# Patient Record
Sex: Male | Born: 1935 | Race: White | Hispanic: No | Marital: Married | Smoking: Former smoker
Health system: Southern US, Community
[De-identification: ages and names within clinical notes are randomized; demographics above are authoritative.]

## PROBLEM LIST (undated history)

## (undated) DIAGNOSIS — I2510 Atherosclerotic heart disease of native coronary artery without angina pectoris: Secondary | ICD-10-CM

## (undated) DIAGNOSIS — J189 Pneumonia, unspecified organism: Secondary | ICD-10-CM

## (undated) DIAGNOSIS — Z952 Presence of prosthetic heart valve: Secondary | ICD-10-CM

## (undated) DIAGNOSIS — K5792 Diverticulitis of intestine, part unspecified, without perforation or abscess without bleeding: Secondary | ICD-10-CM

## (undated) DIAGNOSIS — Z992 Dependence on renal dialysis: Secondary | ICD-10-CM

## (undated) DIAGNOSIS — Z9981 Dependence on supplemental oxygen: Secondary | ICD-10-CM

## (undated) DIAGNOSIS — I5032 Chronic diastolic (congestive) heart failure: Secondary | ICD-10-CM

## (undated) DIAGNOSIS — N186 End stage renal disease: Secondary | ICD-10-CM

## (undated) DIAGNOSIS — M1990 Unspecified osteoarthritis, unspecified site: Secondary | ICD-10-CM

## (undated) DIAGNOSIS — F419 Anxiety disorder, unspecified: Secondary | ICD-10-CM

## (undated) DIAGNOSIS — Z9289 Personal history of other medical treatment: Secondary | ICD-10-CM

## (undated) DIAGNOSIS — D649 Anemia, unspecified: Secondary | ICD-10-CM

## (undated) DIAGNOSIS — J9620 Acute and chronic respiratory failure, unspecified whether with hypoxia or hypercapnia: Secondary | ICD-10-CM

## (undated) DIAGNOSIS — Z8719 Personal history of other diseases of the digestive system: Secondary | ICD-10-CM

## (undated) DIAGNOSIS — I739 Peripheral vascular disease, unspecified: Secondary | ICD-10-CM

## (undated) DIAGNOSIS — I639 Cerebral infarction, unspecified: Secondary | ICD-10-CM

## (undated) DIAGNOSIS — IMO0001 Reserved for inherently not codable concepts without codable children: Secondary | ICD-10-CM

## (undated) DIAGNOSIS — E785 Hyperlipidemia, unspecified: Secondary | ICD-10-CM

## (undated) DIAGNOSIS — I4891 Unspecified atrial fibrillation: Secondary | ICD-10-CM

## (undated) DIAGNOSIS — J449 Chronic obstructive pulmonary disease, unspecified: Secondary | ICD-10-CM

## (undated) DIAGNOSIS — E7800 Pure hypercholesterolemia, unspecified: Secondary | ICD-10-CM

## (undated) DIAGNOSIS — I10 Essential (primary) hypertension: Secondary | ICD-10-CM

## (undated) DIAGNOSIS — J111 Influenza due to unidentified influenza virus with other respiratory manifestations: Secondary | ICD-10-CM

## (undated) DIAGNOSIS — I482 Chronic atrial fibrillation, unspecified: Secondary | ICD-10-CM

## (undated) DIAGNOSIS — Z8673 Personal history of transient ischemic attack (TIA), and cerebral infarction without residual deficits: Secondary | ICD-10-CM

## (undated) DIAGNOSIS — I509 Heart failure, unspecified: Secondary | ICD-10-CM

## (undated) DIAGNOSIS — I350 Nonrheumatic aortic (valve) stenosis: Secondary | ICD-10-CM

## (undated) HISTORY — PX: INGUINAL HERNIA REPAIR: SUR1180

## (undated) HISTORY — DX: Unspecified osteoarthritis, unspecified site: M19.90

## (undated) HISTORY — DX: Pneumonia, unspecified organism: J18.9

## (undated) HISTORY — DX: Cerebral infarction, unspecified: I63.9

## (undated) HISTORY — DX: Atherosclerotic heart disease of native coronary artery without angina pectoris: I25.10

## (undated) HISTORY — DX: Acute and chronic respiratory failure, unspecified whether with hypoxia or hypercapnia: J96.20

## (undated) HISTORY — DX: Chronic obstructive pulmonary disease, unspecified: J44.9

## (undated) HISTORY — DX: Unspecified atrial fibrillation: I48.91

## (undated) HISTORY — DX: Essential (primary) hypertension: I10

## (undated) HISTORY — PX: CATARACT EXTRACTION W/ INTRAOCULAR LENS  IMPLANT, BILATERAL: SHX1307

## (undated) HISTORY — PX: EYE SURGERY: SHX253

## (undated) HISTORY — DX: Pure hypercholesterolemia, unspecified: E78.00

## (undated) HISTORY — DX: Hyperlipidemia, unspecified: E78.5

---
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
## 1982-06-22 HISTORY — PX: APPENDECTOMY: SHX54

## 1997-06-22 DIAGNOSIS — I2510 Atherosclerotic heart disease of native coronary artery without angina pectoris: Secondary | ICD-10-CM

## 1997-06-22 HISTORY — PX: CORONARY ANGIOPLASTY WITH STENT PLACEMENT: SHX49

## 1997-06-22 HISTORY — DX: Atherosclerotic heart disease of native coronary artery without angina pectoris: I25.10

## 2010-06-22 DIAGNOSIS — Z8673 Personal history of transient ischemic attack (TIA), and cerebral infarction without residual deficits: Secondary | ICD-10-CM

## 2010-06-22 HISTORY — PX: COLECTOMY: SHX59

## 2010-06-22 HISTORY — DX: Personal history of transient ischemic attack (TIA), and cerebral infarction without residual deficits: Z86.73

## 2012-10-19 ENCOUNTER — Other Ambulatory Visit: Payer: Self-pay | Admitting: *Deleted

## 2012-10-19 ENCOUNTER — Inpatient Hospital Stay
Admission: RE | Admit: 2012-10-19 | Discharge: 2012-11-07 | Disposition: A | Discharge status: Home / Self Care | Payer: Medicare PPO | Source: Ambulatory Visit | Attending: Internal Medicine | Admitting: Internal Medicine

## 2012-10-19 MED ORDER — ALPRAZOLAM 0.25 MG PO TABS: ORAL_TABLET | ORAL | Status: DC

## 2012-10-20 ENCOUNTER — Non-Acute Institutional Stay (SKILLED_NURSING_FACILITY): Payer: Medicare PPO | Admitting: Internal Medicine

## 2012-10-20 DIAGNOSIS — J441 Chronic obstructive pulmonary disease with (acute) exacerbation: Secondary | ICD-10-CM

## 2012-10-20 DIAGNOSIS — J9620 Acute and chronic respiratory failure, unspecified whether with hypoxia or hypercapnia: Secondary | ICD-10-CM

## 2012-10-20 DIAGNOSIS — E785 Hyperlipidemia, unspecified: Secondary | ICD-10-CM

## 2012-10-20 DIAGNOSIS — I25810 Atherosclerosis of coronary artery bypass graft(s) without angina pectoris: Secondary | ICD-10-CM

## 2012-10-20 DIAGNOSIS — K5732 Diverticulitis of large intestine without perforation or abscess without bleeding: Secondary | ICD-10-CM

## 2012-10-20 DIAGNOSIS — I10 Essential (primary) hypertension: Secondary | ICD-10-CM

## 2012-10-20 DIAGNOSIS — M1990 Unspecified osteoarthritis, unspecified site: Secondary | ICD-10-CM

## 2012-10-20 DIAGNOSIS — N189 Chronic kidney disease, unspecified: Secondary | ICD-10-CM

## 2012-10-22 DIAGNOSIS — I25810 Atherosclerosis of coronary artery bypass graft(s) without angina pectoris: Secondary | ICD-10-CM | POA: Insufficient documentation

## 2012-10-22 DIAGNOSIS — E785 Hyperlipidemia, unspecified: Secondary | ICD-10-CM | POA: Insufficient documentation

## 2012-10-22 DIAGNOSIS — J449 Chronic obstructive pulmonary disease, unspecified: Secondary | ICD-10-CM | POA: Insufficient documentation

## 2012-10-22 DIAGNOSIS — I10 Essential (primary) hypertension: Secondary | ICD-10-CM | POA: Insufficient documentation

## 2012-10-22 DIAGNOSIS — M1990 Unspecified osteoarthritis, unspecified site: Secondary | ICD-10-CM

## 2012-10-22 DIAGNOSIS — N184 Chronic kidney disease, stage 4 (severe): Secondary | ICD-10-CM | POA: Insufficient documentation

## 2012-10-22 HISTORY — DX: Unspecified osteoarthritis, unspecified site: M19.90

## 2012-10-22 NOTE — Progress Notes (Signed)
Patient ID: Terry [name], male   DOB: 1935-02-15, 78 y.o.   MRN: [id number]  Chief complaint-acute visit status post hospitalization for acute respiratory failure-COPD exasperation-pneumonia.  History of present illness.  Patient is an elderly resident who presented to the hospital with increased shortness of breath and cough with chest tightness he also was noted to have some expectoration.  He was placed on antibiotic for possible left lower lobe pneumonia and admitted to the hospital.  He was treated with Levaquin.  However shortly after his admission he developed increased respiratory distress and hypercarbic respiratory failure and subsequently required transfer to the intensive care unit and BiPAP ventilation.  His symptoms improved he was weaned off BiPAP and placed on supplemental oxygen.  He was noted to have signs and symptoms of obstructive sleep apnea-recommendation for outpatient evaluation for CPAP therapy he did improve during his hospitalization and was switched to by mouth antibiotics.  He also was noted to be in acute renal failure with a creatinine of 1.79 and BUN of 25-he received IV fluids with gentle hydration-renal ultrasound was negative for obstructive etiology-patient's kidneys were consistent with severe medical renal disease and bilateral renal cysts.  Nephrology recommended conservative management and his creatinine did improve to 1.5 at discharge which appears to be his baseline.  He was also noted to have some obstructive uropathy and was started on Cardura while in the hospital-this should also help with his hypertension.  Patient was hospitalized in [place] but his wife has just taken a job in [place] and thus he is admitted to a rehabilitation facility closer to his wife  He does have a history of coronary artery disease and had a stent placed in 1998-this was stable during hospitalization he had an echocardiogram done which showed an ejection fraction  of 55-60% with normal diastolic function.  . Past medical history.  History of acute respiratory failure improved.  COPD with exasperation.  Sleep apnea suspected.  Acute renal failure complicated with chronic renal disease.  Coronary artery disease status post.  History of diverticulosis and diverticulitis status post sigmoid colectomy.  Hyperlipidemia.  History TIAs.  Osteoarthritis.  Operative procedures during hospitalization.  #1-intubation and mechanical ventilation.  2 CT scan of the chest without contrast-I did not see those results although apparently did not show any acute pathology.  #3-ultrasound of the kidneys bilateral negative for obstructive nephropathy.  Past surgical history.  Significant for status post sigmoid colectomy secondary to diverticulitis and diverticulosis.-- 2012  History appendectomy.  History of angioplasty with 2 stents in 1999  Medications.  Albuterol inhaler 2 puffs 4 times a day.  Xanax 0.25 mg twice a day when necessary anxiety.  Norvasc 10 mg daily.  Lipitor 10 mg each bedtime.  Doxazosin 4 mg each bedtime.  Advair 2 puffs twice a day.  [employer] 600 mg Mucinex 600 mg 3 times a day when necessary.  Hydrochlorothiazide 25 mg daily.  Lopressor 20 mg daily.  Terazosin 75 mg daily.  Spiriva one capsule inhaled daily  . Social history-patient is married he does have one daughter and one stepdaughter-his wife recently was transferred to [facility] from [place] is a retired Merchandiser, retail.  Does have a tobacco history--he smoked for 49 years he quit in 2001---denies significant alcohol use or illicit drug use.  Family history.  Father is deceased he did have stomach trouble and died of an MI at age 65.  Mother deceased at age 51 had cancer although unclear what type this was.  He has 2 brothers one has Parkinson's syndrome  he also has a sister with a history of cancer  Review of  systems.  General denies any fever or chills.  Skin denies issues.  Eyes denies any visual changes.  Ear nose mouth and throat-does not complain of sore throat nasal discharge or hearing difficulties.  Respiratory-extensive history as noted above but does not complaining of any increased shortness of breath from baseline is on oxygen.  Cardiac-denies any chest pain.  GU does not complaining of dysuria.  Muscle skeletal-has some weakness but denies any joint pain at this time.  Neurologic-does not complain of dizziness or headache.  Psych-does not complaining of depression does have some history of anxiety.  Physical exam.  Temperature 98.0 pulse 94 respirations 20 blood pressure 150/70-149/76-O2 saturation in the 90s on oxygen.  In general this is a frail elderly male in no distress lying comfortably in bed.  The skin is warm and dry he does have some violaceous bruising of his abdomen.  Eyes pupils appear equal round react to light sclera and conjunctiva are clear.  Oropharynx is clear mucous membranes moist he does have numerous extractions.  Chest he has reduced breath sounds but no rhonchi rales or wheezes no labored breathing.  Heart distant heart sounds radial pulses regular he continues with  edema 2+ lower extremity edema positive pedal pulses  Abdomen is protuberant soft nontender there is a well-healed surgical scar bowel sounds are positive.  Muscle skeletal moves all extremities x4 actually is able to ambulate I do not note any deformities.  Neurologic is grossly intact cranial nerves intact speech is clear I do not see any lateralizing findings.  Psychiatric alert and oriented x3 pleasant and appropriate.  Labs.  10/16/2012.  WBC 13.7 hemoglobin 10.3 platelets 199.  Sodium 136 potassium 3.9 BUN 37 creatinine 1.59.  Albumin 3.1 on 10/07/2012.  25th 2014.  WBC 15.1 hemoglobin 10.2 platelets 2:30.  Assessment and plan.  #1-history respiratory  failure with COPD-at this point appear stable he is on a significant amount of oxygen--nonetheless clinically he appears to be relatively stable he continues on inhalers-also Mucinex-he is motivated to go home and is up and walking.  #2-history of chronic kidney disease-most recent creatinine actually shows improvement will update a metabolic panel first [facility] next week.  #3-hypertension at this point systolic somewhat elevated but we do not have a whole lot of readings we'll continue to monitor for now he is on Norvasc as well as hydrochlorothiazide and metoprolol--as well as Cardura.  #4-history of obstructive uropathy-he is on Cardura this was started in the hospital I note he is also on Hytrin Will DC the Hytrin secondary to duplicate therapy.  #5-hyperlipidemia-continues on a statin since his stay here is going to be relatively short I suspect will not be aggressive and defer to primary care provider to follow his lipid panel.  #6-anxiety-at this point appear stable he does have Ativan when necessary twice a day  #7.  Coronary artery disease-at this point appears asymptomatic he is on a statin at also continues on Cardura metoprolol hydrochlorothiazide as well as Norvasc for blood pressure control--- question starting anticoagulant-Will await Dr. Janalyn Rouse input.  #8-history of diverticulitsi--this appears to be stable  #9-anemia-will update CBC the first lab day next week  #10 osteoarthritis-at this point appears to be stable he does not complain of joint pain today  [id number]-of note greater than 50 minutes spent assessing patient-review of hospital records-and formulating plan of care for  numerous diagnoses

## 2012-10-24 ENCOUNTER — Non-Acute Institutional Stay (SKILLED_NURSING_FACILITY): Payer: Medicare PPO | Admitting: Internal Medicine

## 2012-10-24 DIAGNOSIS — J441 Chronic obstructive pulmonary disease with (acute) exacerbation: Secondary | ICD-10-CM

## 2012-10-24 DIAGNOSIS — I10 Essential (primary) hypertension: Secondary | ICD-10-CM

## 2012-10-24 DIAGNOSIS — I509 Heart failure, unspecified: Secondary | ICD-10-CM

## 2012-10-24 DIAGNOSIS — N189 Chronic kidney disease, unspecified: Secondary | ICD-10-CM

## 2012-10-24 NOTE — Progress Notes (Signed)
Patient ID: Terry [name], male   DOB: 1935/05/31, 78 y.o.   MRN: [id number] Chief complaint; this is an admission to SNF close a hospital [facility] apparently a branch of [facility]. Patient was in acute care from 10/07/2012 through 10/18/2012  History; patient was admitted for treatment of community-acquired pneumonia. On the day of admission, he developed increasing respiratory distress and needed to be transferred to the ICU for BiPAP. He was continued on BiPAP overnight and continued in the ICU for 48 hours. His respiratory status stabilized. He was weaned off BiPAP and placed on supplemental oxygen. He has known COPD and was also felt to have symptoms and signs of obstructive sleep apnea. It was suggested he have an outpatient polysomnogram. His pneumonia was in the left lower lobe and he is discharged on Levaquin. Other medical issues in the hospital included stable coronary artery disease, status post stenting in 1998. Echocardiogram showed an ejection fraction of 55-60% with normal diastolic function. The patient did not have any bowel abnormalities. The patient also developed acute renal failure in the hospital and his initial BUN and creatinine were noted to be 25 and 1.7. At discharge. This was 37 and 1.5, respectively. He was felt to require a stay in SNF due to deconditioning.  Past medical history; #1 status post acute respiratory failure secondary to hypercarbic respiratory failure. Problem #2 COPD exacerbation. #3 left lobe pneumonia. #4 acute renal failure. #5 chronic renal failure stage II. #6 hyponatremia resolved. #7 coronary artery disease, status post stent remotely. #8 history of diverticulosis, closes and diverticulitis, status post sigmoid colectomy. #9 hyperlipidemia #10. Transient ischemic attacks #11. Osteoarthritis  Medications; albuterol 90 mcg 2 puffs 4 times a day, amlodipine 10 by mouth daily, atorvastatin 10 one by mouth each bedtime, doxazosin  4 mg, 1 by mouth each bedtime, Advair HFA 115/21 2 puffs twice a day, hydrochlorothiazide, 25 one by mouth daily, metoprolol XL 100, one by mouth daily, terazosin 5 mg, one by mouth daily, Spiriva 18 mcg 1 tab daily, O2 5 L per minute via nasal cannula.  Social history; patient states he was not on home O2 prior to this which seems in contradistinction to what is said in the discharge summary. He is a remote smoker. Her laboratory assist device. His family is read locating to the [place] area, therefore, he came here for rehabilitation. Family history none according the patient.  Review of systems; Respiratory shortness of breath on exertion no cough HEENT complains of oral pain due to thrush, which was treated in the hospital. Cardiac no exertional chest pain, but notes increasing edema. Abdomen no abnormal pain, nausea, vomiting, or dysphagia.  Physical exam, temperature 90.7, 8, pulse 76, rests 18, BP 122/66. O2 sat is 98%. Weight is 189 pounds, 191.2 on admission  HEENT I see no evidence of thrush although his tongue is sensitive, no lesions seen. Respiratory slight stridor, but no wheezing. Air entry is reduced. Cardiac heart sounds are normal. No murmurs JVP. PE is elevated, 2+ to 3+ edema below his knees 3+ coccyx edema. Abdomen distended. No tenderness. No masses. No liver no spleen. No shifting dominance. Extremities 2-3+ pitting edema below the knees. No signs of a DVT.  Impression/problem. #1 at least right-sided heart heart failure, and echocardiogram as noted, showing an EF of 55-60% with normal diastolic function. There was no valve abnormalities. No "it right-sided pressures, however. #2 chronic renal failure. Labs today show a BUN of 26 and a creatinine of 1.89  vs. 37 and 1.5 at discharge. The latter was felt to be his discharge values #3 hypertension, which was felt to be stable. #4 coronary artery disease, not felt to be unstable. No current complaints. #5 COPD with  component of possible obstructive. He was discharged on high flow oxygen, which he currently probably does not need. [place] as stated in the discharge summary that he was on home O2 , The patient does not verify this. I will taper his oxygen and get a blood gas. He tells me he recently had PFTs done in [place]. As his grafts in the last month to 2. #6 BPH started on Cardura. #7 very significant lower extremity and coccyx edema, I think this is right-sided heart failure. I will substitute Lasix for the thiazide. #8 hyponatremia. This will need to be followed #9 acute on chronic respiratory failure, which at least one part of the discharge summary says intubation whereas the verbiage suggest he only received BiPAP. In any case, felt to be secondary to COPD and possibly obstructive sleep apnea. #10. Question obstructive sleep apnea, a outpatient polysomnogram was suggested  I am going to taper his oxygen, start him on Lasix, follow his renal function. He will need a blood gas at some point. It would be nice to see his PFTs, which were apparently only recently done.

## 2012-10-31 ENCOUNTER — Non-Acute Institutional Stay (SKILLED_NURSING_FACILITY): Payer: Medicare PPO | Admitting: Internal Medicine

## 2012-10-31 DIAGNOSIS — I509 Heart failure, unspecified: Secondary | ICD-10-CM

## 2012-10-31 DIAGNOSIS — J441 Chronic obstructive pulmonary disease with (acute) exacerbation: Secondary | ICD-10-CM

## 2012-10-31 NOTE — Progress Notes (Signed)
Patient ID: Terry [name], male   DOB: 11/25/1934, 78 y.o.   MRN: [id number] Chief complaint; review of hypoxemia, chronic renal insufficiency.  History; the patient came to [place] from admission to a hospital in [facility]. He was admitted to the ICU for pneumonia a period required either BiPAP or, invitation I'm not exactly sure, however, most of the records seems to say , BiPAP. He came to [place] on high flow oxygen at 5 L per minute. Although the discharge summary is not definitive on this, the patient states that he was not on home O2. Apparently, with activity he desaturates therefore, we have not been able to wean his oxygen. He is listed as having both COPD and probable obstructive sleep apnea. He is on albuterol inhaler and Advair HFA.[name] the patient states he feels fine with no shortness of breath or cough. He apparently recently had PFTs done by a pulmonologist in [place], Dr. Rosana Hoes  Review of systems;  Respiratory exertional shortness of breath But no cough no wheezing. Cardiac no exertional chest pain. GI no abdominal pain, nausea, or vomiting.  Physical exam; at rest. His sats were 99% on 5 L. Pulse rate 96, respirations 18, with activity, probably 70 yards of walking at a slow pace.[name] He got down to 93% on 5 L. He was tachycardic in the 130s, I suspect that this is his maximal exercise tolerance.  Respiratory reasonably clear entry bilaterally. No wheezing. Cardiac heart sounds are normal. Much less edema and he has lost 8 pounds in a week since starting Lasix 20.  Abdomen no tenderness. No mass.  Impression/plan Hypoxemia felt in hospital to be secondary to COPD, and possibly obstructive sleep apnea. I am going to taper the oxygen. I suspect he has about a 70 your exercise tolerance. Like to do a blood gas on him and I would like to see his recently done PFTs, which I will request. #2 at least right-sided heart failure. He had an echocardiogram done in the hospital, but I did not  actually see the numbers, especially the right-sided members. His JVP was elevated in the 3+ edema to below his knees. On arrival.

## 2012-11-02 ENCOUNTER — Non-Acute Institutional Stay (SKILLED_NURSING_FACILITY): Payer: Medicare PPO | Admitting: Internal Medicine

## 2012-11-02 DIAGNOSIS — J441 Chronic obstructive pulmonary disease with (acute) exacerbation: Secondary | ICD-10-CM

## 2012-11-02 DIAGNOSIS — I509 Heart failure, unspecified: Secondary | ICD-10-CM

## 2012-11-02 NOTE — Progress Notes (Signed)
Patient ID: Terry [name], male   DOB: 11/11/1934, 78 y.o.   MRN: [id number]                  Patient ID: Terry [name], male   DOB: [birth year], 78 y.o.   MRN: [id number] Chief complaint; review of hypoxemia, chronic renal insufficiency.  History; the patient came to [place] from admission to a hospital in [facility]. He was admitted to the ICU for pneumonia and for a  period required either BiPAP or intubation, I'm not exactly sure, however, most of the records seems to say , BiPAP. He came to [place] on high flow oxygen at 5 L per minute. Although the discharge summary is not definitive on this, the patient states that he was not on home O2. Apparently, with activity he desaturates therefore, we have not been able to wean his oxygen. He is listed as having both COPD and probable obstructive sleep apnea. He is on albuterol inhaler and Advair HFA.[name] the patient states he feels fine with no shortness of breath or cough. He apparently recently had PFTs done by a pulmonologist in [place], Dr. Rosana Hoes. Last visit I ambulated him about 70 yards, he did well. We have now tapered his O2 to off. He sats at 93% on room air, 88% on room air with about 70 yards of walking a a slow pace.    Review of systems;   Respiratory exertional shortness of breath But no cough no wheezing. Cardiac no exertional chest pain. GI no abdominal pain, nausea, or vomiting.  Physical exam; at rest. His sats were 93% on room air. Pulse rate 96, respirations 18, with activity, probably 70 yards of walking at a slow pace.[name] He got down to 88% on room air. He was tachycardic in the 130s, I suspect that this is his maximal exercise tolerance.   Respiratory reasonably clear entry bilaterally. No wheezing. Cardiac heart sounds are normal. Much less edema and he has lost 8 pounds in a week since starting Lasix 20.     Impression/plan Hypoxemia felt in hospital to be secondary to COPD, and possibly obstructive sleep apnea. I have tapered  the oxygen. I suspect he has about a 70 your exercise tolerance. I have ordered a blood gas on room air. He has already had PFT's done in [place].  #2 at least right-sided heart failure. He had an echocardiogram done in the hospital, but I did not actually see the numbers, especially the right-sided members. His JVP was elevated in the 3+ edema to below his knees. On arrival. This is all much better. #3 ?sleep apnea: out patient sleep study will be ordered.   I don't believe he will need chronic O2 at this point. Suspect he has pulmary hypertension. Blood gas on room air. Hopefully can review PFT's

## 2012-11-03 ENCOUNTER — Ambulatory Visit (HOSPITAL_COMMUNITY)
Admit: 2012-11-03 | Discharge: 2012-11-03 | Disposition: A | Discharge status: Home / Self Care | Payer: Medicare PPO | Source: Ambulatory Visit | Attending: Internal Medicine | Admitting: Internal Medicine

## 2012-11-03 ENCOUNTER — Non-Acute Institutional Stay (SKILLED_NURSING_FACILITY): Payer: Medicare PPO | Admitting: Internal Medicine

## 2012-11-03 DIAGNOSIS — I10 Essential (primary) hypertension: Secondary | ICD-10-CM

## 2012-11-03 DIAGNOSIS — J449 Chronic obstructive pulmonary disease, unspecified: Secondary | ICD-10-CM | POA: Insufficient documentation

## 2012-11-03 DIAGNOSIS — R0609 Other forms of dyspnea: Secondary | ICD-10-CM | POA: Insufficient documentation

## 2012-11-03 DIAGNOSIS — M1990 Unspecified osteoarthritis, unspecified site: Secondary | ICD-10-CM

## 2012-11-03 DIAGNOSIS — R05 Cough: Secondary | ICD-10-CM

## 2012-11-03 DIAGNOSIS — N189 Chronic kidney disease, unspecified: Secondary | ICD-10-CM

## 2012-11-03 DIAGNOSIS — E785 Hyperlipidemia, unspecified: Secondary | ICD-10-CM

## 2012-11-03 DIAGNOSIS — J9620 Acute and chronic respiratory failure, unspecified whether with hypoxia or hypercapnia: Secondary | ICD-10-CM

## 2012-11-03 DIAGNOSIS — I25810 Atherosclerosis of coronary artery bypass graft(s) without angina pectoris: Secondary | ICD-10-CM

## 2012-11-03 DIAGNOSIS — R059 Cough, unspecified: Secondary | ICD-10-CM

## 2012-11-03 DIAGNOSIS — J4489 Other specified chronic obstructive pulmonary disease: Secondary | ICD-10-CM | POA: Insufficient documentation

## 2012-11-03 DIAGNOSIS — J441 Chronic obstructive pulmonary disease with (acute) exacerbation: Secondary | ICD-10-CM

## 2012-11-03 DIAGNOSIS — R0989 Other specified symptoms and signs involving the circulatory and respiratory systems: Secondary | ICD-10-CM | POA: Insufficient documentation

## 2012-11-03 LAB — BLOOD GAS, ARTERIAL
FIO2: 21 %
TCO2: 23.6 mmol/L (ref 0–100)
pCO2 arterial: 35.2 mmHg (ref 35.0–45.0)
pH, Arterial: 7.473 — ABNORMAL HIGH (ref 7.350–7.450)
pO2, Arterial: 70 mmHg — ABNORMAL LOW (ref 80.0–100.0)

## 2012-11-06 NOTE — Progress Notes (Signed)
Patient ID: Terry [name], male   DOB: 12-12-1934, 78 y.o.   MRN: [id number] This is an acute visit.  [facility]  [facility] skilled    Chief complaint-acute visit for tentative discharge .  History of present illness.  Patient is an elderly resident who presented to the hospital with increased shortness of breath and cough with chest tightness he also was noted to have some expectoration.  He was placed on antibiotic for possible left lower lobe pneumonia and admitted to the hospital.  He was treated with Levaquin.  However shortly after his admission he developed increased respiratory distress and hypercarbic respiratory failure and subsequently required transfer to the intensive care unit and BiPAP ventilation.  His symptoms improved he was weaned off BiPAP and placed on supplemental oxygen.  He was noted to have signs and symptoms of obstructive sleep apnea-recommendation for outpatient evaluation for CPAP therapy he did improve during his hospitalization and was switched to by mouth antibiotics.  He also was noted to be in acute renal failure with a creatinine of 1.79 and BUN of 25-he received IV fluids with gentle hydration-renal ultrasound was negative for obstructive etiology-patient's kidneys were consistent with severe medical renal disease and bilateral renal cysts.  Nephrology recommended conservative management and his creatinine did improve to 1.5 --however per recent lab it has risen to 1.9 an update metabolic panel is pending first Lab  dae next week   Also during his hospitalization He was also noted to have some obstructive uropathy and was started on Cardura while in the hospita.  Patient was hospitalized in [place] but his wife has just taken a job in [place] and thus he is admitted to a rehabilitation facility closer to his wife  He does have a history of coronary artery disease and had a stent placed in 1998-this was stable during hospitalization he  had an echocardiogram done which showed an ejection fraction of 55-60% with normal diastolic function. He will be going home to a house near [name] is married his wife is still working in the Audiological scientist apparently would not be home all the time-however he appears to be functioning relatively well doing his  ADLs and walking quite well  He is no longer on oxygen chronically-and is very motivated to go home.  In regards to edema this is significantly better he is on Lasix again we will have to watch his renal function     . Past medical history.  History of acute respiratory failure improved.  COPD with exasperation.  Sleep apnea suspected.  Acute renal failure complicated with chronic renal disease.  Coronary artery disease status post.  History of diverticulosis and diverticulitis status post sigmoid colectomy.  Hyperlipidemia.  History TIAs.  Osteoarthritis.    Operative procedures during hospitalization.  #1-intubation and mechanical ventilation.  2 CT scan of the chest without contrast-I did not see those results although apparently did not show any acute pathology.  #3-ultrasound of the kidneys bilateral negative for obstructive nephropathy.   Past surgical history.  Significant for status post sigmoid colectomy secondary to diverticulitis and diverticulosis.-- 2012  History appendectomy.  History of angioplasty with 2 stents in 1999   Medications.  Albuterol inhaler 2 puffs 4 times a day.  Xanax 0.25 mg twice a day when necessary anxiety.  Norvasc 10 mg daily.  Lipitor 10 mg each bedtime.  Doxazosin 4 mg each bedtime.  Advair 2 puffs twice a day.   Mucinex 600 mg 3 times  a day when necessary. .  Toprol XL 100 mg QD.  Terazosin 75 mg daily.  Spiriva one capsule inhaled daily   . Social history-patient is married he does have one daughter and one stepdaughter-his wife recently was transferred to [facility] from [place] is a retired Merchandiser, retail.   Does have a tobacco history--he smoked for 49 years he quit in 2001---denies significant alcohol use or illicit drug use .  Family history.  Father is deceased he did have stomach trouble and died of an MI at age 41.  Mother deceased at age 89 had cancer although unclear what type this was.  He has 2 brothers one has Parkinson's syndrome  he also has a sister with a history of cancer   Review of systems.  General denies any fever or chills.  Skin denies issues.  Eyes denies any visual changes.  Ear nose mouth and throat-he does not complainof sore throat or t nasal discharge or hearing difficulties.  Respiratory-extensive history as noted above but does not complaining of any increased shortness of breath from baseline is on oxygen.--hAs an intermittent cough  Cardiac-denies any chest pain.  GU does not complaining of dysuria.  Muscle skeletal--denies joint pain-has gained significant strength during his rehabilitation here.  Neurologic-does not complain of dizziness or headache.  Psych-does not complaining of depression does have some history of anxiety.   Physical exam.  Temperature 98.7 pulse 84 respirations 20 blood pressure 141/69 O2 saturation in the 90s on room air weight is 178 this is down about 13 pounds since his admission.  In general this is a frail elderly male in no distess sitting comfortably in his chair.  The skin is warm and dry he does have some violaceous bruising of his abdomen.  Eyes pupils appear equal round react to light sclera and conjunctiva are clear.  Oropharynx is clear mucous membranes moist he does have numerous extractions.  Chest he has reduced breath sounds but no rhonchi rales or wheezes no labored breathing.  Heart--regular rate and rhythm with occasional irregular beat-his edema is significantly decreased compared to admission exam  Abdomen is protuberant soft nontender there is a well-healed surgical scar bowel sounds are positive.  Muscle  skeletal moves all extremities x4 actually is able to ambulate I do not note any deformities-he does ambulate without apparent difficulty he gets up from his chair easily and walks.  Neurologic is grossly intact cranial nerves intact speech is clear I do not see any lateralizing findings.  Psychiatric alert and oriented x3 pleasant and appropriate.   Labs. 11/02/2012.  Sodium 134 potassium 4.3 BUN 15 creatinine 1.90.  10/26/2012.  Iron 27-total iron binding capacity 209--ferritin-522.  10/24/2012.  WBC 8.7 hemoglobin 8.5 platelets 157  10/16/2012.  WBC 13.7 hemoglobin 10.3 platelets 199.  Sodium 136 potassium 3.9 BUN 37 creatinine 1.59.  Albumin 3.1 on 10/07/2012.  25th 2014.  WBC 15.1 hemoglobin 10.2 platelets 2:30 .  Assessment and plan.   #1-history respiratory failure with COPD-this appears improved-Dr. Dellia Nims has ordered arterial blood gases which he will review next week before discharge-he is very motivated to go home appears to be stable and improved  since his admission  #2-history of chronic kidney disease-creatinine has risen something-however edema is significantly improved-I suspect this will continue to be a balancing act and and updated metabolic panel is pending for first laboratory day next week  #3-hypertension - he is on Norvasc as well as Toproll--as well as Cardura-lisinopril--systolics still elevated at times continue  to monitor--hesitant to change medication right before discharge.  #4-history of obstructive uropathy-he is on Cardura this appears to be stable  #5-hyperlipidemia-continues on a statin -his stay here has been quite short will defer to primary care provider to follow his lipid panel.  #6-anxiety-at this point appear stable he does haveXanax when necessary twice a day  #7.  Coronary artery disease-at this point appears asymptomatic he is on a statin at also continues on Cardura and Toprol as well as Norvasc and lisinopril for blood pressure  control---.  #8-history of diverticulitsi--this appears to be stable  #9-anemia-likely chronic disease k  #10 osteoarthritis-at this point appears to be stable he does not complain of joint pain today  Number 11-cough-he says this is intermittent and not new-he would like lozanges will order these when necessary   TA:[id number]

## 2012-11-07 ENCOUNTER — Non-Acute Institutional Stay (SKILLED_NURSING_FACILITY): Payer: Medicare PPO | Admitting: Internal Medicine

## 2012-11-07 DIAGNOSIS — J441 Chronic obstructive pulmonary disease with (acute) exacerbation: Secondary | ICD-10-CM

## 2012-11-07 DIAGNOSIS — N189 Chronic kidney disease, unspecified: Secondary | ICD-10-CM

## 2012-11-07 DIAGNOSIS — I509 Heart failure, unspecified: Secondary | ICD-10-CM

## 2012-11-07 NOTE — Progress Notes (Signed)
Patient ID: Terry [name], male   DOB: 1935/01/13, 78 y.o.   MRN: [id number]                  Patient ID: Terry [name], male   DOB: Sep 18, 1934, 78 y.o.   MRN: [id number] Chief complaint; pre discharge review  History; the patient came to [place] from admission to a hospital in [facility]. He was admitted to the ICU for pneumonia a period required either BiPAP or, invitation I'm not exactly sure, however, most of the records seems to say , BiPAP. He came to [place] on high flow oxygen at 5 L per minute. Although the discharge summary states he was on home O2, the patient states that he was not on home O2. We had some difficulty weaning his O2 however at discharge his sats on room air on 96% at rest. He has about a 70 yard exercise tolerance.[name] He is listed as having both COPD and probable obstructive sleep apnea. He is on albuterol inhaler and Advair HFA.[name] the patient states he feels fine with no shortness of breath or cough. He apparently recently had PFTs done by a pulmonologist in [place], Dr. Rosana Hoes. I have been unable to obtain these results. He also had an echo during his index hosptialization, he has a normal EF however his Right sided values were no quoted. On room air Ph 7.474 Pco35.2 Po2 70 Hco3 25.5 prior to discharge.   Review of systems;  diuresed from191.2 ri 173.5 on 20 mg of lasix Respiratory exertional shortness of breath But no cough no wheezing. Cardiac no exertional chest pain. GI no abdominal pain, nausea, or vomiting.  Physical exam; at rest. His sats were 96% 97.4 93 20 148/82  Respiratory reasonably clear entry bilaterally. No wheezing. Cardiac heart sounds are normal. monimal edema. JVP not elevated.    Abdomen no tenderness. No mass.  Impression/plan Hypoxemia felt in hospital to be secondary to COPD, and possibly obstructive sleep apnea. Oxygen is off. I suspect he has about a 70 your exercise tolerance. #2 at least right-sided heart failure. He had an echocardiogram  done in the hospital, but I did not actually see the numbers, especially the right-sided members. His JVP was elevated in the 3+ edema to below his knees.  #3 Sleep apnea. Will need outpatient study, will leAVE to primary physician to arrnage.  #4 CRF discharge cr 1.90  >30 minutes spent

## 2012-11-10 ENCOUNTER — Ambulatory Visit: Payer: Medicare PPO | Attending: Internal Medicine | Admitting: Physical Therapy

## 2012-11-10 DIAGNOSIS — IMO0001 Reserved for inherently not codable concepts without codable children: Secondary | ICD-10-CM | POA: Insufficient documentation

## 2012-11-10 DIAGNOSIS — M6281 Muscle weakness (generalized): Secondary | ICD-10-CM | POA: Insufficient documentation

## 2012-11-10 DIAGNOSIS — R5381 Other malaise: Secondary | ICD-10-CM | POA: Insufficient documentation

## 2012-11-16 ENCOUNTER — Ambulatory Visit: Payer: Medicare PPO | Admitting: Physical Therapy

## 2012-11-22 ENCOUNTER — Ambulatory Visit: Payer: Medicare PPO | Attending: Internal Medicine | Admitting: Physical Therapy

## 2012-11-22 DIAGNOSIS — R5381 Other malaise: Secondary | ICD-10-CM | POA: Insufficient documentation

## 2012-11-22 DIAGNOSIS — IMO0001 Reserved for inherently not codable concepts without codable children: Secondary | ICD-10-CM | POA: Insufficient documentation

## 2012-11-22 DIAGNOSIS — M6281 Muscle weakness (generalized): Secondary | ICD-10-CM | POA: Insufficient documentation

## 2012-11-28 ENCOUNTER — Telehealth: Payer: Self-pay | Admitting: Internal Medicine

## 2012-11-28 NOTE — Telephone Encounter (Signed)
Received 65 pages of records from [facility] for Dr. Melvyn Novas on 11/25/2012 asw  Sent up for Dr. Melvyn Novas to review on 11/28/2012  asw

## 2012-11-29 ENCOUNTER — Other Ambulatory Visit (INDEPENDENT_AMBULATORY_CARE_PROVIDER_SITE_OTHER): Payer: Medicare PPO

## 2012-11-29 ENCOUNTER — Ambulatory Visit (INDEPENDENT_AMBULATORY_CARE_PROVIDER_SITE_OTHER): Payer: Medicare PPO | Admitting: Internal Medicine

## 2012-11-29 ENCOUNTER — Ambulatory Visit (INDEPENDENT_AMBULATORY_CARE_PROVIDER_SITE_OTHER)
Admission: RE | Admit: 2012-11-29 | Discharge: 2012-11-29 | Disposition: A | Discharge status: Home / Self Care | Payer: Medicare PPO | Source: Ambulatory Visit | Attending: Internal Medicine | Admitting: Internal Medicine

## 2012-11-29 ENCOUNTER — Encounter: Payer: Self-pay | Admitting: Internal Medicine

## 2012-11-29 VITALS — BP 132/60 | HR 108 | Temp 97.2°F | Ht 66.0 in | Wt 181.2 lb

## 2012-11-29 DIAGNOSIS — N183 Chronic kidney disease, stage 3 unspecified: Secondary | ICD-10-CM

## 2012-11-29 DIAGNOSIS — J441 Chronic obstructive pulmonary disease with (acute) exacerbation: Secondary | ICD-10-CM

## 2012-11-29 DIAGNOSIS — I10 Essential (primary) hypertension: Secondary | ICD-10-CM

## 2012-11-29 LAB — CBC WITH DIFFERENTIAL/PLATELET
Basophils Absolute: 0.1 10*3/uL (ref 0.0–0.1)
Basophils Relative: 0.9 % (ref 0.0–3.0)
Eosinophils Absolute: 0.2 10*3/uL (ref 0.0–0.7)
HCT: 32.7 % — ABNORMAL LOW (ref 39.0–52.0)
Hemoglobin: 11.1 g/dL — ABNORMAL LOW (ref 13.0–17.0)
Lymphocytes Relative: 21.3 % (ref 12.0–46.0)
Lymphs Abs: 1.9 10*3/uL (ref 0.7–4.0)
MCHC: 34 g/dL (ref 30.0–36.0)
MCV: 90.6 fl (ref 78.0–100.0)
Monocytes Absolute: 0.9 10*3/uL (ref 0.1–1.0)
Neutro Abs: 5.7 10*3/uL (ref 1.4–7.7)
RBC: 3.61 Mil/uL — ABNORMAL LOW (ref 4.22–5.81)
RDW: 16 % — ABNORMAL HIGH (ref 11.5–14.6)

## 2012-11-29 LAB — BASIC METABOLIC PANEL
CO2: 29 mEq/L (ref 19–32)
Calcium: 8.9 mg/dL (ref 8.4–10.5)
Chloride: 101 mEq/L (ref 96–112)
Glucose, Bld: 107 mg/dL — ABNORMAL HIGH (ref 70–99)
Potassium: 4.2 mEq/L (ref 3.5–5.1)
Sodium: 136 mEq/L (ref 135–145)

## 2012-11-29 NOTE — Patient Instructions (Addendum)
Stop lisinopril for now  Please remember to go to the lab and x-ray department downstairs for your tests - we will call you with the results when they are available.    See Tammy NP w/in 2 weeks with all your medications, even over the counter meds, separated in two separate bags, the ones you take no matter what vs the ones you stop once you feel better and take only as needed when you feel you need them.   Tammy  will generate for you a new user friendly medication calendar that will put [place] all on the same page re: your medication use.     Without this process, it simply isn't possible to assure that we are providing  your outpatient care  with  the attention to detail we feel you deserve.   If we cannot assure that you're getting that kind of care,  then we cannot manage your problem effectively from this clinic.  Once you have seen Tammy and we are sure that we're all on the same page with your medication use she will arrange follow up with me.

## 2012-11-29 NOTE — Progress Notes (Signed)
  Subjective:    Patient ID: Terry [name], male    DOB: 01-Dec-1934  MRN: [id number]  HPI   62 yowm quit smoking 2001 bothered by doe seemed better then worse in 2010 p "lung infection" and stayed on advair/spiriva  Combo since 2012 seemed better then admitted in [place] with pna 10/09/12  in then transferred to [facility] x 3 weeks and discharged  On May 1st referred to pulmonary by [facility] for copd eval.   11/29/2012 1st pulmonary ov/ Matsuko Kretz/ on ACEi/ cc doe x maybe a block of walking but not back to baseline and able to do 5 steps and has proaire but not needing. Also persistent dry daytime cough x months  No obvious daytime variabilty or excess mucus production or cp or chest tightness, subjective wheeze overt sinus or hb symptoms. No unusual exp hx or h/o childhood pna/ asthma or premature birth to his knowledge.   Sleeping ok without nocturnal  or early am exacerbation  of respiratory  c/o's or need for noct saba. Also denies any obvious fluctuation of symptoms with weather or environmental changes or other aggravating or alleviating factors except as outlined above     Review of Systems  Constitutional: Negative for fever and unexpected weight change.  HENT: Negative for ear pain, nosebleeds, congestion, sore throat, rhinorrhea, sneezing, trouble swallowing, dental problem, postnasal drip and sinus pressure.   Eyes: Negative for redness and itching.  Respiratory: Positive for cough and shortness of breath. Negative for chest tightness and wheezing.   Cardiovascular: Positive for leg swelling. Negative for palpitations.  Gastrointestinal: Negative for nausea and vomiting.  Genitourinary: Negative for dysuria.  Musculoskeletal: Negative for joint swelling.  Skin: Negative for rash.  Neurological: Negative for headaches.  Hematological: Does not bruise/bleed easily.  Psychiatric/Behavioral: Negative for dysphoric mood. The patient is not nervous/anxious.        Objective:   Physical Exam  slt hoarse wm nad  Wt Readings from Last 3 Encounters:  11/29/12 181 lb 3.2 oz (82.192 kg)     HEENT mild turbinate edema.  Oropharynx no thrush or excess pnd or cobblestoning.  No JVD or cervical adenopathy. Mild accessory muscle hypertrophy. Trachea midline, nl thryroid. Chest was hyperinflated by percussion with diminished breath sounds and moderate increased exp time without wheeze. Hoover sign positive at mid inspiration. Regular rate and rhythm without murmur gallop or rub or increase P2 or edema.  Abd: no hsm, nl excursion. Ext warm without cyanosis or clubbing.    CXR  11/29/2012 :    1. Emphysema.  2. Mild bibasilar atelectasis.  3. Atherosclerosis.  4. Borderline cardiomegaly.      Assessment & Plan:

## 2012-11-30 ENCOUNTER — Telehealth: Payer: Self-pay | Admitting: Internal Medicine

## 2012-11-30 NOTE — Assessment & Plan Note (Addendum)
DDX of  difficult airways managment all start with A and  include Adherence, Ace Inhibitors, Acid Reflux, Active Sinus Disease, Alpha 1 Antitripsin deficiency, Anxiety masquerading as Airways dz,  ABPA,  allergy(esp in young), Aspiration (esp in elderly), Adverse effects of DPI,  Active smokers, plus two Bs  = Bronchiectasis and Beta blocker use..and one C= CHF  Adherence is always the initial "prime suspect" and is a multilayered concern that requires a "trust but verify" approach in every patient - starting with knowing how to use medications, especially inhalers, correctly, keeping up with refills and understanding the fundamental difference between maintenance and prns vs those medications only taken for a very short course and then stopped and not refilled.  He is very shaky on concept of medication so he may prove to be a patient where less is more.   To keep things simple, I have asked the patient to first separate medicines that are perceived as maintenance, that is to be taken daily "no matter what", from those medicines that are taken on only on an as-needed basis and I have given the patient examples of both, and then return to see our NP to generate a  detailed  medication calendar which should be followed until the next physician sees the patient and updates it.    ? Acei contributing to cough and sob > see hbp  ? Adverse effect of advair > not sure he really needs this and spiriva, try off and return for pfts  ? Beta blocker effect > consider off lopressor and on bisoprolol at next ov if not better.    Each maintenance medication was reviewed in detail including most importantly the difference between maintenance and as needed and under what circumstances the prns are to be used.  Please see instructions for details which were reviewed in writing and the patient given a copy.

## 2012-11-30 NOTE — Telephone Encounter (Signed)
Returning call can be reached at [phone].Elnita Maxwell

## 2012-11-30 NOTE — Telephone Encounter (Signed)
Returned patients call, patient calling for cxr results and has now already been informed of them Nothing further needed at this time

## 2012-11-30 NOTE — Assessment & Plan Note (Addendum)
ACE inhibitors are problematic in  pts with airway complaints because  even experienced pulmonologists can't always distinguish ace effects from copd/asthma.  By themselves they don't actually cause a problem, much like oxygen can't by itself start a fire, but they certainly serve as a powerful catalyst or enhancer for any "fire"  or inflammatory process in the upper airway, be it caused by an ET  tube or more commonly reflux (especially in the obese or pts with known GERD or who are on biphoshonates).      Stop acei and regroup with all meds to revise/ simplify bp rx when returns

## 2012-12-01 ENCOUNTER — Ambulatory Visit: Payer: Medicare PPO | Admitting: *Deleted

## 2012-12-05 ENCOUNTER — Telehealth: Payer: Self-pay | Admitting: Family Medicine

## 2012-12-05 NOTE — Telephone Encounter (Signed)
Patient is new to the area and would like to establish care and have medications refilled.  Scheduled appt with Dr. Ernestina Patches for 12/08/12.

## 2012-12-06 ENCOUNTER — Ambulatory Visit: Payer: Medicare PPO

## 2012-12-08 ENCOUNTER — Encounter: Payer: Self-pay | Admitting: Family Medicine

## 2012-12-08 ENCOUNTER — Ambulatory Visit (INDEPENDENT_AMBULATORY_CARE_PROVIDER_SITE_OTHER): Payer: Medicare PPO | Admitting: Family Medicine

## 2012-12-08 ENCOUNTER — Ambulatory Visit: Payer: Medicare PPO | Admitting: Physical Therapy

## 2012-12-08 VITALS — BP 159/85 | HR 84 | Temp 98.0°F | Ht 66.0 in | Wt 180.0 lb

## 2012-12-08 DIAGNOSIS — N183 Chronic kidney disease, stage 3 unspecified: Secondary | ICD-10-CM

## 2012-12-08 DIAGNOSIS — J449 Chronic obstructive pulmonary disease, unspecified: Secondary | ICD-10-CM

## 2012-12-08 MED ORDER — DOXAZOSIN MESYLATE 4 MG PO TABS: 4.0000 mg | ORAL_TABLET | Freq: Every day | ORAL | Status: DC

## 2012-12-08 MED ORDER — FUROSEMIDE 20 MG PO TABS: 20.0000 mg | ORAL_TABLET | Freq: Every day | ORAL | Status: DC

## 2012-12-08 MED ORDER — POTASSIUM CHLORIDE CRYS ER 20 MEQ PO TBCR: 20.0000 meq | EXTENDED_RELEASE_TABLET | Freq: Every day | ORAL | Status: DC

## 2012-12-08 NOTE — Progress Notes (Signed)
Patient ID: Terry [name], male   DOB: 02/21/1935, 78 y.o.   MRN: [id number] New Patient Visit:  HPI:  Patient presents today as a new patient visit. Patient noted to have admitted rehabilitation nursing home for respiratory failure secondary to pneumonia in the setting of baseline COPD. Was initially on oxygen now titrated off. Please see May 1 and May 19 nursing notes for full details. Now relocated to the area. Is followed by about pulmonology via Dr. Melvyn Novas. Patient denies any acute concerns or complaints. Patient also with a baseline history of coronary artery disease status post stent placement in 1999. Does not have a cardiologist currently.   Patient Active Problem List   Diagnosis Date Noted  . Cough 11/06/2012  . Acute and chronic respiratory failure 10/22/2012  . COPD  10/22/2012  . Chronic kidney disease 10/22/2012  . CAD (coronary artery disease) of artery bypass graft 10/22/2012  . Other and unspecified hyperlipidemia 10/22/2012  . Osteoarthritis 10/22/2012  . HTN (hypertension) 10/22/2012   Past Medical History: Past Medical History  Diagnosis Date  . Stroke 2012  . High cholesterol   . Hypertension   . Kidney disease   . COPD (chronic obstructive pulmonary disease)     Past Surgical History: Past Surgical History  Procedure Laterality Date  . Colon surgery  2012    partial colon removed  . Coronary stent placement    . Appendectomy      Social History: History   Social History  . Marital Status: Married    Spouse Name: N/A    Number of Children: 3  . Years of Education: N/A   Occupational History  . retired    Social History Main Topics  . Smoking status: Former Smoker -- 1.00 packs/day for 49 years    Types: Cigarettes, Pipe, Cigars    Quit date: 02/21/2000  . Smokeless tobacco: Never Used  . Alcohol Use: No  . Drug Use: No  . Sexually Active: None   Other Topics Concern  . None   Social History Narrative  . None    Family  History: Family History  Problem Relation Age of Onset  . Cancer Mother   . Heart disease Father   . Heart attack Father   . Cancer Sister   . Parkinson's disease Brother     Allergies: Allergies  Allergen Reactions  . Other     Yellow Dye-- burns arms    Current Outpatient Prescriptions  Medication Sig Dispense Refill  . albuterol (PROAIR HFA) 108 (90 BASE) MCG/ACT inhaler Inhale 2 puffs into the lungs every 6 (six) hours as needed for wheezing.      [name] atorvastatin (LIPITOR) 10 MG tablet Take 10 mg by mouth daily.      [name] doxazosin (CARDURA) 4 MG tablet Take 1 tablet (4 mg total) by mouth at bedtime.  30 tablet  5  . fluticasone-salmeterol (ADVAIR HFA) 115-21 MCG/ACT inhaler Inhale 2 puffs into the lungs 2 (two) times daily.      . furosemide (LASIX) 20 MG tablet Take 1 tablet (20 mg total) by mouth daily.  30 tablet  5  . metoprolol (LOPRESSOR) 100 MG tablet Take 100 mg by mouth daily.      . potassium chloride SA (K-DUR,KLOR-CON) 20 MEQ tablet Take 1 tablet (20 mEq total) by mouth daily.  30 tablet  5  . tiotropium (SPIRIVA) 18 MCG inhalation capsule Place 18 mcg into inhaler and inhale daily.  No current facility-administered medications for this visit.   Review Of Systems: 12 point ROS negative except as noted above in HPI   Physical Exam: Filed Vitals:   12/08/12 1509  BP: 159/85  Pulse: 84  Temp: 98 F (36.7 C)   General: alert and cooperative HEENT: PERRLA and extra ocular movement intact Heart: S1, S2 normal, no murmur, rub or gallop, regular rate and rhythm Lungs: clear to auscultation and unlabored breathing Abdomen: abdomen is soft without significant tenderness, masses, organomegaly or guarding Extremities: extremities normal, atraumatic, no cyanosis or edema Skin:no rashes, no ecchymoses Neurology: normal without focal findings  Labs and Imaging: Lab Results  Component Value Date/Time   NA 136 11/29/2012 12:05 PM   K 4.2 11/29/2012 12:05 PM   CL  101 11/29/2012 12:05 PM   CO2 29 11/29/2012 12:05 PM   BUN 30* 11/29/2012 12:05 PM   CREATININE 2.1* 11/29/2012 12:05 PM   GLUCOSE 107* 11/29/2012 12:05 PM   Lab Results  Component Value Date   WBC 8.8 11/29/2012   HGB 11.1* 11/29/2012   HCT 32.7* 11/29/2012   MCV 90.6 11/29/2012   PLT 220.0 11/29/2012      Assessment and Plan:  Cardura, Lasix, potassium refilled  today. Has followup with pulmonology next week. Would like cardiology referral from pulmonology. Noted  stage III to 4 CKD based on creatinine. Consider urology referral at next visit. Will otherwise continue to follow

## 2012-12-13 ENCOUNTER — Ambulatory Visit: Payer: Medicare PPO | Admitting: Physical Therapy

## 2012-12-15 ENCOUNTER — Telehealth: Payer: Self-pay | Admitting: Internal Medicine

## 2012-12-15 ENCOUNTER — Ambulatory Visit (INDEPENDENT_AMBULATORY_CARE_PROVIDER_SITE_OTHER): Payer: Medicare PPO | Admitting: Adult Health

## 2012-12-15 ENCOUNTER — Encounter: Payer: Self-pay | Admitting: Adult Health

## 2012-12-15 VITALS — BP 132/78 | HR 77 | Temp 97.2°F | Ht 66.0 in | Wt 180.8 lb

## 2012-12-15 DIAGNOSIS — J4489 Other specified chronic obstructive pulmonary disease: Secondary | ICD-10-CM

## 2012-12-15 DIAGNOSIS — I10 Essential (primary) hypertension: Secondary | ICD-10-CM

## 2012-12-15 DIAGNOSIS — J449 Chronic obstructive pulmonary disease, unspecified: Secondary | ICD-10-CM

## 2012-12-15 DIAGNOSIS — I25810 Atherosclerosis of coronary artery bypass graft(s) without angina pectoris: Secondary | ICD-10-CM

## 2012-12-15 NOTE — Assessment & Plan Note (Signed)
Controlled for on current regimen.

## 2012-12-15 NOTE — Patient Instructions (Addendum)
Continue on current regimen  follow up Dr. Melvyn Novas  In 6 weeks with PFT

## 2012-12-15 NOTE — Telephone Encounter (Signed)
Pt saw TP today so will forward to her to advise on cardiology referral. Please advise thanks

## 2012-12-15 NOTE — Assessment & Plan Note (Signed)
Improved control off ACE   Plan  Cont on current regimen  follow up 6 weeks with PFT

## 2012-12-15 NOTE — Progress Notes (Signed)
  Subjective:    Patient ID: Terry [name], male    DOB: [birth year]  MRN: [id number] HPI 18 yowm quit smoking 2001 bothered by doe seemed better then worse in 2010 p "lung infection" and stayed on advair/spiriva  Combo since 2012 seemed better then admitted in [place] with pna 10/09/12  in then transferred to [facility] x 3 weeks and discharged  On May 1st referred to pulmonary by [facility] for copd eval.  11/29/2012 1st pulmonary ov/ Wert/ on ACEi/ cc doe x maybe a block of walking but not back to baseline and able to do 5 steps and has proaire but not needing. Also persistent dry daytime cough x months >d/c ACE   12/15/2012 Follow up and med review  Returns for 2 weeks follow up  Last visit , ACE was stopped  Dry cough has stopped. Feels he can take in deeper breath in.  CXR last visit w/ chronic changes .  B/P is controlled on current regimen.  Seen PCP last week with no changes.  No fever, chest pain, edema or hemoptysis.  Did not bring meds to visit today for med calendar  "I know my meds. "      Review of Systems  Constitutional: Negative for fever and unexpected weight change.  HENT: Negative for ear pain, nosebleeds, congestion, sore throat, rhinorrhea, sneezing, trouble swallowing, dental problem, postnasal drip and sinus pressure.   Eyes: Negative for redness and itching.  Respiratory: Positive for shortness of breath. Negative for chest tightness and wheezing.   Cardiovascular: neg edema . Negative for palpitations.  Gastrointestinal: Negative for nausea and vomiting.  Genitourinary: Negative for dysuria.  Musculoskeletal: Negative for joint swelling.  Skin: Negative for rash.  Neurological: Negative for headaches.  Hematological: Does not bruise/bleed easily.  Psychiatric/Behavioral: Negative for dysphoric mood. The patient is not nervous/anxious.        Objective:   Physical Exam NAD   HEENT mild turbinate edema.  Oropharynx no thrush or excess pnd or  cobblestoning.  No JVD or cervical adenopathy. Mild accessory muscle hypertrophy. Trachea midline, nl thryroid. Chest was hyperinflated by percussion with diminished breath sounds and moderate increased exp time without wheeze. Hoover sign positive at mid inspiration. Regular rate and rhythm without murmur gallop or rub or increase P2 or edema.  Abd: no hsm, nl excursion. Ext warm without cyanosis or clubbing.    CXR  11/29/2012 :    1. Emphysema.  2. Mild bibasilar atelectasis.  3. Atherosclerosis.  4. Borderline cardiomegaly.      Assessment & Plan:

## 2012-12-16 NOTE — Telephone Encounter (Signed)
This was not discussed at ov w/ TP Called spoke with patient who stated that he had stents placed in 2002 in [place] and would like to establish with a local cardiologist to follow up In between PCPs at the moment Patient requesting okay for referral TP please advise, thanks!  Per TP: ok to do  Order placed Pt is aware

## 2012-12-19 ENCOUNTER — Telehealth: Payer: Self-pay | Admitting: Internal Medicine

## 2012-12-19 NOTE — Telephone Encounter (Signed)
Dup message °

## 2012-12-19 NOTE — Telephone Encounter (Signed)
Patient calling back stating he is almost out of advair.  Really worried about this.  Please call patient with update.

## 2012-12-19 NOTE — Telephone Encounter (Signed)
Called spoke with patient who stated that he has since spoken with [facility] who told him that his advair has shipped Offered to call [facility] for pt to verify this but pt declined stating that the pharmacy will take that as another order Pt stated he will call [facility] again tomorrow to ensure that he was informed correctly and call the office back if this is not the case Pt stated he has plently of advair left (approx 20days' worth) Nothing further needed at this point; Will sign off

## 2012-12-31 DIAGNOSIS — R51 Headache: Secondary | ICD-10-CM

## 2012-12-31 DIAGNOSIS — R030 Elevated blood-pressure reading, without diagnosis of hypertension: Secondary | ICD-10-CM

## 2013-01-02 DIAGNOSIS — I10 Essential (primary) hypertension: Secondary | ICD-10-CM

## 2013-01-02 DIAGNOSIS — I2510 Atherosclerotic heart disease of native coronary artery without angina pectoris: Secondary | ICD-10-CM

## 2013-01-03 ENCOUNTER — Encounter: Payer: Medicare PPO | Admitting: Physical Therapy

## 2013-01-04 ENCOUNTER — Telehealth: Payer: Self-pay | Admitting: Family Medicine

## 2013-01-04 ENCOUNTER — Telehealth: Payer: Self-pay | Admitting: Internal Medicine

## 2013-01-04 NOTE — Telephone Encounter (Signed)
Pt advised and will f/u with PCP.[place] Bing, CMA

## 2013-01-04 NOTE — Telephone Encounter (Signed)
My notes say he needs to stay off lisinopril altogether and there are many other options  But agree he needs tighter bp control for which he needs to see [place] or his primary doctor asap with all active meds in hand

## 2013-01-04 NOTE — Telephone Encounter (Signed)
I spoke with pt. He stated he went to ED past Saturday for high BP [id number] diastolic. He was d/c with lisinopril 5 mg QD. He takes metoprolol 100 mg QD, lasix 20 mg daily. Yesterday his BP was 159/63 then later that day it was 143/50's. He thinks the lisinopril needs to be stronger. Please advise Dr. Melvyn Novas thanks  Allergies  Allergen Reactions  . Other     Yellow Dye-- burns arms

## 2013-01-05 ENCOUNTER — Encounter: Payer: Self-pay | Admitting: Nurse Practitioner

## 2013-01-05 ENCOUNTER — Ambulatory Visit (INDEPENDENT_AMBULATORY_CARE_PROVIDER_SITE_OTHER): Payer: Medicare PPO | Admitting: Nurse Practitioner

## 2013-01-05 ENCOUNTER — Telehealth: Payer: Self-pay | Admitting: Family Medicine

## 2013-01-05 VITALS — BP 185/82 | HR 67 | Temp 97.5°F | Ht 66.0 in | Wt 179.0 lb

## 2013-01-05 DIAGNOSIS — I10 Essential (primary) hypertension: Secondary | ICD-10-CM

## 2013-01-05 MED ORDER — LOSARTAN POTASSIUM 100 MG PO TABS: 100.0000 mg | ORAL_TABLET | Freq: Every day | ORAL | Status: DC

## 2013-01-05 NOTE — Telephone Encounter (Signed)
Dr. Melvyn Novas said he really shouldn't be on Lisinopril and that he should have his PCP should change this medication to something else. He saw Ernestina Patches in June but has a follow up with Jacelyn Grip in Sept. Please advise

## 2013-01-05 NOTE — Patient Instructions (Addendum)

## 2013-01-05 NOTE — Telephone Encounter (Signed)
Pt saw mmm today

## 2013-01-05 NOTE — Telephone Encounter (Signed)
Spoke with pt has appt today with MMM at 2:45

## 2013-01-05 NOTE — Progress Notes (Addendum)
  Subjective:    Patient ID: Terry [name], male    DOB: Aug 19, 1934, 78 y.o.   MRN: [id number]  HPI Pt went to the ED on Saturday because BP was 205/80. Pt states he had a headache and didn't feel good. Pt was admitted and discharged Monday. Pt was started on Lisinopril 5 mg. Patient states, "I saw my pulmonary dr and he said this medication is not good for my lungs. It could make me lose my voice."  So patient stopped taking lisinopril and blood pressure shot way up. *patient was on amlodipine and HCTZ several months ago- but hospital stopped it because he was having bil foot swelling.- Blood pressure really hasn't been right since.   Review of Systems  All other systems reviewed and are negative.       Objective:   Physical Exam  Constitutional: He is oriented to person, place, and time. He appears well-developed and well-nourished.  HENT:  Head: Normocephalic.  Eyes: Pupils are equal, round, and reactive to light.  Cardiovascular: Normal rate, regular rhythm, normal heart sounds and intact distal pulses.   Pulmonary/Chest: Effort normal and breath sounds normal.  Neurological: He is alert and oriented to person, place, and time.  Skin: Skin is warm.  Psychiatric: He has a normal mood and affect. His behavior is normal. Judgment and thought content normal.    BP 185/82  Pulse 67  Temp(Src) 97.5 F (36.4 C) (Oral)  Ht 5\' 6"  (1.676 m)  Wt 179 lb (81.194 kg)  BMI 28.91 kg/m2       Assessment & Plan:  1. Hypertension Patient is going to try amlodipine he has at home first- if feet and ankles start swelling then can stop that and go on losartan as rx. Keep diary of blood pressures Follow up in 3 weeks - losartan (COZAAR) 100 MG tablet; Take 1 tablet (100 mg total) by mouth daily.  Dispense: 30 tablet; Refill: [place], FNP

## 2013-01-06 ENCOUNTER — Telehealth: Payer: Self-pay | Admitting: *Deleted

## 2013-01-06 NOTE — Telephone Encounter (Signed)
I dont see where he took clonidine

## 2013-01-06 NOTE — Telephone Encounter (Signed)
bp shot up last night to 197/75 he took amlodipine when he got home. Please advise

## 2013-01-06 NOTE — Telephone Encounter (Signed)
Spoke with patient- he took an extra clonidine last night and felt better this AM- [id number] systolic- took metoprolol this AM, and amlodipine- haven't check blood pressure since this AM. Start on losartan as rx daily- hold the clonidne for when blood pressure shoots up > than 123XX123 systolic Continue to keep check on swelling.

## 2013-01-06 NOTE — Telephone Encounter (Signed)
Spoke with patien last night and told him to take another clonidine- did it come down after taking clonidine?

## 2013-01-09 ENCOUNTER — Telehealth: Payer: Self-pay | Admitting: Internal Medicine

## 2013-01-09 NOTE — Telephone Encounter (Signed)
Spoke with pt's wife who states that his blood pressure is not under control and that they are not satisfied with primary care provider. Would like to know who would MW recommend. Please advise.

## 2013-01-09 NOTE — Telephone Encounter (Signed)
Spoke with patient he was worried that losartan and klor con were the same. i spoke with patient and he is aware they are two different medication.

## 2013-01-09 NOTE — Telephone Encounter (Signed)
We would be happy to help him with this in the short run to get him straighten this out but when we tried with tammy NP he did not bring all meds with him.  If he wants to try again with Tammy this week that would be the best way to get a handle on how to adjust his meds optimally - if not willing to do this step there's nothing else we can do about his bp through this office

## 2013-01-09 NOTE — Telephone Encounter (Signed)
Called spoke with spouse, advised of MW's recs below.  TP with no med cal openings until 7.28.14 >> appt scheduled for 4pm that day. Advised spouse that pt MUST bring ALL medication bottles with him (rx, otc, inhalers, etc).  Spouse verbalized her understanding and stated that pt did begin the Losartan 100mg  QD given at 7.17.14 by [facility] and that she believes this is slowly bringing his BP down.  She is aware that this may take a few more days for his BP to reach optimal level.  Spouse/pt to call sooner if needed for sooner follow up.  Nothing further needed at this time; will sign off.

## 2013-01-10 ENCOUNTER — Telehealth: Payer: Self-pay | Admitting: Emergency Medicine

## 2013-01-10 ENCOUNTER — Telehealth: Payer: Self-pay | Admitting: Internal Medicine

## 2013-01-10 ENCOUNTER — Telehealth: Payer: Self-pay | Admitting: Nurse Practitioner

## 2013-01-10 NOTE — Telephone Encounter (Signed)
Spoke with pt  I advised needs to call his PCP for this med  He was just seen for HTN 01/05/13 by Chevis Pretty and she should be able to refill his meds  Pt verbalized understanding and states nothing further needed

## 2013-01-10 NOTE — Telephone Encounter (Signed)
NOTE: spouse adds that pt was prescribed this while in the hospital (it is not currently on his med list). Phone # to call is pt himself per spouse. Mariann Laster

## 2013-01-10 NOTE — Telephone Encounter (Signed)
Clonidine rx today or tomorrow.  She is concerned about pt not having his med and ending back up in the hospital.  She is wanting to know if MW will refill rx until they see Dr Charlett Blake.  They use [facility] in [place]. Dion Saucier

## 2013-01-10 NOTE — Telephone Encounter (Signed)
Yes ok to continue clonidine - the problem is that we don't even have him as taking this med and this reflects my previous concern about maintaining med reconciliation.  Ok to take extra clonidine daily for sbp > 160 but in meantime just refill a month supply of whatever he's taking

## 2013-01-10 NOTE — Telephone Encounter (Signed)
Spoke with Terry [name]-- Terry [name] states patients BP is slowly going back up,  Last night BP was 99991111 systolic Patients PCP PA Ronnald Collum is out of town and per Terry [name] because they did not originally prescribe clonidine (originally prescribed in ED) they have to wait 72 hrs to fill Patient is scheduled to see Rexene Edison Monday July 28 for med calender Terry [name] states patient only has 3 clonidine tablets left and is requesting a rx to last until appt with Tammy Does not want patient to end up back in the hospital (previously admitted with BP 123XX123 systolic) Dr. Melvyn Novas is this ok to fill nuntil patient is seen Monday?

## 2013-01-11 ENCOUNTER — Telehealth: Payer: Self-pay | Admitting: Nurse Practitioner

## 2013-01-11 MED ORDER — CLONIDINE HCL 0.1 MG PO TABS: ORAL_TABLET | ORAL | Status: DC

## 2013-01-11 NOTE — Telephone Encounter (Signed)
Spoke with pt's spouse and notified of recs per MW She verbalized understanding  Rx was refilled x 1 only

## 2013-01-11 NOTE — Telephone Encounter (Signed)
[id number] to advise and to get the strength and direction for clonidine. [place] Bing, CMA

## 2013-01-11 NOTE — Telephone Encounter (Signed)
Wife is calling back please call [phone]

## 2013-01-11 NOTE — Telephone Encounter (Signed)
dont see clonidine on list

## 2013-01-12 ENCOUNTER — Other Ambulatory Visit: Payer: Self-pay | Admitting: Nurse Practitioner

## 2013-01-12 MED ORDER — CLONIDINE HCL 0.1 MG PO TABS: ORAL_TABLET | ORAL | Status: DC

## 2013-01-12 NOTE — Telephone Encounter (Signed)
Aware. 

## 2013-01-12 NOTE — Telephone Encounter (Signed)
rx sent to pharmacy

## 2013-01-16 ENCOUNTER — Ambulatory Visit (INDEPENDENT_AMBULATORY_CARE_PROVIDER_SITE_OTHER): Payer: Medicare PPO | Admitting: Adult Health

## 2013-01-16 ENCOUNTER — Encounter: Payer: Self-pay | Admitting: Adult Health

## 2013-01-16 VITALS — BP 140/80 | HR 60 | Temp 97.4°F | Ht 67.0 in | Wt 181.4 lb

## 2013-01-16 DIAGNOSIS — I10 Essential (primary) hypertension: Secondary | ICD-10-CM

## 2013-01-16 DIAGNOSIS — J449 Chronic obstructive pulmonary disease, unspecified: Secondary | ICD-10-CM

## 2013-01-16 NOTE — Patient Instructions (Addendum)
Follow med calendar closely and bring to each visit.  Follow up with Dr. Melvyn Novas  As planned with PFT

## 2013-01-17 ENCOUNTER — Telehealth: Payer: Self-pay

## 2013-01-17 MED ORDER — METOPROLOL TARTRATE 100 MG PO TABS: 100.0000 mg | ORAL_TABLET | Freq: Every day | ORAL | Status: DC

## 2013-01-17 MED ORDER — ATORVASTATIN CALCIUM 10 MG PO TABS: 10.0000 mg | ORAL_TABLET | Freq: Every day | ORAL | Status: DC

## 2013-01-17 NOTE — Telephone Encounter (Signed)
Need Rx for mail order Metoprolol 100 qd  For [facility]  Atorvastatin 10 mg

## 2013-01-17 NOTE — Assessment & Plan Note (Signed)
Cough resolved off ACE inhibitor  Patient's medications were reviewed today and patient education was given. Computerized medication calendar was adjusted/completed  Advised on taking b/p meds consistently Low salt diet  follow up PCP for b/p issues

## 2013-01-17 NOTE — Progress Notes (Signed)
  Subjective:    Patient ID: Terry [name], male    DOB: August 15, 1934  MRN: [id number] HPI 62 yowm quit smoking 2001 bothered by doe seemed better then worse in 2010 p "lung infection" and stayed on advair/spiriva  Combo since 2012 seemed better then admitted in [place] with pna 10/09/12  in then transferred to [facility] NH x 3 weeks and discharged  On May 1st referred to pulmonary by [facility] for copd eval.  11/29/2012 1st pulmonary ov/ Wert/ on ACEi/ cc doe x maybe a block of walking but not back to baseline and able to do 5 steps and has proaire but not needing. Also persistent dry daytime cough x months >d/c ACE   12/15/2012 Follow up and med review  Returns for 2 weeks follow up  Last visit , ACE was stopped  Dry cough has stopped. Feels he can take in deeper breath in.  CXR last visit w/ chronic changes .  B/P is controlled on current regimen.  Seen PCP last week with no changes.  No fever, chest pain, edema or hemoptysis.  Did not bring meds to visit today for med calendar  "I know my meds. "  >no changes   7/281/4 Follow up and Med review  Pt returns for 1 month follow up and med review  We reviewed all his meds organized them into a med calendar with pt education .  Pt is unclear on his medications and takes b/p meds "if my b/p is high"  Explained that is not a good idea. He replied that is what his doctor told him to do.  Says his cough and dyspnea are much improved. Cough is resolved and he feels much better.  pt reports breathing is doing well.  Has upcoming PFT soon.  No chest pain or orthopnea   Review of Systems  Constitutional: Negative for fever and unexpected weight change.  HENT: Negative for ear pain, nosebleeds, congestion, sore throat, rhinorrhea, sneezing, trouble swallowing, dental problem, postnasal drip and sinus pressure.   Eyes: Negative for redness and itching.  Respiratory:  Negative for chest tightness and wheezing.   Cardiovascular: neg edema . Negative  for palpitations.  Gastrointestinal: Negative for nausea and vomiting.  Genitourinary: Negative for dysuria.  Musculoskeletal: Negative for joint swelling.  Skin: Negative for rash.  Neurological: Negative for headaches.  Hematological: Does not bruise/bleed easily.  Psychiatric/Behavioral: Negative for dysphoric mood. The patient is not nervous/anxious.        Objective:   Physical Exam NAD   HEENT mild turbinate edema.  Oropharynx no thrush or excess pnd or cobblestoning.  No JVD or cervical adenopathy. Mild accessory muscle hypertrophy. Trachea midline, nl thryroid. Chest  CTA bilaterally . Regular rate and rhythm without murmur gallop or rub or increase P2 or edema.  Abd: no hsm, nl excursion. Ext warm without cyanosis or clubbing.    CXR  11/29/2012 :    1. Emphysema.  2. Mild bibasilar atelectasis.  3. Atherosclerosis.  4. Borderline cardiomegaly.      Assessment & Plan:

## 2013-01-17 NOTE — Assessment & Plan Note (Addendum)
Compensated on present regimen  Return for PFT

## 2013-01-17 NOTE — Telephone Encounter (Signed)
rx ready for pick up- NTBS for labs

## 2013-01-18 NOTE — Telephone Encounter (Signed)
Pt aware to pick up rx's

## 2013-01-19 NOTE — Addendum Note (Signed)
Addended by: Parke Poisson E on: 01/19/2013 02:14 PM   Modules accepted: Orders

## 2013-01-26 ENCOUNTER — Encounter: Payer: Self-pay | Admitting: Cardiovascular Disease

## 2013-01-26 ENCOUNTER — Ambulatory Visit (INDEPENDENT_AMBULATORY_CARE_PROVIDER_SITE_OTHER): Payer: Medicare PPO | Admitting: Internal Medicine

## 2013-01-26 ENCOUNTER — Ambulatory Visit (INDEPENDENT_AMBULATORY_CARE_PROVIDER_SITE_OTHER): Payer: Medicare PPO | Admitting: Cardiovascular Disease

## 2013-01-26 ENCOUNTER — Encounter: Payer: Self-pay | Admitting: Internal Medicine

## 2013-01-26 VITALS — BP 142/60 | HR 74 | Temp 97.1°F | Ht 67.0 in | Wt 182.0 lb

## 2013-01-26 VITALS — BP 132/62 | HR 64 | Ht 67.0 in | Wt 182.0 lb

## 2013-01-26 DIAGNOSIS — J449 Chronic obstructive pulmonary disease, unspecified: Secondary | ICD-10-CM

## 2013-01-26 DIAGNOSIS — I2510 Atherosclerotic heart disease of native coronary artery without angina pectoris: Secondary | ICD-10-CM

## 2013-01-26 DIAGNOSIS — I10 Essential (primary) hypertension: Secondary | ICD-10-CM

## 2013-01-26 LAB — PULMONARY FUNCTION TEST

## 2013-01-26 MED ORDER — BUDESONIDE-FORMOTEROL FUMARATE 160-4.5 MCG/ACT IN AERO: INHALATION_SPRAY | RESPIRATORY_TRACT | Status: DC

## 2013-01-26 NOTE — Patient Instructions (Addendum)
Your physician wants you to follow-up in:  6 months.  You will receive a reminder letter in the mail two months in advance. If you don't receive a letter, please call our office to schedule the follow-up appointment.  Your physician has requested that you have an echocardiogram. Echocardiography is a painless test that uses sound waves to create images of your heart. It provides your doctor with information about the size and shape of your heart and how well your heart's chambers and valves are working. This procedure takes approximately one hour. There are no restrictions for this procedure.   Your physician recommends that you return for fasting lab work on day of echo--Lipid and Liver profiles.

## 2013-01-26 NOTE — Progress Notes (Signed)
Subjective:    Patient ID: Terry [name], male    DOB: 03/25/1935  MRN: [id number]   Brief patient profile:  73 yowm quit smoking 2001 bothered by doe seemed better then worse in 2010 p "lung infection" and stayed on advair/spiriva  Combo since 2012 seemed better then admitted in [facility] with pna 10/09/12  in then transferred to [facility] NH x 3 weeks and discharged  On May 1st referred to pulmonary by [facility] for copd eval and proved to have GOLD III copd 01/26/13 with symptoms much better off ACEi   HPI 11/29/2012 1st pulmonary ov/ Terry [name]/ on ACEi/ cc doe x maybe a block of walking but not back to baseline and able to do 5 steps and has proaire but not needing. Also persistent dry daytime cough x months >d/c ACE   12/15/2012 Follow up and med review  Returns for 2 weeks follow up  Last visit , ACE was stopped  Dry cough has stopped. Feels he can take in deeper breath in.  CXR last visit w/ chronic changes .  B/P is controlled on current regimen.  Seen PCP last week with no changes.  No fever, chest pain, edema or hemoptysis.  Did not bring meds to visit today for med calendar  "I know my meds. "  >no changes   7/281/4 Follow up and Med review  Pt returns for 1 month follow up and med review  We reviewed all his meds organized them into a med calendar with pt education .  Pt is unclear on his medications and takes b/p meds "if my b/p is high"  Explained that is not a good idea. He replied that is what his doctor told him to do.  Says his cough and dyspnea are much improved. Cough is resolved and he feels much better.  pt reports breathing is doing well.  rec No change rx       01/26/2013 f/u ov/Terry [name]  Chief Complaint  Patient presents with  . Followup with PFT    Pt states that his breathing is unchanged since his last visit. No new co's today.    no longer limited by breathing   No obvious daytime variabilty or assoc chronic cough or cp or chest tightness, subjective wheeze  overt sinus or hb symptoms. No unusual exp hx or h/o childhood pna/ asthma or knowledge of premature birth.   Sleeping ok without nocturnal  or early am exacerbation  of respiratory  c/o's or need for noct saba. Also denies any obvious fluctuation of symptoms with weather or environmental changes or other aggravating or alleviating factors except as outlined above   Current Medications, Allergies, Past Medical History, Past Surgical History, Family History, and Social History were reviewed in [employer] record.  ROS  The following are not active complaints unless bolded sore throat, dysphagia, dental problems, itching, sneezing,  nasal congestion or excess/ purulent secretions, ear ache,   fever, chills, sweats, unintended wt loss, pleuritic or exertional cp, hemoptysis,  orthopnea pnd or leg swelling, presyncope, palpitations, heartburn, abdominal pain, anorexia, nausea, vomiting, diarrhea  or change in bowel or urinary habits, change in stools or urine, dysuria,hematuria,  rash, arthralgias, visual complaints, headache, numbness weakness or ataxia or problems with walking or coordination,  change in mood/affect or memory.            Objective:   Physical Exam  Hoarse amb wm   Wt Readings from Last 3 Encounters:  01/26/13 182 lb (  82.555 kg)  01/26/13 182 lb (82.555 kg)  01/16/13 181 lb 6.4 oz (82.283 kg)     HEENT mild turbinate edema.  Oropharynx no thrush or excess pnd or cobblestoning.  No JVD or cervical adenopathy. Mild accessory muscle hypertrophy. Trachea midline, nl thryroid. Chest  CTA bilaterally . Regular rate and rhythm without murmur gallop or rub or increase P2 or edema.  Abd: no hsm, nl excursion. Ext warm without cyanosis or clubbing.    CXR  11/29/2012 :    1. Emphysema.  2. Mild bibasilar atelectasis.  3. Atherosclerosis.  4. Borderline cardiomegaly.      Assessment & Plan:

## 2013-01-26 NOTE — Progress Notes (Addendum)
History of Present Illness: 78 yo male with history of HTN, HLD, CVA, COPD, CAD here today to establish cardiac care. He had 2 stents placecd in [facility] in [place], [name] in 1999. Followed by cardiology in [place], [name] for many years. Doing well over the years. Moved here in May 2014. No chest pain or SOB. Very active.   Addendum 02/10/13: Cath records received from [facility]. Cardiac cath 11/30/97. Mild distal Left main stenosis, 25% proximal LAD, minor irregularities Circumflex, 95% mid RCA stenosis treated with 3.5 x 32 mm bare metal stent. There was a ? Overlapping 3.5 x 18 mm bare metal stent more proximally.   Primary Care Physician: Chevis Pretty   Past Medical History  Diagnosis Date  . Stroke 2012  . High cholesterol   . Hypertension   . Kidney disease   . COPD (chronic obstructive pulmonary disease)   . CAD (coronary artery disease) [place], [name]    Past Surgical History  Procedure Laterality Date  . Colon surgery  2012    partial colon removed  . Coronary stent placement    . Appendectomy  1984    Current Outpatient Prescriptions  Medication Sig Dispense Refill  . amLODipine (NORVASC) 10 MG tablet Take 10 mg by mouth every morning.      [name] atorvastatin (LIPITOR) 10 MG tablet Take 1 tablet (10 mg total) by mouth daily.  90 tablet  1  . budesonide-formoterol (SYMBICORT) 160-4.5 MCG/ACT inhaler Take 2 puffs first thing in am and then another 2 puffs about 12 hours later.  1 Inhaler  12  . butalbital-acetaminophen-caffeine (FIORICET) 50-325-40 MG per tablet Take 1 tablet by mouth 2 (two) times daily as needed for headache.      . cloNIDine (CATAPRES) 0.1 MG tablet Take 0.1 mg by mouth 2 (two) times daily as needed.      . doxazosin (CARDURA) 4 MG tablet Take 1 tablet (4 mg total) by mouth at bedtime.  30 tablet  5  . fluticasone-salmeterol (ADVAIR HFA) 115-21 MCG/ACT inhaler Inhale 2 puffs into the lungs 2 (two) times daily.      . furosemide (LASIX) 20  MG tablet Take 1 tablet (20 mg total) by mouth daily.  30 tablet  5  . losartan (COZAAR) 100 MG tablet Take 1 tablet (100 mg total) by mouth daily.  30 tablet  3  . metoprolol (LOPRESSOR) 100 MG tablet Take 1 tablet (100 mg total) by mouth daily.  90 tablet  1  . potassium chloride SA (K-DUR,KLOR-CON) 20 MEQ tablet Take 1 tablet (20 mEq total) by mouth daily.  30 tablet  5  . tiotropium (SPIRIVA) 18 MCG inhalation capsule Place 18 mcg into inhaler and inhale daily.       No current facility-administered medications for this visit.    Allergies  Allergen Reactions  . Other     Yellow Dye-- burns arms    History   Social History  . Marital Status: Married    Spouse Name: N/A    Number of Children: 3  . Years of Education: N/A   Occupational History  . retired-worked in radio    Social History Main Topics  . Smoking status: Former Smoker -- 1.00 packs/day for 49 years    Types: Cigarettes, Pipe, Cigars    Quit date: 02/21/2000  . Smokeless tobacco: Never Used  . Alcohol Use: No  . Drug Use: No  . Sexually Active: Not on file  Other Topics Concern  . Not on file   Social History Narrative  . No narrative on file    Family History  Problem Relation Age of Onset  . Cancer Mother   . Heart disease Father   . Heart attack Father   . Cancer Sister   . Parkinson's disease Brother     Review of Systems:  As stated in the HPI and otherwise negative.   BP 132/62  Pulse 64  Ht 5\' 7"  (1.702 m)  Wt 182 lb (82.555 kg)  BMI 28.5 kg/m2  Physical Examination: General: Well developed, well nourished, NAD HEENT: OP clear, mucus membranes moist SKIN: warm, dry. No rashes. Neuro: No focal deficits Musculoskeletal: Muscle strength 5/5 all ext Psychiatric: Mood and affect normal Neck: No JVD, no carotid bruits, no thyromegaly, no lymphadenopathy. Lungs:Clear bilaterally, no wheezes, rhonci, crackles Cardiovascular: Regular rate and rhythm. No murmurs, gallops or  rubs. Abdomen:Soft. Bowel sounds present. Non-tender.  Extremities: No lower extremity edema. Pulses are 2 + in the bilateral DP/PT.  EKG: Sinus with 1st degree AV block.   Assessment and Plan:   1. CAD: Stable. BP controlled. Will check lipids and LFTs. Will get echo to get baseline. No change in medications today.

## 2013-01-26 NOTE — Progress Notes (Signed)
PFT done. Jennifer Castillo, CMA  

## 2013-01-26 NOTE — Patient Instructions (Addendum)
When advair runs out try symbicort 160 Take 2 puffs first thing in am and then another 2 puffs about 12 hours later and see if you don't like it better  Change your bedtime medications all to evening dosing   Please schedule a follow up visit in 3 months but call sooner if needed

## 2013-01-27 ENCOUNTER — Encounter: Payer: Self-pay | Admitting: Internal Medicine

## 2013-01-27 NOTE — Assessment & Plan Note (Addendum)
-   Trial off ACEi 11/30/2012 >>> much better symptom control - PFT's 01/26/13  FEV1  1.28 (48%) ratio 48 and no better p B2,  DLCO 39 corrects to 48% - hfa 75% p coaching 01/26/13   Main issue now is hoarseness ? Related to advair > try symbicort when advair runs out  The proper method of use, as well as anticipated side effects, of a metered-dose inhaler are discussed and demonstrated to the patient. Improved effectiveness after extensive coaching during this visit to a level of approximately  75%      Each maintenance medication was reviewed in detail including most importantly the difference between maintenance and as needed and under what circumstances the prns are to be used. This was done in the context of a medication calendar review which provided the patient with a user-friendly unambiguous mechanism for medication administration and reconciliation and provides an action plan for all active problems. It is critical that this be shown to every doctor  for modification during the office visit if necessary so the patient can use it as a working document.

## 2013-01-27 NOTE — Assessment & Plan Note (Signed)
Adequate control on present rx, reviewed > no change in rx needed   

## 2013-01-30 ENCOUNTER — Telehealth: Payer: Self-pay | Admitting: Cardiovascular Disease

## 2013-01-30 NOTE — Telephone Encounter (Signed)
Records Rec From [facility], Gave to [facility] 01/31/13/KM

## 2013-01-30 NOTE — Telephone Encounter (Signed)
ROI faxed to [facility] at [phone] 01/30/13/KM

## 2013-02-01 ENCOUNTER — Telehealth: Payer: Self-pay | Admitting: Family Medicine

## 2013-02-01 ENCOUNTER — Telehealth: Payer: Self-pay | Admitting: Nurse Practitioner

## 2013-02-01 NOTE — Telephone Encounter (Signed)
appt made

## 2013-02-01 NOTE — Telephone Encounter (Signed)
Pt was originally sch for 02-07-13. Pt was rsc to 02-27-13. Pt wife would like a sooner appt. Pt has medicare

## 2013-02-01 NOTE — Telephone Encounter (Signed)
Would advise keep 02/27/13 appt unless we have any other medicare appt opening prior to that. Would advise he see previous PCP for any acute issues as the new patient visit is to review health history, meds, etc.

## 2013-02-02 ENCOUNTER — Encounter: Payer: Self-pay | Admitting: Nurse Practitioner

## 2013-02-02 ENCOUNTER — Ambulatory Visit (INDEPENDENT_AMBULATORY_CARE_PROVIDER_SITE_OTHER): Payer: Medicare PPO | Admitting: Nurse Practitioner

## 2013-02-02 VITALS — BP 165/72 | HR 62 | Temp 97.7°F | Ht 67.0 in | Wt 182.0 lb

## 2013-02-02 DIAGNOSIS — I10 Essential (primary) hypertension: Secondary | ICD-10-CM

## 2013-02-02 DIAGNOSIS — L408 Other psoriasis: Secondary | ICD-10-CM

## 2013-02-02 DIAGNOSIS — L409 Psoriasis, unspecified: Secondary | ICD-10-CM

## 2013-02-02 NOTE — Telephone Encounter (Signed)
S/W pt's wife and she has agreed to keep the appt for 02/27/13.

## 2013-02-02 NOTE — Progress Notes (Signed)
  Subjective:    Patient ID: Terry [name], male    DOB: January 08, 1935, 78 y.o.   MRN: [id number]  HPI 1. Patient in c/o dry itchy scale- went to hair dresser and they told him he had psoriasis and needed to be seen- Has been there off and on for many years but has gotten worse the last several weeks.  2. Patient concerned about blood pressure- Checks it at home and runs [id number] systolic- never high at home but high at doctors office.  Review of Systems  HENT: Negative.   Respiratory: Negative.   Cardiovascular: Negative for chest pain, palpitations and leg swelling.  Genitourinary: Negative.   Skin: Positive for rash (scalp).  Hematological: Negative.   Psychiatric/Behavioral: Negative.        Objective:   Physical Exam  Constitutional: He appears well-developed and well-nourished.  Cardiovascular: Normal rate and normal heart sounds.   Pulmonary/Chest: Effort normal and breath sounds normal.  Skin: Skin is warm.  Small erythematous patchy about 5cm in size with a silvery plaque bil post scalp line.   BP 165/72  Pulse 62  Temp(Src) 97.7 F (36.5 C) (Oral)  Ht 5\' 7"  (1.702 m)  Wt 182 lb (82.555 kg)  BMI 28.5 kg/m2        Assessment & Plan:  1. Hypertension Switch and take losartan in evening and see if makes B/p more consistent Low NA+ diet Avoid decongestants  2. Scalp psoriasis T gel shampoo- or things with salysilates in it RTO or derm referral if no better  Mary-Margaret Hassell Done, FNP

## 2013-02-02 NOTE — Patient Instructions (Signed)
Salicylic Acid topical gel, cream, lotion, solution What is this medicine? SALICYCLIC ACID (SAL i SIL ik AS id) breaks down layers of thick skin. It is used to treat common and plantar warts, psoriasis, calluses, and corns. It is also used to treat or to prevent acne. This medicine may be used for other purposes; ask your health care provider or pharmacist if you have questions. What should I tell my health care provider before I take this medicine? They need to know if you have any of these conditions: -child with chickenpox, the flu, or other viral infection -kidney disease -liver disease -an unusual or allergic reaction to salicylic acid, other medicines, foods, dyes, or preservatives -pregnant or trying to get pregnant -breast-feeding How should I use this medicine? This medicine is for external use only. Follow the directions on the label. Do not apply to raw or irritated skin. Avoid getting medicine in your eyes, lips, nose, mouth, or other sensitive areas. Use this medicine at regular intervals. Do not use more often than directed. Talk to your pediatrician regarding the use of this medicine in children. Special care may be needed. This medicine is not approved for use in children under 11 years old. Overdosage: If you think you have taken too much of this medicine contact a poison control center or emergency room at once. NOTE: This medicine is only for you. Do not share this medicine with others. What if I miss a dose? If you miss a dose, use it as soon as you can. If it is almost time for your next dose, use only that dose. Do not use double or extra doses. What may interact with this medicine? -medicines that change urine pH like ammonium chloride, sodium bicarbonate, and others -medicines that treat or prevent blood clots like warfarin -methotrexate -pyrazinamide -some medicines for diabetes -some medicines for gout -steroid medicines like prednisone or cortisone This list may  not describe all possible interactions. Give your health care provider a list of all the medicines, herbs, non-prescription drugs, or dietary supplements you use. Also tell them if you smoke, drink alcohol, or use illegal drugs. Some items may interact with your medicine. What should I watch for while using this medicine? Tell your doctor is your symptoms do not get better or if they get worse. This medicine can make you more sensitive to the sun. Keep out of the sun. If you cannot avoid being in the sun, wear protective clothing and use sunscreen. Do not use sun lamps or tanning beds/booths. Use of this medicine in children under 12 years or in patients with kidney or liver disease may increase the risk of serious side effects. These patients should not use this medicine over large areas of skin. If you notice symptoms such as nausea, vomiting, dizziness, loss of hearing, ringing in the ears, unusual weakness or tiredness, fast or labored breathing, diarrhea, or confusion, stop using this medicine and contact your doctor or health care professional. What side effects may I notice from receiving this medicine? Side effects that you should report to your doctor or health care professional as soon as possible: -allergic reactions like skin rash, itching or hives, swelling of the face, lips, or tongue Side effects that usually do not require medical attention (report to your doctor or health care professional if they continue or are bothersome): -skin irritation This list may not describe all possible side effects. Call your doctor for medical advice about side effects. You may report side effects to  FDA at 1-800-FDA-1088. Where should I keep my medicine? Keep out of the reach of children. Store at room temperature between 15 and 30 degrees C (59 and 86 degrees F). Do not freeze. Throw away any unused medicine after the expiration date. NOTE: This sheet is a summary. It may not cover all possible  information. If you have questions about this medicine, talk to your doctor, pharmacist, or health care provider.  2013, Elsevier/Gold Standard. (02/10/2008 1:36:20 PM)

## 2013-02-03 ENCOUNTER — Other Ambulatory Visit (INDEPENDENT_AMBULATORY_CARE_PROVIDER_SITE_OTHER): Payer: Medicare PPO

## 2013-02-03 ENCOUNTER — Encounter: Payer: Self-pay | Admitting: Internal Medicine

## 2013-02-03 ENCOUNTER — Ambulatory Visit (HOSPITAL_COMMUNITY): Payer: Medicare PPO | Attending: Cardiovascular Disease | Admitting: Radiology

## 2013-02-03 DIAGNOSIS — I10 Essential (primary) hypertension: Secondary | ICD-10-CM | POA: Insufficient documentation

## 2013-02-03 DIAGNOSIS — J4489 Other specified chronic obstructive pulmonary disease: Secondary | ICD-10-CM | POA: Insufficient documentation

## 2013-02-03 DIAGNOSIS — J449 Chronic obstructive pulmonary disease, unspecified: Secondary | ICD-10-CM | POA: Insufficient documentation

## 2013-02-03 DIAGNOSIS — E785 Hyperlipidemia, unspecified: Secondary | ICD-10-CM | POA: Insufficient documentation

## 2013-02-03 DIAGNOSIS — I2510 Atherosclerotic heart disease of native coronary artery without angina pectoris: Secondary | ICD-10-CM

## 2013-02-03 DIAGNOSIS — Z87891 Personal history of nicotine dependence: Secondary | ICD-10-CM | POA: Insufficient documentation

## 2013-02-03 LAB — LIPID PANEL
HDL: 32.7 mg/dL — ABNORMAL LOW (ref >39.00)
Total CHOL/HDL Ratio: 5
Triglycerides: 274 mg/dL — ABNORMAL HIGH (ref 0.0–149.0)
VLDL: 54.8 mg/dL — ABNORMAL HIGH (ref 0.0–40.0)

## 2013-02-03 LAB — HEPATIC FUNCTION PANEL: Total Bilirubin: 0.5 mg/dL (ref 0.3–1.2)

## 2013-02-03 NOTE — Progress Notes (Signed)
Echocardiogram performed.  

## 2013-02-07 ENCOUNTER — Ambulatory Visit: Payer: Medicare PPO | Admitting: Family Medicine

## 2013-02-08 ENCOUNTER — Ambulatory Visit (INDEPENDENT_AMBULATORY_CARE_PROVIDER_SITE_OTHER): Payer: Medicare PPO | Admitting: Family Medicine

## 2013-02-08 ENCOUNTER — Encounter: Payer: Self-pay | Admitting: Family Medicine

## 2013-02-08 ENCOUNTER — Ambulatory Visit: Payer: Medicare PPO | Admitting: Family Medicine

## 2013-02-08 VITALS — BP 130/80 | Temp 98.3°F | Ht 66.5 in | Wt 182.0 lb

## 2013-02-08 DIAGNOSIS — N189 Chronic kidney disease, unspecified: Secondary | ICD-10-CM

## 2013-02-08 DIAGNOSIS — I10 Essential (primary) hypertension: Secondary | ICD-10-CM

## 2013-02-08 DIAGNOSIS — Z7689 Persons encountering health services in other specified circumstances: Secondary | ICD-10-CM

## 2013-02-08 DIAGNOSIS — I25810 Atherosclerosis of coronary artery bypass graft(s) without angina pectoris: Secondary | ICD-10-CM

## 2013-02-08 DIAGNOSIS — J449 Chronic obstructive pulmonary disease, unspecified: Secondary | ICD-10-CM

## 2013-02-08 NOTE — Patient Instructions (Addendum)
-  PLEASE SIGN UP FOR MYCHART TODAY   We recommend the following healthy lifestyle measures: - eat a healthy diet consisting of lots of vegetables, fruits, beans, nuts, seeds, healthy meats such as white chicken and fish and whole grains.  - avoid fried foods, fast food, processed foods, sodas, red meet and other fattening foods.  - get a least 150 minutes of aerobic exercise per week.   -if persistent elevated night time BP after sitting for 5 minutes higher the 140/90 on average please discuss with your cardiologist - could possibly take metoprolol twice daily or change lasix to HCTZ depending on cardiologist recommendations.  Follow up in: 3-4 months

## 2013-02-08 NOTE — Progress Notes (Signed)
Chief Complaint  Patient presents with  . Establish Care  . bp concerns    HPI:  Terry [name] is here to establish care. Recently moved back to [place] in May. Used to see Ronnald Collum and Dr. Ernestina Patches at [place]. Complicated PMH and followed by [facility] for his HTN, HLD, CVA, CAD and recently established with them and had labs and echo this month which were per cards notes ok. Has rather severe COPD followed by Pulmonology whom he saw this month. His lisinopril was recently stopped by pulm. Has CKD. Seen by prior PCP a few days ago for dry scalp and advised to use tgel shampoo and take losartan in PM as BP was elevated at visit. Last PCP and physical:  Has the following chronic problems and concerns today:  1)Dry itchy scalp: -started tgel shampoo a few days ago  2)CAD, HTN, HLD: -followed by cards -meds: ASA, norvasc 10mg , cardura 4mg , lasix 20mg , cozaar 100mg , metoprolol 100mg , Kdur 20mg , lipitor 10 mg -followed by [place] cards whom he saw earlier this  -BP sometimes increases at night  3)COPD: -meds switching from advair to symbicort, spiriva -followed by pum whom he saw this month  4)CKD: -stage III per notes   Patient Active Problem List   Diagnosis Date Noted  . COPD GOLD III 10/22/2012  . Chronic kidney disease 10/22/2012  . CAD (coronary artery disease) of artery bypass graft 10/22/2012  . Other and unspecified hyperlipidemia 10/22/2012  . Osteoarthritis 10/22/2012  . HTN (hypertension) 10/22/2012    ROS: See pertinent positives and negatives per HPI.  Past Medical History  Diagnosis Date  . Stroke 2012  . High cholesterol   . Hypertension   . Kidney disease   . COPD (chronic obstructive pulmonary disease)   . CAD (coronary artery disease) [place], [name]  . Acute and chronic respiratory failure 10/22/2012  . Osteoarthritis 10/22/2012  . Migraine     Family History  Problem Relation Age of Onset  . Cancer Mother   .  Heart disease Father   . Heart attack Father   . Cancer Sister   . Parkinson's disease Brother     History   Social History  . Marital Status: Married    Spouse Name: N/A    Number of Children: 3  . Years of Education: N/A   Occupational History  . retired-worked in radio    Social History Main Topics  . Smoking status: Former Smoker -- 1.00 packs/day for 49 years    Types: Cigarettes, Pipe, Cigars    Quit date: 02/21/2000  . Smokeless tobacco: Never Used  . Alcohol Use: No  . Drug Use: No  . Sexual Activity: None   Other Topics Concern  . None   Social History Narrative   Work or School: retired [employer] Situation: lives with wife in [place] regular; poor diet             Current outpatient prescriptions:amLODipine (NORVASC) 10 MG tablet, Take 10 mg by mouth every morning., Disp: , Rfl: ;  aspirin 325 MG EC tablet, Take 162 mg by mouth daily., Disp: , Rfl: ;  atorvastatin (LIPITOR) 10 MG tablet, Take 1 tablet (10 mg total) by mouth daily., Disp: 90 tablet, Rfl: 1;  doxazosin (CARDURA) 4 MG tablet, Take 1 tablet (4 mg total) by mouth  at bedtime., Disp: 30 tablet, Rfl: 5 fluticasone-salmeterol (ADVAIR HFA) 115-21 MCG/ACT inhaler, Inhale 2 puffs into the lungs 2 (two) times daily., Disp: , Rfl: ;  furosemide (LASIX) 20 MG tablet, Take 1 tablet (20 mg total) by mouth daily., Disp: 30 tablet, Rfl: 5;  losartan (COZAAR) 100 MG tablet, Take 1 tablet (100 mg total) by mouth daily., Disp: 30 tablet, Rfl: 3;  metoprolol (LOPRESSOR) 100 MG tablet, Take 1 tablet (100 mg total) by mouth daily., Disp: 90 tablet, Rfl: 1 potassium chloride SA (K-DUR,KLOR-CON) 20 MEQ tablet, Take 1 tablet (20 mEq total) by mouth daily., Disp: 30 tablet, Rfl: 5;  tiotropium (SPIRIVA) 18 MCG inhalation capsule, Place 18 mcg into inhaler and inhale daily., Disp: , Rfl: ;  budesonide-formoterol (SYMBICORT) 160-4.5 MCG/ACT inhaler, Take 2  puffs first thing in am and then another 2 puffs about 12 hours later., Disp: 1 Inhaler, Rfl: 12 butalbital-acetaminophen-caffeine (FIORICET) 50-325-40 MG per tablet, Take 1 tablet by mouth 2 (two) times daily as needed for headache., Disp: , Rfl: ;  cloNIDine (CATAPRES) 0.1 MG tablet, Take 0.1 mg by mouth 2 (two) times daily as needed., Disp: , Rfl:   EXAM:  Filed Vitals:   02/08/13 1116  BP: 130/80  Temp: 98.3 F (36.8 C)    Body mass index is 28.94 kg/(m^2).  GENERAL: vitals reviewed and listed above, alert, oriented, appears well hydrated and in no acute distress  HEENT: atraumatic, conjunttiva clear, no obvious abnormalities on inspection of external nose and ears  NECK: no obvious masses on inspection  LUNGS: clear to auscultation bilaterally, no wheezes, rales or rhonchi, good air movement  CV: HRRR, no peripheral edema  MS: moves all extremities without noticeable abnormality  PSYCH: pleasant and cooperative, no obvious depression or anxiety  ASSESSMENT AND PLAN:  Discussed the following assessment and plan:  HTN (hypertension)  COPD GOLD III  Chronic kidney disease  CAD (coronary artery disease) of artery bypass graft  Encounter to establish care   -We reviewed the PMH, PSH, FH, SH, Meds and Allergies. -We provided refills for any medications we will prescribe as needed. -We addressed current concerns per orders and patient instructions. -We have asked for records for pertinent exams, studies, vaccines and notes from previous providers. -We have advised patient to follow up per instructions below. -doing well -discussed if persistent elevated BP at night >140/90 would discuss bid dosing of BB or HCTZ instead of lasix with his cardiologist -advised slowly increasing exercise as lung disease tolerates, healthy diet and weight loss -reviewed recent labs and notes from specialis -follow up in 3-4 months and as needed   -Patient advised to return or notify a  doctor immediately if symptoms worsen or persist or new concerns arise.  Patient Instructions   -PLEASE SIGN UP FOR MYCHART TODAY   We recommend the following healthy lifestyle measures: - eat a healthy diet consisting of lots of vegetables, fruits, beans, nuts, seeds, healthy meats such as white chicken and fish and whole grains.  - avoid fried foods, fast food, processed foods, sodas, red meet and other fattening foods.  - get a least 150 minutes of aerobic exercise per week.   -if persistent elevated night time BP after sitting for 5 minutes higher the 140/90 on average please discuss with your cardiologist - could possibly take metoprolol twice daily or change lasix to HCTZ depending on cardiologist recommendations.  Follow up in: 3-4 months      Dajion Bickford R.

## 2013-02-24 ENCOUNTER — Ambulatory Visit: Payer: Medicare PPO | Admitting: Family Medicine

## 2013-02-27 ENCOUNTER — Ambulatory Visit: Payer: Medicare PPO | Admitting: Family Medicine

## 2013-03-14 ENCOUNTER — Ambulatory Visit: Payer: Medicare PPO | Admitting: Family Medicine

## 2013-03-30 ENCOUNTER — Ambulatory Visit (INDEPENDENT_AMBULATORY_CARE_PROVIDER_SITE_OTHER): Payer: Medicare PPO | Admitting: Family Medicine

## 2013-03-30 ENCOUNTER — Encounter: Payer: Self-pay | Admitting: Family Medicine

## 2013-03-30 VITALS — BP 136/64 | HR 61 | Temp 97.7°F | Wt 186.0 lb

## 2013-03-30 DIAGNOSIS — M25579 Pain in unspecified ankle and joints of unspecified foot: Secondary | ICD-10-CM

## 2013-03-30 DIAGNOSIS — M25571 Pain in right ankle and joints of right foot: Secondary | ICD-10-CM

## 2013-03-30 DIAGNOSIS — Z23 Encounter for immunization: Secondary | ICD-10-CM

## 2013-03-30 NOTE — Patient Instructions (Signed)

## 2013-03-30 NOTE — Progress Notes (Signed)
  Subjective:    Patient ID: Terry [name], male    DOB: Jul 23, 1934, 78 y.o.   MRN: [id number]  HPI Patient seen for acute visit. Right ankle pain. He is not sure but he thinks he may have twisted his ankle with inversion-type injury doing yard work about a week ago. He relates about 5 years ago a" ligament injury" involving the right foot/ankle never had any x-rays. Location is lateral ankle below the lateral malleolus. Has not noted any ecchymosis, redness, or warmth. No history of gout. He avoids nonsteroidals secondary to chronic kidney disease. He is taken some ibuprofen with minimal relief. No ankle instability. Also having some right knee pain which has been more chronic. No swelling.  Past Medical History  Diagnosis Date  . Stroke 2012  . High cholesterol   . Hypertension   . Kidney disease   . COPD (chronic obstructive pulmonary disease)   . CAD (coronary artery disease) [place], [name]  . Acute and chronic respiratory failure 10/22/2012  . Osteoarthritis 10/22/2012  . Migraine    Past Surgical History  Procedure Laterality Date  . Coronary stent placement    . Appendectomy  1984  . Colon surgery  2012    partial colon removed - twisted bowel    reports that he quit smoking about 13 years ago. His smoking use included Cigarettes, Pipe, and Cigars. He has a 49 pack-year smoking history. He has never used smokeless tobacco. He reports that he does not drink alcohol or use illicit drugs. family history includes Cancer in his mother and sister; Heart attack in his father; Heart disease in his father; Parkinson's disease in his brother. Allergies  Allergen Reactions  . Other     Yellow Dye-- burns arms      Review of Systems  Constitutional: Negative for fever and chills.  Musculoskeletal: Positive for arthralgias.       Objective:   Physical Exam  Constitutional: He appears well-developed and well-nourished.  Cardiovascular: Normal rate and regular rhythm.    Musculoskeletal:  Right knee reveals no effusion. No warmth. No erythema. Good range of motion but does have some crepitus.  Right ankle reveals no ecchymosis. No erythema. No warmth. He has full range of motion but has tenderness with palpation inferior to lateral malleolus. He has no distal fibular tenderness. Some pain with inversion.          Assessment & Plan:  Right ankle pain. Question ligamentous related to recent sprain. No evidence for instability. Ace wrap applied. Avoid nonsteroidals with history of chronic kidney disease. Ice, elevation, compression.

## 2013-04-14 ENCOUNTER — Other Ambulatory Visit: Payer: Self-pay | Admitting: *Deleted

## 2013-04-14 MED ORDER — AMLODIPINE BESYLATE 10 MG PO TABS: 10.0000 mg | ORAL_TABLET | Freq: Every morning | ORAL | Status: DC

## 2013-05-10 ENCOUNTER — Telehealth: Payer: Self-pay | Admitting: Nurse Practitioner

## 2013-05-10 DIAGNOSIS — I10 Essential (primary) hypertension: Secondary | ICD-10-CM

## 2013-05-11 MED ORDER — LOSARTAN POTASSIUM 100 MG PO TABS: 100.0000 mg | ORAL_TABLET | Freq: Every day | ORAL | Status: DC

## 2013-05-11 NOTE — Telephone Encounter (Signed)
done

## 2013-05-15 ENCOUNTER — Other Ambulatory Visit: Payer: Self-pay | Admitting: *Deleted

## 2013-05-15 MED ORDER — ATORVASTATIN CALCIUM 10 MG PO TABS: 10.0000 mg | ORAL_TABLET | Freq: Every day | ORAL | Status: DC

## 2013-05-26 ENCOUNTER — Other Ambulatory Visit (HOSPITAL_BASED_OUTPATIENT_CLINIC_OR_DEPARTMENT_OTHER): Payer: Self-pay | Admitting: Internal Medicine

## 2013-05-26 ENCOUNTER — Telehealth: Payer: Self-pay | Admitting: Internal Medicine

## 2013-05-26 MED ORDER — FLUTICASONE-SALMETEROL 115-21 MCG/ACT IN AERO: 2.0000 | INHALATION_SPRAY | Freq: Two times a day (BID) | RESPIRATORY_TRACT | Status: DC

## 2013-05-26 NOTE — Telephone Encounter (Signed)
Fine with me

## 2013-05-26 NOTE — Telephone Encounter (Signed)
Pt aware rx has been sent. Nothing further needed

## 2013-05-26 NOTE — Telephone Encounter (Signed)
Called and spoke with pt and he stated that he is needing a refill of the advair. He stated that the advair worked better for him.  He is able to walk more all day long.  With the symbicort he felt like after lunch his breathing went down hill.  He is requesting to stay on the advair and would like this sent to his pharmacy.  MW please advise if ok to send in refills. Thanks  Allergies  Allergen Reactions  . Other     Yellow Dye-- burns arms

## 2013-06-05 ENCOUNTER — Other Ambulatory Visit (HOSPITAL_BASED_OUTPATIENT_CLINIC_OR_DEPARTMENT_OTHER): Payer: Self-pay | Admitting: Internal Medicine

## 2013-06-05 ENCOUNTER — Other Ambulatory Visit: Payer: Self-pay

## 2013-06-05 MED ORDER — FUROSEMIDE 20 MG PO TABS: 20.0000 mg | ORAL_TABLET | Freq: Every day | ORAL | Status: DC

## 2013-06-05 NOTE — Telephone Encounter (Signed)
I believe he sees a cardiologist for his BP? If going to get refills here, needs follow up - can give him a month to get to follow up appointment.

## 2013-06-06 ENCOUNTER — Other Ambulatory Visit: Payer: Self-pay

## 2013-06-06 MED ORDER — METOPROLOL TARTRATE 100 MG PO TABS: 100.0000 mg | ORAL_TABLET | Freq: Every day | ORAL | Status: DC

## 2013-06-06 NOTE — Telephone Encounter (Signed)
Left a message for return call.  

## 2013-06-08 ENCOUNTER — Telehealth: Payer: Self-pay

## 2013-06-08 NOTE — Telephone Encounter (Signed)
Called and spoke with pt and pt states that the medication refill should have gone to his cardiologist.  Pt has had the medication refilled.

## 2013-06-12 ENCOUNTER — Other Ambulatory Visit: Payer: Self-pay

## 2013-06-12 MED ORDER — POTASSIUM CHLORIDE CRYS ER 20 MEQ PO TBCR: 20.0000 meq | EXTENDED_RELEASE_TABLET | Freq: Every day | ORAL | Status: DC

## 2013-06-26 ENCOUNTER — Telehealth: Payer: Self-pay | Admitting: Internal Medicine

## 2013-06-26 MED ORDER — TIOTROPIUM BROMIDE MONOHYDRATE 18 MCG IN CAPS: 18.0000 ug | ORAL_CAPSULE | Freq: Every day | RESPIRATORY_TRACT | Status: DC

## 2013-06-26 NOTE — Telephone Encounter (Signed)
Called and spoke with pt and he is aware of refills of the spiriva that has been sent to the pharmacy.  Pt is aware of appt scheduled for 2/17 at 1:30.  Nothing further is needed.

## 2013-06-30 ENCOUNTER — Encounter: Payer: Self-pay | Admitting: Family Medicine

## 2013-06-30 ENCOUNTER — Ambulatory Visit (INDEPENDENT_AMBULATORY_CARE_PROVIDER_SITE_OTHER): Payer: Medicare PPO | Admitting: Family Medicine

## 2013-06-30 ENCOUNTER — Other Ambulatory Visit: Payer: Self-pay | Admitting: Family Medicine

## 2013-06-30 ENCOUNTER — Ambulatory Visit (INDEPENDENT_AMBULATORY_CARE_PROVIDER_SITE_OTHER)
Admission: RE | Admit: 2013-06-30 | Discharge: 2013-06-30 | Disposition: A | Discharge status: Home / Self Care | Payer: Medicare PPO | Source: Ambulatory Visit | Attending: Family Medicine | Admitting: Family Medicine

## 2013-06-30 VITALS — BP 112/60 | HR 59 | Temp 97.9°F | Wt 191.0 lb

## 2013-06-30 DIAGNOSIS — M1990 Unspecified osteoarthritis, unspecified site: Secondary | ICD-10-CM

## 2013-06-30 DIAGNOSIS — M25561 Pain in right knee: Principal | ICD-10-CM

## 2013-06-30 DIAGNOSIS — M25569 Pain in unspecified knee: Secondary | ICD-10-CM

## 2013-06-30 DIAGNOSIS — G8929 Other chronic pain: Secondary | ICD-10-CM

## 2013-06-30 NOTE — Progress Notes (Signed)
Chief Complaint  Patient presents with  . follow up foot and right knee    HPI:  R knee pain: -reports arthritis in the knee for many years -worse with increased activity, better rest -sometimes hurts at night too - takes aleve -medial joint line for the most part, achy pain   ROS: See pertinent positives and negatives per HPI.  Past Medical History  Diagnosis Date  . Stroke 2012  . High cholesterol   . Hypertension   . Kidney disease   . COPD (chronic obstructive pulmonary disease)   . CAD (coronary artery disease) [place], [name]  . Acute and chronic respiratory failure 10/22/2012  . Osteoarthritis 10/22/2012  . Migraine     Past Surgical History  Procedure Laterality Date  . Coronary stent placement    . Appendectomy  1984  . Colon surgery  2012    partial colon removed - twisted bowel    Family History  Problem Relation Age of Onset  . Cancer Mother   . Heart disease Father   . Heart attack Father   . Cancer Sister   . Parkinson's disease Brother     History   Social History  . Marital Status: Married    Spouse Name: N/A    Number of Children: 3  . Years of Education: N/A   Occupational History  . retired-worked in radio    Social History Main Topics  . Smoking status: Former Smoker -- 1.00 packs/day for 49 years    Types: Cigarettes, Pipe, Cigars    Quit date: 02/21/2000  . Smokeless tobacco: Never Used  . Alcohol Use: No  . Drug Use: No  . Sexual Activity: None   Other Topics Concern  . None   Social History Narrative   Work or School: retired [employer] - [employer]      Home Situation: lives with wife in [place] regular; poor diet             Current outpatient prescriptions:amLODipine (NORVASC) 10 MG tablet, Take 1 tablet (10 mg total) by mouth every morning., Disp: 30 tablet, Rfl: 2;  aspirin 325 MG EC tablet, Take 162 mg by mouth daily., Disp: , Rfl: ;  atorvastatin (LIPITOR)  10 MG tablet, Take 1 tablet (10 mg total) by mouth daily., Disp: 30 tablet, Rfl: 1 butalbital-acetaminophen-caffeine (FIORICET) 50-325-40 MG per tablet, Take 1 tablet by mouth 2 (two) times daily as needed for headache., Disp: , Rfl: ;  cloNIDine (CATAPRES) 0.1 MG tablet, Take 0.1 mg by mouth 2 (two) times daily as needed., Disp: , Rfl: ;  doxazosin (CARDURA) 4 MG tablet, Take 1 tablet (4 mg total) by mouth at bedtime., Disp: 30 tablet, Rfl: 5 fluticasone-salmeterol (ADVAIR HFA) 115-21 MCG/ACT inhaler, Inhale 2 puffs into the lungs 2 (two) times daily., Disp: 1 Inhaler, Rfl: 3;  furosemide (LASIX) 20 MG tablet, Take 1 tablet (20 mg total) by mouth daily., Disp: 30 tablet, Rfl: 1;  losartan (COZAAR) 100 MG tablet, Take 1 tablet (100 mg total) by mouth daily., Disp: 30 tablet, Rfl: 2 metoprolol (LOPRESSOR) 100 MG tablet, Take 1 tablet (100 mg total) by mouth daily., Disp: 90 tablet, Rfl: 3;  potassium chloride SA (K-DUR,KLOR-CON) 20 MEQ tablet, Take 1 tablet (20 mEq total) by mouth daily., Disp: 30 tablet, Rfl: 5;  tiotropium (SPIRIVA) 18 MCG inhalation capsule, Place 1 capsule (18 mcg total) into inhaler and inhale  daily., Disp: 30 capsule, Rfl: 2  EXAM:  Filed Vitals:   06/30/13 1333  BP: 112/60  Pulse: 59  Temp: 97.9 F (36.6 C)    Body mass index is 30.37 kg/(m^2).  GENERAL: vitals reviewed and listed above, alert, oriented, appears well hydrated and in no acute distress  HEENT: atraumatic, conjunttiva clear, no obvious abnormalities on inspection of external nose and ears  NECK: no obvious masses on inspection  LUNGS: clear to auscultation bilaterally, no wheezes, rales or rhonchi, good air movement  CV: HRRR, no peripheral edema  MS: moves all extremities without noticeable abnormality -gait normal -normal appearance of knees and ankles -petellar crepitis and medial jt line TTP -otherwise normal exam of the knee and leg, NV intact distally  PSYCH: pleasant and cooperative, no  obvious depression or anxiety  ASSESSMENT AND PLAN:  Discussed the following assessment and plan:  Knee pain, chronic, right - Plan: DG Knee Complete 4 Views Right  -suspect OA -xrays, depending on findings may refer to ortho - he prefers [facility] -tylenol and gentle activity -Patient advised to return or notify a doctor immediately if symptoms worsen or persist or new concerns arise.  Patient Instructions  -gentle activity  -tylenol 500-1000mg  up to 3 times per day  -go get xrays at [facility] office  -follow up in 3-4 months or as needed      Terry Sokolov R.

## 2013-06-30 NOTE — Progress Notes (Signed)
Pre visit review using our clinic review tool, if applicable. No additional management support is needed unless otherwise documented below in the visit note. 

## 2013-06-30 NOTE — Patient Instructions (Signed)
-  gentle activity  -tylenol 500-1000mg  up to 3 times per day  -go get xrays at [facility]  -follow up in 3-4 months or as needed

## 2013-07-11 ENCOUNTER — Other Ambulatory Visit: Payer: Self-pay | Admitting: *Deleted

## 2013-07-11 MED ORDER — DOXAZOSIN MESYLATE 4 MG PO TABS: 4.0000 mg | ORAL_TABLET | Freq: Every day | ORAL | Status: DC

## 2013-07-19 ENCOUNTER — Other Ambulatory Visit: Payer: Self-pay | Admitting: Cardiovascular Disease

## 2013-07-19 ENCOUNTER — Other Ambulatory Visit: Payer: Self-pay

## 2013-07-19 MED ORDER — AMLODIPINE BESYLATE 10 MG PO TABS: 10.0000 mg | ORAL_TABLET | Freq: Every morning | ORAL | Status: DC

## 2013-08-07 ENCOUNTER — Telehealth: Payer: Self-pay | Admitting: Nurse Practitioner

## 2013-08-07 ENCOUNTER — Other Ambulatory Visit: Payer: Self-pay | Admitting: Cardiovascular Disease

## 2013-08-07 MED ORDER — DOXAZOSIN MESYLATE 4 MG PO TABS: 4.0000 mg | ORAL_TABLET | Freq: Every day | ORAL | Status: DC

## 2013-08-07 NOTE — Telephone Encounter (Signed)
Phone call today -   Needs Doxazosin refilled. He has a visit for later this month but will be out of medicine  I have refilled the Doxazosin 4mg  QHS #30 with no refills.  Reminded to keep OV.  Patient is agreeable to this plan and will call if any problems develop in the interim.   Burtis Junes, RN, [employer] [address] [place], [place]  [phone]

## 2013-08-08 ENCOUNTER — Ambulatory Visit: Payer: Medicare PPO | Admitting: Internal Medicine

## 2013-08-10 ENCOUNTER — Other Ambulatory Visit: Payer: Self-pay | Admitting: *Deleted

## 2013-08-10 ENCOUNTER — Other Ambulatory Visit: Payer: Self-pay | Admitting: Nurse Practitioner

## 2013-08-10 DIAGNOSIS — I10 Essential (primary) hypertension: Secondary | ICD-10-CM

## 2013-08-10 MED ORDER — LOSARTAN POTASSIUM 100 MG PO TABS: 100.0000 mg | ORAL_TABLET | Freq: Every day | ORAL | Status: DC

## 2013-08-10 MED ORDER — FUROSEMIDE 20 MG PO TABS: 20.0000 mg | ORAL_TABLET | Freq: Every day | ORAL | Status: DC

## 2013-08-15 ENCOUNTER — Encounter: Payer: Self-pay | Admitting: Internal Medicine

## 2013-08-15 ENCOUNTER — Ambulatory Visit (INDEPENDENT_AMBULATORY_CARE_PROVIDER_SITE_OTHER): Payer: Medicare PPO | Admitting: Internal Medicine

## 2013-08-15 VITALS — BP 142/76 | HR 61 | Temp 97.5°F | Ht 67.0 in | Wt 192.8 lb

## 2013-08-15 DIAGNOSIS — J449 Chronic obstructive pulmonary disease, unspecified: Secondary | ICD-10-CM

## 2013-08-15 MED ORDER — TIOTROPIUM BROMIDE MONOHYDRATE 18 MCG IN CAPS: 18.0000 ug | ORAL_CAPSULE | Freq: Every day | RESPIRATORY_TRACT | Status: DC

## 2013-08-15 MED ORDER — FLUTICASONE-SALMETEROL 115-21 MCG/ACT IN AERO: 2.0000 | INHALATION_SPRAY | Freq: Two times a day (BID) | RESPIRATORY_TRACT | Status: DC

## 2013-08-15 NOTE — Assessment & Plan Note (Addendum)
-   off Cigs since 2001 - Trial off ACEi 11/30/2012 >>> much better symptom control - PFT's 01/26/13  FEV1  1.28 (48%) ratio 48 and no better p B2,  DLCO 39 corrects to 48% - hfa 75% p coaching 01/26/13   Much better off acei on on combination laba/lama/ics to point where could question whether he would do just as well on laba/lama as no tendency to aecopd but he wants to leave well enough alone for now and f/u yearly for refills (ok to shift to Dr Maudie Mercury for refills at his discretion)    Each maintenance medication was reviewed in detail including most importantly the difference between maintenance and as needed and under what circumstances the prns are to be used.  Please see instructions for details which were reviewed in writing and the patient given a copy.

## 2013-08-15 NOTE — Patient Instructions (Signed)
Try to work on weight   No change on medications.   Prevar is the last pneumonia shot you'll ever need.   Return in one year for follow up unless Dr Maudie Mercury assumes your pulmonary follow and refills in meantime

## 2013-08-15 NOTE — Progress Notes (Signed)
Subjective:    Patient ID: Terry [name], male    DOB: February 07, 1935  MRN: [id number]   Brief patient profile:  56 yowm quit smoking 2001 bothered by doe seemed better then worse in 2010 p "lung infection" and stayed on advair/spiriva  Combo since 2012 seemed better then admitted in [facility] with pna 10/09/12  in then transferred to [facility] x 3 weeks and discharged  On May 1st referred to pulmonary by [facility] for copd eval and proved to have GOLD III copd 01/26/13 with symptoms much better off ACEi   HPI 11/29/2012 1st pulmonary ov/ Terry [name]/ on ACEi/ cc doe x maybe a block of walking but not back to baseline and able to do 5 steps and has proaire but not needing. Also persistent dry daytime cough x months >d/c ACE     7/281/4 Follow up and Med review  Pt returns for 1 month follow up and med review  We reviewed all his meds organized them into a med calendar with pt education .  Pt is unclear on his medications and takes b/p meds "if my b/p is high"  Explained that is not a good idea. He replied that is what his doctor told him to do.  Says his cough and dyspnea are much improved. Cough is resolved and he feels much better.  pt reports breathing is doing well.  rec No change rx       01/26/2013 f/u ov/Terry [name]  Chief Complaint  Patient presents with  . Followup with PFT    Pt states that his breathing is unchanged since his last visit. No new co's today.    no longer limited by breathing  rec When advair runs out try symbicort 160 Take 2 puffs first thing in am and then another 2 puffs about 12 hours later and see if you don't like it better> liked advair hfa better  Change your bedtime medications all to evening dosing    08/15/2013 f/u ov/Terry [name] re: GOLD III Chief Complaint  Patient presents with  . Follow-up    6 month rov.  Pt c/o SOB in cold weather.  No other complaints at this time.  R knee better p injection,  Able to slow flat like at mall slt lower than average pace but not  really limited by breathing.  No need for saba, doesn't even have one or any tendency to aecopd   No obvious daytime variabilty or assoc chronic cough or cp or chest tightness, subjective wheeze overt sinus or hb symptoms. No unusual exp hx or h/o childhood pna/ asthma or knowledge of premature birth.   Sleeping ok without nocturnal  or early am exacerbation  of respiratory  c/o's or need for noct saba. Also denies any obvious fluctuation of symptoms with weather or environmental changes or other aggravating or alleviating factors except as outlined above   Current Medications, Allergies, Past Medical History, Past Surgical History, Family History, and Social History were reviewed in [employer] record.  ROS  The following are not active complaints unless bolded sore throat, dysphagia, dental problems, itching, sneezing,  nasal congestion or excess/ purulent secretions, ear ache,   fever, chills, sweats, unintended wt loss, pleuritic or exertional cp, hemoptysis,  orthopnea pnd or leg swelling, presyncope, palpitations, heartburn, abdominal pain, anorexia, nausea, vomiting, diarrhea  or change in bowel or urinary habits, change in stools or urine, dysuria,hematuria,  rash, arthralgias, visual complaints, headache, numbness weakness or ataxia or problems with walking or  coordination,  change in mood/affect or memory.            Objective:   Physical Exam  Somber slt hoarse  amb wm   08/15/2013       193  Wt Readings from Last 3 Encounters:  01/26/13 182 lb (82.555 kg)  01/26/13 182 lb (82.555 kg)  01/16/13 181 lb 6.4 oz (82.283 kg)     HEENT mild turbinate edema.  Oropharynx no thrush or excess pnd or cobblestoning.  No JVD or cervical adenopathy. Mild accessory muscle hypertrophy. Trachea midline, nl thryroid. Chest  CTA bilaterally . Regular rate and rhythm without murmur gallop or rub or increase P2 or edema.  Abd: no hsm, nl excursion. Ext warm without cyanosis  or clubbing.       CXR  11/29/2012 : 1. Emphysema.  2. Mild bibasilar atelectasis.  3. Atherosclerosis.  4. Borderline cardiomegaly.      Assessment & Plan:

## 2013-08-18 ENCOUNTER — Encounter: Payer: Self-pay | Admitting: Cardiovascular Disease

## 2013-08-18 ENCOUNTER — Ambulatory Visit (INDEPENDENT_AMBULATORY_CARE_PROVIDER_SITE_OTHER): Payer: Medicare PPO | Admitting: Cardiovascular Disease

## 2013-08-18 VITALS — BP 132/78 | HR 95 | Ht 67.0 in | Wt 190.0 lb

## 2013-08-18 DIAGNOSIS — I10 Essential (primary) hypertension: Secondary | ICD-10-CM

## 2013-08-18 DIAGNOSIS — I2510 Atherosclerotic heart disease of native coronary artery without angina pectoris: Secondary | ICD-10-CM

## 2013-08-18 MED ORDER — AMLODIPINE BESYLATE 10 MG PO TABS: 10.0000 mg | ORAL_TABLET | Freq: Every morning | ORAL | Status: DC

## 2013-08-18 NOTE — Patient Instructions (Signed)
Your physician wants you to follow-up in:  12 months.  You will receive a reminder letter in the mail two months in advance. If you don't receive a letter, please call our office to schedule the follow-up appointment.   

## 2013-08-18 NOTE — Progress Notes (Signed)
History of Present Illness: 79 yo male with history of HTN, HLD, CVA, COPD, CAD here today for cardiac follow up. Followed by cardiology in [place], [name] for many years until he moved here in 2014. Doing well over the years. Cath records received from [facility]. Cardiac cath 11/30/97. Mild distal Left main stenosis, 25% proximal LAD, minor irregularities Circumflex, 95% mid RCA stenosis treated with 3.5 x 32 mm bare metal stent. There was a ? Overlapping 3.5 x 18 mm bare metal stent more proximally. Echo 02/03/13 with LVEF=50-55%. He had normal carotid dopplers in [place] 2013.   He is here today for follow up. No chest pain or SOB. Overall feeling well.   Primary Care Physician: Dr. Colin Benton  Lipid Panel     Component Value Date/Time   CHOL 172 02/03/2013 0933   TRIG 274.0* 02/03/2013 0933   HDL 32.70* 02/03/2013 0933   CHOLHDL 5 02/03/2013 0933   VLDL 54.8* 02/03/2013 0933       LDL: 93  Past Medical History  Diagnosis Date  . Stroke 2012  . High cholesterol   . Hypertension   . Kidney disease   . COPD (chronic obstructive pulmonary disease)   . CAD (coronary artery disease) [place], [name]  . Acute and chronic respiratory failure 10/22/2012  . Osteoarthritis 10/22/2012  . Migraine     Past Surgical History  Procedure Laterality Date  . Coronary stent placement    . Appendectomy  1984  . Colon surgery  2012    partial colon removed - twisted bowel    Current Outpatient Prescriptions  Medication Sig Dispense Refill  . amLODipine (NORVASC) 10 MG tablet Take 1 tablet (10 mg total) by mouth every morning.  30 tablet  2  . aspirin 325 MG EC tablet Take 162 mg by mouth daily. Patients states that he only takes occasionally (08/18/13)      . atorvastatin (LIPITOR) 10 MG tablet Take 1 tablet (10 mg total) by mouth daily.  30 tablet  1  . cloNIDine (CATAPRES) 0.1 MG tablet Take 0.1 mg by mouth 2 (two) times daily as needed.      . doxazosin (CARDURA) 4 MG tablet Take 1  tablet (4 mg total) by mouth at bedtime.  30 tablet  0  . fluticasone-salmeterol (ADVAIR HFA) 115-21 MCG/ACT inhaler Inhale 2 puffs into the lungs 2 (two) times daily.  1 Inhaler  11  . furosemide (LASIX) 20 MG tablet Take 1 tablet (20 mg total) by mouth daily.  30 tablet  3  . losartan (COZAAR) 100 MG tablet Take 1 tablet (100 mg total) by mouth daily.  30 tablet  3  . metoprolol (LOPRESSOR) 100 MG tablet Take 1 tablet (100 mg total) by mouth daily.  90 tablet  3  . potassium chloride SA (K-DUR,KLOR-CON) 20 MEQ tablet Take 1 tablet (20 mEq total) by mouth daily.  30 tablet  5  . tiotropium (SPIRIVA) 18 MCG inhalation capsule Place 1 capsule (18 mcg total) into inhaler and inhale daily.  30 capsule  11   No current facility-administered medications for this visit.    Allergies  Allergen Reactions  . Other     Yellow Dye-- burns arms    History   Social History  . Marital Status: Married    Spouse Name: N/A    Number of Children: 3  . Years of Education: N/A   Occupational History  . retired-worked in radio  Social History Main Topics  . Smoking status: Former Smoker -- 1.00 packs/day for 49 years    Types: Cigarettes, Pipe, Cigars    Quit date: 02/21/2000  . Smokeless tobacco: Never Used  . Alcohol Use: No  . Drug Use: No  . Sexual Activity: Not on file   Other Topics Concern  . Not on file   Social History Narrative   Work or School: retired [place] Situation: lives with wife in [place] Beliefs: [place] regular; poor diet             Family History  Problem Relation Age of Onset  . Cancer Mother   . Heart disease Father   . Heart attack Father   . Cancer Sister   . Parkinson's disease Brother     Review of Systems:  As stated in the HPI and otherwise negative.   BP 132/78  Pulse 95  Ht 5\' 7"  (1.702 m)  Wt 190 lb (86.183 kg)  BMI 29.75 kg/m2  Physical Examination: General: Well developed,  well nourished, NAD HEENT: OP clear, mucus membranes moist SKIN: warm, dry. No rashes. Neuro: No focal deficits Musculoskeletal: Muscle strength 5/5 all ext Psychiatric: Mood and affect normal Neck: No JVD, no carotid bruits, no thyromegaly, no lymphadenopathy. Lungs:Clear bilaterally, no wheezes, rhonci, crackles Cardiovascular: Regular rate and rhythm. No murmurs, gallops or rubs. Abdomen:Soft. Bowel sounds present. Non-tender.  Extremities: No lower extremity edema. Pulses are 2 + in the bilateral DP/PT.  Echo 02/03/13: Left ventricle: The cavity size was normal. Wall thickness was normal. Systolic function was normal. The estimated ejection fraction was in the range of 50% to 55%. Doppler parameters are consistent with abnormal left ventricular relaxation (grade 1 diastolic dysfunction).  Assessment and Plan:   1. CAD: Stable. Continue beta blocker, Ace-inh, statin. No change in medications today.   2. HTN: BP well controlled. No changes

## 2013-09-08 ENCOUNTER — Other Ambulatory Visit: Payer: Self-pay | Admitting: Nurse Practitioner

## 2013-10-26 ENCOUNTER — Other Ambulatory Visit: Payer: Self-pay | Admitting: Cardiovascular Disease

## 2013-11-02 ENCOUNTER — Ambulatory Visit (INDEPENDENT_AMBULATORY_CARE_PROVIDER_SITE_OTHER): Payer: Medicare PPO | Admitting: Family Medicine

## 2013-11-02 ENCOUNTER — Encounter: Payer: Self-pay | Admitting: Family Medicine

## 2013-11-02 VITALS — BP 130/70 | HR 62 | Temp 97.6°F | Ht 67.0 in | Wt 195.0 lb

## 2013-11-02 DIAGNOSIS — M549 Dorsalgia, unspecified: Secondary | ICD-10-CM

## 2013-11-02 MED ORDER — CYCLOBENZAPRINE HCL 5 MG PO TABS: 5.0000 mg | ORAL_TABLET | Freq: Three times a day (TID) | ORAL | Status: DC | PRN

## 2013-11-02 NOTE — Patient Instructions (Signed)
-  heat for 15 minutes twice daily  -tylenol 1000mg  up to 3 times daily, can use aleve sparingly if this is helpful  -can try topical capsacin or menthol sports creams  -muscle relaxer as needed per instructions  -exercises provided  -follow up in 2-3 weeks or sooner if needed

## 2013-11-02 NOTE — Progress Notes (Signed)
No chief complaint on file.   HPI:  R Low back pain: -slipped in tub 5 days ago and caught himself as he fell but still hit R buttock -developed pain the next day, mod ahcy, constant, worse after being up all day, better with tylenol -denies: weakness, numbness, radiation, bowel or bladder incontinence -sees ortho for his chronic knee OA and has some DDD  ROS: See pertinent positives and negatives per HPI.  Past Medical History  Diagnosis Date  . Stroke 2012  . High cholesterol   . Hypertension   . Kidney disease   . COPD (chronic obstructive pulmonary disease)   . CAD (coronary artery disease) [place], [name]  . Acute and chronic respiratory failure 10/22/2012  . Osteoarthritis 10/22/2012  . Migraine     Past Surgical History  Procedure Laterality Date  . Coronary stent placement    . Appendectomy  1984  . Colon surgery  2012    partial colon removed - twisted bowel    Family History  Problem Relation Age of Onset  . Cancer Mother   . Heart disease Father   . Heart attack Father   . Cancer Sister   . Parkinson's disease Brother     History   Social History  . Marital Status: Married    Spouse Name: N/A    Number of Children: 3  . Years of Education: N/A   Occupational History  . retired-worked in radio    Social History Main Topics  . Smoking status: Former Smoker -- 1.00 packs/day for 49 years    Types: Cigarettes, Pipe, Cigars    Quit date: 02/21/2000  . Smokeless tobacco: Never Used  . Alcohol Use: No  . Drug Use: No  . Sexual Activity: None   Other Topics Concern  . None   Social History Narrative   Work or School: retired [employer] - [employer]      Home Situation: lives with wife in [place] regular; poor diet             Current outpatient prescriptions:amLODipine (NORVASC) 10 MG tablet, Take 1 tablet (10 mg total) by mouth every morning., Disp: 30 tablet, Rfl: 11;  aspirin 325 MG  EC tablet, Take 162 mg by mouth daily. Patients states that he only takes occasionally (08/18/13), Disp: , Rfl: ;  atorvastatin (LIPITOR) 10 MG tablet, TAKE ONE TABLET BY MOUTH ONCE DAILY, Disp: 30 tablet, Rfl: 5 doxazosin (CARDURA) 4 MG tablet, Take 1 tablet (4 mg total) by mouth daily., Disp: 30 tablet, Rfl: 5;  fluticasone-salmeterol (ADVAIR HFA) 115-21 MCG/ACT inhaler, Inhale 2 puffs into the lungs 2 (two) times daily., Disp: 1 Inhaler, Rfl: 11;  furosemide (LASIX) 20 MG tablet, Take 1 tablet (20 mg total) by mouth daily., Disp: 30 tablet, Rfl: 3;  losartan (COZAAR) 100 MG tablet, Take 1 tablet (100 mg total) by mouth daily., Disp: 30 tablet, Rfl: 3 metoprolol (LOPRESSOR) 100 MG tablet, Take 1 tablet (100 mg total) by mouth daily., Disp: 90 tablet, Rfl: 3;  potassium chloride SA (K-DUR,KLOR-CON) 20 MEQ tablet, Take 1 tablet (20 mEq total) by mouth daily., Disp: 30 tablet, Rfl: 5;  tiotropium (SPIRIVA) 18 MCG inhalation capsule, Place 1 capsule (18 mcg total) into inhaler and inhale daily., Disp: 30 capsule, Rfl: 11 cyclobenzaprine (FLEXERIL) 5 MG tablet, Take 1 tablet (5 mg total) by mouth 3 (three) times daily as needed for muscle  spasms., Disp: 20 tablet, Rfl: 1  EXAM:  Filed Vitals:   11/02/13 1506  BP: 130/70  Pulse: 62  Temp: 97.6 F (36.4 C)    Body mass index is 30.53 kg/(m^2).  GENERAL: vitals reviewed and listed above, alert, oriented, appears well hydrated and in no acute distress  HEENT: atraumatic, conjunttiva clear, no obvious abnormalities on inspection of external nose and ears  NECK: no obvious masses on inspection  LUNGS: clear to auscultation bilaterally, no wheezes, rales or rhonchi, good air movement  CV: HRRR, no peripheral edema  MS: moves all extremities without noticeable abnormality Normal Gait Normal inspection of back, no obvious scoliosis or leg length descrepancy No bony TTP Soft tissue TTP at: R upper glute max medially - no of SI joint and does not  seem to be bony TTP -/+ tests: neg trendelenburg,-facet loading, -SLRT, -CLRT, -FABER, -FADIR Normal muscle strength, sensation to light touch and DTRs in LEs bilaterally  PSYCH: pleasant and cooperative, no obvious depression or anxiety  ASSESSMENT AND PLAN:  Discussed the following assessment and plan:  Back pain - Plan: cyclobenzaprine (FLEXERIL) 5 MG tablet -we discussed possible serious and likely etiologies, workup and treatment, treatment risks and return precautions - likely soft tissue contusion/strain, but offered plain films of hip/pelvis  -after this discussion, Terry [name] opted for conservative tx with muscle relaxer, tylenol, HEP, heat  -follow up advised in 2-3 weeks or sooner if worsening or other concerns   -Patient advised to return or notify a doctor immediately if symptoms worsen or persist or new concerns arise.  Patient Instructions  -heat for 15 minutes twice daily  -tylenol 1000mg  up to 3 times daily, can use aleve sparingly if this is helpful  -can try topical capsacin or menthol sports creams  -muscle relaxer as needed per instructions  -exercises provided  -follow up in 2-3 weeks or sooner if needed       [name]

## 2013-11-02 NOTE — Progress Notes (Signed)
Pre visit review using our clinic review tool, if applicable. No additional management support is needed unless otherwise documented below in the visit note. 

## 2013-11-21 ENCOUNTER — Telehealth: Payer: Self-pay | Admitting: Family Medicine

## 2013-11-21 NOTE — Telephone Encounter (Signed)
Patient Information:  Caller Name: Thayer Headings  Phone: [phone]  Patient: Terry [name], Terry [name]  Gender: Male  DOB: 1935/02/21  Age: 79 Years  PCP: Maudie Mercury (TEXT 1st, after 20 mins can call), [facility])  Office Follow Up:  Does the office need to follow up with this patient?: No  Instructions For The Office: N/A  RN Note:  Patient refused earlier appointment.   Symptoms  Reason For Call & Symptoms: Reports chills earlier today after eating lunch.  Reviewed Health History In EMR: Yes  Reviewed Medications In EMR: Yes  Reviewed Allergies In EMR: Yes  Reviewed Surgeries / Procedures: Yes  Date of Onset of Symptoms: 11/21/2013  Treatments Tried: Tylenol arthritis  Treatments Tried Worked: Yes  Guideline(s) Used:  No Protocol Available - Sick Adult  Disposition Per Guideline:   See Within 3 Days in Office  Reason For Disposition Reached:   Nursing judgment  Advice Given:  N/A  Patient Will Follow Care Advice:  YES  Appointment Scheduled:  11/22/2013 11:00:00 Appointment Scheduled Provider:  Maudie Mercury (TEXT 1st, after 20 mins can call), [facility] S[facility])

## 2013-11-22 ENCOUNTER — Encounter: Payer: Self-pay | Admitting: Family Medicine

## 2013-11-22 NOTE — Progress Notes (Signed)
Error   This encounter was created in error - please disregard. 

## 2013-12-01 ENCOUNTER — Ambulatory Visit (INDEPENDENT_AMBULATORY_CARE_PROVIDER_SITE_OTHER): Payer: Medicare PPO | Admitting: Family Medicine

## 2013-12-01 ENCOUNTER — Encounter: Payer: Self-pay | Admitting: Family Medicine

## 2013-12-01 VITALS — BP 134/72 | HR 62 | Temp 98.6°F | Wt 196.0 lb

## 2013-12-01 DIAGNOSIS — J449 Chronic obstructive pulmonary disease, unspecified: Secondary | ICD-10-CM

## 2013-12-01 DIAGNOSIS — B370 Candidal stomatitis: Secondary | ICD-10-CM

## 2013-12-01 DIAGNOSIS — I10 Essential (primary) hypertension: Secondary | ICD-10-CM

## 2013-12-01 DIAGNOSIS — N189 Chronic kidney disease, unspecified: Secondary | ICD-10-CM

## 2013-12-01 DIAGNOSIS — R5383 Other fatigue: Secondary | ICD-10-CM

## 2013-12-01 DIAGNOSIS — R5381 Other malaise: Secondary | ICD-10-CM

## 2013-12-01 LAB — HEPATIC FUNCTION PANEL
ALBUMIN: 3.7 g/dL (ref 3.5–5.2)
ALT: 15 U/L (ref 0–53)
AST: 16 U/L (ref 0–37)
Alkaline Phosphatase: 47 U/L (ref 39–117)
Bilirubin, Direct: 0 mg/dL (ref 0.0–0.3)
Total Bilirubin: 0.2 mg/dL (ref 0.2–1.2)
Total Protein: 6.9 g/dL (ref 6.0–8.3)

## 2013-12-01 LAB — CBC WITH DIFFERENTIAL/PLATELET
BASOS PCT: 0.6 % (ref 0.0–3.0)
Basophils Absolute: 0.1 10*3/uL (ref 0.0–0.1)
EOS ABS: 0.3 10*3/uL (ref 0.0–0.7)
Eosinophils Relative: 3.6 % (ref 0.0–5.0)
HCT: 31.8 % — ABNORMAL LOW (ref 39.0–52.0)
HEMOGLOBIN: 10.7 g/dL — AB (ref 13.0–17.0)
Lymphocytes Relative: 14.3 % (ref 12.0–46.0)
Lymphs Abs: 1.2 10*3/uL (ref 0.7–4.0)
MCHC: 33.6 g/dL (ref 30.0–36.0)
MCV: 89.7 fl (ref 78.0–100.0)
Monocytes Absolute: 0.8 10*3/uL (ref 0.1–1.0)
Monocytes Relative: 9.1 % (ref 3.0–12.0)
NEUTROS ABS: 6.3 10*3/uL (ref 1.4–7.7)
NEUTROS PCT: 72.4 % (ref 43.0–77.0)
Platelets: 331 10*3/uL (ref 150.0–400.0)
RBC: 3.55 Mil/uL — ABNORMAL LOW (ref 4.22–5.81)
RDW: 13.2 % (ref 11.5–15.5)
WBC: 8.8 10*3/uL (ref 4.0–10.5)

## 2013-12-01 LAB — BASIC METABOLIC PANEL
BUN: 35 mg/dL — ABNORMAL HIGH (ref 6–23)
CALCIUM: 8.9 mg/dL (ref 8.4–10.5)
CHLORIDE: 100 meq/L (ref 96–112)
CO2: 25 meq/L (ref 19–32)
CREATININE: 2.3 mg/dL — AB (ref 0.4–1.5)
GFR: 30.24 mL/min — ABNORMAL LOW (ref >60.00)
GLUCOSE: 97 mg/dL (ref 70–99)
Potassium: 5.7 mEq/L — ABNORMAL HIGH (ref 3.5–5.1)
Sodium: 130 mEq/L — ABNORMAL LOW (ref 135–145)

## 2013-12-01 LAB — TSH: TSH: 0.87 u[IU]/mL (ref 0.35–4.50)

## 2013-12-01 MED ORDER — ALBUTEROL SULFATE HFA 108 (90 BASE) MCG/ACT IN AERS: 2.0000 | INHALATION_SPRAY | Freq: Four times a day (QID) | RESPIRATORY_TRACT | Status: DC | PRN

## 2013-12-01 MED ORDER — NYSTATIN 100000 UNIT/ML MT SUSP: 5.0000 mL | Freq: Four times a day (QID) | OROMUCOSAL | Status: DC

## 2013-12-01 NOTE — Progress Notes (Signed)
Subjective:    Patient ID: Terry [name], male    DOB: 1934/08/16, 79 y.o.   MRN: [id number]  HPI Patient seen with nonspecific symptoms of fatigue. History of CAD, hypertension, hyperlipidemia, COPD, chronic kidney disease. He was apparently seen in urgent care couple weeks ago and diagnosed with prostatitis. He was given some sort of intramuscular antibiotic injection and treated with 10 days of Cipro. He denied any dysuria. He has persistent fatigue. Increased sleepiness. No other medication changes recently. He did not have any recent lab work.  Possible low-grade fever past couple days but not confirmed. No chills. No urinary symptoms. He has some cough but this is somewhat chronic with COPD no productive cough. No chest pains. No sore throat. No abdominal pain. No stool changes.  Past Medical History  Diagnosis Date  . Stroke 2012  . High cholesterol   . Hypertension   . Kidney disease   . COPD (chronic obstructive pulmonary disease)   . CAD (coronary artery disease) [place], [name]  . Acute and chronic respiratory failure 10/22/2012  . Osteoarthritis 10/22/2012  . Migraine    Past Surgical History  Procedure Laterality Date  . Coronary stent placement    . Appendectomy  1984  . Colon surgery  2012    partial colon removed - twisted bowel    reports that he quit smoking about 13 years ago. His smoking use included Cigarettes, Pipe, and Cigars. He has a 49 pack-year smoking history. He has never used smokeless tobacco. He reports that he does not drink alcohol or use illicit drugs. family history includes Cancer in his mother and sister; Heart attack in his father; Heart disease in his father; Parkinson's disease in his brother. Allergies  Allergen Reactions  . Other     Yellow Dye-- burns arms      Review of Systems  Constitutional: Positive for fatigue. Negative for fever, chills, appetite change and unexpected weight change.  HENT: Negative for sore throat and  trouble swallowing.   Respiratory: Positive for cough and shortness of breath (Chronic). Negative for wheezing.   Cardiovascular: Negative for chest pain, palpitations and leg swelling.  Gastrointestinal: Negative for nausea, vomiting, abdominal pain, diarrhea, constipation and blood in stool.  Endocrine: Negative for polydipsia and polyuria.  Genitourinary: Negative for dysuria.  Musculoskeletal: Negative for back pain.  Skin: Negative for rash.  Neurological: Negative for dizziness and headaches.  Psychiatric/Behavioral: Negative for confusion.       Objective:   Physical Exam  Constitutional: He appears well-developed and well-nourished.  HENT:  Right Ear: External ear normal.  Left Ear: External ear normal.  Patient has a few whitish patches posterior aspect hard palate and soft palate. Typical of thrush  Neck: Neck supple.  Cardiovascular: Normal rate and regular rhythm.   Pulmonary/Chest: Effort normal. No respiratory distress. He has no wheezes. He has no rales.  Somewhat diminished breath sounds throughout. No rales. No wheezes.  Abdominal: Soft. There is no tenderness.  Genitourinary: Rectum normal and prostate normal.  Musculoskeletal: He exhibits no edema.          Assessment & Plan:  #1 fatigue. Symptoms are very vague and nonspecific. He does not have evidence for persistent or chronic prostatitis by exam. He is on multiple medications and has not had any recent lab work. We'll check CBC, chemistries, TSH. #2 thrush. Probably related to use of Advair. He is instructed to rinse mouth consistently after use. Nystatin oral suspension 4  times daily until clear #3 COPD. Patient requesting rescue inhaler which he rarely uses. Refill Proventil

## 2013-12-01 NOTE — Patient Instructions (Signed)
Thrush, Adult  Thrush, also called oral candidiasis, is a fungal infection that develops in the mouth and throat and on the tongue. It causes white patches to form on the mouth and tongue. Thrush is most common in older adults, but it can occur at any age.  Many cases of thrush are mild, but this infection can also be more serious. Thrush can be a recurring problem for people who have chronic illnesses or who take medicines that limit the body's ability to fight infection. Because these people have difficulty fighting infections, the fungus that causes thrush can spread throughout the body. This can cause life-threatening blood or organ infections. CAUSES  Thrush is usually caused by a yeast called Candida albicans. This fungus is normally present in small amounts in the mouth and on other mucous membranes. It usually causes no harm. However, when conditions are present that allow the fungus to grow uncontrolled, it invades surrounding tissues and becomes an infection. Less often, other Candida species can also lead to thrush.  RISK FACTORS Thrush is more likely to develop in the following people:  People with an impaired ability to fight infection (weakened immune system).   Older adults.   People with HIV.   People with diabetes.   People with dry mouth (xerostomia).   Pregnant women.   People with poor dental care, especially those who have false teeth.   People who use antibiotic medicines.  SIGNS AND SYMPTOMS  Thrush can be a mild infection that causes no symptoms. If symptoms develop, they may include:   A burning feeling in the mouth and throat. This can occur at the start of a thrush infection.   White patches that adhere to the mouth and tongue. The tissue around the patches may be red, raw, and painful. If rubbed (during tooth brushing, for example), the patches and the tissue of the mouth may bleed easily.   A bad taste in the mouth or difficulty tasting foods.    Cottony feeling in the mouth.   Pain during eating and swallowing. DIAGNOSIS  Your health care provider can usually diagnose thrush by looking in your mouth and asking you questions about your health.  TREATMENT  Medicines that help prevent the growth of fungi (antifungals) are the standard treatment for thrush. These medicines are either applied directly to the affected area (topical) or swallowed (oral). The treatment will depend on the severity of the condition.  Mild Thrush Mild cases of thrush may clear up with the use of an antifungal mouth rinse or lozenges. Treatment usually lasts about 14 days.  Moderate to Severe Thrush  More severe thrush infections that have spread to the esophagus are treated with an oral antifungal medicine. A topical antifungal medicine may also be used.   For some severe infections, a treatment period longer than 14 days may be needed.   Oral antifungal medicines are almost never used during pregnancy because the fetus may be harmed. However, if a pregnant woman has a rare, severe thrush infection that has spread to her blood, oral antifungal medicines may be used. In this case, the risk of harm to the mother and fetus from the severe thrush infection may be greater than the risk posed by the use of antifungal medicines.  Persistent or Recurrent Thrush For cases of thrush that do not go away or keep coming back, treatment may involve the following:   Treatment may be needed twice as long as the symptoms last.   Treatment will   include both oral and topical antifungal medicines.   People with weakened immune systems can take an antifungal medicine on a continuous basis to prevent thrush infections.  It is important to treat conditions that make you more likely to get thrush, such as diabetes or HIV.  HOME CARE INSTRUCTIONS   Only take over-the-counter or prescription medicine as directed by your health care provider. Talk to your health care  provider about an over-the-counter medicine called gentian violet, which kills bacteria and fungi.   Eat plain, unflavored yogurt as directed by your health care provider. Check the label to make sure the yogurt contains live cultures. This yogurt can help healthy bacteria grow in the mouth that can stop the growth of the fungus that causes thrush.   Try these measures to help reduce the discomfort of thrush:   Drink cold liquids such as water or iced tea.   Try flavored ice treats or frozen juices.   Eat foods that are easy to swallow, such as gelatin, ice cream, or custard.   If the patches in your mouth are painful, try drinking from a straw.   Rinse your mouth several times a day with a warm saltwater rinse. You can make the saltwater mixture with 1 tsp (6 g) of salt in 8 fl oz (0.2 L) of warm water.   If you wear dentures, remove the dentures before going to bed, brush them vigorously, and soak them in a cleaning solution as directed by your health care provider.   Women who are breastfeeding should clean their nipples with an antifungal medicine as directed by their health care provider. Dry the nipples after breastfeeding. Applying lanolin-containing body lotion may help relieve nipple soreness.  SEEK MEDICAL CARE IF:  Your symptoms are getting worse or are not improving within 7 days of starting treatment.   You have symptoms of spreading infection, such as white patches on the skin outside of the mouth.   You are nursing and you have redness, burning, or pain in the nipples that is not relieved with treatment.  MAKE SURE YOU:  Understand these instructions.  Will watch your condition.  Will get help right away if you are not doing well or get worse. Document Released: 03/03/2004 Document Revised: 03/29/2013 Document Reviewed: 01/09/2013 [employer] Patient Information 2014 [employer].  

## 2013-12-01 NOTE — Progress Notes (Signed)
Pre visit review using our clinic review tool, if applicable. No additional management support is needed unless otherwise documented below in the visit note. 

## 2013-12-02 ENCOUNTER — Telehealth: Payer: Self-pay | Admitting: Family Medicine

## 2013-12-02 NOTE — Telephone Encounter (Signed)
Relevant patient education assigned to patient using Emmi. ° °

## 2013-12-05 ENCOUNTER — Other Ambulatory Visit: Payer: Commercial Managed Care - HMO

## 2013-12-07 ENCOUNTER — Other Ambulatory Visit: Payer: Self-pay | Admitting: Cardiovascular Disease

## 2013-12-12 ENCOUNTER — Other Ambulatory Visit (INDEPENDENT_AMBULATORY_CARE_PROVIDER_SITE_OTHER): Payer: Medicare PPO

## 2013-12-12 DIAGNOSIS — I10 Essential (primary) hypertension: Secondary | ICD-10-CM

## 2013-12-12 DIAGNOSIS — R944 Abnormal results of kidney function studies: Secondary | ICD-10-CM

## 2013-12-12 LAB — BASIC METABOLIC PANEL
BUN: 40 mg/dL — ABNORMAL HIGH (ref 6–23)
CO2: 27 mEq/L (ref 19–32)
Calcium: 8.8 mg/dL (ref 8.4–10.5)
Chloride: 100 mEq/L (ref 96–112)
Creatinine, Ser: 2.3 mg/dL — ABNORMAL HIGH (ref 0.4–1.5)
GFR: 29.33 mL/min — AB (ref >60.00)
GLUCOSE: 117 mg/dL — AB (ref 70–99)
POTASSIUM: 4.4 meq/L (ref 3.5–5.1)
SODIUM: 133 meq/L — AB (ref 135–145)

## 2013-12-14 NOTE — Addendum Note (Signed)
Addended by: Agnes Lawrence on: 12/14/2013 10:34 AM   Modules accepted: Orders

## 2013-12-18 ENCOUNTER — Telehealth: Payer: Self-pay | Admitting: *Deleted

## 2013-12-18 NOTE — Telephone Encounter (Signed)
Patient left a message wanting to know when an appt would be made to see the kidney specialist.  I called the pt and advised him per Neoma Laming the referral coordinator the notes will be faxed to [facility] and they will let her know about an appt and she will call him.  I also advised him it can usually take 3-4 months to be seen there and he agreed.

## 2014-03-01 ENCOUNTER — Telehealth: Payer: Self-pay

## 2014-03-01 NOTE — Telephone Encounter (Signed)
Received 12 pages from [facility], patient has not been scheduled for an appt yet, sent to GI. 03/01/14/ss.

## 2014-03-08 ENCOUNTER — Other Ambulatory Visit: Payer: Self-pay | Admitting: Cardiovascular Disease

## 2014-03-23 ENCOUNTER — Encounter: Payer: Self-pay | Admitting: Gastroenterology

## 2014-03-23 ENCOUNTER — Telehealth: Payer: Self-pay | Admitting: Gastroenterology

## 2014-03-23 NOTE — Telephone Encounter (Signed)
I called patient and scheduled him for Direct Colon on 05-29-14

## 2014-03-23 NOTE — Telephone Encounter (Signed)
Colonoscopy [facility] Dr. Etta Quill, 01/2011, done for "high risk colon cancer screening, personal history of adenomatous polyps."  Findings 3 polyps were removed (81mm, 1mm, 12mm), all were adenomatous on pathology; also severe diverticulosis in sigmoid. He was recommended to discuss elective sigmoid resection with surgery, he was recommended to have repeat colonoscopy in 3 years (on letter written to him).   I agree with previous 3 year recommended follow up. He needs colonoscopy around now for polyp surveillance.  [facility].  Thanks

## 2014-03-29 ENCOUNTER — Encounter: Payer: Self-pay | Admitting: Family Medicine

## 2014-04-12 ENCOUNTER — Emergency Department (HOSPITAL_COMMUNITY): Payer: Medicare PPO

## 2014-04-12 ENCOUNTER — Inpatient Hospital Stay (HOSPITAL_COMMUNITY)
Admission: EM | Admit: 2014-04-12 | Discharge: 2014-04-20 | DRG: 699 | Disposition: A | Discharge status: Home Health | Payer: Medicare PPO | Attending: Internal Medicine | Admitting: Internal Medicine

## 2014-04-12 ENCOUNTER — Encounter (HOSPITAL_COMMUNITY): Payer: Self-pay | Admitting: Emergency Medicine

## 2014-04-12 DIAGNOSIS — D72828 Other elevated white blood cell count: Secondary | ICD-10-CM | POA: Diagnosis present

## 2014-04-12 DIAGNOSIS — Z955 Presence of coronary angioplasty implant and graft: Secondary | ICD-10-CM

## 2014-04-12 DIAGNOSIS — E876 Hypokalemia: Secondary | ICD-10-CM | POA: Diagnosis not present

## 2014-04-12 DIAGNOSIS — N183 Chronic kidney disease, stage 3 unspecified: Secondary | ICD-10-CM

## 2014-04-12 DIAGNOSIS — R059 Cough, unspecified: Secondary | ICD-10-CM

## 2014-04-12 DIAGNOSIS — J9811 Atelectasis: Secondary | ICD-10-CM | POA: Diagnosis present

## 2014-04-12 DIAGNOSIS — N281 Cyst of kidney, acquired: Principal | ICD-10-CM | POA: Diagnosis present

## 2014-04-12 DIAGNOSIS — E669 Obesity, unspecified: Secondary | ICD-10-CM | POA: Diagnosis present

## 2014-04-12 DIAGNOSIS — R109 Unspecified abdominal pain: Secondary | ICD-10-CM | POA: Diagnosis not present

## 2014-04-12 DIAGNOSIS — N320 Bladder-neck obstruction: Secondary | ICD-10-CM | POA: Diagnosis present

## 2014-04-12 DIAGNOSIS — Z7982 Long term (current) use of aspirin: Secondary | ICD-10-CM

## 2014-04-12 DIAGNOSIS — O26899 Other specified pregnancy related conditions, unspecified trimester: Secondary | ICD-10-CM

## 2014-04-12 DIAGNOSIS — E785 Hyperlipidemia, unspecified: Secondary | ICD-10-CM | POA: Diagnosis present

## 2014-04-12 DIAGNOSIS — E875 Hyperkalemia: Secondary | ICD-10-CM | POA: Diagnosis present

## 2014-04-12 DIAGNOSIS — E871 Hypo-osmolality and hyponatremia: Secondary | ICD-10-CM | POA: Diagnosis present

## 2014-04-12 DIAGNOSIS — K5900 Constipation, unspecified: Secondary | ICD-10-CM | POA: Diagnosis not present

## 2014-04-12 DIAGNOSIS — J449 Chronic obstructive pulmonary disease, unspecified: Secondary | ICD-10-CM

## 2014-04-12 DIAGNOSIS — I2510 Atherosclerotic heart disease of native coronary artery without angina pectoris: Secondary | ICD-10-CM | POA: Diagnosis present

## 2014-04-12 DIAGNOSIS — R1030 Lower abdominal pain, unspecified: Secondary | ICD-10-CM

## 2014-04-12 DIAGNOSIS — N184 Chronic kidney disease, stage 4 (severe): Secondary | ICD-10-CM | POA: Diagnosis present

## 2014-04-12 DIAGNOSIS — R0602 Shortness of breath: Secondary | ICD-10-CM

## 2014-04-12 DIAGNOSIS — E872 Acidosis: Secondary | ICD-10-CM | POA: Diagnosis present

## 2014-04-12 DIAGNOSIS — Z6834 Body mass index (BMI) 34.0-34.9, adult: Secondary | ICD-10-CM

## 2014-04-12 DIAGNOSIS — Z7952 Long term (current) use of systemic steroids: Secondary | ICD-10-CM

## 2014-04-12 DIAGNOSIS — I25810 Atherosclerosis of coronary artery bypass graft(s) without angina pectoris: Secondary | ICD-10-CM | POA: Diagnosis present

## 2014-04-12 DIAGNOSIS — I10 Essential (primary) hypertension: Secondary | ICD-10-CM | POA: Diagnosis present

## 2014-04-12 DIAGNOSIS — Z8673 Personal history of transient ischemic attack (TIA), and cerebral infarction without residual deficits: Secondary | ICD-10-CM

## 2014-04-12 DIAGNOSIS — J441 Chronic obstructive pulmonary disease with (acute) exacerbation: Secondary | ICD-10-CM | POA: Diagnosis not present

## 2014-04-12 DIAGNOSIS — Z87891 Personal history of nicotine dependence: Secondary | ICD-10-CM

## 2014-04-12 DIAGNOSIS — N179 Acute kidney failure, unspecified: Secondary | ICD-10-CM | POA: Diagnosis present

## 2014-04-12 DIAGNOSIS — J9610 Chronic respiratory failure, unspecified whether with hypoxia or hypercapnia: Secondary | ICD-10-CM | POA: Diagnosis present

## 2014-04-12 DIAGNOSIS — I129 Hypertensive chronic kidney disease with stage 1 through stage 4 chronic kidney disease, or unspecified chronic kidney disease: Secondary | ICD-10-CM | POA: Diagnosis present

## 2014-04-12 DIAGNOSIS — R05 Cough: Secondary | ICD-10-CM

## 2014-04-12 DIAGNOSIS — R1012 Left upper quadrant pain: Secondary | ICD-10-CM

## 2014-04-12 LAB — CBC WITH DIFFERENTIAL/PLATELET
BASOS ABS: 0 10*3/uL (ref 0.0–0.1)
BASOS PCT: 0 % (ref 0–1)
EOS ABS: 0.2 10*3/uL (ref 0.0–0.7)
Eosinophils Relative: 1 % (ref 0–5)
HCT: 31.3 % — ABNORMAL LOW (ref 39.0–52.0)
HEMOGLOBIN: 10.9 g/dL — AB (ref 13.0–17.0)
Lymphocytes Relative: 9 % — ABNORMAL LOW (ref 12–46)
Lymphs Abs: 1.1 10*3/uL (ref 0.7–4.0)
MCH: 30.1 pg (ref 26.0–34.0)
MCHC: 34.8 g/dL (ref 30.0–36.0)
MCV: 86.5 fL (ref 78.0–100.0)
MONOS PCT: 6 % (ref 3–12)
Monocytes Absolute: 0.8 10*3/uL (ref 0.1–1.0)
NEUTROS ABS: 10.7 10*3/uL — AB (ref 1.7–7.7)
Neutrophils Relative %: 84 % — ABNORMAL HIGH (ref 43–77)
Platelets: 233 10*3/uL (ref 150–400)
RBC: 3.62 MIL/uL — ABNORMAL LOW (ref 4.22–5.81)
RDW: 12.9 % (ref 11.5–15.5)
WBC: 12.8 10*3/uL — ABNORMAL HIGH (ref 4.0–10.5)

## 2014-04-12 LAB — COMPREHENSIVE METABOLIC PANEL
ALBUMIN: 4 g/dL (ref 3.5–5.2)
ALK PHOS: 58 U/L (ref 39–117)
ALT: 15 U/L (ref 0–53)
AST: 20 U/L (ref 0–37)
Anion gap: 13 (ref 5–15)
BUN: 49 mg/dL — AB (ref 6–23)
CO2: 22 mEq/L (ref 19–32)
Calcium: 9.2 mg/dL (ref 8.4–10.5)
Chloride: 97 mEq/L (ref 96–112)
Creatinine, Ser: 2.63 mg/dL — ABNORMAL HIGH (ref 0.50–1.35)
GFR calc Af Amer: 26 mL/min — ABNORMAL LOW (ref >90)
GFR calc non Af Amer: 22 mL/min — ABNORMAL LOW (ref >90)
Glucose, Bld: 121 mg/dL — ABNORMAL HIGH (ref 70–99)
POTASSIUM: 5.5 meq/L — AB (ref 3.7–5.3)
Sodium: 132 mEq/L — ABNORMAL LOW (ref 137–147)
TOTAL PROTEIN: 7.3 g/dL (ref 6.0–8.3)
Total Bilirubin: 0.2 mg/dL — ABNORMAL LOW (ref 0.3–1.2)

## 2014-04-12 LAB — URINALYSIS, ROUTINE W REFLEX MICROSCOPIC
Bilirubin Urine: NEGATIVE
GLUCOSE, UA: NEGATIVE mg/dL
Ketones, ur: NEGATIVE mg/dL
LEUKOCYTES UA: NEGATIVE
Nitrite: NEGATIVE
PH: 7 (ref 5.0–8.0)
Protein, ur: 100 mg/dL — AB
SPECIFIC GRAVITY, URINE: 1.014 (ref 1.005–1.030)
Urobilinogen, UA: 0.2 mg/dL (ref 0.0–1.0)

## 2014-04-12 LAB — URINE MICROSCOPIC-ADD ON

## 2014-04-12 MED ORDER — MORPHINE SULFATE 4 MG/ML IJ SOLN
4.0000 mg | Freq: Once | INTRAMUSCULAR | Status: AC
  Administered 2014-04-12: 4 mg via INTRAVENOUS
  Filled 2014-04-12: qty 1

## 2014-04-12 MED ORDER — MORPHINE SULFATE 4 MG/ML IJ SOLN
4.0000 mg | Freq: Once | INTRAMUSCULAR | Status: DC
  Filled 2014-04-12: qty 1

## 2014-04-12 MED ORDER — OXYCODONE-ACETAMINOPHEN 5-325 MG PO TABS: 1.0000 | ORAL_TABLET | ORAL | Status: DC | PRN

## 2014-04-12 MED ORDER — IOHEXOL 300 MG/ML  SOLN
25.0000 mL | Freq: Once | INTRAMUSCULAR | Status: AC | PRN
  Administered 2014-04-12: 25 mL via ORAL

## 2014-04-12 MED ORDER — ONDANSETRON HCL 4 MG/2ML IJ SOLN
4.0000 mg | Freq: Once | INTRAMUSCULAR | Status: AC
  Administered 2014-04-12: 4 mg via INTRAVENOUS
  Filled 2014-04-12: qty 2

## 2014-04-12 MED ORDER — MORPHINE SULFATE 4 MG/ML IJ SOLN
8.0000 mg | Freq: Once | INTRAMUSCULAR | Status: AC
  Administered 2014-04-13: 8 mg via INTRAMUSCULAR
  Filled 2014-04-12: qty 2

## 2014-04-12 MED ORDER — OXYCODONE-ACETAMINOPHEN 5-325 MG PO TABS
1.0000 | ORAL_TABLET | Freq: Once | ORAL | Status: AC
  Administered 2014-04-13: 1 via ORAL
  Filled 2014-04-12: qty 1

## 2014-04-12 MED ORDER — SODIUM CHLORIDE 0.9 % IV BOLUS (SEPSIS)
1000.0000 mL | Freq: Once | INTRAVENOUS | Status: AC
  Administered 2014-04-12: 1000 mL via INTRAVENOUS

## 2014-04-12 MED ORDER — OXYCODONE-ACETAMINOPHEN 5-325 MG PO TABS: 2.0000 | ORAL_TABLET | Freq: Once | ORAL | Status: DC

## 2014-04-12 NOTE — ED Provider Notes (Signed)
CSN: [id number]     Arrival date & time 04/12/14  1855 History   First MD Initiated Contact with Patient 04/12/14 2017     Chief Complaint  Patient presents with  . Back Pain  . Flank Pain     (Consider location/radiation/quality/duration/timing/severity/associated sxs/prior Treatment) HPI 79 year old male presents with about 6 hours of left-sided back pain and now left-sided abdominal pain. States it started all of a sudden and has been a constant severe pain. No fluctuation or waxing and waning. No vomiting. He has felt a little queasy. No diarrhea or constipation. Has never had pain like this before. 3 years ago he had partial colectomy for a "twisted bowel". Pain is worse with inspiration.  Past Medical History  Diagnosis Date  . Stroke 2012  . High cholesterol   . Hypertension   . Kidney disease   . COPD (chronic obstructive pulmonary disease)   . CAD (coronary artery disease) [place], [name]  . Acute and chronic respiratory failure 10/22/2012  . Osteoarthritis 10/22/2012  . Migraine    Past Surgical History  Procedure Laterality Date  . Coronary stent placement    . Appendectomy  1984  . Colon surgery  2012    partial colon removed - twisted bowel   Family History  Problem Relation Age of Onset  . Cancer Mother   . Heart disease Father   . Heart attack Father   . Cancer Sister   . Parkinson's disease Brother    History  Substance Use Topics  . Smoking status: Former Smoker -- 1.00 packs/day for 49 years    Types: Cigarettes, Pipe, Cigars    Quit date: 02/21/2000  . Smokeless tobacco: Never Used  . Alcohol Use: No    Review of Systems  Constitutional: Negative for fever.  Respiratory: Negative for shortness of breath.   Cardiovascular: Negative for chest pain.  Gastrointestinal: Positive for nausea and abdominal pain.  Genitourinary: Positive for flank pain. Negative for dysuria and hematuria.  Musculoskeletal: Positive for back pain.  Neurological:  Negative for weakness and numbness.  All other systems reviewed and are negative.     Allergies  Other  Home Medications   Prior to Admission medications   Medication Sig Start Date End Date Taking? Authorizing Provider  albuterol (PROVENTIL HFA;VENTOLIN HFA) 108 (90 BASE) MCG/ACT inhaler Inhale 2 puffs into the lungs every 6 (six) hours as needed for wheezing or shortness of breath. 12/01/13   Eulas Post, MD  amLODipine (NORVASC) 10 MG tablet Take 1 tablet (10 mg total) by mouth every morning. 08/18/13   Burnell Blanks, MD  aspirin 325 MG EC tablet Take 162 mg by mouth daily. Patients states that he only takes occasionally (08/18/13)    Historical Provider, MD  atorvastatin (LIPITOR) 10 MG tablet TAKE ONE TABLET BY MOUTH ONCE DAILY 10/26/13   Burnell Blanks, MD  cyclobenzaprine (FLEXERIL) 5 MG tablet Take 1 tablet (5 mg total) by mouth 3 (three) times daily as needed for muscle spasms. 11/02/13   [name], DO  doxazosin (CARDURA) 4 MG tablet TAKE ONE TABLET BY MOUTH ONCE DAILY 03/08/14   Burnell Blanks, MD  fluticasone-salmeterol (ADVAIR HFA) [phone] MCG/ACT inhaler Inhale 2 puffs into the lungs 2 (two) times daily. 08/15/13   Tanda Rockers, MD  furosemide (LASIX) 20 MG tablet TAKE ONE TABLET BY MOUTH ONCE DAILY 12/07/13   Burnell Blanks, MD  losartan (COZAAR) 100 MG tablet TAKE ONE TABLET  BY MOUTH ONCE DAILY 12/07/13   Burnell Blanks, MD  metoprolol (LOPRESSOR) 100 MG tablet Take 1 tablet (100 mg total) by mouth daily. 06/06/13   Burnell Blanks, MD  nystatin (MYCOSTATIN) 100000 UNIT/ML suspension Take 5 mLs (500,000 Units total) by mouth 4 (four) times daily. 12/01/13   Eulas Post, MD  potassium chloride SA (K-DUR,KLOR-CON) 20 MEQ tablet Take 1 tablet (20 mEq total) by mouth daily. 06/12/13   Burnell Blanks, MD  tiotropium (SPIRIVA) 18 MCG inhalation capsule Place 1 capsule (18 mcg total) into inhaler and inhale daily. 08/15/13    Tanda Rockers, MD   BP 151/54  Pulse 63  Temp(Src) 99 F (37.2 C) (Oral)  Resp 18  Ht 5\' 8"  (1.727 m)  Wt 195 lb (88.451 kg)  BMI 29.66 kg/m2  SpO2 95% Physical Exam  Nursing note and vitals reviewed. Constitutional: He is oriented to person, place, and time. He appears well-developed and well-nourished.  HENT:  Head: Normocephalic and atraumatic.  Right Ear: External ear normal.  Left Ear: External ear normal.  Nose: Nose normal.  Eyes: Right eye exhibits no discharge. Left eye exhibits no discharge.  Neck: Neck supple.  Cardiovascular: Normal rate, regular rhythm, normal heart sounds and intact distal pulses.   Pulmonary/Chest: Effort normal.  Abdominal: Soft. There is tenderness in the periumbilical area and left lower quadrant. There is CVA tenderness (left sided).  Musculoskeletal: He exhibits no edema.  Neurological: He is alert and oriented to person, place, and time.  Skin: Skin is warm and dry.    ED Course  Procedures (including critical care time) Labs Review Labs Reviewed  CBC WITH DIFFERENTIAL - Abnormal; Notable for the following:    WBC 12.8 (*)    RBC 3.62 (*)    Hemoglobin 10.9 (*)    HCT 31.3 (*)    Neutrophils Relative % 84 (*)    Neutro Abs 10.7 (*)    Lymphocytes Relative 9 (*)    All other components within normal limits  COMPREHENSIVE METABOLIC PANEL - Abnormal; Notable for the following:    Sodium 132 (*)    Potassium 5.5 (*)    Glucose, Bld 121 (*)    BUN 49 (*)    Creatinine, Ser 2.63 (*)    Total Bilirubin 0.2 (*)    GFR calc non Af Amer 22 (*)    GFR calc Af Amer 26 (*)    All other components within normal limits  URINALYSIS, ROUTINE W REFLEX MICROSCOPIC - Abnormal; Notable for the following:    APPearance HAZY (*)    Hgb urine dipstick LARGE (*)    Protein, ur 100 (*)    All other components within normal limits  URINE MICROSCOPIC-ADD ON    Imaging Review Ct Abdomen Pelvis Wo Contrast  04/12/2014   CLINICAL DATA:  Left  flank pain all day, no history of kidney stones, stage III renal failure  EXAM: CT ABDOMEN AND PELVIS WITHOUT CONTRAST  TECHNIQUE: Multidetector CT imaging of the abdomen and pelvis was performed following the standard protocol without IV contrast.  COMPARISON:  None.  FINDINGS: Lung bases are unremarkable. The study is limited without IV contrast. Unenhanced liver shows no biliary ductal dilatation.  No calcified gallstones are noted within gallbladder. Unenhanced pancreas spleen and adrenal glands are unremarkable. Extensive atherosclerotic calcifications of abdominal aorta and iliac arteries. No aortic aneurysm.  Unenhanced kidneys shows a lobulated contour. Multiple exophytic lesions are noted bilateral kidneys. This may represent renal  cyst but cannot be characterized without IV contrast. A probable cyst in lower pole of the right kidney measures 1.2 cm. Probable cyst in upper pole of the right kidney measures 1.4 cm. There are few high density exophytic lesions. This may represent hemorrhagic or proteinaceous cyst. The largest in upper pole of the right kidney measures 8 mm. The largest high density lesion in upper pole of the left kidney measures 8 mm.  There is a central parapelvic cyst in midpole of the left kidney with peripheral high-density material. This measures 3.4 x 2.8 cm. Some of the high density material is extending in central calyces of the lower pole of the left kidney as seen in axial image 61. Findings may represent hemorrhagic products within cyst or cystic neoplasm. Further correlation with enhanced study and/or urology exam is recommended.  No nephrolithiasis. No calcified ureteral calculi. Oral contrast material was given to the patient. No small bowel or colonic obstruction. Stool noted in right colon and transverse colon. There is no pericecal inflammation. Sigmoid colon diverticula are noted. No evidence of acute diverticulitis. Prostate gland and seminal vesicles are unremarkable. No  calcified calculi are noted within urinary bladder. There is a small left hydrocele.  IMPRESSION: 1. Limited study without IV contrast. There is a central parapelvic cyst in midpole of the left kidney with peripheral high-density material. This measures 3.4 x 2.8 cm. Some of the high density material is extending in central calyces of the lower pole of the left kidney as seen in axial image 61. Findings may represent hemorrhagic products within cyst or cystic neoplasm. Further correlation with enhanced study and/or urology exam is recommended. 2. No nephrolithiasis. No calcified ureteral calculi. No hydronephrosis or hydroureter. 3. Multiple exophytic lesions bilateral kidney cannot be characterized without IV contrast. Although this may represent cysts some of the lesions are high density. Hemorrhagic or proteinaceous cysts cannot be excluded. 4. No calcified calculi are noted within urinary bladder. 5. Sigmoid colon diverticula are noted. No evidence of acute diverticulitis. 6. Small left hydrocele.   Electronically Signed   By: Lahoma Crocker M.D.   On: 04/12/2014 22:25   Dg Chest Portable 1 View  04/12/2014   CLINICAL DATA:  Left flank pain all day. Kidney problems. Abdominal pain.  EXAM: PORTABLE CHEST - 1 VIEW  COMPARISON:  11/29/2012  FINDINGS: The heart size and mediastinal contours are within normal limits. Both lungs are clear. The visualized skeletal structures are unremarkable.  IMPRESSION: No active disease.   Electronically Signed   By: Lucienne Capers M.D.   On: 04/12/2014 21:08     EKG Interpretation None      MDM   Final diagnoses:  Abdominal pain  Left flank pain  Cyst of left kidney    Patient with acute left flank and abdominal pain. No obvious stone, colitis, or diverticulitis. No perforation. Pain is constant, and given CT could be hemorrhagic renal cyst. D/w Dr. Karsten Ro, who recommends pain control and f/u in his office for other urologic imaging to evaluate cyst. Patient's  pain is poorly controlled in ED despite multiple IV and IM narcotics. Requesting pain control in hospital. Will admit to hospitalist.    Ephraim Hamburger, MD 04/13/14 [phone]

## 2014-04-12 NOTE — ED Notes (Addendum)
Pt completed CT contrast; CT called

## 2014-04-12 NOTE — ED Notes (Addendum)
Lt. Flank pain all day. No hx. Of kidney stones. Is being treated for kidney problems.

## 2014-04-12 NOTE — ED Notes (Signed)
Pt is in stage 3 renal failure (sees Dr. [facility]);

## 2014-04-13 ENCOUNTER — Observation Stay (HOSPITAL_COMMUNITY): Payer: Medicare PPO

## 2014-04-13 DIAGNOSIS — Z6834 Body mass index (BMI) 34.0-34.9, adult: Secondary | ICD-10-CM | POA: Diagnosis not present

## 2014-04-13 DIAGNOSIS — E872 Acidosis: Secondary | ICD-10-CM | POA: Diagnosis present

## 2014-04-13 DIAGNOSIS — I129 Hypertensive chronic kidney disease with stage 1 through stage 4 chronic kidney disease, or unspecified chronic kidney disease: Secondary | ICD-10-CM | POA: Diagnosis present

## 2014-04-13 DIAGNOSIS — E669 Obesity, unspecified: Secondary | ICD-10-CM | POA: Diagnosis present

## 2014-04-13 DIAGNOSIS — J9811 Atelectasis: Secondary | ICD-10-CM | POA: Diagnosis present

## 2014-04-13 DIAGNOSIS — Z8673 Personal history of transient ischemic attack (TIA), and cerebral infarction without residual deficits: Secondary | ICD-10-CM | POA: Diagnosis not present

## 2014-04-13 DIAGNOSIS — E875 Hyperkalemia: Secondary | ICD-10-CM | POA: Diagnosis present

## 2014-04-13 DIAGNOSIS — J441 Chronic obstructive pulmonary disease with (acute) exacerbation: Secondary | ICD-10-CM | POA: Diagnosis not present

## 2014-04-13 DIAGNOSIS — Z955 Presence of coronary angioplasty implant and graft: Secondary | ICD-10-CM | POA: Diagnosis not present

## 2014-04-13 DIAGNOSIS — N179 Acute kidney failure, unspecified: Secondary | ICD-10-CM

## 2014-04-13 DIAGNOSIS — N184 Chronic kidney disease, stage 4 (severe): Secondary | ICD-10-CM | POA: Diagnosis present

## 2014-04-13 DIAGNOSIS — J449 Chronic obstructive pulmonary disease, unspecified: Secondary | ICD-10-CM

## 2014-04-13 DIAGNOSIS — I10 Essential (primary) hypertension: Secondary | ICD-10-CM

## 2014-04-13 DIAGNOSIS — Z7952 Long term (current) use of systemic steroids: Secondary | ICD-10-CM | POA: Diagnosis not present

## 2014-04-13 DIAGNOSIS — E876 Hypokalemia: Secondary | ICD-10-CM | POA: Diagnosis not present

## 2014-04-13 DIAGNOSIS — N183 Chronic kidney disease, stage 3 (moderate): Secondary | ICD-10-CM

## 2014-04-13 DIAGNOSIS — I2510 Atherosclerotic heart disease of native coronary artery without angina pectoris: Secondary | ICD-10-CM | POA: Diagnosis present

## 2014-04-13 DIAGNOSIS — N320 Bladder-neck obstruction: Secondary | ICD-10-CM | POA: Diagnosis present

## 2014-04-13 DIAGNOSIS — R109 Unspecified abdominal pain: Secondary | ICD-10-CM | POA: Diagnosis present

## 2014-04-13 DIAGNOSIS — D72828 Other elevated white blood cell count: Secondary | ICD-10-CM | POA: Diagnosis present

## 2014-04-13 DIAGNOSIS — E785 Hyperlipidemia, unspecified: Secondary | ICD-10-CM | POA: Diagnosis present

## 2014-04-13 DIAGNOSIS — E871 Hypo-osmolality and hyponatremia: Secondary | ICD-10-CM | POA: Diagnosis present

## 2014-04-13 DIAGNOSIS — Z7982 Long term (current) use of aspirin: Secondary | ICD-10-CM | POA: Diagnosis not present

## 2014-04-13 DIAGNOSIS — J9610 Chronic respiratory failure, unspecified whether with hypoxia or hypercapnia: Secondary | ICD-10-CM | POA: Diagnosis present

## 2014-04-13 DIAGNOSIS — N281 Cyst of kidney, acquired: Secondary | ICD-10-CM | POA: Diagnosis present

## 2014-04-13 DIAGNOSIS — Q6100 Congenital renal cyst, unspecified: Secondary | ICD-10-CM

## 2014-04-13 DIAGNOSIS — Z87891 Personal history of nicotine dependence: Secondary | ICD-10-CM | POA: Diagnosis not present

## 2014-04-13 DIAGNOSIS — K5900 Constipation, unspecified: Secondary | ICD-10-CM | POA: Diagnosis not present

## 2014-04-13 LAB — POTASSIUM: Potassium: 5.4 mEq/L — ABNORMAL HIGH (ref 3.7–5.3)

## 2014-04-13 LAB — BASIC METABOLIC PANEL
Anion gap: 12 (ref 5–15)
BUN: 49 mg/dL — ABNORMAL HIGH (ref 6–23)
CHLORIDE: 100 meq/L (ref 96–112)
CO2: 22 mEq/L (ref 19–32)
CREATININE: 2.93 mg/dL — AB (ref 0.50–1.35)
Calcium: 8.3 mg/dL — ABNORMAL LOW (ref 8.4–10.5)
GFR calc non Af Amer: 19 mL/min — ABNORMAL LOW (ref >90)
GFR, EST AFRICAN AMERICAN: 22 mL/min — AB (ref >90)
Glucose, Bld: 124 mg/dL — ABNORMAL HIGH (ref 70–99)
Potassium: 6.2 mEq/L — ABNORMAL HIGH (ref 3.7–5.3)
SODIUM: 134 meq/L — AB (ref 137–147)

## 2014-04-13 LAB — APTT: APTT: 30 s (ref 24–37)

## 2014-04-13 LAB — CBC
HCT: 29.8 % — ABNORMAL LOW (ref 39.0–52.0)
Hemoglobin: 10.2 g/dL — ABNORMAL LOW (ref 13.0–17.0)
MCH: 30.1 pg (ref 26.0–34.0)
MCHC: 34.2 g/dL (ref 30.0–36.0)
MCV: 87.9 fL (ref 78.0–100.0)
Platelets: 241 10*3/uL (ref 150–400)
RBC: 3.39 MIL/uL — ABNORMAL LOW (ref 4.22–5.81)
RDW: 13.2 % (ref 11.5–15.5)
WBC: 13.6 10*3/uL — AB (ref 4.0–10.5)

## 2014-04-13 LAB — CREATININE, URINE, RANDOM: CREATININE, URINE: 180.16 mg/dL

## 2014-04-13 LAB — OSMOLALITY: OSMOLALITY: 299 mosm/kg (ref 275–300)

## 2014-04-13 LAB — PROTIME-INR
INR: 1.05 (ref 0.00–1.49)
PROTHROMBIN TIME: 13.8 s (ref 11.6–15.2)

## 2014-04-13 LAB — SODIUM, URINE, RANDOM: Sodium, Ur: 21 mEq/L

## 2014-04-13 LAB — PRO B NATRIURETIC PEPTIDE: Pro B Natriuretic peptide (BNP): 420.6 pg/mL (ref 0–450)

## 2014-04-13 MED ORDER — METOPROLOL TARTRATE 100 MG PO TABS
100.0000 mg | ORAL_TABLET | Freq: Every day | ORAL | Status: DC
  Administered 2014-04-13 – 2014-04-17 (×5): 100 mg via ORAL
  Filled 2014-04-13 (×6): qty 1

## 2014-04-13 MED ORDER — TIOTROPIUM BROMIDE MONOHYDRATE 18 MCG IN CAPS
18.0000 ug | ORAL_CAPSULE | Freq: Every day | RESPIRATORY_TRACT | Status: DC
  Administered 2014-04-13 – 2014-04-20 (×6): 18 ug via RESPIRATORY_TRACT
  Filled 2014-04-13 (×2): qty 5

## 2014-04-13 MED ORDER — ATORVASTATIN CALCIUM 10 MG PO TABS
10.0000 mg | ORAL_TABLET | Freq: Every day | ORAL | Status: DC
  Administered 2014-04-14 – 2014-04-19 (×6): 10 mg via ORAL
  Filled 2014-04-13 (×9): qty 1

## 2014-04-13 MED ORDER — MORPHINE SULFATE 2 MG/ML IJ SOLN
2.0000 mg | INTRAMUSCULAR | Status: DC | PRN
  Administered 2014-04-14: 2 mg via INTRAVENOUS
  Filled 2014-04-13: qty 1

## 2014-04-13 MED ORDER — SODIUM CHLORIDE 0.9 % IJ SOLN: 3.0000 mL | Freq: Two times a day (BID) | INTRAMUSCULAR | Status: DC

## 2014-04-13 MED ORDER — AMLODIPINE BESYLATE 10 MG PO TABS
10.0000 mg | ORAL_TABLET | Freq: Every morning | ORAL | Status: DC
  Administered 2014-04-13 – 2014-04-17 (×5): 10 mg via ORAL
  Filled 2014-04-13 (×4): qty 1
  Filled 2014-04-13: qty 2
  Filled 2014-04-13: qty 1

## 2014-04-13 MED ORDER — DOXAZOSIN MESYLATE 4 MG PO TABS
4.0000 mg | ORAL_TABLET | Freq: Every day | ORAL | Status: DC
  Administered 2014-04-13 – 2014-04-20 (×8): 4 mg via ORAL
  Filled 2014-04-13 (×8): qty 1

## 2014-04-13 MED ORDER — MOMETASONE FURO-FORMOTEROL FUM 100-5 MCG/ACT IN AERO
2.0000 | INHALATION_SPRAY | Freq: Two times a day (BID) | RESPIRATORY_TRACT | Status: DC
  Administered 2014-04-13 – 2014-04-20 (×14): 2 via RESPIRATORY_TRACT
  Filled 2014-04-13 (×2): qty 8.8

## 2014-04-13 MED ORDER — SODIUM CHLORIDE 0.9 % IV SOLN
INTRAVENOUS | Status: AC
  Administered 2014-04-13: 08:00:00 via INTRAVENOUS

## 2014-04-13 MED ORDER — ONDANSETRON HCL 4 MG PO TABS: 4.0000 mg | ORAL_TABLET | Freq: Four times a day (QID) | ORAL | Status: DC | PRN

## 2014-04-13 MED ORDER — SODIUM POLYSTYRENE SULFONATE 15 GM/60ML PO SUSP
30.0000 g | Freq: Once | ORAL | Status: AC
  Administered 2014-04-13: 30 g via ORAL
  Filled 2014-04-13 (×2): qty 120

## 2014-04-13 MED ORDER — ASPIRIN EC 81 MG PO TBEC
81.0000 mg | DELAYED_RELEASE_TABLET | Freq: Every day | ORAL | Status: DC
  Administered 2014-04-13: 81 mg via ORAL
  Filled 2014-04-13: qty 1

## 2014-04-13 MED ORDER — NALOXONE HCL 0.4 MG/ML IJ SOLN
0.4000 mg | INTRAMUSCULAR | Status: DC | PRN
  Filled 2014-04-13: qty 1

## 2014-04-13 MED ORDER — FUROSEMIDE 10 MG/ML IJ SOLN
20.0000 mg | Freq: Once | INTRAMUSCULAR | Status: AC
  Administered 2014-04-13: 20 mg via INTRAVENOUS

## 2014-04-13 MED ORDER — MORPHINE SULFATE ER 15 MG PO TBCR
15.0000 mg | EXTENDED_RELEASE_TABLET | Freq: Two times a day (BID) | ORAL | Status: DC
  Administered 2014-04-13 – 2014-04-15 (×5): 15 mg via ORAL
  Filled 2014-04-13 (×5): qty 1

## 2014-04-13 MED ORDER — SODIUM CHLORIDE 0.9 % IV SOLN: 250.0000 mL | INTRAVENOUS | Status: DC | PRN

## 2014-04-13 MED ORDER — ALBUTEROL SULFATE (2.5 MG/3ML) 0.083% IN NEBU
3.0000 mL | INHALATION_SOLUTION | Freq: Four times a day (QID) | RESPIRATORY_TRACT | Status: DC | PRN
  Administered 2014-04-15 – 2014-04-16 (×5): 3 mL via RESPIRATORY_TRACT
  Filled 2014-04-13 (×8): qty 3

## 2014-04-13 MED ORDER — SODIUM CHLORIDE 0.9 % IJ SOLN: 3.0000 mL | INTRAMUSCULAR | Status: DC | PRN

## 2014-04-13 MED ORDER — FUROSEMIDE 10 MG/ML IJ SOLN
INTRAMUSCULAR | Status: AC
  Administered 2014-04-13: 20 mg via INTRAVENOUS
  Filled 2014-04-13: qty 4

## 2014-04-13 MED ORDER — ONDANSETRON HCL 4 MG/2ML IJ SOLN: 4.0000 mg | Freq: Four times a day (QID) | INTRAMUSCULAR | Status: DC | PRN

## 2014-04-13 NOTE — Consult Note (Signed)
Urology Consult   Physician requesting consult: Dr. Lala Lund  Reason for consult: Left hemorrhagic renal cyst  History of Present Illness: Terry [name] is a 79 y.o. who developed the acute onset of left flank pain upon waking up yesterday morning.  This pain gradually worsened through the day yesterday becoming noticeably more severe as the day progressed causing him to present to the hospital. He denied fever, nausea, vomiting, hematuria, or other symptoms.  He has a history of chronic kidney disease followed by [facility] and has a baseline Cr of 2.1 to 2.3.  On admission, his Cr was elevated up to 2.6. His UA demonstrated 21-50 RBCs without evidence for infection.  He had a CT scan of the abdomen and pelvis without contrast which demonstrated no evidence of urolithiasis but did demonstrate multiple renal cysts including what appeared to be a 3.7 cm left parapelvic renal cyst with hyperdense areas likely consistent with acute hemorrhage. No evidence of hydronephrosis was noted to suggest obstruction.  He has no prior history of hematuria.  He did smoke 1 PPD for 49 years before quitting 14 years ago. He has no family history of GU malignancy. He is on aspirin 81 mg due to a history of cardiac stent placement and stroke.  Past Medical History  Diagnosis Date  . Stroke 2012  . High cholesterol   . Hypertension   . Kidney disease   . COPD (chronic obstructive pulmonary disease)   . CAD (coronary artery disease) [place], [name]  . Acute and chronic respiratory failure 10/22/2012  . Osteoarthritis 10/22/2012  . Migraine     Past Surgical History  Procedure Laterality Date  . Coronary stent placement    . Appendectomy  1984  . Colon surgery  2012    partial colon removed - twisted bowel    Current Hospital Medications:  Home Meds:    Medication List    TAKE these medications       oxyCODONE-acetaminophen 5-325 MG per tablet  Commonly known as:   PERCOCET  Take 1-2 tablets by mouth every 4 (four) hours as needed for severe pain.      ASK your doctor about these medications       albuterol 108 (90 BASE) MCG/ACT inhaler  Commonly known as:  PROVENTIL HFA;VENTOLIN HFA  Inhale 2 puffs into the lungs every 6 (six) hours as needed for wheezing or shortness of breath.     amLODipine 10 MG tablet  Commonly known as:  NORVASC  Take 1 tablet (10 mg total) by mouth every morning.     aspirin EC 81 MG tablet  Take 81 mg by mouth daily.     atorvastatin 10 MG tablet  Commonly known as:  LIPITOR  TAKE ONE TABLET BY MOUTH ONCE DAILY     doxazosin 4 MG tablet  Commonly known as:  CARDURA  TAKE ONE TABLET BY MOUTH ONCE DAILY     fluticasone-salmeterol 115-21 MCG/ACT inhaler  Commonly known as:  ADVAIR HFA  Inhale 2 puffs into the lungs 2 (two) times daily.     furosemide 20 MG tablet  Commonly known as:  LASIX  TAKE ONE TABLET BY MOUTH ONCE DAILY     losartan 100 MG tablet  Commonly known as:  COZAAR  TAKE ONE TABLET BY MOUTH ONCE DAILY     metoprolol 100 MG tablet  Commonly known as:  LOPRESSOR  Take 1 tablet (100 mg total) by mouth daily.  tiotropium 18 MCG inhalation capsule  Commonly known as:  SPIRIVA  Place 1 capsule (18 mcg total) into inhaler and inhale daily.        Scheduled Meds: . amLODipine  10 mg Oral q morning - 10a  . atorvastatin  10 mg Oral q1800  . doxazosin  4 mg Oral Daily  . metoprolol  100 mg Oral Daily  . mometasone-formoterol  2 puff Inhalation BID  . morphine  15 mg Oral Q12H  . tiotropium  18 mcg Inhalation Daily   Continuous Infusions: . sodium chloride 100 mL/hr at 04/13/14 0732   PRN Meds:.albuterol, morphine injection, naLOXone (NARCAN)  injection, ondansetron (ZOFRAN) IV  Allergies:  Allergies  Allergen Reactions  . Other     Yellow Dye-- burns arms    Family History  Problem Relation Age of Onset  . Cancer Mother   . Heart disease Father   . Heart attack Father   .  Cancer Sister   . Parkinson's disease Brother     Social History:  reports that he quit smoking about 14 years ago. His smoking use included Cigarettes, Pipe, and Cigars. He has a 49 pack-year smoking history. He has never used smokeless tobacco. He reports that he does not drink alcohol or use illicit drugs.  ROS: A complete review of systems was performed.  All systems are negative except for pertinent findings as noted.  Physical Exam:  Vital signs in last 24 hours: Temp:  [98.1 F (36.7 C)-98.3 F (36.8 C)] 98.3 F (36.8 C) (10/23 1344) Pulse Rate:  [71-87] 73 (10/23 1344) Resp:  [15-22] 18 (10/23 1344) BP: (120-163)/(59-68) 142/59 mmHg (10/23 1344) SpO2:  [91 %-99 %] 93 % (10/23 1344) Weight:  [88.451 kg (195 lb)-93.1 kg (205 lb 4 oz)] 93.1 kg (205 lb 4 oz) (10/23 0435) Constitutional:  Alert and oriented, No acute distress Cardiovascular: Regular rate and rhythm, No JVD Respiratory: Normal respiratory effort, Lungs clear bilaterally GI: Abdomen is moderately distended with minimal bowel sounds. Minimal abdominal tenderness. GU: Moderate R CVAT. Lymphatic: No lymphadenopathy Neurologic: Grossly intact, no focal deficits Psychiatric: Normal mood and affect  Laboratory Data:   Recent Labs  04/12/14 1859 04/13/14 0421  WBC 12.8* 13.6*  HGB 10.9* 10.2*  HCT 31.3* 29.8*  PLT 233 241     Recent Labs  04/12/14 1859 04/13/14 0421 04/13/14 1344  NA 132* 134*  --   K 5.5* 6.2* 5.4*  CL 97 100  --   GLUCOSE 121* 124*  --   BUN 49* 49*  --   CALCIUM 9.2 8.3*  --   CREATININE 2.63* 2.93*  --      Results for orders placed during the hospital encounter of 04/12/14 (from the past 24 hour(s))  BASIC METABOLIC PANEL     Status: Abnormal   Collection Time    04/13/14  4:21 AM      Result Value Ref Range   Sodium 134 (*) 137 - 147 mEq/L   Potassium 6.2 (*) 3.7 - 5.3 mEq/L   Chloride 100  96 - 112 mEq/L   CO2 22  19 - 32 mEq/L   Glucose, Bld 124 (*) 70 - 99 mg/dL    BUN 49 (*) 6 - 23 mg/dL   Creatinine, Ser 2.93 (*) 0.50 - 1.35 mg/dL   Calcium 8.3 (*) 8.4 - 10.5 mg/dL   GFR calc non Af Amer 19 (*) >90 mL/min   GFR calc Af Amer 22 (*) >90 mL/min  Anion gap 12  5 - 15  CBC     Status: Abnormal   Collection Time    04/13/14  4:21 AM      Result Value Ref Range   WBC 13.6 (*) 4.0 - 10.5 K/uL   RBC 3.39 (*) 4.22 - 5.81 MIL/uL   Hemoglobin 10.2 (*) 13.0 - 17.0 g/dL   HCT 29.8 (*) 39.0 - 52.0 %   MCV 87.9  78.0 - 100.0 fL   MCH 30.1  26.0 - 34.0 pg   MCHC 34.2  30.0 - 36.0 g/dL   RDW 13.2  11.5 - 15.5 %   Platelets 241  150 - 400 K/uL  PROTIME-INR     Status: None   Collection Time    04/13/14  4:21 AM      Result Value Ref Range   Prothrombin Time 13.8  11.6 - 15.2 seconds   INR 1.05  0.00 - 1.49  APTT     Status: None   Collection Time    04/13/14  4:21 AM      Result Value Ref Range   aPTT 30  24 - 37 seconds  PRO B NATRIURETIC PEPTIDE     Status: None   Collection Time    04/13/14  4:21 AM      Result Value Ref Range   Pro B Natriuretic peptide (BNP) 420.6  0 - 450 pg/mL  OSMOLALITY     Status: None   Collection Time    04/13/14 11:50 AM      Result Value Ref Range   Osmolality 299  275 - 300 mOsm/kg  POTASSIUM     Status: Abnormal   Collection Time    04/13/14  1:44 PM      Result Value Ref Range   Potassium 5.4 (*) 3.7 - 5.3 mEq/L  CREATININE, URINE, RANDOM     Status: None   Collection Time    04/13/14  5:53 PM      Result Value Ref Range   Creatinine, Urine 180.16    SODIUM, URINE, RANDOM     Status: None   Collection Time    04/13/14  5:53 PM      Result Value Ref Range   Sodium, Ur 21     No results found for this or any previous visit (from the past 240 hour(s)).  Renal Function:  Recent Labs  04/12/14 1859 04/13/14 0421  CREATININE 2.63* 2.93*   Estimated Creatinine Clearance: 23.8 ml/min (by C-G formula based on Cr of 2.93).  Radiologic Imaging: Ct Abdomen Pelvis Wo Contrast  04/12/2014   CLINICAL  DATA:  Left flank pain all day, no history of kidney stones, stage III renal failure  EXAM: CT ABDOMEN AND PELVIS WITHOUT CONTRAST  TECHNIQUE: Multidetector CT imaging of the abdomen and pelvis was performed following the standard protocol without IV contrast.  COMPARISON:  None.  FINDINGS: Lung bases are unremarkable. The study is limited without IV contrast. Unenhanced liver shows no biliary ductal dilatation.  No calcified gallstones are noted within gallbladder. Unenhanced pancreas spleen and adrenal glands are unremarkable. Extensive atherosclerotic calcifications of abdominal aorta and iliac arteries. No aortic aneurysm.  Unenhanced kidneys shows a lobulated contour. Multiple exophytic lesions are noted bilateral kidneys. This may represent renal cyst but cannot be characterized without IV contrast. A probable cyst in lower pole of the right kidney measures 1.2 cm. Probable cyst in upper pole of the right kidney measures 1.4 cm. There are few high  density exophytic lesions. This may represent hemorrhagic or proteinaceous cyst. The largest in upper pole of the right kidney measures 8 mm. The largest high density lesion in upper pole of the left kidney measures 8 mm.  There is a central parapelvic cyst in midpole of the left kidney with peripheral high-density material. This measures 3.4 x 2.8 cm. Some of the high density material is extending in central calyces of the lower pole of the left kidney as seen in axial image 61. Findings may represent hemorrhagic products within cyst or cystic neoplasm. Further correlation with enhanced study and/or urology exam is recommended.  No nephrolithiasis. No calcified ureteral calculi. Oral contrast material was given to the patient. No small bowel or colonic obstruction. Stool noted in right colon and transverse colon. There is no pericecal inflammation. Sigmoid colon diverticula are noted. No evidence of acute diverticulitis. Prostate gland and seminal vesicles are  unremarkable. No calcified calculi are noted within urinary bladder. There is a small left hydrocele.  IMPRESSION: 1. Limited study without IV contrast. There is a central parapelvic cyst in midpole of the left kidney with peripheral high-density material. This measures 3.4 x 2.8 cm. Some of the high density material is extending in central calyces of the lower pole of the left kidney as seen in axial image 61. Findings may represent hemorrhagic products within cyst or cystic neoplasm. Further correlation with enhanced study and/or urology exam is recommended. 2. No nephrolithiasis. No calcified ureteral calculi. No hydronephrosis or hydroureter. 3. Multiple exophytic lesions bilateral kidney cannot be characterized without IV contrast. Although this may represent cysts some of the lesions are high density. Hemorrhagic or proteinaceous cysts cannot be excluded. 4. No calcified calculi are noted within urinary bladder. 5. Sigmoid colon diverticula are noted. No evidence of acute diverticulitis. 6. Small left hydrocele.   Electronically Signed   By: Lahoma Crocker M.D.   On: 04/12/2014 22:25   US Renal  04/13/2014   CLINICAL DATA:  Acute renal failure.  EXAM: RENAL/URINARY TRACT ULTRASOUND COMPLETE  COMPARISON:  CT scan of April 12, 2014.  FINDINGS: Right Kidney:  Length: 11.5 cm. Three rounded hypoechoic abnormalities are noted most consistent with cyst. The largest measures 1.8 cm. Echogenicity within normal limits. No mass or hydronephrosis visualized.  Left Kidney:  Length: 12.9 cm. Echogenicity within normal limits. Hypoechoic parapelvic abnormality is noted which measures 3.7 x 2.7 cm. It is not clearly a cyst by ultrasound criteria, and therefore further evaluation with MRI or CT with contrast is recommended to rule out mass. No hydronephrosis visualized.  Bladder:  Limited visualization as patient was not cooperative with the exam.  IMPRESSION: Right renal cysts are noted.  3.7 cm parapelvic rounded  abnormality is noted which is not clearly a cyst by ultrasound criteria. Further evaluation with MRI or CT scan with intravenous contrast is recommended to evaluate for possible neoplasm or malignancy.   Electronically Signed   By: Sabino Dick M.D.   On: 04/13/2014 15:34   Dg Chest Portable 1 View  04/12/2014   CLINICAL DATA:  Left flank pain all day. Kidney problems. Abdominal pain.  EXAM: PORTABLE CHEST - 1 VIEW  COMPARISON:  11/29/2012  FINDINGS: The heart size and mediastinal contours are within normal limits. Both lungs are clear. The visualized skeletal structures are unremarkable.  IMPRESSION: No active disease.   Electronically Signed   By: Lucienne Capers M.D.   On: 04/12/2014 21:08    I independently reviewed the above imaging studies.  Impression/Recommendation 1) Left renal complex renal cyst/hemturia: This is likely a benign hemorrhagic cyst that acutely bled. However, an underlying malignancy cannot be absolutely ruled out.  Diagnostic evaluation is complicated as he cannot receive IV contrast with CT or MR due to his renal dysfunction.  I have recommended he proceed with further endoscopic evaluation including cystoscopy and retrograde pyelography with possible left ureteroscopy to completely evaluate his hematuria and considering the central location of the lesion noted on his CT which would have a small risk of representing a urothelial carcinoma. This will be arranged as an outpatient in the next 1-2 weeks. I discussed the potential benefits and risks of the procedure, side effects of the proposed treatment, the likelihood of the patient achieving the goals of the procedure, and any potential problems that might occur during the procedure or recuperation.   If endoscopic evaluation is unremarkable, he will need follow up imaging with ultrasound or non-contrast MR to ensure no growth of his cystic mass over time.  Tavaughn Silguero,LES 04/13/2014, 8:28 PM    Pryor Curia MD   CC:  Dr. Lala Lund

## 2014-04-13 NOTE — Consult Note (Signed)
Reason for Consult: ARF on CKD Referring Physician: Dr. Candiss Norse  HPI: Terry [name] is an 79 y.o. male, PMH significant for CKD, COPD, CAD, HTN, stroke. Admitted on 04/12/2014 for left flank pain with possible ruptured renal cysts. He has known CKD (baseline Cr 2.1-2.3) with worsened Scr to 2.93 today. UA with large hgb, 100 protein, neg nitrites/leukocytes. He is also noted to have hyperkalemia, up to 6.2 today; received kayexalate today. He states he has continued to have left flank pain, but it is a little improved compared to yesterday. He has no sensation of passing a kidney stone, and has no history of them.   Trend in Creatinine: Creatinine, Ser  Date/Time Value Ref Range Status  04/13/2014  4:21 AM 2.93* 0.50 - 1.35 mg/dL Final  04/12/2014  6:59 PM 2.63* 0.50 - 1.35 mg/dL Final  12/12/2013  1:42 PM 2.3* 0.4 - 1.5 mg/dL Final  12/01/2013  2:46 PM 2.3* 0.4 - 1.5 mg/dL Final  11/29/2012 12:05 PM 2.1* 0.4 - 1.5 mg/dL Final    PMH:   Past Medical History  Diagnosis Date  . Stroke 2012  . High cholesterol   . Hypertension   . Kidney disease   . COPD (chronic obstructive pulmonary disease)   . CAD (coronary artery disease) [place], [name]  . Acute and chronic respiratory failure 10/22/2012  . Osteoarthritis 10/22/2012  . Migraine     PSH:   Past Surgical History  Procedure Laterality Date  . Coronary stent placement    . Appendectomy  1984  . Colon surgery  2012    partial colon removed - twisted bowel    Allergies:  Allergies  Allergen Reactions  . Other     Yellow Dye-- burns arms    Medications:   Prior to Admission medications   Medication Sig Start Date End Date Taking? Authorizing Provider  albuterol (PROVENTIL HFA;VENTOLIN HFA) 108 (90 BASE) MCG/ACT inhaler Inhale 2 puffs into the lungs every 6 (six) hours as needed for wheezing or shortness of breath. 12/01/13  Yes Eulas Post, MD  amLODipine (NORVASC) 10 MG tablet Take 1 tablet (10 mg total) by mouth  every morning. 08/18/13  Yes Burnell Blanks, MD  aspirin EC 81 MG tablet Take 81 mg by mouth daily.   Yes Historical Provider, MD  atorvastatin (LIPITOR) 10 MG tablet TAKE ONE TABLET BY MOUTH ONCE DAILY 10/26/13  Yes Burnell Blanks, MD  doxazosin (CARDURA) 4 MG tablet TAKE ONE TABLET BY MOUTH ONCE DAILY 03/08/14  Yes Burnell Blanks, MD  fluticasone-salmeterol (ADVAIR HFA) 115-21 MCG/ACT inhaler Inhale 2 puffs into the lungs 2 (two) times daily. 08/15/13  Yes Tanda Rockers, MD  furosemide (LASIX) 20 MG tablet TAKE ONE TABLET BY MOUTH ONCE DAILY 12/07/13  Yes Burnell Blanks, MD  losartan (COZAAR) 100 MG tablet TAKE ONE TABLET BY MOUTH ONCE DAILY 12/07/13  Yes Burnell Blanks, MD  metoprolol (LOPRESSOR) 100 MG tablet Take 1 tablet (100 mg total) by mouth daily. 06/06/13  Yes Burnell Blanks, MD  tiotropium (SPIRIVA) 18 MCG inhalation capsule Place 1 capsule (18 mcg total) into inhaler and inhale daily. 08/15/13  Yes Tanda Rockers, MD  oxyCODONE-acetaminophen (PERCOCET) 5-325 MG per tablet Take 1-2 tablets by mouth every 4 (four) hours as needed for severe pain. 04/12/14   Ephraim Hamburger, MD    Inpatient medications: . amLODipine  10 mg Oral q morning - 10a  . atorvastatin  10 mg  Oral q1800  . doxazosin  4 mg Oral Daily  . metoprolol  100 mg Oral Daily  . mometasone-formoterol  2 puff Inhalation BID  . morphine  15 mg Oral Q12H  . tiotropium  18 mcg Inhalation Daily    Discontinued Meds:   Medications Discontinued During This Encounter  Medication Reason  . aspirin 325 MG EC tablet Patient has not taken in last 30 days  . cyclobenzaprine (FLEXERIL) 5 MG tablet Patient has not taken in last 30 days  . nystatin (MYCOSTATIN) 100000 UNIT/ML suspension Completed Course  . potassium chloride SA (K-DUR,KLOR-CON) 20 MEQ tablet Discontinued by provider  . oxyCODONE-acetaminophen (PERCOCET/ROXICET) 5-325 MG per tablet 2 tablet   . morphine 4 MG/ML injection 4  mg   . 0.9 %  sodium chloride infusion   . sodium chloride 0.9 % injection 3 mL   . sodium chloride 0.9 % injection 3 mL   . ondansetron (ZOFRAN) tablet 4 mg   . aspirin EC tablet 81 mg     Social History:  reports that he quit smoking about 14 years ago. His smoking use included Cigarettes, Pipe, and Cigars. He has a 49 pack-year smoking history. He has never used smokeless tobacco. He reports that he does not drink alcohol or use illicit drugs.  Family History:   Family History  Problem Relation Age of Onset  . Cancer Mother   . Heart disease Father   . Heart attack Father   . Cancer Sister   . Parkinson's disease Brother     Constitutional: negative for anorexia, chills, fatigue and night sweats Eyes: negative for visual disturbance Respiratory: positive for wheezing Cardiovascular: negative for chest pain, dyspnea and palpitations Gastrointestinal: negative for constipation, diarrhea, dyspepsia and vomiting Genitourinary:negative for dysuria, frequency and urinary incontinence Integument/breast: negative for skin lesion(s) Weight change:   Intake/Output Summary (Last 24 hours) at 04/13/14 1428 Last data filed at 04/13/14 1338  Gross per 24 hour  Intake    480 ml  Output      0 ml  Net    480 ml   BP 142/59  Pulse 73  Temp(Src) 98.3 F (36.8 C) (Oral)  Resp 18  Ht 5\' 8"  (1.727 m)  Wt 205 lb 4 oz (93.1 kg)  BMI 31.22 kg/m2  SpO2 93% Filed Vitals:   04/13/14 0145 04/13/14 0435 04/13/14 1120 04/13/14 1344  BP: 158/61 120/59  142/59  Pulse: 87 71  73  Temp:  98.1 F (36.7 C)  98.3 F (36.8 C)  TempSrc:  Oral  Oral  Resp: 16 18  18   Height: 5\' 8"  (1.727 m)     Weight: 195 lb (88.451 kg) 205 lb 4 oz (93.1 kg)    SpO2: 92% 92% 93% 93%     General appearance: alert, cooperative and no distress Head: Normocephalic, without obvious abnormality, atraumatic Eyes: PERRL, EOMI. scleral anicteric. Throat: lips, mucosa, and tongue normal; teeth and gums normal Neck:  no adenopathy, no carotid bruit and no JVD Back: symmetric, no curvature. ROM normal. No CVA tenderness. Resp: wheezes scattered Chest wall: left sided chest wall tenderness Cardio: regular rate and rhythm, S1, S2 normal, no murmur, click, rub or gallop GI: abnormal findings:  LUQ and LLQ tenderness, no rebound. Distended, tympanic to percussion. +BS Extremities: extremities normal, atraumatic, no cyanosis or edema Pulses: 2+ and symmetric Skin: Skin color, texture, turgor normal. No rashes or lesions Neurologic: Grossly normal  Labs: Basic Metabolic Panel:  Recent Labs Lab 04/12/14 1859 04/13/14  0421 04/13/14 1344  NA 132* 134*  --   K 5.5* 6.2* 5.4*  CL 97 100  --   CO2 22 22  --   GLUCOSE 121* 124*  --   BUN 49* 49*  --   CREATININE 2.63* 2.93*  --   ALBUMIN 4.0  --   --   CALCIUM 9.2 8.3*  --    Liver Function Tests:  Recent Labs Lab 04/12/14 1859  AST 20  ALT 15  ALKPHOS 58  BILITOT 0.2*  PROT 7.3  ALBUMIN 4.0   No results found for this basename: LIPASE, AMYLASE,  in the last 168 hours No results found for this basename: AMMONIA,  in the last 168 hours CBC:  Recent Labs Lab 04/12/14 1859 04/13/14 0421  WBC 12.8* 13.6*  NEUTROABS 10.7*  --   HGB 10.9* 10.2*  HCT 31.3* 29.8*  MCV 86.5 87.9  PLT 233 241   PT/INR: @LABRCNTIP (inr:5) Cardiac Enzymes: )No results found for this basename: CKTOTAL, CKMB, CKMBINDEX, TROPONINI,  in the last 168 hours CBG: No results found for this basename: GLUCAP,  in the last 168 hours  Iron Studies: No results found for this basename: IRON, TIBC, TRANSFERRIN, FERRITIN,  in the last 168 hours  Xrays/Other Studies: Ct Abdomen Pelvis Wo Contrast  04/12/2014   CLINICAL DATA:  Left flank pain all day, no history of kidney stones, stage III renal failure  EXAM: CT ABDOMEN AND PELVIS WITHOUT CONTRAST  TECHNIQUE: Multidetector CT imaging of the abdomen and pelvis was performed following the standard protocol without IV  contrast.  COMPARISON:  None.  FINDINGS: Lung bases are unremarkable. The study is limited without IV contrast. Unenhanced liver shows no biliary ductal dilatation.  No calcified gallstones are noted within gallbladder. Unenhanced pancreas spleen and adrenal glands are unremarkable. Extensive atherosclerotic calcifications of abdominal aorta and iliac arteries. No aortic aneurysm.  Unenhanced kidneys shows a lobulated contour. Multiple exophytic lesions are noted bilateral kidneys. This may represent renal cyst but cannot be characterized without IV contrast. A probable cyst in lower pole of the right kidney measures 1.2 cm. Probable cyst in upper pole of the right kidney measures 1.4 cm. There are few high density exophytic lesions. This may represent hemorrhagic or proteinaceous cyst. The largest in upper pole of the right kidney measures 8 mm. The largest high density lesion in upper pole of the left kidney measures 8 mm.  There is a central parapelvic cyst in midpole of the left kidney with peripheral high-density material. This measures 3.4 x 2.8 cm. Some of the high density material is extending in central calyces of the lower pole of the left kidney as seen in axial image 61. Findings may represent hemorrhagic products within cyst or cystic neoplasm. Further correlation with enhanced study and/or urology exam is recommended.  No nephrolithiasis. No calcified ureteral calculi. Oral contrast material was given to the patient. No small bowel or colonic obstruction. Stool noted in right colon and transverse colon. There is no pericecal inflammation. Sigmoid colon diverticula are noted. No evidence of acute diverticulitis. Prostate gland and seminal vesicles are unremarkable. No calcified calculi are noted within urinary bladder. There is a small left hydrocele.  IMPRESSION: 1. Limited study without IV contrast. There is a central parapelvic cyst in midpole of the left kidney with peripheral high-density material.  This measures 3.4 x 2.8 cm. Some of the high density material is extending in central calyces of the lower pole of the left kidney as seen  in axial image 61. Findings may represent hemorrhagic products within cyst or cystic neoplasm. Further correlation with enhanced study and/or urology exam is recommended. 2. No nephrolithiasis. No calcified ureteral calculi. No hydronephrosis or hydroureter. 3. Multiple exophytic lesions bilateral kidney cannot be characterized without IV contrast. Although this may represent cysts some of the lesions are high density. Hemorrhagic or proteinaceous cysts cannot be excluded. 4. No calcified calculi are noted within urinary bladder. 5. Sigmoid colon diverticula are noted. No evidence of acute diverticulitis. 6. Small left hydrocele.   Electronically Signed   By: Lahoma Crocker M.D.   On: 04/12/2014 22:25   Dg Chest Portable 1 View  04/12/2014   CLINICAL DATA:  Left flank pain all day. Kidney problems. Abdominal pain.  EXAM: PORTABLE CHEST - 1 VIEW  COMPARISON:  11/29/2012  FINDINGS: The heart size and mediastinal contours are within normal limits. Both lungs are clear. The visualized skeletal structures are unremarkable.  IMPRESSION: No active disease.   Electronically Signed   By: Lucienne Capers M.D.   On: 04/12/2014 21:08     Assessment/Plan: Left flank pain: possible ruptured hemorrhagic renal cyst, low suspicion for kidney stone. UA with large hgb. Pending renal ultrasound and urine pr/cr collection. 1. Acute on CKD: baseline Scr 2.1-2.3. Secondary to above, possible ruptured cyst.  2. Hyperkalemia: received kayexalate x 1. Can repeat this as needed. 3. Hypertension: stable 4. COPD: per primary   Tawanna Sat 04/13/2014, 2:28 PM   Renal Attending: Pt is known to Dr. Joelyn Oms in our practice.  On 03/21/14 creat was 2.49mg /dl. On June 23 cr was 2.3mg /dl.  He has relatively acute left flank pain with microscopic hematuria, renal cysts noted by CT scan with blood  versus other material within the cyst.  Creatinine has bumped up overnight. Ultrasound shows no obstruction.  He will need IVF hydration, pain control and urology follow up to exclude malignancy. Kymberlyn Eckford C

## 2014-04-13 NOTE — Progress Notes (Signed)
Patient Demographics  Terry [name], is a 79 y.o. male, DOB - 13-Oct-1934, [id number]  Admit date - 04/12/2014   Admitting Physician Ivor Costa, MD  Outpatient Primary MD for the patient is [name], DO  LOS - 1   Chief Complaint  Patient presents with  . Back Pain  . Flank Pain        Subjective:   [name] today has, No headache, No chest pain, positive left-sided flank pain - No Nausea, No new weakness tingling or numbness, No Cough - SOB.    Assessment & Plan    1. Left flank pain - question due to hemorrhage and a renal cyst. Discussed with urologist on call Dr. Karsten Ro for now he recommends conservative treatment with bedrest, pain control and IV fluids, I will repeat renal ultrasound, no signs of renal stone, continue to monitor closely.   2. Acute renal failure on top of chronic disease stage IV. Baseline creatinine around 2. Has certainly worsened, hydrate, avoid nephrotoxins, obtain urine electrolytes. Have requested nephrology to evaluate.   3. Hypokalemia with acute renal failure. IV fluid with one-time Lasix, Kayexalate, will monitor, repeat potassium shortly.   4. Hypertension. Stable on beta blocker and Norvasc continue.   5. CAD. No acute issue. Continue beta blocker, hold aspirin for now.       Code Status: Full  Family Communication:wife  Disposition Plan: home   Procedures CT scan abdomen pelvis, renal ultrasound   Consults nephrology, I discussed his case with urologist on call Dr. Karsten Ro personally currently nothing to offer.   Medications  Scheduled Meds: . amLODipine  10 mg Oral q morning - 10a  . aspirin EC  81 mg Oral Daily  . atorvastatin  10 mg Oral q1800  . doxazosin  4 mg Oral Daily  . metoprolol  100 mg Oral Daily  .  mometasone-formoterol  2 puff Inhalation BID  . morphine  15 mg Oral Q12H  . tiotropium  18 mcg Inhalation Daily   Continuous Infusions: . sodium chloride 100 mL/hr at 04/13/14 0732   PRN Meds:.albuterol, morphine injection, naLOXone (NARCAN)  injection, ondansetron (ZOFRAN) IV  DVT Prophylaxis SCDs    Lab Results  Component Value Date   PLT 241 04/13/2014    Antibiotics     Anti-infectives   None          Objective:   Filed Vitals:   04/12/14 2331 04/13/14 0107 04/13/14 0145 04/13/14 0435  BP: 141/59 146/68 158/61 120/59  Pulse: 82 84 87 71  Temp: 98.3 F (36.8 C)   98.1 F (36.7 C)  TempSrc: Oral   Oral  Resp: 16  16 18   Height:   5\' 8"  (1.727 m)   Weight:   88.451 kg (195 lb) 93.1 kg (205 lb 4 oz)  SpO2: 99% 92% 92% 92%    Wt Readings from Last 3 Encounters:  04/13/14 93.1 kg (205 lb 4 oz)  12/01/13 88.905 kg (196 lb)  11/02/13 88.451 kg (195 lb)     Intake/Output Summary (Last 24 hours) at 04/13/14 1050 Last data filed at 04/13/14 0947  Gross per 24 hour  Intake      0 ml  Output      0  ml  Net      0 ml     Physical Exam  Awake Alert, Oriented X 3, No new F.N deficits, Normal affect [place].AT,PERRAL Supple Neck,No JVD, No cervical lymphadenopathy appriciated.  Symmetrical Chest wall movement, Good air movement bilaterally, CTAB RRR,No Gallops,Rubs or new Murmurs, No Parasternal Heave +ve B.Sounds, Abd Soft, No tenderness, No organomegaly appriciated, No rebound - guarding or rigidity. No Cyanosis, Clubbing or edema, No new Rash or bruise     Data Review   Micro Results No results found for this or any previous visit (from the past 240 hour(s)).  Radiology Reports Ct Abdomen Pelvis Wo Contrast  04/12/2014   CLINICAL DATA:  Left flank pain all day, no history of kidney stones, stage III renal failure  EXAM: CT ABDOMEN AND PELVIS WITHOUT CONTRAST  TECHNIQUE: Multidetector CT imaging of the abdomen and pelvis was performed following the  standard protocol without IV contrast.  COMPARISON:  None.  FINDINGS: Lung bases are unremarkable. The study is limited without IV contrast. Unenhanced liver shows no biliary ductal dilatation.  No calcified gallstones are noted within gallbladder. Unenhanced pancreas spleen and adrenal glands are unremarkable. Extensive atherosclerotic calcifications of abdominal aorta and iliac arteries. No aortic aneurysm.  Unenhanced kidneys shows a lobulated contour. Multiple exophytic lesions are noted bilateral kidneys. This may represent renal cyst but cannot be characterized without IV contrast. A probable cyst in lower pole of the right kidney measures 1.2 cm. Probable cyst in upper pole of the right kidney measures 1.4 cm. There are few high density exophytic lesions. This may represent hemorrhagic or proteinaceous cyst. The largest in upper pole of the right kidney measures 8 mm. The largest high density lesion in upper pole of the left kidney measures 8 mm.  There is a central parapelvic cyst in midpole of the left kidney with peripheral high-density material. This measures 3.4 x 2.8 cm. Some of the high density material is extending in central calyces of the lower pole of the left kidney as seen in axial image 61. Findings may represent hemorrhagic products within cyst or cystic neoplasm. Further correlation with enhanced study and/or urology exam is recommended.  No nephrolithiasis. No calcified ureteral calculi. Oral contrast material was given to the patient. No small bowel or colonic obstruction. Stool noted in right colon and transverse colon. There is no pericecal inflammation. Sigmoid colon diverticula are noted. No evidence of acute diverticulitis. Prostate gland and seminal vesicles are unremarkable. No calcified calculi are noted within urinary bladder. There is a small left hydrocele.  IMPRESSION: 1. Limited study without IV contrast. There is a central parapelvic cyst in midpole of the left kidney with  peripheral high-density material. This measures 3.4 x 2.8 cm. Some of the high density material is extending in central calyces of the lower pole of the left kidney as seen in axial image 61. Findings may represent hemorrhagic products within cyst or cystic neoplasm. Further correlation with enhanced study and/or urology exam is recommended. 2. No nephrolithiasis. No calcified ureteral calculi. No hydronephrosis or hydroureter. 3. Multiple exophytic lesions bilateral kidney cannot be characterized without IV contrast. Although this may represent cysts some of the lesions are high density. Hemorrhagic or proteinaceous cysts cannot be excluded. 4. No calcified calculi are noted within urinary bladder. 5. Sigmoid colon diverticula are noted. No evidence of acute diverticulitis. 6. Small left hydrocele.   Electronically Signed   By: Lahoma Crocker M.D.   On: 04/12/2014 22:25   Dg Chest Portable  1 View  04/12/2014   CLINICAL DATA:  Left flank pain all day. Kidney problems. Abdominal pain.  EXAM: PORTABLE CHEST - 1 VIEW  COMPARISON:  11/29/2012  FINDINGS: The heart size and mediastinal contours are within normal limits. Both lungs are clear. The visualized skeletal structures are unremarkable.  IMPRESSION: No active disease.   Electronically Signed   By: Lucienne Capers M.D.   On: 04/12/2014 21:08     CBC  Recent Labs Lab 04/12/14 1859 04/13/14 0421  WBC 12.8* 13.6*  HGB 10.9* 10.2*  HCT 31.3* 29.8*  PLT 233 241  MCV 86.5 87.9  MCH 30.1 30.1  MCHC 34.8 34.2  RDW 12.9 13.2  LYMPHSABS 1.1  --   MONOABS 0.8  --   EOSABS 0.2  --   BASOSABS 0.0  --     Chemistries   Recent Labs Lab 04/12/14 1859 04/13/14 0421  NA 132* 134*  K 5.5* 6.2*  CL 97 100  CO2 22 22  GLUCOSE 121* 124*  BUN 49* 49*  CREATININE 2.63* 2.93*  CALCIUM 9.2 8.3*  AST 20  --   ALT 15  --   ALKPHOS 58  --   BILITOT 0.2*  --     ------------------------------------------------------------------------------------------------------------------ estimated creatinine clearance is 23.8 ml/min (by C-G formula based on Cr of 2.93). ------------------------------------------------------------------------------------------------------------------ No results found for this basename: HGBA1C,  in the last 72 hours ------------------------------------------------------------------------------------------------------------------ No results found for this basename: CHOL, HDL, LDLCALC, TRIG, CHOLHDL, LDLDIRECT,  in the last 72 hours ------------------------------------------------------------------------------------------------------------------ No results found for this basename: TSH, T4TOTAL, FREET3, T3FREE, THYROIDAB,  in the last 72 hours ------------------------------------------------------------------------------------------------------------------ No results found for this basename: VITAMINB12, FOLATE, FERRITIN, TIBC, IRON, RETICCTPCT,  in the last 72 hours  Coagulation profile  Recent Labs Lab 04/13/14 0421  INR 1.05    No results found for this basename: DDIMER,  in the last 72 hours  Cardiac Enzymes No results found for this basename: CK, CKMB, TROPONINI, MYOGLOBIN,  in the last 168 hours ------------------------------------------------------------------------------------------------------------------ No components found with this basename: POCBNP,      Time Spent in minutes   35   Gor Vestal K M.D on 04/13/2014 at 10:50 AM  Between 7am to 7pm - Pager - [phone]  After 7pm go to [email or web address] - password [email or web address]  And look for the night coverage person covering for me after hours  [facility] Office  [phone]

## 2014-04-13 NOTE — H&P (Addendum)
[facility] History and Physical  Terry [name] [id number] DOB: 1934-11-25 DOA: 04/12/2014  Referring physician: ED physician PCP: [name], DO  Specialists:   Chief Complaint: Left flank pain  HPI: Terry [name] is a 79 y.o. male with past medical history of coronary artery disease, s/p of stent placement, hyperlipidemia, hypertension, COPD, chronic kidney disease-stage III, who presents with left flank pain.  Patient reports that at about 2 PM yesterday, he started having left flank pain. It has been progressively getting worse. It is constant, radiating to the front of his abdomen, "15/10 in severity". He does not have fever, chills, nausea, vomiting, diarrhea. He does not have shortness of breath, chest pain. He did not notice any blood in his urine or stool.  Ct-abd/pelvis suggested ruptured cyst or cystic neoplasm and no nephrolithiasis. He is admitted to inpatient for further evaluation and treatment. ED physician spoke to urologist, who suggested conservative treatment and pain control tonight. Patient has ypokalemia with a potassium of 5.5 on admission.   Review of Systems: As presented in the history of presenting illness, rest negative.  Where does patient live? Lives with his wife at home   Can patient participate in ADLs? Yes  Allergy:  Allergies  Allergen Reactions  . Other     Yellow Dye-- burns arms    Past Medical History  Diagnosis Date  . Stroke 2012  . High cholesterol   . Hypertension   . Kidney disease   . COPD (chronic obstructive pulmonary disease)   . CAD (coronary artery disease) [place], [name]  . Acute and chronic respiratory failure 10/22/2012  . Osteoarthritis 10/22/2012  . Migraine     Past Surgical History  Procedure Laterality Date  . Coronary stent placement    . Appendectomy  1984  . Colon surgery  2012    partial colon removed - twisted bowel    Social History:  reports that he quit smoking about 14 years ago.  His smoking use included Cigarettes, Pipe, and Cigars. He has a 49 pack-year smoking history. He has never used smokeless tobacco. He reports that he does not drink alcohol or use illicit drugs.  Family History:  Family History  Problem Relation Age of Onset  . Cancer Mother   . Heart disease Father   . Heart attack Father   . Cancer Sister   . Parkinson's disease Brother      Prior to Admission medications   Medication Sig Start Date End Date Taking? Authorizing Provider  albuterol (PROVENTIL HFA;VENTOLIN HFA) 108 (90 BASE) MCG/ACT inhaler Inhale 2 puffs into the lungs every 6 (six) hours as needed for wheezing or shortness of breath. 12/01/13  Yes Eulas Post, MD  amLODipine (NORVASC) 10 MG tablet Take 1 tablet (10 mg total) by mouth every morning. 08/18/13  Yes Burnell Blanks, MD  aspirin EC 81 MG tablet Take 81 mg by mouth daily.   Yes Historical Provider, MD  atorvastatin (LIPITOR) 10 MG tablet TAKE ONE TABLET BY MOUTH ONCE DAILY 10/26/13  Yes Burnell Blanks, MD  doxazosin (CARDURA) 4 MG tablet TAKE ONE TABLET BY MOUTH ONCE DAILY 03/08/14  Yes Burnell Blanks, MD  fluticasone-salmeterol (ADVAIR HFA) 115-21 MCG/ACT inhaler Inhale 2 puffs into the lungs 2 (two) times daily. 08/15/13  Yes Tanda Rockers, MD  furosemide (LASIX) 20 MG tablet TAKE ONE TABLET BY MOUTH ONCE DAILY 12/07/13  Yes Burnell Blanks, MD  losartan (COZAAR) 100 MG  tablet TAKE ONE TABLET BY MOUTH ONCE DAILY 12/07/13  Yes Burnell Blanks, MD  metoprolol (LOPRESSOR) 100 MG tablet Take 1 tablet (100 mg total) by mouth daily. 06/06/13  Yes Burnell Blanks, MD  tiotropium (SPIRIVA) 18 MCG inhalation capsule Place 1 capsule (18 mcg total) into inhaler and inhale daily. 08/15/13  Yes Tanda Rockers, MD  oxyCODONE-acetaminophen (PERCOCET) 5-325 MG per tablet Take 1-2 tablets by mouth every 4 (four) hours as needed for severe pain. 04/12/14   Ephraim Hamburger, MD    Physical Exam: Filed  Vitals:   04/12/14 2315 04/12/14 2330 04/12/14 2331 04/13/14 0107  BP: 151/61 141/59 141/59 146/68  Pulse: 83 80 82 84  Temp:   98.3 F (36.8 C)   TempSrc:   Oral   Resp: 19 15 16    Height:      Weight:      SpO2: 94% 96% 99% 92%   General:  in moderate distress due to pain HEENT:       Eyes: PERRL, EOMI, no scleral icterus       ENT: No discharge from the ears and nose, no pharynx injection, no tonsillar enlargement.        Neck: No JVD, no bruit, no mass felt. Cardiac: S1/S2, RRR, No murmurs, gallops or rubs Pulm: Good air movement bilaterally. Clear to auscultation bilaterally. No rales, wheezing, rhonchi or rubs. Abd: Soft. There is tenderness in the periumbilical area and left lower quadrant. There is left CVA  Ext: trace leg edema bilaterally. 2+DP/PT pulse bilaterally Musculoskeletal: No joint deformities, erythema, or stiffness, ROM full Skin: No rashes.  Neuro: Alert and oriented X3, cranial nerves II-XII grossly intact, muscle strength 5/5 in all extremeties, sensation to light touch intact.  Psych: Patient is not psychotic, no suicidal or hemocidal ideation.  Labs on Admission:  Basic Metabolic Panel:  Recent Labs Lab 04/12/14 1859  NA 132*  K 5.5*  CL 97  CO2 22  GLUCOSE 121*  BUN 49*  CREATININE 2.63*  CALCIUM 9.2   Liver Function Tests:  Recent Labs Lab 04/12/14 1859  AST 20  ALT 15  ALKPHOS 58  BILITOT 0.2*  PROT 7.3  ALBUMIN 4.0   No results found for this basename: LIPASE, AMYLASE,  in the last 168 hours No results found for this basename: AMMONIA,  in the last 168 hours CBC:  Recent Labs Lab 04/12/14 1859  WBC 12.8*  NEUTROABS 10.7*  HGB 10.9*  HCT 31.3*  MCV 86.5  PLT 233   Cardiac Enzymes: No results found for this basename: CKTOTAL, CKMB, CKMBINDEX, TROPONINI,  in the last 168 hours  BNP (last 3 results) No results found for this basename: PROBNP,  in the last 8760 hours CBG: No results found for this basename: GLUCAP,  in  the last 168 hours  Radiological Exams on Admission: Ct Abdomen Pelvis Wo Contrast  04/12/2014   CLINICAL DATA:  Left flank pain all day, no history of kidney stones, stage III renal failure  EXAM: CT ABDOMEN AND PELVIS WITHOUT CONTRAST  TECHNIQUE: Multidetector CT imaging of the abdomen and pelvis was performed following the standard protocol without IV contrast.  COMPARISON:  None.  FINDINGS: Lung bases are unremarkable. The study is limited without IV contrast. Unenhanced liver shows no biliary ductal dilatation.  No calcified gallstones are noted within gallbladder. Unenhanced pancreas spleen and adrenal glands are unremarkable. Extensive atherosclerotic calcifications of abdominal aorta and iliac arteries. No aortic aneurysm.  Unenhanced kidneys shows a  lobulated contour. Multiple exophytic lesions are noted bilateral kidneys. This may represent renal cyst but cannot be characterized without IV contrast. A probable cyst in lower pole of the right kidney measures 1.2 cm. Probable cyst in upper pole of the right kidney measures 1.4 cm. There are few high density exophytic lesions. This may represent hemorrhagic or proteinaceous cyst. The largest in upper pole of the right kidney measures 8 mm. The largest high density lesion in upper pole of the left kidney measures 8 mm.  There is a central parapelvic cyst in midpole of the left kidney with peripheral high-density material. This measures 3.4 x 2.8 cm. Some of the high density material is extending in central calyces of the lower pole of the left kidney as seen in axial image 61. Findings may represent hemorrhagic products within cyst or cystic neoplasm. Further correlation with enhanced study and/or urology exam is recommended.  No nephrolithiasis. No calcified ureteral calculi. Oral contrast material was given to the patient. No small bowel or colonic obstruction. Stool noted in right colon and transverse colon. There is no pericecal inflammation. Sigmoid  colon diverticula are noted. No evidence of acute diverticulitis. Prostate gland and seminal vesicles are unremarkable. No calcified calculi are noted within urinary bladder. There is a small left hydrocele.  IMPRESSION: 1. Limited study without IV contrast. There is a central parapelvic cyst in midpole of the left kidney with peripheral high-density material. This measures 3.4 x 2.8 cm. Some of the high density material is extending in central calyces of the lower pole of the left kidney as seen in axial image 61. Findings may represent hemorrhagic products within cyst or cystic neoplasm. Further correlation with enhanced study and/or urology exam is recommended. 2. No nephrolithiasis. No calcified ureteral calculi. No hydronephrosis or hydroureter. 3. Multiple exophytic lesions bilateral kidney cannot be characterized without IV contrast. Although this may represent cysts some of the lesions are high density. Hemorrhagic or proteinaceous cysts cannot be excluded. 4. No calcified calculi are noted within urinary bladder. 5. Sigmoid colon diverticula are noted. No evidence of acute diverticulitis. 6. Small left hydrocele.   Electronically Signed   By: Lahoma Crocker M.D.   On: 04/12/2014 22:25   Dg Chest Portable 1 View  04/12/2014   CLINICAL DATA:  Left flank pain all day. Kidney problems. Abdominal pain.  EXAM: PORTABLE CHEST - 1 VIEW  COMPARISON:  11/29/2012  FINDINGS: The heart size and mediastinal contours are within normal limits. Both lungs are clear. The visualized skeletal structures are unremarkable.  IMPRESSION: No active disease.   Electronically Signed   By: Lucienne Capers M.D.   On: 04/12/2014 21:08    EKG: Independently reviewed.   Assessment/Plan Principal Problem:   Left flank pain Active Problems:   COPD GOLD III   Chronic kidney disease, stage III (moderate)   CAD (coronary artery disease) of artery bypass graft   HTN (hypertension)   Hyperkalemia  # Left flank pain: likely due to  renal cyst rupture as suggested by CT-abd/pelvis findings. ED physician spoke to urologist, Dr. Karsten Ro, who suggested conservative treatment and pain control tonight. Currently patient is hemodynamically stable. His hemoglobin is stable from 10.7 on 12/01/13 to 10.9 today.   -will admit to MedSurg bed -Pain control: MS-contin and when necessary morphine IV given very severe pain -Check CBC now, and in the morning. -may need to call urology again in the morning since ED physician is not very sure whether they will come to see patient  in the AM. -NPO for now in case he needs procedure   # CDK-III: Baseline creatinine 2.1-2.3, creatinine is 2.63 on admission which is slightly higher than baseline.  -will hold losartan -Will hold Lasix tonight while on NPO -No IVF given he has grade 1 diastolic dysfunction.   # sCHF: 2-D echo on 02/03/13 showed EF of 50-55% with a grade 1 diastolic dysfunction. Patient's volume status is a 5 on admission. He has a trace amount of leg edema, no DVT. Breathing normally.  - holed lasix as above - watch volume status closely, revealed Lasix if he needed  - check ProBNP  # CAD: s/p of stent 1999. No chest pain. -continue home medications: Aspirin, Lipitor, metoprolol.  # HTN:  -Hold losartan and Lasix as above -Continue amlodipine, metoprolol  # COPD: Stable, no signs of acute exacerbation. On auscultation is clear bilaterally. -Continue home breathing treatment  # Hyperkalemia: K=5.5, without EKG change -will repeat BMP now.  DVT ppx: SCD  Code Status: Full code Family Communication: Yes, patient's wife  at bed side Disposition Plan: Admit to inpatient   Date of Service 04/13/2014    Ivor Costa [facility] Pager [phone]  If 7PM-7AM, please contact night-coverage [email or web address] Password [email or web address] 04/13/2014, 1:19 AM

## 2014-04-14 ENCOUNTER — Inpatient Hospital Stay (HOSPITAL_COMMUNITY): Payer: Medicare PPO

## 2014-04-14 DIAGNOSIS — R1012 Left upper quadrant pain: Secondary | ICD-10-CM

## 2014-04-14 DIAGNOSIS — R109 Unspecified abdominal pain: Secondary | ICD-10-CM

## 2014-04-14 LAB — CBC
HCT: 29.5 % — ABNORMAL LOW (ref 39.0–52.0)
Hemoglobin: 9.7 g/dL — ABNORMAL LOW (ref 13.0–17.0)
MCH: 29.9 pg (ref 26.0–34.0)
MCHC: 32.9 g/dL (ref 30.0–36.0)
MCV: 91 fL (ref 78.0–100.0)
Platelets: 217 10*3/uL (ref 150–400)
RBC: 3.24 MIL/uL — ABNORMAL LOW (ref 4.22–5.81)
RDW: 13.6 % (ref 11.5–15.5)
WBC: 16.2 10*3/uL — ABNORMAL HIGH (ref 4.0–10.5)

## 2014-04-14 LAB — BASIC METABOLIC PANEL
Anion gap: 16 — ABNORMAL HIGH (ref 5–15)
BUN: 55 mg/dL — ABNORMAL HIGH (ref 6–23)
CALCIUM: 8 mg/dL — AB (ref 8.4–10.5)
CO2: 19 meq/L (ref 19–32)
CREATININE: 3.7 mg/dL — AB (ref 0.50–1.35)
Chloride: 99 mEq/L (ref 96–112)
GFR calc Af Amer: 17 mL/min — ABNORMAL LOW (ref >90)
GFR, EST NON AFRICAN AMERICAN: 15 mL/min — AB (ref >90)
GLUCOSE: 97 mg/dL (ref 70–99)
Potassium: 5.3 mEq/L (ref 3.7–5.3)
Sodium: 134 mEq/L — ABNORMAL LOW (ref 137–147)

## 2014-04-14 LAB — HEMOGLOBIN AND HEMATOCRIT, BLOOD
HCT: 27.3 % — ABNORMAL LOW (ref 39.0–52.0)
Hemoglobin: 9.2 g/dL — ABNORMAL LOW (ref 13.0–17.0)

## 2014-04-14 LAB — OSMOLALITY, URINE: Osmolality, Ur: 371 mOsm/kg — ABNORMAL LOW (ref 390–1090)

## 2014-04-14 MED ORDER — BISACODYL 10 MG RE SUPP
10.0000 mg | Freq: Every day | RECTAL | Status: DC
  Administered 2014-04-14 – 2014-04-17 (×3): 10 mg via RECTAL
  Filled 2014-04-14 (×3): qty 1

## 2014-04-14 MED ORDER — FLUTICASONE PROPIONATE 50 MCG/ACT NA SUSP
1.0000 | Freq: Every day | NASAL | Status: DC
  Administered 2014-04-14 – 2014-04-17 (×2): 1 via NASAL
  Filled 2014-04-14: qty 16

## 2014-04-14 MED ORDER — SODIUM CHLORIDE 0.9 % IV SOLN
INTRAVENOUS | Status: AC
  Administered 2014-04-14 – 2014-04-15 (×2): via INTRAVENOUS

## 2014-04-14 MED ORDER — POLYETHYLENE GLYCOL 3350 17 G PO PACK
17.0000 g | PACK | Freq: Every day | ORAL | Status: DC
  Administered 2014-04-14 – 2014-04-18 (×5): 17 g via ORAL
  Filled 2014-04-14 (×5): qty 1

## 2014-04-14 MED ORDER — CEFTRIAXONE SODIUM 1 G IJ SOLR
1.0000 g | INTRAMUSCULAR | Status: DC
  Administered 2014-04-14 – 2014-04-18 (×5): 1 g via INTRAVENOUS
  Filled 2014-04-14 (×6): qty 10

## 2014-04-14 MED ORDER — DOCUSATE SODIUM 100 MG PO CAPS
200.0000 mg | ORAL_CAPSULE | Freq: Two times a day (BID) | ORAL | Status: DC
  Administered 2014-04-14 – 2014-04-19 (×11): 200 mg via ORAL
  Filled 2014-04-14 (×12): qty 2

## 2014-04-14 NOTE — Progress Notes (Signed)
Patient Demographics  Terry [name], is a 79 y.o. male, DOB - [birth year], SA:[id number]  Admit date - 04/12/2014   Admitting Physician Ivor Costa, MD  Outpatient Primary MD for the patient is [name], DO  LOS - 2   Chief Complaint  Patient presents with  . Back Pain  . Flank Pain        Subjective:   Terry [name] today has, No headache, No chest pain, positive left-sided flank pain and sone lower quadrant pain - No Nausea, No new weakness tingling or numbness, No Cough - SOB.    Assessment & Plan    1. Left flank pain -  due to hemorrhage and a renal cyst repeat CT confirms, Urology following, D/W Dr Alinda Money on 04-14-14, likely cystoscopy outpatient, will continue to follow closely his symptoms and renal function. Is the lump some perinephric stranding on the left side, leukocytosis slightly worse up to 16,000. Most likely due to acute stress from cystic hemorrhage however will give him empiric IV Rocephin in case his cyst is infected.    2. Acute renal failure on top of chronic disease stage IV. Baseline creatinine around 2. Has certainly worsened, hydrate, avoid nephrotoxins, obtain urine electrolytes. Renal following.    3. Hyperkalemia -  Resolved after Rx.    4. Hypertension. Stable on beta blocker and Norvasc continue.    5. CAD. No acute issue. Continue beta blocker, hold aspirin for now.    6.Constipation. Place on bowel regimen and monitor.    Code Status: Full  Family Communication:wife  Disposition Plan: home   Procedures CT scan abdomen pelvis, renal ultrasound   Consults nephrology, I discussed his case with urologist on call Dr. Karsten Ro personally currently nothing to offer.   Medications  Scheduled Meds: . amLODipine  10 mg Oral q morning - 10a    . atorvastatin  10 mg Oral q1800  . bisacodyl  10 mg Rectal Daily  . cefTRIAXone (ROCEPHIN)  IV  1 g Intravenous Q24H  . docusate sodium  200 mg Oral BID  . doxazosin  4 mg Oral Daily  . metoprolol  100 mg Oral Daily  . mometasone-formoterol  2 puff Inhalation BID  . morphine  15 mg Oral Q12H  . polyethylene glycol  17 g Oral Daily  . tiotropium  18 mcg Inhalation Daily   Continuous Infusions: . sodium chloride 100 mL/hr at 04/14/14 0845   PRN Meds:.albuterol, morphine injection, naLOXone (NARCAN)  injection, ondansetron (ZOFRAN) IV  DVT Prophylaxis SCDs    Lab Results  Component Value Date   PLT 217 04/14/2014    Antibiotics     Anti-infectives   Start     Dose/Rate Route Frequency Ordered Stop   04/14/14 1300  cefTRIAXone (ROCEPHIN) 1 g in dextrose 5 % 50 mL IVPB     1 g 100 mL/hr over 30 Minutes Intravenous Every 24 hours 04/14/14 1158            Objective:   Filed Vitals:   04/13/14 1344 04/13/14 2210 04/14/14 0505 04/14/14 0752  BP: 142/59 166/74 146/55   Pulse: 73 87 83   Temp: 98.3 F (36.8 C) 98.9 F (37.2 C) 98.4 F (36.9 C)   TempSrc: Oral Oral  Resp: 18 18 18    Height:      Weight:   93.486 kg (206 lb 1.6 oz)   SpO2: 93% 93% 94% 94%    Wt Readings from Last 3 Encounters:  04/14/14 93.486 kg (206 lb 1.6 oz)  12/01/13 88.905 kg (196 lb)  11/02/13 88.451 kg (195 lb)     Intake/Output Summary (Last 24 hours) at 04/14/14 1244 Last data filed at 04/14/14 1143  Gross per 24 hour  Intake   2456 ml  Output   1126 ml  Net   1330 ml     Physical Exam  Awake Alert, Oriented X 3, No new F.N deficits, Normal affect Robertson.AT,PERRAL Supple Neck,No JVD, No cervical lymphadenopathy appriciated.  Symmetrical Chest wall movement, Good air movement bilaterally, CTAB RRR,No Gallops,Rubs or new Murmurs, No Parasternal Heave +ve B.Sounds, Abd mildly distended, +ve L flank tenderness, No organomegaly appriciated, No rebound - guarding or rigidity. No  Cyanosis, Clubbing or edema, No new Rash or bruise     Data Review   Micro Results No results found for this or any previous visit (from the past 240 hour(s)).  Radiology Reports Ct Abdomen Pelvis Wo Contrast  04/12/2014   CLINICAL DATA:  Left flank pain all day, no history of kidney stones, stage III renal failure  EXAM: CT ABDOMEN AND PELVIS WITHOUT CONTRAST  TECHNIQUE: Multidetector CT imaging of the abdomen and pelvis was performed following the standard protocol without IV contrast.  COMPARISON:  None.  FINDINGS: Lung bases are unremarkable. The study is limited without IV contrast. Unenhanced liver shows no biliary ductal dilatation.  No calcified gallstones are noted within gallbladder. Unenhanced pancreas spleen and adrenal glands are unremarkable. Extensive atherosclerotic calcifications of abdominal aorta and iliac arteries. No aortic aneurysm.  Unenhanced kidneys shows a lobulated contour. Multiple exophytic lesions are noted bilateral kidneys. This may represent renal cyst but cannot be characterized without IV contrast. A probable cyst in lower pole of the right kidney measures 1.2 cm. Probable cyst in upper pole of the right kidney measures 1.4 cm. There are few high density exophytic lesions. This may represent hemorrhagic or proteinaceous cyst. The largest in upper pole of the right kidney measures 8 mm. The largest high density lesion in upper pole of the left kidney measures 8 mm.  There is a central parapelvic cyst in midpole of the left kidney with peripheral high-density material. This measures 3.4 x 2.8 cm. Some of the high density material is extending in central calyces of the lower pole of the left kidney as seen in axial image 61. Findings may represent hemorrhagic products within cyst or cystic neoplasm. Further correlation with enhanced study and/or urology exam is recommended.  No nephrolithiasis. No calcified ureteral calculi. Oral contrast material was given to the patient.  No small bowel or colonic obstruction. Stool noted in right colon and transverse colon. There is no pericecal inflammation. Sigmoid colon diverticula are noted. No evidence of acute diverticulitis. Prostate gland and seminal vesicles are unremarkable. No calcified calculi are noted within urinary bladder. There is a small left hydrocele.  IMPRESSION: 1. Limited study without IV contrast. There is a central parapelvic cyst in midpole of the left kidney with peripheral high-density material. This measures 3.4 x 2.8 cm. Some of the high density material is extending in central calyces of the lower pole of the left kidney as seen in axial image 61. Findings may represent hemorrhagic products within cyst or cystic neoplasm. Further correlation with enhanced study and/or  urology exam is recommended. 2. No nephrolithiasis. No calcified ureteral calculi. No hydronephrosis or hydroureter. 3. Multiple exophytic lesions bilateral kidney cannot be characterized without IV contrast. Although this may represent cysts some of the lesions are high density. Hemorrhagic or proteinaceous cysts cannot be excluded. 4. No calcified calculi are noted within urinary bladder. 5. Sigmoid colon diverticula are noted. No evidence of acute diverticulitis. 6. Small left hydrocele.   Electronically Signed   By: Lahoma Crocker M.D.   On: 04/12/2014 22:25   Dg Chest Portable 1 View  04/12/2014   CLINICAL DATA:  Left flank pain all day. Kidney problems. Abdominal pain.  EXAM: PORTABLE CHEST - 1 VIEW  COMPARISON:  11/29/2012  FINDINGS: The heart size and mediastinal contours are within normal limits. Both lungs are clear. The visualized skeletal structures are unremarkable.  IMPRESSION: No active disease.   Electronically Signed   By: Lucienne Capers M.D.   On: 04/12/2014 21:08     CBC  Recent Labs Lab 04/12/14 1859 04/13/14 0421 04/14/14 0323  WBC 12.8* 13.6* 16.2*  HGB 10.9* 10.2* 9.7*  HCT 31.3* 29.8* 29.5*  PLT 233 241 217  MCV  86.5 87.9 91.0  MCH 30.1 30.1 29.9  MCHC 34.8 34.2 32.9  RDW 12.9 13.2 13.6  LYMPHSABS 1.1  --   --   MONOABS 0.8  --   --   EOSABS 0.2  --   --   BASOSABS 0.0  --   --     Chemistries   Recent Labs Lab 04/12/14 1859 04/13/14 0421 04/13/14 1344 04/14/14 0323  NA 132* 134*  --  134*  K 5.5* 6.2* 5.4* 5.3  CL 97 100  --  99  CO2 22 22  --  19  GLUCOSE 121* 124*  --  97  BUN 49* 49*  --  55*  CREATININE 2.63* 2.93*  --  3.70*  CALCIUM 9.2 8.3*  --  8.0*  AST 20  --   --   --   ALT 15  --   --   --   ALKPHOS 58  --   --   --   BILITOT 0.2*  --   --   --    ------------------------------------------------------------------------------------------------------------------ estimated creatinine clearance is 18.8 ml/min (by C-G formula based on Cr of 3.7). ------------------------------------------------------------------------------------------------------------------ No results found for this basename: HGBA1C,  in the last 72 hours ------------------------------------------------------------------------------------------------------------------ No results found for this basename: CHOL, HDL, LDLCALC, TRIG, CHOLHDL, LDLDIRECT,  in the last 72 hours ------------------------------------------------------------------------------------------------------------------ No results found for this basename: TSH, T4TOTAL, FREET3, T3FREE, THYROIDAB,  in the last 72 hours ------------------------------------------------------------------------------------------------------------------ No results found for this basename: VITAMINB12, FOLATE, FERRITIN, TIBC, IRON, RETICCTPCT,  in the last 72 hours  Coagulation profile  Recent Labs Lab 04/13/14 0421  INR 1.05    No results found for this basename: DDIMER,  in the last 72 hours  Cardiac Enzymes No results found for this basename: CK, CKMB, TROPONINI, MYOGLOBIN,  in the last 168  hours ------------------------------------------------------------------------------------------------------------------ No components found with this basename: POCBNP,      Time Spent in minutes   35   Rylea Selway K M.D on 04/14/2014 at 12:44 PM  Between 7am to 7pm - Pager - [phone]  After 7pm go to [email or web address] - password [email or web address]  And look for the night coverage person covering for me after hours  [facility] Office  [phone]

## 2014-04-14 NOTE — Progress Notes (Signed)
S:NAEON O:BP 146/55  Pulse 83  Temp(Src) 98.4 F (36.9 C) (Oral)  Resp 18  Ht 5\' 8"  (1.727 m)  Wt 206 lb 1.6 oz (93.486 kg)  BMI 31.34 kg/m2  SpO2 94%  Intake/Output Summary (Last 24 hours) at 04/14/14 0808 Last data filed at 04/14/14 0756  Gross per 24 hour  Intake   1920 ml  Output    870 ml  Net   1050 ml   Intake/Output: I/O last 3 completed shifts: In: 1920 [P.O.:1020; I.V.:900] Out: 670 [Urine:670]  Intake/Output this shift:  Total I/O In: -  Out: 200 [Urine:200] Weight change: 11 lb 1.6 oz (5.035 kg) General appearance: alert, cooperative and no distress  Head: Normocephalic, without obvious abnormality, atraumatic  Resp: wheezes scattered  Cardio: regular rate and rhythm, S1, S2 normal, no murmur, click, rub or gallop  GI: abnormal findings: LUQ and LLQ tenderness, no rebound. Distended, tympanic to percussion. +BS  Extremities: extremities normal, atraumatic, no cyanosis or edema  Pulses: 2+ and symmetric  Neurologic: Grossly normal   Recent Labs Lab 04/12/14 1859 04/13/14 0421 04/13/14 1344 04/14/14 0323  NA 132* 134*  --  134*  K 5.5* 6.2* 5.4* 5.3  CL 97 100  --  99  CO2 22 22  --  19  GLUCOSE 121* 124*  --  97  BUN 49* 49*  --  55*  CREATININE 2.63* 2.93*  --  3.70*  ALBUMIN 4.0  --   --   --   CALCIUM 9.2 8.3*  --  8.0*  AST 20  --   --   --   ALT 15  --   --   --    Liver Function Tests:  Recent Labs Lab 04/12/14 1859  AST 20  ALT 15  ALKPHOS 58  BILITOT 0.2*  PROT 7.3  ALBUMIN 4.0   No results found for this basename: LIPASE, AMYLASE,  in the last 168 hours No results found for this basename: AMMONIA,  in the last 168 hours CBC:  Recent Labs Lab 04/12/14 1859 04/13/14 0421 04/14/14 0323  WBC 12.8* 13.6* 16.2*  NEUTROABS 10.7*  --   --   HGB 10.9* 10.2* 9.7*  HCT 31.3* 29.8* 29.5*  MCV 86.5 87.9 91.0  PLT 233 241 217   Cardiac Enzymes: No results found for this basename: CKTOTAL, CKMB, CKMBINDEX, TROPONINI,  in the  last 168 hours CBG: No results found for this basename: GLUCAP,  in the last 168 hours  Iron Studies: No results found for this basename: IRON, TIBC, TRANSFERRIN, FERRITIN,  in the last 72 hours Studies/Results: Ct Abdomen Pelvis Wo Contrast  04/12/2014   CLINICAL DATA:  Left flank pain all day, no history of kidney stones, stage III renal failure  EXAM: CT ABDOMEN AND PELVIS WITHOUT CONTRAST  TECHNIQUE: Multidetector CT imaging of the abdomen and pelvis was performed following the standard protocol without IV contrast.  COMPARISON:  None.  FINDINGS: Lung bases are unremarkable. The study is limited without IV contrast. Unenhanced liver shows no biliary ductal dilatation.  No calcified gallstones are noted within gallbladder. Unenhanced pancreas spleen and adrenal glands are unremarkable. Extensive atherosclerotic calcifications of abdominal aorta and iliac arteries. No aortic aneurysm.  Unenhanced kidneys shows a lobulated contour. Multiple exophytic lesions are noted bilateral kidneys. This may represent renal cyst but cannot be characterized without IV contrast. A probable cyst in lower pole of the right kidney measures 1.2 cm. Probable cyst in upper pole of the right kidney  measures 1.4 cm. There are few high density exophytic lesions. This may represent hemorrhagic or proteinaceous cyst. The largest in upper pole of the right kidney measures 8 mm. The largest high density lesion in upper pole of the left kidney measures 8 mm.  There is a central parapelvic cyst in midpole of the left kidney with peripheral high-density material. This measures 3.4 x 2.8 cm. Some of the high density material is extending in central calyces of the lower pole of the left kidney as seen in axial image 61. Findings may represent hemorrhagic products within cyst or cystic neoplasm. Further correlation with enhanced study and/or urology exam is recommended.  No nephrolithiasis. No calcified ureteral calculi. Oral contrast  material was given to the patient. No small bowel or colonic obstruction. Stool noted in right colon and transverse colon. There is no pericecal inflammation. Sigmoid colon diverticula are noted. No evidence of acute diverticulitis. Prostate gland and seminal vesicles are unremarkable. No calcified calculi are noted within urinary bladder. There is a small left hydrocele.  IMPRESSION: 1. Limited study without IV contrast. There is a central parapelvic cyst in midpole of the left kidney with peripheral high-density material. This measures 3.4 x 2.8 cm. Some of the high density material is extending in central calyces of the lower pole of the left kidney as seen in axial image 61. Findings may represent hemorrhagic products within cyst or cystic neoplasm. Further correlation with enhanced study and/or urology exam is recommended. 2. No nephrolithiasis. No calcified ureteral calculi. No hydronephrosis or hydroureter. 3. Multiple exophytic lesions bilateral kidney cannot be characterized without IV contrast. Although this may represent cysts some of the lesions are high density. Hemorrhagic or proteinaceous cysts cannot be excluded. 4. No calcified calculi are noted within urinary bladder. 5. Sigmoid colon diverticula are noted. No evidence of acute diverticulitis. 6. Small left hydrocele.   Electronically Signed   By: Lahoma Crocker M.D.   On: 04/12/2014 22:25   US Renal  04/13/2014   CLINICAL DATA:  Acute renal failure.  EXAM: RENAL/URINARY TRACT ULTRASOUND COMPLETE  COMPARISON:  CT scan of April 12, 2014.  FINDINGS: Right Kidney:  Length: 11.5 cm. Three rounded hypoechoic abnormalities are noted most consistent with cyst. The largest measures 1.8 cm. Echogenicity within normal limits. No mass or hydronephrosis visualized.  Left Kidney:  Length: 12.9 cm. Echogenicity within normal limits. Hypoechoic parapelvic abnormality is noted which measures 3.7 x 2.7 cm. It is not clearly a cyst by ultrasound criteria, and  therefore further evaluation with MRI or CT with contrast is recommended to rule out mass. No hydronephrosis visualized.  Bladder:  Limited visualization as patient was not cooperative with the exam.  IMPRESSION: Right renal cysts are noted.  3.7 cm parapelvic rounded abnormality is noted which is not clearly a cyst by ultrasound criteria. Further evaluation with MRI or CT scan with intravenous contrast is recommended to evaluate for possible neoplasm or malignancy.   Electronically Signed   By: Sabino Dick M.D.   On: 04/13/2014 15:34   Dg Chest Portable 1 View  04/12/2014   CLINICAL DATA:  Left flank pain all day. Kidney problems. Abdominal pain.  EXAM: PORTABLE CHEST - 1 VIEW  COMPARISON:  11/29/2012  FINDINGS: The heart size and mediastinal contours are within normal limits. Both lungs are clear. The visualized skeletal structures are unremarkable.  IMPRESSION: No active disease.   Electronically Signed   By: Lucienne Capers M.D.   On: 04/12/2014 21:08   . amLODipine  10 mg Oral q morning - 10a  . atorvastatin  10 mg Oral q1800  . doxazosin  4 mg Oral Daily  . metoprolol  100 mg Oral Daily  . mometasone-formoterol  2 puff Inhalation BID  . morphine  15 mg Oral Q12H  . tiotropium  18 mcg Inhalation Daily    Assessment/Plan:  Left flank pain: ruptured hemorrhagic renal cyst. UA with large hgb.  1. Ruptured hemorrhagic renal cyst - urology consulted and will pursue cystoscopy and pyelography today, likely benign but small chance of malignancy per uro 2. Acute on CKD: baseline Scr 2.1-2.3. Bumped further to 3.7 today, likely secondary to above, will continue to follow. 3. Hyperkalemia: resolved 4. Hypertension: stable 5. COPD: per primary    Adamo, Elena  Pt seen, examined and agree w A/P as above.  Kelly Splinter MD pager [phone]    cell [phone] 04/14/2014, 5:35 PM

## 2014-04-15 LAB — CBC
HEMATOCRIT: 26 % — AB (ref 39.0–52.0)
Hemoglobin: 8.6 g/dL — ABNORMAL LOW (ref 13.0–17.0)
MCH: 29.4 pg (ref 26.0–34.0)
MCHC: 33.1 g/dL (ref 30.0–36.0)
MCV: 88.7 fL (ref 78.0–100.0)
Platelets: 199 10*3/uL (ref 150–400)
RBC: 2.93 MIL/uL — ABNORMAL LOW (ref 4.22–5.81)
RDW: 13.4 % (ref 11.5–15.5)
WBC: 14 10*3/uL — AB (ref 4.0–10.5)

## 2014-04-15 LAB — BASIC METABOLIC PANEL
Anion gap: 15 (ref 5–15)
BUN: 56 mg/dL — AB (ref 6–23)
CALCIUM: 7.5 mg/dL — AB (ref 8.4–10.5)
CO2: 19 mEq/L (ref 19–32)
CREATININE: 3.92 mg/dL — AB (ref 0.50–1.35)
Chloride: 96 mEq/L (ref 96–112)
GFR calc Af Amer: 16 mL/min — ABNORMAL LOW (ref >90)
GFR calc non Af Amer: 14 mL/min — ABNORMAL LOW (ref >90)
GLUCOSE: 79 mg/dL (ref 70–99)
Potassium: 4.7 mEq/L (ref 3.7–5.3)
SODIUM: 130 meq/L — AB (ref 137–147)

## 2014-04-15 MED ORDER — NALOXONE HCL 0.4 MG/ML IJ SOLN
0.2000 mg | Freq: Once | INTRAMUSCULAR | Status: AC
  Administered 2014-04-15: 0.2 mg via INTRAVENOUS

## 2014-04-15 NOTE — Progress Notes (Signed)
Patient ID: Terry [name], male   DOB: [birth year], 79 y.o.   MRN: [id number]    Subjective:  Pt with increased pain yesterday in lower abdomen.  He had denied significant flank or upper abdominal pain.  He did have an episode of gross hematuria last night although his urine subsequently cleared. Currently, he denies pain.  He subjectively feels he is emptying his bladder well.  Objective: Vital signs in last 24 hours: Temp:  [97.9 F (36.6 C)-98.2 F (36.8 C)] 97.9 F (36.6 C) (10/25 0637) Pulse Rate:  [63-85] 85 (10/25 0637) Resp:  [15-19] 19 (10/25 0637) BP: (136-160)/(50-60) 143/54 mmHg (10/25 0637) SpO2:  [92 %-94 %] 92 % (10/25 0637)  Intake/Output from previous day: 10/24 0701 - 10/25 0700 In: 1861 [P.O.:536; I.V.:1325] Out: 856 [Urine:856] Intake/Output this shift:    Physical Exam:  General: Alert and oriented Abdomen: Soft, ND, No CVAT, Abdomen non tender  Lab Results:  Recent Labs  04/14/14 0323 04/14/14 1507 04/15/14 0350  HGB 9.7* 9.2* 8.6*  HCT 29.5* 27.3* 26.0*   BMET  Recent Labs  04/14/14 0323 04/15/14 0350  NA 134* 130*  K 5.3 4.7  CL 99 96  CO2 19 19  GLUCOSE 97 79  BUN 55* 56*  CREATININE 3.70* 3.92*  CALCIUM 8.0* 7.5*     Studies/Results: Ct Abdomen Pelvis Wo Contrast  04/14/2014   CLINICAL DATA:  Ultrasound showed a complicated cyst on the right for which follow-up is recommended. Patient also has abdominal pain.  EXAM: CT ABDOMEN AND PELVIS WITHOUT CONTRAST  TECHNIQUE: Multidetector CT imaging of the abdomen and pelvis was performed following the standard protocol without IV contrast.  COMPARISON:  Renal ultrasound, 04/13/2014. Abdominal CT, 04/12/2014.  FINDINGS: The presumed renal sinus cyst noted on the prior CT, arising from the left kidney, is now hyper attenuating with average Hounsfield units of 58. This is consistent with acute hemorrhage. Small focus of hyperattenuation is noted associated with a small mass in the left kidney  upper pole, likely a small hemorrhagic cyst. This is stable. There is new perinephric stranding on the left extending along the proximal left ureter. The ureter is normal course and caliber with no stones. Multiple small bilateral renal masses are noted. Several show increased attenuation. These are likely combination of simple and hemorrhagic cysts. These are similar to the prior CT.  There is lung base opacity mostly dependently in the lower lobes, right greater than left, consistent with atelectasis, mildly increased from the prior study.  Small low-density lesions in noted near the dome of the left liver lobe, stable, likely cysts. Liver otherwise unremarkable.  Small splenic calcifications consistent healed granuloma. Spleen otherwise normal.  Gallbladder and pancreas are unremarkable. No bile duct dilation. No adrenal mass.  Bladder is unremarkable.  Dense calcification is noted along mildly ectatic abdominal aorta and its branch vessels.  No pathologically enlarged lymph nodes. There are scattered prominent gastrohepatic ligament and retroperitoneal lymph nodes.  There are numerous colonic diverticula most prominent along the sigmoid colon. Small area of inflammatory type changes noted in the fat along the superior margin of the lower sigmoid colon adjacent to several diverticula. This is also adjacent to the distal right ureter. This is most likely related to the left renal pathology. Colon otherwise unremarkable. Normal small bowel.  Bony structures are diffusely demineralized. There are degenerative changes of the visualized spine. No osteoblastic or osteolytic lesions.  IMPRESSION: 1. Since the prior CT, the presumed left renal sinus cyst has  hemorrhaged. It is now hyper attenuating consistent with acute blood. The cause of the hemorrhage is unclear. There is associated perinephric stranding extending along the left ureter. The left ureter is normal in course and in caliber. There is no evidence of  obstructive uropathy. 2. No other convincing acute findings. 3. Other renal masses seen bilaterally appear to be a combination of simple cysts and cyst complicated by previous hemorrhage. These lesions are not fully characterized on the unenhanced CT imaging performed currently and previously or on the ultrasound. More comprehensive characterization of these lesions would be best performed with renal MRI with and without contrast if the patient can tolerate that procedure. 4. Multiple chronic findings as described.   Electronically Signed   By: Lajean Manes M.D.   On: 04/14/2014 11:50    Assessment/Plan: 1) Hematuria/left complex renal cyst: I reviewed CT from yesterday.  This demonstrates expected radiologic progression of what appears to be a hemorrhagic renal cyst.  There is no evidence of obstruction. Do not suspect any clinical significant acute bleeding at this point.  Will plan to proceed with endoscopic evaluation as planned in about 1-2 weeks as outpatient to completely evaluate hematuria.   2) AKI: There is no urologic explanation for worsening renal function. No evidence of obstruction and patient has remained hemodynamically stable.  I do not think his hemorrhagic cyst is a cause for his AKI.    LOS: 3 days   Samah Lapiana,LES 04/15/2014, 7:51 AM

## 2014-04-15 NOTE — Progress Notes (Signed)
Patient ID: Terry [name], male    DOB: 01-07-1935, 79 y.o.   MRN: [id number] Nephrology Progress Note  S: Feeling better, very mild pain currently. Breathing is okay. He does not use O2 at home.  O:BP 143/54  Pulse 85  Temp(Src) 97.9 F (36.6 C) (Oral)  Resp 19  Ht 5\' 8"  (1.727 m)  Wt 206 lb 1.6 oz (93.486 kg)  BMI 31.34 kg/m2  SpO2 92%  Intake/Output Summary (Last 24 hours) at 04/15/14 0820 Last data filed at 04/15/14 0610  Gross per 24 hour  Intake   1861 ml  Output    656 ml  Net   1205 ml   Intake/Output: I/O last 3 completed shifts: In: 2761 [P.O.:536; I.V.:2225] Out: 1326 [Urine:1326]  Intake/Output this shift:    Weight change:  General: NAD  Resp: wheezes scattered  Cardio: RRR, normal s1/s2, no mrg GI: mild LUQ and LLQ tenderness, no rebound. Distended, tympanic to percussion. +BS  Extremities: no edema or cyanosis Neurologic: alert and oriented, grossly normal   Recent Labs Lab 04/12/14 1859 04/13/14 0421 04/13/14 1344 04/14/14 0323 04/15/14 0350  NA 132* 134*  --  134* 130*  K 5.5* 6.2* 5.4* 5.3 4.7  CL 97 100  --  99 96  CO2 22 22  --  19 19  GLUCOSE 121* 124*  --  97 79  BUN 49* 49*  --  55* 56*  CREATININE 2.63* 2.93*  --  3.70* 3.92*  ALBUMIN 4.0  --   --   --   --   CALCIUM 9.2 8.3*  --  8.0* 7.5*  AST 20  --   --   --   --   ALT 15  --   --   --   --    Liver Function Tests:  Recent Labs Lab 04/12/14 1859  AST 20  ALT 15  ALKPHOS 58  BILITOT 0.2*  PROT 7.3  ALBUMIN 4.0   No results found for this basename: LIPASE, AMYLASE,  in the last 168 hours No results found for this basename: AMMONIA,  in the last 168 hours CBC:  Recent Labs Lab 04/12/14 1859 04/13/14 0421 04/14/14 0323 04/14/14 1507 04/15/14 0350  WBC 12.8* 13.6* 16.2*  --  14.0*  NEUTROABS 10.7*  --   --   --   --   HGB 10.9* 10.2* 9.7* 9.2* 8.6*  HCT 31.3* 29.8* 29.5* 27.3* 26.0*  MCV 86.5 87.9 91.0  --  88.7  PLT 233 241 217  --  199   Cardiac Enzymes: No  results found for this basename: CKTOTAL, CKMB, CKMBINDEX, TROPONINI,  in the last 168 hours CBG: No results found for this basename: GLUCAP,  in the last 168 hours  Iron Studies: No results found for this basename: IRON, TIBC, TRANSFERRIN, FERRITIN,  in the last 72 hours Studies/Results: Ct Abdomen Pelvis Wo Contrast  04/14/2014   CLINICAL DATA:  Ultrasound showed a complicated cyst on the right for which follow-up is recommended. Patient also has abdominal pain.  EXAM: CT ABDOMEN AND PELVIS WITHOUT CONTRAST  TECHNIQUE: Multidetector CT imaging of the abdomen and pelvis was performed following the standard protocol without IV contrast.  COMPARISON:  Renal ultrasound, 04/13/2014. Abdominal CT, 04/12/2014.  FINDINGS: The presumed renal sinus cyst noted on the prior CT, arising from the left kidney, is now hyper attenuating with average Hounsfield units of 58. This is consistent with acute hemorrhage. Small focus of hyperattenuation is noted associated with a small  mass in the left kidney upper pole, likely a small hemorrhagic cyst. This is stable. There is new perinephric stranding on the left extending along the proximal left ureter. The ureter is normal course and caliber with no stones. Multiple small bilateral renal masses are noted. Several show increased attenuation. These are likely combination of simple and hemorrhagic cysts. These are similar to the prior CT.  There is lung base opacity mostly dependently in the lower lobes, right greater than left, consistent with atelectasis, mildly increased from the prior study.  Small low-density lesions in noted near the dome of the left liver lobe, stable, likely cysts. Liver otherwise unremarkable.  Small splenic calcifications consistent healed granuloma. Spleen otherwise normal.  Gallbladder and pancreas are unremarkable. No bile duct dilation. No adrenal mass.  Bladder is unremarkable.  Dense calcification is noted along mildly ectatic abdominal aorta and  its branch vessels.  No pathologically enlarged lymph nodes. There are scattered prominent gastrohepatic ligament and retroperitoneal lymph nodes.  There are numerous colonic diverticula most prominent along the sigmoid colon. Small area of inflammatory type changes noted in the fat along the superior margin of the lower sigmoid colon adjacent to several diverticula. This is also adjacent to the distal right ureter. This is most likely related to the left renal pathology. Colon otherwise unremarkable. Normal small bowel.  Bony structures are diffusely demineralized. There are degenerative changes of the visualized spine. No osteoblastic or osteolytic lesions.  IMPRESSION: 1. Since the prior CT, the presumed left renal sinus cyst has hemorrhaged. It is now hyper attenuating consistent with acute blood. The cause of the hemorrhage is unclear. There is associated perinephric stranding extending along the left ureter. The left ureter is normal in course and in caliber. There is no evidence of obstructive uropathy. 2. No other convincing acute findings. 3. Other renal masses seen bilaterally appear to be a combination of simple cysts and cyst complicated by previous hemorrhage. These lesions are not fully characterized on the unenhanced CT imaging performed currently and previously or on the ultrasound. More comprehensive characterization of these lesions would be best performed with renal MRI with and without contrast if the patient can tolerate that procedure. 4. Multiple chronic findings as described.   Electronically Signed   By: Lajean Manes M.D.   On: 04/14/2014 11:50   Dg Chest 2 View  04/14/2014   CLINICAL DATA:  Cough. History of smoking in the past. History of hypertension and COPD. History of coronary artery disease.  EXAM: CHEST  2 VIEW  COMPARISON:  04/12/2014.  FINDINGS: Cardiac silhouette is normal in size. Normal mediastinal and hilar contours.  Clear lungs.  No pleural effusion or pneumothorax.   Bony thorax is demineralized but intact.  IMPRESSION: No acute cardiopulmonary disease.   Electronically Signed   By: Lajean Manes M.D.   On: 04/14/2014 11:19   US Renal  04/13/2014   CLINICAL DATA:  Acute renal failure.  EXAM: RENAL/URINARY TRACT ULTRASOUND COMPLETE  COMPARISON:  CT scan of April 12, 2014.  FINDINGS: Right Kidney:  Length: 11.5 cm. Three rounded hypoechoic abnormalities are noted most consistent with cyst. The largest measures 1.8 cm. Echogenicity within normal limits. No mass or hydronephrosis visualized.  Left Kidney:  Length: 12.9 cm. Echogenicity within normal limits. Hypoechoic parapelvic abnormality is noted which measures 3.7 x 2.7 cm. It is not clearly a cyst by ultrasound criteria, and therefore further evaluation with MRI or CT with contrast is recommended to rule out mass. No hydronephrosis visualized.  Bladder:  Limited visualization as patient was not cooperative with the exam.  IMPRESSION: Right renal cysts are noted.  3.7 cm parapelvic rounded abnormality is noted which is not clearly a cyst by ultrasound criteria. Further evaluation with MRI or CT scan with intravenous contrast is recommended to evaluate for possible neoplasm or malignancy.   Electronically Signed   By: Sabino Dick M.D.   On: 04/13/2014 15:34   . amLODipine  10 mg Oral q morning - 10a  . atorvastatin  10 mg Oral q1800  . bisacodyl  10 mg Rectal Daily  . cefTRIAXone (ROCEPHIN)  IV  1 g Intravenous Q24H  . docusate sodium  200 mg Oral BID  . doxazosin  4 mg Oral Daily  . fluticasone  1 spray Each Nare Daily  . metoprolol  100 mg Oral Daily  . mometasone-formoterol  2 puff Inhalation BID  . morphine  15 mg Oral Q12H  . polyethylene glycol  17 g Oral Daily  . tiotropium  18 mcg Inhalation Daily    Assessment/Plan:  Left flank pain: ruptured hemorrhagic renal cyst. UA with large hgb.  1. Ruptured hemorrhagic renal cyst - urology following with planned outpt cystoscopy. Repeat CT yesterday  showing left renal sinus cyst with hemorrhage without evidence of obstruction. Urology plans for outpt cystoscopy.  2. Acute on CKD: baseline Scr 2.1-2.3, continues to trend upward now 3.92. Most likely explanation would be the above cyst, hemorrhage causing vasospasm or some parenchymal sloughing on top of already diseased kidneys. Anticipate the Scr to get better with time, though may continue to increase acutely. He appears to be euvolemic, so okay to stop fluids for now. 3. Hyponatremia: baseline 130, stable 4. Hyperkalemia: resolved 5. Hypertension: stable 6. COPD: per primary  Tawanna Sat 8:20 AM  Renal Attending: Hemorrhagic painful cyst.  CKD with AKI Will plan to stop IVFs and follow for now.  Intraparenchymal injury superimposed upon CKD is probably good enough reason to explain the AKI.  Will cont support. Orilla Templeman C

## 2014-04-15 NOTE — Progress Notes (Signed)
Discussed pt condition with Dr. Candiss Norse and orders received.  Will give Narcan 0.2 and DC MS Contin.  Explained to wife.

## 2014-04-15 NOTE — Progress Notes (Signed)
Pt woke up after Narcan and stated, "I need to go to the bathroom."  Walked into the bathroom and urinated into the toilet and stated that he passed some gas and had a BM.  Abd still tight.  O2 sat almost immediately went up to 94% on 4L so turned it down to 3L.

## 2014-04-15 NOTE — Progress Notes (Signed)
RN called d/t ? Mental status after morphine, and increased WOB.  Upon entering room, noted increased WOB.  Per pt wife- pt has increased WOB at home sometimes, per wife: this is not new.  BBSH diminished t/o w/ upper airway throat wheeze sound.  PRN breathing tx given.  Pt denies SOB, pt is awake, alert and oriented, pt follows commands but does appear nod asleep briefly and then wakes up on his own.  RN and RT covering floor aware.

## 2014-04-15 NOTE — Progress Notes (Signed)
Urine clearing through the night per the night nurse.  O2 up to 3L N/C with continuous pulse ox with sats 91/92%.  Lungs sound tight, diminished.  Abd is still distended, tight, firm.  Pt reports did pass some gas yesterday but did not have a BM.  Requesting solid food.  Telemetry is Sinus rhythm at rate of 88 at present.  Na is 130.Hbg 8.6.  Bun/Creat increased from yesterday.

## 2014-04-15 NOTE — Progress Notes (Signed)
Patient Demographics  Terry [name], is a 79 y.o. male, DOB - 05/20/1935, [id number]  Admit date - 04/12/2014   Admitting Physician Terry Costa, MD  Outpatient Primary MD for the patient is Terry Kern., DO  LOS - 3   Chief Complaint  Patient presents with  . Back Pain  . Flank Pain        Subjective:   [name] has, No headache, No chest pain, improved left-sided flank pain  - No Nausea, No new weakness tingling or numbness, No Cough - SOB.    Assessment & Plan    1. Left flank pain -  due to hemorrhage and a renal cyst repeat CT confirms, Urology following, D/W Dr Alinda Money on 04-14-14, likely cystoscopy outpatient, will continue to follow closely his symptoms and renal function. CT showed some perinephric stranding on the left side, had ++ leukocytosis improving on empiric IV Rocephin, follow clinically, Uro and Renal on board.    2. Acute renal failure on top of chronic disease stage IV. Baseline creatinine around 2. Has certainly worsened, avoid nephrotoxins, noted urine electrolytes. IVF per Renal, Renal following.    3. Hyponatremia -  Per renal.    4. Hypertension. Stable on beta blocker and Norvasc continue.    5. CAD. No acute issue. Continue beta blocker, hold aspirin for now.    6.Constipation. Place on bowel regimen and monitor.    Code Status: Full  Family Communication:wife  Disposition Plan: home   Procedures CT scan abdomen pelvis, renal ultrasound   Consults nephrology, I discussed his case with urologist on call Dr. Karsten Ro personally currently nothing to offer.   Medications  Scheduled Meds: . amLODipine  10 mg Oral q morning - 10a  . atorvastatin  10 mg Oral q1800  . bisacodyl  10 mg Rectal Daily  . cefTRIAXone (ROCEPHIN)  IV  1 g  Intravenous Q24H  . docusate sodium  200 mg Oral BID  . doxazosin  4 mg Oral Daily  . fluticasone  1 spray Each Nare Daily  . metoprolol  100 mg Oral Daily  . mometasone-formoterol  2 puff Inhalation BID  . morphine  15 mg Oral Q12H  . polyethylene glycol  17 g Oral Daily  . tiotropium  18 mcg Inhalation Daily   Continuous Infusions:   PRN Meds:.albuterol, morphine injection, naLOXone (NARCAN)  injection, ondansetron (ZOFRAN) IV  DVT Prophylaxis SCDs    Lab Results  Component Value Date   PLT 199 04/15/2014    Antibiotics     Anti-infectives   Start     Dose/Rate Route Frequency Ordered Stop   04/14/14 1300  cefTRIAXone (ROCEPHIN) 1 g in dextrose 5 % 50 mL IVPB     1 g 100 mL/hr over 30 Minutes Intravenous Every 24 hours 04/14/14 1158            Objective:   Filed Vitals:   04/14/14 1343 04/14/14 1728 04/14/14 2225 04/15/14 0637  BP: 147/60 136/50 160/55 143/54  Pulse: 63 69 69 85  Temp: 98.2 F (36.8 C) 98 F (36.7 C) 98 F (36.7 C) 97.9 F (36.6 C)  TempSrc: Oral Oral Oral   Resp: 17 16 15 19   Height:  Weight:      SpO2: 94% 93% 93% 92%    Wt Readings from Last 3 Encounters:  04/14/14 93.486 kg (206 lb 1.6 oz)  12/01/13 88.905 kg (196 lb)  11/02/13 88.451 kg (195 lb)     Intake/Output Summary (Last 24 hours) at 04/15/14 0909 Last data filed at 04/15/14 0853  Gross per 24 hour  Intake   1685 ml  Output    610 ml  Net   1075 ml     Physical Exam  Awake Alert, Oriented X 3, No new F.N deficits, Normal affect [place].AT,PERRAL Supple Neck,No JVD, No cervical lymphadenopathy appriciated.  Symmetrical Chest wall movement, Good air movement bilaterally, CTAB RRR,No Gallops,Rubs or new Murmurs, No Parasternal Heave +ve B.Sounds, Abd mildly distended, +ve L flank tenderness, No organomegaly appriciated, No rebound - guarding or rigidity. No Cyanosis, Clubbing or edema, No new Rash or bruise     Data Review   Micro Results No results found for  this or any previous visit (from the past 240 hour(s)).  Radiology Reports Ct Abdomen Pelvis Wo Contrast  04/12/2014   CLINICAL DATA:  Left flank pain all day, no history of kidney stones, stage III renal failure  EXAM: CT ABDOMEN AND PELVIS WITHOUT CONTRAST  TECHNIQUE: Multidetector CT imaging of the abdomen and pelvis was performed following the standard protocol without IV contrast.  COMPARISON:  None.  FINDINGS: Lung bases are unremarkable. The study is limited without IV contrast. Unenhanced liver shows no biliary ductal dilatation.  No calcified gallstones are noted within gallbladder. Unenhanced pancreas spleen and adrenal glands are unremarkable. Extensive atherosclerotic calcifications of abdominal aorta and iliac arteries. No aortic aneurysm.  Unenhanced kidneys shows a lobulated contour. Multiple exophytic lesions are noted bilateral kidneys. This may represent renal cyst but cannot be characterized without IV contrast. A probable cyst in lower pole of the right kidney measures 1.2 cm. Probable cyst in upper pole of the right kidney measures 1.4 cm. There are few high density exophytic lesions. This may represent hemorrhagic or proteinaceous cyst. The largest in upper pole of the right kidney measures 8 mm. The largest high density lesion in upper pole of the left kidney measures 8 mm.  There is a central parapelvic cyst in midpole of the left kidney with peripheral high-density material. This measures 3.4 x 2.8 cm. Some of the high density material is extending in central calyces of the lower pole of the left kidney as seen in axial image 61. Findings may represent hemorrhagic products within cyst or cystic neoplasm. Further correlation with enhanced study and/or urology exam is recommended.  No nephrolithiasis. No calcified ureteral calculi. Oral contrast material was given to the patient. No small bowel or colonic obstruction. Stool noted in right colon and transverse colon. There is no pericecal  inflammation. Sigmoid colon diverticula are noted. No evidence of acute diverticulitis. Prostate gland and seminal vesicles are unremarkable. No calcified calculi are noted within urinary bladder. There is a small left hydrocele.  IMPRESSION: 1. Limited study without IV contrast. There is a central parapelvic cyst in midpole of the left kidney with peripheral high-density material. This measures 3.4 x 2.8 cm. Some of the high density material is extending in central calyces of the lower pole of the left kidney as seen in axial image 61. Findings may represent hemorrhagic products within cyst or cystic neoplasm. Further correlation with enhanced study and/or urology exam is recommended. 2. No nephrolithiasis. No calcified ureteral calculi. No hydronephrosis or hydroureter. 3.  Multiple exophytic lesions bilateral kidney cannot be characterized without IV contrast. Although this may represent cysts some of the lesions are high density. Hemorrhagic or proteinaceous cysts cannot be excluded. 4. No calcified calculi are noted within urinary bladder. 5. Sigmoid colon diverticula are noted. No evidence of acute diverticulitis. 6. Small left hydrocele.   Electronically Signed   By: Lahoma Crocker M.D.   On: 04/12/2014 22:25   Dg Chest Portable 1 View  04/12/2014   CLINICAL DATA:  Left flank pain all day. Kidney problems. Abdominal pain.  EXAM: PORTABLE CHEST - 1 VIEW  COMPARISON:  11/29/2012  FINDINGS: The heart size and mediastinal contours are within normal limits. Both lungs are clear. The visualized skeletal structures are unremarkable.  IMPRESSION: No active disease.   Electronically Signed   By: Lucienne Capers M.D.   On: 04/12/2014 21:08     CBC  Recent Labs Lab 04/12/14 1859 04/13/14 0421 04/14/14 0323 04/14/14 1507 04/15/14 0350  WBC 12.8* 13.6* 16.2*  --  14.0*  HGB 10.9* 10.2* 9.7* 9.2* 8.6*  HCT 31.3* 29.8* 29.5* 27.3* 26.0*  PLT 233 241 217  --  199  MCV 86.5 87.9 91.0  --  88.7  MCH 30.1 30.1  29.9  --  29.4  MCHC 34.8 34.2 32.9  --  33.1  RDW 12.9 13.2 13.6  --  13.4  LYMPHSABS 1.1  --   --   --   --   MONOABS 0.8  --   --   --   --   EOSABS 0.2  --   --   --   --   BASOSABS 0.0  --   --   --   --     Chemistries   Recent Labs Lab 04/12/14 1859 04/13/14 0421 04/13/14 1344 04/14/14 0323 04/15/14 0350  NA 132* 134*  --  134* 130*  K 5.5* 6.2* 5.4* 5.3 4.7  CL 97 100  --  99 96  CO2 22 22  --  19 19  GLUCOSE 121* 124*  --  97 79  BUN 49* 49*  --  55* 56*  CREATININE 2.63* 2.93*  --  3.70* 3.92*  CALCIUM 9.2 8.3*  --  8.0* 7.5*  AST 20  --   --   --   --   ALT 15  --   --   --   --   ALKPHOS 58  --   --   --   --   BILITOT 0.2*  --   --   --   --    ------------------------------------------------------------------------------------------------------------------ estimated creatinine clearance is 17.8 ml/min (by C-G formula based on Cr of 3.92). ------------------------------------------------------------------------------------------------------------------ No results found for this basename: HGBA1C,  in the last 72 hours ------------------------------------------------------------------------------------------------------------------ No results found for this basename: CHOL, HDL, LDLCALC, TRIG, CHOLHDL, LDLDIRECT,  in the last 72 hours ------------------------------------------------------------------------------------------------------------------ No results found for this basename: TSH, T4TOTAL, FREET3, T3FREE, THYROIDAB,  in the last 72 hours ------------------------------------------------------------------------------------------------------------------ No results found for this basename: VITAMINB12, FOLATE, FERRITIN, TIBC, IRON, RETICCTPCT,  in the last 72 hours  Coagulation profile  Recent Labs Lab 04/13/14 0421  INR 1.05    No results found for this basename: DDIMER,  in the last 72 hours  Cardiac Enzymes No results found for this basename: CK,  CKMB, TROPONINI, MYOGLOBIN,  in the last 168 hours ------------------------------------------------------------------------------------------------------------------ No components found with this basename: POCBNP,      Time Spent in minutes   35  Thurnell Lose M.D on 04/15/2014 at 9:09 AM  Between 7am to 7pm - Pager - [phone]  After 7pm go to [email or web address] - password [email or web address]  And look for the night coverage person covering for me after hours  [facility] Office  [phone]

## 2014-04-15 NOTE — Progress Notes (Signed)
O2 sats are in the high 80s at present.  Pupils are constricted.  ? Morphine not clearing system due to elevated BUN/Creat.  Called Respiratory to come assess pt resp status.  Wife informed.

## 2014-04-15 NOTE — Progress Notes (Signed)
Pt ambulated in the hallway approximately 40 ft with rolling walker and assistance of 2 on the side.  Pt very short of breath with the exertion of walking.  Pt had a BM today, urine is now clear, yellow.

## 2014-04-16 ENCOUNTER — Inpatient Hospital Stay (HOSPITAL_COMMUNITY): Payer: Medicare PPO

## 2014-04-16 DIAGNOSIS — R1030 Lower abdominal pain, unspecified: Secondary | ICD-10-CM

## 2014-04-16 LAB — BASIC METABOLIC PANEL
Anion gap: 18 — ABNORMAL HIGH (ref 5–15)
BUN: 68 mg/dL — AB (ref 6–23)
CALCIUM: 7.7 mg/dL — AB (ref 8.4–10.5)
CO2: 17 mEq/L — ABNORMAL LOW (ref 19–32)
CREATININE: 4.25 mg/dL — AB (ref 0.50–1.35)
Chloride: 93 mEq/L — ABNORMAL LOW (ref 96–112)
GFR calc non Af Amer: 12 mL/min — ABNORMAL LOW (ref >90)
GFR, EST AFRICAN AMERICAN: 14 mL/min — AB (ref >90)
Glucose, Bld: 106 mg/dL — ABNORMAL HIGH (ref 70–99)
Potassium: 4.4 mEq/L (ref 3.7–5.3)
Sodium: 128 mEq/L — ABNORMAL LOW (ref 137–147)

## 2014-04-16 LAB — URINALYSIS, ROUTINE W REFLEX MICROSCOPIC
BILIRUBIN URINE: NEGATIVE
GLUCOSE, UA: NEGATIVE mg/dL
Ketones, ur: NEGATIVE mg/dL
Leukocytes, UA: NEGATIVE
Nitrite: NEGATIVE
Protein, ur: 100 mg/dL — AB
SPECIFIC GRAVITY, URINE: 1.013 (ref 1.005–1.030)
UROBILINOGEN UA: 0.2 mg/dL (ref 0.0–1.0)
pH: 5 (ref 5.0–8.0)

## 2014-04-16 LAB — CBC
HEMATOCRIT: 25.7 % — AB (ref 39.0–52.0)
Hemoglobin: 8.7 g/dL — ABNORMAL LOW (ref 13.0–17.0)
MCH: 30.3 pg (ref 26.0–34.0)
MCHC: 33.9 g/dL (ref 30.0–36.0)
MCV: 89.5 fL (ref 78.0–100.0)
Platelets: 209 10*3/uL (ref 150–400)
RBC: 2.87 MIL/uL — ABNORMAL LOW (ref 4.22–5.81)
RDW: 13.3 % (ref 11.5–15.5)
WBC: 11.1 10*3/uL — ABNORMAL HIGH (ref 4.0–10.5)

## 2014-04-16 LAB — SODIUM, URINE, RANDOM

## 2014-04-16 LAB — URINE MICROSCOPIC-ADD ON

## 2014-04-16 LAB — PROTEIN / CREATININE RATIO, URINE
Creatinine, Urine: 121.24 mg/dL
Protein Creatinine Ratio: 1.12 — ABNORMAL HIGH (ref 0.00–0.15)
Total Protein, Urine: 135.6 mg/dL

## 2014-04-16 LAB — OSMOLALITY: Osmolality: 290 mOsm/kg (ref 275–300)

## 2014-04-16 MED ORDER — FUROSEMIDE 10 MG/ML IJ SOLN
20.0000 mg | Freq: Once | INTRAMUSCULAR | Status: AC
  Administered 2014-04-16: 20 mg via INTRAVENOUS
  Filled 2014-04-16 (×2): qty 2

## 2014-04-16 MED ORDER — METHYLPREDNISOLONE SODIUM SUCC 125 MG IJ SOLR
60.0000 mg | Freq: Two times a day (BID) | INTRAMUSCULAR | Status: DC
  Administered 2014-04-16 – 2014-04-19 (×8): 60 mg via INTRAVENOUS
  Filled 2014-04-16: qty 0.96
  Filled 2014-04-16: qty 2
  Filled 2014-04-16 (×3): qty 0.96
  Filled 2014-04-16 (×2): qty 2
  Filled 2014-04-16 (×2): qty 0.96
  Filled 2014-04-16: qty 2
  Filled 2014-04-16 (×5): qty 0.96

## 2014-04-16 MED ORDER — ALBUTEROL SULFATE (2.5 MG/3ML) 0.083% IN NEBU: 3.0000 mL | INHALATION_SOLUTION | RESPIRATORY_TRACT | Status: DC | PRN

## 2014-04-16 NOTE — Progress Notes (Signed)
Patient ID: Terry [name], male    DOB: [birth year], 79 y.o.   MRN: [id number] Nephrology Progress Note  S: Breathing more difficult this morning  O:BP 156/56  Pulse 83  Temp(Src) 98.2 F (36.8 C) (Oral)  Resp 20  Ht 5\' 8"  (1.727 m)  Wt 225 lb 8.5 oz (102.3 kg)  BMI 34.30 kg/m2  SpO2 91%  Intake/Output Summary (Last 24 hours) at 04/16/14 0820 Last data filed at 04/15/14 1703  Gross per 24 hour  Intake   1860 ml  Output      0 ml  Net   1860 ml   Intake/Output: I/O last 3 completed shifts: In: 3185 [P.O.:960; I.V.:1325; Other:800; IV Piggyback:100] Out: 400 [Urine:400]  Intake/Output this shift:    Weight change:  General: mild distress and speaks in short sentences Resp: wheezes scattered, forced expiration with moderate appearing effort. [place] in place.  Cardio: RRR, normal s1/s2, no mrg GI: Distended, tympanic to percussion. Palpable bladder. +BS obese Extremities: no edema or cyanosis Neurologic: alert and oriented, grossly normal   Recent Labs Lab 04/12/14 1859 04/13/14 0421 04/13/14 1344 04/14/14 0323 04/15/14 0350  NA 132* 134*  --  134* 130*  K 5.5* 6.2* 5.4* 5.3 4.7  CL 97 100  --  99 96  CO2 22 22  --  19 19  GLUCOSE 121* 124*  --  97 79  BUN 49* 49*  --  55* 56*  CREATININE 2.63* 2.93*  --  3.70* 3.92*  ALBUMIN 4.0  --   --   --   --   CALCIUM 9.2 8.3*  --  8.0* 7.5*  AST 20  --   --   --   --   ALT 15  --   --   --   --    Liver Function Tests:  Recent Labs Lab 04/12/14 1859  AST 20  ALT 15  ALKPHOS 58  BILITOT 0.2*  PROT 7.3  ALBUMIN 4.0   No results found for this basename: LIPASE, AMYLASE,  in the last 168 hours No results found for this basename: AMMONIA,  in the last 168 hours CBC:  Recent Labs Lab 04/12/14 1859 04/13/14 0421 04/14/14 0323 04/14/14 1507 04/15/14 0350 04/16/14 0650  WBC 12.8* 13.6* 16.2*  --  14.0* 11.1*  NEUTROABS 10.7*  --   --   --   --   --   HGB 10.9* 10.2* 9.7* 9.2* 8.6* 8.7*  HCT 31.3* 29.8* 29.5*  27.3* 26.0* 25.7*  MCV 86.5 87.9 91.0  --  88.7 89.5  PLT 233 241 217  --  199 209   Cardiac Enzymes: No results found for this basename: CKTOTAL, CKMB, CKMBINDEX, TROPONINI,  in the last 168 hours CBG: No results found for this basename: GLUCAP,  in the last 168 hours  Iron Studies: No results found for this basename: IRON, TIBC, TRANSFERRIN, FERRITIN,  in the last 72 hours Studies/Results: Ct Abdomen Pelvis Wo Contrast  04/14/2014   CLINICAL DATA:  Ultrasound showed a complicated cyst on the right for which follow-up is recommended. Patient also has abdominal pain.  EXAM: CT ABDOMEN AND PELVIS WITHOUT CONTRAST  TECHNIQUE: Multidetector CT imaging of the abdomen and pelvis was performed following the standard protocol without IV contrast.  COMPARISON:  Renal ultrasound, 04/13/2014. Abdominal CT, 04/12/2014.  FINDINGS: The presumed renal sinus cyst noted on the prior CT, arising from the left kidney, is now hyper attenuating with average Hounsfield units of 58. This is consistent  with acute hemorrhage. Small focus of hyperattenuation is noted associated with a small mass in the left kidney upper pole, likely a small hemorrhagic cyst. This is stable. There is new perinephric stranding on the left extending along the proximal left ureter. The ureter is normal course and caliber with no stones. Multiple small bilateral renal masses are noted. Several show increased attenuation. These are likely combination of simple and hemorrhagic cysts. These are similar to the prior CT.  There is lung base opacity mostly dependently in the lower lobes, right greater than left, consistent with atelectasis, mildly increased from the prior study.  Small low-density lesions in noted near the dome of the left liver lobe, stable, likely cysts. Liver otherwise unremarkable.  Small splenic calcifications consistent healed granuloma. Spleen otherwise normal.  Gallbladder and pancreas are unremarkable. No bile duct dilation. No  adrenal mass.  Bladder is unremarkable.  Dense calcification is noted along mildly ectatic abdominal aorta and its branch vessels.  No pathologically enlarged lymph nodes. There are scattered prominent gastrohepatic ligament and retroperitoneal lymph nodes.  There are numerous colonic diverticula most prominent along the sigmoid colon. Small area of inflammatory type changes noted in the fat along the superior margin of the lower sigmoid colon adjacent to several diverticula. This is also adjacent to the distal right ureter. This is most likely related to the left renal pathology. Colon otherwise unremarkable. Normal small bowel.  Bony structures are diffusely demineralized. There are degenerative changes of the visualized spine. No osteoblastic or osteolytic lesions.  IMPRESSION: 1. Since the prior CT, the presumed left renal sinus cyst has hemorrhaged. It is now hyper attenuating consistent with acute blood. The cause of the hemorrhage is unclear. There is associated perinephric stranding extending along the left ureter. The left ureter is normal in course and in caliber. There is no evidence of obstructive uropathy. 2. No other convincing acute findings. 3. Other renal masses seen bilaterally appear to be a combination of simple cysts and cyst complicated by previous hemorrhage. These lesions are not fully characterized on the unenhanced CT imaging performed currently and previously or on the ultrasound. More comprehensive characterization of these lesions would be best performed with renal MRI with and without contrast if the patient can tolerate that procedure. 4. Multiple chronic findings as described.   Electronically Signed   By: Lajean Manes M.D.   On: 04/14/2014 11:50   Dg Chest 2 View  04/14/2014   CLINICAL DATA:  Cough. History of smoking in the past. History of hypertension and COPD. History of coronary artery disease.  EXAM: CHEST  2 VIEW  COMPARISON:  04/12/2014.  FINDINGS: Cardiac silhouette is  normal in size. Normal mediastinal and hilar contours.  Clear lungs.  No pleural effusion or pneumothorax.  Bony thorax is demineralized but intact.  IMPRESSION: No acute cardiopulmonary disease.   Electronically Signed   By: Lajean Manes M.D.   On: 04/14/2014 11:19   . amLODipine  10 mg Oral q morning - 10a  . atorvastatin  10 mg Oral q1800  . bisacodyl  10 mg Rectal Daily  . cefTRIAXone (ROCEPHIN)  IV  1 g Intravenous Q24H  . docusate sodium  200 mg Oral BID  . doxazosin  4 mg Oral Daily  . fluticasone  1 spray Each Nare Daily  . methylPREDNISolone (SOLU-MEDROL) injection  60 mg Intravenous Q12H  . metoprolol  100 mg Oral Daily  . mometasone-formoterol  2 puff Inhalation BID  . polyethylene glycol  17 g  Oral Daily  . tiotropium  18 mcg Inhalation Daily    Assessment/Plan:  1. Ruptured hemorrhagic renal cyst - urology following with planned outpt cystoscopy for 11/2 2. Acute on CKD: baseline Scr 2.1-2.3, continues to trend upward now 4.25. With worsening kidney function, the ruptured cyst may not be a likely explanation; possibly having obstruction. Will place foley catheter and start IV lasix. Repeat UA, spot sodium, protein, creatinine. Repeat renal U/S. Cyst does not explain AKI 3. Respiratory: worsened resp status, CXR this AM with right basilar atelectasis. Appears mostly pulm primary 4. Hyponatremia: baseline 130, stable 5. Hyperkalemia: resolved 6. Hypertension: stable 7. COPD: per primary  [place] Sat 8:20 AM I have seen and examined this patient and agree with the plan of care seen, eval, counseled.  Discussed with wife and primary .  Terry [name],Terry [name] 04/16/2014, 2:26 PM

## 2014-04-16 NOTE — Evaluation (Signed)
Physical Therapy Evaluation Patient Details Name: Terry [name] MRN: [id number] DOB: Mar 19, 1935 Today's Date: 04/16/2014   History of Present Illness  Pt is 79 yo male admitted with left flank pain due to ruptured hemorrhagic renal cyst. Pt with acute on chronic CKD with worsening renal function and right basilar atelectasis. PMH includes HTN, CAD, COPD, CVA in 2012, acute on chronic respiratory failure 5/14, OA bilateral knees.  Clinical Impression  Pt admitted with generalized weakness and very poor tolerance for activity. Pt currently with functional limitations due to the deficits listed below (see PT Problem List). Pt ambulated 66' with RW and min A with O2 desat to 85% on 6L. Pt will benefit from skilled PT to increase their independence and safety with mobility to allow discharge to the venue listed below. PT will continue to follow.       Follow Up Recommendations CIR    Equipment Recommendations  Rolling walker with 5" wheels    Recommendations for Other Services OT consult;Rehab consult     Precautions / Restrictions Precautions Precautions: Fall Restrictions Weight Bearing Restrictions: No      Mobility  Bed Mobility Overal bed mobility: Needs Assistance Bed Mobility: Sit to Supine       Sit to supine: Supervision   General bed mobility comments: sit to supine with supervision, able to get legs into bed with increased time and increased WOB, had to have HOB raised immediately to be able to breathe  Transfers Overall transfer level: Needs assistance Equipment used: Rolling walker (2 wheeled) Transfers: Sit to/from Stand Sit to Stand: Min assist         General transfer comment: min A to steady as pt lethargic, became impulsive when WOB increased and min A to slow pt down with return to sitting and cue to get all the way to bed  Ambulation/Gait Ambulation/Gait assistance: Min assist Ambulation Distance (Feet): 50 Feet Assistive device: Rolling walker (2  wheeled) Gait Pattern/deviations: Step-through pattern;Decreased stride length;Trunk flexed Gait velocity: decreased   General Gait Details: one standing rest break needed at 25', O2 sats decreased to 85% on 6L O2. Pt exhausted after ambulation and had to sit EOB x3 mins before performing bed mobility  Stairs            Wheelchair Mobility    Modified Rankin (Stroke Patients Only)       Balance Overall balance assessment: Needs assistance Sitting-balance support: No upper extremity supported;Feet supported Sitting balance-Leahy Scale: Good     Standing balance support: Bilateral upper extremity supported;During functional activity Standing balance-Leahy Scale: Poor Standing balance comment: pt requiring support to maintain standing today due to breathing difficulty                             Pertinent Vitals/Pain Pain Assessment: No/denies pain O2 sats 85% on 6L O2 with ambulation, increased to 95% with 3 mins rest    Home Living Family/patient expects to be discharged to:: Private residence Living Arrangements: Spouse/significant other Available Help at Discharge: Family;Available PRN/intermittently Type of Home: House Home Access: Other (comment) (pt preparing to go to [place], get more home info next visit)         Additional Comments: did not use AD to ambulate PTA    Prior Function Level of Independence: Independent         Comments: wife reported that some days pt could ambulate to mailbox but some days could not due to  SOB, overall sedentary     Hand Dominance        Extremity/Trunk Assessment   Upper Extremity Assessment: Generalized weakness           Lower Extremity Assessment: Generalized weakness      Cervical / Trunk Assessment: Kyphotic  Communication   Communication: No difficulties  Cognition Arousal/Alertness: Lethargic Behavior During Therapy: Flat affect Overall Cognitive Status: Impaired/Different from  baseline Area of Impairment: Attention   Current Attention Level: Alternating           General Comments: lethargy affecting cognitive status on eval    General Comments      Exercises        Assessment/Plan    PT Assessment Patient needs continued PT services  PT Diagnosis Difficulty walking;Generalized weakness   PT Problem List Decreased strength;Decreased range of motion;Decreased activity tolerance;Decreased balance;Decreased mobility;Decreased cognition;Decreased knowledge of use of DME;Decreased knowledge of precautions;Cardiopulmonary status limiting activity;Obesity  PT Treatment Interventions DME instruction;Gait training;Stair training;Functional mobility training;Therapeutic activities;Therapeutic exercise;Balance training;Patient/family education   PT Goals (Current goals can be found in the Care Plan section) Acute Rehab PT Goals Patient Stated Goal: none stated PT Goal Formulation: With patient Time For Goal Achievement: 04/30/14 Potential to Achieve Goals: Fair    Frequency Min 3X/week   Barriers to discharge        Co-evaluation               End of Session Equipment Utilized During Treatment: Gait belt;Oxygen Activity Tolerance: Patient limited by lethargy;Patient limited by fatigue Patient left: in bed;with nursing/sitter in room Nurse Communication: Mobility status         Time: 1334-1400 PT Time Calculation (min): 26 min   Charges:   PT Evaluation $Initial PT Evaluation Tier I: 1 Procedure PT Treatments $Gait Training: 8-22 mins $Therapeutic Activity: 8-22 mins   PT G Codes:        Leighton Roach, PT  Acute Rehab Services  [place], [place] 04/16/2014, 2:28 PM

## 2014-04-16 NOTE — Progress Notes (Signed)
Patient Demographics  Terry [name], is a 79 y.o. male, DOB - [birth year], [id number]  Admit date - 04/12/2014   Admitting Physician Ivor Costa, MD  Outpatient Primary MD for the patient is [name], DO  LOS - 4   Chief Complaint  Patient presents with  . Back Pain  . Flank Pain        Subjective:   [name] today has, No headache, No chest pain, improved left-sided flank pain  - No Nausea, No new weakness tingling or numbness, No Cough - SOB.    Assessment & Plan    1. Left flank pain -  due to hemorrhage and a renal cyst repeat CT confirms, Urology following, D/W Dr Alinda Money on 04-14-14, likely cystoscopy outpatient, will continue to follow closely his symptoms and renal function. CT showed some perinephric stranding on the left side, had ++ leukocytosis improving on empiric IV Rocephin, follow clinically, Uro and Renal on board.    2. Acute renal failure on top of chronic disease stage IV. Baseline creatinine around 2. Has certainly worsened, avoid nephrotoxins, noted urine electrolytes. Repeat urine electrolytes, discussed with nephrologist Dr. Fransico Michael on 04/16/2014, place Foley for strict intake and output monitoring, trial of low-dose Lasix as now he is getting short of breath and has developed wheezing.    3. Hyponatremia -  Per renal.    4. Hypertension. Stable on beta blocker and Norvasc continue.    5. CAD. No acute issue. Continue beta blocker, hold aspirin for now.    6.Constipation. Place on bowel regimen and monitor.    7. Shortness of breath. Likely due to combination of mild fluid overload and questionable COPD exacerbation, trial of Solu-Medrol IV, low-dose Lasix nebulizer and oxygen as needed.    Code Status: Full  Family Communication:  Wife  Disposition Plan: home   Procedures CT scan abdomen pelvis, renal ultrasound   Consults nephrology, Urology   Medications  Scheduled Meds: . amLODipine  10 mg Oral q morning - 10a  . atorvastatin  10 mg Oral q1800  . bisacodyl  10 mg Rectal Daily  . cefTRIAXone (ROCEPHIN)  IV  1 g Intravenous Q24H  . docusate sodium  200 mg Oral BID  . doxazosin  4 mg Oral Daily  . fluticasone  1 spray Each Nare Daily  . furosemide  20 mg Intravenous Once  . methylPREDNISolone (SOLU-MEDROL) injection  60 mg Intravenous Q12H  . metoprolol  100 mg Oral Daily  . mometasone-formoterol  2 puff Inhalation BID  . polyethylene glycol  17 g Oral Daily  . tiotropium  18 mcg Inhalation Daily   Continuous Infusions:   PRN Meds:.albuterol, morphine injection, naLOXone (NARCAN)  injection, ondansetron (ZOFRAN) IV  DVT Prophylaxis SCDs    Lab Results  Component Value Date   PLT 209 04/16/2014    Antibiotics     Anti-infectives   Start     Dose/Rate Route Frequency Ordered Stop   04/14/14 1300  cefTRIAXone (ROCEPHIN) 1 g in dextrose 5 % 50 mL IVPB     1 g 100 mL/hr over 30 Minutes Intravenous Every 24 hours 04/14/14 1158            Objective:   Filed Vitals:  04/15/14 2002 04/15/14 2224 04/16/14 0500 04/16/14 0627  BP:  158/55  156/56  Pulse:  73  83  Temp:  97.6 F (36.4 C)  98.2 F (36.8 C)  TempSrc:  Oral    Resp:    20  Height:      Weight:   102.3 kg (225 lb 8.5 oz)   SpO2: 92% 99%  91%    Wt Readings from Last 3 Encounters:  04/16/14 102.3 kg (225 lb 8.5 oz)  12/01/13 88.905 kg (196 lb)  11/02/13 88.451 kg (195 lb)     Intake/Output Summary (Last 24 hours) at 04/16/14 1115 Last data filed at 04/15/14 1703  Gross per 24 hour  Intake   1500 ml  Output      0 ml  Net   1500 ml     Physical Exam  Awake Alert, Oriented X 3, No new F.N deficits, Normal affect [place].AT,PERRAL Supple Neck,No JVD, No cervical lymphadenopathy appriciated.  Symmetrical Chest  wall movement, Good air movement bilaterally, +ve wheezing RRR,No Gallops,Rubs or new Murmurs, No Parasternal Heave +ve B.Sounds, Abd mildly distended, +ve L flank tenderness, No organomegaly appriciated, No rebound - guarding or rigidity. No Cyanosis, Clubbing or edema, No new Rash or bruise     Data Review   Micro Results No results found for this or any previous visit (from the past 240 hour(s)).  Radiology Reports Ct Abdomen Pelvis Wo Contrast  04/12/2014   CLINICAL DATA:  Left flank pain all day, no history of kidney stones, stage III renal failure  EXAM: CT ABDOMEN AND PELVIS WITHOUT CONTRAST  TECHNIQUE: Multidetector CT imaging of the abdomen and pelvis was performed following the standard protocol without IV contrast.  COMPARISON:  None.  FINDINGS: Lung bases are unremarkable. The study is limited without IV contrast. Unenhanced liver shows no biliary ductal dilatation.  No calcified gallstones are noted within gallbladder. Unenhanced pancreas spleen and adrenal glands are unremarkable. Extensive atherosclerotic calcifications of abdominal aorta and iliac arteries. No aortic aneurysm.  Unenhanced kidneys shows a lobulated contour. Multiple exophytic lesions are noted bilateral kidneys. This may represent renal cyst but cannot be characterized without IV contrast. A probable cyst in lower pole of the right kidney measures 1.2 cm. Probable cyst in upper pole of the right kidney measures 1.4 cm. There are few high density exophytic lesions. This may represent hemorrhagic or proteinaceous cyst. The largest in upper pole of the right kidney measures 8 mm. The largest high density lesion in upper pole of the left kidney measures 8 mm.  There is a central parapelvic cyst in midpole of the left kidney with peripheral high-density material. This measures 3.4 x 2.8 cm. Some of the high density material is extending in central calyces of the lower pole of the left kidney as seen in axial image 61.  Findings may represent hemorrhagic products within cyst or cystic neoplasm. Further correlation with enhanced study and/or urology exam is recommended.  No nephrolithiasis. No calcified ureteral calculi. Oral contrast material was given to the patient. No small bowel or colonic obstruction. Stool noted in right colon and transverse colon. There is no pericecal inflammation. Sigmoid colon diverticula are noted. No evidence of acute diverticulitis. Prostate gland and seminal vesicles are unremarkable. No calcified calculi are noted within urinary bladder. There is a small left hydrocele.  IMPRESSION: 1. Limited study without IV contrast. There is a central parapelvic cyst in midpole of the left kidney with peripheral high-density material. This measures 3.4 x  2.8 cm. Some of the high density material is extending in central calyces of the lower pole of the left kidney as seen in axial image 61. Findings may represent hemorrhagic products within cyst or cystic neoplasm. Further correlation with enhanced study and/or urology exam is recommended. 2. No nephrolithiasis. No calcified ureteral calculi. No hydronephrosis or hydroureter. 3. Multiple exophytic lesions bilateral kidney cannot be characterized without IV contrast. Although this may represent cysts some of the lesions are high density. Hemorrhagic or proteinaceous cysts cannot be excluded. 4. No calcified calculi are noted within urinary bladder. 5. Sigmoid colon diverticula are noted. No evidence of acute diverticulitis. 6. Small left hydrocele.   Electronically Signed   By: Lahoma Crocker M.D.   On: 04/12/2014 22:25   Dg Chest Portable 1 View  04/12/2014   CLINICAL DATA:  Left flank pain all day. Kidney problems. Abdominal pain.  EXAM: PORTABLE CHEST - 1 VIEW  COMPARISON:  11/29/2012  FINDINGS: The heart size and mediastinal contours are within normal limits. Both lungs are clear. The visualized skeletal structures are unremarkable.  IMPRESSION: No active  disease.   Electronically Signed   By: Lucienne Capers M.D.   On: 04/12/2014 21:08     CBC  Recent Labs Lab 04/12/14 1859 04/13/14 0421 04/14/14 0323 04/14/14 1507 04/15/14 0350 04/16/14 0650  WBC 12.8* 13.6* 16.2*  --  14.0* 11.1*  HGB 10.9* 10.2* 9.7* 9.2* 8.6* 8.7*  HCT 31.3* 29.8* 29.5* 27.3* 26.0* 25.7*  PLT 233 241 217  --  199 209  MCV 86.5 87.9 91.0  --  88.7 89.5  MCH 30.1 30.1 29.9  --  29.4 30.3  MCHC 34.8 34.2 32.9  --  33.1 33.9  RDW 12.9 13.2 13.6  --  13.4 13.3  LYMPHSABS 1.1  --   --   --   --   --   MONOABS 0.8  --   --   --   --   --   EOSABS 0.2  --   --   --   --   --   BASOSABS 0.0  --   --   --   --   --     Chemistries   Recent Labs Lab 04/12/14 1859 04/13/14 0421 04/13/14 1344 04/14/14 0323 04/15/14 0350 04/16/14 0650  NA 132* 134*  --  134* 130* 128*  K 5.5* 6.2* 5.4* 5.3 4.7 4.4  CL 97 100  --  99 96 93*  CO2 22 22  --  19 19 17*  GLUCOSE 121* 124*  --  97 79 106*  BUN 49* 49*  --  55* 56* 68*  CREATININE 2.63* 2.93*  --  3.70* 3.92* 4.25*  CALCIUM 9.2 8.3*  --  8.0* 7.5* 7.7*  AST 20  --   --   --   --   --   ALT 15  --   --   --   --   --   ALKPHOS 58  --   --   --   --   --   BILITOT 0.2*  --   --   --   --   --    ------------------------------------------------------------------------------------------------------------------ estimated creatinine clearance is 17.2 ml/min (by C-G formula based on Cr of 4.25). ------------------------------------------------------------------------------------------------------------------ No results found for this basename: HGBA1C,  in the last 72 hours ------------------------------------------------------------------------------------------------------------------ No results found for this basename: CHOL, HDL, LDLCALC, TRIG, CHOLHDL, LDLDIRECT,  in the last 72 hours ------------------------------------------------------------------------------------------------------------------ No results  found for this basename: TSH, T4TOTAL, FREET3, T3FREE, THYROIDAB,  in the last 72 hours ------------------------------------------------------------------------------------------------------------------ No results found for this basename: VITAMINB12, FOLATE, FERRITIN, TIBC, IRON, RETICCTPCT,  in the last 72 hours  Coagulation profile  Recent Labs Lab 04/13/14 0421  INR 1.05    No results found for this basename: DDIMER,  in the last 72 hours  Cardiac Enzymes No results found for this basename: CK, CKMB, TROPONINI, MYOGLOBIN,  in the last 168 hours ------------------------------------------------------------------------------------------------------------------ No components found with this basename: POCBNP,      Time Spent in minutes   35   SINGH,PRASHANT K M.D on 04/16/2014 at 11:15 AM  Between 7am to 7pm - Pager - [phone]  After 7pm go to [email or web address] - password [email or web address]  And look for the night coverage person covering for me after hours  [facility] Office  [phone]

## 2014-04-16 NOTE — Progress Notes (Signed)
Rehab Admissions Coordinator Note:  Patient was screened by Cleatrice Burke for appropriateness for an Inpatient Acute Rehab Consult per PT recommendation. At this time, we are recommending [place]. Pt has [employer] Medicare not silverback, so doubtful would approve pt for admission with this diagnosis.  Cleatrice Burke 04/16/2014, 3:00 PM  I can be reached at [phone].

## 2014-04-17 LAB — BASIC METABOLIC PANEL
Anion gap: 20 — ABNORMAL HIGH (ref 5–15)
BUN: 79 mg/dL — AB (ref 6–23)
CALCIUM: 8 mg/dL — AB (ref 8.4–10.5)
CO2: 16 mEq/L — ABNORMAL LOW (ref 19–32)
Chloride: 89 mEq/L — ABNORMAL LOW (ref 96–112)
Creatinine, Ser: 4.29 mg/dL — ABNORMAL HIGH (ref 0.50–1.35)
GFR calc non Af Amer: 12 mL/min — ABNORMAL LOW (ref >90)
GFR, EST AFRICAN AMERICAN: 14 mL/min — AB (ref >90)
Glucose, Bld: 149 mg/dL — ABNORMAL HIGH (ref 70–99)
POTASSIUM: 5.2 meq/L (ref 3.7–5.3)
Sodium: 125 mEq/L — ABNORMAL LOW (ref 137–147)

## 2014-04-17 LAB — OSMOLALITY, URINE: OSMOLALITY UR: 338 mosm/kg — AB (ref 390–1090)

## 2014-04-17 MED ORDER — ALBUTEROL SULFATE (2.5 MG/3ML) 0.083% IN NEBU: 3.0000 mL | INHALATION_SOLUTION | RESPIRATORY_TRACT | Status: DC | PRN

## 2014-04-17 MED ORDER — AMLODIPINE BESYLATE 5 MG PO TABS
5.0000 mg | ORAL_TABLET | Freq: Every day | ORAL | Status: DC
  Administered 2014-04-18 – 2014-04-19 (×2): 5 mg via ORAL
  Filled 2014-04-17 (×4): qty 1

## 2014-04-17 MED ORDER — ALBUTEROL SULFATE (2.5 MG/3ML) 0.083% IN NEBU
3.0000 mL | INHALATION_SOLUTION | Freq: Three times a day (TID) | RESPIRATORY_TRACT | Status: DC
  Administered 2014-04-18 – 2014-04-20 (×6): 3 mL via RESPIRATORY_TRACT
  Filled 2014-04-17 (×7): qty 3

## 2014-04-17 MED ORDER — METOPROLOL TARTRATE 12.5 MG HALF TABLET
12.5000 mg | ORAL_TABLET | Freq: Two times a day (BID) | ORAL | Status: DC
  Administered 2014-04-17 – 2014-04-20 (×6): 12.5 mg via ORAL
  Filled 2014-04-17 (×8): qty 1

## 2014-04-17 MED ORDER — ALBUTEROL SULFATE (2.5 MG/3ML) 0.083% IN NEBU
3.0000 mL | INHALATION_SOLUTION | Freq: Four times a day (QID) | RESPIRATORY_TRACT | Status: DC
  Administered 2014-04-17: 3 mL via RESPIRATORY_TRACT
  Filled 2014-04-17: qty 3

## 2014-04-17 NOTE — Progress Notes (Signed)
PT Cancellation Note  Patient Details Name: Terry [name] MRN: [id number] DOB: 09-13-1934   Cancelled Treatment:    Reason Eval/Treat Not Completed: Fatigue/lethargy limiting ability to participate. Attempted to work with patient this afternoon however patient sitting up in chair and fatigued. Patient stated that he walked with nurse tech earlier today and has been sitting up most of the day. Requested that PT come back in the morning and he would walk then   Jacqualyn Posey 04/17/2014, 2:53 PM 04/17/2014 Jacqualyn Posey PTA [phone] pager [phone] office

## 2014-04-17 NOTE — Clinical Social Work Placement (Signed)
Clinical Social Work Department CLINICAL SOCIAL WORK PLACEMENT NOTE 04/17/2014  Patient:  Terry [name], Terry [name]  Account Number:  [id number] Admit date:  04/12/2014  Clinical Social Worker:  Domenica Reamer, CLINICAL SOCIAL WORKER  Date/time:  04/17/2014 10:56 AM  Clinical Social Work is seeking post-discharge placement for this patient at the following level of care:   [place]   (*CSW will update this form in Epic as items are completed)   04/17/2014  Patient/family provided with [place] Department of Clinical Social Work's list of facilities offering this level of care within the geographic area requested by the patient (or if unable, by the patient's family).  04/17/2014  Patient/family informed of their freedom to choose among providers that offer the needed level of care, that participate in Medicare, Medicaid or managed care program needed by the patient, have an available bed and are willing to accept the patient.  04/17/2014  Patient/family informed of [facility]' ownership interest in [facility], as well as of the fact that they are under no obligation to receive care at this facility.  PASARR submitted to EDS on 04/17/2014 PASARR number received on 04/17/2014  FL2 transmitted to all facilities in geographic area requested by pt/family on  04/17/2014 FL2 transmitted to all facilities within larger geographic area on   Patient informed that his/her managed care company has contracts with or will negotiate with  certain facilities, including the following:     Patient/family informed of bed offers received:   Patient chooses bed at  Physician recommends and patient chooses bed at    Patient to be transferred to  on   Patient to be transferred to facility by  Patient and family notified of transfer on  Name of family member notified:    The following physician request were entered in Epic:   Additional Comments:   Domenica Reamer, [place] Social  Worker [phone]

## 2014-04-17 NOTE — Clinical Social Work Psychosocial (Signed)
Clinical Social Work Department BRIEF PSYCHOSOCIAL ASSESSMENT 04/17/2014  Patient:  Terry [name], Terry [name]     Account Number:  [id number]     Admit date:  04/12/2014  Clinical Social Worker:  Domenica Reamer, [place]  Date/Time:  04/17/2014 09:45 AM  Referred by:  Physician  Date Referred:  04/16/2014 Referred for  SNF Placement   Other Referral:   Interview type:  Patient Other interview type:    PSYCHOSOCIAL DATA Living Status:  WIFE Admitted from facility:   Level of care:   Primary support name:  Lansana Guzman Primary support relationship to patient:  SPOUSE Degree of support available:   Patient reports high level of support from his wife.    CURRENT CONCERNS Current Concerns  Post-Acute Placement   Other Concerns:    SOCIAL WORK ASSESSMENT / PLAN CSW spoke with patient concerning PT recommendation for CIR.  CSW informed patient that often his insurance will not cover CIR so medical team then recommends SNF. Patient is agreeable to bedsearch in [place] and [facility]- patient has stayed at [facility] before and would prefer not to return there but is willing if necessary.  CSW will continue to follow.   Assessment/plan status:  Psychosocial Support/Ongoing Assessment of Needs Other assessment/ plan:   FL2 update   Information/referral to community resources:   [place] SNF    PATIENT'S/FAMILY'S RESPONSE TO PLAN OF CARE: Patient is agreeable to bedsearch but wishes he were able to go home instead.       Domenica Reamer, [place] Social Worker [phone]

## 2014-04-17 NOTE — Progress Notes (Signed)
Patient Demographics  Terry [name], is a 79 y.o. male, DOB - [birth year], [id number]  Admit date - 04/12/2014   Admitting Physician Ivor Costa, MD  Outpatient Primary MD for the patient is [name], DO  LOS - 5   Chief Complaint  Patient presents with  . Back Pain  . Flank Pain        Subjective:   [name] today has, No headache, No chest pain, improved left-sided flank pain  - No Nausea, No new weakness tingling or numbness, No Cough but +ve wheezing and SOB.    Assessment & Plan    1. Left flank pain -  due to hemorrhage and a renal cyst repeat CT confirms, Urology following, D/W Dr Alinda Money on 04-14-14, likely cystoscopy outpatient, will continue to follow closely his symptoms and renal function. CT showed some perinephric stranding on the left side, had ++ leukocytosis improving on empiric IV Rocephin, follow clinically, Uro and Renal on board.    2. Acute renal failure on top of chronic disease stage IV. Baseline creatinine around 2. Has certainly worsened, avoid nephrotoxins, noted urine electrolytes. Repeat urine electrolytes, discussed with nephrologist Dr. Fransico Michael on 04/16/2014, placed Foley for strict intake and output monitoring requested by renal, no evidence of fluid overload currently on exam or on x-ray. Renal following will defer management of fluids and acute renal failure to renal.   3. Hyponatremia -  Per renal.    4. Hypertension. Stable on beta blocker and Norvasc continue.    5. CAD. No acute issue. Continue beta blocker, hold aspirin for now.    6.Constipation. Place on bowel regimen and monitor.    7. Shortness of breath. Likely due to combination of mild fluid overload and questionable COPD exacerbation, trial of Solu-Medrol IV, low-dose Lasix  given on 04/17/2014 now appears euvolemic, continue scheduled and as needed nebulizer and oxygen as needed. Have added flutter valve along with incentive spirometer. Requested to sit up in the chair in the daytime. Monitor pulse ox closely.    Code Status: Full  Family Communication: Wife  Disposition Plan: home   Procedures CT scan abdomen pelvis, renal ultrasound   Consults nephrology, Urology   Medications  Scheduled Meds: . albuterol  3 mL Nebulization Q6H  . amLODipine  10 mg Oral q morning - 10a  . atorvastatin  10 mg Oral q1800  . bisacodyl  10 mg Rectal Daily  . cefTRIAXone (ROCEPHIN)  IV  1 g Intravenous Q24H  . docusate sodium  200 mg Oral BID  . doxazosin  4 mg Oral Daily  . fluticasone  1 spray Each Nare Daily  . methylPREDNISolone (SOLU-MEDROL) injection  60 mg Intravenous Q12H  . metoprolol  100 mg Oral Daily  . mometasone-formoterol  2 puff Inhalation BID  . polyethylene glycol  17 g Oral Daily  . tiotropium  18 mcg Inhalation Daily   Continuous Infusions:   PRN Meds:.albuterol, morphine injection, naLOXone (NARCAN)  injection, ondansetron (ZOFRAN) IV  DVT Prophylaxis SCDs    Lab Results  Component Value Date   PLT 209 04/16/2014    Antibiotics     Anti-infectives   Start     Dose/Rate Route Frequency Ordered Stop   04/14/14 1300  cefTRIAXone (ROCEPHIN) 1 g in dextrose 5 % 50 mL IVPB     1 g 100 mL/hr over 30 Minutes Intravenous Every 24 hours 04/14/14 1158            Objective:   Filed Vitals:   04/16/14 1519 04/16/14 2057 04/16/14 2206 04/17/14 0517  BP:  140/65  137/59  Pulse:  72  77  Temp:  98.4 F (36.9 C)  97.6 F (36.4 C)  TempSrc:  Oral  Oral  Resp:  19  20  Height:      Weight:    103 kg (227 lb 1.2 oz)  SpO2: 94% 94% 98% 94%    Wt Readings from Last 3 Encounters:  04/17/14 103 kg (227 lb 1.2 oz)  12/01/13 88.905 kg (196 lb)  11/02/13 88.451 kg (195 lb)     Intake/Output Summary (Last 24 hours) at 04/17/14  1018 Last data filed at 04/17/14 0545  Gross per 24 hour  Intake      0 ml  Output   1150 ml  Net  -1150 ml     Physical Exam  Awake Alert, Oriented X 3, No new F.N deficits, Normal affect [place].AT,PERRAL Supple Neck,No JVD, No cervical lymphadenopathy appriciated.  Symmetrical Chest wall movement, Good air movement bilaterally, corse bilateral breath sounds with +ve wheezing  RRR,No Gallops,Rubs or new Murmurs, No Parasternal Heave +ve B.Sounds, Abd mildly distended, +ve L flank tenderness, No organomegaly appriciated, No rebound - guarding or rigidity. No Cyanosis, Clubbing or edema, No new Rash or bruise     Data Review   Micro Results No results found for this or any previous visit (from the past 240 hour(s)).  Radiology Reports Ct Abdomen Pelvis Wo Contrast  04/12/2014   CLINICAL DATA:  Left flank pain all day, no history of kidney stones, stage III renal failure  EXAM: CT ABDOMEN AND PELVIS WITHOUT CONTRAST  TECHNIQUE: Multidetector CT imaging of the abdomen and pelvis was performed following the standard protocol without IV contrast.  COMPARISON:  None.  FINDINGS: Lung bases are unremarkable. The study is limited without IV contrast. Unenhanced liver shows no biliary ductal dilatation.  No calcified gallstones are noted within gallbladder. Unenhanced pancreas spleen and adrenal glands are unremarkable. Extensive atherosclerotic calcifications of abdominal aorta and iliac arteries. No aortic aneurysm.  Unenhanced kidneys shows a lobulated contour. Multiple exophytic lesions are noted bilateral kidneys. This may represent renal cyst but cannot be characterized without IV contrast. A probable cyst in lower pole of the right kidney measures 1.2 cm. Probable cyst in upper pole of the right kidney measures 1.4 cm. There are few high density exophytic lesions. This may represent hemorrhagic or proteinaceous cyst. The largest in upper pole of the right kidney measures 8 mm. The largest high  density lesion in upper pole of the left kidney measures 8 mm.  There is a central parapelvic cyst in midpole of the left kidney with peripheral high-density material. This measures 3.4 x 2.8 cm. Some of the high density material is extending in central calyces of the lower pole of the left kidney as seen in axial image 61. Findings may represent hemorrhagic products within cyst or cystic neoplasm. Further correlation with enhanced study and/or urology exam is recommended.  No nephrolithiasis. No calcified ureteral calculi. Oral contrast material was given to the patient. No small bowel or colonic obstruction. Stool noted in right colon and transverse colon. There is no pericecal inflammation. Sigmoid colon diverticula are noted. No evidence of  acute diverticulitis. Prostate gland and seminal vesicles are unremarkable. No calcified calculi are noted within urinary bladder. There is a small left hydrocele.  IMPRESSION: 1. Limited study without IV contrast. There is a central parapelvic cyst in midpole of the left kidney with peripheral high-density material. This measures 3.4 x 2.8 cm. Some of the high density material is extending in central calyces of the lower pole of the left kidney as seen in axial image 61. Findings may represent hemorrhagic products within cyst or cystic neoplasm. Further correlation with enhanced study and/or urology exam is recommended. 2. No nephrolithiasis. No calcified ureteral calculi. No hydronephrosis or hydroureter. 3. Multiple exophytic lesions bilateral kidney cannot be characterized without IV contrast. Although this may represent cysts some of the lesions are high density. Hemorrhagic or proteinaceous cysts cannot be excluded. 4. No calcified calculi are noted within urinary bladder. 5. Sigmoid colon diverticula are noted. No evidence of acute diverticulitis. 6. Small left hydrocele.   Electronically Signed   By: Lahoma Crocker M.D.   On: 04/12/2014 22:25   Dg Chest Portable 1  View  04/12/2014   CLINICAL DATA:  Left flank pain all day. Kidney problems. Abdominal pain.  EXAM: PORTABLE CHEST - 1 VIEW  COMPARISON:  11/29/2012  FINDINGS: The heart size and mediastinal contours are within normal limits. Both lungs are clear. The visualized skeletal structures are unremarkable.  IMPRESSION: No active disease.   Electronically Signed   By: Lucienne Capers M.D.   On: 04/12/2014 21:08     CBC  Recent Labs Lab 04/12/14 1859 04/13/14 0421 04/14/14 0323 04/14/14 1507 04/15/14 0350 04/16/14 0650  WBC 12.8* 13.6* 16.2*  --  14.0* 11.1*  HGB 10.9* 10.2* 9.7* 9.2* 8.6* 8.7*  HCT 31.3* 29.8* 29.5* 27.3* 26.0* 25.7*  PLT 233 241 217  --  199 209  MCV 86.5 87.9 91.0  --  88.7 89.5  MCH 30.1 30.1 29.9  --  29.4 30.3  MCHC 34.8 34.2 32.9  --  33.1 33.9  RDW 12.9 13.2 13.6  --  13.4 13.3  LYMPHSABS 1.1  --   --   --   --   --   MONOABS 0.8  --   --   --   --   --   EOSABS 0.2  --   --   --   --   --   BASOSABS 0.0  --   --   --   --   --     Chemistries   Recent Labs Lab 04/12/14 1859 04/13/14 0421 04/13/14 1344 04/14/14 0323 04/15/14 0350 04/16/14 0650 04/17/14 0411  NA 132* 134*  --  134* 130* 128* 125*  K 5.5* 6.2* 5.4* 5.3 4.7 4.4 5.2  CL 97 100  --  99 96 93* 89*  CO2 22 22  --  19 19 17* 16*  GLUCOSE 121* 124*  --  97 79 106* 149*  BUN 49* 49*  --  55* 56* 68* 79*  CREATININE 2.63* 2.93*  --  3.70* 3.92* 4.25* 4.29*  CALCIUM 9.2 8.3*  --  8.0* 7.5* 7.7* 8.0*  AST 20  --   --   --   --   --   --   ALT 15  --   --   --   --   --   --   ALKPHOS 58  --   --   --   --   --   --  BILITOT 0.2*  --   --   --   --   --   --    ------------------------------------------------------------------------------------------------------------------ estimated creatinine clearance is 17 ml/min (by C-G formula based on Cr of 4.29). ------------------------------------------------------------------------------------------------------------------ No results found for  this basename: HGBA1C,  in the last 72 hours ------------------------------------------------------------------------------------------------------------------ No results found for this basename: CHOL, HDL, LDLCALC, TRIG, CHOLHDL, LDLDIRECT,  in the last 72 hours ------------------------------------------------------------------------------------------------------------------ No results found for this basename: TSH, T4TOTAL, FREET3, T3FREE, THYROIDAB,  in the last 72 hours ------------------------------------------------------------------------------------------------------------------ No results found for this basename: VITAMINB12, FOLATE, FERRITIN, TIBC, IRON, RETICCTPCT,  in the last 72 hours  Coagulation profile  Recent Labs Lab 04/13/14 0421  INR 1.05    No results found for this basename: DDIMER,  in the last 72 hours  Cardiac Enzymes No results found for this basename: CK, CKMB, TROPONINI, MYOGLOBIN,  in the last 168 hours ------------------------------------------------------------------------------------------------------------------ No components found with this basename: POCBNP,      Time Spent in minutes   35   Britnay Magnussen K M.D on 04/17/2014 at 10:18 AM  Between 7am to 7pm - Pager - [phone]  After 7pm go to [email or web address] - password [email or web address]  And look for the night coverage person covering for me after hours  [facility] Office  [phone]

## 2014-04-17 NOTE — Progress Notes (Signed)
Patient ID: Terry [name], male    DOB: 10-Mar-1935, 79 y.o.   MRN: [id number] Nephrology Progress Note  S: Feels better. Breathing is okay.  O:BP 137/59  Pulse 77  Temp(Src) 97.6 F (36.4 C) (Oral)  Resp 20  Ht 5\' 8"  (1.727 m)  Wt 227 lb 1.2 oz (103 kg)  BMI 34.53 kg/m2  SpO2 94%  Intake/Output Summary (Last 24 hours) at 04/17/14 [id number] Last data filed at 04/17/14 0545  Gross per 24 hour  Intake      0 ml  Output   1150 ml  Net  -1150 ml   Intake/Output: I/O last 3 completed shifts: In: -  Out: 1150 [Urine:1150]  Intake/Output this shift:    Weight change: 1 lb 8.7 oz (0.7 kg) General: NAD, sitting upright in chair Resp: wheezes scattered. [place] in place. Marked decrease bs, and prolonged expir phase Cardio: RRR, normal s1/s2, no mrg GI: Distended, tympanic to percussion. +BS obese Extremities: 2+ edema LE Neurologic: alert and oriented, grossly normal   Recent Labs Lab 04/12/14 1859 04/13/14 0421 04/13/14 1344 04/14/14 0323 04/15/14 0350 04/16/14 0650 04/17/14 0411  NA 132* 134*  --  134* 130* 128* 125*  K 5.5* 6.2* 5.4* 5.3 4.7 4.4 5.2  CL 97 100  --  99 96 93* 89*  CO2 22 22  --  19 19 17* 16*  GLUCOSE 121* 124*  --  97 79 106* 149*  BUN 49* 49*  --  55* 56* 68* 79*  CREATININE 2.63* 2.93*  --  3.70* 3.92* 4.25* 4.29*  ALBUMIN 4.0  --   --   --   --   --   --   CALCIUM 9.2 8.3*  --  8.0* 7.5* 7.7* 8.0*  AST 20  --   --   --   --   --   --   ALT 15  --   --   --   --   --   --    Liver Function Tests:  Recent Labs Lab 04/12/14 1859  AST 20  ALT 15  ALKPHOS 58  BILITOT 0.2*  PROT 7.3  ALBUMIN 4.0   No results found for this basename: LIPASE, AMYLASE,  in the last 168 hours No results found for this basename: AMMONIA,  in the last 168 hours CBC:  Recent Labs Lab 04/12/14 1859 04/13/14 0421 04/14/14 0323 04/14/14 1507 04/15/14 0350 04/16/14 0650  WBC 12.8* 13.6* 16.2*  --  14.0* 11.1*  NEUTROABS 10.7*  --   --   --   --   --   HGB 10.9*  10.2* 9.7* 9.2* 8.6* 8.7*  HCT 31.3* 29.8* 29.5* 27.3* 26.0* 25.7*  MCV 86.5 87.9 91.0  --  88.7 89.5  PLT 233 241 217  --  199 209   Cardiac Enzymes: No results found for this basename: CKTOTAL, CKMB, CKMBINDEX, TROPONINI,  in the last 168 hours CBG: No results found for this basename: GLUCAP,  in the last 168 hours  Iron Studies: No results found for this basename: IRON, TIBC, TRANSFERRIN, FERRITIN,  in the last 72 hours Studies/Results: Dg Chest 2 View  04/16/2014   CLINICAL DATA:  Chronic shortness of breath increased today, personal history of COPD, coronary artery disease, hypertension, stroke, former smoker  EXAM: CHEST  2 VIEW  COMPARISON:  04/14/2014  FINDINGS: Lordotic positioning.  Borderline enlargement of cardiac silhouette.  Atherosclerotic calcification aorta.  Mediastinal contours and pulmonary vascularity normal.  Emphysematous and minimal  bronchitic changes.  Mild RIGHT basilar atelectasis.  No acute infiltrate, pleural effusion or pneumothorax.  Mild scattered endplate spur formation thoracic spine.  IMPRESSION: Borderline enlargement of cardiac silhouette.  COPD changes with RIGHT basilar atelectasis.   Electronically Signed   By: Lavonia Dana M.D.   On: 04/16/2014 10:06   US Renal  04/16/2014   CLINICAL DATA:  Hypertension.  Kidney disease.  EXAM: RENAL/URINARY TRACT ULTRASOUND COMPLETE  COMPARISON:  CT 04/14/2014.  FINDINGS: Right Kidney:  Length: 12.6 cm. Echogenicity within normal limits. No prominent mass or hydronephrosis visualized. Multiple cysts some of which may be complex, largest measures 1 cm.  Left Kidney:  Length: 14.0 cm. Echogenicity within normal limits. No hydronephrosis visualized. Innumerable cysts noted. A large 4.4 x 3.1 x 2.4 cm complex cystic structure noted in the renal hilum consistent with previously identified hemorrhagic cyst.  As noted on prior CT report 04/14/2014 further evaluation of patient's bilateral renal cysts would best be evaluated with  nonenhanced and enhanced renal MRI .  Bladder:  Patient has a Foley catheter.  Bladder not visualized.  IMPRESSION: 1. Large 4.4 x 3.1 x 2.4 cm complex cystic structure in the left renal hilum in the region previously identified hemorrhagic cyst. 2. Innumerable bilateral renal cysts. As noted on prior CT report 04/14/2014 the patient renal cystic lesions including the large hemorrhagic cyst in the left kidney would best be evaluated by nonenhanced and enhanced renal MRI.   Electronically Signed   By: Marcello Moores  Register   On: 04/16/2014 16:07   . amLODipine  10 mg Oral q morning - 10a  . atorvastatin  10 mg Oral q1800  . bisacodyl  10 mg Rectal Daily  . cefTRIAXone (ROCEPHIN)  IV  1 g Intravenous Q24H  . docusate sodium  200 mg Oral BID  . doxazosin  4 mg Oral Daily  . fluticasone  1 spray Each Nare Daily  . methylPREDNISolone (SOLU-MEDROL) injection  60 mg Intravenous Q12H  . metoprolol  100 mg Oral Daily  . mometasone-formoterol  2 puff Inhalation BID  . polyethylene glycol  17 g Oral Daily  . tiotropium  18 mcg Inhalation Daily    Assessment/Plan:  1. Ruptured hemorrhagic renal cyst - urology following with planned outpt cystoscopy for 11/2 2. Acute on CKD: baseline Scr 2.1-2.3, may be plateau around 4.29. With worsening kidney function, the ruptured cyst would not be a likely explanation; possibly having obstruction; foley placed 10/26 with 1.1L out past 24hrs. Labs showing FENA<1%. Will get CBC with diff tomorrow, ?AIN.Concern of toxic injury also.  3. Respiratory: stable currently. 4. Hyponatremia: baseline 130, stable 5. Hyperkalemia: resolved 6. Hypertension: stable 7. COPD: per primary ? RHF  Tawanna Sat I have seen and examined this patient and agree with the plan of care seen, eval, examined, and discussed.  Exact cause of course not clear, will do ongoing eval .  Tehran Rabenold,Christino L 04/17/2014, 1:53 PM  8:21 AM

## 2014-04-17 NOTE — Care Management Note (Signed)
  Page 2 of 2   04/20/2014     10:26:36 AM CARE MANAGEMENT NOTE 04/20/2014  Patient:  Terry [name], Terry [name]   Account Number:  [id number]  Date Initiated:  04/17/2014  Documentation initiated by:  Magdalen Spatz  Subjective/Objective Assessment:     Action/Plan:   Anticipated DC Date:  04/20/2014   Anticipated DC Plan:  [place] referral  Clinical Social Worker         Choice offered to / List presented to:  C-1 Patient   DME arranged  NEBULIZER/MEDS  [facility]      DME agency  [place] arranged  HH-1 RN  [place]      [place].   Status of service:  Completed, signed off Medicare Important Message given?  YES (If response is "NO", the following Medicare IM given date fields will be blank) Date Medicare IM given:  04/17/2014 Medicare IM given by:  Magdalen Spatz Date Additional Medicare IM given:  04/20/2014 Additional Medicare IM given by:  Magdalen Spatz  Discharge Disposition:  [place]  Per UR Regulation:  Reviewed for med. necessity/level of care/duration of stay  If discussed at [place] of Stay Meetings, dates discussed:   04/17/2014  04/19/2014    Comments:  04-20-14 Explained to patient due to [facility] unable to order DME through [place], ordered through [facility] .  Patient states he has a walker at home . Ordered oxygen and NEB machine . Called [facility] spoke to [facility] , faxed orders , Pam aware patient discharging today . Magdalen Spatz RN BSN [phone]

## 2014-04-18 LAB — BASIC METABOLIC PANEL
Anion gap: 19 — ABNORMAL HIGH (ref 5–15)
BUN: 88 mg/dL — ABNORMAL HIGH (ref 6–23)
CHLORIDE: 91 meq/L — AB (ref 96–112)
CO2: 17 mEq/L — ABNORMAL LOW (ref 19–32)
Calcium: 8.1 mg/dL — ABNORMAL LOW (ref 8.4–10.5)
Creatinine, Ser: 4.26 mg/dL — ABNORMAL HIGH (ref 0.50–1.35)
GFR, EST AFRICAN AMERICAN: 14 mL/min — AB (ref >90)
GFR, EST NON AFRICAN AMERICAN: 12 mL/min — AB (ref >90)
Glucose, Bld: 161 mg/dL — ABNORMAL HIGH (ref 70–99)
POTASSIUM: 5 meq/L (ref 3.7–5.3)
SODIUM: 127 meq/L — AB (ref 137–147)

## 2014-04-18 LAB — CBC
HCT: 26.4 % — ABNORMAL LOW (ref 39.0–52.0)
HEMOGLOBIN: 9.1 g/dL — AB (ref 13.0–17.0)
MCH: 30.1 pg (ref 26.0–34.0)
MCHC: 34.5 g/dL (ref 30.0–36.0)
MCV: 87.4 fL (ref 78.0–100.0)
PLATELETS: 269 10*3/uL (ref 150–400)
RBC: 3.02 MIL/uL — AB (ref 4.22–5.81)
RDW: 13.1 % (ref 11.5–15.5)
WBC: 9.8 10*3/uL (ref 4.0–10.5)

## 2014-04-18 LAB — DIFFERENTIAL
Basophils Absolute: 0 10*3/uL (ref 0.0–0.1)
Basophils Relative: 0 % (ref 0–1)
Eosinophils Absolute: 0 10*3/uL (ref 0.0–0.7)
Eosinophils Relative: 0 % (ref 0–5)
LYMPHS ABS: 0.4 10*3/uL — AB (ref 0.7–4.0)
LYMPHS PCT: 4 % — AB (ref 12–46)
Monocytes Absolute: 0.4 10*3/uL (ref 0.1–1.0)
Monocytes Relative: 4 % (ref 3–12)
NEUTROS PCT: 92 % — AB (ref 43–77)
Neutro Abs: 9.1 10*3/uL — ABNORMAL HIGH (ref 1.7–7.7)

## 2014-04-18 MED ORDER — POLYETHYLENE GLYCOL 3350 17 G PO PACK
17.0000 g | PACK | Freq: Two times a day (BID) | ORAL | Status: DC
  Administered 2014-04-18 – 2014-04-19 (×2): 17 g via ORAL
  Filled 2014-04-18 (×5): qty 1

## 2014-04-18 MED ORDER — SODIUM BICARBONATE 650 MG PO TABS
1300.0000 mg | ORAL_TABLET | Freq: Two times a day (BID) | ORAL | Status: DC
  Administered 2014-04-18 – 2014-04-20 (×5): 1300 mg via ORAL
  Filled 2014-04-18 (×6): qty 2

## 2014-04-18 MED ORDER — FUROSEMIDE 80 MG PO TABS
80.0000 mg | ORAL_TABLET | Freq: Two times a day (BID) | ORAL | Status: DC
  Administered 2014-04-18 – 2014-04-20 (×4): 80 mg via ORAL
  Filled 2014-04-18: qty 1
  Filled 2014-04-18 (×2): qty 2
  Filled 2014-04-18 (×3): qty 1

## 2014-04-18 NOTE — Progress Notes (Signed)
PT Cancellation Note  Patient Details Name: Terry [name] MRN: [id number] DOB: 10-Aug-1934   Cancelled Treatment:    Reason Eval/Treat Not Completed: Fatigue/lethargy limiting ability to participate. Attempt to see patient around 1:30 and patient had just gotten lunch and was sitting up to eat. Explained to patient that I would come back in 30-45 mins to walk with him and patient was agreeable. Upon returning to room 45 minutes later, patient had just gotten back into bed with help from his wife and was not agreeable to ambulation despite encouragement and PT attempting to be flexible with patient request. Encouraged patient to ambulate with nursing staff later this evening. Will attempt to work with patient tomorrow if he is willing   Jacqualyn Posey 04/18/2014, 2:13 PM

## 2014-04-18 NOTE — Progress Notes (Signed)
Subjective: Interval History: has complaints constip.  Objective: Vital signs in last 24 hours: Temp:  [97.4 F (36.3 C)-97.8 F (36.6 C)] 97.8 F (36.6 C) (10/28 0549) Pulse Rate:  [63-79] 79 (10/28 0549) Resp:  [16-18] 18 (10/28 0549) BP: (124-134)/(54-71) 127/63 mmHg (10/28 0549) SpO2:  [90 %-99 %] 94 % (10/28 0941) Weight:  [102.059 kg (225 lb)] 102.059 kg (225 lb) (10/28 0549) Weight change: -0.941 kg (-2 lb 1.2 oz)  Intake/Output from previous day: 10/27 0701 - 10/28 0700 In: 580 [P.O.:480; IV Piggyback:100] Out: 1075 [Urine:1075] Intake/Output this shift:    General appearance: alert, cooperative, moderately obese and pale Resp: diminished breath sounds bilaterally and wheezes bibasilar Cardio: S1, S2 normal and systolic murmur: holosystolic 2/6, blowing at apex GI: obese, pos bs, liver down 6 cm Extremities: edema 3+  Lab Results:  Recent Labs  04/16/14 0650  WBC 11.1*  HGB 8.7*  HCT 25.7*  PLT 209   BMET:  Recent Labs  04/17/14 0411 04/18/14 0500  NA 125* 127*  K 5.2 5.0  CL 89* 91*  CO2 16* 17*  GLUCOSE 149* 161*  BUN 79* 88*  CREATININE 4.29* 4.26*  CALCIUM 8.0* 8.1*   No results found for this basename: PTH,  in the last 72 hours Iron Studies: No results found for this basename: IRON, TIBC, TRANSFERRIN, FERRITIN,  in the last 72 hours  Studies/Results: US Renal  04/16/2014   CLINICAL DATA:  Hypertension.  Kidney disease.  EXAM: RENAL/URINARY TRACT ULTRASOUND COMPLETE  COMPARISON:  CT 04/14/2014.  FINDINGS: Right Kidney:  Length: 12.6 cm. Echogenicity within normal limits. No prominent mass or hydronephrosis visualized. Multiple cysts some of which may be complex, largest measures 1 cm.  Left Kidney:  Length: 14.0 cm. Echogenicity within normal limits. No hydronephrosis visualized. Innumerable cysts noted. A large 4.4 x 3.1 x 2.4 cm complex cystic structure noted in the renal hilum consistent with previously identified hemorrhagic cyst.  As noted  on prior CT report 04/14/2014 further evaluation of patient's bilateral renal cysts would best be evaluated with nonenhanced and enhanced renal MRI .  Bladder:  Patient has a Foley catheter.  Bladder not visualized.  IMPRESSION: 1. Large 4.4 x 3.1 x 2.4 cm complex cystic structure in the left renal hilum in the region previously identified hemorrhagic cyst. 2. Innumerable bilateral renal cysts. As noted on prior CT report 04/14/2014 the patient renal cystic lesions including the large hemorrhagic cyst in the left kidney would best be evaluated by nonenhanced and enhanced renal MRI.   Electronically Signed   By: Marcello Moores  Register   On: 04/16/2014 16:07    I have reviewed the patient's current medications.  Assessment/Plan: 1 AKI still little improvement.   Acidemic ,vol xs, low SNa.  Will add Fursoemide and bicarb.  2 Copd 3 Obstructive Uropathy 4 Cyst rupture 5 obesity 6 Constip use bid Miralax 7 DJD P Lasix,Miralax ,bicarb    LOS: 6 days   Terry [name],Terry [name] 04/18/2014,10:55 AM

## 2014-04-18 NOTE — Progress Notes (Signed)
Called Dr. Lynne Logan scheduler to ask about plan of care if Terry [name] will have his surgery before he is discharged or be discharged.  Also if he will need a PAT visit prior to his surgery or can he be a same day.

## 2014-04-18 NOTE — Progress Notes (Signed)
Patient Demographics  Terry [name], is a 79 y.o. male, DOB - [birth year], [id number]  Admit date - 04/12/2014   Admitting Physician Terry Costa, MD  Outpatient Primary MD for the patient is Terry Kern., DO  LOS - 6   Chief Complaint  Patient presents with  . Back Pain  . Flank Pain        Subjective:   Terry [name] [name] has, No headache, No chest pain, improved left-sided flank pain  - No Nausea, No new weakness tingling or numbness, No Cough but +ve wheezing and SOB much improved..    Assessment & Plan    1. Left flank pain -  due to hemorrhage and a renal cyst repeat CT confirms, Urology following, D/W Dr Alinda Money on 04-14-14, likely cystoscopy outpatient, will continue to follow closely his symptoms and renal function. CT showed some perinephric stranding on the left side, had ++ leukocytosis improving on empiric IV Rocephin, follow clinically, Uro and Renal on board.    2. Acute renal failure on top of chronic disease stage IV. Baseline creatinine around 2. Has certainly worsened, avoid nephrotoxins, no signs of obstruction on ultrasound, has been adequately hydrated, renal function improving, renal following. Currently off of fluids and Lasix.   3. Hyponatremia -  Per renal. He due to fluid overload at this time.    4. Hypertension. Stable on beta blocker and Norvasc continue.    5. CAD. No acute issue. Continue beta blocker, hold aspirin for now.    6.Constipation. Place on bowel regimen and monitor.    7. Shortness of breath. Likely due to combination of mild fluid overload and questionable COPD exacerbation, improving with trial of Solu-Medrol IV, self diuresing well, hold off any extra fluids, Have added flutter valve along with incentive spirometer. Requested to sit up  in the chair in the daytime. Monitor pulse ox closely. Overall much improved [name].    Code Status: Full  Family Communication: Wife  Disposition Plan: home   Procedures CT scan abdomen pelvis, renal ultrasound   Consults nephrology, Urology   Medications  Scheduled Meds: . albuterol  3 mL Nebulization TID  . amLODipine  5 mg Oral QHS  . atorvastatin  10 mg Oral q1800  . bisacodyl  10 mg Rectal Daily  . cefTRIAXone (ROCEPHIN)  IV  1 g Intravenous Q24H  . docusate sodium  200 mg Oral BID  . doxazosin  4 mg Oral Daily  . fluticasone  1 spray Each Nare Daily  . methylPREDNISolone (SOLU-MEDROL) injection  60 mg Intravenous Q12H  . metoprolol  12.5 mg Oral BID  . mometasone-formoterol  2 puff Inhalation BID  . polyethylene glycol  17 g Oral Daily  . tiotropium  18 mcg Inhalation Daily   Continuous Infusions:   PRN Meds:.albuterol, morphine injection, naLOXone (NARCAN)  injection, ondansetron (ZOFRAN) IV  DVT Prophylaxis SCDs    Lab Results  Component Value Date   PLT 209 04/16/2014    Antibiotics     Anti-infectives   Start     Dose/Rate Route Frequency Ordered Stop   04/14/14 1300  cefTRIAXone (ROCEPHIN) 1 g in dextrose 5 % 50 mL IVPB     1 g 100 mL/hr over 30 Minutes Intravenous Every  24 hours 04/14/14 1158            Objective:   Filed Vitals:   04/17/14 2112 04/17/14 2114 04/18/14 0549 04/18/14 0941  BP:  134/71 127/63   Pulse:  63 79   Temp:  97.4 F (36.3 C) 97.8 F (36.6 C)   TempSrc:  Oral Oral   Resp:  18 18   Height:      Weight:   102.059 kg (225 lb)   SpO2: 99% 98% 90% 94%    Wt Readings from Last 3 Encounters:  04/18/14 102.059 kg (225 lb)  12/01/13 88.905 kg (196 lb)  11/02/13 88.451 kg (195 lb)     Intake/Output Summary (Last 24 hours) at 04/18/14 1042 Last data filed at 04/18/14 0447  Gross per 24 hour  Intake    340 ml  Output   1075 ml  Net   -735 ml     Physical Exam  Awake Alert, Oriented X 3, No new F.N  deficits, Normal affect [place].AT,PERRAL Supple Neck,No JVD, No cervical lymphadenopathy appriciated.  Symmetrical Chest wall movement, Good air movement bilaterally, corse bilateral breath sounds with +ve wheezing  RRR,No Gallops,Rubs or new Murmurs, No Parasternal Heave +ve B.Sounds, Abd mildly distended, +ve L flank tenderness, No organomegaly appriciated, No rebound - guarding or rigidity. No Cyanosis, Clubbing or edema, No new Rash or bruise     Data Review   Micro Results No results found for this or any previous visit (from the past 240 hour(s)).  Radiology Reports Ct Abdomen Pelvis Wo Contrast  04/12/2014   CLINICAL DATA:  Left flank pain all day, no history of kidney stones, stage III renal failure  EXAM: CT ABDOMEN AND PELVIS WITHOUT CONTRAST  TECHNIQUE: Multidetector CT imaging of the abdomen and pelvis was performed following the standard protocol without IV contrast.  COMPARISON:  None.  FINDINGS: Lung bases are unremarkable. The study is limited without IV contrast. Unenhanced liver shows no biliary ductal dilatation.  No calcified gallstones are noted within gallbladder. Unenhanced pancreas spleen and adrenal glands are unremarkable. Extensive atherosclerotic calcifications of abdominal aorta and iliac arteries. No aortic aneurysm.  Unenhanced kidneys shows a lobulated contour. Multiple exophytic lesions are noted bilateral kidneys. This may represent renal cyst but cannot be characterized without IV contrast. A probable cyst in lower pole of the right kidney measures 1.2 cm. Probable cyst in upper pole of the right kidney measures 1.4 cm. There are few high density exophytic lesions. This may represent hemorrhagic or proteinaceous cyst. The largest in upper pole of the right kidney measures 8 mm. The largest high density lesion in upper pole of the left kidney measures 8 mm.  There is a central parapelvic cyst in midpole of the left kidney with peripheral high-density material. This  measures 3.4 x 2.8 cm. Some of the high density material is extending in central calyces of the lower pole of the left kidney as seen in axial image 61. Findings may represent hemorrhagic products within cyst or cystic neoplasm. Further correlation with enhanced study and/or urology exam is recommended.  No nephrolithiasis. No calcified ureteral calculi. Oral contrast material was given to the patient. No small bowel or colonic obstruction. Stool noted in right colon and transverse colon. There is no pericecal inflammation. Sigmoid colon diverticula are noted. No evidence of acute diverticulitis. Prostate gland and seminal vesicles are unremarkable. No calcified calculi are noted within urinary bladder. There is a small left hydrocele.  IMPRESSION: 1. Limited study without  IV contrast. There is a central parapelvic cyst in midpole of the left kidney with peripheral high-density material. This measures 3.4 x 2.8 cm. Some of the high density material is extending in central calyces of the lower pole of the left kidney as seen in axial image 61. Findings may represent hemorrhagic products within cyst or cystic neoplasm. Further correlation with enhanced study and/or urology exam is recommended. 2. No nephrolithiasis. No calcified ureteral calculi. No hydronephrosis or hydroureter. 3. Multiple exophytic lesions bilateral kidney cannot be characterized without IV contrast. Although this may represent cysts some of the lesions are high density. Hemorrhagic or proteinaceous cysts cannot be excluded. 4. No calcified calculi are noted within urinary bladder. 5. Sigmoid colon diverticula are noted. No evidence of acute diverticulitis. 6. Small left hydrocele.   Electronically Signed   By: Lahoma Crocker M.D.   On: 04/12/2014 22:25   Dg Chest Portable 1 View  04/12/2014   CLINICAL DATA:  Left flank pain all day. Kidney problems. Abdominal pain.  EXAM: PORTABLE CHEST - 1 VIEW  COMPARISON:  11/29/2012  FINDINGS: The heart size  and mediastinal contours are within normal limits. Both lungs are clear. The visualized skeletal structures are unremarkable.  IMPRESSION: No active disease.   Electronically Signed   By: Lucienne Capers M.D.   On: 04/12/2014 21:08     CBC  Recent Labs Lab 04/12/14 1859 04/13/14 0421 04/14/14 0323 04/14/14 1507 04/15/14 0350 04/16/14 0650  WBC 12.8* 13.6* 16.2*  --  14.0* 11.1*  HGB 10.9* 10.2* 9.7* 9.2* 8.6* 8.7*  HCT 31.3* 29.8* 29.5* 27.3* 26.0* 25.7*  PLT 233 241 217  --  199 209  MCV 86.5 87.9 91.0  --  88.7 89.5  MCH 30.1 30.1 29.9  --  29.4 30.3  MCHC 34.8 34.2 32.9  --  33.1 33.9  RDW 12.9 13.2 13.6  --  13.4 13.3  LYMPHSABS 1.1  --   --   --   --   --   MONOABS 0.8  --   --   --   --   --   EOSABS 0.2  --   --   --   --   --   BASOSABS 0.0  --   --   --   --   --     Chemistries   Recent Labs Lab 04/12/14 1859  04/14/14 0323 04/15/14 0350 04/16/14 0650 04/17/14 0411 04/18/14 0500  NA 132*  < > 134* 130* 128* 125* 127*  K 5.5*  < > 5.3 4.7 4.4 5.2 5.0  CL 97  < > 99 96 93* 89* 91*  CO2 22  < > 19 19 17* 16* 17*  GLUCOSE 121*  < > 97 79 106* 149* 161*  BUN 49*  < > 55* 56* 68* 79* 88*  CREATININE 2.63*  < > 3.70* 3.92* 4.25* 4.29* 4.26*  CALCIUM 9.2  < > 8.0* 7.5* 7.7* 8.0* 8.1*  AST 20  --   --   --   --   --   --   ALT 15  --   --   --   --   --   --   ALKPHOS 58  --   --   --   --   --   --   BILITOT 0.2*  --   --   --   --   --   --   < > = values in this interval  not displayed. ------------------------------------------------------------------------------------------------------------------ estimated creatinine clearance is 17.1 ml/min (by C-G formula based on Cr of 4.26). ------------------------------------------------------------------------------------------------------------------ No results found for this basename: HGBA1C,  in the last 72  hours ------------------------------------------------------------------------------------------------------------------ No results found for this basename: CHOL, HDL, LDLCALC, TRIG, CHOLHDL, LDLDIRECT,  in the last 72 hours ------------------------------------------------------------------------------------------------------------------ No results found for this basename: TSH, T4TOTAL, FREET3, T3FREE, THYROIDAB,  in the last 72 hours ------------------------------------------------------------------------------------------------------------------ No results found for this basename: VITAMINB12, FOLATE, FERRITIN, TIBC, IRON, RETICCTPCT,  in the last 72 hours  Coagulation profile  Recent Labs Lab 04/13/14 0421  INR 1.05    No results found for this basename: DDIMER,  in the last 72 hours  Cardiac Enzymes No results found for this basename: CK, CKMB, TROPONINI, MYOGLOBIN,  in the last 168 hours ------------------------------------------------------------------------------------------------------------------ No components found with this basename: POCBNP,      Time Spent in minutes   35   Skyleigh Windle K M.D on 04/18/2014 at 10:42 AM  Between 7am to 7pm - Pager - [phone]  After 7pm go to [email or web address] - password [email or web address]  And look for the night coverage person covering for me after hours  [facility] Office  [phone]

## 2014-04-19 LAB — COMPREHENSIVE METABOLIC PANEL
ALBUMIN: 2.9 g/dL — AB (ref 3.5–5.2)
ALK PHOS: 51 U/L (ref 39–117)
ALT: 20 U/L (ref 0–53)
ANION GAP: 18 — AB (ref 5–15)
AST: 18 U/L (ref 0–37)
BUN: 91 mg/dL — AB (ref 6–23)
CHLORIDE: 93 meq/L — AB (ref 96–112)
CO2: 19 meq/L (ref 19–32)
Calcium: 8 mg/dL — ABNORMAL LOW (ref 8.4–10.5)
Creatinine, Ser: 3.91 mg/dL — ABNORMAL HIGH (ref 0.50–1.35)
GFR calc Af Amer: 16 mL/min — ABNORMAL LOW (ref >90)
GFR calc non Af Amer: 14 mL/min — ABNORMAL LOW (ref >90)
GLUCOSE: 166 mg/dL — AB (ref 70–99)
POTASSIUM: 4.7 meq/L (ref 3.7–5.3)
Sodium: 130 mEq/L — ABNORMAL LOW (ref 137–147)
Total Bilirubin: <0.2 mg/dL — ABNORMAL LOW (ref 0.3–1.2)
Total Protein: 6.1 g/dL (ref 6.0–8.3)

## 2014-04-19 LAB — CBC
HEMATOCRIT: 24.7 % — AB (ref 39.0–52.0)
Hemoglobin: 8.7 g/dL — ABNORMAL LOW (ref 13.0–17.0)
MCH: 29.9 pg (ref 26.0–34.0)
MCHC: 35.2 g/dL (ref 30.0–36.0)
MCV: 84.9 fL (ref 78.0–100.0)
PLATELETS: 268 10*3/uL (ref 150–400)
RBC: 2.91 MIL/uL — AB (ref 4.22–5.81)
RDW: 12.9 % (ref 11.5–15.5)
WBC: 9.3 10*3/uL (ref 4.0–10.5)

## 2014-04-19 LAB — PHOSPHORUS: Phosphorus: 7 mg/dL — ABNORMAL HIGH (ref 2.3–4.6)

## 2014-04-19 MED ORDER — PREDNISONE 5 MG PO TABS: ORAL_TABLET | ORAL | Status: DC

## 2014-04-19 MED ORDER — CEFUROXIME AXETIL 500 MG PO TABS
500.0000 mg | ORAL_TABLET | Freq: Two times a day (BID) | ORAL | Status: DC
  Administered 2014-04-19 – 2014-04-20 (×2): 500 mg via ORAL
  Filled 2014-04-19 (×4): qty 1

## 2014-04-19 MED ORDER — HYDROCODONE-ACETAMINOPHEN 5-325 MG PO TABS: 1.0000 | ORAL_TABLET | ORAL | Status: DC | PRN

## 2014-04-19 MED ORDER — DSS 100 MG PO CAPS: 200.0000 mg | ORAL_CAPSULE | Freq: Two times a day (BID) | ORAL | Status: DC | PRN

## 2014-04-19 MED ORDER — HYDROCODONE-ACETAMINOPHEN 5-325 MG PO TABS: 1.0000 | ORAL_TABLET | Freq: Four times a day (QID) | ORAL | Status: DC | PRN

## 2014-04-19 MED ORDER — POLYETHYLENE GLYCOL 3350 17 G PO PACK: 17.0000 g | PACK | Freq: Two times a day (BID) | ORAL | Status: DC

## 2014-04-19 MED ORDER — SODIUM BICARBONATE 650 MG PO TABS: 1300.0000 mg | ORAL_TABLET | Freq: Two times a day (BID) | ORAL | Status: DC

## 2014-04-19 NOTE — Progress Notes (Signed)
Physical Therapy Treatment Patient Details Name: Terry [name] MRN: [id number] DOB: 1934-09-13 Today's Date: 04/19/2014    History of Present Illness Pt is 79 yo male admitted with left flank pain due to ruptured hemorrhagic renal cyst. Pt with acute on chronic CKD with worsening renal function and right basilar atelectasis. PMH includes HTN, CAD, COPD, CVA in 2012, acute on chronic respiratory failure 5/14, OA bilateral knees.    PT Comments    Patient making great progress today with ambulation. Patient wanting to go home at discharge and should be able to based on progress made. Wife can take some time off work to assist if needed. Will follow up  Follow Up Recommendations  Home health PT;Supervision - Intermittent     Equipment Recommendations  Rolling walker with 5" wheels    Recommendations for Other Services       Precautions / Restrictions Precautions Precautions: Fall Precaution Comments: on 2L O2 throughout Restrictions Weight Bearing Restrictions: No    Mobility  Bed Mobility Overal bed mobility: Modified Independent             General bed mobility comments: Increased effort but no assistance required  Transfers Overall transfer level: Needs assistance Equipment used: Rolling walker (2 wheeled) Transfers: Sit to/from Stand Sit to Stand: Min guard         General transfer comment: Cues for safe technique  Ambulation/Gait Ambulation/Gait assistance: Min guard Ambulation Distance (Feet): 200 Feet Assistive device: Rolling walker (2 wheeled) Gait Pattern/deviations: Step-through pattern;Decreased stride length Gait velocity: decreased   General Gait Details: Cues for positioning of RW. No rest break required. Patient dropped to 88%  with O2   Stairs            Wheelchair Mobility    Modified Rankin (Stroke Patients Only)       Balance                                    Cognition Arousal/Alertness:  Awake/alert Behavior During Therapy: WFL for tasks assessed/performed Overall Cognitive Status: Within Functional Limits for tasks assessed                      Exercises      General Comments        Pertinent Vitals/Pain Pain Assessment: No/denies pain    Home Living                      Prior Function            PT Goals (current goals can now be found in the care plan section) Progress towards PT goals: Progressing toward goals    Frequency  Min 3X/week    PT Plan      Co-evaluation             End of Session Equipment Utilized During Treatment: Gait belt;Oxygen Activity Tolerance: Patient tolerated treatment well Patient left: in chair;with call bell/phone within reach     Time: 1025-1044 PT Time Calculation (min): 19 min  Charges:  $Gait Training: 8-22 mins                    G Codes:      Jacqualyn Posey 04/19/2014, 11:10 AM 04/19/2014 Jacqualyn Posey PTA [phone] pager [phone] office

## 2014-04-19 NOTE — Progress Notes (Signed)
Pt a/o, no c/o pain, pt slept well throughout night, vss, pt stable

## 2014-04-19 NOTE — Progress Notes (Signed)
Pt desats to 88 on room air without activity and to 82 with amb. In room.  o2 replaced at 2l.

## 2014-04-19 NOTE — Clinical Social Work Note (Signed)
PT is now recommending home health.  Patient is agreeable to plan.  CSW signing off.  Domenica Reamer, [place] Social Worker [phone]

## 2014-04-19 NOTE — Progress Notes (Signed)
Patient ID: Terry [name], male    DOB: [birth year], 79 y.o.   MRN: [id number] Nephrology Progress Note  S: tired from walking with PT, feels good  O:BP 146/59  Pulse 87  Temp(Src) 97.9 F (36.6 C) (Oral)  Resp 18  Ht 5\' 8"  (1.727 m)  Wt 224 lb (101.606 kg)  BMI 34.07 kg/m2  SpO2 94%  Intake/Output Summary (Last 24 hours) at 04/19/14 0810 Last data filed at 04/19/14 0549  Gross per 24 hour  Intake    650 ml  Output   3750 ml  Net  -3100 ml   Intake/Output: I/O last 3 completed shifts: In: 650 [P.O.:600; IV Piggyback:50] Out: [id number] [Urine:4275]  Intake/Output this shift:    Weight change: 4.8 oz (0.136 kg) Gen: NAD CVS: RRR, 2/6 systolic murmur Resp: wheezes throughout, diminished BS Abd: soft, obese, +BS Ext: edema 2+ LE   Recent Labs Lab 04/12/14 1859 04/13/14 0421 04/13/14 1344 04/14/14 0323 04/15/14 0350 04/16/14 0650 04/17/14 0411 04/18/14 0500 04/19/14 0445  NA 132* 134*  --  134* 130* 128* 125* 127* 130*  K 5.5* 6.2* 5.4* 5.3 4.7 4.4 5.2 5.0 4.7  CL 97 100  --  99 96 93* 89* 91* 93*  CO2 22 22  --  19 19 17* 16* 17* 19  GLUCOSE 121* 124*  --  97 79 106* 149* 161* 166*  BUN 49* 49*  --  55* 56* 68* 79* 88* 91*  CREATININE 2.63* 2.93*  --  3.70* 3.92* 4.25* 4.29* 4.26* 3.91*  ALBUMIN 4.0  --   --   --   --   --   --   --  2.9*  CALCIUM 9.2 8.3*  --  8.0* 7.5* 7.7* 8.0* 8.1* 8.0*  PHOS  --   --   --   --   --   --   --   --  7.0*  AST 20  --   --   --   --   --   --   --  18  ALT 15  --   --   --   --   --   --   --  20   Liver Function Tests:  Recent Labs Lab 04/12/14 1859 04/19/14 0445  AST 20 18  ALT 15 20  ALKPHOS 58 51  BILITOT 0.2* <0.2*  PROT 7.3 6.1  ALBUMIN 4.0 2.9*   No results found for this basename: LIPASE, AMYLASE,  in the last 168 hours No results found for this basename: AMMONIA,  in the last 168 hours CBC:  Recent Labs Lab 04/12/14 1859  04/14/14 0323  04/15/14 0350 04/16/14 0650 04/18/14 1211 04/19/14 0445  WBC  12.8*  < > 16.2*  --  14.0* 11.1* 9.8 9.3  NEUTROABS 10.7*  --   --   --   --   --  9.1*  --   HGB 10.9*  < > 9.7*  < > 8.6* 8.7* 9.1* 8.7*  HCT 31.3*  < > 29.5*  < > 26.0* 25.7* 26.4* 24.7*  MCV 86.5  < > 91.0  --  88.7 89.5 87.4 84.9  PLT 233  < > 217  --  199 209 269 268  < > = values in this interval not displayed. Cardiac Enzymes: No results found for this basename: CKTOTAL, CKMB, CKMBINDEX, TROPONINI,  in the last 168 hours CBG: No results found for this basename: GLUCAP,  in the last 168 hours  Iron  Studies: No results found for this basename: IRON, TIBC, TRANSFERRIN, FERRITIN,  in the last 72 hours Studies/Results: No results found. [name] albuterol  3 mL Nebulization TID  . amLODipine  5 mg Oral QHS  . atorvastatin  10 mg Oral q1800  . bisacodyl  10 mg Rectal Daily  . cefTRIAXone (ROCEPHIN)  IV  1 g Intravenous Q24H  . docusate sodium  200 mg Oral BID  . doxazosin  4 mg Oral Daily  . fluticasone  1 spray Each Nare Daily  . furosemide  80 mg Oral BID  . methylPREDNISolone (SOLU-MEDROL) injection  60 mg Intravenous Q12H  . metoprolol  12.5 mg Oral BID  . mometasone-formoterol  2 puff Inhalation BID  . polyethylene glycol  17 g Oral BID  . sodium bicarbonate  1,300 mg Oral BID  . tiotropium  18 mcg Inhalation Daily    BMET    Component Value Date/Time   NA 130* 04/19/2014 0445   K 4.7 04/19/2014 0445   CL 93* 04/19/2014 0445   CO2 19 04/19/2014 0445   GLUCOSE 166* 04/19/2014 0445   BUN 91* 04/19/2014 0445   CREATININE 3.91* 04/19/2014 0445   CALCIUM 8.0* 04/19/2014 0445   GFRNONAA 14* 04/19/2014 0445   GFRAA 16* 04/19/2014 0445   CBC    Component Value Date/Time   WBC 9.3 04/19/2014 0445   RBC 2.91* 04/19/2014 0445   HGB 8.7* 04/19/2014 0445   HCT 24.7* 04/19/2014 0445   PLT 268 04/19/2014 0445   MCV 84.9 04/19/2014 0445   MCH 29.9 04/19/2014 0445   MCHC 35.2 04/19/2014 0445   RDW 12.9 04/19/2014 0445   LYMPHSABS 0.4* 04/18/2014 1211   MONOABS 0.4  04/18/2014 1211   EOSABS 0.0 04/18/2014 1211   BASOSABS 0.0 04/18/2014 1211    Assessment/Plan: 1. AKI marginal improv Scr/plateau. Good UOP with addition of lasix. On bicarb, acidemia improving CKD 3-4 at baseline , with ?superimposed ATN.  Diuresing and improving 2. Anemia 3. CKD-MBD: elevated phos 4. Obstructive uropathy needs Urology to see and cont foley 5. Renal cyst rupture 6. COPD 7. Obesity P: cont diuresis for another ~10lbs, d/c on lasix 80mg  daily, keep one more day and check labs tomorrow  Tawanna Sat 8:10 AM I have seen and examined this patient and agree with the plan of care  Seen, eval, examined, and discussed with resident and patient counseled. .  Masey Scheiber,Demichael L 04/19/2014, 12:21 PM

## 2014-04-19 NOTE — Progress Notes (Signed)
Patient Demographics  Terry [name], is a 79 y.o. male, DOB - Jul 11, 1934, [id number]  Admit date - 04/12/2014   Admitting Physician Ivor Costa, MD  Outpatient Primary MD for the patient is [name], DO  LOS - 7   Chief Complaint  Patient presents with  . Back Pain  . Flank Pain        Subjective:   Terry [name] today has, No headache, No chest pain, improved left-sided flank pain  - No Nausea, No new weakness tingling or numbness, No Cough , resolved wheezing and SOB much improved..    Assessment & Plan    1. Left flank pain -  due to hemorrhage and a renal cyst repeat CT confirms, Urology following, D/W Dr Alinda Money on 04-14-14, likely cystoscopy outpatient, will continue to follow closely his symptoms and renal function. CT showed some perinephric stranding on the left side, had ++ leukocytosis resolved on empiric IV Rocephin will switch to 5 more days of PO Ceftin on 04-19-14, follow clinically, Uro and Renal on board.    2. Acute renal failure on top of chronic disease stage IV. Baseline creatinine around 2. Has certainly worsened, avoid nephrotoxins, no signs of obstruction on ultrasound, has been adequately hydrated, renal function improving, renal following. Currently off of fluids and Lasix.   3. Hyponatremia -  Per renal. Improving with self diuresis.    4. Hypertension. Stable on beta blocker and Norvasc continue.    5. CAD. No acute issue. Continue beta blocker, hold aspirin for now.    6.Constipation. Placed on bowel regimen and monitor.    7. Shortness of breath. Likely due to combination of mild fluid overload and questionable COPD exacerbation, improving with trial of Solu-Medrol IV, self diuresing well, hold off any extra fluids, Have added flutter valve along  with incentive spirometer. Requested to sit up in the chair in the daytime. Monitor pulse ox closely. Overall much improved today. Will try off of o2.    8. Bladder outlet Obstruction - foley, alpha blocker, uro follow up, will go with foley per renal.      Code Status: Full  Family Communication: Wife   Disposition Plan: Home vs SNF   Procedures CT scan abdomen pelvis, renal ultrasound   Consults nephrology, Urology   Medications  Scheduled Meds: . albuterol  3 mL Nebulization TID  . amLODipine  5 mg Oral QHS  . atorvastatin  10 mg Oral q1800  . bisacodyl  10 mg Rectal Daily  . cefTRIAXone (ROCEPHIN)  IV  1 g Intravenous Q24H  . docusate sodium  200 mg Oral BID  . doxazosin  4 mg Oral Daily  . fluticasone  1 spray Each Nare Daily  . furosemide  80 mg Oral BID  . methylPREDNISolone (SOLU-MEDROL) injection  60 mg Intravenous Q12H  . metoprolol  12.5 mg Oral BID  . mometasone-formoterol  2 puff Inhalation BID  . polyethylene glycol  17 g Oral BID  . sodium bicarbonate  1,300 mg Oral BID  . tiotropium  18 mcg Inhalation Daily   Continuous Infusions:   PRN Meds:.albuterol, HYDROcodone-acetaminophen, ondansetron (ZOFRAN) IV  DVT Prophylaxis SCDs    Lab Results  Component Value Date   PLT 268 04/19/2014  Antibiotics     Anti-infectives   Start     Dose/Rate Route Frequency Ordered Stop   04/14/14 1300  cefTRIAXone (ROCEPHIN) 1 g in dextrose 5 % 50 mL IVPB     1 g 100 mL/hr over 30 Minutes Intravenous Every 24 hours 04/14/14 1158            Objective:   Filed Vitals:   04/19/14 0542 04/19/14 0553 04/19/14 0612 04/19/14 0831  BP: 146/59   158/69  Pulse: 87   93  Temp: 97.9 F (36.6 C)   97.9 F (36.6 C)  TempSrc: Oral   Oral  Resp: 18   18  Height:      Weight:  102.195 kg (225 lb 4.8 oz) 101.606 kg (224 lb)   SpO2: 94%   94%    Wt Readings from Last 3 Encounters:  04/19/14 101.606 kg (224 lb)  12/01/13 88.905 kg (196 lb)  11/02/13 88.451  kg (195 lb)     Intake/Output Summary (Last 24 hours) at 04/19/14 0913 Last data filed at 04/19/14 0908  Gross per 24 hour  Intake    970 ml  Output   4425 ml  Net  -3455 ml     Physical Exam  Awake Alert, Oriented X 3, No new F.N deficits, Normal affect [place].AT,PERRAL Supple Neck,No JVD, No cervical lymphadenopathy appriciated.  Symmetrical Chest wall movement, Good air movement bilaterally, corse bilateral breath sounds with +ve wheezing  RRR,No Gallops,Rubs or new Murmurs, No Parasternal Heave +ve B.Sounds, Abd soft, no L flank tenderness, No organomegaly appriciated, No rebound - guarding or rigidity. No Cyanosis, Clubbing trace to no edema, No new Rash or bruise     Data Review   Micro Results No results found for this or any previous visit (from the past 240 hour(s)).  Radiology Reports Ct Abdomen Pelvis Wo Contrast  04/12/2014   CLINICAL DATA:  Left flank pain all day, no history of kidney stones, stage III renal failure  EXAM: CT ABDOMEN AND PELVIS WITHOUT CONTRAST  TECHNIQUE: Multidetector CT imaging of the abdomen and pelvis was performed following the standard protocol without IV contrast.  COMPARISON:  None.  FINDINGS: Lung bases are unremarkable. The study is limited without IV contrast. Unenhanced liver shows no biliary ductal dilatation.  No calcified gallstones are noted within gallbladder. Unenhanced pancreas spleen and adrenal glands are unremarkable. Extensive atherosclerotic calcifications of abdominal aorta and iliac arteries. No aortic aneurysm.  Unenhanced kidneys shows a lobulated contour. Multiple exophytic lesions are noted bilateral kidneys. This may represent renal cyst but cannot be characterized without IV contrast. A probable cyst in lower pole of the right kidney measures 1.2 cm. Probable cyst in upper pole of the right kidney measures 1.4 cm. There are few high density exophytic lesions. This may represent hemorrhagic or proteinaceous cyst. The largest  in upper pole of the right kidney measures 8 mm. The largest high density lesion in upper pole of the left kidney measures 8 mm.  There is a central parapelvic cyst in midpole of the left kidney with peripheral high-density material. This measures 3.4 x 2.8 cm. Some of the high density material is extending in central calyces of the lower pole of the left kidney as seen in axial image 61. Findings may represent hemorrhagic products within cyst or cystic neoplasm. Further correlation with enhanced study and/or urology exam is recommended.  No nephrolithiasis. No calcified ureteral calculi. Oral contrast material was given to the patient. No small bowel or  colonic obstruction. Stool noted in right colon and transverse colon. There is no pericecal inflammation. Sigmoid colon diverticula are noted. No evidence of acute diverticulitis. Prostate gland and seminal vesicles are unremarkable. No calcified calculi are noted within urinary bladder. There is a small left hydrocele.  IMPRESSION: 1. Limited study without IV contrast. There is a central parapelvic cyst in midpole of the left kidney with peripheral high-density material. This measures 3.4 x 2.8 cm. Some of the high density material is extending in central calyces of the lower pole of the left kidney as seen in axial image 61. Findings may represent hemorrhagic products within cyst or cystic neoplasm. Further correlation with enhanced study and/or urology exam is recommended. 2. No nephrolithiasis. No calcified ureteral calculi. No hydronephrosis or hydroureter. 3. Multiple exophytic lesions bilateral kidney cannot be characterized without IV contrast. Although this may represent cysts some of the lesions are high density. Hemorrhagic or proteinaceous cysts cannot be excluded. 4. No calcified calculi are noted within urinary bladder. 5. Sigmoid colon diverticula are noted. No evidence of acute diverticulitis. 6. Small left hydrocele.   Electronically Signed   By:  Lahoma Crocker M.D.   On: 04/12/2014 22:25   Dg Chest Portable 1 View  04/12/2014   CLINICAL DATA:  Left flank pain all day. Kidney problems. Abdominal pain.  EXAM: PORTABLE CHEST - 1 VIEW  COMPARISON:  11/29/2012  FINDINGS: The heart size and mediastinal contours are within normal limits. Both lungs are clear. The visualized skeletal structures are unremarkable.  IMPRESSION: No active disease.   Electronically Signed   By: Lucienne Capers M.D.   On: 04/12/2014 21:08     CBC  Recent Labs Lab 04/12/14 1859  04/14/14 0323 04/14/14 1507 04/15/14 0350 04/16/14 0650 04/18/14 1211 04/19/14 0445  WBC 12.8*  < > 16.2*  --  14.0* 11.1* 9.8 9.3  HGB 10.9*  < > 9.7* 9.2* 8.6* 8.7* 9.1* 8.7*  HCT 31.3*  < > 29.5* 27.3* 26.0* 25.7* 26.4* 24.7*  PLT 233  < > 217  --  199 209 269 268  MCV 86.5  < > 91.0  --  88.7 89.5 87.4 84.9  MCH 30.1  < > 29.9  --  29.4 30.3 30.1 29.9  MCHC 34.8  < > 32.9  --  33.1 33.9 34.5 35.2  RDW 12.9  < > 13.6  --  13.4 13.3 13.1 12.9  LYMPHSABS 1.1  --   --   --   --   --  0.4*  --   MONOABS 0.8  --   --   --   --   --  0.4  --   EOSABS 0.2  --   --   --   --   --  0.0  --   BASOSABS 0.0  --   --   --   --   --  0.0  --   < > = values in this interval not displayed.  Chemistries   Recent Labs Lab 04/12/14 1859  04/15/14 0350 04/16/14 0650 04/17/14 0411 04/18/14 0500 04/19/14 0445  NA 132*  < > 130* 128* 125* 127* 130*  K 5.5*  < > 4.7 4.4 5.2 5.0 4.7  CL 97  < > 96 93* 89* 91* 93*  CO2 22  < > 19 17* 16* 17* 19  GLUCOSE 121*  < > 79 106* 149* 161* 166*  BUN 49*  < > 56* 68* 79* 88* 91*  CREATININE 2.63*  < >  3.92* 4.25* 4.29* 4.26* 3.91*  CALCIUM 9.2  < > 7.5* 7.7* 8.0* 8.1* 8.0*  AST 20  --   --   --   --   --  18  ALT 15  --   --   --   --   --  20  ALKPHOS 58  --   --   --   --   --  51  BILITOT 0.2*  --   --   --   --   --  <0.2*  < > = values in this interval not  displayed. ------------------------------------------------------------------------------------------------------------------ estimated creatinine clearance is 18.6 ml/min (by C-G formula based on Cr of 3.91). ------------------------------------------------------------------------------------------------------------------ No results found for this basename: HGBA1C,  in the last 72 hours ------------------------------------------------------------------------------------------------------------------ No results found for this basename: CHOL, HDL, LDLCALC, TRIG, CHOLHDL, LDLDIRECT,  in the last 72 hours ------------------------------------------------------------------------------------------------------------------ No results found for this basename: TSH, T4TOTAL, FREET3, T3FREE, THYROIDAB,  in the last 72 hours ------------------------------------------------------------------------------------------------------------------ No results found for this basename: VITAMINB12, FOLATE, FERRITIN, TIBC, IRON, RETICCTPCT,  in the last 72 hours  Coagulation profile  Recent Labs Lab 04/13/14 0421  INR 1.05    No results found for this basename: DDIMER,  in the last 72 hours  Cardiac Enzymes No results found for this basename: CK, CKMB, TROPONINI, MYOGLOBIN,  in the last 168 hours ------------------------------------------------------------------------------------------------------------------ No components found with this basename: POCBNP,      Time Spent in minutes   35   SINGH,PRASHANT K M.D on 04/19/2014 at 9:13 AM  Between 7am to 7pm - Pager - [phone]  After 7pm go to [email or web address] - password [email or web address]  And look for the night coverage person covering for me after hours  [facility] Office  [phone]

## 2014-04-19 NOTE — Progress Notes (Signed)
Agree with change in D/C disposition due to progress with mobility and 24/7 (A) available. [place], [place], [place]

## 2014-04-20 ENCOUNTER — Other Ambulatory Visit: Payer: Self-pay | Admitting: Urology

## 2014-04-20 ENCOUNTER — Encounter (HOSPITAL_COMMUNITY): Payer: Self-pay | Admitting: *Deleted

## 2014-04-20 LAB — BASIC METABOLIC PANEL
ANION GAP: 15 (ref 5–15)
BUN: 87 mg/dL — ABNORMAL HIGH (ref 6–23)
CALCIUM: 8 mg/dL — AB (ref 8.4–10.5)
CO2: 23 meq/L (ref 19–32)
CREATININE: 3.39 mg/dL — AB (ref 0.50–1.35)
Chloride: 96 mEq/L (ref 96–112)
GFR calc Af Amer: 19 mL/min — ABNORMAL LOW (ref >90)
GFR calc non Af Amer: 16 mL/min — ABNORMAL LOW (ref >90)
Glucose, Bld: 164 mg/dL — ABNORMAL HIGH (ref 70–99)
Potassium: 4.6 mEq/L (ref 3.7–5.3)
Sodium: 134 mEq/L — ABNORMAL LOW (ref 137–147)

## 2014-04-20 MED ORDER — ALBUTEROL SULFATE (2.5 MG/3ML) 0.083% IN NEBU: 3.0000 mL | INHALATION_SOLUTION | RESPIRATORY_TRACT | Status: DC | PRN

## 2014-04-20 MED ORDER — ALBUTEROL SULFATE (2.5 MG/3ML) 0.083% IN NEBU: 3.0000 mL | INHALATION_SOLUTION | Freq: Two times a day (BID) | RESPIRATORY_TRACT | Status: DC

## 2014-04-20 MED ORDER — CEFUROXIME AXETIL 500 MG PO TABS: 500.0000 mg | ORAL_TABLET | Freq: Two times a day (BID) | ORAL | Status: DC

## 2014-04-20 MED ORDER — PREDNISONE 50 MG PO TABS
50.0000 mg | ORAL_TABLET | Freq: Every day | ORAL | Status: DC
  Administered 2014-04-20: 50 mg via ORAL
  Filled 2014-04-20 (×2): qty 1

## 2014-04-20 NOTE — Progress Notes (Signed)
Patient ID: Terry [name], male    DOB: [birth year], 79 y.o.   MRN: [id number] Nephrology Progress Note  S: No complaints  O:BP 137/57  Pulse 83  Temp(Src) 98.4 F (36.9 C) (Axillary)  Resp 20  Ht 5\' 8"  (1.727 m)  Wt 224 lb (101.606 kg)  BMI 34.07 kg/m2  SpO2 95%  Intake/Output Summary (Last 24 hours) at 04/20/14 0819 Last data filed at 04/20/14 0606  Gross per 24 hour  Intake   1000 ml  Output   5025 ml  Net  -4025 ml   Intake/Output: I/O last 3 completed shifts: In: 1000 [P.O.:1000] Out: 7575 [Urine:7575]  Intake/Output this shift:    Weight change:  Gen: NAD CVS: RRR, 2/6 systolic murmur Resp: wheezes throughout, diminished BS Abd: soft, obese, +BS Ext: edema 2+ LE   Recent Labs Lab 04/14/14 0323 04/15/14 0350 04/16/14 0650 04/17/14 0411 04/18/14 0500 04/19/14 0445 04/20/14 0433  NA 134* 130* 128* 125* 127* 130* 134*  K 5.3 4.7 4.4 5.2 5.0 4.7 4.6  CL 99 96 93* 89* 91* 93* 96  CO2 19 19 17* 16* 17* 19 23  GLUCOSE 97 79 106* 149* 161* 166* 164*  BUN 55* 56* 68* 79* 88* 91* 87*  CREATININE 3.70* 3.92* 4.25* 4.29* 4.26* 3.91* 3.39*  ALBUMIN  --   --   --   --   --  2.9*  --   CALCIUM 8.0* 7.5* 7.7* 8.0* 8.1* 8.0* 8.0*  PHOS  --   --   --   --   --  7.0*  --   AST  --   --   --   --   --  18  --   ALT  --   --   --   --   --  20  --    Liver Function Tests:  Recent Labs Lab 04/19/14 0445  AST 18  ALT 20  ALKPHOS 51  BILITOT <0.2*  PROT 6.1  ALBUMIN 2.9*   No results found for this basename: LIPASE, AMYLASE,  in the last 168 hours No results found for this basename: AMMONIA,  in the last 168 hours CBC:  Recent Labs Lab 04/14/14 0323  04/15/14 0350 04/16/14 0650 04/18/14 1211 04/19/14 0445  WBC 16.2*  --  14.0* 11.1* 9.8 9.3  NEUTROABS  --   --   --   --  9.1*  --   HGB 9.7*  < > 8.6* 8.7* 9.1* 8.7*  HCT 29.5*  < > 26.0* 25.7* 26.4* 24.7*  MCV 91.0  --  88.7 89.5 87.4 84.9  PLT 217  --  199 209 269 268  < > = values in this interval not  displayed. Cardiac Enzymes: No results found for this basename: CKTOTAL, CKMB, CKMBINDEX, TROPONINI,  in the last 168 hours CBG: No results found for this basename: GLUCAP,  in the last 168 hours  Iron Studies: No results found for this basename: IRON, TIBC, TRANSFERRIN, FERRITIN,  in the last 72 hours Studies/Results: No results found. [name] albuterol  3 mL Nebulization TID  . amLODipine  5 mg Oral QHS  . atorvastatin  10 mg Oral q1800  . bisacodyl  10 mg Rectal Daily  . cefUROXime  500 mg Oral BID WC  . docusate sodium  200 mg Oral BID  . doxazosin  4 mg Oral Daily  . fluticasone  1 spray Each Nare Daily  . furosemide  80 mg Oral BID  . metoprolol  12.5 mg Oral BID  . mometasone-formoterol  2 puff Inhalation BID  . polyethylene glycol  17 g Oral BID  . predniSONE  50 mg Oral Q breakfast  . sodium bicarbonate  1,300 mg Oral BID  . tiotropium  18 mcg Inhalation Daily    BMET    Component Value Date/Time   NA 134* 04/20/2014 0433   K 4.6 04/20/2014 0433   CL 96 04/20/2014 0433   CO2 23 04/20/2014 0433   GLUCOSE 164* 04/20/2014 0433   BUN 87* 04/20/2014 0433   CREATININE 3.39* 04/20/2014 0433   CALCIUM 8.0* 04/20/2014 0433   GFRNONAA 16* 04/20/2014 0433   GFRAA 19* 04/20/2014 0433   CBC    Component Value Date/Time   WBC 9.3 04/19/2014 0445   RBC 2.91* 04/19/2014 0445   HGB 8.7* 04/19/2014 0445   HCT 24.7* 04/19/2014 0445   PLT 268 04/19/2014 0445   MCV 84.9 04/19/2014 0445   MCH 29.9 04/19/2014 0445   MCHC 35.2 04/19/2014 0445   RDW 12.9 04/19/2014 0445   LYMPHSABS 0.4* 04/18/2014 1211   MONOABS 0.4 04/18/2014 1211   EOSABS 0.0 04/18/2014 1211   BASOSABS 0.0 04/18/2014 1211    Assessment/Plan: 1. AKI on CKD 3-4. Good UOP with addition of lasix. On oral bicarb, acidemia resolved. Scr continues to trend down with good diuresis. 2. Anemia 3. CKD-MBD: elevated phos 4. Obstructive uropathy needs Urology to see and cont foley 5. Renal cyst  rupture 6. COPD 7. Obesity P d/c today, send home on lasix 80mg  daily, discontinue amlodipine. F/u with Dr. Joelyn Oms in 2-4 weeks.  Tawanna Sat 8:19 AM I have seen and examined this patient and agree with the plan of care seen , eval,examined, and discussed wth patient and residents. .  Annalysse Shoemaker,Jaree L 04/20/2014, 10:59 AM

## 2014-04-20 NOTE — Progress Notes (Signed)
Sheral Flow to be D/C'd Home with home health and home O2 per MD order.  Discussed with the patient and all questions fully answered.  VSS, Skin clean, dry and intact without evidence of skin break down, no evidence of skin tears noted. IV catheter discontinued intact. Site without signs and symptoms of complications. Dressing and pressure applied.  An After Visit Summary was printed and given to the patient. Patient received prescriptions and was educated regarding importance of taking all medications as prescribed.  O2 tank delivered to patient prior to d/c.  D/C education completed with patient/family including follow up instructions, medication list, d/c activities limitations if indicated, with other d/c instructions as indicated by MD - patient able to verbalize understanding, all questions fully answered.   Patient instructed to return to ED, call 911, or call MD for any changes in condition.   Patient escorted via [place], and D/C home via private auto.  Micki Riley 04/20/2014 11:44 AM

## 2014-04-20 NOTE — Discharge Summary (Addendum)
Terry [name], is a 79 y.o. male  DOB Mar 04, 1935  MRN [id number].  Admission date:  04/12/2014  Admitting Physician  Ivor Costa, MD  Discharge Date:  04/20/2014   Primary MD  [name], DO  Recommendations for primary care physician for things to follow:   Check CBC, BMP 2 view CXR in 3-4 days. Please review CT scan report, follow as clinically appropriate.   Admission Diagnosis  Chronic kidney disease, stage III (moderate) [N18.3] Hyperkalemia [E87.5] Left flank pain [R10.9] Cyst of left kidney [Q61.00] COPD mixed type [J44.9] Essential hypertension [I10] Abdominal pain [R10.9]   Discharge Diagnosis  Chronic kidney disease, stage III (moderate) [N18.3] Hyperkalemia [E87.5] Left flank pain [R10.9] Cyst of left kidney [Q61.00] COPD mixed type [J44.9] Essential hypertension [I10] Abdominal pain [R10.9]    Principal Problem:   Left flank pain Active Problems:   COPD GOLD III   Chronic kidney disease, stage III (moderate)   CAD (coronary artery disease) of artery bypass graft   HTN (hypertension)   Hyperkalemia      Past Medical History  Diagnosis Date  . Stroke 2012  . High cholesterol   . Hypertension   . Kidney disease   . COPD (chronic obstructive pulmonary disease)   . CAD (coronary artery disease) [place], [name]  . Acute and chronic respiratory failure 10/22/2012  . Osteoarthritis 10/22/2012  . Migraine     Past Surgical History  Procedure Laterality Date  . Coronary stent placement    . Appendectomy  1984  . Colon surgery  2012    partial colon removed - twisted bowel       History of present illness and  Hospital Course:     Kindly see H&P for history of present illness and admission details, please review complete Labs, Consult reports and Test reports for all details in  brief  HPI  from the history and physical done on the day of admission   Terry [name] is a 79 y.o. male with past medical history of coronary artery disease, s/p of stent placement, hyperlipidemia, hypertension, COPD, chronic kidney disease-stage III, who presents with left flank pain.   Patient reports that at about 2 PM yesterday, he started having left flank pain. It has been progressively getting worse. It is constant, radiating to the front of his abdomen, "15/10 in severity". He does not have fever, chills, nausea, vomiting, diarrhea. He does not have shortness of breath, chest pain. He did not notice any blood in his urine or stool.   Ct-abd/pelvis suggested ruptured cyst or cystic neoplasm and no nephrolithiasis. He is admitted to inpatient for further evaluation and treatment. ED physician spoke to urologist, who suggested conservative treatment and pain control tonight. Patient has ypokalemia with a potassium of 5.5 on admission.    Hospital Course     1. Left flank pain - due to hemorrhage and a renal cyst repeat CT confirms, seen by urologist D/W Dr Alinda Money on 04-14-14, likely cystoscopy outpatient, will  continue to follow closely his symptoms and renal function. CT showed some perinephric stranding on the left side, had ++ leukocytosis resolved on empiric IV Rocephin will switch to 4 more days of PO Ceftin on 04-20-14, follow with PCP, renal and urology.   2. Acute renal failure on top of chronic disease stage IV. Baseline creatinine around 2. Also had some bladder outlet obstruction. Foley was placed, he was hydrated, ACE inhibitor and Lasix held. Renal function has steadily improved. Appreciate nephrology input. We will follow post discharge with nephrology and PCP.   3. Hyponatremia - Per renal. Improving with self diuresis. Repeat BMP in 3-4 days.   4. Hypertension. Stable on beta blocker and Norvasc continue. ACE and Lasix discontinued due to acute renal failure.   5.  CAD. No acute issue. Continue beta blocker, hold aspirin for now.    6.Constipation. Placed on bowel regimen and monitor.    7. Shortness of breath. Likely due to combination of mild fluid overload and questionable COPD exacerbation, much improved. The fluids were held and he is being self diuresing, also after a trial of IV steroids. Still requiring 2 L of nasal cannula oxygen down to 5. Much improved after pulmonary toiletry, he be discharged with nebulizer treatments on a steroid taper and home oxygen. Steadily improving with ongoing self diuresis.   8. Bladder outlet Obstruction - foley, alpha blocker, uro follow up, will go with foley per renal.       Discharge Condition: stable   Follow UP  Follow-up Information   Follow up with [facility]. (If symptoms worsen)    Specialty:  Emergency Medicine   Contact information:   [id number] [place] [address] [phone]      Follow up with Dutch Gray, MD. (Will call to schedule outpatient surgical procedure for 1-2 weeks)    Specialty:  Urology   Contact information:   [place] [address] [phone]       Follow up with Colin Benton R., DO. Schedule an appointment as soon as possible for a visit in 3 days.   Specialty:  Family Medicine   Contact information:   [name] [address] [phone]       Follow up with DETERDING,Danyon L, MD. Schedule an appointment as soon as possible for a visit in 1 week.   Specialty:  Nephrology   Contact information:   [place] [address] [phone]         Discharge Instructions  and  Discharge Medications          Discharge Instructions   Discharge instructions    Complete by:  As directed   Follow with Primary MD Colin Benton R., DO in 3-4 days   Get CBC, CMP, 2 view Chest X ray checked  by Primary MD next visit.    Activity: As tolerated with  Full fall precautions use walker/cane & assistance as needed   Disposition Home     Diet: Heart Healthy Low Carb - Check your Weight same time everyday, if you gain over 2 pounds, or you develop in leg swelling, experience more shortness of breath or chest pain, call your Primary MD immediately. Follow Cardiac Low Salt Diet and 1.5 lit/day fluid restriction.   On your next visit with your primary care physician please Get Medicines reviewed and adjusted.   Please request your Prim.MD to go over all Hospital Tests and Procedure/Radiological results  at the follow up, please get all Hospital records sent to your Prim MD by signing hospital release before you go home.   If you experience worsening of your admission symptoms, develop shortness of breath, life threatening emergency, suicidal or homicidal thoughts you must seek medical attention immediately by calling 911 or calling your MD immediately  if symptoms less severe.  You Must read complete instructions/literature along with all the possible adverse reactions/side effects for all the Medicines you take and that have been prescribed to you. Take any new Medicines after you have completely understood and accpet all the possible adverse reactions/side effects.   Do not drive, operating heavy machinery, perform activities at heights, swimming or participation in water activities or provide baby sitting services if your were admitted for syncope or siezures until you have seen by Primary MD or a Neurologist and advised to do so again.  Do not drive when taking Pain medications.    Do not take more than prescribed Pain, Sleep and Anxiety Medications  Special Instructions: If you have smoked or chewed Tobacco  in the last 2 yrs please stop smoking, stop any regular Alcohol  and or any Recreational drug use.  Wear Seat belts while driving.   Please note  You were cared for by a hospitalist during your hospital stay. If you have any  questions about your discharge medications or the care you received while you were in the hospital after you are discharged, you can call the unit and asked to speak with the hospitalist on call if the hospitalist that took care of you is not available. Once you are discharged, your primary care physician will handle any further medical issues. Please note that NO REFILLS for any discharge medications will be authorized once you are discharged, as it is imperative that you return to your primary care physician (or establish a relationship with a primary care physician if you do not have one) for your aftercare needs so that they can reassess your need for medications and monitor your lab values.     Increase activity slowly    Complete by:  As directed             Medication List    STOP taking these medications       furosemide 20 MG tablet  Commonly known as:  LASIX     losartan 100 MG tablet  Commonly known as:  COZAAR      TAKE these medications       albuterol 108 (90 BASE) MCG/ACT inhaler  Commonly known as:  PROVENTIL HFA;VENTOLIN HFA  Inhale 2 puffs into the lungs every 6 (six) hours as needed for wheezing or shortness of breath.     albuterol (2.5 MG/3ML) 0.083% nebulizer solution  Commonly known as:  PROVENTIL  Take 3 mLs by nebulization every 4 (four) hours as needed for wheezing or shortness of breath.     amLODipine 10 MG tablet  Commonly known as:  NORVASC  Take 1 tablet (10 mg total) by mouth every morning.     aspirin EC 81 MG tablet  Take 81 mg by mouth daily.     atorvastatin 10 MG tablet  Commonly known as:  LIPITOR  TAKE ONE TABLET BY MOUTH ONCE DAILY     cefUROXime 500 MG tablet  Commonly known as:  CEFTIN  Take 1 tablet (500 mg total) by mouth 2 (two) times daily with a meal.     doxazosin 4 MG tablet  Commonly known as:  CARDURA  TAKE ONE TABLET BY MOUTH ONCE DAILY     DSS 100 MG Caps  Take 200 mg by mouth 2 (two) times daily as needed for mild  constipation.     fluticasone-salmeterol 115-21 MCG/ACT inhaler  Commonly known as:  ADVAIR HFA  Inhale 2 puffs into the lungs 2 (two) times daily.     HYDROcodone-acetaminophen 5-325 MG per tablet  Commonly known as:  NORCO/VICODIN  Take 1 tablet by mouth every 6 (six) hours as needed for moderate pain.     metoprolol 100 MG tablet  Commonly known as:  LOPRESSOR  Take 1 tablet (100 mg total) by mouth daily.     polyethylene glycol packet  Commonly known as:  MIRALAX / GLYCOLAX  Take 17 g by mouth 2 (two) times daily.     predniSONE 5 MG tablet  Commonly known as:  DELTASONE  Label  & dispense according to the schedule below. 10 Pills PO for 3 days then, 8 Pills PO for 3 days, 6 Pills PO for 3 days, 4 Pills PO for 3 days, 2 Pills PO for 3 days, 1 Pills PO for 3 days, 1/2 Pill  PO for 3 days then STOP. Total 95 pills.     tiotropium 18 MCG inhalation capsule  Commonly known as:  SPIRIVA  Place 1 capsule (18 mcg total) into inhaler and inhale daily.          Diet and Activity recommendation: See Discharge Instructions above   Consults obtained - urology, renal   Major procedures and Radiology Reports - PLEASE review detailed and final reports for all details, in brief -       Ct Abdomen Pelvis Wo Contrast  04/14/2014   CLINICAL DATA:  Ultrasound showed a complicated cyst on the right for which follow-up is recommended. Patient also has abdominal pain.  EXAM: CT ABDOMEN AND PELVIS WITHOUT CONTRAST  TECHNIQUE: Multidetector CT imaging of the abdomen and pelvis was performed following the standard protocol without IV contrast.  COMPARISON:  Renal ultrasound, 04/13/2014. Abdominal CT, 04/12/2014.  FINDINGS: The presumed renal sinus cyst noted on the prior CT, arising from the left kidney, is now hyper attenuating with average Hounsfield units of 58. This is consistent with acute hemorrhage. Small focus of hyperattenuation is noted associated with a small mass in the left kidney  upper pole, likely a small hemorrhagic cyst. This is stable. There is new perinephric stranding on the left extending along the proximal left ureter. The ureter is normal course and caliber with no stones. Multiple small bilateral renal masses are noted. Several show increased attenuation. These are likely combination of simple and hemorrhagic cysts. These are similar to the prior CT.  There is lung base opacity mostly dependently in the lower lobes, right greater than left, consistent with atelectasis, mildly increased from the prior study.  Small low-density lesions in noted near the dome of the left liver lobe, stable, likely cysts. Liver otherwise unremarkable.  Small splenic calcifications consistent healed granuloma. Spleen otherwise normal.  Gallbladder and pancreas are unremarkable. No bile duct dilation. No adrenal mass.  Bladder is unremarkable.  Dense calcification is noted along mildly ectatic abdominal aorta and its branch vessels.  No pathologically enlarged lymph nodes. There are scattered prominent gastrohepatic ligament and retroperitoneal lymph nodes.  There are numerous colonic diverticula most prominent along the sigmoid colon. Small area of inflammatory type changes noted in the fat along the superior margin of the lower sigmoid  colon adjacent to several diverticula. This is also adjacent to the distal right ureter. This is most likely related to the left renal pathology. Colon otherwise unremarkable. Normal small bowel.  Bony structures are diffusely demineralized. There are degenerative changes of the visualized spine. No osteoblastic or osteolytic lesions.  IMPRESSION: 1. Since the prior CT, the presumed left renal sinus cyst has hemorrhaged. It is now hyper attenuating consistent with acute blood. The cause of the hemorrhage is unclear. There is associated perinephric stranding extending along the left ureter. The left ureter is normal in course and in caliber. There is no evidence of  obstructive uropathy. 2. No other convincing acute findings. 3. Other renal masses seen bilaterally appear to be a combination of simple cysts and cyst complicated by previous hemorrhage. These lesions are not fully characterized on the unenhanced CT imaging performed currently and previously or on the ultrasound. More comprehensive characterization of these lesions would be best performed with renal MRI with and without contrast if the patient can tolerate that procedure. 4. Multiple chronic findings as described.   Electronically Signed   By: Lajean Manes M.D.   On: 04/14/2014 11:50   Ct Abdomen Pelvis Wo Contrast  04/12/2014   CLINICAL DATA:  Left flank pain all day, no history of kidney stones, stage III renal failure  EXAM: CT ABDOMEN AND PELVIS WITHOUT CONTRAST  TECHNIQUE: Multidetector CT imaging of the abdomen and pelvis was performed following the standard protocol without IV contrast.  COMPARISON:  None.  FINDINGS: Lung bases are unremarkable. The study is limited without IV contrast. Unenhanced liver shows no biliary ductal dilatation.  No calcified gallstones are noted within gallbladder. Unenhanced pancreas spleen and adrenal glands are unremarkable. Extensive atherosclerotic calcifications of abdominal aorta and iliac arteries. No aortic aneurysm.  Unenhanced kidneys shows a lobulated contour. Multiple exophytic lesions are noted bilateral kidneys. This may represent renal cyst but cannot be characterized without IV contrast. A probable cyst in lower pole of the right kidney measures 1.2 cm. Probable cyst in upper pole of the right kidney measures 1.4 cm. There are few high density exophytic lesions. This may represent hemorrhagic or proteinaceous cyst. The largest in upper pole of the right kidney measures 8 mm. The largest high density lesion in upper pole of the left kidney measures 8 mm.  There is a central parapelvic cyst in midpole of the left kidney with peripheral high-density material.  This measures 3.4 x 2.8 cm. Some of the high density material is extending in central calyces of the lower pole of the left kidney as seen in axial image 61. Findings may represent hemorrhagic products within cyst or cystic neoplasm. Further correlation with enhanced study and/or urology exam is recommended.  No nephrolithiasis. No calcified ureteral calculi. Oral contrast material was given to the patient. No small bowel or colonic obstruction. Stool noted in right colon and transverse colon. There is no pericecal inflammation. Sigmoid colon diverticula are noted. No evidence of acute diverticulitis. Prostate gland and seminal vesicles are unremarkable. No calcified calculi are noted within urinary bladder. There is a small left hydrocele.  IMPRESSION: 1. Limited study without IV contrast. There is a central parapelvic cyst in midpole of the left kidney with peripheral high-density material. This measures 3.4 x 2.8 cm. Some of the high density material is extending in central calyces of the lower pole of the left kidney as seen in axial image 61. Findings may represent hemorrhagic products within cyst or cystic neoplasm. Further correlation with  enhanced study and/or urology exam is recommended. 2. No nephrolithiasis. No calcified ureteral calculi. No hydronephrosis or hydroureter. 3. Multiple exophytic lesions bilateral kidney cannot be characterized without IV contrast. Although this may represent cysts some of the lesions are high density. Hemorrhagic or proteinaceous cysts cannot be excluded. 4. No calcified calculi are noted within urinary bladder. 5. Sigmoid colon diverticula are noted. No evidence of acute diverticulitis. 6. Small left hydrocele.   Electronically Signed   By: Lahoma Crocker M.D.   On: 04/12/2014 22:25   Dg Chest 2 View  04/16/2014   CLINICAL DATA:  Chronic shortness of breath increased today, personal history of COPD, coronary artery disease, hypertension, stroke, former smoker  EXAM: CHEST   2 VIEW  COMPARISON:  04/14/2014  FINDINGS: Lordotic positioning.  Borderline enlargement of cardiac silhouette.  Atherosclerotic calcification aorta.  Mediastinal contours and pulmonary vascularity normal.  Emphysematous and minimal bronchitic changes.  Mild RIGHT basilar atelectasis.  No acute infiltrate, pleural effusion or pneumothorax.  Mild scattered endplate spur formation thoracic spine.  IMPRESSION: Borderline enlargement of cardiac silhouette.  COPD changes with RIGHT basilar atelectasis.   Electronically Signed   By: Lavonia Dana M.D.   On: 04/16/2014 10:06   Dg Chest 2 View  04/14/2014   CLINICAL DATA:  Cough. History of smoking in the past. History of hypertension and COPD. History of coronary artery disease.  EXAM: CHEST  2 VIEW  COMPARISON:  04/12/2014.  FINDINGS: Cardiac silhouette is normal in size. Normal mediastinal and hilar contours.  Clear lungs.  No pleural effusion or pneumothorax.  Bony thorax is demineralized but intact.  IMPRESSION: No acute cardiopulmonary disease.   Electronically Signed   By: Lajean Manes M.D.   On: 04/14/2014 11:19   US Renal  04/16/2014   CLINICAL DATA:  Hypertension.  Kidney disease.  EXAM: RENAL/URINARY TRACT ULTRASOUND COMPLETE  COMPARISON:  CT 04/14/2014.  FINDINGS: Right Kidney:  Length: 12.6 cm. Echogenicity within normal limits. No prominent mass or hydronephrosis visualized. Multiple cysts some of which may be complex, largest measures 1 cm.  Left Kidney:  Length: 14.0 cm. Echogenicity within normal limits. No hydronephrosis visualized. Innumerable cysts noted. A large 4.4 x 3.1 x 2.4 cm complex cystic structure noted in the renal hilum consistent with previously identified hemorrhagic cyst.  As noted on prior CT report 04/14/2014 further evaluation of patient's bilateral renal cysts would best be evaluated with nonenhanced and enhanced renal MRI .  Bladder:  Patient has a Foley catheter.  Bladder not visualized.  IMPRESSION: 1. Large 4.4 x 3.1 x 2.4 cm  complex cystic structure in the left renal hilum in the region previously identified hemorrhagic cyst. 2. Innumerable bilateral renal cysts. As noted on prior CT report 04/14/2014 the patient renal cystic lesions including the large hemorrhagic cyst in the left kidney would best be evaluated by nonenhanced and enhanced renal MRI.   Electronically Signed   By: Marcello Moores  Register   On: 04/16/2014 16:07   US Renal  04/13/2014   CLINICAL DATA:  Acute renal failure.  EXAM: RENAL/URINARY TRACT ULTRASOUND COMPLETE  COMPARISON:  CT scan of April 12, 2014.  FINDINGS: Right Kidney:  Length: 11.5 cm. Three rounded hypoechoic abnormalities are noted most consistent with cyst. The largest measures 1.8 cm. Echogenicity within normal limits. No mass or hydronephrosis visualized.  Left Kidney:  Length: 12.9 cm. Echogenicity within normal limits. Hypoechoic parapelvic abnormality is noted which measures 3.7 x 2.7 cm. It is not clearly a cyst by ultrasound  criteria, and therefore further evaluation with MRI or CT with contrast is recommended to rule out mass. No hydronephrosis visualized.  Bladder:  Limited visualization as patient was not cooperative with the exam.  IMPRESSION: Right renal cysts are noted.  3.7 cm parapelvic rounded abnormality is noted which is not clearly a cyst by ultrasound criteria. Further evaluation with MRI or CT scan with intravenous contrast is recommended to evaluate for possible neoplasm or malignancy.   Electronically Signed   By: Sabino Dick M.D.   On: 04/13/2014 15:34   Dg Chest Portable 1 View  04/12/2014   CLINICAL DATA:  Left flank pain all day. Kidney problems. Abdominal pain.  EXAM: PORTABLE CHEST - 1 VIEW  COMPARISON:  11/29/2012  FINDINGS: The heart size and mediastinal contours are within normal limits. Both lungs are clear. The visualized skeletal structures are unremarkable.  IMPRESSION: No active disease.   Electronically Signed   By: Lucienne Capers M.D.   On: 04/12/2014 21:08     Micro Results    No results found for this or any previous visit (from the past 240 hour(s)).     Today   Subjective:   Feliciano Babcock today has no headache,no chest abdominal pain,no new weakness tingling or numbness, feels much better wants to go home today.   Objective:   Blood pressure 137/57, pulse 83, temperature 98.4 F (36.9 C), temperature source Axillary, resp. rate 20, height 5\' 8"  (1.727 m), weight 101.606 kg (224 lb), SpO2 95.00%.   Intake/Output Summary (Last 24 hours) at 04/20/14 0936 Last data filed at 04/20/14 0845  Gross per 24 hour  Intake    680 ml  Output   4850 ml  Net  -4170 ml    Exam Awake Alert, Oriented x 3, No new F.N deficits, Normal affect [place].AT,PERRAL Supple Neck,No JVD, No cervical lymphadenopathy appriciated.  Symmetrical Chest wall movement, Good air movement bilaterally, CTAB RRR,No Gallops,Rubs or new Murmurs, No Parasternal Heave +ve B.Sounds, Abd Soft, Non tender, No organomegaly appriciated, No rebound -guarding or rigidity. No Cyanosis, Clubbing or edema, No new Rash or bruise  Data Review   CBC w Diff: Lab Results  Component Value Date   WBC 9.3 04/19/2014   HGB 8.7* 04/19/2014   HCT 24.7* 04/19/2014   PLT 268 04/19/2014   LYMPHOPCT 4* 04/18/2014   MONOPCT 4 04/18/2014   EOSPCT 0 04/18/2014   BASOPCT 0 04/18/2014    CMP: Lab Results  Component Value Date   NA 134* 04/20/2014   K 4.6 04/20/2014   CL 96 04/20/2014   CO2 23 04/20/2014   BUN 87* 04/20/2014   CREATININE 3.39* 04/20/2014   PROT 6.1 04/19/2014   ALBUMIN 2.9* 04/19/2014   BILITOT <0.2* 04/19/2014   ALKPHOS 51 04/19/2014   AST 18 04/19/2014   ALT 20 04/19/2014  .   Total Time in preparing paper work, data evaluation and todays exam - 35 minutes  Thurnell Lose M.D on 04/20/2014 at 9:36 AM  [facility] Office  [phone]

## 2014-04-20 NOTE — Plan of Care (Signed)
Problem: Phase II Progression Outcomes Goal: Obtain order to discontinue catheter if appropriate Outcome: Adequate for Discharge Per Dr. Candiss Norse, patient is to go home with foley catheter.

## 2014-04-20 NOTE — Discharge Instructions (Signed)
Follow with Primary MD Terry Benton R., DO in 3-4 days   Get CBC, CMP, 2 view Chest X ray checked  by Primary MD next visit.    Activity: As tolerated with Full fall precautions use walker/cane & assistance as needed   Disposition Home     Diet: Heart Healthy Low Carb - Check your Weight same time everyday, if you gain over 2 pounds, or you develop in leg swelling, experience more shortness of breath or chest pain, call your Primary MD immediately. Follow Cardiac Low Salt Diet and 1.5 lit/day fluid restriction.   On your next visit with your primary care physician please Get Medicines reviewed and adjusted.   Please request your Prim.MD to go over all Hospital Tests and Procedure/Radiological results at the follow up, please get all Hospital records sent to your Prim MD by signing hospital release before you go home.   If you experience worsening of your admission symptoms, develop shortness of breath, life threatening emergency, suicidal or homicidal thoughts you must seek medical attention immediately by calling 911 or calling your MD immediately  if symptoms less severe.  You Must read complete instructions/literature along with all the possible adverse reactions/side effects for all the Medicines you take and that have been prescribed to you. Take any new Medicines after you have completely understood and accpet all the possible adverse reactions/side effects.   Do not drive, operating heavy machinery, perform activities at heights, swimming or participation in water activities or provide baby sitting services if your were admitted for syncope or siezures until you have seen by Primary MD or a Neurologist and advised to do so again.  Do not drive when taking Pain medications.    Do not take more than prescribed Pain, Sleep and Anxiety Medications  Special Instructions: If you have smoked or chewed Tobacco  in the last 2 yrs please stop smoking, stop any regular Alcohol  and or any  Recreational drug use.  Wear Seat belts while driving.   Please note  You were cared for by a hospitalist during your hospital stay. If you have any questions about your discharge medications or the care you received while you were in the hospital after you are discharged, you can call the unit and asked to speak with the hospitalist on call if the hospitalist that took care of you is not available. Once you are discharged, your primary care physician will handle any further medical issues. Please note that NO REFILLS for any discharge medications will be authorized once you are discharged, as it is imperative that you return to your primary care physician (or establish a relationship with a primary care physician if you do not have one) for your aftercare needs so that they can reassess your need for medications and monitor your lab values.

## 2014-04-20 NOTE — Progress Notes (Signed)
Physical Therapy Treatment Patient Details Name: Terry [name] MRN: [id number] DOB: 12-17-1934 Today's Date: 04/20/2014    History of Present Illness Pt is 79 yo male admitted with left flank pain due to ruptured hemorrhagic renal cyst. Pt with acute on chronic CKD with worsening renal function and right basilar atelectasis. PMH includes HTN, CAD, COPD, CVA in 2012, acute on chronic respiratory failure 5/14, OA bilateral knees.    PT Comments    Patient agreeable to ambulation this morning. He is planning to DC home this afternoon and is following up with HHPT  Follow Up Recommendations  Home health PT;Supervision - Intermittent     Equipment Recommendations  Rolling walker with 5" wheels    Recommendations for Other Services       Precautions / Restrictions Precautions Precautions: Fall Precaution Comments: on 2L O2 throughout    Mobility  Bed Mobility Overal bed mobility: Modified Independent                Transfers Overall transfer level: Modified independent                  Ambulation/Gait Ambulation/Gait assistance: Supervision Ambulation Distance (Feet): 200 Feet Assistive device: Rolling walker (2 wheeled) Gait Pattern/deviations: Step-through pattern;Decreased stride length Gait velocity: decreased   General Gait Details: Patient with safe use of RW. No rest breaks required   Stairs            Wheelchair Mobility    Modified Rankin (Stroke Patients Only)       Balance                                    Cognition Arousal/Alertness: Awake/alert Behavior During Therapy: WFL for tasks assessed/performed Overall Cognitive Status: Within Functional Limits for tasks assessed                      Exercises      General Comments        Pertinent Vitals/Pain Pain Assessment: No/denies pain    Home Living                      Prior Function            PT Goals (current goals can now be  found in the care plan section) Progress towards PT goals: Progressing toward goals    Frequency  Min 3X/week    PT Plan Current plan remains appropriate    Co-evaluation             End of Session Equipment Utilized During Treatment: Gait belt;Oxygen Activity Tolerance: Patient tolerated treatment well Patient left: in chair;with call bell/phone within reach     Time: 1023-1047 PT Time Calculation (min): 24 min  Charges:  $Gait Training: 8-22 mins $Therapeutic Activity: 8-22 mins                    G Codes:      Jacqualyn Posey 04/20/2014, 1:01 PM 04/20/2014 Jacqualyn Posey PTA [phone] pager [phone] office

## 2014-04-20 NOTE — Progress Notes (Signed)
Called Dr. Lynne Logan office to ask if Terry [name] will still have his surgery on Monday, 04-23-14 or what will be his plan for surgery noticed that the plan is to discharge him today by the progress note. Thank you

## 2014-04-23 ENCOUNTER — Ambulatory Visit (HOSPITAL_COMMUNITY)
Admission: RE | Admit: 2014-04-23 | Discharge: 2014-04-23 | Disposition: A | Discharge status: Home / Self Care | Payer: Medicare PPO | Source: Ambulatory Visit | Attending: Urology | Admitting: Urology

## 2014-04-23 ENCOUNTER — Encounter (HOSPITAL_COMMUNITY)
Admission: RE | Disposition: A | Discharge status: Home / Self Care | Payer: Self-pay | Source: Ambulatory Visit | Attending: Urology

## 2014-04-23 ENCOUNTER — Ambulatory Visit (HOSPITAL_COMMUNITY): Payer: Medicare PPO | Admitting: Anesthesiology

## 2014-04-23 ENCOUNTER — Encounter (HOSPITAL_COMMUNITY): Payer: Self-pay | Admitting: *Deleted

## 2014-04-23 DIAGNOSIS — E780 Pure hypercholesterolemia: Secondary | ICD-10-CM | POA: Diagnosis not present

## 2014-04-23 DIAGNOSIS — I129 Hypertensive chronic kidney disease with stage 1 through stage 4 chronic kidney disease, or unspecified chronic kidney disease: Secondary | ICD-10-CM | POA: Insufficient documentation

## 2014-04-23 DIAGNOSIS — I2510 Atherosclerotic heart disease of native coronary artery without angina pectoris: Secondary | ICD-10-CM | POA: Diagnosis not present

## 2014-04-23 DIAGNOSIS — Z87891 Personal history of nicotine dependence: Secondary | ICD-10-CM | POA: Insufficient documentation

## 2014-04-23 DIAGNOSIS — Z91048 Other nonmedicinal substance allergy status: Secondary | ICD-10-CM | POA: Insufficient documentation

## 2014-04-23 DIAGNOSIS — N281 Cyst of kidney, acquired: Secondary | ICD-10-CM | POA: Diagnosis not present

## 2014-04-23 DIAGNOSIS — J449 Chronic obstructive pulmonary disease, unspecified: Secondary | ICD-10-CM | POA: Insufficient documentation

## 2014-04-23 DIAGNOSIS — M1990 Unspecified osteoarthritis, unspecified site: Secondary | ICD-10-CM | POA: Insufficient documentation

## 2014-04-23 DIAGNOSIS — N189 Chronic kidney disease, unspecified: Secondary | ICD-10-CM | POA: Diagnosis not present

## 2014-04-23 DIAGNOSIS — R310 Gross hematuria: Secondary | ICD-10-CM | POA: Insufficient documentation

## 2014-04-23 HISTORY — PX: CYSTOSCOPY/RETROGRADE/URETEROSCOPY: SHX5316

## 2014-04-23 LAB — CBC
HCT: 30.2 % — ABNORMAL LOW (ref 39.0–52.0)
Hemoglobin: 10.2 g/dL — ABNORMAL LOW (ref 13.0–17.0)
MCH: 29.7 pg (ref 26.0–34.0)
MCHC: 33.8 g/dL (ref 30.0–36.0)
MCV: 88 fL (ref 78.0–100.0)
Platelets: 300 10*3/uL (ref 150–400)
RBC: 3.43 MIL/uL — ABNORMAL LOW (ref 4.22–5.81)
RDW: 13 % (ref 11.5–15.5)
WBC: 18 10*3/uL — ABNORMAL HIGH (ref 4.0–10.5)

## 2014-04-23 LAB — BASIC METABOLIC PANEL
Anion gap: 14 (ref 5–15)
BUN: 72 mg/dL — ABNORMAL HIGH (ref 6–23)
CALCIUM: 8.7 mg/dL (ref 8.4–10.5)
CHLORIDE: 91 meq/L — AB (ref 96–112)
CO2: 27 mEq/L (ref 19–32)
CREATININE: 2.57 mg/dL — AB (ref 0.50–1.35)
GFR calc non Af Amer: 23 mL/min — ABNORMAL LOW (ref >90)
GFR, EST AFRICAN AMERICAN: 26 mL/min — AB (ref >90)
Glucose, Bld: 156 mg/dL — ABNORMAL HIGH (ref 70–99)
Potassium: 4.7 mEq/L (ref 3.7–5.3)
Sodium: 132 mEq/L — ABNORMAL LOW (ref 137–147)

## 2014-04-23 SURGERY — CYSTOSCOPY/RETROGRADE/URETEROSCOPY
Anesthesia: General | Laterality: Bilateral

## 2014-04-23 MED ORDER — CIPROFLOXACIN IN D5W 400 MG/200ML IV SOLN
INTRAVENOUS | Status: AC
  Filled 2014-04-23: qty 200

## 2014-04-23 MED ORDER — CIPROFLOXACIN IN D5W 400 MG/200ML IV SOLN
400.0000 mg | INTRAVENOUS | Status: AC
  Administered 2014-04-23: 400 mg via INTRAVENOUS

## 2014-04-23 MED ORDER — FENTANYL CITRATE 0.05 MG/ML IJ SOLN
INTRAMUSCULAR | Status: AC
  Filled 2014-04-23: qty 2

## 2014-04-23 MED ORDER — GLYCOPYRROLATE 0.2 MG/ML IJ SOLN
INTRAMUSCULAR | Status: AC
  Filled 2014-04-23: qty 1

## 2014-04-23 MED ORDER — FENTANYL CITRATE 0.05 MG/ML IJ SOLN
INTRAMUSCULAR | Status: DC | PRN
  Administered 2014-04-23: 50 ug via INTRAVENOUS

## 2014-04-23 MED ORDER — SODIUM CHLORIDE 0.9 % IR SOLN
Status: DC | PRN
  Administered 2014-04-23: 4000 mL via INTRAVESICAL

## 2014-04-23 MED ORDER — GLYCOPYRROLATE 0.2 MG/ML IJ SOLN
INTRAMUSCULAR | Status: DC | PRN
  Administered 2014-04-23: 0.2 mg via INTRAVENOUS

## 2014-04-23 MED ORDER — PROPOFOL 10 MG/ML IV BOLUS
INTRAVENOUS | Status: AC
  Filled 2014-04-23: qty 20

## 2014-04-23 MED ORDER — PHENYLEPHRINE 40 MCG/ML (10ML) SYRINGE FOR IV PUSH (FOR BLOOD PRESSURE SUPPORT)
PREFILLED_SYRINGE | INTRAVENOUS | Status: AC
  Filled 2014-04-23: qty 10

## 2014-04-23 MED ORDER — IOHEXOL 300 MG/ML  SOLN
INTRAMUSCULAR | Status: DC | PRN
  Administered 2014-04-23: 20 mL via ORAL

## 2014-04-23 MED ORDER — FENTANYL CITRATE 0.05 MG/ML IJ SOLN: 25.0000 ug | INTRAMUSCULAR | Status: DC | PRN

## 2014-04-23 MED ORDER — PHENYLEPHRINE HCL 10 MG/ML IJ SOLN
INTRAMUSCULAR | Status: DC | PRN
  Administered 2014-04-23 (×2): 80 ug via INTRAVENOUS

## 2014-04-23 MED ORDER — LACTATED RINGERS IV SOLN
INTRAVENOUS | Status: DC | PRN
  Administered 2014-04-23: 07:00:00 via INTRAVENOUS

## 2014-04-23 MED ORDER — LACTATED RINGERS IV SOLN: INTRAVENOUS | Status: DC

## 2014-04-23 MED ORDER — PROPOFOL 10 MG/ML IV BOLUS
INTRAVENOUS | Status: DC | PRN
  Administered 2014-04-23: 150 mg via INTRAVENOUS

## 2014-04-23 MED ORDER — OXYCODONE-ACETAMINOPHEN 5-325 MG PO TABS: 1.0000 | ORAL_TABLET | ORAL | Status: DC | PRN

## 2014-04-23 SURGICAL SUPPLY — 13 items
BAG URO CATCHER STRL LF (DRAPE) ×2 IMPLANT
BASKET ZERO TIP NITINOL 2.4FR (BASKET) IMPLANT
CATH INTERMIT  6FR 70CM (CATHETERS) ×2 IMPLANT
DRAPE CAMERA CLOSED 9X96 (DRAPES) ×2 IMPLANT
GLOVE BIOGEL M STRL SZ7.5 (GLOVE) ×2 IMPLANT
GOWN STRL REUS W/TWL LRG LVL3 (GOWN DISPOSABLE) ×4 IMPLANT
GUIDEWIRE ANG ZIPWIRE 038X150 (WIRE) IMPLANT
GUIDEWIRE STR DUAL SENSOR (WIRE) ×2 IMPLANT
MANIFOLD NEPTUNE II (INSTRUMENTS) ×2 IMPLANT
PACK CYSTO (CUSTOM PROCEDURE TRAY) ×2 IMPLANT
SHEATH ACCESS URETERAL 38CM (SHEATH) ×2 IMPLANT
STENT URET 6FRX24 CONTOUR (STENTS) ×2 IMPLANT
TUBING CONNECTING 10 (TUBING) ×2 IMPLANT

## 2014-04-23 NOTE — Discharge Instructions (Signed)

## 2014-04-23 NOTE — Anesthesia Postprocedure Evaluation (Signed)
  Anesthesia Post-op Note  Patient: Terry [name]  Procedure(s) Performed: Procedure(s): CYSTOSCOPY BILATERAL RETROGRADE,  LEFT URETEROSCOPY, LEFT STENT PLACEMENT (Bilateral)  Patient Location: PACU  Anesthesia Type:General  Level of Consciousness: awake and alert   Airway and Oxygen Therapy: Patient Spontanous Breathing  Post-op Pain: none  Post-op Assessment: Post-op Vital signs reviewed, Patient's Cardiovascular Status Stable, Respiratory Function Stable, Patent Airway, No signs of Nausea or vomiting and Pain level controlled  Post-op Vital Signs: Reviewed and stable  Last Vitals:  Filed Vitals:   04/23/14 0906  BP: 162/61  Pulse: 67  Temp: 36.3 C  Resp: 16    Complications: No apparent anesthesia complications

## 2014-04-23 NOTE — Transfer of Care (Signed)
Immediate Anesthesia Transfer of Care Note  Patient: Terry [name]  Procedure(s) Performed: Procedure(s): CYSTOSCOPY BILATERAL RETROGRADE,  LEFT URETEROSCOPY, LEFT STENT PLACEMENT (Bilateral)  Patient Location: PACU  Anesthesia Type:General  Level of Consciousness: awake, sedated and patient cooperative  Airway & Oxygen Therapy: Patient Spontanous Breathing and Patient connected to face mask oxygen  Post-op Assessment: Report given to PACU RN and Post -op Vital signs reviewed and stable  Post vital signs: Reviewed and stable  Complications: No apparent anesthesia complications

## 2014-04-23 NOTE — Op Note (Signed)
Preoperative diagnosis:  1. Gross hematuria  Postoperative diagnosis: 1. Gross hematuria  Procedure(s): 1. Cystoscopy 2.  Bilateral retrograde pyelography with interpretation 3.  Ureteroscopy 4.  Saline washing for cytology from left renal pelvis 5.  Left ureteral stent placement (6 x 24 - no string)  Surgeon: Dr. Roxy Horseman, Jr  Anesthesia: General  Complications: None  EBL: Minimal  Specimens: 1.  Left renal pelvic washing for cytology 2.  Urine culture  Disposition of specimens: Pathology and microbiology labs  Intraoperative findings: Right retrograde pyelography demonstrated a normal caliber ureter without hydronephrosis or blunting of the calyces.  No filling defects were noted within the renal collecting system or ureter.  Left retrograde collar if he demonstrated a normal caliber ureter without filling defects.  There was mild dilation of the left renal collecting system with mild blunting of the calyces.  No filling defects were noted.  Indication: [name] is a 79 year old gentleman recently admitted to the hospital with left-sided flank pain and acute kidney injury.  He was found to have what appeared to be a complex left renal cyst that had hemorrhaged and likely caused hematuria and left flank pain.  He does have chronic kidney disease with a baseline creatinine of approximately 2.1.  However, his renal function worsened during his hospitalization was creatinine increasing to over 4.  He is unable to undergo definitive imaging of his complex left renal cyst considering his chronic kidney disease.  He therefore follows up today for further evaluation of his hematuria and his left renal mass.  I recommended that he undergo cystoscopy, bilateral retrograde pyelography, and possible ureteroscopy if indicated.  The potential risks, complications, and expected recovery process were discussed in detail and he gave his informed consent.  Description of procedure:  The  patient presented for the operating room today.  It was noted that his creatinine had continued to improve and was 2.5 prior to his procedure today.  He has been maintained on oral antibiotics with cefuroxime since his discharge.  He was taken to the operating room and a general anesthetic was yesterday he was given preoperative antibiotics, prepped and draped in the usual sterile fashion, and placed in the dorsal lithotomy position.  Next, a preoperative timeout was performed.  Cystourethroscopy was then performed with both a 12 and 70 lens.  This revealed a normal urethra without abnormalities.  Inspection of the bladder was then performed systematically and revealed some areas of inflammation and edema consistent with his indwelling catheter which had been in place prior to his procedure today.  It was thought that he might have a component of urinary retention and might be affecting his renal function during his recent hospitalization.  A urine culture was taken and since he did have an indwelling catheter.  No bladder tumors, stones, or other abnormalities were noted with the bladder.  Attention then turned to the right ureteral orifice.  This was cannulated with a 6 French ureteral catheter.  Omnipaque contrast was injected with no significant findings of concern.  Findings are as dictated above.  An identical procedure was then performed on the contralateral side.  This did reveal some abnormalities of the left renal collecting system including some very mild dilation.  There also appeared to be some irregularity to the left renal collecting system although no distinct filling defects were noted.  For these reasons, it was decided to perform left ureteroscopy.  A 0.38 sensor guidewire was advanced up the left ureter into the left  renal pelvis under fluoroscopic guidance.  A 12/14 ureteral access sheath was then inserted over the wire without resistance into the proximal left ureter.  The digital flexible  ureteroscope was then advanced through the ureteral access sheath and up into the left renal collecting system.  The entire renal collecting system was then examined.  There was excellent visualization and there was evidence of areas that demonstrated some erythema and it edema with an area toward the central area of the renal collecting system and left renal pelvis which appeared to have ruptured.  These findings were consistent with a ruptured hemorrhagic cyst that likely had caused his hematuria.  No tumors were identified.  However, considering the abnormalities, a washing for cytology was obtained within the left renal pelvis and sent for cytologic analysis. Although he did not appear to have obstructing hydronephrosis, a left ureteral stent was left in place in order to avoid postoperative left ureteral edema.  The wire was left within the left renal collecting system and the ureteral access sheath was removed .  The wire was then backloaded on the cystoscope and a 6 x 24 double-J ureteral stent was advanced over the wire with the proximal curl positioned appropriately in the left renal pelvis under fluoroscopic guidance.  The distal curl was noted in the bladder after the wire was removed.  The patient's bladder was then emptied.  Considering the fact that I have very low suspicion that he truly had urinary retention during his recent past position, I didn't think it was reasonable to give him a voiding trial in the cover room.  He was able to be awakened from anesthesia and was transferred to the recovery unit in satisfactory condition.  He will be given a voiding trial prior to discharge.

## 2014-04-23 NOTE — H&P (View-Only) (Signed)
Urology Consult   Physician requesting consult: Dr. Lala Lund  Reason for consult: Left hemorrhagic renal cyst  History of Present Illness: Terry [name] is a 79 y.o. who developed the acute onset of left flank pain upon waking up yesterday morning.  This pain gradually worsened through the day yesterday becoming noticeably more severe as the day progressed causing him to present to the hospital. He denied fever, nausea, vomiting, hematuria, or other symptoms.  He has a history of chronic kidney disease followed by [facility] and has a baseline Cr of 2.1 to 2.3.  On admission, his Cr was elevated up to 2.6. His UA demonstrated 21-50 RBCs without evidence for infection.  He had a CT scan of the abdomen and pelvis without contrast which demonstrated no evidence of urolithiasis but did demonstrate multiple renal cysts including what appeared to be a 3.7 cm left parapelvic renal cyst with hyperdense areas likely consistent with acute hemorrhage. No evidence of hydronephrosis was noted to suggest obstruction.  He has no prior history of hematuria.  He did smoke 1 PPD for 49 years before quitting 14 years ago. He has no family history of GU malignancy. He is on aspirin 81 mg due to a history of cardiac stent placement and stroke.  Past Medical History  Diagnosis Date  . Stroke 2012  . High cholesterol   . Hypertension   . Kidney disease   . COPD (chronic obstructive pulmonary disease)   . CAD (coronary artery disease) [place], [name]  . Acute and chronic respiratory failure 10/22/2012  . Osteoarthritis 10/22/2012  . Migraine     Past Surgical History  Procedure Laterality Date  . Coronary stent placement    . Appendectomy  1984  . Colon surgery  2012    partial colon removed - twisted bowel    Current Hospital Medications:  Home Meds:    Medication List    TAKE these medications       oxyCODONE-acetaminophen 5-325 MG per tablet  Commonly known as:   PERCOCET  Take 1-2 tablets by mouth every 4 (four) hours as needed for severe pain.      ASK your doctor about these medications       albuterol 108 (90 BASE) MCG/ACT inhaler  Commonly known as:  PROVENTIL HFA;VENTOLIN HFA  Inhale 2 puffs into the lungs every 6 (six) hours as needed for wheezing or shortness of breath.     amLODipine 10 MG tablet  Commonly known as:  NORVASC  Take 1 tablet (10 mg total) by mouth every morning.     aspirin EC 81 MG tablet  Take 81 mg by mouth daily.     atorvastatin 10 MG tablet  Commonly known as:  LIPITOR  TAKE ONE TABLET BY MOUTH ONCE DAILY     doxazosin 4 MG tablet  Commonly known as:  CARDURA  TAKE ONE TABLET BY MOUTH ONCE DAILY     fluticasone-salmeterol 115-21 MCG/ACT inhaler  Commonly known as:  ADVAIR HFA  Inhale 2 puffs into the lungs 2 (two) times daily.     furosemide 20 MG tablet  Commonly known as:  LASIX  TAKE ONE TABLET BY MOUTH ONCE DAILY     losartan 100 MG tablet  Commonly known as:  COZAAR  TAKE ONE TABLET BY MOUTH ONCE DAILY     metoprolol 100 MG tablet  Commonly known as:  LOPRESSOR  Take 1 tablet (100 mg total) by mouth daily.  tiotropium 18 MCG inhalation capsule  Commonly known as:  SPIRIVA  Place 1 capsule (18 mcg total) into inhaler and inhale daily.        Scheduled Meds: . amLODipine  10 mg Oral q morning - 10a  . atorvastatin  10 mg Oral q1800  . doxazosin  4 mg Oral Daily  . metoprolol  100 mg Oral Daily  . mometasone-formoterol  2 puff Inhalation BID  . morphine  15 mg Oral Q12H  . tiotropium  18 mcg Inhalation Daily   Continuous Infusions: . sodium chloride 100 mL/hr at 04/13/14 0732   PRN Meds:.albuterol, morphine injection, naLOXone (NARCAN)  injection, ondansetron (ZOFRAN) IV  Allergies:  Allergies  Allergen Reactions  . Other     Yellow Dye-- burns arms    Family History  Problem Relation Age of Onset  . Cancer Mother   . Heart disease Father   . Heart attack Father   .  Cancer Sister   . Parkinson's disease Brother     Social History:  reports that he quit smoking about 14 years ago. His smoking use included Cigarettes, Pipe, and Cigars. He has a 49 pack-year smoking history. He has never used smokeless tobacco. He reports that he does not drink alcohol or use illicit drugs.  ROS: A complete review of systems was performed.  All systems are negative except for pertinent findings as noted.  Physical Exam:  Vital signs in last 24 hours: Temp:  [98.1 F (36.7 C)-98.3 F (36.8 C)] 98.3 F (36.8 C) (10/23 1344) Pulse Rate:  [71-87] 73 (10/23 1344) Resp:  [15-22] 18 (10/23 1344) BP: (120-163)/(59-68) 142/59 mmHg (10/23 1344) SpO2:  [91 %-99 %] 93 % (10/23 1344) Weight:  [88.451 kg (195 lb)-93.1 kg (205 lb 4 oz)] 93.1 kg (205 lb 4 oz) (10/23 0435) Constitutional:  Alert and oriented, No acute distress Cardiovascular: Regular rate and rhythm, No JVD Respiratory: Normal respiratory effort, Lungs clear bilaterally GI: Abdomen is moderately distended with minimal bowel sounds. Minimal abdominal tenderness. GU: Moderate R CVAT. Lymphatic: No lymphadenopathy Neurologic: Grossly intact, no focal deficits Psychiatric: Normal mood and affect  Laboratory Data:   Recent Labs  04/12/14 1859 04/13/14 0421  WBC 12.8* 13.6*  HGB 10.9* 10.2*  HCT 31.3* 29.8*  PLT 233 241     Recent Labs  04/12/14 1859 04/13/14 0421 04/13/14 1344  NA 132* 134*  --   K 5.5* 6.2* 5.4*  CL 97 100  --   GLUCOSE 121* 124*  --   BUN 49* 49*  --   CALCIUM 9.2 8.3*  --   CREATININE 2.63* 2.93*  --      Results for orders placed during the hospital encounter of 04/12/14 (from the past 24 hour(s))  BASIC METABOLIC PANEL     Status: Abnormal   Collection Time    04/13/14  4:21 AM      Result Value Ref Range   Sodium 134 (*) 137 - 147 mEq/L   Potassium 6.2 (*) 3.7 - 5.3 mEq/L   Chloride 100  96 - 112 mEq/L   CO2 22  19 - 32 mEq/L   Glucose, Bld 124 (*) 70 - 99 mg/dL    BUN 49 (*) 6 - 23 mg/dL   Creatinine, Ser 2.93 (*) 0.50 - 1.35 mg/dL   Calcium 8.3 (*) 8.4 - 10.5 mg/dL   GFR calc non Af Amer 19 (*) >90 mL/min   GFR calc Af Amer 22 (*) >90 mL/min  Anion gap 12  5 - 15  CBC     Status: Abnormal   Collection Time    04/13/14  4:21 AM      Result Value Ref Range   WBC 13.6 (*) 4.0 - 10.5 K/uL   RBC 3.39 (*) 4.22 - 5.81 MIL/uL   Hemoglobin 10.2 (*) 13.0 - 17.0 g/dL   HCT 29.8 (*) 39.0 - 52.0 %   MCV 87.9  78.0 - 100.0 fL   MCH 30.1  26.0 - 34.0 pg   MCHC 34.2  30.0 - 36.0 g/dL   RDW 13.2  11.5 - 15.5 %   Platelets 241  150 - 400 K/uL  PROTIME-INR     Status: None   Collection Time    04/13/14  4:21 AM      Result Value Ref Range   Prothrombin Time 13.8  11.6 - 15.2 seconds   INR 1.05  0.00 - 1.49  APTT     Status: None   Collection Time    04/13/14  4:21 AM      Result Value Ref Range   aPTT 30  24 - 37 seconds  PRO B NATRIURETIC PEPTIDE     Status: None   Collection Time    04/13/14  4:21 AM      Result Value Ref Range   Pro B Natriuretic peptide (BNP) 420.6  0 - 450 pg/mL  OSMOLALITY     Status: None   Collection Time    04/13/14 11:50 AM      Result Value Ref Range   Osmolality 299  275 - 300 mOsm/kg  POTASSIUM     Status: Abnormal   Collection Time    04/13/14  1:44 PM      Result Value Ref Range   Potassium 5.4 (*) 3.7 - 5.3 mEq/L  CREATININE, URINE, RANDOM     Status: None   Collection Time    04/13/14  5:53 PM      Result Value Ref Range   Creatinine, Urine 180.16    SODIUM, URINE, RANDOM     Status: None   Collection Time    04/13/14  5:53 PM      Result Value Ref Range   Sodium, Ur 21     No results found for this or any previous visit (from the past 240 hour(s)).  Renal Function:  Recent Labs  04/12/14 1859 04/13/14 0421  CREATININE 2.63* 2.93*   Estimated Creatinine Clearance: 23.8 ml/min (by C-G formula based on Cr of 2.93).  Radiologic Imaging: Ct Abdomen Pelvis Wo Contrast  04/12/2014   CLINICAL  DATA:  Left flank pain all day, no history of kidney stones, stage III renal failure  EXAM: CT ABDOMEN AND PELVIS WITHOUT CONTRAST  TECHNIQUE: Multidetector CT imaging of the abdomen and pelvis was performed following the standard protocol without IV contrast.  COMPARISON:  None.  FINDINGS: Lung bases are unremarkable. The study is limited without IV contrast. Unenhanced liver shows no biliary ductal dilatation.  No calcified gallstones are noted within gallbladder. Unenhanced pancreas spleen and adrenal glands are unremarkable. Extensive atherosclerotic calcifications of abdominal aorta and iliac arteries. No aortic aneurysm.  Unenhanced kidneys shows a lobulated contour. Multiple exophytic lesions are noted bilateral kidneys. This may represent renal cyst but cannot be characterized without IV contrast. A probable cyst in lower pole of the right kidney measures 1.2 cm. Probable cyst in upper pole of the right kidney measures 1.4 cm. There are few high  density exophytic lesions. This may represent hemorrhagic or proteinaceous cyst. The largest in upper pole of the right kidney measures 8 mm. The largest high density lesion in upper pole of the left kidney measures 8 mm.  There is a central parapelvic cyst in midpole of the left kidney with peripheral high-density material. This measures 3.4 x 2.8 cm. Some of the high density material is extending in central calyces of the lower pole of the left kidney as seen in axial image 61. Findings may represent hemorrhagic products within cyst or cystic neoplasm. Further correlation with enhanced study and/or urology exam is recommended.  No nephrolithiasis. No calcified ureteral calculi. Oral contrast material was given to the patient. No small bowel or colonic obstruction. Stool noted in right colon and transverse colon. There is no pericecal inflammation. Sigmoid colon diverticula are noted. No evidence of acute diverticulitis. Prostate gland and seminal vesicles are  unremarkable. No calcified calculi are noted within urinary bladder. There is a small left hydrocele.  IMPRESSION: 1. Limited study without IV contrast. There is a central parapelvic cyst in midpole of the left kidney with peripheral high-density material. This measures 3.4 x 2.8 cm. Some of the high density material is extending in central calyces of the lower pole of the left kidney as seen in axial image 61. Findings may represent hemorrhagic products within cyst or cystic neoplasm. Further correlation with enhanced study and/or urology exam is recommended. 2. No nephrolithiasis. No calcified ureteral calculi. No hydronephrosis or hydroureter. 3. Multiple exophytic lesions bilateral kidney cannot be characterized without IV contrast. Although this may represent cysts some of the lesions are high density. Hemorrhagic or proteinaceous cysts cannot be excluded. 4. No calcified calculi are noted within urinary bladder. 5. Sigmoid colon diverticula are noted. No evidence of acute diverticulitis. 6. Small left hydrocele.   Electronically Signed   By: Lahoma Crocker M.D.   On: 04/12/2014 22:25   US Renal  04/13/2014   CLINICAL DATA:  Acute renal failure.  EXAM: RENAL/URINARY TRACT ULTRASOUND COMPLETE  COMPARISON:  CT scan of April 12, 2014.  FINDINGS: Right Kidney:  Length: 11.5 cm. Three rounded hypoechoic abnormalities are noted most consistent with cyst. The largest measures 1.8 cm. Echogenicity within normal limits. No mass or hydronephrosis visualized.  Left Kidney:  Length: 12.9 cm. Echogenicity within normal limits. Hypoechoic parapelvic abnormality is noted which measures 3.7 x 2.7 cm. It is not clearly a cyst by ultrasound criteria, and therefore further evaluation with MRI or CT with contrast is recommended to rule out mass. No hydronephrosis visualized.  Bladder:  Limited visualization as patient was not cooperative with the exam.  IMPRESSION: Right renal cysts are noted.  3.7 cm parapelvic rounded  abnormality is noted which is not clearly a cyst by ultrasound criteria. Further evaluation with MRI or CT scan with intravenous contrast is recommended to evaluate for possible neoplasm or malignancy.   Electronically Signed   By: Sabino Dick M.D.   On: 04/13/2014 15:34   Dg Chest Portable 1 View  04/12/2014   CLINICAL DATA:  Left flank pain all day. Kidney problems. Abdominal pain.  EXAM: PORTABLE CHEST - 1 VIEW  COMPARISON:  11/29/2012  FINDINGS: The heart size and mediastinal contours are within normal limits. Both lungs are clear. The visualized skeletal structures are unremarkable.  IMPRESSION: No active disease.   Electronically Signed   By: Lucienne Capers M.D.   On: 04/12/2014 21:08    I independently reviewed the above imaging studies.  Impression/Recommendation 1) Left renal complex renal cyst/hemturia: This is likely a benign hemorrhagic cyst that acutely bled. However, an underlying malignancy cannot be absolutely ruled out.  Diagnostic evaluation is complicated as he cannot receive IV contrast with CT or MR due to his renal dysfunction.  I have recommended he proceed with further endoscopic evaluation including cystoscopy and retrograde pyelography with possible left ureteroscopy to completely evaluate his hematuria and considering the central location of the lesion noted on his CT which would have a small risk of representing a urothelial carcinoma. This will be arranged as an outpatient in the next 1-2 weeks. I discussed the potential benefits and risks of the procedure, side effects of the proposed treatment, the likelihood of the patient achieving the goals of the procedure, and any potential problems that might occur during the procedure or recuperation.   If endoscopic evaluation is unremarkable, he will need follow up imaging with ultrasound or non-contrast MR to ensure no growth of his cystic mass over time.  Chuck Caban,LES 04/13/2014, 8:28 PM    Pryor Curia MD   CC:  Dr. Lala Lund

## 2014-04-23 NOTE — Interval H&P Note (Signed)
History and Physical Interval Note:  04/23/2014 7:23 AM  Terry [name]  has presented today for surgery, with the diagnosis of HEMATURIA  The various methods of treatment have been discussed with the patient and family. After consideration of risks, benefits and other options for treatment, the patient has consented to  Procedure(s): CYSTOSCOPY BILATERAL RETROGRADE, POSSIBLE URETEROSCOPY, POSSIBLE CYSTOSCOPY WITH BIOPSY (Bilateral) as a surgical intervention .  The patient's history has been reviewed, patient examined, no change in status, stable for surgery.  I have reviewed the patient's chart and labs.  Questions were answered to the patient's satisfaction.     Terry [name],LES

## 2014-04-23 NOTE — Anesthesia Preprocedure Evaluation (Addendum)
Anesthesia Evaluation  Patient identified by MRN, date of birth, ID band Patient awake    Reviewed: Allergy & Precautions, H&P , NPO status , Patient's Chart, lab work & pertinent test results  History of Anesthesia Complications Negative for: history of anesthetic complications  Airway Mallampati: III  TM Distance: >3 FB Neck ROM: Full    Dental  (+) Edentulous Upper, Missing,    Pulmonary neg shortness of breath, neg sleep apnea, COPD COPD inhaler, former smoker,  breath sounds clear to auscultation        Cardiovascular hypertension, Pt. on medications and Pt. on home beta blockers - angina+ CAD and +CHF - Past MI Rhythm:Regular     Neuro/Psych  Headaches, TIAnegative psych ROS   GI/Hepatic negative GI ROS, Neg liver ROS,   Endo/Other  Morbid obesity  Renal/GU Renal InsufficiencyRenal disease     Musculoskeletal  (+) Arthritis -, Osteoarthritis,    Abdominal   Peds  Hematology  (+) anemia ,   Anesthesia Other Findings   Reproductive/Obstetrics                            Anesthesia Physical Anesthesia Plan  ASA: III  Anesthesia Plan: General   Post-op Pain Management:    Induction: Intravenous  Airway Management Planned: LMA  Additional Equipment: None  Intra-op Plan:   Post-operative Plan: Extubation in OR  Informed Consent: I have reviewed the patients History and Physical, chart, labs and discussed the procedure including the risks, benefits and alternatives for the proposed anesthesia with the patient or authorized representative who has indicated his/her understanding and acceptance.   Dental advisory given  Plan Discussed with: CRNA and Surgeon  Anesthesia Plan Comments:         Anesthesia Quick Evaluation

## 2014-04-24 ENCOUNTER — Encounter (HOSPITAL_COMMUNITY): Payer: Self-pay | Admitting: Urology

## 2014-04-24 ENCOUNTER — Other Ambulatory Visit: Payer: Self-pay | Admitting: Family Medicine

## 2014-04-24 LAB — URINE CULTURE
Colony Count: NO GROWTH
Culture: NO GROWTH

## 2014-04-26 ENCOUNTER — Ambulatory Visit (INDEPENDENT_AMBULATORY_CARE_PROVIDER_SITE_OTHER): Payer: Medicare PPO | Admitting: Family Medicine

## 2014-04-26 ENCOUNTER — Encounter: Payer: Self-pay | Admitting: Family Medicine

## 2014-04-26 VITALS — BP 118/64 | HR 72 | Temp 98.1°F | Ht 67.5 in | Wt 201.7 lb

## 2014-04-26 DIAGNOSIS — N281 Cyst of kidney, acquired: Secondary | ICD-10-CM

## 2014-04-26 DIAGNOSIS — N183 Chronic kidney disease, stage 3 unspecified: Secondary | ICD-10-CM

## 2014-04-26 DIAGNOSIS — I25810 Atherosclerosis of coronary artery bypass graft(s) without angina pectoris: Secondary | ICD-10-CM

## 2014-04-26 DIAGNOSIS — D649 Anemia, unspecified: Secondary | ICD-10-CM

## 2014-04-26 DIAGNOSIS — Q6100 Congenital renal cyst, unspecified: Secondary | ICD-10-CM

## 2014-04-26 DIAGNOSIS — I10 Essential (primary) hypertension: Secondary | ICD-10-CM

## 2014-04-26 DIAGNOSIS — N179 Acute kidney failure, unspecified: Secondary | ICD-10-CM

## 2014-04-26 DIAGNOSIS — D72829 Elevated white blood cell count, unspecified: Secondary | ICD-10-CM

## 2014-04-26 LAB — CBC WITH DIFFERENTIAL/PLATELET
BASOS PCT: 0 % (ref 0.0–3.0)
Basophils Absolute: 0 10*3/uL (ref 0.0–0.1)
Eosinophils Absolute: 0.1 10*3/uL (ref 0.0–0.7)
Eosinophils Relative: 0.6 % (ref 0.0–5.0)
HCT: 29.6 % — ABNORMAL LOW (ref 39.0–52.0)
Hemoglobin: 9.7 g/dL — ABNORMAL LOW (ref 13.0–17.0)
Lymphs Abs: 0.9 10*3/uL (ref 0.7–4.0)
MCHC: 32.8 g/dL (ref 30.0–36.0)
MCV: 91.1 fl (ref 78.0–100.0)
MONOS PCT: 6.6 % (ref 3.0–12.0)
Monocytes Absolute: 1 10*3/uL (ref 0.1–1.0)
Neutro Abs: 13.2 10*3/uL — ABNORMAL HIGH (ref 1.4–7.7)
Platelets: 262 10*3/uL (ref 150.0–400.0)
RBC: 3.25 Mil/uL — AB (ref 4.22–5.81)
RDW: 13.5 % (ref 11.5–15.5)
WBC: 15.2 10*3/uL — AB (ref 4.0–10.5)

## 2014-04-26 LAB — BASIC METABOLIC PANEL
BUN: 57 mg/dL — AB (ref 6–23)
CHLORIDE: 98 meq/L (ref 96–112)
CO2: 29 meq/L (ref 19–32)
Calcium: 8.3 mg/dL — ABNORMAL LOW (ref 8.4–10.5)
Creatinine, Ser: 2.5 mg/dL — ABNORMAL HIGH (ref 0.4–1.5)
GFR: 26.39 mL/min — ABNORMAL LOW (ref >60.00)
GLUCOSE: 89 mg/dL (ref 70–99)
POTASSIUM: 4.2 meq/L (ref 3.5–5.1)
Sodium: 134 mEq/L — ABNORMAL LOW (ref 135–145)

## 2014-04-26 MED ORDER — NYSTATIN 100000 UNIT/ML MT SUSP: 5.0000 mL | Freq: Four times a day (QID) | OROMUCOSAL | Status: DC

## 2014-04-26 NOTE — Progress Notes (Signed)
HPI:  Hospital Follow up: PMH sig for CAD, COPD, stroke, HTN, HLD, CKD Hospitalized 10/22-10/30/2015 for left flank pain thought to be related to a ruptured renal cyst or cystic neoplasm  -with perinephric stranding and leukcytosis treated with IV Rocephin, then PO Ceftin which he finished yesterday -seeing urologist for this,unable to undergo contrast eval with imaging per notes, so  s/p cystoscopy, pyelography, ureteroscopy, L ureteral sent placement 11/2 with Dr. Alinda Money, report sees Dr. Alinda Money the beginning of next week -AoCRF and BOO - foley placed, IVF and acei and lasix held in hospital - he is to follow up with nephrologist (05/09/14) and urologist outpt (in a few days) -recent labs 3 days ago with urology showed Cr 2.57 and recurrent leukocytosis since discharge, urine culture from 11/2 negative Reports he feels well, of O2, report SOB resolved Denies: fevers, swelling, pain uncontrolled, chills, cough, congestion, SOB Doing PT at home Does report thrush in mouth -   CT 04/14/14 IMPRESSION: 1. Since the prior CT, the presumed left renal sinus cyst has hemorrhaged. It is now hyper attenuating consistent with acute blood. The cause of the hemorrhage is unclear. There is associated perinephric stranding extending along the left ureter. The left ureter is normal in course and in caliber. There is no evidence of obstructive uropathy. 2. No other convincing acute findings. 3. Other renal masses seen bilaterally appear to be a combination of simple cysts and cyst complicated by previous hemorrhage. These lesions are not fully characterized on the unenhanced CT imaging performed currently and previously or on the ultrasound. More comprehensive characterization of these lesions would be best performed with renal MRI with and without contrast if the patient can tolerate that procedure. 4. Multiple chronic findings as described.  ROS: See pertinent positives and negatives per  HPI.  Past Medical History  Diagnosis Date  . Stroke 2012  . High cholesterol   . Hypertension   . Kidney disease   . COPD (chronic obstructive pulmonary disease)   . CAD (coronary artery disease) [place], [name]  . Acute and chronic respiratory failure 10/22/2012  . Osteoarthritis 10/22/2012  . Migraine     Past Surgical History  Procedure Laterality Date  . Coronary stent placement    . Appendectomy  1984  . Colon surgery  2012    partial colon removed - twisted bowel  . Hernia repair      Right   . Cystoscopy/retrograde/ureteroscopy Bilateral 04/23/2014    Procedure: CYSTOSCOPY BILATERAL RETROGRADE,  LEFT URETEROSCOPY, LEFT STENT PLACEMENT;  Surgeon: Raynelle Bring, MD;  Location: [facility];  Service: Urology;  Laterality: Bilateral;    Family History  Problem Relation Age of Onset  . Cancer Mother   . Heart disease Father   . Heart attack Father   . Cancer Sister   . Parkinson's disease Brother     History   Social History  . Marital Status: Married    Spouse Name: N/A    Number of Children: 3  . Years of Education: N/A   Occupational History  . retired-worked in radio    Social History Main Topics  . Smoking status: Former Smoker -- 1.00 packs/day for 49 years    Types: Cigarettes, Pipe, Cigars    Quit date: 02/21/2000  . Smokeless tobacco: Never Used  . Alcohol Use: No  . Drug Use: No  . Sexual Activity: None   Other Topics Concern  . None   Social History Narrative   Work or  School: retired [employer] - [employer]      Home Situation: lives with wife in [place] Beliefs: [place] regular; poor diet             Current outpatient prescriptions: albuterol (PROVENTIL HFA;VENTOLIN HFA) 108 (90 BASE) MCG/ACT inhaler, Inhale 2 puffs into the lungs every 6 (six) hours as needed for wheezing or shortness of breath., Disp: 1 Inhaler, Rfl: 0;  albuterol (PROVENTIL) (2.5 MG/3ML) 0.083% nebulizer solution, Take 3 mLs by  nebulization every 4 (four) hours as needed for wheezing or shortness of breath., Disp: 75 mL, Rfl: 12 amLODipine (NORVASC) 10 MG tablet, Take 1 tablet (10 mg total) by mouth every morning., Disp: 30 tablet, Rfl: 11;  aspirin EC 81 MG tablet, Take 81 mg by mouth daily., Disp: , Rfl: ;  atorvastatin (LIPITOR) 10 MG tablet, TAKE ONE TABLET BY MOUTH ONCE DAILY, Disp: 30 tablet, Rfl: 5;  docusate sodium 100 MG CAPS, Take 200 mg by mouth 2 (two) times daily as needed for mild constipation., Disp: 20 capsule, Rfl: 0 doxazosin (CARDURA) 4 MG tablet, TAKE ONE TABLET BY MOUTH ONCE DAILY, Disp: 30 tablet, Rfl: 3;  fluticasone-salmeterol (ADVAIR HFA) 115-21 MCG/ACT inhaler, Inhale 2 puffs into the lungs 2 (two) times daily., Disp: 1 Inhaler, Rfl: 11;  metoprolol (LOPRESSOR) 100 MG tablet, Take 1 tablet (100 mg total) by mouth daily., Disp: 90 tablet, Rfl: 3 predniSONE (DELTASONE) 5 MG tablet, Label  & dispense according to the schedule below. 10 Pills PO for 3 days then, 8 Pills PO for 3 days, 6 Pills PO for 3 days, 4 Pills PO for 3 days, 2 Pills PO for 3 days, 1 Pills PO for 3 days, 1/2 Pill  PO for 3 days then STOP. Total 95 pills., Disp: 95 tablet, Rfl: 0;  sodium bicarbonate 650 MG tablet, Take 650 mg by mouth 2 (two) times daily., Disp: , Rfl:  tiotropium (SPIRIVA) 18 MCG inhalation capsule, Place 1 capsule (18 mcg total) into inhaler and inhale daily., Disp: 30 capsule, Rfl: 11;  nystatin (MYCOSTATIN) 100000 UNIT/ML suspension, Take 5 mLs (500,000 Units total) by mouth 4 (four) times daily., Disp: 60 mL, Rfl: 0  EXAM:  Filed Vitals:   04/26/14 1328  BP: 118/64  Pulse: 72  Temp: 98.1 F (36.7 C)    Body mass index is 31.11 kg/(m^2).  GENERAL: vitals reviewed and listed above, alert, oriented, appears well hydrated and in no acute distress  HEENT: atraumatic, conjunttiva clear, no obvious abnormalities on inspection of external nose and ears, oral thrush  NECK: no obvious masses on  inspection  LUNGS: clear to auscultation bilaterally, no wheezes, rales or rhonchi, good air movement  CV: HRRR, no peripheral edema  MS: moves all extremities without noticeable abnormality  PSYCH: pleasant and cooperative, no obvious depression or anxiety  ASSESSMENT AND PLAN:  Discussed the following assessment and plan:  Leukocytosis - Plan: CBC with Differential  Renal cyst  Acute kidney injury  Chronic kidney disease, stage III (moderate) - Plan: Basic metabolic panel  Anemia, unspecified anemia type - Plan: CBC with Differential  Coronary artery disease involving coronary bypass graft of native heart without angina pectoris  Essential hypertension  -reports he is feeling well with good oral intake food and drink -pain well controlled -denies fevers, chills, cough, congestion, SOB -advised repeat labs and cxr - he declined cxr today -nystatin for oral thrush -follow up with urology and nephrology as scheduled -  defer to nephrologist for restarting acei./lasic - BP ok today -Patient advised to return or notify a doctor immediately if symptoms worsen or persist or new concerns arise.  There are no Patient Instructions on file for this visit.   Colin Benton R.

## 2014-04-26 NOTE — Progress Notes (Signed)
Pre visit review using our clinic review tool, if applicable. No additional management support is needed unless otherwise documented below in the visit note. 

## 2014-05-02 ENCOUNTER — Telehealth: Payer: Self-pay | Admitting: Family Medicine

## 2014-05-02 ENCOUNTER — Other Ambulatory Visit: Payer: Self-pay | Admitting: Family Medicine

## 2014-05-02 NOTE — Telephone Encounter (Signed)
Pt request refill of the following: nystatin (MYCOSTATIN) 100000 UNIT/ML suspension   Phamacy:  [facility] [place]

## 2014-05-03 ENCOUNTER — Encounter: Payer: Self-pay | Admitting: Family Medicine

## 2014-05-03 MED ORDER — NYSTATIN 100000 UNIT/ML MT SUSP: 5.0000 mL | Freq: Four times a day (QID) | OROMUCOSAL | Status: DC

## 2014-05-03 NOTE — Telephone Encounter (Signed)
Ok to refill once. If persistent symptoms after that advise him to let us know.

## 2014-05-03 NOTE — Telephone Encounter (Signed)
Rx done and the pt was informed. 

## 2014-05-03 NOTE — Addendum Note (Signed)
Addended by: Agnes Lawrence on: 05/03/2014 03:08 PM   Modules accepted: Orders

## 2014-05-03 NOTE — Telephone Encounter (Signed)
Rx done. 

## 2014-05-08 ENCOUNTER — Ambulatory Visit (AMBULATORY_SURGERY_CENTER): Payer: Self-pay

## 2014-05-08 VITALS — Ht 68.0 in | Wt 199.8 lb

## 2014-05-08 DIAGNOSIS — Z86010 Personal history of colonic polyps: Secondary | ICD-10-CM

## 2014-05-08 MED ORDER — MOVIPREP 100 G PO SOLR: ORAL | Status: DC

## 2014-05-08 NOTE — Progress Notes (Signed)
Pt came into the office today for his pre-visit prior to his colonosvopy with Dr Ardis Hughs on 05/29/14.Pt states he has an appointment with Dr Joelyn Oms (urologist) in the am due to a kidney infection. The pt was advised to let us know if there are any changes in his medical condition and we will need medical clearance prior to his colonoscopy from Dr Joelyn Oms. The pt understood. He will call our office to cancel the colonoscopy if necessary.   Per pt, no allergies to soy or egg products.Pt not taking any weight loss meds or using  O2 at home.

## 2014-05-10 ENCOUNTER — Encounter: Payer: Self-pay | Admitting: Family Medicine

## 2014-05-11 ENCOUNTER — Telehealth: Payer: Self-pay | Admitting: Gastroenterology

## 2014-05-11 NOTE — Telephone Encounter (Signed)
Spoke with patient. He states his kidney dr. Quintella Baton that he can have the procedure, but he can not take the prep phos pha soda. Explained to patient that the Prep he was given was the [place] and that we use this for kidney patient. He confirmed that he would be here for colonoscopy on 05-29-14. No further questions from pt.

## 2014-05-15 ENCOUNTER — Telehealth: Payer: Self-pay | Admitting: Family Medicine

## 2014-05-15 ENCOUNTER — Telehealth: Payer: Self-pay | Admitting: Internal Medicine

## 2014-05-15 MED ORDER — ALBUTEROL SULFATE HFA 108 (90 BASE) MCG/ACT IN AERS: 2.0000 | INHALATION_SPRAY | Freq: Four times a day (QID) | RESPIRATORY_TRACT | Status: DC | PRN

## 2014-05-15 NOTE — Telephone Encounter (Signed)
Ventolin refills sent to pharmacy Called spoke with patient's spouse and informed her that rx has been sent to pharmacy Pt has not been seen in the office since 2.24.15, but was not recommended to follow up for another year HFU has been scheduled with MW for 12.7.15 at 3:15pm (later date requested by spouse) Advised spouse to contact the office if pt needs sooner follow up  Nothing further needed; will sign off.

## 2014-05-15 NOTE — Telephone Encounter (Signed)
Wife called b/c pt  Needs a refill of albuterol (PROVENTIL HFA;VENTOLIN HFA) 108 (90 BASE) MCG/ACT inhaler Pt has cpod and the inhaler helps.  Pt had high blood pressure and his kidney dr is taking him off of metoprolol (LOPRESSOR) 100 MG table.  So this is not helping him breathe. Pt  Would like a rx for the inhaler sent to [facility] [place]. Wife aware dr Maudie Mercury is out and request if another dr could call this in. Wife concerned pt is getting worse. Pt cannot get any other bp med until after labs tomorrow. pls advise.

## 2014-05-15 NOTE — Telephone Encounter (Signed)
I called Mrs Sandberg and advised she contact Dr Melvyn Novas for a refill on this as he is treating the pt for COPD and she was given Dr Gustavus Bryant number.

## 2014-05-16 ENCOUNTER — Telehealth: Payer: Self-pay | Admitting: Internal Medicine

## 2014-05-16 DIAGNOSIS — J441 Chronic obstructive pulmonary disease with (acute) exacerbation: Secondary | ICD-10-CM | POA: Insufficient documentation

## 2014-05-16 MED ORDER — LEVOFLOXACIN 500 MG PO TABS: 500.0000 mg | ORAL_TABLET | Freq: Every day | ORAL | Status: DC

## 2014-05-16 MED ORDER — PREDNISONE 10 MG PO TABS: ORAL_TABLET | ORAL | Status: AC

## 2014-05-16 NOTE — Telephone Encounter (Signed)
Patien of Dr Melvyn Novas.  Calls in today : increased dyspnea, chest tightness, cough and sputum volume today. No chest pain,f ever, chills    Pas hx Patient Active Problem List   Diagnosis Date Noted  . COPD exacerbation 05/16/2014  . Left flank pain 04/13/2014  . Hyperkalemia 04/13/2014  . COPD GOLD III 10/22/2012  . Chronic kidney disease, stage III (moderate) 10/22/2012  . CAD (coronary artery disease) of artery bypass graft 10/22/2012  . Other and unspecified hyperlipidemia 10/22/2012  . Osteoarthritis 10/22/2012  . HTN (hypertension) 10/22/2012   Dx  AECOPD  PLAN levaquin 500mg  once daily x 5 days Please take prednisone 40 mg x1 day, then 30 mg x1 day, then 20 mg x1 day, then 10 mg x1 day, and then 5 mg x1 day and stop   Dr. Brand Males, M.D., [facility].C.P Pulmonary and Critical Care Medicine Staff Physician [facility] Pager: [phone], If no answer or between  15:00h - 7:00h: call [phone]  05/16/2014 3:56 PM

## 2014-05-18 ENCOUNTER — Telehealth: Payer: Self-pay | Admitting: Cardiovascular Disease

## 2014-05-18 NOTE — Telephone Encounter (Signed)
New Msg   Patient wife calling today, states patient's BP increasing at night and early morning. Patient has been in hospital for ruptured cyst near kidney and was removed from a medication so that his kidneys won't be affected. Wife is looking for alternate med to prevent increase in BP.  Wife didn't have any specific BP readings to provide. Please contact at [phone]

## 2014-05-18 NOTE — Telephone Encounter (Signed)
Returned call to patient's wife she stated husband was in hospital recently.Stated furosemide and losartan stopped due to elevated kidney functions.Stated husband's B/P has been elevated in afternoons.Stated they have not checked B/P but husband feels like B/P elevated he gets flushed and heart pounds.Stated he had lab work this past Wed at [facility].Stated she would like appointment with Dr.McAlhany.Appointment scheduled with Dr.McAlhany 05/25/14 at 4:15 pm.Wife will call and have lab work sent to [facility].

## 2014-05-25 ENCOUNTER — Ambulatory Visit (INDEPENDENT_AMBULATORY_CARE_PROVIDER_SITE_OTHER): Payer: Medicare PPO | Admitting: Cardiovascular Disease

## 2014-05-25 ENCOUNTER — Encounter: Payer: Self-pay | Admitting: Cardiovascular Disease

## 2014-05-25 VITALS — BP 142/60 | HR 85 | Ht 68.0 in | Wt 192.0 lb

## 2014-05-25 DIAGNOSIS — I480 Paroxysmal atrial fibrillation: Secondary | ICD-10-CM

## 2014-05-25 DIAGNOSIS — R002 Palpitations: Secondary | ICD-10-CM

## 2014-05-25 DIAGNOSIS — E785 Hyperlipidemia, unspecified: Secondary | ICD-10-CM

## 2014-05-25 DIAGNOSIS — I10 Essential (primary) hypertension: Secondary | ICD-10-CM

## 2014-05-25 DIAGNOSIS — I2510 Atherosclerotic heart disease of native coronary artery without angina pectoris: Secondary | ICD-10-CM

## 2014-05-25 LAB — BASIC METABOLIC PANEL
BUN: 45 mg/dL — AB (ref 6–23)
CALCIUM: 8.7 mg/dL (ref 8.4–10.5)
CO2: 24 meq/L (ref 19–32)
CREATININE: 2.62 mg/dL — AB (ref 0.50–1.35)
Chloride: 98 mEq/L (ref 96–112)
GLUCOSE: 101 mg/dL — AB (ref 70–99)
Potassium: 4.1 mEq/L (ref 3.5–5.3)
Sodium: 135 mEq/L (ref 135–145)

## 2014-05-25 MED ORDER — APIXABAN 5 MG PO TABS: 5.0000 mg | ORAL_TABLET | Freq: Two times a day (BID) | ORAL | Status: DC

## 2014-05-25 MED ORDER — METOPROLOL TARTRATE 100 MG PO TABS: 100.0000 mg | ORAL_TABLET | Freq: Two times a day (BID) | ORAL | Status: DC

## 2014-05-25 NOTE — Progress Notes (Signed)
History of Present Illness: 79 yo male with history of HTN, HLD, CVA, COPD, CAD here today for cardiac follow up. He had been followed by cardiology in [place], [name] for many years until he moved to [place] in 2014. Cath records received from [facility] . Cardiac cath 11/30/1997. Mild distal Left main stenosis, 25% proximal LAD, minor irregularities Circumflex, 95% mid RCA stenosis treated with 3.5 x 32 mm bare metal stent. There was a ? Overlapping 3.5 x 18 mm bare metal stent more proximally. Echo 02/03/13 with LVEF=50-55%. He had normal carotid dopplers in [place] 2013. He was admitted to [facility] October 2015 with ruptured kidney cyst.   He is here today for follow up. He describes heart palpitations and heart racing when he walks. Some dyspnea. No chest pain. Some LE edema but improved on torsemide. Most recent BMET one month ago with creatinine 2.5.   Primary Care Physician: Dr. Colin Benton  Lipid Panel     Component Value Date/Time   CHOL 172 02/03/2013 0933   TRIG 274.0* 02/03/2013 0933   HDL 32.70* 02/03/2013 0933   CHOLHDL 5 02/03/2013 0933   VLDL 54.8* 02/03/2013 0933       LDL: 93  Past Medical History  Diagnosis Date  . Stroke 2012  . High cholesterol   . Hypertension   . Kidney disease   . COPD (chronic obstructive pulmonary disease)   . CAD (coronary artery disease) [place], [name]  . Acute and chronic respiratory failure 10/22/2012  . Osteoarthritis 10/22/2012    Past Surgical History  Procedure Laterality Date  . Coronary stent placement  1999    2 stents  . Appendectomy  1984  . Colon surgery  2012    partial colon removed - twisted bowel  . Hernia repair      Right inguinal  . Cystoscopy/retrograde/ureteroscopy Bilateral 04/23/2014    Procedure: CYSTOSCOPY BILATERAL RETROGRADE,  LEFT URETEROSCOPY, LEFT STENT PLACEMENT;  Surgeon: Raynelle Bring, MD;  Location: [facility] ORS;  Service: Urology;  Laterality: Bilateral;    Current Outpatient  Prescriptions  Medication Sig Dispense Refill  . albuterol (PROVENTIL) (2.5 MG/3ML) 0.083% nebulizer solution Take 3 mLs by nebulization every 4 (four) hours as needed for wheezing or shortness of breath. 75 mL 12  . amLODipine (NORVASC) 10 MG tablet Take 1 tablet (10 mg total) by mouth every morning. 30 tablet 11  . aspirin EC 81 MG tablet Take 81 mg by mouth as needed.     [name] atorvastatin (LIPITOR) 10 MG tablet TAKE ONE TABLET BY MOUTH ONCE DAILY 30 tablet 5  . doxazosin (CARDURA) 4 MG tablet TAKE ONE TABLET BY MOUTH ONCE DAILY 30 tablet 3  . fluticasone-salmeterol (ADVAIR HFA) 115-21 MCG/ACT inhaler Inhale 2 puffs into the lungs 2 (two) times daily. 1 Inhaler 11  . metoprolol (LOPRESSOR) 100 MG tablet Take 1 tablet (100 mg total) by mouth daily. 90 tablet 3  . MOVIPREP 100 G SOLR Moviprep as directed / no substitutions 1 kit 0  . nystatin (MYCOSTATIN) 100000 UNIT/ML suspension Take 5 mLs (500,000 Units total) by mouth 4 (four) times daily. 60 mL 0  . predniSONE (DELTASONE) 5 MG tablet Label  & dispense according to the schedule below. 10 Pills PO for 3 days then, 8 Pills PO for 3 days, 6 Pills PO for 3 days, 4 Pills PO for 3 days, 2 Pills PO for 3 days, 1 Pills PO for 3 days, 1/2 Pill  PO for  3 days then STOP. Total 95 pills. 95 tablet 0  . tiotropium (SPIRIVA) 18 MCG inhalation capsule Place 1 capsule (18 mcg total) into inhaler and inhale daily. 30 capsule 11  . torsemide (DEMADEX) 10 MG tablet Take 10 mg by mouth daily.      No current facility-administered medications for this visit.    Allergies  Allergen Reactions  . Yellow Dyes (Non-Tartrazine) Other (See Comments)    Burns arms    History   Social History  . Marital Status: Married    Spouse Name: N/A    Number of Children: 3  . Years of Education: N/A   Occupational History  . retired-worked in radio    Social History Main Topics  . Smoking status: Former Smoker -- 1.00 packs/day for 49 years    Types: Cigarettes,  Pipe, Cigars    Quit date: 02/21/2000  . Smokeless tobacco: Never Used  . Alcohol Use: No  . Drug Use: No  . Sexual Activity: Not on file   Other Topics Concern  . Not on file   Social History Narrative   Work or School: retired [employer] Situation: lives with wife in [place] Beliefs: [place] regular; poor diet             Family History  Problem Relation Age of Onset  . Cancer Mother   . Heart disease Father   . Heart attack Father   . Cancer Sister   . Parkinson's disease Brother     Review of Systems:  As stated in the HPI and otherwise negative.   BP 142/60 mmHg  Pulse 85  Ht 5' 8"  (1.727 m)  Wt 192 lb (87.091 kg)  BMI 29.20 kg/m2  SpO2 97%  Physical Examination: General: Well developed, well nourished, NAD HEENT: OP clear, mucus membranes moist SKIN: warm, dry. No rashes. Neuro: No focal deficits Musculoskeletal: Muscle strength 5/5 all ext Psychiatric: Mood and affect normal Neck: No JVD, no carotid bruits, no thyromegaly, no lymphadenopathy. Lungs:Clear bilaterally, no wheezes, rhonci, crackles Cardiovascular: Irreg irreg rate and rhythm. No murmurs, gallops or rubs. Abdomen:Soft. Bowel sounds present. Non-tender.  Extremities: Trace bilateral lower extremity edema. Pulses are 2 + in the bilateral DP/PT.  Echo 02/03/13: Left ventricle: The cavity size was normal. Wall thickness was normal. Systolic function was normal. The estimated ejection fraction was in the range of 50% to 55%. Doppler parameters are consistent with abnormal left ventricular relaxation (grade 1 diastolic dysfunction).  Assessment and Plan:   1. CAD: Stable. Continue beta blocker, statin. Will increase Lopressor to 100 mg po BID. Will stop ASA since we are starting Eliquis for atrial fib.   2. HTN: BP well controlled.  3. Hyperlipidemia: Continue statin. Will repeat lipids and LFTS in one month  4. Atrial fib: new onset.  Rate controlled. Long discussion today regarding atrial fib, rate control and stroke risk. Will start Eliquis 5 mg po BID. His creatinine is 2.5. but he is under 80yo and over 60 kg so this is the correct dosage. Will need at least one month of anti-coagulation before I would consider DCCV. If he is asymptomatic at f/u, may not need DCCV. Will plan f/u in 4 weeks.

## 2014-05-25 NOTE — Patient Instructions (Addendum)
Your physician recommends that you schedule a follow-up appointment in:  3-4 weeks with Dr. [name], NP or PA.     Your physician has recommended you make the following change in your medication:  Increase metoprolol tartrate to 100 mg by mouth twice daily Start Eliquis 5 mg by mouth twice daily Stop Aspirin

## 2014-05-28 ENCOUNTER — Encounter: Payer: Self-pay | Admitting: Internal Medicine

## 2014-05-28 ENCOUNTER — Ambulatory Visit (INDEPENDENT_AMBULATORY_CARE_PROVIDER_SITE_OTHER)
Admission: RE | Admit: 2014-05-28 | Discharge: 2014-05-28 | Disposition: A | Discharge status: Home / Self Care | Payer: Medicare PPO | Source: Ambulatory Visit | Attending: Internal Medicine | Admitting: Internal Medicine

## 2014-05-28 ENCOUNTER — Ambulatory Visit (INDEPENDENT_AMBULATORY_CARE_PROVIDER_SITE_OTHER): Payer: Medicare PPO | Admitting: Internal Medicine

## 2014-05-28 VITALS — BP 128/70 | HR 72 | Ht 68.0 in | Wt 200.0 lb

## 2014-05-28 DIAGNOSIS — J449 Chronic obstructive pulmonary disease, unspecified: Secondary | ICD-10-CM

## 2014-05-28 DIAGNOSIS — I10 Essential (primary) hypertension: Secondary | ICD-10-CM

## 2014-05-28 MED ORDER — BUDESONIDE-FORMOTEROL FUMARATE 160-4.5 MCG/ACT IN AERO: INHALATION_SPRAY | RESPIRATORY_TRACT | Status: DC

## 2014-05-28 NOTE — Assessment & Plan Note (Signed)
Trial off acei  11/30/2012 due to cough > resolved  Strongly prefer in this setting: Bystolic, the most beta -1  selective Beta blocker available in sample form, with bisoprolol the most selective generic choice  on the market. Especially with high dose lopressor the other option

## 2014-05-28 NOTE — Patient Instructions (Signed)
Try symbicort 160  Take 2 puffs first thing in am and then another 2 puffs about 12 hours later.   Only use your albuterol as a rescue medication to be used if you can't catch your breath by resting or doing a relaxed purse lip breathing pattern.  - The less you use it, the better it will work when you need it. - Ok to use up to 2 puffs  every 4 hours if you must but call for immediate appointment if use goes up over your usual need - Don't leave home without it !!  (think of it like the spare tire for your car)   Please remember to go to the x-ray department downstairs for your tests - we will call you with the results when they are available.  Please schedule a follow up office visit in 2 weeks, sooner if needed with all inhalers in hand

## 2014-05-28 NOTE — Progress Notes (Signed)
Subjective:    Patient ID: Terry [name], male    DOB: 14-Nov-1934  MRN: [id number]   Brief patient profile:  63 yowm quit smoking 2001 bothered by doe seemed better then worse in 2010 p "lung infection" and stayed on advair/spiriva  Combo since 2012 seemed better then admitted in [facility] with pna 10/09/12  in then transferred to [facility] NH x 3 weeks and discharged  On May 1st referred to pulmonary by [facility] for copd eval and proved to have GOLD III copd 01/26/13 with symptoms much better off ACEi   HPI 11/29/2012 1st pulmonary ov/ Terry [name]/ on ACEi/ cc doe x maybe a block of walking but not back to baseline and able to do 5 steps and has proaire but not needing. Also persistent dry daytime cough x months >d/c ACE     7/281/4 Follow up and Med review  Pt returns for 1 month follow up and med review  We reviewed all his meds organized them into a med calendar with pt education .  Pt is unclear on his medications and takes b/p meds "if my b/p is high"  Explained that is not a good idea. He replied that is what his doctor told him to do.  Says his cough and dyspnea are much improved. Cough is resolved and he feels much better.  pt reports breathing is doing well.  rec No change rx    Admission date: 04/12/2014 Discharge Date: 04/20/2014    Admission Diagnosis Chronic kidney disease, stage III (moderate) [N18.3] Hyperkalemia [E87.5] Left flank pain [R10.9] Cyst of left kidney [Q61.00] COPD mixed type [J44.9] Essential hypertension [I10] Abdominal pain [R10.9]   Discharge Diagnosis Chronic kidney disease, stage III (moderate) [N18.3] Hyperkalemia [E87.5] Left flank pain [R10.9] Cyst of left kidney [Q61.00] COPD mixed type [J44.9] Essential hypertension [I10] Abdominal pain [R10.9]   Principal Problem:  Left flank pain Active Problems:  COPD GOLD III  Chronic kidney disease, stage III (moderate)  CAD (coronary artery disease) of artery bypass graft  HTN  (hypertension)  Hyperkalemia   Hospital Course  1. Left flank pain - due to hemorrhage and a renal cyst repeat CT confirms, seen by urologist D/W Dr Alinda Money on 04-14-14, likely cystoscopy outpatient, will continue to follow closely his symptoms and renal function. CT showed some perinephric stranding on the left side, had ++ leukocytosis resolved on empiric IV Rocephin will switch to 4 more days of PO Ceftin on 04-20-14, follow with PCP, renal and urology. 2. Acute renal failure on top of chronic disease stage IV. Baseline creatinine around 2. Also had some bladder outlet obstruction. Foley was placed, he was hydrated, ACE inhibitor and Lasix held. Renal function has steadily improved. Appreciate nephrology input. We will follow post discharge with nephrology and PCP. 3. Hyponatremia - Per renal. Improving with self diuresis. Repeat BMP in 3-4 days. 4. Hypertension. Stable on beta blocker and Norvasc continue. ACE and Lasix discontinued due to acute renal failure. 5. CAD. No acute issue. Continue beta blocker, hold aspirin for now.  6.Constipation. Placed on bowel regimen and monitor.  7. Shortness of breath. Likely due to combination of mild fluid overload and questionable COPD exacerbation, much improved. The fluids were held and he is being self diuresing, also after a trial of IV steroids. Still requiring 2 L of nasal cannula oxygen down to 5. Much improved after pulmonary toiletry, he be discharged with nebulizer treatments on a steroid taper and home oxygen. Steadily improving with ongoing self diuresis  05/28/2014 transition of care/ post hosp f/u ov/Keona Sheffler re:  Chief Complaint  Patient presents with  . HFU    Breathing has improved some, but not back to his normal  baseline. He gets SOB walking minimal distances.    still on advair/ saba twice daily helps some - no symptoms at rest or sleeping   No obvious daytime variabilty or assoc chronic cough or cp or chest tightness, subjective  wheeze overt sinus or hb symptoms. No unusual exp hx or h/o childhood pna/ asthma or knowledge of premature birth.   Sleeping ok without nocturnal  or early am exacerbation  of respiratory  c/o's or need for noct saba. Also denies any obvious fluctuation of symptoms with weather or environmental changes or other aggravating or alleviating factors except as outlined above   Current Medications, Allergies, Past Medical History, Past Surgical History, Family History, and Social History were reviewed in [employer] record.  ROS  The following are not active complaints unless bolded sore throat, dysphagia, dental problems, itching, sneezing,  nasal congestion or excess/ purulent secretions, ear ache,   fever, chills, sweats, unintended wt loss, pleuritic or exertional cp, hemoptysis,  orthopnea pnd or leg swelling, presyncope, palpitations, heartburn, abdominal pain, anorexia, nausea, vomiting, diarrhea  or change in bowel or urinary habits, change in stools or urine, dysuria,hematuria,  rash, arthralgias, visual complaints, headache, numbness weakness or ataxia or problems with walking or coordination,  change in mood/affect or memory.            Objective:   Physical Exam  Somber slt hoarse  amb wm   08/15/2013       193 > 05/28/2014  200 Wt Readings from Last 3 Encounters:  01/26/13 182 lb (82.555 kg)  01/26/13 182 lb (82.555 kg)  01/16/13 181 lb 6.4 oz (82.283 kg)     HEENT mild turbinate edema.  Oropharynx no thrush or excess pnd or cobblestoning.  No JVD or cervical adenopathy. Mild accessory muscle hypertrophy. Trachea midline, nl thryroid. Chest  CTA bilaterally . Regular rate and rhythm without murmur gallop or rub or increase P2 or edema.  Abd: no hsm, nl excursion. Ext warm without cyanosis or clubbing.       CXR PA and Lateral:   05/28/2014 :  COPD. No acute abnormality.   Recent Labs Lab 05/25/14 1711  NA 135  K 4.1  CL 98  CO2 24  BUN 45*   CREATININE 2.62*  GLUCOSE 101*   Lab Results  Component Value Date   WBC 15.2* 04/26/2014   HGB 9.7* 04/26/2014   HCT 29.6* 04/26/2014   MCV 91.1 04/26/2014   PLT 262.0 04/26/2014     Lab Results  Component Value Date   TSH 0.87 12/01/2013     Lab Results  Component Value Date   PROBNP 420.6 04/13/2014         Assessment & Plan:

## 2014-05-28 NOTE — Assessment & Plan Note (Addendum)
-   Trial off ACEi 11/30/2012 >>> much better symptom control - PFT's 01/26/13  FEV1  1.28 (48%) ratio 48 and no better p B2,  DLCO 39 corrects to 48% - 05/28/2014   Walked RA x one lap @ 185 stopped due to  [employer] / sats ok/ mod pace  DDX of  difficult airways management all start with A and  include Adherence, Ace Inhibitors, Acid Reflux, Active Sinus Disease, Alpha 1 Antitripsin deficiency, Anxiety masquerading as Airways dz,  ABPA,  allergy(esp in young), Aspiration (esp in elderly), Adverse effects of DPI,  Active smokers, plus two Bs  = Bronchiectasis and Beta blocker use..and one C= CHF  Adherence is always the initial "prime suspect" and is a multilayered concern that requires a "trust but verify" approach in every patient - starting with knowing how to use medications, especially inhalers, correctly, keeping up with refills and understanding the fundamental difference between maintenance and prns vs those medications only taken for a very short course and then stopped and not refilled.  The proper method of use, as well as anticipated side effects, of a metered-dose inhaler are discussed and demonstrated to the patient. Improved effectiveness after extensive coaching during this visit to a level of approximately  75% from a baseline of < 25 % so rechallnge with symbicort 160 and consider turdorza or spiriva respimat next ov depending on how well he does with hfa   ? Beta blocker effects > concerned with high dose BB > Strongly prefer in this setting: Bystolic, the most beta -1  selective Beta blocker available in sample form, with bisoprolol the most selective generic choice  on the market.

## 2014-05-29 ENCOUNTER — Ambulatory Visit: Payer: Medicare PPO | Admitting: Gastroenterology

## 2014-05-29 NOTE — Progress Notes (Signed)
He was just found to be in afib by cardiology 3-4 days ago and started on eliquis.  We discussed the increased risk of colonoscopy now that his blood is thinner and heart with irregular rhythm.  We agreed to cancel today's colonoscopy and my office will schedule him for appt with me in 4-5 months to revisit the question of polyp surveillance, holding blood thinners if needed/safe for colonoscopy.

## 2014-05-31 ENCOUNTER — Telehealth: Payer: Self-pay | Admitting: Cardiovascular Disease

## 2014-05-31 NOTE — Telephone Encounter (Signed)
Spoke with pt. He reports he saw Dr. Melvyn Novas earlier this week and he was concerned about pt taking twice daily lopressor due to use of inhaler.  Pt reports he would like to continue lopressor if possible as he thinks it works well for him.  I told pt I would forward message to Dr. [name] who could review office note from recent visit with Dr. Melvyn Novas and then make a recommendation.

## 2014-05-31 NOTE — Telephone Encounter (Signed)
New message     Dr Melvyn Novas is concerned that pt takes metoprolol with his inhaler.  Will Dr [name] consider putting pt on a different heart medication?  Dr Melvyn Novas is supposed to be contacting Dr [name].  Please call pt

## 2014-06-01 ENCOUNTER — Encounter: Payer: Self-pay | Admitting: Family Medicine

## 2014-06-01 NOTE — Telephone Encounter (Signed)
Recommend continuing metoprolol given new onset atrial fib. His breathing seems to be at baseline. cdm

## 2014-06-01 NOTE — Telephone Encounter (Signed)
Spoke with pt and gave him information from Dr. McAlhany 

## 2014-06-11 ENCOUNTER — Ambulatory Visit: Payer: Medicare PPO | Admitting: Internal Medicine

## 2014-06-12 ENCOUNTER — Ambulatory Visit: Payer: Medicare PPO | Admitting: Internal Medicine

## 2014-06-18 ENCOUNTER — Ambulatory Visit: Payer: Medicare PPO | Admitting: Nurse Practitioner

## 2014-06-25 ENCOUNTER — Other Ambulatory Visit: Payer: Self-pay | Admitting: Cardiovascular Disease

## 2014-06-26 ENCOUNTER — Other Ambulatory Visit: Payer: Self-pay

## 2014-06-26 MED ORDER — ATORVASTATIN CALCIUM 10 MG PO TABS: 10.0000 mg | ORAL_TABLET | Freq: Every day | ORAL | Status: DC

## 2014-06-27 ENCOUNTER — Ambulatory Visit: Payer: Medicare PPO | Admitting: Internal Medicine

## 2014-07-03 ENCOUNTER — Other Ambulatory Visit: Payer: Medicare PPO

## 2014-07-04 ENCOUNTER — Ambulatory Visit (INDEPENDENT_AMBULATORY_CARE_PROVIDER_SITE_OTHER): Payer: Medicare PPO | Admitting: Internal Medicine

## 2014-07-04 ENCOUNTER — Encounter: Payer: Self-pay | Admitting: Internal Medicine

## 2014-07-04 VITALS — BP 112/64 | HR 70 | Ht 67.0 in | Wt 200.0 lb

## 2014-07-04 DIAGNOSIS — I10 Essential (primary) hypertension: Secondary | ICD-10-CM

## 2014-07-04 DIAGNOSIS — J449 Chronic obstructive pulmonary disease, unspecified: Secondary | ICD-10-CM

## 2014-07-04 NOTE — Patient Instructions (Addendum)
Strongly recommend rehab- call if you want [place] to refer you   Lopressor is a problem in high doses  given the severity of your lung disease and strongly recommend bisoprol instead- will let your heart doctor know  If you feel like you are losing ground and needing more rescue inhalations, return right away with all medications in hand and we will regroup.  [name]

## 2014-07-04 NOTE — Progress Notes (Signed)
Subjective:    Patient ID: Terry [name], male    DOB: August 03, 1934  MRN: [id number]   Brief patient profile:  55 yowm quit smoking 2001 bothered by doe seemed better then worse in 2010 p "lung infection" and stayed on advair/spiriva  Combo since 2012 seemed better then admitted in [facility] with pna 10/09/12  in then transferred to [facility] x 3 weeks and discharged  On May 1st referred to pulmonary by [facility] for copd eval and proved to have GOLD III copd 01/26/13 with symptoms much better off ACEi   HPI 11/29/2012 1st pulmonary ov/ Terry [name]/ on ACEi/ cc doe x maybe a block of walking but not back to baseline and able to do 5 steps and has proaire but not needing. Also persistent dry daytime cough x months >d/c ACE     7/281/4 Follow up and Med review  Pt returns for 1 month follow up and med review  We reviewed all his meds organized them into a med calendar with pt education .  Pt is unclear on his medications and takes b/p meds "if my b/p is high"  Explained that is not a good idea. He replied that is what his doctor told him to do.  Says his cough and dyspnea are much improved. Cough is resolved and he feels much better.  pt reports breathing is doing well.  rec No change rx    Admission date: 04/12/2014 Discharge Date: 04/20/2014    Admission Diagnosis Chronic kidney disease, stage III (moderate) [N18.3] Hyperkalemia [E87.5] Left flank pain [R10.9] Cyst of left kidney [Q61.00] COPD mixed type [J44.9] Essential hypertension [I10] Abdominal pain [R10.9]   Discharge Diagnosis Chronic kidney disease, stage III (moderate) [N18.3] Hyperkalemia [E87.5] Left flank pain [R10.9] Cyst of left kidney [Q61.00] COPD mixed type [J44.9] Essential hypertension [I10] Abdominal pain [R10.9]   Principal Problem:  Left flank pain  COPD GOLD III  Chronic kidney disease, stage III (moderate)  CAD (coronary artery disease) of artery bypass graft  HTN (hypertension)   Hyperkalemia   Hospital Course  1. Left flank pain - due to hemorrhage and a renal cyst repeat CT confirms, seen by urologist D/W Dr Alinda Money on 04-14-14, likely cystoscopy outpatient, will continue to follow closely his symptoms and renal function. CT showed some perinephric stranding on the left side, had ++ leukocytosis resolved on empiric IV Rocephin will switch to 4 more days of PO Ceftin on 04-20-14, follow with PCP, renal and urology. 2. Acute renal failure on top of chronic disease stage IV. Baseline creatinine around 2. Also had some bladder outlet obstruction. Foley was placed, he was hydrated, ACE inhibitor and Lasix held. Renal function has steadily improved. Appreciate nephrology input. We will follow post discharge with nephrology and PCP. 3. Hyponatremia - Per renal. Improving with self diuresis. Repeat BMP in 3-4 days. 4. Hypertension. Stable on beta blocker and Norvasc continue. ACE and Lasix discontinued due to acute renal failure. 5. CAD. No acute issue. Continue beta blocker, hold aspirin for now.  6.Constipation. Placed on bowel regimen and monitor.  7. Shortness of breath. Likely due to combination of mild fluid overload and questionable COPD exacerbation, much improved. The fluids were held and he is being self diuresing, also after a trial of IV steroids. Still requiring 2 L of nasal cannula oxygen down to 5. Much improved after pulmonary toiletry, he be discharged with nebulizer treatments on a steroid taper and home oxygen. Steadily improving with ongoing self diuresis   05/28/2014  transition of care/ post hosp f/u ov/Terry [name] re:  Chief Complaint  Patient presents with  . HFU    Breathing has improved some, but not back to his normal  baseline. He gets SOB walking minimal distances.    still on advair/ saba twice daily helps some - no symptoms at rest or sleeping  rec Try symbicort 160  Take 2 puffs first thing in am and then another 2 puffs about 12 hours later.  Only use  your albuterol as needed    07/04/2014 f/u ov/Terry [name] re: GOLD III copd / no meds/ no med calendar ? On advair hfa now  Chief Complaint  Patient presents with  . Follow-up    Pt states that his breathing continues to improve. No new co's.     Not limited by breathing from desired activities - denies noct problems or need for saba at all  "you told me to stop it, didn't you"?   No obvious daytime variabilty or assoc cp or chest tightness, subjective wheeze overt sinus or hb symptoms. No unusual exp hx or h/o childhood pna/ asthma or knowledge of premature birth.   Sleeping ok without nocturnal  or early am exacerbation  of respiratory  c/o's or need for noct saba. Also denies any obvious fluctuation of symptoms with weather or environmental changes or other aggravating or alleviating factors except as outlined above   Current Medications, Allergies, Past Medical History, Past Surgical History, Family History, and Social History were reviewed in [employer] record.  ROS  The following are not active complaints unless bolded sore throat, dysphagia, dental problems, itching, sneezing,  nasal congestion or excess/ purulent secretions, ear ache,   fever, chills, sweats, unintended wt loss, pleuritic or exertional cp, hemoptysis,  orthopnea pnd or leg swelling, presyncope, palpitations, heartburn, abdominal pain, anorexia, nausea, vomiting, diarrhea  or change in bowel or urinary habits, change in stools or urine, dysuria,hematuria,  rash, arthralgias, visual complaints, headache, numbness weakness or ataxia or problems with walking or coordination,  change in mood/affect or memory.            Objective:   Physical Exam  Somber slt hoarse  amb wm   08/15/2013       193 > 05/28/2014  200 > 07/04/2014  200 Wt Readings from Last 3 Encounters:  01/26/13 182 lb (82.555 kg)  01/26/13 182 lb (82.555 kg)  01/16/13 181 lb 6.4 oz (82.283 kg)     HEENT mild turbinate edema.   Oropharynx no thrush or excess pnd or cobblestoning.  No JVD or cervical adenopathy. Mild accessory muscle hypertrophy. Trachea midline, nl thryroid. Chest  CTA bilaterally . Regular rate and rhythm without murmur gallop or rub or increase P2 or edema.  Abd: no hsm, nl excursion. Ext warm without cyanosis or clubbing.       CXR PA and Lateral:   05/28/2014 :  COPD. No acute abnormality.   Recent Labs Lab 05/25/14 1711  NA 135  K 4.1  CL 98  CO2 24  BUN 45*  CREATININE 2.62*  GLUCOSE 101*   Lab Results  Component Value Date   WBC 15.2* 04/26/2014   HGB 9.7* 04/26/2014   HCT 29.6* 04/26/2014   MCV 91.1 04/26/2014   PLT 262.0 04/26/2014     Lab Results  Component Value Date   TSH 0.87 12/01/2013     Lab Results  Component Value Date   PROBNP 420.6 04/13/2014  Assessment & Plan:

## 2014-07-05 ENCOUNTER — Telehealth: Payer: Self-pay | Admitting: Internal Medicine

## 2014-07-05 DIAGNOSIS — J449 Chronic obstructive pulmonary disease, unspecified: Secondary | ICD-10-CM

## 2014-07-05 NOTE — Telephone Encounter (Signed)
Pt's wife is aware that handicap placard is at front desk for pick up. Nothing further was needed.

## 2014-07-05 NOTE — Telephone Encounter (Signed)
lmtcb for pt.  

## 2014-07-05 NOTE — Telephone Encounter (Signed)
Pt would like to have handicap placard that was mentioned at this appointment yesterday. Would you be okay with doing this?  MW - please advise. Thanks.

## 2014-07-05 NOTE — Telephone Encounter (Signed)
Yes

## 2014-07-05 NOTE — Telephone Encounter (Signed)
Pt's wife returned call - cell [phone]

## 2014-07-05 NOTE — Telephone Encounter (Signed)
ok 

## 2014-07-05 NOTE — Telephone Encounter (Signed)
Order will be placed for d/c. Pt's wife is aware that we will take care of this.

## 2014-07-05 NOTE — Telephone Encounter (Signed)
Spoke with pt wife, wanting an order placed to D/C O2 Needing order placed to [facility] to pick up O2 tanks and concentrator. Requesting order be faxed to [phone] Please advise Dr Melvyn Novas. Thanks.

## 2014-07-09 NOTE — Assessment & Plan Note (Signed)
Trial off acei  11/30/2012 due to cough > resolved  Adequate control on present rx, reviewed > no change in rx needed  For now but Strongly prefer in this setting: Bystolic, the mo st beta -1  selective Beta blocker available in sample form, with bisoprolol the most selective generic choice  on the market over high doses of lopressor

## 2014-07-09 NOTE — Assessment & Plan Note (Signed)
-   Trial off ACEi 11/30/2012 >>> much better symptom control - PFT's 01/26/13  FEV1  1.28 (48%) ratio 48 and no better p B2,  DLCO 39 corrects to 48% - 05/28/2014   Walked RA x one lap @ 185 stopped due to  [employer] / sats ok/ mod pace - hfa 75% p coaching 05/28/2014  > rechallenge with symbicort 160 2bid and spiriva   He is doing much better but thoroughly confused with specifics of care  I had an extended discussion with the patient reviewing all relevant studies completed to date and  lasting 15 to 20 minutes of a 25 minute visit on the following ongoing concerns:   As I explained to this patient in detail:  although there is copd present, it may not be clinically relevant:   it does not appear to be limiting activity tolerance any more than a set of worn tires limits someone from driving a car  around a parking lot.  A new set of Michelins might look good but would have no perceived impact on the performance of the car and would not be worth the cost.  That is to say:   this pt is so sedentary I don't recommend aggressive pulmonary rx at this point unless limiting symptoms arise or acute exacerbations become as issue, neither of which is the case now.  I asked the patient to contact this office at any time in the future should either of these problems arise.   He is a good candidate for rehab if he'll agree to go  See comments under hbp re bp management   Pulmonary f/u can be prn

## 2014-07-13 ENCOUNTER — Other Ambulatory Visit: Payer: Self-pay | Admitting: Cardiovascular Disease

## 2014-07-13 ENCOUNTER — Telehealth: Payer: Self-pay | Admitting: Nurse Practitioner

## 2014-07-13 MED ORDER — DOXAZOSIN MESYLATE 4 MG PO TABS: 4.0000 mg | ORAL_TABLET | Freq: Every day | ORAL | Status: DC

## 2014-07-13 MED ORDER — TORSEMIDE 10 MG PO TABS: 10.0000 mg | ORAL_TABLET | Freq: Every day | ORAL | Status: DC

## 2014-07-13 NOTE — Telephone Encounter (Signed)
Patient called this afternoon asking for refill for Torsemide and Doxazosin.   Both refills sent to his pharmacy.  Burtis Junes, RN, [facility] [address] [place], [place]  [address] [phone]

## 2014-07-21 DIAGNOSIS — J449 Chronic obstructive pulmonary disease, unspecified: Secondary | ICD-10-CM | POA: Diagnosis not present

## 2014-08-07 ENCOUNTER — Telehealth: Payer: Self-pay | Admitting: Internal Medicine

## 2014-08-07 NOTE — Telephone Encounter (Signed)
LMTCB

## 2014-08-07 NOTE — Telephone Encounter (Signed)
Best not ever no change meds between visits as easy to confuse and they are not the same dose (in the event of bisoprolol) or same dose (in case of advair vs symbicort) so needs ov with all meds in hand to see Tammy first

## 2014-08-07 NOTE — Telephone Encounter (Signed)
Spoke with the pt He is calling to get the name of the alternative to lopressor that was mentioned at his last visit  I advised it is bisoprolol  He verbalized understanding  He wants to know if he gets switched from lopressor to bisoprolol, if he can switch from advair to symbicort  He sounds confused about meds  He states that he remembers MW saying that symbicort is better  Please advise thanks

## 2014-08-08 ENCOUNTER — Telehealth: Payer: Self-pay | Admitting: Cardiovascular Disease

## 2014-08-08 NOTE — Telephone Encounter (Signed)
Pt c/o medication issue:  1. Name of Medication: Metoprolol  2. How are you currently taking this medication (dosage and times per day)? 100 mg 2x a day  3. Are you having a reaction (difficulty breathing--STAT)? No  4. What is your medication issue? Pt calling stating that his Pulmonologist Dr. Melvyn Novas says that Veisiprol is more compatable with the medication he is taking for his lungs which is Advair (will soon be switched to Symbicort)

## 2014-08-08 NOTE — Telephone Encounter (Signed)
Called patient to get a clearer understanding of his question. Looking back over Dr. Gustavus Bryant (pulmonologist) note patient wants to be on symbicort instead of advair. In order to do this patient wants to change from metoprolol to bisoprolol because it works better with symbicort (according to Dr.Wert). Below is Dr. Gustavus Bryant note. When the  pulmonology office called him back, he stated he was waiting to here back from Dr. [name]. Will forward to Dr. [name] for further instructions.    Tanda Rockers, MD at 08/07/2014 4:24 PM     Status: Signed       Expand All Collapse All   Best not ever no change meds between visits as easy to confuse and they are not the same dose (in the event of bisoprolol) or same dose (in case of advair vs symbicort) so needs ov with all meds in hand to see Tammy first

## 2014-08-08 NOTE — Telephone Encounter (Signed)
Called and spoke to pt. Informed pt of the recs per MW. Pt stated he hasn't heard back from Dr. [name] about the change to Bisoprolol. Pt wants to wait to hear from Dr. [name] before scheduling and appt with TP. Pt stated he will try and call us back today or tomorrow with an update.

## 2014-08-09 NOTE — Telephone Encounter (Signed)
Can we let Mr. Burgos know that I will discuss this with Dr. Melvyn Novas. He is on high dose of Lopressor for rate control of his atrial fibrillation. I am not sure we can get good rate control by switching to Bystolic or bisoprolol. I would not make this change right now. [name]

## 2014-08-10 NOTE — Telephone Encounter (Signed)
Called and spoke to pt. Informed pt of the recs per MW. Pt stated he was told by Dr. [name] that he is to stay on the Metoprolol. Appt made with TP on 08/20/2014. Pt verbalized understanding and denied any further questions or concerns at this time.

## 2014-08-10 NOTE — Telephone Encounter (Signed)
Spoke with pt and gave him information from Dr. McAlhany 

## 2014-08-10 NOTE — Telephone Encounter (Signed)
Spoke with pt and gave him information from Dr. [name]. Pt states he is doing well on metoprolol and would like to continue this if OK with physicians.

## 2014-08-10 NOTE — Telephone Encounter (Signed)
I have communicated with Dr. Melvyn Novas and since he is stable from a pulmonary standpoint, will continue Lopressor for now.

## 2014-08-19 ENCOUNTER — Other Ambulatory Visit: Payer: Self-pay | Admitting: Cardiovascular Disease

## 2014-08-20 ENCOUNTER — Ambulatory Visit (INDEPENDENT_AMBULATORY_CARE_PROVIDER_SITE_OTHER): Payer: Medicare PPO | Admitting: Adult Health

## 2014-08-20 ENCOUNTER — Encounter: Payer: Self-pay | Admitting: Adult Health

## 2014-08-20 VITALS — BP 138/78 | HR 65 | Temp 97.8°F | Ht 67.0 in | Wt 205.4 lb

## 2014-08-20 DIAGNOSIS — J449 Chronic obstructive pulmonary disease, unspecified: Secondary | ICD-10-CM | POA: Diagnosis not present

## 2014-08-20 MED ORDER — MOMETASONE FURO-FORMOTEROL FUM 200-5 MCG/ACT IN AERO: 2.0000 | INHALATION_SPRAY | Freq: Every day | RESPIRATORY_TRACT | Status: DC

## 2014-08-20 NOTE — Assessment & Plan Note (Signed)
May try Dulera 200 2 puffs Twice daily  , rinse after use.  Continue on Spiriva daily .  Follow up Dr. Melvyn Novas  In 2 months and As needed

## 2014-08-20 NOTE — Patient Instructions (Addendum)
May try Dulera 200 2 puffs Twice daily  , rinse after use.  Continue on Spiriva daily .  Follow up Dr. Melvyn Novas  In 2 months and As needed

## 2014-08-20 NOTE — Progress Notes (Signed)
Subjective:    Patient ID: Terry [name], male    DOB: 01/02/1935  MRN: [id number]   Brief patient profile:  80 yowm quit smoking 2001 bothered by doe seemed better then worse in 2010 p "lung infection" and stayed on advair/spiriva  Combo since 2012 seemed better then admitted in [facility] with pna 10/09/12  in then transferred to [facility] x 3 weeks and discharged  On May 1st referred to pulmonary by [facility] for copd eval and proved to have GOLD III copd 01/26/13 with symptoms much better off ACEi   HPI 11/29/2012 1st pulmonary ov/ Wert/ on ACEi/ cc doe x maybe a block of walking but not back to baseline and able to do 5 steps and has proaire but not needing. Also persistent dry daytime cough x months >d/c ACE     7/281/4 Follow up and Med review  Pt returns for 1 month follow up and med review  We reviewed all his meds organized them into a med calendar with pt education .  Pt is unclear on his medications and takes b/p meds "if my b/p is high"  Explained that is not a good idea. He replied that is what his doctor told him to do.  Says his cough and dyspnea are much improved. Cough is resolved and he feels much better.  pt reports breathing is doing well.  rec No change rx    Admission date: 04/12/2014 Discharge Date: 04/20/2014    Admission Diagnosis Chronic kidney disease, stage III (moderate) [N18.3] Hyperkalemia [E87.5] Left flank pain [R10.9] Cyst of left kidney [Q61.00] COPD mixed type [J44.9] Essential hypertension [I10] Abdominal pain [R10.9]   Discharge Diagnosis Chronic kidney disease, stage III (moderate) [N18.3] Hyperkalemia [E87.5] Left flank pain [R10.9] Cyst of left kidney [Q61.00] COPD mixed type [J44.9] Essential hypertension [I10] Abdominal pain [R10.9]   Principal Problem:  Left flank pain  COPD GOLD III  Chronic kidney disease, stage III (moderate)  CAD (coronary artery disease) of artery bypass graft  HTN (hypertension)   Hyperkalemia   Hospital Course  1. Left flank pain - due to hemorrhage and a renal cyst repeat CT confirms, seen by urologist D/W Dr Alinda Money on 04-14-14, likely cystoscopy outpatient, will continue to follow closely his symptoms and renal function. CT showed some perinephric stranding on the left side, had ++ leukocytosis resolved on empiric IV Rocephin will switch to 4 more days of PO Ceftin on 04-20-14, follow with PCP, renal and urology. 2. Acute renal failure on top of chronic disease stage IV. Baseline creatinine around 2. Also had some bladder outlet obstruction. Foley was placed, he was hydrated, ACE inhibitor and Lasix held. Renal function has steadily improved. Appreciate nephrology input. We will follow post discharge with nephrology and PCP. 3. Hyponatremia - Per renal. Improving with self diuresis. Repeat BMP in 3-4 days. 4. Hypertension. Stable on beta blocker and Norvasc continue. ACE and Lasix discontinued due to acute renal failure. 5. CAD. No acute issue. Continue beta blocker, hold aspirin for now.  6.Constipation. Placed on bowel regimen and monitor.  7. Shortness of breath. Likely due to combination of mild fluid overload and questionable COPD exacerbation, much improved. The fluids were held and he is being self diuresing, also after a trial of IV steroids. Still requiring 2 L of nasal cannula oxygen down to 5. Much improved after pulmonary toiletry, he be discharged with nebulizer treatments on a steroid taper and home oxygen. Steadily improving with ongoing self diuresis   05/28/2014  transition of care/ post hosp f/u ov/Wert re:  Chief Complaint  Patient presents with  . HFU    Breathing has improved some, but not back to his normal  baseline. He gets SOB walking minimal distances.    still on advair/ saba twice daily helps some - no symptoms at rest or sleeping  rec Try symbicort 160  Take 2 puffs first thing in am and then another 2 puffs about 12 hours later.  Only use  your albuterol as needed    07/04/2014 f/u ov/Wert re: GOLD III copd / no meds/ no med calendar ? On advair hfa now  Chief Complaint  Patient presents with  . Follow-up    Pt states that his breathing continues to improve. No new co's.     Not limited by breathing from desired activities - denies noct problems or need for saba at all  "you told me to stop it, didn't you"? >>no changes   08/20/2014 Follow up COPD  Patient returns for a 6 week follow-up. Patient reports that he has good days and bad. Patient was previously on [facility] after his last hospitalization and would like to try this inhaler instead of Advair He feels that he had improved control of his breathing on this He denies any chest pain, orthopnea, PND, increased leg swelling or hemoptysis. Chest x-ray last visit with COPD changes. No acute process noted.      Current Medications, Allergies, Past Medical History, Past Surgical History, Family History, and Social History were reviewed in [employer] record.  ROS  The following are not active complaints unless bolded sore throat, dysphagia, dental problems, itching, sneezing,  nasal congestion or excess/ purulent secretions, ear ache,   fever, chills, sweats, unintended wt loss, pleuritic or exertional cp, hemoptysis,  orthopnea pnd or leg swelling, presyncope, palpitations, heartburn, abdominal pain, anorexia, nausea, vomiting, diarrhea  or change in bowel or urinary habits, change in stools or urine, dysuria,hematuria,  rash, arthralgias, visual complaints, headache, numbness weakness or ataxia or problems with walking or coordination,  change in mood/affect or memory.            Objective:   Physical Exam  Somber slt hoarse  amb wm   08/15/2013       193 > 05/28/2014  200 > 07/04/2014  200>205 08/20/2014     HEENT mild turbinate edema.  Oropharynx no thrush or excess pnd or cobblestoning.  No JVD or cervical adenopathy. Mild accessory muscle  hypertrophy. Trachea midline, nl thryroid. Chest  CTA bilaterally . Regular rate and rhythm without murmur gallop or rub or increase P2 or edema.  Abd: no hsm, nl excursion. Ext warm without cyanosis or clubbing.       CXR PA and Lateral:   05/28/2014 :  COPD. No acute abnormality.   Recent Labs Lab 05/25/14 1711  NA 135  K 4.1  CL 98  CO2 24  BUN 45*  CREATININE 2.62*  GLUCOSE 101*   Lab Results  Component Value Date   WBC 15.2* 04/26/2014   HGB 9.7* 04/26/2014   HCT 29.6* 04/26/2014   MCV 91.1 04/26/2014   PLT 262.0 04/26/2014     Lab Results  Component Value Date   TSH 0.87 12/01/2013     Lab Results  Component Value Date   PROBNP 420.6 04/13/2014         Assessment & Plan:

## 2014-08-27 ENCOUNTER — Other Ambulatory Visit: Payer: Self-pay | Admitting: Internal Medicine

## 2014-08-28 ENCOUNTER — Ambulatory Visit (INDEPENDENT_AMBULATORY_CARE_PROVIDER_SITE_OTHER): Payer: Medicare PPO | Admitting: Gastroenterology

## 2014-08-28 ENCOUNTER — Encounter: Payer: Self-pay | Admitting: Gastroenterology

## 2014-08-28 VITALS — Ht 67.5 in | Wt 203.5 lb

## 2014-08-28 DIAGNOSIS — D126 Benign neoplasm of colon, unspecified: Secondary | ICD-10-CM

## 2014-08-28 DIAGNOSIS — I4891 Unspecified atrial fibrillation: Secondary | ICD-10-CM

## 2014-08-28 NOTE — Progress Notes (Signed)
Review of pertinent gastrointestinal problems: 1. Adenomatous colon polyps: Colonoscopy [facility] Dr. Etta Quill, 01/2011, done for "high risk colon cancer screening, personal history of adenomatous polyps." Findings 3 polyps were removed (52mm, 30mm, 64mm), all were adenomatous on pathology; also severe diverticulosis in sigmoid. He was recommended to discuss elective sigmoid resection with surgery, he was recommended to have repeat colonoscopy in 3 years (on letter written to him).  HPI: This is a    very pleasant 80 year old man whom I am meeting in the office for the first time today.  He was initially set up to have colonoscopy for polyp surveillance as a direct procedure about 3 months ago. 2-3 days prior to that procedure he was started on a blood thinner for new atrial fibrillation. The day of the procedure were explained the risks to him now that he was on a blood thinner decided to cancel the case at that time.  Still on eloquis.  Has not seen his cardiologist yet, he does not have an appointment scheduled yet.  He missed a follow-up appointment in the end of December and has not rescheduled since then.  He has COPD, Gold stage III  He cut a finger on his left hand 2 weeks ago and it bled for a very long time.  Review of systems: Pertinent positive and negative review of systems were noted in the above HPI section. Complete review of systems was performed and was otherwise normal.    Past Medical History  Diagnosis Date  . Stroke 2012  . High cholesterol   . Hypertension   . Kidney disease   . COPD (chronic obstructive pulmonary disease)   . CAD (coronary artery disease) [place], [name]  . Acute and chronic respiratory failure 10/22/2012  . Osteoarthritis 10/22/2012    Past Surgical History  Procedure Laterality Date  . Coronary stent placement  1999    2 stents  . Appendectomy  1984  . Colon surgery  2012    partial colon removed - twisted bowel  . Hernia repair     Right inguinal  . Cystoscopy/retrograde/ureteroscopy Bilateral 04/23/2014    Procedure: CYSTOSCOPY BILATERAL RETROGRADE,  LEFT URETEROSCOPY, LEFT STENT PLACEMENT;  Surgeon: Raynelle Bring, MD;  Location: [facility];  Service: Urology;  Laterality: Bilateral;    Current Outpatient Prescriptions  Medication Sig Dispense Refill  . amLODipine (NORVASC) 10 MG tablet TAKE ONE TABLET BY MOUTH ONCE DAILY IN THE MORNING 30 tablet 0  . apixaban (ELIQUIS) 5 MG TABS tablet Take 1 tablet (5 mg total) by mouth 2 (two) times daily. 60 tablet 3  . atorvastatin (LIPITOR) 10 MG tablet Take 1 tablet (10 mg total) by mouth daily. 30 tablet 2  . doxazosin (CARDURA) 4 MG tablet Take 1 tablet (4 mg total) by mouth daily. 30 tablet 3  . metoprolol (LOPRESSOR) 100 MG tablet Take 1 tablet (100 mg total) by mouth 2 (two) times daily. 180 tablet 3  . mometasone-formoterol (DULERA) 200-5 MCG/ACT AERO Inhale 2 puffs into the lungs daily. 1 Inhaler 0  . SPIRIVA HANDIHALER 18 MCG inhalation capsule INHALE THE CONTENTS OF ONE CAPSULE ONCE DAILY 30 capsule 6  . torsemide (DEMADEX) 20 MG tablet Take 20 mg by mouth daily.      No current facility-administered medications for this visit.    Allergies as of 08/28/2014 - Review Complete 08/28/2014  Allergen Reaction Noted  . Levaquin [levofloxacin in d5w] Itching 05/28/2014  . Yellow dyes (non-tartrazine) Other (See Comments) 04/22/2014  Family History  Problem Relation Age of Onset  . Cancer Mother   . Heart disease Father   . Heart attack Father   . Cancer Sister   . Parkinson's disease Brother     History   Social History  . Marital Status: Married    Spouse Name: N/A  . Number of Children: 3  . Years of Education: N/A   Occupational History  . retired-worked in radio    Social History Main Topics  . Smoking status: Former Smoker -- 1.00 packs/day for 49 years    Types: Cigarettes, Pipe, Cigars    Quit date: 02/21/2000  . Smokeless tobacco: Never Used  .  Alcohol Use: No  . Drug Use: No  . Sexual Activity: Not on file   Other Topics Concern  . Not on file   Social History Narrative   Work or School: retired [employer] Situation: lives with wife in [place] Beliefs: [place] regular; poor diet                Physical Exam: Ht 5' 7.5" (1.715 m)  Wt 203 lb 8 oz (92.307 kg)  BMI 31.38 kg/m2 Constitutional: generally well-appearing Psychiatric: alert and oriented x3 Eyes: extraocular movements intact Mouth: oral pharynx moist, no lesions Neck: supple no lymphadenopathy Cardiovascular: heart regular rate and rhythm Lungs: clear to auscultation bilaterally Abdomen: soft, nontender, nondistended, no obvious ascites, no peritoneal signs, normal bowel sounds Extremities: no lower extremity edema bilaterally Skin: no lesions on visible extremities    Assessment and plan: 80 y.o. male with  personal history of adenomatous colon polyps, COPD Gold stage III, new atrial fibrillation  He was breathing with pursed lips several time during today's visit. He tells me he does not get short of breath as long as he is sitting still. He has not follow-up with his cardiologist for his new diagnosis of atrial fibrillation. We are going to expedite that cardiology return appointment as best as possible. In terms of polyp surveillance, he is 68 with bad lungs and current need for blood thinner for atrial fibrillation. I explained to him that I don't think he should be undergoing endoscopic procedures unless there are urgent, emergent given his COPD. I do feel he understands the difference between proceeding with colonoscopy for symptoms versus proceeding with colonoscopy for polyp surveillance. He will call here if he has significant new GI issues.   Cc: [name], DO

## 2014-08-28 NOTE — Patient Instructions (Addendum)
We have scheduled an appointment for you to see Nena Polio, PA-C at [facility] location) on Monday 4/4 @ 10:00 am.  No plans for colonoscopy unless significant GI symptoms arise (because of your COPD Stage III).

## 2014-09-20 ENCOUNTER — Telehealth: Payer: Self-pay | Admitting: Adult Health

## 2014-09-20 MED ORDER — MOMETASONE FURO-FORMOTEROL FUM 200-5 MCG/ACT IN AERO: 2.0000 | INHALATION_SPRAY | Freq: Two times a day (BID) | RESPIRATORY_TRACT | Status: DC

## 2014-09-20 NOTE — Telephone Encounter (Signed)
Called spoke with pt. Aware RX has been sent

## 2014-09-21 ENCOUNTER — Other Ambulatory Visit: Payer: Self-pay | Admitting: Cardiovascular Disease

## 2014-09-24 ENCOUNTER — Other Ambulatory Visit: Payer: Self-pay | Admitting: Internal Medicine

## 2014-09-24 ENCOUNTER — Telehealth: Payer: Self-pay | Admitting: Internal Medicine

## 2014-09-24 ENCOUNTER — Ambulatory Visit: Payer: Medicare PPO | Admitting: Physician Assistant

## 2014-09-24 NOTE — Telephone Encounter (Signed)
Per [name], she does not have any forms pertaining to this medications - needs lower copay on Dulera.  Spoke with patient, states that he spoke with insurance and they were sending letter today regarding getting a lower copay on his inhaler.   Please advise Terry [name] if you have received any papers on this patient. If not we will have to contact the insurance company to find out whats needed. Thanks.

## 2014-09-25 MED ORDER — MOMETASONE FURO-FORMOTEROL FUM 200-5 MCG/ACT IN AERO: 2.0000 | INHALATION_SPRAY | Freq: Two times a day (BID) | RESPIRATORY_TRACT | Status: DC

## 2014-09-25 NOTE — Telephone Encounter (Signed)
Spoke with pt, advised that PA was still being worked on and often take several days before a decision is made.  Samples placed up front of Dulera for pt.  Will hold message in triage because of the PA.

## 2014-09-25 NOTE — Telephone Encounter (Signed)
I checked fax up front and all of MW's lookat and did not see anything on this pt, sorry

## 2014-09-25 NOTE — Telephone Encounter (Signed)
Pt calling back to check on PA, pt is also asking if we have samples of advair ir if script can be called in to the [facility] in [place] please advise pt of whats  Being done.Hillery Hunter

## 2014-09-25 NOTE — Telephone Encounter (Signed)
Form was in blue PA folder in triage.  Form filled out and given to Dr Melvyn Novas to sign.

## 2014-09-26 NOTE — Telephone Encounter (Signed)
PA completed and faxed to [facility] Will hold in my basket until approval/denial  Pt aware

## 2014-09-27 NOTE — Telephone Encounter (Signed)
Spoke with Clarita Crane representative [place] PA denied  It is a Tier 4 and will cost $275. She made aware patient has tried and failed Symbicort, Advair and Spiriva. Alternative is [employer].

## 2014-09-27 NOTE — Telephone Encounter (Signed)
Called spoke with pt and samples of dulera left for pick up. appt scheduled for 5/2 at 3. Aware to rbing meds. Reports does not have med calendar

## 2014-09-27 NOTE — Telephone Encounter (Signed)
Give him 2 boxes of dulera 200 from my bottom drawer and f/u with me in 4 weeks with all meds and med calendar in hand and we'll regroup

## 2014-09-28 ENCOUNTER — Telehealth: Payer: Self-pay | Admitting: *Deleted

## 2014-09-28 NOTE — Telephone Encounter (Signed)
Terry [name] is considered Tier 4 drug. Alternative is Breo. Dr. Melvyn Novas decided to give patient samples until May 2016 appt and then decide which route. Nothing further needed but will hold onto PA.

## 2014-10-04 ENCOUNTER — Other Ambulatory Visit (HOSPITAL_COMMUNITY): Payer: Self-pay | Admitting: Urology

## 2014-10-04 DIAGNOSIS — N281 Cyst of kidney, acquired: Secondary | ICD-10-CM

## 2014-10-18 ENCOUNTER — Other Ambulatory Visit: Payer: Self-pay | Admitting: Cardiovascular Disease

## 2014-10-22 ENCOUNTER — Ambulatory Visit (INDEPENDENT_AMBULATORY_CARE_PROVIDER_SITE_OTHER): Payer: Medicare PPO | Admitting: Internal Medicine

## 2014-10-22 ENCOUNTER — Encounter: Payer: Self-pay | Admitting: Internal Medicine

## 2014-10-22 DIAGNOSIS — J449 Chronic obstructive pulmonary disease, unspecified: Secondary | ICD-10-CM

## 2014-10-22 NOTE — Assessment & Plan Note (Addendum)
-   Trial off ACEi 11/30/2012 >>> much better symptom control - PFT's 01/26/13  FEV1  1.28 (48%) ratio 48 and no better p B2,  DLCO 39 corrects to 48% - 05/28/2014   Walked RA x one lap @ 185 stopped due to  [employer] / sats ok/ mod pace   The proper method of use, as well as anticipated side effects, of a metered-dose inhaler are discussed and demonstrated to the patient. Improved effectiveness after extensive coaching during this visit to a level of approximately      90%    Despite GOLD III criteria doing much better on dulera 200 2bid and spiriva with min use of saba  I had an extended discussion with the patient reviewing all relevant studies completed to date and  lasting 15 to 20 minutes of a 25 minute visit on the following ongoing concerns:   1)  I reviewed the Flethcher curve with patient that basically indicates  if you quit smoking when your best day FEV1 is still well relatively well  preserved (as is the case here)  it is highly unlikely you will progress to severe disease and informed the patient there was no medication on the market that has proven to change the curve or the likelihood of progression.  Therefore stopping smoking and maintaining abstinence is the most important aspect of care, not choice of inhalers or for that matter, doctors.    2) needs one more set of pfts to establish 2 points on the curve and prove #1 and after than f/u here prn  3) Each maintenance medication was reviewed in detail including most importantly the difference between maintenance and as needed and under what circumstances the prns are to be used.  Please see instructions for details which were reviewed in writing and the patient given a copy.

## 2014-10-22 NOTE — Progress Notes (Signed)
Subjective:    Patient ID: Terry [name], male    DOB: [birth year]  MRN: [id number]   Brief patient profile:  19 yowm quit smoking 2001 bothered by doe seemed better then worse in 2010 p "lung infection" and stayed on advair/spiriva  Combo since 2012 seemed better then admitted in [facility] with pna 10/09/12  in then transferred to [facility] x 3 weeks and discharged  On May 1st  2014 referred to pulmonary clinic 11/29/12  by [facility] for copd eval and proved to have GOLD III copd 01/26/13 with symptoms much better off ACEi.   HPI 11/29/2012 1st pulmonary ov/ Terry [name]/ on ACEi/ cc doe x maybe a block of walking but not back to baseline and able to do 5 steps and has proaire but not needing. Also persistent dry daytime cough x months >d/c ACE    Admission date: 04/12/2014 Discharge Date: 04/20/2014    Admission Diagnosis Chronic kidney disease, stage III (moderate) [N18.3] Hyperkalemia [E87.5] Left flank pain [R10.9] Cyst of left kidney [Q61.00] COPD mixed type [J44.9] Essential hypertension [I10] Abdominal pain [R10.9]   Discharge Diagnosis Chronic kidney disease, stage III (moderate) [N18.3] Hyperkalemia [E87.5] Left flank pain [R10.9] Cyst of left kidney [Q61.00] COPD mixed type [J44.9] Essential hypertension [I10] Abdominal pain [R10.9]   Principal Problem:  Left flank pain  COPD GOLD III  Chronic kidney disease, stage III (moderate)  CAD (coronary artery disease) of artery bypass graft  HTN (hypertension)  Hyperkalemia   Hospital Course  1. Left flank pain - due to hemorrhage and a renal cyst repeat CT confirms, seen by urologist D/W Dr Alinda Money on 04-14-14, likely cystoscopy outpatient, will continue to follow closely his symptoms and renal function. CT showed some perinephric stranding on the left side, had ++ leukocytosis resolved on empiric IV Rocephin will switch to 4 more days of PO Ceftin on 04-20-14, follow with PCP, renal and urology. 2. Acute renal  failure on top of chronic disease stage IV. Baseline creatinine around 2. Also had some bladder outlet obstruction. Foley was placed, he was hydrated, ACE inhibitor and Lasix held. Renal function has steadily improved. Appreciate nephrology input. We will follow post discharge with nephrology and PCP. 3. Hyponatremia - Per renal. Improving with self diuresis. Repeat BMP in 3-4 days. 4. Hypertension. Stable on beta blocker and Norvasc continue. ACE and Lasix discontinued due to acute renal failure. 5. CAD. No acute issue. Continue beta blocker, hold aspirin for now.  6.Constipation. Placed on bowel regimen and monitor.  7. Shortness of breath. Likely due to combination of mild fluid overload and questionable COPD exacerbation, much improved. The fluids were held and he is being self diuresing, also after a trial of IV steroids. Still requiring 2 L of nasal cannula oxygen down to 5. Much improved after pulmonary toiletry, he be discharged with nebulizer treatments on a steroid taper and home oxygen. Steadily improving with ongoing self diuresis   05/28/2014 transition of care/ post hosp f/u ov/Terry [name] re:  Chief Complaint  Patient presents with  . HFU    Breathing has improved some, but not back to his normal  baseline. He gets SOB walking minimal distances.    still on advair/ saba twice daily helps some - no symptoms at rest or sleeping  rec Try symbicort 160  Take 2 puffs first thing in am and then another 2 puffs about 12 hours later.  Only use your albuterol as needed   08/20/2014 NP  Follow up COPD  Patient  returns for a 6 week follow-up. Patient reports that he has good days and bad. Patient was previously on [facility] after his last hospitalization and would like to try this inhaler instead of Advair He feels that he had improved control of his breathing on this He denies any chest pain, orthopnea, PND, increased leg swelling or hemoptysis. Chest x-ray last visit with COPD changes. No acute  process noted. rec May try Dulera 200 2 puffs Twice daily  , rinse after use.  Continue on Spiriva daily .    10/22/2014 f/u ov/Terry [name] re: GOLD III on dulera 200 2 bid and spiriva/ no need for saba / not using med calendar  Chief Complaint  Patient presents with  . Follow-up    Pt states that his breathing has improved since the last visit.   doe improves and now able to walk mailbox and back s stopping   = MMRC1   No obvious day to day or daytime variabilty or assoc chronic cough or cp or chest tightness, subjective wheeze overt sinus or hb symptoms. No unusual exp hx or h/o childhood pna/ asthma or knowledge of premature birth.  Sleeping ok without nocturnal  or early am exacerbation  of respiratory  c/o's or need for noct saba. Also denies any obvious fluctuation of symptoms with weather or environmental changes or other aggravating or alleviating factors except as outlined above   Current Medications, Allergies, Complete Past Medical History, Past Surgical History, Family History, and Social History were reviewed in [employer] record.  ROS  The following are not active complaints unless bolded sore throat, dysphagia, dental problems, itching, sneezing,  nasal congestion or excess/ purulent secretions, ear ache,   fever, chills, sweats, unintended wt loss, pleuritic or exertional cp, hemoptysis,  orthopnea pnd or leg swelling, presyncope, palpitations, heartburn, abdominal pain, anorexia, nausea, vomiting, diarrhea  or change in bowel or urinary habits, change in stools or urine, dysuria,hematuria,  rash, arthralgias, visual complaints, headache, numbness weakness or ataxia or problems with walking or coordination,  change in mood/affect or memory.           Objective:   Physical Exam  Somber slt hoarse  amb wm   Wt Readings from Last 3 Encounters:  10/22/14 202 lb (91.627 kg)  08/28/14 203 lb 8 oz (92.307 kg)  08/20/14 205 lb 6.4 oz (93.169 kg)    Vital  signs reviewed     HEENT mild turbinate edema.  Oropharynx no thrush or excess pnd or cobblestoning.  No JVD or cervical adenopathy. Mild accessory muscle hypertrophy. Trachea midline, nl thryroid. Chest  CTA bilaterally . Regular rate and rhythm without murmur gallop or rub or increase P2 or edema.  Abd: obese/ soft/ no hsm, nl excursion. Ext warm without cyanosis or clubbing.       CXR PA and Lateral:   05/28/2014 :  COPD. No acute abnormality.   Recent Labs Lab 05/25/14 1711  NA 135  K 4.1  CL 98  CO2 24  BUN 45*  CREATININE 2.62*  GLUCOSE 101*   Lab Results  Component Value Date   WBC 15.2* 04/26/2014   HGB 9.7* 04/26/2014   HCT 29.6* 04/26/2014   MCV 91.1 04/26/2014   PLT 262.0 04/26/2014     Lab Results  Component Value Date   TSH 0.87 12/01/2013     Lab Results  Component Value Date   PROBNP 420.6 04/13/2014         Assessment & Plan:

## 2014-10-22 NOTE — Patient Instructions (Signed)
Please schedule a follow up visit in 3 months but call sooner if needed with pfts and bring all inhalers with you

## 2014-10-24 ENCOUNTER — Ambulatory Visit (HOSPITAL_COMMUNITY)
Admission: RE | Admit: 2014-10-24 | Discharge: 2014-10-24 | Disposition: A | Discharge status: Home / Self Care | Payer: Medicare PPO | Source: Ambulatory Visit | Attending: Urology | Admitting: Urology

## 2014-10-24 DIAGNOSIS — N281 Cyst of kidney, acquired: Secondary | ICD-10-CM | POA: Insufficient documentation

## 2014-10-30 ENCOUNTER — Ambulatory Visit (INDEPENDENT_AMBULATORY_CARE_PROVIDER_SITE_OTHER): Payer: Medicare PPO | Admitting: Nurse Practitioner

## 2014-10-30 ENCOUNTER — Encounter: Payer: Self-pay | Admitting: Nurse Practitioner

## 2014-10-30 VITALS — BP 180/80 | HR 59 | Ht 67.0 in | Wt 203.1 lb

## 2014-10-30 DIAGNOSIS — I10 Essential (primary) hypertension: Secondary | ICD-10-CM | POA: Diagnosis not present

## 2014-10-30 DIAGNOSIS — I2510 Atherosclerotic heart disease of native coronary artery without angina pectoris: Secondary | ICD-10-CM | POA: Diagnosis not present

## 2014-10-30 DIAGNOSIS — E785 Hyperlipidemia, unspecified: Secondary | ICD-10-CM

## 2014-10-30 DIAGNOSIS — I480 Paroxysmal atrial fibrillation: Secondary | ICD-10-CM

## 2014-10-30 LAB — BASIC METABOLIC PANEL
BUN: 53 mg/dL — ABNORMAL HIGH (ref 6–23)
CO2: 24 mEq/L (ref 19–32)
Calcium: 8.8 mg/dL (ref 8.4–10.5)
Chloride: 103 mEq/L (ref 96–112)
Creatinine, Ser: 3.28 mg/dL — ABNORMAL HIGH (ref 0.40–1.50)
GFR: 19.53 mL/min — ABNORMAL LOW (ref >60.00)
Glucose, Bld: 115 mg/dL — ABNORMAL HIGH (ref 70–99)
Potassium: 4.1 mEq/L (ref 3.5–5.1)
Sodium: 135 mEq/L (ref 135–145)

## 2014-10-30 LAB — CBC
HCT: 33.3 % — ABNORMAL LOW (ref 39.0–52.0)
Hemoglobin: 11.3 g/dL — ABNORMAL LOW (ref 13.0–17.0)
MCHC: 34 g/dL (ref 30.0–36.0)
MCV: 86.4 fl (ref 78.0–100.0)
Platelets: 232 10*3/uL (ref 150.0–400.0)
RBC: 3.85 Mil/uL — ABNORMAL LOW (ref 4.22–5.81)
RDW: 14.2 % (ref 11.5–15.5)
WBC: 9 10*3/uL (ref 4.0–10.5)

## 2014-10-30 NOTE — Patient Instructions (Addendum)
We will be checking the following labs today - BMET and CBC   Medication Instructions:    Continue with your current medicines.     Testing/Procedures To Be Arranged:  N/A  Follow-Up:   See Dr. [name] in 4 months.   Have Dr. Ardis Hughs let us know when he wants to proceed with your colonoscopy.    Other Special Instructions:   N/A  Call the [place] office at [phone] if you have any questions, problems or concerns.

## 2014-10-30 NOTE — Progress Notes (Signed)
CARDIOLOGY OFFICE NOTE  Date:  10/30/2014    Sheral Flow Date of Birth: 01-May-1935 Medical Record [id number]  PCP:  [name], DO  Cardiologist:  [facility]    Chief Complaint  Patient presents with  . Atrial Fibrillation    Work in visit - referred by GI - seen for Dr. [name]     History of Present Illness: Terry [name] is a 80 y.o. male who presents today for a work in visit. Seen for Dr. [name]. He has a history of HTN, HLD, CVA, COPD, CKD and CAD. He was previously followed by cardiology in [place], [name] for many years until he moved to [place] in 2014. Cath records received from [facility]. Cardiac cath 11/30/1997. Mild distal Left main stenosis, 25% proximal LAD, minor irregularities Circumflex, 95% mid RCA stenosis treated with 3.5 x 32 mm bare metal stent. There was a ? Overlapping 3.5 x 18 mm bare metal stent more proximally. Echo 02/03/13 with LVEF=50-55%. He had normal carotid dopplers in [place] back 2013. He was admitted to [facility] October 2015 with ruptured kidney cyst.   He was last seen here by Dr. [name] in December - endorsed palpitations and was noted to be in AF. Started on eliquis. Looked like there was a discussion about possible cardioversion. Continues to see Dr. Melvyn Novas - he has wanted to change to bisoprolol but Dr. [name] wanted to continue metoprolol for more beneficial rate control. He cancelled his follow up here.   Saw GI back in March - apparently trying to get his colonoscopy arranged but noted to be on Eliquis and that he had missed his follow up back here.   Comes in today. Here with his wife. Says he is doing ok. Feels better. Breathing better. No chest pain. More energy. Has not had his labs for his Eliquis. Not fasting today.   Past Medical History  Diagnosis Date  . Stroke 2012  . High cholesterol   . Hypertension   . Kidney disease   . COPD (chronic obstructive pulmonary disease)   . CAD (coronary artery disease)  [place], [name]  . Acute and chronic respiratory failure 10/22/2012  . Osteoarthritis 10/22/2012    Past Surgical History  Procedure Laterality Date  . Coronary stent placement  1999    2 stents  . Appendectomy  1984  . Colon surgery  2012    partial colon removed - twisted bowel  . Hernia repair      Right inguinal  . Cystoscopy/retrograde/ureteroscopy Bilateral 04/23/2014    Procedure: CYSTOSCOPY BILATERAL RETROGRADE,  LEFT URETEROSCOPY, LEFT STENT PLACEMENT;  Surgeon: Raynelle Bring, MD;  Location: [facility];  Service: Urology;  Laterality: Bilateral;     Medications: Current Outpatient Prescriptions  Medication Sig Dispense Refill  . amLODipine (NORVASC) 10 MG tablet Take 1 tablet (10 mg total) by mouth daily. 30 tablet 2  . atorvastatin (LIPITOR) 10 MG tablet Take 1 tablet (10 mg total) by mouth daily. 30 tablet 2  . doxazosin (CARDURA) 4 MG tablet Take 1 tablet (4 mg total) by mouth daily. 30 tablet 3  . ELIQUIS 5 MG TABS tablet TAKE ONE TABLET BY MOUTH TWICE DAILY 60 tablet 3  . metoprolol (LOPRESSOR) 100 MG tablet Take 1 tablet (100 mg total) by mouth 2 (two) times daily. 180 tablet 3  . mometasone-formoterol (DULERA) 200-5 MCG/ACT AERO Inhale 2 puffs into the lungs 2 (two) times daily. 1 Inhaler 2  . SPIRIVA  HANDIHALER 18 MCG inhalation capsule INHALE THE CONTENTS OF ONE CAPSULE ONCE DAILY 30 capsule 6  . torsemide (DEMADEX) 20 MG tablet Take 20 mg by mouth daily.      No current facility-administered medications for this visit.    Allergies: Allergies  Allergen Reactions  . Levaquin [Levofloxacin In D5w] Itching  . Yellow Dyes (Non-Tartrazine) Other (See Comments)    Burns arms    Social History: The patient  reports that he quit smoking about 14 years ago. His smoking use included Cigarettes, Pipe, and Cigars. He has a 49 pack-year smoking history. He has never used smokeless tobacco. He reports that he does not drink alcohol or use illicit drugs.   Family  History: The patient's family history includes Cancer in his mother and sister; Heart attack in his father; Heart disease in his father; Parkinson's disease in his brother.   Review of Systems: Please see the history of present illness.   Otherwise, the review of systems is positive for .   All other systems are reviewed and negative.   Physical Exam: VS:  BP 180/80 mmHg  Pulse 59  Ht 5\' 7"  (1.702 m)  Wt 203 lb 1.9 oz (92.135 kg)  BMI 31.81 kg/m2 .  BMI Body mass index is 31.81 kg/(m^2).   BP is 160/70 by me.   Wt Readings from Last 3 Encounters:  10/30/14 203 lb 1.9 oz (92.135 kg)  10/22/14 202 lb (91.627 kg)  08/28/14 203 lb 8 oz (92.307 kg)    General: Alert. He is in no acute distress.  HEENT: Normal. Neck: Supple, no JVD, carotid bruits, or masses noted.  Cardiac: Regular rate and rhythm. No murmurs, rubs, or gallops. No edema.  Respiratory:  Lungs are clear to auscultation bilaterally with normal work of breathing.  GI: Soft and nontender.  MS: No deformity or atrophy. Gait and ROM intact. Skin: Warm and dry. Color is normal.  Neuro:  Strength and sensation are intact and no gross focal deficits noted.  Psych: Alert, appropriate and with normal affect.   LABORATORY DATA:  EKG:  EKG is ordered today. This demonstrates NSR with 1st degree AV block.  Lab Results  Component Value Date   WBC 15.2* 04/26/2014   HGB 9.7* 04/26/2014   HCT 29.6* 04/26/2014   PLT 262.0 04/26/2014   GLUCOSE 101* 05/25/2014   CHOL 172 02/03/2013   TRIG 274.0* 02/03/2013   HDL 32.70* 02/03/2013   LDLDIRECT 93.9 02/03/2013   ALT 20 04/19/2014   AST 18 04/19/2014   NA 135 05/25/2014   K 4.1 05/25/2014   CL 98 05/25/2014   CREATININE 2.62* 05/25/2014   BUN 45* 05/25/2014   CO2 24 05/25/2014   TSH 0.87 12/01/2013   INR 1.05 04/13/2014    BNP (last 3 results) No results for input(s): BNP in the last 8760 hours.  ProBNP (last 3 results)  Recent Labs  04/13/14 0421  PROBNP 420.6       Other Studies Reviewed Today: Echo 02/03/13: Left ventricle: The cavity size was normal. Wall thickness was normal. Systolic function was normal. The estimated ejection fraction was in the range of 50% to 55%. Doppler parameters are consistent with abnormal left ventricular relaxation (grade 1 diastolic dysfunction).  Assessment and Plan:   1. CAD: Stable. No symptoms.   2. HTN: BP with better control at home. He does not wish to increase any of his medicines.   3. Hyperlipidemia: Continue statin. Will repeat lipids and LFTS in one  month  4. Atrial fib: paroxysmal - CHADSVASC is 6 (HTN, CAD, prior stroke & age). Committed to long term anticoagulation. Needs his surveillance labs. No need for cardioversion. Would stay on current regimen for now. If becomes more symptomatic, would consider AAD therapy.   5. Polyps - may proceed at his request with his colonoscopy - currently with no symptoms. Will need to let GI know that his Eliquis will need to be interrupted accordingly.   Current medicines are reviewed with the patient today.  The patient does not have concerns regarding medicines other than what has been noted above.  The following changes have been made:  See above.  Labs/ tests ordered today include:    Orders Placed This Encounter  Procedures  . Basic metabolic panel  . CBC  . EKG 12-Lead     Disposition:   FU with Dr. [name] in 4 months  Patient is agreeable to this plan and will call if any problems develop in the interim.   Signed: Burtis Junes, RN, ANP-C 10/30/2014 3:37 PM  [place] [address] [place], [place]  [address] Phone: [phone] Fax: [phone]

## 2014-11-06 ENCOUNTER — Telehealth: Payer: Self-pay | Admitting: Internal Medicine

## 2014-11-06 NOTE — Telephone Encounter (Signed)
Spoke with pt. He brought a form by here from his insurance company to do a tier exeception for [employer]. Advised him that I will have this filled out and fax in once completed. He would like to be kept in the loop about this. Forms have been placed in MW look at. Will route to [place] to follow up on.

## 2014-11-07 MED ORDER — BUDESONIDE-FORMOTEROL FUMARATE 160-4.5 MCG/ACT IN AERO: 2.0000 | INHALATION_SPRAY | Freq: Two times a day (BID) | RESPIRATORY_TRACT | Status: DC

## 2014-11-07 NOTE — Telephone Encounter (Signed)
Per MW- okay to take symbicort instead 160/4.5 mg 2 puffs bid

## 2014-11-07 NOTE — Telephone Encounter (Signed)
Spoke with pt. He is aware of medication change. Rx has been sent in. Nothing further was needed.

## 2014-11-15 ENCOUNTER — Other Ambulatory Visit: Payer: Self-pay | Admitting: Nurse Practitioner

## 2014-12-19 ENCOUNTER — Other Ambulatory Visit: Payer: Self-pay | Admitting: Cardiovascular Disease

## 2014-12-20 DIAGNOSIS — J449 Chronic obstructive pulmonary disease, unspecified: Secondary | ICD-10-CM | POA: Diagnosis not present

## 2014-12-27 DIAGNOSIS — N184 Chronic kidney disease, stage 4 (severe): Secondary | ICD-10-CM | POA: Diagnosis not present

## 2014-12-31 ENCOUNTER — Telehealth: Payer: Self-pay | Admitting: Internal Medicine

## 2014-12-31 NOTE — Telephone Encounter (Signed)
Form completed and signed by Dr. Melvyn Novas Given to patient Nothing further needed.

## 2015-01-04 ENCOUNTER — Other Ambulatory Visit: Payer: Self-pay

## 2015-01-04 ENCOUNTER — Other Ambulatory Visit: Payer: Self-pay | Admitting: Cardiovascular Disease

## 2015-01-04 MED ORDER — ATORVASTATIN CALCIUM 10 MG PO TABS: 10.0000 mg | ORAL_TABLET | Freq: Every day | ORAL | Status: DC

## 2015-01-10 ENCOUNTER — Telehealth: Payer: Self-pay | Admitting: Internal Medicine

## 2015-01-10 NOTE — Telephone Encounter (Signed)
Went out and spoke with pt. Form was correctly filled out and signed. Nothing further needed

## 2015-01-19 DIAGNOSIS — J449 Chronic obstructive pulmonary disease, unspecified: Secondary | ICD-10-CM | POA: Diagnosis not present

## 2015-01-22 DIAGNOSIS — N184 Chronic kidney disease, stage 4 (severe): Secondary | ICD-10-CM | POA: Diagnosis not present

## 2015-01-31 DIAGNOSIS — I10 Essential (primary) hypertension: Secondary | ICD-10-CM | POA: Diagnosis not present

## 2015-01-31 DIAGNOSIS — N184 Chronic kidney disease, stage 4 (severe): Secondary | ICD-10-CM | POA: Diagnosis not present

## 2015-01-31 DIAGNOSIS — J449 Chronic obstructive pulmonary disease, unspecified: Secondary | ICD-10-CM | POA: Diagnosis not present

## 2015-02-04 ENCOUNTER — Encounter: Payer: Self-pay | Admitting: Family Medicine

## 2015-02-04 DIAGNOSIS — I2510 Atherosclerotic heart disease of native coronary artery without angina pectoris: Secondary | ICD-10-CM | POA: Insufficient documentation

## 2015-02-13 ENCOUNTER — Other Ambulatory Visit: Payer: Self-pay | Admitting: *Deleted

## 2015-02-13 MED ORDER — APIXABAN 5 MG PO TABS: 5.0000 mg | ORAL_TABLET | Freq: Two times a day (BID) | ORAL | Status: DC

## 2015-02-19 DIAGNOSIS — J449 Chronic obstructive pulmonary disease, unspecified: Secondary | ICD-10-CM | POA: Diagnosis not present

## 2015-02-26 ENCOUNTER — Encounter: Payer: Self-pay | Admitting: Internal Medicine

## 2015-02-26 ENCOUNTER — Ambulatory Visit (HOSPITAL_COMMUNITY)
Admission: RE | Admit: 2015-02-26 | Discharge: 2015-02-26 | Disposition: A | Discharge status: Home / Self Care | Payer: Medicare PPO | Source: Ambulatory Visit | Attending: Internal Medicine | Admitting: Internal Medicine

## 2015-02-26 ENCOUNTER — Ambulatory Visit (INDEPENDENT_AMBULATORY_CARE_PROVIDER_SITE_OTHER): Payer: Medicare PPO | Admitting: Internal Medicine

## 2015-02-26 VITALS — BP 116/58 | HR 57 | Ht 70.0 in | Wt 210.0 lb

## 2015-02-26 DIAGNOSIS — Z23 Encounter for immunization: Secondary | ICD-10-CM | POA: Diagnosis not present

## 2015-02-26 DIAGNOSIS — J449 Chronic obstructive pulmonary disease, unspecified: Secondary | ICD-10-CM | POA: Diagnosis not present

## 2015-02-26 DIAGNOSIS — I10 Essential (primary) hypertension: Secondary | ICD-10-CM

## 2015-02-26 DIAGNOSIS — N184 Chronic kidney disease, stage 4 (severe): Secondary | ICD-10-CM | POA: Diagnosis not present

## 2015-02-26 LAB — PULMONARY FUNCTION TEST
DL/VA % PRED: 35 %
DL/VA: 1.55 ml/min/mmHg/L
DLCO UNC % PRED: 24 %
DLCO UNC: 7.19 ml/min/mmHg
FEF 25-75 POST: 0.23 L/s
FEF 25-75 PRE: 0.29 L/s
FEF2575-%CHANGE-POST: -21 %
FEF2575-%PRED-POST: 12 %
FEF2575-%Pred-Pre: 15 %
FEV1-%CHANGE-POST: -11 %
FEV1-%Pred-Post: 37 %
FEV1-%Pred-Pre: 42 %
FEV1-Post: 0.99 L
FEV1-Pre: 1.12 L
FEV1FVC-%CHANGE-POST: -2 %
FEV1FVC-%PRED-PRE: 50 %
FEV6-%CHANGE-POST: -7 %
FEV6-%PRED-POST: 62 %
FEV6-%Pred-Pre: 67 %
FEV6-PRE: 2.33 L
FEV6-Post: 2.15 L
FEV6FVC-%CHANGE-POST: 0 %
FEV6FVC-%Pred-Post: 81 %
FEV6FVC-%Pred-Pre: 82 %
FVC-%CHANGE-POST: -8 %
FVC-%PRED-POST: 76 %
FVC-%Pred-Pre: 83 %
FVC-Post: 2.83 L
FVC-Pre: 3.09 L
POST FEV1/FVC RATIO: 35 %
Post FEV6/FVC ratio: 76 %
Pre FEV1/FVC ratio: 36 %
Pre FEV6/FVC Ratio: 76 %
RV % pred: 149 %
RV: 3.7 L
TLC % pred: 100 %
TLC: 6.57 L

## 2015-02-26 MED ORDER — TIOTROPIUM BROMIDE MONOHYDRATE 2.5 MCG/ACT IN AERS: INHALATION_SPRAY | RESPIRATORY_TRACT | Status: DC

## 2015-02-26 MED ORDER — ALBUTEROL SULFATE HFA 108 (90 BASE) MCG/ACT IN AERS: 2.0000 | INHALATION_SPRAY | RESPIRATORY_TRACT | Status: DC | PRN

## 2015-02-26 MED ORDER — ALBUTEROL SULFATE (2.5 MG/3ML) 0.083% IN NEBU
2.5000 mg | INHALATION_SOLUTION | Freq: Once | RESPIRATORY_TRACT | Status: AC
  Administered 2015-02-26: 2.5 mg via RESPIRATORY_TRACT

## 2015-02-26 NOTE — Progress Notes (Signed)
Subjective:    Patient ID: Terry [name], male    DOB: October 14, 1934  MRN: [id number]   Brief patient profile:  35 yowm quit smoking 2001 bothered by doe seemed better then worse in 2010 p "lung infection" and stayed on advair/spiriva  [place] since 2012 seemed better then admitted in [facility] with pna 10/09/12  in then transferred to [facility] x 3 weeks and discharged  On May 1st  2014 referred to pulmonary clinic 11/29/12  by [facility] for copd eval and proved to have GOLD III copd 01/26/13 with symptoms much better off ACEi.   HPI 11/29/2012 1st pulmonary ov/ Chalee Hirota/ on ACEi/ cc doe x maybe a block of walking but not back to baseline and able to do 5 steps and has proaire but not needing. Also persistent dry daytime cough x months >d/c ACE    Admission date: 04/12/2014 Discharge Date: 04/20/2014    Admission Diagnosis Chronic kidney disease, stage III (moderate) [N18.3] Hyperkalemia [E87.5] Left flank pain [R10.9] Cyst of left kidney [Q61.00] COPD mixed type [J44.9] Essential hypertension [I10] Abdominal pain [R10.9]   Discharge Diagnosis Chronic kidney disease, stage III (moderate) [N18.3] Hyperkalemia [E87.5] Left flank pain [R10.9] Cyst of left kidney [Q61.00] COPD mixed type [J44.9] Essential hypertension [I10] Abdominal pain [R10.9]   Principal Problem:  Left flank pain  COPD GOLD III  Chronic kidney disease, stage III (moderate)  CAD (coronary artery disease) of artery bypass graft  HTN (hypertension)  Hyperkalemia   Hospital Course  1. Left flank pain - due to hemorrhage and a renal cyst repeat CT confirms, seen by urologist D/W Dr Alinda Money on 04-14-14, likely cystoscopy outpatient, will continue to follow closely his symptoms and renal function. CT showed some perinephric stranding on the left side, had ++ leukocytosis resolved on empiric IV Rocephin will switch to 4 more days of PO Ceftin on 04-20-14, follow with PCP, renal and urology. 2. Acute renal  failure on top of chronic disease stage IV. Baseline creatinine around 2. Also had some bladder outlet obstruction. Foley was placed, he was hydrated, ACE inhibitor and Lasix held. Renal function has steadily improved. Appreciate nephrology input. We will follow post discharge with nephrology and PCP. 3. Hyponatremia - Per renal. Improving with self diuresis. Repeat BMP in 3-4 days. 4. Hypertension. Stable on beta blocker and Norvasc continue. ACE and Lasix discontinued due to acute renal failure. 5. CAD. No acute issue. Continue beta blocker, hold aspirin for now.  6.Constipation. Placed on bowel regimen and monitor.  7. Shortness of breath. Likely due to combination of mild fluid overload and questionable COPD exacerbation, much improved. The fluids were held and he is being self diuresing, also after a trial of IV steroids. Still requiring 2 L of nasal cannula oxygen down to 5. Much improved after pulmonary toiletry, he be discharged with nebulizer treatments on a steroid taper and home oxygen. Steadily improving with ongoing self diuresis   05/28/2014 transition of care/ post hosp f/u ov/Lamyah Creed re:  Chief Complaint  Patient presents with  . HFU    Breathing has improved some, but not back to his normal  baseline. He gets SOB walking minimal distances.    still on advair/ saba twice daily helps some - no symptoms at rest or sleeping  rec Try symbicort 160  Take 2 puffs first thing in am and then another 2 puffs about 12 hours later.  Only use your albuterol as needed   08/20/2014 NP  Follow up COPD  Patient  returns for a 6 week follow-up. Patient reports that he has good days and bad. Patient was previously on [facility] after his last hospitalization and would like to try this inhaler instead of Advair He feels that he had improved control of his breathing on this He denies any chest pain, orthopnea, PND, increased leg swelling or hemoptysis. Chest x-ray last visit with COPD changes. No acute  process noted. rec May try Dulera 200 2 puffs Twice daily  , rinse after use.  Continue on Spiriva daily .    10/22/2014 f/u ov/Norwood Quezada re: GOLD III on dulera 200 2 bid and spiriva/ no need for saba / not using med calendar  Chief Complaint  Patient presents with  . Follow-up    Pt states that his breathing has improved since the last visit.   doe improves and now able to walk mailbox and back s stopping  = MMRC1  rec Please schedule a follow up visit in 3 months but call sooner if needed with pfts and bring all inhalers with you     02/26/2015 f/u ov/Shamel Galyean re:  GOLD III/ no 02 on symb/ spiriva   Chief Complaint  Patient presents with  . Follow-up    Pt states that his breathing is unchanged. He is having increased cough x 3 wks- prod with white sputum.     No need for rescue rx Doe x MMRC1 = can walk nl pace, flat grade, can't hurry or go uphills or steps s sob    No obvious day to day or daytime variabilty or assoc  cp or chest tightness, subjective wheeze overt sinus or hb symptoms. No unusual exp hx or h/o childhood pna/ asthma or knowledge of premature birth.   Sleeping ok without nocturnal  or early am exacerbation  of respiratory  c/o's or need for noct saba. Also denies any obvious fluctuation of symptoms with weather or environmental changes or other aggravating or alleviating factors except as outlined above   Current Medications, Allergies, Complete Past Medical History, Past Surgical History, Family History, and Social History were reviewed in [employer] record.  ROS  The following are not active complaints unless bolded sore throat, dysphagia, dental problems, itching, sneezing,  nasal congestion or excess/ purulent secretions, ear ache,   fever, chills, sweats, unintended wt loss, pleuritic or exertional cp, hemoptysis,  orthopnea pnd or leg swelling, presyncope, palpitations, heartburn, abdominal pain, anorexia, nausea, vomiting, diarrhea  or change in  bowel or urinary habits, change in stools or urine, dysuria,hematuria,  rash, arthralgias, visual complaints, headache, numbness weakness or ataxia or problems with walking or coordination,  change in mood/affect or memory.           Objective:   Physical Exam  Somber slt hoarse  amb wm   02/26/2015          210  Wt Readings from Last 3 Encounters:  10/22/14 202 lb (91.627 kg)  08/28/14 203 lb 8 oz (92.307 kg)  08/20/14 205 lb 6.4 oz (93.169 kg)    Vital signs reviewed    HEENT mild turbinate edema.  Oropharynx no thrush or excess pnd or cobblestoning.  No JVD or cervical adenopathy. Mild accessory muscle hypertrophy. Trachea midline, nl thryroid. Chest  CTA bilaterally . Regular rate and rhythm without murmur gallop or rub or increase P2 or edema.  Abd: obese/ soft/ no hsm, nl excursion. Ext warm without cyanosis or clubbing.     No recent cxr's on file  Assessment & Plan:

## 2015-02-26 NOTE — Patient Instructions (Addendum)
No change in medications - your weight and kidney function are clearly an issue and I will let Dr Joelyn Oms know about the HCO3 issue   Let the symbicort and spiriva go out thru your nose   Only use your albuterol (ventolin) as a rescue medication to be used if you can't catch your breath by resting or doing a relaxed purse lip breathing pattern.  - The less you use it, the better it will work when you need it. - Ok to use up to 2 puffs  every 4 hours if you must but call for immediate appointment if use goes up over your usual need - Don't leave home without it !!  (think of it like the spare tire for your car)   Please schedule a follow up visit in 3 months but call sooner if needed bring all inhalers with you and fill the spiriva respimat before the visit - late add: ? Bisoprolol instead of lopressor

## 2015-02-27 ENCOUNTER — Encounter: Payer: Self-pay | Admitting: Internal Medicine

## 2015-02-27 NOTE — Assessment & Plan Note (Signed)
I would have a lower threshold to add hco03 in this pt in event hco3 slips any lower than 24 due to need for buffering capacity for C02

## 2015-02-27 NOTE — Assessment & Plan Note (Addendum)
-   Trial off ACEi 11/30/2012 >>> much better symptom control - 05/28/2014   Walked RA x one lap @ 185 stopped due to  [employer] / sats ok/ mod pace - PFT's 01/26/13      FEV1  1.28 (48%) ratio 48 and no better p B2,  DLCO 39 corrects to 48% - 10/22/2014 p extensive coaching HFA effectiveness =    90%  - PFTs 02/26/2015   FEV1 0.99  (35 % ) ratio 35  p 11 % improvement from saba with DLCO  24 % corrects to 35  % for alv volume  p am symb/spiriva     He has severe copd/ emphysematous phenotype but relatively well presereve ext to with no desats on rx with symbicort and spriva and no recent tendency to aecopd  He does have intermittent cough and some dry mouth complaints that are prob related to spiriva dpi and I would like to try him on the respimat at next ov if his insurance company has it on same tier.   I had an extended discussion with the patient reviewing all relevant studies completed to date and  lasting 15 to 20 minutes of a 25 minute visit    Each maintenance medication was reviewed in detail including most importantly the difference between maintenance and prns and under what circumstances the prns are to be triggered using an action plan format that is not reflected in the computer generated alphabetically organized AVS.    Please see instructions for details which were reviewed in writing and the patient given a copy highlighting the part that I personally wrote and discussed at today's ov.

## 2015-02-27 NOTE — Assessment & Plan Note (Signed)
Strongly prefer in this setting: Bystolic, the most beta -1  selective Beta blocker available in sample form, with bisoprolol the most selective generic choice  on the market.  High doses of lopressor are contraindicated in pts with any asthmatic component esp in severe copd  and if this becomes an issue he needs trial of bisoprolol

## 2015-03-01 ENCOUNTER — Ambulatory Visit (INDEPENDENT_AMBULATORY_CARE_PROVIDER_SITE_OTHER): Payer: Medicare PPO | Admitting: Cardiovascular Disease

## 2015-03-01 ENCOUNTER — Encounter: Payer: Self-pay | Admitting: Cardiovascular Disease

## 2015-03-01 VITALS — BP 140/58 | HR 63 | Ht 70.0 in | Wt 208.3 lb

## 2015-03-01 DIAGNOSIS — I480 Paroxysmal atrial fibrillation: Secondary | ICD-10-CM | POA: Diagnosis not present

## 2015-03-01 DIAGNOSIS — I10 Essential (primary) hypertension: Secondary | ICD-10-CM

## 2015-03-01 DIAGNOSIS — I25110 Atherosclerotic heart disease of native coronary artery with unstable angina pectoris: Secondary | ICD-10-CM

## 2015-03-01 DIAGNOSIS — E785 Hyperlipidemia, unspecified: Secondary | ICD-10-CM | POA: Diagnosis not present

## 2015-03-01 NOTE — Progress Notes (Signed)
Chief Complaint  Patient presents with  . Cough      History of Present Illness: 80 yo male with history of HTN, HLD, CVA, COPD, CAD, chronic kidney disease here today for cardiac follow up. He had been followed by cardiology in [place], [name] for many years until he moved to [place] in 2014. Cath records received from [facility] . Cardiac cath 11/30/1997. Mild distal Left main stenosis, 25% proximal LAD, minor irregularities Circumflex, 95% mid RCA stenosis treated with 3.5 x 32 mm bare metal stent. There was a ? Overlapping 3.5 x 18 mm bare metal stent more proximally. Echo 02/03/13 with LVEF=50-55%. He had normal carotid dopplers in [place] 2013. He was admitted to [facility] October 2015 with ruptured kidney cyst. Atrial fibrillation diagnosed in December 2015 at an office visit. He was started on Eliquis at that time. He is followed in Nephrology for stage 4 CKD ([facility]). He has had no stress testing in the last several years.   He is here today for follow up. No chest pain. He is having more dyspnea. Weight is stable. Lower extremity edema is stable. His renal function is worsening and he may need HD soon. He is being evaluated for fistula placement.   Primary Care Physician: Dr. Colin Benton  Lipid Panel     Component Value Date/Time   CHOL 172 02/03/2013 0933   TRIG 274.0* 02/03/2013 0933   HDL 32.70* 02/03/2013 0933   CHOLHDL 5 02/03/2013 0933   VLDL 54.8* 02/03/2013 0933       LDL: 93  Past Medical History  Diagnosis Date  . Stroke 2012  . High cholesterol   . Hypertension   . Kidney disease     CKD4, sees [place] [facility], considering dialysis  . COPD (chronic obstructive pulmonary disease)   . CAD (coronary artery disease) [place], [name]  . Acute and chronic respiratory failure 10/22/2012  . Osteoarthritis 10/22/2012    Past Surgical History  Procedure Laterality Date  . Coronary stent placement  1999    2 stents  . Appendectomy  1984  . Colon  surgery  2012    partial colon removed - twisted bowel  . Hernia repair      Right inguinal  . Cystoscopy/retrograde/ureteroscopy Bilateral 04/23/2014    Procedure: CYSTOSCOPY BILATERAL RETROGRADE,  LEFT URETEROSCOPY, LEFT STENT PLACEMENT;  Surgeon: Raynelle Bring, MD;  Location: [facility];  Service: Urology;  Laterality: Bilateral;    Current Outpatient Prescriptions  Medication Sig Dispense Refill  . albuterol (VENTOLIN HFA) 108 (90 BASE) MCG/ACT inhaler Inhale 2 puffs into the lungs every 4 (four) hours as needed for wheezing or shortness of breath. 1 Inhaler 1  . amLODipine (NORVASC) 10 MG tablet TAKE ONE TABLET BY MOUTH ONCE DAILY 30 tablet 3  . apixaban (ELIQUIS) 5 MG TABS tablet Take 1 tablet (5 mg total) by mouth 2 (two) times daily. 60 tablet 1  . atorvastatin (LIPITOR) 10 MG tablet Take 1 tablet (10 mg total) by mouth daily. 30 tablet 2  . budesonide-formoterol (SYMBICORT) 160-4.5 MCG/ACT inhaler Inhale 2 puffs into the lungs 2 (two) times daily. 1 Inhaler 6  . doxazosin (CARDURA) 4 MG tablet TAKE ONE TABLET BY MOUTH ONCE DAILY 30 tablet 5  . metoprolol (LOPRESSOR) 100 MG tablet Take 1 tablet (100 mg total) by mouth 2 (two) times daily. 180 tablet 3  . SPIRIVA HANDIHALER 18 MCG inhalation capsule INHALE THE CONTENTS OF ONE CAPSULE ONCE DAILY 30 capsule  6  . Tiotropium Bromide Monohydrate (SPIRIVA RESPIMAT) 2.5 MCG/ACT AERS Take 2 puffs first thing in am and then another 2 puffs about 12 hours later. (Patient taking differently: Inhale into the lungs. Take 2 puffs first thing in am and then another 2 puffs about 12 hours later.) 1 Inhaler 11  . torsemide (DEMADEX) 20 MG tablet Take 20 mg by mouth daily.      No current facility-administered medications for this visit.    Allergies  Allergen Reactions  . Levaquin [Levofloxacin In D5w] Itching  . Yellow Dyes (Non-Tartrazine) Other (See Comments)    Burns arms    Social History   Social History  . Marital Status: Married    Spouse  Name: N/A  . Number of Children: 3  . Years of Education: N/A   Occupational History  . retired-worked in radio    Social History Main Topics  . Smoking status: Former Smoker -- 1.00 packs/day for 49 years    Types: Cigarettes, Pipe, Cigars    Quit date: 02/21/2000  . Smokeless tobacco: Never Used  . Alcohol Use: No  . Drug Use: No  . Sexual Activity: Not on file   Other Topics Concern  . Not on file   Social History Narrative   Work or School: retired [place] Situation: lives with wife in [place] Beliefs: [place] regular; poor diet             Family History  Problem Relation Age of Onset  . Cancer Mother   . Heart disease Father   . Heart attack Father   . Cancer Sister   . Parkinson's disease Brother     Review of Systems:  As stated in the HPI and otherwise negative.   BP 140/58 mmHg  Pulse 63  Ht 5\' 10"  (1.778 m)  Wt 208 lb 4.8 oz (94.484 kg)  BMI 29.89 kg/m2  SpO2 94%  Physical Examination: General: Well developed, well nourished, NAD HEENT: OP clear, mucus membranes moist SKIN: warm, dry. No rashes. Neuro: No focal deficits Musculoskeletal: Muscle strength 5/5 all ext Psychiatric: Mood and affect normal Neck: No JVD, no carotid bruits, no thyromegaly, no lymphadenopathy. Lungs:Clear bilaterally, no wheezes, rhonci, crackles Cardiovascular: Irreg irreg rate and rhythm. No murmurs, gallops or rubs. Abdomen:Soft. Bowel sounds present. Non-tender.  Extremities: Trace bilateral lower extremity edema. Pulses are 2 + in the bilateral DP/PT.  Echo 02/03/13: Left ventricle: The cavity size was normal. Wall thickness was normal. Systolic function was normal. The estimated ejection fraction was in the range of 50% to 55%. Doppler parameters are consistent with abnormal left ventricular relaxation (grade 1 diastolic dysfunction).  EKG:  EKG is not ordered today. The ekg ordered today demonstrates     Recent Labs: 04/13/2014: Pro B Natriuretic peptide (BNP) 420.6 04/19/2014: ALT 20 10/30/2014: BUN 53*; Creatinine, Ser 3.28*; Hemoglobin 11.3*; Platelets 232.0; Potassium 4.1; Sodium 135   Lipid Panel    Component Value Date/Time   CHOL 172 02/03/2013 0933   TRIG 274.0* 02/03/2013 0933   HDL 32.70* 02/03/2013 0933   CHOLHDL 5 02/03/2013 0933   VLDL 54.8* 02/03/2013 0933   LDLDIRECT 93.9 02/03/2013 0933     Wt Readings from Last 3 Encounters:  03/01/15 208 lb 4.8 oz (94.484 kg)  02/26/15 210 lb (95.255 kg)  10/30/14 203 lb 1.9 oz (92.135 kg)     Other studies Reviewed:  Additional studies/ records that were reviewed today include: . Review of the above records demonstrates:    Assessment and Plan:   1. CAD: Continue beta blocker, statin. He has had no recent chest pain but his dyspnea seems to be worsened. He will need an AV fistula in preparation for dialysis. Will arrange Lexiscan stress myoview to exclude ischemia since he has dyspnea and no recent stress testing.   2. HTN: BP well controlled. No changes.   3. Hyperlipidemia: Continue statin. Will repeat lipids and LFTS on the day of his stress test.   4. Atrial fib: Seems to be in sinus today. Prefer to continue to use Lopressor since it is working well for his atrial fibrillation. CHADS VASC Score of 6. Will need long term anti-coagulation. Will continue Eliquis 5 mg po BID.   Current medicines are reviewed at length with the patient today.  The patient does not have concerns regarding medicines.  The following changes have been made:  no change  Labs/ tests ordered today include:   Orders Placed This Encounter  Procedures  . Lipid Profile  . Hepatic function panel  . Myocardial Perfusion Imaging    Disposition:   FU with me in 6 months  Signed, Lauree Chandler, MD 03/01/2015 5:25 PM    [facility] [place], [place], [place]  [address] Phone: [phone]; Fax: [phone]

## 2015-03-01 NOTE — Patient Instructions (Addendum)
Medication Instructions:  Your physician recommends that you continue on your current medications as directed. Please refer to the Current Medication list given to you today.   Labwork: Your physician recommends that you return for fasting lab work on day of lexiscan--Lipid and Liver profiles   Testing/Procedures: Your physician has requested that you have a lexiscan myoview. For further information please visit [email or web address]. Please follow instruction sheet, as given.    Follow-Up: Your physician wants you to follow-up in: 6 months.  You will receive a reminder letter in the mail two months in advance. If you don't receive a letter, please call our office to schedule the follow-up appointment.   Any Other Special Instructions Will Be Listed Below (If Applicable).

## 2015-03-12 ENCOUNTER — Encounter (HOSPITAL_COMMUNITY): Payer: Medicare PPO

## 2015-03-12 ENCOUNTER — Other Ambulatory Visit: Payer: Medicare PPO

## 2015-03-12 DIAGNOSIS — I10 Essential (primary) hypertension: Secondary | ICD-10-CM | POA: Diagnosis not present

## 2015-03-12 DIAGNOSIS — N184 Chronic kidney disease, stage 4 (severe): Secondary | ICD-10-CM | POA: Diagnosis not present

## 2015-03-15 ENCOUNTER — Other Ambulatory Visit: Payer: Medicare PPO

## 2015-03-15 ENCOUNTER — Encounter (HOSPITAL_COMMUNITY): Payer: Medicare PPO

## 2015-03-18 ENCOUNTER — Telehealth (HOSPITAL_COMMUNITY): Payer: Self-pay | Admitting: *Deleted

## 2015-03-18 NOTE — Telephone Encounter (Signed)
Patient given detailed instructions per Myocardial Perfusion Study Information Sheet for test on 03/20/15 at 0930 . Patient notified to arrive 15 minutes early and that it is imperative to arrive on time for appointment to keep from having the test rescheduled.  If you need to cancel or reschedule your appointment, please call the office within 24 hours of your appointment. Failure to do so may result in a cancellation of your appointment, and a $50 no show fee. Patient verbalized understanding. Hasspacher, Ranae Palms

## 2015-03-19 ENCOUNTER — Other Ambulatory Visit: Payer: Self-pay

## 2015-03-19 DIAGNOSIS — Z01810 Encounter for preprocedural cardiovascular examination: Secondary | ICD-10-CM

## 2015-03-19 DIAGNOSIS — N184 Chronic kidney disease, stage 4 (severe): Secondary | ICD-10-CM

## 2015-03-20 ENCOUNTER — Ambulatory Visit (HOSPITAL_COMMUNITY): Payer: Medicare PPO | Attending: Cardiovascular Disease

## 2015-03-20 ENCOUNTER — Other Ambulatory Visit (INDEPENDENT_AMBULATORY_CARE_PROVIDER_SITE_OTHER): Payer: Medicare PPO | Admitting: *Deleted

## 2015-03-20 DIAGNOSIS — E785 Hyperlipidemia, unspecified: Secondary | ICD-10-CM

## 2015-03-20 DIAGNOSIS — R9439 Abnormal result of other cardiovascular function study: Secondary | ICD-10-CM | POA: Insufficient documentation

## 2015-03-20 DIAGNOSIS — I10 Essential (primary) hypertension: Secondary | ICD-10-CM | POA: Insufficient documentation

## 2015-03-20 DIAGNOSIS — R0609 Other forms of dyspnea: Secondary | ICD-10-CM | POA: Diagnosis not present

## 2015-03-20 DIAGNOSIS — I25110 Atherosclerotic heart disease of native coronary artery with unstable angina pectoris: Secondary | ICD-10-CM | POA: Insufficient documentation

## 2015-03-20 LAB — HEPATIC FUNCTION PANEL
ALT: 13 U/L (ref 0–53)
AST: 14 U/L (ref 0–37)
Albumin: 3.8 g/dL (ref 3.5–5.2)
Alkaline Phosphatase: 55 U/L (ref 39–117)
BILIRUBIN TOTAL: 0.3 mg/dL (ref 0.2–1.2)
Bilirubin, Direct: 0.1 mg/dL (ref 0.0–0.3)
Total Protein: 6.8 g/dL (ref 6.0–8.3)

## 2015-03-20 LAB — LIPID PANEL
Cholesterol: 183 mg/dL (ref 0–200)
HDL: 34.4 mg/dL — ABNORMAL LOW (ref >39.00)
LDL CALC: 109 mg/dL — AB (ref 0–99)
NONHDL: 148.25
Total CHOL/HDL Ratio: 5
Triglycerides: 194 mg/dL — ABNORMAL HIGH (ref 0.0–149.0)
VLDL: 38.8 mg/dL (ref 0.0–40.0)

## 2015-03-20 MED ORDER — REGADENOSON 0.4 MG/5ML IV SOLN
0.4000 mg | Freq: Once | INTRAVENOUS | Status: AC
  Administered 2015-03-20: 0.4 mg via INTRAVENOUS

## 2015-03-20 MED ORDER — TECHNETIUM TC 99M SESTAMIBI GENERIC - CARDIOLITE
33.0000 | Freq: Once | INTRAVENOUS | Status: AC | PRN
  Administered 2015-03-20: 33 via INTRAVENOUS

## 2015-03-20 MED ORDER — TECHNETIUM TC 99M SESTAMIBI GENERIC - CARDIOLITE
10.9000 | Freq: Once | INTRAVENOUS | Status: AC | PRN
  Administered 2015-03-20: 10.9 via INTRAVENOUS

## 2015-03-22 ENCOUNTER — Telehealth: Payer: Self-pay | Admitting: Cardiovascular Disease

## 2015-03-22 DIAGNOSIS — J449 Chronic obstructive pulmonary disease, unspecified: Secondary | ICD-10-CM | POA: Diagnosis not present

## 2015-03-22 NOTE — Telephone Encounter (Signed)
Stress test was not routed to my inbox. Results reviewed today. There is a perfusion defect but scan felt to be low risk. I left him a message on voicemail. Will need to review with him next week. Could consider cath but given his renal insufficiency, would be high risk for contrast nephropathy and progression to dialysis.   Darlina Guys

## 2015-03-24 ENCOUNTER — Other Ambulatory Visit: Payer: Self-pay | Admitting: Internal Medicine

## 2015-03-25 ENCOUNTER — Other Ambulatory Visit: Payer: Self-pay | Admitting: *Deleted

## 2015-03-25 MED ORDER — ATORVASTATIN CALCIUM 20 MG PO TABS: 20.0000 mg | ORAL_TABLET | Freq: Every day | ORAL | Status: DC

## 2015-03-25 NOTE — Telephone Encounter (Signed)
I spoke to Terry [name] today. Will hold off on cardiac cath right now given his renal insufficiency. He will call if he has any chest pain or worsened SOB.

## 2015-03-27 DIAGNOSIS — I120 Hypertensive chronic kidney disease with stage 5 chronic kidney disease or end stage renal disease: Secondary | ICD-10-CM | POA: Diagnosis not present

## 2015-03-27 DIAGNOSIS — Z7901 Long term (current) use of anticoagulants: Secondary | ICD-10-CM | POA: Diagnosis not present

## 2015-03-27 DIAGNOSIS — J449 Chronic obstructive pulmonary disease, unspecified: Secondary | ICD-10-CM | POA: Diagnosis not present

## 2015-03-27 DIAGNOSIS — J410 Simple chronic bronchitis: Secondary | ICD-10-CM | POA: Diagnosis not present

## 2015-03-27 DIAGNOSIS — Z6831 Body mass index (BMI) 31.0-31.9, adult: Secondary | ICD-10-CM | POA: Diagnosis not present

## 2015-03-27 DIAGNOSIS — I4891 Unspecified atrial fibrillation: Secondary | ICD-10-CM | POA: Diagnosis not present

## 2015-03-27 DIAGNOSIS — N185 Chronic kidney disease, stage 5: Secondary | ICD-10-CM | POA: Diagnosis not present

## 2015-03-27 DIAGNOSIS — E785 Hyperlipidemia, unspecified: Secondary | ICD-10-CM | POA: Diagnosis not present

## 2015-03-27 DIAGNOSIS — Z87891 Personal history of nicotine dependence: Secondary | ICD-10-CM | POA: Diagnosis not present

## 2015-04-05 ENCOUNTER — Other Ambulatory Visit: Payer: Self-pay | Admitting: *Deleted

## 2015-04-05 DIAGNOSIS — E785 Hyperlipidemia, unspecified: Secondary | ICD-10-CM

## 2015-04-09 LAB — MYOCARDIAL PERFUSION IMAGING
CHL CUP NUCLEAR SDS: 6
CHL CUP RESTING HR STRESS: 67 {beats}/min
LV sys vol: 45 mL
LVDIAVOL: 123 mL
NUC STRESS TID: 0.94
Peak HR: 83 {beats}/min
RATE: 0.41
SRS: 4
SSS: 10

## 2015-04-13 ENCOUNTER — Other Ambulatory Visit: Payer: Self-pay | Admitting: Cardiovascular Disease

## 2015-04-16 ENCOUNTER — Other Ambulatory Visit: Payer: Self-pay | Admitting: Cardiovascular Disease

## 2015-04-17 ENCOUNTER — Telehealth: Payer: Self-pay | Admitting: *Deleted

## 2015-04-17 NOTE — Telephone Encounter (Signed)
called asking if we sent in refill, confirmed that we did (eliquis)

## 2015-04-18 ENCOUNTER — Encounter (HOSPITAL_COMMUNITY): Payer: Medicare PPO

## 2015-04-18 ENCOUNTER — Ambulatory Visit: Payer: Medicare PPO | Admitting: Vascular Surgery

## 2015-04-18 ENCOUNTER — Other Ambulatory Visit (HOSPITAL_COMMUNITY): Payer: Medicare PPO

## 2015-04-21 DIAGNOSIS — J449 Chronic obstructive pulmonary disease, unspecified: Secondary | ICD-10-CM | POA: Diagnosis not present

## 2015-04-24 ENCOUNTER — Encounter (HOSPITAL_COMMUNITY): Payer: Medicare PPO

## 2015-04-24 ENCOUNTER — Other Ambulatory Visit (HOSPITAL_COMMUNITY): Payer: Medicare PPO

## 2015-04-24 ENCOUNTER — Ambulatory Visit: Payer: Medicare PPO | Admitting: Vascular Surgery

## 2015-04-25 ENCOUNTER — Other Ambulatory Visit: Payer: Self-pay | Admitting: Internal Medicine

## 2015-04-25 ENCOUNTER — Telehealth: Payer: Self-pay | Admitting: Internal Medicine

## 2015-04-25 MED ORDER — TIOTROPIUM BROMIDE MONOHYDRATE 18 MCG IN CAPS: ORAL_CAPSULE | RESPIRATORY_TRACT | Status: DC

## 2015-04-25 NOTE — Telephone Encounter (Signed)
Fine with me but be on the lookout for dry mouth, cough and urinary issues much more common with the [facility] than the respimat

## 2015-04-25 NOTE — Telephone Encounter (Signed)
Called made pt aware of below. RX for spiriva [facility] sent in. Nothing further needed

## 2015-04-25 NOTE — Telephone Encounter (Signed)
Spoke with pt. He reports he is not wanting to try the spiriva respimat. He wants to stay on the spiriva [facility]. He feels more comfortable using this inhaler. Please advise MW thanks

## 2015-04-29 ENCOUNTER — Encounter: Payer: Self-pay | Admitting: Vascular Surgery

## 2015-05-01 ENCOUNTER — Encounter: Payer: Self-pay | Admitting: Vascular Surgery

## 2015-05-01 ENCOUNTER — Ambulatory Visit (HOSPITAL_COMMUNITY)
Admission: RE | Admit: 2015-05-01 | Discharge: 2015-05-01 | Disposition: A | Discharge status: Home / Self Care | Payer: Medicare PPO | Source: Ambulatory Visit | Attending: Vascular Surgery | Admitting: Vascular Surgery

## 2015-05-01 ENCOUNTER — Ambulatory Visit (INDEPENDENT_AMBULATORY_CARE_PROVIDER_SITE_OTHER): Payer: Medicare PPO | Admitting: Vascular Surgery

## 2015-05-01 ENCOUNTER — Ambulatory Visit (INDEPENDENT_AMBULATORY_CARE_PROVIDER_SITE_OTHER)
Admission: RE | Admit: 2015-05-01 | Discharge: 2015-05-01 | Disposition: A | Discharge status: Home / Self Care | Payer: Medicare PPO | Source: Ambulatory Visit | Attending: Vascular Surgery | Admitting: Vascular Surgery

## 2015-05-01 VITALS — BP 163/62 | HR 63 | Temp 97.9°F | Resp 16 | Ht 68.0 in | Wt 207.0 lb

## 2015-05-01 DIAGNOSIS — N184 Chronic kidney disease, stage 4 (severe): Secondary | ICD-10-CM

## 2015-05-01 DIAGNOSIS — Z01810 Encounter for preprocedural cardiovascular examination: Secondary | ICD-10-CM | POA: Diagnosis not present

## 2015-05-01 NOTE — Progress Notes (Signed)
[facility] [place]  Patient name: Terry [name] MRN: [id number] DOB: 08/28/1934 Sex: male  REASON FOR CONSULT: Evaluate for hemodialysis access. Referred by Dr. Joelyn Oms.   HPI: Terry [name] is a 80 y.o. male, who is is referred for evaluation for hemodialysis access. He is left-handed. He is not sure of the etiology of his chronic kidney disease. He denies any recent uremic symptoms. Specifically, he denies nausea, vomiting, fatigue, anorexia, or palpitations.  He has a history of atrial fibrillation and is on Eliquis for this. He does have a history of significant COPD. He's also had a TIA in the past associated with some speech issues and has no residual deficit. He had some mild left upper extremity weakness which completely resolved.  I have reviewed the records from [facility]. The patient has stage IV chronic kidney disease and hypertension. He has a history of COPD and is therefore a poor candidate for peritoneal dialysis. His blood pressure has been stable. He does have a history of a previous CVA with residual deficit.  Past Medical History  Diagnosis Date  . Stroke ([place]) 2012  . High cholesterol   . Hypertension   . Kidney disease     CKD4, sees [place] [facility], considering dialysis  . COPD (chronic obstructive pulmonary disease) ([place])   . CAD (coronary artery disease) [place], [name]  . Acute and chronic respiratory failure 10/22/2012  . Osteoarthritis 10/22/2012  . Pneumonia April 2014  . Atrial fibrillation ([facility])     Family History  Problem Relation Age of Onset  . Cancer Mother   . Heart disease Father   . Heart attack Father   . Cancer Sister   . Parkinson's disease Brother     SOCIAL HISTORY: Social History   Social History  . Marital Status: Married    Spouse Name: N/A  . Number of Children: 3  . Years of Education: N/A   Occupational History  . retired-worked in radio    Social History Main Topics  .  Smoking status: Former Smoker -- 1.00 packs/day for 49 years    Types: Cigarettes, Pipe, Cigars    Quit date: 02/21/2000  . Smokeless tobacco: Never Used  . Alcohol Use: No  . Drug Use: No  . Sexual Activity: Not on file   Other Topics Concern  . Not on file   Social History Narrative   Work or School: retired [place] Situation: lives with wife in [place] Beliefs: [place] regular; poor diet             Allergies  Allergen Reactions  . Levaquin [Levofloxacin In D5w] Itching  . Yellow Dyes (Non-Tartrazine) Other (See Comments)    Burns arms    Current Outpatient Prescriptions  Medication Sig Dispense Refill  . amLODipine (NORVASC) 10 MG tablet TAKE ONE TABLET BY MOUTH ONCE DAILY 30 tablet 10  . atorvastatin (LIPITOR) 20 MG tablet Take 1 tablet (20 mg total) by mouth daily. 30 tablet 6  . budesonide-formoterol (SYMBICORT) 160-4.5 MCG/ACT inhaler Inhale 2 puffs into the lungs 2 (two) times daily. 1 Inhaler 6  . doxazosin (CARDURA) 4 MG tablet TAKE ONE TABLET BY MOUTH ONCE DAILY 30 tablet 5  . ELIQUIS 5 MG TABS tablet TAKE ONE TABLET BY MOUTH TWICE DAILY 60 tablet 5  . fluticasone-salmeterol (ADVAIR HFA) 115-21 MCG/ACT inhaler  Inhale 2 puffs into the lungs 2 (two) times daily.    . metoprolol (LOPRESSOR) 100 MG tablet Take 1 tablet (100 mg total) by mouth 2 (two) times daily. 180 tablet 3  . tiotropium (SPIRIVA HANDIHALER) 18 MCG inhalation capsule INHALE THE CONTENTS OF ONE CAPSULE INTO LUNGS ONCE DAILY 30 capsule 3  . torsemide (DEMADEX) 20 MG tablet Take 20 mg by mouth daily.     [name] albuterol (VENTOLIN HFA) 108 (90 BASE) MCG/ACT inhaler Inhale 2 puffs into the lungs every 4 (four) hours as needed for wheezing or shortness of breath. (Patient not taking: Reported on 05/01/2015) 1 Inhaler 1   No current facility-administered medications for this visit.    REVIEW OF SYSTEMS:  [X]  denotes positive finding, [ ]  denotes  negative finding Cardiac  Comments:  Chest pain or chest pressure:    Shortness of breath upon exertion: X   Short of breath when lying flat:    Irregular heart rhythm:        Vascular    Pain in calf, thigh, or hip brought on by ambulation:    Pain in feet at night that wakes you up from your sleep:     Blood clot in your veins:    Leg swelling:         Pulmonary    Oxygen at home:    Productive cough:     Wheezing:  X       Neurologic    Sudden weakness in arms or legs:     Sudden numbness in arms or legs:     Sudden onset of difficulty speaking or slurred speech:    Temporary loss of vision in one eye:     Problems with dizziness:         Gastrointestinal    Blood in stool:     Vomited blood:         Genitourinary    Burning when urinating:     Blood in urine:        Psychiatric    Major depression:         Hematologic    Bleeding problems:    Problems with blood clotting too easily:        Skin    Rashes or ulcers:        Constitutional    Fever or chills:      PHYSICAL EXAM: Filed Vitals:   05/01/15 1547 05/01/15 1600  BP: 169/57 163/62  Pulse: 63 63  Temp: 97.9 F (36.6 C)   TempSrc: Oral   Resp: 16   Height: 5\' 8"  (1.727 m)   Weight: 207 lb (93.895 kg)   SpO2: 96%     GENERAL: The patient is a well-nourished male, in no acute distress. The vital signs are documented above. CARDIAC: There is a regular rate and rhythm.  VASCULAR: I do not take carotid bruits. He has palpable radial pulses bilaterally. PULMONARY: There is good air exchange bilaterally without wheezing or rales. ABDOMEN: Soft and non-tender with normal pitched bowel sounds.  MUSCULOSKELETAL: There are no major deformities or cyanosis. NEUROLOGIC: No focal weakness or paresthesias are detected. SKIN: There are no ulcers or rashes noted. PSYCHIATRIC: The patient has a normal affect.  DATA:  GFR on 03/12/2015 was 14  UPPER EXTREMITY VEIN MAP: I have independently interpreted  his upper extremity vein map. On the right side his upper arm cephalic vein appears to be adequate for a fistula. Second choice would be a  basilic vein transposition on the right. He also appears to have similar anatomy in the left arm.  UPPER EXTREMITY ARTERIAL DUPLEX: Have independently interpreted his upper extremity arterial duplex. This shows triphasic Doppler signals in the radial and ulnar positions bilaterally.   MEDICAL ISSUES:  STAGE IV CHRONIC KIDNEY DISEASE: He appears to be a good candidate for a right brachiocephalic AV fistula. I can choice would be a basilic vein transposition. If neither were adequate he would require placement of an AV graft. I have explained the indications for placement of an AV fistula or AV graft. I've explained that if at all possible we will place an AV fistula.  I have reviewed the risks of placement of an AV fistula including but not limited to: failure of the fistula to mature, need for subsequent interventions, and thrombosis. In addition I have reviewed the potential complications of placement of an AV graft. These risks include, but are not limited to, graft thrombosis, graft infection, wound healing problems, bleeding, arm swelling, and steal syndrome. All the patient's questions were answered and they are agreeable to proceed with surgery. His surgery has been scheduled for 05/21/2015.   Deitra Mayo [facility] [place]: [phone]

## 2015-05-01 NOTE — Progress Notes (Signed)
Filed Vitals:   05/01/15 1547 05/01/15 1600  BP: 169/57 163/62  Pulse: 63 63  Temp: 97.9 F (36.6 C)   TempSrc: Oral   Resp: 16   Height: 5\' 8"  (1.727 m)   Weight: 207 lb (93.895 kg)   SpO2: 96%

## 2015-05-02 ENCOUNTER — Other Ambulatory Visit: Payer: Self-pay

## 2015-05-02 ENCOUNTER — Telehealth: Payer: Self-pay | Admitting: Cardiovascular Disease

## 2015-05-02 NOTE — Telephone Encounter (Signed)
New message     Request for surgical clearance:  What type of surgery is being performed? Rt arm fistula creation When is this surgery scheduled?  05-21-15 Are there any medications that need to be held prior to surgery and how long? Hold eliquis 48hr prior 1. Name of physician performing surgery?  Dr Doren Custard  2. What is your office phone and fax number? Fax [phone]

## 2015-05-06 ENCOUNTER — Encounter: Payer: Self-pay | Admitting: Cardiovascular Disease

## 2015-05-06 NOTE — Telephone Encounter (Signed)
Can we fax my letter to [facility] office? I will route to Dr. Scot Dock as well. Thanks, chris

## 2015-05-06 NOTE — Telephone Encounter (Signed)
Faxed Dr. Camillia Herter letter dated today to [facility]- Attn: Stephanie at [phone]. Confirmation received.

## 2015-05-09 ENCOUNTER — Ambulatory Visit (INDEPENDENT_AMBULATORY_CARE_PROVIDER_SITE_OTHER): Payer: Medicare PPO | Admitting: Family Medicine

## 2015-05-09 ENCOUNTER — Encounter: Payer: Self-pay | Admitting: Family Medicine

## 2015-05-09 VITALS — BP 132/60 | HR 56 | Temp 98.1°F | Ht 68.0 in

## 2015-05-09 DIAGNOSIS — J441 Chronic obstructive pulmonary disease with (acute) exacerbation: Secondary | ICD-10-CM | POA: Diagnosis not present

## 2015-05-09 MED ORDER — AMOXICILLIN-POT CLAVULANATE 875-125 MG PO TABS: 1.0000 | ORAL_TABLET | Freq: Two times a day (BID) | ORAL | Status: DC

## 2015-05-09 MED ORDER — PREDNISONE 20 MG PO TABS: ORAL_TABLET | ORAL | Status: DC

## 2015-05-09 MED ORDER — BENZONATATE 100 MG PO CAPS: 100.0000 mg | ORAL_CAPSULE | Freq: Three times a day (TID) | ORAL | Status: DC | PRN

## 2015-05-09 MED ORDER — ALBUTEROL SULFATE HFA 108 (90 BASE) MCG/ACT IN AERS
2.0000 | INHALATION_SPRAY | RESPIRATORY_TRACT | Status: DC | PRN
Start: 2015-05-09 — End: 2015-08-27

## 2015-05-09 NOTE — Patient Instructions (Signed)
Start the antibiotic and the prednisone today  Seek care immediately if worsening or not improving

## 2015-05-09 NOTE — Progress Notes (Signed)
Pre visit review using our clinic review tool, if applicable. No additional management support is needed unless otherwise documented below in the visit note. 

## 2015-05-09 NOTE — Progress Notes (Signed)
HPI:  Cough: -has "severe copd/emphysema" managed by Dr. Melvyn Novas in pulmonology and chronic cough with this - per review of OV notes for 02/2015 thought to possibly be related to spiriva HH and they were considering switch to respimat but pt did not want to -started: reports worsening cough and nasal congestion and thick sputum for 4 days along with worsening SOB, chills and malaise -denies:fever, NVD, sinus pain, CP, palpitations, weight gain, swelling in legs -has tried: cough drops -sick contacts/travel/risks: denies flu exposure, tick exposure or or Ebola risks  ROS: See pertinent positives and negatives per HPI.  Past Medical History  Diagnosis Date  . Stroke ([place]) 2012  . High cholesterol   . Hypertension   . Kidney disease     CKD4, sees [place] [facility], considering dialysis  . COPD (chronic obstructive pulmonary disease) ([place])   . CAD (coronary artery disease) [place], [name]  . Acute and chronic respiratory failure 10/22/2012  . Osteoarthritis 10/22/2012  . Pneumonia April 2014  . Atrial fibrillation [facility])     Past Surgical History  Procedure Laterality Date  . Coronary stent placement  1999    2 stents  . Appendectomy  1984  . Colon surgery  2012    partial colon removed - twisted bowel  . Hernia repair      Right inguinal  . Cystoscopy/retrograde/ureteroscopy Bilateral 04/23/2014    Procedure: CYSTOSCOPY BILATERAL RETROGRADE,  LEFT URETEROSCOPY, LEFT STENT PLACEMENT;  Surgeon: Raynelle Bring, MD;  Location: [facility];  Service: Urology;  Laterality: Bilateral;    Family History  Problem Relation Age of Onset  . Cancer Mother   . Heart disease Father   . Heart attack Father   . Cancer Sister   . Parkinson's disease Brother     Social History   Social History  . Marital Status: Married    Spouse Name: N/A  . Number of Children: 3  . Years of Education: N/A   Occupational History  . retired-worked in radio    Social History Main Topics  . Smoking  status: Former Smoker -- 1.00 packs/day for 49 years    Types: Cigarettes, Pipe, Cigars    Quit date: 02/21/2000  . Smokeless tobacco: Never Used  . Alcohol Use: No  . Drug Use: No  . Sexual Activity: Not Asked   Other Topics Concern  . 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  albuterol (VENTOLIN HFA) 108 (90 BASE) MCG/ACT inhaler, Inhale 2 puffs into the lungs every 4 (four) hours as needed for wheezing or shortness of breath., Disp: 1 Inhaler, Rfl: 0 .  amLODipine (NORVASC) 10 MG tablet, TAKE ONE TABLET BY MOUTH ONCE DAILY, Disp: 30 tablet, Rfl: 10 .  atorvastatin (LIPITOR) 20 MG tablet, Take 1 tablet (20 mg total) by mouth daily., Disp: 30 tablet, Rfl: 6 .  budesonide-formoterol (SYMBICORT) 160-4.5 MCG/ACT inhaler, Inhale 2 puffs into the lungs 2 (two) times daily., Disp: 1 Inhaler, Rfl: 6 .  doxazosin (CARDURA) 4 MG tablet, TAKE ONE TABLET BY MOUTH ONCE DAILY, Disp: 30 tablet, Rfl: 5 .  ELIQUIS 5 MG TABS tablet, TAKE ONE TABLET BY MOUTH TWICE DAILY, Disp: 60  tablet, Rfl: 5 .  metoprolol (LOPRESSOR) 100 MG tablet, Take 1 tablet (100 mg total) by mouth 2 (two) times daily., Disp: 180 tablet, Rfl: 3 .  tiotropium (SPIRIVA HANDIHALER) 18 MCG inhalation capsule, INHALE THE CONTENTS OF ONE CAPSULE INTO LUNGS ONCE DAILY, Disp: 30 capsule, Rfl: 3 .  torsemide (DEMADEX) 20 MG tablet, Take 20 mg by mouth daily. , Disp: , Rfl:  .  amoxicillin-clavulanate (AUGMENTIN) 875-125 MG tablet, Take 1 tablet by mouth 2 (two) times daily., Disp: 20 tablet, Rfl: 0 .  benzonatate (TESSALON) 100 MG capsule, Take 1 capsule (100 mg total) by mouth 3 (three) times daily as needed for cough., Disp: 30 capsule, Rfl: 0 .  predniSONE (DELTASONE) 20 MG tablet, 40 mg (2 tabs) daily for 2 days, then 20 mg (1  tab) daily for 3 days, Disp: 7 tablet, Rfl: 0  EXAM:  Filed Vitals:   05/09/15 1350  BP: 132/60  Pulse: 56  Temp: 98.1 F (36.7 C)    There is no weight on file to calculate BMI.  GENERAL: vitals reviewed and listed above, alert, oriented, appears well hydrated and in no acute distress  HEENT: atraumatic, conjunttiva clear, no obvious abnormalities on inspection of external nose and ears, normal appearance of ear canals and TMs, clear nasal congestion, mild post oropharyngeal erythema with PND, no tonsillar edema or exudate, no sinus TTP  NECK: no obvious masses on inspection  LUNGS: scattered wheezes and prolonged exp, no signs of resp distress at rest  CV: HRRR, bilat ankle edema, no JVD  MS: moves all extremities without noticeable abnormality  PSYCH: pleasant and cooperative, no obvious depression or anxiety  ASSESSMENT AND PLAN:  Discussed the following assessment and plan:  COPD exacerbation (HCC)  -likely COPD exacerbation with resp infection, given severe dz with increased purulent sputum opted for abx and prednisone - due to allergies rx augmentin -return and emergency precautions -We discussed treatment side effects, likely course, antibiotic misuse, transmission, and signs of developing a serious illness. -of course, we advised to return or notify a doctor immediately if symptoms worsen or persist or new concerns arise.    Patient Instructions  Start the antibiotic and the prednisone today  Seek care immediately if worsening or not improving     KIM, HANNAH R.

## 2015-05-13 ENCOUNTER — Other Ambulatory Visit: Payer: Self-pay

## 2015-05-13 ENCOUNTER — Inpatient Hospital Stay (HOSPITAL_COMMUNITY)
Admission: EM | Admit: 2015-05-13 | Discharge: 2015-06-04 | DRG: 981 | Disposition: A | Discharge status: Skilled Nursing Facility | Payer: Medicare PPO | Attending: Internal Medicine | Admitting: Internal Medicine

## 2015-05-13 ENCOUNTER — Emergency Department (HOSPITAL_COMMUNITY): Payer: Medicare PPO

## 2015-05-13 ENCOUNTER — Encounter (HOSPITAL_COMMUNITY): Payer: Self-pay | Admitting: *Deleted

## 2015-05-13 DIAGNOSIS — E871 Hypo-osmolality and hyponatremia: Secondary | ICD-10-CM | POA: Diagnosis present

## 2015-05-13 DIAGNOSIS — G4733 Obstructive sleep apnea (adult) (pediatric): Secondary | ICD-10-CM | POA: Diagnosis present

## 2015-05-13 DIAGNOSIS — I959 Hypotension, unspecified: Secondary | ICD-10-CM | POA: Diagnosis not present

## 2015-05-13 DIAGNOSIS — J9621 Acute and chronic respiratory failure with hypoxia: Secondary | ICD-10-CM | POA: Diagnosis present

## 2015-05-13 DIAGNOSIS — I482 Chronic atrial fibrillation, unspecified: Secondary | ICD-10-CM | POA: Insufficient documentation

## 2015-05-13 DIAGNOSIS — I129 Hypertensive chronic kidney disease with stage 1 through stage 4 chronic kidney disease, or unspecified chronic kidney disease: Secondary | ICD-10-CM | POA: Diagnosis present

## 2015-05-13 DIAGNOSIS — E7800 Pure hypercholesterolemia, unspecified: Secondary | ICD-10-CM | POA: Diagnosis present

## 2015-05-13 DIAGNOSIS — G629 Polyneuropathy, unspecified: Secondary | ICD-10-CM | POA: Diagnosis not present

## 2015-05-13 DIAGNOSIS — Z955 Presence of coronary angioplasty implant and graft: Secondary | ICD-10-CM | POA: Diagnosis not present

## 2015-05-13 DIAGNOSIS — Z951 Presence of aortocoronary bypass graft: Secondary | ICD-10-CM | POA: Diagnosis not present

## 2015-05-13 DIAGNOSIS — I5031 Acute diastolic (congestive) heart failure: Secondary | ICD-10-CM | POA: Diagnosis present

## 2015-05-13 DIAGNOSIS — F419 Anxiety disorder, unspecified: Secondary | ICD-10-CM | POA: Diagnosis present

## 2015-05-13 DIAGNOSIS — N2581 Secondary hyperparathyroidism of renal origin: Secondary | ICD-10-CM | POA: Diagnosis present

## 2015-05-13 DIAGNOSIS — N186 End stage renal disease: Secondary | ICD-10-CM | POA: Diagnosis present

## 2015-05-13 DIAGNOSIS — Z22322 Carrier or suspected carrier of Methicillin resistant Staphylococcus aureus: Secondary | ICD-10-CM

## 2015-05-13 DIAGNOSIS — Z7901 Long term (current) use of anticoagulants: Secondary | ICD-10-CM

## 2015-05-13 DIAGNOSIS — Z8249 Family history of ischemic heart disease and other diseases of the circulatory system: Secondary | ICD-10-CM | POA: Diagnosis not present

## 2015-05-13 DIAGNOSIS — E1165 Type 2 diabetes mellitus with hyperglycemia: Secondary | ICD-10-CM | POA: Diagnosis present

## 2015-05-13 DIAGNOSIS — Y95 Nosocomial condition: Secondary | ICD-10-CM | POA: Diagnosis present

## 2015-05-13 DIAGNOSIS — K5900 Constipation, unspecified: Secondary | ICD-10-CM | POA: Diagnosis present

## 2015-05-13 DIAGNOSIS — Z8701 Personal history of pneumonia (recurrent): Secondary | ICD-10-CM | POA: Diagnosis not present

## 2015-05-13 DIAGNOSIS — J440 Chronic obstructive pulmonary disease with acute lower respiratory infection: Secondary | ICD-10-CM | POA: Diagnosis not present

## 2015-05-13 DIAGNOSIS — B370 Candidal stomatitis: Secondary | ICD-10-CM | POA: Diagnosis not present

## 2015-05-13 DIAGNOSIS — E8770 Fluid overload, unspecified: Secondary | ICD-10-CM | POA: Diagnosis not present

## 2015-05-13 DIAGNOSIS — J449 Chronic obstructive pulmonary disease, unspecified: Secondary | ICD-10-CM

## 2015-05-13 DIAGNOSIS — N189 Chronic kidney disease, unspecified: Secondary | ICD-10-CM | POA: Diagnosis not present

## 2015-05-13 DIAGNOSIS — J9601 Acute respiratory failure with hypoxia: Secondary | ICD-10-CM

## 2015-05-13 DIAGNOSIS — J441 Chronic obstructive pulmonary disease with (acute) exacerbation: Secondary | ICD-10-CM | POA: Diagnosis not present

## 2015-05-13 DIAGNOSIS — Z881 Allergy status to other antibiotic agents status: Secondary | ICD-10-CM

## 2015-05-13 DIAGNOSIS — R197 Diarrhea, unspecified: Secondary | ICD-10-CM | POA: Diagnosis not present

## 2015-05-13 DIAGNOSIS — E669 Obesity, unspecified: Secondary | ICD-10-CM | POA: Diagnosis present

## 2015-05-13 DIAGNOSIS — R0602 Shortness of breath: Secondary | ICD-10-CM | POA: Diagnosis not present

## 2015-05-13 DIAGNOSIS — N184 Chronic kidney disease, stage 4 (severe): Secondary | ICD-10-CM | POA: Diagnosis not present

## 2015-05-13 DIAGNOSIS — E872 Acidosis: Secondary | ICD-10-CM | POA: Diagnosis present

## 2015-05-13 DIAGNOSIS — N281 Cyst of kidney, acquired: Secondary | ICD-10-CM | POA: Diagnosis not present

## 2015-05-13 DIAGNOSIS — R058 Other specified cough: Secondary | ICD-10-CM

## 2015-05-13 DIAGNOSIS — I25810 Atherosclerosis of coronary artery bypass graft(s) without angina pectoris: Secondary | ICD-10-CM | POA: Diagnosis not present

## 2015-05-13 DIAGNOSIS — N170 Acute kidney failure with tubular necrosis: Secondary | ICD-10-CM | POA: Diagnosis not present

## 2015-05-13 DIAGNOSIS — Z91048 Other nonmedicinal substance allergy status: Secondary | ICD-10-CM | POA: Diagnosis not present

## 2015-05-13 DIAGNOSIS — I4891 Unspecified atrial fibrillation: Secondary | ICD-10-CM | POA: Diagnosis not present

## 2015-05-13 DIAGNOSIS — D631 Anemia in chronic kidney disease: Secondary | ICD-10-CM | POA: Diagnosis present

## 2015-05-13 DIAGNOSIS — I2510 Atherosclerotic heart disease of native coronary artery without angina pectoris: Secondary | ICD-10-CM | POA: Diagnosis present

## 2015-05-13 DIAGNOSIS — Z6829 Body mass index (BMI) 29.0-29.9, adult: Secondary | ICD-10-CM

## 2015-05-13 DIAGNOSIS — Z8673 Personal history of transient ischemic attack (TIA), and cerebral infarction without residual deficits: Secondary | ICD-10-CM

## 2015-05-13 DIAGNOSIS — N179 Acute kidney failure, unspecified: Secondary | ICD-10-CM | POA: Diagnosis not present

## 2015-05-13 DIAGNOSIS — J189 Pneumonia, unspecified organism: Secondary | ICD-10-CM | POA: Diagnosis not present

## 2015-05-13 DIAGNOSIS — Z992 Dependence on renal dialysis: Secondary | ICD-10-CM | POA: Diagnosis not present

## 2015-05-13 DIAGNOSIS — T380X5A Adverse effect of glucocorticoids and synthetic analogues, initial encounter: Secondary | ICD-10-CM | POA: Diagnosis present

## 2015-05-13 DIAGNOSIS — I10 Essential (primary) hypertension: Secondary | ICD-10-CM | POA: Diagnosis present

## 2015-05-13 DIAGNOSIS — N23 Unspecified renal colic: Secondary | ICD-10-CM

## 2015-05-13 DIAGNOSIS — Z87891 Personal history of nicotine dependence: Secondary | ICD-10-CM

## 2015-05-13 DIAGNOSIS — E875 Hyperkalemia: Secondary | ICD-10-CM

## 2015-05-13 DIAGNOSIS — I509 Heart failure, unspecified: Secondary | ICD-10-CM | POA: Diagnosis not present

## 2015-05-13 DIAGNOSIS — R05 Cough: Secondary | ICD-10-CM | POA: Diagnosis not present

## 2015-05-13 DIAGNOSIS — Z79899 Other long term (current) drug therapy: Secondary | ICD-10-CM

## 2015-05-13 DIAGNOSIS — E1122 Type 2 diabetes mellitus with diabetic chronic kidney disease: Secondary | ICD-10-CM | POA: Diagnosis present

## 2015-05-13 LAB — CBC
HCT: 27.2 % — ABNORMAL LOW (ref 39.0–52.0)
Hemoglobin: 9.2 g/dL — ABNORMAL LOW (ref 13.0–17.0)
MCH: 29.5 pg (ref 26.0–34.0)
MCHC: 33.8 g/dL (ref 30.0–36.0)
MCV: 87.2 fL (ref 78.0–100.0)
PLATELETS: 209 10*3/uL (ref 150–400)
RBC: 3.12 MIL/uL — ABNORMAL LOW (ref 4.22–5.81)
RDW: 13.6 % (ref 11.5–15.5)
WBC: 10.1 10*3/uL (ref 4.0–10.5)

## 2015-05-13 LAB — BASIC METABOLIC PANEL
Anion gap: 10 (ref 5–15)
BUN: 76 mg/dL — AB (ref 6–20)
CALCIUM: 7.9 mg/dL — AB (ref 8.9–10.3)
CO2: 22 mmol/L (ref 22–32)
CREATININE: 4.52 mg/dL — AB (ref 0.61–1.24)
Chloride: 98 mmol/L — ABNORMAL LOW (ref 101–111)
GFR, EST AFRICAN AMERICAN: 13 mL/min — AB (ref >60)
GFR, EST NON AFRICAN AMERICAN: 11 mL/min — AB (ref >60)
Glucose, Bld: 162 mg/dL — ABNORMAL HIGH (ref 65–99)
Potassium: 4 mmol/L (ref 3.5–5.1)
SODIUM: 130 mmol/L — AB (ref 135–145)

## 2015-05-13 LAB — BRAIN NATRIURETIC PEPTIDE: B NATRIURETIC PEPTIDE 5: 362.1 pg/mL — AB (ref 0.0–100.0)

## 2015-05-13 MED ORDER — IPRATROPIUM BROMIDE 0.02 % IN SOLN
0.5000 mg | Freq: Once | RESPIRATORY_TRACT | Status: AC
  Administered 2015-05-13: 0.5 mg via RESPIRATORY_TRACT
  Filled 2015-05-13: qty 2.5

## 2015-05-13 MED ORDER — ALBUTEROL SULFATE (2.5 MG/3ML) 0.083% IN NEBU
INHALATION_SOLUTION | RESPIRATORY_TRACT | Status: AC
Start: 2015-05-13 — End: 2015-05-14
  Filled 2015-05-13: qty 6

## 2015-05-13 MED ORDER — PREDNISONE 20 MG PO TABS
60.0000 mg | ORAL_TABLET | Freq: Once | ORAL | Status: AC
  Administered 2015-05-13: 60 mg via ORAL
  Filled 2015-05-13: qty 3

## 2015-05-13 MED ORDER — ALBUTEROL SULFATE (2.5 MG/3ML) 0.083% IN NEBU
5.0000 mg | INHALATION_SOLUTION | Freq: Once | RESPIRATORY_TRACT | Status: AC
Start: 2015-05-13 — End: 2015-05-13
  Administered 2015-05-13: 5 mg via RESPIRATORY_TRACT

## 2015-05-13 MED ORDER — DEXTROSE 5 % IV SOLN
500.0000 mg | Freq: Once | INTRAVENOUS | Status: AC
  Administered 2015-05-13: 500 mg via INTRAVENOUS
  Filled 2015-05-13: qty 500

## 2015-05-13 MED ORDER — ALBUTEROL (5 MG/ML) CONTINUOUS INHALATION SOLN
10.0000 mg/h | INHALATION_SOLUTION | RESPIRATORY_TRACT | Status: DC
  Administered 2015-05-13: 10 mg/h via RESPIRATORY_TRACT
  Filled 2015-05-13: qty 20

## 2015-05-13 NOTE — ED Provider Notes (Signed)
CSN: [id number]     Arrival date & time 05/13/15  1946 History   First MD Initiated Contact with Patient 05/13/15 2118     Chief Complaint  Patient presents with  . Cough  . Shortness of Breath     (Consider location/radiation/quality/duration/timing/severity/associated sxs/prior Treatment) Patient is a 80 y.o. male presenting with cough.  Cough Cough characteristics:  Productive Sputum characteristics:  Pink, green, yellow and white Severity:  Moderate Duration:  10 days Progression:  Improving Chronicity:  New Smoker: yes   Relieved by:  Steroid inhaler and ipratropium inhaler Worsened by:  Nothing tried Ineffective treatments:  None tried Associated symptoms: shortness of breath   Associated symptoms: no chest pain, no chills, no fever, no myalgias and no rash     Past Medical History  Diagnosis Date  . Stroke ([place]) 2012  . High cholesterol   . Hypertension   . Kidney disease     CKD4, sees [place] [facility], considering dialysis  . COPD (chronic obstructive pulmonary disease) ([place])   . CAD (coronary artery disease) [place], [name]  . Acute and chronic respiratory failure 10/22/2012  . Osteoarthritis 10/22/2012  . Pneumonia April 2014  . Atrial fibrillation [facility])    Past Surgical History  Procedure Laterality Date  . Coronary stent placement  1999    2 stents  . Appendectomy  1984  . Colon surgery  2012    partial colon removed - twisted bowel  . Hernia repair      Right inguinal  . Cystoscopy/retrograde/ureteroscopy Bilateral 04/23/2014    Procedure: CYSTOSCOPY BILATERAL RETROGRADE,  LEFT URETEROSCOPY, LEFT STENT PLACEMENT;  Surgeon: Raynelle Bring, MD;  Location: [facility];  Service: Urology;  Laterality: Bilateral;   Family History  Problem Relation Age of Onset  . Cancer Mother   . Heart disease Father   . Heart attack Father   . Cancer Sister   . Parkinson's disease Brother    Social History  Substance Use Topics  . Smoking status: Former Smoker --  1.00 packs/day for 49 years    Types: Cigarettes, Pipe, Cigars    Quit date: 02/21/2000  . Smokeless tobacco: Never Used  . Alcohol Use: No    Review of Systems  Constitutional: Negative for fever and chills.  HENT: Negative for congestion and drooling.   Eyes: Negative for photophobia.  Respiratory: Positive for cough and shortness of breath.   Cardiovascular: Negative for chest pain and palpitations.  Gastrointestinal: Negative for nausea, vomiting, abdominal pain and diarrhea.  Endocrine: Negative for polydipsia and polyuria.  Musculoskeletal: Negative for myalgias.  Skin: Negative for pallor and rash.  All other systems reviewed and are negative.     Allergies  Levaquin and Yellow dyes (non-tartrazine)  Home Medications   Prior to Admission medications   Medication Sig Start Date End Date Taking? Authorizing Provider  amoxicillin-clavulanate (AUGMENTIN) 875-125 MG tablet Take 1 tablet by mouth 2 (two) times daily. 05/09/15  Yes [name], DO  benzonatate (TESSALON) 100 MG capsule Take 1 capsule (100 mg total) by mouth 3 (three) times daily as needed for cough. 05/09/15  Yes [name], DO  tiotropium (SPIRIVA HANDIHALER) 18 MCG inhalation capsule INHALE THE CONTENTS OF ONE CAPSULE INTO LUNGS ONCE DAILY Patient taking differently: Place 18 mcg into inhaler and inhale 2 (two) times daily.  04/25/15  Yes Tanda Rockers, MD  albuterol (VENTOLIN HFA) 108 (90 BASE) MCG/ACT inhaler Inhale 2 puffs into the lungs every 4 (four)  hours as needed for wheezing or shortness of breath. 05/09/15   [name], DO  amLODipine (NORVASC) 10 MG tablet TAKE ONE TABLET BY MOUTH ONCE DAILY 04/15/15   Burnell Blanks, MD  atorvastatin (LIPITOR) 20 MG tablet Take 1 tablet (20 mg total) by mouth daily. 03/25/15   Burnell Blanks, MD  budesonide-formoterol [facility]) 160-4.5 MCG/ACT inhaler Inhale 2 puffs into the lungs 2 (two) times daily. 11/07/14   Tanda Rockers, MD  doxazosin  (CARDURA) 4 MG tablet TAKE ONE TABLET BY MOUTH ONCE DAILY 11/15/14   Burnell Blanks, MD  ELIQUIS 5 MG TABS tablet TAKE ONE TABLET BY MOUTH TWICE DAILY 04/17/15   Burnell Blanks, MD  metoprolol (LOPRESSOR) 100 MG tablet Take 1 tablet (100 mg total) by mouth 2 (two) times daily. 05/25/14   Burnell Blanks, MD  predniSONE (DELTASONE) 20 MG tablet 40 mg (2 tabs) daily for 2 days, then 20 mg (1 tab) daily for 3 days Patient not taking: Reported on 05/13/2015 05/09/15   [name], DO  torsemide (DEMADEX) 20 MG tablet Take 20 mg by mouth daily.  08/16/14   Historical Provider, MD   BP 146/56 mmHg  Pulse 58  Temp(Src) 97.4 F (36.3 C) (Oral)  Resp 28  SpO2 92% Physical Exam  Constitutional: He appears well-developed and well-nourished.  HENT:  Head: Normocephalic and atraumatic.  Eyes: Conjunctivae are normal. Pupils are equal, round, and reactive to light.  Neck: Normal range of motion.  Cardiovascular: Normal rate.   Pulmonary/Chest: Effort normal and breath sounds normal. No respiratory distress.  Abdominal: Soft. He exhibits no distension.  Musculoskeletal: Normal range of motion. He exhibits edema (1+ to mid shin).  Neurological: He is alert.  Skin: Skin is warm and dry. No rash noted.  Nursing note and vitals reviewed.   ED Course  Procedures (including critical care time)  CRITICAL CARE Performed by: Merrily Pew   Total critical care time: 35 minutes  Critical care time was exclusive of separately billable procedures and treating other patients.  Critical care was necessary to treat or prevent imminent or life-threatening deterioration.  Critical care was time spent personally by me on the following activities: development of treatment plan with patient and/or surrogate as well as nursing, discussions with consultants, evaluation of patient's response to treatment, examination of patient, obtaining history from patient or surrogate, ordering and  performing treatments and interventions, ordering and review of laboratory studies, ordering and review of radiographic studies, pulse oximetry and re-evaluation of patient's condition.   Labs Review Labs Reviewed  BASIC METABOLIC PANEL - Abnormal; Notable for the following:    Sodium 130 (*)    Chloride 98 (*)    Glucose, Bld 162 (*)    BUN 76 (*)    Creatinine, Ser 4.52 (*)    Calcium 7.9 (*)    GFR calc non Af Amer 11 (*)    GFR calc Af Amer 13 (*)    All other components within normal limits  CBC - Abnormal; Notable for the following:    RBC 3.12 (*)    Hemoglobin 9.2 (*)    HCT 27.2 (*)    All other components within normal limits    Imaging Review Dg Chest 2 View  05/13/2015  CLINICAL DATA:  80 year old with 1 week history of productive cough and severe shortness of breath. Former smoker with current history of COPD. EXAM: CHEST  2 VIEW COMPARISON:  05/28/2014 and earlier. FINDINGS: AP  erect and lateral imaging was performed. Cardiac silhouette mildly enlarged with a right coronary artery stent. Thoracic aorta atherosclerotic, unchanged. Prominent central pulmonary arteries, unchanged. Scarring in the lower lobes, right greater than left, unchanged. Hyperinflation, emphysematous changes in the upper lobes, and mild central peribronchial thickening, unchanged. No new pulmonary parenchymal abnormalities. Degenerative disc disease and spondylosis involving the thoracic spine. IMPRESSION: COPD/emphysema. Scarring in the lower lobes, right greater than left. No acute cardiopulmonary disease. Stable examination. Electronically Signed   By: Evangeline Dakin M.D.   On: 05/13/2015 21:03   I have personally reviewed and evaluated these images and lab results as part of my medical decision-making.   EKG Interpretation   Date/Time:  Monday May 13 2015 19:52:54 EST Ventricular Rate:  62 PR Interval:    QRS Duration: 72 QT Interval:  402 QTC Calculation: 408 R Axis:   46 Text  Interpretation:  Atrial fibrillation Septal infarct , age  undetermined Abnormal ECG Confirmed by [facility] MD, Corene Cornea [phone]) on  05/13/2015 9:19:56 PM      MDM   Final diagnoses:  COPD exacerbation ([place])  Acute respiratory failure with hypoxia ([place])   80 year old male with history of COPD presents to the emergency department today for approximately 10 days of progressively worsening productive cough, shortness of breath worse with exertion. Tight lung sounds initially with some end expiratory wheezing and also some lower extremity edema. He also has acute on chronic kidney disease and was actually scheduled for a fistular placement next Tuesday. His initial differential diagnosis included COPD, CHF versus less likely primary embolus or metabolic acidosis. Felt that COPD exacerbation was the most likely as had no chest pain Continuous albuterol was given along with ipratropium, steroids and azithromycin patient had no improvement with this. Still having oxygen 90-91% at rest and down as low as 84% while ambulating. Discussed with the patient and family and we will admit the patient for further treatment.     Merrily Pew, MD 05/14/15 [phone]

## 2015-05-13 NOTE — ED Notes (Signed)
Pt taken to xray 

## 2015-05-13 NOTE — ED Notes (Signed)
Pt c/o cough and shortness of breath x 1 week. Saw his PCP Thursday who prescribed 4 medicines. States that he has been taking everything as prescribed. Pt 92% on RA.

## 2015-05-13 NOTE — ED Notes (Addendum)
Pt placed on 2L [place]. 94%

## 2015-05-14 ENCOUNTER — Encounter (HOSPITAL_COMMUNITY): Payer: Self-pay | Admitting: Family Medicine

## 2015-05-14 ENCOUNTER — Telehealth: Payer: Self-pay | Admitting: Cardiovascular Disease

## 2015-05-14 DIAGNOSIS — T380X5A Adverse effect of glucocorticoids and synthetic analogues, initial encounter: Secondary | ICD-10-CM | POA: Diagnosis present

## 2015-05-14 DIAGNOSIS — N186 End stage renal disease: Secondary | ICD-10-CM | POA: Diagnosis present

## 2015-05-14 DIAGNOSIS — Z8673 Personal history of transient ischemic attack (TIA), and cerebral infarction without residual deficits: Secondary | ICD-10-CM | POA: Diagnosis not present

## 2015-05-14 DIAGNOSIS — I959 Hypotension, unspecified: Secondary | ICD-10-CM | POA: Diagnosis not present

## 2015-05-14 DIAGNOSIS — Z955 Presence of coronary angioplasty implant and graft: Secondary | ICD-10-CM | POA: Diagnosis not present

## 2015-05-14 DIAGNOSIS — N179 Acute kidney failure, unspecified: Secondary | ICD-10-CM | POA: Diagnosis present

## 2015-05-14 DIAGNOSIS — Z22322 Carrier or suspected carrier of Methicillin resistant Staphylococcus aureus: Secondary | ICD-10-CM | POA: Diagnosis not present

## 2015-05-14 DIAGNOSIS — Z8249 Family history of ischemic heart disease and other diseases of the circulatory system: Secondary | ICD-10-CM | POA: Diagnosis not present

## 2015-05-14 DIAGNOSIS — I2510 Atherosclerotic heart disease of native coronary artery without angina pectoris: Secondary | ICD-10-CM | POA: Diagnosis present

## 2015-05-14 DIAGNOSIS — R197 Diarrhea, unspecified: Secondary | ICD-10-CM | POA: Diagnosis not present

## 2015-05-14 DIAGNOSIS — Z8701 Personal history of pneumonia (recurrent): Secondary | ICD-10-CM | POA: Diagnosis not present

## 2015-05-14 DIAGNOSIS — G629 Polyneuropathy, unspecified: Secondary | ICD-10-CM | POA: Diagnosis not present

## 2015-05-14 DIAGNOSIS — I25810 Atherosclerosis of coronary artery bypass graft(s) without angina pectoris: Secondary | ICD-10-CM

## 2015-05-14 DIAGNOSIS — E872 Acidosis: Secondary | ICD-10-CM | POA: Diagnosis present

## 2015-05-14 DIAGNOSIS — G4733 Obstructive sleep apnea (adult) (pediatric): Secondary | ICD-10-CM | POA: Diagnosis present

## 2015-05-14 DIAGNOSIS — Y95 Nosocomial condition: Secondary | ICD-10-CM | POA: Diagnosis present

## 2015-05-14 DIAGNOSIS — I10 Essential (primary) hypertension: Secondary | ICD-10-CM

## 2015-05-14 DIAGNOSIS — J9601 Acute respiratory failure with hypoxia: Secondary | ICD-10-CM | POA: Diagnosis not present

## 2015-05-14 DIAGNOSIS — I509 Heart failure, unspecified: Secondary | ICD-10-CM | POA: Diagnosis not present

## 2015-05-14 DIAGNOSIS — Z881 Allergy status to other antibiotic agents status: Secondary | ICD-10-CM | POA: Diagnosis not present

## 2015-05-14 DIAGNOSIS — J441 Chronic obstructive pulmonary disease with (acute) exacerbation: Secondary | ICD-10-CM | POA: Diagnosis present

## 2015-05-14 DIAGNOSIS — Z951 Presence of aortocoronary bypass graft: Secondary | ICD-10-CM | POA: Diagnosis not present

## 2015-05-14 DIAGNOSIS — E1122 Type 2 diabetes mellitus with diabetic chronic kidney disease: Secondary | ICD-10-CM | POA: Diagnosis present

## 2015-05-14 DIAGNOSIS — E669 Obesity, unspecified: Secondary | ICD-10-CM | POA: Diagnosis present

## 2015-05-14 DIAGNOSIS — F419 Anxiety disorder, unspecified: Secondary | ICD-10-CM | POA: Diagnosis present

## 2015-05-14 DIAGNOSIS — Z87891 Personal history of nicotine dependence: Secondary | ICD-10-CM | POA: Diagnosis not present

## 2015-05-14 DIAGNOSIS — N189 Chronic kidney disease, unspecified: Secondary | ICD-10-CM | POA: Diagnosis present

## 2015-05-14 DIAGNOSIS — E1165 Type 2 diabetes mellitus with hyperglycemia: Secondary | ICD-10-CM | POA: Diagnosis present

## 2015-05-14 DIAGNOSIS — I482 Chronic atrial fibrillation: Secondary | ICD-10-CM | POA: Diagnosis present

## 2015-05-14 DIAGNOSIS — J9621 Acute and chronic respiratory failure with hypoxia: Secondary | ICD-10-CM | POA: Diagnosis present

## 2015-05-14 DIAGNOSIS — K5900 Constipation, unspecified: Secondary | ICD-10-CM | POA: Diagnosis present

## 2015-05-14 DIAGNOSIS — Z79899 Other long term (current) drug therapy: Secondary | ICD-10-CM | POA: Diagnosis not present

## 2015-05-14 DIAGNOSIS — I129 Hypertensive chronic kidney disease with stage 1 through stage 4 chronic kidney disease, or unspecified chronic kidney disease: Secondary | ICD-10-CM | POA: Diagnosis present

## 2015-05-14 DIAGNOSIS — E7800 Pure hypercholesterolemia, unspecified: Secondary | ICD-10-CM | POA: Diagnosis present

## 2015-05-14 DIAGNOSIS — D631 Anemia in chronic kidney disease: Secondary | ICD-10-CM | POA: Diagnosis present

## 2015-05-14 DIAGNOSIS — N2581 Secondary hyperparathyroidism of renal origin: Secondary | ICD-10-CM | POA: Diagnosis present

## 2015-05-14 DIAGNOSIS — Z992 Dependence on renal dialysis: Secondary | ICD-10-CM | POA: Diagnosis not present

## 2015-05-14 DIAGNOSIS — B370 Candidal stomatitis: Secondary | ICD-10-CM | POA: Diagnosis not present

## 2015-05-14 DIAGNOSIS — I5031 Acute diastolic (congestive) heart failure: Secondary | ICD-10-CM | POA: Diagnosis present

## 2015-05-14 DIAGNOSIS — J189 Pneumonia, unspecified organism: Secondary | ICD-10-CM | POA: Diagnosis not present

## 2015-05-14 DIAGNOSIS — N184 Chronic kidney disease, stage 4 (severe): Secondary | ICD-10-CM | POA: Diagnosis not present

## 2015-05-14 DIAGNOSIS — Z91048 Other nonmedicinal substance allergy status: Secondary | ICD-10-CM | POA: Diagnosis not present

## 2015-05-14 DIAGNOSIS — Z7901 Long term (current) use of anticoagulants: Secondary | ICD-10-CM | POA: Diagnosis not present

## 2015-05-14 DIAGNOSIS — Z6829 Body mass index (BMI) 29.0-29.9, adult: Secondary | ICD-10-CM | POA: Diagnosis not present

## 2015-05-14 DIAGNOSIS — E871 Hypo-osmolality and hyponatremia: Secondary | ICD-10-CM | POA: Diagnosis present

## 2015-05-14 DIAGNOSIS — J440 Chronic obstructive pulmonary disease with acute lower respiratory infection: Secondary | ICD-10-CM | POA: Diagnosis not present

## 2015-05-14 LAB — GLUCOSE, CAPILLARY
GLUCOSE-CAPILLARY: 189 mg/dL — AB (ref 65–99)
Glucose-Capillary: 198 mg/dL — ABNORMAL HIGH (ref 65–99)

## 2015-05-14 LAB — CBC
HEMATOCRIT: 26.8 % — AB (ref 39.0–52.0)
Hemoglobin: 9.1 g/dL — ABNORMAL LOW (ref 13.0–17.0)
MCH: 29.4 pg (ref 26.0–34.0)
MCHC: 34 g/dL (ref 30.0–36.0)
MCV: 86.7 fL (ref 78.0–100.0)
Platelets: 216 10*3/uL (ref 150–400)
RBC: 3.09 MIL/uL — ABNORMAL LOW (ref 4.22–5.81)
RDW: 13.7 % (ref 11.5–15.5)
WBC: 11.7 10*3/uL — AB (ref 4.0–10.5)

## 2015-05-14 LAB — BASIC METABOLIC PANEL
ANION GAP: 12 (ref 5–15)
BUN: 82 mg/dL — AB (ref 6–20)
CALCIUM: 7.9 mg/dL — AB (ref 8.9–10.3)
CO2: 19 mmol/L — ABNORMAL LOW (ref 22–32)
Chloride: 97 mmol/L — ABNORMAL LOW (ref 101–111)
Creatinine, Ser: 4.78 mg/dL — ABNORMAL HIGH (ref 0.61–1.24)
GFR calc Af Amer: 12 mL/min — ABNORMAL LOW (ref >60)
GFR, EST NON AFRICAN AMERICAN: 11 mL/min — AB (ref >60)
GLUCOSE: 209 mg/dL — AB (ref 65–99)
POTASSIUM: 4 mmol/L (ref 3.5–5.1)
SODIUM: 128 mmol/L — AB (ref 135–145)

## 2015-05-14 LAB — TROPONIN I

## 2015-05-14 LAB — MRSA PCR SCREENING: MRSA BY PCR: POSITIVE — AB

## 2015-05-14 MED ORDER — BUDESONIDE-FORMOTEROL FUMARATE 160-4.5 MCG/ACT IN AERO
2.0000 | INHALATION_SPRAY | Freq: Two times a day (BID) | RESPIRATORY_TRACT | Status: DC
  Administered 2015-05-14 – 2015-06-04 (×37): 2 via RESPIRATORY_TRACT
  Filled 2015-05-14 (×3): qty 6

## 2015-05-14 MED ORDER — INSULIN ASPART 100 UNIT/ML ~~LOC~~ SOLN
0.0000 [IU] | Freq: Every day | SUBCUTANEOUS | Status: DC
  Administered 2015-05-20: 2 [IU] via SUBCUTANEOUS

## 2015-05-14 MED ORDER — INSULIN ASPART 100 UNIT/ML ~~LOC~~ SOLN
0.0000 [IU] | Freq: Three times a day (TID) | SUBCUTANEOUS | Status: DC
  Administered 2015-05-14: 3 [IU] via SUBCUTANEOUS
  Administered 2015-05-15: 5 [IU] via SUBCUTANEOUS
  Administered 2015-05-15 – 2015-05-16 (×3): 3 [IU] via SUBCUTANEOUS
  Administered 2015-05-16: 2 [IU] via SUBCUTANEOUS
  Administered 2015-05-16 – 2015-05-18 (×5): 3 [IU] via SUBCUTANEOUS
  Administered 2015-05-19: 5 [IU] via SUBCUTANEOUS
  Administered 2015-05-19: 3 [IU] via SUBCUTANEOUS
  Administered 2015-05-19: 2 [IU] via SUBCUTANEOUS
  Administered 2015-05-20: 3 [IU] via SUBCUTANEOUS
  Administered 2015-05-20: 5 [IU] via SUBCUTANEOUS
  Administered 2015-05-21 – 2015-05-22 (×2): 3 [IU] via SUBCUTANEOUS
  Administered 2015-05-22 – 2015-05-24 (×4): 2 [IU] via SUBCUTANEOUS
  Administered 2015-05-25: 3 [IU] via SUBCUTANEOUS
  Administered 2015-05-26 – 2015-05-28 (×4): 2 [IU] via SUBCUTANEOUS
  Administered 2015-05-29: 3 [IU] via SUBCUTANEOUS
  Administered 2015-05-30 – 2015-06-02 (×3): 2 [IU] via SUBCUTANEOUS

## 2015-05-14 MED ORDER — ALBUTEROL SULFATE (2.5 MG/3ML) 0.083% IN NEBU
2.5000 mg | INHALATION_SOLUTION | RESPIRATORY_TRACT | Status: DC
  Administered 2015-05-14 (×6): 2.5 mg via RESPIRATORY_TRACT
  Filled 2015-05-14 (×7): qty 3

## 2015-05-14 MED ORDER — CHLORHEXIDINE GLUCONATE CLOTH 2 % EX PADS
6.0000 | MEDICATED_PAD | Freq: Every day | CUTANEOUS | Status: AC
  Administered 2015-05-16 – 2015-05-19 (×3): 6 via TOPICAL

## 2015-05-14 MED ORDER — ONDANSETRON HCL 4 MG/2ML IJ SOLN: 4.0000 mg | Freq: Four times a day (QID) | INTRAMUSCULAR | Status: DC | PRN

## 2015-05-14 MED ORDER — MORPHINE SULFATE (PF) 2 MG/ML IV SOLN
1.0000 mg | INTRAVENOUS | Status: DC | PRN
  Filled 2015-05-14: qty 1

## 2015-05-14 MED ORDER — FUROSEMIDE 10 MG/ML IJ SOLN: 40.0000 mg | Freq: Once | INTRAMUSCULAR | Status: DC

## 2015-05-14 MED ORDER — DOXAZOSIN MESYLATE 4 MG PO TABS
4.0000 mg | ORAL_TABLET | Freq: Every day | ORAL | Status: DC
  Administered 2015-05-14 – 2015-05-20 (×8): 4 mg via ORAL
  Filled 2015-05-14 (×9): qty 1

## 2015-05-14 MED ORDER — ALBUTEROL SULFATE (2.5 MG/3ML) 0.083% IN NEBU
2.5000 mg | INHALATION_SOLUTION | RESPIRATORY_TRACT | Status: DC | PRN
  Administered 2015-05-15: 2.5 mg via RESPIRATORY_TRACT
  Filled 2015-05-14: qty 3

## 2015-05-14 MED ORDER — METHYLPREDNISOLONE SODIUM SUCC 125 MG IJ SOLR: 60.0000 mg | Freq: Four times a day (QID) | INTRAMUSCULAR | Status: DC

## 2015-05-14 MED ORDER — APIXABAN 5 MG PO TABS
5.0000 mg | ORAL_TABLET | Freq: Two times a day (BID) | ORAL | Status: DC
  Administered 2015-05-14 (×2): 5 mg via ORAL
  Filled 2015-05-14 (×3): qty 1

## 2015-05-14 MED ORDER — DEXTROMETHORPHAN POLISTIREX ER 30 MG/5ML PO SUER
30.0000 mg | Freq: Two times a day (BID) | ORAL | Status: DC | PRN
  Administered 2015-05-15 – 2015-05-17 (×4): 30 mg via ORAL
  Filled 2015-05-14 (×7): qty 5

## 2015-05-14 MED ORDER — TORSEMIDE 20 MG PO TABS
20.0000 mg | ORAL_TABLET | Freq: Every day | ORAL | Status: DC
  Administered 2015-05-14: 20 mg via ORAL
  Filled 2015-05-14: qty 1

## 2015-05-14 MED ORDER — DEXTROMETHORPHAN POLISTIREX ER 30 MG/5ML PO SUER: 30.0000 mg | Freq: Two times a day (BID) | ORAL | Status: DC | PRN

## 2015-05-14 MED ORDER — SODIUM CHLORIDE 0.9 % IJ SOLN
3.0000 mL | Freq: Two times a day (BID) | INTRAMUSCULAR | Status: DC
  Administered 2015-05-14 – 2015-06-03 (×30): 3 mL via INTRAVENOUS

## 2015-05-14 MED ORDER — DOXYCYCLINE HYCLATE 100 MG PO TABS
100.0000 mg | ORAL_TABLET | Freq: Two times a day (BID) | ORAL | Status: AC
  Administered 2015-05-14 – 2015-05-19 (×12): 100 mg via ORAL
  Filled 2015-05-14 (×13): qty 1

## 2015-05-14 MED ORDER — ONDANSETRON HCL 4 MG PO TABS: 4.0000 mg | ORAL_TABLET | Freq: Four times a day (QID) | ORAL | Status: DC | PRN

## 2015-05-14 MED ORDER — ATORVASTATIN CALCIUM 20 MG PO TABS
20.0000 mg | ORAL_TABLET | Freq: Every day | ORAL | Status: DC
  Administered 2015-05-14 – 2015-06-03 (×21): 20 mg via ORAL
  Filled 2015-05-14 (×21): qty 1

## 2015-05-14 MED ORDER — ALBUTEROL SULFATE (2.5 MG/3ML) 0.083% IN NEBU: 2.5000 mg | INHALATION_SOLUTION | Freq: Three times a day (TID) | RESPIRATORY_TRACT | Status: DC

## 2015-05-14 MED ORDER — SODIUM CHLORIDE 0.9 % IV BOLUS (SEPSIS)
500.0000 mL | Freq: Once | INTRAVENOUS | Status: DC
Start: 2015-05-14 — End: 2015-06-04

## 2015-05-14 MED ORDER — DM-GUAIFENESIN ER 30-600 MG PO TB12
1.0000 | ORAL_TABLET | Freq: Once | ORAL | Status: DC
  Filled 2015-05-14 (×2): qty 1

## 2015-05-14 MED ORDER — METOPROLOL TARTRATE 100 MG PO TABS
100.0000 mg | ORAL_TABLET | Freq: Two times a day (BID) | ORAL | Status: DC
  Administered 2015-05-14 – 2015-05-18 (×9): 100 mg via ORAL
  Filled 2015-05-14 (×6): qty 1
  Filled 2015-05-14: qty 2
  Filled 2015-05-14 (×2): qty 1
  Filled 2015-05-14: qty 2

## 2015-05-14 MED ORDER — MUPIROCIN 2 % EX OINT
1.0000 "application " | TOPICAL_OINTMENT | Freq: Two times a day (BID) | CUTANEOUS | Status: AC
  Administered 2015-05-14 – 2015-05-19 (×10): 1 via NASAL
  Filled 2015-05-14 (×2): qty 22

## 2015-05-14 MED ORDER — METHYLPREDNISOLONE SODIUM SUCC 125 MG IJ SOLR
60.0000 mg | Freq: Four times a day (QID) | INTRAMUSCULAR | Status: DC
Start: 2015-05-14 — End: 2015-05-15
  Administered 2015-05-14 – 2015-05-15 (×4): 60 mg via INTRAVENOUS
  Filled 2015-05-14: qty 2
  Filled 2015-05-14 (×2): qty 0.96
  Filled 2015-05-14: qty 2
  Filled 2015-05-14 (×2): qty 0.96
  Filled 2015-05-14: qty 2

## 2015-05-14 MED ORDER — AMLODIPINE BESYLATE 10 MG PO TABS
10.0000 mg | ORAL_TABLET | Freq: Every day | ORAL | Status: DC
  Administered 2015-05-14 – 2015-05-15 (×2): 10 mg via ORAL
  Filled 2015-05-14 (×2): qty 1

## 2015-05-14 MED ORDER — TIOTROPIUM BROMIDE MONOHYDRATE 18 MCG IN CAPS
18.0000 ug | ORAL_CAPSULE | Freq: Every day | RESPIRATORY_TRACT | Status: DC
  Administered 2015-05-14 – 2015-06-03 (×15): 18 ug via RESPIRATORY_TRACT
  Filled 2015-05-14 (×5): qty 5

## 2015-05-14 NOTE — Telephone Encounter (Signed)
New message      [facility]  He is having a problem with his lungs and heart (AFIB).  Wife want to talk to the nurse to give her an update on what is going on

## 2015-05-14 NOTE — H&P (Signed)
History and Physical  Patient Name: Terry [name]     [id number]    DOB: 08-08-1934    DOA: 05/13/2015 Referring physician: Merrily Pew, MD PCP: [name], DO      Chief Complaint: Cough and dyspnea  HPI: Terry [name] is a 80 y.o. male with a past medical history significant for COPD Gold III FEV1 <30%, HTN, CAD s/p CABG, and CKD IV with upcoming fistula placement who presents with one week worsening cough, dyspnea and purulent sputum.  The patient started to develop worsening cough, chest congestion, sputum, and dyspnea 7-10 days ago.  Four days ago, he was seen by his PCP, started on prednisone and Augmentin, and Tessalon.  In the last few days now, his cough and dyspnea are worse and unrelieved.  In the ED, the patient is hypoxic to high 80s on room air.  He was in moderate respiratory distress and had only mild relief with continuous nebulized bronchodilators.  He had no WBC elevation and a chest x-ray showed no focal consolidation.  TRH were asked to admit for COPD exacerbation failed outpatient therapy.     Review of Systems:  Pt complains of cough, dyspnea, rib pain, sputum (yellow and pink), chronic gradual leg swelling/edema. Pt denies any fever, chills, emesis.  All other systems negative except as just noted or noted in the history of present illness.   Allergies:  Allergies  Allergen Reactions  . Levaquin [Levofloxacin In D5w] Itching  . Yellow Dyes (Non-Tartrazine) Other (See Comments)    Burns arms    Home medications: 1. Amlodipine 10 mg daily 2. Atorvastatin 20 mg daily 3. Metoprolol 100 mg twice daily 4. Apixaban 5 mg twice daily 5. Torsemide 20 mg daily 6. Albuterol as needed 7. Symbicort twice daily 8. Spiriva once daily Prednisone, Tessalon, and Augmentin the last 4 days   Past medical history: 1. Coronary artery disease status post CABG 2. Atrial fibrillation on anticoagulant 3. History of stroke 4. Essential hypertension 5. CKD stage  IV 6. COPD, Gold stage III, FEV1 less than 30%  Past surgical history: 1. Appendectomy 2. Partial colectomy 3. Hernia repair  Family history:  Mother, breast cancer. Brother, Parkinson's disease, deceased. Father, heart attack.   Social History:  Patient lives with his wife. He is from [place] originally. He used to work in [employer]. He is a former smoker quit 15 years ago. He is retired but still drives, and does not use cane or a walker.       Physical Exam: BP 136/70 mmHg  Pulse 69  Temp(Src) 97.4 F (36.3 C) (Oral)  Resp 19  SpO2 90% General appearance: Well-developed, adult male, alert and in distress from dyspnea and cough.  Sitting hunched in chair, tripod. Eyes: Anicteric, conjunctiva pink, lids and lashes normal.     ENT: No nasal deformity, discharge, or epistaxis.  OP tacky without lesions.   Skin: Warm and dry.  Somewhat flushed from coughing. Cardiac: Regular rate, heart sounds distant and obscured by coarse breath sounds, but no murmurs appreciated.  Moderate non-pitting LE edema.  No JVD. Respiratory: Tachypnea, coughing.  Sounds congested.  Poor air movement, minimal wheezing.  Some airway sounds are present. Abdomen: Abdomen soft without rigidity.  No TTP. No ascites, distension.   Neuro: Sensorium intact and responding to questions, attention normal.  Speech is fluent.  Moves all extremities equally and with normal coordination.    Psych: Behavior appropriate.  Affect tired.  No evidence of aural or visual  hallucinations or delusions.       Labs on Admission:  The metabolic panel shows hyponatremia, acute on chronic renal failure. Serum creatinine 4.5 mg/dL from a baseline of 3.5 mg/dL. The BNP is only 300 pg per mL. The complete blood count shows chronic stable normocytic anemia of renal disease.   Radiological Exams on Admission: Personally reviewed: Dg Chest 2 View 05/13/2015  Changes consistent with chronic lung disease without pulmonary edema or  focal consolidation.    EKG: Independently reviewed. Atrial fibrillation, rate 50s.    Assessment/Plan 1. COPD exacerbation:  This has failed outpatient therapy.  The patient is moving air very poorly despite two duoNebs and continuous albuterol.   -Methylprednisolone 60 mg q6hrs -Doxycycline 100 mg twice daily -Albuterol scheduled and PRN -Morphine 1 mg for dyspnea/cough -Dextromethorphan guaifenesin for cough -Low threshold for ABG and BiPAP   2. Hyponatremia:  This is new.  I suspect this is from mild intravascular volume depletion. -Fluid resuscitation and repeat sodium  3. Acute on chronic kidney injury:  Suspect this is prerenal rather than progression of disease, this is unclear at present. -Fluid challenge and repeat BMP tomorrow  4. Chronic normocytic anemia:  Stable.  5. CAD and HTN:  Stable.  -Continue statin, amlodipine, metoprolol and doxazosin  6. Afib:  Stable. CHADS2Vasc 3 for age and hypertension. Continue metoprolol and apixaban     DVT PPx: Apixaban Diet: Heart healthy Consultants: None Code Status: Full Family Communication: Wife present at the bedside  Medical decision making: What exists of the patient's previous chart was reviewed in depth and the case was discussed with Dr. Dayna Barker. Patient seen 1:13 AM on 05/14/2015.  Disposition Plan:  Admit to stepdown for IV steroids, doxycycline, and frequent nebulized bronchodilators.  Anticipate 3-4 day hospitalization.      Edwin Dada [facility] Pager [phone]

## 2015-05-14 NOTE — Progress Notes (Addendum)
Patient admitted after midnight, please see H&P.    COPD exacerbation:   failed outpatient therapy -follows with Dr. Melvyn Novas -Methylprednisolone 60 mg q6hrs -Doxycycline 100 mg twice daily -Albuterol scheduled and PRN -Morphine 1 mg for dyspnea/cough -Dextromethorphan guaifenesin for cough -Low threshold for ABG and BiPAP  Hyponatremia:  This is new. -correct some with blood sugars -SSI  Acute on chronic kidney injury?  Will request last renal function panel from Dr. Joelyn Oms- Sept 20th BUN: 51 and CR: 3.92 -scheduled for fistula on 11/29- Dr. Scot Dock -potassium stable Check BMP in AM- if not improved, consult renal  Chronic normocytic anemia:  Stable.  CAD and HTN:  Stable.  -Continue statin, amlodipine, metoprolol and doxazosin  Afib:  Stable. CHADS2Vasc 3 for age and hypertension. Continue metoprolol and apixaban- need to look at changing to coumadin as renal function worsening  Hyperglycemia -secondary to steroids -SSI  Will transfer to floor  Terry [name]

## 2015-05-14 NOTE — Progress Notes (Signed)
Pt admitted to the unit at 1915. Pt mental status is alert, verbal, oriented. Pt oriented to room, staff, and call bell. Call bell within reach. Visitor guidelines reviewed w/ pt and/or family.

## 2015-05-14 NOTE — Telephone Encounter (Signed)
Spoke with pt's wife. Pt is in hospital with COPD exacerbation.  She is asking if cardiologist needs to see pt.  I told her attending would consult cardiology if they felt it was needed.  I told her she could also talk to attending and request cardiology consult if she wanted pt seen by cardiology.  Will make Dr. [name] aware of pt's admission.

## 2015-05-14 NOTE — Progress Notes (Signed)
Pt report called to 5W RN-Kelly. Wife at bedside to go with pt to new room. Belongs packed to be sent to new room. [facility]T aware of transfer. Will continue to monitor until transfer.

## 2015-05-15 ENCOUNTER — Other Ambulatory Visit (HOSPITAL_COMMUNITY): Payer: Medicare PPO

## 2015-05-15 ENCOUNTER — Inpatient Hospital Stay (HOSPITAL_COMMUNITY): Payer: Medicare PPO

## 2015-05-15 DIAGNOSIS — N179 Acute kidney failure, unspecified: Secondary | ICD-10-CM

## 2015-05-15 DIAGNOSIS — J9601 Acute respiratory failure with hypoxia: Secondary | ICD-10-CM

## 2015-05-15 DIAGNOSIS — I5031 Acute diastolic (congestive) heart failure: Secondary | ICD-10-CM

## 2015-05-15 DIAGNOSIS — N184 Chronic kidney disease, stage 4 (severe): Secondary | ICD-10-CM

## 2015-05-15 LAB — PROTIME-INR
INR: 1.63 — AB (ref 0.00–1.49)
PROTHROMBIN TIME: 19.3 s — AB (ref 11.6–15.2)

## 2015-05-15 LAB — IRON AND TIBC
Iron: 67 ug/dL (ref 45–182)
Saturation Ratios: 23 % (ref 17.9–39.5)
TIBC: 297 ug/dL (ref 250–450)
UIBC: 230 ug/dL

## 2015-05-15 LAB — CBC
HEMATOCRIT: 25.4 % — AB (ref 39.0–52.0)
HEMOGLOBIN: 8.8 g/dL — AB (ref 13.0–17.0)
MCH: 30.1 pg (ref 26.0–34.0)
MCHC: 34.6 g/dL (ref 30.0–36.0)
MCV: 87 fL (ref 78.0–100.0)
Platelets: 205 10*3/uL (ref 150–400)
RBC: 2.92 MIL/uL — AB (ref 4.22–5.81)
RDW: 13.6 % (ref 11.5–15.5)
WBC: 12.3 10*3/uL — ABNORMAL HIGH (ref 4.0–10.5)

## 2015-05-15 LAB — GLUCOSE, CAPILLARY
GLUCOSE-CAPILLARY: 226 mg/dL — AB (ref 65–99)
GLUCOSE-CAPILLARY: 158 mg/dL — AB (ref 65–99)
Glucose-Capillary: 183 mg/dL — ABNORMAL HIGH (ref 65–99)

## 2015-05-15 LAB — HEPARIN LEVEL (UNFRACTIONATED)

## 2015-05-15 LAB — BASIC METABOLIC PANEL
ANION GAP: 11 (ref 5–15)
BUN: 93 mg/dL — ABNORMAL HIGH (ref 6–20)
CHLORIDE: 97 mmol/L — AB (ref 101–111)
CO2: 21 mmol/L — AB (ref 22–32)
CREATININE: 5.17 mg/dL — AB (ref 0.61–1.24)
Calcium: 8 mg/dL — ABNORMAL LOW (ref 8.9–10.3)
GFR calc non Af Amer: 10 mL/min — ABNORMAL LOW (ref >60)
GFR, EST AFRICAN AMERICAN: 11 mL/min — AB (ref >60)
Glucose, Bld: 193 mg/dL — ABNORMAL HIGH (ref 65–99)
POTASSIUM: 4.3 mmol/L (ref 3.5–5.1)
Sodium: 129 mmol/L — ABNORMAL LOW (ref 135–145)

## 2015-05-15 LAB — URINE MICROSCOPIC-ADD ON

## 2015-05-15 LAB — URINALYSIS, ROUTINE W REFLEX MICROSCOPIC
BILIRUBIN URINE: NEGATIVE
Glucose, UA: 250 mg/dL — AB
Ketones, ur: NEGATIVE mg/dL
LEUKOCYTES UA: NEGATIVE
NITRITE: NEGATIVE
Protein, ur: 100 mg/dL — AB
SPECIFIC GRAVITY, URINE: 1.014 (ref 1.005–1.030)
pH: 5 (ref 5.0–8.0)

## 2015-05-15 LAB — DIFFERENTIAL
BASOS ABS: 0 10*3/uL (ref 0.0–0.1)
Basophils Relative: 0 %
EOS ABS: 0 10*3/uL (ref 0.0–0.7)
Eosinophils Relative: 0 %
LYMPHS ABS: 0.3 10*3/uL — AB (ref 0.7–4.0)
LYMPHS PCT: 2 %
Monocytes Absolute: 0.2 10*3/uL (ref 0.1–1.0)
Monocytes Relative: 2 %
NEUTROS ABS: 11.5 10*3/uL — AB (ref 1.7–7.7)
NEUTROS PCT: 96 %

## 2015-05-15 LAB — APTT
APTT: 32 s (ref 24–37)
aPTT: 32 seconds (ref 24–37)

## 2015-05-15 LAB — CREATININE, URINE, RANDOM: CREATININE, URINE: 59.19 mg/dL

## 2015-05-15 LAB — SODIUM, URINE, RANDOM

## 2015-05-15 MED ORDER — PREDNISONE 50 MG PO TABS
60.0000 mg | ORAL_TABLET | Freq: Every day | ORAL | Status: DC
  Administered 2015-05-15 – 2015-05-16 (×2): 60 mg via ORAL
  Filled 2015-05-15 (×2): qty 1

## 2015-05-15 MED ORDER — COUMADIN BOOK
Freq: Once | Status: AC
Start: 2015-05-15 — End: 2015-05-15
  Administered 2015-05-15: 11:00:00
  Filled 2015-05-15: qty 1

## 2015-05-15 MED ORDER — WARFARIN - PHARMACIST DOSING INPATIENT
Freq: Every day | Status: DC
  Administered 2015-05-15: 18:00:00

## 2015-05-15 MED ORDER — HEPARIN (PORCINE) IN NACL 100-0.45 UNIT/ML-% IJ SOLN
1900.0000 [IU]/h | INTRAMUSCULAR | Status: DC
  Administered 2015-05-15: 1200 [IU]/h via INTRAVENOUS
  Filled 2015-05-15: qty 250

## 2015-05-15 MED ORDER — WARFARIN VIDEO
Freq: Once | Status: AC
  Administered 2015-05-15: 18:00:00

## 2015-05-15 MED ORDER — AMLODIPINE BESYLATE 5 MG PO TABS
5.0000 mg | ORAL_TABLET | Freq: Every day | ORAL | Status: DC
  Administered 2015-05-16: 5 mg via ORAL
  Filled 2015-05-15: qty 1

## 2015-05-15 MED ORDER — IPRATROPIUM-ALBUTEROL 0.5-2.5 (3) MG/3ML IN SOLN
3.0000 mL | Freq: Three times a day (TID) | RESPIRATORY_TRACT | Status: DC
  Administered 2015-05-15 – 2015-05-16 (×5): 3 mL via RESPIRATORY_TRACT
  Filled 2015-05-15 (×6): qty 3

## 2015-05-15 MED ORDER — PREDNISONE 10 MG PO TABS: 10.0000 mg | ORAL_TABLET | Freq: Every day | ORAL | Status: DC

## 2015-05-15 MED ORDER — METHYLPREDNISOLONE SODIUM SUCC 125 MG IJ SOLR: 60.0000 mg | INTRAMUSCULAR | Status: DC

## 2015-05-15 MED ORDER — FUROSEMIDE 10 MG/ML IJ SOLN
160.0000 mg | Freq: Four times a day (QID) | INTRAVENOUS | Status: DC
  Administered 2015-05-15 – 2015-05-17 (×8): 160 mg via INTRAVENOUS
  Filled 2015-05-15 (×10): qty 16

## 2015-05-15 MED ORDER — WARFARIN SODIUM 7.5 MG PO TABS
7.5000 mg | ORAL_TABLET | Freq: Once | ORAL | Status: AC
  Administered 2015-05-15: 7.5 mg via ORAL
  Filled 2015-05-15: qty 1

## 2015-05-15 MED ORDER — FUROSEMIDE 10 MG/ML IJ SOLN
40.0000 mg | Freq: Two times a day (BID) | INTRAMUSCULAR | Status: DC
  Administered 2015-05-15: 40 mg via INTRAVENOUS
  Filled 2015-05-15: qty 4

## 2015-05-15 NOTE — Consult Note (Signed)
Reason for Consult:CKD with worsening Referring Physician: Dr. [name] is an 80 y.o. male.  HPI: 45 with over 45yrHTN, DJD, Severe COPD, and know advancing late stage 4 CKD.  Baseline Cr is 3.9. Admitted after failing outpatient treatment of COPD exac.  C/o worsening edema x 1 wk, cough with moving or lying down, orthop, sitting up to breathe.  No fevers or chill but was on outpatient AB.  No CP.  SOB <10 ft.  Hx of CKD for AKI nonresolving and HTN.  Crs here 4.58, 4.72, and 5.12 daily since admit..[name] Poor appetite and hand cramps.  Known anemia and HPTH.  Hx CAD, hx ureteral stents in past.  Was to get fistula on 11/29  Constitutional: weak, sob Eyes: glasses only, known catarracts Ears, nose, mouth, throat, and face: negative Respiratory: as above Cardiovascular: as above Gastrointestinal: constip Genitourinary:stream slow at times Integument/breast: negative Hematologic/lymphatic: anemia Musculoskeletal:large joint arth Endocrine: negative Allergic/Immunologic: levaquin, yellow dyes    Primary Nephrologist Terry [name].    Past Medical History  Diagnosis Date  . [place]  . High cholesterol   . Hypertension   . Kidney disease     CKD4, sees Terry [name], considering dialysis  . [place]   . CAD (coronary artery disease) [place] . Acute and chronic respiratory failure 10/22/2012  . Osteoarthritis 10/22/2012  . Pneumonia April 2014  . Atrial fibrillation ([facility]     Past Surgical History  Procedure Laterality Date  . Coronary stent placement  1999    2 stents  . Appendectomy  1984  . Colon surgery  2012    partial colon removed - twisted bowel  . Hernia repair      Right inguinal  . Cystoscopy/retrograde/ureteroscopy Bilateral 04/23/2014    Procedure: CYSTOSCOPY BILATERAL RETROGRADE,  LEFT URETEROSCOPY, LEFT STENT PLACEMENT;  Surgeon: LRaynelle Bring MD;  Location: [facility];  Service: Urology;   Laterality: Bilateral;    Family History  Problem Relation Age of Onset  . Cancer Mother     Breast  . Heart disease Father   . Heart attack Father   . Parkinson's disease Brother     Social History:  reports that he quit smoking about 15 years ago. His smoking use included Cigarettes, Pipe, and Cigars. He has a 49 pack-year smoking history. He has never used smokeless tobacco. He reports that he does not drink alcohol or use illicit drugs.  Allergies:  Allergies  Allergen Reactions  . Levaquin [Levofloxacin In D5w] Itching  . Yellow Dyes (Non-Tartrazine) Other (See Comments)    Burns arms    Medications:  I have reviewed the patient's current medications. Prior to Admission:  Prescriptions prior to admission  Medication Sig Dispense Refill Last Dose  . albuterol (VENTOLIN HFA) 108 (90 BASE) MCG/ACT inhaler Inhale 2 puffs into the lungs every 4 (four) hours as needed for wheezing or shortness of breath. 1 Inhaler 0 05/13/2015 at Unknown time  . amLODipine (NORVASC) 10 MG tablet TAKE ONE TABLET BY MOUTH ONCE DAILY (Patient taking differently: TAKE 10 MG BY MOUTH ONCE DAILY) 30 tablet 10 05/13/2015 at Unknown time  . atorvastatin (LIPITOR) 20 MG tablet Take 1 tablet (20 mg total) by mouth daily. (Patient taking differently: Take 20 mg by mouth daily at 6 PM. ) 30 tablet 6 05/12/2015 at Unknown time  . budesonide-formoterol (SYMBICORT) 160-4.5 MCG/ACT inhaler Inhale 2 puffs into the lungs 2 (two)  times daily. 1 Inhaler 6 05/13/2015 at Unknown time  . cloNIDine (CATAPRES) 0.1 MG tablet Take 0.1 mg by mouth 2 (two) times daily as needed (hypertension).   6 months  . doxazosin (CARDURA) 4 MG tablet TAKE ONE TABLET BY MOUTH ONCE DAILY (Patient taking differently: TAKE 4 MG BY MOUTH ONCE DAILY) 30 tablet 5 05/12/2015 at Unknown time  . ELIQUIS 5 MG TABS tablet TAKE ONE TABLET BY MOUTH TWICE DAILY (Patient taking differently: TAKE 5 MG BY MOUTH TWICE DAILY) 60 tablet 5 05/13/2015 at 1800  .  metoprolol (LOPRESSOR) 100 MG tablet Take 1 tablet (100 mg total) by mouth 2 (two) times daily. 180 tablet 3 05/13/2015 at 800  . tiotropium (SPIRIVA HANDIHALER) 18 MCG inhalation capsule INHALE THE CONTENTS OF ONE CAPSULE INTO LUNGS ONCE DAILY (Patient taking differently: Place 18 mcg into inhaler and inhale daily. ) 30 capsule 3 05/13/2015 at Unknown time  . torsemide (DEMADEX) 20 MG tablet Take 20 mg by mouth daily.    05/13/2015 at Unknown time  . predniSONE (DELTASONE) 20 MG tablet 40 mg (2 tabs) daily for 2 days, then 20 mg (1 tab) daily for 3 days (Patient not taking: Reported on 05/13/2015) 7 tablet 0 Completed Course at Unknown time     Results for orders placed or performed during the hospital encounter of 05/13/15 (from the past 48 hour(s))  Basic metabolic panel     Status: Abnormal   Collection Time: 05/13/15  8:17 PM  Result Value Ref Range   Sodium 130 ([name]) 135 - 145 mmol/[name]   Potassium 4.0 3.5 - 5.1 mmol/[name]   Chloride 98 ([name]) 101 - 111 mmol/[name]   CO2 22 22 - 32 mmol/[name]   Glucose, Bld 162 (H) 65 - 99 mg/dL   BUN 76 (H) 6 - 20 mg/dL   Creatinine, Ser 4.52 (H) 0.61 - 1.24 mg/dL   Calcium 7.9 ([name]) 8.9 - 10.3 mg/dL   GFR calc non Af Amer 11 ([name]) >60 mL/min   GFR calc Af Amer 13 ([name]) >60 mL/min    Comment: (NOTE) The eGFR has been calculated using the CKD EPI equation. This calculation has not been validated in all clinical situations. eGFR's persistently <60 mL/min signify possible Chronic Kidney Disease.    Anion gap 10 5 - 15  CBC     Status: Abnormal   Collection Time: 05/13/15  8:17 PM  Result Value Ref Range   WBC 10.1 4.0 - 10.5 K/uL   RBC 3.12 ([name]) 4.22 - 5.81 MIL/uL   Hemoglobin 9.2 ([name]) 13.0 - 17.0 g/dL   HCT 27.2 ([name]) 39.0 - 52.0 %   MCV 87.2 78.0 - 100.0 fL   MCH 29.5 26.0 - 34.0 pg   MCHC 33.8 30.0 - 36.0 g/dL   RDW 13.6 11.5 - 15.5 %   Platelets 209 150 - 400 K/uL  Brain natriuretic peptide     Status: Abnormal   Collection Time: 05/13/15  8:17 PM  Result Value  Ref Range   B Natriuretic Peptide 362.1 (H) 0.0 - 100.0 pg/mL  Basic metabolic panel     Status: Abnormal   Collection Time: 05/14/15  3:38 AM  Result Value Ref Range   Sodium 128 ([name]) 135 - 145 mmol/[name]   Potassium 4.0 3.5 - 5.1 mmol/[name]   Chloride 97 ([name]) 101 - 111 mmol/[name]   CO2 19 ([name]) 22 - 32 mmol/[name]   Glucose, Bld 209 (H) 65 - 99 mg/dL   BUN 82 (H) 6 -  20 mg/dL   Creatinine, Ser 4.78 (H) 0.61 - 1.24 mg/dL   Calcium 7.9 ([name]) 8.9 - 10.3 mg/dL   GFR calc non Af Amer 11 ([name]) >60 mL/min   GFR calc Af Amer 12 ([name]) >60 mL/min    Comment: (NOTE) The eGFR has been calculated using the CKD EPI equation. This calculation has not been validated in all clinical situations. eGFR's persistently <60 mL/min signify possible Chronic Kidney Disease.    Anion gap 12 5 - 15  CBC     Status: Abnormal   Collection Time: 05/14/15  3:38 AM  Result Value Ref Range   WBC 11.7 (H) 4.0 - 10.5 K/uL   RBC 3.09 ([name]) 4.22 - 5.81 MIL/uL   Hemoglobin 9.1 ([name]) 13.0 - 17.0 g/dL   HCT 26.8 ([name]) 39.0 - 52.0 %   MCV 86.7 78.0 - 100.0 fL   MCH 29.4 26.0 - 34.0 pg   MCHC 34.0 30.0 - 36.0 g/dL   RDW 13.7 11.5 - 15.5 %   Platelets 216 150 - 400 K/uL  Troponin I     Status: None   Collection Time: 05/14/15  3:38 AM  Result Value Ref Range   Troponin I <0.03 <0.031 ng/mL    Comment:        NO INDICATION OF MYOCARDIAL INJURY.   MRSA PCR Screening     Status: Abnormal   Collection Time: 05/14/15  7:18 AM  Result Value Ref Range   MRSA by PCR POSITIVE (A) NEGATIVE    Comment:        The GeneXpert MRSA Assay (FDA approved for NASAL specimens only), is one component of a comprehensive MRSA colonization surveillance program. It is not intended to diagnose MRSA infection nor to guide or monitor treatment for MRSA infections. RESULT CALLED TO, READ BACK BY AND VERIFIED WITH: H. MILLS RN 9:55 05/14/15 ([email or web address])   Glucose, capillary     Status: Abnormal   Collection Time: 05/14/15  4:44 PM  Result Value Ref Range    Glucose-Capillary 189 (H) 65 - 99 mg/dL   Comment 1 Notify RN    Comment 2 Document in Chart   Glucose, capillary     Status: Abnormal   Collection Time: 05/14/15 10:47 PM  Result Value Ref Range   Glucose-Capillary 198 (H) 65 - 99 mg/dL   Comment 1 Notify RN    Comment 2 Document in Chart   CBC     Status: Abnormal   Collection Time: 05/15/15 12:17 AM  Result Value Ref Range   WBC 12.3 (H) 4.0 - 10.5 K/uL   RBC 2.92 ([name]) 4.22 - 5.81 MIL/uL   Hemoglobin 8.8 ([name]) 13.0 - 17.0 g/dL   HCT 25.4 ([name]) 39.0 - 52.0 %   MCV 87.0 78.0 - 100.0 fL   MCH 30.1 26.0 - 34.0 pg   MCHC 34.6 30.0 - 36.0 g/dL   RDW 13.6 11.5 - 15.5 %   Platelets 205 150 - 400 K/uL  Basic metabolic panel     Status: Abnormal   Collection Time: 05/15/15 12:17 AM  Result Value Ref Range   Sodium 129 ([name]) 135 - 145 mmol/[name]   Potassium 4.3 3.5 - 5.1 mmol/[name]   Chloride 97 ([name]) 101 - 111 mmol/[name]   CO2 21 ([name]) 22 - 32 mmol/[name]   Glucose, Bld 193 (H) 65 - 99 mg/dL   BUN 93 (H) 6 - 20 mg/dL   Creatinine, Ser 5.17 (H) 0.61 - 1.24 mg/dL   Calcium  8.0 ([name]) 8.9 - 10.3 mg/dL   GFR calc non Af Amer 10 ([name]) >60 mL/min   GFR calc Af Amer 11 ([name]) >60 mL/min    Comment: (NOTE) The eGFR has been calculated using the CKD EPI equation. This calculation has not been validated in all clinical situations. eGFR's persistently <60 mL/min signify possible Chronic Kidney Disease.    Anion gap 11 5 - 15  Glucose, capillary     Status: Abnormal   Collection Time: 05/15/15  7:54 AM  Result Value Ref Range   Glucose-Capillary 183 (H) 65 - 99 mg/dL  Glucose, capillary     Status: Abnormal   Collection Time: 05/15/15 11:54 AM  Result Value Ref Range   Glucose-Capillary 226 (H) 65 - 99 mg/dL    Dg Chest 2 View  05/13/2015  CLINICAL DATA:  80 year old with 1 week history of productive cough and severe shortness of breath. Former smoker with current history of COPD. EXAM: CHEST  2 VIEW COMPARISON:  05/28/2014 and earlier. FINDINGS: AP erect and lateral  imaging was performed. Cardiac silhouette mildly enlarged with a right coronary artery stent. Thoracic aorta atherosclerotic, unchanged. Prominent central pulmonary arteries, unchanged. Scarring in the lower lobes, right greater than left, unchanged. Hyperinflation, emphysematous changes in the upper lobes, and mild central peribronchial thickening, unchanged. No new pulmonary parenchymal abnormalities. Degenerative disc disease and spondylosis involving the thoracic spine. IMPRESSION: COPD/emphysema. Scarring in the lower lobes, right greater than left. No acute cardiopulmonary disease. Stable examination. Electronically Signed   By: Evangeline Dakin M.D.   On: 05/13/2015 21:03    ROS Blood pressure 134/65, pulse 67, temperature 98.3 F (36.8 C), temperature source Oral, resp. rate 19, height 5' 8"  (1.727 m), weight 99.292 kg (218 lb 14.4 oz), SpO2 94 %. Physical Exam Physical Examination: General appearance - SOB, coughing, pale Mental status - alert, oriented to person, place, and time Eyes - catarracts, fundi ok Mouth - mucous membranes moist, pharynx normal without lesions Neck - adenopathy noted PCL Lymphatics - posterior cervical nodes Chest - wheezing noted bilat, rales noted bibasilar, decreased air entry noted bilat Heart - decreased, normal S1 and S2 Abdomen - obese, distended, pos bs, nontender, liver down 5 cm Extremities - pedal edema 3-4+ + Skin - bruises on arms  Assessment/Plan: 1 CKD 4 with exacerbation  Vol xs, suspect worse because of cardiac status and infx but many of prob are vol.  ^ solute, acidemia also.  Need to r/o other causes ie AIN, obstruction but needs tx 2 Copd part of picture on tx 3 Hypertension: not an issue, lower meds 4. Anemia eval and tx 5. Metabolic Bone Disease: eval and tx 6 CAD 7 DJD P High dose Lasix, U/s , Urine Na and Cr, Fe, Pth, UA  Reis Pienta,Terry [name] 05/15/2015, 12:17 PM       4.52, 4,72

## 2015-05-15 NOTE — Progress Notes (Signed)
Utilization review completed. Skylyn Slezak, RN, BSN. 

## 2015-05-15 NOTE — Progress Notes (Signed)
ANTICOAGULATION CONSULT NOTE - Follow Up Consult  Pharmacy Consult for Heparin and Coumadin Indication: atrial fibrillation  Patient Measurements: Height: 5\' 8"  (172.7 cm) Weight: 218 lb 14.4 oz (99.292 kg) IBW/kg (Calculated) : 68.4 Heparin Dosing Weight: 88 kg  Labs:  Recent Labs  05/13/15 2017 05/14/15 0338 05/15/15 0017 05/15/15 1220 05/15/15 1931  HGB 9.2* 9.1* 8.8*  --   --   HCT 27.2* 26.8* 25.4*  --   --   PLT 209 216 205  --   --   APTT  --   --   --  32 32  LABPROT  --   --   --  19.3*  --   INR  --   --   --  1.63*  --   HEPARINUNFRC  --   --   --  >2.00*  --   CREATININE 4.52* 4.78* 5.17*  --   --   TROPONINI  --  <0.03  --   --   --    Assessment:   Heparin and Coumadin begun today.  Switched from Apixaban due to worsening renal function.  Baseline heparin level >2, aPTT 32 seconds, INR 1.63. Heparin level inaccurate due to recent Apixaban dose, last 11/22 ~10pm.    Initial aPTT is 32 seconds on heparin drip at 1200 units/hr. Same as baseline, so unexpected.  Spoke with lab, who confirmed result is from 19:31 blood sample.  Re-run, with result of 32.5 seconds.  No known infusion problems.   Coumadin 7.5 mg given tonight.  Goal of Therapy:  INR 2-3 Heparin level 0.3-0.7 units/ml aPTT 66-102 seconds Monitor platelets by anticoagulation protocol: Yes   Plan:    Increase heparin drip to 1500 units/hr.   Next aPTT, heparin level, PT/INR and CBC in am.  Arty Baumgartner, [place] Pager: [phone] 05/15/2015,9:35 PM

## 2015-05-15 NOTE — Progress Notes (Addendum)
PROGRESS NOTE  NICKOLAI ZERBE [id number] DOB: September 24, 1934 DOA: 05/13/2015 PCP: [name], DO Brief history 80 year old male with a history of COPD, hypertension, CAD/CABG, CKD stage IV, atrial fibrillation diagnosed October 2015 on apixaban presented with one-week history of worsening cough and shortness of breath. The patient was seen by his primary care provider 4 days prior to admission and prescribed Augmentin and prednisone without improvement. He presented to the emergency department where he was noted to have oxygen saturation of 80% on room air and found to be in respiratory distress. The patient was started on doxycycline and Solu-Medrol. The patient has also been describing symptoms of orthopnea and PND with increasing abdominal girth and lower extremity edema for the past week. Assessment/Plan: Acute respiratory failure with hypoxia -Secondary to CHF and COPD -Presently stable on 4 L nasal cannula -Wean oxygen for saturation greater than 92% -Pulmonary hygiene Acute diastolic CHF -Patient is approximate 5 pounds over his dry weight -clinically volume overloaded at present -Start intravenous furosemide -Echocardiogram -Daily weights -Dry weight is approximately 203 pounds Acute on chronic renal failure (CKD 4) -Baseline creatinine 3.2-3.4 -I have consulted nephrology -monitor with diuresis -Previously scheduled for fistula creation 05/21/2015 COPD exacerbation  -Patient has >60 pk years -wean solumedrol to prednisone -Pulmonary hygiene  -Continue bronchodilators  -Wean oxygen for saturation greater than 92%  Atrial fibrillation  -CHADS-VASc = 6 -continue apixaban -Continue metoprolol tartrate Hypertension -Continue amlodipine, metoprolol tartrate, Cardura Diabetes mellitus type 2 -Check hemoglobin A1c -NovoLog sliding scale Coronary artery disease -Cardiac cath 11/30/1997. Mild distal Left main stenosis, 25% proximal LAD, minor irregularities  Circumflex, 95% mid RCA stenosis treated with 3.5 x 32 mm bare metal stent -no angina presently  Family Communication:   Pt at beside Disposition Plan:   Expect at least 2-3 more days prior to d/c home      Procedures/Studies: Dg Chest 2 View  05/13/2015  CLINICAL DATA:  80 year old with 1 week history of productive cough and severe shortness of breath. Former smoker with current history of COPD. EXAM: CHEST  2 VIEW COMPARISON:  05/28/2014 and earlier. FINDINGS: AP erect and lateral imaging was performed. Cardiac silhouette mildly enlarged with a right coronary artery stent. Thoracic aorta atherosclerotic, unchanged. Prominent central pulmonary arteries, unchanged. Scarring in the lower lobes, right greater than left, unchanged. Hyperinflation, emphysematous changes in the upper lobes, and mild central peribronchial thickening, unchanged. No new pulmonary parenchymal abnormalities. Degenerative disc disease and spondylosis involving the thoracic spine. IMPRESSION: COPD/emphysema. Scarring in the lower lobes, right greater than left. No acute cardiopulmonary disease. Stable examination. Electronically Signed   By: Evangeline Dakin M.D.   On: 05/13/2015 21:03         Subjective:  Patient continues to complain of nonproductive cough and shortness of breath. He states that this has not significantly improved. Denies any fevers, chills, chest pain, nausea, vomiting or diarrhea. Denies any headache, visual disturbance, abdominal pain, dysuria, hematuria.   Objective: Filed Vitals:   05/14/15 1643 05/14/15 2101 05/14/15 2216 05/15/15 0551  BP:   136/62 134/65  Pulse:  81 76 67  Temp:   97.6 F (36.4 C) 98.3 F (36.8 C)  TempSrc:   Oral Oral  Resp:  16 19 19   Height:      Weight:      SpO2: 97% 97% 94% 94%    Intake/Output Summary (Last 24 hours) at 05/15/15 0759 Last data filed at 05/14/15 2339  Gross  per 24 hour  Intake      0 ml  Output    200 ml  Net   -200 ml   Weight  change:  Exam:   General:  Pt is alert, follows commands appropriately, not in acute distress  HEENT: No icterus, No thrush, No neck mass, [place]/AT  Cardiovascular: RRR, S1/S2, no rubs, no gallops  Respiratory: bibasilar rales. No wheezing. Good air movement.   Abdomen: Soft/+BS, non tender, non distended, no guarding  Extremities: 2+ LE edema, No lymphangitis, No petechiae, No rashes, no synovitis  Data Reviewed: Basic Metabolic Panel:  Recent Labs Lab 05/13/15 2017 05/14/15 0338 05/15/15 0017  NA 130* 128* 129*  K 4.0 4.0 4.3  CL 98* 97* 97*  CO2 22 19* 21*  GLUCOSE 162* 209* 193*  BUN 76* 82* 93*  CREATININE 4.52* 4.78* 5.17*  CALCIUM 7.9* 7.9* 8.0*   Liver Function Tests: No results for input(s): AST, ALT, ALKPHOS, BILITOT, PROT, ALBUMIN in the last 168 hours. No results for input(s): LIPASE, AMYLASE in the last 168 hours. No results for input(s): AMMONIA in the last 168 hours. CBC:  Recent Labs Lab 05/13/15 2017 05/14/15 0338 05/15/15 0017  WBC 10.1 11.7* 12.3*  HGB 9.2* 9.1* 8.8*  HCT 27.2* 26.8* 25.4*  MCV 87.2 86.7 87.0  PLT 209 216 205   Cardiac Enzymes:  Recent Labs Lab 05/14/15 0338  TROPONINI <0.03   BNP: Invalid input(s): POCBNP CBG:  Recent Labs Lab 05/14/15 1644 05/14/15 2247  GLUCAP 189* 198*    Recent Results (from the past 240 hour(s))  MRSA PCR Screening     Status: Abnormal   Collection Time: 05/14/15  7:18 AM  Result Value Ref Range Status   MRSA by PCR POSITIVE (A) NEGATIVE Final    Comment:        The GeneXpert MRSA Assay (FDA approved for NASAL specimens only), is one component of a comprehensive MRSA colonization surveillance program. It is not intended to diagnose MRSA infection nor to guide or monitor treatment for MRSA infections. RESULT CALLED TO, READ BACK BY AND VERIFIED WITH: H. MILLS RN 9:55 05/14/15 ([email or web address])      Scheduled Meds: . amLODipine  10 mg Oral Daily  . apixaban  5 mg Oral BID  .  atorvastatin  20 mg Oral q1800  . budesonide-formoterol  2 puff Inhalation BID  . Chlorhexidine Gluconate Cloth  6 each Topical Q0600  . doxazosin  4 mg Oral Daily  . doxycycline  100 mg Oral Q12H  . furosemide  40 mg Intravenous BID  . insulin aspart  0-15 Units Subcutaneous TID WC  . insulin aspart  0-5 Units Subcutaneous QHS  . ipratropium-albuterol  3 mL Nebulization 3 times per day  . methylPREDNISolone (SOLU-MEDROL) injection  60 mg Intravenous Q24H  . metoprolol  100 mg Oral BID  . mupirocin ointment  1 application Nasal BID  . sodium chloride  500 mL Intravenous Once  . sodium chloride  3 mL Intravenous Q12H  . tiotropium  18 mcg Inhalation Daily   Continuous Infusions:    Youssouf Shipley, DO  [facility] Pager [phone]  If 7PM-7AM, please contact night-coverage [email or web address] Password [email or web address] 05/15/2015, 7:59 AM   LOS: 1 day

## 2015-05-15 NOTE — Progress Notes (Signed)
ANTICOAGULATION CONSULT NOTE - Initial Consult  Pharmacy Consult for warfarin + heparin Indication: atrial fibrillation  Allergies  Allergen Reactions  . Levaquin [Levofloxacin In D5w] Itching  . Yellow Dyes (Non-Tartrazine) Other (See Comments)    Burns arms    Patient Measurements: Height: 5\' 8"  (172.7 cm) Weight: 208 lb 12.4 oz (94.7 kg) IBW/kg (Calculated) : 68.4 Heparin Dosing Weight: 88kg  Vital Signs: Temp: 98.3 F (36.8 C) (11/23 0551) Temp Source: Oral (11/23 0551) BP: 134/65 mmHg (11/23 0551) Pulse Rate: 67 (11/23 0551)  Labs:  Recent Labs  05/13/15 2017 05/14/15 0338 05/15/15 0017  HGB 9.2* 9.1* 8.8*  HCT 27.2* 26.8* 25.4*  PLT 209 216 205  CREATININE 4.52* 4.78* 5.17*  TROPONINI  --  <0.03  --     Estimated Creatinine Clearance: 13.1 mL/min (by C-G formula based on Cr of 5.17).   Medical History: Past Medical History  Diagnosis Date  . Stroke ([place]) 2012  . High cholesterol   . Hypertension   . Kidney disease     CKD4, sees [place] [facility], considering dialysis  . COPD (chronic obstructive pulmonary disease) ([place])   . CAD (coronary artery disease) [place], [name]  . Acute and chronic respiratory failure 10/22/2012  . Osteoarthritis 10/22/2012  . Pneumonia April 2014  . Atrial fibrillation ([facility])     Medications:  Infusions:  . heparin      Assessment: 73 yom presented to the hospital with cough and dyspnea. He was on apixaban but due to worsening renal function, he will be switched to heparin + warfarin. Baseline INR, heparin level and aPTT currently unavailable at this time. Last dose of apixaban was last night. H/H is slightly low and platelets are WNL. No bleeding noted.   Goal of Therapy:  Heparin level 0.3-0.7 units/ml aPTT 66-102 seconds Monitor platelets by anticoagulation protocol: Yes   Plan:  - Check a baseline heparin level, aPTT and INR - Start heparin gtt at 1200 units/hr - Check an 8 hour aPTT - Warfarin 7.5mg  PO x  1 tonight - Daily heparin level, aPTT, INR and CBC - Warfarin book + video to patient to initiate education process  Milagro Belmares, Rande Lawman 05/15/2015,10:50 AM

## 2015-05-15 NOTE — Telephone Encounter (Signed)
Thanks!    Chris.

## 2015-05-16 ENCOUNTER — Inpatient Hospital Stay (HOSPITAL_COMMUNITY): Payer: Medicare PPO

## 2015-05-16 DIAGNOSIS — I509 Heart failure, unspecified: Secondary | ICD-10-CM

## 2015-05-16 LAB — CBC
HCT: 25 % — ABNORMAL LOW (ref 39.0–52.0)
Hemoglobin: 8.6 g/dL — ABNORMAL LOW (ref 13.0–17.0)
MCH: 29.8 pg (ref 26.0–34.0)
MCHC: 34.4 g/dL (ref 30.0–36.0)
MCV: 86.5 fL (ref 78.0–100.0)
PLATELETS: 218 10*3/uL (ref 150–400)
RBC: 2.89 MIL/uL — AB (ref 4.22–5.81)
RDW: 13.4 % (ref 11.5–15.5)
WBC: 13.7 10*3/uL — AB (ref 4.0–10.5)

## 2015-05-16 LAB — UREA NITROGEN, URINE: UREA NITROGEN UR: 488 mg/dL

## 2015-05-16 LAB — GLUCOSE, CAPILLARY
GLUCOSE-CAPILLARY: 169 mg/dL — AB (ref 65–99)
GLUCOSE-CAPILLARY: 146 mg/dL — AB (ref 65–99)
GLUCOSE-CAPILLARY: 189 mg/dL — AB (ref 65–99)
Glucose-Capillary: 183 mg/dL — ABNORMAL HIGH (ref 65–99)
Glucose-Capillary: 193 mg/dL — ABNORMAL HIGH (ref 65–99)

## 2015-05-16 LAB — APTT: APTT: 33 s (ref 24–37)

## 2015-05-16 LAB — HEPATITIS B SURFACE ANTIGEN: HEP B S AG: NEGATIVE

## 2015-05-16 LAB — MAGNESIUM: MAGNESIUM: 1.9 mg/dL (ref 1.7–2.4)

## 2015-05-16 LAB — COMPREHENSIVE METABOLIC PANEL
ALBUMIN: 3 g/dL — AB (ref 3.5–5.0)
ALT: 22 U/L (ref 17–63)
AST: 18 U/L (ref 15–41)
Alkaline Phosphatase: 38 U/L (ref 38–126)
Anion gap: 13 (ref 5–15)
BUN: 109 mg/dL — AB (ref 6–20)
CHLORIDE: 95 mmol/L — AB (ref 101–111)
CO2: 20 mmol/L — AB (ref 22–32)
CREATININE: 5.51 mg/dL — AB (ref 0.61–1.24)
Calcium: 7.8 mg/dL — ABNORMAL LOW (ref 8.9–10.3)
GFR calc Af Amer: 10 mL/min — ABNORMAL LOW (ref >60)
GFR calc non Af Amer: 9 mL/min — ABNORMAL LOW (ref >60)
GLUCOSE: 185 mg/dL — AB (ref 65–99)
Potassium: 3.9 mmol/L (ref 3.5–5.1)
SODIUM: 128 mmol/L — AB (ref 135–145)
Total Bilirubin: 0.5 mg/dL (ref 0.3–1.2)
Total Protein: 5.7 g/dL — ABNORMAL LOW (ref 6.5–8.1)

## 2015-05-16 LAB — HEPATITIS B SURFACE ANTIBODY,QUALITATIVE: HEP B S AB: NONREACTIVE

## 2015-05-16 LAB — HEPARIN LEVEL (UNFRACTIONATED)

## 2015-05-16 LAB — HEPATITIS C ANTIBODY (REFLEX): HCV Ab: <0.1 s/co ratio (ref 0.0–0.9)

## 2015-05-16 LAB — HEPATITIS B CORE ANTIBODY, IGM: Hep B C IgM: NEGATIVE

## 2015-05-16 LAB — PARATHYROID HORMONE, INTACT (NO CA): PTH: 159 pg/mL — ABNORMAL HIGH (ref 15–65)

## 2015-05-16 LAB — PHOSPHORUS: Phosphorus: 7.1 mg/dL — ABNORMAL HIGH (ref 2.5–4.6)

## 2015-05-16 LAB — PROTIME-INR
INR: 1.51 — ABNORMAL HIGH (ref 0.00–1.49)
Prothrombin Time: 18.2 seconds — ABNORMAL HIGH (ref 11.6–15.2)

## 2015-05-16 LAB — HCV COMMENT:

## 2015-05-16 MED ORDER — PHYTONADIONE 5 MG PO TABS
5.0000 mg | ORAL_TABLET | Freq: Once | ORAL | Status: AC
  Administered 2015-05-16: 5 mg via ORAL
  Filled 2015-05-16: qty 1

## 2015-05-16 MED ORDER — PREDNISONE 20 MG PO TABS
40.0000 mg | ORAL_TABLET | Freq: Every day | ORAL | Status: DC
  Administered 2015-05-17 – 2015-05-19 (×3): 40 mg via ORAL
  Filled 2015-05-16 (×3): qty 2

## 2015-05-16 MED ORDER — CALCITRIOL 0.25 MCG PO CAPS
0.2500 ug | ORAL_CAPSULE | Freq: Every day | ORAL | Status: DC
  Administered 2015-05-16 – 2015-05-26 (×10): 0.25 ug via ORAL
  Filled 2015-05-16 (×10): qty 1

## 2015-05-16 MED ORDER — DARBEPOETIN ALFA 200 MCG/0.4ML IJ SOSY
200.0000 ug | PREFILLED_SYRINGE | INTRAMUSCULAR | Status: DC
Start: 2015-05-16 — End: 2015-05-22
  Administered 2015-05-16: 200 ug via SUBCUTANEOUS
  Filled 2015-05-16: qty 0.4

## 2015-05-16 MED ORDER — ENOXAPARIN SODIUM 100 MG/ML ~~LOC~~ SOLN
100.0000 mg | SUBCUTANEOUS | Status: DC
  Administered 2015-05-16: 100 mg via SUBCUTANEOUS
  Filled 2015-05-16: qty 1

## 2015-05-16 MED ORDER — SODIUM CHLORIDE 0.9 % IV SOLN
510.0000 mg | Freq: Once | INTRAVENOUS | Status: AC
  Administered 2015-05-16: 510 mg via INTRAVENOUS
  Filled 2015-05-16 (×2): qty 17

## 2015-05-16 NOTE — Progress Notes (Signed)
PROGRESS NOTE  Terry [name] [id number] DOB: 1934-12-07 DOA: 05/13/2015 PCP: [name], DO  Brief history 80 year old male with a history of COPD, hypertension, CAD/CABG, CKD stage IV, atrial fibrillation diagnosed October 2015 on apixaban presented with one-week history of worsening cough and shortness of breath. The patient was seen by his primary care provider 4 days prior to admission and prescribed Augmentin and prednisone without improvement. He presented to the emergency department where he was noted to have oxygen saturation of 80% on room air and found to be in respiratory distress. The patient was started on doxycycline and Solu-Medrol. The patient has also been describing symptoms of orthopnea and PND with increasing abdominal girth and lower extremity edema for the past week prior to admission. After admission, the patient was started on intravenous steroids. His breathing did not improve. He subsequently started on intravenous furosemide for volume excess. Assessment/Plan: Acute respiratory failure with hypoxia -Secondary to CHF and COPD -Presently stable on 4 L nasal cannula -Wean oxygen for saturation greater than 92% -Pulmonary hygiene Acute diastolic CHF -Patient is approximate 5 pounds over his dry weight -remains clinically volume overloaded  -Continue intravenous furosemide -Echocardiogram -Daily weights--question accuracy--up 13 pounds over 24 hours -Dry weight is approximately 203 pounds -put out 1900cc with furosemide Acute on chronic renal failure (CKD 4-5) -Baseline creatinine 3.2-3.4 -Appreciate nephrology-->now plans for perm-cath -Previously scheduled for fistula creation 05/21/2015 COPD exacerbation  -Patient has >60 pk years -wean prednisone -Pulmonary hygiene  -Continue bronchodilators --pt intermittenly refusing -Wean oxygen for saturation greater than 92%  Atrial fibrillation  -CHADS-VASc = 6 -Discontinue apixaban due to  worsening renal function -d/c coumadin and switch to lovenox per renal recommendations -restart coumadin when procedures are done -Continue metoprolol tartrate -05/16/15--vitamin K given 5mg  Hypertension -Continue amlodipine, metoprolol tartrate, Cardura Diabetes mellitus type 2 -Check hemoglobin A1c -NovoLog sliding scale Coronary artery disease -Cardiac cath 11/30/1997. Mild distal Left main stenosis, 25% proximal LAD, minor irregularities Circumflex, 95% mid RCA stenosis treated with 3.5 x 32 mm bare metal stent -no angina presently Hyponatremia -Secondary to worsening renal function and fluid overload  Family Communication: Pt at beside Disposition Plan: Expect at least 2-3 more days prior to d/c home   Procedures/Studies: Dg Chest 2 View  05/13/2015  CLINICAL DATA:  80 year old with 1 week history of productive cough and severe shortness of breath. Former smoker with current history of COPD. EXAM: CHEST  2 VIEW COMPARISON:  05/28/2014 and earlier. FINDINGS: AP erect and lateral imaging was performed. Cardiac silhouette mildly enlarged with a right coronary artery stent. Thoracic aorta atherosclerotic, unchanged. Prominent central pulmonary arteries, unchanged. Scarring in the lower lobes, right greater than left, unchanged. Hyperinflation, emphysematous changes in the upper lobes, and mild central peribronchial thickening, unchanged. No new pulmonary parenchymal abnormalities. Degenerative disc disease and spondylosis involving the thoracic spine. IMPRESSION: COPD/emphysema. Scarring in the lower lobes, right greater than left. No acute cardiopulmonary disease. Stable examination. Electronically Signed   By: Evangeline Dakin M.D.   On: 05/13/2015 21:03   US Renal  05/15/2015  CLINICAL DATA:  Acute renal injury EXAM: RENAL / URINARY TRACT ULTRASOUND COMPLETE COMPARISON:  10/24/2014 FINDINGS: Right Kidney: Length: 14.8 cm. Multiple cysts are noted. The largest of these measures 2.2  cm in greatest dimension. Left Kidney: Length: 14.0 cm. Multiple cysts are noted. The largest of these measures 3.1 cm in greatest dimension. Bladder: Appears normal for degree of bladder distention. IMPRESSION: Bilateral renal  cysts similar to that seen on prior MRI examination. No acute obstructive changes are noted. Electronically Signed   By: Inez Catalina M.D.   On: 05/15/2015 16:29         Subjective: Patient continues complaining of nonproductive cough. He states that his breathing overall is low but better. Denies any fevers, chills, chest pain, nausea, vomiting, diarrhea, abdominal pain. No dysuria or hematuria. No rashes.  Objective: Filed Vitals:   05/15/15 2104 05/15/15 2107 05/15/15 2220 05/16/15 0623  BP:   139/59 150/60  Pulse:   68 54  Temp:   97.6 F (36.4 C) 97.8 F (36.6 C)  TempSrc:    Tympanic  Resp:   20 20  Height:      Weight:    100.608 kg (221 lb 12.8 oz)  SpO2: 94% 94% 94% 94%    Intake/Output Summary (Last 24 hours) at 05/16/15 1332 Last data filed at 05/16/15 1148  Gross per 24 hour  Intake    691 ml  Output   2400 ml  Net  -1709 ml   Weight change:  Exam:   General:  Pt is alert, follows commands appropriately, not in acute distress  HEENT: No icterus, No thrush, No neck mass, [place]/AT  Cardiovascular: RRR, S1/S2, no rubs, no gallops  Respiratory: Bibasilar crackles with diminished breath sounds bilateral. No wheezing. Good air movement.  Abdomen: Soft/+BS, non tender, non distended, no guarding; no hepatosplenomegaly.  Extremities: 2+ LE edema, No lymphangitis, No petechiae, No rashes, no synovitis  Data Reviewed: Basic Metabolic Panel:  Recent Labs Lab 05/13/15 2017 05/14/15 0338 05/15/15 0017 05/16/15 0248  NA 130* 128* 129* 128*  K 4.0 4.0 4.3 3.9  CL 98* 97* 97* 95*  CO2 22 19* 21* 20*  GLUCOSE 162* 209* 193* 185*  BUN 76* 82* 93* 109*  CREATININE 4.52* 4.78* 5.17* 5.51*  CALCIUM 7.9* 7.9* 8.0* 7.8*  MG  --   --   --   1.9  PHOS  --   --   --  7.1*   Liver Function Tests:  Recent Labs Lab 05/16/15 0248  AST 18  ALT 22  ALKPHOS 38  BILITOT 0.5  PROT 5.7*  ALBUMIN 3.0*   No results for input(s): LIPASE, AMYLASE in the last 168 hours. No results for input(s): AMMONIA in the last 168 hours. CBC:  Recent Labs Lab 05/13/15 2017 05/14/15 0338 05/15/15 0017 05/16/15 0248  WBC 10.1 11.7* 12.3* 13.7*  NEUTROABS  --   --  11.5*  --   HGB 9.2* 9.1* 8.8* 8.6*  HCT 27.2* 26.8* 25.4* 25.0*  MCV 87.2 86.7 87.0 86.5  PLT 209 216 205 218   Cardiac Enzymes:  Recent Labs Lab 05/14/15 0338  TROPONINI <0.03   BNP: Invalid input(s): POCBNP CBG:  Recent Labs Lab 05/15/15 0754 05/15/15 1154 05/15/15 1631 05/16/15 0801 05/16/15 1243  GLUCAP 183* 226* 158* 146* 183*    Recent Results (from the past 240 hour(s))  MRSA PCR Screening     Status: Abnormal   Collection Time: 05/14/15  7:18 AM  Result Value Ref Range Status   MRSA by PCR POSITIVE (A) NEGATIVE Final    Comment:        The GeneXpert MRSA Assay (FDA approved for NASAL specimens only), is one component of a comprehensive MRSA colonization surveillance program. It is not intended to diagnose MRSA infection nor to guide or monitor treatment for MRSA infections. RESULT CALLED TO, READ BACK BY AND VERIFIED WITH: H.  MILLS RN 9:55 05/14/15 ([email or web address])      Scheduled Meds: . amLODipine  5 mg Oral QHS  . atorvastatin  20 mg Oral q1800  . budesonide-formoterol  2 puff Inhalation BID  . calcitRIOL  0.25 mcg Oral Daily  . Chlorhexidine Gluconate Cloth  6 each Topical Q0600  . darbepoetin (ARANESP) injection - NON-DIALYSIS  200 mcg Subcutaneous Q Thu-1800  . doxazosin  4 mg Oral Daily  . doxycycline  100 mg Oral Q12H  . enoxaparin (LOVENOX) injection  100 mg Subcutaneous Q24H  . ferumoxytol  510 mg Intravenous Once  . furosemide  160 mg Intravenous Q6H  . insulin aspart  0-15 Units Subcutaneous TID WC  . insulin aspart  0-5 Units  Subcutaneous QHS  . ipratropium-albuterol  3 mL Nebulization 3 times per day  . metoprolol  100 mg Oral BID  . mupirocin ointment  1 application Nasal BID  . predniSONE  60 mg Oral Q breakfast  . sodium chloride  500 mL Intravenous Once  . sodium chloride  3 mL Intravenous Q12H  . tiotropium  18 mcg Inhalation Daily   Continuous Infusions:    Macoy Rodwell, DO  [facility] Pager [phone]  If 7PM-7AM, please contact night-coverage [email or web address] Password [email or web address] 05/16/2015, 1:32 PM   LOS: 2 days

## 2015-05-16 NOTE — Progress Notes (Signed)
Subjective: Interval History: has complaints still some breathing issues.  Objective: Vital signs in last 24 hours: Temp:  [97.5 F (36.4 C)-97.8 F (36.6 C)] 97.8 F (36.6 C) (11/24 0623) Pulse Rate:  [54-110] 54 (11/24 0623) Resp:  [20-25] 20 (11/24 0623) BP: (139-150)/(57-60) 150/60 mmHg (11/24 0623) SpO2:  [94 %] 94 % (11/24 0623) Weight:  [99.292 kg (218 lb 14.4 oz)-100.608 kg (221 lb 12.8 oz)] 100.608 kg (221 lb 12.8 oz) (11/24 [id number]) Weight change:   Intake/Output from previous day: 11/23 0701 - 11/24 0700 In: 661 [P.O.:460; I.V.:3; IV Piggyback:198] Out: 1900 [Urine:1900] Intake/Output this shift: Total I/O In: 90 [P.O.:90] Out: -   General appearance: alert, cooperative, moderately obese and pale Resp: diminished breath sounds bilaterally and rales bibasilar Cardio: S1, S2 normal and systolic murmur: holosystolic 2/6, blowing at apex GI: pos bs, soft, liver down 6 cm distended Extremities: edema 4+  Lab Results:  Recent Labs  05/15/15 0017 05/16/15 0248  WBC 12.3* 13.7*  HGB 8.8* 8.6*  HCT 25.4* 25.0*  PLT 205 218   BMET:  Recent Labs  05/15/15 0017 05/16/15 0248  NA 129* 128*  K 4.3 3.9  CL 97* 95*  CO2 21* 20*  GLUCOSE 193* 185*  BUN 93* 109*  CREATININE 5.17* 5.51*  CALCIUM 8.0* 7.8*    Recent Labs  05/15/15 1931  PTH 159*   Iron Studies:  Recent Labs  05/15/15 1931  IRON 67  TIBC 297    Studies/Results: US Renal  05/15/2015  CLINICAL DATA:  Acute renal injury EXAM: RENAL / URINARY TRACT ULTRASOUND COMPLETE COMPARISON:  10/24/2014 FINDINGS: Right Kidney: Length: 14.8 cm. Multiple cysts are noted. The largest of these measures 2.2 cm in greatest dimension. Left Kidney: Length: 14.0 cm. Multiple cysts are noted. The largest of these measures 3.1 cm in greatest dimension. Bladder: Appears normal for degree of bladder distention. IMPRESSION: Bilateral renal cysts similar to that seen on prior MRI examination. No acute obstructive changes  are noted. Electronically Signed   By: Inez Catalina M.D.   On: 05/15/2015 16:29    I have reviewed the patient's current medications.  Assessment/Plan: 1 CKD 4-5 with AKI due to cardiorenal picture.  Diuresing but Cr rising.  If cont will need HD in 2-3 d.  Discussed.  Will neutralize coumadin 2 Anemia needs esa and Fe 3 hPHT vit D 4 COPD 5 CHF as above 6 CAD P cont diuretic, start vit D,esa, Fe, anticipate PC tomorrow.  Try to cont with perm access plans on Tues    LOS: 2 days   Terry [name],Terry [name] 05/16/2015,10:43 AM

## 2015-05-16 NOTE — Progress Notes (Signed)
ANTICOAGULATION CONSULT NOTE - Follow Up Consult  Pharmacy Consult for heparin Indication: atrial fibrillation   Labs:  Recent Labs  05/14/15 0338 05/15/15 0017 05/15/15 1220 05/15/15 1931 05/16/15 0248  HGB 9.1* 8.8*  --   --  8.6*  HCT 26.8* 25.4*  --   --  25.0*  PLT 216 205  --   --  218  APTT  --   --  32 32 33  LABPROT  --   --  19.3*  --  18.2*  INR  --   --  1.63*  --  1.51*  HEPARINUNFRC  --   --  >2.00*  --   --   CREATININE 4.78* 5.17*  --   --  5.51*  TROPONINI <0.03  --   --   --   --      Assessment: 80yo male remains subtherapeutic on heparin with very little change in PTT after rate increase.  Goal of Therapy:  aPTT 66-102 seconds   Plan:  Will increase heparin gtt by 4 units/kg/hr to 1900 units/hr and check PTT in [place], PharmD, BCPS  05/16/2015,4:31 AM

## 2015-05-16 NOTE — Progress Notes (Signed)
ANTICOAGULATION CONSULT NOTE - Initial Consult  Pharmacy Consult for Lovenox  Indication: atrial fibrillation  Allergies  Allergen Reactions  . Levaquin [Levofloxacin In D5w] Itching  . Yellow Dyes (Non-Tartrazine) Other (See Comments)    Burns arms    Patient Measurements: Height: 5\' 8"  (172.7 cm) Weight: 221 lb 12.8 oz (100.608 kg) IBW/kg (Calculated) : 68.4  Vital Signs: Temp: 97.8 F (36.6 C) (11/24 0623) Temp Source: Tympanic (11/24 0623) BP: 150/60 mmHg (11/24 0623) Pulse Rate: 54 (11/24 0623)  Labs:  Recent Labs  05/14/15 0338 05/15/15 0017 05/15/15 1220 05/15/15 1931 05/16/15 0248  HGB 9.1* 8.8*  --   --  8.6*  HCT 26.8* 25.4*  --   --  25.0*  PLT 216 205  --   --  218  APTT  --   --  32 32 33  LABPROT  --   --  19.3*  --  18.2*  INR  --   --  1.63*  --  1.51*  HEPARINUNFRC  --   --  >2.00*  --  >2.20*  CREATININE 4.78* 5.17*  --   --  5.51*  TROPONINI <0.03  --   --   --   --     Estimated Creatinine Clearance: 12.7 mL/min (by C-G formula based on Cr of 5.51).   Medical History: Past Medical History  Diagnosis Date  . Stroke ([place]) 2012  . High cholesterol   . Hypertension   . Kidney disease     CKD4, sees [place] [facility], considering dialysis  . COPD (chronic obstructive pulmonary disease) ([place])   . CAD (coronary artery disease) [place], [name]  . Acute and chronic respiratory failure 10/22/2012  . Osteoarthritis 10/22/2012  . Pneumonia April 2014  . Atrial fibrillation [facility])     Assessment: Pharmacy consulted to dose treatment dose lovenox for afib. Pt was on apixiban PTA and was switched to warfarin and heparin IV bridge inpatient. Warfarin and heparin have been stopped in anticipation of a permacath insertion.   Hgb 8.6, PLT 218   Goal of Therapy:  Monitor platelets by anticoagulation protocol: Yes   Plan:  Discontinue Heparin and Warfarin Lovenox 100mg  [place] Q24h  Recommend checking a level at ss F/U anticoagulation after  permacath insertion Monitor for s/s of bleeding    Zilla Shartzer C. Lennox Grumbles, PharmD Pharmacy Resident  Pager: [phone] 05/16/2015 11:24 AM

## 2015-05-16 NOTE — Progress Notes (Signed)
  Echocardiogram 2D Echocardiogram has been performed.  Jennette Dubin 05/16/2015, 2:25 PM

## 2015-05-17 ENCOUNTER — Inpatient Hospital Stay (HOSPITAL_COMMUNITY): Payer: Medicare PPO

## 2015-05-17 DIAGNOSIS — N186 End stage renal disease: Secondary | ICD-10-CM | POA: Insufficient documentation

## 2015-05-17 LAB — CBC
HCT: 25.1 % — ABNORMAL LOW (ref 39.0–52.0)
HEMATOCRIT: 26.1 % — AB (ref 39.0–52.0)
HEMOGLOBIN: 8.9 g/dL — AB (ref 13.0–17.0)
Hemoglobin: 8.7 g/dL — ABNORMAL LOW (ref 13.0–17.0)
MCH: 29.4 pg (ref 26.0–34.0)
MCH: 29.9 pg (ref 26.0–34.0)
MCHC: 34.1 g/dL (ref 30.0–36.0)
MCHC: 34.7 g/dL (ref 30.0–36.0)
MCV: 86.1 fL (ref 78.0–100.0)
MCV: 86.3 fL (ref 78.0–100.0)
PLATELETS: 228 10*3/uL (ref 150–400)
Platelets: 224 10*3/uL (ref 150–400)
RBC: 3.03 MIL/uL — AB (ref 4.22–5.81)
RBC: 2.91 MIL/uL — ABNORMAL LOW (ref 4.22–5.81)
RDW: 13.3 % (ref 11.5–15.5)
RDW: 13.4 % (ref 11.5–15.5)
WBC: 12.9 10*3/uL — ABNORMAL HIGH (ref 4.0–10.5)
WBC: 12.9 10*3/uL — AB (ref 4.0–10.5)

## 2015-05-17 LAB — RENAL FUNCTION PANEL
ANION GAP: 14 (ref 5–15)
Albumin: 3.1 g/dL — ABNORMAL LOW (ref 3.5–5.0)
Albumin: 3 g/dL — ABNORMAL LOW (ref 3.5–5.0)
Anion gap: 14 (ref 5–15)
BUN: 121 mg/dL — ABNORMAL HIGH (ref 6–20)
BUN: 127 mg/dL — AB (ref 6–20)
CALCIUM: 7.6 mg/dL — AB (ref 8.9–10.3)
CHLORIDE: 89 mmol/L — AB (ref 101–111)
CO2: 20 mmol/L — AB (ref 22–32)
CO2: 21 mmol/L — AB (ref 22–32)
CREATININE: 5.99 mg/dL — AB (ref 0.61–1.24)
Calcium: 7.3 mg/dL — ABNORMAL LOW (ref 8.9–10.3)
Chloride: 93 mmol/L — ABNORMAL LOW (ref 101–111)
Creatinine, Ser: 5.88 mg/dL — ABNORMAL HIGH (ref 0.61–1.24)
GFR calc Af Amer: 9 mL/min — ABNORMAL LOW (ref >60)
GFR calc non Af Amer: 8 mL/min — ABNORMAL LOW (ref >60)
GFR, EST AFRICAN AMERICAN: 10 mL/min — AB (ref >60)
GFR, EST NON AFRICAN AMERICAN: 8 mL/min — AB (ref >60)
Glucose, Bld: 160 mg/dL — ABNORMAL HIGH (ref 65–99)
Glucose, Bld: 155 mg/dL — ABNORMAL HIGH (ref 65–99)
PHOSPHORUS: 8.2 mg/dL — AB (ref 2.5–4.6)
POTASSIUM: 3.4 mmol/L — AB (ref 3.5–5.1)
Phosphorus: 8.2 mg/dL — ABNORMAL HIGH (ref 2.5–4.6)
Potassium: 3.2 mmol/L — ABNORMAL LOW (ref 3.5–5.1)
SODIUM: 127 mmol/L — AB (ref 135–145)
Sodium: 124 mmol/L — ABNORMAL LOW (ref 135–145)

## 2015-05-17 LAB — PROTIME-INR
INR: 1.43 (ref 0.00–1.49)
Prothrombin Time: 17.5 seconds — ABNORMAL HIGH (ref 11.6–15.2)

## 2015-05-17 LAB — HEPARIN LEVEL (UNFRACTIONATED): Heparin Unfractionated: 2.18 IU/mL — ABNORMAL HIGH (ref 0.30–0.70)

## 2015-05-17 LAB — GLUCOSE, CAPILLARY
GLUCOSE-CAPILLARY: 114 mg/dL — AB (ref 65–99)
GLUCOSE-CAPILLARY: 153 mg/dL — AB (ref 65–99)
Glucose-Capillary: 168 mg/dL — ABNORMAL HIGH (ref 65–99)

## 2015-05-17 MED ORDER — PENTAFLUOROPROP-TETRAFLUOROETH EX AERO: 1.0000 "application " | INHALATION_SPRAY | CUTANEOUS | Status: DC | PRN

## 2015-05-17 MED ORDER — CEFAZOLIN SODIUM-DEXTROSE 2-3 GM-% IV SOLR
INTRAVENOUS | Status: AC
  Administered 2015-05-17: 2000 mg via INTRAVENOUS
  Filled 2015-05-17: qty 50

## 2015-05-17 MED ORDER — SODIUM CHLORIDE 0.9 % IV SOLN: 100.0000 mL | INTRAVENOUS | Status: DC | PRN

## 2015-05-17 MED ORDER — ALTEPLASE 2 MG IJ SOLR
2.0000 mg | Freq: Once | INTRAMUSCULAR | Status: DC | PRN
  Filled 2015-05-17: qty 2

## 2015-05-17 MED ORDER — GELATIN ABSORBABLE 12-7 MM EX MISC
CUTANEOUS | Status: AC
  Filled 2015-05-17: qty 1

## 2015-05-17 MED ORDER — DEXTROMETHORPHAN POLISTIREX ER 30 MG/5ML PO SUER
15.0000 mg | Freq: Every evening | ORAL | Status: DC | PRN
  Administered 2015-05-18 (×2): 15 mg via ORAL
  Filled 2015-05-17 (×4): qty 5

## 2015-05-17 MED ORDER — HEPARIN SODIUM (PORCINE) 1000 UNIT/ML DIALYSIS: 40.0000 [IU]/kg | Freq: Once | INTRAMUSCULAR | Status: DC

## 2015-05-17 MED ORDER — LIDOCAINE HCL (PF) 1 % IJ SOLN: 5.0000 mL | INTRAMUSCULAR | Status: DC | PRN

## 2015-05-17 MED ORDER — CEFAZOLIN SODIUM-DEXTROSE 2-3 GM-% IV SOLR
2.0000 g | Freq: Once | INTRAVENOUS | Status: AC
  Administered 2015-05-21: 2 g via INTRAVENOUS
  Filled 2015-05-17 (×2): qty 50

## 2015-05-17 MED ORDER — HEPARIN SODIUM (PORCINE) 1000 UNIT/ML DIALYSIS: 1000.0000 [IU] | INTRAMUSCULAR | Status: DC | PRN

## 2015-05-17 MED ORDER — HEPARIN SODIUM (PORCINE) 1000 UNIT/ML IJ SOLN
INTRAMUSCULAR | Status: AC
  Filled 2015-05-17: qty 1

## 2015-05-17 MED ORDER — FENTANYL CITRATE (PF) 100 MCG/2ML IJ SOLN
INTRAMUSCULAR | Status: AC
  Filled 2015-05-17: qty 2

## 2015-05-17 MED ORDER — FUROSEMIDE 80 MG PO TABS
160.0000 mg | ORAL_TABLET | Freq: Three times a day (TID) | ORAL | Status: DC
  Administered 2015-05-17 – 2015-05-20 (×10): 160 mg via ORAL
  Filled 2015-05-17: qty 2
  Filled 2015-05-17: qty 4
  Filled 2015-05-17 (×8): qty 2

## 2015-05-17 MED ORDER — LIDOCAINE-PRILOCAINE 2.5-2.5 % EX CREA: 1.0000 "application " | TOPICAL_CREAM | CUTANEOUS | Status: DC | PRN

## 2015-05-17 MED ORDER — LIDOCAINE-PRILOCAINE 2.5-2.5 % EX CREA
1.0000 "application " | TOPICAL_CREAM | CUTANEOUS | Status: DC | PRN
  Filled 2015-05-17: qty 5

## 2015-05-17 MED ORDER — MIDAZOLAM HCL 2 MG/2ML IJ SOLN
INTRAMUSCULAR | Status: AC | PRN
Start: 2015-05-17 — End: 2015-05-17
  Administered 2015-05-17: 0.5 mg via INTRAVENOUS

## 2015-05-17 MED ORDER — IPRATROPIUM-ALBUTEROL 0.5-2.5 (3) MG/3ML IN SOLN
3.0000 mL | Freq: Three times a day (TID) | RESPIRATORY_TRACT | Status: DC
  Administered 2015-05-17 – 2015-06-04 (×44): 3 mL via RESPIRATORY_TRACT
  Filled 2015-05-17 (×54): qty 3

## 2015-05-17 MED ORDER — ALTEPLASE 2 MG IJ SOLR: 2.0000 mg | Freq: Once | INTRAMUSCULAR | Status: DC | PRN

## 2015-05-17 MED ORDER — FENTANYL CITRATE (PF) 100 MCG/2ML IJ SOLN
INTRAMUSCULAR | Status: AC | PRN
  Administered 2015-05-17: 25 ug via INTRAVENOUS

## 2015-05-17 MED ORDER — LIDOCAINE HCL 1 % IJ SOLN
INTRAMUSCULAR | Status: AC
  Filled 2015-05-17: qty 20

## 2015-05-17 MED ORDER — MIDAZOLAM HCL 2 MG/2ML IJ SOLN
INTRAMUSCULAR | Status: AC
  Filled 2015-05-17: qty 2

## 2015-05-17 MED ORDER — ENOXAPARIN SODIUM 100 MG/ML ~~LOC~~ SOLN
100.0000 mg | SUBCUTANEOUS | Status: DC
  Administered 2015-05-18: 100 mg via SUBCUTANEOUS
  Filled 2015-05-17: qty 1

## 2015-05-17 NOTE — Sedation Documentation (Signed)
Patient is resting comfortably. 

## 2015-05-17 NOTE — Progress Notes (Signed)
PROGRESS NOTE  Terry [name] [id number] DOB: [birth year] DOA: 05/13/2015 PCP: [name], DO  Brief history 80 year old male with a history of COPD, hypertension, CAD/CABG, CKD stage IV, atrial fibrillation diagnosed October 2015 on apixaban presented with one-week history of worsening cough and shortness of breath. The patient was seen by his primary care provider 4 days prior to admission and prescribed Augmentin and prednisone without improvement. He presented to the emergency department where he was noted to have oxygen saturation of 80% on room air and found to be in respiratory distress. The patient was started on doxycycline and Solu-Medrol. The patient has also been describing symptoms of orthopnea and PND with increasing abdominal girth and lower extremity edema for the past week prior to admission. After admission, the patient was started on intravenous steroids. His breathing did not improve. He subsequently started on intravenous furosemide for volume excess. Assessment/Plan: Acute respiratory failure with hypoxia -Secondary to CHF and COPD -Presently stable on 3 L nasal cannula -Wean oxygen for saturation greater than 92% -Pulmonary hygiene Acute diastolic CHF  -remains clinically volume overloaded  -Continue intravenous furosemide -Echocardiogram -Daily weights--question accuracy--up 13 pounds over 24 hours -Dry weight is approximately 203 pounds -put out 3.4L with furosemide Acute on chronic renal failure (CKD 4-5) -Baseline creatinine 3.2-3.4 -Appreciate nephrology-->plans for perm-cath -Previously scheduled for fistula creation 05/21/2015 -05/17/2015--PermCath placed by radiology COPD exacerbation  -Patient has >60 pk years -wean prednisone -Pulmonary hygiene  -Continue bronchodilators --pt intermittenly refusing -Wean oxygen for saturation greater than 92%  Atrial fibrillation  -CHADS-VASc = 6 -Discontinue apixaban due to worsening renal  function -d/c coumadin and switch to lovenox per renal recommendations -restart coumadin when procedures are done--fistula creation planned 11/29 -Continue metoprolol tartrate -05/16/15--vitamin K given 5mg  Hypertension -Continue metoprolol tartrate, Cardura Diabetes mellitus type 2 -Check hemoglobin A1c--pending -NovoLog sliding scale Coronary artery disease -Cardiac cath 11/30/1997. Mild distal Left main stenosis, 25% proximal LAD, minor irregularities Circumflex, 95% mid RCA stenosis treated with 3.5 x 32 mm bare metal stent -no angina presently Hyponatremia -Secondary to worsening renal function and fluid overload  Family Communication: Pt at beside Disposition Plan: CLIP process prior to d/c home     Procedures/Studies: Dg Chest 2 View  05/13/2015  CLINICAL DATA:  80 year old with 1 week history of productive cough and severe shortness of breath. Former smoker with current history of COPD. EXAM: CHEST  2 VIEW COMPARISON:  05/28/2014 and earlier. FINDINGS: AP erect and lateral imaging was performed. Cardiac silhouette mildly enlarged with a right coronary artery stent. Thoracic aorta atherosclerotic, unchanged. Prominent central pulmonary arteries, unchanged. Scarring in the lower lobes, right greater than left, unchanged. Hyperinflation, emphysematous changes in the upper lobes, and mild central peribronchial thickening, unchanged. No new pulmonary parenchymal abnormalities. Degenerative disc disease and spondylosis involving the thoracic spine. IMPRESSION: COPD/emphysema. Scarring in the lower lobes, right greater than left. No acute cardiopulmonary disease. Stable examination. Electronically Signed   By: Evangeline Dakin M.D.   On: 05/13/2015 21:03   US Renal  05/15/2015  CLINICAL DATA:  Acute renal injury EXAM: RENAL / URINARY TRACT ULTRASOUND COMPLETE COMPARISON:  10/24/2014 FINDINGS: Right Kidney: Length: 14.8 cm. Multiple cysts are noted. The largest of these measures 2.2  cm in greatest dimension. Left Kidney: Length: 14.0 cm. Multiple cysts are noted. The largest of these measures 3.1 cm in greatest dimension. Bladder: Appears normal for degree of bladder distention. IMPRESSION: Bilateral renal cysts similar to that seen  on prior MRI examination. No acute obstructive changes are noted. Electronically Signed   By: Inez Catalina M.D.   On: 05/15/2015 16:29   Ir Fluoro Guide Cv Line Right  05/17/2015  INDICATION: Chronic kidney disease with early uremia. Need to start hemodialysis. EXAM: FLUOROSCOPIC AND ULTRASOUND GUIDED PLACEMENT OF A TUNNELED DIALYSIS CATHETER Physician: Stephan Minister. Henn, MD FLUOROSCOPY TIME:  42 seconds, 13.2 mGy MEDICATIONS: 2 g Ancef. As antibiotic prophylaxis, Ancef was ordered pre-procedure and administered intravenously within one hour of incision. 0.5 mg versed, 25 mcg fentanyl. A radiology nurse monitored the patient for moderate sedation. ANESTHESIA/SEDATION: Moderate sedation time: 30 minutes PROCEDURE: Informed consent was obtained for placement of a tunneled dialysis catheter. The patient was placed supine on the interventional table. Ultrasound confirmed a patent right internal jugularvein. Ultrasound images were obtained for documentation. The right side of the neck was prepped and draped in a sterile fashion. The right side of the neck was anesthetized with 1% lidocaine. Maximal barrier sterile technique was utilized including caps, mask, sterile gowns, sterile gloves, sterile drape, hand hygiene and skin antiseptic. A small incision was made with #11 blade scalpel. A 21 gauge needle directed into the right internal jugular vein with ultrasound guidance. A micropuncture dilator set was placed. A 23 cm tip to cuff Palindrome catheter was selected. The skin below the right clavicle was anesthetized and a small incision was made with an #11 blade scalpel. A subcutaneous tunnel was formed to the vein dermatotomy site. The catheter was brought through the  tunnel. The vein dermatotomy site was dilated to accommodate a peel-away sheath. The catheter was placed through the peel-away sheath and directed into the central venous structures. The tip of the catheter was placed at the superior cavoatrial junction with fluoroscopy. Fluoroscopic images were obtained for documentation. Both lumens were found to aspirate and flush well. The proper amount of heparin was flushed in both lumens. The vein dermatotomy site was closed using a single layer of absorbable suture and Dermabond. The catheter was secured to the skin using Prolene suture. FINDINGS: Catheter tip at the superior cavoatrial junction. Estimated blood loss: Minimal COMPLICATIONS: None IMPRESSION: Successful placement of a right jugular tunneled dialysis catheter using ultrasound and fluoroscopic guidance. Electronically Signed   By: Markus Daft M.D.   On: 05/17/2015 16:23   Ir US Guide Vasc Access Right  05/17/2015  INDICATION: Chronic kidney disease with early uremia. Need to start hemodialysis. EXAM: FLUOROSCOPIC AND ULTRASOUND GUIDED PLACEMENT OF A TUNNELED DIALYSIS CATHETER Physician: Stephan Minister. Henn, MD FLUOROSCOPY TIME:  42 seconds, 13.2 mGy MEDICATIONS: 2 g Ancef. As antibiotic prophylaxis, Ancef was ordered pre-procedure and administered intravenously within one hour of incision. 0.5 mg versed, 25 mcg fentanyl. A radiology nurse monitored the patient for moderate sedation. ANESTHESIA/SEDATION: Moderate sedation time: 30 minutes PROCEDURE: Informed consent was obtained for placement of a tunneled dialysis catheter. The patient was placed supine on the interventional table. Ultrasound confirmed a patent right internal jugularvein. Ultrasound images were obtained for documentation. The right side of the neck was prepped and draped in a sterile fashion. The right side of the neck was anesthetized with 1% lidocaine. Maximal barrier sterile technique was utilized including caps, mask, sterile gowns, sterile  gloves, sterile drape, hand hygiene and skin antiseptic. A small incision was made with #11 blade scalpel. A 21 gauge needle directed into the right internal jugular vein with ultrasound guidance. A micropuncture dilator set was placed. A 23 cm tip to cuff Palindrome catheter was  selected. The skin below the right clavicle was anesthetized and a small incision was made with an #11 blade scalpel. A subcutaneous tunnel was formed to the vein dermatotomy site. The catheter was brought through the tunnel. The vein dermatotomy site was dilated to accommodate a peel-away sheath. The catheter was placed through the peel-away sheath and directed into the central venous structures. The tip of the catheter was placed at the superior cavoatrial junction with fluoroscopy. Fluoroscopic images were obtained for documentation. Both lumens were found to aspirate and flush well. The proper amount of heparin was flushed in both lumens. The vein dermatotomy site was closed using a single layer of absorbable suture and Dermabond. The catheter was secured to the skin using Prolene suture. FINDINGS: Catheter tip at the superior cavoatrial junction. Estimated blood loss: Minimal COMPLICATIONS: None IMPRESSION: Successful placement of a right jugular tunneled dialysis catheter using ultrasound and fluoroscopic guidance. Electronically Signed   By: Markus Daft M.D.   On: 05/17/2015 16:23         Subjective: Patient still complains of orthopnea type symptoms and dyspnea with exertion. Denies any fevers, chills, chest pain, nausea, vomiting, diarrhea, abdominal pain. No dysuria.  Objective: Filed Vitals:   05/17/15 1546 05/17/15 1551 05/17/15 1557 05/17/15 1623  BP: 128/71 139/73 144/70 129/66  Pulse: 67 67 71 71  Temp:    98.3 F (36.8 C)  TempSrc:    Oral  Resp: 15 13 22 20   Height:      Weight:      SpO2: 100% 99% 100% 92%    Intake/Output Summary (Last 24 hours) at 05/17/15 1654 Last data filed at 05/17/15 1621   Gross per 24 hour  Intake    350 ml  Output   3500 ml  Net  -3150 ml   Weight change: 0.454 kg (1 lb) Exam:   General:  Pt is alert, follows commands appropriately, not in acute distress  HEENT: No icterus, No thrush, No neck mass, [place]/AT  Cardiovascular: RRR, S1/S2, no rubs, no gallops  Respiratory: Bibasilar crackles. No wheezing. Good air movement  Abdomen: Soft/+BS, non tender, non distended, no guarding; no hepatosplenomegaly  Extremities: 2+ LE edema, No lymphangitis, No petechiae, No rashes, no synovitis; clubbing without cyanosis  Data Reviewed: Basic Metabolic Panel:  Recent Labs Lab 05/13/15 2017 05/14/15 0338 05/15/15 0017 05/16/15 0248 05/17/15 0414  NA 130* 128* 129* 128* 127*  K 4.0 4.0 4.3 3.9 3.4*  CL 98* 97* 97* 95* 93*  CO2 22 19* 21* 20* 20*  GLUCOSE 162* 209* 193* 185* 160*  BUN 76* 82* 93* 109* 121*  CREATININE 4.52* 4.78* 5.17* 5.51* 5.88*  CALCIUM 7.9* 7.9* 8.0* 7.8* 7.6*  MG  --   --   --  1.9  --   PHOS  --   --   --  7.1* 8.2*   Liver Function Tests:  Recent Labs Lab 05/16/15 0248 05/17/15 0414  AST 18  --   ALT 22  --   ALKPHOS 38  --   BILITOT 0.5  --   PROT 5.7*  --   ALBUMIN 3.0* 3.1*   No results for input(s): LIPASE, AMYLASE in the last 168 hours. No results for input(s): AMMONIA in the last 168 hours. CBC:  Recent Labs Lab 05/13/15 2017 05/14/15 0338 05/15/15 0017 05/16/15 0248 05/17/15 0414  WBC 10.1 11.7* 12.3* 13.7* 12.9*  NEUTROABS  --   --  11.5*  --   --   HGB 9.2*  9.1* 8.8* 8.6* 8.9*  HCT 27.2* 26.8* 25.4* 25.0* 26.1*  MCV 87.2 86.7 87.0 86.5 86.1  PLT 209 216 205 218 224   Cardiac Enzymes:  Recent Labs Lab 05/14/15 0338  TROPONINI <0.03   BNP: Invalid input(s): POCBNP CBG:  Recent Labs Lab 05/16/15 1243 05/16/15 1703 05/16/15 2210 05/17/15 0759 05/17/15 1140  GLUCAP 183* 189* 193* 114* 168*    Recent Results (from the past 240 hour(s))  MRSA PCR Screening     Status: Abnormal    Collection Time: 05/14/15  7:18 AM  Result Value Ref Range Status   MRSA by PCR POSITIVE (A) NEGATIVE Final    Comment:        The GeneXpert MRSA Assay (FDA approved for NASAL specimens only), is one component of a comprehensive MRSA colonization surveillance program. It is not intended to diagnose MRSA infection nor to guide or monitor treatment for MRSA infections. RESULT CALLED TO, READ BACK BY AND VERIFIED WITH: H. MILLS RN 9:55 05/14/15 ([email or web address])      Scheduled Meds: . atorvastatin  20 mg Oral q1800  . budesonide-formoterol  2 puff Inhalation BID  . calcitRIOL  0.25 mcg Oral Daily  .  ceFAZolin (ANCEF) IV  2 g Intravenous Once  . Chlorhexidine Gluconate Cloth  6 each Topical Q0600  . darbepoetin (ARANESP) injection - NON-DIALYSIS  200 mcg Subcutaneous Q Thu-1800  . doxazosin  4 mg Oral Daily  . doxycycline  100 mg Oral Q12H  . [START ON 05/18/2015] enoxaparin (LOVENOX) injection  100 mg Subcutaneous Q24H  . fentaNYL      . furosemide  160 mg Oral TID  . gelatin adsorbable      . heparin      . insulin aspart  0-15 Units Subcutaneous TID WC  . insulin aspart  0-5 Units Subcutaneous QHS  . ipratropium-albuterol  3 mL Nebulization TID  . lidocaine      . metoprolol  100 mg Oral BID  . midazolam      . mupirocin ointment  1 application Nasal BID  . predniSONE  40 mg Oral Q breakfast  . sodium chloride  500 mL Intravenous Once  . sodium chloride  3 mL Intravenous Q12H  . tiotropium  18 mcg Inhalation Daily   Continuous Infusions:    Ollie Delano, DO  [facility] Pager [phone]  If 7PM-7AM, please contact night-coverage [email or web address] Password [email or web address] 05/17/2015, 4:54 PM   LOS: 3 days

## 2015-05-17 NOTE — Sedation Documentation (Signed)
Vital signs stable. 

## 2015-05-17 NOTE — Progress Notes (Signed)
Patient back from IR - placement of right jugular Perm Cath. No acute distress noted, no complaints. Dressing on right side of his neck clear, dry, intact. Will continue to monitor.

## 2015-05-17 NOTE — Progress Notes (Signed)
Patient scheduled to go to Dialysis tonight. Report given to nurse Ocie Cornfield.

## 2015-05-17 NOTE — Progress Notes (Signed)
Subjective: Interval History: has complaints , making urine but still SOB.  Objective: Vital signs in last 24 hours: Temp:  [98.2 F (36.8 C)] 98.2 F (36.8 C) (11/25 [id number]) Pulse Rate:  [64-75] 66 (11/25 0610) Resp:  [18-24] 24 (11/25 0608) BP: (95-131)/(57-82) 125/57 mmHg (11/25 0610) SpO2:  [91 %-93 %] 93 % (11/25 0841) Weight:  [99.746 kg (219 lb 14.4 oz)] 99.746 kg (219 lb 14.4 oz) (11/25 0610) Weight change: 0.454 kg (1 lb)  Intake/Output from previous day: 11/24 0701 - 11/25 0700 In: 480 [P.O.:480] Out: 2300 [Urine:2300] Intake/Output this shift: Total I/O In: 130 [P.O.:130] Out: 400 [Urine:400]  General appearance: alert, cooperative, mild distress, moderately obese and pale Resp: diminished breath sounds bilaterally, rales bibasilar and rhonchi bibasilar Cardio: S1, S2 normal and systolic murmur: holosystolic 2/6, blowing at apex GI: obese., liver down 6 cm Extremities: edema 4+  Lab Results:  Recent Labs  05/16/15 0248 05/17/15 0414  WBC 13.7* 12.9*  HGB 8.6* 8.9*  HCT 25.0* 26.1*  PLT 218 224   BMET:  Recent Labs  05/16/15 0248 05/17/15 0414  NA 128* 127*  K 3.9 3.4*  CL 95* 93*  CO2 20* 20*  GLUCOSE 185* 160*  BUN 109* 121*  CREATININE 5.51* 5.88*  CALCIUM 7.8* 7.6*    Recent Labs  05/15/15 1931  PTH 159*   Iron Studies:  Recent Labs  05/15/15 1931  IRON 67  TIBC 297    Studies/Results: US Renal  05/15/2015  CLINICAL DATA:  Acute renal injury EXAM: RENAL / URINARY TRACT ULTRASOUND COMPLETE COMPARISON:  10/24/2014 FINDINGS: Right Kidney: Length: 14.8 cm. Multiple cysts are noted. The largest of these measures 2.2 cm in greatest dimension. Left Kidney: Length: 14.0 cm. Multiple cysts are noted. The largest of these measures 3.1 cm in greatest dimension. Bladder: Appears normal for degree of bladder distention. IMPRESSION: Bilateral renal cysts similar to that seen on prior MRI examination. No acute obstructive changes are noted.  Electronically Signed   By: Inez Catalina M.D.   On: 05/15/2015 16:29    I have reviewed the patient's current medications.  Assessment/Plan: 1 CKD 4 now 5 vol xs , acidemia, early uremia needs to start HD as is worsening daily.  Will get PC and start HD. Counseled 2 Anemia esa/Fe 3 HPTH vit D 4 Obesity 5 OSA 6 CAD 7 Afib rate/anticoag 8 COPD  P PC, Hd, esa, vit D    LOS: 3 days   [place] Gilpatrick,Deo L 05/17/2015,10:02 AM

## 2015-05-17 NOTE — Procedures (Signed)
Placement of right jugular Perm Cath.  Tip at SVC/RA junction and ready to use. Minimal blood loss and no immediate complication.

## 2015-05-17 NOTE — Consult Note (Signed)
Chief Complaint: Patient was seen in consultation today for tunneled hemodialysis catheter placement Chief Complaint  Patient presents with  . Cough  . Shortness of Breath   at the request of Dr Deterding  Referring Physician(s): Dr Deterding  History of Present Illness: Terry [name] is a 80 y.o. male   Pt admitted through ED with COPD exacerbation Was being treated by PMD with Augmentin and Prednisone without relief Noted was increased renal functions Acute/chronic renal failure Was scheduled for fistula creation 05/21/15 Nephrology consulted-- secondary worsening function daily----now suggesting tunneled hemodialysis catheter placement for dialysis to begin asap Scheduled for tunneled catheter placement in IR  Past Medical History  Diagnosis Date  . Stroke ([place]) 2012  . High cholesterol   . Hypertension   . Kidney disease     CKD4, sees [place] [facility], considering dialysis  . COPD (chronic obstructive pulmonary disease) ([place])   . CAD (coronary artery disease) [place], [name]  . Acute and chronic respiratory failure 10/22/2012  . Osteoarthritis 10/22/2012  . Pneumonia April 2014  . Atrial fibrillation [facility])     Past Surgical History  Procedure Laterality Date  . Coronary stent placement  1999    2 stents  . Appendectomy  1984  . Colon surgery  2012    partial colon removed - twisted bowel  . Hernia repair      Right inguinal  . Cystoscopy/retrograde/ureteroscopy Bilateral 04/23/2014    Procedure: CYSTOSCOPY BILATERAL RETROGRADE,  LEFT URETEROSCOPY, LEFT STENT PLACEMENT;  Surgeon: Raynelle Bring, MD;  Location: [facility];  Service: Urology;  Laterality: Bilateral;    Allergies: Levaquin and Yellow dyes (non-tartrazine)  Medications: Prior to Admission medications   Medication Sig Start Date End Date Taking? Authorizing Provider  albuterol (VENTOLIN HFA) 108 (90 BASE) MCG/ACT inhaler Inhale 2 puffs into the lungs every 4 (four) hours as needed for  wheezing or shortness of breath. 05/09/15  Yes [name], DO  amLODipine (NORVASC) 10 MG tablet TAKE ONE TABLET BY MOUTH ONCE DAILY Patient taking differently: TAKE 10 MG BY MOUTH ONCE DAILY 04/15/15  Yes Burnell Blanks, MD  atorvastatin (LIPITOR) 20 MG tablet Take 1 tablet (20 mg total) by mouth daily. Patient taking differently: Take 20 mg by mouth daily at 6 PM.  03/25/15  Yes Burnell Blanks, MD  budesonide-formoterol [facility]) 160-4.5 MCG/ACT inhaler Inhale 2 puffs into the lungs 2 (two) times daily. 11/07/14  Yes Tanda Rockers, MD  cloNIDine (CATAPRES) 0.1 MG tablet Take 0.1 mg by mouth 2 (two) times daily as needed (hypertension).   Yes Historical Provider, MD  doxazosin (CARDURA) 4 MG tablet TAKE ONE TABLET BY MOUTH ONCE DAILY Patient taking differently: TAKE 4 MG BY MOUTH ONCE DAILY 11/15/14  Yes Burnell Blanks, MD  ELIQUIS 5 MG TABS tablet TAKE ONE TABLET BY MOUTH TWICE DAILY Patient taking differently: TAKE 5 MG BY MOUTH TWICE DAILY 04/17/15  Yes Burnell Blanks, MD  metoprolol (LOPRESSOR) 100 MG tablet Take 1 tablet (100 mg total) by mouth 2 (two) times daily. 05/25/14  Yes Burnell Blanks, MD  tiotropium (SPIRIVA HANDIHALER) 18 MCG inhalation capsule INHALE THE CONTENTS OF ONE CAPSULE INTO LUNGS ONCE DAILY Patient taking differently: Place 18 mcg into inhaler and inhale daily.  04/25/15  Yes Tanda Rockers, MD  torsemide (DEMADEX) 20 MG tablet Take 20 mg by mouth daily.  08/16/14  Yes Historical Provider, MD  predniSONE (DELTASONE) 20 MG tablet 40 mg (2  tabs) daily for 2 days, then 20 mg (1 tab) daily for 3 days Patient not taking: Reported on 05/13/2015 05/09/15   [name], DO     Family History  Problem Relation Age of Onset  . Cancer Mother     Breast  . Heart disease Father   . Heart attack Father   . Parkinson's disease Brother     Social History   Social History  . Marital Status: Married    Spouse Name: N/A  . Number of  Children: 3  . Years of Education: N/A   Occupational History  . retired-worked in radio    Social History Main Topics  . Smoking status: Former Smoker -- 1.00 packs/day for 49 years    Types: Cigarettes, Pipe, Cigars    Quit date: 02/21/2000  . Smokeless tobacco: Never Used  . Alcohol Use: No  . Drug Use: No  . Sexual Activity: Not Asked   Other Topics Concern  . None   Social History Narrative   Work or School: retired [employer] Situation: lives with wife in [place]; poor diet              Review of Systems: A 12 point ROS discussed and pertinent positives are indicated in the HPI above.  All other systems are negative.  Review of Systems  Constitutional: Positive for activity change, appetite change and fatigue. Negative for fever.  Respiratory: Positive for cough and shortness of breath.   Cardiovascular: Negative for chest pain.  Gastrointestinal: Negative for nausea.  Neurological: Positive for weakness.  Psychiatric/Behavioral: Negative for behavioral problems and confusion.    Vital Signs: BP 125/57 mmHg  Pulse 66  Temp(Src) 98.2 F (36.8 C) (Oral)  Resp 24  Ht 5\' 8"  (1.727 m)  Wt 219 lb 14.4 oz (99.746 kg)  BMI 33.44 kg/m2  SpO2 93%  Physical Exam  Constitutional: He is oriented to person, place, and time.  Cardiovascular: Normal rate, regular rhythm and normal heart sounds.   Pulmonary/Chest: Effort normal. He has wheezes.  Abdominal: Soft. Bowel sounds are normal. There is no tenderness.  Musculoskeletal: Normal range of motion.  Neurological: He is alert and oriented to person, place, and time.  Skin: Skin is warm and dry.  Psychiatric: He has a normal mood and affect. His behavior is normal. Judgment and thought content normal.  Nursing note and vitals reviewed.   Mallampati Score:  MD Evaluation Airway: WNL Heart: WNL Abdomen: WNL Chest/ Lungs: WNL ASA   Classification: 3 Mallampati/Airway Score: One  Imaging: Dg Chest 2 View  05/13/2015  CLINICAL DATA:  80 year old with 1 week history of productive cough and severe shortness of breath. Former smoker with current history of COPD. EXAM: CHEST  2 VIEW COMPARISON:  05/28/2014 and earlier. FINDINGS: AP erect and lateral imaging was performed. Cardiac silhouette mildly enlarged with a right coronary artery stent. Thoracic aorta atherosclerotic, unchanged. Prominent central pulmonary arteries, unchanged. Scarring in the lower lobes, right greater than left, unchanged. Hyperinflation, emphysematous changes in the upper lobes, and mild central peribronchial thickening, unchanged. No new pulmonary parenchymal abnormalities. Degenerative disc disease and spondylosis involving the thoracic spine. IMPRESSION: COPD/emphysema. Scarring in the lower lobes, right greater than left. No acute cardiopulmonary disease. Stable examination. Electronically Signed   By: Evangeline Dakin M.D.   On: 05/13/2015 21:03   US Renal  05/15/2015  CLINICAL DATA:  Acute renal injury EXAM: RENAL / URINARY TRACT ULTRASOUND COMPLETE COMPARISON:  10/24/2014 FINDINGS: Right Kidney: Length: 14.8 cm. Multiple cysts are noted. The largest of these measures 2.2 cm in greatest dimension. Left Kidney: Length: 14.0 cm. Multiple cysts are noted. The largest of these measures 3.1 cm in greatest dimension. Bladder: Appears normal for degree of bladder distention. IMPRESSION: Bilateral renal cysts similar to that seen on prior MRI examination. No acute obstructive changes are noted. Electronically Signed   By: Inez Catalina M.D.   On: 05/15/2015 16:29    Labs:  CBC:  Recent Labs  05/14/15 0338 05/15/15 0017 05/16/15 0248 05/17/15 0414  WBC 11.7* 12.3* 13.7* 12.9*  HGB 9.1* 8.8* 8.6* 8.9*  HCT 26.8* 25.4* 25.0* 26.1*  PLT 216 205 218 224    COAGS:  Recent Labs  05/15/15 1220 05/15/15 1931 05/16/15 0248 05/17/15 0414  INR 1.63*  --   1.51* 1.43  APTT 32 32 33  --     BMP:  Recent Labs  05/14/15 0338 05/15/15 0017 05/16/15 0248 05/17/15 0414  NA 128* 129* 128* 127*  K 4.0 4.3 3.9 3.4*  CL 97* 97* 95* 93*  CO2 19* 21* 20* 20*  GLUCOSE 209* 193* 185* 160*  BUN 82* 93* 109* 121*  CALCIUM 7.9* 8.0* 7.8* 7.6*  CREATININE 4.78* 5.17* 5.51* 5.88*  GFRNONAA 11* 10* 9* 8*  GFRAA 12* 11* 10* 10*    LIVER FUNCTION TESTS:  Recent Labs  03/20/15 1001 05/16/15 0248 05/17/15 0414  BILITOT 0.3 0.5  --   AST 14 18  --   ALT 13 22  --   ALKPHOS 55 38  --   PROT 6.8 5.7*  --   ALBUMIN 3.8 3.0* 3.1*    TUMOR MARKERS: No results for input(s): AFPTM, CEA, CA199, CHROMGRNA in the last 8760 hours.  Assessment and Plan:  Acute on chronic renal failure Was scheduled for fistula creation 11/29 Admitted through ED with COPD exacerbation and noted worsening renal function Now scheduled for tunneled Hemodialysis catheter placement Risks and Benefits discussed with the patient including, but not limited to bleeding, infection, vascular injury, pneumothorax which may require chest tube placement, air embolism or even death All of the patient's questions were answered, patient is agreeable to proceed. Consent signed and in chart.  [facility] Prednisone afeb Lovenox Held 11/25    Thank you for this interesting consult.  I greatly enjoyed meeting Terry [name] and look forward to participating in their care.  A copy of this report was sent to the requesting provider on this date.  Signed: Yarethzi Branan A 05/17/2015, 10:58 AM   I spent a total of 20 Minutes    in face to face in clinical consultation, greater than 50% of which was counseling/coordinating care for tunneled HD catheter placement

## 2015-05-18 LAB — GLUCOSE, CAPILLARY
GLUCOSE-CAPILLARY: 138 mg/dL — AB (ref 65–99)
GLUCOSE-CAPILLARY: 160 mg/dL — AB (ref 65–99)
GLUCOSE-CAPILLARY: 172 mg/dL — AB (ref 65–99)
Glucose-Capillary: 97 mg/dL (ref 65–99)
Glucose-Capillary: 179 mg/dL — ABNORMAL HIGH (ref 65–99)

## 2015-05-18 LAB — RENAL FUNCTION PANEL
ALBUMIN: 3 g/dL — AB (ref 3.5–5.0)
ANION GAP: 12 (ref 5–15)
BUN: 92 mg/dL — AB (ref 6–20)
CHLORIDE: 91 mmol/L — AB (ref 101–111)
CO2: 24 mmol/L (ref 22–32)
Calcium: 7.5 mg/dL — ABNORMAL LOW (ref 8.9–10.3)
Creatinine, Ser: 4.67 mg/dL — ABNORMAL HIGH (ref 0.61–1.24)
GFR calc Af Amer: 13 mL/min — ABNORMAL LOW (ref >60)
GFR calc non Af Amer: 11 mL/min — ABNORMAL LOW (ref >60)
GLUCOSE: 203 mg/dL — AB (ref 65–99)
PHOSPHORUS: 6.2 mg/dL — AB (ref 2.5–4.6)
POTASSIUM: 3.3 mmol/L — AB (ref 3.5–5.1)
Sodium: 127 mmol/L — ABNORMAL LOW (ref 135–145)

## 2015-05-18 LAB — CBC
HCT: 28.4 % — ABNORMAL LOW (ref 39.0–52.0)
Hemoglobin: 9.8 g/dL — ABNORMAL LOW (ref 13.0–17.0)
MCH: 29.6 pg (ref 26.0–34.0)
MCHC: 34.5 g/dL (ref 30.0–36.0)
MCV: 85.8 fL (ref 78.0–100.0)
PLATELETS: 234 10*3/uL (ref 150–400)
RBC: 3.31 MIL/uL — AB (ref 4.22–5.81)
RDW: 13.5 % (ref 11.5–15.5)
WBC: 15.4 10*3/uL — AB (ref 4.0–10.5)

## 2015-05-18 LAB — PROTIME-INR
INR: 1.23 (ref 0.00–1.49)
PROTHROMBIN TIME: 15.6 s — AB (ref 11.6–15.2)

## 2015-05-18 LAB — HEMOGLOBIN A1C
HEMOGLOBIN A1C: 6.6 % — AB (ref 4.8–5.6)
MEAN PLASMA GLUCOSE: 143 mg/dL

## 2015-05-18 MED ORDER — METOPROLOL TARTRATE 25 MG PO TABS
25.0000 mg | ORAL_TABLET | Freq: Two times a day (BID) | ORAL | Status: DC
  Administered 2015-05-18 – 2015-06-04 (×27): 25 mg via ORAL
  Filled 2015-05-18 (×3): qty 1
  Filled 2015-05-18: qty 2
  Filled 2015-05-18 (×27): qty 1

## 2015-05-18 MED ORDER — CLONAZEPAM 1 MG PO TABS
1.0000 mg | ORAL_TABLET | Freq: Every day | ORAL | Status: AC
Start: 2015-05-18 — End: 2015-05-18
  Administered 2015-05-18: 1 mg via ORAL
  Filled 2015-05-18: qty 1

## 2015-05-18 NOTE — Progress Notes (Signed)
PROGRESS NOTE  Terry [name] [id number] DOB: 04/08/1935 DOA: 05/13/2015 PCP: [name], DO  Brief history 80 year old male with a history of COPD, hypertension, CAD/CABG, CKD stage IV, atrial fibrillation diagnosed October 2015 on apixaban presented with one-week history of worsening cough and shortness of breath. The patient was seen by his primary care provider 4 days prior to admission and prescribed Augmentin and prednisone without improvement. He presented to the emergency department where he was noted to have oxygen saturation of 80% on room air and found to be in respiratory distress. The patient was started on doxycycline and Solu-Medrol. The patient has also been describing symptoms of orthopnea and PND with increasing abdominal girth and lower extremity edema for the past week prior to admission. After admission, the patient was started on intravenous steroids. His breathing did not improve. He subsequently started on intravenous furosemide for volume excess. Assessment/Plan: Acute respiratory failure with hypoxia -Secondary to CHF and COPD -Presently stable on 3 L nasal cannula -Wean oxygen for saturation greater than 92% -Pulmonary hygiene Acute diastolic CHF  -remains clinically volume overloaded  -Continue intravenous furosemide -05/16/2015 Echocardiogram--EF 55-60%, trivial MR/TR -Daily weights--question accuracy -Dry weight is approximately 203 pounds -put out 5.1L with furosemide; 3L pulled off additional with HD Acute on chronic renal failure (CKD 4-5) -Baseline creatinine 3.2-3.4 -Appreciate nephrology-->plans for perm-cath -Previously scheduled for fistula creation 05/21/2015 -05/17/2015--PermCath placed by radiology COPD exacerbation  -Patient has >60 pk years -wean prednisone -Pulmonary hygiene  -Continue bronchodilators --pt intermittenly refusing -Wean oxygen for saturation greater than 92%  Atrial fibrillation  -CHADS-VASc =  6 -Discontinue apixaban due to worsening renal function -d/c coumadin and switch to lovenox per renal recommendations -restart coumadin when procedures are done--fistula creation planned 11/29 -Continue metoprolol tartrate -05/16/15--vitamin K given 5mg  Hypertension -Continue metoprolol tartrate, Cardura Diabetes mellitus type 2 -Check hemoglobin A1c--6.6 -pt agreeable to start po meds upon d/c -NovoLog sliding scale Coronary artery disease -Cardiac cath 11/30/1997. Mild distal Left main stenosis, 25% proximal LAD, minor irregularities Circumflex, 95% mid RCA stenosis treated with 3.5 x 32 mm bare metal stent -no angina presently Hyponatremia -Secondary to worsening renal function and fluid overload  Family Communication: Pt at beside Disposition Plan: CLIP process prior to d/c home   Procedures/Studies: Dg Chest 2 View  05/13/2015  CLINICAL DATA:  80 year old with 1 week history of productive cough and severe shortness of breath. Former smoker with current history of COPD. EXAM: CHEST  2 VIEW COMPARISON:  05/28/2014 and earlier. FINDINGS: AP erect and lateral imaging was performed. Cardiac silhouette mildly enlarged with a right coronary artery stent. Thoracic aorta atherosclerotic, unchanged. Prominent central pulmonary arteries, unchanged. Scarring in the lower lobes, right greater than left, unchanged. Hyperinflation, emphysematous changes in the upper lobes, and mild central peribronchial thickening, unchanged. No new pulmonary parenchymal abnormalities. Degenerative disc disease and spondylosis involving the thoracic spine. IMPRESSION: COPD/emphysema. Scarring in the lower lobes, right greater than left. No acute cardiopulmonary disease. Stable examination. Electronically Signed   By: Evangeline Dakin M.D.   On: 05/13/2015 21:03   US Renal  05/15/2015  CLINICAL DATA:  Acute renal injury EXAM: RENAL / URINARY TRACT ULTRASOUND COMPLETE COMPARISON:  10/24/2014 FINDINGS: Right  Kidney: Length: 14.8 cm. Multiple cysts are noted. The largest of these measures 2.2 cm in greatest dimension. Left Kidney: Length: 14.0 cm. Multiple cysts are noted. The largest of these measures 3.1 cm in greatest dimension. Bladder: Appears normal for degree  of bladder distention. IMPRESSION: Bilateral renal cysts similar to that seen on prior MRI examination. No acute obstructive changes are noted. Electronically Signed   By: Inez Catalina M.D.   On: 05/15/2015 16:29   Ir Fluoro Guide Cv Line Right  05/17/2015  INDICATION: Chronic kidney disease with early uremia. Need to start hemodialysis. EXAM: FLUOROSCOPIC AND ULTRASOUND GUIDED PLACEMENT OF A TUNNELED DIALYSIS CATHETER Physician: Stephan Minister. Henn, MD FLUOROSCOPY TIME:  42 seconds, 13.2 mGy MEDICATIONS: 2 g Ancef. As antibiotic prophylaxis, Ancef was ordered pre-procedure and administered intravenously within one hour of incision. 0.5 mg versed, 25 mcg fentanyl. A radiology nurse monitored the patient for moderate sedation. ANESTHESIA/SEDATION: Moderate sedation time: 30 minutes PROCEDURE: Informed consent was obtained for placement of a tunneled dialysis catheter. The patient was placed supine on the interventional table. Ultrasound confirmed a patent right internal jugularvein. Ultrasound images were obtained for documentation. The right side of the neck was prepped and draped in a sterile fashion. The right side of the neck was anesthetized with 1% lidocaine. Maximal barrier sterile technique was utilized including caps, mask, sterile gowns, sterile gloves, sterile drape, hand hygiene and skin antiseptic. A small incision was made with #11 blade scalpel. A 21 gauge needle directed into the right internal jugular vein with ultrasound guidance. A micropuncture dilator set was placed. A 23 cm tip to cuff Palindrome catheter was selected. The skin below the right clavicle was anesthetized and a small incision was made with an #11 blade scalpel. A subcutaneous  tunnel was formed to the vein dermatotomy site. The catheter was brought through the tunnel. The vein dermatotomy site was dilated to accommodate a peel-away sheath. The catheter was placed through the peel-away sheath and directed into the central venous structures. The tip of the catheter was placed at the superior cavoatrial junction with fluoroscopy. Fluoroscopic images were obtained for documentation. Both lumens were found to aspirate and flush well. The proper amount of heparin was flushed in both lumens. The vein dermatotomy site was closed using a single layer of absorbable suture and Dermabond. The catheter was secured to the skin using Prolene suture. FINDINGS: Catheter tip at the superior cavoatrial junction. Estimated blood loss: Minimal COMPLICATIONS: None IMPRESSION: Successful placement of a right jugular tunneled dialysis catheter using ultrasound and fluoroscopic guidance. Electronically Signed   By: Markus Daft M.D.   On: 05/17/2015 16:23   Ir US Guide Vasc Access Right  05/17/2015  INDICATION: Chronic kidney disease with early uremia. Need to start hemodialysis. EXAM: FLUOROSCOPIC AND ULTRASOUND GUIDED PLACEMENT OF A TUNNELED DIALYSIS CATHETER Physician: Stephan Minister. Henn, MD FLUOROSCOPY TIME:  42 seconds, 13.2 mGy MEDICATIONS: 2 g Ancef. As antibiotic prophylaxis, Ancef was ordered pre-procedure and administered intravenously within one hour of incision. 0.5 mg versed, 25 mcg fentanyl. A radiology nurse monitored the patient for moderate sedation. ANESTHESIA/SEDATION: Moderate sedation time: 30 minutes PROCEDURE: Informed consent was obtained for placement of a tunneled dialysis catheter. The patient was placed supine on the interventional table. Ultrasound confirmed a patent right internal jugularvein. Ultrasound images were obtained for documentation. The right side of the neck was prepped and draped in a sterile fashion. The right side of the neck was anesthetized with 1% lidocaine. Maximal  barrier sterile technique was utilized including caps, mask, sterile gowns, sterile gloves, sterile drape, hand hygiene and skin antiseptic. A small incision was made with #11 blade scalpel. A 21 gauge needle directed into the right internal jugular vein with ultrasound guidance. A micropuncture dilator set  was placed. A 23 cm tip to cuff Palindrome catheter was selected. The skin below the right clavicle was anesthetized and a small incision was made with an #11 blade scalpel. A subcutaneous tunnel was formed to the vein dermatotomy site. The catheter was brought through the tunnel. The vein dermatotomy site was dilated to accommodate a peel-away sheath. The catheter was placed through the peel-away sheath and directed into the central venous structures. The tip of the catheter was placed at the superior cavoatrial junction with fluoroscopy. Fluoroscopic images were obtained for documentation. Both lumens were found to aspirate and flush well. The proper amount of heparin was flushed in both lumens. The vein dermatotomy site was closed using a single layer of absorbable suture and Dermabond. The catheter was secured to the skin using Prolene suture. FINDINGS: Catheter tip at the superior cavoatrial junction. Estimated blood loss: Minimal COMPLICATIONS: None IMPRESSION: Successful placement of a right jugular tunneled dialysis catheter using ultrasound and fluoroscopic guidance. Electronically Signed   By: Markus Daft M.D.   On: 05/17/2015 16:23         Subjective: Patient is breathing better. Denies any fevers, chills, chest pain, nausea, vomiting, diarrhea.  Objective: Filed Vitals:   05/18/15 0734 05/18/15 0739 05/18/15 1341 05/18/15 1526  BP:   123/54   Pulse:   66   Temp:   97.7 F (36.5 C)   TempSrc:   Oral   Resp:   20   Height:      Weight:      SpO2: 94% 94% 91% 92%    Intake/Output Summary (Last 24 hours) at 05/18/15 1630 Last data filed at 05/18/15 1338  Gross per 24 hour   Intake    510 ml  Output   3000 ml  Net  -2490 ml   Weight change: 0.054 kg (1.9 oz) Exam:   General:  Pt is alert, follows commands appropriately, not in acute distress  HEENT: No icterus, No thrush, No neck mass, [place]/AT  Cardiovascular: RRR, S1/S2, no rubs, no gallops  Respiratory: Bibasilar crackles. No wheezing. Good air movement  Abdomen: Soft/+BS, non tender, non distended, no guarding  Extremities: 2+LE edema, No lymphangitis, No petechiae, No rashes, no synovitis  Data Reviewed: Basic Metabolic Panel:  Recent Labs Lab 05/14/15 0338 05/15/15 0017 05/16/15 0248 05/17/15 0414 05/17/15 2156  NA 128* 129* 128* 127* 124*  K 4.0 4.3 3.9 3.4* 3.2*  CL 97* 97* 95* 93* 89*  CO2 19* 21* 20* 20* 21*  GLUCOSE 209* 193* 185* 160* 155*  BUN 82* 93* 109* 121* 127*  CREATININE 4.78* 5.17* 5.51* 5.88* 5.99*  CALCIUM 7.9* 8.0* 7.8* 7.6* 7.3*  MG  --   --  1.9  --   --   PHOS  --   --  7.1* 8.2* 8.2*   Liver Function Tests:  Recent Labs Lab 05/16/15 0248 05/17/15 0414 05/17/15 2156  AST 18  --   --   ALT 22  --   --   ALKPHOS 38  --   --   BILITOT 0.5  --   --   PROT 5.7*  --   --   ALBUMIN 3.0* 3.1* 3.0*   No results for input(s): LIPASE, AMYLASE in the last 168 hours. No results for input(s): AMMONIA in the last 168 hours. CBC:  Recent Labs Lab 05/15/15 0017 05/16/15 0248 05/17/15 0414 05/17/15 2157 05/18/15 0535  WBC 12.3* 13.7* 12.9* 12.9* 15.4*  NEUTROABS 11.5*  --   --   --   --  HGB 8.8* 8.6* 8.9* 8.7* 9.8*  HCT 25.4* 25.0* 26.1* 25.1* 28.4*  MCV 87.0 86.5 86.1 86.3 85.8  PLT 205 218 224 228 234   Cardiac Enzymes:  Recent Labs Lab 05/14/15 0338  TROPONINI <0.03   BNP: Invalid input(s): POCBNP CBG:  Recent Labs Lab 05/17/15 1140 05/17/15 1727 05/18/15 0058 05/18/15 0752 05/18/15 1208  GLUCAP 168* 153* 138* 97 172*    Recent Results (from the past 240 hour(s))  MRSA PCR Screening     Status: Abnormal   Collection Time:  05/14/15  7:18 AM  Result Value Ref Range Status   MRSA by PCR POSITIVE (A) NEGATIVE Final    Comment:        The GeneXpert MRSA Assay (FDA approved for NASAL specimens only), is one component of a comprehensive MRSA colonization surveillance program. It is not intended to diagnose MRSA infection nor to guide or monitor treatment for MRSA infections. RESULT CALLED TO, READ BACK BY AND VERIFIED WITH: H. MILLS RN 9:55 05/14/15 ([email or web address])      Scheduled Meds: . atorvastatin  20 mg Oral q1800  . budesonide-formoterol  2 puff Inhalation BID  . calcitRIOL  0.25 mcg Oral Daily  .  ceFAZolin (ANCEF) IV  2 g Intravenous Once  . Chlorhexidine Gluconate Cloth  6 each Topical Q0600  . clonazePAM  1 mg Oral Daily  . darbepoetin (ARANESP) injection - NON-DIALYSIS  200 mcg Subcutaneous Q Thu-1800  . doxazosin  4 mg Oral Daily  . doxycycline  100 mg Oral Q12H  . enoxaparin (LOVENOX) injection  100 mg Subcutaneous Q24H  . furosemide  160 mg Oral TID  . insulin aspart  0-15 Units Subcutaneous TID WC  . insulin aspart  0-5 Units Subcutaneous QHS  . ipratropium-albuterol  3 mL Nebulization TID  . metoprolol  100 mg Oral BID  . mupirocin ointment  1 application Nasal BID  . predniSONE  40 mg Oral Q breakfast  . sodium chloride  500 mL Intravenous Once  . sodium chloride  3 mL Intravenous Q12H  . tiotropium  18 mcg Inhalation Daily   Continuous Infusions:    Gianny Sabino, DO  [facility] Pager [phone]  If 7PM-7AM, please contact night-coverage [email or web address] Password [email or web address] 05/18/2015, 4:30 PM   LOS: 4 days

## 2015-05-18 NOTE — Progress Notes (Signed)
Subjective: Interval History: has complaints had cramps after HD.  Objective: Vital signs in last 24 hours: Temp:  [97.7 F (36.5 C)-98.6 F (37 C)] 98.6 F (37 C) (11/26 0500) Pulse Rate:  [65-87] 87 (11/26 0500) Resp:  [13-34] 20 (11/26 0500) BP: (91-165)/(50-94) 127/87 mmHg (11/26 0500) SpO2:  [88 %-100 %] 94 % (11/26 0739) FiO2 (%):  [30 %] 30 % (11/25 2252) Weight:  [96.3 kg (212 lb 4.9 oz)-99.8 kg (220 lb 0.3 oz)] 96.3 kg (212 lb 4.9 oz) (11/25 2252) Weight change: 0.054 kg (1.9 oz)  Intake/Output from previous day: 11/25 0701 - 11/26 0700 In: 590 [P.O.:590] Out: 4700 [Urine:1700] Intake/Output this shift:    General appearance: alert, cooperative and no distress Resp: diminished breath sounds bilaterally, rales bibasilar and rhonchi bibasilar Chest wall: RIJ cath Cardio: S1, S2 normal and systolic murmur: holosystolic 2/6, blowing at apex GI: pos bs, soft, obese, liver down 6 cm Extremities: edema 4+  Lab Results:  Recent Labs  05/17/15 2157 05/18/15 0535  WBC 12.9* 15.4*  HGB 8.7* 9.8*  HCT 25.1* 28.4*  PLT 228 234   BMET:  Recent Labs  05/17/15 0414 05/17/15 2156  NA 127* 124*  K 3.4* 3.2*  CL 93* 89*  CO2 20* 21*  GLUCOSE 160* 155*  BUN 121* 127*  CREATININE 5.88* 5.99*  CALCIUM 7.6* 7.3*    Recent Labs  05/15/15 1931  PTH 159*   Iron Studies:  Recent Labs  05/15/15 1931  IRON 67  TIBC 297    Studies/Results: Ir Fluoro Guide Cv Line Right  05/17/2015  INDICATION: Chronic kidney disease with early uremia. Need to start hemodialysis. EXAM: FLUOROSCOPIC AND ULTRASOUND GUIDED PLACEMENT OF A TUNNELED DIALYSIS CATHETER Physician: Stephan Minister. Henn, MD FLUOROSCOPY TIME:  42 seconds, 13.2 mGy MEDICATIONS: 2 g Ancef. As antibiotic prophylaxis, Ancef was ordered pre-procedure and administered intravenously within one hour of incision. 0.5 mg versed, 25 mcg fentanyl. A radiology nurse monitored the patient for moderate sedation. ANESTHESIA/SEDATION:  Moderate sedation time: 30 minutes PROCEDURE: Informed consent was obtained for placement of a tunneled dialysis catheter. The patient was placed supine on the interventional table. Ultrasound confirmed a patent right internal jugularvein. Ultrasound images were obtained for documentation. The right side of the neck was prepped and draped in a sterile fashion. The right side of the neck was anesthetized with 1% lidocaine. Maximal barrier sterile technique was utilized including caps, mask, sterile gowns, sterile gloves, sterile drape, hand hygiene and skin antiseptic. A small incision was made with #11 blade scalpel. A 21 gauge needle directed into the right internal jugular vein with ultrasound guidance. A micropuncture dilator set was placed. A 23 cm tip to cuff Palindrome catheter was selected. The skin below the right clavicle was anesthetized and a small incision was made with an #11 blade scalpel. A subcutaneous tunnel was formed to the vein dermatotomy site. The catheter was brought through the tunnel. The vein dermatotomy site was dilated to accommodate a peel-away sheath. The catheter was placed through the peel-away sheath and directed into the central venous structures. The tip of the catheter was placed at the superior cavoatrial junction with fluoroscopy. Fluoroscopic images were obtained for documentation. Both lumens were found to aspirate and flush well. The proper amount of heparin was flushed in both lumens. The vein dermatotomy site was closed using a single layer of absorbable suture and Dermabond. The catheter was secured to the skin using Prolene suture. FINDINGS: Catheter tip at the superior cavoatrial junction.  Estimated blood loss: Minimal COMPLICATIONS: None IMPRESSION: Successful placement of a right jugular tunneled dialysis catheter using ultrasound and fluoroscopic guidance. Electronically Signed   By: Markus Daft M.D.   On: 05/17/2015 16:23   Ir US Guide Vasc Access  Right  05/17/2015  INDICATION: Chronic kidney disease with early uremia. Need to start hemodialysis. EXAM: FLUOROSCOPIC AND ULTRASOUND GUIDED PLACEMENT OF A TUNNELED DIALYSIS CATHETER Physician: Stephan Minister. Henn, MD FLUOROSCOPY TIME:  42 seconds, 13.2 mGy MEDICATIONS: 2 g Ancef. As antibiotic prophylaxis, Ancef was ordered pre-procedure and administered intravenously within one hour of incision. 0.5 mg versed, 25 mcg fentanyl. A radiology nurse monitored the patient for moderate sedation. ANESTHESIA/SEDATION: Moderate sedation time: 30 minutes PROCEDURE: Informed consent was obtained for placement of a tunneled dialysis catheter. The patient was placed supine on the interventional table. Ultrasound confirmed a patent right internal jugularvein. Ultrasound images were obtained for documentation. The right side of the neck was prepped and draped in a sterile fashion. The right side of the neck was anesthetized with 1% lidocaine. Maximal barrier sterile technique was utilized including caps, mask, sterile gowns, sterile gloves, sterile drape, hand hygiene and skin antiseptic. A small incision was made with #11 blade scalpel. A 21 gauge needle directed into the right internal jugular vein with ultrasound guidance. A micropuncture dilator set was placed. A 23 cm tip to cuff Palindrome catheter was selected. The skin below the right clavicle was anesthetized and a small incision was made with an #11 blade scalpel. A subcutaneous tunnel was formed to the vein dermatotomy site. The catheter was brought through the tunnel. The vein dermatotomy site was dilated to accommodate a peel-away sheath. The catheter was placed through the peel-away sheath and directed into the central venous structures. The tip of the catheter was placed at the superior cavoatrial junction with fluoroscopy. Fluoroscopic images were obtained for documentation. Both lumens were found to aspirate and flush well. The proper amount of heparin was flushed in  both lumens. The vein dermatotomy site was closed using a single layer of absorbable suture and Dermabond. The catheter was secured to the skin using Prolene suture. FINDINGS: Catheter tip at the superior cavoatrial junction. Estimated blood loss: Minimal COMPLICATIONS: None IMPRESSION: Successful placement of a right jugular tunneled dialysis catheter using ultrasound and fluoroscopic guidance. Electronically Signed   By: Markus Daft M.D.   On: 05/17/2015 16:23    I have reviewed the patient's current medications.  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  2nd tx today, lower vol, solute.  Cont Lasix as still urine 2 Anemia esa/fe 3 HPTH vit D 4 Obesity 5 CAD 6 COPD P HD, esa, lower vol , follow K   LOS: 4 days   Janita Camberos,Zyaire L 05/18/2015,1:06 PM

## 2015-05-18 NOTE — Procedures (Signed)
I was present at this session.  I have reviewed the session itself and made appropriate changes. Catheter functioning well.  bp 10s.  tol well  Janeshia Ciliberto,Colon L 11/26/20166:35 PM

## 2015-05-19 LAB — CBC
HEMATOCRIT: 26.4 % — AB (ref 39.0–52.0)
HEMOGLOBIN: 9.2 g/dL — AB (ref 13.0–17.0)
MCH: 30.4 pg (ref 26.0–34.0)
MCHC: 34.8 g/dL (ref 30.0–36.0)
MCV: 87.1 fL (ref 78.0–100.0)
Platelets: 219 10*3/uL (ref 150–400)
RBC: 3.03 MIL/uL — AB (ref 4.22–5.81)
RDW: 13.9 % (ref 11.5–15.5)
WBC: 13.8 10*3/uL — ABNORMAL HIGH (ref 4.0–10.5)

## 2015-05-19 LAB — BASIC METABOLIC PANEL
Anion gap: 11 (ref 5–15)
BUN: 64 mg/dL — AB (ref 6–20)
CHLORIDE: 94 mmol/L — AB (ref 101–111)
CO2: 28 mmol/L (ref 22–32)
Calcium: 8.1 mg/dL — ABNORMAL LOW (ref 8.9–10.3)
Creatinine, Ser: 3.88 mg/dL — ABNORMAL HIGH (ref 0.61–1.24)
GFR calc non Af Amer: 14 mL/min — ABNORMAL LOW (ref >60)
GFR, EST AFRICAN AMERICAN: 16 mL/min — AB (ref >60)
Glucose, Bld: 130 mg/dL — ABNORMAL HIGH (ref 65–99)
POTASSIUM: 3.4 mmol/L — AB (ref 3.5–5.1)
SODIUM: 133 mmol/L — AB (ref 135–145)

## 2015-05-19 LAB — PROTIME-INR
INR: 1.26 (ref 0.00–1.49)
PROTHROMBIN TIME: 15.9 s — AB (ref 11.6–15.2)

## 2015-05-19 LAB — GLUCOSE, CAPILLARY
GLUCOSE-CAPILLARY: 196 mg/dL — AB (ref 65–99)
GLUCOSE-CAPILLARY: 229 mg/dL — AB (ref 65–99)
Glucose-Capillary: 120 mg/dL — ABNORMAL HIGH (ref 65–99)
Glucose-Capillary: 145 mg/dL — ABNORMAL HIGH (ref 65–99)

## 2015-05-19 MED ORDER — PREDNISONE 20 MG PO TABS
20.0000 mg | ORAL_TABLET | Freq: Every day | ORAL | Status: AC
  Administered 2015-05-20 – 2015-05-21 (×2): 20 mg via ORAL
  Filled 2015-05-19 (×2): qty 1

## 2015-05-19 MED ORDER — ENOXAPARIN SODIUM 100 MG/ML ~~LOC~~ SOLN
95.0000 mg | SUBCUTANEOUS | Status: DC
  Administered 2015-05-19: 95 mg via SUBCUTANEOUS
  Filled 2015-05-19: qty 1

## 2015-05-19 MED ORDER — DEXTROMETHORPHAN POLISTIREX ER 30 MG/5ML PO SUER
15.0000 mg | Freq: Two times a day (BID) | ORAL | Status: DC | PRN
  Administered 2015-05-19 – 2015-05-25 (×10): 15 mg via ORAL
  Filled 2015-05-19 (×15): qty 5

## 2015-05-19 NOTE — Progress Notes (Signed)
ANTICOAGULATION CONSULT NOTE - Initial Consult  Pharmacy Consult for Lovenox  Indication: atrial fibrillation  Allergies  Allergen Reactions  . Levaquin [Levofloxacin In D5w] Itching  . Yellow Dyes (Non-Tartrazine) Other (See Comments)    Burns arms    Patient Measurements: Height: 5\' 8"  (172.7 cm) Weight: 208 lb 5.4 oz (94.5 kg) IBW/kg (Calculated) : 68.4  Vital Signs: Temp: 97.9 F (36.6 C) (11/27 0559) Temp Source: Oral (11/27 0559) BP: 123/62 mmHg (11/27 0559) Pulse Rate: 79 (11/27 0559)  Labs:  Recent Labs  05/17/15 0414 05/17/15 2156 05/17/15 2157 05/18/15 0535 05/18/15 1835 05/19/15 0442  HGB 8.9*  --  8.7* 9.8*  --  9.2*  HCT 26.1*  --  25.1* 28.4*  --  26.4*  PLT 224  --  228 234  --  219  LABPROT 17.5*  --   --  15.6*  --  15.9*  INR 1.43  --   --  1.23  --  1.26  HEPARINUNFRC 2.18*  --   --   --   --   --   CREATININE 5.88* 5.99*  --   --  4.67* 3.88*    Estimated Creatinine Clearance: 17.5 mL/min (by C-G formula based on Cr of 3.88).   Medical History: Past Medical History  Diagnosis Date  . Stroke ([place]) 2012  . High cholesterol   . Hypertension   . Kidney disease     CKD4, sees [place] [facility], considering dialysis  . COPD (chronic obstructive pulmonary disease) ([place])   . CAD (coronary artery disease) [place], [name]  . Acute and chronic respiratory failure 10/22/2012  . Osteoarthritis 10/22/2012  . Pneumonia April 2014  . Atrial fibrillation [facility])     Assessment: Pharmacy consulted to dose treatment dose lovenox for afib. Pt was on apixiban PTA and was switched to warfarin and heparin IV bridge inpatient. Warfarin and heparin were stopped in anticipation of a permacath insertion.   Hgb 9.2, PLT 219, SCr 4.67>3.88  Goal of Therapy:  Monitor platelets by anticoagulation protocol: Yes   Plan:  Lovenox adjusted to 95mg  [place] Q24h (deccreasing weights) Re-start coumadin after fistula creation on 11/29 per MD note Monitor for s/s of  bleeding    Ortha Metts C. Lennox Grumbles, PharmD Pharmacy Resident  Pager: [phone] 05/19/2015 7:28 AM

## 2015-05-19 NOTE — Progress Notes (Signed)
Subjective: Interval History: has no complaint tired but breathing much better. Can lie in bed.  Objective: Vital signs in last 24 hours: Temp:  [97 F (36.1 C)-97.9 F (36.6 C)] 97.9 F (36.6 C) (11/27 0559) Pulse Rate:  [66-87] 79 (11/27 0559) Resp:  [17-20] 20 (11/27 0559) BP: (90-146)/(54-73) 123/62 mmHg (11/27 0559) SpO2:  [91 %-97 %] 97 % (11/27 0914) Weight:  [94.5 kg (208 lb 5.4 oz)-96.5 kg (212 lb 11.9 oz)] 94.5 kg (208 lb 5.4 oz) (11/26 2115) Weight change: -3.3 kg (-7 lb 4.4 oz)  Intake/Output from previous day: 11/26 0701 - 11/27 0700 In: 440 [P.O.:440] Out: 2013  Intake/Output this shift:    General appearance: alert, cooperative, no distress, moderately obese and pale Resp: diminished breath sounds bilaterally and rales bibasilar Chest wall: RIJ cath Cardio: S1, S2 normal and systolic murmur: holosystolic 2/6, blowing at apex GI: obese, pos bs, soft Extremities: edema 2+  Lab Results:  Recent Labs  05/18/15 0535 05/19/15 0442  WBC 15.4* 13.8*  HGB 9.8* 9.2*  HCT 28.4* 26.4*  PLT 234 219   BMET:  Recent Labs  05/18/15 1835 05/19/15 0442  NA 127* 133*  K 3.3* 3.4*  CL 91* 94*  CO2 24 28  GLUCOSE 203* 130*  BUN 92* 64*  CREATININE 4.67* 3.88*  CALCIUM 7.5* 8.1*   No results for input(s): PTH in the last 72 hours. Iron Studies: No results for input(s): IRON, TIBC, TRANSFERRIN, FERRITIN in the last 72 hours.  Studies/Results: Ir Fluoro Guide Cv Line Right  05/17/2015  INDICATION: Chronic kidney disease with early uremia. Need to start hemodialysis. EXAM: FLUOROSCOPIC AND ULTRASOUND GUIDED PLACEMENT OF A TUNNELED DIALYSIS CATHETER Physician: Stephan Minister. Henn, MD FLUOROSCOPY TIME:  42 seconds, 13.2 mGy MEDICATIONS: 2 g Ancef. As antibiotic prophylaxis, Ancef was ordered pre-procedure and administered intravenously within one hour of incision. 0.5 mg versed, 25 mcg fentanyl. A radiology nurse monitored the patient for moderate sedation.  ANESTHESIA/SEDATION: Moderate sedation time: 30 minutes PROCEDURE: Informed consent was obtained for placement of a tunneled dialysis catheter. The patient was placed supine on the interventional table. Ultrasound confirmed a patent right internal jugularvein. Ultrasound images were obtained for documentation. The right side of the neck was prepped and draped in a sterile fashion. The right side of the neck was anesthetized with 1% lidocaine. Maximal barrier sterile technique was utilized including caps, mask, sterile gowns, sterile gloves, sterile drape, hand hygiene and skin antiseptic. A small incision was made with #11 blade scalpel. A 21 gauge needle directed into the right internal jugular vein with ultrasound guidance. A micropuncture dilator set was placed. A 23 cm tip to cuff Palindrome catheter was selected. The skin below the right clavicle was anesthetized and a small incision was made with an #11 blade scalpel. A subcutaneous tunnel was formed to the vein dermatotomy site. The catheter was brought through the tunnel. The vein dermatotomy site was dilated to accommodate a peel-away sheath. The catheter was placed through the peel-away sheath and directed into the central venous structures. The tip of the catheter was placed at the superior cavoatrial junction with fluoroscopy. Fluoroscopic images were obtained for documentation. Both lumens were found to aspirate and flush well. The proper amount of heparin was flushed in both lumens. The vein dermatotomy site was closed using a single layer of absorbable suture and Dermabond. The catheter was secured to the skin using Prolene suture. FINDINGS: Catheter tip at the superior cavoatrial junction. Estimated blood loss: Minimal COMPLICATIONS: None  IMPRESSION: Successful placement of a right jugular tunneled dialysis catheter using ultrasound and fluoroscopic guidance. Electronically Signed   By: Markus Daft M.D.   On: 05/17/2015 16:23   Ir US Guide Vasc  Access Right  05/17/2015  INDICATION: Chronic kidney disease with early uremia. Need to start hemodialysis. EXAM: FLUOROSCOPIC AND ULTRASOUND GUIDED PLACEMENT OF A TUNNELED DIALYSIS CATHETER Physician: Stephan Minister. Henn, MD FLUOROSCOPY TIME:  42 seconds, 13.2 mGy MEDICATIONS: 2 g Ancef. As antibiotic prophylaxis, Ancef was ordered pre-procedure and administered intravenously within one hour of incision. 0.5 mg versed, 25 mcg fentanyl. A radiology nurse monitored the patient for moderate sedation. ANESTHESIA/SEDATION: Moderate sedation time: 30 minutes PROCEDURE: Informed consent was obtained for placement of a tunneled dialysis catheter. The patient was placed supine on the interventional table. Ultrasound confirmed a patent right internal jugularvein. Ultrasound images were obtained for documentation. The right side of the neck was prepped and draped in a sterile fashion. The right side of the neck was anesthetized with 1% lidocaine. Maximal barrier sterile technique was utilized including caps, mask, sterile gowns, sterile gloves, sterile drape, hand hygiene and skin antiseptic. A small incision was made with #11 blade scalpel. A 21 gauge needle directed into the right internal jugular vein with ultrasound guidance. A micropuncture dilator set was placed. A 23 cm tip to cuff Palindrome catheter was selected. The skin below the right clavicle was anesthetized and a small incision was made with an #11 blade scalpel. A subcutaneous tunnel was formed to the vein dermatotomy site. The catheter was brought through the tunnel. The vein dermatotomy site was dilated to accommodate a peel-away sheath. The catheter was placed through the peel-away sheath and directed into the central venous structures. The tip of the catheter was placed at the superior cavoatrial junction with fluoroscopy. Fluoroscopic images were obtained for documentation. Both lumens were found to aspirate and flush well. The proper amount of heparin was  flushed in both lumens. The vein dermatotomy site was closed using a single layer of absorbable suture and Dermabond. The catheter was secured to the skin using Prolene suture. FINDINGS: Catheter tip at the superior cavoatrial junction. Estimated blood loss: Minimal COMPLICATIONS: None IMPRESSION: Successful placement of a right jugular tunneled dialysis catheter using ultrasound and fluoroscopic guidance. Electronically Signed   By: Markus Daft M.D.   On: 05/17/2015 16:23    I have reviewed the patient's current medications.  Assessment/Plan: 1 CKD4-5 now on HD.  Much better breathing with 2 HD and vol/solute control.  Still on lasix as is making some urine.   Need to get perm access, plan HD on Tues 2 Anemia on esa/fe 3 HPTH on vit D 4 Copd better with less vol 5 CAD 6 DJD 7 CHF vol better P HD Tues, access, lasix, esa,     LOS: 5 days   Garrick Midgley,Zorian L 05/19/2015,10:36 AM

## 2015-05-19 NOTE — Progress Notes (Signed)
PROGRESS NOTE  Terry [name] [id number] DOB: September 22, 1934 DOA: 05/13/2015 PCP: [name], DO   Brief history 80 year old male with a history of COPD, hypertension, CAD/CABG, CKD stage IV, atrial fibrillation diagnosed October 2015 on apixaban presented with one-week history of worsening cough and shortness of breath. The patient was seen by his primary care provider 4 days prior to admission and prescribed Augmentin and prednisone without improvement. He presented to the emergency department where he was noted to have oxygen saturation of 80% on room air and found to be in respiratory distress. The patient was started on doxycycline and Solu-Medrol. The patient has also been describing symptoms of orthopnea and PND with increasing abdominal girth and lower extremity edema for the past week prior to admission. After admission, the patient was started on intravenous steroids. His breathing did not improve. He subsequently started on intravenous furosemide for volume excess. Assessment/Plan: Acute respiratory failure with hypoxia -Secondary to CHF and COPD -Presently stable on 3 L nasal cannula -Wean oxygen for saturation greater than 92% -Pulmonary hygiene Acute diastolic CHF  -remains clinically volume overloaded  -Continue po furosemide as pt still urinates -05/16/2015 Echocardiogram--EF 55-60%, trivial MR/TR -Daily weights--question accuracy -Dry weight is approximately 203 pounds -put out 5.1L with furosemide; 6L pulled off additional with HD Acute on chronic renal failure (CKD 4-5)-->ESRD -Baseline creatinine 3.2-3.4 -Appreciate nephrology-->plans for perm-cath -Previously scheduled for fistula creation 05/21/2015 -05/17/2015--PermCath placed by radiology COPD exacerbation  -Patient has >60 pk years -wean prednisone -Pulmonary hygiene  -Continue bronchodilators --pt intermittenly refusing -Wean oxygen for saturation greater than 92%  Atrial fibrillation   -CHADS-VASc = 6 -Discontinue apixaban due to worsening renal function -d/c coumadin and switch to lovenox per renal recommendations -restart coumadin when procedures are done--fistula creation planned 11/29 -Continue metoprolol tartrate -05/16/15--vitamin K given 5mg  Hypertension -Continue metoprolol tartrate, Cardura -decrease metoprolol tartrate as BP decreases with HD Diabetes mellitus type 2 -Check hemoglobin A1c--6.6 -pt agreeable to start po meds upon d/c -NovoLog sliding scale Coronary artery disease -Cardiac cath 11/30/1997. Mild distal Left main stenosis, 25% proximal LAD, minor irregularities Circumflex, 95% mid RCA stenosis treated with 3.5 x 32 mm bare metal stent -no angina presently Hyponatremia -Secondary to worsening renal function and fluid overload  Family Communication: Wife updated at beside 11/27 Disposition Plan: CLIP process prior to d/c home       Procedures/Studies: Dg Chest 2 View  05/13/2015  CLINICAL DATA:  80 year old with 1 week history of productive cough and severe shortness of breath. Former smoker with current history of COPD. EXAM: CHEST  2 VIEW COMPARISON:  05/28/2014 and earlier. FINDINGS: AP erect and lateral imaging was performed. Cardiac silhouette mildly enlarged with a right coronary artery stent. Thoracic aorta atherosclerotic, unchanged. Prominent central pulmonary arteries, unchanged. Scarring in the lower lobes, right greater than left, unchanged. Hyperinflation, emphysematous changes in the upper lobes, and mild central peribronchial thickening, unchanged. No new pulmonary parenchymal abnormalities. Degenerative disc disease and spondylosis involving the thoracic spine. IMPRESSION: COPD/emphysema. Scarring in the lower lobes, right greater than left. No acute cardiopulmonary disease. Stable examination. Electronically Signed   By: Evangeline Dakin M.D.   On: 05/13/2015 21:03   US Renal  05/15/2015  CLINICAL DATA:  Acute renal  injury EXAM: RENAL / URINARY TRACT ULTRASOUND COMPLETE COMPARISON:  10/24/2014 FINDINGS: Right Kidney: Length: 14.8 cm. Multiple cysts are noted. The largest of these measures 2.2 cm in greatest dimension. Left Kidney: Length: 14.0 cm.  Multiple cysts are noted. The largest of these measures 3.1 cm in greatest dimension. Bladder: Appears normal for degree of bladder distention. IMPRESSION: Bilateral renal cysts similar to that seen on prior MRI examination. No acute obstructive changes are noted. Electronically Signed   By: Inez Catalina M.D.   On: 05/15/2015 16:29   Ir Fluoro Guide Cv Line Right  05/17/2015  INDICATION: Chronic kidney disease with early uremia. Need to start hemodialysis. EXAM: FLUOROSCOPIC AND ULTRASOUND GUIDED PLACEMENT OF A TUNNELED DIALYSIS CATHETER Physician: Stephan Minister. Henn, MD FLUOROSCOPY TIME:  42 seconds, 13.2 mGy MEDICATIONS: 2 g Ancef. As antibiotic prophylaxis, Ancef was ordered pre-procedure and administered intravenously within one hour of incision. 0.5 mg versed, 25 mcg fentanyl. A radiology nurse monitored the patient for moderate sedation. ANESTHESIA/SEDATION: Moderate sedation time: 30 minutes PROCEDURE: Informed consent was obtained for placement of a tunneled dialysis catheter. The patient was placed supine on the interventional table. Ultrasound confirmed a patent right internal jugularvein. Ultrasound images were obtained for documentation. The right side of the neck was prepped and draped in a sterile fashion. The right side of the neck was anesthetized with 1% lidocaine. Maximal barrier sterile technique was utilized including caps, mask, sterile gowns, sterile gloves, sterile drape, hand hygiene and skin antiseptic. A small incision was made with #11 blade scalpel. A 21 gauge needle directed into the right internal jugular vein with ultrasound guidance. A micropuncture dilator set was placed. A 23 cm tip to cuff Palindrome catheter was selected. The skin below the right  clavicle was anesthetized and a small incision was made with an #11 blade scalpel. A subcutaneous tunnel was formed to the vein dermatotomy site. The catheter was brought through the tunnel. The vein dermatotomy site was dilated to accommodate a peel-away sheath. The catheter was placed through the peel-away sheath and directed into the central venous structures. The tip of the catheter was placed at the superior cavoatrial junction with fluoroscopy. Fluoroscopic images were obtained for documentation. Both lumens were found to aspirate and flush well. The proper amount of heparin was flushed in both lumens. The vein dermatotomy site was closed using a single layer of absorbable suture and Dermabond. The catheter was secured to the skin using Prolene suture. FINDINGS: Catheter tip at the superior cavoatrial junction. Estimated blood loss: Minimal COMPLICATIONS: None IMPRESSION: Successful placement of a right jugular tunneled dialysis catheter using ultrasound and fluoroscopic guidance. Electronically Signed   By: Markus Daft M.D.   On: 05/17/2015 16:23   Ir US Guide Vasc Access Right  05/17/2015  INDICATION: Chronic kidney disease with early uremia. Need to start hemodialysis. EXAM: FLUOROSCOPIC AND ULTRASOUND GUIDED PLACEMENT OF A TUNNELED DIALYSIS CATHETER Physician: Stephan Minister. Henn, MD FLUOROSCOPY TIME:  42 seconds, 13.2 mGy MEDICATIONS: 2 g Ancef. As antibiotic prophylaxis, Ancef was ordered pre-procedure and administered intravenously within one hour of incision. 0.5 mg versed, 25 mcg fentanyl. A radiology nurse monitored the patient for moderate sedation. ANESTHESIA/SEDATION: Moderate sedation time: 30 minutes PROCEDURE: Informed consent was obtained for placement of a tunneled dialysis catheter. The patient was placed supine on the interventional table. Ultrasound confirmed a patent right internal jugularvein. Ultrasound images were obtained for documentation. The right side of the neck was prepped and  draped in a sterile fashion. The right side of the neck was anesthetized with 1% lidocaine. Maximal barrier sterile technique was utilized including caps, mask, sterile gowns, sterile gloves, sterile drape, hand hygiene and skin antiseptic. A small incision was made with #11 blade  scalpel. A 21 gauge needle directed into the right internal jugular vein with ultrasound guidance. A micropuncture dilator set was placed. A 23 cm tip to cuff Palindrome catheter was selected. The skin below the right clavicle was anesthetized and a small incision was made with an #11 blade scalpel. A subcutaneous tunnel was formed to the vein dermatotomy site. The catheter was brought through the tunnel. The vein dermatotomy site was dilated to accommodate a peel-away sheath. The catheter was placed through the peel-away sheath and directed into the central venous structures. The tip of the catheter was placed at the superior cavoatrial junction with fluoroscopy. Fluoroscopic images were obtained for documentation. Both lumens were found to aspirate and flush well. The proper amount of heparin was flushed in both lumens. The vein dermatotomy site was closed using a single layer of absorbable suture and Dermabond. The catheter was secured to the skin using Prolene suture. FINDINGS: Catheter tip at the superior cavoatrial junction. Estimated blood loss: Minimal COMPLICATIONS: None IMPRESSION: Successful placement of a right jugular tunneled dialysis catheter using ultrasound and fluoroscopic guidance. Electronically Signed   By: Markus Daft M.D.   On: 05/17/2015 16:23         Subjective: Patient unable to lie flat yet but improving. Denies any chest pain, nausea, vomiting, diarrhea, abdominal pain. No dysuria or hematuria. Still has some dyspnea on exertion and orthopnea.  Objective: Filed Vitals:   05/19/15 0559 05/19/15 0914 05/19/15 1324 05/19/15 1434  BP: 123/62  189/158   Pulse: 79  93   Temp: 97.9 F (36.6 C)      TempSrc: Oral     Resp: 20  24   Height:      Weight:      SpO2: 97% 97% 93% 94%    Intake/Output Summary (Last 24 hours) at 05/19/15 1625 Last data filed at 05/18/15 2115  Gross per 24 hour  Intake    200 ml  Output   2013 ml  Net  -1813 ml   Weight change: -3.3 kg (-7 lb 4.4 oz) Exam:   General:  Pt is alert, follows commands appropriately, not in acute distress  HEENT: No icterus, No thrush, No neck mass, [place]/AT  Cardiovascular: RRR, S1/S2, no rubs, no gallops  Respiratory: Bibasilar crackles. No wheezing  Abdomen: Soft/+BS, non tender, non distended, no guarding  Extremities: 2+ LE edema, No lymphangitis, No petechiae, No rashes, no synovitis  Data Reviewed: Basic Metabolic Panel:  Recent Labs Lab 05/16/15 0248 05/17/15 0414 05/17/15 2156 05/18/15 1835 05/19/15 0442  NA 128* 127* 124* 127* 133*  K 3.9 3.4* 3.2* 3.3* 3.4*  CL 95* 93* 89* 91* 94*  CO2 20* 20* 21* 24 28  GLUCOSE 185* 160* 155* 203* 130*  BUN 109* 121* 127* 92* 64*  CREATININE 5.51* 5.88* 5.99* 4.67* 3.88*  CALCIUM 7.8* 7.6* 7.3* 7.5* 8.1*  MG 1.9  --   --   --   --   PHOS 7.1* 8.2* 8.2* 6.2*  --    Liver Function Tests:  Recent Labs Lab 05/16/15 0248 05/17/15 0414 05/17/15 2156 05/18/15 1835  AST 18  --   --   --   ALT 22  --   --   --   ALKPHOS 38  --   --   --   BILITOT 0.5  --   --   --   PROT 5.7*  --   --   --   ALBUMIN 3.0* 3.1* 3.0* 3.0*  No results for input(s): LIPASE, AMYLASE in the last 168 hours. No results for input(s): AMMONIA in the last 168 hours. CBC:  Recent Labs Lab 05/15/15 0017 05/16/15 0248 05/17/15 0414 05/17/15 2157 05/18/15 0535 05/19/15 0442  WBC 12.3* 13.7* 12.9* 12.9* 15.4* 13.8*  NEUTROABS 11.5*  --   --   --   --   --   HGB 8.8* 8.6* 8.9* 8.7* 9.8* 9.2*  HCT 25.4* 25.0* 26.1* 25.1* 28.4* 26.4*  MCV 87.0 86.5 86.1 86.3 85.8 87.1  PLT 205 218 224 228 234 219   Cardiac Enzymes:  Recent Labs Lab 05/14/15 0338  TROPONINI <0.03    BNP: Invalid input(s): POCBNP CBG:  Recent Labs Lab 05/18/15 1208 05/18/15 1709 05/18/15 2245 05/19/15 0823 05/19/15 1212  GLUCAP 172* 179* 160* 120* 196*    Recent Results (from the past 240 hour(s))  MRSA PCR Screening     Status: Abnormal   Collection Time: 05/14/15  7:18 AM  Result Value Ref Range Status   MRSA by PCR POSITIVE (A) NEGATIVE Final    Comment:        The GeneXpert MRSA Assay (FDA approved for NASAL specimens only), is one component of a comprehensive MRSA colonization surveillance program. It is not intended to diagnose MRSA infection nor to guide or monitor treatment for MRSA infections. RESULT CALLED TO, READ BACK BY AND VERIFIED WITH: H. MILLS RN 9:55 05/14/15 ([email or web address])      Scheduled Meds: . atorvastatin  20 mg Oral q1800  . budesonide-formoterol  2 puff Inhalation BID  . calcitRIOL  0.25 mcg Oral Daily  .  ceFAZolin (ANCEF) IV  2 g Intravenous Once  . Chlorhexidine Gluconate Cloth  6 each Topical Q0600  . darbepoetin (ARANESP) injection - NON-DIALYSIS  200 mcg Subcutaneous Q Thu-1800  . doxazosin  4 mg Oral Daily  . doxycycline  100 mg Oral Q12H  . enoxaparin (LOVENOX) injection  95 mg Subcutaneous Q24H  . furosemide  160 mg Oral TID  . insulin aspart  0-15 Units Subcutaneous TID WC  . insulin aspart  0-5 Units Subcutaneous QHS  . ipratropium-albuterol  3 mL Nebulization TID  . metoprolol  25 mg Oral BID  . [START ON 05/20/2015] predniSONE  20 mg Oral Q breakfast  . sodium chloride  500 mL Intravenous Once  . sodium chloride  3 mL Intravenous Q12H  . tiotropium  18 mcg Inhalation Daily   Continuous Infusions:    Kendrell Lottman, DO  [facility] Pager [phone]  If 7PM-7AM, please contact night-coverage [email or web address] Password [email or web address] 05/19/2015, 4:25 PM   LOS: 5 days

## 2015-05-20 LAB — CBC
HCT: 27.3 % — ABNORMAL LOW (ref 39.0–52.0)
Hemoglobin: 9.5 g/dL — ABNORMAL LOW (ref 13.0–17.0)
MCH: 30.6 pg (ref 26.0–34.0)
MCHC: 34.8 g/dL (ref 30.0–36.0)
MCV: 88.1 fL (ref 78.0–100.0)
PLATELETS: 210 10*3/uL (ref 150–400)
RBC: 3.1 MIL/uL — AB (ref 4.22–5.81)
RDW: 14.1 % (ref 11.5–15.5)
WBC: 18.5 10*3/uL — AB (ref 4.0–10.5)

## 2015-05-20 LAB — RENAL FUNCTION PANEL
ALBUMIN: 3.1 g/dL — AB (ref 3.5–5.0)
Anion gap: 12 (ref 5–15)
BUN: 89 mg/dL — AB (ref 6–20)
CALCIUM: 8.3 mg/dL — AB (ref 8.9–10.3)
CHLORIDE: 93 mmol/L — AB (ref 101–111)
CO2: 26 mmol/L (ref 22–32)
CREATININE: 5.08 mg/dL — AB (ref 0.61–1.24)
GFR, EST AFRICAN AMERICAN: 11 mL/min — AB (ref >60)
GFR, EST NON AFRICAN AMERICAN: 10 mL/min — AB (ref >60)
Glucose, Bld: 103 mg/dL — ABNORMAL HIGH (ref 65–99)
Phosphorus: 7.1 mg/dL — ABNORMAL HIGH (ref 2.5–4.6)
Potassium: 3.1 mmol/L — ABNORMAL LOW (ref 3.5–5.1)
SODIUM: 131 mmol/L — AB (ref 135–145)

## 2015-05-20 LAB — GLUCOSE, CAPILLARY
GLUCOSE-CAPILLARY: 107 mg/dL — AB (ref 65–99)
GLUCOSE-CAPILLARY: 171 mg/dL — AB (ref 65–99)
GLUCOSE-CAPILLARY: 223 mg/dL — AB (ref 65–99)
GLUCOSE-CAPILLARY: 154 mg/dL — AB (ref 65–99)

## 2015-05-20 LAB — PROTIME-INR
INR: 1.28 (ref 0.00–1.49)
PROTHROMBIN TIME: 16.1 s — AB (ref 11.6–15.2)

## 2015-05-20 MED ORDER — ENOXAPARIN SODIUM 100 MG/ML ~~LOC~~ SOLN: 95.0000 mg | SUBCUTANEOUS | Status: DC

## 2015-05-20 MED ORDER — CEFAZOLIN SODIUM 1-5 GM-% IV SOLN: 1.0000 g | INTRAVENOUS | Status: AC

## 2015-05-20 MED ORDER — POTASSIUM CHLORIDE CRYS ER 20 MEQ PO TBCR
40.0000 meq | EXTENDED_RELEASE_TABLET | Freq: Once | ORAL | Status: AC
Start: 2015-05-20 — End: 2015-05-20
  Administered 2015-05-20: 40 meq via ORAL
  Filled 2015-05-20: qty 2

## 2015-05-20 MED ORDER — CLONAZEPAM 1 MG PO TABS
1.0000 mg | ORAL_TABLET | Freq: Once | ORAL | Status: AC
  Administered 2015-05-20: 1 mg via ORAL
  Filled 2015-05-20: qty 1

## 2015-05-20 NOTE — Interval H&P Note (Signed)
History and Physical Interval Note:  05/20/2015 8:13 AM  Terry [name]  has presented today for surgery, with the diagnosis of Right arm arteriovenous fistula creation N18.4  The various methods of treatment have been discussed with the patient and family. After consideration of risks, benefits and other options for treatment, the patient has consented to  Procedure(s): ARTERIOVENOUS (AV) FISTULA CREATION (Right) as a surgical intervention .  The patient's history has been reviewed, patient examined, no change in status, stable for surgery.  I have reviewed the patient's chart and labs.  Questions were answered to the patient's satisfaction.     Deitra Mayo

## 2015-05-20 NOTE — Progress Notes (Addendum)
PROGRESS NOTE  Terry [name] [id number] DOB: 04-26-1935 DOA: 05/13/2015 PCP: [name], DO  Brief history 80 year old male with a history of COPD, hypertension, CAD/CABG, CKD stage IV, atrial fibrillation diagnosed October 2015 on apixaban presented with one-week history of worsening cough and shortness of breath. The patient was seen by his primary care provider 4 days prior to admission and prescribed Augmentin and prednisone without improvement. He presented to the emergency department where he was noted to have oxygen saturation of 80% on room air and found to be in respiratory distress. The patient was started on doxycycline and Solu-Medrol. The patient has also been describing symptoms of orthopnea and PND with increasing abdominal girth and lower extremity edema for the past week prior to admission. After admission, the patient was started on intravenous steroids. His breathing did not improve. He subsequently started on intravenous furosemide for volume excess. Assessment/Plan: Acute respiratory failure with hypoxia -Secondary to CHF and COPD -Presently stable on 2 L nasal cannula -Wean oxygen for saturation greater than 92% -Pulmonary hygiene Acute diastolic CHF  -remains clinically volume overloaded  -Continue po furosemide as pt still urinates -05/16/2015 Echocardiogram--EF 55-60%, trivial MR/TR -Daily weights--question accuracy -Dry weight is approximately 203 pounds -put out 5.1L with furosemide; 6L pulled off additional with HD -11/28--now manage fluid with HD Acute on chronic renal failure (CKD 4-5)-->ESRD -Baseline creatinine 3.2-3.4 -Appreciate nephrology-->plans for perm-cath -Previously scheduled for fistula creation 05/21/2015 -05/17/2015--PermCath placed by radiology -[id number] brachiocephalic AVF -[id number] HD--klonopin prior to HD for anxiety and cramping COPD exacerbation  -Patient has >60 pk years -wean prednisone--last dose on  11/29 -Pulmonary hygiene  -Continue bronchodilators --pt intermittenly refusing -Wean oxygen for saturation greater than 92%  Atrial fibrillation  -CHADS-VASc = 6 -Discontinue apixaban due to worsening renal function -d/c coumadin and switch to lovenox per renal recommendations -restart coumadin when procedures are done--fistula creation planned 11/29 -Continue metoprolol tartrate -05/16/15--vitamin K given 5mg  Hypertension -Continue metoprolol tartrate, Cardura -decrease metoprolol tartrate as BP decreases with HD Diabetes mellitus type 2 -Check hemoglobin A1c--6.6 -pt agreeable to start po meds upon d/c -NovoLog sliding scale Coronary artery disease -Cardiac cath 11/30/1997. Mild distal Left main stenosis, 25% proximal LAD, minor irregularities Circumflex, 95% mid RCA stenosis treated with 3.5 x 32 mm bare metal stent -no angina presently Hyponatremia -Secondary to worsening renal function and fluid overload -improved with HD  Leukocytosis -due to steroids -afebrile and hemodynamically stable  Family Communication: Wife updated at beside 11/27 Disposition Plan: CLIP process prior to d/c home     Procedures/Studies: Dg Chest 2 View  05/13/2015  CLINICAL DATA:  80 year old with 1 week history of productive cough and severe shortness of breath. Former smoker with current history of COPD. EXAM: CHEST  2 VIEW COMPARISON:  05/28/2014 and earlier. FINDINGS: AP erect and lateral imaging was performed. Cardiac silhouette mildly enlarged with a right coronary artery stent. Thoracic aorta atherosclerotic, unchanged. Prominent central pulmonary arteries, unchanged. Scarring in the lower lobes, right greater than left, unchanged. Hyperinflation, emphysematous changes in the upper lobes, and mild central peribronchial thickening, unchanged. No new pulmonary parenchymal abnormalities. Degenerative disc disease and spondylosis involving the thoracic spine. IMPRESSION: COPD/emphysema.  Scarring in the lower lobes, right greater than left. No acute cardiopulmonary disease. Stable examination. Electronically Signed   By: Evangeline Dakin M.D.   On: 05/13/2015 21:03   US Renal  05/15/2015  CLINICAL DATA:  Acute renal injury EXAM: RENAL / URINARY TRACT ULTRASOUND  COMPLETE COMPARISON:  10/24/2014 FINDINGS: Right Kidney: Length: 14.8 cm. Multiple cysts are noted. The largest of these measures 2.2 cm in greatest dimension. Left Kidney: Length: 14.0 cm. Multiple cysts are noted. The largest of these measures 3.1 cm in greatest dimension. Bladder: Appears normal for degree of bladder distention. IMPRESSION: Bilateral renal cysts similar to that seen on prior MRI examination. No acute obstructive changes are noted. Electronically Signed   By: Inez Catalina M.D.   On: 05/15/2015 16:29   Ir Fluoro Guide Cv Line Right  05/17/2015  INDICATION: Chronic kidney disease with early uremia. Need to start hemodialysis. EXAM: FLUOROSCOPIC AND ULTRASOUND GUIDED PLACEMENT OF A TUNNELED DIALYSIS CATHETER Physician: Stephan Minister. Henn, MD FLUOROSCOPY TIME:  42 seconds, 13.2 mGy MEDICATIONS: 2 g Ancef. As antibiotic prophylaxis, Ancef was ordered pre-procedure and administered intravenously within one hour of incision. 0.5 mg versed, 25 mcg fentanyl. A radiology nurse monitored the patient for moderate sedation. ANESTHESIA/SEDATION: Moderate sedation time: 30 minutes PROCEDURE: Informed consent was obtained for placement of a tunneled dialysis catheter. The patient was placed supine on the interventional table. Ultrasound confirmed a patent right internal jugularvein. Ultrasound images were obtained for documentation. The right side of the neck was prepped and draped in a sterile fashion. The right side of the neck was anesthetized with 1% lidocaine. Maximal barrier sterile technique was utilized including caps, mask, sterile gowns, sterile gloves, sterile drape, hand hygiene and skin antiseptic. A small incision was made  with #11 blade scalpel. A 21 gauge needle directed into the right internal jugular vein with ultrasound guidance. A micropuncture dilator set was placed. A 23 cm tip to cuff Palindrome catheter was selected. The skin below the right clavicle was anesthetized and a small incision was made with an #11 blade scalpel. A subcutaneous tunnel was formed to the vein dermatotomy site. The catheter was brought through the tunnel. The vein dermatotomy site was dilated to accommodate a peel-away sheath. The catheter was placed through the peel-away sheath and directed into the central venous structures. The tip of the catheter was placed at the superior cavoatrial junction with fluoroscopy. Fluoroscopic images were obtained for documentation. Both lumens were found to aspirate and flush well. The proper amount of heparin was flushed in both lumens. The vein dermatotomy site was closed using a single layer of absorbable suture and Dermabond. The catheter was secured to the skin using Prolene suture. FINDINGS: Catheter tip at the superior cavoatrial junction. Estimated blood loss: Minimal COMPLICATIONS: None IMPRESSION: Successful placement of a right jugular tunneled dialysis catheter using ultrasound and fluoroscopic guidance. Electronically Signed   By: Markus Daft M.D.   On: 05/17/2015 16:23   Ir US Guide Vasc Access Right  05/17/2015  INDICATION: Chronic kidney disease with early uremia. Need to start hemodialysis. EXAM: FLUOROSCOPIC AND ULTRASOUND GUIDED PLACEMENT OF A TUNNELED DIALYSIS CATHETER Physician: Stephan Minister. Henn, MD FLUOROSCOPY TIME:  42 seconds, 13.2 mGy MEDICATIONS: 2 g Ancef. As antibiotic prophylaxis, Ancef was ordered pre-procedure and administered intravenously within one hour of incision. 0.5 mg versed, 25 mcg fentanyl. A radiology nurse monitored the patient for moderate sedation. ANESTHESIA/SEDATION: Moderate sedation time: 30 minutes PROCEDURE: Informed consent was obtained for placement of a tunneled  dialysis catheter. The patient was placed supine on the interventional table. Ultrasound confirmed a patent right internal jugularvein. Ultrasound images were obtained for documentation. The right side of the neck was prepped and draped in a sterile fashion. The right side of the neck was anesthetized with 1%  lidocaine. Maximal barrier sterile technique was utilized including caps, mask, sterile gowns, sterile gloves, sterile drape, hand hygiene and skin antiseptic. A small incision was made with #11 blade scalpel. A 21 gauge needle directed into the right internal jugular vein with ultrasound guidance. A micropuncture dilator set was placed. A 23 cm tip to cuff Palindrome catheter was selected. The skin below the right clavicle was anesthetized and a small incision was made with an #11 blade scalpel. A subcutaneous tunnel was formed to the vein dermatotomy site. The catheter was brought through the tunnel. The vein dermatotomy site was dilated to accommodate a peel-away sheath. The catheter was placed through the peel-away sheath and directed into the central venous structures. The tip of the catheter was placed at the superior cavoatrial junction with fluoroscopy. Fluoroscopic images were obtained for documentation. Both lumens were found to aspirate and flush well. The proper amount of heparin was flushed in both lumens. The vein dermatotomy site was closed using a single layer of absorbable suture and Dermabond. The catheter was secured to the skin using Prolene suture. FINDINGS: Catheter tip at the superior cavoatrial junction. Estimated blood loss: Minimal COMPLICATIONS: None IMPRESSION: Successful placement of a right jugular tunneled dialysis catheter using ultrasound and fluoroscopic guidance. Electronically Signed   By: Markus Daft M.D.   On: 05/17/2015 16:23         Subjective: Patient is overall breathing better but still has orthopnea. Denies any nausea, vomiting or diarrhea, abdominal  pain.denies fevers, chills, chest pain.   Objective: Filed Vitals:   05/20/15 0904 05/20/15 0908 05/20/15 1246 05/20/15 1417  BP:   126/55   Pulse:   81   Temp:   98.2 F (36.8 C)   TempSrc:   Oral   Resp:   20   Height:      Weight:      SpO2: 94% 95% 98% 97%    Intake/Output Summary (Last 24 hours) at 05/20/15 1948 Last data filed at 05/20/15 1300  Gross per 24 hour  Intake    598 ml  Output      0 ml  Net    598 ml   Weight change:  Exam:   General:  Pt is alert, follows commands appropriately, not in acute distress  HEENT: No icterus, No thrush, No neck mass, [place]/AT  Cardiovascular: RRR, S1/S2, no rubs, no gallops  Respiratory: bilateral rales. No wheezing. Good air movement  Abdomen: Soft/+BS, non tender, non distended, no guarding  Extremities: 2+ LE edema, No lymphangitis, No petechiae, No rashes, no synovitis  Data Reviewed: Basic Metabolic Panel:  Recent Labs Lab 05/16/15 0248 05/17/15 0414 05/17/15 2156 05/18/15 1835 05/19/15 0442 05/20/15 0718  NA 128* 127* 124* 127* 133* 131*  K 3.9 3.4* 3.2* 3.3* 3.4* 3.1*  CL 95* 93* 89* 91* 94* 93*  CO2 20* 20* 21* 24 28 26   GLUCOSE 185* 160* 155* 203* 130* 103*  BUN 109* 121* 127* 92* 64* 89*  CREATININE 5.51* 5.88* 5.99* 4.67* 3.88* 5.08*  CALCIUM 7.8* 7.6* 7.3* 7.5* 8.1* 8.3*  MG 1.9  --   --   --   --   --   PHOS 7.1* 8.2* 8.2* 6.2*  --  7.1*   Liver Function Tests:  Recent Labs Lab 05/16/15 0248 05/17/15 0414 05/17/15 2156 05/18/15 1835 05/20/15 0718  AST 18  --   --   --   --   ALT 22  --   --   --   --  ALKPHOS 38  --   --   --   --   BILITOT 0.5  --   --   --   --   PROT 5.7*  --   --   --   --   ALBUMIN 3.0* 3.1* 3.0* 3.0* 3.1*   No results for input(s): LIPASE, AMYLASE in the last 168 hours. No results for input(s): AMMONIA in the last 168 hours. CBC:  Recent Labs Lab 05/15/15 0017  05/17/15 0414 05/17/15 2157 05/18/15 0535 05/19/15 0442 05/20/15 0718  WBC 12.3*  < >  12.9* 12.9* 15.4* 13.8* 18.5*  NEUTROABS 11.5*  --   --   --   --   --   --   HGB 8.8*  < > 8.9* 8.7* 9.8* 9.2* 9.5*  HCT 25.4*  < > 26.1* 25.1* 28.4* 26.4* 27.3*  MCV 87.0  < > 86.1 86.3 85.8 87.1 88.1  PLT 205  < > 224 228 234 219 210  < > = values in this interval not displayed. Cardiac Enzymes:  Recent Labs Lab 05/14/15 0338  TROPONINI <0.03   BNP: Invalid input(s): POCBNP CBG:  Recent Labs Lab 05/19/15 1656 05/19/15 2203 05/20/15 0805 05/20/15 1133 05/20/15 1649  GLUCAP 229* 145* 107* 171* 223*    Recent Results (from the past 240 hour(s))  MRSA PCR Screening     Status: Abnormal   Collection Time: 05/14/15  7:18 AM  Result Value Ref Range Status   MRSA by PCR POSITIVE (A) NEGATIVE Final    Comment:        The GeneXpert MRSA Assay (FDA approved for NASAL specimens only), is one component of a comprehensive MRSA colonization surveillance program. It is not intended to diagnose MRSA infection nor to guide or monitor treatment for MRSA infections. RESULT CALLED TO, READ BACK BY AND VERIFIED WITH: H. MILLS RN 9:55 05/14/15 ([email or web address])      Scheduled Meds: . atorvastatin  20 mg Oral q1800  . budesonide-formoterol  2 puff Inhalation BID  . calcitRIOL  0.25 mcg Oral Daily  .  ceFAZolin (ANCEF) IV  1 g Intravenous To SS-Surg  .  ceFAZolin (ANCEF) IV  2 g Intravenous Once  . clonazePAM  1 mg Oral Once  . darbepoetin (ARANESP) injection - NON-DIALYSIS  200 mcg Subcutaneous Q Thu-1800  . doxazosin  4 mg Oral Daily  . [START ON 05/21/2015] enoxaparin (LOVENOX) injection  95 mg Subcutaneous Q24H  . insulin aspart  0-15 Units Subcutaneous TID WC  . insulin aspart  0-5 Units Subcutaneous QHS  . ipratropium-albuterol  3 mL Nebulization TID  . metoprolol  25 mg Oral BID  . predniSONE  20 mg Oral Q breakfast  . sodium chloride  500 mL Intravenous Once  . sodium chloride  3 mL Intravenous Q12H  . tiotropium  18 mcg Inhalation Daily   Continuous Infusions:    Kasey Hansell,  Lizet Kelso, DO  [facility] Pager [phone]  If 7PM-7AM, please contact night-coverage [email or web address] Password [email or web address] 05/20/2015, 7:48 PM   LOS: 6 days

## 2015-05-20 NOTE — Progress Notes (Signed)
   VASCULAR SURGERY ASSESSMENT & PLAN:  * The patient is scheduled for placement of new access in the right arm tomorrow. He is scheduled for dialysis tomorrow and I have asked that he be dialyzed in the afternoon so that it does not interfere with his surgery in the morning. Based on my office note, he appeared to be a good candidate for a right brachiocephalic AV fistula. Second choice would be a basilic vein transposition. If neither were adequate and he would require placement of an AV graft. He does have a functioning catheter.  SUBJECTIVE:   PHYSICAL EXAM:   LABS: Lab Results  Component Value Date   WBC 18.5* 05/20/2015   HGB 9.5* 05/20/2015   HCT 27.3* 05/20/2015   MCV 88.1 05/20/2015   PLT 210 05/20/2015   Lab Results  Component Value Date   CREATININE 5.08* 05/20/2015   Lab Results  Component Value Date   INR 1.28 05/20/2015   CBG (last 3)   Recent Labs  05/19/15 1656 05/19/15 2203 05/20/15 0805  GLUCAP 229* 145* 107*    [address] Beeper: [id number] 05/20/2015

## 2015-05-20 NOTE — Progress Notes (Signed)
Assessment/Plan: 1 CKD4-5 now ESRD, dialysis dependent. Much better breathing with 2 HD and vol/solute control. Stop diuretic Perm access planned for Tue.    Plan HD today and Tues.  Will need to CLIP for OP HD. 2 Anemia on esa/fe 3 HPTH on vit D 4 COPD better with less vol 5 CAD 6 DJD 7 CHF vol better P HD Today and in AM after access work  Subjective: Interval History:STill SOB, orthopne  Objective: Vital signs in last 24 hours: Temp:  [98.4 F (36.9 C)-98.7 F (37.1 C)] 98.7 F (37.1 C) (11/28 0652) Pulse Rate:  [85-93] 85 (11/28 0855) Resp:  [18-24] 18 (11/28 0652) BP: (121-189)/(59-158) 121/61 mmHg (11/28 0652) SpO2:  [92 %-97 %] 95 % (11/28 0908) Weight change:   Intake/Output from previous day: 11/27 0701 - 11/28 0700 In: 360 [P.O.:360] Out: -  Intake/Output this shift:    General appearance: alert and cooperative Resp: diminished breath sounds bilaterally Cardio: regular rate and rhythm, S1, S2 normal, no murmur, click, rub or gallop Extremities: edema 3+   Lab Results:  Recent Labs  05/19/15 0442 05/20/15 0718  WBC 13.8* 18.5*  HGB 9.2* 9.5*  HCT 26.4* 27.3*  PLT 219 210   BMET:  Recent Labs  05/19/15 0442 05/20/15 0718  NA 133* 131*  K 3.4* 3.1*  CL 94* 93*  CO2 28 26  GLUCOSE 130* 103*  BUN 64* 89*  CREATININE 3.88* 5.08*  CALCIUM 8.1* 8.3*   No results for input(s): PTH in the last 72 hours. Iron Studies: No results for input(s): IRON, TIBC, TRANSFERRIN, FERRITIN in the last 72 hours. Studies/Results: No results found.  Scheduled: . atorvastatin  20 mg Oral q1800  . budesonide-formoterol  2 puff Inhalation BID  . calcitRIOL  0.25 mcg Oral Daily  .  ceFAZolin (ANCEF) IV  1 g Intravenous To SS-Surg  .  ceFAZolin (ANCEF) IV  2 g Intravenous Once  . darbepoetin (ARANESP) injection - NON-DIALYSIS  200 mcg Subcutaneous Q Thu-1800  . doxazosin  4 mg Oral Daily  . [START ON 05/21/2015] enoxaparin (LOVENOX) injection  95 mg Subcutaneous  Q24H  . furosemide  160 mg Oral TID  . insulin aspart  0-15 Units Subcutaneous TID WC  . insulin aspart  0-5 Units Subcutaneous QHS  . ipratropium-albuterol  3 mL Nebulization TID  . metoprolol  25 mg Oral BID  . predniSONE  20 mg Oral Q breakfast  . sodium chloride  500 mL Intravenous Once  . sodium chloride  3 mL Intravenous Q12H  . tiotropium  18 mcg Inhalation Daily     LOS: 6 days   Dedra Matsuo C 05/20/2015,11:27 AM

## 2015-05-20 NOTE — H&P (View-Only) (Signed)
[facility] [place]  Patient name: Terry [name] MRN: [id number] DOB: February 28, 1935 Sex: male  REASON FOR CONSULT: Evaluate for hemodialysis access. Referred by Dr. Joelyn [name].   HPI: Terry [name] is a 80 y.o. male, who is is referred for evaluation for hemodialysis access. He is left-handed. He is not sure of the etiology of his chronic kidney disease. He denies any recent uremic symptoms. Specifically, he denies nausea, vomiting, fatigue, anorexia, or palpitations.  He has a history of atrial fibrillation and is on Eliquis for this. He does have a history of significant COPD. He's also had a TIA in the past associated with some speech issues and has no residual deficit. He had some mild left upper extremity weakness which completely resolved.  I have reviewed the records from [facility]. The patient has stage IV chronic kidney disease and hypertension. He has a history of COPD and is therefore a poor candidate for peritoneal dialysis. His blood pressure has been stable. He does have a history of a previous CVA with residual deficit.  Past Medical History  Diagnosis Date  . Stroke ([name]) 2012  . High cholesterol   . Hypertension   . Kidney disease     CKD4, sees [name], considering dialysis  . COPD (chronic obstructive pulmonary disease) ([place])   . CAD (coronary artery disease) [place], [name]  . Acute and chronic respiratory failure 10/22/2012  . Osteoarthritis 10/22/2012  . Pneumonia April 2014  . Atrial fibrillation ([facility])     Family History  Problem Relation Age of Onset  . Cancer Mother   . Heart disease Father   . Heart attack Father   . Cancer Sister   . Parkinson's disease Brother     SOCIAL HISTORY: Social History   Social History  . Marital Status: Married    Spouse Name: N/A  . Number of Children: 3  . Years of Education: N/A   Occupational History  . retired-worked in radio    Social History Main Topics  .  Smoking status: Former Smoker -- 1.00 packs/day for 49 years    Types: Cigarettes, Pipe, Cigars    Quit date: 02/21/2000  . Smokeless tobacco: Never Used  . Alcohol Use: No  . Drug Use: No  . Sexual Activity: Not on file   Other Topics Concern  . Not on file   Social History Narrative   Work or School: retired [employer] Situation: lives with wife in [place] Beliefs: [place] regular; poor diet             Allergies  Allergen Reactions  . Levaquin [Levofloxacin In D5w] Itching  . Yellow Dyes (Non-Tartrazine) Other (See Comments)    Burns arms    Current Outpatient Prescriptions  Medication Sig Dispense Refill  . amLODipine (NORVASC) 10 MG tablet TAKE ONE TABLET BY MOUTH ONCE DAILY 30 tablet 10  . atorvastatin (LIPITOR) 20 MG tablet Take 1 tablet (20 mg total) by mouth daily. 30 tablet 6  . budesonide-formoterol (SYMBICORT) 160-4.5 MCG/ACT inhaler Inhale 2 puffs into the lungs 2 (two) times daily. 1 Inhaler 6  . doxazosin (CARDURA) 4 MG tablet TAKE ONE TABLET BY MOUTH ONCE DAILY 30 tablet 5  . ELIQUIS 5 MG TABS tablet TAKE ONE TABLET BY MOUTH TWICE DAILY 60 tablet 5  . fluticasone-salmeterol (ADVAIR HFA) 115-21 MCG/ACT inhaler  Inhale 2 puffs into the lungs 2 (two) times daily.    . metoprolol (LOPRESSOR) 100 MG tablet Take 1 tablet (100 mg total) by mouth 2 (two) times daily. 180 tablet 3  . tiotropium (SPIRIVA HANDIHALER) 18 MCG inhalation capsule INHALE THE CONTENTS OF ONE CAPSULE INTO LUNGS ONCE DAILY 30 capsule 3  . torsemide (DEMADEX) 20 MG tablet Take 20 mg by mouth daily.     [name] albuterol (VENTOLIN HFA) 108 (90 BASE) MCG/ACT inhaler Inhale 2 puffs into the lungs every 4 (four) hours as needed for wheezing or shortness of breath. (Patient not taking: Reported on 05/01/2015) 1 Inhaler 1   No current facility-administered medications for this visit.    REVIEW OF SYSTEMS:  [X]  denotes positive finding, [ ]  denotes  negative finding Cardiac  Comments:  Chest pain or chest pressure:    Shortness of breath upon exertion: X   Short of breath when lying flat:    Irregular heart rhythm:        Vascular    Pain in calf, thigh, or hip brought on by ambulation:    Pain in feet at night that wakes you up from your sleep:     Blood clot in your veins:    Leg swelling:         Pulmonary    Oxygen at home:    Productive cough:     Wheezing:  X       Neurologic    Sudden weakness in arms or legs:     Sudden numbness in arms or legs:     Sudden onset of difficulty speaking or slurred speech:    Temporary loss of vision in one eye:     Problems with dizziness:         Gastrointestinal    Blood in stool:     Vomited blood:         Genitourinary    Burning when urinating:     Blood in urine:        Psychiatric    Major depression:         Hematologic    Bleeding problems:    Problems with blood clotting too easily:        Skin    Rashes or ulcers:        Constitutional    Fever or chills:      PHYSICAL EXAM: Filed Vitals:   05/01/15 1547 05/01/15 1600  BP: 169/57 163/62  Pulse: 63 63  Temp: 97.9 F (36.6 C)   TempSrc: Oral   Resp: 16   Height: 5\' 8"  (1.727 m)   Weight: 207 lb (93.895 kg)   SpO2: 96%     GENERAL: The patient is a well-nourished male, in no acute distress. The vital signs are documented above. CARDIAC: There is a regular rate and rhythm.  VASCULAR: I do not take carotid bruits. He has palpable radial pulses bilaterally. PULMONARY: There is good air exchange bilaterally without wheezing or rales. ABDOMEN: Soft and non-tender with normal pitched bowel sounds.  MUSCULOSKELETAL: There are no major deformities or cyanosis. NEUROLOGIC: No focal weakness or paresthesias are detected. SKIN: There are no ulcers or rashes noted. PSYCHIATRIC: The patient has a normal affect.  DATA:  GFR on 03/12/2015 was 14  UPPER EXTREMITY VEIN MAP: I have independently interpreted  his upper extremity vein map. On the right side his upper arm cephalic vein appears to be adequate for a fistula. Second choice would be a  basilic vein transposition on the right. He also appears to have similar anatomy in the left arm.  UPPER EXTREMITY ARTERIAL DUPLEX: Have independently interpreted his upper extremity arterial duplex. This shows triphasic Doppler signals in the radial and ulnar positions bilaterally.   MEDICAL ISSUES:  STAGE IV CHRONIC KIDNEY DISEASE: He appears to be a good candidate for a right brachiocephalic AV fistula. I can choice would be a basilic vein transposition. If neither were adequate he would require placement of an AV graft. I have explained the indications for placement of an AV fistula or AV graft. I've explained that if at all possible we will place an AV fistula.  I have reviewed the risks of placement of an AV fistula including but not limited to: failure of the fistula to mature, need for subsequent interventions, and thrombosis. In addition I have reviewed the potential complications of placement of an AV graft. These risks include, but are not limited to, graft thrombosis, graft infection, wound healing problems, bleeding, arm swelling, and steal syndrome. All the patient's questions were answered and they are agreeable to proceed with surgery. His surgery has been scheduled for 05/21/2015.   Deitra Mayo [facility]: [phone]

## 2015-05-21 ENCOUNTER — Inpatient Hospital Stay (HOSPITAL_COMMUNITY): Payer: Medicare PPO | Admitting: Certified Registered Nurse Anesthetist

## 2015-05-21 ENCOUNTER — Ambulatory Visit (HOSPITAL_COMMUNITY): Admission: RE | Admit: 2015-05-21 | Payer: Medicare PPO | Source: Ambulatory Visit | Admitting: Vascular Surgery

## 2015-05-21 ENCOUNTER — Encounter (HOSPITAL_COMMUNITY)
Admission: EM | Disposition: A | Discharge status: Skilled Nursing Facility | Payer: Self-pay | Source: Home / Self Care | Attending: Internal Medicine

## 2015-05-21 ENCOUNTER — Other Ambulatory Visit: Payer: Self-pay | Admitting: *Deleted

## 2015-05-21 ENCOUNTER — Telehealth: Payer: Self-pay | Admitting: Vascular Surgery

## 2015-05-21 ENCOUNTER — Encounter (HOSPITAL_COMMUNITY): Payer: Self-pay | Admitting: Certified Registered Nurse Anesthetist

## 2015-05-21 DIAGNOSIS — N186 End stage renal disease: Secondary | ICD-10-CM

## 2015-05-21 DIAGNOSIS — Z4931 Encounter for adequacy testing for hemodialysis: Secondary | ICD-10-CM

## 2015-05-21 HISTORY — PX: AV FISTULA PLACEMENT: SHX1204

## 2015-05-21 LAB — GLUCOSE, CAPILLARY
GLUCOSE-CAPILLARY: 116 mg/dL — AB (ref 65–99)
Glucose-Capillary: 107 mg/dL — ABNORMAL HIGH (ref 65–99)
Glucose-Capillary: 162 mg/dL — ABNORMAL HIGH (ref 65–99)
Glucose-Capillary: 145 mg/dL — ABNORMAL HIGH (ref 65–99)

## 2015-05-21 LAB — SURGICAL PCR SCREEN
MRSA, PCR: POSITIVE — AB
Staphylococcus aureus: POSITIVE — AB

## 2015-05-21 LAB — CBC
HEMATOCRIT: 28.8 % — AB (ref 39.0–52.0)
HEMOGLOBIN: 9.8 g/dL — AB (ref 13.0–17.0)
MCH: 30.1 pg (ref 26.0–34.0)
MCHC: 34 g/dL (ref 30.0–36.0)
MCV: 88.3 fL (ref 78.0–100.0)
Platelets: 223 10*3/uL (ref 150–400)
RBC: 3.26 MIL/uL — ABNORMAL LOW (ref 4.22–5.81)
RDW: 13.9 % (ref 11.5–15.5)
WBC: 22.8 10*3/uL — ABNORMAL HIGH (ref 4.0–10.5)

## 2015-05-21 LAB — POCT I-STAT 4, (NA,K, GLUC, HGB,HCT)
GLUCOSE: 121 mg/dL — AB (ref 65–99)
HCT: 29 % — ABNORMAL LOW (ref 39.0–52.0)
HEMOGLOBIN: 9.9 g/dL — AB (ref 13.0–17.0)
POTASSIUM: 3.7 mmol/L (ref 3.5–5.1)
Sodium: 131 mmol/L — ABNORMAL LOW (ref 135–145)

## 2015-05-21 LAB — PROTIME-INR
INR: 1.06 (ref 0.00–1.49)
PROTHROMBIN TIME: 14 s (ref 11.6–15.2)

## 2015-05-21 SURGERY — ARTERIOVENOUS (AV) FISTULA CREATION
Anesthesia: Monitor Anesthesia Care | Site: Arm Lower | Laterality: Right

## 2015-05-21 MED ORDER — ENOXAPARIN SODIUM 100 MG/ML ~~LOC~~ SOLN
95.0000 mg | SUBCUTANEOUS | Status: DC
  Administered 2015-05-22: 95 mg via SUBCUTANEOUS
  Filled 2015-05-21: qty 1

## 2015-05-21 MED ORDER — LIDOCAINE HCL (PF) 1 % IJ SOLN
INTRAMUSCULAR | Status: AC
  Filled 2015-05-21: qty 30

## 2015-05-21 MED ORDER — OXYCODONE HCL 5 MG PO TABS
5.0000 mg | ORAL_TABLET | Freq: Four times a day (QID) | ORAL | Status: DC | PRN
  Administered 2015-05-22: 5 mg via ORAL

## 2015-05-21 MED ORDER — PROTAMINE SULFATE 10 MG/ML IV SOLN
INTRAVENOUS | Status: DC | PRN
Start: 2015-05-21 — End: 2015-05-21
  Administered 2015-05-21: 10 mg via INTRAVENOUS
  Administered 2015-05-21: 30 mg via INTRAVENOUS

## 2015-05-21 MED ORDER — CHLORHEXIDINE GLUCONATE CLOTH 2 % EX PADS
6.0000 | MEDICATED_PAD | Freq: Once | CUTANEOUS | Status: AC
  Administered 2015-05-21: 6 via TOPICAL

## 2015-05-21 MED ORDER — LIDOCAINE HCL (PF) 1 % IJ SOLN
INTRAMUSCULAR | Status: DC | PRN
Start: 2015-05-21 — End: 2015-05-21
  Administered 2015-05-21: 30 mL

## 2015-05-21 MED ORDER — LIDOCAINE HCL (CARDIAC) 20 MG/ML IV SOLN
INTRAVENOUS | Status: DC | PRN
  Administered 2015-05-21: 60 mg via INTRAVENOUS

## 2015-05-21 MED ORDER — FENTANYL CITRATE (PF) 100 MCG/2ML IJ SOLN: 25.0000 ug | INTRAMUSCULAR | Status: DC | PRN

## 2015-05-21 MED ORDER — ONDANSETRON HCL 4 MG/2ML IJ SOLN
INTRAMUSCULAR | Status: DC | PRN
  Administered 2015-05-21: 4 mg via INTRAVENOUS

## 2015-05-21 MED ORDER — PROMETHAZINE HCL 25 MG/ML IJ SOLN: 6.2500 mg | INTRAMUSCULAR | Status: DC | PRN

## 2015-05-21 MED ORDER — HEPARIN SODIUM (PORCINE) 1000 UNIT/ML IJ SOLN
INTRAMUSCULAR | Status: DC | PRN
  Administered 2015-05-21: 7000 [IU] via INTRAVENOUS

## 2015-05-21 MED ORDER — SODIUM CHLORIDE 0.9 % IV SOLN
INTRAVENOUS | Status: DC
  Administered 2015-05-21: 10:00:00 via INTRAVENOUS

## 2015-05-21 MED ORDER — PHENYLEPHRINE HCL 10 MG/ML IJ SOLN
INTRAMUSCULAR | Status: DC | PRN
  Administered 2015-05-21: 120 ug via INTRAVENOUS
  Administered 2015-05-21 (×2): 80 ug via INTRAVENOUS
  Administered 2015-05-21: 40 ug via INTRAVENOUS
  Administered 2015-05-21: 80 ug via INTRAVENOUS

## 2015-05-21 MED ORDER — 0.9 % SODIUM CHLORIDE (POUR BTL) OPTIME
TOPICAL | Status: DC | PRN
  Administered 2015-05-21: 1000 mL

## 2015-05-21 MED ORDER — FENTANYL CITRATE (PF) 100 MCG/2ML IJ SOLN
INTRAMUSCULAR | Status: DC | PRN
  Administered 2015-05-21: 25 ug via INTRAVENOUS

## 2015-05-21 MED ORDER — THROMBIN 20000 UNITS EX SOLR
CUTANEOUS | Status: AC
  Filled 2015-05-21: qty 20000

## 2015-05-21 MED ORDER — PROPOFOL 500 MG/50ML IV EMUL
INTRAVENOUS | Status: DC | PRN
  Administered 2015-05-21: 60 ug/kg/min via INTRAVENOUS

## 2015-05-21 MED ORDER — PHENYLEPHRINE HCL 10 MG/ML IJ SOLN
10.0000 mg | INTRAVENOUS | Status: DC | PRN
  Administered 2015-05-21: 20 ug/min via INTRAVENOUS

## 2015-05-21 MED ORDER — SODIUM CHLORIDE 0.9 % IV SOLN
INTRAVENOUS | Status: DC | PRN
  Administered 2015-05-21: 500 mL

## 2015-05-21 MED ORDER — CLONAZEPAM 1 MG PO TABS
1.0000 mg | ORAL_TABLET | Freq: Once | ORAL | Status: AC
  Administered 2015-05-22: 1 mg via ORAL
  Filled 2015-05-21: qty 1

## 2015-05-21 SURGICAL SUPPLY — 31 items
ARMBAND PINK RESTRICT EXTREMIT (MISCELLANEOUS) ×2 IMPLANT
CANISTER SUCTION 2500CC (MISCELLANEOUS) ×2 IMPLANT
CANNULA VESSEL 3MM 2 BLNT TIP (CANNULA) ×2 IMPLANT
CLIP TI MEDIUM 6 (CLIP) ×2 IMPLANT
CLIP TI WIDE RED SMALL 6 (CLIP) ×4 IMPLANT
COVER PROBE W GEL 5X96 (DRAPES) IMPLANT
DECANTER SPIKE VIAL GLASS SM (MISCELLANEOUS) ×2 IMPLANT
ELECT REM PT RETURN 9FT ADLT (ELECTROSURGICAL) ×2
ELECTRODE REM PT RTRN 9FT ADLT (ELECTROSURGICAL) ×1 IMPLANT
GLOVE BIO SURGEON STRL SZ 6.5 (GLOVE) ×4 IMPLANT
GLOVE BIO SURGEON STRL SZ7.5 (GLOVE) ×2 IMPLANT
GLOVE BIOGEL PI IND STRL 6.5 (GLOVE) ×1 IMPLANT
GLOVE BIOGEL PI IND STRL 8 (GLOVE) ×2 IMPLANT
GLOVE BIOGEL PI INDICATOR 6.5 (GLOVE) ×1
GLOVE BIOGEL PI INDICATOR 8 (GLOVE) ×2
GLOVE ECLIPSE 6.5 STRL STRAW (GLOVE) ×4 IMPLANT
GOWN STRL REUS W/ TWL LRG LVL3 (GOWN DISPOSABLE) ×3 IMPLANT
GOWN STRL REUS W/TWL LRG LVL3 (GOWN DISPOSABLE) ×3
KIT BASIN OR (CUSTOM PROCEDURE TRAY) ×2 IMPLANT
KIT ROOM TURNOVER OR (KITS) ×2 IMPLANT
LIQUID BAND (GAUZE/BANDAGES/DRESSINGS) ×2 IMPLANT
NS IRRIG 1000ML POUR BTL (IV SOLUTION) ×2 IMPLANT
PACK CV ACCESS (CUSTOM PROCEDURE TRAY) ×2 IMPLANT
PAD ARMBOARD 7.5X6 YLW CONV (MISCELLANEOUS) ×4 IMPLANT
SPONGE SURGIFOAM ABS GEL 100 (HEMOSTASIS) IMPLANT
SUT PROLENE 6 0 BV (SUTURE) ×2 IMPLANT
SUT VIC AB 3-0 SH 27 (SUTURE) ×1
SUT VIC AB 3-0 SH 27X BRD (SUTURE) ×1 IMPLANT
SUT VICRYL 4-0 PS2 18IN ABS (SUTURE) ×2 IMPLANT
UNDERPAD 30X30 INCONTINENT (UNDERPADS AND DIAPERS) ×2 IMPLANT
WATER STERILE IRR 1000ML POUR (IV SOLUTION) ×2 IMPLANT

## 2015-05-21 NOTE — Care Management Note (Addendum)
Case Management Note  Patient Details  Name: Terry [name] MRN: [id number] Date of Birth: [birth year]  Subjective/Objective:   Date: 05/21/15 Spoke with patient at the bedside along with spouse.  Introduced self as Tourist information centre manager and explained role in discharge planning and how to be reached.  Verified patient lives in [name], alone with spouse.   Expressed potential need for no other DME.  Verified patient anticipates to go home with family,at time of discharge and will have full-time  supervision by family friends neighbors at this time to best of their knowledge.  Wife denied needing help with their medication.  Patient is driven by wife to MD appointments.  Verified patient has PCP Dr. Maudie Mercury.  Patient with ESRD for AV fistula today, and patient will need to be clipped.  Charlett Nose in HD is starting clipping today.  Plan: CM will continue to follow for discharge planning and [facility] resources.                  Action/Plan:   Expected Discharge Date:                  Expected Discharge Plan:  [place]  In-House Referral:     Discharge planning Services  CM Consult  Post Acute Care Choice:    Choice offered to:     DME Arranged:    DME Agency:     HH Arranged:    [place] Agency:     Status of Service:  In process, will continue to follow  Medicare Important Message Given:  Yes Date Medicare IM Given:    Medicare IM give by:    Date Additional Medicare IM Given:    Additional Medicare Important Message give by:     If discussed at [place] of Stay Meetings, dates discussed:    Additional Comments:  Zenon Mayo, RN 05/21/2015, 12:38 PM

## 2015-05-21 NOTE — Progress Notes (Signed)
PROGRESS NOTE  Terry [name] [id number] DOB: 1934/08/08 DOA: 05/13/2015 PCP: [name], DO  Brief history 80 year old male with a history of COPD, hypertension, CAD/CABG, CKD stage IV, atrial fibrillation diagnosed October 2015 on apixaban presented with one-week history of worsening cough and shortness of breath. The patient was seen by his primary care provider 4 days prior to admission and prescribed Augmentin and prednisone without improvement. He presented to the emergency department where he was noted to have oxygen saturation of 80% on room air and found to be in respiratory distress. The patient was started on doxycycline and Solu-Medrol. The patient has also been describing symptoms of orthopnea and PND with increasing abdominal girth and lower extremity edema for the past week prior to admission. After admission, the patient was started on intravenous steroids. His breathing did not improve. He subsequently started on intravenous furosemide for volume excess. Ultimately, the patient was declared ESRD and dialysis catheter was placed and dialysis initiated on 05/17/2015. Assessment/Plan: Acute respiratory failure with hypoxia -Secondary to CHF and COPD -Presently stable on 3 L nasal cannula -Wean oxygen for saturation greater than 92% -Pulmonary hygiene Acute diastolic CHF  -remains clinically volume overloaded  -furosemide d/c now that pt is on HD -05/16/2015 Echocardiogram--EF 55-60%, trivial MR/TR -Daily weights--question accuracy -Previous Dry weight is approximately 203 pounds -11/28--now manage fluid with HD Acute on chronic renal failure (CKD 4-5)-->ESRD -Previous Baseline creatinine 3.2-3.4 -Appreciate nephrology-->plans for perm-cath -Previously scheduled for fistula creation 05/21/2015 -05/17/2015--PermCath placed by radiology -[id number] brachiocephalic AVF -[id number] HD--klonopin 1 mg prior to HD for anxiety and cramping COPD exacerbation   -Patient has >60 pk years -overall improved -wean prednisone--last dose given on 11/29 -Pulmonary hygiene  -Continue bronchodilators --pt intermittenly refusing -Wean oxygen for saturation greater than 92% --likely will need home oxygen Atrial fibrillation  -CHADS-VASc = 6 -Discontinue apixaban due to worsening renal function -d/c heparin and switch to lovenox per renal recommendations -restart coumadin when procedures are done--fistula creation planned 11/29 -Continue metoprolol tartrate--rate controlled -restart coumadin with lovenox bridge on 05/22/15--will have to go home with lovenox bridge -05/16/15--vitamin K given 5mg  Hypertension -Continue metoprolol tartrate -decrease metoprolol tartrate as BP decreases with HD-->d/c cardura Diabetes mellitus type 2 -Check hemoglobin A1c--6.6 -pt agreeable to start po meds upon d/c -NovoLog sliding scale Coronary artery disease -Cardiac cath 11/30/1997. Mild distal Left main stenosis, 25% proximal LAD, minor irregularities Circumflex, 95% mid RCA stenosis treated with 3.5 x 32 mm bare metal stent -no angina presently Hyponatremia -Secondary to worsening renal function and fluid overload -improved with HD  Leukocytosis -due to steroids -afebrile and hemodynamically stable  Family Communication: Wife updated at beside 11/29 Disposition Plan: CLIP process prior to d/c home       Procedures/Studies: Dg Chest 2 View  05/13/2015  CLINICAL DATA:  80 year old with 1 week history of productive cough and severe shortness of breath. Former smoker with current history of COPD. EXAM: CHEST  2 VIEW COMPARISON:  05/28/2014 and earlier. FINDINGS: AP erect and lateral imaging was performed. Cardiac silhouette mildly enlarged with a right coronary artery stent. Thoracic aorta atherosclerotic, unchanged. Prominent central pulmonary arteries, unchanged. Scarring in the lower lobes, right greater than left, unchanged. Hyperinflation,  emphysematous changes in the upper lobes, and mild central peribronchial thickening, unchanged. No new pulmonary parenchymal abnormalities. Degenerative disc disease and spondylosis involving the thoracic spine. IMPRESSION: COPD/emphysema. Scarring in the lower lobes, right greater than left. No acute cardiopulmonary disease. Stable  examination. Electronically Signed   By: Evangeline Dakin M.D.   On: 05/13/2015 21:03   US Renal  05/15/2015  CLINICAL DATA:  Acute renal injury EXAM: RENAL / URINARY TRACT ULTRASOUND COMPLETE COMPARISON:  10/24/2014 FINDINGS: Right Kidney: Length: 14.8 cm. Multiple cysts are noted. The largest of these measures 2.2 cm in greatest dimension. Left Kidney: Length: 14.0 cm. Multiple cysts are noted. The largest of these measures 3.1 cm in greatest dimension. Bladder: Appears normal for degree of bladder distention. IMPRESSION: Bilateral renal cysts similar to that seen on prior MRI examination. No acute obstructive changes are noted. Electronically Signed   By: Inez Catalina M.D.   On: 05/15/2015 16:29   Ir Fluoro Guide Cv Line Right  05/17/2015  INDICATION: Chronic kidney disease with early uremia. Need to start hemodialysis. EXAM: FLUOROSCOPIC AND ULTRASOUND GUIDED PLACEMENT OF A TUNNELED DIALYSIS CATHETER Physician: Stephan Minister. Henn, MD FLUOROSCOPY TIME:  42 seconds, 13.2 mGy MEDICATIONS: 2 g Ancef. As antibiotic prophylaxis, Ancef was ordered pre-procedure and administered intravenously within one hour of incision. 0.5 mg versed, 25 mcg fentanyl. A radiology nurse monitored the patient for moderate sedation. ANESTHESIA/SEDATION: Moderate sedation time: 30 minutes PROCEDURE: Informed consent was obtained for placement of a tunneled dialysis catheter. The patient was placed supine on the interventional table. Ultrasound confirmed a patent right internal jugularvein. Ultrasound images were obtained for documentation. The right side of the neck was prepped and draped in a sterile  fashion. The right side of the neck was anesthetized with 1% lidocaine. Maximal barrier sterile technique was utilized including caps, mask, sterile gowns, sterile gloves, sterile drape, hand hygiene and skin antiseptic. A small incision was made with #11 blade scalpel. A 21 gauge needle directed into the right internal jugular vein with ultrasound guidance. A micropuncture dilator set was placed. A 23 cm tip to cuff Palindrome catheter was selected. The skin below the right clavicle was anesthetized and a small incision was made with an #11 blade scalpel. A subcutaneous tunnel was formed to the vein dermatotomy site. The catheter was brought through the tunnel. The vein dermatotomy site was dilated to accommodate a peel-away sheath. The catheter was placed through the peel-away sheath and directed into the central venous structures. The tip of the catheter was placed at the superior cavoatrial junction with fluoroscopy. Fluoroscopic images were obtained for documentation. Both lumens were found to aspirate and flush well. The proper amount of heparin was flushed in both lumens. The vein dermatotomy site was closed using a single layer of absorbable suture and Dermabond. The catheter was secured to the skin using Prolene suture. FINDINGS: Catheter tip at the superior cavoatrial junction. Estimated blood loss: Minimal COMPLICATIONS: None IMPRESSION: Successful placement of a right jugular tunneled dialysis catheter using ultrasound and fluoroscopic guidance. Electronically Signed   By: Markus Daft M.D.   On: 05/17/2015 16:23   Ir US Guide Vasc Access Right  05/17/2015  INDICATION: Chronic kidney disease with early uremia. Need to start hemodialysis. EXAM: FLUOROSCOPIC AND ULTRASOUND GUIDED PLACEMENT OF A TUNNELED DIALYSIS CATHETER Physician: Stephan Minister. Henn, MD FLUOROSCOPY TIME:  42 seconds, 13.2 mGy MEDICATIONS: 2 g Ancef. As antibiotic prophylaxis, Ancef was ordered pre-procedure and administered intravenously  within one hour of incision. 0.5 mg versed, 25 mcg fentanyl. A radiology nurse monitored the patient for moderate sedation. ANESTHESIA/SEDATION: Moderate sedation time: 30 minutes PROCEDURE: Informed consent was obtained for placement of a tunneled dialysis catheter. The patient was placed supine on the interventional table. Ultrasound confirmed a  patent right internal jugularvein. Ultrasound images were obtained for documentation. The right side of the neck was prepped and draped in a sterile fashion. The right side of the neck was anesthetized with 1% lidocaine. Maximal barrier sterile technique was utilized including caps, mask, sterile gowns, sterile gloves, sterile drape, hand hygiene and skin antiseptic. A small incision was made with #11 blade scalpel. A 21 gauge needle directed into the right internal jugular vein with ultrasound guidance. A micropuncture dilator set was placed. A 23 cm tip to cuff Palindrome catheter was selected. The skin below the right clavicle was anesthetized and a small incision was made with an #11 blade scalpel. A subcutaneous tunnel was formed to the vein dermatotomy site. The catheter was brought through the tunnel. The vein dermatotomy site was dilated to accommodate a peel-away sheath. The catheter was placed through the peel-away sheath and directed into the central venous structures. The tip of the catheter was placed at the superior cavoatrial junction with fluoroscopy. Fluoroscopic images were obtained for documentation. Both lumens were found to aspirate and flush well. The proper amount of heparin was flushed in both lumens. The vein dermatotomy site was closed using a single layer of absorbable suture and Dermabond. The catheter was secured to the skin using Prolene suture. FINDINGS: Catheter tip at the superior cavoatrial junction. Estimated blood loss: Minimal COMPLICATIONS: None IMPRESSION: Successful placement of a right jugular tunneled dialysis catheter using  ultrasound and fluoroscopic guidance. Electronically Signed   By: Markus Daft M.D.   On: 05/17/2015 16:23         Subjective: Patient still has some orthopnea type symptoms. Has some dyspnea on exertion. Denies any fevers, chills, chest pain, nausea, vomiting, diarrhea, abdominal pain, rashes, headaches.  Objective: Filed Vitals:   05/21/15 0851 05/21/15 1200 05/21/15 1212 05/21/15 1227  BP: 130/51 114/56 125/56 102/45  Pulse: 101 87 84 81  Temp: 98.3 F (36.8 C) 98.5 F (36.9 C) 98.4 F (36.9 C) 98.4 F (36.9 C)  TempSrc: Oral   Oral  Resp:  19 18   Height:      Weight:      SpO2: 92% 91% 92% 89%    Intake/Output Summary (Last 24 hours) at 05/21/15 1821 Last data filed at 05/21/15 1742  Gross per 24 hour  Intake    300 ml  Output   2000 ml  Net  -1700 ml   Weight change:  Exam:   General:  Pt is alert, follows commands appropriately, not in acute distress  HEENT: No icterus, No thrush, No neck mass, [place]/AT  Cardiovascular: RRR, S1/S2, no rubs, no gallops  Respiratory: Scattered bilateral rales without any wheezing.  Abdomen: Soft/+BS, non tender, non distended, no guarding; no hepatosplenomegaly  Extremities: 2+ LE edema, No lymphangitis, No petechiae, No rashes, no synovitis; living without any cyanosis  Data Reviewed: Basic Metabolic Panel:  Recent Labs Lab 05/16/15 0248 05/17/15 0414 05/17/15 2156 05/18/15 1835 05/19/15 0442 05/20/15 0718 05/21/15 0940  NA 128* 127* 124* 127* 133* 131* 131*  K 3.9 3.4* 3.2* 3.3* 3.4* 3.1* 3.7  CL 95* 93* 89* 91* 94* 93*  --   CO2 20* 20* 21* 24 28 26   --   GLUCOSE 185* 160* 155* 203* 130* 103* 121*  BUN 109* 121* 127* 92* 64* 89*  --   CREATININE 5.51* 5.88* 5.99* 4.67* 3.88* 5.08*  --   CALCIUM 7.8* 7.6* 7.3* 7.5* 8.1* 8.3*  --   MG 1.9  --   --   --   --   --   --  PHOS 7.1* 8.2* 8.2* 6.2*  --  7.1*  --    Liver Function Tests:  Recent Labs Lab 05/16/15 0248 05/17/15 0414 05/17/15 2156  05/18/15 1835 05/20/15 0718  AST 18  --   --   --   --   ALT 22  --   --   --   --   ALKPHOS 38  --   --   --   --   BILITOT 0.5  --   --   --   --   PROT 5.7*  --   --   --   --   ALBUMIN 3.0* 3.1* 3.0* 3.0* 3.1*   No results for input(s): LIPASE, AMYLASE in the last 168 hours. No results for input(s): AMMONIA in the last 168 hours. CBC:  Recent Labs Lab 05/15/15 0017  05/17/15 2157 05/18/15 0535 05/19/15 0442 05/20/15 0718 05/21/15 0534 05/21/15 0940  WBC 12.3*  < > 12.9* 15.4* 13.8* 18.5* 22.8*  --   NEUTROABS 11.5*  --   --   --   --   --   --   --   HGB 8.8*  < > 8.7* 9.8* 9.2* 9.5* 9.8* 9.9*  HCT 25.4*  < > 25.1* 28.4* 26.4* 27.3* 28.8* 29.0*  MCV 87.0  < > 86.3 85.8 87.1 88.1 88.3  --   PLT 205  < > 228 234 219 210 223  --   < > = values in this interval not displayed. Cardiac Enzymes: No results for input(s): CKTOTAL, CKMB, CKMBINDEX, TROPONINI in the last 168 hours. BNP: Invalid input(s): POCBNP CBG:  Recent Labs Lab 05/20/15 1649 05/20/15 2134 05/21/15 0812 05/21/15 1250 05/21/15 1648  GLUCAP 223* 154* 107* 116* 162*    Recent Results (from the past 240 hour(s))  MRSA PCR Screening     Status: Abnormal   Collection Time: 05/14/15  7:18 AM  Result Value Ref Range Status   MRSA by PCR POSITIVE (A) NEGATIVE Final    Comment:        The GeneXpert MRSA Assay (FDA approved for NASAL specimens only), is one component of a comprehensive MRSA colonization surveillance program. It is not intended to diagnose MRSA infection nor to guide or monitor treatment for MRSA infections. RESULT CALLED TO, READ BACK BY AND VERIFIED WITH: H. MILLS RN 9:55 05/14/15 ([email or web address])   Surgical pcr screen     Status: Abnormal   Collection Time: 05/21/15  5:09 AM  Result Value Ref Range Status   MRSA, PCR POSITIVE (A) NEGATIVE Final   Staphylococcus aureus POSITIVE (A) NEGATIVE Final    Comment:        The Xpert SA Assay (FDA approved for NASAL specimens in patients  over 8 years of age), is one component of a comprehensive surveillance program.  Test performance has been validated by [facility] for patients greater than or equal to 70 year old. It is not intended to diagnose infection nor to guide or monitor treatment.      Scheduled Meds: . atorvastatin  20 mg Oral q1800  . budesonide-formoterol  2 puff Inhalation BID  . calcitRIOL  0.25 mcg Oral Daily  . clonazePAM  1 mg Oral Once  . darbepoetin (ARANESP) injection - NON-DIALYSIS  200 mcg Subcutaneous Q Thu-1800  . [START ON 05/22/2015] enoxaparin (LOVENOX) injection  95 mg Subcutaneous Q24H  . insulin aspart  0-15 Units Subcutaneous TID WC  . insulin aspart  0-5 Units Subcutaneous QHS  . ipratropium-albuterol  3 mL Nebulization TID  . metoprolol  25 mg Oral BID  . sodium chloride  500 mL Intravenous Once  . sodium chloride  3 mL Intravenous Q12H  . tiotropium  18 mcg Inhalation Daily   Continuous Infusions: . sodium chloride 10 mL/hr at 05/21/15 0946     Aldeen Riga, DO  [facility] Pager [phone]  If 7PM-7AM, please contact night-coverage [email or web address] Password [email or web address] 05/21/2015, 6:21 PM   LOS: 7 days

## 2015-05-21 NOTE — Op Note (Signed)
    NAME: Terry [name]   MRN: [id number] DOB: 29-Jul-1934    DATE OF OPERATION: 05/21/2015  PREOP DIAGNOSIS: Stage V chronic kidney disease  POSTOP DIAGNOSIS: Same  PROCEDURE: Right brachiocephalic AV fistula  SURGEON: Judeth Cornfield. Scot Dock, MD, FACS  ASSIST: Leontine Locket, PA  ANESTHESIA: local with sedation   EBL: minimal  INDICATIONS: [name] is a 80 y.o. male who is currently on dialysis via a catheter. He presents for new access.  FINDINGS: 4 mm right upper arm cephalic vein.  TECHNIQUE: The patient was taken to the operating room and received sedation. The right upper extremity was prepped and draped in usual sterile fashion. After the skin was anesthetized with lidocaine, a transverse incision was made just above the antecubital level. Here the cephalic vein was dissected free. It was dissected down to where the vein had been cannulated multiple times the antecubital level. The brachial artery was dissected free beneath the fascia. The patient was heparinized. The vein was ligated distally and then irrigated out with heparinized saline. It was a good size vein about 4 mm. The brachial artery was clamped proximally and distally and a longitudinal arteriotomy was made. The vein was mobilized over and sewn end to side of the artery using continuous 6-0 prolene suture. There was slight tension on the vein and therefore i dissected it free and ligated a lateral branch to take tension off of the fistula. There was an excellent thrill in the fistula. There was a palpable radial pulse. Hemostasis was obtained in the wound. The wound was closed deeply with 3-0 vicryl and skin closed with 4-0 vicryl. dermabond was applied. The patient tolerated the procedure well and was transferred to recovery room in stable condition. All needle and sponge counts were correct.  Deitra Mayo, MD, FACS [facility]  DATE OF DICTATION:   05/21/2015

## 2015-05-21 NOTE — Telephone Encounter (Signed)
LM for pt regarding appt, dpm

## 2015-05-21 NOTE — Anesthesia Postprocedure Evaluation (Signed)
Anesthesia Post Note  Patient: Terry [name]  Procedure(s) Performed: Procedure(s) (LRB): Right Arm Brachiocephalic ARTERIOVENOUS (AV) FISTULA CREATION (Right)  Patient location during evaluation: PACU Anesthesia Type: MAC Level of consciousness: awake and alert Pain management: pain level controlled Vital Signs Assessment: post-procedure vital signs reviewed and stable Respiratory status: spontaneous breathing, respiratory function stable and patient connected to nasal cannula oxygen Cardiovascular status: blood pressure returned to baseline Postop Assessment: no signs of nausea or vomiting Anesthetic complications: no    Last Vitals:  Filed Vitals:   05/21/15 1212 05/21/15 1227  BP: 125/56 102/45  Pulse: 84 81  Temp: 36.9 C 36.9 C  Resp: 18     Last Pain:  Filed Vitals:   05/21/15 1227  PainSc: 0-No pain                 Tiajuana Amass

## 2015-05-21 NOTE — Care Management Important Message (Signed)
Important Message  Patient Details  Name: LAYLA EVARISTO MRN: [id number] Date of Birth: July 30, 1934   Medicare Important Message Given:  Yes    Zenon Mayo, RN 05/21/2015, 12:37 [place] Message  Patient Details  Name: HATTIE KLEPACKI MRN: [id number] Date of Birth: 1934/06/24   Medicare Important Message Given:  Yes    Zenon Mayo, RN 05/21/2015, 12:37 PM

## 2015-05-21 NOTE — Progress Notes (Signed)
Transported pt. To Short Stay. Removed tele box and placed SCDs at end of bed. Assessed vitals and adminstered Metoprolol (Beta Blocker) CHG bath completed and consent placed in chart. Pt. NPO since midnight. Gave report to receiving nurse. No further needs noted at this time.

## 2015-05-21 NOTE — Transfer of Care (Signed)
Immediate Anesthesia Transfer of Care Note  Patient: Terry [name]  Procedure(s) Performed: Procedure(s): Right Arm Brachiocephalic ARTERIOVENOUS (AV) FISTULA CREATION (Right)  Patient Location: PACU  Anesthesia Type:MAC  Level of Consciousness: awake, alert , oriented and patient cooperative  Airway & Oxygen Therapy: Patient Spontanous Breathing and Patient connected to nasal cannula oxygen  Post-op Assessment: Report given to RN and Post -op Vital signs reviewed and stable  Post vital signs: Reviewed and stable  Last Vitals:  Filed Vitals:   05/21/15 0615 05/21/15 0851  BP: 106/58 130/51  Pulse: 96 101  Temp: 37.1 C 36.8 C  Resp: 22     Complications: No apparent anesthesia complications

## 2015-05-21 NOTE — Progress Notes (Addendum)
Pt. Back from OR. Fistula ++ in right arm. Vitals stable. Pt. Now on 4L of O2 due to SOB.  Wife at bedside. Educated wife on Contact percautions and reasoning for putting on gowns. Pt. Wife states that she understood, but was not going to put a gown on. Will continue to monitor.

## 2015-05-21 NOTE — Progress Notes (Signed)
Assessment/Plan: 1 New start ESRD, dialysis dependent. S/p AVF RBC today Dr. Scot Dock 2 Anemia on esa/fe 3 HPTH on vit D 4 COPD  5 CAD 6 DJD 7 CHF/Vol overload  P HD again for volume today and Wed.  Subjective: Interval History: -2000cc last PM/AM  Objective: Vital signs in last 24 hours: Temp:  [97.8 F (36.6 C)-98.7 F (37.1 C)] 98.4 F (36.9 C) (11/29 1227) Pulse Rate:  [77-108] 81 (11/29 1227) Resp:  [16-22] 18 (11/29 1212) BP: (95-139)/(45-98) 102/45 mmHg (11/29 1227) SpO2:  [89 %-95 %] 89 % (11/29 1227) Weight:  [91.4 kg (201 lb 8 oz)-95.3 kg (210 lb 1.6 oz)] 91.4 kg (201 lb 8 oz) (11/29 0615) Weight change:   Intake/Output from previous day: 11/28 0701 - 11/29 0700 In: 598 [P.O.:598] Out: 2000  Intake/Output this shift: Total I/O In: 300 [I.V.:300] Out: -   General appearance: alert, cooperative and mild distress Resp: diminished breath sounds bilaterally Chest wall: no tenderness, tr presacral edema Cardio: regular rate and rhythm, S1, S2 normal, no murmur, click, rub or gallop Extremities: edema 3+ tight  Lab Results:  Recent Labs  05/20/15 0718 05/21/15 0534 05/21/15 0940  WBC 18.5* 22.8*  --   HGB 9.5* 9.8* 9.9*  HCT 27.3* 28.8* 29.0*  PLT 210 223  --    BMET:  Recent Labs  05/19/15 0442 05/20/15 0718 05/21/15 0940  NA 133* 131* 131*  K 3.4* 3.1* 3.7  CL 94* 93*  --   CO2 28 26  --   GLUCOSE 130* 103* 121*  BUN 64* 89*  --   CREATININE 3.88* 5.08*  --   CALCIUM 8.1* 8.3*  --    No results for input(s): PTH in the last 72 hours. Iron Studies: No results for input(s): IRON, TIBC, TRANSFERRIN, FERRITIN in the last 72 hours. Studies/Results: No results found.  Scheduled: . atorvastatin  20 mg Oral q1800  . budesonide-formoterol  2 puff Inhalation BID  . calcitRIOL  0.25 mcg Oral Daily  . darbepoetin (ARANESP) injection - NON-DIALYSIS  200 mcg Subcutaneous Q Thu-1800  . doxazosin  4 mg Oral Daily  . [START ON 05/22/2015] enoxaparin  (LOVENOX) injection  95 mg Subcutaneous Q24H  . insulin aspart  0-15 Units Subcutaneous TID WC  . insulin aspart  0-5 Units Subcutaneous QHS  . ipratropium-albuterol  3 mL Nebulization TID  . metoprolol  25 mg Oral BID  . predniSONE  20 mg Oral Q breakfast  . sodium chloride  500 mL Intravenous Once  . sodium chloride  3 mL Intravenous Q12H  . tiotropium  18 mcg Inhalation Daily     LOS: 7 days   [name] C 05/21/2015,3:03 PM

## 2015-05-21 NOTE — Telephone Encounter (Signed)
-----   Message from Mena Goes, RN sent at 05/21/2015 12:45 PM EST ----- Regarding: schedule   ----- Message -----    From: Gabriel Earing, PA-C    Sent: 05/21/2015  11:43 AM      To: [facility]vs Charge Pool  S/p right BC AVF 05/21/15.  F/u with CSD in 6 weeks with duplex.  Thanks, Aldona Bar

## 2015-05-21 NOTE — Interval H&P Note (Signed)
History and Physical Interval Note:  05/21/2015 10:18 AM  Terry [name]  has presented today for surgery, with the diagnosis of Right arm arteriovenous fistula creation N18.4  The various methods of treatment have been discussed with the patient and family. After consideration of risks, benefits and other options for treatment, the patient has consented to  Procedure(s): ARTERIOVENOUS (AV) FISTULA CREATION (Right) as a surgical intervention .  The patient's history has been reviewed, patient examined, no change in status, stable for surgery.  I have reviewed the patient's chart and labs.  Questions were answered to the patient's satisfaction.     Deitra Mayo

## 2015-05-21 NOTE — H&P (View-Only) (Signed)
   VASCULAR SURGERY ASSESSMENT & PLAN:  * The patient is scheduled for placement of new access in the right arm tomorrow. He is scheduled for dialysis tomorrow and I have asked that he be dialyzed in the afternoon so that it does not interfere with his surgery in the morning. Based on my office note, he appeared to be a good candidate for a right brachiocephalic AV fistula. Second choice would be a basilic vein transposition. If neither were adequate and he would require placement of an AV graft. He does have a functioning catheter.  SUBJECTIVE:   PHYSICAL EXAM:   LABS: Lab Results  Component Value Date   WBC 18.5* 05/20/2015   HGB 9.5* 05/20/2015   HCT 27.3* 05/20/2015   MCV 88.1 05/20/2015   PLT 210 05/20/2015   Lab Results  Component Value Date   CREATININE 5.08* 05/20/2015   Lab Results  Component Value Date   INR 1.28 05/20/2015   CBG (last 3)   Recent Labs  05/19/15 1656 05/19/15 2203 05/20/15 0805  GLUCAP 229* 145* 107*    [address] Beeper: [id number] 05/20/2015

## 2015-05-21 NOTE — Progress Notes (Signed)
Pt tolerated tx well.  Post tx assessment unchanged.  Report called to bedside RN.  Goal of 2500 with net of 2L.  RIJ flushed and heparin locked.

## 2015-05-21 NOTE — Anesthesia Preprocedure Evaluation (Addendum)
Anesthesia Evaluation  Patient identified by MRN, date of birth, ID band Patient awake    Reviewed: Allergy & Precautions, NPO status , Patient's Chart, lab work & pertinent test results  Airway Mallampati: II  TM Distance: >3 FB Neck ROM: Full    Dental  (+) Dental Advisory Given   Pulmonary COPD,  COPD inhaler, former smoker,    breath sounds clear to auscultation       Cardiovascular hypertension, Pt. on medications and Pt. on home beta blockers + CAD, + Cardiac Stents and +CHF  + dysrhythmias Atrial Fibrillation  Rhythm:Irregular Rate:Normal     Neuro/Psych CVA    GI/Hepatic negative GI ROS,   Endo/Other  diabetes (newly diagosed, currently on SSI, A1C 6.6), Type 2  Renal/GU Dialysis, ESRF and CRFRenal disease     Musculoskeletal  (+) Arthritis , Osteoarthritis,    Abdominal   Peds  Hematology  (+) anemia ,   Anesthesia Other Findings   Reproductive/Obstetrics                          Lab Results  Component Value Date   WBC 22.8* 05/21/2015   HGB 9.8* 05/21/2015   HCT 28.8* 05/21/2015   MCV 88.3 05/21/2015   PLT 223 05/21/2015   Lab Results  Component Value Date   CREATININE 5.08* 05/20/2015   BUN 89* 05/20/2015   NA 131* 05/20/2015   K 3.1* 05/20/2015   CL 93* 05/20/2015   CO2 26 05/20/2015    Anesthesia Physical Anesthesia Plan  ASA: IV  Anesthesia Plan: MAC   Post-op Pain Management:    Induction: Intravenous  Airway Management Planned: Simple Face Mask and Natural Airway  Additional Equipment:   Intra-op Plan:   Post-operative Plan:   Informed Consent: I have reviewed the patients History and Physical, chart, labs and discussed the procedure including the risks, benefits and alternatives for the proposed anesthesia with the patient or authorized representative who has indicated his/her understanding and acceptance.     Plan Discussed with:  CRNA  Anesthesia Plan Comments:         Anesthesia Quick Evaluation

## 2015-05-21 NOTE — Anesthesia Procedure Notes (Signed)
Procedure Name: MAC Date/Time: 05/21/2015 10:38 AM Performed by: Salli Quarry Shayde Gervacio Pre-anesthesia Checklist: Patient identified, Emergency Drugs available, Suction available and Patient being monitored Patient Re-evaluated:Patient Re-evaluated prior to inductionOxygen Delivery Method: Nasal cannula

## 2015-05-21 NOTE — Progress Notes (Signed)
Pt arrived to unit per bed at [place].  Report received from bedside RN.  Consent verified.  A & O X 4 Lungs coarse to auscultation Generalized edema Regular rate, irregular rhythm.  RIJ accessed, ports aspirated and flushed.  Tx initiated at 0029 with goal of 3500 and net removal of 3L.  Will continue to monitor.

## 2015-05-22 ENCOUNTER — Encounter (HOSPITAL_COMMUNITY): Payer: Self-pay | Admitting: Vascular Surgery

## 2015-05-22 DIAGNOSIS — J9600 Acute respiratory failure, unspecified whether with hypoxia or hypercapnia: Secondary | ICD-10-CM

## 2015-05-22 DIAGNOSIS — I482 Chronic atrial fibrillation, unspecified: Secondary | ICD-10-CM | POA: Insufficient documentation

## 2015-05-22 DIAGNOSIS — I5033 Acute on chronic diastolic (congestive) heart failure: Secondary | ICD-10-CM

## 2015-05-22 LAB — CBC
HCT: 26.7 % — ABNORMAL LOW (ref 39.0–52.0)
HCT: 26.2 % — ABNORMAL LOW (ref 39.0–52.0)
HEMOGLOBIN: 8.7 g/dL — AB (ref 13.0–17.0)
Hemoglobin: 8.6 g/dL — ABNORMAL LOW (ref 13.0–17.0)
MCH: 29.6 pg (ref 26.0–34.0)
MCH: 29.8 pg (ref 26.0–34.0)
MCHC: 32.6 g/dL (ref 30.0–36.0)
MCHC: 32.8 g/dL (ref 30.0–36.0)
MCV: 90.8 fL (ref 78.0–100.0)
MCV: 90.7 fL (ref 78.0–100.0)
PLATELETS: 202 10*3/uL (ref 150–400)
PLATELETS: 199 10*3/uL (ref 150–400)
RBC: 2.94 MIL/uL — AB (ref 4.22–5.81)
RBC: 2.89 MIL/uL — ABNORMAL LOW (ref 4.22–5.81)
RDW: 14.4 % (ref 11.5–15.5)
RDW: 14.4 % (ref 11.5–15.5)
WBC: 19 10*3/uL — ABNORMAL HIGH (ref 4.0–10.5)
WBC: 19.9 10*3/uL — ABNORMAL HIGH (ref 4.0–10.5)

## 2015-05-22 LAB — RENAL FUNCTION PANEL
ALBUMIN: 3 g/dL — AB (ref 3.5–5.0)
ANION GAP: 12 (ref 5–15)
BUN: 81 mg/dL — ABNORMAL HIGH (ref 6–20)
CALCIUM: 8.3 mg/dL — AB (ref 8.9–10.3)
CO2: 25 mmol/L (ref 22–32)
CREATININE: 5.73 mg/dL — AB (ref 0.61–1.24)
Chloride: 95 mmol/L — ABNORMAL LOW (ref 101–111)
GFR calc Af Amer: 10 mL/min — ABNORMAL LOW (ref >60)
GFR calc non Af Amer: 8 mL/min — ABNORMAL LOW (ref >60)
GLUCOSE: 137 mg/dL — AB (ref 65–99)
PHOSPHORUS: 6.9 mg/dL — AB (ref 2.5–4.6)
Potassium: 4.3 mmol/L (ref 3.5–5.1)
SODIUM: 132 mmol/L — AB (ref 135–145)

## 2015-05-22 LAB — GLUCOSE, CAPILLARY
GLUCOSE-CAPILLARY: 134 mg/dL — AB (ref 65–99)
Glucose-Capillary: 120 mg/dL — ABNORMAL HIGH (ref 65–99)
Glucose-Capillary: 156 mg/dL — ABNORMAL HIGH (ref 65–99)
Glucose-Capillary: 184 mg/dL — ABNORMAL HIGH (ref 65–99)

## 2015-05-22 LAB — PROTIME-INR
INR: 1.18 (ref 0.00–1.49)
PROTHROMBIN TIME: 15.2 s (ref 11.6–15.2)

## 2015-05-22 MED ORDER — HYDROCODONE-ACETAMINOPHEN 5-325 MG PO TABS
1.0000 | ORAL_TABLET | ORAL | Status: DC | PRN
  Administered 2015-05-23 – 2015-05-29 (×2): 1 via ORAL
  Filled 2015-05-22 (×2): qty 1

## 2015-05-22 MED ORDER — LIDOCAINE HCL (PF) 1 % IJ SOLN: 5.0000 mL | INTRAMUSCULAR | Status: DC | PRN

## 2015-05-22 MED ORDER — HEPARIN SODIUM (PORCINE) 1000 UNIT/ML DIALYSIS
20.0000 [IU]/kg | INTRAMUSCULAR | Status: DC | PRN
  Administered 2015-05-22: 1800 [IU] via INTRAVENOUS_CENTRAL
  Filled 2015-05-22: qty 2

## 2015-05-22 MED ORDER — OXYCODONE HCL 5 MG PO TABS
ORAL_TABLET | ORAL | Status: AC
  Administered 2015-05-22: 5 mg via ORAL
  Filled 2015-05-22: qty 1

## 2015-05-22 MED ORDER — HEPARIN SODIUM (PORCINE) 1000 UNIT/ML DIALYSIS: 1000.0000 [IU] | INTRAMUSCULAR | Status: DC | PRN

## 2015-05-22 MED ORDER — SODIUM CHLORIDE 0.9 % IV SOLN: 100.0000 mL | INTRAVENOUS | Status: DC | PRN

## 2015-05-22 MED ORDER — ALTEPLASE 2 MG IJ SOLR: 2.0000 mg | Freq: Once | INTRAMUSCULAR | Status: DC | PRN

## 2015-05-22 MED ORDER — LIDOCAINE-PRILOCAINE 2.5-2.5 % EX CREA
1.0000 "application " | TOPICAL_CREAM | CUTANEOUS | Status: DC | PRN
  Filled 2015-05-22: qty 5

## 2015-05-22 MED ORDER — PENTAFLUOROPROP-TETRAFLUOROETH EX AERO: 1.0000 "application " | INHALATION_SPRAY | CUTANEOUS | Status: DC | PRN

## 2015-05-22 MED ORDER — DARBEPOETIN ALFA 200 MCG/0.4ML IJ SOSY
200.0000 ug | PREFILLED_SYRINGE | INTRAMUSCULAR | Status: DC
  Filled 2015-05-22: qty 0.4

## 2015-05-22 MED ORDER — HYDROCODONE-ACETAMINOPHEN 5-325 MG PO TABS: 2.0000 | ORAL_TABLET | Freq: Once | ORAL | Status: AC

## 2015-05-22 NOTE — Progress Notes (Signed)
PROGRESS NOTE  Terry [name] [id number] DOB: 02/11/1935 DOA: 05/13/2015 PCP: [name], DO  Brief history 80 year old male with a history of COPD, hypertension, CAD/CABG, CKD stage IV, atrial fibrillation diagnosed October 2015 on apixaban presented with one-week history of worsening cough and shortness of breath. The patient was seen by his primary care provider 4 days prior to admission and prescribed Augmentin and prednisone without improvement. He presented to the emergency department where he was noted to have oxygen saturation of 80% on room air and found to be in respiratory distress. The patient was started on doxycycline and Solu-Medrol. The patient has also been describing symptoms of orthopnea and PND with increasing abdominal girth and lower extremity edema for the past week prior to admission. After admission, the patient was started on intravenous steroids. His breathing did not improve. He subsequently started on intravenous furosemide for volume excess. Ultimately, the patient was declared ESRD and dialysis catheter was placed and dialysis initiated on 05/17/2015.  Assessment/Plan: Acute respiratory failure with hypoxia -Secondary to CHF and COPD -Presently stable on 3 L nasal cannula -Wean oxygen for saturation greater than 90-92% -continue Pulmonary hygiene and treatment for COPD/HD for volume control   Acute diastolic CHF  -remains clinically volume overloaded  -furosemide d/c now that pt is on HD -05/16/2015 Echocardiogram--EF 55-60%, trivial MR/TR -Daily weights and strict intake and output  -Volume now manage with HD -advise to follow low sodium diet   Acute on chronic renal failure (CKD 4-5)-->now new on ESRD -Previous Baseline creatinine 3.2-3.4 -Appreciate nephrology-->S/P fistula creation [id number]; R brachiocephalic AVF  -[id number] placed by radiology -05/22/15--fifth HD--klonopin 1 mg prior to HD for anxiety and cramping; next  treatment 11-31  COPD exacerbation  -Patient has >60 pk years -overall improved and breathing a lot easier  -has completed steroids tapering  -continue Pulmonary hygiene and Continue bronchodilators --pt intermittenly refusing -Wean oxygen for saturation greater than 90-92% --will assess for home needs  Atrial fibrillation  -CHADS-VASc = 6 -Discontinue apixaban due to worsening renal function/new ESRD -continue lovenox and coumadin bridge -Continue metoprolol tartrate for rate control  -pharmacy helping with dose  Hypertension -Continue metoprolol tartrate at adjusted dose -advise to follow low sodium diet -HD helping with volume and BP control  Diabetes mellitus type 2 -Checked hemoglobin A1c--6.6 -pt agreeable to start po meds upon d/c (low dose glipizide or glyburide) -NovoLog sliding scale while inpatient -advise to follow low carb diet   Coronary artery disease -Cardiac cath 11/30/1997. Mild distal Left main stenosis, 25% proximal LAD, minor irregularities Circumflex, 95% mid RCA stenosis treated with 3.5 x 32 mm bare metal stent -no angina presently -will monitor  Hyponatremia -Secondary to worsening renal function and fluid overload -improving with HD -will follow renal service rec's -plan is for HD again on 12/1  Leukocytosis -due to steroids -afebrile and hemodynamically stable -no fever -will monitor trend  Family Communication:no family at bedside  Disposition Plan: CLIP needed prior to d/c home; continue HD as recommended by renal service   Procedures/Studies: Dg Chest 2 View  05/13/2015  CLINICAL DATA:  80 year old with 1 week history of productive cough and severe shortness of breath. Former smoker with current history of COPD. EXAM: CHEST  2 VIEW COMPARISON:  05/28/2014 and earlier. FINDINGS: AP erect and lateral imaging was performed. Cardiac silhouette mildly enlarged with a right coronary artery stent. Thoracic aorta atherosclerotic,  unchanged. Prominent central pulmonary arteries, unchanged. Scarring in the  lower lobes, right greater than left, unchanged. Hyperinflation, emphysematous changes in the upper lobes, and mild central peribronchial thickening, unchanged. No new pulmonary parenchymal abnormalities. Degenerative disc disease and spondylosis involving the thoracic spine. IMPRESSION: COPD/emphysema. Scarring in the lower lobes, right greater than left. No acute cardiopulmonary disease. Stable examination. Electronically Signed   By: Evangeline Dakin M.D.   On: 05/13/2015 21:03   US Renal  05/15/2015  CLINICAL DATA:  Acute renal injury EXAM: RENAL / URINARY TRACT ULTRASOUND COMPLETE COMPARISON:  10/24/2014 FINDINGS: Right Kidney: Length: 14.8 cm. Multiple cysts are noted. The largest of these measures 2.2 cm in greatest dimension. Left Kidney: Length: 14.0 cm. Multiple cysts are noted. The largest of these measures 3.1 cm in greatest dimension. Bladder: Appears normal for degree of bladder distention. IMPRESSION: Bilateral renal cysts similar to that seen on prior MRI examination. No acute obstructive changes are noted. Electronically Signed   By: Inez Catalina M.D.   On: 05/15/2015 16:29   Ir Fluoro Guide Cv Line Right  05/17/2015  INDICATION: Chronic kidney disease with early uremia. Need to start hemodialysis. EXAM: FLUOROSCOPIC AND ULTRASOUND GUIDED PLACEMENT OF A TUNNELED DIALYSIS CATHETER Physician: Stephan Minister. Henn, MD FLUOROSCOPY TIME:  42 seconds, 13.2 mGy MEDICATIONS: 2 g Ancef. As antibiotic prophylaxis, Ancef was ordered pre-procedure and administered intravenously within one hour of incision. 0.5 mg versed, 25 mcg fentanyl. A radiology nurse monitored the patient for moderate sedation. ANESTHESIA/SEDATION: Moderate sedation time: 30 minutes PROCEDURE: Informed consent was obtained for placement of a tunneled dialysis catheter. The patient was placed supine on the interventional table. Ultrasound confirmed a patent right  internal jugularvein. Ultrasound images were obtained for documentation. The right side of the neck was prepped and draped in a sterile fashion. The right side of the neck was anesthetized with 1% lidocaine. Maximal barrier sterile technique was utilized including caps, mask, sterile gowns, sterile gloves, sterile drape, hand hygiene and skin antiseptic. A small incision was made with #11 blade scalpel. A 21 gauge needle directed into the right internal jugular vein with ultrasound guidance. A micropuncture dilator set was placed. A 23 cm tip to cuff Palindrome catheter was selected. The skin below the right clavicle was anesthetized and a small incision was made with an #11 blade scalpel. A subcutaneous tunnel was formed to the vein dermatotomy site. The catheter was brought through the tunnel. The vein dermatotomy site was dilated to accommodate a peel-away sheath. The catheter was placed through the peel-away sheath and directed into the central venous structures. The tip of the catheter was placed at the superior cavoatrial junction with fluoroscopy. Fluoroscopic images were obtained for documentation. Both lumens were found to aspirate and flush well. The proper amount of heparin was flushed in both lumens. The vein dermatotomy site was closed using a single layer of absorbable suture and Dermabond. The catheter was secured to the skin using Prolene suture. FINDINGS: Catheter tip at the superior cavoatrial junction. Estimated blood loss: Minimal COMPLICATIONS: None IMPRESSION: Successful placement of a right jugular tunneled dialysis catheter using ultrasound and fluoroscopic guidance. Electronically Signed   By: Markus Daft M.D.   On: 05/17/2015 16:23   Ir US Guide Vasc Access Right  05/17/2015  INDICATION: Chronic kidney disease with early uremia. Need to start hemodialysis. EXAM: FLUOROSCOPIC AND ULTRASOUND GUIDED PLACEMENT OF A TUNNELED DIALYSIS CATHETER Physician: Stephan Minister. Henn, MD FLUOROSCOPY TIME:  42  seconds, 13.2 mGy MEDICATIONS: 2 g Ancef. As antibiotic prophylaxis, Ancef was ordered pre-procedure and administered intravenously  within one hour of incision. 0.5 mg versed, 25 mcg fentanyl. A radiology nurse monitored the patient for moderate sedation. ANESTHESIA/SEDATION: Moderate sedation time: 30 minutes PROCEDURE: Informed consent was obtained for placement of a tunneled dialysis catheter. The patient was placed supine on the interventional table. Ultrasound confirmed a patent right internal jugularvein. Ultrasound images were obtained for documentation. The right side of the neck was prepped and draped in a sterile fashion. The right side of the neck was anesthetized with 1% lidocaine. Maximal barrier sterile technique was utilized including caps, mask, sterile gowns, sterile gloves, sterile drape, hand hygiene and skin antiseptic. A small incision was made with #11 blade scalpel. A 21 gauge needle directed into the right internal jugular vein with ultrasound guidance. A micropuncture dilator set was placed. A 23 cm tip to cuff Palindrome catheter was selected. The skin below the right clavicle was anesthetized and a small incision was made with an #11 blade scalpel. A subcutaneous tunnel was formed to the vein dermatotomy site. The catheter was brought through the tunnel. The vein dermatotomy site was dilated to accommodate a peel-away sheath. The catheter was placed through the peel-away sheath and directed into the central venous structures. The tip of the catheter was placed at the superior cavoatrial junction with fluoroscopy. Fluoroscopic images were obtained for documentation. Both lumens were found to aspirate and flush well. The proper amount of heparin was flushed in both lumens. The vein dermatotomy site was closed using a single layer of absorbable suture and Dermabond. The catheter was secured to the skin using Prolene suture. FINDINGS: Catheter tip at the superior cavoatrial junction. Estimated  blood loss: Minimal COMPLICATIONS: None IMPRESSION: Successful placement of a right jugular tunneled dialysis catheter using ultrasound and fluoroscopic guidance. Electronically Signed   By: Markus Daft M.D.   On: 05/17/2015 16:23     Subjective: Denies any fevers, chills, chest pain, nausea, vomiting, diarrhea, abdominal pain, rashes or headaches. Patient still with signs of fluid overload. Tolerating HD and waiting CLI{{Ing process to be completed.  Objective: Filed Vitals:   05/22/15 0859 05/22/15 0923 05/22/15 0956 05/22/15 0959  BP: 109/60 126/53 90/41 114/56  Pulse: 106 116 113 101  Temp:      TempSrc:      Resp:      Height:      Weight:      SpO2:        Intake/Output Summary (Last 24 hours) at 05/22/15 1025 Last data filed at 05/22/15 0553  Gross per 24 hour  Intake 888.84 ml  Output      0 ml  Net 888.84 ml   Weight change: -2.539 kg (-5 lb 9.6 oz) Exam:   General:  Pt is alert, follows commands appropriately, not in acute distress. Tolerated HD w/o problems  HEENT: No icterus, No thrush, No neck mass, No ears or nostrils discharge  Cardiovascular: RRR, S1/S2, no rubs, no gallops; mild JVD on exam  Respiratory: Scattered bilateral rales and rhonchi; no wheezing. Fair air movement   Abdomen: Soft/+BS, non tender, non distended, no guarding; no hepatosplenomegaly  Extremities: 2+ LE edema, No lymphangitis, No petechiae, No rashes, no synovitis  Data Reviewed: Basic Metabolic Panel:  Recent Labs Lab 05/16/15 0248 05/17/15 0414 05/17/15 2156 05/18/15 1835 05/19/15 0442 05/20/15 0718 05/21/15 0940 05/22/15 0700  NA 128* 127* 124* 127* 133* 131* 131* 132*  K 3.9 3.4* 3.2* 3.3* 3.4* 3.1* 3.7 4.3  CL 95* 93* 89* 91* 94* 93*  --  95*  CO2 20* 20* 21* 24 28 26   --  25  GLUCOSE 185* 160* 155* 203* 130* 103* 121* 137*  BUN 109* 121* 127* 92* 64* 89*  --  81*  CREATININE 5.51* 5.88* 5.99* 4.67* 3.88* 5.08*  --  5.73*  CALCIUM 7.8* 7.6* 7.3* 7.5* 8.1* 8.3*   --  8.3*  MG 1.9  --   --   --   --   --   --   --   PHOS 7.1* 8.2* 8.2* 6.2*  --  7.1*  --  6.9*   Liver Function Tests:  Recent Labs Lab 05/16/15 0248 05/17/15 0414 05/17/15 2156 05/18/15 1835 05/20/15 0718 05/22/15 0700  AST 18  --   --   --   --   --   ALT 22  --   --   --   --   --   ALKPHOS 38  --   --   --   --   --   BILITOT 0.5  --   --   --   --   --   PROT 5.7*  --   --   --   --   --   ALBUMIN 3.0* 3.1* 3.0* 3.0* 3.1* 3.0*   CBC:  Recent Labs Lab 05/19/15 0442 05/20/15 0718 05/21/15 0534 05/21/15 0940 05/22/15 0425 05/22/15 0700  WBC 13.8* 18.5* 22.8*  --  19.0* 19.9*  HGB 9.2* 9.5* 9.8* 9.9* 8.7* 8.6*  HCT 26.4* 27.3* 28.8* 29.0* 26.7* 26.2*  MCV 87.1 88.1 88.3  --  90.8 90.7  PLT 219 210 223  --  202 199   CBG:  Recent Labs Lab 05/20/15 2134 05/21/15 0812 05/21/15 1250 05/21/15 1648 05/21/15 2145  GLUCAP 154* 107* 116* 162* 145*    Recent Results (from the past 240 hour(s))  MRSA PCR Screening     Status: Abnormal   Collection Time: 05/14/15  7:18 AM  Result Value Ref Range Status   MRSA by PCR POSITIVE (A) NEGATIVE Final    Comment:        The GeneXpert MRSA Assay (FDA approved for NASAL specimens only), is one component of a comprehensive MRSA colonization surveillance program. It is not intended to diagnose MRSA infection nor to guide or monitor treatment for MRSA infections. RESULT CALLED TO, READ BACK BY AND VERIFIED WITH: H. MILLS RN 9:55 05/14/15 ([email or web address])   Surgical pcr screen     Status: Abnormal   Collection Time: 05/21/15  5:09 AM  Result Value Ref Range Status   MRSA, PCR POSITIVE (A) NEGATIVE Final   Staphylococcus aureus POSITIVE (A) NEGATIVE Final    Comment:        The Xpert SA Assay (FDA approved for NASAL specimens in patients over 25 years of age), is one component of a comprehensive surveillance program.  Test performance has been validated by [facility] for patients greater than or equal to 56 year  old. It is not intended to diagnose infection nor to guide or monitor treatment.      Scheduled Meds: . atorvastatin  20 mg Oral q1800  . budesonide-formoterol  2 puff Inhalation BID  . calcitRIOL  0.25 mcg Oral Daily  . darbepoetin (ARANESP) injection - NON-DIALYSIS  200 mcg Subcutaneous Q Thu-1800  . enoxaparin (LOVENOX) injection  95 mg Subcutaneous Q24H  . insulin aspart  0-15 Units Subcutaneous TID WC  . insulin aspart  0-5 Units Subcutaneous QHS  . ipratropium-albuterol  3 mL Nebulization TID  .  metoprolol  25 mg Oral BID  . sodium chloride  500 mL Intravenous Once  . sodium chloride  3 mL Intravenous Q12H  . tiotropium  18 mcg Inhalation Daily   Continuous Infusions: . sodium chloride 10 mL/hr at 05/21/15 0946     Terry Dubois, MD  [facility] Pager [phone]  If 7PM-7AM, please contact night-coverage [email or web address] Password [email or web address] 05/22/2015, 10:25 AM   LOS: 8 days

## 2015-05-22 NOTE — Progress Notes (Signed)
   VASCULAR SURGERY ASSESSMENT & PLAN:  * 1 Day Post-Op s/p: Right brachiocephalic AV fistula.  *  This fistula is working well. Vascular surgery will be available as needed.  SUBJECTIVE: No complaints except  Still some shortness of breath with exertion.  PHYSICAL EXAM: Filed Vitals:   05/21/15 1227 05/21/15 2053 05/21/15 2130 05/22/15 0422  BP: 102/45 122/49  114/57  Pulse: 81 94  83  Temp: 98.4 F (36.9 C) 98.6 F (37 C)  98.6 F (37 C)  TempSrc: Oral Oral  Oral  Resp:  18  16  Height:      Weight:    204 lb 8 oz (92.761 kg)  SpO2: 89% 96% 95% 96%   Right brachial cephalic fistula has an excellent thrill. Palpable right radial pulse. Incision looks fine.  LABS: Lab Results  Component Value Date   WBC 19.0* 05/22/2015   HGB 8.7* 05/22/2015   HCT 26.7* 05/22/2015   MCV 90.8 05/22/2015   PLT 202 05/22/2015   Lab Results  Component Value Date   CREATININE 5.08* 05/20/2015   Lab Results  Component Value Date   INR 1.18 05/22/2015   CBG (last 3)   Recent Labs  05/21/15 1250 05/21/15 1648 05/21/15 2145  GLUCAP 116* 162* 145*    Principal Problem:   COPD exacerbation (HCC) Active Problems:   CKD (chronic kidney disease) stage 4, GFR 15-29 ml/min (HCC)   CAD (coronary artery disease) of artery bypass graft   Essential hypertension   Acute on chronic kidney failure (HCC)   Acute respiratory failure with hypoxia (HCC)   Acute renal failure superimposed on stage 4 chronic kidney disease (HCC)   Acute diastolic CHF (congestive heart failure) ([place])   ESRD (end stage renal disease) ([place])    Terry [name] [id number] 05/22/2015

## 2015-05-22 NOTE — Progress Notes (Signed)
05/22/2015 8:51 AM Hemodialysis Note; the CLIP process has been initiated for [facility]. Gordy Savers

## 2015-05-22 NOTE — Procedures (Signed)
Tolerating treatment without hemodynamic instability.  Anxiety during treatment.  Has a lot of edema. HD again in AM.  Assessment/Plan: 1 New start ESRD, dialysis dependent. S/p AVF RBC today Dr. Scot Dock 2 Anemia on esa/fe 3 HPTH on vit D 4 COPD  5 CAD 6 DJD 7 CHF/Vol overload  P HD again for volume Thursday. Terry [name] C

## 2015-05-22 NOTE — Progress Notes (Signed)
ANTICOAGULATION CONSULT NOTE - Follow Up Consult  Pharmacy Consult for Lovenox Indication: atrial fibrillation  Allergies  Allergen Reactions  . Yellow Dyes (Non-Tartrazine) Other (See Comments)    Burns arms  . Levaquin [Levofloxacin In D5w] Itching    Patient Measurements: Height: 5\' 8"  (172.7 cm) Weight: 204 lb 8 oz (92.761 kg) IBW/kg (Calculated) : 68.4  Vital Signs: Temp: 98.5 F (36.9 C) (11/30 1045) Temp Source: Oral (11/30 0422) BP: 109/60 mmHg (11/30 1045) Pulse Rate: 104 (11/30 1045)  Labs:  Recent Labs  05/20/15 0718 05/21/15 0534 05/21/15 0940 05/22/15 0425 05/22/15 0700  HGB 9.5* 9.8* 9.9* 8.7* 8.6*  HCT 27.3* 28.8* 29.0* 26.7* 26.2*  PLT 210 223  --  202 199  LABPROT 16.1* 14.0  --  15.2  --   INR 1.28 1.06  --  1.18  --   CREATININE 5.08*  --   --   --  5.73*    Estimated Creatinine Clearance: 11.8 mL/min (by C-G formula based on Cr of 5.73).  Assessment: 80 y/o male with CKD V who was on apixiban PTA for Afib who has now transitioned to ESRD and is HD dependent. Plan is to start warfarin in leu of apixaban. Pharmacy consulted to bridge with Lovenox in the mean time. He had right AVF created on 11/29. No bleeding noted, CBC is stable.  Goal of Therapy:  Anti-Xa level 0.6-1 units/ml 4hrs after LMWH dose given Monitor platelets by anticoagulation protocol: Yes   Plan:  - Lovenox 95 mg SQ q24h - due this morning - CBC q72h while on Lovenox - Monitor for s/sx of bleeding - Follow-up plans to start warfarin  [facility], [place].D., BCPS Clinical Pharmacist Pager: [phone] 05/22/2015 11:14 AM

## 2015-05-23 LAB — GLUCOSE, CAPILLARY
GLUCOSE-CAPILLARY: 97 mg/dL (ref 65–99)
Glucose-Capillary: 132 mg/dL — ABNORMAL HIGH (ref 65–99)
Glucose-Capillary: 99 mg/dL (ref 65–99)
Glucose-Capillary: 171 mg/dL — ABNORMAL HIGH (ref 65–99)

## 2015-05-23 LAB — PROTIME-INR
INR: 1.17 (ref 0.00–1.49)
PROTHROMBIN TIME: 15 s (ref 11.6–15.2)

## 2015-05-23 MED ORDER — LIDOCAINE HCL (PF) 1 % IJ SOLN: 5.0000 mL | INTRAMUSCULAR | Status: DC | PRN

## 2015-05-23 MED ORDER — WARFARIN - PHARMACIST DOSING INPATIENT
Freq: Every day | Status: DC
  Administered 2015-05-24 – 2015-05-29 (×3)

## 2015-05-23 MED ORDER — COUMADIN BOOK
Freq: Once | Status: AC
  Administered 2015-05-23: 09:00:00
  Filled 2015-05-23: qty 1

## 2015-05-23 MED ORDER — SODIUM CHLORIDE 0.9 % IV SOLN: 100.0000 mL | INTRAVENOUS | Status: DC | PRN

## 2015-05-23 MED ORDER — SODIUM CHLORIDE 0.9 % IV SOLN
100.0000 mL | INTRAVENOUS | Status: DC | PRN
Start: 2015-05-23 — End: 2015-05-24

## 2015-05-23 MED ORDER — HEPARIN SODIUM (PORCINE) 1000 UNIT/ML DIALYSIS: 1000.0000 [IU] | INTRAMUSCULAR | Status: DC | PRN

## 2015-05-23 MED ORDER — CLONAZEPAM 1 MG PO TABS
1.0000 mg | ORAL_TABLET | Freq: Every day | ORAL | Status: DC | PRN
  Administered 2015-05-23 – 2015-06-03 (×7): 1 mg via ORAL
  Filled 2015-05-23 (×7): qty 1

## 2015-05-23 MED ORDER — WARFARIN VIDEO
Freq: Once | Status: AC
  Administered 2015-05-23: 10:00:00

## 2015-05-23 MED ORDER — LIDOCAINE-PRILOCAINE 2.5-2.5 % EX CREA: 1.0000 "application " | TOPICAL_CREAM | CUTANEOUS | Status: DC | PRN

## 2015-05-23 MED ORDER — CYCLOBENZAPRINE HCL 10 MG PO TABS
10.0000 mg | ORAL_TABLET | Freq: Once | ORAL | Status: AC
  Administered 2015-05-24: 10 mg via ORAL
  Filled 2015-05-23: qty 1

## 2015-05-23 MED ORDER — HEPARIN SODIUM (PORCINE) 1000 UNIT/ML DIALYSIS: 20.0000 [IU]/kg | INTRAMUSCULAR | Status: DC | PRN

## 2015-05-23 MED ORDER — WARFARIN SODIUM 7.5 MG PO TABS
7.5000 mg | ORAL_TABLET | Freq: Once | ORAL | Status: AC
  Administered 2015-05-23: 7.5 mg via ORAL
  Filled 2015-05-23: qty 1

## 2015-05-23 MED ORDER — PENTAFLUOROPROP-TETRAFLUOROETH EX AERO: 1.0000 "application " | INHALATION_SPRAY | CUTANEOUS | Status: DC | PRN

## 2015-05-23 MED ORDER — ALTEPLASE 2 MG IJ SOLR
2.0000 mg | Freq: Once | INTRAMUSCULAR | Status: DC | PRN
  Filled 2015-05-23: qty 2

## 2015-05-23 MED ORDER — ENOXAPARIN SODIUM 100 MG/ML ~~LOC~~ SOLN
90.0000 mg | SUBCUTANEOUS | Status: DC
  Administered 2015-05-23 – 2015-05-26 (×4): 90 mg via SUBCUTANEOUS
  Filled 2015-05-23 (×4): qty 1

## 2015-05-23 NOTE — Progress Notes (Signed)
Confirmation received that Terry [name]  Is scheduled to begin outpatient hemodialysis at [facility] on Dec 02 ,2016 @11 :[name]

## 2015-05-23 NOTE — Progress Notes (Signed)
ANTICOAGULATION CONSULT NOTE - Initial Consult  Pharmacy Consult for Lovenox + new Coumadin Indication: atrial fibrillation  Allergies  Allergen Reactions  . Yellow Dyes (Non-Tartrazine) Other (See Comments)    Burns arms  . Levaquin [Levofloxacin In D5w] Itching    Patient Measurements: Height: 5\' 8"  (172.7 cm) Weight: 198 lb 13.7 oz (90.2 kg) IBW/kg (Calculated) : 68.4  Vital Signs: Temp: 98 F (36.7 C) (12/01 0646) Temp Source: Oral (12/01 0646) BP: 117/50 mmHg (12/01 0646) Pulse Rate: 86 (12/01 0646)  Labs:  Recent Labs  05/21/15 0534 05/21/15 0940 05/22/15 0425 05/22/15 0700 05/23/15 0513  HGB 9.8* 9.9* 8.7* 8.6*  --   HCT 28.8* 29.0* 26.7* 26.2*  --   PLT 223  --  202 199  --   LABPROT 14.0  --  15.2  --  15.0  INR 1.06  --  1.18  --  1.17  CREATININE  --   --   --  5.73*  --     Estimated Creatinine Clearance: 11.6 mL/min (by C-G formula based on Cr of 5.73).   Medical History: Past Medical History  Diagnosis Date  . Stroke ([place]) 2012  . High cholesterol   . Hypertension   . Kidney disease     CKD4, sees [place] [facility], considering dialysis  . COPD (chronic obstructive pulmonary disease) ([place])   . CAD (coronary artery disease) [place], [name]  . Acute and chronic respiratory failure 10/22/2012  . Osteoarthritis 10/22/2012  . Pneumonia April 2014  . Atrial fibrillation [facility])     Assessment: 36 yom admitted 05/13/2015 for cough, dyspnea  PMH: afib, OA, CAD, COPD, CKD, HTN, HLD, CVA  AC/heme: Lovenox for hx afib (PTA apixaban - changed d/t renal dysfxn, renal MD wanted lovenox) -  INR 1.17 , Hgb 8.6, plts 199, no bleeding noted. LMWH once daily for ESRD. Start Coumadin this PM CHADS-VASc = 6  Goal of Therapy:  INR 2-3 Monitor platelets by anticoagulation protocol: Yes   Plan:  - Decreased lovenox to 95>>90mg  [place] Q24h based on weight - F/U weight changes as may need to adjust lovenox again  (wt 90kg- f/u AM) - Coumadin 7.5mg  po x 1  tonight - Daily INR -Coumadin book/video ordered   Warrick Llera S. Alford Highland, PharmD, [place] Clinical Staff Pharmacist Pager [phone]  Eilene Ghazi Stillinger 05/23/2015,8:27 AM

## 2015-05-23 NOTE — Progress Notes (Signed)
Pt's tele leads keep coming off. Replaced leads twice but they are off again. Spoke with central tele who placed him on standby until I could replace his leads again. Pt states he doesn't want to be "messed with right now." I will try again a little later. Terry [name] D

## 2015-05-23 NOTE — Progress Notes (Signed)
  Assessment/Plan: 1 New start ESRD, dialysis dependent. S/p AVF RBC today Dr. Scot Dock 2 Anemia on esa/fe 3 HPTH on vit D 4 COPD  5 CAD 6 DJD 7 CHF/Vol overload  P HD again for volume Today.  Subjective: Interval History: c/o cramps with HD  Objective: Vital signs in last 24 hours: Temp:  [97.6 F (36.4 C)-99.2 F (37.3 C)] 98 F (36.7 C) (12/01 0646) Pulse Rate:  [84-94] 86 (12/01 0646) Resp:  [18] 18 (12/01 0646) BP: (117-119)/(48-54) 117/50 mmHg (12/01 0646) SpO2:  [93 %-97 %] 93 % (12/01 0834) Weight:  [90.2 kg (198 lb 13.7 oz)] 90.2 kg (198 lb 13.7 oz) (12/01 0646) Weight change: -2.561 kg (-5 lb 10.3 oz)  Intake/Output from previous day: 11/30 0701 - 12/01 0700 In: 0  Out: 4000 [Urine:300] Intake/Output this shift: Total I/O In: 120 [P.O.:120] Out: -   General appearance: alert and cooperative Nose: Nares normal. Septum midline. Mucosa normal. No drainage or sinus tenderness., wearing O2 Cardio: regular rate and rhythm, S1, S2 normal, no murmur, click, rub or gallop Extremities: edema 2-3+   Lab Results:  Recent Labs  05/22/15 0425 05/22/15 0700  WBC 19.0* 19.9*  HGB 8.7* 8.6*  HCT 26.7* 26.2*  PLT 202 199   BMET:  Recent Labs  05/21/15 0940 05/22/15 0700  NA 131* 132*  K 3.7 4.3  CL  --  95*  CO2  --  25  GLUCOSE 121* 137*  BUN  --  81*  CREATININE  --  5.73*  CALCIUM  --  8.3*   No results for input(s): PTH in the last 72 hours. Iron Studies: No results for input(s): IRON, TIBC, TRANSFERRIN, FERRITIN in the last 72 hours. Studies/Results: No results found.  Scheduled: . atorvastatin  20 mg Oral q1800  . budesonide-formoterol  2 puff Inhalation BID  . calcitRIOL  0.25 mcg Oral Daily  . darbepoetin (ARANESP) injection - DIALYSIS  200 mcg Intravenous Q Thu-HD  . enoxaparin (LOVENOX) injection  90 mg Subcutaneous Q24H  . insulin aspart  0-15 Units Subcutaneous TID WC  . insulin aspart  0-5 Units Subcutaneous QHS  .  ipratropium-albuterol  3 mL Nebulization TID  . metoprolol  25 mg Oral BID  . sodium chloride  500 mL Intravenous Once  . sodium chloride  3 mL Intravenous Q12H  . tiotropium  18 mcg Inhalation Daily  . warfarin  7.5 mg Oral ONCE-1800  . Warfarin - Pharmacist Dosing Inpatient   Does not apply q1800    LOS: 9 days   Adrean Heitz C 05/23/2015,1:06 PM

## 2015-05-23 NOTE — Progress Notes (Signed)
PROGRESS NOTE  Terry [name] [id number] DOB: 03/20/1935 DOA: 05/13/2015 PCP: [name], DO  Brief history 80 year old male with a history of COPD, hypertension, CAD/CABG, CKD stage IV, atrial fibrillation diagnosed October 2015 on apixaban presented with one-week history of worsening cough and shortness of breath. The patient was seen by his primary care provider 4 days prior to admission and prescribed Augmentin and prednisone without improvement. He presented to the emergency department where he was noted to have oxygen saturation of 80% on room air and found to be in respiratory distress. The patient was started on doxycycline and Solu-Medrol. The patient has also been describing symptoms of orthopnea and PND with increasing abdominal girth and lower extremity edema for the past week prior to admission. After admission, the patient was started on intravenous steroids. His breathing did not improve. He subsequently started on intravenous furosemide for volume excess. Ultimately, the patient was declared ESRD and dialysis catheter was placed and dialysis initiated on 05/17/2015.  Assessment/Plan: Acute respiratory failure with hypoxia -Secondary to CHF and COPD -Presently stable on 3 L nasal cannula -Wean oxygen for saturation greater than 90-92% -continue Pulmonary hygiene and treatment for COPD/HD for volume control   Acute diastolic CHF  -remains clinically volume overloaded  -furosemide d/c now that pt is on HD -05/16/2015 Echocardiogram--EF 55-60%, trivial MR/TR -Daily weights and strict intake and output  -Volume now manage with HD -advise to follow low sodium diet   Acute on chronic renal failure (CKD 4-5)-->now new on ESRD -Previous Baseline creatinine 3.2-3.4 -Appreciate nephrology-->S/P fistula creation [id number]; R brachiocephalic AVF  -[id number] placed by radiology -05/22/15--fifth HD--klonopin 1 mg prior to HD for anxiety and cramping; next  treatment 11-31  COPD exacerbation  -Patient has >60 pk years -overall improved and breathing a lot easier  -has completed steroids tapering  -continue Pulmonary hygiene and Continue bronchodilators --pt intermittenly refusing -Wean oxygen for saturation greater than 90-92% --will assess for home needs  Atrial fibrillation  -CHADS-VASc = 6 -Discontinue apixaban due to worsening renal function/new ESRD -continue lovenox and coumadin bridge -Continue metoprolol tartrate for rate control  -pharmacy helping with dose  Hypertension -Continue metoprolol tartrate at adjusted dose -advise to follow low sodium diet -HD helping with volume and BP control  Diabetes mellitus type 2 -Checked hemoglobin A1c--6.6 -pt agreeable to start po meds upon d/c (low dose glipizide or glyburide) -NovoLog sliding scale while inpatient -advise to follow low carb diet   Coronary artery disease -Cardiac cath 11/30/1997. Mild distal Left main stenosis, 25% proximal LAD, minor irregularities Circumflex, 95% mid RCA stenosis treated with 3.5 x 32 mm bare metal stent -no angina presently -will monitor  Hyponatremia -Secondary to worsening renal function and fluid overload -improving with HD -will follow renal service rec's -plan is for HD again  today12/1  Leukocytosis -due to steroids -afebrile and hemodynamically stable -no fever -will monitor trend  Family Communication:no family at bedside  Disposition Plan: CLIPPED with [facility] HD outpatient unit. Still significantly fluid overload; per renal service will need further inpatient treatment before discharge; continue HD as recommended by renal service. Continue lovenox and coumadin   Procedures/Studies: Dg Chest 2 View  05/13/2015  CLINICAL DATA:  80 year old with 1 week history of productive cough and severe shortness of breath. Former smoker with current history of COPD. EXAM: CHEST  2 VIEW COMPARISON:  05/28/2014 and  earlier. FINDINGS: AP erect and lateral imaging was performed. Cardiac silhouette  mildly enlarged with a right coronary artery stent. Thoracic aorta atherosclerotic, unchanged. Prominent central pulmonary arteries, unchanged. Scarring in the lower lobes, right greater than left, unchanged. Hyperinflation, emphysematous changes in the upper lobes, and mild central peribronchial thickening, unchanged. No new pulmonary parenchymal abnormalities. Degenerative disc disease and spondylosis involving the thoracic spine. IMPRESSION: COPD/emphysema. Scarring in the lower lobes, right greater than left. No acute cardiopulmonary disease. Stable examination. Electronically Signed   By: Evangeline Dakin M.D.   On: 05/13/2015 21:03   US Renal  05/15/2015  CLINICAL DATA:  Acute renal injury EXAM: RENAL / URINARY TRACT ULTRASOUND COMPLETE COMPARISON:  10/24/2014 FINDINGS: Right Kidney: Length: 14.8 cm. Multiple cysts are noted. The largest of these measures 2.2 cm in greatest dimension. Left Kidney: Length: 14.0 cm. Multiple cysts are noted. The largest of these measures 3.1 cm in greatest dimension. Bladder: Appears normal for degree of bladder distention. IMPRESSION: Bilateral renal cysts similar to that seen on prior MRI examination. No acute obstructive changes are noted. Electronically Signed   By: Inez Catalina M.D.   On: 05/15/2015 16:29   Ir Fluoro Guide Cv Line Right  05/17/2015  INDICATION: Chronic kidney disease with early uremia. Need to start hemodialysis. EXAM: FLUOROSCOPIC AND ULTRASOUND GUIDED PLACEMENT OF A TUNNELED DIALYSIS CATHETER Physician: Stephan Minister. Henn, MD FLUOROSCOPY TIME:  42 seconds, 13.2 mGy MEDICATIONS: 2 g Ancef. As antibiotic prophylaxis, Ancef was ordered pre-procedure and administered intravenously within one hour of incision. 0.5 mg versed, 25 mcg fentanyl. A radiology nurse monitored the patient for moderate sedation. ANESTHESIA/SEDATION: Moderate sedation time: 30 minutes PROCEDURE: Informed  consent was obtained for placement of a tunneled dialysis catheter. The patient was placed supine on the interventional table. Ultrasound confirmed a patent right internal jugularvein. Ultrasound images were obtained for documentation. The right side of the neck was prepped and draped in a sterile fashion. The right side of the neck was anesthetized with 1% lidocaine. Maximal barrier sterile technique was utilized including caps, mask, sterile gowns, sterile gloves, sterile drape, hand hygiene and skin antiseptic. A small incision was made with #11 blade scalpel. A 21 gauge needle directed into the right internal jugular vein with ultrasound guidance. A micropuncture dilator set was placed. A 23 cm tip to cuff Palindrome catheter was selected. The skin below the right clavicle was anesthetized and a small incision was made with an #11 blade scalpel. A subcutaneous tunnel was formed to the vein dermatotomy site. The catheter was brought through the tunnel. The vein dermatotomy site was dilated to accommodate a peel-away sheath. The catheter was placed through the peel-away sheath and directed into the central venous structures. The tip of the catheter was placed at the superior cavoatrial junction with fluoroscopy. Fluoroscopic images were obtained for documentation. Both lumens were found to aspirate and flush well. The proper amount of heparin was flushed in both lumens. The vein dermatotomy site was closed using a single layer of absorbable suture and Dermabond. The catheter was secured to the skin using Prolene suture. FINDINGS: Catheter tip at the superior cavoatrial junction. Estimated blood loss: Minimal COMPLICATIONS: None IMPRESSION: Successful placement of a right jugular tunneled dialysis catheter using ultrasound and fluoroscopic guidance. Electronically Signed   By: Markus Daft M.D.   On: 05/17/2015 16:23   Ir US Guide Vasc Access Right  05/17/2015  INDICATION: Chronic kidney disease with early  uremia. Need to start hemodialysis. EXAM: FLUOROSCOPIC AND ULTRASOUND GUIDED PLACEMENT OF A TUNNELED DIALYSIS CATHETER Physician: Stephan Minister. Anselm Pancoast, MD FLUOROSCOPY  TIME:  42 seconds, 13.2 mGy MEDICATIONS: 2 g Ancef. As antibiotic prophylaxis, Ancef was ordered pre-procedure and administered intravenously within one hour of incision. 0.5 mg versed, 25 mcg fentanyl. A radiology nurse monitored the patient for moderate sedation. ANESTHESIA/SEDATION: Moderate sedation time: 30 minutes PROCEDURE: Informed consent was obtained for placement of a tunneled dialysis catheter. The patient was placed supine on the interventional table. Ultrasound confirmed a patent right internal jugularvein. Ultrasound images were obtained for documentation. The right side of the neck was prepped and draped in a sterile fashion. The right side of the neck was anesthetized with 1% lidocaine. Maximal barrier sterile technique was utilized including caps, mask, sterile gowns, sterile gloves, sterile drape, hand hygiene and skin antiseptic. A small incision was made with #11 blade scalpel. A 21 gauge needle directed into the right internal jugular vein with ultrasound guidance. A micropuncture dilator set was placed. A 23 cm tip to cuff Palindrome catheter was selected. The skin below the right clavicle was anesthetized and a small incision was made with an #11 blade scalpel. A subcutaneous tunnel was formed to the vein dermatotomy site. The catheter was brought through the tunnel. The vein dermatotomy site was dilated to accommodate a peel-away sheath. The catheter was placed through the peel-away sheath and directed into the central venous structures. The tip of the catheter was placed at the superior cavoatrial junction with fluoroscopy. Fluoroscopic images were obtained for documentation. Both lumens were found to aspirate and flush well. The proper amount of heparin was flushed in both lumens. The vein dermatotomy site was closed using a single  layer of absorbable suture and Dermabond. The catheter was secured to the skin using Prolene suture. FINDINGS: Catheter tip at the superior cavoatrial junction. Estimated blood loss: Minimal COMPLICATIONS: None IMPRESSION: Successful placement of a right jugular tunneled dialysis catheter using ultrasound and fluoroscopic guidance. Electronically Signed   By: Markus Daft M.D.   On: 05/17/2015 16:23     Subjective: No CP, no fever. Patient still with signs of fluid overload. Tolerating HD well. Has been accepted for outpatient facility at [facility] outpatient HD unit.   Objective: Filed Vitals:   05/22/15 2311 05/23/15 0646 05/23/15 0834 05/23/15 1339  BP: 117/48 117/50  97/62  Pulse: 94 86  73  Temp: 97.6 F (36.4 C) 98 F (36.7 C)  98.6 F (37 C)  TempSrc: Oral Oral    Resp: 18 18  19   Height:      Weight:  90.2 kg (198 lb 13.7 oz)    SpO2: 94% 96% 93% 97%    Intake/Output Summary (Last 24 hours) at 05/23/15 1618 Last data filed at 05/23/15 1413  Gross per 24 hour  Intake    320 ml  Output    300 ml  Net     20 ml   Weight change: -2.561 kg (-5 lb 10.3 oz) Exam:   General:  Pt is alert, follows commands appropriately, not in acute distress. Tolerating HD w/o problems. Still fluid overload on exam  HEENT: No icterus, No thrush, No neck mass, No ears or nostrils discharge  Cardiovascular: RRR, S1/S2, no rubs, no gallops; mild JVD on exam  Respiratory: Scattered bilateral rales and rhonchi; no wheezing. Fair air movement   Abdomen: Soft/+BS, non tender, non distended, no guarding; no hepatosplenomegaly  Extremities: 2+ LE edema, No lymphangitis, No petechiae, No rashes, no synovitis  Data Reviewed: Basic Metabolic Panel:  Recent Labs Lab 05/17/15 0414 05/17/15 2156 05/18/15 1835 05/19/15  [id number] 05/20/15 0718 05/21/15 0940 05/22/15 0700  NA 127* 124* 127* 133* 131* 131* 132*  K 3.4* 3.2* 3.3* 3.4* 3.1* 3.7 4.3  CL 93* 89* 91* 94* 93*  --  95*  CO2 20* 21* 24 28  26   --  25  GLUCOSE 160* 155* 203* 130* 103* 121* 137*  BUN 121* 127* 92* 64* 89*  --  81*  CREATININE 5.88* 5.99* 4.67* 3.88* 5.08*  --  5.73*  CALCIUM 7.6* 7.3* 7.5* 8.1* 8.3*  --  8.3*  PHOS 8.2* 8.2* 6.2*  --  7.1*  --  6.9*   Liver Function Tests:  Recent Labs Lab 05/17/15 0414 05/17/15 2156 05/18/15 1835 05/20/15 0718 05/22/15 0700  ALBUMIN 3.1* 3.0* 3.0* 3.1* 3.0*   CBC:  Recent Labs Lab 05/19/15 0442 05/20/15 0718 05/21/15 0534 05/21/15 0940 05/22/15 0425 05/22/15 0700  WBC 13.8* 18.5* 22.8*  --  19.0* 19.9*  HGB 9.2* 9.5* 9.8* 9.9* 8.7* 8.6*  HCT 26.4* 27.3* 28.8* 29.0* 26.7* 26.2*  MCV 87.1 88.1 88.3  --  90.8 90.7  PLT 219 210 223  --  202 199   CBG:  Recent Labs Lab 05/22/15 1116 05/22/15 1657 05/22/15 2142 05/23/15 0823 05/23/15 1144  GLUCAP 134* 156* 184* 97 132*    Recent Results (from the past 240 hour(s))  MRSA PCR Screening     Status: Abnormal   Collection Time: 05/14/15  7:18 AM  Result Value Ref Range Status   MRSA by PCR POSITIVE (A) NEGATIVE Final    Comment:        The GeneXpert MRSA Assay (FDA approved for NASAL specimens only), is one component of a comprehensive MRSA colonization surveillance program. It is not intended to diagnose MRSA infection nor to guide or monitor treatment for MRSA infections. RESULT CALLED TO, READ BACK BY AND VERIFIED WITH: H. MILLS RN 9:55 05/14/15 ([email or web address])   Surgical pcr screen     Status: Abnormal   Collection Time: 05/21/15  5:09 AM  Result Value Ref Range Status   MRSA, PCR POSITIVE (A) NEGATIVE Final   Staphylococcus aureus POSITIVE (A) NEGATIVE Final    Comment:        The Xpert SA Assay (FDA approved for NASAL specimens in patients over 49 years of age), is one component of a comprehensive surveillance program.  Test performance has been validated by [facility] for patients greater than or equal to 36 year old. It is not intended to diagnose infection nor to guide or monitor  treatment.      Scheduled Meds: . atorvastatin  20 mg Oral q1800  . budesonide-formoterol  2 puff Inhalation BID  . calcitRIOL  0.25 mcg Oral Daily  . darbepoetin (ARANESP) injection - DIALYSIS  200 mcg Intravenous Q Thu-HD  . enoxaparin (LOVENOX) injection  90 mg Subcutaneous Q24H  . insulin aspart  0-15 Units Subcutaneous TID WC  . insulin aspart  0-5 Units Subcutaneous QHS  . ipratropium-albuterol  3 mL Nebulization TID  . metoprolol  25 mg Oral BID  . sodium chloride  500 mL Intravenous Once  . sodium chloride  3 mL Intravenous Q12H  . tiotropium  18 mcg Inhalation Daily  . warfarin  7.5 mg Oral ONCE-1800  . Warfarin - Pharmacist Dosing Inpatient   Does not apply q1800   Continuous Infusions: . sodium chloride 10 mL/hr at 05/21/15 0946     Barton Dubois, MD  [facility] Pager [phone]  If 7PM-7AM, please contact night-coverage [email or web address] Password  [email or web address] 05/23/2015, 4:18 PM   LOS: 9 days

## 2015-05-24 ENCOUNTER — Inpatient Hospital Stay (HOSPITAL_COMMUNITY): Payer: Medicare PPO

## 2015-05-24 LAB — PROTIME-INR
INR: 1.17 (ref 0.00–1.49)
Prothrombin Time: 15 seconds (ref 11.6–15.2)

## 2015-05-24 LAB — RENAL FUNCTION PANEL
Albumin: 2.8 g/dL — ABNORMAL LOW (ref 3.5–5.0)
Anion gap: 11 (ref 5–15)
BUN: 35 mg/dL — AB (ref 6–20)
CALCIUM: 8.1 mg/dL — AB (ref 8.9–10.3)
CHLORIDE: 96 mmol/L — AB (ref 101–111)
CO2: 26 mmol/L (ref 22–32)
CREATININE: 4 mg/dL — AB (ref 0.61–1.24)
GFR calc non Af Amer: 13 mL/min — ABNORMAL LOW (ref >60)
GFR, EST AFRICAN AMERICAN: 15 mL/min — AB (ref >60)
Glucose, Bld: 154 mg/dL — ABNORMAL HIGH (ref 65–99)
Phosphorus: 4.4 mg/dL (ref 2.5–4.6)
Potassium: 3.6 mmol/L (ref 3.5–5.1)
SODIUM: 133 mmol/L — AB (ref 135–145)

## 2015-05-24 LAB — GLUCOSE, CAPILLARY
GLUCOSE-CAPILLARY: 127 mg/dL — AB (ref 65–99)
GLUCOSE-CAPILLARY: 133 mg/dL — AB (ref 65–99)
GLUCOSE-CAPILLARY: 92 mg/dL (ref 65–99)
Glucose-Capillary: 134 mg/dL — ABNORMAL HIGH (ref 65–99)

## 2015-05-24 LAB — CBC
HCT: 26.5 % — ABNORMAL LOW (ref 39.0–52.0)
Hemoglobin: 8.7 g/dL — ABNORMAL LOW (ref 13.0–17.0)
MCH: 30.9 pg (ref 26.0–34.0)
MCHC: 32.8 g/dL (ref 30.0–36.0)
MCV: 94 fL (ref 78.0–100.0)
PLATELETS: 153 10*3/uL (ref 150–400)
RBC: 2.82 MIL/uL — ABNORMAL LOW (ref 4.22–5.81)
RDW: 15.5 % (ref 11.5–15.5)
WBC: 14.8 10*3/uL — ABNORMAL HIGH (ref 4.0–10.5)

## 2015-05-24 MED ORDER — HEPARIN SODIUM (PORCINE) 1000 UNIT/ML DIALYSIS: 1000.0000 [IU] | INTRAMUSCULAR | Status: DC | PRN

## 2015-05-24 MED ORDER — SODIUM CHLORIDE 0.9 % IV SOLN: 100.0000 mL | INTRAVENOUS | Status: DC | PRN

## 2015-05-24 MED ORDER — ALTEPLASE 2 MG IJ SOLR
2.0000 mg | Freq: Once | INTRAMUSCULAR | Status: DC | PRN
  Filled 2015-05-24: qty 2

## 2015-05-24 MED ORDER — PENTAFLUOROPROP-TETRAFLUOROETH EX AERO: 1.0000 "application " | INHALATION_SPRAY | CUTANEOUS | Status: DC | PRN

## 2015-05-24 MED ORDER — ALBUMIN HUMAN 25 % IV SOLN
INTRAVENOUS | Status: AC
  Filled 2015-05-24: qty 100

## 2015-05-24 MED ORDER — WARFARIN SODIUM 7.5 MG PO TABS
7.5000 mg | ORAL_TABLET | Freq: Once | ORAL | Status: AC
  Administered 2015-05-24: 7.5 mg via ORAL
  Filled 2015-05-24: qty 1

## 2015-05-24 MED ORDER — DARBEPOETIN ALFA 200 MCG/0.4ML IJ SOSY
PREFILLED_SYRINGE | INTRAMUSCULAR | Status: AC
  Administered 2015-05-24: 200 ug
  Filled 2015-05-24: qty 0.4

## 2015-05-24 MED ORDER — LIDOCAINE HCL (PF) 1 % IJ SOLN: 5.0000 mL | INTRAMUSCULAR | Status: DC | PRN

## 2015-05-24 MED ORDER — LIDOCAINE-PRILOCAINE 2.5-2.5 % EX CREA
1.0000 "application " | TOPICAL_CREAM | CUTANEOUS | Status: DC | PRN
  Filled 2015-05-24: qty 5

## 2015-05-24 MED ORDER — HEPARIN SODIUM (PORCINE) 1000 UNIT/ML DIALYSIS
20.0000 [IU]/kg | INTRAMUSCULAR | Status: DC | PRN
  Administered 2015-05-24: 1800 [IU] via INTRAVENOUS_CENTRAL

## 2015-05-24 MED ORDER — ALBUMIN HUMAN 25 % IV SOLN
25.0000 g | Freq: Once | INTRAVENOUS | Status: AC
  Administered 2015-05-24: 25 g via INTRAVENOUS

## 2015-05-24 NOTE — Progress Notes (Signed)
Assessment/Plan: 1 New start ESRD, dialysis dependent. S/p AVF RBC  Dr. Scot Dock 2 Anemia on esa/fe 3 HPTH on vit D 4 COPD  5 CAD 6 DJD 7 CHF/Vol overload  P HD again for volume Today...if he allows!  Subjective: Interval History: DIff breathing supine  Objective: Vital signs in last 24 hours: Temp:  [97.6 F (36.4 C)-100 F (37.8 C)] 98.2 F (36.8 C) (12/02 0524) Pulse Rate:  [83-117] 83 (12/02 1054) Resp:  [16-20] 16 (12/02 1054) BP: (82-136)/(40-61) 97/44 mmHg (12/02 1054) SpO2:  [93 %-98 %] 96 % (12/02 0806) Weight:  [89.6 kg (197 lb 8.5 oz)-93 kg (205 lb 0.4 oz)] 89.6 kg (197 lb 8.5 oz) (12/02 0200) Weight change: 2.8 kg (6 lb 2.8 oz)  Intake/Output from previous day: 12/01 0701 - 12/02 0700 In: 320 [P.O.:320] Out: 2503  Intake/Output this shift: Total I/O In: 120 [P.O.:120] Out: -   General appearance: alert and cooperative Nose: Nares normal. Septum midline. Mucosa normal. No drainage or sinus tenderness. Chest wall: no tenderness, tr presacral edema Extremities: edema 3+ pitting  Lab Results:  Recent Labs  05/22/15 0425 05/22/15 0700  WBC 19.0* 19.9*  HGB 8.7* 8.6*  HCT 26.7* 26.2*  PLT 202 199   BMET:  Recent Labs  05/22/15 0700  NA 132*  K 4.3  CL 95*  CO2 25  GLUCOSE 137*  BUN 81*  CREATININE 5.73*  CALCIUM 8.3*   No results for input(s): PTH in the last 72 hours. Iron Studies: No results for input(s): IRON, TIBC, TRANSFERRIN, FERRITIN in the last 72 hours. Studies/Results: No results found.  Scheduled: . atorvastatin  20 mg Oral q1800  . budesonide-formoterol  2 puff Inhalation BID  . calcitRIOL  0.25 mcg Oral Daily  . darbepoetin (ARANESP) injection - DIALYSIS  200 mcg Intravenous Q Thu-HD  . enoxaparin (LOVENOX) injection  90 mg Subcutaneous Q24H  . insulin aspart  0-15 Units Subcutaneous TID WC  . insulin aspart  0-5 Units Subcutaneous QHS  . ipratropium-albuterol  3 mL Nebulization TID  . metoprolol  25 mg Oral BID  .  sodium chloride  500 mL Intravenous Once  . sodium chloride  3 mL Intravenous Q12H  . tiotropium  18 mcg Inhalation Daily  . warfarin  7.5 mg Oral ONCE-1800  . Warfarin - Pharmacist Dosing Inpatient   Does not apply q1800      LOS: 10 days   Caralee Morea C 05/24/2015,1:43 PM

## 2015-05-24 NOTE — Progress Notes (Signed)
ANTICOAGULATION CONSULT NOTE - Follow Up Consult  Pharmacy Consult for Lovenox and warfarin Indication: atrial fibrillation  Allergies  Allergen Reactions  . Yellow Dyes (Non-Tartrazine) Other (See Comments)    Burns arms  . Levaquin [Levofloxacin In D5w] Itching    Patient Measurements: Height: 5\' 8"  (172.7 cm) Weight: 197 lb 8.5 oz (89.6 kg) IBW/kg (Calculated) : 68.4  Vital Signs: Temp: 98.2 F (36.8 C) (12/02 0524) Temp Source: Oral (12/02 0524) BP: 97/44 mmHg (12/02 1054) Pulse Rate: 83 (12/02 1054)  Labs:  Recent Labs  05/22/15 0425 05/22/15 0700 05/23/15 0513 05/24/15 0632  HGB 8.7* 8.6*  --   --   HCT 26.7* 26.2*  --   --   PLT 202 199  --   --   LABPROT 15.2  --  15.0 15.0  INR 1.18  --  1.17 1.17  CREATININE  --  5.73*  --   --     Estimated Creatinine Clearance: 11.6 mL/min (by C-G formula based on Cr of 5.73).   Assessment: 80 y/o male with CKD V who was on apixiban PTA for Afib who has now transitioned to ESRD and is HD dependent. CHADS-VASc score is 6. He is on bridge from Lovenox to warfarin. INR is subtherapeutic at 1.17 after first dose. No bleeding noted, CBC due tomorrow.  Goal of Therapy:  INR 2-3 Anti-Xa level 0.6-1 units/ml 4hrs after LMWH dose given Monitor platelets by anticoagulation protocol: Yes   Plan:  - Continue Lovenox 90 mg SQ q24h  - will adjust as weight changes - Warfarin 7.5 mg PO tonight - Daily INR, CBC q72h while on Lovenox - Monitor for s/sx of bleeding  [facility], Pharm.D., BCPS Clinical Pharmacist Pager: [phone] 05/24/2015 11:31 AM

## 2015-05-24 NOTE — Progress Notes (Signed)
PROGRESS NOTE  Terry [name] [id number] DOB: 11/28/1934 DOA: 05/13/2015 PCP: [name], DO  Brief history 80 year old male with a history of COPD, hypertension, CAD/CABG, CKD stage IV, atrial fibrillation diagnosed October 2015 on apixaban presented with one-week history of worsening cough and shortness of breath. The patient was seen by his primary care provider 4 days prior to admission and prescribed Augmentin and prednisone without improvement. He presented to the emergency department where he was noted to have oxygen saturation of 80% on room air and found to be in respiratory distress. The patient was started on doxycycline and Solu-Medrol. The patient has also been describing symptoms of orthopnea and PND with increasing abdominal girth and lower extremity edema for the past week prior to admission. After admission, the patient was started on intravenous steroids. His breathing did not improve. He subsequently started on intravenous furosemide for volume excess. Ultimately, the patient was declared ESRD and dialysis catheter was placed and dialysis initiated on 05/17/2015.  Assessment/Plan: Acute respiratory failure with hypoxia -Secondary to CHF and COPD -Presently stable on 3 L nasal cannula -Wean oxygen for saturation greater than 90-92% -continue Pulmonary hygiene and treatment for COPD/HD for volume control   Acute diastolic CHF  -remains clinically volume overloaded  -furosemide d/c now that pt is on HD -05/16/2015 Echocardiogram--EF 55-60%, trivial MR/TR -Daily weights and strict intake and output  -Volume now manage with HD -advise to follow low sodium diet   Acute on chronic renal failure (CKD 4-5)-->now new on ESRD -Previous Baseline creatinine 3.2-3.4 -Appreciate nephrology-->S/P fistula creation [id number]; R brachiocephalic AVF  -[id number] placed by radiology -05/22/15--fifth HD--klonopin 1 mg prior to HD for anxiety and cramping; next  treatment 11-31  COPD exacerbation  -Patient has >60 pk years -overall improved and breathing a lot easier  -has completed steroids tapering  -continue Pulmonary hygiene and Continue bronchodilators --pt intermittenly refusing -Wean oxygen for saturation greater than 90-92% --will assess for home needs  Atrial fibrillation  -CHADS-VASc = 6 -Discontinue apixaban due to worsening renal function/new ESRD -continue lovenox and coumadin bridge -Continue metoprolol tartrate for rate control  -pharmacy helping with dose  Hypertension -Continue metoprolol tartrate at adjusted dose -advise to follow low sodium diet -HD helping with volume and BP control  Diabetes mellitus type 2 -Checked hemoglobin A1c--6.6 -pt agreeable to start po meds upon d/c (low dose glipizide or glyburide) -NovoLog sliding scale while inpatient -advise to follow low carb diet   Coronary artery disease -Cardiac cath 11/30/1997. Mild distal Left main stenosis, 25% proximal LAD, minor irregularities Circumflex, 95% mid RCA stenosis treated with 3.5 x 32 mm bare metal stent -no angina presently -will monitor  Hyponatremia -Secondary to worsening renal function and fluid overload -improving with HD -will follow renal service rec's -plan is for HD today if patient allows it  Leukocytosis -due to steroids in part -afebrile and hemodynamically stable -no fever -trending down  Family Communication:no family at bedside  Disposition Plan: CLIPPED with [facility] HD outpatient unit. Still significantly fluid overload; per renal service will need further inpatient treatment before discharge; continue HD as recommended by renal service. Continue lovenox and coumadin   Procedures/Studies: Dg Chest 2 View  05/13/2015  CLINICAL DATA:  80 year old with 1 week history of productive cough and severe shortness of breath. Former smoker with current history of COPD. EXAM: CHEST  2 VIEW COMPARISON:   05/28/2014 and earlier. FINDINGS: AP erect and lateral imaging was  performed. Cardiac silhouette mildly enlarged with a right coronary artery stent. Thoracic aorta atherosclerotic, unchanged. Prominent central pulmonary arteries, unchanged. Scarring in the lower lobes, right greater than left, unchanged. Hyperinflation, emphysematous changes in the upper lobes, and mild central peribronchial thickening, unchanged. No new pulmonary parenchymal abnormalities. Degenerative disc disease and spondylosis involving the thoracic spine. IMPRESSION: COPD/emphysema. Scarring in the lower lobes, right greater than left. No acute cardiopulmonary disease. Stable examination. Electronically Signed   By: Evangeline Dakin M.D.   On: 05/13/2015 21:03   US Renal  05/15/2015  CLINICAL DATA:  Acute renal injury EXAM: RENAL / URINARY TRACT ULTRASOUND COMPLETE COMPARISON:  10/24/2014 FINDINGS: Right Kidney: Length: 14.8 cm. Multiple cysts are noted. The largest of these measures 2.2 cm in greatest dimension. Left Kidney: Length: 14.0 cm. Multiple cysts are noted. The largest of these measures 3.1 cm in greatest dimension. Bladder: Appears normal for degree of bladder distention. IMPRESSION: Bilateral renal cysts similar to that seen on prior MRI examination. No acute obstructive changes are noted. Electronically Signed   By: Inez Catalina M.D.   On: 05/15/2015 16:29   Ir Fluoro Guide Cv Line Right  05/17/2015  INDICATION: Chronic kidney disease with early uremia. Need to start hemodialysis. EXAM: FLUOROSCOPIC AND ULTRASOUND GUIDED PLACEMENT OF A TUNNELED DIALYSIS CATHETER Physician: Stephan Minister. Henn, MD FLUOROSCOPY TIME:  42 seconds, 13.2 mGy MEDICATIONS: 2 g Ancef. As antibiotic prophylaxis, Ancef was ordered pre-procedure and administered intravenously within one hour of incision. 0.5 mg versed, 25 mcg fentanyl. A radiology nurse monitored the patient for moderate sedation. ANESTHESIA/SEDATION: Moderate sedation time: 30 minutes  PROCEDURE: Informed consent was obtained for placement of a tunneled dialysis catheter. The patient was placed supine on the interventional table. Ultrasound confirmed a patent right internal jugularvein. Ultrasound images were obtained for documentation. The right side of the neck was prepped and draped in a sterile fashion. The right side of the neck was anesthetized with 1% lidocaine. Maximal barrier sterile technique was utilized including caps, mask, sterile gowns, sterile gloves, sterile drape, hand hygiene and skin antiseptic. A small incision was made with #11 blade scalpel. A 21 gauge needle directed into the right internal jugular vein with ultrasound guidance. A micropuncture dilator set was placed. A 23 cm tip to cuff Palindrome catheter was selected. The skin below the right clavicle was anesthetized and a small incision was made with an #11 blade scalpel. A subcutaneous tunnel was formed to the vein dermatotomy site. The catheter was brought through the tunnel. The vein dermatotomy site was dilated to accommodate a peel-away sheath. The catheter was placed through the peel-away sheath and directed into the central venous structures. The tip of the catheter was placed at the superior cavoatrial junction with fluoroscopy. Fluoroscopic images were obtained for documentation. Both lumens were found to aspirate and flush well. The proper amount of heparin was flushed in both lumens. The vein dermatotomy site was closed using a single layer of absorbable suture and Dermabond. The catheter was secured to the skin using Prolene suture. FINDINGS: Catheter tip at the superior cavoatrial junction. Estimated blood loss: Minimal COMPLICATIONS: None IMPRESSION: Successful placement of a right jugular tunneled dialysis catheter using ultrasound and fluoroscopic guidance. Electronically Signed   By: Markus Daft M.D.   On: 05/17/2015 16:23   Ir US Guide Vasc Access Right  05/17/2015  INDICATION: Chronic kidney  disease with early uremia. Need to start hemodialysis. EXAM: FLUOROSCOPIC AND ULTRASOUND GUIDED PLACEMENT OF A TUNNELED DIALYSIS CATHETER Physician: Stephan Minister.  Henn, MD FLUOROSCOPY TIME:  42 seconds, 13.2 mGy MEDICATIONS: 2 g Ancef. As antibiotic prophylaxis, Ancef was ordered pre-procedure and administered intravenously within one hour of incision. 0.5 mg versed, 25 mcg fentanyl. A radiology nurse monitored the patient for moderate sedation. ANESTHESIA/SEDATION: Moderate sedation time: 30 minutes PROCEDURE: Informed consent was obtained for placement of a tunneled dialysis catheter. The patient was placed supine on the interventional table. Ultrasound confirmed a patent right internal jugularvein. Ultrasound images were obtained for documentation. The right side of the neck was prepped and draped in a sterile fashion. The right side of the neck was anesthetized with 1% lidocaine. Maximal barrier sterile technique was utilized including caps, mask, sterile gowns, sterile gloves, sterile drape, hand hygiene and skin antiseptic. A small incision was made with #11 blade scalpel. A 21 gauge needle directed into the right internal jugular vein with ultrasound guidance. A micropuncture dilator set was placed. A 23 cm tip to cuff Palindrome catheter was selected. The skin below the right clavicle was anesthetized and a small incision was made with an #11 blade scalpel. A subcutaneous tunnel was formed to the vein dermatotomy site. The catheter was brought through the tunnel. The vein dermatotomy site was dilated to accommodate a peel-away sheath. The catheter was placed through the peel-away sheath and directed into the central venous structures. The tip of the catheter was placed at the superior cavoatrial junction with fluoroscopy. Fluoroscopic images were obtained for documentation. Both lumens were found to aspirate and flush well. The proper amount of heparin was flushed in both lumens. The vein dermatotomy site was  closed using a single layer of absorbable suture and Dermabond. The catheter was secured to the skin using Prolene suture. FINDINGS: Catheter tip at the superior cavoatrial junction. Estimated blood loss: Minimal COMPLICATIONS: None IMPRESSION: Successful placement of a right jugular tunneled dialysis catheter using ultrasound and fluoroscopic guidance. Electronically Signed   By: Markus Daft M.D.   On: 05/17/2015 16:23    Subjective: No CP, no fever. Patient still with signs of fluid overload. Has been accepted for outpatient facility at [facility] outpatient HD unit. Reports breathing is stable. Super weak and deconditioned   Objective: Filed Vitals:   05/24/15 1700 05/24/15 1705 05/24/15 1730 05/24/15 1807  BP: 88/30 109/49 101/47 95/53  Pulse: 94 103 97 97  Temp:    98.2 F (36.8 C)  TempSrc:      Resp:    20  Height:      Weight:      SpO2:    98%    Intake/Output Summary (Last 24 hours) at 05/24/15 1835 Last data filed at 05/24/15 1807  Gross per 24 hour  Intake    120 ml  Output   4003 ml  Net  -3883 ml   Weight change: 2.8 kg (6 lb 2.8 oz) Exam:   General:  Pt is alert, follows commands appropriately, not in acute distress. Feeling super tired and weak. With improvement in swelling, but still fluid overload on exam  HEENT: No icterus, No thrush, No neck mass, No ears or nostrils discharge  Cardiovascular: RRR, S1/S2, no rubs, no gallops; mild JVD on exam  Respiratory: Scattered bilateral rales and rhonchi; no wheezing. Fair air movement   Abdomen: Soft/+BS, non tender, non distended, no guarding; no hepatosplenomegaly  Extremities: 2+ LE edema, No lymphangitis, No petechiae, No rashes, no synovitis  Data Reviewed: Basic Metabolic Panel:  Recent Labs Lab 05/17/15 2156 05/18/15 1835 05/19/15 0442 05/20/15 0718 05/21/15  0940 05/22/15 0700 05/24/15 1400  NA 124* 127* 133* 131* 131* 132* 133*  K 3.2* 3.3* 3.4* 3.1* 3.7 4.3 3.6  CL 89* 91* 94* 93*  --  95*  96*  CO2 21* 24 28 26   --  25 26  GLUCOSE 155* 203* 130* 103* 121* 137* 154*  BUN 127* 92* 64* 89*  --  81* 35*  CREATININE 5.99* 4.67* 3.88* 5.08*  --  5.73* 4.00*  CALCIUM 7.3* 7.5* 8.1* 8.3*  --  8.3* 8.1*  PHOS 8.2* 6.2*  --  7.1*  --  6.9* 4.4   Liver Function Tests:  Recent Labs Lab 05/17/15 2156 05/18/15 1835 05/20/15 0718 05/22/15 0700 05/24/15 1400  ALBUMIN 3.0* 3.0* 3.1* 3.0* 2.8*   CBC:  Recent Labs Lab 05/20/15 0718 05/21/15 0534 05/21/15 0940 05/22/15 0425 05/22/15 0700 05/24/15 1400  WBC 18.5* 22.8*  --  19.0* 19.9* 14.8*  HGB 9.5* 9.8* 9.9* 8.7* 8.6* 8.7*  HCT 27.3* 28.8* 29.0* 26.7* 26.2* 26.5*  MCV 88.1 88.3  --  90.8 90.7 94.0  PLT 210 223  --  202 199 153   CBG:  Recent Labs Lab 05/23/15 1144 05/23/15 1710 05/23/15 2156 05/24/15 0759 05/24/15 1231  GLUCAP 132* 99 171* 127* 133*    Recent Results (from the past 240 hour(s))  Surgical pcr screen     Status: Abnormal   Collection Time: 05/21/15  5:09 AM  Result Value Ref Range Status   MRSA, PCR POSITIVE (A) NEGATIVE Final   Staphylococcus aureus POSITIVE (A) NEGATIVE Final    Comment:        The Xpert SA Assay (FDA approved for NASAL specimens in patients over 23 years of age), is one component of a comprehensive surveillance program.  Test performance has been validated by [facility] for patients greater than or equal to 58 year old. It is not intended to diagnose infection nor to guide or monitor treatment.      Scheduled Meds: . atorvastatin  20 mg Oral q1800  . budesonide-formoterol  2 puff Inhalation BID  . calcitRIOL  0.25 mcg Oral Daily  . darbepoetin (ARANESP) injection - DIALYSIS  200 mcg Intravenous Q Thu-HD  . enoxaparin (LOVENOX) injection  90 mg Subcutaneous Q24H  . insulin aspart  0-15 Units Subcutaneous TID WC  . insulin aspart  0-5 Units Subcutaneous QHS  . ipratropium-albuterol  3 mL Nebulization TID  . metoprolol  25 mg Oral BID  . sodium chloride  500  mL Intravenous Once  . sodium chloride  3 mL Intravenous Q12H  . tiotropium  18 mcg Inhalation Daily  . warfarin  7.5 mg Oral ONCE-1800  . Warfarin - Pharmacist Dosing Inpatient   Does not apply q1800   Continuous Infusions: . sodium chloride 10 mL/hr at 05/21/15 0946     Barton Dubois, MD  [facility] Pager [phone]  If 7PM-7AM, please contact night-coverage [email or web address] Password [facility] 05/24/2015, 6:35 PM   LOS: 10 days

## 2015-05-24 NOTE — Care Management Important Message (Signed)
Important Message  Patient Details  Name: Terry [name] MRN: [id number] Date of Birth: [birth year]   Medicare Important Message Given:  Yes    Zenon Mayo, RN 05/24/2015, 4:47 [place] Message  Patient Details  Name: Terry [name] MRN: [id number] Date of Birth: 1934/08/20   Medicare Important Message Given:  Yes    Zenon Mayo, RN 05/24/2015, 4:47 PM

## 2015-05-24 NOTE — Discharge Instructions (Signed)
05/21/2015 CLINT GRONEMEYER [id number] [birth year]  Surgeon(s): Angelia Mould, MD  Procedure(s): Right Arm Brachiocephalic ARTERIOVENOUS (AV) FISTULA CREATION  x Do not stick fistula for 12 weeks    Information on my medicine - Coumadin   (Warfarin)  This medication education was reviewed with me or my healthcare representative as part of my discharge preparation.  The pharmacist that spoke with me during my hospital stay was: Anderson Malta   Why was Coumadin prescribed for you? Coumadin was prescribed for you because you have a blood clot or a medical condition that can cause an increased risk of forming blood clots. Blood clots can cause serious health problems by blocking the flow of blood to the heart, lung, or brain. Coumadin can prevent harmful blood clots from forming. As a reminder your indication for Coumadin is:   Stroke Prevention Because Of Atrial Fibrillation  What test will check on my response to Coumadin? While on Coumadin (warfarin) you will need to have an INR test regularly to ensure that your dose is keeping you in the desired range. The INR (international normalized ratio) number is calculated from the result of the laboratory test called prothrombin time (PT).  If an INR APPOINTMENT HAS NOT ALREADY BEEN MADE FOR YOU please schedule an appointment to have this lab work done by your health care provider within 7 days. Your INR goal is usually a number between:  2 to 3 or your provider may give you a more narrow range like 2-2.5.  Ask your health care provider during an office visit what your goal INR is.  What  do you need to  know  About  COUMADIN? Take Coumadin (warfarin) exactly as prescribed by your healthcare provider about the same time each day.  DO NOT stop taking without talking to the doctor who prescribed the medication.  Stopping without other blood clot prevention medication to take the place of Coumadin may increase your risk of developing a new clot  or stroke.  Get refills before you run out.  What do you do if you miss a dose? If you miss a dose, take it as soon as you remember on the same day then continue your regularly scheduled regimen the next day.  Do not take two doses of Coumadin at the same time.  Important Safety Information A possible side effect of Coumadin (Warfarin) is an increased risk of bleeding. You should call your healthcare provider right away if you experience any of the following: ? Bleeding from an injury or your nose that does not stop. ? Unusual colored urine (red or dark brown) or unusual colored stools (red or black). ? Unusual bruising for unknown reasons. ? A serious fall or if you hit your head (even if there is no bleeding).  Some foods or medicines interact with Coumadin (warfarin) and might alter your response to warfarin. To help avoid this: ? Eat a balanced diet, maintaining a consistent amount of Vitamin K. ? Notify your provider about major diet changes you plan to make. ? Avoid alcohol or limit your intake to 1 drink for women and 2 drinks for men per day. (1 drink is 5 oz. wine, 12 oz. beer, or 1.5 oz. liquor.)  Make sure that ANY health care provider who prescribes medication for you knows that you are taking Coumadin (warfarin).  Also make sure the healthcare provider who is monitoring your Coumadin knows when you have started a new medication including herbals and non-prescription products.  Coumadin (Warfarin)  Major Drug Interactions  Increased Warfarin Effect Decreased Warfarin Effect  Alcohol (large quantities) Antibiotics (esp. Septra/Bactrim, Flagyl, Cipro) Amiodarone (Cordarone) Aspirin (ASA) Cimetidine (Tagamet) Megestrol (Megace) NSAIDs (ibuprofen, naproxen, etc.) Piroxicam (Feldene) Propafenone (Rythmol SR) Propranolol (Inderal) Isoniazid (INH) Posaconazole (Noxafil) Barbiturates (Phenobarbital) Carbamazepine (Tegretol) Chlordiazepoxide (Librium) Cholestyramine  (Questran) Griseofulvin Oral Contraceptives Rifampin Sucralfate (Carafate) Vitamin K   Coumadin (Warfarin) Major Herbal Interactions  Increased Warfarin Effect Decreased Warfarin Effect  Garlic Ginseng Ginkgo biloba Coenzyme Q10 Green tea St. Johns wort    Coumadin (Warfarin) FOOD Interactions  Eat a consistent number of servings per week of foods HIGH in Vitamin K (1 serving =  cup)  Collards (cooked, or boiled & drained) Kale (cooked, or boiled & drained) Mustard greens (cooked, or boiled & drained) Parsley *serving size only =  cup Spinach (cooked, or boiled & drained) Swiss chard (cooked, or boiled & drained) Turnip greens (cooked, or boiled & drained)  Eat a consistent number of servings per week of foods MEDIUM-HIGH in Vitamin K (1 serving = 1 cup)  Asparagus (cooked, or boiled & drained) Broccoli (cooked, boiled & drained, or raw & chopped) Brussel sprouts (cooked, or boiled & drained) *serving size only =  cup Lettuce, raw (green leaf, endive, romaine) Spinach, raw Turnip greens, raw & chopped   These websites have more information on Coumadin (warfarin):  FailFactory.se; VeganReport.com.au;

## 2015-05-25 LAB — RENAL FUNCTION PANEL
ALBUMIN: 3 g/dL — AB (ref 3.5–5.0)
ANION GAP: 9 (ref 5–15)
BUN: 34 mg/dL — AB (ref 6–20)
CHLORIDE: 97 mmol/L — AB (ref 101–111)
CO2: 26 mmol/L (ref 22–32)
Calcium: 8 mg/dL — ABNORMAL LOW (ref 8.9–10.3)
Creatinine, Ser: 4.51 mg/dL — ABNORMAL HIGH (ref 0.61–1.24)
GFR calc Af Amer: 13 mL/min — ABNORMAL LOW (ref >60)
GFR, EST NON AFRICAN AMERICAN: 11 mL/min — AB (ref >60)
GLUCOSE: 101 mg/dL — AB (ref 65–99)
PHOSPHORUS: 3.9 mg/dL (ref 2.5–4.6)
POTASSIUM: 4.1 mmol/L (ref 3.5–5.1)
Sodium: 132 mmol/L — ABNORMAL LOW (ref 135–145)

## 2015-05-25 LAB — GLUCOSE, CAPILLARY
GLUCOSE-CAPILLARY: 114 mg/dL — AB (ref 65–99)
GLUCOSE-CAPILLARY: 95 mg/dL (ref 65–99)
Glucose-Capillary: 180 mg/dL — ABNORMAL HIGH (ref 65–99)
Glucose-Capillary: 114 mg/dL — ABNORMAL HIGH (ref 65–99)

## 2015-05-25 LAB — CBC
HCT: 28 % — ABNORMAL LOW (ref 39.0–52.0)
HEMOGLOBIN: 8.6 g/dL — AB (ref 13.0–17.0)
MCH: 29.9 pg (ref 26.0–34.0)
MCHC: 30.7 g/dL (ref 30.0–36.0)
MCV: 97.2 fL (ref 78.0–100.0)
Platelets: 173 10*3/uL (ref 150–400)
RBC: 2.88 MIL/uL — AB (ref 4.22–5.81)
RDW: 15.9 % — ABNORMAL HIGH (ref 11.5–15.5)
WBC: 12.6 10*3/uL — AB (ref 4.0–10.5)

## 2015-05-25 LAB — PROTIME-INR
INR: 1.37 (ref 0.00–1.49)
PROTHROMBIN TIME: 16.9 s — AB (ref 11.6–15.2)

## 2015-05-25 MED ORDER — LIDOCAINE HCL (PF) 1 % IJ SOLN: 5.0000 mL | INTRAMUSCULAR | Status: DC | PRN

## 2015-05-25 MED ORDER — METAXALONE 400 MG HALF TABLET
400.0000 mg | ORAL_TABLET | Freq: Once | ORAL | Status: AC
  Administered 2015-05-25: 400 mg via ORAL
  Filled 2015-05-25: qty 1

## 2015-05-25 MED ORDER — ALTEPLASE 2 MG IJ SOLR
2.0000 mg | Freq: Once | INTRAMUSCULAR | Status: DC | PRN
  Filled 2015-05-25: qty 2

## 2015-05-25 MED ORDER — WARFARIN SODIUM 7.5 MG PO TABS
7.5000 mg | ORAL_TABLET | Freq: Once | ORAL | Status: AC
  Administered 2015-05-25: 7.5 mg via ORAL
  Filled 2015-05-25: qty 1

## 2015-05-25 MED ORDER — PENTAFLUOROPROP-TETRAFLUOROETH EX AERO: 1.0000 "application " | INHALATION_SPRAY | CUTANEOUS | Status: DC | PRN

## 2015-05-25 MED ORDER — SODIUM CHLORIDE 0.9 % IV SOLN: 100.0000 mL | INTRAVENOUS | Status: DC | PRN

## 2015-05-25 MED ORDER — LIDOCAINE-PRILOCAINE 2.5-2.5 % EX CREA: 1.0000 "application " | TOPICAL_CREAM | CUTANEOUS | Status: DC | PRN

## 2015-05-25 MED ORDER — HEPARIN SODIUM (PORCINE) 1000 UNIT/ML DIALYSIS: 20.0000 [IU]/kg | INTRAMUSCULAR | Status: DC | PRN

## 2015-05-25 MED ORDER — MAGIC MOUTHWASH
5.0000 mL | Freq: Four times a day (QID) | ORAL | Status: DC
  Administered 2015-05-25 – 2015-06-04 (×35): 5 mL via ORAL
  Filled 2015-05-25 (×36): qty 5

## 2015-05-25 MED ORDER — METHOCARBAMOL 1000 MG/10ML IJ SOLN
500.0000 mg | Freq: Three times a day (TID) | INTRAMUSCULAR | Status: DC | PRN
  Filled 2015-05-25: qty 5

## 2015-05-25 MED ORDER — HEPARIN SODIUM (PORCINE) 1000 UNIT/ML DIALYSIS
1000.0000 [IU] | INTRAMUSCULAR | Status: DC | PRN
  Filled 2015-05-25: qty 1

## 2015-05-25 MED ORDER — METHOCARBAMOL 500 MG PO TABS
500.0000 mg | ORAL_TABLET | Freq: Three times a day (TID) | ORAL | Status: DC | PRN
Start: 2015-05-25 — End: 2015-06-04
  Administered 2015-05-25 – 2015-06-03 (×5): 500 mg via ORAL
  Filled 2015-05-25 (×5): qty 1

## 2015-05-25 MED ORDER — LIDOCAINE-PRILOCAINE 2.5-2.5 % EX CREA
1.0000 "application " | TOPICAL_CREAM | CUTANEOUS | Status: DC | PRN
  Filled 2015-05-25: qty 5

## 2015-05-25 MED ORDER — LIDOCAINE HCL (PF) 1 % IJ SOLN
5.0000 mL | INTRAMUSCULAR | Status: DC | PRN
  Filled 2015-05-25: qty 5

## 2015-05-25 MED ORDER — HEPARIN SODIUM (PORCINE) 1000 UNIT/ML DIALYSIS: 1000.0000 [IU] | INTRAMUSCULAR | Status: DC | PRN

## 2015-05-25 MED ORDER — HEPARIN SODIUM (PORCINE) 1000 UNIT/ML DIALYSIS
20.0000 [IU]/kg | INTRAMUSCULAR | Status: DC | PRN
  Filled 2015-05-25: qty 2

## 2015-05-25 MED ORDER — ALTEPLASE 2 MG IJ SOLR: 2.0000 mg | Freq: Once | INTRAMUSCULAR | Status: DC | PRN

## 2015-05-25 NOTE — Progress Notes (Signed)
Pt BP 169/53 before his blood pressure medicine was given, i wanted to recheck BP again after an hour of medication, pt was deeply asleep and his wife dont want me to wake him up, i will recheck his BP when pt is awake

## 2015-05-25 NOTE — Progress Notes (Signed)
Assessment/Plan: 1 New start ESRD, dialysis dependent. S/p AVF RBC Dr. Scot Dock 2 Anemia on esa/fe 3 HPTH on vit D 4 COPD  5 CAD 6 DJD 7 CHF/Vol overload  8 Oral thrush P HD again for volume Today...if he allows! [facility]   Subjective: Interval History: painful mouth, doesn't like dialysis frequency  Objective: Vital signs in last 24 hours: Temp:  [98.1 F (36.7 C)-99 F (37.2 C)] 99 F (37.2 C) (12/03 0714) Pulse Rate:  [84-109] 95 (12/03 0955) Resp:  [20] 20 (12/02 2152) BP: (82-134)/(30-103) 110/41 mmHg (12/03 0955) SpO2:  [95 %-100 %] 97 % (12/03 0714) Weight change:   Intake/Output from previous day: 12/02 0701 - 12/03 0700 In: 120 [P.O.:120] Out: 1500  Intake/Output this shift: Total I/O In: 200 [P.O.:200] Out: -   General appearance: alert and cooperative Chest wall: no tenderness, tr presacral Extremities: edema 2-3+ pitting  Lab Results:  Recent Labs  05/24/15 1400 05/25/15 0529  WBC 14.8* 12.6*  HGB 8.7* 8.6*  HCT 26.5* 28.0*  PLT 153 173   BMET:  Recent Labs  05/24/15 1400  NA 133*  K 3.6  CL 96*  CO2 26  GLUCOSE 154*  BUN 35*  CREATININE 4.00*  CALCIUM 8.1*   No results for input(s): PTH in the last 72 hours. Iron Studies: No results for input(s): IRON, TIBC, TRANSFERRIN, FERRITIN in the last 72 hours. Studies/Results: Dg Chest Port 1 View  05/24/2015  CLINICAL DATA:  Shortness of breath. Finished hemodialysis 1 hour previously. EXAM: PORTABLE CHEST 1 VIEW COMPARISON:  05/13/2015. FINDINGS: Normal sized heart. Clear lungs. Interval right jugular catheter with its tip in the inferior aspect of the superior vena cava. No pneumothorax. Mild right basilar atelectasis and patchy opacity with mild progression. Clear left lung. Mild lower thoracic spine degenerative changes. IMPRESSION: Mild right basilar atelectasis and possible pneumonia. Electronically Signed   By: Claudie Revering M.D.   On: 05/24/2015 20:50    Scheduled: . atorvastatin   20 mg Oral q1800  . budesonide-formoterol  2 puff Inhalation BID  . calcitRIOL  0.25 mcg Oral Daily  . darbepoetin (ARANESP) injection - DIALYSIS  200 mcg Intravenous Q Thu-HD  . enoxaparin (LOVENOX) injection  90 mg Subcutaneous Q24H  . insulin aspart  0-15 Units Subcutaneous TID WC  . insulin aspart  0-5 Units Subcutaneous QHS  . ipratropium-albuterol  3 mL Nebulization TID  . magic mouthwash  5 mL Oral QID  . metoprolol  25 mg Oral BID  . sodium chloride  500 mL Intravenous Once  . sodium chloride  3 mL Intravenous Q12H  . tiotropium  18 mcg Inhalation Daily  . warfarin  7.5 mg Oral ONCE-1800  . Warfarin - Pharmacist Dosing Inpatient   Does not apply q1800     LOS: 11 days   Torell Minder C 05/25/2015,12:35 PM

## 2015-05-25 NOTE — Progress Notes (Signed)
PROGRESS NOTE  Terry [name] [id number] DOB: 1934/09/30 DOA: 05/13/2015 PCP: [name], DO  Brief history 80 year old male with a history of COPD, hypertension, CAD/CABG, CKD stage IV, atrial fibrillation diagnosed October 2015 on apixaban presented with one-week history of worsening cough and shortness of breath. The patient was seen by his primary care provider 4 days prior to admission and prescribed Augmentin and prednisone without improvement. He presented to the emergency department where he was noted to have oxygen saturation of 80% on room air and found to be in respiratory distress. The patient was started on doxycycline and Solu-Medrol. The patient has also been describing symptoms of orthopnea and PND with increasing abdominal girth and lower extremity edema for the past week prior to admission. After admission, the patient was started on intravenous steroids. His breathing did not improve. He subsequently started on intravenous furosemide for volume excess. Ultimately, the patient was declared ESRD and dialysis catheter was placed and dialysis initiated on 05/17/2015.     Assessment/Plan: Acute respiratory failure with hypoxia -Secondary to CHF and COPD -Presently stable on 3 L nasal cannula,  -Wean oxygen for saturation greater than 90-92% -continue Pulmonary hygiene and treatment for COPD/HD for volume control   Acute diastolic CHF  -remains clinically volume overloaded  -furosemide d/c now that pt is on HD -05/16/2015 Echocardiogram--EF 55-60%, trivial MR/TR Weight 208 pounds on 11/22> 197 pounds on 12/2 -Volume now manage with HD -advise to follow low sodium diet    Acute on chronic renal failure (CKD 4-5)-->now new on ESRD -Previous Baseline creatinine 3.2-3.4 -Appreciate nephrology-->S/p right AVF  by Dr. Scot Dock 05/21/2015. -05/17/2015--PermCath placed by radiology -05/22/15--fifth HD--klonopin 1 mg prior to HD for anxiety and cramping; next  treatment 12/3 Robaxin for cramping with HD   COPD exacerbation  -Patient has >60 pk years -overall improved and breathing a lot easier  -has completed steroids tapering  -continue Pulmonary hygiene and Continue bronchodilators --pt intermittenly refusing -Wean oxygen for saturation greater than 90-92% --will assess for home needs   Atrial fibrillation  -CHADS-VASc = 6 -Discontinue apixaban due to worsening renal function/new ESRD -continue lovenox and coumadin bridge-INR 1.37 -Continue metoprolol tartrate for rate control  -pharmacy helping with dose   Hypertension -Continue metoprolol tartrate at adjusted dose -advise to follow low sodium diet -HD helping with volume and BP control  Diabetes mellitus type 2 -Checked hemoglobin A1c--6.6 -pt agreeable to start po meds upon d/c (low dose glipizide or glyburide) -NovoLog sliding scale while inpatient -advise to follow low carb diet   Coronary artery disease -Cardiac cath 11/30/1997. Mild distal Left main stenosis, 25% proximal LAD, minor irregularities Circumflex, 95% mid RCA stenosis treated with 3.5 x 32 mm bare metal stent -no angina presently -will monitor  Hyponatremia -Secondary to worsening renal function and fluid overload -improving with HD -will follow renal service rec's -plan is for HD today if patient allows it  Leukocytosis -due to steroids in part -afebrile and hemodynamically stable -no fever -trending down  Family Communication:no family at bedside  Disposition Plan: CLIPPED with [facility] HD outpatient unit. Still significantly fluid overload; per renal service will need further inpatient treatment before discharge; continue HD as recommended by renal service. Continue lovenox and coumadin   Procedures/Studies: Dg Chest 2 View  05/13/2015  CLINICAL DATA:  80 year old with 1 week history of productive cough and severe shortness of breath. Former smoker with current history of COPD.  EXAM: CHEST  2 VIEW COMPARISON:  05/28/2014 and earlier. FINDINGS: AP erect and lateral imaging was performed. Cardiac silhouette mildly enlarged with a right coronary artery stent. Thoracic aorta atherosclerotic, unchanged. Prominent central pulmonary arteries, unchanged. Scarring in the lower lobes, right greater than left, unchanged. Hyperinflation, emphysematous changes in the upper lobes, and mild central peribronchial thickening, unchanged. No new pulmonary parenchymal abnormalities. Degenerative disc disease and spondylosis involving the thoracic spine. IMPRESSION: COPD/emphysema. Scarring in the lower lobes, right greater than left. No acute cardiopulmonary disease. Stable examination. Electronically Signed   By: Evangeline Dakin M.D.   On: 05/13/2015 21:03   US Renal  05/15/2015  CLINICAL DATA:  Acute renal injury EXAM: RENAL / URINARY TRACT ULTRASOUND COMPLETE COMPARISON:  10/24/2014 FINDINGS: Right Kidney: Length: 14.8 cm. Multiple cysts are noted. The largest of these measures 2.2 cm in greatest dimension. Left Kidney: Length: 14.0 cm. Multiple cysts are noted. The largest of these measures 3.1 cm in greatest dimension. Bladder: Appears normal for degree of bladder distention. IMPRESSION: Bilateral renal cysts similar to that seen on prior MRI examination. No acute obstructive changes are noted. Electronically Signed   By: Inez Catalina M.D.   On: 05/15/2015 16:29   Ir Fluoro Guide Cv Line Right  05/17/2015  INDICATION: Chronic kidney disease with early uremia. Need to start hemodialysis. EXAM: FLUOROSCOPIC AND ULTRASOUND GUIDED PLACEMENT OF A TUNNELED DIALYSIS CATHETER Physician: Stephan Minister. Henn, MD FLUOROSCOPY TIME:  42 seconds, 13.2 mGy MEDICATIONS: 2 g Ancef. As antibiotic prophylaxis, Ancef was ordered pre-procedure and administered intravenously within one hour of incision. 0.5 mg versed, 25 mcg fentanyl. A radiology nurse monitored the patient for moderate sedation. ANESTHESIA/SEDATION:  Moderate sedation time: 30 minutes PROCEDURE: Informed consent was obtained for placement of a tunneled dialysis catheter. The patient was placed supine on the interventional table. Ultrasound confirmed a patent right internal jugularvein. Ultrasound images were obtained for documentation. The right side of the neck was prepped and draped in a sterile fashion. The right side of the neck was anesthetized with 1% lidocaine. Maximal barrier sterile technique was utilized including caps, mask, sterile gowns, sterile gloves, sterile drape, hand hygiene and skin antiseptic. A small incision was made with #11 blade scalpel. A 21 gauge needle directed into the right internal jugular vein with ultrasound guidance. A micropuncture dilator set was placed. A 23 cm tip to cuff Palindrome catheter was selected. The skin below the right clavicle was anesthetized and a small incision was made with an #11 blade scalpel. A subcutaneous tunnel was formed to the vein dermatotomy site. The catheter was brought through the tunnel. The vein dermatotomy site was dilated to accommodate a peel-away sheath. The catheter was placed through the peel-away sheath and directed into the central venous structures. The tip of the catheter was placed at the superior cavoatrial junction with fluoroscopy. Fluoroscopic images were obtained for documentation. Both lumens were found to aspirate and flush well. The proper amount of heparin was flushed in both lumens. The vein dermatotomy site was closed using a single layer of absorbable suture and Dermabond. The catheter was secured to the skin using Prolene suture. FINDINGS: Catheter tip at the superior cavoatrial junction. Estimated blood loss: Minimal COMPLICATIONS: None IMPRESSION: Successful placement of a right jugular tunneled dialysis catheter using ultrasound and fluoroscopic guidance. Electronically Signed   By: Markus Daft M.D.   On: 05/17/2015 16:23   Ir US Guide Vasc Access  Right  05/17/2015  INDICATION: Chronic kidney disease with early uremia. Need to start hemodialysis.  EXAM: FLUOROSCOPIC AND ULTRASOUND GUIDED PLACEMENT OF A TUNNELED DIALYSIS CATHETER Physician: Stephan Minister. Henn, MD FLUOROSCOPY TIME:  42 seconds, 13.2 mGy MEDICATIONS: 2 g Ancef. As antibiotic prophylaxis, Ancef was ordered pre-procedure and administered intravenously within one hour of incision. 0.5 mg versed, 25 mcg fentanyl. A radiology nurse monitored the patient for moderate sedation. ANESTHESIA/SEDATION: Moderate sedation time: 30 minutes PROCEDURE: Informed consent was obtained for placement of a tunneled dialysis catheter. The patient was placed supine on the interventional table. Ultrasound confirmed a patent right internal jugularvein. Ultrasound images were obtained for documentation. The right side of the neck was prepped and draped in a sterile fashion. The right side of the neck was anesthetized with 1% lidocaine. Maximal barrier sterile technique was utilized including caps, mask, sterile gowns, sterile gloves, sterile drape, hand hygiene and skin antiseptic. A small incision was made with #11 blade scalpel. A 21 gauge needle directed into the right internal jugular vein with ultrasound guidance. A micropuncture dilator set was placed. A 23 cm tip to cuff Palindrome catheter was selected. The skin below the right clavicle was anesthetized and a small incision was made with an #11 blade scalpel. A subcutaneous tunnel was formed to the vein dermatotomy site. The catheter was brought through the tunnel. The vein dermatotomy site was dilated to accommodate a peel-away sheath. The catheter was placed through the peel-away sheath and directed into the central venous structures. The tip of the catheter was placed at the superior cavoatrial junction with fluoroscopy. Fluoroscopic images were obtained for documentation. Both lumens were found to aspirate and flush well. The proper amount of heparin was flushed in  both lumens. The vein dermatotomy site was closed using a single layer of absorbable suture and Dermabond. The catheter was secured to the skin using Prolene suture. FINDINGS: Catheter tip at the superior cavoatrial junction. Estimated blood loss: Minimal COMPLICATIONS: None IMPRESSION: Successful placement of a right jugular tunneled dialysis catheter using ultrasound and fluoroscopic guidance. Electronically Signed   By: Markus Daft M.D.   On: 05/17/2015 16:23   Dg Chest Port 1 View  05/24/2015  CLINICAL DATA:  Shortness of breath. Finished hemodialysis 1 hour previously. EXAM: PORTABLE CHEST 1 VIEW COMPARISON:  05/13/2015. FINDINGS: Normal sized heart. Clear lungs. Interval right jugular catheter with its tip in the inferior aspect of the superior vena cava. No pneumothorax. Mild right basilar atelectasis and patchy opacity with mild progression. Clear left lung. Mild lower thoracic spine degenerative changes. IMPRESSION: Mild right basilar atelectasis and possible pneumonia. Electronically Signed   By: Claudie Revering M.D.   On: 05/24/2015 20:50    Subjective: No CP, no fever. Patient still with signs of fluid overload. Denies shortness of breath, complains of cramping  Objective: Filed Vitals:   05/24/15 1807 05/24/15 2049 05/24/15 2152 05/25/15 0714  BP: 95/53  121/63 91/40  Pulse: 97  101 96  Temp: 98.2 F (36.8 C)  98.5 F (36.9 C) 99 F (37.2 C)  TempSrc:   Oral Oral  Resp: 20  20   Height:      Weight:      SpO2: 98% 95% 100% 97%    Intake/Output Summary (Last 24 hours) at 05/25/15 0910 Last data filed at 05/24/15 1807  Gross per 24 hour  Intake    120 ml  Output   1500 ml  Net  -1380 ml   Weight change:  Exam:   General:  Pt is alert, follows commands appropriately, not in acute distress. Feeling super  tired and weak. With improvement in swelling, but still fluid overload on exam  HEENT: No icterus, No thrush, No neck mass, No ears or nostrils discharge  Cardiovascular:  RRR, S1/S2, no rubs, no gallops; mild JVD on exam  Respiratory: Scattered bilateral rales and rhonchi; no wheezing. Fair air movement   Abdomen: Soft/+BS, non tender, non distended, no guarding; no hepatosplenomegaly  Extremities: 2+ LE edema, No lymphangitis, No petechiae, No rashes, no synovitis  Data Reviewed: Basic Metabolic Panel:  Recent Labs Lab 05/18/15 1835 05/19/15 0442 05/20/15 0718 05/21/15 0940 05/22/15 0700 05/24/15 1400  NA 127* 133* 131* 131* 132* 133*  K 3.3* 3.4* 3.1* 3.7 4.3 3.6  CL 91* 94* 93*  --  95* 96*  CO2 24 28 26   --  25 26  GLUCOSE 203* 130* 103* 121* 137* 154*  BUN 92* 64* 89*  --  81* 35*  CREATININE 4.67* 3.88* 5.08*  --  5.73* 4.00*  CALCIUM 7.5* 8.1* 8.3*  --  8.3* 8.1*  PHOS 6.2*  --  7.1*  --  6.9* 4.4   Liver Function Tests:  Recent Labs Lab 05/18/15 1835 05/20/15 0718 05/22/15 0700 05/24/15 1400  ALBUMIN 3.0* 3.1* 3.0* 2.8*   CBC:  Recent Labs Lab 05/21/15 0534 05/21/15 0940 05/22/15 0425 05/22/15 0700 05/24/15 1400 05/25/15 0529  WBC 22.8*  --  19.0* 19.9* 14.8* 12.6*  HGB 9.8* 9.9* 8.7* 8.6* 8.7* 8.6*  HCT 28.8* 29.0* 26.7* 26.2* 26.5* 28.0*  MCV 88.3  --  90.8 90.7 94.0 97.2  PLT 223  --  202 199 153 173   CBG:  Recent Labs Lab 05/24/15 0759 05/24/15 1231 05/24/15 1837 05/24/15 2133 05/25/15 0830  GLUCAP 127* 133* 92 134* 114*    Recent Results (from the past 240 hour(s))  Surgical pcr screen     Status: Abnormal   Collection Time: 05/21/15  5:09 AM  Result Value Ref Range Status   MRSA, PCR POSITIVE (A) NEGATIVE Final   Staphylococcus aureus POSITIVE (A) NEGATIVE Final    Comment:        The Xpert SA Assay (FDA approved for NASAL specimens in patients over 69 years of age), is one component of a comprehensive surveillance program.  Test performance has been validated by [facility] for patients greater than or equal to 80 year old. It is not intended to diagnose infection nor to guide or  monitor treatment.      Scheduled Meds: . atorvastatin  20 mg Oral q1800  . budesonide-formoterol  2 puff Inhalation BID  . calcitRIOL  0.25 mcg Oral Daily  . darbepoetin (ARANESP) injection - DIALYSIS  200 mcg Intravenous Q Thu-HD  . enoxaparin (LOVENOX) injection  90 mg Subcutaneous Q24H  . insulin aspart  0-15 Units Subcutaneous TID WC  . insulin aspart  0-5 Units Subcutaneous QHS  . ipratropium-albuterol  3 mL Nebulization TID  . metoprolol  25 mg Oral BID  . sodium chloride  500 mL Intravenous Once  . sodium chloride  3 mL Intravenous Q12H  . tiotropium  18 mcg Inhalation Daily  . Warfarin - Pharmacist Dosing Inpatient   Does not apply q1800   Continuous Infusions: . sodium chloride 10 mL/hr at 05/21/15 0946     Reyne Dumas, MD  [facility] Pager [phone]  If 7PM-7AM, please contact night-coverage [email or web address] Password [email or web address] 05/25/2015, 9:10 AM   LOS: 11 days

## 2015-05-25 NOTE — Progress Notes (Signed)
Spoke with patient at length about concerns regarding his high fall risk status, and his refusal of the bed alarm.  Patient still insistent that he does not need the chair or bed alarm, and that he is not a high fall risk.  Patient is alert and oriented times four.  Reeducated patient on safety measures to prevent falls.  Patient states he understands.  Will continue to monitor patient.

## 2015-05-25 NOTE — Progress Notes (Signed)
ANTICOAGULATION CONSULT NOTE - Follow Up Consult  Pharmacy Consult for Lovenox and warfarin Indication: atrial fibrillation  Allergies  Allergen Reactions  . Yellow Dyes (Non-Tartrazine) Other (See Comments)    Burns arms  . Levaquin [Levofloxacin In D5w] Itching    Patient Measurements: Height: 5\' 8"  (172.7 cm) Weight: 197 lb 8.5 oz (89.6 kg) IBW/kg (Calculated) : 68.4  Vital Signs: Temp: 99 F (37.2 C) (12/03 0714) Temp Source: Oral (12/03 0714) BP: 110/41 mmHg (12/03 0955) Pulse Rate: 95 (12/03 0955)  Labs:  Recent Labs  05/23/15 0513 05/24/15 0632 05/24/15 1400 05/25/15 0529  HGB  --   --  8.7* 8.6*  HCT  --   --  26.5* 28.0*  PLT  --   --  153 173  LABPROT 15.0 15.0  --  16.9*  INR 1.17 1.17  --  1.37  CREATININE  --   --  4.00*  --     Estimated Creatinine Clearance: 16.6 mL/min (by C-G formula based on Cr of 4).   Assessment: 80 y/o male with CKD V who was on apixiban PTA for Afib who has now transitioned to ESRD and is HD dependent.  AC/heme: Lovenox for hx afib (PTA apixaban - changed d/t renal dysfxn, renal MD wanted lovenox) -  INR 1.37 , Hgb 8.6, plts 173, no bleeding noted. Initiate Coumadin 12/1. CHADS-VASc = 6  Goal of Therapy:  INR 2-3 Anti-Xa level 0.6-1 units/ml 4hrs after LMWH dose given Monitor platelets by anticoagulation protocol: Yes   Plan:  - Continue Lovenox 90 mg SQ q24h  - will adjust as weight changes - Warfarin 7.5 mg PO tonight - Daily INR, CBC q72h while on Lovenox - Monitor for s/sx of bleeding  Sydney Azure S. Alford Highland, PharmD, [facility] Clinical Staff Pharmacist Pager [phone]  05/25/2015 11:03 AM

## 2015-05-26 ENCOUNTER — Inpatient Hospital Stay (HOSPITAL_COMMUNITY): Payer: Medicare PPO

## 2015-05-26 LAB — GLUCOSE, CAPILLARY
GLUCOSE-CAPILLARY: 111 mg/dL — AB (ref 65–99)
GLUCOSE-CAPILLARY: 108 mg/dL — AB (ref 65–99)
Glucose-Capillary: 147 mg/dL — ABNORMAL HIGH (ref 65–99)
Glucose-Capillary: 106 mg/dL — ABNORMAL HIGH (ref 65–99)

## 2015-05-26 LAB — PROTIME-INR
INR: 1.44 (ref 0.00–1.49)
Prothrombin Time: 17.6 seconds — ABNORMAL HIGH (ref 11.6–15.2)

## 2015-05-26 MED ORDER — ALBUTEROL SULFATE (2.5 MG/3ML) 0.083% IN NEBU: 2.5000 mg | INHALATION_SOLUTION | RESPIRATORY_TRACT | Status: DC | PRN

## 2015-05-26 MED ORDER — PIPERACILLIN-TAZOBACTAM IN DEX 2-0.25 GM/50ML IV SOLN
2.2500 g | Freq: Three times a day (TID) | INTRAVENOUS | Status: DC
  Administered 2015-05-26 – 2015-06-04 (×25): 2.25 g via INTRAVENOUS
  Filled 2015-05-26 (×32): qty 50

## 2015-05-26 MED ORDER — VANCOMYCIN HCL IN DEXTROSE 1-5 GM/200ML-% IV SOLN
1000.0000 mg | INTRAVENOUS | Status: DC
  Administered 2015-05-27: 1000 mg via INTRAVENOUS
  Filled 2015-05-26 (×2): qty 200

## 2015-05-26 MED ORDER — DEXTROMETHORPHAN POLISTIREX ER 30 MG/5ML PO SUER
30.0000 mg | Freq: Two times a day (BID) | ORAL | Status: DC | PRN
  Administered 2015-05-28: 30 mg via ORAL
  Filled 2015-05-26 (×2): qty 10

## 2015-05-26 MED ORDER — MIDODRINE HCL 5 MG PO TABS
10.0000 mg | ORAL_TABLET | ORAL | Status: DC
  Administered 2015-05-27: 10 mg via ORAL
  Filled 2015-05-26: qty 2

## 2015-05-26 MED ORDER — BENZONATATE 100 MG PO CAPS
100.0000 mg | ORAL_CAPSULE | Freq: Three times a day (TID) | ORAL | Status: DC
  Administered 2015-05-26 – 2015-05-27 (×4): 100 mg via ORAL
  Filled 2015-05-26 (×4): qty 1

## 2015-05-26 MED ORDER — GUAIFENESIN-DM 100-10 MG/5ML PO SYRP
5.0000 mL | ORAL_SOLUTION | ORAL | Status: DC | PRN
  Administered 2015-05-26 – 2015-05-27 (×2): 5 mL via ORAL
  Filled 2015-05-26 (×3): qty 5

## 2015-05-26 MED ORDER — WARFARIN SODIUM 5 MG PO TABS
10.0000 mg | ORAL_TABLET | Freq: Once | ORAL | Status: AC
  Administered 2015-05-26: 10 mg via ORAL
  Filled 2015-05-26: qty 2

## 2015-05-26 MED ORDER — ALBUTEROL SULFATE (2.5 MG/3ML) 0.083% IN NEBU: 2.5000 mg | INHALATION_SOLUTION | Freq: Three times a day (TID) | RESPIRATORY_TRACT | Status: DC

## 2015-05-26 MED ORDER — VANCOMYCIN HCL 10 G IV SOLR
2000.0000 mg | INTRAVENOUS | Status: AC
  Administered 2015-05-26: 2000 mg via INTRAVENOUS
  Filled 2015-05-26: qty 2000

## 2015-05-26 MED ORDER — DEXTROMETHORPHAN POLISTIREX ER 30 MG/5ML PO SUER
30.0000 mg | ORAL | Status: AC
  Administered 2015-05-26: 30 mg via ORAL
  Filled 2015-05-26: qty 5

## 2015-05-26 NOTE — Progress Notes (Signed)
ANTIBIOTIC CONSULT NOTE - INITIAL  Pharmacy Consult for Vanco/Zosyn Indication: pneumonia  Allergies  Allergen Reactions  . Yellow Dyes (Non-Tartrazine) Other (See Comments)    Burns arms  . Levaquin [Levofloxacin In D5w] Itching    Patient Measurements: Height: 5\' 8"  (172.7 cm) Weight: 189 lb 13.1 oz (86.1 kg) IBW/kg (Calculated) : 68.4 Adjusted Body Weight:   Vital Signs: Temp: 98.6 F (37 C) (12/04 0548) Temp Source: Oral (12/04 0548) BP: 116/27 mmHg (12/04 0936) Pulse Rate: 91 (12/04 0936) Intake/Output from previous day: 12/03 0701 - 12/04 0700 In: 360 [P.O.:360] Out: 450  Intake/Output from this shift: Total I/O In: 120 [P.O.:120] Out: -   Labs:  Recent Labs  05/24/15 1400 05/25/15 0529 05/25/15 1424  WBC 14.8* 12.6*  --   HGB 8.7* 8.6*  --   PLT 153 173  --   CREATININE 4.00*  --  4.51*   Estimated Creatinine Clearance: 14.4 mL/min (by C-G formula based on Cr of 4.51). No results for input(s): VANCOTROUGH, VANCOPEAK, VANCORANDOM, GENTTROUGH, GENTPEAK, GENTRANDOM, TOBRATROUGH, TOBRAPEAK, TOBRARND, AMIKACINPEAK, AMIKACINTROU, AMIKACIN in the last 72 hours.   Microbiology: Recent Results (from the past 720 hour(s))  MRSA PCR Screening     Status: Abnormal   Collection Time: 05/14/15  7:18 AM  Result Value Ref Range Status   MRSA by PCR POSITIVE (A) NEGATIVE Final    Comment:        The GeneXpert MRSA Assay (FDA approved for NASAL specimens only), is one component of a comprehensive MRSA colonization surveillance program. It is not intended to diagnose MRSA infection nor to guide or monitor treatment for MRSA infections. RESULT CALLED TO, READ BACK BY AND VERIFIED WITH: H. MILLS RN 9:55 05/14/15 ([email or web address])   Surgical pcr screen     Status: Abnormal   Collection Time: 05/21/15  5:09 AM  Result Value Ref Range Status   MRSA, PCR POSITIVE (A) NEGATIVE Final   Staphylococcus aureus POSITIVE (A) NEGATIVE Final    Comment:        The Xpert SA Assay  (FDA approved for NASAL specimens in patients over 36 years of age), is one component of a comprehensive surveillance program.  Test performance has been validated by [facility] for patients greater than or equal to 71 year old. It is not intended to diagnose infection nor to guide or monitor treatment.     Medical History: Past Medical History  Diagnosis Date  . Stroke ([place]) 2012  . High cholesterol   . Hypertension   . Kidney disease     CKD4, sees [place] [facility], considering dialysis  . COPD (chronic obstructive pulmonary disease) ([place])   . CAD (coronary artery disease) [place], [name]  . Acute and chronic respiratory failure 10/22/2012  . Osteoarthritis 10/22/2012  . Pneumonia April 2014  . Atrial fibrillation [facility])     Assessment: 80 yom admitted 05/13/2015 for cough, dyspnea  ID: Doxy completed 5days - Tm 99, WBC 12.6 down (had been on steroids), no cultures. Add Vanco/Zosyn for PNA. Doxy: 11/23>>11/27 Vanco 12/4>> Zosyn 12/4>>  11/22 MRSA PCR+  Goal of Therapy:  Vanco level <20 prior to re-dosing post-HD  Plan:  - Vanco 2g Iv x 1 now then 1g after each HD (have scheduled MWF, but watch for additional HD and length of session) - Zosyn 2.25g IV q8hr.   Aariya Ferrick S. Alford Highland, PharmD, BCPS Clinical Staff Pharmacist Pager [phone]  Eilene Ghazi Stillinger 05/26/2015,2:35 PM

## 2015-05-26 NOTE — Progress Notes (Signed)
ANTICOAGULATION CONSULT NOTE - Follow Up Consult  Pharmacy Consult for Lovenox and warfarin Indication: atrial fibrillation  Allergies  Allergen Reactions  . Yellow Dyes (Non-Tartrazine) Other (See Comments)    Burns arms  . Levaquin [Levofloxacin In D5w] Itching    Patient Measurements: Height: 5\' 8"  (172.7 cm) Weight: 189 lb 13.1 oz (86.1 kg) IBW/kg (Calculated) : 68.4  Vital Signs: Temp: 98.6 F (37 C) (12/04 0548) Temp Source: Oral (12/04 0548) BP: 99/51 mmHg (12/04 0548) Pulse Rate: 109 (12/04 0548)  Labs:  Recent Labs  05/24/15 0632 05/24/15 1400 05/25/15 0529 05/25/15 1424 05/26/15 0540  HGB  --  8.7* 8.6*  --   --   HCT  --  26.5* 28.0*  --   --   PLT  --  153 173  --   --   LABPROT 15.0  --  16.9*  --  17.6*  INR 1.17  --  1.37  --  1.44  CREATININE  --  4.00*  --  4.51*  --     Estimated Creatinine Clearance: 14.4 mL/min (by C-G formula based on Cr of 4.51).   Assessment: 80 y/o male with CKD V who was on apixiban PTA for Afib who has now transitioned to ESRD and is HD dependent.  AC/heme: Lovenox for hx afib (PTA apixaban - changed d/t renal dysfxn, renal MD wanted lovenox) -  INR 1.44 , Hgb 8.6, plts 173, no bleeding noted. Initiated Coumadin 12/1. High fall risk. CHADS-VASc = 6  Goal of Therapy:  INR 2-3 Anti-Xa level 0.6-1 units/ml 4hrs after LMWH dose given Monitor platelets by anticoagulation protocol: Yes   Plan:  - Continue Lovenox 90 mg SQ q24h  - will adjust as weight changes - Warfarin 10 mg PO tonight increased - Daily INR, CBC q72h while on Lovenox - Monitor for s/sx of bleeding  Keian Odriscoll S. Alford Highland, PharmD, [facility] Clinical Staff Pharmacist Pager [phone]  05/26/2015 9:33 AM

## 2015-05-26 NOTE — Progress Notes (Signed)
[facility]                                                                              Patient Demographics  Terry [name], is a 80 y.o. male, DOB - 1934-07-19, [id number]  Admit date - 05/13/2015   Admitting Physician Terry Dada, MD  Outpatient Primary MD for the patient is Terry Kern., DO  LOS - 12   Chief Complaint  Patient presents with  . Cough  . Shortness of Breath       Brief HPI   80 year old male with a history of COPD, hypertension, CAD/CABG, CKD stage IV, atrial fibrillation diagnosed October 2015 on apixaban presented with one-week history of worsening cough and shortness of breath. The patient was seen by his primary care provider 4 days prior to admission and prescribed Augmentin and prednisone without improvement. He presented to the emergency department where he was noted to have oxygen saturation of 80% on room air and found to be in respiratory distress. The patient was started on doxycycline and Solu-Medrol. The patient has also been describing symptoms of orthopnea and PND with increasing abdominal girth and lower extremity edema for the past week prior to admission.  After admission, the patient was started on intravenous steroids. His breathing did not improve. He subsequently started on intravenous furosemide for volume excess. Ultimately, the patient was declared ESRD and dialysis catheter was placed and dialysis initiated on 05/17/2015.   Assessment & Plan    Principal Problem: Acute respiratory failure with hypoxia -Secondary to CHF and COPD exacerbation, volume overload, O2 sats 95% on 3 L nasal cannula,  -continue Pulmonary hygiene and treatment for COPD/HD for volume control  - Repeat chest x-ray, shows pleural effusion with compressive right-sided atelectasis, however cough is worsening per patient with phlegm, placed on IV Zosyn and Vanc (history of +MRSA PCR). Per patient's wife, had a history of severe pneumonia  in the past. -Added antitussives   Active problems Acute diastolic CHF  -remains clinically volume overloaded, furosemide discontinued as patient is started on hemodialysis.  -2-D echo on 05/16/15 showed EF 55-60%, trivial MR/TR - Weight 208 pounds on 11/22> 189 pounds on 12/4 -Volume now manage with HD -advised to follow low sodium diet    Acute on chronic renal failure (CKD 4-5), Now new on ESRD started on HD -Previous Baseline creatinine 3.2-3.4. Nephrology and vascular surgery was consulted. Patient underwent right AV fistula placement on 05/21/15. -PermCath was placed by radiology on 05/17/15 -Patient started on hemodialysis, apparently needs Klonopin 1 mg prior to HD for anxiety and cramping. Could not complete treatment on 12/3 due to hypotension.  - Robaxin for cramping with HD  - CLIPPED with [name] HD outpatient unit.  COPD exacerbation  -Patient has >60 pk years, completed steroid tapering, continue bronchodilators  Atrial fibrillation  -CHADS-VASc = 6 -Discontinued apixaban due to worsening renal function/new ESRD -continue lovenox and coumadin bridge-INR 1.44 -Continue metoprolol tartrate for rate control   Hypertension -Continue metoprolol tartrate at adjusted dose -advise to follow low sodium diet -HD helping with volume and BP control  Diabetes mellitus type 2 -Checked hemoglobin  A1c--6.6 -pt agreeable to start po meds upon d/c (low dose glipizide or glyburide) -NovoLog sliding scale while inpatient -advise to follow low carb diet   Coronary artery disease -Cardiac cath 11/30/1997. Mild distal Left main stenosis, 25% proximal LAD, minor irregularities Circumflex, 95% mid RCA stenosis treated with 3.5 x 32 mm bare metal stent -no angina presently   Hyponatremia -Secondary to worsening renal function and fluid overload -improving with HD  Code Status: Full code  Family Communication: Discussed in detail with the patient, all imaging  results, lab results explained to the patient and wife at bedside   Disposition Plan:   Time Spent in minutes   25 minutes  Procedures  HD  Consults   renal  DVT Prophylaxis  Lovenox  Medications  Scheduled Meds: . atorvastatin  20 mg Oral q1800  . budesonide-formoterol  2 puff Inhalation BID  . calcitRIOL  0.25 mcg Oral Daily  . darbepoetin (ARANESP) injection - DIALYSIS  200 mcg Intravenous Q Thu-HD  . enoxaparin (LOVENOX) injection  90 mg Subcutaneous Q24H  . insulin aspart  0-15 Units Subcutaneous TID WC  . insulin aspart  0-5 Units Subcutaneous QHS  . ipratropium-albuterol  3 mL Nebulization TID  . magic mouthwash  5 mL Oral QID  . metoprolol  25 mg Oral BID  . sodium chloride  500 mL Intravenous Once  . sodium chloride  3 mL Intravenous Q12H  . tiotropium  18 mcg Inhalation Daily  . warfarin  10 mg Oral ONCE-1800  . Warfarin - Pharmacist Dosing Inpatient   Does not apply q1800   Continuous Infusions: . sodium chloride 10 mL/hr at 05/21/15 0946   PRN Meds:.albuterol, clonazePAM, guaiFENesin-dextromethorphan, HYDROcodone-acetaminophen, methocarbamol, morphine injection, ondansetron **OR** ondansetron (ZOFRAN) IV, oxyCODONE   Antibiotics   Anti-infectives    Start     Dose/Rate Route Frequency Ordered Stop   05/20/15 0600  ceFAZolin (ANCEF) IVPB 1 g/50 mL premix    Comments:  Send with pt to OR   1 g 100 mL/hr over 30 Minutes Intravenous To [facility] 05/20/15 1011 05/21/15 0600   05/17/15 1530  [MAR Hold]  ceFAZolin (ANCEF) IVPB 2 g/50 mL premix     (MAR Hold since 05/21/15 0912)   2 g 100 mL/hr over 30 Minutes Intravenous  Once 05/17/15 1518 05/21/15 1059   05/17/15 1448  ceFAZolin (ANCEF) 2-3 GM-% IVPB SOLR    Comments:  Desiree Hane   : cabinet override      05/17/15 1448 05/17/15 1519   05/14/15 1000  doxycycline (VIBRA-TABS) tablet 100 mg     100 mg Oral Every 12 hours 05/14/15 0116 05/19/15 2218   05/13/15 2145  azithromycin (ZITHROMAX)  500 mg in dextrose 5 % 250 mL IVPB     500 mg 250 mL/hr over 60 Minutes Intravenous  Once 05/13/15 2137 05/13/15 2258        Subjective:   Terry [name] was seen and examined today. Complaining of coughing with phlegm, no fevers or chills. States had a rough night.  Patient denies dizziness, abdominal pain, N/V/D/C, new weakness, numbess, tingling. No acute events overnight.    Objective:   Blood pressure 116/27, pulse 91, temperature 98.6 F (37 C), temperature source Oral, resp. rate 18, height 5\' 8"  (1.727 m), weight 86.1 kg (189 lb 13.1 oz), SpO2 95 %.  Wt Readings from Last 3 Encounters:  05/26/15 86.1 kg (189 lb 13.1 oz)  05/01/15 93.895 kg (207 lb)  03/20/15 94.348 kg (208 lb)  Intake/Output Summary (Last 24 hours) at 05/26/15 1353 Last data filed at 05/26/15 0901  Gross per 24 hour  Intake    280 ml  Output    450 ml  Net   -170 ml    Exam  General: Alert and oriented x 3, NAD  HEENT:  PERRLA, EOMI, Anicteric Sclera, mucous membranes moist.   Neck: Supple, no JVD, no masses  CVS: S1 S2 auscultated, no rubs, murmurs or gallops. Regular rate and rhythm.  Respiratory:Decreased breath sounds at the bases otherwise no rhonchi   Abdomen: Soft, nontender, nondistended, + bowel sounds  Ext: no cyanosis clubbing, 3+ pitting edema  Neuro: AAOx3, Cr N's II- XII. Strength 5/5 upper and lower extremities bilaterally  Skin: No rashes  Psych: Normal affect and demeanor, alert and oriented x3    Data Review   Micro Results Recent Results (from the past 240 hour(s))  Surgical pcr screen     Status: Abnormal   Collection Time: 05/21/15  5:09 AM  Result Value Ref Range Status   MRSA, PCR POSITIVE (A) NEGATIVE Final   Staphylococcus aureus POSITIVE (A) NEGATIVE Final    Comment:        The Xpert SA Assay (FDA approved for NASAL specimens in patients over 39 years of age), is one component of a comprehensive surveillance program.  Test performance has been  validated by [facility] for patients greater than or equal to 67 year old. It is not intended to diagnose infection nor to guide or monitor treatment.     Radiology Reports Dg Chest 2 View  05/26/2015  CLINICAL DATA:  History of pneumonia.  Shortness of breath. EXAM: CHEST  2 VIEW COMPARISON:  05/24/2015 FINDINGS: Large bore right internal jugular approach central venous catheter is stable. Cardiomediastinal silhouette is normal. Mediastinal contours appear intact. There is no evidence of pneumothorax. There is a right pleural effusion with linear airspace opacity in the right lower lobe likely representing atelectasis. The left costophrenic angle is incompletely visualized, and therefore small left pleural effusion cannot be excluded. Osseous structures are without acute abnormality. Soft tissues are grossly normal. IMPRESSION: Right pleural effusion with probably compressive atelectasis in the right lower lobe. Left costophrenic angle incompletely visualized, and therefore small left pleural effusion cannot be excluded. Electronically Signed   By: Fidela Salisbury M.D.   On: 05/26/2015 11:09   Dg Chest 2 View  05/13/2015  CLINICAL DATA:  80 year old with 1 week history of productive cough and severe shortness of breath. Former smoker with current history of COPD. EXAM: CHEST  2 VIEW COMPARISON:  05/28/2014 and earlier. FINDINGS: AP erect and lateral imaging was performed. Cardiac silhouette mildly enlarged with a right coronary artery stent. Thoracic aorta atherosclerotic, unchanged. Prominent central pulmonary arteries, unchanged. Scarring in the lower lobes, right greater than left, unchanged. Hyperinflation, emphysematous changes in the upper lobes, and mild central peribronchial thickening, unchanged. No new pulmonary parenchymal abnormalities. Degenerative disc disease and spondylosis involving the thoracic spine. IMPRESSION: COPD/emphysema. Scarring in the lower lobes, right greater than  left. No acute cardiopulmonary disease. Stable examination. Electronically Signed   By: Evangeline Dakin M.D.   On: 05/13/2015 21:03   US Renal  05/15/2015  CLINICAL DATA:  Acute renal injury EXAM: RENAL / URINARY TRACT ULTRASOUND COMPLETE COMPARISON:  10/24/2014 FINDINGS: Right Kidney: Length: 14.8 cm. Multiple cysts are noted. The largest of these measures 2.2 cm in greatest dimension. Left Kidney: Length: 14.0 cm. Multiple cysts are noted. The largest of these measures  3.1 cm in greatest dimension. Bladder: Appears normal for degree of bladder distention. IMPRESSION: Bilateral renal cysts similar to that seen on prior MRI examination. No acute obstructive changes are noted. Electronically Signed   By: Inez Catalina M.D.   On: 05/15/2015 16:29   Ir Fluoro Guide Cv Line Right  05/17/2015  INDICATION: Chronic kidney disease with early uremia. Need to start hemodialysis. EXAM: FLUOROSCOPIC AND ULTRASOUND GUIDED PLACEMENT OF A TUNNELED DIALYSIS CATHETER Physician: Stephan Minister. Henn, MD FLUOROSCOPY TIME:  42 seconds, 13.2 mGy MEDICATIONS: 2 g Ancef. As antibiotic prophylaxis, Ancef was ordered pre-procedure and administered intravenously within one hour of incision. 0.5 mg versed, 25 mcg fentanyl. A radiology nurse monitored the patient for moderate sedation. ANESTHESIA/SEDATION: Moderate sedation time: 30 minutes PROCEDURE: Informed consent was obtained for placement of a tunneled dialysis catheter. The patient was placed supine on the interventional table. Ultrasound confirmed a patent right internal jugularvein. Ultrasound images were obtained for documentation. The right side of the neck was prepped and draped in a sterile fashion. The right side of the neck was anesthetized with 1% lidocaine. Maximal barrier sterile technique was utilized including caps, mask, sterile gowns, sterile gloves, sterile drape, hand hygiene and skin antiseptic. A small incision was made with #11 blade scalpel. A 21 gauge needle  directed into the right internal jugular vein with ultrasound guidance. A micropuncture dilator set was placed. A 23 cm tip to cuff Palindrome catheter was selected. The skin below the right clavicle was anesthetized and a small incision was made with an #11 blade scalpel. A subcutaneous tunnel was formed to the vein dermatotomy site. The catheter was brought through the tunnel. The vein dermatotomy site was dilated to accommodate a peel-away sheath. The catheter was placed through the peel-away sheath and directed into the central venous structures. The tip of the catheter was placed at the superior cavoatrial junction with fluoroscopy. Fluoroscopic images were obtained for documentation. Both lumens were found to aspirate and flush well. The proper amount of heparin was flushed in both lumens. The vein dermatotomy site was closed using a single layer of absorbable suture and Dermabond. The catheter was secured to the skin using Prolene suture. FINDINGS: Catheter tip at the superior cavoatrial junction. Estimated blood loss: Minimal COMPLICATIONS: None IMPRESSION: Successful placement of a right jugular tunneled dialysis catheter using ultrasound and fluoroscopic guidance. Electronically Signed   By: Markus Daft M.D.   On: 05/17/2015 16:23   Ir US Guide Vasc Access Right  05/17/2015  INDICATION: Chronic kidney disease with early uremia. Need to start hemodialysis. EXAM: FLUOROSCOPIC AND ULTRASOUND GUIDED PLACEMENT OF A TUNNELED DIALYSIS CATHETER Physician: Stephan Minister. Henn, MD FLUOROSCOPY TIME:  42 seconds, 13.2 mGy MEDICATIONS: 2 g Ancef. As antibiotic prophylaxis, Ancef was ordered pre-procedure and administered intravenously within one hour of incision. 0.5 mg versed, 25 mcg fentanyl. A radiology nurse monitored the patient for moderate sedation. ANESTHESIA/SEDATION: Moderate sedation time: 30 minutes PROCEDURE: Informed consent was obtained for placement of a tunneled dialysis catheter. The patient was placed  supine on the interventional table. Ultrasound confirmed a patent right internal jugularvein. Ultrasound images were obtained for documentation. The right side of the neck was prepped and draped in a sterile fashion. The right side of the neck was anesthetized with 1% lidocaine. Maximal barrier sterile technique was utilized including caps, mask, sterile gowns, sterile gloves, sterile drape, hand hygiene and skin antiseptic. A small incision was made with #11 blade scalpel. A 21 gauge needle directed into the right  internal jugular vein with ultrasound guidance. A micropuncture dilator set was placed. A 23 cm tip to cuff Palindrome catheter was selected. The skin below the right clavicle was anesthetized and a small incision was made with an #11 blade scalpel. A subcutaneous tunnel was formed to the vein dermatotomy site. The catheter was brought through the tunnel. The vein dermatotomy site was dilated to accommodate a peel-away sheath. The catheter was placed through the peel-away sheath and directed into the central venous structures. The tip of the catheter was placed at the superior cavoatrial junction with fluoroscopy. Fluoroscopic images were obtained for documentation. Both lumens were found to aspirate and flush well. The proper amount of heparin was flushed in both lumens. The vein dermatotomy site was closed using a single layer of absorbable suture and Dermabond. The catheter was secured to the skin using Prolene suture. FINDINGS: Catheter tip at the superior cavoatrial junction. Estimated blood loss: Minimal COMPLICATIONS: None IMPRESSION: Successful placement of a right jugular tunneled dialysis catheter using ultrasound and fluoroscopic guidance. Electronically Signed   By: Markus Daft M.D.   On: 05/17/2015 16:23   Dg Chest Port 1 View  05/24/2015  CLINICAL DATA:  Shortness of breath. Finished hemodialysis 1 hour previously. EXAM: PORTABLE CHEST 1 VIEW COMPARISON:  05/13/2015. FINDINGS: Normal sized  heart. Clear lungs. Interval right jugular catheter with its tip in the inferior aspect of the superior vena cava. No pneumothorax. Mild right basilar atelectasis and patchy opacity with mild progression. Clear left lung. Mild lower thoracic spine degenerative changes. IMPRESSION: Mild right basilar atelectasis and possible pneumonia. Electronically Signed   By: Claudie Revering M.D.   On: 05/24/2015 20:50    CBC  Recent Labs Lab 05/21/15 0534 05/21/15 0940 05/22/15 0425 05/22/15 0700 05/24/15 1400 05/25/15 0529  WBC 22.8*  --  19.0* 19.9* 14.8* 12.6*  HGB 9.8* 9.9* 8.7* 8.6* 8.7* 8.6*  HCT 28.8* 29.0* 26.7* 26.2* 26.5* 28.0*  PLT 223  --  202 199 153 173  MCV 88.3  --  90.8 90.7 94.0 97.2  MCH 30.1  --  29.6 29.8 30.9 29.9  MCHC 34.0  --  32.6 32.8 32.8 30.7  RDW 13.9  --  14.4 14.4 15.5 15.9*    Chemistries   Recent Labs Lab 05/20/15 0718 05/21/15 0940 05/22/15 0700 05/24/15 1400 05/25/15 1424  NA 131* 131* 132* 133* 132*  K 3.1* 3.7 4.3 3.6 4.1  CL 93*  --  95* 96* 97*  CO2 26  --  25 26 26   GLUCOSE 103* 121* 137* 154* 101*  BUN 89*  --  81* 35* 34*  CREATININE 5.08*  --  5.73* 4.00* 4.51*  CALCIUM 8.3*  --  8.3* 8.1* 8.0*   ------------------------------------------------------------------------------------------------------------------ estimated creatinine clearance is 14.4 mL/min (by C-G formula based on Cr of 4.51). ------------------------------------------------------------------------------------------------------------------ No results for input(s): HGBA1C in the last 72 hours. ------------------------------------------------------------------------------------------------------------------ No results for input(s): CHOL, HDL, LDLCALC, TRIG, CHOLHDL, LDLDIRECT in the last 72 hours. ------------------------------------------------------------------------------------------------------------------ No results for input(s): TSH, T4TOTAL, T3FREE, THYROIDAB in the last  72 hours.  Invalid input(s): FREET3 ------------------------------------------------------------------------------------------------------------------ No results for input(s): VITAMINB12, FOLATE, FERRITIN, TIBC, IRON, RETICCTPCT in the last 72 hours.  Coagulation profile  Recent Labs Lab 05/22/15 0425 05/23/15 0513 05/24/15 0632 05/25/15 0529 05/26/15 0540  INR 1.18 1.17 1.17 1.37 1.44    No results for input(s): DDIMER in the last 72 hours.  Cardiac Enzymes No results for input(s): CKMB, TROPONINI, MYOGLOBIN in the last 168 hours.  Invalid  input(s): CK ------------------------------------------------------------------------------------------------------------------ Invalid input(s): POCBNP   Recent Labs  05/25/15 0830 05/25/15 1158 05/25/15 1852 05/25/15 2142 05/26/15 0841 05/26/15 1157  GLUCAP 114* 180* 95 114* 111* 108*     RAI,RIPUDEEP M.D. [facility] 05/26/2015, 1:53 PM  Pager: [phone] Between 7am to 7pm - call Pager - [phone]  After 7pm go to [email or web address] - password [email or web address]  Call night coverage person covering after 7pm

## 2015-05-26 NOTE — Progress Notes (Signed)
Assessment/Plan: 1 New start ESRD, dialysis dependent. S/p AVF RUA Dr. Scot Dock 2 Anemia on esa/fe 3 HPTH on vit D 4 COPD longstanding 5 CAD 6 DJD 7 CHF/Vol overload - still LE edema 3+ but CXR no edema 8 Oral thrush 9 Cough - basilar CXR changes, consider PNA  10 Chronic afib - on MTP, loven/ coum 11 Hypotension - w attempts to draw fluid  P - have d/w primary , prob start abx for HCAP.  HD Monday, continue to lower vol as BP tolerates. Can go to HD 3-4 times per week now, pt is struggling w daily HD and has only peripheral edema. May need to ask cardiology to see for [facility] Rx as low BP's problematic w vol excess and on MTP for rate control. Will add midodrine pre-HD 10 mg tiw.   Kelly Splinter MD [employer] pager [phone]    cell [phone] 05/26/2015, 2:12 PM   Subjective: Interval History: should  Objective: Vital signs in last 24 hours: Temp:  [97.7 F (36.5 C)-98.6 F (37 C)] 98.6 F (37 C) (12/04 0548) Pulse Rate:  [82-117] 91 (12/04 0936) Resp:  [18-50] 18 (12/04 0548) BP: (84-169)/(27-55) 116/27 mmHg (12/04 0936) SpO2:  [94 %-98 %] 95 % (12/04 0755) Weight:  [86.1 kg (189 lb 13.1 oz)-87.6 kg (193 lb 2 oz)] 86.1 kg (189 lb 13.1 oz) (12/04 0500) Weight change:   Intake/Output from previous day: 12/03 0701 - 12/04 0700 In: 360 [P.O.:360] Out: 450  Intake/Output this shift: Total I/O In: 120 [P.O.:120] Out: -   General appearance: alert and cooperative Chest wall: no tenderness, tr presacral Extremities: edema 2-3+ pitting  Lab Results:  Recent Labs  05/24/15 1400 05/25/15 0529  WBC 14.8* 12.6*  HGB 8.7* 8.6*  HCT 26.5* 28.0*  PLT 153 173   BMET:   Recent Labs  05/24/15 1400 05/25/15 1424  NA 133* 132*  K 3.6 4.1  CL 96* 97*  CO2 26 26  GLUCOSE 154* 101*  BUN 35* 34*  CREATININE 4.00* 4.51*  CALCIUM 8.1* 8.0*   No results for input(s): PTH in the last 72 hours. Iron Studies: No results for input(s): IRON, TIBC,  TRANSFERRIN, FERRITIN in the last 72 hours. Studies/Results: Dg Chest 2 View  05/26/2015  CLINICAL DATA:  History of pneumonia.  Shortness of breath. EXAM: CHEST  2 VIEW COMPARISON:  05/24/2015 FINDINGS: Large bore right internal jugular approach central venous catheter is stable. Cardiomediastinal silhouette is normal. Mediastinal contours appear intact. There is no evidence of pneumothorax. There is a right pleural effusion with linear airspace opacity in the right lower lobe likely representing atelectasis. The left costophrenic angle is incompletely visualized, and therefore small left pleural effusion cannot be excluded. Osseous structures are without acute abnormality. Soft tissues are grossly normal. IMPRESSION: Right pleural effusion with probably compressive atelectasis in the right lower lobe. Left costophrenic angle incompletely visualized, and therefore small left pleural effusion cannot be excluded. Electronically Signed   By: Fidela Salisbury M.D.   On: 05/26/2015 11:09   Dg Chest Port 1 View  05/24/2015  CLINICAL DATA:  Shortness of breath. Finished hemodialysis 1 hour previously. EXAM: PORTABLE CHEST 1 VIEW COMPARISON:  05/13/2015. FINDINGS: Normal sized heart. Clear lungs. Interval right jugular catheter with its tip in the inferior aspect of the superior vena cava. No pneumothorax. Mild right basilar atelectasis and patchy opacity with mild progression. Clear left lung. Mild lower thoracic spine degenerative changes. IMPRESSION: Mild right basilar atelectasis and possible pneumonia. Electronically  Signed   By: Claudie Revering M.D.   On: 05/24/2015 20:50    Scheduled: . atorvastatin  20 mg Oral q1800  . benzonatate  100 mg Oral TID  . budesonide-formoterol  2 puff Inhalation BID  . calcitRIOL  0.25 mcg Oral Daily  . darbepoetin (ARANESP) injection - DIALYSIS  200 mcg Intravenous Q Thu-HD  . enoxaparin (LOVENOX) injection  90 mg Subcutaneous Q24H  . insulin aspart  0-15 Units  Subcutaneous TID WC  . insulin aspart  0-5 Units Subcutaneous QHS  . ipratropium-albuterol  3 mL Nebulization TID  . magic mouthwash  5 mL Oral QID  . metoprolol  25 mg Oral BID  . sodium chloride  500 mL Intravenous Once  . sodium chloride  3 mL Intravenous Q12H  . tiotropium  18 mcg Inhalation Daily  . warfarin  10 mg Oral ONCE-1800  . Warfarin - Pharmacist Dosing Inpatient   Does not apply [phone]

## 2015-05-26 NOTE — Progress Notes (Signed)
Pt wife complained that her husband is coughing more than usual, i paged the on call md ordered Guaifenesin 10mg  prn i went to his room to give it, his wife said i shouldn't wake him up because he has not cough after she complained to me, his wife said i should hold on to the med and she will call me when husband is awake, will continue to monitor

## 2015-05-27 LAB — CBC
HCT: 25.9 % — ABNORMAL LOW (ref 39.0–52.0)
Hemoglobin: 8.3 g/dL — ABNORMAL LOW (ref 13.0–17.0)
MCH: 30.4 pg (ref 26.0–34.0)
MCHC: 32 g/dL (ref 30.0–36.0)
MCV: 94.9 fL (ref 78.0–100.0)
PLATELETS: 141 10*3/uL — AB (ref 150–400)
RBC: 2.73 MIL/uL — AB (ref 4.22–5.81)
RDW: 16.3 % — AB (ref 11.5–15.5)
WBC: 15.3 10*3/uL — AB (ref 4.0–10.5)

## 2015-05-27 LAB — GLUCOSE, CAPILLARY
GLUCOSE-CAPILLARY: 150 mg/dL — AB (ref 65–99)
Glucose-Capillary: 110 mg/dL — ABNORMAL HIGH (ref 65–99)
Glucose-Capillary: 176 mg/dL — ABNORMAL HIGH (ref 65–99)

## 2015-05-27 LAB — BASIC METABOLIC PANEL
Anion gap: 12 (ref 5–15)
BUN: 41 mg/dL — AB (ref 6–20)
CALCIUM: 8.1 mg/dL — AB (ref 8.9–10.3)
CHLORIDE: 94 mmol/L — AB (ref 101–111)
CO2: 23 mmol/L (ref 22–32)
CREATININE: 5.88 mg/dL — AB (ref 0.61–1.24)
GFR calc non Af Amer: 8 mL/min — ABNORMAL LOW (ref >60)
GFR, EST AFRICAN AMERICAN: 10 mL/min — AB (ref >60)
Glucose, Bld: 190 mg/dL — ABNORMAL HIGH (ref 65–99)
Potassium: 4.5 mmol/L (ref 3.5–5.1)
SODIUM: 129 mmol/L — AB (ref 135–145)

## 2015-05-27 LAB — PROTIME-INR
INR: 2.51 — AB (ref 0.00–1.49)
PROTHROMBIN TIME: 26.7 s — AB (ref 11.6–15.2)

## 2015-05-27 MED ORDER — HEPARIN SODIUM (PORCINE) 1000 UNIT/ML DIALYSIS: 2000.0000 [IU] | INTRAMUSCULAR | Status: DC | PRN

## 2015-05-27 MED ORDER — PENTAFLUOROPROP-TETRAFLUOROETH EX AERO: 1.0000 "application " | INHALATION_SPRAY | CUTANEOUS | Status: DC | PRN

## 2015-05-27 MED ORDER — LIDOCAINE-PRILOCAINE 2.5-2.5 % EX CREA: 1.0000 "application " | TOPICAL_CREAM | CUTANEOUS | Status: DC | PRN

## 2015-05-27 MED ORDER — ALTEPLASE 2 MG IJ SOLR: 2.0000 mg | Freq: Once | INTRAMUSCULAR | Status: DC | PRN

## 2015-05-27 MED ORDER — SODIUM CHLORIDE 0.9 % IV SOLN: 100.0000 mL | INTRAVENOUS | Status: DC | PRN

## 2015-05-27 MED ORDER — VANCOMYCIN HCL IN DEXTROSE 1-5 GM/200ML-% IV SOLN
1000.0000 mg | INTRAVENOUS | Status: DC
  Administered 2015-05-29: 1000 mg via INTRAVENOUS
  Filled 2015-05-27: qty 200

## 2015-05-27 MED ORDER — LIDOCAINE HCL (PF) 1 % IJ SOLN: 5.0000 mL | INTRAMUSCULAR | Status: DC | PRN

## 2015-05-27 MED ORDER — HEPARIN SODIUM (PORCINE) 1000 UNIT/ML DIALYSIS: 1000.0000 [IU] | INTRAMUSCULAR | Status: DC | PRN

## 2015-05-27 MED ORDER — DOXERCALCIFEROL 4 MCG/2ML IV SOLN
1.0000 ug | INTRAVENOUS | Status: DC
  Administered 2015-05-28 – 2015-06-03 (×3): 1 ug via INTRAVENOUS
  Filled 2015-05-27 (×3): qty 2

## 2015-05-27 MED ORDER — DARBEPOETIN ALFA 200 MCG/0.4ML IJ SOSY
200.0000 ug | PREFILLED_SYRINGE | INTRAMUSCULAR | Status: DC
  Filled 2015-05-27: qty 0.4

## 2015-05-27 NOTE — Progress Notes (Addendum)
[facility] Rounding Note  Subjective: Interval History: Seen in HD Says didn't sleep last night b/c of SOB when he lies down  Objective: Vital signs in last 24 hours: Temp:  [97.9 F (36.6 C)-99.2 F (37.3 C)] 97.9 F (36.6 C) (12/05 0947) Pulse Rate:  [93-110] 93 (12/05 1000) Resp:  [15-20] 16 (12/05 1000) BP: (110-127)/(27-57) 122/57 mmHg (12/05 1000) SpO2:  [91 %-96 %] 96 % (12/05 0947) Weight:  [90.039 kg (198 lb 8 oz)-90.6 kg (199 lb 11.8 oz)] 90.6 kg (199 lb 11.8 oz) (12/05 0947) Weight change: 1.039 kg (2 lb 4.7 oz)  Intake/Output from previous day: 12/04 0701 - 12/05 0700 In: 120 [P.O.:120] Out: 50 [Urine:50] Intake/Output this shift: Total I/O In: 60 [P.O.:60] Out: -   Physical Examination BP 122/57 mmHg  Pulse 93  Temp(Src) 97.9 F (36.6 C) (Oral)  Resp 16  Ht 5\' 8"  (1.727 m)  Wt 90.6 kg (199 lb 11.8 oz)  BMI 30.38 kg/m2  SpO2 96% Pre HD weight: 90.6 kg (max weight this hosp 100.6 kg) Disheveled in appearance Sitting in chair Coarse BS TDC running at 400 dry dressing R side Abdomen obese 3+ pitting edema bilateral LE's R AVF with hematoma at incision site, + bruit, large amount bruising  Lab Results:  Recent Labs  05/25/15 0529 05/27/15 1000  WBC 12.6* 15.3*  HGB 8.6* 8.3*  HCT 28.0* 25.9*  PLT 173 141*   BMET:   Recent Labs  05/24/15 1400 05/25/15 1424  NA 133* 132*  K 3.6 4.1  CL 96* 97*  CO2 26 26  GLUCOSE 154* 101*  BUN 35* 34*  CREATININE 4.00* 4.51*  CALCIUM 8.1* 8.0*     Dg Chest 2 View  05/26/2015  CLINICAL DATA:  History of pneumonia.  Shortness of breath. EXAM: CHEST  2 VIEW COMPARISON:  05/24/2015 FINDINGS: Large bore right internal jugular approach central venous catheter is stable. Cardiomediastinal silhouette is normal. Mediastinal contours appear intact. There is no evidence of pneumothorax. There is a right pleural effusion with linear airspace opacity in the right lower lobe likely representing  atelectasis. The left costophrenic angle is incompletely visualized, and therefore small left pleural effusion cannot be excluded. Osseous structures are without acute abnormality. Soft tissues are grossly normal. IMPRESSION: Right pleural effusion with probably compressive atelectasis in the right lower lobe. Left costophrenic angle incompletely visualized, and therefore small left pleural effusion cannot be excluded. Electronically Signed   By: Fidela Salisbury M.D.   On: 05/26/2015 11:09    Scheduled Medications: . atorvastatin  20 mg Oral q1800  . benzonatate  100 mg Oral TID  . budesonide-formoterol  2 puff Inhalation BID  . calcitRIOL  0.25 mcg Oral Daily  . darbepoetin (ARANESP) injection - DIALYSIS  200 mcg Intravenous Q Thu-HD  . insulin aspart  0-15 Units Subcutaneous TID WC  . insulin aspart  0-5 Units Subcutaneous QHS  . ipratropium-albuterol  3 mL Nebulization TID  . magic mouthwash  5 mL Oral QID  . metoprolol  25 mg Oral BID  . midodrine  10 mg Oral Q M,W,F-HD  . piperacillin-tazobactam (ZOSYN)  IV  2.25 g Intravenous 3 times per day  . sodium chloride  500 mL Intravenous Once  . sodium chloride  3 mL Intravenous Q12H  . tiotropium  18 mcg Inhalation Daily  . vancomycin  1,000 mg Intravenous Q M,W,F-HD  . Warfarin - Pharmacist Dosing Inpatient   Does not apply q1800    Background 80 with  over 29yr HTN, DJD, Severe COPD, CAD, prior ureteral stents, and known advancing late stage 4 CKD. Baseline Cr 3.9. Adm with cough, SOB, edema, worsening renal failure.  Dialysis initiated for worsening renal function/volume overload  ->new ESRD.  Assessment/Plan:  1. New start ESRD, dialysis dependent. 1st HD was 11/26. S/p AVF RUA Dr. Scot Dock 11/29. [facility] 11/25. Requiring HD almost daily for volume overload.  CLIPPED for [facility] MWF 2nd shift but not ready to go home yet.  HD today, again tomorrow for volume.  Having large gains overnight if scales are correct so need to reinforce fluid  restriction. Midodrine for BP. 2. Anemia on Darbe 200 QThurs Last received 12/2. Since MWF will change to QFriday 3. HPTH on vit D - on calcitriol 0.25/day. Last PTH 159 on 11/23. Will get Hectorol at [facility] - change to 1 mcg TIW on MWF.  4. COPD longstanding - bronchodilators 5. Diastolic heart failure 6. AFib -- coumadin. Pharmacy dosing. Metoprolol for rate control. 7. CAD 8. DJD 9. CHF/Vol overload - still LE edema 3+/CXR pleural effusions - dialysis/UF. 10. DM2 11. Compressive atelectasis right lung  - poss PNA - now on vanco and zosyn

## 2015-05-27 NOTE — Progress Notes (Signed)
ANTICOAGULATION CONSULT NOTE - Follow Up Consult  Pharmacy Consult for Lovenox and Warfarin Indication: atrial fibrillation  Allergies  Allergen Reactions  . Yellow Dyes (Non-Tartrazine) Other (See Comments)    Burns arms  . Levaquin [Levofloxacin In D5w] Itching    Patient Measurements: Height: 5\' 8"  (172.7 cm) Weight: 198 lb 8 oz (90.039 kg) IBW/kg (Calculated) : 68.4  Vital Signs: Temp: 98.6 F (37 C) (12/05 0508) Temp Source: Oral (12/05 0508) BP: 127/41 mmHg (12/05 0508) Pulse Rate: 104 (12/05 0813)  Labs:  Recent Labs  05/24/15 1400 05/25/15 0529 05/25/15 1424 05/26/15 0540 05/27/15 0539  HGB 8.7* 8.6*  --   --   --   HCT 26.5* 28.0*  --   --   --   PLT 153 173  --   --   --   LABPROT  --  16.9*  --  17.6* 26.7*  INR  --  1.37  --  1.44 2.51*  CREATININE 4.00*  --  4.51*  --   --     Estimated Creatinine Clearance: 14.7 mL/min (by C-G formula based on Cr of 4.51).   Assessment: 80 y/o male with CKD V who was on apixiban PTA for Afib who has now transitioned to ESRD and is HD dependent.  AC/heme: Lovenox for hx afib (PTA apixaban - changed d/t renal dysfxn, renal MD wanted lovenox) -  INR up to 2.51  Hgb 8.6, plts 173, no bleeding noted. Initiated Coumadin 12/1. High fall risk. CHADS-VASc = 6  Goal of Therapy:  INR 2-3 Anti-Xa level 0.6-1 units/ml 4hrs after LMWH dose given Monitor platelets by anticoagulation protocol: Yes   Plan:  - DC Lovenox - Hold Coumadin tonight - Daily INR, CBC q72h  - Monitor for s/sx of bleeding  Thank you Anette Guarneri, PharmD [phone] 05/27/2015 9:03 AM

## 2015-05-27 NOTE — Progress Notes (Signed)
[facility]                                                                              Patient Demographics  Terry [name], is a 80 y.o. male, DOB - 1934/11/06, SA:[id number]  Admit date - 05/13/2015   Admitting Physician Edwin Dada, MD  Outpatient Primary MD for the patient is [name], DO  LOS - 13   Chief Complaint  Patient presents with  . Cough  . Shortness of Breath       Brief HPI   80 year old male with a history of COPD, hypertension, CAD/CABG, CKD stage IV, atrial fibrillation diagnosed October 2015 on apixaban presented with one-week history of worsening cough and shortness of breath. The patient was seen by his primary care provider 4 days prior to admission and prescribed Augmentin and prednisone without improvement. He presented to the emergency department where he was noted to have oxygen saturation of 80% on room air and found to be in respiratory distress. The patient was started on doxycycline and Solu-Medrol. The patient has also been describing symptoms of orthopnea and PND with increasing abdominal girth and lower extremity edema for the past week prior to admission.  After admission, the patient was started on intravenous steroids. His breathing did not improve. He subsequently started on intravenous furosemide for volume excess. Ultimately, the patient was declared ESRD and dialysis catheter was placed and dialysis initiated on 05/17/2015.   Assessment & Plan    Principal Problem: Acute respiratory failure with hypoxia -Secondary to CHF and COPD exacerbation, volume overload, O2 sats 96% on 2L  -continue Pulmonary hygiene and treatment for COPD/HD for volume control  - Repeat chest x-ray 12/4, shows pleural effusion with compressive right-sided atelectasis, however cough is worsening per patient with phlegm,hence placed on IV Zosyn and Vanc (history of +MRSA PCR). Per patient's wife, had a history of severe pneumonia in the  past. - Added antitussives   Active problems Acute diastolic CHF  -remains clinically volume overloaded, - furosemide discontinued as patient is started on hemodialysis.  -2-D echo on 05/16/15 showed EF 55-60%, trivial MR/TR - Weight 208 pounds on 11/22> 198 pounds on 12/5, weight up due to volume overload. -Volume now managed with HD, low-sodium diet however BP also limiting factor   Acute on chronic renal failure (CKD 4-5), Now new on ESRD started on HD -Previous Baseline creatinine 3.2-3.4. Nephrology and vascular surgery was consulted. Patient underwent right AV fistula placement on 05/21/15. -PermCath was placed by radiology on 05/17/15 -Patient started on hemodialysis, apparently needs Klonopin 1 mg prior to HD for anxiety and cramping. Could not complete treatment on 12/3 due to hypotension.  - Robaxin for cramping with HD  - CLIPPED with [facility] HD outpatient unit but not ready yet.  COPD exacerbation  -Patient has >60 pk years, completed steroid tapering, continue bronchodilators  Atrial fibrillation  -CHADS-VASc = 6 -Discontinued apixaban due to worsening renal function/new ESRD -continue lovenox and coumadin bridge-INR 1.44 -Continue metoprolol tartrate for rate control   Hypertension -Continue metoprolol tartrate at adjusted dose -advise to follow low sodium diet -HD helping with volume  and BP control  Diabetes mellitus type 2 -Checked hemoglobin A1c--6.6 -pt agreeable to start po meds upon d/c (low dose glipizide or glyburide) -NovoLog sliding scale while inpatient -advise to follow low carb diet   Coronary artery disease -Cardiac cath 11/30/1997. Mild distal Left main stenosis, 25% proximal LAD, minor irregularities Circumflex, 95% mid RCA stenosis treated with 3.5 x 32 mm bare metal stent -no angina presently   Hyponatremia -Secondary to worsening renal function and fluid overload -improving with HD  Code Status: Full code  Family  Communication: Discussed in detail with the patient, all imaging results, lab results explained to the patient    Disposition Plan:   Time Spent in minutes   25 minutes  Procedures  HD  Consults   renal  DVT Prophylaxis  Lovenox  Medications  Scheduled Meds: . atorvastatin  20 mg Oral q1800  . benzonatate  100 mg Oral TID  . budesonide-formoterol  2 puff Inhalation BID  . [START ON 05/31/2015] darbepoetin (ARANESP) injection - DIALYSIS  200 mcg Intravenous Q Fri-HD  . doxercalciferol  1 mcg Intravenous Q M,W,F-HD  . insulin aspart  0-15 Units Subcutaneous TID WC  . insulin aspart  0-5 Units Subcutaneous QHS  . ipratropium-albuterol  3 mL Nebulization TID  . magic mouthwash  5 mL Oral QID  . metoprolol  25 mg Oral BID  . midodrine  10 mg Oral Q M,W,F-HD  . piperacillin-tazobactam (ZOSYN)  IV  2.25 g Intravenous 3 times per day  . sodium chloride  500 mL Intravenous Once  . sodium chloride  3 mL Intravenous Q12H  . tiotropium  18 mcg Inhalation Daily  . vancomycin  1,000 mg Intravenous Q M,W,F-HD  . Warfarin - Pharmacist Dosing Inpatient   Does not apply q1800   Continuous Infusions: . sodium chloride 10 mL/hr at 05/21/15 0946   PRN Meds:.sodium chloride, sodium chloride, albuterol, alteplase, clonazePAM, dextromethorphan, guaiFENesin-dextromethorphan, heparin, [START ON 05/28/2015] heparin, HYDROcodone-acetaminophen, lidocaine (PF), lidocaine-prilocaine, methocarbamol, morphine injection, ondansetron **OR** ondansetron (ZOFRAN) IV, oxyCODONE, pentafluoroprop-tetrafluoroeth   Antibiotics   Anti-infectives    Start     Dose/Rate Route Frequency Ordered Stop   05/27/15 1200  vancomycin (VANCOCIN) IVPB 1000 mg/200 mL premix     1,000 mg 200 mL/hr over 60 Minutes Intravenous Every M-W-F (Hemodialysis) 05/26/15 1433     05/26/15 1500  piperacillin-tazobactam (ZOSYN) IVPB 2.25 g     2.25 g 100 mL/hr over 30 Minutes Intravenous 3 times per day 05/26/15 1433     05/26/15 1445   vancomycin (VANCOCIN) 2,000 mg in sodium chloride 0.9 % 500 mL IVPB     2,000 mg 250 mL/hr over 120 Minutes Intravenous NOW 05/26/15 1433 05/26/15 1904   05/20/15 0600  ceFAZolin (ANCEF) IVPB 1 g/50 mL premix    Comments:  Send with pt to OR   1 g 100 mL/hr over 30 Minutes Intravenous To ShortStay Surgical 05/20/15 1011 05/21/15 0600   05/17/15 1530  [MAR Hold]  ceFAZolin (ANCEF) IVPB 2 g/50 mL premix     (MAR Hold since 05/21/15 0912)   2 g 100 mL/hr over 30 Minutes Intravenous  Once 05/17/15 1518 05/21/15 1059   05/17/15 1448  ceFAZolin (ANCEF) 2-3 GM-% IVPB SOLR    CommentsDesiree Hane   : cabinet override      05/17/15 1448 05/17/15 1519   05/14/15 1000  doxycycline (VIBRA-TABS) tablet 100 mg     100 mg Oral Every 12 hours 05/14/15 0116 05/19/15 2218  05/13/15 2145  azithromycin (ZITHROMAX) 500 mg in dextrose 5 % 250 mL IVPB     500 mg 250 mL/hr over 60 Minutes Intravenous  Once 05/13/15 2137 05/13/15 2258        Subjective:   Sincear Tanton was seen and examined today. Somewhat irritable this morning, reported that he had not slept well, difficulty laying flat due to shortness of breath. Sitting up in the chair. Still coughing with phlegm. No fevers or chills.  Patient denies dizziness, abdominal pain, N/V/D/C, new weakness, numbess, tingling.    Objective:   Blood pressure 110/52, pulse 105, temperature 97.9 F (36.6 C), temperature source Oral, resp. rate 22, height 5\' 8"  (1.727 m), weight 90.6 kg (199 lb 11.8 oz), SpO2 96 %.  Wt Readings from Last 3 Encounters:  05/27/15 90.6 kg (199 lb 11.8 oz)  05/01/15 93.895 kg (207 lb)  03/20/15 94.348 kg (208 lb)     Intake/Output Summary (Last 24 hours) at 05/27/15 1231 Last data filed at 05/27/15 [id number]  Gross per 24 hour  Intake     60 ml  Output     50 ml  Net     10 ml    Exam  General: Alert and oriented x 3, NAD  HEENT:  PERRLA, EOM  Neck: Supple, no JVD, no masses  CVS: S1 S2 clear, RRR  Respiratory:  Scattered rhonchi bilaterally, bibasilar crackles  Abdomen: Soft, nontender, nondistended, + bowel sounds  Ext: no cyanosis clubbing, 3+ pitting edema  Neuro: no new deficits  Skin: No rashes  Psych: Normal affect and demeanor, alert and oriented x3    Data Review   Micro Results Recent Results (from the past 240 hour(s))  Surgical pcr screen     Status: Abnormal   Collection Time: 05/21/15  5:09 AM  Result Value Ref Range Status   MRSA, PCR POSITIVE (A) NEGATIVE Final   Staphylococcus aureus POSITIVE (A) NEGATIVE Final    Comment:        The Xpert SA Assay (FDA approved for NASAL specimens in patients over 28 years of age), is one component of a comprehensive surveillance program.  Test performance has been validated by [facility] for patients greater than or equal to 7 year old. It is not intended to diagnose infection nor to guide or monitor treatment.     Radiology Reports Dg Chest 2 View  05/26/2015  CLINICAL DATA:  History of pneumonia.  Shortness of breath. EXAM: CHEST  2 VIEW COMPARISON:  05/24/2015 FINDINGS: Large bore right internal jugular approach central venous catheter is stable. Cardiomediastinal silhouette is normal. Mediastinal contours appear intact. There is no evidence of pneumothorax. There is a right pleural effusion with linear airspace opacity in the right lower lobe likely representing atelectasis. The left costophrenic angle is incompletely visualized, and therefore small left pleural effusion cannot be excluded. Osseous structures are without acute abnormality. Soft tissues are grossly normal. IMPRESSION: Right pleural effusion with probably compressive atelectasis in the right lower lobe. Left costophrenic angle incompletely visualized, and therefore small left pleural effusion cannot be excluded. Electronically Signed   By: Fidela Salisbury M.D.   On: 05/26/2015 11:09   Dg Chest 2 View  05/13/2015  CLINICAL DATA:  80 year old with 1 week  history of productive cough and severe shortness of breath. Former smoker with current history of COPD. EXAM: CHEST  2 VIEW COMPARISON:  05/28/2014 and earlier. FINDINGS: AP erect and lateral imaging was performed. Cardiac silhouette mildly enlarged with a right  coronary artery stent. Thoracic aorta atherosclerotic, unchanged. Prominent central pulmonary arteries, unchanged. Scarring in the lower lobes, right greater than left, unchanged. Hyperinflation, emphysematous changes in the upper lobes, and mild central peribronchial thickening, unchanged. No new pulmonary parenchymal abnormalities. Degenerative disc disease and spondylosis involving the thoracic spine. IMPRESSION: COPD/emphysema. Scarring in the lower lobes, right greater than left. No acute cardiopulmonary disease. Stable examination. Electronically Signed   By: Evangeline Dakin M.D.   On: 05/13/2015 21:03   US Renal  05/15/2015  CLINICAL DATA:  Acute renal injury EXAM: RENAL / URINARY TRACT ULTRASOUND COMPLETE COMPARISON:  10/24/2014 FINDINGS: Right Kidney: Length: 14.8 cm. Multiple cysts are noted. The largest of these measures 2.2 cm in greatest dimension. Left Kidney: Length: 14.0 cm. Multiple cysts are noted. The largest of these measures 3.1 cm in greatest dimension. Bladder: Appears normal for degree of bladder distention. IMPRESSION: Bilateral renal cysts similar to that seen on prior MRI examination. No acute obstructive changes are noted. Electronically Signed   By: Inez Catalina M.D.   On: 05/15/2015 16:29   Ir Fluoro Guide Cv Line Right  05/17/2015  INDICATION: Chronic kidney disease with early uremia. Need to start hemodialysis. EXAM: FLUOROSCOPIC AND ULTRASOUND GUIDED PLACEMENT OF A TUNNELED DIALYSIS CATHETER Physician: Stephan Minister. Henn, MD FLUOROSCOPY TIME:  42 seconds, 13.2 mGy MEDICATIONS: 2 g Ancef. As antibiotic prophylaxis, Ancef was ordered pre-procedure and administered intravenously within one hour of incision. 0.5 mg versed, 25  mcg fentanyl. A radiology nurse monitored the patient for moderate sedation. ANESTHESIA/SEDATION: Moderate sedation time: 30 minutes PROCEDURE: Informed consent was obtained for placement of a tunneled dialysis catheter. The patient was placed supine on the interventional table. Ultrasound confirmed a patent right internal jugularvein. Ultrasound images were obtained for documentation. The right side of the neck was prepped and draped in a sterile fashion. The right side of the neck was anesthetized with 1% lidocaine. Maximal barrier sterile technique was utilized including caps, mask, sterile gowns, sterile gloves, sterile drape, hand hygiene and skin antiseptic. A small incision was made with #11 blade scalpel. A 21 gauge needle directed into the right internal jugular vein with ultrasound guidance. A micropuncture dilator set was placed. A 23 cm tip to cuff Palindrome catheter was selected. The skin below the right clavicle was anesthetized and a small incision was made with an #11 blade scalpel. A subcutaneous tunnel was formed to the vein dermatotomy site. The catheter was brought through the tunnel. The vein dermatotomy site was dilated to accommodate a peel-away sheath. The catheter was placed through the peel-away sheath and directed into the central venous structures. The tip of the catheter was placed at the superior cavoatrial junction with fluoroscopy. Fluoroscopic images were obtained for documentation. Both lumens were found to aspirate and flush well. The proper amount of heparin was flushed in both lumens. The vein dermatotomy site was closed using a single layer of absorbable suture and Dermabond. The catheter was secured to the skin using Prolene suture. FINDINGS: Catheter tip at the superior cavoatrial junction. Estimated blood loss: Minimal COMPLICATIONS: None IMPRESSION: Successful placement of a right jugular tunneled dialysis catheter using ultrasound and fluoroscopic guidance. Electronically  Signed   By: Markus Daft M.D.   On: 05/17/2015 16:23   Ir US Guide Vasc Access Right  05/17/2015  INDICATION: Chronic kidney disease with early uremia. Need to start hemodialysis. EXAM: FLUOROSCOPIC AND ULTRASOUND GUIDED PLACEMENT OF A TUNNELED DIALYSIS CATHETER Physician: Stephan Minister. Henn, MD FLUOROSCOPY TIME:  42 seconds, 13.2  mGy MEDICATIONS: 2 g Ancef. As antibiotic prophylaxis, Ancef was ordered pre-procedure and administered intravenously within one hour of incision. 0.5 mg versed, 25 mcg fentanyl. A radiology nurse monitored the patient for moderate sedation. ANESTHESIA/SEDATION: Moderate sedation time: 30 minutes PROCEDURE: Informed consent was obtained for placement of a tunneled dialysis catheter. The patient was placed supine on the interventional table. Ultrasound confirmed a patent right internal jugularvein. Ultrasound images were obtained for documentation. The right side of the neck was prepped and draped in a sterile fashion. The right side of the neck was anesthetized with 1% lidocaine. Maximal barrier sterile technique was utilized including caps, mask, sterile gowns, sterile gloves, sterile drape, hand hygiene and skin antiseptic. A small incision was made with #11 blade scalpel. A 21 gauge needle directed into the right internal jugular vein with ultrasound guidance. A micropuncture dilator set was placed. A 23 cm tip to cuff Palindrome catheter was selected. The skin below the right clavicle was anesthetized and a small incision was made with an #11 blade scalpel. A subcutaneous tunnel was formed to the vein dermatotomy site. The catheter was brought through the tunnel. The vein dermatotomy site was dilated to accommodate a peel-away sheath. The catheter was placed through the peel-away sheath and directed into the central venous structures. The tip of the catheter was placed at the superior cavoatrial junction with fluoroscopy. Fluoroscopic images were obtained for documentation. Both lumens  were found to aspirate and flush well. The proper amount of heparin was flushed in both lumens. The vein dermatotomy site was closed using a single layer of absorbable suture and Dermabond. The catheter was secured to the skin using Prolene suture. FINDINGS: Catheter tip at the superior cavoatrial junction. Estimated blood loss: Minimal COMPLICATIONS: None IMPRESSION: Successful placement of a right jugular tunneled dialysis catheter using ultrasound and fluoroscopic guidance. Electronically Signed   By: Markus Daft M.D.   On: 05/17/2015 16:23   Dg Chest Port 1 View  05/24/2015  CLINICAL DATA:  Shortness of breath. Finished hemodialysis 1 hour previously. EXAM: PORTABLE CHEST 1 VIEW COMPARISON:  05/13/2015. FINDINGS: Normal sized heart. Clear lungs. Interval right jugular catheter with its tip in the inferior aspect of the superior vena cava. No pneumothorax. Mild right basilar atelectasis and patchy opacity with mild progression. Clear left lung. Mild lower thoracic spine degenerative changes. IMPRESSION: Mild right basilar atelectasis and possible pneumonia. Electronically Signed   By: Claudie Revering M.D.   On: 05/24/2015 20:50    CBC  Recent Labs Lab 05/22/15 0425 05/22/15 0700 05/24/15 1400 05/25/15 0529 05/27/15 1000  WBC 19.0* 19.9* 14.8* 12.6* 15.3*  HGB 8.7* 8.6* 8.7* 8.6* 8.3*  HCT 26.7* 26.2* 26.5* 28.0* 25.9*  PLT 202 199 153 173 141*  MCV 90.8 90.7 94.0 97.2 94.9  MCH 29.6 29.8 30.9 29.9 30.4  MCHC 32.6 32.8 32.8 30.7 32.0  RDW 14.4 14.4 15.5 15.9* 16.3*    Chemistries   Recent Labs Lab 05/21/15 0940 05/22/15 0700 05/24/15 1400 05/25/15 1424 05/27/15 1000  NA 131* 132* 133* 132* 129*  K 3.7 4.3 3.6 4.1 4.5  CL  --  95* 96* 97* 94*  CO2  --  25 26 26 23   GLUCOSE 121* 137* 154* 101* 190*  BUN  --  81* 35* 34* 41*  CREATININE  --  5.73* 4.00* 4.51* 5.88*  CALCIUM  --  8.3* 8.1* 8.0* 8.1*    ------------------------------------------------------------------------------------------------------------------ estimated creatinine clearance is 11.3 mL/min (by C-G formula based on Cr of 5.88). ------------------------------------------------------------------------------------------------------------------  No results for input(s): HGBA1C in the last 72 hours. ------------------------------------------------------------------------------------------------------------------ No results for input(s): CHOL, HDL, LDLCALC, TRIG, CHOLHDL, LDLDIRECT in the last 72 hours. ------------------------------------------------------------------------------------------------------------------ No results for input(s): TSH, T4TOTAL, T3FREE, THYROIDAB in the last 72 hours.  Invalid input(s): FREET3 ------------------------------------------------------------------------------------------------------------------ No results for input(s): VITAMINB12, FOLATE, FERRITIN, TIBC, IRON, RETICCTPCT in the last 72 hours.  Coagulation profile  Recent Labs Lab 05/23/15 0513 05/24/15 0632 05/25/15 0529 05/26/15 0540 05/27/15 0539  INR 1.17 1.17 1.37 1.44 2.51*    No results for input(s): DDIMER in the last 72 hours.  Cardiac Enzymes No results for input(s): CKMB, TROPONINI, MYOGLOBIN in the last 168 hours.  Invalid input(s): CK ------------------------------------------------------------------------------------------------------------------ Invalid input(s): [place]  05/25/15 2142 05/26/15 0841 05/26/15 1157 05/26/15 1719 05/26/15 2157 05/27/15 0740  GLUCAP 114* 111* 108* 147* 106* 110*     Eiliyah Reh M.D. [facility] 05/27/2015, 12:31 PM  Pager: [phone] Between 7am to 7pm - call Pager - [phone]  After 7pm go to [email or web address] - password [email or web address]  Call night coverage person covering after 7pm

## 2015-05-27 NOTE — Procedures (Signed)
I have personally attended this patient's dialysis session.   Increase goal to 4 liters d/t marked LE edema and SOB. BP 120's (midodrine) 4K bath - no renal panel yet from this AM.  Jamal Maes, MD [place] Pager 05/27/2015, 10:50 AM

## 2015-05-28 ENCOUNTER — Ambulatory Visit: Payer: Medicare PPO | Admitting: Internal Medicine

## 2015-05-28 LAB — CBC
HCT: 27.1 % — ABNORMAL LOW (ref 39.0–52.0)
Hemoglobin: 8.7 g/dL — ABNORMAL LOW (ref 13.0–17.0)
MCH: 30.1 pg (ref 26.0–34.0)
MCHC: 32.1 g/dL (ref 30.0–36.0)
MCV: 93.8 fL (ref 78.0–100.0)
PLATELETS: 159 10*3/uL (ref 150–400)
RBC: 2.89 MIL/uL — AB (ref 4.22–5.81)
RDW: 16 % — ABNORMAL HIGH (ref 11.5–15.5)
WBC: 15 10*3/uL — ABNORMAL HIGH (ref 4.0–10.5)

## 2015-05-28 LAB — RENAL FUNCTION PANEL
Albumin: 2.5 g/dL — ABNORMAL LOW (ref 3.5–5.0)
Anion gap: 12 (ref 5–15)
BUN: 36 mg/dL — AB (ref 6–20)
CHLORIDE: 95 mmol/L — AB (ref 101–111)
CO2: 26 mmol/L (ref 22–32)
CREATININE: 5.22 mg/dL — AB (ref 0.61–1.24)
Calcium: 8.2 mg/dL — ABNORMAL LOW (ref 8.9–10.3)
GFR, EST AFRICAN AMERICAN: 11 mL/min — AB (ref >60)
GFR, EST NON AFRICAN AMERICAN: 9 mL/min — AB (ref >60)
Glucose, Bld: 216 mg/dL — ABNORMAL HIGH (ref 65–99)
POTASSIUM: 3.8 mmol/L (ref 3.5–5.1)
Phosphorus: 4.1 mg/dL (ref 2.5–4.6)
Sodium: 133 mmol/L — ABNORMAL LOW (ref 135–145)

## 2015-05-28 LAB — GLUCOSE, CAPILLARY
GLUCOSE-CAPILLARY: 124 mg/dL — AB (ref 65–99)
GLUCOSE-CAPILLARY: 137 mg/dL — AB (ref 65–99)
GLUCOSE-CAPILLARY: 120 mg/dL — AB (ref 65–99)

## 2015-05-28 LAB — IRON AND TIBC
IRON: 11 ug/dL — AB (ref 45–182)
Saturation Ratios: 7 % — ABNORMAL LOW (ref 17.9–39.5)
TIBC: 157 ug/dL — AB (ref 250–450)
UIBC: 146 ug/dL

## 2015-05-28 LAB — PROTIME-INR
INR: 2.5 — ABNORMAL HIGH (ref 0.00–1.49)
PROTHROMBIN TIME: 26.7 s — AB (ref 11.6–15.2)

## 2015-05-28 MED ORDER — MIDODRINE HCL 5 MG PO TABS
10.0000 mg | ORAL_TABLET | Freq: Three times a day (TID) | ORAL | Status: DC
  Administered 2015-05-28 – 2015-06-03 (×18): 10 mg via ORAL
  Filled 2015-05-28 (×18): qty 2

## 2015-05-28 MED ORDER — MIDODRINE HCL 5 MG PO TABS
10.0000 mg | ORAL_TABLET | Freq: Once | ORAL | Status: AC
  Administered 2015-05-28: 10 mg via ORAL
  Filled 2015-05-28: qty 2

## 2015-05-28 MED ORDER — WARFARIN SODIUM 5 MG PO TABS
5.0000 mg | ORAL_TABLET | Freq: Once | ORAL | Status: AC
  Administered 2015-05-28: 5 mg via ORAL
  Filled 2015-05-28: qty 1

## 2015-05-28 MED ORDER — BENZONATATE 100 MG PO CAPS
200.0000 mg | ORAL_CAPSULE | Freq: Three times a day (TID) | ORAL | Status: DC
  Administered 2015-05-28 – 2015-06-04 (×20): 200 mg via ORAL
  Filled 2015-05-28 (×20): qty 2

## 2015-05-28 MED ORDER — HYDROCOD POLST-CPM POLST ER 10-8 MG/5ML PO SUER
5.0000 mL | Freq: Two times a day (BID) | ORAL | Status: DC
  Administered 2015-05-28 – 2015-05-30 (×5): 5 mL via ORAL
  Filled 2015-05-28 (×7): qty 5

## 2015-05-28 MED ORDER — DOXERCALCIFEROL 4 MCG/2ML IV SOLN
INTRAVENOUS | Status: AC
  Filled 2015-05-28: qty 2

## 2015-05-28 NOTE — Progress Notes (Signed)
Tx ended at 1435.  Assessment unchanged.  Report called to bedside RN  Pt tolerated tx well.  RIJ flushed and heparin locked.  Caps changed.  Goal of 3532mL and net fluid removal of 3L.  Post dialysis standing weight of 87.5 kg.

## 2015-05-28 NOTE — Progress Notes (Signed)
BP low 89/43. MD notified. She advised to hold the Lopressor and keep watching the BP. Lopressor was held. BP retaken after 2 hrs, reads 109/37 and pulse 89. We will continue to monitor.

## 2015-05-28 NOTE — Progress Notes (Signed)
Pt arrived per bed at 1027.  Report received from [place], RN  A & O X 4 Lungs coarse to auscultation. Tachycardic Generalized edema.   RIJ accessed, both ports aspirated and flushed with NS.  Tx initiated at 1034 with goal of 3543mL and net of 3L.  Will continue to monitor.

## 2015-05-28 NOTE — Progress Notes (Signed)
ANTICOAGULATION CONSULT NOTE - Follow Up Consult  Pharmacy Consult for Warfarin Indication: atrial fibrillation  Allergies  Allergen Reactions  . Yellow Dyes (Non-Tartrazine) Other (See Comments)    Burns arms  . Levaquin [Levofloxacin In D5w] Itching    Patient Measurements: Height: 5\' 8"  (172.7 cm) Weight: 194 lb 3.6 oz (88.1 kg) IBW/kg (Calculated) : 68.4  Vital Signs: Temp: 97.7 F (36.5 C) (12/06 0506) Temp Source: Oral (12/06 0506) BP: 98/43 mmHg (12/06 0506) Pulse Rate: 106 (12/06 0506)  Labs:  Recent Labs  05/25/15 1424 05/26/15 0540 05/27/15 0539 05/27/15 1000 05/28/15 0513  HGB  --   --   --  8.3* 8.7*  HCT  --   --   --  25.9* 27.1*  PLT  --   --   --  141* 159  LABPROT  --  17.6* 26.7*  --  26.7*  INR  --  1.44 2.51*  --  2.50*  CREATININE 4.51*  --   --  5.88*  --     Estimated Creatinine Clearance: 11.2 mL/min (by C-G formula based on Cr of 5.88).   Assessment: 80 y/o male with CKD V who was on apixiban PTA for Afib who has now transitioned to ESRD and is HD dependent.  AC/heme: Lovenox for hx afib (PTA apixaban - changed d/t renal dysfxn, renal MD wanted lovenox) -  INR = 2.5 CBC stable, no bleeding noted. Initiated Coumadin 12/1. High fall risk. CHADS-VASc = 6  Goal of Therapy:  INR 2-3 Anti-Xa level 0.6-1 units/ml 4hrs after LMWH dose given Monitor platelets by anticoagulation protocol: Yes   Plan:  - Coumadin 5 mg po x 1 tonight - Daily INR, CBC q72h  - Monitor for s/sx of bleeding  Thank you Anette Guarneri, PharmD [phone] 05/28/2015 9:19 AM

## 2015-05-28 NOTE — Progress Notes (Signed)
Pt's B/P =90/40;MD on call [place] made aware & ordered to monitor B/P .

## 2015-05-28 NOTE — Progress Notes (Signed)
[facility] Rounding Note  Subjective:  Seen in HD again today Still coughing and dyspneic when flat Had HD yesterday net UF 2.7 liters - didn't make a dent in his edema  Objective:  Intake/Output from previous day: 12/05 0701 - 12/06 0700 In: 150 [P.O.:150] Out: 2702  Intake/Output this shift: Total I/O In: 210 [P.O.:210] Out: -   Physical Examination BP 98/43 mmHg  Pulse 106  Temp(Src) 97.7 F (36.5 C) (Oral)  Resp 20  Ht 5\' 8"  (1.727 m)  Wt 88.1 kg (194 lb 3.6 oz)  BMI 29.54 kg/m2  SpO2 98% Weight trending:(Max weight this hosp 100.6 kg) 12/5 pre wt 90.6 Post wt 88.1 12/6 pre wt ?      Post wt pending Disheveled in appearance Lying in bed, coughing Coarse BS TDC running at 400 dry dressing R side Abdomen obese 3+ pitting edema bilateral LE's no real change from yesterday R AVF with hematoma at incision site, + bruit, large amount bruising. Nice firm vein  Lab Results:  Recent Labs  05/27/15 1000 05/28/15 0513  WBC 15.3* 15.0*  HGB 8.3* 8.7*  HCT 25.9* 27.1*  PLT 141* 159     Recent Labs  05/25/15 1424 05/27/15 1000  NA 132* 129*  K 4.1 4.5  CL 97* 94*  CO2 26 23  GLUCOSE 101* 190*  BUN 34* 41*  CREATININE 4.51* 5.88*  CALCIUM 8.0* 8.1*     Dg Chest 2 View  05/26/2015  CLINICAL DATA:  History of pneumonia.  Shortness of breath. EXAM: CHEST  2 VIEW COMPARISON:  05/24/2015 FINDINGS: Large bore right internal jugular approach central venous catheter is stable. Cardiomediastinal silhouette is normal. Mediastinal contours appear intact. There is no evidence of pneumothorax. There is a right pleural effusion with linear airspace opacity in the right lower lobe likely representing atelectasis. The left costophrenic angle is incompletely visualized, and therefore small left pleural effusion cannot be excluded. Osseous structures are without acute abnormality. Soft tissues are grossly normal. IMPRESSION: Right pleural effusion with probably  compressive atelectasis in the right lower lobe. Left costophrenic angle incompletely visualized, and therefore small left pleural effusion cannot be excluded. Electronically Signed   By: Fidela Salisbury M.D.   On: 05/26/2015 11:09    Scheduled Medications: . atorvastatin  20 mg Oral q1800  . benzonatate  100 mg Oral TID  . budesonide-formoterol  2 puff Inhalation BID  . [START ON 05/31/2015] darbepoetin (ARANESP) injection - DIALYSIS  200 mcg Intravenous Q Fri-HD  . doxercalciferol  1 mcg Intravenous Q M,W,F-HD  . insulin aspart  0-15 Units Subcutaneous TID WC  . insulin aspart  0-5 Units Subcutaneous QHS  . ipratropium-albuterol  3 mL Nebulization TID  . magic mouthwash  5 mL Oral QID  . metoprolol  25 mg Oral BID  . midodrine  10 mg Oral Q M,W,F-HD  . piperacillin-tazobactam (ZOSYN)  IV  2.25 g Intravenous 3 times per day  . sodium chloride  500 mL Intravenous Once  . sodium chloride  3 mL Intravenous Q12H  . tiotropium  18 mcg Inhalation Daily  . [START ON 05/31/2015] vancomycin  1,000 mg Intravenous Q M,W,F-HD  . warfarin  5 mg Oral ONCE-1800  . Warfarin - Pharmacist Dosing Inpatient   Does not apply q1800    Background 80 with over 7yr HTN, DJD, Severe COPD, CAD, prior ureteral stents, and known advancing late stage 4 CKD. Baseline Cr 3.9. Adm with cough, SOB, edema, worsening renal failure.  Dialysis  initiated for worsening renal function/volume overload  ->new ESRD.  Assessment/Plan:  1. New start ESRD, dialysis dependent. 1st HD was 11/26. S/p AVF RUA Dr. Scot Dock 11/29. Hospital [address] 11/25. Requiring HD daily for volume overload.  CLIPPED for [facility] MWF 2nd shift but not ready to go home yet.  HD today, again tomorrow for volume. Says he is limiting his fluids. Midodrine for BP increase to TID 2. Anemia on Darbe 200 QThurs Last received 12/2. Since MWF will change to QFriday 3. HPTH on vit D - on calcitriol 0.25/day. Last PTH 159 on 11/23. Will get Hectorol at [facility] - change to 1 mcg  TIW on MWF.  4. COPD longstanding - bronchodilators 5. Diastolic heart failure 6. AFib -- coumadin. Pharmacy dosing. Metoprolol for rate control. 7. CAD 8. DJD 9. CHF/Vol overload - still LE edema 3+/CXR pleural effusions - dialysis/UF daily. 10. DM2 11. Cough with CXR 12/4 compressive atelectasis right lung/leukocytosis  - poss PNA - now on vanco and zosyn 12. MRSA PCR +  Jamal Maes, MD [facility] [phone] Pager 05/28/2015, 11:09 AM

## 2015-05-28 NOTE — Progress Notes (Signed)
[facility]                                                                              Patient Demographics  Terry [name], is a 80 y.o. male, DOB - 01-11-1935, CM:[id number]  Admit date - 05/13/2015   Admitting Physician Edwin Dada, MD  Outpatient Primary MD for the patient is [name], DO  LOS - 14   Chief Complaint  Patient presents with  . Cough  . Shortness of Breath       Brief HPI   80 year old male with a history of COPD, hypertension, CAD/CABG, CKD stage IV, atrial fibrillation diagnosed October 2015 on apixaban presented with one-week history of worsening cough and shortness of breath. The patient was seen by his primary care provider 4 days prior to admission and prescribed Augmentin and prednisone without improvement. He presented to the emergency department where he was noted to have oxygen saturation of 80% on room air and found to be in respiratory distress. The patient was started on doxycycline and Solu-Medrol. The patient has also been describing symptoms of orthopnea and PND with increasing abdominal girth and lower extremity edema for the past week prior to admission.  After admission, the patient was started on intravenous steroids. His breathing did not improve. He subsequently started on intravenous furosemide for volume excess. Ultimately, the patient was declared ESRD and dialysis catheter was placed and dialysis initiated on 05/17/2015.   Assessment & Plan    Principal Problem: Acute respiratory failure with hypoxia - Secondary to CHF and COPD exacerbation, volume overload, O2 sats 96% on 2L  - continue Pulmonary hygiene and treatment for COPD/HD for volume control  - Repeat chest x-ray 12/4, shows pleural effusion with compressive right-sided atelectasis, however cough is worsening per patient with phlegm,hence placed on IV Zosyn and Vanc (history of +MRSA PCR). Per patient's wife, had a history of severe pneumonia in  the past. - placed tesselon perles, tussinex (cough suppressant). Patient states cough is bothering significantly and is unable to sleep,    Active problems Acute diastolic CHF  - remains clinically volume overloaded, - furosemide discontinued as patient is started on hemodialysis.  -2-D echo on 05/16/15 showed EF 55-60%, trivial MR/TR - Weight 208 pounds on 11/22> 198 pounds on 12/5, weight up due to volume overload. -Volume now managed with HD, low-sodium diet however BP also limiting factor   Acute on chronic renal failure (CKD 4-5), Now new on ESRD started on HD -Previous Baseline creatinine 3.2-3.4. Nephrology and vascular surgery was consulted. Patient underwent right AV fistula placement on 05/21/15. -PermCath was placed by radiology on 05/17/15 -Patient started on hemodialysis, apparently needs Klonopin 1 mg prior to HD for anxiety and cramping.  - Robaxin for cramping with HD  - CLIPPED with [facility] HD outpatient unit but not ready yet.  COPD exacerbation  -Patient has >60 pk years, completed steroid tapering, continue bronchodilators  Atrial fibrillation  -CHADS-VASc = 6 -Discontinued apixaban due to worsening renal function/new ESRD -continue lovenox and coumadin bridge-INR 1.44 -Continue metoprolol tartrate for rate control   Hypertension -Continue metoprolol tartrate at adjusted dose -  advise to follow low sodium diet -HD helping with volume and BP control  Diabetes mellitus type 2 -Checked hemoglobin A1c--6.6 -pt agreeable to start po meds upon d/c (low dose glipizide or glyburide) -NovoLog sliding scale while inpatient -advise to follow low carb diet   Coronary artery disease -Cardiac cath 11/30/1997. Mild distal Left main stenosis, 25% proximal LAD, minor irregularities Circumflex, 95% mid RCA stenosis treated with 3.5 x 32 mm bare metal stent -no angina presently   Hyponatremia -Secondary to worsening renal function and fluid  overload -improving with HD  Code Status: Full code  Family Communication: Discussed in detail with the patient, all imaging results, lab results explained to the patient    Disposition Plan:   Time Spent in minutes   15 minutes  Procedures  HD  Consults   renal  DVT Prophylaxis  Lovenox  Medications  Scheduled Meds: . atorvastatin  20 mg Oral q1800  . benzonatate  200 mg Oral TID  . budesonide-formoterol  2 puff Inhalation BID  . chlorpheniramine-HYDROcodone  5 mL Oral Q12H  . [START ON 05/31/2015] darbepoetin (ARANESP) injection - DIALYSIS  200 mcg Intravenous Q Fri-HD  . doxercalciferol      . doxercalciferol  1 mcg Intravenous Q M,W,F-HD  . insulin aspart  0-15 Units Subcutaneous TID WC  . insulin aspart  0-5 Units Subcutaneous QHS  . ipratropium-albuterol  3 mL Nebulization TID  . magic mouthwash  5 mL Oral QID  . metoprolol  25 mg Oral BID  . midodrine  10 mg Oral TID WC  . piperacillin-tazobactam (ZOSYN)  IV  2.25 g Intravenous 3 times per day  . sodium chloride  500 mL Intravenous Once  . sodium chloride  3 mL Intravenous Q12H  . tiotropium  18 mcg Inhalation Daily  . [START ON 05/31/2015] vancomycin  1,000 mg Intravenous Q M,W,F-HD  . warfarin  5 mg Oral ONCE-1800  . Warfarin - Pharmacist Dosing Inpatient   Does not apply q1800   Continuous Infusions: . sodium chloride 10 mL/hr at 05/21/15 0946   PRN Meds:.albuterol, clonazePAM, HYDROcodone-acetaminophen, methocarbamol, morphine injection, ondansetron **OR** ondansetron (ZOFRAN) IV, oxyCODONE   Antibiotics   Anti-infectives    Start     Dose/Rate Route Frequency Ordered Stop   05/31/15 1200  vancomycin (VANCOCIN) IVPB 1000 mg/200 mL premix     1,000 mg 200 mL/hr over 60 Minutes Intravenous Every M-W-F (Hemodialysis) 05/27/15 1351     05/27/15 1200  vancomycin (VANCOCIN) IVPB 1000 mg/200 mL premix  Status:  Discontinued     1,000 mg 200 mL/hr over 60 Minutes Intravenous Every M-W-F (Hemodialysis)  05/26/15 1433 05/27/15 1351   05/26/15 1500  piperacillin-tazobactam (ZOSYN) IVPB 2.25 g     2.25 g 100 mL/hr over 30 Minutes Intravenous 3 times per day 05/26/15 1433     05/26/15 1445  vancomycin (VANCOCIN) 2,000 mg in sodium chloride 0.9 % 500 mL IVPB     2,000 mg 250 mL/hr over 120 Minutes Intravenous NOW 05/26/15 1433 05/26/15 1904   05/20/15 0600  ceFAZolin (ANCEF) IVPB 1 g/50 mL premix    Comments:  Send with pt to OR   1 g 100 mL/hr over 30 Minutes Intravenous To ShortStay Surgical 05/20/15 1011 05/21/15 0600   05/17/15 1530  [MAR Hold]  ceFAZolin (ANCEF) IVPB 2 g/50 mL premix     (MAR Hold since 05/21/15 0912)   2 g 100 mL/hr over 30 Minutes Intravenous  Once 05/17/15 1518 05/21/15 1059   05/17/15  1448  ceFAZolin (ANCEF) 2-3 GM-% IVPB SOLR    CommentsDesiree Hane   : cabinet override      05/17/15 1448 05/17/15 1519   05/14/15 1000  doxycycline (VIBRA-TABS) tablet 100 mg     100 mg Oral Every 12 hours 05/14/15 0116 05/19/15 2218   05/13/15 2145  azithromycin (ZITHROMAX) 500 mg in dextrose 5 % 250 mL IVPB     500 mg 250 mL/hr over 60 Minutes Intravenous  Once 05/13/15 2137 05/13/15 2258        Subjective:   Terry [name] was seen and examined today. Sitting up in the chair, feels difficult to lie down in the bed, cough is bothering him, had a rough night. Still very edematous. No fevers or chills.  Patient denies dizziness, abdominal pain, N/V/D/C, new weakness, numbess, tingling.    Objective:   Blood pressure 125/55, pulse 102, temperature 98 F (36.7 C), temperature source Oral, resp. rate 26, height 5\' 8"  (1.727 m), weight 92.3 kg (203 lb 7.8 oz), SpO2 98 %.  Wt Readings from Last 3 Encounters:  05/28/15 92.3 kg (203 lb 7.8 oz)  05/01/15 93.895 kg (207 lb)  03/20/15 94.348 kg (208 lb)     Intake/Output Summary (Last 24 hours) at 05/28/15 1300 Last data filed at 05/28/15 1058  Gross per 24 hour  Intake    300 ml  Output   2702 ml  Net  -2402 ml     Exam  General: Alert and oriented x 3, NAD  HEENT:  PERRLA, EOM  Neck: Supple, no JVD, no masses  CVS: S1 S2 clear, RRR  Respiratory: Bibasilar crackles  Abdomen: Soft, nontender, nondistended, + bowel sounds  Ext: no cyanosis clubbing, 3+ pitting edema  Neuro: no new deficits  Skin: No rashes  Psych: Normal affect and demeanor, alert and oriented x3    Data Review   Micro Results Recent Results (from the past 240 hour(s))  Surgical pcr screen     Status: Abnormal   Collection Time: 05/21/15  5:09 AM  Result Value Ref Range Status   MRSA, PCR POSITIVE (A) NEGATIVE Final   Staphylococcus aureus POSITIVE (A) NEGATIVE Final    Comment:        The Xpert SA Assay (FDA approved for NASAL specimens in patients over 35 years of age), is one component of a comprehensive surveillance program.  Test performance has been validated by [facility] for patients greater than or equal to 54 year old. It is not intended to diagnose infection nor to guide or monitor treatment.     Radiology Reports Dg Chest 2 View  05/26/2015  CLINICAL DATA:  History of pneumonia.  Shortness of breath. EXAM: CHEST  2 VIEW COMPARISON:  05/24/2015 FINDINGS: Large bore right internal jugular approach central venous catheter is stable. Cardiomediastinal silhouette is normal. Mediastinal contours appear intact. There is no evidence of pneumothorax. There is a right pleural effusion with linear airspace opacity in the right lower lobe likely representing atelectasis. The left costophrenic angle is incompletely visualized, and therefore small left pleural effusion cannot be excluded. Osseous structures are without acute abnormality. Soft tissues are grossly normal. IMPRESSION: Right pleural effusion with probably compressive atelectasis in the right lower lobe. Left costophrenic angle incompletely visualized, and therefore small left pleural effusion cannot be excluded. Electronically Signed   By:  Fidela Salisbury M.D.   On: 05/26/2015 11:09   Dg Chest 2 View  05/13/2015  CLINICAL DATA:  80 year old with 1  week history of productive cough and severe shortness of breath. Former smoker with current history of COPD. EXAM: CHEST  2 VIEW COMPARISON:  05/28/2014 and earlier. FINDINGS: AP erect and lateral imaging was performed. Cardiac silhouette mildly enlarged with a right coronary artery stent. Thoracic aorta atherosclerotic, unchanged. Prominent central pulmonary arteries, unchanged. Scarring in the lower lobes, right greater than left, unchanged. Hyperinflation, emphysematous changes in the upper lobes, and mild central peribronchial thickening, unchanged. No new pulmonary parenchymal abnormalities. Degenerative disc disease and spondylosis involving the thoracic spine. IMPRESSION: COPD/emphysema. Scarring in the lower lobes, right greater than left. No acute cardiopulmonary disease. Stable examination. Electronically Signed   By: Evangeline Dakin M.D.   On: 05/13/2015 21:03   US Renal  05/15/2015  CLINICAL DATA:  Acute renal injury EXAM: RENAL / URINARY TRACT ULTRASOUND COMPLETE COMPARISON:  10/24/2014 FINDINGS: Right Kidney: Length: 14.8 cm. Multiple cysts are noted. The largest of these measures 2.2 cm in greatest dimension. Left Kidney: Length: 14.0 cm. Multiple cysts are noted. The largest of these measures 3.1 cm in greatest dimension. Bladder: Appears normal for degree of bladder distention. IMPRESSION: Bilateral renal cysts similar to that seen on prior MRI examination. No acute obstructive changes are noted. Electronically Signed   By: Inez Catalina M.D.   On: 05/15/2015 16:29   Ir Fluoro Guide Cv Line Right  05/17/2015  INDICATION: Chronic kidney disease with early uremia. Need to start hemodialysis. EXAM: FLUOROSCOPIC AND ULTRASOUND GUIDED PLACEMENT OF A TUNNELED DIALYSIS CATHETER Physician: Stephan Minister. Henn, MD FLUOROSCOPY TIME:  42 seconds, 13.2 mGy MEDICATIONS: 2 g Ancef. As antibiotic  prophylaxis, Ancef was ordered pre-procedure and administered intravenously within one hour of incision. 0.5 mg versed, 25 mcg fentanyl. A radiology nurse monitored the patient for moderate sedation. ANESTHESIA/SEDATION: Moderate sedation time: 30 minutes PROCEDURE: Informed consent was obtained for placement of a tunneled dialysis catheter. The patient was placed supine on the interventional table. Ultrasound confirmed a patent right internal jugularvein. Ultrasound images were obtained for documentation. The right side of the neck was prepped and draped in a sterile fashion. The right side of the neck was anesthetized with 1% lidocaine. Maximal barrier sterile technique was utilized including caps, mask, sterile gowns, sterile gloves, sterile drape, hand hygiene and skin antiseptic. A small incision was made with #11 blade scalpel. A 21 gauge needle directed into the right internal jugular vein with ultrasound guidance. A micropuncture dilator set was placed. A 23 cm tip to cuff Palindrome catheter was selected. The skin below the right clavicle was anesthetized and a small incision was made with an #11 blade scalpel. A subcutaneous tunnel was formed to the vein dermatotomy site. The catheter was brought through the tunnel. The vein dermatotomy site was dilated to accommodate a peel-away sheath. The catheter was placed through the peel-away sheath and directed into the central venous structures. The tip of the catheter was placed at the superior cavoatrial junction with fluoroscopy. Fluoroscopic images were obtained for documentation. Both lumens were found to aspirate and flush well. The proper amount of heparin was flushed in both lumens. The vein dermatotomy site was closed using a single layer of absorbable suture and Dermabond. The catheter was secured to the skin using Prolene suture. FINDINGS: Catheter tip at the superior cavoatrial junction. Estimated blood loss: Minimal COMPLICATIONS: None IMPRESSION:  Successful placement of a right jugular tunneled dialysis catheter using ultrasound and fluoroscopic guidance. Electronically Signed   By: Markus Daft M.D.   On: 05/17/2015 16:23  Ir US Guide Vasc Access Right  05/17/2015  INDICATION: Chronic kidney disease with early uremia. Need to start hemodialysis. EXAM: FLUOROSCOPIC AND ULTRASOUND GUIDED PLACEMENT OF A TUNNELED DIALYSIS CATHETER Physician: Stephan Minister. Henn, MD FLUOROSCOPY TIME:  42 seconds, 13.2 mGy MEDICATIONS: 2 g Ancef. As antibiotic prophylaxis, Ancef was ordered pre-procedure and administered intravenously within one hour of incision. 0.5 mg versed, 25 mcg fentanyl. A radiology nurse monitored the patient for moderate sedation. ANESTHESIA/SEDATION: Moderate sedation time: 30 minutes PROCEDURE: Informed consent was obtained for placement of a tunneled dialysis catheter. The patient was placed supine on the interventional table. Ultrasound confirmed a patent right internal jugularvein. Ultrasound images were obtained for documentation. The right side of the neck was prepped and draped in a sterile fashion. The right side of the neck was anesthetized with 1% lidocaine. Maximal barrier sterile technique was utilized including caps, mask, sterile gowns, sterile gloves, sterile drape, hand hygiene and skin antiseptic. A small incision was made with #11 blade scalpel. A 21 gauge needle directed into the right internal jugular vein with ultrasound guidance. A micropuncture dilator set was placed. A 23 cm tip to cuff Palindrome catheter was selected. The skin below the right clavicle was anesthetized and a small incision was made with an #11 blade scalpel. A subcutaneous tunnel was formed to the vein dermatotomy site. The catheter was brought through the tunnel. The vein dermatotomy site was dilated to accommodate a peel-away sheath. The catheter was placed through the peel-away sheath and directed into the central venous structures. The tip of the catheter was  placed at the superior cavoatrial junction with fluoroscopy. Fluoroscopic images were obtained for documentation. Both lumens were found to aspirate and flush well. The proper amount of heparin was flushed in both lumens. The vein dermatotomy site was closed using a single layer of absorbable suture and Dermabond. The catheter was secured to the skin using Prolene suture. FINDINGS: Catheter tip at the superior cavoatrial junction. Estimated blood loss: Minimal COMPLICATIONS: None IMPRESSION: Successful placement of a right jugular tunneled dialysis catheter using ultrasound and fluoroscopic guidance. Electronically Signed   By: Markus Daft M.D.   On: 05/17/2015 16:23   Dg Chest Port 1 View  05/24/2015  CLINICAL DATA:  Shortness of breath. Finished hemodialysis 1 hour previously. EXAM: PORTABLE CHEST 1 VIEW COMPARISON:  05/13/2015. FINDINGS: Normal sized heart. Clear lungs. Interval right jugular catheter with its tip in the inferior aspect of the superior vena cava. No pneumothorax. Mild right basilar atelectasis and patchy opacity with mild progression. Clear left lung. Mild lower thoracic spine degenerative changes. IMPRESSION: Mild right basilar atelectasis and possible pneumonia. Electronically Signed   By: Claudie Revering M.D.   On: 05/24/2015 20:50    CBC  Recent Labs Lab 05/22/15 0700 05/24/15 1400 05/25/15 0529 05/27/15 1000 05/28/15 0513  WBC 19.9* 14.8* 12.6* 15.3* 15.0*  HGB 8.6* 8.7* 8.6* 8.3* 8.7*  HCT 26.2* 26.5* 28.0* 25.9* 27.1*  PLT 199 153 173 141* 159  MCV 90.7 94.0 97.2 94.9 93.8  MCH 29.8 30.9 29.9 30.4 30.1  MCHC 32.8 32.8 30.7 32.0 32.1  RDW 14.4 15.5 15.9* 16.3* 16.0*    Chemistries   Recent Labs Lab 05/22/15 0700 05/24/15 1400 05/25/15 1424 05/27/15 1000 05/28/15 1030  NA 132* 133* 132* 129* 133*  K 4.3 3.6 4.1 4.5 3.8  CL 95* 96* 97* 94* 95*  CO2 25 26 26 23 26   GLUCOSE 137* 154* 101* 190* 216*  BUN 81* 35* 34* 41* 36*  CREATININE 5.73* 4.00* 4.51* 5.88*  5.22*  CALCIUM 8.3* 8.1* 8.0* 8.1* 8.2*   ------------------------------------------------------------------------------------------------------------------ estimated creatinine clearance is 12.9 mL/min (by C-G formula based on Cr of 5.22). ------------------------------------------------------------------------------------------------------------------ No results for input(s): HGBA1C in the last 72 hours. ------------------------------------------------------------------------------------------------------------------ No results for input(s): CHOL, HDL, LDLCALC, TRIG, CHOLHDL, LDLDIRECT in the last 72 hours. ------------------------------------------------------------------------------------------------------------------ No results for input(s): TSH, T4TOTAL, T3FREE, THYROIDAB in the last 72 hours.  Invalid input(s): FREET3 ------------------------------------------------------------------------------------------------------------------  Recent Labs  05/28/15 1030  TIBC 157*  IRON 11*    Coagulation profile  Recent Labs Lab 05/24/15 0632 05/25/15 0529 05/26/15 0540 05/27/15 0539 05/28/15 0513  INR 1.17 1.37 1.44 2.51* 2.50*    No results for input(s): DDIMER in the last 72 hours.  Cardiac Enzymes No results for input(s): CKMB, TROPONINI, MYOGLOBIN in the last 168 hours.  Invalid input(s): CK ------------------------------------------------------------------------------------------------------------------ Invalid input(s): [place]  05/26/15 1719 05/26/15 2157 05/27/15 0740 05/27/15 1708 05/27/15 2135 05/28/15 0800  GLUCAP 147* 106* 110* 150* 176* 18*     RAI,RIPUDEEP M.D. [facility] 05/28/2015, 1:00 PM  Pager: [phone] Between 7am to 7pm - call Pager - [phone]  After 7pm go to [email or web address] - password [email or web address]  Call night coverage person covering after 7pm

## 2015-05-28 NOTE — Procedures (Signed)
I have personally attended this patient's dialysis session.   4K bath, UF goal 3 liters BP low 100's (increase midodrine to TID) Still w/marked edema  Jamal Maes, MD [place] Pager 05/28/2015, 11:14 AM

## 2015-05-29 DIAGNOSIS — N179 Acute kidney failure, unspecified: Secondary | ICD-10-CM

## 2015-05-29 DIAGNOSIS — I482 Chronic atrial fibrillation: Secondary | ICD-10-CM

## 2015-05-29 DIAGNOSIS — I5031 Acute diastolic (congestive) heart failure: Secondary | ICD-10-CM

## 2015-05-29 DIAGNOSIS — N189 Chronic kidney disease, unspecified: Secondary | ICD-10-CM

## 2015-05-29 DIAGNOSIS — J441 Chronic obstructive pulmonary disease with (acute) exacerbation: Principal | ICD-10-CM

## 2015-05-29 DIAGNOSIS — I110 Hypertensive heart disease with heart failure: Secondary | ICD-10-CM

## 2015-05-29 DIAGNOSIS — N186 End stage renal disease: Secondary | ICD-10-CM

## 2015-05-29 LAB — BASIC METABOLIC PANEL
ANION GAP: 13 (ref 5–15)
BUN: 26 mg/dL — ABNORMAL HIGH (ref 6–20)
CALCIUM: 8.6 mg/dL — AB (ref 8.9–10.3)
CO2: 28 mmol/L (ref 22–32)
Chloride: 93 mmol/L — ABNORMAL LOW (ref 101–111)
Creatinine, Ser: 4.43 mg/dL — ABNORMAL HIGH (ref 0.61–1.24)
GFR, EST AFRICAN AMERICAN: 13 mL/min — AB (ref >60)
GFR, EST NON AFRICAN AMERICAN: 12 mL/min — AB (ref >60)
Glucose, Bld: 122 mg/dL — ABNORMAL HIGH (ref 65–99)
Potassium: 4.6 mmol/L (ref 3.5–5.1)
SODIUM: 134 mmol/L — AB (ref 135–145)

## 2015-05-29 LAB — PROTIME-INR
INR: 3.1 — AB (ref 0.00–1.49)
PROTHROMBIN TIME: 31.4 s — AB (ref 11.6–15.2)

## 2015-05-29 LAB — GLUCOSE, CAPILLARY
Glucose-Capillary: 179 mg/dL — ABNORMAL HIGH (ref 65–99)
Glucose-Capillary: 79 mg/dL (ref 65–99)
Glucose-Capillary: 130 mg/dL — ABNORMAL HIGH (ref 65–99)

## 2015-05-29 MED ORDER — ALBUMIN HUMAN 25 % IV SOLN
INTRAVENOUS | Status: AC
  Administered 2015-05-29: 25 g via INTRAVENOUS
  Filled 2015-05-29: qty 100

## 2015-05-29 MED ORDER — MIDODRINE HCL 5 MG PO TABS
ORAL_TABLET | ORAL | Status: AC
  Administered 2015-05-29: 10 mg via ORAL
  Filled 2015-05-29: qty 2

## 2015-05-29 MED ORDER — DOXERCALCIFEROL 4 MCG/2ML IV SOLN
INTRAVENOUS | Status: AC
Start: 2015-05-29 — End: 2015-05-29
  Administered 2015-05-29: 1 ug via INTRAVENOUS
  Filled 2015-05-29: qty 2

## 2015-05-29 MED ORDER — ALBUMIN HUMAN 25 % IV SOLN
25.0000 g | Freq: Once | INTRAVENOUS | Status: AC
  Administered 2015-05-29: 25 g via INTRAVENOUS
  Filled 2015-05-29: qty 100

## 2015-05-29 MED ORDER — WARFARIN SODIUM 2 MG PO TABS
2.0000 mg | ORAL_TABLET | Freq: Once | ORAL | Status: AC
Start: 2015-05-29 — End: 2015-05-29
  Administered 2015-05-29: 2 mg via ORAL
  Filled 2015-05-29: qty 1

## 2015-05-29 MED ORDER — ALBUMIN HUMAN 25 % IV SOLN
25.0000 g | Freq: Once | INTRAVENOUS | Status: AC
Start: 2015-05-29 — End: 2015-05-29
  Administered 2015-05-29: 25 g via INTRAVENOUS

## 2015-05-29 NOTE — Progress Notes (Signed)
Pt arrived to unit per bed at 0930.  Report received from bedside RN, Lonn Georgia.  Consent verified.  A & O X 4  Lungs coarse to auscultation.  Tachycardic, Afib with RVR at times.  BLE +3 pitting edema noted.  RIJ accessed, both ports aspirated and flushed with NS.  Tx initiated at [id number] with goal of 3538mL and net fluid removal of 3L.  Will continue to monitor.

## 2015-05-29 NOTE — Procedures (Signed)
I have personally attended this patient's dialysis session.   He has not tolerated UF at all despite midodrine and albumin - BP as low as 60's TDC 400 K 4.7 change to 2K bath  Jamal Maes, MD [place] Pager 05/29/2015, 12:39 PM

## 2015-05-29 NOTE — Consult Note (Signed)
Reason for Consult: Atrial fibrillation Referring Physician:   DILLINGER ASTON is an 80 y.o. male.  HPI:   80 yo male with history of HTN, HLD, CVA, COPD, CAD, chronic kidney disease.  He was followed by cardiology in [place], [name] for many years until he moved to [place] in 2014. Cath records received from [facility] . Cardiac cath 11/30/1997. Mild distal Left main stenosis, 25% proximal LAD, minor irregularities Circumflex, 95% mid RCA stenosis treated with 3.5 x 32 mm bare metal stent. There was a ? Overlapping 3.5 x 18 mm bare metal stent more proximally. Echo 02/03/13 with LVEF=50-55%. He had normal carotid dopplers in [place] 2013. He was admitted to [facility] October 2015 with ruptured kidney cyst. Atrial fibrillation diagnosed in December 2015 at an office visit. He was started on Eliquis at that time. He is followed in Nephrology for stage 4 CKD ([facility]). He has had no stress testing in the last several years.   Patient was seen by Dr. [name] September 9 at which time he was having more dyspnea. Weight was stable.   He was admitted November 21 with cough and worsening shortness of breath, acute respiratory failure with hypoxia, acute diastolic heart failure, . On the 29th underwent a right brachiocephalic AV fistula creation.  He has been getting hemodialysis and net fluids are -21.9 L.   In a new 2-D echocardiogram on 05/16/2015 showing an ejection fraction of 55-60% with trivial MR and TR.  His atrial fibrillation appears to be chronic.  He is now on Coumadin.  The patient reports being able to walk to the bathroom 2 days ago however, yesterday he felt weaker and had to be taken of the bathroom in wheelchair. He feels his shortness of breath is improving.  Denies any pain, such as chest or abdominal. Is not appear to be orthopneic as he is laying quite flat in his bed currently.  He is coughing frequently and  seems to be productive. Continues to have considerable lower  extremity edema although his weight is down from 208 pounds back on November 26 to 191 yesterday and 194 today.  The patient currently denies nausea, vomiting, fever, PND, hematochezia, melena.    Past Medical History  Diagnosis Date  . Stroke ([place]) 2012  . High cholesterol   . Hypertension   . Kidney disease     CKD4, sees [place] [facility], considering dialysis  . COPD (chronic obstructive pulmonary disease) ([place])   . CAD (coronary artery disease) [place], [name]  . Acute and chronic respiratory failure 10/22/2012  . Osteoarthritis 10/22/2012  . Pneumonia April 2014  . Atrial fibrillation [facility])     Past Surgical History  Procedure Laterality Date  . Coronary stent placement  1999    2 stents  . Appendectomy  1984  . Colon surgery  2012    partial colon removed - twisted bowel  . Hernia repair      Right inguinal  . Cystoscopy/retrograde/ureteroscopy Bilateral 04/23/2014    Procedure: CYSTOSCOPY BILATERAL RETROGRADE,  LEFT URETEROSCOPY, LEFT STENT PLACEMENT;  Surgeon: Raynelle Bring, MD;  Location: [facility] ORS;  Service: Urology;  Laterality: Bilateral;  . Av fistula placement Right 05/21/2015    Procedure: Right Arm Brachiocephalic ARTERIOVENOUS (AV) FISTULA CREATION;  Surgeon: Angelia Mould, MD;  Location: [facility] OR;  Service: Vascular;  Laterality: Right;    Family History  Problem Relation Age of Onset  . Cancer Mother  Breast  . Heart disease Father   . Heart attack Father   . Parkinson's disease Brother     Social History:  reports that he quit smoking about 15 years ago. His smoking use included Cigarettes, Pipe, and Cigars. He has a 49 pack-year smoking history. He has never used smokeless tobacco. He reports that he does not drink alcohol or use illicit drugs.  Allergies:  Allergies  Allergen Reactions  . Yellow Dyes (Non-Tartrazine) Other (See Comments)    Burns arms Cough medications (tussionex and benzonatate ok)  . Levaquin [Levofloxacin In D5w]  Itching    Medications: Scheduled Meds: . atorvastatin  20 mg Oral q1800  . benzonatate  200 mg Oral TID  . budesonide-formoterol  2 puff Inhalation BID  . chlorpheniramine-HYDROcodone  5 mL Oral Q12H  . [START ON 05/31/2015] darbepoetin (ARANESP) injection - DIALYSIS  200 mcg Intravenous Q Fri-HD  . doxercalciferol  1 mcg Intravenous Q M,W,F-HD  . insulin aspart  0-15 Units Subcutaneous TID WC  . insulin aspart  0-5 Units Subcutaneous QHS  . ipratropium-albuterol  3 mL Nebulization TID  . magic mouthwash  5 mL Oral QID  . metoprolol  25 mg Oral BID  . midodrine  10 mg Oral TID WC  . piperacillin-tazobactam (ZOSYN)  IV  2.25 g Intravenous 3 times per day  . sodium chloride  500 mL Intravenous Once  . sodium chloride  3 mL Intravenous Q12H  . tiotropium  18 mcg Inhalation Daily  . [START ON 05/31/2015] vancomycin  1,000 mg Intravenous Q M,W,F-HD  . warfarin  2 mg Oral ONCE-1800  . Warfarin - Pharmacist Dosing Inpatient   Does not apply q1800   Continuous Infusions: . sodium chloride 10 mL/hr at 05/21/15 0946   PRN Meds:.albuterol, clonazePAM, HYDROcodone-acetaminophen, methocarbamol, morphine injection, ondansetron **OR** ondansetron (ZOFRAN) IV, oxyCODONE   Results for orders placed or performed during the hospital encounter of 05/13/15 (from the past 48 hour(s))  Glucose, capillary     Status: Abnormal   Collection Time: 05/27/15  5:08 PM  Result Value Ref Range   Glucose-Capillary 150 (H) 65 - 99 mg/dL  Glucose, capillary     Status: Abnormal   Collection Time: 05/27/15  9:35 PM  Result Value Ref Range   Glucose-Capillary 176 (H) 65 - 99 mg/dL   Comment 1 Notify RN    Comment 2 Document in Chart   Protime-INR     Status: Abnormal   Collection Time: 05/28/15  5:13 AM  Result Value Ref Range   Prothrombin Time 26.7 (H) 11.6 - 15.2 seconds   INR 2.50 (H) 0.00 - 1.49  CBC     Status: Abnormal   Collection Time: 05/28/15  5:13 AM  Result Value Ref Range   WBC 15.0 (H) 4.0  - 10.5 K/uL   RBC 2.89 (L) 4.22 - 5.81 MIL/uL   Hemoglobin 8.7 (L) 13.0 - 17.0 g/dL   HCT 27.1 (L) 39.0 - 52.0 %   MCV 93.8 78.0 - 100.0 fL   MCH 30.1 26.0 - 34.0 pg   MCHC 32.1 30.0 - 36.0 g/dL   RDW 16.0 (H) 11.5 - 15.5 %   Platelets 159 150 - 400 K/uL  Glucose, capillary     Status: Abnormal   Collection Time: 05/28/15  8:00 AM  Result Value Ref Range   Glucose-Capillary 124 (H) 65 - 99 mg/dL  Renal function panel     Status: Abnormal   Collection Time: 05/28/15 10:30 AM  Result  Value Ref Range   Sodium 133 (L) 135 - 145 mmol/L   Potassium 3.8 3.5 - 5.1 mmol/L   Chloride 95 (L) 101 - 111 mmol/L   CO2 26 22 - 32 mmol/L   Glucose, Bld 216 (H) 65 - 99 mg/dL   BUN 36 (H) 6 - 20 mg/dL   Creatinine, Ser 5.22 (H) 0.61 - 1.24 mg/dL   Calcium 8.2 (L) 8.9 - 10.3 mg/dL   Phosphorus 4.1 2.5 - 4.6 mg/dL   Albumin 2.5 (L) 3.5 - 5.0 g/dL   GFR calc non Af Amer 9 (L) >60 mL/min   GFR calc Af Amer 11 (L) >60 mL/min    Comment: (NOTE) The eGFR has been calculated using the CKD EPI equation. This calculation has not been validated in all clinical situations. eGFR's persistently <60 mL/min signify possible Chronic Kidney Disease.    Anion gap 12 5 - 15  Iron and TIBC     Status: Abnormal   Collection Time: 05/28/15 10:30 AM  Result Value Ref Range   Iron 11 (L) 45 - 182 ug/dL   TIBC 157 (L) 250 - 450 ug/dL   Saturation Ratios 7 (L) 17.9 - 39.5 %   UIBC 146 ug/dL  Glucose, capillary     Status: Abnormal   Collection Time: 05/28/15  4:53 PM  Result Value Ref Range   Glucose-Capillary 137 (H) 65 - 99 mg/dL  Glucose, capillary     Status: Abnormal   Collection Time: 05/28/15  9:48 PM  Result Value Ref Range   Glucose-Capillary 120 (H) 65 - 99 mg/dL  Protime-INR     Status: Abnormal   Collection Time: 05/29/15  7:08 AM  Result Value Ref Range   Prothrombin Time 31.4 (H) 11.6 - 15.2 seconds   INR 3.10 (H) 0.00 - 9.51  Basic metabolic panel     Status: Abnormal   Collection Time:  05/29/15  7:08 AM  Result Value Ref Range   Sodium 134 (L) 135 - 145 mmol/L   Potassium 4.6 3.5 - 5.1 mmol/L   Chloride 93 (L) 101 - 111 mmol/L   CO2 28 22 - 32 mmol/L   Glucose, Bld 122 (H) 65 - 99 mg/dL   BUN 26 (H) 6 - 20 mg/dL   Creatinine, Ser 4.43 (H) 0.61 - 1.24 mg/dL   Calcium 8.6 (L) 8.9 - 10.3 mg/dL   GFR calc non Af Amer 12 (L) >60 mL/min   GFR calc Af Amer 13 (L) >60 mL/min    Comment: (NOTE) The eGFR has been calculated using the CKD EPI equation. This calculation has not been validated in all clinical situations. eGFR's persistently <60 mL/min signify possible Chronic Kidney Disease.    Anion gap 13 5 - 15  Glucose, capillary     Status: Abnormal   Collection Time: 05/29/15  8:11 AM  Result Value Ref Range   Glucose-Capillary 179 (H) 65 - 99 mg/dL    No results found.  Review of Systems  HENT: Positive for congestion.   Respiratory: Positive for cough, sputum production and shortness of breath.   Cardiovascular: Positive for leg swelling. Negative for chest pain, palpitations and orthopnea.  Gastrointestinal: Negative for nausea, vomiting, abdominal pain, blood in stool and melena.  Musculoskeletal: Negative for myalgias.  Neurological: Positive for weakness (Yesterday). Negative for dizziness.  All other systems reviewed and are negative.  Blood pressure 104/55, pulse 107, temperature 97.6 F (36.4 C), temperature source Oral, resp. rate 24, height 5' 8"  (1.727  m), weight 194 lb 3.6 oz (88.1 kg), SpO2 94 %. Physical Exam  Constitutional: He is oriented to person, place, and time. He appears well-developed. No distress.  obese  HENT:  Head: Normocephalic and atraumatic.  Eyes: EOM are normal. Pupils are equal, round, and reactive to light. No scleral icterus.  Neck: Normal range of motion.  Cardiovascular: Normal rate.  An irregularly irregular rhythm present.  Pulses:      Radial pulses are 2+ on the right side, and 2+ on the left side.  Respiratory:  Effort normal.  Some Rhonchi  GI: Soft. Bowel sounds are normal. He exhibits distension. There is no tenderness.  Musculoskeletal: He exhibits edema.  3+ pitting LEE   Neurological: He is alert and oriented to person, place, and time.  Skin: Skin is warm and dry.  Psychiatric: He has a normal mood and affect.    Assessment/Plan:    COPD exacerbation (HCC)   CAD (coronary artery disease) of artery bypass graft  No complaints of chest pain   Essential hypertension  Currently hypotensive during dialysis.  Midrin given.    Acute diastolic CHF (congestive heart failure) (HCC) Net Fluids -21 L.  Yesterday at 3 L off today however there are only get off half a liter. Systolic blood pressure was as low as 60 during the session.  Further fluid removal per nephrology.  Keep legs elevated and either use compression hose or wrap with Ace bandage.      Chronic atrial fibrillation (HCC) Overall think his rate is controlled. Is currently on Lopressor 25 twice daily.  He is however having frequent hypotension particularly during dialysis. Consider switching metoprolol to Cardizem given he has a normal ejection fraction and diastolic dysfunction.  CHADSVASC 6.  Now on Coumadin   DM   ESRD (end stage renal disease) ([place])    Acute respiratory failure with hypoxia (HCC)   Acute renal failure superimposed on stage 4 chronic kidney disease ([place])   Shonique Pelphrey, [place], [place] 05/29/2015, 1:37 PM

## 2015-05-29 NOTE — Progress Notes (Signed)
Dr. Lorrene Reid notified of persistent hypotension.  Verbal confirmation received to give one more dose of 25g albumin.  If pressures do not respond, Dr. Lorrene Reid wants the pt rinsed back and tx ended.  Will continue to monitor.

## 2015-05-29 NOTE — Progress Notes (Signed)
[facility] Rounding Note  Subjective:  3rd HD in a row today (for volume) Not tolerating today d/t low BP into the 60's Says less SOB Sedated with Klonipin  Objective:  Intake/Output from previous day: 12/06 0701 - 12/07 0700 In: 500 [P.O.:450; IV Piggyback:50] Out: 3000  Intake/Output this shift: Total I/O In: 240 [P.O.:240] Out: -   Physical Examination BP 104/49 mmHg  Pulse 83  Temp(Src) 97.6 F (36.4 C) (Oral)  Resp 24  Ht 5\' 8"  (1.727 m)  Wt 88.1 kg (194 lb 3.6 oz)  BMI 29.54 kg/m2  SpO2 94% Weight trending:(Max weight this hosp 100.6 kg) 12/5 pre wt 90.6 Post wt 88.1 12/6 pre wt 92.3 Post wt 87.5 12/7 pre wt 88.1 Post wt   Disheveled in appearance Wearing O2 Very sleepy Coarse BS TDC running at 400 dry dressing R side Abdomen obese 3+ pitting edema bilateral LE's no real change  R AVF with hematoma at incision site, + bruit, Nice firm vein  Lab Results:  Recent Labs  05/27/15 1000 05/28/15 0513  WBC 15.3* 15.0*  HGB 8.3* 8.7*  HCT 25.9* 27.1*  PLT 141* 159     Recent Labs  05/28/15 1030 05/29/15 0708  NA 133* 134*  K 3.8 4.6  CL 95* 93*  CO2 26 28  GLUCOSE 216* 122*  BUN 36* 26*  CREATININE 5.22* 4.43*  CALCIUM 8.2* 8.6*    Scheduled Medications: . atorvastatin  20 mg Oral q1800  . benzonatate  200 mg Oral TID  . budesonide-formoterol  2 puff Inhalation BID  . chlorpheniramine-HYDROcodone  5 mL Oral Q12H  . [START ON 05/31/2015] darbepoetin (ARANESP) injection - DIALYSIS  200 mcg Intravenous Q Fri-HD  . doxercalciferol  1 mcg Intravenous Q M,W,F-HD  . insulin aspart  0-15 Units Subcutaneous TID WC  . insulin aspart  0-5 Units Subcutaneous QHS  . ipratropium-albuterol  3 mL Nebulization TID  . magic mouthwash  5 mL Oral QID  . metoprolol  25 mg Oral BID  . midodrine  10 mg Oral TID WC  . piperacillin-tazobactam (ZOSYN)  IV  2.25 g Intravenous 3 times per day  . sodium chloride  500 mL Intravenous Once  . sodium  chloride  3 mL Intravenous Q12H  . tiotropium  18 mcg Inhalation Daily  . [START ON 05/31/2015] vancomycin  1,000 mg Intravenous Q M,W,F-HD  . warfarin  2 mg Oral ONCE-1800  . Warfarin - Pharmacist Dosing Inpatient   Does not apply q1800    Background 80 with over 46yr HTN, DJD, severe COPD, CAD, prior ureteral stents, and known advancing late stage 4 CKD. Baseline Cr 3.9. Adm with cough, SOB, edema, worsening renal failure.  Dialysis initiated for worsening renal function/volume overload  ->new ESRD.  Assessment/Plan:  1. New start ESRD, dialysis dependent. 1st HD was 11/26. S/p AVF RUA Dr. Scot Dock 11/29. [facility] 11/25.   CLIPPED for [facility] MWF 2nd shift but not ready to go home yet. Requiring HD daily for volume overload. Today is 3rd day in a row. Is not tolerating HD today at all in terms of BP - has had midodrine, 2 doses alb.  Will not get UF goal today. Still has very marked LE edema.  2. Anemia on Darbe 200 QThurs Last received 12/2. Since MWF changed to QFriday 3. HPTH on vit D - on calcitriol 0.25/day. Last PTH 159 on 11/23. Will get Hectorol at [place] - changed to 1 mcg TIW on MWF.  4. COPD longstanding -  bronchodilators 5. Diastolic heart failure. LVEF 55-60.  6. AFib -- coumadin. Pharmacy dosing. Metoprolol for rate control - ? If amio could be used (so could stop metoprolol given major issues with BP on HD). I note cards has been called to see.  7. CAD 8. DJD 9. CHF/Vol overload - still LE edema 3+/CXR pleural effusions - dialysis/UF. 10. DM2 11. Cough with CXR 12/4 compressive atelectasis right lung/leukocytosis  - poss PNA - now on vanco and zosyn 12. MRSA PCR +  Jamal Maes, MD [facility] [phone] Pager 05/29/2015, 12:30 PM

## 2015-05-29 NOTE — Progress Notes (Signed)
ANTICOAGULATION  Antibiotic CONSULT NOTE - Follow Up Consult  Pharmacy Consult for Warfarin  / Vancomycin and Zosyn Indication: atrial fibrillation  Allergies  Allergen Reactions  . Yellow Dyes (Non-Tartrazine) Other (See Comments)    Burns arms Cough medications (tussionex and benzonatate ok)  . Levaquin [Levofloxacin In D5w] Itching    Patient Measurements: Height: 5\' 8"  (172.7 cm) Weight: 191 lb 12.8 oz (87 kg) IBW/kg (Calculated) : 68.4  Vital Signs: Temp: 97.9 F (36.6 C) (12/07 0551) Temp Source: Oral (12/07 0551) BP: 112/40 mmHg (12/07 0551) Pulse Rate: 103 (12/07 0551)  Labs:  Recent Labs  05/27/15 0539 05/27/15 1000 05/28/15 0513 05/28/15 1030 05/29/15 0708  HGB  --  8.3* 8.7*  --   --   HCT  --  25.9* 27.1*  --   --   PLT  --  141* 159  --   --   LABPROT 26.7*  --  26.7*  --  31.4*  INR 2.51*  --  2.50*  --  3.10*  CREATININE  --  5.88*  --  5.22* 4.43*    Estimated Creatinine Clearance: 14.7 mL/min (by C-G formula based on Cr of 4.43).   Assessment: 80 y/o male with CKD V who was on apixiban PTA for Afib who has now transitioned to ESRD and is HD dependent.  AC/heme: Lovenox for hx afib (PTA apixaban - changed d/t renal dysfxn, renal MD wanted lovenox) -  INR = 3.10 CBC stable, no bleeding noted. Initiated Coumadin 12/1. High fall risk. CHADS-VASc = 6  Vancomycin / Zosyn D# 3 for pneumonia, Receiving extra HD sessions, afebrile  Goal of Therapy:  INR 2-3 Anti-Xa level 0.6-1 units/ml 4hrs after LMWH dose given Monitor platelets by anticoagulation protocol: Yes   Appropriate antibiotic dosing   Plan:  - Coumadin 2 mg po x 1 tonight - Daily INR, CBC q72h  - Monitor for s/sx of bleeding - Continue Zosyn 2.25 grams iv Q 8 hours - Continue Vancomycin after HD  Thank you Anette Guarneri, PharmD [phone] 05/29/2015 8:51 AM

## 2015-05-29 NOTE — Progress Notes (Signed)
[facility]                                                                              Patient Demographics  Terry [name], is a 80 y.o. male, DOB - 16-Dec-1934, [id number]  Admit date - 05/13/2015   Admitting Physician Edwin Dada, MD  Outpatient Primary MD for the patient is [name], DO  LOS - 15   Chief Complaint  Patient presents with  . Cough  . Shortness of Breath       Brief HPI   80 year old male with a history of COPD, hypertension, CAD/CABG, CKD stage IV, atrial fibrillation diagnosed October 2015 on apixaban presented with one-week history of worsening cough and shortness of breath. The patient was seen by his primary care provider 4 days prior to admission and prescribed Augmentin and prednisone without improvement. He presented to the emergency department where he was noted to have oxygen saturation of 80% on room air and found to be in respiratory distress. The patient was started on doxycycline and Solu-Medrol. The patient has also been describing symptoms of orthopnea and PND with increasing abdominal girth and lower extremity edema for the past week prior to admission.  After admission, the patient was started on intravenous steroids. His breathing did not improve. He subsequently started on intravenous furosemide for volume excess. Ultimately, the patient was declared ESRD and dialysis catheter was placed and dialysis initiated on 05/17/2015.   Assessment & Plan    Principal Problem: Acute respiratory failure with hypoxia- states cough is improving - Secondary to CHF and COPD exacerbation, volume overload, O2 sats 96% on 2L  - continue Pulmonary hygiene and treatment for COPD/HD for volume control  - Repeat chest x-ray 12/4, shows pleural effusion with compressive right-sided atelectasis, however cough is worsening per patient with phlegm,hence placed on IV Zosyn and Vanc (history of +MRSA PCR). Per patient's wife, had a  history of severe pneumonia in the past. - Continue tesselon perles, tussinex (cough suppressant).  Active problems Acute diastolic CHF  - remains clinically volume overloaded, on back-to-back hemodialysis. Furosemide discontinued as patient is started on hemodialysis.  -2-D echo on 05/16/15 showed EF 55-60%, trivial MR/TR - Weight 208 pounds on 11/22> 198 pounds on 12/5, weight up due to volume overload. -Volume now managed with HD, low-sodium diet however BP also limiting factor   Acute on chronic renal failure (CKD 4-5), Now new on ESRD started on HD -Previous Baseline creatinine 3.2-3.4. Nephrology and vascular surgery was consulted. Patient underwent right AV fistula placement on 05/21/15. PermCath was placed by radiology on 05/17/15 -Patient started on hemodialysis, needs Klonopin 1 mg prior to HD for anxiety and cramping.  - Robaxin for cramping with HD  - CLIPPED with [facility] HD outpatient unit but not ready yet.  COPD exacerbation  -Patient has >60 pk years, completed steroid tapering, continue bronchodilators  Atrial fibrillation  -CHADS-VASc = 6 -Discontinued apixaban due to worsening renal function/new ESRD -continue lovenox and coumadin bridge-INR 1.44 -Patient is on metoprolol tartrate for rate control however this is also causing hypotension and limiting factor for hemodialysis. Called cardiology consult if  patient could be a potential candidate for amiodarone and discontinue metoprolol if possible.    hypotension with history of Hypertension -Continue metoprolol tartrate at adjusted dose for atrial fibrillation however requested cardiology consult -advise to follow low sodium diet -HD helping with volume and BP control  Diabetes mellitus type 2 -Checked hemoglobin A1c--6.6, pt agreeable to start po meds upon d/c (low dose glipizide or glyburide) -NovoLog sliding scale while inpatient   Coronary artery disease -Cardiac cath 11/30/1997. Mild distal  Left main stenosis, 25% proximal LAD, minor irregularities Circumflex, 95% mid RCA stenosis treated with 3.5 x 32 mm bare metal stent -no angina presently   Hyponatremia -Secondary to worsening renal function and fluid overload, improving with HD  Code Status: Full code  Family Communication: Discussed in detail with the patient, all imaging results, lab results explained to the patient. I also discussed in detail about the management and plan with patient's wife yesterday.    Disposition Plan:   Time Spent in minutes  25 minutes  Procedures  HD  Consults   Renal Cardiology  DVT Prophylaxis  Lovenox  Medications  Scheduled Meds: . atorvastatin  20 mg Oral q1800  . benzonatate  200 mg Oral TID  . budesonide-formoterol  2 puff Inhalation BID  . chlorpheniramine-HYDROcodone  5 mL Oral Q12H  . [START ON 05/31/2015] darbepoetin (ARANESP) injection - DIALYSIS  200 mcg Intravenous Q Fri-HD  . doxercalciferol  1 mcg Intravenous Q M,W,F-HD  . insulin aspart  0-15 Units Subcutaneous TID WC  . insulin aspart  0-5 Units Subcutaneous QHS  . ipratropium-albuterol  3 mL Nebulization TID  . magic mouthwash  5 mL Oral QID  . metoprolol  25 mg Oral BID  . midodrine  10 mg Oral TID WC  . piperacillin-tazobactam (ZOSYN)  IV  2.25 g Intravenous 3 times per day  . sodium chloride  500 mL Intravenous Once  . sodium chloride  3 mL Intravenous Q12H  . tiotropium  18 mcg Inhalation Daily  . [START ON 05/31/2015] vancomycin  1,000 mg Intravenous Q M,W,F-HD  . warfarin  2 mg Oral ONCE-1800  . Warfarin - Pharmacist Dosing Inpatient   Does not apply q1800   Continuous Infusions: . sodium chloride 10 mL/hr at 05/21/15 0946   PRN Meds:.albuterol, clonazePAM, HYDROcodone-acetaminophen, methocarbamol, morphine injection, ondansetron **OR** ondansetron (ZOFRAN) IV, oxyCODONE   Antibiotics   Anti-infectives    Start     Dose/Rate Route Frequency Ordered Stop   05/31/15 1200  vancomycin (VANCOCIN)  IVPB 1000 mg/200 mL premix     1,000 mg 200 mL/hr over 60 Minutes Intravenous Every M-W-F (Hemodialysis) 05/27/15 1351     05/27/15 1200  vancomycin (VANCOCIN) IVPB 1000 mg/200 mL premix  Status:  Discontinued     1,000 mg 200 mL/hr over 60 Minutes Intravenous Every M-W-F (Hemodialysis) 05/26/15 1433 05/27/15 1351   05/26/15 1500  piperacillin-tazobactam (ZOSYN) IVPB 2.25 g     2.25 g 100 mL/hr over 30 Minutes Intravenous 3 times per day 05/26/15 1433     05/26/15 1445  vancomycin (VANCOCIN) 2,000 mg in sodium chloride 0.9 % 500 mL IVPB     2,000 mg 250 mL/hr over 120 Minutes Intravenous NOW 05/26/15 1433 05/26/15 1904   05/20/15 0600  ceFAZolin (ANCEF) IVPB 1 g/50 mL premix    Comments:  Send with pt to OR   1 g 100 mL/hr over 30 Minutes Intravenous To [facility] 05/20/15 1011 05/21/15 0600   05/17/15 1530  [MAR Hold]  ceFAZolin (ANCEF) IVPB 2 g/50 mL premix     (MAR Hold since 05/21/15 0912)   2 g 100 mL/hr over 30 Minutes Intravenous  Once 05/17/15 1518 05/21/15 1059   05/17/15 1448  ceFAZolin (ANCEF) 2-3 GM-% IVPB SOLR    Comments:  Desiree Hane   : cabinet override      05/17/15 1448 05/17/15 1519   05/14/15 1000  doxycycline (VIBRA-TABS) tablet 100 mg     100 mg Oral Every 12 hours 05/14/15 0116 05/19/15 2218   05/13/15 2145  azithromycin (ZITHROMAX) 500 mg in dextrose 5 % 250 mL IVPB     500 mg 250 mL/hr over 60 Minutes Intravenous  Once 05/13/15 2137 05/13/15 2258        Subjective:   Terry [name] was seen and examined today. Sitting up in the chair,states cough is better. Still very edematous. No fevers or chills.  Patient denies dizziness, abdominal pain, N/V/D/C, new weakness, numbess, tingling.    Objective:   Blood pressure 104/49, pulse 83, temperature 97.6 F (36.4 C), temperature source Oral, resp. rate 24, height 5\' 8"  (1.727 m), weight 88.1 kg (194 lb 3.6 oz), SpO2 94 %.  Wt Readings from Last 3 Encounters:  05/29/15 88.1 kg (194 lb 3.6 oz)    05/01/15 93.895 kg (207 lb)  03/20/15 94.348 kg (208 lb)     Intake/Output Summary (Last 24 hours) at 05/29/15 1220 Last data filed at 05/29/15 [id number]  Gross per 24 hour  Intake    530 ml  Output   3000 ml  Net  -2470 ml    Exam  General: Alert and oriented x 3, NAD  HEENT:  PERRLA, EOM  Neck: Supple, no JVD, no masses  CVS: S1 S2 clear, RRR  Respiratory: Bibasilar crackles  Abdomen: Soft, nontender, nondistended, + bowel sounds  Ext: no cyanosis clubbing, 3+ pitting edema  Neuro: no new deficits  Skin: No rashes  Psych: Normal affect and demeanor, alert and oriented x3    Data Review   Micro Results Recent Results (from the past 240 hour(s))  Surgical pcr screen     Status: Abnormal   Collection Time: 05/21/15  5:09 AM  Result Value Ref Range Status   MRSA, PCR POSITIVE (A) NEGATIVE Final   Staphylococcus aureus POSITIVE (A) NEGATIVE Final    Comment:        The Xpert SA Assay (FDA approved for NASAL specimens in patients over 6 years of age), is one component of a comprehensive surveillance program.  Test performance has been validated by [facility] for patients greater than or equal to 66 year old. It is not intended to diagnose infection nor to guide or monitor treatment.     Radiology Reports Dg Chest 2 View  05/26/2015  CLINICAL DATA:  History of pneumonia.  Shortness of breath. EXAM: CHEST  2 VIEW COMPARISON:  05/24/2015 FINDINGS: Large bore right internal jugular approach central venous catheter is stable. Cardiomediastinal silhouette is normal. Mediastinal contours appear intact. There is no evidence of pneumothorax. There is a right pleural effusion with linear airspace opacity in the right lower lobe likely representing atelectasis. The left costophrenic angle is incompletely visualized, and therefore small left pleural effusion cannot be excluded. Osseous structures are without acute abnormality. Soft tissues are grossly normal. IMPRESSION:  Right pleural effusion with probably compressive atelectasis in the right lower lobe. Left costophrenic angle incompletely visualized, and therefore small left pleural effusion cannot be excluded. Electronically Signed   By: Thomas Hoff  Dimitrova M.D.   On: 05/26/2015 11:09   Dg Chest 2 View  05/13/2015  CLINICAL DATA:  80 year old with 1 week history of productive cough and severe shortness of breath. Former smoker with current history of COPD. EXAM: CHEST  2 VIEW COMPARISON:  05/28/2014 and earlier. FINDINGS: AP erect and lateral imaging was performed. Cardiac silhouette mildly enlarged with a right coronary artery stent. Thoracic aorta atherosclerotic, unchanged. Prominent central pulmonary arteries, unchanged. Scarring in the lower lobes, right greater than left, unchanged. Hyperinflation, emphysematous changes in the upper lobes, and mild central peribronchial thickening, unchanged. No new pulmonary parenchymal abnormalities. Degenerative disc disease and spondylosis involving the thoracic spine. IMPRESSION: COPD/emphysema. Scarring in the lower lobes, right greater than left. No acute cardiopulmonary disease. Stable examination. Electronically Signed   By: Evangeline Dakin M.D.   On: 05/13/2015 21:03   US Renal  05/15/2015  CLINICAL DATA:  Acute renal injury EXAM: RENAL / URINARY TRACT ULTRASOUND COMPLETE COMPARISON:  10/24/2014 FINDINGS: Right Kidney: Length: 14.8 cm. Multiple cysts are noted. The largest of these measures 2.2 cm in greatest dimension. Left Kidney: Length: 14.0 cm. Multiple cysts are noted. The largest of these measures 3.1 cm in greatest dimension. Bladder: Appears normal for degree of bladder distention. IMPRESSION: Bilateral renal cysts similar to that seen on prior MRI examination. No acute obstructive changes are noted. Electronically Signed   By: Inez Catalina M.D.   On: 05/15/2015 16:29   Ir Fluoro Guide Cv Line Right  05/17/2015  INDICATION: Chronic kidney disease with early  uremia. Need to start hemodialysis. EXAM: FLUOROSCOPIC AND ULTRASOUND GUIDED PLACEMENT OF A TUNNELED DIALYSIS CATHETER Physician: Stephan Minister. Henn, MD FLUOROSCOPY TIME:  42 seconds, 13.2 mGy MEDICATIONS: 2 g Ancef. As antibiotic prophylaxis, Ancef was ordered pre-procedure and administered intravenously within one hour of incision. 0.5 mg versed, 25 mcg fentanyl. A radiology nurse monitored the patient for moderate sedation. ANESTHESIA/SEDATION: Moderate sedation time: 30 minutes PROCEDURE: Informed consent was obtained for placement of a tunneled dialysis catheter. The patient was placed supine on the interventional table. Ultrasound confirmed a patent right internal jugularvein. Ultrasound images were obtained for documentation. The right side of the neck was prepped and draped in a sterile fashion. The right side of the neck was anesthetized with 1% lidocaine. Maximal barrier sterile technique was utilized including caps, mask, sterile gowns, sterile gloves, sterile drape, hand hygiene and skin antiseptic. A small incision was made with #11 blade scalpel. A 21 gauge needle directed into the right internal jugular vein with ultrasound guidance. A micropuncture dilator set was placed. A 23 cm tip to cuff Palindrome catheter was selected. The skin below the right clavicle was anesthetized and a small incision was made with an #11 blade scalpel. A subcutaneous tunnel was formed to the vein dermatotomy site. The catheter was brought through the tunnel. The vein dermatotomy site was dilated to accommodate a peel-away sheath. The catheter was placed through the peel-away sheath and directed into the central venous structures. The tip of the catheter was placed at the superior cavoatrial junction with fluoroscopy. Fluoroscopic images were obtained for documentation. Both lumens were found to aspirate and flush well. The proper amount of heparin was flushed in both lumens. The vein dermatotomy site was closed using a single  layer of absorbable suture and Dermabond. The catheter was secured to the skin using Prolene suture. FINDINGS: Catheter tip at the superior cavoatrial junction. Estimated blood loss: Minimal COMPLICATIONS: None IMPRESSION: Successful placement of a right jugular tunneled dialysis  catheter using ultrasound and fluoroscopic guidance. Electronically Signed   By: Markus Daft M.D.   On: 05/17/2015 16:23   Ir US Guide Vasc Access Right  05/17/2015  INDICATION: Chronic kidney disease with early uremia. Need to start hemodialysis. EXAM: FLUOROSCOPIC AND ULTRASOUND GUIDED PLACEMENT OF A TUNNELED DIALYSIS CATHETER Physician: Stephan Minister. Henn, MD FLUOROSCOPY TIME:  42 seconds, 13.2 mGy MEDICATIONS: 2 g Ancef. As antibiotic prophylaxis, Ancef was ordered pre-procedure and administered intravenously within one hour of incision. 0.5 mg versed, 25 mcg fentanyl. A radiology nurse monitored the patient for moderate sedation. ANESTHESIA/SEDATION: Moderate sedation time: 30 minutes PROCEDURE: Informed consent was obtained for placement of a tunneled dialysis catheter. The patient was placed supine on the interventional table. Ultrasound confirmed a patent right internal jugularvein. Ultrasound images were obtained for documentation. The right side of the neck was prepped and draped in a sterile fashion. The right side of the neck was anesthetized with 1% lidocaine. Maximal barrier sterile technique was utilized including caps, mask, sterile gowns, sterile gloves, sterile drape, hand hygiene and skin antiseptic. A small incision was made with #11 blade scalpel. A 21 gauge needle directed into the right internal jugular vein with ultrasound guidance. A micropuncture dilator set was placed. A 23 cm tip to cuff Palindrome catheter was selected. The skin below the right clavicle was anesthetized and a small incision was made with an #11 blade scalpel. A subcutaneous tunnel was formed to the vein dermatotomy site. The catheter was brought  through the tunnel. The vein dermatotomy site was dilated to accommodate a peel-away sheath. The catheter was placed through the peel-away sheath and directed into the central venous structures. The tip of the catheter was placed at the superior cavoatrial junction with fluoroscopy. Fluoroscopic images were obtained for documentation. Both lumens were found to aspirate and flush well. The proper amount of heparin was flushed in both lumens. The vein dermatotomy site was closed using a single layer of absorbable suture and Dermabond. The catheter was secured to the skin using Prolene suture. FINDINGS: Catheter tip at the superior cavoatrial junction. Estimated blood loss: Minimal COMPLICATIONS: None IMPRESSION: Successful placement of a right jugular tunneled dialysis catheter using ultrasound and fluoroscopic guidance. Electronically Signed   By: Markus Daft M.D.   On: 05/17/2015 16:23   Dg Chest Port 1 View  05/24/2015  CLINICAL DATA:  Shortness of breath. Finished hemodialysis 1 hour previously. EXAM: PORTABLE CHEST 1 VIEW COMPARISON:  05/13/2015. FINDINGS: Normal sized heart. Clear lungs. Interval right jugular catheter with its tip in the inferior aspect of the superior vena cava. No pneumothorax. Mild right basilar atelectasis and patchy opacity with mild progression. Clear left lung. Mild lower thoracic spine degenerative changes. IMPRESSION: Mild right basilar atelectasis and possible pneumonia. Electronically Signed   By: Claudie Revering M.D.   On: 05/24/2015 20:50    CBC  Recent Labs Lab 05/24/15 1400 05/25/15 0529 05/27/15 1000 05/28/15 0513  WBC 14.8* 12.6* 15.3* 15.0*  HGB 8.7* 8.6* 8.3* 8.7*  HCT 26.5* 28.0* 25.9* 27.1*  PLT 153 173 141* 159  MCV 94.0 97.2 94.9 93.8  MCH 30.9 29.9 30.4 30.1  MCHC 32.8 30.7 32.0 32.1  RDW 15.5 15.9* 16.3* 16.0*    Chemistries   Recent Labs Lab 05/24/15 1400 05/25/15 1424 05/27/15 1000 05/28/15 1030 05/29/15 0708  NA 133* 132* 129* 133* 134*    K 3.6 4.1 4.5 3.8 4.6  CL 96* 97* 94* 95* 93*  CO2 26 26 23 26  28  GLUCOSE 154* 101* 190* 216* 122*  BUN 35* 34* 41* 36* 26*  CREATININE 4.00* 4.51* 5.88* 5.22* 4.43*  CALCIUM 8.1* 8.0* 8.1* 8.2* 8.6*   ------------------------------------------------------------------------------------------------------------------ estimated creatinine clearance is 14.8 mL/min (by C-G formula based on Cr of 4.43). ------------------------------------------------------------------------------------------------------------------ No results for input(s): HGBA1C in the last 72 hours. ------------------------------------------------------------------------------------------------------------------ No results for input(s): CHOL, HDL, LDLCALC, TRIG, CHOLHDL, LDLDIRECT in the last 72 hours. ------------------------------------------------------------------------------------------------------------------ No results for input(s): TSH, T4TOTAL, T3FREE, THYROIDAB in the last 72 hours.  Invalid input(s): FREET3 ------------------------------------------------------------------------------------------------------------------  Recent Labs  05/28/15 1030  TIBC 157*  IRON 11*    Coagulation profile  Recent Labs Lab 05/25/15 0529 05/26/15 0540 05/27/15 0539 05/28/15 0513 05/29/15 0708  INR 1.37 1.44 2.51* 2.50* 3.10*    No results for input(s): DDIMER in the last 72 hours.  Cardiac Enzymes No results for input(s): CKMB, TROPONINI, MYOGLOBIN in the last 168 hours.  Invalid input(s): CK ------------------------------------------------------------------------------------------------------------------ Invalid input(s): [place]  05/27/15 1708 05/27/15 2135 05/28/15 0800 05/28/15 1653 05/28/15 2148 05/29/15 0811  GLUCAP 150* 176* 124* 137* 120* 179*     RAI,RIPUDEEP M.D. [facility] 05/29/2015, 12:20 PM  Pager: [phone] Between 7am to 7pm - call Pager -  [phone]  After 7pm go to [email or web address] - password [email or web address]  Call night coverage person covering after 7pm

## 2015-05-29 NOTE — Progress Notes (Signed)
Pt hypotensive with systolic in [id number].  This RN conversed with Dr. Lorrene Reid on 12/6 about Terry [name] and received verbal orders to infuse albumin as needed for hypotension.  Will continue to monitor.

## 2015-05-29 NOTE — Progress Notes (Signed)
Tx complete.  Pt tolerated tx poorly, hypotensive entire tx despite total of 75g of albumin.  Report called to bedside RN.  Time on dialysis unit extended due to clotting of system.  Machine relined and tx continued with 15 minute pause.    Post tx assessment unchanged.  Goal of 1730mL with net of 591mL ultrafiltrated.

## 2015-05-30 LAB — BASIC METABOLIC PANEL
Anion gap: 11 (ref 5–15)
BUN: 18 mg/dL (ref 6–20)
CHLORIDE: 92 mmol/L — AB (ref 101–111)
CO2: 29 mmol/L (ref 22–32)
CREATININE: 3.94 mg/dL — AB (ref 0.61–1.24)
Calcium: 8.7 mg/dL — ABNORMAL LOW (ref 8.9–10.3)
GFR calc Af Amer: 15 mL/min — ABNORMAL LOW (ref >60)
GFR calc non Af Amer: 13 mL/min — ABNORMAL LOW (ref >60)
GLUCOSE: 90 mg/dL (ref 65–99)
Potassium: 4.6 mmol/L (ref 3.5–5.1)
Sodium: 132 mmol/L — ABNORMAL LOW (ref 135–145)

## 2015-05-30 LAB — GLUCOSE, CAPILLARY
GLUCOSE-CAPILLARY: 166 mg/dL — AB (ref 65–99)
Glucose-Capillary: 93 mg/dL (ref 65–99)
Glucose-Capillary: 133 mg/dL — ABNORMAL HIGH (ref 65–99)
Glucose-Capillary: 108 mg/dL — ABNORMAL HIGH (ref 65–99)

## 2015-05-30 LAB — PROTIME-INR
INR: 3.46 — ABNORMAL HIGH (ref 0.00–1.49)
Prothrombin Time: 34.1 seconds — ABNORMAL HIGH (ref 11.6–15.2)

## 2015-05-30 LAB — VANCOMYCIN, RANDOM: Vancomycin Rm: 27 ug/mL

## 2015-05-30 NOTE — Evaluation (Signed)
Physical Therapy Evaluation Patient Details Name: Terry [name] MRN: [id number] DOB: Dec 11, 1934 Today's Date: 05/30/2015   History of Present Illness  Pt is a 80 y/o M who presented w/ worsening cough and SOB dx w/ COPD exacerbation.  Pt's PMH includes stroke, CAD, a-fib.   Clinical Impression  Pt admitted with above diagnosis. Pt currently with functional limitations due to the deficits listed below (see PT Problem List). Pt found sitting on toilet in bathroom, very SOB and SpO2 reading 57% on 4 LPM O2 on PT's portable pulse oximeter.  Pt instructed on proper breathing technique and O2 increased to 8 LPM, pt able to eventually attain SpO2 of 95% after ~5 minutes.  SpO2 at 91% on 4 LPM at end of session sitting in chair.  Mod A for sit<>stand transfer, pt very unstable and poor awareness of safety.  Will need SNF at d/c as his wife works during the day.  Pt will benefit from skilled PT to increase their independence and safety with mobility to allow discharge to the venue listed below.      Follow Up Recommendations SNF;Supervision/Assistance - 24 hour    Equipment Recommendations  Other (comment) (TBD at next venue of care)    Recommendations for Other Services       Precautions / Restrictions Precautions Precautions: Fall Precaution Comments: monitor O2 Restrictions Weight Bearing Restrictions: No      Mobility  Bed Mobility               General bed mobility comments: Pt on toilet in bathroom upon PT arrival  Transfers Overall transfer level: Needs assistance Equipment used: Rolling walker (2 wheeled) Transfers: Sit to/from Stand Sit to Stand: Mod assist         General transfer comment: A to boost from toilet and cues for hand placment using railing.  Required mod A for safe descent to recliner chair as he begins saying, "My legs are going to give out" as he gets close to the chair.    Ambulation/Gait Ambulation/Gait assistance: Min assist Ambulation Distance  (Feet): 20 Feet Assistive device: Rolling walker (2 wheeled) Gait Pattern/deviations: Step-through pattern;Staggering left;Staggering right;Decreased stride length;Antalgic;Trunk flexed   Gait velocity interpretation: Below normal speed for age/gender General Gait Details: Pt staggering Lt and Rt and requires A to stabilize.  SpO2 above 90% while ambulating in room.  Stairs            Wheelchair Mobility    Modified Rankin (Stroke Patients Only)       Balance Overall balance assessment: Needs assistance Sitting-balance support: Feet supported;Bilateral upper extremity supported Sitting balance-Leahy Scale: Fair Sitting balance - Comments: Pt very lethargic at end of session, min guard provided for pt's safety   Standing balance support: Bilateral upper extremity supported;During functional activity Standing balance-Leahy Scale: Poor Standing balance comment: Relies on RW and A for support                             Pertinent Vitals/Pain Pain Assessment: No/denies pain    Home Living Family/patient expects to be discharged to:: Skilled nursing facility Living Arrangements: Spouse/significant other                    Prior Function Level of Independence: Independent         Comments: per PT, PTA pt not using AD and was Ind w/ all ADLs     Hand Dominance  Extremity/Trunk Assessment   Upper Extremity Assessment: Generalized weakness           Lower Extremity Assessment: Generalized weakness         Communication   Communication: No difficulties  Cognition Arousal/Alertness: Lethargic Behavior During Therapy: Impulsive;Anxious Overall Cognitive Status: No family/caregiver present to determine baseline cognitive functioning Area of Impairment: Orientation;Memory;Safety/judgement;Problem solving Orientation Level: Disoriented to;Situation   Memory: Decreased short-term memory   Safety/Judgement: Decreased awareness of  safety;Decreased awareness of deficits   Problem Solving: Slow processing;Difficulty sequencing;Requires verbal cues;Requires tactile cues General Comments: At end of session pt says, "you need to clean that room", he explains that he is not talking about the bathroom but "another room"     General Comments General comments (skin integrity, edema, etc.): Pt found sitting on toilet in bathroom, very SOB and SpO2 reading 57% on 4 LPM O2 on PT's portable pulse oximeter.  Pt instructed on proper breathing technique and O2 increased to 8 LPM, pt able to eventually attain SpO2 of 95% after ~5 minutes.  SpO2 at 91% on 4 LPM at end of session sitting in chair.    Exercises General Exercises - Lower Extremity Ankle Circles/Pumps: AROM;Both;10 reps;Seated      Assessment/Plan    PT Assessment Patient needs continued PT services  PT Diagnosis Difficulty walking;Generalized weakness   PT Problem List Decreased strength;Decreased activity tolerance;Decreased balance;Decreased mobility;Decreased cognition;Decreased knowledge of use of DME;Decreased safety awareness;Decreased knowledge of precautions;Cardiopulmonary status limiting activity  PT Treatment Interventions DME instruction;Gait training;Stair training;Functional mobility training;Therapeutic activities;Therapeutic exercise;Balance training;Neuromuscular re-education;Cognitive remediation;Patient/family education   PT Goals (Current goals can be found in the Care Plan section) Acute Rehab PT Goals Patient Stated Goal: to rest PT Goal Formulation: With patient Time For Goal Achievement: 06/13/15 Potential to Achieve Goals: Good    Frequency Min 2X/week   Barriers to discharge Inaccessible home environment;Decreased caregiver support steps to enter home and intermittent assist from wife who works during the day    Co-evaluation               End of [place] During Treatment: Gait belt;Oxygen Activity Tolerance:  Treatment limited secondary to medical complications (Comment) (low SpO2 levels) Patient left: in chair;with call bell/phone within reach;with chair alarm set Nurse Communication: Mobility status;Other (comment) (SpO2 levels and pt's cognition)         Time: [id number] PT Time Calculation (min) (ACUTE ONLY): 23 min   Charges:   PT Evaluation $Initial PT Evaluation Tier I: 1 Procedure PT Treatments $Therapeutic Exercise: 8-22 mins   PT G Codes:       Joslyn Hy PT, DPT [phone] Pager: [phone] 05/30/2015, 4:27 PM

## 2015-05-30 NOTE — Progress Notes (Signed)
ANTICOAGULATION  Antibiotic CONSULT NOTE - Follow Up Consult  Pharmacy Consult for Warfarin  / Vancomycin and Zosyn Indication: atrial fibrillation / Pneumonia  Allergies  Allergen Reactions  . Yellow Dyes (Non-Tartrazine) Other (See Comments)    Burns arms Cough medications (tussionex and benzonatate ok)  . Levaquin [Levofloxacin In D5w] Itching    Patient Measurements: Height: 5\' 8"  (172.7 cm) Weight: 194 lb 10.7 oz (88.3 kg) IBW/kg (Calculated) : 68.4  Vital Signs: Temp: 98.5 F (36.9 C) (12/08 0532) Temp Source: Oral (12/08 0532) BP: 131/53 mmHg (12/08 0532) Pulse Rate: 117 (12/08 0532)  Labs:  Recent Labs  05/28/15 0513 05/28/15 1030 05/29/15 0708 05/30/15 0720  HGB 8.7*  --   --   --   HCT 27.1*  --   --   --   PLT 159  --   --   --   LABPROT 26.7*  --  31.4* 34.1*  INR 2.50*  --  3.10* 3.46*  CREATININE  --  5.22* 4.43* 3.94*    Estimated Creatinine Clearance: 16.7 mL/min (by C-G formula based on Cr of 3.94).   Assessment: 80 y/o male with CKD V who was on apixiban PTA for Afib who has now transitioned to ESRD and is HD dependent.  AC/heme: Lovenox for hx afib (PTA apixaban - changed d/t renal dysfxn, renal MD wanted lovenox) -  INR = 3.46 CBC stable, no bleeding noted. Initiated Coumadin 12/1. High fall risk. CHADS-VASc = 6  Vancomycin / Zosyn D#4-5  for pneumonia, Receiving extra HD sessions, afebrile Vancomycin random level this AM = 27, therapeutic  Goal of Therapy:  INR 2-3 Anti-Xa level 0.6-1 units/ml 4hrs after LMWH dose given Monitor platelets by anticoagulation protocol: Yes   Appropriate antibiotic dosing   Plan:  - No Coumadin today - Daily INR, CBC q72h  - Monitor for s/sx of bleeding - Continue Zosyn 2.25 grams iv Q 8 hours - Continue Vancomycin after HD - next dose likely Friday   Thank you Anette Guarneri, PharmD [phone] 05/30/2015 10:58 AM

## 2015-05-30 NOTE — Progress Notes (Signed)
[facility] Rounding Note  Subjective:  3rd HD in a row yesterday (for volume) and did not tolerate fluid removal. Cards has seen and amio not option for AF b/c not paroxysmal so stuck with BB and midodrine He actually feels better today - cough is getting some better and got more sleep last night. Sitting up on the edge of the bed and actually smiled a little bit  Objective:  Physical Examination BP 131/53 mmHg  Pulse 117  Temp(Src) 98.5 F (36.9 C) (Oral)  Resp 20  Ht 5\' 8"  (1.727 m)  Wt 88.3 kg (194 lb 10.7 oz)  BMI 29.61 kg/m2  SpO2 91% Weight trending:(Max weight this hosp 100.6 kg) 12/5 pre wt 90.6 Post wt 88.1 12/6 pre wt 92.3 Post wt 87.5 12/7 pre wt 88.1 Post wt 88  Disheveled in appearance but smiling and sitting on edge of bed Wearing O2 Coarse BS TDC dressing intact Abdomen obese 2+ pitting edema bilateral LE's R AVF with hematoma at incision site, + bruit, Nice firm vein  Lab Results:  Recent Labs  05/28/15 0513  WBC 15.0*  HGB 8.7*  HCT 27.1*  PLT 159     Recent Labs  05/29/15 0708 05/30/15 0720  NA 134* 132*  K 4.6 4.6  CL 93* 92*  CO2 28 29  GLUCOSE 122* 90  BUN 26* 18  CREATININE 4.43* 3.94*  CALCIUM 8.6* 8.7*    Scheduled Medications: . atorvastatin  20 mg Oral q1800  . benzonatate  200 mg Oral TID  . budesonide-formoterol  2 puff Inhalation BID  . chlorpheniramine-HYDROcodone  5 mL Oral Q12H  . [START ON 05/31/2015] darbepoetin (ARANESP) injection - DIALYSIS  200 mcg Intravenous Q Fri-HD  . doxercalciferol  1 mcg Intravenous Q M,W,F-HD  . insulin aspart  0-15 Units Subcutaneous TID WC  . insulin aspart  0-5 Units Subcutaneous QHS  . ipratropium-albuterol  3 mL Nebulization TID  . magic mouthwash  5 mL Oral QID  . metoprolol  25 mg Oral BID  . midodrine  10 mg Oral TID WC  . piperacillin-tazobactam (ZOSYN)  IV  2.25 g Intravenous 3 times per day  . sodium chloride  500 mL Intravenous Once  . sodium chloride  3  mL Intravenous Q12H  . tiotropium  18 mcg Inhalation Daily  . Warfarin - Pharmacist Dosing Inpatient   Does not apply q1800    Background 80 with over 37yr HTN, DJD, severe COPD, CAD, prior ureteral stents, and known advancing late stage 4 CKD. Baseline Cr 3.9. Adm with cough, SOB, edema, worsening renal failure.  Dialysis initiated for worsening renal function/volume overload  ->new ESRD.  Assessment/Plan:  1. New start ESRD, dialysis dependent. 1st HD was 11/26. S/p AVF [facility] Dr. Scot Dock 11/29. [facility] 11/25.   CLIPPED for [facility] MWF 2nd shift but not ready to go home yet. Received daily HD this week for volume - yesterday got no fluid off. Has to stay on BB per cards for permanent AF. Will just have to continue with midodrine. Giving him a break today, HD tomorrow (his outpt schedule will be MWF) 2. Anemia on Darbe 200 QThurs Last received 12/2. Since MWF changed to QFriday 3. HPTH on vit D - on calcitriol 0.25/day. Last PTH 159 on 11/23. Will get Hectorol at [place] - changed to 1 mcg TIW on MWF.  4. COPD longstanding - bronchodilators 5. Diastolic heart failure. LVEF 55-60.  6. AFib -- coumadin. Pharmacy dosing. Metoprolol for rate control -  Cards says no amio since AF is permanent and not paroxysmal so will have to continue the BB.  7. DJD 8. CHF/Vol overload - still LE edema 3+/CXR pleural effusions - dialysis/UF. 9. DM2 10. Cough with CXR 12/4 compressive atelectasis right lung/leukocytosis  - poss PNA - on vanco and zosyn. His sputum is still discolored but his cough is diminishing some. Still O2 dependent.  11. MRSA PCR + 12. Dispo - needs PT consult. Has not ambulated recently (he says) and plans are for him to go home  Jamal Maes, MD [place] Pager 05/30/2015, 11:38 AM

## 2015-05-30 NOTE — Progress Notes (Signed)
[facility]                                                                              Patient Demographics  Terry [name], is a 80 y.o. male, DOB - [birth year], CM:[id number]  Admit date - 05/13/2015   Admitting Physician Edwin Dada, MD  Outpatient Primary MD for the patient is [name], DO  LOS - 16   Chief Complaint  Patient presents with  . Cough  . Shortness of Breath       Brief HPI   80 year old male with a history of COPD, hypertension, CAD/CABG, CKD stage IV, atrial fibrillation diagnosed October 2015 on apixaban presented with one-week history of worsening cough and shortness of breath. The patient was seen by his primary care provider 4 days prior to admission and prescribed Augmentin and prednisone without improvement. He presented to the emergency department where he was noted to have oxygen saturation of 80% on room air and found to be in respiratory distress. The patient was started on doxycycline and Solu-Medrol. The patient has also been describing symptoms of orthopnea and PND with increasing abdominal girth and lower extremity edema for the past week prior to admission.  After admission, the patient was started on intravenous steroids. His breathing did not improve. He subsequently started on intravenous furosemide for volume excess. Ultimately, the patient was declared ESRD and dialysis catheter was placed and dialysis initiated on 05/17/2015.   Assessment & Plan    Principal Problem: Acute respiratory failure with hypoxia- states cough is improving - Secondary to CHF and COPD exacerbation, volume overload, O2 sats 91% on 4L  - continue Pulmonary hygiene and treatment for COPD/HD for volume control  - Repeat chest x-ray 12/4, shows pleural effusion with compressive right-sided atelectasis, however cough is worsening per patient with phlegm,hence placed on IV Zosyn and Vanc (history of +MRSA PCR). Per patient's wife, had a  history of severe pneumonia in the past. - Continue tesselon perles, tussinex (cough suppressant). Cough is significantly improving, slept well last night  Active problems Acute diastolic CHF  - remains clinically volume overloaded, on back-to-back hemodialysis. Furosemide discontinued as patient is started on hemodialysis.  -2-D echo on 05/16/15 showed EF 55-60%, trivial MR/TR - Weight 208 pounds on 11/22> 194 pounds on 12/8 -Volume now managed with HD, low-sodium diet however BP also limiting factor   Acute on chronic renal failure (CKD 4-5), Now new on ESRD started on HD -Previous Baseline creatinine 3.2-3.4. Nephrology and vascular surgery was consulted. Patient underwent right AV fistula placement on 05/21/15. PermCath was placed by radiology on 05/17/15 -Patient started on hemodialysis, needs Klonopin 1 mg prior to HD for anxiety and cramping.  - Robaxin for cramping with HD  - CLIPPED with [facility] HD outpatient unit but not ready yet, start physical therapy.  COPD exacerbation  -Patient has >60 pk years, completed steroid tapering, continue bronchodilators  Atrial fibrillation  -CHADS-VASc = 6 -Discontinued apixaban due to worsening renal function/new ESRD -continue lovenox and coumadin bridge-INR 1.44 -Patient is on metoprolol tartrate for rate control however this is also causing hypotension and limiting factor for  hemodialysis. Requested cardiology consult however amiodarone is currently not an option as it is of paroxysmal atrial fibrillation. Cardiology recommended to continue metoprolol and midodrine.   hypotension with history of Hypertension -Continue metoprolol, midodrine -HD helping with volume and BP control  Diabetes mellitus type 2 -Checked hemoglobin A1c--6.6, pt agreeable to start po meds upon d/c (low dose glipizide or glyburide) -NovoLog sliding scale while inpatient   Coronary artery disease -Cardiac cath 11/30/1997. Mild distal Left main  stenosis, 25% proximal LAD, minor irregularities Circumflex, 95% mid RCA stenosis treated with 3.5 x 32 mm bare metal stent -no angina presently   Hyponatremia -Secondary to worsening renal function and fluid overload, improving with HD  Code Status: Full code  Family Communication: Discussed in detail with the patient, all imaging results, lab results explained to the patient.   Disposition Plan:   Time Spent in minutes  25 minutes  Procedures  HD  Consults   Renal Cardiology  DVT Prophylaxis  Lovenox  Medications  Scheduled Meds: . atorvastatin  20 mg Oral q1800  . benzonatate  200 mg Oral TID  . budesonide-formoterol  2 puff Inhalation BID  . chlorpheniramine-HYDROcodone  5 mL Oral Q12H  . [START ON 05/31/2015] darbepoetin (ARANESP) injection - DIALYSIS  200 mcg Intravenous Q Fri-HD  . doxercalciferol  1 mcg Intravenous Q M,W,F-HD  . insulin aspart  0-15 Units Subcutaneous TID WC  . insulin aspart  0-5 Units Subcutaneous QHS  . ipratropium-albuterol  3 mL Nebulization TID  . magic mouthwash  5 mL Oral QID  . metoprolol  25 mg Oral BID  . midodrine  10 mg Oral TID WC  . piperacillin-tazobactam (ZOSYN)  IV  2.25 g Intravenous 3 times per day  . sodium chloride  500 mL Intravenous Once  . sodium chloride  3 mL Intravenous Q12H  . tiotropium  18 mcg Inhalation Daily  . Warfarin - Pharmacist Dosing Inpatient   Does not apply q1800   Continuous Infusions: . sodium chloride 10 mL/hr at 05/21/15 0946   PRN Meds:.albuterol, clonazePAM, HYDROcodone-acetaminophen, methocarbamol, morphine injection, ondansetron **OR** ondansetron (ZOFRAN) IV, oxyCODONE   Antibiotics   Anti-infectives    Start     Dose/Rate Route Frequency Ordered Stop   05/31/15 1200  vancomycin (VANCOCIN) IVPB 1000 mg/200 mL premix  Status:  Discontinued     1,000 mg 200 mL/hr over 60 Minutes Intravenous Every M-W-F (Hemodialysis) 05/27/15 1351 05/30/15 1056   05/27/15 1200  vancomycin (VANCOCIN) IVPB  1000 mg/200 mL premix  Status:  Discontinued     1,000 mg 200 mL/hr over 60 Minutes Intravenous Every M-W-F (Hemodialysis) 05/26/15 1433 05/27/15 1351   05/26/15 1500  piperacillin-tazobactam (ZOSYN) IVPB 2.25 g     2.25 g 100 mL/hr over 30 Minutes Intravenous 3 times per day 05/26/15 1433     05/26/15 1445  vancomycin (VANCOCIN) 2,000 mg in sodium chloride 0.9 % 500 mL IVPB     2,000 mg 250 mL/hr over 120 Minutes Intravenous NOW 05/26/15 1433 05/26/15 1904   05/20/15 0600  ceFAZolin (ANCEF) IVPB 1 g/50 mL premix    Comments:  Send with pt to OR   1 g 100 mL/hr over 30 Minutes Intravenous To ShortStay Surgical 05/20/15 1011 05/21/15 0600   05/17/15 1530  [MAR Hold]  ceFAZolin (ANCEF) IVPB 2 g/50 mL premix     (MAR Hold since 05/21/15 0912)   2 g 100 mL/hr over 30 Minutes Intravenous  Once 05/17/15 1518 05/21/15 1059   05/17/15 1448  ceFAZolin (ANCEF) 2-3 GM-% IVPB SOLR    CommentsDesiree Hane   : cabinet override      05/17/15 1448 05/17/15 1519   05/14/15 1000  doxycycline (VIBRA-TABS) tablet 100 mg     100 mg Oral Every 12 hours 05/14/15 0116 05/19/15 2218   05/13/15 2145  azithromycin (ZITHROMAX) 500 mg in dextrose 5 % 250 mL IVPB     500 mg 250 mL/hr over 60 Minutes Intravenous  Once 05/13/15 2137 05/13/15 2258        Subjective:   Terry [name] was seen and examined today. Sitting in the bed today, feels a lot better, slept well last night, overall improving, no fevers or chills. Still very edematous. Patient denies dizziness, abdominal pain, N/V/D/C, new weakness, numbess, tingling.    Objective:   Blood pressure 131/53, pulse 117, temperature 98.5 F (36.9 C), temperature source Oral, resp. rate 20, height 5\' 8"  (1.727 m), weight 88.3 kg (194 lb 10.7 oz), SpO2 91 %.  Wt Readings from Last 3 Encounters:  05/30/15 88.3 kg (194 lb 10.7 oz)  05/01/15 93.895 kg (207 lb)  03/20/15 94.348 kg (208 lb)     Intake/Output Summary (Last 24 hours) at 05/30/15 1326 Last  data filed at 05/30/15 1059  Gross per 24 hour  Intake    440 ml  Output    500 ml  Net    -60 ml    Exam  General: Alert and oriented x 3, NAD  HEENT:  PERRLA, EOM  Neck: Supple, no JVD, no masses  CVS: S1 S2 clear, RRR  Respiratory: Clear to auscultation bilaterally  Abdomen: Soft, nontender, nondistended, + bowel sounds  Ext: no cyanosis clubbing, 3+ pitting edema  Neuro: no new deficits  Skin: No rashes  Psych: Normal affect and demeanor, alert and oriented x3    Data Review   Micro Results Recent Results (from the past 240 hour(s))  Surgical pcr screen     Status: Abnormal   Collection Time: 05/21/15  5:09 AM  Result Value Ref Range Status   MRSA, PCR POSITIVE (A) NEGATIVE Final   Staphylococcus aureus POSITIVE (A) NEGATIVE Final    Comment:        The Xpert SA Assay (FDA approved for NASAL specimens in patients over 39 years of age), is one component of a comprehensive surveillance program.  Test performance has been validated by [facility] for patients greater than or equal to 103 year old. It is not intended to diagnose infection nor to guide or monitor treatment.     Radiology Reports Dg Chest 2 View  05/26/2015  CLINICAL DATA:  History of pneumonia.  Shortness of breath. EXAM: CHEST  2 VIEW COMPARISON:  05/24/2015 FINDINGS: Large bore right internal jugular approach central venous catheter is stable. Cardiomediastinal silhouette is normal. Mediastinal contours appear intact. There is no evidence of pneumothorax. There is a right pleural effusion with linear airspace opacity in the right lower lobe likely representing atelectasis. The left costophrenic angle is incompletely visualized, and therefore small left pleural effusion cannot be excluded. Osseous structures are without acute abnormality. Soft tissues are grossly normal. IMPRESSION: Right pleural effusion with probably compressive atelectasis in the right lower lobe. Left costophrenic angle  incompletely visualized, and therefore small left pleural effusion cannot be excluded. Electronically Signed   By: Fidela Salisbury M.D.   On: 05/26/2015 11:09   Dg Chest 2 View  05/13/2015  CLINICAL DATA:  80 year old with 1 week history of productive  cough and severe shortness of breath. Former smoker with current history of COPD. EXAM: CHEST  2 VIEW COMPARISON:  05/28/2014 and earlier. FINDINGS: AP erect and lateral imaging was performed. Cardiac silhouette mildly enlarged with a right coronary artery stent. Thoracic aorta atherosclerotic, unchanged. Prominent central pulmonary arteries, unchanged. Scarring in the lower lobes, right greater than left, unchanged. Hyperinflation, emphysematous changes in the upper lobes, and mild central peribronchial thickening, unchanged. No new pulmonary parenchymal abnormalities. Degenerative disc disease and spondylosis involving the thoracic spine. IMPRESSION: COPD/emphysema. Scarring in the lower lobes, right greater than left. No acute cardiopulmonary disease. Stable examination. Electronically Signed   By: Evangeline Dakin M.D.   On: 05/13/2015 21:03   US Renal  05/15/2015  CLINICAL DATA:  Acute renal injury EXAM: RENAL / URINARY TRACT ULTRASOUND COMPLETE COMPARISON:  10/24/2014 FINDINGS: Right Kidney: Length: 14.8 cm. Multiple cysts are noted. The largest of these measures 2.2 cm in greatest dimension. Left Kidney: Length: 14.0 cm. Multiple cysts are noted. The largest of these measures 3.1 cm in greatest dimension. Bladder: Appears normal for degree of bladder distention. IMPRESSION: Bilateral renal cysts similar to that seen on prior MRI examination. No acute obstructive changes are noted. Electronically Signed   By: Inez Catalina M.D.   On: 05/15/2015 16:29   Ir Fluoro Guide Cv Line Right  05/17/2015  INDICATION: Chronic kidney disease with early uremia. Need to start hemodialysis. EXAM: FLUOROSCOPIC AND ULTRASOUND GUIDED PLACEMENT OF A TUNNELED DIALYSIS  CATHETER Physician: Stephan Minister. Henn, MD FLUOROSCOPY TIME:  42 seconds, 13.2 mGy MEDICATIONS: 2 g Ancef. As antibiotic prophylaxis, Ancef was ordered pre-procedure and administered intravenously within one hour of incision. 0.5 mg versed, 25 mcg fentanyl. A radiology nurse monitored the patient for moderate sedation. ANESTHESIA/SEDATION: Moderate sedation time: 30 minutes PROCEDURE: Informed consent was obtained for placement of a tunneled dialysis catheter. The patient was placed supine on the interventional table. Ultrasound confirmed a patent right internal jugularvein. Ultrasound images were obtained for documentation. The right side of the neck was prepped and draped in a sterile fashion. The right side of the neck was anesthetized with 1% lidocaine. Maximal barrier sterile technique was utilized including caps, mask, sterile gowns, sterile gloves, sterile drape, hand hygiene and skin antiseptic. A small incision was made with #11 blade scalpel. A 21 gauge needle directed into the right internal jugular vein with ultrasound guidance. A micropuncture dilator set was placed. A 23 cm tip to cuff Palindrome catheter was selected. The skin below the right clavicle was anesthetized and a small incision was made with an #11 blade scalpel. A subcutaneous tunnel was formed to the vein dermatotomy site. The catheter was brought through the tunnel. The vein dermatotomy site was dilated to accommodate a peel-away sheath. The catheter was placed through the peel-away sheath and directed into the central venous structures. The tip of the catheter was placed at the superior cavoatrial junction with fluoroscopy. Fluoroscopic images were obtained for documentation. Both lumens were found to aspirate and flush well. The proper amount of heparin was flushed in both lumens. The vein dermatotomy site was closed using a single layer of absorbable suture and Dermabond. The catheter was secured to the skin using Prolene suture. FINDINGS:  Catheter tip at the superior cavoatrial junction. Estimated blood loss: Minimal COMPLICATIONS: None IMPRESSION: Successful placement of a right jugular tunneled dialysis catheter using ultrasound and fluoroscopic guidance. Electronically Signed   By: Markus Daft M.D.   On: 05/17/2015 16:23   Ir US Guide Vasc  Access Right  05/17/2015  INDICATION: Chronic kidney disease with early uremia. Need to start hemodialysis. EXAM: FLUOROSCOPIC AND ULTRASOUND GUIDED PLACEMENT OF A TUNNELED DIALYSIS CATHETER Physician: Stephan Minister. Henn, MD FLUOROSCOPY TIME:  42 seconds, 13.2 mGy MEDICATIONS: 2 g Ancef. As antibiotic prophylaxis, Ancef was ordered pre-procedure and administered intravenously within one hour of incision. 0.5 mg versed, 25 mcg fentanyl. A radiology nurse monitored the patient for moderate sedation. ANESTHESIA/SEDATION: Moderate sedation time: 30 minutes PROCEDURE: Informed consent was obtained for placement of a tunneled dialysis catheter. The patient was placed supine on the interventional table. Ultrasound confirmed a patent right internal jugularvein. Ultrasound images were obtained for documentation. The right side of the neck was prepped and draped in a sterile fashion. The right side of the neck was anesthetized with 1% lidocaine. Maximal barrier sterile technique was utilized including caps, mask, sterile gowns, sterile gloves, sterile drape, hand hygiene and skin antiseptic. A small incision was made with #11 blade scalpel. A 21 gauge needle directed into the right internal jugular vein with ultrasound guidance. A micropuncture dilator set was placed. A 23 cm tip to cuff Palindrome catheter was selected. The skin below the right clavicle was anesthetized and a small incision was made with an #11 blade scalpel. A subcutaneous tunnel was formed to the vein dermatotomy site. The catheter was brought through the tunnel. The vein dermatotomy site was dilated to accommodate a peel-away sheath. The catheter was  placed through the peel-away sheath and directed into the central venous structures. The tip of the catheter was placed at the superior cavoatrial junction with fluoroscopy. Fluoroscopic images were obtained for documentation. Both lumens were found to aspirate and flush well. The proper amount of heparin was flushed in both lumens. The vein dermatotomy site was closed using a single layer of absorbable suture and Dermabond. The catheter was secured to the skin using Prolene suture. FINDINGS: Catheter tip at the superior cavoatrial junction. Estimated blood loss: Minimal COMPLICATIONS: None IMPRESSION: Successful placement of a right jugular tunneled dialysis catheter using ultrasound and fluoroscopic guidance. Electronically Signed   By: Markus Daft M.D.   On: 05/17/2015 16:23   Dg Chest Port 1 View  05/24/2015  CLINICAL DATA:  Shortness of breath. Finished hemodialysis 1 hour previously. EXAM: PORTABLE CHEST 1 VIEW COMPARISON:  05/13/2015. FINDINGS: Normal sized heart. Clear lungs. Interval right jugular catheter with its tip in the inferior aspect of the superior vena cava. No pneumothorax. Mild right basilar atelectasis and patchy opacity with mild progression. Clear left lung. Mild lower thoracic spine degenerative changes. IMPRESSION: Mild right basilar atelectasis and possible pneumonia. Electronically Signed   By: Claudie Revering M.D.   On: 05/24/2015 20:50    CBC  Recent Labs Lab 05/24/15 1400 05/25/15 0529 05/27/15 1000 05/28/15 0513  WBC 14.8* 12.6* 15.3* 15.0*  HGB 8.7* 8.6* 8.3* 8.7*  HCT 26.5* 28.0* 25.9* 27.1*  PLT 153 173 141* 159  MCV 94.0 97.2 94.9 93.8  MCH 30.9 29.9 30.4 30.1  MCHC 32.8 30.7 32.0 32.1  RDW 15.5 15.9* 16.3* 16.0*    Chemistries   Recent Labs Lab 05/25/15 1424 05/27/15 1000 05/28/15 1030 05/29/15 0708 05/30/15 0720  NA 132* 129* 133* 134* 132*  K 4.1 4.5 3.8 4.6 4.6  CL 97* 94* 95* 93* 92*  CO2 26 23 26 28 29   GLUCOSE 101* 190* 216* 122* 90  BUN  34* 41* 36* 26* 18  CREATININE 4.51* 5.88* 5.22* 4.43* 3.94*  CALCIUM 8.0* 8.1* 8.2* 8.6*  8.7*   ------------------------------------------------------------------------------------------------------------------ estimated creatinine clearance is 16.7 mL/min (by C-G formula based on Cr of 3.94). ------------------------------------------------------------------------------------------------------------------ No results for input(s): HGBA1C in the last 72 hours. ------------------------------------------------------------------------------------------------------------------ No results for input(s): CHOL, HDL, LDLCALC, TRIG, CHOLHDL, LDLDIRECT in the last 72 hours. ------------------------------------------------------------------------------------------------------------------ No results for input(s): TSH, T4TOTAL, T3FREE, THYROIDAB in the last 72 hours.  Invalid input(s): FREET3 ------------------------------------------------------------------------------------------------------------------  Recent Labs  05/28/15 1030  TIBC 157*  IRON 11*    Coagulation profile  Recent Labs Lab 05/26/15 0540 05/27/15 0539 05/28/15 0513 05/29/15 0708 05/30/15 0720  INR 1.44 2.51* 2.50* 3.10* 3.46*    No results for input(s): DDIMER in the last 72 hours.  Cardiac Enzymes No results for input(s): CKMB, TROPONINI, MYOGLOBIN in the last 168 hours.  Invalid input(s): CK ------------------------------------------------------------------------------------------------------------------ Invalid input(s): Lydia  05/28/15 2148 05/29/15 0811 05/29/15 1457 05/29/15 2211 05/30/15 0759 05/30/15 1148  GLUCAP 120* 179* 73 130* 67 133*     Marycarmen Hagey M.D. [facility] 05/30/2015, 1:26 PM  Pager: [phone] Between 7am to 7pm - call Pager - [phone]  After 7pm go to [email or web address] - password [email or web address]  Call night coverage person covering after 7pm

## 2015-05-31 ENCOUNTER — Inpatient Hospital Stay (HOSPITAL_COMMUNITY): Payer: Medicare PPO

## 2015-05-31 LAB — CBC
HCT: 24 % — ABNORMAL LOW (ref 39.0–52.0)
Hemoglobin: 7.5 g/dL — ABNORMAL LOW (ref 13.0–17.0)
MCH: 29.5 pg (ref 26.0–34.0)
MCHC: 31.3 g/dL (ref 30.0–36.0)
MCV: 94.5 fL (ref 78.0–100.0)
PLATELETS: 163 10*3/uL (ref 150–400)
RBC: 2.54 MIL/uL — AB (ref 4.22–5.81)
RDW: 16.2 % — AB (ref 11.5–15.5)
WBC: 5.6 10*3/uL (ref 4.0–10.5)

## 2015-05-31 LAB — PROTIME-INR
INR: 3.32 — AB (ref 0.00–1.49)
PROTHROMBIN TIME: 33 s — AB (ref 11.6–15.2)

## 2015-05-31 LAB — BASIC METABOLIC PANEL
ANION GAP: 12 (ref 5–15)
BUN: 36 mg/dL — ABNORMAL HIGH (ref 6–20)
CALCIUM: 8.6 mg/dL — AB (ref 8.9–10.3)
CO2: 28 mmol/L (ref 22–32)
Chloride: 91 mmol/L — ABNORMAL LOW (ref 101–111)
Creatinine, Ser: 6.13 mg/dL — ABNORMAL HIGH (ref 0.61–1.24)
GFR, EST AFRICAN AMERICAN: 9 mL/min — AB (ref >60)
GFR, EST NON AFRICAN AMERICAN: 8 mL/min — AB (ref >60)
Glucose, Bld: 107 mg/dL — ABNORMAL HIGH (ref 65–99)
POTASSIUM: 4.8 mmol/L (ref 3.5–5.1)
SODIUM: 131 mmol/L — AB (ref 135–145)

## 2015-05-31 LAB — GLUCOSE, CAPILLARY
GLUCOSE-CAPILLARY: 100 mg/dL — AB (ref 65–99)
GLUCOSE-CAPILLARY: 105 mg/dL — AB (ref 65–99)
GLUCOSE-CAPILLARY: 110 mg/dL — AB (ref 65–99)
GLUCOSE-CAPILLARY: 106 mg/dL — AB (ref 65–99)

## 2015-05-31 MED ORDER — ALBUMIN HUMAN 25 % IV SOLN
INTRAVENOUS | Status: AC
  Administered 2015-05-31: 25 g via INTRAVENOUS
  Filled 2015-05-31: qty 100

## 2015-05-31 MED ORDER — ALBUMIN HUMAN 25 % IV SOLN
25.0000 g | Freq: Once | INTRAVENOUS | Status: AC
  Administered 2015-05-31: 25 g via INTRAVENOUS

## 2015-05-31 MED ORDER — SODIUM CHLORIDE 0.9 % IV SOLN
125.0000 mg | INTRAVENOUS | Status: DC
  Administered 2015-06-03: 125 mg via INTRAVENOUS
  Filled 2015-05-31 (×2): qty 10

## 2015-05-31 MED ORDER — VANCOMYCIN HCL IN DEXTROSE 1-5 GM/200ML-% IV SOLN
1000.0000 mg | Freq: Once | INTRAVENOUS | Status: DC
  Filled 2015-05-31: qty 200

## 2015-05-31 MED ORDER — VANCOMYCIN HCL IN DEXTROSE 1-5 GM/200ML-% IV SOLN
1000.0000 mg | Freq: Once | INTRAVENOUS | Status: AC
  Administered 2015-05-31: 1000 mg via INTRAVENOUS
  Filled 2015-05-31: qty 200

## 2015-05-31 MED ORDER — GUAIFENESIN-DM 100-10 MG/5ML PO SYRP
5.0000 mL | ORAL_SOLUTION | ORAL | Status: DC | PRN
  Administered 2015-06-01 – 2015-06-02 (×3): 5 mL via ORAL
  Filled 2015-05-31 (×3): qty 5

## 2015-05-31 MED ORDER — DOXERCALCIFEROL 4 MCG/2ML IV SOLN
INTRAVENOUS | Status: AC
  Administered 2015-05-31: 4 ug
  Filled 2015-05-31: qty 2

## 2015-05-31 MED ORDER — MIDODRINE HCL 5 MG PO TABS
ORAL_TABLET | ORAL | Status: AC
  Administered 2015-05-31: 5 mg
  Filled 2015-05-31: qty 2

## 2015-05-31 NOTE — Progress Notes (Signed)
[facility] Rounding Note  Subjective:  Had HD Mon, Tues, Wed - did not tolerate UF on Wed so gave him a break yesterday Back in HD today - tolerating so far Cards has seen and amio not option for AF b/c not paroxysmal so stuck with BB and midodrine Sputum is still a little discolored and still wearing O2 but feeling better  Objective:  Physical Examination BP 108/55 mmHg  Pulse 92  Temp(Src) 97 F (36.1 C) (Oral)  Resp 21  Ht 5\' 8"  (1.727 m)  Wt 197 lb 1.5 oz (89.4 kg)  BMI 29.97 kg/m2  SpO2 90% Weight trending:(Max weight this hosp 100.6 kg) 12/5 pre wt 90.6 Post wt 88.1 12/6 pre wt 92.3 Post wt 87.5 12/7 pre wt 88.1 Post wt 88 12/9 pre wt 89.4  Sleeping on HD Has been very pleasant today Still wearing O2 Coarse BS/ clear ant TDC dressing intact and catheter running at 400 Abdomen obese 2+ pitting edema bilateral LE's still very doughy R AVF with hematoma at incision site, + bruit Nice vein  Lab Results:  Recent Labs  05/31/15 0456  WBC 5.6  HGB 7.5*  HCT 24.0*  PLT 163   Lab Results  Component Value Date   INR 3.32* 05/31/2015   INR 3.46* 05/30/2015   INR 3.10* 05/29/2015     Recent Labs  05/30/15 0720 05/31/15 0456  NA 132* 131*  K 4.6 4.8  CL 92* 91*  CO2 29 28  GLUCOSE 90 107*  BUN 18 36*  CREATININE 3.94* 6.13*  CALCIUM 8.7* 8.6*    Scheduled Medications: . atorvastatin  20 mg Oral q1800  . benzonatate  200 mg Oral TID  . budesonide-formoterol  2 puff Inhalation BID  . chlorpheniramine-HYDROcodone  5 mL Oral Q12H  . darbepoetin (ARANESP) injection - DIALYSIS  200 mcg Intravenous Q Fri-HD  . doxercalciferol  1 mcg Intravenous Q M,W,F-HD  . insulin aspart  0-15 Units Subcutaneous TID WC  . insulin aspart  0-5 Units Subcutaneous QHS  . ipratropium-albuterol  3 mL Nebulization TID  . magic mouthwash  5 mL Oral QID  . metoprolol  25 mg Oral BID  . midodrine  10 mg Oral TID WC  . piperacillin-tazobactam (ZOSYN)  IV   2.25 g Intravenous 3 times per day  . sodium chloride  500 mL Intravenous Once  . sodium chloride  3 mL Intravenous Q12H  . tiotropium  18 mcg Inhalation Daily  . vancomycin  1,000 mg Intravenous Once  . Warfarin - Pharmacist Dosing Inpatient   Does not apply q1800    Background 80 with over 58yr HTN, DJD, severe COPD, CAD, AF, prior ureteral stents, and known advancing late stage 4 CKD. Baseline Cr 3.9. Adm with cough, SOB, edema, worsening renal failure.  Dialysis initiated for worsening renal function/volume overload  ->new ESRD.  Assessment/Plan:  1. New start ESRD, dialysis dependent. 1st HD was 11/26. S/p AVF RUA Dr. Scot Dock 11/29. [facility] 11/25.   CLIPPED for [facility] MWF 2nd shift. Probs with low BP and fluid removal.  Has to stay on BB per cards for permanent AF per cards. Will just have to continue with midodrine. HD today. 2. Anemia on Darbe 200 QThurs Last received 12/2. Since MWF changed to QFriday. TSat 7% on 12/6. Hb down to 7.5. Rec'd feraheme earlier in the admission but tsat remains low. Starting Ferrlecit with HD - total dose 1 gram 3. HPTH on vit D - PTH 159 on 11/23. Hectorol  1 mcg TIW on MWF with HD.  4. COPD longstanding - bronchodilators 5. Diastolic heart failure. LVEF 55-60.  6. AFib -- coumadin. Pharmacy dosing. Metoprolol for rate control - Cards says no amio since AF is permanent and not paroxysmal so will have to continue the BB. He will need to have his coumadin followed either by PCP or cardiology at discharge - dialysis unit is not set up to manage  7. DJD 8. CHF/Vol overload - having problems getting volume off.  Doing the best we can given BP limitations. Highest weight this admission 220 lb. We are down in the 194-197 range now but we are nowhere near a dry weight. 9. DM2 10. Cough with CXR 12/4 compressive atelectasis right lung/leukocytosis. Being treated as HCAP. Still on vanco and zosyn. His sputum is still discolored but his cough is diminishing some. Still  O2 dependent.  11. MRSA PCR + 12. Dispo - needs PT consult. Has not ambulated recently (he says) and plans are for him to go home...  Jamal Maes, MD [facility] [phone] Pager 05/31/2015, 12:01 PM

## 2015-05-31 NOTE — Procedures (Signed)
I have personally attended this patient's dialysis session.   2K bath, tight heparin Trying for 3 liter goal ast BP tolerates Rec'd midodrine and also using some albumin BP dipping 80's asymptomatic. Will try to keep systolic above 0000000 if possible  Jamal Maes, MD [place] Pager 05/31/2015, 12:15 PM

## 2015-05-31 NOTE — Progress Notes (Signed)
ANTICOAGULATION  Antibiotic CONSULT NOTE - Follow Up Consult  Pharmacy Consult for Warfarin  / Vancomycin and Zosyn Indication: atrial fibrillation / Pneumonia  Allergies  Allergen Reactions  . Yellow Dyes (Non-Tartrazine) Other (See Comments)    Burns arms Cough medications (tussionex and benzonatate ok)  . Levaquin [Levofloxacin In D5w] Itching    Patient Measurements: Height: 5\' 8"  (172.7 cm) Weight: 197 lb 1.5 oz (89.4 kg) IBW/kg (Calculated) : 68.4  Vital Signs: Temp: 97 F (36.1 C) (12/09 0926) Temp Source: Oral (12/09 0926) BP: 108/52 mmHg (12/09 1300) Pulse Rate: 88 (12/09 1300)  Labs:  Recent Labs  05/29/15 0708 05/30/15 0720 05/31/15 0456  HGB  --   --  7.5*  HCT  --   --  24.0*  PLT  --   --  163  LABPROT 31.4* 34.1* 33.0*  INR 3.10* 3.46* 3.32*  CREATININE 4.43* 3.94* 6.13*    Estimated Creatinine Clearance: 10.8 mL/min (by C-G formula based on Cr of 6.13).   Assessment: 80 y/o male with CKD V who was on apixiban PTA for Afib who has now transitioned to ESRD and is HD dependent.  AC/heme: Lovenox for hx afib (PTA apixaban - changed d/t renal dysfxn, renal MD wanted lovenox) -  INR = 3.32 CBC stable, no bleeding noted. Initiated Coumadin 12/1. High fall risk. CHADS-VASc = 6  Vancomycin / Zosyn D#6  for pneumonia, Receiving extra HD sessions, afebrile HD today  Goal of Therapy:  INR 2-3 Anti-Xa level 0.6-1 units/ml 4hrs after LMWH dose given Monitor platelets by anticoagulation protocol: Yes   Appropriate antibiotic dosing   Plan:  - No Coumadin today - Daily INR, CBC q72h  - Monitor for s/sx of bleeding - Continue Zosyn 2.25 grams iv Q 8 hours - Continue Vancomycin after HD - 1 gram iv x 1 dose today  Thank you Anette Guarneri, PharmD [phone] 05/31/2015 1:05 PM

## 2015-05-31 NOTE — Progress Notes (Signed)
Called Wife to let her know that we are going to perform a renal US

## 2015-05-31 NOTE — Progress Notes (Signed)
[facility]                                                                              Patient Demographics  Terry [name], is a 80 y.o. male, DOB - 11-29-1934, [id number]  Admit date - 05/13/2015   Admitting Physician Edwin Dada, MD  Outpatient Primary MD for the patient is [name], DO  LOS - 13   Chief Complaint  Patient presents with  . Cough  . Shortness of Breath       Brief HPI   80 year old male with a history of COPD, hypertension, CAD/CABG, CKD stage IV, atrial fibrillation diagnosed October 2015 on apixaban presented with one-week history of worsening cough and shortness of breath. The patient was seen by his primary care provider 4 days prior to admission and prescribed Augmentin and prednisone without improvement. He presented to the emergency department where he was noted to have oxygen saturation of 80% on room air and found to be in respiratory distress. The patient was started on doxycycline and Solu-Medrol. The patient has also been describing symptoms of orthopnea and PND with increasing abdominal girth and lower extremity edema for the past week prior to admission.  After admission, the patient was started on intravenous steroids. His breathing did not improve. He subsequently started on intravenous furosemide for volume excess. Ultimately, the patient was declared ESRD and dialysis catheter was placed and dialysis initiated on 05/17/2015.   Assessment & Plan    Principal Problem: Acute respiratory failure with hypoxia- states cough is improving, overall improving - Secondary to CHF and COPD exacerbation, volume overload, O2 sats 91% on 4L  - continue Pulmonary hygiene and treatment for COPD/HD for volume control  - Repeat chest x-ray 12/4, shows pleural effusion with compressive right-sided atelectasis, however cough is worsening per patient with phlegm,hence placed on IV Zosyn and Vanc (history of +MRSA PCR). Per patient's  wife, had a history of severe pneumonia in the past. - Continue tesselon perles, tussinex (cough suppressant).  Active problems Acute diastolic CHF  - remains clinically volume overloaded, on back-to-back hemodialysis. Furosemide discontinued as patient is started on hemodialysis.  -2-D echo on 05/16/15 showed EF 55-60%, trivial MR/TR - Weight 208 pounds on 11/22> 194 pounds on 12/8 -Volume now managed with HD, low-sodium diet however BP also limiting factor   Acute on chronic renal failure (CKD 4-5), Now new on ESRD started on HD -Previous Baseline creatinine 3.2-3.4. Nephrology and vascular surgery was consulted. Patient underwent right AV fistula placement on 05/21/15. PermCath was placed by radiology on 05/17/15 -Patient started on hemodialysis, needs Klonopin 1 mg prior to HD for anxiety and cramping.  - Robaxin for cramping with HD  - CLIPPED with [facility] outpatient unit but not ready yet - PT recommending skilled nursing facility, very deconditioned, wife works during the day, discussed with social work  COPD exacerbation  -Patient has >60 pk years, completed steroid tapering, continue bronchodilators  Atrial fibrillation  -CHADS-VASc = 6 -Discontinued apixaban due to worsening renal function/new ESRD -continue lovenox and coumadin bridge-INR 1.44 -Patient is on metoprolol tartrate for rate control however this  is also causing hypotension and limiting factor for hemodialysis. Requested cardiology consult however amiodarone is currently not an option as it is of paroxysmal atrial fibrillation. Cardiology recommended to continue metoprolol and midodrine.   hypotension with history of Hypertension -Continue metoprolol, midodrine -HD helping with volume and BP control  Diabetes mellitus type 2 -Checked hemoglobin A1c--6.6, pt agreeable to start po meds upon d/c (low dose glipizide or glyburide) -NovoLog sliding scale while inpatient   Coronary artery  disease -Cardiac cath 11/30/1997. Mild distal Left main stenosis, 25% proximal LAD, minor irregularities Circumflex, 95% mid RCA stenosis treated with 3.5 x 32 mm bare metal stent -no angina presently   Hyponatremia -Secondary to worsening renal function and fluid overload, improving with HD  Code Status: Full code  Family Communication: Discussed in detail with the patient, all imaging results, lab results explained to the patient.   Disposition Plan: - PT recommending skilled nursing facility, very deconditioned, wife works during the day, discussed with social work  Time Spent in minutes  15 minutes  Procedures  HD  Consults   Renal Cardiology  DVT Prophylaxis  Lovenox  Medications  Scheduled Meds: . atorvastatin  20 mg Oral q1800  . benzonatate  200 mg Oral TID  . budesonide-formoterol  2 puff Inhalation BID  . chlorpheniramine-HYDROcodone  5 mL Oral Q12H  . darbepoetin (ARANESP) injection - DIALYSIS  200 mcg Intravenous Q Fri-HD  . doxercalciferol  1 mcg Intravenous Q M,W,F-HD  . [START ON 06/03/2015] ferric gluconate (FERRLECIT/NULECIT) IV  125 mg Intravenous Q M,W,F-HD  . insulin aspart  0-15 Units Subcutaneous TID WC  . insulin aspart  0-5 Units Subcutaneous QHS  . ipratropium-albuterol  3 mL Nebulization TID  . magic mouthwash  5 mL Oral QID  . metoprolol  25 mg Oral BID  . midodrine      . midodrine  10 mg Oral TID WC  . piperacillin-tazobactam (ZOSYN)  IV  2.25 g Intravenous 3 times per day  . sodium chloride  500 mL Intravenous Once  . sodium chloride  3 mL Intravenous Q12H  . tiotropium  18 mcg Inhalation Daily  . vancomycin  1,000 mg Intravenous Once  . Warfarin - Pharmacist Dosing Inpatient   Does not apply q1800   Continuous Infusions: . sodium chloride 10 mL/hr at 05/21/15 0946   PRN Meds:.albuterol, clonazePAM, HYDROcodone-acetaminophen, methocarbamol, morphine injection, ondansetron **OR** ondansetron (ZOFRAN) IV, oxyCODONE   Antibiotics    Anti-infectives    Start     Dose/Rate Route Frequency Ordered Stop   05/31/15 1400  vancomycin (VANCOCIN) IVPB 1000 mg/200 mL premix     1,000 mg 200 mL/hr over 60 Minutes Intravenous  Once 05/31/15 1305     05/31/15 1200  vancomycin (VANCOCIN) IVPB 1000 mg/200 mL premix  Status:  Discontinued     1,000 mg 200 mL/hr over 60 Minutes Intravenous Every M-W-F (Hemodialysis) 05/27/15 1351 05/30/15 1056   05/31/15 1000  vancomycin (VANCOCIN) IVPB 1000 mg/200 mL premix  Status:  Discontinued     1,000 mg 200 mL/hr over 60 Minutes Intravenous  Once 05/31/15 0949 05/31/15 1305   05/27/15 1200  vancomycin (VANCOCIN) IVPB 1000 mg/200 mL premix  Status:  Discontinued     1,000 mg 200 mL/hr over 60 Minutes Intravenous Every M-W-F (Hemodialysis) 05/26/15 1433 05/27/15 1351   05/26/15 1500  piperacillin-tazobactam (ZOSYN) IVPB 2.25 g     2.25 g 100 mL/hr over 30 Minutes Intravenous 3 times per day 05/26/15 1433  05/26/15 1445  vancomycin (VANCOCIN) 2,000 mg in sodium chloride 0.9 % 500 mL IVPB     2,000 mg 250 mL/hr over 120 Minutes Intravenous NOW 05/26/15 1433 05/26/15 1904   05/20/15 0600  ceFAZolin (ANCEF) IVPB 1 g/50 mL premix    Comments:  Send with pt to OR   1 g 100 mL/hr over 30 Minutes Intravenous To [facility] 05/20/15 1011 05/21/15 0600   05/17/15 1530  [MAR Hold]  ceFAZolin (ANCEF) IVPB 2 g/50 mL premix     (MAR Hold since 05/21/15 0912)   2 g 100 mL/hr over 30 Minutes Intravenous  Once 05/17/15 1518 05/21/15 1059   05/17/15 1448  ceFAZolin (ANCEF) 2-3 GM-% IVPB SOLR    Comments:  Desiree Hane   : cabinet override      05/17/15 1448 05/17/15 1519   05/14/15 1000  doxycycline (VIBRA-TABS) tablet 100 mg     100 mg Oral Every 12 hours 05/14/15 0116 05/19/15 2218   05/13/15 2145  azithromycin (ZITHROMAX) 500 mg in dextrose 5 % 250 mL IVPB     500 mg 250 mL/hr over 60 Minutes Intravenous  Once 05/13/15 2137 05/13/15 2258        Subjective:   Terry [name] was seen  and examined today. Overall improving in the last couple days, no chest pain, cough has significantly improved, no fevers or chills. Still very edematous. Patient denies dizziness, abdominal pain, N/V/D/C, new weakness, numbess, tingling.    Objective:   Blood pressure 108/52, pulse 88, temperature 97 F (36.1 C), temperature source Oral, resp. rate 21, height 5\' 8"  (1.727 m), weight 89.4 kg (197 lb 1.5 oz), SpO2 90 %.  Wt Readings from Last 3 Encounters:  05/31/15 89.4 kg (197 lb 1.5 oz)  05/01/15 93.895 kg (207 lb)  03/20/15 94.348 kg (208 lb)     Intake/Output Summary (Last 24 hours) at 05/31/15 1312 Last data filed at 05/31/15 0830  Gross per 24 hour  Intake 579.83 ml  Output    200 ml  Net 379.83 ml    Exam  General: Alert and oriented x 3, NAD  HEENT:  PERRLA, EOM  Neck: Supple, no JVD, no masses  CVS: S1 S2 clear, RRR  Respiratory: Clear to auscultation bilaterally  Abdomen: Soft, nontender, nondistended, + bowel sounds  Ext: no cyanosis clubbing, 2+ pitting edema  Neuro: no new deficits  Skin: No rashes  Psych: Normal affect and demeanor, alert and oriented x3    Data Review   Micro Results No results found for this or any previous visit (from the past 240 hour(s)).  Radiology Reports Dg Chest 2 View  05/26/2015  CLINICAL DATA:  History of pneumonia.  Shortness of breath. EXAM: CHEST  2 VIEW COMPARISON:  05/24/2015 FINDINGS: Large bore right internal jugular approach central venous catheter is stable. Cardiomediastinal silhouette is normal. Mediastinal contours appear intact. There is no evidence of pneumothorax. There is a right pleural effusion with linear airspace opacity in the right lower lobe likely representing atelectasis. The left costophrenic angle is incompletely visualized, and therefore small left pleural effusion cannot be excluded. Osseous structures are without acute abnormality. Soft tissues are grossly normal. IMPRESSION: Right pleural  effusion with probably compressive atelectasis in the right lower lobe. Left costophrenic angle incompletely visualized, and therefore small left pleural effusion cannot be excluded. Electronically Signed   By: Fidela Salisbury M.D.   On: 05/26/2015 11:09   Dg Chest 2 View  05/13/2015  CLINICAL DATA:  80 year old  with 1 week history of productive cough and severe shortness of breath. Former smoker with current history of COPD. EXAM: CHEST  2 VIEW COMPARISON:  05/28/2014 and earlier. FINDINGS: AP erect and lateral imaging was performed. Cardiac silhouette mildly enlarged with a right coronary artery stent. Thoracic aorta atherosclerotic, unchanged. Prominent central pulmonary arteries, unchanged. Scarring in the lower lobes, right greater than left, unchanged. Hyperinflation, emphysematous changes in the upper lobes, and mild central peribronchial thickening, unchanged. No new pulmonary parenchymal abnormalities. Degenerative disc disease and spondylosis involving the thoracic spine. IMPRESSION: COPD/emphysema. Scarring in the lower lobes, right greater than left. No acute cardiopulmonary disease. Stable examination. Electronically Signed   By: Evangeline Dakin M.D.   On: 05/13/2015 21:03   US Renal  05/15/2015  CLINICAL DATA:  Acute renal injury EXAM: RENAL / URINARY TRACT ULTRASOUND COMPLETE COMPARISON:  10/24/2014 FINDINGS: Right Kidney: Length: 14.8 cm. Multiple cysts are noted. The largest of these measures 2.2 cm in greatest dimension. Left Kidney: Length: 14.0 cm. Multiple cysts are noted. The largest of these measures 3.1 cm in greatest dimension. Bladder: Appears normal for degree of bladder distention. IMPRESSION: Bilateral renal cysts similar to that seen on prior MRI examination. No acute obstructive changes are noted. Electronically Signed   By: Inez Catalina M.D.   On: 05/15/2015 16:29   Ir Fluoro Guide Cv Line Right  05/17/2015  INDICATION: Chronic kidney disease with early uremia. Need  to start hemodialysis. EXAM: FLUOROSCOPIC AND ULTRASOUND GUIDED PLACEMENT OF A TUNNELED DIALYSIS CATHETER Physician: Stephan Minister. Henn, MD FLUOROSCOPY TIME:  42 seconds, 13.2 mGy MEDICATIONS: 2 g Ancef. As antibiotic prophylaxis, Ancef was ordered pre-procedure and administered intravenously within one hour of incision. 0.5 mg versed, 25 mcg fentanyl. A radiology nurse monitored the patient for moderate sedation. ANESTHESIA/SEDATION: Moderate sedation time: 30 minutes PROCEDURE: Informed consent was obtained for placement of a tunneled dialysis catheter. The patient was placed supine on the interventional table. Ultrasound confirmed a patent right internal jugularvein. Ultrasound images were obtained for documentation. The right side of the neck was prepped and draped in a sterile fashion. The right side of the neck was anesthetized with 1% lidocaine. Maximal barrier sterile technique was utilized including caps, mask, sterile gowns, sterile gloves, sterile drape, hand hygiene and skin antiseptic. A small incision was made with #11 blade scalpel. A 21 gauge needle directed into the right internal jugular vein with ultrasound guidance. A micropuncture dilator set was placed. A 23 cm tip to cuff Palindrome catheter was selected. The skin below the right clavicle was anesthetized and a small incision was made with an #11 blade scalpel. A subcutaneous tunnel was formed to the vein dermatotomy site. The catheter was brought through the tunnel. The vein dermatotomy site was dilated to accommodate a peel-away sheath. The catheter was placed through the peel-away sheath and directed into the central venous structures. The tip of the catheter was placed at the superior cavoatrial junction with fluoroscopy. Fluoroscopic images were obtained for documentation. Both lumens were found to aspirate and flush well. The proper amount of heparin was flushed in both lumens. The vein dermatotomy site was closed using a single layer of  absorbable suture and Dermabond. The catheter was secured to the skin using Prolene suture. FINDINGS: Catheter tip at the superior cavoatrial junction. Estimated blood loss: Minimal COMPLICATIONS: None IMPRESSION: Successful placement of a right jugular tunneled dialysis catheter using ultrasound and fluoroscopic guidance. Electronically Signed   By: Markus Daft M.D.   On: 05/17/2015 16:23  Ir US Guide Vasc Access Right  05/17/2015  INDICATION: Chronic kidney disease with early uremia. Need to start hemodialysis. EXAM: FLUOROSCOPIC AND ULTRASOUND GUIDED PLACEMENT OF A TUNNELED DIALYSIS CATHETER Physician: Stephan Minister. Henn, MD FLUOROSCOPY TIME:  42 seconds, 13.2 mGy MEDICATIONS: 2 g Ancef. As antibiotic prophylaxis, Ancef was ordered pre-procedure and administered intravenously within one hour of incision. 0.5 mg versed, 25 mcg fentanyl. A radiology nurse monitored the patient for moderate sedation. ANESTHESIA/SEDATION: Moderate sedation time: 30 minutes PROCEDURE: Informed consent was obtained for placement of a tunneled dialysis catheter. The patient was placed supine on the interventional table. Ultrasound confirmed a patent right internal jugularvein. Ultrasound images were obtained for documentation. The right side of the neck was prepped and draped in a sterile fashion. The right side of the neck was anesthetized with 1% lidocaine. Maximal barrier sterile technique was utilized including caps, mask, sterile gowns, sterile gloves, sterile drape, hand hygiene and skin antiseptic. A small incision was made with #11 blade scalpel. A 21 gauge needle directed into the right internal jugular vein with ultrasound guidance. A micropuncture dilator set was placed. A 23 cm tip to cuff Palindrome catheter was selected. The skin below the right clavicle was anesthetized and a small incision was made with an #11 blade scalpel. A subcutaneous tunnel was formed to the vein dermatotomy site. The catheter was brought through the  tunnel. The vein dermatotomy site was dilated to accommodate a peel-away sheath. The catheter was placed through the peel-away sheath and directed into the central venous structures. The tip of the catheter was placed at the superior cavoatrial junction with fluoroscopy. Fluoroscopic images were obtained for documentation. Both lumens were found to aspirate and flush well. The proper amount of heparin was flushed in both lumens. The vein dermatotomy site was closed using a single layer of absorbable suture and Dermabond. The catheter was secured to the skin using Prolene suture. FINDINGS: Catheter tip at the superior cavoatrial junction. Estimated blood loss: Minimal COMPLICATIONS: None IMPRESSION: Successful placement of a right jugular tunneled dialysis catheter using ultrasound and fluoroscopic guidance. Electronically Signed   By: Markus Daft M.D.   On: 05/17/2015 16:23   Dg Chest Port 1 View  05/24/2015  CLINICAL DATA:  Shortness of breath. Finished hemodialysis 1 hour previously. EXAM: PORTABLE CHEST 1 VIEW COMPARISON:  05/13/2015. FINDINGS: Normal sized heart. Clear lungs. Interval right jugular catheter with its tip in the inferior aspect of the superior vena cava. No pneumothorax. Mild right basilar atelectasis and patchy opacity with mild progression. Clear left lung. Mild lower thoracic spine degenerative changes. IMPRESSION: Mild right basilar atelectasis and possible pneumonia. Electronically Signed   By: Claudie Revering M.D.   On: 05/24/2015 20:50    CBC  Recent Labs Lab 05/24/15 1400 05/25/15 0529 05/27/15 1000 05/28/15 0513 05/31/15 0456  WBC 14.8* 12.6* 15.3* 15.0* 5.6  HGB 8.7* 8.6* 8.3* 8.7* 7.5*  HCT 26.5* 28.0* 25.9* 27.1* 24.0*  PLT 153 173 141* 159 163  MCV 94.0 97.2 94.9 93.8 94.5  MCH 30.9 29.9 30.4 30.1 29.5  MCHC 32.8 30.7 32.0 32.1 31.3  RDW 15.5 15.9* 16.3* 16.0* 16.2*    Chemistries   Recent Labs Lab 05/27/15 1000 05/28/15 1030 05/29/15 0708 05/30/15 0720  05/31/15 0456  NA 129* 133* 134* 132* 131*  K 4.5 3.8 4.6 4.6 4.8  CL 94* 95* 93* 92* 91*  CO2 23 26 28 29 28   GLUCOSE 190* 216* 122* 90 107*  BUN 41* 36* 26* 18  36*  CREATININE 5.88* 5.22* 4.43* 3.94* 6.13*  CALCIUM 8.1* 8.2* 8.6* 8.7* 8.6*   ------------------------------------------------------------------------------------------------------------------ estimated creatinine clearance is 10.8 mL/min (by C-G formula based on Cr of 6.13). ------------------------------------------------------------------------------------------------------------------ No results for input(s): HGBA1C in the last 72 hours. ------------------------------------------------------------------------------------------------------------------ No results for input(s): CHOL, HDL, LDLCALC, TRIG, CHOLHDL, LDLDIRECT in the last 72 hours. ------------------------------------------------------------------------------------------------------------------ No results for input(s): TSH, T4TOTAL, T3FREE, THYROIDAB in the last 72 hours.  Invalid input(s): FREET3 ------------------------------------------------------------------------------------------------------------------ No results for input(s): VITAMINB12, FOLATE, FERRITIN, TIBC, IRON, RETICCTPCT in the last 72 hours.  Coagulation profile  Recent Labs Lab 05/27/15 0539 05/28/15 0513 05/29/15 0708 05/30/15 0720 05/31/15 0456  INR 2.51* 2.50* 3.10* 3.46* 3.32*    No results for input(s): DDIMER in the last 72 hours.  Cardiac Enzymes No results for input(s): CKMB, TROPONINI, MYOGLOBIN in the last 168 hours.  Invalid input(s): CK ------------------------------------------------------------------------------------------------------------------ Invalid input(s): [place]  05/29/15 2211 05/30/15 0759 05/30/15 1148 05/30/15 1705 05/30/15 2302 05/31/15 0807  GLUCAP 130* 93 133* 108* 166* 100*     Raena Pau M.D. [facility] 05/31/2015, 1:12 PM  Pager: [phone] Between 7am to 7pm - call Pager - [phone]  After 7pm go to [email or web address] - password [email or web address]  Call night coverage person covering after 7pm

## 2015-05-31 NOTE — Progress Notes (Addendum)
Patient and wife are concerned about patient's pain.  Wife called to talk about patients care.  Wife is concerned about cysts that are on kidney possiblly rupturing causing the pain.  Patient is very irritable and is impatient.  I bladder scanned patient because he stated "he had to pee" patient was unable to urinate.  Bladder scan was then performed with findings of only 96 ml of urine in the bladder.  Wife asked me to discuss this issue with the kidney doctor about this issue.   Paging Dr Tana Coast to discuss care.   5:01 PM Discussed patient situation with Dr Tana Coast.  Decided to perform a renal ultrasound.

## 2015-05-31 NOTE — Care Management Note (Signed)
Case Management Note  Patient Details  Name: Terry [name] MRN: [id number] Date of Birth: 07-03-1934  Subjective/Objective:    Per pt eval rec snf at this time, patient has become really deconditioned with receiving HD daily.  CSW aware and will speak to patient and wife.                Action/Plan:   Expected Discharge Date:                  Expected Discharge Plan:  Skilled Nursing Facility  In-House Referral:  Clinical Social Work  Discharge planning Services  CM Consult  Post Acute Care Choice:    Choice offered to:     DME Arranged:    DME Agency:     HH Arranged:    [place] Agency:     Status of Service:  Completed, signed off  Medicare Important Message Given:  Yes Date Medicare IM Given:    Medicare IM give by:    Date Additional Medicare IM Given:    Additional Medicare Important Message give by:     If discussed at [place] of Stay Meetings, dates discussed:    Additional Comments:  Zenon Mayo, RN 05/31/2015, 3:49 PM

## 2015-05-31 NOTE — Progress Notes (Signed)
CSW consulted to speak w/ pt regarding SNF at discharge.   Pt refused SNF. Pt is confident that [facility] [place] will be sufficient along w/ help from his wife.  CSW signing off.  Percell Locus Kaarin Pardy LCSWA [phone]

## 2015-05-31 NOTE — Progress Notes (Signed)
Patient is complaining of difficulty urinating.  Will bladder scan patient

## 2015-05-31 NOTE — Care Management Note (Signed)
Case Management Note  Patient Details  Name: NEUMAN JIAN MRN: [id number] Date of Birth: 1934/08/16  Subjective/Objective:  NCM and CSW spoke with paitent regarding pt eval rec snf, patient refusing snf and wants to go home, states his wife will be able to help him and CSW spoke with wife and she told CSW that she prefers for patient to come home as well.  Patient chose [facility] for [facility], [place], aide and Education officer, museum.  Referral made to [facility] with [facility] , soc will begin 24-48 hrs post dc. Patient is not ready for dc yet still getting HD daily.                  Action/Plan:   Expected Discharge Date:                  Expected Discharge Plan:  Skilled Nursing Facility  In-House Referral:  Clinical Social Work  Discharge planning Services  CM Consult  Post Acute Care Choice:  Home Health Choice offered to:  Patient  DME Arranged:    DME Agency:     HH Arranged:  RN, PT, Nurse's Aide, Social Work [employer] Agency:  [place]  Status of Service:  Completed, signed off  Medicare Important Message Given:  Yes Date Medicare IM Given:    Medicare IM give by:    Date Additional Medicare IM Given:    Additional Medicare Important Message give by:     If discussed at [place] of Stay Meetings, dates discussed:    Additional Comments:  Zenon Mayo, RN 05/31/2015, 4:47 PM

## 2015-06-01 ENCOUNTER — Inpatient Hospital Stay (HOSPITAL_COMMUNITY): Payer: Medicare PPO

## 2015-06-01 LAB — PROTIME-INR
INR: 2.49 — ABNORMAL HIGH (ref 0.00–1.49)
PROTHROMBIN TIME: 26.6 s — AB (ref 11.6–15.2)

## 2015-06-01 LAB — GLUCOSE, CAPILLARY
GLUCOSE-CAPILLARY: 112 mg/dL — AB (ref 65–99)
GLUCOSE-CAPILLARY: 103 mg/dL — AB (ref 65–99)
GLUCOSE-CAPILLARY: 121 mg/dL — AB (ref 65–99)
Glucose-Capillary: 90 mg/dL (ref 65–99)
Glucose-Capillary: 87 mg/dL (ref 65–99)

## 2015-06-01 MED ORDER — WARFARIN SODIUM 2.5 MG PO TABS
2.5000 mg | ORAL_TABLET | Freq: Once | ORAL | Status: AC
  Administered 2015-06-01: 2.5 mg via ORAL
  Filled 2015-06-01: qty 1

## 2015-06-01 MED ORDER — MINERAL OIL RE ENEM
1.0000 | ENEMA | Freq: Once | RECTAL | Status: AC
  Administered 2015-06-01: 1 via RECTAL
  Filled 2015-06-01: qty 1

## 2015-06-01 MED ORDER — POLYETHYLENE GLYCOL 3350 17 G PO PACK
17.0000 g | PACK | Freq: Every day | ORAL | Status: DC
  Administered 2015-06-01 – 2015-06-02 (×2): 17 g via ORAL
  Filled 2015-06-01 (×2): qty 1

## 2015-06-01 MED ORDER — DOCUSATE SODIUM 100 MG PO CAPS
100.0000 mg | ORAL_CAPSULE | Freq: Two times a day (BID) | ORAL | Status: DC
  Administered 2015-06-01 – 2015-06-02 (×3): 100 mg via ORAL
  Filled 2015-06-01 (×4): qty 1

## 2015-06-01 MED ORDER — LACTULOSE 10 GM/15ML PO SOLN
30.0000 g | Freq: Two times a day (BID) | ORAL | Status: DC | PRN
  Administered 2015-06-01: 30 g via ORAL
  Filled 2015-06-01: qty 45

## 2015-06-01 NOTE — Progress Notes (Signed)
[facility]                                                                              Patient Demographics  Terry [name], is a 80 y.o. male, DOB - [birth year], [id number]  Admit date - 05/13/2015   Admitting Physician Edwin Dada, MD  Outpatient Primary MD for the patient is [name], DO  LOS - 18   Chief Complaint  Patient presents with  . Cough  . Shortness of Breath       Brief HPI   80 year old male with a history of COPD, hypertension, CAD/CABG, CKD stage IV, atrial fibrillation diagnosed October 2015 on apixaban presented with one-week history of worsening cough and shortness of breath. The patient was seen by his primary care provider 4 days prior to admission and prescribed Augmentin and prednisone without improvement. He presented to the emergency department where he was noted to have oxygen saturation of 80% on room air and found to be in respiratory distress. The patient was started on doxycycline and Solu-Medrol. The patient has also been describing symptoms of orthopnea and PND with increasing abdominal girth and lower extremity edema for the past week prior to admission.  After admission, the patient was started on intravenous steroids. His breathing did not improve. He subsequently started on intravenous furosemide for volume excess. Ultimately, the patient was declared ESRD and dialysis catheter was placed and dialysis initiated on 05/17/2015.   Assessment & Plan    Principal Problem: Acute respiratory failure with hypoxia-  - Secondary to CHF and COPD exacerbation, volume overload, O2 sats 94% on 4 L  - continue Pulmonary hygiene and treatment for COPD/HD for volume control  - Currently on IV Vanco and Zosyn, repeat chest x-ray today   Active problems Acute diastolic CHF  - remains clinically volume overloaded, on back-to-back hemodialysis. Furosemide discontinued as patient is started on hemodialysis.  -2-D echo on  05/16/15 showed EF 55-60%, trivial MR/TR - Weight 208 pounds on 11/22> 192 -Volume now managed with HD, low-sodium diet however BP also limiting factor   Acute on chronic renal failure (CKD 4-5), Now new on ESRD started on HD -Previous Baseline creatinine 3.2-3.4. Nephrology and vascular surgery was consulted. Patient underwent right AV fistula placement on 05/21/15. PermCath was placed by radiology on 05/17/15 -Patient started on hemodialysis, needs Klonopin 1 mg prior to HD for anxiety and Robaxin for cramping.  - CLIPPED with [facility] HD outpatient unit but not ready yet - PT recommending skilled nursing facility, very deconditioned, wife works during the day, discussed with social work, however patient refusing skilled nursing facility - Having difficulty urinating, renal ultrasound was done as patient and his wife were worried about "rupturing cyst". Renal ultrasound did show multiple small renal cysts, no hydronephrosis or renal obstruction or any hemorrhage.[name]  COPD exacerbation  -Patient has >60 pk years, completed steroid tapering, continue bronchodilators  Atrial fibrillation  -CHADS-VASc = 6 -Discontinued apixaban due to worsening renal function/new ESRD - Continue Coumadin and will arrange follow-up appointment with cardiology for INR at discharge  -Patient is on metoprolol tartrate for rate control however this  is also causing hypotension and limiting factor for hemodialysis. Per cardiology no other options at this time, continue metoprolol and midodrine.   hypotension with history of Hypertension -Continue metoprolol, midodrine -HD helping with volume and BP control  Diabetes mellitus type 2 -Checked hemoglobin A1c--6.6, pt agreeable to start po meds upon d/c (low dose glipizide or glyburide) -NovoLog sliding scale while inpatient   Coronary artery disease -Cardiac cath 11/30/1997. Mild distal Left main stenosis, 25% proximal LAD, minor irregularities  Circumflex, 95% mid RCA stenosis treated with 3.5 x 32 mm bare metal stent -no angina presently   Hyponatremia -Secondary to worsening renal function and fluid overload, improving with HD  Numbness right fourth and fifth finger - Recommend outpatient workup for carpal tunnel syndrome or right shoulder MRI unless numbness and tingling worsens or any focal weakness. Patient agreeable with the plan.   Code Status: Full code  Family Communication: Discussed in detail with the patient, all imaging results, lab results explained to the patient.   Disposition Plan: - PT recommending skilled nursing facility, very deconditioned, wife works during the day, discussed with social work however patient declined skilled nursing facility  Time Spent in minutes  42minutes  Procedures  HD  Consults   Renal Cardiology  DVT Prophylaxis  Lovenox  Medications  Scheduled Meds: . atorvastatin  20 mg Oral q1800  . benzonatate  200 mg Oral TID  . budesonide-formoterol  2 puff Inhalation BID  . darbepoetin (ARANESP) injection - DIALYSIS  200 mcg Intravenous Q Fri-HD  . docusate sodium  100 mg Oral BID  . doxercalciferol  1 mcg Intravenous Q M,W,F-HD  . [START ON 06/03/2015] ferric gluconate (FERRLECIT/NULECIT) IV  125 mg Intravenous Q M,W,F-HD  . insulin aspart  0-15 Units Subcutaneous TID WC  . insulin aspart  0-5 Units Subcutaneous QHS  . ipratropium-albuterol  3 mL Nebulization TID  . magic mouthwash  5 mL Oral QID  . metoprolol  25 mg Oral BID  . midodrine  10 mg Oral TID WC  . mineral oil  1 enema Rectal Once  . piperacillin-tazobactam (ZOSYN)  IV  2.25 g Intravenous 3 times per day  . polyethylene glycol  17 g Oral Daily  . sodium chloride  500 mL Intravenous Once  . sodium chloride  3 mL Intravenous Q12H  . tiotropium  18 mcg Inhalation Daily  . Warfarin - Pharmacist Dosing Inpatient   Does not apply q1800   Continuous Infusions: . sodium chloride 10 mL/hr at 05/21/15 0946   PRN  Meds:.albuterol, clonazePAM, guaiFENesin-dextromethorphan, HYDROcodone-acetaminophen, lactulose, methocarbamol, morphine injection, ondansetron **OR** ondansetron (ZOFRAN) IV, oxyCODONE   Antibiotics   Anti-infectives    Start     Dose/Rate Route Frequency Ordered Stop   05/31/15 1400  vancomycin (VANCOCIN) IVPB 1000 mg/200 mL premix     1,000 mg 200 mL/hr over 60 Minutes Intravenous  Once 05/31/15 1305 05/31/15 1421   05/31/15 1200  vancomycin (VANCOCIN) IVPB 1000 mg/200 mL premix  Status:  Discontinued     1,000 mg 200 mL/hr over 60 Minutes Intravenous Every M-W-F (Hemodialysis) 05/27/15 1351 05/30/15 1056   05/31/15 1000  vancomycin (VANCOCIN) IVPB 1000 mg/200 mL premix  Status:  Discontinued     1,000 mg 200 mL/hr over 60 Minutes Intravenous  Once 05/31/15 0949 05/31/15 1305   05/27/15 1200  vancomycin (VANCOCIN) IVPB 1000 mg/200 mL premix  Status:  Discontinued     1,000 mg 200 mL/hr over 60 Minutes Intravenous Every M-W-F (Hemodialysis) 05/26/15 1433 05/27/15  1351   05/26/15 1500  piperacillin-tazobactam (ZOSYN) IVPB 2.25 g     2.25 g 100 mL/hr over 30 Minutes Intravenous 3 times per day 05/26/15 1433     05/26/15 1445  vancomycin (VANCOCIN) 2,000 mg in sodium chloride 0.9 % 500 mL IVPB     2,000 mg 250 mL/hr over 120 Minutes Intravenous NOW 05/26/15 1433 05/26/15 1904   05/20/15 0600  ceFAZolin (ANCEF) IVPB 1 g/50 mL premix    Comments:  Send with pt to OR   1 g 100 mL/hr over 30 Minutes Intravenous To ShortStay Surgical 05/20/15 1011 05/21/15 0600   05/17/15 1530  [MAR Hold]  ceFAZolin (ANCEF) IVPB 2 g/50 mL premix     (MAR Hold since 05/21/15 0912)   2 g 100 mL/hr over 30 Minutes Intravenous  Once 05/17/15 1518 05/21/15 1059   05/17/15 1448  ceFAZolin (ANCEF) 2-3 GM-% IVPB SOLR    Comments:  Desiree Hane   : cabinet override      05/17/15 1448 05/17/15 1519   05/14/15 1000  doxycycline (VIBRA-TABS) tablet 100 mg     100 mg Oral Every 12 hours 05/14/15 0116 05/19/15  2218   05/13/15 2145  azithromycin (ZITHROMAX) 500 mg in dextrose 5 % 250 mL IVPB     500 mg 250 mL/hr over 60 Minutes Intravenous  Once 05/13/15 2137 05/13/15 2258        Subjective:   Terry [name] was seen and examined today. Very irritable this morning, " I can't pee, i am constipated, no body helps me".  Still very edematous. Patient denies dizziness, abdominal pain, nausea or vomiting. Also complaining of right fourth and fifth finger numbness, states for the last 48-72hrs but did not tell anyone. States he had picked up something heavy with his right arm prior to admission which might be causing his symptoms.   Objective:   Blood pressure 136/48, pulse 86, temperature 98.2 F (36.8 C), temperature source Oral, resp. rate 16, height 5\' 8"  (1.727 m), weight 87.3 kg (192 lb 7.4 oz), SpO2 94 %.  Wt Readings from Last 3 Encounters:  06/01/15 87.3 kg (192 lb 7.4 oz)  05/01/15 93.895 kg (207 lb)  03/20/15 94.348 kg (208 lb)     Intake/Output Summary (Last 24 hours) at 06/01/15 1153 Last data filed at 05/31/15 1626  Gross per 24 hour  Intake    290 ml  Output   2754 ml  Net  -2464 ml    Exam  General: Alert and oriented x 3, NAD, upset   HEENT:  PERRLA, EOM  Neck: Supple, no JVD, no masses  CVS: S1 S2 clear, RRR  Respiratory: Clear to auscultation bilaterally  Abdomen: Soft, nontender, nondistended, + bowel sounds  Ext: no cyanosis clubbing, 2+ pitting edema  Neuro: no new deficits  Skin: No rashes  Psych: Normal affect and demeanor, alert and oriented x3    Data Review   Micro Results No results found for this or any previous visit (from the past 240 hour(s)).  Radiology Reports Dg Chest 2 View  05/26/2015  CLINICAL DATA:  History of pneumonia.  Shortness of breath. EXAM: CHEST  2 VIEW COMPARISON:  05/24/2015 FINDINGS: Large bore right internal jugular approach central venous catheter is stable. Cardiomediastinal silhouette is normal. Mediastinal  contours appear intact. There is no evidence of pneumothorax. There is a right pleural effusion with linear airspace opacity in the right lower lobe likely representing atelectasis. The left costophrenic angle is incompletely visualized, and therefore  small left pleural effusion cannot be excluded. Osseous structures are without acute abnormality. Soft tissues are grossly normal. IMPRESSION: Right pleural effusion with probably compressive atelectasis in the right lower lobe. Left costophrenic angle incompletely visualized, and therefore small left pleural effusion cannot be excluded. Electronically Signed   By: Fidela Salisbury M.D.   On: 05/26/2015 11:09   Dg Chest 2 View  05/13/2015  CLINICAL DATA:  80 year old with 1 week history of productive cough and severe shortness of breath. Former smoker with current history of COPD. EXAM: CHEST  2 VIEW COMPARISON:  05/28/2014 and earlier. FINDINGS: AP erect and lateral imaging was performed. Cardiac silhouette mildly enlarged with a right coronary artery stent. Thoracic aorta atherosclerotic, unchanged. Prominent central pulmonary arteries, unchanged. Scarring in the lower lobes, right greater than left, unchanged. Hyperinflation, emphysematous changes in the upper lobes, and mild central peribronchial thickening, unchanged. No new pulmonary parenchymal abnormalities. Degenerative disc disease and spondylosis involving the thoracic spine. IMPRESSION: COPD/emphysema. Scarring in the lower lobes, right greater than left. No acute cardiopulmonary disease. Stable examination. Electronically Signed   By: Evangeline Dakin M.D.   On: 05/13/2015 21:03   US Renal  05/31/2015  CLINICAL DATA:  Left flank pain. EXAM: RENAL / URINARY TRACT ULTRASOUND COMPLETE COMPARISON:  Ultrasound of May 15, 2015. FINDINGS: Right Kidney: Length: 10 cm. Multiple small cysts are again noted. Echogenicity within normal limits. No mass or hydronephrosis visualized. Left Kidney: Length:  12.5 cm. Multiple small cysts are again noted. Echogenicity within normal limits. No mass or hydronephrosis visualized. Bladder: Not visualized as patient was in sitting position, and refused to lay back in supine position. IMPRESSION: Multiple small renal cysts are noted bilaterally. No hydronephrosis or renal obstruction is noted. Electronically Signed   By: Marijo Conception, M.D.   On: 05/31/2015 21:02   US Renal  05/15/2015  CLINICAL DATA:  Acute renal injury EXAM: RENAL / URINARY TRACT ULTRASOUND COMPLETE COMPARISON:  10/24/2014 FINDINGS: Right Kidney: Length: 14.8 cm. Multiple cysts are noted. The largest of these measures 2.2 cm in greatest dimension. Left Kidney: Length: 14.0 cm. Multiple cysts are noted. The largest of these measures 3.1 cm in greatest dimension. Bladder: Appears normal for degree of bladder distention. IMPRESSION: Bilateral renal cysts similar to that seen on prior MRI examination. No acute obstructive changes are noted. Electronically Signed   By: Inez Catalina M.D.   On: 05/15/2015 16:29   Ir Fluoro Guide Cv Line Right  05/17/2015  INDICATION: Chronic kidney disease with early uremia. Need to start hemodialysis. EXAM: FLUOROSCOPIC AND ULTRASOUND GUIDED PLACEMENT OF A TUNNELED DIALYSIS CATHETER Physician: Stephan Minister. Henn, MD FLUOROSCOPY TIME:  42 seconds, 13.2 mGy MEDICATIONS: 2 g Ancef. As antibiotic prophylaxis, Ancef was ordered pre-procedure and administered intravenously within one hour of incision. 0.5 mg versed, 25 mcg fentanyl. A radiology nurse monitored the patient for moderate sedation. ANESTHESIA/SEDATION: Moderate sedation time: 30 minutes PROCEDURE: Informed consent was obtained for placement of a tunneled dialysis catheter. The patient was placed supine on the interventional table. Ultrasound confirmed a patent right internal jugularvein. Ultrasound images were obtained for documentation. The right side of the neck was prepped and draped in a sterile fashion. The right  side of the neck was anesthetized with 1% lidocaine. Maximal barrier sterile technique was utilized including caps, mask, sterile gowns, sterile gloves, sterile drape, hand hygiene and skin antiseptic. A small incision was made with #11 blade scalpel. A 21 gauge needle directed into the right internal jugular vein with  ultrasound guidance. A micropuncture dilator set was placed. A 23 cm tip to cuff Palindrome catheter was selected. The skin below the right clavicle was anesthetized and a small incision was made with an #11 blade scalpel. A subcutaneous tunnel was formed to the vein dermatotomy site. The catheter was brought through the tunnel. The vein dermatotomy site was dilated to accommodate a peel-away sheath. The catheter was placed through the peel-away sheath and directed into the central venous structures. The tip of the catheter was placed at the superior cavoatrial junction with fluoroscopy. Fluoroscopic images were obtained for documentation. Both lumens were found to aspirate and flush well. The proper amount of heparin was flushed in both lumens. The vein dermatotomy site was closed using a single layer of absorbable suture and Dermabond. The catheter was secured to the skin using Prolene suture. FINDINGS: Catheter tip at the superior cavoatrial junction. Estimated blood loss: Minimal COMPLICATIONS: None IMPRESSION: Successful placement of a right jugular tunneled dialysis catheter using ultrasound and fluoroscopic guidance. Electronically Signed   By: Markus Daft M.D.   On: 05/17/2015 16:23   Ir US Guide Vasc Access Right  05/17/2015  INDICATION: Chronic kidney disease with early uremia. Need to start hemodialysis. EXAM: FLUOROSCOPIC AND ULTRASOUND GUIDED PLACEMENT OF A TUNNELED DIALYSIS CATHETER Physician: Stephan Minister. Henn, MD FLUOROSCOPY TIME:  42 seconds, 13.2 mGy MEDICATIONS: 2 g Ancef. As antibiotic prophylaxis, Ancef was ordered pre-procedure and administered intravenously within one hour of  incision. 0.5 mg versed, 25 mcg fentanyl. A radiology nurse monitored the patient for moderate sedation. ANESTHESIA/SEDATION: Moderate sedation time: 30 minutes PROCEDURE: Informed consent was obtained for placement of a tunneled dialysis catheter. The patient was placed supine on the interventional table. Ultrasound confirmed a patent right internal jugularvein. Ultrasound images were obtained for documentation. The right side of the neck was prepped and draped in a sterile fashion. The right side of the neck was anesthetized with 1% lidocaine. Maximal barrier sterile technique was utilized including caps, mask, sterile gowns, sterile gloves, sterile drape, hand hygiene and skin antiseptic. A small incision was made with #11 blade scalpel. A 21 gauge needle directed into the right internal jugular vein with ultrasound guidance. A micropuncture dilator set was placed. A 23 cm tip to cuff Palindrome catheter was selected. The skin below the right clavicle was anesthetized and a small incision was made with an #11 blade scalpel. A subcutaneous tunnel was formed to the vein dermatotomy site. The catheter was brought through the tunnel. The vein dermatotomy site was dilated to accommodate a peel-away sheath. The catheter was placed through the peel-away sheath and directed into the central venous structures. The tip of the catheter was placed at the superior cavoatrial junction with fluoroscopy. Fluoroscopic images were obtained for documentation. Both lumens were found to aspirate and flush well. The proper amount of heparin was flushed in both lumens. The vein dermatotomy site was closed using a single layer of absorbable suture and Dermabond. The catheter was secured to the skin using Prolene suture. FINDINGS: Catheter tip at the superior cavoatrial junction. Estimated blood loss: Minimal COMPLICATIONS: None IMPRESSION: Successful placement of a right jugular tunneled dialysis catheter using ultrasound and  fluoroscopic guidance. Electronically Signed   By: Markus Daft M.D.   On: 05/17/2015 16:23   Dg Chest Port 1 View  05/24/2015  CLINICAL DATA:  Shortness of breath. Finished hemodialysis 1 hour previously. EXAM: PORTABLE CHEST 1 VIEW COMPARISON:  05/13/2015. FINDINGS: Normal sized heart. Clear lungs. Interval right jugular catheter  with its tip in the inferior aspect of the superior vena cava. No pneumothorax. Mild right basilar atelectasis and patchy opacity with mild progression. Clear left lung. Mild lower thoracic spine degenerative changes. IMPRESSION: Mild right basilar atelectasis and possible pneumonia. Electronically Signed   By: Claudie Revering M.D.   On: 05/24/2015 20:50    CBC  Recent Labs Lab 05/27/15 1000 05/28/15 0513 05/31/15 0456  WBC 15.3* 15.0* 5.6  HGB 8.3* 8.7* 7.5*  HCT 25.9* 27.1* 24.0*  PLT 141* 159 163  MCV 94.9 93.8 94.5  MCH 30.4 30.1 29.5  MCHC 32.0 32.1 31.3  RDW 16.3* 16.0* 16.2*    Chemistries   Recent Labs Lab 05/27/15 1000 05/28/15 1030 05/29/15 0708 05/30/15 0720 05/31/15 0456  NA 129* 133* 134* 132* 131*  K 4.5 3.8 4.6 4.6 4.8  CL 94* 95* 93* 92* 91*  CO2 23 26 28 29 28   GLUCOSE 190* 216* 122* 90 107*  BUN 41* 36* 26* 18 36*  CREATININE 5.88* 5.22* 4.43* 3.94* 6.13*  CALCIUM 8.1* 8.2* 8.6* 8.7* 8.6*   ------------------------------------------------------------------------------------------------------------------ estimated creatinine clearance is 10.7 mL/min (by C-G formula based on Cr of 6.13). ------------------------------------------------------------------------------------------------------------------ No results for input(s): HGBA1C in the last 72 hours. ------------------------------------------------------------------------------------------------------------------ No results for input(s): CHOL, HDL, LDLCALC, TRIG, CHOLHDL, LDLDIRECT in the last 72  hours. ------------------------------------------------------------------------------------------------------------------ No results for input(s): TSH, T4TOTAL, T3FREE, THYROIDAB in the last 72 hours.  Invalid input(s): FREET3 ------------------------------------------------------------------------------------------------------------------ No results for input(s): VITAMINB12, FOLATE, FERRITIN, TIBC, IRON, RETICCTPCT in the last 72 hours.  Coagulation profile  Recent Labs Lab 05/28/15 0513 05/29/15 0708 05/30/15 0720 05/31/15 0456 06/01/15 0517  INR 2.50* 3.10* 3.46* 3.32* 2.49*    No results for input(s): DDIMER in the last 72 hours.  Cardiac Enzymes No results for input(s): CKMB, TROPONINI, MYOGLOBIN in the last 168 hours.  Invalid input(s): CK ------------------------------------------------------------------------------------------------------------------ Invalid input(s): [place]  05/31/15 0807 05/31/15 1413 05/31/15 1705 05/31/15 2138 06/01/15 0627 06/01/15 0845  GLUCAP 100* 105* 110* 106* 90 32     Jaimarie Rapozo M.D. [facility] 06/01/2015, 11:53 AM  Pager: [phone] Between 7am to 7pm - call Pager - [phone]  After 7pm go to [email or web address] - password [email or web address]  Call night coverage person covering after 7pm

## 2015-06-01 NOTE — Progress Notes (Signed)
[facility] Rounding Note  Subjective:  Events of 24 hours: Notes/events reviewed HD yesterday - weight down 3 kg - better tolerated than on earlier treatments this week Had urge to void and could not - bladder scan only 96 cc urine Next note refers to "pain" and renal US done - unchanged appearance of bilateral renal cysts Pt refused SNF placement despite deconditioning Next note refers to confusion (I have not prev noted that) and tussionex stopped  This AM he complains of severe constipation "I am sitting on fecal material" Along with the constipation discomfort - says "need to pee and can't  Objective:  Physical Examination BP 136/48 mmHg  Pulse 86  Temp(Src) 98.2 F (36.8 C) (Oral)  Resp 16  Ht 5\' 8"  (1.727 m)  Wt 87.3 kg (192 lb 7.4 oz)  BMI 29.27 kg/m2  SpO2 94% Weight trending:(Max weight this hosp 100.6 kg) 12/5 pre wt 90.6 Post wt 88.1 12/6 pre wt 92.3 Post wt 87.5 12/7 pre wt 88.1 Post wt 88 12/9 pre wt 89.4 Post wt 86.5  Still requiring O2 Coarse BS/rhonchi - deep breath induces cough TDC dressing intact Sitting in the chair, leaned over bedside table, with his trousers around his ankles Abdomen obese 2+ pitting edema bilateral LE's still very doughy but somewhat less R AVF with hematoma at incision site, + bruit. Hand warm, cap refill good, no cyanosis  Lab Results:  Recent Labs  05/31/15 0456  WBC 5.6  HGB 7.5*  HCT 24.0*  PLT 163   Lab Results  Component Value Date   INR 2.49* 06/01/2015   INR 3.32* 05/31/2015   INR 3.46* 05/30/2015     Recent Labs  05/30/15 0720 05/31/15 0456  NA 132* 131*  K 4.6 4.8  CL 92* 91*  CO2 29 28  GLUCOSE 90 107*  BUN 18 36*  CREATININE 3.94* 6.13*  CALCIUM 8.7* 8.6*    Scheduled Medications: . atorvastatin  20 mg Oral q1800  . benzonatate  200 mg Oral TID  . budesonide-formoterol  2 puff Inhalation BID  . darbepoetin (ARANESP) injection - DIALYSIS  200 mcg Intravenous Q Fri-HD  .  docusate sodium  100 mg Oral BID  . doxercalciferol  1 mcg Intravenous Q M,W,F-HD  . [START ON 06/03/2015] ferric gluconate (FERRLECIT/NULECIT) IV  125 mg Intravenous Q M,W,F-HD  . insulin aspart  0-15 Units Subcutaneous TID WC  . insulin aspart  0-5 Units Subcutaneous QHS  . ipratropium-albuterol  3 mL Nebulization TID  . magic mouthwash  5 mL Oral QID  . metoprolol  25 mg Oral BID  . midodrine  10 mg Oral TID WC  . piperacillin-tazobactam (ZOSYN)  IV  2.25 g Intravenous 3 times per day  . polyethylene glycol  17 g Oral Daily  . sodium chloride  500 mL Intravenous Once  . sodium chloride  3 mL Intravenous Q12H  . tiotropium  18 mcg Inhalation Daily  . Warfarin - Pharmacist Dosing Inpatient   Does not apply q1800    Background 80 with over 37yr HTN, DJD, severe COPD, CAD, AF, prior ureteral stents, bilateral renal cysts, and known advancing late stage 4 CKD. Baseline Cr 3.9. Adm with cough, SOB, edema, worsening renal failure requiring initiation of dialysis --->new ESRD.  Assessment/Plan:  1. New start ESRD, dialysis dependent. 1st HD  11/26. S/p AVF RUA Dr. Scot Dock 11/29. [facility] 11/25.   CLIPPED for [facility] MWF 2nd shift. Probs with low BP and fluid removal.  Did get  3L off with Friday HD.  Has to stay on BB per cards for permanent AF per cards. Will just have to continue with midodrine. HD Monday 2. Anemia on Darbe 200 QThurs Last received 12/2. Since MWF changed to QFriday. TSat 7% on 12/6. Hb down to 7.5. Rec'd feraheme earlier in the admission but tsat remains low. Started Ferrlecit with HD on 12/9 - total dose planned will be 1 gram 3. HPTH on vit D - PTH 159 on 11/23. Hectorol 1 mcg TIW on MWF with HD.  4. COPD longstanding - bronchodilators 5. Diastolic heart failure. LVEF 55-60.  6. AFib -- coumadin. Pharmacy dosing. Metoprolol for rate control - Cards says no amio since AF is permanent and not paroxysmal so will have to continue the BB. He will need to have his coumadin followed  either by PCP or cardiology at discharge - dialysis unit is not set up to manage  7. DJD 8. CHF/Vol overload - having problems getting volume off.  Doing the best we can given BP limitations. Highest weight this admission ~ 99.8 kg.  We are down in the 86.5-87.5  range now but we are still nowhere near a dry weight. Got 3 liters off on Friday with HD - better than usual. 9. DM2 10. Cough with CXR 12/4 compressive atelectasis right lung/leukocytosis. Being treated as HCAP. Still on vanco and zosyn. His sputum is still discolored. Still coughing. Still O2 dependent. Repeat CXR. 11. MRSA PCR + 12. Obstipation/dry hard stool - mineral oil enema today and prn. Lactulose. 13. Urgency - if unable to void after good BM, nurses instructed to scan his bladder. 50. Dispo - He is refusing SNF. Very deconditioned.  Terry Maes, MD [facility] [phone] Pager 06/01/2015, 10:51 AM

## 2015-06-01 NOTE — Progress Notes (Signed)
Pt is complaining of numbness in his right fore fingers and also soreness in the right side of his upper back below the neck. NP Rogue Bussing, on call MD was notified. He said he would discuss with the team rounding in the morning. Pt will continue to be monitored.

## 2015-06-01 NOTE — Progress Notes (Signed)
ANTICOAGULATION CONSULT NOTE - Follow Up Consult  Pharmacy Consult for warfarin Indication: atrial fibrillation  Allergies  Allergen Reactions  . Yellow Dyes (Non-Tartrazine) Other (See Comments)    Burns arms Cough medications (tussionex and benzonatate ok)  . Levaquin [Levofloxacin In D5w] Itching    Patient Measurements: Height: 5\' 8"  (172.7 cm) Weight: 192 lb 7.4 oz (87.3 kg) IBW/kg (Calculated) : 68.4  Vital Signs: Temp: 98.2 F (36.8 C) (12/10 0539) Temp Source: Oral (12/10 0539) BP: 136/48 mmHg (12/10 0950) Pulse Rate: 86 (12/10 0539)  Labs:  Recent Labs  05/30/15 0720 05/31/15 0456 06/01/15 0517  HGB  --  7.5*  --   HCT  --  24.0*  --   PLT  --  163  --   LABPROT 34.1* 33.0* 26.6*  INR 3.46* 3.32* 2.49*  CREATININE 3.94* 6.13*  --     Estimated Creatinine Clearance: 10.7 mL/min (by C-G formula based on Cr of 6.13).  Assessment:  80 y/o M with CKD V on apixaban PTA now transitioned to warfarin due to ESRD. INR 3.46>3.32>2.49. Hgb 7.5, plts 163  Goal of Therapy:  INR 2-3 Monitor platelets by anticoagulation protocol: Yes   Plan:  Warfarin 2.5 mg po x 1 tonight Daily INR, CBC Monitor for S&S of bleed  Angela Burke, PharmD Pharmacy Resident Pager: [phone] 06/01/2015,1:38 PM

## 2015-06-01 NOTE — Progress Notes (Signed)
Pt has Tussionex ordered for cough. Pt's wife expressed concern that he was "a little off", slightly confused. She said he has been off for 2-3 days. She asked that we inform the MD before we give this medication because she was concerned it might be the cause of the little confusion he was having. MD was notified about wife's concern. Pt was slightly agitated at the wife's statement about him being confused. MD changed his orders to Robitussin DM. Medication was administered as ordered. The medication was effective, as patient coughed less during the night and was able to get some rest. We will continue to monitor.

## 2015-06-01 NOTE — Progress Notes (Signed)
Pt wife c/o of his husband's penis being swollen, pt refused to let me look at it, md on made aware will continue to monitor for any changes

## 2015-06-02 LAB — PROTIME-INR
INR: 2.18 — ABNORMAL HIGH (ref 0.00–1.49)
PROTHROMBIN TIME: 24.1 s — AB (ref 11.6–15.2)

## 2015-06-02 LAB — RENAL FUNCTION PANEL
ALBUMIN: 3.4 g/dL — AB (ref 3.5–5.0)
Anion gap: 13 (ref 5–15)
BUN: 38 mg/dL — AB (ref 6–20)
CHLORIDE: 92 mmol/L — AB (ref 101–111)
CO2: 27 mmol/L (ref 22–32)
CREATININE: 7.06 mg/dL — AB (ref 0.61–1.24)
Calcium: 8.9 mg/dL (ref 8.9–10.3)
GFR calc Af Amer: 8 mL/min — ABNORMAL LOW (ref >60)
GFR, EST NON AFRICAN AMERICAN: 7 mL/min — AB (ref >60)
Glucose, Bld: 93 mg/dL (ref 65–99)
Phosphorus: 4.4 mg/dL (ref 2.5–4.6)
Potassium: 4 mmol/L (ref 3.5–5.1)
Sodium: 132 mmol/L — ABNORMAL LOW (ref 135–145)

## 2015-06-02 LAB — GLUCOSE, CAPILLARY
GLUCOSE-CAPILLARY: 80 mg/dL (ref 65–99)
GLUCOSE-CAPILLARY: 122 mg/dL — AB (ref 65–99)
Glucose-Capillary: 129 mg/dL — ABNORMAL HIGH (ref 65–99)
Glucose-Capillary: 121 mg/dL — ABNORMAL HIGH (ref 65–99)

## 2015-06-02 LAB — URINALYSIS, ROUTINE W REFLEX MICROSCOPIC
GLUCOSE, UA: 100 mg/dL — AB
Ketones, ur: 15 mg/dL — AB
Nitrite: NEGATIVE
SPECIFIC GRAVITY, URINE: 1.031 — AB (ref 1.005–1.030)
pH: 5 (ref 5.0–8.0)

## 2015-06-02 LAB — URINE MICROSCOPIC-ADD ON: BACTERIA UA: NONE SEEN

## 2015-06-02 MED ORDER — DOCUSATE SODIUM 100 MG PO CAPS: 200.0000 mg | ORAL_CAPSULE | Freq: Every day | ORAL | Status: DC

## 2015-06-02 MED ORDER — MINERAL OIL RE ENEM
1.0000 | ENEMA | Freq: Once | RECTAL | Status: DC
  Filled 2015-06-02: qty 1

## 2015-06-02 MED ORDER — LACTULOSE 10 GM/15ML PO SOLN
30.0000 g | Freq: Two times a day (BID) | ORAL | Status: DC
  Administered 2015-06-02: 30 g via ORAL
  Filled 2015-06-02: qty 45

## 2015-06-02 MED ORDER — WARFARIN SODIUM 7.5 MG PO TABS
7.5000 mg | ORAL_TABLET | Freq: Once | ORAL | Status: AC
  Administered 2015-06-02: 7.5 mg via ORAL
  Filled 2015-06-02: qty 1

## 2015-06-02 NOTE — Progress Notes (Signed)
[facility] Rounding Note  Subjective:  Events of 24 hours:  Notes/events reviewed Yesterday complained of severe constipation Had enema, laxatives and had BM but wants regular laxatives (OK) Wife concerned that "penis is swollen" (he still has a lot of residual edema everywhere) Continues to refuse SNF Complaining of numbness and coolness to pinky and ring finger (this is his access arm). Will need to as VVS to discern if related to his AVF Feels urge to void - severe - can't - last time the was the case   Objective:  Physical Examination BP 137/50 mmHg  Pulse 76  Temp(Src) 98.2 F (36.8 C) (Oral)  Resp 18  Ht 5\' 8"  (1.727 m)  Wt 87.3 kg (192 lb 7.4 oz)  BMI 29.27 kg/m2  SpO2 96% Weight trending:(Max weight this hosp 100.6 kg) 12/5 pre wt 90.6 Post wt 88.1 12/6 pre wt 92.3 Post wt 87.5 12/7 pre wt 88.1 Post wt 88 12/9 pre wt 89.4 Post wt 86.5  Still requires O2 Coarse BS/rhonchi - deep breath induces cough TDC dressing intact Sitting in the chair - overall disgruntled Abdomen obese 2+ pitting edema bilateral LE's still very doughy but somewhat less R AVF with hematoma at incision site, + bruit.  Reduced sensation pinky side of hand, slightly cooler than the rest of his hand (to me)  Lab Results:  Recent Labs  05/31/15 0456  WBC 5.6  HGB 7.5*  HCT 24.0*  PLT 163   Lab Results  Component Value Date   INR 2.18* 06/02/2015   INR 2.49* 06/01/2015   INR 3.32* 05/31/2015     Recent Labs  05/31/15 0456 06/02/15 0427  NA 131* 132*  K 4.8 4.0  CL 91* 92*  CO2 28 27  GLUCOSE 107* 93  BUN 36* 38*  CREATININE 6.13* 7.06*  CALCIUM 8.6* 8.9    Scheduled Medications: . atorvastatin  20 mg Oral q1800  . benzonatate  200 mg Oral TID  . budesonide-formoterol  2 puff Inhalation BID  . darbepoetin (ARANESP) injection - DIALYSIS  200 mcg Intravenous Q Fri-HD  . docusate sodium  100 mg Oral BID  . doxercalciferol  1 mcg Intravenous Q M,W,F-HD  .  [START ON 06/03/2015] ferric gluconate (FERRLECIT/NULECIT) IV  125 mg Intravenous Q M,W,F-HD  . insulin aspart  0-15 Units Subcutaneous TID WC  . insulin aspart  0-5 Units Subcutaneous QHS  . ipratropium-albuterol  3 mL Nebulization TID  . magic mouthwash  5 mL Oral QID  . metoprolol  25 mg Oral BID  . midodrine  10 mg Oral TID WC  . piperacillin-tazobactam (ZOSYN)  IV  2.25 g Intravenous 3 times per day  . polyethylene glycol  17 g Oral Daily  . sodium chloride  500 mL Intravenous Once  . sodium chloride  3 mL Intravenous Q12H  . tiotropium  18 mcg Inhalation Daily  . warfarin  7.5 mg Oral ONCE-1800  . Warfarin - Pharmacist Dosing Inpatient   Does not apply q1800    Background 80 with over 66yr HTN, DJD, severe COPD, CAD, AF, prior ureteral stents, bilateral renal cysts, and known advancing late stage 4 CKD. Baseline Cr 3.9. Adm with cough, SOB, edema, worsening renal failure requiring initiation of dialysis --->new ESRD.  Assessment/Plan:  1. New start ESRD, dialysis dependent. 1st HD  11/26. S/p AVF RUA Dr. Scot Dock 11/29. [facility] 11/25.   CLIPPED for [facility] MWF 2nd shift. Probs with low BP and fluid removal.  Did get 3L off  with Friday HD.  Has to stay on BB per cards for permanent AF per cards. Will just have to continue with midodrine. HD Monday 2. Anemia on Darbe 200 QThurs Last received 12/2. Since MWF changed to QFriday. TSat 7% on 12/6. Hb down to 7.5. Rec'd feraheme earlier in the admission but tsat remains low. Started Ferrlecit with HD on 12/9 - total dose planned will be 1 gram 3. HPTH on vit D - PTH 159 on 11/23. Hectorol 1 mcg TIW on MWF with HD.  4. COPD longstanding - bronchodilators 5. Diastolic heart failure. LVEF 55-60.  6. AFib -- coumadin. Pharmacy dosing. Metoprolol for rate control - Cards says no amio since AF is permanent and not paroxysmal so will have to continue the BB. He will need to have his coumadin followed either by PCP or cardiology at discharge - dialysis  unit is not set up to manage  7. DJD 8. CHF/Vol overload - having problems getting volume off.  Doing the best we can given BP limitations. Highest weight this admission ~ 99.8 kg.  We are down in the 86.5-87.5  range now but we are still nowhere near a dry weight. Got 3 liters off on Friday with HD - better than usual. 9. DM2 10. Cough with CXR 12/4 compressive atelectasis right lung/leukocytosis. Being treated as HCAP. Still on vanco and zosyn. His sputum is still discolored. Still coughing. Still O2 dependent. Repeat CXR 12/10 c/w RLL PNA. 11. MRSA PCR + 12. Obstipation/dry hard stool - change laxatives to daily 13. Urgency - bladder scan/cath urine for ua and culture 14. Right hand numbness - pinky and ring finger. Access arm. Feels a little cooler than the rest of his hand today. Numb X 3 days he says. Called Dr. Trula Slade - he will evaluate. 7. Dispo - He is refusing SNF. Very deconditioned.  Jamal Maes, MD [facility] [phone] Pager 06/02/2015, 10:51 AM

## 2015-06-02 NOTE — Progress Notes (Signed)
Subjective  -   The patient is status post right brachiocephalic fistula creation by Dr. Scot Dock on 05/21/2015.  For the past 3 days, he has been complaining of numbness in his fourth and fifth right hand digits.  As well as an overall coolness in his right hand.   Physical Exam:  Mild hematoma at the antecubital area on the right where his incision is.  There is excellent thrill within the fistula.  I can palpate a radial and ulnar signal.  I used a hand-held Doppler.  The patient has an audible Doppler signal in the palmar arch as well as in all 5 digital arteries.  They were multiphasic in the thumb index and middle finger.  They were slightly dampened in the fourth and fifth fingers.  These did augment slightly with compression of the fistula.       Assessment/Plan:    Given the physical exam findings of palpable radial and ulnar pulses with a Doppler signal present in the palmar arch and digital arteries, I suspect that this is not likely a steal syndrome but rather an ulnar nerve issue.  The patient states that he has been resting his elbows on his bedside tray.  This most likely has caused a ulnar neuropathy in the right arm.  We will continue to follow this.  I suspect that this will resolve.  I don't think this is a steal syndrome.  Dr. Scot Dock will follow-up tomorrow  Annamarie Major 06/02/2015 2:37 PM --  Danley Danker Vitals:   06/01/15 2155 06/02/15 0432  BP: 107/30 137/50  Pulse: 97 76  Temp: 97.7 F (36.5 C) 98.2 F (36.8 C)  Resp: 20 18    Intake/Output Summary (Last 24 hours) at 06/02/15 1437 Last data filed at 06/02/15 0959  Gross per 24 hour  Intake    240 ml  Output      0 ml  Net    240 ml     Laboratory CBC    Component Value Date/Time   WBC 5.6 05/31/2015 0456   HGB 7.5* 05/31/2015 0456   HCT 24.0* 05/31/2015 0456   PLT 163 05/31/2015 0456    BMET    Component Value Date/Time   NA 132* 06/02/2015 0427   K 4.0 06/02/2015 0427   CL 92*  06/02/2015 0427   CO2 27 06/02/2015 0427   GLUCOSE 93 06/02/2015 0427   BUN 38* 06/02/2015 0427   CREATININE 7.06* 06/02/2015 0427   CREATININE 2.62* 05/25/2014 1711   CALCIUM 8.9 06/02/2015 0427   GFRNONAA 7* 06/02/2015 0427   GFRAA 8* 06/02/2015 0427    COAG Lab Results  Component Value Date   INR 2.18* 06/02/2015   INR 2.49* 06/01/2015   INR 3.32* 05/31/2015   No results found for: PTT  Antibiotics Anti-infectives    Start     Dose/Rate Route Frequency Ordered Stop   05/31/15 1400  vancomycin (VANCOCIN) IVPB 1000 mg/200 mL premix     1,000 mg 200 mL/hr over 60 Minutes Intravenous  Once 05/31/15 1305 05/31/15 1421   05/31/15 1200  vancomycin (VANCOCIN) IVPB 1000 mg/200 mL premix  Status:  Discontinued     1,000 mg 200 mL/hr over 60 Minutes Intravenous Every M-W-F (Hemodialysis) 05/27/15 1351 05/30/15 1056   05/31/15 1000  vancomycin (VANCOCIN) IVPB 1000 mg/200 mL premix  Status:  Discontinued     1,000 mg 200 mL/hr over 60 Minutes Intravenous  Once 05/31/15 0949 05/31/15 1305   05/27/15 1200  vancomycin (  VANCOCIN) IVPB 1000 mg/200 mL premix  Status:  Discontinued     1,000 mg 200 mL/hr over 60 Minutes Intravenous Every M-W-F (Hemodialysis) 05/26/15 1433 05/27/15 1351   05/26/15 1500  piperacillin-tazobactam (ZOSYN) IVPB 2.25 g     2.25 g 100 mL/hr over 30 Minutes Intravenous 3 times per day 05/26/15 1433     05/26/15 1445  vancomycin (VANCOCIN) 2,000 mg in sodium chloride 0.9 % 500 mL IVPB     2,000 mg 250 mL/hr over 120 Minutes Intravenous NOW 05/26/15 1433 05/26/15 1904   05/20/15 0600  ceFAZolin (ANCEF) IVPB 1 g/50 mL premix    Comments:  Send with pt to OR   1 g 100 mL/hr over 30 Minutes Intravenous To ShortStay Surgical 05/20/15 1011 05/21/15 0600   05/17/15 1530  [MAR Hold]  ceFAZolin (ANCEF) IVPB 2 g/50 mL premix     (MAR Hold since 05/21/15 0912)   2 g 100 mL/hr over 30 Minutes Intravenous  Once 05/17/15 1518 05/21/15 1059   05/17/15 1448  ceFAZolin (ANCEF)  2-3 GM-% IVPB SOLR    Comments:  Desiree Hane   : cabinet override      05/17/15 1448 05/17/15 1519   05/14/15 1000  doxycycline (VIBRA-TABS) tablet 100 mg     100 mg Oral Every 12 hours 05/14/15 0116 05/19/15 2218   05/13/15 2145  azithromycin (ZITHROMAX) 500 mg in dextrose 5 % 250 mL IVPB     500 mg 250 mL/hr over 60 Minutes Intravenous  Once 05/13/15 2137 05/13/15 2258       V. Leia Alf, M.D. [facility] Office: [phone] Pager:  [phone]

## 2015-06-02 NOTE — Progress Notes (Signed)
ANTICOAGULATION CONSULT NOTE - Follow Up Consult  Pharmacy Consult for warfarin Indication: atrial fibrillation  Allergies  Allergen Reactions  . Yellow Dyes (Non-Tartrazine) Other (See Comments)    Burns arms Cough medications (tussionex and benzonatate ok)  . Levaquin [Levofloxacin In D5w] Itching    Patient Measurements: Height: 5\' 8"  (172.7 cm) Weight: 192 lb 7.4 oz (87.3 kg) IBW/kg (Calculated) : 68.4  Vital Signs: Temp: 98.2 F (36.8 C) (12/11 0432) Temp Source: Oral (12/11 0432) BP: 137/50 mmHg (12/11 0432) Pulse Rate: 76 (12/11 0432)  Labs:  Recent Labs  05/31/15 0456 06/01/15 0517 06/02/15 0427  HGB 7.5*  --   --   HCT 24.0*  --   --   PLT 163  --   --   LABPROT 33.0* 26.6* 24.1*  INR 3.32* 2.49* 2.18*  CREATININE 6.13*  --  7.06*    Estimated Creatinine Clearance: 9.3 mL/min (by C-G formula based on Cr of 7.06).  Assessment:  80 y/o M with CKD V on apixaban PTA now transitioned to warfarin due to ESRD. INR 3.46>3.32>2.49>2.18. Hgb 7.5, plts 163  Goal of Therapy:  INR 2-3 Monitor platelets by anticoagulation protocol: Yes   Plan:  Warfarin 7.5 mg po x 1 tonight Daily INR, CBC Monitor for S&S of bleed  Angela Burke, PharmD Pharmacy Resident Pager: [phone] 06/02/2015,9:06 AM

## 2015-06-02 NOTE — Progress Notes (Signed)
Pt. Has multiple complaints, Right hand, ring finger and pinky numb per patient. No changes in fingers since yesterday, MD aware of fingers and has been instructed to follow up with PCP for further workup. Also, complains of penis swelling, and wants the renal doctors to assess, Will continue to monitor. Wife at bedside.

## 2015-06-02 NOTE — Progress Notes (Signed)
Pt. Straight cathed earlier this afternoon by Charge nurse. 39ml received. Will continue to monitor.

## 2015-06-02 NOTE — Progress Notes (Signed)
[facility]                                                                              Patient Demographics  Terry [name], is a 80 y.o. male, DOB - 06/05/1935, [id number]  Admit date - 05/13/2015   Admitting Physician Edwin Dada, MD  Outpatient Primary MD for the patient is [name], DO  LOS - 75   Chief Complaint  Patient presents with  . Cough  . Shortness of Breath       Brief HPI   80 year old male with a history of COPD, hypertension, CAD/CABG, CKD stage IV, atrial fibrillation diagnosed October 2015 on apixaban presented with one-week history of worsening cough and shortness of breath. The patient was seen by his primary care provider 4 days prior to admission and prescribed Augmentin and prednisone without improvement. He presented to the emergency department where he was noted to have oxygen saturation of 80% on room air and found to be in respiratory distress. The patient was started on doxycycline and Solu-Medrol. The patient has also been describing symptoms of orthopnea and PND with increasing abdominal girth and lower extremity edema for the past week prior to admission.  After admission, the patient was started on intravenous steroids. His breathing did not improve. He subsequently started on intravenous furosemide for volume excess. Ultimately, the patient was declared ESRD and dialysis catheter was placed and dialysis initiated on 05/17/2015.   Assessment & Plan    Principal Problem: Acute respiratory failure with hypoxia-  - Secondary to CHF and COPD exacerbation, volume overload, O2 sats 96% on 4 L  - continue Pulmonary hygiene and treatment for COPD/HD for volume control  - Currently on IV Vanco and Zosyn, CXR concerning for right lower lobe pneumonia, hyperinflated lungs  Active problems Acute diastolic CHF  - remains clinically volume overloaded, on back-to-back HD. Furosemide discontinued as patient is started on HD.   - 2-D echo on 05/16/15 showed EF 55-60%, trivial MR/TR - Weight 208 pounds on 11/22 -> 192 - Volume now managed with HD, low-sodium diet however BP also limiting factor   Acute on chronic renal failure (CKD 4-5), Now new on ESRD started on HD -Previous Baseline creatinine 3.2-3.4. Nephrology and vascular surgery was consulted. Patient underwent right AV fistula placement on 05/21/15. PermCath was placed by radiology on 05/17/15 -Patient started on hemodialysis, needs Klonopin 1 mg prior to HD for anxiety and Robaxin for cramping.  - CLIPPED with [facility] HD outpatient unit but not ready yet - PT recommending skilled nursing facility, very deconditioned, wife works during the day, discussed with social work, however patient refusing skilled nursing facility - Renal ultrasound showed multiple small renal cysts, no hydronephrosis or renal obstruction or any hemorrhage.  COPD exacerbation  -Patient has >60 pk years, completed steroid tapering, continue bronchodilators  Atrial fibrillation  - CHADS-VASc = 6 - Discontinued apixaban due to worsening renal function/new ESRD - Continue Coumadin and will arrange follow-up appointment with cardiology for INR at discharge  - Patient is on metoprolol tartrate for rate control however this is also causing hypotension and limiting factor for  hemodialysis. Per cardiology no other options at this time, continue metoprolol and midodrine.   hypotension with history of Hypertension -Continue metoprolol, midodrine -HD helping with volume and BP control  Diabetes mellitus type 2 -Checked hemoglobin A1c--6.6, pt agreeable to start po meds upon d/c (low dose glipizide or glyburide) -NovoLog sliding scale while inpatient   Coronary artery disease -Cardiac cath 11/30/1997. Mild distal Left main stenosis, 25% proximal LAD, minor irregularities Circumflex, 95% mid RCA stenosis treated with 3.5 x 32 mm bare metal stent -no angina  presently   Hyponatremia -Secondary to worsening renal function and fluid overload, improving with HD  Numbness right fourth and fifth finger - Recommend outpatient workup for carpal tunnel syndrome or right shoulder MRI unless numbness and tingling worsens or any focal weakness. Patient agreeable with the plan.   Constipation: - Continue stool softeners and laxatives    Code Status: Full code  Family Communication: Discussed in detail with the patient, all imaging results, lab results explained to the patient and wife.   Disposition Plan: - PT recommending skilled nursing facility, very deconditioned, wife works during the day, discussed with social work however patient declined skilled nursing facility  Time Spent in minutes  86minutes  Procedures  HD  Consults   Renal Cardiology  DVT Prophylaxis  Lovenox  Medications  Scheduled Meds: . atorvastatin  20 mg Oral q1800  . benzonatate  200 mg Oral TID  . budesonide-formoterol  2 puff Inhalation BID  . darbepoetin (ARANESP) injection - DIALYSIS  200 mcg Intravenous Q Fri-HD  . docusate sodium  100 mg Oral BID  . doxercalciferol  1 mcg Intravenous Q M,W,F-HD  . [START ON 06/03/2015] ferric gluconate (FERRLECIT/NULECIT) IV  125 mg Intravenous Q M,W,F-HD  . insulin aspart  0-15 Units Subcutaneous TID WC  . insulin aspart  0-5 Units Subcutaneous QHS  . ipratropium-albuterol  3 mL Nebulization TID  . magic mouthwash  5 mL Oral QID  . metoprolol  25 mg Oral BID  . midodrine  10 mg Oral TID WC  . piperacillin-tazobactam (ZOSYN)  IV  2.25 g Intravenous 3 times per day  . polyethylene glycol  17 g Oral Daily  . sodium chloride  500 mL Intravenous Once  . sodium chloride  3 mL Intravenous Q12H  . tiotropium  18 mcg Inhalation Daily  . warfarin  7.5 mg Oral ONCE-1800  . Warfarin - Pharmacist Dosing Inpatient   Does not apply q1800   Continuous Infusions: . sodium chloride 10 mL/hr at 05/21/15 0946   PRN Meds:.albuterol,  clonazePAM, guaiFENesin-dextromethorphan, HYDROcodone-acetaminophen, lactulose, methocarbamol, morphine injection, ondansetron **OR** ondansetron (ZOFRAN) IV, oxyCODONE   Antibiotics   Anti-infectives    Start     Dose/Rate Route Frequency Ordered Stop   05/31/15 1400  vancomycin (VANCOCIN) IVPB 1000 mg/200 mL premix     1,000 mg 200 mL/hr over 60 Minutes Intravenous  Once 05/31/15 1305 05/31/15 1421   05/31/15 1200  vancomycin (VANCOCIN) IVPB 1000 mg/200 mL premix  Status:  Discontinued     1,000 mg 200 mL/hr over 60 Minutes Intravenous Every M-W-F (Hemodialysis) 05/27/15 1351 05/30/15 1056   05/31/15 1000  vancomycin (VANCOCIN) IVPB 1000 mg/200 mL premix  Status:  Discontinued     1,000 mg 200 mL/hr over 60 Minutes Intravenous  Once 05/31/15 0949 05/31/15 1305   05/27/15 1200  vancomycin (VANCOCIN) IVPB 1000 mg/200 mL premix  Status:  Discontinued     1,000 mg 200 mL/hr over 60 Minutes Intravenous Every M-W-F (Hemodialysis)  05/26/15 1433 05/27/15 1351   05/26/15 1500  piperacillin-tazobactam (ZOSYN) IVPB 2.25 g     2.25 g 100 mL/hr over 30 Minutes Intravenous 3 times per day 05/26/15 1433     05/26/15 1445  vancomycin (VANCOCIN) 2,000 mg in sodium chloride 0.9 % 500 mL IVPB     2,000 mg 250 mL/hr over 120 Minutes Intravenous NOW 05/26/15 1433 05/26/15 1904   05/20/15 0600  ceFAZolin (ANCEF) IVPB 1 g/50 mL premix    Comments:  Send with pt to OR   1 g 100 mL/hr over 30 Minutes Intravenous To [facility] 05/20/15 1011 05/21/15 0600   05/17/15 1530  [MAR Hold]  ceFAZolin (ANCEF) IVPB 2 g/50 mL premix     (MAR Hold since 05/21/15 0912)   2 g 100 mL/hr over 30 Minutes Intravenous  Once 05/17/15 1518 05/21/15 1059   05/17/15 1448  ceFAZolin (ANCEF) 2-3 GM-% IVPB SOLR    Comments:  Desiree Hane   : cabinet override      05/17/15 1448 05/17/15 1519   05/14/15 1000  doxycycline (VIBRA-TABS) tablet 100 mg     100 mg Oral Every 12 hours 05/14/15 0116 05/19/15 2218   05/13/15  2145  azithromycin (ZITHROMAX) 500 mg in dextrose 5 % 250 mL IVPB     500 mg 250 mL/hr over 60 Minutes Intravenous  Once 05/13/15 2137 05/13/15 2258        Subjective:   Devern Pitt was seen and examined today. Frustrated and irritable this morning, complaining about constipation. Had BM yesterday.    Objective:   Blood pressure 137/50, pulse 76, temperature 98.2 F (36.8 C), temperature source Oral, resp. rate 18, height 5\' 8"  (1.727 m), weight 87.3 kg (192 lb 7.4 oz), SpO2 96 %.  Wt Readings from Last 3 Encounters:  06/01/15 87.3 kg (192 lb 7.4 oz)  05/01/15 93.895 kg (207 lb)  03/20/15 94.348 kg (208 lb)    No intake or output data in the 24 hours ending 06/02/15 [id number]  Exam  General: Alert and oriented x 3, NAD, upset   HEENT:  PERRLA, EOM  Neck: Supple, no JVD  CVS: S1 S2 clear, RRR  Respiratory: CTAB  Abdomen: Soft, NT, ND, NBS  Ext: no cyanosis clubbing, 2+ pitting edema  Neuro: no new deficits  Skin: No rashes  Psych: Normal affect and demeanor, alert and oriented x3    Data Review   Micro Results No results found for this or any previous visit (from the past 240 hour(s)).  Radiology Reports Dg Chest 2 View  06/01/2015  CLINICAL DATA:  Cough, productive cough EXAM: CHEST  2 VIEW COMPARISON:  None. FINDINGS: Normal cardiac silhouette. Large-bore RIGHT shift venous line noted. No pneumothorax. Increased RIGHT basilar linear opacity. Lateral projection there is increased density over the inferior spine. Lungs are hyperinflated. IMPRESSION: Concern for RIGHT lower lobe pneumonia. Hyperinflated lungs. These results will be called to the ordering clinician or representative by the Radiologist Assistant, and communication documented in the PACS or zVision Dashboard. Electronically Signed   By: Suzy Bouchard M.D.   On: 06/01/2015 14:30   Dg Chest 2 View  05/26/2015  CLINICAL DATA:  History of pneumonia.  Shortness of breath. EXAM: CHEST  2 VIEW  COMPARISON:  05/24/2015 FINDINGS: Large bore right internal jugular approach central venous catheter is stable. Cardiomediastinal silhouette is normal. Mediastinal contours appear intact. There is no evidence of pneumothorax. There is a right pleural effusion with linear airspace opacity in the right  lower lobe likely representing atelectasis. The left costophrenic angle is incompletely visualized, and therefore small left pleural effusion cannot be excluded. Osseous structures are without acute abnormality. Soft tissues are grossly normal. IMPRESSION: Right pleural effusion with probably compressive atelectasis in the right lower lobe. Left costophrenic angle incompletely visualized, and therefore small left pleural effusion cannot be excluded. Electronically Signed   By: Fidela Salisbury M.D.   On: 05/26/2015 11:09   Dg Chest 2 View  05/13/2015  CLINICAL DATA:  80 year old with 1 week history of productive cough and severe shortness of breath. Former smoker with current history of COPD. EXAM: CHEST  2 VIEW COMPARISON:  05/28/2014 and earlier. FINDINGS: AP erect and lateral imaging was performed. Cardiac silhouette mildly enlarged with a right coronary artery stent. Thoracic aorta atherosclerotic, unchanged. Prominent central pulmonary arteries, unchanged. Scarring in the lower lobes, right greater than left, unchanged. Hyperinflation, emphysematous changes in the upper lobes, and mild central peribronchial thickening, unchanged. No new pulmonary parenchymal abnormalities. Degenerative disc disease and spondylosis involving the thoracic spine. IMPRESSION: COPD/emphysema. Scarring in the lower lobes, right greater than left. No acute cardiopulmonary disease. Stable examination. Electronically Signed   By: Evangeline Dakin M.D.   On: 05/13/2015 21:03   US Renal  05/31/2015  CLINICAL DATA:  Left flank pain. EXAM: RENAL / URINARY TRACT ULTRASOUND COMPLETE COMPARISON:  Ultrasound of May 15, 2015.  FINDINGS: Right Kidney: Length: 10 cm. Multiple small cysts are again noted. Echogenicity within normal limits. No mass or hydronephrosis visualized. Left Kidney: Length: 12.5 cm. Multiple small cysts are again noted. Echogenicity within normal limits. No mass or hydronephrosis visualized. Bladder: Not visualized as patient was in sitting position, and refused to lay back in supine position. IMPRESSION: Multiple small renal cysts are noted bilaterally. No hydronephrosis or renal obstruction is noted. Electronically Signed   By: Marijo Conception, M.D.   On: 05/31/2015 21:02   US Renal  05/15/2015  CLINICAL DATA:  Acute renal injury EXAM: RENAL / URINARY TRACT ULTRASOUND COMPLETE COMPARISON:  10/24/2014 FINDINGS: Right Kidney: Length: 14.8 cm. Multiple cysts are noted. The largest of these measures 2.2 cm in greatest dimension. Left Kidney: Length: 14.0 cm. Multiple cysts are noted. The largest of these measures 3.1 cm in greatest dimension. Bladder: Appears normal for degree of bladder distention. IMPRESSION: Bilateral renal cysts similar to that seen on prior MRI examination. No acute obstructive changes are noted. Electronically Signed   By: Inez Catalina M.D.   On: 05/15/2015 16:29   Ir Fluoro Guide Cv Line Right  05/17/2015  INDICATION: Chronic kidney disease with early uremia. Need to start hemodialysis. EXAM: FLUOROSCOPIC AND ULTRASOUND GUIDED PLACEMENT OF A TUNNELED DIALYSIS CATHETER Physician: Stephan Minister. Henn, MD FLUOROSCOPY TIME:  42 seconds, 13.2 mGy MEDICATIONS: 2 g Ancef. As antibiotic prophylaxis, Ancef was ordered pre-procedure and administered intravenously within one hour of incision. 0.5 mg versed, 25 mcg fentanyl. A radiology nurse monitored the patient for moderate sedation. ANESTHESIA/SEDATION: Moderate sedation time: 30 minutes PROCEDURE: Informed consent was obtained for placement of a tunneled dialysis catheter. The patient was placed supine on the interventional table. Ultrasound confirmed  a patent right internal jugularvein. Ultrasound images were obtained for documentation. The right side of the neck was prepped and draped in a sterile fashion. The right side of the neck was anesthetized with 1% lidocaine. Maximal barrier sterile technique was utilized including caps, mask, sterile gowns, sterile gloves, sterile drape, hand hygiene and skin antiseptic. A small incision was made with #11  blade scalpel. A 21 gauge needle directed into the right internal jugular vein with ultrasound guidance. A micropuncture dilator set was placed. A 23 cm tip to cuff Palindrome catheter was selected. The skin below the right clavicle was anesthetized and a small incision was made with an #11 blade scalpel. A subcutaneous tunnel was formed to the vein dermatotomy site. The catheter was brought through the tunnel. The vein dermatotomy site was dilated to accommodate a peel-away sheath. The catheter was placed through the peel-away sheath and directed into the central venous structures. The tip of the catheter was placed at the superior cavoatrial junction with fluoroscopy. Fluoroscopic images were obtained for documentation. Both lumens were found to aspirate and flush well. The proper amount of heparin was flushed in both lumens. The vein dermatotomy site was closed using a single layer of absorbable suture and Dermabond. The catheter was secured to the skin using Prolene suture. FINDINGS: Catheter tip at the superior cavoatrial junction. Estimated blood loss: Minimal COMPLICATIONS: None IMPRESSION: Successful placement of a right jugular tunneled dialysis catheter using ultrasound and fluoroscopic guidance. Electronically Signed   By: Markus Daft M.D.   On: 05/17/2015 16:23   Ir US Guide Vasc Access Right  05/17/2015  INDICATION: Chronic kidney disease with early uremia. Need to start hemodialysis. EXAM: FLUOROSCOPIC AND ULTRASOUND GUIDED PLACEMENT OF A TUNNELED DIALYSIS CATHETER Physician: Stephan Minister. Henn, MD  FLUOROSCOPY TIME:  42 seconds, 13.2 mGy MEDICATIONS: 2 g Ancef. As antibiotic prophylaxis, Ancef was ordered pre-procedure and administered intravenously within one hour of incision. 0.5 mg versed, 25 mcg fentanyl. A radiology nurse monitored the patient for moderate sedation. ANESTHESIA/SEDATION: Moderate sedation time: 30 minutes PROCEDURE: Informed consent was obtained for placement of a tunneled dialysis catheter. The patient was placed supine on the interventional table. Ultrasound confirmed a patent right internal jugularvein. Ultrasound images were obtained for documentation. The right side of the neck was prepped and draped in a sterile fashion. The right side of the neck was anesthetized with 1% lidocaine. Maximal barrier sterile technique was utilized including caps, mask, sterile gowns, sterile gloves, sterile drape, hand hygiene and skin antiseptic. A small incision was made with #11 blade scalpel. A 21 gauge needle directed into the right internal jugular vein with ultrasound guidance. A micropuncture dilator set was placed. A 23 cm tip to cuff Palindrome catheter was selected. The skin below the right clavicle was anesthetized and a small incision was made with an #11 blade scalpel. A subcutaneous tunnel was formed to the vein dermatotomy site. The catheter was brought through the tunnel. The vein dermatotomy site was dilated to accommodate a peel-away sheath. The catheter was placed through the peel-away sheath and directed into the central venous structures. The tip of the catheter was placed at the superior cavoatrial junction with fluoroscopy. Fluoroscopic images were obtained for documentation. Both lumens were found to aspirate and flush well. The proper amount of heparin was flushed in both lumens. The vein dermatotomy site was closed using a single layer of absorbable suture and Dermabond. The catheter was secured to the skin using Prolene suture. FINDINGS: Catheter tip at the superior  cavoatrial junction. Estimated blood loss: Minimal COMPLICATIONS: None IMPRESSION: Successful placement of a right jugular tunneled dialysis catheter using ultrasound and fluoroscopic guidance. Electronically Signed   By: Markus Daft M.D.   On: 05/17/2015 16:23   Dg Chest Port 1 View  05/24/2015  CLINICAL DATA:  Shortness of breath. Finished hemodialysis 1 hour previously. EXAM: PORTABLE CHEST 1  VIEW COMPARISON:  05/13/2015. FINDINGS: Normal sized heart. Clear lungs. Interval right jugular catheter with its tip in the inferior aspect of the superior vena cava. No pneumothorax. Mild right basilar atelectasis and patchy opacity with mild progression. Clear left lung. Mild lower thoracic spine degenerative changes. IMPRESSION: Mild right basilar atelectasis and possible pneumonia. Electronically Signed   By: Claudie Revering M.D.   On: 05/24/2015 20:50    CBC  Recent Labs Lab 05/27/15 1000 05/28/15 0513 05/31/15 0456  WBC 15.3* 15.0* 5.6  HGB 8.3* 8.7* 7.5*  HCT 25.9* 27.1* 24.0*  PLT 141* 159 163  MCV 94.9 93.8 94.5  MCH 30.4 30.1 29.5  MCHC 32.0 32.1 31.3  RDW 16.3* 16.0* 16.2*    Chemistries   Recent Labs Lab 05/28/15 1030 05/29/15 0708 05/30/15 0720 05/31/15 0456 06/02/15 0427  NA 133* 134* 132* 131* 132*  K 3.8 4.6 4.6 4.8 4.0  CL 95* 93* 92* 91* 92*  CO2 26 28 29 28 27   GLUCOSE 216* 122* 90 107* 93  BUN 36* 26* 18 36* 38*  CREATININE 5.22* 4.43* 3.94* 6.13* 7.06*  CALCIUM 8.2* 8.6* 8.7* 8.6* 8.9   ------------------------------------------------------------------------------------------------------------------ estimated creatinine clearance is 9.3 mL/min (by C-G formula based on Cr of 7.06). ------------------------------------------------------------------------------------------------------------------ No results for input(s): HGBA1C in the last 72 hours. ------------------------------------------------------------------------------------------------------------------ No  results for input(s): CHOL, HDL, LDLCALC, TRIG, CHOLHDL, LDLDIRECT in the last 72 hours. ------------------------------------------------------------------------------------------------------------------ No results for input(s): TSH, T4TOTAL, T3FREE, THYROIDAB in the last 72 hours.  Invalid input(s): FREET3 ------------------------------------------------------------------------------------------------------------------ No results for input(s): VITAMINB12, FOLATE, FERRITIN, TIBC, IRON, RETICCTPCT in the last 72 hours.  Coagulation profile  Recent Labs Lab 05/29/15 0708 05/30/15 0720 05/31/15 0456 06/01/15 0517 06/02/15 0427  INR 3.10* 3.46* 3.32* 2.49* 2.18*    No results for input(s): DDIMER in the last 72 hours.  Cardiac Enzymes No results for input(s): CKMB, TROPONINI, MYOGLOBIN in the last 168 hours.  Invalid input(s): CK ------------------------------------------------------------------------------------------------------------------ Invalid input(s): [place]  06/01/15 0627 06/01/15 0845 06/01/15 1145 06/01/15 1729 06/01/15 2157 06/02/15 0816  GLUCAP 90 87 112* 103* 121* 67     Lativia Velie M.D. [facility] 06/02/2015, 9:22 AM  Pager: [phone] Between 7am to 7pm - call Pager - [phone]  After 7pm go to [email or web address] - password [email or web address]  Call night coverage person covering after 7pm

## 2015-06-03 LAB — GLUCOSE, CAPILLARY
GLUCOSE-CAPILLARY: 112 mg/dL — AB (ref 65–99)
GLUCOSE-CAPILLARY: 95 mg/dL (ref 65–99)
GLUCOSE-CAPILLARY: 170 mg/dL — AB (ref 65–99)
Glucose-Capillary: 92 mg/dL (ref 65–99)

## 2015-06-03 LAB — CBC
HEMATOCRIT: 24.7 % — AB (ref 39.0–52.0)
HEMOGLOBIN: 8.1 g/dL — AB (ref 13.0–17.0)
MCH: 29.8 pg (ref 26.0–34.0)
MCHC: 32.8 g/dL (ref 30.0–36.0)
MCV: 90.8 fL (ref 78.0–100.0)
Platelets: 238 10*3/uL (ref 150–400)
RBC: 2.72 MIL/uL — ABNORMAL LOW (ref 4.22–5.81)
RDW: 16.2 % — ABNORMAL HIGH (ref 11.5–15.5)
WBC: 6.5 10*3/uL (ref 4.0–10.5)

## 2015-06-03 LAB — RENAL FUNCTION PANEL
ALBUMIN: 3.2 g/dL — AB (ref 3.5–5.0)
ANION GAP: 15 (ref 5–15)
BUN: 55 mg/dL — ABNORMAL HIGH (ref 6–20)
CALCIUM: 8.6 mg/dL — AB (ref 8.9–10.3)
CO2: 24 mmol/L (ref 22–32)
CREATININE: 8.99 mg/dL — AB (ref 0.61–1.24)
Chloride: 86 mmol/L — ABNORMAL LOW (ref 101–111)
GFR, EST AFRICAN AMERICAN: 6 mL/min — AB (ref >60)
GFR, EST NON AFRICAN AMERICAN: 5 mL/min — AB (ref >60)
Glucose, Bld: 141 mg/dL — ABNORMAL HIGH (ref 65–99)
PHOSPHORUS: 5 mg/dL — AB (ref 2.5–4.6)
Potassium: 4.5 mmol/L (ref 3.5–5.1)
SODIUM: 125 mmol/L — AB (ref 135–145)

## 2015-06-03 LAB — URINE CULTURE: Culture: NO GROWTH

## 2015-06-03 LAB — PROTIME-INR
INR: 2.12 — AB (ref 0.00–1.49)
Prothrombin Time: 23.5 seconds — ABNORMAL HIGH (ref 11.6–15.2)

## 2015-06-03 MED ORDER — DOXERCALCIFEROL 4 MCG/2ML IV SOLN
INTRAVENOUS | Status: AC
Start: 2015-06-03 — End: 2015-06-03
  Filled 2015-06-03: qty 2

## 2015-06-03 MED ORDER — DARBEPOETIN ALFA 200 MCG/0.4ML IJ SOSY
200.0000 ug | PREFILLED_SYRINGE | INTRAMUSCULAR | Status: DC
  Administered 2015-06-03: 200 ug via INTRAVENOUS

## 2015-06-03 MED ORDER — MIDODRINE HCL 5 MG PO TABS
ORAL_TABLET | ORAL | Status: AC
  Administered 2015-06-03: 5 mg
  Filled 2015-06-03: qty 10

## 2015-06-03 MED ORDER — DARBEPOETIN ALFA 200 MCG/0.4ML IJ SOSY
PREFILLED_SYRINGE | INTRAMUSCULAR | Status: AC
  Administered 2015-06-03: 200 ug via INTRAVENOUS
  Filled 2015-06-03: qty 0.4

## 2015-06-03 MED ORDER — VANCOMYCIN HCL IN DEXTROSE 1-5 GM/200ML-% IV SOLN
1000.0000 mg | INTRAVENOUS | Status: DC
  Administered 2015-06-03: 1000 mg via INTRAVENOUS
  Filled 2015-06-03 (×2): qty 200

## 2015-06-03 MED ORDER — WARFARIN SODIUM 4 MG PO TABS
4.0000 mg | ORAL_TABLET | Freq: Every day | ORAL | Status: DC
  Filled 2015-06-03 (×2): qty 1

## 2015-06-03 NOTE — Procedures (Signed)
Patient was seen on dialysis and the procedure was supervised. BFR 400 Via RIJ tdc BP is 102/44.  Patient appears to be tolerating treatment well.

## 2015-06-03 NOTE — Care Management Important Message (Signed)
Important Message  Patient Details  Name: Terry [name] MRN: [id number] Date of Birth: 12/23/1934   Medicare Important Message Given:  Yes    Zenon Mayo, RN 06/03/2015, 3:48 [place] Message  Patient Details  Name: Terry [name] MRN: [id number] Date of Birth: [birth year]   Medicare Important Message Given:  Yes    Zenon Mayo, RN 06/03/2015, 3:48 PM

## 2015-06-03 NOTE — Progress Notes (Signed)
ANTICOAGULATION  Antibiotic CONSULT NOTE - Follow Up Consult  Pharmacy Consult for Warfarin  / Vancomycin and Zosyn Indication: atrial fibrillation / HCAP  Allergies  Allergen Reactions  . Yellow Dyes (Non-Tartrazine) Other (See Comments)    Burns arms Cough medications (tussionex and benzonatate ok)  . Levaquin [Levofloxacin In D5w] Itching    Patient Measurements: Height: 5\' 8"  (172.7 cm) Weight: 192 lb 7.4 oz (87.3 kg) IBW/kg (Calculated) : 68.4  Vital Signs: Temp: 97.7 F (36.5 C) (12/12 1336) Temp Source: Oral (12/12 1336) BP: 156/57 mmHg (12/12 1336) Pulse Rate: 72 (12/12 1336)  Labs:  Recent Labs  06/01/15 0517 06/02/15 0427 06/03/15 0634  HGB  --   --  8.1*  HCT  --   --  24.7*  PLT  --   --  238  LABPROT 26.6* 24.1* 23.5*  INR 2.49* 2.18* 2.12*  CREATININE  --  7.06*  --     Estimated Creatinine Clearance: 9.3 mL/min (by C-G formula based on Cr of 7.06).   Assessment: 80 y/o male with CKD V who was on apixiban PTA for Afib who has now transitioned to ESRD and is HD dependent.  AC/heme:coumadin for afib (PTA apixaban - changed d/t renal dysfxn, renal MD wanted lovenox) -  INR = 2.12 therapeutic. CBC stable, no bleeding noted. Initiated Coumadin 12/1. High fall risk. CHADS-VASc = 6  Vancomycin / Zosyn D#9  For HCAP,  CXR concerning for RLL PNA   Doxy: 11/23>>11/27 Vanco 12/4>> Zosyn 12/4>> 12/8 VR = 27 11/22 MRSA PCR+ 12/11 cath urine >>ngtd   Goal of Therapy:  INR 2-3  Pre-HD vanc level 15-25 mcg/ml Appropriate antibiotic dosing   Plan: - coumadin 4 mg po daily at 1800 - Continue Zosyn 2.25 grams iv Q 8 hours - Continue Vancomycin after HD - 1 gm q MWF - if to continue will check a pre-HD vanc level Wednesday  Eudelia Bunch, Pharm.D. [id number] 06/03/2015 2:19 PM

## 2015-06-03 NOTE — Progress Notes (Signed)
[facility]                                                                              Patient Demographics  Terry [name], is a 80 y.o. male, DOB - 03-Feb-1935, [id number]  Admit date - 05/13/2015   Admitting Physician Edwin Dada, MD  Outpatient Primary MD for the patient is [name], DO  LOS - 20   Chief Complaint  Patient presents with  . Cough  . Shortness of Breath       Brief HPI   80 year old male with a history of COPD, hypertension, CAD/CABG, CKD stage IV, atrial fibrillation diagnosed October 2015 on apixaban presented with one-week history of worsening cough and shortness of breath. The patient was seen by his primary care provider 4 days prior to admission and prescribed Augmentin and prednisone without improvement. He presented to the emergency department where he was noted to have oxygen saturation of 80% on room air and found to be in respiratory distress. The patient was started on doxycycline and Solu-Medrol. The patient has also been describing symptoms of orthopnea and PND with increasing abdominal girth and lower extremity edema for the past week prior to admission.  After admission, the patient was started on intravenous steroids. His breathing did not improve. He subsequently started on intravenous furosemide for volume excess. Ultimately, the patient was declared ESRD and dialysis catheter was placed and dialysis initiated on 05/17/2015.   Assessment & Plan    Principal Problem: Acute respiratory failure with hypoxia-  - Secondary to CHF and COPD exacerbation, volume overload, O2 sats 95% on 4 L  - continue Pulmonary hygiene and treatment for COPD/HD for volume control  - Currently on IV Vanco and Zosyn, CXR concerning for right lower lobe pneumonia, hyperinflated lungs  Active problems Acute diastolic CHF  - remains clinically volume overloaded, on back-to-back HD. Furosemide discontinued as patient is started on HD.   - 2-D echo on 05/16/15 showed EF 55-60%, trivial MR/TR - Weight 208 pounds on 11/22 -> 192 - Volume now managed with HD, low-sodium diet however BP also limiting factor   Acute on chronic renal failure (CKD 4-5), Now new on ESRD started on HD -Previous Baseline creatinine 3.2-3.4. Nephrology and vascular surgery was consulted. Patient underwent right AV fistula placement on 05/21/15. PermCath was placed by radiology on 05/17/15 -Patient started on hemodialysis, needs Klonopin 1 mg prior to HD for anxiety and Robaxin for cramping.  - CLIPPED with [facility] HD outpatient unit but not ready yet - PT recommending skilled nursing facility, very deconditioned, wife works during the day, discussed with social work, however patient refusing skilled nursing facility - Renal ultrasound showed multiple small renal cysts, no hydronephrosis or renal obstruction or any hemorrhage.  COPD exacerbation  -Patient has >60 pk years, completed steroid tapering, continue bronchodilators  Atrial fibrillation  - CHADS-VASc = 6 - Discontinued apixaban due to worsening renal function/new ESRD - Continue Coumadin and will arrange follow-up appointment with cardiology for INR at discharge  - Patient is on metoprolol tartrate for rate control however this is also causing hypotension and limiting factor for  hemodialysis. Per cardiology no other options at this time, continue metoprolol and midodrine.   hypotension with history of Hypertension -Continue metoprolol, midodrine -HD helping with volume and BP control  Diabetes mellitus type 2 -Checked hemoglobin A1c--6.6, pt agreeable to start po meds upon d/c (low dose glipizide or glyburide) -NovoLog sliding scale while inpatient   Coronary artery disease -Cardiac cath 11/30/1997. Mild distal Left main stenosis, 25% proximal LAD, minor irregularities Circumflex, 95% mid RCA stenosis treated with 3.5 x 32 mm bare metal stent -no angina  presently   Hyponatremia -Stable, Secondary to worsening renal function and fluid overload, improving with HD  Numbness right fourth and fifth finger - Recommend outpatient workup for carpal tunnel syndrome, ulnar nerve neuropathy. Right shoulder MRI unless numbness and tingling worsens or any focal weakness.  - Patient was seen by vascular surgery who also agrees that this is not steal syndrome, and is likely neuropathy  Diarrhea Likely due to laxatives, enema, HOLD stool softeners and laxatives   Code Status: Full code  Family Communication: Discussed in detail with the patient, all imaging results, lab results explained to the patient and wife.   Disposition Plan: - PT recommending skilled nursing facility, very deconditioned, wife works during the day, discussed with social work however patient declined skilled nursing facility  Time Spent in minutes  17minutes  Procedures  HD  Consults   Renal Cardiology  DVT Prophylaxis  Lovenox  Medications  Scheduled Meds: . atorvastatin  20 mg Oral q1800  . benzonatate  200 mg Oral TID  . budesonide-formoterol  2 puff Inhalation BID  . darbepoetin (ARANESP) injection - DIALYSIS  200 mcg Intravenous Q Fri-HD  . doxercalciferol  1 mcg Intravenous Q M,W,F-HD  . ferric gluconate (FERRLECIT/NULECIT) IV  125 mg Intravenous Q M,W,F-HD  . insulin aspart  0-15 Units Subcutaneous TID WC  . insulin aspart  0-5 Units Subcutaneous QHS  . ipratropium-albuterol  3 mL Nebulization TID  . magic mouthwash  5 mL Oral QID  . metoprolol  25 mg Oral BID  . midodrine  10 mg Oral TID WC  . piperacillin-tazobactam (ZOSYN)  IV  2.25 g Intravenous 3 times per day  . sodium chloride  500 mL Intravenous Once  . sodium chloride  3 mL Intravenous Q12H  . tiotropium  18 mcg Inhalation Daily  . Warfarin - Pharmacist Dosing Inpatient   Does not apply q1800   Continuous Infusions: . sodium chloride 10 mL/hr at 05/21/15 0946   PRN Meds:.albuterol,  clonazePAM, guaiFENesin-dextromethorphan, HYDROcodone-acetaminophen, methocarbamol, morphine injection, ondansetron **OR** ondansetron (ZOFRAN) IV, oxyCODONE   Antibiotics   Anti-infectives    Start     Dose/Rate Route Frequency Ordered Stop   05/31/15 1400  vancomycin (VANCOCIN) IVPB 1000 mg/200 mL premix     1,000 mg 200 mL/hr over 60 Minutes Intravenous  Once 05/31/15 1305 05/31/15 1421   05/31/15 1200  vancomycin (VANCOCIN) IVPB 1000 mg/200 mL premix  Status:  Discontinued     1,000 mg 200 mL/hr over 60 Minutes Intravenous Every M-W-F (Hemodialysis) 05/27/15 1351 05/30/15 1056   05/31/15 1000  vancomycin (VANCOCIN) IVPB 1000 mg/200 mL premix  Status:  Discontinued     1,000 mg 200 mL/hr over 60 Minutes Intravenous  Once 05/31/15 0949 05/31/15 1305   05/27/15 1200  vancomycin (VANCOCIN) IVPB 1000 mg/200 mL premix  Status:  Discontinued     1,000 mg 200 mL/hr over 60 Minutes Intravenous Every M-W-F (Hemodialysis) 05/26/15 1433 05/27/15 1351   05/26/15 1500  piperacillin-tazobactam (ZOSYN) IVPB 2.25 g     2.25 g 100 mL/hr over 30 Minutes Intravenous 3 times per day 05/26/15 1433     05/26/15 1445  vancomycin (VANCOCIN) 2,000 mg in sodium chloride 0.9 % 500 mL IVPB     2,000 mg 250 mL/hr over 120 Minutes Intravenous NOW 05/26/15 1433 05/26/15 1904   05/20/15 0600  ceFAZolin (ANCEF) IVPB 1 g/50 mL premix    Comments:  Send with pt to OR   1 g 100 mL/hr over 30 Minutes Intravenous To [facility] 05/20/15 1011 05/21/15 0600   05/17/15 1530  [MAR Hold]  ceFAZolin (ANCEF) IVPB 2 g/50 mL premix     (MAR Hold since 05/21/15 0912)   2 g 100 mL/hr over 30 Minutes Intravenous  Once 05/17/15 1518 05/21/15 1059   05/17/15 1448  ceFAZolin (ANCEF) 2-3 GM-% IVPB SOLR    Comments:  Desiree Hane   : cabinet override      05/17/15 1448 05/17/15 1519   05/14/15 1000  doxycycline (VIBRA-TABS) tablet 100 mg     100 mg Oral Every 12 hours 05/14/15 0116 05/19/15 2218   05/13/15 2145   azithromycin (ZITHROMAX) 500 mg in dextrose 5 % 250 mL IVPB     500 mg 250 mL/hr over 60 Minutes Intravenous  Once 05/13/15 2137 05/13/15 2258        Subjective:   Flavel Costner was seen and examined today. Complaining of diarrhea last night and this morning. No chest pain, shortness breath, fevers or chills, abdominal pain. Asking for Klonopin prior to the HD  Objective:   Blood pressure 129/49, pulse 92, temperature 97.3 F (36.3 C), temperature source Oral, resp. rate 18, height 5\' 8"  (1.727 m), weight 87.3 kg (192 lb 7.4 oz), SpO2 95 %.  Wt Readings from Last 3 Encounters:  06/01/15 87.3 kg (192 lb 7.4 oz)  05/01/15 93.895 kg (207 lb)  03/20/15 94.348 kg (208 lb)     Intake/Output Summary (Last 24 hours) at 06/03/15 1319 Last data filed at 06/03/15 0653  Gross per 24 hour  Intake    470 ml  Output      0 ml  Net    470 ml    Exam  General: Alert and oriented x 3, NAD, upset   HEENT:  PERRLA, EOM  Neck: Supple, no JVD  CVS: S1 S2 clear, RRR  Respiratory: CTAB  Abdomen: Soft, NT, ND, NBS  Ext: no cyanosis clubbing, 2+ pitting edema  Neuro: no new deficits  Skin: No rashes  Psych: Normal affect and demeanor, alert and oriented x3    Data Review   Micro Results Recent Results (from the past 240 hour(s))  Culture, Urine     Status: None (Preliminary result)   Collection Time: 06/02/15  2:59 PM  Result Value Ref Range Status   Specimen Description URINE, CATHETERIZED  Final   Special Requests zosyn and vancomycin  Final   Culture NO GROWTH < 24 HOURS  Final   Report Status PENDING  Incomplete    Radiology Reports Dg Chest 2 View  06/01/2015  CLINICAL DATA:  Cough, productive cough EXAM: CHEST  2 VIEW COMPARISON:  None. FINDINGS: Normal cardiac silhouette. Large-bore RIGHT shift venous line noted. No pneumothorax. Increased RIGHT basilar linear opacity. Lateral projection there is increased density over the inferior spine. Lungs are hyperinflated.  IMPRESSION: Concern for RIGHT lower lobe pneumonia. Hyperinflated lungs. These results will be called to the ordering clinician or representative by the Radiologist  Assistant, and communication documented in the PACS or zVision Dashboard. Electronically Signed   By: Suzy Bouchard M.D.   On: 06/01/2015 14:30   Dg Chest 2 View  05/26/2015  CLINICAL DATA:  History of pneumonia.  Shortness of breath. EXAM: CHEST  2 VIEW COMPARISON:  05/24/2015 FINDINGS: Large bore right internal jugular approach central venous catheter is stable. Cardiomediastinal silhouette is normal. Mediastinal contours appear intact. There is no evidence of pneumothorax. There is a right pleural effusion with linear airspace opacity in the right lower lobe likely representing atelectasis. The left costophrenic angle is incompletely visualized, and therefore small left pleural effusion cannot be excluded. Osseous structures are without acute abnormality. Soft tissues are grossly normal. IMPRESSION: Right pleural effusion with probably compressive atelectasis in the right lower lobe. Left costophrenic angle incompletely visualized, and therefore small left pleural effusion cannot be excluded. Electronically Signed   By: Fidela Salisbury M.D.   On: 05/26/2015 11:09   Dg Chest 2 View  05/13/2015  CLINICAL DATA:  80 year old with 1 week history of productive cough and severe shortness of breath. Former smoker with current history of COPD. EXAM: CHEST  2 VIEW COMPARISON:  05/28/2014 and earlier. FINDINGS: AP erect and lateral imaging was performed. Cardiac silhouette mildly enlarged with a right coronary artery stent. Thoracic aorta atherosclerotic, unchanged. Prominent central pulmonary arteries, unchanged. Scarring in the lower lobes, right greater than left, unchanged. Hyperinflation, emphysematous changes in the upper lobes, and mild central peribronchial thickening, unchanged. No new pulmonary parenchymal abnormalities. Degenerative disc  disease and spondylosis involving the thoracic spine. IMPRESSION: COPD/emphysema. Scarring in the lower lobes, right greater than left. No acute cardiopulmonary disease. Stable examination. Electronically Signed   By: Evangeline Dakin M.D.   On: 05/13/2015 21:03   US Renal  05/31/2015  CLINICAL DATA:  Left flank pain. EXAM: RENAL / URINARY TRACT ULTRASOUND COMPLETE COMPARISON:  Ultrasound of May 15, 2015. FINDINGS: Right Kidney: Length: 10 cm. Multiple small cysts are again noted. Echogenicity within normal limits. No mass or hydronephrosis visualized. Left Kidney: Length: 12.5 cm. Multiple small cysts are again noted. Echogenicity within normal limits. No mass or hydronephrosis visualized. Bladder: Not visualized as patient was in sitting position, and refused to lay back in supine position. IMPRESSION: Multiple small renal cysts are noted bilaterally. No hydronephrosis or renal obstruction is noted. Electronically Signed   By: Marijo Conception, M.D.   On: 05/31/2015 21:02   US Renal  05/15/2015  CLINICAL DATA:  Acute renal injury EXAM: RENAL / URINARY TRACT ULTRASOUND COMPLETE COMPARISON:  10/24/2014 FINDINGS: Right Kidney: Length: 14.8 cm. Multiple cysts are noted. The largest of these measures 2.2 cm in greatest dimension. Left Kidney: Length: 14.0 cm. Multiple cysts are noted. The largest of these measures 3.1 cm in greatest dimension. Bladder: Appears normal for degree of bladder distention. IMPRESSION: Bilateral renal cysts similar to that seen on prior MRI examination. No acute obstructive changes are noted. Electronically Signed   By: Inez Catalina M.D.   On: 05/15/2015 16:29   Ir Fluoro Guide Cv Line Right  05/17/2015  INDICATION: Chronic kidney disease with early uremia. Need to start hemodialysis. EXAM: FLUOROSCOPIC AND ULTRASOUND GUIDED PLACEMENT OF A TUNNELED DIALYSIS CATHETER Physician: Stephan Minister. Henn, MD FLUOROSCOPY TIME:  42 seconds, 13.2 mGy MEDICATIONS: 2 g Ancef. As antibiotic  prophylaxis, Ancef was ordered pre-procedure and administered intravenously within one hour of incision. 0.5 mg versed, 25 mcg fentanyl. A radiology nurse monitored the patient for moderate sedation. ANESTHESIA/SEDATION: Moderate  sedation time: 30 minutes PROCEDURE: Informed consent was obtained for placement of a tunneled dialysis catheter. The patient was placed supine on the interventional table. Ultrasound confirmed a patent right internal jugularvein. Ultrasound images were obtained for documentation. The right side of the neck was prepped and draped in a sterile fashion. The right side of the neck was anesthetized with 1% lidocaine. Maximal barrier sterile technique was utilized including caps, mask, sterile gowns, sterile gloves, sterile drape, hand hygiene and skin antiseptic. A small incision was made with #11 blade scalpel. A 21 gauge needle directed into the right internal jugular vein with ultrasound guidance. A micropuncture dilator set was placed. A 23 cm tip to cuff Palindrome catheter was selected. The skin below the right clavicle was anesthetized and a small incision was made with an #11 blade scalpel. A subcutaneous tunnel was formed to the vein dermatotomy site. The catheter was brought through the tunnel. The vein dermatotomy site was dilated to accommodate a peel-away sheath. The catheter was placed through the peel-away sheath and directed into the central venous structures. The tip of the catheter was placed at the superior cavoatrial junction with fluoroscopy. Fluoroscopic images were obtained for documentation. Both lumens were found to aspirate and flush well. The proper amount of heparin was flushed in both lumens. The vein dermatotomy site was closed using a single layer of absorbable suture and Dermabond. The catheter was secured to the skin using Prolene suture. FINDINGS: Catheter tip at the superior cavoatrial junction. Estimated blood loss: Minimal COMPLICATIONS: None IMPRESSION:  Successful placement of a right jugular tunneled dialysis catheter using ultrasound and fluoroscopic guidance. Electronically Signed   By: Markus Daft M.D.   On: 05/17/2015 16:23   Ir US Guide Vasc Access Right  05/17/2015  INDICATION: Chronic kidney disease with early uremia. Need to start hemodialysis. EXAM: FLUOROSCOPIC AND ULTRASOUND GUIDED PLACEMENT OF A TUNNELED DIALYSIS CATHETER Physician: Stephan Minister. Henn, MD FLUOROSCOPY TIME:  42 seconds, 13.2 mGy MEDICATIONS: 2 g Ancef. As antibiotic prophylaxis, Ancef was ordered pre-procedure and administered intravenously within one hour of incision. 0.5 mg versed, 25 mcg fentanyl. A radiology nurse monitored the patient for moderate sedation. ANESTHESIA/SEDATION: Moderate sedation time: 30 minutes PROCEDURE: Informed consent was obtained for placement of a tunneled dialysis catheter. The patient was placed supine on the interventional table. Ultrasound confirmed a patent right internal jugularvein. Ultrasound images were obtained for documentation. The right side of the neck was prepped and draped in a sterile fashion. The right side of the neck was anesthetized with 1% lidocaine. Maximal barrier sterile technique was utilized including caps, mask, sterile gowns, sterile gloves, sterile drape, hand hygiene and skin antiseptic. A small incision was made with #11 blade scalpel. A 21 gauge needle directed into the right internal jugular vein with ultrasound guidance. A micropuncture dilator set was placed. A 23 cm tip to cuff Palindrome catheter was selected. The skin below the right clavicle was anesthetized and a small incision was made with an #11 blade scalpel. A subcutaneous tunnel was formed to the vein dermatotomy site. The catheter was brought through the tunnel. The vein dermatotomy site was dilated to accommodate a peel-away sheath. The catheter was placed through the peel-away sheath and directed into the central venous structures. The tip of the catheter was  placed at the superior cavoatrial junction with fluoroscopy. Fluoroscopic images were obtained for documentation. Both lumens were found to aspirate and flush well. The proper amount of heparin was flushed in both lumens. The vein dermatotomy  site was closed using a single layer of absorbable suture and Dermabond. The catheter was secured to the skin using Prolene suture. FINDINGS: Catheter tip at the superior cavoatrial junction. Estimated blood loss: Minimal COMPLICATIONS: None IMPRESSION: Successful placement of a right jugular tunneled dialysis catheter using ultrasound and fluoroscopic guidance. Electronically Signed   By: Markus Daft M.D.   On: 05/17/2015 16:23   Dg Chest Port 1 View  05/24/2015  CLINICAL DATA:  Shortness of breath. Finished hemodialysis 1 hour previously. EXAM: PORTABLE CHEST 1 VIEW COMPARISON:  05/13/2015. FINDINGS: Normal sized heart. Clear lungs. Interval right jugular catheter with its tip in the inferior aspect of the superior vena cava. No pneumothorax. Mild right basilar atelectasis and patchy opacity with mild progression. Clear left lung. Mild lower thoracic spine degenerative changes. IMPRESSION: Mild right basilar atelectasis and possible pneumonia. Electronically Signed   By: Claudie Revering M.D.   On: 05/24/2015 20:50    CBC  Recent Labs Lab 05/28/15 0513 05/31/15 0456 06/03/15 0634  WBC 15.0* 5.6 6.5  HGB 8.7* 7.5* 8.1*  HCT 27.1* 24.0* 24.7*  PLT 159 163 238  MCV 93.8 94.5 90.8  MCH 30.1 29.5 29.8  MCHC 32.1 31.3 32.8  RDW 16.0* 16.2* 16.2*    Chemistries   Recent Labs Lab 05/28/15 1030 05/29/15 0708 05/30/15 0720 05/31/15 0456 06/02/15 0427  NA 133* 134* 132* 131* 132*  K 3.8 4.6 4.6 4.8 4.0  CL 95* 93* 92* 91* 92*  CO2 26 28 29 28 27   GLUCOSE 216* 122* 90 107* 93  BUN 36* 26* 18 36* 38*  CREATININE 5.22* 4.43* 3.94* 6.13* 7.06*  CALCIUM 8.2* 8.6* 8.7* 8.6* 8.9    ------------------------------------------------------------------------------------------------------------------ estimated creatinine clearance is 9.3 mL/min (by C-G formula based on Cr of 7.06). ------------------------------------------------------------------------------------------------------------------ No results for input(s): HGBA1C in the last 72 hours. ------------------------------------------------------------------------------------------------------------------ No results for input(s): CHOL, HDL, LDLCALC, TRIG, CHOLHDL, LDLDIRECT in the last 72 hours. ------------------------------------------------------------------------------------------------------------------ No results for input(s): TSH, T4TOTAL, T3FREE, THYROIDAB in the last 72 hours.  Invalid input(s): FREET3 ------------------------------------------------------------------------------------------------------------------ No results for input(s): VITAMINB12, FOLATE, FERRITIN, TIBC, IRON, RETICCTPCT in the last 72 hours.  Coagulation profile  Recent Labs Lab 05/30/15 0720 05/31/15 0456 06/01/15 0517 06/02/15 0427 06/03/15 0634  INR 3.46* 3.32* 2.49* 2.18* 2.12*    No results for input(s): DDIMER in the last 72 hours.  Cardiac Enzymes No results for input(s): CKMB, TROPONINI, MYOGLOBIN in the last 168 hours.  Invalid input(s): CK ------------------------------------------------------------------------------------------------------------------ Invalid input(s): [place]  06/02/15 0816 06/02/15 1148 06/02/15 1655 06/02/15 2212 06/03/15 0758 06/03/15 1228  GLUCAP 80 129* 122* 121* 92 112*     Dyson Sevey M.D. [facility] 06/03/2015, 1:19 PM  Pager: [phone] Between 7am to 7pm - call Pager - [phone]  After 7pm go to [email or web address] - password [email or web address]  Call night coverage person covering after 7pm

## 2015-06-03 NOTE — Progress Notes (Signed)
Pt expresesd concern about his lactulose, pt said it makes him use the bathroom more frequent than  he wants to, he there wants his lactulose to be put to hold and he will request for it if he needs it

## 2015-06-03 NOTE — Care Management Note (Addendum)
Case Management Note  Patient Details  Name: Terry [name] MRN: [id number] Date of Birth: [birth year]  Subjective/Objective:   Patient is for dc tomorrow to home with [facility] services,  [facility] notified.                   Action/Plan:   Expected Discharge Date:                  Expected Discharge Plan:  Skilled Nursing Facility  In-House Referral:  Clinical Social Work  Discharge planning Services  CM Consult  Post Acute Care Choice:  Home Health Choice offered to:  Patient  DME Arranged:    DME Agency:     HH Arranged:  RN, PT, Nurse's Aide, Social Work [employer] Agency:  [place]  Status of Service:  Completed, signed off  Medicare Important Message Given:  Yes Date Medicare IM Given:    Medicare IM give by:    Date Additional Medicare IM Given:    Additional Medicare Important Message give by:     If discussed at [place] of Stay Meetings, dates discussed:    Additional Comments:  Zenon Mayo, RN 06/03/2015, 3:44 PM

## 2015-06-03 NOTE — Progress Notes (Signed)
PT Cancellation Note  Patient Details Name: DONTA STAGGERS MRN: [id number] DOB: [birth year]   Cancelled Treatment:    Reason Eval/Treat Not Completed: Patient at procedure or test/unavailable   Coriana Angello 06/03/2015, 2:12 PM  Pager [phone]

## 2015-06-03 NOTE — Progress Notes (Signed)
   VASCULAR SURGERY ASSESSMENT & PLAN:  * Paresthesias left 4th and 5th fingers may be from swelling or hematoma lateral arm. He has a palpable radial pulse and I agree with Dr. Trula Slade that this is not steal  *  Will be available as needed.  SUBJECTIVE: Still with paresthesias left 4th and 5th fingers  PHYSICAL EXAM: Filed Vitals:   06/02/15 1303 06/02/15 1608 06/02/15 2038 06/03/15 0831  BP:  129/49    Pulse:  92    Temp:  97.3 F (36.3 C)    TempSrc:      Resp:      Height:      Weight:      SpO2: 96% 95% 95% 95%   Palpable left radial pulse.   LABS: Lab Results  Component Value Date   WBC 6.5 06/03/2015   HGB 8.1* 06/03/2015   HCT 24.7* 06/03/2015   MCV 90.8 06/03/2015   PLT 238 06/03/2015   Lab Results  Component Value Date   CREATININE 7.06* 06/02/2015   Lab Results  Component Value Date   INR 2.12* 06/03/2015   CBG (last 3)   Recent Labs  06/02/15 1655 06/02/15 2212 06/03/15 0758  GLUCAP 122* 121* 92    Principal Problem:   COPD exacerbation (HCC) Active Problems:   CKD (chronic kidney disease) stage 4, GFR 15-29 ml/min (HCC)   CAD (coronary artery disease) of artery bypass graft   Essential hypertension   Acute on chronic kidney failure (HCC)   Acute respiratory failure with hypoxia (HCC)   Acute renal failure superimposed on stage 4 chronic kidney disease (HCC)   Acute diastolic CHF (congestive heart failure) ([place])   ESRD (end stage renal disease) ([place])   Chronic atrial fibrillation ([place])   Gae Gallop [id number] 06/03/2015

## 2015-06-04 LAB — PROTIME-INR
INR: 2.39 — AB (ref 0.00–1.49)
PROTHROMBIN TIME: 25.8 s — AB (ref 11.6–15.2)

## 2015-06-04 LAB — GLUCOSE, CAPILLARY
Glucose-Capillary: 106 mg/dL — ABNORMAL HIGH (ref 65–99)
Glucose-Capillary: 102 mg/dL — ABNORMAL HIGH (ref 65–99)

## 2015-06-04 MED ORDER — POLYETHYLENE GLYCOL 3350 17 G PO PACK: 17.0000 g | PACK | Freq: Every day | ORAL | Status: DC | PRN

## 2015-06-04 MED ORDER — IPRATROPIUM-ALBUTEROL 0.5-2.5 (3) MG/3ML IN SOLN: 3.0000 mL | Freq: Three times a day (TID) | RESPIRATORY_TRACT | Status: DC

## 2015-06-04 MED ORDER — MIDODRINE HCL 10 MG PO TABS: 10.0000 mg | ORAL_TABLET | Freq: Three times a day (TID) | ORAL | Status: DC

## 2015-06-04 MED ORDER — HYDROCODONE-ACETAMINOPHEN 5-325 MG PO TABS: 1.0000 | ORAL_TABLET | Freq: Four times a day (QID) | ORAL | Status: DC | PRN

## 2015-06-04 MED ORDER — BENZONATATE 200 MG PO CAPS: 200.0000 mg | ORAL_CAPSULE | Freq: Three times a day (TID) | ORAL | Status: DC | PRN

## 2015-06-04 MED ORDER — METOPROLOL TARTRATE 25 MG PO TABS: 25.0000 mg | ORAL_TABLET | Freq: Two times a day (BID) | ORAL | Status: DC

## 2015-06-04 MED ORDER — WARFARIN SODIUM 4 MG PO TABS: 4.0000 mg | ORAL_TABLET | Freq: Every day | ORAL | Status: DC

## 2015-06-04 MED ORDER — SENNOSIDES-DOCUSATE SODIUM 8.6-50 MG PO TABS: 2.0000 | ORAL_TABLET | Freq: Every evening | ORAL | Status: DC | PRN

## 2015-06-04 MED ORDER — METHOCARBAMOL 500 MG PO TABS: 500.0000 mg | ORAL_TABLET | Freq: Three times a day (TID) | ORAL | Status: DC | PRN

## 2015-06-04 MED ORDER — MAGIC MOUTHWASH: 5.0000 mL | Freq: Four times a day (QID) | ORAL | Status: DC

## 2015-06-04 MED ORDER — GUAIFENESIN-DM 100-10 MG/5ML PO SYRP: 5.0000 mL | ORAL_SOLUTION | ORAL | Status: DC | PRN

## 2015-06-04 MED ORDER — CLONAZEPAM 1 MG PO TABS: 1.0000 mg | ORAL_TABLET | Freq: Every day | ORAL | Status: DC | PRN

## 2015-06-04 NOTE — Progress Notes (Signed)
Physical Therapy Treatment Patient Details Name: Terry [name] MRN: [id number] DOB: 04/14/1935 Today's Date: 06/04/2015    History of Present Illness Pt is a 80 y/o M who presented w/ worsening cough and SOB dx w/ COPD exacerbation.  Pt's PMH includes stroke, CAD, a-fib.     PT Comments    Pt with minimal participation in today's session refusing transfer training or gait. Attempted education to address problem solving for home set-up to increase access and success with mobility in the home but pt unreceptive. Ultimately would recommend SNF for continued rehab to build up strength and independence with mobility but it appears pt has refused this and is planning for d/c home with with wife today. Due to this, recommending HHPT. Pt irritated with therapist throughout session.    Follow Up Recommendations  SNF;Supervision/Assistance - 24 hour;Home health PT (Due to pt refusal for SNF; recommend HHPT)     Equipment Recommendations  Wheelchair (measurements PT);Wheelchair cushion (measurements PT) (Pt has RW; recommend w/c for longer distances) 20x18 with basic cushion.   Recommendations for Other Services       Precautions / Restrictions Precautions Precautions: Fall Precaution Comments: monitor O2 Restrictions Weight Bearing Restrictions: No    Mobility  Bed Mobility               General bed mobility comments: Pt up in recliner.  Transfers                 General transfer comment: Pt refusing to attempt sit to stands or transfers/gait during session. He states "you wouldn't be able to move me." Education on provided on purpose of PT and pt reports he "knows how PT works" stating he went to a "concentration camp for 5 weeks in the past and it didn't help me."  Ambulation/Gait             General Gait Details: Pt refused.   Stairs            Wheelchair Mobility    Modified Rankin (Stroke Patients Only)       Balance                                    Cognition Arousal/Alertness: Awake/alert Behavior During Therapy: Anxious;Impulsive (irritated) Overall Cognitive Status: Difficult to assess Area of Impairment: Safety/judgement;Attention;Memory     Memory: Decreased short-term memory   Safety/Judgement: Decreased awareness of safety;Decreased awareness of deficits   Problem Solving: Requires verbal cues;Difficulty sequencing;Slow processing General Comments: Pt becoming irritated with therapist at suggestions for mobility during this session to prepare for d/c home. Attempted education and disussion in regards to home set-up and mobility but pt mad at therapist for asking multiple questions.     Exercises General Exercises - Lower Extremity Long Arc Quad: AROM;Strengthening;Both;20 reps;Seated Hip Flexion/Marching: AROM;Strengthening;Both;20 reps;Seated Toe Raises: AROM;Strengthening;Both;20 reps;Seated Heel Raises: AROM;Strengthening;20 reps;Seated    General Comments General comments (skin integrity, edema, etc.): Attempted to address home mobility and planning as it appears recommendation is SNF though pt is d/c home due to refusal.  Pt not receptive to therapist's suggestions or recommendations. End of session wife arrived and handout for HEP given to her. She also stated it is just best to leave him alone.       Pertinent Vitals/Pain Pain Assessment: Faces Faces Pain Scale: Hurts little more Pain Location: not described. "I keep coughing when you ask me all these questions":  Pain Intervention(s): Monitored during session;Limited activity within patient's tolerance    Home Living                      Prior Function            PT Goals (current goals can now be found in the care plan section) Acute Rehab PT Goals PT Goal Formulation: With patient Time For Goal Achievement: 06/13/15 Potential to Achieve Goals: Fair Progress towards PT goals: Progressing toward goals    Frequency  Min  2X/week    PT Plan Current plan remains appropriate    Co-evaluation             End of Session Equipment Utilized During Treatment: Oxygen Activity Tolerance: Patient limited by fatigue;Other (comment) (pt refusing to participate further) Patient left: in chair;with call bell/phone within reach;with family/visitor present     Time: [id number] PT Time Calculation (min) (ACUTE ONLY): 23 min  Charges:  $Therapeutic Exercise: 8-22 mins $Self Care/Home Management: 8-22                    G Codes:      Canary Brim Ivory Broad, PT, DPT  06/04/2015, 1:49 PM

## 2015-06-04 NOTE — Progress Notes (Signed)
SATURATION QUALIFICATIONS: (This note is used to comply with regulatory documentation for home oxygen)  Patient Saturations on Room Air at Rest = 88%  Patient Saturations on Room Air while Ambulating = 0%-pt. Refused walking, but was struggling to breathe while ambulating from the chair to [place]; states "I am not going to be able to make it to the door."  Patient Saturations on 0 Liters of oxygen while Ambulating = %  Please briefly explain why patient needs home oxygen: Pt. Unable to maintain saturation while at rest; may require 2-3L at home

## 2015-06-04 NOTE — Progress Notes (Signed)
[name] be D/C'd Home per MD order. Discussed with the patient and all questions fully answered.  VSS, Skin clean, dry and intact without evidence of skin break down, no evidence of skin tears noted. IV catheter discontinued intact. Site without signs and symptoms of complications. Dressing and pressure applied.  An After Visit Summary was printed and given to the patient. Prescriptions given to pt. wife  D/c education completed with patient/family including follow up instructions, medication list, d/c activities limitations if indicated, with other d/c instructions as indicated by MD - patient able to verbalize understanding, all questions fully answered.   Patient instructed to return to ED, call 911, or call MD for any changes in condition.   Patient escorted via wheelchair with nurse tech, and D/C home via private auto with significant other.

## 2015-06-04 NOTE — Discharge Summary (Signed)
Physician Discharge Summary   Patient ID: Terry [name] MRN: [id number] DOB/AGE: May 18, 1935 80 y.o.  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 COPD exacerbation ([place]) . acute respiratory failure with hypoxia  . acute diastolic CHF  . CAD (coronary artery disease) of artery bypass graft . hypotension   Atrial fibrillation    Diabetes mellitus type 2   Hyponatremia    Numbness right fourth and fifth finger, likely ulnar nerve neuropathy  Consults:   Nephrology Vascular surgery Cardiology  Recommendations for Outpatient Follow-up:  1. Patient needs PT/INR  for the dose adjustment for Coumadin 2. Patient qualified for 3 L home O2 3. Home health PT, OT, RN, HHA arranged 4. DME home nebs arranged 5. Please follow CBGs closely, if patient will need oral hypoglycemics 6. Eliquis discontinued     DIET: Renal diet    Allergies:   Allergies  Allergen Reactions  . Yellow Dyes (Non-Tartrazine) Other (See Comments)    Burns arms Cough medications (tussionex and benzonatate ok)  . Levaquin [Levofloxacin In D5w] Itching     DISCHARGE MEDICATIONS: Current Discharge Medication List    START taking these medications   Details  benzonatate (TESSALON) 200 MG capsule Take 1 capsule (200 mg total) by mouth 3 (three) times daily as needed for cough. Qty: 60 capsule, Refills: 0    clonazePAM (KLONOPIN) 1 MG tablet Take 1 tablet (1 mg total) by mouth daily as needed for anxiety (for HD treatment). Qty: 30 tablet, Refills: 0    guaiFENesin-dextromethorphan (ROBITUSSIN DM) 100-10 MG/5ML syrup Take 5 mLs by mouth every 4 (four) hours as needed for cough. Qty: 118 mL, Refills: 0    HYDROcodone-acetaminophen (NORCO/VICODIN) 5-325 MG tablet Take 1 tablet by mouth every 6 (six) hours as needed for moderate  pain or severe pain. Qty: 30 tablet, Refills: 0    ipratropium-albuterol (DUONEB) 0.5-2.5 (3) MG/3ML SOLN Take 3 mLs by nebulization 3 (three) times daily. Qty: 360 mL, Refills: 3    magic mouthwash SOLN Take 5 mLs by mouth 4 (four) times daily. X 10days Qty: 15 mL, Refills: 1    methocarbamol (ROBAXIN) 500 MG tablet Take 1 tablet (500 mg total) by mouth every 8 (eight) hours as needed for muscle spasms. Qty: 90 tablet, Refills: 1    midodrine (PROAMATINE) 10 MG tablet Take 1 tablet (10 mg total) by mouth 3 (three) times daily with meals. Qty: 90 tablet, Refills: 3    polyethylene glycol (MIRALAX) packet Take 17 g by mouth daily as needed for moderate constipation. Qty: 30 each, Refills: 0    senna-docusate (SENOKOT S) 8.6-50 MG tablet Take 2 tablets by mouth at bedtime as needed for mild constipation. Qty: 60 tablet, Refills: 3    warfarin (COUMADIN) 4 MG tablet Take 1 tablet (4 mg total) by mouth daily at 6 PM. Dose needs to be adjusted by your doctor Qty: 30 tablet, Refills: 0      CONTINUE these medications which have CHANGED   Details  metoprolol tartrate (LOPRESSOR) 25 MG tablet Take 1 tablet (25 mg total) by mouth 2 (two) times daily. Qty: 60 tablet, Refills: 3      CONTINUE these medications which have NOT CHANGED   Details  albuterol (VENTOLIN HFA) 108 (90 BASE) MCG/ACT inhaler Inhale 2 puffs into the lungs  every 4 (four) hours as needed for wheezing or shortness of breath. Qty: 1 Inhaler, Refills: 0    atorvastatin (LIPITOR) 20 MG tablet Take 1 tablet (20 mg total) by mouth daily. Qty: 30 tablet, Refills: 6    budesonide-formoterol (SYMBICORT) 160-4.5 MCG/ACT inhaler Inhale 2 puffs into the lungs 2 (two) times daily. Qty: 1 Inhaler, Refills: 6    tiotropium (SPIRIVA HANDIHALER) 18 MCG inhalation capsule INHALE THE CONTENTS OF ONE CAPSULE INTO LUNGS ONCE DAILY Qty: 30 capsule, Refills: 3      STOP taking these medications     amLODipine (NORVASC) 10 MG tablet       cloNIDine (CATAPRES) 0.1 MG tablet      doxazosin (CARDURA) 4 MG tablet      ELIQUIS 5 MG TABS tablet      torsemide (DEMADEX) 20 MG tablet      predniSONE (DELTASONE) 20 MG tablet          Brief H and P: For complete details please refer to admission H and P, but in brief 80 year old male with a history of COPD, hypertension, CAD/CABG, CKD stage IV, atrial fibrillation diagnosed October 2015 on apixaban presented with one-week history of worsening cough and shortness of breath. The patient was seen by his primary care provider 4 days prior to admission and prescribed Augmentin and prednisone without improvement. He presented to the emergency department where he was noted to have oxygen saturation of 80% on room air and found to be in respiratory distress. The patient was started on doxycycline and Solu-Medrol. The patient has also been describing symptoms of orthopnea and PND with increasing abdominal girth and lower extremity edema for the past week prior to admission. After admission, the patient was started on intravenous steroids. His breathing did not improve. He subsequently started on intravenous furosemide for volume excess. Ultimately, the patient was declared ESRD and dialysis catheter was placed and dialysis initiated on 05/17/2015.  Hospital Course:  Acute respiratory failure with hypoxia, COPD exacerbation, bronchitis and volume overload - Secondary to CHF and COPD exacerbation, volume overload -Patient has received 12 days of IV antibiotics, does not need any further antibiotics - Continue Tessalon and symptomatic treatment for cough. -Please obtain chest x-ray in 3 weeks to ensure complete resolution of pneumonia   Acute diastolic CHF , new start hemodialysis with ESRD - Patient had a prolonged hospital stay requiring back-to-back HD. Furosemide was discontinued as patient is started on HD.  - 2-D echo on 05/16/15 showed EF 55-60%, trivial MR/TR - Weight 208  pounds on 11/22 -> 192 - Volume now managed with HD, low-sodium diet however BP also limiting factor.    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 Nephrology and vascular surgery was consulted. Patient underwent right AV fistula placement on 05/21/15. PermCath was placed by radiology on 05/17/15 -Patient started on hemodialysis, needs Klonopin 1 mg prior to HD for anxiety and Robaxin for cramping.  - CLIPPED with [facility] HD outpatient unit, schedule Monday Wednesday Friday, next hemodialysis on Wednesday, 12/14 at 10:30 AM. - PT recommending skilled nursing facility, very deconditioned, wife works during the day, discussed with social work, however patient refused skilled nursing facility - Renal ultrasound showed multiple small renal cysts, no hydronephrosis or renal obstruction or any hemorrhage. - BP has remained a limiting factor for hemodialysis, cardiology was consulted and recommended to continue beta blocker for permanent atrial fibrillation, not a candidate for  amiodarone. Patient was also placed on midodrine.  COPD exacerbation  -Patient has >60 pk years, completed steroid tapering and antibiotics, continue bronchodilators.   Atrial fibrillation  - CHADS-VASc = 6 - Discontinued apixaban due to worsening renal function/new ESRD - Continue Coumadin and dose will be adjusted by PCP, patient has a follow-up appointment arranged on 06/11/15. - Patient is on metoprolol tartrate for rate control however this is also causing hypotension and limiting factor for hemodialysis. Per cardiology no other options at this time, continue metoprolol and midodrine. - INR 2.39 at the time of discharge  hypotension with history of Hypertension -Continue metoprolol, midodrine -HD helping with volume and BP control  Diabetes mellitus type 2 -Checked hemoglobin A1c--6.6, pt agreeable to start po meds upon discharge.  However patient did not require sliding scale insulin and blood sugars has remained controlled.  Coronary artery disease -Cardiac cath 11/30/1997. Mild distal Left main stenosis, 25% proximal LAD, minor irregularities Circumflex, 95% mid RCA stenosis treated with 3.5 x 32 mm bare metal stent -no angina presently  Hyponatremia -Stable, Secondary to worsening renal function and fluid overload, improving with HD  Numbness right fourth and fifth finger - Recommend outpatient workup for carpal tunnel syndrome or ulnar nerve neuropathy.  Patient was seen by vascular surgery who also agrees that this is not steal syndrome, and is likely neuropathy  Diarrhea resolved Likely due to laxatives, enema.   Day of Discharge BP 150/98 mmHg  Pulse 105  Temp(Src) 97.6 F (36.4 C) (Oral)  Resp 20  Ht 5\' 8"  (1.727 m)  Wt 87.3 kg (192 lb 7.4 oz)  BMI 29.27 kg/m2  SpO2 94%  Physical Exam: General: Alert and awake oriented x3 not in any acute distress. HEENT: anicteric sclera, pupils reactive to light and accommodation CVS: S1-S2 clear no murmur rubs or gallops Chest: clear to auscultation bilaterally, no wheezing rales or rhonchi Abdomen: soft nontender, nondistended, normal bowel sounds Extremities: no cyanosis, clubbing, 2+ edema noted bilaterally Neuro: Cranial nerves II-XII intact, no focal neurological deficits   The results of significant diagnostics from this hospitalization (including imaging, microbiology, ancillary and laboratory) are listed below for reference.    LAB RESULTS: Basic Metabolic Panel:  Recent Labs Lab 06/02/15 0427 06/03/15 1515  NA 132* 125*  K 4.0 4.5  CL 92* 86*  CO2 27 24  GLUCOSE 93 141*  BUN 38* 55*  CREATININE 7.06* 8.99*  CALCIUM 8.9 8.6*  PHOS 4.4 5.0*   Liver Function Tests:  Recent Labs Lab 06/02/15 0427 06/03/15 1515  ALBUMIN 3.4* 3.2*   No results for input(s): LIPASE, AMYLASE in the last 168 hours. No results for input(s): AMMONIA in  the last 168 hours. CBC:  Recent Labs Lab 05/31/15 0456 06/03/15 0634  WBC 5.6 6.5  HGB 7.5* 8.1*  HCT 24.0* 24.7*  MCV 94.5 90.8  PLT 163 238   Cardiac Enzymes: No results for input(s): CKTOTAL, CKMB, CKMBINDEX, TROPONINI in the last 168 hours. BNP: Invalid input(s): POCBNP CBG:  Recent Labs Lab 06/03/15 2152 06/04/15 0816  GLUCAP 170* 106*    Significant Diagnostic Studies:  Dg Chest 2 View  05/13/2015  CLINICAL DATA:  80 year old with 1 week history of productive cough and severe shortness of breath. Former smoker with current history of COPD. EXAM: CHEST  2 VIEW COMPARISON:  05/28/2014 and earlier. FINDINGS: AP erect and lateral imaging was performed. Cardiac silhouette mildly enlarged with a right coronary artery stent. Thoracic aorta atherosclerotic, unchanged. Prominent central pulmonary arteries, unchanged. Scarring  in the lower lobes, right greater than left, unchanged. Hyperinflation, emphysematous changes in the upper lobes, and mild central peribronchial thickening, unchanged. No new pulmonary parenchymal abnormalities. Degenerative disc disease and spondylosis involving the thoracic spine. IMPRESSION: COPD/emphysema. Scarring in the lower lobes, right greater than left. No acute cardiopulmonary disease. Stable examination. Electronically Signed   By: Evangeline Dakin M.D.   On: 05/13/2015 21:03    2D ECHO:   Disposition and Follow-up: Discharge Instructions    Diet Carb Modified    Complete by:  As directed      Increase activity slowly    Complete by:  As directed             DISPOSITION: [place] Follow-up Information    Follow up with Deitra Mayo, MD In 6 weeks.   Specialties:  Vascular Surgery, Cardiology   Why:  Office will call you to arrange your appt (sent)   Contact information:   [place] [address] [phone]       Follow up with [facility].   Why:  HHRN, PT, aide and  Social Radio producer information:   [address] [place] [address] [phone]       Follow up with Colin Benton R., DO On 06/11/2015.   Specialty:  Family Medicine   Why:  for hospital follow-up, obtain labs for PT/INR for coumadin dosing// Appointment with Dr. Maudie Mercury is on 06/11/15 at 1:45pm   Contact information:   [place] [address] [phone]        Time spent on Discharge: 67mins   Signed:   Semir Brill M.D. [facility] 06/04/2015, 10:30 AM Pager: [phone]

## 2015-06-04 NOTE — Progress Notes (Signed)
Patient ID: Terry [name], male   DOB: 11-Nov-1934, 79 y.o.   MRN: [id number] S:Feels a little better but still weak.  Reports that he is now considering SNF placement to help with conditioning O:BP 150/98 mmHg  Pulse 105  Temp(Src) 97.6 F (36.4 C) (Oral)  Resp 20  Ht 5\' 8"  (1.727 m)  Wt 87.3 kg (192 lb 7.4 oz)  BMI 29.27 kg/m2  SpO2 94%  Intake/Output Summary (Last 24 hours) at 06/04/15 1321 Last data filed at 06/04/15 1039  Gross per 24 hour  Intake    880 ml  Output   4000 ml  Net  -3120 ml   Intake/Output: I/O last 3 completed shifts: In: 880 [P.O.:320; IV Piggyback:560] Out: 4000 [Other:4000]  Intake/Output this shift:  Total I/O In: 220 [P.O.:220] Out: -  Weight change:  Gen:WD WM in NAd CVS:no rub Resp:cta [id number] Ext:2+ edema, RAVF +T/B   Recent Labs Lab 05/29/15 0708 05/30/15 0720 05/31/15 0456 06/02/15 0427 06/03/15 1515  NA 134* 132* 131* 132* 125*  K 4.6 4.6 4.8 4.0 4.5  CL 93* 92* 91* 92* 86*  CO2 28 29 28 27 24   GLUCOSE 122* 90 107* 93 141*  BUN 26* 18 36* 38* 55*  CREATININE 4.43* 3.94* 6.13* 7.06* 8.99*  ALBUMIN  --   --   --  3.4* 3.2*  CALCIUM 8.6* 8.7* 8.6* 8.9 8.6*  PHOS  --   --   --  4.4 5.0*   Liver Function Tests:  Recent Labs Lab 06/02/15 0427 06/03/15 1515  ALBUMIN 3.4* 3.2*   No results for input(s): LIPASE, AMYLASE in the last 168 hours. No results for input(s): AMMONIA in the last 168 hours. CBC:  Recent Labs Lab 05/31/15 0456 06/03/15 0634  WBC 5.6 6.5  HGB 7.5* 8.1*  HCT 24.0* 24.7*  MCV 94.5 90.8  PLT 163 238   Cardiac Enzymes: No results for input(s): CKTOTAL, CKMB, CKMBINDEX, TROPONINI in the last 168 hours. CBG:  Recent Labs Lab 06/03/15 1228 06/03/15 1849 06/03/15 2152 06/04/15 0816 06/04/15 1202  GLUCAP 112* 95 170* 106* 102*    Iron Studies: No results for input(s): IRON, TIBC, TRANSFERRIN, FERRITIN in the last 72 hours. Studies/Results: No results found. [name] atorvastatin  20 mg Oral q1800   . benzonatate  200 mg Oral TID  . budesonide-formoterol  2 puff Inhalation BID  . darbepoetin (ARANESP) injection - DIALYSIS  200 mcg Intravenous Q Mon-HD  . doxercalciferol  1 mcg Intravenous Q M,W,F-HD  . ferric gluconate (FERRLECIT/NULECIT) IV  125 mg Intravenous Q M,W,F-HD  . insulin aspart  0-15 Units Subcutaneous TID WC  . insulin aspart  0-5 Units Subcutaneous QHS  . ipratropium-albuterol  3 mL Nebulization TID  . magic mouthwash  5 mL Oral QID  . metoprolol  25 mg Oral BID  . midodrine  10 mg Oral TID WC  . piperacillin-tazobactam (ZOSYN)  IV  2.25 g Intravenous 3 times per day  . sodium chloride  500 mL Intravenous Once  . sodium chloride  3 mL Intravenous Q12H  . tiotropium  18 mcg Inhalation Daily  . vancomycin  1,000 mg Intravenous Q M,W,F-HD  . warfarin  4 mg Oral q1800  . Warfarin - Pharmacist Dosing Inpatient   Does not apply q1800    BMET    Component Value Date/Time   NA 125* 06/03/2015 1515   K 4.5 06/03/2015 1515   CL 86* 06/03/2015 1515   CO2 24 06/03/2015 1515   GLUCOSE  141* 06/03/2015 1515   BUN 55* 06/03/2015 1515   CREATININE 8.99* 06/03/2015 1515   CREATININE 2.62* 05/25/2014 1711   CALCIUM 8.6* 06/03/2015 1515   GFRNONAA 5* 06/03/2015 1515   GFRAA 6* 06/03/2015 1515   CBC    Component Value Date/Time   WBC 6.5 06/03/2015 0634   RBC 2.72* 06/03/2015 0634   HGB 8.1* 06/03/2015 0634   HCT 24.7* 06/03/2015 0634   PLT 238 06/03/2015 0634   MCV 90.8 06/03/2015 0634   MCH 29.8 06/03/2015 0634   MCHC 32.8 06/03/2015 0634   RDW 16.2* 06/03/2015 0634   LYMPHSABS 0.3* 05/15/2015 0017   MONOABS 0.2 05/15/2015 0017   EOSABS 0.0 05/15/2015 0017   BASOSABS 0.0 05/15/2015 0017     Background per Dr. Sanda Klein Progress Note on 06/02/15 80 with over 89yr HTN, DJD, severe COPD, CAD, AF, prior ureteral stents, bilateral renal cysts, and known advancing late stage 4 CKD. Baseline Cr 3.9. Adm with cough, SOB, edema, worsening renal failure requiring  initiation of dialysis --->new ESRD.  Assessment/Plan:  1. New start ESRD, dialysis dependent. 1st HD 11/26. S/p AVF RUA Dr. Scot Dock 11/29. [facility] 11/25. CLIPPED for [facility] MWF 2nd shift. Probs with low BP and fluid removal.  1. Continue to challenge EDW 2. Has to stay on BB per cards for permanent AF per cards.  3. Will just have to continue with midodrine. HD MWF 2. Anemia on Darbe 200 QThurs Last received 12/2. Since MWF changed to QFriday. TSat 7% on 12/6. Hb down to 7.5. Rec'd feraheme earlier in the admission but tsat remains low. Started Ferrlecit with HD on 12/9 - total dose planned will be 1 gram 3. HPTH on vit D - PTH 159 on 11/23. Hectorol 1 mcg TIW on MWF with HD.  4. COPD longstanding - bronchodilators 5. Diastolic heart failure. LVEF 55-60.  6. AFib -- coumadin. Pharmacy dosing. Metoprolol for rate control - Cards says no amio since AF is permanent and not paroxysmal so will have to continue the BB. He will need to have his coumadin followed either by PCP or cardiology at discharge - dialysis unit is not set up to manage  7. DJD 8. CHF/Vol overload - having problems getting volume off. Doing the best we can given BP limitations. Highest weight this admission ~ 99.8 kg. We are down in the 86.5-87.5 range now but we are still nowhere near a dry weight. Got 3 liters off on Friday with HD - better than usual. 9. DM2 10. Cough with CXR 12/4 compressive atelectasis right lung/leukocytosis. Being treated as HCAP. Still on vanco and zosyn. His sputum is still discolored. Still coughing. Still O2 dependent. Repeat CXR 12/10 c/w RLL PNA. 11. MRSA PCR + 12. Obstipation/dry hard stool - change laxatives to daily 13. Urgency - bladder scan/cath urine for ua and culture 14. Right hand numbness - pinky and ring finger. Access arm. Feels a little cooler than the rest of his hand today. Numb X 3 days he says. Called Dr. Trula Slade - he will evaluate. 55. Dispo - He is refusing SNF. Very  deconditioned.  Is now considering SNF placement but will await final decision as this seems to be counter to what he has said in the past.  Yamil Dougher A

## 2015-06-04 NOTE — Care Management Note (Signed)
Case Management Note  Patient Details  Name: MABLE THEDFORD MRN: [id number] Date of Birth: July 08, 1934  Subjective/Objective:   NCM spoke with wife, she will be here to transport patient home, she also states she will be taking a leave from work to be with him until he gets stronger.  Patient will need home oxygen and neb machine, referral made to [facility] with [facility], oxygen tank has been brought up to patient's room and the nebulizer machine will be delivered with the home  Oxygen.  [facility] is notified that patient is for dc today.                   Action/Plan:   Expected Discharge Date:                  Expected Discharge Plan:  Skilled Nursing Facility  In-House Referral:  Clinical Social Work  Discharge planning Services  CM Consult  Post Acute Care Choice:  Home Health Choice offered to:  Patient  DME Arranged:    DME Agency:     HH Arranged:  RN, PT, Nurse's Aide, Social Work, OT [employer] Agency:  [place]  Status of Service:  Completed, signed off  Medicare Important Message Given:  Yes Date Medicare IM Given:    Medicare IM give by:    Date Additional Medicare IM Given:    Additional Medicare Important Message give by:     If discussed at [place] of Stay Meetings, dates discussed:    Additional Comments:  Zenon Mayo, RN 06/04/2015, 11:56 AM

## 2015-06-05 DIAGNOSIS — N186 End stage renal disease: Secondary | ICD-10-CM | POA: Diagnosis not present

## 2015-06-06 ENCOUNTER — Telehealth: Payer: Self-pay | Admitting: *Deleted

## 2015-06-06 ENCOUNTER — Telehealth: Payer: Self-pay | Admitting: Cardiovascular Disease

## 2015-06-06 DIAGNOSIS — N186 End stage renal disease: Secondary | ICD-10-CM | POA: Diagnosis not present

## 2015-06-06 DIAGNOSIS — I129 Hypertensive chronic kidney disease with stage 1 through stage 4 chronic kidney disease, or unspecified chronic kidney disease: Secondary | ICD-10-CM | POA: Diagnosis not present

## 2015-06-06 DIAGNOSIS — I2510 Atherosclerotic heart disease of native coronary artery without angina pectoris: Secondary | ICD-10-CM | POA: Diagnosis not present

## 2015-06-06 DIAGNOSIS — E1122 Type 2 diabetes mellitus with diabetic chronic kidney disease: Secondary | ICD-10-CM | POA: Diagnosis not present

## 2015-06-06 DIAGNOSIS — J441 Chronic obstructive pulmonary disease with (acute) exacerbation: Secondary | ICD-10-CM | POA: Diagnosis not present

## 2015-06-06 DIAGNOSIS — I5031 Acute diastolic (congestive) heart failure: Secondary | ICD-10-CM | POA: Diagnosis not present

## 2015-06-06 MED ORDER — AMLODIPINE BESYLATE 10 MG PO TABS: 10.0000 mg | ORAL_TABLET | Freq: Every day | ORAL | Status: DC

## 2015-06-06 NOTE — Telephone Encounter (Signed)
Spoke with pt and wife and advised them of information below per Dr. [name]. Sent prescription to preferred pharmacy. Advised pt and wife to continue monitoring BP at home as well. Wife states that home health nurse should start coming by today and she will check BP's regularly. Advised wife to have [facility] nurse to check their home device against her readings to see if machine is accurate. Wife and pt verbalized understanding and was in agreement with this plan.

## 2015-06-06 NOTE — Telephone Encounter (Addendum)
Spoke with pt and received permission to speak with wife. Wife states that pt takes Metoprolol Tartrate 25 BID but dialysis told pt to not take it on his dialysis days (M-W-F) because his BP drops at dialysis. Wife states pt was previously on Metoprolol Tartrate 100mg  BID. Advised wife BP dropping at dialysis is not unusual. Wife states that Amlodipine was d/c'ed after hospital stay.  BP taken while on the phone with me and it was noted to be 163/60, HR 96. Wife states that pt has been taken off of Amlodipine 1-2 other times and it caused his BP to shoot up and he ended up hospitalized. Wife concerned about BP possibly getting too high on non dialysis days with pt off of Amlodipine and on the lower dose of Metoprolol. Wife states that meds were changed because pt's BP stayed low in the hospital but he was on dialysis everyday and now he isn't. Pt denies CP, lightheadedness, dizziness or abnormal SOB (Pt has COPD so always has some SOB). Will route to Dr. [name] for review and advisement.

## 2015-06-06 NOTE — Telephone Encounter (Signed)
New Message     Pt's wife calling stating that the pt has questions about his medications and just needs some clarification. Please call back and advise.

## 2015-06-06 NOTE — Telephone Encounter (Signed)
Transition Care Management Follow-up Telephone Call  How have you been since you were released from the hospital? Little better   Do you understand why you were in the hospital? yes   Do you understand the discharge instrcutions? yes  Items Reviewed:  Medications reviewed: yes  Allergies reviewed: yes  Dietary changes reviewed: yes  Referrals reviewed: yes   Functional Questionnaire:   Activities of Daily Living (ADLs):   He states they are independent in the following: ambulation, bathing and hygiene, feeding, continence, grooming, toileting and dressing States they require assistance with the following: none   Any transportation issues/concerns?: no   Any patient concerns? yes   Confirmed importance and date/time of follow-up visits scheduled: yes   Confirmed with patient if condition begins to worsen call PCP or go to the ER.  Patient was given the Call-a-Nurse line [phone]: yes Patient was discharged 06/04/15 Patient was discharged to home Patient has an appointment 20/2016

## 2015-06-06 NOTE — Telephone Encounter (Signed)
He can resume his Norvasc at previous dose. His BP can be followed in dialysis and adjustments made by the Nephrologist if BP remains high. Terry [name]

## 2015-06-07 ENCOUNTER — Emergency Department (HOSPITAL_COMMUNITY): Payer: Medicare PPO

## 2015-06-07 ENCOUNTER — Telehealth: Payer: Self-pay | Admitting: Internal Medicine

## 2015-06-07 ENCOUNTER — Inpatient Hospital Stay (HOSPITAL_COMMUNITY): Payer: Medicare PPO

## 2015-06-07 ENCOUNTER — Encounter (HOSPITAL_COMMUNITY): Payer: Self-pay | Admitting: Emergency Medicine

## 2015-06-07 ENCOUNTER — Inpatient Hospital Stay (HOSPITAL_COMMUNITY)
Admission: EM | Admit: 2015-06-07 | Discharge: 2015-07-04 | DRG: 870 | Disposition: A | Discharge status: Skilled Nursing Facility | Payer: Medicare PPO | Attending: Internal Medicine | Admitting: Internal Medicine

## 2015-06-07 DIAGNOSIS — J930 Spontaneous tension pneumothorax: Secondary | ICD-10-CM | POA: Diagnosis not present

## 2015-06-07 DIAGNOSIS — N2581 Secondary hyperparathyroidism of renal origin: Secondary | ICD-10-CM | POA: Diagnosis not present

## 2015-06-07 DIAGNOSIS — R197 Diarrhea, unspecified: Secondary | ICD-10-CM | POA: Diagnosis present

## 2015-06-07 DIAGNOSIS — A419 Sepsis, unspecified organism: Secondary | ICD-10-CM | POA: Diagnosis not present

## 2015-06-07 DIAGNOSIS — J150 Pneumonia due to Klebsiella pneumoniae: Secondary | ICD-10-CM | POA: Diagnosis present

## 2015-06-07 DIAGNOSIS — I482 Chronic atrial fibrillation, unspecified: Secondary | ICD-10-CM | POA: Diagnosis present

## 2015-06-07 DIAGNOSIS — K922 Gastrointestinal hemorrhage, unspecified: Secondary | ICD-10-CM | POA: Diagnosis present

## 2015-06-07 DIAGNOSIS — Z4659 Encounter for fitting and adjustment of other gastrointestinal appliance and device: Secondary | ICD-10-CM

## 2015-06-07 DIAGNOSIS — D689 Coagulation defect, unspecified: Secondary | ICD-10-CM | POA: Diagnosis not present

## 2015-06-07 DIAGNOSIS — Z955 Presence of coronary angioplasty implant and graft: Secondary | ICD-10-CM

## 2015-06-07 DIAGNOSIS — J9690 Respiratory failure, unspecified, unspecified whether with hypoxia or hypercapnia: Secondary | ICD-10-CM

## 2015-06-07 DIAGNOSIS — I25810 Atherosclerosis of coronary artery bypass graft(s) without angina pectoris: Secondary | ICD-10-CM | POA: Diagnosis present

## 2015-06-07 DIAGNOSIS — Z7901 Long term (current) use of anticoagulants: Secondary | ICD-10-CM | POA: Diagnosis not present

## 2015-06-07 DIAGNOSIS — J9601 Acute respiratory failure with hypoxia: Secondary | ICD-10-CM | POA: Diagnosis present

## 2015-06-07 DIAGNOSIS — I132 Hypertensive heart and chronic kidney disease with heart failure and with stage 5 chronic kidney disease, or end stage renal disease: Secondary | ICD-10-CM | POA: Diagnosis present

## 2015-06-07 DIAGNOSIS — Y95 Nosocomial condition: Secondary | ICD-10-CM | POA: Diagnosis present

## 2015-06-07 DIAGNOSIS — J850 Gangrene and necrosis of lung: Secondary | ICD-10-CM | POA: Diagnosis present

## 2015-06-07 DIAGNOSIS — I25700 Atherosclerosis of coronary artery bypass graft(s), unspecified, with unstable angina pectoris: Secondary | ICD-10-CM | POA: Diagnosis not present

## 2015-06-07 DIAGNOSIS — Z1624 Resistance to multiple antibiotics: Secondary | ICD-10-CM | POA: Diagnosis present

## 2015-06-07 DIAGNOSIS — J440 Chronic obstructive pulmonary disease with acute lower respiratory infection: Secondary | ICD-10-CM | POA: Diagnosis present

## 2015-06-07 DIAGNOSIS — K5900 Constipation, unspecified: Secondary | ICD-10-CM | POA: Diagnosis not present

## 2015-06-07 DIAGNOSIS — K921 Melena: Secondary | ICD-10-CM | POA: Diagnosis not present

## 2015-06-07 DIAGNOSIS — E875 Hyperkalemia: Secondary | ICD-10-CM | POA: Diagnosis not present

## 2015-06-07 DIAGNOSIS — R011 Cardiac murmur, unspecified: Secondary | ICD-10-CM | POA: Diagnosis present

## 2015-06-07 DIAGNOSIS — F419 Anxiety disorder, unspecified: Secondary | ICD-10-CM | POA: Diagnosis not present

## 2015-06-07 DIAGNOSIS — J811 Chronic pulmonary edema: Secondary | ICD-10-CM | POA: Diagnosis not present

## 2015-06-07 DIAGNOSIS — J156 Pneumonia due to other aerobic Gram-negative bacteria: Secondary | ICD-10-CM | POA: Diagnosis present

## 2015-06-07 DIAGNOSIS — I272 Other secondary pulmonary hypertension: Secondary | ICD-10-CM | POA: Diagnosis present

## 2015-06-07 DIAGNOSIS — Z9981 Dependence on supplemental oxygen: Secondary | ICD-10-CM

## 2015-06-07 DIAGNOSIS — J9691 Respiratory failure, unspecified with hypoxia: Secondary | ICD-10-CM

## 2015-06-07 DIAGNOSIS — Z8673 Personal history of transient ischemic attack (TIA), and cerebral infarction without residual deficits: Secondary | ICD-10-CM | POA: Diagnosis not present

## 2015-06-07 DIAGNOSIS — N186 End stage renal disease: Secondary | ICD-10-CM | POA: Diagnosis present

## 2015-06-07 DIAGNOSIS — N170 Acute kidney failure with tubular necrosis: Secondary | ICD-10-CM | POA: Diagnosis not present

## 2015-06-07 DIAGNOSIS — Z6826 Body mass index (BMI) 26.0-26.9, adult: Secondary | ICD-10-CM | POA: Diagnosis not present

## 2015-06-07 DIAGNOSIS — D696 Thrombocytopenia, unspecified: Secondary | ICD-10-CM | POA: Diagnosis not present

## 2015-06-07 DIAGNOSIS — Z992 Dependence on renal dialysis: Secondary | ICD-10-CM | POA: Diagnosis not present

## 2015-06-07 DIAGNOSIS — Z9911 Dependence on respirator [ventilator] status: Secondary | ICD-10-CM | POA: Diagnosis not present

## 2015-06-07 DIAGNOSIS — E785 Hyperlipidemia, unspecified: Secondary | ICD-10-CM | POA: Diagnosis present

## 2015-06-07 DIAGNOSIS — Z789 Other specified health status: Secondary | ICD-10-CM | POA: Diagnosis not present

## 2015-06-07 DIAGNOSIS — J441 Chronic obstructive pulmonary disease with (acute) exacerbation: Secondary | ICD-10-CM | POA: Diagnosis present

## 2015-06-07 DIAGNOSIS — J9621 Acute and chronic respiratory failure with hypoxia: Secondary | ICD-10-CM | POA: Diagnosis present

## 2015-06-07 DIAGNOSIS — J189 Pneumonia, unspecified organism: Secondary | ICD-10-CM | POA: Diagnosis present

## 2015-06-07 DIAGNOSIS — J939 Pneumothorax, unspecified: Secondary | ICD-10-CM | POA: Diagnosis not present

## 2015-06-07 DIAGNOSIS — Z515 Encounter for palliative care: Secondary | ICD-10-CM | POA: Insufficient documentation

## 2015-06-07 DIAGNOSIS — Z4682 Encounter for fitting and adjustment of non-vascular catheter: Secondary | ICD-10-CM

## 2015-06-07 DIAGNOSIS — I2510 Atherosclerotic heart disease of native coronary artery without angina pectoris: Secondary | ICD-10-CM | POA: Diagnosis present

## 2015-06-07 DIAGNOSIS — T148XXA Other injury of unspecified body region, initial encounter: Secondary | ICD-10-CM

## 2015-06-07 DIAGNOSIS — E46 Unspecified protein-calorie malnutrition: Secondary | ICD-10-CM | POA: Diagnosis not present

## 2015-06-07 DIAGNOSIS — I5033 Acute on chronic diastolic (congestive) heart failure: Secondary | ICD-10-CM | POA: Diagnosis not present

## 2015-06-07 DIAGNOSIS — R0602 Shortness of breath: Secondary | ICD-10-CM | POA: Diagnosis not present

## 2015-06-07 DIAGNOSIS — D62 Acute posthemorrhagic anemia: Secondary | ICD-10-CM | POA: Diagnosis present

## 2015-06-07 DIAGNOSIS — E1122 Type 2 diabetes mellitus with diabetic chronic kidney disease: Secondary | ICD-10-CM | POA: Diagnosis not present

## 2015-06-07 DIAGNOSIS — R0902 Hypoxemia: Secondary | ICD-10-CM

## 2015-06-07 DIAGNOSIS — D638 Anemia in other chronic diseases classified elsewhere: Secondary | ICD-10-CM | POA: Diagnosis not present

## 2015-06-07 DIAGNOSIS — J1569 Pneumonia due to other gram-negative bacteria: Secondary | ICD-10-CM | POA: Diagnosis present

## 2015-06-07 DIAGNOSIS — Z9289 Personal history of other medical treatment: Secondary | ICD-10-CM | POA: Insufficient documentation

## 2015-06-07 DIAGNOSIS — M1990 Unspecified osteoarthritis, unspecified site: Secondary | ICD-10-CM | POA: Diagnosis present

## 2015-06-07 DIAGNOSIS — R0989 Other specified symptoms and signs involving the circulatory and respiratory systems: Secondary | ICD-10-CM | POA: Diagnosis not present

## 2015-06-07 DIAGNOSIS — E8770 Fluid overload, unspecified: Secondary | ICD-10-CM | POA: Diagnosis not present

## 2015-06-07 DIAGNOSIS — J449 Chronic obstructive pulmonary disease, unspecified: Secondary | ICD-10-CM | POA: Diagnosis not present

## 2015-06-07 DIAGNOSIS — R1310 Dysphagia, unspecified: Secondary | ICD-10-CM | POA: Diagnosis not present

## 2015-06-07 DIAGNOSIS — Z87891 Personal history of nicotine dependence: Secondary | ICD-10-CM

## 2015-06-07 DIAGNOSIS — D72829 Elevated white blood cell count, unspecified: Secondary | ICD-10-CM | POA: Diagnosis not present

## 2015-06-07 DIAGNOSIS — J9811 Atelectasis: Secondary | ICD-10-CM | POA: Diagnosis not present

## 2015-06-07 DIAGNOSIS — Z01818 Encounter for other preprocedural examination: Secondary | ICD-10-CM | POA: Diagnosis present

## 2015-06-07 DIAGNOSIS — R918 Other nonspecific abnormal finding of lung field: Secondary | ICD-10-CM | POA: Diagnosis not present

## 2015-06-07 DIAGNOSIS — I10 Essential (primary) hypertension: Secondary | ICD-10-CM | POA: Diagnosis present

## 2015-06-07 DIAGNOSIS — I5043 Acute on chronic combined systolic (congestive) and diastolic (congestive) heart failure: Secondary | ICD-10-CM

## 2015-06-07 DIAGNOSIS — Z9689 Presence of other specified functional implants: Secondary | ICD-10-CM

## 2015-06-07 DIAGNOSIS — E1165 Type 2 diabetes mellitus with hyperglycemia: Secondary | ICD-10-CM | POA: Diagnosis present

## 2015-06-07 DIAGNOSIS — I959 Hypotension, unspecified: Secondary | ICD-10-CM | POA: Diagnosis present

## 2015-06-07 LAB — I-STAT TROPONIN, ED: TROPONIN I, POC: 0.03 ng/mL (ref 0.00–0.08)

## 2015-06-07 LAB — CBC WITH DIFFERENTIAL/PLATELET
BASOS PCT: 1 %
Basophils Absolute: 0.1 10*3/uL (ref 0.0–0.1)
Eosinophils Absolute: 0.1 10*3/uL (ref 0.0–0.7)
Eosinophils Relative: 1 %
HEMATOCRIT: 30 % — AB (ref 39.0–52.0)
HEMOGLOBIN: 9.7 g/dL — AB (ref 13.0–17.0)
LYMPHS ABS: 0.9 10*3/uL (ref 0.7–4.0)
Lymphocytes Relative: 7 %
MCH: 30.3 pg (ref 26.0–34.0)
MCHC: 32.3 g/dL (ref 30.0–36.0)
MCV: 93.8 fL (ref 78.0–100.0)
MONOS PCT: 15 %
Monocytes Absolute: 2 10*3/uL — ABNORMAL HIGH (ref 0.1–1.0)
NEUTROS ABS: 9.9 10*3/uL — AB (ref 1.7–7.7)
NEUTROS PCT: 76 %
Platelets: 391 10*3/uL (ref 150–400)
RBC: 3.2 MIL/uL — AB (ref 4.22–5.81)
RDW: 18 % — ABNORMAL HIGH (ref 11.5–15.5)
WBC: 12.9 10*3/uL — ABNORMAL HIGH (ref 4.0–10.5)

## 2015-06-07 LAB — TROPONIN I: Troponin I: 0.03 ng/mL (ref <0.031)

## 2015-06-07 LAB — BASIC METABOLIC PANEL
ANION GAP: 14 (ref 5–15)
BUN: 16 mg/dL (ref 6–20)
CHLORIDE: 94 mmol/L — AB (ref 101–111)
CO2: 27 mmol/L (ref 22–32)
Calcium: 8.7 mg/dL — ABNORMAL LOW (ref 8.9–10.3)
Creatinine, Ser: 3.87 mg/dL — ABNORMAL HIGH (ref 0.61–1.24)
GFR calc non Af Amer: 14 mL/min — ABNORMAL LOW (ref >60)
GFR, EST AFRICAN AMERICAN: 16 mL/min — AB (ref >60)
Glucose, Bld: 181 mg/dL — ABNORMAL HIGH (ref 65–99)
Potassium: 3.6 mmol/L (ref 3.5–5.1)
Sodium: 135 mmol/L (ref 135–145)

## 2015-06-07 LAB — PROTIME-INR
INR: 2.34 — ABNORMAL HIGH (ref 0.00–1.49)
Prothrombin Time: 25.4 seconds — ABNORMAL HIGH (ref 11.6–15.2)

## 2015-06-07 LAB — GLUCOSE, CAPILLARY: Glucose-Capillary: 140 mg/dL — ABNORMAL HIGH (ref 65–99)

## 2015-06-07 MED ORDER — IPRATROPIUM-ALBUTEROL 0.5-2.5 (3) MG/3ML IN SOLN
3.0000 mL | RESPIRATORY_TRACT | Status: DC
  Administered 2015-06-07 – 2015-06-08 (×4): 3 mL via RESPIRATORY_TRACT
  Filled 2015-06-07 (×3): qty 3

## 2015-06-07 MED ORDER — SODIUM CHLORIDE 0.9 % IV SOLN
250.0000 mL | INTRAVENOUS | Status: DC | PRN
  Administered 2015-06-08 – 2015-06-09 (×2): 250 mL via INTRAVENOUS

## 2015-06-07 MED ORDER — ETOMIDATE 2 MG/ML IV SOLN
INTRAVENOUS | Status: AC | PRN
  Administered 2015-06-07: 20 mg via INTRAVENOUS

## 2015-06-07 MED ORDER — IPRATROPIUM-ALBUTEROL 0.5-2.5 (3) MG/3ML IN SOLN: 3.0000 mL | RESPIRATORY_TRACT | Status: DC | PRN

## 2015-06-07 MED ORDER — FENTANYL CITRATE (PF) 100 MCG/2ML IJ SOLN
50.0000 ug | INTRAMUSCULAR | Status: DC | PRN
  Administered 2015-06-08 – 2015-06-09 (×5): 50 ug via INTRAVENOUS
  Filled 2015-06-07 (×5): qty 2

## 2015-06-07 MED ORDER — SUCCINYLCHOLINE CHLORIDE 20 MG/ML IJ SOLN
100.0000 mg | Freq: Once | INTRAMUSCULAR | Status: DC
  Filled 2015-06-07: qty 5

## 2015-06-07 MED ORDER — ANTISEPTIC ORAL RINSE SOLUTION (CORINZ)
7.0000 mL | Freq: Four times a day (QID) | OROMUCOSAL | Status: DC
  Administered 2015-06-08 – 2015-06-09 (×7): 7 mL via OROMUCOSAL

## 2015-06-07 MED ORDER — CLONAZEPAM 0.5 MG PO TABS
1.0000 mg | ORAL_TABLET | Freq: Once | ORAL | Status: AC
  Administered 2015-06-07: 1 mg via ORAL
  Filled 2015-06-07: qty 2

## 2015-06-07 MED ORDER — ONDANSETRON HCL 4 MG/2ML IJ SOLN: 4.0000 mg | Freq: Four times a day (QID) | INTRAMUSCULAR | Status: DC | PRN

## 2015-06-07 MED ORDER — VANCOMYCIN HCL IN DEXTROSE 1-5 GM/200ML-% IV SOLN
1000.0000 mg | INTRAVENOUS | Status: DC | PRN
  Filled 2015-06-07: qty 200

## 2015-06-07 MED ORDER — HEPARIN SODIUM (PORCINE) 5000 UNIT/ML IJ SOLN: 5000.0000 [IU] | Freq: Three times a day (TID) | INTRAMUSCULAR | Status: DC

## 2015-06-07 MED ORDER — DEXMEDETOMIDINE HCL IN NACL 200 MCG/50ML IV SOLN
0.4000 ug/kg/h | INTRAVENOUS | Status: DC
  Administered 2015-06-07: 0.4 ug/kg/h via INTRAVENOUS
  Administered 2015-06-07: 0.7 ug/kg/h via INTRAVENOUS
  Administered 2015-06-08: 0.6 ug/kg/h via INTRAVENOUS
  Administered 2015-06-08: 0.3 ug/kg/h via INTRAVENOUS
  Administered 2015-06-08: 0.7 ug/kg/h via INTRAVENOUS
  Filled 2015-06-07 (×5): qty 50

## 2015-06-07 MED ORDER — ETOMIDATE 2 MG/ML IV SOLN: 20.0000 mg | Freq: Once | INTRAVENOUS | Status: DC

## 2015-06-07 MED ORDER — KETAMINE HCL 10 MG/ML IJ SOLN
1.0000 mg/kg | Freq: Once | INTRAMUSCULAR | Status: DC
  Filled 2015-06-07: qty 8.7

## 2015-06-07 MED ORDER — FENTANYL CITRATE (PF) 100 MCG/2ML IJ SOLN
100.0000 ug | Freq: Once | INTRAMUSCULAR | Status: AC
  Administered 2015-06-07: 100 ug via INTRAVENOUS

## 2015-06-07 MED ORDER — CEFEPIME HCL 1 G IJ SOLR
1.0000 g | Freq: Once | INTRAMUSCULAR | Status: AC
  Administered 2015-06-07: 1 g via INTRAVENOUS
  Filled 2015-06-07: qty 1

## 2015-06-07 MED ORDER — FAMOTIDINE IN NACL 20-0.9 MG/50ML-% IV SOLN
20.0000 mg | INTRAVENOUS | Status: DC
  Administered 2015-06-08 – 2015-06-09 (×3): 20 mg via INTRAVENOUS
  Filled 2015-06-07 (×4): qty 50

## 2015-06-07 MED ORDER — FENTANYL CITRATE (PF) 100 MCG/2ML IJ SOLN
50.0000 ug | INTRAMUSCULAR | Status: DC | PRN
  Administered 2015-06-07 (×2): 50 ug via INTRAVENOUS
  Filled 2015-06-07 (×3): qty 2

## 2015-06-07 MED ORDER — SUCCINYLCHOLINE CHLORIDE 20 MG/ML IJ SOLN
INTRAMUSCULAR | Status: AC | PRN
  Administered 2015-06-07: 100 mg via INTRAVENOUS

## 2015-06-07 MED ORDER — LIDOCAINE HCL (PF) 1 % IJ SOLN
20.0000 mL | Freq: Once | INTRAMUSCULAR | Status: AC
  Administered 2015-06-07: 20 mL via INTRADERMAL
  Filled 2015-06-07: qty 20

## 2015-06-07 MED ORDER — PROPOFOL 1000 MG/100ML IV EMUL
0.0000 ug/kg/min | INTRAVENOUS | Status: DC
  Administered 2015-06-07: 5 ug/kg/min via INTRAVENOUS
  Filled 2015-06-07: qty 100

## 2015-06-07 MED ORDER — WARFARIN - PHARMACIST DOSING INPATIENT: Freq: Every day | Status: DC

## 2015-06-07 MED ORDER — CHLORHEXIDINE GLUCONATE 0.12% ORAL RINSE (MEDLINE KIT)
15.0000 mL | Freq: Two times a day (BID) | OROMUCOSAL | Status: DC
  Administered 2015-06-07 – 2015-06-09 (×5): 15 mL via OROMUCOSAL

## 2015-06-07 MED ORDER — DEXTROSE 5 % IV SOLN
1.0000 g | INTRAVENOUS | Status: DC
  Administered 2015-06-08 – 2015-06-09 (×2): 1 g via INTRAVENOUS
  Filled 2015-06-07 (×3): qty 1

## 2015-06-07 MED ORDER — VANCOMYCIN HCL 10 G IV SOLR
2000.0000 mg | Freq: Once | INTRAVENOUS | Status: AC
  Administered 2015-06-07: 2000 mg via INTRAVENOUS
  Filled 2015-06-07: qty 2000

## 2015-06-07 MED ORDER — ACETAMINOPHEN 325 MG PO TABS
650.0000 mg | ORAL_TABLET | ORAL | Status: DC | PRN
  Administered 2015-07-02: 650 mg via ORAL
  Filled 2015-06-07: qty 2

## 2015-06-07 MED ORDER — METHYLPREDNISOLONE SODIUM SUCC 125 MG IJ SOLR
60.0000 mg | Freq: Four times a day (QID) | INTRAMUSCULAR | Status: DC
  Administered 2015-06-07 – 2015-06-08 (×2): 60 mg via INTRAVENOUS
  Filled 2015-06-07: qty 0.96
  Filled 2015-06-07 (×2): qty 2
  Filled 2015-06-07 (×4): qty 0.96

## 2015-06-07 NOTE — Progress Notes (Signed)
   06/07/15 2100  Clinical Encounter Type  Visited With Patient;Family;Patient and family together  Visit Type Initial;ED  Referral From Nurse  Consult/Referral To Chaplain  Spiritual Encounters  Spiritual Needs Emotional  CH requested to visit with pt spouse who was very emotional; [place] offered spiritual and emotional support. 9:14 PM Gwynn Burly

## 2015-06-07 NOTE — ED Notes (Signed)
Patient placed on monitor, continuous pulse oximetry and blood pressure cuff; RT maintaining airway with NRB on patient

## 2015-06-07 NOTE — H&P (Signed)
PULMONARY / CRITICAL CARE MEDICINE   Name: Terry [name] MRN: [id number] DOB: 09-10-1934    ADMISSION DATE:  06/07/2015 CONSULTATION DATE:  06/07/15  REFERRING MD:  Vanita Panda  CHIEF COMPLAINT:  Shortness of breath  HISTORY OF PRESENT ILLNESS:   80yo CM w/PMHx noted below presented to the ED with a 2 day history of SOB. History was obtained from the patients wife as he is unable to his own history. He has had progressively worsening SOB associated with cough. He was able to go to HD today but only tolerated 1 hour of his HD as his respiratory status continued to deteriorate and the nurses at the facility called EMS for transport to the ED. Wife states that he was expectorating yellow sputum.   PAST MEDICAL HISTORY :  He  has a past medical history of Stroke [facility]) (2012); High cholesterol; Hypertension; Kidney disease; COPD (chronic obstructive pulmonary disease) ([place]); CAD (coronary artery disease) (1999); Acute and chronic respiratory failure (10/22/2012); Osteoarthritis (10/22/2012); Pneumonia (April 2014); and Atrial fibrillation ([place]).  PAST SURGICAL HISTORY: He  has past surgical history that includes Coronary stent placement (1999); Appendectomy (1984); Colon surgery (2012); Hernia repair; Cystoscopy/retrograde/ureteroscopy (Bilateral, 04/23/2014); and AV fistula placement (Right, 05/21/2015).  Allergies  Allergen Reactions  . Yellow Dyes (Non-Tartrazine) Other (See Comments)    Burns arms Cough medications (tussionex and benzonatate ok)  . Levaquin [Levofloxacin In D5w] Itching    No current facility-administered medications on file prior to encounter.   Current Outpatient Prescriptions on File Prior to Encounter  Medication Sig  . warfarin (COUMADIN) 4 MG tablet Take 1 tablet (4 mg total) by mouth daily at 6 PM. Dose needs to be adjusted by your doctor  . albuterol (VENTOLIN HFA) 108 (90 BASE) MCG/ACT inhaler Inhale 2 puffs into the lungs every 4 (four) hours as needed for wheezing  or shortness of breath.  [name] amLODipine (NORVASC) 10 MG tablet Take 1 tablet (10 mg total) by mouth daily.  [name] atorvastatin (LIPITOR) 20 MG tablet Take 1 tablet (20 mg total) by mouth daily. (Patient taking differently: Take 20 mg by mouth daily at 6 PM. )  . benzonatate (TESSALON) 200 MG capsule Take 1 capsule (200 mg total) by mouth 3 (three) times daily as needed for cough.  . budesonide-formoterol (SYMBICORT) 160-4.5 MCG/ACT inhaler Inhale 2 puffs into the lungs 2 (two) times daily.  . clonazePAM (KLONOPIN) 1 MG tablet Take 1 tablet (1 mg total) by mouth daily as needed for anxiety (for HD treatment).  [name] guaiFENesin-dextromethorphan (ROBITUSSIN DM) 100-10 MG/5ML syrup Take 5 mLs by mouth every 4 (four) hours as needed for cough.  [name] HYDROcodone-acetaminophen (NORCO/VICODIN) 5-325 MG tablet Take 1 tablet by mouth every 6 (six) hours as needed for moderate pain or severe pain.  [name] ipratropium-albuterol (DUONEB) 0.5-2.5 (3) MG/3ML SOLN Take 3 mLs by nebulization 3 (three) times daily.  . magic mouthwash SOLN Take 5 mLs by mouth 4 (four) times daily. X 10days  . methocarbamol (ROBAXIN) 500 MG tablet Take 1 tablet (500 mg total) by mouth every 8 (eight) hours as needed for muscle spasms.  . metoprolol tartrate (LOPRESSOR) 25 MG tablet Take 1 tablet (25 mg total) by mouth 2 (two) times daily.  . midodrine (PROAMATINE) 10 MG tablet Take 1 tablet (10 mg total) by mouth 3 (three) times daily with meals.  . polyethylene glycol (MIRALAX) packet Take 17 g by mouth daily as needed for moderate constipation.  . senna-docusate (SENOKOT S) 8.6-50 MG tablet Take 2  tablets by mouth at bedtime as needed for mild constipation.  [name] tiotropium (SPIRIVA HANDIHALER) 18 MCG inhalation capsule INHALE THE CONTENTS OF ONE CAPSULE INTO LUNGS ONCE DAILY (Patient taking differently: Place 18 mcg into inhaler and inhale daily. )    FAMILY HISTORY:  His indicated that his mother is deceased. He indicated that his father is  deceased. He indicated that his sister is alive. He indicated that his brother is alive.   SOCIAL HISTORY: He  reports that he quit smoking about 15 years ago. His smoking use included Cigarettes, Pipe, and Cigars. He has a 49 pack-year smoking history. He has never used smokeless tobacco. He reports that he does not drink alcohol or use illicit drugs.  REVIEW OF SYSTEMS:   Unable to obtain as the patient is in respiratory distress  SUBJECTIVE:  Unable to obtain as the patient is in respiratory distress   VITAL SIGNS: BP 147/48 mmHg  Pulse 100  Temp(Src) 99 F (37.2 C) (Oral)  Resp 31  SpO2 98%  HEMODYNAMICS:    VENTILATOR SETTINGS: Vent Mode:  [-] BIPAP FiO2 (%):  [100 %] 100 %  INTAKE / OUTPUT:    PHYSICAL EXAMINATION: General:  80yo CM who appears his stated age and is in acute respiratory distress. He is arousable and oriented. He is well developed and well nourished.  Neuro:  Cranial nerves II-XII grossly intact. PERRL. EOMI. no focal deficits. Gait was not assessed due to critical illness.  HEENT: Head, normocephalic, atraumatic. Ears symmetric, permeable. Eyes: no conjunctival icterus, no erythema. Nose: permeable, midline septum, unable to assess oropharynx as the patient is currently on BiPAP Cardiovascular: S1S2 irregularly irregular. Tachycardic. 3+ bipedal edema noted.  Lungs:  Decreased lung sounds at bases. Green/yellow sputum visualized. No crackles, wheezing, nor rhonchi noted.  The patient has labored breathing.  Abdomen:  Soft, nontender, no guarding, nondistended. Present bowel sounds. Musculoskeletal:  No muscle atrophy noted nor weakness. ROM intact. Gait was not assessed due to critical illness. No swelling nor tenderness of the joints.  Skin:  No notable scars, rashes, cruises. No bed sores noted. Perm cath in right upper chest.  Lymphatics: no palpable lymphadenopathy of cervical, supraclvicular nor inguinal areas.    LABS:  BMET  Recent Labs Lab  06/02/15 0427 06/03/15 1515 06/07/15 1515  NA 132* 125* 135  K 4.0 4.5 3.6  CL 92* 86* 94*  CO2 27 24 27   BUN 38* 55* 16  CREATININE 7.06* 8.99* 3.87*  GLUCOSE 93 141* 181*    Electrolytes  Recent Labs Lab 06/02/15 0427 06/03/15 1515 06/07/15 1515  CALCIUM 8.9 8.6* 8.7*  PHOS 4.4 5.0*  --     CBC  Recent Labs Lab 06/03/15 0634 06/07/15 1515  WBC 6.5 12.9*  HGB 8.1* 9.7*  HCT 24.7* 30.0*  PLT 238 391    Coag's  Recent Labs Lab 06/03/15 0634 06/04/15 0718 06/07/15 1515  INR 2.12* 2.39* 2.34*    Sepsis Markers No results for input(s): LATICACIDVEN, PROCALCITON, O2SATVEN in the last 168 hours.  ABG No results for input(s): PHART, PCO2ART, PO2ART in the last 168 hours.  Liver Enzymes  Recent Labs Lab 06/02/15 0427 06/03/15 1515  ALBUMIN 3.4* 3.2*    Cardiac Enzymes  Recent Labs Lab 06/07/15 1515  TROPONINI 0.03    Glucose  Recent Labs Lab 06/03/15 0758 06/03/15 1228 06/03/15 1849 06/03/15 2152 06/04/15 0816 06/04/15 1202  GLUCAP 92 112* 95 170* 106* 102*    Imaging Dg Chest Port 1 [address]  06/07/2015  CLINICAL DATA:  Shortness of breath. EXAM: PORTABLE CHEST 1 VIEW COMPARISON:  06/01/2015 FINDINGS: There is a right IJ catheter with tip seen in the cavoatrial junction. Heart size is normal. Atelectasis is present in both lung bases. There are airspace opacities identified within both lower lobes. When compared with the previous exam the aeration to the right lower lobe is unchanged. There is worsening aeration the left lower lobe. IMPRESSION: 1. Bilateral lower lobe airspace opacities. 2. Bibasilar atelectasis. Electronically Signed   By: Kerby Moors M.D.   On: 06/07/2015 16:01     STUDIES:  CXR as noted above  CULTURES: Drawn in ED  ANTIBIOTICS: vanc and cefepime  SIGNIFICANT EVENTS: Pt arrived for SOB. Intubated in ED  LINES/TUBES: Perm cath in left upper chest  DISCUSSION: 80yo CM presented to ED, assessed for SOB.  Intubated in ED for worsening respiratory distress and increased oxygen requirements. Treated for differential which includes HCAP, viral illness, COPD exacerbation, and heart failure exacerbation amongst others.   ASSESSMENT / PLAN:  PULMONARY A: Acute hypoxemic respiratory failure 2/2 HCAP vs COPD exacerbation vs. CHF exacerbation COPD  P:  Vent management. Titrate PEEP, FiO2, RR and TV. Provide duonebs, steroids. HD in the AM  CARDIOVASCULAR A: Hypertension, Coronary artery disease, atrial fibrillation on warfarin  Possible Acute on Chronic Diastolic heart failure, documented on Echo 02/03/13 P: hold antihypertensives at this time Will trend troponins, f/u BNP Will hold warfarin, his INR is therapeutic    RENAL A:  ESRD on HD P:  nephro consult in the AM  GASTROINTESTINAL A: No active issues  P:     HEMATOLOGIC A:  Anemia of chronic disease leukocytosis P: monitor   INFECTIOUS A:  Sepsis 2/2 HCAP P:  Vancomycin, s/p load in ED, dosing after HD. Cefepime 1gm IV q24 hours F/u blood cultures and respiratory quant. Perform Flu assessment  ENDOCRINE A:  No active issues P:     NEUROLOGIC A:  Hx of CVA, sedation for mechanical ventilation P:  Sedation with PRN fentanyl and propofol, will titrate to RASS goal: 0    FAMILY  - Updates: spouse at bedside, discussed care with her.   - Inter-disciplinary family meet or Palliative Care meeting due by:  day 7  Critical care time: 55 minutes spent managing ventilator, sedation, discussions with patient and family, interdisciplinary discussions. This excludes procedures.   Randa Lynn, MD Critical Care Medicine [place] Pager: [phone]  06/07/2015, 7:25 PM

## 2015-06-07 NOTE — Telephone Encounter (Signed)
Called pt's wife, answered "this is Thayer Headings", I stated who I was and the line was ended.  Wcb.

## 2015-06-07 NOTE — Telephone Encounter (Signed)
Terry [name] called back, states she lost service on her phone. Please return call.

## 2015-06-07 NOTE — Progress Notes (Signed)
Placed patient on BiPAP per MD.

## 2015-06-07 NOTE — Progress Notes (Addendum)
ANTIBIOTIC CONSULT NOTE - INITIAL  Pharmacy Consult for vancomycin Indication: pneumonia  Allergies  Allergen Reactions  . Yellow Dyes (Non-Tartrazine) Other (See Comments)    Burns arms Cough medications (tussionex and benzonatate ok)  . Levaquin [Levofloxacin In D5w] Itching    Patient Measurements:   Adjusted Body Weight:   Vital Signs: Temp: 99 F (37.2 C) (12/16 1520) Temp Source: Oral (12/16 1520) BP: 171/67 mmHg (12/16 1519) Pulse Rate: 100 (12/16 1519) Intake/Output from previous day:   Intake/Output from this shift:    Labs:  Recent Labs  06/07/15 1515  WBC 12.9*  HGB 9.7*  PLT 391  CREATININE 3.87*   Estimated Creatinine Clearance: 16.9 mL/min (by C-G formula based on Cr of 3.87). No results for input(s): VANCOTROUGH, VANCOPEAK, VANCORANDOM, GENTTROUGH, GENTPEAK, GENTRANDOM, TOBRATROUGH, TOBRAPEAK, TOBRARND, AMIKACINPEAK, AMIKACINTROU, AMIKACIN in the last 72 hours.   Microbiology: Recent Results (from the past 720 hour(s))  MRSA PCR Screening     Status: Abnormal   Collection Time: 05/14/15  7:18 AM  Result Value Ref Range Status   MRSA by PCR POSITIVE (A) NEGATIVE Final    Comment:        The GeneXpert MRSA Assay (FDA approved for NASAL specimens only), is one component of a comprehensive MRSA colonization surveillance program. It is not intended to diagnose MRSA infection nor to guide or monitor treatment for MRSA infections. RESULT CALLED TO, READ BACK BY AND VERIFIED WITH: H. MILLS RN 9:55 05/14/15 ([email or web address])   Surgical pcr screen     Status: Abnormal   Collection Time: 05/21/15  5:09 AM  Result Value Ref Range Status   MRSA, PCR POSITIVE (A) NEGATIVE Final   Staphylococcus aureus POSITIVE (A) NEGATIVE Final    Comment:        The Xpert SA Assay (FDA approved for NASAL specimens in patients over 38 years of age), is one component of a comprehensive surveillance program.  Test performance has been validated by [facility] for  patients greater than or equal to 52 year old. It is not intended to diagnose infection nor to guide or monitor treatment.   Culture, Urine     Status: None   Collection Time: 06/02/15  2:59 PM  Result Value Ref Range Status   Specimen Description URINE, CATHETERIZED  Final   Special Requests zosyn and vancomycin  Final   Culture NO GROWTH 1 DAY  Final   Report Status 06/03/2015 FINAL  Final    Medical History: Past Medical History  Diagnosis Date  . Stroke ([place]) 2012  . High cholesterol   . Hypertension   . Kidney disease     CKD4, sees [place] [facility], considering dialysis  . COPD (chronic obstructive pulmonary disease) ([place])   . CAD (coronary artery disease) [place], [name]  . Acute and chronic respiratory failure 10/22/2012  . Osteoarthritis 10/22/2012  . Pneumonia April 2014  . Atrial fibrillation (HCC)     Assessment: 80 yom with ESRD-HD MWF from HD with SOB. New HD (only 2nd treatment) - received 2.5h today prior to coming in. No abx given in HD per patient/wife. Pharmacy consulted to dose vancomycin for PNA. Cefepime ordered x 1 dose per MD in the ED. WBC 12.9 on admit, afeb.  Pharmacy also consulted to dose warfarin for afib. INR therapeutic 2.34 on admit. Hg low 9.7 on admit, plt wnl. No bleeding documented. Patient already took warfarin today. Will hold tonight.  PTA warfarin dose: 4mg  daily (last dose pta 06/07/15)  Goal of Therapy:  Target pre-HD level 15 - 25 mcg/mL Target post-HD level 5 - 15 mcg/mL INR 2-3 Mon CBC  Plan:  Vanc 2g IV x 1 dose Cefepime 1g IV q24h per MD Monitor clinical progress, c/s, renal function, abx plan/LOT Vanc levels as indicated F/u HD schedule inpatient for abx maintenance dosing  No further warfarin tonight (pt already took) Daily INR Mon s/sx bleeding  Elicia Lamp, PharmD, BCPS Clinical Pharmacist Pager [phone] 06/07/2015 5:04 PM

## 2015-06-07 NOTE — H&P (Addendum)
[facility] History and Physical  Terry [name] WSF:[id number] DOB: Jun 30, 1934 DOA: 06/07/2015   PCP: [name], DO  Specialists: Followed by nephrology as an outpatient  Chief Complaint: Shortness of breath while at dialysis  HPI: Terry [name] is a 80 y.o. male with a past medical history of end-stage renal disease recently started on hemodialysis, atrial fibrillation on anticoagulation, COPD, diastolic congestive heart failure, history of coronary artery disease who was discharged on December 13, after he was admitted for acute on chronic renal failure. He was deemed to be end-stage renal disease and was started on dialysis. After discharge, patient was feeling well, but he did have a cough. The cough got worse over the last 2 days. He has been coughing up yellowish expectoration. Denies any fever per se. His breathing has become more labored. He went for his dialysis on Wednesday, which he tolerated well. And then he was going for his dialysis today when he felt more short of breath. During dialysis because patient became acutely short of breath and was noted to have low oxygen saturations. Patient was subsequently transported over to the emergency department. He complains of some right-sided chest pain. No nausea, vomiting. No difficulty with swallowing or choking spell with swallowing. No dizziness or lightheadedness. After getting breathing treatments in the emergency department he is feeling better. He was found to have a temperature of 38F, he was tachycardic, his sats were in the 70s when he presented to the hospital. Chest x-ray revealed bibasilar opacities. He has slightly elevated WBC. Presentation is highly suggestive of pneumonia. Hospitalist was called for admission. Patient still requiring 100% nonrebreather, although it could be titrated down.  Home Medications: Prior to Admission medications   Medication Sig Start Date End Date Taking? Authorizing Provider  albuterol  (VENTOLIN HFA) 108 (90 BASE) MCG/ACT inhaler Inhale 2 puffs into the lungs every 4 (four) hours as needed for wheezing or shortness of breath. 05/09/15   [name], DO  amLODipine (NORVASC) 10 MG tablet Take 1 tablet (10 mg total) by mouth daily. 06/06/15   Burnell Blanks, MD  atorvastatin (LIPITOR) 20 MG tablet Take 1 tablet (20 mg total) by mouth daily. Patient taking differently: Take 20 mg by mouth daily at 6 PM.  03/25/15   Burnell Blanks, MD  benzonatate (TESSALON) 200 MG capsule Take 1 capsule (200 mg total) by mouth 3 (three) times daily as needed for cough. 06/04/15   Ripudeep Krystal Eaton, MD  budesonide-formoterol (SYMBICORT) 160-4.5 MCG/ACT inhaler Inhale 2 puffs into the lungs 2 (two) times daily. 11/07/14   Tanda Rockers, MD  clonazePAM (KLONOPIN) 1 MG tablet Take 1 tablet (1 mg total) by mouth daily as needed for anxiety (for HD treatment). 06/04/15   Ripudeep Krystal Eaton, MD  guaiFENesin-dextromethorphan (ROBITUSSIN DM) 100-10 MG/5ML syrup Take 5 mLs by mouth every 4 (four) hours as needed for cough. 06/04/15   Ripudeep Krystal Eaton, MD  HYDROcodone-acetaminophen (NORCO/VICODIN) 5-325 MG tablet Take 1 tablet by mouth every 6 (six) hours as needed for moderate pain or severe pain. 06/04/15   Ripudeep Krystal Eaton, MD  ipratropium-albuterol (DUONEB) 0.5-2.5 (3) MG/3ML SOLN Take 3 mLs by nebulization 3 (three) times daily. 06/04/15   Ripudeep Krystal Eaton, MD  magic mouthwash SOLN Take 5 mLs by mouth 4 (four) times daily. X 10days 06/04/15   Ripudeep Krystal Eaton, MD  methocarbamol (ROBAXIN) 500 MG tablet Take 1 tablet (500 mg total) by mouth every 8 (eight) hours as needed  for muscle spasms. 06/04/15   Ripudeep Krystal Eaton, MD  metoprolol tartrate (LOPRESSOR) 25 MG tablet Take 1 tablet (25 mg total) by mouth 2 (two) times daily. 06/04/15   Ripudeep Krystal Eaton, MD  midodrine (PROAMATINE) 10 MG tablet Take 1 tablet (10 mg total) by mouth 3 (three) times daily with meals. 06/04/15   Ripudeep Krystal Eaton, MD  polyethylene glycol  [facility]) packet Take 17 g by mouth daily as needed for moderate constipation. 06/04/15   Ripudeep Krystal Eaton, MD  senna-docusate (SENOKOT S) 8.6-50 MG tablet Take 2 tablets by mouth at bedtime as needed for mild constipation. 06/04/15   Ripudeep Krystal Eaton, MD  tiotropium (SPIRIVA HANDIHALER) 18 MCG inhalation capsule INHALE THE CONTENTS OF ONE CAPSULE INTO LUNGS ONCE DAILY Patient taking differently: Place 18 mcg into inhaler and inhale daily.  04/25/15   Tanda Rockers, MD  warfarin (COUMADIN) 4 MG tablet Take 1 tablet (4 mg total) by mouth daily at 6 PM. Dose needs to be adjusted by your doctor 06/04/15   Ripudeep Krystal Eaton, MD    Allergies:  Allergies  Allergen Reactions  . Yellow Dyes (Non-Tartrazine) Other (See Comments)    Burns arms Cough medications (tussionex and benzonatate ok)  . Levaquin [Levofloxacin In D5w] Itching    Past Medical History: Past Medical History  Diagnosis Date  . Stroke ([place]) 2012  . High cholesterol   . Hypertension   . Kidney disease     CKD4, sees [place] [facility], considering dialysis  . COPD (chronic obstructive pulmonary disease) ([place])   . CAD (coronary artery disease) [place], [name]  . Acute and chronic respiratory failure 10/22/2012  . Osteoarthritis 10/22/2012  . Pneumonia April 2014  . Atrial fibrillation [facility])     Past Surgical History  Procedure Laterality Date  . Coronary stent placement  1999    2 stents  . Appendectomy  1984  . Colon surgery  2012    partial colon removed - twisted bowel  . Hernia repair      Right inguinal  . Cystoscopy/retrograde/ureteroscopy Bilateral 04/23/2014    Procedure: CYSTOSCOPY BILATERAL RETROGRADE,  LEFT URETEROSCOPY, LEFT STENT PLACEMENT;  Surgeon: Raynelle Bring, MD;  Location: [facility] ORS;  Service: Urology;  Laterality: Bilateral;  . Av fistula placement Right 05/21/2015    Procedure: Right Arm Brachiocephalic ARTERIOVENOUS (AV) FISTULA CREATION;  Surgeon: Angelia Mould, MD;  Location: [facility] OR;  Service:  Vascular;  Laterality: Right;    Social History: He lives with his wife. Quit smoking in 2001. No alcohol use. Has limited mobility due to his multiple medical problems.  Family History:  Family History  Problem Relation Age of Onset  . Cancer Mother     Breast  . Heart disease Father   . Heart attack Father   . Parkinson's disease Brother      Review of Systems - unable to do due to his acute respiratory failure  Physical Examination  Filed Vitals:   06/07/15 1515 06/07/15 1519 06/07/15 1520  BP: 171/67 171/67   Pulse: 103 100   Temp:   99 F (37.2 C)  TempSrc:   Oral  Resp: 28 24   SpO2: 97% 98%     BP 171/67 mmHg  Pulse 100  Temp(Src) 99 F (37.2 C) (Oral)  Resp 24  SpO2 98%  General appearance: alert, cooperative, appears stated age and mild distress Head: Normocephalic, without obvious abnormality, atraumatic Eyes: conjunctivae/corneas clear. PERRL, EOM's intact.  Throat: lips,  mucosa, and tongue normal; teeth and gums normal Neck: no adenopathy, no carotid bruit, no JVD, supple, symmetrical, trachea midline and thyroid not enlarged, symmetric, no tenderness/mass/nodules Resp: Using accessory muscles. Tachypneic. Rhonchi and some wheezing with crackles in the left base. Few crackles in the right base as well. Cardio: S1, S2 is irregularly irregular. No S3, S4. No rubs, murmurs, or bruit. 1-2+ pitting edema bilateral lower extremities. GI: soft, non-tender; bowel sounds normal; no masses,  no organomegaly Extremities: 1-2+ pitting edema bilateral lower extremities Pulses: Difficult to palpate due to swelling in the lower extremities. Good radial pulses. Skin: Chronic skin changes lower extremity Lymph nodes: Cervical, supraclavicular, and axillary nodes normal. Neurologic: Alert and oriented 3. Cranial nerves II-12 intact. Motor strength equal bilateral upper and lower extremities.  Laboratory Data: Results for orders placed or performed during the hospital  encounter of 06/07/15 (from the past 48 hour(s))  Basic metabolic panel     Status: Abnormal   Collection Time: 06/07/15  3:15 PM  Result Value Ref Range   Sodium 135 135 - 145 mmol/L   Potassium 3.6 3.5 - 5.1 mmol/L   Chloride 94 (L) 101 - 111 mmol/L   CO2 27 22 - 32 mmol/L   Glucose, Bld 181 (H) 65 - 99 mg/dL   BUN 16 6 - 20 mg/dL   Creatinine, Ser 3.87 (H) 0.61 - 1.24 mg/dL   Calcium 8.7 (L) 8.9 - 10.3 mg/dL   GFR calc non Af Amer 14 (L) >60 mL/min   GFR calc Af Amer 16 (L) >60 mL/min    Comment: (NOTE) The eGFR has been calculated using the CKD EPI equation. This calculation has not been validated in all clinical situations. eGFR's persistently <60 mL/min signify possible Chronic Kidney Disease.    Anion gap 14 5 - 15  CBC with Differential     Status: Abnormal   Collection Time: 06/07/15  3:15 PM  Result Value Ref Range   WBC 12.9 (H) 4.0 - 10.5 K/uL   RBC 3.20 (L) 4.22 - 5.81 MIL/uL   Hemoglobin 9.7 (L) 13.0 - 17.0 g/dL   HCT 30.0 (L) 39.0 - 52.0 %   MCV 93.8 78.0 - 100.0 fL   MCH 30.3 26.0 - 34.0 pg   MCHC 32.3 30.0 - 36.0 g/dL   RDW 18.0 (H) 11.5 - 15.5 %   Platelets 391 150 - 400 K/uL   Neutrophils Relative % 76 %   Neutro Abs 9.9 (H) 1.7 - 7.7 K/uL   Lymphocytes Relative 7 %   Lymphs Abs 0.9 0.7 - 4.0 K/uL   Monocytes Relative 15 %   Monocytes Absolute 2.0 (H) 0.1 - 1.0 K/uL   Eosinophils Relative 1 %   Eosinophils Absolute 0.1 0.0 - 0.7 K/uL   Basophils Relative 1 %   Basophils Absolute 0.1 0.0 - 0.1 K/uL  Protime-INR     Status: Abnormal   Collection Time: 06/07/15  3:15 PM  Result Value Ref Range   Prothrombin Time 25.4 (H) 11.6 - 15.2 seconds   INR 2.34 (H) 0.00 - 1.49  Troponin I     Status: None   Collection Time: 06/07/15  3:15 PM  Result Value Ref Range   Troponin I 0.03 <0.031 ng/mL    Comment:        NO INDICATION OF MYOCARDIAL INJURY.   I-stat troponin, ED     Status: None   Collection Time: 06/07/15  3:37 PM  Result Value Ref Range  Troponin i, poc 0.03 0.00 - 0.08 ng/mL   Comment 3            Comment: Due to the release kinetics of cTnI, a negative result within the first hours of the onset of symptoms does not rule out myocardial infarction with certainty. If myocardial infarction is still suspected, repeat the test at appropriate intervals.     Radiology Reports: Dg Chest Port 1 View  06/07/2015  CLINICAL DATA:  Shortness of breath. EXAM: PORTABLE CHEST 1 VIEW COMPARISON:  06/01/2015 FINDINGS: There is a right IJ catheter with tip seen in the cavoatrial junction. Heart size is normal. Atelectasis is present in both lung bases. There are airspace opacities identified within both lower lobes. When compared with the previous exam the aeration to the right lower lobe is unchanged. There is worsening aeration the left lower lobe. IMPRESSION: 1. Bilateral lower lobe airspace opacities. 2. Bibasilar atelectasis. Electronically Signed   By: Kerby Moors M.D.   On: 06/07/2015 16:01    My interpretation of Electrocardiogram: Poor quality EKG shows atrial fibrillation at the 105 bpm. Normal axis. Intervals appear to be normal. No obvious ischemic changes.  Problem List  Principal Problem:   HCAP (healthcare-associated pneumonia) Active Problems:   COPD GOLD III   CAD (coronary artery disease) of artery bypass graft   Essential hypertension   Acute respiratory failure with hypoxia (HCC)   ESRD (end stage renal disease) ([place])   Chronic atrial fibrillation ([place])   Assessment: This is a 80 year old Caucasian male with a past medical history as stated earlier, who presents with the shortness of breath from his dialysis center. He has been coughing up yellowish sputum the last few days. He has slightly elevated WBC. He was tachycardic and hypoxic on presentation. Chest x-ray is not suggestive of fluid overload. He is therapeutic on warfarin and so thromboembolism is also less likely. His presentation is more consistent with  the pneumonia. His vital signs and presentation also suggested sepsis.  Plan: #1 healthcare associate pneumonia with sepsis: Patient will be admitted to the stepdown unit. Sepsis protocol will be initiated. Check lactic acid and pro-calcitonin. Treat with vancomycin and cefepime. Blood cultures have been obtained. Interestingly, he completed 12 days of IV antibiotics during his recent hospitalization. The left-sided infiltrate seen on today's chest x-ray was not seen on chest x-ray from few days ago.  #2 acute hypoxic respiratory failure: Patient has a chronic respiratory failure and is on oxygen by nasal cannula. However, he is requiring nonrebreather here. He's feeling better and I think oxygen can be titrated down. His sats were in the 70s when he presented to the hospital.  #3 End-stage renal disease on hemodialysis: This is new diagnosis for him. Nephrology has been notified. It doesn't appear that he needs urgent dialysis tonight. They will evaluate him tomorrow.  #4 history of chronic atrial fibrillation on anticoagulation: Heart rate is reasonably well controlled. Warfarin per pharmacy. Continue metoprolol. Monitor on telemetry.  #5 history of COPD Gold 3: No evidence for acute exacerbation at this time. He does have some rhonchi on the left, but that appears to be primarily due to pneumonia. Continue with his inhalers. The nebulizer treatments as on a scheduled basis.  #6 history of chronic diastolic CHF: Volume being managed with hemodialysis. Currently, the he does have lower extremity edema. But no pulmonary edema noted on chest x-ray or by exam.  #7 History of hypotension with the known history of essential hypertension: Hypotension mainly due  to dialysis. Continue midodrine. He was started back on his amlodipine by his cardiologist. We will hold off on it for tonight. This can be readdressed tomorrow when he is a bit more stable.   #8 History of CAD: He has a history of RCA stenosis  treated with a stent in 1999. Currently stable. Troponin is normal. Right-sided chest pain, most likely due to his pneumonia.  #9 Normocytic anemia: Most likely anemia of chronic disease. Continue to monitor.  ADDENDUM Patient now requiring BiPAP. Heart rate has gone up to the 120s. We will consult critical care medicine at this point in time. He may need ICU admission.  DVT Prophylaxis: On warfarin Code Status: Full code Family Communication: Discussed with the patient and wife  Disposition Plan: Admit to stepdown   Further management decisions will depend on results of further testing and patient's response to treatment.   [facility]  [facility] Pager [phone]  If 7PM-7AM, please contact night-coverage [email or web address] Password [email or web address]  06/07/2015, 5:51 PM

## 2015-06-07 NOTE — ED Notes (Signed)
Timeout performed for emergent intubation by Sherian Maroon, MD. Correct patient identified by name and DOB.

## 2015-06-07 NOTE — Telephone Encounter (Signed)
Called to speak with spouse. She reports to d/c message as pt is being sent over to [facility] ED from dialysis. FYI to MW

## 2015-06-07 NOTE — ED Notes (Signed)
Pt here from dialysis with c/o sob , pt is new to dialysis only his second treatment pt received 2.5 hrs today, pt was unable to tolerate cpap from ems

## 2015-06-07 NOTE — ED Notes (Signed)
Tension pneumothorax decompressed with 14g needle by Sherian Maroon, MD

## 2015-06-07 NOTE — ED Notes (Signed)
Attempted to place pt on 4 liters n/c pt became anxious sats dropped to 83% pt placed back on NRB sats up to 95%

## 2015-06-07 NOTE — ED Notes (Signed)
Pt placed on bipap due to work of breathing and low sats , pt responded to treatment admitting md updated , CCM coming to look at pt

## 2015-06-07 NOTE — ED Provider Notes (Signed)
CSN: [id number]     Arrival date & time 06/07/15  1513 History   None    Chief Complaint  Patient presents with  . Respiratory Distress     80 year old male with history of COPD, CHF, ESRD recently started on HD who presents today from HD clinic with increasing shortness of breath. Patient says that throughout the day he's had increasing shortness of breath with cough. No fevers chills nausea vomiting constipation. Does endorse some substernal chest pain which was relieved when EMS gave nitroglycerin. EMS also tried to give CPAP but patient refused and he could not tolerate it.   Patient is a 80 y.o. male presenting with shortness of breath.  Shortness of Breath Severity:  Severe Onset quality:  Gradual Timing:  Constant Progression:  Worsening Chronicity:  Recurrent Relieved by:  Oxygen Associated symptoms: chest pain (substernal, aching, now improved) and cough   Associated symptoms: no abdominal pain, no vomiting and no wheezing     Past Medical History  Diagnosis Date  . Stroke ([place]) 2012  . High cholesterol   . Hypertension   . Kidney disease     CKD4, sees [place] [facility], considering dialysis  . COPD (chronic obstructive pulmonary disease) ([place])   . CAD (coronary artery disease) [place], [name]  . Acute and chronic respiratory failure 10/22/2012  . Osteoarthritis 10/22/2012  . Pneumonia April 2014  . Atrial fibrillation [facility])    Past Surgical History  Procedure Laterality Date  . Coronary stent placement  1999    2 stents  . Appendectomy  1984  . Colon surgery  2012    partial colon removed - twisted bowel  . Hernia repair      Right inguinal  . Cystoscopy/retrograde/ureteroscopy Bilateral 04/23/2014    Procedure: CYSTOSCOPY BILATERAL RETROGRADE,  LEFT URETEROSCOPY, LEFT STENT PLACEMENT;  Surgeon: Raynelle Bring, MD;  Location: [facility] ORS;  Service: Urology;  Laterality: Bilateral;  . Av fistula placement Right 05/21/2015    Procedure: Right Arm Brachiocephalic  ARTERIOVENOUS (AV) FISTULA CREATION;  Surgeon: Angelia Mould, MD;  Location: [facility] OR;  Service: Vascular;  Laterality: Right;   Family History  Problem Relation Age of Onset  . Cancer Mother     Breast  . Heart disease Father   . Heart attack Father   . Parkinson's disease Brother    Social History  Substance Use Topics  . Smoking status: Former Smoker -- 1.00 packs/day for 49 years    Types: Cigarettes, Pipe, Cigars    Quit date: 02/21/2000  . Smokeless tobacco: Never Used  . Alcohol Use: No    Review of Systems  Respiratory: Positive for cough and shortness of breath. Negative for wheezing.   Cardiovascular: Positive for chest pain (substernal, aching, now improved).  Gastrointestinal: Negative for nausea, vomiting, abdominal pain and diarrhea.  Neurological: Negative for weakness and light-headedness.  All other systems reviewed and are negative.     Allergies  Yellow dyes (non-tartrazine) and Levaquin  Home Medications   Prior to Admission medications   Medication Sig Start Date End Date Taking? Authorizing Provider  albuterol (VENTOLIN HFA) 108 (90 BASE) MCG/ACT inhaler Inhale 2 puffs into the lungs every 4 (four) hours as needed for wheezing or shortness of breath. 05/09/15  Yes [name], DO  amLODipine (NORVASC) 10 MG tablet Take 1 tablet (10 mg total) by mouth daily. 06/06/15  Yes Burnell Blanks, MD  atorvastatin (LIPITOR) 20 MG tablet Take 1 tablet (20 mg total)  by mouth daily. Patient taking differently: Take 20 mg by mouth daily at 6 PM.  03/25/15  Yes Burnell Blanks, MD  benzonatate (TESSALON) 200 MG capsule Take 1 capsule (200 mg total) by mouth 3 (three) times daily as needed for cough. 06/04/15  Yes Ripudeep Krystal Eaton, MD  budesonide-formoterol (SYMBICORT) 160-4.5 MCG/ACT inhaler Inhale 2 puffs into the lungs 2 (two) times daily. 11/07/14  Yes Tanda Rockers, MD  clonazePAM (KLONOPIN) 1 MG tablet Take 1 tablet (1 mg total) by mouth daily as  needed for anxiety (for HD treatment). 06/04/15  Yes Ripudeep Krystal Eaton, MD  guaiFENesin-dextromethorphan (ROBITUSSIN DM) 100-10 MG/5ML syrup Take 5 mLs by mouth every 4 (four) hours as needed for cough. 06/04/15  Yes Ripudeep Krystal Eaton, MD  HYDROcodone-acetaminophen (NORCO/VICODIN) 5-325 MG tablet Take 1 tablet by mouth every 6 (six) hours as needed for moderate pain or severe pain. 06/04/15  Yes Ripudeep K Rai, MD  ipratropium-albuterol (DUONEB) 0.5-2.5 (3) MG/3ML SOLN Take 3 mLs by nebulization 3 (three) times daily. 06/04/15  Yes Ripudeep Krystal Eaton, MD  magic mouthwash SOLN Take 5 mLs by mouth 4 (four) times daily. X 10days 06/04/15  Yes Ripudeep Krystal Eaton, MD  methocarbamol (ROBAXIN) 500 MG tablet Take 1 tablet (500 mg total) by mouth every 8 (eight) hours as needed for muscle spasms. 06/04/15  Yes Ripudeep Krystal Eaton, MD  metoprolol tartrate (LOPRESSOR) 25 MG tablet Take 1 tablet (25 mg total) by mouth 2 (two) times daily. 06/04/15  Yes Ripudeep Krystal Eaton, MD  midodrine (PROAMATINE) 10 MG tablet Take 1 tablet (10 mg total) by mouth 3 (three) times daily with meals. 06/04/15  Yes Ripudeep Krystal Eaton, MD  polyethylene glycol (MIRALAX) packet Take 17 g by mouth daily as needed for moderate constipation. 06/04/15  Yes Ripudeep Krystal Eaton, MD  senna-docusate (SENOKOT S) 8.6-50 MG tablet Take 2 tablets by mouth at bedtime as needed for mild constipation. 06/04/15  Yes Ripudeep K Rai, MD  tiotropium (SPIRIVA HANDIHALER) 18 MCG inhalation capsule INHALE THE CONTENTS OF ONE CAPSULE INTO LUNGS ONCE DAILY Patient taking differently: Place 18 mcg into inhaler and inhale daily.  04/25/15  Yes Tanda Rockers, MD  warfarin (COUMADIN) 4 MG tablet Take 1 tablet (4 mg total) by mouth daily at 6 PM. Dose needs to be adjusted by your doctor 06/04/15  Yes Ripudeep K Rai, MD   BP 137/60 mmHg  Pulse 76  Temp(Src) 98.7 F (37.1 C) (Oral)  Resp 16  Ht 5\' 9"  (1.753 m)  Wt 88.2 kg  BMI 28.70 kg/m2  SpO2 100% Physical Exam  Constitutional: He is  oriented to person, place, and time. He appears well-developed and well-nourished. No distress.  HENT:  Head: Normocephalic and atraumatic.  Eyes: Pupils are equal, round, and reactive to light.  Neck: Normal range of motion.  Cardiovascular:  No murmur heard. tachycardic  Pulmonary/Chest: He is in respiratory distress. He has no wheezes. He exhibits no tenderness.  On NRB, now more comfortable. Poor air movment.   Abdominal: He exhibits no distension. There is no tenderness. There is no guarding.  Musculoskeletal: He exhibits edema (LE edema, 2+). He exhibits no tenderness.  Neurological: He is alert and oriented to person, place, and time. No cranial nerve deficit.  Skin: He is not diaphoretic.    ED Course  .Critical Care Performed by: Sherian Maroon Authorized by: Carmin Muskrat Total critical care time: 45 minutes Critical care time was exclusive of separately billable procedures and treating  other patients. Critical care was necessary to treat or prevent imminent or life-threatening deterioration of the following conditions: respiratory failure and shock. Critical care was time spent personally by me on the following activities: ventilator management, re-evaluation of patient's condition, ordering and performing treatments and interventions, discussions with consultants, ordering and review of laboratory studies and evaluation of patient's response to treatment. Subsequent provider of critical care: I assumed direction of critical care for this patient from another provider of my specialty.  Needle decompression Date/Time: 06/08/2015 12:20 AM Performed by: Sherian Maroon Authorized by: Carmin Muskrat Consent: The procedure was performed in an emergent situation. Time out: Immediately prior to procedure a "time out" was called to verify the correct patient, procedure, equipment, support staff and site/side marked as required. Patient sedated: no Patient tolerance: Patient  tolerated the procedure well with no immediate complications  CHEST TUBE INSERTION Date/Time: 06/08/2015 12:21 AM Performed by: Sherian Maroon Authorized by: Carmin Muskrat Consent: The procedure was performed in an emergent situation. Verbal consent obtained. Risks and benefits: risks, benefits and alternatives were discussed Consent given by: spouse Indications: pneumothorax and tension pneumothorax Anesthesia: local infiltration Local anesthetic: lidocaine 1% without epinephrine Scalpel size: 11 Tube size: 32 [place] Dissection instrument: finger and Kelly clamp Tension pneumothorax heard: yes Tube connected to: suction Suture material: 0 silk Dressing: 4x4 sterile gauze and petrolatum-impregnated gauze Post-insertion x-ray findings: tube repositioned Patient tolerance: Patient tolerated the procedure well with no immediate complications   (including critical care time) Labs Review Labs Reviewed  BASIC METABOLIC PANEL - Abnormal; Notable for the following:    Chloride 94 (*)    Glucose, Bld 181 (*)    Creatinine, Ser 3.87 (*)    Calcium 8.7 (*)    GFR calc non Af Amer 14 (*)    GFR calc Af Amer 16 (*)    All other components within normal limits  CBC WITH DIFFERENTIAL/PLATELET - Abnormal; Notable for the following:    WBC 12.9 (*)    RBC 3.20 (*)    Hemoglobin 9.7 (*)    HCT 30.0 (*)    RDW 18.0 (*)    Neutro Abs 9.9 (*)    Monocytes Absolute 2.0 (*)    All other components within normal limits  PROTIME-INR - Abnormal; Notable for the following:    Prothrombin Time 25.4 (*)    INR 2.34 (*)    All other components within normal limits  GLUCOSE, CAPILLARY - Abnormal; Notable for the following:    Glucose-Capillary 140 (*)    All other components within normal limits  POCT I-STAT 3, ART BLOOD GAS (G3+) - Abnormal; Notable for the following:    pCO2 arterial 46.9 (*)    pO2, Arterial 195.0 (*)    Bicarbonate 26.7 (*)    All other components within normal limits   CULTURE, BLOOD (ROUTINE X 2)  CULTURE, BLOOD (ROUTINE X 2)  CULTURE, RESPIRATORY (NON-EXPECTORATED)  TROPONIN I  PROTIME-INR  BLOOD GAS, ARTERIAL  CBC  BASIC METABOLIC PANEL  MAGNESIUM  PHOSPHORUS  BLOOD GAS, ARTERIAL  TRIGLYCERIDES  BRAIN NATRIURETIC PEPTIDE  TROPONIN I  TROPONIN I  BLOOD GAS, ARTERIAL  I-STAT TROPOININ, ED  I-STAT TROPOININ, ED    Imaging Review Dg Chest Port 1 View  06/07/2015  CLINICAL DATA:  Chest tube readjustment EXAM: PORTABLE CHEST 1 VIEW COMPARISON:  Portable exam 2224 hours compared to 1217 hours FINDINGS: Tip of endotracheal tube projects 1.5 cm above carina. Nasogastric tube extends into stomach with proximal side-port likely  above gastroesophageal junction. RIGHT jugular central venous catheter with tip projecting over RIGHT atrium. LEFT thoracostomy tube present. Enlargement of cardiac silhouette. Mediastinal contours and pulmonary vascularity normal. Atherosclerotic calcification aorta. Bibasilar atelectasis. No infiltrate, pleural effusion, or pneumothorax. IMPRESSION: Bibasilar atelectasis. Line and tube positions as above ; recommend advancing nasogastric tube 5 cm to position proximal side-port within stomach. No pneumothorax. Electronically Signed   By: Lavonia Dana M.D.   On: 06/07/2015 23:01   Dg Chest Port 1 View  06/07/2015  CLINICAL DATA:  Chest tube placement EXAM: PORTABLE CHEST 1 VIEW COMPARISON:  06/07/2015 FINDINGS: Endotracheal tube with tip measuring 4.4 cm above the carina. Enteric tube tip is off the field of view but below the left hemidiaphragm. Left central venous catheter with tip over the cavoatrial junction. Interval left chest tube placement. Previous left chest tube remains. Minimal residual left pneumothorax in the left costophrenic angle region. Atelectasis in both lung bases. Can't exclude a small amount of pneumomediastinum on the left although this could be due to residual pneumothorax. IMPRESSION: Appliances appear in  satisfactory position. Small residual left pneumothorax. Gas along the left mediastinal border could represent pneumo mediastinum or residual pneumothorax. Atelectasis in the lung bases. Electronically Signed   By: Lucienne Capers M.D.   On: 06/07/2015 23:00   Dg Chest Portable 1 View  06/07/2015  CLINICAL DATA:  Post chest decompression. EXAM: PORTABLE CHEST 1 VIEW COMPARISON:  06/07/2015 FINDINGS: Interval placement of a left chest tube. There is evacuation of most of the previous tension left pneumothorax. There is a small residual pneumothorax in the left base. The lung has mostly re- expanded with mild atelectasis in the base. Linear atelectasis demonstrated in the right lung base. Endotracheal tube with tip measuring 4.2 cm above the carina. Right central venous catheter with tip over the cavoatrial junction. IMPRESSION: Interval evacuation of left pneumothorax with re-expansion of the left lung. Minimal residual pneumothorax in the left base and mild atelectasis in both lung bases. Electronically Signed   By: Lucienne Capers M.D.   On: 06/07/2015 21:44   Dg Chest Portable 1 View  06/07/2015  CLINICAL DATA:  Initial evaluation for endotracheal tube placement. EXAM: PORTABLE CHEST 1 VIEW COMPARISON:  Prior radiograph performed earlier on the same day. FINDINGS: There has been interval placement of an endotracheal tube with the tip approximately 2.3 cm above the carina. There is suggestion of possible early right root mediastinal shift. Cardiac silhouette is stable. Right IJ approach dual lumen hemodialysis catheter is stable. New large left pneumothorax with essentially complete collapse of the left lung medially. Suggestion of possible early right word mediastinal shift, suggesting tension component. No right-sided pneumothorax. Basilar right airspace opacities again seen, stable. IMPRESSION: 1. New large left pneumothorax with near complete collapse of the left lung. There is suggestion of early  rightward mediastinal shift, concerning for tension component. 2. Tip of endotracheal tube approximately 2.3 cm above the carina. 3. Stable right basilar airspace opacity, which may reflect atelectasis or infiltrate. Critical Value/emergent results were called by telephone at the time of interpretation on 06/07/2015 at 9:25 pm to Dr. Carmin Muskrat, who verbally acknowledged these results. Electronically Signed   By: Jeannine Boga M.D.   On: 06/07/2015 21:30   Dg Chest Port 1 View  06/07/2015  CLINICAL DATA:  Shortness of breath. EXAM: PORTABLE CHEST 1 VIEW COMPARISON:  06/01/2015 FINDINGS: There is a right IJ catheter with tip seen in the cavoatrial junction. Heart size is normal. Atelectasis is present  in both lung bases. There are airspace opacities identified within both lower lobes. When compared with the previous exam the aeration to the right lower lobe is unchanged. There is worsening aeration the left lower lobe. IMPRESSION: 1. Bilateral lower lobe airspace opacities. 2. Bibasilar atelectasis. Electronically Signed   By: Kerby Moors M.D.   On: 06/07/2015 16:01   I have personally reviewed and evaluated these images and lab results as part of my medical decision-making.   EKG Interpretation   Date/Time:  Friday June 07 2015 15:15:38 EST Ventricular Rate:  105 PR Interval:    QRS Duration: 101 QT Interval:  359 QTC Calculation: 474 R Axis:   71 Text Interpretation:  Atrial fibrillation Ventricular premature complex  Low voltage, precordial leads Minimal ST depression, anterior leads  Baseline wander in lead(s) I aVR aVL Atrial fibrillation Premature  ventricular complexes ST-t wave abnormality Abnormal ekg Confirmed by  Carmin Muskrat  MD [phone]) on 06/07/2015 3:51:07 PM      ED ECG REPORT   Date: 06/07/2015  Rate: 105  Rhythm: atrial fibrillation  QRS Axis: lower voltage in anterior leads  Intervals: normal  ST/T Wave abnormalities: nonspecific ST changes   Conduction Disutrbances:none  Narrative Interpretation:   Old EKG Reviewed: lower voltage in anterior leads compared to prior  I have personally reviewed the EKG tracing and agree with the computerized printout as noted.   MDM   Final diagnoses:  HCAP (healthcare-associated pneumonia)  SOB (shortness of breath)  Oxygen desaturation  Chest tube in place    80 yo M w/new ESRD who just started HD presetns w/SOB from dialysis clinic. Resp distress w/crackles per EMS. Given nitro and tried cpap but pt couldn't tolerate. Arrived in mild distress. On NRB, sating 99%. LE edema, no JVD. DDx includes volume OL 2/2 ESRD vs COPD exacerbation vs CAD or CHF exacerbation. Labs and imaging ordered.  CXR w/questionable PNA. No sign of volume overload. Labs grossly wnl. Treating for HCAP w/vanc and cefepime. W/new O2 requirement, will admit to hospitalist for further treatment.   5:52 PM Attempted to wean off NRB and placed on [place], but pt desated, taking a long time to recover. Tachypneic to 40s, sating 83%. Discussed w/pt need for either intubation or trial of BPAP as pt refused bpap earlier. Pt now amenable to trial of bpap.   On bpap, pt sating 100%. However, work of breathing still high. Escalating level of care.   7:47 PM Intubated as above for work of breathing.   Pt now hypotensive. CXR revealed tension pneumo. Needle decompression followed by left chest tube.   Chest tube repositioned and Admitted to ICU.     Pt was seen under the supervision of Dr. Vanita Panda. Labs and imaging reviewed by myself.    Sherian Maroon, MD 06/08/15 Benancio Deeds  Carmin Muskrat, MD 06/08/15 2019

## 2015-06-07 NOTE — Code Documentation (Signed)
Pt intubated with an 8.0 ET. Positive color change, equal bilateral chest rise and fall. Breath sounds heard in bilateral lungs. 26cm at the lip.

## 2015-06-08 ENCOUNTER — Inpatient Hospital Stay (HOSPITAL_COMMUNITY): Payer: Medicare PPO

## 2015-06-08 DIAGNOSIS — I25810 Atherosclerosis of coronary artery bypass graft(s) without angina pectoris: Secondary | ICD-10-CM

## 2015-06-08 DIAGNOSIS — J189 Pneumonia, unspecified organism: Secondary | ICD-10-CM

## 2015-06-08 DIAGNOSIS — J449 Chronic obstructive pulmonary disease, unspecified: Secondary | ICD-10-CM

## 2015-06-08 DIAGNOSIS — J9601 Acute respiratory failure with hypoxia: Secondary | ICD-10-CM

## 2015-06-08 DIAGNOSIS — N186 End stage renal disease: Secondary | ICD-10-CM

## 2015-06-08 DIAGNOSIS — Z789 Other specified health status: Secondary | ICD-10-CM

## 2015-06-08 DIAGNOSIS — Z9689 Presence of other specified functional implants: Secondary | ICD-10-CM | POA: Diagnosis present

## 2015-06-08 LAB — PHOSPHORUS
PHOSPHORUS: 5.4 mg/dL — AB (ref 2.5–4.6)
PHOSPHORUS: 5.3 mg/dL — AB (ref 2.5–4.6)
PHOSPHORUS: 4 mg/dL (ref 2.5–4.6)

## 2015-06-08 LAB — TROPONIN I
TROPONIN I: 0.08 ng/mL — AB (ref <0.031)
TROPONIN I: 0.08 ng/mL — AB (ref <0.031)
TROPONIN I: 0.08 ng/mL — AB (ref <0.031)
Troponin I: 0.08 ng/mL — ABNORMAL HIGH (ref <0.031)

## 2015-06-08 LAB — GLUCOSE, CAPILLARY
GLUCOSE-CAPILLARY: 211 mg/dL — AB (ref 65–99)
Glucose-Capillary: 184 mg/dL — ABNORMAL HIGH (ref 65–99)
Glucose-Capillary: 131 mg/dL — ABNORMAL HIGH (ref 65–99)
Glucose-Capillary: 123 mg/dL — ABNORMAL HIGH (ref 65–99)

## 2015-06-08 LAB — COMPREHENSIVE METABOLIC PANEL
ALT: 13 U/L — AB (ref 17–63)
AST: 27 U/L (ref 15–41)
Albumin: 2.9 g/dL — ABNORMAL LOW (ref 3.5–5.0)
Alkaline Phosphatase: 56 U/L (ref 38–126)
Anion gap: 13 (ref 5–15)
BUN: 24 mg/dL — ABNORMAL HIGH (ref 6–20)
CALCIUM: 7.9 mg/dL — AB (ref 8.9–10.3)
CHLORIDE: 101 mmol/L (ref 101–111)
CO2: 22 mmol/L (ref 22–32)
CREATININE: 5.21 mg/dL — AB (ref 0.61–1.24)
GFR, EST AFRICAN AMERICAN: 11 mL/min — AB (ref >60)
GFR, EST NON AFRICAN AMERICAN: 10 mL/min — AB (ref >60)
Glucose, Bld: 179 mg/dL — ABNORMAL HIGH (ref 65–99)
Potassium: 4.5 mmol/L (ref 3.5–5.1)
Sodium: 136 mmol/L (ref 135–145)
TOTAL PROTEIN: 6 g/dL — AB (ref 6.5–8.1)
Total Bilirubin: 0.8 mg/dL (ref 0.3–1.2)

## 2015-06-08 LAB — TRIGLYCERIDES: Triglycerides: 91 mg/dL (ref <150)

## 2015-06-08 LAB — PROTIME-INR
INR: 2.1 — ABNORMAL HIGH (ref 0.00–1.49)
Prothrombin Time: 23.4 seconds — ABNORMAL HIGH (ref 11.6–15.2)

## 2015-06-08 LAB — CBC
HCT: 26.2 % — ABNORMAL LOW (ref 39.0–52.0)
Hemoglobin: 8.1 g/dL — ABNORMAL LOW (ref 13.0–17.0)
MCH: 30.2 pg (ref 26.0–34.0)
MCHC: 30.9 g/dL (ref 30.0–36.0)
MCV: 97.8 fL (ref 78.0–100.0)
PLATELETS: 272 10*3/uL (ref 150–400)
RBC: 2.68 MIL/uL — AB (ref 4.22–5.81)
RDW: 18.5 % — ABNORMAL HIGH (ref 11.5–15.5)
WBC: 11.1 10*3/uL — AB (ref 4.0–10.5)

## 2015-06-08 LAB — POCT I-STAT 3, ART BLOOD GAS (G3+)
ACID-BASE EXCESS: 1 mmol/L (ref 0.0–2.0)
Bicarbonate: 26.7 mEq/L — ABNORMAL HIGH (ref 20.0–24.0)
O2 Saturation: 100 %
PH ART: 7.365 (ref 7.350–7.450)
TCO2: 28 mmol/L (ref 0–100)
pCO2 arterial: 46.9 mmHg — ABNORMAL HIGH (ref 35.0–45.0)
pO2, Arterial: 195 mmHg — ABNORMAL HIGH (ref 80.0–100.0)

## 2015-06-08 LAB — HEPARIN LEVEL (UNFRACTIONATED): HEPARIN UNFRACTIONATED: 0.5 [IU]/mL (ref 0.30–0.70)

## 2015-06-08 LAB — MAGNESIUM
MAGNESIUM: 2 mg/dL (ref 1.7–2.4)
MAGNESIUM: 1.9 mg/dL (ref 1.7–2.4)
Magnesium: 2 mg/dL (ref 1.7–2.4)

## 2015-06-08 LAB — LACTIC ACID, PLASMA
Lactic Acid, Venous: 1.4 mmol/L (ref 0.5–2.0)
Lactic Acid, Venous: 1.6 mmol/L (ref 0.5–2.0)

## 2015-06-08 LAB — STREP PNEUMONIAE URINARY ANTIGEN: STREP PNEUMO URINARY ANTIGEN: NEGATIVE

## 2015-06-08 LAB — INFLUENZA PANEL BY PCR (TYPE A & B)
H1N1FLUPCR: NOT DETECTED
INFLBPCR: NEGATIVE
Influenza A By PCR: NEGATIVE

## 2015-06-08 LAB — PROCALCITONIN: Procalcitonin: 1.79 ng/mL

## 2015-06-08 LAB — BRAIN NATRIURETIC PEPTIDE: B Natriuretic Peptide: 892.3 pg/mL — ABNORMAL HIGH (ref 0.0–100.0)

## 2015-06-08 LAB — HIV ANTIBODY (ROUTINE TESTING W REFLEX): HIV SCREEN 4TH GENERATION: NONREACTIVE

## 2015-06-08 MED ORDER — PRO-STAT SUGAR FREE PO LIQD
30.0000 mL | Freq: Three times a day (TID) | ORAL | Status: DC
  Administered 2015-06-08 – 2015-06-09 (×3): 30 mL
  Filled 2015-06-08 (×5): qty 30

## 2015-06-08 MED ORDER — VITAL HIGH PROTEIN PO LIQD
1000.0000 mL | ORAL | Status: DC
  Administered 2015-06-08: 1000 mL
  Filled 2015-06-08 (×2): qty 1000

## 2015-06-08 MED ORDER — METHOCARBAMOL 500 MG PO TABS
500.0000 mg | ORAL_TABLET | Freq: Three times a day (TID) | ORAL | Status: DC | PRN
Start: 2015-06-08 — End: 2015-07-01
  Administered 2015-06-09 – 2015-07-01 (×14): 500 mg via ORAL
  Filled 2015-06-08 (×20): qty 1

## 2015-06-08 MED ORDER — GUAIFENESIN ER 600 MG PO TB12
600.0000 mg | ORAL_TABLET | Freq: Two times a day (BID) | ORAL | Status: DC
Start: 2015-06-08 — End: 2015-06-08
  Filled 2015-06-08 (×3): qty 1

## 2015-06-08 MED ORDER — SODIUM CHLORIDE 0.9 % IV SOLN: 100.0000 mL | INTRAVENOUS | Status: DC | PRN

## 2015-06-08 MED ORDER — SENNOSIDES-DOCUSATE SODIUM 8.6-50 MG PO TABS
2.0000 | ORAL_TABLET | Freq: Every evening | ORAL | Status: DC | PRN
  Filled 2015-06-08: qty 2

## 2015-06-08 MED ORDER — POLYETHYLENE GLYCOL 3350 17 G PO PACK
17.0000 g | PACK | Freq: Every day | ORAL | Status: DC | PRN
  Filled 2015-06-08: qty 1

## 2015-06-08 MED ORDER — SODIUM CHLORIDE 0.9 % IV SOLN
125.0000 mg | Freq: Once | INTRAVENOUS | Status: AC
  Administered 2015-06-08: 125 mg via INTRAVENOUS
  Filled 2015-06-08 (×3): qty 10

## 2015-06-08 MED ORDER — SODIUM CHLORIDE 0.9 % IJ SOLN
3.0000 mL | Freq: Two times a day (BID) | INTRAMUSCULAR | Status: DC
  Administered 2015-06-08 – 2015-07-03 (×38): 3 mL via INTRAVENOUS

## 2015-06-08 MED ORDER — LIDOCAINE HCL (PF) 1 % IJ SOLN: 5.0000 mL | INTRAMUSCULAR | Status: DC | PRN

## 2015-06-08 MED ORDER — SODIUM CHLORIDE 0.9 % IV SOLN
125.0000 mg | INTRAVENOUS | Status: DC
  Administered 2015-06-10 – 2015-06-12 (×2): 125 mg via INTRAVENOUS
  Filled 2015-06-08 (×5): qty 10

## 2015-06-08 MED ORDER — DARBEPOETIN ALFA 60 MCG/0.3ML IJ SOSY: 60.0000 ug | PREFILLED_SYRINGE | INTRAMUSCULAR | Status: DC

## 2015-06-08 MED ORDER — METOPROLOL TARTRATE 25 MG PO TABS
25.0000 mg | ORAL_TABLET | Freq: Two times a day (BID) | ORAL | Status: DC
  Administered 2015-06-08 – 2015-06-10 (×5): 25 mg via ORAL
  Filled 2015-06-08 (×7): qty 1

## 2015-06-08 MED ORDER — PANTOPRAZOLE SODIUM 40 MG IV SOLR
40.0000 mg | INTRAVENOUS | Status: DC
  Administered 2015-06-08 – 2015-06-09 (×2): 40 mg via INTRAVENOUS
  Filled 2015-06-08 (×3): qty 40

## 2015-06-08 MED ORDER — ONDANSETRON HCL 4 MG PO TABS: 4.0000 mg | ORAL_TABLET | Freq: Four times a day (QID) | ORAL | Status: DC | PRN

## 2015-06-08 MED ORDER — VANCOMYCIN HCL IN DEXTROSE 1-5 GM/200ML-% IV SOLN
1000.0000 mg | Freq: Once | INTRAVENOUS | Status: AC
  Administered 2015-06-08: 1000 mg via INTRAVENOUS
  Filled 2015-06-08: qty 200

## 2015-06-08 MED ORDER — CLONAZEPAM 1 MG PO TABS
1.0000 mg | ORAL_TABLET | Freq: Every day | ORAL | Status: DC | PRN
  Administered 2015-06-08 – 2015-07-01 (×12): 1 mg via ORAL
  Filled 2015-06-08 (×2): qty 1
  Filled 2015-06-08 (×2): qty 2
  Filled 2015-06-08 (×8): qty 1
  Filled 2015-06-08 (×3): qty 2

## 2015-06-08 MED ORDER — SODIUM CHLORIDE 0.9 % IV BOLUS (SEPSIS): 1000.0000 mL | INTRAVENOUS | Status: AC

## 2015-06-08 MED ORDER — SODIUM CHLORIDE 0.9 % IV SOLN
250.0000 mL | INTRAVENOUS | Status: DC | PRN
  Administered 2015-06-10: 250 mL via INTRAVENOUS

## 2015-06-08 MED ORDER — LEVALBUTEROL HCL 0.63 MG/3ML IN NEBU: 0.6300 mg | INHALATION_SOLUTION | Freq: Four times a day (QID) | RESPIRATORY_TRACT | Status: DC | PRN

## 2015-06-08 MED ORDER — HEPARIN SODIUM (PORCINE) 1000 UNIT/ML DIALYSIS: 1000.0000 [IU] | INTRAMUSCULAR | Status: DC | PRN

## 2015-06-08 MED ORDER — ACETAMINOPHEN 325 MG PO TABS: 650.0000 mg | ORAL_TABLET | Freq: Four times a day (QID) | ORAL | Status: DC | PRN

## 2015-06-08 MED ORDER — MIDODRINE HCL 5 MG PO TABS
10.0000 mg | ORAL_TABLET | Freq: Three times a day (TID) | ORAL | Status: DC
Start: 2015-06-08 — End: 2015-06-08
  Administered 2015-06-08: 10 mg via ORAL
  Filled 2015-06-08 (×4): qty 2

## 2015-06-08 MED ORDER — SODIUM CHLORIDE 0.9 % IJ SOLN
3.0000 mL | Freq: Two times a day (BID) | INTRAMUSCULAR | Status: DC
  Administered 2015-06-08 – 2015-06-16 (×14): 3 mL via INTRAVENOUS

## 2015-06-08 MED ORDER — TIOTROPIUM BROMIDE MONOHYDRATE 18 MCG IN CAPS
18.0000 ug | ORAL_CAPSULE | Freq: Every day | RESPIRATORY_TRACT | Status: DC
  Filled 2015-06-08: qty 5

## 2015-06-08 MED ORDER — PENTAFLUOROPROP-TETRAFLUOROETH EX AERO: 1.0000 "application " | INHALATION_SPRAY | CUTANEOUS | Status: DC | PRN

## 2015-06-08 MED ORDER — ALTEPLASE 2 MG IJ SOLR: 2.0000 mg | Freq: Once | INTRAMUSCULAR | Status: DC | PRN

## 2015-06-08 MED ORDER — PRO-STAT SUGAR FREE PO LIQD
30.0000 mL | Freq: Two times a day (BID) | ORAL | Status: DC
  Administered 2015-06-08: 30 mL
  Filled 2015-06-08 (×2): qty 30

## 2015-06-08 MED ORDER — INSULIN ASPART 100 UNIT/ML ~~LOC~~ SOLN
0.0000 [IU] | SUBCUTANEOUS | Status: DC
  Administered 2015-06-08: 2 [IU] via SUBCUTANEOUS
  Administered 2015-06-08: 3 [IU] via SUBCUTANEOUS
  Administered 2015-06-08: 2 [IU] via SUBCUTANEOUS
  Administered 2015-06-09 (×5): 3 [IU] via SUBCUTANEOUS
  Administered 2015-06-09 – 2015-06-10 (×5): 2 [IU] via SUBCUTANEOUS
  Administered 2015-06-10: 3 [IU] via SUBCUTANEOUS
  Administered 2015-06-11 (×2): 2 [IU] via SUBCUTANEOUS
  Administered 2015-06-11: 3 [IU] via SUBCUTANEOUS
  Administered 2015-06-12: 2 [IU] via SUBCUTANEOUS
  Administered 2015-06-12: 3 [IU] via SUBCUTANEOUS
  Administered 2015-06-12 (×3): 2 [IU] via SUBCUTANEOUS
  Administered 2015-06-12: 3 [IU] via SUBCUTANEOUS
  Administered 2015-06-13: 2 [IU] via SUBCUTANEOUS
  Administered 2015-06-13: 3 [IU] via SUBCUTANEOUS
  Administered 2015-06-13 (×2): 2 [IU] via SUBCUTANEOUS
  Administered 2015-06-14 (×2): 3 [IU] via SUBCUTANEOUS
  Administered 2015-06-14 (×2): 2 [IU] via SUBCUTANEOUS
  Administered 2015-06-14: 3 [IU] via SUBCUTANEOUS
  Administered 2015-06-14: 2 [IU] via SUBCUTANEOUS
  Administered 2015-06-15: 3 [IU] via SUBCUTANEOUS
  Administered 2015-06-15 (×2): 2 [IU] via SUBCUTANEOUS
  Administered 2015-06-15 (×2): 3 [IU] via SUBCUTANEOUS
  Administered 2015-06-16: 2 [IU] via SUBCUTANEOUS
  Administered 2015-06-16: 5 [IU] via SUBCUTANEOUS
  Administered 2015-06-16 (×2): 2 [IU] via SUBCUTANEOUS

## 2015-06-08 MED ORDER — ONDANSETRON HCL 4 MG/2ML IJ SOLN: 4.0000 mg | Freq: Four times a day (QID) | INTRAMUSCULAR | Status: DC | PRN

## 2015-06-08 MED ORDER — HYDROCODONE-ACETAMINOPHEN 5-325 MG PO TABS: 1.0000 | ORAL_TABLET | Freq: Four times a day (QID) | ORAL | Status: DC | PRN

## 2015-06-08 MED ORDER — VANCOMYCIN HCL IN DEXTROSE 1-5 GM/200ML-% IV SOLN
1000.0000 mg | INTRAVENOUS | Status: AC
  Administered 2015-06-08: 1000 mg via INTRAVENOUS
  Filled 2015-06-08: qty 200

## 2015-06-08 MED ORDER — BUDESONIDE-FORMOTEROL FUMARATE 160-4.5 MCG/ACT IN AERO
2.0000 | INHALATION_SPRAY | Freq: Two times a day (BID) | RESPIRATORY_TRACT | Status: DC
  Filled 2015-06-08: qty 6

## 2015-06-08 MED ORDER — IPRATROPIUM-ALBUTEROL 0.5-2.5 (3) MG/3ML IN SOLN
3.0000 mL | Freq: Four times a day (QID) | RESPIRATORY_TRACT | Status: DC
  Administered 2015-06-08 – 2015-06-16 (×32): 3 mL via RESPIRATORY_TRACT
  Filled 2015-06-08 (×33): qty 3

## 2015-06-08 MED ORDER — NEPRO/CARBSTEADY PO LIQD
1000.0000 mL | ORAL | Status: DC
  Administered 2015-06-08: 1000 mL
  Filled 2015-06-08 (×3): qty 1000

## 2015-06-08 MED ORDER — LEVALBUTEROL HCL 0.63 MG/3ML IN NEBU: 0.6300 mg | INHALATION_SOLUTION | Freq: Four times a day (QID) | RESPIRATORY_TRACT | Status: DC

## 2015-06-08 MED ORDER — SODIUM CHLORIDE 0.9 % IJ SOLN: 3.0000 mL | INTRAMUSCULAR | Status: DC | PRN

## 2015-06-08 MED ORDER — ATORVASTATIN CALCIUM 20 MG PO TABS
20.0000 mg | ORAL_TABLET | Freq: Every day | ORAL | Status: DC
  Administered 2015-06-08 – 2015-07-04 (×23): 20 mg via ORAL
  Filled 2015-06-08 (×24): qty 1

## 2015-06-08 MED ORDER — DARBEPOETIN ALFA 60 MCG/0.3ML IJ SOSY
60.0000 ug | PREFILLED_SYRINGE | Freq: Once | INTRAMUSCULAR | Status: AC
  Administered 2015-06-08: 60 ug via INTRAVENOUS
  Filled 2015-06-08: qty 0.3

## 2015-06-08 MED ORDER — HEPARIN SODIUM (PORCINE) 1000 UNIT/ML DIALYSIS: 2000.0000 [IU] | INTRAMUSCULAR | Status: DC | PRN

## 2015-06-08 MED ORDER — ACETAMINOPHEN 650 MG RE SUPP: 650.0000 mg | Freq: Four times a day (QID) | RECTAL | Status: DC | PRN

## 2015-06-08 MED ORDER — LIDOCAINE-PRILOCAINE 2.5-2.5 % EX CREA: 1.0000 "application " | TOPICAL_CREAM | CUTANEOUS | Status: DC | PRN

## 2015-06-08 MED ORDER — METHYLPREDNISOLONE SODIUM SUCC 125 MG IJ SOLR
60.0000 mg | Freq: Two times a day (BID) | INTRAMUSCULAR | Status: DC
  Administered 2015-06-08 – 2015-06-10 (×4): 60 mg via INTRAVENOUS
  Filled 2015-06-08 (×2): qty 0.96
  Filled 2015-06-08: qty 2
  Filled 2015-06-08 (×2): qty 0.96

## 2015-06-08 MED ORDER — HEPARIN (PORCINE) IN NACL 100-0.45 UNIT/ML-% IJ SOLN
1100.0000 [IU]/h | INTRAMUSCULAR | Status: DC
  Administered 2015-06-08: 1100 [IU]/h via INTRAVENOUS
  Filled 2015-06-08 (×3): qty 250

## 2015-06-08 MED ORDER — SODIUM CHLORIDE 0.9 % IV BOLUS (SEPSIS): 500.0000 mL | INTRAVENOUS | Status: AC

## 2015-06-08 NOTE — Progress Notes (Signed)
ANTIBIOTIC CONSULT NOTE   Pharmacy Consult for vancomycin Indication: pneumonia  Allergies  Allergen Reactions  . Yellow Dyes (Non-Tartrazine) Other (See Comments)    Burns arms Cough medications (tussionex and benzonatate ok)  . Levaquin [Levofloxacin In D5w] Itching    Patient Measurements: Height: 5\' 9"  (175.3 cm) Weight: 196 lb 13.9 oz (89.3 kg) IBW/kg (Calculated) : 70.7 Adjusted Body Weight:   Vital Signs: Temp: 97.9 F (36.6 C) (12/17 1611) Temp Source: Oral (12/17 1611) BP: 139/63 mmHg (12/17 1715) Pulse Rate: 100 (12/17 1715) Intake/Output from previous day: 12/16 0701 - 12/17 0700 In: 1234.2 [I.V.:1184.2; IV Piggyback:50] Out: 62 [Urine:22; Chest Tube:40] Intake/Output from this shift: Total I/O In: 158.8 [I.V.:123.8; NG/GT:35] Out: -   Labs:  Recent Labs  06/07/15 1515 06/08/15 0307  WBC 12.9* 11.1*  HGB 9.7* 8.1*  PLT 391 272  CREATININE 3.87* 5.21*   Estimated Creatinine Clearance: 12.9 mL/min (by C-G formula based on Cr of 5.21). No results for input(s): VANCOTROUGH, VANCOPEAK, VANCORANDOM, GENTTROUGH, GENTPEAK, GENTRANDOM, TOBRATROUGH, TOBRAPEAK, TOBRARND, AMIKACINPEAK, AMIKACINTROU, AMIKACIN in the last 72 hours.   Microbiology: Recent Results (from the past 720 hour(s))  MRSA PCR Screening     Status: Abnormal   Collection Time: 05/14/15  7:18 AM  Result Value Ref Range Status   MRSA by PCR POSITIVE (A) NEGATIVE Final    Comment:        The GeneXpert MRSA Assay (FDA approved for NASAL specimens only), is one component of a comprehensive MRSA colonization surveillance program. It is not intended to diagnose MRSA infection nor to guide or monitor treatment for MRSA infections. RESULT CALLED TO, READ BACK BY AND VERIFIED WITH: H. MILLS RN 9:55 05/14/15 ([email or web address])   Surgical pcr screen     Status: Abnormal   Collection Time: 05/21/15  5:09 AM  Result Value Ref Range Status   MRSA, PCR POSITIVE (A) NEGATIVE Final   Staphylococcus  aureus POSITIVE (A) NEGATIVE Final    Comment:        The Xpert SA Assay (FDA approved for NASAL specimens in patients over 76 years of age), is one component of a comprehensive surveillance program.  Test performance has been validated by [facility] for patients greater than or equal to 90 year old. It is not intended to diagnose infection nor to guide or monitor treatment.   Culture, Urine     Status: None   Collection Time: 06/02/15  2:59 PM  Result Value Ref Range Status   Specimen Description URINE, CATHETERIZED  Final   Special Requests zosyn and vancomycin  Final   Culture NO GROWTH 1 DAY  Final   Report Status 06/03/2015 FINAL  Final  Culture, blood (routine x 2)     Status: None (Preliminary result)   Collection Time: 06/07/15  9:29 PM  Result Value Ref Range Status   Specimen Description BLOOD LEFT FOREARM  Final   Special Requests BOTTLES DRAWN AEROBIC ONLY 6CCS  Final   Culture NO GROWTH < 24 HOURS  Final   Report Status PENDING  Incomplete  Culture, blood (routine x 2)     Status: None (Preliminary result)   Collection Time: 06/07/15  9:38 PM  Result Value Ref Range Status   Specimen Description BLOOD LEFT HAND  Final   Special Requests BOTTLES DRAWN AEROBIC ONLY 4CCS  Final   Culture NO GROWTH < 24 HOURS  Final   Report Status PENDING  Incomplete    Medical History: Past Medical History  Diagnosis Date  .  Stroke ([place]) 2012  . High cholesterol   . Hypertension   . Kidney disease     CKD4, sees [place] [facility], considering dialysis  . COPD (chronic obstructive pulmonary disease) ([place])   . CAD (coronary artery disease) [place], [name]  . Acute and chronic respiratory failure 10/22/2012  . Osteoarthritis 10/22/2012  . Pneumonia April 2014  . Atrial fibrillation ([facility])     Assessment: 36 yom with ESRD-HD MWF from HD with SOB. New HD (only 2nd treatment) - received 2.5h today prior to coming in. No abx given in HD per patient/wife. Pharmacy consulted  to dose vancomycin for PNA. Cefepime ordered x 1 dose per MD in the ED. WBC 11.1  Patient currently receiving HD, appears to be tolerating well so far, BFR 400,   Goal of Therapy:  Target pre-HD level 15 - 25 mcg/mL Target post-HD level 5 - 15 mcg/mL  Plan:  1. Vancomycin 1g IV x 1 now. 2. Will f/u HD schedule for further HD plans.  Uvaldo Rising, BCPS  Clinical Pharmacist Pager [phone]  06/08/2015 5:33 PM

## 2015-06-08 NOTE — Progress Notes (Signed)
[place] for Heparin Indication: atrial fibrillation  Allergies  Allergen Reactions  . Yellow Dyes (Non-Tartrazine) Other (See Comments)    Burns arms Cough medications (tussionex and benzonatate ok)  . Levaquin [Levofloxacin In D5w] Itching    Patient Measurements: Height: 5\' 9"  (175.3 cm) Weight: 196 lb 13.9 oz (89.3 kg) IBW/kg (Calculated) : 70.7 Heparin Dosing Weight: 88 kg  Vital Signs: Temp: 98.7 F (37.1 C) (12/17 2029) Temp Source: Oral (12/17 2029) BP: 122/64 mmHg (12/17 1900) Pulse Rate: 93 (12/17 1900)  Labs:  Recent Labs  06/07/15 1515  06/08/15 0307 06/08/15 0830 06/08/15 1425 06/08/15 2011  HGB 9.7*  --  8.1*  --   --   --   HCT 30.0*  --  26.2*  --   --   --   PLT 391  --  272  --   --   --   LABPROT 25.4*  --  23.4*  --   --   --   INR 2.34*  --  2.10*  --   --   --   HEPARINUNFRC  --   --   --   --   --  0.50  CREATININE 3.87*  --  5.21*  --   --   --   TROPONINI 0.03  < > 0.08* 0.08* 0.08*  --   < > = values in this interval not displayed.  Estimated Creatinine Clearance: 12.9 mL/min (by C-G formula based on Cr of 5.21).   Medical History: Past Medical History  Diagnosis Date  . Stroke ([place]) 2012  . High cholesterol   . Hypertension   . Kidney disease     CKD4, sees [place] [facility], considering dialysis  . COPD (chronic obstructive pulmonary disease) ([place])   . CAD (coronary artery disease) [place], [name]  . Acute and chronic respiratory failure 10/22/2012  . Osteoarthritis 10/22/2012  . Pneumonia April 2014  . Atrial fibrillation ([facility])     Medications:  Infusions:  . dexmedetomidine Stopped (06/08/15 1200)  . heparin 1,100 Units/hr (06/08/15 1319)    Assessment: 80 yo male on chronic Coumadin for afib.  Pharmacy asked to start IV heparin while Coumadin on hold for possible procedures.  INR 2.1 this AM, but trending down.  Spoke to Shon Millet, NP, okay to go ahead and start IV  heparin.  CBC trending down, but no active bleeding noted.  Initial heparin level 0.5 (at goal).   Goal of Therapy:  Heparin level 0.3-0.7 units/ml Monitor platelets by anticoagulation protocol: Yes   Plan:  1. Continue IV heparin gtt at 1100 units/hr. 2. Daily heparin level and CBC. 3. F/u plans to resume oral anticoagulation when able.  Uvaldo Rising, BCPS  Clinical Pharmacist Pager [phone]  06/08/2015 8:51 PM

## 2015-06-08 NOTE — Progress Notes (Signed)
PULMONARY / CRITICAL CARE MEDICINE   Name: Terry [name] MRN: [id number] DOB: 08/11/1934    ADMISSION DATE:  06/07/2015 CONSULTATION DATE:  06/07/15  REFERRING MD:  Vanita Panda  CHIEF COMPLAINT:  Shortness of breath  HISTORY OF PRESENT ILLNESS:   81yo male with hx ESRD, AFib, COPD, CVA presented 12/16 to the ED with a 2 day history of progressive SOB and cough with yellow sputum. Had worsening respiratory status while at HD and EMS was called. Sats in 70's on arrival to ER, placed on NRB and CXR suggestive of PNA.  Initially admitted to hospitalist service but respiratory status continued to deteriorate requiring intubation and PCCM called for admission.    SUBJECTIVE:  Tension ptx post intubation.  Tol PS 10/5 this am but remains lethargic.    VITAL SIGNS: BP 151/62 mmHg  Pulse 86  Temp(Src) 98.7 F (37.1 C) (Oral)  Resp 22  Ht 5\' 9"  (1.753 m)  Wt 196 lb 13.9 oz (89.3 kg)  BMI 29.06 kg/m2  SpO2 96%  HEMODYNAMICS:    VENTILATOR SETTINGS: Vent Mode:  [-] PRVC FiO2 (%):  [50 %-100 %] 50 % Set Rate:  [16 bmp] 16 bmp Vt Set:  [500 mL-570 mL] 570 mL PEEP:  [5 cmH20] 5 cmH20 Plateau Pressure:  [14 cmH20-17 cmH20] 17 cmH20  INTAKE / OUTPUT: I/O last 3 completed shifts: In: 1234.2 [I.V.:1184.2; IV Piggyback:50] Out: 75 [Urine:22; Chest Tube:40]  PHYSICAL EXAMINATION: General: chronically ill appearing male, NAD on vent  Neuro: RASS -1 on LOW dose precedex, opens eyes to loud voice, quickly back to sleep CV:  s1s2 rrr, R chest perm cath, L chest tube to suction, no air leak  PULM: resps even non labored on PS 10/5, equal breath sounds, few scattered rhonchi, few exp wheeze  GI: abd soft, non tender, +bs Extremities:  2+ BLE edema   LABS:  BMET  Recent Labs Lab 06/03/15 1515 06/07/15 1515 06/08/15 0307  NA 125* 135 136  K 4.5 3.6 4.5  CL 86* 94* 101  CO2 24 27 22   BUN 55* 16 24*  CREATININE 8.99* 3.87* 5.21*  GLUCOSE 141* 181* 179*     Electrolytes  Recent Labs Lab 06/02/15 0427 06/03/15 1515 06/07/15 1515 06/08/15 0307  CALCIUM 8.9 8.6* 8.7* 7.9*  MG  --   --   --  2.0  PHOS 4.4 5.0*  --  5.4*    CBC  Recent Labs Lab 06/03/15 0634 06/07/15 1515 06/08/15 0307  WBC 6.5 12.9* 11.1*  HGB 8.1* 9.7* 8.1*  HCT 24.7* 30.0* 26.2*  PLT 238 391 272    Coag's  Recent Labs Lab 06/04/15 0718 06/07/15 1515 06/08/15 0307  INR 2.39* 2.34* 2.10*    Sepsis Markers  Recent Labs Lab 06/08/15 0307 06/08/15 0600  LATICACIDVEN 1.4 1.6  PROCALCITON 1.79  --     ABG  Recent Labs Lab 06/08/15  PHART 7.365  PCO2ART 46.9*  PO2ART 195.0*    Liver Enzymes  Recent Labs Lab 06/02/15 0427 06/03/15 1515 06/08/15 0307  AST  --   --  27  ALT  --   --  13*  ALKPHOS  --   --  56  BILITOT  --   --  0.8  ALBUMIN 3.4* 3.2* 2.9*    Cardiac Enzymes  Recent Labs Lab 06/08/15 0016 06/08/15 0307 06/08/15 0830  TROPONINI 0.08* 0.08* 0.08*    Glucose  Recent Labs Lab 06/03/15 1849 06/03/15 2152 06/04/15 0816 06/04/15 1202 06/07/15 2254 06/08/15  0748  GLUCAP 95 170* 106* 102* 140* 211*    Imaging Dg Chest Port 1 View  06/07/2015  CLINICAL DATA:  Chest tube readjustment EXAM: PORTABLE CHEST 1 VIEW COMPARISON:  Portable exam 2224 hours compared to 1217 hours FINDINGS: Tip of endotracheal tube projects 1.5 cm above carina. Nasogastric tube extends into stomach with proximal side-port likely above gastroesophageal junction. RIGHT jugular central venous catheter with tip projecting over RIGHT atrium. LEFT thoracostomy tube present. Enlargement of cardiac silhouette. Mediastinal contours and pulmonary vascularity normal. Atherosclerotic calcification aorta. Bibasilar atelectasis. No infiltrate, pleural effusion, or pneumothorax. IMPRESSION: Bibasilar atelectasis. Line and tube positions as above ; recommend advancing nasogastric tube 5 cm to position proximal side-port within stomach. No pneumothorax.  Electronically Signed   By: Lavonia Dana M.D.   On: 06/07/2015 23:01   Dg Chest Port 1 View  06/07/2015  CLINICAL DATA:  Chest tube placement EXAM: PORTABLE CHEST 1 VIEW COMPARISON:  06/07/2015 FINDINGS: Endotracheal tube with tip measuring 4.4 cm above the carina. Enteric tube tip is off the field of view but below the left hemidiaphragm. Left central venous catheter with tip over the cavoatrial junction. Interval left chest tube placement. Previous left chest tube remains. Minimal residual left pneumothorax in the left costophrenic angle region. Atelectasis in both lung bases. Can't exclude a small amount of pneumomediastinum on the left although this could be due to residual pneumothorax. IMPRESSION: Appliances appear in satisfactory position. Small residual left pneumothorax. Gas along the left mediastinal border could represent pneumo mediastinum or residual pneumothorax. Atelectasis in the lung bases. Electronically Signed   By: Lucienne Capers M.D.   On: 06/07/2015 23:00   Dg Chest Portable 1 View  06/07/2015  CLINICAL DATA:  Post chest decompression. EXAM: PORTABLE CHEST 1 VIEW COMPARISON:  06/07/2015 FINDINGS: Interval placement of a left chest tube. There is evacuation of most of the previous tension left pneumothorax. There is a small residual pneumothorax in the left base. The lung has mostly re- expanded with mild atelectasis in the base. Linear atelectasis demonstrated in the right lung base. Endotracheal tube with tip measuring 4.2 cm above the carina. Right central venous catheter with tip over the cavoatrial junction. IMPRESSION: Interval evacuation of left pneumothorax with re-expansion of the left lung. Minimal residual pneumothorax in the left base and mild atelectasis in both lung bases. Electronically Signed   By: Lucienne Capers M.D.   On: 06/07/2015 21:44   Dg Chest Portable 1 View  06/07/2015  CLINICAL DATA:  Initial evaluation for endotracheal tube placement. EXAM: PORTABLE  CHEST 1 VIEW COMPARISON:  Prior radiograph performed earlier on the same day. FINDINGS: There has been interval placement of an endotracheal tube with the tip approximately 2.3 cm above the carina. There is suggestion of possible early right root mediastinal shift. Cardiac silhouette is stable. Right IJ approach dual lumen hemodialysis catheter is stable. New large left pneumothorax with essentially complete collapse of the left lung medially. Suggestion of possible early right word mediastinal shift, suggesting tension component. No right-sided pneumothorax. Basilar right airspace opacities again seen, stable. IMPRESSION: 1. New large left pneumothorax with near complete collapse of the left lung. There is suggestion of early rightward mediastinal shift, concerning for tension component. 2. Tip of endotracheal tube approximately 2.3 cm above the carina. 3. Stable right basilar airspace opacity, which may reflect atelectasis or infiltrate. Critical Value/emergent results were called by telephone at the time of interpretation on 06/07/2015 at 9:25 pm to Dr. Carmin Muskrat, who verbally acknowledged  these results. Electronically Signed   By: Jeannine Boga M.D.   On: 06/07/2015 21:30   Dg Chest Port 1 View  06/07/2015  CLINICAL DATA:  Shortness of breath. EXAM: PORTABLE CHEST 1 VIEW COMPARISON:  06/01/2015 FINDINGS: There is a right IJ catheter with tip seen in the cavoatrial junction. Heart size is normal. Atelectasis is present in both lung bases. There are airspace opacities identified within both lower lobes. When compared with the previous exam the aeration to the right lower lobe is unchanged. There is worsening aeration the left lower lobe. IMPRESSION: 1. Bilateral lower lobe airspace opacities. 2. Bibasilar atelectasis. Electronically Signed   By: Kerby Moors M.D.   On: 06/07/2015 16:01   Dg Abd Portable 1v  06/08/2015  CLINICAL DATA:  OG tube placement. EXAM: PORTABLE ABDOMEN - 1 VIEW  COMPARISON:  None. FINDINGS: Enteric tube tip is in the left upper quadrant consistent with location in the body of the stomach. Visualized bowel gas pattern is normal. No small or large bowel distention. There appears to be atelectasis in the lung bases. Degenerative changes in the spine and hips. Vascular calcifications. IMPRESSION: Enteric tube tip in the left upper quadrant consistent with location in the body of the stomach. Atelectasis in the lung bases. Electronically Signed   By: Lucienne Capers M.D.   On: 06/08/2015 00:58     STUDIES:    CULTURES: Urine 12/16>>> BC x 2 12/16>>> Sputum 12/16>>>  ANTIBIOTICS: vanc 12/16>>> Cefepime 12/16>>>  SIGNIFICANT EVENTS: 12/16 intubated, L tension ptx post intubation, chest tube placed in ER   LINES/TUBES: ETT 12/16>>> R chest perm cath >>> L chest tube 12/16>>>  DISCUSSION: 80yo CM presented to ED, assessed for SOB. Intubated in ED for worsening respiratory distress and increased oxygen requirements. Treated for differential which includes HCAP, viral illness, COPD exacerbation, and acute on chronic CHF c/b inadequate volume removal with incomplete HD.   ASSESSMENT / PLAN:  PULMONARY A: Acute hypoxemic respiratory failure 2/2 HCAP vs COPD exacerbation vs. CHF exacerbation AECOPD  L PTX P:   Vent support - 8cc/kg  PS wean as tol  F/u CXR  F/u ABG Solumedrol - wean to 60mg  q12h D/c symbicort, spiriva  BD's  Volume removal per renal  Chest tube to suction  CARDIOVASCULAR HTN  CAD  AFib on coumadin  Possible Acute on Chronic Diastolic heart failure, documented on Echo 02/03/13 P:  Resume home metoprolol  ??midodrine - will d/c for now  Troponin, lactate ok  Continue hold warfarin in case further procedures needed  Heparin gtt per pharmacy when INR <2   RENAL A:  ESRD on HD P:   Renal following, HD per their plans F/u chem   GASTROINTESTINAL A: No active issues P:   Add TF 12/17 PPI   HEMATOLOGIC A:   Anemia of chronic disease leukocytosis P:  F/u CBC  Heparin gtt    INFECTIOUS A:  Sepsis 2/2 HCAP P:  Vancomycin, s/p load in ED, dosing after HD. Cefepime 1gm IV q24 hours F/u blood cultures and respiratory quant. Perform Flu assessment  ENDOCRINE A:  No active issues P:   Monitor glucose on chem   NEUROLOGIC A:  Hx of CVA, sedation for mechanical ventilation P:   RASS goal: -1 Cont low dose precedex and wean as able PRN fentanyl    FAMILY  - Updates: no family available 12/17 - Inter-disciplinary family meet or Palliative Care meeting due by:  day 7   Nickolas Madrid, NP 06/08/2015  10:06 AM Pager: [phone] or [phone]  Attending Note:  I have examined patient, reviewed labs, studies and notes. I have discussed the case with Shon Millet, and I agree with the data and plans as amended above. Hx of ESRD, presents with acute respiratory failure. Sedated on my eval today. Treating for possible HCAP and AE COPD, plan for volume removal w HD. Goal assess for SBT on 12/18.  Independent critical care time is 40 minutes.   Baltazar Apo, MD, PhD 06/08/2015, 11:52 AM [facility] [phone] or if no answer [phone]

## 2015-06-08 NOTE — Progress Notes (Signed)
ANTICOAGULATION CONSULT NOTE - Initial Consult  Pharmacy Consult for Heparin Indication: atrial fibrillation  Allergies  Allergen Reactions  . Yellow Dyes (Non-Tartrazine) Other (See Comments)    Burns arms Cough medications (tussionex and benzonatate ok)  . Levaquin [Levofloxacin In D5w] Itching    Patient Measurements: Height: 5\' 9"  (175.3 cm) Weight: 196 lb 13.9 oz (89.3 kg) IBW/kg (Calculated) : 70.7 Heparin Dosing Weight: 88 kg  Vital Signs: Temp: 98.7 F (37.1 C) (12/17 0736) Temp Source: Oral (12/17 0736) BP: 151/62 mmHg (12/17 0900) Pulse Rate: 86 (12/17 0900)  Labs:  Recent Labs  06/07/15 1515 06/08/15 0016 06/08/15 0307 06/08/15 0830  HGB 9.7*  --  8.1*  --   HCT 30.0*  --  26.2*  --   PLT 391  --  272  --   LABPROT 25.4*  --  23.4*  --   INR 2.34*  --  2.10*  --   CREATININE 3.87*  --  5.21*  --   TROPONINI 0.03 0.08* 0.08* 0.08*    Estimated Creatinine Clearance: 12.9 mL/min (by C-G formula based on Cr of 5.21).   Medical History: Past Medical History  Diagnosis Date  . Stroke ([place]) 2012  . High cholesterol   . Hypertension   . Kidney disease     CKD4, sees [place] [facility], considering dialysis  . COPD (chronic obstructive pulmonary disease) ([place])   . CAD (coronary artery disease) [place], [name]  . Acute and chronic respiratory failure 10/22/2012  . Osteoarthritis 10/22/2012  . Pneumonia April 2014  . Atrial fibrillation ([facility])     Medications:  Infusions:  . dexmedetomidine 0.3 mcg/kg/hr (06/08/15 1105)  . heparin      Assessment: 80 yo male on chronic Coumadin for afib.  Pharmacy asked to start IV heparin while Coumadin on hold for possible procedures.  INR 2.1 this AM, but trending down.  Spoke to Shon Millet, NP, okay to go ahead and start IV heparin.  CBC trending down, but no active bleeding noted.  Goal of Therapy:  Heparin level 0.3-0.7 units/ml Monitor platelets by anticoagulation protocol: Yes   Plan:  1. Start  IV heparin gtt at 1100 units/hr with no bolus. 2. Check heparin level 8 hrs after gtt starts. 3. Daily heparin level and CBC. 4. F/u plans to resume oral anticoagulation when able.  Uvaldo Rising, BCPS  Clinical Pharmacist Pager [phone]  06/08/2015 11:14 AM

## 2015-06-08 NOTE — Progress Notes (Signed)
Initial Nutrition Assessment  DOCUMENTATION CODES:  Not applicable  INTERVENTION:  Initiate TF via NG/OGT with Nepro at 25 ml/h and Prostat 30 ml BID on day 1; on day 2, increase PS to 30 ml TID and increase TF to goal rate of 35 ml/h (840 ml per day) to provide 1812 kcals, 113 gm protein, 611 ml free water daily.  NUTRITION DIAGNOSIS:  Increased nutrient needs related to chronic illness (hd esrd) as evidenced by estimated nutritional requirements for this condition  GOAL:  Patient will meet greater than or equal to 90% of their needs  MONITOR:  Labs, Weight trends, TF tolerance, Diet advancement, Vent status  REASON FOR ASSESSMENT:  Consult Enteral/tube feeding initiation and management  ASSESSMENT:  80 y/o male PMhx Stroke, HTN, ESRD-newly on HD, CHF, COPD, CAD, Acute/chronic resp failure, PNA, A FIB who presents after having worsening SOB w/ cough. Was only able to tolerate 1 hr of HD yesterday. Transported from dialysis to ED and subsequently intubated for resp failure. Was hospitalized 11/21-12/13 w/ AoC RENAL failure and was started on HD at that time. This admission he is worked up for HCAP.   No family present to obtain hx from. Per EMR documentation, he appeared to be eating >50% of his meals at the end of his last admission.   Per nurse, potential extubation and HD today.   Abdomen soft, non distended. NFPE Largely WDL, potential mild orbital wasting  Edema: Moderate lower extremity, likely due to incomplete HD  Patient is currently intubated on ventilator support MV: 8.4 L/min Temp (24hrs), Avg:98.6 F (37 C), Min:98.2 F (36.8 C), Max:99 F (37.2 C)  Propofol: None at this time  Labs reviewed: Hyperglycemic, elevated Phos, k: 4.5,   Diet Order:     Skin:  Ecchymosis on abdomen  Last BM:  PTA  Height:  Ht Readings from Last 1 Encounters:  06/08/15 5\' 9"  (1.753 m)   Weight:  Wt Readings from Last 1 Encounters:  06/08/15 196 lb 13.9 oz (89.3 kg)   Wt  Readings from Last 10 Encounters:  06/08/15 196 lb 13.9 oz (89.3 kg)  06/01/15 192 lb 7.4 oz (87.3 kg)  05/01/15 207 lb (93.895 kg)  03/20/15 208 lb (94.348 kg)  03/01/15 208 lb 4.8 oz (94.484 kg)  02/26/15 210 lb (95.255 kg)  10/30/14 203 lb 1.9 oz (92.135 kg)  10/22/14 202 lb (91.627 kg)  08/28/14 203 lb 8 oz (92.307 kg)  08/20/14 205 lb 6.4 oz (93.169 kg)  193 lbs (87.73 kg) dosing weight, which was his lowest wt during last admission.   Ideal Body Weight:  72.73 kg  BMI:  Body mass index is 29.06 kg/(m^2).  SBW: 77 Kgs Estimated Nutritional Needs:  Kcal:  1805 kcals Protein:  108-123 g (1.4-1.6 g/kg SBW) Fluid:  Per MD  EDUCATION NEEDS:  No education needs identified at this time  Burtis Junes RD, LDN Nutrition Pager: [phone] 06/08/2015 12:28 PM

## 2015-06-08 NOTE — Consult Note (Signed)
Renal Service Consult Note Terry [name] Roney Jaffe D Requesting Physician:  Dr Titus Mould  Reason for Consult:  ESRD pt w acute resp failure HPI: The patient is a 80 y.o. year-old with hx of HTN, CVA, HL, COPD and CKD just started on HD 2 wks ago during admit here. DC"d to home (SNF was recommended but pt refused) and got OP HD at [facility] last week.  He developed SOB during HD yesterday , got 2 hrs of HD and was sent to ED. In ED was hypoxic and got worse over time requiring BiPap then intubation. After intubation he developed tension PTX on L side treated w needle puncture then chest tube placement. CXR on admission poss left RC density, no CHF or edema. CXR today unchanged, obscured L hemidiaph, no edema.   Patient is alert on the vent.  No CP. Wife give hx, no recent cough, fevers, hemop, HA.  No abd pain, n/v/d. He was reportedly real weak when he left the hospital last week per the wife.  SNF placement was recommended but pt refused.    Past Medical History  Past Medical History  Diagnosis Date  . Stroke ([place]) 2012  . High cholesterol   . Hypertension   . Kidney disease     CKD4, sees [place] [facility], considering dialysis  . COPD (chronic obstructive pulmonary disease) ([place])   . CAD (coronary artery disease) [place], [name]  . Acute and chronic respiratory failure 10/22/2012  . Osteoarthritis 10/22/2012  . Pneumonia April 2014  . Atrial fibrillation [facility])    Past Surgical History  Past Surgical History  Procedure Laterality Date  . Coronary stent placement  1999    2 stents  . Appendectomy  1984  . Colon surgery  2012    partial colon removed - twisted bowel  . Hernia repair      Right inguinal  . Cystoscopy/retrograde/ureteroscopy Bilateral 04/23/2014    Procedure: CYSTOSCOPY BILATERAL RETROGRADE,  LEFT URETEROSCOPY, LEFT STENT PLACEMENT;  Surgeon: Raynelle Bring, MD;  Location: [facility] ORS;  Service: Urology;  Laterality:  Bilateral;  . Av fistula placement Right 05/21/2015    Procedure: Right Arm Brachiocephalic ARTERIOVENOUS (AV) FISTULA CREATION;  Surgeon: Angelia Mould, MD;  Location: [facility] OR;  Service: Vascular;  Laterality: Right;   Family History  Family History  Problem Relation Age of Onset  . Cancer Mother     Breast  . Heart disease Father   . Heart attack Father   . Parkinson's disease Brother    Social History  reports that he quit smoking about 15 years ago. His smoking use included Cigarettes, Pipe, and Cigars. He has a 49 pack-year smoking history. He has never used smokeless tobacco. He reports that he does not drink alcohol or use illicit drugs. Allergies  Allergies  Allergen Reactions  . Yellow Dyes (Non-Tartrazine) Other (See Comments)    Burns arms Cough medications (tussionex and benzonatate ok)  . Levaquin [Levofloxacin In D5w] Itching   Home medications Prior to Admission medications   Medication Sig Start Date End Date Taking? Authorizing Provider  albuterol (VENTOLIN HFA) 108 (90 BASE) MCG/ACT inhaler Inhale 2 puffs into the lungs every 4 (four) hours as needed for wheezing or shortness of breath. 05/09/15  Yes [name], DO  amLODipine (NORVASC) 10 MG tablet Take 1 tablet (10 mg total) by mouth daily. 06/06/15  Yes Burnell Blanks, MD  atorvastatin (LIPITOR) 20 MG tablet Take  1 tablet (20 mg total) by mouth daily. Patient taking differently: Take 20 mg by mouth daily at 6 PM.  03/25/15  Yes Burnell Blanks, MD  benzonatate (TESSALON) 200 MG capsule Take 1 capsule (200 mg total) by mouth 3 (three) times daily as needed for cough. 06/04/15  Yes Ripudeep Krystal Eaton, MD  budesonide-formoterol (SYMBICORT) 160-4.5 MCG/ACT inhaler Inhale 2 puffs into the lungs 2 (two) times daily. 11/07/14  Yes Tanda Rockers, MD  clonazePAM (KLONOPIN) 1 MG tablet Take 1 tablet (1 mg total) by mouth daily as needed for anxiety (for HD treatment). 06/04/15  Yes Ripudeep Krystal Eaton, MD   guaiFENesin-dextromethorphan (ROBITUSSIN DM) 100-10 MG/5ML syrup Take 5 mLs by mouth every 4 (four) hours as needed for cough. 06/04/15  Yes Ripudeep Krystal Eaton, MD  HYDROcodone-acetaminophen (NORCO/VICODIN) 5-325 MG tablet Take 1 tablet by mouth every 6 (six) hours as needed for moderate pain or severe pain. 06/04/15  Yes Ripudeep K Rai, MD  ipratropium-albuterol (DUONEB) 0.5-2.5 (3) MG/3ML SOLN Take 3 mLs by nebulization 3 (three) times daily. 06/04/15  Yes Ripudeep Krystal Eaton, MD  magic mouthwash SOLN Take 5 mLs by mouth 4 (four) times daily. X 10days 06/04/15  Yes Ripudeep Krystal Eaton, MD  methocarbamol (ROBAXIN) 500 MG tablet Take 1 tablet (500 mg total) by mouth every 8 (eight) hours as needed for muscle spasms. 06/04/15  Yes Ripudeep Krystal Eaton, MD  metoprolol tartrate (LOPRESSOR) 25 MG tablet Take 1 tablet (25 mg total) by mouth 2 (two) times daily. 06/04/15  Yes Ripudeep Krystal Eaton, MD  midodrine (PROAMATINE) 10 MG tablet Take 1 tablet (10 mg total) by mouth 3 (three) times daily with meals. 06/04/15  Yes Ripudeep Krystal Eaton, MD  polyethylene glycol (MIRALAX) packet Take 17 g by mouth daily as needed for moderate constipation. 06/04/15  Yes Ripudeep Krystal Eaton, MD  senna-docusate (SENOKOT S) 8.6-50 MG tablet Take 2 tablets by mouth at bedtime as needed for mild constipation. 06/04/15  Yes Ripudeep K Rai, MD  tiotropium (SPIRIVA HANDIHALER) 18 MCG inhalation capsule INHALE THE CONTENTS OF ONE CAPSULE INTO LUNGS ONCE DAILY Patient taking differently: Place 18 mcg into inhaler and inhale daily.  04/25/15  Yes Tanda Rockers, MD  warfarin (COUMADIN) 4 MG tablet Take 1 tablet (4 mg total) by mouth daily at 6 PM. Dose needs to be adjusted by your doctor 06/04/15  Yes Mendel Corning, MD   Liver Function Tests  Recent Labs Lab 06/02/15 0427 06/03/15 1515 06/08/15 0307  AST  --   --  27  ALT  --   --  13*  ALKPHOS  --   --  56  BILITOT  --   --  0.8  PROT  --   --  6.0*  ALBUMIN 3.4* 3.2* 2.9*   No results for input(s):  LIPASE, AMYLASE in the last 168 hours. CBC  Recent Labs Lab 06/03/15 0634 06/07/15 1515 06/08/15 0307  WBC 6.5 12.9* 11.1*  NEUTROABS  --  9.9*  --   HGB 8.1* 9.7* 8.1*  HCT 24.7* 30.0* 26.2*  MCV 90.8 93.8 97.8  PLT 238 391 [id number]   Basic Metabolic Panel  Recent Labs Lab 06/02/15 0427 06/03/15 1515 06/07/15 1515 06/08/15 0307 06/08/15 1055  NA 132* 125* 135 136  --   K 4.0 4.5 3.6 4.5  --   CL 92* 86* 94* 101  --   CO2 27 24 27 22   --   GLUCOSE 93 141* 181* 179*  --  BUN 38* 55* 16 24*  --   CREATININE 7.06* 8.99* 3.87* 5.21*  --   CALCIUM 8.9 8.6* 8.7* 7.9*  --   PHOS 4.4 5.0*  --  5.4* 5.3*    Filed Vitals:   06/08/15 1125 06/08/15 1151 06/08/15 1200 06/08/15 1300  BP: 150/65  124/65 143/71  Pulse: 77  101 80  Temp:  98.4 F (36.9 C)    TempSrc:  Oral    Resp: 19  24 18   Height:      Weight:      SpO2: 97%  96% 94%   Exam On vent responsive No rash, cyanosis or gangrene Sclera anicteric, throat clear No jvd Chest clear bilat no rales or wheezing RRR no mrg Abd soft ntnd obese no ascites no hsm GU normal  R chest IJ cath R arm AVF maturing Neuro is alert , responsive Ext 1-2+ pitting pretib edema bilat  Na 136 K 4.5  BUN 24 Cr 5.1  Alb 2.9  Mg 2.0 LFT's ok Trop 0.08 x 3 flat WBC 11k Hb 8.1  plt 272  TTS [facility]  4h  2/2.25 bath  86kg  Hep 8600  R IJ cath / RUE AVF(maturing) Venofer 100/ hd x 10   Hect 1 ug   Assessment: 1 Acute resp failure - no pulm edema , normal CXR , doubt volume as cause. COPD +/- PNA (L base). Per CCM.  2 ESRD recent start 2 wks ago 3 Vol has some vol excess w LE edema 4 COPD on home O2 5 Afib on coum/ MTP 6 Hx CVA 7 HTN amlod/ MTP. On midodrine as well 8 Anemia cont Fe, add esa. Hb 8 9 MBD cont vit D. P 5.3, consider binder when extubated   Plan - HD today, UF 3-4kg . IV Fe. Darbe 60/wk.   Kelly Splinter MD [employer] pager [phone]    cell [phone] [name], 1:17 PM

## 2015-06-08 NOTE — Progress Notes (Signed)
Progress Note regarding Vancomycin  RN called ~0230 and only 1/2 of initial 2gm Vancomycin bag infused in ED - then due to need for pressors and limited access, the Vancomycin was disconnected and clamped off. This bag cannot be restarted. Will give an additional 1gm IV vancomycin now to equal total load of 2gm.  Sherlon Handing, PharmD, BCPS Clinical pharmacist, pager [phone] 06/08/2015 2:30 AM

## 2015-06-09 ENCOUNTER — Inpatient Hospital Stay (HOSPITAL_COMMUNITY): Payer: Medicare PPO

## 2015-06-09 ENCOUNTER — Other Ambulatory Visit: Payer: Medicare PPO

## 2015-06-09 DIAGNOSIS — I482 Chronic atrial fibrillation: Secondary | ICD-10-CM

## 2015-06-09 LAB — RENAL FUNCTION PANEL
ALBUMIN: 2.6 g/dL — AB (ref 3.5–5.0)
Anion gap: 12 (ref 5–15)
BUN: 29 mg/dL — AB (ref 6–20)
CHLORIDE: 96 mmol/L — AB (ref 101–111)
CO2: 24 mmol/L (ref 22–32)
CREATININE: 4.01 mg/dL — AB (ref 0.61–1.24)
Calcium: 8.4 mg/dL — ABNORMAL LOW (ref 8.9–10.3)
GFR, EST AFRICAN AMERICAN: 15 mL/min — AB (ref >60)
GFR, EST NON AFRICAN AMERICAN: 13 mL/min — AB (ref >60)
Glucose, Bld: 211 mg/dL — ABNORMAL HIGH (ref 65–99)
PHOSPHORUS: 4.5 mg/dL (ref 2.5–4.6)
Potassium: 3.5 mmol/L (ref 3.5–5.1)
Sodium: 132 mmol/L — ABNORMAL LOW (ref 135–145)

## 2015-06-09 LAB — GLUCOSE, CAPILLARY
GLUCOSE-CAPILLARY: 159 mg/dL — AB (ref 65–99)
GLUCOSE-CAPILLARY: 156 mg/dL — AB (ref 65–99)
GLUCOSE-CAPILLARY: 151 mg/dL — AB (ref 65–99)
GLUCOSE-CAPILLARY: 145 mg/dL — AB (ref 65–99)
Glucose-Capillary: 164 mg/dL — ABNORMAL HIGH (ref 65–99)
Glucose-Capillary: 165 mg/dL — ABNORMAL HIGH (ref 65–99)

## 2015-06-09 LAB — PHOSPHORUS
PHOSPHORUS: 5.2 mg/dL — AB (ref 2.5–4.6)
Phosphorus: 4 mg/dL (ref 2.5–4.6)

## 2015-06-09 LAB — CBC
HCT: 26.6 % — ABNORMAL LOW (ref 39.0–52.0)
Hemoglobin: 8.4 g/dL — ABNORMAL LOW (ref 13.0–17.0)
MCH: 30.4 pg (ref 26.0–34.0)
MCHC: 31.6 g/dL (ref 30.0–36.0)
MCV: 96.4 fL (ref 78.0–100.0)
PLATELETS: 308 10*3/uL (ref 150–400)
RBC: 2.76 MIL/uL — ABNORMAL LOW (ref 4.22–5.81)
RDW: 19.1 % — AB (ref 11.5–15.5)
WBC: 13.1 10*3/uL — ABNORMAL HIGH (ref 4.0–10.5)

## 2015-06-09 LAB — PROTIME-INR
INR: 2.3 — ABNORMAL HIGH (ref 0.00–1.49)
INR: 1.71 — ABNORMAL HIGH (ref 0.00–1.49)
PROTHROMBIN TIME: 25.1 s — AB (ref 11.6–15.2)
PROTHROMBIN TIME: 20 s — AB (ref 11.6–15.2)

## 2015-06-09 LAB — TROPONIN I
TROPONIN I: 0.06 ng/mL — AB (ref <0.031)
TROPONIN I: 0.08 ng/mL — AB (ref <0.031)
Troponin I: 0.16 ng/mL — ABNORMAL HIGH (ref <0.031)

## 2015-06-09 LAB — MAGNESIUM
MAGNESIUM: 2.2 mg/dL (ref 1.7–2.4)
Magnesium: 2.3 mg/dL (ref 1.7–2.4)

## 2015-06-09 LAB — HEPARIN LEVEL (UNFRACTIONATED): HEPARIN UNFRACTIONATED: 0.5 [IU]/mL (ref 0.30–0.70)

## 2015-06-09 MED ORDER — VANCOMYCIN HCL IN DEXTROSE 1-5 GM/200ML-% IV SOLN
1000.0000 mg | Freq: Once | INTRAVENOUS | Status: AC
  Administered 2015-06-09: 1000 mg via INTRAVENOUS
  Filled 2015-06-09: qty 200

## 2015-06-09 MED ORDER — FENTANYL CITRATE (PF) 100 MCG/2ML IJ SOLN
12.5000 ug | INTRAMUSCULAR | Status: DC | PRN
  Administered 2015-06-09 – 2015-06-10 (×3): 25 ug via INTRAVENOUS
  Administered 2015-06-12: 12.5 ug via INTRAVENOUS
  Administered 2015-06-12 – 2015-06-27 (×19): 25 ug via INTRAVENOUS
  Filled 2015-06-09 (×24): qty 2

## 2015-06-09 MED ORDER — DOCUSATE SODIUM 100 MG PO CAPS
100.0000 mg | ORAL_CAPSULE | Freq: Two times a day (BID) | ORAL | Status: DC
  Administered 2015-06-09 – 2015-06-21 (×10): 100 mg via ORAL
  Filled 2015-06-09 (×27): qty 1

## 2015-06-09 MED ORDER — CHLORHEXIDINE GLUCONATE 0.12 % MT SOLN
15.0000 mL | Freq: Two times a day (BID) | OROMUCOSAL | Status: DC
  Administered 2015-06-10 – 2015-06-12 (×4): 15 mL via OROMUCOSAL

## 2015-06-09 MED ORDER — POLYETHYLENE GLYCOL 3350 17 G PO PACK
17.0000 g | PACK | Freq: Every day | ORAL | Status: DC | PRN
  Filled 2015-06-09: qty 1

## 2015-06-09 MED ORDER — GUAIFENESIN 100 MG/5ML PO SOLN
5.0000 mL | ORAL | Status: DC | PRN
  Administered 2015-06-09 – 2015-06-26 (×7): 100 mg via ORAL
  Filled 2015-06-09 (×10): qty 5

## 2015-06-09 MED ORDER — FENTANYL CITRATE (PF) 100 MCG/2ML IJ SOLN
50.0000 ug | Freq: Once | INTRAMUSCULAR | Status: AC
  Administered 2015-06-09: 50 ug via INTRAVENOUS

## 2015-06-09 MED ORDER — HEPARIN (PORCINE) IN NACL 100-0.45 UNIT/ML-% IJ SOLN
1050.0000 [IU]/h | INTRAMUSCULAR | Status: DC
  Administered 2015-06-09 – 2015-06-10 (×2): 1100 [IU]/h via INTRAVENOUS
  Administered 2015-06-11: 1050 [IU]/h via INTRAVENOUS
  Filled 2015-06-09 (×3): qty 250

## 2015-06-09 MED ORDER — CETYLPYRIDINIUM CHLORIDE 0.05 % MT LIQD
7.0000 mL | Freq: Two times a day (BID) | OROMUCOSAL | Status: DC
Start: 2015-06-10 — End: 2015-06-16
  Administered 2015-06-10 – 2015-06-15 (×10): 7 mL via OROMUCOSAL

## 2015-06-09 NOTE — Progress Notes (Signed)
[facility] Progress Note Patient Name: JAMARIEN MUDDIMAN DOB: 1935-06-22 MRN: [id number]   Date of Service  06/09/2015  HPI/Events of Note  Chest pain left chest.  Patient has chest tube on that side.  EKG no acute changes.  eICU Interventions  cxr Fentanyl Cardiac enzymes     Intervention Category Intermediate Interventions: Pain - evaluation and management  DIVYANSH, LYMON, P 06/09/2015, 5:15 AM

## 2015-06-09 NOTE — Progress Notes (Addendum)
[facility] for Heparin Indication: atrial fibrillation  Allergies  Allergen Reactions  . Yellow Dyes (Non-Tartrazine) Other (See Comments)    Burns arms Cough medications (tussionex and benzonatate ok)  . Levaquin [Levofloxacin In D5w] Itching    Patient Measurements: Height: 5\' 9"  (175.3 cm) Weight: 194 lb 14.2 oz (88.4 kg) IBW/kg (Calculated) : 70.7 Heparin Dosing Weight: 88 kg  Vital Signs: Temp: 99.7 F (37.6 C) (12/18 0803) Temp Source: Oral (12/18 0803) BP: 145/61 mmHg (12/18 0709) Pulse Rate: 106 (12/18 0709)  Labs:  Recent Labs  06/07/15 1515  06/08/15 0307 06/08/15 0830 06/08/15 1425 06/08/15 2011 06/09/15 0600  HGB 9.7*  --  8.1*  --   --   --  8.4*  HCT 30.0*  --  26.2*  --   --   --  26.6*  PLT 391  --  272  --   --   --  308  LABPROT 25.4*  --  23.4*  --   --   --  25.1*  INR 2.34*  --  2.10*  --   --   --  2.30*  HEPARINUNFRC  --   --   --   --   --  0.50 0.50  CREATININE 3.87*  --  5.21*  --   --   --  4.01*  TROPONINI 0.03  < > 0.08* 0.08* 0.08*  --  0.06*  < > = values in this interval not displayed.  Estimated Creatinine Clearance: 16.7 mL/min (by C-G formula based on Cr of 4.01).   Medical History: Past Medical History  Diagnosis Date  . Stroke ([place]) 2012  . High cholesterol   . Hypertension   . Kidney disease     CKD4, sees [place] [facility], considering dialysis  . COPD (chronic obstructive pulmonary disease) ([place])   . CAD (coronary artery disease) [place], [name]  . Acute and chronic respiratory failure 10/22/2012  . Osteoarthritis 10/22/2012  . Pneumonia April 2014  . Atrial fibrillation (HCC)     Medications:  Infusions:  . dexmedetomidine Stopped (06/08/15 1200)    Assessment: 80 yo male on chronic Coumadin for afib.  Pharmacy asked to start IV heparin while Coumadin on hold for possible procedures.  Heparin was started yesterday with INR 2.1, anticipating that INR would fall  throughout the day.  Heparin level was therapeutic at 0.5 on 1100 units/hr.  However, INR actually increased today to 2.3 despite no Coumadin since 12/15. Spoke with K. Whiteheart, NP, will hold heparin for now.  CBC stable, but no active bleeding noted.   Goal of Therapy:  Heparin level 0.3-0.7 units/ml Monitor platelets by anticoagulation protocol: Yes   Plan:  1. Hold IV heparin for now, restart when INR < 2. 2. Recheck INR at 1800 tonight. 3. F/u plans to resume oral anticoagulation when able.  Uvaldo Rising, BCPS  Clinical Pharmacist Pager [phone]  06/09/2015 9:11 AM    Addendum: INR is 1.71 and now <2. Pharmacy will resume IV heparin for Afib at previous therapeutic rate.  Heparin drip at 1100 units/hr 8 hr HL Daily HL and CBC  [facility], Pharm.D., BCPS Clinical Pharmacist Pager: [phone] 06/09/2015 5:14 PM

## 2015-06-09 NOTE — Progress Notes (Addendum)
[facility] Progress Note   Subjective: extubated. SaO2's borderline 88- 90% on [place] 6L, CXR no change, no edema, L hemidiaph obscured again.   Filed Vitals:   06/09/15 1537 06/09/15 1600 06/09/15 1628 06/09/15 1630  BP:  154/72    Pulse:  107    Temp:   98.6 F (37 C) 99.3 F (37.4 C)  TempSrc:   Oral Oral  Resp:  20    Height:      Weight:      SpO2: 89% 91%      Inpatient medications: . antiseptic oral rinse  7 mL Mouth Rinse QID  . atorvastatin  20 mg Oral q1800  . ceFEPime (MAXIPIME) IV  1 g Intravenous Q24H  . chlorhexidine gluconate  15 mL Mouth Rinse BID  . [START ON 06/17/2015] darbepoetin (ARANESP) injection - DIALYSIS  60 mcg Intravenous Q Mon-HD  . famotidine (PEPCID) IV  20 mg Intravenous Q24H  . [START ON 06/10/2015] ferric gluconate (FERRLECIT/NULECIT) IV  125 mg Intravenous Q M,W,F-HD  . insulin aspart  0-15 Units Subcutaneous 6 times per day  . ipratropium-albuterol  3 mL Nebulization Q6H  . methylPREDNISolone (SOLU-MEDROL) injection  60 mg Intravenous Q12H  . metoprolol tartrate  25 mg Oral BID  . pantoprazole (PROTONIX) IV  40 mg Intravenous Q24H  . sodium chloride  3 mL Intravenous Q12H  . sodium chloride  3 mL Intravenous Q12H  . succinylcholine  100 mg Intravenous Once   . dexmedetomidine Stopped (06/08/15 1200)   sodium chloride, sodium chloride, sodium chloride, sodium chloride, acetaminophen, alteplase, clonazePAM, fentaNYL (SUBLIMAZE) injection, heparin, heparin, levalbuterol, lidocaine (PF), lidocaine-prilocaine, methocarbamol, ondansetron **OR** ondansetron (ZOFRAN) IV, pentafluoroprop-tetrafluoroeth, polyethylene glycol, senna-docusate, sodium chloride  Exam: Extubated, prod cough, no distress, calm No jvd Chest clear bilat no rales or wheezing RRR no mrg Abd soft ntnd obese no ascites no hsm GU normal  R chest IJ cath R arm AVF maturing Neuro is alert , responsive Ext 1+ pretib edema  Na 136 K 4.5 BUN 24 Cr 5.1 Alb  2.9 Mg 2.0 LFT's ok Trop 0.08 x 3 flat WBC 11k Hb 8.1 plt 272  MWF [facility] 4h 2/2.25 bath 86kg Hep 8600 R IJ cath / RUE AVF(maturing) Venofer 100/ hd x 10  Hect 1 ug   Assessment: 1 Acute resp failure - COPD/ PNA. On abx maxipime/vanc + IV steroid/nebs. Better.  2 ESRD recent start 2 wks ago MWF HD 3 Vol mild excess edema, up 2-3kg 4 COPD on home O2 5 Afib on coum/ MTP 6 Hx CVA 7 HTN amlod/ MTP. On midodrine as well 8 Anemia cont Fe, added esa. Hb 8 9 MBD cont vit D. P 5.3, consider binder 10 Constipation - wants stool softener and prn lax 11 L PTX s/p chest tube   Plan - HD tomorrow, upstairs if stable. Colace, prn miralax.    Kelly Splinter MD [facility] pager [phone]    cell [phone] 06/09/2015, 5:05 PM    Recent Labs Lab 06/07/15 1515 06/08/15 0307  06/08/15 2240 06/09/15 0600 06/09/15 1105  NA 135 136  --   --  132*  --   K 3.6 4.5  --   --  3.5  --   CL 94* 101  --   --  96*  --   CO2 27 22  --   --  24  --   GLUCOSE 181* 179*  --   --  211*  --   BUN 16  24*  --   --  29*  --   CREATININE 3.87* 5.21*  --   --  4.01*  --   CALCIUM 8.7* 7.9*  --   --  8.4*  --   PHOS  --  5.4*  < > 4.0 4.5 4.0  < > = values in this interval not displayed.  Recent Labs Lab 06/03/15 1515 06/08/15 0307 06/09/15 0600  AST  --  27  --   ALT  --  13*  --   ALKPHOS  --  56  --   BILITOT  --  0.8  --   PROT  --  6.0*  --   ALBUMIN 3.2* 2.9* 2.6*    Recent Labs Lab 06/07/15 1515 06/08/15 0307 06/09/15 0600  WBC 12.9* 11.1* 13.1*  NEUTROABS 9.9*  --   --   HGB 9.7* 8.1* 8.4*  HCT 30.0* 26.2* 26.6*  MCV 93.8 97.8 96.4  PLT 391 272 308

## 2015-06-09 NOTE — Progress Notes (Signed)
Patient complained of new onset of left sided chest pain, EKG was performed, RN will continue to monitor.

## 2015-06-09 NOTE — Progress Notes (Signed)
PULMONARY / CRITICAL CARE MEDICINE   Name: Terry [name] MRN: [id number] DOB: 10/17/1934    ADMISSION DATE:  06/07/2015 CONSULTATION DATE:  06/07/15  REFERRING MD:  Vanita Panda  CHIEF COMPLAINT:  Shortness of breath  HISTORY OF PRESENT ILLNESS:   80yo male with hx ESRD, AFib, COPD, CVA presented 12/16 to the ED with a 2 day history of progressive SOB and cough with yellow sputum. Had worsening respiratory status while at HD and EMS was called. Sats in 70's on arrival to ER, placed on NRB and CXR suggestive of PNA.  Initially admitted to hospitalist service but respiratory status continued to deteriorate requiring intubation and PCCM called for admission.    SUBJECTIVE:  Underwent HD last pm Awake and tolerating PSV 5 this am C/o L sided CP, appears to be at chest tube site   VITAL SIGNS: BP 145/61 mmHg  Pulse 106  Temp(Src) 98.5 F (36.9 C) (Oral)  Resp 29  Ht 5\' 9"  (1.753 m)  Wt 88.4 kg (194 lb 14.2 oz)  BMI 28.77 kg/m2  SpO2 94%  HEMODYNAMICS:    VENTILATOR SETTINGS: Vent Mode:  [-] PRVC FiO2 (%):  [40 %-50 %] 40 % Set Rate:  [16 bmp] 16 bmp Vt Set:  [570 mL] 570 mL PEEP:  [5 cmH20] 5 cmH20 Pressure Support:  [10 cmH20] 10 cmH20 Plateau Pressure:  [14 cmH20-18 cmH20] 14 cmH20  INTAKE / OUTPUT: I/O last 3 completed shifts: In: 2246 [I.V.:1626; NG/GT:520; IV Piggyback:100] Out: 3172 [Urine:32; Other:3000; Chest Tube:140]  PHYSICAL EXAMINATION: General: chronically ill appearing male, NAD on vent  Neuro: RASS -1 on LOW dose precedex, opens eyes to loud voice, quickly back to sleep CV:  s1s2 rrr, R chest perm cath, L chest tube to suction, no air leak  PULM: resps even non labored on PS 10/5, equal breath sounds, few scattered rhonchi, few exp wheeze  GI: abd soft, non tender, +bs Extremities:  2+ BLE edema   LABS:  BMET  Recent Labs Lab 06/07/15 1515 06/08/15 0307 06/09/15 0600  NA 135 136 132*  K 3.6 4.5 3.5  CL 94* 101 96*  CO2 27 22 24   BUN 16  24* 29*  CREATININE 3.87* 5.21* 4.01*  GLUCOSE 181* 179* 211*    Electrolytes  Recent Labs Lab 06/07/15 1515 06/08/15 0307 06/08/15 1055 06/08/15 2240 06/09/15 0600  CALCIUM 8.7* 7.9*  --   --  8.4*  MG  --  2.0 2.0 1.9  --   PHOS  --  5.4* 5.3* 4.0 4.5    CBC  Recent Labs Lab 06/07/15 1515 06/08/15 0307 06/09/15 0600  WBC 12.9* 11.1* 13.1*  HGB 9.7* 8.1* 8.4*  HCT 30.0* 26.2* 26.6*  PLT 391 272 308    Coag's  Recent Labs Lab 06/07/15 1515 06/08/15 0307 06/09/15 0600  INR 2.34* 2.10* 2.30*    Sepsis Markers  Recent Labs Lab 06/08/15 0307 06/08/15 0600  LATICACIDVEN 1.4 1.6  PROCALCITON 1.79  --     ABG  Recent Labs Lab 06/08/15  PHART 7.365  PCO2ART 46.9*  PO2ART 195.0*    Liver Enzymes  Recent Labs Lab 06/03/15 1515 06/08/15 0307 06/09/15 0600  AST  --  27  --   ALT  --  13*  --   ALKPHOS  --  56  --   BILITOT  --  0.8  --   ALBUMIN 3.2* 2.9* 2.6*    Cardiac Enzymes  Recent Labs Lab 06/08/15 0830 06/08/15 1425 06/09/15 0600  TROPONINI 0.08* 0.08* 0.06*    Glucose  Recent Labs Lab 06/08/15 0748 06/08/15 1157 06/08/15 1621 06/08/15 2012 06/09/15 0026 06/09/15 0408  GLUCAP 211* 184* 131* 123* 165* 159*    Imaging No results found.   STUDIES:    CULTURES: Urine 12/16>>> no UOP BC x 2 12/16>>> Sputum 12/16>>>  ANTIBIOTICS: vanc 12/16>>> Cefepime 12/16>>>  SIGNIFICANT EVENTS: 12/16 intubated, L tension ptx post intubation, chest tube placed in ER   LINES/TUBES: ETT 12/16>>> R chest perm cath >>> L chest tube 12/16>>>  DISCUSSION: 80yo CM presented to ED, assessed for SOB. Intubated in ED for worsening respiratory distress and increased oxygen requirements. Treated for differential which includes HCAP, viral illness, COPD exacerbation, and acute on chronic CHF c/b inadequate volume removal with incomplete HD.   ASSESSMENT / PLAN:  PULMONARY A: Acute hypoxemic respiratory failure 2/2 HCAP vs COPD  exacerbation vs. CHF exacerbation AECOPD  L PTX P:   Plan for PSV and goal extubation 12/18 F/u CXR  Solumedrol - 60mg  q12h, change to pred when able to take PO Holding symbicort, spiriva  Nebulized BD's  Volume removal per renal  Chest tube to suction, ? To waterseal 12/18 pm  CARDIOVASCULAR HTN  CAD  AFib on coumadin  Possible Acute on Chronic Diastolic heart failure, documented on Echo 02/03/13 P:  Home metoprolol  Restart midodrine when able to take PO Troponin, lactate ok  Continue hold warfarin in case further procedures needed  Heparin gtt per pharmacy when INR <2 (currently 2.3)  RENAL A:  ESRD on HD P:   Renal following, HD per their plans F/u chem   GASTROINTESTINAL A: No active issues P:   Hold TF for possible extubation PPI   HEMATOLOGIC A:  Anemia of chronic disease leukocytosis P:  F/u CBC  Heparin gtt when INR subtherapeutic   INFECTIOUS A:  Sepsis 2/2 HCAP P:  Vancomycin, s/p load in ED, dosing after HD. Cefepime 1gm IV q24 hours F/u blood cultures and respiratory quant. Perform Flu assessment  ENDOCRINE A:  No active issues P:   Monitor glucose on chem   NEUROLOGIC A:  Hx of CVA, sedation for mechanical ventilation P:   RASS goal: -1 to 0 Cont low dose precedex and wean as able PRN fentanyl    FAMILY  - Updates: no family available 12/17 or 12/18 am - Inter-disciplinary family meet or Palliative Care meeting due by:  12/24   Independent critical care time is 40 minutes.   Baltazar Apo, MD, PhD 06/09/2015, 7:29 AM [facility] [phone] or if no answer [phone]

## 2015-06-09 NOTE — Procedures (Signed)
Extubation Procedure Note  Patient Details:   Name: SHAWNTE JON DOB: 1935-02-05 MRN: [id number]   Airway Documentation:     Evaluation  O2 sats: stable throughout Complications: No apparent complications Patient did tolerate procedure well. Bilateral Breath Sounds: Rhonchi Suctioning: Oral, Airway Yes   Pt extubated to 3 L [place], increased to 5L [place] after spo2 84% on 3L. Pt clearing his throat, but encouraged to do deep cough along with slow deep breaths through nose. Pt is anxious and has to be reminded to breathe through nose with Herreid instead of mouth. Pt given Yankauer for secretion clearance. RT will continue to monitor.  Jesse Sans 06/09/2015, 10:55 AM

## 2015-06-10 ENCOUNTER — Inpatient Hospital Stay (HOSPITAL_COMMUNITY): Payer: Medicare PPO

## 2015-06-10 ENCOUNTER — Other Ambulatory Visit: Payer: Medicare PPO

## 2015-06-10 DIAGNOSIS — I25700 Atherosclerosis of coronary artery bypass graft(s), unspecified, with unstable angina pectoris: Secondary | ICD-10-CM

## 2015-06-10 LAB — CBC
HCT: 25.8 % — ABNORMAL LOW (ref 39.0–52.0)
Hemoglobin: 7.9 g/dL — ABNORMAL LOW (ref 13.0–17.0)
MCH: 29.6 pg (ref 26.0–34.0)
MCHC: 30.6 g/dL (ref 30.0–36.0)
MCV: 96.6 fL (ref 78.0–100.0)
PLATELETS: 313 10*3/uL (ref 150–400)
RBC: 2.67 MIL/uL — ABNORMAL LOW (ref 4.22–5.81)
RDW: 19.3 % — AB (ref 11.5–15.5)
WBC: 13.6 10*3/uL — ABNORMAL HIGH (ref 4.0–10.5)

## 2015-06-10 LAB — RENAL FUNCTION PANEL
Albumin: 2.7 g/dL — ABNORMAL LOW (ref 3.5–5.0)
Anion gap: 13 (ref 5–15)
BUN: 61 mg/dL — AB (ref 6–20)
CO2: 24 mmol/L (ref 22–32)
CREATININE: 5.7 mg/dL — AB (ref 0.61–1.24)
Calcium: 8.4 mg/dL — ABNORMAL LOW (ref 8.9–10.3)
Chloride: 96 mmol/L — ABNORMAL LOW (ref 101–111)
GFR calc Af Amer: 10 mL/min — ABNORMAL LOW (ref >60)
GFR calc non Af Amer: 9 mL/min — ABNORMAL LOW (ref >60)
GLUCOSE: 137 mg/dL — AB (ref 65–99)
PHOSPHORUS: 5.7 mg/dL — AB (ref 2.5–4.6)
POTASSIUM: 4 mmol/L (ref 3.5–5.1)
Sodium: 133 mmol/L — ABNORMAL LOW (ref 135–145)

## 2015-06-10 LAB — POCT I-STAT 3, ART BLOOD GAS (G3+)
Acid-Base Excess: 3 mmol/L — ABNORMAL HIGH (ref 0.0–2.0)
BICARBONATE: 28.5 meq/L — AB (ref 20.0–24.0)
O2 Saturation: 92 %
PO2 ART: 64 mmHg — AB (ref 80.0–100.0)
Patient temperature: 98.1
TCO2: 30 mmol/L (ref 0–100)
pCO2 arterial: 47.5 mmHg — ABNORMAL HIGH (ref 35.0–45.0)
pH, Arterial: 7.385 (ref 7.350–7.450)

## 2015-06-10 LAB — PHOSPHORUS
PHOSPHORUS: 6.2 mg/dL — AB (ref 2.5–4.6)
Phosphorus: 5.2 mg/dL — ABNORMAL HIGH (ref 2.5–4.6)

## 2015-06-10 LAB — CULTURE, RESPIRATORY: SPECIAL REQUESTS: NORMAL

## 2015-06-10 LAB — GLUCOSE, CAPILLARY
GLUCOSE-CAPILLARY: 108 mg/dL — AB (ref 65–99)
Glucose-Capillary: 118 mg/dL — ABNORMAL HIGH (ref 65–99)
Glucose-Capillary: 129 mg/dL — ABNORMAL HIGH (ref 65–99)
Glucose-Capillary: 133 mg/dL — ABNORMAL HIGH (ref 65–99)
Glucose-Capillary: 136 mg/dL — ABNORMAL HIGH (ref 65–99)
Glucose-Capillary: 163 mg/dL — ABNORMAL HIGH (ref 65–99)

## 2015-06-10 LAB — MAGNESIUM
MAGNESIUM: 2.4 mg/dL (ref 1.7–2.4)
MAGNESIUM: 2.3 mg/dL (ref 1.7–2.4)

## 2015-06-10 LAB — PROTIME-INR
INR: 1.61 — ABNORMAL HIGH (ref 0.00–1.49)
Prothrombin Time: 19.1 seconds — ABNORMAL HIGH (ref 11.6–15.2)

## 2015-06-10 LAB — CULTURE, RESPIRATORY W GRAM STAIN: Gram Stain: NONE SEEN

## 2015-06-10 LAB — HEPARIN LEVEL (UNFRACTIONATED): Heparin Unfractionated: 0.4 IU/mL (ref 0.30–0.70)

## 2015-06-10 LAB — LEGIONELLA ANTIGEN, URINE

## 2015-06-10 MED ORDER — ALTEPLASE 2 MG IJ SOLR
2.0000 mg | Freq: Once | INTRAMUSCULAR | Status: DC | PRN
  Filled 2015-06-10: qty 2

## 2015-06-10 MED ORDER — PENTAFLUOROPROP-TETRAFLUOROETH EX AERO: 1.0000 "application " | INHALATION_SPRAY | CUTANEOUS | Status: DC | PRN

## 2015-06-10 MED ORDER — LIDOCAINE-PRILOCAINE 2.5-2.5 % EX CREA: 1.0000 "application " | TOPICAL_CREAM | CUTANEOUS | Status: DC | PRN

## 2015-06-10 MED ORDER — DARBEPOETIN ALFA 200 MCG/0.4ML IJ SOSY
200.0000 ug | PREFILLED_SYRINGE | INTRAMUSCULAR | Status: DC
  Administered 2015-06-17 – 2015-07-01 (×3): 200 ug via INTRAVENOUS
  Filled 2015-06-10 (×3): qty 0.4

## 2015-06-10 MED ORDER — SODIUM CHLORIDE 0.9 % IV SOLN: 100.0000 mL | INTRAVENOUS | Status: DC | PRN

## 2015-06-10 MED ORDER — METOPROLOL TARTRATE 1 MG/ML IV SOLN
2.5000 mg | INTRAVENOUS | Status: DC | PRN
  Administered 2015-06-10: 2.5 mg via INTRAVENOUS
  Filled 2015-06-10: qty 5

## 2015-06-10 MED ORDER — LIDOCAINE HCL (PF) 1 % IJ SOLN: 5.0000 mL | INTRAMUSCULAR | Status: DC | PRN

## 2015-06-10 MED ORDER — DEXTROSE 5 % IV SOLN
2.0000 g | INTRAVENOUS | Status: DC
  Administered 2015-06-10 – 2015-06-12 (×2): 2 g via INTRAVENOUS
  Filled 2015-06-10 (×3): qty 2

## 2015-06-10 MED ORDER — METOPROLOL TARTRATE 25 MG PO TABS
25.0000 mg | ORAL_TABLET | Freq: Once | ORAL | Status: AC
  Administered 2015-06-10: 25 mg via ORAL
  Filled 2015-06-10: qty 1

## 2015-06-10 MED ORDER — METHYLPREDNISOLONE SODIUM SUCC 40 MG IJ SOLR
40.0000 mg | Freq: Two times a day (BID) | INTRAMUSCULAR | Status: DC
  Filled 2015-06-10 (×2): qty 1

## 2015-06-10 MED ORDER — PREDNISONE 20 MG PO TABS
40.0000 mg | ORAL_TABLET | Freq: Two times a day (BID) | ORAL | Status: DC
  Administered 2015-06-10 – 2015-06-11 (×2): 40 mg via ORAL
  Filled 2015-06-10 (×4): qty 2

## 2015-06-10 MED ORDER — DEXTROSE 5 % IV SOLN
1.0000 g | INTRAVENOUS | Status: DC
  Filled 2015-06-10: qty 1

## 2015-06-10 MED ORDER — ALPRAZOLAM 0.25 MG PO TABS
0.2500 mg | ORAL_TABLET | Freq: Once | ORAL | Status: AC
  Administered 2015-06-10: 0.25 mg via ORAL
  Filled 2015-06-10: qty 1

## 2015-06-10 MED ORDER — METOPROLOL TARTRATE 50 MG PO TABS
50.0000 mg | ORAL_TABLET | Freq: Two times a day (BID) | ORAL | Status: DC
  Administered 2015-06-10 – 2015-06-30 (×39): 50 mg via ORAL
  Filled 2015-06-10 (×43): qty 1

## 2015-06-10 MED ORDER — RENA-VITE PO TABS
1.0000 | ORAL_TABLET | Freq: Every day | ORAL | Status: DC
  Administered 2015-06-10 – 2015-07-04 (×24): 1 via ORAL
  Filled 2015-06-10 (×26): qty 1

## 2015-06-10 MED ORDER — WARFARIN - PHARMACIST DOSING INPATIENT: Freq: Every day | Status: DC

## 2015-06-10 MED ORDER — WARFARIN SODIUM 6 MG PO TABS
6.0000 mg | ORAL_TABLET | Freq: Once | ORAL | Status: AC
  Administered 2015-06-10: 6 mg via ORAL
  Filled 2015-06-10: qty 1

## 2015-06-10 MED ORDER — HEPARIN SODIUM (PORCINE) 1000 UNIT/ML DIALYSIS
2000.0000 [IU] | INTRAMUSCULAR | Status: DC | PRN
  Filled 2015-06-10: qty 2

## 2015-06-10 MED ORDER — LABETALOL HCL 5 MG/ML IV SOLN
10.0000 mg | Freq: Once | INTRAVENOUS | Status: DC
  Filled 2015-06-10: qty 4

## 2015-06-10 MED ORDER — HEPARIN SODIUM (PORCINE) 1000 UNIT/ML DIALYSIS: 1000.0000 [IU] | INTRAMUSCULAR | Status: DC | PRN

## 2015-06-10 NOTE — Progress Notes (Signed)
SLP Cancellation Note  Patient Details Name: Terry [name] MRN: [id number] DOB: [birth year]   Cancelled treatment:       BSE attempted. The patient declined to have the head of his bed elevated as he felt that it caused him to have trouble breathing when his bed was moved.   He declined any issues swallowing prior to hospitalization.  Given his current admission and recent intubation did not feel it would be safe to feed him in a reclined position.  Rx holding NPO unless that patient will allow the head of the bed to be raised.  ST will follow up tomorrow.    Shelly Flatten, MA, [facility] SLP [phone] Lamar Sprinkles 06/10/2015, 11:56 AM

## 2015-06-10 NOTE — Progress Notes (Signed)
ANTIBIOTIC & ANTICOAGULATION CONSULT NOTE - INITIAL  Pharmacy Consult for Ceftazidime; Coumadin+IV heparin Indication: Pneumonia, Afib   Allergies  Allergen Reactions  . Yellow Dyes (Non-Tartrazine) Other (See Comments)    Burns arms Cough medications (tussionex and benzonatate ok)  . Levaquin [Levofloxacin In D5w] Itching    Patient Measurements: Height: 5\' 9"  (175.3 cm) Weight: 188 lb 11.4 oz (85.6 kg) IBW/kg (Calculated) : 70.7  Vital Signs: Temp: 98.8 F (37.1 C) (12/19 0736) Temp Source: Oral (12/19 0736) BP: 171/68 mmHg (12/19 0900) Pulse Rate: 100 (12/19 0900) Intake/Output from previous day: 12/18 0701 - 12/19 0700 In: 743.2 [I.V.:378.2; NG/GT:65; IV Piggyback:300] Out: 179 [Urine:79; Chest Tube:100] Intake/Output from this shift: Total I/O In: 52 [I.V.:52] Out: 5 [Urine:5]  Labs:  Recent Labs  06/08/15 0307 06/09/15 0600 06/10/15 0150  WBC 11.1* 13.1* 13.6*  HGB 8.1* 8.4* 7.9*  PLT 272 308 313  CREATININE 5.21* 4.01* 5.70*   Estimated Creatinine Clearance: 11.6 mL/min (by C-G formula based on Cr of 5.7). No results for input(s): VANCOTROUGH, VANCOPEAK, VANCORANDOM, GENTTROUGH, GENTPEAK, GENTRANDOM, TOBRATROUGH, TOBRAPEAK, TOBRARND, AMIKACINPEAK, AMIKACINTROU, AMIKACIN in the last 72 hours.   Microbiology: Recent Results (from the past 720 hour(s))  MRSA PCR Screening     Status: Abnormal   Collection Time: 05/14/15  7:18 AM  Result Value Ref Range Status   MRSA by PCR POSITIVE (A) NEGATIVE Final    Comment:        The GeneXpert MRSA Assay (FDA approved for NASAL specimens only), is one component of a comprehensive MRSA colonization surveillance program. It is not intended to diagnose MRSA infection nor to guide or monitor treatment for MRSA infections. RESULT CALLED TO, READ BACK BY AND VERIFIED WITH: H. MILLS RN 9:55 05/14/15 ([email or web address])   Surgical pcr screen     Status: Abnormal   Collection Time: 05/21/15  5:09 AM  Result Value Ref Range  Status   MRSA, PCR POSITIVE (A) NEGATIVE Final   Staphylococcus aureus POSITIVE (A) NEGATIVE Final    Comment:        The Xpert SA Assay (FDA approved for NASAL specimens in patients over 28 years of age), is one component of a comprehensive surveillance program.  Test performance has been validated by [facility] for patients greater than or equal to 60 year old. It is not intended to diagnose infection nor to guide or monitor treatment.   Culture, Urine     Status: None   Collection Time: 06/02/15  2:59 PM  Result Value Ref Range Status   Specimen Description URINE, CATHETERIZED  Final   Special Requests zosyn and vancomycin  Final   Culture NO GROWTH 1 DAY  Final   Report Status 06/03/2015 FINAL  Final  Culture, blood (routine x 2)     Status: None (Preliminary result)   Collection Time: 06/07/15  9:29 PM  Result Value Ref Range Status   Specimen Description BLOOD LEFT FOREARM  Final   Special Requests BOTTLES DRAWN AEROBIC ONLY 6CCS  Final   Culture NO GROWTH 2 DAYS  Final   Report Status PENDING  Incomplete  Culture, blood (routine x 2)     Status: None (Preliminary result)   Collection Time: 06/07/15  9:38 PM  Result Value Ref Range Status   Specimen Description BLOOD LEFT HAND  Final   Special Requests BOTTLES DRAWN AEROBIC ONLY 4CCS  Final   Culture NO GROWTH 2 DAYS  Final   Report Status PENDING  Incomplete  Culture, respiratory (tracheal  aspirate)     Status: None   Collection Time: 06/08/15  2:19 AM  Result Value Ref Range Status   Specimen Description TRACHEAL ASPIRATE  Final   Special Requests Normal  Final   Gram Stain   Final    NO WBC SEEN NO SQUAMOUS EPITHELIAL CELLS SEEN NO ORGANISMS SEEN Performed at [facility]    Culture   Final    FEW KLEBSIELLA OXYTOCA Performed at [facility]    Report Status 06/10/2015 FINAL  Final   Organism ID, Bacteria KLEBSIELLA OXYTOCA  Final      Susceptibility   Klebsiella oxytoca - MIC*     AMPICILLIN >=32 RESISTANT Resistant     AMPICILLIN/SULBACTAM >=32 RESISTANT Resistant     CEFAZOLIN >=64 RESISTANT Resistant     CEFEPIME <=1 SENSITIVE Sensitive     CEFTAZIDIME <=1 SENSITIVE Sensitive     CEFTRIAXONE 2 INTERMEDIATE Intermediate     CIPROFLOXACIN <=0.25 SENSITIVE Sensitive     GENTAMICIN <=1 SENSITIVE Sensitive     IMIPENEM <=0.25 SENSITIVE Sensitive     PIP/TAZO >=128 RESISTANT Resistant     TOBRAMYCIN <=1 SENSITIVE Sensitive     TRIMETH/SULFA Value in next row Sensitive      <=20 SENSITIVE(NOTE)    * FEW KLEBSIELLA OXYTOCA  Culture, blood (x 2)     Status: None (Preliminary result)   Collection Time: 06/08/15  2:57 AM  Result Value Ref Range Status   Specimen Description BLOOD LEFT HAND  Final   Special Requests IN PEDIATRIC BOTTLE 2ML  Final   Culture NO GROWTH 1 DAY  Final   Report Status PENDING  Incomplete  Culture, blood (x 2)     Status: None (Preliminary result)   Collection Time: 06/08/15  3:07 AM  Result Value Ref Range Status   Specimen Description BLOOD LEFT HAND  Final   Special Requests IN PEDIATRIC BOTTLE 1 ML  Final   Culture NO GROWTH 1 DAY  Final   Report Status PENDING  Incomplete    Medical History: Past Medical History  Diagnosis Date  . Stroke ([place]) 2012  . High cholesterol   . Hypertension   . Kidney disease     CKD4, sees [place] [facility], considering dialysis  . COPD (chronic obstructive pulmonary disease) ([place])   . CAD (coronary artery disease) [place], [name]  . Acute and chronic respiratory failure 10/22/2012  . Osteoarthritis 10/22/2012  . Pneumonia April 2014  . Atrial fibrillation (HCC)     Medications:  Prescriptions prior to admission  Medication Sig Dispense Refill Last Dose  . albuterol (VENTOLIN HFA) 108 (90 BASE) MCG/ACT inhaler Inhale 2 puffs into the lungs every 4 (four) hours as needed for wheezing or shortness of breath. 1 Inhaler 0 PRN at Unknown time  . amLODipine (NORVASC) 10 MG tablet Take 1 tablet (10  mg total) by mouth daily. 90 tablet 3 06/07/2015 at Unknown time  . atorvastatin (LIPITOR) 20 MG tablet Take 1 tablet (20 mg total) by mouth daily. (Patient taking differently: Take 20 mg by mouth daily at 6 PM. ) 30 tablet 6 06/06/2015 at Unknown time  . benzonatate (TESSALON) 200 MG capsule Take 1 capsule (200 mg total) by mouth 3 (three) times daily as needed for cough. 60 capsule 0 06/07/2015 at Unknown time  . budesonide-formoterol (SYMBICORT) 160-4.5 MCG/ACT inhaler Inhale 2 puffs into the lungs 2 (two) times daily. 1 Inhaler 6 06/07/2015 at Unknown time  . clonazePAM (KLONOPIN) 1  MG tablet Take 1 tablet (1 mg total) by mouth daily as needed for anxiety (for HD treatment). 30 tablet 0 06/07/2015 at Unknown time  . guaiFENesin-dextromethorphan (ROBITUSSIN DM) 100-10 MG/5ML syrup Take 5 mLs by mouth every 4 (four) hours as needed for cough. 118 mL 0 06/07/2015 at Unknown time  . HYDROcodone-acetaminophen (NORCO/VICODIN) 5-325 MG tablet Take 1 tablet by mouth every 6 (six) hours as needed for moderate pain or severe pain. 30 tablet 0 4+5 days  . ipratropium-albuterol (DUONEB) 0.5-2.5 (3) MG/3ML SOLN Take 3 mLs by nebulization 3 (three) times daily. 360 mL 3 06/06/2015 at Unknown time  . magic mouthwash SOLN Take 5 mLs by mouth 4 (four) times daily. X 10days 15 mL 1 06/06/2015 at Unknown time  . methocarbamol (ROBAXIN) 500 MG tablet Take 1 tablet (500 mg total) by mouth every 8 (eight) hours as needed for muscle spasms. 90 tablet 1 06/05/2015  . metoprolol tartrate (LOPRESSOR) 25 MG tablet Take 1 tablet (25 mg total) by mouth 2 (two) times daily. 60 tablet 3 06/06/2015 at pm  . midodrine (PROAMATINE) 10 MG tablet Take 1 tablet (10 mg total) by mouth 3 (three) times daily with meals. 90 tablet 3 06/07/2015 at Unknown time  . polyethylene glycol (MIRALAX) packet Take 17 g by mouth daily as needed for moderate constipation. 30 each 0 PRN  . senna-docusate (SENOKOT S) 8.6-50 MG tablet Take 2 tablets by  mouth at bedtime as needed for mild constipation. 60 tablet 3 PRN  . tiotropium (SPIRIVA HANDIHALER) 18 MCG inhalation capsule INHALE THE CONTENTS OF ONE CAPSULE INTO LUNGS ONCE DAILY (Patient taking differently: Place 18 mcg into inhaler and inhale daily. ) 30 capsule 3 06/07/2015 at Unknown time  . warfarin (COUMADIN) 4 MG tablet Take 1 tablet (4 mg total) by mouth daily at 6 PM. Dose needs to be adjusted by your doctor 30 tablet 0 06/06/2015 at 700 pm   Assessment: 89 yom with ESRD-HD MWF from HD with SOB. Currently on IV heparin, Cefepime and Vancomycin for Afib and pneumonia.   ID: Tracheal aspirate grew out Klebsiella oxytoca intermediate to ceftriaxone. Has itching to Levaquin. Now narrowing antibiotic therapy to Ceftazidime. WBC 12.6, afebrile.   AC: Now resuming Coumadin with heparin bridge. INR down to 1.61. HL remains therapeutic today. Coumadin has been on hold since admission. H/H 7.9, 25.8. Plt wnl. No bleeding noted.   Home Coumadin dose: 4 mg daily    Goal of Therapy:  INR 2-3   Plan:  -Coumadin 6 mg x 1 dose today  -Continue IV heparin at 1100 units/hr. D/c when INR > 2.  -Start Ceftazidime 2 gm IV Q MWF with HD  -Monitor CBC and clinical progress -Monitor daily HL, INR and s/s of bleeding  Albertina Parr, PharmD., BCPS Clinical Pharmacist Pager [phone]

## 2015-06-10 NOTE — Progress Notes (Signed)
OT Cancellation Note  Patient Details Name: Terry [name] MRN: [id number] DOB: 1935-03-15   Cancelled Treatment:    Reason Eval/Treat Not Completed: Patient at procedure or test/ unavailable  Darlina Rumpf [place], OTR/L [id number]  06/10/2015, 1:30 PM

## 2015-06-10 NOTE — Care Management Important Message (Signed)
Important Message  Patient Details  Name: NALIN MUNDINE MRN: [id number] Date of Birth: 08-May-1935   Medicare Important Message Given:  Yes    Erenest Rasher, RN 06/10/2015, 3:49 PM

## 2015-06-10 NOTE — Procedures (Signed)
I was present at this session.  I have reviewed the session itself and made appropriate changes.  BP ^ to start, will lower with vol. avf ok.  Terry [name],[name] 12/19/20161:21 PM

## 2015-06-10 NOTE — Progress Notes (Signed)
Subjective: Interval History: has complaints better, CT pain.  Objective: Vital signs in last 24 hours: Temp:  [97.8 F (36.6 C)-99.7 F (37.6 C)] 98.7 F (37.1 C) (12/19 0426) Pulse Rate:  [90-123] 102 (12/19 0600) Resp:  [17-39] 21 (12/19 0600) BP: (136-181)/(58-79) 161/70 mmHg (12/19 0600) SpO2:  [88 %-95 %] 93 % (12/19 0600) FiO2 (%):  [40 %] 40 % (12/18 0709) Weight:  [85.6 kg (188 lb 11.4 oz)] 85.6 kg (188 lb 11.4 oz) (12/19 0600) Weight change: -2.8 kg (-6 lb 2.8 oz)  Intake/Output from previous day: 12/18 0701 - 12/19 0700 In: 666.2 [I.V.:301.2; NG/GT:65; IV Piggyback:300] Out: 179 [Urine:79; Chest Tube:100] Intake/Output this shift: Total I/O In: 400 [I.V.:150; IV Piggyback:250] Out: 179 [Urine:79; Chest Tube:100]  General appearance: alert, cooperative, mild distress and pale Resp: diminished breath sounds bibasilar, rales bibasilar and [name]>R and rhonchi LLL and wheezes bilat Chest wall: [name] CT, R IJ cath Cardio: regularly irregular rhythm and systolic murmur: holosystolic 2/6, blowing at apex GI: obese, pos bs, liver down 5 cm Extremities: LUA AVF  Lab Results:  Recent Labs  06/09/15 0600 06/10/15 0150  WBC 13.1* 13.6*  HGB 8.4* 7.9*  HCT 26.6* 25.8*  PLT 308 313   BMET:  Recent Labs  06/09/15 0600 06/10/15 0150  NA 132* 133*  K 3.5 4.0  CL 96* 96*  CO2 24 24  GLUCOSE 211* 137*  BUN 29* 61*  CREATININE 4.01* 5.70*  CALCIUM 8.4* 8.4*   No results for input(s): PTH in the last 72 hours. Iron Studies: No results for input(s): IRON, TIBC, TRANSFERRIN, FERRITIN in the last 72 hours.  Studies/Results: Dg Chest Portable 1 View  06/09/2015  CLINICAL DATA:  80 year old male with respiratory failure EXAM: PORTABLE CHEST 1 VIEW COMPARISON:  06/06/2009 and prior radiographs FINDINGS: An endotracheal tube with tip 3.6 cm above the carina, NG tube within the stomach, right IJ central venous catheter with tip overlying the superior cavoatrial junction, and left  thoracostomy tube identified. Bibasilar atelectasis again noted. There is no evidence of pneumothorax. There has been little interval change since the prior study. IMPRESSION: Unchanged appearance of the chest with bibasilar atelectasis. No evidence of pneumothorax. Electronically Signed   By: Margarette Canada M.D.   On: 06/09/2015 08:54    I have reviewed the patient's current medications.  Assessment/Plan: 1 ESRD vol xs. For HD 2 Anemia ^ ESA , on Fe 3 COPD severe primary issue 4 Chest tube 5 Sepsis Vanc,Cefapine 6 DJD 7 HCAP 8 CAD P HD, ^ esa, AB, pulm meds,     LOS: 3 days   Terry [name],Terry [name] 06/10/2015,6:59 AM

## 2015-06-10 NOTE — Evaluation (Signed)
Physical Therapy Evaluation Patient Details Name: Terry [name] MRN: [id number] DOB: 1935-06-02 Today's Date: 06/10/2015   History of Present Illness  pt is a 80 y/o male with h/o stroke, HTN, COPD, CAD, afib and OA, admitted with 2 day h/o SOB with condition deteriorating in HD.  Clinical Impression  Pt admitted with/for worsened SOB.  Pt currently limited functionally due to the problems listed. ( See problems list.)   Pt will benefit from PT to maximize function and safety in order to get ready for next venue listed below.\    Follow Up Recommendations SNF;Supervision/Assistance - 24 hour    Equipment Recommendations  Hospital bed    Recommendations for Other Services       Precautions / Restrictions Precautions Precautions: Fall Precaution Comments: monitor O2      Mobility  Bed Mobility Overal bed mobility: Needs Assistance Bed Mobility: Supine to Sit;Sit to Supine     Supine to sit: Mod assist;HOB elevated Sit to supine: HOB elevated;Min assist   General bed mobility comments: Pt moved sit to supine much more readily since that is where he wanted to be.  Transfers                 General transfer comment: After encouragement, pt agree to sit up.  Needed truncal assist due to back discomfort and ?fears  Ambulation/Gait                Stairs            Wheelchair Mobility    Modified Rankin (Stroke Patients Only)       Balance Overall balance assessment: Needs assistance Sitting-balance support: Bilateral upper extremity supported;Single extremity supported Sitting balance-Leahy Scale: Fair Sitting balance - Comments: pt sat EOB for about 25 sec, mostly with UE assist, but not necessary.                                     Pertinent Vitals/Pain Pain Assessment: No/denies pain    Home Living Family/patient expects to be discharged to:: Skilled nursing facility Living Arrangements: Spouse/significant other Available  Help at Discharge: Family Type of Home: House       Home Layout: One level        Prior Function Level of Independence: Independent               Hand Dominance        Extremity/Trunk Assessment   Upper Extremity Assessment: Defer to OT evaluation           Lower Extremity Assessment: Overall WFL for tasks assessed;Generalized weakness (proximal weakness)         Communication   Communication: No difficulties  Cognition Arousal/Alertness: Awake/alert Behavior During Therapy: Anxious;Impulsive Overall Cognitive Status: Difficult to assess Area of Impairment: Safety/judgement;Attention;Memory   Current Attention Level: Sustained     Safety/Judgement: Decreased awareness of safety   Problem Solving: Requires verbal cues;Requires tactile cues      General Comments      Exercises        Assessment/Plan    PT Assessment Patient needs continued PT services  PT Diagnosis Generalized weakness (decreased activity tolerance)   PT Problem List Decreased strength;Decreased activity tolerance;Decreased balance;Decreased mobility;Decreased knowledge of use of DME;Pain  PT Treatment Interventions DME instruction;Gait training;Functional mobility training;Therapeutic activities;Therapeutic exercise;Balance training;Patient/family education   PT Goals (Current goals can be found in the Care Plan section) Acute  Rehab PT Goals Patient Stated Goal: I just want to lay here... PT Goal Formulation: With patient Time For Goal Achievement: 06/24/15 Potential to Achieve Goals: Fair    Frequency Min 2X/week   Barriers to discharge        Co-evaluation               End of Session Equipment Utilized During Treatment: Oxygen Activity Tolerance: Patient limited by fatigue;Patient limited by pain Patient left: in bed;with call bell/phone within reach;with nursing/sitter in room Nurse Communication: Mobility status         Time: 1230-1258 PT Time  Calculation (min) (ACUTE ONLY): 28 min   Charges:   PT Evaluation $Initial PT Evaluation Tier I: 1 Procedure PT Treatments $Therapeutic Activity: 8-22 mins   PT G Codes:        Kamani Lewter, [name] 06/10/2015, 1:18 PM 06/10/2015  Donnella Sham, PT [phone]  (pager)

## 2015-06-10 NOTE — Care Management Note (Signed)
Case Management Note  Patient Details  Name: MELROY SCHNACKENBERG MRN: [id number] Date of Birth: 1934-11-09  Subjective/Objective:    HCAP, ESRD                Action/Plan: NCM spoke to pt's wife, Colbert Burek # [phone]. States wants SNF-rehab. Waiting PT/OT eval when pt stable for therapy. CSW referral for SNF placement.   Expected Discharge Date:                  Expected Discharge Plan:  Skilled Nursing Facility  In-House Referral:  Clinical Social Work  Discharge planning Services  CM Consult  Post Acute Care Choice:  NA Choice offered to:  NA  DME Arranged:  N/A DME Agency:  NA  HH Arranged:  NA HH Agency:  NA  Status of Service:  Completed, signed off  Medicare Important Message Given:    Date Medicare IM Given:    Medicare IM give by:    Date Additional Medicare IM Given:    Additional Medicare Important Message give by:     If discussed at [place] of Stay Meetings, dates discussed:    Additional Comments:  Erenest Rasher, RN 06/10/2015, 3:46 PM

## 2015-06-10 NOTE — Progress Notes (Signed)
PULMONARY / CRITICAL CARE MEDICINE   Name: Terry [name] MRN: [id number] DOB: 1934-09-27    ADMISSION DATE:  06/07/2015 CONSULTATION DATE:  06/07/15  REFERRING MD:  Vanita Panda  CHIEF COMPLAINT:  Shortness of breath  HISTORY OF PRESENT ILLNESS:   80yo male with hx ESRD, AFib, COPD, CVA presented 12/16 to the ED with a 2 day history of progressive SOB and cough with yellow sputum. Had worsening respiratory status while at HD and EMS was called.PNA vent  SUBJECTIVE:  Not on vent, no distress  VITAL SIGNS: BP 171/68 mmHg  Pulse 100  Temp(Src) 98.8 F (37.1 C) (Oral)  Resp 23  Ht 5\' 9"  (1.753 m)  Wt 85.6 kg (188 lb 11.4 oz)  BMI 27.86 kg/m2  SpO2 92%  HEMODYNAMICS:    VENTILATOR SETTINGS:    INTAKE / OUTPUT: I/O last 3 completed shifts: In: 1438.2 [I.V.:633.2; NG/GT:455; IV Piggyback:350] Out: 239 [Urine:89; Chest Tube:150]  PHYSICAL EXAMINATION: General: chronically ill appearing male, No distress Neuro: awake, O x 3 CV:  s1s2 rrr, R chest perm cath, L chest tube  PULM: scattered ronchi GI: abd soft, non tender, +bs Extremities:  2+ BLE edema   LABS:  BMET  Recent Labs Lab 06/08/15 0307 06/09/15 0600 06/10/15 0150  NA 136 132* 133*  K 4.5 3.5 4.0  CL 101 96* 96*  CO2 22 24 24   BUN 24* 29* 61*  CREATININE 5.21* 4.01* 5.70*  GLUCOSE 179* 211* 137*    Electrolytes  Recent Labs Lab 06/08/15 0307  06/08/15 2240 06/09/15 0600 06/09/15 1105 06/09/15 2310 06/10/15 0150  CALCIUM 7.9*  --   --  8.4*  --   --  8.4*  MG 2.0  < > 1.9  --  2.2 2.3  --   PHOS 5.4*  < > 4.0 4.5 4.0 5.2* 5.7*  < > = values in this interval not displayed.  CBC  Recent Labs Lab 06/08/15 0307 06/09/15 0600 06/10/15 0150  WBC 11.1* 13.1* 13.6*  HGB 8.1* 8.4* 7.9*  HCT 26.2* 26.6* 25.8*  PLT 272 308 313    Coag's  Recent Labs Lab 06/09/15 0600 06/09/15 1620 06/10/15 0150  INR 2.30* 1.71* 1.61*    Sepsis Markers  Recent Labs Lab 06/08/15 0307  06/08/15 0600  LATICACIDVEN 1.4 1.6  PROCALCITON 1.79  --     ABG  Recent Labs Lab 06/08/15  PHART 7.365  PCO2ART 46.9*  PO2ART 195.0*    Liver Enzymes  Recent Labs Lab 06/08/15 0307 06/09/15 0600 06/10/15 0150  AST 27  --   --   ALT 13*  --   --   ALKPHOS 56  --   --   BILITOT 0.8  --   --   ALBUMIN 2.9* 2.6* 2.7*    Cardiac Enzymes  Recent Labs Lab 06/09/15 0600 06/09/15 1105 06/09/15 1620  TROPONINI 0.06* 0.16* 0.08*    Glucose  Recent Labs Lab 06/09/15 1230 06/09/15 1629 06/09/15 2048 06/10/15 0016 06/10/15 0424 06/10/15 0732  GLUCAP 156* 151* 145* 118* 136* 133*    Imaging No results found.   STUDIES:    CULTURES: Urine 12/16>>> no UOP BC x 2 12/16>>> Sputum 12/16>>>klieb, int ceftriaxone  ANTIBIOTICS: vanc 12/16>>>12/19 Cefepime 12/16>>>12/19 ceftaz 12/19>>>stop 12/23  SIGNIFICANT EVENTS: 12/16 intubated, L tension ptx post intubation, chest tube placed in ER  extubated  LINES/TUBES: ETT 12/16>>>12/18 R chest perm cath >>> L chest tube 12/16>>>  DISCUSSION: 80yo CM presented to ED, assessed for SOB. Intubated in ED  for worsening respiratory distress and increased oxygen requirements. Treated for differential which includes HCAP, viral illness, COPD exacerbation, and acute on chronic CHF c/b inadequate volume removal with incomplete HD.   ASSESSMENT / PLAN:  PULMONARY A: Acute hypoxemic respiratory failure 2/2 HCAP vs COPD exacerbation vs. CHF exacerbation AECOPD  L PTX P:   IS F/u CXR now, if no PTX then dc chest tube pred Re add symbicort, spiriva  Nebulized BD's  Chest tube, no ptx, no leak, water seal x 24 hr, consider dc CT and re peat pcxr in 4 hr and in am (if above pcxr now neg)  CARDIOVASCULAR HTN  CAD  AFib on coumadin  Possible Acute on Chronic Diastolic heart failure, documented on Echo 02/03/13 P:  Home metoprolol increase to 50 mg Re start coumadin Heparin gtt per pharmacy when INR <2 (currently  2.3), to overlap still as INR 1.6  RENAL A:  ESRD on HD P:   Hd today F/u chem   GASTROINTESTINAL A: r/o dysphagia P:   slp needed Fulls only for now PPI   HEMATOLOGIC A:  Anemia of chronic disease leukocytosis P:  F/u CBC  Heparin gtt when INR subtherapeutic, start coumadin back  INFECTIOUS A:  Sepsis 2/2 HCAP P:  klieb noted Dc vanc, cefepime Add ceftaz Stop date added  ENDOCRINE A:  No active issues P:   Monitor glucose on chem   NEUROLOGIC A:  Hx of CVA, sedation for mechanical ventilation P:   PT needed   FAMILY  - Updates: no family available 12/17 or 12/18 am - Inter-disciplinary family meet or Palliative Care meeting due by:  12/24  To triad, sdu Lavon Paganini. Titus Mould, MD, [place] Pgr: [facility]

## 2015-06-10 NOTE — Progress Notes (Signed)
ANTICOAGULATION CONSULT NOTE - Follow Up Consult  Pharmacy Consult for heparin Indication: atrial fibrillation   Labs:  Recent Labs  06/08/15 0307  06/08/15 2011 06/09/15 0600 06/09/15 1105 06/09/15 1620 06/10/15 0150 06/10/15 0200  HGB 8.1*  --   --  8.4*  --   --  7.9*  --   HCT 26.2*  --   --  26.6*  --   --  25.8*  --   PLT 272  --   --  308  --   --  313  --   LABPROT 23.4*  --   --  25.1*  --  20.0*  --   --   INR 2.10*  --   --  2.30*  --  1.71*  --   --   HEPARINUNFRC  --   --  0.50 0.50  --   --   --  0.40  CREATININE 5.21*  --   --  4.01*  --   --  5.70*  --   TROPONINI 0.08*  < >  --  0.06* 0.16* 0.08*  --   --   < > = values in this interval not displayed.    Assessment/Plan:  80yo male therapeutic on heparin after resumed for low INR. Will continue gtt at current rate and monitor.   Wynona Neat, PharmD, BCPS  06/10/2015,3:51 AM

## 2015-06-10 NOTE — Progress Notes (Signed)
Patient on 100% non-rebreather and SpO2 is ranging from 93-95% and patient states that he is not having any trouble breathing. BiPAP not placed on at this time. Patient did destaturate down to 86% when transitioning between breathing treatment and non-rebreather, treatment stopped early and NRB placed back on. RN aware and RT will continue to monitor throughout the night.

## 2015-06-11 ENCOUNTER — Inpatient Hospital Stay (HOSPITAL_COMMUNITY): Payer: Medicare PPO

## 2015-06-11 ENCOUNTER — Encounter: Payer: Medicare PPO | Admitting: Family Medicine

## 2015-06-11 LAB — BASIC METABOLIC PANEL
Anion gap: 13 (ref 5–15)
BUN: 44 mg/dL — AB (ref 6–20)
CALCIUM: 8.9 mg/dL (ref 8.9–10.3)
CO2: 26 mmol/L (ref 22–32)
CREATININE: 4.39 mg/dL — AB (ref 0.61–1.24)
Chloride: 98 mmol/L — ABNORMAL LOW (ref 101–111)
GFR calc Af Amer: 14 mL/min — ABNORMAL LOW (ref >60)
GFR calc non Af Amer: 12 mL/min — ABNORMAL LOW (ref >60)
GLUCOSE: 133 mg/dL — AB (ref 65–99)
Potassium: 4.7 mmol/L (ref 3.5–5.1)
Sodium: 137 mmol/L (ref 135–145)

## 2015-06-11 LAB — GLUCOSE, CAPILLARY
GLUCOSE-CAPILLARY: 112 mg/dL — AB (ref 65–99)
Glucose-Capillary: 123 mg/dL — ABNORMAL HIGH (ref 65–99)
Glucose-Capillary: 123 mg/dL — ABNORMAL HIGH (ref 65–99)
Glucose-Capillary: 128 mg/dL — ABNORMAL HIGH (ref 65–99)
Glucose-Capillary: 164 mg/dL — ABNORMAL HIGH (ref 65–99)
Glucose-Capillary: 167 mg/dL — ABNORMAL HIGH (ref 65–99)

## 2015-06-11 LAB — CBC WITH DIFFERENTIAL/PLATELET
BASOS PCT: 1 %
Basophils Absolute: 0.2 10*3/uL — ABNORMAL HIGH (ref 0.0–0.1)
EOS ABS: 0 10*3/uL (ref 0.0–0.7)
Eosinophils Relative: 0 %
HCT: 31.6 % — ABNORMAL LOW (ref 39.0–52.0)
Hemoglobin: 9.3 g/dL — ABNORMAL LOW (ref 13.0–17.0)
LYMPHS ABS: 0.8 10*3/uL (ref 0.7–4.0)
LYMPHS PCT: 5 %
MCH: 29.3 pg (ref 26.0–34.0)
MCHC: 29.4 g/dL — ABNORMAL LOW (ref 30.0–36.0)
MCV: 99.7 fL (ref 78.0–100.0)
MONO ABS: 1.7 10*3/uL — AB (ref 0.1–1.0)
Monocytes Relative: 11 %
NEUTROS ABS: 13 10*3/uL — AB (ref 1.7–7.7)
Neutrophils Relative %: 83 %
PLATELETS: 346 10*3/uL (ref 150–400)
RBC: 3.17 MIL/uL — ABNORMAL LOW (ref 4.22–5.81)
RDW: 20.1 % — ABNORMAL HIGH (ref 11.5–15.5)
WBC: 15.7 10*3/uL — ABNORMAL HIGH (ref 4.0–10.5)

## 2015-06-11 LAB — HEPARIN LEVEL (UNFRACTIONATED)
Heparin Unfractionated: 0.72 IU/mL — ABNORMAL HIGH (ref 0.30–0.70)
Heparin Unfractionated: 0.63 IU/mL (ref 0.30–0.70)

## 2015-06-11 LAB — PROTIME-INR
INR: 1.67 — ABNORMAL HIGH (ref 0.00–1.49)
Prothrombin Time: 19.7 seconds — ABNORMAL HIGH (ref 11.6–15.2)

## 2015-06-11 LAB — BLOOD GAS, ARTERIAL
Acid-Base Excess: 0.7 mmol/L (ref 0.0–2.0)
BICARBONATE: 25.8 meq/L — AB (ref 20.0–24.0)
Drawn by: 280981
FIO2: 1
O2 Saturation: 90.6 %
PATIENT TEMPERATURE: 98.6
PCO2 ART: 48.9 mmHg — AB (ref 35.0–45.0)
PH ART: 7.342 — AB (ref 7.350–7.450)
PO2 ART: 66.5 mmHg — AB (ref 80.0–100.0)
TCO2: 27.3 mmol/L (ref 0–100)

## 2015-06-11 MED ORDER — METHYLPREDNISOLONE SODIUM SUCC 125 MG IJ SOLR
60.0000 mg | Freq: Two times a day (BID) | INTRAMUSCULAR | Status: DC
  Administered 2015-06-11: 60 mg via INTRAVENOUS
  Filled 2015-06-11 (×2): qty 0.96

## 2015-06-11 MED ORDER — WARFARIN SODIUM 6 MG PO TABS
6.0000 mg | ORAL_TABLET | Freq: Once | ORAL | Status: AC
  Administered 2015-06-11: 6 mg via ORAL
  Filled 2015-06-11: qty 1

## 2015-06-11 NOTE — Progress Notes (Signed)
ANTIBIOTIC & ANTICOAGULATION CONSULT NOTE - INITIAL  Pharmacy Consult for Ceftazidime; Coumadin+IV heparin Indication: Pneumonia, Afib   Allergies  Allergen Reactions  . Yellow Dyes (Non-Tartrazine) Other (See Comments)    Burns arms Cough medications (tussionex and benzonatate ok)  . Levaquin [Levofloxacin In D5w] Itching    Patient Measurements: Height: 5\' 9"  (175.3 cm) Weight: 194 lb 10.7 oz (88.3 kg) IBW/kg (Calculated) : 70.7  Vital Signs: Temp: 97.4 F (36.3 C) (12/20 0741) Temp Source: Oral (12/20 0741) BP: 135/70 mmHg (12/20 1000) Pulse Rate: 103 (12/20 1000) Intake/Output from previous day: 12/19 0701 - 12/20 0700 In: 616 [I.V.:506; IV Piggyback:110] Out: [id number] [Urine:55; Chest Tube:300] Intake/Output from this shift: Total I/O In: 72.9 [I.V.:72.9] Out: -   Labs:  Recent Labs  06/09/15 0600 06/10/15 0150 06/11/15 0550  WBC 13.1* 13.6* 15.7*  HGB 8.4* 7.9* 9.3*  PLT 308 313 346  CREATININE 4.01* 5.70* 4.39*   Estimated Creatinine Clearance: 15.2 mL/min (by C-G formula based on Cr of 4.39). No results for input(s): VANCOTROUGH, VANCOPEAK, VANCORANDOM, GENTTROUGH, GENTPEAK, GENTRANDOM, TOBRATROUGH, TOBRAPEAK, TOBRARND, AMIKACINPEAK, AMIKACINTROU, AMIKACIN in the last 72 hours.   Microbiology: Recent Results (from the past 720 hour(s))  MRSA PCR Screening     Status: Abnormal   Collection Time: 05/14/15  7:18 AM  Result Value Ref Range Status   MRSA by PCR POSITIVE (A) NEGATIVE Final    Comment:        The GeneXpert MRSA Assay (FDA approved for NASAL specimens only), is one component of a comprehensive MRSA colonization surveillance program. It is not intended to diagnose MRSA infection nor to guide or monitor treatment for MRSA infections. RESULT CALLED TO, READ BACK BY AND VERIFIED WITH: H. MILLS RN 9:55 05/14/15 ([email or web address])   Surgical pcr screen     Status: Abnormal   Collection Time: 05/21/15  5:09 AM  Result Value Ref Range Status   MRSA,  PCR POSITIVE (A) NEGATIVE Final   Staphylococcus aureus POSITIVE (A) NEGATIVE Final    Comment:        The Xpert SA Assay (FDA approved for NASAL specimens in patients over 96 years of age), is one component of a comprehensive surveillance program.  Test performance has been validated by [facility] for patients greater than or equal to 70 year old. It is not intended to diagnose infection nor to guide or monitor treatment.   Culture, Urine     Status: None   Collection Time: 06/02/15  2:59 PM  Result Value Ref Range Status   Specimen Description URINE, CATHETERIZED  Final   Special Requests zosyn and vancomycin  Final   Culture NO GROWTH 1 DAY  Final   Report Status 06/03/2015 FINAL  Final  Culture, blood (routine x 2)     Status: None (Preliminary result)   Collection Time: 06/07/15  9:29 PM  Result Value Ref Range Status   Specimen Description BLOOD LEFT FOREARM  Final   Special Requests BOTTLES DRAWN AEROBIC ONLY 6CCS  Final   Culture NO GROWTH 3 DAYS  Final   Report Status PENDING  Incomplete  Culture, blood (routine x 2)     Status: None (Preliminary result)   Collection Time: 06/07/15  9:38 PM  Result Value Ref Range Status   Specimen Description BLOOD LEFT HAND  Final   Special Requests BOTTLES DRAWN AEROBIC ONLY 4CCS  Final   Culture NO GROWTH 3 DAYS  Final   Report Status PENDING  Incomplete  Culture, respiratory (tracheal aspirate)  Status: None   Collection Time: 06/08/15  2:19 AM  Result Value Ref Range Status   Specimen Description TRACHEAL ASPIRATE  Final   Special Requests Normal  Final   Gram Stain   Final    NO WBC SEEN NO SQUAMOUS EPITHELIAL CELLS SEEN NO ORGANISMS SEEN Performed at [facility]    Culture   Final    FEW KLEBSIELLA OXYTOCA Performed at [facility]    Report Status 06/10/2015 FINAL  Final   Organism ID, Bacteria KLEBSIELLA OXYTOCA  Final      Susceptibility   Klebsiella oxytoca - MIC*    AMPICILLIN >=32  RESISTANT Resistant     AMPICILLIN/SULBACTAM >=32 RESISTANT Resistant     CEFAZOLIN >=64 RESISTANT Resistant     CEFEPIME <=1 SENSITIVE Sensitive     CEFTAZIDIME <=1 SENSITIVE Sensitive     CEFTRIAXONE 2 INTERMEDIATE Intermediate     CIPROFLOXACIN <=0.25 SENSITIVE Sensitive     GENTAMICIN <=1 SENSITIVE Sensitive     IMIPENEM <=0.25 SENSITIVE Sensitive     PIP/TAZO >=128 RESISTANT Resistant     TOBRAMYCIN <=1 SENSITIVE Sensitive     TRIMETH/SULFA Value in next row Sensitive      <=20 SENSITIVE(NOTE)    * FEW KLEBSIELLA OXYTOCA  Culture, blood (x 2)     Status: None (Preliminary result)   Collection Time: 06/08/15  2:57 AM  Result Value Ref Range Status   Specimen Description BLOOD LEFT HAND  Final   Special Requests IN PEDIATRIC BOTTLE 2ML  Final   Culture NO GROWTH 2 DAYS  Final   Report Status PENDING  Incomplete  Culture, blood (x 2)     Status: None (Preliminary result)   Collection Time: 06/08/15  3:07 AM  Result Value Ref Range Status   Specimen Description BLOOD LEFT HAND  Final   Special Requests IN PEDIATRIC BOTTLE 1 ML  Final   Culture NO GROWTH 2 DAYS  Final   Report Status PENDING  Incomplete    Medical History: Past Medical History  Diagnosis Date  . Stroke ([place]) 2012  . High cholesterol   . Hypertension   . Kidney disease     CKD4, sees [place] [facility], considering dialysis  . COPD (chronic obstructive pulmonary disease) ([place])   . CAD (coronary artery disease) [place], [name]  . Acute and chronic respiratory failure 10/22/2012  . Osteoarthritis 10/22/2012  . Pneumonia April 2014  . Atrial fibrillation ([facility])     Medications:  Prescriptions prior to admission  Medication Sig Dispense Refill Last Dose  . albuterol (VENTOLIN HFA) 108 (90 BASE) MCG/ACT inhaler Inhale 2 puffs into the lungs every 4 (four) hours as needed for wheezing or shortness of breath. 1 Inhaler 0 PRN at Unknown time  . amLODipine (NORVASC) 10 MG tablet Take 1 tablet (10 mg total) by  mouth daily. 90 tablet 3 06/07/2015 at Unknown time  . atorvastatin (LIPITOR) 20 MG tablet Take 1 tablet (20 mg total) by mouth daily. (Patient taking differently: Take 20 mg by mouth daily at 6 PM. ) 30 tablet 6 06/06/2015 at Unknown time  . benzonatate (TESSALON) 200 MG capsule Take 1 capsule (200 mg total) by mouth 3 (three) times daily as needed for cough. 60 capsule 0 06/07/2015 at Unknown time  . budesonide-formoterol (SYMBICORT) 160-4.5 MCG/ACT inhaler Inhale 2 puffs into the lungs 2 (two) times daily. 1 Inhaler 6 06/07/2015 at Unknown time  . clonazePAM (KLONOPIN) 1 MG tablet Take 1 tablet (  1 mg total) by mouth daily as needed for anxiety (for HD treatment). 30 tablet 0 06/07/2015 at Unknown time  . guaiFENesin-dextromethorphan (ROBITUSSIN DM) 100-10 MG/5ML syrup Take 5 mLs by mouth every 4 (four) hours as needed for cough. 118 mL 0 06/07/2015 at Unknown time  . HYDROcodone-acetaminophen (NORCO/VICODIN) 5-325 MG tablet Take 1 tablet by mouth every 6 (six) hours as needed for moderate pain or severe pain. 30 tablet 0 4+5 days  . ipratropium-albuterol (DUONEB) 0.5-2.5 (3) MG/3ML SOLN Take 3 mLs by nebulization 3 (three) times daily. 360 mL 3 06/06/2015 at Unknown time  . magic mouthwash SOLN Take 5 mLs by mouth 4 (four) times daily. X 10days 15 mL 1 06/06/2015 at Unknown time  . methocarbamol (ROBAXIN) 500 MG tablet Take 1 tablet (500 mg total) by mouth every 8 (eight) hours as needed for muscle spasms. 90 tablet 1 06/05/2015  . metoprolol tartrate (LOPRESSOR) 25 MG tablet Take 1 tablet (25 mg total) by mouth 2 (two) times daily. 60 tablet 3 06/06/2015 at pm  . midodrine (PROAMATINE) 10 MG tablet Take 1 tablet (10 mg total) by mouth 3 (three) times daily with meals. 90 tablet 3 06/07/2015 at Unknown time  . polyethylene glycol (MIRALAX) packet Take 17 g by mouth daily as needed for moderate constipation. 30 each 0 PRN  . senna-docusate (SENOKOT S) 8.6-50 MG tablet Take 2 tablets by mouth at  bedtime as needed for mild constipation. 60 tablet 3 PRN  . tiotropium (SPIRIVA HANDIHALER) 18 MCG inhalation capsule INHALE THE CONTENTS OF ONE CAPSULE INTO LUNGS ONCE DAILY (Patient taking differently: Place 18 mcg into inhaler and inhale daily. ) 30 capsule 3 06/07/2015 at Unknown time  . warfarin (COUMADIN) 4 MG tablet Take 1 tablet (4 mg total) by mouth daily at 6 PM. Dose needs to be adjusted by your doctor 30 tablet 0 06/06/2015 at 700 pm   Assessment: 75 yom with ESRD. New HD patient MWF. Presents from HD with SOB. Currently on IV heparin and ceftazidime for Afib and pneumonia.  Coumadin held at admission and resumed 12/19 with heparin bridge. INR 1.61>1.67. HL slightly supratherapeutic at 0.72. H/H 9.3, 31.6. Plt wnl. No bleeding reported.   Home Coumadin dose: 4 mg daily    Goal of Therapy:  INR 2-3 Heparin level 0.30 - 0.70   Plan:  Coumadin 6 mg x 1 dose today  Decrease heparin gtt to 1050 units/hr; D/C when INR > 2.  8hr HL Daily CBC, HL Monitor renal fcn, s/sx beeding and clinical progress   Stephens November, PharmD Clinical Pharmacy Resident 06/11/2015 11:55AM

## 2015-06-11 NOTE — Progress Notes (Signed)
Iv team consulted and patient is a difficult stick.  Dr. Emmit Alexanders was notified and he agreed to a foot stick for morning lab work.  Verbal orders were received and lab notified.

## 2015-06-11 NOTE — Clinical Social Work Note (Signed)
Clinical Social Work Assessment  Patient Details  Name: AKHILESH SPARR MRN: [id number] Date of Birth: 1935/04/21  Date of referral:  06/11/15               Reason for consult:  Facility Placement, Discharge Planning                Permission sought to share information with:  Case Manager, Family Supports, Customer service manager Permission granted to share information::  Yes, Verbal Permission Granted  Name::      [place])  Agency::   (SNF's)  Relationship::   (Spouse )  Contact Information:   [phone])  Housing/Transportation Living arrangements for the past 2 months:  Single Family Home Source of Information:  Spouse Patient Interpreter Needed:  None Criminal Activity/Legal Involvement Pertinent to Current Situation/Hospitalization:  No - Comment as needed Significant Relationships:  Spouse Lives with:  Spouse Do you feel safe going back to the place where you live?  No Need for family participation in patient care:  Yes (Comment)  Care giving concerns:  Patient readmitted into [facility] since last month. Patient currently requiring short-term rehab within SNF.    Social Worker assessment / plan: Holiday representative spoke with patient's wife at length in reference to post-acute placement for SNF. CSW introduced CSW role an SNF process. Patient recently refused SNF placement about 2 weeks ago and was discharged from [facility] with [place] services and wife's assistance. Pt's wife is currently agreeable to SNF placement and reports that she is NOT completely familiar with process. CSW thoroughly explained process and potential barriers. Pt's wife expressed understanding. Pt's wife also reported that pt goes to [facility] on [address]. Pt's wife would like for pt to remain close to that center. Pt's wife also interested in [facility] however would prefer [place] area as well. No further concerns reported at this time. CSW will continue to  follow pt and pt's family for continued support and to facilitate pt's discharge needs once medically stable.   Employment status:  Retired Nurse, adult PT Recommendations:  [place] / Referral to community resources:  [place]  Patient/Family's Response to care: Patient resting in ICU. Will likely transfer to SDU soon. Pt's wife agreeable to SNF placement and currently does not have a preference of which facility pt will transition to. Pt's wife pleasant and appreciated social work intervention.   Patient/Family's Understanding of and Emotional Response to Diagnosis, Current Treatment, and Prognosis:  Pt's wife fully aware of PT recommendations. Pt's wife supportive and involved in pt's care.   Emotional Assessment Appearance:   (Unable to Assess) Attitude/Demeanor/Rapport:  Unable to Assess Affect (typically observed):  Unable to Assess Orientation:  Oriented to Situation, Oriented to  Time, Oriented to Place, Oriented to Self Alcohol / Substance use:  Not Applicable Psych involvement (Current and /or in the community):  No (Comment)  Discharge Needs  Concerns to be addressed:  Care Coordination Readmission within the last 30 days:  No Current discharge risk:  Dependent with Mobility Barriers to Discharge:  Continued Medical Work up   [employer], MSW, LCSWA [phone] 06/11/2015 2:29 PM

## 2015-06-11 NOTE — Clinical Social Work Placement (Signed)
   CLINICAL SOCIAL WORK PLACEMENT  NOTE  Date:  06/11/2015  Patient Details  Name: Terry [name] MRN: [id number] Date of Birth: 22-Feb-1935  Clinical Social Work is seeking post-discharge placement for this patient at the [place] level of care (*CSW will initial, date and re-position this form in  chart as items are completed):  Yes   Patient/family provided with [place] Work Department's list of facilities offering this level of care within the geographic area requested by the patient (or if unable, by the patient's family).  Yes   Patient/family informed of their freedom to choose among providers that offer the needed level of care, that participate in Medicare, Medicaid or managed care program needed by the patient, have an available bed and are willing to accept the patient.  Yes   Patient/family informed of [facility]'s ownership interest in [facility] and [facility], as well as of the fact that they are under no obligation to receive care at these facilities.  PASRR submitted to EDS on 06/11/15     PASRR number received on 06/11/15     Existing PASRR number confirmed on       FL2 transmitted to all facilities in geographic area requested by pt/family on 06/11/15     FL2 transmitted to all facilities within larger geographic area on       Patient informed that his/her managed care company has contracts with or will negotiate with certain facilities, including the following:            Patient/family informed of bed offers received.  Patient chooses bed at       Physician recommends and patient chooses bed at      Patient to be transferred to   on  .  Patient to be transferred to facility by       Patient family notified on   of transfer.  Name of family member notified:        PHYSICIAN Please sign FL2     Additional Comment:    _______________________________________________ Rozell Searing, LCSW 06/11/2015,  2:38 PM

## 2015-06-11 NOTE — Progress Notes (Signed)
Subjective: Interval History: has no complaints. Better.   Objective: Vital signs in last 24 hours: Temp:  [97.4 F (36.3 C)-98.8 F (37.1 C)] 97.5 F (36.4 C) (12/20 0346) Pulse Rate:  [97-130] 107 (12/20 0600) Resp:  [18-54] 24 (12/20 0600) BP: (105-218)/(58-120) 155/77 mmHg (12/20 0600) SpO2:  [83 %-100 %] 97 % (12/20 0600) FiO2 (%):  [50 %-100 %] 100 % (12/19 1608) Weight:  [81.4 kg (179 lb 7.3 oz)-88.3 kg (194 lb 10.7 oz)] 88.3 kg (194 lb 10.7 oz) (12/20 0400) Weight change: -1.2 kg (-2 lb 10.3 oz)  Intake/Output from previous day: 12/19 0701 - 12/20 0700 In: 606 [I.V.:496; IV Piggyback:110] Out: 3855 [Urine:55; Chest Tube:300] Intake/Output this shift: Total I/O In: 234 [I.V.:234] Out: 130 [Urine:30; Chest Tube:100]  General appearance: cooperative, moderately obese and pale Resp: diminished breath sounds bilaterally, rales bibasilar and wheezes bilaterally and inspir and expir.  rhonchi and rales worse L>R base Chest wall: CT L, RIJ cath Cardio: irregularly irregular rhythm and systolic murmur: holosystolic 2/6, blowing at apex GI: pos bs, liver down 6 cm.  obese, Extremities: AVF RUA  Lab Results:  Recent Labs  06/10/15 0150 06/11/15 0550  WBC 13.6* 15.7*  HGB 7.9* 9.3*  HCT 25.8* 31.6*  PLT 313 346   BMET:  Recent Labs  06/10/15 0150 06/11/15 0550  NA 133* 137  K 4.0 4.7  CL 96* 98*  CO2 24 26  GLUCOSE 137* 133*  BUN 61* 44*  CREATININE 5.70* 4.39*  CALCIUM 8.4* 8.9   No results for input(s): PTH in the last 72 hours. Iron Studies: No results for input(s): IRON, TIBC, TRANSFERRIN, FERRITIN in the last 72 hours.  Studies/Results: Dg Chest Port 1 View  06/10/2015  CLINICAL DATA:  Left pneumothorax and chest tube. Shortness of breath. EXAM: PORTABLE CHEST 1 VIEW COMPARISON:  06/09/2015 FINDINGS: Left chest tube remains in place. Small left mid thorax noted. Left lower lobe atelectasis or infiltrate. Patchy opacities in the right mid and lower  lung have increased since prior study. Heart is upper limits normal in size. Interval extubation.  Right dialysis catheter is unchanged. IMPRESSION: Stable left chest tube position.  Small left apical pneumothorax. Stable left lower lobe atelectasis or infiltrate. Patchy opacities in the right mid and lower lung have increased. Electronically Signed   By: Rolm Baptise M.D.   On: 06/10/2015 11:05    I have reviewed the patient's current medications.  Assessment/Plan: 1 ESRD HD yest, 3.5 L off.  Vol improved , helped resp status.  Will lower again tomorrow 2 COPD severe  Slowly better 3 Anemia on esa/Fe 4 HPTH  On vit C 5 CAD 6 Chest tube 7 DJD P HD in am.  Esa, steroids, AB    LOS: 4 days   Ki Corbo,Carmel L 06/11/2015,6:55 AM

## 2015-06-11 NOTE — Evaluation (Signed)
Occupational Therapy Evaluation Patient Details Name: ELZIE YAHNKE MRN: [id number] DOB: 04-02-1935 Today's Date: 06/11/2015    History of Present Illness pt is a 80 y/o male with h/o stroke, HTN, COPD, CAD, afib and OA, admitted with 2 day h/o SOB with condition deteriorating in HD.   Clinical Impression   This 80 yo male admitted with above presents to acute OT with decreased endurance, decreased mobility, decreased balance, increased work of breathing all affecting his PLOF. He will benefit from acute OT with follow up at SNF to get back to PLOF.    Follow Up Recommendations  SNF    Equipment Recommendations   (TBD at next venue)       Precautions / Restrictions Precautions Precautions: Fall Precaution Comments: monitor O2 (mid-high 80's on non re-breather at 15 liters;with activity) Restrictions Weight Bearing Restrictions: No      Mobility Bed Mobility Overal bed mobility: Needs Assistance;+2 for physical assistance Bed Mobility: Supine to Sit;Sit to Supine     Supine to sit: Mod assist;HOB elevated Sit to supine: Mod assist;+2 for physical assistance (due to pt way far down in bed and not willing at present to try and stand to move up in bed)           Balance Overall balance assessment: Needs assistance Sitting-balance support: No upper extremity supported;Feet supported Sitting balance-Leahy Scale: Fair Sitting balance - Comments: pt sat EOB for about 3 mins, mostly without UE assist                                    ADL Overall ADL's : Needs assistance/impaired Eating/Feeding:  (A due to pt on non-rebreather at present)   Grooming: Minimal assistance;Sitting (with increased time due to increased work of breathing with activity)   Upper Body Bathing: Minimal assitance;Sitting (with increased time due to increased work of breathing with activity)   Lower Body Bathing: Total assistance;Bed level (with increased time due to increased work  of breathing with activity)   Upper Body Dressing : Moderate assistance;Sitting (with increased time due to increased work of breathing with activity)   Lower Body Dressing: Total assistance;Bed level (with increased time due to increased work of breathing with activity)                                 Pertinent Vitals/Pain Pain Assessment: Faces Pain Score: 6  Pain Location: low back while seated EOB Pain Descriptors / Indicators: Aching;Sore Pain Intervention(s): Monitored during session;Repositioned     Hand Dominance  right   Extremity/Trunk Assessment Upper Extremity Assessment Upper Extremity Assessment: Generalized weakness           Communication  No issues   Cognition Arousal/Alertness: Awake/alert Behavior During Therapy: Anxious;Agitated ("why don't yall come after I've eaten, why don't yall come in the afternoon, why don't yall come after dialysis?)--made him aware that I couldn't make that happen today, but can make note of it in our logs for future sessions)) Overall Cognitive Status: Within Functional Limits for tasks assessed                                Home Living Family/patient expects to be discharged to:: Skilled nursing facility  OT Diagnosis: Generalized weakness   OT Problem List: Decreased strength;Cardiopulmonary status limiting activity;Decreased activity tolerance;Impaired balance (sitting and/or standing);Pain   OT Treatment/Interventions: Self-care/ADL training;Patient/family education;Balance training;Therapeutic activities;DME and/or AE instruction    OT Goals(Current goals can be found in the care plan section) Acute Rehab OT Goals Patient Stated Goal: to feel/breath better OT Goal Formulation: With patient/family Time For Goal Achievement: 06/25/15 Potential to Achieve Goals: Good  OT Frequency: Min 2X/week              End of Session  Equipment Utilized During Treatment: Oxygen (non re-breather at 15 liters) Nurse Communication:  (pts sats mid-high 80's at end of sesson with non-re breather on)  Activity Tolerance: Patient limited by fatigue Patient left: in bed;with call bell/phone within reach;with family/visitor present   Time: 1153-1221 OT Time Calculation (min): 28 min Charges:  OT General Charges $OT Visit: 1 Procedure OT Evaluation $Initial OT Evaluation Tier I: 1 Procedure OT Treatments $Self Care/Home Management : 8-22 mins  Almon Register [id number] 06/11/2015, 12:41 PM

## 2015-06-11 NOTE — Progress Notes (Signed)
[facility]  Patient Status: Active (receiving services up to time of hospitalization)  [facility] is providing the following services: RN, PT and MSW  If patient discharges after hours, please call [phone].   Terry [name] 06/11/2015, 1:38 PM

## 2015-06-11 NOTE — Progress Notes (Signed)
Pt placed on HFNC per Dr. Titus Mould order.  Pt states that he is not going to be able to tolerate, that the flow is too uncomfortable.  Pt placed back on NRB at this time.  RT will monitor.

## 2015-06-11 NOTE — Progress Notes (Signed)
PULMONARY / CRITICAL CARE MEDICINE   Name: Terry [name] MRN: [id number] DOB: 12/27/1934    ADMISSION DATE:  06/07/2015 CONSULTATION DATE:  06/07/15  REFERRING MD:  Vanita Panda  CHIEF COMPLAINT:  Shortness of breath  HISTORY OF PRESENT ILLNESS:   80yo male with hx ESRD, AFib, COPD, CVA presented 12/16 to the ED with a 2 day history of progressive SOB and cough with yellow sputum. Had worsening respiratory status while at HD and EMS was called.PNA vent  SUBJECTIVE:  Peri HD, distress, hypoxia, BIPAP  VITAL SIGNS: BP 135/70 mmHg  Pulse 103  Temp(Src) 97.4 F (36.3 C) (Oral)  Resp 22  Ht 5\' 9"  (1.753 m)  Wt 88.3 kg (194 lb 10.7 oz)  BMI 28.73 kg/m2  SpO2 95%  HEMODYNAMICS:    VENTILATOR SETTINGS: Vent Mode:  [-] BIPAP FiO2 (%):  [50 %-100 %] 100 %  INTAKE / OUTPUT: I/O last 3 completed shifts: In: 1093 [I.V.:733; IV Piggyback:360] Out: [id number] [Urine:134; Other:3500; Chest Tube:400]  PHYSICAL EXAMINATION: General: chronically ill appearing male, on 100% nrb,mild distress Neuro: awake, Oriented CV:  s1s2 rrr, R chest perm cath, L chest tube  PULM: coarse GI: abd soft, non tender, +bs Extremities:  2+ BLE edema   LABS:  BMET  Recent Labs Lab 06/09/15 0600 06/10/15 0150 06/11/15 0550  NA 132* 133* 137  K 3.5 4.0 4.7  CL 96* 96* 98*  CO2 24 24 26   BUN 29* 61* 44*  CREATININE 4.01* 5.70* 4.39*  GLUCOSE 211* 137* 133*    Electrolytes  Recent Labs Lab 06/09/15 0600  06/09/15 2310 06/10/15 0150 06/10/15 1034 06/10/15 2255 06/11/15 0550  CALCIUM 8.4*  --   --  8.4*  --   --  8.9  MG  --   < > 2.3  --  2.4 2.3  --   PHOS 4.5  < > 5.2* 5.7* 6.2* 5.2*  --   < > = values in this interval not displayed.  CBC  Recent Labs Lab 06/09/15 0600 06/10/15 0150 06/11/15 0550  WBC 13.1* 13.6* 15.7*  HGB 8.4* 7.9* 9.3*  HCT 26.6* 25.8* 31.6*  PLT 308 313 346    Coag's  Recent Labs Lab 06/09/15 1620 06/10/15 0150 06/11/15 0550  INR 1.71* 1.61* 1.67*     Sepsis Markers  Recent Labs Lab 06/08/15 0307 06/08/15 0600  LATICACIDVEN 1.4 1.6  PROCALCITON 1.79  --     ABG  Recent Labs Lab 06/08/15 06/10/15 1419  PHART 7.365 7.385  PCO2ART 46.9* 47.5*  PO2ART 195.0* 64.0*    Liver Enzymes  Recent Labs Lab 06/08/15 0307 06/09/15 0600 06/10/15 0150  AST 27  --   --   ALT 13*  --   --   ALKPHOS 56  --   --   BILITOT 0.8  --   --   ALBUMIN 2.9* 2.6* 2.7*    Cardiac Enzymes  Recent Labs Lab 06/09/15 0600 06/09/15 1105 06/09/15 1620  TROPONINI 0.06* 0.16* 0.08*    Glucose  Recent Labs Lab 06/10/15 1158 06/10/15 1508 06/10/15 1955 06/10/15 2330 06/11/15 0340 06/11/15 0740  GLUCAP 163* 129* 108* 128* 112* 123*    Imaging Dg Chest Port 1 View  06/11/2015  CLINICAL DATA:  Chest tube, shortness of breath, acute respiratory failure, history of COPD, Coronary artery disease, sepsis, and pneumonia. EXAM: PORTABLE CHEST 1 VIEW COMPARISON:  Portable chest x-ray of June 10, 2015 FINDINGS: The lungs are adequately inflated. The left apical pneumothorax is not visible  today. There is persistent increased interstitial density within both lungs that is slightly less conspicuous today. The left-sided chest tube is unchanged in position overlying the posterior medial aspect of the sixth rib. There is a small left pleural effusion. The retrocardiac region on the left remains dense. The cardiac silhouette is enlarged but stable. The pulmonary vascularity is less prominent today. The dual-lumen dialysis type catheter tip projects over the junction of the middle and distal thirds of the SVC. IMPRESSION: 1. Slight interval improvement in bilateral interstitial infiltrates. Persistent left lower lobe atelectasis or pneumonia. Persistent trace left pleural effusion. The left apical pneumothorax is no longer evident. 2. Stable enlargement of the cardiac silhouette with decreased prominence of the pulmonary vascularity. 3. Stable  left-sided chest tube and right internal jugular dialysis catheter. Electronically Signed   By: David  Martinique M.D.   On: 06/11/2015 07:30     STUDIES:    CULTURES: Urine 12/16>>> no UOP BC x 2 12/16>>> Sputum 12/16>>>klieb, int ceftriaxone  ANTIBIOTICS: vanc 12/16>>>12/19 Cefepime 12/16>>>12/19 ceftaz 12/19>>>stop 12/23  SIGNIFICANT EVENTS: 12/16 intubated, L tension ptx post intubation, chest tube placed in ER  Extubated 12/19- worsening distress, HD, bipap  LINES/TUBES: ETT 12/16>>>12/18 R chest perm cath >>> L chest tube 12/16>>>  DISCUSSION: 80yo CM presented to ED, assessed for SOB. Intubated in ED for worsening respiratory distress and increased oxygen requirements. Treated for differential which includes HCAP, viral illness, COPD exacerbation, and acute on chronic CHF c/b inadequate volume removal with incomplete HD.   ASSESSMENT / PLAN:  PULMONARY A: Acute hypoxemic respiratory failure 2/2 HCAP vs COPD exacerbation vs. CHF exacerbation AECOPD  L PTX remains small remains P:   Chest tube to suction remain abg assessment May require reintubation Low threshold scheduled NIMV pcxr in am  Consider daily HD Max treatment copd exacerbation, to solumedrol 60 mg q12h Bders Re assess with repeat hd consider high flow O2 nasal cannula reservoir   CARDIOVASCULAR HTN  CAD  AFib on coumadin  Possible Acute on Chronic Diastolic heart failure, documented on Echo 02/03/13 P:  Home metoprolol increase to 50 mg, may need to reduce, re assess bp after hd coumadin Heparin gtt per pharmacy when INR <2 (currently 2.3), to overlap still as INR 1.6  RENAL A:  ESRD on HD P:   Hd today, agree daily would benefiot F/u chem   GASTROINTESTINAL A: r/o dysphagia P:   Slp, passed dysphagia diet PPI   HEMATOLOGIC A:  Anemia of chronic disease Leukocytosis hemoconcetartion P:  F/u CBC  Heparin gtt when INR subtherapeutic, coumadin  INFECTIOUS A:  Sepsis 2/2 HCAP P:    ceftaz Stop date added total 8 days  ENDOCRINE A:  No active issues P:   Monitor glucose on chem   NEUROLOGIC A:  Hx of CVA, sedation for mechanical ventilation P:   PT needed when resp better   FAMILY  - Updates: no family available 12/17 or 12/18 am, wife by me 12/20 - Inter-disciplinary family meet or Palliative Care meeting due by:  12/24  Ccm time 30 min    Lavon Paganini. Titus Mould, MD, [place] Pgr: [facility]

## 2015-06-11 NOTE — Evaluation (Signed)
Clinical/Bedside Swallow Evaluation Patient Details  Name: Terry [name] MRN: [id number] Date of Birth: March 06, 1935  Today's Date: 06/11/2015 Time: SLP Start Time (ACUTE ONLY): 0825 SLP Stop Time (ACUTE ONLY): 0838 SLP Time Calculation (min) (ACUTE ONLY): 13 min  Past Medical History:  Past Medical History  Diagnosis Date  . Stroke ([place]) 2012  . High cholesterol   . Hypertension   . Kidney disease     CKD4, sees [place] [facility], considering dialysis  . COPD (chronic obstructive pulmonary disease) ([place])   . CAD (coronary artery disease) [place], [name]  . Acute and chronic respiratory failure 10/22/2012  . Osteoarthritis 10/22/2012  . Pneumonia April 2014  . Atrial fibrillation [facility])    Past Surgical History:  Past Surgical History  Procedure Laterality Date  . Coronary stent placement  1999    2 stents  . Appendectomy  1984  . Colon surgery  2012    partial colon removed - twisted bowel  . Hernia repair      Right inguinal  . Cystoscopy/retrograde/ureteroscopy Bilateral 04/23/2014    Procedure: CYSTOSCOPY BILATERAL RETROGRADE,  LEFT URETEROSCOPY, LEFT STENT PLACEMENT;  Surgeon: Raynelle Bring, MD;  Location: [facility] ORS;  Service: Urology;  Laterality: Bilateral;  . Av fistula placement Right 05/21/2015    Procedure: Right Arm Brachiocephalic ARTERIOVENOUS (AV) FISTULA CREATION;  Surgeon: Angelia Mould, MD;  Location: [facility] OR;  Service: Vascular;  Laterality: Right;   HPI:  80yo male with hx ESRD, AFib, COPD, CVA presented 12/16 to the ED with a 2 day history of progressive SOB and cough with yellow sputum. Had worsening respiratory status while at HD and EMS was called. HCAp vs viral illness vs COPD vs CHF with incomplete volume removal, was intubated from 12/16-12/18.     Assessment / Plan / Recommendation Clinical Impression  pt is 48 hours s/p extubation ( 2 day intubation); demonstrates no signs of aspiration with trials of thin or puree foods. Did not trial regular  solids as dentures are en route, will be here around noon. Recommend pt initiate a dys 2 diet with thin liquids with basic aspiration precautions, given high O2 needs and hx of COPD. Reinforced need for fully upright posture. Will f/u x1 to assess with regular solids and check for toelrance given risk factors.     Aspiration Risk  Mild aspiration risk;Moderate aspiration risk    Diet Recommendation Dysphagia 2 (Fine chop);Thin liquid   Liquid Administration via: Cup;Straw Medication Administration: Whole meds with liquid Supervision: Staff to assist with self feeding Compensations: Small sips/bites;Slow rate Postural Changes: Seated upright at 90 degrees    Other  Recommendations Oral Care Recommendations: Oral care BID   Follow up Recommendations  None    Frequency and Duration min 1 x/week  1 week       Prognosis Prognosis for Safe Diet Advancement: Good      Swallow Study   General HPI: 80yo male with hx ESRD, AFib, COPD, CVA presented 12/16 to the ED with a 2 day history of progressive SOB and cough with yellow sputum. Had worsening respiratory status while at HD and EMS was called. HCAp vs viral illness vs COPD vs CHF with incomplete volume removal, was intubated from 12/16-12/18.   Type of Study: Bedside Swallow Evaluation Previous Swallow Assessment: none Diet Prior to this Study: NPO Temperature Spikes Noted: No Respiratory Status: Non-rebreather History of Recent Intubation: Yes Length of Intubations (days): 2 days Date extubated: 06/09/15 Behavior/Cognition: Alert Oral Cavity  Assessment: Dry Oral Care Completed by SLP: No Oral Cavity - Dentition: Dentures, not available Vision: Functional for self-feeding Self-Feeding Abilities: Needs assist (due to mask) Patient Positioning: Upright in bed Baseline Vocal Quality: Normal Volitional Cough: Strong Volitional Swallow: Able to elicit    Oral/Motor/Sensory Function Overall Oral Motor/Sensory Function: Within  functional limits   Ice Chips Ice chips: Within functional limits   Thin Liquid Thin Liquid: Within functional limits Presentation: Cup;Straw    Nectar Thick Nectar Thick Liquid: Not tested   Honey Thick Honey Thick Liquid: Not tested   Puree Puree: Within functional limits   Solid Solid: Not tested      [name], MA CCC-SLP [id number]  Everley Evora, Katherene Ponto 06/11/2015,8:56 AM

## 2015-06-11 NOTE — NC FL2 (Signed)
[place] LEVEL OF CARE SCREENING TOOL     IDENTIFICATION  Patient Name: Terry [name] Birthdate: [birth year] Sex: male Admission Date (Current Location): 06/07/2015  [facility] and [place] Number:  Herbalist and Address:  [facility]. [facility], [place] [address] [place],  [address]      Provider Number: [id number]  Attending Physician Name and Address:  Allie Bossier, MD  Relative Name and Phone Number:       Current Level of Care: Hospital Recommended Level of Care: [place] Prior Approval Number:    Date Approved/Denied:   PASRR Number: [id number]  Discharge Plan: SNF    Current Diagnoses: Patient Active Problem List   Diagnosis Date Noted  . Chest tube in place   . HCAP (healthcare-associated pneumonia) 06/07/2015  . Sepsis ([place]) 06/07/2015  . Acute hypoxemic respiratory failure ([place]) 06/07/2015  . Chronic atrial fibrillation ([place])   . ESRD (end stage renal disease) ([place])   . Acute respiratory failure with hypoxia ([place]) 05/15/2015  . Acute renal failure superimposed on stage 4 chronic kidney disease ([place]) 05/15/2015  . Acute diastolic CHF (congestive heart failure) ([place]) 05/15/2015  . Acute on chronic kidney failure ([place]) 05/14/2015  . CAD (coronary artery disease) 02/04/2015  . COPD exacerbation ([place]) 05/16/2014  . Hyperkalemia 04/13/2014  . COPD GOLD III 10/22/2012  . CKD (chronic kidney disease) stage 4, GFR 15-29 ml/min (HCC) 10/22/2012  . CAD (coronary artery disease) of artery bypass graft 10/22/2012  . Other and unspecified hyperlipidemia 10/22/2012  . Osteoarthritis 10/22/2012  . Essential hypertension 10/22/2012    Orientation RESPIRATION BLADDER Height & Weight    Self, Time, Situation, Place  Normal Continent 5\' 9"  (175.3 cm) 194 lbs.  BEHAVIORAL SYMPTOMS/MOOD NEUROLOGICAL BOWEL NUTRITION STATUS   (NONE)  (NONE) Continent Diet (DYS 2)  AMBULATORY STATUS COMMUNICATION OF NEEDS Skin    Limited Assist Verbally Normal                       Personal Care Assistance Level of Assistance  Bathing, Dressing Bathing Assistance: Limited assistance   Dressing Assistance: Limited assistance     Functional Limitations Info   (NONE)          [place]  PT (By licensed PT), OT (By licensed OT)     PT Frequency: 2 OT Frequency: 2            Contractures      Additional Factors Info  Code Status, Allergies Code Status Info: FULL CODE  Allergies Info: Yellow Dyes (Non-tartrazine), Levaquin           Current Medications (06/11/2015):  This is the current hospital active medication list Current Facility-Administered Medications  Medication Dose Route Frequency Provider Last Rate Last Dose  . 0.9 %  sodium chloride infusion  250 mL Intravenous PRN Bonnielee Haff, MD 10 mL/hr at 06/10/15 0547 250 mL at 06/10/15 0547  . 0.9 %  sodium chloride infusion  250 mL Intravenous PRN Luella Cook, MD   Stopped at 06/10/15 [phone]  . 0.9 %  sodium chloride infusion  100 mL Intravenous PRN Roney Jaffe, MD      . 0.9 %  sodium chloride infusion  100 mL Intravenous PRN Roney Jaffe, MD      . acetaminophen (TYLENOL) tablet 650 mg  650 mg Oral Q4H PRN Luella Cook, MD      . alteplase (CATHFLO ACTIVASE)  injection 2 mg  2 mg Intracatheter Once PRN Roney Jaffe, MD      . antiseptic oral rinse (CPC / CETYLPYRIDINIUM CHLORIDE 0.05%) solution 7 mL  7 mL Mouth Rinse q12n4p Mauri Brooklyn, MD   7 mL at 06/11/15 1244  . atorvastatin (LIPITOR) tablet 20 mg  20 mg Oral q1800 Bonnielee Haff, MD   20 mg at 06/10/15 1806  . cefTAZidime (FORTAZ) 2 g in dextrose 5 % 50 mL IVPB  2 g Intravenous Q M,W,F-HD [name], MD   2 g at 06/10/15 1221  . chlorhexidine (PERIDEX) 0.12 % solution 15 mL  15 mL Mouth Rinse BID Mauri Brooklyn, MD   15 mL at 06/11/15 0935  . clonazePAM (KLONOPIN) tablet 1 mg  1 mg Oral Daily PRN Bonnielee Haff, MD   1 mg at 06/10/15 1312   . [START ON 06/17/2015] Darbepoetin Alfa (ARANESP) injection 200 mcg  200 mcg Intravenous Q Mon-HD Mauricia Area, MD      . docusate sodium (COLACE) capsule 100 mg  100 mg Oral BID Roney Jaffe, MD   100 mg at 06/11/15 0931  . fentaNYL (SUBLIMAZE) injection 12.5-25 mcg  12.5-25 mcg Intravenous Q2H PRN Marijean Heath, NP   25 mcg at 06/10/15 0442  . ferric gluconate (NULECIT) 125 mg in sodium chloride 0.9 % 100 mL IVPB  125 mg Intravenous Q M,W,F-HD Roney Jaffe, MD   125 mg at 06/10/15 1720  . guaiFENesin (ROBITUSSIN) 100 MG/5ML solution 100 mg  5 mL Oral Q4H PRN Mauri Brooklyn, MD   100 mg at 06/10/15 0341  . heparin ADULT infusion 100 units/mL (25000 units/250 mL)  1,050 Units/hr Intravenous Continuous Darnell Level Mancheril, RPH 10.5 mL/hr at 06/11/15 0743 1,050 Units/hr at 06/11/15 0743  . heparin injection 1,000 Units  1,000 Units Dialysis PRN Roney Jaffe, MD      . heparin injection 2,000 Units  2,000 Units Dialysis PRN Roney Jaffe, MD      . insulin aspart (novoLOG) injection 0-15 Units  0-15 Units Subcutaneous 6 times per day Marijean Heath, NP   2 Units at 06/11/15 1243  . ipratropium-albuterol (DUONEB) 0.5-2.5 (3) MG/3ML nebulizer solution 3 mL  3 mL Nebulization Q6H Marijean Heath, NP   3 mL at 06/11/15 1445  . levalbuterol (XOPENEX) nebulizer solution 0.63 mg  0.63 mg Nebulization Q6H PRN Bonnielee Haff, MD      . lidocaine (PF) (XYLOCAINE) 1 % injection 5 mL  5 mL Intradermal PRN Roney Jaffe, MD      . lidocaine-prilocaine (EMLA) cream 1 application  1 application Topical PRN Roney Jaffe, MD      . methocarbamol (ROBAXIN) tablet 500 mg  500 mg Oral Q8H PRN Bonnielee Haff, MD   500 mg at 06/10/15 1312  . methylPREDNISolone sodium succinate (SOLU-MEDROL) 125 mg/2 mL injection 60 mg  60 mg Intravenous Q12H [name], MD      . metoprolol (LOPRESSOR) injection 2.5-5 mg  2.5-5 mg Intravenous Q3H PRN Erick Colace, NP   2.5 mg at 06/10/15 1540  .  metoprolol (LOPRESSOR) tablet 50 mg  50 mg Oral BID [name], MD   50 mg at 06/11/15 0931  . multivitamin (RENA-VIT) tablet 1 tablet  1 tablet Oral QHS Mauricia Area, MD   1 tablet at 06/10/15 2201  . pentafluoroprop-tetrafluoroeth (GEBAUERS) aerosol 1 application  1 application Topical PRN Roney Jaffe, MD      . polyethylene glycol (MIRALAX / GLYCOLAX) packet 17  g  17 g Oral Daily PRN Roney Jaffe, MD      . senna-docusate (Senokot-S) tablet 2 tablet  2 tablet Oral QHS PRN Bonnielee Haff, MD      . sodium chloride 0.9 % injection 3 mL  3 mL Intravenous Q12H Bonnielee Haff, MD   3 mL at 06/10/15 2206  . sodium chloride 0.9 % injection 3 mL  3 mL Intravenous Q12H Bonnielee Haff, MD   3 mL at 06/11/15 0936  . sodium chloride 0.9 % injection 3 mL  3 mL Intravenous PRN Bonnielee Haff, MD      . succinylcholine (ANECTINE) injection 100 mg  100 mg Intravenous Once Sherian Maroon, MD      . warfarin (COUMADIN) tablet 6 mg  6 mg Oral ONCE-1800 Allie Bossier, MD      . Warfarin - Pharmacist Dosing Inpatient   Does not apply q1800 Lavenia Atlas, RPH   0  at 06/10/15 1715     Discharge Medications: Please see discharge summary for a list of discharge medications.  Relevant Imaging Results:  Relevant Lab Results:   Additional Information SSN [id number]  Glendon Axe, MSW, LCSWA [phone] 06/11/2015 2:51 PM

## 2015-06-11 NOTE — Progress Notes (Signed)
Error   This encounter was created in error - please disregard. 

## 2015-06-12 ENCOUNTER — Inpatient Hospital Stay (HOSPITAL_COMMUNITY): Payer: Medicare PPO

## 2015-06-12 DIAGNOSIS — Z01818 Encounter for other preprocedural examination: Secondary | ICD-10-CM

## 2015-06-12 LAB — CBC
HEMATOCRIT: 26.8 % — AB (ref 39.0–52.0)
Hemoglobin: 8.2 g/dL — ABNORMAL LOW (ref 13.0–17.0)
MCH: 30.4 pg (ref 26.0–34.0)
MCHC: 30.6 g/dL (ref 30.0–36.0)
MCV: 99.3 fL (ref 78.0–100.0)
PLATELETS: 307 10*3/uL (ref 150–400)
RBC: 2.7 MIL/uL — AB (ref 4.22–5.81)
RDW: 20.3 % — ABNORMAL HIGH (ref 11.5–15.5)
WBC: 13.8 10*3/uL — AB (ref 4.0–10.5)

## 2015-06-12 LAB — RENAL FUNCTION PANEL
ALBUMIN: 2.7 g/dL — AB (ref 3.5–5.0)
ANION GAP: 15 (ref 5–15)
BUN: 102 mg/dL — AB (ref 6–20)
CALCIUM: 8 mg/dL — AB (ref 8.9–10.3)
CO2: 24 mmol/L (ref 22–32)
CREATININE: 7.21 mg/dL — AB (ref 0.61–1.24)
Chloride: 98 mmol/L — ABNORMAL LOW (ref 101–111)
GFR calc Af Amer: 7 mL/min — ABNORMAL LOW (ref >60)
GFR calc non Af Amer: 6 mL/min — ABNORMAL LOW (ref >60)
GLUCOSE: 147 mg/dL — AB (ref 65–99)
PHOSPHORUS: 9 mg/dL — AB (ref 2.5–4.6)
Potassium: 5.8 mmol/L — ABNORMAL HIGH (ref 3.5–5.1)
SODIUM: 137 mmol/L (ref 135–145)

## 2015-06-12 LAB — GLUCOSE, CAPILLARY
GLUCOSE-CAPILLARY: 132 mg/dL — AB (ref 65–99)
GLUCOSE-CAPILLARY: 134 mg/dL — AB (ref 65–99)
GLUCOSE-CAPILLARY: 181 mg/dL — AB (ref 65–99)
Glucose-Capillary: 172 mg/dL — ABNORMAL HIGH (ref 65–99)
Glucose-Capillary: 136 mg/dL — ABNORMAL HIGH (ref 65–99)
Glucose-Capillary: 102 mg/dL — ABNORMAL HIGH (ref 65–99)

## 2015-06-12 LAB — CULTURE, BLOOD (ROUTINE X 2)
CULTURE: NO GROWTH
CULTURE: NO GROWTH

## 2015-06-12 LAB — PROTIME-INR
INR: 2.42 — ABNORMAL HIGH (ref 0.00–1.49)
PROTHROMBIN TIME: 26.1 s — AB (ref 11.6–15.2)

## 2015-06-12 LAB — BODY FLUID CELL COUNT WITH DIFFERENTIAL
EOS FL: 0 %
LYMPHS FL: 3 %
MONOCYTE-MACROPHAGE-SEROUS FLUID: 2 % — AB (ref 50–90)
Neutrophil Count, Fluid: 95 % — ABNORMAL HIGH (ref 0–25)
Total Nucleated Cell Count, Fluid: UNDETERMINED cu mm (ref 0–1000)

## 2015-06-12 LAB — HEPARIN LEVEL (UNFRACTIONATED): Heparin Unfractionated: 0.43 IU/mL (ref 0.30–0.70)

## 2015-06-12 MED ORDER — HEPARIN SODIUM (PORCINE) 1000 UNIT/ML DIALYSIS: 1000.0000 [IU] | INTRAMUSCULAR | Status: DC | PRN

## 2015-06-12 MED ORDER — LIDOCAINE-PRILOCAINE 2.5-2.5 % EX CREA: 1.0000 "application " | TOPICAL_CREAM | CUTANEOUS | Status: DC | PRN

## 2015-06-12 MED ORDER — FENTANYL CITRATE (PF) 100 MCG/2ML IJ SOLN
INTRAMUSCULAR | Status: AC
  Administered 2015-06-12: 100 ug
  Filled 2015-06-12: qty 2

## 2015-06-12 MED ORDER — ALTEPLASE 2 MG IJ SOLR
2.0000 mg | Freq: Once | INTRAMUSCULAR | Status: DC | PRN
  Filled 2015-06-12: qty 2

## 2015-06-12 MED ORDER — SODIUM CHLORIDE 0.9 % IV SOLN: 100.0000 mL | INTRAVENOUS | Status: DC | PRN

## 2015-06-12 MED ORDER — NEPRO/CARBSTEADY PO LIQD
1000.0000 mL | ORAL | Status: DC
  Administered 2015-06-12 – 2015-06-15 (×5): 1000 mL via ORAL
  Filled 2015-06-12 (×6): qty 1000

## 2015-06-12 MED ORDER — MIDAZOLAM HCL 2 MG/2ML IJ SOLN
INTRAMUSCULAR | Status: AC
  Administered 2015-06-12: 2 mg
  Filled 2015-06-12: qty 2

## 2015-06-12 MED ORDER — HEPARIN SODIUM (PORCINE) 1000 UNIT/ML DIALYSIS
100.0000 [IU]/kg | INTRAMUSCULAR | Status: DC | PRN
  Administered 2015-06-12: 8800 [IU] via INTRAVENOUS_CENTRAL
  Filled 2015-06-12 (×2): qty 9

## 2015-06-12 MED ORDER — LIDOCAINE HCL (PF) 1 % IJ SOLN: 5.0000 mL | INTRAMUSCULAR | Status: DC | PRN

## 2015-06-12 MED ORDER — METHYLPREDNISOLONE SODIUM SUCC 40 MG IJ SOLR
40.0000 mg | Freq: Two times a day (BID) | INTRAMUSCULAR | Status: DC
  Administered 2015-06-12 – 2015-06-14 (×5): 40 mg via INTRAVENOUS
  Filled 2015-06-12 (×6): qty 1

## 2015-06-12 MED ORDER — PRO-STAT SUGAR FREE PO LIQD
30.0000 mL | Freq: Four times a day (QID) | ORAL | Status: DC
  Administered 2015-06-12 – 2015-06-16 (×15): 30 mL
  Filled 2015-06-12 (×19): qty 30

## 2015-06-12 MED ORDER — ETOMIDATE 2 MG/ML IV SOLN
40.0000 mg | Freq: Once | INTRAVENOUS | Status: AC
  Administered 2015-06-12: 40 mg via INTRAVENOUS

## 2015-06-12 MED ORDER — BISACODYL 10 MG RE SUPP
10.0000 mg | Freq: Once | RECTAL | Status: AC
  Administered 2015-06-12: 10 mg via RECTAL
  Filled 2015-06-12: qty 1

## 2015-06-12 MED ORDER — PENTAFLUOROPROP-TETRAFLUOROETH EX AERO: 1.0000 "application " | INHALATION_SPRAY | CUTANEOUS | Status: DC | PRN

## 2015-06-12 MED ORDER — SODIUM CHLORIDE 0.9 % IV BOLUS (SEPSIS)
500.0000 mL | Freq: Once | INTRAVENOUS | Status: AC
  Administered 2015-06-12: 500 mL via INTRAVENOUS

## 2015-06-12 MED ORDER — WARFARIN SODIUM 3 MG PO TABS
3.0000 mg | ORAL_TABLET | Freq: Once | ORAL | Status: DC
  Filled 2015-06-12: qty 1

## 2015-06-12 MED ORDER — CALCITRIOL 0.5 MCG PO CAPS
0.5000 ug | ORAL_CAPSULE | Freq: Every day | ORAL | Status: DC
  Administered 2015-06-12 – 2015-06-19 (×4): 0.5 ug via ORAL
  Filled 2015-06-12 (×8): qty 1

## 2015-06-12 NOTE — Progress Notes (Signed)
PULMONARY / CRITICAL CARE MEDICINE   Name: Terry [name] MRN: [id number] DOB: [birth year]    ADMISSION DATE:  06/07/2015 CONSULTATION DATE:  06/07/15  REFERRING MD:  Vanita Panda  CHIEF COMPLAINT:  Shortness of breath  HISTORY OF PRESENT ILLNESS:   80yo male with hx ESRD, AFib, COPD, CVA presented 12/16 to the ED with a 2 day history of progressive SOB and cough with yellow sputum. Had worsening respiratory status while at HD and EMS was called.PNA vent  SUBJECTIVE:  Remains 100%, small PTX remains, got BIPAP last night  VITAL SIGNS: BP 159/64 mmHg  Pulse 93  Temp(Src) 98.5 F (36.9 C) (Oral)  Resp 22  Ht 5\' 9"  (1.753 m)  Wt 85.5 kg (188 lb 7.9 oz)  BMI 27.82 kg/m2  SpO2 97%  HEMODYNAMICS:    VENTILATOR SETTINGS: Vent Mode:  [-] BIPAP FiO2 (%):  [70 %-100 %] 100 %  INTAKE / OUTPUT: I/O last 3 completed shifts: In: 993.4 [P.O.:240; I.V.:753.4] Out: 230 [Urine:30; Chest Tube:200]  PHYSICAL EXAMINATION: General: chronically ill appearing male, on 100% nrb,mild distress, baseline belly breathing Neuro: awake, Oriented CV:  s1s2 rrr, R chest perm cath, L chest tube  PULM: coarse, ronchi GI: abd soft, non tender, +bs Extremities:  2+ BLE edema   LABS:  BMET  Recent Labs Lab 06/09/15 0600 06/10/15 0150 06/11/15 0550  NA 132* 133* 137  K 3.5 4.0 4.7  CL 96* 96* 98*  CO2 24 24 26   BUN 29* 61* 44*  CREATININE 4.01* 5.70* 4.39*  GLUCOSE 211* 137* 133*    Electrolytes  Recent Labs Lab 06/09/15 0600  06/09/15 2310 06/10/15 0150 06/10/15 1034 06/10/15 2255 06/11/15 0550  CALCIUM 8.4*  --   --  8.4*  --   --  8.9  MG  --   < > 2.3  --  2.4 2.3  --   PHOS 4.5  < > 5.2* 5.7* 6.2* 5.2*  --   < > = values in this interval not displayed.  CBC  Recent Labs Lab 06/10/15 0150 06/11/15 0550 06/12/15 0346  WBC 13.6* 15.7* 13.8*  HGB 7.9* 9.3* 8.2*  HCT 25.8* 31.6* 26.8*  PLT 313 346 307    Coag's  Recent Labs Lab 06/10/15 0150 06/11/15 0550  06/12/15 0346  INR 1.61* 1.67* 2.42*    Sepsis Markers  Recent Labs Lab 06/08/15 0307 06/08/15 0600  LATICACIDVEN 1.4 1.6  PROCALCITON 1.79  --     ABG  Recent Labs Lab 06/08/15 06/10/15 1419 06/11/15 1255  PHART 7.365 7.385 7.342*  PCO2ART 46.9* 47.5* 48.9*  PO2ART 195.0* 64.0* 66.5*    Liver Enzymes  Recent Labs Lab 06/08/15 0307 06/09/15 0600 06/10/15 0150  AST 27  --   --   ALT 13*  --   --   ALKPHOS 56  --   --   BILITOT 0.8  --   --   ALBUMIN 2.9* 2.6* 2.7*    Cardiac Enzymes  Recent Labs Lab 06/09/15 0600 06/09/15 1105 06/09/15 1620  TROPONINI 0.06* 0.16* 0.08*    Glucose  Recent Labs Lab 06/11/15 0740 06/11/15 1111 06/11/15 1519 06/11/15 1937 06/11/15 2336 06/12/15 0425  GLUCAP 123* 123* 167* 164* 181* 172*    Imaging Dg Chest Port 1 View  06/12/2015  CLINICAL DATA:  Pulmonary edema. EXAM: PORTABLE CHEST 1 VIEW COMPARISON:  06/11/2015. FINDINGS: Right IJ sheath and left chest tube in stable position. Cardiomegaly with bilateral pulmonary interstitial prominence, no change from prior exam. Small  left pleural effusion. Tiny left apical pneumothorax again noted . IMPRESSION: 1.  Right IJ sheath and left chest tube in stable position. 2. Cardiomegaly with bilateral pulmonary interstitial prominence consistent congestive heart failure. No interim change from prior exam. Small left pleural effusion . 3.  Tiny left apical pneumothorax again noted. Critical Value/emergent results were called by telephone at the time of interpretation on 06/12/2015 at 7:21 am to nurse Shea Stakes, who verbally acknowledged these results. Electronically Signed   By: Marcello Moores  Register   On: 06/12/2015 07:19     STUDIES:    CULTURES: Urine 12/16>>> no UOP BC x 2 12/16>>> Sputum 12/16>>>klieb, int ceftriaxone  ANTIBIOTICS: vanc 12/16>>>12/19 Cefepime 12/16>>>12/19 ceftaz 12/19>>>stop 12/23  SIGNIFICANT EVENTS: 12/16 intubated, L tension ptx post intubation,  chest tube placed in ER  Extubated 12/19- worsening distress, HD, bipap, small PTX resolve dafter chest tube back to suction 12/20- weak resp status remains, failed high flow nasal cannula O2 reservoir   LINES/TUBES: ETT 12/16>>>12/18 R chest perm cath >>> L chest tube 12/16>>>  DISCUSSION: 80yo CM presented to ED, assessed for SOB. Intubated in ED for worsening respiratory distress and increased oxygen requirements. Treated for differential which includes HCAP, viral illness, COPD exacerbation, and acute on chronic CHF c/b inadequate volume removal with incomplete HD.   ASSESSMENT / PLAN:  PULMONARY A: Acute hypoxemic respiratory failure 2/2 HCAP vs COPD exacerbation vs. CHF exacerbation AECOPD  L PTX remains small remains Concern that pcxr does not explain hypoxia P:   Chest tube to suction remain May require reintubation pcxr in am  Consider daily HD, for today solumedral to 40 mg q12h consider CT chest r/o PE May need bipap, but would likely just place ett if worsen Add 6 liters under 100% if needed and wob wnl  CARDIOVASCULAR HTN  CAD  AFib on coumadin  Possible Acute on Chronic Diastolic heart failure, documented on Echo 02/03/13 P:  Home metoprolol, re assess BP post HD Coumadin maintain  RENAL A:  ESRD on HD P:   Hd today, can we do daily? F/u chem   GASTROINTESTINAL A: r/o dysphagia P:   Slp, passed dysphagia diet PPI   HEMATOLOGIC A:  Anemia of chronic disease Leukocytosis hemoconcetartion P:  F/u CBC  Heparin gtt when INR subtherapeutic, coumadin  INFECTIOUS A:  Sepsis 2/2 HCAP P:   ceftaz to stop date CT chest  ENDOCRINE A:  No active issues P:   Monitor glucose on chem   NEUROLOGIC A:  Hx of CVA, sedation for mechanical ventilation P:   PT needed when resp better   FAMILY  - Updates: no family available 12/17 or 12/18 am, wife by me 12/20 - Inter-disciplinary family meet or Palliative Care meeting due by:  12/24  Ccm time 30  min    Lavon Paganini. Titus Mould, MD, [place] Pgr: [facility]

## 2015-06-12 NOTE — Procedures (Signed)
OGT placed by me  i used glide and directed into esophagus  No resistance Placed  .Lavon Paganini. Titus Mould, MD, [place] Pgr: [place] [facility]

## 2015-06-12 NOTE — Procedures (Signed)
Bronchoscopy Procedure Note JAJUAN EWALT [id number] Aug 10, 1934  Procedure: Bronchoscopy Indications: Diagnostic evaluation of the airways, Obtain specimens for culture and/or other diagnostic studies and Remove secretions  Procedure Details Consent: Unable to obtain consent because of emergent medical necessity. Time Out: Verified patient identification, verified procedure, site/side was marked, verified correct patient position, special equipment/implants available, medications/allergies/relevent history reviewed, required imaging and test results available.  Performed  In preparation for procedure, patient was given 100% FiO2 and bronchoscope lubricated. Sedation: Etomidate  Airway entered and the following bronchi were examined: RUL, RML, RLL, LUL, LLL and Bronchi.   Procedures performed: Brushings performed - no Patient placed back on 100% FiO2 at conclusion of procedure.    Evaluation Hemodynamic Status: BP stable throughout; O2 sats: stable throughout Patient's Current Condition: stable Specimens:  Sent serosanguinous fluid - bloody Complications: No apparent complications Patient did tolerate procedure well.   [name]. 06/12/2015  Post intubation, difficult to bag, obtsrcuted tube?? 1. Obstructed ett with thick skin like bloody material ,scutioned eventually 2. Bloody clots bilateral rt greater left, obstructing BI, RUL,, cleared 3. NO DAH 4. Aspiration appearance mucosa rt 5. BAL , bloody  Lavon Paganini. Titus Mould, MD, [place] Pgr: [facility]

## 2015-06-12 NOTE — Procedures (Signed)
Bedside Bronchoscopy Procedure Note Terry [name] [id number] 1935/02/25  Procedure: Bronchoscopy Indications: Diagnostic evaluation of the airways  Procedure Details: ET Tube Size: ET Tube secured at lip (cm): Bite block in place: Yes In preparation for procedure, Patient hyper-oxygenated with 100 % FiO2 Airway entered and the following bronchi were examined: RUL, RML, RLL, LUL and LLL.   Bronchoscope removed.    Evaluation BP 95/50 mmHg  Pulse 77  Temp(Src) 98.6 F (37 C) (Oral)  Resp 16  Ht 5\' 9"  (1.753 m)  Wt 188 lb 7.9 oz (85.5 kg)  BMI 27.82 kg/m2  SpO2 100% Breath Sounds:Rhonch O2 sats: stable throughout Patient's Current Condition: stable Specimens:  Sent serosanguinous fluid Complications: No apparent complications Patient did tolerate procedure well.   Mcneil Sober 06/12/2015, 1:16 PM

## 2015-06-12 NOTE — Procedures (Signed)
I was present at this session.  I have reviewed the session itself and made appropriate changes.  Hd via PC  , now on vent lower vol.    Delisia Mcquiston,Rayjon L 12/21/20163:50 PM

## 2015-06-12 NOTE — Procedures (Signed)
Intubation Procedure Note Terry [name] [id number] 11-11-1934  Procedure: Intubation Indications: Respiratory insufficiency  Procedure Details Consent: Risks of procedure as well as the alternatives and risks of each were explained to the (patient/caregiver).  Consent for procedure obtained. Time Out: Verified patient identification, verified procedure, site/side was marked, verified correct patient position, special equipment/implants available, medications/allergies/relevent history reviewed, required imaging and test results available.  Performed  Maximum sterile technique was used including gown, hand hygiene and mask.  MAC and 4    Evaluation Hemodynamic Status: BP stable throughout; O2 sats: stable throughout Patient's Current Condition: stable Complications: No apparent complications Patient did tolerate procedure well. Chest X-ray ordered to verify placement.  CXR: pending.   [name] 06/12/2015

## 2015-06-12 NOTE — Progress Notes (Signed)
PT Cancellation Note  Patient Details Name: Terry [name] MRN: [id number] DOB: 02-14-1935   Cancelled Treatment:    Reason Eval/Treat Not Completed: Medical issues which prohibited therapy pt currently desaturating into low 80s and about to be intubated. Not appropriate for therapy at this time. Will follow up.   Marguarite Arbour A Declan Mier 06/12/2015, 10:55 AM  Wray Kearns, PT, DPT [phone]

## 2015-06-12 NOTE — Progress Notes (Signed)
Arrived to pt room 48M-03 at 1525.  Reviewed treatment plan and this RN agrees with plan.  Report received from bedside RN, Shea Stakes.  Consent verified.  Patient sedated, intubated.   Lung sounds coarse/ronchii to ausculation in all fields. No edema. Cardiac:  Regular R&R, tachycardic at times.  Removed caps and cleansed RIJ catheter with chlorhedxidine.  Aspirated ports of heparin and flushed them with saline per protocol.  Connected and secured lines, initiated treatment at 1558.  UF Goal of 4526mL and net fluid removal 4L.  Will continue to monitor.

## 2015-06-12 NOTE — Progress Notes (Signed)
Subjective: Interval History: has no complaint, thinks is a a little better.  Objective: Vital signs in last 24 hours: Temp:  [97.4 F (36.3 C)-98.5 F (36.9 C)] 98.5 F (36.9 C) (12/21 0427) Pulse Rate:  [93-116] 105 (12/21 0500) Resp:  [14-28] 22 (12/21 0500) BP: (123-171)/(53-80) 160/71 mmHg (12/21 0500) SpO2:  [86 %-99 %] 90 % (12/21 0500) FiO2 (%):  [70 %-100 %] 100 % (12/21 0216) Weight:  [85.5 kg (188 lb 7.9 oz)] 85.5 kg (188 lb 7.9 oz) (12/21 0500) Weight change: 1.1 kg (2 lb 6.8 oz)  Intake/Output from previous day: 12/20 0701 - 12/21 0700 In: 708.4 [P.O.:240; I.V.:468.4] Out: 100 [Chest Tube:100] Intake/Output this shift:    General appearance: alert, cooperative, moderately obese, pale and mild dyspnea Resp: diminished breath sounds bilaterally, rales bilaterally and rhonchi bilaterally Chest wall: RIJ cath Cardio: regularly irregular rhythm and systolic murmur: holosystolic 2/6, blowing at apex GI: obese, pos bs,.liver down5 cm Extremities: edema 1+ and AVF RUA  Lab Results:  Recent Labs  06/11/15 0550 06/12/15 0346  WBC 15.7* 13.8*  HGB 9.3* 8.2*  HCT 31.6* 26.8*  PLT 346 307   BMET:  Recent Labs  06/10/15 0150 06/11/15 0550  NA 133* 137  K 4.0 4.7  CL 96* 98*  CO2 24 26  GLUCOSE 137* 133*  BUN 61* 44*  CREATININE 5.70* 4.39*  CALCIUM 8.4* 8.9   No results for input(s): PTH in the last 72 hours. Iron Studies: No results for input(s): IRON, TIBC, TRANSFERRIN, FERRITIN in the last 72 hours.  Studies/Results: Dg Chest Port 1 View  06/11/2015  CLINICAL DATA:  Chest tube, shortness of breath, acute respiratory failure, history of COPD, Coronary artery disease, sepsis, and pneumonia. EXAM: PORTABLE CHEST 1 VIEW COMPARISON:  Portable chest x-ray of June 10, 2015 FINDINGS: The lungs are adequately inflated. The left apical pneumothorax is not visible today. There is persistent increased interstitial density within both lungs that is slightly less  conspicuous today. The left-sided chest tube is unchanged in position overlying the posterior medial aspect of the sixth rib. There is a small left pleural effusion. The retrocardiac region on the left remains dense. The cardiac silhouette is enlarged but stable. The pulmonary vascularity is less prominent today. The dual-lumen dialysis type catheter tip projects over the junction of the middle and distal thirds of the SVC. IMPRESSION: 1. Slight interval improvement in bilateral interstitial infiltrates. Persistent left lower lobe atelectasis or pneumonia. Persistent trace left pleural effusion. The left apical pneumothorax is no longer evident. 2. Stable enlargement of the cardiac silhouette with decreased prominence of the pulmonary vascularity. 3. Stable left-sided chest tube and right internal jugular dialysis catheter. Electronically Signed   By: David  Martinique M.D.   On: 06/11/2015 07:30   Dg Chest Port 1 View  06/10/2015  CLINICAL DATA:  Left pneumothorax and chest tube. Shortness of breath. EXAM: PORTABLE CHEST 1 VIEW COMPARISON:  06/09/2015 FINDINGS: Left chest tube remains in place. Small left mid thorax noted. Left lower lobe atelectasis or infiltrate. Patchy opacities in the right mid and lower lung have increased since prior study. Heart is upper limits normal in size. Interval extubation.  Right dialysis catheter is unchanged. IMPRESSION: Stable left chest tube position.  Small left apical pneumothorax. Stable left lower lobe atelectasis or infiltrate. Patchy opacities in the right mid and lower lung have increased. Electronically Signed   By: Rolm Baptise M.D.   On: 06/10/2015 11:05    I have reviewed the patient's  current medications.  Assessment/Plan: 1 ESRD will do HD next 2d, lower solute and vol.  Hopefully help resp status 2 Severe COPD per CCM 3 Anemia slowly lower 4 Afib rate controlled 5 HPTH 6 HcAP 7 CAd P HD qd, esa, Fe, vit D    LOS: 5 days   Bodi Palmeri,Kahlil  L 06/12/2015,7:02 AM

## 2015-06-12 NOTE — Progress Notes (Signed)
Nutrition Follow-up  DOCUMENTATION CODES:   Not applicable  INTERVENTION:    Initiate TF via OGT with Nepro at goal rate of 30 ml/h (720 ml per day) and Prostat 30 ml QID to provide 1696 kcals, 118 gm protein, 523 ml free water daily.  NUTRITION DIAGNOSIS:   Inadequate oral intake related to inability to eat as evidenced by NPO status.  Ongoing  GOAL:   Patient will meet greater than or equal to 90% of their needs  Unmet  MONITOR:   Vent status, Labs, Weight trends, TF tolerance  REASON FOR ASSESSMENT:   Consult Enteral/tube feeding initiation and management  ASSESSMENT:   80 y/o male PMhx Stroke, HTN, ESRD-newly on HD, CHF, COPD, CAD, Acute/chronic resp failure, PNA, A FIB who presents after having worsening SOB w/ cough. Was only able to tolerate 1 hr of HD yesterday. Transported from dialysis to ED and subsequently intubated for resp failure. Was hospitalized 11/21-12/13 w/ AoC RENAL failure and was started on HD at that time. This admission he is worked up for HCAP.   Discussed patient in ICU rounds and with RN today. Patient required re-intubation this AM; MD placed OGT today. RD to order TF per discussion in ICU rounds. Patient continues to receive HD.   Patient is currently intubated on ventilator support MV: 8.7 L/min Temp (24hrs), Avg:98.3 F (36.8 C), Min:97.7 F (36.5 C), Max:98.6 F (37 C)   Diet Order:  NPO  Skin:  Reviewed, no issues  Last BM:  PTA  Height:   Ht Readings from Last 1 Encounters:  06/08/15 5\' 9"  (1.753 m)    Weight:   Wt Readings from Last 1 Encounters:  06/12/15 188 lb 7.9 oz (85.5 kg)    Ideal Body Weight:  72.73 kg  BMI:  Body mass index is 27.82 kg/(m^2).  Estimated Nutritional Needs:   Kcal:  [id number]  Protein:  110-125 gm  Fluid:  1.2 L  EDUCATION NEEDS:   No education needs identified at this time  Molli Barrows, [place], [place], [place] Pager [phone] After Hours Pager [phone]

## 2015-06-12 NOTE — Procedures (Signed)
Intubation Procedure Note Terry [name] [id number] [birth year]  Procedure: Intubation Indications: Airway protection and maintenance  Procedure Details Consent: Risks of procedure as well as the alternatives and risks of each were explained to the (patient/caregiver).  Consent for procedure obtained. Time Out: Verified patient identification, verified procedure, site/side was marked, verified correct patient position, special equipment/implants available, medications/allergies/relevent history reviewed, required imaging and test results available.  Performed  Maximum sterile technique was used including gloves, gown, hand hygiene and mask.  MAC and 4    Evaluation Hemodynamic Status: BP stable throughout; O2 sats: stable throughout Patient's Current Condition: stable Complications: No apparent complications Patient did tolerate procedure well. Chest X-ray ordered to verify placement.  CXR: tube position acceptable.   Mcneil Sober 06/12/2015

## 2015-06-12 NOTE — Progress Notes (Signed)
Speech Language Pathology Treatment: Dysphagia  Patient Details Name: Terry [name] MRN: [id number] DOB: [birth year] Today's Date: 06/12/2015 Time: [id number] SLP Time Calculation (min) (ACUTE ONLY): 25 min  Assessment / Plan / Recommendation Clinical Impression  Pt demonstrates signs of probable silent aspiration of thin liquids today given increased work of breathing, increased RR and consistent late cough with am meal. SLP cued pt to take small sips and pace PO intake with use of mask, but function did not improve. At end of session SLP provided slow careful total assist with pudding with no dealyed coughin observed. Pt is not safe to take liquids at this time until respiratory function improves. Pt would benefit from objective test, but he would not tolerate testing today. Recommend pt consume a dys 2 (fine chop) diet with no liquids (pudding thick), ice chips allowed. SLP discussed with pt and RN. Will f/u tomorrow for further testing.    HPI HPI: 80yo male with hx ESRD, AFib, COPD, CVA presented 12/16 to the ED with a 2 day history of progressive SOB and cough with yellow sputum. Had worsening respiratory status while at HD and EMS was called. HCAp vs viral illness vs COPD vs CHF with incomplete volume removal, was intubated from 12/16-12/18.        SLP Plan  Continue with current plan of care;MBS     Recommendations  Diet recommendations: Pudding-thick liquid;Dysphagia 2 (fine chop) Medication Administration: Whole meds with puree Supervision: Staff to assist with self feeding Compensations: Slow rate Postural Changes and/or Swallow Maneuvers: Seated upright 90 degrees              Plan: Continue with current plan of care;MBS  [name], MA CCC-SLP [phone]  Lynann Beaver 06/12/2015, 9:27 AM

## 2015-06-12 NOTE — Progress Notes (Signed)
Utilization review complete. Kynlea Blackston RN CCM Case Mgmt phone [phone] 

## 2015-06-12 NOTE — Progress Notes (Signed)
ANTICOAGULATION CONSULT NOTE - Follow Up  Pharmacy Consult for Coumadin Indication: Afib   Allergies  Allergen Reactions  . Yellow Dyes (Non-Tartrazine) Other (See Comments)    Burns arms Cough medications (tussionex and benzonatate ok)  . Levaquin [Levofloxacin In D5w] Itching    Patient Measurements: Height: 5\' 9"  (175.3 cm) Weight: 188 lb 7.9 oz (85.5 kg) IBW/kg (Calculated) : 70.7  Vital Signs: Temp: 98.5 F (36.9 C) (12/21 0427) Temp Source: Oral (12/21 0427) BP: 159/64 mmHg (12/21 0800) Pulse Rate: 93 (12/21 0800) Intake/Output from previous day: 12/20 0701 - 12/21 0700 In: 749.4 [P.O.:240; I.V.:509.4] Out: 100 [Chest Tube:100] Intake/Output from this shift: Total I/O In: 20.5 [I.V.:20.5] Out: -   Labs:  Recent Labs  06/10/15 0150 06/11/15 0550 06/12/15 0346  WBC 13.6* 15.7* 13.8*  HGB 7.9* 9.3* 8.2*  PLT 313 346 307  CREATININE 5.70* 4.39*  --    Estimated Creatinine Clearance: 15 mL/min (by C-G formula based on Cr of 4.39).    Medical History: Past Medical History  Diagnosis Date  . Stroke ([place]) 2012  . High cholesterol   . Hypertension   . Kidney disease     CKD4, sees [place] [facility], considering dialysis  . COPD (chronic obstructive pulmonary disease) ([place])   . CAD (coronary artery disease) [place], [name]  . Acute and chronic respiratory failure 10/22/2012  . Osteoarthritis 10/22/2012  . Pneumonia April 2014  . Atrial fibrillation (HCC)     Medications:  Prescriptions prior to admission  Medication Sig Dispense Refill Last Dose  . albuterol (VENTOLIN HFA) 108 (90 BASE) MCG/ACT inhaler Inhale 2 puffs into the lungs every 4 (four) hours as needed for wheezing or shortness of breath. 1 Inhaler 0 PRN at Unknown time  . amLODipine (NORVASC) 10 MG tablet Take 1 tablet (10 mg total) by mouth daily. 90 tablet 3 06/07/2015 at Unknown time  . atorvastatin (LIPITOR) 20 MG tablet Take 1 tablet (20 mg total) by mouth daily. (Patient taking  differently: Take 20 mg by mouth daily at 6 PM. ) 30 tablet 6 06/06/2015 at Unknown time  . benzonatate (TESSALON) 200 MG capsule Take 1 capsule (200 mg total) by mouth 3 (three) times daily as needed for cough. 60 capsule 0 06/07/2015 at Unknown time  . budesonide-formoterol (SYMBICORT) 160-4.5 MCG/ACT inhaler Inhale 2 puffs into the lungs 2 (two) times daily. 1 Inhaler 6 06/07/2015 at Unknown time  . clonazePAM (KLONOPIN) 1 MG tablet Take 1 tablet (1 mg total) by mouth daily as needed for anxiety (for HD treatment). 30 tablet 0 06/07/2015 at Unknown time  . guaiFENesin-dextromethorphan (ROBITUSSIN DM) 100-10 MG/5ML syrup Take 5 mLs by mouth every 4 (four) hours as needed for cough. 118 mL 0 06/07/2015 at Unknown time  . HYDROcodone-acetaminophen (NORCO/VICODIN) 5-325 MG tablet Take 1 tablet by mouth every 6 (six) hours as needed for moderate pain or severe pain. 30 tablet 0 4+5 days  . ipratropium-albuterol (DUONEB) 0.5-2.5 (3) MG/3ML SOLN Take 3 mLs by nebulization 3 (three) times daily. 360 mL 3 06/06/2015 at Unknown time  . magic mouthwash SOLN Take 5 mLs by mouth 4 (four) times daily. X 10days 15 mL 1 06/06/2015 at Unknown time  . methocarbamol (ROBAXIN) 500 MG tablet Take 1 tablet (500 mg total) by mouth every 8 (eight) hours as needed for muscle spasms. 90 tablet 1 06/05/2015  . metoprolol tartrate (LOPRESSOR) 25 MG tablet Take 1 tablet (25 mg total) by mouth 2 (two) times daily. Uncertain  tablet 3 06/06/2015 at pm  . midodrine (PROAMATINE) 10 MG tablet Take 1 tablet (10 mg total) by mouth 3 (three) times daily with meals. 90 tablet 3 06/07/2015 at Unknown time  . polyethylene glycol (MIRALAX) packet Take 17 g by mouth daily as needed for moderate constipation. 30 each 0 PRN  . senna-docusate (SENOKOT S) 8.6-50 MG tablet Take 2 tablets by mouth at bedtime as needed for mild constipation. 60 tablet 3 PRN  . tiotropium (SPIRIVA HANDIHALER) 18 MCG inhalation capsule INHALE THE CONTENTS OF ONE CAPSULE INTO  LUNGS ONCE DAILY (Patient taking differently: Place 18 mcg into inhaler and inhale daily. ) 30 capsule 3 06/07/2015 at Unknown time  . warfarin (COUMADIN) 4 MG tablet Take 1 tablet (4 mg total) by mouth daily at 6 PM. Dose needs to be adjusted by your doctor 30 tablet 0 06/06/2015 at 700 pm   Assessment: 21 yom with ESRD. New HD patient MWF. Coumadin held at admission and resumed 12/19 with heparin bridge. INR therapeutic 1.67>2.42; heparin discontinued. H/H slight decrease today 8.2, 26.8. Plt wnl. No bleeding reported.   Home Coumadin dose: 4 mg daily    Goal of Therapy:  INR 2-3   Plan:  Coumadin 3 mg x 1 dose today  Monitor CBC, s/sx beeding and clinical progress   Stephens November, PharmD Clinical Pharmacy Resident 06/12/2015 8:45AM

## 2015-06-12 NOTE — Progress Notes (Signed)
Dialysis treatment completed.  3000 mL ultrafiltrated.  2500 mL net fluid removal.  Patient status unchanged. Lung sounds coarse to ausculation in all fields. Generalized edema. Cardiac: Tachycardic.  Cleansed RIJ catheter with chlorhexidine.  Disconnected lines and flushed ports with saline per protocol.  Ports locked with heparin and capped per protocol.    Report given to bedside, RN Robin.

## 2015-06-13 ENCOUNTER — Inpatient Hospital Stay (HOSPITAL_COMMUNITY): Payer: Medicare PPO

## 2015-06-13 LAB — CBC WITH DIFFERENTIAL/PLATELET
BASOS ABS: 0 10*3/uL (ref 0.0–0.1)
BASOS PCT: 0 %
EOS ABS: 0 10*3/uL (ref 0.0–0.7)
Eosinophils Relative: 0 %
HCT: 26.6 % — ABNORMAL LOW (ref 39.0–52.0)
HEMOGLOBIN: 8.1 g/dL — AB (ref 13.0–17.0)
LYMPHS PCT: 6 %
Lymphs Abs: 0.8 10*3/uL (ref 0.7–4.0)
MCH: 30.1 pg (ref 26.0–34.0)
MCHC: 30.5 g/dL (ref 30.0–36.0)
MCV: 98.9 fL (ref 78.0–100.0)
Monocytes Absolute: 0.4 10*3/uL (ref 0.1–1.0)
Monocytes Relative: 3 %
NEUTROS PCT: 91 %
Neutro Abs: 12 10*3/uL — ABNORMAL HIGH (ref 1.7–7.7)
Platelets: 278 10*3/uL (ref 150–400)
RBC: 2.69 MIL/uL — ABNORMAL LOW (ref 4.22–5.81)
RDW: 21.1 % — ABNORMAL HIGH (ref 11.5–15.5)
WBC: 13.2 10*3/uL — ABNORMAL HIGH (ref 4.0–10.5)

## 2015-06-13 LAB — RENAL FUNCTION PANEL
ALBUMIN: 2.7 g/dL — AB (ref 3.5–5.0)
Anion gap: 13 (ref 5–15)
BUN: 64 mg/dL — AB (ref 6–20)
CALCIUM: 8.3 mg/dL — AB (ref 8.9–10.3)
CO2: 28 mmol/L (ref 22–32)
Chloride: 97 mmol/L — ABNORMAL LOW (ref 101–111)
Creatinine, Ser: 4.32 mg/dL — ABNORMAL HIGH (ref 0.61–1.24)
GFR calc Af Amer: 14 mL/min — ABNORMAL LOW (ref >60)
GFR calc non Af Amer: 12 mL/min — ABNORMAL LOW (ref >60)
GLUCOSE: 200 mg/dL — AB (ref 65–99)
PHOSPHORUS: 5.5 mg/dL — AB (ref 2.5–4.6)
Potassium: 4.2 mmol/L (ref 3.5–5.1)
SODIUM: 138 mmol/L (ref 135–145)

## 2015-06-13 LAB — GLUCOSE, CAPILLARY
GLUCOSE-CAPILLARY: 146 mg/dL — AB (ref 65–99)
Glucose-Capillary: 127 mg/dL — ABNORMAL HIGH (ref 65–99)
Glucose-Capillary: 131 mg/dL — ABNORMAL HIGH (ref 65–99)
Glucose-Capillary: 137 mg/dL — ABNORMAL HIGH (ref 65–99)
Glucose-Capillary: 149 mg/dL — ABNORMAL HIGH (ref 65–99)
Glucose-Capillary: 169 mg/dL — ABNORMAL HIGH (ref 65–99)

## 2015-06-13 LAB — PROTIME-INR
INR: 2.22 — ABNORMAL HIGH (ref 0.00–1.49)
PROTHROMBIN TIME: 24.4 s — AB (ref 11.6–15.2)

## 2015-06-13 LAB — CULTURE, BLOOD (ROUTINE X 2)
Culture: NO GROWTH
Culture: NO GROWTH

## 2015-06-13 LAB — PATHOLOGIST SMEAR REVIEW

## 2015-06-13 MED ORDER — CHLORHEXIDINE GLUCONATE 0.12% ORAL RINSE (MEDLINE KIT)
15.0000 mL | Freq: Two times a day (BID) | OROMUCOSAL | Status: DC
  Administered 2015-06-13 – 2015-06-14 (×5): 15 mL via OROMUCOSAL

## 2015-06-13 MED ORDER — PANTOPRAZOLE SODIUM 40 MG PO PACK
40.0000 mg | PACK | Freq: Every day | ORAL | Status: DC
  Administered 2015-06-13 – 2015-06-15 (×3): 40 mg
  Filled 2015-06-13 (×5): qty 20

## 2015-06-13 MED ORDER — IOHEXOL 350 MG/ML SOLN
80.0000 mL | Freq: Once | INTRAVENOUS | Status: AC | PRN
  Administered 2015-06-13: 80 mL via INTRAVENOUS

## 2015-06-13 MED ORDER — ANTISEPTIC ORAL RINSE SOLUTION (CORINZ)
7.0000 mL | Freq: Four times a day (QID) | OROMUCOSAL | Status: DC
  Administered 2015-06-13 – 2015-06-14 (×5): 7 mL via OROMUCOSAL

## 2015-06-13 MED ORDER — DEXTROSE 5 % IV SOLN
2.0000 g | Freq: Once | INTRAVENOUS | Status: AC
  Administered 2015-06-13: 2 g via INTRAVENOUS
  Filled 2015-06-13: qty 2

## 2015-06-13 NOTE — Progress Notes (Signed)
RT Note: Pt transported to CT & back to 55M with no complications. RT will continue to monitor.

## 2015-06-13 NOTE — Progress Notes (Signed)
SLP Cancellation Note  Patient Details Name: Terry [name] MRN: [id number] DOB: [birth year]   Cancelled treatment:       Reason Eval/Treat Not Completed: Medical issues which prohibited therapy. Pt intubated. Will sign off and await new orders.    Adaiah Jaskot, Katherene Ponto 06/13/2015, 7:35 AM

## 2015-06-13 NOTE — Progress Notes (Signed)
ANTIBIOTIC CONSULT NOTE - FOLLOW UP  Pharmacy Consult for Ceftazidime Indication: Pneumonia  Allergies  Allergen Reactions  . Yellow Dyes (Non-Tartrazine) Other (See Comments)    Burns arms Cough medications (tussionex and benzonatate ok)  . Levaquin [Levofloxacin In D5w] Itching    Patient Measurements: Height: 5\' 9"  (175.3 cm) Weight: 183 lb 6.8 oz (83.2 kg) IBW/kg (Calculated) : 70.7  Vital Signs: Temp: 97.5 F (36.4 C) (12/22 1542) Temp Source: Oral (12/22 1542) BP: 93/35 mmHg (12/22 1635) Pulse Rate: 114 (12/22 1635) Intake/Output from previous day: 12/21 0701 - 12/22 0700 In: 1028.9 [I.V.:148.9; NG/GT:330; IV Piggyback:50] Out: 2700 [Emesis/NG output:200] Intake/Output from this shift: Total I/O In: 270 [NG/GT:270] Out: -   Labs:  Recent Labs  06/11/15 0550 06/12/15 0346 06/12/15 1550 06/13/15 0227 06/13/15 1338  WBC 15.7* 13.8*  --  13.2*  --   HGB 9.3* 8.2*  --  8.1*  --   PLT 346 307  --  278  --   CREATININE 4.39*  --  7.21*  --  4.32*   Estimated Creatinine Clearance: 14.1 mL/min (by C-G formula based on Cr of 4.32). No results for input(s): VANCOTROUGH, VANCOPEAK, VANCORANDOM, GENTTROUGH, GENTPEAK, GENTRANDOM, TOBRATROUGH, TOBRAPEAK, TOBRARND, AMIKACINPEAK, AMIKACINTROU, AMIKACIN in the last 72 hours.   Microbiology: Recent Results (from the past 720 hour(s))  Surgical pcr screen     Status: Abnormal   Collection Time: 05/21/15  5:09 AM  Result Value Ref Range Status   MRSA, PCR POSITIVE (A) NEGATIVE Final   Staphylococcus aureus POSITIVE (A) NEGATIVE Final    Comment:        The Xpert SA Assay (FDA approved for NASAL specimens in patients over 28 years of age), is one component of a comprehensive surveillance program.  Test performance has been validated by [facility] for patients greater than or equal to 14 year old. It is not intended to diagnose infection nor to guide or monitor treatment.   Culture, Urine     Status: None   Collection Time: 06/02/15  2:59 PM  Result Value Ref Range Status   Specimen Description URINE, CATHETERIZED  Final   Special Requests zosyn and vancomycin  Final   Culture NO GROWTH 1 DAY  Final   Report Status 06/03/2015 FINAL  Final  Culture, blood (routine x 2)     Status: None   Collection Time: 06/07/15  9:29 PM  Result Value Ref Range Status   Specimen Description BLOOD LEFT FOREARM  Final   Special Requests BOTTLES DRAWN AEROBIC ONLY 6CCS  Final   Culture NO GROWTH 5 DAYS  Final   Report Status 06/12/2015 FINAL  Final  Culture, blood (routine x 2)     Status: None   Collection Time: 06/07/15  9:38 PM  Result Value Ref Range Status   Specimen Description BLOOD LEFT HAND  Final   Special Requests BOTTLES DRAWN AEROBIC ONLY 4CCS  Final   Culture NO GROWTH 5 DAYS  Final   Report Status 06/12/2015 FINAL  Final  Culture, respiratory (tracheal aspirate)     Status: None   Collection Time: 06/08/15  2:19 AM  Result Value Ref Range Status   Specimen Description TRACHEAL ASPIRATE  Final   Special Requests Normal  Final   Gram Stain   Final    NO WBC SEEN NO SQUAMOUS EPITHELIAL CELLS SEEN NO ORGANISMS SEEN Performed at [facility]    Culture   Final    FEW KLEBSIELLA OXYTOCA Performed at [facility]    Report Status 06/10/2015 FINAL  Final   Organism ID, Bacteria KLEBSIELLA OXYTOCA  Final      Susceptibility   Klebsiella oxytoca - MIC*    AMPICILLIN >=32 RESISTANT Resistant     AMPICILLIN/SULBACTAM >=32 RESISTANT Resistant     CEFAZOLIN >=64 RESISTANT Resistant     CEFEPIME <=1 SENSITIVE Sensitive     CEFTAZIDIME <=1 SENSITIVE Sensitive     CEFTRIAXONE 2 INTERMEDIATE Intermediate     CIPROFLOXACIN <=0.25 SENSITIVE Sensitive     GENTAMICIN <=1 SENSITIVE Sensitive     IMIPENEM <=0.25 SENSITIVE Sensitive     PIP/TAZO >=128 RESISTANT Resistant     TOBRAMYCIN <=1 SENSITIVE Sensitive     TRIMETH/SULFA Value in next row Sensitive      <=20 SENSITIVE(NOTE)     * FEW KLEBSIELLA OXYTOCA  Culture, blood (x 2)     Status: None   Collection Time: 06/08/15  2:57 AM  Result Value Ref Range Status   Specimen Description BLOOD LEFT HAND  Final   Special Requests IN PEDIATRIC BOTTLE 2ML  Final   Culture NO GROWTH 5 DAYS  Final   Report Status 06/13/2015 FINAL  Final  Culture, blood (x 2)     Status: None   Collection Time: 06/08/15  3:07 AM  Result Value Ref Range Status   Specimen Description BLOOD LEFT HAND  Final   Special Requests IN PEDIATRIC BOTTLE 1 ML  Final   Culture NO GROWTH 5 DAYS  Final   Report Status 06/13/2015 FINAL  Final  Culture, respiratory (NON-Expectorated)     Status: None (Preliminary result)   Collection Time: 06/12/15  3:03 PM  Result Value Ref Range Status   Specimen Description BRONCHIAL ALVEOLAR LAVAGE  Final   Special Requests NONE  Final   Gram Stain   Final    ABUNDANT WBC PRESENT, PREDOMINANTLY PMN NO SQUAMOUS EPITHELIAL CELLS SEEN NO ORGANISMS SEEN Performed at [facility]    Culture   Final    NO GROWTH 1 DAY Performed at [facility]    Report Status PENDING  Incomplete    Medical History: Past Medical History  Diagnosis Date  . Stroke ([place]) 2012  . High cholesterol   . Hypertension   . Kidney disease     CKD4, sees [place] [facility], considering dialysis  . COPD (chronic obstructive pulmonary disease) ([place])   . CAD (coronary artery disease) [place], [name]  . Acute and chronic respiratory failure 10/22/2012  . Osteoarthritis 10/22/2012  . Pneumonia April 2014  . Atrial fibrillation (HCC)     Medications:  Prescriptions prior to admission  Medication Sig Dispense Refill Last Dose  . albuterol (VENTOLIN HFA) 108 (90 BASE) MCG/ACT inhaler Inhale 2 puffs into the lungs every 4 (four) hours as needed for wheezing or shortness of breath. 1 Inhaler 0 PRN at Unknown time  . amLODipine (NORVASC) 10 MG tablet Take 1 tablet (10 mg total) by mouth daily. 90 tablet 3 06/07/2015 at  Unknown time  . atorvastatin (LIPITOR) 20 MG tablet Take 1 tablet (20 mg total) by mouth daily. (Patient taking differently: Take 20 mg by mouth daily at 6 PM. ) 30 tablet 6 06/06/2015 at Unknown time  . benzonatate (TESSALON) 200 MG capsule Take 1 capsule (200 mg total) by mouth 3 (three) times daily as needed for cough. 60 capsule 0 06/07/2015 at Unknown time  . budesonide-formoterol (SYMBICORT) 160-4.5 MCG/ACT inhaler Inhale 2 puffs into the  lungs 2 (two) times daily. 1 Inhaler 6 06/07/2015 at Unknown time  . clonazePAM (KLONOPIN) 1 MG tablet Take 1 tablet (1 mg total) by mouth daily as needed for anxiety (for HD treatment). 30 tablet 0 06/07/2015 at Unknown time  . guaiFENesin-dextromethorphan (ROBITUSSIN DM) 100-10 MG/5ML syrup Take 5 mLs by mouth every 4 (four) hours as needed for cough. 118 mL 0 06/07/2015 at Unknown time  . HYDROcodone-acetaminophen (NORCO/VICODIN) 5-325 MG tablet Take 1 tablet by mouth every 6 (six) hours as needed for moderate pain or severe pain. 30 tablet 0 4+5 days  . ipratropium-albuterol (DUONEB) 0.5-2.5 (3) MG/3ML SOLN Take 3 mLs by nebulization 3 (three) times daily. 360 mL 3 06/06/2015 at Unknown time  . magic mouthwash SOLN Take 5 mLs by mouth 4 (four) times daily. X 10days 15 mL 1 06/06/2015 at Unknown time  . methocarbamol (ROBAXIN) 500 MG tablet Take 1 tablet (500 mg total) by mouth every 8 (eight) hours as needed for muscle spasms. 90 tablet 1 06/05/2015  . metoprolol tartrate (LOPRESSOR) 25 MG tablet Take 1 tablet (25 mg total) by mouth 2 (two) times daily. 60 tablet 3 06/06/2015 at pm  . midodrine (PROAMATINE) 10 MG tablet Take 1 tablet (10 mg total) by mouth 3 (three) times daily with meals. 90 tablet 3 06/07/2015 at Unknown time  . polyethylene glycol (MIRALAX) packet Take 17 g by mouth daily as needed for moderate constipation. 30 each 0 PRN  . senna-docusate (SENOKOT S) 8.6-50 MG tablet Take 2 tablets by mouth at bedtime as needed for mild constipation. 60  tablet 3 PRN  . tiotropium (SPIRIVA HANDIHALER) 18 MCG inhalation capsule INHALE THE CONTENTS OF ONE CAPSULE INTO LUNGS ONCE DAILY (Patient taking differently: Place 18 mcg into inhaler and inhale daily. ) 30 capsule 3 06/07/2015 at Unknown time  . warfarin (COUMADIN) 4 MG tablet Take 1 tablet (4 mg total) by mouth daily at 6 PM. Dose needs to be adjusted by your doctor 30 tablet 0 06/06/2015 at 700 pm   Assessment: 38 yom with ESRD. New HD patient MWF. Presents from HD with SOB. Currently on ceftazidime for pneumonia. WBC 13.2. Some concern for necrotizing pneumonia. Pt tolerated full additional HD session this afternoon. Will order ceftaz dose for post-HD today. WBC 13.8 (down), Afeb.  Vancomycin 12/16 > 12/19 Cefepime 12/16 > 12/19 Ceftazidime 12/19>>  12/17: Trach asp>>klebsiella oxytoca (Int to ceftriaxone)  Goal of Therapy:  Resolution of infection    Plan:  -Ceftaz 2g IV x 1 dose post-HD for additional session today -Ceftazidime 2 gm IV Q MWF with HD -Monitor CBC, cultures and clinical progress -F/u CT to r/o necrotizing pneumonia   Elicia Lamp, PharmD, [facility] Clinical Pharmacist Pager [phone] 06/13/2015 4:46 PM

## 2015-06-13 NOTE — Progress Notes (Signed)
Utilization review completed.  

## 2015-06-13 NOTE — Progress Notes (Signed)
Dr Lamonte Sakai notified pt sbp 100s, completed full HD session, due for scheduled lopressor, will give med per md

## 2015-06-13 NOTE — Progress Notes (Signed)
This rn notes order for ct to water seal, most recent md note states ct to suction. Will leave ct to water seal per Dr Alva Garnet

## 2015-06-13 NOTE — Progress Notes (Signed)
Per Dr. Justin Mend in Nephrology it is okay to give patient IV contrast for his CT angio chest.

## 2015-06-13 NOTE — Procedures (Signed)
I have seen and examined this patient and agree with the plan of care . Patient tolerating dialysis  [facility] W 06/13/2015, 2:10 PM

## 2015-06-13 NOTE — Progress Notes (Signed)
Occupational Therapy Treatment Patient Details Name: Terry [name] MRN: [id number] DOB: 1935-03-16 Today's Date: 06/13/2015    History of present illness pt is a 80 y/o male with h/o stroke, HTN, COPD, CAD, afib and OA, admitted with 2 day h/o SOB with condition deteriorating in HD.12/21 on endotrachial tube   OT comments  This 80 yo male admitted with above seen today for 1st tx since eval--pt overall looked better, breathing better; calmer/not as anxious today; however remains at same level as on eval. Pt now with ETT with Dr. Titus Mould clearing therapy to work with pt on sitting EOB. He will continue to benefit from acute OT with follow up at SNF to get back to PLOF.  Follow Up Recommendations  SNF          Precautions / Restrictions Precautions Precautions: Fall Restrictions Weight Bearing Restrictions: No       Mobility Bed Mobility Overal bed mobility: Needs Assistance;+2 for physical assistance Bed Mobility: Supine to Sit;Sit to Supine     Supine to sit: Mod assist;HOB elevated Sit to supine: Mod assist;+2 for physical assistance           Balance Overall balance assessment: Needs assistance Sitting-balance support: Single extremity supported;Feet supported Sitting balance-Leahy Scale: Fair Sitting balance - Comments: pt sat EOB for about 5 mins, mostly without UE assist                                           Cognition   Behavior During Therapy: WFL for tasks assessed/performed (much calmer today that on eval) Overall Cognitive Status: Within Functional Limits for tasks assessed                                    Pertinent Vitals/ Pain       Pain Assessment: Faces Faces Pain Scale: Hurts little more Pain Location: pt pointed to throat Pain Descriptors / Indicators: Sore (from ETT) Pain Intervention(s): Monitored during session         Frequency Min 2X/week     Progress Toward Goals  OT Goals(current goals can now  be found in the care plan section)  Progress towards OT goals:  (remains same as on eval, however much more calmer)     Plan Discharge plan remains appropriate       End of Session Equipment Utilized During Treatment:  (pt now with ETT)   Activity Tolerance Patient tolerated treatment well   Patient Left in bed;with call bell/phone within reach (diaylsis nurse in to start pt on HD)   Nurse Communication          Time: [id number] OT Time Calculation (min): 20 min  Charges: OT General Charges $OT Visit: 1 Procedure OT Treatments $Self Care/Home Management : 8-22 mins  Almon Register [phone] 06/13/2015, 1:30 PM

## 2015-06-13 NOTE — Clinical Social Work Note (Signed)
CSW contacted patient's wife with bed offers, patient's wife will review and make decision on where she would like patient to go to.  CSW to continue to follow patient's progress.  Jones Broom. [place], MSW, [place] 06/13/2015 5:03 PM

## 2015-06-13 NOTE — Progress Notes (Signed)
ANTIBIOTIC & ANTICOAGULATION CONSULT NOTE - FOLLOW UP  Pharmacy Consult for Ceftazidime Indication: Pneumonia  Allergies  Allergen Reactions  . Yellow Dyes (Non-Tartrazine) Other (See Comments)    Burns arms Cough medications (tussionex and benzonatate ok)  . Levaquin [Levofloxacin In D5w] Itching    Patient Measurements: Height: 5\' 9"  (175.3 cm) Weight: 183 lb 6.8 oz (83.2 kg) IBW/kg (Calculated) : 70.7  Vital Signs: Temp: 99.6 F (37.6 C) (12/22 0745) Temp Source: Oral (12/22 0745) BP: 136/60 mmHg (12/22 1100) Pulse Rate: 108 (12/22 1100) Intake/Output from previous day: 12/21 0701 - 12/22 0700 In: 1028.9 [I.V.:148.9; NG/GT:330; IV Piggyback:50] Out: 2700 [Emesis/NG output:200] Intake/Output from this shift: Total I/O In: 120 [NG/GT:120] Out: -   Labs:  Recent Labs  06/11/15 0550 06/12/15 0346 06/12/15 1550 06/13/15 0227  WBC 15.7* 13.8*  --  13.2*  HGB 9.3* 8.2*  --  8.1*  PLT 346 307  --  278  CREATININE 4.39*  --  7.21*  --    Estimated Creatinine Clearance: 8.4 mL/min (by C-G formula based on Cr of 7.21). No results for input(s): VANCOTROUGH, VANCOPEAK, VANCORANDOM, GENTTROUGH, GENTPEAK, GENTRANDOM, TOBRATROUGH, TOBRAPEAK, TOBRARND, AMIKACINPEAK, AMIKACINTROU, AMIKACIN in the last 72 hours.   Microbiology: Recent Results (from the past 720 hour(s))  Surgical pcr screen     Status: Abnormal   Collection Time: 05/21/15  5:09 AM  Result Value Ref Range Status   MRSA, PCR POSITIVE (A) NEGATIVE Final   Staphylococcus aureus POSITIVE (A) NEGATIVE Final    Comment:        The Xpert SA Assay (FDA approved for NASAL specimens in patients over 51 years of age), is one component of a comprehensive surveillance program.  Test performance has been validated by [facility] for patients greater than or equal to 39 year old. It is not intended to diagnose infection nor to guide or monitor treatment.   Culture, Urine     Status: None   Collection Time:  06/02/15  2:59 PM  Result Value Ref Range Status   Specimen Description URINE, CATHETERIZED  Final   Special Requests zosyn and vancomycin  Final   Culture NO GROWTH 1 DAY  Final   Report Status 06/03/2015 FINAL  Final  Culture, blood (routine x 2)     Status: None   Collection Time: 06/07/15  9:29 PM  Result Value Ref Range Status   Specimen Description BLOOD LEFT FOREARM  Final   Special Requests BOTTLES DRAWN AEROBIC ONLY 6CCS  Final   Culture NO GROWTH 5 DAYS  Final   Report Status 06/12/2015 FINAL  Final  Culture, blood (routine x 2)     Status: None   Collection Time: 06/07/15  9:38 PM  Result Value Ref Range Status   Specimen Description BLOOD LEFT HAND  Final   Special Requests BOTTLES DRAWN AEROBIC ONLY 4CCS  Final   Culture NO GROWTH 5 DAYS  Final   Report Status 06/12/2015 FINAL  Final  Culture, respiratory (tracheal aspirate)     Status: None   Collection Time: 06/08/15  2:19 AM  Result Value Ref Range Status   Specimen Description TRACHEAL ASPIRATE  Final   Special Requests Normal  Final   Gram Stain   Final    NO WBC SEEN NO SQUAMOUS EPITHELIAL CELLS SEEN NO ORGANISMS SEEN Performed at [facility]    Culture   Final    FEW KLEBSIELLA OXYTOCA Performed at [facility]    Report Status 06/10/2015 FINAL  Final   Organism ID, Bacteria KLEBSIELLA OXYTOCA  Final      Susceptibility   Klebsiella oxytoca - MIC*    AMPICILLIN >=32 RESISTANT Resistant     AMPICILLIN/SULBACTAM >=32 RESISTANT Resistant     CEFAZOLIN >=64 RESISTANT Resistant     CEFEPIME <=1 SENSITIVE Sensitive     CEFTAZIDIME <=1 SENSITIVE Sensitive     CEFTRIAXONE 2 INTERMEDIATE Intermediate     CIPROFLOXACIN <=0.25 SENSITIVE Sensitive     GENTAMICIN <=1 SENSITIVE Sensitive     IMIPENEM <=0.25 SENSITIVE Sensitive     PIP/TAZO >=128 RESISTANT Resistant     TOBRAMYCIN <=1 SENSITIVE Sensitive     TRIMETH/SULFA Value in next row Sensitive      <=20 SENSITIVE(NOTE)    * FEW  KLEBSIELLA OXYTOCA  Culture, blood (x 2)     Status: None (Preliminary result)   Collection Time: 06/08/15  2:57 AM  Result Value Ref Range Status   Specimen Description BLOOD LEFT HAND  Final   Special Requests IN PEDIATRIC BOTTLE 2ML  Final   Culture NO GROWTH 4 DAYS  Final   Report Status PENDING  Incomplete  Culture, blood (x 2)     Status: None (Preliminary result)   Collection Time: 06/08/15  3:07 AM  Result Value Ref Range Status   Specimen Description BLOOD LEFT HAND  Final   Special Requests IN PEDIATRIC BOTTLE 1 ML  Final   Culture NO GROWTH 4 DAYS  Final   Report Status PENDING  Incomplete  Culture, respiratory (NON-Expectorated)     Status: None (Preliminary result)   Collection Time: 06/12/15  3:03 PM  Result Value Ref Range Status   Specimen Description BRONCHIAL ALVEOLAR LAVAGE  Final   Special Requests NONE  Final   Gram Stain   Final    ABUNDANT WBC PRESENT, PREDOMINANTLY PMN NO SQUAMOUS EPITHELIAL CELLS SEEN NO ORGANISMS SEEN Performed at [facility]    Culture   Final    NO GROWTH 1 DAY Performed at [facility]    Report Status PENDING  Incomplete    Medical History: Past Medical History  Diagnosis Date  . Stroke ([place]) 2012  . High cholesterol   . Hypertension   . Kidney disease     CKD4, sees [place] [facility], considering dialysis  . COPD (chronic obstructive pulmonary disease) ([place])   . CAD (coronary artery disease) [place], [name]  . Acute and chronic respiratory failure 10/22/2012  . Osteoarthritis 10/22/2012  . Pneumonia April 2014  . Atrial fibrillation ([facility])     Medications:  Prescriptions prior to admission  Medication Sig Dispense Refill Last Dose  . albuterol (VENTOLIN HFA) 108 (90 BASE) MCG/ACT inhaler Inhale 2 puffs into the lungs every 4 (four) hours as needed for wheezing or shortness of breath. 1 Inhaler 0 PRN at Unknown time  . amLODipine (NORVASC) 10 MG tablet Take 1 tablet (10 mg total) by mouth daily. 90  tablet 3 06/07/2015 at Unknown time  . atorvastatin (LIPITOR) 20 MG tablet Take 1 tablet (20 mg total) by mouth daily. (Patient taking differently: Take 20 mg by mouth daily at 6 PM. ) 30 tablet 6 06/06/2015 at Unknown time  . benzonatate (TESSALON) 200 MG capsule Take 1 capsule (200 mg total) by mouth 3 (three) times daily as needed for cough. 60 capsule 0 06/07/2015 at Unknown time  . budesonide-formoterol (SYMBICORT) 160-4.5 MCG/ACT inhaler Inhale 2 puffs into the lungs 2 (two) times daily. 1 Inhaler 6  06/07/2015 at Unknown time  . clonazePAM (KLONOPIN) 1 MG tablet Take 1 tablet (1 mg total) by mouth daily as needed for anxiety (for HD treatment). 30 tablet 0 06/07/2015 at Unknown time  . guaiFENesin-dextromethorphan (ROBITUSSIN DM) 100-10 MG/5ML syrup Take 5 mLs by mouth every 4 (four) hours as needed for cough. 118 mL 0 06/07/2015 at Unknown time  . HYDROcodone-acetaminophen (NORCO/VICODIN) 5-325 MG tablet Take 1 tablet by mouth every 6 (six) hours as needed for moderate pain or severe pain. 30 tablet 0 4+5 days  . ipratropium-albuterol (DUONEB) 0.5-2.5 (3) MG/3ML SOLN Take 3 mLs by nebulization 3 (three) times daily. 360 mL 3 06/06/2015 at Unknown time  . magic mouthwash SOLN Take 5 mLs by mouth 4 (four) times daily. X 10days 15 mL 1 06/06/2015 at Unknown time  . methocarbamol (ROBAXIN) 500 MG tablet Take 1 tablet (500 mg total) by mouth every 8 (eight) hours as needed for muscle spasms. 90 tablet 1 06/05/2015  . metoprolol tartrate (LOPRESSOR) 25 MG tablet Take 1 tablet (25 mg total) by mouth 2 (two) times daily. 60 tablet 3 06/06/2015 at pm  . midodrine (PROAMATINE) 10 MG tablet Take 1 tablet (10 mg total) by mouth 3 (three) times daily with meals. 90 tablet 3 06/07/2015 at Unknown time  . polyethylene glycol (MIRALAX) packet Take 17 g by mouth daily as needed for moderate constipation. 30 each 0 PRN  . senna-docusate (SENOKOT S) 8.6-50 MG tablet Take 2 tablets by mouth at bedtime as needed for  mild constipation. 60 tablet 3 PRN  . tiotropium (SPIRIVA HANDIHALER) 18 MCG inhalation capsule INHALE THE CONTENTS OF ONE CAPSULE INTO LUNGS ONCE DAILY (Patient taking differently: Place 18 mcg into inhaler and inhale daily. ) 30 capsule 3 06/07/2015 at Unknown time  . warfarin (COUMADIN) 4 MG tablet Take 1 tablet (4 mg total) by mouth daily at 6 PM. Dose needs to be adjusted by your doctor 30 tablet 0 06/06/2015 at 700 pm   Assessment: 8 yom with ESRD. New HD patient MWF. Presents from HD with SOB. Currently on ceftazidime for pneumonia. WBC 13.2. Patient will likely get another HD today. Some concern for necrotizing pneumonia.   Vancomycin 12/16 > 12/19 Cefepime 12/16 > 12/19 Ceftazidime 12/19>>  12/17: Trach asp>>klebsiella oxytoca (Int to ceftriaxone)  Goal of Therapy:  Resolution of infection    Plan:  -Ceftazidime 2 gm IV Q MWF with HD -Additional dose of Ceftazidime today IF HD is scheduled -Monitor CBC, cultures and clinical progress -F/u CT to r/o necrotizing pneumonia    Albertina Parr, PharmD., BCPS Clinical Pharmacist Pager [phone]

## 2015-06-13 NOTE — Progress Notes (Signed)
PULMONARY / CRITICAL CARE MEDICINE   Name: Terry [name] MRN: [id number] DOB: 29-Aug-1934    ADMISSION DATE:  06/07/2015 CONSULTATION DATE:  06/07/15  REFERRING MD:  Vanita Panda  CHIEF COMPLAINT:  Shortness of breath  HISTORY OF PRESENT ILLNESS:   80yo male with hx ESRD, AFib, COPD, CVA presented 12/16 to the ED with a 2 day history of progressive SOB and cough with yellow sputum. Had worsening respiratory status while at HD and EMS was called.PNA vent  SUBJECTIVE:  Weaning on CPAP. Follows commands.   VITAL SIGNS: BP 136/62 mmHg  Pulse 109  Temp(Src) 99.6 F (37.6 C) (Oral)  Resp 14  Ht 5\' 9"  (1.753 m)  Wt 183 lb 6.8 oz (83.2 kg)  BMI 27.07 kg/m2  SpO2 93%  HEMODYNAMICS:    VENTILATOR SETTINGS: Vent Mode:  [-] CPAP;PSV FiO2 (%):  [40 %-100 %] 40 % Set Rate:  [16 bmp] 16 bmp Vt Set:  [550 mL] 550 mL PEEP:  [5 cmH20] 5 cmH20 Pressure Support:  [10 cmH20] 10 cmH20 Plateau Pressure:  [14 cmH20-21 cmH20] 14 cmH20  INTAKE / OUTPUT: I/O last 3 completed shifts: In: 1280.9 [I.V.:400.9; Other:500; NG/GT:330; IV Piggyback:50] Out: 2800 [Emesis/NG output:200; Other:2500; Chest Tube:100]  PHYSICAL EXAMINATION: General: chronically ill appearing man, on CPAP, NAD Neuro: awake, follows commands, moves all extremities  CV:  RRR, no m/g/r, R chest perm cath, L chest tube  PULM: coarse, rhonchi GI: BS+, soft, non tender Extremities:  1+ BLE edema   LABS:  BMET  Recent Labs Lab 06/10/15 0150 06/11/15 0550 06/12/15 1550  NA 133* 137 137  K 4.0 4.7 5.8*  CL 96* 98* 98*  CO2 24 26 24   BUN 61* 44* 102*  CREATININE 5.70* 4.39* 7.21*  GLUCOSE 137* 133* 147*    Electrolytes  Recent Labs Lab 06/09/15 2310 06/10/15 0150 06/10/15 1034 06/10/15 2255 06/11/15 0550 06/12/15 1550  CALCIUM  --  8.4*  --   --  8.9 8.0*  MG 2.3  --  2.4 2.3  --   --   PHOS 5.2* 5.7* 6.2* 5.2*  --  9.0*    CBC  Recent Labs Lab 06/11/15 0550 06/12/15 0346 06/13/15 0227  WBC  15.7* 13.8* 13.2*  HGB 9.3* 8.2* 8.1*  HCT 31.6* 26.8* 26.6*  PLT 346 307 278    Coag's  Recent Labs Lab 06/11/15 0550 06/12/15 0346 06/13/15 0227  INR 1.67* 2.42* 2.22*    Sepsis Markers  Recent Labs Lab 06/08/15 0307 06/08/15 0600  LATICACIDVEN 1.4 1.6  PROCALCITON 1.79  --     ABG  Recent Labs Lab 06/08/15 06/10/15 1419 06/11/15 1255  PHART 7.365 7.385 7.342*  PCO2ART 46.9* 47.5* 48.9*  PO2ART 195.0* 64.0* 66.5*    Liver Enzymes  Recent Labs Lab 06/08/15 0307 06/09/15 0600 06/10/15 0150 06/12/15 1550  AST 27  --   --   --   ALT 13*  --   --   --   ALKPHOS 56  --   --   --   BILITOT 0.8  --   --   --   ALBUMIN 2.9* 2.6* 2.7* 2.7*    Cardiac Enzymes  Recent Labs Lab 06/09/15 0600 06/09/15 1105 06/09/15 1620  TROPONINI 0.06* 0.16* 0.08*    Glucose  Recent Labs Lab 06/12/15 1210 06/12/15 1518 06/12/15 1951 06/12/15 2335 06/13/15 0350 06/13/15 0743  GLUCAP 132* 136* 102* 131* 137* 146*    Imaging Dg Chest Port 1 View  06/13/2015  CLINICAL  DATA:  80 year old male with acute respiratory failure, pneumonia. COPD, end-stage renal disease. Initial encounter. EXAM: PORTABLE CHEST 1 VIEW COMPARISON:  06/12/2015 and earlier. FINDINGS: Portable AP semi upright view at 0505 hours. Intubated. Endotracheal tube tip now projects about 15 mm above the carina. Enteric tube courses to the abdomen, tip not included. Stable right IJ approach dual lumen dialysis type catheter. Stable left chest tube. Further improved left lung base ventilation. Residual reticulonodular opacity at the left lung, and in the right mid and lower lung. No pneumothorax, pulmonary edema, or pleural effusion identified. IMPRESSION: 1. Endotracheal tube tip about 15 mm above the carina. Otherwise stable lines and tubes. 2. Bilateral multifocal pneumonia suspected. Interval improved left lung base ventilation. No pneumothorax, edema, or effusion identified. Electronically Signed   By: Genevie Ann M.D.   On: 06/13/2015 07:25   Dg Chest Port 1 View  06/12/2015  CLINICAL DATA:  Status post endotracheal tube placement EXAM: PORTABLE CHEST - 1 VIEW COMPARISON:  06/12/2015 FINDINGS: Endotracheal tube is now been placed in lies approximately 4 cm above the carina. Nasogastric catheter is noted extending into the stomach. A left-sided chest tube and dialysis catheter are again noted and stable. Patchy density is again noted throughout the right lung and left base. IMPRESSION: Status post endotracheal tube and nasogastric catheter placement in satisfactory position. The remainder of the exam is stable from the previous study. Electronically Signed   By: Inez Catalina M.D.   On: 06/12/2015 12:21   Dg Abd Portable 1v  06/12/2015  CLINICAL DATA:  Orogastric tube placement EXAM: PORTABLE ABDOMEN - 1 VIEW COMPARISON:  None. FINDINGS: Orogastric tube tip and side port are in the stomach. Bowel gas pattern is unremarkable. There is consolidation in the medial left lung base. IMPRESSION: Orogastric tube tip and side port in stomach. Bowel gas pattern unremarkable. Consolidation medial left lung base. Electronically Signed   By: Lowella Grip III M.D.   On: 06/12/2015 12:22     STUDIES:    CULTURES: Urine 12/16>>> no UOP BC x 2 12/16>>> Sputum 12/16>>>kleb, int ceftriaxone  ANTIBIOTICS: vanc 12/16>>>12/19 Cefepime 12/16>>>12/19 ceftaz 12/19>>>stop 12/23  SIGNIFICANT EVENTS: 12/16 intubated, L tension ptx post intubation, chest tube placed in ER  Extubated 12/19- worsening distress, HD, bipap, small PTX resolve dafter chest tube back to suction 12/20- weak resp status remains, failed high flow nasal cannula O2 reservoir  12/21- required intubation, bronch w/ bloody clots, no DAH  LINES/TUBES: ETT 12/16>>>12/18 R chest perm cath >>> L chest tube 12/16>>>  DISCUSSION: 80yo CM presented to ED, assessed for SOB. Intubated in ED for worsening respiratory distress and increased oxygen  requirements. Treated for differential which includes HCAP, viral illness, COPD exacerbation, and acute on chronic CHF c/b inadequate volume removal with incomplete HD.   ASSESSMENT / PLAN:  PULMONARY A: Acute hypoxemic respiratory failure 2/2 HCAP vs COPD exacerbation vs. CHF exacerbation AECOPD  L PTX- resolved Concern that pcxr does not explain hypoxia Klebsiella PNA - necrotizing? P:   Weaning on CPAP ps 10 required Chest tube to suction remain pcxr w/ bilateral multifocal PNA solumedrol 40 mg q12h  consider CT chest  CARDIOVASCULAR HTN  CAD  AFib on coumadin  Possible Acute on Chronic Diastolic heart failure, documented on Echo 02/03/13 P:  Home metoprolol Coumadin on hold due to bloody resp secretions   RENAL A:  ESRD on HD P:   Chem in AM HD today, would want daily Renal following   GASTROINTESTINAL A: r/o  dysphagia- passed SLP eval P:   TF PPI   HEMATOLOGIC A:  Anemia of chronic disease Leukocytosis hemoconcetartion bleeding on bronch P:  CBC in AM Heparin/coumadin on hold due to blood clots found on bronch  INFECTIOUS A:  Sepsis 2/2 HCAP, r/;o necrotizing process P:   ceftaz to stop date CT chest Klebsiella growing in resp cx  ENDOCRINE A:  Mild hyperglycemia  P:   SSI Glucose monitoring   NEUROLOGIC A:  Hx of CVA, sedation for mechanical ventilation P:   PT needed when resp better Off sedation currently  FAMILY  - Updates: no family available 12/17 or 12/18 am, wife by me 12/20 - Inter-disciplinary family meet or Palliative Care meeting due by:  12/24   Albin Felling, MD, MPH Internal Medicine Resident, PGY-II Pager: [phone]   STAFF NOTE: Linwood Dibbles, MD FACP have personally reviewed patient's available data, including medical history, events of note, physical examination and test results as part of my evaluation. I have discussed with resident/NP and other care providers such as pharmacist, RN and RRT. In addition, I  personally evaluated patient and elicited key findings of: required emergent intubation yesterday then O2 needs better after blood aspirated out of lungs, obstructions noted also noted and suctioned, concern is necrotizing pNA, O2 needs are better and he came in with coumadin therapeutic making PE unlikely, will CT chest to assess duration treatment and ID necrotizing?, weaning today ps 10, cpap 5, unable to extubate as of now, if bleeding again - ffp, ddavp, no coumadin, PT okay, consider repeat HD today witll d/w renal, i update dwife The patient is critically ill with multiple organ systems failure and requires high complexity decision making for assessment and support, frequent evaluation and titration of therapies, application of advanced monitoring technologies and extensive interpretation of multiple databases.   Critical Care Time devoted to patient care services described in this note is 30 Minutes. This time reflects time of care of this signee: Merrie Roof, MD FACP. This critical care time does not reflect procedure time, or teaching time or supervisory time of PA/NP/Med student/Med Resident etc but could involve care discussion time. Rest per NP/medical resident whose note is outlined above and that I agree with   Lavon Paganini. Titus Mould, MD, [place] Pgr: [facility] 06/13/2015 10:57 AM

## 2015-06-13 NOTE — Progress Notes (Signed)
[facility] ROUNDING NOTE   Subjective:   Interval History:  Still with dyspnea   Objective:  Vital signs in last 24 hours:  Temp:  [97.6 F (36.4 C)-99.6 F (37.6 C)] 98.9 F (37.2 C) (12/22 1300) Pulse Rate:  [87-121] 100 (12/22 1325) Resp:  [14-21] 18 (12/22 1325) BP: (67-155)/(42-74) 138/56 mmHg (12/22 1325) SpO2:  [88 %-100 %] 93 % (12/22 1325) FiO2 (%):  [40 %] 40 % (12/22 1300) Weight:  [83.2 kg (183 lb 6.8 oz)-86 kg (189 lb 9.5 oz)] 83.2 kg (183 lb 6.8 oz) (12/22 0500)  Weight change: 0.5 kg (1 lb 1.6 oz) Filed Weights   06/12/15 0500 06/12/15 1525 06/13/15 0500  Weight: 85.5 kg (188 lb 7.9 oz) 86 kg (189 lb 9.5 oz) 83.2 kg (183 lb 6.8 oz)    Intake/Output: I/O last 3 completed shifts: In: 1280.9 [I.V.:400.9; Other:500; NG/GT:330; IV Piggyback:50] Out: 2800 [Emesis/NG output:200; Other:2500; Chest Tube:100]   Intake/Output this shift:  Total I/O In: 180 [NG/GT:180] Out: -    chronically ill appearing male CVS- RRR chest perm cath, L chest tube R RS- coarse, ronchi ABD- BS present soft non-distended EXT-  2 + edema   Basic Metabolic Panel:  Recent Labs Lab 06/08/15 0307  06/08/15 2240 06/09/15 0600 06/09/15 1105 06/09/15 2310 06/10/15 0150 06/10/15 1034 06/10/15 2255 06/11/15 0550 06/12/15 1550  NA 136  --   --  132*  --   --  133*  --   --  137 137  K 4.5  --   --  3.5  --   --  4.0  --   --  4.7 5.8*  CL 101  --   --  96*  --   --  96*  --   --  98* 98*  CO2 22  --   --  24  --   --  24  --   --  26 24  GLUCOSE 179*  --   --  211*  --   --  137*  --   --  133* 147*  BUN 24*  --   --  29*  --   --  61*  --   --  44* 102*  CREATININE 5.21*  --   --  4.01*  --   --  5.70*  --   --  4.39* 7.21*  CALCIUM 7.9*  --   --  8.4*  --   --  8.4*  --   --  8.9 8.0*  MG 2.0  < > 1.9  --  2.2 2.3  --  2.4 2.3  --   --   PHOS 5.4*  < > 4.0 4.5 4.0 5.2* 5.7* 6.2* 5.2*  --  9.0*  < > = values in this interval not displayed.  Liver Function  Tests:  Recent Labs Lab 06/08/15 0307 06/09/15 0600 06/10/15 0150 06/12/15 1550  AST 27  --   --   --   ALT 13*  --   --   --   ALKPHOS 56  --   --   --   BILITOT 0.8  --   --   --   PROT 6.0*  --   --   --   ALBUMIN 2.9* 2.6* 2.7* 2.7*   No results for input(s): LIPASE, AMYLASE in the last 168 hours. No results for input(s): AMMONIA in the last 168 hours.  CBC:  Recent Labs Lab 06/07/15 1515  06/09/15  0600 06/10/15 0150 06/11/15 0550 06/12/15 0346 06/13/15 0227  WBC 12.9*  < > 13.1* 13.6* 15.7* 13.8* 13.2*  NEUTROABS 9.9*  --   --   --  13.0*  --  12.0*  HGB 9.7*  < > 8.4* 7.9* 9.3* 8.2* 8.1*  HCT 30.0*  < > 26.6* 25.8* 31.6* 26.8* 26.6*  MCV 93.8  < > 96.4 96.6 99.7 99.3 98.9  PLT 391  < > 308 313 346 307 278  < > = values in this interval not displayed.  Cardiac Enzymes:  Recent Labs Lab 06/08/15 0830 06/08/15 1425 06/09/15 0600 06/09/15 1105 06/09/15 1620  TROPONINI 0.08* 0.08* 0.06* 0.16* 0.08*    BNP: Invalid input(s): POCBNP  CBG:  Recent Labs Lab 06/12/15 1951 06/12/15 2335 06/13/15 0350 06/13/15 0743 06/13/15 1142  GLUCAP 102* 131* 137* 146* 169*    Microbiology: Results for orders placed or performed during the hospital encounter of 06/07/15  Culture, blood (routine x 2)     Status: None   Collection Time: 06/07/15  9:29 PM  Result Value Ref Range Status   Specimen Description BLOOD LEFT FOREARM  Final   Special Requests BOTTLES DRAWN AEROBIC ONLY 6CCS  Final   Culture NO GROWTH 5 DAYS  Final   Report Status 06/12/2015 FINAL  Final  Culture, blood (routine x 2)     Status: None   Collection Time: 06/07/15  9:38 PM  Result Value Ref Range Status   Specimen Description BLOOD LEFT HAND  Final   Special Requests BOTTLES DRAWN AEROBIC ONLY 4CCS  Final   Culture NO GROWTH 5 DAYS  Final   Report Status 06/12/2015 FINAL  Final  Culture, respiratory (tracheal aspirate)     Status: None   Collection Time: 06/08/15  2:19 AM  Result Value  Ref Range Status   Specimen Description TRACHEAL ASPIRATE  Final   Special Requests Normal  Final   Gram Stain   Final    NO WBC SEEN NO SQUAMOUS EPITHELIAL CELLS SEEN NO ORGANISMS SEEN Performed at [facility]    Culture   Final    FEW KLEBSIELLA OXYTOCA Performed at [facility]    Report Status 06/10/2015 FINAL  Final   Organism ID, Bacteria KLEBSIELLA OXYTOCA  Final      Susceptibility   Klebsiella oxytoca - MIC*    AMPICILLIN >=32 RESISTANT Resistant     AMPICILLIN/SULBACTAM >=32 RESISTANT Resistant     CEFAZOLIN >=64 RESISTANT Resistant     CEFEPIME <=1 SENSITIVE Sensitive     CEFTAZIDIME <=1 SENSITIVE Sensitive     CEFTRIAXONE 2 INTERMEDIATE Intermediate     CIPROFLOXACIN <=0.25 SENSITIVE Sensitive     GENTAMICIN <=1 SENSITIVE Sensitive     IMIPENEM <=0.25 SENSITIVE Sensitive     PIP/TAZO >=128 RESISTANT Resistant     TOBRAMYCIN <=1 SENSITIVE Sensitive     TRIMETH/SULFA Value in next row Sensitive      <=20 SENSITIVE(NOTE)    * FEW KLEBSIELLA OXYTOCA  Culture, blood (x 2)     Status: None (Preliminary result)   Collection Time: 06/08/15  2:57 AM  Result Value Ref Range Status   Specimen Description BLOOD LEFT HAND  Final   Special Requests IN PEDIATRIC BOTTLE 2ML  Final   Culture NO GROWTH 4 DAYS  Final   Report Status PENDING  Incomplete  Culture, blood (x 2)     Status: None (Preliminary result)   Collection Time: 06/08/15  3:07 AM  Result Value Ref  Range Status   Specimen Description BLOOD LEFT HAND  Final   Special Requests IN PEDIATRIC BOTTLE 1 ML  Final   Culture NO GROWTH 4 DAYS  Final   Report Status PENDING  Incomplete  Culture, respiratory (NON-Expectorated)     Status: None (Preliminary result)   Collection Time: 06/12/15  3:03 PM  Result Value Ref Range Status   Specimen Description BRONCHIAL ALVEOLAR LAVAGE  Final   Special Requests NONE  Final   Gram Stain   Final    ABUNDANT WBC PRESENT, PREDOMINANTLY PMN NO SQUAMOUS  EPITHELIAL CELLS SEEN NO ORGANISMS SEEN Performed at [facility]    Culture   Final    NO GROWTH 1 DAY Performed at [facility]    Report Status PENDING  Incomplete    Coagulation Studies:  Recent Labs  06/11/15 0550 06/12/15 0346 06/13/15 0227  LABPROT 19.7* 26.1* 24.4*  INR 1.67* 2.42* 2.22*    Urinalysis: No results for input(s): COLORURINE, LABSPEC, PHURINE, GLUCOSEU, HGBUR, BILIRUBINUR, KETONESUR, PROTEINUR, UROBILINOGEN, NITRITE, LEUKOCYTESUR in the last 72 hours.  Invalid input(s): APPERANCEUR    Imaging: Dg Chest Port 1 View  06/13/2015  CLINICAL DATA:  80 year old male with acute respiratory failure, pneumonia. COPD, end-stage renal disease. Initial encounter. EXAM: PORTABLE CHEST 1 VIEW COMPARISON:  06/12/2015 and earlier. FINDINGS: Portable AP semi upright view at 0505 hours. Intubated. Endotracheal tube tip now projects about 15 mm above the carina. Enteric tube courses to the abdomen, tip not included. Stable right IJ approach dual lumen dialysis type catheter. Stable left chest tube. Further improved left lung base ventilation. Residual reticulonodular opacity at the left lung, and in the right mid and lower lung. No pneumothorax, pulmonary edema, or pleural effusion identified. IMPRESSION: 1. Endotracheal tube tip about 15 mm above the carina. Otherwise stable lines and tubes. 2. Bilateral multifocal pneumonia suspected. Interval improved left lung base ventilation. No pneumothorax, edema, or effusion identified. Electronically Signed   By: Genevie Ann M.D.   On: 06/13/2015 07:25   Dg Chest Port 1 View  06/12/2015  CLINICAL DATA:  Status post endotracheal tube placement EXAM: PORTABLE CHEST - 1 VIEW COMPARISON:  06/12/2015 FINDINGS: Endotracheal tube is now been placed in lies approximately 4 cm above the carina. Nasogastric catheter is noted extending into the stomach. A left-sided chest tube and dialysis catheter are again noted and stable. Patchy  density is again noted throughout the right lung and left base. IMPRESSION: Status post endotracheal tube and nasogastric catheter placement in satisfactory position. The remainder of the exam is stable from the previous study. Electronically Signed   By: Inez Catalina M.D.   On: 06/12/2015 12:21   Dg Chest Port 1 View  06/12/2015  CLINICAL DATA:  Pulmonary edema. EXAM: PORTABLE CHEST 1 VIEW COMPARISON:  06/11/2015. FINDINGS: Right IJ sheath and left chest tube in stable position. Cardiomegaly with bilateral pulmonary interstitial prominence, no change from prior exam. Small left pleural effusion. Tiny left apical pneumothorax again noted . IMPRESSION: 1.  Right IJ sheath and left chest tube in stable position. 2. Cardiomegaly with bilateral pulmonary interstitial prominence consistent congestive heart failure. No interim change from prior exam. Small left pleural effusion . 3.  Tiny left apical pneumothorax again noted. Critical Value/emergent results were called by telephone at the time of interpretation on 06/12/2015 at 7:21 am to nurse Shea Stakes, who verbally acknowledged these results. Electronically Signed   By: Marcello Moores  Register   On: 06/12/2015 07:19   Dg Abd Portable  1v  06/12/2015  CLINICAL DATA:  Orogastric tube placement EXAM: PORTABLE ABDOMEN - 1 VIEW COMPARISON:  None. FINDINGS: Orogastric tube tip and side port are in the stomach. Bowel gas pattern is unremarkable. There is consolidation in the medial left lung base. IMPRESSION: Orogastric tube tip and side port in stomach. Bowel gas pattern unremarkable. Consolidation medial left lung base. Electronically Signed   By: Lowella Grip III M.D.   On: 06/12/2015 12:22     Medications:     . antiseptic oral rinse  7 mL Mouth Rinse q12n4p  . antiseptic oral rinse  7 mL Mouth Rinse QID  . atorvastatin  20 mg Oral q1800  . calcitRIOL  0.5 mcg Oral Daily  . cefTAZidime (FORTAZ)  IV  2 g Intravenous Q M,W,F-HD  . chlorhexidine gluconate  15  mL Mouth Rinse BID  . [START ON 06/17/2015] darbepoetin (ARANESP) injection - DIALYSIS  200 mcg Intravenous Q Mon-HD  . docusate sodium  100 mg Oral BID  . feeding supplement (NEPRO CARB STEADY)  1,000 mL Oral Q24H  . feeding supplement (PRO-STAT SUGAR FREE 64)  30 mL Per Tube QID  . ferric gluconate (FERRLECIT/NULECIT) IV  125 mg Intravenous Q M,W,F-HD  . insulin aspart  0-15 Units Subcutaneous 6 times per day  . ipratropium-albuterol  3 mL Nebulization Q6H  . methylPREDNISolone (SOLU-MEDROL) injection  40 mg Intravenous Q12H  . metoprolol tartrate  50 mg Oral BID  . multivitamin  1 tablet Oral QHS  . pantoprazole sodium  40 mg Per Tube Q1200  . sodium chloride  3 mL Intravenous Q12H  . sodium chloride  3 mL Intravenous Q12H  . succinylcholine  100 mg Intravenous Once   sodium chloride, sodium chloride, sodium chloride, sodium chloride, acetaminophen, alteplase, clonazePAM, fentaNYL (SUBLIMAZE) injection, guaiFENesin, heparin, heparin, heparin, levalbuterol, lidocaine (PF), lidocaine-prilocaine, methocarbamol, metoprolol, pentafluoroprop-tetrafluoroeth, polyethylene glycol, senna-docusate, sodium chloride  Assessment/ Plan:   Necrotizing klebsiella pneumonia  With COPD and hypoxia  -- Bronchoscopy and chest CT are performed for further evaluation to explain degree of hypoxia  Congestive heart failure with diastolic dysfunction   ECHO  :  02/03/13  ESRD  Treated with dialysis    Anemia  Hb 8.1  Treated with Aranesp 255mcg weekly  Palliative care meeting planned      LOS: 6 Mckaylie Vasey W @TODAY @2 :02 PM

## 2015-06-14 DIAGNOSIS — Z9911 Dependence on respirator [ventilator] status: Secondary | ICD-10-CM

## 2015-06-14 DIAGNOSIS — J850 Gangrene and necrosis of lung: Secondary | ICD-10-CM | POA: Diagnosis present

## 2015-06-14 DIAGNOSIS — J150 Pneumonia due to Klebsiella pneumoniae: Secondary | ICD-10-CM | POA: Diagnosis present

## 2015-06-14 DIAGNOSIS — Z9889 Other specified postprocedural states: Secondary | ICD-10-CM

## 2015-06-14 DIAGNOSIS — Z4659 Encounter for fitting and adjustment of other gastrointestinal appliance and device: Secondary | ICD-10-CM

## 2015-06-14 LAB — GLUCOSE, CAPILLARY
GLUCOSE-CAPILLARY: 153 mg/dL — AB (ref 65–99)
GLUCOSE-CAPILLARY: 198 mg/dL — AB (ref 65–99)
Glucose-Capillary: 190 mg/dL — ABNORMAL HIGH (ref 65–99)
Glucose-Capillary: 144 mg/dL — ABNORMAL HIGH (ref 65–99)
Glucose-Capillary: 150 mg/dL — ABNORMAL HIGH (ref 65–99)
Glucose-Capillary: 144 mg/dL — ABNORMAL HIGH (ref 65–99)

## 2015-06-14 LAB — CBC
HEMATOCRIT: 27.6 % — AB (ref 39.0–52.0)
HEMOGLOBIN: 8.5 g/dL — AB (ref 13.0–17.0)
MCH: 31.1 pg (ref 26.0–34.0)
MCHC: 30.8 g/dL (ref 30.0–36.0)
MCV: 101.1 fL — ABNORMAL HIGH (ref 78.0–100.0)
Platelets: 248 10*3/uL (ref 150–400)
RBC: 2.73 MIL/uL — ABNORMAL LOW (ref 4.22–5.81)
RDW: 22.1 % — AB (ref 11.5–15.5)
WBC: 15 10*3/uL — AB (ref 4.0–10.5)

## 2015-06-14 LAB — PROTIME-INR
INR: 1.55 — AB (ref 0.00–1.49)
PROTHROMBIN TIME: 18.6 s — AB (ref 11.6–15.2)

## 2015-06-14 LAB — HEPATITIS B SURFACE ANTIGEN: Hepatitis B Surface Ag: NEGATIVE

## 2015-06-14 MED ORDER — DEXTROSE 5 % IV SOLN
2.0000 g | INTRAVENOUS | Status: DC
  Administered 2015-06-15: 2 g via INTRAVENOUS
  Filled 2015-06-14 (×2): qty 2

## 2015-06-14 MED ORDER — SODIUM CHLORIDE 0.9 % IV SOLN
125.0000 mg | INTRAVENOUS | Status: DC
Start: 2015-06-15 — End: 2015-06-18
  Administered 2015-06-15: 125 mg via INTRAVENOUS
  Filled 2015-06-14 (×3): qty 10

## 2015-06-14 MED ORDER — HEPARIN SODIUM (PORCINE) 5000 UNIT/ML IJ SOLN
5000.0000 [IU] | Freq: Three times a day (TID) | INTRAMUSCULAR | Status: DC
  Administered 2015-06-14 – 2015-06-20 (×17): 5000 [IU] via SUBCUTANEOUS
  Filled 2015-06-14 (×20): qty 1

## 2015-06-14 MED ORDER — METRONIDAZOLE IN NACL 5-0.79 MG/ML-% IV SOLN
500.0000 mg | Freq: Three times a day (TID) | INTRAVENOUS | Status: DC
  Administered 2015-06-14 – 2015-06-16 (×6): 500 mg via INTRAVENOUS
  Filled 2015-06-14 (×8): qty 100

## 2015-06-14 MED ORDER — ZOLPIDEM TARTRATE 5 MG PO TABS
5.0000 mg | ORAL_TABLET | Freq: Every evening | ORAL | Status: DC | PRN
  Administered 2015-06-17 – 2015-06-30 (×8): 5 mg via ORAL
  Filled 2015-06-14 (×9): qty 1

## 2015-06-14 MED ORDER — PIPERACILLIN-TAZOBACTAM IN DEX 2-0.25 GM/50ML IV SOLN
2.2500 g | Freq: Four times a day (QID) | INTRAVENOUS | Status: DC
  Filled 2015-06-14 (×2): qty 50

## 2015-06-14 MED ORDER — PREDNISONE 5 MG/5ML PO SOLN
20.0000 mg | Freq: Every day | ORAL | Status: DC
  Administered 2015-06-15 – 2015-06-19 (×4): 20 mg via ORAL
  Filled 2015-06-14 (×6): qty 20

## 2015-06-14 NOTE — Clinical Social Work Note (Signed)
Patient's wife chooses [facility] as second option. Patient currently on intubated/on ventilator at 40%.  CSW remains available as needed.   Glendon Axe, MSW, LCSWA [phone] 06/14/2015 9:40 AM

## 2015-06-14 NOTE — Progress Notes (Addendum)
PULMONARY / CRITICAL CARE MEDICINE   Name: Terry [name] MRN: [id number] DOB: Nov 27, 1934    ADMISSION DATE:  06/07/2015 CONSULTATION DATE:  06/07/15  REFERRING MD:  Vanita Panda  CHIEF COMPLAINT:  Shortness of breath  HISTORY OF PRESENT ILLNESS:   80yo male with hx ESRD, AFib, COPD, CVA presented 12/16 to the ED with a 2 day history of progressive SOB and cough with yellow sputum. Had worsening respiratory status while at HD and EMS was called.PNA vent  SUBJECTIVE:  tachy  VITAL SIGNS: BP 150/60 mmHg  Pulse 103  Temp(Src) 98.3 F (36.8 C) (Oral)  Resp 22  Ht 5\' 9"  (1.753 m)  Wt 80.4 kg (177 lb 4 oz)  BMI 26.16 kg/m2  SpO2 95%  HEMODYNAMICS:    VENTILATOR SETTINGS: Vent Mode:  [-] PSV;CPAP FiO2 (%):  [40 %] 40 % Set Rate:  [16 bmp] 16 bmp Vt Set:  [550 mL] 550 mL PEEP:  [5 cmH20] 5 cmH20 Pressure Support:  [5 cmH20-10 cmH20] 5 cmH20 Plateau Pressure:  [14 cmH20-22 cmH20] 22 cmH20  INTAKE / OUTPUT: I/O last 3 completed shifts: In: 1029.5 [I.V.:10; NG/GT:969.5; IV Piggyback:50] Out: [id number] [Other:4716]  PHYSICAL EXAMINATION: General: awake, on vent Neuro: awake, follows commands, moves all extremities  CV:  RRR, no m/g/r, L chest tube low output PULM: reduced GI: BS+, soft, non tender Extremities:  1+ BLE edema   LABS:  BMET  Recent Labs Lab 06/11/15 0550 06/12/15 1550 06/13/15 1338  NA 137 137 138  K 4.7 5.8* 4.2  CL 98* 98* 97*  CO2 26 24 28   BUN 44* 102* 64*  CREATININE 4.39* 7.21* 4.32*  GLUCOSE 133* 147* 200*    Electrolytes  Recent Labs Lab 06/09/15 2310  06/10/15 1034 06/10/15 2255 06/11/15 0550 06/12/15 1550 06/13/15 1338  CALCIUM  --   < >  --   --  8.9 8.0* 8.3*  MG 2.3  --  2.4 2.3  --   --   --   PHOS 5.2*  < > 6.2* 5.2*  --  9.0* 5.5*  < > = values in this interval not displayed.  CBC  Recent Labs Lab 06/12/15 0346 06/13/15 0227 06/14/15 0256  WBC 13.8* 13.2* 15.0*  HGB 8.2* 8.1* 8.5*  HCT 26.8* 26.6* 27.6*  PLT 307  278 248    Coag's  Recent Labs Lab 06/12/15 0346 06/13/15 0227 06/14/15 0256  INR 2.42* 2.22* 1.55*    Sepsis Markers  Recent Labs Lab 06/08/15 0307 06/08/15 0600  LATICACIDVEN 1.4 1.6  PROCALCITON 1.79  --     ABG  Recent Labs Lab 06/08/15 06/10/15 1419 06/11/15 1255  PHART 7.365 7.385 7.342*  PCO2ART 46.9* 47.5* 48.9*  PO2ART 195.0* 64.0* 66.5*    Liver Enzymes  Recent Labs Lab 06/08/15 0307  06/10/15 0150 06/12/15 1550 06/13/15 1338  AST 27  --   --   --   --   ALT 13*  --   --   --   --   ALKPHOS 56  --   --   --   --   BILITOT 0.8  --   --   --   --   ALBUMIN 2.9*  < > 2.7* 2.7* 2.7*  < > = values in this interval not displayed.  Cardiac Enzymes  Recent Labs Lab 06/09/15 0600 06/09/15 1105 06/09/15 1620  TROPONINI 0.06* 0.16* 0.08*    Glucose  Recent Labs Lab 06/13/15 1142 06/13/15 1538 06/13/15 1934 06/13/15 2340 06/14/15  0355 06/14/15 0739  GLUCAP 169* 127* 149* 153* 190* 144*    Imaging Ct Angio Chest Pe W/cm &/or Wo Cm  06/13/2015  CLINICAL DATA:  Short of breath. Hypoxia. History of hypertension and stroke. Dialysis patient. EXAM: CT ANGIOGRAPHY CHEST WITH CONTRAST TECHNIQUE: Multidetector CT imaging of the chest was performed using the standard protocol during bolus administration of intravenous contrast. Multiplanar CT image reconstructions and MIPs were obtained to evaluate the vascular anatomy. CONTRAST:  41mL OMNIPAQUE IOHEXOL 350 MG/ML SOLN COMPARISON:  Chest radiograph, 06/13/2015. FINDINGS: Angiographic study: There is no evidence of a pulmonary embolus. Great vessels normal in caliber. Atherosclerotic calcifications noted throughout the thoracic aorta. Neck base and axilla:  No mass or adenopathy. Mediastinum and hila: Mildly enlarged heart. Dense multivessel coronary artery calcifications. No mediastinal or hilar masses. There are calcifications along the right hilum consistent with healed granulomatous disease. No  discrete enlarged lymph nodes. Endotracheal tube tip well-positioned 2 cm above the chronic. Orogastric tube passes well below the diaphragm into the mid stomach. Lungs and pleural: There multiple irregular areas of ground-glass and more dense airspace consolidation. These are seen in both lungs but predominate in the right upper lobe and posterior aspects of both lower lobes. Some of the opacity in the posterior lower lobes is likely atelectasis. The majority of the lung opacity most suggestive of multifocal pneumonia. No convincing pulmonary edema. There are underlying changes of emphysema. Left-sided chest tube extends along the lateral margin of the left oblique fissure. He minimal left pneumothorax is noted along the anterior mid to lower left hemi thorax. No pleural effusions. Limited upper abdomen: Liver shows relative enlargement of the lateral segment left lobe is well as surface nodularity, findings suggesting cirrhosis. No liver mass or focal lesion. Visualized upper kidneys show numerous cysts and cortical thinning. No acute findings in the upper abdomen. Musculoskeletal: Bones are demineralized. There are degenerative changes noted along the thoracic spine. No osteoblastic or osteolytic lesions. Review of the MIP images confirms the above findings. IMPRESSION: 1. No evidence of a pulmonary embolism. 2. Heterogeneous areas of lung consolidation consistent with multifocal pneumonia. This is most evident on the right. No evidence of pulmonary edema. 3. Minimal left pneumothorax. 4. Support apparatus is well positioned. Electronically Signed   By: Lajean Manes M.D.   On: 06/13/2015 19:06     STUDIES:  CT chest 12/22>>>necrotizing infiltrate rt ( DF read), matches bronch findings  CULTURES: Urine 12/16>>> no UOP BC x 2 12/16>>> Sputum 12/16>>>kleb, int ceftriaxone  ANTIBIOTICS: vanc 12/16>>>12/19 Cefepime 12/16>>>12/19 ceftaz 12/19>>>stop 12/23 Zosyn 12/23>>>  SIGNIFICANT EVENTS: 12/16  intubated, L tension ptx post intubation, chest tube placed in ER  Extubated 12/19- worsening distress, HD, bipap, small PTX resolve dafter chest tube back to suction 12/20- weak resp status remains, failed high flow nasal cannula O2 reservoir  12/21- required intubation, bronch w/ bloody clots, no DAH 12/22 neg 1.5 liters overall hd  LINES/TUBES: ETT 12/16>>>12/18 R chest perm cath >>> L chest tube 12/16>>>   ASSESSMENT / PLAN:  PULMONARY A: Acute hypoxemic respiratory failure AECOPD  L PTX spont on vent Klebsiella PNA - necrotizing noted on CT P:   Weaning on CPAP ps 10 required - failing now, tachy, rvr, distress Chest tube to water seal, keep in until off vent solumedrol 40 mg q12h, wean more rapid with infection on CT Chest CT reviewed as predicted concerns necrotizing rt  CARDIOVASCULAR HTN  CAD  AFib on coumadin , rvr this am on wean  Possible Acute on Chronic Diastolic heart failure, documented on Echo 02/03/13 P:  Home metoprolol Coumadin on hold due to bloody resp secretions  Re assess rate after full support back to vent  RENAL A:  ESRD on HD P:   Chem in AM HD today again , thank you renal Renal following   GASTROINTESTINAL A: TF, higher risk again for dysphagia P:   TF PPI   HEMATOLOGIC A:  Anemia of chronic disease Leukocytosis hemoconcetartion bleeding on bronch improved P:  CBC in AM Heparin/coumadin on hold due to blood clots found on bronch Add sub q heparin   INFECTIOUS A:  Sepsis 2/2 HCAP, necrotizing process P:   ceftaz to stop date CT chest reviewed Add polymicrobial, to cover polymicrobial concerns and extend duration with necrotizing (regardeless of klieb noted) ID consult duration?  ENDOCRINE A:  Mild hyperglycemia  P:   SSI Glucose monitoring   NEUROLOGIC A:  Hx of CVA, sedation for mechanical ventilation P:   Consider fent prn  FAMILY  - Updates: daily wife - Inter-disciplinary family meet or Palliative Care  meeting due by:  12/24  Ccm time 30 min  Lavon Paganini. Titus Mould, MD, [place] Pgr: [facility] 06/14/2015 9:19 AM

## 2015-06-14 NOTE — Plan of Care (Signed)
Problem: Phase I Progression Outcomes Goal: VTE prophylaxis Outcome: Progressing Pt did have heparin drip going on this admission but due to bloody secretions from bronchoscopy, heparin drip was stopped. Patient does have SCDs.

## 2015-06-14 NOTE — Consult Note (Signed)
Date of Admission:  06/07/2015  Date of Consult:  06/14/2015  Reason for Consult: necrotizing pneumonia with MDR klebsiella  Referring Physician: Dr.  Titus Mould   HPI: Terry [name] is an 80 y.o. male with cardiomyopathy, CKD, CAD, HTN, Atrial firillation admitted with SOB, yellow sputum production found to have necrotizing multilobar pneumonia, sp intubation, extubation, reintubation. He has grown klebsiella from culture that is S to ceftaz, cefepime and imipenem but otherwise R to beta lactams.  He had most recent re-intubation on the 21st with bronchoscopy and new cultures obtained which are also growin GNR.   Past Medical History  Diagnosis Date  . Stroke ([place]) 2012  . High cholesterol   . Hypertension   . Kidney disease     CKD4, sees [place] [facility], considering dialysis  . COPD (chronic obstructive pulmonary disease) ([place])   . CAD (coronary artery disease) [place], [name]  . Acute and chronic respiratory failure 10/22/2012  . Osteoarthritis 10/22/2012  . Pneumonia April 2014  . Atrial fibrillation [facility])     Past Surgical History  Procedure Laterality Date  . Coronary stent placement  1999    2 stents  . Appendectomy  1984  . Colon surgery  2012    partial colon removed - twisted bowel  . Hernia repair      Right inguinal  . Cystoscopy/retrograde/ureteroscopy Bilateral 04/23/2014    Procedure: CYSTOSCOPY BILATERAL RETROGRADE,  LEFT URETEROSCOPY, LEFT STENT PLACEMENT;  Surgeon: Raynelle Bring, MD;  Location: [facility];  Service: Urology;  Laterality: Bilateral;  . Av fistula placement Right 05/21/2015    Procedure: Right Arm Brachiocephalic ARTERIOVENOUS (AV) FISTULA CREATION;  Surgeon: Angelia Mould, MD;  Location: [facility];  Service: Vascular;  Laterality: Right;    Social History:  reports that he quit smoking about 15 years ago. His smoking use included Cigarettes, Pipe, and Cigars. He has a 49 pack-year smoking history. He has never used smokeless  tobacco. He reports that he does not drink alcohol or use illicit drugs.   Family History  Problem Relation Age of Onset  . Cancer Mother     Breast  . Heart disease Father   . Heart attack Father   . Parkinson's disease Brother     Allergies  Allergen Reactions  . Yellow Dyes (Non-Tartrazine) Other (See Comments)    Burns arms Cough medications (tussionex and benzonatate ok)  . Levaquin [Levofloxacin In D5w] Itching     Medications: I have reviewed patients current medications as documented in Epic Anti-infectives    Start     Dose/Rate Route Frequency Ordered Stop   06/15/15 1200  cefTAZidime (FORTAZ) 2 g in dextrose 5 % 50 mL IVPB     2 g 100 mL/hr over 30 Minutes Intravenous Every T-Th-Sa (Hemodialysis) 06/14/15 1038     06/14/15 1200  piperacillin-tazobactam (ZOSYN) IVPB 2.25 g  Status:  Discontinued     2.25 g 100 mL/hr over 30 Minutes Intravenous 4 times per day 06/14/15 0930 06/14/15 1038   06/14/15 1100  metroNIDAZOLE (FLAGYL) IVPB 500 mg     500 mg 100 mL/hr over 60 Minutes Intravenous Every 8 hours 06/14/15 1020     06/13/15 2000  cefTAZidime (FORTAZ) 2 g in dextrose 5 % 50 mL IVPB     2 g 100 mL/hr over 30 Minutes Intravenous  Once 06/13/15 1648 06/13/15 2109   06/10/15 1700  cefTAZidime (FORTAZ) 1 g in dextrose 5 % 50 mL  IVPB  Status:  Discontinued     1 g 100 mL/hr over 30 Minutes Intravenous Every 24 hours 06/10/15 1013 06/10/15 1024   06/10/15 1200  cefTAZidime (FORTAZ) 2 g in dextrose 5 % 50 mL IVPB  Status:  Discontinued     2 g 100 mL/hr over 30 Minutes Intravenous Every M-W-F (Hemodialysis) 06/10/15 1024 06/14/15 0930   06/09/15 1745  vancomycin (VANCOCIN) IVPB 1000 mg/200 mL premix     1,000 mg 200 mL/hr over 60 Minutes Intravenous  Once 06/09/15 1718 06/09/15 2105   06/08/15 1730  vancomycin (VANCOCIN) IVPB 1000 mg/200 mL premix     1,000 mg 200 mL/hr over 60 Minutes Intravenous  Once 06/08/15 1721 06/08/15 2141   06/08/15 1700  ceFEPIme  (MAXIPIME) 1 g in dextrose 5 % 50 mL IVPB  Status:  Discontinued     1 g 100 mL/hr over 30 Minutes Intravenous Every 24 hours 06/07/15 1951 06/10/15 1013   06/08/15 0230  vancomycin (VANCOCIN) IVPB 1000 mg/200 mL premix     1,000 mg 200 mL/hr over 60 Minutes Intravenous NOW 06/08/15 0227 06/08/15 0349   06/07/15 1951  vancomycin (VANCOCIN) IVPB 1000 mg/200 mL premix  Status:  Discontinued     1,000 mg 200 mL/hr over 60 Minutes Intravenous Every Dialysis 06/07/15 1951 06/07/15 2248   06/07/15 1715  vancomycin (VANCOCIN) 2,000 mg in sodium chloride 0.9 % 500 mL IVPB     2,000 mg 250 mL/hr over 120 Minutes Intravenous  Once 06/07/15 1708 06/07/15 2300   06/07/15 1700  ceFEPIme (MAXIPIME) 1 g in dextrose 5 % 50 mL IVPB     1 g 100 mL/hr over 30 Minutes Intravenous  Once 06/07/15 1658 06/07/15 1834         ROS: as in HPI otherwise remainder of 12 point Review of Systems is not obtainable due to patient's being intubated   Blood pressure 128/56, pulse 95, temperature 98 F (36.7 C), temperature source Oral, resp. rate 17, height _0  (1.753 m), weight 177 lb 4 oz (80.4 kg), SpO2 96 %. General: sedated and on the ventilator HEENT: anicteric sclera, ET tube Cardiovascular: tachy rate, normal r,  no murmur rubs or gallops Pulmonary:copious hyperactive bowel sounds heard on exam of lungs and GI Gastrointestinal: soft  nondistended, hyperactive bowel sounds Musculoskeletal: no  clubbing or edema noted bilaterally Skin, soft tissue: no rashes Neuro: nonfocal, strength and sensation intact   Results for orders placed or performed during the hospital encounter of 06/07/15 (from the past 48 hour(s))  Body fluid cell count with differential     Status: Abnormal   Collection Time: 06/12/15  3:03 PM  Result Value Ref Range   Fluid Type-FCT BRONCHIAL ALVEOLAR LAVAGE     Comment: CORRECTED ON 12/21 AT 1609: PREVIOUSLY REPORTED AS Bronch [place]   Color, Fluid RED (A) YELLOW   Appearance, Fluid  BLOODY (A) CLEAR   WBC, Fluid UNABLE TO PERFORM COUNT DUE TO CLOT IN SPECIMEN 0 - 1000 cu mm   Neutrophil Count, Fluid 95 (H) 0 - 25 %   Lymphs, Fluid 3 %   Monocyte-Macrophage-Serous Fluid 2 (L) 50 - 90 %   Eos, Fluid 0 %   Other Cells, Fluid PYKNOTIC NEUTROPHILS NOTED %  Culture, respiratory (NON-Expectorated)     Status: None (Preliminary result)   Collection Time: 06/12/15  3:03 PM  Result Value Ref Range   Specimen Description BRONCHIAL ALVEOLAR LAVAGE    Special Requests NONE    Gram Stain  ABUNDANT WBC PRESENT, PREDOMINANTLY PMN NO SQUAMOUS EPITHELIAL CELLS SEEN NO ORGANISMS SEEN Performed at [place] Performed at [facility]    Report Status PENDING   Pathologist smear review     Status: None   Collection Time: 06/12/15  3:03 PM  Result Value Ref Range   Path Review Mixed inflammatory cells,     Comment: Predominantly neutrophils. Reviewed by Lennox Solders Lyndon Code, M.D. 06/13/15   Glucose, capillary     Status: Abnormal   Collection Time: 06/12/15  3:18 PM  Result Value Ref Range   Glucose-Capillary 136 (H) 65 - 99 mg/dL  Renal function panel     Status: Abnormal   Collection Time: 06/12/15  3:50 PM  Result Value Ref Range   Sodium 137 135 - 145 mmol/L   Potassium 5.8 (H) 3.5 - 5.1 mmol/L    Comment: REPEATED TO VERIFY NO VISIBLE HEMOLYSIS DELTA CHECK NOTED    Chloride 98 (L) 101 - 111 mmol/L   CO2 24 22 - 32 mmol/L   Glucose, Bld 147 (H) 65 - 99 mg/dL   BUN 102 (H) 6 - 20 mg/dL   Creatinine, Ser 7.21 (H) 0.61 - 1.24 mg/dL    Comment: REPEATED TO VERIFY DELTA CHECK NOTED    Calcium 8.0 (L) 8.9 - 10.3 mg/dL   Phosphorus 9.0 (H) 2.5 - 4.6 mg/dL   Albumin 2.7 (L) 3.5 - 5.0 g/dL   GFR calc non Af Amer 6 (L) >60 mL/min   GFR calc Af Amer 7 (L) >60 mL/min    Comment: (NOTE) The eGFR has been calculated using the CKD EPI equation. This calculation has not been validated in all clinical  situations. eGFR's persistently <60 mL/min signify possible Chronic Kidney Disease.    Anion gap 15 5 - 15  Glucose, capillary     Status: Abnormal   Collection Time: 06/12/15  7:51 PM  Result Value Ref Range   Glucose-Capillary 102 (H) 65 - 99 mg/dL  Glucose, capillary     Status: Abnormal   Collection Time: 06/12/15 11:35 PM  Result Value Ref Range   Glucose-Capillary 131 (H) 65 - 99 mg/dL  Protime-INR     Status: Abnormal   Collection Time: 06/13/15  2:27 AM  Result Value Ref Range   Prothrombin Time 24.4 (H) 11.6 - 15.2 seconds   INR 2.22 (H) 0.00 - 1.49  CBC with Differential/Platelet     Status: Abnormal   Collection Time: 06/13/15  2:27 AM  Result Value Ref Range   WBC 13.2 (H) 4.0 - 10.5 K/uL   RBC 2.69 (L) 4.22 - 5.81 MIL/uL   Hemoglobin 8.1 (L) 13.0 - 17.0 g/dL   HCT 26.6 (L) 39.0 - 52.0 %   MCV 98.9 78.0 - 100.0 fL   MCH 30.1 26.0 - 34.0 pg   MCHC 30.5 30.0 - 36.0 g/dL   RDW 21.1 (H) 11.5 - 15.5 %   Platelets 278 150 - 400 K/uL   Neutrophils Relative % 91 %   Lymphocytes Relative 6 %   Monocytes Relative 3 %   Eosinophils Relative 0 %   Basophils Relative 0 %   Neutro Abs 12.0 (H) 1.7 - 7.7 K/uL   Lymphs Abs 0.8 0.7 - 4.0 K/uL   Monocytes Absolute 0.4 0.1 - 1.0 K/uL   Eosinophils Absolute 0.0 0.0 - 0.7 K/uL   Basophils Absolute 0.0 0.0 - 0.1 K/uL  RBC Morphology POLYCHROMASIA PRESENT     Comment: RARE NRBCs  Glucose, capillary     Status: Abnormal   Collection Time: 06/13/15  3:50 AM  Result Value Ref Range   Glucose-Capillary 137 (H) 65 - 99 mg/dL  Glucose, capillary     Status: Abnormal   Collection Time: 06/13/15  7:43 AM  Result Value Ref Range   Glucose-Capillary 146 (H) 65 - 99 mg/dL  Glucose, capillary     Status: Abnormal   Collection Time: 06/13/15 11:42 AM  Result Value Ref Range   Glucose-Capillary 169 (H) 65 - 99 mg/dL  Renal function panel     Status: Abnormal   Collection Time: 06/13/15  1:38 PM  Result Value Ref Range   Sodium 138  135 - 145 mmol/L   Potassium 4.2 3.5 - 5.1 mmol/L    Comment: DELTA CHECK NOTED   Chloride 97 (L) 101 - 111 mmol/L   CO2 28 22 - 32 mmol/L   Glucose, Bld 200 (H) 65 - 99 mg/dL   BUN 64 (H) 6 - 20 mg/dL   Creatinine, Ser 4.32 (H) 0.61 - 1.24 mg/dL    Comment: DELTA CHECK NOTED   Calcium 8.3 (L) 8.9 - 10.3 mg/dL   Phosphorus 5.5 (H) 2.5 - 4.6 mg/dL   Albumin 2.7 (L) 3.5 - 5.0 g/dL   GFR calc non Af Amer 12 (L) >60 mL/min   GFR calc Af Amer 14 (L) >60 mL/min    Comment: (NOTE) The eGFR has been calculated using the CKD EPI equation. This calculation has not been validated in all clinical situations. eGFR's persistently <60 mL/min signify possible Chronic Kidney Disease.    Anion gap 13 5 - 15  Hepatitis B surface antigen     Status: None   Collection Time: 06/13/15  1:39 PM  Result Value Ref Range   Hepatitis B Surface Ag Negative Negative    Comment: (NOTE) Performed At: [facility] [address] [place], [name] [id number] Lindon Romp MD TW:[id number]   Glucose, capillary     Status: Abnormal   Collection Time: 06/13/15  3:38 PM  Result Value Ref Range   Glucose-Capillary 127 (H) 65 - 99 mg/dL  Glucose, capillary     Status: Abnormal   Collection Time: 06/13/15  7:34 PM  Result Value Ref Range   Glucose-Capillary 149 (H) 65 - 99 mg/dL   Comment 1 Notify RN    Comment 2 Document in Chart   Glucose, capillary     Status: Abnormal   Collection Time: 06/13/15 11:40 PM  Result Value Ref Range   Glucose-Capillary 153 (H) 65 - 99 mg/dL  Protime-INR     Status: Abnormal   Collection Time: 06/14/15  2:56 AM  Result Value Ref Range   Prothrombin Time 18.6 (H) 11.6 - 15.2 seconds   INR 1.55 (H) 0.00 - 1.49  CBC     Status: Abnormal   Collection Time: 06/14/15  2:56 AM  Result Value Ref Range   WBC 15.0 (H) 4.0 - 10.5 K/uL   RBC 2.73 (L) 4.22 - 5.81 MIL/uL   Hemoglobin 8.5 (L) 13.0 - 17.0 g/dL   HCT 27.6 (L) 39.0 - 52.0 %   MCV 101.1 (H) 78.0 - 100.0 fL    MCH 31.1 26.0 - 34.0 pg   MCHC 30.8 30.0 - 36.0 g/dL   RDW 22.1 (H) 11.5 - 15.5 %   Platelets 248 150 - 400 K/uL  Glucose, capillary  Status: Abnormal   Collection Time: 06/14/15  3:55 AM  Result Value Ref Range   Glucose-Capillary 190 (H) 65 - 99 mg/dL  Glucose, capillary     Status: Abnormal   Collection Time: 06/14/15  7:39 AM  Result Value Ref Range   Glucose-Capillary 144 (H) 65 - 99 mg/dL  Glucose, capillary     Status: Abnormal   Collection Time: 06/14/15 11:21 AM  Result Value Ref Range   Glucose-Capillary 150 (H) 65 - 99 mg/dL   _0 (sdes,specrequest,cult,reptstatus)   ) Recent Results (from the past 720 hour(s))  Surgical pcr screen     Status: Abnormal   Collection Time: 05/21/15  5:09 AM  Result Value Ref Range Status   MRSA, PCR POSITIVE (A) NEGATIVE Final   Staphylococcus aureus POSITIVE (A) NEGATIVE Final    Comment:        The Xpert SA Assay (FDA approved for NASAL specimens in patients over 76 years of age), is one component of a comprehensive surveillance program.  Test performance has been validated by [facility] for patients greater than or equal to 53 year old. It is not intended to diagnose infection nor to guide or monitor treatment.   Culture, Urine     Status: None   Collection Time: 06/02/15  2:59 PM  Result Value Ref Range Status   Specimen Description URINE, CATHETERIZED  Final   Special Requests zosyn and vancomycin  Final   Culture NO GROWTH 1 DAY  Final   Report Status 06/03/2015 FINAL  Final  Culture, blood (routine x 2)     Status: None   Collection Time: 06/07/15  9:29 PM  Result Value Ref Range Status   Specimen Description BLOOD LEFT FOREARM  Final   Special Requests BOTTLES DRAWN AEROBIC ONLY 6CCS  Final   Culture NO GROWTH 5 DAYS  Final   Report Status 06/12/2015 FINAL  Final  Culture, blood (routine x 2)     Status: None   Collection Time: 06/07/15  9:38 PM  Result Value Ref Range Status   Specimen  Description BLOOD LEFT HAND  Final   Special Requests BOTTLES DRAWN AEROBIC ONLY 4CCS  Final   Culture NO GROWTH 5 DAYS  Final   Report Status 06/12/2015 FINAL  Final  Culture, respiratory (tracheal aspirate)     Status: None   Collection Time: 06/08/15  2:19 AM  Result Value Ref Range Status   Specimen Description TRACHEAL ASPIRATE  Final   Special Requests Normal  Final   Gram Stain   Final    NO WBC SEEN NO SQUAMOUS EPITHELIAL CELLS SEEN NO ORGANISMS SEEN Performed at [facility]    Culture   Final    FEW KLEBSIELLA OXYTOCA Performed at [facility]    Report Status 06/10/2015 FINAL  Final   Organism ID, Bacteria KLEBSIELLA OXYTOCA  Final      Susceptibility   Klebsiella oxytoca - MIC*    AMPICILLIN >=32 RESISTANT Resistant     AMPICILLIN/SULBACTAM >=32 RESISTANT Resistant     CEFAZOLIN >=64 RESISTANT Resistant     CEFEPIME <=1 SENSITIVE Sensitive     CEFTAZIDIME <=1 SENSITIVE Sensitive     CEFTRIAXONE 2 INTERMEDIATE Intermediate     CIPROFLOXACIN <=0.25 SENSITIVE Sensitive     GENTAMICIN <=1 SENSITIVE Sensitive     IMIPENEM <=0.25 SENSITIVE Sensitive     PIP/TAZO >=128 RESISTANT Resistant     TOBRAMYCIN <=1 SENSITIVE Sensitive     TRIMETH/SULFA Value in next row  Sensitive      <=20 SENSITIVE(NOTE)    * FEW KLEBSIELLA OXYTOCA  Culture, blood (x 2)     Status: None   Collection Time: 06/08/15  2:57 AM  Result Value Ref Range Status   Specimen Description BLOOD LEFT HAND  Final   Special Requests IN PEDIATRIC BOTTLE 2ML  Final   Culture NO GROWTH 5 DAYS  Final   Report Status 06/13/2015 FINAL  Final  Culture, blood (x 2)     Status: None   Collection Time: 06/08/15  3:07 AM  Result Value Ref Range Status   Specimen Description BLOOD LEFT HAND  Final   Special Requests IN PEDIATRIC BOTTLE 1 ML  Final   Culture NO GROWTH 5 DAYS  Final   Report Status 06/13/2015 FINAL  Final  Culture, respiratory (NON-Expectorated)     Status: None (Preliminary  result)   Collection Time: 06/12/15  3:03 PM  Result Value Ref Range Status   Specimen Description BRONCHIAL ALVEOLAR LAVAGE  Final   Special Requests NONE  Final   Gram Stain   Final    ABUNDANT WBC PRESENT, PREDOMINANTLY PMN NO SQUAMOUS EPITHELIAL CELLS SEEN NO ORGANISMS SEEN Performed at [facility]    Culture   Final    ABUNDANT GRAM NEGATIVE RODS Performed at [facility]    Report Status PENDING  Incomplete     Impression/Recommendation  Principal Problem:   HCAP (healthcare-associated pneumonia) Active Problems:   COPD GOLD III   CAD (coronary artery disease) of artery bypass graft   Essential hypertension   Acute respiratory failure with hypoxia (HCC)   ESRD (end stage renal disease) (HCC)   Chronic atrial fibrillation (HCC)   Sepsis ([place])   Acute hypoxemic respiratory failure (HCC)   Chest tube in place   Encounter for intubation   Terry [name] is a 80 y.o. male with  multiple medical problems admitted with necrotizing multifocal pneumonia with Klebsiella having been isolated on culture now reintubated with gram-negative rods growing on fresh bronchoscopy  #1 Multifocal pneumonia with Klebsiella that was fairly resistant to many beta lactams having grown on initial culture the 17th now new gram-negative rod growing on cultures from bronchoscopy on the 21st  Would dc zosyn that pt changed to given organism was R to it from the 17th and agree with Ceftazidime Per the micro lab new organism is not looking to be a klebsiella   Agree with adding flagyl for now  Follow-up cultures from new GNR and adjust antibiotics  Upon further review would give patient a total of 21 course of IV antibiotics and then stop them  Dr. Megan Salon is covering over the next few days and will follow-up cultures and is available for questions.      06/14/2015, 2:16 PM   Thank you so much for this interesting consult  [place] [facility] [place] [phone] (pager) [phone] (office) 06/14/2015, 2:16 PM  Rhina [place] 06/14/2015, 2:16 PM

## 2015-06-14 NOTE — Progress Notes (Addendum)
Physical Therapy Treatment Patient Details Name: Terry [name] MRN: [id number] DOB: 07-19-1934 Today's Date: 06/14/2015    History of Present Illness pt is a 80 y/o male with h/o stroke, HTN, COPD, CAD, afib and OA, admitted with 2 day h/o SOB with condition deteriorating in HD.12/21 on endotrachial tube    PT Comments    Patient progressing slowly towards PT goals. Tolerated sitting EOB with UE support however session limited due to increased RR and HR today. HR up to 160s. Diaphoretic. Increased work of breathing sitting EOB. Will continue to follow per current POC.   Follow Up Recommendations  SNF;Supervision/Assistance - 24 hour     Equipment Recommendations  Other (comment) (defer to next venue)    Recommendations for Other Services       Precautions / Restrictions Precautions Precautions: Fall Precaution Comments: monitor HR Restrictions Weight Bearing Restrictions: No    Mobility  Bed Mobility Overal bed mobility: Needs Assistance Bed Mobility: Supine to Sit     Supine to sit: Mod assist;HOB elevated Sit to supine: Min assist;+2 for physical assistance   General bed mobility comments: Assist to elevate trunk. Increased WOB sitting EOB.  Transfers                 General transfer comment: Deferred today secondary to increased WOB and elevated HR.  Ambulation/Gait                 Stairs            Wheelchair Mobility    Modified Rankin (Stroke Patients Only)       Balance Overall balance assessment: Needs assistance Sitting-balance support: Feet supported;Bilateral upper extremity supported Sitting balance-Leahy Scale: Fair Sitting balance - Comments: pt sat EOB for about 5 mins, with UE support as position of comfort due to SOB.                            Cognition Arousal/Alertness: Awake/alert Behavior During Therapy: Anxious Overall Cognitive Status: Difficult to assess (Seems WFL.)                       Exercises      General Comments        Pertinent Vitals/Pain Pain Assessment: Faces Faces Pain Scale: Hurts little more Pain Location: pointed to throat Pain Descriptors / Indicators: Sore (from ETT) Pain Intervention(s): Monitored during session           Sp02 ranged from 89-92% on vent PEEP 5. HR 120-160s bpm  Pre BP          150/60, Post session189/60  Home Living                      Prior Function            PT Goals (current goals can now be found in the care plan section) Progress towards PT goals: Progressing toward goals (slowly)    Frequency  Min 2X/week    PT Plan Current plan remains appropriate    Co-evaluation             End of Session   Activity Tolerance: Treatment limited secondary to medical complications (Comment) (elevated HR, RR) Patient left: in bed;with call bell/phone within reach     Time: 0830-0852 PT Time Calculation (min) (ACUTE ONLY): 22 min  Charges:  $Therapeutic Activity: 8-22 mins  G Codes:      Lacie Draft 06/14/2015, 9:02 AM Wray Kearns, PT, DPT [phone]

## 2015-06-14 NOTE — Progress Notes (Signed)
Terry [name]   Subjective:   Interval History:  No new issues this morning  Objective:  Vital signs in last 24 hours:  Temp:  [97.5 F (36.4 C)-98.9 F (37.2 C)] 98.3 F (36.8 C) (12/23 0745) Pulse Rate:  [94-128] 103 (12/23 0800) Resp:  [9-24] 22 (12/23 0800) BP: (65-150)/(35-82) 150/60 mmHg (12/23 0800) SpO2:  [90 %-100 %] 95 % (12/23 0800) FiO2 (%):  [40 %] 40 % (12/23 0800) Weight:  [80.4 kg (177 lb 4 oz)] 80.4 kg (177 lb 4 oz) (12/23 0457)  Weight change: -5.6 kg (-12 lb 5.5 oz) Filed Weights   06/12/15 1525 06/13/15 0500 06/14/15 0457  Weight: 86 kg (189 lb 9.5 oz) 83.2 kg (183 lb 6.8 oz) 80.4 kg (177 lb 4 oz)    Intake/Output: I/O last 3 completed shifts: In: 1029.5 [I.V.:10; NG/GT:969.5; IV Piggyback:50] Out: [id number] [Other:4716]   Intake/Output this shift:  Total I/O In: 60 [NG/GT:60] Out: 0  chronically ill appearing male CVS- RRR chest perm cath, L chest tube R RS- coarse, ronchi ABD- BS present soft non-distended EXT- 2 + edema  Basic Metabolic Panel:  Recent Labs Lab 06/08/15 2240 06/09/15 0600 06/09/15 1105 06/09/15 2310 06/10/15 0150 06/10/15 1034 06/10/15 2255 06/11/15 0550 06/12/15 1550 06/13/15 1338  NA  --  132*  --   --  133*  --   --  137 137 138  K  --  3.5  --   --  4.0  --   --  4.7 5.8* 4.2  CL  --  96*  --   --  96*  --   --  98* 98* 97*  CO2  --  24  --   --  24  --   --  26 24 28   GLUCOSE  --  211*  --   --  137*  --   --  133* 147* 200*  BUN  --  29*  --   --  61*  --   --  44* 102* 64*  CREATININE  --  4.01*  --   --  5.70*  --   --  4.39* 7.21* 4.32*  CALCIUM  --  8.4*  --   --  8.4*  --   --  8.9 8.0* 8.3*  MG 1.9  --  2.2 2.3  --  2.4 2.3  --   --   --   PHOS 4.0 4.5 4.0 5.2* 5.7* 6.2* 5.2*  --  9.0* 5.5*    Liver Function Tests:  Recent Labs Lab 06/08/15 0307 06/09/15 0600 06/10/15 0150 06/12/15 1550 06/13/15 1338  AST 27  --   --   --   --   ALT 13*  --   --   --   --   ALKPHOS  56  --   --   --   --   BILITOT 0.8  --   --   --   --   PROT 6.0*  --   --   --   --   ALBUMIN 2.9* 2.6* 2.7* 2.7* 2.7*   No results for input(s): LIPASE, AMYLASE in the last 168 hours. No results for input(s): AMMONIA in the last 168 hours.  CBC:  Recent Labs Lab 06/07/15 1515  06/10/15 0150 06/11/15 0550 06/12/15 0346 06/13/15 0227 06/14/15 0256  WBC 12.9*  < > 13.6* 15.7* 13.8* 13.2* 15.0*  NEUTROABS 9.9*  --   --  13.0*  --  12.0*  --   HGB 9.7*  < > 7.9* 9.3* 8.2* 8.1* 8.5*  HCT 30.0*  < > 25.8* 31.6* 26.8* 26.6* 27.6*  MCV 93.8  < > 96.6 99.7 99.3 98.9 101.1*  PLT 391  < > 313 346 307 278 248  < > = values in this interval not displayed.  Cardiac Enzymes:  Recent Labs Lab 06/08/15 0830 06/08/15 1425 06/09/15 0600 06/09/15 1105 06/09/15 1620  TROPONINI 0.08* 0.08* 0.06* 0.16* 0.08*    BNP: Invalid input(s): POCBNP  CBG:  Recent Labs Lab 06/13/15 1538 06/13/15 1934 06/13/15 2340 06/14/15 0355 06/14/15 0739  GLUCAP 127* 149* 153* 190* 144*    Microbiology: Results for orders placed or performed during the hospital encounter of 06/07/15  Culture, blood (routine x 2)     Status: None   Collection Time: 06/07/15  9:29 PM  Result Value Ref Range Status   Specimen Description BLOOD LEFT FOREARM  Final   Special Requests BOTTLES DRAWN AEROBIC ONLY 6CCS  Final   Culture NO GROWTH 5 DAYS  Final   Report Status 06/12/2015 FINAL  Final  Culture, blood (routine x 2)     Status: None   Collection Time: 06/07/15  9:38 PM  Result Value Ref Range Status   Specimen Description BLOOD LEFT HAND  Final   Special Requests BOTTLES DRAWN AEROBIC ONLY 4CCS  Final   Culture NO GROWTH 5 DAYS  Final   Report Status 06/12/2015 FINAL  Final  Culture, respiratory (tracheal aspirate)     Status: None   Collection Time: 06/08/15  2:19 AM  Result Value Ref Range Status   Specimen Description TRACHEAL ASPIRATE  Final   Special Requests Normal  Final   Gram Stain   Final     NO WBC SEEN NO SQUAMOUS EPITHELIAL CELLS SEEN NO ORGANISMS SEEN Performed at [facility]    Culture   Final    FEW KLEBSIELLA OXYTOCA Performed at [facility]    Report Status 06/10/2015 FINAL  Final   Organism ID, Bacteria KLEBSIELLA OXYTOCA  Final      Susceptibility   Klebsiella oxytoca - MIC*    AMPICILLIN >=32 RESISTANT Resistant     AMPICILLIN/SULBACTAM >=32 RESISTANT Resistant     CEFAZOLIN >=64 RESISTANT Resistant     CEFEPIME <=1 SENSITIVE Sensitive     CEFTAZIDIME <=1 SENSITIVE Sensitive     CEFTRIAXONE 2 INTERMEDIATE Intermediate     CIPROFLOXACIN <=0.25 SENSITIVE Sensitive     GENTAMICIN <=1 SENSITIVE Sensitive     IMIPENEM <=0.25 SENSITIVE Sensitive     PIP/TAZO >=128 RESISTANT Resistant     TOBRAMYCIN <=1 SENSITIVE Sensitive     TRIMETH/SULFA Value in next row Sensitive      <=20 SENSITIVE([name])    * FEW KLEBSIELLA OXYTOCA  Culture, blood (x 2)     Status: None   Collection Time: 06/08/15  2:57 AM  Result Value Ref Range Status   Specimen Description BLOOD LEFT HAND  Final   Special Requests IN PEDIATRIC BOTTLE 2ML  Final   Culture NO GROWTH 5 DAYS  Final   Report Status 06/13/2015 FINAL  Final  Culture, blood (x 2)     Status: None   Collection Time: 06/08/15  3:07 AM  Result Value Ref Range Status   Specimen Description BLOOD LEFT HAND  Final   Special Requests IN PEDIATRIC BOTTLE 1 ML  Final   Culture NO GROWTH 5 DAYS  Final   Report Status 06/13/2015  FINAL  Final  Culture, respiratory (NON-Expectorated)     Status: None (Preliminary result)   Collection Time: 06/12/15  3:03 PM  Result Value Ref Range Status   Specimen Description BRONCHIAL ALVEOLAR LAVAGE  Final   Special Requests NONE  Final   Gram Stain   Final    ABUNDANT WBC PRESENT, PREDOMINANTLY PMN NO SQUAMOUS EPITHELIAL CELLS SEEN NO ORGANISMS SEEN Performed at [facility]    Culture   Final    ABUNDANT GRAM NEGATIVE RODS Performed at [facility]     Report Status PENDING  Incomplete    Coagulation Studies:  Recent Labs  06/12/15 0346 06/13/15 0227 06/14/15 0256  LABPROT 26.1* 24.4* 18.6*  INR 2.42* 2.22* 1.55*    Urinalysis: No results for input(s): COLORURINE, LABSPEC, PHURINE, GLUCOSEU, HGBUR, BILIRUBINUR, KETONESUR, PROTEINUR, UROBILINOGEN, NITRITE, LEUKOCYTESUR in the last 72 hours.  Invalid input(s): APPERANCEUR    Imaging: Ct Angio Chest Pe W/cm &/or Wo Cm  06/13/2015  CLINICAL DATA:  Short of breath. Hypoxia. History of hypertension and stroke. Dialysis patient. EXAM: CT ANGIOGRAPHY CHEST WITH CONTRAST TECHNIQUE: Multidetector CT imaging of the chest was performed using the standard protocol during bolus administration of intravenous contrast. Multiplanar CT image reconstructions and MIPs were obtained to evaluate the vascular anatomy. CONTRAST:  50mL OMNIPAQUE IOHEXOL 350 MG/ML SOLN COMPARISON:  Chest radiograph, 06/13/2015. FINDINGS: Angiographic study: There is no evidence of a pulmonary embolus. Great vessels normal in caliber. Atherosclerotic calcifications noted throughout the thoracic aorta. Neck base and axilla:  No mass or adenopathy. Mediastinum and hila: Mildly enlarged heart. Dense multivessel coronary artery calcifications. No mediastinal or hilar masses. There are calcifications along the right hilum consistent with healed granulomatous disease. No discrete enlarged lymph nodes. Endotracheal tube tip well-positioned 2 cm above the chronic. Orogastric tube passes well below the diaphragm into the mid stomach. Lungs and pleural: There multiple irregular areas of ground-glass and more dense airspace consolidation. These are seen in both lungs but predominate in the right upper lobe and posterior aspects of both lower lobes. Some of the opacity in the posterior lower lobes is likely atelectasis. The majority of the lung opacity most suggestive of multifocal pneumonia. No convincing pulmonary edema. There are underlying  changes of emphysema. Left-sided chest tube extends along the lateral margin of the left oblique fissure. He minimal left pneumothorax is noted along the anterior mid to lower left hemi thorax. No pleural effusions. Limited upper abdomen: Liver shows relative enlargement of the lateral segment left lobe is well as surface nodularity, findings suggesting cirrhosis. No liver mass or focal lesion. Visualized upper kidneys show numerous cysts and cortical thinning. No acute findings in the upper abdomen. Musculoskeletal: Bones are demineralized. There are degenerative changes noted along the thoracic spine. No osteoblastic or osteolytic lesions. Review of the MIP images confirms the above findings. IMPRESSION: 1. No evidence of a pulmonary embolism. 2. Heterogeneous areas of lung consolidation consistent with multifocal pneumonia. This is most evident on the right. No evidence of pulmonary edema. 3. Minimal left pneumothorax. 4. Support apparatus is well positioned. Electronically Signed   By: Lajean Manes M.D.   On: 06/13/2015 19:06   Dg Chest Port 1 View  06/13/2015  CLINICAL DATA:  80 year old male with acute respiratory failure, pneumonia. COPD, end-stage renal disease. Initial encounter. EXAM: PORTABLE CHEST 1 VIEW COMPARISON:  06/12/2015 and earlier. FINDINGS: Portable AP semi upright view at 0505 hours. Intubated. Endotracheal tube tip now projects about 15 mm above the carina. Enteric  tube courses to the abdomen, tip not included. Stable right IJ approach dual lumen dialysis type catheter. Stable left chest tube. Further improved left lung base ventilation. Residual reticulonodular opacity at the left lung, and in the right mid and lower lung. No pneumothorax, pulmonary edema, or pleural effusion identified. IMPRESSION: 1. Endotracheal tube tip about 15 mm above the carina. Otherwise stable lines and tubes. 2. Bilateral multifocal pneumonia suspected. Interval improved left lung base ventilation. No  pneumothorax, edema, or effusion identified. Electronically Signed   By: Genevie Ann M.D.   On: 06/13/2015 07:25   Dg Chest Port 1 View  06/12/2015  CLINICAL DATA:  Status post endotracheal tube placement EXAM: PORTABLE CHEST - 1 VIEW COMPARISON:  06/12/2015 FINDINGS: Endotracheal tube is now been placed in lies approximately 4 cm above the carina. Nasogastric catheter is noted extending into the stomach. A left-sided chest tube and dialysis catheter are again noted and stable. Patchy density is again noted throughout the right lung and left base. IMPRESSION: Status post endotracheal tube and nasogastric catheter placement in satisfactory position. The remainder of the exam is stable from the previous study. Electronically Signed   By: Inez Catalina M.D.   On: 06/12/2015 12:21   Dg Abd Portable 1v  06/12/2015  CLINICAL DATA:  Orogastric tube placement EXAM: PORTABLE ABDOMEN - 1 VIEW COMPARISON:  None. FINDINGS: Orogastric tube tip and side port are in the stomach. Bowel gas pattern is unremarkable. There is consolidation in the medial left lung base. IMPRESSION: Orogastric tube tip and side port in stomach. Bowel gas pattern unremarkable. Consolidation medial left lung base. Electronically Signed   By: Lowella Grip III M.D.   On: 06/12/2015 12:22     Medications:     . antiseptic oral rinse  7 mL Mouth Rinse q12n4p  . antiseptic oral rinse  7 mL Mouth Rinse QID  . atorvastatin  20 mg Oral q1800  . calcitRIOL  0.5 mcg Oral Daily  . cefTAZidime (FORTAZ)  IV  2 g Intravenous Q M,W,F-HD  . chlorhexidine gluconate  15 mL Mouth Rinse BID  . [START ON 06/17/2015] darbepoetin (ARANESP) injection - DIALYSIS  200 mcg Intravenous Q Mon-HD  . docusate sodium  100 mg Oral BID  . feeding supplement (NEPRO CARB STEADY)  1,000 mL Oral Q24H  . feeding supplement (PRO-STAT SUGAR FREE 64)  30 mL Per Tube QID  . ferric gluconate (FERRLECIT/NULECIT) IV  125 mg Intravenous Q M,W,F-HD  . insulin aspart  0-15  Units Subcutaneous 6 times per day  . ipratropium-albuterol  3 mL Nebulization Q6H  . methylPREDNISolone (SOLU-MEDROL) injection  40 mg Intravenous Q12H  . metoprolol tartrate  50 mg Oral BID  . multivitamin  1 tablet Oral QHS  . pantoprazole sodium  40 mg Per Tube Q1200  . sodium chloride  3 mL Intravenous Q12H  . sodium chloride  3 mL Intravenous Q12H  . succinylcholine  100 mg Intravenous Once   sodium chloride, sodium chloride, sodium chloride, sodium chloride, acetaminophen, alteplase, clonazePAM, fentaNYL (SUBLIMAZE) injection, guaiFENesin, heparin, heparin, heparin, levalbuterol, lidocaine (PF), lidocaine-prilocaine, methocarbamol, metoprolol, pentafluoroprop-tetrafluoroeth, polyethylene glycol, senna-docusate, sodium chloride  Assessment/ Plan:   Necrotizing klebsiella pneumonia With COPD and hypoxia -- Bronchoscopy and chest CT are performed for further evaluation to explain degree of hypoxia. No PE   Congestive heart failure with diastolic dysfunction ECHO : 02/03/13  ESRD Treated with dialysis  plan HD 12/24  Anemia Hb 8.1 Treated with Aranesp 269mcg weekly  Palliative care meeting  planned     LOS: 7 Terry [name] W @TODAY @8 :52 AM

## 2015-06-15 ENCOUNTER — Inpatient Hospital Stay (HOSPITAL_COMMUNITY): Payer: Medicare PPO

## 2015-06-15 DIAGNOSIS — J150 Pneumonia due to Klebsiella pneumoniae: Secondary | ICD-10-CM

## 2015-06-15 LAB — CBC
HCT: 27.1 % — ABNORMAL LOW (ref 39.0–52.0)
HEMOGLOBIN: 8.3 g/dL — AB (ref 13.0–17.0)
MCH: 30.9 pg (ref 26.0–34.0)
MCHC: 30.6 g/dL (ref 30.0–36.0)
MCV: 100.7 fL — ABNORMAL HIGH (ref 78.0–100.0)
PLATELETS: 254 10*3/uL (ref 150–400)
RBC: 2.69 MIL/uL — AB (ref 4.22–5.81)
RDW: 22.2 % — ABNORMAL HIGH (ref 11.5–15.5)
WBC: 15.9 10*3/uL — ABNORMAL HIGH (ref 4.0–10.5)

## 2015-06-15 LAB — BASIC METABOLIC PANEL
Anion gap: 15 (ref 5–15)
BUN: 125 mg/dL — AB (ref 6–20)
CHLORIDE: 97 mmol/L — AB (ref 101–111)
CO2: 27 mmol/L (ref 22–32)
CREATININE: 5.77 mg/dL — AB (ref 0.61–1.24)
Calcium: 8.6 mg/dL — ABNORMAL LOW (ref 8.9–10.3)
GFR, EST AFRICAN AMERICAN: 10 mL/min — AB (ref >60)
GFR, EST NON AFRICAN AMERICAN: 8 mL/min — AB (ref >60)
Glucose, Bld: 169 mg/dL — ABNORMAL HIGH (ref 65–99)
Potassium: 3.7 mmol/L (ref 3.5–5.1)
SODIUM: 139 mmol/L (ref 135–145)

## 2015-06-15 LAB — PROTIME-INR
INR: 1.27 (ref 0.00–1.49)
PROTHROMBIN TIME: 16.1 s — AB (ref 11.6–15.2)

## 2015-06-15 LAB — GLUCOSE, CAPILLARY
Glucose-Capillary: 158 mg/dL — ABNORMAL HIGH (ref 65–99)
Glucose-Capillary: 170 mg/dL — ABNORMAL HIGH (ref 65–99)

## 2015-06-15 MED ORDER — CHLORHEXIDINE GLUCONATE 0.12% ORAL RINSE (MEDLINE KIT)
15.0000 mL | Freq: Two times a day (BID) | OROMUCOSAL | Status: DC
  Administered 2015-06-15 – 2015-07-03 (×24): 15 mL via OROMUCOSAL

## 2015-06-15 MED ORDER — ANTISEPTIC ORAL RINSE SOLUTION (CORINZ)
7.0000 mL | Freq: Four times a day (QID) | OROMUCOSAL | Status: DC
  Administered 2015-06-15 – 2015-06-17 (×10): 7 mL via OROMUCOSAL

## 2015-06-15 NOTE — Procedures (Signed)
I have seen and examined this patient and agree with the plan of care  Present at dialysis session   [facility] W 06/15/2015, 1:01 PM

## 2015-06-15 NOTE — Progress Notes (Signed)
PULMONARY / CRITICAL CARE MEDICINE   Name: Terry [name] MRN: [id number] DOB: 04-20-1935    ADMISSION DATE:  06/07/2015 CONSULTATION DATE:  06/07/15  REFERRING MD:  Terry [name]  CHIEF COMPLAINT:  Shortness of breath  HISTORY OF PRESENT ILLNESS:   80yo male with hx ESRD, AFib, COPD, CVA presented 12/16 to the ED with a 2 day history of progressive SOB and cough with yellow sputum. Had worsening respiratory status while at HD and EMS was called.PNA vent  SUBJECTIVE:  Weaning on CPAP. Follows commands.   VITAL SIGNS: BP 119/52 mmHg  Pulse 88  Temp(Src) 98.5 F (36.9 C) (Oral)  Resp 16  Ht 5\' 9"  (1.753 m)  Wt 174 lb 2.6 oz (79 kg)  BMI 25.71 kg/m2  SpO2 100%  HEMODYNAMICS:    VENTILATOR SETTINGS: Vent Mode:  [-] CPAP;PSV FiO2 (%):  [40 %] 40 % Set Rate:  [16 bmp] 16 bmp Vt Set:  [550 mL] 550 mL PEEP:  [5 cmH20] 5 cmH20 Pressure Support:  [5 cmH20] 5 cmH20 Plateau Pressure:  [13 cmH20-21 cmH20] 21 cmH20  INTAKE / OUTPUT: I/O last 3 completed shifts: In: 1482.5 [I.V.:123; NG/GT:1059.5; IV Piggyback:300] Out: 0   PHYSICAL EXAMINATION: General: chronically ill appearing man, on vent, NAD Neuro: awake, follows commands, moves all extremities  CV:  RRR, no m/g/r, R chest perm cath, L chest tube  PULM: coarse, rhonchi GI: BS+, soft, non tender Extremities:  1+ BLE edema   LABS:  BMET  Recent Labs Lab 06/12/15 1550 06/13/15 1338 06/15/15 0444  NA 137 138 139  K 5.8* 4.2 3.7  CL 98* 97* 97*  CO2 24 28 27   BUN 102* 64* 125*  CREATININE 7.21* 4.32* 5.77*  GLUCOSE 147* 200* 169*    Electrolytes  Recent Labs Lab 06/09/15 2310  06/10/15 1034 06/10/15 2255  06/12/15 1550 06/13/15 1338 06/15/15 0444  CALCIUM  --   < >  --   --   < > 8.0* 8.3* 8.6*  MG 2.3  --  2.4 2.3  --   --   --   --   PHOS 5.2*  < > 6.2* 5.2*  --  9.0* 5.5*  --   < > = values in this interval not displayed.  CBC  Recent Labs Lab 06/13/15 0227 06/14/15 0256 06/15/15 0444  WBC  13.2* 15.0* 15.9*  HGB 8.1* 8.5* 8.3*  HCT 26.6* 27.6* 27.1*  PLT 278 248 254    Coag's  Recent Labs Lab 06/13/15 0227 06/14/15 0256 06/15/15 0444  INR 2.22* 1.55* 1.27    Sepsis Markers No results for input(s): LATICACIDVEN, PROCALCITON, O2SATVEN in the last 168 hours.  ABG  Recent Labs Lab 06/10/15 1419 06/11/15 1255  PHART 7.385 7.342*  PCO2ART 47.5* 48.9*  PO2ART 64.0* 66.5*    Liver Enzymes  Recent Labs Lab 06/10/15 0150 06/12/15 1550 06/13/15 1338  ALBUMIN 2.7* 2.7* 2.7*    Cardiac Enzymes  Recent Labs Lab 06/09/15 0600 06/09/15 1105 06/09/15 1620  TROPONINI 0.06* 0.16* 0.08*    Glucose  Recent Labs Lab 06/14/15 0355 06/14/15 0739 06/14/15 1121 06/14/15 1624 06/14/15 1928 06/14/15 2344  GLUCAP 190* 144* 150* 198* 144* 170*    Imaging No results found.   STUDIES:  CT chest as noted: No PE.Multifocal pna r>l, minimal pnx  CULTURES: Urine 12/16>>> no UOP BC x 2 12/16>>>GNR>> Sputum 12/16>>>kleb, int ceftriaxone  ANTIBIOTICS: vanc 12/16>>>12/19 Cefepime 12/16>>>12/19 ceftaz 12/19>>> Flagyl 12/23>>  SIGNIFICANT EVENTS: 12/16 intubated, L tension ptx post  intubation, chest tube placed in ER  Extubated 12/19- worsening distress, HD, bipap, small PTX resolve dafter chest tube back to suction 12/20- weak resp status remains, failed high flow nasal cannula O2 reservoir  12/21- required intubation, bronch w/ bloody clots, no DAH  LINES/TUBES: ETT 12/16>>>12/18 R chest perm cath >>> L chest tube 12/16>>>  DISCUSSION: 80yo CM presented to ED, assessed for SOB. Intubated in ED for worsening respiratory distress and increased oxygen requirements. Treated for differential which includes HCAP, viral illness, COPD exacerbation, and acute on chronic CHF c/b inadequate volume removal with incomplete HD.   ASSESSMENT / PLAN:  PULMONARY A: Acute hypoxemic respiratory failure 2/2 HCAP vs COPD exacerbation vs. CHF exacerbation AECOPD   L PTX- resolved Concern that pcxr does not explain hypoxia Klebsiella PNA - necrotizing? P:   Weaning on CPAP ps 5 Chest tube to suction remain, no air leak pcxr w/ bilateral multifocal PNA Solumedrol 40 mg q12h   CARDIOVASCULAR HTN  CAD  AFib on coumadin  Possible Acute on Chronic Diastolic heart failure, documented on Echo 02/03/13 P:  Home metoprolol Coumadin on hold due to bloody resp secretions   RENAL A:  ESRD on HD P:   Chem in AM HD per renal Renal following   GASTROINTESTINAL A: r/o dysphagia- passed SLP eval P:   TF PPI   HEMATOLOGIC  Recent Labs  06/14/15 0256 06/15/15 0444  HGB 8.5* 8.3*    A:  Anemia of chronic disease Leukocytosis hemoconcetartion bleeding on bronch P:  CBC in AM Heparin/coumadin on hold due to blood clots found on bronch  INFECTIOUS A:  Sepsis 2/2 HCAP, r/;o necrotizing process ID now doing ABX P:   ceftaz to stop date Flagyl added 12/23 BC with GNR Klebsiella growing in resp cx  ENDOCRINE CBG (last 3)   Recent Labs  06/14/15 1624 06/14/15 1928 06/14/15 2344  GLUCAP 198* 144* 170*     A:  Mild hyperglycemia  P:   SSI Glucose monitoring   NEUROLOGIC A:  Hx of CVA, sedation for mechanical ventilation P:   PT needed when resp better Off sedation currently  FAMILY  - Updates: no family available  - Inter-disciplinary family meet or Palliative Care meeting due by:  12/24   Terry [name] ACNP Terry [name] PCCM Pager [phone] till 3 pm If no answer page [phone] 06/15/2015, 8:47 AM  Attending Note:  80 year old with Klebsiella necrotizing pneumonia and a spontaneous PTX with CT in place on left.  On exam patient is awake and interactive, moving all ext to command.  Lungs coarse.  Patient is weaning well but not willing to extubate.  Will continue weaning today, HD today with volume negative and will extubate in AM.  The patient is critically ill with multiple organ systems failure and requires high  complexity decision making for assessment and support, frequent evaluation and titration of therapies, application of advanced monitoring technologies and extensive interpretation of multiple databases.   Critical Care Time devoted to patient care services described in this note is  35  Minutes. This time reflects time of care of this signee Dr Terry [name]. This critical care time does not reflect procedure time, or teaching time or supervisory time of PA/NP/Med student/Med Resident etc but could involve care discussion time.  Rush Farmer, M.D. [facility] Pulmonary/Critical Care Medicine. Pager: [phone]. After hours pager: [phone].

## 2015-06-15 NOTE — Progress Notes (Signed)
Patient ID: Terry [name], male   DOB: 1934-08-27, 80 y.o.   MRN: [id number]         [facility] for Infectious Disease    Date of Admission:  06/07/2015   Total days of antibiotics 9        Day 6 ceftazidime        Day 2 metronidazole          Principal Problem:   HCAP (healthcare-associated pneumonia) Active Problems:   COPD GOLD III   CAD (coronary artery disease) of artery bypass graft   Essential hypertension   Acute respiratory failure with hypoxia (HCC)   ESRD (end stage renal disease) ([place])   Chronic atrial fibrillation ([place])   Sepsis ([place])   Acute hypoxemic respiratory failure ([place])   Chest tube in place   Encounter for intubation   Necrotizing pneumonia ([place])   Pneumonia due to Klebsiella pneumoniae ([place])   Encounter for orogastric (OG) tube placement   . antiseptic oral rinse  7 mL Mouth Rinse q12n4p  . antiseptic oral rinse  7 mL Mouth Rinse QID  . atorvastatin  20 mg Oral q1800  . calcitRIOL  0.5 mcg Oral Daily  . cefTAZidime (FORTAZ)  IV  2 g Intravenous Q T,Th,Sa-HD  . chlorhexidine gluconate  15 mL Mouth Rinse BID  . [START ON 06/17/2015] darbepoetin (ARANESP) injection - DIALYSIS  200 mcg Intravenous Q Mon-HD  . docusate sodium  100 mg Oral BID  . feeding supplement (NEPRO CARB STEADY)  1,000 mL Oral Q24H  . feeding supplement (PRO-STAT SUGAR FREE 64)  30 mL Per Tube QID  . ferric gluconate (FERRLECIT/NULECIT) IV  125 mg Intravenous Q T,Th,Sa-HD  . heparin subcutaneous  5,000 Units Subcutaneous 3 times per day  . insulin aspart  0-15 Units Subcutaneous 6 times per day  . ipratropium-albuterol  3 mL Nebulization Q6H  . metoprolol tartrate  50 mg Oral BID  . metronidazole  500 mg Intravenous Q8H  . multivitamin  1 tablet Oral QHS  . pantoprazole sodium  40 mg Per Tube Q1200  . predniSONE  20 mg Oral Q breakfast  . sodium chloride  3 mL Intravenous Q12H  . sodium chloride  3 mL Intravenous Q12H    ROS  OBJECTIVE: Filed Vitals:   06/15/15 1100  06/15/15 1200 06/15/15 1203 06/15/15 1214  BP: 128/49 120/60  120/60  Pulse: 100 97  102  Temp:   98.3 F (36.8 C)   TempSrc:  Oral Oral   Resp: 18 17  20   Height:      Weight:      SpO2: 95% 94%  96%   Body mass index is 25.71 kg/(m^2).  Physical Exam  Lab Results Lab Results  Component Value Date   WBC 15.9* 06/15/2015   HGB 8.3* 06/15/2015   HCT 27.1* 06/15/2015   MCV 100.7* 06/15/2015   PLT 254 06/15/2015    Lab Results  Component Value Date   CREATININE 5.77* 06/15/2015   BUN 125* 06/15/2015   NA 139 06/15/2015   K 3.7 06/15/2015   CL 97* 06/15/2015   CO2 27 06/15/2015    Lab Results  Component Value Date   ALT 13* 06/08/2015   AST 27 06/08/2015   ALKPHOS 56 06/08/2015   BILITOT 0.8 06/08/2015     Microbiology: Recent Results (from the past 240 hour(s))  Culture, blood (routine x 2)     Status: None   Collection Time: 06/07/15  9:29 PM  Result Value Ref Range Status   Specimen Description BLOOD LEFT FOREARM  Final   Special Requests BOTTLES DRAWN AEROBIC ONLY 6CCS  Final   Culture NO GROWTH 5 DAYS  Final   Report Status 06/12/2015 FINAL  Final  Culture, blood (routine x 2)     Status: None   Collection Time: 06/07/15  9:38 PM  Result Value Ref Range Status   Specimen Description BLOOD LEFT HAND  Final   Special Requests BOTTLES DRAWN AEROBIC ONLY 4CCS  Final   Culture NO GROWTH 5 DAYS  Final   Report Status 06/12/2015 FINAL  Final  Culture, respiratory (tracheal aspirate)     Status: None   Collection Time: 06/08/15  2:19 AM  Result Value Ref Range Status   Specimen Description TRACHEAL ASPIRATE  Final   Special Requests Normal  Final   Gram Stain   Final    NO WBC SEEN NO SQUAMOUS EPITHELIAL CELLS SEEN NO ORGANISMS SEEN Performed at [facility]    Culture   Final    FEW KLEBSIELLA OXYTOCA Performed at [facility]    Report Status 06/10/2015 FINAL  Final   Organism ID, Bacteria KLEBSIELLA OXYTOCA  Final       Susceptibility   Klebsiella oxytoca - MIC*    AMPICILLIN >=32 RESISTANT Resistant     AMPICILLIN/SULBACTAM >=32 RESISTANT Resistant     CEFAZOLIN >=64 RESISTANT Resistant     CEFEPIME <=1 SENSITIVE Sensitive     CEFTAZIDIME <=1 SENSITIVE Sensitive     CEFTRIAXONE 2 INTERMEDIATE Intermediate     CIPROFLOXACIN <=0.25 SENSITIVE Sensitive     GENTAMICIN <=1 SENSITIVE Sensitive     IMIPENEM <=0.25 SENSITIVE Sensitive     PIP/TAZO >=128 RESISTANT Resistant     TOBRAMYCIN <=1 SENSITIVE Sensitive     TRIMETH/SULFA Value in next row Sensitive      <=20 SENSITIVE(NOTE)    * FEW KLEBSIELLA OXYTOCA  Culture, blood (x 2)     Status: None   Collection Time: 06/08/15  2:57 AM  Result Value Ref Range Status   Specimen Description BLOOD LEFT HAND  Final   Special Requests IN PEDIATRIC BOTTLE 2ML  Final   Culture NO GROWTH 5 DAYS  Final   Report Status 06/13/2015 FINAL  Final  Culture, blood (x 2)     Status: None   Collection Time: 06/08/15  3:07 AM  Result Value Ref Range Status   Specimen Description BLOOD LEFT HAND  Final   Special Requests IN PEDIATRIC BOTTLE 1 ML  Final   Culture NO GROWTH 5 DAYS  Final   Report Status 06/13/2015 FINAL  Final  Culture, respiratory (NON-Expectorated)     Status: None (Preliminary result)   Collection Time: 06/12/15  3:03 PM  Result Value Ref Range Status   Specimen Description BRONCHIAL ALVEOLAR LAVAGE  Final   Special Requests NONE  Final   Gram Stain   Final    ABUNDANT WBC PRESENT, PREDOMINANTLY PMN NO SQUAMOUS EPITHELIAL CELLS SEEN NO ORGANISMS SEEN Performed at [facility]    Culture   Final    ABUNDANT GRAM NEGATIVE RODS Note: Susceptibilities to follow Performed at [facility]    Report Status PENDING  Incomplete    ASSESSMENT: He has bilateral necrotizing pneumonia. Initial sputum cultures on 06/08/2015 grew Klebsiella oxytoca. He required rehabilitation and bronchoscopy on 06/12/2015. Bronchoscopy showed bloody clots.  Sputum cultures obtained at that time appear to be growing a second gram-negative rod. I will  continue current antibiotic therapy pending final cultures.  PLAN: 1. Continue ceftazidime and metronidazole pending final cultures  Michel Bickers, MD [facility] for [place] [phone] pager   [phone] cell 06/15/2015, 12:55 PM

## 2015-06-16 ENCOUNTER — Inpatient Hospital Stay (HOSPITAL_COMMUNITY): Payer: Medicare PPO

## 2015-06-16 LAB — GLUCOSE, CAPILLARY
GLUCOSE-CAPILLARY: 135 mg/dL — AB (ref 65–99)
GLUCOSE-CAPILLARY: 145 mg/dL — AB (ref 65–99)
Glucose-Capillary: 146 mg/dL — ABNORMAL HIGH (ref 65–99)
Glucose-Capillary: 214 mg/dL — ABNORMAL HIGH (ref 65–99)
Glucose-Capillary: 112 mg/dL — ABNORMAL HIGH (ref 65–99)

## 2015-06-16 LAB — BASIC METABOLIC PANEL
ANION GAP: 15 (ref 5–15)
BUN: 74 mg/dL — AB (ref 6–20)
CALCIUM: 8.3 mg/dL — AB (ref 8.9–10.3)
CO2: 28 mmol/L (ref 22–32)
Chloride: 96 mmol/L — ABNORMAL LOW (ref 101–111)
Creatinine, Ser: 3.87 mg/dL — ABNORMAL HIGH (ref 0.61–1.24)
GFR calc Af Amer: 16 mL/min — ABNORMAL LOW (ref >60)
GFR calc non Af Amer: 14 mL/min — ABNORMAL LOW (ref >60)
GLUCOSE: 135 mg/dL — AB (ref 65–99)
POTASSIUM: 3.6 mmol/L (ref 3.5–5.1)
Sodium: 139 mmol/L (ref 135–145)

## 2015-06-16 LAB — CBC
HEMATOCRIT: 27.9 % — AB (ref 39.0–52.0)
Hemoglobin: 8.4 g/dL — ABNORMAL LOW (ref 13.0–17.0)
MCH: 30.7 pg (ref 26.0–34.0)
MCHC: 30.1 g/dL (ref 30.0–36.0)
MCV: 101.8 fL — AB (ref 78.0–100.0)
Platelets: 241 10*3/uL (ref 150–400)
RBC: 2.74 MIL/uL — ABNORMAL LOW (ref 4.22–5.81)
RDW: 22.2 % — AB (ref 11.5–15.5)
WBC: 14.8 10*3/uL — AB (ref 4.0–10.5)

## 2015-06-16 LAB — BLOOD GAS, ARTERIAL
ACID-BASE EXCESS: 3.2 mmol/L — AB (ref 0.0–2.0)
BICARBONATE: 27 meq/L — AB (ref 20.0–24.0)
DRAWN BY: 441661
FIO2: 0.4
LHR: 16 {breaths}/min
MECHVT: 550 mL
O2 SAT: 97.2 %
PEEP/CPAP: 5 cmH2O
PH ART: 7.442 (ref 7.350–7.450)
PO2 ART: 90.3 mmHg (ref 80.0–100.0)
Patient temperature: 98.5
TCO2: 28.3 mmol/L (ref 0–100)
pCO2 arterial: 40.2 mmHg (ref 35.0–45.0)

## 2015-06-16 LAB — PROTIME-INR
INR: 1.21 (ref 0.00–1.49)
Prothrombin Time: 15.5 seconds — ABNORMAL HIGH (ref 11.6–15.2)

## 2015-06-16 LAB — MAGNESIUM: Magnesium: 2.2 mg/dL (ref 1.7–2.4)

## 2015-06-16 LAB — PHOSPHORUS: Phosphorus: 6.2 mg/dL — ABNORMAL HIGH (ref 2.5–4.6)

## 2015-06-16 MED ORDER — IPRATROPIUM-ALBUTEROL 0.5-2.5 (3) MG/3ML IN SOLN
3.0000 mL | Freq: Three times a day (TID) | RESPIRATORY_TRACT | Status: DC
  Administered 2015-06-16 – 2015-06-27 (×28): 3 mL via RESPIRATORY_TRACT
  Filled 2015-06-16 (×30): qty 3

## 2015-06-16 MED ORDER — DEXTROSE 5 % IV SOLN
1.0000 g | INTRAVENOUS | Status: DC
  Filled 2015-06-16: qty 1

## 2015-06-16 MED ORDER — DEXTROSE 5 % IV SOLN
2.0000 g | Freq: Once | INTRAVENOUS | Status: AC
  Administered 2015-06-16: 2 g via INTRAVENOUS
  Filled 2015-06-16: qty 2

## 2015-06-16 MED ORDER — TIGECYCLINE 50 MG IV SOLR
100.0000 mg | Freq: Once | INTRAVENOUS | Status: AC
  Administered 2015-06-16: 100 mg via INTRAVENOUS
  Filled 2015-06-16: qty 100

## 2015-06-16 MED ORDER — TIGECYCLINE 50 MG IV SOLR
50.0000 mg | Freq: Two times a day (BID) | INTRAVENOUS | Status: DC
  Administered 2015-06-17 – 2015-06-18 (×3): 50 mg via INTRAVENOUS
  Filled 2015-06-16 (×4): qty 100

## 2015-06-16 NOTE — Progress Notes (Addendum)
PULMONARY / CRITICAL CARE MEDICINE   Name: Terry [name] MRN: [id number] DOB: 29-Oct-1934    ADMISSION DATE:  06/07/2015 CONSULTATION DATE:  06/07/15  REFERRING MD:  Vanita Panda  CHIEF COMPLAINT:  Shortness of breath  HISTORY OF PRESENT ILLNESS:   80yo male with hx ESRD, AFib, COPD, CVA presented 12/16 to the ED with a 2 day history of progressive SOB and cough with yellow sputum. Had worsening respiratory status while at HD and EMS was called.PNA vent  SUBJECTIVE:  Weaning on CPAP. Follows commands.   VITAL SIGNS: BP 158/60 mmHg  Pulse 100  Temp(Src) 98.6 F (37 C) (Oral)  Resp 26  Ht 5\' 9"  (1.753 m)  Wt 171 lb 15.3 oz (78 kg)  BMI 25.38 kg/m2  SpO2 96%  HEMODYNAMICS:    VENTILATOR SETTINGS: Vent Mode:  [-] PSV;CPAP FiO2 (%):  [40 %] 40 % Set Rate:  [16 bmp] 16 bmp Vt Set:  [550 mL] 550 mL PEEP:  [5 cmH20] 5 cmH20 Pressure Support:  [5 cmH20] 5 cmH20 Plateau Pressure:  [19 cmH20-24 cmH20] 24 cmH20  INTAKE / OUTPUT: I/O last 3 completed shifts: In: 1990 [I.V.:350; NG/GT:1080; IV Piggyback:560] Out: 54 [Other:1710]  PHYSICAL EXAMINATION: General: chronically ill appearing man, on vent, NAD Neuro: awake, follows commands, moves all extremities (doesnt want to be extubated!) CV:  RRR, no m/g/r, R chest perm cath, L chest tube  PULM: coarse, rhonchi GI: BS+, soft, non tender Extremities:  1+ BLE edema   LABS:  BMET  Recent Labs Lab 06/13/15 1338 06/15/15 0444 06/16/15 0303  NA 138 139 139  K 4.2 3.7 3.6  CL 97* 97* 96*  CO2 28 27 28   BUN 64* 125* 74*  CREATININE 4.32* 5.77* 3.87*  GLUCOSE 200* 169* 135*    Electrolytes  Recent Labs Lab 06/10/15 1034 06/10/15 2255  06/12/15 1550 06/13/15 1338 06/15/15 0444 06/16/15 0303  CALCIUM  --   --   < > 8.0* 8.3* 8.6* 8.3*  MG 2.4 2.3  --   --   --   --  2.2  PHOS 6.2* 5.2*  --  9.0* 5.5*  --  6.2*  < > = values in this interval not displayed.  CBC  Recent Labs Lab 06/14/15 0256  06/15/15 0444 06/16/15 0303  WBC 15.0* 15.9* 14.8*  HGB 8.5* 8.3* 8.4*  HCT 27.6* 27.1* 27.9*  PLT 248 254 241    Coag's  Recent Labs Lab 06/14/15 0256 06/15/15 0444 06/16/15 0303  INR 1.55* 1.27 1.21    Sepsis Markers No results for input(s): LATICACIDVEN, PROCALCITON, O2SATVEN in the last 168 hours.  ABG  Recent Labs Lab 06/10/15 1419 06/11/15 1255 06/16/15 0533  PHART 7.385 7.342* 7.442  PCO2ART 47.5* 48.9* 40.2  PO2ART 64.0* 66.5* 90.3    Liver Enzymes  Recent Labs Lab 06/10/15 0150 06/12/15 1550 06/13/15 1338  ALBUMIN 2.7* 2.7* 2.7*    Cardiac Enzymes  Recent Labs Lab 06/09/15 1105 06/09/15 1620  TROPONINI 0.16* 0.08*    Glucose  Recent Labs Lab 06/14/15 1624 06/14/15 1928 06/14/15 2344 06/15/15 2035 06/16/15 0028 06/16/15 0406  GLUCAP 198* 144* 170* 158* 145* 135*    Imaging Dg Chest Port 1 View  06/16/2015  CLINICAL DATA:  Pneumonia.  Intubated. EXAM: PORTABLE CHEST 1 VIEW COMPARISON:  06/15/2015 FINDINGS: The endotracheal tube tip is above the carina. There is a a right IJ catheter with tip in the cavoatrial junction. The nasogastric tube tip is in the stomach. Left chest tube is  in place. No significant pneumothorax. Airspace opacities within the right midlung and right base are unchanged. IMPRESSION: 1. No change in right midlung and right base airspace opacities compatible with pneumonia. Electronically Signed   By: Kerby Moors M.D.   On: 06/16/2015 07:52     STUDIES:  CT chest as noted: No PE.Multifocal pna r>l, minimal pnx  CULTURES: Urine 12/16>>> no UOP BC x 2 12/16>>>GNR>>>Carbapenem resistant klebsiella  Sputum 12/16>>>kleb, int ceftriaxone  ANTIBIOTICS: vanc 12/16>>>12/19 Cefepime 12/16>>>12/19 ceftaz 12/19>>>12/25 Flagyl 12/23>>12/25 Tygicycline 12/25>>> Aztreonam 12/25>>>  SIGNIFICANT EVENTS: 12/16 intubated, L tension ptx post intubation, chest tube placed in ER  Extubated 12/19- worsening distress,  HD, bipap, small PTX resolve dafter chest tube back to suction 12/20- weak resp status remains, failed high flow nasal cannula O2 reservoir  12/21- required intubation, bronch w/ bloody clots, no DAH  LINES/TUBES: ETT 12/16>>>12/18>>>12/21>>>12/25 R chest perm cath >>> L chest tube 12/16>>>  DISCUSSION: 80yo CM presented to ED, assessed for SOB. Intubated in ED for worsening respiratory distress and increased oxygen requirements. Treated for differential which includes HCAP, viral illness, COPD exacerbation, and acute on chronic CHF c/b inadequate volume removal with incomplete HD.   ASSESSMENT / PLAN:  PULMONARY A: Acute hypoxemic respiratory failure 2/2 HCAP vs COPD exacerbation vs. CHF exacerbation AECOPD  L PTX- resolved Concern that pcxr does not explain hypoxia Klebsiella PNA - necrotizing? P:   Weaning well, will extubate. Chest tube to suction remain, no air leak pcxr w/ bilateral multifocal PNA Solumedrol 40 mg q12h  He does not want to be extubated due to fear of failing again. 12/25. He appears to be ready to extubate.  CARDIOVASCULAR HTN  CAD  AFib on coumadin  Possible Acute on Chronic Diastolic heart failure, documented on Echo 02/03/13 P:  Home metoprolol Coumadin on hold due to bloody resp secretions   RENAL A:  ESRD on HD P:   Chem daily HD per renal Renal following   GASTROINTESTINAL A: r/o dysphagia- passed SLP eval P:   TF PPI   HEMATOLOGIC  Recent Labs  06/15/15 0444 06/16/15 0303  HGB 8.3* 8.4*    A:  Anemia of chronic disease Leukocytosis hemoconcetartion bleeding on bronch P:  CBC daily Heparin/coumadin on hold due to blood clots found on bronch  INFECTIOUS A:  Sepsis 2/2 HCAP, r/;o necrotizing process ID now doing ABX P:   ceftaz to stop date Flagyl added 12/23 BC with GNR Klebsiella growing in resp cx  ENDOCRINE CBG (last 3)   Recent Labs  06/15/15 2035 06/16/15 0028 06/16/15 0406  GLUCAP 158* 145* 135*      A:  Mild hyperglycemia  P:   SSI Glucose monitoring   NEUROLOGIC A:  Hx of CVA, sedation for mechanical ventilation P:   PT needed when resp better Off sedation currently Awake and alert  FAMILY  - Updates: no family available  - Inter-disciplinary family meet or Palliative Care meeting due by:  12/24   [name] Minor ACNP [facility] Pager [phone] till 3 pm If no answer page [phone] 06/16/2015, 8:25 AM   Attending Note:  80 year old with Klebsiella necrotizing pneumonia carbapenem resistant and a spontaneous PTX with CT in place on left. On exam patient is awake and interactive, moving all ext to command. Lungs coarse. Patient is weaning well, extubate today.  Titrate O2 for sat of 88-92%, ambulate, swallow evaluation.  The patient is critically ill with multiple organ systems failure and requires high complexity decision making  for assessment and support, frequent evaluation and titration of therapies, application of advanced monitoring technologies and extensive interpretation of multiple databases.   Critical Care Time devoted to patient care services described in this note is 35 Minutes. This time reflects time of care of this signee Dr Jennet Maduro. This critical care time does not reflect procedure time, or teaching time or supervisory time of PA/NP/Med student/Med Resident etc but could involve care discussion time.  Rush Farmer, M.D. [facility] Pulmonary/Critical Care Medicine. Pager: [phone]. After hours pager: [phone].

## 2015-06-16 NOTE — Progress Notes (Addendum)
Patient ID: Terry [name], male   DOB: January 04, 1935, 80 y.o.   MRN: [id number]         [facility] for Infectious Disease    Date of Admission:  06/07/2015   Total days of antibiotics 10        Day 7 ceftazidime        Day 3 metronidazole          Principal Problem:   HCAP (healthcare-associated pneumonia) Active Problems:   COPD GOLD III   CAD (coronary artery disease) of artery bypass graft   Essential hypertension   Acute respiratory failure with hypoxia (HCC)   ESRD (end stage renal disease) ([place])   Chronic atrial fibrillation ([place])   Sepsis ([place])   Acute hypoxemic respiratory failure ([place])   Chest tube in place   Encounter for intubation   Necrotizing pneumonia ([place])   Pneumonia due to Klebsiella pneumoniae ([place])   Encounter for orogastric (OG) tube placement   . antiseptic oral rinse  7 mL Mouth Rinse q12n4p  . antiseptic oral rinse  7 mL Mouth Rinse QID  . atorvastatin  20 mg Oral q1800  . calcitRIOL  0.5 mcg Oral Daily  . cefTAZidime (FORTAZ)  IV  2 g Intravenous Q T,Th,Sa-HD  . chlorhexidine gluconate  15 mL Mouth Rinse BID  . [START ON 06/17/2015] darbepoetin (ARANESP) injection - DIALYSIS  200 mcg Intravenous Q Mon-HD  . docusate sodium  100 mg Oral BID  . feeding supplement (NEPRO CARB STEADY)  1,000 mL Oral Q24H  . feeding supplement (PRO-STAT SUGAR FREE 64)  30 mL Per Tube QID  . ferric gluconate (FERRLECIT/NULECIT) IV  125 mg Intravenous Q T,Th,Sa-HD  . heparin subcutaneous  5,000 Units Subcutaneous 3 times per day  . insulin aspart  0-15 Units Subcutaneous 6 times per day  . ipratropium-albuterol  3 mL Nebulization Q6H  . metoprolol tartrate  50 mg Oral BID  . metronidazole  500 mg Intravenous Q8H  . multivitamin  1 tablet Oral QHS  . pantoprazole sodium  40 mg Per Tube Q1200  . predniSONE  20 mg Oral Q breakfast  . sodium chloride  3 mL Intravenous Q12H  . sodium chloride  3 mL Intravenous Q12H    ROS  OBJECTIVE: Filed Vitals:   06/16/15  0800 06/16/15 0900 06/16/15 1000 06/16/15 1100  BP: 158/60 104/50 123/47 97/49  Pulse: 100 92 98 87  Temp:      TempSrc:      Resp: 26 20 22 16   Height:      Weight:      SpO2: 96% 95% 94% 94%   Body mass index is 25.38 kg/(m^2).  Physical Exam  Lab Results Lab Results  Component Value Date   WBC 14.8* 06/16/2015   HGB 8.4* 06/16/2015   HCT 27.9* 06/16/2015   MCV 101.8* 06/16/2015   PLT 241 06/16/2015    Lab Results  Component Value Date   CREATININE 3.87* 06/16/2015   BUN 74* 06/16/2015   NA 139 06/16/2015   K 3.6 06/16/2015   CL 96* 06/16/2015   CO2 28 06/16/2015    Lab Results  Component Value Date   ALT 13* 06/08/2015   AST 27 06/08/2015   ALKPHOS 56 06/08/2015   BILITOT 0.8 06/08/2015     Microbiology: Recent Results (from the past 240 hour(s))  Culture, blood (routine x 2)     Status: None   Collection Time: 06/07/15  9:29 PM  Result Value Ref  Range Status   Specimen Description BLOOD LEFT FOREARM  Final   Special Requests BOTTLES DRAWN AEROBIC ONLY 6CCS  Final   Culture NO GROWTH 5 DAYS  Final   Report Status 06/12/2015 FINAL  Final  Culture, blood (routine x 2)     Status: None   Collection Time: 06/07/15  9:38 PM  Result Value Ref Range Status   Specimen Description BLOOD LEFT HAND  Final   Special Requests BOTTLES DRAWN AEROBIC ONLY 4CCS  Final   Culture NO GROWTH 5 DAYS  Final   Report Status 06/12/2015 FINAL  Final  Culture, respiratory (tracheal aspirate)     Status: None   Collection Time: 06/08/15  2:19 AM  Result Value Ref Range Status   Specimen Description TRACHEAL ASPIRATE  Final   Special Requests Normal  Final   Gram Stain   Final    NO WBC SEEN NO SQUAMOUS EPITHELIAL CELLS SEEN NO ORGANISMS SEEN Performed at [facility]    Culture   Final    FEW KLEBSIELLA OXYTOCA Performed at [facility]    Report Status 06/10/2015 FINAL  Final   Organism ID, Bacteria KLEBSIELLA OXYTOCA  Final      Susceptibility    Klebsiella oxytoca - MIC*    AMPICILLIN >=32 RESISTANT Resistant     AMPICILLIN/SULBACTAM >=32 RESISTANT Resistant     CEFAZOLIN >=64 RESISTANT Resistant     CEFEPIME <=1 SENSITIVE Sensitive     CEFTAZIDIME <=1 SENSITIVE Sensitive     CEFTRIAXONE 2 INTERMEDIATE Intermediate     CIPROFLOXACIN <=0.25 SENSITIVE Sensitive     GENTAMICIN <=1 SENSITIVE Sensitive     IMIPENEM <=0.25 SENSITIVE Sensitive     PIP/TAZO >=128 RESISTANT Resistant     TOBRAMYCIN <=1 SENSITIVE Sensitive     TRIMETH/SULFA Value in next row Sensitive      <=20 SENSITIVE(NOTE)    * FEW KLEBSIELLA OXYTOCA  Culture, blood (x 2)     Status: None   Collection Time: 06/08/15  2:57 AM  Result Value Ref Range Status   Specimen Description BLOOD LEFT HAND  Final   Special Requests IN PEDIATRIC BOTTLE 2ML  Final   Culture NO GROWTH 5 DAYS  Final   Report Status 06/13/2015 FINAL  Final  Culture, blood (x 2)     Status: None   Collection Time: 06/08/15  3:07 AM  Result Value Ref Range Status   Specimen Description BLOOD LEFT HAND  Final   Special Requests IN PEDIATRIC BOTTLE 1 ML  Final   Culture NO GROWTH 5 DAYS  Final   Report Status 06/13/2015 FINAL  Final  Culture, respiratory (NON-Expectorated)     Status: None (Preliminary result)   Collection Time: 06/12/15  3:03 PM  Result Value Ref Range Status   Specimen Description BRONCHIAL ALVEOLAR LAVAGE  Final   Special Requests NONE  Final   Gram Stain   Final    ABUNDANT WBC PRESENT, PREDOMINANTLY PMN NO SQUAMOUS EPITHELIAL CELLS SEEN NO ORGANISMS SEEN Performed at [facility]    Culture   Final    ABUNDANT GRAM NEGATIVE RODS Note: Susceptibilities to follow Performed at [facility]    Report Status PENDING  Incomplete    ASSESSMENT: His most recent sputum culture is growing a carbepenamse producing Enterobacter cloacae (CRE). IV antibiotics tested it was sensitive only to ciprofloxacin, trimethoprim-sulfamethoxazole aminoglycosides. I will  change his antibiotic therapy to a combination of ciprofloxacin and tigecycline. I will avoid trimethoprim-sulfamethoxazole aminoglycosides because  of his renal insufficiency.  PLAN: 1. Start ciprofloxacin and tigecycline 2. Discontinue ceftazidime and metronidazole  Michel Bickers, MD [facility] for [facility] [place] [phone] pager   [phone] cell 06/16/2015, 11:43 AM  Addendum: I was notified by pharmacy of his possible allergy to fluoroquinolones. He is documented to have had itching after taking levofloxacin one year ago. I will treat with aztreonam and tigecycline. I requested supplemental susceptibility testing to aztreonam, tigecycline and colistin.  Michel Bickers, MD [facility] for Infectious [place] [facility] [phone] pager   [phone] cell 06/16/2015, 12:11 PM

## 2015-06-16 NOTE — Progress Notes (Signed)
CRITICAL VALUE ALERT  Critical value received:  Enterobacter cloacae CRE respiratory culture  Date of notification:  06/16/2015  Time of notification:  1130  Critical value read back:yes  Nurse who received alert: Tiney Rouge RN    MD notified (1st page): Dr. Michel Bickers  Time of first page: 1135  MD notified (2nd page):  Time of second page:  Responding MD: Dr. Michel Bickers  Time MD responded: [phone]

## 2015-06-16 NOTE — Procedures (Signed)
Extubation Procedure Note  Patient Details:   Name: Terry [name] DOB: 27-Nov-1934 MRN: [id number]   Airway Documentation:     Evaluation  O2 sats: stable throughout Complications: No apparent complications Patient did tolerate procedure well. Bilateral Breath Sounds: Rhonchi Suctioning: Airway Yes   Patient extubated to 2L nasal cannula per MD order.  Positive cuff leak noted.  No evidence of stridor.  Patient able to speak post extubation.  Incentive spirometry performed x4 with minimal effort.  Patient sats currently 93%.  Vitals are stable.  No apparent complications.   Philomena Doheny 06/16/2015, 12:25 PM

## 2015-06-16 NOTE — Progress Notes (Addendum)
ANTIBIOTIC CONSULT NOTE  Pharmacy Consult for Ceftazidime Indication: Pneumonia  Allergies  Allergen Reactions  . Yellow Dyes (Non-Tartrazine) Other (See Comments)    Burns arms Cough medications (tussionex and benzonatate ok)  . Levaquin [Levofloxacin In D5w] Itching    Patient Measurements: Height: 5\' 9"  (175.3 cm) Weight: 171 lb 15.3 oz (78 kg) IBW/kg (Calculated) : 70.7  Vital Signs: Temp: 98.6 F (37 C) (12/25 0755) Temp Source: Oral (12/25 0755) BP: 123/47 mmHg (12/25 1000) Pulse Rate: 98 (12/25 1000) Intake/Output from previous day: 12/24 0701 - 12/25 0700 In: 1410 [I.V.:230; NG/GT:720; IV Piggyback:460] Out: 1710  Intake/Output from this shift: Total I/O In: 120 [I.V.:30; NG/GT:90] Out: -   Labs:  Recent Labs  06/13/15 1338 06/14/15 0256 06/15/15 0444 06/16/15 0303  WBC  --  15.0* 15.9* 14.8*  HGB  --  8.5* 8.3* 8.4*  PLT  --  248 254 241  CREATININE 4.32*  --  5.77* 3.87*   Estimated Creatinine Clearance: 15.7 mL/min (by C-G formula based on Cr of 3.87). No results for input(s): VANCOTROUGH, VANCOPEAK, VANCORANDOM, GENTTROUGH, GENTPEAK, GENTRANDOM, TOBRATROUGH, TOBRAPEAK, TOBRARND, AMIKACINPEAK, AMIKACINTROU, AMIKACIN in the last 72 hours.    Medical History: Past Medical History  Diagnosis Date  . Stroke ([place]) 2012  . High cholesterol   . Hypertension   . Kidney disease     CKD4, sees [facility], considering dialysis  . COPD (chronic obstructive pulmonary disease) ([place])   . CAD (coronary artery disease) [place], [name]  . Acute and chronic respiratory failure 10/22/2012  . Osteoarthritis 10/22/2012  . Pneumonia April 2014  . Atrial fibrillation ([facility])     Medications:  Prescriptions prior to admission  Medication Sig Dispense Refill Last Dose  . albuterol (VENTOLIN HFA) 108 (90 BASE) MCG/ACT inhaler Inhale 2 puffs into the lungs every 4 (four) hours as needed for wheezing or shortness of breath. 1 Inhaler 0 PRN at Unknown time  .  amLODipine (NORVASC) 10 MG tablet Take 1 tablet (10 mg total) by mouth daily. 90 tablet 3 06/07/2015 at Unknown time  . atorvastatin (LIPITOR) 20 MG tablet Take 1 tablet (20 mg total) by mouth daily. (Patient taking differently: Take 20 mg by mouth daily at 6 PM. ) 30 tablet 6 06/06/2015 at Unknown time  . benzonatate (TESSALON) 200 MG capsule Take 1 capsule (200 mg total) by mouth 3 (three) times daily as needed for cough. 60 capsule 0 06/07/2015 at Unknown time  . budesonide-formoterol (SYMBICORT) 160-4.5 MCG/ACT inhaler Inhale 2 puffs into the lungs 2 (two) times daily. 1 Inhaler 6 06/07/2015 at Unknown time  . clonazePAM (KLONOPIN) 1 MG tablet Take 1 tablet (1 mg total) by mouth daily as needed for anxiety (for HD treatment). 30 tablet 0 06/07/2015 at Unknown time  . guaiFENesin-dextromethorphan (ROBITUSSIN DM) 100-10 MG/5ML syrup Take 5 mLs by mouth every 4 (four) hours as needed for cough. 118 mL 0 06/07/2015 at Unknown time  . HYDROcodone-acetaminophen (NORCO/VICODIN) 5-325 MG tablet Take 1 tablet by mouth every 6 (six) hours as needed for moderate pain or severe pain. 30 tablet 0 4+5 days  . ipratropium-albuterol (DUONEB) 0.5-2.5 (3) MG/3ML SOLN Take 3 mLs by nebulization 3 (three) times daily. 360 mL 3 06/06/2015 at Unknown time  . magic mouthwash SOLN Take 5 mLs by mouth 4 (four) times daily. X 10days 15 mL 1 06/06/2015 at Unknown time  . methocarbamol (ROBAXIN) 500 MG tablet Take 1 tablet (500 mg total) by mouth every 8 (eight)  hours as needed for muscle spasms. 90 tablet 1 06/05/2015  . metoprolol tartrate (LOPRESSOR) 25 MG tablet Take 1 tablet (25 mg total) by mouth 2 (two) times daily. 60 tablet 3 06/06/2015 at pm  . midodrine (PROAMATINE) 10 MG tablet Take 1 tablet (10 mg total) by mouth 3 (three) times daily with meals. 90 tablet 3 06/07/2015 at Unknown time  . polyethylene glycol (MIRALAX) packet Take 17 g by mouth daily as needed for moderate constipation. 30 each 0 PRN  .  senna-docusate (SENOKOT S) 8.6-50 MG tablet Take 2 tablets by mouth at bedtime as needed for mild constipation. 60 tablet 3 PRN  . tiotropium (SPIRIVA HANDIHALER) 18 MCG inhalation capsule INHALE THE CONTENTS OF ONE CAPSULE INTO LUNGS ONCE DAILY (Patient taking differently: Place 18 mcg into inhaler and inhale daily. ) 30 capsule 3 06/07/2015 at Unknown time  . warfarin (COUMADIN) 4 MG tablet Take 1 tablet (4 mg total) by mouth daily at 6 PM. Dose needs to be adjusted by your doctor 30 tablet 0 06/06/2015 at 700 pm   Assessment: 77 yom with ESRD. Pt is on day #10 of abx for necrotizing klebsiella pneumonia. Flagyl added 12/23.   Vancomycin 12/16 > 12/19 Cefepime 12/16 > 12/19 Ceftazidime 12/19 >> Flagyl 12/23 >>   12/17: Trach asp>>klebsiella oxytoca  12/21: Resp: GNR 12/16 blood: ngF 12/17 blood: ngF 12/21 resp: GNR -- grew while on ceftaz   Goal of Therapy:  Resolution of infection    Plan:  -Ceftazidime 2 g IV qTuThSat with HD -Flagyl 500 mg IV tid -Monitor CBC, cultures and clinical progress -F/u duration of therapy    Hughes Better, PharmD, BCPS Clinical Pharmacist Pager: [phone] 06/16/2015 10:59 AM    Patient also growing carbapenem resistant enterobacter -Stop ceftazidime and flagyl -Start Tygacil and Aztreonam -F/u CRE susceptibilities tomorrow -Discussed with ID  Harvel Quale  06/16/2015 12:14 PM

## 2015-06-16 NOTE — Progress Notes (Signed)
abg collected  

## 2015-06-16 NOTE — Progress Notes (Signed)
[facility] ROUNDING NOTE   Subjective:   Interval History: No new issues  Treated for necrotizing pneumonia  Objective:  Vital signs in last 24 hours:  Temp:  [97.7 F (36.5 C)-98.7 F (37.1 C)] 98.6 F (37 C) (12/25 0755) Pulse Rate:  [54-109] 100 (12/25 0800) Resp:  [14-26] 26 (12/25 0800) BP: (73-158)/(39-68) 158/60 mmHg (12/25 0800) SpO2:  [84 %-100 %] 96 % (12/25 0800) FiO2 (%):  [40 %] 40 % (12/25 0800) Weight:  [78 kg (171 lb 15.3 oz)-79.9 kg (176 lb 2.4 oz)] 78 kg (171 lb 15.3 oz) (12/25 0301)  Weight change: 0.9 kg (1 lb 15.8 oz) Filed Weights   06/15/15 1245 06/15/15 1715 06/16/15 0301  Weight: 79.9 kg (176 lb 2.4 oz) 78.2 kg (172 lb 6.4 oz) 78 kg (171 lb 15.3 oz)    Intake/Output: I/O last 3 completed shifts: In: 1990 [I.V.:350; NG/GT:1080; IV Piggyback:560] Out: [id number] [Other:1710]   Intake/Output this shift:  Total I/O In: 40 [I.V.:10; NG/GT:30] Out: -   chronically ill appearing male   Right IJ catheter  CVS- RRR chest perm cath, L chest tube R RS- coarse, ronchi ABD- BS present soft non-distended EXT- 1 + edema   Basic Metabolic Panel:  Recent Labs Lab 06/09/15 1105 06/09/15 2310  06/10/15 1034 06/10/15 2255 06/11/15 0550 06/12/15 1550 06/13/15 1338 06/15/15 0444 06/16/15 0303  NA  --   --   < >  --   --  137 137 138 139 139  K  --   --   < >  --   --  4.7 5.8* 4.2 3.7 3.6  CL  --   --   < >  --   --  98* 98* 97* 97* 96*  CO2  --   --   < >  --   --  26 24 28 27 28   GLUCOSE  --   --   < >  --   --  133* 147* 200* 169* 135*  BUN  --   --   < >  --   --  44* 102* 64* 125* 74*  CREATININE  --   --   < >  --   --  4.39* 7.21* 4.32* 5.77* 3.87*  CALCIUM  --   --   < >  --   --  8.9 8.0* 8.3* 8.6* 8.3*  MG 2.2 2.3  --  2.4 2.3  --   --   --   --  2.2  PHOS 4.0 5.2*  < > 6.2* 5.2*  --  9.0* 5.5*  --  6.2*  < > = values in this interval not displayed.  Liver Function Tests:  Recent Labs Lab 06/10/15 0150 06/12/15 1550  06/13/15 1338  ALBUMIN 2.7* 2.7* 2.7*   No results for input(s): LIPASE, AMYLASE in the last 168 hours. No results for input(s): AMMONIA in the last 168 hours.  CBC:  Recent Labs Lab 06/11/15 0550 06/12/15 0346 06/13/15 0227 06/14/15 0256 06/15/15 0444 06/16/15 0303  WBC 15.7* 13.8* 13.2* 15.0* 15.9* 14.8*  NEUTROABS 13.0*  --  12.0*  --   --   --   HGB 9.3* 8.2* 8.1* 8.5* 8.3* 8.4*  HCT 31.6* 26.8* 26.6* 27.6* 27.1* 27.9*  MCV 99.7 99.3 98.9 101.1* 100.7* 101.8*  PLT 346 307 278 248 254 241    Cardiac Enzymes:  Recent Labs Lab 06/09/15 1105 06/09/15 1620  TROPONINI 0.16* 0.08*    BNP: Invalid  input(s): POCBNP  CBG:  Recent Labs Lab 06/14/15 2344 06/15/15 2035 06/16/15 0028 06/16/15 0406 06/16/15 0758  GLUCAP 170* 158* 145* 135* 146*    Microbiology: Results for orders placed or performed during the hospital encounter of 06/07/15  Culture, blood (routine x 2)     Status: None   Collection Time: 06/07/15  9:29 PM  Result Value Ref Range Status   Specimen Description BLOOD LEFT FOREARM  Final   Special Requests BOTTLES DRAWN AEROBIC ONLY 6CCS  Final   Culture NO GROWTH 5 DAYS  Final   Report Status 06/12/2015 FINAL  Final  Culture, blood (routine x 2)     Status: None   Collection Time: 06/07/15  9:38 PM  Result Value Ref Range Status   Specimen Description BLOOD LEFT HAND  Final   Special Requests BOTTLES DRAWN AEROBIC ONLY 4CCS  Final   Culture NO GROWTH 5 DAYS  Final   Report Status 06/12/2015 FINAL  Final  Culture, respiratory (tracheal aspirate)     Status: None   Collection Time: 06/08/15  2:19 AM  Result Value Ref Range Status   Specimen Description TRACHEAL ASPIRATE  Final   Special Requests Normal  Final   Gram Stain   Final    NO WBC SEEN NO SQUAMOUS EPITHELIAL CELLS SEEN NO ORGANISMS SEEN Performed at [facility]    Culture   Final    FEW KLEBSIELLA OXYTOCA Performed at [facility]    Report Status 06/10/2015  FINAL  Final   Organism ID, Bacteria KLEBSIELLA OXYTOCA  Final      Susceptibility   Klebsiella oxytoca - MIC*    AMPICILLIN >=32 RESISTANT Resistant     AMPICILLIN/SULBACTAM >=32 RESISTANT Resistant     CEFAZOLIN >=64 RESISTANT Resistant     CEFEPIME <=1 SENSITIVE Sensitive     CEFTAZIDIME <=1 SENSITIVE Sensitive     CEFTRIAXONE 2 INTERMEDIATE Intermediate     CIPROFLOXACIN <=0.25 SENSITIVE Sensitive     GENTAMICIN <=1 SENSITIVE Sensitive     IMIPENEM <=0.25 SENSITIVE Sensitive     PIP/TAZO >=128 RESISTANT Resistant     TOBRAMYCIN <=1 SENSITIVE Sensitive     TRIMETH/SULFA Value in next row Sensitive      <=20 SENSITIVE(NOTE)    * FEW KLEBSIELLA OXYTOCA  Culture, blood (x 2)     Status: None   Collection Time: 06/08/15  2:57 AM  Result Value Ref Range Status   Specimen Description BLOOD LEFT HAND  Final   Special Requests IN PEDIATRIC BOTTLE 2ML  Final   Culture NO GROWTH 5 DAYS  Final   Report Status 06/13/2015 FINAL  Final  Culture, blood (x 2)     Status: None   Collection Time: 06/08/15  3:07 AM  Result Value Ref Range Status   Specimen Description BLOOD LEFT HAND  Final   Special Requests IN PEDIATRIC BOTTLE 1 ML  Final   Culture NO GROWTH 5 DAYS  Final   Report Status 06/13/2015 FINAL  Final  Culture, respiratory (NON-Expectorated)     Status: None (Preliminary result)   Collection Time: 06/12/15  3:03 PM  Result Value Ref Range Status   Specimen Description BRONCHIAL ALVEOLAR LAVAGE  Final   Special Requests NONE  Final   Gram Stain   Final    ABUNDANT WBC PRESENT, PREDOMINANTLY PMN NO SQUAMOUS EPITHELIAL CELLS SEEN NO ORGANISMS SEEN Performed at [facility]    Culture   Final    ABUNDANT [place]  Note: Susceptibilities to follow Performed at [facility] PENDING  Incomplete    Coagulation Studies:  Recent Labs  06/14/15 0256 06/15/15 0444 06/16/15 0303  LABPROT 18.6* 16.1* 15.5*  INR 1.55* 1.27 1.21     Urinalysis: No results for input(s): COLORURINE, LABSPEC, PHURINE, GLUCOSEU, HGBUR, BILIRUBINUR, KETONESUR, PROTEINUR, UROBILINOGEN, NITRITE, LEUKOCYTESUR in the last 72 hours.  Invalid input(s): APPERANCEUR    Imaging: Dg Chest Port 1 View  06/16/2015  CLINICAL DATA:  Pneumonia.  Intubated. EXAM: PORTABLE CHEST 1 VIEW COMPARISON:  06/15/2015 FINDINGS: The endotracheal tube tip is above the carina. There is a a right IJ catheter with tip in the cavoatrial junction. The nasogastric tube tip is in the stomach. Left chest tube is in place. No significant pneumothorax. Airspace opacities within the right midlung and right base are unchanged. IMPRESSION: 1. No change in right midlung and right base airspace opacities compatible with pneumonia. Electronically Signed   By: Kerby Moors M.D.   On: 06/16/2015 07:52   Dg Chest Port 1 View  06/15/2015  CLINICAL DATA:  Pneumonia, intubated, ventilatory support EXAM: PORTABLE CHEST 1 VIEW COMPARISON:  06/13/2015 FINDINGS: Endotracheal tube remains approximately 15 mm above the carina. NG tube enters the stomach with the tip not visualized. Right IJ dialysis catheter tips SVC RA junction. Left chest tube also stable in position. Cardiomegaly evident with central vascular congestion. Patchy airspace process in the right mid and lower lung. There is improved aeration of the left lower lobe. No enlarging effusion or pneumothorax. Atherosclerosis of aorta. IMPRESSION: Persistent right lung airspace process compatible with pneumonia. Improving left lower lobe aeration Stable support apparatus Electronically Signed   By: Jerilynn Mages.  Shick M.D.   On: 06/15/2015 09:21     Medications:     . antiseptic oral rinse  7 mL Mouth Rinse q12n4p  . antiseptic oral rinse  7 mL Mouth Rinse QID  . atorvastatin  20 mg Oral q1800  . calcitRIOL  0.5 mcg Oral Daily  . cefTAZidime (FORTAZ)  IV  2 g Intravenous Q T,Th,Sa-HD  . chlorhexidine gluconate  15 mL Mouth Rinse BID  .  [START ON 06/17/2015] darbepoetin (ARANESP) injection - DIALYSIS  200 mcg Intravenous Q Mon-HD  . docusate sodium  100 mg Oral BID  . feeding supplement (NEPRO CARB STEADY)  1,000 mL Oral Q24H  . feeding supplement (PRO-STAT SUGAR FREE 64)  30 mL Per Tube QID  . ferric gluconate (FERRLECIT/NULECIT) IV  125 mg Intravenous Q T,Th,Sa-HD  . heparin subcutaneous  5,000 Units Subcutaneous 3 times per day  . insulin aspart  0-15 Units Subcutaneous 6 times per day  . ipratropium-albuterol  3 mL Nebulization Q6H  . metoprolol tartrate  50 mg Oral BID  . metronidazole  500 mg Intravenous Q8H  . multivitamin  1 tablet Oral QHS  . pantoprazole sodium  40 mg Per Tube Q1200  . predniSONE  20 mg Oral Q breakfast  . sodium chloride  3 mL Intravenous Q12H  . sodium chloride  3 mL Intravenous Q12H   sodium chloride, sodium chloride, sodium chloride, sodium chloride, acetaminophen, alteplase, clonazePAM, fentaNYL (SUBLIMAZE) injection, guaiFENesin, heparin, heparin, heparin, levalbuterol, lidocaine (PF), lidocaine-prilocaine, methocarbamol, metoprolol, pentafluoroprop-tetrafluoroeth, polyethylene glycol, senna-docusate, sodium chloride, zolpidem  Assessment/ Plan:  80 year old with Klebsiella necrotizing pneumonia and a spontaneous PTX with CT in place on left. Relatively new to dialysis and started at [facility]     Necrotizing klebsiella pneumonia With COPD and hypoxia --  Bronchoscopy and chest CT are performed for further evaluation to explain degree of hypoxia. No PE   Congestive heart failure with diastolic dysfunction ECHO : 02/03/13  ESRD Treated with dialysis  plan HD MWF  Anemia Hb 8.4 Treated with Aranesp 216mcg weekly  Palliative care being contemplated     LOS: 9 Arina Torry W @TODAY @9 :54 AM

## 2015-06-17 ENCOUNTER — Inpatient Hospital Stay (HOSPITAL_COMMUNITY): Payer: Medicare PPO

## 2015-06-17 LAB — BASIC METABOLIC PANEL
ANION GAP: 18 — AB (ref 5–15)
BUN: 143 mg/dL — ABNORMAL HIGH (ref 6–20)
CHLORIDE: 98 mmol/L — AB (ref 101–111)
CO2: 23 mmol/L (ref 22–32)
Calcium: 8.2 mg/dL — ABNORMAL LOW (ref 8.9–10.3)
Creatinine, Ser: 6.2 mg/dL — ABNORMAL HIGH (ref 0.61–1.24)
GFR calc non Af Amer: 8 mL/min — ABNORMAL LOW (ref >60)
GFR, EST AFRICAN AMERICAN: 9 mL/min — AB (ref >60)
Glucose, Bld: 88 mg/dL (ref 65–99)
POTASSIUM: 4.3 mmol/L (ref 3.5–5.1)
SODIUM: 139 mmol/L (ref 135–145)

## 2015-06-17 LAB — PHOSPHORUS: PHOSPHORUS: 9.9 mg/dL — AB (ref 2.5–4.6)

## 2015-06-17 LAB — CBC
HEMATOCRIT: 28.2 % — AB (ref 39.0–52.0)
HEMOGLOBIN: 8.6 g/dL — AB (ref 13.0–17.0)
MCH: 30.1 pg (ref 26.0–34.0)
MCHC: 30.5 g/dL (ref 30.0–36.0)
MCV: 98.6 fL (ref 78.0–100.0)
Platelets: 259 10*3/uL (ref 150–400)
RBC: 2.86 MIL/uL — AB (ref 4.22–5.81)
RDW: 21.5 % — ABNORMAL HIGH (ref 11.5–15.5)
WBC: 16.7 10*3/uL — ABNORMAL HIGH (ref 4.0–10.5)

## 2015-06-17 LAB — PROTIME-INR
INR: 1.21 (ref 0.00–1.49)
PROTHROMBIN TIME: 15.5 s — AB (ref 11.6–15.2)

## 2015-06-17 LAB — GLUCOSE, CAPILLARY
GLUCOSE-CAPILLARY: 107 mg/dL — AB (ref 65–99)
GLUCOSE-CAPILLARY: 75 mg/dL (ref 65–99)
GLUCOSE-CAPILLARY: 93 mg/dL (ref 65–99)

## 2015-06-17 LAB — MAGNESIUM: MAGNESIUM: 2.6 mg/dL — AB (ref 1.7–2.4)

## 2015-06-17 MED ORDER — PENTAFLUOROPROP-TETRAFLUOROETH EX AERO
1.0000 | INHALATION_SPRAY | CUTANEOUS | Status: DC | PRN
Start: 2015-06-17 — End: 2015-06-20

## 2015-06-17 MED ORDER — LIDOCAINE HCL (PF) 1 % IJ SOLN: 5.0000 mL | INTRAMUSCULAR | Status: DC | PRN

## 2015-06-17 MED ORDER — SODIUM CHLORIDE 0.9 % IV SOLN: 100.0000 mL | INTRAVENOUS | Status: DC | PRN

## 2015-06-17 MED ORDER — DARBEPOETIN ALFA 200 MCG/0.4ML IJ SOSY
PREFILLED_SYRINGE | INTRAMUSCULAR | Status: AC
  Administered 2015-06-17: 200 ug via INTRAVENOUS
  Filled 2015-06-17: qty 0.4

## 2015-06-17 MED ORDER — LEVALBUTEROL HCL 0.63 MG/3ML IN NEBU
0.6300 mg | INHALATION_SOLUTION | RESPIRATORY_TRACT | Status: DC | PRN
  Administered 2015-06-25: 0.63 mg via RESPIRATORY_TRACT
  Filled 2015-06-17: qty 3

## 2015-06-17 MED ORDER — HEPARIN SODIUM (PORCINE) 1000 UNIT/ML DIALYSIS
1000.0000 [IU] | INTRAMUSCULAR | Status: DC | PRN
  Filled 2015-06-17: qty 1

## 2015-06-17 MED ORDER — ALTEPLASE 2 MG IJ SOLR
2.0000 mg | Freq: Once | INTRAMUSCULAR | Status: DC | PRN
  Filled 2015-06-17: qty 2

## 2015-06-17 MED ORDER — HEPARIN SODIUM (PORCINE) 1000 UNIT/ML DIALYSIS: 1000.0000 [IU] | INTRAMUSCULAR | Status: DC | PRN

## 2015-06-17 MED ORDER — DEXTROSE 5 % IV SOLN
500.0000 mg | Freq: Three times a day (TID) | INTRAVENOUS | Status: DC
  Administered 2015-06-17 – 2015-06-18 (×4): 500 mg via INTRAVENOUS
  Filled 2015-06-17 (×5): qty 0.5

## 2015-06-17 MED ORDER — LIDOCAINE-PRILOCAINE 2.5-2.5 % EX CREA: 1.0000 "application " | TOPICAL_CREAM | CUTANEOUS | Status: DC | PRN

## 2015-06-17 MED ORDER — ALTEPLASE 2 MG IJ SOLR: 2.0000 mg | Freq: Once | INTRAMUSCULAR | Status: DC | PRN

## 2015-06-17 MED ORDER — HEPARIN SODIUM (PORCINE) 1000 UNIT/ML DIALYSIS
100.0000 [IU]/kg | INTRAMUSCULAR | Status: DC | PRN
  Filled 2015-06-17: qty 9

## 2015-06-17 MED ORDER — SODIUM CHLORIDE 0.9 % IV SOLN
100.0000 mL | INTRAVENOUS | Status: DC | PRN
Start: 2015-06-17 — End: 2015-06-21

## 2015-06-17 MED ORDER — DEXTROSE 5 % IV SOLN
250.0000 mg | INTRAVENOUS | Status: DC
  Administered 2015-06-17: 250 mg via INTRAVENOUS
  Filled 2015-06-17 (×2): qty 0.25

## 2015-06-17 MED ORDER — PENTAFLUOROPROP-TETRAFLUOROETH EX AERO
1.0000 | INHALATION_SPRAY | CUTANEOUS | Status: DC | PRN
Start: 2015-06-17 — End: 2015-06-17

## 2015-06-17 NOTE — Progress Notes (Addendum)
  [facility] Progress Note   Subjective: on HD  Filed Vitals:   06/17/15 1254 06/17/15 1259 06/17/15 1315 06/17/15 1330  BP: 128/57 128/65 103/55   Pulse: 93 88 99 104  Temp: 97.5 F (36.4 C) 97.5 F (36.4 C)    TempSrc: Oral Oral    Resp: 19 19 20 20   Height:      Weight: 79.8 kg (175 lb 14.8 oz)     SpO2: 91% 92% 93% 93%    Inpatient medications: . antiseptic oral rinse  7 mL Mouth Rinse QID  . atorvastatin  20 mg Oral q1800  . [START ON 06/18/2015] aztreonam  250 mg Intravenous Q T,Th,Sa-HD  . aztreonam  500 mg Intravenous Q8H  . calcitRIOL  0.5 mcg Oral Daily  . chlorhexidine gluconate  15 mL Mouth Rinse BID  . darbepoetin (ARANESP) injection - DIALYSIS  200 mcg Intravenous Q Mon-HD  . docusate sodium  100 mg Oral BID  . ferric gluconate (FERRLECIT/NULECIT) IV  125 mg Intravenous Q T,Th,Sa-HD  . heparin subcutaneous  5,000 Units Subcutaneous 3 times per day  . ipratropium-albuterol  3 mL Nebulization TID  . metoprolol tartrate  50 mg Oral BID  . multivitamin  1 tablet Oral QHS  . predniSONE  20 mg Oral Q breakfast  . sodium chloride  3 mL Intravenous Q12H  . tigecycline (TYGACIL) IVPB  50 mg Intravenous Q12H     sodium chloride, sodium chloride, acetaminophen, alteplase, clonazePAM, fentaNYL (SUBLIMAZE) injection, guaiFENesin, heparin, heparin, heparin, levalbuterol, lidocaine (PF), lidocaine-prilocaine, methocarbamol, metoprolol, pentafluoroprop-tetrafluoroeth, polyethylene glycol, senna-docusate, zolpidem  Exam: Calm, no distress No jvd Chest bibasilar rhonchi/ rales RRR no rub Abd soft no ascites or edema No leg or UE edema Neuro is lethargic, responds well        Assessment: 1 PNA klebs and now enterobacter, per ID 2 Resp failure extubated now 3 COPD 4 PTX w chest tube 5 ESRD recent start 6 Anemia max esa 7 Afib on MTP 50 bid   Plan - HD today, not much vol to pull    Kelly Splinter MD [facility] pager [phone]     cell [phone] 06/17/2015, 2:12 PM    Recent Labs Lab 06/13/15 1338 06/15/15 0444 06/16/15 0303 06/17/15 0510  NA 138 139 139 139  K 4.2 3.7 3.6 4.3  CL 97* 97* 96* 98*  CO2 28 27 28 23   GLUCOSE 200* 169* 135* 88  BUN 64* 125* 74* 143*  CREATININE 4.32* 5.77* 3.87* 6.20*  CALCIUM 8.3* 8.6* 8.3* 8.2*  PHOS 5.5*  --  6.2* 9.9*    Recent Labs Lab 06/12/15 1550 06/13/15 1338  ALBUMIN 2.7* 2.7*    Recent Labs Lab 06/11/15 0550  06/13/15 0227  06/15/15 0444 06/16/15 0303 06/17/15 0510  WBC 15.7*  < > 13.2*  < > 15.9* 14.8* 16.7*  NEUTROABS 13.0*  --  12.0*  --   --   --   --   HGB 9.3*  < > 8.1*  < > 8.3* 8.4* 8.6*  HCT 31.6*  < > 26.6*  < > 27.1* 27.9* 28.2*  MCV 99.7  < > 98.9  < > 100.7* 101.8* 98.6  PLT 346  < > 278  < > 254 241 259  < > = values in this interval not displayed.

## 2015-06-17 NOTE — Progress Notes (Signed)
HD called to confirm if patient can safely transport upstairs to HD.  Called [facility] & Dr. Jimmy Footman confirmed that patient is cleared to leave the unit and go upstairs for HD.

## 2015-06-17 NOTE — Clinical Social Work Note (Signed)
Patient transferred to 2C05, handoff given to unit CSW, patient still would like to go to [facility] once he is medically ready for discharge and orders have been received.  Jones Broom. Ryder, MSW, [place] 06/17/2015 6:22 PM

## 2015-06-17 NOTE — Progress Notes (Signed)
Terry [name] for aztreonam, tigecycline Indication: Pneumonia  Allergies  Allergen Reactions  . Yellow Dyes (Non-Tartrazine) Other (See Comments)    Burns arms Cough medications (tussionex and benzonatate ok)  . Levaquin [Levofloxacin In D5w] Itching    Patient Measurements: Height: 5\' 9"  (175.3 cm) Weight: 177 lb 0.5 oz (80.3 kg) IBW/kg (Calculated) : 70.7  Vital Signs: Temp: 97.8 F (36.6 C) (12/26 0730) Temp Source: Oral (12/26 0730) BP: 131/56 mmHg (12/26 0700) Pulse Rate: 90 (12/26 0700) Intake/Output from previous day: 12/25 0701 - 12/26 0700 In: 590 [I.V.:220; NG/GT:120; IV Piggyback:250] Out: -  Intake/Output from this shift:    Labs:  Recent Labs  06/15/15 0444 06/16/15 0303 06/17/15 0510  WBC 15.9* 14.8* 16.7*  HGB 8.3* 8.4* 8.6*  PLT 254 241 259  CREATININE 5.77* 3.87* 6.20*   Estimated Creatinine Clearance: 9.8 mL/min (by C-G formula based on Cr of 6.2). No results for input(s): VANCOTROUGH, VANCOPEAK, VANCORANDOM, GENTTROUGH, GENTPEAK, GENTRANDOM, TOBRATROUGH, TOBRAPEAK, TOBRARND, AMIKACINPEAK, AMIKACINTROU, AMIKACIN in the last 72 hours.    Assessment: 76 yom with ESRD. Pt is on abx for necrotizing klebsiella pneumonia. Now with potential CRE Kleb Pneumo. Patient has CKD and on iHD Tu/Th/Sa  Vancomycin 12/16 > 12/19 Cefepime 12/16 > 12/19 Ceftazidime 12/19 >>12/25 Flagyl 12/23 >> 12/25 Aztreonam 12/25>> Tigecycline 12/25>>  12/17: Trach asp>>klebsiella oxytoca  12/21: Resp: GNR 12/16 blood: ngF 12/17 blood: ngF 12/21 resp: GNR -- grew while on ceftaz   Goal of Therapy:  Resolution of infection    Plan:  Change aztreonam to 500 mg q8h with an additional 250 mg after each HD session Continue tigecycline 50 mg IV q24h F/U Susceptibilities/ID recs for continued therapy  Levester Fresh, PharmD, BCPS, [facility] Clinical Pharmacist Pager [phone] 06/17/2015 8:04 AM

## 2015-06-17 NOTE — Progress Notes (Signed)
Patient ID: Terry [name], male   DOB: 1935/03/14, 80 y.o.   MRN: [id number] Patient ID: Terry [name], male   DOB: 05/16/1935, 80 y.o.   MRN: [id number]         [place] for Infectious Disease    Date of Admission:  06/07/2015   Total days of antibiotics 11        Day 2 tigecycline        Day 2 aztreonam          Principal Problem:   HCAP (healthcare-associated pneumonia) Active Problems:   COPD GOLD III   CAD (coronary artery disease) of artery bypass graft   Essential hypertension   Acute respiratory failure with hypoxia (HCC)   ESRD (end stage renal disease) ([place])   Chronic atrial fibrillation ([place])   Sepsis ([place])   Acute hypoxemic respiratory failure ([place])   Chest tube in place   Encounter for intubation   Necrotizing pneumonia ([place])   Pneumonia due to Klebsiella pneumoniae ([place])   Encounter for orogastric (OG) tube placement   . antiseptic oral rinse  7 mL Mouth Rinse QID  . atorvastatin  20 mg Oral q1800  . [START ON 06/18/2015] aztreonam  250 mg Intravenous Q T,Th,Sa-HD  . aztreonam  500 mg Intravenous Q8H  . calcitRIOL  0.5 mcg Oral Daily  . chlorhexidine gluconate  15 mL Mouth Rinse BID  . darbepoetin (ARANESP) injection - DIALYSIS  200 mcg Intravenous Q Mon-HD  . docusate sodium  100 mg Oral BID  . ferric gluconate (FERRLECIT/NULECIT) IV  125 mg Intravenous Q T,Th,Sa-HD  . heparin subcutaneous  5,000 Units Subcutaneous 3 times per day  . ipratropium-albuterol  3 mL Nebulization TID  . metoprolol tartrate  50 mg Oral BID  . multivitamin  1 tablet Oral QHS  . predniSONE  20 mg Oral Q breakfast  . sodium chloride  3 mL Intravenous Q12H  . tigecycline (TYGACIL) IVPB  50 mg Intravenous Q12H    ROS  OBJECTIVE: Filed Vitals:   06/17/15 0900 06/17/15 1000 06/17/15 1100 06/17/15 1200  BP: 146/61 131/57 151/65 120/99  Pulse: 100 89 97 107  Temp:      TempSrc:      Resp: 16 17 13 15   Height:      Weight:      SpO2: 94% 94% 93% 89%   Body mass index  is 26.13 kg/(m^2).  Physical Exam he is now extubated   Lab Results Lab Results  Component Value Date   WBC 16.7* 06/17/2015   HGB 8.6* 06/17/2015   HCT 28.2* 06/17/2015   MCV 98.6 06/17/2015   PLT 259 06/17/2015    Lab Results  Component Value Date   CREATININE 6.20* 06/17/2015   BUN 143* 06/17/2015   NA 139 06/17/2015   K 4.3 06/17/2015   CL 98* 06/17/2015   CO2 23 06/17/2015    Lab Results  Component Value Date   ALT 13* 06/08/2015   AST 27 06/08/2015   ALKPHOS 56 06/08/2015   BILITOT 0.8 06/08/2015     Microbiology: Recent Results (from the past 240 hour(s))  Culture, blood (routine x 2)     Status: None   Collection Time: 06/07/15  9:29 PM  Result Value Ref Range Status   Specimen Description BLOOD LEFT FOREARM  Final   Special Requests BOTTLES DRAWN AEROBIC ONLY 6CCS  Final   Culture NO GROWTH 5 DAYS  Final   Report Status 06/12/2015 FINAL  Final  Culture, blood (routine x 2)     Status: None   Collection Time: 06/07/15  9:38 PM  Result Value Ref Range Status   Specimen Description BLOOD LEFT HAND  Final   Special Requests BOTTLES DRAWN AEROBIC ONLY 4CCS  Final   Culture NO GROWTH 5 DAYS  Final   Report Status 06/12/2015 FINAL  Final  Culture, respiratory (tracheal aspirate)     Status: None   Collection Time: 06/08/15  2:19 AM  Result Value Ref Range Status   Specimen Description TRACHEAL ASPIRATE  Final   Special Requests Normal  Final   Gram Stain   Final    NO WBC SEEN NO SQUAMOUS EPITHELIAL CELLS SEEN NO ORGANISMS SEEN Performed at [facility]    Culture   Final    FEW KLEBSIELLA OXYTOCA Performed at [facility]    Report Status 06/10/2015 FINAL  Final   Organism ID, Bacteria KLEBSIELLA OXYTOCA  Final      Susceptibility   Klebsiella oxytoca - MIC*    AMPICILLIN >=32 RESISTANT Resistant     AMPICILLIN/SULBACTAM >=32 RESISTANT Resistant     CEFAZOLIN >=64 RESISTANT Resistant     CEFEPIME <=1 SENSITIVE Sensitive      CEFTAZIDIME <=1 SENSITIVE Sensitive     CEFTRIAXONE 2 INTERMEDIATE Intermediate     CIPROFLOXACIN <=0.25 SENSITIVE Sensitive     GENTAMICIN <=1 SENSITIVE Sensitive     IMIPENEM <=0.25 SENSITIVE Sensitive     PIP/TAZO >=128 RESISTANT Resistant     TOBRAMYCIN <=1 SENSITIVE Sensitive     TRIMETH/SULFA Value in next row Sensitive      <=20 SENSITIVE(NOTE)    * FEW KLEBSIELLA OXYTOCA  Culture, blood (x 2)     Status: None   Collection Time: 06/08/15  2:57 AM  Result Value Ref Range Status   Specimen Description BLOOD LEFT HAND  Final   Special Requests IN PEDIATRIC BOTTLE 2ML  Final   Culture NO GROWTH 5 DAYS  Final   Report Status 06/13/2015 FINAL  Final  Culture, blood (x 2)     Status: None   Collection Time: 06/08/15  3:07 AM  Result Value Ref Range Status   Specimen Description BLOOD LEFT HAND  Final   Special Requests IN PEDIATRIC BOTTLE 1 ML  Final   Culture NO GROWTH 5 DAYS  Final   Report Status 06/13/2015 FINAL  Final  Culture, respiratory (NON-Expectorated)     Status: None (Preliminary result)   Collection Time: 06/12/15  3:03 PM  Result Value Ref Range Status   Specimen Description BRONCHIAL ALVEOLAR LAVAGE  Final   Special Requests NONE  Final   Gram Stain   Final    ABUNDANT WBC PRESENT, PREDOMINANTLY PMN NO SQUAMOUS EPITHELIAL CELLS SEEN NO ORGANISMS SEEN Performed at [facility]    Culture   Final    ABUNDANT GRAM NEGATIVE RODS Note: Susceptibilities to follow Performed at [facility]    Report Status PENDING  Incomplete    ASSESSMENT: His most recent sputum culture has grown carbepenam resistant Enterobacter cloacae. I will continue tigecycline and aztreonam and await final cultures and antibiotic susceptibility testing.  PLAN: 1. Start tigecycline and aztreonam  Michel Bickers, MD [facility] for [place] [phone] pager   [phone] cell 06/17/2015, 12:48 PM

## 2015-06-17 NOTE — Progress Notes (Signed)
MBSS complete. Full report located under chart review in imaging section.  Kaydenn Mclear MA, CCC-SLP [phone]   

## 2015-06-17 NOTE — Progress Notes (Signed)
[facility] TEAM 1 - Stepdown/ICU TEAM PROGRESS NOTE  Terry [name] [id number] DOB: [birth year] DOA: 06/07/2015 PCP: [name], DO  Admit HPI / Brief Narrative: 80yo male with hx ESRD, AFib, COPD, CVA presented 12/16 to the ED via EMS with a 2 day history of progressive SOB and cough with yellow sputum. Had worsening respiratory status while at HD and EMS was called.   Significant Events: 12/16 intubated, L tension ptx post intubation, chest tube placed in ER  12/18 extubated 12/19 worsening distress, HD, bipap, small PTX resolved after chest tube back to suction 12/20 weak resp status remains, failed high flow nasal cannula O2 reservoir  12/21 required intubation, bronch w/ bloody clots, no DAH 12/25 extubated   HPI/Subjective: The pt is awake and alert.  He denies pain at rest, but does have expected chest pain w/ cough or sneeze associated w/ his CT.  He denies n/v, abdom pain, or HA.    Assessment/Plan:  Acute hypoxemic respiratory failure rapildly improving - extubated yesterday - weaned to [place] only today   Klebsiella necrotizing HCAP - Enterobacter cloacae HCAP Both organisms appear to be carbapenem resistant - abx per ID service   L PTX CT remains in place - CT management per PCCM   COPD w/ acute exacerbation  Acute exacerbation appears to have resolved - follow   HTN  BP currently reasonably controlled - follow  CAD s/p CABG Asymptomatic at this time  Chronic AFib on coumadin  Rate controlled - coumadin has been on hold due to bloody resp secretions   Chronic grade 1 Diastolic heart failure No evidence of signif volume overload at this time - volume management per HD   ESRD on HD HD per Nephrology   Anemia of chronic disease Hgb holding steady   Hx of CVA  Code Status: FULL Family Communication: no family present at time of exam Disposition Plan: SDU until chest tube removed   Consultants: Nephrology ID PCCM  Antibiotics: vanc  12/16>12/19 cefepime 12/16>12/19 ceftaz 12/19>12/25 flagyl 12/23>12/25 Tygicycline 12/25> Aztreonam 12/25>  DVT prophylaxis: SQ heparin   Objective: Blood pressure 131/56, pulse 90, temperature 97.8 F (36.6 C), temperature source Oral, resp. rate 15, height 5\' 9"  (1.753 m), weight 80.3 kg (177 lb 0.5 oz), SpO2 95 %.  Intake/Output Summary (Last 24 hours) at 06/17/15 0842 Last data filed at 06/17/15 0700  Gross per 24 hour  Intake    550 ml  Output      0 ml  Net    550 ml   Exam: General: No acute respiratory distress at rest in bed  Lungs: Clear to auscultation bilaterally without wheezes or crackles Cardiovascular: Regular rate without murmur gallop or rub normal S1 and S2 Abdomen: Nontender, nondistended, soft, bowel sounds positive, no rebound, no ascites, no appreciable mass Extremities: No significant cyanosis, clubbing, or edema bilateral lower extremities  Data Reviewed: Basic Metabolic Panel:  Recent Labs Lab 06/10/15 1034 06/10/15 2255  06/12/15 1550 06/13/15 1338 06/15/15 0444 06/16/15 0303 06/17/15 0510  NA  --   --   < > 137 138 139 139 139  K  --   --   < > 5.8* 4.2 3.7 3.6 4.3  CL  --   --   < > 98* 97* 97* 96* 98*  CO2  --   --   < > 24 28 27 28 23   GLUCOSE  --   --   < > 147* 200* 169* 135* 88  BUN  --   --   < >  102* 64* 125* 74* 143*  CREATININE  --   --   < > 7.21* 4.32* 5.77* 3.87* 6.20*  CALCIUM  --   --   < > 8.0* 8.3* 8.6* 8.3* 8.2*  MG 2.4 2.3  --   --   --   --  2.2 2.6*  PHOS 6.2* 5.2*  --  9.0* 5.5*  --  6.2* 9.9*  < > = values in this interval not displayed.  CBC:  Recent Labs Lab 06/11/15 0550  06/13/15 0227 06/14/15 0256 06/15/15 0444 06/16/15 0303 06/17/15 0510  WBC 15.7*  < > 13.2* 15.0* 15.9* 14.8* 16.7*  NEUTROABS 13.0*  --  12.0*  --   --   --   --   HGB 9.3*  < > 8.1* 8.5* 8.3* 8.4* 8.6*  HCT 31.6*  < > 26.6* 27.6* 27.1* 27.9* 28.2*  MCV 99.7  < > 98.9 101.1* 100.7* 101.8* 98.6  PLT 346  < > 278 248 254 241 259    < > = values in this interval not displayed.  Liver Function Tests:  Recent Labs Lab 06/12/15 1550 06/13/15 1338  ALBUMIN 2.7* 2.7*   Coags:  Recent Labs Lab 06/13/15 0227 06/14/15 0256 06/15/15 0444 06/16/15 0303 06/17/15 0510  INR 2.22* 1.55* 1.27 1.21 1.21   CBG:  Recent Labs Lab 06/16/15 0758 06/16/15 1254 06/16/15 1618 06/17/15 0018 06/17/15 0415  GLUCAP 146* 214* 112* 93 75    Recent Results (from the past 240 hour(s))  Culture, blood (routine x 2)     Status: None   Collection Time: 06/07/15  9:29 PM  Result Value Ref Range Status   Specimen Description BLOOD LEFT FOREARM  Final   Special Requests BOTTLES DRAWN AEROBIC ONLY 6CCS  Final   Culture NO GROWTH 5 DAYS  Final   Report Status 06/12/2015 FINAL  Final  Culture, blood (routine x 2)     Status: None   Collection Time: 06/07/15  9:38 PM  Result Value Ref Range Status   Specimen Description BLOOD LEFT HAND  Final   Special Requests BOTTLES DRAWN AEROBIC ONLY 4CCS  Final   Culture NO GROWTH 5 DAYS  Final   Report Status 06/12/2015 FINAL  Final  Culture, respiratory (tracheal aspirate)     Status: None   Collection Time: 06/08/15  2:19 AM  Result Value Ref Range Status   Specimen Description TRACHEAL ASPIRATE  Final   Special Requests Normal  Final   Gram Stain   Final    NO WBC SEEN NO SQUAMOUS EPITHELIAL CELLS SEEN NO ORGANISMS SEEN Performed at [facility]    Culture   Final    FEW KLEBSIELLA OXYTOCA Performed at [facility]    Report Status 06/10/2015 FINAL  Final   Organism ID, Bacteria KLEBSIELLA OXYTOCA  Final      Susceptibility   Klebsiella oxytoca - MIC*    AMPICILLIN >=32 RESISTANT Resistant     AMPICILLIN/SULBACTAM >=32 RESISTANT Resistant     CEFAZOLIN >=64 RESISTANT Resistant     CEFEPIME <=1 SENSITIVE Sensitive     CEFTAZIDIME <=1 SENSITIVE Sensitive     CEFTRIAXONE 2 INTERMEDIATE Intermediate     CIPROFLOXACIN <=0.25 SENSITIVE Sensitive      GENTAMICIN <=1 SENSITIVE Sensitive     IMIPENEM <=0.25 SENSITIVE Sensitive     PIP/TAZO >=128 RESISTANT Resistant     TOBRAMYCIN <=1 SENSITIVE Sensitive     TRIMETH/SULFA Value in next row Sensitive      <=  51 SENSITIVE(NOTE)    * FEW KLEBSIELLA OXYTOCA  Culture, blood (x 2)     Status: None   Collection Time: 06/08/15  2:57 AM  Result Value Ref Range Status   Specimen Description BLOOD LEFT HAND  Final   Special Requests IN PEDIATRIC BOTTLE 2ML  Final   Culture NO GROWTH 5 DAYS  Final   Report Status 06/13/2015 FINAL  Final  Culture, blood (x 2)     Status: None   Collection Time: 06/08/15  3:07 AM  Result Value Ref Range Status   Specimen Description BLOOD LEFT HAND  Final   Special Requests IN PEDIATRIC BOTTLE 1 ML  Final   Culture NO GROWTH 5 DAYS  Final   Report Status 06/13/2015 FINAL  Final  Culture, respiratory (NON-Expectorated)     Status: None (Preliminary result)   Collection Time: 06/12/15  3:03 PM  Result Value Ref Range Status   Specimen Description BRONCHIAL ALVEOLAR LAVAGE  Final   Special Requests NONE  Final   Gram Stain   Final    ABUNDANT WBC PRESENT, PREDOMINANTLY PMN NO SQUAMOUS EPITHELIAL CELLS SEEN NO ORGANISMS SEEN Performed at [facility]    Culture   Final    ABUNDANT GRAM NEGATIVE RODS Note: Susceptibilities to follow Performed at [facility]    Report Status PENDING  Incomplete     Studies:   Recent x-ray studies have been reviewed in detail by the Attending Physician  Scheduled Meds:  Scheduled Meds: . antiseptic oral rinse  7 mL Mouth Rinse QID  . atorvastatin  20 mg Oral q1800  . [START ON 06/18/2015] aztreonam  250 mg Intravenous Q T,Th,Sa-HD  . aztreonam  500 mg Intravenous Q8H  . calcitRIOL  0.5 mcg Oral Daily  . chlorhexidine gluconate  15 mL Mouth Rinse BID  . darbepoetin (ARANESP) injection - DIALYSIS  200 mcg Intravenous Q Mon-HD  . docusate sodium  100 mg Oral BID  . ferric gluconate (FERRLECIT/NULECIT)  IV  125 mg Intravenous Q T,Th,Sa-HD  . heparin subcutaneous  5,000 Units Subcutaneous 3 times per day  . insulin aspart  0-15 Units Subcutaneous 6 times per day  . ipratropium-albuterol  3 mL Nebulization TID  . metoprolol tartrate  50 mg Oral BID  . multivitamin  1 tablet Oral QHS  . pantoprazole sodium  40 mg Per Tube Q1200  . predniSONE  20 mg Oral Q breakfast  . sodium chloride  3 mL Intravenous Q12H  . sodium chloride  3 mL Intravenous Q12H  . tigecycline (TYGACIL) IVPB  50 mg Intravenous Q12H    Time spent on care of this patient: 35 mins   MCCLUNG,JEFFREY T , MD   [facility] Office  [phone] Pager - Text Page per Shea Evans as per below:  On-Call/Text Page:      [email or web address]      password [email or web address]  If 7PM-7AM, please contact night-coverage [email or web address] Password [email or web address] 06/17/2015, 8:42 AM   LOS: 10 days

## 2015-06-17 NOTE — Evaluation (Signed)
Clinical/Bedside Swallow Evaluation Patient Details  Name: Terry [name] MRN: [id number] Date of Birth: 05-Sep-1934  Today's Date: 06/17/2015 Time: SLP Start Time (ACUTE ONLY): 0855 SLP Stop Time (ACUTE ONLY): 0920 SLP Time Calculation (min) (ACUTE ONLY): 25 min  Past Medical History:  Past Medical History  Diagnosis Date  . Stroke ([place]) 2012  . High cholesterol   . Hypertension   . Kidney disease     CKD4, sees [place] [facility], considering dialysis  . COPD (chronic obstructive pulmonary disease) ([place])   . CAD (coronary artery disease) [place], [name]  . Acute and chronic respiratory failure 10/22/2012  . Osteoarthritis 10/22/2012  . Pneumonia April 2014  . Atrial fibrillation [facility])    Past Surgical History:  Past Surgical History  Procedure Laterality Date  . Coronary stent placement  1999    2 stents  . Appendectomy  1984  . Colon surgery  2012    partial colon removed - twisted bowel  . Hernia repair      Right inguinal  . Cystoscopy/retrograde/ureteroscopy Bilateral 04/23/2014    Procedure: CYSTOSCOPY BILATERAL RETROGRADE,  LEFT URETEROSCOPY, LEFT STENT PLACEMENT;  Surgeon: Raynelle Bring, MD;  Location: [facility] ORS;  Service: Urology;  Laterality: Bilateral;  . Av fistula placement Right 05/21/2015    Procedure: Right Arm Brachiocephalic ARTERIOVENOUS (AV) FISTULA CREATION;  Surgeon: Angelia Mould, MD;  Location: [facility] OR;  Service: Vascular;  Laterality: Right;   HPI:  80yo male with hx ESRD, AFib, COPD, CVA presented 12/16 to the ED with a 2 day history of progressive SOB and cough with yellow sputum. Had worsening respiratory status while at HD and EMS was called. Diagnosed with PNA vs COPD exacerbation vs CHF exacerbation. Intubated 12/16-12/18, then again 12/21-12/25.   Assessment / Plan / Recommendation Clinical Impression  Swallow evaluation complete. Patient presents with what appears to be a functional oropharyngeal swallow with good airway protection at  bedside with tested pos however has multiple risk factors for dysphagia with aspiration including multiple prolonged intubations, hoarse vocal quality indicative of decreased glottal closure, h/o dysphagia, and respiratory impairments. Recommend NPO with MBS to determine least restrictive diet. Will plan for MBS this am.     Aspiration Risk  Moderate aspiration risk    Diet Recommendation NPO   Medication Administration: Via alternative means    Other  Recommendations Oral Care Recommendations: Oral care QID             Prognosis Prognosis for Safe Diet Advancement: Good      Swallow Study   General HPI: 80yo male with hx ESRD, AFib, COPD, CVA presented 12/16 to the ED with a 2 day history of progressive SOB and cough with yellow sputum. Had worsening respiratory status while at HD and EMS was called. Diagnosed with PNA vs COPD exacerbation vs CHF exacerbation. Intubated 12/16-12/18, then again 12/21-12/25. Type of Study: Bedside Swallow Evaluation Previous Swallow Assessment: initial bedside evaluation complete 12/20 with recommendations for pureed solids, thin liquids, downgraded next day to pudding thick liquids due to decline and subsequently intubated Diet Prior to this Study: NPO Temperature Spikes Noted: No Respiratory Status: Venti-mask (switched over to nasal cannula for evaluation) History of Recent Intubation: Yes Length of Intubations (days): 6 days (x 2 intubations) Date extubated: 06/16/15 Behavior/Cognition: Alert;Cooperative;Pleasant mood Oral Cavity Assessment: Dried secretions (bloody secretions) Oral Care Completed by SLP: Yes Oral Cavity - Dentition: Poor condition;Missing dentition;Dentures, not available Vision: Functional for self-feeding Self-Feeding Abilities: Able to feed self Patient  Positioning: Upright in bed Baseline Vocal Quality: Hoarse Volitional Cough: Weak Volitional Swallow: Able to elicit    Oral/Motor/Sensory Function Overall Oral  Motor/Sensory Function: Within functional limits   Ice Chips Ice chips: Not tested   Thin Liquid Thin Liquid: Within functional limits Presentation: Cup;Self Fed    Nectar Thick Nectar Thick Liquid: Not tested   Honey Thick Honey Thick Liquid: Not tested   Puree Puree: Within functional limits Presentation: Spoon   Solid Solid: Not tested      Gabriel Rainwater MA, CCC-SLP [phone]  Mykeisha Dysert Meryl 06/17/2015,10:28 AM

## 2015-06-17 NOTE — Progress Notes (Addendum)
PT Cancellation Note  Patient Details Name: Terry [name] MRN: [id number] DOB: 1935/03/12   Cancelled Treatment:    Reason Eval/Treat Not Completed: Patient at procedure or test/unavailable.  Pt down having a MBS on arrival.  Will reattempt to see pt today as able.  14:30   Pt in HD will see as able 12/27. 06/17/2015  Donnella Sham, PT [phone]  (pager)   [name], [name] 06/17/2015, 11:18 AM

## 2015-06-18 DIAGNOSIS — J156 Pneumonia due to other aerobic Gram-negative bacteria: Secondary | ICD-10-CM

## 2015-06-18 LAB — COMPREHENSIVE METABOLIC PANEL
ALT: 23 U/L (ref 17–63)
ANION GAP: 17 — AB (ref 5–15)
AST: 27 U/L (ref 15–41)
Albumin: 2.8 g/dL — ABNORMAL LOW (ref 3.5–5.0)
Alkaline Phosphatase: 56 U/L (ref 38–126)
BUN: 73 mg/dL — ABNORMAL HIGH (ref 6–20)
CHLORIDE: 95 mmol/L — AB (ref 101–111)
CO2: 25 mmol/L (ref 22–32)
Calcium: 8.1 mg/dL — ABNORMAL LOW (ref 8.9–10.3)
Creatinine, Ser: 4.15 mg/dL — ABNORMAL HIGH (ref 0.61–1.24)
GFR calc non Af Amer: 13 mL/min — ABNORMAL LOW (ref >60)
GFR, EST AFRICAN AMERICAN: 15 mL/min — AB (ref >60)
Glucose, Bld: 98 mg/dL (ref 65–99)
Potassium: 4.4 mmol/L (ref 3.5–5.1)
SODIUM: 137 mmol/L (ref 135–145)
Total Bilirubin: 0.9 mg/dL (ref 0.3–1.2)
Total Protein: 5.7 g/dL — ABNORMAL LOW (ref 6.5–8.1)

## 2015-06-18 LAB — CBC
HCT: 28.1 % — ABNORMAL LOW (ref 39.0–52.0)
Hemoglobin: 8.8 g/dL — ABNORMAL LOW (ref 13.0–17.0)
MCH: 31.2 pg (ref 26.0–34.0)
MCHC: 31.3 g/dL (ref 30.0–36.0)
MCV: 99.6 fL (ref 78.0–100.0)
PLATELETS: 237 10*3/uL (ref 150–400)
RBC: 2.82 MIL/uL — ABNORMAL LOW (ref 4.22–5.81)
RDW: 21.5 % — ABNORMAL HIGH (ref 11.5–15.5)
WBC: 15.7 10*3/uL — ABNORMAL HIGH (ref 4.0–10.5)

## 2015-06-18 LAB — GLUCOSE, CAPILLARY
GLUCOSE-CAPILLARY: 174 mg/dL — AB (ref 65–99)
GLUCOSE-CAPILLARY: 110 mg/dL — AB (ref 65–99)
GLUCOSE-CAPILLARY: 84 mg/dL (ref 65–99)
GLUCOSE-CAPILLARY: 91 mg/dL (ref 65–99)
Glucose-Capillary: 149 mg/dL — ABNORMAL HIGH (ref 65–99)
Glucose-Capillary: 127 mg/dL — ABNORMAL HIGH (ref 65–99)
Glucose-Capillary: 115 mg/dL — ABNORMAL HIGH (ref 65–99)

## 2015-06-18 LAB — CULTURE, RESPIRATORY

## 2015-06-18 LAB — CULTURE, RESPIRATORY W GRAM STAIN

## 2015-06-18 MED ORDER — SODIUM CHLORIDE 0.9 % IV SOLN
1.0000 g | INTRAVENOUS | Status: AC
  Administered 2015-06-19 – 2015-06-28 (×10): 1 g via INTRAVENOUS
  Filled 2015-06-18 (×11): qty 1

## 2015-06-18 MED ORDER — SULFAMETHOXAZOLE-TRIMETHOPRIM 400-80 MG/5ML IV SOLN
400.0000 mg | INTRAVENOUS | Status: DC
  Administered 2015-06-20 – 2015-06-25 (×5): 400 mg via INTRAVENOUS
  Filled 2015-06-18 (×12): qty 25

## 2015-06-18 MED ORDER — SULFAMETHOXAZOLE-TRIMETHOPRIM 400-80 MG/5ML IV SOLN
400.0000 mg | Freq: Once | INTRAVENOUS | Status: AC
  Administered 2015-06-18: 400 mg via INTRAVENOUS
  Filled 2015-06-18: qty 25

## 2015-06-18 MED ORDER — COLISTIMETHATE NEBULIZED SOLUTION
75.0000 mg | Freq: Two times a day (BID) | INTRAMUSCULAR | Status: DC
  Filled 2015-06-18 (×2): qty 2

## 2015-06-18 MED ORDER — CETYLPYRIDINIUM CHLORIDE 0.05 % MT LIQD
7.0000 mL | Freq: Two times a day (BID) | OROMUCOSAL | Status: DC
  Administered 2015-06-18 – 2015-07-04 (×29): 7 mL via OROMUCOSAL

## 2015-06-18 MED ORDER — COLISTIMETHATE NEBULIZED SOLUTION
150.0000 mg | Freq: Two times a day (BID) | INTRAMUSCULAR | Status: AC
  Administered 2015-06-18 – 2015-06-28 (×19): 150 mg via RESPIRATORY_TRACT
  Filled 2015-06-18 (×22): qty 2

## 2015-06-18 MED ORDER — SODIUM CHLORIDE 0.9 % IV SOLN
1.0000 g | INTRAVENOUS | Status: AC
  Administered 2015-06-18: 1 g via INTRAVENOUS
  Filled 2015-06-18: qty 1

## 2015-06-18 NOTE — Progress Notes (Addendum)
ANTIBIOTIC CONSULT NOTE  Pharmacy Consult for Merrem, Septra, Inhaled colistin Indication: Pneumonia  Allergies  Allergen Reactions  . Yellow Dyes (Non-Tartrazine) Other (See Comments)    Burns arms Cough medications (tussionex and benzonatate ok)  . Levaquin [Levofloxacin In D5w] Itching    Patient Measurements: Height: 5\' 7"  (170.2 cm) Weight: 174 lb 2.6 oz (79 kg) IBW/kg (Calculated) : 66.1  Vital Signs: Temp: 98.2 F (36.8 C) (12/27 1247) Temp Source: Oral (12/27 1247) BP: 127/50 mmHg (12/27 1200) Pulse Rate: 95 (12/27 1200) Intake/Output from previous day: 12/26 0701 - 12/27 0700 In: 180 [I.V.:30; IV Piggyback:150] Out: 341  Intake/Output from this shift: Total I/O In: 530 [P.O.:480; IV Piggyback:50] Out: 0   Labs:  Recent Labs  06/16/15 0303 06/17/15 0510 06/18/15 0503  WBC 14.8* 16.7* 15.7*  HGB 8.4* 8.6* 8.8*  PLT 241 259 237  CREATININE 3.87* 6.20* 4.15*   Estimated Creatinine Clearance: 13.7 mL/min (by C-G formula based on Cr of 4.15). No results for input(s): VANCOTROUGH, VANCOPEAK, VANCORANDOM, GENTTROUGH, GENTPEAK, GENTRANDOM, TOBRATROUGH, TOBRAPEAK, TOBRARND, AMIKACINPEAK, AMIKACINTROU, AMIKACIN in the last 72 hours.    Assessment: 41 yom with ESRD. Pt is on abx for necrotizing klebsiella pneumonia. Now with potential CRE Kleb Pneumo. Patient has CKD and on iHD Tu/Th/Sa. Discussed with labs today and it has these MIC:  Imipenem = 1 Merropenem = <0.25 Doripenem = 1  Discussed with Dr. Megan Salon. I'm sure if this is really a CRE organism. We are going to use triple therapy for this with IV Merrem, IV septra, and inhaled colistin. He is ESRD already.   Vancomycin 12/16 > 12/19 Cefepime 12/16 > 12/19 Ceftazidime 12/19 >>12/25 Flagyl 12/23 >> 12/25 Aztreonam 12/25>>12/27 Tigecycline 12/25>>12/27  Inhaled colistin 12/27>> Merrem 12/27>> Septra 12/27>>  12/17: Trach asp>>klebsiella oxytoca  12/21: Resp: GNR 12/16 blood: ngF 12/17 blood:  ngF 12/21 resp: GNR -- grew while on ceftaz   Goal of Therapy:  Resolution of infection    Plan:   Septra 400mg  IV q24 Merrem 1g IV q24 Inhaled colistin 150mg  IH BID  Onnie Boer, PharmD Pager: [phone] 06/18/2015 2:51 PM

## 2015-06-18 NOTE — Progress Notes (Signed)
[facility] Progress Note   Subjective: on HD  Filed Vitals:   06/18/15 0745 06/18/15 1000 06/18/15 1045 06/18/15 1200  BP: 134/58 135/58  127/50  Pulse: 106  106 95  Temp: 97.7 F (36.5 C)     TempSrc: Axillary     Resp: 23 19 21 23   Height:      Weight:      SpO2: 99% 98% 94% 96%    Inpatient medications: . antiseptic oral rinse  7 mL Mouth Rinse BID  . atorvastatin  20 mg Oral q1800  . calcitRIOL  0.5 mcg Oral Daily  . chlorhexidine gluconate  15 mL Mouth Rinse BID  . darbepoetin (ARANESP) injection - DIALYSIS  200 mcg Intravenous Q Mon-HD  . docusate sodium  100 mg Oral BID  . ferric gluconate (FERRLECIT/NULECIT) IV  125 mg Intravenous Q T,Th,Sa-HD  . heparin subcutaneous  5,000 Units Subcutaneous 3 times per day  . ipratropium-albuterol  3 mL Nebulization TID  . metoprolol tartrate  50 mg Oral BID  . multivitamin  1 tablet Oral QHS  . predniSONE  20 mg Oral Q breakfast  . sodium chloride  3 mL Intravenous Q12H     sodium chloride, sodium chloride, acetaminophen, alteplase, clonazePAM, fentaNYL (SUBLIMAZE) injection, guaiFENesin, heparin, heparin, heparin, heparin, levalbuterol, lidocaine (PF), lidocaine-prilocaine, methocarbamol, metoprolol, pentafluoroprop-tetrafluoroeth, polyethylene glycol, senna-docusate, zolpidem  Exam: Calm, no distress No jvd Chest bibasilar rhonchi/ rales RRR no rub Abd soft no ascites or edema No leg or UE edema Neuro is lethargic, responds well  CXR 12/26 L base obscured, poss R infiltrate resolving  [place] [facility]  4h 2/2.25 bath 86kg Hep 8600 R IJ cath / RUE AVF(maturing) Venofer 100/ hd x 10  Hect 1 ug   Assessment: 1 PNA klebs and then enterobacter, abx per ID 2 Resp failure - on FM O2 today 3 COPD 4 PTX w chest tube 5 ESRD recent start 6 Anemia max esa 7 Afib on MTP 50 bid 8 Vol is 7kg under prior dry wt   Plan - HD tomorrow, keep even   Kelly Splinter MD [facility] pager [phone]     cell [phone] 06/18/2015, 12:43 PM    Recent Labs Lab 06/13/15 1338  06/16/15 0303 06/17/15 0510 06/18/15 0503  NA 138  < > 139 139 137  K 4.2  < > 3.6 4.3 4.4  CL 97*  < > 96* 98* 95*  CO2 28  < > 28 23 25   GLUCOSE 200*  < > 135* 88 98  BUN 64*  < > 74* 143* 73*  CREATININE 4.32*  < > 3.87* 6.20* 4.15*  CALCIUM 8.3*  < > 8.3* 8.2* 8.1*  PHOS 5.5*  --  6.2* 9.9*  --   < > = values in this interval not displayed.  Recent Labs Lab 06/12/15 1550 06/13/15 1338 06/18/15 0503  AST  --   --  27  ALT  --   --  23  ALKPHOS  --   --  56  BILITOT  --   --  0.9  PROT  --   --  5.7*  ALBUMIN 2.7* 2.7* 2.8*    Recent Labs Lab 06/13/15 0227  06/16/15 0303 06/17/15 0510 06/18/15 0503  WBC 13.2*  < > 14.8* 16.7* 15.7*  NEUTROABS 12.0*  --   --   --   --   HGB 8.1*  < > 8.4* 8.6* 8.8*  HCT 26.6*  < > 27.9* 28.2* 28.1*  MCV 98.9  < > 101.8* 98.6 99.6  PLT 278  < > 241 259 237  < > = values in this interval not displayed.

## 2015-06-18 NOTE — Progress Notes (Signed)
Patient ID: Terry [name], male   DOB: [birth year], 80 y.o.   MRN: [id number]         [place] [place] for Infectious Disease    Date of Admission:  06/07/2015   Total days of antibiotics 12         Principal Problem:   HCAP (healthcare-associated pneumonia) Active Problems:   COPD GOLD III   CAD (coronary artery disease) of artery bypass graft   Essential hypertension   Acute respiratory failure with hypoxia (HCC)   ESRD (end stage renal disease) ([place])   Chronic atrial fibrillation ([place])   Sepsis ([place])   Acute hypoxemic respiratory failure ([place])   Chest tube in place   Encounter for intubation   Necrotizing pneumonia ([place])   Pneumonia due to Klebsiella pneumoniae ([place])   Encounter for orogastric (OG) tube placement   . antiseptic oral rinse  7 mL Mouth Rinse BID  . atorvastatin  20 mg Oral q1800  . calcitRIOL  0.5 mcg Oral Daily  . chlorhexidine gluconate  15 mL Mouth Rinse BID  . darbepoetin (ARANESP) injection - DIALYSIS  200 mcg Intravenous Q Mon-HD  . docusate sodium  100 mg Oral BID  . ferric gluconate (FERRLECIT/NULECIT) IV  125 mg Intravenous Q T,Th,Sa-HD  . heparin subcutaneous  5,000 Units Subcutaneous 3 times per day  . ipratropium-albuterol  3 mL Nebulization TID  . metoprolol tartrate  50 mg Oral BID  . multivitamin  1 tablet Oral QHS  . predniSONE  20 mg Oral Q breakfast  . sodium chloride  3 mL Intravenous Q12H    SUBJECTIVE: Terry [name] remains very weak. He continues to have a cough productive of some bloody sputum. He gets winded and fatigued very easily. His wife feels like he is more congested today. She is worried that he might end up back on the ventilator.  Review of Systems: Review of Systems  Constitutional: Positive for malaise/fatigue. Negative for fever, chills and diaphoresis.  HENT: Negative for sore throat.   Respiratory: Positive for cough, hemoptysis, sputum production and shortness of breath.   Cardiovascular: Positive for chest pain.    Skin: Negative for rash.    Past Medical History  Diagnosis Date  . Stroke ([place]) 2012  . High cholesterol   . Hypertension   . Kidney disease     CKD4, sees [place] [facility], considering dialysis  . COPD (chronic obstructive pulmonary disease) ([place])   . CAD (coronary artery disease) [place], [name]  . Acute and chronic respiratory failure 10/22/2012  . Osteoarthritis 10/22/2012  . Pneumonia April 2014  . Atrial fibrillation [facility])     Social History  Substance Use Topics  . Smoking status: Former Smoker -- 1.00 packs/day for 49 years    Types: Cigarettes, Pipe, Cigars    Quit date: 02/21/2000  . Smokeless tobacco: Never Used  . Alcohol Use: No    Family History  Problem Relation Age of Onset  . Cancer Mother     Breast  . Heart disease Father   . Heart attack Father   . Parkinson's disease Brother    Allergies  Allergen Reactions  . Yellow Dyes (Non-Tartrazine) Other (See Comments)    Burns arms Cough medications (tussionex and benzonatate ok)  . Levaquin [Levofloxacin In D5w] Itching    OBJECTIVE: Filed Vitals:   06/18/15 1045 06/18/15 1200 06/18/15 1247 06/18/15 1338  BP:  127/50    Pulse: 106 95    Temp:   98.2  F (36.8 C)   TempSrc:   Oral   Resp: 21 23    Height:      Weight:      SpO2: 94% 96%  95%   Body mass index is 27.27 kg/(m^2).  Physical Exam  Constitutional:  He is sitting up on the bedside attempting to eat some lunch but it appears that nothing on his tray has been touched.  Cardiovascular: Normal rate and regular rhythm.   No murmur heard. Pulmonary/Chest: Effort normal. He has no rales.  Left chest tube in place.  Psychiatric: Mood and affect normal.    Lab Results Lab Results  Component Value Date   WBC 15.7* 06/18/2015   HGB 8.8* 06/18/2015   HCT 28.1* 06/18/2015   MCV 99.6 06/18/2015   PLT 237 06/18/2015    Lab Results  Component Value Date   CREATININE 4.15* 06/18/2015   BUN 73* 06/18/2015   NA 137 06/18/2015    K 4.4 06/18/2015   CL 95* 06/18/2015   CO2 25 06/18/2015    Lab Results  Component Value Date   ALT 23 06/18/2015   AST 27 06/18/2015   ALKPHOS 56 06/18/2015   BILITOT 0.9 06/18/2015     Microbiology: Recent Results (from the past 240 hour(s))  Culture, respiratory (NON-Expectorated)     Status: None   Collection Time: 06/12/15  3:03 PM  Result Value Ref Range Status   Specimen Description BRONCHIAL ALVEOLAR LAVAGE  Final   Special Requests NONE  Final   Gram Stain   Final    ABUNDANT WBC PRESENT, PREDOMINANTLY PMN NO SQUAMOUS EPITHELIAL CELLS SEEN NO ORGANISMS SEEN    Culture   Final    ABUNDANT ENTEROBACTER CLOACAE Note: TIGECYCLINE  R >=8 COLISTIN  .25ug/ml ETEST results for this drug are "FOR INVESTIGATIONAL USE ONLY" and should NOT be used for clinical purposes. CRE CRITICAL RESULT CALLED TO, READ BACK BY AND VERIFIED WITH: J BISHOP RN 11AM 06/16/15 GUSTK    Report Status 06/18/2015 FINAL  Final   Organism ID, Bacteria ENTEROBACTER CLOACAE  Final      Susceptibility   Enterobacter cloacae - MIC*    CEFAZOLIN Value in next row Resistant      >=64 RESISTANTPerformed at [facility]    CEFEPIME Value in next row Resistant      >=64 RESISTANTPerformed at [facility]    CEFTAZIDIME Value in next row Resistant      >=64 RESISTANTPerformed at [facility]    CEFTRIAXONE Value in next row Resistant      >=64 RESISTANTPerformed at [facility]    CIPROFLOXACIN Value in next row Sensitive      <=0.25 SENSITIVEPerformed at [place] Value in next row Sensitive      <=1 SENSITIVEPerformed at [facility]    IMIPENEM Value in next row       RESISTANTPerformed at [facility]    PIP/TAZO Value in next row Resistant      >=128 RESISTANTPerformed at [facility]    TOBRAMYCIN Value in next row Sensitive      <=1 SENSITIVEPerformed at [facility]    TRIMETH/SULFA Value in next row  Sensitive      <=20 SENSITIVE(NOTE)    AMIKACIN Value in next row Sensitive      <=20 SENSITIVE(NOTE)    AZTREONAM Value in next row Resistant      >=64 RESISTANTPerformed at [facility]    *  ABUNDANT ENTEROBACTER CLOACAE   ASSESSMENT: His latest sputum culture has grown a very drug-resistant Enterobacter cloacae. It is resistant to imipenem which by definition makes it a CRE organism (carbepenem resistant). However the MIC to meropenem is quite low suggesting that it may be effective as part of a multidrug regimen. Our infectious disease pharmacist, Onnie Boer, has reviewed all of this and we have decided to treat with IV meropenem, IV trimethoprim sulfamethoxazole and aerosolized colistin. Although he is currently afebrile and was able to be extubated yesterday his chest x-ray still shows right-sided infiltrates and I think that he is at very high risk of further respiratory decompensation if we do not treat him.  PLAN: 1. Start IV meropenem and IV trimethoprim sulfamethoxazole along with aerosolized colistin  Michel Bickers, MD [facility] for [place] [phone] pager   [phone] cell 06/18/2015, 2:39 PM

## 2015-06-18 NOTE — Progress Notes (Signed)
PT Cancellation Note  Patient Details Name: Terry [name] MRN: [id number] DOB: 03-Jan-1935   Cancelled Treatment:    Reason Eval/Treat Not Completed: Other (comment) (Pt refused.  Pt states,"I sat EOB to eat breakfast and I am exhausted.  I can't do anything until later."  "Come back this pm or tomorrow am." )Will return as able.  Thanks.    Irwin Brakeman F 06/18/2015, 9:43 AM [facility] [phone] (pager)

## 2015-06-18 NOTE — Progress Notes (Signed)
OT Cancellation Note  Patient Details Name: Terry [name] MRN: [id number] DOB: 12/30/1934   Cancelled Treatment:    Reason Eval/Treat Not Completed: Patient declined, no reason specified. Pt reports being exhausted from sitting EOB for breakfast. OT to check back as time allows and patients readiness to participate.  Vonita Moss   OTR/L Pager: [phone] Office: [phone] .  06/18/2015, 10:18 AM

## 2015-06-18 NOTE — Progress Notes (Signed)
Speech Language Pathology Treatment: Dysphagia  Patient Details Name: Terry [name] MRN: [id number] DOB: 04/21/1935 Today's Date: 06/18/2015 Time: [id number] SLP Time Calculation (min) (ACUTE ONLY): 19 min  Assessment / Plan / Recommendation Clinical Impression  Dysphagia session brief; pt completing meal as therapist arrived. Independently recalled chin tuck and modified independence implementing. No s/s aspiration. Pt deconditioned and fatigued at end of meal; no dyspnea. Educated pt of plan to continue thick liquids with vigilant use of chin tuck and SLP will trial thin liquids with chin tuck next session. Pt with ice chips at bedside pt performing chin tuck, therefore continue in moderation.   HPI HPI: 80yo male with hx ESRD, AFib, COPD, CVA presented 12/16 to the ED with a 2 day history of progressive SOB and cough with yellow sputum. Had worsening respiratory status while at HD and EMS was called. Diagnosed with PNA vs COPD exacerbation vs CHF exacerbation. Intubated 12/16-12/18, then again 12/21-12/25.      SLP Plan  Continue with current plan of care     Recommendations  Diet recommendations: Dysphagia 1 (puree);Nectar-thick liquid Liquids provided via: Cup Medication Administration: Crushed with puree Supervision: Patient able to self feed;Intermittent supervision to cue for compensatory strategies Compensations: Slow rate;Small sips/bites;Chin tuck Postural Changes and/or Swallow Maneuvers: Seated upright 90 degrees              Oral Care Recommendations: Oral care QID Follow up Recommendations:  (TBD) Plan: Continue with current plan of care   [name] 06/18/2015, 9:09 AM   Orbie Pyo Colvin Caroli.Ed [employer] [phone]

## 2015-06-18 NOTE — Progress Notes (Signed)
TEAM 1 - Stepdown/ICU TEAM PROGRESS NOTE  Terry [name] [id number] DOB: [birth year] DOA: 06/07/2015 PCP: [name], DO  Admit HPI / Brief Narrative: 80yo male with hx ESRD, AFib, COPD, CVA presented 12/16 to the ED via EMS with a 2 day history of progressive SOB and cough with yellow sputum. Had worsening respiratory status while at HD and EMS was called.   Significant Events: 12/16 intubated, L tension ptx post intubation, chest tube placed in ER  12/18 extubated 12/19 worsening distress, HD, bipap, small PTX resolved after chest tube back to suction 12/20 weak resp status remains, failed high flow nasal cannula O2 reservoir  12/21 required intubation, bronch w/ bloody clots, no DAH 12/25 extubated   HPI/Subjective: Pt is resting comfortably in bed.  He has no new complaints.  He denies n/v, sob, or cp at present.    Assessment/Plan:  Acute hypoxemic respiratory failure rapildly improving - extubated 12/25 - weaned to [place] initially, but has required venturi off and on today - follow   Very drug-resistant Enterobacter cloacae HCAP ID and Pharm directing abx selection/delivery    L PTX CT remains in place - CT management per PCCM - will check f/u CXR in AM and ask PCCM to see - suspect CT can be removed tomorrow   COPD w/ acute exacerbation  Acute exacerbation appears to have resolved - follow   HTN  BP currently controlled   CAD s/p CABG Asymptomatic at this time  Chronic AFib on coumadin  Rate controlled - coumadin has been on hold due to bloody resp secretions   Chronic grade 1 Diastolic heart failure No evidence of signif volume overload at this time - volume management per HD   ESRD on HD HD per Nephrology   Anemia of chronic disease Hgb holding steady   Hx of CVA  Code Status: FULL Family Communication: spoke w/ wife at bedside  Disposition Plan: SDU until chest tube removed   Consultants: Nephrology ID PCCM  Antibiotics: Per  ID service   DVT prophylaxis: SQ heparin   Objective: Blood pressure 124/50, pulse 83, temperature 97.8 F (36.6 C), temperature source Oral, resp. rate 22, height 5\' 7"  (1.702 m), weight 79 kg (174 lb 2.6 oz), SpO2 98 %.  Intake/Output Summary (Last 24 hours) at 06/18/15 1718 Last data filed at 06/18/15 1630  Gross per 24 hour  Intake   1255 ml  Output      0 ml  Net   1255 ml   Exam: General: No acute respiratory distress at rest in bed  Lungs: Clear to auscultation bilaterally  Cardiovascular: Regular rate without murmur gallop or rub  Abdomen: Nontender, nondistended, soft, bowel sounds positive, no rebound, no ascites, no appreciable mass Extremities: No significant cyanosis, clubbing, edema bilateral lower extremities  Data Reviewed: Basic Metabolic Panel:  Recent Labs Lab 06/12/15 1550 06/13/15 1338 06/15/15 0444 06/16/15 0303 06/17/15 0510 06/18/15 0503  NA 137 138 139 139 139 137  K 5.8* 4.2 3.7 3.6 4.3 4.4  CL 98* 97* 97* 96* 98* 95*  CO2 24 28 27 28 23 25   GLUCOSE 147* 200* 169* 135* 88 98  BUN 102* 64* 125* 74* 143* 73*  CREATININE 7.21* 4.32* 5.77* 3.87* 6.20* 4.15*  CALCIUM 8.0* 8.3* 8.6* 8.3* 8.2* 8.1*  MG  --   --   --  2.2 2.6*  --   PHOS 9.0* 5.5*  --  6.2* 9.9*  --     CBC:  Recent Labs Lab 06/13/15 0227 06/14/15 0256 06/15/15 0444 06/16/15 0303 06/17/15 0510 06/18/15 0503  WBC 13.2* 15.0* 15.9* 14.8* 16.7* 15.7*  NEUTROABS 12.0*  --   --   --   --   --   HGB 8.1* 8.5* 8.3* 8.4* 8.6* 8.8*  HCT 26.6* 27.6* 27.1* 27.9* 28.2* 28.1*  MCV 98.9 101.1* 100.7* 101.8* 98.6 99.6  PLT 278 248 254 241 259 237    Liver Function Tests:  Recent Labs Lab 06/12/15 1550 06/13/15 1338 06/18/15 0503  AST  --   --  27  ALT  --   --  23  ALKPHOS  --   --  56  BILITOT  --   --  0.9  PROT  --   --  5.7*  ALBUMIN 2.7* 2.7* 2.8*   Coags:  Recent Labs Lab 06/13/15 0227 06/14/15 0256 06/15/15 0444 06/16/15 0303 06/17/15 0510  INR 2.22*  1.55* 1.27 1.21 1.21   CBG:  Recent Labs Lab 06/16/15 2019 06/17/15 0018 06/17/15 0415 06/17/15 0733 06/17/15 1400  GLUCAP 110* 93 75 84 107*    Recent Results (from the past 240 hour(s))  Culture, respiratory (NON-Expectorated)     Status: None   Collection Time: 06/12/15  3:03 PM  Result Value Ref Range Status   Specimen Description BRONCHIAL ALVEOLAR LAVAGE  Final   Special Requests NONE  Final   Gram Stain   Final    ABUNDANT WBC PRESENT, PREDOMINANTLY PMN NO SQUAMOUS EPITHELIAL CELLS SEEN NO ORGANISMS SEEN    Culture   Final    ABUNDANT ENTEROBACTER CLOACAE Note: TIGECYCLINE  R >=8 COLISTIN  .25ug/ml ETEST results for this drug are "FOR INVESTIGATIONAL USE ONLY" and should NOT be used for clinical purposes. CRE CRITICAL RESULT CALLED TO, READ BACK BY AND VERIFIED WITH: J BISHOP RN 11AM 06/16/15 GUSTK    Report Status 06/18/2015 FINAL  Final   Organism ID, Bacteria ENTEROBACTER CLOACAE  Final      Susceptibility   Enterobacter cloacae - MIC*    CEFAZOLIN Value in next row Resistant      >=64 RESISTANTPerformed at [facility]    CEFEPIME Value in next row Resistant      >=64 RESISTANTPerformed at [facility]    CEFTAZIDIME Value in next row Resistant      >=64 RESISTANTPerformed at [facility]    CEFTRIAXONE Value in next row Resistant      >=64 RESISTANTPerformed at [facility]    CIPROFLOXACIN Value in next row Sensitive      <=0.25 SENSITIVEPerformed at [place] Value in next row Sensitive      <=1 SENSITIVEPerformed at [facility]    IMIPENEM Value in next row       RESISTANTPerformed at [facility]    PIP/TAZO Value in next row Resistant      >=128 RESISTANTPerformed at [facility]    TOBRAMYCIN Value in next row Sensitive      <=1 SENSITIVEPerformed at [facility]    TRIMETH/SULFA Value in next row Sensitive      <=20 SENSITIVE(NOTE)    AMIKACIN Value in next  row Sensitive      <=20 SENSITIVE(NOTE)    AZTREONAM Value in next row Resistant      >=64 RESISTANTPerformed at [facility]    * ABUNDANT ENTEROBACTER CLOACAE     Studies:   Recent x-ray studies have been reviewed in  detail by the Attending Physician  Scheduled Meds:  Scheduled Meds: . antiseptic oral rinse  7 mL Mouth Rinse BID  . atorvastatin  20 mg Oral q1800  . calcitRIOL  0.5 mcg Oral Daily  . chlorhexidine gluconate  15 mL Mouth Rinse BID  . colistimethate  150 mg Inhalation BID  . darbepoetin (ARANESP) injection - DIALYSIS  200 mcg Intravenous Q Mon-HD  . docusate sodium  100 mg Oral BID  . ferric gluconate (FERRLECIT/NULECIT) IV  125 mg Intravenous Q T,Th,Sa-HD  . heparin subcutaneous  5,000 Units Subcutaneous 3 times per day  . ipratropium-albuterol  3 mL Nebulization TID  . [START ON 06/19/2015] meropenem (MERREM) IV  1 g Intravenous Q24H  . metoprolol tartrate  50 mg Oral BID  . multivitamin  1 tablet Oral QHS  . predniSONE  20 mg Oral Q breakfast  . sodium chloride  3 mL Intravenous Q12H  . sulfamethoxazole-trimethoprim  400 mg Intravenous Once   Followed by  . [START ON 06/19/2015] sulfamethoxazole-trimethoprim  400 mg Intravenous Q24H    Time spent on care of this patient: 25 mins   Dodd Schmid T , MD   [facility] Office  [phone] Pager - Text Page per Shea Evans as per below:  On-Call/Text Page:      [email or web address]      password [email or web address]  If 7PM-7AM, please contact night-coverage [email or web address] Password [email or web address] 06/18/2015, 5:18 PM   LOS: 11 days

## 2015-06-18 NOTE — Care Management Important Message (Signed)
Important Message  Patient Details  Name: Terry [name] MRN: [id number] Date of Birth: 1934-06-23   Medicare Important Message Given:  Yes    Adalida Garver T, RN 06/18/2015, 10:49 AM

## 2015-06-19 ENCOUNTER — Inpatient Hospital Stay (HOSPITAL_COMMUNITY): Payer: Medicare PPO

## 2015-06-19 DIAGNOSIS — D72829 Elevated white blood cell count, unspecified: Secondary | ICD-10-CM

## 2015-06-19 LAB — CBC WITH DIFFERENTIAL/PLATELET
BASOS PCT: 0 %
Basophils Absolute: 0 10*3/uL (ref 0.0–0.1)
EOS ABS: 0.1 10*3/uL (ref 0.0–0.7)
Eosinophils Relative: 1 %
HEMATOCRIT: 28.3 % — AB (ref 39.0–52.0)
Hemoglobin: 8.9 g/dL — ABNORMAL LOW (ref 13.0–17.0)
LYMPHS ABS: 1.7 10*3/uL (ref 0.7–4.0)
Lymphocytes Relative: 9 %
MCH: 30.6 pg (ref 26.0–34.0)
MCHC: 31.4 g/dL (ref 30.0–36.0)
MCV: 97.3 fL (ref 78.0–100.0)
MONO ABS: 0.6 10*3/uL (ref 0.1–1.0)
MONOS PCT: 3 %
Neutro Abs: 16.9 10*3/uL — ABNORMAL HIGH (ref 1.7–7.7)
Neutrophils Relative %: 87 %
Platelets: 250 10*3/uL (ref 150–400)
RBC: 2.91 MIL/uL — ABNORMAL LOW (ref 4.22–5.81)
RDW: 20.8 % — AB (ref 11.5–15.5)
WBC: 19.3 10*3/uL — ABNORMAL HIGH (ref 4.0–10.5)

## 2015-06-19 LAB — RENAL FUNCTION PANEL
ALBUMIN: 2.5 g/dL — AB (ref 3.5–5.0)
ANION GAP: 17 — AB (ref 5–15)
BUN: 120 mg/dL — AB (ref 6–20)
CO2: 23 mmol/L (ref 22–32)
Calcium: 8 mg/dL — ABNORMAL LOW (ref 8.9–10.3)
Chloride: 95 mmol/L — ABNORMAL LOW (ref 101–111)
Creatinine, Ser: 5.96 mg/dL — ABNORMAL HIGH (ref 0.61–1.24)
GFR calc Af Amer: 9 mL/min — ABNORMAL LOW (ref >60)
GFR calc non Af Amer: 8 mL/min — ABNORMAL LOW (ref >60)
GLUCOSE: 108 mg/dL — AB (ref 65–99)
PHOSPHORUS: 11.4 mg/dL — AB (ref 2.5–4.6)
POTASSIUM: 5.1 mmol/L (ref 3.5–5.1)
SODIUM: 135 mmol/L (ref 135–145)

## 2015-06-19 LAB — CBC
HCT: 30.3 % — ABNORMAL LOW (ref 39.0–52.0)
Hemoglobin: 9.4 g/dL — ABNORMAL LOW (ref 13.0–17.0)
MCH: 30.5 pg (ref 26.0–34.0)
MCHC: 31 g/dL (ref 30.0–36.0)
MCV: 98.4 fL (ref 78.0–100.0)
PLATELETS: 169 10*3/uL (ref 150–400)
RBC: 3.08 MIL/uL — AB (ref 4.22–5.81)
RDW: 21 % — ABNORMAL HIGH (ref 11.5–15.5)
WBC: 19.3 10*3/uL — AB (ref 4.0–10.5)

## 2015-06-19 MED ORDER — CALCITRIOL 0.5 MCG PO CAPS
ORAL_CAPSULE | ORAL | Status: AC
  Administered 2015-06-19: 0.5 ug via ORAL
  Filled 2015-06-19: qty 1

## 2015-06-19 MED ORDER — ARFORMOTEROL TARTRATE 15 MCG/2ML IN NEBU
15.0000 ug | INHALATION_SOLUTION | Freq: Two times a day (BID) | RESPIRATORY_TRACT | Status: DC
  Administered 2015-06-19 – 2015-07-04 (×30): 15 ug via RESPIRATORY_TRACT
  Filled 2015-06-19 (×31): qty 2

## 2015-06-19 MED ORDER — PREDNISONE 5 MG/5ML PO SOLN
10.0000 mg | Freq: Every day | ORAL | Status: DC
  Administered 2015-06-20 – 2015-06-24 (×4): 10 mg via ORAL
  Filled 2015-06-19 (×6): qty 10

## 2015-06-19 MED ORDER — DOXERCALCIFEROL 4 MCG/2ML IV SOLN
2.0000 ug | INTRAVENOUS | Status: DC
  Administered 2015-06-21 – 2015-07-03 (×6): 2 ug via INTRAVENOUS
  Filled 2015-06-19 (×6): qty 2

## 2015-06-19 MED ORDER — SEVELAMER CARBONATE 800 MG PO TABS
1600.0000 mg | ORAL_TABLET | Freq: Three times a day (TID) | ORAL | Status: DC
  Administered 2015-06-20 – 2015-06-28 (×21): 1600 mg via ORAL
  Filled 2015-06-19 (×22): qty 2

## 2015-06-19 MED ORDER — BUDESONIDE 0.5 MG/2ML IN SUSP
0.5000 mg | Freq: Two times a day (BID) | RESPIRATORY_TRACT | Status: DC
  Administered 2015-06-19 – 2015-07-04 (×30): 0.5 mg via RESPIRATORY_TRACT
  Filled 2015-06-19 (×31): qty 2

## 2015-06-19 MED ORDER — RESOURCE THICKENUP CLEAR PO POWD
ORAL | Status: DC | PRN
  Filled 2015-06-19: qty 125

## 2015-06-19 MED ORDER — IPRATROPIUM-ALBUTEROL 0.5-2.5 (3) MG/3ML IN SOLN
RESPIRATORY_TRACT | Status: AC
  Filled 2015-06-19: qty 3

## 2015-06-19 MED ORDER — PRO-STAT SUGAR FREE PO LIQD
30.0000 mL | Freq: Two times a day (BID) | ORAL | Status: DC
  Administered 2015-06-19 – 2015-07-04 (×26): 30 mL via ORAL
  Filled 2015-06-19 (×27): qty 30

## 2015-06-19 MED ORDER — DOXERCALCIFEROL 4 MCG/2ML IV SOLN: 1.0000 ug | INTRAVENOUS | Status: DC

## 2015-06-19 NOTE — Procedures (Signed)
  Procedure: Left chest tube removal  Indication: Left tension pneumothorax  Procedure explained to patient. No air leak confirmed. Chest tube insertion site cleanse with chloraseptic skin prep. Sutures removed. Patient instructed to take a deep breath and hold. Chest tube pulled out.  Small serous drainage noted. Patient tolerated procedure. Patient left resting in bed. O2 sat 96% on FiO2 40% VM.   Post-procedure chest x-ray ordered.  Arliss Hepburn S. [facility] ANP-BC Pulmonary and [place] Pager: [phone]

## 2015-06-19 NOTE — Progress Notes (Signed)
Patient returned from Hemodialysis and noted that his O2 saturation is between 84-86% on Nasal Cannula at 4L.  [place] changed to venti mask.

## 2015-06-19 NOTE — Progress Notes (Signed)
OT Cancellation Note  Patient Details Name: Terry [name] MRN: [id number] DOB: December 07, 1934   Cancelled Treatment:    Reason Eval/Treat Not Completed: Patient at procedure or test/ unavailable (HD arriving to take patient on OT arrival) Ot to continue to follow .   Vonita Moss   OTR/L Pager: [phone] Office: [phone] .  06/19/2015, 8:45 AM

## 2015-06-19 NOTE — Progress Notes (Signed)
Noted that patient has an open area to his back with a white-cheesy substance (black head).  Wife at bedside when the substance was removed from the site.

## 2015-06-19 NOTE — Progress Notes (Addendum)
Speech Language Pathology Treatment: Dysphagia  Patient Details Name: Terry [name] MRN: [id number] DOB: 1935-05-15 Today's Date: 06/19/2015 Time: 1425-1440 SLP Time Calculation (min) (ACUTE ONLY): 15 min  Assessment / Plan / Recommendation Clinical Impression  Skilled treatment session focused on dysphagia goals. Upon arrival, patient was awake while sitting upright in bed with venturi mask in place with O2 saturations ranging from 84-92% with a cup of thin liquids with a straw on his bedside table. RN reported that she was unable to find thickener and only used it to administer his medications.  RN educated on patient's current swallowing function, diet and medication administration recommendstions, appropriate textures and swallowing compensatory strategies, she verbalized understanding. A sign was also placed to reinforce information. Patient had chest x-ray today which showed persistent airspace opacity in the lung bases, right greater than left. Due to patient's fatigue from just returning from HD, recent chest x-ray results and need for venti mask, trials of thin liquids were not administered today. Patient also declined trials of currently prescribed diet due to fatigue and just consuming his lunch meal. Patient left upright in bed with all needs within reach.    HPI HPI: 80yo male with hx ESRD, AFib, COPD, CVA presented 12/16 to the ED with a 2 day history of progressive SOB and cough with yellow sputum. Had worsening respiratory status while at HD and EMS was called. Diagnosed with PNA vs COPD exacerbation vs CHF exacerbation. Intubated 12/16-12/18, then again 12/21-12/25.      SLP Plan  Continue with current plan of care     Recommendations  Diet recommendations: Dysphagia 1 (puree);Nectar-thick liquid Liquids provided via: Cup, No straw Medication Administration: Crushed with puree Supervision: Patient able to self feed;Intermittent supervision to cue for compensatory  strategies Compensations: Slow rate;Small sips/bites;Chin tuck Postural Changes and/or Swallow Maneuvers: Seated upright 90 degrees              Oral Care Recommendations: Oral care QID Follow up Recommendations:  (TBD) Plan: Continue with current plan of care   Deserae Jennings, [place] 06/19/2015, 2:48 PM

## 2015-06-19 NOTE — Progress Notes (Signed)
[facility] TEAM 1 - Stepdown/ICU TEAM PROGRESS NOTE  KHAM MIMMS [id number] DOB: 07/26/1934 DOA: 06/07/2015 PCP: [name], DO  Admit HPI / Brief Narrative: 80yo male with hx ESRD, AFib, COPD, CVA presented 12/16 to the ED via EMS with a 2 day history of progressive SOB and cough with yellow sputum. Had worsening respiratory status while at HD and EMS was called.   Significant Events: 12/16 intubated, L tension ptx post intubation, chest tube placed in ER  12/18 extubated 12/19 worsening distress, HD, bipap, small PTX resolved after chest tube back to suction 12/20 weak resp status remains, failed high flow nasal cannula O2 reservoir  12/21 required intubation, bronch w/ bloody clots, no DAH 12/25 extubated   HPI/Subjective: Pt states he feels better today.  He is more conversant/interactive.  He denies n/v, sob, or HA.  He states his chest feels better s/p CT removal.     Assessment/Plan:  Acute hypoxemic respiratory failure Appears to be slowly improving - extubated 12/25 - back to [place] at 4L today (required [place] yesterday) - cont to follow O2 requirement, and slowly wean as able   Very drug-resistant Enterobacter cloacae HCAP ID and Pharm directing abx selection/delivery    L PTX CT removed per PCCM today - f/u CXR stable   COPD w/ acute exacerbation  Acute exacerbation appears to have resolved - d/c steroid asap in face of severe pulm infection - PCCM states his PFTs suggest very severe COPD - should he decompensate again from pulm status, re-intubation is highly likely to be required   HTN  BP currently controlled   CAD s/p CABG Asymptomatic at this time  Chronic AFib on coumadin  Rate controlled - coumadin has been on hold due to recent bloody resp secretions   Chronic grade 1 Diastolic heart failure No evidence of severe volume overload at this time - volume management per HD   ESRD on HD HD per Nephrology   Anemia of chronic disease Hgb holding  steady   Hx of CVA  Code Status: FULL Family Communication: spoke w/ wife at bedside  Disposition Plan: SDU due to high potential to acutely decompensate  Consultants: Nephrology ID PCCM  Antibiotics: Per ID service   DVT prophylaxis: SQ heparin   Objective: Blood pressure 124/59, pulse 98, temperature 97.6 F (36.4 C), temperature source Oral, resp. rate 22, height 5\' 7"  (1.702 m), weight 79 kg (174 lb 2.6 oz), SpO2 99 %.  Intake/Output Summary (Last 24 hours) at 06/19/15 1519 Last data filed at 06/19/15 1412  Gross per 24 hour  Intake    628 ml  Output      0 ml  Net    628 ml   Exam: General: No acute respiratory distress at rest  Lungs: Clear to auscultation bilaterally - poor air movement th/o  Cardiovascular: Regular rate without murmur  Abdomen: Nontender, nondistended, soft, bowel sounds positive, no rebound, no ascites, no appreciable mass Extremities: No significant cyanosis, clubbing, or edema bilateral lower extremities  Data Reviewed: Basic Metabolic Panel:  Recent Labs Lab 06/12/15 1550 06/13/15 1338 06/15/15 0444 06/16/15 0303 06/17/15 0510 06/18/15 0503 06/19/15 0926  NA 137 138 139 139 139 137 135  K 5.8* 4.2 3.7 3.6 4.3 4.4 5.1  CL 98* 97* 97* 96* 98* 95* 95*  CO2 24 28 27 28 23 25 23   GLUCOSE 147* 200* 169* 135* 88 98 108*  BUN 102* 64* 125* 74* 143* 73* 120*  CREATININE 7.21* 4.32* 5.77*  3.87* 6.20* 4.15* 5.96*  CALCIUM 8.0* 8.3* 8.6* 8.3* 8.2* 8.1* 8.0*  MG  --   --   --  2.2 2.6*  --   --   PHOS 9.0* 5.5*  --  6.2* 9.9*  --  11.4*    CBC:  Recent Labs Lab 06/13/15 0227  06/16/15 0303 06/17/15 0510 06/18/15 0503 06/19/15 0340 06/19/15 0926  WBC 13.2*  < > 14.8* 16.7* 15.7* 19.3* 19.3*  NEUTROABS 12.0*  --   --   --   --   --  16.9*  HGB 8.1*  < > 8.4* 8.6* 8.8* 9.4* 8.9*  HCT 26.6*  < > 27.9* 28.2* 28.1* 30.3* 28.3*  MCV 98.9  < > 101.8* 98.6 99.6 98.4 97.3  PLT 278  < > 241 259 237 169 250  < > = values in this  interval not displayed.  Liver Function Tests:  Recent Labs Lab 06/12/15 1550 06/13/15 1338 06/18/15 0503 06/19/15 0926  AST  --   --  27  --   ALT  --   --  23  --   ALKPHOS  --   --  56  --   BILITOT  --   --  0.9  --   PROT  --   --  5.7*  --   ALBUMIN 2.7* 2.7* 2.8* 2.5*   Coags:  Recent Labs Lab 06/13/15 0227 06/14/15 0256 06/15/15 0444 06/16/15 0303 06/17/15 0510  INR 2.22* 1.55* 1.27 1.21 1.21   CBG:  Recent Labs Lab 06/17/15 0018 06/17/15 0415 06/17/15 0733 06/17/15 1400 06/18/15 2009  GLUCAP 93 75 84 107* 91    Recent Results (from the past 240 hour(s))  Culture, respiratory (NON-Expectorated)     Status: None   Collection Time: 06/12/15  3:03 PM  Result Value Ref Range Status   Specimen Description BRONCHIAL ALVEOLAR LAVAGE  Final   Special Requests NONE  Final   Gram Stain   Final    ABUNDANT WBC PRESENT, PREDOMINANTLY PMN NO SQUAMOUS EPITHELIAL CELLS SEEN NO ORGANISMS SEEN    Culture   Final    ABUNDANT ENTEROBACTER CLOACAE Note: TIGECYCLINE  R >=8 COLISTIN  .25ug/ml ETEST results for this drug are "FOR INVESTIGATIONAL USE ONLY" and should NOT be used for clinical purposes. CRE CRITICAL RESULT CALLED TO, READ BACK BY AND VERIFIED WITH: J BISHOP RN 11AM 06/16/15 GUSTK    Report Status 06/18/2015 FINAL  Final   Organism ID, Bacteria ENTEROBACTER CLOACAE  Final      Susceptibility   Enterobacter cloacae - MIC*    CEFAZOLIN Value in next row Resistant      >=64 RESISTANTPerformed at [facility]    CEFEPIME Value in next row Resistant      >=64 RESISTANTPerformed at [facility]    CEFTAZIDIME Value in next row Resistant      >=64 RESISTANTPerformed at [facility]    CEFTRIAXONE Value in next row Resistant      >=64 RESISTANTPerformed at [facility]    CIPROFLOXACIN Value in next row Sensitive      <=0.25 SENSITIVEPerformed at [facility] Value in next row Sensitive      <=1  SENSITIVEPerformed at [facility]    IMIPENEM Value in next row       RESISTANTPerformed at [facility]    PIP/TAZO Value in next row Resistant      >=128 RESISTANTPerformed at [facility]  TOBRAMYCIN Value in next row Sensitive      <=1 SENSITIVEPerformed at [facility]    TRIMETH/SULFA Value in next row Sensitive      <=20 SENSITIVE(NOTE)    AMIKACIN Value in next row Sensitive      <=20 SENSITIVE(NOTE)    AZTREONAM Value in next row Resistant      >=64 RESISTANTPerformed at [facility]    * ABUNDANT ENTEROBACTER CLOACAE     Studies:   Recent x-ray studies have been reviewed in detail by the Attending Physician  Scheduled Meds:  Scheduled Meds: . antiseptic oral rinse  7 mL Mouth Rinse BID  . atorvastatin  20 mg Oral q1800  . calcitRIOL  0.5 mcg Oral Daily  . chlorhexidine gluconate  15 mL Mouth Rinse BID  . colistimethate  150 mg Inhalation BID  . darbepoetin (ARANESP) injection - DIALYSIS  200 mcg Intravenous Q Mon-HD  . docusate sodium  100 mg Oral BID  . heparin subcutaneous  5,000 Units Subcutaneous 3 times per day  . ipratropium-albuterol  3 mL Nebulization TID  . ipratropium-albuterol      . meropenem (MERREM) IV  1 g Intravenous Q24H  . metoprolol tartrate  50 mg Oral BID  . multivitamin  1 tablet Oral QHS  . predniSONE  20 mg Oral Q breakfast  . sodium chloride  3 mL Intravenous Q12H  . sulfamethoxazole-trimethoprim  400 mg Intravenous Q24H    Time spent on care of this patient: 25 mins   Lux [name] T , MD   [facility] Office  [phone] Pager - Text Page per Shea Evans as per below:  On-Call/Text Page:      [email or web address]      password [email or web address]  If 7PM-7AM, please contact night-coverage [email or web address] Password [email or web address] 06/19/2015, 3:19 PM   LOS: 12 days

## 2015-06-19 NOTE — Progress Notes (Addendum)
Nutrition Follow-up  DOCUMENTATION CODES:   Not applicable  INTERVENTION:    Recommend enteral nutrition support initiation.  If agree, please order CORTRAK feeding tube placement.   Magic cup TID with meals, each supplement provides 290 kcal and 9 grams of protein  NUTRITION DIAGNOSIS:   Inadequate oral intake now related to dysphagia, poor appetite as evidenced by meal completion < 25%, ongoing  GOAL:   Patient will meet greater than or equal to 90% of their needs, unmet  MONITOR:   PO intake, Supplement acceptance, Labs, Weight trends, I & O's  ASSESSMENT:   80 y/o male PMhx Stroke, HTN, ESRD-newly on HD, CHF, COPD, CAD, Acute/chronic resp failure, PNA, A FIB who presents after having worsening SOB w/ cough. Was only able to tolerate 1 hr of HD yesterday. Transported from dialysis to ED and subsequently intubated for resp failure. Was hospitalized 11/21-12/13 w/ AoC RENAL failure and was started on HD at that time. This admission he is worked up for HCAP.   Patient currently in [place].  Patient extubated 12/25.    S/p MBSS 12/26.  Diet advanced to Dys 1, nectar thick liquids.  PO intake poor at 10% per flowsheet records.  Per chart, pt very weak.  Should be receiving Magic Cup oral supplements on meal trays, however, feel he needs additional nutrition support.  Diet Order:  Dysphagia 1, nectar thick liquids  Skin:  Reviewed, no issues  Last BM:  12/27  Height:   Ht Readings from Last 1 Encounters:  06/17/15 5\' 7"  (1.702 m)    Weight:   Wt Readings from Last 1 Encounters:  06/19/15 174 lb 2.6 oz (79 kg)    Ideal Body Weight:  72.73 kg  BMI:  Body mass index is 27.27 kg/(m^2).  Estimated Nutritional Needs:   Kcal:  1700-1900  Protein:  80-90 gm  Fluid:  1.7-1.9 L  EDUCATION NEEDS:   No education needs identified at this time  Arthur Holms, RD, LDN Pager #: [phone] After-Hours Pager #: [phone]

## 2015-06-19 NOTE — Progress Notes (Signed)
PULMONARY / CRITICAL CARE MEDICINE   Name: Terry [name] MRN: [id number] DOB: [birth year]    ADMISSION DATE:  06/07/2015 CONSULTATION DATE:  06/07/15  REFERRING MD:  Vanita Panda  CHIEF COMPLAINT:  Shortness of breath  HISTORY OF PRESENT ILLNESS:   80yo male with hx ESRD, AFib, COPD, CVA presented 12/16 to the ED with a 2 day history of progressive SOB and cough with yellow sputum. Had worsening respiratory status while at HD and EMS was called.PNA vent  SUBJECTIVE:  No acute issues overnight. Doing well on [place]. CT without any output  VITAL SIGNS: BP 108/55 mmHg  Pulse 95  Temp(Src) 97.9 F (36.6 C) (Oral)  Resp 22  Ht 5\' 7"  (1.702 m)  Wt 79 kg (174 lb 2.6 oz)  BMI 27.27 kg/m2  SpO2 96%  HEMODYNAMICS:    VENTILATOR SETTINGS: Vent Mode:  [-]  FiO2 (%):  [40 %] 40 %  INTAKE / OUTPUT: I/O last 3 completed shifts: In: 1255 [P.O.:480; IV Piggyback:775] Out: 0    PHYSICAL EXAMINATION: General: chronically ill appearing man, on vent, NAD Neuro: awake, follows commands, moves all extremities (doesnt want to be extubated!) CV:  RRR, no m/g/r, R chest perm cath, L chest tube  PULM: coarse, rhonchi GI: BS+, soft, non tender Extremities:  1+ BLE edema  LABS:  BMET  Recent Labs Lab 06/17/15 0510 06/18/15 0503 06/19/15 0926  NA 139 137 135  K 4.3 4.4 5.1  CL 98* 95* 95*  CO2 23 25 23   BUN 143* 73* 120*  CREATININE 6.20* 4.15* 5.96*  GLUCOSE 88 98 108*    Electrolytes  Recent Labs Lab 06/16/15 0303 06/17/15 0510 06/18/15 0503 06/19/15 0926  CALCIUM 8.3* 8.2* 8.1* 8.0*  MG 2.2 2.6*  --   --   PHOS 6.2* 9.9*  --  11.4*    CBC  Recent Labs Lab 06/18/15 0503 06/19/15 0340 06/19/15 0926  WBC 15.7* 19.3* 19.3*  HGB 8.8* 9.4* 8.9*  HCT 28.1* 30.3* 28.3*  PLT 237 169 250    Coag's  Recent Labs Lab 06/15/15 0444 06/16/15 0303 06/17/15 0510  INR 1.27 1.21 1.21    Sepsis Markers No results for input(s): LATICACIDVEN, PROCALCITON, O2SATVEN in the  last 168 hours.  ABG  Recent Labs Lab 06/16/15 0533  PHART 7.442  PCO2ART 40.2  PO2ART 90.3    Liver Enzymes  Recent Labs Lab 06/13/15 1338 06/18/15 0503 06/19/15 0926  AST  --  27  --   ALT  --  23  --   ALKPHOS  --  56  --   BILITOT  --  0.9  --   ALBUMIN 2.7* 2.8* 2.5*    Cardiac Enzymes No results for input(s): TROPONINI, PROBNP in the last 168 hours.  Glucose  Recent Labs Lab 06/16/15 2019 06/17/15 0018 06/17/15 0415 06/17/15 0733 06/17/15 1400 06/18/15 2009  GLUCAP 110* 93 75 84 107* 91    Imaging Dg Chest Port 1 View  06/19/2015  CLINICAL DATA:  Recent pneumothorax with COPD EXAM: PORTABLE CHEST 1 VIEW COMPARISON:  June 17, 2015 FINDINGS: Central catheter tip is in the superior vena cava near the cavoatrial junction. There is a chest tube on the left. No pneumothorax apparent. There is patchy airspace consolidation in both lung bases, more on the right than on the left. There is also patchy airspace opacity in the right mid lung, slightly decreased compared to 2 days prior. No new opacity identified. Heart is mildly prominent with pulmonary vascularity stable  and within normal limits. No adenopathy apparent. IMPRESSION: No pneumothorax apparent. Persistent airspace opacity in the lung bases, more on the right than on the left. Patchy airspace opacity remains in the right mid lung, slightly less compared to 2 days prior. No new opacity. No change in cardiac silhouette. Electronically Signed   By: Lowella Grip III M.D.   On: 06/19/2015 07:13     STUDIES:  CT chest as noted: No PE.Multifocal pna r>l, minimal pnx  CULTURES: Urine 12/16>>> no UOP BC x 2 12/16>>>GNR>>>Carbapenem resistant klebsiella  Sputum 12/16>>>kleb, int ceftriaxone  ANTIBIOTICS: vanc 12/16>>>12/19 Cefepime 12/16>>>12/19 ceftaz 12/19>>>12/25 Flagyl 12/23>>12/25 Tygicycline 12/25>>> Aztreonam 12/25>>>  SIGNIFICANT EVENTS: 12/16 intubated, L tension ptx post intubation,  chest tube placed in ER  Extubated 12/19- worsening distress, HD, bipap, small PTX resolve dafter chest tube back to suction 12/20- weak resp status remains, failed high flow nasal cannula O2 reservoir  12/21- required intubation, bronch w/ bloody clots, no DAH 12/25-Extubated  LINES/TUBES: ETT 12/16>>>12/18>>>12/21>>>12/25 R chest perm cath >>> L chest tube 12/16>>> 12/28  DISCUSSION: 80yo CM presented to ED, assessed for SOB. Intubated in ED for worsening respiratory distress and increased oxygen requirements. Treated for differential which includes HCAP, viral illness, COPD exacerbation, and acute on chronic CHF c/b inadequate volume removal with incomplete HD.   ASSESSMENT / PLAN:  PULMONARY A: Acute hypoxemic respiratory failure 2/2 HCAP vs COPD exacerbation vs. CHF exacerbation AECOPD  L PTX- resolved Concern that pcxr does not explain hypoxia Klebsiella PNA - necrotizing? P:   Continue supplemental O2 by [place] and titrate to keep O2 sat>92% Will D/C chest tube  CXR post-chest tube removal Continue prednisone  Empiric antibiotics  Continue duoneb scheduled and prn  INFECTIOUS A:  Sepsis 2/2 HCAP, r/;o necrotizing process ID now doing ABX P:   Continue antibiotics per ID F/U cultures  Rest of treatment plan per primary team.   Will sign-off. Recall PCCM if need be.   FAMILY  - Updates: no family available  - Inter-disciplinary family meet or Palliative Care meeting due by:  12/29   Magdalene S. [facility] ANP-BC Pulmonary and Critical Care Medicine [facility] Pager: [phone]  06/19/2015, 11:58 AM   Attending:  I have seen and examined the patient with nurse practitioner/resident and agree with the note above.  My edits are in BOLD above.  We formulated the plan together and I elicited the following history.    Mr. Pinales has ESRD and a severe necrotizing pneumonia from a multi-drug resistant enterobacter species.  His oxygenation is perhaps a bit  worse this week, but his CXR has not necessarily worsened.  He has baseline COPD and had a pneumothorax after intubation.  On exam: He is weak, but comfortable Lungs clear, crackles bases, normal effort CV: RRR, no mgr  CXR > bibasilar atelectasis/infiltrates, Chest tube in place  Pneumothorax> resolved, we have removed the chest tube, will repeat CXR  Acute respiratory failure with hypoxemia> suspect this is due to necrotizing enterobacter disease and COPD, high risk for worsening.  Would remove volume as tolerated with HD next session incase there is a pulmonary edema component.  COPD > Severe airflow obstruction, air trapping on 02/2015 PFT.  Not wheezing now but this certainly complicates the picture. > will add brovana/pulmicort as he takes symbicort and spiriva at home, maintain duoneb as ordered  Will be available as needed, call if questions  Roselie Awkward, MD [place] PCCM Pager: [phone] Cell: [phone] After 3pm or if no response,  call [phone]

## 2015-06-19 NOTE — Progress Notes (Signed)
PT Cancellation Note  Patient Details Name: Terry [name] MRN: [id number] DOB: 12-19-1934   Cancelled Treatment:    Reason Eval/Treat Not Completed: Patient at procedure or test/unavailable, pt leaving for HD, will attempt back in the afternoon.    [place], [place] 06/19/2015, 9:05 AM

## 2015-06-19 NOTE — Progress Notes (Signed)
Physical Therapy Treatment Patient Details Name: Terry [name] MRN: [id number] DOB: Oct 31, 1934 Today's Date: 06/19/2015    History of Present Illness pt is a 81 y/o male with h/o stroke, HTN, COPD, CAD, afib and OA, admitted with 2 day h/o SOB with condition deteriorating in HD.12/21 on endotrachial tube    PT Comments    Pt sitting EOB with wife upon PT arrival, fatigued and did not feel like he could attempt transfer or sitting there ex but performed supine there ex with manual resistance from therapist. PT will continue to follow.   Follow Up Recommendations  SNF;Supervision/Assistance - 24 hour     Equipment Recommendations  Other (comment) (TBD)    Recommendations for Other Services       Precautions / Restrictions Precautions Precautions: Fall Precaution Comments: monitor HR Restrictions Weight Bearing Restrictions: No    Mobility  Bed Mobility Overal bed mobility: Needs Assistance;+2 for physical assistance Bed Mobility: Sit to Supine       Sit to supine: +2 for physical assistance;Mod assist   General bed mobility comments: pt sitting EOB with wife upon arrival, mod A to LE's for sit to supine and mod A +2 for scooting up and positioning in bed, pt assisted with bended knees and pushing through feet when cued  Transfers                 General transfer comment: not performed  Ambulation/Gait             General Gait Details: unable   Stairs            Wheelchair Mobility    Modified Rankin (Stroke Patients Only)       Balance                                    Cognition Arousal/Alertness: Awake/alert Behavior During Therapy: Flat affect Overall Cognitive Status: Difficult to assess                 General Comments: pt with minimal verbalization and gets aggravated with questions so difficult to accurately assess cognition, pt looks to wife to answer most questions    Exercises General Exercises -  Lower Extremity Heel Slides: AROM;Both;10 reps (with manual resistance to hip and knee ext) Straight Leg Raises: AAROM;10 reps;Supine;Both Other Exercises Other Exercises: elbow flexion/ extension with manual resistance, LUE only 10x Other Exercises: AROM shoulder flexion 5x bilaterally    General Comments General comments (skin integrity, edema, etc.): encouraged pt to remain sitting but he reports too fatigued and needs to lie back down      Pertinent Vitals/Pain Pain Assessment: Faces Faces Pain Scale: Hurts little more Pain Location: left shoulder with ROM due to chest tube removal today Pain Descriptors / Indicators: Aching Pain Intervention(s): Limited activity within patient's tolerance  HR in 110s with exercise, O2 sats 97%     Home Living                      Prior Function            PT Goals (current goals can now be found in the care plan section) Acute Rehab PT Goals Patient Stated Goal: to feel/breath better PT Goal Formulation: With patient Time For Goal Achievement: 06/24/15 Potential to Achieve Goals: Fair Progress towards PT goals: Progressing toward goals    Frequency  Min 2X/week  PT Plan Current plan remains appropriate    Co-evaluation             End of Session Equipment Utilized During Treatment: Oxygen Activity Tolerance: Patient limited by fatigue Patient left: in bed;with call bell/phone within reach;with family/visitor present     Time: [id number] PT Time Calculation (min) (ACUTE ONLY): 21 min  Charges:  $Therapeutic Exercise: 8-22 mins                    G Codes:     Leighton Roach, PT  Acute Rehab Services  [phone]  Leighton Roach 06/19/2015, 4:36 PM

## 2015-06-19 NOTE — Progress Notes (Signed)
[facility]ious [facility]   Reason for visit: Follow up on pneumonia  Interval History: he was started on bactrim, meropenem, inhaled colistin.    Physical Exam: Constitutional:  Filed Vitals:   06/19/15 1312 06/19/15 1410  BP: 124/56 124/59  Pulse: 99 98  Temp: 97.6 F (36.4 C)   Resp: 22    patient appears in NAD Respiratory: Normal respiratory effort; diffuse rhonchi bilateral Cardiovascular: RRR GI: soft, nt  Review of Systems: Respiratory: negative for dyspnea on exertion or stridor Gastrointestinal: negative for diarrhea  Lab Results  Component Value Date   WBC 19.3* 06/19/2015   HGB 8.9* 06/19/2015   HCT 28.3* 06/19/2015   MCV 97.3 06/19/2015   PLT 250 06/19/2015    Lab Results  Component Value Date   CREATININE 5.96* 06/19/2015   BUN 120* 06/19/2015   NA 135 06/19/2015   K 5.1 06/19/2015   CL 95* 06/19/2015   CO2 23 06/19/2015    Lab Results  Component Value Date   ALT 23 06/18/2015   AST 27 06/18/2015   ALKPHOS 56 06/18/2015     Microbiology: Recent Results (from the past 240 hour(s))  Culture, respiratory (NON-Expectorated)     Status: None   Collection Time: 06/12/15  3:03 PM  Result Value Ref Range Status   Specimen Description BRONCHIAL ALVEOLAR LAVAGE  Final   Special Requests NONE  Final   Gram Stain   Final    ABUNDANT WBC PRESENT, PREDOMINANTLY PMN NO SQUAMOUS EPITHELIAL CELLS SEEN NO ORGANISMS SEEN    Culture   Final    ABUNDANT ENTEROBACTER CLOACAE Note: TIGECYCLINE  R >=8 COLISTIN  .25ug/ml ETEST results for this drug are "FOR INVESTIGATIONAL USE ONLY" and should NOT be used for clinical purposes. CRE CRITICAL RESULT CALLED TO, READ BACK BY AND VERIFIED WITH: J BISHOP RN 11AM 06/16/15 GUSTK    Report Status 06/18/2015 FINAL  Final   Organism ID, Bacteria ENTEROBACTER CLOACAE  Final      Susceptibility   Enterobacter cloacae - MIC*    CEFAZOLIN Value in next row Resistant      >=64 RESISTANTPerformed at [facility]    CEFEPIME Value in next row Resistant      >=64 RESISTANTPerformed at [facility]    CEFTAZIDIME Value in next row Resistant      >=64 RESISTANTPerformed at [facility]    CEFTRIAXONE Value in next row Resistant      >=64 RESISTANTPerformed at [facility]    CIPROFLOXACIN Value in next row Sensitive      <=0.25 SENSITIVEPerformed at [facility] Value in next row Sensitive      <=1 SENSITIVEPerformed at [facility]    IMIPENEM Value in next row       RESISTANTPerformed at [facility]    PIP/TAZO Value in next row Resistant      >=128 RESISTANTPerformed at [facility]    TOBRAMYCIN Value in next row Sensitive      <=1 SENSITIVEPerformed at [facility]    TRIMETH/SULFA Value in next row Sensitive      <=20 SENSITIVE(NOTE)    AMIKACIN Value in next row Sensitive      <=20 SENSITIVE(NOTE)    AZTREONAM Value in next row Resistant      >=64 RESISTANTPerformed at [facility]    * ABUNDANT ENTEROBACTER CLOACAE    Impression/Plan:  1. Pneumonia - heavy growth  of Enterobacter, with multidrug resistance, on therapy with meropenem, Bactrim and inhaled colistin.  WBC elevated today compared to yesterday. Some desating noted during exam.   -will consider ceftazidime/avibactam if he worsens.

## 2015-06-19 NOTE — Progress Notes (Signed)
[facility] Progress Note  Assessment/Plan: 1. Resp failure/ COPD/ MDR enterobacter HCAP: extubated 12/25, currently on VM, Sats 96-99%. Pred/ abx/ nebs. Chest tube to be removed today. Meropenem/ bactrim.  2. ESRD - MWF at [place]. Had HD today. K+5.1 pre HD. Tolerated treatment well.  3. Anemia - Hgb 8.9. On ESA. Follow Hgb.  4. Secondary hyperparathyroidism - Ca  8.0 C Ca 9.2 Stop daily calcitriol. Resume hectoral with HD. Phos 11.4. Start renvela 1600 mg PO TID with meals.  5. Volume - HD today. Ran even. Post wt 79kg considerably under OP EDW. BP stable.  6. Nutrition - Albumin 2.5. Dysphagia I diet. Add prostat 7. Afib - on MTP 50 bid 8. HTN - on MTP only for afib    Rita H. Brown NP-C 06/19/2015, 4:46 PM  [facility] [phone]  Pt seen, examined and agree w A/P as above.  Kelly Splinter MD [facility] pager [phone]    cell [phone] 06/20/2015, 8:02 AM    Subjective: "I'm better..." Wife at bedside. Patient C/O discomfort of lying in bed. Denies SOB.  Contact isolation.   Objective Filed Vitals:   06/19/15 1300 06/19/15 1312 06/19/15 1410 06/19/15 1520  BP: 125/58 124/56 124/59   Pulse: 103 99 98   Temp:  97.6 F (36.4 C)    TempSrc:  Oral    Resp:  22    Height:      Weight:  79 kg (174 lb 2.6 oz)    SpO2:  99%  96%   Physical Exam General: chronically ill appearing male in NAD Heart: S1, S2. No R/G/M Lungs: bilateral breath sounds decreased in bases essentially clear. Resp. Shallow without WOB.  Abdomen: active BS. Nontender Extremities: No LE edema.  Dialysis Access: Maturing RUA AVF + bruit. R perm cath Drsg CDI.   Dialysis Orders: [facility] MWF  4h 2/2.25 bath 86kg Hep 8600 R IJ cath / RUE AVF(maturing) Venofer 100/ hd x 10  Hect 1 ug   Additional Objective Labs: Basic Metabolic Panel:  Recent Labs Lab 06/16/15 0303 06/17/15 0510 06/18/15 0503 06/19/15 0926  NA 139 139 137 135  K 3.6 4.3  4.4 5.1  CL 96* 98* 95* 95*  CO2 28 23 25 23   GLUCOSE 135* 88 98 108*  BUN 74* 143* 73* 120*  CREATININE 3.87* 6.20* 4.15* 5.96*  CALCIUM 8.3* 8.2* 8.1* 8.0*  PHOS 6.2* 9.9*  --  11.4*   Liver Function Tests:  Recent Labs Lab 06/13/15 1338 06/18/15 0503 06/19/15 0926  AST  --  27  --   ALT  --  23  --   ALKPHOS  --  56  --   BILITOT  --  0.9  --   PROT  --  5.7*  --   ALBUMIN 2.7* 2.8* 2.5*   No results for input(s): LIPASE, AMYLASE in the last 168 hours. CBC:  Recent Labs Lab 06/13/15 0227  06/16/15 0303 06/17/15 0510 06/18/15 0503 06/19/15 0340 06/19/15 0926  WBC 13.2*  < > 14.8* 16.7* 15.7* 19.3* 19.3*  NEUTROABS 12.0*  --   --   --   --   --  16.9*  HGB 8.1*  < > 8.4* 8.6* 8.8* 9.4* 8.9*  HCT 26.6*  < > 27.9* 28.2* 28.1* 30.3* 28.3*  MCV 98.9  < > 101.8* 98.6 99.6 98.4 97.3  PLT 278  < > 241 259 237 169 250  < > = values in this interval not displayed. Blood  Culture    Component Value Date/Time   SDES BRONCHIAL ALVEOLAR LAVAGE 06/12/2015 1503   SPECREQUEST NONE 06/12/2015 1503   CULT  06/12/2015 1503    ABUNDANT ENTEROBACTER CLOACAE Note: TIGECYCLINE  R >=8 COLISTIN  .25ug/ml ETEST results for this drug are "FOR INVESTIGATIONAL USE ONLY" and should NOT be used for clinical purposes. CRE CRITICAL RESULT CALLED TO, READ BACK BY AND VERIFIED WITH: J BISHOP RN 11AM 06/16/15 GUSTK    REPTSTATUS 06/18/2015 FINAL 06/12/2015 1503    Cardiac Enzymes: No results for input(s): CKTOTAL, CKMB, CKMBINDEX, TROPONINI in the last 168 hours. CBG:  Recent Labs Lab 06/17/15 0018 06/17/15 0415 06/17/15 0733 06/17/15 1400 06/18/15 2009  GLUCAP 93 75 84 107* 91   Iron Studies: No results for input(s): IRON, TIBC, TRANSFERRIN, FERRITIN in the last 72 hours. @lablastinr3 @ Studies/Results: Dg Chest Port 1 View  06/19/2015  CLINICAL DATA:  Chest tube removal. EXAM: PORTABLE CHEST 1 VIEW 3:33 p.m. COMPARISON:  Chest x-ray dated 06/19/2015 at 4:43 a.m. FINDINGS: Left  chest tube has been removed. No recurrence of pneumothorax. Slight atelectasis at the left lung base, unchanged. Improved infiltrate at the right lung base. Slight increased infiltrate in the right midzone. Double lumen central venous catheter in place, unchanged. IMPRESSION: 1. No pneumothorax after left chest tube removal. Persistent slight left base atelectasis. 2. Improved infiltrate at the right base. 3. Slightly increased hazy infiltrate in the right midzone. Electronically Signed   By: Lorriane Shire M.D.   On: 06/19/2015 15:45   Dg Chest Port 1 View  06/19/2015  CLINICAL DATA:  Recent pneumothorax with COPD EXAM: PORTABLE CHEST 1 VIEW COMPARISON:  June 17, 2015 FINDINGS: Central catheter tip is in the superior vena cava near the cavoatrial junction. There is a chest tube on the left. No pneumothorax apparent. There is patchy airspace consolidation in both lung bases, more on the right than on the left. There is also patchy airspace opacity in the right mid lung, slightly decreased compared to 2 days prior. No new opacity identified. Heart is mildly prominent with pulmonary vascularity stable and within normal limits. No adenopathy apparent. IMPRESSION: No pneumothorax apparent. Persistent airspace opacity in the lung bases, more on the right than on the left. Patchy airspace opacity remains in the right mid lung, slightly less compared to 2 days prior. No new opacity. No change in cardiac silhouette. Electronically Signed   By: Lowella Grip III M.D.   On: 06/19/2015 07:13   Medications:   . antiseptic oral rinse  7 mL Mouth Rinse BID  . arformoterol  15 mcg Nebulization BID  . atorvastatin  20 mg Oral q1800  . budesonide (PULMICORT) nebulizer solution  0.5 mg Nebulization BID  . calcitRIOL  0.5 mcg Oral Daily  . chlorhexidine gluconate  15 mL Mouth Rinse BID  . colistimethate  150 mg Inhalation BID  . darbepoetin (ARANESP) injection - DIALYSIS  200 mcg Intravenous Q Mon-HD  .  docusate sodium  100 mg Oral BID  . heparin subcutaneous  5,000 Units Subcutaneous 3 times per day  . ipratropium-albuterol  3 mL Nebulization TID  . ipratropium-albuterol      . meropenem (MERREM) IV  1 g Intravenous Q24H  . metoprolol tartrate  50 mg Oral BID  . multivitamin  1 tablet Oral QHS  . [START ON 06/20/2015] predniSONE  10 mg Oral Q breakfast  . sodium chloride  3 mL Intravenous Q12H  . sulfamethoxazole-trimethoprim  400 mg Intravenous Q24H

## 2015-06-20 DIAGNOSIS — J1569 Pneumonia due to other gram-negative bacteria: Secondary | ICD-10-CM | POA: Diagnosis present

## 2015-06-20 DIAGNOSIS — I10 Essential (primary) hypertension: Secondary | ICD-10-CM

## 2015-06-20 DIAGNOSIS — J156 Pneumonia due to other aerobic Gram-negative bacteria: Secondary | ICD-10-CM | POA: Diagnosis present

## 2015-06-20 LAB — COMPREHENSIVE METABOLIC PANEL
ALT: 24 U/L (ref 17–63)
ANION GAP: 11 (ref 5–15)
AST: 25 U/L (ref 15–41)
Albumin: 2.3 g/dL — ABNORMAL LOW (ref 3.5–5.0)
Alkaline Phosphatase: 55 U/L (ref 38–126)
BILIRUBIN TOTAL: 0.9 mg/dL (ref 0.3–1.2)
BUN: 50 mg/dL — AB (ref 6–20)
CHLORIDE: 98 mmol/L — AB (ref 101–111)
CO2: 30 mmol/L (ref 22–32)
Calcium: 7.9 mg/dL — ABNORMAL LOW (ref 8.9–10.3)
Creatinine, Ser: 4.08 mg/dL — ABNORMAL HIGH (ref 0.61–1.24)
GFR, EST AFRICAN AMERICAN: 15 mL/min — AB (ref >60)
GFR, EST NON AFRICAN AMERICAN: 13 mL/min — AB (ref >60)
Glucose, Bld: 96 mg/dL (ref 65–99)
POTASSIUM: 4.6 mmol/L (ref 3.5–5.1)
SODIUM: 139 mmol/L (ref 135–145)
TOTAL PROTEIN: 5.1 g/dL — AB (ref 6.5–8.1)

## 2015-06-20 LAB — CBC
HCT: 28.8 % — ABNORMAL LOW (ref 39.0–52.0)
HEMOGLOBIN: 8.7 g/dL — AB (ref 13.0–17.0)
MCH: 30.5 pg (ref 26.0–34.0)
MCHC: 30.2 g/dL (ref 30.0–36.0)
MCV: 101.1 fL — ABNORMAL HIGH (ref 78.0–100.0)
Platelets: 220 10*3/uL (ref 150–400)
RBC: 2.85 MIL/uL — AB (ref 4.22–5.81)
RDW: 21.6 % — ABNORMAL HIGH (ref 11.5–15.5)
WBC: 18 10*3/uL — ABNORMAL HIGH (ref 4.0–10.5)

## 2015-06-20 LAB — MAGNESIUM: MAGNESIUM: 2.1 mg/dL (ref 1.7–2.4)

## 2015-06-20 MED ORDER — WARFARIN - PHARMACIST DOSING INPATIENT: Freq: Every day | Status: DC

## 2015-06-20 MED ORDER — WARFARIN SODIUM 2 MG PO TABS
2.0000 mg | ORAL_TABLET | Freq: Once | ORAL | Status: AC
  Administered 2015-06-20: 2 mg via ORAL
  Filled 2015-06-20: qty 1

## 2015-06-20 NOTE — Progress Notes (Signed)
[facility] Progress Note  Assessment:  1. Resp failure/ COPD/ MDR enterobacter HCAP: extubated 12/25, currently on VM, Sats 96-99%. Pred/ abx/ nebs. Chest tube dc'd yesterday. Meropenem/ bactrim. CXR yest much improved from original 2. ESRD - MWF HD 3. Anemia - Hgb 8.9. On ESA. Follow Hgb.  4. Secondary hyperparathyroidism - Ca  8.0 C Ca 9.2 Stopped daily calcitriol. Resumed hectoral with HD. Phos 11.4. Start renvela 1600 mg PO TID with meals.  5. Volume - euvolemic, 7kg under prior dry wt 6. Nutrition - Albumin 2.5. Dysphagia I diet. Add prostat 7. Afib - on MTP 50 bid 8. HTN - on MTP only for afib   Plan - HD tomorrow, min UF. Mobilize.    Kelly Splinter MD [facility] pager [phone]    cell [phone] 06/20/2015, 10:56 AM    Subjective: feeling good, no cough or SOB.   Objective Filed Vitals:   06/20/15 0725 06/20/15 0730 06/20/15 0800 06/20/15 1018  BP:   131/71   Pulse:   109   Temp:   98 F (36.7 C)   TempSrc:   Oral   Resp:   21   Height:      Weight:      SpO2: 95% 97% 93% 96%   Physical Exam General: chronically ill appearing male in NAD Heart: S1, S2. No R/G/M Lungs: some basilar crackels, mostlyclear Abdomen: active BS. Nontender Extremities: No LE edema.  Dialysis Access: Maturing RUA AVF + bruit. R perm cath Drsg CDI.   Dialysis Orders: [facility] MWF  4h 2/2.25 bath 86kg Hep 8600 R IJ cath / RUE AVF(maturing) Venofer 100/ hd x 10  Hect 1 ug   Additional Objective Labs: Basic Metabolic Panel:  Recent Labs Lab 06/16/15 0303 06/17/15 0510 06/18/15 0503 06/19/15 0926 06/20/15 0910  NA 139 139 137 135 139  K 3.6 4.3 4.4 5.1 4.6  CL 96* 98* 95* 95* 98*  CO2 28 23 25 23 30   GLUCOSE 135* 88 98 108* 96  BUN 74* 143* 73* 120* 50*  CREATININE 3.87* 6.20* 4.15* 5.96* 4.08*  CALCIUM 8.3* 8.2* 8.1* 8.0* 7.9*  PHOS 6.2* 9.9*  --  11.4*  --    Liver Function Tests:  Recent Labs Lab 06/18/15 0503 06/19/15 0926  06/20/15 0910  AST 27  --  25  ALT 23  --  24  ALKPHOS 56  --  55  BILITOT 0.9  --  0.9  PROT 5.7*  --  5.1*  ALBUMIN 2.8* 2.5* 2.3*   No results for input(s): LIPASE, AMYLASE in the last 168 hours. CBC:  Recent Labs Lab 06/17/15 0510 06/18/15 0503 06/19/15 0340 06/19/15 0926 06/20/15 0329  WBC 16.7* 15.7* 19.3* 19.3* 18.0*  NEUTROABS  --   --   --  16.9*  --   HGB 8.6* 8.8* 9.4* 8.9* 8.7*  HCT 28.2* 28.1* 30.3* 28.3* 28.8*  MCV 98.6 99.6 98.4 97.3 101.1*  PLT 259 237 169 250 220   Blood Culture    Component Value Date/Time   SDES BRONCHIAL ALVEOLAR LAVAGE 06/12/2015 1503   SPECREQUEST NONE 06/12/2015 1503   CULT  06/12/2015 1503    ABUNDANT ENTEROBACTER CLOACAE Note: TIGECYCLINE  R >=8 COLISTIN  .25ug/ml ETEST results for this drug are "FOR INVESTIGATIONAL USE ONLY" and should NOT be used for clinical purposes. CRE CRITICAL RESULT CALLED TO, READ BACK BY AND VERIFIED WITH: J BISHOP RN 11AM 06/16/15 GUSTK    REPTSTATUS 06/18/2015 FINAL 06/12/2015 1503  Cardiac Enzymes: No results for input(s): CKTOTAL, CKMB, CKMBINDEX, TROPONINI in the last 168 hours. CBG:  Recent Labs Lab 06/17/15 0018 06/17/15 0415 06/17/15 0733 06/17/15 1400 06/18/15 2009  GLUCAP 93 75 84 107* 91   Iron Studies: No results for input(s): IRON, TIBC, TRANSFERRIN, FERRITIN in the last 72 hours. @lablastinr3 @ Studies/Results: Dg Chest Port 1 View  06/19/2015  CLINICAL DATA:  Chest tube removal. EXAM: PORTABLE CHEST 1 VIEW 3:33 p.m. COMPARISON:  Chest x-ray dated 06/19/2015 at 4:43 a.m. FINDINGS: Left chest tube has been removed. No recurrence of pneumothorax. Slight atelectasis at the left lung base, unchanged. Improved infiltrate at the right lung base. Slight increased infiltrate in the right midzone. Double lumen central venous catheter in place, unchanged. IMPRESSION: 1. No pneumothorax after left chest tube removal. Persistent slight left base atelectasis. 2. Improved infiltrate at the  right base. 3. Slightly increased hazy infiltrate in the right midzone. Electronically Signed   By: Lorriane Shire M.D.   On: 06/19/2015 15:45   Dg Chest Port 1 View  06/19/2015  CLINICAL DATA:  Recent pneumothorax with COPD EXAM: PORTABLE CHEST 1 VIEW COMPARISON:  June 17, 2015 FINDINGS: Central catheter tip is in the superior vena cava near the cavoatrial junction. There is a chest tube on the left. No pneumothorax apparent. There is patchy airspace consolidation in both lung bases, more on the right than on the left. There is also patchy airspace opacity in the right mid lung, slightly decreased compared to 2 days prior. No new opacity identified. Heart is mildly prominent with pulmonary vascularity stable and within normal limits. No adenopathy apparent. IMPRESSION: No pneumothorax apparent. Persistent airspace opacity in the lung bases, more on the right than on the left. Patchy airspace opacity remains in the right mid lung, slightly less compared to 2 days prior. No new opacity. No change in cardiac silhouette. Electronically Signed   By: Lowella Grip III M.D.   On: 06/19/2015 07:13   Medications:   . antiseptic oral rinse  7 mL Mouth Rinse BID  . arformoterol  15 mcg Nebulization BID  . atorvastatin  20 mg Oral q1800  . budesonide (PULMICORT) nebulizer solution  0.5 mg Nebulization BID  . chlorhexidine gluconate  15 mL Mouth Rinse BID  . colistimethate  150 mg Inhalation BID  . darbepoetin (ARANESP) injection - DIALYSIS  200 mcg Intravenous Q Mon-HD  . docusate sodium  100 mg Oral BID  . [START ON 06/21/2015] doxercalciferol  2 mcg Intravenous Q M,W,F-HD  . feeding supplement (PRO-STAT SUGAR FREE 64)  30 mL Oral BID  . heparin subcutaneous  5,000 Units Subcutaneous 3 times per day  . ipratropium-albuterol  3 mL Nebulization TID  . meropenem (MERREM) IV  1 g Intravenous Q24H  . metoprolol tartrate  50 mg Oral BID  . multivitamin  1 tablet Oral QHS  . predniSONE  10 mg Oral Q  breakfast  . sevelamer carbonate  1,600 mg Oral TID WC  . sodium chloride  3 mL Intravenous Q12H  . sulfamethoxazole-trimethoprim  400 mg Intravenous Q24H

## 2015-06-20 NOTE — Progress Notes (Signed)
[place] TEAM 1 - Stepdown/ICU TEAM Progress Note  KAILLOU CUPPETT [id number] DOB: 1934/12/26 DOA: 06/07/2015 PCP: [name], DO  Admit HPI / Brief Narrative: 80 y.o. WM PMHx ESRD on HD just started on HD 2 wks, A-Fib on anticoagulation,Chronic Diastolic CHF,CAD native artery, COPD, Chronic Respiratory Failure Discharged on December 13,   Admitted for acute on chronic renal failure. He was deemed to be end-stage renal disease and was started on dialysis. After discharge, patient was feeling well, but he did have a cough. The cough got worse over the last 2 days. He has been coughing up yellowish expectoration. Denies any fever per se. His breathing has become more labored. He went for his dialysis on Wednesday, which he tolerated well. And then he was going for his dialysis today when he felt more short of breath. During dialysis because patient became acutely short of breath and was noted to have low oxygen saturations. Patient was subsequently transported over to the emergency department. He complains of some right-sided chest pain. No nausea, vomiting. No difficulty with swallowing or choking spell with swallowing. No dizziness or lightheadedness. After getting breathing treatments in the emergency department he is feeling better. He was found to have a temperature of 7F, he was tachycardic, his sats were in the 70s when he presented to the hospital. Chest x-ray revealed bibasilar opacities. He has slightly elevated WBC. Presentation is highly suggestive of pneumonia. Hospitalist was called for admission. Patient still requiring 100% nonrebreather, although it could be titrated down   HPI/Subjective: 12/29  A/O 4, states significantly improved SOB, negative CP  Assessment/Plan: Acute hypoxemic respiratory failure -slowly improving  - extubated 12/25 - back to [place] at 4L today; slowly wean as able   Very drug-resistant Enterobacter cloacae HCAP ID and Pharm directing abx  selection/delivery   Lt  PTX -12/28 Left Chest Tube per PCCM;CXR stable   COPD w/ acute exacerbation  Acute exacerbation appears to have resolved - d/c steroid asap in face of severe pulm infection - PCCM states his PFTs suggest very severe COPD - should he decompensate again from pulm status, re-intubation is highly likely to be required   HTN  BP currently controlled   CAD s/p CABG Asymptomatic at this time  Chronic AFib on coumadin  -Rate controlled -Restart Coumadin at low dose, and DC if any hemoptysis recurs   Chronic grade 1 Diastolic heart failure No evidence of severe volume overload at this time - volume management per HD   ESRD on HD HD per Nephrology   Anemia of chronic disease Hgb holding steady   Hx of CVA  Code Status: FULL Family Communication: Wife present at time of exam Disposition Plan: Per ID    Consultants: Nephrology ID PCCM  Procedure/Significant Events: 12/16 intubated, L tension ptx post intubation, chest tube placed in ER  extubated   Culture Urine 12/16>>> no UOP BC x 2 12/16>>> Sputum 12/16>>>klieb, int ceftriaxone   Antibiotics: vanc 12/16>>>12/19 Cefepime 12/16>>>12/19 ceftaz 12/19>>>stop 12/23 Meropenem 12/28>> Bactrim 12/28>>   DVT prophylaxis: Coumadin   Devices   LINES / TUBES:  ETT 12/16>>>12/18 R chest perm cath >>> L chest tube 12/16>>>     Continuous Infusions:   Objective: VITAL SIGNS: Temp: 98 F (36.7 C) (12/29 1600) Temp Source: Oral (12/29 1600) BP: 134/57 mmHg (12/29 2003) Pulse Rate: 94 (12/29 2003) SPO2; FIO2:  No intake or output data in the 24 hours ending 06/20/15 2134   Exam: General: A/O 4, positive acute  respiratory distress Eyes: Negative headache, eye pain, double vision,negative scleral hemorrhage ENT: Negative Runny nose, negative ear pain, negative gingival bleeding, Neck:  Negative scars, masses, torticollis, lymphadenopathy, JVD Lungs: positive diffuse  rhonchi with mild expiratory wheezes, negative crackles Cardiovascular: Irregular irregular rhythm and rate without murmur gallop or rub normal S1 and S2 Abdomen:negative abdominal pain, nondistended, positive soft, bowel sounds, no rebound, no ascites, no appreciable mass Extremities: No significant cyanosis, clubbing, or edema bilateral lower extremities Psychiatric:  Negative depression, negative anxiety, negative fatigue, negative mania  Neurologic:  Cranial nerves II through XII intact, tongue/uvula midline, all extremities muscle strength 5/5, sensation intact throughout, negative dysarthria, negative expressive aphasia, negative receptive aphasia.   Data Reviewed: Basic Metabolic Panel:  Recent Labs Lab 06/16/15 0303 06/17/15 0510 06/18/15 0503 06/19/15 0926 06/20/15 0910  NA 139 139 137 135 139  K 3.6 4.3 4.4 5.1 4.6  CL 96* 98* 95* 95* 98*  CO2 28 23 25 23 30   GLUCOSE 135* 88 98 108* 96  BUN 74* 143* 73* 120* 50*  CREATININE 3.87* 6.20* 4.15* 5.96* 4.08*  CALCIUM 8.3* 8.2* 8.1* 8.0* 7.9*  MG 2.2 2.6*  --   --  2.1  PHOS 6.2* 9.9*  --  11.4*  --    Liver Function Tests:  Recent Labs Lab 06/18/15 0503 06/19/15 0926 06/20/15 0910  AST 27  --  25  ALT 23  --  24  ALKPHOS 56  --  55  BILITOT 0.9  --  0.9  PROT 5.7*  --  5.1*  ALBUMIN 2.8* 2.5* 2.3*   No results for input(s): LIPASE, AMYLASE in the last 168 hours. No results for input(s): AMMONIA in the last 168 hours. CBC:  Recent Labs Lab 06/17/15 0510 06/18/15 0503 06/19/15 0340 06/19/15 0926 06/20/15 0329  WBC 16.7* 15.7* 19.3* 19.3* 18.0*  NEUTROABS  --   --   --  16.9*  --   HGB 8.6* 8.8* 9.4* 8.9* 8.7*  HCT 28.2* 28.1* 30.3* 28.3* 28.8*  MCV 98.6 99.6 98.4 97.3 101.1*  PLT 259 237 169 250 220   Cardiac Enzymes: No results for input(s): CKTOTAL, CKMB, CKMBINDEX, TROPONINI in the last 168 hours. BNP (last 3 results)  Recent Labs  05/13/15 2017 06/08/15 0016  BNP 362.1* 892.3*    ProBNP  (last 3 results) No results for input(s): PROBNP in the last 8760 hours.  CBG:  Recent Labs Lab 06/17/15 0018 06/17/15 0415 06/17/15 0733 06/17/15 1400 06/18/15 2009  GLUCAP 93 75 84 107* 91    Recent Results (from the past 240 hour(s))  Culture, respiratory (NON-Expectorated)     Status: None   Collection Time: 06/12/15  3:03 PM  Result Value Ref Range Status   Specimen Description BRONCHIAL ALVEOLAR LAVAGE  Final   Special Requests NONE  Final   Gram Stain   Final    ABUNDANT WBC PRESENT, PREDOMINANTLY PMN NO SQUAMOUS EPITHELIAL CELLS SEEN NO ORGANISMS SEEN    Culture   Final    ABUNDANT ENTEROBACTER CLOACAE Note: TIGECYCLINE  R >=8 COLISTIN  .25ug/ml ETEST results for this drug are "FOR INVESTIGATIONAL USE ONLY" and should NOT be used for clinical purposes. CRE CRITICAL RESULT CALLED TO, READ BACK BY AND VERIFIED WITH: J BISHOP RN 11AM 06/16/15 GUSTK    Report Status 06/18/2015 FINAL  Final   Organism ID, Bacteria ENTEROBACTER CLOACAE  Final      Susceptibility   Enterobacter cloacae - MIC*    CEFAZOLIN Value in next row  Resistant      >=64 RESISTANTPerformed at [facility]    CEFEPIME Value in next row Resistant      >=64 RESISTANTPerformed at [facility]    CEFTAZIDIME Value in next row Resistant      >=64 RESISTANTPerformed at [facility]    CEFTRIAXONE Value in next row Resistant      >=64 RESISTANTPerformed at [facility]    CIPROFLOXACIN Value in next row Sensitive      <=0.25 SENSITIVEPerformed at [place] Value in next row Sensitive      <=1 SENSITIVEPerformed at [facility]    IMIPENEM Value in next row       RESISTANTPerformed at [facility]    PIP/TAZO Value in next row Resistant      >=128 RESISTANTPerformed at [facility]    TOBRAMYCIN Value in next row Sensitive      <=1 SENSITIVEPerformed at [facility]    TRIMETH/SULFA Value in next row Sensitive       <=20 SENSITIVE(NOTE)    AMIKACIN Value in next row Sensitive      <=20 SENSITIVE(NOTE)    AZTREONAM Value in next row Resistant      >=64 RESISTANTPerformed at [facility]    * ABUNDANT ENTEROBACTER CLOACAE     Studies:  Recent x-ray studies have been reviewed in detail by the Attending Physician  Scheduled Meds:  Scheduled Meds: . antiseptic oral rinse  7 mL Mouth Rinse BID  . arformoterol  15 mcg Nebulization BID  . atorvastatin  20 mg Oral q1800  . budesonide (PULMICORT) nebulizer solution  0.5 mg Nebulization BID  . chlorhexidine gluconate  15 mL Mouth Rinse BID  . colistimethate  150 mg Inhalation BID  . darbepoetin (ARANESP) injection - DIALYSIS  200 mcg Intravenous Q Mon-HD  . docusate sodium  100 mg Oral BID  . [START ON 06/21/2015] doxercalciferol  2 mcg Intravenous Q M,W,F-HD  . feeding supplement (PRO-STAT SUGAR FREE 64)  30 mL Oral BID  . ipratropium-albuterol  3 mL Nebulization TID  . meropenem (MERREM) IV  1 g Intravenous Q24H  . metoprolol tartrate  50 mg Oral BID  . multivitamin  1 tablet Oral QHS  . predniSONE  10 mg Oral Q breakfast  . sevelamer carbonate  1,600 mg Oral TID WC  . sodium chloride  3 mL Intravenous Q12H  . sulfamethoxazole-trimethoprim  400 mg Intravenous Q24H  . Warfarin - Pharmacist Dosing Inpatient   Does not apply q1800    Time spent on care of this patient: 40 mins   Murel Wigle, Geraldo Docker , MD  [facility] Office  [phone] Pager - [phone]  On-Call/Text Page:      [email or web address]      password [email or web address]  If 7PM-7AM, please contact night-coverage [email or web address] Password [email or web address] 06/20/2015, 9:34 PM   LOS: 13 days   Care during the described time interval was provided by me .  I have reviewed this patient's available data, including medical history, events of note, physical examination, and all test results as part of my evaluation. I have personally reviewed and interpreted all radiology studies.   Dia Crawford,  MD [phone] Pager

## 2015-06-20 NOTE — Progress Notes (Signed)
[facility] for Infectious Disease   Reason for visit: Follow up on pneumonia  Interval History: he continues on bactrim, meropenem, inhaled colistin.    Physical Exam: Constitutional:  Filed Vitals:   06/20/15 0000 06/20/15 0400  BP: 116/48 124/59  Pulse: 100 94  Temp: 98.4 F (36.9 C) 98.2 F (36.8 C)  Resp: 18 14   patient appears in NAD Respiratory: Normal respiratory effort; less rhonchi Cardiovascular: RRR GI: soft, nt  Review of Systems: Respiratory: negative for dyspnea on exertion or stridor Gastrointestinal: negative for diarrhea  Lab Results  Component Value Date   WBC 18.0* 06/20/2015   HGB 8.7* 06/20/2015   HCT 28.8* 06/20/2015   MCV 101.1* 06/20/2015   PLT 220 06/20/2015    Lab Results  Component Value Date   CREATININE 5.96* 06/19/2015   BUN 120* 06/19/2015   NA 135 06/19/2015   K 5.1 06/19/2015   CL 95* 06/19/2015   CO2 23 06/19/2015    Lab Results  Component Value Date   ALT 23 06/18/2015   AST 27 06/18/2015   ALKPHOS 56 06/18/2015     Microbiology: Recent Results (from the past 240 hour(s))  Culture, respiratory (NON-Expectorated)     Status: None   Collection Time: 06/12/15  3:03 PM  Result Value Ref Range Status   Specimen Description BRONCHIAL ALVEOLAR LAVAGE  Final   Special Requests NONE  Final   Gram Stain   Final    ABUNDANT WBC PRESENT, PREDOMINANTLY PMN NO SQUAMOUS EPITHELIAL CELLS SEEN NO ORGANISMS SEEN    Culture   Final    ABUNDANT ENTEROBACTER CLOACAE Note: TIGECYCLINE  R >=8 COLISTIN  .25ug/ml ETEST results for this drug are "FOR INVESTIGATIONAL USE ONLY" and should NOT be used for clinical purposes. CRE CRITICAL RESULT CALLED TO, READ BACK BY AND VERIFIED WITH: J BISHOP RN 11AM 06/16/15 GUSTK    Report Status 06/18/2015 FINAL  Final   Organism ID, Bacteria ENTEROBACTER CLOACAE  Final      Susceptibility   Enterobacter cloacae - MIC*    CEFAZOLIN Value in next row Resistant      >=64 RESISTANTPerformed at  [facility]    CEFEPIME Value in next row Resistant      >=64 RESISTANTPerformed at [facility]    CEFTAZIDIME Value in next row Resistant      >=64 RESISTANTPerformed at [facility]    CEFTRIAXONE Value in next row Resistant      >=64 RESISTANTPerformed at [facility]    CIPROFLOXACIN Value in next row Sensitive      <=0.25 SENSITIVEPerformed at [place] Value in next row Sensitive      <=1 SENSITIVEPerformed at [facility]    IMIPENEM Value in next row       RESISTANTPerformed at [facility]    PIP/TAZO Value in next row Resistant      >=128 RESISTANTPerformed at [facility]    TOBRAMYCIN Value in next row Sensitive      <=1 SENSITIVEPerformed at [facility]    TRIMETH/SULFA Value in next row Sensitive      <=20 SENSITIVE(NOTE)    AMIKACIN Value in next row Sensitive      <=20 SENSITIVE(NOTE)    AZTREONAM Value in next row Resistant      >=64 RESISTANTPerformed at [facility]    * ABUNDANT ENTEROBACTER CLOACAE    Impression/Plan:  1. Pneumonia - heavy  growth of Enterobacter, with multidrug resistance, on therapy with meropenem, Bactrim and inhaled colistin.  WBC slight improvement and pt looks much better today.  -continue current antibiotics

## 2015-06-20 NOTE — Progress Notes (Signed)
Occupational Therapy Treatment Patient Details Name: Terry [name] MRN: [id number] DOB: [birth year] Today's Date: 06/20/2015    History of present illness pt is a 80 y/o male with h/o stroke, HTN, COPD, CAD, afib and OA, admitted with 2 day h/o SOB with condition deteriorating in HD.12/21 on endotrachial tube, extubated 12/25. Chest tube removed 12/28.   OT comments  Focus of session on UB strengthening at bed level with level 1 theraband. Sat EOB x 5 minutes with support of UEs on table.  Pt has been routinely sitting up with wife's help for meals with wife supporting pt's back when fatigued.    Follow Up Recommendations  SNF    Equipment Recommendations       Recommendations for Other Services      Precautions / Restrictions Precautions Precautions: Fall Precaution Comments: monitor HR       Mobility Bed Mobility   Bed Mobility: Supine to Sit;Sit to Supine     Supine to sit: Mod assist;HOB elevated Sit to supine: Min assist   General bed mobility comments: pt has been routinely sitting EOB with wife's help for meals, needs support in sitting due to fatigue  Transfers                 General transfer comment: pt declining OOB    Balance     Sitting balance-Leahy Scale: Fair Sitting balance - Comments: sat EOB x 5 minutes with UEs propped on table                           ADL                                                Vision                     Perception     Praxis      Cognition   Behavior During Therapy: Flat affect Overall Cognitive Status: Impaired/Different from baseline Area of Impairment: Attention;Memory;Orientation Orientation Level: Disoriented to;Time Current Attention Level: Sustained Memory: Decreased short-term memory    Safety/Judgement: Decreased awareness of safety     General Comments: pt anxious with wife out of room, doesn't like to be questioned, but would converse about TV  program    Extremity/Trunk Assessment               Exercises Other Exercises Other Exercises: level 1 tband x 10 B UEs--shoulder flex, extension, horizontal abd/add, elbow flexion and extension. Tolerated well.   Shoulder Instructions       General Comments      Pertinent Vitals/ Pain       Pain Assessment: No/denies pain  Home Living                                          Prior Functioning/Environment              Frequency Min 2X/week     Progress Toward Goals  OT Goals(current goals can now be found in the care plan section)  Progress towards OT goals: Progressing toward goals  Acute Rehab OT Goals Patient Stated Goal: to feel/breath better  Plan Discharge plan remains appropriate  Co-evaluation                 End of Session Equipment Utilized During Treatment: Oxygen   Activity Tolerance Patient tolerated treatment well   Patient Left in bed;with call bell/phone within reach   Nurse Communication          Time: 1440-1505 OT Time Calculation (min): 25 min  Charges: OT General Charges $OT Visit: 1 Procedure OT Treatments $Therapeutic Activity: 8-22 mins $Therapeutic Exercise: 8-22 mins  Malka So 06/20/2015, 3:14 PM  [phone]

## 2015-06-20 NOTE — Progress Notes (Signed)
ANTICOAGULATION CONSULT NOTE - Initial Consult  Pharmacy Consult:  Coumadin Indication: atrial fibrillation  Allergies  Allergen Reactions  . Yellow Dyes (Non-Tartrazine) Other (See Comments)    Burns arms Cough medications (tussionex and benzonatate ok)  . Levaquin [Levofloxacin In D5w] Itching    Patient Measurements: Height: 5\' 7"  (170.2 cm) Weight: 174 lb 6.1 oz (79.1 kg) IBW/kg (Calculated) : 66.1  Vital Signs: Temp: 98 F (36.7 C) (12/29 0800) Temp Source: Oral (12/29 0800) BP: 131/71 mmHg (12/29 0800) Pulse Rate: 109 (12/29 0800)  Labs:  Recent Labs  06/18/15 0503 06/19/15 0340 06/19/15 0926 06/20/15 0329 06/20/15 0910  HGB 8.8* 9.4* 8.9* 8.7*  --   HCT 28.1* 30.3* 28.3* 28.8*  --   PLT 237 169 250 220  --   CREATININE 4.15*  --  5.96*  --  4.08*    Estimated Creatinine Clearance: 14 mL/min (by C-G formula based on Cr of 4.08).   Medical History: Past Medical History  Diagnosis Date  . Stroke ([place]) 2012  . High cholesterol   . Hypertension   . Kidney disease     CKD4, sees [facility], considering dialysis  . COPD (chronic obstructive pulmonary disease) ([place])   . CAD (coronary artery disease) [place], [name]  . Acute and chronic respiratory failure 10/22/2012  . Osteoarthritis 10/22/2012  . Pneumonia April 2014  . Atrial fibrillation ([place])        Assessment: 51 YOM with history of Afib continued on Coumadin from PTA upon admit.  Anticoagulation held on 06/12/15 after finding of bleeding into lungs during bronch.  Chest tube removed on 06/19/15 and Coumadin to resume today.  Patient is coughing up old blood sometimes but okay to resume Coumadin per MD.  His INR was sub-therapeutic on 06/17/15.  Noted he is currently on Septra, which could increase patient's INR.  Home Coumadin dose:  4mg  PO daily   Goal of Therapy:  INR 2-3    Plan:  - Coumadin 2mg  PO today.  Start conservatively to evaluate Septra's effect on Coumadin. - Daily  PT / INR - Monitor closely for s/sx of bleeding   Nayvie Lips D. Mina Marble, PharmD, BCPS Pager:  [phone] 06/20/2015, 12:50 PM

## 2015-06-21 LAB — PROTIME-INR
INR: 0.95 (ref 0.00–1.49)
Prothrombin Time: 12.9 seconds (ref 11.6–15.2)

## 2015-06-21 MED ORDER — SODIUM CHLORIDE 0.9 % IV SOLN: 100.0000 mL | INTRAVENOUS | Status: DC | PRN

## 2015-06-21 MED ORDER — ALTEPLASE 2 MG IJ SOLR: 2.0000 mg | Freq: Once | INTRAMUSCULAR | Status: DC | PRN

## 2015-06-21 MED ORDER — PENTAFLUOROPROP-TETRAFLUOROETH EX AERO: 1.0000 "application " | INHALATION_SPRAY | CUTANEOUS | Status: DC | PRN

## 2015-06-21 MED ORDER — WARFARIN SODIUM 2 MG PO TABS
2.0000 mg | ORAL_TABLET | Freq: Once | ORAL | Status: AC
  Administered 2015-06-21: 2 mg via ORAL
  Filled 2015-06-21: qty 1

## 2015-06-21 MED ORDER — DOXERCALCIFEROL 4 MCG/2ML IV SOLN
INTRAVENOUS | Status: AC
  Administered 2015-06-21: 2 ug via INTRAVENOUS
  Filled 2015-06-21: qty 2

## 2015-06-21 MED ORDER — LIDOCAINE-PRILOCAINE 2.5-2.5 % EX CREA
1.0000 "application " | TOPICAL_CREAM | CUTANEOUS | Status: DC | PRN
  Filled 2015-06-21: qty 5

## 2015-06-21 MED ORDER — HEPARIN SODIUM (PORCINE) 1000 UNIT/ML DIALYSIS
1000.0000 [IU] | INTRAMUSCULAR | Status: DC | PRN
  Filled 2015-06-21: qty 1

## 2015-06-21 MED ORDER — HEPARIN SODIUM (PORCINE) 1000 UNIT/ML DIALYSIS
3000.0000 [IU] | INTRAMUSCULAR | Status: DC | PRN
  Filled 2015-06-21: qty 3

## 2015-06-21 MED ORDER — LIDOCAINE HCL (PF) 1 % IJ SOLN: 5.0000 mL | INTRAMUSCULAR | Status: DC | PRN

## 2015-06-21 NOTE — Progress Notes (Signed)
PT Cancellation Note  Patient Details Name: BRAISON WOOLMAN MRN: [id number] DOB: [birth year]   Cancelled Treatment:    Reason Eval/Treat Not Completed: Patient at procedure or test/unavailable (Pt in Hemodialysis.  Will return as able.  Thanks.)   Irwin Brakeman F 06/21/2015, 9:21 AM [facility] [phone] (pager)

## 2015-06-21 NOTE — Progress Notes (Signed)
ANTIBIOTIC CONSULT NOTE  Pharmacy Consult for Merrem, Septra, Inhaled colistin Indication: Pneumonia  Allergies  Allergen Reactions  . Yellow Dyes (Non-Tartrazine) Other (See Comments)    Burns arms Cough medications (tussionex and benzonatate ok)  . Levaquin [Levofloxacin In D5w] Itching    Patient Measurements: Height: 5\' 7"  (170.2 cm) Weight: 178 lb 12.7 oz (81.1 kg) IBW/kg (Calculated) : 66.1  Vital Signs: Temp: 98 F (36.7 C) (12/30 0825) Temp Source: Axillary (12/30 1206) BP: 124/61 mmHg (12/30 1206) Pulse Rate: 98 (12/30 1206) Intake/Output from previous day:   Intake/Output from this shift:    Labs:  Recent Labs  06/19/15 0340 06/19/15 0926 06/20/15 0329 06/20/15 0910  WBC 19.3* 19.3* 18.0*  --   HGB 9.4* 8.9* 8.7*  --   PLT 169 250 220  --   CREATININE  --  5.96*  --  4.08*   Estimated Creatinine Clearance: 15.2 mL/min (by C-G formula based on Cr of 4.08). No results for input(s): VANCOTROUGH, VANCOPEAK, VANCORANDOM, GENTTROUGH, GENTPEAK, GENTRANDOM, TOBRATROUGH, TOBRAPEAK, TOBRARND, AMIKACINPEAK, AMIKACINTROU, AMIKACIN in the last 72 hours.    Assessment: 43 yom with ESRD. Pt is on abx for necrotizing klebsiella pneumonia. Now with potential CRE Kleb Pneumo. Patient has ESRD and on iHD Tu/Th/Sa. Discussed with labs and it has these MIC:  Imipenem = 1 Merropenem = <0.25 Doripenem = 1  Pt continues on triple therapy with meropenem, bactrim and inhaled colitin. WBC is down slightly as of 12/29 at 18.   Vancomycin 12/16 > 12/19 Cefepime 12/16 > 12/19 Ceftazidime 12/19 >>12/25 Flagyl 12/23 >> 12/25 Aztreonam 12/25>>12/27 Tigecycline 12/25>>12/27  Inhaled colistin 12/27>> Merrem 12/27>> Septra 12/27>>  12/17: Trach asp>>klebsiella oxytoca  12/21: Resp: GNR 12/16 blood: ngF 12/17 blood: ngF 12/21 resp: abundant enterobacter -- grew while on ceftaz   Goal of Therapy:  Resolution of infection    Plan:  - Continue Septra 400mg  IV q24 -  Continue Merrem 1g IV q24 - Continue Inhaled colistin 150mg  IH BID - F/u renal plans, C&S, clinical status and ID recs  Salome Arnt, PharmD, BCPS Pager # [phone] 06/21/2015 12:29 PM

## 2015-06-21 NOTE — Progress Notes (Signed)
ANTICOAGULATION CONSULT NOTE   Pharmacy Consult:  Coumadin Indication: atrial fibrillation  Allergies  Allergen Reactions  . Yellow Dyes (Non-Tartrazine) Other (See Comments)    Burns arms Cough medications (tussionex and benzonatate ok)  . Levaquin [Levofloxacin In D5w] Itching    Patient Measurements: Height: 5\' 7"  (170.2 cm) Weight: 178 lb 12.7 oz (81.1 kg) IBW/kg (Calculated) : 66.1  Vital Signs: Temp: 98 F (36.7 C) (12/30 0825) Temp Source: Axillary (12/30 1206) BP: 124/61 mmHg (12/30 1206) Pulse Rate: 98 (12/30 1206)  Labs:  Recent Labs  06/19/15 0340 06/19/15 0926 06/20/15 0329 06/20/15 0910 06/21/15 0311  HGB 9.4* 8.9* 8.7*  --   --   HCT 30.3* 28.3* 28.8*  --   --   PLT 169 250 220  --   --   LABPROT  --   --   --   --  12.9  INR  --   --   --   --  0.95  CREATININE  --  5.96*  --  4.08*  --     Estimated Creatinine Clearance: 15.2 mL/min (by C-G formula based on Cr of 4.08).  Assessment: 33 YOM with history of Afib continued on Coumadin from PTA upon admit. Anticoagulation held on 06/12/15 after finding of bleeding into lungs during bronch. Chest tube removed on 06/19/15 and Coumadin resumed 12/29. INR remains low at 0.95 as expected. No new CBC today. Of note, he continues on bactrim which is a signficant interaction with coumadin. Will require close monitoring.  Home Coumadin dose:  4mg  PO daily  Goal of Therapy:  INR 2-3   Plan:  - Repeat warfarin 2mg  PO x 1 tonight - Daily INR  Salome Arnt, PharmD, BCPS Pager # [phone] 06/21/2015 12:31 PM

## 2015-06-21 NOTE — Progress Notes (Signed)
Physical Therapy Treatment Patient Details Name: Terry [name] MRN: [id number] DOB: 05-Aug-1934 Today's Date: 06/21/2015    History of Present Illness pt is a 80 y/o male with h/o stroke, HTN, COPD, CAD, afib and OA, admitted with 2 day h/o SOB with condition deteriorating in HD.12/21 on endotrachial tube, extubated 12/25. Chest tube removed 12/28.    PT Comments    Pt admitted with above diagnosis. Pt currently with functional limitations due to balance and endurance deficits as well as strength deficits.  Pt refused to ambulate but finally agreed to sit in chair.  Took incr time to assist pt to chair.  Pt tolerated fairly well.  Will progress pt as pt allows.  Pt will benefit from skilled PT to increase their independence and safety with mobility to allow discharge to the venue listed below.    Follow Up Recommendations  SNF;Supervision/Assistance - 24 hour     Equipment Recommendations  Other (comment) (TBD)    Recommendations for Other Services       Precautions / Restrictions Precautions Precautions: Fall Precaution Comments: monitor HR Restrictions Weight Bearing Restrictions: No    Mobility  Bed Mobility Overal bed mobility: Needs Assistance;+2 for physical assistance Bed Mobility: Supine to Sit     Supine to sit: Mod assist;HOB elevated     General bed mobility comments: took incr time and mod assist to come to EOB.  Pt took several attempts to push himself up from sidelyiing and ultimately needed assist.   Transfers Overall transfer level: Needs assistance Equipment used: Rolling walker (2 wheeled) Transfers: Sit to/from [facility] Sit to Stand: Mod assist;+2 physical assistance;From elevated surface Stand pivot transfers: Mod assist;+2 physical assistance       General transfer comment: Needed max cues for hand placement.  Pt needed assist with pad to bring bottom off of bed.  Once up, needed steadying assist to take pivotal steps around to  chair.  Needed assist to control descent into chair as well.   Ambulation/Gait                 Stairs            Wheelchair Mobility    Modified Rankin (Stroke Patients Only)       Balance Overall balance assessment: Needs assistance Sitting-balance support: Bilateral upper extremity supported;Feet supported Sitting balance-Leahy Scale: Poor Sitting balance - Comments: sat EOB x 5 minutes with UEs propped on bed   Standing balance support: Bilateral upper extremity supported;During functional activity Standing balance-Leahy Scale: Poor Standing balance comment: relies on UE support of 2 persons for transfer as he could not use RW appropriately.                     Cognition Arousal/Alertness: Awake/alert Behavior During Therapy: Flat affect Overall Cognitive Status: Impaired/Different from baseline Area of Impairment: Attention;Memory;Orientation Orientation Level: Disoriented to;Time Current Attention Level: Sustained Memory: Decreased short-term memory   Safety/Judgement: Decreased awareness of safety   Problem Solving: Requires verbal cues;Requires tactile cues General Comments: Pt needed max encouragement to participate as originally he was refusing, doesn't like to be questioned, but would converse about TV program    Exercises General Exercises - Lower Extremity Ankle Circles/Pumps: AROM;Both;10 reps;Seated Long Arc Quad: AROM;Both;10 reps;Seated Hip Flexion/Marching: AROM;Both;10 reps;Seated    General Comments General comments (skin integrity, edema, etc.): Pt did not want to get up to chair but with max encouragement agreed.       Pertinent Vitals/Pain Pain  Assessment: Faces Faces Pain Scale: Hurts little more Pain Location: abdomen Pain Descriptors / Indicators: Aching Pain Intervention(s): Limited activity within patient's tolerance;Monitored during session;Repositioned  HR 118-130 bpm, 126/64, 87-91% O2 on 40% face mask with  activity.  89-91% on 40% face mask at rest.  Nurse aware.     Home Living                      Prior Function            PT Goals (current goals can now be found in the care plan section) Progress towards PT goals: Progressing toward goals    Frequency  Min 2X/week    PT Plan Current plan remains appropriate    Co-evaluation             End of Session Equipment Utilized During Treatment: Gait belt;Oxygen Activity Tolerance: Patient limited by fatigue Patient left: in chair;with call bell/phone within reach     Time: [id number] PT Time Calculation (min) (ACUTE ONLY): 15 min  Charges:  $Therapeutic Activity: 8-22 mins                    G CodesDenice Paradise 06/30/2015, 3:06 PM  [facility] [phone] (pager)

## 2015-06-21 NOTE — Progress Notes (Signed)
Speech Language Pathology Treatment: Dysphagia  Patient Details Name: Terry [name] MRN: [id number] DOB: 02/22/1935 Today's Date: 06/21/2015 Time: [id number] SLP Time Calculation (min) (ACUTE ONLY): 9 min  Assessment / Plan / Recommendation Clinical Impression  Pt lethargic but agreeable to small amounts of POs. SLP administered spoonfuls of nectar thick liquids with assist provided for brief removal of ventimask for pt to accept boluses. VS remained stable and no overt coughing was noted; however, pt needed Mod cues to perform an incomplete chin tuck with resultant wet vocal quality concerning for penetration and/or aspiration. Cued cough effectively return voice to its baseline. RN reports improved ability to perform chin tuck when he was OOB and more alert. Will continue to follow for readiness to advance with improved alertness.   HPI HPI: 80yo male with hx ESRD, AFib, COPD, CVA presented 12/16 to the ED with a 2 day history of progressive SOB and cough with yellow sputum. Had worsening respiratory status while at HD and EMS was called. Diagnosed with PNA vs COPD exacerbation vs CHF exacerbation. Intubated 12/16-12/18, then again 12/21-12/25.      SLP Plan  Continue with current plan of care     Recommendations  Diet recommendations: Dysphagia 1 (puree);Nectar-thick liquid Liquids provided via: Cup Medication Administration: Crushed with puree Supervision: Patient able to self feed;Intermittent supervision to cue for compensatory strategies Compensations: Slow rate;Small sips/bites;Chin tuck Postural Changes and/or Swallow Maneuvers: Seated upright 90 degrees              Oral Care Recommendations: Oral care BID Follow up Recommendations: Skilled Nursing facility Plan: Continue with current plan of care   Germain Osgood, M.A. CCC-SLP [phone]  Germain Osgood 06/21/2015, 3:31 PM

## 2015-06-21 NOTE — Progress Notes (Addendum)
[facility] Progress Note  Assessment:  1. Resp failure/ COPD/ MDR enterobacter HCAP: extubated 12/25, on VM, Sats 96-99%. Pred/ abx/ nebs. Chest tube dc'd. Meropenem/ bactrim. Last CXR infiltrates resolved 2. ESRD - MWF HD 3. Anemia - Hgb 8.9. On ESA. Follow Hgb.  4. Secondary hyperparathyroidism - Ca  8.0 C Ca 9.2 Stopped daily calcitriol. Resumed hectoral with HD. Phos 11.4. Start renvela 1600 mg PO TID with meals.  5. Volume - euvolemic, 7kg under prior dry wt 6. Nutrition - Albumin 2.5. Dysphagia I diet. Add prostat 7. Afib - on MTP 50 bid + coumadin 8. HTN - on MTP only for afib 9. Dispo - needs to get OOB to chair for 3-4 hours before can be dc'd to SNF.    Plan - HD today. Needs to get OOB to chair. Is on PT list but not getting PT due to being out of room, pt not wanting to work, etc.     Kelly Splinter MD [facility] pager [phone]    cell [phone] 06/21/2015, 9:07 AM    Subjective: feeling good, no cough or SOB.   Objective Filed Vitals:   06/21/15 0732 06/21/15 0741 06/21/15 0825 06/21/15 0833  BP: 148/54  137/62 122/66  Pulse: 104  104 102  Temp: 97.1 F (36.2 C)  98 F (36.7 C)   TempSrc:   Axillary   Resp: 23  26   Height:      Weight:   81.1 kg (178 lb 12.7 oz)   SpO2: 90% 93% 94%    Physical Exam General: chronically ill appearing male in NAD Heart: S1, S2. No R/G/M Lungs: some basilar crackels, mostlyclear Abdomen: active BS. Nontender Extremities: No LE edema.  Dialysis Access: Maturing RUA AVF + bruit. R perm cath Drsg CDI.   Dialysis Orders: [facility] MWF  4h 2/2.25 bath 86kg Hep 8600 R IJ cath / RUE AVF(maturing) Venofer 100/ hd x 10  Hect 1 ug   Additional Objective Labs: Basic Metabolic Panel:  Recent Labs Lab 06/16/15 0303 06/17/15 0510 06/18/15 0503 06/19/15 0926 06/20/15 0910  NA 139 139 137 135 139  K 3.6 4.3 4.4 5.1 4.6  CL 96* 98* 95* 95* 98*  CO2 28 23 25 23 30   GLUCOSE 135* 88 98  108* 96  BUN 74* 143* 73* 120* 50*  CREATININE 3.87* 6.20* 4.15* 5.96* 4.08*  CALCIUM 8.3* 8.2* 8.1* 8.0* 7.9*  PHOS 6.2* 9.9*  --  11.4*  --    Liver Function Tests:  Recent Labs Lab 06/18/15 0503 06/19/15 0926 06/20/15 0910  AST 27  --  25  ALT 23  --  24  ALKPHOS 56  --  55  BILITOT 0.9  --  0.9  PROT 5.7*  --  5.1*  ALBUMIN 2.8* 2.5* 2.3*   No results for input(s): LIPASE, AMYLASE in the last 168 hours. CBC:  Recent Labs Lab 06/17/15 0510 06/18/15 0503 06/19/15 0340 06/19/15 0926 06/20/15 0329  WBC 16.7* 15.7* 19.3* 19.3* 18.0*  NEUTROABS  --   --   --  16.9*  --   HGB 8.6* 8.8* 9.4* 8.9* 8.7*  HCT 28.2* 28.1* 30.3* 28.3* 28.8*  MCV 98.6 99.6 98.4 97.3 101.1*  PLT 259 237 169 250 220   Blood Culture    Component Value Date/Time   SDES BRONCHIAL ALVEOLAR LAVAGE 06/12/2015 1503   SPECREQUEST NONE 06/12/2015 1503   CULT  06/12/2015 1503    ABUNDANT ENTEROBACTER CLOACAE Note: TIGECYCLINE  R >=  8 COLISTIN  .25ug/ml ETEST results for this drug are "FOR INVESTIGATIONAL USE ONLY" and should NOT be used for clinical purposes. CRE CRITICAL RESULT CALLED TO, READ BACK BY AND VERIFIED WITH: J BISHOP RN 11AM 06/16/15 GUSTK    REPTSTATUS 06/18/2015 FINAL 06/12/2015 1503    Cardiac Enzymes: No results for input(s): CKTOTAL, CKMB, CKMBINDEX, TROPONINI in the last 168 hours. CBG:  Recent Labs Lab 06/17/15 0018 06/17/15 0415 06/17/15 0733 06/17/15 1400 06/18/15 2009  GLUCAP 93 75 84 107* 91   Iron Studies: No results for input(s): IRON, TIBC, TRANSFERRIN, FERRITIN in the last 72 hours. @lablastinr3 @ Studies/Results: Dg Chest Port 1 View  06/19/2015  CLINICAL DATA:  Chest tube removal. EXAM: PORTABLE CHEST 1 VIEW 3:33 p.m. COMPARISON:  Chest x-ray dated 06/19/2015 at 4:43 a.m. FINDINGS: Left chest tube has been removed. No recurrence of pneumothorax. Slight atelectasis at the left lung base, unchanged. Improved infiltrate at the right lung base. Slight increased  infiltrate in the right midzone. Double lumen central venous catheter in place, unchanged. IMPRESSION: 1. No pneumothorax after left chest tube removal. Persistent slight left base atelectasis. 2. Improved infiltrate at the right base. 3. Slightly increased hazy infiltrate in the right midzone. Electronically Signed   By: Lorriane Shire M.D.   On: 06/19/2015 15:45   Medications:   . antiseptic oral rinse  7 mL Mouth Rinse BID  . arformoterol  15 mcg Nebulization BID  . atorvastatin  20 mg Oral q1800  . budesonide (PULMICORT) nebulizer solution  0.5 mg Nebulization BID  . chlorhexidine gluconate  15 mL Mouth Rinse BID  . colistimethate  150 mg Inhalation BID  . darbepoetin (ARANESP) injection - DIALYSIS  200 mcg Intravenous Q Mon-HD  . docusate sodium  100 mg Oral BID  . doxercalciferol  2 mcg Intravenous Q M,W,F-HD  . feeding supplement (PRO-STAT SUGAR FREE 64)  30 mL Oral BID  . ipratropium-albuterol  3 mL Nebulization TID  . meropenem (MERREM) IV  1 g Intravenous Q24H  . metoprolol tartrate  50 mg Oral BID  . multivitamin  1 tablet Oral QHS  . predniSONE  10 mg Oral Q breakfast  . sevelamer carbonate  1,600 mg Oral TID WC  . sodium chloride  3 mL Intravenous Q12H  . sulfamethoxazole-trimethoprim  400 mg Intravenous Q24H  . Warfarin - Pharmacist Dosing Inpatient   Does not apply [phone]

## 2015-06-21 NOTE — Progress Notes (Signed)
CSW spoke with Abigail Butts- Admissions at [facility] earlier today. She has initiated insurance auth for patient to come to facility when medically stable. This is facility of choice per patient/family.  Lorie Phenix. Pauline Good, [place]

## 2015-06-21 NOTE — Progress Notes (Signed)
[place] TEAM 1 - Stepdown/ICU TEAM Progress Note  Terry [name] [id number] DOB: 1934/11/17 DOA: 06/07/2015 PCP: [name], DO  Admit HPI / Brief Narrative: 80 y.o. WM PMHx ESRD on HD just started on HD 2 wks, A-Fib on anticoagulation,Chronic Diastolic CHF,CAD native artery, COPD, Chronic Respiratory Failure Discharged on December 13,   Admitted for acute on chronic renal failure. He was deemed to be end-stage renal disease and was started on dialysis. After discharge, patient was feeling well, but he did have a cough. The cough got worse over the last 2 days. He has been coughing up yellowish expectoration. Denies any fever per se. His breathing has become more labored. He went for his dialysis on Wednesday, which he tolerated well. And then he was going for his dialysis today when he felt more short of breath. During dialysis because patient became acutely short of breath and was noted to have low oxygen saturations. Patient was subsequently transported over to the emergency department. He complains of some right-sided chest pain. No nausea, vomiting. No difficulty with swallowing or choking spell with swallowing. No dizziness or lightheadedness. After getting breathing treatments in the emergency department he is feeling better. He was found to have a temperature of 31F, he was tachycardic, his sats were in the 70s when he presented to the hospital. Chest x-ray revealed bibasilar opacities. He has slightly elevated WBC. Presentation is highly suggestive of pneumonia. Hospitalist was called for admission. Patient still requiring 100% nonrebreather, although it could be titrated down   HPI/Subjective: 12/30  A/O 4, sitting in chair comfortably on Ventimask. Will quickly drop SPO2 to 80% off of mask and when answering questions.Negative CP  Assessment/Plan: Acute hypoxemic respiratory failure -slowly improving  - extubated 12/25 patient still requiring Ventimask. Consulted with RT Merry Proud  will try to obtain high flow nasal cannula equipment to determine if patient can stay off of Ventimask greater length of time. - Very drug-resistant Enterobacter cloacae HCAP -ID and Pharm directing abx selection/delivery   Lt  PTX -12/28 Left Chest Tube per PCCM;CXR stable   COPD w/ acute exacerbation  -Acute exacerbation appears to have resolved - -Continue prednisone 10 mg daily  -PFTs suggest very severe COPD, should he decompensate again from pulm status, re-intubation is highly likely to be required  -Patient continues to have difficulty weaning; is there a cardiac component? Obtain echocardiogram  HTN  -BP currently controlled   CAD s/p CABG -Asymptomatic at this time  Chronic AFib on coumadin  -Rate controlled -Restart Coumadin at low dose, and DC if any hemoptysis recurs   Chronic grade 1 Diastolic heart failure -No evidence of severe volume overload at this time - volume management per HD  -Echocardiogram pending  ESRD on HD M/W/F  -HD per Nephrology   Anemia of chronic disease -Hgb holding steady   Hx of CVA  Code Status: FULL Family Communication: None  Disposition Plan: Per ID    Consultants: Dr.Robert Schertz Nephrology Dr.Robert Ethel Rana ID Dr.Douglas B McQuaid PCCM  Procedure/Significant Events: 12/16 intubated, L tension ptx post intubation, chest tube placed in ER  extubated   Culture Urine 12/16>>> no UOP BC x 2 12/16>>> Sputum 12/16>>>klieb, int ceftriaxone   Antibiotics: vanc 12/16>>>12/19 Cefepime 12/16>>>12/19 ceftaz 12/19>>>stop 12/23 Meropenem 12/28>> Bactrim 12/28>>   DVT prophylaxis: Coumadin   Devices   LINES / TUBES:  ETT 12/16>>>12/18 R chest perm cath >>> L chest tube 12/16>>>     Continuous Infusions:   Objective: VITAL SIGNS:  Temp: 97.5 F (36.4 C) (12/30 0419) Temp Source: Oral (12/30 0419) BP: 140/62 mmHg (12/30 0419) Pulse Rate: 88 (12/30 0419) SPO2; FIO2:  No intake or output  data in the 24 hours ending 06/21/15 0722   Exam: General: A/O 4, positive acute respiratory distress Eyes: Negative headache, eye pain, double vision,negative scleral hemorrhage ENT: Negative Runny nose, negative ear pain, negative gingival bleeding, Neck:  Negative scars, masses, torticollis, lymphadenopathy, JVD Lungs: tachypnea, positive diffuse rhonchi with mild expiratory wheezes, negative crackles Cardiovascular: Irregular irregular rhythm and rate without murmur gallop or rub normal S1 and S2 Abdomen:negative abdominal pain, nondistended, positive soft, bowel sounds, no rebound, no ascites, no appreciable mass Extremities: No significant cyanosis, clubbing, or edema bilateral lower extremities Psychiatric:  Negative depression, negative anxiety, negative fatigue, negative mania  Neurologic:  Cranial nerves II through XII intact, tongue/uvula midline, all extremities muscle strength 5/5, sensation intact throughout, negative dysarthria, negative expressive aphasia, negative receptive aphasia.   Data Reviewed: Basic Metabolic Panel:  Recent Labs Lab 06/16/15 0303 06/17/15 0510 06/18/15 0503 06/19/15 0926 06/20/15 0910  NA 139 139 137 135 139  K 3.6 4.3 4.4 5.1 4.6  CL 96* 98* 95* 95* 98*  CO2 28 23 25 23 30   GLUCOSE 135* 88 98 108* 96  BUN 74* 143* 73* 120* 50*  CREATININE 3.87* 6.20* 4.15* 5.96* 4.08*  CALCIUM 8.3* 8.2* 8.1* 8.0* 7.9*  MG 2.2 2.6*  --   --  2.1  PHOS 6.2* 9.9*  --  11.4*  --    Liver Function Tests:  Recent Labs Lab 06/18/15 0503 06/19/15 0926 06/20/15 0910  AST 27  --  25  ALT 23  --  24  ALKPHOS 56  --  55  BILITOT 0.9  --  0.9  PROT 5.7*  --  5.1*  ALBUMIN 2.8* 2.5* 2.3*   No results for input(s): LIPASE, AMYLASE in the last 168 hours. No results for input(s): AMMONIA in the last 168 hours. CBC:  Recent Labs Lab 06/17/15 0510 06/18/15 0503 06/19/15 0340 06/19/15 0926 06/20/15 0329  WBC 16.7* 15.7* 19.3* 19.3* 18.0*  NEUTROABS   --   --   --  16.9*  --   HGB 8.6* 8.8* 9.4* 8.9* 8.7*  HCT 28.2* 28.1* 30.3* 28.3* 28.8*  MCV 98.6 99.6 98.4 97.3 101.1*  PLT 259 237 169 250 220   Cardiac Enzymes: No results for input(s): CKTOTAL, CKMB, CKMBINDEX, TROPONINI in the last 168 hours. BNP (last 3 results)  Recent Labs  05/13/15 2017 06/08/15 0016  BNP 362.1* 892.3*    ProBNP (last 3 results) No results for input(s): PROBNP in the last 8760 hours.  CBG:  Recent Labs Lab 06/17/15 0018 06/17/15 0415 06/17/15 0733 06/17/15 1400 06/18/15 2009  GLUCAP 93 75 84 107* 91    Recent Results (from the past 240 hour(s))  Culture, respiratory (NON-Expectorated)     Status: None   Collection Time: 06/12/15  3:03 PM  Result Value Ref Range Status   Specimen Description BRONCHIAL ALVEOLAR LAVAGE  Final   Special Requests NONE  Final   Gram Stain   Final    ABUNDANT WBC PRESENT, PREDOMINANTLY PMN NO SQUAMOUS EPITHELIAL CELLS SEEN NO ORGANISMS SEEN    Culture   Final    ABUNDANT ENTEROBACTER CLOACAE Note: TIGECYCLINE  R >=8 COLISTIN  .25ug/ml ETEST results for this drug are "FOR INVESTIGATIONAL USE ONLY" and should NOT be used for clinical purposes. CRE CRITICAL RESULT CALLED TO, READ BACK BY AND  VERIFIED WITH: J BISHOP RN 11AM 06/16/15 GUSTK    Report Status 06/18/2015 FINAL  Final   Organism ID, Bacteria ENTEROBACTER CLOACAE  Final      Susceptibility   Enterobacter cloacae - MIC*    CEFAZOLIN Value in next row Resistant      >=64 RESISTANTPerformed at [facility]    CEFEPIME Value in next row Resistant      >=64 RESISTANTPerformed at [facility]    CEFTAZIDIME Value in next row Resistant      >=64 RESISTANTPerformed at [facility]    CEFTRIAXONE Value in next row Resistant      >=64 RESISTANTPerformed at [facility]    CIPROFLOXACIN Value in next row Sensitive      <=0.25 SENSITIVEPerformed at [place] Value in next row Sensitive      <=1  SENSITIVEPerformed at [facility]    IMIPENEM Value in next row       RESISTANTPerformed at [facility]    PIP/TAZO Value in next row Resistant      >=128 RESISTANTPerformed at [facility]    TOBRAMYCIN Value in next row Sensitive      <=1 SENSITIVEPerformed at [facility]    TRIMETH/SULFA Value in next row Sensitive      <=20 SENSITIVE(NOTE)    AMIKACIN Value in next row Sensitive      <=20 SENSITIVE(NOTE)    AZTREONAM Value in next row Resistant      >=64 RESISTANTPerformed at [facility]    * ABUNDANT ENTEROBACTER CLOACAE     Studies:  Recent x-ray studies have been reviewed in detail by the Attending Physician  Scheduled Meds:  Scheduled Meds: . antiseptic oral rinse  7 mL Mouth Rinse BID  . arformoterol  15 mcg Nebulization BID  . atorvastatin  20 mg Oral q1800  . budesonide (PULMICORT) nebulizer solution  0.5 mg Nebulization BID  . chlorhexidine gluconate  15 mL Mouth Rinse BID  . colistimethate  150 mg Inhalation BID  . darbepoetin (ARANESP) injection - DIALYSIS  200 mcg Intravenous Q Mon-HD  . docusate sodium  100 mg Oral BID  . doxercalciferol  2 mcg Intravenous Q M,W,F-HD  . feeding supplement (PRO-STAT SUGAR FREE 64)  30 mL Oral BID  . ipratropium-albuterol  3 mL Nebulization TID  . meropenem (MERREM) IV  1 g Intravenous Q24H  . metoprolol tartrate  50 mg Oral BID  . multivitamin  1 tablet Oral QHS  . predniSONE  10 mg Oral Q breakfast  . sevelamer carbonate  1,600 mg Oral TID WC  . sodium chloride  3 mL Intravenous Q12H  . sulfamethoxazole-trimethoprim  400 mg Intravenous Q24H  . Warfarin - Pharmacist Dosing Inpatient   Does not apply q1800    Time spent on care of this patient: 40 mins   WOODS, Geraldo Docker , MD  [facility] Office  [phone] Pager - [phone]  On-Call/Text Page:      [email or web address]      password [email or web address]  If 7PM-7AM, please contact night-coverage [email or web address] Password  [email or web address] 06/21/2015, 7:22 AM   LOS: 14 days   Care during the described time interval was provided by me .  I have reviewed this patient's available data, including medical history, events of note, physical examination, and all test results as part of my evaluation. I have personally reviewed and interpreted all radiology studies.   Dia Crawford, MD [phone] Pager

## 2015-06-22 ENCOUNTER — Inpatient Hospital Stay (HOSPITAL_COMMUNITY): Payer: Medicare PPO

## 2015-06-22 DIAGNOSIS — J9601 Acute respiratory failure with hypoxia: Secondary | ICD-10-CM

## 2015-06-22 DIAGNOSIS — N186 End stage renal disease: Secondary | ICD-10-CM | POA: Diagnosis not present

## 2015-06-22 DIAGNOSIS — E1122 Type 2 diabetes mellitus with diabetic chronic kidney disease: Secondary | ICD-10-CM | POA: Diagnosis not present

## 2015-06-22 DIAGNOSIS — Z992 Dependence on renal dialysis: Secondary | ICD-10-CM | POA: Diagnosis not present

## 2015-06-22 LAB — COMPREHENSIVE METABOLIC PANEL
ALT: 26 U/L (ref 17–63)
ANION GAP: 8 (ref 5–15)
AST: 28 U/L (ref 15–41)
Albumin: 2.2 g/dL — ABNORMAL LOW (ref 3.5–5.0)
Alkaline Phosphatase: 54 U/L (ref 38–126)
BILIRUBIN TOTAL: 0.6 mg/dL (ref 0.3–1.2)
BUN: 27 mg/dL — ABNORMAL HIGH (ref 6–20)
CHLORIDE: 97 mmol/L — AB (ref 101–111)
CO2: 31 mmol/L (ref 22–32)
Calcium: 7.9 mg/dL — ABNORMAL LOW (ref 8.9–10.3)
Creatinine, Ser: 3.67 mg/dL — ABNORMAL HIGH (ref 0.61–1.24)
GFR, EST AFRICAN AMERICAN: 17 mL/min — AB (ref >60)
GFR, EST NON AFRICAN AMERICAN: 15 mL/min — AB (ref >60)
Glucose, Bld: 102 mg/dL — ABNORMAL HIGH (ref 65–99)
POTASSIUM: 5.4 mmol/L — AB (ref 3.5–5.1)
SODIUM: 136 mmol/L (ref 135–145)
TOTAL PROTEIN: 4.8 g/dL — AB (ref 6.5–8.1)

## 2015-06-22 LAB — CBC WITH DIFFERENTIAL/PLATELET
BASOS ABS: 0 10*3/uL (ref 0.0–0.1)
BASOS PCT: 0 %
EOS ABS: 0.3 10*3/uL (ref 0.0–0.7)
Eosinophils Relative: 2 %
HCT: 27.3 % — ABNORMAL LOW (ref 39.0–52.0)
HEMOGLOBIN: 8.3 g/dL — AB (ref 13.0–17.0)
LYMPHS PCT: 10 %
Lymphs Abs: 1.3 10*3/uL (ref 0.7–4.0)
MCH: 31.2 pg (ref 26.0–34.0)
MCHC: 30.4 g/dL (ref 30.0–36.0)
MCV: 102.6 fL — ABNORMAL HIGH (ref 78.0–100.0)
Monocytes Absolute: 0.4 10*3/uL (ref 0.1–1.0)
Monocytes Relative: 3 %
NEUTROS PCT: 85 %
Neutro Abs: 11.3 10*3/uL — ABNORMAL HIGH (ref 1.7–7.7)
Platelets: 213 10*3/uL (ref 150–400)
RBC: 2.66 MIL/uL — ABNORMAL LOW (ref 4.22–5.81)
RDW: 21.4 % — ABNORMAL HIGH (ref 11.5–15.5)
WBC: 13.3 10*3/uL — ABNORMAL HIGH (ref 4.0–10.5)

## 2015-06-22 LAB — PROTIME-INR
INR: 1.09 (ref 0.00–1.49)
PROTHROMBIN TIME: 14.3 s (ref 11.6–15.2)

## 2015-06-22 LAB — MAGNESIUM: MAGNESIUM: 2 mg/dL (ref 1.7–2.4)

## 2015-06-22 MED ORDER — WARFARIN SODIUM 2 MG PO TABS
2.0000 mg | ORAL_TABLET | Freq: Once | ORAL | Status: AC
  Administered 2015-06-22: 2 mg via ORAL
  Filled 2015-06-22: qty 1

## 2015-06-22 MED ORDER — ONDANSETRON HCL 4 MG/2ML IJ SOLN
4.0000 mg | Freq: Four times a day (QID) | INTRAMUSCULAR | Status: DC | PRN
  Administered 2015-06-22 – 2015-06-26 (×2): 4 mg via INTRAVENOUS
  Filled 2015-06-22 (×2): qty 2

## 2015-06-22 NOTE — Progress Notes (Signed)
ANTICOAGULATION CONSULT NOTE - FOLLOW UP  Pharmacy Consult:  Coumadin Indication: atrial fibrillation  Allergies  Allergen Reactions  . Yellow Dyes (Non-Tartrazine) Other (See Comments)    Burns arms Cough medications (tussionex and benzonatate ok)  . Levaquin [Levofloxacin In D5w] Itching    Patient Measurements: Height: 5\' 7"  (170.2 cm) Weight: 179 lb 10.8 oz (81.5 kg) IBW/kg (Calculated) : 66.1  Vital Signs: Temp: 97.9 F (36.6 C) (12/31 0308) Temp Source: Oral (12/31 0308) BP: 131/54 mmHg (12/31 0308) Pulse Rate: 86 (12/31 0308)  Labs:  Recent Labs  06/19/15 0926 06/20/15 0329 06/20/15 0910 06/21/15 0311 06/22/15 0352  HGB 8.9* 8.7*  --   --   --   HCT 28.3* 28.8*  --   --   --   PLT 250 220  --   --   --   LABPROT  --   --   --  12.9 14.3  INR  --   --   --  0.95 1.09  CREATININE 5.96*  --  4.08*  --   --     Estimated Creatinine Clearance: 15.3 mL/min (by C-G formula based on Cr of 4.08).     Assessment: 48 YOM with history of Afib continued on Coumadin from PTA upon admit.  Anticoagulation held on 06/12/15 after finding of bleeding into lungs during bronch.  Chest tube removed on 06/19/15 and Coumadin resumed 06/20/15.  Patient is coughing up old blood sometimes but okay to continue Coumadin per MD.  His INR is sub-therapeutic.  Noted he is currently on Septra, which could increase patient's INR.  Home Coumadin dose:  4mg  PO daily   Goal of Therapy:  INR 2-3  Resolution of infection    Plan:  - Coumadin 2mg  PO today.  Dose conservatively to evaluate Septra's effect on Coumadin. - Daily PT / INR - Monitor closely for s/sx of bleeding - Continue Septra 400mg  IV Q24H - Continue Merrem 1gm IV Q24H - Continue Inhaled colistin 150mg  IH BID    Maybell Misenheimer D. Mina Marble, PharmD, BCPS Pager:  [phone] 06/22/2015, 7:46 AM

## 2015-06-22 NOTE — Progress Notes (Signed)
Turn O2 down to 9L.  Sats have been 97 % on 10

## 2015-06-22 NOTE — Progress Notes (Signed)
[facility] for Infectious Disease   Reason for visit: Follow up on pneumonia  Interval History: he continues on bactrim, meropenem, inhaled colistin.  No fever.  Still needs venti mask some.   Physical Exam: Constitutional:  Filed Vitals:   06/22/15 0954 06/22/15 1109  BP: 133/59 116/48  Pulse: 101 99  Temp: 97.8 F (36.6 C)   Resp: 21    patient appears in NAD Respiratory: Normal respiratory effort; no rhonchi noted Cardiovascular: RRR GI: soft, nt  Review of Systems: Respiratory: negative for dyspnea on exertion or stridor Gastrointestinal: negative for diarrhea  Lab Results  Component Value Date   WBC 13.3* 06/22/2015   HGB 8.3* 06/22/2015   HCT 27.3* 06/22/2015   MCV 102.6* 06/22/2015   PLT 213 06/22/2015    Lab Results  Component Value Date   CREATININE 3.67* 06/22/2015   BUN 27* 06/22/2015   NA 136 06/22/2015   K 5.4* 06/22/2015   CL 97* 06/22/2015   CO2 31 06/22/2015    Lab Results  Component Value Date   ALT 26 06/22/2015   AST 28 06/22/2015   ALKPHOS 54 06/22/2015     Microbiology: Recent Results (from the past 240 hour(s))  Culture, respiratory (NON-Expectorated)     Status: None   Collection Time: 06/12/15  3:03 PM  Result Value Ref Range Status   Specimen Description BRONCHIAL ALVEOLAR LAVAGE  Final   Special Requests NONE  Final   Gram Stain   Final    ABUNDANT WBC PRESENT, PREDOMINANTLY PMN NO SQUAMOUS EPITHELIAL CELLS SEEN NO ORGANISMS SEEN    Culture   Final    ABUNDANT ENTEROBACTER CLOACAE Note: TIGECYCLINE  R >=8 COLISTIN  .25ug/ml ETEST results for this drug are "FOR INVESTIGATIONAL USE ONLY" and should NOT be used for clinical purposes. CRE CRITICAL RESULT CALLED TO, READ BACK BY AND VERIFIED WITH: J BISHOP RN 11AM 06/16/15 GUSTK    Report Status 06/18/2015 FINAL  Final   Organism ID, Bacteria ENTEROBACTER CLOACAE  Final      Susceptibility   Enterobacter cloacae - MIC*    CEFAZOLIN Value in next row Resistant    >=64 RESISTANTPerformed at [facility]    CEFEPIME Value in next row Resistant      >=64 RESISTANTPerformed at [facility]    CEFTAZIDIME Value in next row Resistant      >=64 RESISTANTPerformed at [facility]    CEFTRIAXONE Value in next row Resistant      >=64 RESISTANTPerformed at [facility]    CIPROFLOXACIN Value in next row Sensitive      <=0.25 SENSITIVEPerformed at [place] Value in next row Sensitive      <=1 SENSITIVEPerformed at [facility]    IMIPENEM Value in next row       RESISTANTPerformed at [facility]    PIP/TAZO Value in next row Resistant      >=128 RESISTANTPerformed at [facility]    TOBRAMYCIN Value in next row Sensitive      <=1 SENSITIVEPerformed at [facility]    TRIMETH/SULFA Value in next row Sensitive      <=20 SENSITIVE(NOTE)    AMIKACIN Value in next row Sensitive      <=20 SENSITIVE(NOTE)    AZTREONAM Value in next row Resistant      >=64 RESISTANTPerformed at [facility]    * ABUNDANT ENTEROBACTER CLOACAE    Impression/Plan:  1.  Pneumonia - heavy growth of Enterobacter, with multidrug resistance, on therapy with meropenem, Bactrim and inhaled colistin.  WBC continues to improve and pt clinically seems to be responding -continue current antibiotics for 7 more days through 1/6 and stop.    I will sign off, please call for any new issues or changes. thanks

## 2015-06-22 NOTE — Progress Notes (Signed)
[facility] Progress Note  Assessment:  1. HCAP w MDR enterobacter/ resp failure/ COPD: extubated 12/25. Pred/ abx/ nebs. Chest tube dc'd. Meropenem/ bactrim. Last CXR showed infiltrates resolved 2. ESRD - MWF HD 3. Anemia - Hgb 8.9. On ESA. Follow Hgb.  4. Secondary hyperparathyroidism - Ca  8.0 C Ca 9.2 Stopped daily calcitriol. Resumed hectoral with HD. Phos 11.4. Started renvela 1600 mg PO TID with meals.  5. Volume - euvolemic, 6kg under prior dry wt 6. Nutrition - Albumin 2.5. Dysphagia I diet. Add prostat 7. Afib - on MTP 50 bid + coumadin 8. HTN - on MTP only for afib 9. Dispo - needs to get OOB to chair for 3-4 hours before can be dc'd to SNF.    Plan - Needs to mobilize, explained to patient. Next HD Monday in the chair.     Kelly Splinter MD [facility] pager [phone]    cell [phone] 06/22/2015, 11:16 AM    Subjective: feeling good, no cough or SOB.   Objective Filed Vitals:   06/22/15 0500 06/22/15 0918 06/22/15 0954 06/22/15 1109  BP:   133/59 116/48  Pulse:   101 99  Temp:   97.8 F (36.6 C)   TempSrc:   Oral   Resp:   21   Height:      Weight: 81.5 kg (179 lb 10.8 oz)     SpO2:  99% 100%    Physical Exam General: chronically ill appearing male in NAD Heart: S1, S2. No R/G/M Lungs: some basilar crackels, mostlyclear Abdomen: active BS. Nontender Extremities: No LE edema.  Dialysis Access: Maturing RUA AVF + bruit. R perm cath Drsg CDI.   Dialysis Orders: [facility] MWF  4h 2/2.25 bath 86kg Hep 8600 R IJ cath / RUE AVF(maturing) Venofer 100/ hd x 10  Hect 1 ug   Additional Objective Labs: Basic Metabolic Panel:  Recent Labs Lab 06/16/15 0303 06/17/15 0510  06/19/15 0926 06/20/15 0910 06/22/15 0715  NA 139 139  < > 135 139 136  K 3.6 4.3  < > 5.1 4.6 5.4*  CL 96* 98*  < > 95* 98* 97*  CO2 28 23  < > 23 30 31   GLUCOSE 135* 88  < > 108* 96 102*  BUN 74* 143*  < > 120* 50* 27*  CREATININE 3.87* 6.20*  <  > 5.96* 4.08* 3.67*  CALCIUM 8.3* 8.2*  < > 8.0* 7.9* 7.9*  PHOS 6.2* 9.9*  --  11.4*  --   --   < > = values in this interval not displayed. Liver Function Tests:  Recent Labs Lab 06/18/15 0503 06/19/15 0926 06/20/15 0910 06/22/15 0715  AST 27  --  25 28  ALT 23  --  24 26  ALKPHOS 56  --  55 54  BILITOT 0.9  --  0.9 0.6  PROT 5.7*  --  5.1* 4.8*  ALBUMIN 2.8* 2.5* 2.3* 2.2*   No results for input(s): LIPASE, AMYLASE in the last 168 hours. CBC:  Recent Labs Lab 06/18/15 0503 06/19/15 0340 06/19/15 0926 06/20/15 0329 06/22/15 0715  WBC 15.7* 19.3* 19.3* 18.0* 13.3*  NEUTROABS  --   --  16.9*  --  11.3*  HGB 8.8* 9.4* 8.9* 8.7* 8.3*  HCT 28.1* 30.3* 28.3* 28.8* 27.3*  MCV 99.6 98.4 97.3 101.1* 102.6*  PLT 237 169 250 220 213   Blood Culture    Component Value Date/Time   SDES BRONCHIAL ALVEOLAR LAVAGE 06/12/2015 1503  SPECREQUEST NONE 06/12/2015 1503   CULT  06/12/2015 1503    ABUNDANT ENTEROBACTER CLOACAE Note: TIGECYCLINE  R >=8 COLISTIN  .25ug/ml ETEST results for this drug are "FOR INVESTIGATIONAL USE ONLY" and should NOT be used for clinical purposes. CRE CRITICAL RESULT CALLED TO, READ BACK BY AND VERIFIED WITH: J BISHOP RN 11AM 06/16/15 GUSTK    REPTSTATUS 06/18/2015 FINAL 06/12/2015 1503    Cardiac Enzymes: No results for input(s): CKTOTAL, CKMB, CKMBINDEX, TROPONINI in the last 168 hours. CBG:  Recent Labs Lab 06/17/15 0018 06/17/15 0415 06/17/15 0733 06/17/15 1400 06/18/15 2009  GLUCAP 93 75 84 107* 91   Iron Studies: No results for input(s): IRON, TIBC, TRANSFERRIN, FERRITIN in the last 72 hours. @lablastinr3 @ Studies/Results: No results found. Medications:   . antiseptic oral rinse  7 mL Mouth Rinse BID  . arformoterol  15 mcg Nebulization BID  . atorvastatin  20 mg Oral q1800  . budesonide (PULMICORT) nebulizer solution  0.5 mg Nebulization BID  . chlorhexidine gluconate  15 mL Mouth Rinse BID  . colistimethate  150 mg Inhalation  BID  . darbepoetin (ARANESP) injection - DIALYSIS  200 mcg Intravenous Q Mon-HD  . docusate sodium  100 mg Oral BID  . doxercalciferol  2 mcg Intravenous Q M,W,F-HD  . feeding supplement (PRO-STAT SUGAR FREE 64)  30 mL Oral BID  . ipratropium-albuterol  3 mL Nebulization TID  . meropenem (MERREM) IV  1 g Intravenous Q24H  . metoprolol tartrate  50 mg Oral BID  . multivitamin  1 tablet Oral QHS  . predniSONE  10 mg Oral Q breakfast  . sevelamer carbonate  1,600 mg Oral TID WC  . sodium chloride  3 mL Intravenous Q12H  . sulfamethoxazole-trimethoprim  400 mg Intravenous Q24H  . warfarin  2 mg Oral ONCE-1800  . Warfarin - Pharmacist Dosing Inpatient   Does not apply [phone]

## 2015-06-22 NOTE — Progress Notes (Signed)
  Echocardiogram 2D Echocardiogram has been performed.  Terry [name] 06/22/2015, 2:11 PM

## 2015-06-22 NOTE — Progress Notes (Addendum)
[place] TEAM 1 - Stepdown/ICU TEAM Progress Note  Terry SHILLINGTON [id number] DOB: 20-Sep-1934 DOA: 06/07/2015 PCP: [name], DO  Admit HPI / Brief Narrative: 80 y.o. WM PMHx ESRD on HD just started on HD 2 wks, A-Fib on anticoagulation,Chronic Diastolic CHF,CAD native artery, COPD, Chronic Respiratory Failure Discharged on December 13,   Admitted for acute on chronic renal failure. He was deemed to be end-stage renal disease and was started on dialysis. After discharge, patient was feeling well, but he did have a cough. The cough got worse over the last 2 days. He has been coughing up yellowish expectoration. Denies any fever per se. His breathing has become more labored. He went for his dialysis on Wednesday, which he tolerated well. And then he was going for his dialysis today when he felt more short of breath. During dialysis because patient became acutely short of breath and was noted to have low oxygen saturations. Patient was subsequently transported over to the emergency department. He complains of some right-sided chest pain. No nausea, vomiting. No difficulty with swallowing or choking spell with swallowing. No dizziness or lightheadedness. After getting breathing treatments in the emergency department he is feeling better. He was found to have a temperature of 29F, he was tachycardic, his sats were in the 70s when he presented to the hospital. Chest x-ray revealed bibasilar opacities. He has slightly elevated WBC. Presentation is highly suggestive of pneumonia. Hospitalist was called for admission. Patient still requiring 100% nonrebreather, although it could be titrated down   HPI/Subjective: 12/31  sleepy, however will wake and asked her questions appropriately laying in bed on High Fllow nasal cannula. Negative CP. Patient unsure whether he has had hemoptysis overnight.  Assessment/Plan: Acute hypoxemic respiratory failure -slowly improving  - extubated 12/25 patient still  requiring Ventimask. Consulted with RT Merry Proud will try to obtain high flow nasal cannula equipment to determine if patient can stay off of Ventimask greater length of time. - Very drug-resistant Enterobacter cloacae HCAP -ID and Pharm directing abx selection/delivery   Lt  PTX -12/28 Left Chest Tube per PCCM;CXR stable   COPD w/ acute exacerbation  -Acute exacerbation appears to have resolved - -Continue prednisone 10 mg daily  -PFTs suggest very severe COPD, should he decompensate again from pulm status, re-intubation is highly likely to be required  -Patient continues to have difficulty weaning; is there a cardiac component? Obtain echocardiogram  HTN  -BP currently controlled   CAD s/p CABG -Asymptomatic at this time -Transfuse for hemoglobin<8  Chronic AFib on coumadin  -Rate controlled -Restart Coumadin at low dose, and DC if any hemoptysis recurs   Chronic grade 1 Diastolic heart failure -No evidence of severe volume overload at this time - volume management per HD  -Echocardiogram pending  ESRD on HD M/W/F  -HD per Nephrology   Anemia of chronic disease -Hgb trending down  -Patient's suction tubing clear of blood however canister has certainly gone his fluid. Old canister? Will change canister out and monitor for any bleeding. May have to stop Coumadin again.  Hx of CVA  Code Status: FULL Family Communication: None  Disposition Plan: Per ID    Consultants: Dr.Robert Schertz Nephrology Dr.Robert W Comer ID Dr.Douglas B McQuaid PCCM  Procedure/Significant Events: 12/16 intubated, L tension ptx post intubation, chest tube placed in ER  extubated  12/31 echocardiogram;- Left ventricle: moderateconcentric hypertrophy. LVEF = 65% to 70%.  - Pulmonary arteries: PApeak pressure: 39 mm Hg (S).   Culture Urine 12/16>>>  no UOP BC x 2 12/16>>> Sputum 12/16>>>klieb, int ceftriaxone   Antibiotics: vanc 12/16>>>12/19 Cefepime 12/16>>>12/19 ceftaz  12/19>>>stop 12/23 Meropenem 12/28>> Bactrim 12/28>>   DVT prophylaxis: Coumadin   Devices   LINES / TUBES:  ETT 12/16>>>12/18 R chest perm cath >>> L chest tube 12/16>>>     Continuous Infusions:   Objective: VITAL SIGNS: Temp: 97.9 F (36.6 C) (12/31 0308) Temp Source: Oral (12/31 0308) BP: 131/54 mmHg (12/31 0308) Pulse Rate: 86 (12/31 0308) SPO2; FIO2:   Intake/Output Summary (Last 24 hours) at 06/22/15 [id number] Last data filed at 06/21/15 2122  Gross per 24 hour  Intake   1513 ml  Output      0 ml  Net   1513 ml     Exam: General: sleepy, however will wake and answer questions appropriately laying in bed on High Fllow nasal cannula Eyes: Negative headache, eye pain, double vision,negative scleral hemorrhage ENT: Negative Runny nose, negative ear pain, negative gingival bleeding, Neck:  Negative scars, masses, torticollis, lymphadenopathy, JVD Lungs: tachypnea, positive diffuse rhonchi with mild expiratory wheezes, negative crackles Cardiovascular: Irregular irregular rhythm and rate without murmur gallop or rub normal S1 and S2 Abdomen:negative abdominal pain, nondistended, positive soft, bowel sounds, no rebound, no ascites, no appreciable mass Extremities: No significant cyanosis, clubbing, or edema bilateral lower extremities Psychiatric:  Negative depression, negative anxiety, negative fatigue, negative mania  Neurologic:  Cranial nerves II through XII intact, tongue/uvula midline, all extremities muscle strength 5/5, sensation intact throughout, negative dysarthria, negative expressive aphasia, negative receptive aphasia.   Data Reviewed: Basic Metabolic Panel:  Recent Labs Lab 06/16/15 0303 06/17/15 0510 06/18/15 0503 06/19/15 0926 06/20/15 0910  NA 139 139 137 135 139  K 3.6 4.3 4.4 5.1 4.6  CL 96* 98* 95* 95* 98*  CO2 28 23 25 23 30   GLUCOSE 135* 88 98 108* 96  BUN 74* 143* 73* 120* 50*  CREATININE 3.87* 6.20* 4.15* 5.96* 4.08*    CALCIUM 8.3* 8.2* 8.1* 8.0* 7.9*  MG 2.2 2.6*  --   --  2.1  PHOS 6.2* 9.9*  --  11.4*  --    Liver Function Tests:  Recent Labs Lab 06/18/15 0503 06/19/15 0926 06/20/15 0910  AST 27  --  25  ALT 23  --  24  ALKPHOS 56  --  55  BILITOT 0.9  --  0.9  PROT 5.7*  --  5.1*  ALBUMIN 2.8* 2.5* 2.3*   No results for input(s): LIPASE, AMYLASE in the last 168 hours. No results for input(s): AMMONIA in the last 168 hours. CBC:  Recent Labs Lab 06/17/15 0510 06/18/15 0503 06/19/15 0340 06/19/15 0926 06/20/15 0329  WBC 16.7* 15.7* 19.3* 19.3* 18.0*  NEUTROABS  --   --   --  16.9*  --   HGB 8.6* 8.8* 9.4* 8.9* 8.7*  HCT 28.2* 28.1* 30.3* 28.3* 28.8*  MCV 98.6 99.6 98.4 97.3 101.1*  PLT 259 237 169 250 220   Cardiac Enzymes: No results for input(s): CKTOTAL, CKMB, CKMBINDEX, TROPONINI in the last 168 hours. BNP (last 3 results)  Recent Labs  05/13/15 2017 06/08/15 0016  BNP 362.1* 892.3*    ProBNP (last 3 results) No results for input(s): PROBNP in the last 8760 hours.  CBG:  Recent Labs Lab 06/17/15 0018 06/17/15 0415 06/17/15 0733 06/17/15 1400 06/18/15 2009  GLUCAP 93 75 84 107* 91    Recent Results (from the past 240 hour(s))  Culture, respiratory (NON-Expectorated)     Status:  None   Collection Time: 06/12/15  3:03 PM  Result Value Ref Range Status   Specimen Description BRONCHIAL ALVEOLAR LAVAGE  Final   Special Requests NONE  Final   Gram Stain   Final    ABUNDANT WBC PRESENT, PREDOMINANTLY PMN NO SQUAMOUS EPITHELIAL CELLS SEEN NO ORGANISMS SEEN    Culture   Final    ABUNDANT ENTEROBACTER CLOACAE Note: TIGECYCLINE  R >=8 COLISTIN  .25ug/ml ETEST results for this drug are "FOR INVESTIGATIONAL USE ONLY" and should NOT be used for clinical purposes. CRE CRITICAL RESULT CALLED TO, READ BACK BY AND VERIFIED WITH: J BISHOP RN 11AM 06/16/15 GUSTK    Report Status 06/18/2015 FINAL  Final   Organism ID, Bacteria ENTEROBACTER CLOACAE  Final       Susceptibility   Enterobacter cloacae - MIC*    CEFAZOLIN Value in next row Resistant      >=64 [facility]ISTANTPerformed at [facility]    CEFEPIME Value in next row Resistant      >=64 RESISTANTPerformed at [facility]    CEFTAZIDIME Value in next row Resistant      >=64 RESISTANTPerformed at [facility]    CEFTRIAXONE Value in next row Resistant      >=64 RESISTANTPerformed at [facility]    CIPROFLOXACIN Value in next row Sensitive      <=0.25 SENSITIVEPerformed at [place] Value in next row Sensitive      <=1 SENSITIVEPerformed at [facility]    IMIPENEM Value in next row       RESISTANTPerformed at [facility]    PIP/TAZO Value in next row Resistant      >=128 RESISTANTPerformed at [facility]    TOBRAMYCIN Value in next row Sensitive      <=1 SENSITIVEPerformed at [facility]    TRIMETH/SULFA Value in next row Sensitive      <=20 SENSITIVE(NOTE)    AMIKACIN Value in next row Sensitive      <=20 SENSITIVE(NOTE)    AZTREONAM Value in next row Resistant      >=64 RESISTANTPerformed at [facility]    * ABUNDANT ENTEROBACTER CLOACAE     Studies:  Recent x-ray studies have been reviewed in detail by the Attending Physician  Scheduled Meds:  Scheduled Meds: . antiseptic oral rinse  7 mL Mouth Rinse BID  . arformoterol  15 mcg Nebulization BID  . atorvastatin  20 mg Oral q1800  . budesonide (PULMICORT) nebulizer solution  0.5 mg Nebulization BID  . chlorhexidine gluconate  15 mL Mouth Rinse BID  . colistimethate  150 mg Inhalation BID  . darbepoetin (ARANESP) injection - DIALYSIS  200 mcg Intravenous Q Mon-HD  . docusate sodium  100 mg Oral BID  . doxercalciferol  2 mcg Intravenous Q M,W,F-HD  . feeding supplement (PRO-STAT SUGAR FREE 64)  30 mL Oral BID  . ipratropium-albuterol  3 mL Nebulization TID  . meropenem (MERREM) IV  1 g Intravenous Q24H  . metoprolol tartrate   50 mg Oral BID  . multivitamin  1 tablet Oral QHS  . predniSONE  10 mg Oral Q breakfast  . sevelamer carbonate  1,600 mg Oral TID WC  . sodium chloride  3 mL Intravenous Q12H  . sulfamethoxazole-trimethoprim  400 mg Intravenous Q24H  . Warfarin - Pharmacist Dosing Inpatient   Does not apply q1800    Time spent on care of this patient: 40 mins  Allie Bossier , MD  [facility] Office  [phone] Pager [phone]  On-Call/Text Page:      [email or web address]      password [email or web address]  If 7PM-7AM, please contact night-coverage [email or web address] Password [email or web address] 06/22/2015, 6:23 AM   LOS: 15 days   Care during the described time interval was provided by me .  I have reviewed this patient's available data, including medical history, events of note, physical examination, and all test results as part of my evaluation. I have personally reviewed and interpreted all radiology studies.   Dia Crawford, MD [phone] Pager

## 2015-06-23 DIAGNOSIS — N186 End stage renal disease: Secondary | ICD-10-CM | POA: Diagnosis present

## 2015-06-23 DIAGNOSIS — D638 Anemia in other chronic diseases classified elsewhere: Secondary | ICD-10-CM | POA: Diagnosis not present

## 2015-06-23 DIAGNOSIS — Z992 Dependence on renal dialysis: Secondary | ICD-10-CM | POA: Diagnosis present

## 2015-06-23 DIAGNOSIS — J9601 Acute respiratory failure with hypoxia: Secondary | ICD-10-CM | POA: Diagnosis not present

## 2015-06-23 DIAGNOSIS — N2581 Secondary hyperparathyroidism of renal origin: Secondary | ICD-10-CM | POA: Diagnosis not present

## 2015-06-23 DIAGNOSIS — J449 Chronic obstructive pulmonary disease, unspecified: Secondary | ICD-10-CM | POA: Diagnosis not present

## 2015-06-23 DIAGNOSIS — I482 Chronic atrial fibrillation: Secondary | ICD-10-CM | POA: Diagnosis not present

## 2015-06-23 DIAGNOSIS — D631 Anemia in chronic kidney disease: Secondary | ICD-10-CM | POA: Diagnosis not present

## 2015-06-23 DIAGNOSIS — I120 Hypertensive chronic kidney disease with stage 5 chronic kidney disease or end stage renal disease: Secondary | ICD-10-CM | POA: Diagnosis not present

## 2015-06-23 DIAGNOSIS — J189 Pneumonia, unspecified organism: Secondary | ICD-10-CM | POA: Diagnosis not present

## 2015-06-23 DIAGNOSIS — I25700 Atherosclerosis of coronary artery bypass graft(s), unspecified, with unstable angina pectoris: Secondary | ICD-10-CM | POA: Diagnosis not present

## 2015-06-23 DIAGNOSIS — D62 Acute posthemorrhagic anemia: Secondary | ICD-10-CM | POA: Diagnosis not present

## 2015-06-23 DIAGNOSIS — E875 Hyperkalemia: Secondary | ICD-10-CM

## 2015-06-23 LAB — BASIC METABOLIC PANEL
Anion gap: 10 (ref 5–15)
BUN: 42 mg/dL — ABNORMAL HIGH (ref 6–20)
CALCIUM: 8.1 mg/dL — AB (ref 8.9–10.3)
CHLORIDE: 93 mmol/L — AB (ref 101–111)
CO2: 29 mmol/L (ref 22–32)
Creatinine, Ser: 5 mg/dL — ABNORMAL HIGH (ref 0.61–1.24)
GFR calc Af Amer: 12 mL/min — ABNORMAL LOW (ref >60)
GFR, EST NON AFRICAN AMERICAN: 10 mL/min — AB (ref >60)
GLUCOSE: 109 mg/dL — AB (ref 65–99)
POTASSIUM: 6.1 mmol/L — AB (ref 3.5–5.1)
Sodium: 132 mmol/L — ABNORMAL LOW (ref 135–145)

## 2015-06-23 LAB — CBC
HCT: 28.3 % — ABNORMAL LOW (ref 39.0–52.0)
Hemoglobin: 8.7 g/dL — ABNORMAL LOW (ref 13.0–17.0)
MCH: 30.7 pg (ref 26.0–34.0)
MCHC: 30.7 g/dL (ref 30.0–36.0)
MCV: 100 fL (ref 78.0–100.0)
PLATELETS: 227 10*3/uL (ref 150–400)
RBC: 2.83 MIL/uL — AB (ref 4.22–5.81)
RDW: 21 % — ABNORMAL HIGH (ref 11.5–15.5)
WBC: 18.6 10*3/uL — AB (ref 4.0–10.5)

## 2015-06-23 LAB — MAGNESIUM: Magnesium: 2.3 mg/dL (ref 1.7–2.4)

## 2015-06-23 LAB — PROTIME-INR
INR: 1.17 (ref 0.00–1.49)
Prothrombin Time: 15.1 seconds (ref 11.6–15.2)

## 2015-06-23 MED ORDER — SODIUM CHLORIDE 0.9 % IV SOLN: 100.0000 mL | INTRAVENOUS | Status: DC | PRN

## 2015-06-23 MED ORDER — WARFARIN SODIUM 4 MG PO TABS
4.0000 mg | ORAL_TABLET | Freq: Once | ORAL | Status: AC
  Administered 2015-06-23: 4 mg via ORAL
  Filled 2015-06-23: qty 1

## 2015-06-23 MED ORDER — HEPARIN SODIUM (PORCINE) 1000 UNIT/ML DIALYSIS
1000.0000 [IU] | INTRAMUSCULAR | Status: DC | PRN
  Filled 2015-06-23: qty 1

## 2015-06-23 MED ORDER — LIDOCAINE-PRILOCAINE 2.5-2.5 % EX CREA
1.0000 "application " | TOPICAL_CREAM | CUTANEOUS | Status: DC | PRN
  Filled 2015-06-23: qty 5

## 2015-06-23 MED ORDER — LIDOCAINE-PRILOCAINE 2.5-2.5 % EX CREA: 1.0000 "application " | TOPICAL_CREAM | CUTANEOUS | Status: DC | PRN

## 2015-06-23 MED ORDER — HEPARIN SODIUM (PORCINE) 1000 UNIT/ML DIALYSIS: 1000.0000 [IU] | INTRAMUSCULAR | Status: DC | PRN

## 2015-06-23 MED ORDER — LIDOCAINE HCL (PF) 1 % IJ SOLN: 5.0000 mL | INTRAMUSCULAR | Status: DC | PRN

## 2015-06-23 MED ORDER — ALTEPLASE 2 MG IJ SOLR: 2.0000 mg | Freq: Once | INTRAMUSCULAR | Status: DC | PRN

## 2015-06-23 MED ORDER — PENTAFLUOROPROP-TETRAFLUOROETH EX AERO: 1.0000 "application " | INHALATION_SPRAY | CUTANEOUS | Status: DC | PRN

## 2015-06-23 MED ORDER — HEPARIN SODIUM (PORCINE) 1000 UNIT/ML DIALYSIS
2500.0000 [IU] | INTRAMUSCULAR | Status: DC | PRN
  Filled 2015-06-23: qty 3

## 2015-06-23 MED ORDER — HEPARIN SODIUM (PORCINE) 1000 UNIT/ML DIALYSIS: 2500.0000 [IU] | INTRAMUSCULAR | Status: DC | PRN

## 2015-06-23 MED ORDER — ALTEPLASE 2 MG IJ SOLR
2.0000 mg | Freq: Once | INTRAMUSCULAR | Status: DC | PRN
  Filled 2015-06-23: qty 2

## 2015-06-23 MED ORDER — HEPARIN SODIUM (PORCINE) 1000 UNIT/ML DIALYSIS
3500.0000 [IU] | INTRAMUSCULAR | Status: DC | PRN
  Filled 2015-06-23: qty 4

## 2015-06-23 MED ORDER — METHOCARBAMOL 1000 MG/10ML IJ SOLN
500.0000 mg | Freq: Once | INTRAVENOUS | Status: DC
  Filled 2015-06-23: qty 5

## 2015-06-23 NOTE — Progress Notes (Signed)
[name] Progress Note  Assessment:  1. HCAP MDR enterobacter/ resp failure/ COPD: extubated 12/25. Pred/ nebs. Chest tube dc'd. Meropenem/ bactrim. Last CXR infiltrates resolved 2. ESRD - MWF HD. TDC + maturing R arm AVF 3. Anemia - Hgb 8.9. On ESA. Follow Hgb.  4. Secondary hyperparathyroidism - Ca  8.0 C Ca 9.2 Stopped daily calcitriol. Resumed hectoral with HD. Phos 11.4. Started renvela 1600 mg PO TID with meals.  5. Volume - euvolemic, 6kg under prior dry wt 6. Nutrition - Albumin 2.5. Dysphagia I diet. Add prostat 7. Afib - on MTP 50 bid + coumadin 8. HTN - on MTP only for afib 9 Debil improving, sat on side of bed to eat today    Plan - HD Monday in the chair.  Short HD today for K 6.1.    Kelly Splinter MD [facility] pager [phone]    cell [phone] 06/23/2015, 12:21 PM    Subjective: feeling good, no cough or SOB.   Objective Filed Vitals:   06/23/15 0801 06/23/15 0846 06/23/15 0933 06/23/15 1100  BP:  152/71 145/49 140/74  Pulse:  95 89 92  Temp:  97.8 F (36.6 C)  97.8 F (36.6 C)  TempSrc:  Oral  Oral  Resp:  20  18  Height:      Weight:      SpO2: 100% 93%  97%   Physical Exam General: chronically ill appearing male in NAD Heart: S1, S2. No R/G/M Lungs: some basilar crackels, mostlyclear Abdomen: active BS. Nontender Extremities: No LE edema.  Dialysis Access: Maturing RUA AVF + bruit. R perm cath Drsg CDI.   Dialysis Orders: [facility] MWF  4h 2/2.25 bath 86kg Hep 8600 R IJ cath / RUE AVF(maturing) Venofer 100/ hd x 10  Hect 1 ug   Additional Objective Labs: Basic Metabolic Panel:  Recent Labs Lab 06/17/15 0510  06/19/15 0926 06/20/15 0910 06/22/15 0715 06/23/15 0910  NA 139  < > 135 139 136 132*  K 4.3  < > 5.1 4.6 5.4* 6.1*  CL 98*  < > 95* 98* 97* 93*  CO2 23  < > 23 30 31 29   GLUCOSE 88  < > 108* 96 102* 109*  BUN 143*  < > 120* 50* 27* 42*  CREATININE 6.20*  < > 5.96* 4.08* 3.67* 5.00*   CALCIUM 8.2*  < > 8.0* 7.9* 7.9* 8.1*  PHOS 9.9*  --  11.4*  --   --   --   < > = values in this interval not displayed. Liver Function Tests:  Recent Labs Lab 06/18/15 0503 06/19/15 0926 06/20/15 0910 06/22/15 0715  AST 27  --  25 28  ALT 23  --  24 26  ALKPHOS 56  --  55 54  BILITOT 0.9  --  0.9 0.6  PROT 5.7*  --  5.1* 4.8*  ALBUMIN 2.8* 2.5* 2.3* 2.2*   No results for input(s): LIPASE, AMYLASE in the last 168 hours. CBC:  Recent Labs Lab 06/19/15 0340 06/19/15 0926 06/20/15 0329 06/22/15 0715 06/23/15 0910  WBC 19.3* 19.3* 18.0* 13.3* 18.6*  NEUTROABS  --  16.9*  --  11.3*  --   HGB 9.4* 8.9* 8.7* 8.3* 8.7*  HCT 30.3* 28.3* 28.8* 27.3* 28.3*  MCV 98.4 97.3 101.1* 102.6* 100.0  PLT 169 250 220 213 227   Blood Culture    Component Value Date/Time   SDES BRONCHIAL ALVEOLAR LAVAGE 06/12/2015 1503   [place] 06/12/2015 1503  CULT  06/12/2015 1503    ABUNDANT ENTEROBACTER CLOACAE Note: TIGECYCLINE  R >=8 COLISTIN  .25ug/ml ETEST results for this drug are "FOR INVESTIGATIONAL USE ONLY" and should NOT be used for clinical purposes. CRE CRITICAL RESULT CALLED TO, READ BACK BY AND VERIFIED WITH: J BISHOP RN 11AM 06/16/15 GUSTK    REPTSTATUS 06/18/2015 FINAL 06/12/2015 1503    Cardiac Enzymes: No results for input(s): CKTOTAL, CKMB, CKMBINDEX, TROPONINI in the last 168 hours. CBG:  Recent Labs Lab 06/17/15 0018 06/17/15 0415 06/17/15 0733 06/17/15 1400 06/18/15 2009  GLUCAP 93 75 84 107* 91   Iron Studies: No results for input(s): IRON, TIBC, TRANSFERRIN, FERRITIN in the last 72 hours. @lablastinr3 @ Studies/Results: No results found. Medications:   . antiseptic oral rinse  7 mL Mouth Rinse BID  . arformoterol  15 mcg Nebulization BID  . atorvastatin  20 mg Oral q1800  . budesonide (PULMICORT) nebulizer solution  0.5 mg Nebulization BID  . chlorhexidine gluconate  15 mL Mouth Rinse BID  . colistimethate  150 mg Inhalation BID  . darbepoetin  (ARANESP) injection - DIALYSIS  200 mcg Intravenous Q Mon-HD  . docusate sodium  100 mg Oral BID  . doxercalciferol  2 mcg Intravenous Q M,W,F-HD  . feeding supplement (PRO-STAT SUGAR FREE 64)  30 mL Oral BID  . ipratropium-albuterol  3 mL Nebulization TID  . meropenem (MERREM) IV  1 g Intravenous Q24H  . metoprolol tartrate  50 mg Oral BID  . multivitamin  1 tablet Oral QHS  . predniSONE  10 mg Oral Q breakfast  . sevelamer carbonate  1,600 mg Oral TID WC  . sodium chloride  3 mL Intravenous Q12H  . sulfamethoxazole-trimethoprim  400 mg Intravenous Q24H  . warfarin  4 mg Oral ONCE-1800  . Warfarin - Pharmacist Dosing Inpatient   Does not apply [phone]

## 2015-06-23 NOTE — Progress Notes (Signed)
[place] TEAM 1 - Stepdown/ICU TEAM Progress Note  Terry [name] [id number] DOB: 08-06-1934 DOA: 06/07/2015 PCP: [name], DO  Admit HPI / Brief Narrative: 81 y.o. WM PMHx ESRD on HD just started on HD 2 wks, A-Fib on anticoagulation,Chronic Diastolic CHF,CAD native artery, COPD, Chronic Respiratory Failure Discharged on December 13,   Admitted for acute on chronic renal failure. He was deemed to be end-stage renal disease and was started on dialysis. After discharge, patient was feeling well, but he did have a cough. The cough got worse over the last 2 days. He has been coughing up yellowish expectoration. Denies any fever per se. His breathing has become more labored. He went for his dialysis on Wednesday, which he tolerated well. And then he was going for his dialysis today when he felt more short of breath. During dialysis because patient became acutely short of breath and was noted to have low oxygen saturations. Patient was subsequently transported over to the emergency department. He complains of some right-sided chest pain. No nausea, vomiting. No difficulty with swallowing or choking spell with swallowing. No dizziness or lightheadedness. After getting breathing treatments in the emergency department he is feeling better. He was found to have a temperature of 31F, he was tachycardic, his sats were in the 70s when he presented to the hospital. Chest x-ray revealed bibasilar opacities. He has slightly elevated WBC. Presentation is highly suggestive of pneumonia. Hospitalist was called for admission. Patient still requiring 100% nonrebreather, although it could be titrated down   HPI/Subjective: 1/1 A/O 4, on High Fllow nasal cannula. Negative CP. Slight hemoptysis overnight coating inside of suction tube but not enough to reach canister .  Assessment/Plan: Acute hypoxemic respiratory failure -slowly improving  - extubated 12/25 tolerating high flow nasal cannula at 10 L  /min. - Very drug-resistant Enterobacter cloacae HCAP -ID and Pharm directing abx selection/delivery   Lt  PTX -12/28 Left Chest Tube per PCCM; -CXR stable   COPD w/ acute exacerbation  -Acute exacerbation appears to have resolved - -Continue prednisone 10 mg daily  -PFTs suggest very severe COPD, should he decompensate again from pulm status, re-intubation is highly likely to be required  -Patient continues to have difficulty weaning; is there a cardiac component; Echocardiogram showed mild pulmonary hypertension  HTN  -Slightly elevated however patient's BP volatile, will not add additional medication    CAD s/p CABG -Asymptomatic at this time -Transfuse for hemoglobin<8  Chronic A-Fib on coumadin  -Rate controlled -Continue  Coumadin at low dose. Although patient is having mild hemoptysis H/H stable, may just be residual will continue to monitor closely.   Chronic grade 1 Diastolic heart failure -No evidence of severe volume overload at this time - volume management per HD   ESRD on HD M/W/F  -HD per Nephrology   Hyperkalemia -1/1 Kayexalate canceled as nephrology will perform HD 1 day early   Anemia of chronic disease -Stable  -Patient's suction tubing mild amount of blood, not enough to reach canister and H/H stable. Continue Coumadin at this time.   Hx of CVA  Code Status: FULL Family Communication: None  Disposition Plan: Per ID    Consultants: Dr.Robert Schertz Nephrology Dr.Robert W Comer ID Dr.Douglas B McQuaid PCCM  Procedure/Significant Events: 12/16 intubated, L tension ptx post intubation, chest tube placed in ER  extubated  12/31 echocardiogram;- Left ventricle: moderateconcentric hypertrophy. LVEF = 65% to 70%.  - Pulmonary arteries: PApeak pressure: 39 mm Hg (S).   Culture Urine  12/16>>> no UOP BC x 2 12/16>>> Sputum 12/16>>>klieb, int ceftriaxone   Antibiotics: vanc 12/16>>>12/19 Cefepime 12/16>>>12/19 ceftaz 12/19>>>stop  12/23 Meropenem 12/28>> Bactrim 12/28>>   DVT prophylaxis: Coumadin   Devices   LINES / TUBES:  ETT 12/16>>>12/18 R chest perm cath >>> L chest tube 12/16>>>     Continuous Infusions:   Objective: VITAL SIGNS: Temp: 97.8 F (36.6 C) (01/01 1100) Temp Source: Oral (01/01 1100) BP: 140/74 mmHg (01/01 1100) Pulse Rate: 92 (01/01 1100) SPO2; FIO2:   Intake/Output Summary (Last 24 hours) at 06/23/15 1307 Last data filed at 06/23/15 0900  Gross per 24 hour  Intake    480 ml  Output      0 ml  Net    480 ml     Exam: General: A/O 4, continues to be frustrated with slow progress of improvement. Laying in bed on High Fllow nasal cannula Eyes: Negative headache, eye pain, double vision,negative scleral hemorrhage ENT: Negative Runny nose, negative ear pain, negative gingival bleeding, Neck:  Negative scars, masses, torticollis, lymphadenopathy, JVD Lungs: tachypnea, positive diffuse rhonchi with mild expiratory wheezes, negative crackles Cardiovascular: Irregular irregular rhythm and rate without murmur gallop or rub normal S1 and S2 Abdomen:negative abdominal pain, nondistended, positive soft, bowel sounds, no rebound, no ascites, no appreciable mass Extremities: No significant cyanosis, clubbing, or edema bilateral lower extremities Psychiatric:  Negative depression, negative anxiety, negative fatigue, negative mania  Neurologic:  Cranial nerves II through XII intact, tongue/uvula midline, all extremities muscle strength 5/5, sensation intact throughout, negative dysarthria, negative expressive aphasia, negative receptive aphasia.   Data Reviewed: Basic Metabolic Panel:  Recent Labs Lab 06/17/15 0510 06/18/15 0503 06/19/15 0926 06/20/15 0910 06/22/15 0715 06/23/15 0910  NA 139 137 135 139 136 132*  K 4.3 4.4 5.1 4.6 5.4* 6.1*  CL 98* 95* 95* 98* 97* 93*  CO2 23 25 23 30 31 29   GLUCOSE 88 98 108* 96 102* 109*  BUN 143* 73* 120* 50* 27* 42*   CREATININE 6.20* 4.15* 5.96* 4.08* 3.67* 5.00*  CALCIUM 8.2* 8.1* 8.0* 7.9* 7.9* 8.1*  MG 2.6*  --   --  2.1 2.0 2.3  PHOS 9.9*  --  11.4*  --   --   --    Liver Function Tests:  Recent Labs Lab 06/18/15 0503 06/19/15 0926 06/20/15 0910 06/22/15 0715  AST 27  --  25 28  ALT 23  --  24 26  ALKPHOS 56  --  55 54  BILITOT 0.9  --  0.9 0.6  PROT 5.7*  --  5.1* 4.8*  ALBUMIN 2.8* 2.5* 2.3* 2.2*   No results for input(s): LIPASE, AMYLASE in the last 168 hours. No results for input(s): AMMONIA in the last 168 hours. CBC:  Recent Labs Lab 06/19/15 0340 06/19/15 0926 06/20/15 0329 06/22/15 0715 06/23/15 0910  WBC 19.3* 19.3* 18.0* 13.3* 18.6*  NEUTROABS  --  16.9*  --  11.3*  --   HGB 9.4* 8.9* 8.7* 8.3* 8.7*  HCT 30.3* 28.3* 28.8* 27.3* 28.3*  MCV 98.4 97.3 101.1* 102.6* 100.0  PLT 169 250 220 213 227   Cardiac Enzymes: No results for input(s): CKTOTAL, CKMB, CKMBINDEX, TROPONINI in the last 168 hours. BNP (last 3 results)  Recent Labs  05/13/15 2017 06/08/15 0016  BNP 362.1* 892.3*    ProBNP (last 3 results) No results for input(s): PROBNP in the last 8760 hours.  CBG:  Recent Labs Lab 06/17/15 0018 06/17/15 0415 06/17/15 0733 06/17/15 1400 06/18/15  2009  GLUCAP 93 75 84 107* 91    No results found for this or any previous visit (from the past 240 hour(s)).   Studies:  Recent x-ray studies have been reviewed in detail by the Attending Physician  Scheduled Meds:  Scheduled Meds: . antiseptic oral rinse  7 mL Mouth Rinse BID  . arformoterol  15 mcg Nebulization BID  . atorvastatin  20 mg Oral q1800  . budesonide (PULMICORT) nebulizer solution  0.5 mg Nebulization BID  . chlorhexidine gluconate  15 mL Mouth Rinse BID  . colistimethate  150 mg Inhalation BID  . darbepoetin (ARANESP) injection - DIALYSIS  200 mcg Intravenous Q Mon-HD  . docusate sodium  100 mg Oral BID  . doxercalciferol  2 mcg Intravenous Q M,W,F-HD  . feeding supplement  (PRO-STAT SUGAR FREE 64)  30 mL Oral BID  . ipratropium-albuterol  3 mL Nebulization TID  . meropenem (MERREM) IV  1 g Intravenous Q24H  . methocarbamol (ROBAXIN)  IV  500 mg Intravenous Once  . metoprolol tartrate  50 mg Oral BID  . multivitamin  1 tablet Oral QHS  . predniSONE  10 mg Oral Q breakfast  . sevelamer carbonate  1,600 mg Oral TID WC  . sodium chloride  3 mL Intravenous Q12H  . sulfamethoxazole-trimethoprim  400 mg Intravenous Q24H  . warfarin  4 mg Oral ONCE-1800  . Warfarin - Pharmacist Dosing Inpatient   Does not apply q1800    Time spent on care of this patient: 40 mins   Lashe Oliveira, Geraldo Docker , MD  [facility] Office  [phone] Pager - [phone]  On-Call/Text Page:      [email or web address]      password [email or web address]  If 7PM-7AM, please contact night-coverage [email or web address] Password [email or web address] 06/23/2015, 1:07 PM   LOS: 16 days   Care during the described time interval was provided by me .  I have reviewed this patient's available data, including medical history, events of note, physical examination, and all test results as part of my evaluation. I have personally reviewed and interpreted all radiology studies.   Dia Crawford, MD [phone] Pager

## 2015-06-23 NOTE — Progress Notes (Signed)
ANTICOAGULATION CONSULT NOTE - FOLLOW UP  Pharmacy Consult:  Coumadin Indication: atrial fibrillation  Allergies  Allergen Reactions  . Yellow Dyes (Non-Tartrazine) Other (See Comments)    Burns arms Cough medications (tussionex and benzonatate ok)  . Levaquin [Levofloxacin In D5w] Itching    Patient Measurements: Height: 5\' 7"  (170.2 cm) Weight: 180 lb 12.4 oz (82 kg) IBW/kg (Calculated) : 66.1  Vital Signs: Temp: 97.8 F (36.6 C) (01/01 1100) Temp Source: Oral (01/01 1100) BP: 140/74 mmHg (01/01 1100) Pulse Rate: 92 (01/01 1100)  Labs:  Recent Labs  06/21/15 0311 06/22/15 0352 06/22/15 0715 06/23/15 0507 06/23/15 0910  HGB  --   --  8.3*  --  8.7*  HCT  --   --  27.3*  --  28.3*  PLT  --   --  213  --  227  LABPROT 12.9 14.3  --  15.1  --   INR 0.95 1.09  --  1.17  --   CREATININE  --   --  3.67*  --  5.00*    Estimated Creatinine Clearance: 12.5 mL/min (by C-G formula based on Cr of 5).   Assessment: 42 YOM with history of Afib continued on Coumadin from PTA upon admit.  Anticoagulation held on 06/12/15 after finding of bleeding into lungs during bronch.  Chest tube removed on 06/19/15 and Coumadin resumed 06/20/15.    His INR continues to be low at 1.1.  Noted he is currently on Septra, which could increase patient's INR.  Home Coumadin dose:  4mg  PO daily   Goal of Therapy:  INR 2-3  Resolution of infection    Plan:  - Coumadin 4mg  PO today given very little response with 2mg  the past 3 days. Will follow closely to evaluate Septra's effect on Coumadin. - Daily PT / INR - Monitor closely for s/sx of bleeding - Continue Septra 400mg  IV Q24H - Continue Merrem 1gm IV Q24H - Continue Inhaled colistin 150mg  IH BID - ID notes to continue abx through 1/6 then stop  Erin Hearing PharmD., BCPS Clinical Pharmacist Pager [phone] 06/23/2015 11:48 AM

## 2015-06-24 DIAGNOSIS — N2581 Secondary hyperparathyroidism of renal origin: Secondary | ICD-10-CM | POA: Diagnosis not present

## 2015-06-24 DIAGNOSIS — N186 End stage renal disease: Secondary | ICD-10-CM | POA: Diagnosis not present

## 2015-06-24 DIAGNOSIS — D631 Anemia in chronic kidney disease: Secondary | ICD-10-CM | POA: Diagnosis not present

## 2015-06-24 DIAGNOSIS — Z992 Dependence on renal dialysis: Secondary | ICD-10-CM | POA: Diagnosis not present

## 2015-06-24 DIAGNOSIS — E1122 Type 2 diabetes mellitus with diabetic chronic kidney disease: Secondary | ICD-10-CM | POA: Diagnosis not present

## 2015-06-24 DIAGNOSIS — I120 Hypertensive chronic kidney disease with stage 5 chronic kidney disease or end stage renal disease: Secondary | ICD-10-CM | POA: Diagnosis not present

## 2015-06-24 DIAGNOSIS — J189 Pneumonia, unspecified organism: Secondary | ICD-10-CM | POA: Diagnosis not present

## 2015-06-24 LAB — POCT I-STAT, CHEM 8
BUN: 6 mg/dL (ref 6–20)
Calcium, Ion: 1.14 mmol/L (ref 1.13–1.30)
Chloride: 94 mmol/L — ABNORMAL LOW (ref 101–111)
Creatinine, Ser: 1.1 mg/dL (ref 0.61–1.24)
Glucose, Bld: 103 mg/dL — ABNORMAL HIGH (ref 65–99)
HEMATOCRIT: 27 % — AB (ref 39.0–52.0)
HEMOGLOBIN: 9.2 g/dL — AB (ref 13.0–17.0)
POTASSIUM: 3.1 mmol/L — AB (ref 3.5–5.1)
SODIUM: 135 mmol/L (ref 135–145)
TCO2: 29 mmol/L (ref 0–100)

## 2015-06-24 LAB — RENAL FUNCTION PANEL
ALBUMIN: 2.3 g/dL — AB (ref 3.5–5.0)
ANION GAP: 5 (ref 5–15)
BUN: 34 mg/dL — ABNORMAL HIGH (ref 6–20)
CALCIUM: 8 mg/dL — AB (ref 8.9–10.3)
CO2: 29 mmol/L (ref 22–32)
Chloride: 99 mmol/L — ABNORMAL LOW (ref 101–111)
Creatinine, Ser: 4.3 mg/dL — ABNORMAL HIGH (ref 0.61–1.24)
GFR calc non Af Amer: 12 mL/min — ABNORMAL LOW (ref >60)
GFR, EST AFRICAN AMERICAN: 14 mL/min — AB (ref >60)
Glucose, Bld: 123 mg/dL — ABNORMAL HIGH (ref 65–99)
Phosphorus: 4.5 mg/dL (ref 2.5–4.6)
Potassium: 6.3 mmol/L (ref 3.5–5.1)
SODIUM: 133 mmol/L — AB (ref 135–145)

## 2015-06-24 LAB — CBC
HCT: 26.8 % — ABNORMAL LOW (ref 39.0–52.0)
HEMOGLOBIN: 8 g/dL — AB (ref 13.0–17.0)
MCH: 30.8 pg (ref 26.0–34.0)
MCHC: 29.9 g/dL — ABNORMAL LOW (ref 30.0–36.0)
MCV: 103.1 fL — ABNORMAL HIGH (ref 78.0–100.0)
Platelets: 188 10*3/uL (ref 150–400)
RBC: 2.6 MIL/uL — AB (ref 4.22–5.81)
RDW: 21.5 % — ABNORMAL HIGH (ref 11.5–15.5)
WBC: 15.5 10*3/uL — ABNORMAL HIGH (ref 4.0–10.5)

## 2015-06-24 LAB — BASIC METABOLIC PANEL
ANION GAP: 9 (ref 5–15)
BUN: 26 mg/dL — ABNORMAL HIGH (ref 6–20)
CO2: 29 mmol/L (ref 22–32)
Calcium: 8 mg/dL — ABNORMAL LOW (ref 8.9–10.3)
Chloride: 100 mmol/L — ABNORMAL LOW (ref 101–111)
Creatinine, Ser: 3.46 mg/dL — ABNORMAL HIGH (ref 0.61–1.24)
GFR calc Af Amer: 18 mL/min — ABNORMAL LOW (ref >60)
GFR, EST NON AFRICAN AMERICAN: 16 mL/min — AB (ref >60)
GLUCOSE: 118 mg/dL — AB (ref 65–99)
POTASSIUM: 6.2 mmol/L — AB (ref 3.5–5.1)
Sodium: 138 mmol/L (ref 135–145)

## 2015-06-24 LAB — PROTIME-INR
INR: 1.24 (ref 0.00–1.49)
Prothrombin Time: 15.7 seconds — ABNORMAL HIGH (ref 11.6–15.2)

## 2015-06-24 LAB — MAGNESIUM: MAGNESIUM: 2.2 mg/dL (ref 1.7–2.4)

## 2015-06-24 MED ORDER — WARFARIN SODIUM 4 MG PO TABS
4.0000 mg | ORAL_TABLET | Freq: Once | ORAL | Status: AC
  Administered 2015-06-24: 4 mg via ORAL
  Filled 2015-06-24: qty 1

## 2015-06-24 MED ORDER — DARBEPOETIN ALFA 200 MCG/0.4ML IJ SOSY
PREFILLED_SYRINGE | INTRAMUSCULAR | Status: AC
  Filled 2015-06-24: qty 0.4

## 2015-06-24 MED ORDER — DOXERCALCIFEROL 4 MCG/2ML IV SOLN
INTRAVENOUS | Status: AC
  Administered 2015-06-24: 2 ug via INTRAVENOUS
  Filled 2015-06-24: qty 2

## 2015-06-24 MED ORDER — DOCUSATE SODIUM 100 MG PO CAPS: 100.0000 mg | ORAL_CAPSULE | Freq: Two times a day (BID) | ORAL | Status: DC | PRN

## 2015-06-24 NOTE — Procedures (Signed)
Patient seen on Hemodialysis. QB 350, UF goal 2.5L Treatment adjusted as needed.  Elmarie Shiley MD [facility] Office # [phone] Pager # [phone] 2:35 PM

## 2015-06-24 NOTE — Progress Notes (Signed)
ANTICOAGULATION CONSULT NOTE - FOLLOW UP  Pharmacy Consult:  Coumadin Indication: atrial fibrillation  Allergies  Allergen Reactions  . Yellow Dyes (Non-Tartrazine) Other (See Comments)    Burns arms Cough medications (tussionex and benzonatate ok)  . Levaquin [Levofloxacin In D5w] Itching    Patient Measurements: Height: 5\' 7"  (170.2 cm) Weight: 175 lb 11.3 oz (79.7 kg) IBW/kg (Calculated) : 66.1  Vital Signs: Temp: 98.1 F (36.7 C) (01/02 0500) Temp Source: Oral (01/02 0500) BP: 132/80 mmHg (01/02 0500) Pulse Rate: 96 (01/02 0500)  Labs:  Recent Labs  06/22/15 0352  06/22/15 0715 06/23/15 0507 06/23/15 0910 06/24/15 0321  HGB  --   < > 8.3*  --  8.7* 8.0*  HCT  --   --  27.3*  --  28.3* 26.8*  PLT  --   --  213  --  227 188  LABPROT 14.3  --   --  15.1  --  15.7*  INR 1.09  --   --  1.17  --  1.24  CREATININE  --   --  3.67*  --  5.00* 3.46*  < > = values in this interval not displayed.  Estimated Creatinine Clearance: 17.8 mL/min (by C-G formula based on Cr of 3.46).   Assessment: 61 YOM with history of Afib continued on Coumadin from PTA upon admit.  Anticoagulation held on 06/12/15 after finding of bleeding into lungs during bronch.  Chest tube removed on 06/19/15 and Coumadin resumed 06/20/15.    His INR continues to be low at 1.24 - but slight rise after 4 mg dose last PM.  Noted he is currently on Septra, which could increase patient's INR. Home Coumadin dose:  4mg  PO daily  Goal of Therapy:  INR 2-3    Plan:  - Coumadin 4mg  PO again today given very little response with 2mg  the past 3 days. Will follow closely to evaluate Septra's effect on Coumadin. - Daily PT / INR  Sloan Leiter, PharmD, BCPS Clinical Pharmacist [phone]  06/24/2015 7:35 AM

## 2015-06-24 NOTE — Progress Notes (Addendum)
[place] TEAM 1 - Stepdown/ICU TEAM PROGRESS NOTE  Terry [name] [id number] DOB: 29-Dec-1934 DOA: 06/07/2015 PCP: [name], DO  Admit HPI / Brief Narrative: 81yo male with hx ESRD, AFib, COPD, CVA presented 12/16 to the ED via EMS with a 2 day history of progressive SOB and cough with yellow sputum. Had worsening respiratory status while at HD and EMS was called.   Significant Events: 12/16 intubated, L tension ptx post intubation, chest tube placed in ER  12/18 extubated 12/19 worsening distress, HD, bipap, small PTX resolved after chest tube back to suction 12/20 weak resp status remains, failed high flow nasal cannula O2 reservoir  12/21 required intubation, bronch w/ bloody clots, no DAH 12/25 extubated   HPI/Subjective: The pt is c/o diarrhea, and sore buttocks with some skin irritation.  He despises his restricted diet, and reports very poor intake.  He denies cp, n/v, sob, or abdom pain.  He is curently on high flow Halfway oxygen, but sats are 95% or >.   Assessment/Plan:  Acute hypoxemic respiratory failure Appears to be slowly improving - extubated 12/25 - appears to be steadily improving - titrate off HF [place] today and follow sats   Very drug-resistant Enterobacter cloacae HCAP ID and Pharm directing abx selection/delivery - current abx to continue through 06/28/15   L PTX - resolved CT removed per PCCM - f/u CXR stable   COPD w/ acute exacerbation  Acute exacerbation appears to have resolved - d/c steroid today - PCCM states his PFTs suggest very severe COPD - should he decompensate again from pulm status, re-intubation is highly likely to be required   HTN  BP currently controlled   CAD s/p CABG Asymptomatic at this time  Chronic AFib on coumadin  Rate controlled - coumadin had been on hold due to recent bloody resp secretions - resumed per Rx at this time   Chronic grade 1 Diastolic heart failure No evidence of severe volume overload at this time -  volume management per HD   ESRD on HD HD per Nephrology   Hyperkalemia For repeat HD today per Nephrology   Anemia of chronic disease Hgb holding steady   Hx of CVA  Code Status: FULL Family Communication: spoke w/ wife at bedside at length  Disposition Plan: SDU due to high potential to acutely decompensate - titrate O2 as able - attempt HD in chair today - possible d/c to SNF for rehab in 24-48hrs   Consultants: Nephrology ID PCCM  Antibiotics: Per ID service   DVT prophylaxis: warfarin  Objective: Blood pressure 142/66, pulse 93, temperature 97.7 F (36.5 C), temperature source Oral, resp. rate 20, height 5\' 7"  (1.702 m), weight 79.7 kg (175 lb 11.3 oz), SpO2 100 %.  Intake/Output Summary (Last 24 hours) at 06/24/15 1100 Last data filed at 06/23/15 2040  Gross per 24 hour  Intake    320 ml  Output   1000 ml  Net   -680 ml   Exam: General: No acute respiratory distress up in chair at bedside  Lungs: Clear to auscultation bilaterally - no focal crackles  Cardiovascular: Regular rate without murmur or rub  Abdomen: Nontender, nondistended, soft, bowel sounds positive, no rebound, no ascites, no appreciable mass Extremities: No significant cyanosis, clubbing, edema bilateral lower extremities  Data Reviewed: Basic Metabolic Panel:  Recent Labs Lab 06/19/15 0926 06/20/15 0910 06/22/15 0715 06/23/15 0910 06/24/15 0321  NA 135 139 136 132* 138  K 5.1 4.6 5.4* 6.1* 6.2*  CL 95* 98* 97* 93* 100*  CO2 23 30 31 29 29   GLUCOSE 108* 96 102* 109* 118*  BUN 120* 50* 27* 42* 26*  CREATININE 5.96* 4.08* 3.67* 5.00* 3.46*  CALCIUM 8.0* 7.9* 7.9* 8.1* 8.0*  MG  --  2.1 2.0 2.3 2.2  PHOS 11.4*  --   --   --   --     CBC:  Recent Labs Lab 06/19/15 0926 06/20/15 0329 06/22/15 0715 06/23/15 0910 06/24/15 0321  WBC 19.3* 18.0* 13.3* 18.6* 15.5*  NEUTROABS 16.9*  --  11.3*  --   --   HGB 8.9* 8.7* 8.3* 8.7* 8.0*  HCT 28.3* 28.8* 27.3* 28.3* 26.8*  MCV 97.3  101.1* 102.6* 100.0 103.1*  PLT 250 220 213 227 188    Liver Function Tests:  Recent Labs Lab 06/18/15 0503 06/19/15 0926 06/20/15 0910 06/22/15 0715  AST 27  --  25 28  ALT 23  --  24 26  ALKPHOS 56  --  55 54  BILITOT 0.9  --  0.9 0.6  PROT 5.7*  --  5.1* 4.8*  ALBUMIN 2.8* 2.5* 2.3* 2.2*   Coags:  Recent Labs Lab 06/21/15 0311 06/22/15 0352 06/23/15 0507 06/24/15 0321  INR 0.95 1.09 1.17 1.24   CBG:  Recent Labs Lab 06/17/15 1400 06/18/15 2009  GLUCAP 107* 91    Studies:   Recent x-ray studies have been reviewed in detail by the Attending Physician  Scheduled Meds:  Scheduled Meds: . antiseptic oral rinse  7 mL Mouth Rinse BID  . arformoterol  15 mcg Nebulization BID  . atorvastatin  20 mg Oral q1800  . budesonide (PULMICORT) nebulizer solution  0.5 mg Nebulization BID  . chlorhexidine gluconate  15 mL Mouth Rinse BID  . colistimethate  150 mg Inhalation BID  . darbepoetin (ARANESP) injection - DIALYSIS  200 mcg Intravenous Q Mon-HD  . docusate sodium  100 mg Oral BID  . doxercalciferol  2 mcg Intravenous Q M,W,F-HD  . feeding supplement (PRO-STAT SUGAR FREE 64)  30 mL Oral BID  . ipratropium-albuterol  3 mL Nebulization TID  . meropenem (MERREM) IV  1 g Intravenous Q24H  . methocarbamol (ROBAXIN)  IV  500 mg Intravenous Once  . metoprolol tartrate  50 mg Oral BID  . multivitamin  1 tablet Oral QHS  . predniSONE  10 mg Oral Q breakfast  . sevelamer carbonate  1,600 mg Oral TID WC  . sodium chloride  3 mL Intravenous Q12H  . sulfamethoxazole-trimethoprim  400 mg Intravenous Q24H  . warfarin  4 mg Oral ONCE-1800  . Warfarin - Pharmacist Dosing Inpatient   Does not apply q1800    Time spent on care of this patient: 35 mins   [facility] T , MD   [facility] Office  [phone] Pager - Text Page per Amion as per below:  On-Call/Text Page:      [email or web address]      password [email or web address]  If 7PM-7AM, please contact  night-coverage [email or web address] Password [email or web address] 06/24/2015, 11:00 AM   LOS: 17 days

## 2015-06-24 NOTE — Progress Notes (Addendum)
Pharmacy Antibiotic Follow-up Note  Terry [name] is a 81 y.o. year-old male admitted on 06/07/2015.  The patient is currently on day 7 of Merrem + Septra + Inhaled Colistin for CRE & Klebsiella Pneumonia (total antibiotic day 18). CXR is improving. WBC is improving. Patient remains afebrile.   Assessment/Plan: This patient's current antibiotics will be continued without adjustments.  Per Dr. Henreitta Leber ID consult sign off note, plan is to continue current antibiotic regimen through 1/6. Will add stop date.   Temp (24hrs), Avg:97.7 F (36.5 C), Min:97.2 F (36.2 C), Max:98.1 F (36.7 C)   Recent Labs Lab 06/19/15 0926 06/20/15 0329 06/22/15 0715 06/23/15 0910 06/24/15 0321  WBC 19.3* 18.0* 13.3* 18.6* 15.5*    Recent Labs Lab 06/19/15 0926 06/20/15 0910 06/22/15 0715 06/23/15 0910 06/24/15 0321  CREATININE 5.96* 4.08* 3.67* 5.00* 3.46*   Estimated Creatinine Clearance: 17.8 mL/min (by C-G formula based on Cr of 3.46).    Allergies  Allergen Reactions  . Yellow Dyes (Non-Tartrazine) Other (See Comments)    Burns arms Cough medications (tussionex and benzonatate ok)  . Levaquin [Levofloxacin In D5w] Itching    Antimicrobials this admission: Vanc 12/16 > 12/19 Cefepime 12/16 > 12/19 Fortaz12/19 >> 12/25 Flagyl 12/23 >> 12/25 Azactam 12/25 >> 12/27 Tygacil 12/25 >> 12/27 Colistin 12/27 >> (1/6) Septra 12/27 >> (1/6) Merrem 12/27 >> (1/6)  Levels/dose changes this admission: none  Microbiology results: 12/16 blood: ngF 12/17 blood: ngF 12/17 TA - Kleb oxytoca (sensitive Fortaz, Cipro, Primaxin, Septra) 12/21 resp - CRE (sensitive amikacin, Cipro, gent, Septra)  Thank you for allowing pharmacy to be a part of this patient's care.  Sloan Leiter, PharmD, BCPS Clinical Pharmacist [phone] 06/24/2015 7:38 AM

## 2015-06-24 NOTE — Progress Notes (Signed)
Order received for possible diet/liquid upgrade. Will see pt next date.     Orbie Pyo [employer] [phone]

## 2015-06-24 NOTE — Progress Notes (Signed)
Patient ID: Terry [name], male   DOB: 09/05/1934, 81 y.o.   MRN: [id number]  [facility] Progress Note   Assessment/ Plan:   1. Respiratory failure/COPD exacerbation from Healthcare associated pneumonia with MDR Enterobacter: Status post ventilator support/chest tube placement-both discontinued and currently on prednisone, bronchodilator nebulizers and broad-spectrum antibiotic therapy. Clinically doing better but remains deconditioned. 2.ESRD: Status post 2 hours of hemodialysis done yesterday for hyperkalemia however potassium today paradoxically higher-? Effect from Bactrim. Hemodialysis again today per his usual outpatient schedule-will attempt to do this in a recliner. 3. Anemia: Hemoglobin lower, without transfusion triggers at this time. We'll continue to monitor trend and transfuse when <8 g/dL 4. CKD-MBD: Hyperphosphatemia noted on previous labs-binders restarted. Reiterated importance of following a low phosphorus diet. 5. Nutrition: Low albumin level noted-secondary to acute/critical illness with inflammatory complex from infection. Continue renal diet/supplementation 6. Hypertension: Blood pressure is marginally elevated-continue to monitor on current therapy/ultrafiltration with hemodialysis  Subjective:   Reports to be feeling fair-states that he is hungry and needs to eat before he goes to dialysis.    Objective:   BP 132/80 mmHg  Pulse 96  Temp(Src) 98.1 F (36.7 C) (Oral)  Resp 20  Ht 5\' 7"  (1.702 m)  Wt 79.7 kg (175 lb 11.3 oz)  BMI 27.51 kg/m2  SpO2 97%  Physical Exam: Gen: Appears to be fidgety while resting in bed CVS: Pulse regular in rate and rhythm, S1 and S2 normal Resp: Coarse breath sounds bilaterally with intermittent expiratory wheeze Abd: Soft, obese, nontender Ext: Trace ankle edema  Labs: BMET  Recent Labs Lab 06/18/15 0503 06/19/15 0926 06/20/15 0910 06/22/15 0715 06/23/15 0910 06/24/15 0321  NA 137 135 139 136 132* 138  K  4.4 5.1 4.6 5.4* 6.1* 6.2*  CL 95* 95* 98* 97* 93* 100*  CO2 25 23 30 31 29 29   GLUCOSE 98 108* 96 102* 109* 118*  BUN 73* 120* 50* 27* 42* 26*  CREATININE 4.15* 5.96* 4.08* 3.67* 5.00* 3.46*  CALCIUM 8.1* 8.0* 7.9* 7.9* 8.1* 8.0*  PHOS  --  11.4*  --   --   --   --    CBC  Recent Labs Lab 06/19/15 0926 06/20/15 0329 06/22/15 0715 06/23/15 0910 06/24/15 0321  WBC 19.3* 18.0* 13.3* 18.6* 15.5*  NEUTROABS 16.9*  --  11.3*  --   --   HGB 8.9* 8.7* 8.3* 8.7* 8.0*  HCT 28.3* 28.8* 27.3* 28.3* 26.8*  MCV 97.3 101.1* 102.6* 100.0 103.1*  PLT 250 220 213 227 188    @IMGRELPRIORS @ Medications:    . antiseptic oral rinse  7 mL Mouth Rinse BID  . arformoterol  15 mcg Nebulization BID  . atorvastatin  20 mg Oral q1800  . budesonide (PULMICORT) nebulizer solution  0.5 mg Nebulization BID  . chlorhexidine gluconate  15 mL Mouth Rinse BID  . colistimethate  150 mg Inhalation BID  . darbepoetin (ARANESP) injection - DIALYSIS  200 mcg Intravenous Q Mon-HD  . docusate sodium  100 mg Oral BID  . doxercalciferol  2 mcg Intravenous Q M,W,F-HD  . feeding supplement (PRO-STAT SUGAR FREE 64)  30 mL Oral BID  . ipratropium-albuterol  3 mL Nebulization TID  . meropenem (MERREM) IV  1 g Intravenous Q24H  . methocarbamol (ROBAXIN)  IV  500 mg Intravenous Once  . metoprolol tartrate  50 mg Oral BID  . multivitamin  1 tablet Oral QHS  . predniSONE  10 mg Oral Q breakfast  . sevelamer  carbonate  1,600 mg Oral TID WC  . sodium chloride  3 mL Intravenous Q12H  . sulfamethoxazole-trimethoprim  400 mg Intravenous Q24H  . warfarin  4 mg Oral ONCE-1800  . Warfarin - Pharmacist Dosing Inpatient   Does not apply [id number]     Elmarie Shiley, MD 06/24/2015, 8:43 AM

## 2015-06-24 NOTE — Progress Notes (Signed)
Physical Therapy Treatment Patient Details Name: Terry [name] MRN: [id number] DOB: [birth year] Today's Date: 06/24/2015    History of Present Illness pt is a 81 y/o male with h/o stroke, HTN, COPD, CAD, afib and OA, admitted with 2 day h/o SOB with condition deteriorating in HD.12/21 on endotrachial tube, extubated 12/25. Chest tube removed 12/28.    PT Comments    Pt admitted with above diagnosis. Pt currently with functional limitations due to strength, balance and endurance deficits. Pt met 0/4 goals set 2 weeks ago due to decr participation as well as medical issues.    Revised goals today. Will continue to progress pt as able.  Pt will benefit from skilled PT to increase their independence and safety with mobility to allow discharge to the venue listed below.    Follow Up Recommendations  SNF;Supervision/Assistance - 24 hour     Equipment Recommendations  Other (comment) (TBD)    Recommendations for Other Services       Precautions / Restrictions Precautions Precautions: Fall Precaution Comments: monitor HR Restrictions Weight Bearing Restrictions: No    Mobility  Bed Mobility Overal bed mobility: Needs Assistance;+2 for physical assistance Bed Mobility: Supine to Sit     Supine to sit: Mod assist;HOB elevated     General bed mobility comments: took incr time and mod assist to come to EOB.  Pt took several attempts to push himself up from sidelyiing and ultimately needed assist.   Transfers Overall transfer level: Needs assistance Equipment used: Rolling walker (2 wheeled) Transfers: Sit to/from [facility] Sit to Stand: Mod assist;+2 physical assistance;From elevated surface Stand pivot transfers: Mod assist;+2 physical assistance       General transfer comment: Needed max cues for hand placement.  Pt needed assist with pad to bring bottom off of bed.  Once up, needed steadying assist to take pivotal steps around to chair.  Needed assist to  control descent into chair as well.   Ambulation/Gait             General Gait Details: refused to ambulate, only agreed to get to chair.    Stairs            Wheelchair Mobility    Modified Rankin (Stroke Patients Only)       Balance Overall balance assessment: Needs assistance;History of Falls Sitting-balance support: Bilateral upper extremity supported;Feet supported Sitting balance-Leahy Scale: Poor Sitting balance - Comments: sat EOB x 5 minutes with UEs propped on bed   Standing balance support: Bilateral upper extremity supported;During functional activity Standing balance-Leahy Scale: Poor Standing balance comment: relies on UE support for stand pivot transfer                    Cognition Arousal/Alertness: Awake/alert Behavior During Therapy: Flat affect Overall Cognitive Status: Impaired/Different from baseline Area of Impairment: Attention;Memory;Orientation Orientation Level: Disoriented to;Time Current Attention Level: Sustained Memory: Decreased short-term memory   Safety/Judgement: Decreased awareness of safety   Problem Solving: Requires verbal cues;Requires tactile cues General Comments: Pt needed max encouragement to participate as originally he was refusing    Exercises General Exercises - Lower Extremity Ankle Circles/Pumps: AROM;Both;10 reps;Seated Long Arc Quad: AROM;Both;5 reps;Seated    General Comments General comments (skin integrity, edema, etc.): Tried to encourage exercises 3x day.  Pt upset saying he can't do them right now.  Wife present and states she will work on them with pt later.        Pertinent Vitals/Pain Pain Assessment: Faces  Faces Pain Scale: Hurts whole lot Pain Location: buttock Pain Descriptors / Indicators: Aching;Grimacing Pain Intervention(s): Limited activity within patient's tolerance;Monitored during session;Premedicated before session;Repositioned  Pt needed 6LO2 to keep sats 91-92% with  transfer.  Pt desats if O2 any lower.  Other VSS.      Home Living                      Prior Function            PT Goals (current goals can now be found in the care plan section) Acute Rehab PT Goals PT Goal Formulation: With patient Time For Goal Achievement: 07/08/15 Potential to Achieve Goals: Fair Progress towards PT goals: Goals downgraded-see care plan    Frequency  Min 2X/week    PT Plan Current plan remains appropriate    Co-evaluation             End of Session Equipment Utilized During Treatment: Gait belt;Oxygen Activity Tolerance: Patient limited by fatigue Patient left: in chair;with call bell/phone within reach     Time: 1043-1100 PT Time Calculation (min) (ACUTE ONLY): 17 min  Charges:  $Therapeutic Activity: 8-22 mins                    G CodesIrwin Brakeman F 07-05-15, 3:26 PM Dannie Woolen,PT Acute Rehabilitation [phone] (pager)

## 2015-06-25 DIAGNOSIS — D62 Acute posthemorrhagic anemia: Secondary | ICD-10-CM | POA: Diagnosis not present

## 2015-06-25 DIAGNOSIS — J189 Pneumonia, unspecified organism: Secondary | ICD-10-CM | POA: Diagnosis not present

## 2015-06-25 DIAGNOSIS — I120 Hypertensive chronic kidney disease with stage 5 chronic kidney disease or end stage renal disease: Secondary | ICD-10-CM | POA: Diagnosis not present

## 2015-06-25 DIAGNOSIS — D638 Anemia in other chronic diseases classified elsewhere: Secondary | ICD-10-CM | POA: Diagnosis not present

## 2015-06-25 DIAGNOSIS — N186 End stage renal disease: Secondary | ICD-10-CM | POA: Diagnosis not present

## 2015-06-25 DIAGNOSIS — E1122 Type 2 diabetes mellitus with diabetic chronic kidney disease: Secondary | ICD-10-CM | POA: Diagnosis not present

## 2015-06-25 DIAGNOSIS — J449 Chronic obstructive pulmonary disease, unspecified: Secondary | ICD-10-CM | POA: Diagnosis not present

## 2015-06-25 DIAGNOSIS — I482 Chronic atrial fibrillation: Secondary | ICD-10-CM | POA: Diagnosis not present

## 2015-06-25 DIAGNOSIS — J939 Pneumothorax, unspecified: Secondary | ICD-10-CM

## 2015-06-25 DIAGNOSIS — I5043 Acute on chronic combined systolic (congestive) and diastolic (congestive) heart failure: Secondary | ICD-10-CM

## 2015-06-25 DIAGNOSIS — Z992 Dependence on renal dialysis: Secondary | ICD-10-CM | POA: Diagnosis not present

## 2015-06-25 DIAGNOSIS — I5033 Acute on chronic diastolic (congestive) heart failure: Secondary | ICD-10-CM | POA: Diagnosis present

## 2015-06-25 DIAGNOSIS — J9601 Acute respiratory failure with hypoxia: Secondary | ICD-10-CM | POA: Diagnosis not present

## 2015-06-25 DIAGNOSIS — D631 Anemia in chronic kidney disease: Secondary | ICD-10-CM | POA: Diagnosis not present

## 2015-06-25 DIAGNOSIS — N2581 Secondary hyperparathyroidism of renal origin: Secondary | ICD-10-CM | POA: Diagnosis not present

## 2015-06-25 DIAGNOSIS — I25700 Atherosclerosis of coronary artery bypass graft(s), unspecified, with unstable angina pectoris: Secondary | ICD-10-CM | POA: Diagnosis not present

## 2015-06-25 LAB — BASIC METABOLIC PANEL
Anion gap: 8 (ref 5–15)
BUN: 17 mg/dL (ref 6–20)
CHLORIDE: 97 mmol/L — AB (ref 101–111)
CO2: 29 mmol/L (ref 22–32)
CREATININE: 2.43 mg/dL — AB (ref 0.61–1.24)
Calcium: 7.8 mg/dL — ABNORMAL LOW (ref 8.9–10.3)
GFR calc Af Amer: 28 mL/min — ABNORMAL LOW (ref >60)
GFR calc non Af Amer: 24 mL/min — ABNORMAL LOW (ref >60)
GLUCOSE: 88 mg/dL (ref 65–99)
Potassium: 6 mmol/L — ABNORMAL HIGH (ref 3.5–5.1)
Sodium: 134 mmol/L — ABNORMAL LOW (ref 135–145)

## 2015-06-25 LAB — CBC
HEMATOCRIT: 26.9 % — AB (ref 39.0–52.0)
Hemoglobin: 8.1 g/dL — ABNORMAL LOW (ref 13.0–17.0)
MCH: 30.6 pg (ref 26.0–34.0)
MCHC: 30.1 g/dL (ref 30.0–36.0)
MCV: 101.5 fL — AB (ref 78.0–100.0)
Platelets: 153 10*3/uL (ref 150–400)
RBC: 2.65 MIL/uL — ABNORMAL LOW (ref 4.22–5.81)
RDW: 20.7 % — AB (ref 11.5–15.5)
WBC: 11.5 10*3/uL — ABNORMAL HIGH (ref 4.0–10.5)

## 2015-06-25 LAB — PROTIME-INR
INR: 1.22 (ref 0.00–1.49)
Prothrombin Time: 15.5 seconds — ABNORMAL HIGH (ref 11.6–15.2)

## 2015-06-25 LAB — POTASSIUM: Potassium: 4.8 mmol/L (ref 3.5–5.1)

## 2015-06-25 LAB — MAGNESIUM: Magnesium: 2 mg/dL (ref 1.7–2.4)

## 2015-06-25 MED ORDER — WARFARIN SODIUM 4 MG PO TABS
4.0000 mg | ORAL_TABLET | Freq: Once | ORAL | Status: AC
  Administered 2015-06-25: 4 mg via ORAL
  Filled 2015-06-25 (×2): qty 1

## 2015-06-25 MED ORDER — SODIUM POLYSTYRENE SULFONATE 15 GM/60ML PO SUSP
60.0000 g | Freq: Once | ORAL | Status: AC
  Administered 2015-06-25: 60 g via ORAL
  Filled 2015-06-25: qty 240

## 2015-06-25 MED ORDER — CIPROFLOXACIN IN D5W 400 MG/200ML IV SOLN
400.0000 mg | INTRAVENOUS | Status: AC
Start: 2015-06-25 — End: 2015-06-28
  Administered 2015-06-26 – 2015-06-28 (×4): 400 mg via INTRAVENOUS
  Filled 2015-06-25 (×4): qty 200

## 2015-06-25 NOTE — Progress Notes (Addendum)
Speech Language Pathology Treatment: Dysphagia  Patient Details Name: Terry [name] MRN: [id number] DOB: Jan 30, 1935 Today's Date: 06/25/2015 Time: [id number] SLP Time Calculation (min) (ACUTE ONLY): 19 min  Assessment / Plan / Recommendation Clinical Impression  Orders received to reassess for possible diet advancement. Pt much more alert today and weaned from ventimask to 6L [place]. Pt consumed soft solids and thin liquids with Mod verbal/visual cues needed for consistent and accurate use of chin tuck. Immediate cough observed x1 in setting of inadequate posture. SLP educated pt and wife to results of MBS and silent penetration. Given improvements in alertness and respirations with likely improved strength as well, pt would be appropriate for advancement to Dys 2 diet and thin liquids but will require full supervision to ensure proper use of chin tuck. Pt and wife agreeable.   HPI HPI: 81yo male with hx ESRD, AFib, COPD, CVA presented 12/16 to the ED with a 2 day history of progressive SOB and cough with yellow sputum. Had worsening respiratory status while at HD and EMS was called. Diagnosed with PNA vs COPD exacerbation vs CHF exacerbation. Intubated 12/16-12/18, then again 12/21-12/25.      SLP Plan  Continue with current plan of care     Recommendations  Diet recommendations: Dysphagia 2 (fine chop);Thin liquid Liquids provided via: Cup;No straw Medication Administration: Crushed with puree Supervision: Patient able to self feed;Full supervision/cueing for compensatory strategies Compensations: Slow rate;Small sips/bites;Chin tuck Postural Changes and/or Swallow Maneuvers: Seated upright 90 degrees              Oral Care Recommendations: Oral care BID Follow up Recommendations: Skilled Nursing facility Plan: Continue with current plan of care   Germain Osgood, M.A. CCC-SLP [phone]  Germain Osgood 06/25/2015, 10:29 AM

## 2015-06-25 NOTE — Progress Notes (Signed)
TEAM 1 - Stepdown/ICU TEAM Progress Note  Terry [name] [id number] DOB: [birth year] DOA: 06/07/2015 PCP: [name], DO  Admit HPI / Brief Narrative: 81 y.o. WM PMHx ESRD on HD just started on HD 2 wks, A-Fib on anticoagulation,Chronic Diastolic CHF,CAD native artery, COPD, Chronic Respiratory Failure Discharged on December 13,   Admitted for acute on chronic renal failure. He was deemed to be end-stage renal disease and was started on dialysis. After discharge, patient was feeling well, but he did have a cough. The cough got worse over the last 2 days. He has been coughing up yellowish expectoration. Denies any fever per se. His breathing has become more labored. He went for his dialysis on Wednesday, which he tolerated well. And then he was going for his dialysis today when he felt more short of breath. During dialysis because patient became acutely short of breath and was noted to have low oxygen saturations. Patient was subsequently transported over to the emergency department. He complains of some right-sided chest pain. No nausea, vomiting. No difficulty with swallowing or choking spell with swallowing. No dizziness or lightheadedness. After getting breathing treatments in the emergency department he is feeling better. He was found to have a temperature of 45F, he was tachycardic, his sats were in the 70s when he presented to the hospital. Chest x-ray revealed bibasilar opacities. He has slightly elevated WBC. Presentation is highly suggestive of pneumonia. Hospitalist was called for admission. Patient still requiring 100% nonrebreather, although it could be titrated down   HPI/Subjective: 1/3 A/O 4, on High Fllow nasal cannula. Negative CP. Negative hemoptysis overnight. States setup for at least and hour in chair. Appears much more comfortable.  Assessment/Plan: Acute hypoxemic respiratory failure -slowly improving  - extubated 12/25 tolerating high flow nasal cannula at  10 L /min. -Spoke with CSW [place] today concerning finding SNF which can accommodate hi Flow [place]  Very drug-resistant Enterobacter cloacae HCAP -Continue meropenem -DC Bactrim secondary to constant hyperkalemia -Start ciprofloxacin per pharmacy  Lt  PTX -12/28 Left Chest Tube per PCCM; -CXR stable   COPD w/ acute exacerbation  -Acute exacerbation appears to have resolved - -PFTs suggest very severe COPD, should he decompensate again from pulm status, re-intubation is highly likely to be required  -Patient continues to have difficulty weaning; Echocardiogram showed mild pulmonary hypertension  HTN  -Slightly elevated however patient's BP volatile, will not add additional medication    CAD s/p CABG -Asymptomatic at this time -Transfuse for hemoglobin<8  Chronic A-Fib on coumadin  -Rate controlled -Continue  Coumadin at low dose. Hemoptysis almost fully resolved, H/H stable  Chronic grade 1 Diastolic heart failure -No evidence of severe volume overload at this time - volume management per HD   ESRD on HD M/W/F  -HD per Nephrology   Hyperkalemia -Potassium goal>4 -Resolved  Anemia of chronic disease -Stable  -Transfuse for hemoglobin<8  Hx of CVA   Code Status: FULL Family Communication: None  Disposition Plan: Per ID    Consultants: Dr.Robert Schertz Nephrology Dr.Robert Ethel Rana ID Dr.Douglas B McQuaid PCCM  Procedure/Significant Events: 12/16 intubated, L tension ptx post intubation, chest tube placed in ER  extubated  12/31 echocardiogram;- Left ventricle: moderateconcentric hypertrophy. LVEF = 65% to 70%.  - Pulmonary arteries: PApeak pressure: 39 mm Hg (S).   Culture Urine 12/16>>> no UOP BC x 2 12/16>>> Sputum 12/16>>>klieb, int ceftriaxone   Antibiotics: vanc 12/16>>>12/19 Cefepime 12/16>>>12/19 ceftaz 12/19>>>stop 12/23 Meropenem 12/28>> Bactrim 12/28>> 1/3 Ciprofloxacin 1/3>>  DVT  prophylaxis: Coumadin   Devices   LINES / TUBES:  ETT 12/16>>>12/18     Continuous Infusions:   Objective: VITAL SIGNS: Temp: 98.1 F (36.7 C) (01/03 0827) Temp Source: Oral (01/03 0827) BP: 146/56 mmHg (01/03 0827) Pulse Rate: 130 (01/03 0827) SPO2; FIO2:   Intake/Output Summary (Last 24 hours) at 06/25/15 0848 Last data filed at 06/24/15 2254  Gross per 24 hour  Intake    525 ml  Output   2090 ml  Net  -1565 ml     Exam: General: A/O 4, continues to be frustrated with slow progress of improvement. Laying in bed on High Fllow nasal cannula Eyes: Negative headache, eye pain, double vision,negative scleral hemorrhage ENT: Negative Runny nose, negative ear pain, negative gingival bleeding, Neck:  Negative scars, masses, torticollis, lymphadenopathy, JVD Lungs: tachypnea, positive diffuse rhonchi with mild expiratory wheezes, negative crackles Cardiovascular: Irregular irregular rhythm and rate without murmur gallop or rub normal S1 and S2 Abdomen:negative abdominal pain, nondistended, positive soft, bowel sounds, no rebound, no ascites, no appreciable mass Extremities: No significant cyanosis, clubbing, or edema bilateral lower extremities Psychiatric:  Negative depression, negative anxiety, negative fatigue, negative mania  Neurologic:  Cranial nerves II through XII intact, tongue/uvula midline, all extremities muscle strength 5/5, sensation intact throughout, negative dysarthria, negative expressive aphasia, negative receptive aphasia.   Data Reviewed: Basic Metabolic Panel:  Recent Labs Lab 06/19/15 0926 06/20/15 0910 06/22/15 0715 06/23/15 0910 06/24/15 0321 06/24/15 1501 06/24/15 1753 06/25/15 0328  NA 135 139 136 132* 138 133* 135 134*  K 5.1 4.6 5.4* 6.1* 6.2* 6.3* 3.1* 6.0*  CL 95* 98* 97* 93* 100* 99* 94* 97*  CO2 23 30 31 29 29 29   --  29  GLUCOSE 108* 96 102* 109* 118* 123* 103* 88  BUN 120* 50* 27* 42* 26* 34* 6 17  CREATININE 5.96*  4.08* 3.67* 5.00* 3.46* 4.30* 1.10 2.43*  CALCIUM 8.0* 7.9* 7.9* 8.1* 8.0* 8.0*  --  7.8*  MG  --  2.1 2.0 2.3 2.2  --   --  2.0  PHOS 11.4*  --   --   --   --  4.5  --   --    Liver Function Tests:  Recent Labs Lab 06/19/15 0926 06/20/15 0910 06/22/15 0715 06/24/15 1501  AST  --  25 28  --   ALT  --  24 26  --   ALKPHOS  --  55 54  --   BILITOT  --  0.9 0.6  --   PROT  --  5.1* 4.8*  --   ALBUMIN 2.5* 2.3* 2.2* 2.3*   No results for input(s): LIPASE, AMYLASE in the last 168 hours. No results for input(s): AMMONIA in the last 168 hours. CBC:  Recent Labs Lab 06/19/15 0926 06/20/15 0329 06/22/15 0715 06/23/15 0910 06/24/15 0321 06/24/15 1753 06/25/15 0610  WBC 19.3* 18.0* 13.3* 18.6* 15.5*  --  11.5*  NEUTROABS 16.9*  --  11.3*  --   --   --   --   HGB 8.9* 8.7* 8.3* 8.7* 8.0* 9.2* 8.1*  HCT 28.3* 28.8* 27.3* 28.3* 26.8* 27.0* 26.9*  MCV 97.3 101.1* 102.6* 100.0 103.1*  --  101.5*  PLT 250 220 213 227 188  --  153   Cardiac Enzymes: No results for input(s): CKTOTAL, CKMB, CKMBINDEX, TROPONINI in the last 168 hours. BNP (last 3 results)  Recent Labs  05/13/15 2017 06/08/15 0016  BNP 362.1* 892.3*  ProBNP (last 3 results) No results for input(s): PROBNP in the last 8760 hours.  CBG:  Recent Labs Lab 06/18/15 2009  GLUCAP 91    No results found for this or any previous visit (from the past 240 hour(s)).   Studies:  Recent x-ray studies have been reviewed in detail by the Attending Physician  Scheduled Meds:  Scheduled Meds: . antiseptic oral rinse  7 mL Mouth Rinse BID  . arformoterol  15 mcg Nebulization BID  . atorvastatin  20 mg Oral q1800  . budesonide (PULMICORT) nebulizer solution  0.5 mg Nebulization BID  . chlorhexidine gluconate  15 mL Mouth Rinse BID  . colistimethate  150 mg Inhalation BID  . darbepoetin (ARANESP) injection - DIALYSIS  200 mcg Intravenous Q Mon-HD  . doxercalciferol  2 mcg Intravenous Q M,W,F-HD  . feeding  supplement (PRO-STAT SUGAR FREE 64)  30 mL Oral BID  . ipratropium-albuterol  3 mL Nebulization TID  . meropenem (MERREM) IV  1 g Intravenous Q24H  . methocarbamol (ROBAXIN)  IV  500 mg Intravenous Once  . metoprolol tartrate  50 mg Oral BID  . multivitamin  1 tablet Oral QHS  . sevelamer carbonate  1,600 mg Oral TID WC  . sodium chloride  3 mL Intravenous Q12H  . sulfamethoxazole-trimethoprim  400 mg Intravenous Q24H  . Warfarin - Pharmacist Dosing Inpatient   Does not apply q1800    Time spent on care of this patient: 40 mins   Stormey Wilborn, Geraldo Docker , MD  [facility] Office  [phone] Pager - [phone]  On-Call/Text Page:      [email or web address]      password [email or web address]  If 7PM-7AM, please contact night-coverage [email or web address] Password [email or web address] 06/25/2015, 8:48 AM   LOS: 18 days   Care during the described time interval was provided by me .  I have reviewed this patient's available data, including medical history, events of note, physical examination, and all test results as part of my evaluation. I have personally reviewed and interpreted all radiology studies.   Dia Crawford, MD [phone] Pager

## 2015-06-25 NOTE — Progress Notes (Signed)
Patient ID: Terry [name], male   DOB: [birth year], 81 y.o.   MRN: [id number]  [facility] Progress Note   Assessment/ Plan:   1. Respiratory failure/COPD exacerbation from Healthcare associated pneumonia with MDR Enterobacter: On broad-spectrum antibiotic coverage-discussed with Dr. Sherral Hammers to switch from Bactrim to alternate agents as I suspect that this is causing hyperkalemia. Will need physical therapy +/- inpatient rehabilitation/skilled nursing facility stay. 2.ESRD: Underwent hemodialysis yesterday without problems/events. Labs this morning indicate persistent hyperkalemia which I suspect is from his Bactrim-discuss with Dr. Sherral Hammers to stop his Bactrim and switch him over to ciprofloxacin based on sensitivity panel Enterobacter. He does not want to undertake additional dialysis today after being dialyzed 2 days in a row-we'll give him Kayexalate for hyperkalemia. 3. Anemia: Hemoglobin levels appear to be in flux-transfuse 1 less than 8 g/deciliter. No overt losses. Continue high-dose ESA. 4. CKD-MBD: Continue phosphorus binders and monitor trend, calcium levels acceptable. 5. Nutrition: Low albumin level noted-secondary to acute/critical illness with inflammatory complex from infection. Continue renal diet/supplementation 6. Hypertension: Blood pressure is marginally elevated-continue to monitor on current therapy/ultrafiltration with hemodialysis  Subjective:    Reports to be feeling well but expresses dissatisfaction with the food at the hospital (he is on a dysphagia 1 diet) and reports to be feeling very weak.    Objective:   BP 146/56 mmHg  Pulse 130  Temp(Src) 98.1 F (36.7 C) (Oral)  Resp 22  Ht 5\' 7"  (1.702 m)  Wt 81.3 kg (179 lb 3.7 oz)  BMI 28.07 kg/m2  SpO2 92%  Physical Exam: Gen: Appears to be comfortably resting in bed CVS: Pulse regular tachycardia, S1 and S2 normal Resp: Coarse breath sounds bilaterally with intermittent expiratory wheeze Abd: Soft,  obese, nontender Ext: No lower extremity edema noted  Labs: BMET  Recent Labs Lab 06/19/15 0926 06/20/15 0910 06/22/15 0715 06/23/15 0910 06/24/15 0321 06/24/15 1501 06/24/15 1753 06/25/15 0328  NA 135 139 136 132* 138 133* 135 134*  K 5.1 4.6 5.4* 6.1* 6.2* 6.3* 3.1* 6.0*  CL 95* 98* 97* 93* 100* 99* 94* 97*  CO2 23 30 31 29 29 29   --  29  GLUCOSE 108* 96 102* 109* 118* 123* 103* 88  BUN 120* 50* 27* 42* 26* 34* 6 17  CREATININE 5.96* 4.08* 3.67* 5.00* 3.46* 4.30* 1.10 2.43*  CALCIUM 8.0* 7.9* 7.9* 8.1* 8.0* 8.0*  --  7.8*  PHOS 11.4*  --   --   --   --  4.5  --   --    CBC  Recent Labs Lab 06/19/15 0926  06/22/15 0715 06/23/15 0910 06/24/15 0321 06/24/15 1753 06/25/15 0610  WBC 19.3*  < > 13.3* 18.6* 15.5*  --  11.5*  NEUTROABS 16.9*  --  11.3*  --   --   --   --   HGB 8.9*  < > 8.3* 8.7* 8.0* 9.2* 8.1*  HCT 28.3*  < > 27.3* 28.3* 26.8* 27.0* 26.9*  MCV 97.3  < > 102.6* 100.0 103.1*  --  101.5*  PLT 250  < > 213 227 188  --  153  < > = values in this interval not displayed.  Medications:    . antiseptic oral rinse  7 mL Mouth Rinse BID  . arformoterol  15 mcg Nebulization BID  . atorvastatin  20 mg Oral q1800  . budesonide (PULMICORT) nebulizer solution  0.5 mg Nebulization BID  . chlorhexidine gluconate  15 mL Mouth Rinse BID  .  colistimethate  150 mg Inhalation BID  . darbepoetin (ARANESP) injection - DIALYSIS  200 mcg Intravenous Q Mon-HD  . doxercalciferol  2 mcg Intravenous Q M,W,F-HD  . feeding supplement (PRO-STAT SUGAR FREE 64)  30 mL Oral BID  . ipratropium-albuterol  3 mL Nebulization TID  . meropenem (MERREM) IV  1 g Intravenous Q24H  . methocarbamol (ROBAXIN)  IV  500 mg Intravenous Once  . metoprolol tartrate  50 mg Oral BID  . multivitamin  1 tablet Oral QHS  . sevelamer carbonate  1,600 mg Oral TID WC  . sodium chloride  3 mL Intravenous Q12H  . sulfamethoxazole-trimethoprim  400 mg Intravenous Q24H  . Warfarin - Pharmacist Dosing  Inpatient   Does not apply [id number]   Elmarie Shiley, MD 06/25/2015, 8:50 AM

## 2015-06-25 NOTE — Progress Notes (Signed)
Occupational Therapy Treatment Patient Details Name: Terry [name] MRN: [id number] DOB: 03-05-1935 Today's Date: 06/25/2015    History of present illness pt is a 81 y/o male with h/o stroke, HTN, COPD, CAD, afib and OA, admitted with 2 day h/o SOB with condition deteriorating in HD.12/21 on endotrachial tube, extubated 12/25. Chest tube removed 12/28.   OT comments  This 81 yo male admitted with above presents to acute OT making progress from an OOB standpoint and will continue to benefit from acute OT with follow up OT at SNF to maximize functional independence with basic ADLs and mobility.   Follow Up Recommendations  SNF    Equipment Recommendations   (TBD next venue)       Precautions / Restrictions Precautions Precautions: Fall Precaution Comments: monitor HR Restrictions Weight Bearing Restrictions: No       Mobility Bed Mobility Overal bed mobility: Needs Assistance Bed Mobility: Sidelying to Sit   Sidelying to sit: Mod assist;HOB elevated (use of rail)          Transfers Overall transfer level: Needs assistance Equipment used: 2 person hand held assist Transfers: Stand Pivot Transfers   Stand pivot transfers: Mod assist;+2 physical assistance       General transfer comment: sat down rather quickly    Balance Overall balance assessment: Needs assistance Sitting-balance support: Feet supported;Bilateral upper extremity supported Sitting balance-Leahy Scale: Poor Sitting balance - Comments: sat EOB x 5 minutes with UEs propped on bed--but did move his arms from bed in prep to stand up   Standing balance support: Bilateral upper extremity supported;During functional activity Standing balance-Leahy Scale: Poor Standing balance comment: relies on support of UEs for any transfers                   ADL Overall ADL's : Needs assistance/impaired       Grooming Details (indicate cue type and reason): attempted to get pt to wash his face while seated  EOB--he agreed to washing his face, but then he said he couldn't that he was too weak--would not even try                 Toilet Transfer: Total assistance;+2 for physical assistance;Stand-pivot (bed>recliner going to pt's left)                              Cognition   Behavior During Therapy: Flat affect Overall Cognitive Status: Impaired/Different from baseline Area of Impairment: Attention;Problem solving   Current Attention Level: Sustained          Problem Solving: Requires verbal cues;Requires tactile cues;Difficulty sequencing General Comments: Pt needed less encouragement today over yesterday's PT session---this is after we agreed that I would help him with leg exercises before A'ing him up OOB to recliner      Exercises Other Exercises Other Exercises: Pt requested to do Bil LE exercises prior to OOB ( supine he did 10 reps of AROM hip flexion with resisted hip extension and 10 reps of AROM hip abduction with resisted hip adduction--Bilaterally)           Pertinent Vitals/ Pain       Pain Assessment: Faces Faces Pain Scale: Hurts even more Pain Location: bottom Pain Descriptors / Indicators: Grimacing;Sore Pain Intervention(s): Monitored during session;Repositioned (placed air cushion under him in recliner)         Frequency Min 2X/week     Progress Toward Goals  OT  Goals(current goals can now be found in the care plan section)  Progress towards OT goals:  (First time OOB with OT so progressing from this point and at the same time same A level as with PT yesterday for overall mobility)     Plan Discharge plan remains appropriate       End of Session Equipment Utilized During Treatment: Oxygen (6 liters)   Activity Tolerance Patient limited by fatigue   Patient Left in chair;with call bell/phone within reach;with chair alarm set   Nurse Communication Mobility status (+2 mod A stand pivot with use of gait belt, 10:30 now--pt to get back  to bed 11:30)        Time: 1010-1036 OT Time Calculation (min): 26 min  Charges: OT General Charges $OT Visit: 1 Procedure OT Treatments $Self Care/Home Management : 8-22 mins $Therapeutic Exercise: 8-22 mins  Almon Register [id number] 06/25/2015, 11:56 AM

## 2015-06-25 NOTE — Progress Notes (Signed)
ANTICOAGULATION CONSULT NOTE - FOLLOW UP  Pharmacy Consult:  Coumadin Indication: atrial fibrillation  Allergies  Allergen Reactions  . Yellow Dyes (Non-Tartrazine) Other (See Comments)    Burns arms Cough medications (tussionex and benzonatate ok)  . Levaquin [Levofloxacin In D5w] Itching    Patient Measurements: Height: 5\' 7"  (170.2 cm) Weight: 179 lb 3.7 oz (81.3 kg) IBW/kg (Calculated) : 66.1  Vital Signs: Temp: 98.1 F (36.7 C) (01/03 0827) Temp Source: Oral (01/03 0827) BP: 146/56 mmHg (01/03 0827) Pulse Rate: 130 (01/03 0827)  Labs:  Recent Labs  06/23/15 0507  06/23/15 0910 06/24/15 0321 06/24/15 1501 06/24/15 1753 06/25/15 0328 06/25/15 0610  HGB  --   < > 8.7* 8.0*  --  9.2*  --  8.1*  HCT  --   < > 28.3* 26.8*  --  27.0*  --  26.9*  PLT  --   --  227 188  --   --   --  153  LABPROT 15.1  --   --  15.7*  --   --  15.5*  --   INR 1.17  --   --  1.24  --   --  1.22  --   CREATININE  --   < > 5.00* 3.46* 4.30* 1.10 2.43*  --   < > = values in this interval not displayed.  Estimated Creatinine Clearance: 25.6 mL/min (by C-G formula based on Cr of 2.43).   Assessment: 78 YOM with history of Afib continued on Coumadin from PTA upon admit.  Anticoagulation held on 06/12/15 after finding of bleeding into lungs during bronch.  Chest tube removed on 06/19/15 and Coumadin resumed 06/20/15.    His INR continues to be low at 1.22 -after 4 mg x 2 doses.  Noted he is currently on Septra, which could increase patient's INR and only 50% intake of meals recorded.  Home Coumadin dose:  4mg  PO daily  Goal of Therapy:  INR 2-3    Plan:  - Coumadin 4mg  PO again today. Will follow closely to evaluate Septra's effect on Coumadin. - Daily PT / INR  Sloan Leiter, PharmD, BCPS Clinical Pharmacist [phone]  06/25/2015 9:28 AM

## 2015-06-25 NOTE — Progress Notes (Signed)
Pharmacy Antibiotic Follow-up Note  Assessment/Plan: NALU LABA is a 81 y.o. year-old male admitted on 06/07/2015.  The patient is currently on day #19 of abx for necrotizing Kleb + CRE PNA. Stopping Bactrim due to hyperkalemia and starting Cipro per pharmacy. Afebrile, WBC 11.5. SCr elevated at 2.43 but trending down. CrCl ~55ml/min.  Plan: Start ciprofloxacin 400mg  IV Q24 Continue colymycin-m 150mg  IN BID Continue merrem 1g IV Q24  Temp (24hrs), Avg:97.9 F (36.6 C), Min:97.5 F (36.4 C), Max:98.3 F (36.8 C)   Recent Labs Lab 06/20/15 0329 06/22/15 0715 06/23/15 0910 06/24/15 0321 06/25/15 0610  WBC 18.0* 13.3* 18.6* 15.5* 11.5*    Recent Labs Lab 06/23/15 0910 06/24/15 0321 06/24/15 1501 06/24/15 1753 06/25/15 0328  CREATININE 5.00* 3.46* 4.30* 1.10 2.43*   Estimated Creatinine Clearance: 25.6 mL/min (by C-G formula based on Cr of 2.43).    Allergies  Allergen Reactions  . Yellow Dyes (Non-Tartrazine) Other (See Comments)    Burns arms Cough medications (tussionex and benzonatate ok)  . Levaquin [Levofloxacin In D5w] Itching    Antimicrobials this admission: Vanc 12/16 > 12/19 Cefepime 12/16 > 12/19 Fortaz12/19 >> 12/25 Flagyl 12/23 >> 12/25 Azactam 12/25 >> 12/27 Tygacil 12/25 >> 12/27 Colistin 12/27 >> (1/6) Septra 12/27 >> (1/6) Merrem 12/27 >> (1/6)  Microbiology results: 12/16 blood: ngF 12/17 blood: ngF 12/17 TA - Kleb oxytoca (sensitive Fortaz, Cipro, Primaxin, Septra) 12/21 resp - CRE (sensitive amikacin, Cipro, gent, Septra)  Thank you for allowing pharmacy to be a part of this patient's care.  Elenor Quinones, PharmD, BCPS Clinical Pharmacist Pager [phone] 06/25/2015 9:32 PM

## 2015-06-26 DIAGNOSIS — I120 Hypertensive chronic kidney disease with stage 5 chronic kidney disease or end stage renal disease: Secondary | ICD-10-CM | POA: Diagnosis not present

## 2015-06-26 DIAGNOSIS — E1122 Type 2 diabetes mellitus with diabetic chronic kidney disease: Secondary | ICD-10-CM | POA: Diagnosis not present

## 2015-06-26 DIAGNOSIS — N2581 Secondary hyperparathyroidism of renal origin: Secondary | ICD-10-CM | POA: Diagnosis not present

## 2015-06-26 DIAGNOSIS — N186 End stage renal disease: Secondary | ICD-10-CM | POA: Diagnosis not present

## 2015-06-26 DIAGNOSIS — J189 Pneumonia, unspecified organism: Secondary | ICD-10-CM | POA: Diagnosis not present

## 2015-06-26 DIAGNOSIS — D631 Anemia in chronic kidney disease: Secondary | ICD-10-CM | POA: Diagnosis not present

## 2015-06-26 DIAGNOSIS — Z992 Dependence on renal dialysis: Secondary | ICD-10-CM | POA: Diagnosis not present

## 2015-06-26 LAB — CBC
HCT: 22 % — ABNORMAL LOW (ref 39.0–52.0)
HEMATOCRIT: 22.7 % — AB (ref 39.0–52.0)
HEMATOCRIT: 22.8 % — AB (ref 39.0–52.0)
HEMOGLOBIN: 7.3 g/dL — AB (ref 13.0–17.0)
HEMOGLOBIN: 7.7 g/dL — AB (ref 13.0–17.0)
Hemoglobin: 6.9 g/dL — CL (ref 13.0–17.0)
MCH: 30.8 pg (ref 26.0–34.0)
MCH: 29.9 pg (ref 26.0–34.0)
MCH: 30.7 pg (ref 26.0–34.0)
MCHC: 31.4 g/dL (ref 30.0–36.0)
MCHC: 32.2 g/dL (ref 30.0–36.0)
MCHC: 33.8 g/dL (ref 30.0–36.0)
MCV: 98.2 fL (ref 78.0–100.0)
MCV: 93 fL (ref 78.0–100.0)
MCV: 90.8 fL (ref 78.0–100.0)
PLATELETS: 153 10*3/uL (ref 150–400)
Platelets: 120 10*3/uL — ABNORMAL LOW (ref 150–400)
Platelets: 106 10*3/uL — ABNORMAL LOW (ref 150–400)
RBC: 2.24 MIL/uL — ABNORMAL LOW (ref 4.22–5.81)
RBC: 2.44 MIL/uL — AB (ref 4.22–5.81)
RBC: 2.51 MIL/uL — AB (ref 4.22–5.81)
RDW: 20.1 % — AB (ref 11.5–15.5)
RDW: 19.7 % — ABNORMAL HIGH (ref 11.5–15.5)
RDW: 19.1 % — ABNORMAL HIGH (ref 11.5–15.5)
WBC: 12 10*3/uL — ABNORMAL HIGH (ref 4.0–10.5)
WBC: 14.7 10*3/uL — ABNORMAL HIGH (ref 4.0–10.5)
WBC: 11.5 10*3/uL — ABNORMAL HIGH (ref 4.0–10.5)

## 2015-06-26 LAB — ABO/RH: ABO/RH(D): A POS

## 2015-06-26 LAB — BASIC METABOLIC PANEL
Anion gap: 10 (ref 5–15)
BUN: 48 mg/dL — AB (ref 6–20)
CO2: 29 mmol/L (ref 22–32)
CREATININE: 4.01 mg/dL — AB (ref 0.61–1.24)
Calcium: 7.6 mg/dL — ABNORMAL LOW (ref 8.9–10.3)
Chloride: 94 mmol/L — ABNORMAL LOW (ref 101–111)
GFR, EST AFRICAN AMERICAN: 15 mL/min — AB (ref >60)
GFR, EST NON AFRICAN AMERICAN: 13 mL/min — AB (ref >60)
Glucose, Bld: 118 mg/dL — ABNORMAL HIGH (ref 65–99)
POTASSIUM: 5.3 mmol/L — AB (ref 3.5–5.1)
SODIUM: 133 mmol/L — AB (ref 135–145)

## 2015-06-26 LAB — PROTIME-INR
INR: 1.74 — AB (ref 0.00–1.49)
INR: 2.47 — AB (ref 0.00–1.49)
INR: 1.69 — AB (ref 0.00–1.49)
PROTHROMBIN TIME: 26.5 s — AB (ref 11.6–15.2)
Prothrombin Time: 20.4 seconds — ABNORMAL HIGH (ref 11.6–15.2)
Prothrombin Time: 19.9 seconds — ABNORMAL HIGH (ref 11.6–15.2)

## 2015-06-26 LAB — PREPARE RBC (CROSSMATCH)

## 2015-06-26 LAB — MAGNESIUM: MAGNESIUM: 1.9 mg/dL (ref 1.7–2.4)

## 2015-06-26 MED ORDER — PANTOPRAZOLE SODIUM 40 MG IV SOLR
40.0000 mg | Freq: Two times a day (BID) | INTRAVENOUS | Status: DC
  Administered 2015-06-29 – 2015-06-30 (×2): 40 mg via INTRAVENOUS
  Filled 2015-06-26: qty 40

## 2015-06-26 MED ORDER — SODIUM CHLORIDE 0.9 % IV SOLN
8.0000 mg/h | INTRAVENOUS | Status: AC
  Administered 2015-06-26 – 2015-06-27 (×3): 8 mg/h via INTRAVENOUS
  Filled 2015-06-26 (×12): qty 80

## 2015-06-26 MED ORDER — FENTANYL CITRATE (PF) 100 MCG/2ML IJ SOLN
INTRAMUSCULAR | Status: AC
  Filled 2015-06-26: qty 2

## 2015-06-26 MED ORDER — SODIUM CHLORIDE 0.9 % IV SOLN: Freq: Once | INTRAVENOUS | Status: DC

## 2015-06-26 MED ORDER — DEXTROSE 5 % IV SOLN
10.0000 mg | Freq: Once | INTRAVENOUS | Status: AC
  Administered 2015-06-26: 10 mg via INTRAVENOUS
  Filled 2015-06-26: qty 1

## 2015-06-26 MED ORDER — SODIUM CHLORIDE 0.9 % IV SOLN
Freq: Once | INTRAVENOUS | Status: AC
  Administered 2015-06-26: 05:00:00 via INTRAVENOUS

## 2015-06-26 MED ORDER — NEPRO/CARBSTEADY PO LIQD
237.0000 mL | Freq: Two times a day (BID) | ORAL | Status: DC
  Administered 2015-06-28 – 2015-07-04 (×8): 237 mL via ORAL

## 2015-06-26 MED ORDER — SODIUM CHLORIDE 0.9 % IV SOLN
Freq: Once | INTRAVENOUS | Status: AC
Start: 2015-06-26 — End: 2015-06-26
  Administered 2015-06-26: 12:00:00 via INTRAVENOUS

## 2015-06-26 MED ORDER — SODIUM CHLORIDE 0.9 % IV SOLN
80.0000 mg | Freq: Once | INTRAVENOUS | Status: DC
  Filled 2015-06-26: qty 80

## 2015-06-26 MED ORDER — DOXERCALCIFEROL 4 MCG/2ML IV SOLN
INTRAVENOUS | Status: AC
  Filled 2015-06-26: qty 2

## 2015-06-26 NOTE — Progress Notes (Signed)
CSW met with pt and pt wife to confirm plan for SNF now that pt is closer to being ready for DC.    Wife at bedside and confirmed that they still want [facility]- [facility] has been following patient and plan to admit when pt is medically stable.  Per MD pt likely not ready to DC today but possible DC tomorrow 1/5 if pt is stable at that time  CSW will continue to follow  Domenica Reamer, [place] Social Worker [phone]

## 2015-06-26 NOTE — Procedures (Signed)
Patient seen on Hemodialysis. QB 350, UF goal 2L Treatment adjusted as needed.  Elmarie Shiley MD [facility] Office # [phone] Pager # [phone] 3:38 PM

## 2015-06-26 NOTE — Progress Notes (Addendum)
[place] TEAM 1 - Stepdown/ICU TEAM PROGRESS NOTE  Terry [name] [id number] DOB: 1935/02/05 DOA: 06/07/2015 PCP: [name], DO  Admit HPI / Brief Narrative: 81yo male with hx ESRD, AFib, COPD, CVA presented 12/16 to the ED via EMS with a 2 day history of progressive SOB and cough with yellow sputum. Had worsening respiratory status while at HD and EMS was called.   Significant Events: 12/16 intubated, L tension ptx post intubation, chest tube placed in ER  12/18 extubated 12/19 worsening distress, HD, bipap, small PTX resolved after chest tube back to suction 12/20 weak resp status remains, failed high flow nasal cannula O2 reservoir  12/21 required intubation, bronch w/ bloody clots, no DAH 12/25 extubated   HPI/Subjective: The patient appears somewhat anxious today.  He states he is having frequent loose stools.  He denies chest pain nausea vomiting or abdominal pain.  He states he feels very weak in general.  Addendum - following my visit the pt has a large BM which the RN informed me was maroon in color.    Assessment/Plan:  Acute hypoxemic respiratory failure Appears to be slowly improving - extubated 12/25 - tolerating [place] well on 4L presently    Very drug-resistant Enterobacter cloacae HCAP ID and Pharm directing abx selection/delivery - current abx to continue through 06/28/15  Anemia of chronic disease w/ acute blood loss anemia Hgb dipped by ~1G over 24hrs - to get 2U PRBC in HD today - follow trend   GIB Maroon stools noted per RN - d/c wafarin and dose w/ vitamin K and FFP - not stable enough from respiratory standpoint to tolerate EGD/Colo at this time - follow CBC closely - transfuse 2U PRBC today    L PTX - resolved CT removed per PCCM - f/u CXR stable   COPD w/ acute exacerbation  Acute exacerbation appears to have resolved - d/c'd steroid 06/24/15 - PCCM states his PFTs suggest very severe COPD - should he decompensate again from pulm status,  re-intubation is highly likely to be required   HTN  BP currently controlled   CAD s/p CABG Asymptomatic at this time  Chronic AFib on coumadin  Rate poorly controlled today - resumed coumadin per Rx at this time - adjust rate controlling meds as able - follow   Chronic grade 1 Diastolic heart failure No evidence of severe volume overload at this time - volume management per HD   ESRD on HD HD per Nephrology   Hyperkalemia HD today per Nephrology   Hx of CVA  Code Status: FULL Family Communication: no family present at time of exam today   Disposition Plan: SDU due to high potential to acutely decompensate   Consultants: Nephrology ID PCCM  Antibiotics: Per ID service   DVT prophylaxis: SCDs  Objective: Blood pressure 101/49, pulse 124, temperature 97.4 F (36.3 C), temperature source Oral, resp. rate 26, height 5\' 7"  (1.702 m), weight 81.6 kg (179 lb 14.3 oz), SpO2 95 %.  Intake/Output Summary (Last 24 hours) at 06/26/15 1039 Last data filed at 06/25/15 1600  Gross per 24 hour  Intake      0 ml  Output    250 ml  Net   -250 ml   Exam: General: some mild tachypnea - appears somewhat anxious  Lungs: Clear to auscultation bilaterally - no wheeze  Cardiovascular: tachycardic - no M or rub - irreg irreg   Abdomen: Nontender, nondistended, soft, bowel sounds positive, no rebound, no ascites, no appreciable  mass Extremities: No significant cyanosis, clubbing, or edema bilateral lower extremities  Data Reviewed: Basic Metabolic Panel:  Recent Labs Lab 06/22/15 0715 06/23/15 0910 06/24/15 0321 06/24/15 1501 06/24/15 1753 06/25/15 0328 06/25/15 0940 06/26/15 0233  NA 136 132* 138 133* 135 134*  --  133*  K 5.4* 6.1* 6.2* 6.3* 3.1* 6.0* 4.8 5.3*  CL 97* 93* 100* 99* 94* 97*  --  94*  CO2 31 29 29 29   --  29  --  29  GLUCOSE 102* 109* 118* 123* 103* 88  --  118*  BUN 27* 42* 26* 34* 6 17  --  48*  CREATININE 3.67* 5.00* 3.46* 4.30* 1.10 2.43*  --  4.01*   CALCIUM 7.9* 8.1* 8.0* 8.0*  --  7.8*  --  7.6*  MG 2.0 2.3 2.2  --   --  2.0  --  1.9  PHOS  --   --   --  4.5  --   --   --   --     CBC:  Recent Labs Lab 06/22/15 0715 06/23/15 0910 06/24/15 0321 06/24/15 1753 06/25/15 0610 06/26/15 0233  WBC 13.3* 18.6* 15.5*  --  11.5* 12.0*  NEUTROABS 11.3*  --   --   --   --   --   HGB 8.3* 8.7* 8.0* 9.2* 8.1* 6.9*  HCT 27.3* 28.3* 26.8* 27.0* 26.9* 22.0*  MCV 102.6* 100.0 103.1*  --  101.5* 98.2  PLT 213 227 188  --  153 153    Liver Function Tests:  Recent Labs Lab 06/20/15 0910 06/22/15 0715 06/24/15 1501  AST 25 28  --   ALT 24 26  --   ALKPHOS 55 54  --   BILITOT 0.9 0.6  --   PROT 5.1* 4.8*  --   ALBUMIN 2.3* 2.2* 2.3*   Coags:  Recent Labs Lab 06/22/15 0352 06/23/15 0507 06/24/15 0321 06/25/15 0328 06/26/15 0233  INR 1.09 1.17 1.24 1.22 1.74*   Studies:   Recent x-ray studies have been reviewed in detail by the Attending Physician  Scheduled Meds:  Scheduled Meds: . sodium chloride   Intravenous Once  . antiseptic oral rinse  7 mL Mouth Rinse BID  . arformoterol  15 mcg Nebulization BID  . atorvastatin  20 mg Oral q1800  . budesonide (PULMICORT) nebulizer solution  0.5 mg Nebulization BID  . chlorhexidine gluconate  15 mL Mouth Rinse BID  . ciprofloxacin  400 mg Intravenous Q24H  . colistimethate  150 mg Inhalation BID  . darbepoetin (ARANESP) injection - DIALYSIS  200 mcg Intravenous Q Mon-HD  . doxercalciferol  2 mcg Intravenous Q M,W,F-HD  . feeding supplement (NEPRO CARB STEADY)  237 mL Oral BID BM  . feeding supplement (PRO-STAT SUGAR FREE 64)  30 mL Oral BID  . ipratropium-albuterol  3 mL Nebulization TID  . meropenem (MERREM) IV  1 g Intravenous Q24H  . methocarbamol (ROBAXIN)  IV  500 mg Intravenous Once  . metoprolol tartrate  50 mg Oral BID  . multivitamin  1 tablet Oral QHS  . sevelamer carbonate  1,600 mg Oral TID WC  . sodium chloride  3 mL Intravenous Q12H  . Warfarin - Pharmacist  Dosing Inpatient   Does not apply q1800    Time spent on care of this patient: 35 mins   [facility] T , MD   [facility] Office  [phone] Pager - Text Page per Shea Evans as per below:  On-Call/Text Page:  [email or web address]      password [email or web address]  If 7PM-7AM, please contact night-coverage [email or web address] Password [email or web address] 06/26/2015, 10:39 AM   LOS: 19 days

## 2015-06-26 NOTE — Progress Notes (Signed)
Patient ID: Terry [name], male   DOB: Jun 14, 1935, 81 y.o.   MRN: [id number]  [facility] Progress Note   Assessment/ Plan:   1. Respiratory failure/COPD exacerbation from Healthcare associated pneumonia with MDR Enterobacter: Respiratory status improving per patient. Antibiotic therapy changed from Bactrim to ciprofloxacin as the former was felt to be potentiating hyperkalemia with some residual renal function. Will likely need inpatient rehabilitation/SNF prior to discharge given his overall deconditioning. 2.ESRD: Usually on a Monday/Wednesday/Friday schedule-planned for hemodialysis again today. 3. Anemia: Lower hemoglobin levels noted today (6.9) which is surprising because he does not have any overt loss-we'll transfuse 1 unit PRBCs at dialysis today. 4. CKD-MBD: Continue phosphorus binders and monitor trend, calcium levels acceptable. 5. Nutrition: Low albumin level noted-secondary to acute/critical illness with inflammatory complex from infection. Continue renal diet/supplementation 6. Hypertension: Blood pressure is marginally elevated-continue to monitor on current therapy/ultrafiltration with hemodialysis  Subjective:   Had some diarrhea overnight from [employer]-states that they attempted to ambulate him yesterday but he was "weak as water".    Objective:   BP 124/92 mmHg  Pulse 137  Temp(Src) 97.4 F (36.3 C) (Oral)  Resp 22  Ht 5\' 7"  (1.702 m)  Wt 81.6 kg (179 lb 14.3 oz)  BMI 28.17 kg/m2  SpO2 95%  Physical Exam: Gen: Comfortably resting in bed-not in any distress CVS: Pulse regular tachycardia, S1 and S2 normal Resp: Coarse breath sounds bilaterally with intermittent expiratory wheeze Abd: Soft, obese, nontender Ext: No lower extremity edema noted  Labs: BMET  Recent Labs Lab 06/19/15 0926 06/20/15 0910 06/22/15 0715 06/23/15 0910 06/24/15 0321 06/24/15 1501 06/24/15 1753 06/25/15 0328 06/25/15 0940 06/26/15 0233  NA 135 139 136 132* 138  133* 135 134*  --  133*  K 5.1 4.6 5.4* 6.1* 6.2* 6.3* 3.1* 6.0* 4.8 5.3*  CL 95* 98* 97* 93* 100* 99* 94* 97*  --  94*  CO2 23 30 31 29 29 29   --  29  --  29  GLUCOSE 108* 96 102* 109* 118* 123* 103* 88  --  118*  BUN 120* 50* 27* 42* 26* 34* 6 17  --  48*  CREATININE 5.96* 4.08* 3.67* 5.00* 3.46* 4.30* 1.10 2.43*  --  4.01*  CALCIUM 8.0* 7.9* 7.9* 8.1* 8.0* 8.0*  --  7.8*  --  7.6*  PHOS 11.4*  --   --   --   --  4.5  --   --   --   --    CBC  Recent Labs Lab 06/19/15 0926  06/22/15 0715 06/23/15 0910 06/24/15 0321 06/24/15 1753 06/25/15 0610 06/26/15 0233  WBC 19.3*  < > 13.3* 18.6* 15.5*  --  11.5* 12.0*  NEUTROABS 16.9*  --  11.3*  --   --   --   --   --   HGB 8.9*  < > 8.3* 8.7* 8.0* 9.2* 8.1* 6.9*  HCT 28.3*  < > 27.3* 28.3* 26.8* 27.0* 26.9* 22.0*  MCV 97.3  < > 102.6* 100.0 103.1*  --  101.5* 98.2  PLT 250  < > 213 227 188  --  153 153  < > = values in this interval not displayed.  Medications:    . antiseptic oral rinse  7 mL Mouth Rinse BID  . arformoterol  15 mcg Nebulization BID  . atorvastatin  20 mg Oral q1800  . budesonide (PULMICORT) nebulizer solution  0.5 mg Nebulization BID  . chlorhexidine gluconate  15 mL Mouth Rinse  BID  . ciprofloxacin  400 mg Intravenous Q24H  . colistimethate  150 mg Inhalation BID  . darbepoetin (ARANESP) injection - DIALYSIS  200 mcg Intravenous Q Mon-HD  . doxercalciferol  2 mcg Intravenous Q M,W,F-HD  . feeding supplement (PRO-STAT SUGAR FREE 64)  30 mL Oral BID  . ipratropium-albuterol  3 mL Nebulization TID  . meropenem (MERREM) IV  1 g Intravenous Q24H  . methocarbamol (ROBAXIN)  IV  500 mg Intravenous Once  . metoprolol tartrate  50 mg Oral BID  . multivitamin  1 tablet Oral QHS  . sevelamer carbonate  1,600 mg Oral TID WC  . sodium chloride  3 mL Intravenous Q12H  . Warfarin - Pharmacist Dosing Inpatient   Does not apply [id number]   Elmarie Shiley, MD 06/26/2015, 8:44 AM

## 2015-06-26 NOTE — Progress Notes (Signed)
SLP Cancellation Note  Patient Details Name: Terry [name] MRN: [id number] DOB: Apr 10, 1935   Cancelled treatment:        Pt receiving hygiene with nursing. Will continue efforts.   [name] 06/26/2015, 3:08 PM

## 2015-06-26 NOTE — Progress Notes (Addendum)
Nutrition Follow-up  DOCUMENTATION CODES:   Not applicable  INTERVENTION:   Nepro Shake po BID, each supplement provides 425 kcal and 19 grams protein   Continue Prostat liquid protein po 30 ml BID with meals, each supplement provides 100 kcal, 15 grams protein  NUTRITION DIAGNOSIS:   Inadequate oral intake now related to dysphagia, poor appetite as evidenced by  (meal completion </= 50%), ongoing  GOAL:   Patient will meet greater than or equal to 90% of their needs, progressing  MONITOR:   PO intake, Supplement acceptance, Labs, Weight trends, I & O's  ASSESSMENT:   81 y/o male PMhx Stroke, HTN, ESRD-newly on HD, CHF, COPD, CAD, Acute/chronic resp failure, PNA, A FIB who presents after having worsening SOB w/ cough. Was only able to tolerate 1 hr of HD yesterday. Transported from dialysis to ED and subsequently intubated for resp failure. Was hospitalized 11/21-12/13 w/ AoC RENAL failure and was started on HD at that time. This admission he is worked up for HCAP.   Patient sleeping upon RD visit.  Breakfast tray untouched on tray table.  Speech Path following.  Pt currently on Dys 2, thin liquid diet.  PO intake improved but still limited at 25-50% per flowsheet records.  Will adjust oral nutrition supplements now that pt can tolerate thin liquids.  Disposition: SNF placement.  Diet Order:  Dysphagia 2, thin liquids  Skin:  Reviewed, no issues  Last BM:  1/3  Height:   Ht Readings from Last 1 Encounters:  06/17/15 5\' 7"  (1.702 m)    Weight:   Wt Readings from Last 1 Encounters:  06/26/15 179 lb 14.3 oz (81.6 kg)    Ideal Body Weight:  72.73 kg  BMI:  Body mass index is 28.17 kg/(m^2).  Estimated Nutritional Needs:   Kcal:  1700-1900  Protein:  80-90 gm  Fluid:  1.7-1.9 L  EDUCATION NEEDS:   No education needs identified at this time  Arthur Holms, RD, LDN Pager #: [phone] After-Hours Pager #: [phone]

## 2015-06-26 NOTE — Progress Notes (Addendum)
CRITICAL VALUE ALERT  Critical value received:  Hgb 6.9  Date of notification:  06/26/2015  Time of notification:  0412  Critical value read back: Yes  Nurse who received alert:  Eulis Foster RN  MD notified (1st page):  Raliegh Ip Schorr  Time of first page:  [phone]  MD notified (2nd page):  Time of second page:  Responding MD: K.Schorr  Time MD responded: [id number]

## 2015-06-26 NOTE — Progress Notes (Signed)
Pt has had 5 loose maroon stools - no clots DR Thereasa Solo aware ordered 2 U FFP & 2 UPRBC's while in Dialysis

## 2015-06-27 DIAGNOSIS — J189 Pneumonia, unspecified organism: Secondary | ICD-10-CM | POA: Diagnosis not present

## 2015-06-27 DIAGNOSIS — E1122 Type 2 diabetes mellitus with diabetic chronic kidney disease: Secondary | ICD-10-CM | POA: Diagnosis not present

## 2015-06-27 DIAGNOSIS — Z992 Dependence on renal dialysis: Secondary | ICD-10-CM | POA: Diagnosis not present

## 2015-06-27 DIAGNOSIS — I482 Chronic atrial fibrillation: Secondary | ICD-10-CM | POA: Diagnosis not present

## 2015-06-27 DIAGNOSIS — D62 Acute posthemorrhagic anemia: Secondary | ICD-10-CM | POA: Diagnosis present

## 2015-06-27 DIAGNOSIS — I120 Hypertensive chronic kidney disease with stage 5 chronic kidney disease or end stage renal disease: Secondary | ICD-10-CM | POA: Diagnosis not present

## 2015-06-27 DIAGNOSIS — J449 Chronic obstructive pulmonary disease, unspecified: Secondary | ICD-10-CM | POA: Diagnosis not present

## 2015-06-27 DIAGNOSIS — D638 Anemia in other chronic diseases classified elsewhere: Secondary | ICD-10-CM | POA: Diagnosis not present

## 2015-06-27 DIAGNOSIS — N2581 Secondary hyperparathyroidism of renal origin: Secondary | ICD-10-CM | POA: Diagnosis not present

## 2015-06-27 DIAGNOSIS — N186 End stage renal disease: Secondary | ICD-10-CM | POA: Diagnosis not present

## 2015-06-27 DIAGNOSIS — D631 Anemia in chronic kidney disease: Secondary | ICD-10-CM | POA: Diagnosis not present

## 2015-06-27 DIAGNOSIS — K922 Gastrointestinal hemorrhage, unspecified: Secondary | ICD-10-CM | POA: Diagnosis present

## 2015-06-27 DIAGNOSIS — J9601 Acute respiratory failure with hypoxia: Secondary | ICD-10-CM | POA: Diagnosis not present

## 2015-06-27 DIAGNOSIS — I25700 Atherosclerosis of coronary artery bypass graft(s), unspecified, with unstable angina pectoris: Secondary | ICD-10-CM | POA: Diagnosis not present

## 2015-06-27 LAB — PREPARE FRESH FROZEN PLASMA
UNIT DIVISION: 0
Unit division: 0

## 2015-06-27 LAB — RENAL FUNCTION PANEL
Albumin: 2.1 g/dL — ABNORMAL LOW (ref 3.5–5.0)
Anion gap: 9 (ref 5–15)
BUN: 33 mg/dL — ABNORMAL HIGH (ref 6–20)
CHLORIDE: 102 mmol/L (ref 101–111)
CO2: 26 mmol/L (ref 22–32)
CREATININE: 2.65 mg/dL — AB (ref 0.61–1.24)
Calcium: 7.4 mg/dL — ABNORMAL LOW (ref 8.9–10.3)
GFR, EST AFRICAN AMERICAN: 25 mL/min — AB (ref >60)
GFR, EST NON AFRICAN AMERICAN: 22 mL/min — AB (ref >60)
Glucose, Bld: 78 mg/dL (ref 65–99)
Phosphorus: 3.5 mg/dL (ref 2.5–4.6)
Potassium: 4.3 mmol/L (ref 3.5–5.1)
SODIUM: 137 mmol/L (ref 135–145)

## 2015-06-27 LAB — PREPARE RBC (CROSSMATCH)

## 2015-06-27 LAB — CBC
HCT: 21.4 % — ABNORMAL LOW (ref 39.0–52.0)
HEMOGLOBIN: 7 g/dL — AB (ref 13.0–17.0)
MCH: 30 pg (ref 26.0–34.0)
MCHC: 32.7 g/dL (ref 30.0–36.0)
MCV: 91.8 fL (ref 78.0–100.0)
PLATELETS: 68 10*3/uL — AB (ref 150–400)
RBC: 2.33 MIL/uL — AB (ref 4.22–5.81)
RDW: 20.1 % — ABNORMAL HIGH (ref 11.5–15.5)
WBC: 11.9 10*3/uL — AB (ref 4.0–10.5)

## 2015-06-27 LAB — PROTIME-INR
INR: 1.61 — ABNORMAL HIGH (ref 0.00–1.49)
Prothrombin Time: 19.2 seconds — ABNORMAL HIGH (ref 11.6–15.2)

## 2015-06-27 MED ORDER — FENTANYL CITRATE (PF) 100 MCG/2ML IJ SOLN
25.0000 ug | INTRAMUSCULAR | Status: DC | PRN
  Administered 2015-06-27 – 2015-06-30 (×8): 50 ug via INTRAVENOUS
  Filled 2015-06-27 (×8): qty 2

## 2015-06-27 MED ORDER — SODIUM CHLORIDE 0.9 % IV SOLN: Freq: Once | INTRAVENOUS | Status: DC

## 2015-06-27 MED ORDER — VITAMIN K1 10 MG/ML IJ SOLN
2.0000 mg | Freq: Once | INTRAMUSCULAR | Status: AC
  Administered 2015-06-27: 2 mg via INTRAVENOUS
  Filled 2015-06-27: qty 0.2

## 2015-06-27 NOTE — Progress Notes (Signed)
Occupational Therapy Treatment Patient Details Name: Terry [name] MRN: [id number] DOB: [birth year] Today's Date: 06/27/2015    History of present illness pt is a 81 y/o male with h/o stroke, HTN, COPD, CAD, afib and OA, admitted with 2 day h/o SOB with condition deteriorating in HD.12/21 on endotrachial tube, extubated 12/25. Chest tube removed 12/28. 06/26/15 flexiseal placed   OT comments  This 81 yo male with above presents to acute OT only progress seen is his particpation with theraband UE exercises while supine in bed (he will do these when prompted and supervised). He is not progressing towards other goals due to his pain in buttocks--however he would not let me try to help him roll onto his side to try to relieve pressure on bottom. He says that he knows he needs therapy but that he is not ready for therapy due to his sore butt--I explained that one of the ways to help with the soreness is to get off of his back and onto his side, but he states this is too painful to do. I explained to him that for every day he lays in bed and does not try to get up the weaker and weaker he will get. Will continue to follow pt.  Follow Up Recommendations  SNF    Equipment Recommendations   (TBD next venue)       Precautions / Restrictions Precautions Precautions: Fall Precaution Comments: monitor HR Restrictions Weight Bearing Restrictions: No                              Cognition   Behavior During Therapy: Flat affect Overall Cognitive Status: Impaired/Different from baseline Area of Impairment: Problem solving              Problem Solving: Difficulty sequencing;Requires verbal cues;Requires tactile cues        Exercises Other Exercises Other Exercises: Supine did 10 reps of level one theraband Bil for shoulder flexion, extension, and horzontal add/abduction           Pertinent Vitals/ Pain       Pain Assessment: 0-10 Pain Score: 7  Pain Location: buttocks Pain  Descriptors / Indicators: Sore Pain Intervention(s): Monitored during session (asked pt to let me try and help him get repostioned and off of  his butt but he declined)         Frequency Min 2X/week     Progress Toward Goals  OT Goals(current goals can now be found in the care plan section)  Progress towards OT goals: Progressing toward goals (for UE exercises only--he is at a set/up-S level  which he will not progress past due to pt does not intiatie them on his own even with theraband in room)     Plan Discharge plan remains appropriate       End of Session Equipment Utilized During Treatment: Oxygen (6 liters)   Activity Tolerance Patient limited by pain (his report, but would only do UE exercises)   Patient Left  In bed with call bell and phone           Time: [id number] OT Time Calculation (min): 14 min  Charges: OT General Charges $OT Visit: 1 Procedure OT Treatments $Therapeutic Exercise: 8-22 mins  Almon Register [id number] 06/27/2015, 2:39 PM

## 2015-06-27 NOTE — Progress Notes (Addendum)
Physical Therapy Treatment Patient Details Name: Terry [name] MRN: [id number] DOB: Nov 07, 1934 Today's Date: 06/27/2015    History of Present Illness pt is a 81 y/o male with h/o stroke, HTN, COPD, CAD, afib and OA, admitted with 2 day h/o SOB with condition deteriorating in HD.12/21 on endotrachial tube, extubated 12/25. Chest tube removed 12/28.    PT Comments    Pt admitted with above diagnosis. Pt currently with functional limitations due to balance and endurance deficits as well as strength deficits.  Pt refusing to get OOB due to pain in buttocks.  Only agreed to exercises.  Will continue PT as pt tolerates and allows.  Pt will benefit from skilled PT to increase their independence and safety with mobility to allow discharge to the venue listed below.    Follow Up Recommendations  SNF;Supervision/Assistance - 24 hour     Equipment Recommendations  Other (comment) (TBD)    Recommendations for Other Services       Precautions / Restrictions Precautions Precautions: Fall Precaution Comments: monitor HR Restrictions Weight Bearing Restrictions: No    Mobility  Bed Mobility                  Transfers                    Ambulation/Gait                 Stairs            Wheelchair Mobility    Modified Rankin (Stroke Patients Only)       Balance                                    Cognition Arousal/Alertness: Awake/alert Behavior During Therapy: Flat affect Overall Cognitive Status: Impaired/Different from baseline Area of Impairment: Attention;Problem solving Orientation Level: Disoriented to;Time Current Attention Level: Sustained Memory: Decreased short-term memory   Safety/Judgement: Decreased awareness of safety   Problem Solving: Requires verbal cues;Requires tactile cues;Difficulty sequencing      Exercises General Exercises - Lower Extremity Ankle Circles/Pumps: AROM;Both;10 reps;Supine Quad Sets:  AROM;Both;10 reps;Supine Heel Slides: AROM;Both;10 reps Other Exercises Other Exercises: Pt refused to do more exercises due to it started hurting his buttocks.  Other Exercises: AROM shoulder flexion 10 x bilaterally    General Comments General comments (skin integrity, edema, etc.): continue to encourage pt to perform exercises 3xday.Tried to get pt to allow PT to prop him on his side for pressure relief however pt refuses.        Pertinent Vitals/Pain Pain Assessment: Faces Faces Pain Scale: Hurts whole lot Pain Location: buttocks Pain Descriptors / Indicators: Aching;Grimacing;Guarding;Sore Pain Intervention(s): Limited activity within patient's tolerance;Monitored during session;Repositioned  VSS    Home Living                      Prior Function            PT Goals (current goals can now be found in the care plan section) Progress towards PT goals: Not progressing toward goals - comment (buttocks excoriated and refuses to get OOB)    Frequency  Min 2X/week    PT Plan Current plan remains appropriate    Co-evaluation             End of Session Equipment Utilized During Treatment: Gait belt;Oxygen Activity Tolerance: Patient limited by fatigue Patient left: with  call bell/phone within reach;in bed     Time: [id number] PT Time Calculation (min) (ACUTE ONLY): 11 min  Charges:  $Therapeutic Exercise: 8-22 mins                    G Codes:      Irwin Brakeman F 06-28-15, 10:00 AM Cashe Gatt,PT Acute Rehabilitation [phone] (pager)

## 2015-06-27 NOTE — Progress Notes (Signed)
[place] TEAM 1 - Stepdown/ICU TEAM Progress Note  Terry [name] [id number] DOB: [birth year] DOA: 06/07/2015 PCP: [name], DO  Admit HPI / Brief Narrative: 81 y.o. WM PMHx ESRD on HD just started on HD 2 wks, A-Fib on anticoagulation,Chronic Diastolic CHF,CAD native artery, COPD, Chronic Respiratory Failure Discharged on December 13,   Admitted for acute on chronic renal failure. He was deemed to be end-stage renal disease and was started on dialysis. After discharge, patient was feeling well, but he did have a cough. The cough got worse over the last 2 days. He has been coughing up yellowish expectoration. Denies any fever per se. His breathing has become more labored. He went for his dialysis on Wednesday, which he tolerated well. And then he was going for his dialysis today when he felt more short of breath. During dialysis because patient became acutely short of breath and was noted to have low oxygen saturations. Patient was subsequently transported over to the emergency department. He complains of some right-sided chest pain. No nausea, vomiting. No difficulty with swallowing or choking spell with swallowing. No dizziness or lightheadedness. After getting breathing treatments in the emergency department he is feeling better. He was found to have a temperature of 71F, he was tachycardic, his sats were in the 70s when he presented to the hospital. Chest x-ray revealed bibasilar opacities. He has slightly elevated WBC. Presentation is highly suggestive of pneumonia. Hospitalist was called for admission. Patient still requiring 100% nonrebreather, although it could be titrated down   HPI/Subjective: 1/5  A/O 4, on High Fllow nasal cannula. Negative CP.positivehemoptysis overnight, positive melena. States behind raw secondary to constant stooling.   Assessment/Plan: Acute hypoxemic respiratory failure -slowly improving  - extubated 12/25 tolerating high flow nasal cannula at 10 L  /min. -1/3 Spoke with CSW [place] today concerning finding SNF which can accommodate hi Flow [place] -  Very drug-resistant Enterobacter cloacae HCAP -Continue meropenem -DC Bactrim secondary to constant hyperkalemia -Continue ciprofloxacin per pharmacy  Lt  PTX -12/28 Left Chest Tube per PCCM; -CXR stable   COPD w/ acute exacerbation  -Acute exacerbation appears to have resolved - -PFTs suggest very severe COPD, should he decompensate again from pulm status, re-intubation is highly likely to be required  -Patient continues to have difficulty weaning; Echocardiogram showed mild pulmonary hypertension  HTN  -Slightly elevated however patient's BP volatile, will not add additional medication    CAD s/p CABG -Asymptomatic at this time -Transfuse for hemoglobin<8  Chronic A-Fib on coumadin  -Rate controlled -Continue  Coumadin at low dose. Hemoptysis almost fully resolved, H/H stable  Chronic grade 1 Diastolic heart failure -No evidence of severe volume overload at this time - volume management per HD   ESRD on HD M/W/F  -HD per Nephrology   Hyperkalemia -Potassium goal>4 -Resolved  Acute blood loss anemia/Anemia of chronic disease/GI bleed -1/4 transfused 2 units FFP, 2 units PRBC -1/5 transfuse  1 unit FFP, 1 unit PRBC -vitamin K  2 mg IV -Transfuse for hemoglobin<8 -INR still elevated and hemoglobin trending down will attempt to normalize overnight. Contact GI in a.m. Believe patient's respiratory status improved enough for them to perform EGD/colonoscopy  Hx of CVA -Stable  Code Status: FULL Family Communication: None  Disposition Plan: Per ID    Consultants: Dr.Robert Schertz Nephrology Dr.Robert Ethel Rana ID Dr.Douglas B McQuaid PCCM  Procedure/Significant Events: 12/16 intubated, L tension ptx post intubation, chest tube placed in ER  extubated  12/31 echocardiogram;- Left ventricle:  moderateconcentric hypertrophy. LVEF = 65% to 70%.  - Pulmonary  arteries: PApeak pressure: 39 mm Hg (S). -1/4 transfused 2 units FFP, 2 units PRBC -1/5 transfuse  1 unit FFP, 1 unit PRBC,     Culture Urine 12/16>>> no UOP BC x 2 12/16>>> Sputum 12/16>>>klieb, int ceftriaxone   Antibiotics: vanc 12/16>>>12/19 Cefepime 12/16>>>12/19 ceftaz 12/19>>>stop 12/23 Meropenem 12/28>> Bactrim 12/28>> 1/3 Ciprofloxacin 1/3>>   DVT prophylaxis: Coumadin   Devices   LINES / TUBES:  ETT 12/16>>>12/18     Continuous Infusions: . pantoprozole (PROTONIX) infusion 8 mg/hr (06/27/15 1031)    Objective: VITAL SIGNS: Temp: 98.2 F (36.8 C) (01/05 1600) Temp Source: Oral (01/05 1600) BP: 90/52 mmHg (01/05 1600) Pulse Rate: 102 (01/05 1600) SPO2; FIO2:   Intake/Output Summary (Last 24 hours) at 06/27/15 1939 Last data filed at 06/26/15 2200  Gross per 24 hour  Intake 457.92 ml  Output      0 ml  Net 457.92 ml     Exam: General: A/O 4, frustrated with recurrent GI bleed, Laying in bed on High Fllow nasal cannula Eyes: Negative headache, eye pain, double vision,negative scleral hemorrhage ENT: Negative Runny nose, negative ear pain, positive gingival bleeding, Neck:  Negative scars, masses, torticollis, lymphadenopathy, JVD Lungs: tachypnea, positive diffuse rhonchi with mild expiratory wheezes, negative crackles Cardiovascular: Irregular irregular rhythm and rate without murmur gallop or rub normal S1 and S2 Abdomen: Positive mild abdominal pain to palpation, nondistended, positive soft, bowel sounds, no rebound, no ascites, no appreciable mass Extremities: No significant cyanosis, clubbing, or edema bilateral lower extremities Psychiatric:  Negative depression, negative anxiety, negative fatigue, negative mania  Neurologic:  Cranial nerves II through XII intact, tongue/uvula midline, all extremities muscle strength 5/5, sensation intact throughout, negative dysarthria, negative expressive aphasia, negative receptive  aphasia.   Data Reviewed: Basic Metabolic Panel:  Recent Labs Lab 06/22/15 0715 06/23/15 0910 06/24/15 0321 06/24/15 1501 06/24/15 1753 06/25/15 0328 06/25/15 0940 06/26/15 0233 06/27/15 0225  NA 136 132* 138 133* 135 134*  --  133* 137  K 5.4* 6.1* 6.2* 6.3* 3.1* 6.0* 4.8 5.3* 4.3  CL 97* 93* 100* 99* 94* 97*  --  94* 102  CO2 31 29 29 29   --  29  --  29 26  GLUCOSE 102* 109* 118* 123* 103* 88  --  118* 78  BUN 27* 42* 26* 34* 6 17  --  48* 33*  CREATININE 3.67* 5.00* 3.46* 4.30* 1.10 2.43*  --  4.01* 2.65*  CALCIUM 7.9* 8.1* 8.0* 8.0*  --  7.8*  --  7.6* 7.4*  MG 2.0 2.3 2.2  --   --  2.0  --  1.9  --   PHOS  --   --   --  4.5  --   --   --   --  3.5   Liver Function Tests:  Recent Labs Lab 06/22/15 0715 06/24/15 1501 06/27/15 0225  AST 28  --   --   ALT 26  --   --   ALKPHOS 54  --   --   BILITOT 0.6  --   --   PROT 4.8*  --   --   ALBUMIN 2.2* 2.3* 2.1*   No results for input(s): LIPASE, AMYLASE in the last 168 hours. No results for input(s): AMMONIA in the last 168 hours. CBC:  Recent Labs Lab 06/22/15 0715  06/25/15 0610 06/26/15 0233 06/26/15 1637 06/26/15 2136 06/27/15 0225  WBC 13.3*  < > 11.5*  12.0* 14.7* 11.5* 11.9*  NEUTROABS 11.3*  --   --   --   --   --   --   HGB 8.3*  < > 8.1* 6.9* 7.3* 7.7* 7.0*  HCT 27.3*  < > 26.9* 22.0* 22.7* 22.8* 21.4*  MCV 102.6*  < > 101.5* 98.2 93.0 90.8 91.8  PLT 213  < > 153 153 120* 106* 68*  < > = values in this interval not displayed. Cardiac Enzymes: No results for input(s): CKTOTAL, CKMB, CKMBINDEX, TROPONINI in the last 168 hours. BNP (last 3 results)  Recent Labs  05/13/15 2017 06/08/15 0016  BNP 362.1* 892.3*    ProBNP (last 3 results) No results for input(s): PROBNP in the last 8760 hours.  CBG: No results for input(s): GLUCAP in the last 168 hours.  No results found for this or any previous visit (from the past 240 hour(s)).   Studies:  Recent x-ray studies have been reviewed in  detail by the Attending Physician  Scheduled Meds:  Scheduled Meds: . sodium chloride   Intravenous Once  . sodium chloride   Intravenous Once  . sodium chloride   Intravenous Once  . antiseptic oral rinse  7 mL Mouth Rinse BID  . arformoterol  15 mcg Nebulization BID  . atorvastatin  20 mg Oral q1800  . budesonide (PULMICORT) nebulizer solution  0.5 mg Nebulization BID  . chlorhexidine gluconate  15 mL Mouth Rinse BID  . ciprofloxacin  400 mg Intravenous Q24H  . colistimethate  150 mg Inhalation BID  . darbepoetin (ARANESP) injection - DIALYSIS  200 mcg Intravenous Q Mon-HD  . doxercalciferol  2 mcg Intravenous Q M,W,F-HD  . feeding supplement (NEPRO CARB STEADY)  237 mL Oral BID BM  . feeding supplement (PRO-STAT SUGAR FREE 64)  30 mL Oral BID  . ipratropium-albuterol  3 mL Nebulization TID  . meropenem (MERREM) IV  1 g Intravenous Q24H  . methocarbamol (ROBAXIN)  IV  500 mg Intravenous Once  . metoprolol tartrate  50 mg Oral BID  . multivitamin  1 tablet Oral QHS  . pantoprazole (PROTONIX) IV  80 mg Intravenous Once  . [START ON 06/29/2015] pantoprazole (PROTONIX) IV  40 mg Intravenous Q12H  . sevelamer carbonate  1,600 mg Oral TID WC  . sodium chloride  3 mL Intravenous Q12H    Time spent on care of this patient: 40 mins   Khadeja Abt, Geraldo Docker , MD  [facility] Office  [phone] Pager - [phone]  On-Call/Text Page:      [email or web address]      password [email or web address]  If 7PM-7AM, please contact night-coverage [email or web address] Password [email or web address] 06/27/2015, 7:39 PM   LOS: 20 days   Care during the described time interval was provided by me .  I have reviewed this patient's available data, including medical history, events of note, physical examination, and all test results as part of my evaluation. I have personally reviewed and interpreted all radiology studies.   Dia Crawford, MD [phone] Pager

## 2015-06-27 NOTE — Progress Notes (Signed)
Pharmacy Antibiotic Follow-up Note  Assessment/Plan: Terry [name] is a 81 y.o. year-old male admitted on 06/07/2015.  The patient is currently on day #21 of abx for necrotizing Kleb + CRE PNA. Note fluoroquinolone allergy - discussed with MD and RN, monitoring for reaction. Antibiotics end tomorrow per ID. Stop dates in place. Labs stable.   Plan: Continue ciprofloxacin 400mg  IV Q24 Continue colymycin-m 150mg  IN BID Continue merrem 1g IV Q24 Pharmacy will sign off- please re-consult if needed.   Temp (24hrs), Avg:97.7 F (36.5 C), Min:97.1 F (36.2 C), Max:98.6 F (37 C)   Recent Labs Lab 06/25/15 0610 06/26/15 0233 06/26/15 1637 06/26/15 2136 06/27/15 0225  WBC 11.5* 12.0* 14.7* 11.5* 11.9*     Recent Labs Lab 06/24/15 1501 06/24/15 1753 06/25/15 0328 06/26/15 0233 06/27/15 0225  CREATININE 4.30* 1.10 2.43* 4.01* 2.65*   Estimated Creatinine Clearance: 23.6 mL/min (by C-G formula based on Cr of 2.65).    Allergies  Allergen Reactions  . Yellow Dyes (Non-Tartrazine) Other (See Comments)    Burns arms Cough medications (tussionex and benzonatate ok)  . Levaquin [Levofloxacin In D5w] Itching    Antimicrobials this admission: Vanc 12/16 > 12/19 Cefepime 12/16 > 12/19 Fortaz12/19 >> 12/25 Flagyl 12/23 >> 12/25 Azactam 12/25 >> 12/27 Tygacil 12/25 >> 12/27 Colistin 12/27 >> (1/6) Septra 12/27 >> 1/3 Merrem 12/27 >> (1/6) Cipro 1/3 >> (1/6)  Microbiology results: 12/16 blood: ngF 12/17 blood: ngF 12/17 TA - Kleb oxytoca (sensitive Fortaz, Cipro, Primaxin, Septra) 12/21 resp - CRE (sensitive amikacin, Cipro, gent, Septra)  Thank you for allowing pharmacy to be a part of this patient's care.  Sloan Leiter, PharmD, BCPS Clinical Pharmacist [phone]  06/27/2015 9:09 AM

## 2015-06-27 NOTE — Progress Notes (Signed)
Patient ID: Terry [name], male   DOB: [birth year], 81 y.o.   MRN: [id number]  [facility] Progress Note   Assessment/ Plan:   1. Respiratory failure/COPD exacerbation from Healthcare associated pneumonia with MDR Enterobacter: Patient reports waxing and waning of his respiratory status with intermittent shortness of breath-remains significantly deconditioned and anticipated discharge to skilled nursing facility when stable. 2.ESRD: Usually on a Monday/Wednesday/Friday schedule- will order for hemodialysis again tomorrow. 3. Anemia: Low hemoglobin levels persist after PRBC transfusion-recheck H&H and transfuse 2 additional units at dialysis tomorrow. 4. CKD-MBD: Calcium levels and phosphorus levels within acceptable limit-continue binders 5. Nutrition: Low albumin level noted-secondary to acute/critical illness with inflammatory complex from infection. Continue renal diet/supplementation 6. Hypertension: Soft blood pressures noted, not on any antihypertensive agents-continue to monitor with cautious ultrafiltration dialysis. 7. Atrial fibrillation: Previously on Coumadin that was stopped and anticoagulant reversed with vitamin K/FFP-INR this morning 1.6. Would favor restarting Eliquis when no further GI bleeding noted.  Subjective:   Hematochezia yesterday prompting transfusion of additional unit of PRBCs and FFP during dialysis. Had some tachyarrhythmias during dialysis-likely hemodynamic. Reports to be feeling fair this morning with rectal tube in place-no evident hematochezia since.    Objective:   BP 98/49 mmHg  Pulse 96  Temp(Src) 97.4 F (36.3 C) (Oral)  Resp 13  Ht 5\' 7"  (1.702 m)  Wt 82.6 kg (182 lb 1.6 oz)  BMI 28.51 kg/m2  SpO2 99%  Physical Exam: Gen: Comfortably resting in bed-not in any distress CVS: Pulse regular tachycardia, S1 and S2 normal Resp: Coarse breath sounds bilaterally with intermittent expiratory wheeze Abd: Soft, obese, nontender Ext: Trace  lower extremity edema noted  Labs: BMET  Recent Labs Lab 06/22/15 0715 06/23/15 0910 06/24/15 0321 06/24/15 1501 06/24/15 1753 06/25/15 0328 06/25/15 0940 06/26/15 0233 06/27/15 0225  NA 136 132* 138 133* 135 134*  --  133* 137  K 5.4* 6.1* 6.2* 6.3* 3.1* 6.0* 4.8 5.3* 4.3  CL 97* 93* 100* 99* 94* 97*  --  94* 102  CO2 31 29 29 29   --  29  --  29 26  GLUCOSE 102* 109* 118* 123* 103* 88  --  118* 78  BUN 27* 42* 26* 34* 6 17  --  48* 33*  CREATININE 3.67* 5.00* 3.46* 4.30* 1.10 2.43*  --  4.01* 2.65*  CALCIUM 7.9* 8.1* 8.0* 8.0*  --  7.8*  --  7.6* 7.4*  PHOS  --   --   --  4.5  --   --   --   --  3.5   CBC  Recent Labs Lab 06/22/15 0715  06/26/15 0233 06/26/15 1637 06/26/15 2136 06/27/15 0225  WBC 13.3*  < > 12.0* 14.7* 11.5* 11.9*  NEUTROABS 11.3*  --   --   --   --   --   HGB 8.3*  < > 6.9* 7.3* 7.7* 7.0*  HCT 27.3*  < > 22.0* 22.7* 22.8* 21.4*  MCV 102.6*  < > 98.2 93.0 90.8 91.8  PLT 213  < > 153 120* 106* 68*  < > = values in this interval not displayed.  Medications:    . sodium chloride   Intravenous Once  . antiseptic oral rinse  7 mL Mouth Rinse BID  . arformoterol  15 mcg Nebulization BID  . atorvastatin  20 mg Oral q1800  . budesonide (PULMICORT) nebulizer solution  0.5 mg Nebulization BID  . chlorhexidine gluconate  15 mL Mouth Rinse BID  .  ciprofloxacin  400 mg Intravenous Q24H  . colistimethate  150 mg Inhalation BID  . darbepoetin (ARANESP) injection - DIALYSIS  200 mcg Intravenous Q Mon-HD  . doxercalciferol  2 mcg Intravenous Q M,W,F-HD  . feeding supplement (NEPRO CARB STEADY)  237 mL Oral BID BM  . feeding supplement (PRO-STAT SUGAR FREE 64)  30 mL Oral BID  . ipratropium-albuterol  3 mL Nebulization TID  . meropenem (MERREM) IV  1 g Intravenous Q24H  . methocarbamol (ROBAXIN)  IV  500 mg Intravenous Once  . metoprolol tartrate  50 mg Oral BID  . multivitamin  1 tablet Oral QHS  . pantoprazole (PROTONIX) IV  80 mg Intravenous Once  .  [START ON 06/29/2015] pantoprazole (PROTONIX) IV  40 mg Intravenous Q12H  . sevelamer carbonate  1,600 mg Oral TID WC  . sodium chloride  3 mL Intravenous Q12H   Elmarie Shiley, MD 06/27/2015, 8:35 AM

## 2015-06-27 NOTE — Progress Notes (Signed)
Dr. Sloan Leiter made aware that patient is taking oral lactulose but refuses lactulose enema. Will attempt again later. If patient has no BM by 1900 we are to page Dr. Tera Helper and let him know. Urban Gibson Lexine Baton

## 2015-06-27 NOTE — Progress Notes (Signed)
SLP Cancellation Note  Patient Details Name: Terry [name] MRN: [id number] DOB: January 31, 1935   Cancelled treatment:        Pt politely declined therapy due to pain with rectal tube. Pt requested meds and RN notified. Will continue efforts.   [name] 06/27/2015, 2:59 PM

## 2015-06-28 ENCOUNTER — Encounter: Payer: Self-pay | Admitting: Vascular Surgery

## 2015-06-28 DIAGNOSIS — N186 End stage renal disease: Secondary | ICD-10-CM | POA: Diagnosis not present

## 2015-06-28 DIAGNOSIS — K922 Gastrointestinal hemorrhage, unspecified: Secondary | ICD-10-CM | POA: Diagnosis not present

## 2015-06-28 DIAGNOSIS — D62 Acute posthemorrhagic anemia: Secondary | ICD-10-CM

## 2015-06-28 DIAGNOSIS — E1122 Type 2 diabetes mellitus with diabetic chronic kidney disease: Secondary | ICD-10-CM | POA: Diagnosis not present

## 2015-06-28 DIAGNOSIS — I120 Hypertensive chronic kidney disease with stage 5 chronic kidney disease or end stage renal disease: Secondary | ICD-10-CM | POA: Diagnosis not present

## 2015-06-28 DIAGNOSIS — J189 Pneumonia, unspecified organism: Secondary | ICD-10-CM | POA: Diagnosis not present

## 2015-06-28 DIAGNOSIS — K921 Melena: Secondary | ICD-10-CM | POA: Diagnosis not present

## 2015-06-28 DIAGNOSIS — Z992 Dependence on renal dialysis: Secondary | ICD-10-CM | POA: Diagnosis not present

## 2015-06-28 DIAGNOSIS — D689 Coagulation defect, unspecified: Secondary | ICD-10-CM

## 2015-06-28 DIAGNOSIS — D631 Anemia in chronic kidney disease: Secondary | ICD-10-CM | POA: Diagnosis not present

## 2015-06-28 DIAGNOSIS — N2581 Secondary hyperparathyroidism of renal origin: Secondary | ICD-10-CM | POA: Diagnosis not present

## 2015-06-28 LAB — TYPE AND SCREEN
ABO/RH(D): A POS
Antibody Screen: NEGATIVE
Unit division: 0
Unit division: 0
Unit division: 0

## 2015-06-28 LAB — CBC
HEMATOCRIT: 25.7 % — AB (ref 39.0–52.0)
Hemoglobin: 8.3 g/dL — ABNORMAL LOW (ref 13.0–17.0)
MCH: 30.1 pg (ref 26.0–34.0)
MCHC: 32.3 g/dL (ref 30.0–36.0)
MCV: 93.1 fL (ref 78.0–100.0)
PLATELETS: 84 10*3/uL — AB (ref 150–400)
RBC: 2.76 MIL/uL — ABNORMAL LOW (ref 4.22–5.81)
RDW: 20.1 % — ABNORMAL HIGH (ref 11.5–15.5)
WBC: 9.8 10*3/uL (ref 4.0–10.5)

## 2015-06-28 LAB — PROTIME-INR
INR: 1.21 (ref 0.00–1.49)
PROTHROMBIN TIME: 15.4 s — AB (ref 11.6–15.2)

## 2015-06-28 MED ORDER — FENTANYL CITRATE (PF) 100 MCG/2ML IJ SOLN
INTRAMUSCULAR | Status: AC
  Filled 2015-06-28: qty 2

## 2015-06-28 MED ORDER — SEVELAMER CARBONATE 800 MG PO TABS
800.0000 mg | ORAL_TABLET | Freq: Three times a day (TID) | ORAL | Status: DC
  Administered 2015-06-28 – 2015-07-04 (×16): 800 mg via ORAL
  Filled 2015-06-28 (×13): qty 1

## 2015-06-28 MED ORDER — DOXERCALCIFEROL 4 MCG/2ML IV SOLN
INTRAVENOUS | Status: AC
  Administered 2015-06-28: 2 ug via INTRAVENOUS
  Filled 2015-06-28: qty 2

## 2015-06-28 MED ORDER — IPRATROPIUM-ALBUTEROL 0.5-2.5 (3) MG/3ML IN SOLN: 3.0000 mL | RESPIRATORY_TRACT | Status: DC | PRN

## 2015-06-28 NOTE — Progress Notes (Signed)
Wife called and spoke with our Secretary, Levada Dy, to make sure to take the phone charger with the patient.  Unable to locate phone charger.  I also informed the receiving nurse that I can not locate that the charger.

## 2015-06-28 NOTE — Progress Notes (Addendum)
Transfer note:  Arrival Method: Bed from HD; transfer from [facility] Mental Orientation: Telemetry: [place] notified. Verified Assessment: See flowsheet Skin: MASD to Buttocks IV: L F/A; L upper arm Pain: 5/10 pain to buttocks Tubes: Flexiseal Safety Measures: Bed in lowest positon, call light within reach, yellow socks placed, bed alarm activated. 6700 Orientation: Patient has been oriented to the unit, staff and to the room.  Orders have been reviewed and implemented. Will continue to assess and monitor pt.   Gavin Potters

## 2015-06-28 NOTE — Care Management Important Message (Signed)
Important Message  Patient Details  Name: KSEAN MCGLAUN MRN: [id number] Date of Birth: [birth year]   Medicare Important Message Given:  Yes    Lamona Eimer T, RN 06/28/2015, 1:39 PM

## 2015-06-28 NOTE — Consult Note (Signed)
[facility] Consult: 11:19 AM 06/28/2015  LOS: 21 days    Referring Provider: Dr Thereasa Solo  Primary Care Physician:  [name], DO Primary Gastroenterologist:  Dr. Ardis Hughs.      Reason for Consultation:  Black stools and acute blood loss anemia.    HPI: Terry [name] is a 81 y.o. male. [name] Hx late stage CKD morphed to ESRD/HD initiation 04/2015. COPD, gold stage 3.  DM type 2. Hx CVA. Anemia (receives ferric gluconate and Aranesp at HD). CAD (stent 1999). A fib. Coumadin replaced Abixiban during 04/2015 admission.  06/22/15 echo: LVEF 65-70%, insufficient study to determine diastolic dysfunction, and mild valvular regurge of MV, tricuspid, mild increase mildly increased.  Cysto/ureteroscopy stent placement 04/23/2012. S/p colon resection for "twisted bowel" in [place] in 2012.  Hx Diverticulosis, adenomatous colon polyps.   01/2011 Colonoscopy: Dr Renaldo Harrison at [facility] in [place], for pt hx colon polyps and abnormal CT.  MD removed  A 20 mm and 6 mm polyp from ascending colon, 10 mm polyp from rectum.  Severe descending and sigmoid tics with associated luminal narrowing 20 to 30 cm from anus. Path: all polyps adenomatous. Recommended to discuss sigmoid resection with a general surgeon.   On 03/2014, after chart review, Dr Ardis Hughs recommended/agreed with 3 year (01/2014) surveillance colonoscopy.    05/2014 Colonoscopy cancelled due to pt having started blood thinner for new A fib.  08/2014 OV with Dr Ardis Hughs.  Due to his lung and cardiac comorbidities, MD did not feel pt should undergo surveillance colonoscopy No EGD records in Epic.   05/13/15 - 06/04/15 admission with COPD flare, acute diastolic heart failure due to volume overload from renal failure, started HD that admission.  Discharged to home in  weakened condition.   Not on PPI at discharge or PTA  Admitted 06/07/15 with resp failure, drug resistant HCAP (sputum grew Klebsiella species 12/17 and  Enterobacter 12/21).  Required ETT/vent on 2 occasions (latest extubation 12/25), insertion chest tube (removed 12/28), bronch with suctioning of bloody clots.  Treated with steroids, abx.  Warfarin held, resumed 1/1 but again discontinued and reversed 1/4 due to UGIB (maroon then black stools).  Due to perirectal skin excoriation and freqquent loose stools, flexiseal placed yesterday PM. Has been hypotensive to 90s/40s at time with pulses to low 100s.  Currently 130s/40s, pulse in 90s.  Received PRBC x 3 and FFP x 3 between 06/26/15 -06/27/15.  Vitamin K totaling 12 mg IV 1/4 - 1/5.   Hgb nadir of 6.9 on 06/26/15. Today it is 8.3.  MCV initially in low 100s but now  93.  INR high of 2.4 on 1/4, today is 1.2.   Protonix IV once daily in place 12/17-12/219, stopped, resumed 12/22-12/26 then stopped. Protonix gtt started 1/4 and ongoing.   On no PPI PTA.        SLP bedside swallow eval of 06/11/15: revealed no s/s of aspiration of thins or purees, rec: basic aspiration precautions and D2 chopped, pudding thicks diet.  On 12/27 follow up diet regressed to  D 1, puree and nectar thicks due to pt fatigue, deconditioning.  Current diet is clear liquids with nepro supplement.    Pt not having nausea or abd pain.  The skin around rectum and rectum itself is painful.   Thinks he may have had EGD in past.  No hx ETOH use since his 39s.  No hx liver disease.  No NSAID use    Past Medical History  Diagnosis Date  . Stroke ([place]) 2012  . High cholesterol   . Hypertension   . Kidney disease     CKD4, sees [place] [facility], considering dialysis  . COPD (chronic obstructive pulmonary disease) ([place])   . CAD (coronary artery disease) [place], [name]  . Acute and chronic respiratory failure 10/22/2012  . Osteoarthritis 10/22/2012  . Pneumonia April 2014  .  Atrial fibrillation [facility])     Past Surgical History  Procedure Laterality Date  . Coronary stent placement  1999    2 stents  . Appendectomy  1984  . Colon surgery  2012    partial colon removed - twisted bowel  . Hernia repair      Right inguinal  . Cystoscopy/retrograde/ureteroscopy Bilateral 04/23/2014    Procedure: CYSTOSCOPY BILATERAL RETROGRADE,  LEFT URETEROSCOPY, LEFT STENT PLACEMENT;  Surgeon: Raynelle Bring, MD;  Location: [facility];  Service: Urology;  Laterality: Bilateral;  . Av fistula placement Right 05/21/2015    Procedure: Right Arm Brachiocephalic ARTERIOVENOUS (AV) FISTULA CREATION;  Surgeon: Angelia Mould, MD;  Location: [facility];  Service: Vascular;  Laterality: Right;    Prior to Admission medications   Medication Sig Start Date End Date Taking? Authorizing Provider  albuterol (VENTOLIN HFA) 108 (90 BASE) MCG/ACT inhaler Inhale 2 puffs into the lungs every 4 (four) hours as needed for wheezing or shortness of breath. 05/09/15  Yes [name], DO  amLODipine (NORVASC) 10 MG tablet Take 1 tablet (10 mg total) by mouth daily. 06/06/15  Yes Burnell Blanks, MD  atorvastatin (LIPITOR) 20 MG tablet Take 1 tablet (20 mg total) by mouth daily. Patient taking differently: Take 20 mg by mouth daily at 6 PM.  03/25/15  Yes Burnell Blanks, MD  benzonatate (TESSALON) 200 MG capsule Take 1 capsule (200 mg total) by mouth 3 (three) times daily as needed for cough. 06/04/15  Yes Ripudeep Krystal Eaton, MD  budesonide-formoterol (SYMBICORT) 160-4.5 MCG/ACT inhaler Inhale 2 puffs into the lungs 2 (two) times daily. 11/07/14  Yes Tanda Rockers, MD  clonazePAM (KLONOPIN) 1 MG tablet Take 1 tablet (1 mg total) by mouth daily as needed for anxiety (for HD treatment). 06/04/15  Yes Ripudeep Krystal Eaton, MD  guaiFENesin-dextromethorphan (ROBITUSSIN DM) 100-10 MG/5ML syrup Take 5 mLs by mouth every 4 (four) hours as needed for cough. 06/04/15  Yes Ripudeep Krystal Eaton, MD    HYDROcodone-acetaminophen (NORCO/VICODIN) 5-325 MG tablet Take 1 tablet by mouth every 6 (six) hours as needed for moderate pain or severe pain. 06/04/15  Yes Ripudeep K Rai, MD  ipratropium-albuterol (DUONEB) 0.5-2.5 (3) MG/3ML SOLN Take 3 mLs by nebulization 3 (three) times daily. 06/04/15  Yes Ripudeep Krystal Eaton, MD  magic mouthwash SOLN Take 5 mLs by mouth 4 (four) times daily. X 10days 06/04/15  Yes Ripudeep Krystal Eaton, MD  methocarbamol (ROBAXIN) 500 MG tablet Take 1 tablet (500 mg total) by mouth every 8 (eight) hours as needed for muscle spasms. 06/04/15  Yes Ripudeep Krystal Eaton, MD  metoprolol tartrate (LOPRESSOR) 25 MG tablet  Take 1 tablet (25 mg total) by mouth 2 (two) times daily. 06/04/15  Yes Ripudeep Krystal Eaton, MD  midodrine (PROAMATINE) 10 MG tablet Take 1 tablet (10 mg total) by mouth 3 (three) times daily with meals. 06/04/15  Yes Ripudeep Krystal Eaton, MD  polyethylene glycol (MIRALAX) packet Take 17 g by mouth daily as needed for moderate constipation. 06/04/15  Yes Ripudeep Krystal Eaton, MD  senna-docusate (SENOKOT S) 8.6-50 MG tablet Take 2 tablets by mouth at bedtime as needed for mild constipation. 06/04/15  Yes Ripudeep K Rai, MD  tiotropium (SPIRIVA HANDIHALER) 18 MCG inhalation capsule INHALE THE CONTENTS OF ONE CAPSULE INTO LUNGS ONCE DAILY Patient taking differently: Place 18 mcg into inhaler and inhale daily.  04/25/15  Yes Tanda Rockers, MD  warfarin (COUMADIN) 4 MG tablet Take 1 tablet (4 mg total) by mouth daily at 6 PM. Dose needs to be adjusted by your doctor 06/04/15  Yes Ripudeep Krystal Eaton, MD    Scheduled Meds: . antiseptic oral rinse  7 mL Mouth Rinse BID  . arformoterol  15 mcg Nebulization BID  . atorvastatin  20 mg Oral q1800  . budesonide (PULMICORT) nebulizer solution  0.5 mg Nebulization BID  . chlorhexidine gluconate  15 mL Mouth Rinse BID  . ciprofloxacin  400 mg Intravenous Q24H  . colistimethate  150 mg Inhalation BID  . darbepoetin (ARANESP) injection - DIALYSIS  200 mcg  Intravenous Q Mon-HD  . doxercalciferol  2 mcg Intravenous Q M,W,F-HD  . feeding supplement (NEPRO CARB STEADY)  237 mL Oral BID BM  . feeding supplement (PRO-STAT SUGAR FREE 64)  30 mL Oral BID  . meropenem (MERREM) IV  1 g Intravenous Q24H  . metoprolol tartrate  50 mg Oral BID  . multivitamin  1 tablet Oral QHS  . [START ON 06/29/2015] pantoprazole (PROTONIX) IV  40 mg Intravenous Q12H  . sevelamer carbonate  800 mg Oral TID WC  . sodium chloride  3 mL Intravenous Q12H   Infusions: . pantoprozole (PROTONIX) infusion 8 mg/hr (06/27/15 2129)   PRN Meds: acetaminophen, clonazePAM, docusate sodium, fentaNYL (SUBLIMAZE) injection, guaiFENesin, levalbuterol, methocarbamol, metoprolol, ondansetron, polyethylene glycol, RESOURCE THICKENUP CLEAR, zolpidem   Allergies as of 06/07/2015 - Review Complete 06/07/2015  Allergen Reaction Noted  . Yellow dyes (non-tartrazine) Other (See Comments) 04/22/2014  . Levaquin [levofloxacin in d5w] Itching 05/28/2014    Family History  Problem Relation Age of Onset  . Cancer Mother     Breast  . Heart disease Father   . Heart attack Father   . Parkinson's disease Brother     Social History   Social History  . Marital Status: Married    Spouse Name: N/A  . Number of Children: 3  . Years of Education: N/A   Occupational History  . retired-worked in radio    Social History Main Topics  . Smoking status: Former Smoker -- 1.00 packs/day for 49 years    Types: Cigarettes, Pipe, Cigars    Quit date: 02/21/2000  . Smokeless tobacco: Never Used  . Alcohol Use: No  . Drug Use: No  . Sexual Activity: Not on file   Other Topics Concern  . Not on file   Social History Narrative   Work or School: retired [employer] Situation: lives with wife in [place] regular; poor diet  REVIEW OF SYSTEMS: Constitutional:  Very weak, requiring assist and walker to  ambulate after discharge and brief stay at home.  Non ambulatory currently.  ENT:  No nose bleeds Pulm:  Denies cough and SOB CV:  No palpitations, no LE edema.  GU:  No hematuria, no frequency GI:  Per HPI Heme:  No issues with anemia prior to recent events.   Transfusions:  None in past per spousal recall.  Neuro:  No headaches, no peripheral tingling or numbness Derm:  No itching, no rash or sores.  Endocrine:  No sweats or chills.  No polyuria or dysuria Immunization:  Not queried.  Travel:  None beyond local counties in last few months.    PHYSICAL EXAM: Vital signs in last 24 hours: Filed Vitals:   06/28/15 0800 06/28/15 0957  BP: 130/47 130/47  Pulse: 90 96  Temp:    Resp: 21    Wt Readings from Last 3 Encounters:  06/28/15 83.5 kg (184 lb 1.4 oz)  06/01/15 87.3 kg (192 lb 7.4 oz)  05/01/15 93.895 kg (207 lb)    General: Ill appearing WM.  Appears his stated age. Uncomfortable. Head:  No swelling. No facial asymmetry.  Eyes:  No scleral icterus, no conjunctival pallor. EOMI. Ears:  Slightly HOH.  Nose:  No discharge or congestion Mouth:  Poor dentition, many missing and remainder display carries.  Gingival inflammation. Neck:  No masses. Lungs:  Clear bilaterally no breath sounds overall are decreased. No dyspnea. No cough. On the upper right chest, the HD catheter is seated. Heart:  Irregularly irregular.[name] No MRG. S1/S2 audible. Abdomen:  Bruising consistent with Lovenox injections on the right abdomen. Right abdomen somewhat tender without guarding or rebound. Bowel sounds hypoactive. No masses or organomegaly appreciated. No bruits..   Rectal: Flex ECL is in place and there is liquid black stool in the tubing with a greenish cast to the color.   Musc/Skeltl: No gross joint deformities or contractures. Extremities:  No CCE. Upper and lower extremity musculature wasting.  Neurologic:  Patient is alert, appropriate, oriented 3. Able to move all 4 limbs. No gross  tremors or asterixis appreciated. Skin:  Multiple purpura and bruising locations on the upper extremities and trunk. Some pitting on the skin of the thighs, nonpitting swelling in the upper extremities. Nodes:  No cervical adenopathy.   Psych:  Patient anxious and somewhat agitated but calms down quickly  Intake/Output from previous day: 01/05 0701 - 01/06 0700 In: 25 [I.V.:25] Out: -  Intake/Output this shift: Total I/O In: 53 [I.V.:53] Out: -   LAB RESULTS:  Recent Labs  06/26/15 2136 06/27/15 0225 06/28/15 0912  WBC 11.5* 11.9* 9.8  HGB 7.7* 7.0* 8.3*  HCT 22.8* 21.4* 25.7*  PLT 106* 68* 84*   BMET Lab Results  Component Value Date   NA 137 06/27/2015   NA 133* 06/26/2015   NA 134* 06/25/2015   K 4.3 06/27/2015   K 5.3* 06/26/2015   K 4.8 06/25/2015   CL 102 06/27/2015   CL 94* 06/26/2015   CL 97* 06/25/2015   CO2 26 06/27/2015   CO2 29 06/26/2015   CO2 29 06/25/2015   GLUCOSE 78 06/27/2015   GLUCOSE 118* 06/26/2015   GLUCOSE 88 06/25/2015   BUN 33* 06/27/2015   BUN 48* 06/26/2015   BUN 17 06/25/2015   CREATININE 2.65* 06/27/2015   CREATININE 4.01* 06/26/2015   CREATININE 2.43* 06/25/2015   CALCIUM 7.4* 06/27/2015   CALCIUM 7.6* 06/26/2015  CALCIUM 7.8* 06/25/2015   LFT  Recent Labs  06/27/15 0225  ALBUMIN 2.1*   PT/INR Lab Results  Component Value Date   INR 1.21 06/28/2015   INR 1.61* 06/27/2015   INR 1.69* 06/26/2015   Hepatitis Panel No results for input(s): HEPBSAG, HCVAB, HEPAIGM, HEPBIGM in the last 72 hours. C-Diff No components found for: CDIFF Lipase  No results found for: LIPASE  Drugs of Abuse  No results found for: LABOPIA, COCAINSCRNUR, LABBENZ, AMPHETMU, THCU, LABBARB   RADIOLOGY STUDIES: No results found.  ENDOSCOPIC STUDIES: Per HPI  IMPRESSION:   *  GI bleed. Suspect this is an upper GI bleed.  Hx adenomatous colon polyps and dense left sided diverticulosis. No PPI and place prior to admission but has been  receiving PPI for several days, currently covered with Protonix drip.  *  ABL on top of chronic anemia.  S/p PRBC x 3.  *  A fib, requiring chronic Coumadin, reversed and on hold due to GI bleeding.   *  History of late stage CKD, evolvid to HD requiring end-stage renal disease 04/2015.   *  Thrombocytopenia.     *  Drug resistant  HCAP requiring intubations, chest tube for PTX and bronch this admission.  Steroids and abx completed.   *  Debilitated.    PLAN:     *  Per Dr Henrene Pastor.    *  Stay with clear diet and nepro supplement for now.  However his albumin is low and he almost certainly has protein malnutrition so would like to be able to advance his diet as soon as safe to do so  *  Given increased stools and overall medical situation will check stool for c diff.    Azucena Freed  06/28/2015, 11:19 AM Pager: [phone]  GI ATTENDING  History, laboratories, x-rays reviewed. Patient personally seen and examined. Wife in room. Agree with extensive consultation note as outlined above. Extremely medically complicated patient as outlined. Transient upper GI bleed in the face of anticoagulation therapy. Bleeding seems to have stopped. Coags have been corrected. At this point I would continue to hold his anticoagulation. We have initiated PPI therapy which she should continue on. At this point I would forego upper endoscopy given his other medical issues. However, if he were to rebleed off anticoagulation then sooner endoscopy indicated. Long-term, he may need once look upper endoscopy if he is going to be a candidate for long-term anticoagulation. Continue to monitor stools and blood counts. Transfuse as needed. Discussed with patient and wife. We will follow. Thank you.  Docia Chuck. Geri Seminole., M.D. [facility] Division of Gastroenterology

## 2015-06-28 NOTE — Progress Notes (Signed)
SLP Cancellation Note  Patient Details Name: Terry [name] MRN: [id number] DOB: 07/18/1934   Cancelled treatment:        Pt in HD when attempted dysphagia tx   [name] 06/28/2015, 4:53 PM  Orbie Pyo Colvin Caroli.Ed [employer] [phone]

## 2015-06-28 NOTE — Procedures (Signed)
Pt seen on HD.  Ap  130 Vp 120.  BFR 400.  SBP 90's.  Had to turn off UF due to hypotension

## 2015-06-28 NOTE — Progress Notes (Signed)
S:CO flexiseal O:BP 130/47 mmHg  Pulse 90  Temp(Src) 97.8 F (36.6 C) (Oral)  Resp 21  Ht 5\' 7"  (1.702 m)  Wt 83.5 kg (184 lb 1.4 oz)  BMI 28.82 kg/m2  SpO2 93%  Intake/Output Summary (Last 24 hours) at 06/28/15 0954 Last data filed at 06/28/15 0900  Gross per 24 hour  Intake     75 ml  Output      0 ml  Net     75 ml   Weight change: 0.7 kg (1 lb 8.7 oz) [id number] and alert [id number], irreg Resp:Basilar crackles Abd:+ BS NTND Ext: 0-tr edema, RUA + bruit NEURO:CNI Ox3 no asterixis Rt PC   . antiseptic oral rinse  7 mL Mouth Rinse BID  . arformoterol  15 mcg Nebulization BID  . atorvastatin  20 mg Oral q1800  . budesonide (PULMICORT) nebulizer solution  0.5 mg Nebulization BID  . chlorhexidine gluconate  15 mL Mouth Rinse BID  . ciprofloxacin  400 mg Intravenous Q24H  . colistimethate  150 mg Inhalation BID  . darbepoetin (ARANESP) injection - DIALYSIS  200 mcg Intravenous Q Mon-HD  . doxercalciferol  2 mcg Intravenous Q M,W,F-HD  . feeding supplement (NEPRO CARB STEADY)  237 mL Oral BID BM  . feeding supplement (PRO-STAT SUGAR FREE 64)  30 mL Oral BID  . meropenem (MERREM) IV  1 g Intravenous Q24H  . methocarbamol (ROBAXIN)  IV  500 mg Intravenous Once  . metoprolol tartrate  50 mg Oral BID  . multivitamin  1 tablet Oral QHS  . pantoprazole (PROTONIX) IV  80 mg Intravenous Once  . [START ON 06/29/2015] pantoprazole (PROTONIX) IV  40 mg Intravenous Q12H  . sevelamer carbonate  1,600 mg Oral TID WC  . sodium chloride  3 mL Intravenous Q12H   No results found. BMET    Component Value Date/Time   NA 137 06/27/2015 0225   K 4.3 06/27/2015 0225   CL 102 06/27/2015 0225   CO2 26 06/27/2015 0225   GLUCOSE 78 06/27/2015 0225   BUN 33* 06/27/2015 0225   CREATININE 2.65* 06/27/2015 0225   CREATININE 2.62* 05/25/2014 1711   CALCIUM 7.4* 06/27/2015 0225   GFRNONAA 22* 06/27/2015 0225   GFRAA 25* 06/27/2015 0225   CBC    Component Value Date/Time   WBC 9.8  06/28/2015 0912   RBC 2.76* 06/28/2015 0912   HGB 8.3* 06/28/2015 0912   HCT 25.7* 06/28/2015 0912   PLT 84* 06/28/2015 0912   MCV 93.1 06/28/2015 0912   MCH 30.1 06/28/2015 0912   MCHC 32.3 06/28/2015 0912   RDW 20.1* 06/28/2015 0912   LYMPHSABS 1.3 06/22/2015 0715   MONOABS 0.4 06/22/2015 0715   EOSABS 0.3 06/22/2015 0715   BASOSABS 0.0 06/22/2015 0715     Assessment: 1. PNA 2. ESRD  [facility] 3. Anemia 3. A fib  4. GI bleed 5. Sec HPTH on hectorol 7. thrombocytopenia  Plan: 1. HD today 2. Decrease dose renagel   Emmaline Wahba T

## 2015-06-28 NOTE — Progress Notes (Signed)
Report given to Normal and Hemodialysis nurse is informed of this transfer and requested if she can call [facility] for report, just the hemodialysis part and transfer patient to room 6E 02.  Wife is aware of this transfer.

## 2015-06-28 NOTE — Progress Notes (Signed)
Patient's bottom is red and raw due to incontinence.  Skin barrier cream applied to bottom with Nystatin powder.  Flexiseal is intact and functional.

## 2015-06-28 NOTE — Progress Notes (Addendum)
[facility] TEAM 1 - Stepdown/ICU TEAM PROGRESS NOTE  Terry [name] [id number] DOB: [birth year] DOA: 06/07/2015 PCP: [name], DO  Admit HPI / Brief Narrative: 81yo male with hx ESRD, AFib, COPD, CVA presented 12/16 to the ED via EMS with a 2 day history of progressive SOB and cough with yellow sputum. Had worsening respiratory status while at HD and EMS was called.   Significant Events: 12/16 intubated, L tension ptx post intubation, chest tube placed in ER  12/18 extubated 12/19 worsening distress, HD, bipap, small PTX resolved after chest tube back to suction 12/20 weak resp status remains, failed high flow nasal cannula O2 reservoir  12/21 required intubation, bronch w/ bloody clots, no DAH 12/25 extubated  1/1 warfarin resumed  1/4 UGIB - transfused - warfarin reversed 1/6 Hgb stabilized - INR normalized    HPI/Subjective: The patient complains of pain related to his rectal tube but states he does not wish to have it removed because frequent stooling is even more painful per his report.  He denies chest pain shortness breath fevers chills nausea or vomiting.  He states he feels very weak in general.  He is willing to entertain the possibility of being discharged to a rehabilitation facility.  Assessment/Plan:  Acute hypoxemic respiratory failure Continues to make slow progress - extubated 12/25 - tolerating 4L [place] well presently    Very drug-resistant Enterobacter cloacae HCAP ID and Pharm directing abx selection/delivery - abx to continue through final doses today   Anemia of chronic disease w/ acute blood loss anemia Hgb nadir was 6.9 - appears to have stabilized at this time - f/u in AM   UGIB Maroon stools noted per RN - warfarin reduced w/ Vit K and 3U FFP - transfused 3U PRBC total - no evidence of ongoing bleeding at this time - will likely need EGD prior to resuming warfarin, but feel he is not stable enough presently from respiratory standpoint to tolerate  this - discussed w/ pt and wife, and they understand risk of holding coumain, but agree that risk of bleeding is presently higher - will consult GI as pt may be stable for exam in next 24-48hrs - pt tells me he has seen Dr. Ardis Hughs in the office, and requests [facility]   Chronic AFib on coumadin  Rate controlled - no coumadin presently due to GIB    L PTX - resolved CT removed per PCCM - f/u CXR stable   COPD w/ acute exacerbation  Acute exacerbation appears to have resolved - d/c'd steroid 06/24/15 - PCCM states his PFTs suggest very severe COPD   HTN  BP currently controlled   CAD s/p CABG Asymptomatic at this time  Chronic grade 1 Diastolic heart failure No evidence of severe volume overload at this time - volume management per HD   ESRD on HD HD per Nephrology on M/W/F schedule  Hyperkalemia Resolved w/ change from bactrim to cipro  Hx of CVA  Code Status: FULL Family Communication: spoke w/ wife at bedside at length    Disposition Plan: stable for transfer to renal tele bed - cont PT/OT - begin search for rehab bed - GI to see to advise on timing of EGD   Consultants: Nephrology ID PCCM [facility] - called 1/6  Antibiotics: Per ID service - to end today   DVT prophylaxis: SCDs  Objective: Blood pressure 130/47, pulse 96, temperature 97.8 F (36.6 C), temperature source Oral, resp. rate 21, height 5\' 7"  (1.702 m),  weight 83.5 kg (184 lb 1.4 oz), SpO2 93 %.  Intake/Output Summary (Last 24 hours) at 06/28/15 1032 Last data filed at 06/28/15 0959  Gross per 24 hour  Intake     78 ml  Output      0 ml  Net     78 ml   Exam: General: no acute resp distress - alert and conversant   Lungs: mild bibasilar crackles - no wheeze Cardiovascular: irreg irreg - rate controlled - no appreciable M or rub  Abdomen: nontender, nondistended, soft, bowel sounds positive, no rebound Extremities: no significant cyanosis, clubbing, edema bilateral lower extremities  Data  Reviewed: Basic Metabolic Panel:  Recent Labs Lab 06/22/15 0715 06/23/15 0910 06/24/15 0321 06/24/15 1501 06/24/15 1753 06/25/15 0328 06/25/15 0940 06/26/15 0233 06/27/15 0225  NA 136 132* 138 133* 135 134*  --  133* 137  K 5.4* 6.1* 6.2* 6.3* 3.1* 6.0* 4.8 5.3* 4.3  CL 97* 93* 100* 99* 94* 97*  --  94* 102  CO2 31 29 29 29   --  29  --  29 26  GLUCOSE 102* 109* 118* 123* 103* 88  --  118* 78  BUN 27* 42* 26* 34* 6 17  --  48* 33*  CREATININE 3.67* 5.00* 3.46* 4.30* 1.10 2.43*  --  4.01* 2.65*  CALCIUM 7.9* 8.1* 8.0* 8.0*  --  7.8*  --  7.6* 7.4*  MG 2.0 2.3 2.2  --   --  2.0  --  1.9  --   PHOS  --   --   --  4.5  --   --   --   --  3.5    CBC:  Recent Labs Lab 06/22/15 0715  06/26/15 0233 06/26/15 1637 06/26/15 2136 06/27/15 0225 06/28/15 0912  WBC 13.3*  < > 12.0* 14.7* 11.5* 11.9* 9.8  NEUTROABS 11.3*  --   --   --   --   --   --   HGB 8.3*  < > 6.9* 7.3* 7.7* 7.0* 8.3*  HCT 27.3*  < > 22.0* 22.7* 22.8* 21.4* 25.7*  MCV 102.6*  < > 98.2 93.0 90.8 91.8 93.1  PLT 213  < > 153 120* 106* 68* 84*  < > = values in this interval not displayed.  Liver Function Tests:  Recent Labs Lab 06/22/15 0715 06/24/15 1501 06/27/15 0225  AST 28  --   --   ALT 26  --   --   ALKPHOS 54  --   --   BILITOT 0.6  --   --   PROT 4.8*  --   --   ALBUMIN 2.2* 2.3* 2.1*   Coags:  Recent Labs Lab 06/26/15 0233 06/26/15 1637 06/26/15 2136 06/27/15 0225 06/28/15 0912  INR 1.74* 2.47* 1.69* 1.61* 1.21   Studies:   Recent x-ray studies have been reviewed in detail by the Attending Physician  Scheduled Meds:  Scheduled Meds: . antiseptic oral rinse  7 mL Mouth Rinse BID  . arformoterol  15 mcg Nebulization BID  . atorvastatin  20 mg Oral q1800  . budesonide (PULMICORT) nebulizer solution  0.5 mg Nebulization BID  . chlorhexidine gluconate  15 mL Mouth Rinse BID  . ciprofloxacin  400 mg Intravenous Q24H  . colistimethate  150 mg Inhalation BID  . darbepoetin (ARANESP)  injection - DIALYSIS  200 mcg Intravenous Q Mon-HD  . doxercalciferol  2 mcg Intravenous Q M,W,F-HD  . feeding supplement (NEPRO CARB STEADY)  237 mL  Oral BID BM  . feeding supplement (PRO-STAT SUGAR FREE 64)  30 mL Oral BID  . meropenem (MERREM) IV  1 g Intravenous Q24H  . methocarbamol (ROBAXIN)  IV  500 mg Intravenous Once  . metoprolol tartrate  50 mg Oral BID  . multivitamin  1 tablet Oral QHS  . pantoprazole (PROTONIX) IV  80 mg Intravenous Once  . [START ON 06/29/2015] pantoprazole (PROTONIX) IV  40 mg Intravenous Q12H  . sevelamer carbonate  800 mg Oral TID WC  . sodium chloride  3 mL Intravenous Q12H    Time spent on care of this patient: 35 mins   MCCLUNG,JEFFREY T , MD   [facility] Office  [phone] Pager - Text Page per Shea Evans as per below:  On-Call/Text Page:      [email or web address]      password [email or web address]  If 7PM-7AM, please contact night-coverage [email or web address] Password [email or web address] 06/28/2015, 10:32 AM   LOS: 21 days

## 2015-06-29 DIAGNOSIS — J9601 Acute respiratory failure with hypoxia: Secondary | ICD-10-CM | POA: Diagnosis not present

## 2015-06-29 DIAGNOSIS — Z9289 Personal history of other medical treatment: Secondary | ICD-10-CM | POA: Insufficient documentation

## 2015-06-29 DIAGNOSIS — Z992 Dependence on renal dialysis: Secondary | ICD-10-CM | POA: Diagnosis not present

## 2015-06-29 DIAGNOSIS — K921 Melena: Secondary | ICD-10-CM | POA: Diagnosis not present

## 2015-06-29 DIAGNOSIS — J189 Pneumonia, unspecified organism: Secondary | ICD-10-CM | POA: Diagnosis not present

## 2015-06-29 DIAGNOSIS — I482 Chronic atrial fibrillation: Secondary | ICD-10-CM | POA: Diagnosis not present

## 2015-06-29 DIAGNOSIS — N2581 Secondary hyperparathyroidism of renal origin: Secondary | ICD-10-CM | POA: Diagnosis not present

## 2015-06-29 DIAGNOSIS — I120 Hypertensive chronic kidney disease with stage 5 chronic kidney disease or end stage renal disease: Secondary | ICD-10-CM | POA: Diagnosis not present

## 2015-06-29 DIAGNOSIS — R197 Diarrhea, unspecified: Secondary | ICD-10-CM | POA: Diagnosis present

## 2015-06-29 DIAGNOSIS — K922 Gastrointestinal hemorrhage, unspecified: Secondary | ICD-10-CM | POA: Diagnosis not present

## 2015-06-29 DIAGNOSIS — T148XXA Other injury of unspecified body region, initial encounter: Secondary | ICD-10-CM

## 2015-06-29 DIAGNOSIS — N186 End stage renal disease: Secondary | ICD-10-CM | POA: Diagnosis not present

## 2015-06-29 DIAGNOSIS — E1122 Type 2 diabetes mellitus with diabetic chronic kidney disease: Secondary | ICD-10-CM | POA: Diagnosis not present

## 2015-06-29 DIAGNOSIS — D638 Anemia in other chronic diseases classified elsewhere: Secondary | ICD-10-CM | POA: Diagnosis not present

## 2015-06-29 DIAGNOSIS — D62 Acute posthemorrhagic anemia: Secondary | ICD-10-CM | POA: Diagnosis not present

## 2015-06-29 DIAGNOSIS — T148 Other injury of unspecified body region: Secondary | ICD-10-CM

## 2015-06-29 DIAGNOSIS — D689 Coagulation defect, unspecified: Secondary | ICD-10-CM | POA: Diagnosis not present

## 2015-06-29 DIAGNOSIS — D631 Anemia in chronic kidney disease: Secondary | ICD-10-CM | POA: Diagnosis not present

## 2015-06-29 DIAGNOSIS — I25700 Atherosclerosis of coronary artery bypass graft(s), unspecified, with unstable angina pectoris: Secondary | ICD-10-CM | POA: Diagnosis not present

## 2015-06-29 LAB — C DIFFICILE QUICK SCREEN W PCR REFLEX
C DIFFICLE (CDIFF) ANTIGEN: NEGATIVE
C Diff interpretation: NEGATIVE
C Diff toxin: NEGATIVE

## 2015-06-29 LAB — COMPREHENSIVE METABOLIC PANEL
ALT: 349 U/L — ABNORMAL HIGH (ref 17–63)
ANION GAP: 11 (ref 5–15)
AST: 206 U/L — AB (ref 15–41)
Albumin: 2.1 g/dL — ABNORMAL LOW (ref 3.5–5.0)
Alkaline Phosphatase: 59 U/L (ref 38–126)
BUN: 18 mg/dL (ref 6–20)
CHLORIDE: 104 mmol/L (ref 101–111)
CO2: 28 mmol/L (ref 22–32)
Calcium: 7.6 mg/dL — ABNORMAL LOW (ref 8.9–10.3)
Creatinine, Ser: 2.52 mg/dL — ABNORMAL HIGH (ref 0.61–1.24)
GFR, EST AFRICAN AMERICAN: 27 mL/min — AB (ref >60)
GFR, EST NON AFRICAN AMERICAN: 23 mL/min — AB (ref >60)
Glucose, Bld: 91 mg/dL (ref 65–99)
POTASSIUM: 3.4 mmol/L — AB (ref 3.5–5.1)
Sodium: 143 mmol/L (ref 135–145)
Total Bilirubin: 1.1 mg/dL (ref 0.3–1.2)
Total Protein: 4.5 g/dL — ABNORMAL LOW (ref 6.5–8.1)

## 2015-06-29 LAB — CBC
HCT: 25.2 % — ABNORMAL LOW (ref 39.0–52.0)
Hemoglobin: 8.2 g/dL — ABNORMAL LOW (ref 13.0–17.0)
MCH: 30.6 pg (ref 26.0–34.0)
MCHC: 32.5 g/dL (ref 30.0–36.0)
MCV: 94 fL (ref 78.0–100.0)
PLATELETS: 84 10*3/uL — AB (ref 150–400)
RBC: 2.68 MIL/uL — ABNORMAL LOW (ref 4.22–5.81)
RDW: 19.4 % — AB (ref 11.5–15.5)
WBC: 7 10*3/uL (ref 4.0–10.5)

## 2015-06-29 LAB — PROTIME-INR
INR: 1.19 (ref 0.00–1.49)
PROTHROMBIN TIME: 15.3 s — AB (ref 11.6–15.2)

## 2015-06-29 LAB — PREPARE FRESH FROZEN PLASMA: Unit division: 0

## 2015-06-29 NOTE — Progress Notes (Signed)
S:CO rectal pain from flexiseal.  Wants to eat  O:BP 120/43 mmHg  Pulse 95  Temp(Src) 99 F (37.2 C) (Oral)  Resp 21  Ht 5\' 7"  (1.702 m)  Wt 83.8 kg (184 lb 11.9 oz)  BMI 28.93 kg/m2  SpO2 87%  Intake/Output Summary (Last 24 hours) at 06/29/15 1049 Last data filed at 06/29/15 0559  Gross per 24 hour  Intake    860 ml  Output   1604 ml  Net   -744 ml   Weight change: 0.3 kg (10.6 oz) [id number] and alert [id number], irreg Resp:Basilar crackles Abd:+ BS NTND Ext: 0-tr edema, RUA + bruit NEURO:CNI Ox3 no asterixis Rt PC   . antiseptic oral rinse  7 mL Mouth Rinse BID  . arformoterol  15 mcg Nebulization BID  . atorvastatin  20 mg Oral q1800  . budesonide (PULMICORT) nebulizer solution  0.5 mg Nebulization BID  . chlorhexidine gluconate  15 mL Mouth Rinse BID  . darbepoetin (ARANESP) injection - DIALYSIS  200 mcg Intravenous Q Mon-HD  . doxercalciferol  2 mcg Intravenous Q M,W,F-HD  . feeding supplement (NEPRO CARB STEADY)  237 mL Oral BID BM  . feeding supplement (PRO-STAT SUGAR FREE 64)  30 mL Oral BID  . metoprolol tartrate  50 mg Oral BID  . multivitamin  1 tablet Oral QHS  . pantoprazole (PROTONIX) IV  40 mg Intravenous Q12H  . sevelamer carbonate  800 mg Oral TID WC  . sodium chloride  3 mL Intravenous Q12H   No results found. BMET    Component Value Date/Time   NA 143 06/29/2015 0549   K 3.4* 06/29/2015 0549   CL 104 06/29/2015 0549   CO2 28 06/29/2015 0549   GLUCOSE 91 06/29/2015 0549   BUN 18 06/29/2015 0549   CREATININE 2.52* 06/29/2015 0549   CREATININE 2.62* 05/25/2014 1711   CALCIUM 7.6* 06/29/2015 0549   GFRNONAA 23* 06/29/2015 0549   GFRAA 27* 06/29/2015 0549   CBC    Component Value Date/Time   WBC 7.0 06/29/2015 0549   RBC 2.68* 06/29/2015 0549   HGB 8.2* 06/29/2015 0549   HCT 25.2* 06/29/2015 0549   PLT 84* 06/29/2015 0549   MCV 94.0 06/29/2015 0549   MCH 30.6 06/29/2015 0549   MCHC 32.5 06/29/2015 0549   RDW 19.4* 06/29/2015 0549   LYMPHSABS 1.3 06/22/2015 0715   MONOABS 0.4 06/22/2015 0715   EOSABS 0.3 06/22/2015 0715   BASOSABS 0.0 06/22/2015 0715     Assessment: 1. PNA 2. ESRD  MWF [facility] 3. Anemia 3. A fib  4. GI bleed 5. Sec HPTH on hectorol 7. thrombocytopenia  Plan: 1. HD mon 2. Please do not give K supp 3. If no plans for GI procedure then he would like to eat   Chastidy Ranker T

## 2015-06-29 NOTE — Progress Notes (Signed)
Daily Rounding Note  06/29/2015, 11:09 AM  LOS: 22 days   SUBJECTIVE:        Stools still dark, runny.  Rectal tube in place and c/o the burning pain in rectal area.  No nausea, no abd pain Ate 60% of AM meal.     OBJECTIVE:         Vital signs in last 24 hours:    Temp:  [97.4 F (36.3 C)-99 F (37.2 C)] 99 F (37.2 C) (01/07 0525) Pulse Rate:  [77-105] 95 (01/07 0525) Resp:  [17-31] 21 (01/07 0525) BP: (78-141)/(40-90) 120/43 mmHg (01/07 0525) SpO2:  [87 %-100 %] 87 % (01/07 0912) Weight:  [83.8 kg (184 lb 11.9 oz)] 83.8 kg (184 lb 11.9 oz) (01/06 1329) Last BM Date: 06/29/15 Filed Weights   06/27/15 0435 06/28/15 0500 06/28/15 1329  Weight: 82.6 kg (182 lb 1.6 oz) 83.5 kg (184 lb 1.4 oz) 83.8 kg (184 lb 11.9 oz)   General: looks ill.    Heart: RRR Chest: clear bil in front, breathing not labored Abdomen: soft, tender on right in area of visible bruising.  BS hypoactive but not tympanitic or tinkling.   Extremities: no CCE Neuro/Psych:  Oriented x 3.  Affect depressed.   Intake/Output from previous day: 01/06 0701 - 01/07 0700 In: 913 [P.O.:560; I.V.:53; IV Piggyback:300] Out: [id number] [Urine:250]  Intake/Output this shift:    Lab Results:  Recent Labs  06/27/15 0225 06/28/15 0912 06/29/15 0549  WBC 11.9* 9.8 7.0  HGB 7.0* 8.3* 8.2*  HCT 21.4* 25.7* 25.2*  PLT 68* 84* 84*   BMET  Recent Labs  06/27/15 0225 06/29/15 0549  NA 137 143  K 4.3 3.4*  CL 102 104  CO2 26 28  GLUCOSE 78 91  BUN 33* 18  CREATININE 2.65* 2.52*  CALCIUM 7.4* 7.6*   LFT  Recent Labs  06/27/15 0225 06/29/15 0549  PROT  --  4.5*  ALBUMIN 2.1* 2.1*  AST  --  206*  ALT  --  349*  ALKPHOS  --  59  BILITOT  --  1.1   PT/INR  Recent Labs  06/28/15 0912 06/29/15 0549  LABPROT 15.4* 15.3*  INR 1.21 1.19   Hepatitis Panel No results for input(s): HEPBSAG, HCVAB, HEPAIGM, HEPBIGM in the last 72  hours.  Studies/Results: No results found.  ASSESMENT:   * GI bleed. Suspect upper GI source  Hx adenomatous colon polyps and dense left sided diverticulosis. No PPI and place prior to admission but has been receiving PPI for several days, currently covered with Protonix drip.  * ABL on top of chronic anemia. S/p PRBC x 3.  Hgb stable x 24 hours, improved overll.   * A fib, requiring chronic Coumadin, reversed and on hold due to GI bleeding.   * History of late stage CKD, evolved to HD requiring end-stage renal disease 04/2015.   * Thrombocytopenia.   * Drug resistant HCAP requiring intubations, chest tube for PTX and bronch this admission. Steroids and abx completed.   * Debilitated.    PLAN   *  EGD plans on hold, consider in future if pt to restart anti-coagulation but  prefer to give pt time to recover before sedating him.    *  Advance to full liquids.     Azucena Freed  06/29/2015, 11:09 AM Pager: [phone]  GI ATTENDING  Interval history data reviewed. Patient seen and examined. Wife in room. No  clinically significant GI bleeding since discontinuation of blood thinner. Hemoglobin stable. Now on PPI. Continue PPI therapy. Continue to monitor blood counts and stools. Advance diet as tolerated. We will progress today. Ongoing treatment of multiple acute and chronic medical problems.  Docia Chuck. Geri Seminole., M.D. [facility] Division of Gastroenterology

## 2015-06-29 NOTE — Progress Notes (Signed)
[facility] Progress Note  Patient: Terry [name] [id number]   PCP: Colin Benton R., DO DOB: 02/05/1935   DOA: 06/07/2015   DOS: 06/29/2015   Date of Service: the patient was seen and examined on 06/29/2015  Subjective: Patient complains of pain around the flexiseal tube. Denies any chest pain and abdominal pain nausea vomiting, or breathing. Nutrition: Was on clear liquid diet able to tolerate Activity: Mostly bedbound Last BM: Has rectal tube  Significant Events: 12/16 intubated, L tension ptx post intubation, chest tube placed in ER  12/18 extubated 12/19 worsening distress, HD, bipap, small PTX resolved after chest tube back to suction 12/20 weak resp status remains, failed high flow nasal cannula O2 reservoir  12/21 required intubation, bronch w/ bloody clots, no DAH 12/25 extubated  1/1 warfarin resumed  1/4 UGIB - transfused - warfarin reversed 1/6 Hgb stabilized - INR normalized   Assessment and Plan: 1. HCAP (healthcare-associated pneumonia)  Sepsis with acute hypoxic respiratory failure COPD Gold 3 with acute exacerbation Tension pneumothorax after intubation Chest tube placement and removal  Presented with cough with hypoxic respiratory failure requiring intubation, ET aspirate positive for multidrug resistant Klebsiella pneumoniae as well as Enterobacter Developed tension pneumothorax requiring chest tube.  At present chest tube has been removed, extubated, respirations status improved. 3 L nasal cannula currently. Completed antibiotic treatment per infectious disease on 06/28/2015. We will continue close monitoring on telemetry. Continue Brovana, Pulmicort nebulizer, Xopenex as needed and SLP evaluation  2. Upper GI bleed  Symptomatic anemia with acute blood loss  Hemoccult-positive, patient was on warfarin for atrial fibrillation, warfarin has been reversed, Status post 3 PRBC. GI consulted, currently conservative management with holding anticoagulation  and monitoring H&H. Continue PPI  3. Chronic A. Fib, Rate is controlled, no Coumadin due to GI bleed.  4. Essential hypertension Blood pressure well controlled, continue metoprolol. Holding amlodipine.  5. ESRD on hemodialysis MWF Electrolyte management Chronic diastolic dysfunction Continue scheduled hemodialysis per nephrology. Appreciate input.  6. Diarrhea. Continue rectal tube, as diarrhea is causing extensive skin excoriation. Wound care consulted. C. difficile negative. Add probiotics.  DVT Prophylaxis: mechanical compression device. Nutrition: Advance to full liquid to renal diet Advance goals of care discussion: Full code  Brief Summary of Hospitalization:  HPI: As per the H and P dictated on admission, "patient presented from hemodialysis with complaints of cough with expectoration as well as shortness of breath. This was ongoing 2 days prior to admission, while waiting in the ER he had rapid worsening of his respiratory status requiring BiPAP followed by intubation" Procedures: Hemodialysis Echocardiogram 06/22/2015, ejection fraction [id number], diastolic dysfunction, Chest tube removal 06/19/2015 Consultants: Critical care, nephrology, gastroenterology, infectious disease.  Antibiotics: Anti-infectives    Start     Dose/Rate Route Frequency Ordered Stop   06/25/15 2200  ciprofloxacin (CIPRO) IVPB 400 mg     400 mg 200 mL/hr over 60 Minutes Intravenous Every 24 hours 06/25/15 2124 06/28/15 2345   06/19/15 1800  meropenem (MERREM) 1 g in sodium chloride 0.9 % 100 mL IVPB    Comments:  Give after HD on dialysis days   1 g 200 mL/hr over 30 Minutes Intravenous Every 24 hours 06/18/15 1506 06/28/15 2130   06/19/15 1600  sulfamethoxazole-trimethoprim (BACTRIM) 400 mg in dextrose 5 % 500 mL IVPB  Status:  Discontinued     400 mg 350 mL/hr over 90 Minutes Intravenous Every 24 hours 06/18/15 1506 06/25/15 2119   06/18/15 1700  colistimethate ((COLYMYCIN-M)) nebulized  solution 150  mg     150 mg Inhalation 2 times daily 06/18/15 1617 06/28/15 2359   06/18/15 1600  meropenem (MERREM) 1 g in sodium chloride 0.9 % 100 mL IVPB     1 g 200 mL/hr over 30 Minutes Intravenous NOW 06/18/15 1506 06/18/15 1700   06/18/15 1600  sulfamethoxazole-trimethoprim (BACTRIM) 400 mg in dextrose 5 % 500 mL IVPB    Comments:  Give after HD on dialysis days   400 mg 350 mL/hr over 90 Minutes Intravenous  Once 06/18/15 1506 06/18/15 1800   06/18/15 1600  colistimethate ((COLYMYCIN-M)) nebulized solution 75 mg  Status:  Discontinued     75 mg Inhalation 2 times daily 06/18/15 1506 06/18/15 1617   06/18/15 1200  aztreonam (AZACTAM) 250 mg in dextrose 5 % 50 mL IVPB  Status:  Discontinued    Comments:  This is a supplemental dose due to be given after each HD session   250 mg 100 mL/hr over 30 Minutes Intravenous Every T-Th-Sa (Hemodialysis) 06/17/15 0802 06/18/15 0951   06/17/15 1400  aztreonam (AZACTAM) 1 g in dextrose 5 % 50 mL IVPB  Status:  Discontinued     1 g 100 mL/hr over 30 Minutes Intravenous Every 24 hours 06/16/15 1306 06/17/15 0802   06/17/15 0900  aztreonam (AZACTAM) 500 mg in dextrose 5 % 50 mL IVPB  Status:  Discontinued     500 mg 100 mL/hr over 30 Minutes Intravenous Every 8 hours 06/17/15 0802 06/18/15 1012   06/17/15 0100  tigecycline (TYGACIL) 50 mg in sodium chloride 0.9 % 100 mL IVPB  Status:  Discontinued     50 mg 200 mL/hr over 30 Minutes Intravenous Every 12 hours 06/16/15 1154 06/18/15 0951   06/16/15 1300  tigecycline (TYGACIL) 100 mg in sodium chloride 0.9 % 100 mL IVPB     100 mg 200 mL/hr over 30 Minutes Intravenous  Once 06/16/15 1154 06/16/15 1434   06/16/15 1300  aztreonam (AZACTAM) 2 g in dextrose 5 % 50 mL IVPB     2 g 100 mL/hr over 30 Minutes Intravenous  Once 06/16/15 1214 06/16/15 1353   06/15/15 1200  cefTAZidime (FORTAZ) 2 g in dextrose 5 % 50 mL IVPB  Status:  Discontinued     2 g 100 mL/hr over 30 Minutes Intravenous Every T-Th-Sa  (Hemodialysis) 06/14/15 1038 06/16/15 1154   06/14/15 1200  piperacillin-tazobactam (ZOSYN) IVPB 2.25 g  Status:  Discontinued     2.25 g 100 mL/hr over 30 Minutes Intravenous 4 times per day 06/14/15 0930 06/14/15 1038   06/14/15 1100  metroNIDAZOLE (FLAGYL) IVPB 500 mg  Status:  Discontinued     500 mg 100 mL/hr over 60 Minutes Intravenous Every 8 hours 06/14/15 1020 06/16/15 1154   06/13/15 2000  cefTAZidime (FORTAZ) 2 g in dextrose 5 % 50 mL IVPB     2 g 100 mL/hr over 30 Minutes Intravenous  Once 06/13/15 1648 06/13/15 2109   06/10/15 1700  cefTAZidime (FORTAZ) 1 g in dextrose 5 % 50 mL IVPB  Status:  Discontinued     1 g 100 mL/hr over 30 Minutes Intravenous Every 24 hours 06/10/15 1013 06/10/15 1024   06/10/15 1200  cefTAZidime (FORTAZ) 2 g in dextrose 5 % 50 mL IVPB  Status:  Discontinued     2 g 100 mL/hr over 30 Minutes Intravenous Every M-W-F (Hemodialysis) 06/10/15 1024 06/14/15 0930   06/09/15 1745  vancomycin (VANCOCIN) IVPB 1000 mg/200 mL premix     1,000 mg  200 mL/hr over 60 Minutes Intravenous  Once 06/09/15 1718 06/09/15 2105   06/08/15 1730  vancomycin (VANCOCIN) IVPB 1000 mg/200 mL premix     1,000 mg 200 mL/hr over 60 Minutes Intravenous  Once 06/08/15 1721 06/08/15 2141   06/08/15 1700  ceFEPIme (MAXIPIME) 1 g in dextrose 5 % 50 mL IVPB  Status:  Discontinued     1 g 100 mL/hr over 30 Minutes Intravenous Every 24 hours 06/07/15 1951 06/10/15 1013   06/08/15 0230  vancomycin (VANCOCIN) IVPB 1000 mg/200 mL premix     1,000 mg 200 mL/hr over 60 Minutes Intravenous NOW 06/08/15 0227 06/08/15 0349   06/07/15 1951  vancomycin (VANCOCIN) IVPB 1000 mg/200 mL premix  Status:  Discontinued     1,000 mg 200 mL/hr over 60 Minutes Intravenous Every Dialysis 06/07/15 1951 06/07/15 2248   06/07/15 1715  vancomycin (VANCOCIN) 2,000 mg in sodium chloride 0.9 % 500 mL IVPB     2,000 mg 250 mL/hr over 120 Minutes Intravenous  Once 06/07/15 1708 06/07/15 2300   06/07/15 1700   ceFEPIme (MAXIPIME) 1 g in dextrose 5 % 50 mL IVPB     1 g 100 mL/hr over 30 Minutes Intravenous  Once 06/07/15 1658 06/07/15 1834      Family Communication: no family was present at bedside, at the time of interview.   Disposition:  Barriers to safe discharge: Improvement in respiratory status, improvement in diarrhea, GI bleed   Intake/Output Summary (Last 24 hours) at 06/29/15 1419 Last data filed at 06/29/15 0954  Gross per 24 hour  Intake    980 ml  Output   1604 ml  Net   -624 ml   Filed Weights   06/27/15 0435 06/28/15 0500 06/28/15 1329  Weight: 82.6 kg (182 lb 1.6 oz) 83.5 kg (184 lb 1.4 oz) 83.8 kg (184 lb 11.9 oz)    Objective: Physical Exam: Filed Vitals:   06/28/15 2313 06/29/15 0525 06/29/15 0912 06/29/15 0945  BP: 108/48 120/43  142/83  Pulse: 91 95  89  Temp: 98.9 F (37.2 C) 99 F (37.2 C)  99.3 F (37.4 C)  TempSrc:    Oral  Resp: 20 21  20   Height:      Weight:      SpO2: 99% 99% 87% 95%     General: Appear in moderate distress, no Rash; Oral Mucosa moist. Cardiovascular: S1 and S2 Present, no Murmur, no JVD Respiratory: Bilateral Air entry present and Clear to Auscultation, no Crackles, no wheezes Abdomen: Bowel Sound present, Soft and no tenderness Extremities: no Pedal edema, no calf tenderness Neurology: Grossly no focal neuro deficit.  Data Reviewed: CBC:  Recent Labs Lab 06/26/15 1637 06/26/15 2136 06/27/15 0225 06/28/15 0912 06/29/15 0549  WBC 14.7* 11.5* 11.9* 9.8 7.0  HGB 7.3* 7.7* 7.0* 8.3* 8.2*  HCT 22.7* 22.8* 21.4* 25.7* 25.2*  MCV 93.0 90.8 91.8 93.1 94.0  PLT 120* 106* 68* 84* 84*   Basic Metabolic Panel:  Recent Labs Lab 06/23/15 0910 06/24/15 0321 06/24/15 1501 06/24/15 1753 06/25/15 0328 06/25/15 0940 06/26/15 0233 06/27/15 0225 06/29/15 0549  NA 132* 138 133* 135 134*  --  133* 137 143  K 6.1* 6.2* 6.3* 3.1* 6.0* 4.8 5.3* 4.3 3.4*  CL 93* 100* 99* 94* 97*  --  94* 102 104  CO2 29 29 29   --  29  --  29  26 28   GLUCOSE 109* 118* 123* 103* 88  --  118* 78 91  BUN 42* 26* 34* 6 17  --  48* 33* 18  CREATININE 5.00* 3.46* 4.30* 1.10 2.43*  --  4.01* 2.65* 2.52*  CALCIUM 8.1* 8.0* 8.0*  --  7.8*  --  7.6* 7.4* 7.6*  MG 2.3 2.2  --   --  2.0  --  1.9  --   --   PHOS  --   --  4.5  --   --   --   --  3.5  --    Liver Function Tests:  Recent Labs Lab 06/24/15 1501 06/27/15 0225 06/29/15 0549  AST  --   --  206*  ALT  --   --  349*  ALKPHOS  --   --  59  BILITOT  --   --  1.1  PROT  --   --  4.5*  ALBUMIN 2.3* 2.1* 2.1*   No results for input(s): LIPASE, AMYLASE in the last 168 hours. No results for input(s): AMMONIA in the last 168 hours.  Cardiac Enzymes: No results for input(s): CKTOTAL, CKMB, CKMBINDEX, TROPONINI in the last 168 hours.  BNP (last 3 results)  Recent Labs  05/13/15 2017 06/08/15 0016  BNP 362.1* 892.3*    CBG: No results for input(s): GLUCAP in the last 168 hours.  Recent Results (from the past 240 hour(s))  C difficile quick scan w PCR reflex     Status: None   Collection Time: 06/29/15  5:52 AM  Result Value Ref Range Status   C Diff antigen NEGATIVE NEGATIVE Final   C Diff toxin NEGATIVE NEGATIVE Final   C Diff interpretation Negative for toxigenic C. difficile  Final     Studies: No results found.   Scheduled Meds: . antiseptic oral rinse  7 mL Mouth Rinse BID  . arformoterol  15 mcg Nebulization BID  . atorvastatin  20 mg Oral q1800  . budesonide (PULMICORT) nebulizer solution  0.5 mg Nebulization BID  . chlorhexidine gluconate  15 mL Mouth Rinse BID  . darbepoetin (ARANESP) injection - DIALYSIS  200 mcg Intravenous Q Mon-HD  . doxercalciferol  2 mcg Intravenous Q M,W,F-HD  . feeding supplement (NEPRO CARB STEADY)  237 mL Oral BID BM  . feeding supplement (PRO-STAT SUGAR FREE 64)  30 mL Oral BID  . metoprolol tartrate  50 mg Oral BID  . multivitamin  1 tablet Oral QHS  . pantoprazole (PROTONIX) IV  40 mg Intravenous Q12H  . sevelamer  carbonate  800 mg Oral TID WC  . sodium chloride  3 mL Intravenous Q12H   Continuous Infusions:  PRN Meds: acetaminophen, clonazePAM, docusate sodium, fentaNYL (SUBLIMAZE) injection, guaiFENesin, levalbuterol, methocarbamol, metoprolol, ondansetron, polyethylene glycol, RESOURCE THICKENUP CLEAR, zolpidem  Time spent: 35 minutes  Author: Berle Mull, MD [facility] Pager: [phone] 06/29/2015 2:19 PM  If 7PM-7AM, please contact night-coverage at [email or web address], password [facility]

## 2015-06-30 DIAGNOSIS — Z992 Dependence on renal dialysis: Secondary | ICD-10-CM | POA: Diagnosis not present

## 2015-06-30 DIAGNOSIS — I120 Hypertensive chronic kidney disease with stage 5 chronic kidney disease or end stage renal disease: Secondary | ICD-10-CM | POA: Diagnosis not present

## 2015-06-30 DIAGNOSIS — D638 Anemia in other chronic diseases classified elsewhere: Secondary | ICD-10-CM | POA: Diagnosis not present

## 2015-06-30 DIAGNOSIS — I25700 Atherosclerosis of coronary artery bypass graft(s), unspecified, with unstable angina pectoris: Secondary | ICD-10-CM | POA: Diagnosis not present

## 2015-06-30 DIAGNOSIS — N186 End stage renal disease: Secondary | ICD-10-CM | POA: Diagnosis not present

## 2015-06-30 DIAGNOSIS — K921 Melena: Secondary | ICD-10-CM | POA: Diagnosis not present

## 2015-06-30 DIAGNOSIS — I482 Chronic atrial fibrillation: Secondary | ICD-10-CM | POA: Diagnosis not present

## 2015-06-30 DIAGNOSIS — D689 Coagulation defect, unspecified: Secondary | ICD-10-CM | POA: Diagnosis not present

## 2015-06-30 DIAGNOSIS — J189 Pneumonia, unspecified organism: Secondary | ICD-10-CM | POA: Diagnosis not present

## 2015-06-30 DIAGNOSIS — D631 Anemia in chronic kidney disease: Secondary | ICD-10-CM | POA: Diagnosis not present

## 2015-06-30 DIAGNOSIS — N2581 Secondary hyperparathyroidism of renal origin: Secondary | ICD-10-CM | POA: Diagnosis not present

## 2015-06-30 DIAGNOSIS — K922 Gastrointestinal hemorrhage, unspecified: Secondary | ICD-10-CM | POA: Diagnosis not present

## 2015-06-30 DIAGNOSIS — D62 Acute posthemorrhagic anemia: Secondary | ICD-10-CM | POA: Diagnosis not present

## 2015-06-30 DIAGNOSIS — E1122 Type 2 diabetes mellitus with diabetic chronic kidney disease: Secondary | ICD-10-CM | POA: Diagnosis not present

## 2015-06-30 DIAGNOSIS — J9601 Acute respiratory failure with hypoxia: Secondary | ICD-10-CM | POA: Diagnosis not present

## 2015-06-30 LAB — CBC WITH DIFFERENTIAL/PLATELET
BASOS ABS: 0 10*3/uL (ref 0.0–0.1)
BASOS PCT: 0 %
Eosinophils Absolute: 0.5 10*3/uL (ref 0.0–0.7)
Eosinophils Relative: 7 %
HEMATOCRIT: 25.2 % — AB (ref 39.0–52.0)
HEMOGLOBIN: 8.1 g/dL — AB (ref 13.0–17.0)
LYMPHS PCT: 9 %
Lymphs Abs: 0.6 10*3/uL — ABNORMAL LOW (ref 0.7–4.0)
MCH: 30.8 pg (ref 26.0–34.0)
MCHC: 32.1 g/dL (ref 30.0–36.0)
MCV: 95.8 fL (ref 78.0–100.0)
MONO ABS: 0.7 10*3/uL (ref 0.1–1.0)
Monocytes Relative: 10 %
NEUTROS ABS: 5.1 10*3/uL (ref 1.7–7.7)
NEUTROS PCT: 74 %
Platelets: 89 10*3/uL — ABNORMAL LOW (ref 150–400)
RBC: 2.63 MIL/uL — AB (ref 4.22–5.81)
RDW: 19.3 % — AB (ref 11.5–15.5)
WBC: 6.9 10*3/uL (ref 4.0–10.5)

## 2015-06-30 LAB — COMPREHENSIVE METABOLIC PANEL
ALBUMIN: 2 g/dL — AB (ref 3.5–5.0)
ALT: 218 U/L — AB (ref 17–63)
ANION GAP: 8 (ref 5–15)
AST: 91 U/L — AB (ref 15–41)
Alkaline Phosphatase: 57 U/L (ref 38–126)
BUN: 31 mg/dL — ABNORMAL HIGH (ref 6–20)
CALCIUM: 7.3 mg/dL — AB (ref 8.9–10.3)
CO2: 25 mmol/L (ref 22–32)
Chloride: 102 mmol/L (ref 101–111)
Creatinine, Ser: 3.65 mg/dL — ABNORMAL HIGH (ref 0.61–1.24)
GFR calc non Af Amer: 15 mL/min — ABNORMAL LOW (ref >60)
GFR, EST AFRICAN AMERICAN: 17 mL/min — AB (ref >60)
Glucose, Bld: 112 mg/dL — ABNORMAL HIGH (ref 65–99)
Potassium: 3.6 mmol/L (ref 3.5–5.1)
Sodium: 135 mmol/L (ref 135–145)
TOTAL PROTEIN: 4.2 g/dL — AB (ref 6.5–8.1)
Total Bilirubin: 0.6 mg/dL (ref 0.3–1.2)

## 2015-06-30 LAB — PROTIME-INR
INR: 1.11 (ref 0.00–1.49)
Prothrombin Time: 14.5 seconds (ref 11.6–15.2)

## 2015-06-30 LAB — PHOSPHORUS: PHOSPHORUS: 3.1 mg/dL (ref 2.5–4.6)

## 2015-06-30 MED ORDER — TRAMADOL HCL 50 MG PO TABS: 50.0000 mg | ORAL_TABLET | Freq: Four times a day (QID) | ORAL | Status: DC | PRN

## 2015-06-30 MED ORDER — PANTOPRAZOLE SODIUM 40 MG PO TBEC
40.0000 mg | DELAYED_RELEASE_TABLET | Freq: Two times a day (BID) | ORAL | Status: DC
  Administered 2015-06-30 – 2015-07-04 (×7): 40 mg via ORAL
  Filled 2015-06-30 (×7): qty 1

## 2015-06-30 MED ORDER — PRAMOXINE-ZINC OXIDE IN MO 1-12.5 % RE OINT: TOPICAL_OINTMENT | Freq: Three times a day (TID) | RECTAL | Status: DC | PRN

## 2015-06-30 MED ORDER — TRAMADOL HCL 50 MG PO TABS
50.0000 mg | ORAL_TABLET | Freq: Two times a day (BID) | ORAL | Status: DC | PRN
  Administered 2015-06-30 – 2015-07-01 (×2): 100 mg via ORAL
  Filled 2015-06-30 (×2): qty 2

## 2015-06-30 MED ORDER — BACID PO TABS
2.0000 | ORAL_TABLET | Freq: Three times a day (TID) | ORAL | Status: DC
  Administered 2015-06-30 – 2015-07-04 (×12): 2 via ORAL
  Filled 2015-06-30 (×15): qty 2

## 2015-06-30 NOTE — Progress Notes (Signed)
RN called into room by patient's wife concerned about a snoring noise made by patient. Patient assessed and vitals taken. Patient is using accessory muscles to breathe. Vitals: Temp: 97.7, BP: 92/47 after night time dose of Metoprolol, pulse: 83, O2 sat: 95% on 3L of oxygen via nasal cannula, resp: 20. RN assured patient's wife that vitals are stable and that RN will continue to monitor patient. If any other concerns arise, to call RN.   Ermalinda Memos, RN

## 2015-06-30 NOTE — Progress Notes (Signed)
Pt refuses to complete any ADLS by himself, including feeding himself or even picking up a spoon to take his medicine. When asked why he is unable to move his arms pt states because "his bottom hurts." Educated pt and pt's wife on deconditioning. Encouraged pt to try to do what he can and encouraged pt's wife to do the same.

## 2015-06-30 NOTE — Progress Notes (Signed)
Daily Rounding Note  06/30/2015, 9:40 AM  LOS: 23 days   SUBJECTIVE:       No word yet on if/when he is to restart Coumadin.  Stool is still black, liquid; volume of stool output not clear.  Still has pain, tenderness within rectum and in perirectal/buttocks area, especially when he turns.  Medical assistant who cleaned him up last 2 days says skin is looking better and is "drying up" Tolerating full liquids so was told he would be advanced to soft diet, but orders still are for FLs.    OBJECTIVE:         Vital signs in last 24 hours:    Temp:  [98.2 F (36.8 C)-99.4 F (37.4 C)] 98.2 F (36.8 C) (01/08 0844) Pulse Rate:  [84-96] 90 (01/08 0844) Resp:  [20-22] 22 (01/08 0844) BP: (130-155)/(58-83) 136/64 mmHg (01/08 0844) SpO2:  [93 %-100 %] 100 % (01/08 0844) Weight:  [83.462 kg (184 lb)] 83.462 kg (184 lb) (01/08 0345) Last BM Date: 06/29/15 Filed Weights   06/28/15 0500 06/28/15 1329 06/30/15 0345  Weight: 83.5 kg (184 lb 1.4 oz) 83.8 kg (184 lb 11.9 oz) 83.462 kg (184 lb)   General: frail, ill, weak.    Heart: RRR.   Chest: clear bil.  Reduced BS.  Some wet vocal quality, weak vocalization Abdomen: obese, soft, tender and bruised on right (improved).  BS active.   Extremities: no CCE.   Neuro/Psych:  Oriented x 3, no limb weakness, tongue midline.  More alert but still lethargic.   Intake/Output from previous day: 01/07 0701 - 01/08 0700 In: 360 [P.O.:360] Out: 200 [Urine:200]  Intake/Output this shift: Total I/O In: 160 [P.O.:160] Out: 0   Lab Results:  Recent Labs  06/28/15 0912 06/29/15 0549 06/30/15 0558  WBC 9.8 7.0 6.9  HGB 8.3* 8.2* 8.1*  HCT 25.7* 25.2* 25.2*  PLT 84* 84* 89*   BMET  Recent Labs  06/29/15 0549 06/30/15 0558  NA 143 135  K 3.4* 3.6  CL 104 102  CO2 28 25  GLUCOSE 91 112*  BUN 18 31*  CREATININE 2.52* 3.65*  CALCIUM 7.6* 7.3*   LFT  Recent Labs   06/29/15 0549 06/30/15 0558  PROT 4.5* 4.2*  ALBUMIN 2.1* 2.0*  AST 206* 91*  ALT 349* 218*  ALKPHOS 59 57  BILITOT 1.1 0.6   PT/INR  Recent Labs  06/29/15 0549 06/30/15 0558  LABPROT 15.3* 14.5  INR 1.19 1.11   Hepatitis Panel No results for input(s): HEPBSAG, HCVAB, HEPAIGM, HEPBIGM in the last 72 hours.  Studies/Results: No results found.  Scheduled Meds: . antiseptic oral rinse  7 mL Mouth Rinse BID  . arformoterol  15 mcg Nebulization BID  . atorvastatin  20 mg Oral q1800  . budesonide (PULMICORT) nebulizer solution  0.5 mg Nebulization BID  . chlorhexidine gluconate  15 mL Mouth Rinse BID  . darbepoetin (ARANESP) injection - DIALYSIS  200 mcg Intravenous Q Mon-HD  . doxercalciferol  2 mcg Intravenous Q M,W,F-HD  . feeding supplement (NEPRO CARB STEADY)  237 mL Oral BID BM  . feeding supplement (PRO-STAT SUGAR FREE 64)  30 mL Oral BID  . metoprolol tartrate  50 mg Oral BID  . multivitamin  1 tablet Oral QHS  . pantoprazole (PROTONIX) IV  40 mg Intravenous Q12H  . sevelamer carbonate  800 mg Oral TID WC  . sodium chloride  3 mL Intravenous Q12H  Continuous Infusions:  PRN Meds:.acetaminophen, clonazePAM, docusate sodium, fentaNYL (SUBLIMAZE) injection, guaiFENesin, levalbuterol, methocarbamol, metoprolol, ondansetron, polyethylene glycol, RESOURCE THICKENUP CLEAR, zolpidem  ASSESMENT:   * GI bleed. Suspect upper GI source  Hx adenomatous colon polyps and dense left sided diverticulosis. No PPI and place prior to admission but has been receiving PPI for several days, currently covered with Protonix drip.  * ABL on top of chronic anemia. S/p PRBC x 3, 1/4 and 1/6. Hgb improved, stable. On aranesp.  Received Ferric Gluconate 12/17 - 12/27.    * A fib, requiring chronic Coumadin, reversed and on hold due to GI bleeding.   * History of late stage CKD, evolved to HD requiring end-stage renal disease 04/2015.   * Thrombocytopenia.Non-critical, stable.    * Drug resistant HCAP requiring intubations, chest tube for PTX and bronch this admission. Steroids and abx completed.   * Debilitated.     PLAN   *  Continue BID PPI, will change to po now.    *  Renal soft diet.     Azucena Freed  06/30/2015, 9:40 AM Pager: [phone]   GI ATTENDING  Interval history data reviewed. Patient seen and examined. Wife in room with multiple questions. Agree with interval progress note. No evidence for further GI bleeding. At this point continue on twice a day PPI therapy. If at some point reintroduction of anticoagulation therapy desired, then would recommend diagnostic EGD prior given transient GI bleed on anticoagulation. If so, would be best if his cardiopulmonary status was optimized. We're available if needed. Continuity GI care if needed with Dr. Ardis Hughs. Will sign off.  Docia Chuck. Geri Seminole., M.D. [facility] Division of Gastroenterology

## 2015-06-30 NOTE — Progress Notes (Signed)
S: Feels good except for pain from flexiseal O:BP 136/64 mmHg  Pulse 90  Temp(Src) 98.2 F (36.8 C) (Oral)  Resp 22  Ht 5\' 7"  (1.702 m)  Wt 83.462 kg (184 lb)  BMI 28.81 kg/m2  SpO2 100%  Intake/Output Summary (Last 24 hours) at 06/30/15 0850 Last data filed at 06/30/15 0845  Gross per 24 hour  Intake    520 ml  Output    200 ml  Net    320 ml   Weight change: -0.338 kg (-11.9 oz) [id number] and alert [id number], irreg Resp: Decreased BS Abd:+ BS NTND Ext: 0 edema, RUA  AVF+ bruit (placed 05/21/15) NEURO:CNI Ox3 no asterixis Rt PC   . antiseptic oral rinse  7 mL Mouth Rinse BID  . arformoterol  15 mcg Nebulization BID  . atorvastatin  20 mg Oral q1800  . budesonide (PULMICORT) nebulizer solution  0.5 mg Nebulization BID  . chlorhexidine gluconate  15 mL Mouth Rinse BID  . darbepoetin (ARANESP) injection - DIALYSIS  200 mcg Intravenous Q Mon-HD  . doxercalciferol  2 mcg Intravenous Q M,W,F-HD  . feeding supplement (NEPRO CARB STEADY)  237 mL Oral BID BM  . feeding supplement (PRO-STAT SUGAR FREE 64)  30 mL Oral BID  . metoprolol tartrate  50 mg Oral BID  . multivitamin  1 tablet Oral QHS  . pantoprazole (PROTONIX) IV  40 mg Intravenous Q12H  . sevelamer carbonate  800 mg Oral TID WC  . sodium chloride  3 mL Intravenous Q12H   No results found. BMET    Component Value Date/Time   NA 135 06/30/2015 0558   K 3.6 06/30/2015 0558   CL 102 06/30/2015 0558   CO2 25 06/30/2015 0558   GLUCOSE 112* 06/30/2015 0558   BUN 31* 06/30/2015 0558   CREATININE 3.65* 06/30/2015 0558   CREATININE 2.62* 05/25/2014 1711   CALCIUM 7.3* 06/30/2015 0558   GFRNONAA 15* 06/30/2015 0558   GFRAA 17* 06/30/2015 0558   CBC    Component Value Date/Time   WBC 6.9 06/30/2015 0558   RBC 2.63* 06/30/2015 0558   HGB 8.1* 06/30/2015 0558   HCT 25.2* 06/30/2015 0558   PLT 89* 06/30/2015 0558   MCV 95.8 06/30/2015 0558   MCH 30.8 06/30/2015 0558   MCHC 32.1 06/30/2015 0558   RDW 19.3*  06/30/2015 0558   LYMPHSABS 0.6* 06/30/2015 0558   MONOABS 0.7 06/30/2015 0558   EOSABS 0.5 06/30/2015 0558   BASOSABS 0.0 06/30/2015 0558     Assessment: 1. PNA 2. ESRD  MWF [facility] 3. Anemia on aranesp 3. A fib  4. GI bleed 5. Sec HPTH on hectorol 7. thrombocytopenia  Plan: 1. HD mon   Rolena Knutson T

## 2015-06-30 NOTE — Progress Notes (Signed)
[facility] Progress Note  Patient: Terry [name] [id number]   PCP: Colin Benton R., DO DOB: 21-May-1935   DOA: 06/07/2015   DOS: 06/30/2015   Date of Service: the patient was seen and examined on 06/30/2015  Subjective: Patient continues to complain of the pain around the flexiseal tube, breathing continues to remain the same, no chest pain and abdominal pain. Nutrition: Tolerating liquid diet Activity: Mostly bedbound Last BM: Has rectal tube  Significant Events: 12/16 intubated, L tension ptx post intubation, chest tube placed in ER  12/18 extubated 12/19 worsening distress, HD, bipap, small PTX resolved after chest tube back to suction 12/20 weak resp status remains, failed high flow nasal cannula O2 reservoir  12/21 required intubation, bronch w/ bloody clots, no DAH 12/25 extubated  1/1 warfarin resumed  1/4 UGIB - transfused - warfarin reversed 1/6 Hgb stabilized - INR normalized   Assessment and Plan: 1. HCAP (healthcare-associated pneumonia)  Sepsis with acute hypoxic respiratory failure COPD Gold 3 with acute exacerbation Tension pneumothorax after intubation Chest tube placement and removal  Presented with cough with hypoxic respiratory failure requiring intubation, ET aspirate positive for multidrug resistant Klebsiella pneumoniae as well as Enterobacter  Developed tension pneumothorax requiring chest tube.  At present chest tube has been removed, extubated, respirations status improved. 3 L nasal cannula currently. Completed antibiotic treatment per infectious disease on 06/28/2015. We will continue close monitoring on telemetry. Continue Brovana, Pulmicort nebulizer, Xopenex as needed and SLP evaluation  2. Upper GI bleed  Symptomatic anemia with acute blood loss  Hemoccult-positive, patient was on warfarin for atrial fibrillation, warfarin has been reversed, Status post 3 PRBC. GI consulted, currently conservative management with holding  anticoagulation. H&H remain stable, diet advance to soft diet, likely remove flexiseal after hemodialysis  3. Chronic A. Fib, Rate is controlled, no Coumadin due to GI bleed.  4. Essential hypertension Blood pressure well controlled, continue metoprolol. Holding amlodipine.  5. ESRD on hemodialysis MWF Electrolyte management Chronic diastolic dysfunction Continue scheduled hemodialysis per nephrology. Appreciate input.  6. Diarrhea. Continue rectal tube, as diarrhea is causing extensive skin excoriation. Wound care consulted. C. difficile negative. Add probiotics. Add barrier cream  DVT Prophylaxis: mechanical compression device. Nutrition: Renal diet Advance goals of care discussion: Full code  Brief Summary of Hospitalization:  HPI: As per the H and P dictated on admission, "patient presented from hemodialysis with complaints of cough with expectoration as well as shortness of breath. This was ongoing 2 days prior to admission, while waiting in the ER he had rapid worsening of his respiratory status requiring BiPAP followed by intubation" Procedures: Hemodialysis Echocardiogram 06/22/2015, ejection fraction [id number], diastolic dysfunction, Chest tube removal 06/19/2015 Consultants: Critical care, nephrology, gastroenterology, infectious disease.  Antibiotics: Anti-infectives    Start     Dose/Rate Route Frequency Ordered Stop   06/25/15 2200  ciprofloxacin (CIPRO) IVPB 400 mg     400 mg 200 mL/hr over 60 Minutes Intravenous Every 24 hours 06/25/15 2124 06/28/15 2345   06/19/15 1800  meropenem (MERREM) 1 g in sodium chloride 0.9 % 100 mL IVPB    Comments:  Give after HD on dialysis days   1 g 200 mL/hr over 30 Minutes Intravenous Every 24 hours 06/18/15 1506 06/28/15 2130   06/19/15 1600  sulfamethoxazole-trimethoprim (BACTRIM) 400 mg in dextrose 5 % 500 mL IVPB  Status:  Discontinued     400 mg 350 mL/hr over 90 Minutes Intravenous Every 24 hours 06/18/15 1506 06/25/15  2119   06/18/15 1700  colistimethate ((COLYMYCIN-M)) nebulized solution 150 mg     150 mg Inhalation 2 times daily 06/18/15 1617 06/28/15 2359   06/18/15 1600  meropenem (MERREM) 1 g in sodium chloride 0.9 % 100 mL IVPB     1 g 200 mL/hr over 30 Minutes Intravenous NOW 06/18/15 1506 06/18/15 1700   06/18/15 1600  sulfamethoxazole-trimethoprim (BACTRIM) 400 mg in dextrose 5 % 500 mL IVPB    Comments:  Give after HD on dialysis days   400 mg 350 mL/hr over 90 Minutes Intravenous  Once 06/18/15 1506 06/18/15 1800   06/18/15 1600  colistimethate ((COLYMYCIN-M)) nebulized solution 75 mg  Status:  Discontinued     75 mg Inhalation 2 times daily 06/18/15 1506 06/18/15 1617   06/18/15 1200  aztreonam (AZACTAM) 250 mg in dextrose 5 % 50 mL IVPB  Status:  Discontinued    Comments:  This is a supplemental dose due to be given after each HD session   250 mg 100 mL/hr over 30 Minutes Intravenous Every T-Th-Sa (Hemodialysis) 06/17/15 0802 06/18/15 0951   06/17/15 1400  aztreonam (AZACTAM) 1 g in dextrose 5 % 50 mL IVPB  Status:  Discontinued     1 g 100 mL/hr over 30 Minutes Intravenous Every 24 hours 06/16/15 1306 06/17/15 0802   06/17/15 0900  aztreonam (AZACTAM) 500 mg in dextrose 5 % 50 mL IVPB  Status:  Discontinued     500 mg 100 mL/hr over 30 Minutes Intravenous Every 8 hours 06/17/15 0802 06/18/15 1012   06/17/15 0100  tigecycline (TYGACIL) 50 mg in sodium chloride 0.9 % 100 mL IVPB  Status:  Discontinued     50 mg 200 mL/hr over 30 Minutes Intravenous Every 12 hours 06/16/15 1154 06/18/15 0951   06/16/15 1300  tigecycline (TYGACIL) 100 mg in sodium chloride 0.9 % 100 mL IVPB     100 mg 200 mL/hr over 30 Minutes Intravenous  Once 06/16/15 1154 06/16/15 1434   06/16/15 1300  aztreonam (AZACTAM) 2 g in dextrose 5 % 50 mL IVPB     2 g 100 mL/hr over 30 Minutes Intravenous  Once 06/16/15 1214 06/16/15 1353   06/15/15 1200  cefTAZidime (FORTAZ) 2 g in dextrose 5 % 50 mL IVPB  Status:   Discontinued     2 g 100 mL/hr over 30 Minutes Intravenous Every T-Th-Sa (Hemodialysis) 06/14/15 1038 06/16/15 1154   06/14/15 1200  piperacillin-tazobactam (ZOSYN) IVPB 2.25 g  Status:  Discontinued     2.25 g 100 mL/hr over 30 Minutes Intravenous 4 times per day 06/14/15 0930 06/14/15 1038   06/14/15 1100  metroNIDAZOLE (FLAGYL) IVPB 500 mg  Status:  Discontinued     500 mg 100 mL/hr over 60 Minutes Intravenous Every 8 hours 06/14/15 1020 06/16/15 1154   06/13/15 2000  cefTAZidime (FORTAZ) 2 g in dextrose 5 % 50 mL IVPB     2 g 100 mL/hr over 30 Minutes Intravenous  Once 06/13/15 1648 06/13/15 2109   06/10/15 1700  cefTAZidime (FORTAZ) 1 g in dextrose 5 % 50 mL IVPB  Status:  Discontinued     1 g 100 mL/hr over 30 Minutes Intravenous Every 24 hours 06/10/15 1013 06/10/15 1024   06/10/15 1200  cefTAZidime (FORTAZ) 2 g in dextrose 5 % 50 mL IVPB  Status:  Discontinued     2 g 100 mL/hr over 30 Minutes Intravenous Every M-W-F (Hemodialysis) 06/10/15 1024 06/14/15 0930   06/09/15 1745  vancomycin (VANCOCIN) IVPB 1000 mg/200 mL premix  1,000 mg 200 mL/hr over 60 Minutes Intravenous  Once 06/09/15 1718 06/09/15 2105   06/08/15 1730  vancomycin (VANCOCIN) IVPB 1000 mg/200 mL premix     1,000 mg 200 mL/hr over 60 Minutes Intravenous  Once 06/08/15 1721 06/08/15 2141   06/08/15 1700  ceFEPIme (MAXIPIME) 1 g in dextrose 5 % 50 mL IVPB  Status:  Discontinued     1 g 100 mL/hr over 30 Minutes Intravenous Every 24 hours 06/07/15 1951 06/10/15 1013   06/08/15 0230  vancomycin (VANCOCIN) IVPB 1000 mg/200 mL premix     1,000 mg 200 mL/hr over 60 Minutes Intravenous NOW 06/08/15 0227 06/08/15 0349   06/07/15 1951  vancomycin (VANCOCIN) IVPB 1000 mg/200 mL premix  Status:  Discontinued     1,000 mg 200 mL/hr over 60 Minutes Intravenous Every Dialysis 06/07/15 1951 06/07/15 2248   06/07/15 1715  vancomycin (VANCOCIN) 2,000 mg in sodium chloride 0.9 % 500 mL IVPB     2,000 mg 250 mL/hr over 120  Minutes Intravenous  Once 06/07/15 1708 06/07/15 2300   06/07/15 1700  ceFEPIme (MAXIPIME) 1 g in dextrose 5 % 50 mL IVPB     1 g 100 mL/hr over 30 Minutes Intravenous  Once 06/07/15 1658 06/07/15 1834      Family Communication: family was present at bedside, at the time of interview. All questions were answered satisfactorily  Disposition:  Barriers to safe discharge: Improvement in respiratory status, improvement in diarrhea, GI bleed, most likely will need SNF   Intake/Output Summary (Last 24 hours) at 06/30/15 1434 Last data filed at 06/30/15 1300  Gross per 24 hour  Intake    400 ml  Output    300 ml  Net    100 ml   Filed Weights   06/28/15 0500 06/28/15 1329 06/30/15 0345  Weight: 83.5 kg (184 lb 1.4 oz) 83.8 kg (184 lb 11.9 oz) 83.462 kg (184 lb)    Objective: Physical Exam: Filed Vitals:   06/30/15 0344 06/30/15 0345 06/30/15 0844 06/30/15 1017  BP: 142/60  136/64   Pulse: 96  90 103  Temp: 99.4 F (37.4 C)  98.2 F (36.8 C)   TempSrc:   Oral   Resp: 22  22 18   Height:      Weight:  83.462 kg (184 lb)    SpO2: 100%  100% 94%    General: Appear in moderate distress, no Rash; Oral Mucosa moist. Cardiovascular: S1 and S2 Present, no Murmur, no JVD Respiratory: Bilateral Air entry present and Clear to Auscultation, no Crackles, no wheezes Abdomen: Bowel Sound present, Soft and no tenderness Extremities: no Pedal edema, no calf tenderness  Data Reviewed: CBC:  Recent Labs Lab 06/26/15 2136 06/27/15 0225 06/28/15 0912 06/29/15 0549 06/30/15 0558  WBC 11.5* 11.9* 9.8 7.0 6.9  NEUTROABS  --   --   --   --  5.1  HGB 7.7* 7.0* 8.3* 8.2* 8.1*  HCT 22.8* 21.4* 25.7* 25.2* 25.2*  MCV 90.8 91.8 93.1 94.0 95.8  PLT 106* 68* 84* 84* 89*   Basic Metabolic Panel:  Recent Labs Lab 06/24/15 0321 06/24/15 1501  06/25/15 0328 06/25/15 0940 06/26/15 0233 06/27/15 0225 06/29/15 0549 06/30/15 0558 06/30/15 1114  NA 138 133*  < > 134*  --  133* 137 143 135   --   K 6.2* 6.3*  < > 6.0* 4.8 5.3* 4.3 3.4* 3.6  --   CL 100* 99*  < > 97*  --  94* 102  104 102  --   CO2 29 29  --  29  --  29 26 28 25   --   GLUCOSE 118* 123*  < > 88  --  118* 78 91 112*  --   BUN 26* 34*  < > 17  --  48* 33* 18 31*  --   CREATININE 3.46* 4.30*  < > 2.43*  --  4.01* 2.65* 2.52* 3.65*  --   CALCIUM 8.0* 8.0*  --  7.8*  --  7.6* 7.4* 7.6* 7.3*  --   MG 2.2  --   --  2.0  --  1.9  --   --   --   --   PHOS  --  4.5  --   --   --   --  3.5  --   --  3.1  < > = values in this interval not displayed. Liver Function Tests:  Recent Labs Lab 06/24/15 1501 06/27/15 0225 06/29/15 0549 06/30/15 0558  AST  --   --  206* 91*  ALT  --   --  349* 218*  ALKPHOS  --   --  59 57  BILITOT  --   --  1.1 0.6  PROT  --   --  4.5* 4.2*  ALBUMIN 2.3* 2.1* 2.1* 2.0*   BNP (last 3 results)  Recent Labs  05/13/15 2017 06/08/15 0016  BNP 362.1* 892.3*    CBG: No results for input(s): GLUCAP in the last 168 hours.  Recent Results (from the past 240 hour(s))  C difficile quick scan w PCR reflex     Status: None   Collection Time: 06/29/15  5:52 AM  Result Value Ref Range Status   C Diff antigen NEGATIVE NEGATIVE Final   C Diff toxin NEGATIVE NEGATIVE Final   C Diff interpretation Negative for toxigenic C. difficile  Final     Studies: No results found.   Scheduled Meds: . antiseptic oral rinse  7 mL Mouth Rinse BID  . arformoterol  15 mcg Nebulization BID  . atorvastatin  20 mg Oral q1800  . budesonide (PULMICORT) nebulizer solution  0.5 mg Nebulization BID  . chlorhexidine gluconate  15 mL Mouth Rinse BID  . darbepoetin (ARANESP) injection - DIALYSIS  200 mcg Intravenous Q Mon-HD  . doxercalciferol  2 mcg Intravenous Q M,W,F-HD  . feeding supplement (NEPRO CARB STEADY)  237 mL Oral BID BM  . feeding supplement (PRO-STAT SUGAR FREE 64)  30 mL Oral BID  . lactobacillus acidophilus  2 tablet Oral TID  . metoprolol tartrate  50 mg Oral BID  . multivitamin  1 tablet  Oral QHS  . pantoprazole  40 mg Oral BID AC  . sevelamer carbonate  800 mg Oral TID WC  . sodium chloride  3 mL Intravenous Q12H   Continuous Infusions:  PRN Meds: acetaminophen, clonazePAM, guaiFENesin, levalbuterol, methocarbamol, ondansetron, pramoxine-mineral oil-zinc, RESOURCE THICKENUP CLEAR, traMADol, zolpidem  Time spent: 30 minutes  Author: Berle Mull, MD [facility] Pager: [phone] 06/30/2015 2:34 PM  If 7PM-7AM, please contact night-coverage at [email or web address], password [facility]

## 2015-07-01 ENCOUNTER — Inpatient Hospital Stay (HOSPITAL_COMMUNITY): Payer: Medicare PPO

## 2015-07-01 DIAGNOSIS — I25700 Atherosclerosis of coronary artery bypass graft(s), unspecified, with unstable angina pectoris: Secondary | ICD-10-CM | POA: Diagnosis not present

## 2015-07-01 DIAGNOSIS — J9601 Acute respiratory failure with hypoxia: Secondary | ICD-10-CM | POA: Diagnosis not present

## 2015-07-01 DIAGNOSIS — E1122 Type 2 diabetes mellitus with diabetic chronic kidney disease: Secondary | ICD-10-CM | POA: Diagnosis not present

## 2015-07-01 DIAGNOSIS — J189 Pneumonia, unspecified organism: Secondary | ICD-10-CM | POA: Diagnosis not present

## 2015-07-01 DIAGNOSIS — D62 Acute posthemorrhagic anemia: Secondary | ICD-10-CM | POA: Diagnosis not present

## 2015-07-01 DIAGNOSIS — D638 Anemia in other chronic diseases classified elsewhere: Secondary | ICD-10-CM | POA: Diagnosis not present

## 2015-07-01 DIAGNOSIS — N186 End stage renal disease: Secondary | ICD-10-CM | POA: Diagnosis not present

## 2015-07-01 DIAGNOSIS — R0602 Shortness of breath: Secondary | ICD-10-CM | POA: Diagnosis not present

## 2015-07-01 DIAGNOSIS — K922 Gastrointestinal hemorrhage, unspecified: Secondary | ICD-10-CM | POA: Diagnosis not present

## 2015-07-01 DIAGNOSIS — N2581 Secondary hyperparathyroidism of renal origin: Secondary | ICD-10-CM | POA: Diagnosis not present

## 2015-07-01 DIAGNOSIS — I120 Hypertensive chronic kidney disease with stage 5 chronic kidney disease or end stage renal disease: Secondary | ICD-10-CM | POA: Diagnosis not present

## 2015-07-01 DIAGNOSIS — Z992 Dependence on renal dialysis: Secondary | ICD-10-CM | POA: Diagnosis not present

## 2015-07-01 DIAGNOSIS — I482 Chronic atrial fibrillation: Secondary | ICD-10-CM | POA: Diagnosis not present

## 2015-07-01 DIAGNOSIS — D631 Anemia in chronic kidney disease: Secondary | ICD-10-CM | POA: Diagnosis not present

## 2015-07-01 LAB — CBC
HCT: 26.8 % — ABNORMAL LOW (ref 39.0–52.0)
Hemoglobin: 8.4 g/dL — ABNORMAL LOW (ref 13.0–17.0)
MCH: 30.1 pg (ref 26.0–34.0)
MCHC: 31.3 g/dL (ref 30.0–36.0)
MCV: 96.1 fL (ref 78.0–100.0)
Platelets: 104 10*3/uL — ABNORMAL LOW (ref 150–400)
RBC: 2.79 MIL/uL — ABNORMAL LOW (ref 4.22–5.81)
RDW: 18.7 % — AB (ref 11.5–15.5)
WBC: 7.1 10*3/uL (ref 4.0–10.5)

## 2015-07-01 LAB — BLOOD GAS, ARTERIAL
Acid-Base Excess: 4 mmol/L — ABNORMAL HIGH (ref 0.0–2.0)
Bicarbonate: 28.4 mEq/L — ABNORMAL HIGH (ref 20.0–24.0)
DRAWN BY: 42624
O2 CONTENT: 2 L/min
O2 Saturation: 95 %
PH ART: 7.405 (ref 7.350–7.450)
Patient temperature: 98.6
TCO2: 29.9 mmol/L (ref 0–100)
pCO2 arterial: 46.3 mmHg — ABNORMAL HIGH (ref 35.0–45.0)
pO2, Arterial: 72.5 mmHg — ABNORMAL LOW (ref 80.0–100.0)

## 2015-07-01 LAB — RENAL FUNCTION PANEL
ALBUMIN: 2.2 g/dL — AB (ref 3.5–5.0)
Anion gap: 10 (ref 5–15)
BUN: 48 mg/dL — AB (ref 6–20)
CALCIUM: 7.6 mg/dL — AB (ref 8.9–10.3)
CO2: 28 mmol/L (ref 22–32)
Chloride: 97 mmol/L — ABNORMAL LOW (ref 101–111)
Creatinine, Ser: 5.36 mg/dL — ABNORMAL HIGH (ref 0.61–1.24)
GFR calc Af Amer: 11 mL/min — ABNORMAL LOW (ref >60)
GFR calc non Af Amer: 9 mL/min — ABNORMAL LOW (ref >60)
GLUCOSE: 100 mg/dL — AB (ref 65–99)
PHOSPHORUS: 4.6 mg/dL (ref 2.5–4.6)
POTASSIUM: 3.9 mmol/L (ref 3.5–5.1)
SODIUM: 135 mmol/L (ref 135–145)

## 2015-07-01 MED ORDER — DARBEPOETIN ALFA 200 MCG/0.4ML IJ SOSY
PREFILLED_SYRINGE | INTRAMUSCULAR | Status: AC
  Administered 2015-07-01: 200 ug via INTRAVENOUS
  Filled 2015-07-01: qty 0.4

## 2015-07-01 MED ORDER — HEPARIN SODIUM (PORCINE) 1000 UNIT/ML DIALYSIS: 40.0000 [IU]/kg | INTRAMUSCULAR | Status: DC | PRN

## 2015-07-01 MED ORDER — DOXERCALCIFEROL 4 MCG/2ML IV SOLN
INTRAVENOUS | Status: AC
  Administered 2015-07-01: 2 ug via INTRAVENOUS
  Filled 2015-07-01: qty 2

## 2015-07-01 MED ORDER — LEVALBUTEROL HCL 0.63 MG/3ML IN NEBU
0.6300 mg | INHALATION_SOLUTION | Freq: Four times a day (QID) | RESPIRATORY_TRACT | Status: DC
  Administered 2015-07-01 – 2015-07-03 (×6): 0.63 mg via RESPIRATORY_TRACT
  Filled 2015-07-01 (×7): qty 3

## 2015-07-01 NOTE — Progress Notes (Signed)
Patient requests the flexiseal to stay in. Pt still have loose bms.

## 2015-07-01 NOTE — Care Management Important Message (Signed)
Important Message  Patient Details  Name: KENWOOD LAWHUN MRN: [id number] Date of Birth: [birth year]   Medicare Important Message Given:  Yes    Yury Schaus, Rory Percy, RN 07/01/2015, 10:28 AM

## 2015-07-01 NOTE — Progress Notes (Signed)
Physical Therapy Treatment Patient Details Name: Terry [name] MRN: [id number] DOB: February 06, 1935 Today's Date: 07/01/2015    History of Present Illness pt is a 81 y/o male with h/o stroke, HTN, COPD, CAD, afib and OA, admitted with 2 day h/o SOB with condition deteriorating in HD.12/21 on endotrachial tube, extubated 12/25. Chest tube removed 12/28.    PT Comments    Pt adamantly refusing and has been refusing to participate in therapy or any care for himself. Observed patients wife giving patient sips of water via spoon despite pt's UE being functional. Pt and wife educated on importance of off-weighting buttock to promote healing and the importance of mobility to prevent deconditioning. Both pt and spouse con't to report "my butt hurts so bad, after they get this tube out i might be able to do something."  I'm recommending a palliative care consult to determine patient's goals of care as he's refusing most things and doesn't have a good understanding of his medical status. If patient is going to refuse therapy or to do things for himself it might be a better option to send him home with hospice.  Follow Up Recommendations  SNF (maybe home with hospice)     Equipment Recommendations       Recommendations for Other Services  (palliative consult)     Precautions / Restrictions Precautions Precautions: Fall Restrictions Weight Bearing Restrictions: No    Mobility  Bed Mobility               General bed mobility comments: refused any mobility  Transfers                 General transfer comment: refused  Ambulation/Gait             General Gait Details: refused   Stairs            Wheelchair Mobility    Modified Rankin (Stroke Patients Only)       Balance                                    Cognition Arousal/Alertness: Awake/alert (but sleepy, RN just gave pain meds) Behavior During Therapy:  (irritated, easily agitated) Overall  Cognitive Status: Impaired/Different from baseline Area of Impairment: Safety/judgement;Awareness         Safety/Judgement: Decreased awareness of safety Awareness: Intellectual   General Comments: pt doesn't understand that lying on his bottom con't to further break down his skin and cause pain and off loading bottom will promote healing despite maximal education    Exercises      General Comments General comments (skin integrity, edema, etc.): pt refused ther ex, mobility, but allowed [place] to change soilled gown after max encourgament      Pertinent Vitals/Pain Pain Assessment: Faces Faces Pain Scale: Hurts little more Pain Location: bottom, with repositioning Pain Descriptors / Indicators: Grimacing Pain Intervention(s): Limited activity within patient's tolerance;Monitored during session    Home Living                      Prior Function            PT Goals (current goals can now be found in the care plan section) Acute Rehab PT Goals Patient Stated Goal: make my butt stop hurting Progress towards PT goals: Not progressing toward goals - comment    Frequency  Min 2X/week    PT  Plan Current plan remains appropriate    Co-evaluation             End of Session   Activity Tolerance: Patient limited by pain Patient left: in bed;with call bell/phone within reach;with family/visitor present     Time: [id number] PT Time Calculation (min) (ACUTE ONLY): 16 min  Charges:  $Therapeutic Activity: 8-22 mins                    G Codes:      Terry [name] 07/01/2015, 11:53 AM   Terry [name], PT, DPT Pager #: [phone] Office #: [phone]

## 2015-07-01 NOTE — Progress Notes (Signed)
   07/01/15 1300  Clinical Encounter Type  Visited With Patient and family together  Visit Type Initial  Referral From Care management  Spiritual Encounters  Spiritual Needs Prayer;Emotional  Stress Factors  Patient Stress Factors Health changes  Family Stress Factors Major life changes;Exhausted  Pt had been in hospital for 25 days, chaplain assumed he might need a visit. Wife present. She indicated that she will retire sooner than she intended so that they can move back to [place], which she hopes will be healthier and happier for husband. Pt's wife requested prayer for recovery, which chaplain provided.

## 2015-07-01 NOTE — Progress Notes (Signed)
Speech Language Pathology Treatment: Dysphagia  Patient Details Name: KAYLUP KNABLE MRN: [id number] DOB: [birth year] Today's Date: 07/01/2015 Time: [id number] SLP Time Calculation (min) (ACUTE ONLY): 17 min  Assessment / Plan / Recommendation Clinical Impression  Pt asleep but awakens for PO trials of thin liquids. Per chart review he appears to have been tolerating advancement to soft solids and thin liquids as he remains afebrile with lung sounds unchanged. Wife says that he has generally been using the chin tuck, although she does provide occasional reminders. Attempted 3 ounce water challenge to better assess for possible airway compromise given silent penetration on previous MBS, however pt was not able to consecutively consume 3 ounces via cup or straw despite Mod cues from therapist due to c/o the water being "too cold for his teeth". Pt becoming frustrated, therefore additional attempts were held. No overt s/s of aspiration observed on liquids consumed, and he should have had adequate time post-extubation. His respiratory status continues to improve, although he does remain generally deconditioned. Recommend to continue soft solid diet and thin liquids but without chin tuck. SLP to f/u briefly for tolerance.   HPI HPI: 81yo male with hx ESRD, AFib, COPD, CVA presented 12/16 to the ED with a 2 day history of progressive SOB and cough with yellow sputum. Had worsening respiratory status while at HD and EMS was called. Diagnosed with PNA vs COPD exacerbation vs CHF exacerbation. Intubated 12/16-12/18, then again 12/21-12/25.      SLP Plan  Continue with current plan of care     Recommendations  Diet recommendations: Dysphagia 3 (mechanical soft);Thin liquid Liquids provided via: Cup Medication Administration: Crushed with puree Supervision: Patient able to self feed;Full supervision/cueing for compensatory strategies Compensations: Slow rate;Small sips/bites Postural Changes and/or Swallow  Maneuvers: Seated upright 90 degrees              Oral Care Recommendations: Oral care BID Follow up Recommendations: Skilled Nursing facility Plan: Continue with current plan of care   Germain Osgood, M.A. CCC-SLP [phone]  Germain Osgood 07/01/2015, 11:08 AM

## 2015-07-01 NOTE — Progress Notes (Addendum)
[facility] Progress Note  Assessment/Plan: 1. Sepsis/ HCAP- per primary with previous intubation- s/p antiotics  2. ESRD -MWF - HD later today - check labs pre HD - no heparin HD due to GIB 3. Anemia - Hgb 8,1 1/8 s/p 3 units pRBC on max ESA 4. Secondary hyperparathyroidism - hectorol 2- last corr Ca 8.9 P ok renvela 1 ac 5. HTN/volume - BP variable 6. Nutrition - alb 2 soft diet/nepro/prostat/vit 7. afib - no antiocoag 8. Diarrhea - cdiff neg, flexiseal - stool black 9. Debility -  See PT's notes, pt continues to refuse to engage in any therapy, PT recommends pall care consult for GOC.  He is doing very poorly w impending decub ulcer in the sacral area if he doesn't start getting OOB. He appears to have minimal understanding of how sick he is and is falling asleep during my conversation with him which is prob medication-related (prn klonopin, robaxin, tramadol and ambien). Called to d/w wife but no answer. I have stopped his prn sedating medications, will need to wake up and get OOB. Avoid sedating meds for now.  Have consulted palliative care for [place].   Myriam Jacobson, PA-C [place] [phone] 07/01/2015,11:07 AM  LOS: 24 days   Pt seen, examined, agree w assess/plan as above with additions as indicated.  Kelly Splinter MD [facility] pager [phone]    cell [phone] 07/01/2015, 2:42 PM      Subjective:   Eating starting to improve per wife. ST working with pt. Bottom is sore from diarrhea with skin excoriation per wife.  Objective Filed Vitals:   06/30/15 2100 06/30/15 2309 07/01/15 0500 07/01/15 1105  BP: 127/51 92/47 155/73 122/63  Pulse: 105 83 108 79  Temp: 98.5 F (36.9 C) 97.7 F (36.5 C) 99.3 F (37.4 C) 98 F (36.7 C)  TempSrc: Oral Oral Oral Oral  Resp: 18 20 18 18   Height:      Weight:  82 kg (180 lb 12.4 oz)    SpO2: 94% 95% 94% 96%   Physical Exam General: NAD-  Heart:irreg irreg Lungs: dim  BS Abdomen: distended + BS Extremities: no sig edema Dialysis Access: right IJ  Right upper AVF + bruit  Dialysis Orders:  TTS [facility] 4h 2/2.25 bath 86kg Hep 8600 R IJ cath / RUE AVF(maturing) Venofer 100/ hd x 10  Hect 1 ug  Additional Objective Labs: Basic Metabolic Panel:  Recent Labs Lab 06/24/15 1501  06/27/15 0225 06/29/15 0549 06/30/15 0558 06/30/15 1114  NA 133*  < > 137 143 135  --   K 6.3*  < > 4.3 3.4* 3.6  --   CL 99*  < > 102 104 102  --   CO2 29  < > 26 28 25   --   GLUCOSE 123*  < > 78 91 112*  --   BUN 34*  < > 33* 18 31*  --   CREATININE 4.30*  < > 2.65* 2.52* 3.65*  --   CALCIUM 8.0*  < > 7.4* 7.6* 7.3*  --   PHOS 4.5  --  3.5  --   --  3.1  < > = values in this interval not displayed. Liver Function Tests:  Recent Labs Lab 06/27/15 0225 06/29/15 0549 06/30/15 0558  AST  --  206* 91*  ALT  --  349* 218*  ALKPHOS  --  59 57  BILITOT  --  1.1 0.6  PROT  --  4.5*  4.2*  ALBUMIN 2.1* 2.1* 2.0*   CBC:  Recent Labs Lab 06/26/15 2136 06/27/15 0225 06/28/15 0912 06/29/15 0549 06/30/15 0558  WBC 11.5* 11.9* 9.8 7.0 6.9  NEUTROABS  --   --   --   --  5.1  HGB 7.7* 7.0* 8.3* 8.2* 8.1*  HCT 22.8* 21.4* 25.7* 25.2* 25.2*  MCV 90.8 91.8 93.1 94.0 95.8  PLT 106* 68* 84* 84* 89*   Blood Culture    Component Value Date/Time   SDES BRONCHIAL ALVEOLAR LAVAGE 06/12/2015 1503   SPECREQUEST NONE 06/12/2015 1503   CULT  06/12/2015 1503    ABUNDANT ENTEROBACTER CLOACAE Note: TIGECYCLINE  R >=8 COLISTIN  .25ug/ml ETEST results for this drug are "FOR INVESTIGATIONAL USE ONLY" and should NOT be used for clinical purposes. CRE CRITICAL RESULT CALLED TO, READ BACK BY AND VERIFIED WITH: J BISHOP RN 11AM 06/16/15 GUSTK    REPTSTATUS 06/18/2015 FINAL 06/12/2015 1503   Medications:   . antiseptic oral rinse  7 mL Mouth Rinse BID  . arformoterol  15 mcg Nebulization BID  . atorvastatin  20 mg Oral q1800  . budesonide (PULMICORT) nebulizer solution  0.5  mg Nebulization BID  . chlorhexidine gluconate  15 mL Mouth Rinse BID  . darbepoetin (ARANESP) injection - DIALYSIS  200 mcg Intravenous Q Mon-HD  . doxercalciferol  2 mcg Intravenous Q M,W,F-HD  . feeding supplement (NEPRO CARB STEADY)  237 mL Oral BID BM  . feeding supplement (PRO-STAT SUGAR FREE 64)  30 mL Oral BID  . lactobacillus acidophilus  2 tablet Oral TID  . metoprolol tartrate  50 mg Oral BID  . multivitamin  1 tablet Oral QHS  . pantoprazole  40 mg Oral BID AC  . sevelamer carbonate  800 mg Oral TID WC  . sodium chloride  3 mL Intravenous Q12H

## 2015-07-01 NOTE — Consult Note (Signed)
WOC wound consult note Reason for Consult: excoriation at flexiseal site Wound type: MASD (moisture associated skin damage) Pressure Ulcer POA: /No Measurement: reddened areas over the buttocks from leakage from Flexiseal Wound bed: intact skin  Drainage (amount, consistency, odor) none Periwound: intact  Dressing procedure/placement/frequency: Discussed with bedside nurse.  Patient with flexiseal for fecal management which will limit exposure of loose stools to the affected areas. However this has been leaking and caused moisture associated skin damage.  Topical barrier cream is the best defense to the continued irritation of this skin.  Low air loss mattress in place as well which will aid in the management of the microclimate and moisture.  No other topical care needed at this time.   Discussed POC with bedside nurse.  Re consult if needed, will not follow at this time. Thanks  Lelynd Poer [place], [place] [phone])

## 2015-07-01 NOTE — Progress Notes (Signed)
Verbal handoff provided to unit social worker regarding pt plan for DC.  Plan is for pt to transfer to [facility] when medically stable  Facility has been following and hopeful to admit pending insurance auth and bed availability  This CSW signing off  Domenica Reamer, [place] Worker [phone]

## 2015-07-01 NOTE — Progress Notes (Signed)
[facility] Progress Note  Patient: Terry [name] [id number]   PCP: Colin Benton R., DO DOB: 1934-12-05   DOA: 06/07/2015   DOS: 07/01/2015   Date of Service: the patient was seen and examined on 07/01/2015  Subjective: Patient seen after hemodialysis. Appears to be more tachypneic. Also lethargic. Denies any acute complaints though. Nutrition: Was on dysphagia type III diet, Activity: Mostly bedbound Last BM: Has rectal tube  Significant Events: 12/16 intubated, L tension ptx post intubation, chest tube placed in ER  12/18 extubated 12/19 worsening distress, HD, bipap, small PTX resolved after chest tube back to suction 12/20 weak resp status remains, failed high flow nasal cannula O2 reservoir  12/21 required intubation, bronch w/ bloody clots, no DAH 12/25 extubated  1/1 warfarin resumed  1/4 UGIB - transfused - warfarin reversed 1/6 Hgb stabilized - INR normalized   Assessment and Plan: 1. HCAP (healthcare-associated pneumonia)  Sepsis with acute hypoxic respiratory failure COPD Gold 3 with acute exacerbation Tension pneumothorax after intubation Chest tube placement and removal  Presented with cough with hypoxic respiratory failure requiring intubation, ET aspirate positive for multidrug resistant Klebsiella pneumoniae as well as Enterobacter  Developed tension pneumothorax requiring chest tube.  At present chest tube has been removed, extubated, respirations status improved. 3 L nasal cannula currently. Completed antibiotic treatment per infectious disease on 06/28/2015. We will continue close monitoring on telemetry. Continue Brovana, Pulmicort nebulizer,  She was more lethargic and appears to be having some increased tachypnea as well as wheezing on exam.  Xopenex changed to Scheduled, get a chest x-ray,ABG. Escalate treatment as needed.   2. Upper GI bleed  Symptomatic anemia with acute blood loss  Hemoccult-positive, patient was on warfarin for atrial  fibrillation, warfarin has been reversed, Status post 3 PRBC. GI consulted, currently conservative management with holding anticoagulation. H&H remain stable, diet advance to dysphagia type IIIl remove flexiseal after hemodialysis  3. Chronic A. Fib, Rate is controlled, no Coumadin due to GI bleed.  4. Essential hypertension Blood pressure well controlled, continue metoprolol. Holding amlodipine.  5. ESRD on hemodialysis MWF Electrolyte management Chronic diastolic dysfunction Continue scheduled hemodialysis per nephrology. Appreciate input.  6. Diarrhea. Discontinue rectal tube,  patient has extensive skin excoriation. Wound care consulted. appreciate input  C. difficile negative. Add probiotics. Add barrier cream  DVT Prophylaxis: mechanical compression device. Nutrition: Renal diet Advance goals of care discussion: Full code  patient is not participating in physical therapy treatment, also appears to be significantly weak and lethargic. Likelyhood of acceptance at SNF Is less if the patient does not participate in physical therapy. We will discuss with palliative care to assist in discussion regarding goals of care for the patient. At present the patient is requesting pain medication for all over pain, but with his lethargy currently holding.   Brief Summary of Hospitalization:  HPI: As per the H and P dictated on admission, "patient presented from hemodialysis with complaints of cough with expectoration as well as shortness of breath. This was ongoing 2 days prior to admission, while waiting in the ER he had rapid worsening of his respiratory status requiring BiPAP followed by intubation" Procedures: Hemodialysis Echocardiogram 06/22/2015, ejection fraction [id number], diastolic dysfunction, Chest tube removal 06/19/2015 Consultants: Critical care, nephrology, gastroenterology, infectious disease.  Antibiotics: Anti-infectives    Start     Dose/Rate Route Frequency Ordered  Stop   06/25/15 2200  ciprofloxacin (CIPRO) IVPB 400 mg     400 mg 200 mL/hr over 60 Minutes Intravenous Every  24 hours 06/25/15 2124 06/28/15 2345   06/19/15 1800  meropenem (MERREM) 1 g in sodium chloride 0.9 % 100 mL IVPB    Comments:  Give after HD on dialysis days   1 g 200 mL/hr over 30 Minutes Intravenous Every 24 hours 06/18/15 1506 06/28/15 2130   06/19/15 1600  sulfamethoxazole-trimethoprim (BACTRIM) 400 mg in dextrose 5 % 500 mL IVPB  Status:  Discontinued     400 mg 350 mL/hr over 90 Minutes Intravenous Every 24 hours 06/18/15 1506 06/25/15 2119   06/18/15 1700  colistimethate ((COLYMYCIN-M)) nebulized solution 150 mg     150 mg Inhalation 2 times daily 06/18/15 1617 06/28/15 2359   06/18/15 1600  meropenem (MERREM) 1 g in sodium chloride 0.9 % 100 mL IVPB     1 g 200 mL/hr over 30 Minutes Intravenous NOW 06/18/15 1506 06/18/15 1700   06/18/15 1600  sulfamethoxazole-trimethoprim (BACTRIM) 400 mg in dextrose 5 % 500 mL IVPB    Comments:  Give after HD on dialysis days   400 mg 350 mL/hr over 90 Minutes Intravenous  Once 06/18/15 1506 06/18/15 1800   06/18/15 1600  colistimethate ((COLYMYCIN-M)) nebulized solution 75 mg  Status:  Discontinued     75 mg Inhalation 2 times daily 06/18/15 1506 06/18/15 1617   06/18/15 1200  aztreonam (AZACTAM) 250 mg in dextrose 5 % 50 mL IVPB  Status:  Discontinued    Comments:  This is a supplemental dose due to be given after each HD session   250 mg 100 mL/hr over 30 Minutes Intravenous Every T-Th-Sa (Hemodialysis) 06/17/15 0802 06/18/15 0951   06/17/15 1400  aztreonam (AZACTAM) 1 g in dextrose 5 % 50 mL IVPB  Status:  Discontinued     1 g 100 mL/hr over 30 Minutes Intravenous Every 24 hours 06/16/15 1306 06/17/15 0802   06/17/15 0900  aztreonam (AZACTAM) 500 mg in dextrose 5 % 50 mL IVPB  Status:  Discontinued     500 mg 100 mL/hr over 30 Minutes Intravenous Every 8 hours 06/17/15 0802 06/18/15 1012   06/17/15 0100  tigecycline (TYGACIL)  50 mg in sodium chloride 0.9 % 100 mL IVPB  Status:  Discontinued     50 mg 200 mL/hr over 30 Minutes Intravenous Every 12 hours 06/16/15 1154 06/18/15 0951   06/16/15 1300  tigecycline (TYGACIL) 100 mg in sodium chloride 0.9 % 100 mL IVPB     100 mg 200 mL/hr over 30 Minutes Intravenous  Once 06/16/15 1154 06/16/15 1434   06/16/15 1300  aztreonam (AZACTAM) 2 g in dextrose 5 % 50 mL IVPB     2 g 100 mL/hr over 30 Minutes Intravenous  Once 06/16/15 1214 06/16/15 1353   06/15/15 1200  cefTAZidime (FORTAZ) 2 g in dextrose 5 % 50 mL IVPB  Status:  Discontinued     2 g 100 mL/hr over 30 Minutes Intravenous Every T-Th-Sa (Hemodialysis) 06/14/15 1038 06/16/15 1154   06/14/15 1200  piperacillin-tazobactam (ZOSYN) IVPB 2.25 g  Status:  Discontinued     2.25 g 100 mL/hr over 30 Minutes Intravenous 4 times per day 06/14/15 0930 06/14/15 1038   06/14/15 1100  metroNIDAZOLE (FLAGYL) IVPB 500 mg  Status:  Discontinued     500 mg 100 mL/hr over 60 Minutes Intravenous Every 8 hours 06/14/15 1020 06/16/15 1154   06/13/15 2000  cefTAZidime (FORTAZ) 2 g in dextrose 5 % 50 mL IVPB     2 g 100 mL/hr over 30 Minutes Intravenous  Once  06/13/15 1648 06/13/15 2109   06/10/15 1700  cefTAZidime (FORTAZ) 1 g in dextrose 5 % 50 mL IVPB  Status:  Discontinued     1 g 100 mL/hr over 30 Minutes Intravenous Every 24 hours 06/10/15 1013 06/10/15 1024   06/10/15 1200  cefTAZidime (FORTAZ) 2 g in dextrose 5 % 50 mL IVPB  Status:  Discontinued     2 g 100 mL/hr over 30 Minutes Intravenous Every M-W-F (Hemodialysis) 06/10/15 1024 06/14/15 0930   06/09/15 1745  vancomycin (VANCOCIN) IVPB 1000 mg/200 mL premix     1,000 mg 200 mL/hr over 60 Minutes Intravenous  Once 06/09/15 1718 06/09/15 2105   06/08/15 1730  vancomycin (VANCOCIN) IVPB 1000 mg/200 mL premix     1,000 mg 200 mL/hr over 60 Minutes Intravenous  Once 06/08/15 1721 06/08/15 2141   06/08/15 1700  ceFEPIme (MAXIPIME) 1 g in dextrose 5 % 50 mL IVPB  Status:   Discontinued     1 g 100 mL/hr over 30 Minutes Intravenous Every 24 hours 06/07/15 1951 06/10/15 1013   06/08/15 0230  vancomycin (VANCOCIN) IVPB 1000 mg/200 mL premix     1,000 mg 200 mL/hr over 60 Minutes Intravenous NOW 06/08/15 0227 06/08/15 0349   06/07/15 1951  vancomycin (VANCOCIN) IVPB 1000 mg/200 mL premix  Status:  Discontinued     1,000 mg 200 mL/hr over 60 Minutes Intravenous Every Dialysis 06/07/15 1951 06/07/15 2248   06/07/15 1715  vancomycin (VANCOCIN) 2,000 mg in sodium chloride 0.9 % 500 mL IVPB     2,000 mg 250 mL/hr over 120 Minutes Intravenous  Once 06/07/15 1708 06/07/15 2300   06/07/15 1700  ceFEPIme (MAXIPIME) 1 g in dextrose 5 % 50 mL IVPB     1 g 100 mL/hr over 30 Minutes Intravenous  Once 06/07/15 1658 06/07/15 1834      Family Communication:  No family was present at bedside, at the time of interview.  Disposition:  Barriers to safe discharge: Improvement in respiratory status, When Stable SNF possible [facility]    Intake/Output Summary (Last 24 hours) at 07/01/15 1914 Last data filed at 07/01/15 1106  Gross per 24 hour  Intake    240 ml  Output     15 ml  Net    225 ml   Filed Weights   06/30/15 2309 07/01/15 1400 07/01/15 1815  Weight: 82 kg (180 lb 12.4 oz) 82 kg (180 lb 12.4 oz) 80.8 kg (178 lb 2.1 oz)    Objective: Physical Exam: Filed Vitals:   07/01/15 1738 07/01/15 1800 07/01/15 1815 07/01/15 1859  BP: 108/88 113/87 92/54 95/82   Pulse: 93 102 101 95  Temp:   97 F (36.1 C) 97.2 F (36.2 C)  TempSrc:   Oral Oral  Resp: 11 11 11    Height:      Weight:   80.8 kg (178 lb 2.1 oz)   SpO2:   96% 96%    General: Appear in moderate distress, no Rash; Oral Mucosa moist. Cardiovascular: S1 and S2 Present, no Murmur, no JVD Respiratory: Bilateral Air entry present and Clear to Auscultation, no Crackles, no wheezes Abdomen: Bowel Sound present, Soft and no tenderness Extremities: no Pedal edema, no calf tenderness  Data  Reviewed: CBC:  Recent Labs Lab 06/27/15 0225 06/28/15 0912 06/29/15 0549 06/30/15 0558 07/01/15 1415  WBC 11.9* 9.8 7.0 6.9 7.1  NEUTROABS  --   --   --  5.1  --   HGB 7.0* 8.3* 8.2* 8.1* 8.4*  HCT 21.4* 25.7* 25.2* 25.2* 26.8*  MCV 91.8 93.1 94.0 95.8 96.1  PLT 68* 84* 84* 89* 123456*   Basic Metabolic Panel:  Recent Labs Lab 06/25/15 0328  06/26/15 0233 06/27/15 0225 06/29/15 0549 06/30/15 0558 06/30/15 1114 07/01/15 1415  NA 134*  --  133* 137 143 135  --  135  K 6.0*  < > 5.3* 4.3 3.4* 3.6  --  3.9  CL 97*  --  94* 102 104 102  --  97*  CO2 29  --  29 26 28 25   --  28  GLUCOSE 88  --  118* 78 91 112*  --  100*  BUN 17  --  48* 33* 18 31*  --  48*  CREATININE 2.43*  --  4.01* 2.65* 2.52* 3.65*  --  5.36*  CALCIUM 7.8*  --  7.6* 7.4* 7.6* 7.3*  --  7.6*  MG 2.0  --  1.9  --   --   --   --   --   PHOS  --   --   --  3.5  --   --  3.1 4.6  < > = values in this interval not displayed. Liver Function Tests:  Recent Labs Lab 06/27/15 0225 06/29/15 0549 06/30/15 0558 07/01/15 1415  AST  --  206* 91*  --   ALT  --  349* 218*  --   ALKPHOS  --  59 57  --   BILITOT  --  1.1 0.6  --   PROT  --  4.5* 4.2*  --   ALBUMIN 2.1* 2.1* 2.0* 2.2*   BNP (last 3 results)  Recent Labs  05/13/15 2017 06/08/15 0016  BNP 362.1* 892.3*    CBG: No results for input(s): GLUCAP in the last 168 hours.  Recent Results (from the past 240 hour(s))  C difficile quick scan w PCR reflex     Status: None   Collection Time: 06/29/15  5:52 AM  Result Value Ref Range Status   C Diff antigen NEGATIVE NEGATIVE Final   C Diff toxin NEGATIVE NEGATIVE Final   C Diff interpretation Negative for toxigenic C. difficile  Final     Studies: No results found.   Scheduled Meds: . antiseptic oral rinse  7 mL Mouth Rinse BID  . arformoterol  15 mcg Nebulization BID  . atorvastatin  20 mg Oral q1800  . budesonide (PULMICORT) nebulizer solution  0.5 mg Nebulization BID  . chlorhexidine  gluconate  15 mL Mouth Rinse BID  . darbepoetin (ARANESP) injection - DIALYSIS  200 mcg Intravenous Q Mon-HD  . doxercalciferol  2 mcg Intravenous Q M,W,F-HD  . feeding supplement (NEPRO CARB STEADY)  237 mL Oral BID BM  . feeding supplement (PRO-STAT SUGAR FREE 64)  30 mL Oral BID  . lactobacillus acidophilus  2 tablet Oral TID  . levalbuterol  0.63 mg Nebulization Q6H  . multivitamin  1 tablet Oral QHS  . pantoprazole  40 mg Oral BID AC  . sevelamer carbonate  800 mg Oral TID WC  . sodium chloride  3 mL Intravenous Q12H   Continuous Infusions:  PRN Meds: acetaminophen, guaiFENesin, ondansetron, pramoxine-mineral oil-zinc, RESOURCE THICKENUP CLEAR  Time spent: 30 minutes  Author: Berle Mull, MD [facility] Pager: [phone] 07/01/2015 7:14 PM  If 7PM-7AM, please contact night-coverage at [email or web address], password [facility]

## 2015-07-02 ENCOUNTER — Encounter (HOSPITAL_COMMUNITY): Payer: Self-pay | Admitting: Primary Care

## 2015-07-02 DIAGNOSIS — J9601 Acute respiratory failure with hypoxia: Secondary | ICD-10-CM | POA: Diagnosis not present

## 2015-07-02 DIAGNOSIS — D631 Anemia in chronic kidney disease: Secondary | ICD-10-CM | POA: Diagnosis not present

## 2015-07-02 DIAGNOSIS — Z992 Dependence on renal dialysis: Secondary | ICD-10-CM | POA: Diagnosis not present

## 2015-07-02 DIAGNOSIS — Z515 Encounter for palliative care: Secondary | ICD-10-CM

## 2015-07-02 DIAGNOSIS — D62 Acute posthemorrhagic anemia: Secondary | ICD-10-CM | POA: Diagnosis not present

## 2015-07-02 DIAGNOSIS — E1122 Type 2 diabetes mellitus with diabetic chronic kidney disease: Secondary | ICD-10-CM | POA: Diagnosis not present

## 2015-07-02 DIAGNOSIS — I25700 Atherosclerosis of coronary artery bypass graft(s), unspecified, with unstable angina pectoris: Secondary | ICD-10-CM | POA: Diagnosis not present

## 2015-07-02 DIAGNOSIS — I482 Chronic atrial fibrillation: Secondary | ICD-10-CM | POA: Diagnosis not present

## 2015-07-02 DIAGNOSIS — J189 Pneumonia, unspecified organism: Secondary | ICD-10-CM | POA: Diagnosis not present

## 2015-07-02 DIAGNOSIS — N186 End stage renal disease: Secondary | ICD-10-CM | POA: Diagnosis not present

## 2015-07-02 DIAGNOSIS — N2581 Secondary hyperparathyroidism of renal origin: Secondary | ICD-10-CM | POA: Diagnosis not present

## 2015-07-02 DIAGNOSIS — D638 Anemia in other chronic diseases classified elsewhere: Secondary | ICD-10-CM | POA: Diagnosis not present

## 2015-07-02 DIAGNOSIS — I120 Hypertensive chronic kidney disease with stage 5 chronic kidney disease or end stage renal disease: Secondary | ICD-10-CM | POA: Diagnosis not present

## 2015-07-02 DIAGNOSIS — K922 Gastrointestinal hemorrhage, unspecified: Secondary | ICD-10-CM | POA: Diagnosis not present

## 2015-07-02 LAB — RENAL FUNCTION PANEL
ALBUMIN: 2.1 g/dL — AB (ref 3.5–5.0)
ANION GAP: 6 (ref 5–15)
BUN: 23 mg/dL — ABNORMAL HIGH (ref 6–20)
CALCIUM: 6.9 mg/dL — AB (ref 8.9–10.3)
CO2: 29 mmol/L (ref 22–32)
CREATININE: 3.22 mg/dL — AB (ref 0.61–1.24)
Chloride: 101 mmol/L (ref 101–111)
GFR calc non Af Amer: 17 mL/min — ABNORMAL LOW (ref >60)
GFR, EST AFRICAN AMERICAN: 20 mL/min — AB (ref >60)
GLUCOSE: 84 mg/dL (ref 65–99)
PHOSPHORUS: 3.4 mg/dL (ref 2.5–4.6)
Potassium: 3.4 mmol/L — ABNORMAL LOW (ref 3.5–5.1)
SODIUM: 136 mmol/L (ref 135–145)

## 2015-07-02 LAB — HEMOGLOBIN AND HEMATOCRIT, BLOOD
HEMATOCRIT: 25.3 % — AB (ref 39.0–52.0)
Hemoglobin: 8 g/dL — ABNORMAL LOW (ref 13.0–17.0)

## 2015-07-02 MED ORDER — ALPRAZOLAM 0.25 MG PO TABS
0.2500 mg | ORAL_TABLET | Freq: Two times a day (BID) | ORAL | Status: DC | PRN
  Filled 2015-07-02 (×2): qty 1

## 2015-07-02 MED ORDER — CLOTRIMAZOLE 10 MG MT TROC
10.0000 mg | Freq: Every day | OROMUCOSAL | Status: DC
  Administered 2015-07-02 – 2015-07-04 (×7): 10 mg via ORAL
  Filled 2015-07-02 (×16): qty 1

## 2015-07-02 MED ORDER — CLOTRIMAZOLE 10 MG MT TROC: 10.0000 mg | Freq: Every day | OROMUCOSAL | Status: DC

## 2015-07-02 MED ORDER — FENTANYL CITRATE (PF) 100 MCG/2ML IJ SOLN
25.0000 ug | Freq: Once | INTRAMUSCULAR | Status: AC | PRN
  Administered 2015-07-02: 50 ug via INTRAVENOUS
  Filled 2015-07-02: qty 2

## 2015-07-02 NOTE — Plan of Care (Signed)
Problem: Activity: Goal: Risk for activity intolerance will decrease Outcome: Progressing Pt OOB to recliner chair for one hour. 2 person assist with stand to pivot transfer.

## 2015-07-02 NOTE — Progress Notes (Signed)
[facility] Progress Note  Assessment/Plan: 1. Sepsis/ HCAP- per primary with previous intubation- s/p antiotics  2. ESRD -MWF - no heparin HD due to GIB K 3.4 1/10 - plan HD tomorrow 3. Anemia - Hgb 8  s/p 3 units pRBC on max ESA 4. Secondary hyperparathyroidism - hectorol 2- last corr Ca 6.9 (HD the prev day) corrected to 8.5 P ok renvela 1 ac 5. HTN/volume - UF volume on HD Monday not recorded 6. Nutrition - alb 2.1 soft diet/nepro/prostat/vit; sore mouth - hx of yellow dye allergy: nystatin and magic mouth wash both have listed as yellow dye - not sure what to give; try myclex troches- needs supervision and can only be given if alert 7. afib - no antiocoag 8. Diarrhea - cdiff neg, flexiseal  9. Debility - per Dr. Jonnie Finner: See PT's notes, pt continues to refuse to engage in any therapy, PT recommends pall care consult for [place]. He is doing very poorly w impending decub ulcer in the sacral area if he doesn't start getting OOB. He appears to have minimal understanding of how sick he is and is falling asleep during my conversation with him which is prob medication-related (prn klonopin, robaxin, tramadol and ambien). Called to d/w wife but no answer. I have stopped his prn sedating medications, will need to wake up and get OOB. Avoid sedating meds for now. Have consulted palliative care for [place].  Myriam Jacobson, PA-C [place] 07/02/2015,9:20 AM  LOS: 25 days   Pt seen, examined and agree w A/P as above.  Kelly Splinter MD [employer] pager [phone]    cell [phone] 07/02/2015, 1:24 PM    Subjective:   Wife said he ate about 1/2 of breakfast and 1/2 nepro and cut up is own french toast (this is new).  C/o sore mouth and swallowing. Pt remembers going to dialysis tomorrow.  Tells me flexiseal is to be removed today. Wife thinks xanax might be too much (told her it is just prn)  Objective Filed Vitals:   07/01/15 1944  07/01/15 2202 07/02/15 0419 07/02/15 0751  BP: 107/52  127/52 118/49  Pulse: 83  101 97  Temp: 97.8 F (36.6 C)  98.5 F (36.9 C) 98.2 F (36.8 C)  TempSrc: Axillary  Oral Oral  Resp: 20  18 18   Height:      Weight:      SpO2: 97% 100% 91% 96%   Physical Exam General: ill appearing, tongue red dry, dentition very poor Heart: irreg irreg Lungs: dim BS Abdomen: obese soft somewhat distended not as much as yesterday - some discomfort with palpation in the suprapubic area + BS Extremities: no sig edema; mild dependent edema in buttocks, flank Dialysis Access: right IJ and right upper AVF  Dialysis Orders: TTS [facility] 4h 2/2.25 bath 86kg Hep 8600 R IJ cath / RUE AVF(maturing) Venofer 100/ hd x 10  Hect 1 ug  Additional Objective Labs: Basic Metabolic Panel:  Recent Labs Lab 06/30/15 0558 06/30/15 1114 07/01/15 1415 07/02/15 0728  NA 135  --  135 136  K 3.6  --  3.9 3.4*  CL 102  --  97* 101  CO2 25  --  28 29  GLUCOSE 112*  --  100* 84  BUN 31*  --  48* 23*  CREATININE 3.65*  --  5.36* 3.22*  CALCIUM 7.3*  --  7.6* 6.9*  PHOS  --  3.1 4.6 3.4   Liver Function Tests:  Recent Labs Lab 06/29/15 0549 06/30/15 0558 07/01/15 1415 07/02/15 0728  AST 206* 91*  --   --   ALT 349* 218*  --   --   ALKPHOS 59 57  --   --   BILITOT 1.1 0.6  --   --   PROT 4.5* 4.2*  --   --   ALBUMIN 2.1* 2.0* 2.2* 2.1*   CBC:  Recent Labs Lab 06/27/15 0225 06/28/15 0912 06/29/15 0549 06/30/15 0558 07/01/15 1415 07/02/15 0728  WBC 11.9* 9.8 7.0 6.9 7.1  --   NEUTROABS  --   --   --  5.1  --   --   HGB 7.0* 8.3* 8.2* 8.1* 8.4* 8.0*  HCT 21.4* 25.7* 25.2* 25.2* 26.8* 25.3*  MCV 91.8 93.1 94.0 95.8 96.1  --   PLT 68* 84* 84* 89* 104*  --    Blood Culture    Component Value Date/Time   SDES BRONCHIAL ALVEOLAR LAVAGE 06/12/2015 1503   SPECREQUEST NONE 06/12/2015 1503   CULT  06/12/2015 1503    ABUNDANT ENTEROBACTER CLOACAE Note: TIGECYCLINE  R >=8 COLISTIN  .25ug/ml  ETEST results for this drug are "FOR INVESTIGATIONAL USE ONLY" and should NOT be used for clinical purposes. CRE CRITICAL RESULT CALLED TO, READ BACK BY AND VERIFIED WITH: J BISHOP RN 11AM 06/16/15 GUSTK    REPTSTATUS 06/18/2015 FINAL 06/12/2015 1503    Studies/Results: Dg Chest Port 1 View  07/01/2015  CLINICAL DATA:  Shortness of breath.  Wheezing and lethargy. EXAM: PORTABLE CHEST 1 VIEW COMPARISON:  Most recent radiograph 06/19/2015, chest CT 06/13/2015 FINDINGS: Right-sided dialysis catheter remains in place, tip at the atrial caval junction. Cardiomegaly mediastinal contours are unchanged. Retrocardiac opacity is ill-defined, likely atelectasis, with slight worsening in the interim. Improving right lung base aeration. The hazy right mid lung zone opacity on prior exams partially obscured by overlying monitoring devices, less well-defined. No evident pneumothorax or pleural effusion. IMPRESSION: 1. Interval worsening of left lung base aeration with improved opacity in the right lower lung zone. 2. Previous hazy opacity in the right midlung zone is again seen, less well-defined and partially obscured by overlying monitoring devices. Electronically Signed   By: Jeb Levering M.D.   On: 07/01/2015 19:46   Medications:   . antiseptic oral rinse  7 mL Mouth Rinse BID  . arformoterol  15 mcg Nebulization BID  . atorvastatin  20 mg Oral q1800  . budesonide (PULMICORT) nebulizer solution  0.5 mg Nebulization BID  . chlorhexidine gluconate  15 mL Mouth Rinse BID  . darbepoetin (ARANESP) injection - DIALYSIS  200 mcg Intravenous Q Mon-HD  . doxercalciferol  2 mcg Intravenous Q M,W,F-HD  . feeding supplement (NEPRO CARB STEADY)  237 mL Oral BID BM  . feeding supplement (PRO-STAT SUGAR FREE 64)  30 mL Oral BID  . lactobacillus acidophilus  2 tablet Oral TID  . levalbuterol  0.63 mg Nebulization Q6H  . multivitamin  1 tablet Oral QHS  . pantoprazole  40 mg Oral BID AC  . sevelamer carbonate  800 mg  Oral TID WC  . sodium chloride  3 mL Intravenous Q12H

## 2015-07-02 NOTE — Progress Notes (Signed)
   07/02/15 1200  Clinical Encounter Type  Visited With Family  Visit Type Follow-up  Spiritual Encounters  Spiritual Needs Emotional  Stress Factors  Family Stress Factors Exhausted;Major life changes  Talked to patient's wife in hall. She hoped to go back to work today but was waiting for a visit from the doctor. She was dressed and groomed and very appreciative of my visit yesterday. Expressed hope that husband was making good progress now.

## 2015-07-02 NOTE — Progress Notes (Signed)
[facility] Progress Note  Patient: Terry [name] [id number]   PCP: Colin Benton R., DO DOB: 02/17/1935   DOA: 06/07/2015   DOS: 07/02/2015   Date of Service: the patient was seen and examined on 07/02/2015  Subjective: Patient appears to be anxious about removal of the rectal tube, due to pain associated with the insertion procedure. Discussed with the patient that he needs to continue working with physical therapy to improve his physical strength and refusing it can disqualify him from placement at a rehabilitation facility. Also discussed with the patient to be able to continue hemodialysis in the recliner instead of bed. Patient mentions he cannot perform above mentioned function due to rectal tube in place, patient agreed for removal of the rectal tube as well as working with physical therapy as well as attempt to get hemodialysis in a recliner. Nutrition: Was on dysphagia type III diet, Activity: Mostly bedbound Last BM: Has rectal tube  Significant Events: 12/16 intubated, L tension ptx post intubation, chest tube placed in ER  12/18 extubated 12/19 worsening distress, HD, bipap, small PTX resolved after chest tube back to suction 12/20 weak resp status remains, failed high flow nasal cannula O2 reservoir  12/21 required intubation, bronch w/ bloody clots, no DAH 12/25 extubated  1/1 warfarin resumed  1/4 UGIB - transfused - warfarin reversed 1/6 Hgb stabilized - INR normalized  07/02/2015, rectal tube removed  Assessment and Plan: 1. HCAP (healthcare-associated pneumonia)  Sepsis with acute hypoxic respiratory failure COPD Gold 3 with acute exacerbation Tension pneumothorax after intubation Chest tube placement and removal  Presented with cough with hypoxic respiratory failure requiring intubation, ET aspirate positive for multidrug resistant Klebsiella pneumoniae as well as Enterobacter  Developed tension pneumothorax requiring chest tube.  At present chest tube  has been removed, extubated, respirations status improved. 3 L nasal cannula. I observed patient saturating 96% on 1 L nasal cannula notified nurse.  Completed antibiotic treatment per infectious disease on 06/28/2015. Repeat chest x-ray shows no acute abnormality on clinical evaluation although the report states increase in haziness on the right side. We will continue close monitoring on telemetry. Continue Brovana, Pulmicort nebulizer, Xopenex to scheduled.  2. Upper GI bleed  Symptomatic anemia with acute blood loss  Hemoccult-positive, patient was on warfarin for atrial fibrillation, warfarin has been reversed, Status post 3 PRBC. GI consulted, currently conservative management with holding anticoagulation. H&H remain stable, diet advance to dysphagia type IIIl  3. Chronic A. Fib, Rate is controlled, no Coumadin due to GI bleed.  4. Essential hypertension Blood pressure was softer after dialysis Holding amlodipine, metoprolol  5. ESRD on hemodialysis MWF Electrolyte management Chronic diastolic dysfunction Continue scheduled hemodialysis per nephrology. Appreciate input.  6. Diarrhea. Discontinue rectal tube,  patient has extensive skin excoriation. Wound care consulted. appreciate input  C. difficile negative. Add probiotics. Add barrier cream  7. Anxiety. Patient is anxious and agitated, Starting the patient on Xanax twice a day as needed.  DVT Prophylaxis: mechanical compression device. Nutrition: Renal diet Advance goals of care discussion: Full code As documented above the patient is currently agreeing with participation with physical therapy as well as continuing hemodialysis in a recliner. He appears to be more anxious with pain associated with removal of the rectal tube. Rectal tube has been removed.  Would hold off on palliative care consultation until patient has hemodialysis on 07/03/2015 as well as reevaluation by physical therapy on 07/03/2015, to monitor  progression. Patient was 96% on 1 L nasal cannula  this morning, I believe that the patient has a potential to recover from his current acute presentation as long as he remains compliant with therapy and dialysis. Also family and patient needs more education about role of palliative care.  Brief Summary of Hospitalization:  HPI: As per the H and P dictated on admission, "patient presented from hemodialysis with complaints of cough with expectoration as well as shortness of breath. This was ongoing 2 days prior to admission, while waiting in the ER he had rapid worsening of his respiratory status requiring BiPAP followed by intubation" Procedures: Hemodialysis Echocardiogram 06/22/2015, ejection fraction [id number], diastolic dysfunction, Chest tube removal 06/19/2015 Consultants: Critical care, nephrology, gastroenterology, infectious disease.  Antibiotics: Anti-infectives    Start     Dose/Rate Route Frequency Ordered Stop   06/25/15 2200  ciprofloxacin (CIPRO) IVPB 400 mg     400 mg 200 mL/hr over 60 Minutes Intravenous Every 24 hours 06/25/15 2124 06/28/15 2345   06/19/15 1800  meropenem (MERREM) 1 g in sodium chloride 0.9 % 100 mL IVPB    Comments:  Give after HD on dialysis days   1 g 200 mL/hr over 30 Minutes Intravenous Every 24 hours 06/18/15 1506 06/28/15 2130   06/19/15 1600  sulfamethoxazole-trimethoprim (BACTRIM) 400 mg in dextrose 5 % 500 mL IVPB  Status:  Discontinued     400 mg 350 mL/hr over 90 Minutes Intravenous Every 24 hours 06/18/15 1506 06/25/15 2119   06/18/15 1700  colistimethate ((COLYMYCIN-M)) nebulized solution 150 mg     150 mg Inhalation 2 times daily 06/18/15 1617 06/28/15 2359   06/18/15 1600  meropenem (MERREM) 1 g in sodium chloride 0.9 % 100 mL IVPB     1 g 200 mL/hr over 30 Minutes Intravenous NOW 06/18/15 1506 06/18/15 1700   06/18/15 1600  sulfamethoxazole-trimethoprim (BACTRIM) 400 mg in dextrose 5 % 500 mL IVPB    Comments:  Give after HD on dialysis  days   400 mg 350 mL/hr over 90 Minutes Intravenous  Once 06/18/15 1506 06/18/15 1800   06/18/15 1600  colistimethate ((COLYMYCIN-M)) nebulized solution 75 mg  Status:  Discontinued     75 mg Inhalation 2 times daily 06/18/15 1506 06/18/15 1617   06/18/15 1200  aztreonam (AZACTAM) 250 mg in dextrose 5 % 50 mL IVPB  Status:  Discontinued    Comments:  This is a supplemental dose due to be given after each HD session   250 mg 100 mL/hr over 30 Minutes Intravenous Every T-Th-Sa (Hemodialysis) 06/17/15 0802 06/18/15 0951   06/17/15 1400  aztreonam (AZACTAM) 1 g in dextrose 5 % 50 mL IVPB  Status:  Discontinued     1 g 100 mL/hr over 30 Minutes Intravenous Every 24 hours 06/16/15 1306 06/17/15 0802   06/17/15 0900  aztreonam (AZACTAM) 500 mg in dextrose 5 % 50 mL IVPB  Status:  Discontinued     500 mg 100 mL/hr over 30 Minutes Intravenous Every 8 hours 06/17/15 0802 06/18/15 1012   06/17/15 0100  tigecycline (TYGACIL) 50 mg in sodium chloride 0.9 % 100 mL IVPB  Status:  Discontinued     50 mg 200 mL/hr over 30 Minutes Intravenous Every 12 hours 06/16/15 1154 06/18/15 0951   06/16/15 1300  tigecycline (TYGACIL) 100 mg in sodium chloride 0.9 % 100 mL IVPB     100 mg 200 mL/hr over 30 Minutes Intravenous  Once 06/16/15 1154 06/16/15 1434   06/16/15 1300  aztreonam (AZACTAM) 2 g in dextrose 5 % 50  mL IVPB     2 g 100 mL/hr over 30 Minutes Intravenous  Once 06/16/15 1214 06/16/15 1353   06/15/15 1200  cefTAZidime (FORTAZ) 2 g in dextrose 5 % 50 mL IVPB  Status:  Discontinued     2 g 100 mL/hr over 30 Minutes Intravenous Every T-Th-Sa (Hemodialysis) 06/14/15 1038 06/16/15 1154   06/14/15 1200  piperacillin-tazobactam (ZOSYN) IVPB 2.25 g  Status:  Discontinued     2.25 g 100 mL/hr over 30 Minutes Intravenous 4 times per day 06/14/15 0930 06/14/15 1038   06/14/15 1100  metroNIDAZOLE (FLAGYL) IVPB 500 mg  Status:  Discontinued     500 mg 100 mL/hr over 60 Minutes Intravenous Every 8 hours 06/14/15  1020 06/16/15 1154   06/13/15 2000  cefTAZidime (FORTAZ) 2 g in dextrose 5 % 50 mL IVPB     2 g 100 mL/hr over 30 Minutes Intravenous  Once 06/13/15 1648 06/13/15 2109   06/10/15 1700  cefTAZidime (FORTAZ) 1 g in dextrose 5 % 50 mL IVPB  Status:  Discontinued     1 g 100 mL/hr over 30 Minutes Intravenous Every 24 hours 06/10/15 1013 06/10/15 1024   06/10/15 1200  cefTAZidime (FORTAZ) 2 g in dextrose 5 % 50 mL IVPB  Status:  Discontinued     2 g 100 mL/hr over 30 Minutes Intravenous Every M-W-F (Hemodialysis) 06/10/15 1024 06/14/15 0930   06/09/15 1745  vancomycin (VANCOCIN) IVPB 1000 mg/200 mL premix     1,000 mg 200 mL/hr over 60 Minutes Intravenous  Once 06/09/15 1718 06/09/15 2105   06/08/15 1730  vancomycin (VANCOCIN) IVPB 1000 mg/200 mL premix     1,000 mg 200 mL/hr over 60 Minutes Intravenous  Once 06/08/15 1721 06/08/15 2141   06/08/15 1700  ceFEPIme (MAXIPIME) 1 g in dextrose 5 % 50 mL IVPB  Status:  Discontinued     1 g 100 mL/hr over 30 Minutes Intravenous Every 24 hours 06/07/15 1951 06/10/15 1013   06/08/15 0230  vancomycin (VANCOCIN) IVPB 1000 mg/200 mL premix     1,000 mg 200 mL/hr over 60 Minutes Intravenous NOW 06/08/15 0227 06/08/15 0349   06/07/15 1951  vancomycin (VANCOCIN) IVPB 1000 mg/200 mL premix  Status:  Discontinued     1,000 mg 200 mL/hr over 60 Minutes Intravenous Every Dialysis 06/07/15 1951 06/07/15 2248   06/07/15 1715  vancomycin (VANCOCIN) 2,000 mg in sodium chloride 0.9 % 500 mL IVPB     2,000 mg 250 mL/hr over 120 Minutes Intravenous  Once 06/07/15 1708 06/07/15 2300   06/07/15 1700  ceFEPIme (MAXIPIME) 1 g in dextrose 5 % 50 mL IVPB     1 g 100 mL/hr over 30 Minutes Intravenous  Once 06/07/15 1658 06/07/15 1834      Family Communication:  wife was present at bedside, at the time of interview. All questions were answered satisfactorily  Disposition:  Barriers to safe discharge: Improvement in respiratory status, When Stable SNF possible  [place]    Intake/Output Summary (Last 24 hours) at 07/02/15 1635 Last data filed at 07/02/15 0600  Gross per 24 hour  Intake    444 ml  Output      0 ml  Net    444 ml   Filed Weights   06/30/15 2309 07/01/15 1400 07/01/15 1815  Weight: 82 kg (180 lb 12.4 oz) 82 kg (180 lb 12.4 oz) 80.8 kg (178 lb 2.1 oz)    Objective: Physical Exam: Filed Vitals:   07/01/15 2202 07/02/15  [id number] 07/02/15 0751 07/02/15 1630  BP:  127/52 118/49   Pulse:  101 97   Temp:  98.5 F (36.9 C) 98.2 F (36.8 C)   TempSrc:  Oral Oral   Resp:  18 18   Height:      Weight:      SpO2: 100% 91% 96% 96%    General: Appear in moderate distress, no Rash; Oral Mucosa moist. Cardiovascular: S1 and S2 Present, no Murmur, no JVD Respiratory: Bilateral Air entry present and Clear to Auscultation, no Crackles, no wheezes Abdomen: Bowel Sound present, Soft and no tenderness Extremities: no Pedal edema, no calf tenderness  Data Reviewed: CBC:  Recent Labs Lab 06/27/15 0225 06/28/15 0912 06/29/15 0549 06/30/15 0558 07/01/15 1415 07/02/15 0728  WBC 11.9* 9.8 7.0 6.9 7.1  --   NEUTROABS  --   --   --  5.1  --   --   HGB 7.0* 8.3* 8.2* 8.1* 8.4* 8.0*  HCT 21.4* 25.7* 25.2* 25.2* 26.8* 25.3*  MCV 91.8 93.1 94.0 95.8 96.1  --   PLT 68* 84* 84* 89* 104*  --    Basic Metabolic Panel:  Recent Labs Lab 06/26/15 0233 06/27/15 0225 06/29/15 0549 06/30/15 0558 06/30/15 1114 07/01/15 1415 07/02/15 0728  NA 133* 137 143 135  --  135 136  K 5.3* 4.3 3.4* 3.6  --  3.9 3.4*  CL 94* 102 104 102  --  97* 101  CO2 29 26 28 25   --  28 29  GLUCOSE 118* 78 91 112*  --  100* 84  BUN 48* 33* 18 31*  --  48* 23*  CREATININE 4.01* 2.65* 2.52* 3.65*  --  5.36* 3.22*  CALCIUM 7.6* 7.4* 7.6* 7.3*  --  7.6* 6.9*  MG 1.9  --   --   --   --   --   --   PHOS  --  3.5  --   --  3.1 4.6 3.4   Liver Function Tests:  Recent Labs Lab 06/27/15 0225 06/29/15 0549 06/30/15 0558 07/01/15 1415 07/02/15 0728  AST  --   206* 91*  --   --   ALT  --  349* 218*  --   --   ALKPHOS  --  59 57  --   --   BILITOT  --  1.1 0.6  --   --   PROT  --  4.5* 4.2*  --   --   ALBUMIN 2.1* 2.1* 2.0* 2.2* 2.1*   BNP (last 3 results)  Recent Labs  05/13/15 2017 06/08/15 0016  BNP 362.1* 892.3*    CBG: No results for input(s): GLUCAP in the last 168 hours.  Recent Results (from the past 240 hour(s))  C difficile quick scan w PCR reflex     Status: None   Collection Time: 06/29/15  5:52 AM  Result Value Ref Range Status   C Diff antigen NEGATIVE NEGATIVE Final   C Diff toxin NEGATIVE NEGATIVE Final   C Diff interpretation Negative for toxigenic C. difficile  Final     Studies: Dg Chest Port 1 View  07/01/2015  CLINICAL DATA:  Shortness of breath.  Wheezing and lethargy. EXAM: PORTABLE CHEST 1 VIEW COMPARISON:  Most recent radiograph 06/19/2015, chest CT 06/13/2015 FINDINGS: Right-sided dialysis catheter remains in place, tip at the atrial caval junction. Cardiomegaly mediastinal contours are unchanged. Retrocardiac opacity is ill-defined, likely atelectasis, with slight worsening in the interim. Improving right lung base aeration.  The hazy right mid lung zone opacity on prior exams partially obscured by overlying monitoring devices, less well-defined. No evident pneumothorax or pleural effusion. IMPRESSION: 1. Interval worsening of left lung base aeration with improved opacity in the right lower lung zone. 2. Previous hazy opacity in the right midlung zone is again seen, less well-defined and partially obscured by overlying monitoring devices. Electronically Signed   By: Jeb Levering M.D.   On: 07/01/2015 19:46     Scheduled Meds: . antiseptic oral rinse  7 mL Mouth Rinse BID  . arformoterol  15 mcg Nebulization BID  . atorvastatin  20 mg Oral q1800  . budesonide (PULMICORT) nebulizer solution  0.5 mg Nebulization BID  . chlorhexidine gluconate  15 mL Mouth Rinse BID  . clotrimazole  10 mg Oral 5 X Daily  .  darbepoetin (ARANESP) injection - DIALYSIS  200 mcg Intravenous Q Mon-HD  . doxercalciferol  2 mcg Intravenous Q M,W,F-HD  . feeding supplement (NEPRO CARB STEADY)  237 mL Oral BID BM  . feeding supplement (PRO-STAT SUGAR FREE 64)  30 mL Oral BID  . lactobacillus acidophilus  2 tablet Oral TID  . levalbuterol  0.63 mg Nebulization Q6H  . multivitamin  1 tablet Oral QHS  . pantoprazole  40 mg Oral BID AC  . sevelamer carbonate  800 mg Oral TID WC  . sodium chloride  3 mL Intravenous Q12H   Continuous Infusions:  PRN Meds: acetaminophen, ALPRAZolam, guaiFENesin, ondansetron, pramoxine-mineral oil-zinc, RESOURCE THICKENUP CLEAR  Time spent: 30 minutes  Author: Berle Mull, MD [facility] Pager: [phone] 07/02/2015 4:35 PM  If 7PM-7AM, please contact night-coverage at [email or web address], password [facility]

## 2015-07-02 NOTE — Progress Notes (Signed)
Physical Therapy Treatment Patient Details Name: Terry [name] MRN: [id number] DOB: 03/11/1935 Today's Date: 07/02/2015    History of Present Illness pt is a 81 y/o male with h/o stroke, HTN, COPD, CAD, afib and OA, admitted with 2 day h/o SOB with condition deteriorating in HD.12/21 on endotrachial tube, extubated 12/25. Chest tube removed 12/28.    PT Comments    Pt with flexiseal removed and willing to participate in PT. Pt able to complete stand pvt to chair this date with max encouragement and assist x 2. Pt reports "i'm willing to work now that i don't have something shoved up my butt.". PT increased freq to 3x/wk to further progress pt's mobility.  Follow Up Recommendations  SNF     Equipment Recommendations       Recommendations for Other Services       Precautions / Restrictions Precautions Precautions: Fall Restrictions Weight Bearing Restrictions: No    Mobility  Bed Mobility Overal bed mobility: Needs Assistance Bed Mobility: Supine to Sit     Supine to sit: Min assist     General bed mobility comments: v/c's to bring LEs off EOB and to reach across body to pull on bed rail. pt then able to push self up , minA to prevent sliding off bed  Transfers Overall transfer level: Needs assistance Equipment used:  (2 person lift with gait belt) Transfers: Sit to/from [facility] Sit to Stand: Mod assist;+2 physical assistance Stand pivot transfers: Mod assist;+2 physical assistance       General transfer comment: max encouragement required, pt initially modAx2 to achieve standing due to LE weakness, once in standing pt minAx2. modAx2 for std pvt to chair, pt with report of SOB but didn't seem dyspnic  Ambulation/Gait             General Gait Details: limited to 4 steps to chair this date   Stairs            Wheelchair Mobility    Modified Rankin (Stroke Patients Only)       Balance Overall balance assessment: Needs  assistance Sitting-balance support: Feet supported;Bilateral upper extremity supported Sitting balance-Leahy Scale: Fair Sitting balance - Comments: sat EOB x 7 min to complete ther ex   Standing balance support: Bilateral upper extremity supported Standing balance-Leahy Scale: Poor Standing balance comment: needs external support                    Cognition Arousal/Alertness: Awake/alert Behavior During Therapy: Agitated (easily irritated) Overall Cognitive Status: Impaired/Different from baseline Area of Impairment: Safety/judgement;Awareness         Safety/Judgement: Decreased awareness of safety Awareness: Intellectual   General Comments: pt self limiting with decrease insight to medical condition and what he is capable of    Exercises General Exercises - Lower Extremity Ankle Circles/Pumps: AROM;Both;10 reps;Supine Quad Sets: AROM;Both;10 reps;Supine Long Arc Quad: AROM;Both;10 reps;Seated Hip ABduction/ADduction: AROM;Both;10 reps;Supine Hip Flexion/Marching: AROM;Both;10 reps;Seated    General Comments        Pertinent Vitals/Pain Pain Assessment: No/denies pain    Home Living                      Prior Function            PT Goals (current goals can now be found in the care plan section) Progress towards PT goals: Progressing toward goals    Frequency  Min 3X/week    PT Plan Frequency needs to be  updated;Current plan remains appropriate    Co-evaluation             End of Session Equipment Utilized During Treatment: Gait belt;Oxygen Activity Tolerance: Patient tolerated treatment well Patient left: in chair;with call bell/phone within reach     Time: [id number] PT Time Calculation (min) (ACUTE ONLY): 23 min  Charges:  $Therapeutic Exercise: 8-22 mins $Therapeutic Activity: 8-22 mins                    G Codes:      Kingsley Callander 07/02/2015, 4:55 PM  Kittie Plater, PT, DPT Pager #: [phone] Office #:  [phone]

## 2015-07-02 NOTE — Plan of Care (Signed)
Face to face meeting today with Terry [name].  He will only make brief eye contact.   I ask if he wants the blinds raised ( the room is very dark) he angrily tells me that he is watching a movie.  We talk about the role of palliative medicine.  He states "well talk", but again, sounds angry.  He asks 'what's this about?" and I share that PMT is a support for his wishes.  I ask where he sees himself in 3 months, 6 months, one year.  He does not respond.    When I ask about talking to his wife, he tells me that she is at work and states, "come back at 7".   His sister arrives, and she is very solicitous towards him.  She tells me that she works for hospice and understand Palliative medicine.    I let [name] know that we will come visit with him tomorrow.

## 2015-07-03 ENCOUNTER — Encounter: Payer: Medicare PPO | Admitting: Vascular Surgery

## 2015-07-03 ENCOUNTER — Encounter (HOSPITAL_COMMUNITY): Payer: Medicare PPO

## 2015-07-03 DIAGNOSIS — J9601 Acute respiratory failure with hypoxia: Secondary | ICD-10-CM | POA: Diagnosis not present

## 2015-07-03 DIAGNOSIS — Z992 Dependence on renal dialysis: Secondary | ICD-10-CM | POA: Diagnosis not present

## 2015-07-03 DIAGNOSIS — I5032 Chronic diastolic (congestive) heart failure: Secondary | ICD-10-CM

## 2015-07-03 DIAGNOSIS — D631 Anemia in chronic kidney disease: Secondary | ICD-10-CM | POA: Diagnosis not present

## 2015-07-03 DIAGNOSIS — N2581 Secondary hyperparathyroidism of renal origin: Secondary | ICD-10-CM | POA: Diagnosis not present

## 2015-07-03 DIAGNOSIS — D638 Anemia in other chronic diseases classified elsewhere: Secondary | ICD-10-CM

## 2015-07-03 DIAGNOSIS — I120 Hypertensive chronic kidney disease with stage 5 chronic kidney disease or end stage renal disease: Secondary | ICD-10-CM | POA: Diagnosis not present

## 2015-07-03 DIAGNOSIS — N186 End stage renal disease: Secondary | ICD-10-CM | POA: Diagnosis not present

## 2015-07-03 DIAGNOSIS — J150 Pneumonia due to Klebsiella pneumoniae: Secondary | ICD-10-CM | POA: Diagnosis not present

## 2015-07-03 DIAGNOSIS — D62 Acute posthemorrhagic anemia: Secondary | ICD-10-CM | POA: Diagnosis not present

## 2015-07-03 DIAGNOSIS — J189 Pneumonia, unspecified organism: Secondary | ICD-10-CM | POA: Diagnosis not present

## 2015-07-03 DIAGNOSIS — K922 Gastrointestinal hemorrhage, unspecified: Secondary | ICD-10-CM | POA: Diagnosis not present

## 2015-07-03 DIAGNOSIS — E1122 Type 2 diabetes mellitus with diabetic chronic kidney disease: Secondary | ICD-10-CM | POA: Diagnosis not present

## 2015-07-03 LAB — CBC WITH DIFFERENTIAL/PLATELET
Basophils Absolute: 0 10*3/uL (ref 0.0–0.1)
Basophils Relative: 0 %
EOS PCT: 9 %
Eosinophils Absolute: 0.6 10*3/uL (ref 0.0–0.7)
HCT: 27 % — ABNORMAL LOW (ref 39.0–52.0)
Hemoglobin: 8.6 g/dL — ABNORMAL LOW (ref 13.0–17.0)
LYMPHS ABS: 0.7 10*3/uL (ref 0.7–4.0)
LYMPHS PCT: 11 %
MCH: 30.2 pg (ref 26.0–34.0)
MCHC: 31.9 g/dL (ref 30.0–36.0)
MCV: 94.7 fL (ref 78.0–100.0)
MONO ABS: 0.6 10*3/uL (ref 0.1–1.0)
MONOS PCT: 10 %
Neutro Abs: 4.5 10*3/uL (ref 1.7–7.7)
Neutrophils Relative %: 71 %
PLATELETS: 111 10*3/uL — AB (ref 150–400)
RBC: 2.85 MIL/uL — ABNORMAL LOW (ref 4.22–5.81)
RDW: 18 % — AB (ref 11.5–15.5)
WBC: 6.4 10*3/uL (ref 4.0–10.5)

## 2015-07-03 LAB — RENAL FUNCTION PANEL
Albumin: 2.1 g/dL — ABNORMAL LOW (ref 3.5–5.0)
Anion gap: 10 (ref 5–15)
BUN: 30 mg/dL — AB (ref 6–20)
CHLORIDE: 95 mmol/L — AB (ref 101–111)
CO2: 27 mmol/L (ref 22–32)
CREATININE: 4.1 mg/dL — AB (ref 0.61–1.24)
Calcium: 7.5 mg/dL — ABNORMAL LOW (ref 8.9–10.3)
GFR calc Af Amer: 15 mL/min — ABNORMAL LOW (ref >60)
GFR calc non Af Amer: 13 mL/min — ABNORMAL LOW (ref >60)
Glucose, Bld: 89 mg/dL (ref 65–99)
Phosphorus: 3.1 mg/dL (ref 2.5–4.6)
Potassium: 3.1 mmol/L — ABNORMAL LOW (ref 3.5–5.1)
Sodium: 132 mmol/L — ABNORMAL LOW (ref 135–145)

## 2015-07-03 MED ORDER — HEPARIN SODIUM (PORCINE) 1000 UNIT/ML DIALYSIS: 1000.0000 [IU] | INTRAMUSCULAR | Status: DC | PRN

## 2015-07-03 MED ORDER — LIDOCAINE-PRILOCAINE 2.5-2.5 % EX CREA
1.0000 "application " | TOPICAL_CREAM | CUTANEOUS | Status: DC | PRN
  Filled 2015-07-03: qty 5

## 2015-07-03 MED ORDER — SODIUM CHLORIDE 0.9 % IV SOLN: 100.0000 mL | INTRAVENOUS | Status: DC | PRN

## 2015-07-03 MED ORDER — ALTEPLASE 2 MG IJ SOLR
2.0000 mg | Freq: Once | INTRAMUSCULAR | Status: DC | PRN
  Filled 2015-07-03: qty 2

## 2015-07-03 MED ORDER — METHOCARBAMOL 500 MG PO TABS
500.0000 mg | ORAL_TABLET | Freq: Three times a day (TID) | ORAL | Status: DC | PRN
  Administered 2015-07-03: 500 mg via ORAL
  Filled 2015-07-03: qty 1

## 2015-07-03 MED ORDER — DOXERCALCIFEROL 4 MCG/2ML IV SOLN
INTRAVENOUS | Status: AC
  Filled 2015-07-03: qty 2

## 2015-07-03 MED ORDER — METOPROLOL TARTRATE 25 MG PO TABS
25.0000 mg | ORAL_TABLET | Freq: Two times a day (BID) | ORAL | Status: DC
  Administered 2015-07-03 – 2015-07-04 (×3): 25 mg via ORAL
  Filled 2015-07-03 (×3): qty 1

## 2015-07-03 MED ORDER — PENTAFLUOROPROP-TETRAFLUOROETH EX AERO: 1.0000 "application " | INHALATION_SPRAY | CUTANEOUS | Status: DC | PRN

## 2015-07-03 MED ORDER — LIDOCAINE HCL (PF) 1 % IJ SOLN: 5.0000 mL | INTRAMUSCULAR | Status: DC | PRN

## 2015-07-03 NOTE — Consult Note (Signed)
   [facility] CM Inpatient Consult   07/03/2015  ANKIT KOPECKY [birth year] [id number]    Patient evaluated for [place] [facility] services. Spoke with inpatient RNCM. Patient likely to go to SNF. Will not engage for [facility] services at this time.  Marthenia Rolling, MSN-Ed, RN,BSN [facility] Liaison [phone]

## 2015-07-03 NOTE — Progress Notes (Signed)
Occupational Therapy Treatment Patient Details Name: Terry [name] MRN: [id number] DOB: 1934-11-03 Today's Date: 07/03/2015    History of present illness pt is a 81 y/o male with h/o stroke, HTN, COPD, CAD, afib and OA, admitted with 2 day h/o SOB with condition deteriorating in HD.12/21 on endotrachial tube, extubated 12/25. Chest tube removed 12/28.   OT comments  Pt completed bed mobility, short ambulation to sink level , static standing at sink level for 45 seconds and static sitting in recliner. Pt needed encouragement but progressing toward goals.   Follow Up Recommendations  SNF    Equipment Recommendations  Wheelchair cushion (measurements OT);Wheelchair (measurements OT);3 in 1 bedside comode;Hospital bed    Recommendations for Other Services      Precautions / Restrictions Precautions Precautions: Fall Restrictions Weight Bearing Restrictions: No       Mobility Bed Mobility Overal bed mobility: Needs Assistance Bed Mobility: Supine to Sit     Supine to sit: Min assist     General bed mobility comments: v/c's to bring LEs off EOB and to reach across body to pull on bed rail. pt then able to push self up , minA to prevent sliding off bed  Transfers Overall transfer level: Needs assistance Equipment used: Rolling walker (2 wheeled) Transfers: Sit to/from Stand Sit to Stand: Mod assist;+2 physical assistance         General transfer comment: v/c's for hand placement, increased difficulty with initial transfer, once in standing minAx2. pt needs help to power up into standing    Balance Overall balance assessment: Needs assistance Sitting-balance support: Bilateral upper extremity supported;Feet supported Sitting balance-Leahy Scale: Fair     Standing balance support: Bilateral upper extremity supported;During functional activity Standing balance-Leahy Scale: Poor Standing balance comment: leaning heavily on sink surface                   ADL  Overall ADL's : Needs assistance/impaired     Grooming: Oral care;Minimal assistance;Sitting Grooming Details (indicate cue type and reason): pt static standing and initiating oral care in standing. pt sitting impulsively and finishing task in sitting with mod v/c for encouragement. Pt terminating task prematurely                             Functional mobility during ADLs: +2 for physical assistance;Moderate assistance General ADL Comments: pt needed to be given two options and to choose which one he would participate in. Pt informed the goal of session was to reach sink level. Pt once at sink states "i can't" Pt provided oral care and comb and asked which item do you want to do this session? Pt picks oral care and engages in task. pT very upset about discussion of leaving [facility] acute care and sitting in a chair for HD. pt wife arriving and discussing care with RN staff.      Vision                     Perception     Praxis      Cognition   Behavior During Therapy: Anxious Overall Cognitive Status: Impaired/Different from baseline Area of Impairment: Safety/judgement;Awareness          Safety/Judgement: Decreased awareness of safety;Decreased awareness of deficits Awareness: Emergent   General Comments: impulsive and sitting without warning. pt with ability to tolerate standing but sitting despite awareness of chair positioning    Extremity/Trunk Assessment  Exercises     Shoulder Instructions       General Comments      Pertinent Vitals/ Pain       Pain Assessment: No/denies pain  Home Living                                          Prior Functioning/Environment              Frequency Min 2X/week     Progress Toward Goals  OT Goals(current goals can now be found in the care plan section)  Progress towards OT goals: Progressing toward goals  Acute Rehab OT Goals Patient Stated Goal: to go someone  that has slow progressing therapy this is all too fast OT Goal Formulation: With patient/family Time For Goal Achievement: 07/17/15 Potential to Achieve Goals: Good ADL Goals Pt Will Perform Grooming: with set-up;with supervision;sitting Pt Will Transfer to Toilet: with min assist;ambulating;bedside commode Additional ADL Goal #1: Pt will be [place] A up to EOB for OOB activties Additional ADL Goal #2: Pt will be independent in resistive UB HEP.  Plan Discharge plan remains appropriate    Co-evaluation    PT/OT/SLP Co-Evaluation/Treatment: Yes Reason for Co-Treatment: Complexity of the patient's impairments (multi-system involvement);For patient/therapist safety PT goals addressed during session: Mobility/safety with mobility OT goals addressed during session: ADL's and self-care      End of Session Equipment Utilized During Treatment: Oxygen   Activity Tolerance Patient tolerated treatment well   Patient Left in chair;with call bell/phone within reach;with chair alarm set;with family/visitor present   Nurse Communication Mobility status;Precautions        Time: 1141-1210 OT Time Calculation (min): 29 min  Charges: OT General Charges $OT Visit: 1 Procedure OT Treatments $Self Care/Home Management : 8-22 mins  Parke Poisson B 07/03/2015, 1:37 PM   Jeri Modena   OTR/L Pager: [phone] Office: [phone] .

## 2015-07-03 NOTE — Progress Notes (Signed)
[facility] Progress Note  Assessment/Plan: 1. Sepsis/ HCAP- per primary with previous intubation- s/p antiotics  2. ESRD -MWF - no heparin HD due to GIB K 3.1 1/11 4K bath  - plan HD today 3. Anemia - Hgb 8.6 stable s/p 3 units pRBC on max ESA 4. Secondary hyperparathyroidism - hectorol 2- corr Ca ok P ok renvela 1 ac 5. HTN/volume - UF volume on HD Monday not recorded 6. Nutrition - alb 2.1 renal diet/nepro/prostat/vit; sore mouth - hx of yellow dye allergy: nystatin and magic mouth wash both have listed as yellow dye - not sure what to give; mouth better - said he had one mycelex troch 7. afib - no antiocoag 8. Diarrhea - cdiff neg, flexiseal removed 9. Debility - more participatory with PT yesterday now that flexiseal is out - need to see if he can tolerate HD in a recliner if goal is for SNF; he doesn't think he can tolerate full treatment in recliner - consider LTAC - have discussed with case manager  Myriam Jacobson, PA-C [place] [phone] 07/03/2015,10:07 AM  LOS: 26 days   Pt seen, examined and agree w A/P as above.  Kelly Splinter MD [facility] pager [phone]    cell [phone] 07/03/2015, 2:50 PM    Subjective:  Feeding self oatmeal, drinking milk; mouth better and bottom not as sore, just weak  Objective Filed Vitals:   07/03/15 0159 07/03/15 0417 07/03/15 0420 07/03/15 0726  BP:  157/55    Pulse:  108  99  Temp:  98.8 F (37.1 C)    TempSrc:  Oral    Resp:  20  18  Height:      Weight:   85.276 kg (188 lb)   SpO2: 95% 90%     Physical Exam General: NAD Heart: RRR Lungs: grossly clear Abdomen: soft NT Extremities: no LE edema Dialysis Access: right IJ and right upper AVF  Dialysis Orders: TTS [facility] 4h 2/2.25 bath 86kg Hep 8600 R IJ cath / RUE AVF(maturing) Venofer 100/ hd x 10  Hect 1 ug   Additional Objective Labs: Basic Metabolic Panel:  Recent Labs Lab 07/01/15 1415  07/02/15 0728 07/03/15 0523  NA 135 136 132*  K 3.9 3.4* 3.1*  CL 97* 101 95*  CO2 28 29 27   GLUCOSE 100* 84 89  BUN 48* 23* 30*  CREATININE 5.36* 3.22* 4.10*  CALCIUM 7.6* 6.9* 7.5*  PHOS 4.6 3.4 3.1   Liver Function Tests:  Recent Labs Lab 06/29/15 0549 06/30/15 0558 07/01/15 1415 07/02/15 0728 07/03/15 0523  AST 206* 91*  --   --   --   ALT 349* 218*  --   --   --   ALKPHOS 59 57  --   --   --   BILITOT 1.1 0.6  --   --   --   PROT 4.5* 4.2*  --   --   --   ALBUMIN 2.1* 2.0* 2.2* 2.1* 2.1*   CBC:  Recent Labs Lab 06/28/15 0912 06/29/15 0549 06/30/15 0558 07/01/15 1415 07/02/15 0728 07/03/15 0523  WBC 9.8 7.0 6.9 7.1  --  6.4  NEUTROABS  --   --  5.1  --   --  4.5  HGB 8.3* 8.2* 8.1* 8.4* 8.0* 8.6*  HCT 25.7* 25.2* 25.2* 26.8* 25.3* 27.0*  MCV 93.1 94.0 95.8 96.1  --  94.7  PLT 84* 84* 89* 104*  --  111*  Studies/Results: Dg Chest  Port 1 View  07/01/2015  CLINICAL DATA:  Shortness of breath.  Wheezing and lethargy. EXAM: PORTABLE CHEST 1 VIEW COMPARISON:  Most recent radiograph 06/19/2015, chest CT 06/13/2015 FINDINGS: Right-sided dialysis catheter remains in place, tip at the atrial caval junction. Cardiomegaly mediastinal contours are unchanged. Retrocardiac opacity is ill-defined, likely atelectasis, with slight worsening in the interim. Improving right lung base aeration. The hazy right mid lung zone opacity on prior exams partially obscured by overlying monitoring devices, less well-defined. No evident pneumothorax or pleural effusion. IMPRESSION: 1. Interval worsening of left lung base aeration with improved opacity in the right lower lung zone. 2. Previous hazy opacity in the right midlung zone is again seen, less well-defined and partially obscured by overlying monitoring devices. Electronically Signed   By: Jeb Levering M.D.   On: 07/01/2015 19:46   Medications:   . antiseptic oral rinse  7 mL Mouth Rinse BID  . arformoterol  15 mcg Nebulization BID   . atorvastatin  20 mg Oral q1800  . budesonide (PULMICORT) nebulizer solution  0.5 mg Nebulization BID  . chlorhexidine gluconate  15 mL Mouth Rinse BID  . clotrimazole  10 mg Oral 5 X Daily  . darbepoetin (ARANESP) injection - DIALYSIS  200 mcg Intravenous Q Mon-HD  . doxercalciferol  2 mcg Intravenous Q M,W,F-HD  . feeding supplement (NEPRO CARB STEADY)  237 mL Oral BID BM  . feeding supplement (PRO-STAT SUGAR FREE 64)  30 mL Oral BID  . lactobacillus acidophilus  2 tablet Oral TID  . levalbuterol  0.63 mg Nebulization Q6H  . multivitamin  1 tablet Oral QHS  . pantoprazole  40 mg Oral BID AC  . sevelamer carbonate  800 mg Oral TID WC  . sodium chloride  3 mL Intravenous Q12H

## 2015-07-03 NOTE — Progress Notes (Signed)
Nutrition Follow-up  DOCUMENTATION CODES:   Not applicable  INTERVENTION:  Continue Nepro Shake po BID, each supplement provides 425 kcal and 19 grams protein.  Continue 30 ml Prostat po BID, each supplement provides 100 kcal and 15 grams of protein.   Encourage adequate PO intake.   NUTRITION DIAGNOSIS:   Inadequate oral intake related to dysphagia, poor appetite as evidenced by  (meal completion </= 50%); improving  GOAL:   Patient will meet greater than or equal to 90% of their needs; progressing  MONITOR:   PO intake, Supplement acceptance, Labs, Weight trends, I & O's  REASON FOR ASSESSMENT:   Consult Enteral/tube feeding initiation and management  ASSESSMENT:   81 y/o male PMhx Stroke, HTN, ESRD-newly on HD, CHF, COPD, CAD, Acute/chronic resp failure, PNA, A FIB who presents after having worsening SOB w/ cough. Was only able to tolerate 1 hr of HD yesterday. Transported from dialysis to ED and subsequently intubated for resp failure. Was hospitalized 11/21-12/13 w/ AoC RENAL failure and was started on HD at that time. This admission he is worked up for HCAP.   Meal completion has been varied from 25-100%. Appetite good. Pt currently has Nepro Shake and Prostat ordered. Pt has been consuming his Prostat, however Nepro Shake consumption has been more varied. RD to continue with current orders to aid in caloric and protein needs.   Labs and medications reviewed.   Diet Order:  Diet renal with fluid restriction Fluid restriction:: 1200 mL Fluid; Room service appropriate?: Yes; Fluid consistency:: Thin  Skin:  Reviewed, no issues  Last BM:  1/9  Height:   Ht Readings from Last 1 Encounters:  06/17/15 5\' 7"  (1.702 m)    Weight:   Wt Readings from Last 1 Encounters:  07/03/15 188 lb (85.276 kg)    Ideal Body Weight:  72.73 kg  BMI:  Body mass index is 29.44 kg/(m^2).  Estimated Nutritional Needs:   Kcal:  [id number]  Protein:  90-110 grams  Fluid:  1.2  L/day  EDUCATION NEEDS:   No education needs identified at this time  Corrin Parker, MS, RD, LDN Pager # [phone] After hours/ weekend pager # [phone]

## 2015-07-03 NOTE — Progress Notes (Signed)
[facility] Progress Note   THIJS KOOL  [id number]  DOB: 1934/12/08  DOA: 06/07/2015 PCP: [name], DO  Brief narrative: ADESH FREIDEL is a 81 y.o. male with end-stage renal disease on hemodialysis who presented from dialysis with a complaint of cough. The patient required intubation shortly after being brought to the hospital and then subsequently suffered a tension pneumothorax requiring a chest tube. ETT aspirate grew out multidrug resistant Klebsiella pneumonia and Enterobacter-he completed antibiotics on 1/6. He subsequently developed melena on 1/4 and warfarin was reversed. He was given 3 units of packed red blood cells as well. His hemoglobin has since stabilized.   Subjective: States he feels weak. Again refusing to be dialyzed in a chair today. No complaints of cough or shortness of breath. Has a good appetite. No diarrhea. Unable to go to rehabilitation facility.  Assessment/Plan: Principal Problem:   HCAP (healthcare-associated pneumonia) - Acute respiratory failure with hypoxia --Sepsis  -Resolved-completed treatment  Active Problems:   COPD GOLD III -Weaning O2 as able -Continue Brovana, Pulmicort, Xopenex -Resume Spiriva    CAD (coronary artery disease) of artery bypass graft -Continue Lipitor     Essential hypertension -Continue metoprolol    Chronic atrial fibrillation (HCC) -Currently not a candidate for Coumadin due to upper GI bleed -Continue metoprolol for rate control    End-stage renal disease on hemodialysis -Continue to dialyze per nephrology    Pneumothorax, left -Resolved-chest tube removed    Chronic diastolic heart failure  -Fluid management with dialysis    Anemia of chronic disease/ Acute blood loss anemia/acute upper GI bleed -Status post 3 units of packed red blood cells -GI felt that he was too high risk for EGD-continue Protonix 40 mg twice a day -Hemoglobin remaining steady at 8-9  range   Antibiotics: Anti-infectives    Start     Dose/Rate Route Frequency Ordered Stop   06/25/15 2200  ciprofloxacin (CIPRO) IVPB 400 mg     400 mg 200 mL/hr over 60 Minutes Intravenous Every 24 hours 06/25/15 2124 06/28/15 2345   06/19/15 1800  meropenem (MERREM) 1 g in sodium chloride 0.9 % 100 mL IVPB    Comments:  Give after HD on dialysis days   1 g 200 mL/hr over 30 Minutes Intravenous Every 24 hours 06/18/15 1506 06/28/15 2130   06/19/15 1600  sulfamethoxazole-trimethoprim (BACTRIM) 400 mg in dextrose 5 % 500 mL IVPB  Status:  Discontinued     400 mg 350 mL/hr over 90 Minutes Intravenous Every 24 hours 06/18/15 1506 06/25/15 2119   06/18/15 1700  colistimethate ((COLYMYCIN-M)) nebulized solution 150 mg     150 mg Inhalation 2 times daily 06/18/15 1617 06/28/15 2359   06/18/15 1600  meropenem (MERREM) 1 g in sodium chloride 0.9 % 100 mL IVPB     1 g 200 mL/hr over 30 Minutes Intravenous NOW 06/18/15 1506 06/18/15 1700   06/18/15 1600  sulfamethoxazole-trimethoprim (BACTRIM) 400 mg in dextrose 5 % 500 mL IVPB    Comments:  Give after HD on dialysis days   400 mg 350 mL/hr over 90 Minutes Intravenous  Once 06/18/15 1506 06/18/15 1800   06/18/15 1600  colistimethate ((COLYMYCIN-M)) nebulized solution 75 mg  Status:  Discontinued     75 mg Inhalation 2 times daily 06/18/15 1506 06/18/15 1617   06/18/15 1200  aztreonam (AZACTAM) 250 mg in dextrose 5 % 50 mL IVPB  Status:  Discontinued    Comments:  This is a supplemental dose due to  be given after each HD session   250 mg 100 mL/hr over 30 Minutes Intravenous Every T-Th-Sa (Hemodialysis) 06/17/15 0802 06/18/15 0951   06/17/15 1400  aztreonam (AZACTAM) 1 g in dextrose 5 % 50 mL IVPB  Status:  Discontinued     1 g 100 mL/hr over 30 Minutes Intravenous Every 24 hours 06/16/15 1306 06/17/15 0802   06/17/15 0900  aztreonam (AZACTAM) 500 mg in dextrose 5 % 50 mL IVPB  Status:  Discontinued     500 mg 100 mL/hr over 30 Minutes  Intravenous Every 8 hours 06/17/15 0802 06/18/15 1012   06/17/15 0100  tigecycline (TYGACIL) 50 mg in sodium chloride 0.9 % 100 mL IVPB  Status:  Discontinued     50 mg 200 mL/hr over 30 Minutes Intravenous Every 12 hours 06/16/15 1154 06/18/15 0951   06/16/15 1300  tigecycline (TYGACIL) 100 mg in sodium chloride 0.9 % 100 mL IVPB     100 mg 200 mL/hr over 30 Minutes Intravenous  Once 06/16/15 1154 06/16/15 1434   06/16/15 1300  aztreonam (AZACTAM) 2 g in dextrose 5 % 50 mL IVPB     2 g 100 mL/hr over 30 Minutes Intravenous  Once 06/16/15 1214 06/16/15 1353   06/15/15 1200  cefTAZidime (FORTAZ) 2 g in dextrose 5 % 50 mL IVPB  Status:  Discontinued     2 g 100 mL/hr over 30 Minutes Intravenous Every T-Th-Sa (Hemodialysis) 06/14/15 1038 06/16/15 1154   06/14/15 1200  piperacillin-tazobactam (ZOSYN) IVPB 2.25 g  Status:  Discontinued     2.25 g 100 mL/hr over 30 Minutes Intravenous 4 times per day 06/14/15 0930 06/14/15 1038   06/14/15 1100  metroNIDAZOLE (FLAGYL) IVPB 500 mg  Status:  Discontinued     500 mg 100 mL/hr over 60 Minutes Intravenous Every 8 hours 06/14/15 1020 06/16/15 1154   06/13/15 2000  cefTAZidime (FORTAZ) 2 g in dextrose 5 % 50 mL IVPB     2 g 100 mL/hr over 30 Minutes Intravenous  Once 06/13/15 1648 06/13/15 2109   06/10/15 1700  cefTAZidime (FORTAZ) 1 g in dextrose 5 % 50 mL IVPB  Status:  Discontinued     1 g 100 mL/hr over 30 Minutes Intravenous Every 24 hours 06/10/15 1013 06/10/15 1024   06/10/15 1200  cefTAZidime (FORTAZ) 2 g in dextrose 5 % 50 mL IVPB  Status:  Discontinued     2 g 100 mL/hr over 30 Minutes Intravenous Every M-W-F (Hemodialysis) 06/10/15 1024 06/14/15 0930   06/09/15 1745  vancomycin (VANCOCIN) IVPB 1000 mg/200 mL premix     1,000 mg 200 mL/hr over 60 Minutes Intravenous  Once 06/09/15 1718 06/09/15 2105   06/08/15 1730  vancomycin (VANCOCIN) IVPB 1000 mg/200 mL premix     1,000 mg 200 mL/hr over 60 Minutes Intravenous  Once 06/08/15 1721  06/08/15 2141   06/08/15 1700  ceFEPIme (MAXIPIME) 1 g in dextrose 5 % 50 mL IVPB  Status:  Discontinued     1 g 100 mL/hr over 30 Minutes Intravenous Every 24 hours 06/07/15 1951 06/10/15 1013   06/08/15 0230  vancomycin (VANCOCIN) IVPB 1000 mg/200 mL premix     1,000 mg 200 mL/hr over 60 Minutes Intravenous NOW 06/08/15 0227 06/08/15 0349   06/07/15 1951  vancomycin (VANCOCIN) IVPB 1000 mg/200 mL premix  Status:  Discontinued     1,000 mg 200 mL/hr over 60 Minutes Intravenous Every Dialysis 06/07/15 1951 06/07/15 2248   06/07/15 1715  vancomycin (VANCOCIN) 2,000  mg in sodium chloride 0.9 % 500 mL IVPB     2,000 mg 250 mL/hr over 120 Minutes Intravenous  Once 06/07/15 1708 06/07/15 2300   06/07/15 1700  ceFEPIme (MAXIPIME) 1 g in dextrose 5 % 50 mL IVPB     1 g 100 mL/hr over 30 Minutes Intravenous  Once 06/07/15 1658 06/07/15 1834     Code Status:     Code Status Orders        Start     Ordered   06/08/15 0212  Full code   Continuous     06/08/15 0212    Code Status History    Date Active Date Inactive Code Status Order ID Comments User Context   06/07/2015  7:32 PM 06/08/2015  2:12 AM Full Code [id number]  Luella Cook, MD ED   05/14/2015  1:16 AM 06/04/2015  6:47 PM Full Code [id number]  Edwin Dada, MD ED   04/13/2014  1:45 AM 04/20/2014  7:47 PM Full Code [id number]  Ivor Costa, MD Inpatient     Family Communication:  Disposition Plan: W skilled nursing facility tomorrow DVT prophylaxis: SCDs-avoiding anticoagulants due to GI bleed Consultants: GI, nephrology, critical care Procedures:  2-D echo: ejection fraction [id number], diastolic dysfunction, Chest tube    Objective: Filed Weights   07/01/15 1815 07/02/15 2051 07/03/15 0420  Weight: 80.8 kg (178 lb 2.1 oz) 85.276 kg (188 lb) 85.276 kg (188 lb)    Intake/Output Summary (Last 24 hours) at 07/03/15 1054 Last data filed at 07/02/15 2000  Gross per 24 hour  Intake      0 ml  Output      0 ml  Net       0 ml     Vitals Filed Vitals:   07/03/15 0159 07/03/15 0417 07/03/15 0420 07/03/15 0726  BP:  157/55    Pulse:  108  99  Temp:  98.8 F (37.1 C)    TempSrc:  Oral    Resp:  20  18  Height:      Weight:   85.276 kg (188 lb)   SpO2: 95% 90%      Exam:  General:  Pt is alert, not in acute distress  HEENT: No icterus, No thrush, oral mucosa moist  Cardiovascular: regular rate and rhythm, S1/S2 No murmur  Respiratory: clear to auscultation bilaterally   Abdomen: Soft, +Bowel sounds, non tender, non distended, no guarding  MSK: No cyanosis or clubbing- no pedal edema   Data Reviewed: Basic Metabolic Panel:  Recent Labs Lab 06/27/15 0225 06/29/15 0549 06/30/15 0558 06/30/15 1114 07/01/15 1415 07/02/15 0728 07/03/15 0523  NA 137 143 135  --  135 136 132*  K 4.3 3.4* 3.6  --  3.9 3.4* 3.1*  CL 102 104 102  --  97* 101 95*  CO2 26 28 25   --  28 29 27   GLUCOSE 78 91 112*  --  100* 84 89  BUN 33* 18 31*  --  48* 23* 30*  CREATININE 2.65* 2.52* 3.65*  --  5.36* 3.22* 4.10*  CALCIUM 7.4* 7.6* 7.3*  --  7.6* 6.9* 7.5*  PHOS 3.5  --   --  3.1 4.6 3.4 3.1   Liver Function Tests:  Recent Labs Lab 06/29/15 0549 06/30/15 0558 07/01/15 1415 07/02/15 0728 07/03/15 0523  AST 206* 91*  --   --   --   ALT 349* 218*  --   --   --   Arabella Merles  59 57  --   --   --   BILITOT 1.1 0.6  --   --   --   PROT 4.5* 4.2*  --   --   --   ALBUMIN 2.1* 2.0* 2.2* 2.1* 2.1*   No results for input(s): LIPASE, AMYLASE in the last 168 hours. No results for input(s): AMMONIA in the last 168 hours. CBC:  Recent Labs Lab 06/28/15 0912 06/29/15 0549 06/30/15 0558 07/01/15 1415 07/02/15 0728 07/03/15 0523  WBC 9.8 7.0 6.9 7.1  --  6.4  NEUTROABS  --   --  5.1  --   --  4.5  HGB 8.3* 8.2* 8.1* 8.4* 8.0* 8.6*  HCT 25.7* 25.2* 25.2* 26.8* 25.3* 27.0*  MCV 93.1 94.0 95.8 96.1  --  94.7  PLT 84* 84* 89* 104*  --  111*   Cardiac Enzymes: No results for input(s): CKTOTAL, CKMB,  CKMBINDEX, TROPONINI in the last 168 hours. BNP (last 3 results)  Recent Labs  05/13/15 2017 06/08/15 0016  BNP 362.1* 892.3*    ProBNP (last 3 results) No results for input(s): PROBNP in the last 8760 hours.  CBG: No results for input(s): GLUCAP in the last 168 hours.  Recent Results (from the past 240 hour(s))  C difficile quick scan w PCR reflex     Status: None   Collection Time: 06/29/15  5:52 AM  Result Value Ref Range Status   C Diff antigen NEGATIVE NEGATIVE Final   C Diff toxin NEGATIVE NEGATIVE Final   C Diff interpretation Negative for toxigenic C. difficile  Final     Studies: Dg Chest Port 1 View  07/01/2015  CLINICAL DATA:  Shortness of breath.  Wheezing and lethargy. EXAM: PORTABLE CHEST 1 VIEW COMPARISON:  Most recent radiograph 06/19/2015, chest CT 06/13/2015 FINDINGS: Right-sided dialysis catheter remains in place, tip at the atrial caval junction. Cardiomegaly mediastinal contours are unchanged. Retrocardiac opacity is ill-defined, likely atelectasis, with slight worsening in the interim. Improving right lung base aeration. The hazy right mid lung zone opacity on prior exams partially obscured by overlying monitoring devices, less well-defined. No evident pneumothorax or pleural effusion. IMPRESSION: 1. Interval worsening of left lung base aeration with improved opacity in the right lower lung zone. 2. Previous hazy opacity in the right midlung zone is again seen, less well-defined and partially obscured by overlying monitoring devices. Electronically Signed   By: Jeb Levering M.D.   On: 07/01/2015 19:46    Scheduled Meds:  Scheduled Meds: . antiseptic oral rinse  7 mL Mouth Rinse BID  . arformoterol  15 mcg Nebulization BID  . atorvastatin  20 mg Oral q1800  . budesonide (PULMICORT) nebulizer solution  0.5 mg Nebulization BID  . chlorhexidine gluconate  15 mL Mouth Rinse BID  . clotrimazole  10 mg Oral 5 X Daily  . darbepoetin (ARANESP) injection -  DIALYSIS  200 mcg Intravenous Q Mon-HD  . doxercalciferol  2 mcg Intravenous Q M,W,F-HD  . feeding supplement (NEPRO CARB STEADY)  237 mL Oral BID BM  . feeding supplement (PRO-STAT SUGAR FREE 64)  30 mL Oral BID  . lactobacillus acidophilus  2 tablet Oral TID  . levalbuterol  0.63 mg Nebulization Q6H  . multivitamin  1 tablet Oral QHS  . pantoprazole  40 mg Oral BID AC  . sevelamer carbonate  800 mg Oral TID WC  . sodium chloride  3 mL Intravenous Q12H   Continuous Infusions:   Time spent on care of  this patient: 108 min   [place], MD 07/03/2015, 10:54 AM  LOS: 26 days   [facility] Office  [phone] Pager - Text Page per [email or web address] If 7PM-7AM, please contact night-coverage [email or web address]

## 2015-07-03 NOTE — Progress Notes (Signed)
Physical Therapy Treatment Patient Details Name: Terry [name] MRN: [id number] DOB: 04/06/1935 Today's Date: 07/03/2015    History of Present Illness pt is a 81 y/o male with h/o stroke, HTN, COPD, CAD, afib and OA, admitted with 2 day h/o SOB with condition deteriorating in HD.12/21 on endotrachial tube, extubated 12/25. Chest tube removed 12/28.    PT Comments    Pt extremely anxious and requires maximal encouragement to participate and push self from mobility stand point. Pt very distracted this date due to MD ordering pt to have HD in recliner not bed. Pt did amb 3 steps however requires modA x2 for safety and walker management. Pt cont with severe deconditioning, impaired balance and high anxiety requiring assist for all mobility. SpO2 at 89% on 1 LO2 via [place] during amb. Con't to recommend SNF.  Follow Up Recommendations  SNF     Equipment Recommendations       Recommendations for Other Services       Precautions / Restrictions Precautions Precautions: Fall Restrictions Weight Bearing Restrictions: No    Mobility  Bed Mobility Overal bed mobility: Needs Assistance Bed Mobility: Supine to Sit     Supine to sit: Min assist     General bed mobility comments: v/c's to bring LEs off EOB and to reach across body to pull on bed rail. pt then able to push self up , minA to prevent sliding off bed  Transfers Overall transfer level: Needs assistance Equipment used: Rolling walker (2 wheeled) Transfers: Sit to/from Stand Sit to Stand: Mod assist;+2 physical assistance         General transfer comment: v/c's for hand placement, increased difficulty with initiall transfer, once in standing minAx2  Ambulation/Gait Ambulation/Gait assistance: Mod assist;+2 physical assistance;+2 safety/equipment Ambulation Distance (Feet): 3 Feet Assistive device: Rolling walker (2 wheeled) Gait Pattern/deviations: Step-to pattern;Shuffle;Wide base of support Gait velocity: extremely  slow Gait velocity interpretation: Below normal speed for age/gender General Gait Details: max encouragement given, close chair follow assist for RW management, + SOB, increased anxiety, impulsively sat down in chair dsepite v/c's not to stating "my legs are just so week"   Stairs            Wheelchair Mobility    Modified Rankin (Stroke Patients Only)       Balance Overall balance assessment: Needs assistance Sitting-balance support: Feet supported Sitting balance-Leahy Scale: Fair                              Cognition Arousal/Alertness: Awake/alert Behavior During Therapy: Anxious Overall Cognitive Status: Impaired/Different from baseline Area of Impairment: Safety/judgement;Awareness         Safety/Judgement: Decreased awareness of safety;Decreased awareness of deficits Awareness: Emergent   General Comments: pt impulsively sitting despite v/c's    Exercises      General Comments        Pertinent Vitals/Pain Pain Assessment: No/denies pain    Home Living                      Prior Function            PT Goals (current goals can now be found in the care plan section) Progress towards PT goals: Progressing toward goals    Frequency  Min 3X/week    PT Plan Current plan remains appropriate    Co-evaluation PT/OT/SLP Co-Evaluation/Treatment: Yes Reason for Co-Treatment: For patient/therapist safety PT goals addressed during session:  Mobility/safety with mobility       End of Session Equipment Utilized During Treatment: Gait belt;Oxygen Activity Tolerance: Patient limited by fatigue (limited by anxiety) Patient left: in chair;with call bell/phone within reach     Time: 1150-1223 PT Time Calculation (min) (ACUTE ONLY): 33 min  Charges:  $Gait Training: 8-22 mins                    G Codes:      Kingsley Callander 07/03/2015, 12:25 PM   Kittie Plater, PT, DPT Pager #: [phone] Office #: [phone]

## 2015-07-03 NOTE — Progress Notes (Signed)
Speech Language Pathology Treatment: Dysphagia  Patient Details Name: Terry [name] MRN: [id number] DOB: [birth year] Today's Date: 07/03/2015 Time: 9446-1558 SLP Time Calculation (min) (ACUTE ONLY): 10 min  Assessment / Plan / Recommendation Clinical Impression  Pt repositioned himself in bed to allow for more upright posture prior to PO intake. He declined offerings of solid foods as he said he either could not chew them without his dentures or simply did not want them. He did consume thin liquids without overt signs of difficulty. He remains afebrile per chart, lung sounds unchanged. When asked, he voices no concerns about his swallowing. No further SLP intervention necessary for swallowing.   HPI HPI: 81yo male with hx ESRD, AFib, COPD, CVA presented 12/16 to the ED with a 2 day history of progressive SOB and cough with yellow sputum. Had worsening respiratory status while at HD and EMS was called. Diagnosed with PNA vs COPD exacerbation vs CHF exacerbation. Intubated 12/16-12/18, then again 12/21-12/25.      SLP Plan  All goals met     Recommendations  Diet recommendations: Dysphagia 3 (mechanical soft);Thin liquid Liquids provided via: Cup Medication Administration: Crushed with puree Supervision: Patient able to self feed;Full supervision/cueing for compensatory strategies Compensations: Slow rate;Small sips/bites Postural Changes and/or Swallow Maneuvers: Seated upright 90 degrees              Oral Care Recommendations: Oral care BID Follow up Recommendations: None Plan: All goals met   Germain Osgood, M.A. CCC-SLP [phone]  Germain Osgood 07/03/2015, 10:50 AM

## 2015-07-03 NOTE — Progress Notes (Signed)
Pt tolerated full HD session in chair.

## 2015-07-04 DIAGNOSIS — Z955 Presence of coronary angioplasty implant and graft: Secondary | ICD-10-CM | POA: Diagnosis not present

## 2015-07-04 DIAGNOSIS — J449 Chronic obstructive pulmonary disease, unspecified: Secondary | ICD-10-CM | POA: Diagnosis not present

## 2015-07-04 DIAGNOSIS — I2510 Atherosclerotic heart disease of native coronary artery without angina pectoris: Secondary | ICD-10-CM | POA: Diagnosis present

## 2015-07-04 DIAGNOSIS — I5032 Chronic diastolic (congestive) heart failure: Secondary | ICD-10-CM | POA: Diagnosis present

## 2015-07-04 DIAGNOSIS — M7989 Other specified soft tissue disorders: Secondary | ICD-10-CM | POA: Diagnosis present

## 2015-07-04 DIAGNOSIS — M6281 Muscle weakness (generalized): Secondary | ICD-10-CM | POA: Diagnosis not present

## 2015-07-04 DIAGNOSIS — R918 Other nonspecific abnormal finding of lung field: Secondary | ICD-10-CM | POA: Diagnosis not present

## 2015-07-04 DIAGNOSIS — D500 Iron deficiency anemia secondary to blood loss (chronic): Secondary | ICD-10-CM | POA: Diagnosis not present

## 2015-07-04 DIAGNOSIS — A419 Sepsis, unspecified organism: Secondary | ICD-10-CM | POA: Diagnosis not present

## 2015-07-04 DIAGNOSIS — D631 Anemia in chronic kidney disease: Secondary | ICD-10-CM | POA: Diagnosis present

## 2015-07-04 DIAGNOSIS — J156 Pneumonia due to other aerobic Gram-negative bacteria: Secondary | ICD-10-CM | POA: Diagnosis not present

## 2015-07-04 DIAGNOSIS — J9621 Acute and chronic respiratory failure with hypoxia: Secondary | ICD-10-CM | POA: Diagnosis present

## 2015-07-04 DIAGNOSIS — R05 Cough: Secondary | ICD-10-CM | POA: Diagnosis not present

## 2015-07-04 DIAGNOSIS — Z820 Family history of epilepsy and other diseases of the nervous system: Secondary | ICD-10-CM | POA: Diagnosis not present

## 2015-07-04 DIAGNOSIS — Y95 Nosocomial condition: Secondary | ICD-10-CM | POA: Diagnosis present

## 2015-07-04 DIAGNOSIS — J189 Pneumonia, unspecified organism: Secondary | ICD-10-CM | POA: Diagnosis not present

## 2015-07-04 DIAGNOSIS — Z992 Dependence on renal dialysis: Secondary | ICD-10-CM | POA: Diagnosis not present

## 2015-07-04 DIAGNOSIS — N2581 Secondary hyperparathyroidism of renal origin: Secondary | ICD-10-CM | POA: Diagnosis present

## 2015-07-04 DIAGNOSIS — R0989 Other specified symptoms and signs involving the circulatory and respiratory systems: Secondary | ICD-10-CM | POA: Diagnosis not present

## 2015-07-04 DIAGNOSIS — R2681 Unsteadiness on feet: Secondary | ICD-10-CM | POA: Diagnosis not present

## 2015-07-04 DIAGNOSIS — K922 Gastrointestinal hemorrhage, unspecified: Secondary | ICD-10-CM | POA: Diagnosis not present

## 2015-07-04 DIAGNOSIS — J9601 Acute respiratory failure with hypoxia: Secondary | ICD-10-CM | POA: Diagnosis not present

## 2015-07-04 DIAGNOSIS — J150 Pneumonia due to Klebsiella pneumoniae: Secondary | ICD-10-CM | POA: Diagnosis not present

## 2015-07-04 DIAGNOSIS — Z809 Family history of malignant neoplasm, unspecified: Secondary | ICD-10-CM | POA: Diagnosis not present

## 2015-07-04 DIAGNOSIS — Z8249 Family history of ischemic heart disease and other diseases of the circulatory system: Secondary | ICD-10-CM | POA: Diagnosis not present

## 2015-07-04 DIAGNOSIS — Z8673 Personal history of transient ischemic attack (TIA), and cerebral infarction without residual deficits: Secondary | ICD-10-CM | POA: Diagnosis not present

## 2015-07-04 DIAGNOSIS — D638 Anemia in other chronic diseases classified elsewhere: Secondary | ICD-10-CM | POA: Diagnosis present

## 2015-07-04 DIAGNOSIS — I120 Hypertensive chronic kidney disease with stage 5 chronic kidney disease or end stage renal disease: Secondary | ICD-10-CM | POA: Diagnosis not present

## 2015-07-04 DIAGNOSIS — I10 Essential (primary) hypertension: Secondary | ICD-10-CM | POA: Diagnosis not present

## 2015-07-04 DIAGNOSIS — J440 Chronic obstructive pulmonary disease with acute lower respiratory infection: Secondary | ICD-10-CM | POA: Diagnosis present

## 2015-07-04 DIAGNOSIS — I132 Hypertensive heart and chronic kidney disease with heart failure and with stage 5 chronic kidney disease, or end stage renal disease: Secondary | ICD-10-CM | POA: Diagnosis present

## 2015-07-04 DIAGNOSIS — N186 End stage renal disease: Secondary | ICD-10-CM | POA: Diagnosis present

## 2015-07-04 DIAGNOSIS — R0602 Shortness of breath: Secondary | ICD-10-CM | POA: Diagnosis present

## 2015-07-04 DIAGNOSIS — R069 Unspecified abnormalities of breathing: Secondary | ICD-10-CM | POA: Diagnosis not present

## 2015-07-04 DIAGNOSIS — I4891 Unspecified atrial fibrillation: Secondary | ICD-10-CM | POA: Diagnosis not present

## 2015-07-04 DIAGNOSIS — Z87891 Personal history of nicotine dependence: Secondary | ICD-10-CM | POA: Diagnosis not present

## 2015-07-04 DIAGNOSIS — E1122 Type 2 diabetes mellitus with diabetic chronic kidney disease: Secondary | ICD-10-CM | POA: Diagnosis present

## 2015-07-04 DIAGNOSIS — I482 Chronic atrial fibrillation: Secondary | ICD-10-CM | POA: Diagnosis present

## 2015-07-04 DIAGNOSIS — D62 Acute posthemorrhagic anemia: Secondary | ICD-10-CM | POA: Diagnosis not present

## 2015-07-04 LAB — MRSA PCR SCREENING: MRSA BY PCR: POSITIVE — AB

## 2015-07-04 MED ORDER — CHLORHEXIDINE GLUCONATE CLOTH 2 % EX PADS: 6.0000 | MEDICATED_PAD | Freq: Every day | CUTANEOUS | Status: DC

## 2015-07-04 MED ORDER — PANTOPRAZOLE SODIUM 40 MG PO TBEC: 40.0000 mg | DELAYED_RELEASE_TABLET | Freq: Two times a day (BID) | ORAL | Status: DC

## 2015-07-04 MED ORDER — LEVALBUTEROL HCL 0.63 MG/3ML IN NEBU
0.6300 mg | INHALATION_SOLUTION | Freq: Three times a day (TID) | RESPIRATORY_TRACT | Status: DC
  Administered 2015-07-04 (×3): 0.63 mg via RESPIRATORY_TRACT
  Filled 2015-07-04 (×3): qty 3

## 2015-07-04 MED ORDER — SEVELAMER CARBONATE 800 MG PO TABS: 800.0000 mg | ORAL_TABLET | Freq: Three times a day (TID) | ORAL | Status: DC

## 2015-07-04 MED ORDER — MUPIROCIN 2 % EX OINT
1.0000 "application " | TOPICAL_OINTMENT | Freq: Two times a day (BID) | CUTANEOUS | Status: DC
  Administered 2015-07-04: 1 via NASAL
  Filled 2015-07-04: qty 22

## 2015-07-04 NOTE — Clinical Social Work Placement (Addendum)
   CLINICAL SOCIAL WORK PLACEMENT  NOTE 07/04/15 - DISCHARGED TO [facility]  Date:  07/04/2015  Patient Details  Name: Terry [name] MRN: [id number] Date of Birth: [birth year]  Clinical Social Work is seeking post-discharge placement for this patient at the [place] level of care (*CSW will initial, date and re-position this form in  chart as items are completed):  Yes   Patient/family provided with [facility] Work Department's list of facilities offering this level of care within the geographic area requested by the patient (or if unable, by the patient's family).  Yes   Patient/family informed of their freedom to choose among providers that offer the needed level of care, that participate in Medicare, Medicaid or managed care program needed by the patient, have an available bed and are willing to accept the patient.  Yes   Patient/family informed of [facility]'s ownership interest in [facility] and [facility], as well as of the fact that they are under no obligation to receive care at these facilities.  PASRR submitted to EDS on 06/11/15     PASRR number received on 06/11/15     Existing PASRR number confirmed on       FL2 transmitted to all facilities in geographic area requested by pt/family on 06/11/15     FL2 transmitted to all facilities within larger geographic area on       Patient informed that his/her managed care company has contracts with or will negotiate with certain facilities, including the following:         YES - Patient/family informed of bed offers received.  Patient chooses bed at  [facility]     Physician recommends and patient chooses bed at      Patient to be transferred to  [facility] on  07/04/15.  Patient to be transferred to facility by  ambulance     Patient family notified on  07/04/15 of transfer.  Name of family member notified:   Wife Thayer Headings [phone])      PHYSICIAN Please sign FL2     Additional Comment:  07/04/15 - Received insurance authorization from [employer] [place] with [employer] [id number] for 7 days.    _______________________________________________ Sable Feil, LCSW 07/04/2015, 4:46 PM

## 2015-07-04 NOTE — Progress Notes (Signed)
Report given to nurse at [facility].

## 2015-07-04 NOTE — Progress Notes (Signed)
[facility] Progress Note  Assessment/Plan: 1. Sepsis/ HCAP- per primary with previous intubation- s/p abx  2. ESRD -MWF - no heparin HD due to GIB - tolerated HD Wednesday though asked to come off; next HD Friday - plan first round 3. Anemia - Hgb 8.6 stable s/p 3 units pRBC on max ESA 4. Secondary hyperparathyroidism - hectorol 2- corr Ca ok P ok renvela 1 ac 5. HTN/volume - UF volume on HD Monday not recorded; net UF wed 1.5 - unable to stand for weights 6. Nutrition - alb 2.1 renal diet/nepro/prostat/vit; intake slowly improving - see by RD 7. afib - no antiocoag 8. Diarrhea - cdiff neg, flexiseal removed 9. Debility - not a candidate for LTAC - able to do HD in recliner - so ok for d/c to SNF; transferred with assist yesterday  Myriam Jacobson, PA-C [facility] [phone] 07/04/2015,9:07 AM  LOS: 27 days   Pt seen, examined and agree w A/P as above. May be going out to SNF today.  Able to sit in chair for HD. OK for dc from renal standpoint. New dry wt at dc.  Kelly Splinter MD [facility] pager [phone]    cell [phone] 07/04/2015, 12:26 PM     Subjective:   No new issues  Objective Filed Vitals:   07/03/15 1906 07/03/15 2014 07/04/15 0522 07/04/15 0743  BP: 132/59 141/58 148/60   Pulse: 100 112 92   Temp:  98.1 F (36.7 C) 98.8 F (37.1 C)   TempSrc: Oral     Resp: 18 20 21    Height:      Weight:  78.019 kg (172 lb)    SpO2: 96% 97% 94% 96%   Physical Exam General: NAD Heart: RRR Lungs: no rales Abdomen: soft NT Extremities:no LE edema Dialysis Access: right IJ and right upper AVF maturing  Dialysis Orders: MWF NW 4h 2/2.25 bath 86kg Hep 8600 R IJ cath / RUE AVF(maturing) Venofer 100/ hd x 10  Hect 1 ug  Additional Objective Labs: Basic Metabolic Panel:  Recent Labs Lab 07/01/15 1415 07/02/15 0728 07/03/15 0523  NA 135 136 132*  K 3.9 3.4* 3.1*  CL 97* 101 95*  CO2 28 29 27    GLUCOSE 100* 84 89  BUN 48* 23* 30*  CREATININE 5.36* 3.22* 4.10*  CALCIUM 7.6* 6.9* 7.5*  PHOS 4.6 3.4 3.1   Liver Function Tests:  Recent Labs Lab 06/29/15 0549 06/30/15 0558 07/01/15 1415 07/02/15 0728 07/03/15 0523  AST 206* 91*  --   --   --   ALT 349* 218*  --   --   --   ALKPHOS 59 57  --   --   --   BILITOT 1.1 0.6  --   --   --   PROT 4.5* 4.2*  --   --   --   ALBUMIN 2.1* 2.0* 2.2* 2.1* 2.1*   CBC:  Recent Labs Lab 06/28/15 0912 06/29/15 0549 06/30/15 0558 07/01/15 1415 07/02/15 0728 07/03/15 0523  WBC 9.8 7.0 6.9 7.1  --  6.4  NEUTROABS  --   --  5.1  --   --  4.5  HGB 8.3* 8.2* 8.1* 8.4* 8.0* 8.6*  HCT 25.7* 25.2* 25.2* 26.8* 25.3* 27.0*  MCV 93.1 94.0 95.8 96.1  --  94.7  PLT 84* 84* 89* 104*  --  111*   Blood Culture    Component Value Date/Time   SDES BRONCHIAL ALVEOLAR LAVAGE 06/12/2015 1503  SPECREQUEST NONE 06/12/2015 1503   CULT  06/12/2015 1503    ABUNDANT ENTEROBACTER CLOACAE Note: TIGECYCLINE  R >=8 COLISTIN  .25ug/ml ETEST results for this drug are "FOR INVESTIGATIONAL USE ONLY" and should NOT be used for clinical purposes. CRE CRITICAL RESULT CALLED TO, READ BACK BY AND VERIFIED WITH: J BISHOP RN 11AM 06/16/15 GUSTK    REPTSTATUS 06/18/2015 FINAL 06/12/2015 1503   Medications:   . antiseptic oral rinse  7 mL Mouth Rinse BID  . arformoterol  15 mcg Nebulization BID  . atorvastatin  20 mg Oral q1800  . budesonide (PULMICORT) nebulizer solution  0.5 mg Nebulization BID  . chlorhexidine gluconate  15 mL Mouth Rinse BID  . clotrimazole  10 mg Oral 5 X Daily  . darbepoetin (ARANESP) injection - DIALYSIS  200 mcg Intravenous Q Mon-HD  . doxercalciferol  2 mcg Intravenous Q M,W,F-HD  . feeding supplement (NEPRO CARB STEADY)  237 mL Oral BID BM  . feeding supplement (PRO-STAT SUGAR FREE 64)  30 mL Oral BID  . lactobacillus acidophilus  2 tablet Oral TID  . levalbuterol  0.63 mg Nebulization TID  . metoprolol tartrate  25 mg Oral BID   . multivitamin  1 tablet Oral QHS  . pantoprazole  40 mg Oral BID AC  . sevelamer carbonate  800 mg Oral TID WC  . sodium chloride  3 mL Intravenous Q12H

## 2015-07-04 NOTE — Discharge Summary (Signed)
Physician Discharge Summary  Terry [name] [id number] DOB: Oct 06, 1934 DOA: 06/07/2015  PCP: Colin Benton R., DO  Admit date: 06/07/2015 Discharge date: 07/04/2015  Time spent: 50 minutes  Recommendations for Outpatient Follow-up:  1. Consider resuming Coumadin for A. fib in 3-4 weeks while watching for recurrence of GI bleed 2.   Continue Protonix 40 mg twice a day for at least 2 weeks and then wean down to once a day 3.    Due to excoriation in his sacral area, he will need an air mattress until he has healed  Discharge Condition: Stable   Discharge Diagnoses:  Principal Problem:   Pneumonia due to Klebsiella pneumoniae and  Enterobacter cloacae--  Acute respiratory failure with hypoxia    Active Problems:    Sepsis due to pneumonia ([place])   Acute blood loss anemia   Acute GI bleeding   COPD GOLD III   CAD (coronary artery disease) of artery bypass graft   Essential hypertension   Chronic atrial fibrillation (HCC)   Chest tube in place   Encounter for intubation   Encounter for orogastric (OG) tube placement   End-stage renal disease on hemodialysis ([place])   Pneumothorax, left   Chronic diastolic heart failure (HCC)   Anemia of chronic disease   Skin excoriation   History of ETT   Palliative care encounter   History of present illness:  Terry [name] is a 81 y.o. male with end-stage renal disease on hemodialysis who presented from dialysis with a complaint of cough. The patient required intubation shortly after being brought to the hospital and then subsequently suffered a left sided tension pneumothorax requiring a chest tube. ETT aspirate grew out multidrug resistant Klebsiella pneumonia and Enterobacter-he completed antibiotics on 1/6. He subsequently developed melena on 1/4 and warfarin was reversed. He was given 3 units of packed red blood cells as well. His hemoglobin has since stabilized. An EGD was not performed due to his pneumonia/respiratory failure and  sepsis.   Hospital Course:  Principal Problem:  HCAP (healthcare-associated pneumonia) - Acute respiratory failure with hypoxia --Sepsis  -Resolved-completed in week course of antibiotics   Active Problems:  COPD GOLD III -Weaning O2 as able -Continue Brovana, Pulmicort, Xopenex -Resumed Spiriva   Anemia of chronic disease/ Acute blood loss anemia/acute upper GI bleed -Status post 3 units of packed red blood cells -GI felt that he was too high risk for EGD -continue Protonix 40 mg twice a day for at least 2 weeks and then wean down to once a day -Hemoglobin remaining steady at 8-9 range   CAD (coronary artery disease) of artery bypass graft -Continue Lipitor    Essential hypertension -Continue metoprolol   Chronic atrial fibrillation (HCC) -Currently not a candidate for Coumadin due to upper GI bleed-would recommend staying off of anticoagulation for 3-4 weeks and then resuming anticoagulation while watching carefully for recurrence of bleeding  -Continue metoprolol for rate control   End-stage renal disease on hemodialysis -Continue to dialyze per nephrology   Pneumothorax, left -Resolved-chest tube removed   Chronic diastolic heart failure  -Fluid management with dialysis   Procedures: 2-D echo: ejection fraction [id number], diastolic dysfunction, Chest tube  Consultations:  [facility]    pulmonary critical care  Discharge Exam: Filed Weights   07/03/15 0420 07/03/15 2014  Weight: 85.276 kg (188 lb) 78.019 kg (172 lb)   Filed Vitals:   07/03/15 2014 07/04/15 0522  BP: 141/58 148/60  Pulse: 112 92  Temp: 98.1 F (36.7 C)  98.8 F (37.1 C)  Resp: 20 21    General: AAO x 3, no distress Cardiovascular: RRR, no murmurs  Respiratory: clear to auscultation bilaterally GI: soft, non-tender, non-distended, bowel sound positive  Discharge Instructions You were cared for by a hospitalist during your hospital stay. If you have any questions about your  discharge medications or the care you received while you were in the hospital after you are discharged, you can call the unit and asked to speak with the hospitalist on call if the hospitalist that took care of you is not available. Once you are discharged, your primary care physician will handle any further medical issues. Please note that NO REFILLS for any discharge medications will be authorized once you are discharged, as it is imperative that you return to your primary care physician (or establish a relationship with a primary care physician if you do not have one) for your aftercare needs so that they can reassess your need for medications and monitor your lab values.      Discharge Instructions    Discharge instructions    Complete by:  As directed   Low sodium heart healthy renal diet     Increase activity slowly    Complete by:  As directed             Medication List    STOP taking these medications        HYDROcodone-acetaminophen 5-325 MG tablet  Commonly known as:  NORCO/VICODIN     magic mouthwash Soln     midodrine 10 MG tablet  Commonly known as:  PROAMATINE     warfarin 4 MG tablet  Commonly known as:  COUMADIN      TAKE these medications        albuterol 108 (90 Base) MCG/ACT inhaler  Commonly known as:  VENTOLIN HFA  Inhale 2 puffs into the lungs every 4 (four) hours as needed for wheezing or shortness of breath.     atorvastatin 20 MG tablet  Commonly known as:  LIPITOR  Take 1 tablet (20 mg total) by mouth daily.     benzonatate 200 MG capsule  Commonly known as:  TESSALON  Take 1 capsule (200 mg total) by mouth 3 (three) times daily as needed for cough.     budesonide-formoterol 160-4.5 MCG/ACT inhaler  Commonly known as:  SYMBICORT  Inhale 2 puffs into the lungs 2 (two) times daily.     clonazePAM 1 MG tablet  Commonly known as:  KLONOPIN  Take 1 tablet (1 mg total) by mouth daily as needed for anxiety (for HD treatment).      guaiFENesin-dextromethorphan 100-10 MG/5ML syrup  Commonly known as:  ROBITUSSIN DM  Take 5 mLs by mouth every 4 (four) hours as needed for cough.     ipratropium-albuterol 0.5-2.5 (3) MG/3ML Soln  Commonly known as:  DUONEB  Take 3 mLs by nebulization 3 (three) times daily.     methocarbamol 500 MG tablet  Commonly known as:  ROBAXIN  Take 1 tablet (500 mg total) by mouth every 8 (eight) hours as needed for muscle spasms.     metoprolol tartrate 25 MG tablet  Commonly known as:  LOPRESSOR  Take 1 tablet (25 mg total) by mouth 2 (two) times daily.     pantoprazole 40 MG tablet  Commonly known as:  PROTONIX  Take 1 tablet (40 mg total) by mouth 2 (two) times daily before a meal.     polyethylene glycol packet  Commonly known as:  MIRALAX  Take 17 g by mouth daily as needed for moderate constipation.     senna-docusate 8.6-50 MG tablet  Commonly known as:  SENOKOT S  Take 2 tablets by mouth at bedtime as needed for mild constipation.     sevelamer carbonate 800 MG tablet  Commonly known as:  RENVELA  Take 1 tablet (800 mg total) by mouth 3 (three) times daily with meals.     tiotropium 18 MCG inhalation capsule  Commonly known as:  SPIRIVA HANDIHALER  INHALE THE CONTENTS OF ONE CAPSULE INTO LUNGS ONCE DAILY      ASK your doctor about these medications        amLODipine 10 MG tablet  Commonly known as:  NORVASC  Take 1 tablet (10 mg total) by mouth daily.       Allergies  Allergen Reactions  . Yellow Dyes (Non-Tartrazine) Other (See Comments)    Burns arms Cough medications (tussionex and benzonatate ok)  . Levaquin [Levofloxacin In D5w] Itching   Follow-up Information    Follow up with [facility] .   Specialty:  [place] information:   [address] [place] [phone]       The results of significant diagnostics from this hospitalization (including imaging, microbiology,  ancillary and laboratory) are listed below for reference.    Significant Diagnostic Studies: Ct Angio Chest Pe W/cm &/or Wo Cm  06/13/2015  CLINICAL DATA:  Short of breath. Hypoxia. History of hypertension and stroke. Dialysis patient. EXAM: CT ANGIOGRAPHY CHEST WITH CONTRAST TECHNIQUE: Multidetector CT imaging of the chest was performed using the standard protocol during bolus administration of intravenous contrast. Multiplanar CT image reconstructions and MIPs were obtained to evaluate the vascular anatomy. CONTRAST:  6mL OMNIPAQUE IOHEXOL 350 MG/ML SOLN COMPARISON:  Chest radiograph, 06/13/2015. FINDINGS: Angiographic study: There is no evidence of a pulmonary embolus. Great vessels normal in caliber. Atherosclerotic calcifications noted throughout the thoracic aorta. Neck base and axilla:  No mass or adenopathy. Mediastinum and hila: Mildly enlarged heart. Dense multivessel coronary artery calcifications. No mediastinal or hilar masses. There are calcifications along the right hilum consistent with healed granulomatous disease. No discrete enlarged lymph nodes. Endotracheal tube tip well-positioned 2 cm above the chronic. Orogastric tube passes well below the diaphragm into the mid stomach. Lungs and pleural: There multiple irregular areas of ground-glass and more dense airspace consolidation. These are seen in both lungs but predominate in the right upper lobe and posterior aspects of both lower lobes. Some of the opacity in the posterior lower lobes is likely atelectasis. The majority of the lung opacity most suggestive of multifocal pneumonia. No convincing pulmonary edema. There are underlying changes of emphysema. Left-sided chest tube extends along the lateral margin of the left oblique fissure. He minimal left pneumothorax is noted along the anterior mid to lower left hemi thorax. No pleural effusions. Limited upper abdomen: Liver shows relative enlargement of the lateral segment left lobe is well  as surface nodularity, findings suggesting cirrhosis. No liver mass or focal lesion. Visualized upper kidneys show numerous cysts and cortical thinning. No acute findings in the upper abdomen. Musculoskeletal: Bones are demineralized. There are degenerative changes noted along the thoracic spine. No osteoblastic or osteolytic lesions. Review of the MIP images confirms the above findings. IMPRESSION: 1. No evidence of a pulmonary embolism. 2. Heterogeneous areas of lung consolidation consistent with multifocal pneumonia. This is most evident on the right. No  evidence of pulmonary edema. 3. Minimal left pneumothorax. 4. Support apparatus is well positioned. Electronically Signed   By: Lajean Manes M.D.   On: 06/13/2015 19:06   Dg Chest Port 1 View  07/01/2015  CLINICAL DATA:  Shortness of breath.  Wheezing and lethargy. EXAM: PORTABLE CHEST 1 VIEW COMPARISON:  Most recent radiograph 06/19/2015, chest CT 06/13/2015 FINDINGS: Right-sided dialysis catheter remains in place, tip at the atrial caval junction. Cardiomegaly mediastinal contours are unchanged. Retrocardiac opacity is ill-defined, likely atelectasis, with slight worsening in the interim. Improving right lung base aeration. The hazy right mid lung zone opacity on prior exams partially obscured by overlying monitoring devices, less well-defined. No evident pneumothorax or pleural effusion. IMPRESSION: 1. Interval worsening of left lung base aeration with improved opacity in the right lower lung zone. 2. Previous hazy opacity in the right midlung zone is again seen, less well-defined and partially obscured by overlying monitoring devices. Electronically Signed   By: Jeb Levering M.D.   On: 07/01/2015 19:46   Dg Chest Port 1 View  06/19/2015  CLINICAL DATA:  Chest tube removal. EXAM: PORTABLE CHEST 1 VIEW 3:33 p.m. COMPARISON:  Chest x-ray dated 06/19/2015 at 4:43 a.m. FINDINGS: Left chest tube has been removed. No recurrence of pneumothorax. Slight  atelectasis at the left lung base, unchanged. Improved infiltrate at the right lung base. Slight increased infiltrate in the right midzone. Double lumen central venous catheter in place, unchanged. IMPRESSION: 1. No pneumothorax after left chest tube removal. Persistent slight left base atelectasis. 2. Improved infiltrate at the right base. 3. Slightly increased hazy infiltrate in the right midzone. Electronically Signed   By: Lorriane Shire M.D.   On: 06/19/2015 15:45   Dg Chest Port 1 View  06/19/2015  CLINICAL DATA:  Recent pneumothorax with COPD EXAM: PORTABLE CHEST 1 VIEW COMPARISON:  June 17, 2015 FINDINGS: Central catheter tip is in the superior vena cava near the cavoatrial junction. There is a chest tube on the left. No pneumothorax apparent. There is patchy airspace consolidation in both lung bases, more on the right than on the left. There is also patchy airspace opacity in the right mid lung, slightly decreased compared to 2 days prior. No new opacity identified. Heart is mildly prominent with pulmonary vascularity stable and within normal limits. No adenopathy apparent. IMPRESSION: No pneumothorax apparent. Persistent airspace opacity in the lung bases, more on the right than on the left. Patchy airspace opacity remains in the right mid lung, slightly less compared to 2 days prior. No new opacity. No change in cardiac silhouette. Electronically Signed   By: Lowella Grip III M.D.   On: 06/19/2015 07:13   Dg Chest Port 1 View  06/17/2015  CLINICAL DATA:  Respiratory failure EXAM: PORTABLE CHEST - 1 VIEW COMPARISON:  the previous day's study FINDINGS: Patient has been extubated and the nasogastric tube removed. Right IJ tunneled hemodialysis catheter stable. Left chest tube remains in place with no pneumothorax evident. Patchy alveolar opacities in the right mid and lower lung persist. Perihilar and bibasilar interstitial prominence stable. Heart size within normal limits for technique.  Atheromatous aorta. No effusion. Visualized skeletal structures are unremarkable. IMPRESSION: 1. Extubation with little change in asymmetric infiltrates or edema. 2. Remaining Support hardware stable in position. Electronically Signed   By: Lucrezia Europe M.D.   On: 06/17/2015 09:48   Dg Chest Port 1 View  06/16/2015  CLINICAL DATA:  Pneumonia.  Intubated. EXAM: PORTABLE CHEST 1 VIEW COMPARISON:  06/15/2015 FINDINGS:  The endotracheal tube tip is above the carina. There is a a right IJ catheter with tip in the cavoatrial junction. The nasogastric tube tip is in the stomach. Left chest tube is in place. No significant pneumothorax. Airspace opacities within the right midlung and right base are unchanged. IMPRESSION: 1. No change in right midlung and right base airspace opacities compatible with pneumonia. Electronically Signed   By: Kerby Moors M.D.   On: 06/16/2015 07:52   Dg Chest Port 1 View  06/15/2015  CLINICAL DATA:  Pneumonia, intubated, ventilatory support EXAM: PORTABLE CHEST 1 VIEW COMPARISON:  06/13/2015 FINDINGS: Endotracheal tube remains approximately 15 mm above the carina. NG tube enters the stomach with the tip not visualized. Right IJ dialysis catheter tips SVC RA junction. Left chest tube also stable in position. Cardiomegaly evident with central vascular congestion. Patchy airspace process in the right mid and lower lung. There is improved aeration of the left lower lobe. No enlarging effusion or pneumothorax. Atherosclerosis of aorta. IMPRESSION: Persistent right lung airspace process compatible with pneumonia. Improving left lower lobe aeration Stable support apparatus Electronically Signed   By: Jerilynn Mages.  Shick M.D.   On: 06/15/2015 09:21   Dg Chest Port 1 View  06/13/2015  CLINICAL DATA:  81 year old male with acute respiratory failure, pneumonia. COPD, end-stage renal disease. Initial encounter. EXAM: PORTABLE CHEST 1 VIEW COMPARISON:  06/12/2015 and earlier. FINDINGS: Portable AP semi  upright view at 0505 hours. Intubated. Endotracheal tube tip now projects about 15 mm above the carina. Enteric tube courses to the abdomen, tip not included. Stable right IJ approach dual lumen dialysis type catheter. Stable left chest tube. Further improved left lung base ventilation. Residual reticulonodular opacity at the left lung, and in the right mid and lower lung. No pneumothorax, pulmonary edema, or pleural effusion identified. IMPRESSION: 1. Endotracheal tube tip about 15 mm above the carina. Otherwise stable lines and tubes. 2. Bilateral multifocal pneumonia suspected. Interval improved left lung base ventilation. No pneumothorax, edema, or effusion identified. Electronically Signed   By: Genevie Ann M.D.   On: 06/13/2015 07:25   Dg Chest Port 1 View  06/12/2015  CLINICAL DATA:  Status post endotracheal tube placement EXAM: PORTABLE CHEST - 1 VIEW COMPARISON:  06/12/2015 FINDINGS: Endotracheal tube is now been placed in lies approximately 4 cm above the carina. Nasogastric catheter is noted extending into the stomach. A left-sided chest tube and dialysis catheter are again noted and stable. Patchy density is again noted throughout the right lung and left base. IMPRESSION: Status post endotracheal tube and nasogastric catheter placement in satisfactory position. The remainder of the exam is stable from the previous study. Electronically Signed   By: Inez Catalina M.D.   On: 06/12/2015 12:21   Dg Chest Port 1 View  06/12/2015  CLINICAL DATA:  Pulmonary edema. EXAM: PORTABLE CHEST 1 VIEW COMPARISON:  06/11/2015. FINDINGS: Right IJ sheath and left chest tube in stable position. Cardiomegaly with bilateral pulmonary interstitial prominence, no change from prior exam. Small left pleural effusion. Tiny left apical pneumothorax again noted . IMPRESSION: 1.  Right IJ sheath and left chest tube in stable position. 2. Cardiomegaly with bilateral pulmonary interstitial prominence consistent congestive heart  failure. No interim change from prior exam. Small left pleural effusion . 3.  Tiny left apical pneumothorax again noted. Critical Value/emergent results were called by telephone at the time of interpretation on 06/12/2015 at 7:21 am to nurse Shea Stakes, who verbally acknowledged these results. Electronically Signed   By: Marcello Moores  Register   On: 06/12/2015 07:19   Dg Chest Port 1 View  06/11/2015  CLINICAL DATA:  Chest tube, shortness of breath, acute respiratory failure, history of COPD, Coronary artery disease, sepsis, and pneumonia. EXAM: PORTABLE CHEST 1 VIEW COMPARISON:  Portable chest x-ray of June 10, 2015 FINDINGS: The lungs are adequately inflated. The left apical pneumothorax is not visible today. There is persistent increased interstitial density within both lungs that is slightly less conspicuous today. The left-sided chest tube is unchanged in position overlying the posterior medial aspect of the sixth rib. There is a small left pleural effusion. The retrocardiac region on the left remains dense. The cardiac silhouette is enlarged but stable. The pulmonary vascularity is less prominent today. The dual-lumen dialysis type catheter tip projects over the junction of the middle and distal thirds of the SVC. IMPRESSION: 1. Slight interval improvement in bilateral interstitial infiltrates. Persistent left lower lobe atelectasis or pneumonia. Persistent trace left pleural effusion. The left apical pneumothorax is no longer evident. 2. Stable enlargement of the cardiac silhouette with decreased prominence of the pulmonary vascularity. 3. Stable left-sided chest tube and right internal jugular dialysis catheter. Electronically Signed   By: David  Martinique M.D.   On: 06/11/2015 07:30   Dg Chest Port 1 View  06/10/2015  CLINICAL DATA:  Left pneumothorax and chest tube. Shortness of breath. EXAM: PORTABLE CHEST 1 VIEW COMPARISON:  06/09/2015 FINDINGS: Left chest tube remains in place. Small left mid thorax  noted. Left lower lobe atelectasis or infiltrate. Patchy opacities in the right mid and lower lung have increased since prior study. Heart is upper limits normal in size. Interval extubation.  Right dialysis catheter is unchanged. IMPRESSION: Stable left chest tube position.  Small left apical pneumothorax. Stable left lower lobe atelectasis or infiltrate. Patchy opacities in the right mid and lower lung have increased. Electronically Signed   By: Rolm Baptise M.D.   On: 06/10/2015 11:05   Dg Chest Portable 1 View  06/09/2015  CLINICAL DATA:  81 year old male with respiratory failure EXAM: PORTABLE CHEST 1 VIEW COMPARISON:  06/06/2009 and prior radiographs FINDINGS: An endotracheal tube with tip 3.6 cm above the carina, NG tube within the stomach, right IJ central venous catheter with tip overlying the superior cavoatrial junction, and left thoracostomy tube identified. Bibasilar atelectasis again noted. There is no evidence of pneumothorax. There has been little interval change since the prior study. IMPRESSION: Unchanged appearance of the chest with bibasilar atelectasis. No evidence of pneumothorax. Electronically Signed   By: Margarette Canada M.D.   On: 06/09/2015 08:54   Dg Chest Port 1 View  06/07/2015  CLINICAL DATA:  Chest tube readjustment EXAM: PORTABLE CHEST 1 VIEW COMPARISON:  Portable exam 2224 hours compared to 1217 hours FINDINGS: Tip of endotracheal tube projects 1.5 cm above carina. Nasogastric tube extends into stomach with proximal side-port likely above gastroesophageal junction. RIGHT jugular central venous catheter with tip projecting over RIGHT atrium. LEFT thoracostomy tube present. Enlargement of cardiac silhouette. Mediastinal contours and pulmonary vascularity normal. Atherosclerotic calcification aorta. Bibasilar atelectasis. No infiltrate, pleural effusion, or pneumothorax. IMPRESSION: Bibasilar atelectasis. Line and tube positions as above ; recommend advancing nasogastric tube 5 cm  to position proximal side-port within stomach. No pneumothorax. Electronically Signed   By: Lavonia Dana M.D.   On: 06/07/2015 23:01   Dg Chest Port 1 View  06/07/2015  CLINICAL DATA:  Chest tube placement EXAM: PORTABLE CHEST 1 VIEW COMPARISON:  06/07/2015 FINDINGS: Endotracheal tube with tip measuring 4.4  cm above the carina. Enteric tube tip is off the field of view but below the left hemidiaphragm. Left central venous catheter with tip over the cavoatrial junction. Interval left chest tube placement. Previous left chest tube remains. Minimal residual left pneumothorax in the left costophrenic angle region. Atelectasis in both lung bases. Can't exclude a small amount of pneumomediastinum on the left although this could be due to residual pneumothorax. IMPRESSION: Appliances appear in satisfactory position. Small residual left pneumothorax. Gas along the left mediastinal border could represent pneumo mediastinum or residual pneumothorax. Atelectasis in the lung bases. Electronically Signed   By: Lucienne Capers M.D.   On: 06/07/2015 23:00   Dg Chest Portable 1 View  06/07/2015  CLINICAL DATA:  Post chest decompression. EXAM: PORTABLE CHEST 1 VIEW COMPARISON:  06/07/2015 FINDINGS: Interval placement of a left chest tube. There is evacuation of most of the previous tension left pneumothorax. There is a small residual pneumothorax in the left base. The lung has mostly re- expanded with mild atelectasis in the base. Linear atelectasis demonstrated in the right lung base. Endotracheal tube with tip measuring 4.2 cm above the carina. Right central venous catheter with tip over the cavoatrial junction. IMPRESSION: Interval evacuation of left pneumothorax with re-expansion of the left lung. Minimal residual pneumothorax in the left base and mild atelectasis in both lung bases. Electronically Signed   By: Lucienne Capers M.D.   On: 06/07/2015 21:44   Dg Chest Portable 1 View  06/07/2015  CLINICAL DATA:   Initial evaluation for endotracheal tube placement. EXAM: PORTABLE CHEST 1 VIEW COMPARISON:  Prior radiograph performed earlier on the same day. FINDINGS: There has been interval placement of an endotracheal tube with the tip approximately 2.3 cm above the carina. There is suggestion of possible early right root mediastinal shift. Cardiac silhouette is stable. Right IJ approach dual lumen hemodialysis catheter is stable. New large left pneumothorax with essentially complete collapse of the left lung medially. Suggestion of possible early right word mediastinal shift, suggesting tension component. No right-sided pneumothorax. Basilar right airspace opacities again seen, stable. IMPRESSION: 1. New large left pneumothorax with near complete collapse of the left lung. There is suggestion of early rightward mediastinal shift, concerning for tension component. 2. Tip of endotracheal tube approximately 2.3 cm above the carina. 3. Stable right basilar airspace opacity, which may reflect atelectasis or infiltrate. Critical Value/emergent results were called by telephone at the time of interpretation on 06/07/2015 at 9:25 pm to Dr. Carmin Muskrat, who verbally acknowledged these results. Electronically Signed   By: Jeannine Boga M.D.   On: 06/07/2015 21:30   Dg Chest Port 1 View  06/07/2015  CLINICAL DATA:  Shortness of breath. EXAM: PORTABLE CHEST 1 VIEW COMPARISON:  06/01/2015 FINDINGS: There is a right IJ catheter with tip seen in the cavoatrial junction. Heart size is normal. Atelectasis is present in both lung bases. There are airspace opacities identified within both lower lobes. When compared with the previous exam the aeration to the right lower lobe is unchanged. There is worsening aeration the left lower lobe. IMPRESSION: 1. Bilateral lower lobe airspace opacities. 2. Bibasilar atelectasis. Electronically Signed   By: Kerby Moors M.D.   On: 06/07/2015 16:01   Dg Abd Portable 1v  06/12/2015   CLINICAL DATA:  Orogastric tube placement EXAM: PORTABLE ABDOMEN - 1 VIEW COMPARISON:  None. FINDINGS: Orogastric tube tip and side port are in the stomach. Bowel gas pattern is unremarkable. There is consolidation in the medial left lung  base. IMPRESSION: Orogastric tube tip and side port in stomach. Bowel gas pattern unremarkable. Consolidation medial left lung base. Electronically Signed   By: Lowella Grip III M.D.   On: 06/12/2015 12:22   Dg Abd Portable 1v  06/08/2015  CLINICAL DATA:  OG tube placement. EXAM: PORTABLE ABDOMEN - 1 VIEW COMPARISON:  None. FINDINGS: Enteric tube tip is in the left upper quadrant consistent with location in the body of the stomach. Visualized bowel gas pattern is normal. No small or large bowel distention. There appears to be atelectasis in the lung bases. Degenerative changes in the spine and hips. Vascular calcifications. IMPRESSION: Enteric tube tip in the left upper quadrant consistent with location in the body of the stomach. Atelectasis in the lung bases. Electronically Signed   By: Lucienne Capers M.D.   On: 06/08/2015 00:58   Dg Swallowing Func-speech Pathology  06/17/2015  Objective Swallowing Evaluation:   MBS Patient Details Name: Terry [name] MRN: [id number] Date of Birth: 12-Aug-1934 Today's Date: 06/17/2015 Time: SLP Start Time (ACUTE ONLY): 1118-SLP Stop Time (ACUTE ONLY): 1137 SLP Time Calculation (min) (ACUTE ONLY): 19 min Past Medical History: Past Medical History Diagnosis Date . Stroke ([place]) 2012 . High cholesterol  . Hypertension  . Kidney disease    CKD4, sees [place] [facility], considering dialysis . COPD (chronic obstructive pulmonary disease) ([place])  . CAD (coronary artery disease) [place], [name] . Acute and chronic respiratory failure 10/22/2012 . Osteoarthritis 10/22/2012 . Pneumonia April 2014 . Atrial fibrillation [facility])  Past Surgical History: Past Surgical History Procedure Laterality Date . Coronary stent placement  1999   2 stents .  Appendectomy  1984 . Colon surgery  2012   partial colon removed - twisted bowel . Hernia repair     Right inguinal . Cystoscopy/retrograde/ureteroscopy Bilateral 04/23/2014   Procedure: CYSTOSCOPY BILATERAL RETROGRADE,  LEFT URETEROSCOPY, LEFT STENT PLACEMENT;  Surgeon: Raynelle Bring, MD;  Location: [facility];  Service: Urology;  Laterality: Bilateral; . Av fistula placement Right 05/21/2015   Procedure: Right Arm Brachiocephalic ARTERIOVENOUS (AV) FISTULA CREATION;  Surgeon: Angelia Mould, MD;  Location: [facility] OR;  Service: Vascular;  Laterality: Right; HPI: 81yo male with hx ESRD, AFib, COPD, CVA presented 12/16 to the ED with a 2 day history of progressive SOB and cough with yellow sputum. Had worsening respiratory status while at HD and EMS was called. Diagnosed with PNA vs COPD exacerbation vs CHF exacerbation. Intubated 12/16-12/18, then again 12/21-12/25. No Data Recorded Assessment / Plan / Recommendation CHL IP CLINICAL IMPRESSIONS 06/17/2015 Therapy Diagnosis Mild pharyngeal phase dysphagia;Moderate pharyngeal phase dysphagia Clinical Impression Patient presents with a mild-moderate pharyngeal phase dysphagia, likely acute and reversible in nature due to multiple intubations and respiratory impairments impacting ability to coordinate swallow and apneic period. Decreased laryngeal closure and mildly delayed swallow initiation result in trace but silent penetration of thin liquids, prevented with use of chin tuck and small cup sips. Mild-moderate vallecular residuals present across consistencies (solids > liquids) due to base of tongue weakness are also reduced with use of chin tuck. Given current level of deconditioning and respiratory function, recommend initiation of conservative diet with pending upgrade to thin liquids at bedside once chin tuck can be completed consistently and independently. SLP will f/u.  Impact on safety and function Moderate aspiration risk   CHL IP TREATMENT RECOMMENDATION  06/17/2015 Treatment Recommendations Therapy as outlined in treatment plan below   Prognosis 06/17/2015 Prognosis for Safe Diet Advancement Good Barriers to Reach Goals --  Barriers/Prognosis Comment -- CHL IP DIET RECOMMENDATION 06/17/2015 SLP Diet Recommendations Dysphagia 1 (Puree) solids;Nectar thick liquid Liquid Administration via Cup;No straw Medication Administration Crushed with puree Compensations Slow rate;Small sips/bites;Chin tuck Postural Changes Seated upright at 90 degrees   CHL IP OTHER RECOMMENDATIONS 06/17/2015 Recommended Consults -- Oral Care Recommendations Oral care BID Other Recommendations Remove water pitcher;Prohibited food (jello, ice cream, thin soups);Order thickener from pharmacy   CHL IP FOLLOW UP RECOMMENDATIONS 06/17/2015 Follow up Recommendations Inpatient Rehab   CHL IP FREQUENCY AND DURATION 06/17/2015 Speech Therapy Frequency (ACUTE ONLY) min 2x/week Treatment Duration 2 weeks      CHL IP ORAL PHASE 06/17/2015 Oral Phase WFL Oral - Pudding Teaspoon -- Oral - Pudding Cup -- Oral - Honey Teaspoon -- Oral - Honey Cup -- Oral - Nectar Teaspoon -- Oral - Nectar Cup -- Oral - Nectar Straw -- Oral - Thin Teaspoon -- Oral - Thin Cup -- Oral - Thin Straw -- Oral - Puree -- Oral - Mech Soft -- Oral - Regular -- Oral - Multi-Consistency -- Oral - Pill -- Oral Phase - Comment --  CHL IP PHARYNGEAL PHASE 06/17/2015 Pharyngeal Phase Impaired Pharyngeal- Pudding Teaspoon -- Pharyngeal -- Pharyngeal- Pudding Cup -- Pharyngeal -- Pharyngeal- Honey Teaspoon -- Pharyngeal -- Pharyngeal- Honey Cup -- Pharyngeal -- Pharyngeal- Nectar Teaspoon -- Pharyngeal -- Pharyngeal- Nectar Cup Delayed swallow initiation-vallecula;Pharyngeal residue - valleculae;Reduced tongue base retraction Pharyngeal -- Pharyngeal- Nectar Straw -- Pharyngeal -- Pharyngeal- Thin Teaspoon -- Pharyngeal -- Pharyngeal- Thin Cup Delayed swallow initiation-vallecula;Reduced airway/laryngeal closure;Pharyngeal residue -  valleculae;Reduced tongue base retraction;Penetration/Aspiration during swallow;Compensatory strategies attempted (with notebox) Pharyngeal Material enters airway, remains ABOVE vocal cords and not ejected out Pharyngeal- Thin Straw -- Pharyngeal -- Pharyngeal- Puree Delayed swallow initiation-vallecula;Reduced tongue base retraction;Pharyngeal residue - valleculae Pharyngeal -- Pharyngeal- Mechanical Soft Delayed swallow initiation-vallecula;Reduced tongue base retraction;Pharyngeal residue - valleculae Pharyngeal -- Pharyngeal- Regular -- Pharyngeal -- Pharyngeal- Multi-consistency -- Pharyngeal -- Pharyngeal- Pill -- Pharyngeal -- Pharyngeal Comment --  CHL IP CERVICAL ESOPHAGEAL PHASE 06/17/2015 Cervical Esophageal Phase WFL Pudding Teaspoon -- Pudding Cup -- Honey Teaspoon -- Honey Cup -- Nectar Teaspoon -- Nectar Cup -- Nectar Straw -- Thin Teaspoon -- Thin Cup -- Thin Straw -- Puree -- Mechanical Soft -- Regular -- Multi-consistency -- Pill -- Cervical Esophageal Comment -- Gabriel Rainwater MA, CCC-SLP [phone] McCoy Leah Meryl 06/17/2015, 12:10 PM               Microbiology: Recent Results (from the past 240 hour(s))  C difficile quick scan w PCR reflex     Status: None   Collection Time: 06/29/15  5:52 AM  Result Value Ref Range Status   C Diff antigen NEGATIVE NEGATIVE Final   C Diff toxin NEGATIVE NEGATIVE Final   C Diff interpretation Negative for toxigenic C. difficile  Final     Labs: Basic Metabolic Panel:  Recent Labs Lab 06/29/15 0549 06/30/15 0558 06/30/15 1114 07/01/15 1415 07/02/15 0728 07/03/15 0523  NA 143 135  --  135 136 132*  K 3.4* 3.6  --  3.9 3.4* 3.1*  CL 104 102  --  97* 101 95*  CO2 28 25  --  28 29 27   GLUCOSE 91 112*  --  100* 84 89  BUN 18 31*  --  48* 23* 30*  CREATININE 2.52* 3.65*  --  5.36* 3.22* 4.10*  CALCIUM 7.6* 7.3*  --  7.6* 6.9* 7.5*  PHOS  --   --  3.1 4.6 3.4 3.1  Liver Function Tests:  Recent Labs Lab 06/29/15 0549 06/30/15 0558  07/01/15 1415 07/02/15 0728 07/03/15 0523  AST 206* 91*  --   --   --   ALT 349* 218*  --   --   --   ALKPHOS 59 57  --   --   --   BILITOT 1.1 0.6  --   --   --   PROT 4.5* 4.2*  --   --   --   ALBUMIN 2.1* 2.0* 2.2* 2.1* 2.1*   No results for input(s): LIPASE, AMYLASE in the last 168 hours. No results for input(s): AMMONIA in the last 168 hours. CBC:  Recent Labs Lab 06/28/15 0912 06/29/15 0549 06/30/15 0558 07/01/15 1415 07/02/15 0728 07/03/15 0523  WBC 9.8 7.0 6.9 7.1  --  6.4  NEUTROABS  --   --  5.1  --   --  4.5  HGB 8.3* 8.2* 8.1* 8.4* 8.0* 8.6*  HCT 25.7* 25.2* 25.2* 26.8* 25.3* 27.0*  MCV 93.1 94.0 95.8 96.1  --  94.7  PLT 84* 84* 89* 104*  --  111*   Cardiac Enzymes: No results for input(s): CKTOTAL, CKMB, CKMBINDEX, TROPONINI in the last 168 hours. BNP: BNP (last 3 results)  Recent Labs  05/13/15 2017 06/08/15 0016  BNP 362.1* 892.3*    ProBNP (last 3 results) No results for input(s): PROBNP in the last 8760 hours.  CBG: No results for input(s): GLUCAP in the last 168 hours.     SignedDebbe Odea, MD [facility] 07/04/2015, 9:19 AM

## 2015-07-05 ENCOUNTER — Telehealth: Payer: Self-pay | Admitting: *Deleted

## 2015-07-05 DIAGNOSIS — J156 Pneumonia due to other aerobic Gram-negative bacteria: Secondary | ICD-10-CM | POA: Diagnosis not present

## 2015-07-05 DIAGNOSIS — N186 End stage renal disease: Secondary | ICD-10-CM | POA: Diagnosis not present

## 2015-07-05 DIAGNOSIS — J150 Pneumonia due to Klebsiella pneumoniae: Secondary | ICD-10-CM | POA: Diagnosis not present

## 2015-07-05 NOTE — Telephone Encounter (Signed)
FYI - patient was discharged to a [place]

## 2015-07-08 DIAGNOSIS — J150 Pneumonia due to Klebsiella pneumoniae: Secondary | ICD-10-CM | POA: Diagnosis not present

## 2015-07-08 DIAGNOSIS — N186 End stage renal disease: Secondary | ICD-10-CM | POA: Diagnosis not present

## 2015-07-08 DIAGNOSIS — J156 Pneumonia due to other aerobic Gram-negative bacteria: Secondary | ICD-10-CM | POA: Diagnosis not present

## 2015-07-09 ENCOUNTER — Other Ambulatory Visit: Payer: Medicare PPO

## 2015-07-09 DIAGNOSIS — I10 Essential (primary) hypertension: Secondary | ICD-10-CM | POA: Diagnosis not present

## 2015-07-09 DIAGNOSIS — I4891 Unspecified atrial fibrillation: Secondary | ICD-10-CM | POA: Diagnosis not present

## 2015-07-09 DIAGNOSIS — J189 Pneumonia, unspecified organism: Secondary | ICD-10-CM | POA: Diagnosis not present

## 2015-07-09 DIAGNOSIS — J449 Chronic obstructive pulmonary disease, unspecified: Secondary | ICD-10-CM | POA: Diagnosis not present

## 2015-07-10 DIAGNOSIS — I10 Essential (primary) hypertension: Secondary | ICD-10-CM | POA: Diagnosis not present

## 2015-07-10 DIAGNOSIS — J150 Pneumonia due to Klebsiella pneumoniae: Secondary | ICD-10-CM | POA: Diagnosis not present

## 2015-07-10 DIAGNOSIS — J156 Pneumonia due to other aerobic Gram-negative bacteria: Secondary | ICD-10-CM | POA: Diagnosis not present

## 2015-07-10 DIAGNOSIS — N186 End stage renal disease: Secondary | ICD-10-CM | POA: Diagnosis not present

## 2015-07-10 DIAGNOSIS — I4891 Unspecified atrial fibrillation: Secondary | ICD-10-CM | POA: Diagnosis not present

## 2015-07-10 DIAGNOSIS — J189 Pneumonia, unspecified organism: Secondary | ICD-10-CM | POA: Diagnosis not present

## 2015-07-10 DIAGNOSIS — J449 Chronic obstructive pulmonary disease, unspecified: Secondary | ICD-10-CM | POA: Diagnosis not present

## 2015-07-12 DIAGNOSIS — J156 Pneumonia due to other aerobic Gram-negative bacteria: Secondary | ICD-10-CM | POA: Diagnosis not present

## 2015-07-12 DIAGNOSIS — N186 End stage renal disease: Secondary | ICD-10-CM | POA: Diagnosis not present

## 2015-07-12 DIAGNOSIS — J449 Chronic obstructive pulmonary disease, unspecified: Secondary | ICD-10-CM | POA: Diagnosis not present

## 2015-07-12 DIAGNOSIS — I5032 Chronic diastolic (congestive) heart failure: Secondary | ICD-10-CM | POA: Diagnosis not present

## 2015-07-12 DIAGNOSIS — I10 Essential (primary) hypertension: Secondary | ICD-10-CM | POA: Diagnosis not present

## 2015-07-12 DIAGNOSIS — J150 Pneumonia due to Klebsiella pneumoniae: Secondary | ICD-10-CM | POA: Diagnosis not present

## 2015-07-13 DIAGNOSIS — J449 Chronic obstructive pulmonary disease, unspecified: Secondary | ICD-10-CM | POA: Diagnosis not present

## 2015-07-13 DIAGNOSIS — D638 Anemia in other chronic diseases classified elsewhere: Secondary | ICD-10-CM | POA: Diagnosis not present

## 2015-07-13 DIAGNOSIS — I10 Essential (primary) hypertension: Secondary | ICD-10-CM | POA: Diagnosis not present

## 2015-07-13 DIAGNOSIS — I4891 Unspecified atrial fibrillation: Secondary | ICD-10-CM | POA: Diagnosis not present

## 2015-07-15 DIAGNOSIS — J150 Pneumonia due to Klebsiella pneumoniae: Secondary | ICD-10-CM | POA: Diagnosis not present

## 2015-07-15 DIAGNOSIS — N186 End stage renal disease: Secondary | ICD-10-CM | POA: Diagnosis not present

## 2015-07-15 DIAGNOSIS — J156 Pneumonia due to other aerobic Gram-negative bacteria: Secondary | ICD-10-CM | POA: Diagnosis not present

## 2015-07-17 DIAGNOSIS — I4891 Unspecified atrial fibrillation: Secondary | ICD-10-CM | POA: Diagnosis not present

## 2015-07-17 DIAGNOSIS — J449 Chronic obstructive pulmonary disease, unspecified: Secondary | ICD-10-CM | POA: Diagnosis not present

## 2015-07-17 DIAGNOSIS — I10 Essential (primary) hypertension: Secondary | ICD-10-CM | POA: Diagnosis not present

## 2015-07-17 DIAGNOSIS — J156 Pneumonia due to other aerobic Gram-negative bacteria: Secondary | ICD-10-CM | POA: Diagnosis not present

## 2015-07-17 DIAGNOSIS — J150 Pneumonia due to Klebsiella pneumoniae: Secondary | ICD-10-CM | POA: Diagnosis not present

## 2015-07-17 DIAGNOSIS — N186 End stage renal disease: Secondary | ICD-10-CM | POA: Diagnosis not present

## 2015-07-17 DIAGNOSIS — D638 Anemia in other chronic diseases classified elsewhere: Secondary | ICD-10-CM | POA: Diagnosis not present

## 2015-07-19 DIAGNOSIS — I482 Chronic atrial fibrillation: Secondary | ICD-10-CM | POA: Diagnosis not present

## 2015-07-19 DIAGNOSIS — I5032 Chronic diastolic (congestive) heart failure: Secondary | ICD-10-CM | POA: Diagnosis not present

## 2015-07-19 DIAGNOSIS — A419 Sepsis, unspecified organism: Secondary | ICD-10-CM | POA: Diagnosis not present

## 2015-07-19 DIAGNOSIS — J156 Pneumonia due to other aerobic Gram-negative bacteria: Secondary | ICD-10-CM | POA: Diagnosis not present

## 2015-07-19 DIAGNOSIS — N186 End stage renal disease: Secondary | ICD-10-CM | POA: Diagnosis not present

## 2015-07-19 DIAGNOSIS — K922 Gastrointestinal hemorrhage, unspecified: Secondary | ICD-10-CM | POA: Diagnosis not present

## 2015-07-19 DIAGNOSIS — J150 Pneumonia due to Klebsiella pneumoniae: Secondary | ICD-10-CM | POA: Diagnosis not present

## 2015-07-19 DIAGNOSIS — J189 Pneumonia, unspecified organism: Secondary | ICD-10-CM | POA: Diagnosis not present

## 2015-07-19 DIAGNOSIS — J449 Chronic obstructive pulmonary disease, unspecified: Secondary | ICD-10-CM | POA: Diagnosis not present

## 2015-07-22 DIAGNOSIS — J156 Pneumonia due to other aerobic Gram-negative bacteria: Secondary | ICD-10-CM | POA: Diagnosis not present

## 2015-07-22 DIAGNOSIS — N186 End stage renal disease: Secondary | ICD-10-CM | POA: Diagnosis not present

## 2015-07-22 DIAGNOSIS — J150 Pneumonia due to Klebsiella pneumoniae: Secondary | ICD-10-CM | POA: Diagnosis not present

## 2015-07-23 DIAGNOSIS — N186 End stage renal disease: Secondary | ICD-10-CM | POA: Diagnosis not present

## 2015-07-23 DIAGNOSIS — Z992 Dependence on renal dialysis: Secondary | ICD-10-CM | POA: Diagnosis not present

## 2015-07-23 DIAGNOSIS — E1122 Type 2 diabetes mellitus with diabetic chronic kidney disease: Secondary | ICD-10-CM | POA: Diagnosis not present

## 2015-07-24 DIAGNOSIS — J449 Chronic obstructive pulmonary disease, unspecified: Secondary | ICD-10-CM | POA: Diagnosis not present

## 2015-07-24 DIAGNOSIS — N186 End stage renal disease: Secondary | ICD-10-CM | POA: Diagnosis not present

## 2015-07-24 DIAGNOSIS — J156 Pneumonia due to other aerobic Gram-negative bacteria: Secondary | ICD-10-CM | POA: Diagnosis not present

## 2015-07-24 DIAGNOSIS — J150 Pneumonia due to Klebsiella pneumoniae: Secondary | ICD-10-CM | POA: Diagnosis not present

## 2015-07-24 DIAGNOSIS — I10 Essential (primary) hypertension: Secondary | ICD-10-CM | POA: Diagnosis not present

## 2015-07-24 DIAGNOSIS — I4891 Unspecified atrial fibrillation: Secondary | ICD-10-CM | POA: Diagnosis not present

## 2015-07-25 ENCOUNTER — Telehealth: Payer: Self-pay | Admitting: *Deleted

## 2015-07-25 DIAGNOSIS — I5032 Chronic diastolic (congestive) heart failure: Secondary | ICD-10-CM | POA: Diagnosis not present

## 2015-07-25 DIAGNOSIS — J449 Chronic obstructive pulmonary disease, unspecified: Secondary | ICD-10-CM | POA: Diagnosis not present

## 2015-07-25 DIAGNOSIS — J189 Pneumonia, unspecified organism: Secondary | ICD-10-CM | POA: Diagnosis not present

## 2015-07-25 DIAGNOSIS — D638 Anemia in other chronic diseases classified elsewhere: Secondary | ICD-10-CM | POA: Diagnosis not present

## 2015-07-25 NOTE — Telephone Encounter (Signed)
[facility] faxed a request stating an urgent response was needed to complete forms for home health care certification.  I called [phone] and informed Lilia Pro to let Terry [name] know Dr Maudie Mercury responded to this one month ago as she cannot complete the forms as the pt was in the hospital,she did not order this and she has not seen him.

## 2015-07-26 DIAGNOSIS — N186 End stage renal disease: Secondary | ICD-10-CM | POA: Diagnosis not present

## 2015-07-26 DIAGNOSIS — J156 Pneumonia due to other aerobic Gram-negative bacteria: Secondary | ICD-10-CM | POA: Diagnosis not present

## 2015-07-26 DIAGNOSIS — J150 Pneumonia due to Klebsiella pneumoniae: Secondary | ICD-10-CM | POA: Diagnosis not present

## 2015-07-28 ENCOUNTER — Emergency Department (HOSPITAL_COMMUNITY): Payer: Medicare PPO

## 2015-07-28 ENCOUNTER — Inpatient Hospital Stay (HOSPITAL_COMMUNITY)
Admission: EM | Admit: 2015-07-28 | Discharge: 2015-08-03 | DRG: 190 | Disposition: A | Discharge status: Skilled Nursing Facility | Payer: Medicare PPO | Attending: Internal Medicine | Admitting: Internal Medicine

## 2015-07-28 ENCOUNTER — Encounter (HOSPITAL_COMMUNITY): Payer: Self-pay | Admitting: Emergency Medicine

## 2015-07-28 DIAGNOSIS — J440 Chronic obstructive pulmonary disease with acute lower respiratory infection: Secondary | ICD-10-CM | POA: Diagnosis not present

## 2015-07-28 DIAGNOSIS — I5033 Acute on chronic diastolic (congestive) heart failure: Secondary | ICD-10-CM | POA: Diagnosis present

## 2015-07-28 DIAGNOSIS — D500 Iron deficiency anemia secondary to blood loss (chronic): Secondary | ICD-10-CM | POA: Diagnosis not present

## 2015-07-28 DIAGNOSIS — N186 End stage renal disease: Secondary | ICD-10-CM | POA: Diagnosis present

## 2015-07-28 DIAGNOSIS — N2581 Secondary hyperparathyroidism of renal origin: Secondary | ICD-10-CM | POA: Diagnosis present

## 2015-07-28 DIAGNOSIS — Z8249 Family history of ischemic heart disease and other diseases of the circulatory system: Secondary | ICD-10-CM

## 2015-07-28 DIAGNOSIS — Z992 Dependence on renal dialysis: Secondary | ICD-10-CM | POA: Diagnosis not present

## 2015-07-28 DIAGNOSIS — Z87891 Personal history of nicotine dependence: Secondary | ICD-10-CM

## 2015-07-28 DIAGNOSIS — E1122 Type 2 diabetes mellitus with diabetic chronic kidney disease: Secondary | ICD-10-CM | POA: Diagnosis not present

## 2015-07-28 DIAGNOSIS — D638 Anemia in other chronic diseases classified elsewhere: Secondary | ICD-10-CM | POA: Diagnosis present

## 2015-07-28 DIAGNOSIS — I482 Chronic atrial fibrillation: Secondary | ICD-10-CM | POA: Diagnosis not present

## 2015-07-28 DIAGNOSIS — R0602 Shortness of breath: Secondary | ICD-10-CM | POA: Diagnosis not present

## 2015-07-28 DIAGNOSIS — Z955 Presence of coronary angioplasty implant and graft: Secondary | ICD-10-CM | POA: Diagnosis not present

## 2015-07-28 DIAGNOSIS — J9601 Acute respiratory failure with hypoxia: Secondary | ICD-10-CM | POA: Diagnosis present

## 2015-07-28 DIAGNOSIS — J449 Chronic obstructive pulmonary disease, unspecified: Secondary | ICD-10-CM

## 2015-07-28 DIAGNOSIS — I5043 Acute on chronic combined systolic (congestive) and diastolic (congestive) heart failure: Secondary | ICD-10-CM

## 2015-07-28 DIAGNOSIS — M7989 Other specified soft tissue disorders: Secondary | ICD-10-CM | POA: Diagnosis present

## 2015-07-28 DIAGNOSIS — I2510 Atherosclerotic heart disease of native coronary artery without angina pectoris: Secondary | ICD-10-CM | POA: Diagnosis present

## 2015-07-28 DIAGNOSIS — R918 Other nonspecific abnormal finding of lung field: Secondary | ICD-10-CM | POA: Diagnosis not present

## 2015-07-28 DIAGNOSIS — Z8673 Personal history of transient ischemic attack (TIA), and cerebral infarction without residual deficits: Secondary | ICD-10-CM | POA: Diagnosis not present

## 2015-07-28 DIAGNOSIS — I10 Essential (primary) hypertension: Secondary | ICD-10-CM | POA: Diagnosis present

## 2015-07-28 DIAGNOSIS — J189 Pneumonia, unspecified organism: Secondary | ICD-10-CM | POA: Diagnosis present

## 2015-07-28 DIAGNOSIS — I132 Hypertensive heart and chronic kidney disease with heart failure and with stage 5 chronic kidney disease, or end stage renal disease: Secondary | ICD-10-CM | POA: Diagnosis present

## 2015-07-28 DIAGNOSIS — Y95 Nosocomial condition: Secondary | ICD-10-CM | POA: Diagnosis present

## 2015-07-28 DIAGNOSIS — I5032 Chronic diastolic (congestive) heart failure: Secondary | ICD-10-CM | POA: Diagnosis not present

## 2015-07-28 DIAGNOSIS — Z820 Family history of epilepsy and other diseases of the nervous system: Secondary | ICD-10-CM

## 2015-07-28 DIAGNOSIS — D631 Anemia in chronic kidney disease: Secondary | ICD-10-CM | POA: Diagnosis present

## 2015-07-28 DIAGNOSIS — M6281 Muscle weakness (generalized): Secondary | ICD-10-CM | POA: Diagnosis not present

## 2015-07-28 DIAGNOSIS — J9621 Acute and chronic respiratory failure with hypoxia: Secondary | ICD-10-CM | POA: Diagnosis present

## 2015-07-28 DIAGNOSIS — I120 Hypertensive chronic kidney disease with stage 5 chronic kidney disease or end stage renal disease: Secondary | ICD-10-CM | POA: Diagnosis not present

## 2015-07-28 DIAGNOSIS — K922 Gastrointestinal hemorrhage, unspecified: Secondary | ICD-10-CM | POA: Diagnosis not present

## 2015-07-28 DIAGNOSIS — R069 Unspecified abnormalities of breathing: Secondary | ICD-10-CM | POA: Diagnosis not present

## 2015-07-28 DIAGNOSIS — Z809 Family history of malignant neoplasm, unspecified: Secondary | ICD-10-CM | POA: Diagnosis not present

## 2015-07-28 HISTORY — DX: Reserved for inherently not codable concepts without codable children: IMO0001

## 2015-07-28 LAB — CBC
HCT: 37.9 % — ABNORMAL LOW (ref 39.0–52.0)
HEMOGLOBIN: 12.2 g/dL — AB (ref 13.0–17.0)
MCH: 30.2 pg (ref 26.0–34.0)
MCHC: 32.2 g/dL (ref 30.0–36.0)
MCV: 93.8 fL (ref 78.0–100.0)
Platelets: 265 10*3/uL (ref 150–400)
RBC: 4.04 MIL/uL — ABNORMAL LOW (ref 4.22–5.81)
RDW: 18.8 % — AB (ref 11.5–15.5)
WBC: 16.1 10*3/uL — ABNORMAL HIGH (ref 4.0–10.5)

## 2015-07-28 LAB — I-STAT ARTERIAL BLOOD GAS, ED
Acid-Base Excess: 1 mmol/L (ref 0.0–2.0)
Bicarbonate: 25.1 mEq/L — ABNORMAL HIGH (ref 20.0–24.0)
O2 Saturation: 89 %
PCO2 ART: 38.3 mmHg (ref 35.0–45.0)
PH ART: 7.424 (ref 7.350–7.450)
Patient temperature: 98.6
TCO2: 26 mmol/L (ref 0–100)
pO2, Arterial: 55 mmHg — ABNORMAL LOW (ref 80.0–100.0)

## 2015-07-28 LAB — BRAIN NATRIURETIC PEPTIDE: B Natriuretic Peptide: 432.6 pg/mL — ABNORMAL HIGH (ref 0.0–100.0)

## 2015-07-28 LAB — BASIC METABOLIC PANEL
ANION GAP: 13 (ref 5–15)
BUN: 57 mg/dL — AB (ref 6–20)
CALCIUM: 8.7 mg/dL — AB (ref 8.9–10.3)
CO2: 27 mmol/L (ref 22–32)
CREATININE: 3.73 mg/dL — AB (ref 0.61–1.24)
Chloride: 90 mmol/L — ABNORMAL LOW (ref 101–111)
GFR calc Af Amer: 17 mL/min — ABNORMAL LOW (ref >60)
GFR, EST NON AFRICAN AMERICAN: 14 mL/min — AB (ref >60)
GLUCOSE: 157 mg/dL — AB (ref 65–99)
Potassium: 4.9 mmol/L (ref 3.5–5.1)
Sodium: 130 mmol/L — ABNORMAL LOW (ref 135–145)

## 2015-07-28 LAB — PROTIME-INR
INR: 1.05 (ref 0.00–1.49)
Prothrombin Time: 13.9 seconds (ref 11.6–15.2)

## 2015-07-28 LAB — TROPONIN I: Troponin I: 0.14 ng/mL — ABNORMAL HIGH (ref <0.031)

## 2015-07-28 LAB — I-STAT CG4 LACTIC ACID, ED: Lactic Acid, Venous: 2.13 mmol/L (ref 0.5–2.0)

## 2015-07-28 MED ORDER — VANCOMYCIN HCL IN DEXTROSE 1-5 GM/200ML-% IV SOLN
1000.0000 mg | Freq: Once | INTRAVENOUS | Status: AC
  Administered 2015-07-28: 1000 mg via INTRAVENOUS
  Filled 2015-07-28: qty 200

## 2015-07-28 MED ORDER — IPRATROPIUM-ALBUTEROL 0.5-2.5 (3) MG/3ML IN SOLN
3.0000 mL | Freq: Once | RESPIRATORY_TRACT | Status: AC
  Administered 2015-07-28: 3 mL via RESPIRATORY_TRACT
  Filled 2015-07-28: qty 3

## 2015-07-28 MED ORDER — DEXTROSE 5 % IV SOLN
1.0000 g | INTRAVENOUS | Status: DC
  Administered 2015-07-28: 1 g via INTRAVENOUS
  Filled 2015-07-28 (×2): qty 1

## 2015-07-28 MED ORDER — METHYLPREDNISOLONE SODIUM SUCC 125 MG IJ SOLR
125.0000 mg | Freq: Once | INTRAMUSCULAR | Status: AC
  Administered 2015-07-28: 125 mg via INTRAVENOUS
  Filled 2015-07-28: qty 2

## 2015-07-28 NOTE — Progress Notes (Signed)
Pharmacy Antibiotic Note Terry [name] is a 81 y.o. male admitted on 07/28/2015 with pneumonia in setting of ESRD. Pharmacy has been consulted for Cefepime dosing.  Plan: 1. Cefepime 1 gram IV q 24 hours for now   Recent Labs Lab 07/28/15 2135 07/28/15 2156  WBC 16.1*  --   LATICACIDVEN  --  2.13*      Allergies  Allergen Reactions  . Yellow Dyes (Non-Tartrazine) Other (See Comments)    Burns arms Cough medications (tussionex and benzonatate ok)  . Levaquin [Levofloxacin In D5w] Itching    Antimicrobials this admission: 2/5 Cefepime >>  2/5 Vancomycin x 1 dose   Dose adjustments this admission: n/a  Microbiology results: Px   Vincenza Hews, PharmD, BCPS 07/28/2015, 10:24 PM Pager: [phone]

## 2015-07-28 NOTE — H&P (Signed)
[facility] History and Physical  Terry [name] [id number] DOB: Jul 01, 1934 DOA: 07/28/2015  Referring physician: Dr. Vallery Ridge. PCP: Terry [name]., DO  Specialists: Nephrologist for dialysis.  Chief Complaint: Shortness of breath.  HPI: Terry [name] is a 81 y.o. male with history of ESRD on hemodialysis on Monday Wednesday and Friday, CAD status post stenting, chronic atrial fibrillation was admitted last month for acute respiratory failure requiring intubation and also had complication with pneumothorax requiring chest tube and GI bleed and anticoagulation for A. fib was discontinued at that time presents to the ER because of sudden onset of shortness of breath since yesterday morning. Patient has been having productive cough off and on for last 1 week and patient states he was placed on oral antibiotics. Despite taking which patient was having shortness of breath with productive cough. Patient was initially placed on BiPAP and was weaned off from the ER. Chest x-ray shows infiltrates patient has been admitted for acute respiratory failure probably from pneumonia and possible mild fluid overload. On exam patient has bilateral lower extremity edema which patient states has been recently increased. Patient states he has not missed his dialysis.   Review of Systems: As presented in the history of presenting illness, rest negative.  Past Medical History  Diagnosis Date  . Stroke ([facility]) 2012  . High cholesterol   . Hypertension   . Kidney disease     CKD4, sees [place] [facility], considering dialysis  . COPD (chronic obstructive pulmonary disease) ([place])   . CAD (coronary artery disease) [name], [name]  . Acute and chronic respiratory failure 10/22/2012  . Osteoarthritis 10/22/2012  . Pneumonia April 2014  . Atrial fibrillation [facility])    Past Surgical History  Procedure Laterality Date  . Coronary stent placement  1999    2 stents  . Appendectomy  1984  . Colon surgery  2012     partial colon removed - twisted bowel  . Hernia repair      Right inguinal  . Cystoscopy/retrograde/ureteroscopy Bilateral 04/23/2014    Procedure: CYSTOSCOPY BILATERAL RETROGRADE,  LEFT URETEROSCOPY, LEFT STENT PLACEMENT;  Surgeon: Terry Bring, MD;  Location: [facility];  Service: Urology;  Laterality: Bilateral;  . Av fistula placement Right 05/21/2015    Procedure: Right Arm Brachiocephalic ARTERIOVENOUS (AV) FISTULA CREATION;  Surgeon: Terry Mould, MD;  Location: [place];  Service: Vascular;  Laterality: Right;   Social History:  reports that he quit smoking about 15 years ago. His smoking use included Cigarettes, Pipe, and Cigars. He has a 49 pack-year smoking history. He has never used smokeless tobacco. He reports that he does not drink alcohol or use illicit drugs. Where does patient live home. Can patient participate in ADLs? Yes.  Allergies  Allergen Reactions  . Yellow Dyes (Non-Tartrazine) Other (See Comments)    Burns arms Cough medications (tussionex and benzonatate ok)  . Levaquin [Levofloxacin In D5w] Itching    Family History:  Family History  Problem Relation Age of Onset  . Cancer Mother     Breast  . Heart disease Father   . Heart attack Father   . Parkinson's disease Brother       Prior to Admission medications   Medication Sig Start Date End Date Taking? Authorizing Provider  albuterol (VENTOLIN HFA) 108 (90 BASE) MCG/ACT inhaler Inhale 2 puffs into the lungs every 4 (four) hours as needed for wheezing or shortness of breath. 05/09/15  Yes Terry Kern, DO  amLODipine (  NORVASC) 10 MG tablet Take 1 tablet (10 mg total) by mouth daily. 06/06/15  Yes Terry Blanks, MD  amoxicillin-clavulanate (AUGMENTIN) 875-125 MG tablet Take 1 tablet by mouth 2 (two) times daily. 10 day course started 07/24/15 pm   Yes Historical Provider, MD  atorvastatin (LIPITOR) 20 MG tablet Take 1 tablet (20 mg total) by mouth daily. Patient taking differently: Take 20 mg by  mouth daily at 6 PM.  03/25/15  Yes Terry Blanks, MD  benzonatate (TESSALON) 200 MG capsule Take 1 capsule (200 mg total) by mouth 3 (three) times daily as needed for cough. 06/04/15  Yes Terry Krystal Eaton, MD  budesonide-formoterol (SYMBICORT) 160-4.5 MCG/ACT inhaler Inhale 2 puffs into the lungs 2 (two) times daily. 11/07/14  Yes Tanda Rockers, MD  cholecalciferol (VITAMIN D) 1000 units tablet Take 1,000 Units by mouth daily.   Yes Historical Provider, MD  clonazePAM (KLONOPIN) 1 MG tablet Take 1 tablet (1 mg total) by mouth daily as needed for anxiety (for HD treatment). 06/04/15  Yes Terry Krystal Eaton, MD  guaiFENesin-dextromethorphan (ROBITUSSIN DM) 100-10 MG/5ML syrup Take 5 mLs by mouth every 4 (four) hours as needed for cough. 06/04/15  Yes Terry Krystal Eaton, MD  HYDROcodone-homatropine (HYCODAN) 5-1.5 MG/5ML syrup Take 5 mLs by mouth every 6 (six) hours as needed for cough.   Yes Historical Provider, MD  ipratropium-albuterol (DUONEB) 0.5-2.5 (3) MG/3ML SOLN Take 3 mLs by nebulization 3 (three) times daily. Patient taking differently: Take 3 mLs by nebulization 3 (three) times daily. 9am, 2pm, 9pm 06/04/15  Yes Terry Krystal Eaton, MD  methocarbamol (ROBAXIN) 500 MG tablet Take 1 tablet (500 mg total) by mouth every 8 (eight) hours as needed for muscle spasms. 06/04/15  Yes Terry Krystal Eaton, MD  metoprolol tartrate (LOPRESSOR) 25 MG tablet Take 1 tablet (25 mg total) by mouth 2 (two) times daily. Patient taking differently: Take 25 mg by mouth 2 (two) times daily. 8am, 5pm 06/04/15  Yes Terry Krystal Eaton, MD  Nutritional Supplements (NEPRO) LIQD Take 1 Can by mouth 2 (two) times daily before lunch and supper.   Yes Historical Provider, MD  pantoprazole (PROTONIX) 40 MG tablet Take 1 tablet (40 mg total) by mouth 2 (two) times daily before a meal. Patient taking differently: Take 40 mg by mouth daily.  07/04/15  Yes Terry Odea, MD  polyethylene glycol (MIRALAX) packet Take 17 g by mouth daily as needed  for moderate constipation. 06/04/15  Yes Terry Krystal Eaton, MD  senna-docusate (SENOKOT S) 8.6-50 MG tablet Take 2 tablets by mouth at bedtime as needed for mild constipation. 06/04/15  Yes Terry Krystal Eaton, MD  sevelamer carbonate (RENVELA) 800 MG tablet Take 1 tablet (800 mg total) by mouth 3 (three) times daily with meals. 07/04/15  Yes Terry Odea, MD  tiotropium (SPIRIVA HANDIHALER) 18 MCG inhalation capsule INHALE THE CONTENTS OF ONE CAPSULE INTO LUNGS ONCE DAILY Patient taking differently: Place 18 mcg into inhaler and inhale daily.  04/25/15  Yes Tanda Rockers, MD    Physical Exam: Filed Vitals:   07/28/15 2100 07/28/15 2101 07/28/15 2102 07/28/15 2157  BP: 134/62     Pulse: 100 98 95   Resp: 30  21   SpO2: 98%   97%     General:  Moderately built and nourished.  Eyes: Anicteric no pallor.  ENT: No discharge from the ears eyes nose or mouth.  Neck: No mass felt.  Cardiovascular: S1 and S2 heard.  Respiratory: No rhonchi  or crepitations.  Abdomen: Soft nontender bowel sounds present.  Skin: Chronic skin changes.  Musculoskeletal: Bilateral lower extremity edema.  Psychiatric: Appears normal.  Neurologic: Alert awake oriented to time place and person. Moves all extremities.  Labs on Admission:  Basic Metabolic Panel:  Recent Labs Lab 07/28/15 2135  NA 130*  K 4.9  CL 90*  CO2 27  GLUCOSE 157*  BUN 57*  CREATININE 3.73*  CALCIUM 8.7*   Liver Function Tests: No results for input(s): AST, ALT, ALKPHOS, BILITOT, PROT, ALBUMIN in the last 168 hours. No results for input(s): LIPASE, AMYLASE in the last 168 hours. No results for input(s): AMMONIA in the last 168 hours. CBC:  Recent Labs Lab 07/28/15 2135  WBC 16.1*  HGB 12.2*  HCT 37.9*  MCV 93.8  PLT 265   Cardiac Enzymes:  Recent Labs Lab 07/28/15 2135  TROPONINI 0.14*    BNP (last 3 results)  Recent Labs  05/13/15 2017 06/08/15 0016 07/28/15 2135  BNP 362.1* 892.3* 432.6*    ProBNP  (last 3 results) No results for input(s): PROBNP in the last 8760 hours.  CBG: No results for input(s): GLUCAP in the last 168 hours.  Radiological Exams on Admission: Dg Chest Port 1 View  07/28/2015  CLINICAL DATA:  Acute onset of dyspnea.  Initial encounter. EXAM: PORTABLE CHEST 1 VIEW COMPARISON:  Chest radiograph performed 07/01/2015 FINDINGS: The lungs are well expanded. Right basilar airspace opacity raises concern for pneumonia. No pleural effusion or pneumothorax is seen. The cardiomediastinal silhouette is borderline normal in size. A right-sided dual-lumen catheter is noted ending about the distal SVC. No acute osseous abnormalities are seen. IMPRESSION: Right basilar airspace opacity is concerning for pneumonia. Electronically Signed   By: Garald Balding M.D.   On: 07/28/2015 21:47    EKG: Independently reviewed. A. fib with rate control.  Assessment/Plan Principal Problem:   Acute respiratory failure with hypoxia (HCC) Active Problems:   COPD GOLD III   Essential hypertension   End-stage renal disease on hemodialysis ([place])   Chronic diastolic heart failure (HCC)   Acute respiratory failure ([place])   1. Acute respiratory failure with hypoxia probably secondary to pneumonia with a contribution of fluid overload - patient has been placed on vancomycin and cefepime for healthcare associated pneumonia since patient was recently in the hospital. Follow sputum cultures. Will need to consult nephrology in a.m. for dialysis. 2. ESRD on hemodialysis on Monday Wednesday and Friday - consult nephrology for dialysis in a.m. 3. Bilateral lower extremity edema - we'll check Dopplers to rule out DVT. 4. CAD status post stenting - denies any chest pain but point of care troponin is mildly elevated. Cycle cardiac markers. 5. COPD - presently not wheezing. 6. Chronic A. fib rate controlled - not on anticoagulants secondary to recent GI bleed. 7. Chronic anemia with recent GI bleed - follow  CBC. 8. COPD presenting on wheezing.   DVT Prophylaxis heparin.  Code Status: Full code.  Family Communication: Patient's wife.  Disposition Plan: Admit to inpatient.    Jayel Inks N. [facility] Pager [phone].  If 7PM-7AM, please contact night-coverage [email or web address] Password [email or web address] 07/28/2015, 11:44 PM

## 2015-07-28 NOTE — ED Notes (Signed)
Per [facility] pt from [facility] 2 in chest Fistula in R arm MWF  CPAP 10-15 min. Pt no on nonrebreather 89%. 94-95% on CPAP. Rhonchi lower lobes  Emphysema on Lower lobes and history of COPD. Pneumonia diagnosed 1 month ago and has returned. Cough  Breathing difficulties for the last 3 days  "possible collapsed lung the last time he was on CPAP.  AFIB

## 2015-07-28 NOTE — ED Provider Notes (Signed)
CSN: [id number]     Arrival date & time 07/28/15  2045 History   First MD Initiated Contact with Patient 07/28/15 2049     Chief Complaint  Patient presents with  . Shortness of Breath     (Consider location/radiation/quality/duration/timing/severity/associated sxs/prior Treatment) HPI Patient is at [name] is trying to recover from COPD and pneumonia. He had a prolonged hospitalization. It just over the past week the patient was starting to have some improvement. Ports today however he had become much more short of breath and just started to feel bad. He can't quantify in what respect he felt bad, but he did not have chest pain or vomiting. Patient is on Monday Wednesday Friday dialysis. He reports he had dialysis on Friday he reports his legs are swelling quite a bit. That is chronic but he has noticed increased swelling in his feet and legs. At baseline, he is a gotten to the point that he is now using a walker to move around this week. He is undergoing rehabilitation therapy. Past Medical History  Diagnosis Date  . Stroke ([place]) 2012  . High cholesterol   . Hypertension   . Kidney disease     CKD4, sees [place] [facility], considering dialysis  . COPD (chronic obstructive pulmonary disease) ([place])   . CAD (coronary artery disease) [place], [name]  . Acute and chronic respiratory failure 10/22/2012  . Osteoarthritis 10/22/2012  . Pneumonia April 2014  . Atrial fibrillation [facility])    Past Surgical History  Procedure Laterality Date  . Coronary stent placement  1999    2 stents  . Appendectomy  1984  . Colon surgery  2012    partial colon removed - twisted bowel  . Hernia repair      Right inguinal  . Cystoscopy/retrograde/ureteroscopy Bilateral 04/23/2014    Procedure: CYSTOSCOPY BILATERAL RETROGRADE,  LEFT URETEROSCOPY, LEFT STENT PLACEMENT;  Surgeon: Raynelle Bring, MD;  Location: [facility] ORS;  Service: Urology;  Laterality: Bilateral;  . Av fistula placement Right 05/21/2015   Procedure: Right Arm Brachiocephalic ARTERIOVENOUS (AV) FISTULA CREATION;  Surgeon: Angelia Mould, MD;  Location: [facility] OR;  Service: Vascular;  Laterality: Right;   Family History  Problem Relation Age of Onset  . Cancer Mother     Breast  . Heart disease Father   . Heart attack Father   . Parkinson's disease Brother    Social History  Substance Use Topics  . Smoking status: Former Smoker -- 1.00 packs/day for 49 years    Types: Cigarettes, Pipe, Cigars    Quit date: 02/21/2000  . Smokeless tobacco: Never Used  . Alcohol Use: No    Review of Systems  10 Systems reviewed and are negative for acute change except as noted in the HPI.   Allergies  Yellow dyes (non-tartrazine) and Levaquin  Home Medications   Prior to Admission medications   Medication Sig Start Date End Date Taking? Authorizing Provider  albuterol (VENTOLIN HFA) 108 (90 BASE) MCG/ACT inhaler Inhale 2 puffs into the lungs every 4 (four) hours as needed for wheezing or shortness of breath. 05/09/15  Yes [name], DO  amLODipine (NORVASC) 10 MG tablet Take 1 tablet (10 mg total) by mouth daily. 06/06/15  Yes Burnell Blanks, MD  amoxicillin-clavulanate (AUGMENTIN) 875-125 MG tablet Take 1 tablet by mouth 2 (two) times daily. 10 day course started 07/24/15 pm   Yes Historical Provider, MD  atorvastatin (LIPITOR) 20 MG tablet Take 1 tablet (20 mg total)  by mouth daily. Patient taking differently: Take 20 mg by mouth daily at 6 PM.  03/25/15  Yes Burnell Blanks, MD  benzonatate (TESSALON) 200 MG capsule Take 1 capsule (200 mg total) by mouth 3 (three) times daily as needed for cough. 06/04/15  Yes Ripudeep Krystal Eaton, MD  budesonide-formoterol (SYMBICORT) 160-4.5 MCG/ACT inhaler Inhale 2 puffs into the lungs 2 (two) times daily. 11/07/14  Yes Tanda Rockers, MD  cholecalciferol (VITAMIN D) 1000 units tablet Take 1,000 Units by mouth daily.   Yes Historical Provider, MD  clonazePAM (KLONOPIN) 1 MG tablet  Take 1 tablet (1 mg total) by mouth daily as needed for anxiety (for HD treatment). 06/04/15  Yes Ripudeep Krystal Eaton, MD  guaiFENesin-dextromethorphan (ROBITUSSIN DM) 100-10 MG/5ML syrup Take 5 mLs by mouth every 4 (four) hours as needed for cough. 06/04/15  Yes Ripudeep Krystal Eaton, MD  HYDROcodone-homatropine (HYCODAN) 5-1.5 MG/5ML syrup Take 5 mLs by mouth every 6 (six) hours as needed for cough.   Yes Historical Provider, MD  ipratropium-albuterol (DUONEB) 0.5-2.5 (3) MG/3ML SOLN Take 3 mLs by nebulization 3 (three) times daily. Patient taking differently: Take 3 mLs by nebulization 3 (three) times daily. 9am, 2pm, 9pm 06/04/15  Yes Ripudeep Krystal Eaton, MD  methocarbamol (ROBAXIN) 500 MG tablet Take 1 tablet (500 mg total) by mouth every 8 (eight) hours as needed for muscle spasms. 06/04/15  Yes Ripudeep Krystal Eaton, MD  metoprolol tartrate (LOPRESSOR) 25 MG tablet Take 1 tablet (25 mg total) by mouth 2 (two) times daily. Patient taking differently: Take 25 mg by mouth 2 (two) times daily. 8am, 5pm 06/04/15  Yes Ripudeep Krystal Eaton, MD  Nutritional Supplements (NEPRO) LIQD Take 1 Can by mouth 2 (two) times daily before lunch and supper.   Yes Historical Provider, MD  pantoprazole (PROTONIX) 40 MG tablet Take 1 tablet (40 mg total) by mouth 2 (two) times daily before a meal. Patient taking differently: Take 40 mg by mouth daily.  07/04/15  Yes Debbe Odea, MD  polyethylene glycol (MIRALAX) packet Take 17 g by mouth daily as needed for moderate constipation. 06/04/15  Yes Ripudeep Krystal Eaton, MD  senna-docusate (SENOKOT S) 8.6-50 MG tablet Take 2 tablets by mouth at bedtime as needed for mild constipation. 06/04/15  Yes Ripudeep Krystal Eaton, MD  sevelamer carbonate (RENVELA) 800 MG tablet Take 1 tablet (800 mg total) by mouth 3 (three) times daily with meals. 07/04/15  Yes Debbe Odea, MD  tiotropium (SPIRIVA HANDIHALER) 18 MCG inhalation capsule INHALE THE CONTENTS OF ONE CAPSULE INTO LUNGS ONCE DAILY Patient taking differently: Place  18 mcg into inhaler and inhale daily.  04/25/15  Yes Tanda Rockers, MD   BP 134/62 mmHg  Pulse 95  Resp 21  SpO2 97% Physical Exam  Constitutional: He is oriented to person, place, and time.  Patient is very deconditioned in appearance. He is moderately dyspneic. Alert and nontoxic.  HENT:  Head: Normocephalic and atraumatic.  Eyes: EOM are normal. Pupils are equal, round, and reactive to light.  Cardiovascular:  Very distant heart sounds. Irregularly irregular. 2+ radial pulses.  Pulmonary/Chest:  Mild to moderate increased work of breathing. Soft breath sounds at bases. Occasional fine expiratory wheeze. Air general air flow. Rhonchi left anterior auscultation.  Abdominal: Soft. Bowel sounds are normal. He exhibits no distension. There is no tenderness.  Musculoskeletal: He exhibits edema.  3+ pitting edema bilateral lower extremities and feet.  Neurological: He is alert and oriented to person, place, and time. Coordination  normal.  Skin: Skin is warm and dry. There is pallor.  Many senile ecchymoses.  Psychiatric: He has a normal mood and affect.    ED Course  Procedures (including critical care time) Labs Review Labs Reviewed  BASIC METABOLIC PANEL - Abnormal; Notable for the following:    Sodium 130 (*)    Chloride 90 (*)    Glucose, Bld 157 (*)    BUN 57 (*)    Creatinine, Ser 3.73 (*)    Calcium 8.7 (*)    GFR calc non Af Amer 14 (*)    GFR calc Af Amer 17 (*)    All other components within normal limits  CBC - Abnormal; Notable for the following:    WBC 16.1 (*)    RBC 4.04 (*)    Hemoglobin 12.2 (*)    HCT 37.9 (*)    RDW 18.8 (*)    All other components within normal limits  BRAIN NATRIURETIC PEPTIDE - Abnormal; Notable for the following:    B Natriuretic Peptide 432.6 (*)    All other components within normal limits  I-STAT CG4 LACTIC ACID, ED - Abnormal; Notable for the following:    Lactic Acid, Venous 2.13 (*)    All other components within normal  limits  I-STAT ARTERIAL BLOOD GAS, ED - Abnormal; Notable for the following:    pO2, Arterial 55.0 (*)    Bicarbonate 25.1 (*)    All other components within normal limits  CULTURE, BLOOD (ROUTINE X 2)  CULTURE, BLOOD (ROUTINE X 2)  PROTIME-INR  TROPONIN I  BLOOD GAS, ARTERIAL    Imaging Review Dg Chest Port 1 View  07/28/2015  CLINICAL DATA:  Acute onset of dyspnea.  Initial encounter. EXAM: PORTABLE CHEST 1 VIEW COMPARISON:  Chest radiograph performed 07/01/2015 FINDINGS: The lungs are well expanded. Right basilar airspace opacity raises concern for pneumonia. No pleural effusion or pneumothorax is seen. The cardiomediastinal silhouette is borderline normal in size. A right-sided dual-lumen catheter is noted ending about the distal SVC. No acute osseous abnormalities are seen. IMPRESSION: Right basilar airspace opacity is concerning for pneumonia. Electronically Signed   By: Garald Balding M.D.   On: 07/28/2015 21:47   I have personally reviewed and evaluated these images and lab results as part of my medical decision-making.   EKG Interpretation   Date/Time:  Sunday July 28 2015 21:07:34 EST Ventricular Rate:  99 PR Interval:    QRS Duration: 81 QT Interval:  327 QTC Calculation: 420 R Axis:   33 Text Interpretation:  Atrial fibrillation Borderline low voltage,  extremity leads agree. no sig change rom old Confirmed by Johnney Killian, MD,  Jeannie Done [phone]) on 07/28/2015 10:45:25 PM     Consult: Hospitalist was consult for admission. MDM   Final diagnoses:  HCAP (healthcare-associated pneumonia)  Chronic obstructive pulmonary disease, unspecified COPD type ([place])   Patient had dyspnea with respiratory distress and general malaise this evening. He has severe comorbid illness of COPD, recent pneumonia with intubation and ESRD. Initially patient had been placed on CPAP By EMS. By the time of his arrival, he was improved and placed on first nonrebreather mask and then nasal cannula O2.  Chest x-ray is consistent with pneumonia and patient has leukocytosis. He will be initiated on treatment for healthcare associated pneumonia. At this time he is still finishing a course of Augmentin as an outpatient.    Charlesetta Shanks, MD 07/28/15 2250

## 2015-07-29 ENCOUNTER — Inpatient Hospital Stay (HOSPITAL_COMMUNITY): Admit: 2015-07-29 | Payer: Medicare PPO

## 2015-07-29 ENCOUNTER — Encounter (HOSPITAL_COMMUNITY): Payer: Self-pay | Admitting: General Practice

## 2015-07-29 LAB — COMPREHENSIVE METABOLIC PANEL
ALBUMIN: 2.9 g/dL — AB (ref 3.5–5.0)
ALT: 19 U/L (ref 17–63)
AST: 22 U/L (ref 15–41)
Alkaline Phosphatase: 60 U/L (ref 38–126)
Anion gap: 15 (ref 5–15)
BUN: 61 mg/dL — AB (ref 6–20)
CHLORIDE: 92 mmol/L — AB (ref 101–111)
CO2: 23 mmol/L (ref 22–32)
CREATININE: 4.01 mg/dL — AB (ref 0.61–1.24)
Calcium: 8.4 mg/dL — ABNORMAL LOW (ref 8.9–10.3)
GFR calc Af Amer: 15 mL/min — ABNORMAL LOW (ref >60)
GFR calc non Af Amer: 13 mL/min — ABNORMAL LOW (ref >60)
GLUCOSE: 206 mg/dL — AB (ref 65–99)
POTASSIUM: 4.5 mmol/L (ref 3.5–5.1)
SODIUM: 130 mmol/L — AB (ref 135–145)
Total Bilirubin: 0.8 mg/dL (ref 0.3–1.2)
Total Protein: 6.2 g/dL — ABNORMAL LOW (ref 6.5–8.1)

## 2015-07-29 LAB — CBC WITH DIFFERENTIAL/PLATELET
Basophils Absolute: 0 10*3/uL (ref 0.0–0.1)
Basophils Relative: 0 %
EOS ABS: 0 10*3/uL (ref 0.0–0.7)
EOS PCT: 0 %
HCT: 37.8 % — ABNORMAL LOW (ref 39.0–52.0)
HEMOGLOBIN: 12 g/dL — AB (ref 13.0–17.0)
LYMPHS ABS: 0.5 10*3/uL — AB (ref 0.7–4.0)
LYMPHS PCT: 4 %
MCH: 29.9 pg (ref 26.0–34.0)
MCHC: 31.7 g/dL (ref 30.0–36.0)
MCV: 94.3 fL (ref 78.0–100.0)
MONOS PCT: 1 %
Monocytes Absolute: 0.2 10*3/uL (ref 0.1–1.0)
Neutro Abs: 13.5 10*3/uL — ABNORMAL HIGH (ref 1.7–7.7)
Neutrophils Relative %: 95 %
PLATELETS: 228 10*3/uL (ref 150–400)
RBC: 4.01 MIL/uL — AB (ref 4.22–5.81)
RDW: 19.2 % — ABNORMAL HIGH (ref 11.5–15.5)
WBC: 14.2 10*3/uL — ABNORMAL HIGH (ref 4.0–10.5)

## 2015-07-29 LAB — TROPONIN I: Troponin I: <0.03 ng/mL (ref <0.031)

## 2015-07-29 LAB — MRSA PCR SCREENING: MRSA by PCR: POSITIVE — AB

## 2015-07-29 LAB — EXPECTORATED SPUTUM ASSESSMENT W REFEX TO RESP CULTURE

## 2015-07-29 LAB — EXPECTORATED SPUTUM ASSESSMENT W GRAM STAIN, RFLX TO RESP C

## 2015-07-29 LAB — HIV ANTIBODY (ROUTINE TESTING W REFLEX): HIV SCREEN 4TH GENERATION: NONREACTIVE

## 2015-07-29 MED ORDER — HEPARIN SODIUM (PORCINE) 5000 UNIT/ML IJ SOLN
5000.0000 [IU] | Freq: Three times a day (TID) | INTRAMUSCULAR | Status: DC
  Administered 2015-07-29 – 2015-08-01 (×9): 5000 [IU] via SUBCUTANEOUS
  Filled 2015-07-29 (×9): qty 1

## 2015-07-29 MED ORDER — CLONAZEPAM 1 MG PO TABS
1.0000 mg | ORAL_TABLET | Freq: Every day | ORAL | Status: DC | PRN
  Administered 2015-07-31 – 2015-08-02 (×3): 1 mg via ORAL
  Filled 2015-07-29: qty 1

## 2015-07-29 MED ORDER — SENNOSIDES-DOCUSATE SODIUM 8.6-50 MG PO TABS
2.0000 | ORAL_TABLET | Freq: Every evening | ORAL | Status: DC | PRN
  Administered 2015-07-29 – 2015-08-03 (×5): 2 via ORAL
  Filled 2015-07-29 (×6): qty 2

## 2015-07-29 MED ORDER — AMLODIPINE BESYLATE 10 MG PO TABS
10.0000 mg | ORAL_TABLET | Freq: Every day | ORAL | Status: DC
  Administered 2015-07-30 – 2015-08-01 (×3): 10 mg via ORAL
  Filled 2015-07-29 (×5): qty 1

## 2015-07-29 MED ORDER — SODIUM CHLORIDE 0.9% FLUSH
3.0000 mL | Freq: Two times a day (BID) | INTRAVENOUS | Status: DC
  Administered 2015-07-29 – 2015-08-01 (×6): 3 mL via INTRAVENOUS

## 2015-07-29 MED ORDER — VANCOMYCIN HCL 500 MG IV SOLR
500.0000 mg | Freq: Once | INTRAVENOUS | Status: AC
  Administered 2015-07-29: 500 mg via INTRAVENOUS
  Filled 2015-07-29: qty 500

## 2015-07-29 MED ORDER — VANCOMYCIN HCL IN DEXTROSE 750-5 MG/150ML-% IV SOLN
750.0000 mg | INTRAVENOUS | Status: AC
  Administered 2015-07-29 – 2015-08-02 (×3): 750 mg via INTRAVENOUS
  Filled 2015-07-29 (×4): qty 150

## 2015-07-29 MED ORDER — GUAIFENESIN-DM 100-10 MG/5ML PO SYRP
5.0000 mL | ORAL_SOLUTION | ORAL | Status: DC | PRN
  Administered 2015-07-30 – 2015-08-02 (×5): 5 mL via ORAL
  Filled 2015-07-29 (×8): qty 5

## 2015-07-29 MED ORDER — NEPRO/CARBSTEADY PO LIQD
237.0000 mL | Freq: Two times a day (BID) | ORAL | Status: DC
  Administered 2015-07-31 – 2015-08-01 (×3): 237 mL via ORAL
  Filled 2015-07-29: qty 237

## 2015-07-29 MED ORDER — ACETAMINOPHEN 325 MG PO TABS
650.0000 mg | ORAL_TABLET | Freq: Four times a day (QID) | ORAL | Status: DC | PRN
  Filled 2015-07-29: qty 2

## 2015-07-29 MED ORDER — ALTEPLASE 2 MG IJ SOLR
2.0000 mg | Freq: Once | INTRAMUSCULAR | Status: DC | PRN
  Filled 2015-07-29: qty 2

## 2015-07-29 MED ORDER — LIDOCAINE-PRILOCAINE 2.5-2.5 % EX CREA
1.0000 "application " | TOPICAL_CREAM | CUTANEOUS | Status: DC | PRN
  Filled 2015-07-29: qty 5

## 2015-07-29 MED ORDER — ATORVASTATIN CALCIUM 20 MG PO TABS
20.0000 mg | ORAL_TABLET | Freq: Every day | ORAL | Status: DC
  Administered 2015-07-29 – 2015-08-01 (×4): 20 mg via ORAL
  Filled 2015-07-29 (×4): qty 1

## 2015-07-29 MED ORDER — ALBUTEROL SULFATE (2.5 MG/3ML) 0.083% IN NEBU: 2.5000 mg | INHALATION_SOLUTION | RESPIRATORY_TRACT | Status: DC | PRN

## 2015-07-29 MED ORDER — ALBUTEROL SULFATE (2.5 MG/3ML) 0.083% IN NEBU
2.5000 mg | INHALATION_SOLUTION | RESPIRATORY_TRACT | Status: DC
  Administered 2015-07-29 (×2): 2.5 mg via RESPIRATORY_TRACT
  Filled 2015-07-29 (×3): qty 3

## 2015-07-29 MED ORDER — ACETAMINOPHEN 650 MG RE SUPP: 650.0000 mg | Freq: Four times a day (QID) | RECTAL | Status: DC | PRN

## 2015-07-29 MED ORDER — PENTAFLUOROPROP-TETRAFLUOROETH EX AERO: 1.0000 "application " | INHALATION_SPRAY | CUTANEOUS | Status: DC | PRN

## 2015-07-29 MED ORDER — CEFEPIME HCL 1 G IJ SOLR: 1.0000 g | Freq: Three times a day (TID) | INTRAMUSCULAR | Status: DC

## 2015-07-29 MED ORDER — BENZONATATE 100 MG PO CAPS
200.0000 mg | ORAL_CAPSULE | Freq: Three times a day (TID) | ORAL | Status: DC | PRN
  Administered 2015-08-01: 200 mg via ORAL
  Filled 2015-07-29: qty 2

## 2015-07-29 MED ORDER — SODIUM CHLORIDE 0.9 % IV SOLN: 100.0000 mL | INTRAVENOUS | Status: DC | PRN

## 2015-07-29 MED ORDER — VANCOMYCIN HCL IN DEXTROSE 750-5 MG/150ML-% IV SOLN
INTRAVENOUS | Status: AC
  Administered 2015-07-29: 750 mg via INTRAVENOUS
  Filled 2015-07-29: qty 150

## 2015-07-29 MED ORDER — ALBUTEROL SULFATE (2.5 MG/3ML) 0.083% IN NEBU
2.5000 mg | INHALATION_SOLUTION | RESPIRATORY_TRACT | Status: DC | PRN
  Administered 2015-07-29: 2.5 mg via RESPIRATORY_TRACT

## 2015-07-29 MED ORDER — PANTOPRAZOLE SODIUM 40 MG PO TBEC
40.0000 mg | DELAYED_RELEASE_TABLET | Freq: Every day | ORAL | Status: DC
  Administered 2015-07-29 – 2015-08-01 (×4): 40 mg via ORAL
  Filled 2015-07-29 (×5): qty 1

## 2015-07-29 MED ORDER — SEVELAMER CARBONATE 800 MG PO TABS
800.0000 mg | ORAL_TABLET | Freq: Three times a day (TID) | ORAL | Status: DC
  Administered 2015-07-29 – 2015-08-01 (×10): 800 mg via ORAL
  Filled 2015-07-29 (×14): qty 1

## 2015-07-29 MED ORDER — METHOCARBAMOL 500 MG PO TABS
500.0000 mg | ORAL_TABLET | Freq: Three times a day (TID) | ORAL | Status: DC | PRN
  Administered 2015-07-29 – 2015-08-02 (×3): 500 mg via ORAL
  Filled 2015-07-29 (×4): qty 1

## 2015-07-29 MED ORDER — TIOTROPIUM BROMIDE MONOHYDRATE 18 MCG IN CAPS
18.0000 ug | ORAL_CAPSULE | Freq: Every day | RESPIRATORY_TRACT | Status: DC
  Administered 2015-07-30 – 2015-08-02 (×3): 18 ug via RESPIRATORY_TRACT
  Filled 2015-07-29: qty 5

## 2015-07-29 MED ORDER — METOPROLOL TARTRATE 25 MG PO TABS
25.0000 mg | ORAL_TABLET | Freq: Two times a day (BID) | ORAL | Status: DC
  Administered 2015-07-29 – 2015-08-02 (×7): 25 mg via ORAL
  Filled 2015-07-29 (×11): qty 1

## 2015-07-29 MED ORDER — ALBUTEROL SULFATE (2.5 MG/3ML) 0.083% IN NEBU
2.5000 mg | INHALATION_SOLUTION | Freq: Three times a day (TID) | RESPIRATORY_TRACT | Status: DC
  Administered 2015-07-30 – 2015-08-02 (×9): 2.5 mg via RESPIRATORY_TRACT
  Filled 2015-07-29 (×11): qty 3

## 2015-07-29 MED ORDER — LIDOCAINE HCL (PF) 1 % IJ SOLN: 5.0000 mL | INTRAMUSCULAR | Status: DC | PRN

## 2015-07-29 MED ORDER — ALBUTEROL SULFATE HFA 108 (90 BASE) MCG/ACT IN AERS: 2.0000 | INHALATION_SPRAY | RESPIRATORY_TRACT | Status: DC | PRN

## 2015-07-29 MED ORDER — HEPARIN SODIUM (PORCINE) 1000 UNIT/ML DIALYSIS: 4000.0000 [IU] | INTRAMUSCULAR | Status: DC | PRN

## 2015-07-29 MED ORDER — DEXTROSE 5 % IV SOLN
2.0000 g | INTRAVENOUS | Status: AC
  Administered 2015-07-29 – 2015-07-31 (×3): 2 g via INTRAVENOUS
  Filled 2015-07-29 (×4): qty 2

## 2015-07-29 MED ORDER — BUDESONIDE-FORMOTEROL FUMARATE 160-4.5 MCG/ACT IN AERO
2.0000 | INHALATION_SPRAY | Freq: Two times a day (BID) | RESPIRATORY_TRACT | Status: DC
  Administered 2015-07-29 – 2015-08-02 (×7): 2 via RESPIRATORY_TRACT
  Filled 2015-07-29: qty 6

## 2015-07-29 MED ORDER — HEPARIN SODIUM (PORCINE) 1000 UNIT/ML DIALYSIS
1000.0000 [IU] | INTRAMUSCULAR | Status: DC | PRN
  Filled 2015-07-29: qty 1

## 2015-07-29 MED ORDER — MUPIROCIN 2 % EX OINT
TOPICAL_OINTMENT | CUTANEOUS | Status: AC
  Administered 2015-07-29: 21:00:00
  Filled 2015-07-29: qty 22

## 2015-07-29 MED ORDER — POLYETHYLENE GLYCOL 3350 17 G PO PACK
17.0000 g | PACK | Freq: Every day | ORAL | Status: DC | PRN
  Filled 2015-07-29 (×2): qty 1

## 2015-07-29 NOTE — Progress Notes (Signed)
PROGRESS NOTE  [name] [id number] DOB: 1935-03-16 DOA: 07/28/2015 PCP: [name], DO  Assessment/Plan: Acute respiratory failure with hypoxia probably secondary to pneumonia with a contribution of fluid overload - -vancomycin and cefepime for healthcare associated pneumonia since patient was recently in the hospital.  ESRD on hemodialysis on Monday Wednesday and Friday  -consult nephrology for dialysis  Bilateral lower extremity edema - we'll check Dopplers to rule out DVT.  CAD status post stenting - denies any chest pain but point of care troponin is mildly elevated. Cycle cardiac markers.  Chronic A. fib rate controlled - not on anticoagulants secondary to recent GI bleed.  Chronic anemia with recent GI bleed - follow CBC.  COPD -nebs   Code Status: full Family Communication: wife at bedside Disposition Plan:    Consultants:  renal  Procedures:      HPI/Subjective: Still with cough Asking for breakfast  Objective: Filed Vitals:   07/29/15 0600 07/29/15 0715  BP: 149/62 130/67  Pulse: 90 93  Resp: 19 20   No intake or output data in the 24 hours ending 07/29/15 0745 There were no vitals filed for this visit.  Exam:   General:  A+Ox3, NAD  Cardiovascular: irr, but rate controlled  Respiratory: coarse breath sounds  Abdomen: +BS, soft  Musculoskeletal: min edema   Data Reviewed: Basic Metabolic Panel:  Recent Labs Lab 07/28/15 2135 07/29/15 0600  NA 130* 130*  K 4.9 4.5  CL 90* 92*  CO2 27 23  GLUCOSE 157* 206*  BUN 57* 61*  CREATININE 3.73* 4.01*  CALCIUM 8.7* 8.4*   Liver Function Tests:  Recent Labs Lab 07/29/15 0600  AST 22  ALT 19  ALKPHOS 60  BILITOT 0.8  PROT 6.2*  ALBUMIN 2.9*   No results for input(s): LIPASE, AMYLASE in the last 168 hours. No results for input(s): AMMONIA in the last 168 hours. CBC:  Recent Labs Lab 07/28/15 2135 07/29/15 0600  WBC 16.1* 14.2*  NEUTROABS  --  13.5*  HGB  12.2* 12.0*  HCT 37.9* 37.8*  MCV 93.8 94.3  PLT 265 228   Cardiac Enzymes:  Recent Labs Lab 07/28/15 2135  TROPONINI 0.14*   BNP (last 3 results)  Recent Labs  05/13/15 2017 06/08/15 0016 07/28/15 2135  BNP 362.1* 892.3* 432.6*    ProBNP (last 3 results) No results for input(s): PROBNP in the last 8760 hours.  CBG: No results for input(s): GLUCAP in the last 168 hours.  No results found for this or any previous visit (from the past 240 hour(s)).   Studies: Dg Chest Port 1 View  07/28/2015  CLINICAL DATA:  Acute onset of dyspnea.  Initial encounter. EXAM: PORTABLE CHEST 1 VIEW COMPARISON:  Chest radiograph performed 07/01/2015 FINDINGS: The lungs are well expanded. Right basilar airspace opacity raises concern for pneumonia. No pleural effusion or pneumothorax is seen. The cardiomediastinal silhouette is borderline normal in size. A right-sided dual-lumen catheter is noted ending about the distal SVC. No acute osseous abnormalities are seen. IMPRESSION: Right basilar airspace opacity is concerning for pneumonia. Electronically Signed   By: Garald Balding M.D.   On: 07/28/2015 21:47    Scheduled Meds: . albuterol  2.5 mg Nebulization Q4H  . amLODipine  10 mg Oral Daily  . atorvastatin  20 mg Oral q1800  . budesonide-formoterol  2 puff Inhalation BID  . feeding supplement (NEPRO CARB STEADY)  237 mL Oral BID AC  . heparin  5,000 Units Subcutaneous 3 times per  day  . metoprolol tartrate  25 mg Oral BID  . pantoprazole  40 mg Oral Daily  . sevelamer carbonate  800 mg Oral TID WC  . sodium chloride flush  3 mL Intravenous Q12H  . tiotropium  18 mcg Inhalation Daily  . vancomycin  750 mg Intravenous Q M,W,F-HD   Continuous Infusions: . ceFEPime (MAXIPIME) IV Stopped (07/28/15 2351)  . vancomycin 500 mg (07/29/15 0737)   Antibiotics Given (last 72 hours)    None      Principal Problem:   Acute respiratory failure with hypoxia (HCC) Active Problems:   COPD GOLD  III   Essential hypertension   End-stage renal disease on hemodialysis ([place])   Chronic diastolic heart failure ([place])   Acute respiratory failure ([place])    Time spent: 35 min    [facility] Pager [phone] If 7PM-7AM, please contact night-coverage at [email or web address], password [facility] 07/29/2015, 7:45 AM  LOS: 1 day

## 2015-07-29 NOTE — ED Notes (Signed)
Attempted to call report

## 2015-07-29 NOTE — Progress Notes (Signed)
Pharmacy Antibiotic Note  [name] is a 81 y.o. male admitted on 07/28/2015 with pneumonia.  Pharmacy has been consulted for Vancomycin dosing (already on Cefepime). WBC elevated. ESRD on HD MWF  Plan: -Give an additional 500 mg of vancomycin to complete 1500 mg load, then given 750 mg IV qHD MWF -Drug levels as indicated -F/U HD schedule    No data recorded.   Recent Labs Lab 07/28/15 2135 07/28/15 2156  WBC 16.1*  --   CREATININE 3.73*  --   LATICACIDVEN  --  2.13*    CrCl cannot be calculated (Unknown ideal weight.).    Allergies  Allergen Reactions  . Yellow Dyes (Non-Tartrazine) Other (See Comments)    Burns arms Cough medications (tussionex and benzonatate ok)  . Levaquin [Levofloxacin In D5w] Itching    Thank you for allowing pharmacy to be a part of this patient's care.  Jasai, Sandelin 07/29/2015 6:12 AM

## 2015-07-29 NOTE — Consult Note (Signed)
[facility] Renal Consultation Note    Indication for Consultation:  Management of ESRD/hemodialysis; anemia, hypertension/volume and secondary hyperparathyroidism PCP: [name], DO  HPI: Terry [name] is a 81 y.o. male with ESRD who has hemodialysis MWF at [facility]. Past medical history significant for hypertension, CVA, CAD,chronic diastolic HF, COPD, PNA, atrial fibrillation, secondary hyperparathyroidism, anemia of chronic disease. Patient had recent admission to [facility] 06/06/16-07/04/15 with Pneumonia due to Klebsiella pneumoniae and Enterobacter cloacae/ Acute respiratory failure/hypoxia with L. Tension pneumothorax requiring mechanical ventilation/chest tube insertion. He developed UGI bleed due to warfarin and received transfusion.  He has been at [facility] facility for rehabilitation.  Per wife and patient, he developed a productive cough approximately 10 days ago and was given oral antibiotics. He failed to respond and developed worsening SOB with productive cough and LE edema. He was brought to ED for evaluation.  Po2 was 55 PCO2 38 CXR on arrival showed Right basilar airspace opacity is concerning for pneumonia, WBC 16.1, Na 130, K+4.9, HGB 12.2 ,platelets 228. Initially he was placed on Bipap in ED for respiratory distress but has been weaned to nasal cannula. He has been started on vancomycin and cefepime per primary.  Patient is currently sitting on side of bed with wife at bedside. He denies SOB although his RR is 30, HR 107. He continues to have productive cough with beige sputum with bloody streaks. States he had SOB and cough prior to admission, weakness, no documented fevers, denies chills, nausea, vomiting, diarrhea, abdominal pain, flank pain, dizziness, vertigo, vision changes, tinnitus. He is scheduled to have HD this afternoon per schedule.   Patient attends HD at [facility]. He does not miss treatments. Does have issues with cramping for which he takes  clonazepam. Last in-center values as follows: Hgb-10.6 (07/24/15) Platelets 324 Ferritin 621 TSat 24% Ca 8.5 Phos 2.0 PTH 78 (07/17/15). He takes non-calcium binders (renvela) and low dose hectoral for PTH suppression. His last ESA dose was Mircera 225 mcg IV 07/24/15.    Past Medical History  Diagnosis Date  . Stroke ([place]) 2012  . High cholesterol   . Hypertension   . Kidney disease     CKD4, sees [facility], considering dialysis  . COPD (chronic obstructive pulmonary disease) ([place])   . CAD (coronary artery disease) [name]  . Acute and chronic respiratory failure 10/22/2012  . Osteoarthritis 10/22/2012  . Pneumonia April 2014  . Atrial fibrillation ([place])   . Shortness of breath dyspnea    Past Surgical History  Procedure Laterality Date  . Coronary stent placement  1999    2 stents  . Appendectomy  1984  . Colon surgery  2012    partial colon removed - twisted bowel  . Hernia repair      Right inguinal  . Cystoscopy/retrograde/ureteroscopy Bilateral 04/23/2014    Procedure: CYSTOSCOPY BILATERAL RETROGRADE,  LEFT URETEROSCOPY, LEFT STENT PLACEMENT;  Surgeon: Raynelle Bring, MD;  Location: [facility] ORS;  Service: Urology;  Laterality: Bilateral;  . Av fistula placement Right 05/21/2015    Procedure: Right Arm Brachiocephalic ARTERIOVENOUS (AV) FISTULA CREATION;  Surgeon: Angelia Mould, MD;  Location: [facility] OR;  Service: Vascular;  Laterality: Right;   Family History  Problem Relation Age of Onset  . Cancer Mother     Breast  . Heart disease Father   . Heart attack Father   . Parkinson's disease Brother    Social History:  reports that he quit smoking about 15  years ago. His smoking use included Cigarettes, Pipe, and Cigars. He has a 49 pack-year smoking history. He has never used smokeless tobacco. He reports that he does not drink alcohol or use illicit drugs. Allergies  Allergen Reactions  . Yellow Dyes (Non-Tartrazine) Other (See Comments)    Burns  arms Cough medications (tussionex and benzonatate ok)  . Levaquin [Levofloxacin In D5w] Itching   Prior to Admission medications   Medication Sig Start Date End Date Taking? Authorizing Provider  albuterol (VENTOLIN HFA) 108 (90 BASE) MCG/ACT inhaler Inhale 2 puffs into the lungs every 4 (four) hours as needed for wheezing or shortness of breath. 05/09/15  Yes [name], DO  amLODipine (NORVASC) 10 MG tablet Take 1 tablet (10 mg total) by mouth daily. 06/06/15  Yes Burnell Blanks, MD  amoxicillin-clavulanate (AUGMENTIN) 875-125 MG tablet Take 1 tablet by mouth 2 (two) times daily. 10 day course started 07/24/15 pm   Yes Historical Provider, MD  atorvastatin (LIPITOR) 20 MG tablet Take 1 tablet (20 mg total) by mouth daily. Patient taking differently: Take 20 mg by mouth daily at 6 PM.  03/25/15  Yes Burnell Blanks, MD  benzonatate (TESSALON) 200 MG capsule Take 1 capsule (200 mg total) by mouth 3 (three) times daily as needed for cough. 06/04/15  Yes Ripudeep Krystal Eaton, MD  budesonide-formoterol (SYMBICORT) 160-4.5 MCG/ACT inhaler Inhale 2 puffs into the lungs 2 (two) times daily. 11/07/14  Yes Tanda Rockers, MD  cholecalciferol (VITAMIN D) 1000 units tablet Take 1,000 Units by mouth daily.   Yes Historical Provider, MD  clonazePAM (KLONOPIN) 1 MG tablet Take 1 tablet (1 mg total) by mouth daily as needed for anxiety (for HD treatment). 06/04/15  Yes Ripudeep Krystal Eaton, MD  guaiFENesin-dextromethorphan (ROBITUSSIN DM) 100-10 MG/5ML syrup Take 5 mLs by mouth every 4 (four) hours as needed for cough. 06/04/15  Yes Ripudeep Krystal Eaton, MD  HYDROcodone-homatropine (HYCODAN) 5-1.5 MG/5ML syrup Take 5 mLs by mouth every 6 (six) hours as needed for cough.   Yes Historical Provider, MD  ipratropium-albuterol (DUONEB) 0.5-2.5 (3) MG/3ML SOLN Take 3 mLs by nebulization 3 (three) times daily. Patient taking differently: Take 3 mLs by nebulization 3 (three) times daily. 9am, 2pm, 9pm 06/04/15  Yes Ripudeep  Krystal Eaton, MD  methocarbamol (ROBAXIN) 500 MG tablet Take 1 tablet (500 mg total) by mouth every 8 (eight) hours as needed for muscle spasms. 06/04/15  Yes Ripudeep Krystal Eaton, MD  metoprolol tartrate (LOPRESSOR) 25 MG tablet Take 1 tablet (25 mg total) by mouth 2 (two) times daily. Patient taking differently: Take 25 mg by mouth 2 (two) times daily. 8am, 5pm 06/04/15  Yes Ripudeep Krystal Eaton, MD  Nutritional Supplements (NEPRO) LIQD Take 1 Can by mouth 2 (two) times daily before lunch and supper.   Yes Historical Provider, MD  pantoprazole (PROTONIX) 40 MG tablet Take 1 tablet (40 mg total) by mouth 2 (two) times daily before a meal. Patient taking differently: Take 40 mg by mouth daily.  07/04/15  Yes Debbe Odea, MD  polyethylene glycol (MIRALAX) packet Take 17 g by mouth daily as needed for moderate constipation. 06/04/15  Yes Ripudeep Krystal Eaton, MD  senna-docusate (SENOKOT S) 8.6-50 MG tablet Take 2 tablets by mouth at bedtime as needed for mild constipation. 06/04/15  Yes Ripudeep Krystal Eaton, MD  sevelamer carbonate (RENVELA) 800 MG tablet Take 1 tablet (800 mg total) by mouth 3 (three) times daily with meals. 07/04/15  Yes Debbe Odea, MD  tiotropium (SPIRIVA HANDIHALER) 18 MCG inhalation capsule INHALE THE CONTENTS OF ONE CAPSULE INTO LUNGS ONCE DAILY Patient taking differently: Place 18 mcg into inhaler and inhale daily.  04/25/15  Yes Tanda Rockers, MD   Current Facility-Administered Medications  Medication Dose Route Frequency Provider Last Rate Last Dose  . acetaminophen (TYLENOL) tablet 650 mg  650 mg Oral Q6H PRN Rise Patience, MD       Or  . acetaminophen (TYLENOL) suppository 650 mg  650 mg Rectal Q6H PRN Rise Patience, MD      . albuterol (PROVENTIL) (2.5 MG/3ML) 0.083% nebulizer solution 2.5 mg  2.5 mg Nebulization Q4H Rise Patience, MD   2.5 mg at 07/29/15 1218  . albuterol (PROVENTIL) (2.5 MG/3ML) 0.083% nebulizer solution 2.5 mg  2.5 mg Nebulization Q2H PRN Rise Patience, MD       . amLODipine (NORVASC) tablet 10 mg  10 mg Oral Daily Rise Patience, MD      . atorvastatin (LIPITOR) tablet 20 mg  20 mg Oral q1800 Rise Patience, MD      . benzonatate (TESSALON) capsule 200 mg  200 mg Oral TID PRN Rise Patience, MD      . budesonide-formoterol (SYMBICORT) 160-4.5 MCG/ACT inhaler 2 puff  2 puff Inhalation BID Rise Patience, MD   2 puff at 07/29/15 0729  . ceFEPIme (MAXIPIME) 1 g in dextrose 5 % 50 mL IVPB  1 g Intravenous Q24H Charlesetta Shanks, MD   Stopped at 07/28/15 2351  . clonazePAM (KLONOPIN) tablet 1 mg  1 mg Oral Daily PRN Rise Patience, MD      . feeding supplement (NEPRO CARB STEADY) liquid 237 mL  237 mL Oral BID AC Rise Patience, MD   237 mL at 07/29/15 1200  . guaiFENesin-dextromethorphan (ROBITUSSIN DM) 100-10 MG/5ML syrup 5 mL  5 mL Oral Q4H PRN Rise Patience, MD      . heparin injection 5,000 Units  5,000 Units Subcutaneous 3 times per day Rise Patience, MD      . methocarbamol (ROBAXIN) tablet 500 mg  500 mg Oral Q8H PRN Rise Patience, MD      . metoprolol tartrate (LOPRESSOR) tablet 25 mg  25 mg Oral BID Rise Patience, MD      . pantoprazole (PROTONIX) EC tablet 40 mg  40 mg Oral Daily Rise Patience, MD   40 mg at 07/29/15 1104  . polyethylene glycol (MIRALAX / GLYCOLAX) packet 17 g  17 g Oral Daily PRN Rise Patience, MD      . senna-docusate (Senokot-S) tablet 2 tablet  2 tablet Oral QHS PRN Rise Patience, MD      . sevelamer carbonate (RENVELA) tablet 800 mg  800 mg Oral TID WC Rise Patience, MD   800 mg at 07/29/15 1104  . sodium chloride flush (NS) 0.9 % injection 3 mL  3 mL Intravenous Q12H Rise Patience, MD      . tiotropium [facility]) inhalation capsule 18 mcg  18 mcg Inhalation Daily Rise Patience, MD   18 mcg at 07/29/15 0729  . vancomycin (VANCOCIN) IVPB 750 mg/150 ml premix  750 mg Intravenous Q M,W,F-HD Erenest Blank, [facility]       Labs: Basic Metabolic  Panel:  Recent Labs Lab 07/28/15 2135 07/29/15 0600  NA 130* 130*  K 4.9 4.5  CL 90* 92*  CO2 27 23  GLUCOSE 157* 206*  BUN  57* 61*  CREATININE 3.73* 4.01*  CALCIUM 8.7* 8.4*   Liver Function Tests:  Recent Labs Lab 07/29/15 0600  AST 22  ALT 19  ALKPHOS 60  BILITOT 0.8  PROT 6.2*  ALBUMIN 2.9*   No results for input(s): LIPASE, AMYLASE in the last 168 hours. No results for input(s): AMMONIA in the last 168 hours. CBC:  Recent Labs Lab 07/28/15 2135 07/29/15 0600  WBC 16.1* 14.2*  NEUTROABS  --  13.5*  HGB 12.2* 12.0*  HCT 37.9* 37.8*  MCV 93.8 94.3  PLT 265 228   Cardiac Enzymes:  Recent Labs Lab 07/28/15 2135 07/29/15 0841  TROPONINI 0.14* <0.03   CBG: No results for input(s): GLUCAP in the last 168 hours. Iron Studies: No results for input(s): IRON, TIBC, TRANSFERRIN, FERRITIN in the last 72 hours. Studies/Results: Dg Chest Port 1 View  07/28/2015  CLINICAL DATA:  Acute onset of dyspnea.  Initial encounter. EXAM: PORTABLE CHEST 1 VIEW COMPARISON:  Chest radiograph performed 07/01/2015 FINDINGS: The lungs are well expanded. Right basilar airspace opacity raises concern for pneumonia. No pleural effusion or pneumothorax is seen. The cardiomediastinal silhouette is borderline normal in size. A right-sided dual-lumen catheter is noted ending about the distal SVC. No acute osseous abnormalities are seen. IMPRESSION: Right basilar airspace opacity is concerning for pneumonia. Electronically Signed   By: Garald Balding M.D.   On: 07/28/2015 21:47    ROS: As per HPI otherwise negative.  Physical Exam: Filed Vitals:   07/29/15 0900 07/29/15 1100 07/29/15 1202 07/29/15 1218  BP: 131/50 141/54 136/54   Pulse: 97 106 97   Temp:   98.1 F (36.7 C)   TempSrc:   Oral   Resp: 18 19 18    Height:      Weight:      SpO2: 95% 93% 93% 95%     General: Chronically ill appearing male in NAD Head: Normocephalic, atraumatic, sclera non-icteric, mucus membranes are  moist Neck: Supple. JVD not elevated. Lungs: Bilateral breath sounds, decreased in bases R>L, coarse breath sounds upper lung fields, no wheezing. Tachypnea present without use of accessory muscles. Heart: Irregular, S1, S2, II/VI systolic M. AFIB on monitor with BBB. Rate 100-110.  Abdomen: Soft, non-tender, non-distended with normoactive bowel sounds. No rebound/guarding. No obvious abdominal masses. M-S:  Strength and tone appear normal for age. Lower extremities: 1+ bilateral LE edema from knees to ankles.  Neuro: Alert and oriented X 3. Moves all extremities spontaneously. Psych:  Responds to questions appropriately with a normal affect. Dialysis Access: RIJ tunneled HD catheter, drsg CDI. Maturing R brachiocephalic AVF placed 0000000 + bruit  Dialysis Orders: [facility] MonWedFri, 4 hrs 0 min, 180NRe Optiflux, BFR 400, DFR Manual 800 mL/min, EDW 77 (kg), Dialysate 2.0 K, 2.25 Ca,  UFR Profile: None, Sodium Model: None, Access:  RIJ Catheter-Tunneled Heparin: 8600 units per treatment q MWF Mircera 225 mcg IV Q 2 weeks (last dose 07/24/15) Venofer 50 mg IV weekly (last dose 07/24/15) Hectoral: 1 mcg IV q treatment MWF (last dose 07/26/15)   Assessment/Plan: 1.  Acute respiratory failure with hypoxia: Probable PNA-on vanc/maxipime per primary. Possible volume overload-will attempt to lower volume in HD today.  2.  ESRD -  MWF at [facility]. Will have HD today on schedule. K+ 4.5.  3.  Hypertension/volume  - BP controlled, on metoprolol. Na 130. Patient has LE edema. Will attempt UFG 3-7 liters today. Lower EDW.  4.  Anemia  - 12.0. Recent dose of ESA. Follow HGB.  5.  Metabolic bone disease - Ca  Last in-center phos 2.0. Hold binders for now, continue hectoral.  6.  Nutrition - Albumin 2.9. Renal diet with nepro/renal vit 7.  Afib: per primary. No anticoagulants due to UGIB.  8.  COPD GOLD III: per primary.   Rita H. Owens Shark, NP-C 07/29/2015, 2:09 PM  [employer]  [phone]  Pt seen, examined and agree w A/P as above.  Kelly Splinter MD [employer] pager [phone]    cell [phone] 07/29/2015, 3:58 PM

## 2015-07-29 NOTE — Progress Notes (Signed)
Utilization Review Completed.  

## 2015-07-29 NOTE — ED Notes (Signed)
Attempted to call report x2

## 2015-07-30 ENCOUNTER — Inpatient Hospital Stay (HOSPITAL_COMMUNITY): Payer: Medicare PPO

## 2015-07-30 DIAGNOSIS — R0602 Shortness of breath: Secondary | ICD-10-CM

## 2015-07-30 LAB — BASIC METABOLIC PANEL
ANION GAP: 14 (ref 5–15)
BUN: 30 mg/dL — AB (ref 6–20)
CHLORIDE: 97 mmol/L — AB (ref 101–111)
CO2: 24 mmol/L (ref 22–32)
Calcium: 8.4 mg/dL — ABNORMAL LOW (ref 8.9–10.3)
Creatinine, Ser: 2.42 mg/dL — ABNORMAL HIGH (ref 0.61–1.24)
GFR, EST AFRICAN AMERICAN: 28 mL/min — AB (ref >60)
GFR, EST NON AFRICAN AMERICAN: 24 mL/min — AB (ref >60)
Glucose, Bld: 227 mg/dL — ABNORMAL HIGH (ref 65–99)
POTASSIUM: 3.6 mmol/L (ref 3.5–5.1)
SODIUM: 135 mmol/L (ref 135–145)

## 2015-07-30 LAB — CBC
HCT: 34.1 % — ABNORMAL LOW (ref 39.0–52.0)
HEMOGLOBIN: 11 g/dL — AB (ref 13.0–17.0)
MCH: 30.1 pg (ref 26.0–34.0)
MCHC: 32.3 g/dL (ref 30.0–36.0)
MCV: 93.2 fL (ref 78.0–100.0)
Platelets: 206 10*3/uL (ref 150–400)
RBC: 3.66 MIL/uL — AB (ref 4.22–5.81)
RDW: 18.9 % — ABNORMAL HIGH (ref 11.5–15.5)
WBC: 15 10*3/uL — AB (ref 4.0–10.5)

## 2015-07-30 LAB — GLUCOSE, CAPILLARY
GLUCOSE-CAPILLARY: 129 mg/dL — AB (ref 65–99)
Glucose-Capillary: 154 mg/dL — ABNORMAL HIGH (ref 65–99)
Glucose-Capillary: 168 mg/dL — ABNORMAL HIGH (ref 65–99)

## 2015-07-30 MED ORDER — MUPIROCIN 2 % EX OINT
1.0000 "application " | TOPICAL_OINTMENT | Freq: Two times a day (BID) | CUTANEOUS | Status: DC
  Administered 2015-07-30 – 2015-08-01 (×5): 1 via NASAL
  Filled 2015-07-30 (×2): qty 22

## 2015-07-30 MED ORDER — METOPROLOL TARTRATE 25 MG PO TABS
25.0000 mg | ORAL_TABLET | Freq: Once | ORAL | Status: AC
  Administered 2015-07-30: 25 mg via ORAL

## 2015-07-30 MED ORDER — CHLORHEXIDINE GLUCONATE CLOTH 2 % EX PADS
6.0000 | MEDICATED_PAD | Freq: Every day | CUTANEOUS | Status: DC
  Administered 2015-07-30 – 2015-08-02 (×4): 6 via TOPICAL

## 2015-07-30 MED ORDER — INSULIN ASPART 100 UNIT/ML ~~LOC~~ SOLN
0.0000 [IU] | Freq: Three times a day (TID) | SUBCUTANEOUS | Status: DC
  Administered 2015-07-30 (×2): 2 [IU] via SUBCUTANEOUS
  Administered 2015-07-31: 3 [IU] via SUBCUTANEOUS
  Administered 2015-08-01: 2 [IU] via SUBCUTANEOUS

## 2015-07-30 NOTE — Evaluation (Signed)
Occupational Therapy Evaluation Patient Details Name: Terry [name] MRN: [id number] DOB: [birth year] Today's Date: 07/30/2015    History of Present Illness This 81 y.o. male admitted from [facility] due to sudden onset SOB and productive cough x 1 week.  Dx: ARF due to PNA and fluid overload.  PMH includes:  ESRD with HD M,W,F; CAD, chronic Afib, COPD   Clinical Impression   Pt admitted with above. He demonstrates the below listed deficits and will benefit from continued OT to maximize safety and independence with BADLs.   Pt presents to OT with deconditioning and generalized weakness.  He requires min A for ADLs, and fatigues quickly.  DOE 3/4 with minimal activity, sats decreased to 89% on 4L supplemental 02, but rebounded to low 90s quickly.  He requires frequent rest breaks.   His wife works, and he does not have assistance available while she is at work.  At this time, the recommendation is that he return to SNF for continued therapies.  Pt is agreeable.  Will follow.       Follow Up Recommendations  SNF    Equipment Recommendations  None recommended by OT    Recommendations for Other Services       Precautions / Restrictions Precautions Precautions: Fall      Mobility Bed Mobility Overal bed mobility: Needs Assistance Bed Mobility: Supine to Sit;Sit to Supine     Supine to sit: Supervision Sit to supine: Supervision      Transfers Overall transfer level: Needs assistance   Transfers: Sit to/from Stand;Stand Pivot Transfers Sit to Stand: Min guard Stand pivot transfers: Min guard       General transfer comment: min guard for safety     Balance Overall balance assessment: Needs assistance Sitting-balance support: Feet supported Sitting balance-Leahy Scale: Good     Standing balance support: Bilateral upper extremity supported Standing balance-Leahy Scale: Poor                              ADL Overall ADL's : Needs  assistance/impaired Eating/Feeding: Independent;Sitting   Grooming: Wash/dry hands;Wash/dry face;Oral care;Brushing hair;Set up;Sitting   Upper Body Bathing: Set up;Supervision/ safety;Sitting   Lower Body Bathing: Minimal assistance;Sit to/from stand   Upper Body Dressing : Set up;Supervision/safety;Sitting   Lower Body Dressing: Minimal assistance;Sit to/from stand   Toilet Transfer: Min guard;Stand-pivot;BSC;RW   Toileting- Water quality scientist and Hygiene: Min guard;Sit to/from stand       Functional mobility during ADLs: Min guard;Minimal assistance;Rolling walker General ADL Comments: Pt with DOE 3/4 with minimal activity.  Requires long and frequent rest breaks.       Vision     Perception     Praxis      Pertinent Vitals/Pain Pain Assessment: No/denies pain     Hand Dominance Right   Extremity/Trunk Assessment Upper Extremity Assessment Upper Extremity Assessment: Generalized weakness   Lower Extremity Assessment Lower Extremity Assessment: Defer to PT evaluation   Cervical / Trunk Assessment Cervical / Trunk Assessment: Normal   Communication Communication Communication: No difficulties   Cognition Arousal/Alertness: Awake/alert Behavior During Therapy:  (irritable ) Overall Cognitive Status: Within Functional Limits for tasks assessed                     General Comments       Exercises       Shoulder Instructions      Home Living Family/patient expects to be discharged  to:: Skilled nursing facility                                 Additional Comments: Pt lives with wife, but wife works and pt does not have needed support available at discharge       Prior Functioning/Environment Level of Independence: Needs assistance  Gait / Transfers Assistance Needed: Pt reports he was ambulating ~8ft with RW and therpist following with a chair  ADL's / Homemaking Assistance Needed: Pt reports he was getting bathed and  dressed with supervision - min guard assist, but fatigues         OT Diagnosis: Generalized weakness   OT Problem List: Decreased strength;Decreased activity tolerance;Impaired balance (sitting and/or standing);Decreased safety awareness;Decreased knowledge of use of DME or AE;Cardiopulmonary status limiting activity   OT Treatment/Interventions: Self-care/ADL training;Therapeutic exercise;DME and/or AE instruction;Therapeutic activities;Patient/family education;Balance training    OT Goals(Current goals can be found in the care plan section) Acute Rehab OT Goals Patient Stated Goal: "I don't want to regress" OT Goal Formulation: With patient Time For Goal Achievement: 08/13/15 Potential to Achieve Goals: Good ADL Goals Pt Will Perform Grooming: with min guard assist;standing Pt Will Perform Lower Body Bathing: with min guard assist;sit to/from stand Pt Will Perform Lower Body Dressing: with min guard assist;sit to/from stand Pt Will Transfer to Toilet: with min guard assist;ambulating;regular height toilet;bedside commode;grab bars Pt Will Perform Toileting - Clothing Manipulation and hygiene: with min guard assist;sit to/from stand Pt/caregiver will Perform Home Exercise Program: Increased strength;Right Upper extremity;Left upper extremity;With theraband;With Supervision Additional ADL Goal #1: Pt will actively participate in 15 mins therapeutic activity with no more than 1 rest break to increase endurance needed for ADLs   OT Frequency: Min 2X/week   Barriers to D/C: Decreased caregiver support          Co-evaluation              End of Session Equipment Utilized During Treatment: Oxygen;Rolling walker Nurse Communication: Mobility status  Activity Tolerance: Patient limited by fatigue Patient left: in bed;with call bell/phone within reach;with nursing/sitter in room   Time: [id number] OT Time Calculation (min): 12 min Charges:  OT General Charges $OT Visit: 1  Procedure OT Evaluation $OT Eval Moderate Complexity: 1 Procedure G-Codes:    Terry [name] M 18-Aug-2015, 4:27 PM

## 2015-07-30 NOTE — Progress Notes (Signed)
[facility] PROGRESS NOTE   Terry [name] [id number] DOB: [birth year] DOA: 07/28/2015 PCP: Colin Benton R., DO  HPI/Subjective: Feels better, denies any shortness of breath. Still has some cough  Assessment/Plan: Principal Problem:   Acute respiratory failure with hypoxia (HCC) Active Problems:   COPD GOLD III   Essential hypertension   End-stage renal disease on hemodialysis (HCC)   Chronic diastolic heart failure (HCC)   Acute respiratory failure (HCC)    Acute respiratory failure with hypoxia Patient presented with labored breathing, respiratory rate was 31, ABG showed PCO2 of 55 mmHg on room air. This is likely a combination of pneumonia and fluid overload. This is improving currently only on 2 L of oxygen via nasal cannula.  Healthcare associated pneumonia Started on vancomycin and cefepime, was recently for pneumonia. Currently has right basilar airspace disease suggesting pneumonia, continue antibiotics. Continue supportive therapy with bronchodilators, mucolytics, antitussives and oxygen.  ESRD Nephrology consulted, patient has lower extremity edema likely secondary to fluid overload. Lower extremity Doppler was done, preliminary results showed no evidence of DVT. 3.25 L obtained on dialysis.  Chronic atrial fibrillation Rate is controlled, not on anticoagulation secondary to recent GI bleed.  Chronic anemia Patient has chronic anemia secondary to ESRD, has had recent GI bleed, follow CBC closely.  Diabetes mellitus type 2 Appears to be controlled, hemoglobin A1c from 04/23/2015 was 6.6. Insulin sliding scale, check hemoglobin A1c again.   Code Status: Full Code Family Communication: Plan discussed with the patient. Disposition Plan: Remains inpatient Diet: Diet renal with fluid restriction Fluid restriction:: 1200 mL Fluid; Room service appropriate?: Yes; Fluid consistency::  Thin  Consultants:  nephrology  Procedures:  None  Antibiotics:  Vanc and cefepime (indicate start date, and stop date if known)   Objective: Filed Vitals:   07/29/15 2354 07/30/15 0500  BP: 125/63 127/62  Pulse: 93 94  Temp: 97.8 F (36.6 C) 98.1 F (36.7 C)  Resp: 20 18    Intake/Output Summary (Last 24 hours) at 07/30/15 1052 Last data filed at 07/30/15 0900  Gross per 24 hour  Intake    870 ml  Output   3250 ml  Net  -2380 ml   Filed Weights   07/29/15 1540 07/29/15 2000 07/30/15 0500  Weight: 80.3 kg (177 lb 0.5 oz) 76.8 kg (169 lb 5 oz) 76.7 kg (169 lb 1.5 oz)    Exam: General: Alert and awake, oriented x3, not in any acute distress. HEENT: anicteric sclera, pupils reactive to light and accommodation, EOMI CVS: S1-S2 clear, no murmur rubs or gallops Chest: clear to auscultation bilaterally, no wheezing, rales or rhonchi Abdomen: soft nontender, nondistended, normal bowel sounds, no organomegaly Extremities: no cyanosis, clubbing or edema noted bilaterally Neuro: Cranial nerves II-XII intact, no focal neurological deficits  Data Reviewed: Basic Metabolic Panel:  Recent Labs Lab 07/28/15 2135 07/29/15 0600 07/30/15 0012  NA 130* 130* 135  K 4.9 4.5 3.6  CL 90* 92* 97*  CO2 27 23 24   GLUCOSE 157* 206* 227*  BUN 57* 61* 30*  CREATININE 3.73* 4.01* 2.42*  CALCIUM 8.7* 8.4* 8.4*   Liver Function Tests:  Recent Labs Lab 07/29/15 0600  AST 22  ALT 19  ALKPHOS 60  BILITOT 0.8  PROT 6.2*  ALBUMIN 2.9*   No results for input(s): LIPASE, AMYLASE in the last 168 hours. No results for input(s): AMMONIA in the last 168 hours. CBC:  Recent Labs Lab 07/28/15 2135 07/29/15 0600 07/30/15 0012  WBC 16.1* 14.2* 15.0*  NEUTROABS  --  13.5*  --   HGB 12.2* 12.0* 11.0*  HCT 37.9* 37.8* 34.1*  MCV 93.8 94.3 93.2  PLT 265 228 206   Cardiac Enzymes:  Recent Labs Lab 07/28/15 2135 07/29/15 0841 07/29/15 2055  TROPONINI 0.14* <0.03 <0.03    BNP (last 3 results)  Recent Labs  05/13/15 2017 06/08/15 0016 07/28/15 2135  BNP 362.1* 892.3* 432.6*    ProBNP (last 3 results) No results for input(s): PROBNP in the last 8760 hours.  CBG: No results for input(s): GLUCAP in the last 168 hours.  Micro Recent Results (from the past 240 hour(s))  Culture, blood (routine x 2)     Status: None (Preliminary result)   Collection Time: 07/28/15  9:35 PM  Result Value Ref Range Status   Specimen Description BLOOD LEFT ARM  Final   Special Requests BOTTLES DRAWN AEROBIC AND ANAEROBIC 10CC  Final   Culture NO GROWTH < 24 HOURS  Final   Report Status PENDING  Incomplete  MRSA PCR Screening     Status: Abnormal   Collection Time: 07/29/15  8:40 AM  Result Value Ref Range Status   MRSA by PCR POSITIVE (A) NEGATIVE Final    Comment:        The GeneXpert MRSA Assay (FDA approved for NASAL specimens only), is one component of a comprehensive MRSA colonization surveillance program. It is not intended to diagnose MRSA infection nor to guide or monitor treatment for MRSA infections. RESULT CALLED TO, READ BACK BY AND VERIFIED WITH: AGUIRRE RN 10:15 07/29/15 ([email or web address])   Culture, sputum-assessment     Status: None   Collection Time: 07/29/15  8:56 AM  Result Value Ref Range Status   Specimen Description SPUTUM  Final   Special Requests NONE  Final   Sputum evaluation   Final    MICROSCOPIC FINDINGS SUGGEST THAT THIS SPECIMEN IS NOT REPRESENTATIVE OF LOWER RESPIRATORY SECRETIONS. PLEASE RECOLLECT. RESULT CALLED TO, READ BACK BY AND VERIFIED WITH: Netta Corrigan RN 11:00 07/29/15 ([email or web address])    Report Status 07/29/2015 FINAL  Final     Studies: Dg Chest Port 1 View  07/28/2015  CLINICAL DATA:  Acute onset of dyspnea.  Initial encounter. EXAM: PORTABLE CHEST 1 VIEW COMPARISON:  Chest radiograph performed 07/01/2015 FINDINGS: The lungs are well expanded. Right basilar airspace opacity raises concern for pneumonia. No pleural effusion or  pneumothorax is seen. The cardiomediastinal silhouette is borderline normal in size. A right-sided dual-lumen catheter is noted ending about the distal SVC. No acute osseous abnormalities are seen. IMPRESSION: Right basilar airspace opacity is concerning for pneumonia. Electronically Signed   By: Garald Balding M.D.   On: 07/28/2015 21:47    Scheduled Meds: . albuterol  2.5 mg Nebulization TID  . amLODipine  10 mg Oral Daily  . atorvastatin  20 mg Oral q1800  . budesonide-formoterol  2 puff Inhalation BID  . ceFEPime (MAXIPIME) IV  2 g Intravenous Q M,W,F-2000  . Chlorhexidine Gluconate Cloth  6 each Topical Q0600  . feeding supplement (NEPRO CARB STEADY)  237 mL Oral BID AC  . heparin  5,000 Units Subcutaneous 3 times per day  . metoprolol tartrate  25 mg Oral BID  . mupirocin ointment  1 application Nasal BID  . pantoprazole  40 mg Oral Daily  . sevelamer carbonate  800 mg Oral TID WC  . sodium chloride flush  3 mL Intravenous Q12H  . tiotropium  18 mcg Inhalation Daily  . vancomycin  750 mg Intravenous  Q M,W,F-HD   Continuous Infusions:      Time spent: 35 minutes    [facility] A  [facility] Pager [phone] If 7PM-7AM, please contact night-coverage at [email or web address], password [facility] 07/30/2015, 10:52 AM  LOS: 2 days

## 2015-07-30 NOTE — Progress Notes (Signed)
Pt. Report given to [facility] on [place] and transferred by bed.

## 2015-07-30 NOTE — Progress Notes (Signed)
CSW spoke with pt at bedside concerning plan for time of DC.  Pt confirmed that he was staying at [facility] for short term rehab but states that he now plans to return home after making a lot of improvement at SNF.  Pt is agreeable to go back to SNF if needed but feels like he can manage at home- will await PT eval to determine disposition.  CSW signing off for now- RNCM aware of pt preference for home- CSW will get back involved if PT continues to recommend SNF  Domenica Reamer, [place] Worker [phone]

## 2015-07-30 NOTE — Progress Notes (Signed)
*  PRELIMINARY RESULTS* Vascular Ultrasound Lower extremity venous duplex has been completed.  Preliminary findings: No evidence of DVT or baker's cyst.   Landry Mellow, RDMS, RVT  07/30/2015, 9:37 AM

## 2015-07-30 NOTE — Progress Notes (Signed)
  [facility] Progress Note   Subjective: no complaint, breathing better. 3.5 kg off w hd yest  Filed Vitals:   07/29/15 2327 07/29/15 2354 07/30/15 0500 07/30/15 0756  BP:  125/63 127/62   Pulse: 90 93 94   Temp:  97.8 F (36.6 C) 98.1 F (36.7 C)   TempSrc:  Oral Oral   Resp: 18 20 18    Height:      Weight:   76.7 kg (169 lb 1.5 oz)   SpO2: 94% 94% 93% 94%    Inpatient medications: . albuterol  2.5 mg Nebulization TID  . amLODipine  10 mg Oral Daily  . atorvastatin  20 mg Oral q1800  . budesonide-formoterol  2 puff Inhalation BID  . ceFEPime (MAXIPIME) IV  2 g Intravenous Q M,W,F-2000  . Chlorhexidine Gluconate Cloth  6 each Topical Q0600  . feeding supplement (NEPRO CARB STEADY)  237 mL Oral BID AC  . heparin  5,000 Units Subcutaneous 3 times per day  . insulin aspart  0-9 Units Subcutaneous TID WC  . metoprolol tartrate  25 mg Oral BID  . mupirocin ointment  1 application Nasal BID  . pantoprazole  40 mg Oral Daily  . sevelamer carbonate  800 mg Oral TID WC  . sodium chloride flush  3 mL Intravenous Q12H  . tiotropium  18 mcg Inhalation Daily  . vancomycin  750 mg Intravenous Q M,W,F-HD     acetaminophen **OR** acetaminophen, albuterol, benzonatate, clonazePAM, guaiFENesin-dextromethorphan, methocarbamol, polyethylene glycol, senna-docusate  Exam: Chron ill appearing, no distress, coughing +JVD Chest bilat basilar coarse rales Irreg irreg 2/6 sem Abd soft ntnd no ascites Bilat 1+pitting LE edema RUA AVF+bruit, R IJ cath Neuro alert nf  Dialysis: MWF [facility]  4h  77kg   2/ 2.25 bath  R IJ cath/ maturing R avf  Hep 8600 Mircera 225 q 2, last 2/1 Venofer 50/wk Hect 1ug tiw      Assessment: 1 SOB/ hypoxemia - CXR normal w/o edema, small PNA possibly. May need home O2 due to COPD.  2 ESRD HD MWF 3 Afib no AC d/t hx GIB 4 Anemia of CKD on esa at center 5 COPD  6 MBD cont D, hold binder low P 7 HTN cont MTP 8 Vol at dry weight today. Plan HD  tomorrow, try to lower if BP tolerates   Plan - HD Gretta Arab MD [facility] pager [phone]    cell [phone] 07/30/2015, 11:43 AM    Recent Labs Lab 07/28/15 2135 07/29/15 0600 07/30/15 0012  NA 130* 130* 135  K 4.9 4.5 3.6  CL 90* 92* 97*  CO2 27 23 24   GLUCOSE 157* 206* 227*  BUN 57* 61* 30*  CREATININE 3.73* 4.01* 2.42*  CALCIUM 8.7* 8.4* 8.4*    Recent Labs Lab 07/29/15 0600  AST 22  ALT 19  ALKPHOS 60  BILITOT 0.8  PROT 6.2*  ALBUMIN 2.9*    Recent Labs Lab 07/28/15 2135 07/29/15 0600 07/30/15 0012  WBC 16.1* 14.2* 15.0*  NEUTROABS  --  13.5*  --   HGB 12.2* 12.0* 11.0*  HCT 37.9* 37.8* 34.1*  MCV 93.8 94.3 93.2  PLT 265 228 206

## 2015-07-30 NOTE — Plan of Care (Signed)
Problem: Activity: Goal: Ability to tolerate increased activity will improve Outcome: Progressing Pt able to pull himself up in bed, easily move to sit at the edge of the bed get back into bed with ease.

## 2015-07-30 NOTE — Care Management Note (Signed)
Case Management Note  Patient Details  Name: Terry [name]. MRN: [id number] Date of Birth: 1934/11/21  Subjective/Objective:   Pt was transferred to [facility] SNF for rehab after previous hospitalization.  He now states he wants to go home with home health therapy vs going back to SNF because his copay was $150 per day.  Questioned whether his wife would be able to provide assistance if he discharged home, he responded that his wife works and he does not know if she can take FMLA.  Discussed private duty agencies, pt wants to wait until PT completes their evaluation - states he has been transferring to [facility] without difficulty.  PT eval is pending.                             Expected Discharge Plan:  [place]  Discharge planning Services  CM Consult  Status of Service:  In process, will continue to follow  Girard Cooter, RN 07/30/2015, 2:57 PM

## 2015-07-31 ENCOUNTER — Inpatient Hospital Stay (HOSPITAL_COMMUNITY): Payer: Medicare PPO

## 2015-07-31 DIAGNOSIS — Z992 Dependence on renal dialysis: Secondary | ICD-10-CM

## 2015-07-31 DIAGNOSIS — I5032 Chronic diastolic (congestive) heart failure: Secondary | ICD-10-CM

## 2015-07-31 DIAGNOSIS — J9601 Acute respiratory failure with hypoxia: Secondary | ICD-10-CM

## 2015-07-31 DIAGNOSIS — J181 Lobar pneumonia, unspecified organism: Secondary | ICD-10-CM

## 2015-07-31 DIAGNOSIS — J449 Chronic obstructive pulmonary disease, unspecified: Secondary | ICD-10-CM

## 2015-07-31 DIAGNOSIS — N186 End stage renal disease: Secondary | ICD-10-CM

## 2015-07-31 LAB — CBC WITH DIFFERENTIAL/PLATELET
BASOS PCT: 0 %
Basophils Absolute: 0 10*3/uL (ref 0.0–0.1)
EOS PCT: 1 %
Eosinophils Absolute: 0.1 10*3/uL (ref 0.0–0.7)
HEMATOCRIT: 34.4 % — AB (ref 39.0–52.0)
Hemoglobin: 10.6 g/dL — ABNORMAL LOW (ref 13.0–17.0)
Lymphocytes Relative: 11 %
Lymphs Abs: 1.8 10*3/uL (ref 0.7–4.0)
MCH: 28.7 pg (ref 26.0–34.0)
MCHC: 30.8 g/dL (ref 30.0–36.0)
MCV: 93.2 fL (ref 78.0–100.0)
MONO ABS: 1.4 10*3/uL — AB (ref 0.1–1.0)
MONOS PCT: 8 %
NEUTROS ABS: 13.5 10*3/uL — AB (ref 1.7–7.7)
Neutrophils Relative %: 80 %
PLATELETS: 213 10*3/uL (ref 150–400)
RBC: 3.69 MIL/uL — ABNORMAL LOW (ref 4.22–5.81)
RDW: 19.1 % — AB (ref 11.5–15.5)
WBC: 16.8 10*3/uL — ABNORMAL HIGH (ref 4.0–10.5)

## 2015-07-31 LAB — HEMOGLOBIN A1C
HEMOGLOBIN A1C: 5.3 % (ref 4.8–5.6)
Mean Plasma Glucose: 105 mg/dL

## 2015-07-31 LAB — GLUCOSE, CAPILLARY
GLUCOSE-CAPILLARY: 247 mg/dL — AB (ref 65–99)
GLUCOSE-CAPILLARY: 75 mg/dL (ref 65–99)
Glucose-Capillary: 96 mg/dL (ref 65–99)

## 2015-07-31 MED ORDER — HEPARIN SODIUM (PORCINE) 1000 UNIT/ML DIALYSIS: 4000.0000 [IU] | INTRAMUSCULAR | Status: DC | PRN

## 2015-07-31 MED ORDER — SODIUM CHLORIDE 0.9 % IV SOLN: 100.0000 mL | INTRAVENOUS | Status: DC | PRN

## 2015-07-31 MED ORDER — CLONAZEPAM 0.5 MG PO TABS
ORAL_TABLET | ORAL | Status: AC
  Filled 2015-07-31: qty 1

## 2015-07-31 MED ORDER — ALTEPLASE 2 MG IJ SOLR
2.0000 mg | Freq: Once | INTRAMUSCULAR | Status: DC | PRN
  Filled 2015-07-31: qty 2

## 2015-07-31 MED ORDER — LIDOCAINE-PRILOCAINE 2.5-2.5 % EX CREA
1.0000 "application " | TOPICAL_CREAM | CUTANEOUS | Status: DC | PRN
  Filled 2015-07-31: qty 5

## 2015-07-31 MED ORDER — PENTAFLUOROPROP-TETRAFLUOROETH EX AERO: 1.0000 "application " | INHALATION_SPRAY | CUTANEOUS | Status: DC | PRN

## 2015-07-31 MED ORDER — HEPARIN SODIUM (PORCINE) 1000 UNIT/ML DIALYSIS: 1000.0000 [IU] | INTRAMUSCULAR | Status: DC | PRN

## 2015-07-31 MED ORDER — LIDOCAINE HCL (PF) 1 % IJ SOLN: 5.0000 mL | INTRAMUSCULAR | Status: DC | PRN

## 2015-07-31 NOTE — Progress Notes (Addendum)
Late entry:  Earlier in shift, patient and wife shared concerns with RN regarding patient's dosage of lopressor.  They both stated that his home dosage of lopressor is 50 mg twice daily; however, he is only receiving 25 mg twice daily while in the hospital.  RN administered patient's scheduled evening dose of 25 mg.  Notified NP on-call regarding situation.  New orders received for pharmacy consult.  RN called pharmacy; tech stated that the pharmacist would see patient tonight.  Patient and family was notified and reassured but, after several hours of waiting, became upset.  RN called pharmacy again and was told that the pharmacist would not be able to see the patient until tomorrow.  Paged NP on-call, and orders were received to administer a one-time dose of lopressor 25 mg PO.  Patient and wife were made aware and were agreeable.  Lopressor administered.  Will continue to monitor.

## 2015-07-31 NOTE — Progress Notes (Signed)
[facility] Progress Note  Assessment/Plan: 1. Acute respiratory failure with hypoxia: Probable PNA-on vanc/maxipime per primary. BC neg Day 2. Possible volume overload. For HD today, cont lowering EDW as tolerated. O2 sats on 90% on 2L/M [place]. Tachypnea and tachycardia still present. No fevers past 24 hours. Has LE edema, not sure if pulm edema or not.  Continue daily HD for now , get vol down and set new dry weight. HD again tomorrow.  2. ESRD - MWF at [facility]. On HD today per schedule. K+ 3.6 change to 4.0 K bath.  3. Hypertension/volume - BP controlled, on metoprolol. Na ^ 135 Patient has persistent LE edema. UFG 3000 today. Will dialyze again tomorrow, attempt to lower volume with serial HD.  4. Anemia - 10.6. Recent dose of ESA. Follow HGB.  5. Metabolic bone disease - Ca Last in-center phos 2.0. Hold binders for now, continue hectoral.  6. Nutrition - Albumin 2.9. Renal diet with nepro/renal vit 7. Afib: per primary. No anticoagulants due to UGIB.  8. COPD GOLD III: per primary.  Rita H. Brown NP-C 07/31/2015, 10:25 AM  [facility] [phone]  Pt seen, examined, agree w assess/plan as above with additions as indicated.  Kelly Splinter MD [facility] pager [phone]    cell [phone] 07/31/2015, 11:58 AM     Subjective: "I feel better".  Patient in HD today, still has tachypnea, tachycardia, afebrile.   Objective Filed Vitals:   07/31/15 0800 07/31/15 0830 07/31/15 0900 07/31/15 0930  BP: 114/57 106/49 112/49 95/50  Pulse: 105 101 88 100  Temp:      TempSrc:      Resp:      Height:      Weight:      SpO2:       Physical Exam General: chronically ill appearing male, denies distress. Heart: Afib.Rate 110s. HS irregular II/VI systolic M.  Lungs: Bilateral breath sounds with scattered rhonchi. Productive cough with blood streaked sputum.  Abdomen: soft nontender Extremities: persistent bilateral LE edema.  Dialysis  Access: RIJ tunneled HD catheter, drsg CDI. Maturing R brachiocephalic AVF placed 0000000 + bruit  Dialysis Orders: [place] MonWedFri, 4 hrs 0 min, 180NRe Optiflux, BFR 400, DFR Manual 800 mL/min, EDW 77 (kg), Dialysate 2.0 K, 2.25 Ca, UFR Profile: None, Sodium Model: None, Access: RIJ Catheter-Tunneled Heparin: 8600 units per treatment q MWF Mircera 225 mcg IV Q 2 weeks (last dose 07/24/15) Venofer 50 mg IV weekly (last dose 07/24/15) Hectoral: 1 mcg IV q treatment MWF (last dose 07/26/15)   Additional Objective Labs: Basic Metabolic Panel:  Recent Labs Lab 07/28/15 2135 07/29/15 0600 07/30/15 0012  NA 130* 130* 135  K 4.9 4.5 3.6  CL 90* 92* 97*  CO2 27 23 24   GLUCOSE 157* 206* 227*  BUN 57* 61* 30*  CREATININE 3.73* 4.01* 2.42*  CALCIUM 8.7* 8.4* 8.4*   Liver Function Tests:  Recent Labs Lab 07/29/15 0600  AST 22  ALT 19  ALKPHOS 60  BILITOT 0.8  PROT 6.2*  ALBUMIN 2.9*   No results for input(s): LIPASE, AMYLASE in the last 168 hours. CBC:  Recent Labs Lab 07/28/15 2135 07/29/15 0600 07/30/15 0012 07/31/15 0736  WBC 16.1* 14.2* 15.0* 16.8*  NEUTROABS  --  13.5*  --  13.5*  HGB 12.2* 12.0* 11.0* 10.6*  HCT 37.9* 37.8* 34.1* 34.4*  MCV 93.8 94.3 93.2 93.2  PLT 265 228 206 213   Blood Culture    Component Value Date/Time  SDES SPUTUM 07/29/2015 0856   SPECREQUEST NONE 07/29/2015 0856   CULT NO GROWTH 1 DAY 07/28/2015 2219   REPTSTATUS 07/29/2015 FINAL 07/29/2015 0856    Cardiac Enzymes:  Recent Labs Lab 07/28/15 2135 07/29/15 0841 07/29/15 2055  TROPONINI 0.14* <0.03 <0.03   CBG:  Recent Labs Lab 07/30/15 1249 07/30/15 1746 07/30/15 2026  GLUCAP 154* 168* 129*   Iron Studies: No results for input(s): IRON, TIBC, TRANSFERRIN, FERRITIN in the last 72 hours. @lablastinr3 @ Studies/Results: No results found. Medications:   . albuterol  2.5 mg Nebulization TID  . amLODipine  10 mg Oral Daily  . atorvastatin  20 mg Oral q1800  .  budesonide-formoterol  2 puff Inhalation BID  . ceFEPime (MAXIPIME) IV  2 g Intravenous Q M,W,F-2000  . Chlorhexidine Gluconate Cloth  6 each Topical Q0600  . feeding supplement (NEPRO CARB STEADY)  237 mL Oral BID AC  . heparin  5,000 Units Subcutaneous 3 times per day  . insulin aspart  0-9 Units Subcutaneous TID WC  . metoprolol tartrate  25 mg Oral BID  . mupirocin ointment  1 application Nasal BID  . pantoprazole  40 mg Oral Daily  . sevelamer carbonate  800 mg Oral TID WC  . sodium chloride flush  3 mL Intravenous Q12H  . tiotropium  18 mcg Inhalation Daily  . vancomycin  750 mg Intravenous Q M,W,F-HD

## 2015-07-31 NOTE — Progress Notes (Signed)
Late entry:  Patient refusing bed alarm this evening shift.  Re-educated patient on importance of utilizing bed alarm; patient still refused.  Non-skid socks refused.  Bed in lowest position.  Emphasized use of call bell if needed to use the bathroom; verbalized understanding.  Will continue to monitor patient.

## 2015-07-31 NOTE — Progress Notes (Signed)
[facility] PROGRESS NOTE  Terry [name] [id number] DOB: Nov 18, 1934 DOA: 07/28/2015 PCP: [name], DO  Brief narrative 81 year old male with history of end-stage renal disease on hemodialysis (M, W, F ), CAD status post stenting, chronic A. fib who was admitted one month back for acute respiratory failure requiring intubation and had a complicated pneumothorax or guarding chest tube placement and GI bleed for which anticoagulation was discontinued at that time. Patient was discharged to [facility] skilled nursing facility. He was sent to the ED due to sudden onset of shortness of breath since 1 day. Patient was having productive cough off-and-on for past 1 week and reportedly was placed on oral antibiotics at the facility. In the ED he was found to be in acute hypoxic respiratory failure and was placed initially on BiPAP and was weaned off in the ED. Chest showed right basilar infiltrate and patient was admitted for acute hypoxic respiratory failure secondary to healthcare associated pneumonia and volume overload.  Assessment/Plan: Acute hypoxic respiratory failure Secondary to combination of right lower pneumonia and fluid overload. Improved with dialysis and maintaining O2 sat on productive drainage cannula. This morning he was tachypneic and satting 90% on 2 L during dialysis.[name] He still has significant volume overload and renal plan on further hemodialysis tomorrow. Completed full dialysis today. Continue empiric vancomycin and cefepime. Continue when necessary nebs. Continue antitussives. Continue Symbicort. Cultures negative. Will evaluate for home O2 needs prior to discharge.  COPD gold III  Continue home inhalers and when necessary nebulizer. Will evaluate for home O2 needed prior to discharge.  End-stage renal disease on dialysis Receiving scheduled hemodialysis. Has bilateral leg edema. Doppler negative for DVT. Had HD today but still volume overloaded. Plan on repeat  hemodialysis tomorrow. Renal following. Continue pectoral. Renal diet with nephro/renal vitamin.  A. fib  anticoagulation discontinued during recent hospitalization due to GI bleed. Continue beta blocker.  Anemia of chronic disease Hemoglobin stable. Recent ESA.  Hypertension Stable. Continue beta blocker.  Leukocytosis Given persistent shortness of breath I'll check a repeat 2 view chest x-ray.   Type 2 diabetes mellitus Stable. Continue sliding scale coverage.  DVT prophylaxis: Subcutaneous heparin  Diet: Renal.   Code Status: Full code Family Communication: None at bedside Disposition Plan: Continue inpatient monitoring. Patient does not want to go back to skilled nursing facility and wishes to go home upon discharge. Possibly in the next 48 hours if improved.   Consultants:  Nephrology  Procedures:  None  Antibiotics:  IV vancomycin and cefepime since 2/5  HPI/Subjective: Seen and examined while in dialysis. Patient descended 90% on 2 L and was tachypneic. Oxygen increased to 5 L via nasal cannula. Patient denies any chest pain symptoms.   Objective: Filed Vitals:   07/31/15 1030 07/31/15 1100  BP: 119/55 123/53  Pulse: 125 111  Temp:  98.2 F (36.8 C)  Resp:  20    Intake/Output Summary (Last 24 hours) at 07/31/15 1133 Last data filed at 07/31/15 1100  Gross per 24 hour  Intake    340 ml  Output   3200 ml  Net  -2860 ml   Filed Weights   07/30/15 2028 07/31/15 0721 07/31/15 1100  Weight: 76.5 kg (168 lb 10.4 oz) 78.5 kg (173 lb 1 oz) 74.9 kg (165 lb 2 oz)    Exam:   General:  Elderly male lying in bed appears fatigued and dyspneic  HEENT: No pallor, moist mucosa, no JVD  Chest: Bilateral breath sounds  CVS: Normal  S1 and S2, no murmurs or gallop  GI: Soft, nondistended, nontender, bowel sounds present  Musculoskeletal: 2+ pitting bilaterally  CNS: Alert and oriented  Data Reviewed: Basic Metabolic Panel:  Recent Labs Lab  07/28/15 2135 07/29/15 0600 07/30/15 0012  NA 130* 130* 135  K 4.9 4.5 3.6  CL 90* 92* 97*  CO2 27 23 24   GLUCOSE 157* 206* 227*  BUN 57* 61* 30*  CREATININE 3.73* 4.01* 2.42*  CALCIUM 8.7* 8.4* 8.4*   Liver Function Tests:  Recent Labs Lab 07/29/15 0600  AST 22  ALT 19  ALKPHOS 60  BILITOT 0.8  PROT 6.2*  ALBUMIN 2.9*   No results for input(s): LIPASE, AMYLASE in the last 168 hours. No results for input(s): AMMONIA in the last 168 hours. CBC:  Recent Labs Lab 07/28/15 2135 07/29/15 0600 07/30/15 0012 07/31/15 0736  WBC 16.1* 14.2* 15.0* 16.8*  NEUTROABS  --  13.5*  --  13.5*  HGB 12.2* 12.0* 11.0* 10.6*  HCT 37.9* 37.8* 34.1* 34.4*  MCV 93.8 94.3 93.2 93.2  PLT 265 228 206 213   Cardiac Enzymes:  Recent Labs Lab 07/28/15 2135 07/29/15 0841 07/29/15 2055  TROPONINI 0.14* <0.03 <0.03   BNP (last 3 results)  Recent Labs  05/13/15 2017 06/08/15 0016 07/28/15 2135  BNP 362.1* 892.3* 432.6*    ProBNP (last 3 results) No results for input(s): PROBNP in the last 8760 hours.  CBG:  Recent Labs Lab 07/30/15 1249 07/30/15 1746 07/30/15 2026  GLUCAP 154* 168* 129*    Recent Results (from the past 240 hour(s))  Culture, blood (routine x 2)     Status: None (Preliminary result)   Collection Time: 07/28/15  9:35 PM  Result Value Ref Range Status   Specimen Description BLOOD LEFT ARM  Final   Special Requests BOTTLES DRAWN AEROBIC AND ANAEROBIC 10CC  Final   Culture NO GROWTH 2 DAYS  Final   Report Status PENDING  Incomplete  Culture, blood (routine x 2)     Status: None (Preliminary result)   Collection Time: 07/28/15 10:19 PM  Result Value Ref Range Status   Specimen Description BLOOD LEFT HAND  Final   Special Requests BOTTLES DRAWN AEROBIC AND ANAEROBIC 5CC  Final   Culture NO GROWTH 1 DAY  Final   Report Status PENDING  Incomplete  MRSA PCR Screening     Status: Abnormal   Collection Time: 07/29/15  8:40 AM  Result Value Ref Range  Status   MRSA by PCR POSITIVE (A) NEGATIVE Final    Comment:        The GeneXpert MRSA Assay (FDA approved for NASAL specimens only), is one component of a comprehensive MRSA colonization surveillance program. It is not intended to diagnose MRSA infection nor to guide or monitor treatment for MRSA infections. RESULT CALLED TO, READ BACK BY AND VERIFIED WITH: AGUIRRE RN 10:15 07/29/15 ([email or web address])   Culture, sputum-assessment     Status: None   Collection Time: 07/29/15  8:56 AM  Result Value Ref Range Status   Specimen Description SPUTUM  Final   Special Requests NONE  Final   Sputum evaluation   Final    MICROSCOPIC FINDINGS SUGGEST THAT THIS SPECIMEN IS NOT REPRESENTATIVE OF LOWER RESPIRATORY SECRETIONS. PLEASE RECOLLECT. RESULT CALLED TO, READ BACK BY AND VERIFIED WITH: Netta Corrigan RN 11:00 07/29/15 ([email or web address])    Report Status 07/29/2015 FINAL  Final     Studies: No results found.  Scheduled Meds: . albuterol  2.5 mg Nebulization TID  .  amLODipine  10 mg Oral Daily  . atorvastatin  20 mg Oral q1800  . budesonide-formoterol  2 puff Inhalation BID  . ceFEPime (MAXIPIME) IV  2 g Intravenous Q M,W,F-2000  . Chlorhexidine Gluconate Cloth  6 each Topical Q0600  . feeding supplement (NEPRO CARB STEADY)  237 mL Oral BID AC  . heparin  5,000 Units Subcutaneous 3 times per day  . insulin aspart  0-9 Units Subcutaneous TID WC  . metoprolol tartrate  25 mg Oral BID  . mupirocin ointment  1 application Nasal BID  . pantoprazole  40 mg Oral Daily  . sevelamer carbonate  800 mg Oral TID WC  . sodium chloride flush  3 mL Intravenous Q12H  . tiotropium  18 mcg Inhalation Daily  . vancomycin  750 mg Intravenous Q M,W,F-HD   Continuous Infusions:     Time spent: 35 minutes    Rea Reser, [place]  [facility] Pager [phone]. If 7PM-7AM, please contact night-coverage at [email or web address], password [facility] 07/31/2015, 11:33 AM  LOS: 3 days

## 2015-07-31 NOTE — Progress Notes (Signed)
Patient requesting a muscle relaxant before hemodialysis this morning.  Notified NP on-call.  Awaiting orders.  Will continue to monitor.

## 2015-07-31 NOTE — Progress Notes (Signed)
Physical Therapy Cancellation Note  Patient currently off unit for dialysis. Will follow-up for evaluation.   07/31/15 0800  PT Visit Information  Last PT Received On 07/31/15  Reason Eval/Treat Not Completed Patient at procedure or test/unavailable     Elayne Snare, [place]

## 2015-07-31 NOTE — Progress Notes (Signed)
Patient refused bed alarm this shift. Patient sitting on the side of bed. RN educated patient about importance of having bed alarm on. Patient verbalized understanding. Call bed within reach. RN will continue to monitor patient.   Ermalinda Memos, RN

## 2015-07-31 NOTE — Evaluation (Signed)
Physical Therapy Evaluation Patient Details Name: Terry [name] MRN: [id number] DOB: Mar 20, 1935 Today's Date: 07/31/2015   History of Present Illness  This 81 y.o. male admitted from [facility] due to sudden onset SOB and productive cough x 1 week.  Dx: ARF due to PNA and fluid overload.  PMH includes:  ESRD with HD M,W,F; CAD, chronic Afib, COPD  Clinical Impression  Pt admitted with above diagnosis. Pt currently with functional limitations due to the deficits listed below (see PT Problem List). Easily fatigued, SpO2 down to 85% on 2L supplemental O2 with short distance ambulation (91% at rest.) Was ambulating up to 60 feet per pt at SNF prior to admission. Motivated to work with therapy although easily agitated. Pt will benefit from skilled PT to increase their independence and safety with mobility to allow discharge to the venue listed below.       Follow Up Recommendations SNF (Does not want to go to [facility])    Equipment Recommendations  None recommended by PT    Recommendations for Other Services       Precautions / Restrictions Precautions Precautions: Fall Restrictions Weight Bearing Restrictions: No      Mobility  Bed Mobility               General bed mobility comments: sitting EOB  Transfers Overall transfer level: Needs assistance Equipment used: Rolling walker (2 wheeled) Transfers: Sit to/from Stand Sit to Stand: Min guard         General transfer comment: Close guard for safety. VC for hand placement. Good power-up strength. Utilizes RW for support. Performed from bed and [facility]  Ambulation/Gait Ambulation/Gait assistance: Min assist Ambulation Distance (Feet): 15 Feet (x2) Assistive device: Rolling walker (2 wheeled) Gait Pattern/deviations: Step-through pattern;Decreased stride length;Trunk flexed Gait velocity: decreased Gait velocity interpretation: Below normal speed for age/gender General Gait Details: VC for walker placement and  upright posture no buckling noted. Assist and cues with O2 tubing. 3/4 dyspnea, 85% on 2L supplemental O2.  Stairs            Wheelchair Mobility    Modified Rankin (Stroke Patients Only)       Balance                                             Pertinent Vitals/Pain Pain Assessment: No/denies pain    Home Living Family/patient expects to be discharged to:: Skilled nursing facility Living Arrangements: Spouse/significant other               Additional Comments: Pt lives with wife, but wife works and pt does not have needed support available at discharge     Prior Function Level of Independence: Needs assistance   Gait / Transfers Assistance Needed: Pt reports he was ambulating ~93ft with RW and therpist following with a chair   ADL's / Homemaking Assistance Needed: Pt reports he was getting bathed and dressed with supervision - min guard assist, but fatigues         Hand Dominance   Dominant Hand: Right    Extremity/Trunk Assessment   Upper Extremity Assessment: Defer to OT evaluation           Lower Extremity Assessment: Generalized weakness      Cervical / Trunk Assessment: Normal  Communication   Communication: No difficulties  Cognition Arousal/Alertness: Awake/alert Behavior During Therapy: Agitated Overall Cognitive  Status: Within Functional Limits for tasks assessed                      General Comments General comments (skin integrity, edema, etc.): SpO2 91% on 2L supplemental O2 at rest, down to 85% with short distance ambulation up to 93% with 3L applied and seated rest break with cues for pursed lip breathing    Exercises        Assessment/Plan    PT Assessment Patient needs continued PT services  PT Diagnosis Difficulty walking;Abnormality of gait;Generalized weakness   PT Problem List Decreased strength;Decreased activity tolerance;Decreased balance;Decreased mobility;Decreased knowledge of use  of DME;Cardiopulmonary status limiting activity  PT Treatment Interventions DME instruction;Gait training;Functional mobility training;Therapeutic activities;Therapeutic exercise;Balance training;Patient/family education   PT Goals (Current goals can be found in the Care Plan section) Acute Rehab PT Goals Patient Stated Goal: I want more therapy PT Goal Formulation: With patient Time For Goal Achievement: 08/14/15 Potential to Achieve Goals: Fair    Frequency Min 2X/week   Barriers to discharge Decreased caregiver support wife limited assist    Co-evaluation               End of Session Equipment Utilized During Treatment: Gait belt;Oxygen Activity Tolerance: Patient limited by fatigue Patient left: in bed;with call bell/phone within reach;with family/visitor present;with bed alarm set           Time: 1550-1605 PT Time Calculation (min) (ACUTE ONLY): 15 min   Charges:   PT Evaluation $PT Eval Moderate Complexity: 1 Procedure     PT G CodesEllouise Newer 07/31/2015, 4:53 PM  Camille Bal Clifford, [place]

## 2015-08-01 DIAGNOSIS — J189 Pneumonia, unspecified organism: Secondary | ICD-10-CM

## 2015-08-01 LAB — CBC
HCT: 35.9 % — ABNORMAL LOW (ref 39.0–52.0)
HCT: 33.7 % — ABNORMAL LOW (ref 39.0–52.0)
Hemoglobin: 11.3 g/dL — ABNORMAL LOW (ref 13.0–17.0)
Hemoglobin: 10.3 g/dL — ABNORMAL LOW (ref 13.0–17.0)
MCH: 30 pg (ref 26.0–34.0)
MCH: 28.9 pg (ref 26.0–34.0)
MCHC: 31.5 g/dL (ref 30.0–36.0)
MCHC: 30.6 g/dL (ref 30.0–36.0)
MCV: 95.2 fL (ref 78.0–100.0)
MCV: 94.4 fL (ref 78.0–100.0)
PLATELETS: 173 10*3/uL (ref 150–400)
Platelets: 184 K/uL (ref 150–400)
RBC: 3.77 MIL/uL — AB (ref 4.22–5.81)
RBC: 3.57 MIL/uL — ABNORMAL LOW (ref 4.22–5.81)
RDW: 19.1 % — ABNORMAL HIGH (ref 11.5–15.5)
RDW: 18.8 % — ABNORMAL HIGH (ref 11.5–15.5)
WBC: 15 10*3/uL — AB (ref 4.0–10.5)
WBC: 13.8 10*3/uL — ABNORMAL HIGH (ref 4.0–10.5)

## 2015-08-01 LAB — GLUCOSE, CAPILLARY
GLUCOSE-CAPILLARY: 128 mg/dL — AB (ref 65–99)
GLUCOSE-CAPILLARY: 158 mg/dL — AB (ref 65–99)
GLUCOSE-CAPILLARY: 94 mg/dL (ref 65–99)

## 2015-08-01 LAB — RENAL FUNCTION PANEL
Albumin: 2.4 g/dL — ABNORMAL LOW (ref 3.5–5.0)
Anion gap: 9 (ref 5–15)
BUN: 32 mg/dL — ABNORMAL HIGH (ref 6–20)
CO2: 27 mmol/L (ref 22–32)
Calcium: 8.2 mg/dL — ABNORMAL LOW (ref 8.9–10.3)
Chloride: 97 mmol/L — ABNORMAL LOW (ref 101–111)
Creatinine, Ser: 2.78 mg/dL — ABNORMAL HIGH (ref 0.61–1.24)
GFR calc Af Amer: 24 mL/min — ABNORMAL LOW (ref >60)
GFR calc non Af Amer: 20 mL/min — ABNORMAL LOW (ref >60)
Glucose, Bld: 149 mg/dL — ABNORMAL HIGH (ref 65–99)
Phosphorus: 2.3 mg/dL — ABNORMAL LOW (ref 2.5–4.6)
Potassium: 3.7 mmol/L (ref 3.5–5.1)
Sodium: 133 mmol/L — ABNORMAL LOW (ref 135–145)

## 2015-08-01 MED ORDER — VANCOMYCIN HCL IN DEXTROSE 750-5 MG/150ML-% IV SOLN
750.0000 mg | INTRAVENOUS | Status: AC
  Administered 2015-08-01: 750 mg via INTRAVENOUS
  Filled 2015-08-01: qty 150

## 2015-08-01 MED ORDER — PENTAFLUOROPROP-TETRAFLUOROETH EX AERO: 1.0000 "application " | INHALATION_SPRAY | CUTANEOUS | Status: DC | PRN

## 2015-08-01 MED ORDER — SODIUM CHLORIDE 0.9 % IV SOLN: 100.0000 mL | INTRAVENOUS | Status: DC | PRN

## 2015-08-01 MED ORDER — HEPARIN SODIUM (PORCINE) 1000 UNIT/ML DIALYSIS: 3500.0000 [IU] | INTRAMUSCULAR | Status: DC | PRN

## 2015-08-01 MED ORDER — LIDOCAINE-PRILOCAINE 2.5-2.5 % EX CREA
1.0000 "application " | TOPICAL_CREAM | CUTANEOUS | Status: DC | PRN
  Filled 2015-08-01: qty 5

## 2015-08-01 MED ORDER — HEPARIN SODIUM (PORCINE) 1000 UNIT/ML DIALYSIS: 1000.0000 [IU] | INTRAMUSCULAR | Status: DC | PRN

## 2015-08-01 MED ORDER — LIDOCAINE-PRILOCAINE 2.5-2.5 % EX CREA: 1.0000 "application " | TOPICAL_CREAM | CUTANEOUS | Status: DC | PRN

## 2015-08-01 MED ORDER — SODIUM CHLORIDE 0.9 % IV SOLN
100.0000 mL | INTRAVENOUS | Status: DC | PRN
Start: 2015-08-01 — End: 2015-08-01

## 2015-08-01 MED ORDER — LIDOCAINE HCL (PF) 1 % IJ SOLN: 5.0000 mL | INTRAMUSCULAR | Status: DC | PRN

## 2015-08-01 MED ORDER — ALTEPLASE 2 MG IJ SOLR
2.0000 mg | Freq: Once | INTRAMUSCULAR | Status: DC | PRN
  Filled 2015-08-01: qty 2

## 2015-08-01 MED ORDER — CEFEPIME HCL 1 G IJ SOLR
1.0000 g | Freq: Once | INTRAMUSCULAR | Status: AC
  Administered 2015-08-01: 1 g via INTRAVENOUS
  Filled 2015-08-01: qty 1

## 2015-08-01 MED ORDER — ALTEPLASE 2 MG IJ SOLR: 2.0000 mg | Freq: Once | INTRAMUSCULAR | Status: DC | PRN

## 2015-08-01 NOTE — Progress Notes (Signed)
[facility] Progress Note  Assessment/Plan: 1. Acute respiratory failure with hypoxia: Probable PNA-on vanc/maxipime per primary. Repeat CXR shows right basilar infiltrate with small pleural effusion. Afebrile. BC neg Day 3. Does not appear volume overloaded per Xray but has persistent LE edema. Will have extra treatment today, then HD per schedule tomorrow to lower volume, reset EDW.  2. ESRD - MWF at [facility]. Extra treatment today.  3. Hypertension/volume - BP controlled, on metoprolol. Na ^ 135 Patient has persistent LE edema. attempt UFG 3-3.5 liters today.  4. Anemia - 11.3 today. Recent dose of ESA. Follow HGB.  5. Metabolic bone disease - Ca Last in-center phos 2.0. Hold binders for now, continue hectoral.  6. Nutrition - Albumin 2.9. Renal diet with nepro/renal vit 7. Afib: per primary. No anticoagulants due to UGIB.  8. COPD GOLD III: per primary. Remains on O2 via [place]. O2 Sats 93%.   Rita H. Brown NP-C 08/01/2015, 11:53 AM  [facility] [phone]  Pt seen, examined and agree w A/P as above. We are trying to see if getting more volume off with improve his SOB at all.  If not then this is all due to underlying COPD/ superimposed bronchitis, etc.  He is under dry wt already and will try for another 3 kg today.  Kelly Splinter MD [place] pager [phone]    cell [phone] 08/01/2015, 2:40 PM    Subjective: "Why do you keep asking how I'm doing?" Patient states breathing is no better but no worse. Up on bedside commode. DOE noted.     Objective Filed Vitals:   08/01/15 0430 08/01/15 0916 08/01/15 0931 08/01/15 0936  BP: 124/43  121/46   Pulse: 94  108   Temp: 98.2 F (36.8 C)  98.4 F (36.9 C)   TempSrc:   Oral   Resp: 21  22   Height:      Weight:      SpO2: 91% 95% 93% 93%   Physical Exam General: chronically ill appearing male, denies distress. Heart: Afib.Rate 90s-110. HS irregular II/VI systolic M.  Lungs:  Bilateral breath sounds, over all sounds diminished, few coarse breath sounds upper lung fields. DOE/mild WOB noted. Conts to have productive cough with blood streaked sputum.  Abdomen: soft nontender Extremities:bilateral LE edema. faint pulses.  Dialysis Access: RIJ tunneled HD catheter, drsg CDI. Maturing R brachiocephalic AVF placed 0000000 + bruit  Dialysis Orders: [facility] MonWedFri, 4 hrs 0 min, 180NRe Optiflux, BFR 400, DFR Manual 800 mL/min, EDW 77 (kg), Dialysate 2.0 K, 2.25 Ca, UFR Profile: None, Sodium Model: None, Access: RIJ Catheter-Tunneled Heparin: 8600 units per treatment q MWF Mircera 225 mcg IV Q 2 weeks (last dose 07/24/15) Venofer 50 mg IV weekly (last dose 07/24/15) Hectoral: 1 mcg IV q treatment MWF (last dose 07/26/15) Additional Objective Labs: Basic Metabolic Panel:  Recent Labs Lab 07/28/15 2135 07/29/15 0600 07/30/15 0012  NA 130* 130* 135  K 4.9 4.5 3.6  CL 90* 92* 97*  CO2 27 23 24   GLUCOSE 157* 206* 227*  BUN 57* 61* 30*  CREATININE 3.73* 4.01* 2.42*  CALCIUM 8.7* 8.4* 8.4*   Liver Function Tests:  Recent Labs Lab 07/29/15 0600  AST 22  ALT 19  ALKPHOS 60  BILITOT 0.8  PROT 6.2*  ALBUMIN 2.9*   No results for input(s): LIPASE, AMYLASE in the last 168 hours. CBC:  Recent Labs Lab 07/28/15 2135 07/29/15 0600 07/30/15 0012 07/31/15 0736 08/01/15 0817  WBC 16.1* 14.2* 15.0*  16.8* 15.0*  NEUTROABS  --  13.5*  --  13.5*  --   HGB 12.2* 12.0* 11.0* 10.6* 11.3*  HCT 37.9* 37.8* 34.1* 34.4* 35.9*  MCV 93.8 94.3 93.2 93.2 95.2  PLT 265 228 206 213 173   Blood Culture    Component Value Date/Time   SDES SPUTUM 07/29/2015 0856   SPECREQUEST NONE 07/29/2015 0856   CULT NO GROWTH 2 DAYS 07/28/2015 2219   REPTSTATUS 07/29/2015 FINAL 07/29/2015 0856    Cardiac Enzymes:  Recent Labs Lab 07/28/15 2135 07/29/15 0841 07/29/15 2055  TROPONINI 0.14* <0.03 <0.03   CBG:  Recent Labs Lab 07/30/15 2026 07/31/15 1206  07/31/15 1621 07/31/15 2043 08/01/15 0855  GLUCAP 129* 96 247* 75 128*   Iron Studies: No results for input(s): IRON, TIBC, TRANSFERRIN, FERRITIN in the last 72 hours. @lablastinr3 @ Studies/Results: Dg Chest 2 View  07/31/2015  CLINICAL DATA:  Shortness of breath and wheezing EXAM: CHEST  2 VIEW COMPARISON:  07/28/2015 FINDINGS: Cardiac shadow is stable. A dialysis catheter is again seen. Persistent right basilar infiltrate is seen. No new focal abnormality is noted. Small amount of pleural fluid is noted on the right as well. IMPRESSION: Persistent right basilar infiltrate with small pleural effusion. Electronically Signed   By: Inez Catalina M.D.   On: 07/31/2015 13:19   Medications:   . albuterol  2.5 mg Nebulization TID  . amLODipine  10 mg Oral Daily  . atorvastatin  20 mg Oral q1800  . budesonide-formoterol  2 puff Inhalation BID  . ceFEPime (MAXIPIME) IV  1 g Intravenous Once  . ceFEPime (MAXIPIME) IV  2 g Intravenous Q M,W,F-2000  . Chlorhexidine Gluconate Cloth  6 each Topical Q0600  . feeding supplement (NEPRO CARB STEADY)  237 mL Oral BID AC  . heparin  5,000 Units Subcutaneous 3 times per day  . insulin aspart  0-9 Units Subcutaneous TID WC  . metoprolol tartrate  25 mg Oral BID  . mupirocin ointment  1 application Nasal BID  . pantoprazole  40 mg Oral Daily  . sevelamer carbonate  800 mg Oral TID WC  . sodium chloride flush  3 mL Intravenous Q12H  . tiotropium  18 mcg Inhalation Daily  . vancomycin  750 mg Intravenous Q M,W,F-HD  . vancomycin  750 mg Intravenous Q Thu-HD

## 2015-08-01 NOTE — NC FL2 (Signed)
[place] LEVEL OF CARE SCREENING TOOL     IDENTIFICATION  Patient Name: Terry [name] Birthdate: 1935-06-07 Sex: male Admission Date (Current Location): 07/28/2015  [facility] and [place] Number:  Herbalist and Address:  [facility] [place]. [facility], [place] [address], [place],  [address]      Provider Number: [id number]  Attending Physician Name and Address:  Louellen Molder, MD  Relative Name and Phone Number:  Perl Klauser - spouse.  Phone number [phone]     Current Level of Care: Hospital Recommended Level of Care: [place] Prior Approval Number:    Date Approved/Denied:   PASRR Number: [id number] (Eff. 10/14/12)  Discharge Plan: SNF    Current Diagnoses: Patient Active Problem List   Diagnosis Date Noted  . ESRD (end stage renal disease) on dialysis ([place]) 07/28/2015  . Acute respiratory failure ([place]) 07/28/2015  . Chronic obstructive pulmonary disease ([place])   . HCAP (healthcare-associated pneumonia)   . Palliative care encounter   . Skin excoriation 06/29/2015  . History of ETT   . Acute blood loss anemia   . Acute GI bleeding   . Pneumothorax, left   . Chronic diastolic heart failure ([place])   . Anemia of chronic disease   . End-stage renal disease on hemodialysis ([place])   . Sepsis due to pneumonia ([place])   . Pneumonia due to Enterobacter cloacae ([place])   . Pneumonia due to Klebsiella pneumoniae ([place])   . Encounter for orogastric (OG) tube placement   . Encounter for intubation   . Chest tube in place   . Acute hypoxemic respiratory failure ([place]) 06/07/2015  . Chronic atrial fibrillation ([place])   . ESRD (end stage renal disease) ([place])   . Acute respiratory failure with hypoxia ([place]) 05/15/2015  . Acute renal failure superimposed on stage 4 chronic kidney disease ([place]) 05/15/2015  . Acute diastolic CHF (congestive heart failure) ([place]) 05/15/2015  . Acute on chronic kidney failure ([place]) 05/14/2015  . CAD  (coronary artery disease) 02/04/2015  . COPD exacerbation ([place]) 05/16/2014  . Hyperkalemia 04/13/2014  . COPD GOLD III 10/22/2012  . CKD (chronic kidney disease) stage 4, GFR 15-29 ml/min (HCC) 10/22/2012  . CAD (coronary artery disease) of artery bypass graft 10/22/2012  . Other and unspecified hyperlipidemia 10/22/2012  . Osteoarthritis 10/22/2012  . Essential hypertension 10/22/2012    Orientation RESPIRATION BLADDER Height & Weight     Self, Time, Situation, Place  O2 (2 Liters oxygen) Continent Weight: 171 lb 1.2 oz (77.6 kg) Height:  5\' 8"  (172.7 cm)  BEHAVIORAL SYMPTOMS/MOOD NEUROLOGICAL BOWEL NUTRITION STATUS      Continent Diet (Renal with 1200 mL fluide restriction)  AMBULATORY STATUS COMMUNICATION OF NEEDS Skin   Limited Assist Verbally Normal, Other (Comment) (Ecchymosis right/left arm)                       Personal Care Assistance Level of Assistance  Bathing, Feeding, Dressing Bathing Assistance: Limited assistance Feeding assistance: Independent Dressing Assistance: Limited assistance     Functional Limitations Info  Sight, Hearing, Speech Sight Info: Adequate Hearing Info: Adequate Speech Info: Adequate    SPECIAL CARE FACTORS FREQUENCY  PT (By licensed PT), OT (By licensed OT)     PT Frequency: Evaluated 07/31/15 - Minimum of 2X per week therapy recommended OT Frequency: Evaluated 07/30/15 - Minimum os 2X per week therapy recommended            Contractures Contractures  Info: Not present    Additional Factors Info  Code Status, Allergies, Insulin Sliding Scale Code Status Info: Full code Allergies Info: Yellow Dyes, Levaquin   Insulin Sliding Scale Info: 3 times daily sliding scale       Current Medications (08/01/2015):  This is the current hospital active medication list Current Facility-Administered Medications  Medication Dose Route Frequency Provider Last Rate Last Dose  . 0.9 %  sodium chloride infusion  100 mL Intravenous PRN Roney Jaffe, MD      . 0.9 %  sodium chloride infusion  100 mL Intravenous PRN Valentina Gu, NP      . acetaminophen (TYLENOL) tablet 650 mg  650 mg Oral Q6H PRN Rise Patience, MD       Or  . acetaminophen (TYLENOL) suppository 650 mg  650 mg Rectal Q6H PRN Rise Patience, MD      . albuterol (PROVENTIL) (2.5 MG/3ML) 0.083% nebulizer solution 2.5 mg  2.5 mg Nebulization Q2H PRN Rise Patience, MD   2.5 mg at 07/29/15 2324  . albuterol (PROVENTIL) (2.5 MG/3ML) 0.083% nebulizer solution 2.5 mg  2.5 mg Nebulization TID Geradine Girt, DO   2.5 mg at 08/01/15 0916  . alteplase (CATHFLO ACTIVASE) injection 2 mg  2 mg Intracatheter Once PRN Roney Jaffe, MD      . amLODipine (NORVASC) tablet 10 mg  10 mg Oral Daily Rise Patience, MD   10 mg at 07/31/15 1135  . atorvastatin (LIPITOR) tablet 20 mg  20 mg Oral q1800 Rise Patience, MD   20 mg at 07/31/15 1741  . benzonatate (TESSALON) capsule 200 mg  200 mg Oral TID PRN Rise Patience, MD   200 mg at 08/01/15 1229  . budesonide-formoterol (SYMBICORT) 160-4.5 MCG/ACT inhaler 2 puff  2 puff Inhalation BID Rise Patience, MD   2 puff at 08/01/15 [phone]  . ceFEPIme (MAXIPIME) 1 g in dextrose 5 % 50 mL IVPB  1 g Intravenous Once Rolla Flatten, RPH      . ceFEPIme (MAXIPIME) 2 g in dextrose 5 % 50 mL IVPB  2 g Intravenous Q M,W,F-2000 Eudelia Bunch, RPH   2 g at 07/31/15 2002  . Chlorhexidine Gluconate Cloth 2 % PADS 6 each  6 each Topical Q0600 Verlee Monte, MD   6 each at 08/01/15 0524  . clonazePAM (KLONOPIN) tablet 1 mg  1 mg Oral Daily PRN Rise Patience, MD   1 mg at 08/01/15 1229  . feeding supplement (NEPRO CARB STEADY) liquid 237 mL  237 mL Oral BID AC Rise Patience, MD   237 mL at 07/31/15 1700  . guaiFENesin-dextromethorphan (ROBITUSSIN DM) 100-10 MG/5ML syrup 5 mL  5 mL Oral Q4H PRN Rise Patience, MD   5 mL at 08/01/15 1229  . heparin injection 1,000 Units  1,000 Units Dialysis PRN Roney Jaffe, MD      . Derrill Memo ON 08/02/2015] heparin injection 3,500 Units  3,500 Units Dialysis PRN Roney Jaffe, MD      . heparin injection 5,000 Units  5,000 Units Subcutaneous 3 times per day Rise Patience, MD   5,000 Units at 08/01/15 0523  . insulin aspart (novoLOG) injection 0-9 Units  0-9 Units Subcutaneous TID WC Verlee Monte, MD   2 Units at 08/01/15 1235  . lidocaine (PF) (XYLOCAINE) 1 % injection 5 mL  5 mL Intradermal PRN Valentina Gu, NP      . lidocaine-prilocaine (  EMLA) cream 1 application  1 application Topical PRN Roney Jaffe, MD      . methocarbamol (ROBAXIN) tablet 500 mg  500 mg Oral Q8H PRN Rise Patience, MD   500 mg at 08/01/15 1229  . metoprolol tartrate (LOPRESSOR) tablet 25 mg  25 mg Oral BID Rise Patience, MD   25 mg at 07/31/15 2121  . mupirocin ointment (BACTROBAN) 2 % 1 application  1 application Nasal BID Verlee Monte, MD   1 application at [id number]  . pantoprazole (PROTONIX) EC tablet 40 mg  40 mg Oral Daily Rise Patience, MD   40 mg at 07/31/15 1135  . pentafluoroprop-tetrafluoroeth (GEBAUERS) aerosol 1 application  1 application Topical PRN Valentina Gu, NP      . polyethylene glycol (MIRALAX / GLYCOLAX) packet 17 g  17 g Oral Daily PRN Rise Patience, MD      . senna-docusate (Senokot-S) tablet 2 tablet  2 tablet Oral QHS PRN Rise Patience, MD   2 tablet at 07/31/15 2121  . sevelamer carbonate (RENVELA) tablet 800 mg  800 mg Oral TID WC Rise Patience, MD   800 mg at 08/01/15 0800  . sodium chloride flush (NS) 0.9 % injection 3 mL  3 mL Intravenous Q12H Rise Patience, MD   3 mL at 08/01/15 1000  . tiotropium (SPIRIVA) inhalation capsule 18 mcg  18 mcg Inhalation Daily Rise Patience, MD   18 mcg at 08/01/15 0936  . vancomycin (VANCOCIN) IVPB 750 mg/150 ml premix  750 mg Intravenous Q M,W,F-HD Erenest Blank, RPH   750 mg at 07/31/15 1009  . vancomycin (VANCOCIN) IVPB 750 mg/150 ml premix  750  mg Intravenous Q Thu-HD Rolla Flatten, [facility]         Discharge Medications: Please see discharge summary for a list of discharge medications.  Relevant Imaging Results:  Relevant Lab Results:   Additional Information Patient receives dialysis treatments 3 times per week-MWF at [facility].  [id number]  Sable Feil, LCSW

## 2015-08-01 NOTE — Progress Notes (Signed)
OT Cancellation Note  Patient Details Name: Terry [name] MRN: [id number] DOB: 1934-12-09   Cancelled Treatment:    Reason Eval/Treat Not Completed: Patient at procedure or test/ unavailable (HD). Will check back as able and schedule allows for OT treatment.   Chrys Racer , MS, OTR/L, CLT Pager: [phone]  08/01/2015, 1:21 PM

## 2015-08-01 NOTE — Progress Notes (Signed)
[facility] PROGRESS NOTE  Terry [name] [id number] DOB: 1934-11-04 DOA: 07/28/2015 PCP: [name], DO  Brief narrative 81 year old male with history of end-stage renal disease on hemodialysis (M, W, F ), CAD status post stenting, chronic A. fib who was admitted one month back for acute respiratory failure requiring intubation and had a complicated pneumothorax or guarding chest tube placement and GI bleed for which anticoagulation was discontinued at that time. Patient was discharged to [facility] skilled nursing facility. He was sent to the ED due to sudden onset of shortness of breath since 1 day. Patient was having productive cough off-and-on for past 1 week and reportedly was placed on oral antibiotics at the facility. In the ED he was found to be in acute hypoxic respiratory failure and was placed initially on BiPAP and was weaned off in the ED. Chest showed right basilar infiltrate and patient was admitted for acute hypoxic respiratory failure secondary to healthcare associated pneumonia and volume overload.  Assessment/Plan: Acute hypoxic respiratory failure Secondary to combination of right lobar pneumonia and fluid overload. Improved with dialysis and maintaining O2 sat on nasal cannula . Repeat two-view chest x-ray on 2/8 shows unchanged infiltrate or small pleural effusion.. Remains afebrile. Symptomatically appears better today. -Continue empiric antibiotic for now. Will likely need continuous O2 upon discharge.  COPD gold III  Continue home inhalers and when necessary nebulizer. Will likely need continuous O2 upon discharge.  End-stage renal disease on dialysis Receiving scheduled hemodialysis. Has bilateral leg edema. Doppler negative for DVT. Had scheduled hemodialysis today but given persistent volume overload plan on repeat dialysis today. Renal following. Continue hectoral.   A. fib  anticoagulation discontinued during recent hospitalization due to GI bleed.  Continue beta blocker. Rate controlled.  Anemia of chronic disease Hemoglobin stable.   Hypertension Stable. Continue beta blocker.      Type 2 diabetes mellitus Stable. Continue sliding scale coverage.  DVT prophylaxis: Subcutaneous heparin  Diet: Renal.   Code Status: Full code Family Communication: Discussed with wife on 2/8 Disposition Plan: Possible discharge back to skilled nursing facility on 2/11 if continues to improve. Patient agrees ongoing back to movement full.   Consultants:  Nephrology  Procedures:  None  Antibiotics:  IV vancomycin and cefepime since 2/5  HPI/Subjective: Seen and examined. Reports his breathing to be slightly better. O2 sat [place] on nasal cannula. Remains afebrile.   Objective: Filed Vitals:   08/01/15 0430 08/01/15 0931  BP: 124/43 121/46  Pulse: 94 108  Temp: 98.2 F (36.8 C) 98.4 F (36.9 C)  Resp: 21 22    Intake/Output Summary (Last 24 hours) at 08/01/15 1129 Last data filed at 08/01/15 0932  Gross per 24 hour  Intake    770 ml  Output      0 ml  Net    770 ml   Filed Weights   07/30/15 2028 07/31/15 0721 07/31/15 1100  Weight: 76.5 kg (168 lb 10.4 oz) 78.5 kg (173 lb 1 oz) 74.9 kg (165 lb 2 oz)    Exam:   General:  Elderly male lying in bed appears fatigued him on not in distress  HEENT: Moist Mucosa  Chest: Diminished Bilateral breath sounds  CVS: Normal S1 and S2, no murmurs or gallop  GI: Soft, nondistended, nontender, bowel sounds present  Musculoskeletal: 2+ pitting bilaterally (improved from yesterday)  CNS: Alert and oriented  Data Reviewed: Basic Metabolic Panel:  Recent Labs Lab 07/28/15 2135 07/29/15 0600 07/30/15 0012  NA 130* 130*  135  K 4.9 4.5 3.6  CL 90* 92* 97*  CO2 27 23 24   GLUCOSE 157* 206* 227*  BUN 57* 61* 30*  CREATININE 3.73* 4.01* 2.42*  CALCIUM 8.7* 8.4* 8.4*   Liver Function Tests:  Recent Labs Lab 07/29/15 0600  AST 22  ALT 19  ALKPHOS 60   BILITOT 0.8  PROT 6.2*  ALBUMIN 2.9*   No results for input(s): LIPASE, AMYLASE in the last 168 hours. No results for input(s): AMMONIA in the last 168 hours. CBC:  Recent Labs Lab 07/28/15 2135 07/29/15 0600 07/30/15 0012 07/31/15 0736 08/01/15 0817  WBC 16.1* 14.2* 15.0* 16.8* 15.0*  NEUTROABS  --  13.5*  --  13.5*  --   HGB 12.2* 12.0* 11.0* 10.6* 11.3*  HCT 37.9* 37.8* 34.1* 34.4* 35.9*  MCV 93.8 94.3 93.2 93.2 95.2  PLT 265 228 206 213 173   Cardiac Enzymes:  Recent Labs Lab 07/28/15 2135 07/29/15 0841 07/29/15 2055  TROPONINI 0.14* <0.03 <0.03   BNP (last 3 results)  Recent Labs  05/13/15 2017 06/08/15 0016 07/28/15 2135  BNP 362.1* 892.3* 432.6*    ProBNP (last 3 results) No results for input(s): PROBNP in the last 8760 hours.  CBG:  Recent Labs Lab 07/30/15 2026 07/31/15 1206 07/31/15 1621 07/31/15 2043 08/01/15 0855  GLUCAP 129* 96 247* 75 128*    Recent Results (from the past 240 hour(s))  Culture, blood (routine x 2)     Status: None (Preliminary result)   Collection Time: 07/28/15  9:35 PM  Result Value Ref Range Status   Specimen Description BLOOD LEFT ARM  Final   Special Requests BOTTLES DRAWN AEROBIC AND ANAEROBIC 10CC  Final   Culture NO GROWTH 3 DAYS  Final   Report Status PENDING  Incomplete  Culture, blood (routine x 2)     Status: None (Preliminary result)   Collection Time: 07/28/15 10:19 PM  Result Value Ref Range Status   Specimen Description BLOOD LEFT HAND  Final   Special Requests BOTTLES DRAWN AEROBIC AND ANAEROBIC 5CC  Final   Culture NO GROWTH 2 DAYS  Final   Report Status PENDING  Incomplete  MRSA PCR Screening     Status: Abnormal   Collection Time: 07/29/15  8:40 AM  Result Value Ref Range Status   MRSA by PCR POSITIVE (A) NEGATIVE Final    Comment:        The GeneXpert MRSA Assay (FDA approved for NASAL specimens only), is one component of a comprehensive MRSA colonization surveillance program. It is  not intended to diagnose MRSA infection nor to guide or monitor treatment for MRSA infections. RESULT CALLED TO, READ BACK BY AND VERIFIED WITH: AGUIRRE RN 10:15 07/29/15 ([email or web address])   Culture, sputum-assessment     Status: None   Collection Time: 07/29/15  8:56 AM  Result Value Ref Range Status   Specimen Description SPUTUM  Final   Special Requests NONE  Final   Sputum evaluation   Final    MICROSCOPIC FINDINGS SUGGEST THAT THIS SPECIMEN IS NOT REPRESENTATIVE OF LOWER RESPIRATORY SECRETIONS. PLEASE RECOLLECT. RESULT CALLED TO, READ BACK BY AND VERIFIED WITH: Netta Corrigan RN 11:00 07/29/15 ([email or web address])    Report Status 07/29/2015 FINAL  Final     Studies: Dg Chest 2 View  07/31/2015  CLINICAL DATA:  Shortness of breath and wheezing EXAM: CHEST  2 VIEW COMPARISON:  07/28/2015 FINDINGS: Cardiac shadow is stable. A dialysis catheter is again seen. Persistent right basilar infiltrate is seen. No new  focal abnormality is noted. Small amount of pleural fluid is noted on the right as well. IMPRESSION: Persistent right basilar infiltrate with small pleural effusion. Electronically Signed   By: Inez Catalina M.D.   On: 07/31/2015 13:19    Scheduled Meds: . albuterol  2.5 mg Nebulization TID  . amLODipine  10 mg Oral Daily  . atorvastatin  20 mg Oral q1800  . budesonide-formoterol  2 puff Inhalation BID  . ceFEPime (MAXIPIME) IV  1 g Intravenous Once  . ceFEPime (MAXIPIME) IV  2 g Intravenous Q M,W,F-2000  . Chlorhexidine Gluconate Cloth  6 each Topical Q0600  . feeding supplement (NEPRO CARB STEADY)  237 mL Oral BID AC  . heparin  5,000 Units Subcutaneous 3 times per day  . insulin aspart  0-9 Units Subcutaneous TID WC  . metoprolol tartrate  25 mg Oral BID  . mupirocin ointment  1 application Nasal BID  . pantoprazole  40 mg Oral Daily  . sevelamer carbonate  800 mg Oral TID WC  . sodium chloride flush  3 mL Intravenous Q12H  . tiotropium  18 mcg Inhalation Daily  . vancomycin  750 mg Intravenous  Q M,W,F-HD  . vancomycin  750 mg Intravenous Q Thu-HD   Continuous Infusions:     Time spent: 25 minutes    Zooey Schreurs, [place]  [facility] Pager [phone]. If 7PM-7AM, please contact night-coverage at [email or web address], password [facility] 08/01/2015, 11:29 AM  LOS: 4 days

## 2015-08-01 NOTE — Progress Notes (Signed)
Pharmacy Antibiotic Note  Terry [name] is a 81 y.o. male admitted on 07/28/2015 with pneumonia.  Pharmacy has been consulted for Vancomycin + Cefepime dosing.  The patient is ESRD with a normal HD schedule of MWF however the patient plans to dialyze off-schedule today to get extra fluid/volume off. Will enter extra Vancomycin and Cefepime doses today to match the patient's off-schedule HD.   Plan: 1. Vancomycin 750 mg post HD today then continue Vancomycin 750 mg post HD-MWF 2. Cefepime 1g IV x 1 dose this evening after HD then continue Cefepime 2g on MWF @ 2000.  3. The recommended duration for HCAP is 7 days - today is D#5 of antibiotic treatment, consider adding a stop date for 2/11 4. Will continue to follow HD schedule/duration, culture results, LOT, and antibiotic de-escalation plans   Height: 5\' 8"  (172.7 cm) Weight: 165 lb 2 oz (74.9 kg) IBW/kg (Calculated) : 68.4  Temp (24hrs), Avg:98.4 F (36.9 C), Min:98.2 F (36.8 C), Max:98.7 F (37.1 C)   Recent Labs Lab 07/28/15 2135 07/28/15 2156 07/29/15 0600 07/30/15 0012 07/31/15 0736 08/01/15 0817  WBC 16.1*  --  14.2* 15.0* 16.8* 15.0*  CREATININE 3.73*  --  4.01* 2.42*  --   --   LATICACIDVEN  --  2.13*  --   --   --   --     Estimated Creatinine Clearance: 24.3 mL/min (by C-G formula based on Cr of 2.42).    Allergies  Allergen Reactions  . Yellow Dyes (Non-Tartrazine) Other (See Comments)    Burns arms Cough medications (tussionex and benzonatate ok)  . Levaquin [Levofloxacin In D5w] Itching    Antimicrobials this admission: Augmentin PTA Vanc 2/5 >> Cefepime 2/5 >>  Dose adjustments this admission: n/a  Microbiology results: 2/5 BCx >> ngtd 2/6 MRSA PCR >> positive 2/6 RCx >> bad sample Other: HIV neg  Thank you for allowing pharmacy to be a part of this patient's care.  Alycia Rossetti, PharmD, BCPS Clinical Pharmacist Pager: [phone] 08/01/2015 11:08 AM

## 2015-08-01 NOTE — Progress Notes (Signed)
Attempted to wean pt off of 02 after dialysis. Pt did not tolerate. 87% on room air, encouraged coughing, moderate sputum produced. Placed on 2L of oxygen will continue to monitor

## 2015-08-02 DIAGNOSIS — J441 Chronic obstructive pulmonary disease with (acute) exacerbation: Secondary | ICD-10-CM | POA: Diagnosis present

## 2015-08-02 DIAGNOSIS — E1122 Type 2 diabetes mellitus with diabetic chronic kidney disease: Secondary | ICD-10-CM | POA: Diagnosis present

## 2015-08-02 DIAGNOSIS — E8889 Other specified metabolic disorders: Secondary | ICD-10-CM | POA: Diagnosis present

## 2015-08-02 DIAGNOSIS — R05 Cough: Secondary | ICD-10-CM | POA: Diagnosis not present

## 2015-08-02 DIAGNOSIS — D500 Iron deficiency anemia secondary to blood loss (chronic): Secondary | ICD-10-CM | POA: Diagnosis not present

## 2015-08-02 DIAGNOSIS — I482 Chronic atrial fibrillation: Secondary | ICD-10-CM | POA: Diagnosis present

## 2015-08-02 DIAGNOSIS — E7800 Pure hypercholesterolemia, unspecified: Secondary | ICD-10-CM | POA: Diagnosis present

## 2015-08-02 DIAGNOSIS — I132 Hypertensive heart and chronic kidney disease with heart failure and with stage 5 chronic kidney disease, or end stage renal disease: Secondary | ICD-10-CM | POA: Diagnosis present

## 2015-08-02 DIAGNOSIS — J156 Pneumonia due to other aerobic Gram-negative bacteria: Secondary | ICD-10-CM | POA: Diagnosis not present

## 2015-08-02 DIAGNOSIS — Z955 Presence of coronary angioplasty implant and graft: Secondary | ICD-10-CM | POA: Diagnosis not present

## 2015-08-02 DIAGNOSIS — J9600 Acute respiratory failure, unspecified whether with hypoxia or hypercapnia: Secondary | ICD-10-CM | POA: Diagnosis not present

## 2015-08-02 DIAGNOSIS — J449 Chronic obstructive pulmonary disease, unspecified: Secondary | ICD-10-CM | POA: Diagnosis not present

## 2015-08-02 DIAGNOSIS — B965 Pseudomonas (aeruginosa) (mallei) (pseudomallei) as the cause of diseases classified elsewhere: Secondary | ICD-10-CM | POA: Diagnosis present

## 2015-08-02 DIAGNOSIS — M1990 Unspecified osteoarthritis, unspecified site: Secondary | ICD-10-CM | POA: Diagnosis present

## 2015-08-02 DIAGNOSIS — I25700 Atherosclerosis of coronary artery bypass graft(s), unspecified, with unstable angina pectoris: Secondary | ICD-10-CM | POA: Diagnosis not present

## 2015-08-02 DIAGNOSIS — Z992 Dependence on renal dialysis: Secondary | ICD-10-CM | POA: Diagnosis not present

## 2015-08-02 DIAGNOSIS — J440 Chronic obstructive pulmonary disease with acute lower respiratory infection: Secondary | ICD-10-CM | POA: Diagnosis present

## 2015-08-02 DIAGNOSIS — Z91041 Radiographic dye allergy status: Secondary | ICD-10-CM | POA: Diagnosis not present

## 2015-08-02 DIAGNOSIS — Z881 Allergy status to other antibiotic agents status: Secondary | ICD-10-CM | POA: Diagnosis not present

## 2015-08-02 DIAGNOSIS — I5022 Chronic systolic (congestive) heart failure: Secondary | ICD-10-CM | POA: Diagnosis present

## 2015-08-02 DIAGNOSIS — R0602 Shortness of breath: Secondary | ICD-10-CM | POA: Diagnosis present

## 2015-08-02 DIAGNOSIS — I10 Essential (primary) hypertension: Secondary | ICD-10-CM

## 2015-08-02 DIAGNOSIS — Z9981 Dependence on supplemental oxygen: Secondary | ICD-10-CM | POA: Diagnosis not present

## 2015-08-02 DIAGNOSIS — I5032 Chronic diastolic (congestive) heart failure: Secondary | ICD-10-CM | POA: Diagnosis present

## 2015-08-02 DIAGNOSIS — N2581 Secondary hyperparathyroidism of renal origin: Secondary | ICD-10-CM | POA: Diagnosis present

## 2015-08-02 DIAGNOSIS — D638 Anemia in other chronic diseases classified elsewhere: Secondary | ICD-10-CM | POA: Diagnosis present

## 2015-08-02 DIAGNOSIS — M6281 Muscle weakness (generalized): Secondary | ICD-10-CM | POA: Diagnosis not present

## 2015-08-02 DIAGNOSIS — Z8673 Personal history of transient ischemic attack (TIA), and cerebral infarction without residual deficits: Secondary | ICD-10-CM | POA: Diagnosis not present

## 2015-08-02 DIAGNOSIS — R069 Unspecified abnormalities of breathing: Secondary | ICD-10-CM | POA: Diagnosis not present

## 2015-08-02 DIAGNOSIS — J9621 Acute and chronic respiratory failure with hypoxia: Secondary | ICD-10-CM | POA: Diagnosis present

## 2015-08-02 DIAGNOSIS — B961 Klebsiella pneumoniae [K. pneumoniae] as the cause of diseases classified elsewhere: Secondary | ICD-10-CM | POA: Diagnosis present

## 2015-08-02 DIAGNOSIS — I4891 Unspecified atrial fibrillation: Secondary | ICD-10-CM | POA: Diagnosis not present

## 2015-08-02 DIAGNOSIS — J150 Pneumonia due to Klebsiella pneumoniae: Secondary | ICD-10-CM | POA: Diagnosis not present

## 2015-08-02 DIAGNOSIS — R0789 Other chest pain: Secondary | ICD-10-CM | POA: Diagnosis not present

## 2015-08-02 DIAGNOSIS — Z87891 Personal history of nicotine dependence: Secondary | ICD-10-CM | POA: Diagnosis not present

## 2015-08-02 DIAGNOSIS — K922 Gastrointestinal hemorrhage, unspecified: Secondary | ICD-10-CM | POA: Diagnosis not present

## 2015-08-02 DIAGNOSIS — N186 End stage renal disease: Secondary | ICD-10-CM | POA: Diagnosis present

## 2015-08-02 DIAGNOSIS — Z8701 Personal history of pneumonia (recurrent): Secondary | ICD-10-CM | POA: Diagnosis not present

## 2015-08-02 DIAGNOSIS — I2510 Atherosclerotic heart disease of native coronary artery without angina pectoris: Secondary | ICD-10-CM | POA: Diagnosis present

## 2015-08-02 DIAGNOSIS — J9601 Acute respiratory failure with hypoxia: Secondary | ICD-10-CM | POA: Diagnosis not present

## 2015-08-02 DIAGNOSIS — I120 Hypertensive chronic kidney disease with stage 5 chronic kidney disease or end stage renal disease: Secondary | ICD-10-CM | POA: Diagnosis not present

## 2015-08-02 DIAGNOSIS — J189 Pneumonia, unspecified organism: Secondary | ICD-10-CM | POA: Diagnosis not present

## 2015-08-02 DIAGNOSIS — R8271 Bacteriuria: Secondary | ICD-10-CM | POA: Diagnosis present

## 2015-08-02 DIAGNOSIS — Z7951 Long term (current) use of inhaled steroids: Secondary | ICD-10-CM | POA: Diagnosis not present

## 2015-08-02 LAB — CULTURE, BLOOD (ROUTINE X 2): Culture: NO GROWTH

## 2015-08-02 LAB — CBC
HEMATOCRIT: 34.8 % — AB (ref 39.0–52.0)
HEMOGLOBIN: 10.5 g/dL — AB (ref 13.0–17.0)
MCH: 28.5 pg (ref 26.0–34.0)
MCHC: 30.2 g/dL (ref 30.0–36.0)
MCV: 94.3 fL (ref 78.0–100.0)
PLATELETS: 191 10*3/uL (ref 150–400)
RBC: 3.69 MIL/uL — AB (ref 4.22–5.81)
RDW: 18.7 % — ABNORMAL HIGH (ref 11.5–15.5)
WBC: 14 10*3/uL — AB (ref 4.0–10.5)

## 2015-08-02 LAB — RENAL FUNCTION PANEL
Albumin: 2.3 g/dL — ABNORMAL LOW (ref 3.5–5.0)
Anion gap: 14 (ref 5–15)
BUN: 27 mg/dL — ABNORMAL HIGH (ref 6–20)
CHLORIDE: 93 mmol/L — AB (ref 101–111)
CO2: 25 mmol/L (ref 22–32)
CREATININE: 2.9 mg/dL — AB (ref 0.61–1.24)
Calcium: 8.6 mg/dL — ABNORMAL LOW (ref 8.9–10.3)
GFR, EST AFRICAN AMERICAN: 22 mL/min — AB (ref >60)
GFR, EST NON AFRICAN AMERICAN: 19 mL/min — AB (ref >60)
Glucose, Bld: 108 mg/dL — ABNORMAL HIGH (ref 65–99)
Phosphorus: 3 mg/dL (ref 2.5–4.6)
Potassium: 4 mmol/L (ref 3.5–5.1)
Sodium: 132 mmol/L — ABNORMAL LOW (ref 135–145)

## 2015-08-02 LAB — GLUCOSE, CAPILLARY
GLUCOSE-CAPILLARY: 105 mg/dL — AB (ref 65–99)
GLUCOSE-CAPILLARY: 98 mg/dL (ref 65–99)
Glucose-Capillary: 177 mg/dL — ABNORMAL HIGH (ref 65–99)
Glucose-Capillary: 119 mg/dL — ABNORMAL HIGH (ref 65–99)
Glucose-Capillary: 124 mg/dL — ABNORMAL HIGH (ref 65–99)

## 2015-08-02 MED ORDER — HYDROCODONE-HOMATROPINE 5-1.5 MG/5ML PO SYRP: 5.0000 mL | ORAL_SOLUTION | Freq: Four times a day (QID) | ORAL | Status: DC | PRN

## 2015-08-02 MED ORDER — CLONAZEPAM 0.5 MG PO TABS
ORAL_TABLET | ORAL | Status: AC
  Filled 2015-08-02: qty 2

## 2015-08-02 MED ORDER — METHOCARBAMOL 500 MG PO TABS: 500.0000 mg | ORAL_TABLET | Freq: Three times a day (TID) | ORAL | Status: DC | PRN

## 2015-08-02 MED ORDER — VANCOMYCIN HCL IN DEXTROSE 750-5 MG/150ML-% IV SOLN
INTRAVENOUS | Status: AC
  Filled 2015-08-02: qty 150

## 2015-08-02 MED ORDER — METOPROLOL TARTRATE 25 MG PO TABS: 50.0000 mg | ORAL_TABLET | Freq: Two times a day (BID) | ORAL | Status: DC

## 2015-08-02 MED ORDER — DOXERCALCIFEROL 4 MCG/2ML IV SOLN
INTRAVENOUS | Status: AC
  Filled 2015-08-02: qty 2

## 2015-08-02 MED ORDER — MAGIC MOUTHWASH
10.0000 mL | Freq: Four times a day (QID) | ORAL | Status: DC | PRN
  Administered 2015-08-03: 10 mL via ORAL
  Filled 2015-08-02: qty 10

## 2015-08-02 MED ORDER — DOXERCALCIFEROL 4 MCG/2ML IV SOLN
1.0000 ug | INTRAVENOUS | Status: DC
  Administered 2015-08-02: 1 ug via INTRAVENOUS
  Filled 2015-08-02: qty 2

## 2015-08-02 NOTE — Progress Notes (Addendum)
Order to discharge patient, transportation called twice. Transportation not available per [facility]AR, ''will be here when one is available''. Will follow up.

## 2015-08-02 NOTE — Progress Notes (Signed)
[facility] Progress Note  Assessment/Plan: 1. Acute respiratory failure with hypoxia: combination underlying severe COPD/ small PNA or bronchitis/ prob fluid overload component.  Improved clinically. On sched HD today w further vol removal as BP tolerates. Will decrease dry wt at dc. OK for dc from renal standpoint.  2. ESRD - MWF HD. HD today.  3. Hypertension/volume - BP's low normal, on MTP only. 2kg under dry wt 4. Anemia - 11.3 today. Recent dose of ESA. Follow HGB.  5. Metabolic bone disease - Ca Last in-center phos 2.0. Hold binders for now, continue hectoral.  6. Nutrition - Albumin 2.9. Renal diet with nepro/renal vit 7. Afib: per primary. No anticoagulants due to UGIB.  8. COPD GOLD III: per primary. Remains on O2 via [facility] [place]. O2 Sats 93%. 9. Dispo - dc to SNF prob tomorrow    Kelly Splinter MD [place] pager [phone]    cell [phone] 08/02/2015, 10:26 AM    Subjective: stable no complaints.    Objective Filed Vitals:   08/01/15 2132 08/02/15 0548 08/02/15 0900 08/02/15 0905  BP: 105/47 123/49 117/62   Pulse: 100 98 111   Temp: 98.1 F (36.7 C) 97.8 F (36.6 C) 97.7 F (36.5 C)   TempSrc: Oral Oral Oral   Resp: 17  18   Height:      Weight:      SpO2: 94% 95% 94% 95%   Physical Exam General: chronically ill appearing male, denies distress. Heart: Afib.Rate 90s-110. HS irregular II/VI systolic M.  Lungs: Bilateral breath sounds, over all sounds diminished, few coarse breath sounds upper lung fields. DOE/mild WOB noted. Conts to have productive cough with blood streaked sputum.  Abdomen: soft nontender Extremities:bilateral LE edema. faint pulses.  Dialysis Access: RIJ tunneled HD catheter, drsg CDI. Maturing R brachiocephalic AVF placed 0000000 + bruit  Dialysis Orders: [facility] MonWedFri, 4 hrs 0 min, 180NRe Optiflux, BFR 400, DFR Manual 800 mL/min, EDW 77 (kg), Dialysate 2.0 K, 2.25 Ca, UFR Profile: None, Sodium Model:  None, Access: RIJ Catheter-Tunneled Heparin: 8600 units per treatment q MWF Mircera 225 mcg IV Q 2 weeks (last dose 07/24/15) Venofer 50 mg IV weekly (last dose 07/24/15) Hectoral: 1 mcg IV q treatment MWF (last dose 07/26/15) Additional Objective Labs: Basic Metabolic Panel:  Recent Labs Lab 07/29/15 0600 07/30/15 0012 08/01/15 1300  NA 130* 135 133*  K 4.5 3.6 3.7  CL 92* 97* 97*  CO2 23 24 27   GLUCOSE 206* 227* 149*  BUN 61* 30* 32*  CREATININE 4.01* 2.42* 2.78*  CALCIUM 8.4* 8.4* 8.2*  PHOS  --   --  2.3*   Liver Function Tests:  Recent Labs Lab 07/29/15 0600 08/01/15 1300  AST 22  --   ALT 19  --   ALKPHOS 60  --   BILITOT 0.8  --   PROT 6.2*  --   ALBUMIN 2.9* 2.4*   No results for input(s): LIPASE, AMYLASE in the last 168 hours. CBC:  Recent Labs Lab 07/29/15 0600 07/30/15 0012 07/31/15 0736 08/01/15 0817 08/01/15 1301  WBC 14.2* 15.0* 16.8* 15.0* 13.8*  NEUTROABS 13.5*  --  13.5*  --   --   HGB 12.0* 11.0* 10.6* 11.3* 10.3*  HCT 37.8* 34.1* 34.4* 35.9* 33.7*  MCV 94.3 93.2 93.2 95.2 94.4  PLT 228 206 213 173 184   Blood Culture    Component Value Date/Time   SDES SPUTUM 07/29/2015 0856   SPECREQUEST NONE 07/29/2015 0856   CULT NO  GROWTH 3 DAYS 07/28/2015 2219   REPTSTATUS 07/29/2015 FINAL 07/29/2015 0856    Cardiac Enzymes:  Recent Labs Lab 07/28/15 2135 07/29/15 0841 07/29/15 2055  TROPONINI 0.14* <0.03 <0.03   CBG:  Recent Labs Lab 08/01/15 0855 08/01/15 1159 08/01/15 1703 08/01/15 2122 08/02/15 0758  GLUCAP 128* 158* 94 177* 105*   Iron Studies: No results for input(s): IRON, TIBC, TRANSFERRIN, FERRITIN in the last 72 hours. @lablastinr3 @ Studies/Results: Dg Chest 2 View  07/31/2015  CLINICAL DATA:  Shortness of breath and wheezing EXAM: CHEST  2 VIEW COMPARISON:  07/28/2015 FINDINGS: Cardiac shadow is stable. A dialysis catheter is again seen. Persistent right basilar infiltrate is seen. No new focal abnormality is  noted. Small amount of pleural fluid is noted on the right as well. IMPRESSION: Persistent right basilar infiltrate with small pleural effusion. Electronically Signed   By: Inez Catalina M.D.   On: 07/31/2015 13:19   Medications:   . albuterol  2.5 mg Nebulization TID  . amLODipine  10 mg Oral Daily  . atorvastatin  20 mg Oral q1800  . budesonide-formoterol  2 puff Inhalation BID  . ceFEPime (MAXIPIME) IV  2 g Intravenous Q M,W,F-2000  . Chlorhexidine Gluconate Cloth  6 each Topical Q0600  . doxercalciferol  1 mcg Intravenous Q M,W,F-HD  . feeding supplement (NEPRO CARB STEADY)  237 mL Oral BID AC  . heparin  5,000 Units Subcutaneous 3 times per day  . insulin aspart  0-9 Units Subcutaneous TID WC  . metoprolol tartrate  25 mg Oral BID  . mupirocin ointment  1 application Nasal BID  . pantoprazole  40 mg Oral Daily  . sodium chloride flush  3 mL Intravenous Q12H  . tiotropium  18 mcg Inhalation Daily  . vancomycin  750 mg Intravenous Q M,W,F-HD

## 2015-08-02 NOTE — Progress Notes (Signed)
Patient to be discharged back to SNF after dialysis.Report called to [facility].

## 2015-08-02 NOTE — Progress Notes (Signed)
Occupational Therapy Treatment Patient Details Name: Terry [name] MRN: [id number] DOB: 1935/05/07 Today's Date: 08/02/2015    History of present illness This 81 y.o. male admitted from [facility] due to sudden onset SOB and productive cough x 1 week.  Dx: ARF due to PNA and fluid overload.  PMH includes:  ESRD with HD M,W,F; CAD, chronic Afib, COPD   OT comments  Patient making slow progress towards goals, pt somewhat self-limiting. Pt takes increased time to complete tasks and requires moderate encouragement. Pt with decreased initiation, however pt feels he is rushed to do things.   Follow Up Recommendations  SNF    Equipment Recommendations  Other (comment) (TBD next venue of care)    Recommendations for Other Services  None at this time   Precautions / Restrictions Precautions Precautions: Fall Restrictions Weight Bearing Restrictions: No    Mobility Bed Mobility Overal bed mobility: Needs Assistance Bed Mobility: Supine to Sit;Sit to Supine     Supine to sit: Supervision Sit to supine: Supervision   General bed mobility comments: supervision for safety  Transfers Overall transfer level: Needs assistance Equipment used: Rolling walker (2 wheeled) Transfers: Sit to/from Stand Sit to Stand: Min guard General transfer comment: Min guard for safety, cues for safety and technique/hand placement    Balance Overall balance assessment: Needs assistance Sitting-balance support: No upper extremity supported;Feet supported Sitting balance-Leahy Scale: Good     Standing balance support: Bilateral upper extremity supported;During functional activity Standing balance-Leahy Scale: Fair   ADL Overall ADL's : Needs assistance/impaired Eating/Feeding: Set up;Sitting   Lower Body Dressing: Minimal assistance;Sit to/from stand General ADL Comments: Pt not wanting to participate much in this therapy session, but did with moderate cueing and encouragement. Pt needed  increased time. Pt able to reach LLE and needed assistance with RLE, "I just can't do it!". Pt ambulated to and from door on 2L/min supplemental 02 via [place]. Pt sat EOB and sats dropped to 86%, sats remained below 90% until therapist bumped patient up to 3L/min supplemental 02 with seated rest break and pursed lip breathing. Left pt seated EOB per his request, bed alarm set.      Cognition   Behavior During Therapy: Agitated Overall Cognitive Status: Within Functional Limits for tasks assessed                 Pertinent Vitals/ Pain       Pain Assessment: No/denies pain   Frequency Min 2X/week     Progress Toward Goals  OT Goals(current goals can now befound in the care plan section)  Progress towards OT goals: Progressing toward goals  Acute Rehab OT Goals Patient Stated Goal: none stated OT Goal Formulation: With patient Time For Goal Achievement: 08/13/15 Potential to Achieve Goals: Good  Plan Discharge plan remains appropriate       End of Session Equipment Utilized During Treatment: Oxygen;Rolling walker   Activity Tolerance Other (comment) (limited by agitation)   Patient Left in bed;with call bell/phone within reach;with bed alarm set (seated EOB)     Time: [id number] OT Time Calculation (min): 19 min  Charges: OT General Charges $OT Visit: 1 Procedure OT Treatments $Self Care/Home Management : 8-22 mins  Chrys Racer , MS, OTR/L, CLT Pager: [phone]  08/02/2015, 2:38 PM

## 2015-08-02 NOTE — Discharge Summary (Signed)
Physician Discharge Summary  Terry [name] [id number] DOB: Jun 14, 1935 DOA: 07/28/2015  PCP: Colin Benton R., DO  Admit date: 07/28/2015 Discharge date: 08/02/2015  Time spent:  35 minutes  Recommendations for Outpatient Follow-up:  1. Discharge to skilled nursing facility ([facility]). Follow-up with scheduled outpatient hemodialysis. Patient has received 6 days of IV antibiotics already and does not need further antibiotic. Next Antibiotic dose was otherwise scheduled on 2/13. 2. Please repeat two-view chest x-ray in 4 weeks to ensure resolution.  Discharge Diagnoses:  Principal Problem:   Acute respiratory failure with hypoxia ([place])   Active Problems:   HCAP (healthcare-associated pneumonia)   COPD GOLD III   Essential hypertension   End-stage renal disease on hemodialysis ([place])   Chronic diastolic heart failure ([place])   Discharge Condition: Fair  Diet recommendation: Renal  CODE STATUS: Full code  Filed Weights   08/01/15 1232 08/01/15 1250 08/01/15 1620  Weight: 77.6 kg (171 lb 1.2 oz) 77.6 kg (171 lb 1.2 oz) 75.4 kg (166 lb 3.6 oz)    History of present illness:  Please refer to admission H&P for details, in brief,81 year old male with history of end-stage renal disease on hemodialysis (M, W, F ), CAD status post stenting, chronic A. fib who was admitted one month back for acute respiratory failure requiring intubation and had a complicated pneumothorax or guarding chest tube placement and GI bleed for which anticoagulation was discontinued at that time. Patient was discharged to [facility] skilled nursing facility. He was sent to the ED due to sudden onset of shortness of breath since 1 day. Patient was having productive cough off-and-on for past 1 week and reportedly was placed on oral antibiotics at the facility. In the ED he was found to be in acute hypoxic respiratory failure and was placed initially on BiPAP and was weaned off in the ED. Chest showed right basilar  infiltrate and patient was admitted for acute hypoxic respiratory failure secondary to healthcare associated pneumonia and volume overload.  Hospital Course:  Acute hypoxic respiratory failure Secondary to combination of right lobar pneumonia and fluid overload. Improved with dialysis and maintaining 2 L O2 sat on nasal cannula . Repeat two-view chest x-ray on 2/8 shows unchanged infiltrate or small pleural effusion.. Remains afebrile. Symptomatically much improved today. -Treated with empiric vancomycin and cefepime. Cultures negative. He has received total 6 days of antibiotics as of today . Next abx dose would be on 2/13 but until then would be day 9 of antibiotics. He does not need further antibiotics. -Patient's respiratory failure is likely contributed also by underlying COPD. He would need continuous oxygen (2 L via nasal cannula) upon discharge and should be titrated as outpatient.  COPD gold III  Continue home inhalers and when necessary nebulizer. Needs 2 L via nasal cannula continuously.  End-stage renal disease on dialysis Receiving scheduled hemodialysis. Bilateral leg edema improved.. Doppler negative for DVT. Had scheduled hemodialysis on 2/8 , and 2/10 with an extra HD on 2/9 to pull out more fluid.  Continue hectoral. Patient much improved after dialysis.   A. fib anticoagulation discontinued during recent hospitalization due to GI bleed. Continue beta blocker. Rate controlled.  Anemia of chronic disease Hemoglobin stable.   Hypertension Stable. Continue beta blocker.  Type 2 diabetes mellitus Stable. Not on any medications.   Patient is clinically stable to be discharged back to skilled nursing facility.   Family Communication: Discussed with wife on the phone Disposition Plan: Return to skilled nursing facility.   Consultants:  Nephrology  Procedures:  None  Antibiotics:  IV vancomycin and cefepime since 2/5-2/10    Discharge Exam: Filed  Vitals:   08/02/15 0548 08/02/15 0900  BP: 123/49 117/62  Pulse: 98 111  Temp: 97.8 F (36.6 C) 97.7 F (36.5 C)  Resp:  18    General: Elderly male lying in bed appears fatigued,  not in distress  HEENT: Moist Mucosa supple neck  Chest: Diminished Bilateral breath sounds  CVS: Normal S1 and S2, no murmurs or gallop  GI: Soft, nondistended, nontender, bowel sounds present  Musculoskeletal: 2 trace pitting edema bilaterally (improved since admission)  CNS: Alert and oriented  Discharge Instructions    Current Discharge Medication List    CONTINUE these medications which have CHANGED   Details  HYDROcodone-homatropine (HYCODAN) 5-1.5 MG/5ML syrup Take 5 mLs by mouth every 6 (six) hours as needed for cough. Qty: 120 mL, Refills: 0    methocarbamol (ROBAXIN) 500 MG tablet Take 1 tablet (500 mg total) by mouth every 8 (eight) hours as needed for muscle spasms. Qty: 30 tablet, Refills: 0    metoprolol tartrate (LOPRESSOR) 25 MG tablet Take 2 tablets (50 mg total) by mouth 2 (two) times daily. 8am, 5pm Qty: 60 tablet, Refills: 3      CONTINUE these medications which have NOT CHANGED   Details  albuterol (VENTOLIN HFA) 108 (90 BASE) MCG/ACT inhaler Inhale 2 puffs into the lungs every 4 (four) hours as needed for wheezing or shortness of breath. Qty: 1 Inhaler, Refills: 0    amLODipine (NORVASC) 10 MG tablet Take 1 tablet (10 mg total) by mouth daily. Qty: 90 tablet, Refills: 3    atorvastatin (LIPITOR) 20 MG tablet Take 1 tablet (20 mg total) by mouth daily. Qty: 30 tablet, Refills: 6    benzonatate (TESSALON) 200 MG capsule Take 1 capsule (200 mg total) by mouth 3 (three) times daily as needed for cough. Qty: 60 capsule, Refills: 0    budesonide-formoterol (SYMBICORT) 160-4.5 MCG/ACT inhaler Inhale 2 puffs into the lungs 2 (two) times daily. Qty: 1 Inhaler, Refills: 6    cholecalciferol (VITAMIN D) 1000 units tablet Take 1,000 Units by mouth daily.     guaiFENesin-dextromethorphan (ROBITUSSIN DM) 100-10 MG/5ML syrup Take 5 mLs by mouth every 4 (four) hours as needed for cough. Qty: 118 mL, Refills: 0    ipratropium-albuterol (DUONEB) 0.5-2.5 (3) MG/3ML SOLN Take 3 mLs by nebulization 3 (three) times daily. Qty: 360 mL, Refills: 3    Nutritional Supplements (NEPRO) LIQD Take 1 Can by mouth 2 (two) times daily before lunch and supper.    pantoprazole (PROTONIX) 40 MG tablet Take 1 tablet (40 mg total) by mouth 2 (two) times daily before a meal.    polyethylene glycol (MIRALAX) packet Take 17 g by mouth daily as needed for moderate constipation. Qty: 30 each, Refills: 0    senna-docusate (SENOKOT S) 8.6-50 MG tablet Take 2 tablets by mouth at bedtime as needed for mild constipation. Qty: 60 tablet, Refills: 3    sevelamer carbonate (RENVELA) 800 MG tablet Take 1 tablet (800 mg total) by mouth 3 (three) times daily with meals.    tiotropium (SPIRIVA HANDIHALER) 18 MCG inhalation capsule INHALE THE CONTENTS OF ONE CAPSULE INTO LUNGS ONCE DAILY Qty: 30 capsule, Refills: 3      STOP taking these medications     amoxicillin-clavulanate (AUGMENTIN) 875-125 MG tablet      clonazePAM (KLONOPIN) 1 MG tablet        Allergies  Allergen Reactions  . Yellow Dyes (Non-Tartrazine) Other (See Comments)    Burns arms Cough medications (tussionex and benzonatate ok)  . Levaquin [Levofloxacin In D5w] Itching   Follow-up Information    Please follow up.   Why:  MD at SNF       The results of significant diagnostics from this hospitalization (including imaging, microbiology, ancillary and laboratory) are listed below for reference.    Significant Diagnostic Studies: Dg Chest 2 View  07/31/2015  CLINICAL DATA:  Shortness of breath and wheezing EXAM: CHEST  2 VIEW COMPARISON:  07/28/2015 FINDINGS: Cardiac shadow is stable. A dialysis catheter is again seen. Persistent right basilar infiltrate is seen. No new focal abnormality is noted. Small  amount of pleural fluid is noted on the right as well. IMPRESSION: Persistent right basilar infiltrate with small pleural effusion. Electronically Signed   By: Inez Catalina M.D.   On: 07/31/2015 13:19   Dg Chest Port 1 View  07/28/2015  CLINICAL DATA:  Acute onset of dyspnea.  Initial encounter. EXAM: PORTABLE CHEST 1 VIEW COMPARISON:  Chest radiograph performed 07/01/2015 FINDINGS: The lungs are well expanded. Right basilar airspace opacity raises concern for pneumonia. No pleural effusion or pneumothorax is seen. The cardiomediastinal silhouette is borderline normal in size. A right-sided dual-lumen catheter is noted ending about the distal SVC. No acute osseous abnormalities are seen. IMPRESSION: Right basilar airspace opacity is concerning for pneumonia. Electronically Signed   By: Garald Balding M.D.   On: 07/28/2015 21:47    Microbiology: Recent Results (from the past 240 hour(s))  Culture, blood (routine x 2)     Status: None (Preliminary result)   Collection Time: 07/28/15  9:35 PM  Result Value Ref Range Status   Specimen Description BLOOD LEFT ARM  Final   Special Requests BOTTLES DRAWN AEROBIC AND ANAEROBIC 10CC  Final   Culture NO GROWTH 4 DAYS  Final   Report Status PENDING  Incomplete  Culture, blood (routine x 2)     Status: None (Preliminary result)   Collection Time: 07/28/15 10:19 PM  Result Value Ref Range Status   Specimen Description BLOOD LEFT HAND  Final   Special Requests BOTTLES DRAWN AEROBIC AND ANAEROBIC 5CC  Final   Culture NO GROWTH 3 DAYS  Final   Report Status PENDING  Incomplete  MRSA PCR Screening     Status: Abnormal   Collection Time: 07/29/15  8:40 AM  Result Value Ref Range Status   MRSA by PCR POSITIVE (A) NEGATIVE Final    Comment:        The GeneXpert MRSA Assay (FDA approved for NASAL specimens only), is one component of a comprehensive MRSA colonization surveillance program. It is not intended to diagnose MRSA infection nor to guide  or monitor treatment for MRSA infections. RESULT CALLED TO, READ BACK BY AND VERIFIED WITH: AGUIRRE RN 10:15 07/29/15 ([email or web address])   Culture, sputum-assessment     Status: None   Collection Time: 07/29/15  8:56 AM  Result Value Ref Range Status   Specimen Description SPUTUM  Final   Special Requests NONE  Final   Sputum evaluation   Final    MICROSCOPIC FINDINGS SUGGEST THAT THIS SPECIMEN IS NOT REPRESENTATIVE OF LOWER RESPIRATORY SECRETIONS. PLEASE RECOLLECT. RESULT CALLED TO, READ BACK BY AND VERIFIED WITH: Netta Corrigan RN 11:00 07/29/15 ([email or web address])    Report Status 07/29/2015 FINAL  Final     Labs: Basic Metabolic Panel:  Recent Labs Lab 07/28/15 2135 07/29/15 0600 07/30/15 0012 08/01/15 1300  NA 130* 130* 135 133*  K 4.9 4.5 3.6 3.7  CL 90* 92* 97* 97*  CO2 27 23 24 27   GLUCOSE 157* 206* 227* 149*  BUN 57* 61* 30* 32*  CREATININE 3.73* 4.01* 2.42* 2.78*  CALCIUM 8.7* 8.4* 8.4* 8.2*  PHOS  --   --   --  2.3*   Liver Function Tests:  Recent Labs Lab 07/29/15 0600 08/01/15 1300  AST 22  --   ALT 19  --   ALKPHOS 60  --   BILITOT 0.8  --   PROT 6.2*  --   ALBUMIN 2.9* 2.4*   No results for input(s): LIPASE, AMYLASE in the last 168 hours. No results for input(s): AMMONIA in the last 168 hours. CBC:  Recent Labs Lab 07/29/15 0600 07/30/15 0012 07/31/15 0736 08/01/15 0817 08/01/15 1301  WBC 14.2* 15.0* 16.8* 15.0* 13.8*  NEUTROABS 13.5*  --  13.5*  --   --   HGB 12.0* 11.0* 10.6* 11.3* 10.3*  HCT 37.8* 34.1* 34.4* 35.9* 33.7*  MCV 94.3 93.2 93.2 95.2 94.4  PLT 228 206 213 173 184   Cardiac Enzymes:  Recent Labs Lab 07/28/15 2135 07/29/15 0841 07/29/15 2055  TROPONINI 0.14* <0.03 <0.03   BNP: BNP (last 3 results)  Recent Labs  05/13/15 2017 06/08/15 0016 07/28/15 2135  BNP 362.1* 892.3* 432.6*    ProBNP (last 3 results) No results for input(s): PROBNP in the last 8760 hours.  CBG:  Recent Labs Lab 08/01/15 0855 08/01/15 1159 08/01/15 1703  08/01/15 2122 08/02/15 0758  GLUCAP 128* 158* 94 177* 105*       Signed:  Louellen Molder MD.  [facility] 08/02/2015, 11:28 AM

## 2015-08-02 NOTE — Progress Notes (Signed)
Pharmacy Antibiotic Note  Terry [name] is a 81 y.o. male admitted on 07/28/2015 with pneumonia.  Pharmacy has been consulted for Vancomycin + Cefepime dosing.  Today is Vancomycin + Cefepime D#6 and the patient is planning to get back on a MWF HD schedule today. Discussed with Dr. Clementeen Graham and recommended a 7 day duration for HCAP. Today's doses will complete this duration - will discontinue antibiotics after given post-HD today.  Plan: 1. Vancomycin 750 mg x 1 dose post HD today 2. Cefepime 2g IV x 1 dose post HD today  3. Discontinue antibiotics after today's doses - will complete total duration of ~7 days for HCAP.  Height: 5\' 8"  (172.7 cm) Weight: 166 lb 3.6 oz (75.4 kg) IBW/kg (Calculated) : 68.4  Temp (24hrs), Avg:97.5 F (36.4 C), Min:97.1 F (36.2 C), Max:98.1 F (36.7 C)   Recent Labs Lab 07/28/15 2135 07/28/15 2156 07/29/15 0600 07/30/15 0012 07/31/15 0736 08/01/15 0817 08/01/15 1300 08/01/15 1301  WBC 16.1*  --  14.2* 15.0* 16.8* 15.0*  --  13.8*  CREATININE 3.73*  --  4.01* 2.42*  --   --  2.78*  --   LATICACIDVEN  --  2.13*  --   --   --   --   --   --     Estimated Creatinine Clearance: 21.2 mL/min (by C-G formula based on Cr of 2.78).    Allergies  Allergen Reactions  . Yellow Dyes (Non-Tartrazine) Other (See Comments)    Burns arms Cough medications (tussionex and benzonatate ok)  . Levaquin [Levofloxacin In D5w] Itching    Antimicrobials this admission: Augmentin PTA Vanc 2/5 >> Cefepime 2/5 >>  Dose adjustments this admission: n/a  Microbiology results: 2/5 BCx >> ngtd 2/6 MRSA PCR >> positive 2/6 RCx >> bad sample Other: HIV neg  Thank you for allowing pharmacy to be a part of this patient's care.  Alycia Rossetti, PharmD, BCPS Clinical Pharmacist Pager: [phone] 08/02/2015 11:17 AM

## 2015-08-02 NOTE — Progress Notes (Signed)
PT Cancellation Note  Patient Details Name: Terry [name] MRN: [id number] DOB: Apr 02, 1935   Cancelled Treatment:    Reason Eval/Treat Not Completed: Patient declined, no reason specified States "I don't want to work on anything, I'm going to dialysis then going to rehab." Will follow and progress as pt is willing.  Ellouise Newer 08/02/2015, 2:12 PM  Camille Bal Annetta, [place]

## 2015-08-03 LAB — CULTURE, BLOOD (ROUTINE X 2): CULTURE: NO GROWTH

## 2015-08-03 NOTE — Progress Notes (Signed)
Pt transferred to: [facility]  Anticipated date of transfer: 08/02/15   Actually DC'D at 1:38 AM on 08/03/15 Transported by: Ambulance ([facility]) Time Tentatively Scheduled for:  Was to be scheduled after late dialysis Family notified: Unknown due to early morning d/c by nursing Report called to facility per nursing note.  DC summary had been sent to facility earlier in the day.  No further CSW needs identified.   CSW signing off.  Kendell Bane, LCSW [phone]

## 2015-08-03 NOTE — Progress Notes (Signed)
Patient discharged with [facility] to [facility] @01 :42 am.

## 2015-08-05 DIAGNOSIS — N186 End stage renal disease: Secondary | ICD-10-CM | POA: Diagnosis not present

## 2015-08-05 DIAGNOSIS — J156 Pneumonia due to other aerobic Gram-negative bacteria: Secondary | ICD-10-CM | POA: Diagnosis not present

## 2015-08-05 DIAGNOSIS — J150 Pneumonia due to Klebsiella pneumoniae: Secondary | ICD-10-CM | POA: Diagnosis not present

## 2015-08-06 DIAGNOSIS — I10 Essential (primary) hypertension: Secondary | ICD-10-CM | POA: Diagnosis not present

## 2015-08-06 DIAGNOSIS — J449 Chronic obstructive pulmonary disease, unspecified: Secondary | ICD-10-CM | POA: Diagnosis not present

## 2015-08-06 DIAGNOSIS — J189 Pneumonia, unspecified organism: Secondary | ICD-10-CM | POA: Diagnosis not present

## 2015-08-06 DIAGNOSIS — J9600 Acute respiratory failure, unspecified whether with hypoxia or hypercapnia: Secondary | ICD-10-CM | POA: Diagnosis not present

## 2015-08-07 DIAGNOSIS — N186 End stage renal disease: Secondary | ICD-10-CM | POA: Diagnosis not present

## 2015-08-07 DIAGNOSIS — J156 Pneumonia due to other aerobic Gram-negative bacteria: Secondary | ICD-10-CM | POA: Diagnosis not present

## 2015-08-07 DIAGNOSIS — J150 Pneumonia due to Klebsiella pneumoniae: Secondary | ICD-10-CM | POA: Diagnosis not present

## 2015-08-08 DIAGNOSIS — D638 Anemia in other chronic diseases classified elsewhere: Secondary | ICD-10-CM | POA: Diagnosis not present

## 2015-08-08 DIAGNOSIS — I4891 Unspecified atrial fibrillation: Secondary | ICD-10-CM | POA: Diagnosis not present

## 2015-08-08 DIAGNOSIS — J449 Chronic obstructive pulmonary disease, unspecified: Secondary | ICD-10-CM | POA: Diagnosis not present

## 2015-08-08 DIAGNOSIS — J189 Pneumonia, unspecified organism: Secondary | ICD-10-CM | POA: Diagnosis not present

## 2015-08-09 ENCOUNTER — Other Ambulatory Visit: Payer: Medicare PPO

## 2015-08-09 DIAGNOSIS — J156 Pneumonia due to other aerobic Gram-negative bacteria: Secondary | ICD-10-CM | POA: Diagnosis not present

## 2015-08-09 DIAGNOSIS — N186 End stage renal disease: Secondary | ICD-10-CM | POA: Diagnosis not present

## 2015-08-09 DIAGNOSIS — J150 Pneumonia due to Klebsiella pneumoniae: Secondary | ICD-10-CM | POA: Diagnosis not present

## 2015-08-12 ENCOUNTER — Telehealth: Payer: Self-pay | Admitting: *Deleted

## 2015-08-12 DIAGNOSIS — N186 End stage renal disease: Secondary | ICD-10-CM | POA: Diagnosis not present

## 2015-08-12 DIAGNOSIS — J156 Pneumonia due to other aerobic Gram-negative bacteria: Secondary | ICD-10-CM | POA: Diagnosis not present

## 2015-08-12 DIAGNOSIS — J150 Pneumonia due to Klebsiella pneumoniae: Secondary | ICD-10-CM | POA: Diagnosis not present

## 2015-08-12 NOTE — Telephone Encounter (Signed)
Colletta Maryland called from [place] and left a message on my voicemail stating she has faxed paperwork to be completed for the pt and has not received this back.  I called Colletta Maryland and advised her I called her and left a message with Lilia Pro on 2/2 to let her know per Dr Maudie Mercury the paperwork cannot be completed as she has not seen the pt and she stated she was not aware of this and will not fax any additional forms.

## 2015-08-14 ENCOUNTER — Emergency Department (HOSPITAL_COMMUNITY): Payer: Medicare PPO

## 2015-08-14 ENCOUNTER — Encounter (HOSPITAL_COMMUNITY): Payer: Self-pay | Admitting: Emergency Medicine

## 2015-08-14 ENCOUNTER — Inpatient Hospital Stay (HOSPITAL_COMMUNITY)
Admission: EM | Admit: 2015-08-14 | Discharge: 2015-08-19 | DRG: 190 | Disposition: A | Discharge status: Home Health | Payer: Medicare PPO | Attending: Family Medicine | Admitting: Family Medicine

## 2015-08-14 DIAGNOSIS — Z9981 Dependence on supplemental oxygen: Secondary | ICD-10-CM

## 2015-08-14 DIAGNOSIS — I482 Chronic atrial fibrillation, unspecified: Secondary | ICD-10-CM | POA: Diagnosis present

## 2015-08-14 DIAGNOSIS — J9621 Acute and chronic respiratory failure with hypoxia: Secondary | ICD-10-CM | POA: Diagnosis present

## 2015-08-14 DIAGNOSIS — R0789 Other chest pain: Secondary | ICD-10-CM | POA: Diagnosis not present

## 2015-08-14 DIAGNOSIS — J150 Pneumonia due to Klebsiella pneumoniae: Secondary | ICD-10-CM | POA: Diagnosis not present

## 2015-08-14 DIAGNOSIS — J9601 Acute respiratory failure with hypoxia: Secondary | ICD-10-CM | POA: Diagnosis not present

## 2015-08-14 DIAGNOSIS — Z992 Dependence on renal dialysis: Secondary | ICD-10-CM | POA: Diagnosis not present

## 2015-08-14 DIAGNOSIS — R8271 Bacteriuria: Secondary | ICD-10-CM | POA: Diagnosis present

## 2015-08-14 DIAGNOSIS — J440 Chronic obstructive pulmonary disease with acute lower respiratory infection: Secondary | ICD-10-CM | POA: Diagnosis present

## 2015-08-14 DIAGNOSIS — Z881 Allergy status to other antibiotic agents status: Secondary | ICD-10-CM | POA: Diagnosis not present

## 2015-08-14 DIAGNOSIS — J9690 Respiratory failure, unspecified, unspecified whether with hypoxia or hypercapnia: Secondary | ICD-10-CM | POA: Diagnosis not present

## 2015-08-14 DIAGNOSIS — J449 Chronic obstructive pulmonary disease, unspecified: Secondary | ICD-10-CM | POA: Diagnosis not present

## 2015-08-14 DIAGNOSIS — L0390 Cellulitis, unspecified: Secondary | ICD-10-CM | POA: Diagnosis not present

## 2015-08-14 DIAGNOSIS — J441 Chronic obstructive pulmonary disease with (acute) exacerbation: Secondary | ICD-10-CM | POA: Diagnosis present

## 2015-08-14 DIAGNOSIS — E7800 Pure hypercholesterolemia, unspecified: Secondary | ICD-10-CM | POA: Diagnosis present

## 2015-08-14 DIAGNOSIS — D631 Anemia in chronic kidney disease: Secondary | ICD-10-CM | POA: Diagnosis not present

## 2015-08-14 DIAGNOSIS — N186 End stage renal disease: Secondary | ICD-10-CM | POA: Diagnosis present

## 2015-08-14 DIAGNOSIS — I5022 Chronic systolic (congestive) heart failure: Secondary | ICD-10-CM | POA: Diagnosis present

## 2015-08-14 DIAGNOSIS — B961 Klebsiella pneumoniae [K. pneumoniae] as the cause of diseases classified elsewhere: Secondary | ICD-10-CM | POA: Diagnosis present

## 2015-08-14 DIAGNOSIS — I5043 Acute on chronic combined systolic (congestive) and diastolic (congestive) heart failure: Secondary | ICD-10-CM

## 2015-08-14 DIAGNOSIS — Z7951 Long term (current) use of inhaled steroids: Secondary | ICD-10-CM | POA: Diagnosis not present

## 2015-08-14 DIAGNOSIS — E8889 Other specified metabolic disorders: Secondary | ICD-10-CM | POA: Diagnosis present

## 2015-08-14 DIAGNOSIS — Z955 Presence of coronary angioplasty implant and graft: Secondary | ICD-10-CM

## 2015-08-14 DIAGNOSIS — Z8673 Personal history of transient ischemic attack (TIA), and cerebral infarction without residual deficits: Secondary | ICD-10-CM | POA: Diagnosis not present

## 2015-08-14 DIAGNOSIS — R0602 Shortness of breath: Secondary | ICD-10-CM

## 2015-08-14 DIAGNOSIS — Z8701 Personal history of pneumonia (recurrent): Secondary | ICD-10-CM

## 2015-08-14 DIAGNOSIS — N2581 Secondary hyperparathyroidism of renal origin: Secondary | ICD-10-CM | POA: Diagnosis present

## 2015-08-14 DIAGNOSIS — I5031 Acute diastolic (congestive) heart failure: Secondary | ICD-10-CM | POA: Diagnosis not present

## 2015-08-14 DIAGNOSIS — I132 Hypertensive heart and chronic kidney disease with heart failure and with stage 5 chronic kidney disease, or end stage renal disease: Secondary | ICD-10-CM | POA: Diagnosis present

## 2015-08-14 DIAGNOSIS — E1122 Type 2 diabetes mellitus with diabetic chronic kidney disease: Secondary | ICD-10-CM | POA: Diagnosis present

## 2015-08-14 DIAGNOSIS — I2510 Atherosclerotic heart disease of native coronary artery without angina pectoris: Secondary | ICD-10-CM | POA: Diagnosis present

## 2015-08-14 DIAGNOSIS — I5033 Acute on chronic diastolic (congestive) heart failure: Secondary | ICD-10-CM | POA: Diagnosis present

## 2015-08-14 DIAGNOSIS — Z87891 Personal history of nicotine dependence: Secondary | ICD-10-CM | POA: Diagnosis not present

## 2015-08-14 DIAGNOSIS — I25700 Atherosclerosis of coronary artery bypass graft(s), unspecified, with unstable angina pectoris: Secondary | ICD-10-CM | POA: Diagnosis not present

## 2015-08-14 DIAGNOSIS — D638 Anemia in other chronic diseases classified elsewhere: Secondary | ICD-10-CM | POA: Diagnosis present

## 2015-08-14 DIAGNOSIS — B965 Pseudomonas (aeruginosa) (mallei) (pseudomallei) as the cause of diseases classified elsewhere: Secondary | ICD-10-CM | POA: Diagnosis present

## 2015-08-14 DIAGNOSIS — J156 Pneumonia due to other aerobic Gram-negative bacteria: Secondary | ICD-10-CM | POA: Diagnosis not present

## 2015-08-14 DIAGNOSIS — R05 Cough: Secondary | ICD-10-CM | POA: Diagnosis not present

## 2015-08-14 DIAGNOSIS — R069 Unspecified abnormalities of breathing: Secondary | ICD-10-CM | POA: Diagnosis not present

## 2015-08-14 DIAGNOSIS — I25810 Atherosclerosis of coronary artery bypass graft(s) without angina pectoris: Secondary | ICD-10-CM | POA: Diagnosis present

## 2015-08-14 DIAGNOSIS — K922 Gastrointestinal hemorrhage, unspecified: Secondary | ICD-10-CM | POA: Diagnosis not present

## 2015-08-14 DIAGNOSIS — I5032 Chronic diastolic (congestive) heart failure: Secondary | ICD-10-CM | POA: Diagnosis present

## 2015-08-14 DIAGNOSIS — M6281 Muscle weakness (generalized): Secondary | ICD-10-CM | POA: Diagnosis not present

## 2015-08-14 DIAGNOSIS — M1990 Unspecified osteoarthritis, unspecified site: Secondary | ICD-10-CM | POA: Diagnosis present

## 2015-08-14 DIAGNOSIS — I120 Hypertensive chronic kidney disease with stage 5 chronic kidney disease or end stage renal disease: Secondary | ICD-10-CM | POA: Diagnosis not present

## 2015-08-14 DIAGNOSIS — Z91041 Radiographic dye allergy status: Secondary | ICD-10-CM

## 2015-08-14 DIAGNOSIS — D500 Iron deficiency anemia secondary to blood loss (chronic): Secondary | ICD-10-CM | POA: Diagnosis not present

## 2015-08-14 DIAGNOSIS — R2681 Unsteadiness on feet: Secondary | ICD-10-CM | POA: Diagnosis not present

## 2015-08-14 LAB — CBC WITH DIFFERENTIAL/PLATELET
Basophils Absolute: 0 10*3/uL (ref 0.0–0.1)
Basophils Relative: 0 %
EOS PCT: 0 %
Eosinophils Absolute: 0 10*3/uL (ref 0.0–0.7)
HEMATOCRIT: 38.4 % — AB (ref 39.0–52.0)
Hemoglobin: 11.7 g/dL — ABNORMAL LOW (ref 13.0–17.0)
LYMPHS PCT: 6 %
Lymphs Abs: 0.4 10*3/uL — ABNORMAL LOW (ref 0.7–4.0)
MCH: 28 pg (ref 26.0–34.0)
MCHC: 30.5 g/dL (ref 30.0–36.0)
MCV: 91.9 fL (ref 78.0–100.0)
MONO ABS: 0.1 10*3/uL (ref 0.1–1.0)
MONOS PCT: 2 %
NEUTROS ABS: 6.5 10*3/uL (ref 1.7–7.7)
Neutrophils Relative %: 92 %
PLATELETS: 276 10*3/uL (ref 150–400)
RBC: 4.18 MIL/uL — ABNORMAL LOW (ref 4.22–5.81)
RDW: 18.6 % — AB (ref 11.5–15.5)
WBC: 7 10*3/uL (ref 4.0–10.5)

## 2015-08-14 LAB — COMPREHENSIVE METABOLIC PANEL
ALK PHOS: 63 U/L (ref 38–126)
ALT: 14 U/L — AB (ref 17–63)
AST: 39 U/L (ref 15–41)
Albumin: 2.9 g/dL — ABNORMAL LOW (ref 3.5–5.0)
Anion gap: 14 (ref 5–15)
BUN: 11 mg/dL (ref 6–20)
CALCIUM: 8.8 mg/dL — AB (ref 8.9–10.3)
CHLORIDE: 92 mmol/L — AB (ref 101–111)
CO2: 26 mmol/L (ref 22–32)
CREATININE: 2.68 mg/dL — AB (ref 0.61–1.24)
GFR, EST AFRICAN AMERICAN: 25 mL/min — AB (ref >60)
GFR, EST NON AFRICAN AMERICAN: 21 mL/min — AB (ref >60)
Glucose, Bld: 239 mg/dL — ABNORMAL HIGH (ref 65–99)
Potassium: 4.5 mmol/L (ref 3.5–5.1)
Sodium: 132 mmol/L — ABNORMAL LOW (ref 135–145)
Total Bilirubin: 1 mg/dL (ref 0.3–1.2)
Total Protein: 7.5 g/dL (ref 6.5–8.1)

## 2015-08-14 LAB — I-STAT CG4 LACTIC ACID, ED: Lactic Acid, Venous: 2.03 mmol/L (ref 0.5–2.0)

## 2015-08-14 NOTE — ED Provider Notes (Signed)
CSN: [id number]     Arrival date & time 08/14/15  2236 History   By signing my name below, I, Forrestine Him, attest that this documentation has been prepared under the direction and in the presence of Julianne Rice, MD.  Electronically Signed: Forrestine Him, ED Scribe. 08/14/2015. 11:40 PM.   Chief Complaint  Patient presents with  . Shortness of Breath  . Cough   The history is provided by the patient. No language interpreter was used.    HPI Comments: CASWELL KALEN brought in by EMS from [place] is a 81 y.o. male with s PMHx of stroke, high cholesterol, HTN, COPD, CAD, and A-Fib who presents to the Emergency Department complaining of constant, ongoing, worsening shortness of breath x 1 day. Pt also reports intermittent, ongoing productive cough. No aggravating or alleviating factors at this time. At home breathing treatments attempted without any improvement. No recent fever or chills. Last dialysis treatment earlier this afternoon without any issues. Pt was last seen in the Emergency Department on 2/5 for shorntness of breath. At that time, pt was admitted to the hospital for PNA. However, pt states "this PNA has been ongoing since October of last year". Pt was started on antibiotics during his recent hospital stay but is not currently taking any at this time.  PCP: [name], DO    Past Medical History  Diagnosis Date  . Stroke ([place]) 2012  . High cholesterol   . Hypertension   . Kidney disease     CKD4, sees [place] [facility], considering dialysis  . COPD (chronic obstructive pulmonary disease) ([place])   . CAD (coronary artery disease) [place], [name]  . Acute and chronic respiratory failure 10/22/2012  . Osteoarthritis 10/22/2012  . Pneumonia April 2014  . Atrial fibrillation ([place])   . Shortness of breath dyspnea    Past Surgical History  Procedure Laterality Date  . Coronary stent placement  1999    2 stents  . Appendectomy  1984  . Colon surgery  2012    partial  colon removed - twisted bowel  . Hernia repair      Right inguinal  . Cystoscopy/retrograde/ureteroscopy Bilateral 04/23/2014    Procedure: CYSTOSCOPY BILATERAL RETROGRADE,  LEFT URETEROSCOPY, LEFT STENT PLACEMENT;  Surgeon: Raynelle Bring, MD;  Location: [facility];  Service: Urology;  Laterality: Bilateral;  . Av fistula placement Right 05/21/2015    Procedure: Right Arm Brachiocephalic ARTERIOVENOUS (AV) FISTULA CREATION;  Surgeon: Angelia Mould, MD;  Location: [facility] OR;  Service: Vascular;  Laterality: Right;   Family History  Problem Relation Age of Onset  . Cancer Mother     Breast  . Heart disease Father   . Heart attack Father   . Parkinson's disease Brother    Social History  Substance Use Topics  . Smoking status: Former Smoker -- 1.00 packs/day for 49 years    Types: Cigarettes, Pipe, Cigars    Quit date: 02/21/2000  . Smokeless tobacco: Never Used  . Alcohol Use: No    Review of Systems  Constitutional: Negative for fever and chills.  HENT: Negative for congestion, sinus pressure and sore throat.   Respiratory: Positive for cough and shortness of breath. Negative for chest tightness.   Cardiovascular: Negative for chest pain and leg swelling.  Gastrointestinal: Negative for nausea, vomiting, abdominal pain, diarrhea and constipation.  Genitourinary: Negative for dysuria.  Musculoskeletal: Negative for back pain, neck pain and neck stiffness.  Skin: Negative for rash and wound.  Neurological: Negative for dizziness, weakness, light-headedness, numbness and headaches.  Psychiatric/Behavioral: Negative for confusion.  All other systems reviewed and are negative.     Allergies  Yellow dyes (non-tartrazine); Levaquin; and Tussionex pennkinetic er  Home Medications   Prior to Admission medications   Medication Sig Start Date End Date Taking? Authorizing Provider  albuterol (VENTOLIN HFA) 108 (90 BASE) MCG/ACT inhaler Inhale 2 puffs into the lungs every 4 (four)  hours as needed for wheezing or shortness of breath. 05/09/15  Yes [name], DO  amLODipine (NORVASC) 10 MG tablet Take 1 tablet (10 mg total) by mouth daily. 06/06/15  Yes Burnell Blanks, MD  atorvastatin (LIPITOR) 20 MG tablet Take 1 tablet (20 mg total) by mouth daily. 03/25/15  Yes Burnell Blanks, MD  benzonatate (TESSALON) 200 MG capsule Take 1 capsule (200 mg total) by mouth 3 (three) times daily as needed for cough. 06/04/15  Yes Ripudeep Krystal Eaton, MD  budesonide-formoterol (SYMBICORT) 160-4.5 MCG/ACT inhaler Inhale 2 puffs into the lungs 2 (two) times daily. 11/07/14  Yes Tanda Rockers, MD  cholecalciferol (VITAMIN D) 1000 units tablet Take 1,000 Units by mouth daily.   Yes Historical Provider, MD  guaiFENesin-dextromethorphan (ROBITUSSIN DM) 100-10 MG/5ML syrup Take 5 mLs by mouth every 4 (four) hours as needed for cough. 06/04/15  Yes Ripudeep Krystal Eaton, MD  HYDROcodone-homatropine (HYCODAN) 5-1.5 MG/5ML syrup Take 5 mLs by mouth every 6 (six) hours as needed for cough. 08/02/15  Yes Nishant Dhungel, MD  ipratropium-albuterol (DUONEB) 0.5-2.5 (3) MG/3ML SOLN Take 3 mLs by nebulization 3 (three) times daily. 06/04/15  Yes Ripudeep Krystal Eaton, MD  methocarbamol (ROBAXIN) 500 MG tablet Take 1 tablet (500 mg total) by mouth every 8 (eight) hours as needed for muscle spasms. 08/02/15  Yes Nishant Dhungel, MD  metoprolol tartrate (LOPRESSOR) 25 MG tablet Take 2 tablets (50 mg total) by mouth 2 (two) times daily. 8am, 5pm 08/02/15  Yes Nishant Dhungel, MD  Nutritional Supplements (NEPRO) LIQD Take 1 Can by mouth 2 (two) times daily before lunch and supper.   Yes Historical Provider, MD  pantoprazole (PROTONIX) 40 MG tablet Take 1 tablet (40 mg total) by mouth 2 (two) times daily before a meal. 07/04/15  Yes Debbe Odea, MD  polyethylene glycol (MIRALAX) packet Take 17 g by mouth daily as needed for moderate constipation. 06/04/15  Yes Ripudeep Krystal Eaton, MD  senna-docusate (SENOKOT S) 8.6-50 MG  tablet Take 2 tablets by mouth at bedtime as needed for mild constipation. 06/04/15  Yes Ripudeep Krystal Eaton, MD  sevelamer carbonate (RENVELA) 800 MG tablet Take 1 tablet (800 mg total) by mouth 3 (three) times daily with meals. 07/04/15  Yes Debbe Odea, MD  tiotropium (SPIRIVA HANDIHALER) 18 MCG inhalation capsule INHALE THE CONTENTS OF ONE CAPSULE INTO LUNGS ONCE DAILY Patient taking differently: Place 18 mcg into inhaler and inhale daily.  04/25/15  Yes Tanda Rockers, MD    Triage Vitals: BP 103/45 mmHg  Pulse 91  Temp(Src) 98.1 F (36.7 C) (Oral)  Resp 23  Ht 5\' 7"  (1.702 m)  Wt 172 lb (78.019 kg)  BMI 26.93 kg/m2  SpO2 89%   Physical Exam  Constitutional: He is oriented to person, place, and time. He appears well-developed and well-nourished. No distress.  HENT:  Head: Normocephalic and atraumatic.  Mouth/Throat: Oropharynx is clear and moist. No oropharyngeal exudate.  Eyes: EOM are normal. Pupils are equal, round, and reactive to light.  Neck: Normal range of motion. Neck  supple. No JVD present.  Cardiovascular: Normal rate and regular rhythm.   Pulmonary/Chest: Effort normal. No respiratory distress. He has wheezes. He has no rales. He exhibits no tenderness.  Scattered rhonchi. Few expiratory wheezes  Abdominal: Soft. Bowel sounds are normal. He exhibits no distension and no mass. There is no tenderness. There is no rebound and no guarding.  Musculoskeletal: Normal range of motion. He exhibits no edema or tenderness.  No lower extremity edema or asymmetry.  Neurological: He is alert and oriented to person, place, and time.  Moves all extremities without deficit. Sensation is fully intact.  Skin: Skin is warm and dry. No rash noted. No erythema.  Psychiatric: He has a normal mood and affect. His behavior is normal.  Nursing note and vitals reviewed.   ED Course  Procedures (including critical care time)  DIAGNOSTIC STUDIES: Oxygen Saturation is 93% on RA, adequate by my  interpretation.    COORDINATION OF CARE: 11:13 PM- Will order CXR, CMP, I-stat CG4 lactic acid, CBC, and urinalysis. Discussed treatment plan with pt at bedside and pt agreed to plan.     Labs Review Labs Reviewed  COMPREHENSIVE METABOLIC PANEL - Abnormal; Notable for the following:    Sodium 132 (*)    Chloride 92 (*)    Glucose, Bld 239 (*)    Creatinine, Ser 2.68 (*)    Calcium 8.8 (*)    Albumin 2.9 (*)    ALT 14 (*)    GFR calc non Af Amer 21 (*)    GFR calc Af Amer 25 (*)    All other components within normal limits  CBC WITH DIFFERENTIAL/PLATELET - Abnormal; Notable for the following:    RBC 4.18 (*)    Hemoglobin 11.7 (*)    HCT 38.4 (*)    RDW 18.6 (*)    Lymphs Abs 0.4 (*)    All other components within normal limits  I-STAT CG4 LACTIC ACID, ED - Abnormal; Notable for the following:    Lactic Acid, Venous 2.03 (*)    All other components within normal limits  I-STAT CG4 LACTIC ACID, ED - Abnormal; Notable for the following:    Lactic Acid, Venous 2.42 (*)    All other components within normal limits  CULTURE, BLOOD (ROUTINE X 2)  CULTURE, BLOOD (ROUTINE X 2)  URINE CULTURE  URINALYSIS, ROUTINE W REFLEX MICROSCOPIC (NOT AT [facility])    Imaging Review Dg Chest 2 View  08/14/2015  CLINICAL DATA:  Shortness of breath and cough for 1 day. EXAM: CHEST  2 VIEW COMPARISON:  07/31/2015 FINDINGS: Hyperexpansion is consistent with emphysema. Stable bibasilar linear densities, likely atelectatic although superimposed pneumonia not entirely excluded. . No focal airspace consolidation or pulmonary edema. No pleural effusion. The cardio pericardial silhouette is enlarged. Right IJ catheter tip projects at the SVC/ RA junction. The visualized bony structures of the thorax are intact. IMPRESSION: Stable exam.  Emphysema with cardiomegaly and bibasilar atelectasis. Electronically Signed   By: Misty Stanley M.D.   On: 08/14/2015 23:58   I have personally reviewed and evaluated these  images and lab results as part of my medical decision-making.   EKG Interpretation   Date/Time:  Thursday August 15 2015 01:00:23 EST Ventricular Rate:  89 PR Interval:    QRS Duration: 91 QT Interval:  355 QTC Calculation: 432 R Axis:   35 Text Interpretation:  Atrial fibrillation Low voltage, extremity leads  Confirmed by Lita Mains  MD, Bhavya Grand ([id number]) on 08/15/2015 2:31:39 AM  MDM   Final diagnoses:  COPD exacerbation ([place])  Atypical chest pain    I personally performed the services described in this documentation, which was scribed in my presence. The recorded information has been reviewed and is accurate.   Patient with atypical chest pain. Troponin 2 is normal. Chronic atrial fibrillation without evidence of ischemia. Patient does have some decreased air movement in his bases. He is on 2 L of oxygen at home. Desaturates into the 70s with exertion. Some improvement with nebulized treatment. Given IM Solu-Medrol in the facility prior to arrival. Discussed with [facility] hospitalist, Dr. Hal Hope. Will admit to telemetry bed.  Julianne Rice, MD 08/15/15 [phone]

## 2015-08-14 NOTE — ED Notes (Signed)
Pt in EMS from [facility]. Pt recently DC, dx with pneumonia. Pt started C/O cough today along with some SOB. Also has hx COPD. Audible wheezing noted

## 2015-08-15 ENCOUNTER — Encounter (HOSPITAL_COMMUNITY): Payer: Self-pay | Admitting: Internal Medicine

## 2015-08-15 ENCOUNTER — Inpatient Hospital Stay (HOSPITAL_COMMUNITY): Payer: Medicare PPO

## 2015-08-15 DIAGNOSIS — J9621 Acute and chronic respiratory failure with hypoxia: Secondary | ICD-10-CM | POA: Diagnosis present

## 2015-08-15 DIAGNOSIS — E8889 Other specified metabolic disorders: Secondary | ICD-10-CM | POA: Diagnosis present

## 2015-08-15 DIAGNOSIS — I482 Chronic atrial fibrillation: Secondary | ICD-10-CM | POA: Diagnosis present

## 2015-08-15 DIAGNOSIS — I5022 Chronic systolic (congestive) heart failure: Secondary | ICD-10-CM | POA: Diagnosis present

## 2015-08-15 DIAGNOSIS — I2510 Atherosclerotic heart disease of native coronary artery without angina pectoris: Secondary | ICD-10-CM | POA: Diagnosis present

## 2015-08-15 DIAGNOSIS — Z9981 Dependence on supplemental oxygen: Secondary | ICD-10-CM | POA: Diagnosis not present

## 2015-08-15 DIAGNOSIS — J9601 Acute respiratory failure with hypoxia: Secondary | ICD-10-CM | POA: Diagnosis not present

## 2015-08-15 DIAGNOSIS — Z992 Dependence on renal dialysis: Secondary | ICD-10-CM | POA: Diagnosis not present

## 2015-08-15 DIAGNOSIS — E1122 Type 2 diabetes mellitus with diabetic chronic kidney disease: Secondary | ICD-10-CM | POA: Diagnosis present

## 2015-08-15 DIAGNOSIS — Z8673 Personal history of transient ischemic attack (TIA), and cerebral infarction without residual deficits: Secondary | ICD-10-CM | POA: Diagnosis not present

## 2015-08-15 DIAGNOSIS — B965 Pseudomonas (aeruginosa) (mallei) (pseudomallei) as the cause of diseases classified elsewhere: Secondary | ICD-10-CM | POA: Diagnosis present

## 2015-08-15 DIAGNOSIS — M1990 Unspecified osteoarthritis, unspecified site: Secondary | ICD-10-CM | POA: Diagnosis present

## 2015-08-15 DIAGNOSIS — Z955 Presence of coronary angioplasty implant and graft: Secondary | ICD-10-CM | POA: Diagnosis not present

## 2015-08-15 DIAGNOSIS — Z87891 Personal history of nicotine dependence: Secondary | ICD-10-CM | POA: Diagnosis not present

## 2015-08-15 DIAGNOSIS — I132 Hypertensive heart and chronic kidney disease with heart failure and with stage 5 chronic kidney disease, or end stage renal disease: Secondary | ICD-10-CM | POA: Diagnosis present

## 2015-08-15 DIAGNOSIS — B961 Klebsiella pneumoniae [K. pneumoniae] as the cause of diseases classified elsewhere: Secondary | ICD-10-CM | POA: Diagnosis present

## 2015-08-15 DIAGNOSIS — Z8701 Personal history of pneumonia (recurrent): Secondary | ICD-10-CM | POA: Diagnosis not present

## 2015-08-15 DIAGNOSIS — Z881 Allergy status to other antibiotic agents status: Secondary | ICD-10-CM | POA: Diagnosis not present

## 2015-08-15 DIAGNOSIS — N2581 Secondary hyperparathyroidism of renal origin: Secondary | ICD-10-CM | POA: Diagnosis present

## 2015-08-15 DIAGNOSIS — I25700 Atherosclerosis of coronary artery bypass graft(s), unspecified, with unstable angina pectoris: Secondary | ICD-10-CM | POA: Diagnosis not present

## 2015-08-15 DIAGNOSIS — N186 End stage renal disease: Secondary | ICD-10-CM | POA: Diagnosis present

## 2015-08-15 DIAGNOSIS — D638 Anemia in other chronic diseases classified elsewhere: Secondary | ICD-10-CM | POA: Diagnosis present

## 2015-08-15 DIAGNOSIS — J441 Chronic obstructive pulmonary disease with (acute) exacerbation: Secondary | ICD-10-CM | POA: Diagnosis present

## 2015-08-15 DIAGNOSIS — R8271 Bacteriuria: Secondary | ICD-10-CM | POA: Diagnosis present

## 2015-08-15 DIAGNOSIS — J440 Chronic obstructive pulmonary disease with acute lower respiratory infection: Secondary | ICD-10-CM | POA: Diagnosis present

## 2015-08-15 DIAGNOSIS — E7800 Pure hypercholesterolemia, unspecified: Secondary | ICD-10-CM | POA: Diagnosis present

## 2015-08-15 DIAGNOSIS — R0602 Shortness of breath: Secondary | ICD-10-CM | POA: Diagnosis present

## 2015-08-15 DIAGNOSIS — Z7951 Long term (current) use of inhaled steroids: Secondary | ICD-10-CM | POA: Diagnosis not present

## 2015-08-15 DIAGNOSIS — Z91041 Radiographic dye allergy status: Secondary | ICD-10-CM | POA: Diagnosis not present

## 2015-08-15 DIAGNOSIS — I5032 Chronic diastolic (congestive) heart failure: Secondary | ICD-10-CM | POA: Diagnosis present

## 2015-08-15 LAB — CBC WITH DIFFERENTIAL/PLATELET
BASOS ABS: 0 10*3/uL (ref 0.0–0.1)
BASOS PCT: 0 %
EOS ABS: 0 10*3/uL (ref 0.0–0.7)
Eosinophils Relative: 0 %
HCT: 36.3 % — ABNORMAL LOW (ref 39.0–52.0)
Hemoglobin: 11 g/dL — ABNORMAL LOW (ref 13.0–17.0)
Lymphocytes Relative: 7 %
Lymphs Abs: 0.5 10*3/uL — ABNORMAL LOW (ref 0.7–4.0)
MCH: 27.9 pg (ref 26.0–34.0)
MCHC: 30.3 g/dL (ref 30.0–36.0)
MCV: 92.1 fL (ref 78.0–100.0)
MONO ABS: 0.5 10*3/uL (ref 0.1–1.0)
MONOS PCT: 7 %
NEUTROS ABS: 5.9 10*3/uL (ref 1.7–7.7)
NEUTROS PCT: 86 %
Platelets: 264 10*3/uL (ref 150–400)
RBC: 3.94 MIL/uL — ABNORMAL LOW (ref 4.22–5.81)
RDW: 18.5 % — AB (ref 11.5–15.5)
WBC: 6.8 10*3/uL (ref 4.0–10.5)

## 2015-08-15 LAB — TROPONIN I: Troponin I: 0.03 ng/mL (ref <0.031)

## 2015-08-15 LAB — COMPREHENSIVE METABOLIC PANEL
ALK PHOS: 59 U/L (ref 38–126)
ALT: 14 U/L — AB (ref 17–63)
ANION GAP: 18 — AB (ref 5–15)
AST: 36 U/L (ref 15–41)
Albumin: 2.8 g/dL — ABNORMAL LOW (ref 3.5–5.0)
BILIRUBIN TOTAL: 0.2 mg/dL — AB (ref 0.3–1.2)
BUN: 20 mg/dL (ref 6–20)
CALCIUM: 8.9 mg/dL (ref 8.9–10.3)
CO2: 23 mmol/L (ref 22–32)
CREATININE: 3.76 mg/dL — AB (ref 0.61–1.24)
Chloride: 92 mmol/L — ABNORMAL LOW (ref 101–111)
GFR calc non Af Amer: 14 mL/min — ABNORMAL LOW (ref >60)
GFR, EST AFRICAN AMERICAN: 16 mL/min — AB (ref >60)
GLUCOSE: 242 mg/dL — AB (ref 65–99)
Potassium: 3.7 mmol/L (ref 3.5–5.1)
SODIUM: 133 mmol/L — AB (ref 135–145)
TOTAL PROTEIN: 7.2 g/dL (ref 6.5–8.1)

## 2015-08-15 LAB — GLUCOSE, CAPILLARY: Glucose-Capillary: 134 mg/dL — ABNORMAL HIGH (ref 65–99)

## 2015-08-15 LAB — EXPECTORATED SPUTUM ASSESSMENT W GRAM STAIN, RFLX TO RESP C

## 2015-08-15 LAB — EXPECTORATED SPUTUM ASSESSMENT W REFEX TO RESP CULTURE: SPECIAL REQUESTS: NORMAL

## 2015-08-15 LAB — TSH: TSH: 2.383 u[IU]/mL (ref 0.350–4.500)

## 2015-08-15 LAB — I-STAT CG4 LACTIC ACID, ED: LACTIC ACID, VENOUS: 2.42 mmol/L — AB (ref 0.5–2.0)

## 2015-08-15 MED ORDER — PANTOPRAZOLE SODIUM 40 MG PO TBEC
40.0000 mg | DELAYED_RELEASE_TABLET | Freq: Two times a day (BID) | ORAL | Status: DC
  Administered 2015-08-15 – 2015-08-19 (×7): 40 mg via ORAL
  Filled 2015-08-15 (×7): qty 1

## 2015-08-15 MED ORDER — HYDROCODONE-HOMATROPINE 5-1.5 MG/5ML PO SYRP: 5.0000 mL | ORAL_SOLUTION | Freq: Four times a day (QID) | ORAL | Status: DC | PRN

## 2015-08-15 MED ORDER — ALBUTEROL SULFATE (2.5 MG/3ML) 0.083% IN NEBU: 2.5000 mg | INHALATION_SOLUTION | RESPIRATORY_TRACT | Status: DC | PRN

## 2015-08-15 MED ORDER — NEPRO PO LIQD: 1.0000 | Freq: Two times a day (BID) | ORAL | Status: DC

## 2015-08-15 MED ORDER — METHOCARBAMOL 500 MG PO TABS
500.0000 mg | ORAL_TABLET | Freq: Three times a day (TID) | ORAL | Status: DC | PRN
  Administered 2015-08-16 – 2015-08-19 (×2): 500 mg via ORAL
  Filled 2015-08-15 (×2): qty 1

## 2015-08-15 MED ORDER — GUAIFENESIN ER 600 MG PO TB12
1200.0000 mg | ORAL_TABLET | Freq: Two times a day (BID) | ORAL | Status: DC
  Administered 2015-08-15 – 2015-08-19 (×9): 1200 mg via ORAL
  Filled 2015-08-15 (×9): qty 2

## 2015-08-15 MED ORDER — BENZONATATE 100 MG PO CAPS
200.0000 mg | ORAL_CAPSULE | Freq: Three times a day (TID) | ORAL | Status: DC | PRN
  Administered 2015-08-16 (×2): 200 mg via ORAL
  Filled 2015-08-15 (×2): qty 2

## 2015-08-15 MED ORDER — IPRATROPIUM BROMIDE 0.02 % IN SOLN
0.5000 mg | Freq: Four times a day (QID) | RESPIRATORY_TRACT | Status: DC
  Administered 2015-08-15 – 2015-08-16 (×4): 0.5 mg via RESPIRATORY_TRACT
  Filled 2015-08-15 (×6): qty 2.5

## 2015-08-15 MED ORDER — LEVALBUTEROL HCL 0.63 MG/3ML IN NEBU
0.6300 mg | INHALATION_SOLUTION | RESPIRATORY_TRACT | Status: DC | PRN
  Administered 2015-08-16: 0.63 mg via RESPIRATORY_TRACT
  Filled 2015-08-15 (×3): qty 3

## 2015-08-15 MED ORDER — ATORVASTATIN CALCIUM 20 MG PO TABS
20.0000 mg | ORAL_TABLET | Freq: Every day | ORAL | Status: DC
  Administered 2015-08-15 – 2015-08-19 (×5): 20 mg via ORAL
  Filled 2015-08-15: qty 1
  Filled 2015-08-15: qty 2
  Filled 2015-08-15 (×3): qty 1

## 2015-08-15 MED ORDER — ALBUTEROL SULFATE (2.5 MG/3ML) 0.083% IN NEBU: 2.5000 mg | INHALATION_SOLUTION | RESPIRATORY_TRACT | Status: DC

## 2015-08-15 MED ORDER — VITAMIN D 1000 UNITS PO TABS
1000.0000 [IU] | ORAL_TABLET | Freq: Every day | ORAL | Status: DC
  Administered 2015-08-15 – 2015-08-19 (×4): 1000 [IU] via ORAL
  Filled 2015-08-15 (×5): qty 1

## 2015-08-15 MED ORDER — ONDANSETRON HCL 4 MG/2ML IJ SOLN: 4.0000 mg | Freq: Four times a day (QID) | INTRAMUSCULAR | Status: DC | PRN

## 2015-08-15 MED ORDER — TECHNETIUM TC 99M DIETHYLENETRIAME-PENTAACETIC ACID: 30.0000 | Freq: Once | INTRAVENOUS | Status: DC | PRN

## 2015-08-15 MED ORDER — POLYETHYLENE GLYCOL 3350 17 G PO PACK
17.0000 g | PACK | Freq: Every day | ORAL | Status: DC | PRN
  Filled 2015-08-15: qty 1

## 2015-08-15 MED ORDER — GUAIFENESIN-DM 100-10 MG/5ML PO SYRP: 5.0000 mL | ORAL_SOLUTION | ORAL | Status: DC | PRN

## 2015-08-15 MED ORDER — TECHNETIUM TO 99M ALBUMIN AGGREGATED
4.0000 | Freq: Once | INTRAVENOUS | Status: AC | PRN
  Administered 2015-08-15: 4 via INTRAVENOUS

## 2015-08-15 MED ORDER — BUDESONIDE 0.5 MG/2ML IN SUSP: 0.2500 mg | Freq: Two times a day (BID) | RESPIRATORY_TRACT | Status: DC

## 2015-08-15 MED ORDER — ACETAMINOPHEN 325 MG PO TABS: 650.0000 mg | ORAL_TABLET | Freq: Four times a day (QID) | ORAL | Status: DC | PRN

## 2015-08-15 MED ORDER — SODIUM CHLORIDE 0.9% FLUSH
3.0000 mL | Freq: Two times a day (BID) | INTRAVENOUS | Status: DC
  Administered 2015-08-15 – 2015-08-19 (×5): 3 mL via INTRAVENOUS

## 2015-08-15 MED ORDER — HEPARIN SODIUM (PORCINE) 5000 UNIT/ML IJ SOLN
5000.0000 [IU] | Freq: Three times a day (TID) | INTRAMUSCULAR | Status: DC
  Administered 2015-08-15 – 2015-08-19 (×13): 5000 [IU] via SUBCUTANEOUS
  Filled 2015-08-15 (×13): qty 1

## 2015-08-15 MED ORDER — METHYLPREDNISOLONE SODIUM SUCC 125 MG IJ SOLR
60.0000 mg | Freq: Four times a day (QID) | INTRAMUSCULAR | Status: DC
  Administered 2015-08-15 – 2015-08-18 (×11): 60 mg via INTRAVENOUS
  Filled 2015-08-15 (×11): qty 2

## 2015-08-15 MED ORDER — NEPRO/CARBSTEADY PO LIQD
237.0000 mL | Freq: Two times a day (BID) | ORAL | Status: DC
  Administered 2015-08-15 – 2015-08-17 (×4): 237 mL via ORAL
  Filled 2015-08-15 (×2): qty 237

## 2015-08-15 MED ORDER — SEVELAMER CARBONATE 800 MG PO TABS
800.0000 mg | ORAL_TABLET | Freq: Three times a day (TID) | ORAL | Status: DC
  Administered 2015-08-15 (×2): 800 mg via ORAL
  Filled 2015-08-15 (×3): qty 1

## 2015-08-15 MED ORDER — ALBUTEROL (5 MG/ML) CONTINUOUS INHALATION SOLN
10.0000 mg/h | INHALATION_SOLUTION | RESPIRATORY_TRACT | Status: DC
  Administered 2015-08-15: 10 mg/h via RESPIRATORY_TRACT
  Filled 2015-08-15: qty 20

## 2015-08-15 MED ORDER — IPRATROPIUM BROMIDE 0.02 % IN SOLN: 0.5000 mg | RESPIRATORY_TRACT | Status: DC

## 2015-08-15 MED ORDER — METOPROLOL TARTRATE 50 MG PO TABS
50.0000 mg | ORAL_TABLET | Freq: Two times a day (BID) | ORAL | Status: DC
  Administered 2015-08-15 – 2015-08-19 (×6): 50 mg via ORAL
  Filled 2015-08-15 (×7): qty 1

## 2015-08-15 MED ORDER — ACETAMINOPHEN 650 MG RE SUPP: 650.0000 mg | Freq: Four times a day (QID) | RECTAL | Status: DC | PRN

## 2015-08-15 MED ORDER — DEXTROSE 5 % IV SOLN
500.0000 mg | INTRAVENOUS | Status: DC
  Administered 2015-08-15 – 2015-08-17 (×3): 500 mg via INTRAVENOUS
  Filled 2015-08-15 (×3): qty 500

## 2015-08-15 MED ORDER — ALBUTEROL SULFATE (2.5 MG/3ML) 0.083% IN NEBU
5.0000 mg | INHALATION_SOLUTION | Freq: Once | RESPIRATORY_TRACT | Status: AC
  Administered 2015-08-15: 5 mg via RESPIRATORY_TRACT
  Filled 2015-08-15: qty 6

## 2015-08-15 MED ORDER — LEVALBUTEROL HCL 0.63 MG/3ML IN NEBU: 0.6300 mg | INHALATION_SOLUTION | Freq: Four times a day (QID) | RESPIRATORY_TRACT | Status: DC | PRN

## 2015-08-15 MED ORDER — HYDROCODONE-HOMATROPINE 5-1.5 MG/5ML PO SYRP
5.0000 mL | ORAL_SOLUTION | Freq: Four times a day (QID) | ORAL | Status: DC | PRN
  Administered 2015-08-16 (×2): 5 mL via ORAL
  Filled 2015-08-15 (×3): qty 5

## 2015-08-15 MED ORDER — DOXYCYCLINE HYCLATE 100 MG IV SOLR: 100.0000 mg | Freq: Two times a day (BID) | INTRAVENOUS | Status: DC

## 2015-08-15 MED ORDER — AMLODIPINE BESYLATE 10 MG PO TABS: 10.0000 mg | ORAL_TABLET | Freq: Every day | ORAL | Status: DC

## 2015-08-15 MED ORDER — INSULIN ASPART 100 UNIT/ML ~~LOC~~ SOLN
0.0000 [IU] | Freq: Three times a day (TID) | SUBCUTANEOUS | Status: DC
Start: 2015-08-15 — End: 2015-08-19
  Administered 2015-08-15: 1 [IU] via SUBCUTANEOUS
  Administered 2015-08-16: 2 [IU] via SUBCUTANEOUS
  Administered 2015-08-16: 1 [IU] via SUBCUTANEOUS
  Administered 2015-08-17: 3 [IU] via SUBCUTANEOUS
  Administered 2015-08-17 (×2): 2 [IU] via SUBCUTANEOUS
  Administered 2015-08-18: 3 [IU] via SUBCUTANEOUS
  Administered 2015-08-18: 2 [IU] via SUBCUTANEOUS
  Administered 2015-08-19: 1 [IU] via SUBCUTANEOUS

## 2015-08-15 MED ORDER — SENNOSIDES-DOCUSATE SODIUM 8.6-50 MG PO TABS
2.0000 | ORAL_TABLET | Freq: Every evening | ORAL | Status: DC | PRN
  Filled 2015-08-15: qty 2

## 2015-08-15 MED ORDER — ONDANSETRON HCL 4 MG PO TABS
4.0000 mg | ORAL_TABLET | Freq: Four times a day (QID) | ORAL | Status: DC | PRN
Start: 2015-08-15 — End: 2015-08-19

## 2015-08-15 NOTE — Progress Notes (Addendum)
Subjective: Patient admitted this morning, see detailed H&P by Dr. Hal Hope 81 y.o. male who was recently admitted and discharged 2 weeks ago after being treated for pneumonia was brought to the ER after patient was found to be short of breath and hypoxic after dialysis yesterday. Patient denies any chest pain but has been having productive cough. Denies any nausea vomiting or diarrhea. Patient chest x-ray is unremarkable. EKG shows A. fib with rate control. On exam patient has minimal wheezing. Patient has been hypoxic and has been placed on oxygen.  This morning patient continued to have shortness of breath with coughing up white colored phlegm.  Filed Vitals:   08/15/15 1208 08/15/15 1215  BP: 115/48 115/57  Pulse: 89 101  Temp:    Resp: 20 21    Chest: Clear Bilaterally Heart : S1S2 RRR Abdomen: Soft, nontender Ext : No edema Neuro: Alert, oriented x 3  A/P Acute on chronic respiratory failure, from COPD exacerbation ESRD on hemodialysis Diabetes mellitus Hypertension Chronic anemia with a history of GI bleed Chronic atrial fibrillation   Continue DuoNeb nebulizer, Mucinex 1 tablet by mouth twice a day, Zithromax, Solu-Medrol 60 mg IV every 6 hours. Called nephrology for hemodialysis.      [place] Hospitalist [phone]

## 2015-08-15 NOTE — ED Notes (Signed)
Renal Carb Modified diet ordered for patient.

## 2015-08-15 NOTE — Progress Notes (Signed)
  CAT started.  

## 2015-08-15 NOTE — Progress Notes (Signed)
[name] [id number] Admission Data: 08/15/2015 6:50 PM Attending Provider: Oswald Hillock, MD  PCP:KIM, Nickola Major., DO Consults/ Treatment Team:    Terry [name] is a 81 y.o. male patient admitted from ED awake, alert  & orientated  X 3,  Full Code, VSS - Blood pressure 123/47, pulse 100, temperature 98.1 F (36.7 C), temperature source Oral, resp. rate 20, height 5\' 7"  (1.702 m), weight 94.7 kg (208 lb 12.4 oz), SpO2 92 %., O2    4L L nasal cannular, no c/o shortness of breath, no c/o chest pain, no distress noted. Tele # 12 placed and pt is currently running:atrial fibrillation, with ventricular rate of 96.   IV site WDL:  antecubital left, condition patent and no redness with a transparent dsg that's clean dry and intact.  Allergies:   Allergies  Allergen Reactions  . Yellow Dyes (Non-Tartrazine) Other (See Comments)    Burns arms Cough medications (tussionex and benzonatate ok)  . Levaquin [Levofloxacin In D5w] Itching  . Tussionex Pennkinetic Er [Hydrocod Polst-Cpm Polst Er] Other (See Comments)    Unknown on MAR     Past Medical History  Diagnosis Date  . Stroke ([place]) 2012  . High cholesterol   . Hypertension   . Kidney disease     CKD4, sees [place] [facility], considering dialysis  . COPD (chronic obstructive pulmonary disease) ([place])   . CAD (coronary artery disease) [place], [name]  . Acute and chronic respiratory failure 10/22/2012  . Osteoarthritis 10/22/2012  . Pneumonia April 2014  . Atrial fibrillation ([place])   . Shortness of breath dyspnea     History:  obtained from chart review. Tobacco/alcohol: denied none  Pt orientation to unit, room and routine. Information packet given to patient/family and safety video watched.  Admission INP armband ID verified with patient/family, and in place. SR up x 2, fall risk assessment complete with Patient and family verbalizing understanding of risks associated with falls. Pt verbalizes an understanding of how to use the  call bell and to call for help before getting out of bed.  Skin, clean-dry- intact without evidence of bruising, or skin tears.   No evidence of skin break down noted on exam. no rashes echymosis on arms    Will cont to monitor and assist as needed.  Salley Slaughter, RN 08/15/2015 6:50 PM

## 2015-08-15 NOTE — ED Notes (Signed)
Check on status of breakfast tray, spoke with [place]. Told me she left at 9:35am with the trays.

## 2015-08-15 NOTE — H&P (Addendum)
[facility] History and Physical  Terry [name] [id number] DOB: 04/01/1935 DOA: 08/14/2015  Referring physician: Dr. Lita Mains. PCP: [name], DO  Specialists: Nephrologist for dialysis.  Chief Complaint: Shortness of breath and hypoxia.  HPI: Terry [name] is a 81 y.o. male who was recently admitted and discharged 2 weeks ago after being treated for pneumonia was brought to the ER after patient was found to be short of breath and hypoxic after dialysis yesterday. Patient denies any chest pain but has been having productive cough. Denies any nausea vomiting or diarrhea. Patient chest x-ray is unremarkable. EKG shows A. fib with rate control. On exam patient has minimal wheezing. Patient has been hypoxic and has been placed on oxygen. Patient will be admitted for acute hypoxic respiratory failure cause of which is not clear.   Review of Systems: As presented in the history of presenting illness, rest negative.  Past Medical History  Diagnosis Date  . Stroke ([place]) 2012  . High cholesterol   . Hypertension   . Kidney disease     CKD4, sees [facility], considering dialysis  . COPD (chronic obstructive pulmonary disease) ([place])   . CAD (coronary artery disease) [place], [name]  . Acute and chronic respiratory failure 10/22/2012  . Osteoarthritis 10/22/2012  . Pneumonia April 2014  . Atrial fibrillation ([place])   . Shortness of breath dyspnea    Past Surgical History  Procedure Laterality Date  . Coronary stent placement  1999    2 stents  . Appendectomy  1984  . Colon surgery  2012    partial colon removed - twisted bowel  . Hernia repair      Right inguinal  . Cystoscopy/retrograde/ureteroscopy Bilateral 04/23/2014    Procedure: CYSTOSCOPY BILATERAL RETROGRADE,  LEFT URETEROSCOPY, LEFT STENT PLACEMENT;  Surgeon: Raynelle Bring, MD;  Location: [facility];  Service: Urology;  Laterality: Bilateral;  . Av fistula placement Right 05/21/2015    Procedure: Right Arm  Brachiocephalic ARTERIOVENOUS (AV) FISTULA CREATION;  Surgeon: Angelia Mould, MD;  Location: [place];  Service: Vascular;  Laterality: Right;   Social History:  reports that he quit smoking about 15 years ago. His smoking use included Cigarettes, Pipe, and Cigars. He has a 49 pack-year smoking history. He has never used smokeless tobacco. He reports that he does not drink alcohol or use illicit drugs. Where does patient live home. Can patient participate in ADLs? Yes.  Allergies  Allergen Reactions  . Yellow Dyes (Non-Tartrazine) Other (See Comments)    Burns arms Cough medications (tussionex and benzonatate ok)  . Levaquin [Levofloxacin In D5w] Itching  . Tussionex Pennkinetic Er [Hydrocod Polst-Cpm Polst Er] Other (See Comments)    Unknown on MAR    Family History:  Family History  Problem Relation Age of Onset  . Cancer Mother     Breast  . Heart disease Father   . Heart attack Father   . Parkinson's disease Brother       Prior to Admission medications   Medication Sig Start Date End Date Taking? Authorizing Provider  albuterol (VENTOLIN HFA) 108 (90 BASE) MCG/ACT inhaler Inhale 2 puffs into the lungs every 4 (four) hours as needed for wheezing or shortness of breath. 05/09/15  Yes [name], DO  amLODipine (NORVASC) 10 MG tablet Take 1 tablet (10 mg total) by mouth daily. 06/06/15  Yes Burnell Blanks, MD  atorvastatin (LIPITOR) 20 MG tablet Take 1 tablet (20 mg total) by mouth daily.  03/25/15  Yes Burnell Blanks, MD  benzonatate (TESSALON) 200 MG capsule Take 1 capsule (200 mg total) by mouth 3 (three) times daily as needed for cough. 06/04/15  Yes Ripudeep Krystal Eaton, MD  budesonide-formoterol (SYMBICORT) 160-4.5 MCG/ACT inhaler Inhale 2 puffs into the lungs 2 (two) times daily. 11/07/14  Yes Tanda Rockers, MD  cholecalciferol (VITAMIN D) 1000 units tablet Take 1,000 Units by mouth daily.   Yes Historical Provider, MD  guaiFENesin-dextromethorphan (ROBITUSSIN  DM) 100-10 MG/5ML syrup Take 5 mLs by mouth every 4 (four) hours as needed for cough. 06/04/15  Yes Ripudeep Krystal Eaton, MD  HYDROcodone-homatropine (HYCODAN) 5-1.5 MG/5ML syrup Take 5 mLs by mouth every 6 (six) hours as needed for cough. 08/02/15  Yes Nishant Dhungel, MD  ipratropium-albuterol (DUONEB) 0.5-2.5 (3) MG/3ML SOLN Take 3 mLs by nebulization 3 (three) times daily. 06/04/15  Yes Ripudeep Krystal Eaton, MD  methocarbamol (ROBAXIN) 500 MG tablet Take 1 tablet (500 mg total) by mouth every 8 (eight) hours as needed for muscle spasms. 08/02/15  Yes Nishant Dhungel, MD  metoprolol tartrate (LOPRESSOR) 25 MG tablet Take 2 tablets (50 mg total) by mouth 2 (two) times daily. 8am, 5pm 08/02/15  Yes Nishant Dhungel, MD  Nutritional Supplements (NEPRO) LIQD Take 1 Can by mouth 2 (two) times daily before lunch and supper.   Yes Historical Provider, MD  pantoprazole (PROTONIX) 40 MG tablet Take 1 tablet (40 mg total) by mouth 2 (two) times daily before a meal. 07/04/15  Yes Debbe Odea, MD  polyethylene glycol (MIRALAX) packet Take 17 g by mouth daily as needed for moderate constipation. 06/04/15  Yes Ripudeep Krystal Eaton, MD  senna-docusate (SENOKOT S) 8.6-50 MG tablet Take 2 tablets by mouth at bedtime as needed for mild constipation. 06/04/15  Yes Ripudeep Krystal Eaton, MD  sevelamer carbonate (RENVELA) 800 MG tablet Take 1 tablet (800 mg total) by mouth 3 (three) times daily with meals. 07/04/15  Yes Debbe Odea, MD  tiotropium (SPIRIVA HANDIHALER) 18 MCG inhalation capsule INHALE THE CONTENTS OF ONE CAPSULE INTO LUNGS ONCE DAILY Patient taking differently: Place 18 mcg into inhaler and inhale daily.  04/25/15  Yes Tanda Rockers, MD    Physical Exam: Filed Vitals:   08/15/15 0345 08/15/15 0404 08/15/15 0416 08/15/15 0430  BP: 101/82  119/56 126/55  Pulse: 99  107 104  Temp:      TempSrc:      Resp: 23  20 24   Height:      Weight:      SpO2: 90% 95% 96% 96%     General:  Moderately built and nourished.  Eyes:  Anicteric no pallor.  ENT: No discharge from the ears eyes nose and mouth.  Neck: No mass felt. No JVD appreciated.  Cardiovascular: S1 and S2 heard.  Respiratory: Mild expiratory wheeze or no crepitations.  Abdomen: Soft nontender bowel sounds present.  Skin: No rash.  Musculoskeletal: No edema.  Psychiatric: Appears normal.  Neurologic: Alert awake oriented to time place and person. Moves all extremities.  Labs on Admission:  Basic Metabolic Panel:  Recent Labs Lab 08/14/15 2255  NA 132*  K 4.5  CL 92*  CO2 26  GLUCOSE 239*  BUN 11  CREATININE 2.68*  CALCIUM 8.8*   Liver Function Tests:  Recent Labs Lab 08/14/15 2255  AST 39  ALT 14*  ALKPHOS 63  BILITOT 1.0  PROT 7.5  ALBUMIN 2.9*   No results for input(s): LIPASE, AMYLASE in the last 168 hours.  No results for input(s): AMMONIA in the last 168 hours. CBC:  Recent Labs Lab 08/14/15 2255  WBC 7.0  NEUTROABS 6.5  HGB 11.7*  HCT 38.4*  MCV 91.9  PLT 276   Cardiac Enzymes: No results for input(s): CKTOTAL, CKMB, CKMBINDEX, TROPONINI in the last 168 hours.  BNP (last 3 results)  Recent Labs  05/13/15 2017 06/08/15 0016 07/28/15 2135  BNP 362.1* 892.3* 432.6*    ProBNP (last 3 results) No results for input(s): PROBNP in the last 8760 hours.  CBG: No results for input(s): GLUCAP in the last 168 hours.  Radiological Exams on Admission: Dg Chest 2 View  08/14/2015  CLINICAL DATA:  Shortness of breath and cough for 1 day. EXAM: CHEST  2 VIEW COMPARISON:  07/31/2015 FINDINGS: Hyperexpansion is consistent with emphysema. Stable bibasilar linear densities, likely atelectatic although superimposed pneumonia not entirely excluded. . No focal airspace consolidation or pulmonary edema. No pleural effusion. The cardio pericardial silhouette is enlarged. Right IJ catheter tip projects at the SVC/ RA junction. The visualized bony structures of the thorax are intact. IMPRESSION: Stable exam.  Emphysema  with cardiomegaly and bibasilar atelectasis. Electronically Signed   By: Misty Stanley M.D.   On: 08/14/2015 23:58    EKG: Independently reviewed. A. fib with rate controlled beats around 89 bpm.  Assessment/Plan Active Problems:   CAD (coronary artery disease) of artery bypass graft   COPD exacerbation (HCC)   Acute respiratory failure with hypoxia (HCC)   Chronic atrial fibrillation (HCC)   Chronic diastolic heart failure (HCC)   Anemia of chronic disease   ESRD (end stage renal disease) on dialysis ([place])   1. Acute hypoxic respiratory failure - cause not clear. Could be from COPD exacerbation. Patient is having productive cough for which I have placed patient on doxycycline. Check sputum cultures. Since patient was recently hospitalized I have ordered a VQ scan to rule out PE (patient is allergic to dye). If patient remains hypoxic will be probably need dialysis today also. Patient was dialyzed yesterday. 2. ESRD on hemodialysis on Monday Wednesday and Friday - please consult nephrologist for dialysis. 3. COPD - probably with exacerbation. On nebulizer Pulmicort and doxycycline. If patient has active disease then may need IV Solu-Medrol. 4. CAD status post stenting - denies any chest pain. Check troponin. On statins. 5. Chronic atrial fibrillation presently rate controlled - not on anticoagulation secondary to GI bleed. Chads 2 vasc score is more than 2. 6. Chronic anemia with history of GI bleed - follow CBC. 7. Hypertension on amlodipine. 8. Diabetes mellitus on sliding scale coverage.   DVT Prophylaxis heparin.  Code Status: Full code.  Family Communication: Patient's wife at the bedside.  Disposition Plan: Admit to inpatient.    Amiyrah Lamere N. [facility] Pager [phone].  If 7PM-7AM, please contact night-coverage [email or web address] Password [facility] 08/15/2015, 4:57 AM

## 2015-08-15 NOTE — ED Notes (Signed)
Pt desating to 72% on 2L while sitting upright and trying to undo pants buckle. Moved back into bed and increased oxygen.

## 2015-08-15 NOTE — ED Notes (Signed)
Ordered pt a renal diet, per [facility]. Spoke w/ Hilda Blades.

## 2015-08-15 NOTE — ED Notes (Signed)
Patient transported to X-ray 

## 2015-08-16 DIAGNOSIS — I482 Chronic atrial fibrillation: Secondary | ICD-10-CM

## 2015-08-16 DIAGNOSIS — D638 Anemia in other chronic diseases classified elsewhere: Secondary | ICD-10-CM

## 2015-08-16 DIAGNOSIS — I25700 Atherosclerosis of coronary artery bypass graft(s), unspecified, with unstable angina pectoris: Secondary | ICD-10-CM

## 2015-08-16 DIAGNOSIS — J441 Chronic obstructive pulmonary disease with (acute) exacerbation: Principal | ICD-10-CM

## 2015-08-16 LAB — CBC
HCT: 34.1 % — ABNORMAL LOW (ref 39.0–52.0)
HEMOGLOBIN: 10.7 g/dL — AB (ref 13.0–17.0)
MCH: 28.9 pg (ref 26.0–34.0)
MCHC: 31.4 g/dL (ref 30.0–36.0)
MCV: 92.2 fL (ref 78.0–100.0)
PLATELETS: 275 10*3/uL (ref 150–400)
RBC: 3.7 MIL/uL — AB (ref 4.22–5.81)
RDW: 18.5 % — ABNORMAL HIGH (ref 11.5–15.5)
WBC: 10.1 10*3/uL (ref 4.0–10.5)

## 2015-08-16 LAB — BASIC METABOLIC PANEL
ANION GAP: 12 (ref 5–15)
BUN: 39 mg/dL — ABNORMAL HIGH (ref 6–20)
CO2: 25 mmol/L (ref 22–32)
Calcium: 8.8 mg/dL — ABNORMAL LOW (ref 8.9–10.3)
Chloride: 92 mmol/L — ABNORMAL LOW (ref 101–111)
Creatinine, Ser: 4.95 mg/dL — ABNORMAL HIGH (ref 0.61–1.24)
GFR calc Af Amer: 12 mL/min — ABNORMAL LOW (ref >60)
GFR, EST NON AFRICAN AMERICAN: 10 mL/min — AB (ref >60)
GLUCOSE: 182 mg/dL — AB (ref 65–99)
POTASSIUM: 4.2 mmol/L (ref 3.5–5.1)
Sodium: 129 mmol/L — ABNORMAL LOW (ref 135–145)

## 2015-08-16 LAB — URINE MICROSCOPIC-ADD ON

## 2015-08-16 LAB — GLUCOSE, CAPILLARY
GLUCOSE-CAPILLARY: 226 mg/dL — AB (ref 65–99)
Glucose-Capillary: 155 mg/dL — ABNORMAL HIGH (ref 65–99)
Glucose-Capillary: 128 mg/dL — ABNORMAL HIGH (ref 65–99)

## 2015-08-16 LAB — URINALYSIS, ROUTINE W REFLEX MICROSCOPIC
Glucose, UA: 100 mg/dL — AB
Hgb urine dipstick: NEGATIVE
KETONES UR: 15 mg/dL — AB
NITRITE: NEGATIVE
PH: 5 (ref 5.0–8.0)
SPECIFIC GRAVITY, URINE: 1.023 (ref 1.005–1.030)

## 2015-08-16 LAB — MRSA PCR SCREENING: MRSA BY PCR: POSITIVE — AB

## 2015-08-16 MED ORDER — CHLORHEXIDINE GLUCONATE CLOTH 2 % EX PADS
6.0000 | MEDICATED_PAD | Freq: Every day | CUTANEOUS | Status: DC
  Administered 2015-08-17 – 2015-08-18 (×2): 6 via TOPICAL

## 2015-08-16 MED ORDER — LIDOCAINE HCL (PF) 1 % IJ SOLN
5.0000 mL | INTRAMUSCULAR | Status: DC | PRN
  Filled 2015-08-16: qty 5

## 2015-08-16 MED ORDER — DOXERCALCIFEROL 4 MCG/2ML IV SOLN
1.0000 ug | INTRAVENOUS | Status: DC
  Administered 2015-08-16 – 2015-08-19 (×2): 1 ug via INTRAVENOUS
  Filled 2015-08-16: qty 2

## 2015-08-16 MED ORDER — SODIUM CHLORIDE 0.9 % IV SOLN: 100.0000 mL | INTRAVENOUS | Status: DC | PRN

## 2015-08-16 MED ORDER — DARBEPOETIN ALFA 100 MCG/0.5ML IJ SOSY: 100.0000 ug | PREFILLED_SYRINGE | INTRAMUSCULAR | Status: DC

## 2015-08-16 MED ORDER — HEPARIN SODIUM (PORCINE) 1000 UNIT/ML DIALYSIS
1000.0000 [IU] | INTRAMUSCULAR | Status: DC | PRN
  Filled 2015-08-16: qty 1

## 2015-08-16 MED ORDER — LIDOCAINE-PRILOCAINE 2.5-2.5 % EX CREA: 1.0000 "application " | TOPICAL_CREAM | CUTANEOUS | Status: DC | PRN

## 2015-08-16 MED ORDER — IPRATROPIUM-ALBUTEROL 0.5-2.5 (3) MG/3ML IN SOLN
3.0000 mL | Freq: Three times a day (TID) | RESPIRATORY_TRACT | Status: DC
  Administered 2015-08-17 – 2015-08-18 (×6): 3 mL via RESPIRATORY_TRACT
  Filled 2015-08-16 (×8): qty 3

## 2015-08-16 MED ORDER — RENA-VITE PO TABS
1.0000 | ORAL_TABLET | Freq: Every day | ORAL | Status: DC
  Administered 2015-08-16 – 2015-08-18 (×3): 1 via ORAL
  Filled 2015-08-16 (×3): qty 1

## 2015-08-16 MED ORDER — AMLODIPINE BESYLATE 5 MG PO TABS
5.0000 mg | ORAL_TABLET | Freq: Every day | ORAL | Status: DC
  Administered 2015-08-16 – 2015-08-17 (×2): 5 mg via ORAL
  Filled 2015-08-16 (×3): qty 1

## 2015-08-16 MED ORDER — MUPIROCIN 2 % EX OINT
1.0000 "application " | TOPICAL_OINTMENT | Freq: Two times a day (BID) | CUTANEOUS | Status: DC
  Administered 2015-08-16 – 2015-08-19 (×8): 1 via NASAL
  Filled 2015-08-16: qty 22

## 2015-08-16 MED ORDER — PENTAFLUOROPROP-TETRAFLUOROETH EX AERO: 1.0000 "application " | INHALATION_SPRAY | CUTANEOUS | Status: DC | PRN

## 2015-08-16 MED ORDER — DOXERCALCIFEROL 4 MCG/2ML IV SOLN
INTRAVENOUS | Status: AC
  Administered 2015-08-16: 1 ug
  Filled 2015-08-16: qty 2

## 2015-08-16 MED ORDER — HEPARIN SODIUM (PORCINE) 1000 UNIT/ML DIALYSIS
4000.0000 [IU] | INTRAMUSCULAR | Status: DC | PRN
  Filled 2015-08-16: qty 4

## 2015-08-16 MED ORDER — ALTEPLASE 2 MG IJ SOLR
2.0000 mg | Freq: Once | INTRAMUSCULAR | Status: DC | PRN
  Filled 2015-08-16: qty 2

## 2015-08-16 NOTE — Evaluation (Signed)
Physical Therapy Evaluation Patient Details Name: Terry [name] MRN: [id number] DOB: [birth year] Today's Date: 08/16/2015   History of Present Illness  Patient is a 81 y/o male admitted from [facility] due to SOB. Admitted wtih Acute on chronic respiratory failure- likely multifactorial from COPD exacerbation, pulmonary edema. PMH includes ESRD on M, W, F, CAD; chronic A-fib, COPD   Clinical Impression  Patient presents with dyspnea on exertion, balance deficits and impaired endurance/respiratory status impacting safe mobility. Tolerated short distance ambulation with Min guard assist for safety. Instructed pt on rollator use, brakes, safety. Sp02 dropped to 85% on 2L/min 02 during mobility. Education re: pursed lip breathing, activity expectations, and short bouts of ambulation daily with RN. Pt eager to return home with HHPT, support from wife and rollator to maximize safe mobility and independence. Will follow acutely.     Follow Up Recommendations Home health PT;Supervision for mobility/OOB    Equipment Recommendations  Other (comment) (rollator (4 wheeled walker))    Recommendations for Other Services OT consult     Precautions / Restrictions Precautions Precautions: Fall Precaution Comments: monitor 02 Restrictions Weight Bearing Restrictions: No      Mobility  Bed Mobility               General bed mobility comments: Sitting EOB visiting with sister upon PT arrival.   Transfers Overall transfer level: Needs assistance Equipment used: 4-wheeled walker Transfers: Sit to/from Stand Sit to Stand: Min guard         General transfer comment: Min guard for safety, cues for safety and technique/hand placement. Stood from [employer], from rollator seat x2.  Ambulation/Gait Ambulation/Gait assistance: Min guard Ambulation Distance (Feet): 50 Feet (+40' + 40') Assistive device: 4-wheeled walker Gait Pattern/deviations: Step-through pattern;Decreased stride  length;Trunk flexed Gait velocity: decreased Gait velocity interpretation: Below normal speed for age/gender General Gait Details: Instructed pt on how to use rollator brakes, handles etc. Cues for pursed lip breathing. Sp02 dropped to 85% on 2L/min 02. Recovered within 1-2 minutes of seated rest break. 3/4 DOE.  Stairs            Wheelchair Mobility    Modified Rankin (Stroke Patients Only)       Balance Overall balance assessment: Needs assistance Sitting-balance support: Feet supported;No upper extremity supported Sitting balance-Leahy Scale: Good Sitting balance - Comments: Able to donn shoes sitting EOB with some assist.   Standing balance support: During functional activity Standing balance-Leahy Scale: Fair Standing balance comment: Reliant on UEs for support.                             Pertinent Vitals/Pain Pain Assessment: No/denies pain    Home Living Family/patient expects to be discharged to:: Private residence Living Arrangements: Spouse/significant other Available Help at Discharge: Family Type of Home: House Home Access: [place]: [place]: Environmental consultant - 2 wheels;Bedside commode;Shower seat;Wheelchair - manual      Prior Function Level of Independence: Needs assistance   Gait / Transfers Assistance Needed: Pt reports he was ambulating ~97ft with RW and therapist following with a chair   ADL's / Homemaking Assistance Needed: Pt reports he was getting bathed and dressed with supervision - min guard assist, but fatigues         Hand Dominance   Dominant Hand: Right    Extremity/Trunk Assessment   Upper Extremity Assessment: Defer to OT evaluation  Lower Extremity Assessment: Generalized weakness      Cervical / Trunk Assessment: Normal  Communication   Communication: No difficulties  Cognition Arousal/Alertness: Awake/alert Behavior During Therapy: WFL for tasks  assessed/performed Overall Cognitive Status: Within Functional Limits for tasks assessed                      General Comments General comments (skin integrity, edema, etc.): Sister present in room during session.    Exercises        Assessment/Plan    PT Assessment Patient needs continued PT services  PT Diagnosis Generalized weakness;Difficulty walking   PT Problem List Decreased strength;Cardiopulmonary status limiting activity;Decreased balance;Decreased activity tolerance;Decreased mobility;Decreased knowledge of use of DME  PT Treatment Interventions Balance training;Gait training;Functional mobility training;Therapeutic activities;Therapeutic exercise;Patient/family education   PT Goals (Current goals can be found in the Care Plan section) Acute Rehab PT Goals Patient Stated Goal: to go home PT Goal Formulation: With patient Time For Goal Achievement: 08/30/15 Potential to Achieve Goals: Fair    Frequency Min 3X/week   Barriers to discharge Decreased caregiver support wife works    Co-evaluation               End of Session Equipment Utilized During Treatment: Gait belt;Oxygen Activity Tolerance: Patient limited by fatigue Patient left: in bed;with call bell/phone within reach;with family/visitor present Nurse Communication: Mobility status         Time: [id number] PT Time Calculation (min) (ACUTE ONLY): 38 min   Charges:   PT Evaluation $PT Eval Moderate Complexity: 1 Procedure PT Treatments $Gait Training: 8-22 mins $Self Care/Home Management: 8-22   PT G Codes:        Udell Blasingame A Lunette [name] 08/16/2015, 4:39 PM Wray Kearns, [place], DPT [phone]

## 2015-08-16 NOTE — Progress Notes (Signed)
Increased FIO2 to 4L [place] due to low sats

## 2015-08-16 NOTE — Consult Note (Signed)
[name] Consultation Note    Indication for Consultation:  Management of ESRD/hemodialysis; anemia, hypertension/volume and secondary hyperparathyroidism PCP: [name], DO  HPI: Terry [name] is a 81 y.o. male with ESRD and a complex PMHx on MWF HD at [place] with several recent admission who was most recently discharged 08/02/15 following an admission for acute respiratory failture/HCAP/ fluid overload.  His EDW was lowered 3.5 kg.  He has been residing at [facility].  He started feeling bad Wednesday at dialysis but felt worse once he returned to his NH.  He was evaluated by the PA there, found to be hypoxic and then sent to [facility] for evaluation and subsequent admission.  He attained his EDW Wed with a net UF of 2.5 L. CXR in ED showed COPD with CM and bibasilar atx. He continues to have loose productive cough white phelgm.  SOB is better. No CP, N, V, D fever or chills.  Had small soft BM last evening.  He is currently on HD without problems.  Past Medical History  Diagnosis Date  . Stroke ([place]) 2012  . High cholesterol   . Hypertension   . Kidney disease     CKD4, sees [place] [facility], considering dialysis  . COPD (chronic obstructive pulmonary disease) ([place])   . CAD (coronary artery disease) [place], [name]  . Acute and chronic respiratory failure 10/22/2012  . Osteoarthritis 10/22/2012  . Pneumonia April 2014  . Atrial fibrillation ([place])   . Shortness of breath dyspnea    Past Surgical History  Procedure Laterality Date  . Coronary stent placement  1999    2 stents  . Appendectomy  1984  . Colon surgery  2012    partial colon removed - twisted bowel  . Hernia repair      Right inguinal  . Cystoscopy/retrograde/ureteroscopy Bilateral 04/23/2014    Procedure: CYSTOSCOPY BILATERAL RETROGRADE,  LEFT URETEROSCOPY, LEFT STENT PLACEMENT;  Surgeon: Raynelle Bring, MD;  Location: [facility] ORS;  Service: Urology;  Laterality: Bilateral;  . Av fistula placement  Right 05/21/2015    Procedure: Right Arm Brachiocephalic ARTERIOVENOUS (AV) FISTULA CREATION;  Surgeon: Angelia Mould, MD;  Location: [facility] OR;  Service: Vascular;  Laterality: Right;   Family History  Problem Relation Age of Onset  . Cancer Mother     Breast  . Heart disease Father   . Heart attack Father   . Parkinson's disease Brother    Social History:  reports that he quit smoking about 15 years ago. His smoking use included Cigarettes, Pipe, and Cigars. He has a 49 pack-year smoking history. He has never used smokeless tobacco. He reports that he does not drink alcohol or use illicit drugs. Allergies  Allergen Reactions  . Yellow Dyes (Non-Tartrazine) Other (See Comments)    Burns arms Cough medications (tussionex and benzonatate ok)  . Levaquin [Levofloxacin In D5w] Itching  . Tussionex Pennkinetic Er [Hydrocod Polst-Cpm Polst Er] Other (See Comments)    Unknown on MAR   Prior to Admission medications   Medication Sig Start Date End Date Taking? Authorizing Provider  albuterol (VENTOLIN HFA) 108 (90 BASE) MCG/ACT inhaler Inhale 2 puffs into the lungs every 4 (four) hours as needed for wheezing or shortness of breath. 05/09/15  Yes [name], DO  amLODipine (NORVASC) 10 MG tablet Take 1 tablet (10 mg total) by mouth daily. 06/06/15  Yes Burnell Blanks, MD  atorvastatin (LIPITOR) 20 MG tablet Take 1 tablet (20 mg  total) by mouth daily. 03/25/15  Yes Burnell Blanks, MD  benzonatate (TESSALON) 200 MG capsule Take 1 capsule (200 mg total) by mouth 3 (three) times daily as needed for cough. 06/04/15  Yes Ripudeep Krystal Eaton, MD  budesonide-formoterol (SYMBICORT) 160-4.5 MCG/ACT inhaler Inhale 2 puffs into the lungs 2 (two) times daily. 11/07/14  Yes Tanda Rockers, MD  cholecalciferol (VITAMIN D) 1000 units tablet Take 1,000 Units by mouth daily.   Yes Historical Provider, MD  guaiFENesin-dextromethorphan (ROBITUSSIN DM) 100-10 MG/5ML syrup Take 5 mLs by mouth every 4  (four) hours as needed for cough. 06/04/15  Yes Ripudeep Krystal Eaton, MD  HYDROcodone-homatropine (HYCODAN) 5-1.5 MG/5ML syrup Take 5 mLs by mouth every 6 (six) hours as needed for cough. 08/02/15  Yes Nishant Dhungel, MD  ipratropium-albuterol (DUONEB) 0.5-2.5 (3) MG/3ML SOLN Take 3 mLs by nebulization 3 (three) times daily. 06/04/15  Yes Ripudeep Krystal Eaton, MD  methocarbamol (ROBAXIN) 500 MG tablet Take 1 tablet (500 mg total) by mouth every 8 (eight) hours as needed for muscle spasms. 08/02/15  Yes Nishant Dhungel, MD  metoprolol tartrate (LOPRESSOR) 25 MG tablet Take 2 tablets (50 mg total) by mouth 2 (two) times daily. 8am, 5pm 08/02/15  Yes Nishant Dhungel, MD  Nutritional Supplements (NEPRO) LIQD Take 1 Can by mouth 2 (two) times daily before lunch and supper.   Yes Historical Provider, MD  pantoprazole (PROTONIX) 40 MG tablet Take 1 tablet (40 mg total) by mouth 2 (two) times daily before a meal. 07/04/15  Yes Debbe Odea, MD  polyethylene glycol (MIRALAX) packet Take 17 g by mouth daily as needed for moderate constipation. 06/04/15  Yes Ripudeep Krystal Eaton, MD  senna-docusate (SENOKOT S) 8.6-50 MG tablet Take 2 tablets by mouth at bedtime as needed for mild constipation. 06/04/15  Yes Ripudeep Krystal Eaton, MD  sevelamer carbonate (RENVELA) 800 MG tablet Take 1 tablet (800 mg total) by mouth 3 (three) times daily with meals. 07/04/15  Yes Debbe Odea, MD  tiotropium (SPIRIVA HANDIHALER) 18 MCG inhalation capsule INHALE THE CONTENTS OF ONE CAPSULE INTO LUNGS ONCE DAILY Patient taking differently: Place 18 mcg into inhaler and inhale daily.  04/25/15  Yes Tanda Rockers, MD   Current Facility-Administered Medications  Medication Dose Route Frequency Provider Last Rate Last Dose  . 0.9 %  sodium chloride infusion  100 mL Intravenous PRN Roney Jaffe, MD      . 0.9 %  sodium chloride infusion  100 mL Intravenous PRN Roney Jaffe, MD      . acetaminophen (TYLENOL) tablet 650 mg  650 mg Oral Q6H PRN Rise Patience, MD       Or  . acetaminophen (TYLENOL) suppository 650 mg  650 mg Rectal Q6H PRN Rise Patience, MD      . alteplase (CATHFLO ACTIVASE) injection 2 mg  2 mg Intracatheter Once PRN Roney Jaffe, MD      . amLODipine (NORVASC) tablet 10 mg  10 mg Oral Daily Rise Patience, MD   Stopped at 08/15/15 [phone]  . atorvastatin (LIPITOR) tablet 20 mg  20 mg Oral Daily Rise Patience, MD   20 mg at 08/15/15 1015  . azithromycin (ZITHROMAX) 500 mg in dextrose 5 % 250 mL IVPB  500 mg Intravenous Q24H Oswald Hillock, MD 250 mL/hr at 08/15/15 1014 500 mg at 08/15/15 1014  . benzonatate (TESSALON) capsule 200 mg  200 mg Oral TID PRN Rise Patience, MD   200 mg at 08/16/15  0101  . Chlorhexidine Gluconate Cloth 2 % PADS 6 each  6 each Topical Q0600 Oswald Hillock, MD   6 each at 08/16/15 [phone]  . cholecalciferol (VITAMIN D) tablet 1,000 Units  1,000 Units Oral Daily Rise Patience, MD   1,000 Units at 08/15/15 1015  . feeding supplement (NEPRO CARB STEADY) liquid 237 mL  237 mL Oral BID AC Oswald Hillock, MD   237 mL at 08/15/15 1842  . guaiFENesin (MUCINEX) 12 hr tablet 1,200 mg  1,200 mg Oral BID Oswald Hillock, MD   1,200 mg at 08/15/15 2241  . heparin injection 1,000 Units  1,000 Units Dialysis PRN Roney Jaffe, MD      . Derrill Memo ON 08/17/2015] heparin injection 4,000 Units  4,000 Units Dialysis PRN Roney Jaffe, MD      . heparin injection 5,000 Units  5,000 Units Subcutaneous 3 times per day Rise Patience, MD   5,000 Units at 08/16/15 [phone]  . HYDROcodone-homatropine (HYCODAN) 5-1.5 MG/5ML syrup 5 mL  5 mL Oral Q6H PRN Rhetta Mura Schorr, NP   5 mL at 08/16/15 0052  . insulin aspart (novoLOG) injection 0-9 Units  0-9 Units Subcutaneous TID WC Rise Patience, MD   Stopped at 08/16/15 0800  . ipratropium (ATROVENT) nebulizer solution 0.5 mg  0.5 mg Nebulization Q6H Oswald Hillock, MD   0.5 mg at 08/16/15 0217  . levalbuterol (XOPENEX) nebulizer solution 0.63 mg  0.63 mg  Nebulization Q4H PRN Oswald Hillock, MD   0.63 mg at 08/16/15 0217  . lidocaine (PF) (XYLOCAINE) 1 % injection 5 mL  5 mL Intradermal PRN Roney Jaffe, MD      . lidocaine-prilocaine (EMLA) cream 1 application  1 application Topical PRN Roney Jaffe, MD      . methocarbamol (ROBAXIN) tablet 500 mg  500 mg Oral Q8H PRN Rise Patience, MD   500 mg at 08/16/15 [id number]  . methylPREDNISolone sodium succinate (SOLU-MEDROL) 125 mg/2 mL injection 60 mg  60 mg Intravenous Q6H Oswald Hillock, MD   Stopped at 08/16/15 1200  . metoprolol tartrate (LOPRESSOR) tablet 50 mg  50 mg Oral BID Rise Patience, MD   50 mg at 08/15/15 2241  . mupirocin ointment (BACTROBAN) 2 % 1 application  1 application Nasal BID Oswald Hillock, MD   1 application at [id number]  . ondansetron (ZOFRAN) tablet 4 mg  4 mg Oral Q6H PRN Rise Patience, MD       Or  . ondansetron [facility]) injection 4 mg  4 mg Intravenous Q6H PRN Rise Patience, MD      . pantoprazole (PROTONIX) EC tablet 40 mg  40 mg Oral BID AC Rise Patience, MD   40 mg at 08/15/15 1834  . pentafluoroprop-tetrafluoroeth (GEBAUERS) aerosol 1 application  1 application Topical PRN Roney Jaffe, MD      . polyethylene glycol (MIRALAX / GLYCOLAX) packet 17 g  17 g Oral Daily PRN Rise Patience, MD      . senna-docusate (Senokot-S) tablet 2 tablet  2 tablet Oral QHS PRN Rise Patience, MD      . sevelamer carbonate (RENVELA) tablet 800 mg  800 mg Oral TID WC Rise Patience, MD   Stopped at 08/16/15 0800  . sodium chloride flush (NS) 0.9 % injection 3 mL  3 mL Intravenous Q12H Rise Patience, MD   3 mL at 08/15/15 2244  . technetium TC 61M diethylenetriame-pentaacetic acid (  DTPA) injection 30 milli Curie  30 milli Curie Intravenous Once PRN Oswald Hillock, MD       Labs: Basic Metabolic Panel:  Recent Labs Lab 08/14/15 2255 08/15/15 0856 08/16/15 0733  NA 132* 133* 129*  K 4.5 3.7 4.2  CL 92* 92* 92*  CO2 26 23 25   GLUCOSE  239* 242* 182*  BUN 11 20 39*  CREATININE 2.68* 3.76* 4.95*  CALCIUM 8.8* 8.9 8.8*   Liver Function Tests:  Recent Labs Lab 08/14/15 2255 08/15/15 0856  AST 39 36  ALT 14* 14*  ALKPHOS 63 59  BILITOT 1.0 0.2*  PROT 7.5 7.2  ALBUMIN 2.9* 2.8*  CBC:  Recent Labs Lab 08/14/15 2255 08/15/15 0856 08/16/15 0733  WBC 7.0 6.8 10.1  NEUTROABS 6.5 5.9  --   HGB 11.7* 11.0* 10.7*  HCT 38.4* 36.3* 34.1*  MCV 91.9 92.1 92.2  PLT 276 264 275   Cardiac Enzymes:  Recent Labs Lab 08/15/15 0856 08/15/15 1530 08/15/15 2035  TROPONINI <0.03 <0.03 0.03   CBG:  Recent Labs Lab 08/15/15 1710  GLUCAP 134*  Studies/Results: Dg Chest 2 View  08/14/2015  CLINICAL DATA:  Shortness of breath and cough for 1 day. EXAM: CHEST  2 VIEW COMPARISON:  07/31/2015 FINDINGS: Hyperexpansion is consistent with emphysema. Stable bibasilar linear densities, likely atelectatic although superimposed pneumonia not entirely excluded. . No focal airspace consolidation or pulmonary edema. No pleural effusion. The cardio pericardial silhouette is enlarged. Right IJ catheter tip projects at the SVC/ RA junction. The visualized bony structures of the thorax are intact. IMPRESSION: Stable exam.  Emphysema with cardiomegaly and bibasilar atelectasis. Electronically Signed   By: Misty Stanley M.D.   On: 08/14/2015 23:58   Nm Pulmonary Perf And Vent  08/15/2015  CLINICAL DATA:  Shortness of breath, history hypertension, end-stage renal disease on dialysis, hypertension, COPD, coronary artery disease EXAM: NUCLEAR MEDICINE VENTILATION - PERFUSION LUNG SCAN TECHNIQUE: Ventilation images were obtained in multiple projections using inhaled aerosol Tc-22m DTPA. Perfusion images were obtained in multiple projections after intravenous injection of Tc-66m MAA. RADIOPHARMACEUTICALS:  32 mCi technetium-42m DTPA aerosol inhalation and 4.1 millicuries 0000000 MAA IV COMPARISON:  None; radiographic correlation chest radiograph  08/14/2015 FINDINGS: Ventilation: Extensive central airway deposition of aerosol. Poor peripheral aerosol distribution to alveoli. Decreased ventilation seen in RIGHT upper lobe and to lesser degrees in LEFT upper and LEFT lower lobes. Perfusion: Small subsegmental perfusion defect RIGHT middle lobe, without definite ventilatory defect. Otherwise better perfusion than ventilation throughout the remaining lungs. No additional segmental or subsegmental perfusion defects identified. Chest radiograph:  RIGHT basilar atelectasis.  Underlying COPD. IMPRESSION: Small subsegmental perfusion defect RIGHT middle lobe with normal ventilation. Markedly impaired ventilation throughout remaining lungs particularly RIGHT upper lobe and LEFT lower lobe. Findings represent a low probability for pulmonary embolism. Electronically Signed   By: Lavonia Dana M.D.   On: 08/15/2015 11:54    ROS: As per HPI otherwise negative.  Physical Exam: Filed Vitals:   08/16/15 0725 08/16/15 0730 08/16/15 0759 08/16/15 0830  BP: 113/60 118/60 118/58 106/60  Pulse: 100 93 98 100  Temp:      TempSrc:      Resp:      Height:      Weight:      SpO2:         General: Elderly obese WM Head: Normocephalic, atraumatic, sclera non-icteric, mucus membranes are moist, dentition poor Neck: Supple. JVD not elevated. Lungs: Remarkably clear anteriorly. Breathing is unlabored  wearing O2 Heart: RRR with S1 S2. No murmurs, rubs, or gallops appreciated. Abdomen: Obese Soft, non-tender, non-distended with normoactive bowel sounds. No rebound/guarding Lower extremities:without edema or ischemic changes, no open wounds  Neuro: Alert and oriented X 3. Moves all extremities spontaneously. Psych:  Responds to questions appropriately with a normal affect. Dialysis Access:right IJ cath; maturing right upper AVF  Dialysis Orders: Center: [facility] 4 hr 180 400/800 EDW 73.5 2 K 2.25 Ca right IJ heparin 8600 mircera 225 q 2 weeks - just given  2/15- on hold  now due to probably false high hgb of 13 but needs a taper at d/c venofer none Hectorol 1 Recent labs: Hgb 10.6 2/22 iPTH 85 corr Ca 7.7 P 2  Assessment/Plan: 1.  Recurrenent acute on chronic resp failure/exacerabation of COPD - meds per primary - looks pretty good today; on IV steroids/Zithro/nebs 2.  ESRD -  MWF on HD per routine K 4.2 - staff had changed to 2 K bath per protocol - had changed to 3 K bath due to hx afib; heparin dose needs to be lowered at d/c 3.  Hypertension/volume  - BP controlled - drops during HD - decrease norvasc to 5 and change to HS dosing - may ultimately need to be stopped to facility volume removal; according to Dr. Sanda Klein outpt note, at one point he apparently had been on midodrine 4.  Anemia  - hgb 10.7 - previously Mircera 226 q 2 weeks held at max dose -last received 2/15 - on hectorol and daily vit d 5.  Metabolic bone disease -  Continue hectorol - last outpt P was 2 - will discontinue renvela for now 6.  Nutrition- renal carb mod diet/vit 7. Afib - on MTP 50 bid, no anticoagulation due to hx of GIB  8. Contact recautions   Myriam Jacobson, PA-C [name] [phone] 08/16/2015, 9:21 AM   Pt seen, examined and agree w A/P as above.  Kelly Splinter MD [employer] pager [phone]    cell [phone] 08/16/2015, 5:22 PM

## 2015-08-16 NOTE — Progress Notes (Signed)
Visit to patients room for 0200 tx atrovent.  Pt has  Some SOB I gave him a PRN Xopenex as well with the Atrovent.

## 2015-08-16 NOTE — Progress Notes (Signed)
[facility] PROGRESS NOTE  CLUSTER ACRE [id number] DOB: 1935-06-08 DOA: 08/14/2015 PCP: [name], DO  Assessment/Plan: 1. Acute on chronic respiratory failure- likely multifactorial from COPD exacerbation, pulmonary edema. Patient is breathing better after starting Solu-Medrol, DuoNeb nebulizers, Mucinex, Zithromax. And also receiving hemodialysis today. 2. ESRD on hemodialysis- patient followed by nephrology. Underwent hemodialysis today. 3. Diabetes mellitus- continue sliding scale insulin with NovoLog. 4. Chronic atrial fibrillation- hearted controlled, chads VASC score is more than 2, patient not on anticoagulation due to history of GI bleed. 5. Hypertension- blood pressure is controlled, continue amlodipine 6. CAD status post stenting- no chest pain. Stable  Code Status: Full code Family Communication: Discussed with wife at bedside on 08/15/2015 Disposition Plan: Pending improvement in respiratory status   Consultants:  Nephrology  Procedures:  None  Antibiotics:  Zithromax  HPI/Subjective: 81 y.o. male who was recently admitted and discharged 2 weeks ago after being treated for pneumonia was brought to the ER after patient was found to be short of breath and hypoxic after dialysis yesterday. Patient denies any chest pain but has been having productive cough. Denies any nausea vomiting or diarrhea. Patient chest x-ray is unremarkable. EKG shows A. fib with rate control. On exam patient has minimal wheezing. Patient has been hypoxic and has been placed on oxygen.   Patient's breathing has improved today. Though continues to have wheezing.  Objective: Filed Vitals:   08/16/15 1441 08/16/15 1442  BP: 85/46 94/49  Pulse: 98 99  Temp: 98.2 F (36.8 C)   Resp: 18     Intake/Output Summary (Last 24 hours) at 08/16/15 1552 Last data filed at 08/16/15 1057  Gross per 24 hour  Intake    490 ml  Output   3500 ml  Net  -3010 ml   Filed Weights   08/14/15 2243 08/15/15 1558 08/16/15 0711  Weight: 78.019 kg (172 lb) 94.7 kg (208 lb 12.4 oz) 74.5 kg (164 lb 3.9 oz)    Exam:   General:  Appears in no acute distress  Cardiovascular: S1-S2 regular  Respiratory: Bilateral wheezing  Abdomen: Soft, nontender, no organomegaly  Musculoskeletal: No cyanosis/clubbing/edema of the lower extremities   Data Reviewed: Basic Metabolic Panel:  Recent Labs Lab 08/14/15 2255 08/15/15 0856 08/16/15 0733  NA 132* 133* 129*  K 4.5 3.7 4.2  CL 92* 92* 92*  CO2 26 23 25   GLUCOSE 239* 242* 182*  BUN 11 20 39*  CREATININE 2.68* 3.76* 4.95*  CALCIUM 8.8* 8.9 8.8*   Liver Function Tests:  Recent Labs Lab 08/14/15 2255 08/15/15 0856  AST 39 36  ALT 14* 14*  ALKPHOS 63 59  BILITOT 1.0 0.2*  PROT 7.5 7.2  ALBUMIN 2.9* 2.8*   CBC:  Recent Labs Lab 08/14/15 2255 08/15/15 0856 08/16/15 0733  WBC 7.0 6.8 10.1  NEUTROABS 6.5 5.9  --   HGB 11.7* 11.0* 10.7*  HCT 38.4* 36.3* 34.1*  MCV 91.9 92.1 92.2  PLT 276 264 275   Cardiac Enzymes:  Recent Labs Lab 08/15/15 0856 08/15/15 1530 08/15/15 2035  TROPONINI <0.03 <0.03 0.03   BNP (last 3 results)  Recent Labs  05/13/15 2017 06/08/15 0016 07/28/15 2135  BNP 362.1* 892.3* 432.6*    ProBNP (last 3 results) No results for input(s): PROBNP in the last 8760 hours.  CBG:  Recent Labs Lab 08/15/15 1710 08/16/15 1156  GLUCAP 134* 155*    Recent Results (from the past 240 hour(s))  Culture, expectorated sputum-assessment  Status: None   Collection Time: 08/15/15  7:50 AM  Result Value Ref Range Status   Specimen Description EXPECTORATED SPUTUM  Final   Special Requests Normal  Final   Sputum evaluation   Final    MICROSCOPIC FINDINGS SUGGEST THAT THIS SPECIMEN IS NOT REPRESENTATIVE OF LOWER RESPIRATORY SECRETIONS. PLEASE RECOLLECT. Gram Stain Report Called to,Read Back By and Verified With: C CARLAN,RN AT 1009 08/15/15 BY L BENFIELD    Report Status  08/15/2015 FINAL  Final  Culture, respiratory (NON-Expectorated)     Status: None (Preliminary result)   Collection Time: 08/15/15  4:28 PM  Result Value Ref Range Status   Specimen Description SPUTUM  Final   Special Requests NONE  Final   Gram Stain   Final    ABUNDANT WBC PRESENT,BOTH PMN AND MONONUCLEAR MODERATE SQUAMOUS EPITHELIAL CELLS PRESENT ABUNDANT GRAM NEGATIVE RODS MODERATE GRAM POSITIVE COCCI IN PAIRS THIS SPECIMEN IS ACCEPTABLE FOR SPUTUM CULTURE Performed at [facility]    Culture PENDING  Incomplete   Report Status PENDING  Incomplete  MRSA PCR Screening     Status: Abnormal   Collection Time: 08/16/15 12:24 AM  Result Value Ref Range Status   MRSA by PCR POSITIVE (A) NEGATIVE Final    Comment:        The GeneXpert MRSA Assay (FDA approved for NASAL specimens only), is one component of a comprehensive MRSA colonization surveillance program. It is not intended to diagnose MRSA infection nor to guide or monitor treatment for MRSA infections. RESULT CALLED TO, READ BACK BY AND VERIFIED WITH: M RUDISILL,RN @0217  08/16/15 MKELLY      Studies: Dg Chest 2 View  08/14/2015  CLINICAL DATA:  Shortness of breath and cough for 1 day. EXAM: CHEST  2 VIEW COMPARISON:  07/31/2015 FINDINGS: Hyperexpansion is consistent with emphysema. Stable bibasilar linear densities, likely atelectatic although superimposed pneumonia not entirely excluded. . No focal airspace consolidation or pulmonary edema. No pleural effusion. The cardio pericardial silhouette is enlarged. Right IJ catheter tip projects at the SVC/ RA junction. The visualized bony structures of the thorax are intact. IMPRESSION: Stable exam.  Emphysema with cardiomegaly and bibasilar atelectasis. Electronically Signed   By: Misty Stanley M.D.   On: 08/14/2015 23:58   Nm Pulmonary Perf And Vent  08/15/2015  CLINICAL DATA:  Shortness of breath, history hypertension, end-stage renal disease on dialysis,  hypertension, COPD, coronary artery disease EXAM: NUCLEAR MEDICINE VENTILATION - PERFUSION LUNG SCAN TECHNIQUE: Ventilation images were obtained in multiple projections using inhaled aerosol Tc-64m DTPA. Perfusion images were obtained in multiple projections after intravenous injection of Tc-8m MAA. RADIOPHARMACEUTICALS:  32 mCi technetium-74m DTPA aerosol inhalation and 4.1 millicuries 0000000 MAA IV COMPARISON:  None; radiographic correlation chest radiograph 08/14/2015 FINDINGS: Ventilation: Extensive central airway deposition of aerosol. Poor peripheral aerosol distribution to alveoli. Decreased ventilation seen in RIGHT upper lobe and to lesser degrees in LEFT upper and LEFT lower lobes. Perfusion: Small subsegmental perfusion defect RIGHT middle lobe, without definite ventilatory defect. Otherwise better perfusion than ventilation throughout the remaining lungs. No additional segmental or subsegmental perfusion defects identified. Chest radiograph:  RIGHT basilar atelectasis.  Underlying COPD. IMPRESSION: Small subsegmental perfusion defect RIGHT middle lobe with normal ventilation. Markedly impaired ventilation throughout remaining lungs particularly RIGHT upper lobe and LEFT lower lobe. Findings represent a low probability for pulmonary embolism. Electronically Signed   By: Lavonia Dana M.D.   On: 08/15/2015 11:54    Scheduled Meds: . amLODipine  5 mg Oral QHS  .  atorvastatin  20 mg Oral Daily  . azithromycin  500 mg Intravenous Q24H  . Chlorhexidine Gluconate Cloth  6 each Topical Q0600  . cholecalciferol  1,000 Units Oral Daily  . [START ON 08/21/2015] darbepoetin (ARANESP) injection - DIALYSIS  100 mcg Intravenous Q Wed-HD  . doxercalciferol  1 mcg Intravenous Q M,W,F-HD  . feeding supplement (NEPRO CARB STEADY)  237 mL Oral BID AC  . guaiFENesin  1,200 mg Oral BID  . heparin  5,000 Units Subcutaneous 3 times per day  . insulin aspart  0-9 Units Subcutaneous TID WC  . ipratropium  0.5  mg Nebulization Q6H  . methylPREDNISolone (SOLU-MEDROL) injection  60 mg Intravenous Q6H  . metoprolol tartrate  50 mg Oral BID  . multivitamin  1 tablet Oral QHS  . mupirocin ointment  1 application Nasal BID  . pantoprazole  40 mg Oral BID AC  . sodium chloride flush  3 mL Intravenous Q12H   Continuous Infusions:   Active Problems:   CAD (coronary artery disease) of artery bypass graft   COPD exacerbation (HCC)   Acute respiratory failure with hypoxia (HCC)   Chronic atrial fibrillation (HCC)   Chronic diastolic heart failure (HCC)   Anemia of chronic disease   ESRD (end stage renal disease) on dialysis ([place])    Time spent: *25 min    [facility] Pager [phone]. If 7PM-7AM, please contact night-coverage at [email or web address], password [facility] 08/16/2015, 3:52 PM  LOS: 1 day

## 2015-08-17 DIAGNOSIS — N186 End stage renal disease: Secondary | ICD-10-CM

## 2015-08-17 DIAGNOSIS — Z992 Dependence on renal dialysis: Secondary | ICD-10-CM

## 2015-08-17 LAB — GLUCOSE, CAPILLARY
GLUCOSE-CAPILLARY: 236 mg/dL — AB (ref 65–99)
Glucose-Capillary: 151 mg/dL — ABNORMAL HIGH (ref 65–99)
Glucose-Capillary: 167 mg/dL — ABNORMAL HIGH (ref 65–99)
Glucose-Capillary: 274 mg/dL — ABNORMAL HIGH (ref 65–99)

## 2015-08-17 MED ORDER — DEXTROSE 5 % IV SOLN
1.0000 g | INTRAVENOUS | Status: DC
  Administered 2015-08-17: 1 g via INTRAVENOUS
  Filled 2015-08-17 (×2): qty 10

## 2015-08-17 NOTE — Progress Notes (Signed)
[facility] PROGRESS NOTE  Terry [name] [id number] DOB: 05-Aug-1934 DOA: 08/14/2015 PCP: [name], DO  Assessment/Plan: 1. Acute on chronic respiratory failure- improved,  likely multifactorial from COPD exacerbation, pulmonary edema. Patient is breathing better after starting Solu-Medrol, DuoNeb nebulizers, Mucinex, Zithromax. Will continue with solumedrol and discahrge home on prednisone taper over two weeks. Will also start Prednisone 20 mg po daily, and follow up with Dr Melvyn Novas as outpatient. Sputum culture growing Pseudomonas aeroginosa. 2. ESRD on hemodialysis- patient followed by nephrology. Underwent hemodialysis today. 3. UTI- patient had abnormal UA yesterday, urine culture obtained and result is pending, will empirically start ceftriaxone. 4. Diabetes mellitus- continue sliding scale insulin with NovoLog. 5. Chronic atrial fibrillation- heart rate controlled, chads VASC score is more than 2, patient not on anticoagulation due to history of GI bleed. 6. Hypertension- blood pressure is controlled, continue amlodipine 7. CAD status post stenting- no chest pain. Stable 8. DVT prophylaxis- heparin.  Code Status: Full code Family Communication: Discussed with wife at bedside on 08/15/2015 Disposition Plan: Pending improvement in respiratory status   Consultants:  Nephrology  Procedures:  None  Antibiotics:  Zithromax  HPI/Subjective: 82 y.o. male who was recently admitted and discharged 2 weeks ago after being treated for pneumonia was brought to the ER after patient was found to be short of breath and hypoxic after dialysis yesterday. Patient denies any chest pain but has been having productive cough. Denies any nausea vomiting or diarrhea. Patient chest x-ray is unremarkable. EKG shows A. fib with rate control. On exam patient has minimal wheezing. Patient has been hypoxic and has been placed on oxygen.   Patient's breathing has improved, no longer  wheezing.  Objective: Filed Vitals:   08/17/15 0945 08/17/15 1434  BP:  99/57  Pulse: 82 89  Temp:  97.7 F (36.5 C)  Resp:  18    Intake/Output Summary (Last 24 hours) at 08/17/15 1449 Last data filed at 08/17/15 1435  Gross per 24 hour  Intake   1402 ml  Output      0 ml  Net   1402 ml   Filed Weights   08/15/15 1558 08/16/15 0711 08/17/15 0600  Weight: 94.7 kg (208 lb 12.4 oz) 74.5 kg (164 lb 3.9 oz) 71.5 kg (157 lb 10.1 oz)    Exam:   General:  Appears in no acute distress  Cardiovascular: S1-S2 regular  Respiratory: Bilateral wheezing  Abdomen: Soft, nontender, no organomegaly  Musculoskeletal: No cyanosis/clubbing/edema of the lower extremities   Data Reviewed: Basic Metabolic Panel:  Recent Labs Lab 08/14/15 2255 08/15/15 0856 08/16/15 0733  NA 132* 133* 129*  K 4.5 3.7 4.2  CL 92* 92* 92*  CO2 26 23 25   GLUCOSE 239* 242* 182*  BUN 11 20 39*  CREATININE 2.68* 3.76* 4.95*  CALCIUM 8.8* 8.9 8.8*   Liver Function Tests:  Recent Labs Lab 08/14/15 2255 08/15/15 0856  AST 39 36  ALT 14* 14*  ALKPHOS 63 59  BILITOT 1.0 0.2*  PROT 7.5 7.2  ALBUMIN 2.9* 2.8*   CBC:  Recent Labs Lab 08/14/15 2255 08/15/15 0856 08/16/15 0733  WBC 7.0 6.8 10.1  NEUTROABS 6.5 5.9  --   HGB 11.7* 11.0* 10.7*  HCT 38.4* 36.3* 34.1*  MCV 91.9 92.1 92.2  PLT 276 264 275   Cardiac Enzymes:  Recent Labs Lab 08/15/15 0856 08/15/15 1530 08/15/15 2035  TROPONINI <0.03 <0.03 0.03   BNP (last 3 results)  Recent Labs  05/13/15 2017 06/08/15  0016 07/28/15 2135  BNP 362.1* 892.3* 432.6*    ProBNP (last 3 results) No results for input(s): PROBNP in the last 8760 hours.  CBG:  Recent Labs Lab 08/16/15 1156 08/16/15 1701 08/16/15 2233 08/17/15 0805 08/17/15 1225  GLUCAP 155* 128* 226* 151* 236*    Recent Results (from the past 240 hour(s))  Culture, blood (routine x 2)     Status: None (Preliminary result)   Collection Time: 08/14/15 10:55  PM  Result Value Ref Range Status   Specimen Description BLOOD BLOOD LEFT WRIST  Final   Special Requests BOTTLES DRAWN AEROBIC AND ANAEROBIC 5ML  Final   Culture NO GROWTH 2 DAYS  Final   Report Status PENDING  Incomplete  Culture, blood (routine x 2)     Status: None (Preliminary result)   Collection Time: 08/14/15 11:08 PM  Result Value Ref Range Status   Specimen Description BLOOD LEFT ARM  Final   Special Requests BOTTLES DRAWN AEROBIC AND ANAEROBIC 5ML  Final   Culture NO GROWTH 2 DAYS  Final   Report Status PENDING  Incomplete  Culture, expectorated sputum-assessment     Status: None   Collection Time: 08/15/15  7:50 AM  Result Value Ref Range Status   Specimen Description EXPECTORATED SPUTUM  Final   Special Requests Normal  Final   Sputum evaluation   Final    MICROSCOPIC FINDINGS SUGGEST THAT THIS SPECIMEN IS NOT REPRESENTATIVE OF LOWER RESPIRATORY SECRETIONS. PLEASE RECOLLECT. Gram Stain Report Called to,Read Back By and Verified With: C CARLAN,RN AT 1009 08/15/15 BY L BENFIELD    Report Status 08/15/2015 FINAL  Final  Culture, respiratory (NON-Expectorated)     Status: None (Preliminary result)   Collection Time: 08/15/15  4:28 PM  Result Value Ref Range Status   Specimen Description SPUTUM  Final   Special Requests NONE  Final   Gram Stain   Final    ABUNDANT WBC PRESENT,BOTH PMN AND MONONUCLEAR MODERATE SQUAMOUS EPITHELIAL CELLS PRESENT ABUNDANT GRAM NEGATIVE RODS MODERATE GRAM POSITIVE COCCI IN PAIRS THIS SPECIMEN IS ACCEPTABLE FOR SPUTUM CULTURE Performed at [facility]    Culture   Final    ABUNDANT PSEUDOMONAS AERUGINOSA Performed at [facility]    Report Status PENDING  Incomplete  MRSA PCR Screening     Status: Abnormal   Collection Time: 08/16/15 12:24 AM  Result Value Ref Range Status   MRSA by PCR POSITIVE (A) NEGATIVE Final    Comment:        The GeneXpert MRSA Assay (FDA approved for NASAL specimens only), is one component of  a comprehensive MRSA colonization surveillance program. It is not intended to diagnose MRSA infection nor to guide or monitor treatment for MRSA infections. RESULT CALLED TO, READ BACK BY AND VERIFIED WITH: M RUDISILL,RN @0217  08/16/15 MKELLY   Urine culture     Status: None (Preliminary result)   Collection Time: 08/16/15  6:55 AM  Result Value Ref Range Status   Specimen Description URINE, CLEAN CATCH  Final   Special Requests NONE  Final   Culture CULTURE REINCUBATED FOR BETTER GROWTH  Final   Report Status PENDING  Incomplete     Studies: No results found.  Scheduled Meds: . amLODipine  5 mg Oral QHS  . atorvastatin  20 mg Oral Daily  . azithromycin  500 mg Intravenous Q24H  . cefTRIAXone (ROCEPHIN)  IV  1 g Intravenous Q24H  . Chlorhexidine Gluconate Cloth  6 each Topical Q0600  . cholecalciferol  1,000 Units Oral Daily  . [START ON 08/21/2015] darbepoetin (ARANESP) injection - DIALYSIS  100 mcg Intravenous Q Wed-HD  . doxercalciferol  1 mcg Intravenous Q M,W,F-HD  . feeding supplement (NEPRO CARB STEADY)  237 mL Oral BID AC  . guaiFENesin  1,200 mg Oral BID  . heparin  5,000 Units Subcutaneous 3 times per day  . insulin aspart  0-9 Units Subcutaneous TID WC  . ipratropium-albuterol  3 mL Nebulization TID  . methylPREDNISolone (SOLU-MEDROL) injection  60 mg Intravenous Q6H  . metoprolol tartrate  50 mg Oral BID  . multivitamin  1 tablet Oral QHS  . mupirocin ointment  1 application Nasal BID  . pantoprazole  40 mg Oral BID AC  . sodium chloride flush  3 mL Intravenous Q12H   Continuous Infusions:   Active Problems:   CAD (coronary artery disease) of artery bypass graft   COPD exacerbation (HCC)   Acute respiratory failure with hypoxia (HCC)   Chronic atrial fibrillation (HCC)   Chronic diastolic heart failure (HCC)   Anemia of chronic disease   ESRD (end stage renal disease) on dialysis ([place])    Time spent: *25 min    [facility] Pager [phone]. If 7PM-7AM, please contact night-coverage at [email or web address], password [facility] 08/17/2015, 2:49 PM  LOS: 2 days

## 2015-08-17 NOTE — Care Management Note (Addendum)
Case Management Note  Patient Details  Name: Terry [name] MRN: [id number] Date of Birth: 1934/12/27  Subjective/Objective:                  1. exacerabation of COPD Action/Plan: Discharge planning Expected Discharge Date:  08/18/15              Expected Discharge Plan:  [place]  In-House Referral:     Discharge planning Services  CM Consult  Post Acute Care Choice:  Durable Medical Equipment Choice offered to:     DME Arranged:  Walker rolling with seat DME Agency:  [place]:  PT [facility] Agency:  [place]  Status of Service:  In process, will continue to follow  Medicare Important Message Given:    Date Medicare IM Given:    Medicare IM give by:    Date Additional Medicare IM Given:    Additional Medicare Important Message give by:     If discussed at [place] of Stay Meetings, dates discussed:    Additional Comments: CM met with pt in room who states he has a nebulizer machine at home but does not know how to use it.  CM called [facility] DME rep, Merry Proud who kindly has offered to take a neb machine to the room and instruct the pt on it's use.  Also Merry Proud has offered to bring a rollator to the pt who is hesitant on how to use the brakes for safety.  CM called referral for HHPT to [facility] rep, Tiffany.  CM requested face to face and HHPT order from MD.  No other CM needs were communicated. Dellie Catholic, RN 08/17/2015, 12:02 PM

## 2015-08-17 NOTE — Progress Notes (Addendum)
Patient refused walker with rolling seat at this time. Patient states he has a walker at home he felt more comfortable using. Patient asked if there is a different walker that has a break when you release the handle and the CM stated she did not know of one. Patient updated.

## 2015-08-17 NOTE — Progress Notes (Signed)
[facility] Progress Note  Dialysis Orders: Center: [facility] 4 hr 180 400/800 EDW 73.5 2 K 2.25 Ca right IJ heparin 8600 mircera 225 q 2 weeks - just given 2/15- on hold now due to probably false high hgb of 13 but needs a taper at d/c venofer none Hectorol 1 Recent labs: Hgb 10.6 2/22 iPTH 85 corr Ca 7.7 P 2  Assessment/Plan: 1. Recurrenent acute on chronic resp failure/exacerabation of COPD - meds per primary - looks good today; on IV steroids/Zithro/nebs 2. ESRD - MWF on HD - next HD Monday; heparin dose needs to be lowered at d/c 3. Hypertension/volume - BP controlled - drops during HD - decreased norvasc to 5 and change to HS dosing - may ultimately need to be stopped to facility volume removal; according to Dr. Sanda Klein outpt note, at one point he apparently had been on midodrine Had net UF 3.5 on HD Friday with post weight NOT DONE - by calculations it should be about 71- will have a lower edw at d/c 4. Anemia - hgb 10.7 - previously Mircera 226 q 2 weeks held at max dose -last received 2/15 - on hectorol and daily vit d 5. Metabolic bone disease - Continue hectorol - last outpt P was 2 - will discontinue renvela for now 6. Nutrition- renal carb mod diet/vit 7. Afib - on MTP 50 bid, no anticoagulation due to hx of GIB  8. Contact recautions  Myriam Jacobson, PA-C [place] [phone] 08/17/2015,11:34 AM  LOS: 2 days   Pt seen, examined and agree w A/P as above.  Kelly Splinter MD [facility] pager [phone]    cell [phone] 08/17/2015, 12:42 PM    Subjective:   No c/o; still coughing a lot. Appetite would be better if the food tasted better  Objective Filed Vitals:   08/17/15 0510 08/17/15 0600 08/17/15 0944 08/17/15 0945  BP: 120/53  115/55   Pulse: 94   82  Temp: 97.8 F (36.6 C)     TempSrc: Oral     Resp: 18     Height:      Weight:  71.5 kg (157 lb 10.1 oz)    SpO2: 90%      Physical Exam General:  NAD Heart: RRR Lungs: dim BS poor expansion Abdomen: soft NT Extremities: no LE edema Dialysis Access: right IJ and maturing right upper AVF  Additional Objective Labs: Basic Metabolic Panel:  Recent Labs Lab 08/14/15 2255 08/15/15 0856 08/16/15 0733  NA 132* 133* 129*  K 4.5 3.7 4.2  CL 92* 92* 92*  CO2 26 23 25   GLUCOSE 239* 242* 182*  BUN 11 20 39*  CREATININE 2.68* 3.76* 4.95*  CALCIUM 8.8* 8.9 8.8*   Liver Function Tests:  Recent Labs Lab 08/14/15 2255 08/15/15 0856  AST 39 36  ALT 14* 14*  ALKPHOS 63 59  BILITOT 1.0 0.2*  PROT 7.5 7.2  ALBUMIN 2.9* 2.8*  CBC:  Recent Labs Lab 08/14/15 2255 08/15/15 0856 08/16/15 0733  WBC 7.0 6.8 10.1  NEUTROABS 6.5 5.9  --   HGB 11.7* 11.0* 10.7*  HCT 38.4* 36.3* 34.1*  MCV 91.9 92.1 92.2  PLT 276 264 275   Blood Culture    Component Value Date/Time   SDES SPUTUM 08/15/2015 1628   SPECREQUEST NONE 08/15/2015 1628   CULT PENDING 08/15/2015 1628   REPTSTATUS PENDING 08/15/2015 1628    Cardiac Enzymes:  Recent Labs Lab 08/15/15 0856 08/15/15 1530 08/15/15 2035  TROPONINI <0.03 <0.03 0.03   CBG:  Recent Labs Lab 08/15/15 1710 08/16/15 1156 08/16/15 1701 08/16/15 2233 08/17/15 0805  GLUCAP 134* 155* 128* 226* 151*   Studies/Results: Nm Pulmonary Perf And Vent  08/15/2015  CLINICAL DATA:  Shortness of breath, history hypertension, end-stage renal disease on dialysis, hypertension, COPD, coronary artery disease EXAM: NUCLEAR MEDICINE VENTILATION - PERFUSION LUNG SCAN TECHNIQUE: Ventilation images were obtained in multiple projections using inhaled aerosol Tc-62m DTPA. Perfusion images were obtained in multiple projections after intravenous injection of Tc-8m MAA. RADIOPHARMACEUTICALS:  32 mCi technetium-48m DTPA aerosol inhalation and 4.1 millicuries 0000000 MAA IV COMPARISON:  None; radiographic correlation chest radiograph 08/14/2015 FINDINGS: Ventilation: Extensive central airway deposition  of aerosol. Poor peripheral aerosol distribution to alveoli. Decreased ventilation seen in RIGHT upper lobe and to lesser degrees in LEFT upper and LEFT lower lobes. Perfusion: Small subsegmental perfusion defect RIGHT middle lobe, without definite ventilatory defect. Otherwise better perfusion than ventilation throughout the remaining lungs. No additional segmental or subsegmental perfusion defects identified. Chest radiograph:  RIGHT basilar atelectasis.  Underlying COPD. IMPRESSION: Small subsegmental perfusion defect RIGHT middle lobe with normal ventilation. Markedly impaired ventilation throughout remaining lungs particularly RIGHT upper lobe and LEFT lower lobe. Findings represent a low probability for pulmonary embolism. Electronically Signed   By: Lavonia Dana M.D.   On: 08/15/2015 11:54   Medications:   . amLODipine  5 mg Oral QHS  . atorvastatin  20 mg Oral Daily  . azithromycin  500 mg Intravenous Q24H  . Chlorhexidine Gluconate Cloth  6 each Topical Q0600  . cholecalciferol  1,000 Units Oral Daily  . [START ON 08/21/2015] darbepoetin (ARANESP) injection - DIALYSIS  100 mcg Intravenous Q Wed-HD  . doxercalciferol  1 mcg Intravenous Q M,W,F-HD  . feeding supplement (NEPRO CARB STEADY)  237 mL Oral BID AC  . guaiFENesin  1,200 mg Oral BID  . heparin  5,000 Units Subcutaneous 3 times per day  . insulin aspart  0-9 Units Subcutaneous TID WC  . ipratropium-albuterol  3 mL Nebulization TID  . methylPREDNISolone (SOLU-MEDROL) injection  60 mg Intravenous Q6H  . metoprolol tartrate  50 mg Oral BID  . multivitamin  1 tablet Oral QHS  . mupirocin ointment  1 application Nasal BID  . pantoprazole  40 mg Oral BID AC  . sodium chloride flush  3 mL Intravenous Q12H

## 2015-08-17 NOTE — Progress Notes (Signed)
Patient asking for a wheelchair for home. MD paged.

## 2015-08-18 DIAGNOSIS — I5032 Chronic diastolic (congestive) heart failure: Secondary | ICD-10-CM

## 2015-08-18 LAB — GLUCOSE, CAPILLARY
GLUCOSE-CAPILLARY: 237 mg/dL — AB (ref 65–99)
GLUCOSE-CAPILLARY: 264 mg/dL — AB (ref 65–99)
Glucose-Capillary: 182 mg/dL — ABNORMAL HIGH (ref 65–99)
Glucose-Capillary: 192 mg/dL — ABNORMAL HIGH (ref 65–99)
Glucose-Capillary: 255 mg/dL — ABNORMAL HIGH (ref 65–99)

## 2015-08-18 LAB — CULTURE, RESPIRATORY W GRAM STAIN

## 2015-08-18 LAB — CULTURE, RESPIRATORY

## 2015-08-18 MED ORDER — CIPROFLOXACIN HCL 500 MG PO TABS
500.0000 mg | ORAL_TABLET | Freq: Every day | ORAL | Status: AC
  Administered 2015-08-18: 500 mg via ORAL
  Filled 2015-08-18: qty 1

## 2015-08-18 MED ORDER — METHYLPREDNISOLONE SODIUM SUCC 40 MG IJ SOLR
40.0000 mg | Freq: Four times a day (QID) | INTRAMUSCULAR | Status: DC
  Administered 2015-08-18 – 2015-08-19 (×4): 40 mg via INTRAVENOUS
  Filled 2015-08-18 (×4): qty 1

## 2015-08-18 MED ORDER — CIPROFLOXACIN HCL 500 MG PO TABS: 500.0000 mg | ORAL_TABLET | Freq: Every day | ORAL | Status: DC

## 2015-08-18 NOTE — Progress Notes (Signed)
[facility] Progress Note  Dialysis Orders: Center: NW 4 hr 180 400/800 EDW 73.5 2 K 2.25 Ca right IJ heparin 8600 mircera 225 q 2 weeks - just given 2/15- on hold now due to probably false high hgb of 13 but needs a taper at d/c venofer none Hectorol 1 Recent labs: Hgb 10.6 2/22 iPTH 85 corr Ca 7.7 P 2  Assessment/Plan: 1. Recurrenent acute on chronic resp failure/exacerabation of COPD - meds per primary - tapering IV steroids. Sputum Cx+ pseudomonas. Better, prob for dc tomorrow 2. ESRD - MWF on HD - next HD Monday; heparin dose needs to be lowered at d/c 3. Hypertension/volume - BP's low here, at dry wt on norvasc 5/d.  Will dc norvasc.  4. Anemia - hgb 10.7 - previously Mircera 226 q 2 weeks held at max dose -last received 2/15 - on hectorol and daily vit d 5. Metabolic bone disease - Continue hectorol - last outpt P was 2 - will discontinue renvela for now 6. Nutrition- renal carb mod diet/vit 7. Afib - on MTP 50 bid, no anticoagulation due to hx of GIB  8. Contact precautions 9. Dispo - possible dc Monday after HD   Kelly Splinter MD [facility]     pager [phone]    cell [phone] 08/18/2015, 3:10 PM    Subjective:   Nothing new  Objective Filed Vitals:   08/18/15 0537 08/18/15 0625 08/18/15 0951 08/18/15 1317  BP:  120/56    Pulse:  92    Temp:  97.5 F (36.4 C)    TempSrc:  Oral    Resp:  20    Height:      Weight: 73.1 kg (161 lb 2.5 oz)     SpO2:  98% 98% 97%   Physical Exam General: NAD Heart: RRR Lungs: dim BS poor expansion Abdomen: soft NT Extremities: no LE edema Dialysis Access: right IJ and maturing right upper AVF  Additional Objective Labs: Basic Metabolic Panel:  Recent Labs Lab 08/14/15 2255 08/15/15 0856 08/16/15 0733  NA 132* 133* 129*  K 4.5 3.7 4.2  CL 92* 92* 92*  CO2 26 23 25   GLUCOSE 239* 242* 182*  BUN 11 20 39*  CREATININE 2.68* 3.76* 4.95*  CALCIUM 8.8* 8.9 8.8*   Liver Function  Tests:  Recent Labs Lab 08/14/15 2255 08/15/15 0856  AST 39 36  ALT 14* 14*  ALKPHOS 63 59  BILITOT 1.0 0.2*  PROT 7.5 7.2  ALBUMIN 2.9* 2.8*  CBC:  Recent Labs Lab 08/14/15 2255 08/15/15 0856 08/16/15 0733  WBC 7.0 6.8 10.1  NEUTROABS 6.5 5.9  --   HGB 11.7* 11.0* 10.7*  HCT 38.4* 36.3* 34.1*  MCV 91.9 92.1 92.2  PLT 276 264 275   Blood Culture    Component Value Date/Time   SDES URINE, CLEAN CATCH 08/16/2015 0655   SPECREQUEST NONE 08/16/2015 0655   CULT  08/16/2015 0655    60,000 COLONIES/ml KLEBSIELLA PNEUMONIAE REPEATING SENSITIVITIES TO CONFIRM RESISTANT SUSCEPTIBILITY PATTERN    REPTSTATUS PENDING 08/16/2015 0655    Cardiac Enzymes:  Recent Labs Lab 08/15/15 0856 08/15/15 1530 08/15/15 2035  TROPONINI <0.03 <0.03 0.03   CBG:  Recent Labs Lab 08/17/15 1225 08/17/15 1655 08/17/15 2219 08/18/15 0843 08/18/15 1123  GLUCAP 236* 167* 274* 182* 237*   Studies/Results: No results found. Medications:   . amLODipine  5 mg Oral QHS  . atorvastatin  20 mg Oral Daily  . Chlorhexidine Gluconate Cloth  6 each  Topical Q0600  . cholecalciferol  1,000 Units Oral Daily  . [START ON 08/19/2015] ciprofloxacin  500 mg Oral Q2000  . [START ON 08/21/2015] darbepoetin (ARANESP) injection - DIALYSIS  100 mcg Intravenous Q Wed-HD  . doxercalciferol  1 mcg Intravenous Q M,W,F-HD  . feeding supplement (NEPRO CARB STEADY)  237 mL Oral BID AC  . guaiFENesin  1,200 mg Oral BID  . heparin  5,000 Units Subcutaneous 3 times per day  . insulin aspart  0-9 Units Subcutaneous TID WC  . ipratropium-albuterol  3 mL Nebulization TID  . methylPREDNISolone (SOLU-MEDROL) injection  40 mg Intravenous Q6H  . metoprolol tartrate  50 mg Oral BID  . multivitamin  1 tablet Oral QHS  . mupirocin ointment  1 application Nasal BID  . pantoprazole  40 mg Oral BID AC  . sodium chloride flush  3 mL Intravenous Q12H

## 2015-08-18 NOTE — Progress Notes (Signed)
[facility] PROGRESS NOTE  Terry [name] [id number] DOB: 1935-03-21 DOA: 08/14/2015 PCP: [name], DO  Assessment/Plan: 1. Acute on chronic respiratory failure- likely multifactorial from COPD exacerbation, pulmonary edema. Patient is breathing better after starting Solu-Medrol, DuoNeb nebulizers, Mucinex, Zithromax. Sputum culture is growing Pseudomonas aeruginosa. Will change the antibiotics to ciprofloxacin. Patient has allergy to Levaquin but says that he took Cipro in the past without any problem. Will taper off Solu-Medrol to 40 mg IV every 6 hours. 2. ESRD on hemodialysis- patient followed by nephrology. Hemodialysis in a.m. 3. ? UTI- patient had abnormal UA and started on Rocephin empirically. Urine culture is still pending. Will discontinue Rocephin as he started on ciprofloxacin as above. Will follow urine culture results. 4. Diabetes mellitus- continue sliding scale insulin with NovoLog. 5. Chronic atrial fibrillation- hearted controlled, chads VASC score is more than 2, patient not on anticoagulation due to history of GI bleed. 6. Hypertension- blood pressure is controlled, continue amlodipine 7. CAD status post stenting- no chest pain. Stable  Code Status: Full code Family Communication: Discussed with wife at bedside on 08/15/2015 Disposition Plan: Pending improvement in respiratory status   Consultants:  Nephrology  Procedures:  None  Antibiotics:  Zithromax  HPI/Subjective: 81 y.o. male who was recently admitted and discharged 2 weeks ago after being treated for pneumonia was brought to the ER after patient was found to be short of breath and hypoxic after dialysis yesterday. Patient denies any chest pain but has been having productive cough. Denies any nausea vomiting or diarrhea. Patient chest x-ray is unremarkable. EKG shows A. fib with rate control. On exam patient has minimal wheezing. Patient has been hypoxic and has been placed on oxygen.    Patient's breathing has improved today. Sputum culture is growing pseudomonas aeruginosa  Objective: Filed Vitals:   08/17/15 2221 08/18/15 0625  BP: 117/48 120/56  Pulse: 95 92  Temp: 98 F (36.7 C) 97.5 F (36.4 C)  Resp: 20 20    Intake/Output Summary (Last 24 hours) at 08/18/15 1332 Last data filed at 08/17/15 1814  Gross per 24 hour  Intake    722 ml  Output      0 ml  Net    722 ml   Filed Weights   08/16/15 0711 08/17/15 0600 08/18/15 0537  Weight: 74.5 kg (164 lb 3.9 oz) 71.5 kg (157 lb 10.1 oz) 73.1 kg (161 lb 2.5 oz)    Exam:   General:  Appears in no acute distress  Cardiovascular: S1-S2 regular  Respiratory: Bilateral wheezing  Abdomen: Soft, nontender, no organomegaly  Musculoskeletal: No cyanosis/clubbing/edema of the lower extremities   Data Reviewed: Basic Metabolic Panel:  Recent Labs Lab 08/14/15 2255 08/15/15 0856 08/16/15 0733  NA 132* 133* 129*  K 4.5 3.7 4.2  CL 92* 92* 92*  CO2 26 23 25   GLUCOSE 239* 242* 182*  BUN 11 20 39*  CREATININE 2.68* 3.76* 4.95*  CALCIUM 8.8* 8.9 8.8*   Liver Function Tests:  Recent Labs Lab 08/14/15 2255 08/15/15 0856  AST 39 36  ALT 14* 14*  ALKPHOS 63 59  BILITOT 1.0 0.2*  PROT 7.5 7.2  ALBUMIN 2.9* 2.8*   CBC:  Recent Labs Lab 08/14/15 2255 08/15/15 0856 08/16/15 0733  WBC 7.0 6.8 10.1  NEUTROABS 6.5 5.9  --   HGB 11.7* 11.0* 10.7*  HCT 38.4* 36.3* 34.1*  MCV 91.9 92.1 92.2  PLT 276 264 275   Cardiac Enzymes:  Recent Labs Lab 08/15/15 0856  08/15/15 1530 08/15/15 2035  TROPONINI <0.03 <0.03 0.03   BNP (last 3 results)  Recent Labs  05/13/15 2017 06/08/15 0016 07/28/15 2135  BNP 362.1* 892.3* 432.6*    ProBNP (last 3 results) No results for input(s): PROBNP in the last 8760 hours.  CBG:  Recent Labs Lab 08/17/15 1225 08/17/15 1655 08/17/15 2219 08/18/15 0843 08/18/15 1123  GLUCAP 236* 167* 274* 182* 237*    Recent Results (from the past 240  hour(s))  Culture, blood (routine x 2)     Status: None (Preliminary result)   Collection Time: 08/14/15 10:55 PM  Result Value Ref Range Status   Specimen Description BLOOD BLOOD LEFT WRIST  Final   Special Requests BOTTLES DRAWN AEROBIC AND ANAEROBIC 5ML  Final   Culture NO GROWTH 3 DAYS  Final   Report Status PENDING  Incomplete  Culture, blood (routine x 2)     Status: None (Preliminary result)   Collection Time: 08/14/15 11:08 PM  Result Value Ref Range Status   Specimen Description BLOOD LEFT ARM  Final   Special Requests BOTTLES DRAWN AEROBIC AND ANAEROBIC 5ML  Final   Culture NO GROWTH 3 DAYS  Final   Report Status PENDING  Incomplete  Culture, expectorated sputum-assessment     Status: None   Collection Time: 08/15/15  7:50 AM  Result Value Ref Range Status   Specimen Description EXPECTORATED SPUTUM  Final   Special Requests Normal  Final   Sputum evaluation   Final    MICROSCOPIC FINDINGS SUGGEST THAT THIS SPECIMEN IS NOT REPRESENTATIVE OF LOWER RESPIRATORY SECRETIONS. PLEASE RECOLLECT. Gram Stain Report Called to,Read Back By and Verified With: C CARLAN,RN AT 1009 08/15/15 BY L BENFIELD    Report Status 08/15/2015 FINAL  Final  Culture, respiratory (NON-Expectorated)     Status: None   Collection Time: 08/15/15  4:28 PM  Result Value Ref Range Status   Specimen Description SPUTUM  Final   Special Requests NONE  Final   Gram Stain   Final    ABUNDANT WBC PRESENT,BOTH PMN AND MONONUCLEAR MODERATE SQUAMOUS EPITHELIAL CELLS PRESENT ABUNDANT GRAM NEGATIVE RODS MODERATE GRAM POSITIVE COCCI IN PAIRS THIS SPECIMEN IS ACCEPTABLE FOR SPUTUM CULTURE Performed at [facility]    Culture   Final    ABUNDANT PSEUDOMONAS AERUGINOSA Performed at [facility]    Report Status 08/18/2015 FINAL  Final   Organism ID, Bacteria PSEUDOMONAS AERUGINOSA  Final      Susceptibility   Pseudomonas aeruginosa - MIC*    CEFEPIME <=1 SENSITIVE Sensitive     CEFTAZIDIME 2  SENSITIVE Sensitive     CIPROFLOXACIN <=0.25 SENSITIVE Sensitive     GENTAMICIN <=1 SENSITIVE Sensitive     IMIPENEM 1 SENSITIVE Sensitive     PIP/TAZO 8 SENSITIVE Sensitive     TOBRAMYCIN <=1 SENSITIVE Sensitive     * ABUNDANT PSEUDOMONAS AERUGINOSA  MRSA PCR Screening     Status: Abnormal   Collection Time: 08/16/15 12:24 AM  Result Value Ref Range Status   MRSA by PCR POSITIVE (A) NEGATIVE Final    Comment:        The GeneXpert MRSA Assay (FDA approved for NASAL specimens only), is one component of a comprehensive MRSA colonization surveillance program. It is not intended to diagnose MRSA infection nor to guide or monitor treatment for MRSA infections. RESULT CALLED TO, READ BACK BY AND VERIFIED WITH: M RUDISILL,RN @0217  08/16/15 MKELLY   Urine culture     Status: None (Preliminary result)  Collection Time: 08/16/15  6:55 AM  Result Value Ref Range Status   Specimen Description URINE, CLEAN CATCH  Final   Special Requests NONE  Final   Culture   Final    60,000 COLONIES/ml KLEBSIELLA PNEUMONIAE REPEATING SENSITIVITIES TO CONFIRM RESISTANT SUSCEPTIBILITY PATTERN    Report Status PENDING  Incomplete     Studies: No results found.  Scheduled Meds: . amLODipine  5 mg Oral QHS  . atorvastatin  20 mg Oral Daily  . Chlorhexidine Gluconate Cloth  6 each Topical Q0600  . cholecalciferol  1,000 Units Oral Daily  . [START ON 08/19/2015] ciprofloxacin  500 mg Oral Q2000  . [START ON 08/21/2015] darbepoetin (ARANESP) injection - DIALYSIS  100 mcg Intravenous Q Wed-HD  . doxercalciferol  1 mcg Intravenous Q M,W,F-HD  . feeding supplement (NEPRO CARB STEADY)  237 mL Oral BID AC  . guaiFENesin  1,200 mg Oral BID  . heparin  5,000 Units Subcutaneous 3 times per day  . insulin aspart  0-9 Units Subcutaneous TID WC  . ipratropium-albuterol  3 mL Nebulization TID  . methylPREDNISolone (SOLU-MEDROL) injection  60 mg Intravenous Q6H  . metoprolol tartrate  50 mg Oral BID  .  multivitamin  1 tablet Oral QHS  . mupirocin ointment  1 application Nasal BID  . pantoprazole  40 mg Oral BID AC  . sodium chloride flush  3 mL Intravenous Q12H   Continuous Infusions:   Active Problems:   CAD (coronary artery disease) of artery bypass graft   COPD exacerbation (HCC)   Acute respiratory failure with hypoxia (HCC)   Chronic atrial fibrillation (HCC)   Chronic diastolic heart failure (HCC)   Anemia of chronic disease   ESRD (end stage renal disease) on dialysis ([place])    Time spent: *25 min    [facility] Pager [phone]. If 7PM-7AM, please contact night-coverage at [email or web address], password [facility] 08/18/2015, 1:32 PM  LOS: 3 days

## 2015-08-19 LAB — RENAL FUNCTION PANEL
ALBUMIN: 2.8 g/dL — AB (ref 3.5–5.0)
ANION GAP: 20 — AB (ref 5–15)
BUN: 100 mg/dL — ABNORMAL HIGH (ref 6–20)
CALCIUM: 8.2 mg/dL — AB (ref 8.9–10.3)
CO2: 19 mmol/L — ABNORMAL LOW (ref 22–32)
Chloride: 91 mmol/L — ABNORMAL LOW (ref 101–111)
Creatinine, Ser: 6.32 mg/dL — ABNORMAL HIGH (ref 0.61–1.24)
GFR calc Af Amer: 9 mL/min — ABNORMAL LOW (ref >60)
GFR, EST NON AFRICAN AMERICAN: 8 mL/min — AB (ref >60)
GLUCOSE: 216 mg/dL — AB (ref 65–99)
PHOSPHORUS: 6.7 mg/dL — AB (ref 2.5–4.6)
Potassium: 4.3 mmol/L (ref 3.5–5.1)
SODIUM: 130 mmol/L — AB (ref 135–145)

## 2015-08-19 LAB — GLUCOSE, CAPILLARY: GLUCOSE-CAPILLARY: 150 mg/dL — AB (ref 65–99)

## 2015-08-19 LAB — CBC
HEMATOCRIT: 34.7 % — AB (ref 39.0–52.0)
HEMOGLOBIN: 11.1 g/dL — AB (ref 13.0–17.0)
MCH: 27.7 pg (ref 26.0–34.0)
MCHC: 32 g/dL (ref 30.0–36.0)
MCV: 86.5 fL (ref 78.0–100.0)
Platelets: 298 10*3/uL (ref 150–400)
RBC: 4.01 MIL/uL — AB (ref 4.22–5.81)
RDW: 18 % — ABNORMAL HIGH (ref 11.5–15.5)
WBC: 14.1 10*3/uL — AB (ref 4.0–10.5)

## 2015-08-19 LAB — URINE CULTURE

## 2015-08-19 MED ORDER — DOXERCALCIFEROL 4 MCG/2ML IV SOLN
INTRAVENOUS | Status: AC
  Filled 2015-08-19: qty 2

## 2015-08-19 MED ORDER — GUAIFENESIN ER 600 MG PO TB12: 1200.0000 mg | ORAL_TABLET | Freq: Two times a day (BID) | ORAL | Status: DC

## 2015-08-19 MED ORDER — PREDNISONE 10 MG PO TABS: 10.0000 mg | ORAL_TABLET | Freq: Every day | ORAL | Status: DC

## 2015-08-19 MED ORDER — IPRATROPIUM-ALBUTEROL 0.5-2.5 (3) MG/3ML IN SOLN: 3.0000 mL | Freq: Four times a day (QID) | RESPIRATORY_TRACT | Status: DC | PRN

## 2015-08-19 NOTE — Progress Notes (Signed)
Patient discharge teaching given, including activity, diet, follow-up appoints, and medications. Patient verbalized understanding of all discharge instructions. IV access was d/c'd. Vitals are stable. Skin is intact except as charted in most recent assessments. Pt to be escorted out by NT, to be driven home by family. 

## 2015-08-19 NOTE — Progress Notes (Signed)
   08/19/15 1408  PT Visit Information  Assistance Needed +1  Reason Eval/Treat Not Completed Patient declined, no reason specified (reports he wants to conserve his energy to get in the car later.  Pt reports he as no further questions at this time.  )

## 2015-08-19 NOTE — Care Management Note (Signed)
Case Management Note  Patient Details  Name: Terry [name] MRN: [id number] Date of Birth: 12/16/1934  Subjective/Objective:               Spoke to patient at the bedside. He states that he has no barriers getting his meds, that he has 2-3 walkers, a wheelchair with a gel seat, oxygen and a nebulizer. He states that his wife will drive him to HD. He has been referred to [facility] for [facility].      Action/Plan:  No further CM needs identified.  Expected Discharge Date:                  Expected Discharge Plan:  [place]  In-House Referral:     Discharge planning Services  CM Consult  Post Acute Care Choice:  Durable Medical Equipment Choice offered to:     DME Arranged:  Walker rolling with seat DME Agency:  [place]:  PT [facility] Agency:  [place]  Status of Service:  In process, will continue to follow  Medicare Important Message Given:    Date Medicare IM Given:    Medicare IM give by:    Date Additional Medicare IM Given:    Additional Medicare Important Message give by:     If discussed at [place] of Stay Meetings, dates discussed:    Additional Comments:  Carles Collet, RN 08/19/2015, 12:11 PM

## 2015-08-19 NOTE — Progress Notes (Signed)
Inpatient Diabetes Program Recommendations  AACE/ADA: New Consensus Statement on Inpatient Glycemic Control (2015)  Target Ranges:  Prepandial:   less than 140 mg/dL      Peak postprandial:   less than 180 mg/dL (1-2 hours)      Critically ill patients:  140 - 180 mg/dL  Results for Terry [name], Terry [name] (MRN [id number]) as of 08/19/2015 11:09  Ref. Range 08/19/2015 07:49  Glucose Latest Ref Range: 65-99 mg/dL 216 (H)   Results for Terry [name], Terry [name] (MRN [id number]) as of 08/19/2015 11:09  Ref. Range 08/18/2015 08:43 08/18/2015 11:23 08/18/2015 17:51 08/18/2015 21:41 08/18/2015 21:54  Glucose-Capillary Latest Ref Range: 65-99 mg/dL 182 (H) 237 (H) 192 (H) 255 (H) 264 (H)   Review of Glycemic Control  Current orders for Inpatient glycemic control: Novolog 0-9 units TID with meals  Inpatient Diabetes Program Recommendations: Insulin - Basal: If patient is continued on steroids as ordered (Solumedrol 40 mg Q6H), please consider ordering low dose basal insulin. Recommend starting with Levemir 5 units QHS. Please note that if basal insulin is ordered as recommended, it will need to be adjusted as steroids are tapered. Correction (SSI): Please consider ordering Novolog BEDTIME correction scale.  Thanks, Barnie Alderman, RN, MSN, CDE Diabetes Coordinator Inpatient Diabetes Program [phone] (Team Pager from [place] to [place]) [phone] ([facility] office) [phone] [facility] office) [phone] [facility] office)

## 2015-08-19 NOTE — Procedures (Signed)
I was present at this dialysis session. I have reviewed the session itself and made appropriate changes.   For dc today.  Resp better.  To go home on PO Cipro for pseumodonas resp cx. Standign weight today to see if EDW needs to change.   Pearson Grippe  MD 08/19/2015, 9:27 AM

## 2015-08-19 NOTE — Discharge Summary (Signed)
Physician Discharge Summary  Terry [name] [id number] DOB: 28-Dec-1934 DOA: 08/14/2015  PCP: Colin Benton R., DO  Admit date: 08/14/2015 Discharge date: 08/19/2015  Time spent: *25 minutes  Recommendations for Outpatient Follow-up:  1. Follow-up pulmonary Dr. Melvyn Novas in 2 weeks   Discharge Diagnoses:  Active Problems:   CAD (coronary artery disease) of artery bypass graft   COPD exacerbation (HCC)   Acute respiratory failure with hypoxia (HCC)   Chronic atrial fibrillation (HCC)   Chronic diastolic heart failure (HCC)   Anemia of chronic disease   ESRD (end stage renal disease) on dialysis [facility])   Discharge Condition: Stable  Diet recommendation: Carb modified diet  Filed Weights   08/19/15 0446 08/19/15 0746 08/19/15 1122  Weight: 73.982 kg (163 lb 1.6 oz) 74.7 kg (164 lb 10.9 oz) 72.2 kg (159 lb 2.8 oz)    History of present illness:  81 y.o. male who was recently admitted and discharged 2 weeks ago after being treated for pneumonia was brought to the ER after patient was found to be short of breath and hypoxic after dialysis yesterday. Patient denies any chest pain but has been having productive cough. Denies any nausea vomiting or diarrhea. Patient chest x-ray is unremarkable. EKG shows A. fib with rate control. On exam patient has minimal wheezing. Patient has been hypoxic and has been placed on oxygen.   Hospital Course:  1. Acute on chronic respiratory failure- likely multifactorial from COPD exacerbation, pulmonary edema. Patient is breathing better after starting Solu-Medrol, DuoNeb nebulizers, Mucinex, Zithromax. Sputum culture is growing Pseudomonas aeruginosa. Patient was started on ciprofloxacin. I called and discussed with ID Dr. Linus Salmons, who does not recommend to treat as patient did not have pneumonia and improved with the steroids which goes more along with COPD exacerbation and pneumonia. Chest x-ray also did not show pneumonia. At this time patient is back to  baseline. Currently on nasal cannula 4 L oxygen with O2 sats greater than 94%  2. COPD exacerbation - patient was treated as above. I discussed with Dr. Melvyn Novas (curb side consult), who recommended to continue patient on long-term prednisone and he will follow the patient in the clinic. I will send the patient on prednisone taper and then after 6 days and continue taking prednisone 10 mg daily.  3. ESRD on hemodialysis- patient followed by nephrology. Hemodialysis in a.m.Patient was seen by nephrology and underwent hematemesis today.  4. Asymptomatic bacteriuria - patient had mildly abnormal UA on admission, not have symptoms of dysuria. Urine culture was obtained which showed 60,000 colonies of Klebsiella pneumoniae, CARBAPENEMASE RESISTANT ENTEROBACTERIACAE.I called and discussed with infectious disease on phone Dr Linus Salmons, who recommends not to treat as patient is asymptomatic. Will not give any more antibiotics.  5. Chronic atrial fibrillation- heart rate  controlled, chads VASC score is more than 2, patient not on anticoagulation due to history of GI bleed. 6. Hypertension- blood pressure is controlled, continue amlodipine 7. CAD status post stenting- no chest pain. Stable  Procedures:  *None    Consultation        None   Discharge Exam: Filed Vitals:   08/19/15 1122 08/19/15 1201  BP: 129/67 132/61  Pulse: 104 107  Temp: 97.3 F (36.3 C) 98.5 F (36.9 C)  Resp: 18 22    General: Appears in no acute distress Cardiovascular:S1-S2 regular Respiratory: Clear to auscultation bilaterally.   Discharge Instructions   Discharge Instructions    Diet - low sodium heart healthy    Complete by:  As directed      Increase activity slowly    Complete by:  As directed           Current Discharge Medication List    START taking these medications   Details  guaiFENesin (MUCINEX) 600 MG 12 hr tablet Take 2 tablets (1,200 mg total) by mouth 2 (two) times daily. Qty: 14 tablet, Refills: 0     predniSONE (DELTASONE) 10 MG tablet Take 1 tablet (10 mg total) by mouth daily with breakfast. Prednisone 40 mg po daily x 2 day then Prednisone 30 mg po daily x 2 day then Prednisone 20 mg po daily x 2 day then Prednisone 10 mg daily Qty: 60 tablet, Refills: 2      CONTINUE these medications which have CHANGED   Details  ipratropium-albuterol (DUONEB) 0.5-2.5 (3) MG/3ML SOLN Take 3 mLs by nebulization every 6 (six) hours as needed. Qty: 360 mL, Refills: 3      CONTINUE these medications which have NOT CHANGED   Details  albuterol (VENTOLIN HFA) 108 (90 BASE) MCG/ACT inhaler Inhale 2 puffs into the lungs every 4 (four) hours as needed for wheezing or shortness of breath. Qty: 1 Inhaler, Refills: 0    amLODipine (NORVASC) 10 MG tablet Take 1 tablet (10 mg total) by mouth daily. Qty: 90 tablet, Refills: 3    atorvastatin (LIPITOR) 20 MG tablet Take 1 tablet (20 mg total) by mouth daily. Qty: 30 tablet, Refills: 6    benzonatate (TESSALON) 200 MG capsule Take 1 capsule (200 mg total) by mouth 3 (three) times daily as needed for cough. Qty: 60 capsule, Refills: 0    budesonide-formoterol (SYMBICORT) 160-4.5 MCG/ACT inhaler Inhale 2 puffs into the lungs 2 (two) times daily. Qty: 1 Inhaler, Refills: 6    cholecalciferol (VITAMIN D) 1000 units tablet Take 1,000 Units by mouth daily.    HYDROcodone-homatropine (HYCODAN) 5-1.5 MG/5ML syrup Take 5 mLs by mouth every 6 (six) hours as needed for cough. Qty: 120 mL, Refills: 0    methocarbamol (ROBAXIN) 500 MG tablet Take 1 tablet (500 mg total) by mouth every 8 (eight) hours as needed for muscle spasms. Qty: 30 tablet, Refills: 0    metoprolol tartrate (LOPRESSOR) 25 MG tablet Take 2 tablets (50 mg total) by mouth 2 (two) times daily. 8am, 5pm Qty: 60 tablet, Refills: 3    Nutritional Supplements (NEPRO) LIQD Take 1 Can by mouth 2 (two) times daily before lunch and supper.    pantoprazole (PROTONIX) 40 MG tablet Take 1 tablet (40  mg total) by mouth 2 (two) times daily before a meal.    polyethylene glycol (MIRALAX) packet Take 17 g by mouth daily as needed for moderate constipation. Qty: 30 each, Refills: 0    senna-docusate (SENOKOT S) 8.6-50 MG tablet Take 2 tablets by mouth at bedtime as needed for mild constipation. Qty: 60 tablet, Refills: 3    sevelamer carbonate (RENVELA) 800 MG tablet Take 1 tablet (800 mg total) by mouth 3 (three) times daily with meals.    tiotropium (SPIRIVA HANDIHALER) 18 MCG inhalation capsule INHALE THE CONTENTS OF ONE CAPSULE INTO LUNGS ONCE DAILY Qty: 30 capsule, Refills: 3      STOP taking these medications     guaiFENesin-dextromethorphan (ROBITUSSIN DM) 100-10 MG/5ML syrup        Allergies  Allergen Reactions  . Yellow Dyes (Non-Tartrazine) Other (See Comments)    Burns arms Cough medications (tussionex and benzonatate ok)  . Levaquin [Levofloxacin In D5w] Itching  .  Tussionex Pennkinetic Er [Hydrocod Polst-Cpm Polst Er] Other (See Comments)    Unknown on MAR   Follow-up Information    Follow up with [facility].   Why:  rollator   Contact information:   [address] [place] [address] [phone]       Follow up with [facility].   Why:  home health physical therapy   Contact information:   [address] [place] [address] [phone]        The results of significant diagnostics from this hospitalization (including imaging, microbiology, ancillary and laboratory) are listed below for reference.    Significant Diagnostic Studies: Dg Chest 2 View  08/14/2015  CLINICAL DATA:  Shortness of breath and cough for 1 day. EXAM: CHEST  2 VIEW COMPARISON:  07/31/2015 FINDINGS: Hyperexpansion is consistent with emphysema. Stable bibasilar linear densities, likely atelectatic although superimposed pneumonia not entirely excluded. . No focal airspace consolidation or pulmonary edema. No pleural effusion. The  cardio pericardial silhouette is enlarged. Right IJ catheter tip projects at the SVC/ RA junction. The visualized bony structures of the thorax are intact. IMPRESSION: Stable exam.  Emphysema with cardiomegaly and bibasilar atelectasis. Electronically Signed   By: Misty Stanley M.D.   On: 08/14/2015 23:58   Dg Chest 2 View  07/31/2015  CLINICAL DATA:  Shortness of breath and wheezing EXAM: CHEST  2 VIEW COMPARISON:  07/28/2015 FINDINGS: Cardiac shadow is stable. A dialysis catheter is again seen. Persistent right basilar infiltrate is seen. No new focal abnormality is noted. Small amount of pleural fluid is noted on the right as well. IMPRESSION: Persistent right basilar infiltrate with small pleural effusion. Electronically Signed   By: Inez Catalina M.D.   On: 07/31/2015 13:19   Nm Pulmonary Perf And Vent  08/15/2015  CLINICAL DATA:  Shortness of breath, history hypertension, end-stage renal disease on dialysis, hypertension, COPD, coronary artery disease EXAM: NUCLEAR MEDICINE VENTILATION - PERFUSION LUNG SCAN TECHNIQUE: Ventilation images were obtained in multiple projections using inhaled aerosol Tc-79m DTPA. Perfusion images were obtained in multiple projections after intravenous injection of Tc-37m MAA. RADIOPHARMACEUTICALS:  32 mCi technetium-15m DTPA aerosol inhalation and 4.1 millicuries 0000000 MAA IV COMPARISON:  None; radiographic correlation chest radiograph 08/14/2015 FINDINGS: Ventilation: Extensive central airway deposition of aerosol. Poor peripheral aerosol distribution to alveoli. Decreased ventilation seen in RIGHT upper lobe and to lesser degrees in LEFT upper and LEFT lower lobes. Perfusion: Small subsegmental perfusion defect RIGHT middle lobe, without definite ventilatory defect. Otherwise better perfusion than ventilation throughout the remaining lungs. No additional segmental or subsegmental perfusion defects identified. Chest radiograph:  RIGHT basilar atelectasis.  Underlying  COPD. IMPRESSION: Small subsegmental perfusion defect RIGHT middle lobe with normal ventilation. Markedly impaired ventilation throughout remaining lungs particularly RIGHT upper lobe and LEFT lower lobe. Findings represent a low probability for pulmonary embolism. Electronically Signed   By: Lavonia Dana M.D.   On: 08/15/2015 11:54   Dg Chest Port 1 View  07/28/2015  CLINICAL DATA:  Acute onset of dyspnea.  Initial encounter. EXAM: PORTABLE CHEST 1 VIEW COMPARISON:  Chest radiograph performed 07/01/2015 FINDINGS: The lungs are well expanded. Right basilar airspace opacity raises concern for pneumonia. No pleural effusion or pneumothorax is seen. The cardiomediastinal silhouette is borderline normal in size. A right-sided dual-lumen catheter is noted ending about the distal SVC. No acute osseous abnormalities are seen. IMPRESSION: Right basilar airspace opacity is concerning for pneumonia. Electronically Signed   By: Jacqulynn Cadet  Chang M.D.   On: 07/28/2015 21:47    Microbiology: Recent Results (from the past 240 hour(s))  Culture, blood (routine x 2)     Status: None (Preliminary result)   Collection Time: 08/14/15 10:55 PM  Result Value Ref Range Status   Specimen Description BLOOD BLOOD LEFT WRIST  Final   Special Requests BOTTLES DRAWN AEROBIC AND ANAEROBIC 5ML  Final   Culture NO GROWTH 3 DAYS  Final   Report Status PENDING  Incomplete  Culture, blood (routine x 2)     Status: None (Preliminary result)   Collection Time: 08/14/15 11:08 PM  Result Value Ref Range Status   Specimen Description BLOOD LEFT ARM  Final   Special Requests BOTTLES DRAWN AEROBIC AND ANAEROBIC 5ML  Final   Culture NO GROWTH 3 DAYS  Final   Report Status PENDING  Incomplete  Culture, expectorated sputum-assessment     Status: None   Collection Time: 08/15/15  7:50 AM  Result Value Ref Range Status   Specimen Description EXPECTORATED SPUTUM  Final   Special Requests Normal  Final   Sputum evaluation   Final     MICROSCOPIC FINDINGS SUGGEST THAT THIS SPECIMEN IS NOT REPRESENTATIVE OF LOWER RESPIRATORY SECRETIONS. PLEASE RECOLLECT. Gram Stain Report Called to,Read Back By and Verified With: C CARLAN,RN AT 1009 08/15/15 BY L BENFIELD    Report Status 08/15/2015 FINAL  Final  Culture, respiratory (NON-Expectorated)     Status: None   Collection Time: 08/15/15  4:28 PM  Result Value Ref Range Status   Specimen Description SPUTUM  Final   Special Requests NONE  Final   Gram Stain   Final    ABUNDANT WBC PRESENT,BOTH PMN AND MONONUCLEAR MODERATE SQUAMOUS EPITHELIAL CELLS PRESENT ABUNDANT GRAM NEGATIVE RODS MODERATE GRAM POSITIVE COCCI IN PAIRS THIS SPECIMEN IS ACCEPTABLE FOR SPUTUM CULTURE Performed at [facility]    Culture   Final    ABUNDANT PSEUDOMONAS AERUGINOSA Performed at [facility]    Report Status 08/18/2015 FINAL  Final   Organism ID, Bacteria PSEUDOMONAS AERUGINOSA  Final      Susceptibility   Pseudomonas aeruginosa - MIC*    CEFEPIME <=1 SENSITIVE Sensitive     CEFTAZIDIME 2 SENSITIVE Sensitive     CIPROFLOXACIN <=0.25 SENSITIVE Sensitive     GENTAMICIN <=1 SENSITIVE Sensitive     IMIPENEM 1 SENSITIVE Sensitive     PIP/TAZO 8 SENSITIVE Sensitive     TOBRAMYCIN <=1 SENSITIVE Sensitive     * ABUNDANT PSEUDOMONAS AERUGINOSA  MRSA PCR Screening     Status: Abnormal   Collection Time: 08/16/15 12:24 AM  Result Value Ref Range Status   MRSA by PCR POSITIVE (A) NEGATIVE Final    Comment:        The GeneXpert MRSA Assay (FDA approved for NASAL specimens only), is one component of a comprehensive MRSA colonization surveillance program. It is not intended to diagnose MRSA infection nor to guide or monitor treatment for MRSA infections. RESULT CALLED TO, READ BACK BY AND VERIFIED WITH: M RUDISILL,RN @0217  08/16/15 MKELLY   Urine culture     Status: None   Collection Time: 08/16/15  6:55 AM  Result Value Ref Range Status   Specimen Description URINE, CLEAN  CATCH  Final   Special Requests NONE  Final   Culture   Final    60,000 COLONIES/ml KLEBSIELLA PNEUMONIAE CARBAPENEMASE RESISTANT ENTEROBACTERIACAE RESULT CALLED TO, READ BACK BY AND VERIFIED WITH: S. SPEAGLE INFECTION CONTROL AT 1210 ON ID:[id number] BY  S. [place] TO BE A PAN RESISTANT ORGANISM AND SENSITIVITIES WERE REPEATED FOR VERIFICATION    Report Status 08/19/2015 FINAL  Final     Labs: Basic Metabolic Panel:  Recent Labs Lab 08/14/15 2255 08/15/15 0856 08/16/15 0733 08/19/15 0749  NA 132* 133* 129* 130*  K 4.5 3.7 4.2 4.3  CL 92* 92* 92* 91*  CO2 26 23 25  19*  GLUCOSE 239* 242* 182* 216*  BUN 11 20 39* 100*  CREATININE 2.68* 3.76* 4.95* 6.32*  CALCIUM 8.8* 8.9 8.8* 8.2*  PHOS  --   --   --  6.7*   Liver Function Tests:  Recent Labs Lab 08/14/15 2255 08/15/15 0856 08/19/15 0749  AST 39 36  --   ALT 14* 14*  --   ALKPHOS 63 59  --   BILITOT 1.0 0.2*  --   PROT 7.5 7.2  --   ALBUMIN 2.9* 2.8* 2.8*  CBC:  Recent Labs Lab 08/14/15 2255 08/15/15 0856 08/16/15 0733 08/19/15 0749  WBC 7.0 6.8 10.1 14.1*  NEUTROABS 6.5 5.9  --   --   HGB 11.7* 11.0* 10.7* 11.1*  HCT 38.4* 36.3* 34.1* 34.7*  MCV 91.9 92.1 92.2 86.5  PLT 276 264 275 298   Cardiac Enzymes:  Recent Labs Lab 08/15/15 0856 08/15/15 1530 08/15/15 2035  TROPONINI <0.03 <0.03 0.03   BNP: BNP (last 3 results)  Recent Labs  05/13/15 2017 06/08/15 0016 07/28/15 2135  BNP 362.1* 892.3* 432.6*     CBG:  Recent Labs Lab 08/18/15 1123 08/18/15 1751 08/18/15 2141 08/18/15 2154 08/19/15 1216  GLUCAP 237* 192* 255* 264* 150*     Signed:  Eleonore Chiquito S MD.  [facility] 08/19/2015, 2:12 PM

## 2015-08-20 ENCOUNTER — Ambulatory Visit: Payer: Medicare PPO | Admitting: Nurse Practitioner

## 2015-08-20 DIAGNOSIS — E1122 Type 2 diabetes mellitus with diabetic chronic kidney disease: Secondary | ICD-10-CM | POA: Diagnosis not present

## 2015-08-20 DIAGNOSIS — J9621 Acute and chronic respiratory failure with hypoxia: Secondary | ICD-10-CM | POA: Diagnosis not present

## 2015-08-20 DIAGNOSIS — D638 Anemia in other chronic diseases classified elsewhere: Secondary | ICD-10-CM | POA: Diagnosis not present

## 2015-08-20 DIAGNOSIS — I5032 Chronic diastolic (congestive) heart failure: Secondary | ICD-10-CM | POA: Diagnosis not present

## 2015-08-20 DIAGNOSIS — Z992 Dependence on renal dialysis: Secondary | ICD-10-CM | POA: Diagnosis not present

## 2015-08-20 DIAGNOSIS — N186 End stage renal disease: Secondary | ICD-10-CM | POA: Diagnosis not present

## 2015-08-20 DIAGNOSIS — J440 Chronic obstructive pulmonary disease with acute lower respiratory infection: Secondary | ICD-10-CM | POA: Diagnosis not present

## 2015-08-20 DIAGNOSIS — J189 Pneumonia, unspecified organism: Secondary | ICD-10-CM | POA: Diagnosis not present

## 2015-08-20 DIAGNOSIS — I482 Chronic atrial fibrillation: Secondary | ICD-10-CM | POA: Diagnosis not present

## 2015-08-20 DIAGNOSIS — I25709 Atherosclerosis of coronary artery bypass graft(s), unspecified, with unspecified angina pectoris: Secondary | ICD-10-CM | POA: Diagnosis not present

## 2015-08-20 LAB — CULTURE, BLOOD (ROUTINE X 2)
CULTURE: NO GROWTH
Culture: NO GROWTH

## 2015-08-21 DIAGNOSIS — J156 Pneumonia due to other aerobic Gram-negative bacteria: Secondary | ICD-10-CM | POA: Diagnosis not present

## 2015-08-21 DIAGNOSIS — J150 Pneumonia due to Klebsiella pneumoniae: Secondary | ICD-10-CM | POA: Diagnosis not present

## 2015-08-21 DIAGNOSIS — N186 End stage renal disease: Secondary | ICD-10-CM | POA: Diagnosis not present

## 2015-08-22 DIAGNOSIS — J189 Pneumonia, unspecified organism: Secondary | ICD-10-CM | POA: Diagnosis not present

## 2015-08-22 DIAGNOSIS — N186 End stage renal disease: Secondary | ICD-10-CM | POA: Diagnosis not present

## 2015-08-22 DIAGNOSIS — J440 Chronic obstructive pulmonary disease with acute lower respiratory infection: Secondary | ICD-10-CM | POA: Diagnosis not present

## 2015-08-22 DIAGNOSIS — I5032 Chronic diastolic (congestive) heart failure: Secondary | ICD-10-CM | POA: Diagnosis not present

## 2015-08-22 DIAGNOSIS — I482 Chronic atrial fibrillation: Secondary | ICD-10-CM | POA: Diagnosis not present

## 2015-08-22 DIAGNOSIS — I25709 Atherosclerosis of coronary artery bypass graft(s), unspecified, with unspecified angina pectoris: Secondary | ICD-10-CM | POA: Diagnosis not present

## 2015-08-22 DIAGNOSIS — J9621 Acute and chronic respiratory failure with hypoxia: Secondary | ICD-10-CM | POA: Diagnosis not present

## 2015-08-22 DIAGNOSIS — Z992 Dependence on renal dialysis: Secondary | ICD-10-CM | POA: Diagnosis not present

## 2015-08-22 DIAGNOSIS — D638 Anemia in other chronic diseases classified elsewhere: Secondary | ICD-10-CM | POA: Diagnosis not present

## 2015-08-23 ENCOUNTER — Telehealth: Payer: Self-pay | Admitting: *Deleted

## 2015-08-23 DIAGNOSIS — N186 End stage renal disease: Secondary | ICD-10-CM | POA: Diagnosis not present

## 2015-08-23 DIAGNOSIS — J150 Pneumonia due to Klebsiella pneumoniae: Secondary | ICD-10-CM | POA: Diagnosis not present

## 2015-08-23 DIAGNOSIS — J156 Pneumonia due to other aerobic Gram-negative bacteria: Secondary | ICD-10-CM | POA: Diagnosis not present

## 2015-08-23 NOTE — Telephone Encounter (Signed)
Terry [name] [phone]) called with [facility] to let Dr Maudie Mercury know the pt has declined PT and OT at this time.  States the pt told her he does not feel he can do this now since he is going to dialysis and she stated nursing will continue to follow the pt.

## 2015-08-23 NOTE — Telephone Encounter (Signed)
Please let them know, I did not order PT, OT for home health and have not seen the patient in some time. Would advise they notify any current treating physicians. Advise follow-up visit if any concerns or needs as have not seen him in some time.

## 2015-08-26 DIAGNOSIS — J150 Pneumonia due to Klebsiella pneumoniae: Secondary | ICD-10-CM | POA: Diagnosis not present

## 2015-08-26 DIAGNOSIS — N186 End stage renal disease: Secondary | ICD-10-CM | POA: Diagnosis not present

## 2015-08-26 DIAGNOSIS — J156 Pneumonia due to other aerobic Gram-negative bacteria: Secondary | ICD-10-CM | POA: Diagnosis not present

## 2015-08-27 ENCOUNTER — Encounter: Payer: Self-pay | Admitting: Internal Medicine

## 2015-08-27 ENCOUNTER — Telehealth: Payer: Self-pay | Admitting: *Deleted

## 2015-08-27 ENCOUNTER — Ambulatory Visit (INDEPENDENT_AMBULATORY_CARE_PROVIDER_SITE_OTHER): Payer: Medicare PPO | Admitting: Internal Medicine

## 2015-08-27 VITALS — BP 102/48 | HR 100 | Ht 67.0 in | Wt 163.2 lb

## 2015-08-27 DIAGNOSIS — J9621 Acute and chronic respiratory failure with hypoxia: Secondary | ICD-10-CM | POA: Diagnosis not present

## 2015-08-27 DIAGNOSIS — J440 Chronic obstructive pulmonary disease with acute lower respiratory infection: Secondary | ICD-10-CM | POA: Diagnosis not present

## 2015-08-27 DIAGNOSIS — J9611 Chronic respiratory failure with hypoxia: Secondary | ICD-10-CM

## 2015-08-27 DIAGNOSIS — J189 Pneumonia, unspecified organism: Secondary | ICD-10-CM | POA: Diagnosis not present

## 2015-08-27 DIAGNOSIS — Z992 Dependence on renal dialysis: Secondary | ICD-10-CM | POA: Diagnosis not present

## 2015-08-27 DIAGNOSIS — J449 Chronic obstructive pulmonary disease, unspecified: Secondary | ICD-10-CM

## 2015-08-27 DIAGNOSIS — I482 Chronic atrial fibrillation: Secondary | ICD-10-CM | POA: Diagnosis not present

## 2015-08-27 DIAGNOSIS — I25709 Atherosclerosis of coronary artery bypass graft(s), unspecified, with unspecified angina pectoris: Secondary | ICD-10-CM | POA: Diagnosis not present

## 2015-08-27 DIAGNOSIS — I5032 Chronic diastolic (congestive) heart failure: Secondary | ICD-10-CM | POA: Diagnosis not present

## 2015-08-27 DIAGNOSIS — N186 End stage renal disease: Secondary | ICD-10-CM | POA: Diagnosis not present

## 2015-08-27 DIAGNOSIS — D638 Anemia in other chronic diseases classified elsewhere: Secondary | ICD-10-CM | POA: Diagnosis not present

## 2015-08-27 MED ORDER — PREDNISONE 10 MG PO TABS: 10.0000 mg | ORAL_TABLET | Freq: Every day | ORAL | Status: DC

## 2015-08-27 MED ORDER — ALBUTEROL SULFATE HFA 108 (90 BASE) MCG/ACT IN AERS: 2.0000 | INHALATION_SPRAY | RESPIRATORY_TRACT | Status: DC | PRN

## 2015-08-27 NOTE — Telephone Encounter (Signed)
I called Malachy Mood and left a detailed message on her voicemail with the information below.

## 2015-08-27 NOTE — Progress Notes (Signed)
Subjective:    Patient ID: Terry [name], male    DOB: 10-12-1934  MRN: [id number]   Brief patient profile:  50 yowm quit smoking 2001 bothered by doe seemed better then worse in 2010 p "lung infection" and stayed on advair/spiriva  Combo since 2012 seemed better then admitted in [facility] with pna 10/09/12  in then transferred to [facility] NH x 3 weeks and discharged  On May 1st  2014 referred to pulmonary clinic 11/29/12  by [facility] for copd eval and proved to have GOLD III copd 01/26/13 with symptoms much better off ACEi.   HPI 11/29/2012 1st pulmonary ov/ Terry [name]/ on ACEi/ cc doe x maybe a block of walking but not back to baseline and able to do 5 steps and has proaire but not needing. Also persistent dry daytime cough x months >d/c ACE    Admission date: 04/12/2014 Discharge Date: 04/20/2014    Admission Diagnosis Chronic kidney disease, stage III (moderate) [N18.3] Hyperkalemia [E87.5] Left flank pain [R10.9] Cyst of left kidney [Q61.00] COPD mixed type [J44.9] Essential hypertension [I10] Abdominal pain [R10.9]   Discharge Diagnosis Chronic kidney disease, stage III (moderate) [N18.3] Hyperkalemia [E87.5] Left flank pain [R10.9] Cyst of left kidney [Q61.00] COPD mixed type [J44.9] Essential hypertension [I10] Abdominal pain [R10.9]   Principal Problem:  Left flank pain  COPD GOLD III  Chronic kidney disease, stage III (moderate)  CAD (coronary artery disease) of artery bypass graft  HTN (hypertension)  Hyperkalemia   Hospital Course  1. Left flank pain - due to hemorrhage and a renal cyst repeat CT confirms, seen by urologist D/W Terry [name] on 04-14-14, likely cystoscopy outpatient, will continue to follow closely his symptoms and renal function. CT showed some perinephric stranding on the left side, had ++ leukocytosis resolved on empiric IV Rocephin will switch to 4 more days of PO Ceftin on 04-20-14, follow with PCP, renal and urology. 2. Acute renal  failure on top of chronic disease stage IV. Baseline creatinine around 2. Also had some bladder outlet obstruction. Foley was placed, he was hydrated, ACE inhibitor and Lasix held. Renal function has steadily improved. Appreciate nephrology input. We will follow post discharge with nephrology and PCP. 3. Hyponatremia - Per renal. Improving with self diuresis. Repeat BMP in 3-4 days. 4. Hypertension. Stable on beta blocker and Norvasc continue. ACE and Lasix discontinued due to acute renal failure. 5. CAD. No acute issue. Continue beta blocker, hold aspirin for now.  6.Constipation. Placed on bowel regimen and monitor.  7. Shortness of breath. Likely due to combination of mild fluid overload and questionable COPD exacerbation, much improved. The fluids were held and he is being self diuresing, also after a trial of IV steroids. Still requiring 2 L of nasal cannula oxygen down to 5. Much improved after pulmonary toiletry, he be discharged with nebulizer treatments on a steroid taper and home oxygen. Steadily improving with ongoing self diuresis   05/28/2014 transition of care/ post hosp f/u ov/Terry [name] re:  Chief Complaint  Patient presents with  . HFU    Breathing has improved some, but not back to his normal  baseline. He gets SOB walking minimal distances.    still on advair/ saba twice daily helps some - no symptoms at rest or sleeping  rec Try symbicort 160  Take 2 puffs first thing in am and then another 2 puffs about 12 hours later.  Only use your albuterol as needed   08/20/2014 NP  Follow up COPD  Patient  returns for a 6 week follow-up. Patient reports that he has good days and bad. Patient was previously on [facility] after his last hospitalization and would like to try this inhaler instead of Advair He feels that he had improved control of his breathing on this He denies any chest pain, orthopnea, PND, increased leg swelling or hemoptysis. Chest x-ray last visit with COPD changes. No acute  process noted. rec May try Dulera 200 2 puffs Twice daily  , rinse after use.  Continue on Spiriva daily .    10/22/2014 f/u ov/Terry [name] re: GOLD III on dulera 200 2 bid and spiriva/ no need for saba / not using med calendar  Chief Complaint  Patient presents with  . Follow-up    Pt states that his breathing has improved since the last visit.   doe improves and now able to walk mailbox and back s stopping  = Terry [name]  rec Please schedule a follow up visit in 3 months but call sooner if needed with pfts and bring all inhalers with you     02/26/2015 f/u ov/Terry [name] re:  GOLD III/ no 02 on symb/ spiriva   Chief Complaint  Patient presents with  . Follow-up    Pt states that his breathing is unchanged. He is having increased cough x 3 wks- prod with white sputum.   No need for rescue rx Doe x Terry [name] = can walk nl pace, flat grade, can't hurry or go uphills or steps s sob  s 02  rec  No change in medications - your weight and kidney function are clearly an issue and I will let Terry [name] know about the HCO3 issue Let the symbicort and spiriva go out thru your nose  Only use your albuterol (ventolin) as a rescue medication   Please schedule a follow up visit in 3 months but call sooner if needed bring all inhalers with you and fill the spiriva respimat before the visit - late add: ? Bisoprolol instead of lopressor   Admit date: 08/14/2015 Discharge date: 08/19/2015  Time spent: *25 minutes  Recommendations for Outpatient Follow-up:  1. Follow-up pulmonary Terry [name] in 2 weeks   Discharge Diagnoses:  Active Problems:  CAD (coronary artery disease) of artery bypass graft  COPD exacerbation (HCC)  Acute respiratory failure with hypoxia (HCC)  Chronic atrial fibrillation (HCC)  Chronic diastolic heart failure (HCC)  Anemia of chronic disease  ESRD (end stage renal disease) on dialysis ([place])   08/27/2015  Extended post hosp /transition of care  f/u ov/Terry [name] re: recurrent admits/ now  chronic resp failure/HD dep CRI plus GOLD III rx symb/spriiva/neb saba Chief Complaint  Patient presents with  . Follow-up    Pt c/o occasional cough with white mucus. Pt denies wheeze/increased SOB/CP/tightness. Pt is on O2/2L, he states that on 3L it dries out his nose and burns. Pt also c/o low BP readings and wonders if some of his HTN meds need to be changed.   now sleeping in a recliner since admit 30-45 degrees due to sob/some noct cough but excess/ purulent sputum or mucus plugs    On prednisone with floor of   @ 10 mg since d/c 08/19/15    No obvious day to day or daytime variabilty or assoc  cp or chest tightness, subjective wheeze overt sinus or hb symptoms. No unusual exp hx or h/o childhood pna/ asthma or knowledge of premature birth.   Sleeping ok without nocturnal  or early am exacerbation  of respiratory  c/o's or need for noct saba. Also denies any obvious fluctuation of symptoms with weather or environmental changes or other aggravating or alleviating factors except as outlined above   Current Medications, Allergies, Complete Past Medical History, Past Surgical History, Family History, and Social History were reviewed in [employer] record.  ROS  The following are not active complaints unless bolded sore throat, dysphagia, dental problems, itching, sneezing,  nasal congestion or excess/ purulent secretions, ear ache,   fever, chills, sweats, unintended wt loss, pleuritic or exertional cp, hemoptysis,  orthopnea pnd or leg swelling, presyncope, palpitations, heartburn, abdominal pain, anorexia, nausea, vomiting, diarrhea  or change in bowel or urinary habits, change in stools or urine, dysuria,hematuria,  rash, arthralgias, visual complaints, headache, numbness weakness or ataxia or problems with walking or coordination,  change in mood/affect or memory.           Objective:   Physical Exam  Somber slt hoarse  W/c bound /02 bound wm nad   02/26/2015           210 >  08/27/2015 163 p HD  Wt Readings from Last 3 Encounters:  10/22/14 202 lb (91.627 kg)  08/28/14 203 lb 8 oz (92.307 kg)  08/20/14 205 lb 6.4 oz (93.169 kg)    Vital signs reviewed    HEENT mild turbinate edema.  Oropharynx no thrush or excess pnd or cobblestoning.  No JVD or cervical adenopathy. Mild accessory muscle hypertrophy. Trachea midline, nl thryroid. Chest  CTA bilaterally with distant bs, no wheeze at atll  . Regular rate and rhythm without murmur gallop or rub or increase P2 or edema.  Abd: obese/ soft/ no hsm, nl excursion. Ext warm without cyanosis or clubbing.       I personally reviewed images and agree with radiology impression as follows:  CXR:  08/14/15 Stable exam. Emphysema with cardiomegaly and bibasilar atelectasis.  Labs  reviewed:      Chemistry      Component Value Date/Time   NA 130* 08/19/2015 0749   K 4.3 08/19/2015 0749   CL 91* 08/19/2015 0749   CO2 19* 08/19/2015 0749   BUN 100* 08/19/2015 0749   CREATININE 6.32* 08/19/2015 0749   CREATININE 2.62* 05/25/2014 1711      Component Value Date/Time   CALCIUM 8.2* 08/19/2015 0749   ALKPHOS 59 08/15/2015 0856   AST 36 08/15/2015 0856   ALT 14* 08/15/2015 0856   BILITOT 0.2* 08/15/2015 0856        Lab Results  Component Value Date   WBC 14.1* 08/19/2015   HGB 11.1* 08/19/2015   HCT 34.7* 08/19/2015   MCV 86.5 08/19/2015   PLT 298 08/19/2015       Lab Results  Component Value Date   TSH 2.383 08/15/2015                Assessment & Plan:

## 2015-08-27 NOTE — Telephone Encounter (Signed)
-----   Message from [name], DO sent at 08/27/2015 12:48 PM EST ----- Wendie Simmer, can you call the lab to see what "QNSRP" means on this result? This was ordered in the hospital and the report was sent to me. Please let me know. Thanks.   ----- Message -----    From: Lab In [place]: 08/20/2015   2:50 PM      To: [name], DO

## 2015-08-27 NOTE — Patient Instructions (Addendum)
May need to consider adding Oracit to your regimen and I will le Dr Lorrene Reid know  You will need to adjust your 02 to saturations of over 90%   Best fit and humidified 02 ordered   Plan A = Automatic = Symbicort 160 2 in am and 2 in pm with spiriva each am and prednisone 10 mg with breakfast daily   Work on inhaler technique:  relax and gently blow all the way out then take a nice smooth deep breath back in, triggering the inhaler at same time you start breathing in.  Hold for up to 5 seconds if you can. Blow out thru nose. Rinse and gargle with water when done     Plan B = Backup Only use your albuterol (proventil) as a rescue medication to be used if you can't catch your breath by resting or doing a relaxed purse lip breathing pattern.  - The less you use it, the better it will work when you need it. - Ok to use up to 2 puffs  every 4 hours if you must but call for appointment if use goes up over your usual need - Don't leave home without it !!  (think of it like the spare tire for your car)    Plan C = Crisis - only use your albuterol nebulizer if you first try Plan B and it fails to help > ok to use the nebulizer up to every 4 hours but if start needing it regularly call for immediate appointment   Plan D = Doctor - call me if B and C not adequate  Plan E = ER - go to ER or call 911 if all else fails      Please schedule a follow up office visit in 4 weeks, call sooner if needed

## 2015-08-27 NOTE — Telephone Encounter (Signed)
I called [facility] and spoke with [facility] and she stated this means when the tech received the test a repeat was needed but the quantity was not sufficient for analysis and they were unable to repeat the test.

## 2015-08-28 ENCOUNTER — Encounter: Payer: Self-pay | Admitting: Internal Medicine

## 2015-08-28 DIAGNOSIS — J156 Pneumonia due to other aerobic Gram-negative bacteria: Secondary | ICD-10-CM | POA: Diagnosis not present

## 2015-08-28 DIAGNOSIS — N186 End stage renal disease: Secondary | ICD-10-CM | POA: Diagnosis not present

## 2015-08-28 DIAGNOSIS — J150 Pneumonia due to Klebsiella pneumoniae: Secondary | ICD-10-CM | POA: Diagnosis not present

## 2015-08-28 NOTE — Assessment & Plan Note (Addendum)
-   Trial off ACEi 11/30/2012 >>> much better symptom control - 05/28/2014   Walked RA x one lap @ 185 stopped due to  [employer] / sats ok/ mod pace - PFT's 01/26/13      FEV1  1.28 (48%) ratio 48 and no better p B2,  DLCO 39 corrects to 48%  - PFTs 02/26/2015   FEV1 0.99  (35 % ) ratio 35  p 11 % improvement from saba with DLCO  24 % corrects to 35  % for alv volume  p am symb/spiriva     Recurrent admits are multifactorial ? Suggesting difficult to control copd - DDX of  difficult airways management almost all start with A and  include Adherence, Ace Inhibitors, Acid Reflux, Active Sinus Disease, Alpha 1 Antitripsin deficiency, Anxiety masquerading as Airways dz,  ABPA,  Allergy(esp in young), Aspiration (esp in elderly), Adverse effects of meds,  Active smokers, A bunch of PE's (a small clot burden can't cause this syndrome unless there is already severe underlying pulm or vascular dz with poor reserve) plus two Bs  = Bronchiectasis and Beta blocker use..and one C= CHF    Adherence is always the initial "prime suspect" and is a multilayered concern that requires a "trust but verify" approach in every patient - starting with knowing how to use medications, especially inhalers, correctly, keeping up with refills and understanding the fundamental difference between maintenance and prns vs those medications only taken for a very short course and then stopped and not refilled.  - really struggling with instructions now  -- The proper method of use, as well as anticipated side effects, of a metered-dose inhaler are discussed and demonstrated to the patient. Improved effectiveness after extensive coaching during this visit to a level of approximately 75 % from a baseline of 50 % > ok to continue the hfa but low threshhold to change to laba/ics neb/ needs to understand plan b and C > see avs  ? Acid (or non-acid) GERD > always difficult to exclude as up to 75% of pts in some series report no assoc GI/ Heartburn symptoms>  rec continue max (24h)  acid suppression and diet restrictions/ reviewed     ? Allergy/asthma component > The goal with a chronic steroid dependent illness is always arriving at the lowest effective dose that controls the disease/symptoms and not accepting a set "formula" which is based on statistics or guidelines that don't always take into account patient  variability or the natural hx of the dz in every individual patient, which may well vary over time.  For now therefore I recommend the patient maintain  A floor of prednisone of 10 mg/ day for now  ? BB > probably ok on low dose lopressor  ? chf > no edema now, see dry wt maint  I had an extended discussion with the patient and wife reviewing all relevant studies completed to date and  lasting 25 minutes of a 40 minute transition of care  visit    Each maintenance medication was reviewed in detail including most importantly the difference between maintenance and prns and under what circumstances the prns are to be triggered using an action plan format that is not reflected in the computer generated alphabetically organized AVS.    Please see instructions for details which were reviewed in writing and the patient given a copy highlighting the part that I personally wrote and discussed at today's ov.

## 2015-08-28 NOTE — Assessment & Plan Note (Signed)
02 2.5 lpm 24/7 as of d/c 08/14/15   Was supposed to be on 2.5 but can't tol the dry 02 > humidify and see if eligible for POC  Also would benefit from higher HCO3 (last value 19 really increases VE demand with limited supply)

## 2015-08-29 ENCOUNTER — Telehealth: Payer: Self-pay | Admitting: Cardiovascular Disease

## 2015-08-29 DIAGNOSIS — I25709 Atherosclerosis of coronary artery bypass graft(s), unspecified, with unspecified angina pectoris: Secondary | ICD-10-CM | POA: Diagnosis not present

## 2015-08-29 DIAGNOSIS — N186 End stage renal disease: Secondary | ICD-10-CM | POA: Diagnosis not present

## 2015-08-29 DIAGNOSIS — Z992 Dependence on renal dialysis: Secondary | ICD-10-CM | POA: Diagnosis not present

## 2015-08-29 DIAGNOSIS — J440 Chronic obstructive pulmonary disease with acute lower respiratory infection: Secondary | ICD-10-CM | POA: Diagnosis not present

## 2015-08-29 DIAGNOSIS — I5032 Chronic diastolic (congestive) heart failure: Secondary | ICD-10-CM | POA: Diagnosis not present

## 2015-08-29 DIAGNOSIS — J189 Pneumonia, unspecified organism: Secondary | ICD-10-CM | POA: Diagnosis not present

## 2015-08-29 DIAGNOSIS — I482 Chronic atrial fibrillation: Secondary | ICD-10-CM | POA: Diagnosis not present

## 2015-08-29 DIAGNOSIS — J9621 Acute and chronic respiratory failure with hypoxia: Secondary | ICD-10-CM | POA: Diagnosis not present

## 2015-08-29 DIAGNOSIS — D638 Anemia in other chronic diseases classified elsewhere: Secondary | ICD-10-CM | POA: Diagnosis not present

## 2015-08-29 LAB — HEPATITIS B SURFACE ANTIGEN

## 2015-08-29 NOTE — Telephone Encounter (Signed)
I called the pt and informed him of the message below and scheduled an appt for 3/14.

## 2015-08-29 NOTE — Telephone Encounter (Signed)
Okay. Please let patient know. Please have him schedule a  30 minute follow-up visit with [place] if he plans to continue with [place] as his primary doctor as we have not seen him in some time. Let him know about the lab test and that they did not have sufficient sample. We can repeat at his visit if he wishes.

## 2015-08-29 NOTE — Telephone Encounter (Signed)
New message      Pt c/o BP issue: STAT if pt c/o blurred vision, one-sided weakness or slurred speech  1. What are your last 5 BP readings? Yesterday at dialysis top number was 75; this am 107/52, 2. Are you having any other symptoms (ex. Dizziness, headache, blurred vision, passed out)? no 3. What is your BP issue?  Pt has not taken any bp meds today.  Talk to the nurse about low bp problems

## 2015-08-29 NOTE — Telephone Encounter (Signed)
Spoke with Terry [name] and his wife. Since starting dialysis Terry [name]'s blood pressure has been running low. Systolic was 75 at dialysis yesterday. Later in day it was 119/60. He does not take BP meds on day of dialysis.  This AM blood pressure was 107/52 and heart rate 105.  He did not take amlodipine or lopressor.  Home health nurse saw Terry [name] later today and reading was about the same when she checked.  Wife reports Terry [name] was recently hospitalized and BP has been low since hospitalization. Weight down 60 lbs since starting dialysis.  They are asking if medications need to be adjusted.  I told them for now Terry [name] should take lopressor if BP over 100.  If BP running low could hold amlodipine.  Terry [name] is seeing Dr. Maudie Mercury on 3/14.  I told him adjustments to medications could be made at this appt.   Will also send to Dr. [name] for recommendations.   Terry [name] is due for follow up with Dr. [name].  Needs Tues/Thursday appt due to dialysis schedule.  Terry [name] refuses to see APP.  Appt made for him to see Dr. [name] on 09/26/15 at 9:00

## 2015-08-30 DIAGNOSIS — N186 End stage renal disease: Secondary | ICD-10-CM | POA: Diagnosis not present

## 2015-08-30 DIAGNOSIS — J156 Pneumonia due to other aerobic Gram-negative bacteria: Secondary | ICD-10-CM | POA: Diagnosis not present

## 2015-08-30 DIAGNOSIS — J150 Pneumonia due to Klebsiella pneumoniae: Secondary | ICD-10-CM | POA: Diagnosis not present

## 2015-08-30 NOTE — Telephone Encounter (Signed)
Agree. cdm 

## 2015-09-02 DIAGNOSIS — J449 Chronic obstructive pulmonary disease, unspecified: Secondary | ICD-10-CM | POA: Diagnosis not present

## 2015-09-02 DIAGNOSIS — J150 Pneumonia due to Klebsiella pneumoniae: Secondary | ICD-10-CM | POA: Diagnosis not present

## 2015-09-02 DIAGNOSIS — J156 Pneumonia due to other aerobic Gram-negative bacteria: Secondary | ICD-10-CM | POA: Diagnosis not present

## 2015-09-02 DIAGNOSIS — N186 End stage renal disease: Secondary | ICD-10-CM | POA: Diagnosis not present

## 2015-09-03 ENCOUNTER — Encounter: Payer: Self-pay | Admitting: Family Medicine

## 2015-09-03 ENCOUNTER — Ambulatory Visit (INDEPENDENT_AMBULATORY_CARE_PROVIDER_SITE_OTHER): Payer: Medicare PPO | Admitting: Family Medicine

## 2015-09-03 ENCOUNTER — Telehealth: Payer: Self-pay | Admitting: Internal Medicine

## 2015-09-03 VITALS — BP 102/50 | HR 96 | Temp 97.8°F | Ht 67.0 in | Wt 166.0 lb

## 2015-09-03 DIAGNOSIS — J439 Emphysema, unspecified: Secondary | ICD-10-CM

## 2015-09-03 DIAGNOSIS — T148XXA Other injury of unspecified body region, initial encounter: Secondary | ICD-10-CM

## 2015-09-03 DIAGNOSIS — N186 End stage renal disease: Secondary | ICD-10-CM | POA: Diagnosis not present

## 2015-09-03 DIAGNOSIS — R238 Other skin changes: Secondary | ICD-10-CM | POA: Diagnosis not present

## 2015-09-03 DIAGNOSIS — I5032 Chronic diastolic (congestive) heart failure: Secondary | ICD-10-CM

## 2015-09-03 DIAGNOSIS — J9611 Chronic respiratory failure with hypoxia: Secondary | ICD-10-CM

## 2015-09-03 DIAGNOSIS — J9601 Acute respiratory failure with hypoxia: Secondary | ICD-10-CM | POA: Diagnosis not present

## 2015-09-03 DIAGNOSIS — J449 Chronic obstructive pulmonary disease, unspecified: Secondary | ICD-10-CM

## 2015-09-03 DIAGNOSIS — Z992 Dependence on renal dialysis: Secondary | ICD-10-CM

## 2015-09-03 MED ORDER — MUPIROCIN 2 % EX OINT: 1.0000 "application " | TOPICAL_OINTMENT | Freq: Two times a day (BID) | CUTANEOUS | Status: DC

## 2015-09-03 MED ORDER — BUDESONIDE-FORMOTEROL FUMARATE 160-4.5 MCG/ACT IN AERO: 2.0000 | INHALATION_SPRAY | Freq: Two times a day (BID) | RESPIRATORY_TRACT | Status: DC

## 2015-09-03 NOTE — Progress Notes (Signed)
Pre visit review using our clinic review tool, if applicable. No additional management support is needed unless otherwise documented below in the visit note. 

## 2015-09-03 NOTE — Progress Notes (Signed)
HPI:  Terry [name] is a pleasant 81 year old with a very complicated past medical history whom I have not seen recently. He has a history of coronary artery disease, atrial fibrillation, hypertension and hyperlipidemia followed by his cardiologist (Dr. [name]), end-stage renal disease on dialysis managed by his nephrologist, COPD followed by his pulmonologist (Dr. Melvyn Novas) and acid reflux. He has been in and out of the hospital several times over the last few months, most recently from 2/22 -08/19/15 for hypoxic respiratory failure. V/Q scan done to r/o PE. He was treated for a COPD exacerbation and pulmonary edema. From review of hospital notes, his sputum culture was positive, however ID did not feel that he had a bacterial pneumonia and antibiotics were held. He had follow-up with his pulmonologist several days ago. He is on home O2. His O2 sats at his pulmonology appointment were 88%. He sees his cardiologist next week. His blood pressures have been running a little low after dialysis and his cardiologist advised that he take his metoprolol only if systolic blood pressure over 100 and that he should hold his Norvasc if his blood pressure is running low. Patient and wife report he is doing amazingly well despite his recent health situation. Wife reports his cough is improved, his breathing is improved and that his strength is returning. He now is able to ambulate. He is using oxygen at all times. He is getting dialysis Monday Wednesday and Friday. They report his blood pressure has improved off of Norvasc.  They do have a concern regarding a wound on his right arm. He reports that this was a wart that he has been treating with salicylic acid. Reports became pustulant at one point. Now is a nonhealing lesion on the right arm.Denies chest pain, worsening shortness of breath, fevers, malaise, diarrhea.  ROS: See pertinent positives and negatives per HPI.  Past Medical History  Diagnosis Date  . Stroke ([place])  2012  . High cholesterol   . Hypertension   . Kidney disease     CKD4, sees [place] [facility], considering dialysis  . COPD (chronic obstructive pulmonary disease) ([place])   . CAD (coronary artery disease) [place], [name]  . Acute and chronic respiratory failure 10/22/2012  . Osteoarthritis 10/22/2012  . Pneumonia April 2014  . Atrial fibrillation ([place])   . Shortness of breath dyspnea     Past Surgical History  Procedure Laterality Date  . Coronary stent placement  1999    2 stents  . Appendectomy  1984  . Colon surgery  2012    partial colon removed - twisted bowel  . Hernia repair      Right inguinal  . Cystoscopy/retrograde/ureteroscopy Bilateral 04/23/2014    Procedure: CYSTOSCOPY BILATERAL RETROGRADE,  LEFT URETEROSCOPY, LEFT STENT PLACEMENT;  Surgeon: Raynelle Bring, MD;  Location: [facility];  Service: Urology;  Laterality: Bilateral;  . Av fistula placement Right 05/21/2015    Procedure: Right Arm Brachiocephalic ARTERIOVENOUS (AV) FISTULA CREATION;  Surgeon: Angelia Mould, MD;  [name];  Service: Vascular;  Laterality: Right;    Family History  Problem Relation Age of Onset  . Cancer Mother     Breast  . Heart disease Father   . Heart attack Father   . Parkinson's disease Brother     Social History   Social History  . Marital Status: Married    Spouse Name: N/A  . Number of Children: 3  . Years of Education: N/A   Occupational History  .  retired-worked in radio    Social History Main Topics  . Smoking status: Former Smoker -- 1.00 packs/day for 49 years    Types: Cigarettes, Pipe, Cigars    Quit date: 02/21/2000  . Smokeless tobacco: Never Used  . Alcohol Use: No  . Drug Use: No  . Sexual Activity: Not Asked   Other Topics Concern  . None   Social History Narrative   Work or School: retired Index Situation: lives with wife in [place] regular; poor diet               Current outpatient prescriptions:  .  albuterol (VENTOLIN HFA) 108 (90 Base) MCG/ACT inhaler, Inhale 2 puffs into the lungs every 4 (four) hours as needed for wheezing or shortness of breath., Disp: 1 Inhaler, Rfl: 11 .  amLODipine (NORVASC) 10 MG tablet, Take 1 tablet (10 mg total) by mouth daily., Disp: 90 tablet, Rfl: 3 .  atorvastatin (LIPITOR) 20 MG tablet, Take 1 tablet (20 mg total) by mouth daily., Disp: 30 tablet, Rfl: 6 .  benzonatate (TESSALON) 200 MG capsule, Take 1 capsule (200 mg total) by mouth 3 (three) times daily as needed for cough., Disp: 60 capsule, Rfl: 0 .  budesonide-formoterol (SYMBICORT) 160-4.5 MCG/ACT inhaler, Inhale 2 puffs into the lungs 2 (two) times daily., Disp: 3 Inhaler, Rfl: 3 .  cholecalciferol (VITAMIN D) 1000 units tablet, Take 1,000 Units by mouth daily., Disp: , Rfl:  .  guaiFENesin (MUCINEX) 600 MG 12 hr tablet, Take 2 tablets (1,200 mg total) by mouth 2 (two) times daily., Disp: 14 tablet, Rfl: 0 .  ipratropium-albuterol (DUONEB) 0.5-2.5 (3) MG/3ML SOLN, Take 3 mLs by nebulization every 6 (six) hours as needed., Disp: 360 mL, Rfl: 3 .  methocarbamol (ROBAXIN) 500 MG tablet, Take 1 tablet (500 mg total) by mouth every 8 (eight) hours as needed for muscle spasms., Disp: 30 tablet, Rfl: 0 .  metoprolol tartrate (LOPRESSOR) 25 MG tablet, Take 2 tablets (50 mg total) by mouth 2 (two) times daily. 8am, 5pm, Disp: 60 tablet, Rfl: 3 .  Nutritional Supplements (NEPRO) LIQD, Take 1 Can by mouth 2 (two) times daily before lunch and supper., Disp: , Rfl:  .  polyethylene glycol (MIRALAX) packet, Take 17 g by mouth daily as needed for moderate constipation., Disp: 30 each, Rfl: 0 .  predniSONE (DELTASONE) 10 MG tablet, Take 1 tablet (10 mg total) by mouth daily with breakfast., Disp: 30 tablet, Rfl: 2 .  senna-docusate (SENOKOT S) 8.6-50 MG tablet, Take 2 tablets by mouth at bedtime as needed for mild constipation., Disp: 60 tablet, Rfl: 3 .  tiotropium  (SPIRIVA HANDIHALER) 18 MCG inhalation capsule, INHALE THE CONTENTS OF ONE CAPSULE INTO LUNGS ONCE DAILY (Patient taking differently: Place 18 mcg into inhaler and inhale daily. ), Disp: 30 capsule, Rfl: 3 .  mupirocin ointment (BACTROBAN) 2 %, Place 1 application into the nose 2 (two) times daily., Disp: 22 g, Rfl: 0  EXAM:  Filed Vitals:   09/03/15 1359  BP: 102/50  Pulse: 96  Temp: 97.8 F (36.6 C)    Body mass index is 25.99 kg/(m^2).  GENERAL: vitals reviewed and listed above, alert, oriented, appears well hydrated and in no acute distress  HEENT: atraumatic, conjunttiva clear, no obvious abnormalities on inspection of external nose and ears  NECK: no obvious masses on inspection  LUNGS: clear to  auscultation bilaterally, no wheezes, rales or rhonchi, good air movement  CV: HRRR, no peripheral edema  SKIN: 22 cm shallow wound on the right forearm with some scabbing. No significant surrounding erythema or induration.  MS: moves all extremities without noticeable abnormality  PSYCH: pleasant and cooperative, no obvious depression or anxiety  ASSESSMENT AND PLAN:  Discussed the following assessment and plan:  Acute respiratory failure with hypoxia (HCC)  Chronic respiratory failure with hypoxia (HCC)  Wound of skin  ESRD (end stage renal disease) on dialysis ([place])  Pulmonary emphysema, unspecified emphysema type (HCC)  Chronic diastolic heart failure (HCC)  -Doing well despite extenuating circumstances most of his health conditions or management specialist at this point- -Abx ointment to wound twice daily, dermatology evaluation if this persists per spouse's request. -lengthy visit and discussed precariousness and seriousness of his numerous and severe health conditions and recurrent hospitalizations -initiated difficult discussion regarding prognosis and advised he discuss these issues with his specialists as well and consider evaluation with palliative care -  wife currently would like to pursue all measures to prolong life  -supported and counseled pt and wife -follow up in 3-4 months -Patient advised to return or notify a doctor immediately if symptoms worsen or persist or new concerns arise.  Patient Instructions  Before you leave: -Schedule follow-up in 3-4 months  Please apply the antibiotic ointment, mupirocin, twice daily and follow-up with the dermatologist if this reason persists.  Please follow up with cardiologist as scheduled. Continue treatment with pulmonologist and her kidney specialist.      [name]

## 2015-09-03 NOTE — Patient Instructions (Signed)
Before you leave: -Schedule follow-up in 3-4 months  Please apply the antibiotic ointment, mupirocin, twice daily and follow-up with the dermatologist if this reason persists.  Please follow up with cardiologist as scheduled. Continue treatment with pulmonologist and her kidney specialist.

## 2015-09-03 NOTE — Telephone Encounter (Signed)
Spoke with pt's wife. They are needing an order sent to [facility] for to be evaluated for POC. This order was placed and sent to [facility], they do not use this DME company (they never have). Order has been placed again. A prescription for Symbicort also needs to be sent to the pt's mail order pharmacy. This has been sent in. Nothing further was needed.

## 2015-09-04 DIAGNOSIS — J156 Pneumonia due to other aerobic Gram-negative bacteria: Secondary | ICD-10-CM | POA: Diagnosis not present

## 2015-09-04 DIAGNOSIS — N186 End stage renal disease: Secondary | ICD-10-CM | POA: Diagnosis not present

## 2015-09-04 DIAGNOSIS — J150 Pneumonia due to Klebsiella pneumoniae: Secondary | ICD-10-CM | POA: Diagnosis not present

## 2015-09-06 DIAGNOSIS — J150 Pneumonia due to Klebsiella pneumoniae: Secondary | ICD-10-CM | POA: Diagnosis not present

## 2015-09-06 DIAGNOSIS — J156 Pneumonia due to other aerobic Gram-negative bacteria: Secondary | ICD-10-CM | POA: Diagnosis not present

## 2015-09-06 DIAGNOSIS — N186 End stage renal disease: Secondary | ICD-10-CM | POA: Diagnosis not present

## 2015-09-09 DIAGNOSIS — J150 Pneumonia due to Klebsiella pneumoniae: Secondary | ICD-10-CM | POA: Diagnosis not present

## 2015-09-09 DIAGNOSIS — N186 End stage renal disease: Secondary | ICD-10-CM | POA: Diagnosis not present

## 2015-09-09 DIAGNOSIS — J156 Pneumonia due to other aerobic Gram-negative bacteria: Secondary | ICD-10-CM | POA: Diagnosis not present

## 2015-09-11 DIAGNOSIS — J150 Pneumonia due to Klebsiella pneumoniae: Secondary | ICD-10-CM | POA: Diagnosis not present

## 2015-09-11 DIAGNOSIS — J156 Pneumonia due to other aerobic Gram-negative bacteria: Secondary | ICD-10-CM | POA: Diagnosis not present

## 2015-09-11 DIAGNOSIS — N186 End stage renal disease: Secondary | ICD-10-CM | POA: Diagnosis not present

## 2015-09-13 DIAGNOSIS — J156 Pneumonia due to other aerobic Gram-negative bacteria: Secondary | ICD-10-CM | POA: Diagnosis not present

## 2015-09-13 DIAGNOSIS — N186 End stage renal disease: Secondary | ICD-10-CM | POA: Diagnosis not present

## 2015-09-13 DIAGNOSIS — J150 Pneumonia due to Klebsiella pneumoniae: Secondary | ICD-10-CM | POA: Diagnosis not present

## 2015-09-15 DIAGNOSIS — I5031 Acute diastolic (congestive) heart failure: Secondary | ICD-10-CM | POA: Diagnosis not present

## 2015-09-15 DIAGNOSIS — L0390 Cellulitis, unspecified: Secondary | ICD-10-CM | POA: Diagnosis not present

## 2015-09-15 DIAGNOSIS — K922 Gastrointestinal hemorrhage, unspecified: Secondary | ICD-10-CM | POA: Diagnosis not present

## 2015-09-15 DIAGNOSIS — J449 Chronic obstructive pulmonary disease, unspecified: Secondary | ICD-10-CM | POA: Diagnosis not present

## 2015-09-15 DIAGNOSIS — I2510 Atherosclerotic heart disease of native coronary artery without angina pectoris: Secondary | ICD-10-CM | POA: Diagnosis not present

## 2015-09-15 DIAGNOSIS — Z992 Dependence on renal dialysis: Secondary | ICD-10-CM | POA: Diagnosis not present

## 2015-09-15 DIAGNOSIS — D500 Iron deficiency anemia secondary to blood loss (chronic): Secondary | ICD-10-CM | POA: Diagnosis not present

## 2015-09-15 DIAGNOSIS — M6281 Muscle weakness (generalized): Secondary | ICD-10-CM | POA: Diagnosis not present

## 2015-09-15 DIAGNOSIS — R2681 Unsteadiness on feet: Secondary | ICD-10-CM | POA: Diagnosis not present

## 2015-09-16 DIAGNOSIS — N186 End stage renal disease: Secondary | ICD-10-CM | POA: Diagnosis not present

## 2015-09-16 DIAGNOSIS — J156 Pneumonia due to other aerobic Gram-negative bacteria: Secondary | ICD-10-CM | POA: Diagnosis not present

## 2015-09-16 DIAGNOSIS — J150 Pneumonia due to Klebsiella pneumoniae: Secondary | ICD-10-CM | POA: Diagnosis not present

## 2015-09-17 DIAGNOSIS — J189 Pneumonia, unspecified organism: Secondary | ICD-10-CM | POA: Diagnosis not present

## 2015-09-17 DIAGNOSIS — I5032 Chronic diastolic (congestive) heart failure: Secondary | ICD-10-CM | POA: Diagnosis not present

## 2015-09-17 DIAGNOSIS — J9621 Acute and chronic respiratory failure with hypoxia: Secondary | ICD-10-CM | POA: Diagnosis not present

## 2015-09-17 DIAGNOSIS — I25709 Atherosclerosis of coronary artery bypass graft(s), unspecified, with unspecified angina pectoris: Secondary | ICD-10-CM | POA: Diagnosis not present

## 2015-09-17 DIAGNOSIS — I482 Chronic atrial fibrillation: Secondary | ICD-10-CM | POA: Diagnosis not present

## 2015-09-17 DIAGNOSIS — N186 End stage renal disease: Secondary | ICD-10-CM | POA: Diagnosis not present

## 2015-09-17 DIAGNOSIS — J440 Chronic obstructive pulmonary disease with acute lower respiratory infection: Secondary | ICD-10-CM | POA: Diagnosis not present

## 2015-09-17 DIAGNOSIS — D638 Anemia in other chronic diseases classified elsewhere: Secondary | ICD-10-CM | POA: Diagnosis not present

## 2015-09-17 DIAGNOSIS — Z992 Dependence on renal dialysis: Secondary | ICD-10-CM | POA: Diagnosis not present

## 2015-09-18 DIAGNOSIS — N186 End stage renal disease: Secondary | ICD-10-CM | POA: Diagnosis not present

## 2015-09-18 DIAGNOSIS — J150 Pneumonia due to Klebsiella pneumoniae: Secondary | ICD-10-CM | POA: Diagnosis not present

## 2015-09-18 DIAGNOSIS — J156 Pneumonia due to other aerobic Gram-negative bacteria: Secondary | ICD-10-CM | POA: Diagnosis not present

## 2015-09-19 ENCOUNTER — Other Ambulatory Visit: Payer: Self-pay | Admitting: Internal Medicine

## 2015-09-19 MED ORDER — BUDESONIDE-FORMOTEROL FUMARATE 160-4.5 MCG/ACT IN AERO: 2.0000 | INHALATION_SPRAY | Freq: Two times a day (BID) | RESPIRATORY_TRACT | Status: DC

## 2015-09-20 DIAGNOSIS — N186 End stage renal disease: Secondary | ICD-10-CM | POA: Diagnosis not present

## 2015-09-20 DIAGNOSIS — E1122 Type 2 diabetes mellitus with diabetic chronic kidney disease: Secondary | ICD-10-CM | POA: Diagnosis not present

## 2015-09-20 DIAGNOSIS — J150 Pneumonia due to Klebsiella pneumoniae: Secondary | ICD-10-CM | POA: Diagnosis not present

## 2015-09-20 DIAGNOSIS — J156 Pneumonia due to other aerobic Gram-negative bacteria: Secondary | ICD-10-CM | POA: Diagnosis not present

## 2015-09-20 DIAGNOSIS — Z992 Dependence on renal dialysis: Secondary | ICD-10-CM | POA: Diagnosis not present

## 2015-09-23 ENCOUNTER — Telehealth: Payer: Self-pay | Admitting: Internal Medicine

## 2015-09-23 DIAGNOSIS — J150 Pneumonia due to Klebsiella pneumoniae: Secondary | ICD-10-CM | POA: Diagnosis not present

## 2015-09-23 DIAGNOSIS — N186 End stage renal disease: Secondary | ICD-10-CM | POA: Diagnosis not present

## 2015-09-23 DIAGNOSIS — J156 Pneumonia due to other aerobic Gram-negative bacteria: Secondary | ICD-10-CM | POA: Diagnosis not present

## 2015-09-23 MED ORDER — TIOTROPIUM BROMIDE MONOHYDRATE 18 MCG IN CAPS: ORAL_CAPSULE | RESPIRATORY_TRACT | Status: DC

## 2015-09-23 NOTE — Telephone Encounter (Signed)
Spoke with pt. He needs a refill on Spiriva. This has been sent in to [facility]. Nothing further was needed.

## 2015-09-24 ENCOUNTER — Ambulatory Visit: Payer: Medicare PPO | Admitting: Internal Medicine

## 2015-09-24 DIAGNOSIS — L700 Acne vulgaris: Secondary | ICD-10-CM | POA: Diagnosis not present

## 2015-09-24 DIAGNOSIS — L820 Inflamed seborrheic keratosis: Secondary | ICD-10-CM | POA: Diagnosis not present

## 2015-09-24 DIAGNOSIS — L218 Other seborrheic dermatitis: Secondary | ICD-10-CM | POA: Diagnosis not present

## 2015-09-24 DIAGNOSIS — L821 Other seborrheic keratosis: Secondary | ICD-10-CM | POA: Diagnosis not present

## 2015-09-25 ENCOUNTER — Other Ambulatory Visit: Payer: Self-pay | Admitting: Family Medicine

## 2015-09-25 DIAGNOSIS — J156 Pneumonia due to other aerobic Gram-negative bacteria: Secondary | ICD-10-CM | POA: Diagnosis not present

## 2015-09-25 DIAGNOSIS — J150 Pneumonia due to Klebsiella pneumoniae: Secondary | ICD-10-CM | POA: Diagnosis not present

## 2015-09-25 DIAGNOSIS — N186 End stage renal disease: Secondary | ICD-10-CM | POA: Diagnosis not present

## 2015-09-25 NOTE — Progress Notes (Signed)
Chief Complaint  Patient presents with  . Follow-up     History of Present Illness: 81 yo male with history of HTN, HLD, CVA, COPD, CAD, ESRD on HD, here today for cardiac follow up. He had been followed by cardiology in [place], [name] for many years until he moved to [place] in 2014. Cath records received from [facility] . Cardiac cath 11/30/1997. Mild distal Left main stenosis, 25% proximal LAD, minor irregularities Circumflex, 95% mid RCA stenosis treated with 3.5 x 32 mm bare metal stent. There was a ? Overlapping 3.5 x 18 mm bare metal stent more proximally. Echo 02/03/13 with LVEF=50-55%. He had normal carotid dopplers in [place] 2013. He was admitted to [facility] October 2015 with ruptured kidney cyst. Atrial fibrillation diagnosed in December 2015 at an office visit. He was started on Eliquis at that time. He has been followed in Nephrology for his kidney disease and is now ESRD on HD. Low risk stress myoview September 2016 with possible ischemia. He had no chest pain so cath not planned at that time. His Eliquis was stopped due to HD and he was tried on coumadin for his atrial fibrillation but had GI blood loss so coumadin was stopped. He has not been on coumadin or ASA.   He is here today for follow up. No chest pain. Breathing better since HD started.   Primary Care Physician: Dr. Colin Benton  Past Medical History  Diagnosis Date  . Stroke ([place]) 2012  . High cholesterol   . Hypertension   . Kidney disease     CKD4, sees [place] [facility], considering dialysis  . COPD (chronic obstructive pulmonary disease) ([place])   . CAD (coronary artery disease) [place], [name]  . Acute and chronic respiratory failure 10/22/2012  . Osteoarthritis 10/22/2012  . Pneumonia April 2014  . Atrial fibrillation ([place])   . Shortness of breath dyspnea     Past Surgical History  Procedure Laterality Date  . Coronary stent placement  1999    2 stents  . Appendectomy  1984  . Colon surgery   2012    partial colon removed - twisted bowel  . Hernia repair      Right inguinal  . Cystoscopy/retrograde/ureteroscopy Bilateral 04/23/2014    Procedure: CYSTOSCOPY BILATERAL RETROGRADE,  LEFT URETEROSCOPY, LEFT STENT PLACEMENT;  Surgeon: Raynelle Bring, MD;  Location: [facility];  Service: Urology;  Laterality: Bilateral;  . Av fistula placement Right 05/21/2015    Procedure: Right Arm Brachiocephalic ARTERIOVENOUS (AV) FISTULA CREATION;  Surgeon: Angelia Mould, MD;  Location: [facility] OR;  Service: Vascular;  Laterality: Right;    Current Outpatient Prescriptions  Medication Sig Dispense Refill  . albuterol (VENTOLIN HFA) 108 (90 Base) MCG/ACT inhaler Inhale 2 puffs into the lungs every 4 (four) hours as needed for wheezing or shortness of breath. 1 Inhaler 11  . atorvastatin (LIPITOR) 20 MG tablet Take 1 tablet (20 mg total) by mouth daily. 30 tablet 6  . budesonide-formoterol (SYMBICORT) 160-4.5 MCG/ACT inhaler Inhale 2 puffs into the lungs 2 (two) times daily. 3 Inhaler 3  . cholecalciferol (VITAMIN D) 1000 units tablet Take 1,000 Units by mouth daily.    [name] guaiFENesin (MUCINEX) 600 MG 12 hr tablet Take 2 tablets (1,200 mg total) by mouth 2 (two) times daily. 14 tablet 0  . ipratropium-albuterol (DUONEB) 0.5-2.5 (3) MG/3ML SOLN Take 3 mLs by nebulization every 6 (six) hours as needed. 360 mL 3  . methocarbamol (ROBAXIN) 500 MG tablet  Take 1 tablet (500 mg total) by mouth every 8 (eight) hours as needed for muscle spasms. 30 tablet 0  . metoprolol tartrate (LOPRESSOR) 50 MG tablet Take 1 tablet (50 mg total) by mouth 2 (two) times daily. 8am, 5pm 60 tablet 11  . mupirocin ointment (BACTROBAN) 2 % Place 1 application into the nose 2 (two) times daily. 22 g 0  . Nutritional Supplements (NEPRO) LIQD Take 1 Can by mouth 2 (two) times daily before lunch and supper.    . polyethylene glycol (MIRALAX) packet Take 17 g by mouth daily as needed for moderate constipation. 30 each 0  . predniSONE  (DELTASONE) 10 MG tablet Take 1 tablet (10 mg total) by mouth daily with breakfast. 30 tablet 2  . senna-docusate (SENOKOT S) 8.6-50 MG tablet Take 2 tablets by mouth at bedtime as needed for mild constipation. 60 tablet 3  . tiotropium (SPIRIVA HANDIHALER) 18 MCG inhalation capsule INHALE THE CONTENTS OF ONE CAPSULE INTO LUNGS ONCE DAILY 90 capsule 3  . aspirin EC 325 MG tablet Take 1 tablet (325 mg total) by mouth daily. 30 tablet 0   No current facility-administered medications for this visit.    Allergies  Allergen Reactions  . Yellow Dyes (Non-Tartrazine) Other (See Comments)    Burns arms Cough medications (tussionex and benzonatate ok)  . Levaquin [Levofloxacin In D5w] Itching  . Tussionex Pennkinetic Er [Hydrocod Polst-Cpm Polst Er] Other (See Comments)    Unknown on MAR    Social History   Social History  . Marital Status: Married    Spouse Name: N/A  . Number of Children: 3  . Years of Education: N/A   Occupational History  . retired-worked in radio    Social History Main Topics  . Smoking status: Former Smoker -- 1.00 packs/day for 49 years    Types: Cigarettes, Pipe, Cigars    Quit date: 02/21/2000  . Smokeless tobacco: Never Used  . Alcohol Use: No  . Drug Use: No  . Sexual Activity: Not on file   Other Topics Concern  . Not on file   Social History Narrative   Work or School: retired [place] Situation: lives with wife in [place] Beliefs: [place] regular; poor diet             Family History  Problem Relation Age of Onset  . Cancer Mother     Breast  . Heart disease Father   . Heart attack Father   . Parkinson's disease Brother     Review of Systems:  As stated in the HPI and otherwise negative.   BP 102/44 mmHg  Pulse 90  Ht 5\' 4"  (1.626 m)  Wt 158 lb 6.4 oz (71.85 kg)  BMI 27.18 kg/m2  SpO2 95%  Physical Examination: General: Well developed, well nourished, NAD HEENT: OP  clear, mucus membranes moist SKIN: warm, dry. No rashes. Neuro: No focal deficits Musculoskeletal: Muscle strength 5/5 all ext Psychiatric: Mood and affect normal Neck: No JVD, no carotid bruits, no thyromegaly, no lymphadenopathy. Lungs:Clear bilaterally, no wheezes, rhonci, crackles Cardiovascular: Irreg irreg rate and rhythm. No murmurs, gallops or rubs. Abdomen:Soft. Bowel sounds present. Non-tender.  Extremities: Trace bilateral lower extremity edema. Pulses are 2 + in the bilateral DP/PT.  Echo 02/03/13: Left ventricle: The cavity size was normal. Wall thickness was normal. Systolic function was normal. The estimated ejection fraction was in the  range of 50% to 55%. Doppler parameters are consistent with abnormal left ventricular relaxation (grade 1 diastolic dysfunction).  EKG:  EKG is not ordered today. The ekg ordered today demonstrates   Recent Labs: 06/26/2015: Magnesium 1.9 07/28/2015: B Natriuretic Peptide 432.6* 08/15/2015: ALT 14*; TSH 2.383 08/19/2015: BUN 100*; Creatinine, Ser 6.32*; Hemoglobin 11.1*; Platelets 298; Potassium 4.3; Sodium 130*   Lipid Panel    Component Value Date/Time   CHOL 183 03/20/2015 1001   TRIG 91 06/08/2015 0016   HDL 34.40* 03/20/2015 1001   CHOLHDL 5 03/20/2015 1001   VLDL 38.8 03/20/2015 1001   LDLCALC 109* 03/20/2015 1001   LDLDIRECT 93.9 02/03/2013 0933     Wt Readings from Last 3 Encounters:  09/26/15 158 lb 6.4 oz (71.85 kg)  09/03/15 166 lb (75.297 kg)  08/27/15 163 lb 3.2 oz (74.027 kg)     Other studies Reviewed: Additional studies/ records that were reviewed today include: . Review of the above records demonstrates:    Assessment and Plan:   1. CAD: He has had no recent chest pains. Continue beta blocker, statin. Will start ASA 325 mg daily.   2. HTN: BP well controlled. No changes.   3. Hyperlipidemia: Continue statin.   4. Atrial fib, persistent: He is in atrial fib today. He did not tolerate coumadin due to GI  bleeding. He cannot take a NOAC with HD. Will continue beta blocker. CHADS VASC Score of 6 so long term anti-coagulation would be favorable. Will try to find out what type of bleeding he had.   Current medicines are reviewed at length with the patient today.  The patient does not have concerns regarding medicines.  The following changes have been made:  no change  Labs/ tests ordered today include:   No orders of the defined types were placed in this encounter.    Disposition:   FU with me in 3-4 months  Signed, Lauree Chandler, MD 09/26/2015 10:01 AM    [facility] [address], [place], [place]  [address] Phone: [phone]; Fax: [phone]

## 2015-09-26 ENCOUNTER — Ambulatory Visit (INDEPENDENT_AMBULATORY_CARE_PROVIDER_SITE_OTHER): Payer: Medicare PPO | Admitting: Cardiovascular Disease

## 2015-09-26 ENCOUNTER — Encounter: Payer: Self-pay | Admitting: Internal Medicine

## 2015-09-26 ENCOUNTER — Ambulatory Visit (INDEPENDENT_AMBULATORY_CARE_PROVIDER_SITE_OTHER): Payer: Medicare PPO | Admitting: Internal Medicine

## 2015-09-26 ENCOUNTER — Encounter: Payer: Self-pay | Admitting: Cardiovascular Disease

## 2015-09-26 VITALS — BP 102/44 | HR 90 | Ht 64.0 in | Wt 158.4 lb

## 2015-09-26 VITALS — BP 102/58 | HR 100 | Ht 68.0 in | Wt 159.0 lb

## 2015-09-26 DIAGNOSIS — I10 Essential (primary) hypertension: Secondary | ICD-10-CM

## 2015-09-26 DIAGNOSIS — J449 Chronic obstructive pulmonary disease, unspecified: Secondary | ICD-10-CM

## 2015-09-26 DIAGNOSIS — E785 Hyperlipidemia, unspecified: Secondary | ICD-10-CM | POA: Diagnosis not present

## 2015-09-26 DIAGNOSIS — I4819 Other persistent atrial fibrillation: Secondary | ICD-10-CM

## 2015-09-26 DIAGNOSIS — I481 Persistent atrial fibrillation: Secondary | ICD-10-CM | POA: Diagnosis not present

## 2015-09-26 DIAGNOSIS — I2510 Atherosclerotic heart disease of native coronary artery without angina pectoris: Secondary | ICD-10-CM | POA: Diagnosis not present

## 2015-09-26 DIAGNOSIS — J9611 Chronic respiratory failure with hypoxia: Secondary | ICD-10-CM

## 2015-09-26 MED ORDER — BENZONATATE 200 MG PO CAPS: ORAL_CAPSULE | ORAL | Status: DC

## 2015-09-26 MED ORDER — METOPROLOL TARTRATE 50 MG PO TABS: 50.0000 mg | ORAL_TABLET | Freq: Two times a day (BID) | ORAL | Status: DC

## 2015-09-26 MED ORDER — ASPIRIN EC 325 MG PO TBEC: 325.0000 mg | DELAYED_RELEASE_TABLET | Freq: Every day | ORAL | Status: DC

## 2015-09-26 MED ORDER — PREDNISONE 10 MG PO TABS: 10.0000 mg | ORAL_TABLET | Freq: Every day | ORAL | Status: DC

## 2015-09-26 NOTE — Progress Notes (Signed)
Subjective:    Patient ID: Terry [name], male    DOB: [birth year]  MRN: [id number]   Brief patient profile:  71 yowm quit smoking 2001 bothered by doe seemed better then worse in 2010 p "lung infection" and stayed on advair/spiriva  Combo since 2012 seemed better then admitted in [name] with pna 10/09/12  in then transferred to [name] x 3 weeks and discharged  On May 1st  2014 referred to pulmonary clinic 11/29/12  by Terry [name] for copd eval and proved to have GOLD III copd 01/26/13 with symptoms much better off ACEi.   HPI 11/29/2012 1st pulmonary ov/ [name]/ on ACEi/ cc doe x maybe a block of walking but not back to baseline and able to do 5 steps and has proaire but not needing. Also persistent dry daytime cough x months >d/c ACE    Admission date: 04/12/2014 Discharge Date: 04/20/2014    Admission Diagnosis Chronic kidney disease, stage III (moderate) [N18.3] Hyperkalemia [E87.5] Left flank pain [R10.9] Cyst of left kidney [Q61.00] COPD mixed type [J44.9] Essential hypertension [I10] Abdominal pain [R10.9]   Discharge Diagnosis Chronic kidney disease, stage III (moderate) [N18.3] Hyperkalemia [E87.5] Left flank pain [R10.9] Cyst of left kidney [Q61.00] COPD mixed type [J44.9] Essential hypertension [I10] Abdominal pain [R10.9]   Principal Problem:  Left flank pain  COPD GOLD III  Chronic kidney disease, stage III (moderate)  CAD (coronary artery disease) of artery bypass graft  HTN (hypertension)  Hyperkalemia   [name] Course  1. Left flank pain - due to hemorrhage and a renal cyst repeat CT confirms, seen by urologist D/W Terry [facility] [name] on 04-14-14, likely cystoscopy outpatient, will continue to follow closely his symptoms and renal function. CT showed some perinephric stranding on the left side, had ++ leukocytosis resolved on empiric IV Rocephin will switch to 4 more days of PO Ceftin on 04-20-14, follow with PCP, renal and urology. 2. Acute renal  failure on top of chronic disease stage IV. Baseline creatinine around 2. Also had some bladder outlet obstruction. Foley was placed, he was hydrated, ACE inhibitor and Lasix held. Renal function has steadily improved. Appreciate nephrology input. We will follow post discharge with nephrology and PCP. 3. Hyponatremia - Per renal. Improving with self diuresis. Repeat BMP in 3-4 days. 4. Hypertension. Stable on beta blocker and Norvasc continue. ACE and Lasix discontinued due to acute renal failure. 5. CAD. No acute issue. Continue beta blocker, hold aspirin for now.  6.Constipation. Placed on bowel regimen and monitor.  7. Shortness of breath. Likely due to combination of mild fluid overload and questionable COPD exacerbation, much improved. The fluids were held and he is being self diuresing, also after a trial of IV steroids. Still requiring 2 L of nasal cannula oxygen down to 5. Much improved after pulmonary toiletry, he be discharged with nebulizer treatments on a steroid taper and home oxygen. Steadily improving with ongoing self diuresis   05/28/2014 transition of care/ post hosp f/u ov/T[name] re:  Chief Complaint  Patient presents with  . HFU    Breathing has improved some, but not back to his normal  baseline. He gets SOB walking minimal distances.    still on advair/ saba twice daily helps some - no symptoms at rest or sleeping  rec Try symbicort 160  Take 2 puffs first thing in am and then another 2 puffs about 12 hours later.  Only use your albuterol as needed   08/20/2014 NP  Follow up COPD  Patient  returns for a 6 week follow-up. Patient reports that he has good days and bad. Patient was previously on [name] after his last hospitalization and would like to try this inhaler instead of Advair He feels that he had improved control of his breathing on this He denies any chest pain, orthopnea, PND, increased leg swelling or hemoptysis. Chest x-ray last visit with COPD changes. No acute  process noted. rec May try Dulera 200 2 puffs Twice daily  , rinse after use.  Continue on Spiriva daily .    10/22/2014 f/u ov/Terry [name] re: GOLD III on dulera 200 2 bid and spiriva/ no need for saba / not using med calendar  Chief Complaint  Patient presents with  . Follow-up    Pt states that his breathing has improved since the last visit.   doe improves and now able to walk mailbox and back s stopping  = MMRC1  rec Please schedule a follow up visit in 3 months but call sooner if needed with pfts and bring all inhalers with you     02/26/2015 f/u ov/Terry [name] re:  GOLD III/ no 02 on symb/ spiriva   Chief Complaint  Patient presents with  . Follow-up    Pt states that his breathing is unchanged. He is having increased cough x 3 wks- prod with white sputum.   No need for rescue rx Doe x MMRC1 = can walk nl pace, flat grade, can't hurry or go uphills or steps s sob  s 02  rec  No change in medications - your weight and kidney function are clearly an issue and I will let Terry [name] know about the HCO3 issue Let the symbicort and spiriva go out thru your nose  Only use your albuterol (ventolin) as a rescue medication   Please schedule a follow up visit in 3 months but call sooner if needed bring all inhalers with you and fill the spiriva respimat before the visit - late add: ? Bisoprolol instead of lopressor   Admit date: 08/14/2015 Discharge date: 08/19/2015 Discharge Diagnoses:  Active Problems:  CAD (coronary artery disease) of artery bypass graft  COPD exacerbation (HCC)  Acute respiratory failure with hypoxia (HCC)  Chronic atrial fibrillation (HCC)  Chronic diastolic heart failure (HCC)  Anemia of chronic disease  ESRD (end stage renal disease) on dialysis ([place])   08/27/2015  Extended post hosp /transition of care  f/u ov/Terry [name] re: recurrent admits/ now chronic resp failure/HD dep CRI plus GOLD III rx symb/spriiva/neb saba Chief Complaint  Patient presents with  . Follow-up     Pt c/o occasional cough with white mucus. Pt denies wheeze/increased SOB/CP/tightness. Pt is on O2/2L, he states that on 3L it dries out his nose and burns. Pt also c/o low BP readings and wonders if some of his HTN meds need to be changed.   now sleeping in a recliner since admit 30-45 degrees due to sob/some noct cough but excess/ purulent sputum or mucus plugs   On prednisone with floor of   @ 10 mg since d/c 08/19/15  rec May need to consider adding Oracit to your regimen and I will le Terry Lorrene Reid know You will need to adjust your 02 to saturations of over 90%  Best fit and humidified 02 ordered  Plan A = Automatic = Symbicort 160 2 in am and 2 in pm with spiriva each am and prednisone 10 mg with breakfast daily  Work on inhaler technique:  Plan B = Backup Only use  your albuterol (proventil) as a rescue medication Plan C = Crisis - only use your albuterol nebulizer if you first try Plan B and it fails to help > ok to use the nebulizer up to every 4 hours but if start needing it regularly call for immediate appointment   09/26/2015  f/u ov/Terry [name] re: COPD III on symbicort 160/spiriva and 02 2lpm 24/7  Chief Complaint  Patient presents with  . Follow-up    Cough has improved some- requesting refill on tessalon.    doe/ weak x room to room  Prednisone 10 mg daily floor for now  No need for saba or neb  At this point    No obvious day to day or daytime variabilty or assoc  cp or chest tightness, subjective wheeze overt sinus or hb symptoms. No unusual exp hx or h/o childhood pna/ asthma or knowledge of premature birth.   Sleeping ok without nocturnal  or early am exacerbation  of respiratory  c/o's or need for noct saba. Also denies any obvious fluctuation of symptoms with weather or environmental changes or other aggravating or alleviating factors except as outlined above   Current Medications, Allergies, Complete Past Medical History, Past Surgical History, Family History, and Social History  were reviewed in [employer] record.  ROS  The following are not active complaints unless bolded sore throat, dysphagia, dental problems, itching, sneezing,  nasal congestion or excess/ purulent secretions, ear ache,   fever, chills, sweats, unintended wt loss, pleuritic or exertional cp, hemoptysis,  orthopnea pnd or leg swelling, presyncope, palpitations, heartburn, abdominal pain, anorexia, nausea, vomiting, diarrhea  or change in bowel or urinary habits, change in stools or urine, dysuria,hematuria,  rash, arthralgias, visual complaints, headache, numbness weakness or ataxia or problems with walking or coordination,  change in mood/affect or memory.           Objective:   Physical Exam  Somber slt hoarse  W/c bound /02 bound wm nad / able to walk very slowly across exam room but with bent knees and slt unsteady on his feet  02/26/2015          210 >  08/27/2015 163 p HD > 09/26/2015  159     10/22/14 202 lb (91.627 kg)  08/28/14 203 lb 8 oz (92.307 kg)  08/20/14 205 lb 6.4 oz (93.169 kg)    Vital signs reviewed   HEENT: nl dentition, turbinates, and oropharynx. Nl external ear canals without cough reflex   NECK :  without JVD/Nodes/TM/ nl carotid upstrokes bilaterally   LUNGS: no acc muscle use,  Barrel chest, moderate, with increased Texp but min exp rhonchi  CV:  RRR  no s3 or murmur or increase in P2, no edema   ABD:  soft and nontender with nl inspiratory excursion in the supine position. No bruits or organomegaly, bowel sounds nl  MS:  Nl gait/ ext warm without deformities, calf tenderness, cyanosis or clubbing No obvious joint restrictions   SKIN: warm and dry without lesions    NEURO:  alert, approp, nl sensorium with  no motor deficits          I personally reviewed images and agree with radiology impression as follows:  CXR:  08/14/15 Stable exam. Emphysema with cardiomegaly and bibasilar atelectasis.                          Assessment & Plan:

## 2015-09-26 NOTE — Patient Instructions (Addendum)
Prednisone 10 mg one on even and one-half on odd days x 2 weeks and if not change then reduce to 5 mg daily   Work on maintaining perfect inhaler technique:  relax and gently blow all the way out then take a nice smooth deep breath back in, triggering the inhaler at same time you start breathing in.  Hold for up to 5 seconds if you can. Blow out thru nose. Rinse and gargle with water when done  Goal for 02 is 90% or above / ok adjust up  Or down or off as along as attaining this goal  Please schedule a follow up office visit in 6 weeks, call sooner if needed

## 2015-09-26 NOTE — Patient Instructions (Signed)
Medication Instructions:   Your physician has recommended you make the following change in your medication:  Start enteric coated Aspirin 325 mg by mouth daily.   Labwork: none  Testing/Procedures: none  Follow-Up: Your physician recommends that you schedule a follow-up appointment in: 3-4 months.     Any Other Special Instructions Will Be Listed Below (If Applicable).     If you need a refill on your cardiac medications before your next appointment, please call your pharmacy.

## 2015-09-27 DIAGNOSIS — J150 Pneumonia due to Klebsiella pneumoniae: Secondary | ICD-10-CM | POA: Diagnosis not present

## 2015-09-27 DIAGNOSIS — N186 End stage renal disease: Secondary | ICD-10-CM | POA: Diagnosis not present

## 2015-09-27 DIAGNOSIS — J156 Pneumonia due to other aerobic Gram-negative bacteria: Secondary | ICD-10-CM | POA: Diagnosis not present

## 2015-09-27 NOTE — Assessment & Plan Note (Addendum)
-   Trial off ACEi 11/30/2012 >>> much better symptom control - 05/28/2014   Walked RA x one lap @ 185 stopped due to  [employer] / sats ok/ mod pace - PFT's 01/26/13      FEV1  1.28 (48%) ratio 48 and no better p B2,  DLCO 39 corrects to 48%  - PFTs 02/26/2015   FEV1 0.99  (35 % ) ratio 35  p 11 % improvement from saba with DLCO  24 % corrects to 35  % for alv volume  p am symb/spiriva    - Pred maint rx with floor of 10 mg per day at d/c 08/14/15    - 09/26/2015  p extensive coaching HFA effectiveness =    75%   The goal with a chronic steroid dependent illness is always arriving at the lowest effective dose that controls the disease/symptoms and not accepting a set "formula" which is based on statistics or guidelines that don't always take into account patient  variability or the natural hx of the dz in every individual patient, which may well vary over time.  For now therefore I recommend the patient work toward a floor of 5 mg daily prednisone  I had an extended discussion with the patient reviewing all relevant studies completed to date and  lasting 15 to 20 minutes of a 25 minute visit    Each maintenance medication was reviewed in detail including most importantly the difference between maintenance and prns and under what circumstances the prns are to be triggered using an action plan format that is not reflected in the computer generated alphabetically organized AVS.    Please see instructions for details which were reviewed in writing and the patient given a copy highlighting the part that I personally wrote and discussed at today's ov.

## 2015-09-27 NOTE — Assessment & Plan Note (Signed)
02 2.5  lpm 24/7 as of d/c 08/14/15   Changed 09/26/2015 to 2lpm sleeping / ok to titrate during the day for sat >90%

## 2015-09-30 DIAGNOSIS — J150 Pneumonia due to Klebsiella pneumoniae: Secondary | ICD-10-CM | POA: Diagnosis not present

## 2015-09-30 DIAGNOSIS — N186 End stage renal disease: Secondary | ICD-10-CM | POA: Diagnosis not present

## 2015-09-30 DIAGNOSIS — J156 Pneumonia due to other aerobic Gram-negative bacteria: Secondary | ICD-10-CM | POA: Diagnosis not present

## 2015-10-01 ENCOUNTER — Other Ambulatory Visit (HOSPITAL_COMMUNITY): Payer: Self-pay | Admitting: Urology

## 2015-10-01 DIAGNOSIS — I482 Chronic atrial fibrillation: Secondary | ICD-10-CM | POA: Diagnosis not present

## 2015-10-01 DIAGNOSIS — N186 End stage renal disease: Secondary | ICD-10-CM | POA: Diagnosis not present

## 2015-10-01 DIAGNOSIS — N281 Cyst of kidney, acquired: Secondary | ICD-10-CM

## 2015-10-01 DIAGNOSIS — I5032 Chronic diastolic (congestive) heart failure: Secondary | ICD-10-CM | POA: Diagnosis not present

## 2015-10-01 DIAGNOSIS — J189 Pneumonia, unspecified organism: Secondary | ICD-10-CM | POA: Diagnosis not present

## 2015-10-01 DIAGNOSIS — I25709 Atherosclerosis of coronary artery bypass graft(s), unspecified, with unspecified angina pectoris: Secondary | ICD-10-CM | POA: Diagnosis not present

## 2015-10-01 DIAGNOSIS — D638 Anemia in other chronic diseases classified elsewhere: Secondary | ICD-10-CM | POA: Diagnosis not present

## 2015-10-01 DIAGNOSIS — J9621 Acute and chronic respiratory failure with hypoxia: Secondary | ICD-10-CM | POA: Diagnosis not present

## 2015-10-01 DIAGNOSIS — Z992 Dependence on renal dialysis: Secondary | ICD-10-CM | POA: Diagnosis not present

## 2015-10-01 DIAGNOSIS — J440 Chronic obstructive pulmonary disease with acute lower respiratory infection: Secondary | ICD-10-CM | POA: Diagnosis not present

## 2015-10-02 DIAGNOSIS — J156 Pneumonia due to other aerobic Gram-negative bacteria: Secondary | ICD-10-CM | POA: Diagnosis not present

## 2015-10-02 DIAGNOSIS — N186 End stage renal disease: Secondary | ICD-10-CM | POA: Diagnosis not present

## 2015-10-02 DIAGNOSIS — J150 Pneumonia due to Klebsiella pneumoniae: Secondary | ICD-10-CM | POA: Diagnosis not present

## 2015-10-03 DIAGNOSIS — J449 Chronic obstructive pulmonary disease, unspecified: Secondary | ICD-10-CM | POA: Diagnosis not present

## 2015-10-04 DIAGNOSIS — N186 End stage renal disease: Secondary | ICD-10-CM | POA: Diagnosis not present

## 2015-10-04 DIAGNOSIS — J150 Pneumonia due to Klebsiella pneumoniae: Secondary | ICD-10-CM | POA: Diagnosis not present

## 2015-10-04 DIAGNOSIS — J156 Pneumonia due to other aerobic Gram-negative bacteria: Secondary | ICD-10-CM | POA: Diagnosis not present

## 2015-10-07 DIAGNOSIS — N186 End stage renal disease: Secondary | ICD-10-CM | POA: Diagnosis not present

## 2015-10-07 DIAGNOSIS — J150 Pneumonia due to Klebsiella pneumoniae: Secondary | ICD-10-CM | POA: Diagnosis not present

## 2015-10-07 DIAGNOSIS — J156 Pneumonia due to other aerobic Gram-negative bacteria: Secondary | ICD-10-CM | POA: Diagnosis not present

## 2015-10-08 ENCOUNTER — Telehealth: Payer: Self-pay | Admitting: Cardiovascular Disease

## 2015-10-08 NOTE — Telephone Encounter (Signed)
New Message .Pt c/o medication issue: 1. Name of Medication: metoprolol tartrate (LOPRESSOR) 50 MG tablet   4. What is your medication issue? 90/48 on Saturday after taking a full dose, So he has taken a half of dose since then and it seems to be doing ok. Request a call back to discuss.

## 2015-10-08 NOTE — Telephone Encounter (Signed)
Returned call to patient's wife.She stated husband's B/P has been low.Last Friday 10/04/15 B/P 90/48 pulse 102 after dialysis.Stated he has been holding metoprolol on dialysis days Mon-Wed-Fri.Stated on Sat 4/15 he decreased metoprolol to 25 mg twice a day.Today B/P better 104/50 pulse 102.Also she wanted to ask Dr.McAlhany if he decided about husband having a cardioversion.Message sen to [place] for advice.

## 2015-10-09 DIAGNOSIS — J156 Pneumonia due to other aerobic Gram-negative bacteria: Secondary | ICD-10-CM | POA: Diagnosis not present

## 2015-10-09 DIAGNOSIS — N186 End stage renal disease: Secondary | ICD-10-CM | POA: Diagnosis not present

## 2015-10-09 DIAGNOSIS — J150 Pneumonia due to Klebsiella pneumoniae: Secondary | ICD-10-CM | POA: Diagnosis not present

## 2015-10-09 NOTE — Telephone Encounter (Signed)
Would continue metoprolol 25 mg BID. In regards to his GI bleeding and coumadin, he had an active GI bleed while hospitalized January 2017. GI saw him then and recommended that he be seen in f/u by Dr. Ardis Hughs and have EGD before having coumadin restarted. I cannot plan cardioversion until he has been on coumadin for at least a month. He needs to have follow up in GI clinic and then we can discuss restarting coumadin if EGD ok and GI says it is ok to restart the coumadin. If we do get an ok to restart coumadin from GI, he would need one month of therapeutic INR before we could plan cardioversion. Thanks, Terry [name]

## 2015-10-10 MED ORDER — METOPROLOL TARTRATE 25 MG PO TABS: 25.0000 mg | ORAL_TABLET | Freq: Two times a day (BID) | ORAL | Status: DC

## 2015-10-10 NOTE — Telephone Encounter (Signed)
Spoke with pt's wife and gave her information from Dr. [name]. She will contact Dr. Eugenia Pancoast office to schedule appt.  Will send prescription for Lopressor 25 mg twice daily to [facility] in [place].

## 2015-10-11 DIAGNOSIS — Z452 Encounter for adjustment and management of vascular access device: Secondary | ICD-10-CM | POA: Diagnosis not present

## 2015-10-12 DIAGNOSIS — J150 Pneumonia due to Klebsiella pneumoniae: Secondary | ICD-10-CM | POA: Diagnosis not present

## 2015-10-12 DIAGNOSIS — N186 End stage renal disease: Secondary | ICD-10-CM | POA: Diagnosis not present

## 2015-10-12 DIAGNOSIS — J156 Pneumonia due to other aerobic Gram-negative bacteria: Secondary | ICD-10-CM | POA: Diagnosis not present

## 2015-10-14 ENCOUNTER — Telehealth: Payer: Self-pay | Admitting: Cardiovascular Disease

## 2015-10-14 DIAGNOSIS — J156 Pneumonia due to other aerobic Gram-negative bacteria: Secondary | ICD-10-CM | POA: Diagnosis not present

## 2015-10-14 DIAGNOSIS — N186 End stage renal disease: Secondary | ICD-10-CM | POA: Diagnosis not present

## 2015-10-14 DIAGNOSIS — J150 Pneumonia due to Klebsiella pneumoniae: Secondary | ICD-10-CM | POA: Diagnosis not present

## 2015-10-14 MED ORDER — METOPROLOL TARTRATE 25 MG PO TABS: 12.5000 mg | ORAL_TABLET | Freq: Two times a day (BID) | ORAL | Status: DC

## 2015-10-14 NOTE — Telephone Encounter (Signed)
New message   Pt wife is calling to speak to rn/ pt is feeling fine today 105/70 today reading HR 90  Pt c/o medication issue:  1. Name of Medication: motoprolol   2. How are you currently taking this medication (dosage and times per day)? 25mg  2x day  3. Are you having a reaction (difficulty breathing--STAT)? no 4. What is your medication issue?  His BP 78/38     midrodrine to bring it back up

## 2015-10-14 NOTE — Telephone Encounter (Signed)
I spoke with pt's wife and gave her instructions from Dr. [name]. Will send prescription to [facility] in [facility]

## 2015-10-14 NOTE — Telephone Encounter (Signed)
Spoke with pt's wife. She reports pt had dialysis on Saturday. Did not lopressor that day.  On Sunday he took lopressor 25 mg and after this started feeling dizzy.  BP was 78/38.  Pt put feet up and took midodrine. He is given midodrine when BP low at dialysis.  Sunday afternoon BP was 105/60. Did not take evening dose of lopressor last evening.  Today he is feeling OK and did not take lopressor as today is a dialysis day.  He is currently at dialysis.  Wife reports heart rates of 90, 102 and 113.   She is asking if pt should decrease or stop lopressor

## 2015-10-14 NOTE — Telephone Encounter (Signed)
Let's try Lopressor 12.5 mg po BID and if he does not tolerate that dose, will stop it completely. thanks

## 2015-10-15 ENCOUNTER — Ambulatory Visit (HOSPITAL_COMMUNITY)
Admission: RE | Admit: 2015-10-15 | Discharge: 2015-10-15 | Disposition: A | Discharge status: Home / Self Care | Payer: Medicare PPO | Source: Ambulatory Visit | Attending: Urology | Admitting: Urology

## 2015-10-15 DIAGNOSIS — N281 Cyst of kidney, acquired: Secondary | ICD-10-CM | POA: Diagnosis not present

## 2015-10-15 DIAGNOSIS — K7689 Other specified diseases of liver: Secondary | ICD-10-CM | POA: Diagnosis not present

## 2015-10-16 DIAGNOSIS — I5031 Acute diastolic (congestive) heart failure: Secondary | ICD-10-CM | POA: Diagnosis not present

## 2015-10-16 DIAGNOSIS — L0390 Cellulitis, unspecified: Secondary | ICD-10-CM | POA: Diagnosis not present

## 2015-10-16 DIAGNOSIS — K922 Gastrointestinal hemorrhage, unspecified: Secondary | ICD-10-CM | POA: Diagnosis not present

## 2015-10-16 DIAGNOSIS — J156 Pneumonia due to other aerobic Gram-negative bacteria: Secondary | ICD-10-CM | POA: Diagnosis not present

## 2015-10-16 DIAGNOSIS — J449 Chronic obstructive pulmonary disease, unspecified: Secondary | ICD-10-CM | POA: Diagnosis not present

## 2015-10-16 DIAGNOSIS — M6281 Muscle weakness (generalized): Secondary | ICD-10-CM | POA: Diagnosis not present

## 2015-10-16 DIAGNOSIS — R2681 Unsteadiness on feet: Secondary | ICD-10-CM | POA: Diagnosis not present

## 2015-10-16 DIAGNOSIS — N186 End stage renal disease: Secondary | ICD-10-CM | POA: Diagnosis not present

## 2015-10-16 DIAGNOSIS — Z992 Dependence on renal dialysis: Secondary | ICD-10-CM | POA: Diagnosis not present

## 2015-10-16 DIAGNOSIS — D500 Iron deficiency anemia secondary to blood loss (chronic): Secondary | ICD-10-CM | POA: Diagnosis not present

## 2015-10-16 DIAGNOSIS — J150 Pneumonia due to Klebsiella pneumoniae: Secondary | ICD-10-CM | POA: Diagnosis not present

## 2015-10-16 DIAGNOSIS — I2510 Atherosclerotic heart disease of native coronary artery without angina pectoris: Secondary | ICD-10-CM | POA: Diagnosis not present

## 2015-10-18 DIAGNOSIS — J9621 Acute and chronic respiratory failure with hypoxia: Secondary | ICD-10-CM | POA: Diagnosis not present

## 2015-10-18 DIAGNOSIS — J440 Chronic obstructive pulmonary disease with acute lower respiratory infection: Secondary | ICD-10-CM | POA: Diagnosis not present

## 2015-10-18 DIAGNOSIS — J150 Pneumonia due to Klebsiella pneumoniae: Secondary | ICD-10-CM | POA: Diagnosis not present

## 2015-10-18 DIAGNOSIS — J156 Pneumonia due to other aerobic Gram-negative bacteria: Secondary | ICD-10-CM | POA: Diagnosis not present

## 2015-10-18 DIAGNOSIS — J189 Pneumonia, unspecified organism: Secondary | ICD-10-CM | POA: Diagnosis not present

## 2015-10-18 DIAGNOSIS — N186 End stage renal disease: Secondary | ICD-10-CM | POA: Diagnosis not present

## 2015-10-20 DIAGNOSIS — E1122 Type 2 diabetes mellitus with diabetic chronic kidney disease: Secondary | ICD-10-CM | POA: Diagnosis not present

## 2015-10-20 DIAGNOSIS — Z992 Dependence on renal dialysis: Secondary | ICD-10-CM | POA: Diagnosis not present

## 2015-10-20 DIAGNOSIS — N186 End stage renal disease: Secondary | ICD-10-CM | POA: Diagnosis not present

## 2015-10-21 DIAGNOSIS — J156 Pneumonia due to other aerobic Gram-negative bacteria: Secondary | ICD-10-CM | POA: Diagnosis not present

## 2015-10-21 DIAGNOSIS — N186 End stage renal disease: Secondary | ICD-10-CM | POA: Diagnosis not present

## 2015-10-21 DIAGNOSIS — J150 Pneumonia due to Klebsiella pneumoniae: Secondary | ICD-10-CM | POA: Diagnosis not present

## 2015-10-22 DIAGNOSIS — N281 Cyst of kidney, acquired: Secondary | ICD-10-CM | POA: Diagnosis not present

## 2015-10-23 DIAGNOSIS — J156 Pneumonia due to other aerobic Gram-negative bacteria: Secondary | ICD-10-CM | POA: Diagnosis not present

## 2015-10-23 DIAGNOSIS — N186 End stage renal disease: Secondary | ICD-10-CM | POA: Diagnosis not present

## 2015-10-23 DIAGNOSIS — J150 Pneumonia due to Klebsiella pneumoniae: Secondary | ICD-10-CM | POA: Diagnosis not present

## 2015-10-25 DIAGNOSIS — N186 End stage renal disease: Secondary | ICD-10-CM | POA: Diagnosis not present

## 2015-10-25 DIAGNOSIS — J156 Pneumonia due to other aerobic Gram-negative bacteria: Secondary | ICD-10-CM | POA: Diagnosis not present

## 2015-10-25 DIAGNOSIS — J150 Pneumonia due to Klebsiella pneumoniae: Secondary | ICD-10-CM | POA: Diagnosis not present

## 2015-10-28 DIAGNOSIS — J156 Pneumonia due to other aerobic Gram-negative bacteria: Secondary | ICD-10-CM | POA: Diagnosis not present

## 2015-10-28 DIAGNOSIS — J150 Pneumonia due to Klebsiella pneumoniae: Secondary | ICD-10-CM | POA: Diagnosis not present

## 2015-10-28 DIAGNOSIS — N186 End stage renal disease: Secondary | ICD-10-CM | POA: Diagnosis not present

## 2015-10-30 DIAGNOSIS — J150 Pneumonia due to Klebsiella pneumoniae: Secondary | ICD-10-CM | POA: Diagnosis not present

## 2015-10-30 DIAGNOSIS — J156 Pneumonia due to other aerobic Gram-negative bacteria: Secondary | ICD-10-CM | POA: Diagnosis not present

## 2015-10-30 DIAGNOSIS — N186 End stage renal disease: Secondary | ICD-10-CM | POA: Diagnosis not present

## 2015-11-01 DIAGNOSIS — N186 End stage renal disease: Secondary | ICD-10-CM | POA: Diagnosis not present

## 2015-11-01 DIAGNOSIS — J150 Pneumonia due to Klebsiella pneumoniae: Secondary | ICD-10-CM | POA: Diagnosis not present

## 2015-11-01 DIAGNOSIS — J156 Pneumonia due to other aerobic Gram-negative bacteria: Secondary | ICD-10-CM | POA: Diagnosis not present

## 2015-11-02 DIAGNOSIS — J449 Chronic obstructive pulmonary disease, unspecified: Secondary | ICD-10-CM | POA: Diagnosis not present

## 2015-11-04 DIAGNOSIS — J150 Pneumonia due to Klebsiella pneumoniae: Secondary | ICD-10-CM | POA: Diagnosis not present

## 2015-11-04 DIAGNOSIS — N186 End stage renal disease: Secondary | ICD-10-CM | POA: Diagnosis not present

## 2015-11-04 DIAGNOSIS — J156 Pneumonia due to other aerobic Gram-negative bacteria: Secondary | ICD-10-CM | POA: Diagnosis not present

## 2015-11-06 DIAGNOSIS — J156 Pneumonia due to other aerobic Gram-negative bacteria: Secondary | ICD-10-CM | POA: Diagnosis not present

## 2015-11-06 DIAGNOSIS — N186 End stage renal disease: Secondary | ICD-10-CM | POA: Diagnosis not present

## 2015-11-06 DIAGNOSIS — J150 Pneumonia due to Klebsiella pneumoniae: Secondary | ICD-10-CM | POA: Diagnosis not present

## 2015-11-08 DIAGNOSIS — J150 Pneumonia due to Klebsiella pneumoniae: Secondary | ICD-10-CM | POA: Diagnosis not present

## 2015-11-08 DIAGNOSIS — N186 End stage renal disease: Secondary | ICD-10-CM | POA: Diagnosis not present

## 2015-11-08 DIAGNOSIS — J156 Pneumonia due to other aerobic Gram-negative bacteria: Secondary | ICD-10-CM | POA: Diagnosis not present

## 2015-11-11 DIAGNOSIS — J150 Pneumonia due to Klebsiella pneumoniae: Secondary | ICD-10-CM | POA: Diagnosis not present

## 2015-11-11 DIAGNOSIS — N186 End stage renal disease: Secondary | ICD-10-CM | POA: Diagnosis not present

## 2015-11-11 DIAGNOSIS — J156 Pneumonia due to other aerobic Gram-negative bacteria: Secondary | ICD-10-CM | POA: Diagnosis not present

## 2015-11-12 ENCOUNTER — Ambulatory Visit: Payer: Medicare PPO | Admitting: Internal Medicine

## 2015-11-13 DIAGNOSIS — J156 Pneumonia due to other aerobic Gram-negative bacteria: Secondary | ICD-10-CM | POA: Diagnosis not present

## 2015-11-13 DIAGNOSIS — N186 End stage renal disease: Secondary | ICD-10-CM | POA: Diagnosis not present

## 2015-11-13 DIAGNOSIS — J150 Pneumonia due to Klebsiella pneumoniae: Secondary | ICD-10-CM | POA: Diagnosis not present

## 2015-11-14 DIAGNOSIS — M25561 Pain in right knee: Secondary | ICD-10-CM | POA: Diagnosis not present

## 2015-11-15 DIAGNOSIS — R2681 Unsteadiness on feet: Secondary | ICD-10-CM | POA: Diagnosis not present

## 2015-11-15 DIAGNOSIS — I2510 Atherosclerotic heart disease of native coronary artery without angina pectoris: Secondary | ICD-10-CM | POA: Diagnosis not present

## 2015-11-15 DIAGNOSIS — J150 Pneumonia due to Klebsiella pneumoniae: Secondary | ICD-10-CM | POA: Diagnosis not present

## 2015-11-15 DIAGNOSIS — M6281 Muscle weakness (generalized): Secondary | ICD-10-CM | POA: Diagnosis not present

## 2015-11-15 DIAGNOSIS — K922 Gastrointestinal hemorrhage, unspecified: Secondary | ICD-10-CM | POA: Diagnosis not present

## 2015-11-15 DIAGNOSIS — I5031 Acute diastolic (congestive) heart failure: Secondary | ICD-10-CM | POA: Diagnosis not present

## 2015-11-15 DIAGNOSIS — Z992 Dependence on renal dialysis: Secondary | ICD-10-CM | POA: Diagnosis not present

## 2015-11-15 DIAGNOSIS — D500 Iron deficiency anemia secondary to blood loss (chronic): Secondary | ICD-10-CM | POA: Diagnosis not present

## 2015-11-15 DIAGNOSIS — L0390 Cellulitis, unspecified: Secondary | ICD-10-CM | POA: Diagnosis not present

## 2015-11-15 DIAGNOSIS — N186 End stage renal disease: Secondary | ICD-10-CM | POA: Diagnosis not present

## 2015-11-15 DIAGNOSIS — J156 Pneumonia due to other aerobic Gram-negative bacteria: Secondary | ICD-10-CM | POA: Diagnosis not present

## 2015-11-15 DIAGNOSIS — J449 Chronic obstructive pulmonary disease, unspecified: Secondary | ICD-10-CM | POA: Diagnosis not present

## 2015-11-18 DIAGNOSIS — Z992 Dependence on renal dialysis: Secondary | ICD-10-CM | POA: Diagnosis not present

## 2015-11-18 DIAGNOSIS — N186 End stage renal disease: Secondary | ICD-10-CM | POA: Diagnosis not present

## 2015-11-20 DIAGNOSIS — N186 End stage renal disease: Secondary | ICD-10-CM | POA: Diagnosis not present

## 2015-11-20 DIAGNOSIS — E1122 Type 2 diabetes mellitus with diabetic chronic kidney disease: Secondary | ICD-10-CM | POA: Diagnosis not present

## 2015-11-20 DIAGNOSIS — Z992 Dependence on renal dialysis: Secondary | ICD-10-CM | POA: Diagnosis not present

## 2015-11-22 DIAGNOSIS — N186 End stage renal disease: Secondary | ICD-10-CM | POA: Diagnosis not present

## 2015-11-22 DIAGNOSIS — Z992 Dependence on renal dialysis: Secondary | ICD-10-CM | POA: Diagnosis not present

## 2015-11-25 DIAGNOSIS — J156 Pneumonia due to other aerobic Gram-negative bacteria: Secondary | ICD-10-CM | POA: Diagnosis not present

## 2015-11-25 DIAGNOSIS — N186 End stage renal disease: Secondary | ICD-10-CM | POA: Diagnosis not present

## 2015-11-25 DIAGNOSIS — J150 Pneumonia due to Klebsiella pneumoniae: Secondary | ICD-10-CM | POA: Diagnosis not present

## 2015-11-26 ENCOUNTER — Encounter: Payer: Self-pay | Admitting: Internal Medicine

## 2015-11-26 ENCOUNTER — Encounter: Payer: Self-pay | Admitting: Family Medicine

## 2015-11-26 ENCOUNTER — Telehealth: Payer: Self-pay | Admitting: Family Medicine

## 2015-11-26 ENCOUNTER — Ambulatory Visit (INDEPENDENT_AMBULATORY_CARE_PROVIDER_SITE_OTHER): Payer: Medicare PPO | Admitting: Internal Medicine

## 2015-11-26 ENCOUNTER — Ambulatory Visit (INDEPENDENT_AMBULATORY_CARE_PROVIDER_SITE_OTHER): Payer: Medicare PPO | Admitting: Family Medicine

## 2015-11-26 VITALS — BP 100/50 | HR 104 | Temp 98.2°F | Ht 67.5 in | Wt 161.4 lb

## 2015-11-26 VITALS — BP 90/54 | HR 59 | Ht 67.5 in | Wt 160.8 lb

## 2015-11-26 DIAGNOSIS — L03012 Cellulitis of left finger: Secondary | ICD-10-CM | POA: Diagnosis not present

## 2015-11-26 DIAGNOSIS — L600 Ingrowing nail: Secondary | ICD-10-CM | POA: Diagnosis not present

## 2015-11-26 DIAGNOSIS — J9611 Chronic respiratory failure with hypoxia: Secondary | ICD-10-CM | POA: Diagnosis not present

## 2015-11-26 DIAGNOSIS — J449 Chronic obstructive pulmonary disease, unspecified: Secondary | ICD-10-CM | POA: Diagnosis not present

## 2015-11-26 NOTE — Telephone Encounter (Signed)
Ok to send referral, but if he needs/wishes to be seen here ok to work in today or Thursday.

## 2015-11-26 NOTE — Telephone Encounter (Signed)
I called the pt and informed him of the message below.  Appt scheduled with Dr Maudie Mercury for today at 3:30pm.

## 2015-11-26 NOTE — Telephone Encounter (Signed)
Pt has an ingrown toenail and he can hardly walk.  Needs a referral to a foot doctor asap. Pt cannot go on Mon, wed, friday due to dialysis

## 2015-11-26 NOTE — Progress Notes (Signed)
Pre visit review using our clinic review tool, if applicable. No additional management support is needed unless otherwise documented below in the visit note. 

## 2015-11-26 NOTE — Progress Notes (Signed)
HPI:  Acute visit for:  Great toe pain: -Left -started about 1 week ago -pain along lateral nail of great toe -denies: fevers, malaise, significant swelling, drainage -wants to see podiatrist for ingrown toenail -feeling well otherwise -hx neuropathy in this foot, but this is different pain  ROS: See pertinent positives and negatives per HPI.  Past Medical History  Diagnosis Date  . Stroke ([place]) 2012  . High cholesterol   . Hypertension   . Kidney disease     CKD4, sees [facility], considering dialysis  . COPD (chronic obstructive pulmonary disease) ([place])   . CAD (coronary artery disease) [place], [name]  . Acute and chronic respiratory failure 10/22/2012  . Osteoarthritis 10/22/2012  . Pneumonia April 2014  . Atrial fibrillation ([place])   . Shortness of breath dyspnea     Past Surgical History  Procedure Laterality Date  . Coronary stent placement  1999    2 stents  . Appendectomy  1984  . Colon surgery  2012    partial colon removed - twisted bowel  . Hernia repair      Right inguinal  . Cystoscopy/retrograde/ureteroscopy Bilateral 04/23/2014    Procedure: CYSTOSCOPY BILATERAL RETROGRADE,  LEFT URETEROSCOPY, LEFT STENT PLACEMENT;  Surgeon: Raynelle Bring, MD;  Location: [facility] ORS;  Service: Urology;  Laterality: Bilateral;  . Av fistula placement Right 05/21/2015    Procedure: Right Arm Brachiocephalic ARTERIOVENOUS (AV) FISTULA CREATION;  Surgeon: Angelia Mould, MD;  Location[facility] OR;  Service: Vascular;  Laterality: Right;    Family History  Problem Relation Age of Onset  . Cancer Mother     Breast  . Heart disease Father   . Heart attack Father   . Parkinson's disease Brother     Social History   Social History  . Marital Status: Married    Spouse Name: N/A  . Number of Children: 3  . Years of Education: N/A   Occupational History  . retired-worked in radio    Social History Main Topics  . Smoking status: Former Smoker -- 1.00  packs/day for 49 years    Types: Cigarettes, Pipe, Cigars    Quit date: 02/21/2000  . Smokeless tobacco: Never Used  . Alcohol Use: No  . Drug Use: No  . Sexual Activity: Not Asked   Other Topics Concern  . None   Social History Narrative   Work or School: retired [employer] - [employer]      Home Situation: lives with wife in [place] regular; poor diet              Current outpatient prescriptions:  .  albuterol (VENTOLIN HFA) 108 (90 Base) MCG/ACT inhaler, Inhale 2 puffs into the lungs every 4 (four) hours as needed for wheezing or shortness of breath., Disp: 1 Inhaler, Rfl: 11 .  aspirin EC 325 MG tablet, Take 1 tablet (325 mg total) by mouth daily., Disp: 30 tablet, Rfl: 0 .  atorvastatin (LIPITOR) 20 MG tablet, Take 1 tablet (20 mg total) by mouth daily., Disp: 30 tablet, Rfl: 6 .  benzonatate (TESSALON) 200 MG capsule, One four times a day as needed, Disp: 60 capsule, Rfl: 11 .  budesonide-formoterol (SYMBICORT) 160-4.5 MCG/ACT inhaler, Inhale 2 puffs into the lungs 2 (two) times daily., Disp: 3 Inhaler, Rfl: 3 .  cholecalciferol (VITAMIN D) 1000 units tablet, Take 1,000 Units by mouth daily., Disp: , Rfl:  .  guaiFENesin (MUCINEX) 600 MG 12 hr tablet, Take 2 tablets (1,200 mg total) by mouth 2 (two) times daily. (Patient taking differently: Take 1,200 mg by mouth 2 (two) times daily as needed. ), Disp: 14 tablet, Rfl: 0 .  methocarbamol (ROBAXIN) 500 MG tablet, Take 1 tablet (500 mg total) by mouth every 8 (eight) hours as needed for muscle spasms., Disp: 30 tablet, Rfl: 0 .  metoprolol tartrate (LOPRESSOR) 25 MG tablet, Take 0.5 tablets (12.5 mg total) by mouth 2 (two) times daily., Disp: 30 tablet, Rfl: 11 .  OXYGEN, 2lpm with sleep and as needed  AHC, Disp: , Rfl:  .  polyethylene glycol (MIRALAX) packet, Take 17 g by mouth daily as needed for moderate constipation., Disp: 30 each, Rfl: 0 .  predniSONE (DELTASONE) 10 MG  tablet, 10 alternating with 5 mg daily, Disp: , Rfl:  .  senna-docusate (SENOKOT S) 8.6-50 MG tablet, Take 2 tablets by mouth at bedtime as needed for mild constipation., Disp: 60 tablet, Rfl: 3 .  tiotropium (SPIRIVA HANDIHALER) 18 MCG inhalation capsule, INHALE THE CONTENTS OF ONE CAPSULE INTO LUNGS ONCE DAILY, Disp: 90 capsule, Rfl: 3  EXAM:  Filed Vitals:   11/26/15 1534  BP: 100/50  Pulse: 104  Temp: 98.2 F (36.8 C)    Body mass index is 24.89 kg/(m^2).  GENERAL: vitals reviewed and listed above, alert, oriented, appears well hydrated and in no acute distress  HEENT: atraumatic, conjunttiva clear, no obvious abnormalities on inspection of external nose and ears  NECK: no obvious masses on inspection  MS: moves all extremities without noticeable abnormality, walks with a cane  SKIN: Very mild erythema and swelling along the medial aspect of the left great toe nail, no fluctuance, no joint swelling, minimally ingrown toenail with nail thickening  PSYCH: pleasant and cooperative, no obvious depression or anxiety  ASSESSMENT AND PLAN:  Discussed the following assessment and plan:  Paronychia, left - Plan: Ambulatory referral to Podiatry  Ingrown toenail - Plan: Ambulatory referral to Podiatry  -Discussed treatment options, very mild paronychia - advised warm soaks and topical antibiotic ointment -He does wish to see a podiatrist and the referral was placed -Patient advised to return or notify a doctor immediately if symptoms worsen or persist or new concerns arise.  Patient Instructions  Warm salt water or soapy soaks  Minutes twice daily for the toe. Then dry and massage in the antibiotic ointment.  We placed a referral for you as discussed to the foot doctor as you requested. It usually takes about 1-2 weeks to process and schedule this referral. If you have not heard from [place] regarding this appointment in 2 weeks please contact our office.  Follow up if worsening or  persists.      Terry Benton R.

## 2015-11-26 NOTE — Progress Notes (Signed)
Subjective:    Patient ID: Terry [name], male    DOB: September 17, 1934  MRN: [id number]   Brief patient profile:  46 yowm quit smoking 2001 bothered by doe seemed better then worse in 2010 p "lung infection" and stayed on advair/spiriva  Combo since 2012 seemed better then admitted in [facility] with pna 10/09/12  in then transferred to [facility] x 3 weeks and discharged  On May 1st  2014 referred to pulmonary clinic 11/29/12  by [facility] for copd eval and proved to have GOLD III copd 01/26/13 with symptoms much better off ACEi.   HPI 11/29/2012 1st pulmonary ov/ Terry [name]/ on ACEi/ cc doe x maybe a block of walking but not back to baseline and able to do 5 steps and has proaire but not needing. Also persistent dry daytime cough x months >d/c ACE    Admission date: 04/12/2014 Discharge Date: 04/20/2014    Admission Diagnosis Chronic kidney disease, stage III (moderate) [N18.3] Hyperkalemia [E87.5] Left flank pain [R10.9] Cyst of left kidney [Q61.00] COPD mixed type [J44.9] Essential hypertension [I10] Abdominal pain [R10.9]   Discharge Diagnosis Chronic kidney disease, stage III (moderate) [N18.3] Hyperkalemia [E87.5] Left flank pain [R10.9] Cyst of left kidney [Q61.00] COPD mixed type [J44.9] Essential hypertension [I10] Abdominal pain [R10.9]   Principal Problem:  Left flank pain  COPD GOLD III  Chronic kidney disease, stage III (moderate)  CAD (coronary artery disease) of artery bypass graft  HTN (hypertension)  Hyperkalemia   Hospital Course  1. Left flank pain - due to hemorrhage and a renal cyst repeat CT confirms, seen by urologist D/W Dr Alinda Money on 04-14-14, likely cystoscopy outpatient, will continue to follow closely his symptoms and renal function. CT showed some perinephric stranding on the left side, had ++ leukocytosis resolved on empiric IV Rocephin will switch to 4 more days of PO Ceftin on 04-20-14, follow with PCP, renal and urology. 2. Acute renal  failure on top of chronic disease stage IV. Baseline creatinine around 2. Also had some bladder outlet obstruction. Foley was placed, he was hydrated, ACE inhibitor and Lasix held. Renal function has steadily improved. Appreciate nephrology input. We will follow post discharge with nephrology and PCP. 3. Hyponatremia - Per renal. Improving with self diuresis. Repeat BMP in 3-4 days. 4. Hypertension. Stable on beta blocker and Norvasc continue. ACE and Lasix discontinued due to acute renal failure. 5. CAD. No acute issue. Continue beta blocker, hold aspirin for now.  6.Constipation. Placed on bowel regimen and monitor.  7. Shortness of breath. Likely due to combination of mild fluid overload and questionable COPD exacerbation, much improved. The fluids were held and he is being self diuresing, also after a trial of IV steroids. Still requiring 2 L of nasal cannula oxygen down to 5. Much improved after pulmonary toiletry, he be discharged with nebulizer treatments on a steroid taper and home oxygen. Steadily improving with ongoing self diuresis   05/28/2014 transition of care/ post hosp f/u ov/Terry [name] re:  Chief Complaint  Patient presents with  . HFU    Breathing has improved some, but not back to his normal  baseline. He gets SOB walking minimal distances.    still on advair/ saba twice daily helps some - no symptoms at rest or sleeping  rec Try symbicort 160  Take 2 puffs first thing in am and then another 2 puffs about 12 hours later.  Only use your albuterol as needed   08/20/2014 NP  Follow up COPD  Patient  returns for a 6 week follow-up. Patient reports that he has good days and bad. Patient was previously on [facility] after his last hospitalization and would like to try this inhaler instead of Advair He feels that he had improved control of his breathing on this He denies any chest pain, orthopnea, PND, increased leg swelling or hemoptysis. Chest x-ray last visit with COPD changes. No acute  process noted. rec May try Dulera 200 2 puffs Twice daily  , rinse after use.  Continue on Spiriva daily .    10/22/2014 f/u ov/Terry [name] re: GOLD III on dulera 200 2 bid and spiriva/ no need for saba / not using med calendar  Chief Complaint  Patient presents with  . Follow-up    Pt states that his breathing has improved since the last visit.   doe improves and now able to walk mailbox and back s stopping  = MMRC1  rec Please schedule a follow up visit in 3 months but call sooner if needed with pfts and bring all inhalers with you     02/26/2015 f/u ov/Terry [name] re:  GOLD III/ no 02 on symb/ spiriva dpi  Chief Complaint  Patient presents with  . Follow-up    Pt states that his breathing is unchanged. He is having increased cough x 3 wks- prod with white sputum.   No need for rescue rx Doe x MMRC1 = can walk nl pace, flat grade, can't hurry or go uphills or steps s sob  s 02  rec  No change in medications - your weight and kidney function are clearly an issue and I will let Dr Joelyn Oms know about the HCO3 issue Let the symbicort and spiriva go out thru your nose  Only use your albuterol (ventolin) as a rescue medication   Please schedule a follow up visit in 3 months but call sooner if needed bring all inhalers with you and fill the spiriva respimat before the visit - late add: ? Bisoprolol instead of lopressor   Admit date: 08/14/2015 Discharge date: 08/19/2015 Discharge Diagnoses:  Active Problems:  CAD (coronary artery disease) of artery bypass graft  COPD exacerbation (HCC)  Acute respiratory failure with hypoxia (HCC)  Chronic atrial fibrillation (HCC)  Chronic diastolic heart failure (HCC)  Anemia of chronic disease  ESRD (end stage renal disease) on dialysis ([place])   08/27/2015  Extended post hosp /transition of care  f/u ov/Terry [name] re: recurrent admits/ now chronic resp failure/HD dep CRI plus GOLD III rx symb/spriiva/neb saba Chief Complaint  Patient presents with  . Follow-up     Pt c/o occasional cough with white mucus. Pt denies wheeze/increased SOB/CP/tightness. Pt is on O2/2L, he states that on 3L it dries out his nose and burns. Pt also c/o low BP readings and wonders if some of his HTN meds need to be changed.   now sleeping in a recliner since admit 30-45 degrees due to sob/some noct cough but excess/ purulent sputum or mucus plugs   On prednisone with floor of   @ 10 mg since d/c 08/19/15  rec May need to consider adding Oracit to your regimen and I will le Dr Lorrene Reid know You will need to adjust your 02 to saturations of over 90%  Best fit and humidified 02 ordered  Plan A = Automatic = Symbicort 160 2 in am and 2 in pm with spiriva each am and prednisone 10 mg with breakfast daily  Work on inhaler technique:  Plan B = Backup Only use  your albuterol (proventil) as a rescue medication Plan C = Crisis - only use your albuterol nebulizer if you first try Plan B and it fails to help > ok to use the nebulizer up to every 4 hours but if start needing it regularly call for immediate appointment   09/26/2015  f/u ov/Terry [name] re: COPD III on symbicort 160/spiriva and 02 2lpm 24/7  Chief Complaint  Patient presents with  . Follow-up    Cough has improved some- requesting refill on tessalon.   doe/ weak x room to room  Prednisone 10 mg daily floor for now  No need for saba or neb  At this point  rec Prednisone 10 mg one on even and one-half on odd days x 2 weeks and if not change then reduce to 5 mg daily  Work on maintaining perfect inhaler technique:   Goal for 02 is 90% or above / ok adjust up  Or down or off as along as attaining this goal     11/26/2015  f/u ov/Terry [name] re: symb/spiriva/ 02 1lpm hs and prn daytime  / still on pred 10 mg daily (maint rx since 08/19/2015 ) Chief Complaint  Patient presents with  . Follow-up    Breathing is doing well. He has not been using rescue inhaler or neb. He has not used o2 much during the day.   never tapered pred as rec "I  was afraid to" No obvious day to day or daytime variabilty or assoc excess/ purulent sputum or mucus plugs    cp or chest tightness, subjective wheeze overt sinus or hb symptoms. No unusual exp hx or h/o childhood pna/ asthma or knowledge of premature birth.   Sleeping ok without nocturnal  or early am exacerbation  of respiratory  c/o's or need for noct saba. Also denies any obvious fluctuation of symptoms with weather or environmental changes or other aggravating or alleviating factors except as outlined above   Current Medications, Allergies, Complete Past Medical History, Past Surgical History, Family History, and Social History were reviewed in [employer] record.  ROS  The following are not active complaints unless bolded sore throat, dysphagia, dental problems, itching, sneezing,  nasal congestion or excess/ purulent secretions, ear ache,   fever, chills, sweats, unintended wt loss, pleuritic or exertional cp, hemoptysis,  orthopnea pnd or leg swelling, presyncope, palpitations, heartburn, abdominal pain, anorexia, nausea, vomiting, diarrhea  or change in bowel or urinary habits, change in stools or urine, dysuria,hematuria,  rash, arthralgias, visual complaints, headache, numbness weakness improving or ataxia or problems with walking or coordination,  change in mood/affect or memory.           Objective:   Physical Exam  Somber amb wm nad  02/26/2015          210 >  08/27/2015 163 p HD > 09/26/2015  159 > 11/26/2015  160     10/22/14 202 lb (91.627 kg)  08/28/14 203 lb 8 oz (92.307 kg)  08/20/14 205 lb 6.4 oz (93.169 kg)    Vital signs reviewed   HEENT: nl dentition, turbinates, and oropharynx. Nl external ear canals without cough reflex   NECK :  without JVD/Nodes/TM/ nl carotid upstrokes bilaterally   LUNGS: no acc muscle use,  Barrel chest, moderate, with increased Texp but min exp rhonchi  CV:  RRR  no s3 or murmur or increase in P2, no edema   ABD:   soft and nontender with nl inspiratory excursion in the supine  position. No bruits or organomegaly, bowel sounds nl  MS:  Nl gait/ ext warm without deformities, calf tenderness, cyanosis or clubbing No obvious joint restrictions   SKIN: warm and dry without lesions    NEURO:  alert, approp, nl sensorium with  no motor deficits       I personally reviewed images and agree with radiology impression as follows:  CXR:  08/14/15 Stable exam. Emphysema with cardiomegaly and bibasilar atelectasis.       Assessment & Plan:

## 2015-11-26 NOTE — Patient Instructions (Signed)
Warm salt water or soapy soaks  Minutes twice daily for the toe. Then dry and massage in the antibiotic ointment.  We placed a referral for you as discussed to the foot doctor as you requested. It usually takes about 1-2 weeks to process and schedule this referral. If you have not heard from [place] regarding this appointment in 2 weeks please contact our office.  Follow up if worsening or persists.

## 2015-11-26 NOTE — Patient Instructions (Addendum)
Try prednisone 10 mg one half daily   Plan A = Automatic = symbicort and spiriva   Plan B = Backup Only use your albuterol (ventollin) as a rescue medication to be used if you can't catch your breath by resting or doing a relaxed purse lip breathing pattern.  - The less you use it, the better it will work when you need it. - Ok to use the inhaler up to 2 puffs  every 4 hours if you must but call for appointment if use goes up over your usual need - Don't leave home without it !!  (think of it like the spare tire for your car)   Plan C = Crisis - only use your albuterol nebulizer if you first try Plan B and it fails to help > ok to use the nebulizer up to every 4 hours but if start needing it regularly call for immediate appointment   Plan D = Doctor - call me if B and C not adequate  Plan E = ER - go to ER or call 911 if all else fails      Please schedule a follow up office visit in 6 weeks, call sooner if needed

## 2015-11-27 DIAGNOSIS — N186 End stage renal disease: Secondary | ICD-10-CM | POA: Diagnosis not present

## 2015-11-27 DIAGNOSIS — J150 Pneumonia due to Klebsiella pneumoniae: Secondary | ICD-10-CM | POA: Diagnosis not present

## 2015-11-27 DIAGNOSIS — J156 Pneumonia due to other aerobic Gram-negative bacteria: Secondary | ICD-10-CM | POA: Diagnosis not present

## 2015-11-27 NOTE — Assessment & Plan Note (Signed)
-   Trial off ACEi 11/30/2012 >>> much better symptom control - 05/28/2014   Walked RA x one lap @ 185 stopped due to  [employer] / sats ok/ mod pace - PFT's 01/26/13      FEV1  1.28 (48%) ratio 48 and no better p B2,  DLCO 39 corrects to 48%  - PFTs 02/26/2015   FEV1 0.99  (35 % ) ratio 35  p 11 % improvement from saba with DLCO  24 % corrects to 35  % for alv volume  p am symb/spiriva    - Pred maint rx with floor of 10 mg per day at d/c 08/14/15  - 09/26/2015  extensive coaching HFA effectiveness =    75%  - 11/26/2015 rec reduce pred to 5 mg daily  The goal with a chronic steroid dependent illness is always arriving at the lowest effective dose that controls the disease/symptoms and not accepting a set "formula" which is based on statistics or guidelines that don't always take into account patient  variability or the natural hx of the dz in every individual patient, which may well vary over time.  For now therefore I recommend the patient maintain  Floor of 5 mg daily/ warned re looking for flare of AB or adrenal insufficiency symptoms with taper   I had an extended discussion with the patient reviewing all relevant studies completed to date and  lasting 15 to 20 minutes of a 25 minute visit    Each maintenance medication was reviewed in detail including most importantly the difference between maintenance and prns and under what circumstances the prns are to be triggered using an action plan format that is not reflected in the computer generated alphabetically organized AVS.    Please see instructions for details which were reviewed in writing and the patient given a copy highlighting the part that I personally wrote and discussed at today's ov.

## 2015-11-27 NOTE — Assessment & Plan Note (Signed)
02 2.5  lpm 24/7 as of d/c 08/14/15   As of 11/26/2015 = 1lpm sleeping, prn daytime with sats  96% RA at rest

## 2015-11-29 DIAGNOSIS — J150 Pneumonia due to Klebsiella pneumoniae: Secondary | ICD-10-CM | POA: Diagnosis not present

## 2015-11-29 DIAGNOSIS — N186 End stage renal disease: Secondary | ICD-10-CM | POA: Diagnosis not present

## 2015-11-29 DIAGNOSIS — J156 Pneumonia due to other aerobic Gram-negative bacteria: Secondary | ICD-10-CM | POA: Diagnosis not present

## 2015-12-02 DIAGNOSIS — J156 Pneumonia due to other aerobic Gram-negative bacteria: Secondary | ICD-10-CM | POA: Diagnosis not present

## 2015-12-02 DIAGNOSIS — N186 End stage renal disease: Secondary | ICD-10-CM | POA: Diagnosis not present

## 2015-12-02 DIAGNOSIS — J150 Pneumonia due to Klebsiella pneumoniae: Secondary | ICD-10-CM | POA: Diagnosis not present

## 2015-12-03 ENCOUNTER — Telehealth: Payer: Self-pay | Admitting: *Deleted

## 2015-12-03 DIAGNOSIS — J449 Chronic obstructive pulmonary disease, unspecified: Secondary | ICD-10-CM | POA: Diagnosis not present

## 2015-12-03 NOTE — Telephone Encounter (Signed)
Dr Maudie Mercury received faxed paperwork to be completed for FMLA for the pts wife.  She stated to call Mrs Daggett and ask where what these needed to be completed for and stated if this is for his kidney disease it should be completed by his kidney specialist or if this is for is COPD or heart problem to have the treating doctor complete this.  Mrs Atherton stated she needs this to be able to pick the pt up from his dialysis appts and I advised her per Dr Maudie Mercury she contact the pts kidney doctor and she stated it is hard to get that doctor to do this as he only sees her at the dialysis center not at the office.  I offered to fax the paperwork if needed to help her and she stated she has the originals and will send to his kidney doctor.  She stated she does not understand why Dr Maudie Mercury will not complete the forms as she was told to send these to her PCP.  I informed her of the message again per Dr Maudie Mercury.

## 2015-12-03 NOTE — Telephone Encounter (Signed)
Spoke with pt. FMLA is for his kidney disease and tx, not for something we are managing. Would be much simpler for nephrologist to complete the required questions and documentation and forms. Pt agreed. Advised he let us know if any other questions or if we can help in anyway.

## 2015-12-04 ENCOUNTER — Telehealth: Payer: Self-pay | Admitting: Cardiovascular Disease

## 2015-12-04 DIAGNOSIS — J150 Pneumonia due to Klebsiella pneumoniae: Secondary | ICD-10-CM | POA: Diagnosis not present

## 2015-12-04 DIAGNOSIS — N186 End stage renal disease: Secondary | ICD-10-CM | POA: Diagnosis not present

## 2015-12-04 DIAGNOSIS — J156 Pneumonia due to other aerobic Gram-negative bacteria: Secondary | ICD-10-CM | POA: Diagnosis not present

## 2015-12-04 NOTE — Telephone Encounter (Signed)
Left message on call back number ( which is wife's identified voicemail) to call office if time today. Otherwise I will try today after 5

## 2015-12-04 NOTE — Telephone Encounter (Signed)
Spoke with pt. He reports BP was very low last Friday at dialysis so he stopped lopressor. He was afraid to resume.  Chart reviewed and phone note on 4/24 states to try lopressor 12.5 twice daily and to stop if unable to tolerate that dose.  I told pt he could stay off lopressor. Pt is asking if there is another medication he can try to control heart rate and keep blood pressure up.  He reports blood pressure at dialysis was 125/60 at start of dialysis and dropped to 95/?.  He does not know heart rate at dialysis but states at home it has been running 105-110.  I told pt I would send to Dr. [name] for recommendations. Pt aware Dr. [name] is not in office this week and he is OK to wait to hear on this until next week.

## 2015-12-04 NOTE — Telephone Encounter (Signed)
New Message:  Please call today after 5,he will be at dialysis until that time. This is concerning his medicine.

## 2015-12-05 ENCOUNTER — Ambulatory Visit (INDEPENDENT_AMBULATORY_CARE_PROVIDER_SITE_OTHER): Payer: Medicare PPO | Admitting: Podiatry

## 2015-12-05 ENCOUNTER — Encounter: Payer: Self-pay | Admitting: Podiatry

## 2015-12-05 VITALS — BP 136/65 | HR 100 | Resp 14

## 2015-12-05 DIAGNOSIS — B351 Tinea unguium: Secondary | ICD-10-CM

## 2015-12-05 DIAGNOSIS — M79676 Pain in unspecified toe(s): Secondary | ICD-10-CM

## 2015-12-05 NOTE — Progress Notes (Signed)
   Subjective:    Patient ID: Terry [name], male    DOB: 1934-11-03, 81 y.o.   MRN: [id number]  HPI this patient presents to my office with chief complaint of a painful ingrowing nail on the big toe left foot. He states that the nail is curving in to the nail border and causing significant pain at the tip of the inside big toe. He states he is unable to self treat. Patient does give a history of a back injury which causes numbness into the toes on both feet. He also describes having significant kidney failure as well as heart disease. He presents the office today for definitive evaluation and treatment of his nails.    Review of Systems  All other systems reviewed and are negative.      Objective:   Physical Exam GENERAL APPEARANCE: Alert, conversant. Appropriately groomed. No acute distress.  VASCULAR: Pedal pulses are  palpable at  [facility] and PT bilateral.  Capillary refill time is immediate to all digits,  Normal temperature gradient.   NEUROLOGIC: sensation is normal to 5.07 monofilament at 5/5 sites bilateral.  Light touch is intact bilateral, Muscle strength normal.  MUSCULOSKELETAL: acceptable muscle strength, tone and stability bilateral.  Intrinsic muscluature intact bilateral.  Rectus appearance of foot and digits noted bilateral.   DERMATOLOGIC: skin color, texture, and turgor are within normal limits.  No preulcerative lesions or ulcers  are seen, no interdigital maceration noted.  No open lesions present.   No drainage noted.  NAILS Thick disfigured discolored nails both feet.  No signs of redness or swelling noted.Marked incurvation medial aspect left hallux.         Assessment & Plan:  Onychomycosis  B/L  Ingrowing Toenails B/l  IE  Debridement of elongated ingrown toenails B/l  RTC 10 weeks.Patient was unable to tolerate the use of dremel.   Gardiner Barefoot DPM

## 2015-12-06 DIAGNOSIS — N186 End stage renal disease: Secondary | ICD-10-CM | POA: Diagnosis not present

## 2015-12-06 DIAGNOSIS — J156 Pneumonia due to other aerobic Gram-negative bacteria: Secondary | ICD-10-CM | POA: Diagnosis not present

## 2015-12-06 DIAGNOSIS — J150 Pneumonia due to Klebsiella pneumoniae: Secondary | ICD-10-CM | POA: Diagnosis not present

## 2015-12-09 DIAGNOSIS — N186 End stage renal disease: Secondary | ICD-10-CM | POA: Diagnosis not present

## 2015-12-09 DIAGNOSIS — J156 Pneumonia due to other aerobic Gram-negative bacteria: Secondary | ICD-10-CM | POA: Diagnosis not present

## 2015-12-09 DIAGNOSIS — J150 Pneumonia due to Klebsiella pneumoniae: Secondary | ICD-10-CM | POA: Diagnosis not present

## 2015-12-09 NOTE — Telephone Encounter (Signed)
AFTER MUCH DISCUSSION  PT  AWARE  TO MONITOR  HEART  RATE  AND IF  CONSISTENTLY RUNS GREATER THAN   110   TO CALL BACK.PT  VERBALIZED  UNDERSTANDING./CY

## 2015-12-09 NOTE — Telephone Encounter (Signed)
I do not think we can safely add any rate control agents with his hypotension on dialysis. Can we let him know that he should call us if his heart rate is consistently over 110 bpm? Thanks, chris

## 2015-12-11 DIAGNOSIS — N186 End stage renal disease: Secondary | ICD-10-CM | POA: Diagnosis not present

## 2015-12-11 DIAGNOSIS — J156 Pneumonia due to other aerobic Gram-negative bacteria: Secondary | ICD-10-CM | POA: Diagnosis not present

## 2015-12-11 DIAGNOSIS — J150 Pneumonia due to Klebsiella pneumoniae: Secondary | ICD-10-CM | POA: Diagnosis not present

## 2015-12-13 DIAGNOSIS — J156 Pneumonia due to other aerobic Gram-negative bacteria: Secondary | ICD-10-CM | POA: Diagnosis not present

## 2015-12-13 DIAGNOSIS — J150 Pneumonia due to Klebsiella pneumoniae: Secondary | ICD-10-CM | POA: Diagnosis not present

## 2015-12-13 DIAGNOSIS — N186 End stage renal disease: Secondary | ICD-10-CM | POA: Diagnosis not present

## 2015-12-16 DIAGNOSIS — I5031 Acute diastolic (congestive) heart failure: Secondary | ICD-10-CM | POA: Diagnosis not present

## 2015-12-16 DIAGNOSIS — N186 End stage renal disease: Secondary | ICD-10-CM | POA: Diagnosis not present

## 2015-12-16 DIAGNOSIS — I2510 Atherosclerotic heart disease of native coronary artery without angina pectoris: Secondary | ICD-10-CM | POA: Diagnosis not present

## 2015-12-16 DIAGNOSIS — D500 Iron deficiency anemia secondary to blood loss (chronic): Secondary | ICD-10-CM | POA: Diagnosis not present

## 2015-12-16 DIAGNOSIS — J449 Chronic obstructive pulmonary disease, unspecified: Secondary | ICD-10-CM | POA: Diagnosis not present

## 2015-12-16 DIAGNOSIS — K922 Gastrointestinal hemorrhage, unspecified: Secondary | ICD-10-CM | POA: Diagnosis not present

## 2015-12-16 DIAGNOSIS — J150 Pneumonia due to Klebsiella pneumoniae: Secondary | ICD-10-CM | POA: Diagnosis not present

## 2015-12-16 DIAGNOSIS — R2681 Unsteadiness on feet: Secondary | ICD-10-CM | POA: Diagnosis not present

## 2015-12-16 DIAGNOSIS — J156 Pneumonia due to other aerobic Gram-negative bacteria: Secondary | ICD-10-CM | POA: Diagnosis not present

## 2015-12-16 DIAGNOSIS — M6281 Muscle weakness (generalized): Secondary | ICD-10-CM | POA: Diagnosis not present

## 2015-12-16 DIAGNOSIS — L0390 Cellulitis, unspecified: Secondary | ICD-10-CM | POA: Diagnosis not present

## 2015-12-16 DIAGNOSIS — Z992 Dependence on renal dialysis: Secondary | ICD-10-CM | POA: Diagnosis not present

## 2015-12-18 DIAGNOSIS — N186 End stage renal disease: Secondary | ICD-10-CM | POA: Diagnosis not present

## 2015-12-18 DIAGNOSIS — J156 Pneumonia due to other aerobic Gram-negative bacteria: Secondary | ICD-10-CM | POA: Diagnosis not present

## 2015-12-18 DIAGNOSIS — J150 Pneumonia due to Klebsiella pneumoniae: Secondary | ICD-10-CM | POA: Diagnosis not present

## 2015-12-19 ENCOUNTER — Ambulatory Visit (INDEPENDENT_AMBULATORY_CARE_PROVIDER_SITE_OTHER): Payer: Medicare PPO | Admitting: Podiatry

## 2015-12-19 ENCOUNTER — Encounter: Payer: Self-pay | Admitting: Podiatry

## 2015-12-19 VITALS — BP 85/52 | HR 106 | Resp 14

## 2015-12-19 DIAGNOSIS — L600 Ingrowing nail: Secondary | ICD-10-CM

## 2015-12-19 DIAGNOSIS — L03032 Cellulitis of left toe: Secondary | ICD-10-CM | POA: Diagnosis not present

## 2015-12-19 NOTE — Progress Notes (Signed)
Subjective:     Patient ID: Terry [name], male   DOB: 05-13-1935, 81 y.o.   MRN: [id number]  HPI this patient returns to the office stating that he still having significant pain on the inside border the big toe of the left foot. Patient was seen in the office on 12/05/2015 for treatment for the long and ingrown nails. He says the toe is painful walking and wearing his shoes. Patient does have significant back injury kidney failure and heart disease. He presents the office today for an evaluation and treatment of his painful toe   Review of Systems     Objective:   Physical Exam GENERAL APPEARANCE: Alert, conversant. Appropriately groomed. No acute distress.  VASCULAR: Pedal pulses are  palpable at  [facility] and PT bilateral.  Capillary refill time is immediate to all digits,  Normal temperature gradient.  Digital hair growth is present bilateral  NEUROLOGIC: sensation is normal to 5.07 monofilament at 5/5 sites bilateral.  Light touch is intact bilateral, Muscle strength normal.  MUSCULOSKELETAL: acceptable muscle strength, tone and stability bilateral.  Intrinsic muscluature intact bilateral.  Rectus appearance of foot and digits noted bilateral.   DERMATOLOGIC: skin color, texture, and turgor are within normal limits.  No preulcerative lesions or ulcers  are seen, no interdigital maceration noted.  No open lesions present.  No drainage noted    NAILS  Marked incurvation medial border left great toe.  Redness and pain along the medial border.      Assessment:     Ingrown Toenail medial border left foot     Plan:     ROV  Nail surgery.  Treatment options and alternatives discussed.  Recommended permanent phenol matrixectomy and patient agreed. Left hallux  was prepped with alcohol and a toe block of 3cc of 2% lidocaine plain was administered in a digital toe block. .  The toe was then prepped with betadine solution .  The offending nail border was then excised and matrix tissue exposed.    Antibiotic ointment and a dry sterile dressing was applied.  The patient was dispensed instructions for aftercare.      Gardiner Barefoot DPM

## 2015-12-20 DIAGNOSIS — J156 Pneumonia due to other aerobic Gram-negative bacteria: Secondary | ICD-10-CM | POA: Diagnosis not present

## 2015-12-20 DIAGNOSIS — Z992 Dependence on renal dialysis: Secondary | ICD-10-CM | POA: Diagnosis not present

## 2015-12-20 DIAGNOSIS — N186 End stage renal disease: Secondary | ICD-10-CM | POA: Diagnosis not present

## 2015-12-20 DIAGNOSIS — E1122 Type 2 diabetes mellitus with diabetic chronic kidney disease: Secondary | ICD-10-CM | POA: Diagnosis not present

## 2015-12-20 DIAGNOSIS — J150 Pneumonia due to Klebsiella pneumoniae: Secondary | ICD-10-CM | POA: Diagnosis not present

## 2015-12-23 DIAGNOSIS — N186 End stage renal disease: Secondary | ICD-10-CM | POA: Diagnosis not present

## 2015-12-23 DIAGNOSIS — Z992 Dependence on renal dialysis: Secondary | ICD-10-CM | POA: Diagnosis not present

## 2015-12-25 DIAGNOSIS — N186 End stage renal disease: Secondary | ICD-10-CM | POA: Diagnosis not present

## 2015-12-25 DIAGNOSIS — J156 Pneumonia due to other aerobic Gram-negative bacteria: Secondary | ICD-10-CM | POA: Diagnosis not present

## 2015-12-25 DIAGNOSIS — J150 Pneumonia due to Klebsiella pneumoniae: Secondary | ICD-10-CM | POA: Diagnosis not present

## 2015-12-26 ENCOUNTER — Encounter: Payer: Self-pay | Admitting: Podiatry

## 2015-12-26 ENCOUNTER — Ambulatory Visit (INDEPENDENT_AMBULATORY_CARE_PROVIDER_SITE_OTHER): Payer: Medicare PPO | Admitting: Podiatry

## 2015-12-26 DIAGNOSIS — Z09 Encounter for follow-up examination after completed treatment for conditions other than malignant neoplasm: Secondary | ICD-10-CM

## 2015-12-26 NOTE — Progress Notes (Signed)
Patient ID: Terry [name], male   DOB: 11-16-1934, 81 y.o.   MRN: [id number] This patient returns to the office following nail surgery one week ago.  The patient says toe has been soaked and bandaged as directed.  There has been improvement of the toe since the surgery has been performed. The patient presents for continued evaluation and treatment.  GENERAL APPEARANCE: Alert, conversant. Appropriately groomed. No acute distress.  VASCULAR: Pedal pulses palpable at  [facility] and PT bilateral.  Capillary refill time is immediate to all digits,  Normal temperature gradient.    NEUROLOGIC: sensation is normal to 5.07 monofilament at 5/5 sites bilateral.  Light touch is intact bilateral, Muscle strength normal.  MUSCULOSKELETAL: acceptable muscle strength, tone and stability bilateral.  Intrinsic muscluature intact bilateral.  Rectus appearance of foot and digits noted bilateral.   DERMATOLOGIC: skin color, texture, and turgor are within normal limits.  No preulcerative lesions or ulcers  are seen, no interdigital maceration noted.   NAILS  There is necrotic tissue along the nail groove  In the absence of redness swelling and pain.  DX  S/p nail surgery  ROV  Home instructions were discussed.  Patient to call the office if there are any questions or concerns.   Gardiner Barefoot DPM

## 2015-12-27 DIAGNOSIS — J156 Pneumonia due to other aerobic Gram-negative bacteria: Secondary | ICD-10-CM | POA: Diagnosis not present

## 2015-12-27 DIAGNOSIS — N186 End stage renal disease: Secondary | ICD-10-CM | POA: Diagnosis not present

## 2015-12-27 DIAGNOSIS — J150 Pneumonia due to Klebsiella pneumoniae: Secondary | ICD-10-CM | POA: Diagnosis not present

## 2015-12-30 DIAGNOSIS — J150 Pneumonia due to Klebsiella pneumoniae: Secondary | ICD-10-CM | POA: Diagnosis not present

## 2015-12-30 DIAGNOSIS — N186 End stage renal disease: Secondary | ICD-10-CM | POA: Diagnosis not present

## 2015-12-30 DIAGNOSIS — J156 Pneumonia due to other aerobic Gram-negative bacteria: Secondary | ICD-10-CM | POA: Diagnosis not present

## 2016-01-01 DIAGNOSIS — N186 End stage renal disease: Secondary | ICD-10-CM | POA: Diagnosis not present

## 2016-01-01 DIAGNOSIS — J150 Pneumonia due to Klebsiella pneumoniae: Secondary | ICD-10-CM | POA: Diagnosis not present

## 2016-01-01 DIAGNOSIS — J156 Pneumonia due to other aerobic Gram-negative bacteria: Secondary | ICD-10-CM | POA: Diagnosis not present

## 2016-01-02 DIAGNOSIS — J449 Chronic obstructive pulmonary disease, unspecified: Secondary | ICD-10-CM | POA: Diagnosis not present

## 2016-01-03 DIAGNOSIS — J150 Pneumonia due to Klebsiella pneumoniae: Secondary | ICD-10-CM | POA: Diagnosis not present

## 2016-01-03 DIAGNOSIS — N186 End stage renal disease: Secondary | ICD-10-CM | POA: Diagnosis not present

## 2016-01-03 DIAGNOSIS — J156 Pneumonia due to other aerobic Gram-negative bacteria: Secondary | ICD-10-CM | POA: Diagnosis not present

## 2016-01-06 DIAGNOSIS — J156 Pneumonia due to other aerobic Gram-negative bacteria: Secondary | ICD-10-CM | POA: Diagnosis not present

## 2016-01-06 DIAGNOSIS — J150 Pneumonia due to Klebsiella pneumoniae: Secondary | ICD-10-CM | POA: Diagnosis not present

## 2016-01-06 DIAGNOSIS — N186 End stage renal disease: Secondary | ICD-10-CM | POA: Diagnosis not present

## 2016-01-07 ENCOUNTER — Encounter: Payer: Self-pay | Admitting: Internal Medicine

## 2016-01-07 ENCOUNTER — Ambulatory Visit (INDEPENDENT_AMBULATORY_CARE_PROVIDER_SITE_OTHER): Payer: Medicare PPO | Admitting: Internal Medicine

## 2016-01-07 VITALS — BP 126/74 | HR 108 | Ht 67.0 in | Wt 159.0 lb

## 2016-01-07 DIAGNOSIS — H04123 Dry eye syndrome of bilateral lacrimal glands: Secondary | ICD-10-CM | POA: Diagnosis not present

## 2016-01-07 DIAGNOSIS — J449 Chronic obstructive pulmonary disease, unspecified: Secondary | ICD-10-CM

## 2016-01-07 DIAGNOSIS — H25813 Combined forms of age-related cataract, bilateral: Secondary | ICD-10-CM | POA: Diagnosis not present

## 2016-01-07 DIAGNOSIS — J9611 Chronic respiratory failure with hypoxia: Secondary | ICD-10-CM

## 2016-01-07 DIAGNOSIS — H25811 Combined forms of age-related cataract, right eye: Secondary | ICD-10-CM | POA: Diagnosis not present

## 2016-01-07 DIAGNOSIS — H25812 Combined forms of age-related cataract, left eye: Secondary | ICD-10-CM | POA: Diagnosis not present

## 2016-01-07 MED ORDER — TIOTROPIUM BROMIDE MONOHYDRATE 2.5 MCG/ACT IN AERS: INHALATION_SPRAY | RESPIRATORY_TRACT | Status: DC

## 2016-01-07 NOTE — Patient Instructions (Signed)
Plan A = Automatic = symbicort 160 Take 2 puffs first thing in am and then another 2 puffs about 12 hours later and spiriva 1.25 x 4 or 2.5 x 2puffs in am only     Plan B = Backup Only use your albuterol (ventolin) as a rescue medication to be used if you can't catch your breath by resting or doing a relaxed purse lip breathing pattern.  - The less you use it, the better it will work when you need it. - Ok to use the inhaler up to 2 puffs  every 4 hours if you must but call for appointment if use goes up over your usual need - Don't leave home without it !!  (think of it like the spare tire for your car)   Plan C = Crisis - only use your albuterol nebulizer if you first try Plan B and it fails to help > ok to use the nebulizer up to every 4 hours but if start needing it regularly call for immediate appointment  Prednisone 5 mg daily     Please schedule a follow up visit in 3 months but call sooner if needed

## 2016-01-07 NOTE — Progress Notes (Signed)
Subjective:    Patient ID: Terry [name], male    DOB: [birth year]    MRN: [id number]   Brief patient profile:  64 yowm quit smoking 2001 bothered by doe seemed better then worse in 2010 p "lung infection" and stayed on advair/spiriva  Combo since 2012 seemed better then admitted in [facility] with pna 10/09/12  in then transferred to [facility] x 3 weeks and discharged  On May 1st  2014 referred to pulmonary clinic 11/29/12  by [facility] for copd eval and proved to have GOLD III copd 01/26/13 with symptoms much better off ACEi.    History of Present Illness  11/29/2012 1st pulmonary ov/ Wert/ on ACEi/ cc doe x maybe a block of walking but not back to baseline and able to do 5 steps and has proaire but not needing. Also persistent dry daytime cough x months >d/c ACE    Admission date: 04/12/2014 Discharge Date: 04/20/2014    Admission Diagnosis Chronic kidney disease, stage III (moderate) [N18.3] Hyperkalemia [E87.5] Left flank pain [R10.9] Cyst of left kidney [Q61.00] COPD mixed type [J44.9] Essential hypertension [I10] Abdominal pain [R10.9]   Discharge Diagnosis Chronic kidney disease, stage III (moderate) [N18.3] Hyperkalemia [E87.5] Left flank pain [R10.9] Cyst of left kidney [Q61.00] COPD mixed type [J44.9] Essential hypertension [I10] Abdominal pain [R10.9]   Principal Problem:  Left flank pain  COPD GOLD III  Chronic kidney disease, stage III (moderate)  CAD (coronary artery disease) of artery bypass graft  HTN (hypertension)  Hyperkalemia   Hospital Course  1. Left flank pain - due to hemorrhage and a renal cyst repeat CT confirms, seen by urologist D/W Dr Alinda Money on 04-14-14, likely cystoscopy outpatient, will continue to follow closely his symptoms and renal function. CT showed some perinephric stranding on the left side, had ++ leukocytosis resolved on empiric IV Rocephin will switch to 4 more days of PO Ceftin on 04-20-14, follow with PCP, renal and  urology. 2. Acute renal failure on top of chronic disease stage IV. Baseline creatinine around 2. Also had some bladder outlet obstruction. Foley was placed, he was hydrated, ACE inhibitor and Lasix held. Renal function has steadily improved. Appreciate nephrology input. We will follow post discharge with nephrology and PCP. 3. Hyponatremia - Per renal. Improving with self diuresis. Repeat BMP in 3-4 days. 4. Hypertension. Stable on beta blocker and Norvasc continue. ACE and Lasix discontinued due to acute renal failure. 5. CAD. No acute issue. Continue beta blocker, hold aspirin for now.  6.Constipation. Placed on bowel regimen and monitor.  7. Shortness of breath. Likely due to combination of mild fluid overload and questionable COPD exacerbation, much improved. The fluids were held and he is being self diuresing, also after a trial of IV steroids. Still requiring 2 L of nasal cannula oxygen down to 5. Much improved after pulmonary toiletry, he be discharged with nebulizer treatments on a steroid taper and home oxygen. Steadily improving with ongoing self diuresis   05/28/2014 transition of care/ post hosp f/u ov/Wert re:  Chief Complaint  Patient presents with  . HFU    Breathing has improved some, but not back to his normal  baseline. He gets SOB walking minimal distances.    still on advair/ saba twice daily helps some - no symptoms at rest or sleeping  rec Try symbicort 160  Take 2 puffs first thing in am and then another 2 puffs about 12 hours later.  Only use your albuterol as needed   08/20/2014  NP  Follow up COPD  Patient returns for a 6 week follow-up. Patient reports that he has good days and bad. Patient was previously on [facility] after his last hospitalization and would like to try this inhaler instead of Advair He feels that he had improved control of his breathing on this He denies any chest pain, orthopnea, PND, increased leg swelling or hemoptysis. Chest x-ray last visit with  COPD changes. No acute process noted. rec May try Dulera 200 2 puffs Twice daily  , rinse after use.  Continue on Spiriva daily .       02/26/2015 f/u ov/Wert re:  GOLD III/ no 02 on symb/ spiriva dpi  Chief Complaint  Patient presents with  . Follow-up    Pt states that his breathing is unchanged. He is having increased cough x 3 wks- prod with white sputum.   No need for rescue rx Doe x MMRC1 = can walk nl pace, flat grade, can't hurry or go uphills or steps s sob  s 02  rec  No change in medications - your weight and kidney function are clearly an issue and I will let Dr Joelyn Oms know about the HCO3 issue Let the symbicort and spiriva go out thru your nose  Only use your albuterol (ventolin) as a rescue medication   Please schedule a follow up visit in 3 months but call sooner if needed bring all inhalers with you and fill the spiriva respimat before the visit - late add: ? Bisoprolol instead of lopressor   Admit date: 08/14/2015 Discharge date: 08/19/2015 Discharge Diagnoses:  Active Problems:  CAD (coronary artery disease) of artery bypass graft  COPD exacerbation (HCC)  Acute respiratory failure with hypoxia (HCC)  Chronic atrial fibrillation (HCC)  Chronic diastolic heart failure (HCC)  Anemia of chronic disease  ESRD (end stage renal disease) on dialysis ([place])   08/27/2015  Extended post hosp /transition of care  f/u ov/Wert re: recurrent admits/ now chronic resp failure/HD dep CRI plus GOLD III rx symb/spriiva/neb saba Chief Complaint  Patient presents with  . Follow-up    Pt c/o occasional cough with white mucus. Pt denies wheeze/increased SOB/CP/tightness. Pt is on O2/2L, he states that on 3L it dries out his nose and burns. Pt also c/o low BP readings and wonders if some of his HTN meds need to be changed.   now sleeping in a recliner since admit 30-45 degrees due to sob/some noct cough but excess/ purulent sputum or mucus plugs   On prednisone with floor of   @  10 mg since d/c 08/19/15  rec May need to consider adding Oracit to your regimen and I will le Dr Lorrene Reid know You will need to adjust your 02 to saturations of over 90%  Best fit and humidified 02 ordered  Plan A = Automatic = Symbicort 160 2 in am and 2 in pm with spiriva each am and prednisone 10 mg with breakfast daily  Work on inhaler technique:  Plan B = Backup Only use your albuterol (proventil) as a rescue medication Plan C = Crisis - only use your albuterol nebulizer if you first try Plan B and it fails to help > ok to use the nebulizer up to every 4 hours but if start needing it regularly call for immediate appointment   09/26/2015  f/u ov/Wert re: COPD III on symbicort 160/spiriva and 02 2lpm 24/7  Chief Complaint  Patient presents with  . Follow-up    Cough has improved  some- requesting refill on tessalon.   doe/ weak x room to room  Prednisone 10 mg daily floor for now  No need for saba or neb  At this point  rec Prednisone 10 mg one on even and one-half on odd days x 2 weeks and if not change then reduce to 5 mg daily  Work on maintaining perfect inhaler technique:   Goal for 02 is 90% or above / ok adjust up  Or down or off as along as attaining this goal     11/26/2015  f/u ov/Wert re: copd gold III/ symb/spiriva/ 02 1lpm hs and prn daytime  / still on pred 10 mg daily (maint rx since 08/19/2015 ) Chief Complaint  Patient presents with  . Follow-up    Breathing is doing well. He has not been using rescue inhaler or neb. He has not used o2 much during the day.  never tapered pred as rec "I was afraid to" rec Try prednisone 10 mg one half daily  Plan A = Automatic = symbicort and spiriva  Plan B = Backup Only use your albuterol (ventollin) as a rescue medication Plan C = Crisis - only use your albuterol nebulizer if you first try Plan B     01/07/2016  f/u ov/Wert re:  Copd GOLD III/ rx 5 prednisone / symb/spiriva and rare saba  (maybe once a week)  02 hs only   Chief Complaint  Patient presents with  . Follow-up    pt states breathing is baseline since last OV. c/o sob & non prod cough.  02 1lpm hs rarely during the day / c/o dry mouth ? From spiriva dpi Doe = MMRC3 = can't walk 100 yards even at a slow pace at a flat grade s stopping due to sob      No obvious day to day or daytime variabilty or assoc excess/ purulent sputum or mucus plugs    cp or chest tightness, subjective wheeze overt sinus or hb symptoms. No unusual exp hx or h/o childhood pna/ asthma or knowledge of premature birth.   Sleeping ok without nocturnal  or early am exacerbation  of respiratory  c/o's or need for noct saba. Also denies any obvious fluctuation of symptoms with weather or environmental changes or other aggravating or alleviating factors except as outlined above   Current Medications, Allergies, Complete Past Medical History, Past Surgical History, Family History, and Social History were reviewed in [employer] record.  ROS  The following are not active complaints unless bolded sore throat, dysphagia, dental problems, itching, sneezing,  nasal congestion or excess/ purulent secretions, ear ache,   fever, chills, sweats, unintended wt loss, pleuritic or exertional cp, hemoptysis,  orthopnea pnd or leg swelling, presyncope, palpitations, heartburn, abdominal pain, anorexia, nausea, vomiting, diarrhea  or change in bowel or urinary habits, change in stools or urine, dysuria,hematuria,  rash, arthralgias, visual complaints, headache, numbness weakness improving or ataxia or problems with walking or coordination,  change in mood/affect or memory.           Objective:   Physical Exam  Somber amb wm nad no longer using walker  02/26/2015          210 >  08/27/2015 163 p HD > 09/26/2015  159 > 11/26/2015  160 > 01/07/2016  159     10/22/14 202 lb (91.627 kg)  08/28/14 203 lb 8 oz (92.307 kg)  08/20/14 205 lb 6.4 oz (93.169 kg)    Vital  signs reviewed  -  note sats 91% at rest on RA  HEENT: nl dentition, turbinates, and oropharynx. Nl external ear canals without cough reflex   NECK :  without JVD/Nodes/TM/ nl carotid upstrokes bilaterally   LUNGS: no acc muscle use,  Barrel chest, moderate, with increased Texp but min exp rhonchi  CV:  RRR  no s3 or murmur or increase in P2, no edema   ABD:  soft and nontender with nl inspiratory excursion in the supine position. No bruits or organomegaly, bowel sounds nl  MS:  Nl gait/ ext warm without deformities, calf tenderness, cyanosis or clubbing No obvious joint restrictions   SKIN: warm and dry without lesions    NEURO:  alert, approp, nl sensorium with  no motor deficits       I personally reviewed images and agree with radiology impression as follows:  CXR:  08/14/15 Stable exam. Emphysema with cardiomegaly and bibasilar atelectasis.       Assessment & Plan:

## 2016-01-08 DIAGNOSIS — J156 Pneumonia due to other aerobic Gram-negative bacteria: Secondary | ICD-10-CM | POA: Diagnosis not present

## 2016-01-08 DIAGNOSIS — N186 End stage renal disease: Secondary | ICD-10-CM | POA: Diagnosis not present

## 2016-01-08 DIAGNOSIS — J150 Pneumonia due to Klebsiella pneumoniae: Secondary | ICD-10-CM | POA: Diagnosis not present

## 2016-01-08 NOTE — Assessment & Plan Note (Signed)
02 rx 2.5  lpm 24/7 as of d/c 08/14/15   As of 01/07/2016 = 1lpm sleeping, prn daytime with goal of sat > 90% self titrating   Adequate control on present rx, reviewed > no change in rx needed

## 2016-01-08 NOTE — Assessment & Plan Note (Signed)
-   Trial off ACEi 11/30/2012 >>> much better symptom control - 05/28/2014   Walked RA x one lap @ 185 stopped due to  [employer] / sats ok/ mod pace - PFT's 01/26/13      FEV1  1.28 (48%) ratio 48 and no better p B2,  DLCO 39 corrects to 48%  - PFTs 02/26/2015   FEV1 0.99  (35 % ) ratio 35  p 11 % improvement from saba with DLCO  24 % corrects to 35  % for alv volume  p am symb/spiriva    - Pred maint rx with floor of 10 mg per day at d/c 08/14/15  - 09/26/2015  extensive coaching HFA effectiveness =    75%  - 11/26/2015 rec reduce pred to 5 mg daily   - 01/07/2016  After extensive coaching HFA effectiveness =  90% with respimat so changed spiriva to respimat  And maintain pred 5 mg daily   The goal with a chronic steroid dependent illness is always arriving at the lowest effective dose that controls the disease/symptoms and not accepting a set "formula" which is based on statistics or guidelines that don't always take into account patient  variability or the natural hx of the dz in every individual patient, which may well vary over time.  For now therefore I recommend the patient maintain  A floor of prednisone 5 mg daily   I had an extended discussion with the patient reviewing all relevant studies completed to date and  lasting 15 to 20 minutes of a 25 minute visit    Each maintenance medication was reviewed in detail including most importantly the difference between maintenance and prns and under what circumstances the prns are to be triggered using an action plan format that is not reflected in the computer generated alphabetically organized AVS.    Please see instructions for details which were reviewed in writing and the patient given a copy highlighting the part that I personally wrote and discussed at today's ov.

## 2016-01-10 DIAGNOSIS — J156 Pneumonia due to other aerobic Gram-negative bacteria: Secondary | ICD-10-CM | POA: Diagnosis not present

## 2016-01-10 DIAGNOSIS — N186 End stage renal disease: Secondary | ICD-10-CM | POA: Diagnosis not present

## 2016-01-10 DIAGNOSIS — J150 Pneumonia due to Klebsiella pneumoniae: Secondary | ICD-10-CM | POA: Diagnosis not present

## 2016-01-13 DIAGNOSIS — N186 End stage renal disease: Secondary | ICD-10-CM | POA: Diagnosis not present

## 2016-01-13 DIAGNOSIS — J150 Pneumonia due to Klebsiella pneumoniae: Secondary | ICD-10-CM | POA: Diagnosis not present

## 2016-01-13 DIAGNOSIS — J156 Pneumonia due to other aerobic Gram-negative bacteria: Secondary | ICD-10-CM | POA: Diagnosis not present

## 2016-01-15 DIAGNOSIS — J449 Chronic obstructive pulmonary disease, unspecified: Secondary | ICD-10-CM | POA: Diagnosis not present

## 2016-01-15 DIAGNOSIS — N186 End stage renal disease: Secondary | ICD-10-CM | POA: Diagnosis not present

## 2016-01-15 DIAGNOSIS — I2510 Atherosclerotic heart disease of native coronary artery without angina pectoris: Secondary | ICD-10-CM | POA: Diagnosis not present

## 2016-01-15 DIAGNOSIS — L0390 Cellulitis, unspecified: Secondary | ICD-10-CM | POA: Diagnosis not present

## 2016-01-15 DIAGNOSIS — I5031 Acute diastolic (congestive) heart failure: Secondary | ICD-10-CM | POA: Diagnosis not present

## 2016-01-15 DIAGNOSIS — K922 Gastrointestinal hemorrhage, unspecified: Secondary | ICD-10-CM | POA: Diagnosis not present

## 2016-01-15 DIAGNOSIS — Z992 Dependence on renal dialysis: Secondary | ICD-10-CM | POA: Diagnosis not present

## 2016-01-15 DIAGNOSIS — D500 Iron deficiency anemia secondary to blood loss (chronic): Secondary | ICD-10-CM | POA: Diagnosis not present

## 2016-01-15 DIAGNOSIS — M6281 Muscle weakness (generalized): Secondary | ICD-10-CM | POA: Diagnosis not present

## 2016-01-15 DIAGNOSIS — J150 Pneumonia due to Klebsiella pneumoniae: Secondary | ICD-10-CM | POA: Diagnosis not present

## 2016-01-15 DIAGNOSIS — R2681 Unsteadiness on feet: Secondary | ICD-10-CM | POA: Diagnosis not present

## 2016-01-15 DIAGNOSIS — J156 Pneumonia due to other aerobic Gram-negative bacteria: Secondary | ICD-10-CM | POA: Diagnosis not present

## 2016-01-16 DIAGNOSIS — H268 Other specified cataract: Secondary | ICD-10-CM | POA: Diagnosis not present

## 2016-01-16 DIAGNOSIS — H52221 Regular astigmatism, right eye: Secondary | ICD-10-CM | POA: Diagnosis not present

## 2016-01-16 DIAGNOSIS — H2511 Age-related nuclear cataract, right eye: Secondary | ICD-10-CM | POA: Diagnosis not present

## 2016-01-16 DIAGNOSIS — H25811 Combined forms of age-related cataract, right eye: Secondary | ICD-10-CM | POA: Diagnosis not present

## 2016-01-17 DIAGNOSIS — J150 Pneumonia due to Klebsiella pneumoniae: Secondary | ICD-10-CM | POA: Diagnosis not present

## 2016-01-17 DIAGNOSIS — N186 End stage renal disease: Secondary | ICD-10-CM | POA: Diagnosis not present

## 2016-01-17 DIAGNOSIS — J156 Pneumonia due to other aerobic Gram-negative bacteria: Secondary | ICD-10-CM | POA: Diagnosis not present

## 2016-01-20 ENCOUNTER — Telehealth: Payer: Self-pay | Admitting: *Deleted

## 2016-01-20 DIAGNOSIS — N186 End stage renal disease: Secondary | ICD-10-CM | POA: Diagnosis not present

## 2016-01-20 DIAGNOSIS — Z992 Dependence on renal dialysis: Secondary | ICD-10-CM | POA: Diagnosis not present

## 2016-01-20 DIAGNOSIS — J156 Pneumonia due to other aerobic Gram-negative bacteria: Secondary | ICD-10-CM | POA: Diagnosis not present

## 2016-01-20 DIAGNOSIS — J150 Pneumonia due to Klebsiella pneumoniae: Secondary | ICD-10-CM | POA: Diagnosis not present

## 2016-01-20 DIAGNOSIS — E1122 Type 2 diabetes mellitus with diabetic chronic kidney disease: Secondary | ICD-10-CM | POA: Diagnosis not present

## 2016-01-20 NOTE — Telephone Encounter (Signed)
Pt is due for 3-4 month follow up with Dr. [name]. I spoke with him and offered appt for 01/23/16. Pt asked if there was an opening next week.  I told him Dr. [name] was not in office next week.  I offered appt with PA or NP but pt would like to see Dr. [name].  Pt is unable to schedule at this time and states he will call back to schedule appt.

## 2016-01-22 DIAGNOSIS — N186 End stage renal disease: Secondary | ICD-10-CM | POA: Diagnosis not present

## 2016-01-22 DIAGNOSIS — J150 Pneumonia due to Klebsiella pneumoniae: Secondary | ICD-10-CM | POA: Diagnosis not present

## 2016-01-22 DIAGNOSIS — J156 Pneumonia due to other aerobic Gram-negative bacteria: Secondary | ICD-10-CM | POA: Diagnosis not present

## 2016-01-23 ENCOUNTER — Ambulatory Visit (INDEPENDENT_AMBULATORY_CARE_PROVIDER_SITE_OTHER): Payer: Medicare PPO | Admitting: Cardiovascular Disease

## 2016-01-23 ENCOUNTER — Other Ambulatory Visit: Payer: Self-pay | Admitting: *Deleted

## 2016-01-23 ENCOUNTER — Encounter: Payer: Self-pay | Admitting: Cardiovascular Disease

## 2016-01-23 VITALS — BP 102/50 | HR 107 | Ht 68.0 in | Wt 160.4 lb

## 2016-01-23 DIAGNOSIS — I10 Essential (primary) hypertension: Secondary | ICD-10-CM

## 2016-01-23 DIAGNOSIS — I2510 Atherosclerotic heart disease of native coronary artery without angina pectoris: Secondary | ICD-10-CM

## 2016-01-23 DIAGNOSIS — E785 Hyperlipidemia, unspecified: Secondary | ICD-10-CM

## 2016-01-23 DIAGNOSIS — I481 Persistent atrial fibrillation: Secondary | ICD-10-CM | POA: Diagnosis not present

## 2016-01-23 DIAGNOSIS — I4819 Other persistent atrial fibrillation: Secondary | ICD-10-CM

## 2016-01-23 MED ORDER — AMIODARONE HCL 200 MG PO TABS: 200.0000 mg | ORAL_TABLET | Freq: Every day | ORAL | 11 refills | Status: DC

## 2016-01-23 NOTE — Patient Instructions (Signed)

## 2016-01-23 NOTE — Progress Notes (Signed)
Chief Complaint  Patient presents with  . Follow-up     History of Present Illness: 81 yo male with history of HTN, HLD, CVA, COPD, CAD, ESRD on HD here today for cardiac follow up. He had been followed by cardiology in [place], [name] for many years until he moved to [place] in 2014. Cath records received from [facility] . Cardiac cath 11/30/1997. Mild distal Left main stenosis, 25% proximal LAD, minor irregularities Circumflex, 95% mid RCA stenosis treated with 3.5 x 32 mm bare metal stent. There was a ? Overlapping 3.5 x 18 mm bare metal stent more proximally. Echo 02/03/13 with LVEF=50-55%. He had normal carotid dopplers in [place] 2013. He was admitted to [facility] October 2015 with ruptured kidney cyst. Atrial fibrillation diagnosed in December 2015 at an office visit. He was started on Eliquis at that time. He has been followed in Nephrology for his kidney disease and is now ESRD on HD. Low risk stress myoview September 2016. He had no chest pain so cath not planned at that time. His Eliquis was stopped due to HD and he was tried on coumadin for his atrial fibrillation but had GI blood loss so coumadin was stopped. He has not been on coumadin or ASA. Lopressor stopped due to hypotension and dizziness on HD.   He is here today for follow up. No chest pain. He is feeling well overall. He is bruising easily on ASA. No dizziness, near syncope or syncope. No LE edema.   Primary Care Physician: [name], DO   Past Medical History:  Diagnosis Date  . Acute and chronic respiratory failure 10/22/2012  . Atrial fibrillation ([place])   . CAD (coronary artery disease) [place], [place]  . COPD (chronic obstructive pulmonary disease) ([place])   . High cholesterol   . Hypertension   . Kidney disease    CKD4, sees [facility], considering dialysis  . Osteoarthritis 10/22/2012  . Pneumonia April 2014  . Shortness of breath dyspnea   . Stroke [facility]) 2012    Past Surgical History:    Procedure Laterality Date  . APPENDECTOMY  1984  . AV FISTULA PLACEMENT Right 05/21/2015   Procedure: Right Arm Brachiocephalic ARTERIOVENOUS (AV) FISTULA CREATION;  Surgeon: Angelia Mould, MD;  Location: [facility];  Service: Vascular;  Laterality: Right;  . COLON SURGERY  2012   partial colon removed - twisted bowel  . CORONARY STENT PLACEMENT  1999   2 stents  . CYSTOSCOPY/RETROGRADE/URETEROSCOPY Bilateral 04/23/2014   Procedure: CYSTOSCOPY BILATERAL RETROGRADE,  LEFT URETEROSCOPY, LEFT STENT PLACEMENT;  Surgeon: Raynelle Bring, MD;  Location: [facility];  Service: Urology;  Laterality: Bilateral;  . HERNIA REPAIR     Right inguinal    Current Outpatient Prescriptions  Medication Sig Dispense Refill  . albuterol (VENTOLIN HFA) 108 (90 Base) MCG/ACT inhaler Inhale 2 puffs into the lungs every 4 (four) hours as needed for wheezing or shortness of breath. 1 Inhaler 11  . aspirin EC 325 MG tablet Take 1 tablet (325 mg total) by mouth daily. 30 tablet 0  . atorvastatin (LIPITOR) 20 MG tablet Take 1 tablet (20 mg total) by mouth daily. 30 tablet 6  . benzonatate (TESSALON) 200 MG capsule Take 1 capsule by mouth 4 (four) times daily as needed for cough.    . budesonide-formoterol (SYMBICORT) 160-4.5 MCG/ACT inhaler Inhale 2 puffs into the lungs 2 (two) times daily. 3 Inhaler 3  . calcium acetate (PHOSLO) 667 MG capsule Take 1 to  2 capsules by mouth with each meal or snack.    . cholecalciferol (VITAMIN D) 1000 units tablet Take 1,000 Units by mouth daily.    [name] ethyl chloride spray Use as directed on dialysis days.    [name] guaiFENesin (MUCINEX) 600 MG 12 hr tablet Take 600 mg by mouth 2 (two) times daily.    [name] ketorolac (ACULAR) 0.4 % SOLN Place 1 drop into the right eye 4 (four) times daily as needed.    . methocarbamol (ROBAXIN) 500 MG tablet Take 1 tablet (500 mg total) by mouth every 8 (eight) hours as needed for muscle spasms. 30 tablet 0  . ofloxacin (OCUFLOX) 0.3 % ophthalmic solution Place  1 drop into the right eye 4 (four) times daily.    . OXYGEN 2lpm with sleep and as needed  [facility]    . polyethylene glycol (MIRALAX) packet Take 17 g by mouth daily as needed for moderate constipation. 30 each 0  . prednisoLONE acetate (PRED FORTE) 1 % ophthalmic suspension Place 1 drop into the right eye 4 (four) times daily.    . predniSONE (DELTASONE) 10 MG tablet 10 alternating with 5 mg daily    . senna-docusate (SENOKOT S) 8.6-50 MG tablet Take 2 tablets by mouth at bedtime as needed for mild constipation. 60 tablet 3  . SPIRIVA HANDIHALER 18 MCG inhalation capsule Place 2 puffs into inhaler and inhale daily.     No current facility-administered medications for this visit.     Allergies  Allergen Reactions  . Yellow Dyes (Non-Tartrazine) Other (See Comments)    Burns arms Cough medications (tussionex and benzonatate ok)  . Levaquin [Levofloxacin In D5w] Itching  . Tussionex Pennkinetic Er [Hydrocod Polst-Cpm Polst Er] Other (See Comments)    Unknown on MAR    Social History   Social History  . Marital status: Married    Spouse name: N/A  . Number of children: 3  . Years of education: N/A   Occupational History  . retired-worked in radio Retired   Social History Main Topics  . Smoking status: Former Smoker    Packs/day: 1.00    Years: 49.00    Types: Cigarettes, Pipe, Cigars    Quit date: 02/21/2000  . Smokeless tobacco: Never Used  . Alcohol use No  . Drug use: No  . Sexual activity: Not on file   Other Topics Concern  . Not on file   Social History Narrative   Work or School: retired [employer] [place] Situation: lives with wife in [place] Beliefs: Binger regular; poor diet             Family History  Problem Relation Age of Onset  . Cancer Mother     Breast  . Heart disease Father   . Heart attack Father   . Parkinson's disease Brother     Review of Systems:  As stated in the HPI and otherwise  negative.   BP (!) 102/50   Pulse (!) 107   Ht 5\' 8"  (1.727 m)   Wt 160 lb 6.4 oz (72.8 kg)   BMI 24.39 kg/m   Physical Examination: General: Well developed, well nourished, NAD  HEENT: OP clear, mucus membranes moist  SKIN: warm, dry. No rashes. Neuro: No focal deficits  Musculoskeletal: Muscle strength 5/5 all ext  Psychiatric: Mood and affect normal  Neck: No JVD, no carotid bruits, no thyromegaly,  no lymphadenopathy.  Lungs:Clear bilaterally, no wheezes, rhonci, crackles Cardiovascular: Irreg irreg rate and rhythm. No murmurs, gallops or rubs. Abdomen:Soft. Bowel sounds present. Non-tender.  Extremities: Trace bilateral lower extremity edema. Pulses are 2 + in the bilateral DP/PT.  Echo 02/03/13: Left ventricle: The cavity size was normal. Wall thickness was normal. Systolic function was normal. The estimated ejection fraction was in the range of 50% to 55%. Doppler parameters are consistent with abnormal left ventricular relaxation (grade 1 diastolic dysfunction).  EKG:  EKG is not ordered today. The ekg ordered today demonstrates   Recent Labs: 06/26/2015: Magnesium 1.9 07/28/2015: B Natriuretic Peptide 432.6 08/15/2015: ALT 14; TSH 2.383 08/19/2015: BUN 100; Creatinine, Ser 6.32; Hemoglobin 11.1; Platelets 298; Potassium 4.3; Sodium 130   Lipid Panel    Component Value Date/Time   CHOL 183 03/20/2015 1001   TRIG 91 06/08/2015 0016   HDL 34.40 (L) 03/20/2015 1001   CHOLHDL 5 03/20/2015 1001   VLDL 38.8 03/20/2015 1001   LDLCALC 109 (H) 03/20/2015 1001   LDLDIRECT 93.9 02/03/2013 0933     Wt Readings from Last 3 Encounters:  01/23/16 160 lb 6.4 oz (72.8 kg)  01/07/16 159 lb (72.1 kg)  11/26/15 161 lb 6.4 oz (73.2 kg)     Other studies Reviewed: Additional studies/ records that were reviewed today include: . Review of the above records demonstrates:    Assessment and Plan:   1. CAD: He has had no recent chest pains. Continue ASA and statin. He did not  tolerate beta blocker due to hypotension.    2. HTN: BP well controlled. No changes.   3. Hyperlipidemia: Continue statin.   4. Atrial fib, persistent: He is in atrial fib today. He did not tolerate coumadin due to GI bleeding. He cannot take a NOAC with HD.  CHADS VASC Score of 6 so long term anti-coagulation would be favorable but at this time I do not think it is safe. He is even having severe bruising on ASA. Beta blocker stopped due to hypotension. Will start amiodarone 200 mg daily for rate control.  Current medicines are reviewed at length with the patient today.  The patient does not have concerns regarding medicines.  The following changes have been made:  no change  Labs/ tests ordered today include:   No orders of the defined types were placed in this encounter.   Disposition:   FU with me in 6 months  Signed, Lauree Chandler, MD 01/23/2016 3:21 PM    [facility] [address], [place], [place]  [address] Phone: [phone]; Fax: [phone]

## 2016-01-24 DIAGNOSIS — N186 End stage renal disease: Secondary | ICD-10-CM | POA: Diagnosis not present

## 2016-01-24 DIAGNOSIS — J150 Pneumonia due to Klebsiella pneumoniae: Secondary | ICD-10-CM | POA: Diagnosis not present

## 2016-01-24 DIAGNOSIS — J156 Pneumonia due to other aerobic Gram-negative bacteria: Secondary | ICD-10-CM | POA: Diagnosis not present

## 2016-01-27 ENCOUNTER — Telehealth: Payer: Self-pay | Admitting: Cardiovascular Disease

## 2016-01-27 DIAGNOSIS — J150 Pneumonia due to Klebsiella pneumoniae: Secondary | ICD-10-CM | POA: Diagnosis not present

## 2016-01-27 DIAGNOSIS — N186 End stage renal disease: Secondary | ICD-10-CM | POA: Diagnosis not present

## 2016-01-27 DIAGNOSIS — I4891 Unspecified atrial fibrillation: Secondary | ICD-10-CM

## 2016-01-27 DIAGNOSIS — J156 Pneumonia due to other aerobic Gram-negative bacteria: Secondary | ICD-10-CM | POA: Diagnosis not present

## 2016-01-27 DIAGNOSIS — Z79899 Other long term (current) drug therapy: Secondary | ICD-10-CM

## 2016-01-27 DIAGNOSIS — I5032 Chronic diastolic (congestive) heart failure: Secondary | ICD-10-CM

## 2016-01-27 NOTE — Telephone Encounter (Signed)
Spoke with pt. He is concerned about starting amiodarone after reading side effects. He read side effects may include lung,thyroid and liver problems. Also concerned about possibility of medication lowering his BP.  Will forward to Dr. [name] for review/recommendations.

## 2016-01-27 NOTE — Telephone Encounter (Signed)
New message      Pt c/o medication issue:  1. Name of Medication: amiodarone  2. How are you currently taking this medication (dosage and times per day)? 200 mg 3. Are you having a reaction (difficulty breathing--STAT)? no  4. What is your medication issue? Pt concerned about possible side effects about starting this medication.  Please call before 10:30

## 2016-01-27 NOTE — Telephone Encounter (Signed)
We can let him know that this medication could have these side effects but we will monitor closely to make sure he does not. If he is willing to start, would arrange LFTs, TSH in 3 months. He will need yearly chest x-rays. It should not lower his BP. chris

## 2016-01-27 NOTE — Telephone Encounter (Signed)
I spoke with pt and gave him information from Dr. [name].  He will come in for lab work on 04/28/16.  Chest X-ray to be arranged at next office visit with Dr. [name].

## 2016-01-28 DIAGNOSIS — I4891 Unspecified atrial fibrillation: Secondary | ICD-10-CM | POA: Diagnosis not present

## 2016-01-28 DIAGNOSIS — J449 Chronic obstructive pulmonary disease, unspecified: Secondary | ICD-10-CM | POA: Diagnosis not present

## 2016-01-28 DIAGNOSIS — M1711 Unilateral primary osteoarthritis, right knee: Secondary | ICD-10-CM | POA: Diagnosis not present

## 2016-01-28 DIAGNOSIS — R252 Cramp and spasm: Secondary | ICD-10-CM | POA: Diagnosis not present

## 2016-01-28 DIAGNOSIS — Z6824 Body mass index (BMI) 24.0-24.9, adult: Secondary | ICD-10-CM | POA: Diagnosis not present

## 2016-01-28 DIAGNOSIS — I120 Hypertensive chronic kidney disease with stage 5 chronic kidney disease or end stage renal disease: Secondary | ICD-10-CM | POA: Diagnosis not present

## 2016-01-28 DIAGNOSIS — E785 Hyperlipidemia, unspecified: Secondary | ICD-10-CM | POA: Diagnosis not present

## 2016-01-28 DIAGNOSIS — N186 End stage renal disease: Secondary | ICD-10-CM | POA: Diagnosis not present

## 2016-01-28 DIAGNOSIS — K5900 Constipation, unspecified: Secondary | ICD-10-CM | POA: Diagnosis not present

## 2016-01-29 DIAGNOSIS — J156 Pneumonia due to other aerobic Gram-negative bacteria: Secondary | ICD-10-CM | POA: Diagnosis not present

## 2016-01-29 DIAGNOSIS — J150 Pneumonia due to Klebsiella pneumoniae: Secondary | ICD-10-CM | POA: Diagnosis not present

## 2016-01-29 DIAGNOSIS — N186 End stage renal disease: Secondary | ICD-10-CM | POA: Diagnosis not present

## 2016-01-31 DIAGNOSIS — J156 Pneumonia due to other aerobic Gram-negative bacteria: Secondary | ICD-10-CM | POA: Diagnosis not present

## 2016-01-31 DIAGNOSIS — N186 End stage renal disease: Secondary | ICD-10-CM | POA: Diagnosis not present

## 2016-01-31 DIAGNOSIS — J150 Pneumonia due to Klebsiella pneumoniae: Secondary | ICD-10-CM | POA: Diagnosis not present

## 2016-02-02 DIAGNOSIS — J449 Chronic obstructive pulmonary disease, unspecified: Secondary | ICD-10-CM | POA: Diagnosis not present

## 2016-02-03 DIAGNOSIS — J150 Pneumonia due to Klebsiella pneumoniae: Secondary | ICD-10-CM | POA: Diagnosis not present

## 2016-02-03 DIAGNOSIS — J156 Pneumonia due to other aerobic Gram-negative bacteria: Secondary | ICD-10-CM | POA: Diagnosis not present

## 2016-02-03 DIAGNOSIS — N186 End stage renal disease: Secondary | ICD-10-CM | POA: Diagnosis not present

## 2016-02-05 DIAGNOSIS — J156 Pneumonia due to other aerobic Gram-negative bacteria: Secondary | ICD-10-CM | POA: Diagnosis not present

## 2016-02-05 DIAGNOSIS — J150 Pneumonia due to Klebsiella pneumoniae: Secondary | ICD-10-CM | POA: Diagnosis not present

## 2016-02-05 DIAGNOSIS — N186 End stage renal disease: Secondary | ICD-10-CM | POA: Diagnosis not present

## 2016-02-06 DIAGNOSIS — H2512 Age-related nuclear cataract, left eye: Secondary | ICD-10-CM | POA: Diagnosis not present

## 2016-02-07 DIAGNOSIS — N186 End stage renal disease: Secondary | ICD-10-CM | POA: Diagnosis not present

## 2016-02-07 DIAGNOSIS — J156 Pneumonia due to other aerobic Gram-negative bacteria: Secondary | ICD-10-CM | POA: Diagnosis not present

## 2016-02-07 DIAGNOSIS — J150 Pneumonia due to Klebsiella pneumoniae: Secondary | ICD-10-CM | POA: Diagnosis not present

## 2016-02-10 DIAGNOSIS — J156 Pneumonia due to other aerobic Gram-negative bacteria: Secondary | ICD-10-CM | POA: Diagnosis not present

## 2016-02-10 DIAGNOSIS — J150 Pneumonia due to Klebsiella pneumoniae: Secondary | ICD-10-CM | POA: Diagnosis not present

## 2016-02-10 DIAGNOSIS — N186 End stage renal disease: Secondary | ICD-10-CM | POA: Diagnosis not present

## 2016-02-11 DIAGNOSIS — H25812 Combined forms of age-related cataract, left eye: Secondary | ICD-10-CM | POA: Diagnosis not present

## 2016-02-11 DIAGNOSIS — H52222 Regular astigmatism, left eye: Secondary | ICD-10-CM | POA: Diagnosis not present

## 2016-02-11 DIAGNOSIS — H2512 Age-related nuclear cataract, left eye: Secondary | ICD-10-CM | POA: Diagnosis not present

## 2016-02-12 DIAGNOSIS — J156 Pneumonia due to other aerobic Gram-negative bacteria: Secondary | ICD-10-CM | POA: Diagnosis not present

## 2016-02-12 DIAGNOSIS — J150 Pneumonia due to Klebsiella pneumoniae: Secondary | ICD-10-CM | POA: Diagnosis not present

## 2016-02-12 DIAGNOSIS — N186 End stage renal disease: Secondary | ICD-10-CM | POA: Diagnosis not present

## 2016-02-13 ENCOUNTER — Ambulatory Visit: Payer: Medicare PPO | Admitting: Podiatry

## 2016-02-14 DIAGNOSIS — J156 Pneumonia due to other aerobic Gram-negative bacteria: Secondary | ICD-10-CM | POA: Diagnosis not present

## 2016-02-14 DIAGNOSIS — N186 End stage renal disease: Secondary | ICD-10-CM | POA: Diagnosis not present

## 2016-02-14 DIAGNOSIS — J150 Pneumonia due to Klebsiella pneumoniae: Secondary | ICD-10-CM | POA: Diagnosis not present

## 2016-02-15 DIAGNOSIS — I5031 Acute diastolic (congestive) heart failure: Secondary | ICD-10-CM | POA: Diagnosis not present

## 2016-02-15 DIAGNOSIS — K922 Gastrointestinal hemorrhage, unspecified: Secondary | ICD-10-CM | POA: Diagnosis not present

## 2016-02-15 DIAGNOSIS — Z992 Dependence on renal dialysis: Secondary | ICD-10-CM | POA: Diagnosis not present

## 2016-02-15 DIAGNOSIS — R2681 Unsteadiness on feet: Secondary | ICD-10-CM | POA: Diagnosis not present

## 2016-02-15 DIAGNOSIS — D500 Iron deficiency anemia secondary to blood loss (chronic): Secondary | ICD-10-CM | POA: Diagnosis not present

## 2016-02-15 DIAGNOSIS — M6281 Muscle weakness (generalized): Secondary | ICD-10-CM | POA: Diagnosis not present

## 2016-02-15 DIAGNOSIS — I2510 Atherosclerotic heart disease of native coronary artery without angina pectoris: Secondary | ICD-10-CM | POA: Diagnosis not present

## 2016-02-15 DIAGNOSIS — J449 Chronic obstructive pulmonary disease, unspecified: Secondary | ICD-10-CM | POA: Diagnosis not present

## 2016-02-15 DIAGNOSIS — L0390 Cellulitis, unspecified: Secondary | ICD-10-CM | POA: Diagnosis not present

## 2016-02-17 DIAGNOSIS — N186 End stage renal disease: Secondary | ICD-10-CM | POA: Diagnosis not present

## 2016-02-17 DIAGNOSIS — J156 Pneumonia due to other aerobic Gram-negative bacteria: Secondary | ICD-10-CM | POA: Diagnosis not present

## 2016-02-17 DIAGNOSIS — J150 Pneumonia due to Klebsiella pneumoniae: Secondary | ICD-10-CM | POA: Diagnosis not present

## 2016-02-19 DIAGNOSIS — J150 Pneumonia due to Klebsiella pneumoniae: Secondary | ICD-10-CM | POA: Diagnosis not present

## 2016-02-19 DIAGNOSIS — J156 Pneumonia due to other aerobic Gram-negative bacteria: Secondary | ICD-10-CM | POA: Diagnosis not present

## 2016-02-19 DIAGNOSIS — N186 End stage renal disease: Secondary | ICD-10-CM | POA: Diagnosis not present

## 2016-02-20 ENCOUNTER — Telehealth: Payer: Self-pay | Admitting: Cardiovascular Disease

## 2016-02-20 DIAGNOSIS — E1122 Type 2 diabetes mellitus with diabetic chronic kidney disease: Secondary | ICD-10-CM | POA: Diagnosis not present

## 2016-02-20 DIAGNOSIS — N186 End stage renal disease: Secondary | ICD-10-CM | POA: Diagnosis not present

## 2016-02-20 DIAGNOSIS — Z992 Dependence on renal dialysis: Secondary | ICD-10-CM | POA: Diagnosis not present

## 2016-02-20 NOTE — Telephone Encounter (Signed)
I would not restart Lopressor as he had hypotension and bradycardia on this. If his BP is now running high (had been very low on HD), he will need to discuss with his Nephrologist. I would not start any BP meds at this time. cdm

## 2016-02-20 NOTE — Telephone Encounter (Signed)
I spoke with pt and gave him information from Dr. [name]. I asked him to check with nephrologist when at dialysis tomorrow. He states nephrologist is not always at dialysis. I asked him to have staff at dialysis contact nephrologist to discuss BP.  I also told pt he could contact nephrology office if they were unable to call from dialysis.

## 2016-02-20 NOTE — Telephone Encounter (Signed)
Spoke with pt. He reports readings below were at dialysis yesterday. Heart rate at that time was 60-75. Has not checked BP since. On Monday at dialysis it was 145-155/?. He does not know what BP was last Friday at dialysis. Had cataract surgery on 02/11/16.  Took lopressor 50 mg yesterday after dialysis and BP stayed the same. He is feeling fine. He checked heart rate while on phone with me and it was 60.  He is asking if he needs office visit today or medications adjusted.  I told him I did not think he needed visit today and I would review with Dr. [name].  Has been off metoprolol due to problems with hypotension.

## 2016-02-20 NOTE — Telephone Encounter (Signed)
New message        Pt c/o BP issue: STAT if pt c/o blurred vision, one-sided weakness or slurred speech  1. What are your last 5 BP readings? 175/60, 164/58  2. Are you having any other symptoms (ex. Dizziness, headache, blurred vision, passed out)? no  3. What is your BP issue? [place] today.  He states his bp is high.  Calling to see if he need to have his medication adjusted or should he see someone this afternoon?

## 2016-02-21 DIAGNOSIS — J150 Pneumonia due to Klebsiella pneumoniae: Secondary | ICD-10-CM | POA: Diagnosis not present

## 2016-02-21 DIAGNOSIS — N186 End stage renal disease: Secondary | ICD-10-CM | POA: Diagnosis not present

## 2016-02-21 DIAGNOSIS — J156 Pneumonia due to other aerobic Gram-negative bacteria: Secondary | ICD-10-CM | POA: Diagnosis not present

## 2016-02-24 DIAGNOSIS — Z992 Dependence on renal dialysis: Secondary | ICD-10-CM | POA: Diagnosis not present

## 2016-02-24 DIAGNOSIS — N186 End stage renal disease: Secondary | ICD-10-CM | POA: Diagnosis not present

## 2016-02-26 DIAGNOSIS — J156 Pneumonia due to other aerobic Gram-negative bacteria: Secondary | ICD-10-CM | POA: Diagnosis not present

## 2016-02-26 DIAGNOSIS — J150 Pneumonia due to Klebsiella pneumoniae: Secondary | ICD-10-CM | POA: Diagnosis not present

## 2016-02-26 DIAGNOSIS — N186 End stage renal disease: Secondary | ICD-10-CM | POA: Diagnosis not present

## 2016-02-27 ENCOUNTER — Ambulatory Visit (INDEPENDENT_AMBULATORY_CARE_PROVIDER_SITE_OTHER): Payer: Medicare PPO | Admitting: Podiatry

## 2016-02-27 ENCOUNTER — Encounter: Payer: Self-pay | Admitting: Podiatry

## 2016-02-27 DIAGNOSIS — M79676 Pain in unspecified toe(s): Secondary | ICD-10-CM

## 2016-02-27 DIAGNOSIS — B351 Tinea unguium: Secondary | ICD-10-CM

## 2016-02-27 NOTE — Progress Notes (Signed)
   Subjective:    Patient ID: Terry [name], male    DOB: 1935/01/09, 81 y.o.   MRN: [id number]  HPI this patient presents to my office with  complaint of long thick painful nails.  Nails are painful walking and wearing his shoes. He states he is unable to self treat. Patient does give a history of a back injury which causes numbness into the toes on both feet. He also describes having significant kidney failure as well as heart disease. He presents the office today for definitive evaluation and treatment of his nails.    Review of Systems  All other systems reviewed and are negative.      Objective:   Physical Exam GENERAL APPEARANCE: Alert, conversant. Appropriately groomed. No acute distress.  VASCULAR: Pedal pulses are  palpable at  [facility] and PT bilateral.  Capillary refill time is immediate to all digits,  Normal temperature gradient.   NEUROLOGIC: sensation is normal to 5.07 monofilament at 5/5 sites bilateral.  Light touch is intact bilateral, Muscle strength normal.  MUSCULOSKELETAL: acceptable muscle strength, tone and stability bilateral.  Intrinsic muscluature intact bilateral.  Rectus appearance of foot and digits noted bilateral.   DERMATOLOGIC: skin color, texture, and turgor are within normal limits.  No preulcerative lesions or ulcers  are seen, no interdigital maceration noted.  No open lesions present.   No drainage noted.  NAILS Thick disfigured discolored nails both feet.  Healing noted hallux following his previous surgery..         Assessment & Plan:  Onychomycosis  B/L    IE  Debridement of elongated ingrown toenails B/l  RTC 10 weeks.Gardiner Barefoot DPM

## 2016-02-28 DIAGNOSIS — J156 Pneumonia due to other aerobic Gram-negative bacteria: Secondary | ICD-10-CM | POA: Diagnosis not present

## 2016-02-28 DIAGNOSIS — N186 End stage renal disease: Secondary | ICD-10-CM | POA: Diagnosis not present

## 2016-02-28 DIAGNOSIS — J150 Pneumonia due to Klebsiella pneumoniae: Secondary | ICD-10-CM | POA: Diagnosis not present

## 2016-03-02 ENCOUNTER — Other Ambulatory Visit: Payer: Self-pay | Admitting: Internal Medicine

## 2016-03-02 DIAGNOSIS — N186 End stage renal disease: Secondary | ICD-10-CM | POA: Diagnosis not present

## 2016-03-02 DIAGNOSIS — J150 Pneumonia due to Klebsiella pneumoniae: Secondary | ICD-10-CM | POA: Diagnosis not present

## 2016-03-02 DIAGNOSIS — J156 Pneumonia due to other aerobic Gram-negative bacteria: Secondary | ICD-10-CM | POA: Diagnosis not present

## 2016-03-04 DIAGNOSIS — N186 End stage renal disease: Secondary | ICD-10-CM | POA: Diagnosis not present

## 2016-03-04 DIAGNOSIS — J449 Chronic obstructive pulmonary disease, unspecified: Secondary | ICD-10-CM | POA: Diagnosis not present

## 2016-03-04 DIAGNOSIS — J156 Pneumonia due to other aerobic Gram-negative bacteria: Secondary | ICD-10-CM | POA: Diagnosis not present

## 2016-03-04 DIAGNOSIS — J150 Pneumonia due to Klebsiella pneumoniae: Secondary | ICD-10-CM | POA: Diagnosis not present

## 2016-03-05 ENCOUNTER — Other Ambulatory Visit: Payer: Self-pay | Admitting: Cardiovascular Disease

## 2016-03-06 ENCOUNTER — Other Ambulatory Visit: Payer: Self-pay

## 2016-03-06 DIAGNOSIS — J150 Pneumonia due to Klebsiella pneumoniae: Secondary | ICD-10-CM | POA: Diagnosis not present

## 2016-03-06 DIAGNOSIS — J156 Pneumonia due to other aerobic Gram-negative bacteria: Secondary | ICD-10-CM | POA: Diagnosis not present

## 2016-03-06 DIAGNOSIS — N186 End stage renal disease: Secondary | ICD-10-CM | POA: Diagnosis not present

## 2016-03-06 MED ORDER — ATORVASTATIN CALCIUM 20 MG PO TABS: 20.0000 mg | ORAL_TABLET | Freq: Every day | ORAL | 9 refills | Status: DC

## 2016-03-09 DIAGNOSIS — J150 Pneumonia due to Klebsiella pneumoniae: Secondary | ICD-10-CM | POA: Diagnosis not present

## 2016-03-09 DIAGNOSIS — N186 End stage renal disease: Secondary | ICD-10-CM | POA: Diagnosis not present

## 2016-03-09 DIAGNOSIS — J156 Pneumonia due to other aerobic Gram-negative bacteria: Secondary | ICD-10-CM | POA: Diagnosis not present

## 2016-03-11 DIAGNOSIS — N186 End stage renal disease: Secondary | ICD-10-CM | POA: Diagnosis not present

## 2016-03-11 DIAGNOSIS — J156 Pneumonia due to other aerobic Gram-negative bacteria: Secondary | ICD-10-CM | POA: Diagnosis not present

## 2016-03-11 DIAGNOSIS — J150 Pneumonia due to Klebsiella pneumoniae: Secondary | ICD-10-CM | POA: Diagnosis not present

## 2016-03-13 DIAGNOSIS — J156 Pneumonia due to other aerobic Gram-negative bacteria: Secondary | ICD-10-CM | POA: Diagnosis not present

## 2016-03-13 DIAGNOSIS — N186 End stage renal disease: Secondary | ICD-10-CM | POA: Diagnosis not present

## 2016-03-13 DIAGNOSIS — J150 Pneumonia due to Klebsiella pneumoniae: Secondary | ICD-10-CM | POA: Diagnosis not present

## 2016-03-16 DIAGNOSIS — J156 Pneumonia due to other aerobic Gram-negative bacteria: Secondary | ICD-10-CM | POA: Diagnosis not present

## 2016-03-16 DIAGNOSIS — N186 End stage renal disease: Secondary | ICD-10-CM | POA: Diagnosis not present

## 2016-03-16 DIAGNOSIS — J150 Pneumonia due to Klebsiella pneumoniae: Secondary | ICD-10-CM | POA: Diagnosis not present

## 2016-03-17 DIAGNOSIS — Z992 Dependence on renal dialysis: Secondary | ICD-10-CM | POA: Diagnosis not present

## 2016-03-17 DIAGNOSIS — M6281 Muscle weakness (generalized): Secondary | ICD-10-CM | POA: Diagnosis not present

## 2016-03-17 DIAGNOSIS — I5031 Acute diastolic (congestive) heart failure: Secondary | ICD-10-CM | POA: Diagnosis not present

## 2016-03-17 DIAGNOSIS — L0390 Cellulitis, unspecified: Secondary | ICD-10-CM | POA: Diagnosis not present

## 2016-03-17 DIAGNOSIS — I2510 Atherosclerotic heart disease of native coronary artery without angina pectoris: Secondary | ICD-10-CM | POA: Diagnosis not present

## 2016-03-17 DIAGNOSIS — K922 Gastrointestinal hemorrhage, unspecified: Secondary | ICD-10-CM | POA: Diagnosis not present

## 2016-03-17 DIAGNOSIS — R2681 Unsteadiness on feet: Secondary | ICD-10-CM | POA: Diagnosis not present

## 2016-03-17 DIAGNOSIS — D500 Iron deficiency anemia secondary to blood loss (chronic): Secondary | ICD-10-CM | POA: Diagnosis not present

## 2016-03-17 DIAGNOSIS — J449 Chronic obstructive pulmonary disease, unspecified: Secondary | ICD-10-CM | POA: Diagnosis not present

## 2016-03-18 DIAGNOSIS — J150 Pneumonia due to Klebsiella pneumoniae: Secondary | ICD-10-CM | POA: Diagnosis not present

## 2016-03-18 DIAGNOSIS — J156 Pneumonia due to other aerobic Gram-negative bacteria: Secondary | ICD-10-CM | POA: Diagnosis not present

## 2016-03-18 DIAGNOSIS — N186 End stage renal disease: Secondary | ICD-10-CM | POA: Diagnosis not present

## 2016-03-20 DIAGNOSIS — J156 Pneumonia due to other aerobic Gram-negative bacteria: Secondary | ICD-10-CM | POA: Diagnosis not present

## 2016-03-20 DIAGNOSIS — N186 End stage renal disease: Secondary | ICD-10-CM | POA: Diagnosis not present

## 2016-03-20 DIAGNOSIS — J150 Pneumonia due to Klebsiella pneumoniae: Secondary | ICD-10-CM | POA: Diagnosis not present

## 2016-03-21 DIAGNOSIS — N186 End stage renal disease: Secondary | ICD-10-CM | POA: Diagnosis not present

## 2016-03-21 DIAGNOSIS — Z992 Dependence on renal dialysis: Secondary | ICD-10-CM | POA: Diagnosis not present

## 2016-03-21 DIAGNOSIS — E1122 Type 2 diabetes mellitus with diabetic chronic kidney disease: Secondary | ICD-10-CM | POA: Diagnosis not present

## 2016-03-23 DIAGNOSIS — J156 Pneumonia due to other aerobic Gram-negative bacteria: Secondary | ICD-10-CM | POA: Diagnosis not present

## 2016-03-23 DIAGNOSIS — J150 Pneumonia due to Klebsiella pneumoniae: Secondary | ICD-10-CM | POA: Diagnosis not present

## 2016-03-23 DIAGNOSIS — N186 End stage renal disease: Secondary | ICD-10-CM | POA: Diagnosis not present

## 2016-03-25 DIAGNOSIS — J150 Pneumonia due to Klebsiella pneumoniae: Secondary | ICD-10-CM | POA: Diagnosis not present

## 2016-03-25 DIAGNOSIS — J156 Pneumonia due to other aerobic Gram-negative bacteria: Secondary | ICD-10-CM | POA: Diagnosis not present

## 2016-03-25 DIAGNOSIS — N186 End stage renal disease: Secondary | ICD-10-CM | POA: Diagnosis not present

## 2016-03-27 DIAGNOSIS — J150 Pneumonia due to Klebsiella pneumoniae: Secondary | ICD-10-CM | POA: Diagnosis not present

## 2016-03-27 DIAGNOSIS — N186 End stage renal disease: Secondary | ICD-10-CM | POA: Diagnosis not present

## 2016-03-27 DIAGNOSIS — J156 Pneumonia due to other aerobic Gram-negative bacteria: Secondary | ICD-10-CM | POA: Diagnosis not present

## 2016-03-30 DIAGNOSIS — N186 End stage renal disease: Secondary | ICD-10-CM | POA: Diagnosis not present

## 2016-03-30 DIAGNOSIS — Z992 Dependence on renal dialysis: Secondary | ICD-10-CM | POA: Diagnosis not present

## 2016-04-01 DIAGNOSIS — J150 Pneumonia due to Klebsiella pneumoniae: Secondary | ICD-10-CM | POA: Diagnosis not present

## 2016-04-01 DIAGNOSIS — N186 End stage renal disease: Secondary | ICD-10-CM | POA: Diagnosis not present

## 2016-04-01 DIAGNOSIS — J156 Pneumonia due to other aerobic Gram-negative bacteria: Secondary | ICD-10-CM | POA: Diagnosis not present

## 2016-04-03 DIAGNOSIS — N186 End stage renal disease: Secondary | ICD-10-CM | POA: Diagnosis not present

## 2016-04-03 DIAGNOSIS — J150 Pneumonia due to Klebsiella pneumoniae: Secondary | ICD-10-CM | POA: Diagnosis not present

## 2016-04-03 DIAGNOSIS — J156 Pneumonia due to other aerobic Gram-negative bacteria: Secondary | ICD-10-CM | POA: Diagnosis not present

## 2016-04-03 DIAGNOSIS — J449 Chronic obstructive pulmonary disease, unspecified: Secondary | ICD-10-CM | POA: Diagnosis not present

## 2016-04-06 DIAGNOSIS — J156 Pneumonia due to other aerobic Gram-negative bacteria: Secondary | ICD-10-CM | POA: Diagnosis not present

## 2016-04-06 DIAGNOSIS — N186 End stage renal disease: Secondary | ICD-10-CM | POA: Diagnosis not present

## 2016-04-06 DIAGNOSIS — J150 Pneumonia due to Klebsiella pneumoniae: Secondary | ICD-10-CM | POA: Diagnosis not present

## 2016-04-07 ENCOUNTER — Ambulatory Visit (INDEPENDENT_AMBULATORY_CARE_PROVIDER_SITE_OTHER): Payer: Medicare PPO | Admitting: Internal Medicine

## 2016-04-07 ENCOUNTER — Encounter: Payer: Self-pay | Admitting: Internal Medicine

## 2016-04-07 VITALS — BP 120/58 | HR 67 | Ht 67.5 in | Wt 158.8 lb

## 2016-04-07 DIAGNOSIS — R413 Other amnesia: Secondary | ICD-10-CM | POA: Diagnosis not present

## 2016-04-07 DIAGNOSIS — Z961 Presence of intraocular lens: Secondary | ICD-10-CM | POA: Diagnosis not present

## 2016-04-07 DIAGNOSIS — J449 Chronic obstructive pulmonary disease, unspecified: Secondary | ICD-10-CM | POA: Diagnosis not present

## 2016-04-07 DIAGNOSIS — J9611 Chronic respiratory failure with hypoxia: Secondary | ICD-10-CM

## 2016-04-07 DIAGNOSIS — Z23 Encounter for immunization: Secondary | ICD-10-CM

## 2016-04-07 NOTE — Progress Notes (Signed)
Subjective:    Patient ID: Terry [name], male    DOB: 12/22/1934    MRN: [id number]   Brief patient profile:  85 yowm quit smoking 2001 bothered by doe seemed better then worse in 2010 p "lung infection" and stayed on advair/spiriva  Combo since 2012 seemed better then admitted in [facility] with pna 10/09/12  in then transferred to [facility] x 3 weeks and discharged  On May 1st  2014 referred to pulmonary clinic 11/29/12  by [facility] for copd eval and proved to have GOLD III copd 01/26/13 with symptoms much better off ACEi.    History of Present Illness 08/27/2015  Extended post hosp /transition of care  f/u ov/Wert re: COPD GOLD III recurrent admits/ now chronic resp failure/HD dep CRI plus GOLD III rx symb/spriiva/neb saba Chief Complaint  Patient presents with  . Follow-up    Pt c/o occasional cough with white mucus. Pt denies wheeze/increased SOB/CP/tightness. Pt is on O2/2L, he states that on 3L it dries out his nose and burns. Pt also c/o low BP readings and wonders if some of his HTN meds need to be changed.   now sleeping in a recliner since admit 30-45 degrees due to sob/some noct cough but excess/ purulent sputum or mucus plugs   On prednisone with floor of   @ 10 mg since d/c 08/19/15  rec May need to consider adding Oracit to your regimen and I will le Dr Lorrene Reid know You will need to adjust your 02 to saturations of over 90%  Best fit and humidified 02 ordered  Plan A = Automatic = Symbicort 160 2 in am and 2 in pm with spiriva each am and prednisone 10 mg with breakfast daily  Work on inhaler technique:  Plan B = Backup Only use your albuterol (proventil) as a rescue medication Plan C = Crisis - only use your albuterol nebulizer if you first try Plan B and it fails to help > ok to use the nebulizer up to every 4 hours but if start needing it regularly call for immediate appointment   09/26/2015  f/u ov/Wert re: COPD  GOLD  III on symbicort 160/spiriva and 02 2lpm 24/7  Chief  Complaint  Patient presents with  . Follow-up    Cough has improved some- requesting refill on tessalon.   doe/ weak x room to room  Prednisone 10 mg daily floor for now  No need for saba or neb  At this point  rec Prednisone 10 mg one on even and one-half on odd days x 2 weeks and if not change then reduce to 5 mg daily  Work on maintaining perfect inhaler technique:   Goal for 02 is 90% or above / ok adjust up  Or down or off as along as attaining this goal     11/26/2015  f/u ov/Wert re: copd gold III/ symb/spiriva/ 02 1lpm hs and prn daytime  / still on pred 10 mg daily (maint rx since 08/19/2015 ) Chief Complaint  Patient presents with  . Follow-up    Breathing is doing well. He has not been using rescue inhaler or neb. He has not used o2 much during the day.  never tapered pred as rec "I was afraid to" rec Try prednisone 10 mg one half daily  Plan A = Automatic = symbicort and spiriva  Plan B = Backup Only use your albuterol (ventollin) as a rescue medication Plan C = Crisis - only use your albuterol  nebulizer if you first try Plan B     01/07/2016  f/u ov/Wert re:  Copd GOLD III/ rx 5 prednisone / symb/spiriva and rare saba  (maybe once a week)  02 hs only  Chief Complaint  Patient presents with  . Follow-up    pt states breathing is baseline since last OV. c/o sob & non prod cough.  02 1lpm hs rarely during the day / c/o dry mouth ? From spiriva dpi Doe = MMRC3 = can't walk 100 yards even at a slow pace at a flat grade s stopping due to sob   rec Plan A = Automatic = symbicort 160 Take 2 puffs first thing in am and then another 2 puffs about 12 hours later and spiriva 1.25 x 4 or 2.5 x 2puffs in am only  Plan B = Backup Only use your albuterol (ventolin) as a rescue medication  Plan C = Crisis - only use your albuterol nebulizer if you first try Plan B and it fails to help > ok to use the nebulizer up to every 4 hours but if start needing it regularly call for immediate  appointment Prednisone 5 mg daily   Please schedule a follow up visit in 3 months but call sooner if needed     04/07/2016  f/u ov/Wert re: COPD GOLD III  On symbicort 160 / one capsule spiriva /still pred 5 mg daily  ? On amiodarone  no use of 02 at all / rare saba  And doesn't carry with him anymore Very confused with meds/ doesn't recognize amiodarone as something he's ever taken Reports though feels the best he has in a long time, walking "[place]" on a recent trip albeit slow pace Doe = MMRC2 = can't walk a nl pace on a flat grade s sob but does fine slow and flat anywhere on RA       No obvious day to day or daytime variabilty or assoc excess/ purulent sputum or mucus plugs    cp or chest tightness, subjective wheeze overt sinus or hb symptoms. No unusual exp hx or h/o childhood pna/ asthma or knowledge of premature birth.   Sleeping ok without nocturnal  or early am exacerbation  of respiratory  c/o's or need for noct saba. Also denies any obvious fluctuation of symptoms with weather or environmental changes or other aggravating or alleviating factors except as outlined above   Current Medications, Allergies, Complete Past Medical History, Past Surgical History, Family History, and Social History were reviewed in [employer] record.  ROS  The following are not active complaints unless bolded sore throat, dysphagia, dental problems, itching, sneezing,  nasal congestion or excess/ purulent secretions, ear ache,   fever, chills, sweats, unintended wt loss, pleuritic or exertional cp, hemoptysis,  orthopnea pnd or leg swelling, presyncope, palpitations, heartburn, abdominal pain, anorexia, nausea, vomiting, diarrhea  or change in bowel or urinary habits, change in stools or urine, dysuria,hematuria,  rash, arthralgias, visual complaints, headache, numbness weakness improving or ataxia or problems with walking or coordination,  change in mood/affect or  memory.           Objective:   Physical Exam  Somber amb wm nad    02/26/2015          210 >  08/27/2015 163 p HD > 09/26/2015  159 > 11/26/2015  160 > 01/07/2016  159 > 04/07/2016  159     10/22/14 202 lb (91.627 kg)  08/28/14 203 lb 8 oz (92.307 kg)  08/20/14 205 lb 6.4 oz (93.169 kg)    Vital signs reviewed   - Note on arrival 02 sats  92% on RA    HEENT: nl dentition, turbinates, and oropharynx. Nl external ear canals without cough reflex   NECK :  without JVD/Nodes/TM/ nl carotid upstrokes bilaterally   LUNGS: no acc muscle use,  Barrel chest, moderate, with increased Texp but no wheeze or rhonchi   CV:  RRR  no s3 or murmur or increase in P2, no edema   ABD:  soft and nontender with nl inspiratory excursion in the supine position. No bruits or organomegaly, bowel sounds nl  MS:  Nl gait/ ext warm without deformities, calf tenderness, cyanosis or clubbing No obvious joint restrictions   SKIN: warm and dry without lesions    NEURO:  alert, approp, nl sensorium with  no motor deficits       I personally reviewed images and agree with radiology impression as follows:  CXR:  08/14/15 Stable exam. Emphysema with cardiomegaly and bibasilar atelectasis.       Assessment & Plan:

## 2016-04-07 NOTE — Patient Instructions (Addendum)
Prevnar 13 today   Only use your albuterol as a rescue medication to be used if you can't catch your breath by resting or doing a relaxed purse lip breathing pattern.  - The less you use it, the better it will work when you need it. - Ok to use up to 2 puffs  every 4 hours if you must but call for immediate appointment if use goes up over your usual need - Don't leave home without it !!  (think of it like the spare tire for your car)    See Tammy NP 4 weeks with all your medications, even over the counter meds, separated in two separate bags, the ones you take no matter what vs the ones you stop once you feel better and take only as needed when you feel you need them.   Tammy  will generate for you a new user friendly medication calendar that will put [place] all on the same page re: your medication use.     Without this process, it simply isn't possible to assure that we are providing  your outpatient care  with  the attention to detail we feel you deserve.   If we cannot assure that you're getting that kind of care,  then we cannot manage your problem effectively from this clinic.  Once you have seen Tammy and we are sure that we're all on the same page with your medication use she will arrange follow up with me.  Add needs MMSE testing also and at least bmet/ cbc/tsh/ plus esr if on amio

## 2016-04-08 ENCOUNTER — Encounter: Payer: Self-pay | Admitting: Internal Medicine

## 2016-04-08 DIAGNOSIS — R413 Other amnesia: Secondary | ICD-10-CM | POA: Insufficient documentation

## 2016-04-08 DIAGNOSIS — J150 Pneumonia due to Klebsiella pneumoniae: Secondary | ICD-10-CM | POA: Diagnosis not present

## 2016-04-08 DIAGNOSIS — J156 Pneumonia due to other aerobic Gram-negative bacteria: Secondary | ICD-10-CM | POA: Diagnosis not present

## 2016-04-08 DIAGNOSIS — N186 End stage renal disease: Secondary | ICD-10-CM | POA: Diagnosis not present

## 2016-04-08 NOTE — Assessment & Plan Note (Signed)
He has always seemed to me easily confused with details of care and now not recognizing meds on his list at all and is argumentative when these issues are raised.  I strongly rec he return to [facility] for overall health evaluation but he refused   I told him the only way it would be safe for me to continue to treat him for pulmonary problems would be if he first returned (hopefully with his wife) for full med rec and MMSE testing    To keep things simple, I have asked the patient to first separate medicines that are perceived as maintenance, that is to be taken daily "no matter what", from those medicines that are taken on only on an as-needed basis and I have given the patient examples of both, and then return to see our NP to generate a  detailed  medication calendar which should be followed until the next physician sees the patient and updates it.

## 2016-04-08 NOTE — Addendum Note (Signed)
Addended by: Rosana Berger on: 04/08/2016 10:08 AM   Modules accepted: Orders

## 2016-04-08 NOTE — Assessment & Plan Note (Signed)
02 rx 2.5  lpm 24/7 as of d/c 08/14/15   As of 04/07/2016 =  Only uses prn and says wife insists he keep it though no longer using x months

## 2016-04-08 NOTE — Assessment & Plan Note (Signed)
-   Trial off ACEi 11/30/2012 >>> much better symptom control - 05/28/2014   Walked RA x one lap @ 185 stopped due to  [employer] / sats ok/ mod pace - PFT's 01/26/13      FEV1  1.28 (48%) ratio 48 and no better p B2,  DLCO 39 corrects to 48%  - PFTs 02/26/2015   FEV1 0.99  (35 % ) ratio 35  p 11 % improvement from saba with DLCO  24 % corrects to 35  % for alv volume  p am symb/spiriva    - Pred maint rx with floor of 10 mg per day at d/c 08/14/15  - 09/26/2015  extensive coaching HFA effectiveness =    75%  - 11/26/2015 rec reduce pred to 5 mg daily   - 01/07/2016  After extensive coaching HFA effectiveness =  90% with respimat so changed spiriva to respimat  And maintain pred 5 mg daily    The goal with a chronic steroid dependent illness is always arriving at the lowest effective dose that controls the disease/symptoms and not accepting a set "formula" which is based on statistics or guidelines that don't always take into account patient  variability or the natural hx of the dz in every individual patient, which may well vary over time.  For now therefore I recommend the patient maintain  5 mg daily esp since he may now be on amiodarone and prior to daily steroids was really struggling to stay out of hosp and now is fully ambulatory/ active albeit at slow pace off all 02   I had an extended discussion with the patient reviewing all relevant studies completed to date and  lasting 15 to 20 minutes of a 25 minute visit    Each maintenance medication was reviewed in detail including most importantly the difference between maintenance and prns and under what circumstances the prns are to be triggered using an action plan format that is not reflected in the computer generated alphabetically organized AVS.    Please see instructions for details which were reviewed in writing and the patient given a copy highlighting the part that I personally wrote and discussed at today's ov.

## 2016-04-09 NOTE — Progress Notes (Signed)
HPI:  Terry [name] is a pleasant 81 year old with a very complicated state of health, see past medical history notable for end-stage renal disease on dialysis, coronary artery disease, hypertension, hyperlipidemia, atrial fibrillation, COPD, history of stroke, managed by several specialist, here for an acute visit for:  Flank pain and a rash: -pain started a few days ago L flank, rash on back started yesterday -denies: fevers, chills, vomiting, diarrhea, hematochezia, melena -has mild constipation, resolved with mirilax -he is going to his dialysis from here today   ROS: See pertinent positives and negatives per HPI.  Past Medical History:  Diagnosis Date  . Acute and chronic respiratory failure 10/22/2012  . Atrial fibrillation ([place])   . CAD (coronary artery disease) [place], [place]  . COPD (chronic obstructive pulmonary disease) ([place])   . High cholesterol   . Hypertension   . 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 Osteoarthritis 10/22/2012  . Pneumonia April 2014  . Shortness of breath dyspnea   . Stroke [facility] [address]) 2012    Past Surgical History:  Procedure Laterality Date  . APPENDECTOMY  1984  . AV FISTULA PLACEMENT Right 05/21/2015   Procedure: Right Arm Brachiocephalic ARTERIOVENOUS (AV) FISTULA CREATION;  Surgeon: Terry Mould, MD;  Location: [place];  Service: Vascular;  Laterality: Right;  . COLON SURGERY  2012   partial colon removed - twisted bowel  . CORONARY STENT PLACEMENT  1999   2 stents  . CYSTOSCOPY/RETROGRADE/URETEROSCOPY Bilateral 04/23/2014   Procedure: CYSTOSCOPY BILATERAL RETROGRADE,  LEFT URETEROSCOPY, LEFT STENT PLACEMENT;  Surgeon: Terry Bring, MD;  Location: [facility];  Service: Urology;  Laterality: Bilateral;  . HERNIA REPAIR     Right inguinal    Family History  Problem Relation Age of Onset  . Cancer Mother     Breast  . Heart disease Father   . Heart attack Father   . Parkinson's disease Brother      Social History   Social History  . Marital status: Married    Spouse name: N/A  . Number of children: 3  . Years of education: N/A   Occupational History  . retired-worked in radio Retired   Social History Main Topics  . Smoking status: Former Smoker    Packs/day: 1.00    Years: 49.00    Types: Cigarettes, Pipe, Cigars    Quit date: 02/21/2000  . Smokeless tobacco: Never Used  . Alcohol use No  . Drug use: No  . Sexual activity: Not Asked   Other Topics Concern  . None   Social History Narrative   Work or School: retired [place] Situation: lives with wife in [name] regular; poor diet              Current Outpatient Prescriptions:  .  albuterol (VENTOLIN HFA) 108 (90 Base) MCG/ACT inhaler, Inhale 2 puffs into the lungs every 4 (four) hours as needed for wheezing or shortness of breath., Disp: 1 Inhaler, Rfl: 11 .  amiodarone (PACERONE) 200 MG tablet, Take 1 tablet (200 mg total) by mouth daily., Disp: 30 tablet, Rfl: 11 .  aspirin EC 325 MG tablet, Take 1 tablet (325 mg total) by mouth daily., Disp: 30 tablet, Rfl: 0 .  atorvastatin (LIPITOR) 20 MG tablet, Take 1 tablet (20 mg total) by mouth daily., Disp: 30 tablet, Rfl: 9 .  budesonide-formoterol (SYMBICORT) 160-4.5 MCG/ACT inhaler, Inhale 2 puffs into the lungs 2 (two) times daily., Disp: 3 Inhaler, Rfl: 3 .  calcium acetate (PHOSLO) 667 MG capsule, Take 1 to 2 capsules by mouth with each meal or snack., Disp: , Rfl:  .  cholecalciferol (VITAMIN D) 1000 units tablet, Take 1,000 Units by mouth daily., Disp: , Rfl:  .  ethyl chloride spray, Use as directed on dialysis days., Disp: , Rfl:  .  guaiFENesin (MUCINEX) 600 MG 12 hr tablet, Take 600 mg by mouth 2 (two) times daily., Disp: , Rfl:  .  methocarbamol (ROBAXIN) 500 MG tablet, Take 1 tablet (500 mg total) by mouth every 8 (eight) hours as needed for muscle spasms., Disp: 30 tablet, Rfl:  0 .  polyethylene glycol (MIRALAX) packet, Take 17 g by mouth daily as needed for moderate constipation., Disp: 30 each, Rfl: 0 .  predniSONE (DELTASONE) 10 MG tablet, TAKE ONE TABLET BY MOUTH ONCE DAILY WITH BREAKFAST (Patient taking differently: 1/2 tablet daily), Disp: 30 tablet, Rfl: 2 .  senna-docusate (SENOKOT S) 8.6-50 MG tablet, Take 2 tablets by mouth at bedtime as needed for mild constipation., Disp: 60 tablet, Rfl: 3 .  SPIRIVA HANDIHALER 18 MCG inhalation capsule, Place 2 puffs into inhaler and inhale daily., Disp: , Rfl:   EXAM:  Vitals:   04/10/16 1040  BP: (!) 138/50  Pulse: 90  Temp: 98.2 F (36.8 C)    Body mass index is 24.3 kg/m.  GENERAL: vitals reviewed and listed above, alert, oriented, appears well hydrated and in no acute distress  HEENT: atraumatic, conjunttiva clear, no obvious abnormalities on inspection of external nose and ears  NECK: no obvious masses on inspection  LUNGS: clear to auscultation bilaterally, no wheezes, rales or rhonchi, good air movement  CV: HRRR, no peripheral edema  ABD: BS+, soft, NTTP except alone upper L abd in area of dermatome involved with rash  SKIN: erythematous vesicular rash in dermatomal patter L flank  MS: moves all extremities without noticeable abnormality  PSYCH: pleasant and cooperative, no obvious depression or anxiety  ASSESSMENT AND PLAN:  Discussed the following assessment and plan:  Herpes zoster without complication  ESRD (end stage renal disease) (HCC)  Generalized abdominal pain  -we discussed possible serious and likely etiologies, workup and treatment, treatment risks and return precautions - shingles on exam (believe his abdominal pain is also actually shingles pain) -will treat with valtrex: -we contacted Terry [name] and Terry [name] for assistance with valtrex dosing on HD - both advised needs to be done by his dialysis attending and they provided [place] the number to call his dialysis  center. Both my assistant and I called the dialysis center many times each and the phone rang and rang and then disconnected. We called multiple numbers provided by pt, Terry [name] [name] and from [employer] and we were not able to get through.I finally reached pt on his phone at the dialysis center and told him the situation - he told a nurse there while on the phone with me and she told him she would tell the doctor and let us know or they would rx. I advised him not to leave the dialysis center until they have confirmed his treatment for this. He agreed with this plan and I advised him to contact us if any further assistance needed. -Patient advised to return or notify a doctor immediately if symptoms worsen or persist or new concerns arise.  There are no Patient Instructions on file  for this visit.  Colin Benton R., DO

## 2016-04-10 ENCOUNTER — Ambulatory Visit (INDEPENDENT_AMBULATORY_CARE_PROVIDER_SITE_OTHER): Payer: Medicare PPO | Admitting: Family Medicine

## 2016-04-10 ENCOUNTER — Telehealth: Payer: Self-pay | Admitting: *Deleted

## 2016-04-10 ENCOUNTER — Encounter: Payer: Self-pay | Admitting: Family Medicine

## 2016-04-10 VITALS — BP 138/50 | HR 90 | Temp 98.2°F | Ht 67.5 in | Wt 157.5 lb

## 2016-04-10 DIAGNOSIS — R1084 Generalized abdominal pain: Secondary | ICD-10-CM

## 2016-04-10 DIAGNOSIS — N186 End stage renal disease: Secondary | ICD-10-CM | POA: Diagnosis not present

## 2016-04-10 DIAGNOSIS — B029 Zoster without complications: Secondary | ICD-10-CM | POA: Diagnosis not present

## 2016-04-10 DIAGNOSIS — J150 Pneumonia due to Klebsiella pneumoniae: Secondary | ICD-10-CM | POA: Diagnosis not present

## 2016-04-10 DIAGNOSIS — J156 Pneumonia due to other aerobic Gram-negative bacteria: Secondary | ICD-10-CM | POA: Diagnosis not present

## 2016-04-10 MED ORDER — VALACYCLOVIR HCL 500 MG PO TABS: 500.0000 mg | ORAL_TABLET | Freq: Every day | ORAL | 0 refills | Status: DC

## 2016-04-10 NOTE — Telephone Encounter (Signed)
I called the pt and informed him of the message below and he is aware the Rx was sent to his pharmacy.

## 2016-04-10 NOTE — Telephone Encounter (Signed)
Angie called from the kidney center stating Roger Kill advised Valacyclovir 500mg  QHS for 7 days for treatment of shingles.  Message forwarded to Dr Maudie Mercury.

## 2016-04-10 NOTE — Progress Notes (Signed)
Pre visit review using our clinic review tool, if applicable. No additional management support is needed unless otherwise documented below in the visit note. 

## 2016-04-13 DIAGNOSIS — N186 End stage renal disease: Secondary | ICD-10-CM | POA: Diagnosis not present

## 2016-04-13 DIAGNOSIS — J156 Pneumonia due to other aerobic Gram-negative bacteria: Secondary | ICD-10-CM | POA: Diagnosis not present

## 2016-04-13 DIAGNOSIS — J150 Pneumonia due to Klebsiella pneumoniae: Secondary | ICD-10-CM | POA: Diagnosis not present

## 2016-04-15 DIAGNOSIS — J150 Pneumonia due to Klebsiella pneumoniae: Secondary | ICD-10-CM | POA: Diagnosis not present

## 2016-04-15 DIAGNOSIS — J156 Pneumonia due to other aerobic Gram-negative bacteria: Secondary | ICD-10-CM | POA: Diagnosis not present

## 2016-04-15 DIAGNOSIS — N186 End stage renal disease: Secondary | ICD-10-CM | POA: Diagnosis not present

## 2016-04-16 DIAGNOSIS — M6281 Muscle weakness (generalized): Secondary | ICD-10-CM | POA: Diagnosis not present

## 2016-04-16 DIAGNOSIS — L0390 Cellulitis, unspecified: Secondary | ICD-10-CM | POA: Diagnosis not present

## 2016-04-16 DIAGNOSIS — I5031 Acute diastolic (congestive) heart failure: Secondary | ICD-10-CM | POA: Diagnosis not present

## 2016-04-16 DIAGNOSIS — J449 Chronic obstructive pulmonary disease, unspecified: Secondary | ICD-10-CM | POA: Diagnosis not present

## 2016-04-16 DIAGNOSIS — K922 Gastrointestinal hemorrhage, unspecified: Secondary | ICD-10-CM | POA: Diagnosis not present

## 2016-04-16 DIAGNOSIS — D500 Iron deficiency anemia secondary to blood loss (chronic): Secondary | ICD-10-CM | POA: Diagnosis not present

## 2016-04-16 DIAGNOSIS — I2510 Atherosclerotic heart disease of native coronary artery without angina pectoris: Secondary | ICD-10-CM | POA: Diagnosis not present

## 2016-04-16 DIAGNOSIS — Z992 Dependence on renal dialysis: Secondary | ICD-10-CM | POA: Diagnosis not present

## 2016-04-16 DIAGNOSIS — R2681 Unsteadiness on feet: Secondary | ICD-10-CM | POA: Diagnosis not present

## 2016-04-17 ENCOUNTER — Telehealth: Payer: Self-pay | Admitting: Family Medicine

## 2016-04-17 DIAGNOSIS — J150 Pneumonia due to Klebsiella pneumoniae: Secondary | ICD-10-CM | POA: Diagnosis not present

## 2016-04-17 DIAGNOSIS — N186 End stage renal disease: Secondary | ICD-10-CM | POA: Diagnosis not present

## 2016-04-17 DIAGNOSIS — J156 Pneumonia due to other aerobic Gram-negative bacteria: Secondary | ICD-10-CM | POA: Diagnosis not present

## 2016-04-17 NOTE — Telephone Encounter (Signed)
° °  Pt said he is out of the following med that he was taking for Shingles. He is asking if it can be refill or if something else can be given    valACYclovir (VALTREX) 500 MG tablet   [facility]

## 2016-04-17 NOTE — Telephone Encounter (Signed)
This is only meant to be taken for a specific duration and not repeated. Advise follow up if pain concerns, feels worsening, etc. Shingles usually lasts about 1 month, sometimes longer.

## 2016-04-17 NOTE — Telephone Encounter (Signed)
I called the pt and informed him of the message below and he stated he is still having pain.  I offered the pt an appt for today and he stated he cannot come in today as he is going out of town.  Appt was scheduled for Tuesday at 2:30pm.

## 2016-04-20 DIAGNOSIS — J150 Pneumonia due to Klebsiella pneumoniae: Secondary | ICD-10-CM | POA: Diagnosis not present

## 2016-04-20 DIAGNOSIS — N186 End stage renal disease: Secondary | ICD-10-CM | POA: Diagnosis not present

## 2016-04-20 DIAGNOSIS — J156 Pneumonia due to other aerobic Gram-negative bacteria: Secondary | ICD-10-CM | POA: Diagnosis not present

## 2016-04-21 ENCOUNTER — Ambulatory Visit (INDEPENDENT_AMBULATORY_CARE_PROVIDER_SITE_OTHER): Payer: Medicare PPO | Admitting: Family Medicine

## 2016-04-21 ENCOUNTER — Encounter: Payer: Self-pay | Admitting: Family Medicine

## 2016-04-21 VITALS — BP 140/50 | HR 90 | Temp 98.3°F | Ht 67.5 in | Wt 155.0 lb

## 2016-04-21 DIAGNOSIS — Z992 Dependence on renal dialysis: Secondary | ICD-10-CM | POA: Diagnosis not present

## 2016-04-21 DIAGNOSIS — B029 Zoster without complications: Secondary | ICD-10-CM | POA: Diagnosis not present

## 2016-04-21 DIAGNOSIS — N186 End stage renal disease: Secondary | ICD-10-CM | POA: Diagnosis not present

## 2016-04-21 DIAGNOSIS — E1122 Type 2 diabetes mellitus with diabetic chronic kidney disease: Secondary | ICD-10-CM | POA: Diagnosis not present

## 2016-04-21 NOTE — Patient Instructions (Signed)
Tylenol would probably be the safest option for pain.  Please follow recommendations from your dialysis team in terms of other options for pain management.

## 2016-04-21 NOTE — Progress Notes (Signed)
HPI:  Shingles rash: This is improving but still has pain. He is not sure what he can use for pain. No fevers, malaise, worsening, crusting, oozing. On HD and sees dialysis doctor tomorrow.  ROS: See pertinent positives and negatives per HPI.  Past Medical History:  Diagnosis Date  . Acute and chronic respiratory failure 10/22/2012  . Atrial fibrillation ([place])   . CAD (coronary artery disease) [place], [place]  . COPD (chronic obstructive pulmonary disease) ([place])   . High cholesterol   . Hypertension   . Kidney disease    CKD4, sees [facility], considering dialysis  . Osteoarthritis 10/22/2012  . Pneumonia April 2014  . Shortness of breath dyspnea   . Stroke [name]) 2012    Past Surgical History:  Procedure Laterality Date  . APPENDECTOMY  1984  . AV FISTULA PLACEMENT Right 05/21/2015   Procedure: Right Arm Brachiocephalic ARTERIOVENOUS (AV) FISTULA CREATION;  Surgeon: Angelia Mould, MD;  Location: [facility];  Service: Vascular;  Laterality: Right;  . COLON SURGERY  2012   partial colon removed - twisted bowel  . CORONARY STENT PLACEMENT  1999   2 stents  . CYSTOSCOPY/RETROGRADE/URETEROSCOPY Bilateral 04/23/2014   Procedure: CYSTOSCOPY BILATERAL RETROGRADE,  LEFT URETEROSCOPY, LEFT STENT PLACEMENT;  Surgeon: Raynelle Bring, MD;  Location: [facility];  Service: Urology;  Laterality: Bilateral;  . HERNIA REPAIR     Right inguinal    Family History  Problem Relation Age of Onset  . Cancer Mother     Breast  . Heart disease Father   . Heart attack Father   . Parkinson's disease Brother     Social History   Social History  . Marital status: Married    Spouse name: N/A  . Number of children: 3  . Years of education: N/A   Occupational History  . retired-worked in radio Retired   Social History Main Topics  . Smoking status: Former Smoker    Packs/day: 1.00    Years: 49.00    Types: Cigarettes, Pipe, Cigars    Quit date: 02/21/2000  . Smokeless tobacco:  Never Used  . Alcohol use No  . Drug use: No  . Sexual activity: Not Asked   Other Topics Concern  . None   Social History Narrative   Work or School: retired [place] Situation: lives with wife in [place] regular; poor diet              Current Outpatient Prescriptions:  .  albuterol (VENTOLIN HFA) 108 (90 Base) MCG/ACT inhaler, Inhale 2 puffs into the lungs every 4 (four) hours as needed for wheezing or shortness of breath., Disp: 1 Inhaler, Rfl: 11 .  amiodarone (PACERONE) 200 MG tablet, Take 1 tablet (200 mg total) by mouth daily., Disp: 30 tablet, Rfl: 11 .  aspirin EC 325 MG tablet, Take 1 tablet (325 mg total) by mouth daily., Disp: 30 tablet, Rfl: 0 .  atorvastatin (LIPITOR) 20 MG tablet, Take 1 tablet (20 mg total) by mouth daily., Disp: 30 tablet, Rfl: 9 .  budesonide-formoterol (SYMBICORT) 160-4.5 MCG/ACT inhaler, Inhale 2 puffs into the lungs 2 (two) times daily., Disp: 3 Inhaler, Rfl: 3 .  calcium acetate (PHOSLO) 667 MG capsule, Take 1 to 2 capsules by mouth with each meal or snack., Disp: , Rfl:  .  cholecalciferol (VITAMIN D) 1000 units tablet, Take 1,000  Units by mouth daily., Disp: , Rfl:  .  ethyl chloride spray, Use as directed on dialysis days., Disp: , Rfl:  .  guaiFENesin (MUCINEX) 600 MG 12 hr tablet, Take 600 mg by mouth 2 (two) times daily., Disp: , Rfl:  .  methocarbamol (ROBAXIN) 500 MG tablet, Take 1 tablet (500 mg total) by mouth every 8 (eight) hours as needed for muscle spasms., Disp: 30 tablet, Rfl: 0 .  polyethylene glycol (MIRALAX) packet, Take 17 g by mouth daily as needed for moderate constipation., Disp: 30 each, Rfl: 0 .  predniSONE (DELTASONE) 10 MG tablet, TAKE ONE TABLET BY MOUTH ONCE DAILY WITH BREAKFAST (Patient taking differently: 1/2 tablet daily), Disp: 30 tablet, Rfl: 2 .  senna-docusate (SENOKOT S) 8.6-50 MG tablet, Take 2 tablets by mouth at bedtime as needed for  mild constipation., Disp: 60 tablet, Rfl: 3 .  SPIRIVA HANDIHALER 18 MCG inhalation capsule, Place 2 puffs into inhaler and inhale daily., Disp: , Rfl:  .  valACYclovir (VALTREX) 500 MG tablet, Take 1 tablet (500 mg total) by mouth at bedtime., Disp: 7 tablet, Rfl: 0  EXAM:  Vitals:   04/21/16 1431  BP: (!) 140/50  Pulse: 90  Temp: 98.3 F (36.8 C)    Body mass index is 23.92 kg/m.  GENERAL: vitals reviewed and listed above, alert, oriented, appears well hydrated and in no acute distress  HEENT: atraumatic, conjunttiva clear, no obvious abnormalities on inspection of external nose and ears  NECK: no obvious masses on inspection  LUNGS: clear to auscultation bilaterally, no wheezes, rales or rhonchi, good air movement  CV: HRRR, no peripheral edema  SKIN: The appearance of his shingles rash on the left flank is much improved from his prior visit, with drying out of the lesions and no new lesions on exam. No signs of infection.  MS: moves all extremities without noticeable abnormality  PSYCH: pleasant and cooperative, no obvious depression or anxiety  ASSESSMENT AND PLAN:  Discussed the following assessment and plan:  Herpes zoster without complication  -Again, discussed the natural course of shingles and that pain usually does persist for several weeks to a month, sometimes longer -Seems that he actually is improving and the rash looks much better than it did his recent visit -Discussed options for pain and Tylenol with would likely be the safest with his kidney disease -he is anxious about taking any medications without first running this by his dialysis doctor so he plans to contact them first -I did advise that if the Tylenol is not enough for the pain, that he discuss with his dialysis doctor what would be recommended otherwise for pain with his end-stage kidney disease - perhaps a low dose of Norco.  -Patient advised to return or notify a doctor immediately if symptoms  worsen or persist or new concerns arise.  Patient Instructions  Tylenol would probably be the safest option for pain.  Please follow recommendations from your dialysis team in terms of other options for pain management.      Colin Benton R., DO

## 2016-04-21 NOTE — Progress Notes (Signed)
Pre visit review using our clinic review tool, if applicable. No additional management support is needed unless otherwise documented below in the visit note. 

## 2016-04-22 DIAGNOSIS — J156 Pneumonia due to other aerobic Gram-negative bacteria: Secondary | ICD-10-CM | POA: Diagnosis not present

## 2016-04-22 DIAGNOSIS — J150 Pneumonia due to Klebsiella pneumoniae: Secondary | ICD-10-CM | POA: Diagnosis not present

## 2016-04-22 DIAGNOSIS — N186 End stage renal disease: Secondary | ICD-10-CM | POA: Diagnosis not present

## 2016-04-24 DIAGNOSIS — N186 End stage renal disease: Secondary | ICD-10-CM | POA: Diagnosis not present

## 2016-04-24 DIAGNOSIS — J156 Pneumonia due to other aerobic Gram-negative bacteria: Secondary | ICD-10-CM | POA: Diagnosis not present

## 2016-04-24 DIAGNOSIS — J150 Pneumonia due to Klebsiella pneumoniae: Secondary | ICD-10-CM | POA: Diagnosis not present

## 2016-04-27 DIAGNOSIS — J150 Pneumonia due to Klebsiella pneumoniae: Secondary | ICD-10-CM | POA: Diagnosis not present

## 2016-04-27 DIAGNOSIS — N186 End stage renal disease: Secondary | ICD-10-CM | POA: Diagnosis not present

## 2016-04-27 DIAGNOSIS — J156 Pneumonia due to other aerobic Gram-negative bacteria: Secondary | ICD-10-CM | POA: Diagnosis not present

## 2016-04-28 ENCOUNTER — Other Ambulatory Visit: Payer: Medicare PPO | Admitting: *Deleted

## 2016-04-28 DIAGNOSIS — Z79899 Other long term (current) drug therapy: Secondary | ICD-10-CM

## 2016-04-28 DIAGNOSIS — I5032 Chronic diastolic (congestive) heart failure: Secondary | ICD-10-CM | POA: Diagnosis not present

## 2016-04-28 DIAGNOSIS — I4891 Unspecified atrial fibrillation: Secondary | ICD-10-CM

## 2016-04-28 LAB — HEPATIC FUNCTION PANEL
ALBUMIN: 4.5 g/dL (ref 3.6–5.1)
ALT: 41 U/L (ref 9–46)
AST: 35 U/L (ref 10–35)
Alkaline Phosphatase: 45 U/L (ref 40–115)
BILIRUBIN DIRECT: 0.1 mg/dL (ref <=0.2)
BILIRUBIN TOTAL: 0.5 mg/dL (ref 0.2–1.2)
Indirect Bilirubin: 0.4 mg/dL (ref 0.2–1.2)
Total Protein: 6.9 g/dL (ref 6.1–8.1)

## 2016-04-28 LAB — TSH: TSH: 5.08 m[IU]/L — AB (ref 0.40–4.50)

## 2016-04-29 DIAGNOSIS — J156 Pneumonia due to other aerobic Gram-negative bacteria: Secondary | ICD-10-CM | POA: Diagnosis not present

## 2016-04-29 DIAGNOSIS — J150 Pneumonia due to Klebsiella pneumoniae: Secondary | ICD-10-CM | POA: Diagnosis not present

## 2016-04-29 DIAGNOSIS — N186 End stage renal disease: Secondary | ICD-10-CM | POA: Diagnosis not present

## 2016-05-01 DIAGNOSIS — J156 Pneumonia due to other aerobic Gram-negative bacteria: Secondary | ICD-10-CM | POA: Diagnosis not present

## 2016-05-01 DIAGNOSIS — N186 End stage renal disease: Secondary | ICD-10-CM | POA: Diagnosis not present

## 2016-05-01 DIAGNOSIS — J150 Pneumonia due to Klebsiella pneumoniae: Secondary | ICD-10-CM | POA: Diagnosis not present

## 2016-05-04 DIAGNOSIS — J449 Chronic obstructive pulmonary disease, unspecified: Secondary | ICD-10-CM | POA: Diagnosis not present

## 2016-05-04 DIAGNOSIS — N186 End stage renal disease: Secondary | ICD-10-CM | POA: Diagnosis not present

## 2016-05-04 DIAGNOSIS — J156 Pneumonia due to other aerobic Gram-negative bacteria: Secondary | ICD-10-CM | POA: Diagnosis not present

## 2016-05-04 DIAGNOSIS — J150 Pneumonia due to Klebsiella pneumoniae: Secondary | ICD-10-CM | POA: Diagnosis not present

## 2016-05-05 ENCOUNTER — Telehealth: Payer: Self-pay | Admitting: Pediatrics

## 2016-05-05 ENCOUNTER — Ambulatory Visit: Payer: Medicare PPO | Admitting: Internal Medicine

## 2016-05-05 DIAGNOSIS — I5032 Chronic diastolic (congestive) heart failure: Secondary | ICD-10-CM

## 2016-05-05 NOTE — Telephone Encounter (Signed)
-----   Message from Burnell Blanks, MD sent at 04/30/2016  8:46 AM EST ----- Noted. I would still have him come back for thyroid testing as planned. cdm

## 2016-05-05 NOTE — Telephone Encounter (Signed)
Repeat TSH,. T3, and T4 in 4 weeks-per 11/8 note from Dr. [name]. Appt sch, orders entered, and linked.

## 2016-05-06 ENCOUNTER — Encounter: Payer: Self-pay | Admitting: Internal Medicine

## 2016-05-06 ENCOUNTER — Ambulatory Visit (INDEPENDENT_AMBULATORY_CARE_PROVIDER_SITE_OTHER)
Admission: RE | Admit: 2016-05-06 | Discharge: 2016-05-06 | Disposition: A | Discharge status: Home / Self Care | Payer: Medicare PPO | Source: Ambulatory Visit | Attending: Internal Medicine | Admitting: Internal Medicine

## 2016-05-06 ENCOUNTER — Ambulatory Visit (INDEPENDENT_AMBULATORY_CARE_PROVIDER_SITE_OTHER): Payer: Medicare PPO | Admitting: Internal Medicine

## 2016-05-06 VITALS — BP 124/70 | HR 90 | Temp 98.2°F | Ht 68.0 in | Wt 157.0 lb

## 2016-05-06 DIAGNOSIS — J9611 Chronic respiratory failure with hypoxia: Secondary | ICD-10-CM | POA: Diagnosis not present

## 2016-05-06 DIAGNOSIS — J150 Pneumonia due to Klebsiella pneumoniae: Secondary | ICD-10-CM | POA: Diagnosis not present

## 2016-05-06 DIAGNOSIS — R05 Cough: Secondary | ICD-10-CM | POA: Diagnosis not present

## 2016-05-06 DIAGNOSIS — J449 Chronic obstructive pulmonary disease, unspecified: Secondary | ICD-10-CM

## 2016-05-06 DIAGNOSIS — N186 End stage renal disease: Secondary | ICD-10-CM | POA: Diagnosis not present

## 2016-05-06 DIAGNOSIS — J156 Pneumonia due to other aerobic Gram-negative bacteria: Secondary | ICD-10-CM | POA: Diagnosis not present

## 2016-05-06 MED ORDER — AZITHROMYCIN 250 MG PO TABS: ORAL_TABLET | ORAL | 0 refills | Status: DC

## 2016-05-06 NOTE — Progress Notes (Signed)
Spoke with pt and notified of results per Dr. Wert. Pt verbalized understanding and denied any questions. 

## 2016-05-06 NOTE — Progress Notes (Signed)
Subjective:    Patient ID: Terry [name], male    DOB: 09/18/1934    MRN: [id number]   Brief patient profile:  45 yowm quit smoking 2001 bothered by doe seemed better then worse in 2010 p "lung infection" and stayed on advair/spiriva  Combo since 2012 seemed better then admitted in [facility] with pna 10/09/12  in then transferred to [facility] x 3 weeks and discharged  On May 1st  2014 referred to pulmonary clinic 11/29/12  by [facility] for copd eval and proved to have GOLD III copd 01/26/13 with symptoms much better off ACEi.    History of Present Illness 08/27/2015  Extended post hosp /transition of care  f/u ov/Garnetta Fedrick re: COPD GOLD III recurrent admits/ now chronic resp failure/HD dep CRI plus GOLD III rx symb/spriiva/neb saba Chief Complaint  Patient presents with  . Follow-up    Pt c/o occasional cough with white mucus. Pt denies wheeze/increased SOB/CP/tightness. Pt is on O2/2L, he states that on 3L it dries out his nose and burns. Pt also c/o low BP readings and wonders if some of his HTN meds need to be changed.   now sleeping in a recliner since admit 30-45 degrees due to sob/some noct cough but excess/ purulent sputum or mucus plugs   On prednisone with floor of   @ 10 mg since d/c 08/19/15  rec May need to consider adding Oracit to your regimen and I will le Dr Lorrene Reid know You will need to adjust your 02 to saturations of over 90%  Best fit and humidified 02 ordered  Plan A = Automatic = Symbicort 160 2 in am and 2 in pm with spiriva each am and prednisone 10 mg with breakfast daily  Work on inhaler technique:  Plan B = Backup Only use your albuterol (proventil) as a rescue medication Plan C = Crisis - only use your albuterol nebulizer if you first try Plan B and it fails to help > ok to use the nebulizer up to every 4 hours but if start needing it regularly call for immediate appointment   09/26/2015  f/u ov/Tishana Clinkenbeard re: COPD  GOLD  III on symbicort 160/spiriva and 02 2lpm 24/7  Chief  Complaint  Patient presents with  . Follow-up    Cough has improved some- requesting refill on tessalon.   doe/ weak x room to room  Prednisone 10 mg daily floor for now  No need for saba or neb  At this point  rec Prednisone 10 mg one on even and one-half on odd days x 2 weeks and if not change then reduce to 5 mg daily  Work on maintaining perfect inhaler technique:   Goal for 02 is 90% or above / ok adjust up  Or down or off as along as attaining this goal     11/26/2015  f/u ov/Devrin Monforte re: copd gold III/ symb/spiriva/ 02 1lpm hs and prn daytime  / still on pred 10 mg daily (maint rx since 08/19/2015 ) Chief Complaint  Patient presents with  . Follow-up    Breathing is doing well. He has not been using rescue inhaler or neb. He has not used o2 much during the day.  never tapered pred as rec "I was afraid to" rec Try prednisone 10 mg one half daily  Plan A = Automatic = symbicort and spiriva  Plan B = Backup Only use your albuterol (ventollin) as a rescue medication Plan C = Crisis - only use your albuterol  nebulizer if you first try Plan B     01/07/2016  f/u ov/Omnia Dollinger re:  Copd GOLD III/ rx 5 prednisone / symb/spiriva and rare saba  (maybe once a week)  02 hs only  Chief Complaint  Patient presents with  . Follow-up    pt states breathing is baseline since last OV. c/o sob & non prod cough.  02 1lpm hs rarely during the day / c/o dry mouth ? From spiriva dpi Doe = MMRC3 = can't walk 100 yards even at a slow pace at a flat grade s stopping due to sob   rec Plan A = Automatic = symbicort 160 Take 2 puffs first thing in am and then another 2 puffs about 12 hours later and spiriva 1.25 x 4 or 2.5 x 2puffs in am only  Plan B = Backup Only use your albuterol (ventolin) as a rescue medication  Plan C = Crisis - only use your albuterol nebulizer if you first try Plan B and it fails to help > ok to use the nebulizer up to every 4 hours but if start needing it regularly call for immediate  appointment Prednisone 5 mg daily   Please schedule a follow up visit in 3 months but call sooner if needed     04/07/2016  f/u ov/Vicie Cech re: COPD GOLD III  On symbicort 160 / one capsule spiriva /still pred 5 mg daily  ? On amiodarone  no use of 02 at all / rare saba  And doesn't carry with him anymore Very confused with meds/ doesn't recognize amiodarone as something he's ever taken Reports though feels the best he has in a long time, walking "all over the beach" on a recent trip albeit slow pace Doe = MMRC2 = can't walk a nl pace on a flat grade s sob but does fine slow and flat anywhere on RA  rec Prevnar 13 today  Only use your albuterol as a rescue medication Add needs MMSE testing also and at least bmet/ cbc/tsh/ plus esr if on amiodarone      05/06/2016 acute extended ov/Damir Leung re:   Copd flare on pred 10 one half daily insists he's not on amiodarone  Chief Complaint  Patient presents with  . Acute Visit    Pt c/o increased cough and wheezing for the past 10 days. His cough is prod with yellow sputum.    onset was 10 days prior to OV  Onset with sore throat ? Has flutter/ very confused again re details of care   ok at rest after prn saba / maint in spriva dpi/ symb 160 2bid    No obvious day to day or daytime variabilty or assoc excess/ purulent sputum or mucus plugs    cp or chest tightness, subjective wheeze overt sinus or hb symptoms. No unusual exp hx or h/o childhood pna/ asthma or knowledge of premature birth.   Sleeping ok without nocturnal  or early am exacerbation  of respiratory  c/o's or need for noct saba. Also denies any obvious fluctuation of symptoms with weather or environmental changes or other aggravating or alleviating factors except as outlined above   Current Medications, Allergies, Complete Past Medical History, Past Surgical History, Family History, and Social History were reviewed in [employer] record.  ROS  The following are not  active complaints unless bolded sore throat, dysphagia, dental problems, itching, sneezing,  nasal congestion or excess/ purulent secretions, ear ache,   fever, chills, sweats, unintended wt  loss, pleuritic or exertional cp, hemoptysis,  orthopnea pnd or leg swelling, presyncope, palpitations, heartburn, abdominal pain, anorexia, nausea, vomiting, diarrhea  or change in bowel or urinary habits, change in stools or urine, dysuria,hematuria,  rash, arthralgias, visual complaints, headache, numbness weakness improving or ataxia or problems with walking or coordination,  change in mood/affect or memory.           Objective:   Physical Exam  Somber amb wm nad    02/26/2015          210 >  08/27/2015 163 p HD > 09/26/2015  159 > 11/26/2015  160 > 01/07/2016  159 > 04/07/2016  159 > 05/06/2016     10/22/14 202 lb (91.627 kg)  08/28/14 203 lb 8 oz (92.307 kg)  08/20/14 205 lb 6.4 oz (93.169 kg)    Vital signs reviewed   - - Note on arrival 02 sats  86% on RA    HEENT: nl dentition, turbinates, and oropharynx. Nl external ear canals without cough reflex   NECK :  without JVD/Nodes/TM/ nl carotid upstrokes bilaterally   LUNGS: no acc muscle use,  Barrel chest, moderate, with increased Texp but no wheeze or rhonchi   CV:  RRR  no s3 or murmur or increase in P2, no edema   ABD:  soft and nontender with nl inspiratory excursion in the supine position. No bruits or organomegaly, bowel sounds nl  MS:  Nl gait/ ext warm without deformities, calf tenderness, cyanosis or clubbing No obvious joint restrictions   SKIN: warm and dry without lesions    NEURO:  alert, approp, nl sensorium with  no motor deficits       CXR PA and Lateral:   05/06/2016 :    I personally reviewed images and agree with radiology impression as follows:   COPD with chronic bibasilar atelectasis/ scarring. No change from prior studies.       Assessment & Plan:

## 2016-05-06 NOTE — Patient Instructions (Addendum)
Goal is to keep 02 sats above 90's   Prednisone 10 mg take  4 each am x 2 days,   2 each am x 2 days,  1 each am x 2 days and then resume previous   zpak   For cough > mucinex up to 1200 mg every 12 hours and use the flutter valve as much as you can  Please remember to go to the  x-ray department downstairs for your tests - we will call you with the results when they are available.     Keep appointment with Tammy as per previous instructions - BRING all medications including devices (not the nebulizer)

## 2016-05-07 ENCOUNTER — Encounter: Payer: Self-pay | Admitting: Adult Health

## 2016-05-07 ENCOUNTER — Ambulatory Visit (INDEPENDENT_AMBULATORY_CARE_PROVIDER_SITE_OTHER): Payer: Medicare PPO | Admitting: Adult Health

## 2016-05-07 DIAGNOSIS — J449 Chronic obstructive pulmonary disease, unspecified: Secondary | ICD-10-CM | POA: Diagnosis not present

## 2016-05-07 MED ORDER — PREDNISONE 10 MG PO TABS: ORAL_TABLET | ORAL | 0 refills | Status: DC

## 2016-05-07 NOTE — Patient Instructions (Signed)
Follow med calendar closely and bring to each visit.  Taper prednisone as below : 4 tabs for 2 days, 2 tabs daily for  2 days, then 1 tab daily for 2 days , then 1/2 tab daily .  follow up Dr. Melvyn Novas  In 3 months and As needed

## 2016-05-07 NOTE — Assessment & Plan Note (Signed)
Recent flare now resolving  Patient's medications were reviewed today and patient education was given. Computerized medication calendar was adjusted/completed   Plan  Patient Instructions  Follow med calendar closely and bring to each visit.  Taper prednisone as below : 4 tabs for 2 days, 2 tabs daily for  2 days, then 1 tab daily for 2 days , then 1/2 tab daily .  follow up Dr. Melvyn Novas  In 3 months and As needed

## 2016-05-07 NOTE — Progress Notes (Addendum)
Subjective:    Patient ID: Terry [name], male    DOB: 12/22/1934    MRN: [id number]   Brief patient profile:  85 yowm quit smoking 2001 bothered by doe seemed better then worse in 2010 p "lung infection" and stayed on advair/spiriva  Combo since 2012 seemed better then admitted in [facility] with pna 10/09/12  in then transferred to [facility] NH x 3 weeks and discharged  On May 1st  2014 referred to pulmonary clinic 11/29/12  by [facility] for copd eval and proved to have GOLD III copd 01/26/13 with symptoms much better off ACEi.    History of Present Illness 08/27/2015  Extended post hosp /transition of care  f/u ov/Wert re: COPD GOLD III recurrent admits/ now chronic resp failure/HD dep CRI plus GOLD III rx symb/spriiva/neb saba Chief Complaint  Patient presents with  . Follow-up    Pt c/o occasional cough with white mucus. Pt denies wheeze/increased SOB/CP/tightness. Pt is on O2/2L, he states that on 3L it dries out his nose and burns. Pt also c/o low BP readings and wonders if some of his HTN meds need to be changed.   now sleeping in a recliner since admit 30-45 degrees due to sob/some noct cough but excess/ purulent sputum or mucus plugs   On prednisone with floor of   @ 10 mg since d/c 08/19/15  rec May need to consider adding Oracit to your regimen and I will le Dr Lorrene Reid know You will need to adjust your 02 to saturations of over 90%  Best fit and humidified 02 ordered  Plan A = Automatic = Symbicort 160 2 in am and 2 in pm with spiriva each am and prednisone 10 mg with breakfast daily  Work on inhaler technique:  Plan B = Backup Only use your albuterol (proventil) as a rescue medication Plan C = Crisis - only use your albuterol nebulizer if you first try Plan B and it fails to help > ok to use the nebulizer up to every 4 hours but if start needing it regularly call for immediate appointment   09/26/2015  f/u ov/Wert re: COPD  GOLD  III on symbicort 160/spiriva and 02 2lpm 24/7  Chief  Complaint  Patient presents with  . Follow-up    Cough has improved some- requesting refill on tessalon.   doe/ weak x room to room  Prednisone 10 mg daily floor for now  No need for saba or neb  At this point  rec Prednisone 10 mg one on even and one-half on odd days x 2 weeks and if not change then reduce to 5 mg daily  Work on maintaining perfect inhaler technique:   Goal for 02 is 90% or above / ok adjust up  Or down or off as along as attaining this goal     11/26/2015  f/u ov/Wert re: copd gold III/ symb/spiriva/ 02 1lpm hs and prn daytime  / still on pred 10 mg daily (maint rx since 08/19/2015 ) Chief Complaint  Patient presents with  . Follow-up    Breathing is doing well. He has not been using rescue inhaler or neb. He has not used o2 much during the day.  never tapered pred as rec "I was afraid to" rec Try prednisone 10 mg one half daily  Plan A = Automatic = symbicort and spiriva  Plan B = Backup Only use your albuterol (ventollin) as a rescue medication Plan C = Crisis - only use your albuterol  nebulizer if you first try Plan B     01/07/2016  f/u ov/Wert re:  Copd GOLD III/ rx 5 prednisone / symb/spiriva and rare saba  (maybe once a week)  02 hs only  Chief Complaint  Patient presents with  . Follow-up    pt states breathing is baseline since last OV. c/o sob & non prod cough.  02 1lpm hs rarely during the day / c/o dry mouth ? From spiriva dpi Doe = MMRC3 = can't walk 100 yards even at a slow pace at a flat grade s stopping due to sob   rec Plan A = Automatic = symbicort 160 Take 2 puffs first thing in am and then another 2 puffs about 12 hours later and spiriva 1.25 x 4 or 2.5 x 2puffs in am only  Plan B = Backup Only use your albuterol (ventolin) as a rescue medication  Plan C = Crisis - only use your albuterol nebulizer if you first try Plan B and it fails to help > ok to use the nebulizer up to every 4 hours but if start needing it regularly call for immediate  appointment Prednisone 5 mg daily   Please schedule a follow up visit in 3 months but call sooner if needed     04/07/2016  f/u ov/Wert re: COPD GOLD III  On symbicort 160 / one capsule spiriva /still pred 5 mg daily  ? On amiodarone  no use of 02 at all / rare saba  And doesn't carry with him anymore Very confused with meds/ doesn't recognize amiodarone as something he's ever taken Reports though feels the best he has in a long time, walking "all over the beach" on a recent trip albeit slow pace Doe = MMRC2 = can't walk a nl pace on a flat grade s sob but does fine slow and flat anywhere on RA  rec Prevnar 13 today  Only use your albuterol as a rescue medication Add needs MMSE testing also and at least bmet/ cbc/tsh/ plus esr if on amiodarone      05/06/2016 acute extended ov/Wert re:   Copd flare on pred 10 one half daily insists he's not on amiodarone  Chief Complaint  Patient presents with  . Acute Visit    Pt c/o increased cough and wheezing for the past 10 days. His cough is prod with yellow sputum.    onset was 10 days prior to OV  Onset with sore throat ? Has flutter/ very confused again re details of care   >>zpack / pred     05/07/2016 Follow up : COPD  Pt returns for follow up for med review. We reviewed all his medications organize them into a medication count with patient education. Several medications are not brought today. However, patient says he has them at home. She was seen yesterday for COPD exacerbation, started on a Z-Pak and prednisone burst. Patient has started Z-Pak and says that he is starting to feel better. Unfortunately he did not the prednisone burst. He says he needs extra prednisone sent to the pharmacy. Patient denies any hemoptysis, chest pain, orthopnea, PND, or increased leg swelling. He says he does feel better today with decreased cough and congestion.  Current Medications, Allergies, Complete Past Medical History, Past Surgical History, Family  History, and Social History were reviewed in [employer] record.  ROS  The following are not active complaints unless bolded sore throat, dysphagia, dental problems, itching, sneezing,  nasal congestion or  excess/ purulent secretions, ear ache,   fever, chills, sweats, unintended wt loss, pleuritic or exertional cp, hemoptysis,  orthopnea pnd or leg swelling, presyncope, palpitations, heartburn, abdominal pain, anorexia, nausea, vomiting, diarrhea  or change in bowel or urinary habits, change in stools or urine, dysuria,hematuria,  rash, arthralgias, visual complaints, headache, numbness weakness improving or ataxia or problems with walking or coordination,  change in mood/affect or memory.           Objective:   Physical Exam  Somber amb wm nad    02/26/2015          210 >  08/27/2015 163 p HD > 09/26/2015  159 > 11/26/2015  160 > 01/07/2016  159 > 04/07/2016  159 > 05/06/2016   Vitals:   05/07/16 1551  BP: 126/74  Pulse: 81  SpO2: 93%     Vital signs reviewed   - - Note on arrival O2 sats 93% on 1l/m O2 .   HEENT: nl dentition, turbinates, and oropharynx. Nl external ear canals without cough reflex   NECK :  without JVD/Nodes/TM/ nl carotid upstrokes bilaterally   LUNGS: no acc muscle use,  Barrel chest, decreased BS in bases   CV:  RRR  no s3 or murmur or increase in P2, no edema   ABD:  soft and nontender with nl inspiratory excursion in the supine position. No bruits or organomegaly, bowel sounds nl  MS:  Nl gait/ ext warm without deformities, calf tenderness, cyanosis or clubbing No obvious joint restrictions   SKIN: warm and dry without lesions    NEURO:  alert, approp, nl sensorium with  no motor deficits       CXR:  05/06/16 COPD changes   Lynelle Smoke Parrett NP-C   Pulmonary and Critical Care  05/07/2016

## 2016-05-08 ENCOUNTER — Telehealth: Payer: Self-pay | Admitting: *Deleted

## 2016-05-08 DIAGNOSIS — N186 End stage renal disease: Secondary | ICD-10-CM | POA: Diagnosis not present

## 2016-05-08 DIAGNOSIS — J156 Pneumonia due to other aerobic Gram-negative bacteria: Secondary | ICD-10-CM | POA: Diagnosis not present

## 2016-05-08 DIAGNOSIS — J150 Pneumonia due to Klebsiella pneumoniae: Secondary | ICD-10-CM | POA: Diagnosis not present

## 2016-05-08 NOTE — Telephone Encounter (Signed)
Spoke with the pt and notified of recs  He states that he did bring all of his meds except for some that he left in the car  He refused to make sooner appt and states the med cal was done right and he does not want to come back so soon  He is upset b/c he has to drive 30 miles here and back  I advised will let MW know his response

## 2016-05-08 NOTE — Telephone Encounter (Signed)
Spoke with the pt  He states to call him back later since he is at Dialysis right now  Terry [name] [facility] hold in my basket and call him back later

## 2016-05-08 NOTE — Progress Notes (Signed)
Chart and office note reviewed in detail  > agree with a/p as outlined    

## 2016-05-08 NOTE — Telephone Encounter (Signed)
He will be in town 05/29/16 - make appt all meds and med calendar

## 2016-05-08 NOTE — Telephone Encounter (Signed)
-----   Message from Tanda Rockers, MD sent at 05/08/2016  6:11 AM EST ----- Tammy reported he did not bring all meds with him to ov so I'm very concerned we're still not all on the same page, needs ov with me in 2 weeks with all meds and med calendar to make sure they match up.  If he is not able to comply with this rec, I will  ask him so see another doctor - convert this to a phone note please

## 2016-05-10 DIAGNOSIS — N186 End stage renal disease: Secondary | ICD-10-CM | POA: Diagnosis not present

## 2016-05-10 DIAGNOSIS — J156 Pneumonia due to other aerobic Gram-negative bacteria: Secondary | ICD-10-CM | POA: Diagnosis not present

## 2016-05-10 DIAGNOSIS — J150 Pneumonia due to Klebsiella pneumoniae: Secondary | ICD-10-CM | POA: Diagnosis not present

## 2016-05-11 ENCOUNTER — Encounter: Payer: Self-pay | Admitting: Internal Medicine

## 2016-05-11 NOTE — Assessment & Plan Note (Addendum)
-   Trial off ACEi 11/30/2012 >>> much better symptom control - 05/28/2014   Walked RA x one lap @ 185 stopped due to  [employer] / sats ok/ mod pace - PFT's 01/26/13      FEV1  1.28 (48%) ratio 48 and no better p B2,  DLCO 39 corrects to 48%  - PFTs 02/26/2015   FEV1 0.99  (35 % ) ratio 35  p 11 % improvement from saba with DLCO  24 % corrects to 35  % for alv volume  p am symb/spiriva    - Pred maint rx with floor of 10 mg per day at d/c 08/14/15  - 09/26/2015  extensive coaching HFA effectiveness =    75%  - 11/26/2015 rec reduce pred to 5 mg daily    - 01/07/2016  After extensive coaching HFA effectiveness =  90% with respimat so changed spiriva to respimat  And maintain pred 5 mg daily   Apparent flare of copd in setting of viral uri > rec zpak/ 6 day pred burst then back to 5 mg daily for now  I had an extended discussion with the patient/wife  reviewing all relevant studies completed to date and  lasting 15 to 20 minutes of a 25 minute visit on the following ongoing concerns:   Still unclear to me what meds he's taking and going forward if he wants other than emergency care he'll need to bring all active meds to all ov's to sort out. Further,  To keep things simple, I have asked the patient to first separate medicines that are perceived as maintenance, that is to be taken daily "no matter what", from those medicines that are taken on only on an as-needed basis and I have given the patient examples of both, and then return to see our NP to generate a  detailed  medication calendar which should be followed until the next physician sees the patient and updates it.    Advised if not able to meet me half way regarding med reconciliation then I would be withdrawing from his care.

## 2016-05-11 NOTE — Assessment & Plan Note (Signed)
02 rx 2.5  lpm 24/7 as of d/c 08/14/15   As of 04/07/2016 =  Only using  Prn > advised either wear the 02 as rec or monitor sats with target of > 90% at all times

## 2016-05-12 DIAGNOSIS — N186 End stage renal disease: Secondary | ICD-10-CM | POA: Diagnosis not present

## 2016-05-12 DIAGNOSIS — J150 Pneumonia due to Klebsiella pneumoniae: Secondary | ICD-10-CM | POA: Diagnosis not present

## 2016-05-12 DIAGNOSIS — J156 Pneumonia due to other aerobic Gram-negative bacteria: Secondary | ICD-10-CM | POA: Diagnosis not present

## 2016-05-13 NOTE — Telephone Encounter (Signed)
Pt called back and has scheduled ov with MW for 05/22/17

## 2016-05-15 DIAGNOSIS — N186 End stage renal disease: Secondary | ICD-10-CM | POA: Diagnosis not present

## 2016-05-15 DIAGNOSIS — Z992 Dependence on renal dialysis: Secondary | ICD-10-CM | POA: Diagnosis not present

## 2016-05-17 ENCOUNTER — Other Ambulatory Visit: Payer: Self-pay | Admitting: Internal Medicine

## 2016-05-17 DIAGNOSIS — M6281 Muscle weakness (generalized): Secondary | ICD-10-CM | POA: Diagnosis not present

## 2016-05-17 DIAGNOSIS — I5031 Acute diastolic (congestive) heart failure: Secondary | ICD-10-CM | POA: Diagnosis not present

## 2016-05-17 DIAGNOSIS — R2681 Unsteadiness on feet: Secondary | ICD-10-CM | POA: Diagnosis not present

## 2016-05-17 DIAGNOSIS — Z992 Dependence on renal dialysis: Secondary | ICD-10-CM | POA: Diagnosis not present

## 2016-05-17 DIAGNOSIS — K922 Gastrointestinal hemorrhage, unspecified: Secondary | ICD-10-CM | POA: Diagnosis not present

## 2016-05-17 DIAGNOSIS — D500 Iron deficiency anemia secondary to blood loss (chronic): Secondary | ICD-10-CM | POA: Diagnosis not present

## 2016-05-17 DIAGNOSIS — J449 Chronic obstructive pulmonary disease, unspecified: Secondary | ICD-10-CM | POA: Diagnosis not present

## 2016-05-17 DIAGNOSIS — L0390 Cellulitis, unspecified: Secondary | ICD-10-CM | POA: Diagnosis not present

## 2016-05-17 DIAGNOSIS — I2510 Atherosclerotic heart disease of native coronary artery without angina pectoris: Secondary | ICD-10-CM | POA: Diagnosis not present

## 2016-05-18 DIAGNOSIS — J156 Pneumonia due to other aerobic Gram-negative bacteria: Secondary | ICD-10-CM | POA: Diagnosis not present

## 2016-05-18 DIAGNOSIS — J150 Pneumonia due to Klebsiella pneumoniae: Secondary | ICD-10-CM | POA: Diagnosis not present

## 2016-05-18 DIAGNOSIS — N186 End stage renal disease: Secondary | ICD-10-CM | POA: Diagnosis not present

## 2016-05-18 NOTE — Addendum Note (Signed)
Addended by: Doroteo Glassman D on: 05/18/2016 01:51 PM   Modules accepted: Orders

## 2016-05-20 DIAGNOSIS — J156 Pneumonia due to other aerobic Gram-negative bacteria: Secondary | ICD-10-CM | POA: Diagnosis not present

## 2016-05-20 DIAGNOSIS — J150 Pneumonia due to Klebsiella pneumoniae: Secondary | ICD-10-CM | POA: Diagnosis not present

## 2016-05-20 DIAGNOSIS — N186 End stage renal disease: Secondary | ICD-10-CM | POA: Diagnosis not present

## 2016-05-21 ENCOUNTER — Ambulatory Visit (INDEPENDENT_AMBULATORY_CARE_PROVIDER_SITE_OTHER): Payer: Medicare PPO | Admitting: Podiatry

## 2016-05-21 ENCOUNTER — Encounter: Payer: Self-pay | Admitting: Podiatry

## 2016-05-21 VITALS — Ht 68.0 in | Wt 157.0 lb

## 2016-05-21 DIAGNOSIS — B351 Tinea unguium: Secondary | ICD-10-CM | POA: Diagnosis not present

## 2016-05-21 DIAGNOSIS — Z992 Dependence on renal dialysis: Secondary | ICD-10-CM | POA: Diagnosis not present

## 2016-05-21 DIAGNOSIS — M79676 Pain in unspecified toe(s): Secondary | ICD-10-CM

## 2016-05-21 DIAGNOSIS — N186 End stage renal disease: Secondary | ICD-10-CM | POA: Diagnosis not present

## 2016-05-21 DIAGNOSIS — E1122 Type 2 diabetes mellitus with diabetic chronic kidney disease: Secondary | ICD-10-CM | POA: Diagnosis not present

## 2016-05-21 NOTE — Progress Notes (Signed)
   Subjective:    Patient ID: Terry [name], male    DOB: February 05, 1935, 81 y.o.   MRN: [id number]  HPI this patient presents to my office with  complaint of long thick painful nails.  Nails are painful walking and wearing his shoes. He states he is unable to self treat. Patient does give a history of a back injury which causes numbness into the toes on both feet. He also describes having significant kidney failure as well as heart disease. He presents the office today for definitive evaluation and treatment of his nails.    Review of Systems  All other systems reviewed and are negative.      Objective:   Physical Exam GENERAL APPEARANCE: Alert, conversant. Appropriately groomed. No acute distress.  VASCULAR: Pedal pulses are  palpable at  [facility] and PT bilateral.  Capillary refill time is immediate to all digits,  Normal temperature gradient.   NEUROLOGIC: sensation is normal to 5.07 monofilament at 5/5 sites bilateral.  Light touch is intact bilateral, Muscle strength normal.  MUSCULOSKELETAL: acceptable muscle strength, tone and stability bilateral.  Intrinsic muscluature intact bilateral.  Rectus appearance of foot and digits noted bilateral.   DERMATOLOGIC: skin color, texture, and turgor are within normal limits.  No preulcerative lesions or ulcers  are seen, no interdigital maceration noted.  No open lesions present.   No drainage noted.  NAILS Thick disfigured discolored nails both feet.  Healing noted hallux following his previous surgery..         Assessment & Plan:  Onychomycosis  B/L    IE  Debridement of elongated ingrown toenails B/l  RTC 10 weeks.Gardiner Barefoot DPM

## 2016-05-22 ENCOUNTER — Ambulatory Visit (INDEPENDENT_AMBULATORY_CARE_PROVIDER_SITE_OTHER): Payer: Medicare PPO | Admitting: Internal Medicine

## 2016-05-22 ENCOUNTER — Encounter: Payer: Self-pay | Admitting: Internal Medicine

## 2016-05-22 VITALS — BP 150/60 | HR 67 | Ht 68.0 in | Wt 156.0 lb

## 2016-05-22 DIAGNOSIS — J150 Pneumonia due to Klebsiella pneumoniae: Secondary | ICD-10-CM | POA: Diagnosis not present

## 2016-05-22 DIAGNOSIS — N186 End stage renal disease: Secondary | ICD-10-CM | POA: Diagnosis not present

## 2016-05-22 DIAGNOSIS — J156 Pneumonia due to other aerobic Gram-negative bacteria: Secondary | ICD-10-CM | POA: Diagnosis not present

## 2016-05-22 DIAGNOSIS — J449 Chronic obstructive pulmonary disease, unspecified: Secondary | ICD-10-CM | POA: Diagnosis not present

## 2016-05-22 DIAGNOSIS — J9611 Chronic respiratory failure with hypoxia: Secondary | ICD-10-CM | POA: Diagnosis not present

## 2016-05-22 NOTE — Progress Notes (Signed)
Subjective:    Patient ID: Terry [name], male    DOB: July 04, 1934    MRN: [id number]   Brief patient profile:  68 yowm quit smoking 2001 bothered by doe seemed better then worse in 2010 p "lung infection" and stayed on advair/spiriva  Combo since 2012 seemed better then admitted in [facility] [address] with pna 10/09/12  in then transferred to [facility] x 3 weeks and discharged  On May 1st  2014 referred to pulmonary clinic 11/29/12  by [facility] for copd eval and proved to have GOLD III copd 01/26/13 with symptoms much better off ACEi.    History of Present Illness 08/27/2015  Extended post hosp /transition of care  f/u ov/Gay Moncivais re: COPD GOLD III recurrent admits/ now chronic resp failure/HD dep CRI plus GOLD III rx symb/spriiva/neb saba Chief Complaint  Patient presents with  . Follow-up    Pt c/o occasional cough with white mucus. Pt denies wheeze/increased SOB/CP/tightness. Pt is on O2/2L, he states that on 3L it dries out his nose and burns. Pt also c/o low BP readings and wonders if some of his HTN meds need to be changed.   now sleeping in a recliner since admit 30-45 degrees due to sob/some noct cough but excess/ purulent sputum or mucus plugs   On prednisone with floor of   @ 10 mg since d/c 08/19/15  rec May need to consider adding Oracit to your regimen and I will le Dr Lorrene Reid know You will need to adjust your 02 to saturations of over 90%  Best fit and humidified 02 ordered  Plan A = Automatic = Symbicort 160 2 in am and 2 in pm with spiriva each am and prednisone 10 mg with breakfast daily  Work on inhaler technique:  Plan B = Backup Only use your albuterol (proventil) as a rescue medication Plan C = Crisis - only use your albuterol nebulizer if you first try Plan B and it fails to help > ok to use the nebulizer up to every 4 hours but if start needing it regularly call for immediate appointment   09/26/2015  f/u ov/Demia Viera re: COPD  GOLD  III on symbicort 160/spiriva and 02 2lpm 24/7  Chief  Complaint  Patient presents with  . Follow-up    Cough has improved some- requesting refill on tessalon.   doe/ weak x room to room  Prednisone 10 mg daily floor for now  No need for saba or neb  At this point  rec Prednisone 10 mg one on even and one-half on odd days x 2 weeks and if not change then reduce to 5 mg daily  Work on maintaining perfect inhaler technique:   Goal for 02 is 90% or above / ok adjust up  Or down or off as along as attaining this goal      01/07/2016  f/u ov/Rodnesha Elie re:  Copd GOLD III/ rx 5 prednisone / symb/spiriva and rare saba  (maybe once a week)  02 hs only  Chief Complaint  Patient presents with  . Follow-up    pt states breathing is baseline since last OV. c/o sob & non prod cough.  02 1lpm hs rarely during the day / c/o dry mouth ? From spiriva dpi Doe = MMRC3 = can't walk 100 yards even at a slow pace at a flat grade s stopping due to sob   rec Plan A = Automatic = symbicort 160 Take 2 puffs first thing in am and then another  2 puffs about 12 hours later and spiriva 1.25 x 4 or 2.5 x 2puffs in am only  Plan B = Backup Only use your albuterol (ventolin) as a rescue medication  Plan C = Crisis - only use your albuterol nebulizer if you first try Plan B and it fails to help > ok to use the nebulizer up to every 4 hours but if start needing it regularly call for immediate appointment Prednisone 5 mg daily   Please schedule a follow up visit in 3 months but call sooner if needed        05/06/2016 acute extended ov/Jalilah Wiltsie re:   Copd flare on pred 10 one half daily insists he's not on amiodarone  Chief Complaint  Patient presents with  . Acute Visit    Pt c/o increased cough and wheezing for the past 10 days. His cough is prod with yellow sputum.    onset was 10 days prior to OV  Onset with sore throat ? Has flutter/ very confused again re details of care   >>zpack / pred     05/07/2016 NP Follow up : COPD  Pt returns for follow up for med review. We  reviewed all his medications organize them into a medication count with patient education. Several medications are not brought today. However, patient says he has them at home. was seen 05/06/16 for COPD exacerbation, started on a Z-Pak and prednisone burst. Patient has started Z-Pak and says that he is starting to feel better. Unfortunately he did not start the prednisone   rec Follow med calendar closely and bring to each visit.  Taper prednisone as below : 4 tabs for 2 days, 2 tabs daily for  2 days, then 1 tab daily for 2 days , then 1/2 tab daily .     05/22/2016  f/u ov/Joelie Schou re: COPD GOLDI III/ steroid dep / 02 dep/ has med cal with multiple erroneous edits he's made with wrong meds in wrong columns and note did not bring all meds in as requested for med reconciliation/ med calendar Chief Complaint  Patient presents with  . Follow-up    Medication review.   does HD MWF  On 02 24/7  1lpm  on conc and 2lpm on POC  Some cough/ nasal and chest congestion improved with pred burst and taper to 5 mg daily Min need for saba on sym/spiriva    No obvious day to day or daytime variability or assoc excess/ purulent sputum or mucus plugs or hemoptysis or cp or chest tightness, subjective wheeze or overt sinus or hb symptoms. No unusual exp hx or h/o childhood pna/ asthma or knowledge of premature birth.  Sleeping ok without nocturnal  or early am exacerbation  of respiratory  c/o's or need for noct saba. Also denies any obvious fluctuation of symptoms with weather or environmental changes or other aggravating or alleviating factors except as outlined above   Current Medications, Allergies, Complete Past Medical History, Past Surgical History, Family History, and Social History were reviewed in [employer] record.  ROS  The following are not active complaints unless bolded sore throat, dysphagia, dental problems, itching, sneezing,  nasal congestion or excess/ purulent  secretions, ear ache,   fever, chills, sweats, unintended wt loss, classically pleuritic or exertional cp,  orthopnea pnd or leg swelling, presyncope, palpitations, abdominal pain, anorexia, nausea, vomiting, diarrhea  or change in bowel or bladder habits, change in stools or urine, dysuria,hematuria,  rash, arthralgias, visual complaints, headache,  numbness, weakness or ataxia or problems with walking or coordination,  change in mood/affect or memory.            Objective:   Physical Exam  Somber amb wm nad    02/26/2015          210 >  08/27/2015 163 p HD > 09/26/2015  159 > 11/26/2015  160 > 01/07/2016  159 > 04/07/2016  159 > 05/06/2016> 05/22/2016      Vital signs reviewed   - Note on arrival 02 sats  94% on 2lpm     HEENT: nl dentition, turbinates, and oropharynx. Nl external ear canals without cough reflex   NECK :  without JVD/Nodes/TM/ nl carotid upstrokes bilaterally   LUNGS: no acc muscle use,  Barrel chest, decreased BS in bases   CV:  RRR  no s3 or murmur or increase in P2, no edema   ABD:  soft and nontender with nl inspiratory excursion in the supine position. No bruits or organomegaly, bowel sounds nl  MS:  Nl gait/ ext warm without deformities, calf tenderness, cyanosis or clubbing No obvious joint restrictions   SKIN: warm and dry without lesions    NEURO:  alert, approp, nl sensorium with  no motor deficits      I personally reviewed images and agree with radiology impression as follows:  CXR:   05/06/16 COPD with chronic bibasilar atelectasis/ scarring. No change from prior studies.

## 2016-05-22 NOTE — Patient Instructions (Addendum)
Ok for stuffy nose to use nasal saline as needed as per med calendar  Work on perfecting  inhaler technique:  relax and gently blow all the way out then take a nice smooth deep breath back in, triggering the inhaler at same time you start breathing in.  Hold for up to 5 seconds if you can. Blow out thru nose. Rinse and gargle with water when done     See calendar for specific medication instructions and bring it back for each and every office visit for every healthcare provider you see.  Without it,  you may not receive the best quality medical care that we feel you deserve.  You will note that the calendar groups together  your maintenance  medications that are timed at particular times of the day.  Think of this as your checklist for what your doctor has instructed you to do until your next evaluation to see what benefit  there is  to staying on a consistent group of medications intended to keep you well.  The other group at the bottom is entirely up to you to use as you see fit  for specific symptoms that may arise between visits that require you to treat them on an as needed basis.  Think of this as your action plan or "what if" list.   Separating the top medications from the bottom group is fundamental to providing you adequate care going forward.    Please schedule a follow up office visit in 6 weeks, call sooner if needed - all you need is your med calendar  Note ? On duoneb and spriva?

## 2016-05-24 NOTE — Assessment & Plan Note (Signed)
02 rx as of  d/c 08/14/15   As of 05/22/2016 =  1lpm at home/ 2lpm POC  > Adequate control on present rx, reviewed in detail with pt > no change in rx needed

## 2016-05-24 NOTE — Assessment & Plan Note (Signed)
-   Trial off ACEi 11/30/2012 >>> much better symptom control - 05/28/2014   Walked RA x one lap @ 185 stopped due to  [employer] / sats ok/ mod pace - PFT's 01/26/13      FEV1  1.28 (48%) ratio 48 and no better p B2,  DLCO 39 corrects to 48%  - PFTs 02/26/2015   FEV1 0.99  (35 % ) ratio 35  p 11 % improvement from saba with DLCO  24 % corrects to 35  % for alv volume  p am symb/spiriva    - Pred maint rx  Since d/c 08/14/15  - 09/26/2015  extensive coaching HFA effectiveness =    75%  - 11/26/2015 rec reduce pred to 5 mg daily    - 05/22/2016  After extensive coaching HFA effectiveness =  90%   He has severe copd but is well compensated on present rx/ albeit very complex and reflected on his med calendar which he still fails to use correctly  I had an extended discussion with the patient reviewing all relevant studies completed to date and  lasting 15 to 20 minutes of a 25 minute visit    Each maintenance medication was reviewed in detail including most importantly the difference between maintenance and prns and under what circumstances the prns are to be triggered using an action plan format that is not reflected in the computer generated alphabetically organized AVS but trather by a customized med calendar that reflects the AVS meds with confirmed 100% correlation.   In addition, Please see AVS for unique instructions that I personally wrote and verbalized to the the pt in detail and then reviewed with pt  by my nurse highlighting any  changes in therapy recommended at today's visit to their plan of care.

## 2016-05-25 DIAGNOSIS — J156 Pneumonia due to other aerobic Gram-negative bacteria: Secondary | ICD-10-CM | POA: Diagnosis not present

## 2016-05-25 DIAGNOSIS — N186 End stage renal disease: Secondary | ICD-10-CM | POA: Diagnosis not present

## 2016-05-25 DIAGNOSIS — J150 Pneumonia due to Klebsiella pneumoniae: Secondary | ICD-10-CM | POA: Diagnosis not present

## 2016-05-27 DIAGNOSIS — J156 Pneumonia due to other aerobic Gram-negative bacteria: Secondary | ICD-10-CM | POA: Diagnosis not present

## 2016-05-27 DIAGNOSIS — J150 Pneumonia due to Klebsiella pneumoniae: Secondary | ICD-10-CM | POA: Diagnosis not present

## 2016-05-27 DIAGNOSIS — N186 End stage renal disease: Secondary | ICD-10-CM | POA: Diagnosis not present

## 2016-05-28 ENCOUNTER — Other Ambulatory Visit: Payer: Medicare PPO | Admitting: *Deleted

## 2016-05-28 DIAGNOSIS — I5032 Chronic diastolic (congestive) heart failure: Secondary | ICD-10-CM | POA: Diagnosis not present

## 2016-05-28 LAB — TSH: TSH: 4.77 mIU/L — ABNORMAL HIGH (ref 0.40–4.50)

## 2016-05-28 LAB — T4: T4 TOTAL: 5.2 ug/dL (ref 4.5–12.0)

## 2016-05-29 ENCOUNTER — Telehealth: Payer: Self-pay | Admitting: *Deleted

## 2016-05-29 ENCOUNTER — Other Ambulatory Visit: Payer: Medicare PPO

## 2016-05-29 DIAGNOSIS — N186 End stage renal disease: Secondary | ICD-10-CM | POA: Diagnosis not present

## 2016-05-29 DIAGNOSIS — J156 Pneumonia due to other aerobic Gram-negative bacteria: Secondary | ICD-10-CM | POA: Diagnosis not present

## 2016-05-29 DIAGNOSIS — J150 Pneumonia due to Klebsiella pneumoniae: Secondary | ICD-10-CM | POA: Diagnosis not present

## 2016-05-29 LAB — T3: T3, Total: 85 ng/dL (ref 76–181)

## 2016-05-29 NOTE — Telephone Encounter (Signed)
I spoke with pt and reviewed thyroid lab results with him. He reports BP has been running high.  He is currently going into dialysis.  I asked pt to call us or send [place] recent blood pressure readings from home and also readings taken at dialysis.

## 2016-06-01 DIAGNOSIS — J156 Pneumonia due to other aerobic Gram-negative bacteria: Secondary | ICD-10-CM | POA: Diagnosis not present

## 2016-06-01 DIAGNOSIS — N186 End stage renal disease: Secondary | ICD-10-CM | POA: Diagnosis not present

## 2016-06-01 DIAGNOSIS — J150 Pneumonia due to Klebsiella pneumoniae: Secondary | ICD-10-CM | POA: Diagnosis not present

## 2016-06-03 DIAGNOSIS — J156 Pneumonia due to other aerobic Gram-negative bacteria: Secondary | ICD-10-CM | POA: Diagnosis not present

## 2016-06-03 DIAGNOSIS — J449 Chronic obstructive pulmonary disease, unspecified: Secondary | ICD-10-CM | POA: Diagnosis not present

## 2016-06-03 DIAGNOSIS — J150 Pneumonia due to Klebsiella pneumoniae: Secondary | ICD-10-CM | POA: Diagnosis not present

## 2016-06-03 DIAGNOSIS — N186 End stage renal disease: Secondary | ICD-10-CM | POA: Diagnosis not present

## 2016-06-03 NOTE — Telephone Encounter (Signed)
I spoke with pt's wife and they haven't checked BP the last few days.  They will start keeping record and call us next week with readings.

## 2016-06-04 ENCOUNTER — Ambulatory Visit (INDEPENDENT_AMBULATORY_CARE_PROVIDER_SITE_OTHER): Payer: Medicare PPO

## 2016-06-04 VITALS — BP 170/70 | HR 78 | Ht 67.0 in | Wt 156.0 lb

## 2016-06-04 DIAGNOSIS — Z0000 Encounter for general adult medical examination without abnormal findings: Secondary | ICD-10-CM

## 2016-06-04 NOTE — Progress Notes (Signed)
Subjective:   Terry [name] is a 81 y.o. male who presents for Medicare Annual/Subsequent preventive examination.  The Patient was informed that the wellness visit is to identify future health risk and educate and initiate measures that can reduce risk for increased disease through the lifespan.    NO ROS; Medicare Wellness Visit   States he is on dialysis since Nov 2016  Dr Maudie Mercury sent to [facility] and found his kidneys were failing; then hospitalized due to ruptured kidney/ followed by pneumonia Had copd exacerbation; SNF until mid feb New  Dialysis patient started last  in Dec  Yesterday took 2.5 liters off; sometimes > 3  Still describes health as Good States oxygen is only temporary  due to recent infection in November He will wean himself off of it when he is able to start walking again  Preventive Screening -Counseling & Management   Current smoking/ tobacco status; quit approx 81 yo  Also worked with [employer]  Has had LDCT per the patient Second Hand Smoke status; No Smokers in the home  ETOH no   RISK FACTORS Regular exercise likes to walk; stays busy on the days he does not have dialysis;   Diet Travels back and forth to [employer];  Enjoys fish; cooks  [employer]; eats every am; Loves red snapper/ uses herbs when cooking Spent time reviewing sodium; BP up today but had lobster soup for lunch   Fall risk; fell x 1; when he first was released from Inpatient and was still weak but otherwise has had no more falls   Mobility of Functional changes this year? Does every thing he wants to do;  Lives in [place]; not may exercise facilities around  Lives with wife; she is still working When the weather is good, has a Psychologist, counselling; loves to work outside  Cardiac Risk Factors:  2 stents; 1999; no chest pain or issues since; followed by cardiology  Advanced aged > 15 in men;  Hyperlipidemia - hdl is low but is going to start exercising  again Diabetes neg Family History (HD )  Obesity - BMI 24.4   Eye exam/ past 2 months Dr. Patrici Ranks put a lens in his eye OD   Depression Screen PhQ 2: negative  Activities of Daily Living - See functional screen   Cognitive testing; No issues Knows the Presidents from [place] on  Ad8 score; 0 or less than 2    Advanced Directives wife had [place]  Wife has; if so he can bring a copy   Patient Care Team: [name], DO as PCP - General (Family Medicine) Rexene Agent, MD as Attending Physician (Nephrology) Dr. Prudence Davidson, gregory; Podiatry Dr. Melvyn Novas pulmonology  Dr. [name]; cardiology    Immunization History  Administered Date(s) Administered  . Influenza Split 02/21/2012  . Influenza Whole 03/23/2016  . Influenza, High Dose Seasonal PF 03/30/2013  . Influenza,inj,Quad PF,36+ Mos 02/26/2015  . Influenza-Unspecified 04/06/2014  . Pneumococcal Conjugate-13 04/07/2016  . Pneumococcal Polysaccharide-23 02/21/2012   Required Immunizations needed today  Screening test up to date or reviewed for plan of completion There are no preventive care reminders to display for this patient.        Objective:    Vitals: BP (!) 170/70   Pulse 78   Ht 5\' 7"  (1.702 m)   Wt 156 lb (70.8 kg)   SpO2 95%   BMI 24.43 kg/m   Body mass index is 24.43 kg/m.  Tobacco History  Smoking Status  . Former Smoker  . Packs/day: 1.00  . Years: 49.00  . Types: Cigarettes, Pipe, Cigars  . Quit date: 02/21/2000  Smokeless Tobacco  . Never Used     Counseling given: Yes   Past Medical History:  Diagnosis Date  . Acute and chronic respiratory failure 10/22/2012  . Atrial fibrillation ([place])   . CAD (coronary artery disease) [place], [place]  . COPD (chronic obstructive pulmonary disease) ([place])   . High cholesterol   . Hypertension   . Kidney disease    CKD4, sees [facility], considering dialysis  . Osteoarthritis 10/22/2012  . Pneumonia April 2014  . Shortness of breath dyspnea    . Stroke [facility]) 2012   Past Surgical History:  Procedure Laterality Date  . APPENDECTOMY  1984  . AV FISTULA PLACEMENT Right 05/21/2015   Procedure: Right Arm Brachiocephalic ARTERIOVENOUS (AV) FISTULA CREATION;  Surgeon: Angelia Mould, MD;  Location: [facility];  Service: Vascular;  Laterality: Right;  . COLON SURGERY  2012   partial colon removed - twisted bowel  . CORONARY STENT PLACEMENT  1999   2 stents  . CYSTOSCOPY/RETROGRADE/URETEROSCOPY Bilateral 04/23/2014   Procedure: CYSTOSCOPY BILATERAL RETROGRADE,  LEFT URETEROSCOPY, LEFT STENT PLACEMENT;  Surgeon: Raynelle Bring, MD;  Location: [facility];  Service: Urology;  Laterality: Bilateral;  . HERNIA REPAIR     Right inguinal   Family History  Problem Relation Age of Onset  . Cancer Mother     Breast  . Heart disease Father   . Heart attack Father   . Parkinson's disease Brother    History  Sexual Activity  . Sexual activity: Not on file    Outpatient Encounter Prescriptions as of 06/04/2016  Medication Sig  . albuterol (VENTOLIN HFA) 108 (90 Base) MCG/ACT inhaler Inhale 2 puffs into the lungs every 4 (four) hours as needed for wheezing or shortness of breath.  [name] aspirin EC 325 MG tablet Take 1 tablet (325 mg total) by mouth daily.  [name] atorvastatin (LIPITOR) 20 MG tablet Take 1 tablet (20 mg total) by mouth daily.  . budesonide-formoterol (SYMBICORT) 160-4.5 MCG/ACT inhaler Inhale 2 puffs into the lungs 2 (two) times daily.  . calcium acetate (PHOSLO) 667 MG capsule Take 1 to 2 capsules by mouth with each meal or snack.  . cholecalciferol (VITAMIN D) 1000 units tablet Take 1,000 Units by mouth daily.  . cloNIDine (CATAPRES) 0.1 MG tablet Take 0.1 mg by mouth daily as needed.  . ethyl chloride spray Use as directed on dialysis days.  [name] gabapentin (NEURONTIN) 100 MG capsule Take 1 capsule by mouth at bedtime.   [name] guaiFENesin (MUCINEX) 600 MG 12 hr tablet Take 600 mg by mouth 2 (two) times daily.  [name]  guaiFENesin-dextromethorphan (ROBITUSSIN DM) 100-10 MG/5ML syrup Take 5 mLs by mouth every 6 (six) hours as needed for cough.  [name] ipratropium-albuterol (DUONEB) 0.5-2.5 (3) MG/3ML SOLN Take 3 mLs by nebulization every 4 (four) hours as needed.  . methocarbamol (ROBAXIN) 500 MG tablet Take 500 mg by mouth 2 (two) times daily as needed for muscle spasms.  . midodrine (PROAMATINE) 10 MG tablet Take 10 mg by mouth as needed.  . multivitamin (RENA-VIT) TABS tablet Take 1 tablet by mouth daily.  . OXYGEN 1 to 2 lpm 24/7  . predniSONE (DELTASONE) 10 MG tablet TAKE ONE TABLET BY MOUTH ONCE DAILY WITH BREAKFAST (Patient taking differently: 1/2 tablet daily)  . SPIRIVA HANDIHALER 18 MCG inhalation capsule  Place 2 puffs into inhaler and inhale daily.   No facility-administered encounter medications on file as of 06/04/2016.     Activities of Daily Living In your present state of health, do you have any difficulty performing the following activities: 08/15/2015 07/29/2015  Hearing? N N  Vision? N N  Difficulty concentrating or making decisions? N N  Walking or climbing stairs? Y Y  Dressing or bathing? N N  Doing errands, shopping? N N  Some recent data might be hidden    Patient Care Team: [name], DO as PCP - General (Family Medicine) Rexene Agent, MD as Attending Physician (Nephrology)   Assessment:     Exercise Activities and Dietary recommendations    Goals    . Exercise 150 minutes per week (moderate activity)          Exercise as tolerated; walking is great  Increase slowly     . Reduce sodium intake          Watched  Canned foods  Info DASH diet given       Fall Risk Fall Risk  06/04/2016  Falls in the past year? Yes  Number falls in past yr: 1  Injury with Fall? No  Follow up Education provided   Depression Screen PHQ 2/9 Scores 06/04/2016  PHQ - 2 Score 0    Cognitive Function MMSE - Mini Mental State Exam 06/04/2016  Not completed: (No Data)   Very good  recall; states he worked at a [employer] station and read a lot       Immunization History  Administered Date(s) Administered  . Influenza Split 02/21/2012  . Influenza Whole 03/23/2016  . Influenza, High Dose Seasonal PF 03/30/2013  . Influenza,inj,Quad PF,36+ Mos 02/26/2015  . Influenza-Unspecified 04/06/2014  . Pneumococcal Conjugate-13 04/07/2016  . Pneumococcal Polysaccharide-23 02/21/2012   Screening Tests Health Maintenance  Topic Date Due  . TETANUS/TDAP  10/21/2022  . INFLUENZA VACCINE  Completed  . ZOSTAVAX  Completed  . PNA vac Low Risk Adult  Completed      Plan:      PCP Notes  Health Maintenance all up to date  Abnormal Screens / BP elevated today  Had sodium at lunch; stated the kidney center understands his BP is elevated when the days opposite dialysis; probably not to compliant with sodium; does not want to increase BP med due to lows in dialysis.  Patient concerns; he does not voice any concerns; manages to engaged and happy with his life  States he does not have a donor except his sister which is a little younger than him.   Nurse Concerns; reviewed high sodium foods Given the dash diet for his reference   Next PCP apt tbs   During the course of the visit the patient was educated and counseled about the following appropriate screening and preventive services:   Vaccines to include Pneumoccal, Influenza, Hepatitis B, Td, Zostavax, HCV  Electrocardiogram  Cardiovascular Disease- deferred cardiology   Colorectal cancer screening- aged out   Diabetes screening neg  Prostate Cancer Screening  Glaucoma screening  Nutrition counseling reviewed  Smoking cessation counseling/ yes Patient Instructions (the written plan) was given to the patient.    Wynetta Fines, RN  06/04/2016

## 2016-06-04 NOTE — Patient Instructions (Addendum)
[name] , Thank you for taking time to come for your Medicare Wellness Visit. I appreciate your ongoing commitment to your health goals. Please review the following plan we discussed and let me know if I can assist you in the future.   All of your preventive screens are completed     These are the goals we discussed: Goals    . Exercise 150 minutes per week (moderate activity)          Exercise as tolerated; walking is great  Increase slowly     . Reduce sodium intake          Watched  Canned foods  Info DASH diet given        This is a list of the screening recommended for you and due dates:  Health Maintenance  Topic Date Due  . Tetanus Vaccine  10/21/2022  . Flu Shot  Completed  . Shingles Vaccine  Completed  . Pneumonia vaccines  Completed   DASH Eating Plan DASH stands for "Dietary Approaches to Stop Hypertension." The DASH eating plan is a healthy eating plan that has been shown to reduce high blood pressure (hypertension). Additional health benefits may include reducing the risk of type 2 diabetes mellitus, heart disease, and stroke. The DASH eating plan may also help with weight loss. What do I need to know about the DASH eating plan? For the DASH eating plan, you will follow these general guidelines:  Choose foods with less than 150 milligrams of sodium per serving (as listed on the food label).  Use salt-free seasonings or herbs instead of table salt or sea salt.  Check with your health care provider or pharmacist before using salt substitutes.  Eat lower-sodium products. These are often labeled as "low-sodium" or "no salt added."  Eat fresh foods. Avoid eating a lot of canned foods.  Eat more vegetables, fruits, and low-fat dairy products.  Choose whole grains. Look for the word "whole" as the first word in the ingredient list.  Choose fish and skinless chicken or [place] more often than red meat. Limit fish, poultry, and meat to 6 oz (170 g) each  day.  Limit sweets, desserts, sugars, and sugary drinks.  Choose heart-healthy fats.  Eat more home-cooked food and less restaurant, buffet, and fast food.  Limit fried foods.  Do not fry foods. Cook foods using methods such as baking, boiling, grilling, and broiling instead.  When eating at a restaurant, ask that your food be prepared with less salt, or no salt if possible. What foods can I eat? Seek help from a dietitian for individual calorie needs. Grains  Whole grain or whole wheat bread. Brown rice. Whole grain or whole wheat pasta. Quinoa, bulgur, and whole grain cereals. Low-sodium cereals. Corn or whole wheat flour tortillas. Whole grain cornbread. Whole grain crackers. Low-sodium crackers. Vegetables  Fresh or frozen vegetables (raw, steamed, roasted, or grilled). Low-sodium or reduced-sodium tomato and vegetable juices. Low-sodium or reduced-sodium tomato sauce and paste. Low-sodium or reduced-sodium canned vegetables. Fruits  All fresh, canned (in natural juice), or frozen fruits. Meat and Other Protein Products  Ground beef (85% or leaner), grass-fed beef, or beef trimmed of fat. Skinless chicken or [place]. Ground chicken or [place]. Pork trimmed of fat. All fish and seafood. Eggs. Dried beans, peas, or lentils. Unsalted nuts and seeds. Unsalted canned beans. Dairy  Low-fat dairy products, such as skim or 1% milk, 2% or reduced-fat cheeses, low-fat ricotta or cottage cheese, or plain low-fat yogurt.  Low-sodium or reduced-sodium cheeses. Fats and Oils  Tub margarines without trans fats. Light or reduced-fat mayonnaise and salad dressings (reduced sodium). Avocado. Safflower, olive, or canola oils. Natural peanut or almond butter. Other  Unsalted popcorn and pretzels. The items listed above may not be a complete list of recommended foods or beverages. Contact your dietitian for more options.  What foods are not recommended? Grains  White bread. White pasta. White rice.  Refined cornbread. Bagels and croissants. Crackers that contain trans fat. Vegetables  Creamed or fried vegetables. Vegetables in a cheese sauce. Regular canned vegetables. Regular canned tomato sauce and paste. Regular tomato and vegetable juices. Fruits  Canned fruit in light or heavy syrup. Fruit juice. Meat and Other Protein Products  Fatty cuts of meat. Ribs, chicken wings, bacon, sausage, bologna, salami, chitterlings, fatback, hot dogs, bratwurst, and packaged luncheon meats. Salted nuts and seeds. Canned beans with salt. Dairy  Whole or 2% milk, cream, half-and-half, and cream cheese. Whole-fat or sweetened yogurt. Full-fat cheeses or blue cheese. Nondairy creamers and whipped toppings. Processed cheese, cheese spreads, or cheese curds. Condiments  Onion and garlic salt, seasoned salt, table salt, and sea salt. Canned and packaged gravies. Worcestershire sauce. Tartar sauce. Barbecue sauce. Teriyaki sauce. Soy sauce, including reduced sodium. Steak sauce. Fish sauce. Oyster sauce. Cocktail sauce. Horseradish. Ketchup and mustard. Meat flavorings and tenderizers. Bouillon cubes. Hot sauce. Tabasco sauce. Marinades. Taco seasonings. Relishes. Fats and Oils  Butter, stick margarine, lard, shortening, ghee, and bacon fat. Coconut, palm kernel, or palm oils. Regular salad dressings. Other  Pickles and olives. Salted popcorn and pretzels. The items listed above may not be a complete list of foods and beverages to avoid. Contact your dietitian for more information.  Where can I find more information? National Heart, Lung, and Blood Institute: travelstabloid.com This information is not intended to replace advice given to you by your health care provider. Make sure you discuss any questions you have with your health care provider. Document Released: 05/28/2011 Document Revised: 11/14/2015 Document Reviewed: 04/12/2013 Elsevier Interactive Patient Education  2017  [place] Prevention in the Home Introduction Falls can cause injuries. They can happen to people of all ages. There are many things you can do to make your home safe and to help prevent falls. What can I do on the outside of my home?  Regularly fix the edges of walkways and driveways and fix any cracks.  Remove anything that might make you trip as you walk through a door, such as a raised step or threshold.  Trim any bushes or trees on the path to your home.  Use bright outdoor lighting.  Clear any walking paths of anything that might make someone trip, such as rocks or tools.  Regularly check to see if handrails are loose or broken. Make sure that both sides of any steps have handrails.  Any raised decks and porches should have guardrails on the edges.  Have any leaves, snow, or ice cleared regularly.  Use sand or salt on walking paths during winter.  Clean up any spills in your garage right away. This includes oil or grease spills. What can I do in the bathroom?  Use night lights.  Install grab bars by the toilet and in the tub and shower. Do not use towel bars as grab bars.  Use non-skid mats or decals in the tub or shower.  If you need to sit down in the shower, use a plastic, non-slip stool.  Keep  the floor dry. Clean up any water that spills on the floor as soon as it happens.  Remove soap buildup in the tub or shower regularly.  Attach bath mats securely with double-sided non-slip rug tape.  Do not have throw rugs and other things on the floor that can make you trip. What can I do in the bedroom?  Use night lights.  Make sure that you have a light by your bed that is easy to reach.  Do not use any sheets or blankets that are too big for your bed. They should not hang down onto the floor.  Have a firm chair that has side arms. You can use this for support while you get dressed.  Do not have throw rugs and other things on the floor that can make  you trip. What can I do in the kitchen?  Clean up any spills right away.  Avoid walking on wet floors.  Keep items that you use a lot in easy-to-reach places.  If you need to reach something above you, use a strong step stool that has a grab bar.  Keep electrical cords out of the way.  Do not use floor polish or wax that makes floors slippery. If you must use wax, use non-skid floor wax.  Do not have throw rugs and other things on the floor that can make you trip. What can I do with my stairs?  Do not leave any items on the stairs.  Make sure that there are handrails on both sides of the stairs and use them. Fix handrails that are broken or loose. Make sure that handrails are as long as the stairways.  Check any carpeting to make sure that it is firmly attached to the stairs. Fix any carpet that is loose or worn.  Avoid having throw rugs at the top or bottom of the stairs. If you do have throw rugs, attach them to the floor with carpet tape.  Make sure that you have a light switch at the top of the stairs and the bottom of the stairs. If you do not have them, ask someone to add them for you. What else can I do to help prevent falls?  Wear shoes that:  Do not have high heels.  Have rubber bottoms.  Are comfortable and fit you well.  Are closed at the toe. Do not wear sandals.  If you use a stepladder:  Make sure that it is fully opened. Do not climb a closed stepladder.  Make sure that both sides of the stepladder are locked into place.  Ask someone to hold it for you, if possible.  Clearly mark and make sure that you can see:  Any grab bars or handrails.  First and last steps.  Where the edge of each step is.  Use tools that help you move around (mobility aids) if they are needed. These include:  Canes.  Walkers.  Scooters.  Crutches.  Turn on the lights when you go into a dark area. Replace any light bulbs as soon as they burn out.  Set up your  furniture so you have a clear path. Avoid moving your furniture around.  If any of your floors are uneven, fix them.  If there are any pets around you, be aware of where they are.  Review your medicines with your doctor. Some medicines can make you feel dizzy. This can increase your chance of falling. Ask your doctor what other things that you can do  to help prevent falls. This information is not intended to replace advice given to you by your health care provider. Make sure you discuss any questions you have with your health care provider. Document Released: 04/04/2009 Document Revised: 11/14/2015 Document Reviewed: 07/13/2014  2017 Elsevier  Health Maintenance, Male A healthy lifestyle and preventative care can promote health and wellness.  Maintain regular health, dental, and eye exams.  Eat a healthy diet. Foods like vegetables, fruits, whole grains, low-fat dairy products, and lean protein foods contain the nutrients you need and are low in calories. Decrease your intake of foods high in solid fats, added sugars, and salt. Get information about a proper diet from your health care provider, if necessary.  Regular physical exercise is one of the most important things you can do for your health. Most adults should get at least 150 minutes of moderate-intensity exercise (any activity that increases your heart rate and causes you to sweat) each week. In addition, most adults need muscle-strengthening exercises on 2 or more days a week.   Maintain a healthy weight. The body mass index (BMI) is a screening tool to identify possible weight problems. It provides an estimate of body fat based on height and weight. Your health care provider can find your BMI and can help you achieve or maintain a healthy weight. For males 20 years and older:  A BMI below 18.5 is considered underweight.  A BMI of 18.5 to 24.9 is normal.  A BMI of 25 to 29.9 is considered overweight.  A BMI of 30 and above is  considered obese.  Maintain normal blood lipids and cholesterol by exercising and minimizing your intake of saturated fat. Eat a balanced diet with plenty of fruits and vegetables. Blood tests for lipids and cholesterol should begin at age 44 and be repeated every 5 years. If your lipid or cholesterol levels are high, you are over age 61, or you are at high risk for heart disease, you may need your cholesterol levels checked more frequently.Ongoing high lipid and cholesterol levels should be treated with medicines if diet and exercise are not working.  If you smoke, find out from your health care provider how to quit. If you do not use tobacco, do not start.  Lung cancer screening is recommended for adults aged 75-80 years who are at high risk for developing lung cancer because of a history of smoking. A yearly low-dose CT scan of the lungs is recommended for people who have at least a 30-pack-year history of smoking and are current smokers or have quit within the past 15 years. A pack year of smoking is smoking an average of 1 pack of cigarettes a day for 1 year (for example, a 30-pack-year history of smoking could mean smoking 1 pack a day for 30 years or 2 packs a day for 15 years). Yearly screening should continue until the smoker has stopped smoking for at least 15 years. Yearly screening should be stopped for people who develop a health problem that would prevent them from having lung cancer treatment.  If you choose to drink alcohol, do not have more than 2 drinks per day. One drink is considered to be 12 oz (360 mL) of beer, 5 oz (150 mL) of wine, or 1.5 oz (45 mL) of liquor.  Avoid the use of street drugs. Do not share needles with anyone. Ask for help if you need support or instructions about stopping the use of drugs.  High blood pressure causes heart  disease and increases the risk of stroke. High blood pressure is more likely to develop in:  People who have blood pressure in the end of the  normal range (100-139/85-89 mm Hg).  People who are overweight or obese.  People who are African American.  If you are 55-24 years of age, have your blood pressure checked every 3-5 years. If you are 73 years of age or older, have your blood pressure checked every year. You should have your blood pressure measured twice-once when you are at a hospital or clinic, and once when you are not at a hospital or clinic. Record the average of the two measurements. To check your blood pressure when you are not at a hospital or clinic, you can use:  An automated blood pressure machine at a pharmacy.  A home blood pressure monitor.  If you are 67-69 years old, ask your health care provider if you should take aspirin to prevent heart disease.  Diabetes screening involves taking a blood sample to check your fasting blood sugar level. This should be done once every 3 years after age 61 if you are at a normal weight and without risk factors for diabetes. Testing should be considered at a younger age or be carried out more frequently if you are overweight and have at least 1 risk factor for diabetes.  Colorectal cancer can be detected and often prevented. Most routine colorectal cancer screening begins at the age of 52 and continues through age 2. However, your health care provider may recommend screening at an earlier age if you have risk factors for colon cancer. On a yearly basis, your health care provider may provide home test kits to check for hidden blood in the stool. A small camera at the end of a tube may be used to directly examine the colon (sigmoidoscopy or colonoscopy) to detect the earliest forms of colorectal cancer. Talk to your health care provider about this at age 58 when routine screening begins. A direct exam of the colon should be repeated every 5-10 years through age 17, unless early forms of precancerous polyps or small growths are found.  People who are at an increased risk for hepatitis B  should be screened for this virus. You are considered at high risk for hepatitis B if:  You were born in a country where hepatitis B occurs often. Talk with your health care provider about which countries are considered high risk.  Your parents were born in a high-risk country and you have not received a shot to protect against hepatitis B (hepatitis B vaccine).  You have HIV or AIDS.  You use needles to inject street drugs.  You live with, or have sex with, someone who has hepatitis B.  You are a man who has sex with other men (MSM).  You get hemodialysis treatment.  You take certain medicines for conditions like cancer, organ transplantation, and autoimmune conditions.  Hepatitis C blood testing is recommended for all people born from [birth year] through 1963 and any individual with known risk factors for hepatitis C.  Healthy men should no longer receive prostate-specific antigen (PSA) blood tests as part of routine cancer screening. Talk to your health care provider about prostate cancer screening.  Testicular cancer screening is not recommended for adolescents or adult males who have no symptoms. Screening includes self-exam, a health care provider exam, and other screening tests. Consult with your health care provider about any symptoms you have or any concerns you have about testicular  cancer.  Practice safe sex. Use condoms and avoid high-risk sexual practices to reduce the spread of sexually transmitted infections (STIs).  You should be screened for STIs, including gonorrhea and chlamydia if:  You are sexually active and are younger than 24 years.  You are older than 24 years, and your health care provider tells you that you are at risk for this type of infection.  Your sexual activity has changed since you were last screened, and you are at an increased risk for chlamydia or gonorrhea. Ask your health care provider if you are at risk.  If you are at risk of being infected with  HIV, it is recommended that you take a prescription medicine daily to prevent HIV infection. This is called pre-exposure prophylaxis (PrEP). You are considered at risk if:  You are a man who has sex with other men (MSM).  You are a heterosexual man who is sexually active with multiple partners.  You take drugs by injection.  You are sexually active with a partner who has HIV.  Talk with your health care provider about whether you are at high risk of being infected with HIV. If you choose to begin PrEP, you should first be tested for HIV. You should then be tested every 3 months for as long as you are taking PrEP.  Use sunscreen. Apply sunscreen liberally and repeatedly throughout the day. You should seek shade when your shadow is shorter than you. Protect yourself by wearing long sleeves, pants, a wide-brimmed hat, and sunglasses year round whenever you are outdoors.  Tell your health care provider of new moles or changes in moles, especially if there is a change in shape or color. Also, tell your health care provider if a mole is larger than the size of a pencil eraser.  A one-time screening for abdominal aortic aneurysm (AAA) and surgical repair of large AAAs by ultrasound is recommended for men aged 71-75 years who are current or former smokers.  Stay current with your vaccines (immunizations). This information is not intended to replace advice given to you by your health care provider. Make sure you discuss any questions you have with your health care provider. Document Released: 12/05/2007 Document Revised: 06/29/2014 Document Reviewed: 03/12/2015 Elsevier Interactive Patient Education  2017 [employer].

## 2016-06-04 NOTE — Progress Notes (Signed)
I have reviewed and agree with note, evaluation, plan. Noted BP elevation- appreciate encouragement for patient to follow up with renal for any adjustments especially as they monitor in dialysis.  Garret Reddish, MD

## 2016-06-05 ENCOUNTER — Ambulatory Visit: Payer: Medicare PPO

## 2016-06-05 DIAGNOSIS — J156 Pneumonia due to other aerobic Gram-negative bacteria: Secondary | ICD-10-CM | POA: Diagnosis not present

## 2016-06-05 DIAGNOSIS — N186 End stage renal disease: Secondary | ICD-10-CM | POA: Diagnosis not present

## 2016-06-05 DIAGNOSIS — J150 Pneumonia due to Klebsiella pneumoniae: Secondary | ICD-10-CM | POA: Diagnosis not present

## 2016-06-08 DIAGNOSIS — J150 Pneumonia due to Klebsiella pneumoniae: Secondary | ICD-10-CM | POA: Diagnosis not present

## 2016-06-08 DIAGNOSIS — J156 Pneumonia due to other aerobic Gram-negative bacteria: Secondary | ICD-10-CM | POA: Diagnosis not present

## 2016-06-08 DIAGNOSIS — N186 End stage renal disease: Secondary | ICD-10-CM | POA: Diagnosis not present

## 2016-06-08 NOTE — Telephone Encounter (Signed)
Pt calling to report the following BP readings- 12/13-at dialysis-165/70,148/62,138/58,130/60 12/15- at dialysis-170/60,172/82,165/77,160/72,155/65. Recent home readings- 160/65,175/80,170/73,165/70 155/65,148/60,160/67,170/60,168/58 Heart rate usually 90-110.  He checked heart rate while on phone with me and it was 79.  I told pt I would forward to Dr. [name] for review/recommendations.  He requests call back tomorrow or Thursday as he has dialysis on M,W, F.

## 2016-06-08 NOTE — Telephone Encounter (Signed)
Terry [name], I think it would be best for him to discuss his BP with his nephrologist since he is on dialysis. If I change his medications and he has hypotension, he will not be able to go to dialysis. [name]

## 2016-06-09 NOTE — Telephone Encounter (Signed)
Pt notified. He will discuss with Dr. Lorrene Reid.

## 2016-06-10 DIAGNOSIS — J150 Pneumonia due to Klebsiella pneumoniae: Secondary | ICD-10-CM | POA: Diagnosis not present

## 2016-06-10 DIAGNOSIS — J156 Pneumonia due to other aerobic Gram-negative bacteria: Secondary | ICD-10-CM | POA: Diagnosis not present

## 2016-06-10 DIAGNOSIS — N186 End stage renal disease: Secondary | ICD-10-CM | POA: Diagnosis not present

## 2016-06-12 DIAGNOSIS — J156 Pneumonia due to other aerobic Gram-negative bacteria: Secondary | ICD-10-CM | POA: Diagnosis not present

## 2016-06-12 DIAGNOSIS — N186 End stage renal disease: Secondary | ICD-10-CM | POA: Diagnosis not present

## 2016-06-12 DIAGNOSIS — J150 Pneumonia due to Klebsiella pneumoniae: Secondary | ICD-10-CM | POA: Diagnosis not present

## 2016-06-14 DIAGNOSIS — J150 Pneumonia due to Klebsiella pneumoniae: Secondary | ICD-10-CM | POA: Diagnosis not present

## 2016-06-14 DIAGNOSIS — J156 Pneumonia due to other aerobic Gram-negative bacteria: Secondary | ICD-10-CM | POA: Diagnosis not present

## 2016-06-14 DIAGNOSIS — N186 End stage renal disease: Secondary | ICD-10-CM | POA: Diagnosis not present

## 2016-06-16 DIAGNOSIS — I5031 Acute diastolic (congestive) heart failure: Secondary | ICD-10-CM | POA: Diagnosis not present

## 2016-06-16 DIAGNOSIS — I2510 Atherosclerotic heart disease of native coronary artery without angina pectoris: Secondary | ICD-10-CM | POA: Diagnosis not present

## 2016-06-16 DIAGNOSIS — L0390 Cellulitis, unspecified: Secondary | ICD-10-CM | POA: Diagnosis not present

## 2016-06-16 DIAGNOSIS — D500 Iron deficiency anemia secondary to blood loss (chronic): Secondary | ICD-10-CM | POA: Diagnosis not present

## 2016-06-16 DIAGNOSIS — J449 Chronic obstructive pulmonary disease, unspecified: Secondary | ICD-10-CM | POA: Diagnosis not present

## 2016-06-16 DIAGNOSIS — R2681 Unsteadiness on feet: Secondary | ICD-10-CM | POA: Diagnosis not present

## 2016-06-16 DIAGNOSIS — K922 Gastrointestinal hemorrhage, unspecified: Secondary | ICD-10-CM | POA: Diagnosis not present

## 2016-06-16 DIAGNOSIS — M6281 Muscle weakness (generalized): Secondary | ICD-10-CM | POA: Diagnosis not present

## 2016-06-16 DIAGNOSIS — Z992 Dependence on renal dialysis: Secondary | ICD-10-CM | POA: Diagnosis not present

## 2016-06-17 DIAGNOSIS — J156 Pneumonia due to other aerobic Gram-negative bacteria: Secondary | ICD-10-CM | POA: Diagnosis not present

## 2016-06-17 DIAGNOSIS — J150 Pneumonia due to Klebsiella pneumoniae: Secondary | ICD-10-CM | POA: Diagnosis not present

## 2016-06-17 DIAGNOSIS — N186 End stage renal disease: Secondary | ICD-10-CM | POA: Diagnosis not present

## 2016-06-18 ENCOUNTER — Telehealth: Payer: Self-pay | Admitting: Family Medicine

## 2016-06-18 NOTE — Telephone Encounter (Signed)
I don't believe this a medication we have prescribed for him.

## 2016-06-18 NOTE — Telephone Encounter (Signed)
Pt need new Rx for methocarbamol (ROBAXIN)  Pharm:  [facility] in [place]

## 2016-06-19 DIAGNOSIS — J156 Pneumonia due to other aerobic Gram-negative bacteria: Secondary | ICD-10-CM | POA: Diagnosis not present

## 2016-06-19 DIAGNOSIS — N186 End stage renal disease: Secondary | ICD-10-CM | POA: Diagnosis not present

## 2016-06-19 DIAGNOSIS — J150 Pneumonia due to Klebsiella pneumoniae: Secondary | ICD-10-CM | POA: Diagnosis not present

## 2016-06-19 NOTE — Telephone Encounter (Signed)
Would recommend he discuss with his dialysis doctor to ensure dosed correctly. Thanks.

## 2016-06-19 NOTE — Telephone Encounter (Signed)
I called the pt and informed him of the message below and he stated he was given this medication by a physician last year when he was in the hospital and this helps with his leg cramps during the dialysis from the swelling and fluid retention and this helps.  Message sent to Dr Maudie Mercury.

## 2016-06-19 NOTE — Telephone Encounter (Signed)
I called the pt and informed him of the message below. 

## 2016-06-21 DIAGNOSIS — E1122 Type 2 diabetes mellitus with diabetic chronic kidney disease: Secondary | ICD-10-CM | POA: Diagnosis not present

## 2016-06-21 DIAGNOSIS — J156 Pneumonia due to other aerobic Gram-negative bacteria: Secondary | ICD-10-CM | POA: Diagnosis not present

## 2016-06-21 DIAGNOSIS — Z992 Dependence on renal dialysis: Secondary | ICD-10-CM | POA: Diagnosis not present

## 2016-06-21 DIAGNOSIS — N186 End stage renal disease: Secondary | ICD-10-CM | POA: Diagnosis not present

## 2016-06-21 DIAGNOSIS — J150 Pneumonia due to Klebsiella pneumoniae: Secondary | ICD-10-CM | POA: Diagnosis not present

## 2016-07-07 ENCOUNTER — Ambulatory Visit: Payer: Medicare PPO | Admitting: Internal Medicine

## 2016-07-21 ENCOUNTER — Ambulatory Visit (INDEPENDENT_AMBULATORY_CARE_PROVIDER_SITE_OTHER): Payer: Medicare PPO | Admitting: Internal Medicine

## 2016-07-21 ENCOUNTER — Encounter: Payer: Self-pay | Admitting: Internal Medicine

## 2016-07-21 VITALS — BP 130/60 | HR 68 | Ht 67.0 in | Wt 157.0 lb

## 2016-07-21 DIAGNOSIS — J9611 Chronic respiratory failure with hypoxia: Secondary | ICD-10-CM | POA: Diagnosis not present

## 2016-07-21 DIAGNOSIS — J449 Chronic obstructive pulmonary disease, unspecified: Secondary | ICD-10-CM

## 2016-07-21 NOTE — Patient Instructions (Addendum)
Try off spiriva (think of it like high octane fuel)  See calendar for specific medication instructions and bring it back for each and every office visit for every healthcare provider you see.  Without it,  you may not receive the best quality medical care that we feel you deserve.  You will note that the calendar groups together  your maintenance  medications that are timed at particular times of the day.  Think of this as your checklist for what your doctor has instructed you to do until your next evaluation to see what benefit  there is  to staying on a consistent group of medications intended to keep you well.  The other group at the bottom is entirely up to you to use as you see fit  for specific symptoms that may arise between visits that require you to treat them on an as needed basis.  Think of this as your action plan or "what if" list.   Separating the top medications from the bottom group is fundamental to providing you adequate care going forward.   If not doing well with your medications as they are listed on the med calendar, first see Tammy NP with the calendar we made today and all you medications in 2 bags, your automatics vs your as neededs.  She will get you squared away and scheduled then to see me in follow up

## 2016-07-21 NOTE — Assessment & Plan Note (Signed)
-   Trial off ACEi 11/30/2012 >>> much better symptom control - 05/28/2014   Walked RA x one lap @ 185 stopped due to  [employer] / sats ok/ mod pace - PFT's 01/26/13      FEV1  1.28 (48%) ratio 48 and no better p B2,  DLCO 39 corrects to 48%  - PFTs 02/26/2015   FEV1 0.99  (35 % ) ratio 35  p 11 % improvement from saba with DLCO  24 % corrects to 35  % for alv volume  p am symb/spiriva    - Pred maint rx  Since d/c 08/14/15  - 09/26/2015  extensive coaching HFA effectiveness =    75%  - 11/26/2015 rec reduce pred to 5 mg daily   - 05/22/2016  After extensive coaching HFA effectiveness =  90%  - 07/21/2016 try off spiriva due to throat irritation   Despite continued challenges with pt and his meds he's doing much better   I had an extended final summary discussion with the patient reviewing all relevant studies completed to date and  lasting 15 to 20 minutes of a 25 minute visit on the following issues:   1) try off spiriva and add it back if breathing worse  2) Each maintenance medication was reviewed in detail including most importantly the difference between maintenance and as needed and under what circumstances the prns are to be used. This was done in the context of a medication calendar newly generated today and reviewed line by line - this   provided the patient with a user-friendly unambiguous mechanism for medication administration and reconciliation and provides an action plan for all active problems. It is critical that this be shown to every doctor  for modification during the office visit if necessary so the patient can use it as a working document.       Pulmonary f/u is prn

## 2016-07-21 NOTE — Assessment & Plan Note (Signed)
02 rx as of  d/c 08/14/15   As of 07/21/2016 =  1lpm at home/ 2lpm POC prn daytime though not using at present

## 2016-07-21 NOTE — Progress Notes (Signed)
Subjective:    Patient ID: Terry [name], male    DOB: 05/03/1935    MRN: [id number]   Brief patient profile:  85 yowm quit smoking 2001 bothered by doe seemed better then worse in 2010 p "lung infection" and stayed on advair/spiriva  Combo since 2012 seemed better then admitted in [facility] with pna 10/09/12  in then transferred to [facility] x 3 weeks and discharged  On May 1st  2014 referred to pulmonary clinic 11/29/12  by [facility] for copd eval and proved to have GOLD III copd 01/26/13 with symptoms much better off ACEi.    History of Present Illness 08/27/2015  Extended post hosp /transition of care  f/u ov/Greidy Sherard re: COPD GOLD III recurrent admits/ now chronic resp failure/HD dep CRI plus GOLD III rx symb/spriiva/neb saba Chief Complaint  Patient presents with  . Follow-up    Pt c/o occasional cough with white mucus. Pt denies wheeze/increased SOB/CP/tightness. Pt is on O2/2L, he states that on 3L it dries out his nose and burns. Pt also c/o low BP readings and wonders if some of his HTN meds need to be changed.   now sleeping in a recliner since admit 30-45 degrees due to sob/some noct cough but excess/ purulent sputum or mucus plugs   On prednisone with floor of   @ 10 mg since d/c 08/19/15  rec May need to consider adding Oracit to your regimen and I will le Dr Lorrene Reid know You will need to adjust your 02 to saturations of over 90%  Best fit and humidified 02 ordered  Plan A = Automatic = Symbicort 160 2 in am and 2 in pm with spiriva each am and prednisone 10 mg with breakfast daily  Work on inhaler technique:  Plan B = Backup Only use your albuterol (proventil) as a rescue medication Plan C = Crisis - only use your albuterol nebulizer if you first try Plan B and it fails to help > ok to use the nebulizer up to every 4 hours but if start needing it regularly call for immediate appointment   09/26/2015  f/u ov/Eneida Evers re: COPD  GOLD  III on symbicort 160/spiriva and 02 2lpm 24/7  Chief  Complaint  Patient presents with  . Follow-up    Cough has improved some- requesting refill on tessalon.   doe/ weak x room to room  Prednisone 10 mg daily floor for now  No need for saba or neb  At this point  rec Prednisone 10 mg one on even and one-half on odd days x 2 weeks and if not change then reduce to 5 mg daily  Work on maintaining perfect inhaler technique:   Goal for 02 is 90% or above / ok adjust up  Or down or off as along as attaining this goal      01/07/2016  f/u ov/Chrisotpher Rivero re:  Copd GOLD III/ rx 5 prednisone / symb/spiriva and rare saba  (maybe once a week)  02 hs only  Chief Complaint  Patient presents with  . Follow-up    pt states breathing is baseline since last OV. c/o sob & non prod cough.  02 1lpm hs rarely during the day / c/o dry mouth ? From spiriva dpi Doe = MMRC3 = can't walk 100 yards even at a slow pace at a flat grade s stopping due to sob   rec Plan A = Automatic = symbicort 160 Take 2 puffs first thing in am and then another  2 puffs about 12 hours later and spiriva 1.25 x 4 or 2.5 x 2puffs in am only  Plan B = Backup Only use your albuterol (ventolin) as a rescue medication  Plan C = Crisis - only use your albuterol nebulizer if you first try Plan B and it fails to help > ok to use the nebulizer up to every 4 hours but if start needing it regularly call for immediate appointment Prednisone 5 mg daily   Please schedule a follow up visit in 3 months but call sooner if needed        05/06/2016 acute extended ov/Jenetta Wease re:   Copd flare on pred 10 one half daily insists he's not on amiodarone  Chief Complaint  Patient presents with  . Acute Visit    Pt c/o increased cough and wheezing for the past 10 days. His cough is prod with yellow sputum.    onset was 10 days prior to OV  Onset with sore throat ? Has flutter/ very confused again re details of care   >>zpack / pred     05/07/2016 NP Follow up : COPD  Pt returns for follow up for med review. We  reviewed all his medications organize them into a medication count with patient education. Several medications are not brought today. However, patient says he has them at home. was seen 05/06/16 for COPD exacerbation, started on a Z-Pak and prednisone burst. Patient has started Z-Pak and says that he is starting to feel better. Unfortunately he did not start the prednisone   rec Follow med calendar closely and bring to each visit.  Taper prednisone as below : 4 tabs for 2 days, 2 tabs daily for  2 days, then 1 tab daily for 2 days , then 1/2 tab daily .     05/22/2016  f/u ov/Kayleann Mccaffery re: COPD GOLDI III/ steroid dep / 02 dep/ has med cal with multiple erroneous edits he's made with wrong meds in wrong columns and note did not bring all meds in as requested for med reconciliation/ med calendar Chief Complaint  Patient presents with  . Follow-up    Medication review.   does HD MWF  On 02 24/7  1lpm  on conc and 2lpm on POC  Some cough/ nasal and chest congestion improved with pred burst and taper to 5 mg daily Min need for saba on sym/spiriva   rec Ok for stuffy nose to use nasal saline as needed as per med calendar Work on perfecting  inhaler technique:  See calendar for specific medication instructions and bring it back for each and every office visit     07/21/2016  f/u ov/Israel Wunder re:  GOLD III/ symbicort and spriva dpi / no med calendar Chief Complaint  Patient presents with  . Follow-up    Breathing is doing well. He has not needed any albuterol.    nasal drainage symptoms xyears do no wake him up, few coughs and it's gone each am       No obvious day to day or daytime variability or assoc excess/ purulent sputum or mucus plugs or hemoptysis or cp or chest tightness, subjective wheeze or overt sinus or hb symptoms. No unusual exp hx or h/o childhood pna/ asthma or knowledge of premature birth.  Sleeping ok without nocturnal  or early am exacerbation  of respiratory  c/o's or need for noct  saba. Also denies any obvious fluctuation of symptoms with weather or environmental changes or other aggravating or alleviating  factors except as outlined above   Current Medications, Allergies, Complete Past Medical History, Past Surgical History, Family History, and Social History were reviewed in [employer] record.  ROS  The following are not active complaints unless bolded sore throat, dysphagia, dental problems, itching, sneezing,  nasal congestion the same or excess/ purulent secretions, ear ache,   fever, chills, sweats, unintended wt loss, classically pleuritic or exertional cp,  orthopnea pnd or leg swelling, presyncope, palpitations, abdominal pain, anorexia, nausea, vomiting, diarrhea  or change in bowel or bladder habits, change in stools or urine, dysuria,hematuria,  rash, arthralgias, visual complaints, headache, numbness, weakness or ataxia or problems with walking or coordination,  change in mood/affect or memory.            Objective:   Physical Exam  [place] amb wm nad    02/26/2015          210 >  08/27/2015 163 p HD > 09/26/2015  159 > 11/26/2015  160 > 01/07/2016  159 > 04/07/2016  159 > 05/06/2016>   07/21/2016  157     Vital signs reviewed   - Note on arrival 02 sats  95% on RA  HEENT: nl dentition, and oropharynx. Nl external ear canals without cough reflex - moderate bilateral non-specific turbinate edema     NECK :  without JVD/Nodes/TM/ nl carotid upstrokes bilaterally   LUNGS: no acc muscle use,  Barrel chest, decreased BS in bases bilaterally but clear   CV:  RRR  no s3 or murmur or increase in P2, no edema   ABD:  soft and nontender with nl inspiratory excursion in the supine position. No bruits or organomegaly, bowel sounds nl  MS:  Nl gait/ ext warm without deformities, calf tenderness, cyanosis or clubbing No obvious joint restrictions   SKIN: warm and dry without lesions    NEURO:  alert, approp, nl sensorium with  no motor deficits       I personally reviewed images and agree with radiology impression as follows:  CXR:   05/06/16 COPD with chronic bibasilar atelectasis/ scarring. No change from prior studies.

## 2016-07-30 ENCOUNTER — Ambulatory Visit: Payer: Medicare PPO | Admitting: Podiatry

## 2016-08-06 ENCOUNTER — Encounter: Payer: Self-pay | Admitting: Podiatry

## 2016-08-06 ENCOUNTER — Ambulatory Visit (INDEPENDENT_AMBULATORY_CARE_PROVIDER_SITE_OTHER): Payer: Medicare PPO | Admitting: Podiatry

## 2016-08-06 VITALS — Ht 67.0 in | Wt 157.0 lb

## 2016-08-06 DIAGNOSIS — M79676 Pain in unspecified toe(s): Secondary | ICD-10-CM | POA: Diagnosis not present

## 2016-08-06 DIAGNOSIS — B351 Tinea unguium: Secondary | ICD-10-CM

## 2016-08-06 NOTE — Progress Notes (Signed)
   Subjective:    Patient ID: Terry [name], male    DOB: 06/05/1935, 82 y.o.   MRN: [id number]  HPI this patient presents to my office with  complaint of long thick painful nails.  Nails are painful walking and wearing his shoes. He states he is unable to self treat. Patient does give a history of a back injury which causes numbness into the toes on both feet. He also describes having significant kidney failure as well as heart disease. He presents the office today for definitive evaluation and treatment of his nails.    Review of Systems  All other systems reviewed and are negative.      Objective:   Physical Exam GENERAL APPEARANCE: Alert, conversant. Appropriately groomed. No acute distress.  VASCULAR: Pedal pulses are  palpable at  [facility] and PT bilateral.  Capillary refill time is immediate to all digits,  Normal temperature gradient.   NEUROLOGIC: sensation is normal to 5.07 monofilament at 5/5 sites bilateral.  Light touch is intact bilateral, Muscle strength normal.  MUSCULOSKELETAL: acceptable muscle strength, tone and stability bilateral.  Intrinsic muscluature intact bilateral.  Rectus appearance of foot and digits noted bilateral.   DERMATOLOGIC: skin color, texture, and turgor are within normal limits.  No preulcerative lesions or ulcers  are seen, no interdigital maceration noted.  No open lesions present.   No drainage noted.  NAILS Thick disfigured discolored nails both feet.  Healing noted hallux following his previous surgery..         Assessment & Plan:  Onychomycosis  B/L    IE  Debridement of elongated ingrown toenails B/l  RTC 10 weeks.. Patient has pain out of proportion when debriding his nails left foot.   Gardiner Barefoot DPM

## 2016-08-07 ENCOUNTER — Ambulatory Visit: Payer: Medicare PPO | Admitting: Internal Medicine

## 2016-08-11 NOTE — Progress Notes (Signed)
Chief Complaint  Patient presents with  . Shortness of Breath     History of Present Illness: 82 yo male with history of PAF, HTN, HLD, CVA, COPD, CAD, ESRD on HD here today for cardiac follow up. He had been followed by cardiology in [place], [name] for many years until he moved to [place] in 2014. Cath records received from [facility] . Cardiac cath 11/30/1997. Mild distal Left main stenosis, 25% proximal LAD, minor irregularities Circumflex, 95% mid RCA stenosis treated with 3.5 x 32 mm bare metal stent. There was a ? Overlapping 3.5 x 18 mm bare metal stent more proximally. Echo 02/03/13 with LVEF=50-55%. He had normal carotid dopplers in [place] 2013. He was admitted to [facility] October 2015 with ruptured kidney cyst. Atrial fibrillation diagnosed in December 2015 at an office visit. He was initially started on Eliquis but progressed to ESRD so this was stopped. He was started on coumadin but had GI blood loss and severe bruising so this was stopped. He has been on low dose beta blocker therapy. He had been started on amiodarone but this was stopped. Low risk stress myoview September 2016.    He is here today for follow up. No chest pain. He is feeling well overall. No dizziness, near syncope or syncope. No LE edema. He has had low BP on HD.   Primary Care Physician: [name], DO   Past Medical History:  Diagnosis Date  . Acute and chronic respiratory failure 10/22/2012  . Atrial fibrillation ([place])   . CAD (coronary artery disease) [place], [place]  . COPD (chronic obstructive pulmonary disease) ([place])   . High cholesterol   . Hypertension   . Kidney disease    CKD4, sees [place] [facility], considering dialysis  . Osteoarthritis 10/22/2012  . Pneumonia April 2014  . Shortness of breath dyspnea   . Stroke [facility]) 2012    Past Surgical History:  Procedure Laterality Date  . APPENDECTOMY  1984  . AV FISTULA PLACEMENT Right 05/21/2015   Procedure: Right Arm  Brachiocephalic ARTERIOVENOUS (AV) FISTULA CREATION;  Surgeon: Angelia Mould, MD;  Location: [facility];  Service: Vascular;  Laterality: Right;  . COLON SURGERY  2012   partial colon removed - twisted bowel  . CORONARY STENT PLACEMENT  1999   2 stents  . CYSTOSCOPY/RETROGRADE/URETEROSCOPY Bilateral 04/23/2014   Procedure: CYSTOSCOPY BILATERAL RETROGRADE,  LEFT URETEROSCOPY, LEFT STENT PLACEMENT;  Surgeon: Raynelle Bring, MD;  Location: [facility];  Service: Urology;  Laterality: Bilateral;  . HERNIA REPAIR     Right inguinal    Current Outpatient Prescriptions  Medication Sig Dispense Refill  . albuterol (VENTOLIN HFA) 108 (90 Base) MCG/ACT inhaler Inhale 2 puffs into the lungs every 4 (four) hours as needed for wheezing or shortness of breath. 1 Inhaler 11  . aspirin EC 325 MG tablet Take 1 tablet (325 mg total) by mouth daily. 30 tablet 0  . atorvastatin (LIPITOR) 20 MG tablet Take 1 tablet (20 mg total) by mouth daily. 30 tablet 9  . budesonide-formoterol (SYMBICORT) 160-4.5 MCG/ACT inhaler Inhale 2 puffs into the lungs 2 (two) times daily. 3 Inhaler 3  . calcium acetate (PHOSLO) 667 MG capsule Take 1 to 2 capsules by mouth with each meal or snack.    . cholecalciferol (VITAMIN D) 1000 units tablet Take 1,000 Units by mouth daily.    . cloNIDine (CATAPRES) 0.1 MG tablet Take 0.1 mg by mouth daily as needed.    . ethyl  chloride spray Use as directed on dialysis days.    [name] guaiFENesin (MUCINEX) 600 MG 12 hr tablet Take 600 mg by mouth 2 (two) times daily.    [name] ipratropium-albuterol (DUONEB) 0.5-2.5 (3) MG/3ML SOLN Take 3 mLs by nebulization every 4 (four) hours as needed.    . methocarbamol (ROBAXIN) 500 MG tablet Take 500 mg by mouth 2 (two) times daily as needed for muscle spasms.    . midodrine (PROAMATINE) 10 MG tablet Take 10 mg by mouth as needed.    . multivitamin (RENA-VIT) TABS tablet Take 1 tablet by mouth daily.    . OXYGEN 1 lpm with sleep only    . predniSONE (DELTASONE) 10 MG  tablet TAKE ONE TABLET BY MOUTH ONCE DAILY WITH BREAKFAST (Patient taking differently: 1/2 tablet daily) 30 tablet 2  . SPIRIVA HANDIHALER 18 MCG inhalation capsule Place 1 capsule into inhaler and inhale daily.    . metoprolol succinate (TOPROL-XL) 50 MG 24 hr tablet Take one tablet on non dialysis days and half tablet on dialysis days 30 tablet 11   No current facility-administered medications for this visit.     Allergies  Allergen Reactions  . Yellow Dyes (Non-Tartrazine) Other (See Comments)    Burns arms Cough medications (tussionex and benzonatate ok)  . Levaquin [Levofloxacin In D5w] Itching  . Tussionex Pennkinetic Er [Hydrocod Polst-Cpm Polst Er] Other (See Comments)    Unknown on MAR    Social History   Social History  . Marital status: Married    Spouse name: N/A  . Number of children: 3  . Years of education: N/A   Occupational History  . retired-worked in radio Retired   Social History Main Topics  . Smoking status: Former Smoker    Packs/day: 1.00    Years: 49.00    Types: Cigarettes, Pipe, Cigars    Quit date: 02/21/2000  . Smokeless tobacco: Never Used  . Alcohol use No  . Drug use: No  . Sexual activity: Not on file   Other Topics Concern  . Not on file   Social History Narrative   Work or School: retired [place] Situation: lives with wife in [place] Beliefs: [place] regular; poor diet             Family History  Problem Relation Age of Onset  . Cancer Mother     Breast  . Heart disease Father   . Heart attack Father   . Parkinson's disease Brother     Review of Systems:  As stated in the HPI and otherwise negative.   BP (!) 168/60 (BP Location: Left Arm, Patient Position: Sitting, Cuff Size: Normal)   Pulse 71   Ht 5\' 7"  (1.702 m)   Wt 158 lb 6.4 oz (71.8 kg)   SpO2 92%   BMI 24.81 kg/m   Physical Examination: General: Well developed, well nourished, NAD  HEENT: OP  clear, mucus membranes moist  SKIN: warm, dry. No rashes. Neuro: No focal deficits  Musculoskeletal: Muscle strength 5/5 all ext  Psychiatric: Mood and affect normal  Neck: No JVD, no carotid bruits, no thyromegaly, no lymphadenopathy.  Lungs:Clear bilaterally, no wheezes, rhonci, crackles Cardiovascular: Irreg irreg rate and rhythm. No murmurs, gallops or rubs. Abdomen:Soft. Bowel sounds present. Non-tender.  Extremities: Trace bilateral lower extremity edema. Pulses are 2 + in the bilateral DP/PT.  Echo 02/03/13: Left ventricle: The  cavity size was normal. Wall thickness was normal. Systolic function was normal. The estimated ejection fraction was in the range of 50% to 55%. Doppler parameters are consistent with abnormal left ventricular relaxation (grade 1 diastolic dysfunction).  EKG:  EKG is ordered today. The ekg ordered today demonstrates sinus, rate 71 bpm. 1st degree AV block.   Recent Labs: 08/19/2015: BUN 100; Creatinine, Ser 6.32; Hemoglobin 11.1; Platelets 298; Potassium 4.3; Sodium 130 04/28/2016: ALT 41 05/28/2016: TSH 4.77   Lipid Panel    Component Value Date/Time   CHOL 183 03/20/2015 1001   TRIG 91 06/08/2015 0016   HDL 34.40 (L) 03/20/2015 1001   CHOLHDL 5 03/20/2015 1001   VLDL 38.8 03/20/2015 1001   LDLCALC 109 (H) 03/20/2015 1001   LDLDIRECT 93.9 02/03/2013 0933     Wt Readings from Last 3 Encounters:  08/12/16 158 lb 6.4 oz (71.8 kg)  08/06/16 157 lb (71.2 kg)  07/21/16 157 lb (71.2 kg)     Other studies Reviewed: Additional studies/ records that were reviewed today include: . Review of the above records demonstrates:    Assessment and Plan:   1. CAD without angina. He has had no recent chest pains suggestive of angina. Continue ASA, beta blocker and statin.   2. HTN: BP well controlled. No changes.   3. Hyperlipidemia: Continue statin. Lipids followed in primary care. He will ask for this to be repeated when he goes back in next month for  thyroid testing.   4. Atrial fib, paroxysmal: He is in sinus today. He did not tolerate coumadin due to GI bleeding. He cannot take a NOAC with HD.  CHADS VASC Score of 6 so long term anti-coagulation would be favorable but at this time I do not think it is safe. He is tolerating ASA 325 mg daily with no change in bruising. He is tolerating Toprol. Will use Toprol 25 mg po the night before dialysis and 50 mg the other nights.   Current medicines are reviewed at length with the patient today.  The patient does not have concerns regarding medicines.  The following changes have been made:  no change  Labs/ tests ordered today include:   Orders Placed This Encounter  Procedures  . EKG 12-Lead    Disposition:   FU with me in 6 months  Signed, Lauree Chandler, MD 08/12/2016 10:46 AM    [facility] [address], [place], [place]  [address] Phone: [phone]; Fax: [phone]

## 2016-08-12 ENCOUNTER — Encounter: Payer: Self-pay | Admitting: Cardiovascular Disease

## 2016-08-12 ENCOUNTER — Ambulatory Visit (INDEPENDENT_AMBULATORY_CARE_PROVIDER_SITE_OTHER): Payer: Medicare PPO | Admitting: Cardiovascular Disease

## 2016-08-12 VITALS — BP 168/60 | HR 71 | Ht 67.0 in | Wt 158.4 lb

## 2016-08-12 DIAGNOSIS — I25110 Atherosclerotic heart disease of native coronary artery with unstable angina pectoris: Secondary | ICD-10-CM | POA: Diagnosis not present

## 2016-08-12 DIAGNOSIS — I10 Essential (primary) hypertension: Secondary | ICD-10-CM | POA: Diagnosis not present

## 2016-08-12 DIAGNOSIS — E7800 Pure hypercholesterolemia, unspecified: Secondary | ICD-10-CM

## 2016-08-12 DIAGNOSIS — I480 Paroxysmal atrial fibrillation: Secondary | ICD-10-CM

## 2016-08-12 MED ORDER — METOPROLOL SUCCINATE ER 50 MG PO TB24: ORAL_TABLET | ORAL | 11 refills | Status: DC

## 2016-08-12 NOTE — Patient Instructions (Signed)

## 2016-08-13 ENCOUNTER — Encounter: Payer: Self-pay | Admitting: Internal Medicine

## 2016-08-13 ENCOUNTER — Ambulatory Visit (INDEPENDENT_AMBULATORY_CARE_PROVIDER_SITE_OTHER): Payer: Medicare PPO | Admitting: Internal Medicine

## 2016-08-13 ENCOUNTER — Telehealth: Payer: Self-pay | Admitting: Internal Medicine

## 2016-08-13 VITALS — BP 124/60 | HR 72 | Temp 98.8°F | Ht 67.0 in | Wt 152.6 lb

## 2016-08-13 DIAGNOSIS — R058 Other specified cough: Secondary | ICD-10-CM | POA: Insufficient documentation

## 2016-08-13 DIAGNOSIS — R05 Cough: Secondary | ICD-10-CM | POA: Diagnosis not present

## 2016-08-13 DIAGNOSIS — J449 Chronic obstructive pulmonary disease, unspecified: Secondary | ICD-10-CM | POA: Diagnosis not present

## 2016-08-13 DIAGNOSIS — J9611 Chronic respiratory failure with hypoxia: Secondary | ICD-10-CM | POA: Diagnosis not present

## 2016-08-13 MED ORDER — AZITHROMYCIN 250 MG PO TABS: ORAL_TABLET | ORAL | 0 refills | Status: DC

## 2016-08-13 NOTE — Progress Notes (Signed)
Subjective:    Patient ID: Terry [name], male    DOB: [birth year]    MRN: [id number]   Brief patient profile:  35 yowm quit smoking 2001 bothered by doe seemed better then worse in 2010 p "lung infection" and stayed on advair/spiriva  Combo since 2012 seemed better then admitted in [facility] with pna 10/09/12  in then transferred to [facility] x 3 weeks and discharged  On May 1st  2014 referred to pulmonary clinic 11/29/12  by [facility] for copd eval and proved to have GOLD III copd 01/26/13 with symptoms much better off ACEi.    History of Present Illness 08/27/2015  Extended post hosp /transition of care  f/u ov/Terry [name] re: COPD GOLD III recurrent admits/ now chronic resp failure/HD dep CRI plus GOLD III rx symb/spriiva/neb saba Chief Complaint  Patient presents with  . Follow-up    Pt c/o occasional cough with white mucus. Pt denies wheeze/increased SOB/CP/tightness. Pt is on O2/2L, he states that on 3L it dries out his nose and burns. Pt also c/o low BP readings and wonders if some of his HTN meds need to be changed.   now sleeping in a recliner since admit 30-45 degrees due to sob/some noct cough but excess/ purulent sputum or mucus plugs   On prednisone with floor of   @ 10 mg since d/c 08/19/15  rec May need to consider adding Oracit to your regimen and I will le Dr Lorrene Reid know You will need to adjust your 02 to saturations of over 90%  Best fit and humidified 02 ordered  Plan A = Automatic = Symbicort 160 2 in am and 2 in pm with spiriva each am and prednisone 10 mg with breakfast daily  Work on inhaler technique:  Plan B = Backup Only use your albuterol (proventil) as a rescue medication Plan C = Crisis - only use your albuterol nebulizer if you first try Plan B and it fails to help > ok to use the nebulizer up to every 4 hours but if start needing it regularly call for immediate appointment   09/26/2015  f/u ov/Terry [name] re: COPD  GOLD  III on symbicort 160/spiriva and 02 2lpm 24/7  Chief  Complaint  Patient presents with  . Follow-up    Cough has improved some- requesting refill on tessalon.   doe/ weak x room to room  Prednisone 10 mg daily floor for now  No need for saba or neb  At this point  rec Prednisone 10 mg one on even and one-half on odd days x 2 weeks and if not change then reduce to 5 mg daily  Work on maintaining perfect inhaler technique:   Goal for 02 is 90% or above / ok adjust up  Or down or off as along as attaining this goal      01/07/2016  f/u ov/Terry [name] re:  Copd GOLD III/ rx 5 prednisone / symb/spiriva and rare saba  (maybe once a week)  02 hs only  Chief Complaint  Patient presents with  . Follow-up    pt states breathing is baseline since last OV. c/o sob & non prod cough.  02 1lpm hs rarely during the day / c/o dry mouth ? From spiriva dpi Doe = MMRC3 = can't walk 100 yards even at a slow pace at a flat grade s stopping due to sob   rec Plan A = Automatic = symbicort 160 Take 2 puffs first thing in am and then another  2 puffs about 12 hours later and spiriva 1.25 x 4 or 2.5 x 2puffs in am only  Plan B = Backup Only use your albuterol (ventolin) as a rescue medication  Plan C = Crisis - only use your albuterol nebulizer if you first try Plan B and it fails to help > ok to use the nebulizer up to every 4 hours but if start needing it regularly call for immediate appointment Prednisone 5 mg daily   Please schedule a follow up visit in 3 months but call sooner if needed        05/06/2016 acute extended ov/Terry [name] re:   Copd flare on pred 10 one half daily insists he's not on amiodarone  Chief Complaint  Patient presents with  . Acute Visit    Pt c/o increased cough and wheezing for the past 10 days. His cough is prod with yellow sputum.    onset was 10 days prior to OV  Onset with sore throat ? Has flutter/ very confused again re details of care   >>zpack / pred     05/07/2016 NP Follow up : COPD  Pt returns for follow up for med review. We  reviewed all his medications organize them into a medication count with patient education. Several medications are not brought today. However, patient says he has them at home. was seen 05/06/16 for COPD exacerbation, started on a Z-Pak and prednisone burst. Patient has started Z-Pak and says that he is starting to feel better. Unfortunately he did not start the prednisone   rec Follow med calendar closely and bring to each visit.  Taper prednisone as below : 4 tabs for 2 days, 2 tabs daily for  2 days, then 1 tab daily for 2 days , then 1/2 tab daily .     05/22/2016  f/u ov/Terry [name] re: COPD GOLDI III/ steroid dep / 02 dep/ has med cal with multiple erroneous edits he's made with wrong meds in wrong columns and note did not bring all meds in as requested for med reconciliation/ med calendar Chief Complaint  Patient presents with  . Follow-up    Medication review.   does HD MWF  On 02 24/7  1lpm  on conc and 2lpm on POC  Some cough/ nasal and chest congestion improved with pred burst and taper to 5 mg daily Min need for saba on sym/spiriva   rec Ok for stuffy nose to use nasal saline as needed as per med calendar Work on perfecting  inhaler technique:  See calendar for specific medication instructions and bring it back for each and every office visit     07/21/2016  f/u ov/Terry [name] re:  GOLD III/ symbicort and spriva dpi / no med calendar Chief Complaint  Patient presents with  . Follow-up    Breathing is doing well. He has not needed any albuterol.    nasal drainage symptoms xyears do no wake him up, few coughs and it's gone each am    rec Try off spiriva (think of it like high octane fuel) See calendar for specific medication instructions > If not doing well with your medications as they are listed on the med calendar, first see Tammy NP with the calendar we made today and all you medications in 2 bags, your automatics vs your as neededs     08/13/2016  f/u ov/Terry [name] re: COPD III/ has med  calendar maint symb/spriiva dpi still in use  Chief Complaint  Patient presents with  .  Acute Visit    Increased cough x for the past 10 days- prod with white to yellow sputum.     pred still at 10 mg one half each am  Cough worse at hs  But also daytime mostly dry, min mucus and no change in sob or need for saba despite flare   No obvious day to day or daytime variability or assoc   mucus plugs or hemoptysis or cp or chest tightness, subjective wheeze or overt sinus or hb symptoms. No unusual exp hx or h/o childhood pna/ asthma or knowledge of premature birth.  Sleeping ok p delsym (not on med list) without nocturnal  or early am exacerbation  of respiratory  c/o's or need for noct saba. Also denies any obvious fluctuation of symptoms with weather or environmental changes or other aggravating or alleviating factors except as outlined above   Current Medications, Allergies, Complete Past Medical History, Past Surgical History, Family History, and Social History were reviewed in [employer] record.  ROS  The following are not active complaints unless bolded sore throat, dysphagia, dental problems, itching, sneezing,  nasal congestion or excess/ purulent secretions, ear ache,   fever, chills, sweats, unintended wt loss, classically pleuritic or exertional cp,  orthopnea pnd or leg swelling, presyncope, palpitations, abdominal pain, anorexia, nausea, vomiting, diarrhea  or change in bowel or bladder habits, change in stools or urine, dysuria,hematuria,  rash, arthralgias, visual complaints, headache, numbness, weakness or ataxia or problems with walking or coordination,  change in mood/affect or memory.                         Objective:   Physical Exam  Somber amb wm nad    02/26/2015          210 >  08/27/2015 163 p HD > 09/26/2015  159 > 11/26/2015  160 > 01/07/2016  159 > 04/07/2016  159 > 05/06/2016>   07/21/2016  157 > 08/13/2016  152     Vital signs reviewed   -  -  Note on arrival 02 sats  94% on RA      HEENT: nl dentition, and oropharynx. Nl external ear canals without cough reflex - mild  bilateral non-specific turbinate edema s purulent discharge     NECK :  without JVD/Nodes/TM/ nl carotid upstrokes bilaterally   LUNGS: no acc muscle use,  Moderately Barrel chest, decreased BS in bases bilaterally but clear of wheezes/ rhonchi   CV:  RRR  no s3 or murmur or increase in P2, no edema   ABD:  soft and nontender with nl inspiratory excursion in the supine position. No bruits or organomegaly, bowel sounds nl  MS:  Nl gait/ ext warm without deformities, calf tenderness, cyanosis or clubbing No obvious joint restrictions   SKIN: warm and dry without lesions    NEURO:  alert, approp, nl sensorium with  no motor deficits      I personally reviewed images and agree with radiology impression as follows:  CXR:   05/06/16 COPD with chronic bibasilar atelectasis/ scarring. No change from prior studies.

## 2016-08-13 NOTE — Patient Instructions (Addendum)
zpak and stop spiriva ( it doesn't treat cough, it's for reduced exercise tolerance)   For rattling cough use mucine dm up to 1200 mg every 12 hours and for dry cough delsym  And use the flutter valve as much as you can.  If still coughing add pepcid 20 mg about 30-60 min before bedtime  (over the counter)  GERD (REFLUX)  is an extremely common cause of respiratory symptoms just like yours , many times with no obvious heartburn at all.    It can be treated with medication, but also with lifestyle changes including elevation of the head of your bed (ideally with 6 inch  bed blocks),  Smoking cessation, avoidance of late meals, excessive alcohol, and avoid fatty foods, chocolate, peppermint, colas, red wine, and acidic juices such as orange juice.  NO MINT OR MENTHOL PRODUCTS SO NO COUGH DROPS   USE SUGARLESS CANDY INSTEAD (Jolley ranchers or Stover's or Life Savers) or even ice chips will also do - the key is to swallow to prevent all throat clearing. NO OIL BASED VITAMINS - use powdered substitutes.    See Tammy NP  In 6 weeks with all your medications, even over the counter meds, separated in two separate bags, the ones you take no matter what vs the ones you stop once you feel better and take only as needed when you feel you need them.   Tammy  will generate for you a new user friendly medication calendar that will put [place] all on the same page re: your medication use.     Without this process, it simply isn't possible to assure that we are providing  your outpatient care  with  the attention to detail we feel you deserve.   If we cannot assure that you're getting that kind of care,  then we cannot manage your problem effectively from this clinic.  Once you have seen Tammy and we are sure that we're all on the same page with your medication use she will arrange follow up with me.

## 2016-08-13 NOTE — Telephone Encounter (Signed)
Ignore msg. Pt coming in for an appoint.Terry [name]'

## 2016-08-14 ENCOUNTER — Other Ambulatory Visit: Payer: Self-pay | Admitting: Internal Medicine

## 2016-08-14 ENCOUNTER — Telehealth: Payer: Self-pay | Admitting: Internal Medicine

## 2016-08-14 ENCOUNTER — Encounter: Payer: Self-pay | Admitting: Internal Medicine

## 2016-08-14 MED ORDER — PREDNISONE 10 MG PO TABS: 5.0000 mg | ORAL_TABLET | Freq: Every day | ORAL | 6 refills | Status: DC

## 2016-08-14 NOTE — Assessment & Plan Note (Signed)
Flared 08/12/16 > rx zpak then add pepcid 20 mg at bedtime / flutter valve and d/c dpi spiriva   Upper airway cough syndrome (previously labeled PNDS) , is  so named because it's frequently impossible to sort out how much is  CR/sinusitis with freq throat clearing (which can be related to primary GERD)   vs  causing  secondary (" extra esophageal")  GERD from wide swings in gastric pressure that occur with throat clearing, often  promoting self use of mint and menthol lozenges that reduce the lower esophageal sphincter tone and exacerbate the problem further in a cyclical fashion.   These are the same pts (now being labeled as having "irritable larynx syndrome" by some cough centers) who not infrequently have a history of having failed to tolerate ace inhibitors,  dry powder inhalers like spiriva  Or biiphosphonates or report having atypical/extraesophageal reflux symptoms that don't respond to standard doses of PPI  and are easily confused as having aecopd or asthma flares by even experienced allergists/ pulmonologists (myself included).

## 2016-08-14 NOTE — Assessment & Plan Note (Signed)
Trial off ACEi 11/30/2012 >>> much better symptom control - 05/28/2014   Walked RA x one lap @ 185 stopped due to  [employer] / sats ok/ mod pace - PFT's 01/26/13      FEV1  1.28 (48%) ratio 48 and no better p B2,  DLCO 39 corrects to 48%  - PFTs 02/26/2015   FEV1 0.99  (35 % ) ratio 35  p 11 % improvement from saba with DLCO  24 % corrects to 35  % for alv volume  p am symb/spiriva    - Pred maint rx  Since d/c 08/14/15  - 09/26/2015  extensive coaching HFA effectiveness =    75%  - 11/26/2015 rec reduce pred to 5 mg daily   - 05/22/2016  After extensive coaching HFA effectiveness =  90%  - 07/21/2016 try off spiriva due to throat irritation > restarted at rec of wife during cough but did not help it so d/c'd again 08/13/2016 with plan to change to spriva smi if breathing worse    Despite cough >  Adequate control on present rx, reviewed in detail with pt > no change in rx needed    I had an extended discussion with the patient reviewing all relevant studies completed to date and  lasting 15 to 20 minutes of a 25 minute visit    Each maintenance medication was reviewed in detail including most importantly the difference between maintenance and prns and under what circumstances the prns are to be triggered using an action plan format that is not reflected in the computer generated alphabetically organized AVS but trather by a customized med calendar that reflects the AVS meds with confirmed 100% correlation.   In addition, Please see AVS for unique instructions that I personally wrote and verbalized to the the pt in detail and then reviewed with pt  by my nurse highlighting any  changes in therapy recommended at today's visit to their plan of care.

## 2016-08-14 NOTE — Telephone Encounter (Signed)
Called and spoke with pt and he is aware of refill that has been sent to the pharmacy.  Nothing further is needed.  

## 2016-08-14 NOTE — Assessment & Plan Note (Signed)
02 rx as of  hosp discharge 08/14/15   As of 08/13/2016 =  1lpm at home/ 2lpm POC prn daytime   Adequate control on present rx, reviewed in detail with pt > no change in rx needed

## 2016-09-21 ENCOUNTER — Other Ambulatory Visit: Payer: Self-pay | Admitting: *Deleted

## 2016-09-21 ENCOUNTER — Telehealth: Payer: Self-pay | Admitting: Internal Medicine

## 2016-09-21 DIAGNOSIS — Z992 Dependence on renal dialysis: Secondary | ICD-10-CM | POA: Diagnosis not present

## 2016-09-21 DIAGNOSIS — R05 Cough: Secondary | ICD-10-CM

## 2016-09-21 DIAGNOSIS — R058 Other specified cough: Secondary | ICD-10-CM

## 2016-09-21 DIAGNOSIS — N186 End stage renal disease: Secondary | ICD-10-CM | POA: Diagnosis not present

## 2016-09-21 MED ORDER — AZITHROMYCIN 250 MG PO TABS: ORAL_TABLET | ORAL | 0 refills | Status: DC

## 2016-09-21 MED ORDER — ATORVASTATIN CALCIUM 20 MG PO TABS: 20.0000 mg | ORAL_TABLET | Freq: Every day | ORAL | 2 refills | Status: DC

## 2016-09-21 NOTE — Telephone Encounter (Signed)
Spoke with the pt and notified of recs per CDY  He verbalized understanding and rx sent to [facility]

## 2016-09-21 NOTE — Telephone Encounter (Signed)
Ok Z pak   250 mg, # 6, 2 today then one daily

## 2016-09-21 NOTE — Telephone Encounter (Signed)
Pt requesting a zpak for possible URI. Pt c/o cough, chest congestion with white-yellow mucus production and SOB x 5-6 days.  Pt using Mucinex OTC for his symptoms.  Pt requests a call back on his wife's phone # [phone]-- pt has Dyalisis and will not be able to answer.  Please advise Dr Annamaria Boots as Dr Melvyn Novas is not here this week. Thanks.   Allergies as of 09/21/2016      Reactions   Yellow Dyes (non-tartrazine) Other (See Comments)   Burns arms Cough medications (tussionex and benzonatate ok)   Levaquin [levofloxacin In D5w] Itching   Tussionex Pennkinetic Er [hydrocod Polst-cpm Polst Er] Other (See Comments)   Unknown on [facility]      Medication List       Accurate as of 09/21/16 10:43 AM. Always use your most recent med list.          albuterol 108 (90 Base) MCG/ACT inhaler Commonly known as:  VENTOLIN HFA Inhale 2 puffs into the lungs every 4 (four) hours as needed for wheezing or shortness of breath.   aspirin EC 325 MG tablet Take 1 tablet (325 mg total) by mouth daily.   atorvastatin 20 MG tablet Commonly known as:  LIPITOR Take 1 tablet (20 mg total) by mouth daily.   azithromycin 250 MG tablet Commonly known as:  ZITHROMAX Take 2 on day one then 1 daily x 4 days   budesonide-formoterol 160-4.5 MCG/ACT inhaler Commonly known as:  SYMBICORT Inhale 2 puffs into the lungs 2 (two) times daily.   calcium acetate 667 MG capsule Commonly known as:  PHOSLO Take 1 to 2 capsules by mouth with each meal or snack.   cholecalciferol 1000 units tablet Commonly known as:  VITAMIN D Take 1,000 Units by mouth daily.   cloNIDine 0.1 MG tablet Commonly known as:  CATAPRES Take 0.1 mg by mouth daily as needed.   ethyl chloride spray Use as directed on dialysis days.   guaiFENesin 600 MG 12 hr tablet Commonly known as:  MUCINEX Take 600 mg by mouth 2 (two) times daily.   ipratropium-albuterol 0.5-2.5 (3) MG/3ML Soln Commonly known as:  DUONEB Take 3 mLs by nebulization  every 4 (four) hours as needed.   methocarbamol 500 MG tablet Commonly known as:  ROBAXIN Take 500 mg by mouth 2 (two) times daily as needed for muscle spasms.   metoprolol succinate 50 MG 24 hr tablet Commonly known as:  TOPROL-XL Take one tablet on non dialysis days and half tablet on dialysis days   midodrine 10 MG tablet Commonly known as:  PROAMATINE Take 10 mg by mouth as needed.   multivitamin Tabs tablet Take 1 tablet by mouth daily.   OXYGEN 1 lpm with sleep only   predniSONE 10 MG tablet Commonly known as:  DELTASONE Take 0.5 tablets (5 mg total) by mouth daily with breakfast.

## 2016-09-23 ENCOUNTER — Emergency Department (HOSPITAL_COMMUNITY): Payer: Medicare PPO

## 2016-09-23 ENCOUNTER — Encounter (HOSPITAL_COMMUNITY): Payer: Self-pay | Admitting: Emergency Medicine

## 2016-09-23 ENCOUNTER — Inpatient Hospital Stay (HOSPITAL_COMMUNITY)
Admission: EM | Admit: 2016-09-23 | Discharge: 2016-09-27 | DRG: 193 | Disposition: A | Discharge status: Home / Self Care | Payer: Medicare PPO | Attending: Internal Medicine | Admitting: Internal Medicine

## 2016-09-23 DIAGNOSIS — E0580 Other thyrotoxicosis without thyrotoxic crisis or storm: Secondary | ICD-10-CM

## 2016-09-23 DIAGNOSIS — Z803 Family history of malignant neoplasm of breast: Secondary | ICD-10-CM

## 2016-09-23 DIAGNOSIS — Z888 Allergy status to other drugs, medicaments and biological substances status: Secondary | ICD-10-CM | POA: Diagnosis not present

## 2016-09-23 DIAGNOSIS — D631 Anemia in chronic kidney disease: Secondary | ICD-10-CM | POA: Diagnosis present

## 2016-09-23 DIAGNOSIS — R634 Abnormal weight loss: Secondary | ICD-10-CM | POA: Diagnosis present

## 2016-09-23 DIAGNOSIS — I480 Paroxysmal atrial fibrillation: Secondary | ICD-10-CM | POA: Diagnosis present

## 2016-09-23 DIAGNOSIS — R0602 Shortness of breath: Secondary | ICD-10-CM | POA: Diagnosis present

## 2016-09-23 DIAGNOSIS — N2581 Secondary hyperparathyroidism of renal origin: Secondary | ICD-10-CM | POA: Diagnosis present

## 2016-09-23 DIAGNOSIS — Z8614 Personal history of Methicillin resistant Staphylococcus aureus infection: Secondary | ICD-10-CM | POA: Diagnosis not present

## 2016-09-23 DIAGNOSIS — Z8249 Family history of ischemic heart disease and other diseases of the circulatory system: Secondary | ICD-10-CM | POA: Diagnosis not present

## 2016-09-23 DIAGNOSIS — R000 Tachycardia, unspecified: Secondary | ICD-10-CM | POA: Diagnosis present

## 2016-09-23 DIAGNOSIS — I120 Hypertensive chronic kidney disease with stage 5 chronic kidney disease or end stage renal disease: Secondary | ICD-10-CM | POA: Diagnosis present

## 2016-09-23 DIAGNOSIS — Z8673 Personal history of transient ischemic attack (TIA), and cerebral infarction without residual deficits: Secondary | ICD-10-CM | POA: Diagnosis not present

## 2016-09-23 DIAGNOSIS — Z820 Family history of epilepsy and other diseases of the nervous system: Secondary | ICD-10-CM | POA: Diagnosis not present

## 2016-09-23 DIAGNOSIS — Z809 Family history of malignant neoplasm, unspecified: Secondary | ICD-10-CM

## 2016-09-23 DIAGNOSIS — J1000 Influenza due to other identified influenza virus with unspecified type of pneumonia: Secondary | ICD-10-CM | POA: Diagnosis present

## 2016-09-23 DIAGNOSIS — J441 Chronic obstructive pulmonary disease with (acute) exacerbation: Secondary | ICD-10-CM | POA: Diagnosis present

## 2016-09-23 DIAGNOSIS — N186 End stage renal disease: Secondary | ICD-10-CM

## 2016-09-23 DIAGNOSIS — Z881 Allergy status to other antibiotic agents status: Secondary | ICD-10-CM | POA: Diagnosis not present

## 2016-09-23 DIAGNOSIS — J101 Influenza due to other identified influenza virus with other respiratory manifestations: Secondary | ICD-10-CM | POA: Diagnosis present

## 2016-09-23 DIAGNOSIS — I2510 Atherosclerotic heart disease of native coronary artery without angina pectoris: Secondary | ICD-10-CM | POA: Diagnosis present

## 2016-09-23 DIAGNOSIS — Z7951 Long term (current) use of inhaled steroids: Secondary | ICD-10-CM | POA: Diagnosis not present

## 2016-09-23 DIAGNOSIS — Z87891 Personal history of nicotine dependence: Secondary | ICD-10-CM

## 2016-09-23 DIAGNOSIS — Z22322 Carrier or suspected carrier of Methicillin resistant Staphylococcus aureus: Secondary | ICD-10-CM

## 2016-09-23 DIAGNOSIS — Y95 Nosocomial condition: Secondary | ICD-10-CM | POA: Diagnosis present

## 2016-09-23 DIAGNOSIS — Z7952 Long term (current) use of systemic steroids: Secondary | ICD-10-CM

## 2016-09-23 DIAGNOSIS — J440 Chronic obstructive pulmonary disease with acute lower respiratory infection: Secondary | ICD-10-CM | POA: Diagnosis present

## 2016-09-23 DIAGNOSIS — R0902 Hypoxemia: Secondary | ICD-10-CM

## 2016-09-23 DIAGNOSIS — Z91048 Other nonmedicinal substance allergy status: Secondary | ICD-10-CM

## 2016-09-23 DIAGNOSIS — Z7982 Long term (current) use of aspirin: Secondary | ICD-10-CM | POA: Diagnosis not present

## 2016-09-23 DIAGNOSIS — M1990 Unspecified osteoarthritis, unspecified site: Secondary | ICD-10-CM | POA: Diagnosis present

## 2016-09-23 DIAGNOSIS — Z955 Presence of coronary angioplasty implant and graft: Secondary | ICD-10-CM

## 2016-09-23 DIAGNOSIS — I10 Essential (primary) hypertension: Secondary | ICD-10-CM | POA: Diagnosis not present

## 2016-09-23 DIAGNOSIS — J9601 Acute respiratory failure with hypoxia: Secondary | ICD-10-CM | POA: Diagnosis not present

## 2016-09-23 DIAGNOSIS — J9621 Acute and chronic respiratory failure with hypoxia: Secondary | ICD-10-CM | POA: Diagnosis present

## 2016-09-23 DIAGNOSIS — E8770 Fluid overload, unspecified: Secondary | ICD-10-CM | POA: Diagnosis not present

## 2016-09-23 DIAGNOSIS — J9611 Chronic respiratory failure with hypoxia: Secondary | ICD-10-CM | POA: Diagnosis not present

## 2016-09-23 DIAGNOSIS — J189 Pneumonia, unspecified organism: Secondary | ICD-10-CM | POA: Diagnosis not present

## 2016-09-23 DIAGNOSIS — Z9981 Dependence on supplemental oxygen: Secondary | ICD-10-CM | POA: Diagnosis not present

## 2016-09-23 DIAGNOSIS — E8889 Other specified metabolic disorders: Secondary | ICD-10-CM | POA: Diagnosis present

## 2016-09-23 DIAGNOSIS — Z992 Dependence on renal dialysis: Secondary | ICD-10-CM

## 2016-09-23 DIAGNOSIS — Z1624 Resistance to multiple antibiotics: Secondary | ICD-10-CM | POA: Diagnosis present

## 2016-09-23 HISTORY — DX: Influenza due to unidentified influenza virus with other respiratory manifestations: J11.1

## 2016-09-23 LAB — I-STAT VENOUS BLOOD GAS, ED
ACID-BASE EXCESS: 8 mmol/L — AB (ref 0.0–2.0)
BICARBONATE: 37.4 mmol/L — AB (ref 20.0–28.0)
O2 Saturation: 38 %
PCO2 VEN: 67.3 mmHg — AB (ref 44.0–60.0)
PH VEN: 7.353 (ref 7.250–7.430)
PO2 VEN: 24 mmHg — AB (ref 32.0–45.0)
TCO2: 39 mmol/L (ref 0–100)

## 2016-09-23 LAB — BASIC METABOLIC PANEL
ANION GAP: 13 (ref 5–15)
BUN: 22 mg/dL — ABNORMAL HIGH (ref 6–20)
CHLORIDE: 91 mmol/L — AB (ref 101–111)
CO2: 30 mmol/L (ref 22–32)
Calcium: 7.8 mg/dL — ABNORMAL LOW (ref 8.9–10.3)
Creatinine, Ser: 3.11 mg/dL — ABNORMAL HIGH (ref 0.61–1.24)
GFR calc Af Amer: 20 mL/min — ABNORMAL LOW (ref >60)
GFR calc non Af Amer: 18 mL/min — ABNORMAL LOW (ref >60)
Glucose, Bld: 114 mg/dL — ABNORMAL HIGH (ref 65–99)
POTASSIUM: 3.5 mmol/L (ref 3.5–5.1)
SODIUM: 134 mmol/L — AB (ref 135–145)

## 2016-09-23 LAB — CBC
HEMATOCRIT: 36.3 % — AB (ref 39.0–52.0)
HEMOGLOBIN: 11.6 g/dL — AB (ref 13.0–17.0)
MCH: 32.4 pg (ref 26.0–34.0)
MCHC: 32 g/dL (ref 30.0–36.0)
MCV: 101.4 fL — AB (ref 78.0–100.0)
Platelets: 195 10*3/uL (ref 150–400)
RBC: 3.58 MIL/uL — AB (ref 4.22–5.81)
RDW: 16.4 % — ABNORMAL HIGH (ref 11.5–15.5)
WBC: 10.7 10*3/uL — AB (ref 4.0–10.5)

## 2016-09-23 LAB — I-STAT CG4 LACTIC ACID, ED: LACTIC ACID, VENOUS: 1.64 mmol/L (ref 0.5–1.9)

## 2016-09-23 LAB — I-STAT TROPONIN, ED
TROPONIN I, POC: 0.07 ng/mL (ref 0.00–0.08)
Troponin i, poc: 0.05 ng/mL (ref 0.00–0.08)

## 2016-09-23 MED ORDER — VANCOMYCIN HCL 10 G IV SOLR
1250.0000 mg | INTRAVENOUS | Status: DC
  Filled 2016-09-23: qty 1250

## 2016-09-23 MED ORDER — VANCOMYCIN HCL 10 G IV SOLR
1500.0000 mg | INTRAVENOUS | Status: AC
  Administered 2016-09-23: 1500 mg via INTRAVENOUS
  Filled 2016-09-23: qty 1500

## 2016-09-23 MED ORDER — ALBUTEROL (5 MG/ML) CONTINUOUS INHALATION SOLN
10.0000 mg/h | INHALATION_SOLUTION | Freq: Once | RESPIRATORY_TRACT | Status: AC
  Administered 2016-09-24: 10 mg/h via RESPIRATORY_TRACT
  Filled 2016-09-23: qty 20

## 2016-09-23 MED ORDER — IPRATROPIUM BROMIDE 0.02 % IN SOLN
1.0000 mg | Freq: Once | RESPIRATORY_TRACT | Status: AC
  Administered 2016-09-24: 1 mg via RESPIRATORY_TRACT
  Filled 2016-09-23: qty 5

## 2016-09-23 MED ORDER — PIPERACILLIN-TAZOBACTAM 3.375 G IVPB 30 MIN
3.3750 g | INTRAVENOUS | Status: AC
  Administered 2016-09-23: 3.375 g via INTRAVENOUS
  Filled 2016-09-23: qty 50

## 2016-09-23 MED ORDER — ACETAMINOPHEN 325 MG PO TABS
650.0000 mg | ORAL_TABLET | Freq: Once | ORAL | Status: AC
Start: 2016-09-23 — End: 2016-09-23
  Administered 2016-09-23: 650 mg via ORAL
  Filled 2016-09-23: qty 2

## 2016-09-23 MED ORDER — METHYLPREDNISOLONE SODIUM SUCC 125 MG IJ SOLR
125.0000 mg | Freq: Once | INTRAMUSCULAR | Status: AC
Start: 2016-09-23 — End: 2016-09-23
  Administered 2016-09-23: 125 mg via INTRAVENOUS
  Filled 2016-09-23: qty 2

## 2016-09-23 MED ORDER — IPRATROPIUM-ALBUTEROL 0.5-2.5 (3) MG/3ML IN SOLN
3.0000 mL | Freq: Once | RESPIRATORY_TRACT | Status: AC
  Administered 2016-09-23: 3 mL via RESPIRATORY_TRACT
  Filled 2016-09-23: qty 3

## 2016-09-23 NOTE — ED Notes (Signed)
RT called regarding pt breathing treatments.

## 2016-09-23 NOTE — ED Triage Notes (Addendum)
Patient reports worsening SOB with chest tightness and productive cough for several days , history of COPD/CAD, hemodialysis q Mon/Wed/Fri , he uses oxygen @ 2 LPM/[place] .

## 2016-09-23 NOTE — ED Provider Notes (Signed)
[facility] DEPT Provider Note   CSN: [id number] Arrival date & time: 09/23/16  2047     History   Chief Complaint Chief Complaint  Patient presents with  . Shortness of Breath    HPI Terry [name] is a 82 y.o. male.  HPI   82 year old male with past medical history as below including end-stage renal disease, COPD, chronic hypoxic respiratory failure, who presents with progressively worsening shortness of breath. The patient has reportedly had increased cough and shortness of breath over the last week. He has been taking azithromycin without significant improvement. He states he may have improved for approximately 24-48 hours but then had worsening of his shortness of breath. He was at dialysis today and the nurses were concerned about his breathing but he felt mildly improved after dialysis so he returned home. Since then, his breathing has significantly worsened. He is not dyspneic at rest. He has had uses oxygen all the time for the last 2 days, rather than at night only. He is also had a productive cough. No chest pain. He has had chills but no known fevers, though he has not checked his temperature regularly. He has been using his breathing treatments as prescribed. He does believe that he is mildly above his dry weight as well.  Past Medical History:  Diagnosis Date  . Acute and chronic respiratory failure 10/22/2012  . Atrial fibrillation ([place])   . CAD (coronary artery disease) [place], [place]  . COPD (chronic obstructive pulmonary disease) ([place])   . High cholesterol   . Hypertension   . Kidney disease    CKD4, sees [facility], considering dialysis  . Osteoarthritis 10/22/2012  . Pneumonia April 2014  . Shortness of breath dyspnea   . Stroke [facility]) 2012    Patient Active Problem List   Diagnosis Date Noted  . Upper airway cough syndrome 08/13/2016  . Memory loss of unknown cause 04/08/2016  . Chronic respiratory failure with hypoxia ([place]) 08/27/2015  . ESRD  (end stage renal disease) on dialysis ([place]) 07/28/2015  . Chronic obstructive pulmonary disease ([place])   . HCAP (healthcare-associated pneumonia)   . Palliative care encounter   . Skin excoriation 06/29/2015  . History of ETT   . Acute blood loss anemia   . Acute GI bleeding   . Pneumothorax, left   . Chronic diastolic heart failure ([place])   . Anemia of chronic disease   . End-stage renal disease on hemodialysis ([place])   . Pneumonia due to Enterobacter cloacae ([place])   . Pneumonia due to Klebsiella pneumoniae ([place])   . Encounter for orogastric (OG) tube placement   . Encounter for intubation   . Chest tube in place   . Acute hypoxemic respiratory failure ([place]) 06/07/2015  . Chronic atrial fibrillation ([place])   . ESRD (end stage renal disease) ([place])   . Acute respiratory failure with hypoxia ([place]) 05/15/2015  . Acute diastolic CHF (congestive heart failure) ([place]) 05/15/2015  . Acute on chronic kidney failure ([place]) 05/14/2015  . CAD (coronary artery disease) 02/04/2015  . COPD exacerbation ([place]) 05/16/2014  . Hyperkalemia 04/13/2014  . COPD GOLD III 10/22/2012  . CKD (chronic kidney disease) stage 4, GFR 15-29 ml/min (HCC) 10/22/2012  . CAD (coronary artery disease) of artery bypass graft 10/22/2012  . Other and unspecified hyperlipidemia 10/22/2012  . Osteoarthritis 10/22/2012  . Essential hypertension 10/22/2012    Past Surgical History:  Procedure Laterality Date  . APPENDECTOMY  1984  . AV FISTULA  PLACEMENT Right 05/21/2015   Procedure: Right Arm Brachiocephalic ARTERIOVENOUS (AV) FISTULA CREATION;  Surgeon: Angelia Mould, MD;  Location: [facility];  Service: Vascular;  Laterality: Right;  . COLON SURGERY  2012   partial colon removed - twisted bowel  . CORONARY STENT PLACEMENT  1999   2 stents  . CYSTOSCOPY/RETROGRADE/URETEROSCOPY Bilateral 04/23/2014   Procedure: CYSTOSCOPY BILATERAL RETROGRADE,  LEFT URETEROSCOPY, LEFT STENT PLACEMENT;  Surgeon: Raynelle Bring, MD;   Location: [facility];  Service: Urology;  Laterality: Bilateral;  . HERNIA REPAIR     Right inguinal       Home Medications    Prior to Admission medications   Medication Sig Start Date End Date Taking? Authorizing Provider  albuterol (VENTOLIN HFA) 108 (90 Base) MCG/ACT inhaler Inhale 2 puffs into the lungs every 4 (four) hours as needed for wheezing or shortness of breath. 08/27/15  Yes Tanda Rockers, MD  aspirin EC 325 MG tablet Take 1 tablet (325 mg total) by mouth daily. 09/26/15  Yes Burnell Blanks, MD  atorvastatin (LIPITOR) 20 MG tablet Take 1 tablet (20 mg total) by mouth daily. 09/21/16  Yes Burnell Blanks, MD  budesonide-formoterol [facility]) 160-4.5 MCG/ACT inhaler Inhale 2 puffs into the lungs 2 (two) times daily. 09/19/15  Yes Tanda Rockers, MD  calcium acetate (PHOSLO) 667 MG capsule Take 667-1,334 mg by mouth 3 (three) times daily with meals.  01/09/16  Yes Historical Provider, MD  cholecalciferol (VITAMIN D) 1000 units tablet Take 1,000 Units by mouth daily.   Yes Historical Provider, MD  guaiFENesin (MUCINEX) 600 MG 12 hr tablet Take 600 mg by mouth 2 (two) times daily.   Yes Historical Provider, MD  ipratropium-albuterol (DUONEB) 0.5-2.5 (3) MG/3ML SOLN Take 3 mLs by nebulization every 4 (four) hours as needed (for breathing).    Yes Historical Provider, MD  methocarbamol (ROBAXIN) 500 MG tablet Take 500 mg by mouth 2 (two) times daily as needed for muscle spasms.   Yes Historical Provider, MD  metoprolol succinate (TOPROL-XL) 50 MG 24 hr tablet Take one tablet on non dialysis days and half tablet on dialysis days Patient taking differently: Take 50 mg by mouth 2 (two) times daily.  08/12/16  Yes Burnell Blanks, MD  midodrine (PROAMATINE) 10 MG tablet Take 10 mg by mouth as needed.   Yes Historical Provider, MD  multivitamin (RENA-VIT) TABS tablet Take 1 tablet by mouth daily.   Yes Historical Provider, MD  predniSONE (DELTASONE) 10 MG tablet Take 0.5 tablets  (5 mg total) by mouth daily with breakfast. 08/14/16  Yes Tanda Rockers, MD  OXYGEN 1 lpm with sleep only    Historical Provider, MD    Family History Family History  Problem Relation Age of Onset  . Cancer Mother     Breast  . Heart disease Father   . Heart attack Father   . Parkinson's disease Brother     Social History Social History  Substance Use Topics  . Smoking status: Former Smoker    Packs/day: 1.00    Years: 49.00    Types: Cigarettes, Pipe, Cigars    Quit date: 02/21/2000  . Smokeless tobacco: Never Used  . Alcohol use No     Allergies   Yellow dyes (non-tartrazine); Levaquin [levofloxacin in d5w]; and Tussionex pennkinetic er [employer] er]   Review of Systems Review of Systems  Constitutional: Positive for appetite change, chills and fatigue. Negative for fever.  HENT: Negative for congestion and rhinorrhea.  Eyes: Negative for visual disturbance.  Respiratory: Positive for cough, shortness of breath and wheezing.   Cardiovascular: Negative for chest pain and leg swelling.  Gastrointestinal: Negative for abdominal pain, diarrhea, nausea and vomiting.  Genitourinary: Negative for dysuria and flank pain.  Musculoskeletal: Negative for neck pain and neck stiffness.  Skin: Negative for rash and wound.  Allergic/Immunologic: Negative for immunocompromised state.  Neurological: Positive for weakness. Negative for syncope and headaches.  All other systems reviewed and are negative.    Physical Exam Updated Vital Signs BP (!) 150/43   Pulse 81   Temp (S) 98.4 F (36.9 C) (Rectal)   Resp 20   SpO2 95%   Physical Exam  Constitutional: He is oriented to person, place, and time. He appears well-developed and well-nourished. He appears distressed.  HENT:  Head: Normocephalic and atraumatic.  Eyes: Conjunctivae are normal.  Neck: Neck supple. No JVD present.  Cardiovascular: Normal rate and normal heart sounds.  An irregularly irregular  rhythm present. Exam reveals no friction rub.   No murmur heard. Pulmonary/Chest: Effort normal. Tachypnea noted. No respiratory distress. He has decreased breath sounds. He has no wheezes. He has rhonchi in the right middle field, the right lower field, the left middle field and the left lower field.  Abdominal: He exhibits no distension.  Musculoskeletal: He exhibits no edema.  Neurological: He is alert and oriented to person, place, and time. He exhibits normal muscle tone.  Skin: Skin is warm. Capillary refill takes less than 2 seconds.  Psychiatric: He has a normal mood and affect.  Nursing note and vitals reviewed.    ED Treatments / Results  Labs (all labs ordered are listed, but only abnormal results are displayed) Labs Reviewed  BASIC METABOLIC PANEL - Abnormal; Notable for the following:       Result Value   Sodium 134 (*)    Chloride 91 (*)    Glucose, Bld 114 (*)    BUN 22 (*)    Creatinine, Ser 3.11 (*)    Calcium 7.8 (*)    GFR calc non Af Amer 18 (*)    GFR calc Af Amer 20 (*)    All other components within normal limits  CBC - Abnormal; Notable for the following:    WBC 10.7 (*)    RBC 3.58 (*)    Hemoglobin 11.6 (*)    HCT 36.3 (*)    MCV 101.4 (*)    RDW 16.4 (*)    All other components within normal limits  BRAIN NATRIURETIC PEPTIDE - Abnormal; Notable for the following:    B Natriuretic Peptide 1,438.4 (*)    All other components within normal limits  INFLUENZA PANEL BY PCR (TYPE A & B) - Abnormal; Notable for the following:    Influenza B By PCR POSITIVE (*)    All other components within normal limits  I-STAT VENOUS BLOOD GAS, ED - Abnormal; Notable for the following:    pCO2, Ven 67.3 (*)    pO2, Ven 24.0 (*)    Bicarbonate 37.4 (*)    Acid-Base Excess 8.0 (*)    All other components within normal limits  CULTURE, BLOOD (ROUTINE X 2)  CULTURE, BLOOD (ROUTINE X 2)  CULTURE, EXPECTORATED SPUTUM-ASSESSMENT  GRAM STAIN  HIV ANTIBODY (ROUTINE  TESTING)  STREP PNEUMONIAE URINARY ANTIGEN  CBC  BASIC METABOLIC PANEL  I-STAT TROPOININ, ED  I-STAT CG4 LACTIC ACID, ED  I-STAT TROPOININ, ED  I-STAT CG4 LACTIC ACID, ED  EKG  EKG Interpretation  Date/Time:  Wednesday September 23 2016 20:51:24 EDT Ventricular Rate:  79 PR Interval:  204 QRS Duration: 78 QT Interval:  366 QTC Calculation: 419 R Axis:   81 Text Interpretation:  Sinus rhythm with Premature atrial complexes Nonspecific ST abnormality Abnormal ECG Since last EKG Sinus rhythm appears to have replaced atrial fibrillation Confirmed by Chau Savell MD, Lysbeth Galas [phone]) on 09/23/2016 11:47:07 PM       Radiology Dg Chest 2 View  Result Date: 09/23/2016 CLINICAL DATA:  Shortness of breath EXAM: CHEST  2 VIEW COMPARISON:  Chest radiograph 05/06/2016 FINDINGS: The lungs remain hyperinflated. The heart size and mediastinal contours are within normal limits. Calcification within the aortic arch. Both lungs are clear. The visualized skeletal structures are unremarkable. IMPRESSION: No active cardiopulmonary disease. Unchanged calcific aortic atherosclerosis and findings of COPD. Electronically Signed   By: Ulyses Jarred M.D.   On: 09/23/2016 21:24    Procedures Procedures (including critical care time)  Medications Ordered in ED Medications  heparin injection 5,000 Units (not administered)  albuterol (PROVENTIL) (2.5 MG/3ML) 0.083% nebulizer solution 2.5 mg (not administered)  aspirin EC tablet 325 mg (not administered)  atorvastatin (LIPITOR) tablet 20 mg (not administered)  fluticasone furoate-vilanterol (BREO ELLIPTA) 200-25 MCG/INH 1 puff (not administered)  calcium acetate (PHOSLO) capsule 667-1,334 mg (not administered)  guaiFENesin (MUCINEX) 12 hr tablet 600 mg (not administered)  multivitamin (RENA-VIT) tablet 1 tablet (not administered)  methocarbamol (ROBAXIN) tablet 500 mg (not administered)  ipratropium-albuterol (DUONEB) 0.5-2.5 (3) MG/3ML nebulizer solution 3 mL (not  administered)  predniSONE (DELTASONE) tablet 40 mg (not administered)  ipratropium-albuterol (DUONEB) 0.5-2.5 (3) MG/3ML nebulizer solution 3 mL (3 mLs Nebulization Given by Other 09/23/16 2241)  methylPREDNISolone sodium succinate (SOLU-MEDROL) 125 mg/2 mL injection 125 mg (125 mg Intravenous Given 09/23/16 2307)  acetaminophen (TYLENOL) tablet 650 mg (650 mg Oral Given 09/23/16 2237)  piperacillin-tazobactam (ZOSYN) IVPB 3.375 g (0 g Intravenous Stopped 09/24/16 0012)  vancomycin (VANCOCIN) 1,500 mg in sodium chloride 0.9 % 500 mL IVPB (1,500 mg Intravenous New Bag/Given 09/23/16 2342)  albuterol (PROVENTIL,VENTOLIN) solution continuous neb (10 mg/hr Nebulization Given 09/24/16 0004)  ipratropium (ATROVENT) nebulizer solution 1 mg (1 mg Nebulization Given 09/24/16 0004)     Initial Impression / Assessment and Plan / ED Course  I have reviewed the triage vital signs and the nursing notes.  Pertinent labs & imaging results that were available during my care of the patient were reviewed by me and considered in my medical decision making (see chart for details).    82 year old male with extensive history as above including end-stage renal disease, COPD, and chronic hypoxic respiratory failure here with acute on chronic hypoxic respiratory failure in the setting of suspected pneumonia. On arrival, he is tachypnea with diminished aeration and bilateral rhonchi. Primary consideration is infectious etiology as he is febrile here, with underlying COPD exacerbation. He does not appear overtly hypervolemic, sat for full session of dialysis, and his chest x-ray is without edema. He also has no focal findings on his x-ray, but given his leukocytosis and fever, will cover empirically for possible early pneumonia. Otherwise, he has mild acute on chronic hypercapnia, but is mentating well. Patient given Solu-Medrol and continuous nebulizers here. Will admit to hospitalists. Pt improving with beathing tx. HR remains stable but  intermittently in AFib - has h/o same.  Final Clinical Impressions(s) / ED Diagnoses   Final diagnoses:  COPD exacerbation ([place])  HCAP (healthcare-associated pneumonia)  Hypoxia  AF (paroxysmal atrial  fibrillation) [place])    New Prescriptions New Prescriptions   No medications on file     Duffy Bruce, MD 09/24/16 [phone]

## 2016-09-24 ENCOUNTER — Encounter (HOSPITAL_COMMUNITY): Payer: Self-pay | Admitting: General Practice

## 2016-09-24 ENCOUNTER — Telehealth: Payer: Self-pay | Admitting: Internal Medicine

## 2016-09-24 DIAGNOSIS — I10 Essential (primary) hypertension: Secondary | ICD-10-CM

## 2016-09-24 DIAGNOSIS — R0602 Shortness of breath: Secondary | ICD-10-CM | POA: Diagnosis present

## 2016-09-24 DIAGNOSIS — Z8614 Personal history of Methicillin resistant Staphylococcus aureus infection: Secondary | ICD-10-CM | POA: Diagnosis not present

## 2016-09-24 DIAGNOSIS — Z803 Family history of malignant neoplasm of breast: Secondary | ICD-10-CM | POA: Diagnosis not present

## 2016-09-24 DIAGNOSIS — J441 Chronic obstructive pulmonary disease with (acute) exacerbation: Secondary | ICD-10-CM

## 2016-09-24 DIAGNOSIS — J9611 Chronic respiratory failure with hypoxia: Secondary | ICD-10-CM | POA: Diagnosis not present

## 2016-09-24 DIAGNOSIS — J440 Chronic obstructive pulmonary disease with acute lower respiratory infection: Secondary | ICD-10-CM | POA: Diagnosis present

## 2016-09-24 DIAGNOSIS — I120 Hypertensive chronic kidney disease with stage 5 chronic kidney disease or end stage renal disease: Secondary | ICD-10-CM | POA: Diagnosis present

## 2016-09-24 DIAGNOSIS — J189 Pneumonia, unspecified organism: Secondary | ICD-10-CM | POA: Diagnosis not present

## 2016-09-24 DIAGNOSIS — Z992 Dependence on renal dialysis: Secondary | ICD-10-CM | POA: Diagnosis not present

## 2016-09-24 DIAGNOSIS — N186 End stage renal disease: Secondary | ICD-10-CM

## 2016-09-24 DIAGNOSIS — Z22322 Carrier or suspected carrier of Methicillin resistant Staphylococcus aureus: Secondary | ICD-10-CM | POA: Diagnosis not present

## 2016-09-24 DIAGNOSIS — R0902 Hypoxemia: Secondary | ICD-10-CM | POA: Diagnosis not present

## 2016-09-24 DIAGNOSIS — Z881 Allergy status to other antibiotic agents status: Secondary | ICD-10-CM | POA: Diagnosis not present

## 2016-09-24 DIAGNOSIS — Z7951 Long term (current) use of inhaled steroids: Secondary | ICD-10-CM | POA: Diagnosis not present

## 2016-09-24 DIAGNOSIS — Z888 Allergy status to other drugs, medicaments and biological substances status: Secondary | ICD-10-CM | POA: Diagnosis not present

## 2016-09-24 DIAGNOSIS — Z820 Family history of epilepsy and other diseases of the nervous system: Secondary | ICD-10-CM | POA: Diagnosis not present

## 2016-09-24 DIAGNOSIS — Y95 Nosocomial condition: Secondary | ICD-10-CM | POA: Diagnosis present

## 2016-09-24 DIAGNOSIS — Z9981 Dependence on supplemental oxygen: Secondary | ICD-10-CM | POA: Diagnosis not present

## 2016-09-24 DIAGNOSIS — J1000 Influenza due to other identified influenza virus with unspecified type of pneumonia: Secondary | ICD-10-CM | POA: Diagnosis present

## 2016-09-24 DIAGNOSIS — D631 Anemia in chronic kidney disease: Secondary | ICD-10-CM | POA: Diagnosis present

## 2016-09-24 DIAGNOSIS — Z1624 Resistance to multiple antibiotics: Secondary | ICD-10-CM | POA: Diagnosis present

## 2016-09-24 DIAGNOSIS — Z8249 Family history of ischemic heart disease and other diseases of the circulatory system: Secondary | ICD-10-CM | POA: Diagnosis not present

## 2016-09-24 DIAGNOSIS — R000 Tachycardia, unspecified: Secondary | ICD-10-CM | POA: Diagnosis present

## 2016-09-24 DIAGNOSIS — J9601 Acute respiratory failure with hypoxia: Secondary | ICD-10-CM

## 2016-09-24 DIAGNOSIS — I480 Paroxysmal atrial fibrillation: Secondary | ICD-10-CM | POA: Diagnosis present

## 2016-09-24 DIAGNOSIS — J9621 Acute and chronic respiratory failure with hypoxia: Secondary | ICD-10-CM | POA: Diagnosis present

## 2016-09-24 DIAGNOSIS — Z8673 Personal history of transient ischemic attack (TIA), and cerebral infarction without residual deficits: Secondary | ICD-10-CM | POA: Diagnosis not present

## 2016-09-24 DIAGNOSIS — Z7982 Long term (current) use of aspirin: Secondary | ICD-10-CM | POA: Diagnosis not present

## 2016-09-24 DIAGNOSIS — N2581 Secondary hyperparathyroidism of renal origin: Secondary | ICD-10-CM | POA: Diagnosis present

## 2016-09-24 DIAGNOSIS — Z87891 Personal history of nicotine dependence: Secondary | ICD-10-CM | POA: Diagnosis not present

## 2016-09-24 LAB — BASIC METABOLIC PANEL
ANION GAP: 14 (ref 5–15)
BUN: 36 mg/dL — AB (ref 6–20)
CALCIUM: 7.8 mg/dL — AB (ref 8.9–10.3)
CO2: 27 mmol/L (ref 22–32)
Chloride: 96 mmol/L — ABNORMAL LOW (ref 101–111)
Creatinine, Ser: 4.44 mg/dL — ABNORMAL HIGH (ref 0.61–1.24)
GFR calc Af Amer: 13 mL/min — ABNORMAL LOW (ref >60)
GFR calc non Af Amer: 11 mL/min — ABNORMAL LOW (ref >60)
GLUCOSE: 214 mg/dL — AB (ref 65–99)
Potassium: 3.4 mmol/L — ABNORMAL LOW (ref 3.5–5.1)
Sodium: 137 mmol/L (ref 135–145)

## 2016-09-24 LAB — CBC
HEMATOCRIT: 32.6 % — AB (ref 39.0–52.0)
Hemoglobin: 10.2 g/dL — ABNORMAL LOW (ref 13.0–17.0)
MCH: 31.5 pg (ref 26.0–34.0)
MCHC: 31.3 g/dL (ref 30.0–36.0)
MCV: 100.6 fL — AB (ref 78.0–100.0)
Platelets: 176 10*3/uL (ref 150–400)
RBC: 3.24 MIL/uL — ABNORMAL LOW (ref 4.22–5.81)
RDW: 16.5 % — AB (ref 11.5–15.5)
WBC: 9.7 10*3/uL (ref 4.0–10.5)

## 2016-09-24 LAB — BRAIN NATRIURETIC PEPTIDE: B Natriuretic Peptide: 1438.4 pg/mL — ABNORMAL HIGH (ref 0.0–100.0)

## 2016-09-24 LAB — INFLUENZA PANEL BY PCR (TYPE A & B)
INFLAPCR: NEGATIVE
INFLBPCR: POSITIVE — AB

## 2016-09-24 LAB — I-STAT CG4 LACTIC ACID, ED: Lactic Acid, Venous: 1.08 mmol/L (ref 0.5–1.9)

## 2016-09-24 LAB — MRSA PCR SCREENING: MRSA BY PCR: POSITIVE — AB

## 2016-09-24 LAB — HIV ANTIBODY (ROUTINE TESTING W REFLEX): HIV Screen 4th Generation wRfx: NONREACTIVE

## 2016-09-24 MED ORDER — HEPARIN SODIUM (PORCINE) 5000 UNIT/ML IJ SOLN
5000.0000 [IU] | Freq: Three times a day (TID) | INTRAMUSCULAR | Status: DC
  Administered 2016-09-24 – 2016-09-27 (×10): 5000 [IU] via SUBCUTANEOUS
  Filled 2016-09-24 (×10): qty 1

## 2016-09-24 MED ORDER — VANCOMYCIN HCL IN DEXTROSE 750-5 MG/150ML-% IV SOLN
750.0000 mg | INTRAVENOUS | Status: DC
  Administered 2016-09-25: 750 mg via INTRAVENOUS
  Filled 2016-09-24: qty 150

## 2016-09-24 MED ORDER — ASPIRIN EC 325 MG PO TBEC
325.0000 mg | DELAYED_RELEASE_TABLET | Freq: Every day | ORAL | Status: DC
  Administered 2016-09-24 – 2016-09-27 (×4): 325 mg via ORAL
  Filled 2016-09-24 (×4): qty 1

## 2016-09-24 MED ORDER — GUAIFENESIN ER 600 MG PO TB12
600.0000 mg | ORAL_TABLET | Freq: Two times a day (BID) | ORAL | Status: DC
  Administered 2016-09-24 – 2016-09-27 (×8): 600 mg via ORAL
  Filled 2016-09-24 (×8): qty 1

## 2016-09-24 MED ORDER — ALBUTEROL SULFATE (2.5 MG/3ML) 0.083% IN NEBU: 2.5000 mg | INHALATION_SOLUTION | RESPIRATORY_TRACT | Status: DC | PRN

## 2016-09-24 MED ORDER — LEVALBUTEROL HCL 0.63 MG/3ML IN NEBU: 0.6300 mg | INHALATION_SOLUTION | Freq: Four times a day (QID) | RESPIRATORY_TRACT | Status: DC | PRN

## 2016-09-24 MED ORDER — METOPROLOL TARTRATE 5 MG/5ML IV SOLN: 5.0000 mg | INTRAVENOUS | Status: DC | PRN

## 2016-09-24 MED ORDER — OSELTAMIVIR PHOSPHATE 30 MG PO CAPS
30.0000 mg | ORAL_CAPSULE | ORAL | Status: DC
  Administered 2016-09-25: 30 mg via ORAL
  Filled 2016-09-24: qty 1

## 2016-09-24 MED ORDER — CALCIUM ACETATE (PHOS BINDER) 667 MG PO CAPS
667.0000 mg | ORAL_CAPSULE | Freq: Three times a day (TID) | ORAL | Status: DC
  Administered 2016-09-24 (×2): 667 mg via ORAL
  Administered 2016-09-24: 1334 mg via ORAL
  Administered 2016-09-25 – 2016-09-26 (×5): 667 mg via ORAL
  Administered 2016-09-27: 1334 mg via ORAL
  Filled 2016-09-24 (×7): qty 1
  Filled 2016-09-24: qty 2
  Filled 2016-09-24: qty 1
  Filled 2016-09-24: qty 2

## 2016-09-24 MED ORDER — DILTIAZEM HCL-DEXTROSE 100-5 MG/100ML-% IV SOLN (PREMIX)
5.0000 mg/h | INTRAVENOUS | Status: DC
  Administered 2016-09-24: 5 mg/h via INTRAVENOUS
  Filled 2016-09-24: qty 100

## 2016-09-24 MED ORDER — OSELTAMIVIR PHOSPHATE 75 MG PO CAPS: 75.0000 mg | ORAL_CAPSULE | Freq: Two times a day (BID) | ORAL | Status: DC

## 2016-09-24 MED ORDER — PREDNISONE 20 MG PO TABS
40.0000 mg | ORAL_TABLET | Freq: Every day | ORAL | Status: DC
  Administered 2016-09-24 – 2016-09-27 (×4): 40 mg via ORAL
  Filled 2016-09-24 (×4): qty 2

## 2016-09-24 MED ORDER — METOPROLOL SUCCINATE ER 50 MG PO TB24: 50.0000 mg | ORAL_TABLET | Freq: Two times a day (BID) | ORAL | Status: DC

## 2016-09-24 MED ORDER — DOXERCALCIFEROL 4 MCG/2ML IV SOLN
2.0000 ug | INTRAVENOUS | Status: DC
  Administered 2016-09-25: 2 ug via INTRAVENOUS
  Filled 2016-09-24: qty 2

## 2016-09-24 MED ORDER — PIPERACILLIN-TAZOBACTAM 3.375 G IVPB
3.3750 g | Freq: Two times a day (BID) | INTRAVENOUS | Status: DC
  Administered 2016-09-24 – 2016-09-25 (×4): 3.375 g via INTRAVENOUS
  Filled 2016-09-24 (×5): qty 50

## 2016-09-24 MED ORDER — FLUTICASONE FUROATE-VILANTEROL 200-25 MCG/INH IN AEPB
1.0000 | INHALATION_SPRAY | Freq: Every day | RESPIRATORY_TRACT | Status: DC
  Administered 2016-09-24 – 2016-09-27 (×3): 1 via RESPIRATORY_TRACT
  Filled 2016-09-24: qty 28

## 2016-09-24 MED ORDER — METOPROLOL TARTRATE 5 MG/5ML IV SOLN
5.0000 mg | INTRAVENOUS | Status: DC | PRN
  Administered 2016-09-26: 5 mg via INTRAVENOUS
  Filled 2016-09-24 (×2): qty 5

## 2016-09-24 MED ORDER — OSELTAMIVIR PHOSPHATE 30 MG PO CAPS
30.0000 mg | ORAL_CAPSULE | Freq: Once | ORAL | Status: AC
  Administered 2016-09-24: 30 mg via ORAL
  Filled 2016-09-24: qty 1

## 2016-09-24 MED ORDER — METHOCARBAMOL 500 MG PO TABS: 500.0000 mg | ORAL_TABLET | Freq: Two times a day (BID) | ORAL | Status: DC | PRN

## 2016-09-24 MED ORDER — NEPRO/CARBSTEADY PO LIQD
237.0000 mL | Freq: Two times a day (BID) | ORAL | Status: DC
  Administered 2016-09-24: 237 mL via ORAL
  Filled 2016-09-24 (×4): qty 237

## 2016-09-24 MED ORDER — ATORVASTATIN CALCIUM 20 MG PO TABS
20.0000 mg | ORAL_TABLET | Freq: Every day | ORAL | Status: DC
  Administered 2016-09-24 – 2016-09-26 (×3): 20 mg via ORAL
  Filled 2016-09-24 (×3): qty 1

## 2016-09-24 MED ORDER — RENA-VITE PO TABS
1.0000 | ORAL_TABLET | Freq: Every day | ORAL | Status: DC
  Administered 2016-09-24 – 2016-09-26 (×3): 1 via ORAL
  Filled 2016-09-24 (×3): qty 1

## 2016-09-24 MED ORDER — BOOST / RESOURCE BREEZE PO LIQD
1.0000 | Freq: Three times a day (TID) | ORAL | Status: DC
  Administered 2016-09-24 – 2016-09-26 (×2): 1 via ORAL

## 2016-09-24 MED ORDER — FUROSEMIDE 10 MG/ML IJ SOLN
40.0000 mg | Freq: Once | INTRAMUSCULAR | Status: AC
  Administered 2016-09-24: 40 mg via INTRAVENOUS
  Filled 2016-09-24: qty 4

## 2016-09-24 MED ORDER — IPRATROPIUM-ALBUTEROL 0.5-2.5 (3) MG/3ML IN SOLN: 3.0000 mL | RESPIRATORY_TRACT | Status: DC | PRN

## 2016-09-24 NOTE — Progress Notes (Signed)
82 year old male with past medical history of ESRD, COPD on home oxygen came to the ED with complaints of shortness of breath. Admitted for concerns of healthcare acquired pneumonia. 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 Currently he is on Zosyn. He is also getting treatment for COPD exacerbation with steroids, nebulizer treatments. Nephrology has been consulted and plans on dialyzing him tomorrow. His blood work came back positive for influenza therefore I will start him on Tamiflu. Earlier he was also in A. fib with RVR therefore Cardizem drip was started but now he is normal sinus rhythm therefore Cardizem drip has been stopped. He likely has history of paroxysmal atrial fibrillation. Monitor his electrolytes at this time and will order Lopressor 5 mg IV every 4 hours when necessary to be given for heart rate about 100 and to be held for hypotension. At home he is on 1 L of oxygen at home at night but for the past 2 or 3 days he has been using 2 L at night at home instead. Currently he is on 2 L nasal cannula and will maintain at that. Next  Case also discussed with the nursing staff along with plan of care. Please call with any further questions of necessary.

## 2016-09-24 NOTE — ED Notes (Signed)
RT at the bedside.

## 2016-09-24 NOTE — Progress Notes (Signed)
Pharmacy Antibiotic Note  Terry [name] is a 82 y.o. male admitted on 09/23/2016 with HCAP.  Pharmacy has been consulted for Vancomycin and Zosyn dosing. Pt with ESRD (HD MWF as o/p). Pt on azithromycin PTA.  Vanc 1500mg  IV and Zosyn 3.375gm IV given in ED ~2345  Plan: Zosyn 3.375gm IV q12h -  doses over 4 hours Vancomycin 750mg  IV with HD Will f/u micro data, renal function, and pt's clinical condition Vanc pre-HD level prn     Temp (24hrs), Avg:100 F (37.8 C), Min:98.4 F (36.9 C), Max:101.4 F (38.6 C)   Recent Labs Lab 09/23/16 2056 09/23/16 2329 09/24/16 0206  WBC 10.7*  --   --   CREATININE 3.11*  --   --   LATICACIDVEN  --  1.64 1.08    CrCl cannot be calculated (Unknown ideal weight.).    Allergies  Allergen Reactions  . Yellow Dyes (Non-Tartrazine) Other (See Comments)    Burns arms Cough medications (tussionex and benzonatate ok)  . Levaquin [Levofloxacin In D5w] Itching  . Tussionex Pennkinetic Er [Hydrocod Polst-Cpm Polst Er] Other (See Comments)    Unknown on MAR    Antimicrobials this admission: 4/4 Vanc >>  4/4 Zosyn >>   Dose adjustments this admission: n/a  Microbiology results: 4/4 BCx x2:   Thank you for allowing pharmacy to be a part of this patient's care.  Sherlon Handing, PharmD, BCPS Clinical pharmacist, pager [phone] 09/24/2016 3:35 AM

## 2016-09-24 NOTE — H&P (Addendum)
History and Physical    Terry [name] KYH:[id number] DOB: [birth year] DOA: 09/23/2016  PCP: [name], DO  Patient coming from: Home  I have personally briefly reviewed patient's old medical records in [place]  Chief Complaint: SOB  HPI: Terry [name] is a 82 y.o. male with medical history significant of ESRD, COPD wears O2 at night.  Patient presents to the ED with c/o SOB.  Patient reports increased cough and SOB over the past one week.  Azithromycin provides no relief.  Went to dialysis today where nurses were concerned about his breathing but felt somewhat better after dialysis and so went home.  Since then he has had significant worsening of breathing and has presented to the ED.  Does believe he is mildly above dry weight.  Cough is productive.  No chest pain, chills but no known fever.   ED Course: Tm 101.4, WBC 10.7.  Tachycardic to 120 initially, Has new O2 requirement, obvious wet sounding cough.  Despite all of this CXR is negative.  Given 125 solumedrol, neb treatments, zosyn and vanc by EDP for probably PNA with COPD exacerbation.   Review of Systems: As per HPI otherwise 10 point review of systems negative.   Past Medical History:  Diagnosis Date  . Acute and chronic respiratory failure 10/22/2012  . Atrial fibrillation ([place])   . CAD (coronary artery disease) [place], [place]  . COPD (chronic obstructive pulmonary disease) ([place])   . High cholesterol   . Hypertension   . Kidney disease    CKD4, sees [place] [facility], considering dialysis  . Osteoarthritis 10/22/2012  . Pneumonia April 2014  . Shortness of breath dyspnea   . Stroke [facility]) 2012    Past Surgical History:  Procedure Laterality Date  . APPENDECTOMY  1984  . AV FISTULA PLACEMENT Right 05/21/2015   Procedure: Right Arm Brachiocephalic ARTERIOVENOUS (AV) FISTULA CREATION;  Surgeon: Angelia Mould, MD;  Location: [facility];  Service: Vascular;  Laterality: Right;  . COLON SURGERY  2012   partial colon removed - twisted bowel  . CORONARY STENT PLACEMENT  1999   2 stents  . CYSTOSCOPY/RETROGRADE/URETEROSCOPY Bilateral 04/23/2014   Procedure: CYSTOSCOPY BILATERAL RETROGRADE,  LEFT URETEROSCOPY, LEFT STENT PLACEMENT;  Surgeon: Raynelle Bring, MD;  [facility];  Service: Urology;  Laterality: Bilateral;  . HERNIA REPAIR     Right inguinal     reports that he quit smoking about 16 years ago. His smoking use included Cigarettes, Pipe, and Cigars. He has a 49.00 pack-year smoking history. He has never used smokeless tobacco. He reports that he does not drink alcohol or use drugs.  Allergies  Allergen Reactions  . Yellow Dyes (Non-Tartrazine) Other (See Comments)    Burns arms Cough medications (tussionex and benzonatate ok)  . Levaquin [Levofloxacin In D5w] Itching  . Tussionex Pennkinetic Er [Hydrocod Polst-Cpm Polst Er] Other (See Comments)    Unknown on MAR    Family History  Problem Relation Age of Onset  . Cancer Mother     Breast  . Heart disease Father   . Heart attack Father   . Parkinson's disease Brother      Prior to Admission medications   Medication Sig Start Date End Date Taking? Authorizing Provider  albuterol (VENTOLIN HFA) 108 (90 Base) MCG/ACT inhaler Inhale 2 puffs into the lungs every 4 (four) hours as needed for wheezing or shortness of breath. 08/27/15  Yes Tanda Rockers, MD  aspirin EC 325  MG tablet Take 1 tablet (325 mg total) by mouth daily. 09/26/15  Yes Burnell Blanks, MD  atorvastatin (LIPITOR) 20 MG tablet Take 1 tablet (20 mg total) by mouth daily. 09/21/16  Yes Burnell Blanks, MD  budesonide-formoterol [facility]) 160-4.5 MCG/ACT inhaler Inhale 2 puffs into the lungs 2 (two) times daily. 09/19/15  Yes Tanda Rockers, MD  calcium acetate (PHOSLO) 667 MG capsule Take 667-1,334 mg by mouth 3 (three) times daily with meals.  01/09/16  Yes Historical Provider, MD  cholecalciferol (VITAMIN D) 1000 units tablet Take 1,000 Units by  mouth daily.   Yes Historical Provider, MD  guaiFENesin (MUCINEX) 600 MG 12 hr tablet Take 600 mg by mouth 2 (two) times daily.   Yes Historical Provider, MD  ipratropium-albuterol (DUONEB) 0.5-2.5 (3) MG/3ML SOLN Take 3 mLs by nebulization every 4 (four) hours as needed (for breathing).    Yes Historical Provider, MD  methocarbamol (ROBAXIN) 500 MG tablet Take 500 mg by mouth 2 (two) times daily as needed for muscle spasms.   Yes Historical Provider, MD  metoprolol succinate (TOPROL-XL) 50 MG 24 hr tablet Take one tablet on non dialysis days and half tablet on dialysis days Patient taking differently: Take 50 mg by mouth 2 (two) times daily.  08/12/16  Yes Burnell Blanks, MD  midodrine (PROAMATINE) 10 MG tablet Take 10 mg by mouth as needed.   Yes Historical Provider, MD  multivitamin (RENA-VIT) TABS tablet Take 1 tablet by mouth daily.   Yes Historical Provider, MD  predniSONE (DELTASONE) 10 MG tablet Take 0.5 tablets (5 mg total) by mouth daily with breakfast. 08/14/16  Yes Tanda Rockers, MD  OXYGEN 1 lpm with sleep only    Historical Provider, MD    Physical Exam: Vitals:   09/24/16 0115 09/24/16 0127 09/24/16 0130 09/24/16 0145  BP: (!) 138/40  (!) 148/45 (!) 150/43  Pulse: 73  80 81  Resp: 19  17 20   Temp:  (S) 98.4 F (36.9 C)    TempSrc:  (S) Rectal    SpO2: 100%  100% 95%    Constitutional: NAD, calm, comfortable Eyes: PERRL, lids and conjunctivae normal ENMT: Mucous membranes are moist. Posterior pharynx clear of any exudate or lesions.Normal dentition.  Neck: normal, supple, no masses, no thyromegaly Respiratory: Diffuse mild wheezing, coarse breath sounds in RLL Cardiovascular: Regular rate and rhythm, no murmurs / rubs / gallops. No extremity edema. 2+ pedal pulses. No carotid bruits.  Abdomen: no tenderness, no masses palpated. No hepatosplenomegaly. Bowel sounds positive.  Musculoskeletal: no clubbing / cyanosis. No joint deformity upper and lower extremities.  Good ROM, no contractures. Normal muscle tone.  Skin: no rashes, lesions, ulcers. No induration Neurologic: CN 2-12 grossly intact. Sensation intact, DTR normal. Strength 5/5 in all 4.  Psychiatric: Normal judgment and insight. Alert and oriented x 3. Normal mood.    Labs on Admission: I have personally reviewed following labs and imaging studies  CBC:  Recent Labs Lab 09/23/16 2056  WBC 10.7*  HGB 11.6*  HCT 36.3*  MCV 101.4*  PLT 182   Basic Metabolic Panel:  Recent Labs Lab 09/23/16 2056  NA 134*  K 3.5  CL 91*  CO2 30  GLUCOSE 114*  BUN 22*  CREATININE 3.11*  CALCIUM 7.8*   GFR: CrCl cannot be calculated (Unknown ideal weight.). Liver Function Tests: No results for input(s): AST, ALT, ALKPHOS, BILITOT, PROT, ALBUMIN in the last 168 hours. No results for input(s): LIPASE, AMYLASE in the  last 168 hours. No results for input(s): AMMONIA in the last 168 hours. Coagulation Profile: No results for input(s): INR, PROTIME in the last 168 hours. Cardiac Enzymes: No results for input(s): CKTOTAL, CKMB, CKMBINDEX, TROPONINI in the last 168 hours. BNP (last 3 results) No results for input(s): PROBNP in the last 8760 hours. HbA1C: No results for input(s): HGBA1C in the last 72 hours. CBG: No results for input(s): GLUCAP in the last 168 hours. Lipid Profile: No results for input(s): CHOL, HDL, LDLCALC, TRIG, CHOLHDL, LDLDIRECT in the last 72 hours. Thyroid Function Tests: No results for input(s): TSH, T4TOTAL, FREET4, T3FREE, THYROIDAB in the last 72 hours. Anemia Panel: No results for input(s): VITAMINB12, FOLATE, FERRITIN, TIBC, IRON, RETICCTPCT in the last 72 hours. Urine analysis:    Component Value Date/Time   COLORURINE AMBER (A) 08/16/2015 0655   APPEARANCEUR HAZY (A) 08/16/2015 0655   LABSPEC 1.023 08/16/2015 0655   PHURINE 5.0 08/16/2015 0655   GLUCOSEU 100 (A) 08/16/2015 0655   HGBUR NEGATIVE 08/16/2015 0655   BILIRUBINUR SMALL (A) 08/16/2015 0655    KETONESUR 15 (A) 08/16/2015 0655   PROTEINUR >300 (A) 08/16/2015 0655   UROBILINOGEN 0.2 04/16/2014 1135   NITRITE NEGATIVE 08/16/2015 0655   LEUKOCYTESUR SMALL (A) 08/16/2015 0655    Radiological Exams on Admission: Dg Chest 2 View  Result Date: 09/23/2016 CLINICAL DATA:  Shortness of breath EXAM: CHEST  2 VIEW COMPARISON:  Chest radiograph 05/06/2016 FINDINGS: The lungs remain hyperinflated. The heart size and mediastinal contours are within normal limits. Calcification within the aortic arch. Both lungs are clear. The visualized skeletal structures are unremarkable. IMPRESSION: No active cardiopulmonary disease. Unchanged calcific aortic atherosclerosis and findings of COPD. Electronically Signed   By: Ulyses Jarred M.D.   On: 09/23/2016 21:24    EKG: Independently reviewed.  Assessment/Plan Principal Problem:   HCAP (healthcare-associated pneumonia) Active Problems:   Essential hypertension   COPD exacerbation ([place])   Acute hypoxemic respiratory failure (HCC)   ESRD (end stage renal disease) on dialysis (HCC)   Chronic respiratory failure with hypoxia ([place])    1. HCAP - clinically has RLL HCAP 1. PNA pathway 2. Patient certainly has extensive risk factors for drug resistant organisms: 1. h/o MRSA colonization 2. h/o pseudomonas UTI in Feb last year 3. h/o KPC 60k cfu in urine Feb last year 3. Will continue him on zosyn and vanc for the moment and hope that this isnt due to [place] (which they noted was essentially pan-resistant on testing anyhow). 4. Cultures pending 2. COPD exacerbation - 1. Prednisone 40mg  PO daily 2. COPD pathway 3. Neb treatments PRN 4. Continue home nebs 3. Acute on chronic resp failure with hypoxia - due to HCAP and COPD exacerbation 4. ESRD - left voice mail with nephrology consult line for routine dialysis while inpatient.  Did mention his 5. HTN - hold home BP meds in face of sepsis  DVT prophylaxis: Heparin [place] Code Status: Full Family  Communication: No family in room Disposition Plan: Home after admit Consults called: None Admission status: Admit to inpatient, new O2 requirement   Etta Quill DO [facility] Pager [phone]  If 7AM-7PM, please contact day team taking care of patient [email or web address] Password [email or web address]  09/24/2016, 2:27 AM

## 2016-09-24 NOTE — Progress Notes (Signed)
PHARMACY NOTE:  ANTIMICROBIAL RENAL DOSAGE ADJUSTMENT  Current antimicrobial regimen includes a mismatch between antimicrobial dosage and estimated renal function.  As per policy approved by the Pharmacy & Therapeutics and Medical Executive Committees, the antimicrobial dosage will be adjusted accordingly.  Current antimicrobial dosage:  Tamiflu 75 mg BID x 5 days. Pharmacy to adjust. Indication: Influenza B positive  Renal Function:  [x]      On intermittent HD, scheduled: MWF []      On CRRT    Antimicrobial dosage has been changed to:  Tamiflu 30 mg today, then 30 mg after hemodialysis on Friday 09/25/16 and Monday 09/28/16.  Total of 5 days treatment.  Thank you for allowing pharmacy to be a part of this patient's care.  Arty Baumgartner, [facility]  Pager: [phone] 09/24/2016 12:40 PM

## 2016-09-24 NOTE — Telephone Encounter (Signed)
Spoke with pt's wife (dpr on file), discussed last office visit note stating why pt came off of spiriva.  Pt's wife expressed understanding.  Noted that pt is currently hospitalized and may not be able to make appt on 4/10 with TP- pt will call back on Monday if they need to cx appt.  Nothing further needed at this time.

## 2016-09-24 NOTE — Progress Notes (Signed)
Initial Nutrition Assessment  DOCUMENTATION CODES:   Non-severe (moderate) malnutrition in context of chronic illness  INTERVENTION:   Boost Breeze po TID, each supplement provides 250 kcal and 9 grams of protein  NUTRITION DIAGNOSIS:   Malnutrition (Moderate) related to chronic illness (ESRD/COPD) as evidenced by moderate depletions of muscle mass, moderate depletion of body fat.  GOAL:   Patient will meet greater than or equal to 90% of their needs  MONITOR:   PO intake, Supplement acceptance, I & O's  REASON FOR ASSESSMENT:   Consult COPD Protocol  ASSESSMENT:   Pt with hx of ESRD on HD, COPD on home O2 admitted with SOB, pt is positive for the flu.    Per husband and wife his weight has been stable for a year, but has lost a couple of pounds over the last few days due to acute illness (flu). Pt has good appetite mostly but on HD days he does not eat dinner after HD. He takes his lunch and eats 2 sandwiches ([place] and ham) and cookies during HD.  He does not like Nepro but is willing to try Boost Breeze. He is good about taking his phoslo and has a hx of it being elevated in the past but per wife it has been within range recently.  Nutrition-Focused physical exam completed. Findings are mild/moderate to severe fat depletion, mild/moderate - severe muscle depletion, and no edema.    Plans for HD tomorrow Medications reviewed and include: pholso, rena-vit   Diet Order:  Diet renal with fluid restriction Fluid restriction: 1200 mL Fluid; Room service appropriate? Yes; Fluid consistency: Thin  Skin:  Reviewed, no issues  Last BM:  unknown  Height:   Ht Readings from Last 1 Encounters:  09/24/16 5\' 7"  (1.702 m)    Weight:   Wt Readings from Last 1 Encounters:  09/24/16 156 lb (70.8 kg)    Ideal Body Weight:  67.2 kg  BMI:  Body mass index is 24.43 kg/m.  Estimated Nutritional Needs:   Kcal:  1900-2100  Protein:  85-105 grams  Fluid:  1.2  L/day  EDUCATION NEEDS:   Education needs addressed  Maylon Peppers RD, [place], [place] Pager [phone] After Hours Pager

## 2016-09-24 NOTE — ED Notes (Signed)
Admitting provider at the bedside.

## 2016-09-24 NOTE — Progress Notes (Signed)
Patient in A.Fib in the 110s.  SBP 130s.  Will write for cardizem gtt for rate control since we are holding metoprolol.  Does have h/o PAF as well.

## 2016-09-24 NOTE — Consult Note (Signed)
[facility] Renal Consultation Note    Indication for Consultation:  Management of ESRD/hemodialysis; anemia, hypertension/volume and secondary hyperparathyroidism PCP: [name], DO Referring MD: Jennette Kettle, DO  HPI: Terry [name] is a 82 y.o. male with ESRD secondary to DM on HD since November 2016 with a history of COPD with nocturnal O2.  He was seen at dialysis yesterday. He came it at his EDW and was challenged 1 kg but somehow left leaving the same as his pre HD weight. He has been on Azithromycin for a URI Rx by Dr. Annamaria Boots 09/21/16  without improvement. His appetite has been very poor. He has been afebrile during his dialysis treatments this past week.  Initial temperature yesterday was 100.4 and  spiked to 101.4. BP were slightly elevated. CXR shoed hyperinflation no excess volume with findings of COPD. Chemistries were appropriate for post HD status. Initial trop was 0.05, BNP 1438. WBC 10.7 hgb 11.6 TSH 4.77. Influenza B + EKG showed afib.  Yesterday he had no problems during his dialysis treatment.  When he got home, his wife thought "he didn't look good and made him come to the ED when he didn't improve."  He had not noted and N, V, D, fever or chills. He does have cough, SOB, constipation.  He still makes urine. He normally takes MTP XL and prn clonidine if BP is high and prn midodrine if BP is low.  His phoslo dose is related to the meat portion size during meals. Tells me he feels much better today after "medicine"  Past Medical History:  Diagnosis Date  . Acute and chronic respiratory failure 10/22/2012  . Atrial fibrillation ([place])   . CAD (coronary artery disease) [place], [place]  . COPD (chronic obstructive pulmonary disease) ([place])   . High cholesterol   . Hypertension   . Kidney disease    CKD4, sees [place] [facility], considering dialysis  . Osteoarthritis 10/22/2012  . Pneumonia April 2014  . Shortness of breath dyspnea   . Stroke [facility]) 2012    Past Surgical History:  Procedure Laterality Date  . APPENDECTOMY  1984  . AV FISTULA PLACEMENT Right 05/21/2015   Procedure: Right Arm Brachiocephalic ARTERIOVENOUS (AV) FISTULA CREATION;  Surgeon: Angelia Mould, MD;  Location: [facility];  Service: Vascular;  Laterality: Right;  . COLON SURGERY  2012   partial colon removed - twisted bowel  . CORONARY STENT PLACEMENT  1999   2 stents  . CYSTOSCOPY/RETROGRADE/URETEROSCOPY Bilateral 04/23/2014   Procedure: CYSTOSCOPY BILATERAL RETROGRADE,  LEFT URETEROSCOPY, LEFT STENT PLACEMENT;  Surgeon: Raynelle Bring, MD;  [facility];  Service: Urology;  Laterality: Bilateral;  . HERNIA REPAIR     Right inguinal   Family History  Problem Relation Age of Onset  . Cancer Mother     Breast  . Heart disease Father   . Heart attack Father   . Parkinson's disease Brother    Social History:  reports that he quit smoking about 16 years ago. His smoking use included Cigarettes, Pipe, and Cigars. He has a 49.00 pack-year smoking history. He has never used smokeless tobacco. He reports that he does not drink alcohol or use drugs. Allergies  Allergen Reactions  . Yellow Dyes (Non-Tartrazine) Other (See Comments)    Burns arms Cough medications (tussionex and benzonatate ok)  . Levaquin [Levofloxacin In D5w] Itching  . Tussionex Pennkinetic Er [Hydrocod Polst-Cpm Polst Er] Other (See Comments)    Unknown on [facility]  Prior to Admission medications   Medication Sig Start Date End Date Taking? Authorizing Provider  albuterol (VENTOLIN HFA) 108 (90 Base) MCG/ACT inhaler Inhale 2 puffs into the lungs every 4 (four) hours as needed for wheezing or shortness of breath. 08/27/15  Yes Tanda Rockers, MD  aspirin EC 325 MG tablet Take 1 tablet (325 mg total) by mouth daily. 09/26/15  Yes Burnell Blanks, MD  atorvastatin (LIPITOR) 20 MG tablet Take 1 tablet (20 mg total) by mouth daily. 09/21/16  Yes Burnell Blanks, MD  budesonide-formoterol  [facility]) 160-4.5 MCG/ACT inhaler Inhale 2 puffs into the lungs 2 (two) times daily. 09/19/15  Yes Tanda Rockers, MD  calcium acetate (PHOSLO) 667 MG capsule Take 667-1,334 mg by mouth 3 (three) times daily with meals.  01/09/16  Yes Historical Provider, MD  cholecalciferol (VITAMIN D) 1000 units tablet Take 1,000 Units by mouth daily.   Yes Historical Provider, MD  guaiFENesin (MUCINEX) 600 MG 12 hr tablet Take 600 mg by mouth 2 (two) times daily.   Yes Historical Provider, MD  ipratropium-albuterol (DUONEB) 0.5-2.5 (3) MG/3ML SOLN Take 3 mLs by nebulization every 4 (four) hours as needed (for breathing).    Yes Historical Provider, MD  methocarbamol (ROBAXIN) 500 MG tablet Take 500 mg by mouth 2 (two) times daily as needed for muscle spasms.   Yes Historical Provider, MD  metoprolol succinate (TOPROL-XL) 50 MG 24 hr tablet Take one tablet on non dialysis days and half tablet on dialysis days Patient taking differently: Take 50 mg by mouth 2 (two) times daily.  08/12/16  Yes Burnell Blanks, MD  midodrine (PROAMATINE) 10 MG tablet Take 10 mg by mouth as needed.   Yes Historical Provider, MD  multivitamin (RENA-VIT) TABS tablet Take 1 tablet by mouth daily.   Yes Historical Provider, MD  predniSONE (DELTASONE) 10 MG tablet Take 0.5 tablets (5 mg total) by mouth daily with breakfast. 08/14/16  Yes Tanda Rockers, MD  OXYGEN 1 lpm with sleep only    Historical Provider, MD   Current Facility-Administered Medications  Medication Dose Route Frequency Provider Last Rate Last Dose  . albuterol (PROVENTIL) (2.5 MG/3ML) 0.083% nebulizer solution 2.5 mg  2.5 mg Inhalation Q4H PRN Etta Quill, DO      . aspirin EC tablet 325 mg  325 mg Oral Daily Etta Quill, DO      . atorvastatin (LIPITOR) tablet 20 mg  20 mg Oral q1800 Etta Quill, DO      . calcium acetate (PHOSLO) capsule 268-3,419 mg  622-2,979 mg Oral TID WC Etta Quill, DO   667 mg at 09/24/16 0850  . diltiazem (CARDIZEM) 100  mg in dextrose 5% 135mL (1 mg/mL) infusion  5-15 mg/hr Intravenous Titrated Etta Quill, DO 5 mL/hr at 09/24/16 0700 5 mg/hr at 09/24/16 0700  . fluticasone furoate-vilanterol (BREO ELLIPTA) 200-25 MCG/INH 1 puff  1 puff Inhalation Daily Etta Quill, DO   1 puff at 09/24/16 0900  . guaiFENesin (MUCINEX) 12 hr tablet 600 mg  600 mg Oral BID Etta Quill, DO   600 mg at 09/24/16 0334  . heparin injection 5,000 Units  5,000 Units Subcutaneous Q8H Etta Quill, DO   5,000 Units at 09/24/16 8921  . ipratropium-albuterol (DUONEB) 0.5-2.5 (3) MG/3ML nebulizer solution 3 mL  3 mL Nebulization Q4H PRN Etta Quill, DO      . methocarbamol (ROBAXIN) tablet 500 mg  500 mg Oral BID  PRN Etta Quill, DO      . multivitamin (RENA-VIT) tablet 1 tablet  1 tablet Oral QHS Etta Quill, DO      . piperacillin-tazobactam (ZOSYN) IVPB 3.375 g  3.375 g Intravenous Q12H Franky Macho, RPH      . predniSONE (DELTASONE) tablet 40 mg  40 mg Oral Q breakfast Etta Quill, DO   40 mg at 09/24/16 0850  . [START ON 09/25/2016] vancomycin (VANCOCIN) IVPB 750 mg/150 ml premix  750 mg Intravenous Q M,W,F-HD Franky Macho, [facility]       Labs: Basic Metabolic Panel:  Recent Labs Lab 09/23/16 2056  NA 134*  K 3.5  CL 91*  CO2 30  GLUCOSE 114*  BUN 22*  CREATININE 3.11*  CALCIUM 7.8*   CBC:  Recent Labs Lab 09/23/16 2056  WBC 10.7*  HGB 11.6*  HCT 36.3*  MCV 101.4*  PLT 195   Studies/Results: Dg Chest 2 View  Result Date: 09/23/2016 CLINICAL DATA:  Shortness of breath EXAM: CHEST  2 VIEW COMPARISON:  Chest radiograph 05/06/2016 FINDINGS: The lungs remain hyperinflated. The heart size and mediastinal contours are within normal limits. Calcification within the aortic arch. Both lungs are clear. The visualized skeletal structures are unremarkable. IMPRESSION: No active cardiopulmonary disease. Unchanged calcific aortic atherosclerosis and findings of COPD. Electronically Signed   By: Ulyses Jarred M.D.   On: 09/23/2016 21:24   ROS: As per HPI otherwise negative.  Physical Exam: Vitals:   09/24/16 0815 09/24/16 0830 09/24/16 0845 09/24/16 0900  BP: (!) 120/37 (!) 124/39 (!) 123/30   Pulse: 66 86 65 79  Resp: (!) 23 19 18 20   Temp:      TempSrc:      SpO2: 95% 95% 96% 92%     General: elderly chronically ill WM breathing easily on O2 Head: NCAT sclera not icteric MMM Neck: Supple.  Lungs: coarse BS exp on the right prolonged exp wheezes Heart: irreg irreg - rate lower on card drip Abdomen: soft NT + BS Lower extremities:without edema or ischemic changes, no open wounds  Neuro: A & O  X 3. Moves all extremities spontaneously. Psych:  Responds to questions appropriately with a normal affect. Dialysis Access: AVF right upper + bruit  Dialysis Orders:  NWF MWF 4 hr 150 400/800 EDW 70 2K 2 Ca profile 4 AVF right upper hectorol 2 heparin 7000 Mircera 60 q 2 - down from 75 on 3/28 AVF  Recent labs: hgb 11.4 22% sat ferritin 1461 iPTH 300-  Assessment/Plan: 1. Influenza B in patient with COPD - initially thought to have PNA - could have secondary PNA- per primary; on Vanc and Zosyn/solumedrol/nebs. 2. Afib - on cardizem drip; MTP XL 50 at outpt  3. ESRD -  MWF - SOB does not seem to be volume related thought he likely has lost some weight - no need for HD today (current high inpatient HD load obviates this unless he is in extremis)  - plan Friday on schedule first round 4. Hypertension/volume  - ok - titrate volume down/ no overt pulmonary edema; on BP meds and midodrine as outpt; to be given IV lasix by primary today to help with excess volume.  5. Anemia  - hgb 11.6 trending up - due for ESA next week - hold on redosing for now unless hg drops < 11 6. Metabolic bone disease -  Hectorol 2/binders 7. Nutrition - renal carb mod/vits/supple  Myriam Jacobson, PA-C [place]  [facility] Beeper [phone] 09/24/2016, 9:09 AM   Patient seen and examined, agree with above  note with above modifications. 82 year old WM well known to [place].  ESRD- s/p treatment yesterday.  Bothered for the last week with URI sxms- then with fever qand worsening SOB last night- is flu positive- also being treated for bronchitis and COPD exacerbation, already feels better- HD tomorrow on schedule and to challenge EDW  Corliss Parish, MD 09/24/2016

## 2016-09-25 DIAGNOSIS — J189 Pneumonia, unspecified organism: Secondary | ICD-10-CM

## 2016-09-25 LAB — CBC
HEMATOCRIT: 32.4 % — AB (ref 39.0–52.0)
Hemoglobin: 10.3 g/dL — ABNORMAL LOW (ref 13.0–17.0)
MCH: 31.5 pg (ref 26.0–34.0)
MCHC: 31.8 g/dL (ref 30.0–36.0)
MCV: 99.1 fL (ref 78.0–100.0)
Platelets: 206 10*3/uL (ref 150–400)
RBC: 3.27 MIL/uL — AB (ref 4.22–5.81)
RDW: 16.3 % — ABNORMAL HIGH (ref 11.5–15.5)
WBC: 14.8 10*3/uL — AB (ref 4.0–10.5)

## 2016-09-25 LAB — RENAL FUNCTION PANEL
ANION GAP: 17 — AB (ref 5–15)
Albumin: 2.8 g/dL — ABNORMAL LOW (ref 3.5–5.0)
BUN: 66 mg/dL — ABNORMAL HIGH (ref 6–20)
CO2: 25 mmol/L (ref 22–32)
Calcium: 8.2 mg/dL — ABNORMAL LOW (ref 8.9–10.3)
Chloride: 96 mmol/L — ABNORMAL LOW (ref 101–111)
Creatinine, Ser: 6.08 mg/dL — ABNORMAL HIGH (ref 0.61–1.24)
GFR, EST AFRICAN AMERICAN: 9 mL/min — AB (ref >60)
GFR, EST NON AFRICAN AMERICAN: 8 mL/min — AB (ref >60)
Glucose, Bld: 136 mg/dL — ABNORMAL HIGH (ref 65–99)
POTASSIUM: 3.4 mmol/L — AB (ref 3.5–5.1)
Phosphorus: 6.1 mg/dL — ABNORMAL HIGH (ref 2.5–4.6)
Sodium: 138 mmol/L (ref 135–145)

## 2016-09-25 MED ORDER — VANCOMYCIN HCL IN DEXTROSE 750-5 MG/150ML-% IV SOLN
INTRAVENOUS | Status: AC
  Filled 2016-09-25: qty 150

## 2016-09-25 MED ORDER — DOXERCALCIFEROL 4 MCG/2ML IV SOLN
INTRAVENOUS | Status: AC
  Filled 2016-09-25: qty 2

## 2016-09-25 NOTE — Progress Notes (Signed)
  [facility] Progress Note   Subjective: no new c/o, cough better  Vitals:   09/25/16 0900 09/25/16 0930 09/25/16 1000 09/25/16 1020  BP: 117/71 125/80 (!) 145/80 140/80  Pulse: 73 92 90 92  Resp:    20  Temp:    98 F (36.7 C)  TempSrc:    Oral  SpO2:    100%  Weight:    67 kg (147 lb 11.3 oz)  Height:        Inpatient medications: . aspirin EC  325 mg Oral Daily  . atorvastatin  20 mg Oral q1800  . calcium acetate  667-1,334 mg Oral TID WC  . doxercalciferol  2 mcg Intravenous Q M,W,F-HD  . feeding supplement  1 Container Oral TID BM  . fluticasone furoate-vilanterol  1 puff Inhalation Daily  . guaiFENesin  600 mg Oral BID  . heparin  5,000 Units Subcutaneous Q8H  . multivitamin  1 tablet Oral QHS  . oseltamivir  30 mg Oral Q M,W,F-1800  . piperacillin-tazobactam (ZOSYN)  IV  3.375 g Intravenous Q12H  . predniSONE  40 mg Oral Q breakfast  . vancomycin  750 mg Intravenous Q M,W,F-HD    ipratropium-albuterol, levalbuterol, methocarbamol, metoprolol  Exam: No distress No jvd Chest some mild exp wheezing, mostly clear Irreg irreg no RG Abd soft ntnd RUA AVF+bruit Ox 3  Dialysis: [facility] M[facility] 4h   70kg  2/2 bath  RUA AVF  P4  Hep 7000 - hect 2 ug - Mirc 60 every 2 wks - last Hb 11.4 , 22% sat  ferr 1461  pth 300      Assessment: 1. Flu B/ COPD/ possible PNA - on IV abx, steroids, nebs 2. AFib - cardizem, BB 3. ESRD - under dry wt, lower at dc 4. HTN stable, on BP meds and midodrine as OP 5. Anemia of CKD - Hb 11-12, hold esa 6. MBD - hect/ binders  Plan - HD today, lower edw as Ronnie Derby MD [place] pager [phone]   09/25/2016, 2:03 PM    Recent Labs Lab 09/23/16 2056 09/24/16 1131 09/25/16 0747  NA 134* 137 138  K 3.5 3.4* 3.4*  CL 91* 96* 96*  CO2 30 27 25   GLUCOSE 114* 214* 136*  BUN 22* 36* 66*  CREATININE 3.11* 4.44* 6.08*  CALCIUM 7.8* 7.8* 8.2*  PHOS  --   --  6.1*    Recent Labs Lab  09/25/16 0747  ALBUMIN 2.8*    Recent Labs Lab 09/23/16 2056 09/24/16 1131 09/25/16 0747  WBC 10.7* 9.7 14.8*  HGB 11.6* 10.2* 10.3*  HCT 36.3* 32.6* 32.4*  MCV 101.4* 100.6* 99.1  PLT 195 176 206   Iron/TIBC/Ferritin/ %Sat    Component Value Date/Time   IRON 11 (L) 05/28/2015 1030   TIBC 157 (L) 05/28/2015 1030   IRONPCTSAT 7 (L) 05/28/2015 1030

## 2016-09-25 NOTE — Progress Notes (Signed)
PROGRESS NOTE    Terry [name]  TGY:[id number] DOB: 14-Aug-1934 DOA: 09/23/2016 PCP: [name], DO   Brief Narrative:  82 year old male with past medical history of ESRD, COPD on home oxygen came to the ER with the complaints of shortness of breath. He was admitted for concerns of healthcare acquired pneumonia. He was started on broad-spectrum antibiotics. He has history of multidrug-resistant bugs in the past. He is also getting treatment for COPD exacerbation with steroids and nebulizing treatment. Nephrology has been consulted as he is on dialysis.   Assessment & Plan:   Principal Problem:   HCAP (healthcare-associated pneumonia) Active Problems:   Essential hypertension   COPD exacerbation ([place])   Acute hypoxemic respiratory failure (HCC)   ESRD (end stage renal disease) on dialysis (HCC)   Chronic respiratory failure with hypoxia (HCC)  Hospital-acquired pneumonia -Sputum culture has been ordered -Continue Zosyn and vancomycin. He has history of MRSA colonization. -Rest the cultures are still pending at this time. Next line-supplemental oxygen as needed. Wean off oxygen  Mild exacerbation of mild intermittent COPD. -Continue prednisone 40 mg daily for now -Nebulizer treatments as needed. -Uses home oxygen 1-2 L at night  Acute on chronic respiratory failure with hypoxia -Multifactorial in nature as listed above. Hospital-acquired pneumonia as well as COPD exacerbation -Supplemental oxygen and wean off to home oxygen use  ESRD on hemodialysis -He's fairly close to his baseline dry weight. -1 L was plan to be removed this morning at dialysis. -Continue to monitor his electrolytes at this time.  Hypertension -Slowly restart his blood pressure medication as his blood pressure tolerates. -At home he is on Toprol XL 50 mg daily. Currently he is on Lopressor final grams IV as needed  Paroxysmal atrial fibrillation -On 09/24/2016 he had one episode of atrial fibrillation  therefore he was started on Cardizem drip. Since then he is converted to normal sinus rhythm and now he is on Lopressor 5 g IV as needed. Next-at home he takes Toprol-XL 50 g daily.   DVT prophylaxis:  Heparin subcutaneous Code Status:  Full Family Communication:  None present. Patient comprehends well Disposition Plan:  Likely discharge in next 24-48 hours  Consultants:   Nephrology  Procedures:   Hemodialysis  Antimicrobials:   Zosyn   Subjective: Patient states he feels a little better than yesterday. He still reports a mild shortness of breath with walking around in his bed and from bed to chair. Remains afebrile no other complaints.  Objective: Vitals:   09/25/16 0900 09/25/16 0930 09/25/16 1000 09/25/16 1020  BP: 117/71 125/80 (!) 145/80 140/80  Pulse: 73 92 90 92  Resp:    20  Temp:    98 F (36.7 C)  TempSrc:    Oral  SpO2:    100%  Weight:    67 kg (147 lb 11.3 oz)  Height:        Intake/Output Summary (Last 24 hours) at 09/25/16 1331 Last data filed at 09/25/16 1020  Gross per 24 hour  Intake              125 ml  Output             1000 ml  Net             -875 ml   Filed Weights   09/25/16 0624 09/25/16 0720 09/25/16 1020  Weight: 68 kg (150 lb) 68 kg (149 lb 14.6 oz) 67 kg (147 lb 11.3 oz)    Examination:  General exam: Appears calm and comfortable  Respiratory system: b/l Lower lobes coarse BS Cardiovascular system: S1 & S2 heard, RRR. No JVD, murmurs, rubs, gallops or clicks. No pedal edema. Gastrointestinal system: Abdomen is nondistended, soft and nontender. No organomegaly or masses felt. Normal bowel sounds heard. Central nervous system: Alert and oriented. No focal neurological deficits. Extremities: Symmetric 5 x 5 power. Skin: No rashes, lesions or ulcers Psychiatry: Judgement and insight appear normal. Mood & affect appropriate.     Data Reviewed:   CBC:  Recent Labs Lab 09/23/16 2056 09/24/16 1131 09/25/16 0747  WBC 10.7*  9.7 14.8*  HGB 11.6* 10.2* 10.3*  HCT 36.3* 32.6* 32.4*  MCV 101.4* 100.6* 99.1  PLT 195 176 053   Basic Metabolic Panel:  Recent Labs Lab 09/23/16 2056 09/24/16 1131 09/25/16 0747  NA 134* 137 138  K 3.5 3.4* 3.4*  CL 91* 96* 96*  CO2 30 27 25   GLUCOSE 114* 214* 136*  BUN 22* 36* 66*  CREATININE 3.11* 4.44* 6.08*  CALCIUM 7.8* 7.8* 8.2*  PHOS  --   --  6.1*   GFR: Estimated Creatinine Clearance: 9.2 mL/min (A) (by C-G formula based on SCr of 6.08 mg/dL (H)). Liver Function Tests:  Recent Labs Lab 09/25/16 0747  ALBUMIN 2.8*   No results for input(s): LIPASE, AMYLASE in the last 168 hours. No results for input(s): AMMONIA in the last 168 hours. Coagulation Profile: No results for input(s): INR, PROTIME in the last 168 hours. Cardiac Enzymes: No results for input(s): CKTOTAL, CKMB, CKMBINDEX, TROPONINI in the last 168 hours. BNP (last 3 results) No results for input(s): PROBNP in the last 8760 hours. HbA1C: No results for input(s): HGBA1C in the last 72 hours. CBG: No results for input(s): GLUCAP in the last 168 hours. Lipid Profile: No results for input(s): CHOL, HDL, LDLCALC, TRIG, CHOLHDL, LDLDIRECT in the last 72 hours. Thyroid Function Tests: No results for input(s): TSH, T4TOTAL, FREET4, T3FREE, THYROIDAB in the last 72 hours. Anemia Panel: No results for input(s): VITAMINB12, FOLATE, FERRITIN, TIBC, IRON, RETICCTPCT in the last 72 hours. Sepsis Labs:  Recent Labs Lab 09/23/16 2329 09/24/16 0206  LATICACIDVEN 1.64 1.08    Recent Results (from the past 240 hour(s))  Blood culture (routine x 2)     Status: None (Preliminary result)   Collection Time: 09/23/16 11:01 PM  Result Value Ref Range Status   Specimen Description BLOOD LEFT FOREARM  Final   Special Requests   Final    BOTTLES DRAWN AEROBIC AND ANAEROBIC Blood Culture adequate volume   Culture NO GROWTH < 24 HOURS  Final   Report Status PENDING  Incomplete  Blood culture (routine x 2)      Status: None (Preliminary result)   Collection Time: 09/23/16 11:36 PM  Result Value Ref Range Status   Specimen Description BLOOD LEFT HAND  Final   Special Requests   Final    BOTTLES DRAWN AEROBIC AND ANAEROBIC Blood Culture adequate volume   Culture NO GROWTH < 24 HOURS  Final   Report Status PENDING  Incomplete  MRSA PCR Screening     Status: Abnormal   Collection Time: 09/24/16  7:04 AM  Result Value Ref Range Status   MRSA by PCR POSITIVE (A) NEGATIVE Final    Comment:        The GeneXpert MRSA Assay (FDA approved for NASAL specimens only), is one component of a comprehensive MRSA colonization surveillance program. It is not intended to diagnose MRSA infection nor  to guide or monitor treatment for MRSA infections. RESULT CALLED TO, READ BACK BY AND VERIFIED WITH: Molli Posey RN 17:20 09/24/16 ([email or web address])          Radiology Studies: Dg Chest 2 View  Result Date: 09/23/2016 CLINICAL DATA:  Shortness of breath EXAM: CHEST  2 VIEW COMPARISON:  Chest radiograph 05/06/2016 FINDINGS: The lungs remain hyperinflated. The heart size and mediastinal contours are within normal limits. Calcification within the aortic arch. Both lungs are clear. The visualized skeletal structures are unremarkable. IMPRESSION: No active cardiopulmonary disease. Unchanged calcific aortic atherosclerosis and findings of COPD. Electronically Signed   By: Ulyses Jarred M.D.   On: 09/23/2016 21:24        Scheduled Meds: . aspirin EC  325 mg Oral Daily  . atorvastatin  20 mg Oral q1800  . calcium acetate  667-1,334 mg Oral TID WC  . doxercalciferol  2 mcg Intravenous Q M,W,F-HD  . feeding supplement  1 Container Oral TID BM  . fluticasone furoate-vilanterol  1 puff Inhalation Daily  . guaiFENesin  600 mg Oral BID  . heparin  5,000 Units Subcutaneous Q8H  . multivitamin  1 tablet Oral QHS  . oseltamivir  30 mg Oral Q M,W,F-1800  . piperacillin-tazobactam (ZOSYN)  IV  3.375 g Intravenous Q12H  .  predniSONE  40 mg Oral Q breakfast  . vancomycin  750 mg Intravenous Q M,W,F-HD   Continuous Infusions:   LOS: 1 day    Time spent: 35 mins     Ankit Arsenio Loader, MD [facility] Pager [phone]   If 7PM-7AM, please contact night-coverage [email or web address] Password [email or web address] 09/25/2016, 1:31 PM

## 2016-09-26 MED ORDER — CIPROFLOXACIN HCL 500 MG PO TABS
250.0000 mg | ORAL_TABLET | Freq: Every day | ORAL | Status: DC
  Administered 2016-09-26 – 2016-09-27 (×2): 250 mg via ORAL
  Filled 2016-09-26 (×2): qty 1

## 2016-09-26 NOTE — Progress Notes (Signed)
  [facility] Progress Note   Subjective: up in chair, no c/o  Vitals:   09/26/16 0805 09/26/16 0948 09/26/16 1330 09/26/16 1527  BP: (!) 148/67  138/68 (!) 142/69  Pulse: 88  89 87  Resp: (!) 21  (!) 22 18  Temp: 98.1 F (36.7 C)  98.2 F (36.8 C) 98.6 F (37 C)  TempSrc: Oral  Oral Oral  SpO2: 98% 96% 97% 98%  Weight:      Height:        Inpatient medications: . aspirin EC  325 mg Oral Daily  . atorvastatin  20 mg Oral q1800  . calcium acetate  667-1,334 mg Oral TID WC  . ciprofloxacin  250 mg Oral Daily  . doxercalciferol  2 mcg Intravenous Q M,W,F-HD  . feeding supplement  1 Container Oral TID BM  . fluticasone furoate-vilanterol  1 puff Inhalation Daily  . guaiFENesin  600 mg Oral BID  . heparin  5,000 Units Subcutaneous Q8H  . multivitamin  1 tablet Oral QHS  . oseltamivir  30 mg Oral Q M,W,F-1800  . predniSONE  40 mg Oral Q breakfast    ipratropium-albuterol, levalbuterol, methocarbamol, metoprolol  Exam: No distress No jvd Chest some mild exp wheezing, mostly clear Irreg irreg no RG Abd soft ntnd RUA AVF+bruit Ox 3  Dialysis: [facility] 4h   70kg  2/2 bath  RUA AVF  P4  Hep 7000 - hect 2 ug - Mirc 60 every 2 wks - last Hb 11.4 , 22% sat  ferr 1461  pth 300      Assessment: 1. Flu B/ COPD/ possible PNA - on IV abx, steroids, nebs 2. AFib - cardizem, BB 3. ESRD - under dry wt, lower dry at dc 4. HTN stable, on BP meds and midodrine as OP 5. Anemia of CKD - Hb 11-12, hold esa 6. MBD - hect/ binders  Plan - HD MWF   Kelly Splinter MD [facility] pager [phone]   09/26/2016, 3:41 PM    Recent Labs Lab 09/23/16 2056 09/24/16 1131 09/25/16 0747  NA 134* 137 138  K 3.5 3.4* 3.4*  CL 91* 96* 96*  CO2 30 27 25   GLUCOSE 114* 214* 136*  BUN 22* 36* 66*  CREATININE 3.11* 4.44* 6.08*  CALCIUM 7.8* 7.8* 8.2*  PHOS  --   --  6.1*    Recent Labs Lab 09/25/16 0747  ALBUMIN 2.8*    Recent Labs Lab  09/23/16 2056 09/24/16 1131 09/25/16 0747  WBC 10.7* 9.7 14.8*  HGB 11.6* 10.2* 10.3*  HCT 36.3* 32.6* 32.4*  MCV 101.4* 100.6* 99.1  PLT 195 176 206   Iron/TIBC/Ferritin/ %Sat    Component Value Date/Time   IRON 11 (L) 05/28/2015 1030   TIBC 157 (L) 05/28/2015 1030   IRONPCTSAT 7 (L) 05/28/2015 1030

## 2016-09-26 NOTE — Progress Notes (Signed)
PROGRESS NOTE    Terry [name]  UMP:[id number] DOB: 1934-10-02 DOA: 09/23/2016 PCP: [name], DO   Brief Narrative:  82 year old male with past medical history of ESRD, COPD on home oxygen came to the ER with the complaints of shortness of breath. He was admitted for concerns of healthcare acquired pneumonia. He was started on broad-spectrum antibiotics. He has history of multidrug-resistant bugs in the past. He is also getting treatment for COPD exacerbation with steroids and nebulizing treatment. Nephrology has been consulted as he is on dialysis.   Assessment & Plan:   Principal Problem:   HCAP (healthcare-associated pneumonia) Active Problems:   Essential hypertension   COPD exacerbation ([place])   Acute hypoxemic respiratory failure (HCC)   ESRD (end stage renal disease) on dialysis ([place])   Chronic respiratory failure with hypoxia (HCC)  Hospital-acquired pneumonia -Sputum culture has been ordered -Vancomycin and Zosyn has been discontinued. Started Cipro. He has history of MRSA colonization. -Rest the cultures are still pending at this time. -supplemental oxygen as needed. Wean off oxygen  Mild exacerbation of mild intermittent COPD. -Continue prednisone 40 mg daily for now for total of 5 days -Nebulizer treatments as needed. -Uses home oxygen 1-2 L at night  Acute on chronic respiratory failure with hypoxia, improving -Multifactorial in nature as listed above. Hospital-acquired pneumonia as well as COPD exacerbation -Supplemental oxygen and wean off to home oxygen use  ESRD on hemodialysis -He's fairly close to his baseline dry weight. - nephrology following  Hypertension -Slowly restart his blood pressure medication as his blood pressure tolerates. -At home he is on Toprol XL 50 mg daily. Currently he is on Lopressor final grams IV as needed  Paroxysmal atrial fibrillation -On 09/24/2016 he had one episode of atrial fibrillation therefore he was started on  Cardizem drip. Since then he is converted to normal sinus rhythm and now he is on Lopressor 5 g IV as needed.  -at home he takes Toprol-XL 50 g daily.  I have advised nurses to let him walk around therefore we can see what his exercise tolerance is but if his stability becomes an issue (consult physical therapy.  DVT prophylaxis:  Heparin subcutaneous Code Status:  Full Family Communication:  None present. Patient comprehends well Disposition Plan:  Likely discharge in next 24 hours  Consultants:   Nephrology  Procedures:   Hemodialysis  Antimicrobials:   Zosyn   Subjective: States he feels little better than yesterday. Remains afebrile in no acute events overnight. He has not tried a more around much as he states he has been hooked to IVs and EKG leads.  Objective: Vitals:   09/26/16 0010 09/26/16 0556 09/26/16 0805 09/26/16 0948  BP: (!) 153/67 (!) 157/69 (!) 148/67   Pulse: 84 95 88   Resp:  (!) 23 (!) 21   Temp: 98 F (36.7 C) 98 F (36.7 C) 98.1 F (36.7 C)   TempSrc: Oral Oral Oral   SpO2: 96% 97% 98% 96%  Weight:  67 kg (147 lb 12.8 oz)    Height:        Intake/Output Summary (Last 24 hours) at 09/26/16 1145 Last data filed at 09/26/16 1000  Gross per 24 hour  Intake              875 ml  Output              150 ml  Net              725  ml   Filed Weights   09/25/16 0720 09/25/16 1020 09/26/16 0556  Weight: 68 kg (149 lb 14.6 oz) 67 kg (147 lb 11.3 oz) 67 kg (147 lb 12.8 oz)    Examination:  General exam: Appears calm and comfortable  Respiratory system: b/l Lower lobes coarse BS Cardiovascular system: S1 & S2 heard, RRR. No JVD, murmurs, rubs, gallops or clicks. No pedal edema. Gastrointestinal system: Abdomen is nondistended, soft and nontender. No organomegaly or masses felt. Normal bowel sounds heard. Central nervous system: Alert and oriented. No focal neurological deficits. Extremities: Symmetric 5 x 5 power. Skin: No rashes, lesions or  ulcers Psychiatry: Judgement and insight appear normal. Mood & affect appropriate.     Data Reviewed:   CBC:  Recent Labs Lab 09/23/16 2056 09/24/16 1131 09/25/16 0747  WBC 10.7* 9.7 14.8*  HGB 11.6* 10.2* 10.3*  HCT 36.3* 32.6* 32.4*  MCV 101.4* 100.6* 99.1  PLT 195 176 962   Basic Metabolic Panel:  Recent Labs Lab 09/23/16 2056 09/24/16 1131 09/25/16 0747  NA 134* 137 138  K 3.5 3.4* 3.4*  CL 91* 96* 96*  CO2 30 27 25   GLUCOSE 114* 214* 136*  BUN 22* 36* 66*  CREATININE 3.11* 4.44* 6.08*  CALCIUM 7.8* 7.8* 8.2*  PHOS  --   --  6.1*   GFR: Estimated Creatinine Clearance: 9.2 mL/min (A) (by C-G formula based on SCr of 6.08 mg/dL (H)). Liver Function Tests:  Recent Labs Lab 09/25/16 0747  ALBUMIN 2.8*   No results for input(s): LIPASE, AMYLASE in the last 168 hours. No results for input(s): AMMONIA in the last 168 hours. Coagulation Profile: No results for input(s): INR, PROTIME in the last 168 hours. Cardiac Enzymes: No results for input(s): CKTOTAL, CKMB, CKMBINDEX, TROPONINI in the last 168 hours. BNP (last 3 results) No results for input(s): PROBNP in the last 8760 hours. HbA1C: No results for input(s): HGBA1C in the last 72 hours. CBG: No results for input(s): GLUCAP in the last 168 hours. Lipid Profile: No results for input(s): CHOL, HDL, LDLCALC, TRIG, CHOLHDL, LDLDIRECT in the last 72 hours. Thyroid Function Tests: No results for input(s): TSH, T4TOTAL, FREET4, T3FREE, THYROIDAB in the last 72 hours. Anemia Panel: No results for input(s): VITAMINB12, FOLATE, FERRITIN, TIBC, IRON, RETICCTPCT in the last 72 hours. Sepsis Labs:  Recent Labs Lab 09/23/16 2329 09/24/16 0206  LATICACIDVEN 1.64 1.08    Recent Results (from the past 240 hour(s))  Blood culture (routine x 2)     Status: None (Preliminary result)   Collection Time: 09/23/16 11:01 PM  Result Value Ref Range Status   Specimen Description BLOOD LEFT FOREARM  Final   Special  Requests   Final    BOTTLES DRAWN AEROBIC AND ANAEROBIC Blood Culture adequate volume   Culture NO GROWTH 2 DAYS  Final   Report Status PENDING  Incomplete  Blood culture (routine x 2)     Status: None (Preliminary result)   Collection Time: 09/23/16 11:36 PM  Result Value Ref Range Status   Specimen Description BLOOD LEFT HAND  Final   Special Requests   Final    BOTTLES DRAWN AEROBIC AND ANAEROBIC Blood Culture adequate volume   Culture NO GROWTH 1 DAY  Final   Report Status PENDING  Incomplete  MRSA PCR Screening     Status: Abnormal   Collection Time: 09/24/16  7:04 AM  Result Value Ref Range Status   MRSA by PCR POSITIVE (A) NEGATIVE Final    Comment:  The GeneXpert MRSA Assay (FDA approved for NASAL specimens only), is one component of a comprehensive MRSA colonization surveillance program. It is not intended to diagnose MRSA infection nor to guide or monitor treatment for MRSA infections. RESULT CALLED TO, READ BACK BY AND VERIFIED WITH: Molli Posey RN 17:20 09/24/16 ([email or web address])          Radiology Studies: No results found.      Scheduled Meds: . aspirin EC  325 mg Oral Daily  . atorvastatin  20 mg Oral q1800  . calcium acetate  667-1,334 mg Oral TID WC  . ciprofloxacin  250 mg Oral Daily  . doxercalciferol  2 mcg Intravenous Q M,W,F-HD  . feeding supplement  1 Container Oral TID BM  . fluticasone furoate-vilanterol  1 puff Inhalation Daily  . guaiFENesin  600 mg Oral BID  . heparin  5,000 Units Subcutaneous Q8H  . multivitamin  1 tablet Oral QHS  . oseltamivir  30 mg Oral Q M,W,F-1800  . predniSONE  40 mg Oral Q breakfast   Continuous Infusions:   LOS: 2 days    Time spent: 35 mins     Ankit Arsenio Loader, MD [facility] Pager [phone]   If 7PM-7AM, please contact night-coverage [email or web address] Password [email or web address] 09/26/2016, 11:45 AM

## 2016-09-26 NOTE — Progress Notes (Signed)
Pharmacist Heart Failure Core Measure Documentation  Assessment: Terry [name] has an EF documented as 65-70% on ECHO in 2016.   Rationale: Heart failure patients with left ventricular systolic dysfunction (LVSD) and an EF < 40% should be prescribed an angiotensin converting enzyme inhibitor (ACEI) or angiotensin receptor blocker (ARB) at discharge unless a contraindication is documented in the medical record.  This patient is not currently on an ACEI or ARB for HF.  This note is being placed in the record in order to provide documentation that a contraindication to the use of these agents is present for this encounter.  ACE Inhibitor or Angiotensin Receptor Blocker is contraindicated (specify all that apply)  []   ACEI allergy AND ARB allergy []   Angioedema []   Moderate or severe aortic stenosis []   Hyperkalemia []   Hypotension []   Renal artery stenosis [x]   Worsening renal function, preexisting renal disease or dysfunction (ESRD)  Terry [name], PharmD Pharmacy Resident  Pager [phone] 09/26/16 3:48 PM

## 2016-09-27 MED ORDER — PREDNISONE 20 MG PO TABS: 40.0000 mg | ORAL_TABLET | Freq: Every day | ORAL | 0 refills | Status: AC

## 2016-09-27 MED ORDER — CIPROFLOXACIN HCL 250 MG PO TABS: 250.0000 mg | ORAL_TABLET | Freq: Every day | ORAL | 0 refills | Status: AC

## 2016-09-27 MED ORDER — OSELTAMIVIR PHOSPHATE 30 MG PO CAPS: 30.0000 mg | ORAL_CAPSULE | ORAL | 0 refills | Status: AC

## 2016-09-27 MED ORDER — METOPROLOL SUCCINATE ER 50 MG PO TB24
50.0000 mg | ORAL_TABLET | Freq: Every day | ORAL | Status: DC
  Administered 2016-09-27: 50 mg via ORAL
  Filled 2016-09-27: qty 1

## 2016-09-27 NOTE — Discharge Summary (Signed)
Physician Discharge Summary  Terry [name] RUE:[id number] DOB: 03/23/1935 DOA: 09/23/2016  PCP: [name], DO  Admit date: 09/23/2016 Discharge date: 09/27/2016  Admitted From: Home Disposition:  Home  Recommendations for Outpatient Follow-up:  1. Follow up with PCP in 1 week 2. Take ciprofloxacin as prescribed 3. Take prednisone 40 mg as prescribed 4. Take Tamiflu as prescribed 5. Please use your nebulizer and rescue inhaler as needed while you're recovering from this acute illness 6. Continue using home oxygen 1-2 L as needed and at night   Home Health: None  Equipment/Devices: Continue home oxygen use   Discharge Condition: Stable  CODE STATUS: Full  Diet recommendation: Heart Healthy / Carb Modified  Brief/Interim Summary: 82 year old male with past medical history of ESRD on hemodialysis, COPD on home oxygen 1-2 L at night came to the ER with the complaints of shortness of breath. He was found to have healthcare acquired pneumonia and was also influenza positive. He was started on broad-spectrum antibiotics at first and later on narrowed down to ciprofloxacin orally which he tolerated well. During his stay he was also noted to have likely exacerbation of COPD which was likely mild therefore treated with oral steroids and nebulizer treatment. Nephrology was consulted who performed dialysis which she tolerated well. Briefly early on in his stay he also had one episode of atrial fibrillation with RVR therefore was started on Cardizem drip and soon he converted to normal sinus rhythm therefore he was taken off. Due to initial concerns of possible sepsis his Toprol was stopped and he was started on Lopressor IV as needed but later on his Toprol was started back as well. Today he is deemed medically stable and has reached Nexium benefit from inpatient stay therefore being discharged in stable condition. He is advised to take ciprofloxacin, Tamiflu and prednisone for next 2-3 days as  prescribed. He's also advised to use his nebulizer and rescue inhaler as needed while he is recovering from his acute illness. He is okay to take antitussive for cough as needed as well. At this time his oxygen needs are minimal and he can return to using home oxygen 1-2 liters as needed and at night. No other complaints today.   Discharge Diagnoses:  Principal Problem:   HCAP (healthcare-associated pneumonia) Active Problems:   Essential hypertension   COPD exacerbation ([place])   Acute hypoxemic respiratory failure (H[facility])   ESRD (end stage renal disease) on dialysis ([facility])   Chronic respiratory failure with hypoxia ([facility])  Hospital-acquired pneumonia and influenza pneumonia. -Sputum culture negative at the time of discharge but his MRSA screening was positive. -Tolerating oral Cipro well therefore he'll be discharged on that. Also continue Tamiflu for couple more doses. Both of these medications are renally dosed. -Blood cultures remain negative for 3 days on the day of discharge. -Return to home oxygen needs.  Mild exacerbation of mild intermittent COPD.-improved -Continue prednisone 40 mg daily for now for total of 5 days -Nebulizer treatments as needed. -Uses home oxygen 1-2 L at night -Antitussive as needed.  Acute on chronic respiratory failure with hypoxia, improved -Multifactorial in nature as listed above. Hospital-acquired pneumonia as well as COPD exacerbation -Supplemental oxygen and wean off to home oxygen use  ESRD on hemodialysis -Return to routine hemodialysis sessions Monday Wednesday and Friday.  Hypertension -Restarting Toprol 50 mg XL.  Atrial fibrillation-paroxysmal -Continue Toprol 50 mg XL   Discharge Instructions   Allergies as of 09/27/2016      Reactions   Yellow  Dyes (non-tartrazine) Other (See Comments)   Burns arms Cough medications (tussionex and benzonatate ok)   Levaquin [levofloxacin In D5w] Itching   Tussionex Pennkinetic Er [hydrocod  Polst-cpm Polst Er] Other (See Comments)   Unknown on MAR      Medication List    TAKE these medications   albuterol 108 (90 Base) MCG/ACT inhaler Commonly known as:  VENTOLIN HFA Inhale 2 puffs into the lungs every 4 (four) hours as needed for wheezing or shortness of breath.   aspirin EC 325 MG tablet Take 1 tablet (325 mg total) by mouth daily.   atorvastatin 20 MG tablet Commonly known as:  LIPITOR Take 1 tablet (20 mg total) by mouth daily.   budesonide-formoterol 160-4.5 MCG/ACT inhaler Commonly known as:  SYMBICORT Inhale 2 puffs into the lungs 2 (two) times daily.   calcium acetate 667 MG capsule Commonly known as:  PHOSLO Take 667-1,334 mg by mouth 3 (three) times daily with meals.   cholecalciferol 1000 units tablet Commonly known as:  VITAMIN D Take 1,000 Units by mouth daily.   ciprofloxacin 250 MG tablet Commonly known as:  CIPRO Take 1 tablet (250 mg total) by mouth daily.   guaiFENesin 600 MG 12 hr tablet Commonly known as:  MUCINEX Take 600 mg by mouth 2 (two) times daily.   ipratropium-albuterol 0.5-2.5 (3) MG/3ML Soln Commonly known as:  DUONEB Take 3 mLs by nebulization every 4 (four) hours as needed (for breathing).   methocarbamol 500 MG tablet Commonly known as:  ROBAXIN Take 500 mg by mouth 2 (two) times daily as needed for muscle spasms.   metoprolol succinate 50 MG 24 hr tablet Commonly known as:  TOPROL-XL Take one tablet on non dialysis days and half tablet on dialysis days What changed:  how much to take  how to take this  when to take this  additional instructions   midodrine 10 MG tablet Commonly known as:  PROAMATINE Take 10 mg by mouth as needed.   multivitamin Tabs tablet Take 1 tablet by mouth daily.   oseltamivir 30 MG capsule Commonly known as:  TAMIFLU Take 1 capsule (30 mg total) by mouth every Monday, Wednesday, and Friday at 6 PM. Start taking on:  09/28/2016   OXYGEN 1 lpm with sleep only   predniSONE 20  MG tablet Commonly known as:  DELTASONE Take 2 tablets (40 mg total) by mouth daily with breakfast. What changed:  medication strength  how much to take      Follow-up Information    KIM, Jarrett Soho R., DO Follow up in 1 week(s).   Specialty:  Family Medicine Contact information: [address] [place] [address] [phone]          Allergies  Allergen Reactions  . Yellow Dyes (Non-Tartrazine) Other (See Comments)    Burns arms Cough medications (tussionex and benzonatate ok)  . Levaquin [Levofloxacin In D5w] Itching  . Tussionex Pennkinetic Er [Hydrocod Polst-Cpm Polst Er] Other (See Comments)    Unknown on MAR    Consultations:  None    Procedures/Studies: Dg Chest 2 View  Result Date: 09/23/2016 CLINICAL DATA:  Shortness of breath EXAM: CHEST  2 VIEW COMPARISON:  Chest radiograph 05/06/2016 FINDINGS: The lungs remain hyperinflated. The heart size and mediastinal contours are within normal limits. Calcification within the aortic arch. Both lungs are clear. The visualized skeletal structures are unremarkable. IMPRESSION: No active cardiopulmonary disease. Unchanged calcific aortic atherosclerosis and findings of COPD. Electronically Signed   By: Lennette Bihari  Collins [place] M.D.   On: 09/23/2016 21:24      Subjective:   Discharge Exam: Vitals:   09/27/16 0025 09/27/16 0546  BP: (!) 157/72 (!) 164/65  Pulse: 89 91  Resp:    Temp: 98.1 F (36.7 C) 97.7 F (36.5 C)   Vitals:   09/26/16 1948 09/26/16 2007 09/27/16 0025 09/27/16 0546  BP: (!) 164/72  (!) 157/72 (!) 164/65  Pulse:  85 89 91  Resp:  (!) 26    Temp:  98 F (36.7 C) 98.1 F (36.7 C) 97.7 F (36.5 C)  TempSrc:  Oral Oral Oral  SpO2:  99% 100% 95%  Weight:    69 kg (152 lb 3.2 oz)  Height:        General: Pt is alert, awake, not in acute distress On 1 L oxygen at this time  Cardiovascular: RRR, S1/S2 +, no rubs, no gallops Respiratory: CTA bilaterally, no wheezing, no rhonchi Abdominal:  Soft, NT, ND, bowel sounds + Extremities: no edema, no cyanosis    The results of significant diagnostics from this hospitalization (including imaging, microbiology, ancillary and laboratory) are listed below for reference.     Microbiology: Recent Results (from the past 240 hour(s))  Blood culture (routine x 2)     Status: None (Preliminary result)   Collection Time: 09/23/16 11:01 PM  Result Value Ref Range Status   Specimen Description BLOOD LEFT FOREARM  Final   Special Requests   Final    BOTTLES DRAWN AEROBIC AND ANAEROBIC Blood Culture adequate volume   Culture NO GROWTH 3 DAYS  Final   Report Status PENDING  Incomplete  Blood culture (routine x 2)     Status: None (Preliminary result)   Collection Time: 09/23/16 11:36 PM  Result Value Ref Range Status   Specimen Description BLOOD LEFT HAND  Final   Special Requests   Final    BOTTLES DRAWN AEROBIC AND ANAEROBIC Blood Culture adequate volume   Culture NO GROWTH 2 DAYS  Final   Report Status PENDING  Incomplete  MRSA PCR Screening     Status: Abnormal   Collection Time: 09/24/16  7:04 AM  Result Value Ref Range Status   MRSA by PCR POSITIVE (A) NEGATIVE Final    Comment:        The GeneXpert MRSA Assay (FDA approved for NASAL specimens only), is one component of a comprehensive MRSA colonization surveillance program. It is not intended to diagnose MRSA infection nor to guide or monitor treatment for MRSA infections. RESULT CALLED TO, READ BACK BY AND VERIFIED WITH: Molli Posey RN 17:20 09/24/16 ([email or web address])      Labs: BNP (last 3 results)  Recent Labs  09/23/16 2216  BNP 0,277.4*   Basic Metabolic Panel:  Recent Labs Lab 09/23/16 2056 09/24/16 1131 09/25/16 0747  NA 134* 137 138  K 3.5 3.4* 3.4*  CL 91* 96* 96*  CO2 30 27 25   GLUCOSE 114* 214* 136*  BUN 22* 36* 66*  CREATININE 3.11* 4.44* 6.08*  CALCIUM 7.8* 7.8* 8.2*  PHOS  --   --  6.1*   Liver Function Tests:  Recent Labs Lab 09/25/16 0747   ALBUMIN 2.8*   No results for input(s): LIPASE, AMYLASE in the last 168 hours. No results for input(s): AMMONIA in the last 168 hours. CBC:  Recent Labs Lab 09/23/16 2056 09/24/16 1131 09/25/16 0747  WBC 10.7* 9.7 14.8*  HGB 11.6* 10.2* 10.3*  HCT 36.3* 32.6* 32.4*  MCV 101.4* 100.6* 99.1  PLT 195 176 206   Cardiac Enzymes: No results for input(s): CKTOTAL, CKMB, CKMBINDEX, TROPONINI in the last 168 hours. BNP: Invalid input(s): POCBNP CBG: No results for input(s): GLUCAP in the last 168 hours. D-Dimer No results for input(s): DDIMER in the last 72 hours. Hgb A1c No results for input(s): HGBA1C in the last 72 hours. Lipid Profile No results for input(s): CHOL, HDL, LDLCALC, TRIG, CHOLHDL, LDLDIRECT in the last 72 hours. Thyroid function studies No results for input(s): TSH, T4TOTAL, T3FREE, THYROIDAB in the last 72 hours.  Invalid input(s): FREET3 Anemia work up No results for input(s): VITAMINB12, FOLATE, FERRITIN, TIBC, IRON, RETICCTPCT in the last 72 hours. Urinalysis    Component Value Date/Time   COLORURINE AMBER (A) 08/16/2015 0655   APPEARANCEUR HAZY (A) 08/16/2015 0655   LABSPEC 1.023 08/16/2015 0655   PHURINE 5.0 08/16/2015 0655   GLUCOSEU 100 (A) 08/16/2015 0655   HGBUR NEGATIVE 08/16/2015 0655   BILIRUBINUR SMALL (A) 08/16/2015 0655   KETONESUR 15 (A) 08/16/2015 0655   PROTEINUR >300 (A) 08/16/2015 0655   UROBILINOGEN 0.2 04/16/2014 1135   NITRITE NEGATIVE 08/16/2015 0655   LEUKOCYTESUR SMALL (A) 08/16/2015 0655   Sepsis Labs Invalid input(s): PROCALCITONIN,  WBC,  LACTICIDVEN Microbiology Recent Results (from the past 240 hour(s))  Blood culture (routine x 2)     Status: None (Preliminary result)   Collection Time: 09/23/16 11:01 PM  Result Value Ref Range Status   Specimen Description BLOOD LEFT FOREARM  Final   Special Requests   Final    BOTTLES DRAWN AEROBIC AND ANAEROBIC Blood Culture adequate volume   Culture NO GROWTH 3 DAYS  Final    Report Status PENDING  Incomplete  Blood culture (routine x 2)     Status: None (Preliminary result)   Collection Time: 09/23/16 11:36 PM  Result Value Ref Range Status   Specimen Description BLOOD LEFT HAND  Final   Special Requests   Final    BOTTLES DRAWN AEROBIC AND ANAEROBIC Blood Culture adequate volume   Culture NO GROWTH 2 DAYS  Final   Report Status PENDING  Incomplete  MRSA PCR Screening     Status: Abnormal   Collection Time: 09/24/16  7:04 AM  Result Value Ref Range Status   MRSA by PCR POSITIVE (A) NEGATIVE Final    Comment:        The GeneXpert MRSA Assay (FDA approved for NASAL specimens only), is one component of a comprehensive MRSA colonization surveillance program. It is not intended to diagnose MRSA infection nor to guide or monitor treatment for MRSA infections. RESULT CALLED TO, READ BACK BY AND VERIFIED WITH: Molli Posey RN 17:20 09/24/16 ([email or web address])      Time coordinating discharge: Over 30 minutes  SIGNED:   Damita Lack, MD  [facility] 09/27/2016, 8:59 AM Pager   If 7PM-7AM, please contact night-coverage [email or web address] Password [email or web address]

## 2016-09-27 NOTE — Discharge Instructions (Signed)
Community-Acquired Pneumonia, Adult °Pneumonia is an infection of the lungs. One type of pneumonia can happen while a person is in a hospital. A different type can happen when a person is not in a hospital (community-acquired pneumonia). It is easy for this kind to spread from person to person. It can spread to you if you breathe near an infected person who coughs or sneezes. Some symptoms include: °· A dry cough. °· A wet (productive) cough. °· Fever. °· Sweating. °· Chest pain. °Follow these instructions at home: °· Take over-the-counter and prescription medicines only as told by your doctor. °¨ Only take cough medicine if you are losing sleep. °¨ If you were prescribed an antibiotic medicine, take it as told by your doctor. Do not stop taking the antibiotic even if you start to feel better. °· Sleep with your head and neck raised (elevated). You can do this by putting a few pillows under your head, or you can sleep in a recliner. °· Do not use tobacco products. These include cigarettes, chewing tobacco, and e-cigarettes. If you need help quitting, ask your doctor. °· Drink enough water to keep your pee (urine) clear or pale yellow. °A shot (vaccine) can help prevent pneumonia. Shots are often suggested for: °· People older than 80 years of age. °· People older than 80 years of age: °¨ Who are having cancer treatment. °¨ Who have long-term (chronic) lung disease. °¨ Who have problems with their body's defense system (immune system). °You may also prevent pneumonia if you take these actions: °· Get the flu (influenza) shot every year. °· Go to the dentist as often as told. °· Wash your hands often. If soap and water are not available, use hand sanitizer. °Contact a doctor if: °· You have a fever. °· You lose sleep because your cough medicine does not help. °Get help right away if: °· You are short of breath and it gets worse. °· You have more chest pain. °· Your sickness gets worse. This is very serious if: °¨ You  are an older adult. °¨ Your body's defense system is weak. °· You cough up blood. °This information is not intended to replace advice given to you by your health care provider. Make sure you discuss any questions you have with your health care provider. °Document Released: 11/25/2007 Document Revised: 11/14/2015 Document Reviewed: 10/03/2014 °Elsevier Interactive Patient Education © 2017 [employer] Inc. ° °

## 2016-09-28 DIAGNOSIS — N186 End stage renal disease: Secondary | ICD-10-CM | POA: Diagnosis not present

## 2016-09-28 DIAGNOSIS — N2581 Secondary hyperparathyroidism of renal origin: Secondary | ICD-10-CM | POA: Diagnosis not present

## 2016-09-29 ENCOUNTER — Encounter: Payer: Self-pay | Admitting: Adult Health

## 2016-09-29 ENCOUNTER — Ambulatory Visit (INDEPENDENT_AMBULATORY_CARE_PROVIDER_SITE_OTHER)
Admission: RE | Admit: 2016-09-29 | Discharge: 2016-09-29 | Disposition: A | Discharge status: Home / Self Care | Payer: Medicare PPO | Source: Ambulatory Visit | Attending: Adult Health | Admitting: Adult Health

## 2016-09-29 ENCOUNTER — Telehealth: Payer: Self-pay

## 2016-09-29 ENCOUNTER — Ambulatory Visit (INDEPENDENT_AMBULATORY_CARE_PROVIDER_SITE_OTHER): Payer: Medicare PPO | Admitting: Adult Health

## 2016-09-29 DIAGNOSIS — J9611 Chronic respiratory failure with hypoxia: Secondary | ICD-10-CM | POA: Diagnosis not present

## 2016-09-29 DIAGNOSIS — R05 Cough: Secondary | ICD-10-CM | POA: Diagnosis not present

## 2016-09-29 DIAGNOSIS — J189 Pneumonia, unspecified organism: Secondary | ICD-10-CM

## 2016-09-29 DIAGNOSIS — J449 Chronic obstructive pulmonary disease, unspecified: Secondary | ICD-10-CM | POA: Diagnosis not present

## 2016-09-29 LAB — CULTURE, BLOOD (ROUTINE X 2)
CULTURE: NO GROWTH
CULTURE: NO GROWTH
SPECIAL REQUESTS: ADEQUATE
Special Requests: ADEQUATE

## 2016-09-29 MED ORDER — ACLIDINIUM BROMIDE 400 MCG/ACT IN AEPB: 1.0000 | INHALATION_SPRAY | Freq: Two times a day (BID) | RESPIRATORY_TRACT | 0 refills | Status: DC

## 2016-09-29 MED ORDER — ACLIDINIUM BROMIDE 400 MCG/ACT IN AEPB: 1.0000 | INHALATION_SPRAY | Freq: Two times a day (BID) | RESPIRATORY_TRACT | 5 refills | Status: DC

## 2016-09-29 NOTE — Progress Notes (Signed)
Chart and office note reviewed in detail  > agree with a/p as outlined    

## 2016-09-29 NOTE — Assessment & Plan Note (Signed)
Clinically improving  Finish abx .  Check cxr today .

## 2016-09-29 NOTE — Progress Notes (Signed)
@Patient  ID: Sheral Flow, male    DOB: 1935-01-07, 82 y.o.   MRN: [id number]  Chief Complaint  Patient presents with  . Follow-up    COPD     Referring provider: [name], DO  HPI: 82 year old male former smoker followed for severe COPD, gold 3, oxygen dependent respiratory failure Has end-stage renal disease on dialysis  TEST  Trial off ACEi 11/30/2012 >>> much better symptom control - 05/28/2014   Walked RA x one lap @ 185 stopped due to  [employer] / sats ok/ mod pace - PFT's 01/26/13      FEV1  1.28 (48%) ratio 48 and no better p B2,  DLCO 39 corrects to 48%  - PFTs 02/26/2015   FEV1 0.99  (35 % ) ratio 35  p 11 % improvement from saba with DLCO  24 % corrects to 35  % for alv volume  p am symb/spiriva    - Pred maint rx  Since d/c 08/14/15  - 11/26/2015 rec reduce pred to 5 mg daily  - 07/21/2016 try off spiriva due to throat irritation > restarted at rec of wife during cough but did not help it so d/c'd again 08/13/2016 with plan to change to spriva smi if breathing worse  -09/29/2016 >trial of Tudorza   09/29/2016 [facility] follow up -COPD Verdene Lennert /FLU  Patient returns for a 6 week follow-up. Visit. Patient has been hospitalized last week for a COPD exacerbation with influenza B . Was treated with IV antibiotics, steroids, nebulized bronchodilators, and Tamiflu . Discharged 2 days ago, on Cipro, prednisone, and Tamiflu. He has a couple days left. He says he is slightly better. Continues to be very weak. Has intermittent cough and congestion. Winded with minimal activity. Appetite is somewhat decreased with intermittent nausea. He denies any hemoptysis, chest pain, orthopnea, PND, or increased leg swelling. Previously was on Spiriva but was stopped due to throat irritation. Unclear if breathing is worse since stopping Spiriva. Wife Would like him tried on another inhaler  Medications reviewed and organize into a medication count with patient education. Appears to be taking medications  correctly.  Allergies  Allergen Reactions  . Yellow Dyes (Non-Tartrazine) Other (See Comments)    Burns arms Cough medications (tussionex and benzonatate ok)  . Levaquin [Levofloxacin In D5w] Itching  . Tussionex Pennkinetic Er [Hydrocod Polst-Cpm Polst Er] Other (See Comments)    Unknown on MAR    Immunization History  Administered Date(s) Administered  . Influenza Split 02/21/2012  . Influenza Whole 03/23/2016  . Influenza, High Dose Seasonal PF 03/30/2013  . Influenza,inj,Quad PF,36+ Mos 02/26/2015  . Influenza-Unspecified 04/06/2014  . Pneumococcal Conjugate-13 04/07/2016  . Pneumococcal Polysaccharide-23 02/21/2012    Past Medical History:  Diagnosis Date  . Acute and chronic respiratory failure 10/22/2012  . Atrial fibrillation ([place])   . CAD (coronary artery disease) [place],   . COPD (chronic obstructive pulmonary disease) ([place])   . Flu 09/24/2016   influenza b  . High cholesterol   . Hypertension   . Kidney disease    CKD4, sees [place] [facility], considering dialysis  . Osteoarthritis 10/22/2012  . Pneumonia April 2014  . Shortness of breath dyspnea   . Stroke [facility]) 2012    Tobacco History: History  Smoking Status  . Former Smoker  . Packs/day: 1.00  . Years: 49.00  . Types: Cigarettes, Pipe, Cigars  . Quit date: 02/21/2000  Smokeless Tobacco  . Never Used   Counseling given:  Not Answered   Outpatient Encounter Prescriptions as of 09/29/2016  Medication Sig  . albuterol (VENTOLIN HFA) 108 (90 Base) MCG/ACT inhaler Inhale 2 puffs into the lungs every 4 (four) hours as needed for wheezing or shortness of breath.  [name] aspirin EC 325 MG tablet Take 1 tablet (325 mg total) by mouth daily.  [name] atorvastatin (LIPITOR) 20 MG tablet Take 1 tablet (20 mg total) by mouth daily.  . budesonide-formoterol (SYMBICORT) 160-4.5 MCG/ACT inhaler Inhale 2 puffs into the lungs 2 (two) times daily.  . calcium acetate (PHOSLO) 667 MG capsule Take 667-1,334 mg by mouth 3  (three) times daily with meals.   . cholecalciferol (VITAMIN D) 1000 units tablet Take 1,000 Units by mouth daily.  . ciprofloxacin (CIPRO) 250 MG tablet Take 1 tablet (250 mg total) by mouth daily.  [name] guaiFENesin (MUCINEX) 600 MG 12 hr tablet Take 600 mg by mouth 2 (two) times daily.  [name] ipratropium-albuterol (DUONEB) 0.5-2.5 (3) MG/3ML SOLN Take 3 mLs by nebulization every 4 (four) hours as needed (for breathing).   . methocarbamol (ROBAXIN) 500 MG tablet Take 500 mg by mouth 2 (two) times daily as needed for muscle spasms.  . metoprolol succinate (TOPROL-XL) 50 MG 24 hr tablet Take one tablet on non dialysis days and half tablet on dialysis days (Patient taking differently: Take 50 mg by mouth 2 (two) times daily. )  . midodrine (PROAMATINE) 10 MG tablet Take 10 mg by mouth as needed.  . multivitamin (RENA-VIT) TABS tablet Take 1 tablet by mouth daily.  [name] oseltamivir (TAMIFLU) 30 MG capsule Take 1 capsule (30 mg total) by mouth every Monday, Wednesday, and Friday at 6 PM.  . OXYGEN 1 lpm with sleep only  . predniSONE (DELTASONE) 20 MG tablet Take 2 tablets (40 mg total) by mouth daily with breakfast.   No facility-administered encounter medications on file as of 09/29/2016.      Review of Systems  Constitutional:   No  weight loss, night sweats,  Fevers, chills,  +fatigue, or  lassitude.  HEENT:   No headaches,  Difficulty swallowing,  Tooth/dental problems, or  Sore throat,                No sneezing, itching, ear ache,  +nasal congestion, post nasal drip,   CV:  No chest pain,  Orthopnea, PND, swelling in lower extremities, anasarca, dizziness, palpitations, syncope.   GI  No heartburn, indigestion, abdominal pain, nausea, vomiting, diarrhea, change in bowel habits, loss of appetite, bloody stools.   Resp:    No chest wall deformity  Skin: no rash or lesions.  GU: no dysuria, change in color of urine, no urgency or frequency.  No flank pain, no hematuria   MS:  No joint pain or  swelling.  No decreased range of motion.  No back pain.    Physical Exam  BP 132/68 (BP Location: Left Arm, Patient Position: Sitting, Cuff Size: Normal)   Pulse 91   Ht 5\' 7"  (1.702 m)   Wt 154 lb 9.6 oz (70.1 kg)   SpO2 94%   BMI 24.21 kg/m   GEN: A/Ox3; pleasant , NAD, chronically ill appearing in wheelchair   HEENT:  /AT,  EACs-clear, TMs-wnl, NOSE-clear, THROAT-clear, no lesions, no postnasal drip or exudate noted.   NECK:  Supple w/ fair ROM; no JVD; normal carotid impulses w/o bruits; no thyromegaly or nodules palpated; no lymphadenopathy.    RESP  decreased breath sounds in the bases, few rhonchi. no accessory  muscle use, no dullness to percussion  CARD:  RRR, no m/r/g, tr peripheral edema, pulses intact, no cyanosis or clubbing.  GI:   Soft & nt; nml bowel sounds; no organomegaly or masses detected.   Musco: Warm bil, no deformities or joint swelling noted.   Neuro: alert, no focal deficits noted.    Skin: Warm, no lesions or rashes   Lab Results:   BNP  Imaging: Dg Chest 2 View  Result Date: 09/23/2016 CLINICAL DATA:  Shortness of breath EXAM: CHEST  2 VIEW COMPARISON:  Chest radiograph 05/06/2016 FINDINGS: The lungs remain hyperinflated. The heart size and mediastinal contours are within normal limits. Calcification within the aortic arch. Both lungs are clear. The visualized skeletal structures are unremarkable. IMPRESSION: No active cardiopulmonary disease. Unchanged calcific aortic atherosclerosis and findings of COPD. Electronically Signed   By: Ulyses Jarred M.D.   On: 09/23/2016 21:24     Assessment & Plan:   No problem-specific Assessment & Plan notes found for this encounter.     Rexene Edison, NP 09/29/2016

## 2016-09-29 NOTE — Progress Notes (Signed)
Patient seen in the office today and instructed on use of Tudorza.  Patient expressed understanding and demonstrated technique. Parke Poisson, CMA 09/29/16

## 2016-09-29 NOTE — Assessment & Plan Note (Signed)
Cont on O2 .  

## 2016-09-29 NOTE — Addendum Note (Signed)
Addended by: Len Blalock on: 09/29/2016 02:59 PM   Modules accepted: Orders

## 2016-09-29 NOTE — Assessment & Plan Note (Signed)
Exacerbation with influenza. Now resolving. Patient is to finish antibiotics and taper steroids down to 5 mg which is his chronic dose. We'll try [place]  Patient Instructions  Begin Tudorza 1 puff Twice daily , brush rinse and gargle after use.  Finish Tamiflu and Cipro .  Taper Prednisone as directed to 10mg  1/2 daily .  Follow med calendar closely and bring to each visit.  Chest xray today .  Follow up Dr. Melvyn Novas  In 6 weeks and As needed   Please contact office for sooner follow up if symptoms do not improve or worsen or seek emergency care

## 2016-09-29 NOTE — Addendum Note (Signed)
Addended by: Parke Poisson E on: 09/29/2016 03:08 PM   Modules accepted: Orders

## 2016-09-29 NOTE — Patient Instructions (Addendum)
Begin Tudorza 1 puff Twice daily , brush rinse and gargle after use.  Finish Tamiflu and Cipro .  Taper Prednisone as directed to 10mg  1/2 daily .  Follow med calendar closely and bring to each visit.  Chest xray today .  Follow up Dr. Melvyn Novas  In 6 weeks and As needed   Please contact office for sooner follow up if symptoms do not improve or worsen or seek emergency care

## 2016-09-29 NOTE — Telephone Encounter (Signed)
LMTCB

## 2016-09-30 DIAGNOSIS — N186 End stage renal disease: Secondary | ICD-10-CM | POA: Diagnosis not present

## 2016-09-30 DIAGNOSIS — N2581 Secondary hyperparathyroidism of renal origin: Secondary | ICD-10-CM | POA: Diagnosis not present

## 2016-09-30 NOTE — Telephone Encounter (Signed)
D/C 09/27/16 To: home  Spoke with pt and he states that he is doing well. He is going to dialysis M-W-F. He is still some weak, but slowly improving. He has no questions or concerns at this time.  Appt made with Dr. Maudie Mercury 10/06/16, pt aware.   Transition Care Management Follow-up Telephone Call  How have you been since you were released from the hospital? improving   Do you understand why you were in the hospital? yes   Do you understand the discharge instrcutions? yes  Items Reviewed:  Medications reviewed: yes  Allergies reviewed: yes  Dietary changes reviewed: yes  Referrals reviewed: yes   Functional Questionnaire:   Activities of Daily Living (ADLs):   He states they are independent in the following: ambulation, bathing and hygiene, feeding, continence, grooming, toileting and dressing States they require assistance with the following: driving   Any transportation issues/concerns?: no   Any patient concerns? no   Confirmed importance and date/time of follow-up visits scheduled: yes   Confirmed with patient if condition begins to worsen call PCP or go to the ER.  Patient was given the Call-a-Nurse line [phone]: yes

## 2016-10-02 DIAGNOSIS — N186 End stage renal disease: Secondary | ICD-10-CM | POA: Diagnosis not present

## 2016-10-02 DIAGNOSIS — J449 Chronic obstructive pulmonary disease, unspecified: Secondary | ICD-10-CM | POA: Diagnosis not present

## 2016-10-02 DIAGNOSIS — N2581 Secondary hyperparathyroidism of renal origin: Secondary | ICD-10-CM | POA: Diagnosis not present

## 2016-10-05 DIAGNOSIS — N186 End stage renal disease: Secondary | ICD-10-CM | POA: Diagnosis not present

## 2016-10-05 DIAGNOSIS — N2581 Secondary hyperparathyroidism of renal origin: Secondary | ICD-10-CM | POA: Diagnosis not present

## 2016-10-06 ENCOUNTER — Ambulatory Visit (INDEPENDENT_AMBULATORY_CARE_PROVIDER_SITE_OTHER): Payer: Medicare PPO | Admitting: Family Medicine

## 2016-10-06 ENCOUNTER — Encounter: Payer: Self-pay | Admitting: Family Medicine

## 2016-10-06 VITALS — BP 120/60 | HR 60 | Temp 98.2°F | Ht 67.0 in | Wt 151.2 lb

## 2016-10-06 DIAGNOSIS — J189 Pneumonia, unspecified organism: Secondary | ICD-10-CM | POA: Diagnosis not present

## 2016-10-06 DIAGNOSIS — I480 Paroxysmal atrial fibrillation: Secondary | ICD-10-CM

## 2016-10-06 DIAGNOSIS — J441 Chronic obstructive pulmonary disease with (acute) exacerbation: Secondary | ICD-10-CM | POA: Diagnosis not present

## 2016-10-06 DIAGNOSIS — I5032 Chronic diastolic (congestive) heart failure: Secondary | ICD-10-CM | POA: Diagnosis not present

## 2016-10-06 DIAGNOSIS — I10 Essential (primary) hypertension: Secondary | ICD-10-CM | POA: Diagnosis not present

## 2016-10-06 DIAGNOSIS — N186 End stage renal disease: Secondary | ICD-10-CM

## 2016-10-06 DIAGNOSIS — Z992 Dependence on renal dialysis: Secondary | ICD-10-CM | POA: Diagnosis not present

## 2016-10-06 DIAGNOSIS — J9611 Chronic respiratory failure with hypoxia: Secondary | ICD-10-CM | POA: Diagnosis not present

## 2016-10-06 DIAGNOSIS — J111 Influenza due to unidentified influenza virus with other respiratory manifestations: Secondary | ICD-10-CM

## 2016-10-06 NOTE — Progress Notes (Signed)
HPI:  Terry [name] is a pleasant 82 yo with unfortunately a very complicated PMH hx significant for CAD, A. Fib, CHF, COPD, HLD, HTN and ESRD on dialysis here for a transitional care visit following a recent hospitalization. See phone note. Hospitalized 4/4-09/27/16 for influenza/HCAP/COPD exacerbation/hypoxic resp failure: -per discharge documentation treated with IV abx then transitioned to Cipro, steroids and Tamiflu, back to home O2 prn -stay complicated by A. Fib with RVR - treated with cardizem drip; hx PAF and sees cardiology - per note on coumadin in the past but did not tolerated due to GI bleeding and bruising so on ASA and BB -sees Dr. Melvyn Novas for his lung dz; seen by pulm 4/10 with initiation Tudorza and f/u in 6 wks -dialysis MWF, reports has lab checks including CBC/BMP there -reports: doing much better and slowly improving day by day, back to baseline home O2 of 1-2 liters per month, still with mild cough  -denies: fevers, malaise, SOB, hemoptysis, dark sputum, wheezing, CP, palpitations, swelling -he does not want to do formal PT - he feels he can stay mobile and active at home   ROS: See pertinent positives and negatives per HPI.  Past Medical History:  Diagnosis Date  . Acute and chronic respiratory failure 10/22/2012  . Atrial fibrillation ([place])   . CAD (coronary artery disease) [place], [place]  . COPD (chronic obstructive pulmonary disease) ([place])   . Flu 09/24/2016   influenza b  . High cholesterol   . Hypertension   . Kidney disease    CKD4, sees [facility], considering dialysis  . Osteoarthritis 10/22/2012  . Pneumonia April 2014  . Shortness of breath dyspnea   . Stroke [facility]) 2012    Past Surgical History:  Procedure Laterality Date  . APPENDECTOMY  1984  . AV FISTULA PLACEMENT Right 05/21/2015   Procedure: Right Arm Brachiocephalic ARTERIOVENOUS (AV) FISTULA CREATION;  Surgeon: Angelia Mould, MD;  Location: [place];  Service: Vascular;   Laterality: Right;  . COLON SURGERY  2012   partial colon removed - twisted bowel  . CORONARY STENT PLACEMENT  1999   2 stents  . CYSTOSCOPY/RETROGRADE/URETEROSCOPY Bilateral 04/23/2014   Procedure: CYSTOSCOPY BILATERAL RETROGRADE,  LEFT URETEROSCOPY, LEFT STENT PLACEMENT;  Surgeon: Raynelle Bring, MD;  Location: [facility];  Service: Urology;  Laterality: Bilateral;  . HERNIA REPAIR     Right inguinal    Family History  Problem Relation Age of Onset  . Cancer Mother     Breast  . Heart disease Father   . Heart attack Father   . Parkinson's disease Brother     Social History   Social History  . Marital status: Married    Spouse name: N/A  . Number of children: 3  . Years of education: N/A   Occupational History  . retired-worked in radio Retired   Social History Main Topics  . Smoking status: Former Smoker    Packs/day: 1.00    Years: 49.00    Types: Cigarettes, Pipe, Cigars    Quit date: 02/21/2000  . Smokeless tobacco: Never Used  . Alcohol use No  . Drug use: No  . Sexual activity: Not Asked   Other Topics Concern  . None   Social History Narrative   Work or School: retired [place] Situation: lives with wife in [place] regular; poor diet  Current Outpatient Prescriptions:  .  Aclidinium Bromide (TUDORZA PRESSAIR) 400 MCG/ACT AEPB, Inhale 1 puff into the lungs 2 (two) times daily., Disp: 1 each, Rfl: 5 .  albuterol (VENTOLIN HFA) 108 (90 Base) MCG/ACT inhaler, Inhale 2 puffs into the lungs every 4 (four) hours as needed for wheezing or shortness of breath., Disp: 1 Inhaler, Rfl: 11 .  aspirin EC 325 MG tablet, Take 1 tablet (325 mg total) by mouth daily., Disp: 30 tablet, Rfl: 0 .  atorvastatin (LIPITOR) 20 MG tablet, Take 1 tablet (20 mg total) by mouth daily., Disp: 90 tablet, Rfl: 2 .  budesonide-formoterol (SYMBICORT) 160-4.5 MCG/ACT inhaler, Inhale 2 puffs into the lungs  2 (two) times daily., Disp: 3 Inhaler, Rfl: 3 .  calcium acetate (PHOSLO) 667 MG capsule, Take 667-1,334 mg by mouth 3 (three) times daily with meals. , Disp: , Rfl:  .  cholecalciferol (VITAMIN D) 1000 units tablet, Take 1,000 Units by mouth daily., Disp: , Rfl:  .  guaiFENesin (MUCINEX) 600 MG 12 hr tablet, Take 600 mg by mouth 2 (two) times daily., Disp: , Rfl:  .  ipratropium-albuterol (DUONEB) 0.5-2.5 (3) MG/3ML SOLN, Take 3 mLs by nebulization every 4 (four) hours as needed (for breathing). , Disp: , Rfl:  .  methocarbamol (ROBAXIN) 500 MG tablet, Take 500 mg by mouth 2 (two) times daily as needed for muscle spasms., Disp: , Rfl:  .  metoprolol succinate (TOPROL-XL) 50 MG 24 hr tablet, Take one tablet on non dialysis days and half tablet on dialysis days (Patient taking differently: Take 50 mg by mouth 2 (two) times daily. ), Disp: 30 tablet, Rfl: 11 .  midodrine (PROAMATINE) 10 MG tablet, Take 10 mg by mouth as needed., Disp: , Rfl:  .  multivitamin (RENA-VIT) TABS tablet, Take 1 tablet by mouth daily., Disp: , Rfl:  .  OXYGEN, 1 lpm with sleep only, Disp: , Rfl:  .  Aclidinium Bromide (TUDORZA PRESSAIR) 400 MCG/ACT AEPB, Inhale 1 puff into the lungs 2 (two) times daily., Disp: 1 each, Rfl: 0  EXAM:  Vitals:   10/06/16 1413  BP: 120/60  Pulse: 60  Temp: 98.2 F (36.8 C)    Body mass index is 23.68 kg/m.  GENERAL: vitals reviewed and listed above, alert, oriented, appears well hydrated and in no acute distress  HEENT: atraumatic, conjunttiva clear, no obvious abnormalities on inspection of external nose and ears  NECK: no obvious masses on inspection  LUNGS: clear to auscultation bilaterally, no wheezes, rales or rhonchi, good air movement  CV: HRRR, no peripheral edema  MS: moves all extremities without noticeable abnormality  PSYCH: pleasant and cooperative, no obvious depression or anxiety  ASSESSMENT AND PLAN:  Discussed the following assessment and plan:  HCAP  (healthcare-associated pneumonia)  Chronic respiratory failure with hypoxia (HCC)  ESRD (end stage renal disease) on dialysis (HCC)  Essential hypertension  Chronic diastolic heart failure (HCC)  PAF (paroxysmal atrial fibrillation) (HCC)  Chronic obstructive pulmonary disease with acute exacerbation (HCC)  Influenza  -seems to be doing great considering recent illness -offered PT to ensure physical strength - he feels can exercise at home and discussed chair and gentle home exercises -discussed good nutrition -return precuations -Patient advised to return or notify a doctor immediately if symptoms worsen or persist or new concerns arise.  Patient Instructions  BEFORE YOU LEAVE: -follow up: 3 months  Keep moving. Chair exercises when sitting. Gentle walks as tolerated.  Call your lung doctor if any worsening of cough  or recurrence any lung issues.  Eat healthy regular meals.  Glad to see you doing better!   Colin Benton R., DO

## 2016-10-06 NOTE — Progress Notes (Signed)
Pre visit review using our clinic review tool, if applicable. No additional management support is needed unless otherwise documented below in the visit note. 

## 2016-10-06 NOTE — Patient Instructions (Signed)
BEFORE YOU LEAVE: -follow up: 3 months  Keep moving. Chair exercises when sitting. Gentle walks as tolerated.  Call your lung doctor if any worsening of cough or recurrence any lung issues.  Eat healthy regular meals.  Glad to see you doing better!

## 2016-10-07 DIAGNOSIS — N186 End stage renal disease: Secondary | ICD-10-CM | POA: Diagnosis not present

## 2016-10-07 DIAGNOSIS — N2581 Secondary hyperparathyroidism of renal origin: Secondary | ICD-10-CM | POA: Diagnosis not present

## 2016-10-08 NOTE — Addendum Note (Signed)
Addended by: Elie Confer on: 10/08/2016 10:27 AM   Modules accepted: Orders

## 2016-10-09 DIAGNOSIS — N186 End stage renal disease: Secondary | ICD-10-CM | POA: Diagnosis not present

## 2016-10-09 DIAGNOSIS — N2581 Secondary hyperparathyroidism of renal origin: Secondary | ICD-10-CM | POA: Diagnosis not present

## 2016-10-12 DIAGNOSIS — N186 End stage renal disease: Secondary | ICD-10-CM | POA: Diagnosis not present

## 2016-10-12 DIAGNOSIS — N2581 Secondary hyperparathyroidism of renal origin: Secondary | ICD-10-CM | POA: Diagnosis not present

## 2016-10-14 DIAGNOSIS — N2581 Secondary hyperparathyroidism of renal origin: Secondary | ICD-10-CM | POA: Diagnosis not present

## 2016-10-14 DIAGNOSIS — N186 End stage renal disease: Secondary | ICD-10-CM | POA: Diagnosis not present

## 2016-10-15 ENCOUNTER — Ambulatory Visit (INDEPENDENT_AMBULATORY_CARE_PROVIDER_SITE_OTHER): Payer: Medicare PPO | Admitting: Podiatry

## 2016-10-15 ENCOUNTER — Encounter: Payer: Self-pay | Admitting: Podiatry

## 2016-10-15 DIAGNOSIS — M79676 Pain in unspecified toe(s): Secondary | ICD-10-CM | POA: Diagnosis not present

## 2016-10-15 DIAGNOSIS — L600 Ingrowing nail: Secondary | ICD-10-CM

## 2016-10-15 DIAGNOSIS — B351 Tinea unguium: Secondary | ICD-10-CM | POA: Diagnosis not present

## 2016-10-15 NOTE — Progress Notes (Signed)
This patient presents the office with chief complaint of continued severe pain noted big toenail, left foot. He says the nails painful walking and wearing his shoes. We have previously done nail surgery on the inside corner of the left great toe. He says pain continues to be severe and he presents the office today for definitive evaluation and treatment of this condition   GENERAL APPEARANCE: Alert, conversant. Appropriately groomed. No acute distress.  VASCULAR: Pedal pulses are  palpable at  [facility] and PT bilateral.  Capillary refill time is immediate to all digits,  Normal temperature gradient.  Digital hair growth is present bilateral  NEUROLOGIC: sensation is normal to 5.07 monofilament at 5/5 sites bilateral.  Light touch is intact bilateral, Muscle strength normal.  MUSCULOSKELETAL: acceptable muscle strength, tone and stability bilateral.  Intrinsic muscluature intact bilateral.  Rectus appearance of foot and digits noted bilateral.   DERMATOLOGIC: skin color, texture, and turgor are within normal limits.  No preulcerative lesions or ulcers  are seen, no interdigital maceration noted.  No open lesions present.  . No drainage noted.  NAILS  thick disfigured hallux toenail, left foot. Marked incurvation noted along the lateral border left foot. No evidence of any pus or drainage  Onychomycosis  Left hallux  ROV  Nail surgery.  Treatment options and alternatives discussed.  Recommended permanent phenol matrixectomy and patient agreed. Left hallux  was prepped with alcohol and a toe block of 3cc of 2% lidocaine plain was administered in a digital toe block. .  The toe was then prepped with betadine solution .  The  nail border was then excised and matrix tissue exposed.  Phenol was then applied to the matrix tissue followed by an alcohol wash.  Antibiotic ointment and a dry sterile dressing was applied.  The patient was dispensed instructions for aftercare. RTC 1 week.     Gardiner Barefoot DPM

## 2016-10-16 DIAGNOSIS — N2581 Secondary hyperparathyroidism of renal origin: Secondary | ICD-10-CM | POA: Diagnosis not present

## 2016-10-16 DIAGNOSIS — N186 End stage renal disease: Secondary | ICD-10-CM | POA: Diagnosis not present

## 2016-10-19 DIAGNOSIS — E1122 Type 2 diabetes mellitus with diabetic chronic kidney disease: Secondary | ICD-10-CM | POA: Diagnosis not present

## 2016-10-19 DIAGNOSIS — Z992 Dependence on renal dialysis: Secondary | ICD-10-CM | POA: Diagnosis not present

## 2016-10-19 DIAGNOSIS — N2581 Secondary hyperparathyroidism of renal origin: Secondary | ICD-10-CM | POA: Diagnosis not present

## 2016-10-19 DIAGNOSIS — N186 End stage renal disease: Secondary | ICD-10-CM | POA: Diagnosis not present

## 2016-10-21 DIAGNOSIS — D631 Anemia in chronic kidney disease: Secondary | ICD-10-CM | POA: Diagnosis not present

## 2016-10-21 DIAGNOSIS — N186 End stage renal disease: Secondary | ICD-10-CM | POA: Diagnosis not present

## 2016-10-21 DIAGNOSIS — N2581 Secondary hyperparathyroidism of renal origin: Secondary | ICD-10-CM | POA: Diagnosis not present

## 2016-10-22 ENCOUNTER — Encounter: Payer: Self-pay | Admitting: Podiatry

## 2016-10-22 ENCOUNTER — Other Ambulatory Visit: Payer: Self-pay | Admitting: Internal Medicine

## 2016-10-22 ENCOUNTER — Ambulatory Visit (INDEPENDENT_AMBULATORY_CARE_PROVIDER_SITE_OTHER): Payer: Medicare PPO | Admitting: Podiatry

## 2016-10-22 DIAGNOSIS — Z09 Encounter for follow-up examination after completed treatment for conditions other than malignant neoplasm: Secondary | ICD-10-CM

## 2016-10-22 MED ORDER — PREDNISONE 10 MG PO TABS: 5.0000 mg | ORAL_TABLET | Freq: Every day | ORAL | 2 refills | Status: DC

## 2016-10-22 NOTE — Progress Notes (Signed)
This patient returns to the office following nail surgery one week ago.  The patient says toe has been soaked and bandaged as directed.  There has been improvement of the toe since the surgery has been performed. The patient presents for continued evaluation and treatment.  GENERAL APPEARANCE: Alert, conversant. Appropriately groomed. No acute distress.  VASCULAR: Pedal pulses palpable at  DP and PT bilateral.  Capillary refill time is immediate to all digits,  Normal temperature gradient.    NEUROLOGIC: sensation is normal to 5.07 monofilament at 5/5 sites bilateral.  Light touch is intact bilateral, Muscle strength normal.  MUSCULOSKELETAL: acceptable muscle strength, tone and stability bilateral.  Intrinsic muscluature intact bilateral.  Rectus appearance of foot and digits noted bilateral.   DERMATOLOGIC: skin color, texture, and turgor are within normal limits.  No preulcerative lesions or ulcers  are seen, no interdigital maceration noted.   NAILS  There is necrotic tissue along the nail groove  In the absence of redness swelling and pain.  DX  S/p nail surgery  ROV  Home instructions were discussed.  Patient to call the office if there are any questions or concerns.   Lyndal Alamillo DPM   

## 2016-10-23 DIAGNOSIS — N186 End stage renal disease: Secondary | ICD-10-CM | POA: Diagnosis not present

## 2016-10-23 DIAGNOSIS — N2581 Secondary hyperparathyroidism of renal origin: Secondary | ICD-10-CM | POA: Diagnosis not present

## 2016-10-23 DIAGNOSIS — D631 Anemia in chronic kidney disease: Secondary | ICD-10-CM | POA: Diagnosis not present

## 2016-10-26 DIAGNOSIS — N186 End stage renal disease: Secondary | ICD-10-CM | POA: Diagnosis not present

## 2016-10-26 DIAGNOSIS — D631 Anemia in chronic kidney disease: Secondary | ICD-10-CM | POA: Diagnosis not present

## 2016-10-26 DIAGNOSIS — N2581 Secondary hyperparathyroidism of renal origin: Secondary | ICD-10-CM | POA: Diagnosis not present

## 2016-10-28 DIAGNOSIS — D631 Anemia in chronic kidney disease: Secondary | ICD-10-CM | POA: Diagnosis not present

## 2016-10-28 DIAGNOSIS — N2581 Secondary hyperparathyroidism of renal origin: Secondary | ICD-10-CM | POA: Diagnosis not present

## 2016-10-28 DIAGNOSIS — N186 End stage renal disease: Secondary | ICD-10-CM | POA: Diagnosis not present

## 2016-10-30 DIAGNOSIS — D631 Anemia in chronic kidney disease: Secondary | ICD-10-CM | POA: Diagnosis not present

## 2016-10-30 DIAGNOSIS — N186 End stage renal disease: Secondary | ICD-10-CM | POA: Diagnosis not present

## 2016-10-30 DIAGNOSIS — N2581 Secondary hyperparathyroidism of renal origin: Secondary | ICD-10-CM | POA: Diagnosis not present

## 2016-11-01 DIAGNOSIS — J449 Chronic obstructive pulmonary disease, unspecified: Secondary | ICD-10-CM | POA: Diagnosis not present

## 2016-11-02 DIAGNOSIS — N2581 Secondary hyperparathyroidism of renal origin: Secondary | ICD-10-CM | POA: Diagnosis not present

## 2016-11-02 DIAGNOSIS — D631 Anemia in chronic kidney disease: Secondary | ICD-10-CM | POA: Diagnosis not present

## 2016-11-02 DIAGNOSIS — N186 End stage renal disease: Secondary | ICD-10-CM | POA: Diagnosis not present

## 2016-11-04 DIAGNOSIS — N2581 Secondary hyperparathyroidism of renal origin: Secondary | ICD-10-CM | POA: Diagnosis not present

## 2016-11-04 DIAGNOSIS — N186 End stage renal disease: Secondary | ICD-10-CM | POA: Diagnosis not present

## 2016-11-04 DIAGNOSIS — D631 Anemia in chronic kidney disease: Secondary | ICD-10-CM | POA: Diagnosis not present

## 2016-11-06 DIAGNOSIS — N2581 Secondary hyperparathyroidism of renal origin: Secondary | ICD-10-CM | POA: Diagnosis not present

## 2016-11-06 DIAGNOSIS — D631 Anemia in chronic kidney disease: Secondary | ICD-10-CM | POA: Diagnosis not present

## 2016-11-06 DIAGNOSIS — N186 End stage renal disease: Secondary | ICD-10-CM | POA: Diagnosis not present

## 2016-11-09 DIAGNOSIS — N186 End stage renal disease: Secondary | ICD-10-CM | POA: Diagnosis not present

## 2016-11-09 DIAGNOSIS — D631 Anemia in chronic kidney disease: Secondary | ICD-10-CM | POA: Diagnosis not present

## 2016-11-09 DIAGNOSIS — N2581 Secondary hyperparathyroidism of renal origin: Secondary | ICD-10-CM | POA: Diagnosis not present

## 2016-11-10 ENCOUNTER — Ambulatory Visit: Payer: Medicare PPO | Admitting: Internal Medicine

## 2016-11-11 DIAGNOSIS — N186 End stage renal disease: Secondary | ICD-10-CM | POA: Diagnosis not present

## 2016-11-11 DIAGNOSIS — D631 Anemia in chronic kidney disease: Secondary | ICD-10-CM | POA: Diagnosis not present

## 2016-11-11 DIAGNOSIS — N2581 Secondary hyperparathyroidism of renal origin: Secondary | ICD-10-CM | POA: Diagnosis not present

## 2016-11-13 DIAGNOSIS — D631 Anemia in chronic kidney disease: Secondary | ICD-10-CM | POA: Diagnosis not present

## 2016-11-13 DIAGNOSIS — N186 End stage renal disease: Secondary | ICD-10-CM | POA: Diagnosis not present

## 2016-11-13 DIAGNOSIS — N2581 Secondary hyperparathyroidism of renal origin: Secondary | ICD-10-CM | POA: Diagnosis not present

## 2016-11-16 DIAGNOSIS — N186 End stage renal disease: Secondary | ICD-10-CM | POA: Diagnosis not present

## 2016-11-16 DIAGNOSIS — Z992 Dependence on renal dialysis: Secondary | ICD-10-CM | POA: Diagnosis not present

## 2016-11-18 DIAGNOSIS — Z992 Dependence on renal dialysis: Secondary | ICD-10-CM | POA: Diagnosis not present

## 2016-11-18 DIAGNOSIS — N186 End stage renal disease: Secondary | ICD-10-CM | POA: Diagnosis not present

## 2016-11-19 DIAGNOSIS — E1122 Type 2 diabetes mellitus with diabetic chronic kidney disease: Secondary | ICD-10-CM | POA: Diagnosis not present

## 2016-11-19 DIAGNOSIS — N186 End stage renal disease: Secondary | ICD-10-CM | POA: Diagnosis not present

## 2016-11-19 DIAGNOSIS — Z992 Dependence on renal dialysis: Secondary | ICD-10-CM | POA: Diagnosis not present

## 2016-11-20 DIAGNOSIS — Z992 Dependence on renal dialysis: Secondary | ICD-10-CM | POA: Diagnosis not present

## 2016-11-20 DIAGNOSIS — N186 End stage renal disease: Secondary | ICD-10-CM | POA: Diagnosis not present

## 2016-11-23 DIAGNOSIS — D631 Anemia in chronic kidney disease: Secondary | ICD-10-CM | POA: Diagnosis not present

## 2016-11-23 DIAGNOSIS — N2581 Secondary hyperparathyroidism of renal origin: Secondary | ICD-10-CM | POA: Diagnosis not present

## 2016-11-23 DIAGNOSIS — Z23 Encounter for immunization: Secondary | ICD-10-CM | POA: Diagnosis not present

## 2016-11-23 DIAGNOSIS — N186 End stage renal disease: Secondary | ICD-10-CM | POA: Diagnosis not present

## 2016-11-25 DIAGNOSIS — D631 Anemia in chronic kidney disease: Secondary | ICD-10-CM | POA: Diagnosis not present

## 2016-11-25 DIAGNOSIS — Z23 Encounter for immunization: Secondary | ICD-10-CM | POA: Diagnosis not present

## 2016-11-25 DIAGNOSIS — N186 End stage renal disease: Secondary | ICD-10-CM | POA: Diagnosis not present

## 2016-11-25 DIAGNOSIS — N2581 Secondary hyperparathyroidism of renal origin: Secondary | ICD-10-CM | POA: Diagnosis not present

## 2016-11-27 DIAGNOSIS — N186 End stage renal disease: Secondary | ICD-10-CM | POA: Diagnosis not present

## 2016-11-27 DIAGNOSIS — N2581 Secondary hyperparathyroidism of renal origin: Secondary | ICD-10-CM | POA: Diagnosis not present

## 2016-11-27 DIAGNOSIS — Z23 Encounter for immunization: Secondary | ICD-10-CM | POA: Diagnosis not present

## 2016-11-27 DIAGNOSIS — D631 Anemia in chronic kidney disease: Secondary | ICD-10-CM | POA: Diagnosis not present

## 2016-11-30 ENCOUNTER — Other Ambulatory Visit: Payer: Self-pay | Admitting: Internal Medicine

## 2016-11-30 DIAGNOSIS — N186 End stage renal disease: Secondary | ICD-10-CM | POA: Diagnosis not present

## 2016-11-30 DIAGNOSIS — D631 Anemia in chronic kidney disease: Secondary | ICD-10-CM | POA: Diagnosis not present

## 2016-11-30 DIAGNOSIS — Z23 Encounter for immunization: Secondary | ICD-10-CM | POA: Diagnosis not present

## 2016-11-30 DIAGNOSIS — N2581 Secondary hyperparathyroidism of renal origin: Secondary | ICD-10-CM | POA: Diagnosis not present

## 2016-12-02 DIAGNOSIS — D631 Anemia in chronic kidney disease: Secondary | ICD-10-CM | POA: Diagnosis not present

## 2016-12-02 DIAGNOSIS — Z23 Encounter for immunization: Secondary | ICD-10-CM | POA: Diagnosis not present

## 2016-12-02 DIAGNOSIS — N186 End stage renal disease: Secondary | ICD-10-CM | POA: Diagnosis not present

## 2016-12-02 DIAGNOSIS — N2581 Secondary hyperparathyroidism of renal origin: Secondary | ICD-10-CM | POA: Diagnosis not present

## 2016-12-02 DIAGNOSIS — J449 Chronic obstructive pulmonary disease, unspecified: Secondary | ICD-10-CM | POA: Diagnosis not present

## 2016-12-04 DIAGNOSIS — N186 End stage renal disease: Secondary | ICD-10-CM | POA: Diagnosis not present

## 2016-12-04 DIAGNOSIS — Z23 Encounter for immunization: Secondary | ICD-10-CM | POA: Diagnosis not present

## 2016-12-04 DIAGNOSIS — D631 Anemia in chronic kidney disease: Secondary | ICD-10-CM | POA: Diagnosis not present

## 2016-12-04 DIAGNOSIS — N2581 Secondary hyperparathyroidism of renal origin: Secondary | ICD-10-CM | POA: Diagnosis not present

## 2016-12-07 DIAGNOSIS — N2581 Secondary hyperparathyroidism of renal origin: Secondary | ICD-10-CM | POA: Diagnosis not present

## 2016-12-07 DIAGNOSIS — N186 End stage renal disease: Secondary | ICD-10-CM | POA: Diagnosis not present

## 2016-12-07 DIAGNOSIS — D631 Anemia in chronic kidney disease: Secondary | ICD-10-CM | POA: Diagnosis not present

## 2016-12-07 DIAGNOSIS — Z23 Encounter for immunization: Secondary | ICD-10-CM | POA: Diagnosis not present

## 2016-12-09 ENCOUNTER — Ambulatory Visit: Payer: Medicare PPO | Admitting: Internal Medicine

## 2016-12-09 DIAGNOSIS — Z23 Encounter for immunization: Secondary | ICD-10-CM | POA: Diagnosis not present

## 2016-12-09 DIAGNOSIS — N186 End stage renal disease: Secondary | ICD-10-CM | POA: Diagnosis not present

## 2016-12-09 DIAGNOSIS — N2581 Secondary hyperparathyroidism of renal origin: Secondary | ICD-10-CM | POA: Diagnosis not present

## 2016-12-09 DIAGNOSIS — D631 Anemia in chronic kidney disease: Secondary | ICD-10-CM | POA: Diagnosis not present

## 2016-12-10 ENCOUNTER — Ambulatory Visit (INDEPENDENT_AMBULATORY_CARE_PROVIDER_SITE_OTHER): Payer: Medicare PPO | Admitting: Internal Medicine

## 2016-12-10 ENCOUNTER — Encounter: Payer: Self-pay | Admitting: Internal Medicine

## 2016-12-10 VITALS — BP 118/78 | HR 65 | Ht 67.0 in | Wt 150.4 lb

## 2016-12-10 DIAGNOSIS — J9611 Chronic respiratory failure with hypoxia: Secondary | ICD-10-CM

## 2016-12-10 DIAGNOSIS — J449 Chronic obstructive pulmonary disease, unspecified: Secondary | ICD-10-CM

## 2016-12-10 NOTE — Patient Instructions (Addendum)
No change in medications  See calendar for specific medication instructions and bring it back for each and every office visit for every healthcare provider you see.  Without it,  you may not receive the best quality medical care that we feel you deserve.  You will note that the calendar groups together  your maintenance  medications that are timed at particular times of the day.  Think of this as your checklist for what your doctor has instructed you to do until your next evaluation to see what benefit  there is  to staying on a consistent group of medications intended to keep you well.  The other group at the bottom is entirely up to you to use as you see fit  for specific symptoms that may arise between visits that require you to treat them on an as needed basis.  Think of this as your action plan or "what if" list.   Separating the top medications from the bottom group is fundamental to providing you adequate care going forward.    Please schedule a follow up visit in 3 months but call sooner if needed  Review with him how he uses 02 and pred on each ov

## 2016-12-10 NOTE — Progress Notes (Addendum)
Subjective:    Patient ID: Terry [name], male    DOB: Feb 23, 1935    MRN: [id number]   Brief patient profile:  74 yowm quit smoking 2001 bothered by doe seemed better then worse in 2010 p "lung infection" and stayed on advair/spiriva  Combo since 2012 seemed better then admitted in [facility] with pna 10/09/12  in then transferred to [facility] x 3 weeks and discharged  On May 1st  2014 referred to pulmonary clinic 11/29/12  by [facility] for copd eval and proved to have GOLD III copd 01/26/13 with symptoms much better off ACEi.    History of Present Illness 08/27/2015  Extended post hosp /transition of care  f/u ov/Terry [name] re: COPD GOLD III recurrent admits/ now chronic resp failure/HD dep CRI plus GOLD III rx symb/spriiva/neb saba Chief Complaint  Patient presents with  . Follow-up    Pt c/o occasional cough with white mucus. Pt denies wheeze/increased SOB/CP/tightness. Pt is on O2/2L, he states that on 3L it dries out his nose and burns. Pt also c/o low BP readings and wonders if some of his HTN meds need to be changed.   now sleeping in a recliner since admit 30-45 degrees due to sob/some noct cough but excess/ purulent sputum or mucus plugs   On prednisone with floor of   @ 10 mg since d/c 08/19/15  rec May need to consider adding Oracit to your regimen and I will le Dr Terry [name] know You will need to adjust your 02 to saturations of over 90%  Best fit and humidified 02 ordered  Plan A = Automatic = Symbicort 160 2 in am and 2 in pm with spiriva each am and prednisone 10 mg with breakfast daily  Work on inhaler technique:  Plan B = Backup Only use your albuterol (proventil) as a rescue medication Plan C = Crisis - only use your albuterol nebulizer if you first try Plan B and it fails to help > ok to use the nebulizer up to every 4 hours but if start needing it regularly call for immediate appointment   09/26/2015  f/u ov/Terry [name] re: COPD  GOLD  III on symbicort 160/spiriva and 02 2lpm 24/7  Chief  Complaint  Patient presents with  . Follow-up    Cough has improved some- requesting refill on tessalon.   doe/ weak x room to room  Prednisone 10 mg daily floor for now  No need for saba or neb  At this point  rec Prednisone 10 mg one on even and one-half on odd days x 2 weeks and if not change then reduce to 5 mg daily  Work on maintaining perfect inhaler technique:   Goal for 02 is 90% or above / ok adjust up  Or down or off as along as attaining this goal      01/07/2016  f/u ov/Terry [name] re:  Copd GOLD III/ rx 5 prednisone / symb/spiriva and rare saba  (maybe once a week)  02 hs only  Chief Complaint  Patient presents with  . Follow-up    pt states breathing is baseline since last OV. c/o sob & non prod cough.  02 1lpm hs rarely during the day / c/o dry mouth ? From spiriva dpi Doe = MMRC3 = can't walk 100 yards even at a slow pace at a flat grade s stopping due to sob   rec Plan A = Automatic = symbicort 160 Take 2 puffs first thing in am and then another  2 puffs about 12 hours later and spiriva 1.25 x 4 or 2.5 x 2puffs in am only  Plan B = Backup Only use your albuterol (ventolin) as a rescue medication  Plan C = Crisis - only use your albuterol nebulizer if you first try Plan B and it fails to help > ok to use the nebulizer up to every 4 hours but if start needing it regularly call for immediate appointment Prednisone 5 mg daily   Please schedule a follow up visit in 3 months but call sooner if needed        05/06/2016 acute extended ov/Terry [name] re:   Copd flare on pred 10 one half daily insists he's not on amiodarone  Chief Complaint  Patient presents with  . Acute Visit    Pt c/o increased cough and wheezing for the past 10 days. His cough is prod with yellow sputum.    onset was 10 days prior to OV  Onset with sore throat ? Has flutter/ very confused again re details of care   >>zpack / pred     05/07/2016 NP Follow up : COPD  Pt returns for follow up for med review. We  reviewed all his medications organize them into a medication count with patient education. Several medications are not brought today. However, patient says he has them at home. was seen 05/06/16 for COPD exacerbation, started on a Z-Pak and prednisone burst. Patient has started Z-Pak and says that he is starting to feel better. Unfortunately he did not start the prednisone   rec Follow med calendar closely and bring to each visit.  Taper prednisone as below : 4 tabs for 2 days, 2 tabs daily for  2 days, then 1 tab daily for 2 days , then 1/2 tab daily .     05/22/2016  f/u ov/Terry [name] re: COPD GOLDI III/ steroid dep / 02 dep/ has med cal with multiple erroneous edits he's made with wrong meds in wrong columns and note did not bring all meds in as requested for med reconciliation/ med calendar Chief Complaint  Patient presents with  . Follow-up    Medication review.   does HD MWF  On 02 24/7  1lpm  on conc and 2lpm on POC  Some cough/ nasal and chest congestion improved with pred burst and taper to 5 mg daily Min need for saba on sym/spiriva   rec Ok for stuffy nose to use nasal saline as needed as per med calendar Work on perfecting  inhaler technique:  See calendar for specific medication instructions and bring it back for each and every office visit     07/21/2016  f/u ov/Terry [name] re:  GOLD III/ symbicort and spriva dpi / no med calendar Chief Complaint  Patient presents with  . Follow-up    Breathing is doing well. He has not needed any albuterol.    nasal drainage symptoms xyears do no wake him up, few coughs and it's gone each am    rec Try off spiriva (think of it like high octane fuel) See calendar for specific medication instructions > If not doing well with your medications as they are listed on the med calendar, first see Tammy NP with the calendar we made today and all you medications in 2 bags, your automatics vs your as neededs   08/13/2016  f/u ov/Terry [name] re: COPD III/ has med  calendar maint symb/spriiva dpi still in use  Chief Complaint  Patient presents with  . Acute Visit  Increased cough x for the past 10 days- prod with white to yellow sputum.     pred still at 10 mg one half each am  Cough worse at hs  But also daytime mostly dry, min mucus and no change in sob or need for saba despite flare   rec zpak and stop spiriva ( it doesn't treat cough, it's for reduced exercise tolerance)  For rattling cough use mucine dm up to 1200 mg every 12 hours and for dry cough delsym  And use the flutter valve as much as you can. If still coughing add pepcid 20 mg about 30-60 min before bedtime  (over the counter) GERD diet      09/29/16 NP rec Begin Caprice Renshaw 1 puff Twice daily , brush rinse and gargle after use. > bothered mouth  Finish Tamiflu and Cipro .  Taper Prednisone as directed to 10mg  1/2 daily .  Follow med calendar closely and bring to each visit.       12/10/2016  f/u ov/Helon Wisinski re: GOLD III copd symbicort 160 2bid / still on prednisone 5 mg daily - 02 hs only x 1lpm  Chief Complaint  Patient presents with  . Follow-up    Pt stopped the Todurza due to it making his mouth hurt, Pt states his breathing is doing well, he still slight cough but non productive, Denies wheezing,chest tightness   mb and back s stopping which is def better and no change off lama and off amb 02   No obvious day to day or daytime variability or assoc excess/ purulent sputum or mucus plugs or hemoptysis or cp or chest tightness, subjective wheeze or overt sinus or hb symptoms. No unusual exp hx or h/o childhood pna/ asthma or knowledge of premature birth.  Sleeping ok without nocturnal  or early am exacerbation  of respiratory  c/o's or need for noct saba. Also denies any obvious fluctuation of symptoms with weather or environmental changes or other aggravating or alleviating factors except as outlined above   Current Medications, Allergies, Complete Past Medical History, Past  Surgical History, Family History, and Social History were reviewed in [employer] record.  ROS  The following are not active complaints unless bolded sore throat, dysphagia, dental problems, itching, sneezing,  nasal congestion or excess/ purulent secretions, ear ache,   fever, chills, sweats, unintended wt loss, classically pleuritic or exertional cp,  orthopnea pnd or leg swelling, presyncope, palpitations, abdominal pain, anorexia, nausea, vomiting, diarrhea  or change in bowel or bladder habits, change in stools or urine, dysuria,hematuria,  rash, arthralgias, visual complaints, headache, numbness, weakness or ataxia or problems with walking or coordination,  change in mood/affect or memory.                       Objective:   Physical Exam  Somber amb wm nad    02/26/2015          210 >  08/27/2015 163 p HD > 09/26/2015  159 > 11/26/2015  160 > 01/07/2016  159 > 04/07/2016  159 > 05/06/2016>   07/21/2016  157 > 08/13/2016  152  > 12/10/2016  150     Vital signs reviewed   -  - Note on arrival 02 sats  97% on RA      HEENT: nl dentition, and oropharynx. Nl external ear canals without cough reflex - mild  bilateral non-specific turbinate edema    NECK :  without JVD/Nodes/TM/ nl  carotid upstrokes bilaterally   LUNGS: no acc muscle use,  Moderately Barrel chest, decreased BS in bases bilaterally but no wheezes/ rhonchi   CV:  RRR  no s3 or murmur or increase in P2, no edema   ABD:  soft and nontender with nl inspiratory excursion in the supine position. No bruits or organomegaly, bowel sounds nl  MS:  Nl gait/ ext warm without deformities, calf tenderness, cyanosis or clubbing No obvious joint restrictions   SKIN: warm and dry without lesions    NEURO:  alert, approp, nl sensorium with  no motor deficits       I personally reviewed images and agree with radiology impression as follows:  CXR:    09/29/16  COPD with bibasilar atelectasis, scarring or fibrosis.

## 2016-12-11 DIAGNOSIS — N186 End stage renal disease: Secondary | ICD-10-CM | POA: Diagnosis not present

## 2016-12-11 DIAGNOSIS — N2581 Secondary hyperparathyroidism of renal origin: Secondary | ICD-10-CM | POA: Diagnosis not present

## 2016-12-11 DIAGNOSIS — Z23 Encounter for immunization: Secondary | ICD-10-CM | POA: Diagnosis not present

## 2016-12-11 DIAGNOSIS — D631 Anemia in chronic kidney disease: Secondary | ICD-10-CM | POA: Diagnosis not present

## 2016-12-11 IMAGING — CR DG CHEST 1V PORT
1 series · 1 of 1 positions shown · non-contrast
Comparison: 06/09/2015

CLINICAL DATA: Left pneumothorax and chest tube. Shortness of
breath.

EXAM:
PORTABLE CHEST 1 VIEW

[AP]
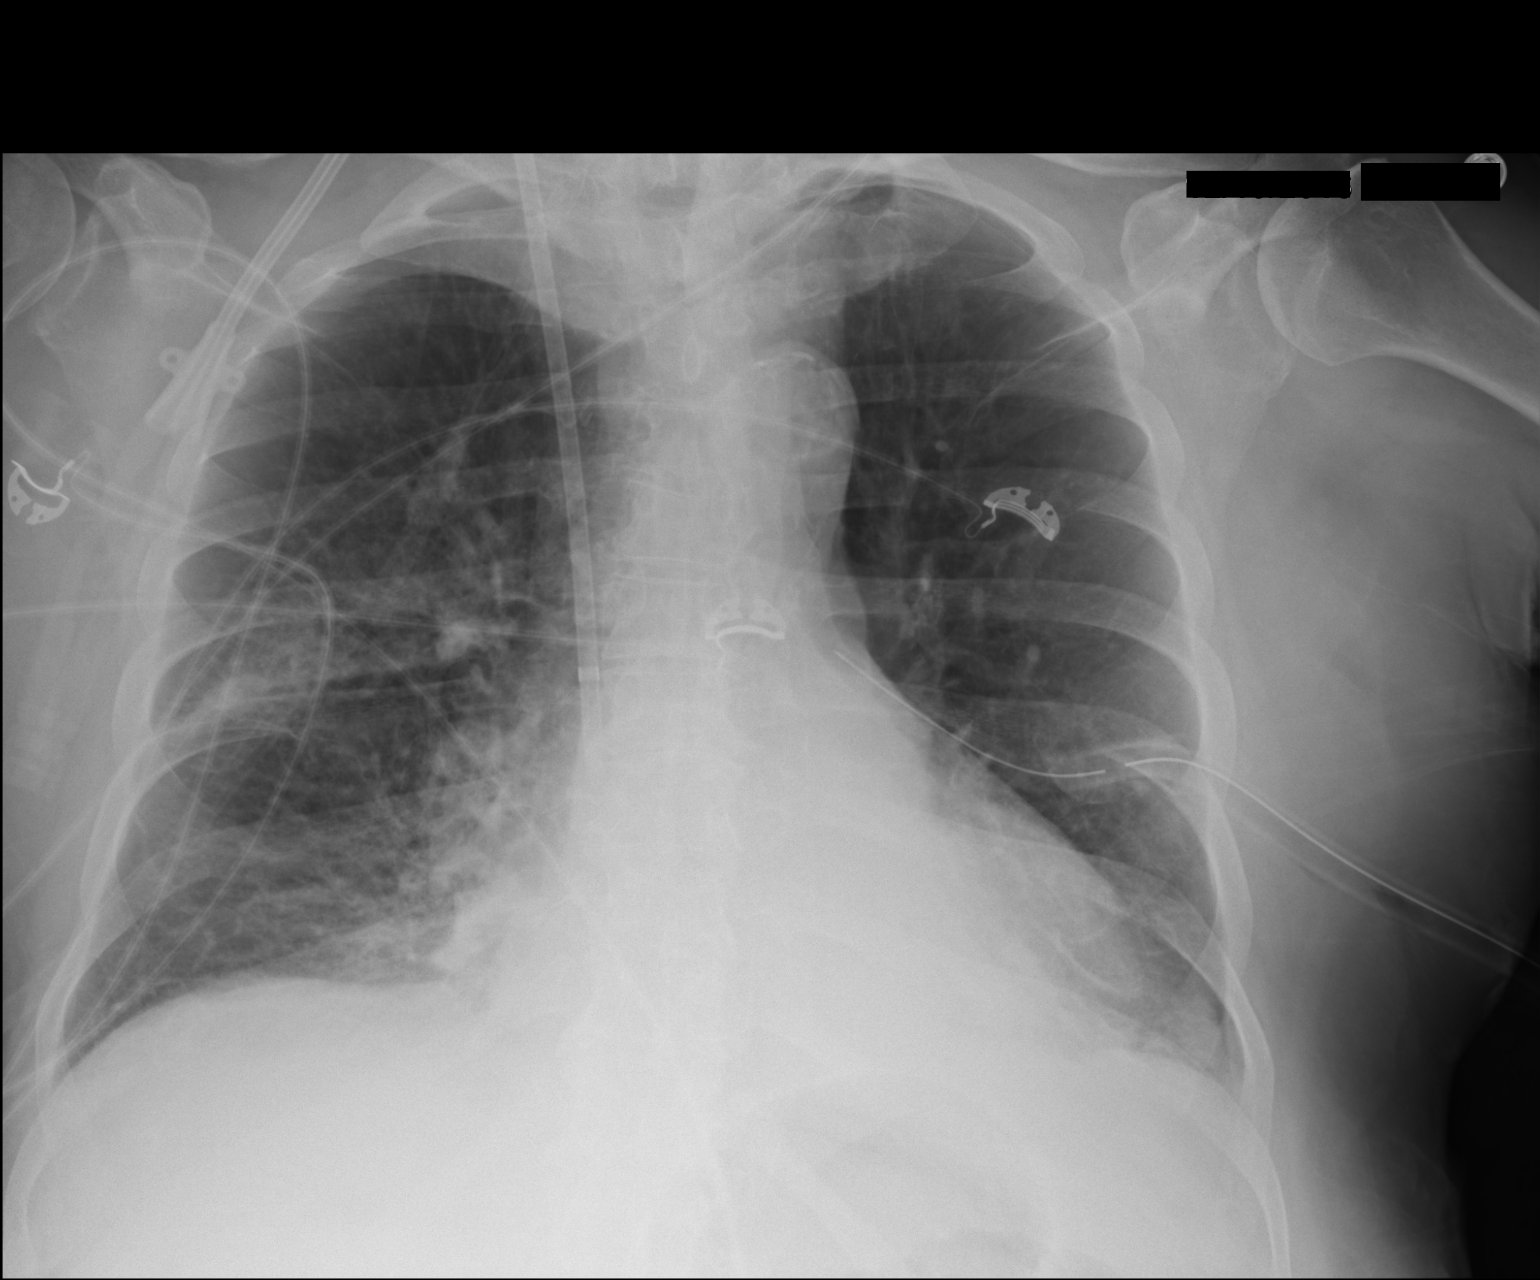

[1 of 1 positions shown; findings below may reference images not displayed]

FINDINGS: Left chest tube remains in place. Small left mid thorax noted. Left
lower lobe atelectasis or infiltrate. Patchy opacities in the right
mid and lower lung have increased since prior study. Heart is upper
limits normal in size.

Interval extubation.  Right dialysis catheter is unchanged.
IMPRESSION: Stable left chest tube position.  Small left apical pneumothorax.

Stable left lower lobe atelectasis or infiltrate. Patchy opacities
in the right mid and lower lung have increased.

## 2016-12-13 IMAGING — CR DG ABD PORTABLE 1V
1 series · 1 of 1 positions shown · non-contrast
Comparison: None.

CLINICAL DATA: Orogastric tube placement

EXAM:
PORTABLE ABDOMEN - 1 VIEW

[AP]
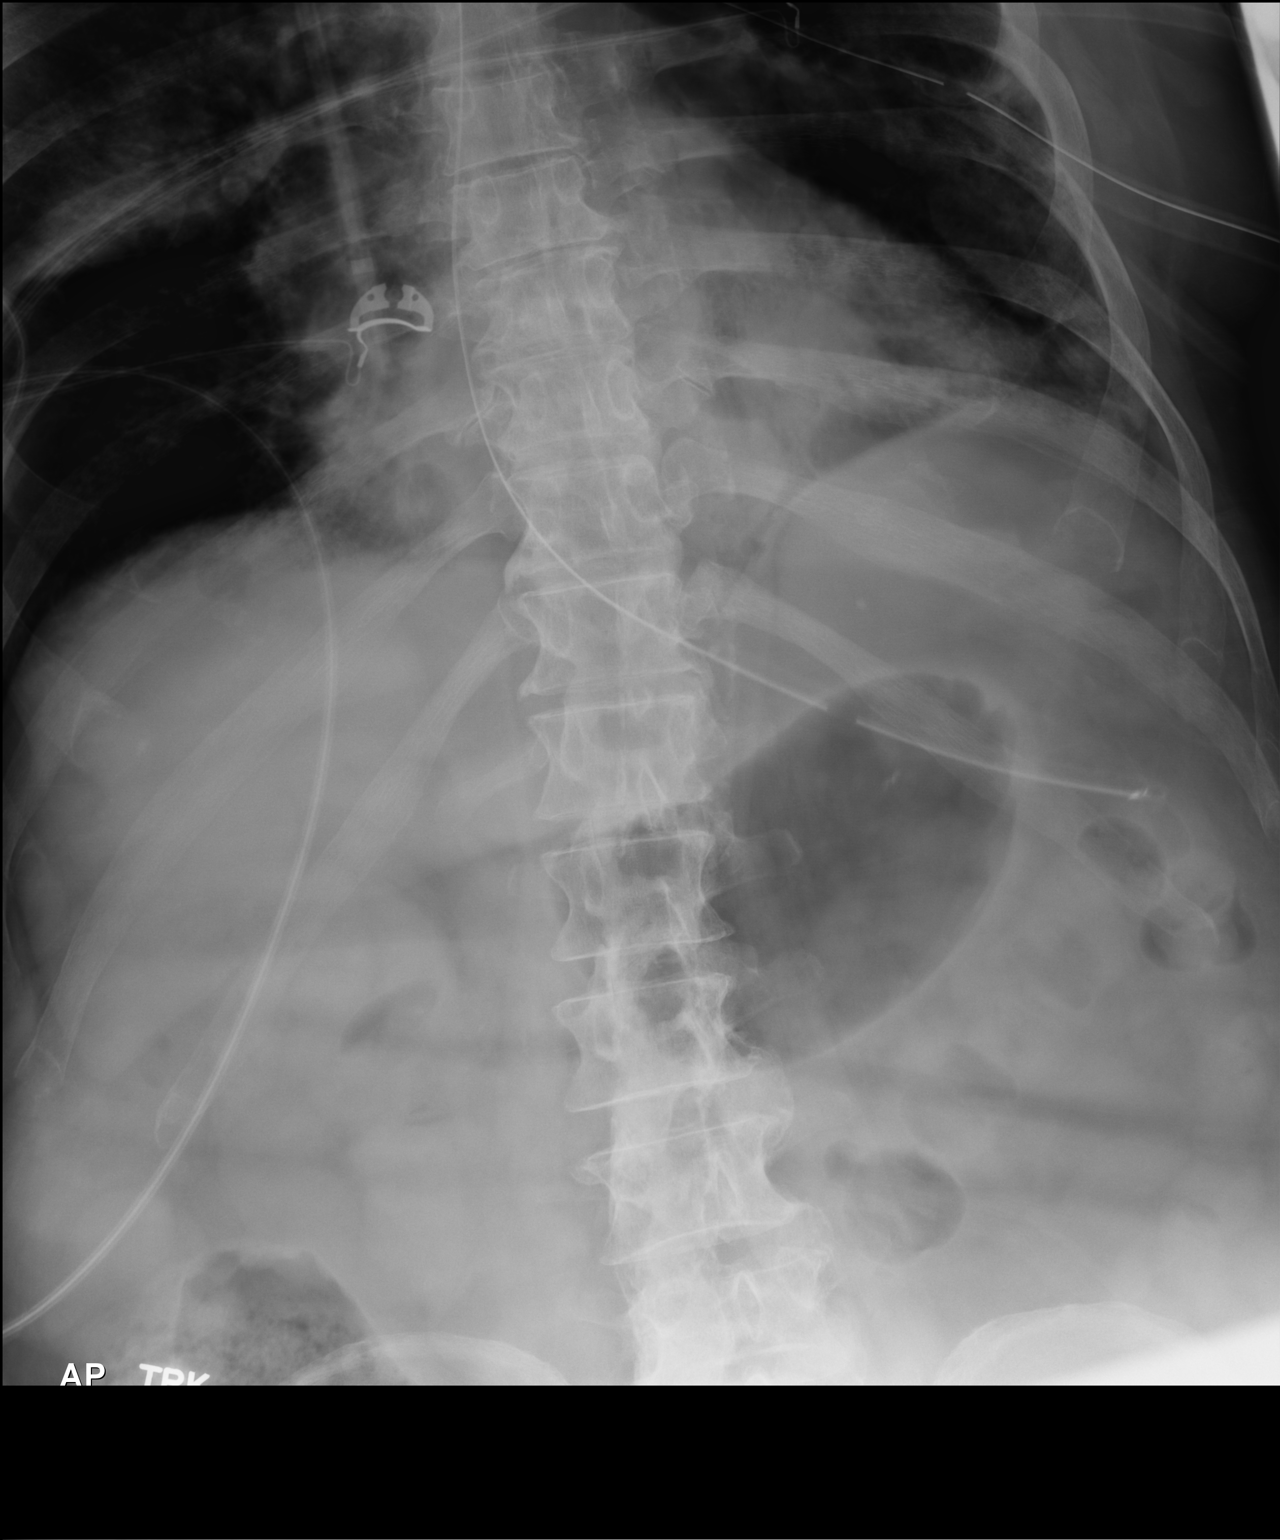

[1 of 1 positions shown; findings below may reference images not displayed]

FINDINGS: Orogastric tube tip and side port are in the stomach. Bowel gas
pattern is unremarkable. There is consolidation in the medial left
lung base.
IMPRESSION: Orogastric tube tip and side port in stomach. Bowel gas pattern
unremarkable. Consolidation medial left lung base.

## 2016-12-13 IMAGING — CR DG CHEST 1V PORT
1 series · 1 of 1 positions shown · non-contrast
Comparison: 06/11/2015.

CLINICAL DATA: Pulmonary edema.

EXAM:
PORTABLE CHEST 1 VIEW

[AP]
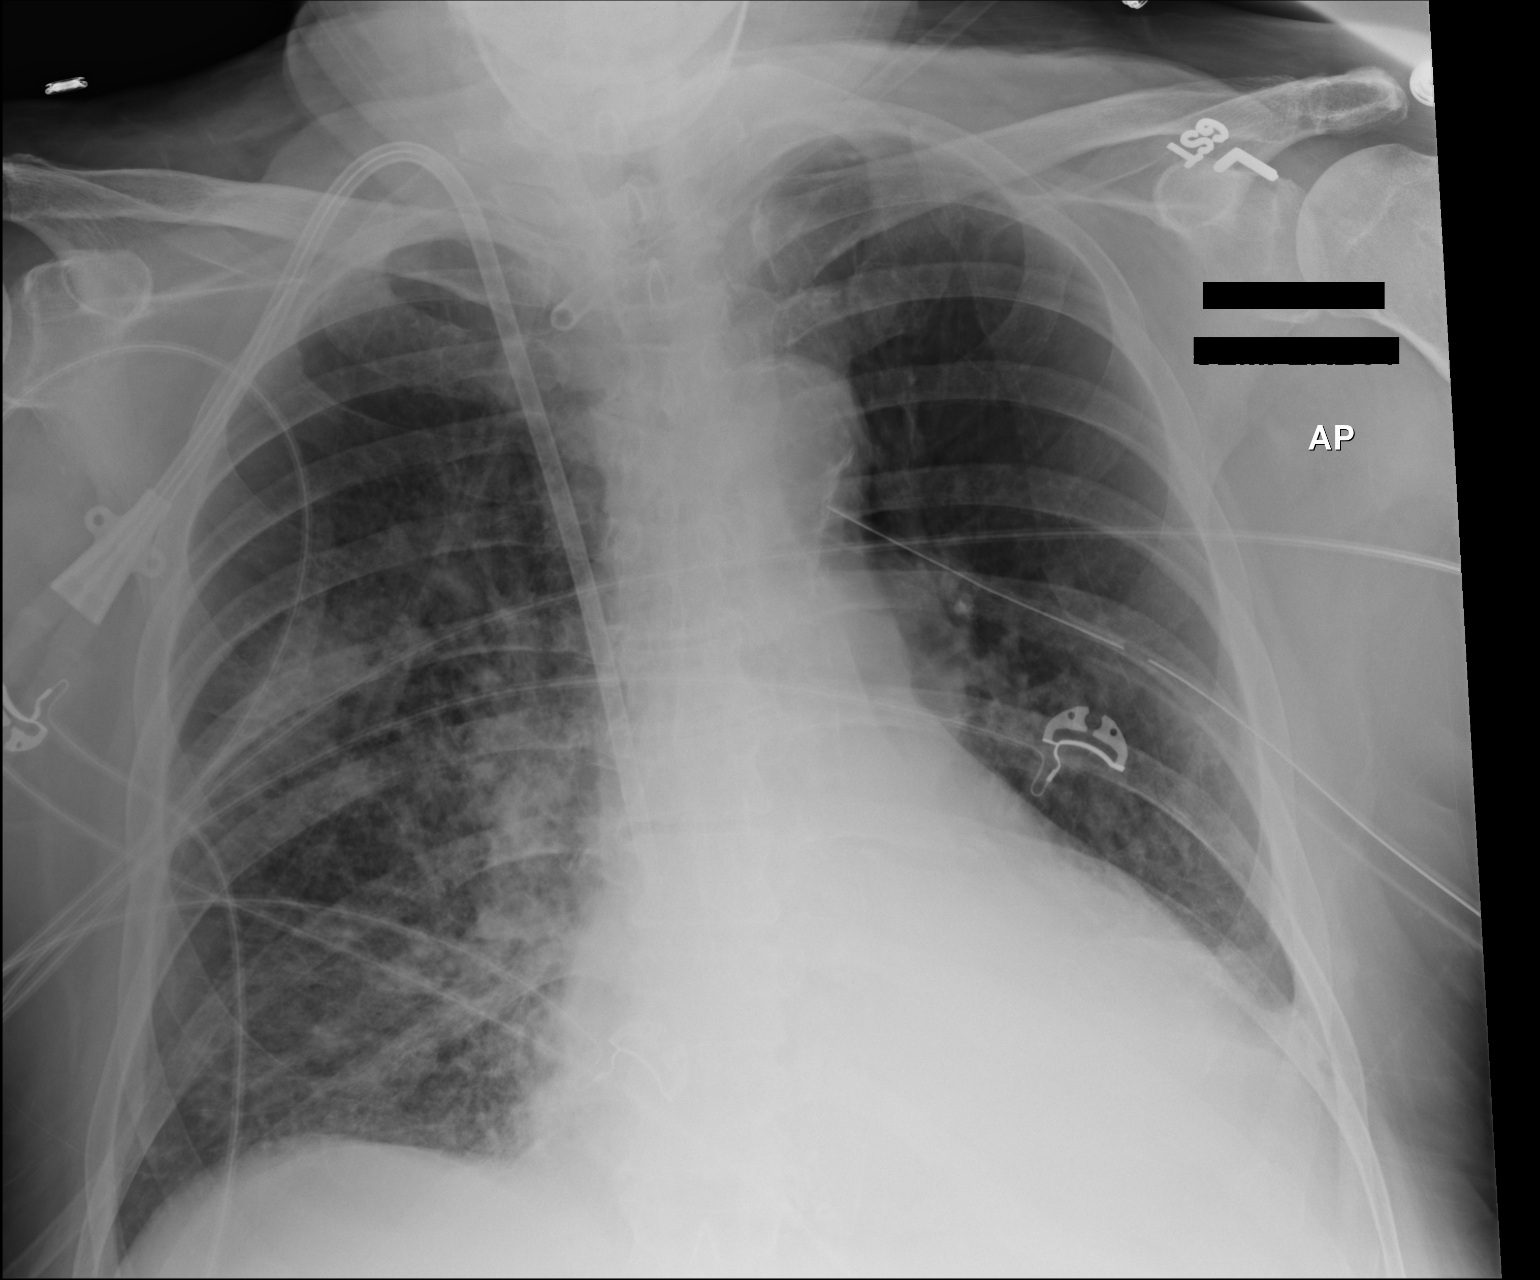

[1 of 1 positions shown; findings below may reference images not displayed]

FINDINGS: Right IJ sheath and left chest tube in stable position. Cardiomegaly
with bilateral pulmonary interstitial prominence, no change from
prior exam. Small left pleural effusion. Tiny left apical
pneumothorax again noted .
IMPRESSION: 1.  Right IJ sheath and left chest tube in stable position.

2. Cardiomegaly with bilateral pulmonary interstitial prominence
consistent congestive heart failure. No interim change from prior
exam. Small left pleural effusion .

3.  Tiny left apical pneumothorax again noted.

Critical Value/emergent results were called by telephone at the time
of interpretation on 06/12/2015 at [DATE] to nurse Garleman, who
verbally acknowledged these results.

## 2016-12-13 NOTE — Assessment & Plan Note (Addendum)
02 rx as of  hosp discharge 08/14/15   As of 12/10/2016 =  1lpm HS only   Notes indicate he has POC also but for now no indication to use it ? D/c at next ov?

## 2016-12-13 NOTE — Assessment & Plan Note (Signed)
-   Trial off ACEi 11/30/2012 >>> much better symptom control - 05/28/2014   Walked RA x one lap @ 185 stopped due to  [employer] / sats ok/ mod pace - PFT's 01/26/13      FEV1  1.28 (48%) ratio 48 and no better p B2,  DLCO 39 corrects to 48%  - PFTs 02/26/2015   FEV1 0.99  (35 % ) ratio 35  p 11 % improvement from saba with DLCO  24 % corrects to 35  % for alv volume  p am symb/spiriva    - Pred maint rx  Since d/c 08/14/15  - 09/26/2015  extensive coaching HFA effectiveness =    75%  - 11/26/2015 rec reduce pred to 5 mg daily   - 05/22/2016  After extensive coaching HFA effectiveness =  90%  - 07/21/2016 try off spiriva due to throat irritation > restarted at rec of wife during cough but did not help it so d/c'd again 08/13/2016 with plan to change to spriva smi if breathing worse   Overall doing gradually better on his present regimen and cannot tolerate dry powdered inhalers - one issue is whether it if he is going to remain on chronic low-dose systemic steroids he would be better off switching to Bevespi instead of Symbicort but for now I recommended continuing present regimen.    Each maintenance medication was reviewed in detail including most importantly the difference between maintenance and as needed and under what circumstances the prns are to be used. This was done in the context of a medication calendar review which provided the patient with a user-friendly unambiguous mechanism for medication administration and reconciliation and provides an action plan for all active problems. It is critical that this be shown to every doctor  for modification during the office visit if necessary so the patient can use it as a working document.    [name]

## 2016-12-14 ENCOUNTER — Telehealth: Payer: Self-pay | Admitting: *Deleted

## 2016-12-14 DIAGNOSIS — Z23 Encounter for immunization: Secondary | ICD-10-CM | POA: Diagnosis not present

## 2016-12-14 DIAGNOSIS — D631 Anemia in chronic kidney disease: Secondary | ICD-10-CM | POA: Diagnosis not present

## 2016-12-14 DIAGNOSIS — N2581 Secondary hyperparathyroidism of renal origin: Secondary | ICD-10-CM | POA: Diagnosis not present

## 2016-12-14 DIAGNOSIS — N186 End stage renal disease: Secondary | ICD-10-CM | POA: Diagnosis not present

## 2016-12-14 NOTE — Telephone Encounter (Signed)
Documented in problem list, still has POC so will inquire ? D/c next ov

## 2016-12-14 NOTE — Telephone Encounter (Signed)
Spoke with the pt  He states he is using his o2 just 1lpm with sleep only  MAR already states this

## 2016-12-14 NOTE — Telephone Encounter (Signed)
-----   Message from Tanda Rockers, MD sent at 12/13/2016 10:58 AM EDT ----- Check with him to see how he's using his 02 now

## 2016-12-16 DIAGNOSIS — Z23 Encounter for immunization: Secondary | ICD-10-CM | POA: Diagnosis not present

## 2016-12-16 DIAGNOSIS — N2581 Secondary hyperparathyroidism of renal origin: Secondary | ICD-10-CM | POA: Diagnosis not present

## 2016-12-16 DIAGNOSIS — N186 End stage renal disease: Secondary | ICD-10-CM | POA: Diagnosis not present

## 2016-12-16 DIAGNOSIS — D631 Anemia in chronic kidney disease: Secondary | ICD-10-CM | POA: Diagnosis not present

## 2016-12-18 DIAGNOSIS — N186 End stage renal disease: Secondary | ICD-10-CM | POA: Diagnosis not present

## 2016-12-18 DIAGNOSIS — Z23 Encounter for immunization: Secondary | ICD-10-CM | POA: Diagnosis not present

## 2016-12-18 DIAGNOSIS — D631 Anemia in chronic kidney disease: Secondary | ICD-10-CM | POA: Diagnosis not present

## 2016-12-18 DIAGNOSIS — N2581 Secondary hyperparathyroidism of renal origin: Secondary | ICD-10-CM | POA: Diagnosis not present

## 2016-12-18 IMAGING — RF DG SWALLOWING FUNCTION - NRPT [facility]
1 series · 18 of 24 positions shown · non-contrast
Comparison: none

[Series 1: run · 13 acquisitions, 18 frames shown]
[im 1/13]
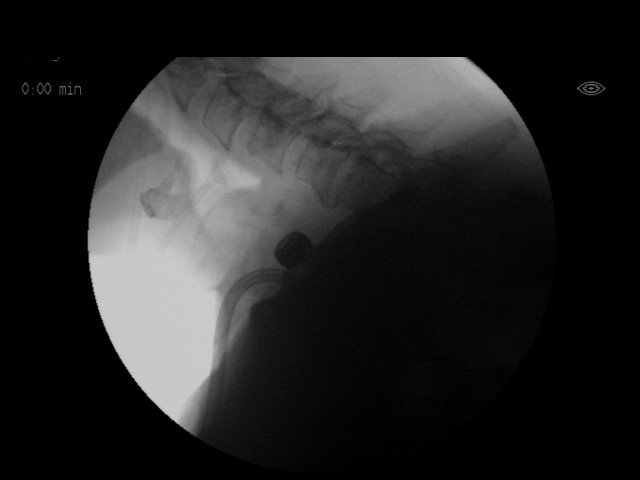
[im 2/13]
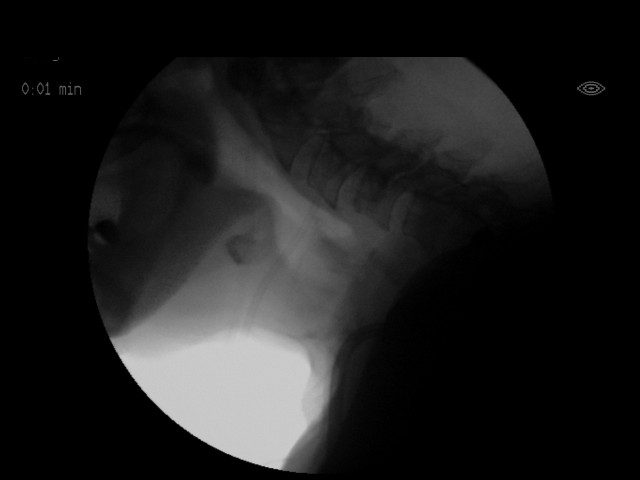
[im 2/13]
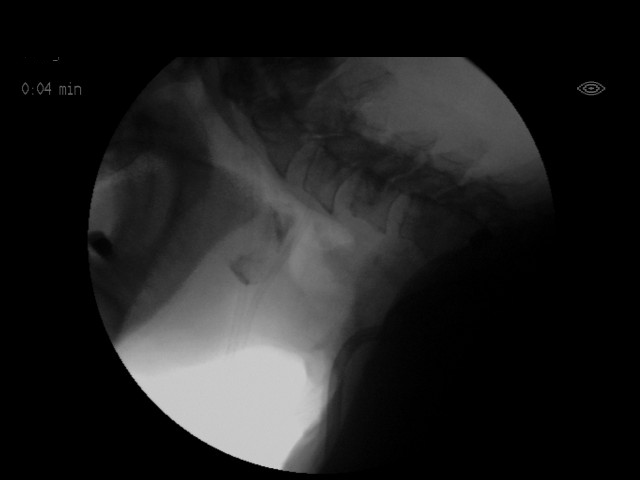
[im 3/13]
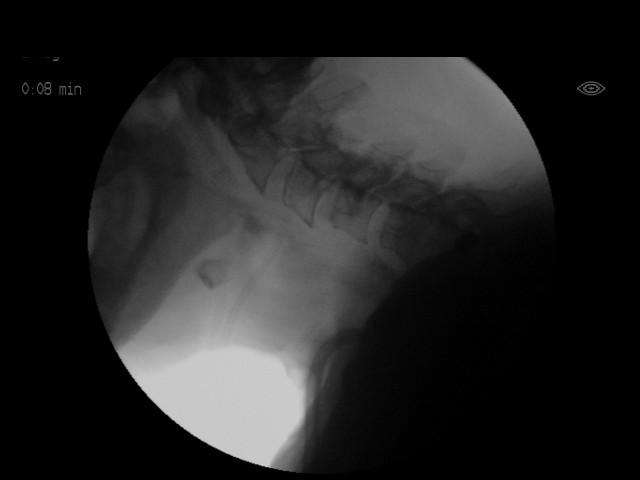
[im 4/13]
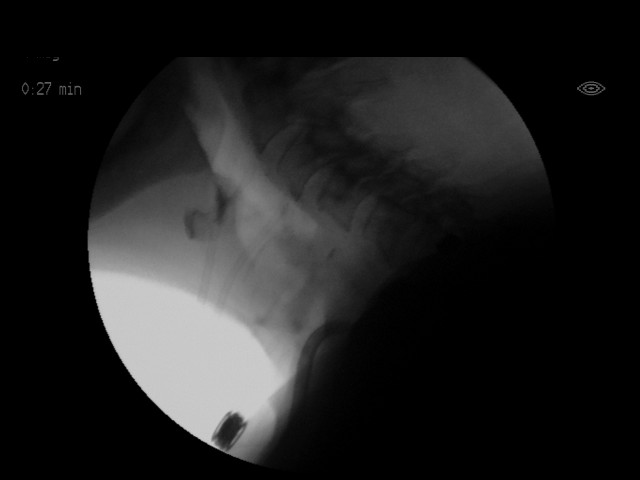
[im 4/13]
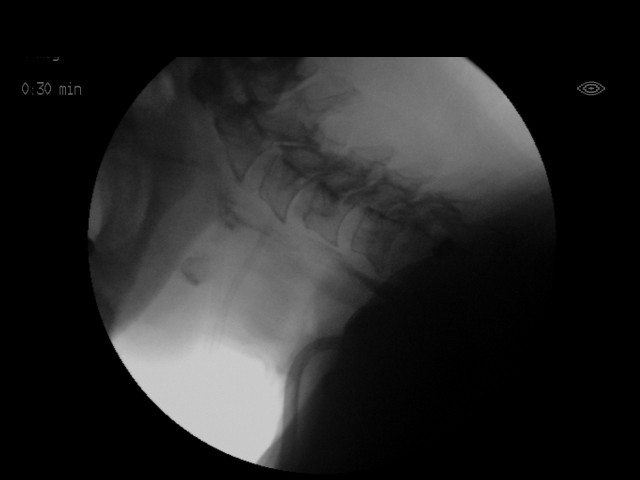
[im 5/13]
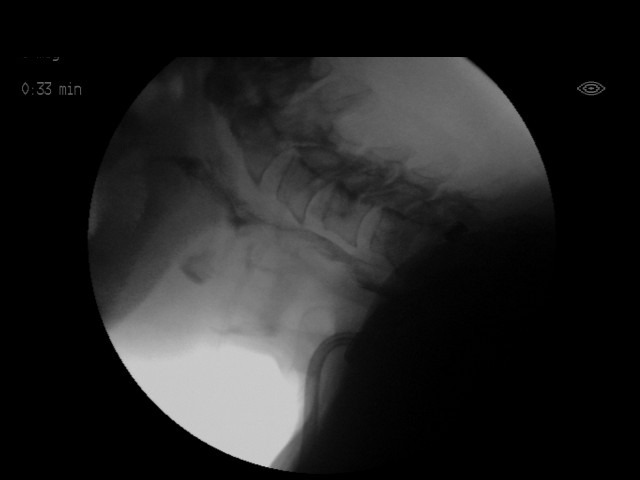
[im 6/13]
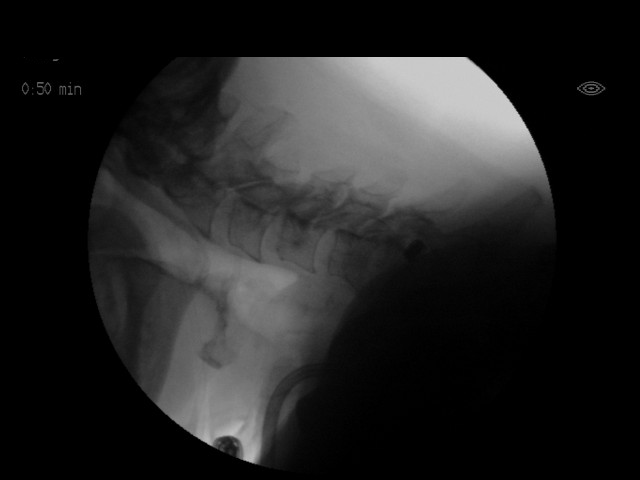
[im 7/13]
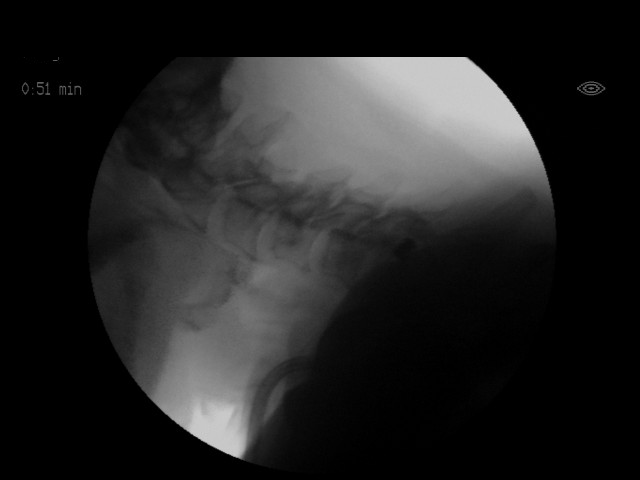
[im 7/13]
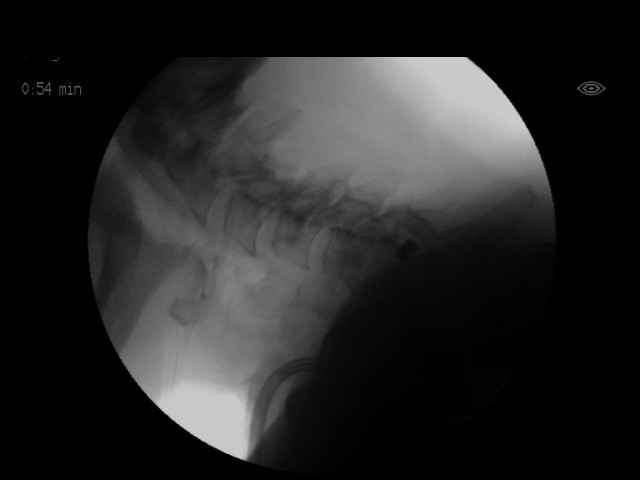
[im 8/13]
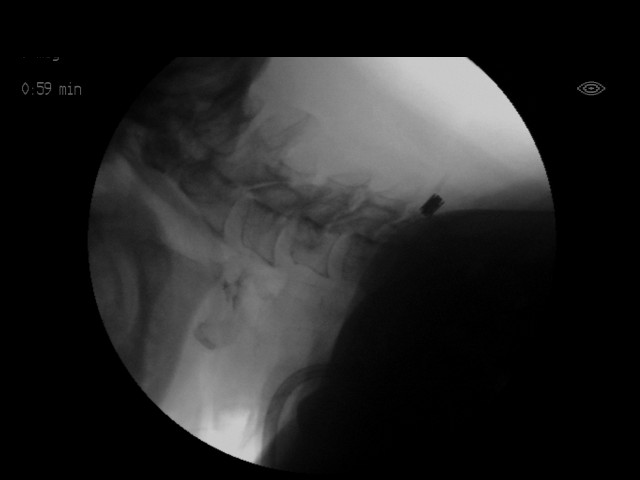
[im 9/13]
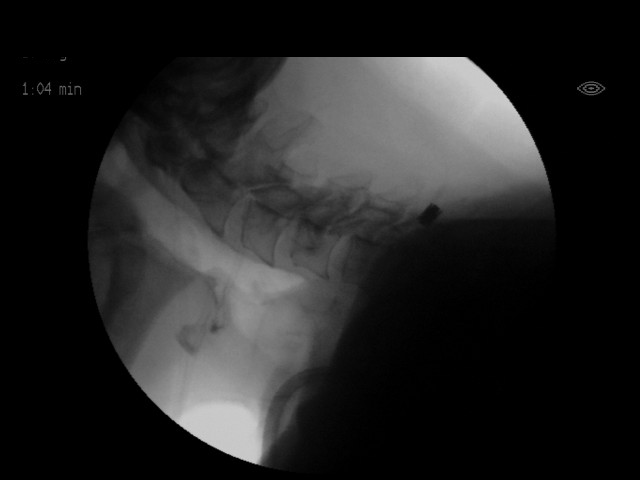
[im 10/13]
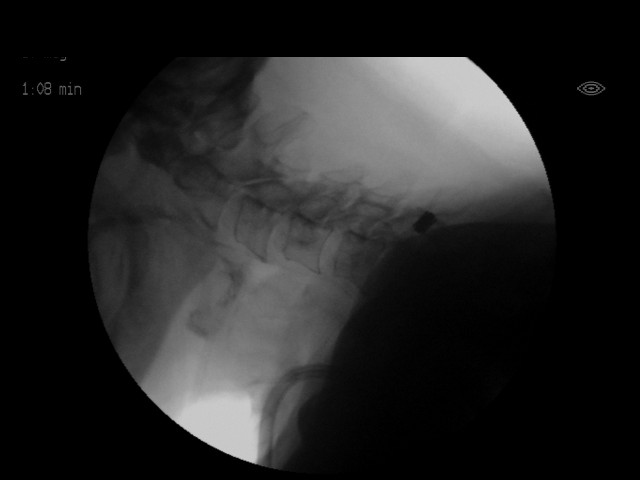
[im 11/13]
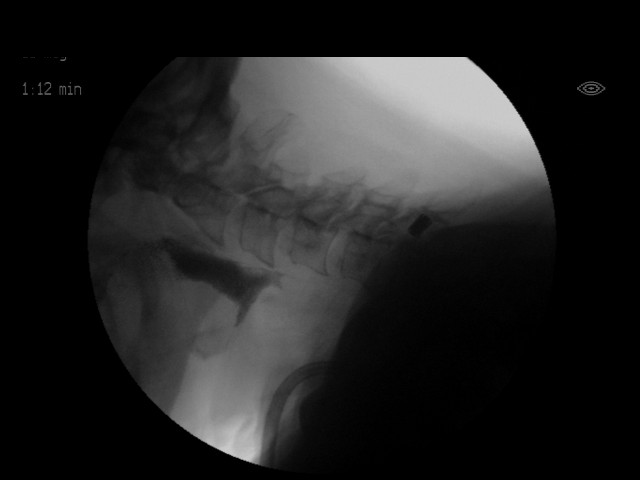
[im 11/13]
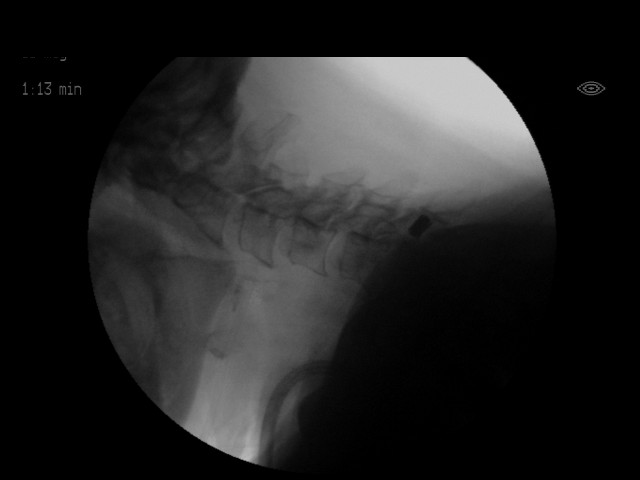
[im 12/13]
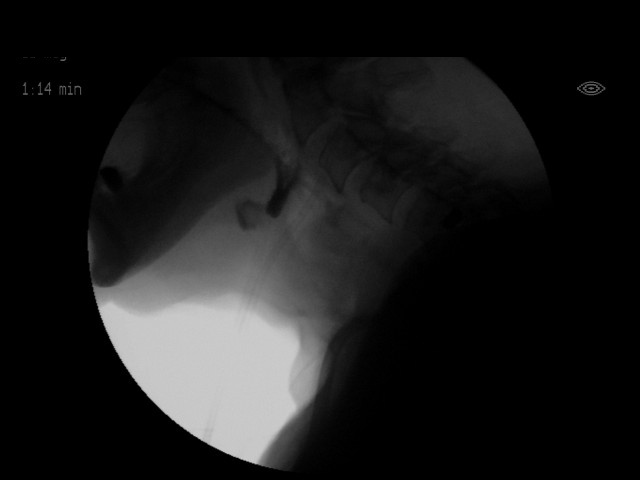
[im 13/13]
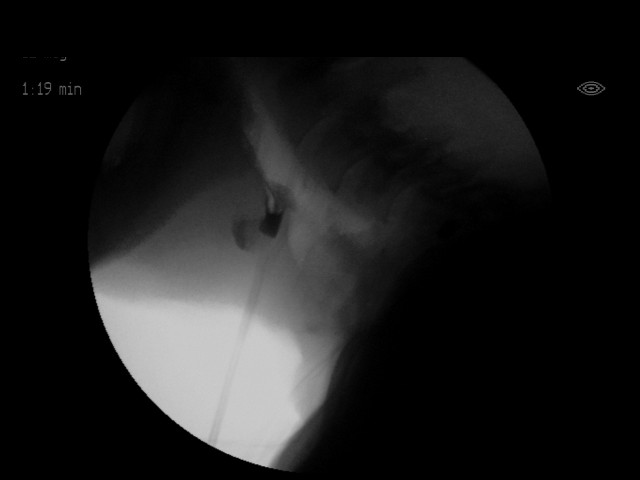
[im 13/13]
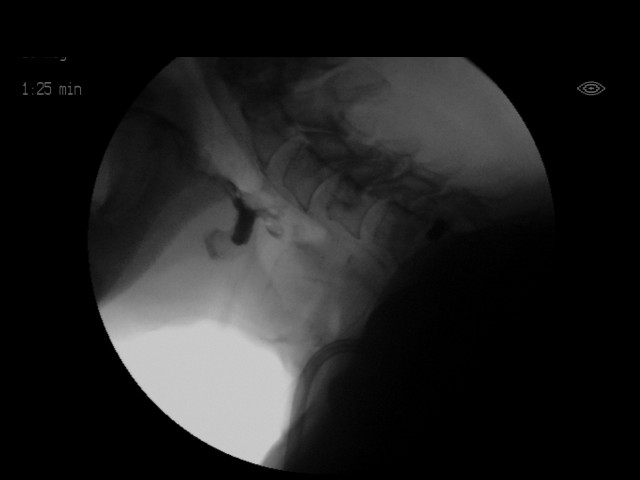

[18 of 24 positions shown; findings below may reference images not displayed]

FLUOROSCOPY FOR SWALLOWING FUNCTION STUDY:
Fluoroscopy was provided for swallowing function study, which was administered by a speech pathologist.  Final results and recommendations from this study are contained within the speech pathology report.

## 2016-12-19 DIAGNOSIS — N186 End stage renal disease: Secondary | ICD-10-CM | POA: Diagnosis not present

## 2016-12-19 DIAGNOSIS — Z992 Dependence on renal dialysis: Secondary | ICD-10-CM | POA: Diagnosis not present

## 2016-12-19 DIAGNOSIS — E1122 Type 2 diabetes mellitus with diabetic chronic kidney disease: Secondary | ICD-10-CM | POA: Diagnosis not present

## 2016-12-22 DIAGNOSIS — Z992 Dependence on renal dialysis: Secondary | ICD-10-CM | POA: Diagnosis not present

## 2016-12-22 DIAGNOSIS — N186 End stage renal disease: Secondary | ICD-10-CM | POA: Diagnosis not present

## 2016-12-25 DIAGNOSIS — N2581 Secondary hyperparathyroidism of renal origin: Secondary | ICD-10-CM | POA: Diagnosis not present

## 2016-12-25 DIAGNOSIS — Z23 Encounter for immunization: Secondary | ICD-10-CM | POA: Diagnosis not present

## 2016-12-25 DIAGNOSIS — N186 End stage renal disease: Secondary | ICD-10-CM | POA: Diagnosis not present

## 2016-12-25 DIAGNOSIS — E1122 Type 2 diabetes mellitus with diabetic chronic kidney disease: Secondary | ICD-10-CM | POA: Diagnosis not present

## 2016-12-25 DIAGNOSIS — D631 Anemia in chronic kidney disease: Secondary | ICD-10-CM | POA: Diagnosis not present

## 2016-12-28 ENCOUNTER — Emergency Department (HOSPITAL_COMMUNITY)
Admission: EM | Admit: 2016-12-28 | Discharge: 2016-12-28 | Disposition: A | Discharge status: Home / Self Care | Payer: Medicare PPO | Attending: Emergency Medicine | Admitting: Emergency Medicine

## 2016-12-28 ENCOUNTER — Encounter (HOSPITAL_COMMUNITY): Payer: Self-pay | Admitting: Emergency Medicine

## 2016-12-28 DIAGNOSIS — T82838A Hemorrhage of vascular prosthetic devices, implants and grafts, initial encounter: Secondary | ICD-10-CM

## 2016-12-28 DIAGNOSIS — Z79899 Other long term (current) drug therapy: Secondary | ICD-10-CM | POA: Diagnosis not present

## 2016-12-28 DIAGNOSIS — Z8673 Personal history of transient ischemic attack (TIA), and cerebral infarction without residual deficits: Secondary | ICD-10-CM | POA: Diagnosis not present

## 2016-12-28 DIAGNOSIS — J449 Chronic obstructive pulmonary disease, unspecified: Secondary | ICD-10-CM | POA: Diagnosis not present

## 2016-12-28 DIAGNOSIS — I120 Hypertensive chronic kidney disease with stage 5 chronic kidney disease or end stage renal disease: Secondary | ICD-10-CM | POA: Diagnosis not present

## 2016-12-28 DIAGNOSIS — Z7982 Long term (current) use of aspirin: Secondary | ICD-10-CM | POA: Insufficient documentation

## 2016-12-28 DIAGNOSIS — Z992 Dependence on renal dialysis: Secondary | ICD-10-CM

## 2016-12-28 DIAGNOSIS — I1311 Hypertensive heart and chronic kidney disease without heart failure, with stage 5 chronic kidney disease, or end stage renal disease: Secondary | ICD-10-CM | POA: Diagnosis not present

## 2016-12-28 DIAGNOSIS — Z452 Encounter for adjustment and management of vascular access device: Secondary | ICD-10-CM | POA: Diagnosis present

## 2016-12-28 DIAGNOSIS — Z23 Encounter for immunization: Secondary | ICD-10-CM | POA: Diagnosis not present

## 2016-12-28 DIAGNOSIS — N186 End stage renal disease: Secondary | ICD-10-CM | POA: Insufficient documentation

## 2016-12-28 DIAGNOSIS — Y828 Other medical devices associated with adverse incidents: Secondary | ICD-10-CM | POA: Diagnosis not present

## 2016-12-28 DIAGNOSIS — Z87891 Personal history of nicotine dependence: Secondary | ICD-10-CM | POA: Diagnosis not present

## 2016-12-28 DIAGNOSIS — D631 Anemia in chronic kidney disease: Secondary | ICD-10-CM | POA: Diagnosis not present

## 2016-12-28 DIAGNOSIS — N2581 Secondary hyperparathyroidism of renal origin: Secondary | ICD-10-CM | POA: Diagnosis not present

## 2016-12-28 DIAGNOSIS — E1122 Type 2 diabetes mellitus with diabetic chronic kidney disease: Secondary | ICD-10-CM | POA: Diagnosis not present

## 2016-12-28 LAB — TYPE AND SCREEN
ABO/RH(D): A POS
ANTIBODY SCREEN: NEGATIVE

## 2016-12-28 LAB — BASIC METABOLIC PANEL
ANION GAP: 11 (ref 5–15)
BUN: 25 mg/dL — ABNORMAL HIGH (ref 6–20)
CHLORIDE: 91 mmol/L — AB (ref 101–111)
CO2: 33 mmol/L — AB (ref 22–32)
Calcium: 8.2 mg/dL — ABNORMAL LOW (ref 8.9–10.3)
Creatinine, Ser: 3.67 mg/dL — ABNORMAL HIGH (ref 0.61–1.24)
GFR calc non Af Amer: 14 mL/min — ABNORMAL LOW (ref >60)
GFR, EST AFRICAN AMERICAN: 17 mL/min — AB (ref >60)
Glucose, Bld: 172 mg/dL — ABNORMAL HIGH (ref 65–99)
Potassium: 3.2 mmol/L — ABNORMAL LOW (ref 3.5–5.1)
Sodium: 135 mmol/L (ref 135–145)

## 2016-12-28 LAB — PROTIME-INR
INR: 0.97
Prothrombin Time: 12.9 seconds (ref 11.4–15.2)

## 2016-12-28 LAB — CBC WITH DIFFERENTIAL/PLATELET
Basophils Absolute: 0 10*3/uL (ref 0.0–0.1)
Basophils Relative: 0 %
EOS PCT: 1 %
Eosinophils Absolute: 0.1 10*3/uL (ref 0.0–0.7)
HEMATOCRIT: 36.2 % — AB (ref 39.0–52.0)
HEMOGLOBIN: 11.4 g/dL — AB (ref 13.0–17.0)
LYMPHS ABS: 1.3 10*3/uL (ref 0.7–4.0)
LYMPHS PCT: 14 %
MCH: 31.1 pg (ref 26.0–34.0)
MCHC: 31.5 g/dL (ref 30.0–36.0)
MCV: 98.9 fL (ref 78.0–100.0)
Monocytes Absolute: 0.8 10*3/uL (ref 0.1–1.0)
Monocytes Relative: 8 %
NEUTROS ABS: 7.2 10*3/uL (ref 1.7–7.7)
NEUTROS PCT: 77 %
Platelets: 220 10*3/uL (ref 150–400)
RBC: 3.66 MIL/uL — AB (ref 4.22–5.81)
RDW: 16.1 % — ABNORMAL HIGH (ref 11.5–15.5)
WBC: 9.4 10*3/uL (ref 4.0–10.5)

## 2016-12-28 NOTE — ED Provider Notes (Signed)
[facility] DEPT Provider Note   CSN: [id number] Arrival date & time: 12/28/16  2034     History   Chief Complaint Chief Complaint  Patient presents with  . Vascular Access Problem    bleeding    HPI Terry [name] is a 82 y.o. male.  The history is provided by the patient.  He has a history of end-stage renal disease on hemodialysis. Today, at the end of dialysis, he was not able to get the puncture site to stop bleeding. He is on aspirin, but no other anticoagulants. This has never happened before.  Past Medical History:  Diagnosis Date  . Acute and chronic respiratory failure 10/22/2012  . Atrial fibrillation ([place])   . CAD (coronary artery disease) [place], [place]  . COPD (chronic obstructive pulmonary disease) ([place])   . Flu 09/24/2016   influenza b  . High cholesterol   . Hypertension   . Kidney disease    CKD4, sees [place] [facility], considering dialysis  . Osteoarthritis 10/22/2012  . Pneumonia April 2014  . Shortness of breath dyspnea   . Stroke [facility]) 2012    Patient Active Problem List   Diagnosis Date Noted  . Upper airway cough syndrome 08/13/2016  . Memory loss of unknown cause 04/08/2016  . Chronic respiratory failure with hypoxia ([place]) 08/27/2015  . ESRD (end stage renal disease) on dialysis ([place]) 07/28/2015  . HCAP (healthcare-associated pneumonia)   . Pneumothorax, left   . Chronic diastolic heart failure ([place])   . Anemia of chronic disease   . Pneumonia due to Enterobacter cloacae ([place])   . Pneumonia due to Klebsiella pneumoniae ([place])   . Chronic atrial fibrillation ([place])   . CAD (coronary artery disease) 02/04/2015  . Hyperkalemia 04/13/2014  . COPD GOLD III 10/22/2012  . Other and unspecified hyperlipidemia 10/22/2012  . Osteoarthritis 10/22/2012  . Essential hypertension 10/22/2012    Past Surgical History:  Procedure Laterality Date  . APPENDECTOMY  1984  . AV FISTULA PLACEMENT Right 05/21/2015   Procedure: Right Arm  Brachiocephalic ARTERIOVENOUS (AV) FISTULA CREATION;  Surgeon: Angelia Mould, MD;  Location: [facility];  Service: Vascular;  Laterality: Right;  . COLON SURGERY  2012   partial colon removed - twisted bowel  . CORONARY STENT PLACEMENT  1999   2 stents  . CYSTOSCOPY/RETROGRADE/URETEROSCOPY Bilateral 04/23/2014   Procedure: CYSTOSCOPY BILATERAL RETROGRADE,  LEFT URETEROSCOPY, LEFT STENT PLACEMENT;  Surgeon: Raynelle Bring, MD;  Location: [facility];  Service: Urology;  Laterality: Bilateral;  . HERNIA REPAIR     Right inguinal       Home Medications    Prior to Admission medications   Medication Sig Start Date End Date Taking? Authorizing Provider  albuterol (VENTOLIN HFA) 108 (90 Base) MCG/ACT inhaler Inhale 2 puffs into the lungs every 4 (four) hours as needed for wheezing or shortness of breath. 08/27/15   Tanda Rockers, MD  aspirin EC 325 MG tablet Take 1 tablet (325 mg total) by mouth daily. 09/26/15   Burnell Blanks, MD  atorvastatin (LIPITOR) 20 MG tablet Take 1 tablet (20 mg total) by mouth daily. 09/21/16   Burnell Blanks, MD  calcium acetate (PHOSLO) 667 MG capsule Take 667-1,334 mg by mouth 3 (three) times daily with meals.  01/09/16   [provider]  cholecalciferol (VITAMIN D) 1000 units tablet Take 1,000 Units by mouth daily.    [provider]  guaiFENesin (MUCINEX) 600 MG 12 hr tablet Take 600 mg by  mouth 2 (two) times daily.    [provider]  ipratropium-albuterol (DUONEB) 0.5-2.5 (3) MG/3ML SOLN Take 3 mLs by nebulization every 4 (four) hours as needed (for breathing).     [provider]  methocarbamol (ROBAXIN) 500 MG tablet Take 500 mg by mouth 2 (two) times daily as needed for muscle spasms.    [provider]  metoprolol succinate (TOPROL-XL) 50 MG 24 hr tablet Take one tablet on non dialysis days and half tablet on dialysis days Patient taking differently: Take 50 mg by mouth 2 (two) times daily.  08/12/16    Burnell Blanks, MD  midodrine (PROAMATINE) 10 MG tablet Take 10 mg by mouth as needed.    [provider]  multivitamin (RENA-VIT) TABS tablet Take 1 tablet by mouth daily.    [provider]  OXYGEN 1 lpm with sleep only    [provider]  predniSONE (DELTASONE) 10 MG tablet Take 0.5 tablets (5 mg total) by mouth daily with breakfast. Take 1/2 tablet by mouth daily 10/22/16   Tanda Rockers, MD  SYMBICORT 160-4.5 MCG/ACT inhaler INHALE 2 PUFFS TWICE DAILY 11/30/16   Tanda Rockers, MD    Family History Family History  Problem Relation Age of Onset  . Cancer Mother        Breast  . Heart disease Father   . Heart attack Father   . Parkinson's disease Brother     Social History Social History  Substance Use Topics  . Smoking status: Former Smoker    Packs/day: 1.00    Years: 49.00    Types: Cigarettes, Pipe, Cigars    Quit date: 02/21/2000  . Smokeless tobacco: Never Used  . Alcohol use No     Allergies   Yellow dyes (non-tartrazine); Levaquin [levofloxacin in d5w]; and Tussionex pennkinetic er [employer] er]   Review of Systems Review of Systems  All other systems reviewed and are negative.    Physical Exam Updated Vital Signs BP (!) 177/56   Pulse 70   Temp 98.3 F (36.8 C) (Oral)   Resp 16   Ht 5\' 8"  (1.727 m)   Wt 70.3 kg (155 lb)   SpO2 97%   BMI 23.57 kg/m   Physical Exam  Nursing note and vitals reviewed.  82 year old male, resting comfortably and in no acute distress. Vital signs are significant for hypertension. Oxygen saturation is 97%, which is normal. Head is normocephalic and atraumatic. PERRLA, EOMI. Oropharynx is clear. Neck is nontender and supple without adenopathy or JVD. Back is nontender and there is no CVA tenderness. Lungs are clear without rales, wheezes, or rhonchi. Chest is nontender. Heart has regular rate and rhythm without murmur. Abdomen is soft, flat, nontender without masses  or hepatosplenomegaly and peristalsis is normoactive. Extremities: AV fistula is present in the right upper arm with thrill present. Puncture site has steady oozing from it. Pressure dressing which had been applied, is soaked with blood. Skin is warm and dry without rash. Neurologic: Mental status is normal, cranial nerves are intact, there are no motor or sensory deficits.  ED Treatments / Results  Labs (all labs ordered are listed, but only abnormal results are displayed) Labs Reviewed  CBC WITH DIFFERENTIAL/PLATELET - Abnormal; Notable for the following:       Result Value   RBC 3.66 (*)    Hemoglobin 11.4 (*)    HCT 36.2 (*)    RDW 16.1 (*)    All other  components within normal limits  BASIC METABOLIC PANEL - Abnormal; Notable for the following:    Potassium 3.2 (*)    Chloride 91 (*)    CO2 33 (*)    Glucose, Bld 172 (*)    BUN 25 (*)    Creatinine, Ser 3.67 (*)    Calcium 8.2 (*)    GFR calc non Af Amer 14 (*)    GFR calc Af Amer 17 (*)    All other components within normal limits  PROTIME-INR  TYPE AND SCREEN   Procedures Procedures (including critical care time)  Medications Ordered in ED Medications - No data to display   Initial Impression / Assessment and Plan / ED Course  I have reviewed the triage vital signs and the nursing notes.  Pertinent lab results that were available during my care of the patient were reviewed by me and considered in my medical decision making (see chart for details).  Direct pressure was applied to the bleeding site. After 15 minutes of direct pressure, bleeding had stopped. New pressure dressing is applied. He is discharged with instructions to keep this dressing in place until the morning. Return if bleeding recurs. Laboratory evaluation showed mild anemia which is unchanged from baseline and is presumably due to chronic renal disease.  Final Clinical Impressions(s) / ED Diagnoses   Final diagnoses:  Bleeding from dialysis shunt,  initial encounter ([place])  ESRD (end stage renal disease) on dialysis ([place])  Anemia in chronic kidney disease, on chronic dialysis [facility])    New Prescriptions New Prescriptions   No medications on file     Delora Fuel, MD 92/44/62 [id number]

## 2016-12-28 NOTE — ED Triage Notes (Signed)
Pt started bleeding at 5 pm today after HD treatment from his AV fistula on the right arm. Pt denies any pain at this time. Pt's arm rapped and bleeding controlled.

## 2016-12-28 NOTE — ED Notes (Signed)
Place co-band on arm to try to stop the bleeding. Pt understood and would let us know if any problem occur at this time.

## 2016-12-28 NOTE — Discharge Instructions (Signed)
Keep dressing on until the morning. Return if bleeding starts again.

## 2016-12-30 DIAGNOSIS — Z23 Encounter for immunization: Secondary | ICD-10-CM | POA: Diagnosis not present

## 2016-12-30 DIAGNOSIS — N186 End stage renal disease: Secondary | ICD-10-CM | POA: Diagnosis not present

## 2016-12-30 DIAGNOSIS — N2581 Secondary hyperparathyroidism of renal origin: Secondary | ICD-10-CM | POA: Diagnosis not present

## 2016-12-30 DIAGNOSIS — E1122 Type 2 diabetes mellitus with diabetic chronic kidney disease: Secondary | ICD-10-CM | POA: Diagnosis not present

## 2016-12-30 DIAGNOSIS — D631 Anemia in chronic kidney disease: Secondary | ICD-10-CM | POA: Diagnosis not present

## 2017-01-01 DIAGNOSIS — Z992 Dependence on renal dialysis: Secondary | ICD-10-CM | POA: Diagnosis not present

## 2017-01-01 DIAGNOSIS — N186 End stage renal disease: Secondary | ICD-10-CM | POA: Diagnosis not present

## 2017-01-01 DIAGNOSIS — N2581 Secondary hyperparathyroidism of renal origin: Secondary | ICD-10-CM | POA: Diagnosis not present

## 2017-01-01 DIAGNOSIS — D631 Anemia in chronic kidney disease: Secondary | ICD-10-CM | POA: Diagnosis not present

## 2017-01-01 DIAGNOSIS — I871 Compression of vein: Secondary | ICD-10-CM | POA: Diagnosis not present

## 2017-01-01 DIAGNOSIS — Z23 Encounter for immunization: Secondary | ICD-10-CM | POA: Diagnosis not present

## 2017-01-01 DIAGNOSIS — T82858A Stenosis of vascular prosthetic devices, implants and grafts, initial encounter: Secondary | ICD-10-CM | POA: Diagnosis not present

## 2017-01-01 DIAGNOSIS — E1122 Type 2 diabetes mellitus with diabetic chronic kidney disease: Secondary | ICD-10-CM | POA: Diagnosis not present

## 2017-01-01 DIAGNOSIS — J449 Chronic obstructive pulmonary disease, unspecified: Secondary | ICD-10-CM | POA: Diagnosis not present

## 2017-01-04 DIAGNOSIS — Z23 Encounter for immunization: Secondary | ICD-10-CM | POA: Diagnosis not present

## 2017-01-04 DIAGNOSIS — E1122 Type 2 diabetes mellitus with diabetic chronic kidney disease: Secondary | ICD-10-CM | POA: Diagnosis not present

## 2017-01-04 DIAGNOSIS — N186 End stage renal disease: Secondary | ICD-10-CM | POA: Diagnosis not present

## 2017-01-04 DIAGNOSIS — D631 Anemia in chronic kidney disease: Secondary | ICD-10-CM | POA: Diagnosis not present

## 2017-01-04 DIAGNOSIS — N2581 Secondary hyperparathyroidism of renal origin: Secondary | ICD-10-CM | POA: Diagnosis not present

## 2017-01-05 ENCOUNTER — Ambulatory Visit: Payer: Medicare PPO | Admitting: Family Medicine

## 2017-01-05 DIAGNOSIS — Z961 Presence of intraocular lens: Secondary | ICD-10-CM | POA: Diagnosis not present

## 2017-01-05 DIAGNOSIS — H43812 Vitreous degeneration, left eye: Secondary | ICD-10-CM | POA: Diagnosis not present

## 2017-01-06 DIAGNOSIS — Z23 Encounter for immunization: Secondary | ICD-10-CM | POA: Diagnosis not present

## 2017-01-06 DIAGNOSIS — N2581 Secondary hyperparathyroidism of renal origin: Secondary | ICD-10-CM | POA: Diagnosis not present

## 2017-01-06 DIAGNOSIS — D631 Anemia in chronic kidney disease: Secondary | ICD-10-CM | POA: Diagnosis not present

## 2017-01-06 DIAGNOSIS — E1122 Type 2 diabetes mellitus with diabetic chronic kidney disease: Secondary | ICD-10-CM | POA: Diagnosis not present

## 2017-01-06 DIAGNOSIS — N186 End stage renal disease: Secondary | ICD-10-CM | POA: Diagnosis not present

## 2017-01-08 DIAGNOSIS — Z992 Dependence on renal dialysis: Secondary | ICD-10-CM | POA: Diagnosis not present

## 2017-01-08 DIAGNOSIS — N186 End stage renal disease: Secondary | ICD-10-CM | POA: Diagnosis not present

## 2017-01-11 DIAGNOSIS — D631 Anemia in chronic kidney disease: Secondary | ICD-10-CM | POA: Diagnosis not present

## 2017-01-11 DIAGNOSIS — E1122 Type 2 diabetes mellitus with diabetic chronic kidney disease: Secondary | ICD-10-CM | POA: Diagnosis not present

## 2017-01-11 DIAGNOSIS — N186 End stage renal disease: Secondary | ICD-10-CM | POA: Diagnosis not present

## 2017-01-11 DIAGNOSIS — Z23 Encounter for immunization: Secondary | ICD-10-CM | POA: Diagnosis not present

## 2017-01-11 DIAGNOSIS — N2581 Secondary hyperparathyroidism of renal origin: Secondary | ICD-10-CM | POA: Diagnosis not present

## 2017-01-13 DIAGNOSIS — N2581 Secondary hyperparathyroidism of renal origin: Secondary | ICD-10-CM | POA: Diagnosis not present

## 2017-01-13 DIAGNOSIS — Z23 Encounter for immunization: Secondary | ICD-10-CM | POA: Diagnosis not present

## 2017-01-13 DIAGNOSIS — E1122 Type 2 diabetes mellitus with diabetic chronic kidney disease: Secondary | ICD-10-CM | POA: Diagnosis not present

## 2017-01-13 DIAGNOSIS — D631 Anemia in chronic kidney disease: Secondary | ICD-10-CM | POA: Diagnosis not present

## 2017-01-13 DIAGNOSIS — N186 End stage renal disease: Secondary | ICD-10-CM | POA: Diagnosis not present

## 2017-01-15 DIAGNOSIS — N186 End stage renal disease: Secondary | ICD-10-CM | POA: Diagnosis not present

## 2017-01-15 DIAGNOSIS — Z992 Dependence on renal dialysis: Secondary | ICD-10-CM | POA: Diagnosis not present

## 2017-01-18 DIAGNOSIS — Z23 Encounter for immunization: Secondary | ICD-10-CM | POA: Diagnosis not present

## 2017-01-18 DIAGNOSIS — N2581 Secondary hyperparathyroidism of renal origin: Secondary | ICD-10-CM | POA: Diagnosis not present

## 2017-01-18 DIAGNOSIS — N186 End stage renal disease: Secondary | ICD-10-CM | POA: Diagnosis not present

## 2017-01-18 DIAGNOSIS — E1122 Type 2 diabetes mellitus with diabetic chronic kidney disease: Secondary | ICD-10-CM | POA: Diagnosis not present

## 2017-01-18 DIAGNOSIS — D631 Anemia in chronic kidney disease: Secondary | ICD-10-CM | POA: Diagnosis not present

## 2017-01-19 DIAGNOSIS — N186 End stage renal disease: Secondary | ICD-10-CM | POA: Diagnosis not present

## 2017-01-19 DIAGNOSIS — Z992 Dependence on renal dialysis: Secondary | ICD-10-CM | POA: Diagnosis not present

## 2017-01-19 DIAGNOSIS — H4302 Vitreous prolapse, left eye: Secondary | ICD-10-CM | POA: Diagnosis not present

## 2017-01-19 DIAGNOSIS — T8529XA Other mechanical complication of intraocular lens, initial encounter: Secondary | ICD-10-CM | POA: Diagnosis not present

## 2017-01-19 DIAGNOSIS — E1122 Type 2 diabetes mellitus with diabetic chronic kidney disease: Secondary | ICD-10-CM | POA: Diagnosis not present

## 2017-01-19 DIAGNOSIS — H4052X4 Glaucoma secondary to other eye disorders, left eye, indeterminate stage: Secondary | ICD-10-CM | POA: Diagnosis not present

## 2017-01-19 DIAGNOSIS — Z961 Presence of intraocular lens: Secondary | ICD-10-CM | POA: Diagnosis not present

## 2017-01-20 DIAGNOSIS — D631 Anemia in chronic kidney disease: Secondary | ICD-10-CM | POA: Diagnosis not present

## 2017-01-20 DIAGNOSIS — N186 End stage renal disease: Secondary | ICD-10-CM | POA: Diagnosis not present

## 2017-01-20 DIAGNOSIS — Z992 Dependence on renal dialysis: Secondary | ICD-10-CM | POA: Diagnosis not present

## 2017-01-20 DIAGNOSIS — N2581 Secondary hyperparathyroidism of renal origin: Secondary | ICD-10-CM | POA: Diagnosis not present

## 2017-01-22 DIAGNOSIS — Z992 Dependence on renal dialysis: Secondary | ICD-10-CM | POA: Diagnosis not present

## 2017-01-22 DIAGNOSIS — N2581 Secondary hyperparathyroidism of renal origin: Secondary | ICD-10-CM | POA: Diagnosis not present

## 2017-01-22 DIAGNOSIS — D631 Anemia in chronic kidney disease: Secondary | ICD-10-CM | POA: Diagnosis not present

## 2017-01-22 DIAGNOSIS — N186 End stage renal disease: Secondary | ICD-10-CM | POA: Diagnosis not present

## 2017-01-25 DIAGNOSIS — N186 End stage renal disease: Secondary | ICD-10-CM | POA: Diagnosis not present

## 2017-01-25 DIAGNOSIS — D631 Anemia in chronic kidney disease: Secondary | ICD-10-CM | POA: Diagnosis not present

## 2017-01-25 DIAGNOSIS — Z992 Dependence on renal dialysis: Secondary | ICD-10-CM | POA: Diagnosis not present

## 2017-01-25 DIAGNOSIS — N2581 Secondary hyperparathyroidism of renal origin: Secondary | ICD-10-CM | POA: Diagnosis not present

## 2017-01-27 DIAGNOSIS — N2581 Secondary hyperparathyroidism of renal origin: Secondary | ICD-10-CM | POA: Diagnosis not present

## 2017-01-27 DIAGNOSIS — D631 Anemia in chronic kidney disease: Secondary | ICD-10-CM | POA: Diagnosis not present

## 2017-01-27 DIAGNOSIS — Z992 Dependence on renal dialysis: Secondary | ICD-10-CM | POA: Diagnosis not present

## 2017-01-27 DIAGNOSIS — N186 End stage renal disease: Secondary | ICD-10-CM | POA: Diagnosis not present

## 2017-01-28 DIAGNOSIS — H4302 Vitreous prolapse, left eye: Secondary | ICD-10-CM | POA: Diagnosis not present

## 2017-01-29 DIAGNOSIS — Z992 Dependence on renal dialysis: Secondary | ICD-10-CM | POA: Diagnosis not present

## 2017-01-29 DIAGNOSIS — N186 End stage renal disease: Secondary | ICD-10-CM | POA: Diagnosis not present

## 2017-01-29 DIAGNOSIS — D631 Anemia in chronic kidney disease: Secondary | ICD-10-CM | POA: Diagnosis not present

## 2017-01-29 DIAGNOSIS — N2581 Secondary hyperparathyroidism of renal origin: Secondary | ICD-10-CM | POA: Diagnosis not present

## 2017-02-01 DIAGNOSIS — N186 End stage renal disease: Secondary | ICD-10-CM | POA: Diagnosis not present

## 2017-02-01 DIAGNOSIS — Z992 Dependence on renal dialysis: Secondary | ICD-10-CM | POA: Diagnosis not present

## 2017-02-01 DIAGNOSIS — D631 Anemia in chronic kidney disease: Secondary | ICD-10-CM | POA: Diagnosis not present

## 2017-02-01 DIAGNOSIS — J449 Chronic obstructive pulmonary disease, unspecified: Secondary | ICD-10-CM | POA: Diagnosis not present

## 2017-02-01 DIAGNOSIS — N2581 Secondary hyperparathyroidism of renal origin: Secondary | ICD-10-CM | POA: Diagnosis not present

## 2017-02-02 ENCOUNTER — Ambulatory Visit (INDEPENDENT_AMBULATORY_CARE_PROVIDER_SITE_OTHER): Payer: Medicare PPO | Admitting: Pulmonary Disease

## 2017-02-02 ENCOUNTER — Ambulatory Visit (INDEPENDENT_AMBULATORY_CARE_PROVIDER_SITE_OTHER)
Admission: RE | Admit: 2017-02-02 | Discharge: 2017-02-02 | Disposition: A | Discharge status: Home / Self Care | Payer: Medicare PPO | Source: Ambulatory Visit | Attending: Pulmonary Disease | Admitting: Pulmonary Disease

## 2017-02-02 ENCOUNTER — Encounter: Payer: Self-pay | Admitting: Pulmonary Disease

## 2017-02-02 ENCOUNTER — Telehealth: Payer: Self-pay | Admitting: Internal Medicine

## 2017-02-02 VITALS — BP 140/72 | HR 78 | Ht 68.0 in | Wt 152.0 lb

## 2017-02-02 DIAGNOSIS — R0602 Shortness of breath: Secondary | ICD-10-CM

## 2017-02-02 DIAGNOSIS — R079 Chest pain, unspecified: Secondary | ICD-10-CM | POA: Diagnosis not present

## 2017-02-02 DIAGNOSIS — R05 Cough: Secondary | ICD-10-CM | POA: Diagnosis not present

## 2017-02-02 MED ORDER — PREDNISONE 10 MG PO TABS: ORAL_TABLET | ORAL | 0 refills | Status: DC

## 2017-02-02 NOTE — Patient Instructions (Signed)
We will get a chest x-ray today for further evaluation of dyspnea and chest pain We'll start prednisone taper starting at 40 mg. Reduce dose by 10 mg every 2 days till you reach your baseline dose of 5 mg Continue inhalers as prescribed. Start using the mucinex and flutter valve  Follow up with Dr. Melvyn Novas

## 2017-02-02 NOTE — Telephone Encounter (Signed)
Pt's wife calling back about getting her husband seen today.

## 2017-02-02 NOTE — Progress Notes (Signed)
Terry [name]    [id number]    1934/10/22  Primary Care Physician:Kim, Nickola Major, DO  Referring Physician: [name], DO [address] [place], [place] [address]  Chief complaint:  Acute visit for dyspnea  HPI: 82 year old with COPD. Here with complaints of shortness of breath for the past 2 days. Associated with left chest discomfort, tightness and taking deep breaths. He denies any wheezing. He has chronic cough with congestion. Denies any colored sputum, fevers, chills.  [place] the beach about 3 [place] with a car journey  For 5 hrs but the onset of symptoms are not temporally related to the trip.  Outpatient Encounter Prescriptions as of 02/02/2017  Medication Sig  . albuterol (VENTOLIN HFA) 108 (90 Base) MCG/ACT inhaler Inhale 2 puffs into the lungs every 4 (four) hours as needed for wheezing or shortness of breath.  [name] aspirin EC 325 MG tablet Take 1 tablet (325 mg total) by mouth daily.  [name] atorvastatin (LIPITOR) 20 MG tablet Take 1 tablet (20 mg total) by mouth daily.  . calcium acetate (PHOSLO) 667 MG capsule Take 667-1,334 mg by mouth 3 (three) times daily with meals.   . cholecalciferol (VITAMIN D) 1000 units tablet Take 1,000 Units by mouth daily.  [name] guaiFENesin (MUCINEX) 600 MG 12 hr tablet Take 600 mg by mouth 2 (two) times daily.  [name] ipratropium-albuterol (DUONEB) 0.5-2.5 (3) MG/3ML SOLN Take 3 mLs by nebulization every 4 (four) hours as needed (for breathing).   [name] latanoprost (XALATAN) 0.005 % ophthalmic solution   . methocarbamol (ROBAXIN) 500 MG tablet Take 500 mg by mouth 2 (two) times daily as needed for muscle spasms.  . metoprolol succinate (TOPROL-XL) 50 MG 24 hr tablet Take one tablet on non dialysis days and half tablet on dialysis days (Patient taking differently: Take 50 mg by mouth 2 (two) times daily. )  . midodrine (PROAMATINE) 10 MG tablet Take 10 mg by mouth as needed.  . multivitamin (RENA-VIT) TABS tablet Take 1 tablet by mouth  daily.  . OXYGEN 1 lpm with sleep only  . prednisoLONE acetate (PRED FORTE) 1 % ophthalmic suspension   . predniSONE (DELTASONE) 10 MG tablet Take 0.5 tablets (5 mg total) by mouth daily with breakfast. Take 1/2 tablet by mouth daily  . SYMBICORT 160-4.5 MCG/ACT inhaler INHALE 2 PUFFS TWICE DAILY   No facility-administered encounter medications on file as of 02/02/2017.     Allergies as of 02/02/2017 - Review Complete 02/02/2017  Allergen Reaction Noted  . Yellow dyes (non-tartrazine) Other (See Comments) 04/22/2014  . Levaquin [levofloxacin in d5w] Itching 05/28/2014  . Tussionex pennkinetic er [hydrocod polst-cpm polst er] Other (See Comments) 08/14/2015    Past Medical History:  Diagnosis Date  . Acute and chronic respiratory failure 10/22/2012  . Atrial fibrillation ([place])   . CAD (coronary artery disease) [place], [place]  . COPD (chronic obstructive pulmonary disease) ([place])   . Flu 09/24/2016   influenza b  . High cholesterol   . Hypertension   . Kidney disease    CKD4, sees [place] [facility], considering dialysis  . Osteoarthritis 10/22/2012  . Pneumonia April 2014  . Shortness of breath dyspnea   . Stroke [facility]) 2012    Past Surgical History:  Procedure Laterality Date  . APPENDECTOMY  1984  . AV FISTULA PLACEMENT Right 05/21/2015   Procedure: Right Arm Brachiocephalic ARTERIOVENOUS (AV) FISTULA CREATION;  Surgeon: Angelia Mould, MD;  Location: [facility];  Service: Vascular;  Laterality: Right;  . COLON SURGERY  2012   partial colon removed - twisted bowel  . CORONARY STENT PLACEMENT  1999   2 stents  . CYSTOSCOPY/RETROGRADE/URETEROSCOPY Bilateral 04/23/2014   Procedure: CYSTOSCOPY BILATERAL RETROGRADE,  LEFT URETEROSCOPY, LEFT STENT PLACEMENT;  Surgeon: Raynelle Bring, MD;  Location: [facility];  Service: Urology;  Laterality: Bilateral;  . HERNIA REPAIR     Right inguinal    Family History  Problem Relation Age of Onset  . Cancer Mother        Breast  . Heart  disease Father   . Heart attack Father   . Parkinson's disease Brother     Social History   Social History  . Marital status: Married    Spouse name: N/A  . Number of children: 3  . Years of education: N/A   Occupational History  . retired-worked in radio Retired   Social History Main Topics  . Smoking status: Former Smoker    Packs/day: 1.00    Years: 49.00    Types: Cigarettes, Pipe, Cigars    Quit date: 02/21/2000  . Smokeless tobacco: Never Used  . Alcohol use No  . Drug use: No  . Sexual activity: Not on file   Other Topics Concern  . Not on file   Social History Narrative   Work or School: retired [place] Situation: lives with wife in [place] Beliefs: [place] regular; poor diet             Review of systems: Review of Systems  Constitutional: Negative for fever and chills.  HENT: Negative.   Eyes: Negative for blurred vision.  Respiratory: as per HPI  Cardiovascular: Negative for chest pain and palpitations.  Gastrointestinal: Negative for vomiting, diarrhea, blood per rectum. Genitourinary: Negative for dysuria, urgency, frequency and hematuria.  Musculoskeletal: Negative for myalgias, back pain and joint pain.  Skin: Negative for itching and rash.  Neurological: Negative for dizziness, tremors, focal weakness, seizures and loss of consciousness.  Endo/Heme/Allergies: Negative for environmental allergies.  Psychiatric/Behavioral: Negative for depression, suicidal ideas and hallucinations.  All other systems reviewed and are negative.  Physical Exam: Blood pressure 140/72, pulse 78, height 5\' 8"  (1.727 m), weight 152 lb (68.9 kg), SpO2 93 %. Gen:      No acute distress HEENT:  EOMI, sclera anicteric Neck:     No masses; no thyromegaly Lungs:    Basal crackles, no wheeze; normal respiratory effort CV:         Regular rate and rhythm; no murmurs Abd:      + bowel sounds; soft, non-tender; no  palpable masses, no distension Ext:    No edema; adequate peripheral perfusion Skin:      Warm and dry; no rash Neuro: alert and oriented x 3 Psych: normal mood and affect  Data Reviewed: PFTs 9/6/1 6 FVC 2.83 [76%] FEV1 0.99 (37%) F/F 35 TLC 100% DLCO 24% Severe obstruction with severe diffusion defect.  Chest x-ray 09/29/16- hyperinflation, bibasal atelectasis I have reviewed the images personally.  Assessment:  Severe COPD Acute visit for shortness of breath, chest discomfort.  We will get a chest x-ray today. He doesn't appear to be infected so we will hold off on the antibiotic unless the x-ray shows any infiltrate He'll continue on his inhalers. I'll give him a short prednisone taper starting at 40  mg. I have asked him to start Mucinex twice a day and start using the flutter valve. We considered PE but symptoms are not temporally related to his car trip  Plan/Recommendations: - CXR - Pred taper - Mucinex bid, flutter valve  [name] MD [facility] Pager [phone] 02/02/2017, 4:10 PM  CC: [name], DO

## 2017-02-02 NOTE — Telephone Encounter (Signed)
Called and spoke to pt's wife. Pt states pt is having an increase in SOB and chest tightness. Appt made with PM today at 3:30. Pt's wife verbalized understanding and denied any further questions or concerns at this time.

## 2017-02-03 ENCOUNTER — Other Ambulatory Visit: Payer: Self-pay | Admitting: *Deleted

## 2017-02-03 DIAGNOSIS — N186 End stage renal disease: Secondary | ICD-10-CM | POA: Diagnosis not present

## 2017-02-03 DIAGNOSIS — N2581 Secondary hyperparathyroidism of renal origin: Secondary | ICD-10-CM | POA: Diagnosis not present

## 2017-02-03 DIAGNOSIS — D631 Anemia in chronic kidney disease: Secondary | ICD-10-CM | POA: Diagnosis not present

## 2017-02-03 DIAGNOSIS — Z992 Dependence on renal dialysis: Secondary | ICD-10-CM | POA: Diagnosis not present

## 2017-02-03 MED ORDER — AZITHROMYCIN 250 MG PO TABS: ORAL_TABLET | ORAL | 0 refills | Status: AC

## 2017-02-05 DIAGNOSIS — D631 Anemia in chronic kidney disease: Secondary | ICD-10-CM | POA: Diagnosis not present

## 2017-02-05 DIAGNOSIS — N186 End stage renal disease: Secondary | ICD-10-CM | POA: Diagnosis not present

## 2017-02-05 DIAGNOSIS — N2581 Secondary hyperparathyroidism of renal origin: Secondary | ICD-10-CM | POA: Diagnosis not present

## 2017-02-05 DIAGNOSIS — Z992 Dependence on renal dialysis: Secondary | ICD-10-CM | POA: Diagnosis not present

## 2017-02-08 ENCOUNTER — Telehealth: Payer: Self-pay | Admitting: Internal Medicine

## 2017-02-08 DIAGNOSIS — N2581 Secondary hyperparathyroidism of renal origin: Secondary | ICD-10-CM | POA: Diagnosis not present

## 2017-02-08 DIAGNOSIS — N186 End stage renal disease: Secondary | ICD-10-CM | POA: Diagnosis not present

## 2017-02-08 DIAGNOSIS — Z992 Dependence on renal dialysis: Secondary | ICD-10-CM | POA: Diagnosis not present

## 2017-02-08 DIAGNOSIS — D631 Anemia in chronic kidney disease: Secondary | ICD-10-CM | POA: Diagnosis not present

## 2017-02-08 NOTE — Telephone Encounter (Signed)
Spoke with pt's wife. She is aware of MW's recommendation. States that pt has dialysis today and they would like an appointment tomorrow with MW. Pt has been scheduled with MW on 02/09/17 at 10:15am. Nothing further was needed.

## 2017-02-08 NOTE — Telephone Encounter (Signed)
Needs ov or go on to ER as he is not easy to treat even in person, much less over the phone  If does come in bring all meds with him

## 2017-02-08 NOTE — Telephone Encounter (Signed)
Spoke with pt's wife. States that pt is still not feeling well. Reports increased SOB, chest tightness. He was diagnosed with PNA last week and was put on Zithromax. Last night, 911 was called due to pt's shortness of breath. Pt was not transported to the hospital. EMT's thought pt needed a stronger antibiotic.  MW - please advise. Thanks.

## 2017-02-09 ENCOUNTER — Encounter: Payer: Self-pay | Admitting: Internal Medicine

## 2017-02-09 ENCOUNTER — Ambulatory Visit (INDEPENDENT_AMBULATORY_CARE_PROVIDER_SITE_OTHER): Payer: Medicare PPO | Admitting: Internal Medicine

## 2017-02-09 ENCOUNTER — Ambulatory Visit (INDEPENDENT_AMBULATORY_CARE_PROVIDER_SITE_OTHER)
Admission: RE | Admit: 2017-02-09 | Discharge: 2017-02-09 | Disposition: A | Discharge status: Home / Self Care | Payer: Medicare PPO | Source: Ambulatory Visit | Attending: Internal Medicine | Admitting: Internal Medicine

## 2017-02-09 VITALS — BP 128/60 | HR 66 | Ht 68.0 in | Wt 152.0 lb

## 2017-02-09 DIAGNOSIS — J9611 Chronic respiratory failure with hypoxia: Secondary | ICD-10-CM | POA: Diagnosis not present

## 2017-02-09 DIAGNOSIS — R918 Other nonspecific abnormal finding of lung field: Secondary | ICD-10-CM

## 2017-02-09 DIAGNOSIS — J449 Chronic obstructive pulmonary disease, unspecified: Secondary | ICD-10-CM

## 2017-02-09 DIAGNOSIS — R0602 Shortness of breath: Secondary | ICD-10-CM | POA: Diagnosis not present

## 2017-02-09 NOTE — Progress Notes (Signed)
Subjective:    Patient ID: Terry [name], male    DOB: Nov 10, 1934    MRN: [id number]   Brief patient profile:  60 yowm quit smoking 2001 bothered by doe seemed better then worse in 2010 p "lung infection" and stayed on advair/spiriva  Combo since 2012 seemed better then admitted in [facility] with pna 10/09/12  in then transferred to [facility] NH x 3 weeks and discharged  On May 1st  2014 referred to pulmonary clinic 11/29/12  by [facility] for copd eval and proved to have GOLD III copd 01/26/13 with symptoms much better off ACEi.    History of Present Illness 08/27/2015  Extended post hosp /transition of care  f/u ov/Takia Runyon re: COPD GOLD III recurrent admits/ now chronic resp failure/HD dep CRI plus GOLD III rx symb/spriiva/neb saba Chief Complaint  Patient presents with  . Follow-up    Pt c/o occasional cough with white mucus. Pt denies wheeze/increased SOB/CP/tightness. Pt is on O2/2L, he states that on 3L it dries out his nose and burns. Pt also c/o low BP readings and wonders if some of his HTN meds need to be changed.   now sleeping in a recliner since admit 30-45 degrees due to sob/some noct cough but excess/ purulent sputum or mucus plugs   On prednisone with floor of   @ 10 mg since d/c 08/19/15  rec May need to consider adding Oracit to your regimen and I will le Dr Lorrene Reid know You will need to adjust your 02 to saturations of over 90%  Best fit and humidified 02 ordered  Plan A = Automatic = Symbicort 160 2 in am and 2 in pm with spiriva each am and prednisone 10 mg with breakfast daily  Work on inhaler technique:  Plan B = Backup Only use your albuterol (proventil) as a rescue medication Plan C = Crisis - only use your albuterol nebulizer if you first try Plan B and it fails to help > ok to use the nebulizer up to every 4 hours but if start needing it regularly call for immediate appointment    01/07/2016  f/u ov/Yexalen Deike re:  Copd GOLD III/ rx 5 prednisone / symb/spiriva and rare saba   (maybe once a week)  02 hs only  Chief Complaint  Patient presents with  . Follow-up    pt states breathing is baseline since last OV. c/o sob & non prod cough.  02 1lpm hs rarely during the day / c/o dry mouth ? From spiriva dpi Doe = MMRC3 = can't walk 100 yards even at a slow pace at a flat grade s stopping due to sob   rec Plan A = Automatic = symbicort 160 Take 2 puffs first thing in am and then another 2 puffs about 12 hours later and spiriva 1.25 x 4 or 2.5 x 2puffs in am only  Plan B = Backup Only use your albuterol (ventolin) as a rescue medication  Plan C = Crisis - only use your albuterol nebulizer if you first try Plan B and it fails to help > ok to use the nebulizer up to every 4 hours but if start needing it regularly call for immediate appointment Prednisone 5 mg daily   Please schedule a follow up visit in 3 months but call sooner if needed     09/29/16 NP rec Begin Tudorza 1 puff Twice daily , brush rinse and gargle after use. > bothered mouth  Finish Tamiflu and Cipro .  Taper  Prednisone as directed to 72m 1/2 daily .  Follow med calendar closely and bring to each visit.       12/10/2016  f/u ov/Fronie Holstein re: GOLD III copd symbicort 160 2bid / still on prednisone 5 mg daily - 02 hs only x 1lpm  Chief Complaint  Patient presents with  . Follow-up    Pt stopped the Todurza due to it making his mouth hurt, Pt states his breathing is doing well, he still slight cough but non productive, Denies wheezing,chest tightness   mb and back s stopping which is def better and no change off lama and off amb 02  rec Follow med calendar and bring to each ov    02/02/17 acute ov/[name] with worse breathing for sev days assoc with L cp rec pred taper zpak for L apex as dz    02/09/2017 acute extended ov/Emelynn Rance re: f/u pna/ worse sob on symb 160 2bid maint and pred 5 mg daily  Chief Complaint  Patient presents with  . Acute Visit    Pt states 2 nights ago he got up to use the  bathroom and then could not catch his breath- neb was taken and this helped.    L cp resolved p zpak but now doe x room to room not monitoring 02 with activity      No obvious day to day or daytime variability or assoc excess/ purulent sputum or mucus plugs or hemoptysis or ongoing cp or chest tightness, subjective wheeze or overt sinus or hb symptoms. No unusual exp hx or h/o childhood pna/ asthma or knowledge of premature birth.  Sleeping ok without nocturnal  or early am exacerbation  of respiratory  c/o's or need for noct saba. Also denies any obvious fluctuation of symptoms with weather or environmental changes or other aggravating or alleviating factors except as outlined above   Current Medications, Allergies, Complete Past Medical History, Past Surgical History, Family History, and Social History were reviewed in [employer].  ROS  The following are not active complaints unless bolded sore throat, dysphagia, dental problems, itching, sneezing,  nasal congestion or excess/ purulent secretions, ear ache,   fever, chills, sweats, unintended wt loss, classically pleuritic or exertional cp,  orthopnea pnd or leg swelling, presyncope, palpitations, abdominal pain, anorexia, nausea, vomiting, diarrhea  or change in bowel or bladder habits, change in stools or urine, dysuria,hematuria,  rash, arthralgias, visual complaints, headache, numbness, weakness or ataxia or problems with walking or coordination,  change in mood/affect or memory.                  Objective:   Physical Exam  Somber amb wm nad    02/26/2015          210 >  08/27/2015 163 p HD > 09/26/2015  159 > 11/26/2015  160 > 01/07/2016  159 > 04/07/2016  159 > 05/06/2016>   07/21/2016  157 > 08/13/2016  152  > 12/10/2016  150 > 02/09/2017  152     Vital signs reviewed   -  - Note on arrival 02 sats  95% on 2lpm      HEENT: nl dentition, and oropharynx. Nl external ear canals without cough reflex - mild   bilateral non-specific turbinate edema    NECK :  without JVD/Nodes/TM/ nl carotid upstrokes bilaterally   LUNGS: no acc muscle use,  Moderately Barrel chest, decreased BS in bases scatterer bilateral insp/exp rhonchi   CV:  RRR  no s3 or murmur or increase in P2, no edema   ABD:  soft and nontender with nl inspiratory excursion in the supine position. No bruits or organomegaly, bowel sounds nl  MS:  Nl gait/ ext warm without deformities, calf tenderness, cyanosis or clubbing No obvious joint restrictions   SKIN: warm and dry without lesions    NEURO:  alert, approp, nl sensorium with  no motor deficits       I personally reviewed images and agree with radiology impression as follows:  CXR:   02/09/2017  Persisten t density in the left upper lobe. While this could reflect persistent pneumonia, cannot exclude pulmonary nodule or mass. My impression:  Was not present 09/29/16 and somewhat wedged  shaped    Labs ordered 02/09/2017  =  Esr/ cbc with diff

## 2017-02-09 NOTE — Patient Instructions (Addendum)
Plan A = Automatic = symbicort 160 Take 2 puffs first thing in am/ chase with spiriva one capsule  and then another 2 puffs of symbicort  about 12 hours later.   Also automatically wear the 02 at bedtime then during the day as needed to  keep your saturations above 90% at all times     Plan B = Backup Only use your albuterol as a rescue medication to be used if you can't catch your breath by resting or doing a relaxed purse lip breathing pattern.  - The less you use it, the better it will work when you need it. - Ok to use the inhaler up to 2 puffs  every 4 hours if you must but call for appointment if use goes up over your usual need - Don't leave home without it !!  (think of it like the spare tire for your car)   Plan C = Crisis - only use your albuterol nebulizer if you first try Plan B and it fails to help > ok to use the nebulizer up to every 4 hours but if start needing it regularly call for immediate appointment   Please remember to go to the  x-ray department downstairs in the basement  for your tests - we will call you with the results when they are available.  Cancel follow up with Eric Form NP and keep appt to see me 03/09/17 - bring your updated med calendar with you  Add:  cipro 750 bid x 10 days and repeat cxr at next ov

## 2017-02-10 ENCOUNTER — Other Ambulatory Visit: Payer: Self-pay

## 2017-02-10 ENCOUNTER — Telehealth: Payer: Self-pay | Admitting: Internal Medicine

## 2017-02-10 DIAGNOSIS — N186 End stage renal disease: Secondary | ICD-10-CM | POA: Diagnosis not present

## 2017-02-10 DIAGNOSIS — R918 Other nonspecific abnormal finding of lung field: Secondary | ICD-10-CM

## 2017-02-10 DIAGNOSIS — D631 Anemia in chronic kidney disease: Secondary | ICD-10-CM | POA: Diagnosis not present

## 2017-02-10 DIAGNOSIS — J189 Pneumonia, unspecified organism: Secondary | ICD-10-CM

## 2017-02-10 DIAGNOSIS — N2581 Secondary hyperparathyroidism of renal origin: Secondary | ICD-10-CM | POA: Diagnosis not present

## 2017-02-10 DIAGNOSIS — Z992 Dependence on renal dialysis: Secondary | ICD-10-CM | POA: Diagnosis not present

## 2017-02-10 MED ORDER — TIOTROPIUM BROMIDE MONOHYDRATE 18 MCG IN CAPS: ORAL_CAPSULE | RESPIRATORY_TRACT | 11 refills | Status: DC

## 2017-02-10 MED ORDER — CIPROFLOXACIN HCL 750 MG PO TABS: 750.0000 mg | ORAL_TABLET | Freq: Two times a day (BID) | ORAL | 0 refills | Status: DC

## 2017-02-10 NOTE — Assessment & Plan Note (Signed)
New on 02/02/17 since 09/29/16 with transient L cp improved p zpak - 02/09/2017 rec esr, cbc with diff and cipro 750 bid x 10 days in view of h/o GNR pna's    PE entertained by Dr Vaughan Browner but cp resolved p zpak and he is HD pt with no leg symptoms so rx as pna and f/u in sept 2018 as planned with cxr/ call sooner if worse

## 2017-02-10 NOTE — Telephone Encounter (Signed)
Pt does not need to fast for labs- spoke with his spouse and notified her of this   Dr Melvyn Novas states that if pt is still having CP he needs to have CTA to r/o PE  LMTCB for the pt

## 2017-02-10 NOTE — Telephone Encounter (Signed)
Noted.  Will close encounter.  

## 2017-02-10 NOTE — Assessment & Plan Note (Signed)
-   Trial off ACEi 11/30/2012 >>> much better symptom control - 05/28/2014   Walked RA x one lap @ 185 stopped due to  [employer] / sats ok/ mod pace - PFT's 01/26/13      FEV1  1.28 (48%) ratio 48 and no better p B2,  DLCO 39 corrects to 48%  - PFTs 02/26/2015   FEV1 0.99  (35 % ) ratio 35  p 11 % improvement from saba with DLCO  24 % corrects to 35  % for alv volume  p am symb/spiriva    - Pred maint rx  Since d/c 08/14/15  - 09/26/2015  extensive coaching HFA effectiveness =    75%  - 11/26/2015 rec reduce pred to 5 mg daily   - 05/22/2016  After extensive coaching HFA effectiveness =  90%  - 07/21/2016 try off spiriva due to throat irritation > restarted at rec of wife during cough but did not help it so d/c'd again 08/13/2016 with plan to change to spriva smi if breathing worse  - 02/09/2017 added spiriva   Not following action plans at bottom of med calendar and now with worse sob so rec   Follow prednisone rx 20 mg max/ 5 mg min Add spiriva dpi back to rx   I had an extended discussion with the patient reviewing all relevant studies completed to date and  lasting 15 to 20 minutes of a 25 minute visit    Each maintenance medication was reviewed in detail including most importantly the difference between maintenance and prns and under what circumstances the prns are to be triggered using an action plan format that is not reflected in the computer generated alphabetically organized AVS.    Please see AVS for specific instructions unique to this visit that I personally wrote and verbalized to the the pt in detail and then reviewed with pt  by my nurse highlighting any  changes in therapy recommended at today's visit to their plan of care.

## 2017-02-10 NOTE — Telephone Encounter (Signed)
Patient's wife want to make Terry [name] aware that her husband is not having chest pain.

## 2017-02-10 NOTE — Progress Notes (Signed)
LMTCB

## 2017-02-10 NOTE — Assessment & Plan Note (Signed)
02 rx as of  hosp discharge 08/14/15   As of 02/09/2017 =  1lpm HS and prn daytime to keep sats > 90%

## 2017-02-11 ENCOUNTER — Other Ambulatory Visit (INDEPENDENT_AMBULATORY_CARE_PROVIDER_SITE_OTHER): Payer: Medicare PPO

## 2017-02-11 DIAGNOSIS — R918 Other nonspecific abnormal finding of lung field: Secondary | ICD-10-CM

## 2017-02-11 LAB — CBC WITH DIFFERENTIAL/PLATELET
BASOS ABS: 0.1 10*3/uL (ref 0.0–0.1)
BASOS PCT: 0.8 % (ref 0.0–3.0)
EOS ABS: 0.1 10*3/uL (ref 0.0–0.7)
Eosinophils Relative: 0.4 % (ref 0.0–5.0)
HEMATOCRIT: 32 % — AB (ref 39.0–52.0)
HEMOGLOBIN: 10.2 g/dL — AB (ref 13.0–17.0)
LYMPHS PCT: 7 % — AB (ref 12.0–46.0)
Lymphs Abs: 1 10*3/uL (ref 0.7–4.0)
MCHC: 31.8 g/dL (ref 30.0–36.0)
MCV: 99.8 fl (ref 78.0–100.0)
Monocytes Absolute: 0.9 10*3/uL (ref 0.1–1.0)
Monocytes Relative: 6.9 % (ref 3.0–12.0)
NEUTROS ABS: 11.7 10*3/uL — AB (ref 1.4–7.7)
Neutrophils Relative %: 84.9 % — ABNORMAL HIGH (ref 43.0–77.0)
PLATELETS: 246 10*3/uL (ref 150.0–400.0)
RBC: 3.21 Mil/uL — ABNORMAL LOW (ref 4.22–5.81)
RDW: 17.7 % — AB (ref 11.5–15.5)
WBC: 13.8 10*3/uL — AB (ref 4.0–10.5)

## 2017-02-11 LAB — SEDIMENTATION RATE: Sed Rate: 48 mm/hr — ABNORMAL HIGH (ref 0–20)

## 2017-02-12 DIAGNOSIS — D631 Anemia in chronic kidney disease: Secondary | ICD-10-CM | POA: Diagnosis not present

## 2017-02-12 DIAGNOSIS — Z992 Dependence on renal dialysis: Secondary | ICD-10-CM | POA: Diagnosis not present

## 2017-02-12 DIAGNOSIS — N2581 Secondary hyperparathyroidism of renal origin: Secondary | ICD-10-CM | POA: Diagnosis not present

## 2017-02-12 DIAGNOSIS — N186 End stage renal disease: Secondary | ICD-10-CM | POA: Diagnosis not present

## 2017-02-13 LAB — QUANTIFERON TB GOLD ASSAY (BLOOD)
Interferon Gamma Release Assay: NEGATIVE
Mitogen-Nil: >10 IU/mL
Quantiferon Nil Value: 0.06 IU/mL
Quantiferon Tb Ag Minus Nil Value: 0.05 IU/mL

## 2017-02-15 DIAGNOSIS — Z992 Dependence on renal dialysis: Secondary | ICD-10-CM | POA: Diagnosis not present

## 2017-02-15 DIAGNOSIS — N186 End stage renal disease: Secondary | ICD-10-CM | POA: Diagnosis not present

## 2017-02-15 DIAGNOSIS — N2581 Secondary hyperparathyroidism of renal origin: Secondary | ICD-10-CM | POA: Diagnosis not present

## 2017-02-15 DIAGNOSIS — D631 Anemia in chronic kidney disease: Secondary | ICD-10-CM | POA: Diagnosis not present

## 2017-02-15 NOTE — Progress Notes (Signed)
Spoke with pt and notified of results per Dr. Wert. Pt verbalized understanding and denied any questions. 

## 2017-02-17 DIAGNOSIS — Z992 Dependence on renal dialysis: Secondary | ICD-10-CM | POA: Diagnosis not present

## 2017-02-17 DIAGNOSIS — N186 End stage renal disease: Secondary | ICD-10-CM | POA: Diagnosis not present

## 2017-02-17 DIAGNOSIS — N2581 Secondary hyperparathyroidism of renal origin: Secondary | ICD-10-CM | POA: Diagnosis not present

## 2017-02-17 DIAGNOSIS — D631 Anemia in chronic kidney disease: Secondary | ICD-10-CM | POA: Diagnosis not present

## 2017-02-18 ENCOUNTER — Ambulatory Visit: Payer: Medicare PPO | Admitting: Acute Care

## 2017-02-18 DIAGNOSIS — Z992 Dependence on renal dialysis: Secondary | ICD-10-CM | POA: Diagnosis not present

## 2017-02-18 DIAGNOSIS — N186 End stage renal disease: Secondary | ICD-10-CM | POA: Diagnosis not present

## 2017-02-18 DIAGNOSIS — I871 Compression of vein: Secondary | ICD-10-CM | POA: Diagnosis not present

## 2017-02-18 DIAGNOSIS — T82858A Stenosis of vascular prosthetic devices, implants and grafts, initial encounter: Secondary | ICD-10-CM | POA: Diagnosis not present

## 2017-02-19 DIAGNOSIS — Z992 Dependence on renal dialysis: Secondary | ICD-10-CM | POA: Diagnosis not present

## 2017-02-19 DIAGNOSIS — D631 Anemia in chronic kidney disease: Secondary | ICD-10-CM | POA: Diagnosis not present

## 2017-02-19 DIAGNOSIS — E1122 Type 2 diabetes mellitus with diabetic chronic kidney disease: Secondary | ICD-10-CM | POA: Diagnosis not present

## 2017-02-19 DIAGNOSIS — N186 End stage renal disease: Secondary | ICD-10-CM | POA: Diagnosis not present

## 2017-02-19 DIAGNOSIS — N2581 Secondary hyperparathyroidism of renal origin: Secondary | ICD-10-CM | POA: Diagnosis not present

## 2017-02-22 DIAGNOSIS — Z992 Dependence on renal dialysis: Secondary | ICD-10-CM | POA: Diagnosis not present

## 2017-02-22 DIAGNOSIS — N186 End stage renal disease: Secondary | ICD-10-CM | POA: Diagnosis not present

## 2017-02-24 DIAGNOSIS — D631 Anemia in chronic kidney disease: Secondary | ICD-10-CM | POA: Diagnosis not present

## 2017-02-24 DIAGNOSIS — N2581 Secondary hyperparathyroidism of renal origin: Secondary | ICD-10-CM | POA: Diagnosis not present

## 2017-02-24 DIAGNOSIS — N186 End stage renal disease: Secondary | ICD-10-CM | POA: Diagnosis not present

## 2017-02-24 DIAGNOSIS — Z23 Encounter for immunization: Secondary | ICD-10-CM | POA: Diagnosis not present

## 2017-02-25 DIAGNOSIS — H43812 Vitreous degeneration, left eye: Secondary | ICD-10-CM | POA: Diagnosis not present

## 2017-02-25 DIAGNOSIS — Z961 Presence of intraocular lens: Secondary | ICD-10-CM | POA: Diagnosis not present

## 2017-02-26 DIAGNOSIS — D631 Anemia in chronic kidney disease: Secondary | ICD-10-CM | POA: Diagnosis not present

## 2017-02-26 DIAGNOSIS — Z23 Encounter for immunization: Secondary | ICD-10-CM | POA: Diagnosis not present

## 2017-02-26 DIAGNOSIS — N186 End stage renal disease: Secondary | ICD-10-CM | POA: Diagnosis not present

## 2017-02-26 DIAGNOSIS — N2581 Secondary hyperparathyroidism of renal origin: Secondary | ICD-10-CM | POA: Diagnosis not present

## 2017-03-01 DIAGNOSIS — D631 Anemia in chronic kidney disease: Secondary | ICD-10-CM | POA: Diagnosis not present

## 2017-03-01 DIAGNOSIS — N186 End stage renal disease: Secondary | ICD-10-CM | POA: Diagnosis not present

## 2017-03-01 DIAGNOSIS — Z23 Encounter for immunization: Secondary | ICD-10-CM | POA: Diagnosis not present

## 2017-03-01 DIAGNOSIS — N2581 Secondary hyperparathyroidism of renal origin: Secondary | ICD-10-CM | POA: Diagnosis not present

## 2017-03-03 DIAGNOSIS — N2581 Secondary hyperparathyroidism of renal origin: Secondary | ICD-10-CM | POA: Diagnosis not present

## 2017-03-03 DIAGNOSIS — N186 End stage renal disease: Secondary | ICD-10-CM | POA: Diagnosis not present

## 2017-03-03 DIAGNOSIS — D631 Anemia in chronic kidney disease: Secondary | ICD-10-CM | POA: Diagnosis not present

## 2017-03-03 DIAGNOSIS — Z23 Encounter for immunization: Secondary | ICD-10-CM | POA: Diagnosis not present

## 2017-03-04 DIAGNOSIS — J449 Chronic obstructive pulmonary disease, unspecified: Secondary | ICD-10-CM | POA: Diagnosis not present

## 2017-03-05 DIAGNOSIS — N2581 Secondary hyperparathyroidism of renal origin: Secondary | ICD-10-CM | POA: Diagnosis not present

## 2017-03-05 DIAGNOSIS — N186 End stage renal disease: Secondary | ICD-10-CM | POA: Diagnosis not present

## 2017-03-08 ENCOUNTER — Telehealth: Payer: Self-pay | Admitting: Internal Medicine

## 2017-03-08 DIAGNOSIS — N186 End stage renal disease: Secondary | ICD-10-CM | POA: Diagnosis not present

## 2017-03-08 DIAGNOSIS — N2581 Secondary hyperparathyroidism of renal origin: Secondary | ICD-10-CM | POA: Diagnosis not present

## 2017-03-08 NOTE — Telephone Encounter (Signed)
LMTCB for pt's spouse 

## 2017-03-08 NOTE — Telephone Encounter (Signed)
Spoke with patient's wife, she stated that he has been having increased SOB since 03/04/17. He also has a non productive cough. Denies any body aches or fever. She did stated that he had been outside a lot and was around a lot of ragweed.  She would like to know if MW could call in another round of Cipro.   Wishes to use [facility] in [place].   MW, please advise.   (At the time of call, patient and his wife were waiting to catch a flight back to [place]. Thayer Headings said it was ok to left a detailed message as her phone will be turned off.)

## 2017-03-08 NOTE — Telephone Encounter (Signed)
[phone] pt calling back

## 2017-03-08 NOTE — Telephone Encounter (Signed)
This is not appropriate for cipro and even if he did have an infection, it would not help him tonight  rec follow action plan from previous visit and if not improving go to ER - otherwise follow the instructions I wrote on 02/09/17 and keep appt for 03/09/17

## 2017-03-08 NOTE — Telephone Encounter (Signed)
ATC x1. Terry [name]'s VM was full. Will attempt to call back later.

## 2017-03-09 ENCOUNTER — Ambulatory Visit (INDEPENDENT_AMBULATORY_CARE_PROVIDER_SITE_OTHER): Payer: Medicare PPO | Admitting: Internal Medicine

## 2017-03-09 ENCOUNTER — Ambulatory Visit (INDEPENDENT_AMBULATORY_CARE_PROVIDER_SITE_OTHER)
Admission: RE | Admit: 2017-03-09 | Discharge: 2017-03-09 | Disposition: A | Discharge status: Home / Self Care | Payer: Medicare PPO | Source: Ambulatory Visit | Attending: Internal Medicine | Admitting: Internal Medicine

## 2017-03-09 ENCOUNTER — Encounter: Payer: Self-pay | Admitting: Internal Medicine

## 2017-03-09 VITALS — BP 130/60 | HR 71 | Ht 68.0 in | Wt 156.0 lb

## 2017-03-09 DIAGNOSIS — J9611 Chronic respiratory failure with hypoxia: Secondary | ICD-10-CM

## 2017-03-09 DIAGNOSIS — R918 Other nonspecific abnormal finding of lung field: Secondary | ICD-10-CM

## 2017-03-09 DIAGNOSIS — R05 Cough: Secondary | ICD-10-CM | POA: Diagnosis not present

## 2017-03-09 DIAGNOSIS — J449 Chronic obstructive pulmonary disease, unspecified: Secondary | ICD-10-CM

## 2017-03-09 DIAGNOSIS — I2510 Atherosclerotic heart disease of native coronary artery without angina pectoris: Secondary | ICD-10-CM | POA: Diagnosis not present

## 2017-03-09 NOTE — Patient Instructions (Addendum)
See calendar for specific medication instructions and bring it back for each and every office visit for every healthcare provider you see.  Without it,  you may not receive the best quality medical care that we feel you deserve.  You will note that the calendar groups together  your maintenance  medications that are timed at particular times of the day.  Think of this as your checklist for what your doctor has instructed you to do until your next evaluation to see what benefit  there is  to staying on a consistent group of medications intended to keep you well.  The other group at the bottom is entirely up to you to use as you see fit  for specific symptoms that may arise between visits that require you to treat them on an as needed basis.  Think of this as your action plan or "what if" list.   Separating the top medications from the bottom group is fundamental to providing you adequate care going forward.    When breathing is getting worse and needing your rescue inhaler more often than twice daily then double prednisone until better then back to one half daily   Adjust your 02 to keep your saturations above 90% at all times     See Tammy NP w/in 4 weeks with all your medications and your old medication calendar , even over the counter meds, separated in two separate bags, the ones you take no matter what vs the ones you stop once you feel better and take only as needed when you feel you need them.   Tammy  will generate for you a new user friendly medication calendar that will put [place] all on the same page re: your medication use.

## 2017-03-09 NOTE — Progress Notes (Signed)
Subjective:    Patient ID: Terry [name], male    DOB: Nov 10, 1934    MRN: [id number]   Brief patient profile:  60 yowm quit smoking 2001 bothered by doe seemed better then worse in 2010 p "lung infection" and stayed on advair/spiriva  Combo since 2012 seemed better then admitted in [facility] with pna 10/09/12  in then transferred to [facility] NH x 3 weeks and discharged  On May 1st  2014 referred to pulmonary clinic 11/29/12  by [facility] for copd eval and proved to have GOLD III copd 01/26/13 with symptoms much better off ACEi.    History of Present Illness 08/27/2015  Extended post hosp /transition of care  f/u ov/[name] re: COPD GOLD III recurrent admits/ now chronic resp failure/HD dep CRI plus GOLD III rx symb/spriiva/neb saba Chief Complaint  Patient presents with  . Follow-up    Pt c/o occasional cough with white mucus. Pt denies wheeze/increased SOB/CP/tightness. Pt is on O2/2L, he states that on 3L it dries out his nose and burns. Pt also c/o low BP readings and wonders if some of his HTN meds need to be changed.   now sleeping in a recliner since admit 30-45 degrees due to sob/some noct cough but excess/ purulent sputum or mucus plugs   On prednisone with floor of   @ 10 mg since d/c 08/19/15  rec May need to consider adding Oracit to your regimen and I will le Dr Lorrene Reid know You will need to adjust your 02 to saturations of over 90%  Best fit and humidified 02 ordered  Plan A = Automatic = Symbicort 160 2 in am and 2 in pm with spiriva each am and prednisone 10 mg with breakfast daily  Work on inhaler technique:  Plan B = Backup Only use your albuterol (proventil) as a rescue medication Plan C = Crisis - only use your albuterol nebulizer if you first try Plan B and it fails to help > ok to use the nebulizer up to every 4 hours but if start needing it regularly call for immediate appointment    01/07/2016  f/u ov/[name] re:  Copd GOLD III/ rx 5 prednisone / symb/spiriva and rare saba   (maybe once a week)  02 hs only  Chief Complaint  Patient presents with  . Follow-up    pt states breathing is baseline since last OV. c/o sob & non prod cough.  02 1lpm hs rarely during the day / c/o dry mouth ? From spiriva dpi Doe = MMRC3 = can't walk 100 yards even at a slow pace at a flat grade s stopping due to sob   rec Plan A = Automatic = symbicort 160 Take 2 puffs first thing in am and then another 2 puffs about 12 hours later and spiriva 1.25 x 4 or 2.5 x 2puffs in am only  Plan B = Backup Only use your albuterol (ventolin) as a rescue medication  Plan C = Crisis - only use your albuterol nebulizer if you first try Plan B and it fails to help > ok to use the nebulizer up to every 4 hours but if start needing it regularly call for immediate appointment Prednisone 5 mg daily   Please schedule a follow up visit in 3 months but call sooner if needed     09/29/16 NP rec Begin Tudorza 1 puff Twice daily , brush rinse and gargle after use. > bothered mouth  Finish Tamiflu and Cipro .  Taper  Prednisone as directed to 10mg  1/2 daily .  Follow med calendar closely and bring to each visit.       12/10/2016  f/u ov/[name] re: GOLD III copd symbicort 160 2bid / still on prednisone 5 mg daily - 02 hs only x 1lpm  Chief Complaint  Patient presents with  . Follow-up    Pt stopped the Todurza due to it making his mouth hurt, Pt states his breathing is doing well, he still slight cough but non productive, Denies wheezing,chest tightness   mb and back s stopping which is def better and no change off lama and off amb 02  rec Follow med calendar and bring to each ov    02/02/17 acute ov/[name] with worse breathing for sev days assoc with L cp rec pred taper zpak for L apex as dz    02/09/2017 acute extended ov/Nolton Denis re: f/u pna/ worse sob on symb 160 2bid maint and pred 5 mg daily  Chief Complaint  Patient presents with  . Acute Visit    Pt states 2 nights ago he got up to use the  bathroom and then could not catch his breath- neb was taken and this helped.    L cp resolved p zpak but now doe x room to room not monitoring 02 with activity  rec Plan A = Automatic = symbicort 160 Take 2 puffs first thing in am/ chase with spiriva one capsule  and then another 2 puffs of symbicort  about 12 hours later.  Also automatically wear the 02 at bedtime then during the day as needed to  keep your saturations above 90% at all times  Plan B = Backup Only use your albuterol as a rescue medication Plan C = Crisis - only use your albuterol nebulizer if you first try Plan B and it fails to help > ok to use the nebulizer up to every 4 hours but if start needing it regularly call for immediate appointment Please remember to go to the  x-ray department downstairs in the basement  for your tests - we will call you with the results when they are available. Cancel follow up with Eric Form NP and keep appt to see me 03/09/17 - bring your updated med calendar with you  Add:  cipro 750 bid x 10 days and repeat cxr at next ov      03/09/2017  f/u ov/[name] re:   LUL pna/ copd III/ 02 dep resp failure / new ex cp / did not bring med calendar, cannot recall action plans / main rx symb/spiriva/ pred at 5 mg daily  Chief Complaint  Patient presents with  . Follow-up    CXR repeated today. Pt c/o increased SOB and non prod cough x 5 days. He has CP that comes and goes "it's a muscle"- occurs with exertion and subsides with rest.    new pattern cp with exertion over the last month/ can't tell any difference on vs off 02 - discomfort is similar to prev angina but no longer uses ntg prn and not sure he even has it, no assoc pleuritic cp/ no nausea/vomiting/diaph and never at rest or noct - resolves in a few min at rest. Using 02 1lpm hs and prn daytime to keep 02  Acutely worse doe x 5 days with dry cough No worse before vs p HD and no assoc leg swelling  No obvious day to day or daytime variability or  assoc excess/ purulent sputum or mucus  plugs or hemoptysis   or chest tightness, subjective wheeze or overt sinus or hb symptoms. No unusual exp hx or h/o childhood pna/ asthma or knowledge of premature birth.  Sleeping ok flat without nocturnal  or early am exacerbation  of respiratory  c/o's or need for noct saba. Also denies any obvious fluctuation of symptoms with weather or environmental changes or other aggravating or alleviating factors except as outlined above   Current Allergies, Complete Past Medical History, Past Surgical History, Family History, and Social History were reviewed in [employer] record.  ROS  The following are not active complaints unless bolded sore throat, dysphagia, dental problems, itching, sneezing,  nasal congestion or disharge of excess mucus or purulent secretions, ear ache,   fever, chills, sweats, unintended wt loss or wt gain, classically pleuritic or exertional cp,  orthopnea pnd or leg swelling, presyncope, palpitations, abdominal pain, anorexia, nausea, vomiting, diarrhea  or change in bowel habits or bladder habits, change in stools or change in urine, dysuria, hematuria,  rash, arthralgias, visual complaints, headache, numbness, weakness or ataxia or problems with walking or coordination,  change in mood/affect or memory.        Current Meds  Medication Sig  . aspirin EC 325 MG tablet Take 1 tablet (325 mg total) by mouth daily.  [name] atorvastatin (LIPITOR) 20 MG tablet Take 1 tablet (20 mg total) by mouth daily.  . calcium acetate (PHOSLO) 667 MG capsule Take 667-1,334 mg by mouth 3 (three) times daily with meals.   . cholecalciferol (VITAMIN D) 1000 units tablet Take 1,000 Units by mouth daily.  [name] guaiFENesin (MUCINEX) 600 MG 12 hr tablet Take 600 mg by mouth 2 (two) times daily.  [name] ipratropium-albuterol (DUONEB) 0.5-2.5 (3) MG/3ML SOLN Take 3 mLs by nebulization every 4 (four) hours as needed (for breathing).   . methocarbamol  (ROBAXIN) 500 MG tablet Take 500 mg by mouth 2 (two) times daily as needed for muscle spasms.  . metoprolol succinate (TOPROL-XL) 50 MG 24 hr tablet Take 50 mg by mouth daily. Take with or immediately following a meal.  . midodrine (PROAMATINE) 10 MG tablet Take 10 mg by mouth as needed.  . multivitamin (RENA-VIT) TABS tablet Take 1 tablet by mouth daily.  . OXYGEN 1 lpm with sleep  . predniSONE (DELTASONE) 10 MG tablet Take 0.5 tablets (5 mg total) by mouth daily with breakfast. Take 1/2 tablet by mouth daily  . SYMBICORT 160-4.5 MCG/ACT inhaler INHALE 2 PUFFS TWICE DAILY  . tiotropium (SPIRIVA HANDIHALER) 18 MCG inhalation capsule One capsule each am                            Objective:   Physical Exam  Somber amb wm nad    02/26/2015          210 >  08/27/2015 163 p HD > 09/26/2015  159 > 11/26/2015  160 > 01/07/2016  159 > 04/07/2016  159 > 05/06/2016>   07/21/2016  157 > 08/13/2016  152  > 12/10/2016  150 > 02/09/2017  152 > 03/09/2017  156     Vital signs reviewed   -  - Note on arrival 02 sats 97% on 2lpm        HEENT: nl dentition, and oropharynx. Nl external ear canals without cough reflex - mild  bilateral non-specific turbinate edema    NECK :  without JVD/Nodes/TM/ nl carotid upstrokes bilaterally   LUNGS:  no acc muscle use,  Moderately Barrel chest, decreased BS  s localized or gen wheeze    CV:  RRR  no s3 or murmur or increase in P2, no edema   ABD:  soft and nontender with nl inspiratory excursion in the supine position. No bruits or organomegaly, bowel sounds nl  MS:  Nl gait/ ext warm without deformities, calf tenderness, cyanosis or clubbing No obvious joint restrictions   SKIN: warm and dry without lesions    NEURO:  alert, approp, nl sensorium with  no motor deficits          CXR PA and Lateral:   03/09/2017 :    I personally reviewed images and agree with radiology impression as follows:    Stable right basilar scarring. Aortic atherosclerosis. Left  upper lobe opacity noted on prior exam is significantly smaller, consistent with resolving inflammation with residual scarring present.

## 2017-03-10 ENCOUNTER — Telehealth: Payer: Self-pay | Admitting: Internal Medicine

## 2017-03-10 DIAGNOSIS — N2581 Secondary hyperparathyroidism of renal origin: Secondary | ICD-10-CM | POA: Diagnosis not present

## 2017-03-10 DIAGNOSIS — D631 Anemia in chronic kidney disease: Secondary | ICD-10-CM | POA: Diagnosis not present

## 2017-03-10 DIAGNOSIS — N186 End stage renal disease: Secondary | ICD-10-CM | POA: Diagnosis not present

## 2017-03-10 DIAGNOSIS — Z23 Encounter for immunization: Secondary | ICD-10-CM | POA: Diagnosis not present

## 2017-03-10 DIAGNOSIS — R0602 Shortness of breath: Secondary | ICD-10-CM

## 2017-03-10 NOTE — Telephone Encounter (Signed)
Spoke with patient's wife. She is aware of MW's recs. Will go ahead and order the labs and CT scan. She verbalized understanding.

## 2017-03-10 NOTE — Assessment & Plan Note (Signed)
-   Trial off ACEi 11/30/2012 >>> much better symptom control - 05/28/2014   Walked RA x one lap @ 185 stopped due to  [employer] / sats ok/ mod pace - PFT's 01/26/13      FEV1  1.28 (48%) ratio 48 and no better p B2,  DLCO 39 corrects to 48%  - PFTs 02/26/2015   FEV1 0.99  (35 % ) ratio 35  p 11 % improvement from saba with DLCO  24 % corrects to 35  % for alv volume  p am symb/spiriva    - Pred maint rx  Since d/c 08/14/15  - 09/26/2015  extensive coaching HFA effectiveness =    75%  - 11/26/2015 rec reduce pred to 5 mg daily   - 05/22/2016  After extensive coaching HFA effectiveness =  90%  - 07/21/2016 try off spiriva due to throat irritation > restarted at rec of wife during cough but did not help it so d/c'd again 08/13/2016 with plan to change to spriva smi if breathing worse  - 02/09/2017 added spiriva   03/09/2017  After extensive coaching HFA effectiveness =    75% (short Ti)   Mild flare and not following written action plan :   Increase pred to 10 mg per day until better, use saba up to q 4h prn   I had an extended discussion with the patient reviewing all relevant studies completed to date and  lasting 15 to 20 minutes of a 25 minute visit    Each maintenance medication was reviewed in detail including most importantly the difference between maintenance and prns and under what circumstances the prns are to be triggered using an action plan format that is not reflected in the computer generated alphabetically organized AVS but trather by a customized med calendar that reflects the AVS meds with confirmed 100% correlation.   In addition, Please see AVS for unique instructions that I personally wrote and verbalized to the the pt in detail and then reviewed with pt  by my nurse highlighting any  changes in therapy recommended at today's visit to their plan of care.

## 2017-03-10 NOTE — Assessment & Plan Note (Signed)
New on 02/02/17 since 09/29/16 with transient L cp improved p zpak - 02/09/2017 rec esr, cbc with diff and cipro 750 bid x 10 days in view of h/o GNR pna's  - Quant Gold TB  02/11/17  Neg/  ESR  48  - cxr 03/09/2017 much better, no further abx indicated

## 2017-03-10 NOTE — Telephone Encounter (Signed)
Change 02 to 2lpm as a default (he never goes lower) and if won't adjust it up with activity by monitoring sats, which is what I suggested yesterday, then increase to 4lpm with activity  Cbc with diff, bmet, esr for dyspnea on exertion and CTa chest also

## 2017-03-10 NOTE — Telephone Encounter (Signed)
Called and spoke to pt's wife, Thayer Headings. Pt c/o mild intermittent increase in SOB and prod cough - unsure of mucus color x 1 day. Pt was just seen by MW on 03/09/17 with CXR. Pt refused another appt. Thayer Headings states the pt had a dyspneic episode when using the bathroom without O2 and took several minutes for pt to return to baseline with O2 back on. Advised Thayer Headings this was likely due to pt being without O2, she denied this being the cause of his SOB. Shirline Frees since pt was just seen on 03/09/17 that we could keep an eye on pt's s/s and to call back if things were to worsen or not improve, she refused this and requested message be sent to MW.   Dr. Melvyn Novas please advise. Thanks.  Patient Instructions   See calendar for specific medication instructions and bring it back for each and every office visit for every healthcare provider you see.  Without it,  you may not receive the best quality medical care that we feel you deserve.   You will note that the calendar groups together  your maintenance  medications that are timed at particular times of the day.  Think of this as your checklist for what your doctor has instructed you to do until your next evaluation to see what benefit  there is  to staying on a consistent group of medications intended to keep you well.  The other group at the bottom is entirely up to you to use as you see fit  for specific symptoms that may arise between visits that require you to treat them on an as needed basis.  Think of this as your action plan or "what if" list.    Separating the top medications from the bottom group is fundamental to providing you adequate care going forward.     When breathing is getting worse and needing your rescue inhaler more often than twice daily then double prednisone until better then back to one half daily    Adjust your 02 to keep your saturations above 90% at all times        See Tammy NP w/in 4 weeks with all your medications and your old  medication calendar , even over the counter meds, separated in two separate bags, the ones you take no matter what vs the ones you stop once you feel better and take only as needed when you feel you need them.   Tammy  will generate for you a new user friendly medication calendar that will put [place] all on the same page re: your medication use.

## 2017-03-10 NOTE — Assessment & Plan Note (Signed)
Reporting new ex cp x one month, advised contact cards, renew ntg to take prn, and make sure he keeps sats > 90% at all times

## 2017-03-10 NOTE — Assessment & Plan Note (Signed)
02 rx as of  hosp discharge 08/14/15   As of 03/09/2017 =  1lpm HS and prn daytime to keep sats > 90%    Not monitoring sats with activity as I rec, this may be why has new ex cp > advised again to adjust as needed for level of activity

## 2017-03-11 ENCOUNTER — Encounter: Payer: Self-pay | Admitting: Family Medicine

## 2017-03-11 ENCOUNTER — Telehealth: Payer: Self-pay | Admitting: Internal Medicine

## 2017-03-11 ENCOUNTER — Ambulatory Visit (INDEPENDENT_AMBULATORY_CARE_PROVIDER_SITE_OTHER)
Admission: RE | Admit: 2017-03-11 | Discharge: 2017-03-11 | Disposition: A | Discharge status: Home / Self Care | Payer: Medicare PPO | Source: Ambulatory Visit | Attending: Internal Medicine | Admitting: Internal Medicine

## 2017-03-11 ENCOUNTER — Other Ambulatory Visit (INDEPENDENT_AMBULATORY_CARE_PROVIDER_SITE_OTHER): Payer: Medicare PPO

## 2017-03-11 DIAGNOSIS — R0602 Shortness of breath: Secondary | ICD-10-CM

## 2017-03-11 LAB — CBC WITH DIFFERENTIAL/PLATELET
BASOS PCT: 0.4 % (ref 0.0–3.0)
Basophils Absolute: 0 10*3/uL (ref 0.0–0.1)
EOS ABS: 0 10*3/uL (ref 0.0–0.7)
Eosinophils Relative: 0.4 % (ref 0.0–5.0)
HCT: 28.4 % — ABNORMAL LOW (ref 39.0–52.0)
HEMOGLOBIN: 9.3 g/dL — AB (ref 13.0–17.0)
LYMPHS ABS: 0.6 10*3/uL — AB (ref 0.7–4.0)
Lymphocytes Relative: 6.1 % — ABNORMAL LOW (ref 12.0–46.0)
MCHC: 32.8 g/dL (ref 30.0–36.0)
MCV: 99.2 fl (ref 78.0–100.0)
MONO ABS: 0.7 10*3/uL (ref 0.1–1.0)
Monocytes Relative: 6.9 % (ref 3.0–12.0)
Neutro Abs: 8.5 10*3/uL — ABNORMAL HIGH (ref 1.4–7.7)
Platelets: 203 10*3/uL (ref 150.0–400.0)
RBC: 2.86 Mil/uL — ABNORMAL LOW (ref 4.22–5.81)
RDW: 19.1 % — AB (ref 11.5–15.5)
WBC: 9.9 10*3/uL (ref 4.0–10.5)

## 2017-03-11 LAB — SEDIMENTATION RATE: SED RATE: 50 mm/h — AB (ref 0–20)

## 2017-03-11 MED ORDER — IOPAMIDOL (ISOVUE-370) INJECTION 76%
80.0000 mL | Freq: Once | INTRAVENOUS | Status: AC | PRN
  Administered 2017-03-11: 80 mL via INTRAVENOUS

## 2017-03-11 NOTE — Telephone Encounter (Signed)
ATC pt's wife, no answer. Left message for pt to call back.

## 2017-03-11 NOTE — Telephone Encounter (Signed)
Spoke with pt's wife. States that she spoke with someone earlier about this matter. Pt saw MW on 03/09/17. Message will be closed.

## 2017-03-11 NOTE — Progress Notes (Signed)
LMTCB

## 2017-03-12 ENCOUNTER — Inpatient Hospital Stay: Admission: RE | Admit: 2017-03-12 | Payer: Medicare PPO | Source: Ambulatory Visit

## 2017-03-12 DIAGNOSIS — N186 End stage renal disease: Secondary | ICD-10-CM | POA: Diagnosis not present

## 2017-03-12 DIAGNOSIS — N2581 Secondary hyperparathyroidism of renal origin: Secondary | ICD-10-CM | POA: Diagnosis not present

## 2017-03-12 DIAGNOSIS — D631 Anemia in chronic kidney disease: Secondary | ICD-10-CM | POA: Diagnosis not present

## 2017-03-12 DIAGNOSIS — Z23 Encounter for immunization: Secondary | ICD-10-CM | POA: Diagnosis not present

## 2017-03-12 NOTE — Progress Notes (Signed)
Pt aware of results- see 9/21 phone note

## 2017-03-12 NOTE — Telephone Encounter (Signed)
Called and spoke with pts wife and she is happy that the results were all good.  She has informed the pt and nothing further is needed.

## 2017-03-15 ENCOUNTER — Inpatient Hospital Stay (HOSPITAL_COMMUNITY)
Admission: EM | Admit: 2017-03-15 | Discharge: 2017-03-18 | DRG: 193 | Disposition: A | Discharge status: Home / Self Care | Payer: Medicare PPO | Attending: Family Medicine | Admitting: Family Medicine

## 2017-03-15 ENCOUNTER — Encounter (HOSPITAL_COMMUNITY): Payer: Self-pay | Admitting: Emergency Medicine

## 2017-03-15 ENCOUNTER — Emergency Department (HOSPITAL_COMMUNITY): Payer: Medicare PPO

## 2017-03-15 ENCOUNTER — Other Ambulatory Visit (HOSPITAL_COMMUNITY): Payer: Medicare PPO

## 2017-03-15 DIAGNOSIS — E7800 Pure hypercholesterolemia, unspecified: Secondary | ICD-10-CM | POA: Diagnosis present

## 2017-03-15 DIAGNOSIS — J439 Emphysema, unspecified: Secondary | ICD-10-CM | POA: Diagnosis present

## 2017-03-15 DIAGNOSIS — Z8249 Family history of ischemic heart disease and other diseases of the circulatory system: Secondary | ICD-10-CM

## 2017-03-15 DIAGNOSIS — N2581 Secondary hyperparathyroidism of renal origin: Secondary | ICD-10-CM | POA: Diagnosis present

## 2017-03-15 DIAGNOSIS — Y95 Nosocomial condition: Secondary | ICD-10-CM | POA: Diagnosis present

## 2017-03-15 DIAGNOSIS — J9620 Acute and chronic respiratory failure, unspecified whether with hypoxia or hypercapnia: Secondary | ICD-10-CM | POA: Diagnosis present

## 2017-03-15 DIAGNOSIS — R079 Chest pain, unspecified: Secondary | ICD-10-CM | POA: Diagnosis present

## 2017-03-15 DIAGNOSIS — J449 Chronic obstructive pulmonary disease, unspecified: Secondary | ICD-10-CM

## 2017-03-15 DIAGNOSIS — R0602 Shortness of breath: Secondary | ICD-10-CM | POA: Diagnosis not present

## 2017-03-15 DIAGNOSIS — I480 Paroxysmal atrial fibrillation: Secondary | ICD-10-CM | POA: Diagnosis present

## 2017-03-15 DIAGNOSIS — Z87891 Personal history of nicotine dependence: Secondary | ICD-10-CM | POA: Diagnosis not present

## 2017-03-15 DIAGNOSIS — N186 End stage renal disease: Secondary | ICD-10-CM | POA: Diagnosis present

## 2017-03-15 DIAGNOSIS — I248 Other forms of acute ischemic heart disease: Secondary | ICD-10-CM | POA: Diagnosis present

## 2017-03-15 DIAGNOSIS — J189 Pneumonia, unspecified organism: Principal | ICD-10-CM | POA: Diagnosis present

## 2017-03-15 DIAGNOSIS — I10 Essential (primary) hypertension: Secondary | ICD-10-CM

## 2017-03-15 DIAGNOSIS — Z888 Allergy status to other drugs, medicaments and biological substances status: Secondary | ICD-10-CM

## 2017-03-15 DIAGNOSIS — J9611 Chronic respiratory failure with hypoxia: Secondary | ICD-10-CM

## 2017-03-15 DIAGNOSIS — R05 Cough: Secondary | ICD-10-CM | POA: Diagnosis not present

## 2017-03-15 DIAGNOSIS — M1990 Unspecified osteoarthritis, unspecified site: Secondary | ICD-10-CM | POA: Diagnosis present

## 2017-03-15 DIAGNOSIS — I509 Heart failure, unspecified: Secondary | ICD-10-CM | POA: Diagnosis present

## 2017-03-15 DIAGNOSIS — E785 Hyperlipidemia, unspecified: Secondary | ICD-10-CM | POA: Diagnosis present

## 2017-03-15 DIAGNOSIS — I120 Hypertensive chronic kidney disease with stage 5 chronic kidney disease or end stage renal disease: Secondary | ICD-10-CM | POA: Diagnosis not present

## 2017-03-15 DIAGNOSIS — Z7982 Long term (current) use of aspirin: Secondary | ICD-10-CM

## 2017-03-15 DIAGNOSIS — Z955 Presence of coronary angioplasty implant and graft: Secondary | ICD-10-CM | POA: Diagnosis not present

## 2017-03-15 DIAGNOSIS — I132 Hypertensive heart and chronic kidney disease with heart failure and with stage 5 chronic kidney disease, or end stage renal disease: Secondary | ICD-10-CM | POA: Diagnosis present

## 2017-03-15 DIAGNOSIS — Z8673 Personal history of transient ischemic attack (TIA), and cerebral infarction without residual deficits: Secondary | ICD-10-CM | POA: Diagnosis not present

## 2017-03-15 DIAGNOSIS — Z820 Family history of epilepsy and other diseases of the nervous system: Secondary | ICD-10-CM | POA: Diagnosis not present

## 2017-03-15 DIAGNOSIS — H04123 Dry eye syndrome of bilateral lacrimal glands: Secondary | ICD-10-CM | POA: Diagnosis present

## 2017-03-15 DIAGNOSIS — Z7952 Long term (current) use of systemic steroids: Secondary | ICD-10-CM

## 2017-03-15 DIAGNOSIS — Z992 Dependence on renal dialysis: Secondary | ICD-10-CM | POA: Diagnosis not present

## 2017-03-15 DIAGNOSIS — I2510 Atherosclerotic heart disease of native coronary artery without angina pectoris: Secondary | ICD-10-CM | POA: Diagnosis present

## 2017-03-15 DIAGNOSIS — Z803 Family history of malignant neoplasm of breast: Secondary | ICD-10-CM

## 2017-03-15 DIAGNOSIS — I350 Nonrheumatic aortic (valve) stenosis: Secondary | ICD-10-CM | POA: Diagnosis not present

## 2017-03-15 DIAGNOSIS — I482 Chronic atrial fibrillation: Secondary | ICD-10-CM | POA: Diagnosis present

## 2017-03-15 DIAGNOSIS — D638 Anemia in other chronic diseases classified elsewhere: Secondary | ICD-10-CM | POA: Diagnosis present

## 2017-03-15 DIAGNOSIS — J9621 Acute and chronic respiratory failure with hypoxia: Secondary | ICD-10-CM | POA: Diagnosis present

## 2017-03-15 DIAGNOSIS — Z885 Allergy status to narcotic agent status: Secondary | ICD-10-CM

## 2017-03-15 DIAGNOSIS — Z881 Allergy status to other antibiotic agents status: Secondary | ICD-10-CM

## 2017-03-15 HISTORY — DX: Personal history of other medical treatment: Z92.89

## 2017-03-15 HISTORY — DX: Dependence on renal dialysis: Z99.2

## 2017-03-15 HISTORY — DX: Dependence on supplemental oxygen: Z99.81

## 2017-03-15 HISTORY — DX: End stage renal disease: N18.6

## 2017-03-15 HISTORY — DX: Heart failure, unspecified: I50.9

## 2017-03-15 HISTORY — DX: Anxiety disorder, unspecified: F41.9

## 2017-03-15 HISTORY — DX: Unspecified osteoarthritis, unspecified site: M19.90

## 2017-03-15 HISTORY — DX: Pneumonia, unspecified organism: J18.9

## 2017-03-15 LAB — TROPONIN I
TROPONIN I: 0.54 ng/mL — AB (ref <0.03)
TROPONIN I: 0.54 ng/mL — AB (ref <0.03)

## 2017-03-15 LAB — CBC
HCT: 27.4 % — ABNORMAL LOW (ref 39.0–52.0)
HEMOGLOBIN: 8.7 g/dL — AB (ref 13.0–17.0)
MCH: 31.6 pg (ref 26.0–34.0)
MCHC: 31.8 g/dL (ref 30.0–36.0)
MCV: 99.6 fL (ref 78.0–100.0)
Platelets: 224 10*3/uL (ref 150–400)
RBC: 2.75 MIL/uL — ABNORMAL LOW (ref 4.22–5.81)
RDW: 18.3 % — ABNORMAL HIGH (ref 11.5–15.5)
WBC: 12.8 10*3/uL — ABNORMAL HIGH (ref 4.0–10.5)

## 2017-03-15 LAB — BASIC METABOLIC PANEL
ANION GAP: 15 (ref 5–15)
BUN: 65 mg/dL — ABNORMAL HIGH (ref 6–20)
CALCIUM: 8.6 mg/dL — AB (ref 8.9–10.3)
CO2: 26 mmol/L (ref 22–32)
Chloride: 96 mmol/L — ABNORMAL LOW (ref 101–111)
Creatinine, Ser: 6.55 mg/dL — ABNORMAL HIGH (ref 0.61–1.24)
GFR calc Af Amer: 8 mL/min — ABNORMAL LOW (ref >60)
GFR calc non Af Amer: 7 mL/min — ABNORMAL LOW (ref >60)
GLUCOSE: 107 mg/dL — AB (ref 65–99)
Potassium: 4.5 mmol/L (ref 3.5–5.1)
Sodium: 137 mmol/L (ref 135–145)

## 2017-03-15 LAB — FERRITIN: Ferritin: 1075 ng/mL — ABNORMAL HIGH (ref 24–336)

## 2017-03-15 LAB — I-STAT TROPONIN, ED
TROPONIN I, POC: 0.56 ng/mL — AB (ref 0.00–0.08)
Troponin i, poc: 0.53 ng/mL (ref 0.00–0.08)

## 2017-03-15 LAB — IRON AND TIBC
Iron: 39 ug/dL — ABNORMAL LOW (ref 45–182)
Saturation Ratios: 17 % — ABNORMAL LOW (ref 17.9–39.5)
TIBC: 230 ug/dL — ABNORMAL LOW (ref 250–450)
UIBC: 191 ug/dL

## 2017-03-15 LAB — PROCALCITONIN: Procalcitonin: 0.25 ng/mL

## 2017-03-15 LAB — STREP PNEUMONIAE URINARY ANTIGEN: STREP PNEUMO URINARY ANTIGEN: NEGATIVE

## 2017-03-15 MED ORDER — DARBEPOETIN ALFA 100 MCG/0.5ML IJ SOSY
PREFILLED_SYRINGE | INTRAMUSCULAR | Status: AC
  Administered 2017-03-15: 100 ug via INTRAVENOUS
  Filled 2017-03-15: qty 0.5

## 2017-03-15 MED ORDER — VANCOMYCIN HCL 10 G IV SOLR
1500.0000 mg | Freq: Once | INTRAVENOUS | Status: AC
  Administered 2017-03-15: 1500 mg via INTRAVENOUS
  Filled 2017-03-15: qty 1500

## 2017-03-15 MED ORDER — PREDNISONE 5 MG PO TABS
5.0000 mg | ORAL_TABLET | Freq: Every day | ORAL | Status: DC
  Administered 2017-03-16 – 2017-03-18 (×3): 5 mg via ORAL
  Filled 2017-03-15 (×3): qty 1

## 2017-03-15 MED ORDER — ALBUTEROL SULFATE (2.5 MG/3ML) 0.083% IN NEBU: 5.0000 mg | INHALATION_SOLUTION | Freq: Once | RESPIRATORY_TRACT | Status: DC

## 2017-03-15 MED ORDER — SODIUM CHLORIDE 0.9 % IV SOLN
500.0000 mg | Freq: Once | INTRAVENOUS | Status: AC
  Administered 2017-03-15: 500 mg via INTRAVENOUS
  Filled 2017-03-15: qty 500

## 2017-03-15 MED ORDER — VITAMIN D 1000 UNITS PO TABS
1000.0000 [IU] | ORAL_TABLET | Freq: Every day | ORAL | Status: DC
  Administered 2017-03-16 – 2017-03-18 (×3): 1000 [IU] via ORAL
  Filled 2017-03-15 (×3): qty 1

## 2017-03-15 MED ORDER — ALBUTEROL SULFATE (2.5 MG/3ML) 0.083% IN NEBU
2.5000 mg | INHALATION_SOLUTION | Freq: Four times a day (QID) | RESPIRATORY_TRACT | Status: DC
  Administered 2017-03-16 (×2): 2.5 mg via RESPIRATORY_TRACT
  Filled 2017-03-15 (×2): qty 3

## 2017-03-15 MED ORDER — ATORVASTATIN CALCIUM 20 MG PO TABS
20.0000 mg | ORAL_TABLET | Freq: Every day | ORAL | Status: DC
  Administered 2017-03-16 – 2017-03-18 (×3): 20 mg via ORAL
  Filled 2017-03-15 (×3): qty 1

## 2017-03-15 MED ORDER — METHOCARBAMOL 500 MG PO TABS: 500.0000 mg | ORAL_TABLET | Freq: Two times a day (BID) | ORAL | Status: DC | PRN

## 2017-03-15 MED ORDER — HEPARIN SODIUM (PORCINE) 5000 UNIT/ML IJ SOLN
5000.0000 [IU] | Freq: Three times a day (TID) | INTRAMUSCULAR | Status: DC
  Administered 2017-03-15 – 2017-03-18 (×8): 5000 [IU] via SUBCUTANEOUS
  Filled 2017-03-15 (×6): qty 1

## 2017-03-15 MED ORDER — IPRATROPIUM-ALBUTEROL 0.5-2.5 (3) MG/3ML IN SOLN
3.0000 mL | RESPIRATORY_TRACT | Status: DC | PRN
  Administered 2017-03-15: 3 mL via RESPIRATORY_TRACT
  Filled 2017-03-15: qty 3

## 2017-03-15 MED ORDER — DEXTROSE 5 % IV SOLN: 1.0000 g | Freq: Three times a day (TID) | INTRAVENOUS | Status: DC

## 2017-03-15 MED ORDER — SODIUM CHLORIDE 0.9 % IV SOLN: 100.0000 mL | INTRAVENOUS | Status: DC | PRN

## 2017-03-15 MED ORDER — TIOTROPIUM BROMIDE MONOHYDRATE 18 MCG IN CAPS
18.0000 ug | ORAL_CAPSULE | Freq: Every day | RESPIRATORY_TRACT | Status: DC
  Administered 2017-03-16 – 2017-03-18 (×3): 18 ug via RESPIRATORY_TRACT
  Filled 2017-03-15 (×2): qty 5

## 2017-03-15 MED ORDER — DARBEPOETIN ALFA 60 MCG/0.3ML IJ SOSY: 60.0000 ug | PREFILLED_SYRINGE | INTRAMUSCULAR | Status: DC

## 2017-03-15 MED ORDER — DARBEPOETIN ALFA 100 MCG/0.5ML IJ SOSY
100.0000 ug | PREFILLED_SYRINGE | INTRAMUSCULAR | Status: DC
  Administered 2017-03-15: 100 ug via INTRAVENOUS
  Filled 2017-03-15: qty 0.5

## 2017-03-15 MED ORDER — LIDOCAINE-PRILOCAINE 2.5-2.5 % EX CREA: 1.0000 "application " | TOPICAL_CREAM | CUTANEOUS | Status: DC | PRN

## 2017-03-15 MED ORDER — CALCIUM ACETATE (PHOS BINDER) 667 MG PO CAPS
667.0000 mg | ORAL_CAPSULE | Freq: Three times a day (TID) | ORAL | Status: DC
  Administered 2017-03-16 – 2017-03-18 (×6): 667 mg via ORAL
  Filled 2017-03-15 (×6): qty 1

## 2017-03-15 MED ORDER — VANCOMYCIN HCL IN DEXTROSE 750-5 MG/150ML-% IV SOLN
750.0000 mg | INTRAVENOUS | Status: DC
  Administered 2017-03-17: 750 mg via INTRAVENOUS
  Filled 2017-03-15: qty 150

## 2017-03-15 MED ORDER — DOXERCALCIFEROL 4 MCG/2ML IV SOLN
3.0000 ug | INTRAVENOUS | Status: DC
  Administered 2017-03-15 – 2017-03-17 (×3): 3 ug via INTRAVENOUS
  Filled 2017-03-15 (×2): qty 2

## 2017-03-15 MED ORDER — CEFEPIME HCL 2 G IJ SOLR
2.0000 g | INTRAMUSCULAR | Status: DC
  Administered 2017-03-17: 2 g via INTRAVENOUS
  Filled 2017-03-15: qty 2

## 2017-03-15 MED ORDER — ASPIRIN EC 325 MG PO TBEC
325.0000 mg | DELAYED_RELEASE_TABLET | Freq: Every day | ORAL | Status: DC
  Administered 2017-03-16 – 2017-03-18 (×3): 325 mg via ORAL
  Filled 2017-03-15 (×3): qty 1

## 2017-03-15 MED ORDER — DOXERCALCIFEROL 4 MCG/2ML IV SOLN
INTRAVENOUS | Status: AC
  Administered 2017-03-15: 3 ug via INTRAVENOUS
  Filled 2017-03-15: qty 2

## 2017-03-15 MED ORDER — MOMETASONE FURO-FORMOTEROL FUM 200-5 MCG/ACT IN AERO
2.0000 | INHALATION_SPRAY | Freq: Two times a day (BID) | RESPIRATORY_TRACT | Status: DC
  Administered 2017-03-16 – 2017-03-18 (×5): 2 via RESPIRATORY_TRACT
  Filled 2017-03-15 (×2): qty 8.8

## 2017-03-15 MED ORDER — DEXTROSE 5 % IV SOLN
2.0000 g | Freq: Once | INTRAVENOUS | Status: AC
  Administered 2017-03-15: 2 g via INTRAVENOUS
  Filled 2017-03-15: qty 2

## 2017-03-15 MED ORDER — MIDODRINE HCL 5 MG PO TABS
ORAL_TABLET | ORAL | Status: AC
  Filled 2017-03-15: qty 2

## 2017-03-15 MED ORDER — METOPROLOL SUCCINATE ER 50 MG PO TB24
50.0000 mg | ORAL_TABLET | Freq: Every day | ORAL | Status: DC
  Administered 2017-03-15 – 2017-03-18 (×3): 50 mg via ORAL
  Filled 2017-03-15 (×3): qty 1

## 2017-03-15 MED ORDER — ALTEPLASE 2 MG IJ SOLR: 2.0000 mg | Freq: Once | INTRAMUSCULAR | Status: DC | PRN

## 2017-03-15 MED ORDER — RENA-VITE PO TABS
1.0000 | ORAL_TABLET | Freq: Every day | ORAL | Status: DC
  Administered 2017-03-15 – 2017-03-16 (×2): 1 via ORAL
  Filled 2017-03-15 (×2): qty 1

## 2017-03-15 MED ORDER — HEPARIN SODIUM (PORCINE) 1000 UNIT/ML DIALYSIS: 20.0000 [IU]/kg | INTRAMUSCULAR | Status: DC | PRN

## 2017-03-15 MED ORDER — MIDODRINE HCL 5 MG PO TABS
10.0000 mg | ORAL_TABLET | ORAL | Status: DC
  Administered 2017-03-15 – 2017-03-17 (×2): 10 mg via ORAL

## 2017-03-15 MED ORDER — SODIUM CHLORIDE 0.9 % IV SOLN
62.5000 mg | INTRAVENOUS | Status: DC
  Filled 2017-03-15: qty 5

## 2017-03-15 MED ORDER — DEXTROSE 5 % IV SOLN: 2.0000 g | INTRAVENOUS | Status: DC

## 2017-03-15 MED ORDER — LIDOCAINE HCL (PF) 1 % IJ SOLN: 5.0000 mL | INTRAMUSCULAR | Status: DC | PRN

## 2017-03-15 MED ORDER — PENTAFLUOROPROP-TETRAFLUOROETH EX AERO: 1.0000 "application " | INHALATION_SPRAY | CUTANEOUS | Status: DC | PRN

## 2017-03-15 MED ORDER — DEXTROSE 5 % IV SOLN
2.0000 g | INTRAVENOUS | Status: DC
  Filled 2017-03-15: qty 2

## 2017-03-15 MED ORDER — NEPRO/CARBSTEADY PO LIQD
237.0000 mL | Freq: Two times a day (BID) | ORAL | Status: DC
  Filled 2017-03-15 (×8): qty 237

## 2017-03-15 MED ORDER — ALBUTEROL SULFATE (2.5 MG/3ML) 0.083% IN NEBU
5.0000 mg | INHALATION_SOLUTION | Freq: Once | RESPIRATORY_TRACT | Status: AC
  Administered 2017-03-15: 5 mg via RESPIRATORY_TRACT
  Filled 2017-03-15: qty 6

## 2017-03-15 MED ORDER — IPRATROPIUM BROMIDE 0.02 % IN SOLN
0.5000 mg | Freq: Once | RESPIRATORY_TRACT | Status: AC
  Administered 2017-03-15: 0.5 mg via RESPIRATORY_TRACT
  Filled 2017-03-15: qty 2.5

## 2017-03-15 MED ORDER — HEPARIN SODIUM (PORCINE) 1000 UNIT/ML DIALYSIS: 1000.0000 [IU] | INTRAMUSCULAR | Status: DC | PRN

## 2017-03-15 NOTE — H&P (Signed)
History and Physical    Terry [name]. KZL:[id number] DOB: 03-Dec-1934 DOA: 03/15/2017   PCP: [name], DO   Attending physician: Marily Memos  Patient coming from/Resides with: Private residence/wife  Chief Complaint: Shortness of breath  HPI: Terry [name]. is a 82 y.o. male with medical history significant for PAF not on anticoagulation, CKD on dialysis (MWF), remote history of CAD with 2 stents in 1999, COPD on nocturnal oxygen, hypertension, dyslipidemia. Patient has been following with his pulmonologist regularly since an April admit for HCAP. Last evaluated by pulmonology on 9/18 where his prednisone dosage as well as his SABA dosages were temporarily increased. At that visit he was encouraged to utilize his oxygen nocturnally for O2 sats less than or equal to 90%. He was also reporting chest discomfort that the pulmonologist felt may be related to his underlying coronary disease and referred him back to cardiology. In further discussion with the patient he does report that he has exertional chest pain and decreased exercise tolerance. In regards to his current presenting symptoms, he awakened around 345 am this morning with shortness of breath despite utilizing oxygen as prescribed. He presented to the ER where he was found to have a focal right-sided infiltrate concerning for pneumonia. He had leukocytosis of 12,800 but differential was not obtained. He also has elevated poc troponin at 0.53 and 0.56. Blood cultures have been obtained in the ER and empiric broad-spectrum antibiotics initiated.  ED Course:  Vital Signs: BP (!) 146/58   Pulse 76   Temp 97.9 F (36.6 C) (Oral)   Resp (!) 23   SpO2 96%  2 view CXR: Developing right-sided airspace disease and small bilateral pleural effusions that is either pneumonia or asymmetric pulmonary edema Lab data: Sodium 137, potassium 4.5, chloride 96, CO2 26, glucose 107, BUN 65, creatinine 6.55, anion gap 15, point-of-care troponin 0.56  and 0.53, white count 12,800 differential not obtained, hemoglobin 8.7, platelets 224,000, blood cultures have been obtained Medications and treatments: Proventil neb 1, Maxipime 2 g IV 1, vancomycin 1500 mg IV 1  Review of Systems:  In addition to the HPI above,  No Fever-chills, myalgias or other constitutional symptoms No Headache, changes with Vision or hearing, new weakness, tingling, numbness in any extremity, dizziness, dysarthria or word finding difficulty, gait disturbance or imbalance, tremors or seizure activity No problems swallowing food or Liquids, indigestion/reflux, choking or coughing while eating, abdominal pain with or after eating No palpitations, orthopnea  No Abdominal pain, N/V, melena,hematochezia, dark tarry stools, constipation No dysuria, malodorous urine, hematuria or flank pain No new skin rashes, lesions, masses or bruises, No new joint pains, aches, swelling or redness No recent unintentional weight loss No polyuria, polydypsia or polyphagia   Past Medical History:  Diagnosis Date  . Acute and chronic respiratory failure 10/22/2012  . Atrial fibrillation ([place])   . CAD (coronary artery disease) [place], [place]  . COPD (chronic obstructive pulmonary disease) ([place])   . Flu 09/24/2016   influenza b  . High cholesterol   . Hypertension   . Kidney disease    CKD4, sees [place] [facility], considering dialysis  . Osteoarthritis 10/22/2012  . Pneumonia April 2014  . Shortness of breath dyspnea   . Stroke [facility]) 2012    Past Surgical History:  Procedure Laterality Date  . APPENDECTOMY  1984  . AV FISTULA PLACEMENT Right 05/21/2015   Procedure: Right Arm Brachiocephalic ARTERIOVENOUS (AV) FISTULA CREATION;  Surgeon: Angelia Mould, MD;  Location: [facility] OR;  Service: Vascular;  Laterality: Right;  . COLON SURGERY  2012   partial colon removed - twisted bowel  . CORONARY STENT PLACEMENT  1999   2 stents  . CYSTOSCOPY/RETROGRADE/URETEROSCOPY  Bilateral 04/23/2014   Procedure: CYSTOSCOPY BILATERAL RETROGRADE,  LEFT URETEROSCOPY, LEFT STENT PLACEMENT;  Surgeon: Raynelle Bring, MD;  Location: [facility] ORS;  Service: Urology;  Laterality: Bilateral;  . HERNIA REPAIR     Right inguinal    Social History   Social History  . Marital status: Married    Spouse name: N/A  . Number of children: 3  . Years of education: N/A   Occupational History  . retired-worked in radio Retired   Social History Main Topics  . Smoking status: Former Smoker    Packs/day: 1.00    Years: 49.00    Types: Cigarettes, Pipe, Cigars    Quit date: 02/21/2000  . Smokeless tobacco: Never Used  . Alcohol use No  . Drug use: No  . Sexual activity: Not on file   Other Topics Concern  . Not on file   Social History Narrative   Work or School: retired [employer] Situation: lives with wife in [place] regular; poor diet             Mobility: Independent Work history: Retired   Allergies  Allergen Reactions  . Yellow Dyes (Non-Tartrazine) Other (See Comments)    Burns arms Cough medications (tussionex and benzonatate ok)  . Levaquin [Levofloxacin In D5w] Itching  . Tussionex Pennkinetic Er [Hydrocod Polst-Cpm Polst Er] Other (See Comments)    Unknown on MAR    Family History  Problem Relation Age of Onset  . Cancer Mother        Breast  . Heart disease Father   . Heart attack Father   . Parkinson's disease Brother      Prior to Admission medications   Medication Sig Start Date End Date Taking? Authorizing Provider  aspirin EC 325 MG tablet Take 1 tablet (325 mg total) by mouth daily. 09/26/15   Burnell Blanks, MD  atorvastatin (LIPITOR) 20 MG tablet Take 1 tablet (20 mg total) by mouth daily. 09/21/16   Burnell Blanks, MD  calcium acetate (PHOSLO) 667 MG capsule Take 667-1,334 mg by mouth 3 (three) times daily with meals.  01/09/16   [provider]   cholecalciferol (VITAMIN D) 1000 units tablet Take 1,000 Units by mouth daily.    [provider]  guaiFENesin (MUCINEX) 600 MG 12 hr tablet Take 600 mg by mouth 2 (two) times daily.    [provider]  ipratropium-albuterol (DUONEB) 0.5-2.5 (3) MG/3ML SOLN Take 3 mLs by nebulization every 4 (four) hours as needed (for breathing).     [provider]  methocarbamol (ROBAXIN) 500 MG tablet Take 500 mg by mouth 2 (two) times daily as needed for muscle spasms.    [provider]  metoprolol succinate (TOPROL-XL) 50 MG 24 hr tablet Take 50 mg by mouth daily. Take with or immediately following a meal.    [provider]  midodrine (PROAMATINE) 10 MG tablet Take 10 mg by mouth as needed.    [provider]  multivitamin (RENA-VIT) TABS tablet Take 1 tablet by mouth daily.    [provider]  OXYGEN 1 lpm with sleep    [provider]  predniSONE (  DELTASONE) 10 MG tablet Take 0.5 tablets (5 mg total) by mouth daily with breakfast. Take 1/2 tablet by mouth daily 10/22/16   Tanda Rockers, MD  SYMBICORT 160-4.5 MCG/ACT inhaler INHALE 2 PUFFS TWICE DAILY 11/30/16   Tanda Rockers, MD  tiotropium [employer] HANDIHALER) 18 MCG inhalation capsule One capsule each am 02/10/17   Tanda Rockers, MD    Physical Exam: Vitals:   03/15/17 0845 03/15/17 0900 03/15/17 0940 03/15/17 1000  BP: (!) 150/53 (!) 144/52 (!) 149/57 (!) 146/58  Pulse: 74 72 75 76  Resp: 19 (!) 21 17 (!) 23  Temp:      TempSrc:      SpO2: 98% 97% 97% 96%      Constitutional: NAD, calm, comfortable Eyes: PERRL, lids and conjunctivae normal ENMT: Mucous membranes are moist. Posterior pharynx clear of any exudate or lesions.Normal dentition.  Neck: normal, supple, no masses, no thyromegaly Respiratory: Diffuse bilateral crackles, Normal respiratory effort without accessory muscle use at rest. 4 L Cardiovascular: Regular rate and rhythm, no murmurs / rubs / gallops.  No extremity edema. 2+ pedal pulses. No carotid bruits.  Abdomen: no tenderness, no masses palpated. No hepatosplenomegaly. Bowel sounds positive.  Musculoskeletal: no clubbing / cyanosis. No joint deformity upper and lower extremities. Good ROM, no contractures. Normal muscle tone.  Skin: no rashes, lesions, ulcers. No induration Neurologic: CN 2-12 grossly intact. Sensation intact, DTR normal. Strength 5/5 x all 4 extremities.  Psychiatric: Normal judgment and insight. Alert and oriented x 3. Normal mood.    Labs on Admission: I have personally reviewed following labs and imaging studies  CBC:  Recent Labs Lab 03/11/17 1052 03/15/17 0714  WBC 9.9 12.8*  NEUTROABS 8.5*  --   HGB 9.3* 8.7*  HCT 28.4* 27.4*  MCV 99.2 99.6  PLT 203.0 767   Basic Metabolic Panel:  Recent Labs Lab 03/15/17 0714  NA 137  K 4.5  CL 96*  CO2 26  GLUCOSE 107*  BUN 65*  CREATININE 6.55*  CALCIUM 8.6*   GFR: Estimated Creatinine Clearance: 8.8 mL/min (A) (by C-G formula based on SCr of 6.55 mg/dL (H)). Liver Function Tests: No results for input(s): AST, ALT, ALKPHOS, BILITOT, PROT, ALBUMIN in the last 168 hours. No results for input(s): LIPASE, AMYLASE in the last 168 hours. No results for input(s): AMMONIA in the last 168 hours. Coagulation Profile: No results for input(s): INR, PROTIME in the last 168 hours. Cardiac Enzymes: No results for input(s): CKTOTAL, CKMB, CKMBINDEX, TROPONINI in the last 168 hours. BNP (last 3 results) No results for input(s): PROBNP in the last 8760 hours. HbA1C: No results for input(s): HGBA1C in the last 72 hours. CBG: No results for input(s): GLUCAP in the last 168 hours. Lipid Profile: No results for input(s): CHOL, HDL, LDLCALC, TRIG, CHOLHDL, LDLDIRECT in the last 72 hours. Thyroid Function Tests: No results for input(s): TSH, T4TOTAL, FREET4, T3FREE, THYROIDAB in the last 72 hours. Anemia Panel: No results for input(s): VITAMINB12, FOLATE, FERRITIN,  TIBC, IRON, RETICCTPCT in the last 72 hours. Urine analysis:    Component Value Date/Time   COLORURINE AMBER (A) 08/16/2015 0655   APPEARANCEUR HAZY (A) 08/16/2015 0655   LABSPEC 1.023 08/16/2015 0655   PHURINE 5.0 08/16/2015 0655   GLUCOSEU 100 (A) 08/16/2015 0655   HGBUR NEGATIVE 08/16/2015 0655   BILIRUBINUR SMALL (A) 08/16/2015 0655   KETONESUR 15 (A) 08/16/2015 0655   PROTEINUR >300 (A) 08/16/2015 0655   UROBILINOGEN 0.2 04/16/2014 1135   NITRITE  NEGATIVE 08/16/2015 0655   LEUKOCYTESUR SMALL (A) 08/16/2015 0655   Sepsis Labs: @LABRCNTIP (procalcitonin:4,lacticidven:4) )No results found for this or any previous visit (from the past 240 hour(s)).   Radiological Exams on Admission: Dg Chest 2 View  Result Date: 03/15/2017 CLINICAL DATA:  Cough and shortness of breath.  Weakness. EXAM: CHEST  2 VIEW COMPARISON:  03/11/2017 CT.  Plain film 03/09/2017 FINDINGS: Hyperinflation. Midline trachea. Borderline cardiomegaly. Atherosclerosis in the transverse aorta. Developing small bilateral pleural effusions, greater on the right. No pneumothorax. Patchy right infrahilar and lower lobe airspace disease is new or progressive. Pulmonary interstitial prominence remains. IMPRESSION: Since 03/09/2017, developing right-sided airspace disease and small bilateral pleural effusions. Favor pneumonia. Asymmetric pulmonary edema felt less likely. Borderline cardiomegaly, with transverse aortic atherosclerosis. Electronically Signed   By: Abigail Miyamoto M.D.   On: 03/15/2017 07:51    EKG: (Independently reviewed) Sinus rhythm with ventricular rate 70 bpm, QTC 4 and 46 ms, flattening of ST segment in lead 1, scooping of ST segment in lead 2 with T-wave inversion in lead 3. Low voltage R waves in V2 and V3 with prominent T waves in V2, V3 and V4. The inferior lead changes are stable and compared to  Assessment/Plan Principal Problem:   HCAP (healthcare-associated pneumonia) -Initial Chest x-ray with focal  infiltrate concerning for pneumonia -Patient has associated nonproductive but wet sounding cough and leukocytosis -Continue Maxipime/vancomycin -Follow up on blood cultures; obtain sputum culture; if able to urinate obtain urinary strep -Procalcitonin -Follow up chest x-ray in a.m.-infiltrate resolved after dialysis likely noninfectious in etiology  Active Problems:   COPD (chronic obstructive pulmonary disease) GOLD stage III -Continue preadmission nebulizers as well as chronic low-dose prednisone and LABA  -Not actively wheezing and does not appear to be experiencing COPD exacerbation     Chronic hypoxemic respiratory failure  -Typically utilizes 1 L oxygen at HS and prn during the daytime if needed  -No definitive hypoxemia documented but currently on 4 L oxygen in the ER  -Attempt room-air ambulatory pulse oximetry to confirm hypoxemia     Hypertension -Current blood pressure control  -Continue Toprol  -No ACE inhibitor secondary to cough     Chest pain -Patient reports has been experiencing exertional chest pain for at least 2-3 weeks  -Has history of 2 stents placed for a 95% blockage in 1999 (RCA)-[facility] -Continue beta blocker, statin and aspirin -Current troponins mildly elevated -continue to cycle     CKD (chronic kidney disease) stage V requiring chronic dialysis  -MWF schedule -Nephrology consulted -Patient reports that typical dry weight 152 pounds and most recent weight 156 pounds-?? Respiratory symptoms secondary to volume overload    High cholesterol -Continue statin     PAF (paroxysmal atrial fibrillation)  -Previously deemed not a candidate for anticoagulation  -Currently maintaining sinus rhythm       DVT prophylaxis: subcutaneous heparin   Code Status:  full Family Communication: wife   Disposition Plan: home  Consults called: Nephrology/[place]; Cardiology/[facility]    Jerimie Mancuso L. ANP-BC [facility] Pager [phone]   If 7PM-7AM, please contact night-coverage [email or web address] Password [email or web address]  03/15/2017, 10:14 AM

## 2017-03-15 NOTE — ED Notes (Signed)
Pt caught taking home meds against RN and MD approval. Pt encouraged not to take medications without getting approval from the MD due to the patient's state. Verbalized understanding   Pt also trying to eat at this time. PA did not approve to eat at this time. Pt made aware and asked to wait until the hospitalist approved eating.

## 2017-03-15 NOTE — Consult Note (Signed)
[facility] Renal Consultation Note    Indication for Consultation:  Management of ESRD/hemodialysis; anemia, hypertension/volume and secondary hyperparathyroidism PCP: Colin Benton, Alfonso Patten, DO  HPI: Terry [name]. is a 82 y.o. male who lives in [place] with ESRD on MWF dialysis almost two years at [facility] with a history of COPD/emphysema, afib off coumadin, recurrent PNA and influenza A April 2018 who presented to the ED with worsening cough, SOB, DOE, inability to lie flat.  His wife said his symptoms started when he was in [place] for a wedding (he flew)  and progressivly worsened.  He saw Dr. Melvyn Novas last week and had a CXR neg for PNA and then CT angio was negative for PE with nodular densities over right lung thought to be inflammatory vs infectious.  At that time, Dr. Melvyn Novas also increased his SABA doses short term.  He was also to be referred back for cardiology evaluation.  He attends dialysis regular and completes his entire treatments. His IDWG are < 2 L and he gets to or slightly below his EDW.  He has been afebrile his last two treatments.  He sometimes does standing BP.  His last standing BP was 9/19 with BP 149/92 sitting and 153/100 standing.  He denies N, V, D or LE edema,. Cough is dry. He states appetite is good but wife states it is intermittent.  He is due for dialysis today. He has been using his home O2 more than usual and couldn't get his breath at 2 L today.  He uses midodrine prn at dialysis to keep BP up.  Evaluation in the ED this am showed elevated WBC to 12.8, right sided infiltrate with small bilateral pleural effusions on CXR, trop 0.053 and 0.58, persistent SOB unable to recline. He was afebrile with some ^ respirations, BP increase somewhat for him. hgb down to 8.7 , K 4.5  Past Medical History:  Diagnosis Date  . Acute and chronic respiratory failure 10/22/2012  . Atrial fibrillation ([place])   . CAD (coronary artery disease) [place], [place]  .  COPD (chronic obstructive pulmonary disease) ([place])   . Flu 09/24/2016   influenza b  . High cholesterol   . Hypertension   . Kidney disease    CKD4, sees [place] [facility], considering dialysis  . Osteoarthritis 10/22/2012  . Pneumonia April 2014  . Shortness of breath dyspnea   . Stroke [facility]) 2012   Past Surgical History:  Procedure Laterality Date  . APPENDECTOMY  1984  . AV FISTULA PLACEMENT Right 05/21/2015   Procedure: Right Arm Brachiocephalic ARTERIOVENOUS (AV) FISTULA CREATION;  Surgeon: Angelia Mould, MD;  Location: [place];  Service: Vascular;  Laterality: Right;  . COLON SURGERY  2012   partial colon removed - twisted bowel  . CORONARY STENT PLACEMENT  1999   2 stents  . CYSTOSCOPY/RETROGRADE/URETEROSCOPY Bilateral 04/23/2014   Procedure: CYSTOSCOPY BILATERAL RETROGRADE,  LEFT URETEROSCOPY, LEFT STENT PLACEMENT;  Surgeon: Raynelle Bring, MD;  Location: [facility];  Service: Urology;  Laterality: Bilateral;  . HERNIA REPAIR     Right inguinal   Family History  Problem Relation Age of Onset  . Cancer Mother        Breast  . Heart disease Father   . Heart attack Father   . Parkinson's disease Brother    Social History:  reports that he quit smoking about 17 years ago. His smoking use included Cigarettes, Pipe, and Cigars. He has a 49.00 pack-year smoking  history. He has never used smokeless tobacco. He reports that he does not drink alcohol or use drugs. Allergies  Allergen Reactions  . Yellow Dyes (Non-Tartrazine) Other (See Comments)    Burns arms Cough medications (tussionex and benzonatate ok)  . Levaquin [Levofloxacin In D5w] Itching  . Tussionex Pennkinetic Er [Hydrocod Polst-Cpm Polst Er] Other (See Comments)    Unknown on MAR   Prior to Admission medications   Medication Sig Start Date End Date Taking? Authorizing Provider  aspirin EC 325 MG tablet Take 1 tablet (325 mg total) by mouth daily. 09/26/15   Burnell Blanks, MD  atorvastatin (LIPITOR) 20  MG tablet Take 1 tablet (20 mg total) by mouth daily. 09/21/16   Burnell Blanks, MD  calcium acetate (PHOSLO) 667 MG capsule Take 667-1,334 mg by mouth 3 (three) times daily with meals.  01/09/16   [provider]  cholecalciferol (VITAMIN D) 1000 units tablet Take 1,000 Units by mouth daily.    [provider]  guaiFENesin (MUCINEX) 600 MG 12 hr tablet Take 600 mg by mouth 2 (two) times daily.    [provider]  ipratropium-albuterol (DUONEB) 0.5-2.5 (3) MG/3ML SOLN Take 3 mLs by nebulization every 4 (four) hours as needed (for breathing).     [provider]  methocarbamol (ROBAXIN) 500 MG tablet Take 500 mg by mouth 2 (two) times daily as needed for muscle spasms.    [provider]  metoprolol succinate (TOPROL-XL) 50 MG 24 hr tablet Take 50 mg by mouth daily. Take with or immediately following a meal.    [provider]  midodrine (PROAMATINE) 10 MG tablet Take 10 mg by mouth as needed.    [provider]  multivitamin (RENA-VIT) TABS tablet Take 1 tablet by mouth daily.    [provider]  OXYGEN 1 lpm with sleep    [provider]  predniSONE (DELTASONE) 10 MG tablet Take 0.5 tablets (5 mg total) by mouth daily with breakfast. Take 1/2 tablet by mouth daily 10/22/16   Tanda Rockers, MD  SYMBICORT 160-4.5 MCG/ACT inhaler INHALE 2 PUFFS TWICE DAILY 11/30/16   Tanda Rockers, MD  tiotropium [facility] HANDIHALER) 18 MCG inhalation capsule One capsule each am 02/10/17   Tanda Rockers, MD   Current Facility-Administered Medications  Medication Dose Route Frequency Provider Last Rate Last Dose  . [START ON 03/17/2017] ceFEPIme (MAXIPIME) 2 g in dextrose 5 % 50 mL IVPB  2 g Intravenous Q M,W,F-1800 Carlota Raspberry, Tiffany, PA-C      . Darbepoetin Alfa (ARANESP) injection 100 mcg  100 mcg Intravenous Q Mon-HD Alric Seton, PA-C      . doxercalciferol (HECTOROL) injection 3 mcg  3 mcg Intravenous Q M,W,F-HD Alric Seton, PA-C      . [START ON 03/17/2017] ferric gluconate (NULECIT) 62.5 mg in sodium chloride 0.9 % 100 mL IVPB  62.5 mg Intravenous Q Wed-HD Alric Seton, PA-C      . vancomycin (VANCOCIN) 1,500 mg in sodium chloride 0.9 % 500 mL IVPB  1,500 mg Intravenous Once Rumbarger, Rachel L, RPH 250 mL/hr at 03/15/17 1015 1,500 mg at 03/15/17 1015  . [START ON 03/17/2017] vancomycin (VANCOCIN) IVPB 750 mg/150 ml premix  750 mg Intravenous Q M,W,F-HD Rumbarger, Valeda Malm, [facility]       Current Outpatient Prescriptions  Medication Sig Dispense Refill  . aspirin EC 325 MG tablet Take 1 tablet (325 mg total) by mouth daily. 30 tablet 0  . atorvastatin (LIPITOR) 20  MG tablet Take 1 tablet (20 mg total) by mouth daily. 90 tablet 2  . calcium acetate (PHOSLO) 667 MG capsule Take 667-1,334 mg by mouth 3 (three) times daily with meals.     . cholecalciferol (VITAMIN D) 1000 units tablet Take 1,000 Units by mouth daily.    [name] guaiFENesin (MUCINEX) 600 MG 12 hr tablet Take 600 mg by mouth 2 (two) times daily.    [name] ipratropium-albuterol (DUONEB) 0.5-2.5 (3) MG/3ML SOLN Take 3 mLs by nebulization every 4 (four) hours as needed (for breathing).     . methocarbamol (ROBAXIN) 500 MG tablet Take 500 mg by mouth 2 (two) times daily as needed for muscle spasms.    . metoprolol succinate (TOPROL-XL) 50 MG 24 hr tablet Take 50 mg by mouth daily. Take with or immediately following a meal.    . midodrine (PROAMATINE) 10 MG tablet Take 10 mg by mouth as needed.    . multivitamin (RENA-VIT) TABS tablet Take 1 tablet by mouth daily.    . OXYGEN 1 lpm with sleep    . predniSONE (DELTASONE) 10 MG tablet Take 0.5 tablets (5 mg total) by mouth daily with breakfast. Take 1/2 tablet by mouth daily 30 tablet 2  . SYMBICORT 160-4.5 MCG/ACT inhaler INHALE 2 PUFFS TWICE DAILY 3 Inhaler 3  . tiotropium (SPIRIVA HANDIHALER) 18 MCG inhalation capsule One capsule each am 30 capsule 11   Labs: Basic Metabolic Panel:  Recent Labs Lab  03/15/17 0714  NA 137  K 4.5  CL 96*  CO2 26  GLUCOSE 107*  BUN 65*  CREATININE 6.55*  CALCIUM 8.6*   Lab Results  Component Value Date   INR 0.97 12/28/2016   INR 1.05 07/28/2015   INR 1.11 06/30/2015   CBC:  Recent Labs Lab 03/11/17 1052 03/15/17 0714  WBC 9.9 12.8*  NEUTROABS 8.5*  --   HGB 9.3* 8.7*  HCT 28.4* 27.4*  MCV 99.2 99.6  PLT 203.0 224   Studies/Results: Dg Chest 2 View  Result Date: 03/15/2017 CLINICAL DATA:  Cough and shortness of breath.  Weakness. EXAM: CHEST  2 VIEW COMPARISON:  03/11/2017 CT.  Plain film 03/09/2017 FINDINGS: Hyperinflation. Midline trachea. Borderline cardiomegaly. Atherosclerosis in the transverse aorta. Developing small bilateral pleural effusions, greater on the right. No pneumothorax. Patchy right infrahilar and lower lobe airspace disease is new or progressive. Pulmonary interstitial prominence remains. IMPRESSION: Since 03/09/2017, developing right-sided airspace disease and small bilateral pleural effusions. Favor pneumonia. Asymmetric pulmonary edema felt less likely. Borderline cardiomegaly, with transverse aortic atherosclerosis. Electronically Signed   By: Abigail Miyamoto M.D.   On: 03/15/2017 07:51    ROS: As per HPI otherwise negative.  Physical Exam: Vitals:   03/15/17 0940 03/15/17 1000 03/15/17 1015 03/15/17 1030  BP: (!) 149/57 (!) 146/58 (!) 153/56 (!) 148/60  Pulse: 75 76 73 72  Resp: 17 (!) 23 (!) 21 19  Temp:      TempSrc:      SpO2: 97% 96% 96% 96%     General: elderly WM breathing easily but wanting to sit up straight  Head: NCAT sclera not icteric MMM Neck: Supple. Neck veins full Lungs:  Dim BS throughout very poor expansion, diffuse crackles Heart: RRR with S1 S2.  Abdomen: soft NT + BS Lower extremities:without significant edema  Neuro: A & O  X 3. Moves all extremities spontaneously. Psych:  Responds to questions appropriately - a little irritable Dialysis Access:right upper AVF with large vein  extending into upper arm/chest +  bruit  Dialysis Orders: [facility] MWF 4 hr 400/800 2K 2 Ca profile 4 left upper AVF heparin 2500 venofer 50 q Wed Hectorol 3 Mircera 60 - last got 50 on 9/12 and prior to that 100 in August Recent labs: hgb dropping - was 10.1 8/22 8.9 9/19 12%  iPTH 302 Ca/P ok hgb A1c 5.8   Assessment/Plan: 1. RLL HCAP in patinet with severe COPD with some degree of fluid - overload - s/p culture and empiric antibiotics; plan aggressive UF today with HD; plan given midodrine with HD preemptively to help keep BP up 2. ESRD -  MWF - HD today -lower volume 3. Hypertension/volume  - MTP XL + midodrine for BP support prn - will give today as we want to decrease volume 4. Anemia  -hgb trending down -? Related to ESA deficit - had been on higher dose Mircera in August 100 or more - tsat in August 12% had Fe replettion - plan recheck Fe stores, if still low, needs hemocult; for now ^ Aransp to 100 be given today and continue weekly Fe 5. Metabolic bone disease -  Continue ESA/binders 6. Nutrition - needs renal carb mod/vitamin 7. PAF-off coumadin since 07/2015- on MTP 8. CAD - hx stents in 1999 - consider cardiology consult/cyling enzymes  Myriam Jacobson, PA-C [place] [phone] 03/15/2017, 11:17 AM   I have seen and examined this patient and agree with plan and assessment in the above note with renal recommendations/intervention highlighted.  Feeling a little better for now but does admit to having SSCP with ambulation prior to admission which is new for him.  R/o MI and follow.  Broadus John A Malaak Stach,MD 03/15/2017 1:31 PM

## 2017-03-15 NOTE — ED Provider Notes (Signed)
[facility] DEPT Provider Note   CSN: [id number] Arrival date & time: 03/15/17  1856     History   Chief Complaint Chief Complaint  Patient presents with  . Shortness of Breath    HPI Terry [name]. is a 82 y.o. male.  HPI  Patient has a significant PMH consistent with hx of chronic respiratory failure on at home oxygen. Paroxysmal atrial fibrillation (325 aspirin: GI blood loss and severe bruising on Coumadin this was dc'd) , HTN, HLD, CVA, GOLD III COPD, CAD (hx of stents), ESRD on HD (MWF) Dr. Lorrene Reid.   He last saw Dr. Melvyn Novas (pulm) in the office March 09, 2017 for follow-up of LUL PNA/COPD III/ oxygen dep resp failure, he is on Symbicort and Spiriva as well as prednisone 5 mg daily. Has been treated for pneumonia, appeared to be doing better. -- zpack, cipro 750 mg BID x 10 days. Repeat xray 9/18 much improved. No further abx indicated  Last saw Dr. [name] (cards) February 2018 for follow-up, no chest pain. Overall he was feeling well. No dizziness, near syncope or syncope, ne LE edema: ASA, BB, statin, BB, cant do anticoagulants.  He comes to the ER today for acute shortness of breath that started at 3:45 am this morning awakening him from sleep. He is on 1L O2 via [place] while asleep but is requiring more and needing it during the day. He went out of town on a plane on Saturday and came back yesterday. He said he didn't have any chest pains but his wife said he was having some pains. He had been increasingly worsening over the past few days.      Past Medical History:  Diagnosis Date  . Acute and chronic respiratory failure 10/22/2012  . Atrial fibrillation ([place])   . CAD (coronary artery disease) [place], [place]  . COPD (chronic obstructive pulmonary disease) ([place])   . Flu 09/24/2016   influenza b  . High cholesterol   . Hypertension   . Kidney disease    CKD4, sees [place] [facility], considering dialysis  . Osteoarthritis 10/22/2012  . Pneumonia April 2014    . Shortness of breath dyspnea   . Stroke [facility]) 2012    Patient Active Problem List   Diagnosis Date Noted  . Pulmonary infiltrates on CXR 02/10/2017  . Upper airway cough syndrome 08/13/2016  . Memory loss of unknown cause 04/08/2016  . Chronic respiratory failure with hypoxia ([place]) 08/27/2015  . ESRD (end stage renal disease) on dialysis ([place]) 07/28/2015  . HCAP (healthcare-associated pneumonia)   . Pneumothorax, left   . Chronic diastolic heart failure ([place])   . Anemia of chronic disease   . Pneumonia due to Enterobacter cloacae ([place])   . Pneumonia due to Klebsiella pneumoniae ([place])   . Chronic atrial fibrillation ([place])   . CAD (coronary artery disease) 02/04/2015  . Hyperkalemia 04/13/2014  . COPD GOLD III 10/22/2012  . Other and unspecified hyperlipidemia 10/22/2012  . Osteoarthritis 10/22/2012  . Essential hypertension 10/22/2012    Past Surgical History:  Procedure Laterality Date  . APPENDECTOMY  1984  . AV FISTULA PLACEMENT Right 05/21/2015   Procedure: Right Arm Brachiocephalic ARTERIOVENOUS (AV) FISTULA CREATION;  Surgeon: Angelia Mould, MD;  Location: [facility];  Service: Vascular;  Laterality: Right;  . COLON SURGERY  2012   partial colon removed - twisted bowel  . CORONARY STENT PLACEMENT  1999   2 stents  . CYSTOSCOPY/RETROGRADE/URETEROSCOPY Bilateral 04/23/2014   Procedure: CYSTOSCOPY  BILATERAL RETROGRADE,  LEFT URETEROSCOPY, LEFT STENT PLACEMENT;  Surgeon: Raynelle Bring, MD;  [facility];  Service: Urology;  Laterality: Bilateral;  . HERNIA REPAIR     Right inguinal       Home Medications    Prior to Admission medications   Medication Sig Start Date End Date Taking? Authorizing Provider  aspirin EC 325 MG tablet Take 1 tablet (325 mg total) by mouth daily. 09/26/15   Burnell Blanks, MD  atorvastatin (LIPITOR) 20 MG tablet Take 1 tablet (20 mg total) by mouth daily. 09/21/16   Burnell Blanks, MD  calcium acetate (PHOSLO) 667 MG  capsule Take 667-1,334 mg by mouth 3 (three) times daily with meals.  01/09/16   [provider]  cholecalciferol (VITAMIN D) 1000 units tablet Take 1,000 Units by mouth daily.    [provider]  guaiFENesin (MUCINEX) 600 MG 12 hr tablet Take 600 mg by mouth 2 (two) times daily.    [provider]  ipratropium-albuterol (DUONEB) 0.5-2.5 (3) MG/3ML SOLN Take 3 mLs by nebulization every 4 (four) hours as needed (for breathing).     [provider]  methocarbamol (ROBAXIN) 500 MG tablet Take 500 mg by mouth 2 (two) times daily as needed for muscle spasms.    [provider]  metoprolol succinate (TOPROL-XL) 50 MG 24 hr tablet Take 50 mg by mouth daily. Take with or immediately following a meal.    [provider]  midodrine (PROAMATINE) 10 MG tablet Take 10 mg by mouth as needed.    [provider]  multivitamin (RENA-VIT) TABS tablet Take 1 tablet by mouth daily.    [provider]  OXYGEN 1 lpm with sleep    [provider]  predniSONE (DELTASONE) 10 MG tablet Take 0.5 tablets (5 mg total) by mouth daily with breakfast. Take 1/2 tablet by mouth daily 10/22/16   Tanda Rockers, MD  SYMBICORT 160-4.5 MCG/ACT inhaler INHALE 2 PUFFS TWICE DAILY 11/30/16   Tanda Rockers, MD  tiotropium [facility] HANDIHALER) 18 MCG inhalation capsule One capsule each am 02/10/17   Tanda Rockers, MD    Family History Family History  Problem Relation Age of Onset  . Cancer Mother        Breast  . Heart disease Father   . Heart attack Father   . Parkinson's disease Brother     Social History Social History  Substance Use Topics  . Smoking status: Former Smoker    Packs/day: 1.00    Years: 49.00    Types: Cigarettes, Pipe, Cigars    Quit date: 02/21/2000  . Smokeless tobacco: Never Used  . Alcohol use No     Allergies   Yellow dyes (non-tartrazine); Levaquin [levofloxacin in d5w]; and Tussionex pennkinetic er [employer] polst-cpm  polst er]   Review of Systems Review of Systems Negative ROS aside from pertinent positives and negatives as listed in HPI   Physical Exam Updated Vital Signs BP 136/61 (BP Location: Left Arm)   Pulse 78   Temp 97.9 F (36.6 C) (Oral)   Resp 20   SpO2 98%   Physical Exam  Constitutional: He appears well-developed and well-nourished. No distress.  HENT:  Head: Normocephalic and atraumatic.  Right Ear: Tympanic membrane and ear canal normal.  Left Ear: Tympanic membrane and ear canal normal.  Nose: Nose normal.  Mouth/Throat: Uvula is midline, oropharynx is clear and moist and mucous membranes are normal.  Eyes: Pupils are equal, round, and  reactive to light.  Neck: Normal range of motion. Neck supple.  Cardiovascular: Normal rate and regular rhythm.   Pulmonary/Chest: Effort normal. Tachypnea noted. He has decreased breath sounds. He has wheezes. He has rhonchi. He has rales.  +increased effort of breathing on nasal cannula  Abdominal: Soft.  No signs of abdominal distention  Musculoskeletal:  No LE swelling Palpable fistula to right AC. Significant bruising and paper thin skin to bilateral arms.  Neurological: He is alert.  Acting at baseline  Skin: Skin is warm and dry. No rash noted.  Nursing note and vitals reviewed.   ED Treatments / Results  Labs (all labs ordered are listed, but only abnormal results are displayed) Labs Reviewed  BASIC METABOLIC PANEL - Abnormal; Notable for the following:       Result Value   Chloride 96 (*)    Glucose, Bld 107 (*)    BUN 65 (*)    Creatinine, Ser 6.55 (*)    Calcium 8.6 (*)    GFR calc non Af Amer 7 (*)    GFR calc Af Amer 8 (*)    All other components within normal limits  CBC - Abnormal; Notable for the following:    WBC 12.8 (*)    RBC 2.75 (*)    Hemoglobin 8.7 (*)    HCT 27.4 (*)    RDW 18.3 (*)    All other components within normal limits  I-STAT TROPONIN, ED - Abnormal; Notable for the following:     Troponin i, poc 0.56 (*)    All other components within normal limits  CULTURE, BLOOD (ROUTINE X 2)  CULTURE, BLOOD (ROUTINE X 2)  CULTURE, EXPECTORATED SPUTUM-ASSESSMENT  GRAM STAIN  STREP PNEUMONIAE URINARY ANTIGEN  I-STAT TROPONIN, ED    EKG  EKG Interpretation  Date/Time:  Monday March 15 2017 07:15:19 EDT Ventricular Rate:  78 PR Interval:  218 QRS Duration: 86 QT Interval:  392 QTC Calculation: 446 R Axis:   71 Text Interpretation:  Sinus rhythm with 1st degree A-V block T wave abnormality, consider inferior ischemia Abnormal ECG atrial fibrillation resolved since last tracing Confirmed by Dorie Rank [phone]) on 03/15/2017 8:58:59 AM       Radiology Dg Chest 2 View  Result Date: 03/15/2017 CLINICAL DATA:  Cough and shortness of breath.  Weakness. EXAM: CHEST  2 VIEW COMPARISON:  03/11/2017 CT.  Plain film 03/09/2017 FINDINGS: Hyperinflation. Midline trachea. Borderline cardiomegaly. Atherosclerosis in the transverse aorta. Developing small bilateral pleural effusions, greater on the right. No pneumothorax. Patchy right infrahilar and lower lobe airspace disease is new or progressive. Pulmonary interstitial prominence remains. IMPRESSION: Since 03/09/2017, developing right-sided airspace disease and small bilateral pleural effusions. Favor pneumonia. Asymmetric pulmonary edema felt less likely. Borderline cardiomegaly, with transverse aortic atherosclerosis. Electronically Signed   By: Abigail Miyamoto M.D.   On: 03/15/2017 07:51    Procedures Procedures (including critical care time)  Medications Ordered in ED Medications  ceFEPIme (MAXIPIME) 1 g in dextrose 5 % 50 mL IVPB (not administered)     Initial Impression / Assessment and Plan / ED Course  I have reviewed the triage vital signs and the nursing notes.  Pertinent labs & imaging results that were available during my care of the patient were reviewed by me and considered in my medical decision making (see chart for  details).    Acute on chronic respiratory failure. Recent on zpack, cipro 750 mg BID x 10 days. Repeat xray 9/18 much improved. No further  abx indicated at that time but now he has new developing right sided airspace disease and small bilateral pleural effusions, favoring pneumonia. -- He id due for dialysis today. He denies having been in afib recently -- He describes a vague, MSK like chest pain a couple of days ago, currently not having chest pain. This is likely demand ischemia but its higher then I would expect. He is also not a good candidate for cardiac  Will defer to admitting team regarding extent of cardiac work-up. Will repeat Troponin and admit to pulmonology team. -- started abx for healthcare acquired pneumonia, Cefipime and Vancomycin   -- admitted to triad hospitalist, telemetry. Echo added on  Final Clinical Impressions(s) / ED Diagnoses   Final diagnoses:  Acute on chronic respiratory failure with hypoxia ([place])  Recurrent pneumonia    New Prescriptions New Prescriptions   No medications on file     Delos Haring, PA-C 03/15/17 2111

## 2017-03-15 NOTE — ED Notes (Signed)
Pt ordered lunch tray by Network engineer

## 2017-03-15 NOTE — ED Notes (Signed)
Pt placed in room at this time.

## 2017-03-15 NOTE — Consult Note (Signed)
Cardiology Consultation:   Patient ID: Terry [name].; [id number]; 1934-06-30   Admit date: 03/15/2017 Date of Consult: 03/15/2017  Primary Care Provider: [name], DO Primary Cardiologist: Dr [name]    Patient Profile:   Terry [name]. is a 82 y.o. male with a hx of ESRD on HD MWF, COPD/emphysema, PAF off Coumadin, hypertension, hyperlipidemia, CVA recurrent pneumonia who is being seen today for the evaluation of chest pain at the request of Dr Marily Memos.  History of Present Illness:   Terry [name] was admitted with worsening cough, shortness of breath, DOE, inability to lie flat. Apparently his symptoms had started when he was in [place] for a wedding, having flown there, and have progressively worsened. He saw Dr. Shyrl Numbers last week and had a chest x-ray that was negative for pneumonia and then a CT angiogram which was negative for PE with nodular densities over the right long thought to be inflammatory versus infectious. He was referred back for cardiology evaluation.  The patient was last seen in our office on 08/12/2016 by Dr. [name] at which time he was doing well.  It was noted that the patient had previously been followed in [facility] prior to moving here to [place] in 2014. Cath records received from [facility] showed a cardiac cath in 11/1997 that showed Mild distal Left main stenosis, 25% proximal LAD, minor irregularities Circumflex, 95% mid RCA stenosis treated with 3.5 x 32 mm bare metal stent. There was a ? Overlapping 3.5 x 18 mm bare metal stent more proximally. Echo 02/03/13 with LVEF=50-55%. He had normal carotid dopplers in [place] 2013. He was admitted to [facility] October 2015 with ruptured kidney cyst. Atrial fibrillation diagnosed in December 2015 at an office visit. He was initially started on Eliquis but progressed to ESRD so this was stopped. He was started on coumadin but had GI blood loss and severe bruising so this was stopped. He has  been on low dose beta blocker therapy. He had been started on amiodarone but this was stopped. Low risk stress myoview September 2016.    The patient attends his dialysis regularly and completes his entire treatments achieving adequate weight reduction. Evaluation in the ED showed elevated WBCs at 12.8 and chest x-ray showed right-sided infiltrate with small bilateral pleural effusions. Troponins are 0.56 , 0.53 and 0.54. Hemoglobin is down to 8.7. Potassium was 4.5  The patient is currently in dialysis. He was taken there from the ED. The staff notes that the patient had an episode of unresponsiveness that lasted about 4 minutes. His oxygen saturation was low at the time, but his BP was stable and he had no arrhythmias. They applied oxygen with 100% non-rebreather and the pt came back. The patient says that he was sitting up in the bed, forward when his vision started to darken and then he went out. He did not have any chest pain and does not recall if he had increased shortness of breath. He has one other similar episode in 2016 when he was at outpatient dialysis and he ended up in the hospital intubated.  He says that he usually uses oxygen at night, but after his trip to [place], 9 days ago her has needed to wear oxygen around the clock. He has had shortness of breath at rest and with activity. Yesterday he was able to move around with his oxygen and not much difficulty breathing. He awoke at about 3:30 this morning with significant dyspnea and his  wife turned up his oxygen with no relief so they called 911. He is just finishing his dialysis treatment and is breathing much better.   EKG shows sinus rhythm with first-degree AV block, 78 bpm, no acute ischemic changes  Past Medical History:  Diagnosis Date  . Acute and chronic respiratory failure 10/22/2012  . Atrial fibrillation ([place])   . CAD (coronary artery disease) [place], [place]  . COPD (chronic obstructive pulmonary disease) ([place])   .  Flu 09/24/2016   influenza b  . High cholesterol   . Hypertension   . Kidney disease    CKD4, sees [place] [facility], considering dialysis  . Osteoarthritis 10/22/2012  . Pneumonia April 2014  . Shortness of breath dyspnea   . Stroke [facility]) 2012    Past Surgical History:  Procedure Laterality Date  . APPENDECTOMY  1984  . AV FISTULA PLACEMENT Right 05/21/2015   Procedure: Right Arm Brachiocephalic ARTERIOVENOUS (AV) FISTULA CREATION;  Surgeon: Angelia Mould, MD;  Location: [facility];  Service: Vascular;  Laterality: Right;  . COLON SURGERY  2012   partial colon removed - twisted bowel  . CORONARY STENT PLACEMENT  1999   2 stents  . CYSTOSCOPY/RETROGRADE/URETEROSCOPY Bilateral 04/23/2014   Procedure: CYSTOSCOPY BILATERAL RETROGRADE,  LEFT URETEROSCOPY, LEFT STENT PLACEMENT;  Surgeon: Raynelle Bring, MD;  Location: [facility];  Service: Urology;  Laterality: Bilateral;  . HERNIA REPAIR     Right inguinal     Home Medications:  Prior to Admission medications   Medication Sig Start Date End Date Taking? Authorizing Provider  aspirin EC 325 MG tablet Take 1 tablet (325 mg total) by mouth daily. 09/26/15   Burnell Blanks, MD  atorvastatin (LIPITOR) 20 MG tablet Take 1 tablet (20 mg total) by mouth daily. 09/21/16   Burnell Blanks, MD  calcium acetate (PHOSLO) 667 MG capsule Take 667-1,334 mg by mouth 3 (three) times daily with meals.  01/09/16   [provider]  cholecalciferol (VITAMIN D) 1000 units tablet Take 1,000 Units by mouth daily.    [provider]  guaiFENesin (MUCINEX) 600 MG 12 hr tablet Take 600 mg by mouth 2 (two) times daily.    [provider]  ipratropium-albuterol (DUONEB) 0.5-2.5 (3) MG/3ML SOLN Take 3 mLs by nebulization every 4 (four) hours as needed (for breathing).     [provider]  methocarbamol (ROBAXIN) 500 MG tablet Take 500 mg by mouth 2 (two) times daily as needed for muscle spasms.    [provider]    metoprolol succinate (TOPROL-XL) 50 MG 24 hr tablet Take 50 mg by mouth daily. Take with or immediately following a meal.    [provider]  midodrine (PROAMATINE) 10 MG tablet Take 10 mg by mouth as needed.    [provider]  multivitamin (RENA-VIT) TABS tablet Take 1 tablet by mouth daily.    [provider]  OXYGEN 1 lpm with sleep    [provider]  predniSONE (DELTASONE) 10 MG tablet Take 0.5 tablets (5 mg total) by mouth daily with breakfast. Take 1/2 tablet by mouth daily 10/22/16   Tanda Rockers, MD  SYMBICORT 160-4.5 MCG/ACT inhaler INHALE 2 PUFFS TWICE DAILY 11/30/16   Tanda Rockers, MD  tiotropium [facility] HANDIHALER) 18 MCG inhalation capsule One capsule each am 02/10/17   Tanda Rockers, MD    Inpatient Medications: Scheduled Meds: . darbepoetin (ARANESP) injection - DIALYSIS  100 mcg Intravenous Q Mon-HD  . doxercalciferol  3 mcg Intravenous Q M,W,F-HD  . midodrine      . midodrine  10 mg Oral Q M,W,F-HD   Continuous Infusions: . sodium chloride    . sodium chloride    . [START ON 03/17/2017] ceFEPime (MAXIPIME) IV    . [START ON 03/17/2017] ferric gluconate (FERRLECIT/NULECIT) IV    . [START ON 03/17/2017] vancomycin     PRN Meds: sodium chloride, sodium chloride, alteplase, heparin, heparin, lidocaine (PF), lidocaine-prilocaine, pentafluoroprop-tetrafluoroeth  Allergies:    Allergies  Allergen Reactions  . Yellow Dyes (Non-Tartrazine) Other (See Comments)    Burns arms Cough medications (tussionex and benzonatate ok)  . Levaquin [Levofloxacin In D5w] Itching  . Tussionex Pennkinetic Er [Hydrocod Polst-Cpm Polst Er] Other (See Comments)    Unknown on MAR    Social History:   Social History   Social History  . Marital status: Married    Spouse name: N/A  . Number of children: 3  . Years of education: N/A   Occupational History  . retired-worked in radio Retired   Social History Main Topics  . Smoking status: Former  Smoker    Packs/day: 1.00    Years: 49.00    Types: Cigarettes, Pipe, Cigars    Quit date: 02/21/2000  . Smokeless tobacco: Never Used  . Alcohol use No  . Drug use: No  . Sexual activity: Not on file   Other Topics Concern  . Not on file   Social History Narrative   Work or School: retired [employer] Situation: lives with wife in [place] Beliefs: Clinch regular; poor diet             Family History:    Family History  Problem Relation Age of Onset  . Cancer Mother        Breast  . Heart disease Father   . Heart attack Father   . Parkinson's disease Brother      ROS:  Please see the history of present illness.  ROS  All other ROS reviewed and negative.     Physical Exam/Data:   Vitals:   03/15/17 1416 03/15/17 1430 03/15/17 1445 03/15/17 1500  BP: (!) 117/51 (!) 137/52 (!) 122/52 (!) 117/54  Pulse: 66 72 71 73  Resp:      Temp:      TempSrc:      SpO2:       No intake or output data in the 24 hours ending 03/15/17 1514 Filed Weights   There is no height or weight on file to calculate BMI.  General:  Chronically ill appearing male, in no acute distress HEENT: normal Lymph: no adenopathy Neck: no JVD Endocrine:  No thryomegaly Vascular: No carotid bruits; FA pulses 2+ bilaterally without bruits  Cardiac:  normal S1, S2; RRR; no murmur  Lungs:  clear to auscultation bilaterally, no wheezing, rhonchi or rales  Abd: soft, nontender, no hepatomegaly  Ext: no edema Musculoskeletal:  No deformities, BUE and BLE strength normal and equal Skin: warm and dry  Neuro:  CNs 2-12 intact, no focal abnormalities noted Psych:  Normal affect   EKG:  The EKG was personally reviewed and demonstrates:  sinus rhythm with first-degree AV block, 78 bpm, no acute ischemic changes Telemetry:  Telemetry was personally reviewed and demonstrates:  Sinus rhythm in the 70's-80's with PACs.   Relevant CV  Studies:  Echocardiogram 06/22/2015 Study Conclusions  -  Left ventricle: The cavity size was normal. There was moderate   concentric hypertrophy. Systolic function was vigorous. The   estimated ejection fraction was in the range of 65% to 70%. Wall   motion was normal; there were no regional wall motion   abnormalities. The study was not technically sufficient to allow   evaluation of LV diastolic dysfunction due to atrial   fibrillation. - Aortic valve: There was mild stenosis. Valve area (VTI): 1.35   cm^2. Valve area (Vmax): 1.5 cm^2. Valve area (Vmean): 1.52 cm^2. - Aortic root: The aortic root was normal in size. - Mitral valve: Structurally normal valve. There was mild   regurgitation. - Left atrium: The atrium was normal in size. - Right ventricle: The cavity size was mildly dilated. Wall   thickness was normal. Systolic function was normal. - Right atrium: The atrium was mildly dilated. - Tricuspid valve: There was mild regurgitation. - Pulmonic valve: There was no regurgitation. - Pulmonary arteries: Systolic pressure was mildly increased. PA   peak pressure: 39 mm Hg (S). - Inferior vena cava: The vessel was normal in size. - Pericardium, extracardiac: There was no pericardial effusion.  Nuclear stress test 03/20/2015 Study Highlights   Nuclear stress EF: 63%.  There was no ST segment deviation noted during stress.  No T wave inversion was noted during stress.  Defect 1: There is a medium defect of moderate severity present in the mid inferolateral and apical inferior location.  Findings consistent with ischemia.  This is a low risk study.  The left ventricular ejection fraction is normal (55-65%).   Low risk stress nuclear study with a moderate area of inferolateral ischemia and normal left ventricular global systolic function.    Laboratory Data:  Chemistry Recent Labs Lab 03/15/17 0714  NA 137  K 4.5  CL 96*  CO2 26  GLUCOSE 107*  BUN 65*   CREATININE 6.55*  CALCIUM 8.6*  GFRNONAA 7*  GFRAA 8*  ANIONGAP 15    No results for input(s): PROT, ALBUMIN, AST, ALT, ALKPHOS, BILITOT in the last 168 hours. Hematology Recent Labs Lab 03/11/17 1052 03/15/17 0714  WBC 9.9 12.8*  RBC 2.86* 2.75*  HGB 9.3* 8.7*  HCT 28.4* 27.4*  MCV 99.2 99.6  MCH  --  31.6  MCHC 32.8 31.8  RDW 19.1* 18.3*  PLT 203.0 224   Cardiac Enzymes Recent Labs Lab 03/15/17 1223  TROPONINI 0.54*    Recent Labs Lab 03/15/17 0730 03/15/17 0910  TROPIPOC 0.56* 0.53*    BNPNo results for input(s): BNP, PROBNP in the last 168 hours.  DDimer No results for input(s): DDIMER in the last 168 hours.  Radiology/Studies:  Dg Chest 2 View  Result Date: 03/15/2017 CLINICAL DATA:  Cough and shortness of breath.  Weakness. EXAM: CHEST  2 VIEW COMPARISON:  03/11/2017 CT.  Plain film 03/09/2017 FINDINGS: Hyperinflation. Midline trachea. Borderline cardiomegaly. Atherosclerosis in the transverse aorta. Developing small bilateral pleural effusions, greater on the right. No pneumothorax. Patchy right infrahilar and lower lobe airspace disease is new or progressive. Pulmonary interstitial prominence remains. IMPRESSION: Since 03/09/2017, developing right-sided airspace disease and small bilateral pleural effusions. Favor pneumonia. Asymmetric pulmonary edema felt less likely. Borderline cardiomegaly, with transverse aortic atherosclerosis. Electronically Signed   By: Abigail Miyamoto M.D.   On: 03/15/2017 07:51    Assessment and Plan:   Chest pain -Patient was admitted today with worsening cough, shortness of breath, DOE and orthopnea. He denies having any chest pain now or within the  last week.  -he has a remote history of stenting to RCA in 1999. He had low risk myoview in 02/2015 with normal EF.  -He had a period of unresponsiveness while on dialysis at which time his oxygen level was low. No hypotension or arrhythmias noted. He responded after oxygen with 100%  non-rebreather was applied. He denies any chest pain at the time.  -Troponins are mildly elevated in a flat pattern, consistent with end-stage renal disease -EKG was without ischemic changes -He is treated with aspirin, beta blocker and statin. -He denies any chest pain today or recently. Will see what echo results are. If normal would not pursue any further cardiac testing and continue to treat for pneumonia.   Shortness of breath -Recent worsening of cough, shortness of breath, DOE and orthopnea (has chronic orthpnea) -WBCs were elevated at 12.8, chest x-ray showed right-sided infiltrate with small bilateral pleural effusions -Previous echocardiogram in 05/2015 showed moderate concentric hypertrophy, EF 65-70%, the study was not technically sufficient to allow evaluation of LV diastolic dysfunction due to atrial fibrillation -He is being treated for HCAP on IV antibiotics per primary team -Patient has echocardiogram ordered -Now with improved breathing after dialysis. Still looks mildly short of breath with talking.   Paroxysmal atrial fibrillation -The patient is maintaining sinus rhythm. He did not tolerate Coumadin due to GI bleeding in the past. He cannot take a NOAC with hemodialysis. Chadsvasc is 6, however it is felt that it would not be safe for patient to be anticoagulated he is tolerating aspirin 325 mg daily. He takes Toprol 25 mg the night before dialysis and 50 mg on the other nights  ESRD -Hemodialysis MWF. Nephrology is following inpatient.  Pt was taken to dialysis from the ED and has just completed his treatment.   Hypertension -The patient has had low BPs on hemodialysis in the past. He has Midodrine as needed -Blood pressure is satisfactory. Staff reports no hypotension during this dialysis treatment.   Hyperlipidemia -Lipids followed by primary care. Continue statin  For questions or updates, please contact [place] Please consult [email or web address] for contact info  under Cardiology/STEMI.   Signed, Daune Perch, NP  03/15/2017 3:14 PM

## 2017-03-15 NOTE — Progress Notes (Signed)
Attending MD Dr Dillon Bjork notified regarding pt.'s Troponin level of 0.54. MD ordered to monitor pt. Pt is not in any pain and alert and responsive.

## 2017-03-15 NOTE — ED Notes (Signed)
Pt taken up to Dialysis

## 2017-03-15 NOTE — ED Notes (Signed)
CRITICAL LAB : DR. Tomi Bamberger @ 7:40.   Tonia Ghent, NT made aware at [facility]. H, RN.

## 2017-03-15 NOTE — ED Provider Notes (Signed)
Pt  Presents with shortness of breath.  Denies chest pain.  Hx of chronic kidney disease.  CXR with possible pneumonia.  Increased for troponin.  At risk for health care associated pna.  Will start on abx.  Plan on admission.  Medical screening examination/treatment/procedure(s) were conducted as a shared visit with non-physician practitioner(s) and myself.  I personally evaluated the patient during the encounter.   EKG Interpretation  Date/Time:  Monday March 15 2017 07:15:19 EDT Ventricular Rate:  78 PR Interval:  218 QRS Duration: 86 QT Interval:  392 QTC Calculation: 446 R Axis:   71 Text Interpretation:  Sinus rhythm with 1st degree A-V block T wave abnormality, consider inferior ischemia Abnormal ECG atrial fibrillation resolved since last tracing Confirmed by Dorie Rank [phone]) on 03/15/2017 8:58:59 AM         Dorie Rank, MD 03/15/17 [phone]

## 2017-03-15 NOTE — Progress Notes (Signed)
Pharmacy Antibiotic Note  Terry Spark. is a 82 y.o. male admitted on 03/15/2017 with pneumonia.  Pharmacy has been consulted for vancomycin dosing. Pt is afebrile but WBC is elevated at 12.8. Pt with ESRD on HD.   Plan: Vancomycin 1500mg  IV x 1 then 750mg  QHD F/u renal plans, C&S, clinical status and pre-HD vanc level when appropriate     Temp (24hrs), Avg:97.9 F (36.6 C), Min:97.9 F (36.6 C), Max:97.9 F (36.6 C)   Recent Labs Lab 03/11/17 1052 03/15/17 0714  WBC 9.9 12.8*  CREATININE  --  6.55*    Estimated Creatinine Clearance: 8.8 mL/min (A) (by C-G formula based on SCr of 6.55 mg/dL (H)).    Allergies  Allergen Reactions  . Yellow Dyes (Non-Tartrazine) Other (See Comments)    Burns arms Cough medications (tussionex and benzonatate ok)  . Levaquin [Levofloxacin In D5w] Itching  . Tussionex Pennkinetic Er [Hydrocod Polst-Cpm Polst Er] Other (See Comments)    Unknown on MAR    Antimicrobials this admission: Vanc 9/24>> Cefepime 9/24>>  Dose adjustments this admission: N/A  Microbiology results: Pending  Thank you for allowing pharmacy to be a part of this patient's care.  Brita Jurgensen, Rande Lawman 03/15/2017 9:14 AM

## 2017-03-15 NOTE — Progress Notes (Signed)
Pharmacy Antibiotic Note  Terry [name]. is a 82 y.o. male admitted on 03/15/2017 with pneumonia.  Pt with ESRD on HD.  Pharmacy consulted for vancomycin dosing and vancomycin 1500 mg IV x1 loading dose given this morning.  Patient then went to Hemodialysis.  HD completed x4h, BFR 400+. We were unable to given post HD Vancomycin dose in HD due to HD about to end, thus I will order to give Vancomycin 500mg  IV x1 vial peripheral site tonight.    Plan: Vancomycin 500mg  IV x 1 tonight via peripheral site.  Then Vancomycin 750mg  IV qHD MWF, starting 03/17/17.  F/u renal plans, C&S, clinical status and pre-HD vanc level when appropriate  Weight:  (UTD, not stable to stand up)  Temp (24hrs), Avg:97.7 F (36.5 C), Min:97.6 F (36.4 C), Max:97.9 F (36.6 C)   Recent Labs Lab 03/11/17 1052 03/15/17 0714  WBC 9.9 12.8*  CREATININE  --  6.55*    Estimated Creatinine Clearance: 8.8 mL/min (A) (by C-G formula based on SCr of 6.55 mg/dL (H)).    Allergies  Allergen Reactions  . Yellow Dyes (Non-Tartrazine) Other (See Comments)    Burns arms Cough medications (tussionex and benzonatate ok)  . Levaquin [Levofloxacin In D5w] Itching  . Tussionex Pennkinetic Er [Hydrocod Polst-Cpm Polst Er] Other (See Comments)    Unknown on MAR    Antimicrobials this admission: Vanc 9/24>> Cefepime 9/24>>  Dose adjustments this admission: N/A  Microbiology results: Pending  Thank you for allowing pharmacy to be a part of this patient's care. Nicole Cella, [place] Clinical Pharmacist [phone] (03/15/17 3:30pm-10:30pm) [name] 03/15/2017 5:37 PM

## 2017-03-15 NOTE — ED Triage Notes (Signed)
Pt presents with SOB that began at 03:45am while sleeping; pt states he is on 1L O2 via [place] while sleeping but increased it d/t SOB; pt denies CP, abd pain, n/v, fevers

## 2017-03-15 NOTE — ED Notes (Signed)
Pt stood up to urinate and started to become SOB. MD made aware and placing orders

## 2017-03-15 NOTE — Procedures (Signed)
I was present at this dialysis session. I have reviewed the session itself and made appropriate changes.   Filed Weights     Recent Labs Lab 03/15/17 0714  NA 137  K 4.5  CL 96*  CO2 26  GLUCOSE 107*  BUN 65*  CREATININE 6.55*  CALCIUM 8.6*     Recent Labs Lab 03/11/17 1052 03/15/17 0714  WBC 9.9 12.8*  NEUTROABS 8.5*  --   HGB 9.3* 8.7*  HCT 28.4* 27.4*  MCV 99.2 99.6  PLT 203.0 224    Scheduled Meds: . darbepoetin (ARANESP) injection - DIALYSIS  100 mcg Intravenous Q Mon-HD  . doxercalciferol  3 mcg Intravenous Q M,W,F-HD  . midodrine      . midodrine  10 mg Oral Q M,W,F-HD   Continuous Infusions: . sodium chloride    . sodium chloride    . [START ON 03/17/2017] ceFEPime (MAXIPIME) IV    . [START ON 03/17/2017] ferric gluconate (FERRLECIT/NULECIT) IV    . [START ON 03/17/2017] vancomycin     PRN Meds:.sodium chloride, sodium chloride, alteplase, heparin, heparin, lidocaine (PF), lidocaine-prilocaine, pentafluoroprop-tetrafluoroeth   Donetta Potts,  MD 03/15/2017, 1:30 PM

## 2017-03-15 NOTE — Progress Notes (Signed)
Pt became unresponsive while on Dialysis and when O2 sat were checked, it was in upper 80's. Bp was  WNL. Pt was put on non- rebreather with O2 sat of 100%. Pt is alert and responsive. Nephrologist was notified and HD tx continues.

## 2017-03-15 NOTE — ED Notes (Signed)
Pt coming from X-ray.  

## 2017-03-16 ENCOUNTER — Observation Stay (HOSPITAL_BASED_OUTPATIENT_CLINIC_OR_DEPARTMENT_OTHER): Payer: Medicare PPO

## 2017-03-16 ENCOUNTER — Observation Stay (HOSPITAL_COMMUNITY): Payer: Medicare PPO

## 2017-03-16 DIAGNOSIS — R079 Chest pain, unspecified: Secondary | ICD-10-CM | POA: Diagnosis not present

## 2017-03-16 DIAGNOSIS — I480 Paroxysmal atrial fibrillation: Secondary | ICD-10-CM | POA: Diagnosis not present

## 2017-03-16 DIAGNOSIS — N186 End stage renal disease: Secondary | ICD-10-CM | POA: Diagnosis not present

## 2017-03-16 DIAGNOSIS — E7800 Pure hypercholesterolemia, unspecified: Secondary | ICD-10-CM

## 2017-03-16 DIAGNOSIS — Z992 Dependence on renal dialysis: Secondary | ICD-10-CM

## 2017-03-16 DIAGNOSIS — I350 Nonrheumatic aortic (valve) stenosis: Secondary | ICD-10-CM

## 2017-03-16 DIAGNOSIS — J189 Pneumonia, unspecified organism: Secondary | ICD-10-CM | POA: Diagnosis not present

## 2017-03-16 LAB — ECHOCARDIOGRAM COMPLETE
AV Peak grad: 25 mmHg
AV peak Index: 0.68
AV pk vel: 248 cm/s
AVAREAMEANV: 1.28 cm2
AVAREAMEANVIN: 0.71 cm2/m2
AVAREAVTI: 1.22 cm2
AVAREAVTIIND: 0.67 cm2/m2
AVCELMEANRAT: 0.41
AVG: 13 mmHg
Ao pk vel: 0.39 m/s
CHL CUP AV VALUE AREA INDEX: 0.67
CHL CUP AV VEL: 1.21
CHL CUP STROKE VOLUME: 47 mL
DOP CAL AO MEAN VELOCITY: 165 cm/s
E/e' ratio: 9.46
EWDT: 187 ms
FS: 25 % — AB (ref 28–44)
HEIGHTINCHES: 68 in
IV/PV OW: 0.8
LA diam end sys: 25 mm
LA diam index: 1.38 cm/m2
LA vol A4C: 60.1 ml
LA vol index: 33.8 mL/m2
LASIZE: 25 mm
LAVOL: 61.1 mL
LDCA: 3.14 cm2
LV E/e'average: 9.46
LV e' LATERAL: 12.9 cm/s
LV sys vol index: 41 mL/m2
LV sys vol: 75 mL — AB (ref 21–61)
LVDIAVOL: 121 mL (ref 62–150)
LVDIAVOLIN: 67 mL/m2
LVEEMED: 9.46
LVOT VTI: 20.7 cm
LVOT peak grad rest: 4 mmHg
LVOT peak vel: 96.1 cm/s
LVOTD: 20 mm
LVOTSV: 65 mL
LVOTVTI: 0.39 cm
MV Dec: 187
MV Peak grad: 6 mmHg
MVPKAVEL: 126 m/s
MVPKEVEL: 122 m/s
P 1/2 time: 208 ms
PW: 15 mm — AB (ref 0.6–1.1)
RV LATERAL S' VELOCITY: 12.1 cm/s
RV TAPSE: 19.3 mm
Simpson's disk: 38
TDI e' lateral: 12.9
TDI e' medial: 7.32
VTI: 53.5 cm
Valve area: 1.21 cm2
WEIGHTICAEL: 2395.08 [oz_av]

## 2017-03-16 LAB — TROPONIN I: Troponin I: 0.43 ng/mL (ref <0.03)

## 2017-03-16 LAB — HIV ANTIBODY (ROUTINE TESTING W REFLEX): HIV Screen 4th Generation wRfx: NONREACTIVE

## 2017-03-16 LAB — MRSA PCR SCREENING: MRSA BY PCR: POSITIVE — AB

## 2017-03-16 MED ORDER — ALBUTEROL SULFATE (2.5 MG/3ML) 0.083% IN NEBU
2.5000 mg | INHALATION_SOLUTION | Freq: Four times a day (QID) | RESPIRATORY_TRACT | Status: DC
  Administered 2017-03-16 – 2017-03-18 (×7): 2.5 mg via RESPIRATORY_TRACT
  Filled 2017-03-16 (×7): qty 3

## 2017-03-16 MED ORDER — ALBUTEROL SULFATE (2.5 MG/3ML) 0.083% IN NEBU: 2.5000 mg | INHALATION_SOLUTION | RESPIRATORY_TRACT | Status: DC | PRN

## 2017-03-16 MED ORDER — POLYVINYL ALCOHOL 1.4 % OP SOLN
1.0000 [drp] | Freq: Four times a day (QID) | OPHTHALMIC | Status: DC | PRN
  Administered 2017-03-17: 1 [drp] via OPHTHALMIC
  Filled 2017-03-16: qty 15

## 2017-03-16 MED ORDER — ORAL CARE MOUTH RINSE
15.0000 mL | Freq: Two times a day (BID) | OROMUCOSAL | Status: DC
  Administered 2017-03-16 – 2017-03-17 (×4): 15 mL via OROMUCOSAL

## 2017-03-16 MED ORDER — HYPROMELLOSE (GONIOSCOPIC) 2.5 % OP SOLN
1.0000 [drp] | Freq: Four times a day (QID) | OPHTHALMIC | Status: DC | PRN
  Filled 2017-03-16: qty 15

## 2017-03-16 MED ORDER — GUAIFENESIN ER 600 MG PO TB12
600.0000 mg | ORAL_TABLET | Freq: Two times a day (BID) | ORAL | Status: DC
  Administered 2017-03-16 – 2017-03-18 (×5): 600 mg via ORAL
  Filled 2017-03-16 (×5): qty 1

## 2017-03-16 MED ORDER — RENA-VITE PO TABS
1.0000 | ORAL_TABLET | Freq: Every day | ORAL | Status: DC
  Administered 2017-03-17: 1 via ORAL
  Filled 2017-03-16: qty 1

## 2017-03-16 MED ORDER — CEFEPIME HCL 1 G IJ SOLR
1.0000 g | Freq: Once | INTRAMUSCULAR | Status: AC
  Administered 2017-03-16: 1 g via INTRAVENOUS
  Filled 2017-03-16: qty 1

## 2017-03-16 NOTE — Progress Notes (Signed)
   03/16/17 1200  Clinical Encounter Type  Visited With Patient  Visit Type Follow-up  Referral From Nurse  Consult/Referral To Chaplain  Recommendations revisit  Chaplain visited with PT to get info on what was needed for Advanced Directive.  No family was present and patient was sleeping.  Will follow Up

## 2017-03-16 NOTE — Progress Notes (Signed)
Initial Nutrition Assessment  DOCUMENTATION CODES:   Non-severe (moderate) malnutrition in context of chronic illness  INTERVENTION:    -Continue Nepro Shake po BID, each supplement provides 425 kcal and 19 grams protein  NUTRITION DIAGNOSIS:   Malnutrition (Moderate) related to chronic illness (ESRD on HD, COPD) as evidenced by mild depletion of body fat, mild depletion of muscle mass.  GOAL:   Patient will meet greater than or equal to 90% of their needs  MONITOR:   Supplement acceptance  REASON FOR ASSESSMENT:   Malnutrition Screening Tool    ASSESSMENT:   82 yo male admitted with HCAP, fluid overload with ESRD on HD. Pt with hx of COPD, CAD, HTN, ESRD on HD,  dyslipidemia  Pt reports good appetite at home, "too good." Pt reports he eats 3 meals per day, no snacks, no supplements. Pt reports RD at outpatient HD talks with him on a regular basis and assists him with his diet   Pt reports weight has been stable. EDW 68.5, currently under dry weight Per MD note, pt IDWG are typically <2 L.   Nutrition-Focused physical exam completed. Findings are mild fat depletion, mild to moderate muscle depletion, and no edema.   Labs: reviewed Meds: Phoslo, hectoral, cholecalciferol, ferric gluconate, Rena-Vit, prednisone  Diet Order:  Diet renal with fluid restriction Fluid restriction: 1200 mL Fluid; Room service appropriate? Yes; Fluid consistency: Thin  Skin:   (pressure injury on sacrum)  Last BM:  no documented BM  Height:   Ht Readings from Last 1 Encounters:  03/15/17 5\' 8"  (1.727 m)    Weight:   Wt Readings from Last 1 Encounters:  03/15/17 149 lb 11.1 oz (67.9 kg)    Ideal Body Weight:     BMI:  Body mass index is 22.76 kg/m.  Estimated Nutritional Needs:   Kcal:  2040-2245 kcals  Protein:  100-112 g  Fluid:  1000 mL plus UOP  EDUCATION NEEDS:   No education needs identified at this time  [place], [place], LDN [phone] Pager  [phone] Weekend/On-Call Pager

## 2017-03-16 NOTE — Progress Notes (Signed)
Progress Note  Patient Name: Terry [name]. Date of Encounter: 03/16/2017  Primary Cardiologist: Dr. Julianne Handler  Subjective   Feeling well. Denies any chest pain or shortness of breath. No recurrent syncopal episodes. No lightheadedness or dizziness.  Inpatient Medications    Scheduled Meds: . albuterol  2.5 mg Nebulization QID  . aspirin EC  325 mg Oral Daily  . atorvastatin  20 mg Oral Daily  . calcium acetate  667-1,334 mg Oral TID WC  . cholecalciferol  1,000 Units Oral Daily  . darbepoetin (ARANESP) injection - DIALYSIS  100 mcg Intravenous Q Mon-HD  . doxercalciferol  3 mcg Intravenous Q M,W,F-HD  . feeding supplement (NEPRO CARB STEADY)  237 mL Oral BID BM  . heparin  5,000 Units Subcutaneous Q8H  . mouth rinse  15 mL Mouth Rinse BID  . metoprolol succinate  50 mg Oral Daily  . midodrine  10 mg Oral Q M,W,F-HD  . mometasone-formoterol  2 puff Inhalation BID  . multivitamin  1 tablet Oral Daily  . predniSONE  5 mg Oral Q breakfast  . tiotropium  18 mcg Inhalation Daily   Continuous Infusions: . sodium chloride    . sodium chloride    . [START ON 03/17/2017] ceFEPime (MAXIPIME) IV    . [START ON 03/17/2017] ferric gluconate (FERRLECIT/NULECIT) IV    . [START ON 03/17/2017] vancomycin     PRN Meds: sodium chloride, sodium chloride, alteplase, heparin, heparin, ipratropium-albuterol, lidocaine (PF), lidocaine-prilocaine, methocarbamol, pentafluoroprop-tetrafluoroeth   Vital Signs    Vitals:   03/16/17 0206 03/16/17 0545 03/16/17 0738 03/16/17 0915  BP:      Pulse:      Resp:      Temp:      TempSrc:      SpO2: 98% 96% 91% 93%  Weight:      Height:        Intake/Output Summary (Last 24 hours) at 03/16/17 0922 Last data filed at 03/16/17 0620  Gross per 24 hour  Intake              120 ml  Output             2500 ml  Net            -2380 ml   Filed Weights   03/15/17 1740 03/15/17 2138  Weight: 67.9 kg (149 lb 9.6 oz) 67.9 kg (149 lb 11.1 oz)     Telemetry    Sinus rhythm. No events - Personally Reviewed  ECG    n/a - Personally Reviewed  Physical Exam   GEN: Well-appearing.  No acute distress.   Neck: No JVD Cardiac: RRR, no murmurs, rubs, or gallops.  Respiratory: Clear to auscultation bilaterally.  No crackles, wheezes, or rhonchi. GI: Soft, nontender, non-distended  MS: No edema; No deformity. Neuro:  Nonfocal  Psych: Normal affect   Labs    Chemistry Recent Labs Lab 03/15/17 0714  NA 137  K 4.5  CL 96*  CO2 26  GLUCOSE 107*  BUN 65*  CREATININE 6.55*  CALCIUM 8.6*  GFRNONAA 7*  GFRAA 8*  ANIONGAP 15     Hematology Recent Labs Lab 03/11/17 1052 03/15/17 0714  WBC 9.9 12.8*  RBC 2.86* 2.75*  HGB 9.3* 8.7*  HCT 28.4* 27.4*  MCV 99.2 99.6  MCH  --  31.6  MCHC 32.8 31.8  RDW 19.1* 18.3*  PLT 203.0 224    Cardiac Enzymes Recent Labs Lab 03/15/17 1223 03/15/17 1740 03/15/17  2358  TROPONINI 0.54* 0.54* 0.43*    Recent Labs Lab 03/15/17 0730 03/15/17 0910  TROPIPOC 0.56* 0.53*     BNPNo results for input(s): BNP, PROBNP in the last 168 hours.   DDimer No results for input(s): DDIMER in the last 168 hours.   Radiology    Dg Chest 2 View  Result Date: 03/16/2017 CLINICAL DATA:  Pneumonia. Improved breathing today. History of COPD, CHF, and coronary artery disease. EXAM: CHEST  2 VIEW COMPARISON:  PA and lateral chest x-ray of March 15, 2017 FINDINGS: The lungs remain mildly hyperinflated. There has been interval improvement in the appearance of the pulmonary interstitium bilaterally. There is a small right pleural effusion and trace left pleural effusion. The cardiac silhouette is normal. The pulmonary vascularity is not engorged. There is calcification in the wall of the aortic arch. There is multilevel degenerative disc disease of the thoracic spine. IMPRESSION: Improving interstitial infiltrates bilaterally. Small bilateral pleural effusions. No CHF. Thoracic aortic  atherosclerosis. Electronically Signed   By: David  Martinique M.D.   On: 03/16/2017 07:42   Dg Chest 2 View  Result Date: 03/15/2017 CLINICAL DATA:  Cough and shortness of breath.  Weakness. EXAM: CHEST  2 VIEW COMPARISON:  03/11/2017 CT.  Plain film 03/09/2017 FINDINGS: Hyperinflation. Midline trachea. Borderline cardiomegaly. Atherosclerosis in the transverse aorta. Developing small bilateral pleural effusions, greater on the right. No pneumothorax. Patchy right infrahilar and lower lobe airspace disease is new or progressive. Pulmonary interstitial prominence remains. IMPRESSION: Since 03/09/2017, developing right-sided airspace disease and small bilateral pleural effusions. Favor pneumonia. Asymmetric pulmonary edema felt less likely. Borderline cardiomegaly, with transverse aortic atherosclerosis. Electronically Signed   By: Abigail Miyamoto M.D.   On: 03/15/2017 07:51    Cardiac Studies   Echo pending  Patient Profile     72M with ESRD on HD, paroxysmal atrial fibrillation, hypertension, hyperlipidemia, CVA, and COPD here with hypoxic respiratory failure and HCAP.  Assessment & Plan    Mr. Witucki had an episode of unresponsiveness while on HD 9/24.  His BP and heart rate were stable throughout the event.  He currently feels well and is without complaint.  Troponin is mildly elevated, consistent with COPD and demand ischemia. Echocardiogram is pending. We'll determine any additional necessary cardiac workup based on these findings. Continue to monitor on telemetry for now.   For questions or updates, please contact [place] Please consult [email or web address] for contact info under Cardiology/STEMI.      Signed, Skeet Latch, MD  03/16/2017, 9:22 AM

## 2017-03-16 NOTE — Progress Notes (Signed)
[facility] Progress Note   Subjective:  Seen in room. Says breathing is "better". No further apneic episodes after HD yesterday, cardiology is evaluating him. No CP or abdominal symptoms today. Denies diarrhea.   Objective Vitals:   03/16/17 0206 03/16/17 0545 03/16/17 0738 03/16/17 0915  BP:      Pulse:      Resp:      Temp:      TempSrc:      SpO2: 98% 96% 91% 93%  Weight:      Height:      ?Last BP 139/43, P 86. Unsure what is going on with this table.  Physical Exam General: Well appearing man, NAD. On nasal oxygen. Heart: RRR; no murmur Lungs: Coarse throughout without rales Abdomen: Soft, non-tender Extremities: No LE edema Dialysis Access: L AVF + thrill  Additional Objective Labs: Basic Metabolic Panel:  Recent Labs Lab 03/15/17 0714  NA 137  K 4.5  CL 96*  CO2 26  GLUCOSE 107*  BUN 65*  CREATININE 6.55*  CALCIUM 8.6*   CBC:  Recent Labs Lab 03/11/17 1052 03/15/17 0714  WBC 9.9 12.8*  NEUTROABS 8.5*  --   HGB 9.3* 8.7*  HCT 28.4* 27.4*  MCV 99.2 99.6  PLT 203.0 224   Cardiac Enzymes:  Recent Labs Lab 03/15/17 1223 03/15/17 1740 03/15/17 2358  TROPONINI 0.54* 0.54* 0.43*   Iron Studies:  Recent Labs  03/15/17 1223  IRON 39*  TIBC 230*  FERRITIN 1,075*   Studies/Results: Dg Chest 2 View  Result Date: 03/16/2017 CLINICAL DATA:  Pneumonia. Improved breathing today. History of COPD, CHF, and coronary artery disease. EXAM: CHEST  2 VIEW COMPARISON:  PA and lateral chest x-ray of March 15, 2017 FINDINGS: The lungs remain mildly hyperinflated. There has been interval improvement in the appearance of the pulmonary interstitium bilaterally. There is a small right pleural effusion and trace left pleural effusion. The cardiac silhouette is normal. The pulmonary vascularity is not engorged. There is calcification in the wall of the aortic arch. There is multilevel degenerative disc disease of the thoracic spine. IMPRESSION:  Improving interstitial infiltrates bilaterally. Small bilateral pleural effusions. No CHF. Thoracic aortic atherosclerosis. Electronically Signed   By: David  Martinique M.D.   On: 03/16/2017 07:42   Dg Chest 2 View  Result Date: 03/15/2017 CLINICAL DATA:  Cough and shortness of breath.  Weakness. EXAM: CHEST  2 VIEW COMPARISON:  03/11/2017 CT.  Plain film 03/09/2017 FINDINGS: Hyperinflation. Midline trachea. Borderline cardiomegaly. Atherosclerosis in the transverse aorta. Developing small bilateral pleural effusions, greater on the right. No pneumothorax. Patchy right infrahilar and lower lobe airspace disease is new or progressive. Pulmonary interstitial prominence remains. IMPRESSION: Since 03/09/2017, developing right-sided airspace disease and small bilateral pleural effusions. Favor pneumonia. Asymmetric pulmonary edema felt less likely. Borderline cardiomegaly, with transverse aortic atherosclerosis. Electronically Signed   By: Abigail Miyamoto M.D.   On: 03/15/2017 07:51   Medications: . sodium chloride    . sodium chloride    . [START ON 03/17/2017] ceFEPime (MAXIPIME) IV    . [START ON 03/17/2017] ferric gluconate (FERRLECIT/NULECIT) IV    . [START ON 03/17/2017] vancomycin     . albuterol  2.5 mg Nebulization QID  . aspirin EC  325 mg Oral Daily  . atorvastatin  20 mg Oral Daily  . calcium acetate  667-1,334 mg Oral TID WC  . cholecalciferol  1,000 Units Oral Daily  . darbepoetin (ARANESP) injection - DIALYSIS  100 mcg Intravenous Q Mon-HD  .  doxercalciferol  3 mcg Intravenous Q M,W,F-HD  . feeding supplement (NEPRO CARB STEADY)  237 mL Oral BID BM  . heparin  5,000 Units Subcutaneous Q8H  . mouth rinse  15 mL Mouth Rinse BID  . metoprolol succinate  50 mg Oral Daily  . midodrine  10 mg Oral Q M,W,F-HD  . mometasone-formoterol  2 puff Inhalation BID  . multivitamin  1 tablet Oral Daily  . predniSONE  5 mg Oral Q breakfast  . tiotropium  18 mcg Inhalation Daily    Dialysis Orders: MWF  at [facility] 4hr, BFR 400, DFR 800, 2K/2Ca, profile 4, AVF, heparin 2500 bolus, EDW 68.5kg - Venofer 33mcg IV weekly - Mircera 10mcg IV q 2 weeks (last 78mcg on 9/12) - Hectoral 14mcg IV q HD (last PTH 302)  Assessment/Plan: 1. Acute dyspnea (RLL HCAP v. volume overload): BCx 9/24 pending. Vanc/Cefepime started. CXR improving today. Per primary. Will continue to challenge EDW. 2. ESRD: Continue HD per MWF schedule, next 9/26. 3. HTN/volume: BP improving. On metoprolol. Continue midodrine pre-HD. Reducing EDW as tolerated. 4. Anemia: Hgb 8.7, trending down. Aranesp 150mcg given 9/24, tsat 17%. Weekly IV iron resumed, will give full course as outpt. 5. Secondary hyperparathyroidism: Ca ok. Continue Hectoral and binders (Phos[place]) 6. Nutrition: Alb low. Continue protein supps. 7. pAF: off warfarin since 07/2015 (GI bleed). Rate controlled on metoprolol. 8. CAD: Hx cardiac stents (1999). Cardiology following. Echo pending. 9. Apneic event on HD (9/24): Improved. Never lost pulse. Monitor closely.   Veneta Penton, PA-C 03/16/2017, 9:49 AM  [facility] Pager: [phone]   I have seen and examined this patient and agree with plan and assessment in the above note with renal recommendations/intervention highlighted.  He looks much better this am and awaiting Cardiac workup.   Broadus John A Livi Mcgann,MD 03/16/2017 11:48 AM

## 2017-03-16 NOTE — Progress Notes (Signed)
PROGRESS NOTE    Terry [name].  ZOX:[id number] DOB: 10/14/1934 DOA: 03/15/2017 PCP: [name], DO    Brief Narrative:  Patient admitted with pneumonia and being treated with vancomycin and cefepime. Doing better daily.   Assessment & Plan:   Principal Problem:   HCAP (healthcare-associated pneumonia) Active Problems:   Hypertension   Chest pain   High cholesterol   CKD (chronic kidney disease) stage V requiring chronic dialysis (HCC)   COPD (chronic obstructive pulmonary disease) GOLD stage III   Chronic hypoxemic respiratory failure (HCC)   PAF (paroxysmal atrial fibrillation) ([place])   HCAP (healthcare-associated pneumonia) -Continue Maxipime/vancomycin -Follow up on blood cultures; obtain sputum culture; if able to urinate obtain urinary strep -Follow up chest x-ray in a.m.-infiltrate resolved after dialysis likely noninfectious in etiology  Active Problems:   COPD (chronic obstructive pulmonary disease) GOLD stage III -Continue preadmission nebulizers as well as chronic low-dose prednisone and LABA  -Not actively wheezing and does not appear to be experiencing COPD exacerbation     Chronic hypoxemic respiratory failure  -Typically utilizes 1 L oxygen at HS and prn during the daytime if needed  - wean O2    Hypertension -Current blood pressure control  -Continue Toprol  -No ACE inhibitor secondary to cough     Chest pain -Patient reports has been experiencing exertional chest pain for at least 2-3 weeks  -Has history of 2 stents placed for a 95% blockage in 1999 (RCA)-[place] -ECHO results pending -Consult cardiology -Continue beta blocker, statin and aspirin -Current troponins mildly elevated -continue to cycle     CKD (chronic kidney disease) stage V requiring chronic dialysis  -MWF schedule -Nephrology consulted -Patient reports that typical dry weight 152 pounds and most recent weight 156 pounds-?? Respiratory symptoms  secondary to volume overload    High cholesterol -Continue statin     PAF (paroxysmal atrial fibrillation)  -Previously deemed not a candidate for anticoagulation  -Currently maintaining sinus rhythm      DVT prophylaxis: subcutaneous heparin   Code Status:  full Family Communication: wife   Disposition Plan: home  Consults called: Nephrology/[place]; Cardiology/[facility]   Consultants:   Cardio and Nephro   Procedures: n/A   Antimicrobials:   Vancomycin cefepime-start date 9/25   Subjective: No acute issues overnight. Patient is comfortable. Denies pain.  Objective: Vitals:   03/16/17 0738 03/16/17 0915 03/16/17 1123 03/16/17 1136  BP:   (!) 121/35   Pulse:      Resp:      Temp:      TempSrc:      SpO2: 91% 93%  (!) 84%  Weight:      Height:        Intake/Output Summary (Last 24 hours) at 03/16/17 1558 Last data filed at 03/16/17 0620  Gross per 24 hour  Intake              120 ml  Output             2500 ml  Net            -2380 ml   Filed Weights   03/15/17 1740 03/15/17 2138  Weight: 67.9 kg (149 lb 9.6 oz) 67.9 kg (149 lb 11.1 oz)    Examination:  General exam: Appears calm and comfortable; A&Ox3 Respiratory system: Basilar crackles. Respiratory effort normal. Cardiovascular system: S1 & S2 heard, RRR. No JVD, murmurs, rubs, gallops or clicks. No pedal edema. Gastrointestinal system: Abdomen is nondistended, soft  and nontender. No organomegaly or masses felt. Normal bowel sounds heard. Central nervous system: Alert and oriented. No focal neurological deficits. Extremities: Symmetric 5 x 5 power. Skin: No rashes, lesions or ulcers Psychiatry: Judgement and insight appear normal. Mood & affect appropriate.     Data Reviewed: I have personally reviewed following labs and imaging studies  CBC:  Recent Labs Lab 03/11/17 1052 03/15/17 0714  WBC 9.9 12.8*  NEUTROABS 8.5*  --   HGB 9.3* 8.7*  HCT 28.4* 27.4*  MCV 99.2 99.6  PLT 203.0  474   Basic Metabolic Panel:  Recent Labs Lab 03/15/17 0714  NA 137  K 4.5  CL 96*  CO2 26  GLUCOSE 107*  BUN 65*  CREATININE 6.55*  CALCIUM 8.6*   GFR: Estimated Creatinine Clearance: 8.8 mL/min (A) (by C-G formula based on SCr of 6.55 mg/dL (H)). Liver Function Tests: No results for input(s): AST, ALT, ALKPHOS, BILITOT, PROT, ALBUMIN in the last 168 hours. No results for input(s): LIPASE, AMYLASE in the last 168 hours. No results for input(s): AMMONIA in the last 168 hours. Coagulation Profile: No results for input(s): INR, PROTIME in the last 168 hours. Cardiac Enzymes:  Recent Labs Lab 03/15/17 1223 03/15/17 1740 03/15/17 2358  TROPONINI 0.54* 0.54* 0.43*   BNP (last 3 results) No results for input(s): PROBNP in the last 8760 hours. HbA1C: No results for input(s): HGBA1C in the last 72 hours. CBG: No results for input(s): GLUCAP in the last 168 hours. Lipid Profile: No results for input(s): CHOL, HDL, LDLCALC, TRIG, CHOLHDL, LDLDIRECT in the last 72 hours. Thyroid Function Tests: No results for input(s): TSH, T4TOTAL, FREET4, T3FREE, THYROIDAB in the last 72 hours. Anemia Panel:  Recent Labs  03/15/17 1223  FERRITIN 1,075*  TIBC 230*  IRON 39*   Sepsis Labs:  Recent Labs Lab 03/15/17 1223  PROCALCITON 0.25    Recent Results (from the past 240 hour(s))  Culture, blood (routine x 2) Call MD if unable to obtain prior to antibiotics being given     Status: None (Preliminary result)   Collection Time: 03/15/17  9:06 AM  Result Value Ref Range Status   Specimen Description BLOOD LEFT ANTECUBITAL  Final   Special Requests   Final    BOTTLES DRAWN AEROBIC AND ANAEROBIC Blood Culture adequate volume   Culture NO GROWTH 1 DAY  Final   Report Status PENDING  Incomplete  Culture, blood (routine x 2) Call MD if unable to obtain prior to antibiotics being given     Status: None (Preliminary result)   Collection Time: 03/15/17  9:20 AM  Result Value Ref  Range Status   Specimen Description BLOOD LEFT UPPER ARM  Final   Special Requests   Final    BOTTLES DRAWN AEROBIC AND ANAEROBIC Blood Culture adequate volume   Culture NO GROWTH 1 DAY  Final   Report Status PENDING  Incomplete  MRSA PCR Screening     Status: Abnormal   Collection Time: 03/15/17 10:54 PM  Result Value Ref Range Status   MRSA by PCR POSITIVE (A) NEGATIVE Final    Comment:        The GeneXpert MRSA Assay (FDA approved for NASAL specimens only), is one component of a comprehensive MRSA colonization surveillance program. It is not intended to diagnose MRSA infection nor to guide or monitor treatment for MRSA infections. RESULT CALLED TO, READ BACK BY AND VERIFIED WITH: Hanley Ben RN 10:05 03/16/17 ([email or web address])  Radiology Studies: Dg Chest 2 View  Result Date: 03/16/2017 CLINICAL DATA:  Pneumonia. Improved breathing today. History of COPD, CHF, and coronary artery disease. EXAM: CHEST  2 VIEW COMPARISON:  PA and lateral chest x-ray of March 15, 2017 FINDINGS: The lungs remain mildly hyperinflated. There has been interval improvement in the appearance of the pulmonary interstitium bilaterally. There is a small right pleural effusion and trace left pleural effusion. The cardiac silhouette is normal. The pulmonary vascularity is not engorged. There is calcification in the wall of the aortic arch. There is multilevel degenerative disc disease of the thoracic spine. IMPRESSION: Improving interstitial infiltrates bilaterally. Small bilateral pleural effusions. No CHF. Thoracic aortic atherosclerosis. Electronically Signed   By: David  Martinique M.D.   On: 03/16/2017 07:42   Dg Chest 2 View  Result Date: 03/15/2017 CLINICAL DATA:  Cough and shortness of breath.  Weakness. EXAM: CHEST  2 VIEW COMPARISON:  03/11/2017 CT.  Plain film 03/09/2017 FINDINGS: Hyperinflation. Midline trachea. Borderline cardiomegaly. Atherosclerosis in the transverse aorta. Developing small  bilateral pleural effusions, greater on the right. No pneumothorax. Patchy right infrahilar and lower lobe airspace disease is new or progressive. Pulmonary interstitial prominence remains. IMPRESSION: Since 03/09/2017, developing right-sided airspace disease and small bilateral pleural effusions. Favor pneumonia. Asymmetric pulmonary edema felt less likely. Borderline cardiomegaly, with transverse aortic atherosclerosis. Electronically Signed   By: Abigail Miyamoto M.D.   On: 03/15/2017 07:51        Scheduled Meds: . albuterol  2.5 mg Nebulization Q6H  . aspirin EC  325 mg Oral Daily  . atorvastatin  20 mg Oral Daily  . calcium acetate  667-1,334 mg Oral TID WC  . cholecalciferol  1,000 Units Oral Daily  . darbepoetin (ARANESP) injection - DIALYSIS  100 mcg Intravenous Q Mon-HD  . doxercalciferol  3 mcg Intravenous Q M,W,F-HD  . feeding supplement (NEPRO CARB STEADY)  237 mL Oral BID BM  . guaiFENesin  600 mg Oral BID  . heparin  5,000 Units Subcutaneous Q8H  . mouth rinse  15 mL Mouth Rinse BID  . metoprolol succinate  50 mg Oral Daily  . midodrine  10 mg Oral Q M,W,F-HD  . mometasone-formoterol  2 puff Inhalation BID  . [START ON 03/17/2017] multivitamin  1 tablet Oral QHS  . predniSONE  5 mg Oral Q breakfast  . tiotropium  18 mcg Inhalation Daily   Continuous Infusions: . sodium chloride    . sodium chloride    . ceFEPime (MAXIPIME) IV    . [START ON 03/17/2017] ceFEPime (MAXIPIME) IV    . [START ON 03/17/2017] ferric gluconate (FERRLECIT/NULECIT) IV    . [START ON 03/17/2017] vancomycin       LOS: 0 days    Time spent: 61min    Elwin Mocha, MD [facility] Pager AMION  If 7PM-7AM, please contact night-coverage [email or web address] Password [facility] 03/16/2017, 3:58 PM

## 2017-03-16 NOTE — Progress Notes (Signed)
Pharmacy Antibiotic Note  Terry [name]. is a 82 y.o. male admitted on 03/15/2017 with pneumonia.  Pt with ESRD on HD.  Pharmacy consulted for vancomycin and cefepime. Vancomycin 1500 mg IV x1 loading dose given yesterday morning.  Patient then went to Hemodialysis.  HD completed x4h, BFR 400+ yesterday. Patient had last dose of cefepime 2g IV at 0930 on 9/24 prior to HD, so the patient will need additional dose prior to next HD tomorrow.  Plan: Cefepime 1000mg  IV x1 dose today Vancomycin 750mg  IV qHD MWF, starting 03/17/17.  F/u renal plans, C&S, clinical status and pre-HD vanc level when appropriate  Height: 5\' 8"  (172.7 cm) Weight: 149 lb 11.1 oz (67.9 kg) IBW/kg (Calculated) : 68.4  Temp (24hrs), Avg:98 F (36.7 C), Min:97.6 F (36.4 C), Max:98.3 F (36.8 C)   Recent Labs Lab 03/11/17 1052 03/15/17 0714  WBC 9.9 12.8*  CREATININE  --  6.55*    Estimated Creatinine Clearance: 8.8 mL/min (A) (by C-G formula based on SCr of 6.55 mg/dL (H)).    Allergies  Allergen Reactions  . Yellow Dyes (Non-Tartrazine) Other (See Comments)    Burns arms Cough medications (tussionex and benzonatate ok)  . Levaquin [Levofloxacin In D5w] Itching  . Tussionex Pennkinetic Er [Hydrocod Polst-Cpm Polst Er] Other (See Comments)    Unknown on MAR    Antimicrobials this admission: Vanc 9/24>> Cefepime 9/24>>  Dose adjustments this admission: N/A  Microbiology results: 9/24 MRSA PCR positive 9/24 BCx ngtd  Nida Boatman, PharmD PGY1 Acute Care Pharmacy Resident Pager: [phone] 03/16/2017 2:55 PM

## 2017-03-16 NOTE — Care Management Obs Status (Signed)
[place] NOTIFICATION   Patient Details  Name: Terry [name]. MRN: [id number] Date of Birth: 1935-03-12   Medicare Observation Status Notification Given:  Yes    Erenest Rasher, RN 03/16/2017, 10:55 AM

## 2017-03-16 NOTE — Progress Notes (Signed)
Ophthalmology Initial Consult Note  Terry Garfinkel., 82 y.o. male Date of Service:  03/16/2017  Requesting physician: Elwin Mocha, MD  Information Obtained from: patient Chief Complaint:  Left eye irritation  HPI/Discussion:  Terry Bartoli. is a 82 y.o. male who presented to [facility] for evaluation/treatment of pneumonia. He is s/p cataract surgery OU and recent vitrectomy OS. His wife was concerned about eye pain OS. He had briefly had an elevated IOP after his most recent surgery (~6+ weeks ago now), and she was concerned that the IOP could be elevated. He reports irritation primarily in the morning. He has no vision changes.  Past Ocular Hx:  CE/IOL OU, pars plana vitrectomy OS Ocular Meds:  None, previously latanoprost OS Family ocular history: Noncontributory  Past Medical History:  Diagnosis Date  . Acute and chronic respiratory failure 10/22/2012  . Anxiety   . Arthritis    "hands, knees" (03/15/2017)  . Atrial fibrillation ([place])   . CAD (coronary artery disease) [place], [name]  . CHF (congestive heart failure) ([place]) 09/2012  . COPD (chronic obstructive pulmonary disease) ([place])   . ESRD (end stage renal disease) on dialysis [facility])    "MWF; [facility] [place]" (03/15/2017)  . Flu 09/24/2016   influenza b  . HCAP (healthcare-associated pneumonia) 03/15/2017  . High cholesterol   . History of blood transfusion 2012   "related to bowel resection"  . Hypertension   . On home oxygen therapy    "1L typically at night" (03/15/2017)  . Osteoarthritis 10/22/2012  . Pneumonia April 2014; 09/2016  . Shortness of breath dyspnea   . Stroke [facility]) 2012   denies residual on 03/15/2017  . Stroke [facility]) ~ 2013   "small one in his right eye"   Past Surgical History:  Procedure Laterality Date  . APPENDECTOMY  1984  . AV FISTULA PLACEMENT Right 05/21/2015   Procedure: Right Arm Brachiocephalic ARTERIOVENOUS (AV) FISTULA CREATION;  Surgeon: Angelia Mould, MD;   Location: [place];  Service: Vascular;  Laterality: Right;  . CATARACT EXTRACTION W/ INTRAOCULAR LENS  IMPLANT, BILATERAL Bilateral   . COLECTOMY  2012   partial colon removed - twisted bowel  . CORONARY ANGIOPLASTY WITH STENT PLACEMENT  1999   2 stents  . CYSTOSCOPY/RETROGRADE/URETEROSCOPY Bilateral 04/23/2014   Procedure: CYSTOSCOPY BILATERAL RETROGRADE,  LEFT URETEROSCOPY, LEFT STENT PLACEMENT;  Surgeon: Raynelle Bring, MD;  Location: [facility];  Service: Urology;  Laterality: Bilateral;  . EYE SURGERY Left    "surgery to clean off lens after cataract OR"  . INGUINAL HERNIA REPAIR Right     Prior to Admission Meds: Prescriptions Prior to Admission  Medication Sig Dispense Refill Last Dose  . aspirin EC 325 MG tablet Take 1 tablet (325 mg total) by mouth daily. 30 tablet 0 Past Week at Unknown time  . atorvastatin (LIPITOR) 20 MG tablet Take 1 tablet (20 mg total) by mouth daily. 90 tablet 2 Past Week at Unknown time  . calcium acetate (PHOSLO) 667 MG capsule Take 667-1,334 mg by mouth 3 (three) times daily with meals.    Past Week at Unknown time  . cholecalciferol (VITAMIN D) 1000 units tablet Take 1,000 Units by mouth daily.   Past Week at Unknown time  . guaiFENesin (MUCINEX) 600 MG 12 hr tablet Take 600 mg by mouth 2 (two) times daily as needed for cough.    Past Week at Unknown time  . ipratropium-albuterol (DUONEB) 0.5-2.5 (3) MG/3ML SOLN Take 3  mLs by nebulization every 4 (four) hours as needed (for breathing).    03/15/2017 at Unknown time  . methocarbamol (ROBAXIN) 500 MG tablet Take 500 mg by mouth 2 (two) times daily as needed for muscle spasms.   Past Week at Unknown time  . metoprolol succinate (TOPROL-XL) 50 MG 24 hr tablet Take 50 mg by mouth 2 (two) times daily. Take with or immediately following a meal.    Past Week at Unknown time  . midodrine (PROAMATINE) 10 MG tablet Take 10 mg by mouth as needed. At dialysis for low pressure   Past Month at Unknown time  . multivitamin  (RENA-VIT) TABS tablet Take 1 tablet by mouth daily.   Past Month at Unknown time  . OXYGEN Inhale 1 L into the lungs at bedtime.    Past Week at Unknown time  . predniSONE (DELTASONE) 10 MG tablet Take 0.5 tablets (5 mg total) by mouth daily with breakfast. Take 1/2 tablet by mouth daily (Patient taking differently: Take 10 mg by mouth daily with breakfast. ) 30 tablet 2 Past Week at Unknown time  . SYMBICORT 160-4.5 MCG/ACT inhaler INHALE 2 PUFFS TWICE DAILY 3 Inhaler 3 Past Week at Unknown time  . tiotropium (SPIRIVA HANDIHALER) 18 MCG inhalation capsule One capsule each am (Patient taking differently: Place 18 mcg into inhaler and inhale every morning. ) 30 capsule 11 Past Week at Unknown time    Inpatient Meds: @IPMEDS @  Allergies  Allergen Reactions  . Yellow Dyes (Non-Tartrazine) Other (See Comments)    Burns arms Cough medications (tussionex and benzonatate ok)  . Levaquin [Levofloxacin In D5w] Itching  . Tussionex Pennkinetic Er [Hydrocod Polst-Cpm Polst Er] Other (See Comments)    Unknown on MAR   Social History  Substance Use Topics  . Smoking status: Former Smoker    Packs/day: 1.00    Years: 49.00    Types: Cigarettes, Pipe, Cigars    Quit date: 02/21/2000  . Smokeless tobacco: Former Systems developer    Types: Chew     Comment: "chewed when he was a kid"  . Alcohol use Yes     Comment: 03/15/2017 "nothing since the 1960's"   Family History  Problem Relation Age of Onset  . Cancer Mother        Breast  . Heart disease Father   . Heart attack Father   . Parkinson's disease Brother     ROS: Other than ROS in the HPI, all other systems were negative.  Exam: Temp: 98 F (36.7 C) Pulse Rate: 76 BP: (!) 152/49 Resp: 20 SpO2: 93 %  Visual Acuity:  [place]   OD 20/40 near   OS 20/40 near  Near glasses not available    OD OS  Confr Vis Fields Full Full  EOM (Primary) Full Full  Lids/Lashes Normal Normal  Conjunctiva - Bulbar White, quiet White, quiet  Conjunctiva -  Palpebral               White, quiet White, quiet  Adnexa  Normal Normal  Pupils  3 --> 2, no rAPD 3 --> 2, no rAPD  Cornea  Clear Clear, no abrasion  Anterior Chamber Formed, grossly quiet Formed, grossly quiet  Lens:  IOL IOL  IOP (tonopen) 20 20  Fundus - Dilated? Not examined    Neuro:  Oriented to person, place, and time:  Yes Psychiatric:  Mood and Affect Appropriate:  Yes  Labs/imaging: NA  A/P:  82 y.o. male with eye discomfort OS  1) Dry eyes s/p recent pars plana vitrectomy OS - IOP is not elevated. - There is no corneal abrasion. - There is no obvious anterior chamber inflammation, although difficult to assess at bedside - no photophobia. - Recommend artificial tears QID. Okay from my perspective if patient administers his own drops, but I will go ahead and place order in Epic.  I will see the patient in my office in a few weeks. He already has an appointment set up for continued routine eyecare. If there are any additional concerns that need to be addressed while in the hospital, please let me know.  Doran Stabler, MD 03/16/2017, 6:45 PM

## 2017-03-16 NOTE — Progress Notes (Signed)
  Echocardiogram 2D Echocardiogram has been performed.  Bobbye Charleston 03/16/2017, 2:28 PM

## 2017-03-16 NOTE — Care Management Note (Signed)
Case Management Note  Patient Details  Name: Bay Wayson. MRN: [id number] Date of Birth: 01-03-1935  Subjective/Objective:    Acute dyspnea RLL HCAP vs volume overload, ESRD on HD, HTN                Action/Plan: Discharge Planning: NCM spoke to pt and wife at bedside. Wife reports pt has neb machine, RW, bedside commode and oxygen ([facility]) at home. Will continue to follow for dc needs. Requesting to see Chaplain for Advance Directives.   PCP [name]   Expected Discharge Date:                  Expected Discharge Plan:  Home/Self Care  In-House Referral:  NA  Discharge planning Services  CM Consult  Post Acute Care Choice:  NA Choice offered to:  NA     Status of Service:  In process, will continue to follow  If discussed at Long Length of Stay Meetings, dates discussed:    Additional Comments:  Erenest Rasher, RN 03/16/2017, 10:55 AM

## 2017-03-17 DIAGNOSIS — E785 Hyperlipidemia, unspecified: Secondary | ICD-10-CM | POA: Diagnosis present

## 2017-03-17 DIAGNOSIS — J439 Emphysema, unspecified: Secondary | ICD-10-CM | POA: Diagnosis present

## 2017-03-17 DIAGNOSIS — D638 Anemia in other chronic diseases classified elsewhere: Secondary | ICD-10-CM | POA: Diagnosis present

## 2017-03-17 DIAGNOSIS — I2510 Atherosclerotic heart disease of native coronary artery without angina pectoris: Secondary | ICD-10-CM | POA: Diagnosis present

## 2017-03-17 DIAGNOSIS — Y95 Nosocomial condition: Secondary | ICD-10-CM | POA: Diagnosis present

## 2017-03-17 DIAGNOSIS — J9620 Acute and chronic respiratory failure, unspecified whether with hypoxia or hypercapnia: Secondary | ICD-10-CM | POA: Diagnosis present

## 2017-03-17 DIAGNOSIS — N2581 Secondary hyperparathyroidism of renal origin: Secondary | ICD-10-CM | POA: Diagnosis present

## 2017-03-17 DIAGNOSIS — I480 Paroxysmal atrial fibrillation: Secondary | ICD-10-CM | POA: Diagnosis present

## 2017-03-17 DIAGNOSIS — Z820 Family history of epilepsy and other diseases of the nervous system: Secondary | ICD-10-CM | POA: Diagnosis not present

## 2017-03-17 DIAGNOSIS — E7800 Pure hypercholesterolemia, unspecified: Secondary | ICD-10-CM | POA: Diagnosis present

## 2017-03-17 DIAGNOSIS — J189 Pneumonia, unspecified organism: Secondary | ICD-10-CM | POA: Diagnosis present

## 2017-03-17 DIAGNOSIS — Z803 Family history of malignant neoplasm of breast: Secondary | ICD-10-CM | POA: Diagnosis not present

## 2017-03-17 DIAGNOSIS — I248 Other forms of acute ischemic heart disease: Secondary | ICD-10-CM | POA: Diagnosis present

## 2017-03-17 DIAGNOSIS — Z8673 Personal history of transient ischemic attack (TIA), and cerebral infarction without residual deficits: Secondary | ICD-10-CM | POA: Diagnosis not present

## 2017-03-17 DIAGNOSIS — J9621 Acute and chronic respiratory failure with hypoxia: Secondary | ICD-10-CM | POA: Diagnosis present

## 2017-03-17 DIAGNOSIS — Z8249 Family history of ischemic heart disease and other diseases of the circulatory system: Secondary | ICD-10-CM | POA: Diagnosis not present

## 2017-03-17 DIAGNOSIS — I482 Chronic atrial fibrillation: Secondary | ICD-10-CM | POA: Diagnosis present

## 2017-03-17 DIAGNOSIS — Z992 Dependence on renal dialysis: Secondary | ICD-10-CM | POA: Diagnosis not present

## 2017-03-17 DIAGNOSIS — I10 Essential (primary) hypertension: Secondary | ICD-10-CM | POA: Diagnosis not present

## 2017-03-17 DIAGNOSIS — Z7952 Long term (current) use of systemic steroids: Secondary | ICD-10-CM | POA: Diagnosis not present

## 2017-03-17 DIAGNOSIS — I132 Hypertensive heart and chronic kidney disease with heart failure and with stage 5 chronic kidney disease, or end stage renal disease: Secondary | ICD-10-CM | POA: Diagnosis present

## 2017-03-17 DIAGNOSIS — M1990 Unspecified osteoarthritis, unspecified site: Secondary | ICD-10-CM | POA: Diagnosis present

## 2017-03-17 DIAGNOSIS — Z955 Presence of coronary angioplasty implant and graft: Secondary | ICD-10-CM | POA: Diagnosis not present

## 2017-03-17 DIAGNOSIS — Z87891 Personal history of nicotine dependence: Secondary | ICD-10-CM | POA: Diagnosis not present

## 2017-03-17 DIAGNOSIS — Z7982 Long term (current) use of aspirin: Secondary | ICD-10-CM | POA: Diagnosis not present

## 2017-03-17 DIAGNOSIS — N186 End stage renal disease: Secondary | ICD-10-CM | POA: Diagnosis present

## 2017-03-17 LAB — CBC WITH DIFFERENTIAL/PLATELET
BASOS ABS: 0.1 10*3/uL (ref 0.0–0.1)
BASOS PCT: 1 %
Eosinophils Absolute: 0.2 10*3/uL (ref 0.0–0.7)
Eosinophils Relative: 3 %
HEMATOCRIT: 22.7 % — AB (ref 39.0–52.0)
Hemoglobin: 7.3 g/dL — ABNORMAL LOW (ref 13.0–17.0)
Lymphocytes Relative: 18 %
Lymphs Abs: 1.4 10*3/uL (ref 0.7–4.0)
MCH: 31.7 pg (ref 26.0–34.0)
MCHC: 32.2 g/dL (ref 30.0–36.0)
MCV: 98.7 fL (ref 78.0–100.0)
MONO ABS: 0.9 10*3/uL (ref 0.1–1.0)
Monocytes Relative: 11 %
NEUTROS ABS: 5.4 10*3/uL (ref 1.7–7.7)
NEUTROS PCT: 67 %
Platelets: 182 10*3/uL (ref 150–400)
RBC: 2.3 MIL/uL — ABNORMAL LOW (ref 4.22–5.81)
RDW: 18.4 % — AB (ref 11.5–15.5)
WBC: 7.9 10*3/uL (ref 4.0–10.5)

## 2017-03-17 LAB — BASIC METABOLIC PANEL
ANION GAP: 13 (ref 5–15)
BUN: 46 mg/dL — ABNORMAL HIGH (ref 6–20)
CALCIUM: 8.4 mg/dL — AB (ref 8.9–10.3)
CO2: 27 mmol/L (ref 22–32)
Chloride: 96 mmol/L — ABNORMAL LOW (ref 101–111)
Creatinine, Ser: 6.01 mg/dL — ABNORMAL HIGH (ref 0.61–1.24)
GFR, EST AFRICAN AMERICAN: 9 mL/min — AB (ref >60)
GFR, EST NON AFRICAN AMERICAN: 8 mL/min — AB (ref >60)
GLUCOSE: 96 mg/dL (ref 65–99)
Potassium: 3.5 mmol/L (ref 3.5–5.1)
Sodium: 136 mmol/L (ref 135–145)

## 2017-03-17 LAB — HEMOGLOBIN AND HEMATOCRIT, BLOOD
HCT: 27.8 % — ABNORMAL LOW (ref 39.0–52.0)
HEMATOCRIT: 25.9 % — AB (ref 39.0–52.0)
HEMOGLOBIN: 8.2 g/dL — AB (ref 13.0–17.0)
HEMOGLOBIN: 8.9 g/dL — AB (ref 13.0–17.0)

## 2017-03-17 LAB — PROCALCITONIN: PROCALCITONIN: 0.78 ng/mL

## 2017-03-17 MED ORDER — VANCOMYCIN HCL IN DEXTROSE 750-5 MG/150ML-% IV SOLN
INTRAVENOUS | Status: AC
  Administered 2017-03-17: 750 mg via INTRAVENOUS
  Filled 2017-03-17: qty 150

## 2017-03-17 MED ORDER — HYPROMELLOSE (GONIOSCOPIC) 2.5 % OP SOLN: 1.0000 [drp] | Freq: Four times a day (QID) | OPHTHALMIC | Status: DC

## 2017-03-17 MED ORDER — MUPIROCIN 2 % EX OINT
1.0000 "application " | TOPICAL_OINTMENT | Freq: Two times a day (BID) | CUTANEOUS | Status: DC
  Administered 2017-03-17 – 2017-03-18 (×3): 1 via NASAL
  Filled 2017-03-17 (×2): qty 22

## 2017-03-17 MED ORDER — SODIUM CHLORIDE 0.9 % IV SOLN
125.0000 mg | INTRAVENOUS | Status: DC
  Filled 2017-03-17: qty 10

## 2017-03-17 MED ORDER — CHLORHEXIDINE GLUCONATE CLOTH 2 % EX PADS
6.0000 | MEDICATED_PAD | Freq: Every day | CUTANEOUS | Status: DC
  Administered 2017-03-18: 6 via TOPICAL

## 2017-03-17 MED ORDER — POLYVINYL ALCOHOL 1.4 % OP SOLN
1.0000 [drp] | Freq: Four times a day (QID) | OPHTHALMIC | Status: DC
  Administered 2017-03-17 – 2017-03-18 (×3): 1 [drp] via OPHTHALMIC
  Filled 2017-03-17: qty 15

## 2017-03-17 MED ORDER — DOXERCALCIFEROL 4 MCG/2ML IV SOLN
INTRAVENOUS | Status: AC
  Administered 2017-03-17: 3 ug via INTRAVENOUS
  Filled 2017-03-17: qty 2

## 2017-03-17 NOTE — Progress Notes (Signed)
Chaplain stopped by patient room in order to get Advance Directive signed but patient was in dialysis.

## 2017-03-17 NOTE — Progress Notes (Signed)
[facility] Progress Note   Dialysis Orders: [facility] MWF 4 hr 400/800 2K 2 Ca profile 4 left upper AVF heparin 2500 venofer 50 q Wed Hectorol 3 Mircera 60 - last got 50 on 9/12 and prior to that 100 in August EDW 68.5  Recent labs: hgb dropping - was 10.1 8/22 8.9 9/19 12%  iPTH 302 Ca/P ok hgb A1c 5.8   Assessment/Plan: 1. RLL HCAP in [place]inet with severe COPD with some degree of fluid - overload - s/p culture and empiric antibiotics; repeat CXR 9/25 showed improving interstitial infiltrates bilaterally. Small pleural effusions. No CHF 2. ESRD -  MWF - HD today K 3.5 - will need 4 K bath 3. Hypertension/volume  - MTP XL + midodrine for BP support net UF 2.5 L 924 with post wt 67.4 - will have slightly lower edw for d/c  4. Anemia  -hgb trending down now 7.3  -? Related to ESA and/or Fe deficit - had been on higher dose Mircera in August 100 or more - tsat in August 12% had Fe 100 x 5 replettion -tsat 17% -ferritin 1075- Aranesp ^ to 100 9/24; will increase IV Fe and check hemocult 5. Metabolic bone disease -  Continue VDRA/binders 6. Nutrition - needs renal carb mod/vitamin 7. PAF-off coumadin since 07/2015- on MTP 8. CAD - hx stents in 1999 - cards following  Myriam Jacobson, PA-C [place] [phone] 03/17/2017,9:20 AM  LOS: 0 days   Subjective:   Feels much better today. Eating breakfast.  Declined am dialysis so he could eat breakfast.  Objective Vitals:   03/16/17 2111 03/17/17 0229 03/17/17 0645 03/17/17 0806  BP: (!) 149/43  (!) 142/71   Pulse: 72  84 82  Resp: 19  19 20   Temp: 98.4 F (36.9 C)  98.4 F (36.9 C)   TempSrc: Oral  Oral   SpO2: 97% 99% 98% 97%  Weight: 67.4 kg (148 lb 9.4 oz)     Height:       Physical Exam General: NAD sitting on side of bed eating.  Heart: irreg irreg Lungs:dim BS poor exchange.expansion - on O2 breathing comfortable Abdomen: soft NT Extremities: no LE edema Dialysis Access: left upper AVF +  bruit   Additional Objective Labs: Lab Results  Component Value Date   INR 0.97 12/28/2016   INR 1.05 07/28/2015   INR 1.11 86/76/7209    Basic Metabolic Panel:  Recent Labs Lab 03/15/17 0714 03/17/17 0606  NA 137 136  K 4.5 3.5  CL 96* 96*  CO2 26 27  GLUCOSE 107* 96  BUN 65* 46*  CREATININE 6.55* 6.01*  CALCIUM 8.6* 8.4*   CBC:  Recent Labs Lab 03/11/17 1052 03/15/17 0714 03/17/17 0606  WBC 9.9 12.8* 7.9  NEUTROABS 8.5*  --  5.4  HGB 9.3* 8.7* 7.3*  HCT 28.4* 27.4* 22.7*  MCV 99.2 99.6 98.7  PLT 203.0 224 182   Blood Culture    Component Value Date/Time   SDES BLOOD LEFT UPPER ARM 03/15/2017 0920   SPECREQUEST  03/15/2017 0920    BOTTLES DRAWN AEROBIC AND ANAEROBIC Blood Culture adequate volume   CULT NO GROWTH 1 DAY 03/15/2017 0920   REPTSTATUS PENDING 03/15/2017 0920    Cardiac Enzymes:  Recent Labs Lab 03/15/17 1223 03/15/17 1740 03/15/17 2358  TROPONINI 0.54* 0.54* 0.43*    Recent Labs  03/15/17 1223  IRON 39*  TIBC 230*  FERRITIN 1,075*   Lab Results  Component Value Date  INR 0.97 12/28/2016   INR 1.05 07/28/2015   INR 1.11 06/30/2015   Studies/Results: Dg Chest 2 View  Result Date: 03/16/2017 CLINICAL DATA:  Pneumonia. Improved breathing today. History of COPD, CHF, and coronary artery disease. EXAM: CHEST  2 VIEW COMPARISON:  PA and lateral chest x-ray of March 15, 2017 FINDINGS: The lungs remain mildly hyperinflated. There has been interval improvement in the appearance of the pulmonary interstitium bilaterally. There is a small right pleural effusion and trace left pleural effusion. The cardiac silhouette is normal. The pulmonary vascularity is not engorged. There is calcification in the wall of the aortic arch. There is multilevel degenerative disc disease of the thoracic spine. IMPRESSION: Improving interstitial infiltrates bilaterally. Small bilateral pleural effusions. No CHF. Thoracic aortic atherosclerosis.  Electronically Signed   By: David  Martinique M.D.   On: 03/16/2017 07:42   Medications: . sodium chloride    . sodium chloride    . ceFEPime (MAXIPIME) IV    . ferric gluconate (FERRLECIT/NULECIT) IV    . vancomycin     . albuterol  2.5 mg Nebulization Q6H  . aspirin EC  325 mg Oral Daily  . atorvastatin  20 mg Oral Daily  . calcium acetate  667-1,334 mg Oral TID WC  . cholecalciferol  1,000 Units Oral Daily  . darbepoetin (ARANESP) injection - DIALYSIS  100 mcg Intravenous Q Mon-HD  . doxercalciferol  3 mcg Intravenous Q M,W,F-HD  . feeding supplement (NEPRO CARB STEADY)  237 mL Oral BID BM  . guaiFENesin  600 mg Oral BID  . heparin  5,000 Units Subcutaneous Q8H  . mouth rinse  15 mL Mouth Rinse BID  . metoprolol succinate  50 mg Oral Daily  . midodrine  10 mg Oral Q M,W,F-HD  . mometasone-formoterol  2 puff Inhalation BID  . multivitamin  1 tablet Oral QHS  . polyvinyl alcohol  1 drop Both Eyes QID  . predniSONE  5 mg Oral Q breakfast  . tiotropium  18 mcg Inhalation Daily    I have seen and examined this patient and agree with plan and assessment in the above note with renal recommendations/intervention highlighted.  He feels much better.  No further cardiac workup per Dr. Oval Linsey.  Stable for discharge to home from renal standpoint with a new lower edw. [place] John A Earsie Humm,MD 03/17/2017 2:12 PM

## 2017-03-17 NOTE — Progress Notes (Signed)
Progress Note  Patient Name: Terry [name]. Date of Encounter: 03/17/2017  Primary Cardiologist: Dr. Julianne Handler  Subjective   Feeling well. Breathing is much better. No chest pain, lightheadedness or dizziness.  Inpatient Medications    Scheduled Meds: . albuterol  2.5 mg Nebulization Q6H  . aspirin EC  325 mg Oral Daily  . atorvastatin  20 mg Oral Daily  . calcium acetate  667-1,334 mg Oral TID WC  . cholecalciferol  1,000 Units Oral Daily  . darbepoetin (ARANESP) injection - DIALYSIS  100 mcg Intravenous Q Mon-HD  . doxercalciferol  3 mcg Intravenous Q M,W,F-HD  . feeding supplement (NEPRO CARB STEADY)  237 mL Oral BID BM  . guaiFENesin  600 mg Oral BID  . heparin  5,000 Units Subcutaneous Q8H  . mouth rinse  15 mL Mouth Rinse BID  . metoprolol succinate  50 mg Oral Daily  . midodrine  10 mg Oral Q M,W,F-HD  . mometasone-formoterol  2 puff Inhalation BID  . multivitamin  1 tablet Oral QHS  . polyvinyl alcohol  1 drop Both Eyes QID  . predniSONE  5 mg Oral Q breakfast  . tiotropium  18 mcg Inhalation Daily   Continuous Infusions: . sodium chloride    . sodium chloride    . ceFEPime (MAXIPIME) IV    . ferric gluconate (FERRLECIT/NULECIT) IV    . vancomycin     PRN Meds: sodium chloride, sodium chloride, albuterol, alteplase, heparin, heparin, lidocaine (PF), lidocaine-prilocaine, methocarbamol, pentafluoroprop-tetrafluoroeth, polyvinyl alcohol   Vital Signs    Vitals:   03/16/17 2111 03/17/17 0229 03/17/17 0645 03/17/17 0806  BP: (!) 149/43  (!) 142/71   Pulse: 72  84 82  Resp: 19  19 20   Temp: 98.4 F (36.9 C)  98.4 F (36.9 C)   TempSrc: Oral  Oral   SpO2: 97% 99% 98% 97%  Weight: 67.4 kg (148 lb 9.4 oz)     Height:        Intake/Output Summary (Last 24 hours) at 03/17/17 0943 Last data filed at 03/17/17 0601  Gross per 24 hour  Intake              890 ml  Output                0 ml  Net              890 ml   Filed Weights   03/15/17 1740  03/15/17 2138 03/16/17 2111  Weight: 67.9 kg (149 lb 9.6 oz) 67.9 kg (149 lb 11.1 oz) 67.4 kg (148 lb 9.4 oz)    Telemetry    Sinus rhythm. Intermittent atrial fibrillation.  Rate <100 bpm. - Personally Reviewed  ECG    n/a - Personally Reviewed  Physical Exam   GEN: Well-appearing.  No acute distress.   Neck: No JVD Cardiac: RRR, no murmurs, rubs, or gallops.  Respiratory: Clear to auscultation bilaterally.  No crackles, wheezes, or rhonchi. GI: Soft, nontender, non-distended  MS: No edema; No deformity. Neuro:  Nonfocal  Psych: Normal affect   Labs    Chemistry  Recent Labs Lab 03/15/17 0714 03/17/17 0606  NA 137 136  K 4.5 3.5  CL 96* 96*  CO2 26 27  GLUCOSE 107* 96  BUN 65* 46*  CREATININE 6.55* 6.01*  CALCIUM 8.6* 8.4*  GFRNONAA 7* 8*  GFRAA 8* 9*  ANIONGAP 15 13     Hematology  Recent Labs Lab 03/11/17 1052 03/15/17 6213  03/17/17 0606  WBC 9.9 12.8* 7.9  RBC 2.86* 2.75* 2.30*  HGB 9.3* 8.7* 7.3*  HCT 28.4* 27.4* 22.7*  MCV 99.2 99.6 98.7  MCH  --  31.6 31.7  MCHC 32.8 31.8 32.2  RDW 19.1* 18.3* 18.4*  PLT 203.0 224 182    Cardiac Enzymes  Recent Labs Lab 03/15/17 1223 03/15/17 1740 03/15/17 2358  TROPONINI 0.54* 0.54* 0.43*     Recent Labs Lab 03/15/17 0730 03/15/17 0910  TROPIPOC 0.56* 0.53*     BNPNo results for input(s): BNP, PROBNP in the last 168 hours.   DDimer No results for input(s): DDIMER in the last 168 hours.   Radiology    Dg Chest 2 View  Result Date: 03/16/2017 CLINICAL DATA:  Pneumonia. Improved breathing today. History of COPD, CHF, and coronary artery disease. EXAM: CHEST  2 VIEW COMPARISON:  PA and lateral chest x-ray of March 15, 2017 FINDINGS: The lungs remain mildly hyperinflated. There has been interval improvement in the appearance of the pulmonary interstitium bilaterally. There is a small right pleural effusion and trace left pleural effusion. The cardiac silhouette is normal. The pulmonary  vascularity is not engorged. There is calcification in the wall of the aortic arch. There is multilevel degenerative disc disease of the thoracic spine. IMPRESSION: Improving interstitial infiltrates bilaterally. Small bilateral pleural effusions. No CHF. Thoracic aortic atherosclerosis. Electronically Signed   By: David  Martinique M.D.   On: 03/16/2017 07:42    Cardiac Studies   Echo 03/16/17: Study Conclusions  - Left ventricle: The cavity size was normal. Wall thickness was   increased in a pattern of mild LVH. Systolic function was normal.   The estimated ejection fraction was in the range of 50% to 55%.   Wall motion was normal; there were no regional wall motion   abnormalities. Doppler parameters are consistent with abnormal   left ventricular relaxation (grade 1 diastolic dysfunction).   Doppler parameters are consistent with high ventricular filling   pressure. - Aortic valve: Valve mobility was restricted. There was mild   stenosis. - Mitral valve: Calcified annulus. Mildly thickened leaflets . - Left atrium: The atrium was mildly dilated.   Patient Profile     23M with ESRD on HD, paroxysmal atrial fibrillation, hypertension, hyperlipidemia, CVA, and COPD here with hypoxic respiratory failure and HCAP.  Assessment & Plan    # Loss of consciousness: Unclear what happened.  He was hemodynamically stable.  Echo is unremarkable.  No further cardiac testing.   # Paroxysmal atrial fibrillation: Mr. Peake has been in and out of atrial fibrillation.  Rates are well-controlled and he is asymptomatic.  Continue metoprolol and aspirin.  # HCAP Per primary team.   We will sign off.  Please call with any additional questions.   For questions or updates, please contact [place] Please consult [email or web address] for contact info under Cardiology/STEMI.      Signed, Skeet Latch, MD  03/17/2017, 9:43 AM

## 2017-03-17 NOTE — Progress Notes (Signed)
PROGRESS NOTE    Terry [name].  ZOX:[id number] DOB: 1934/08/10 DOA: 03/15/2017 PCP: [name], DO    Brief Narrative:  Patient admitted with pneumonia and being treated with vancomycin and cefepime. Doing better daily.   Assessment & Plan:   Principal Problem:   HCAP (healthcare-associated pneumonia) Active Problems:   Hypertension   Chest pain   High cholesterol   CKD (chronic kidney disease) stage V requiring chronic dialysis (HCC)   COPD (chronic obstructive pulmonary disease) GOLD stage III   Chronic hypoxemic respiratory failure (HCC)   PAF (paroxysmal atrial fibrillation) ([place])   HCAP (healthcare-associated pneumonia) -Continue Maxipime/vancomycin -Follow up on blood cultures; obtain sputum culture; if able to urinate obtain urinary strep  Active Problems:   COPD (chronic obstructive pulmonary disease) GOLD stage III -Continue preadmission nebulizers as well as chronic low-dose prednisone and LABA  -Not actively wheezing and does not appear to be experiencing COPD exacerbation   Anemia - checking stool - no overt bleeds - trend H/H    Chronic hypoxemic respiratory failure  -Typically utilizes 1 L oxygen at HS and prn during the daytime if needed  - wean O2    Hypertension -Current blood pressure control  -Continue Toprol  -No ACE inhibitor secondary to cough     Chest pain -Patient reports has been experiencing exertional chest pain for at least 2-3 weeks  -Has history of 2 stents placed for a 95% blockage in 1999 (RCA)-[place] -ECHO results pending -Consult cardiology -Continue beta blocker, statin and aspirin -Current troponins mildly elevated -continue to cycle     CKD (chronic kidney disease) stage V requiring chronic dialysis  -MWF schedule -Nephrology consulted -Patient reports that typical dry weight 152 pounds and most recent weight 156 pounds-?? Respiratory symptoms secondary to volume overload    High  cholesterol -Continue statin     PAF (paroxysmal atrial fibrillation)  -Previously deemed not a candidate for anticoagulation  -Currently maintaining sinus rhythm      DVT prophylaxis: subcutaneous heparin   Code Status:  full Family Communication: wife   Disposition Plan: home  Consults called: Nephrology/[facility]; Cardiology/[facility]   Consultants:   Cardio and Nephro   Procedures: n/A   Antimicrobials:   Vancomycin cefepime-start date 9/25   Subjective: No acute issues overnight. Patient is comfortable. Denies pain.  Objective: Vitals:   03/17/17 1530 03/17/17 1600 03/17/17 1630 03/17/17 1650  BP: (!) 155/68 (!) 158/69 127/76 (!) 145/59  Pulse: 82 79 82 81  Resp:    18  Temp:    98.1 F (36.7 C)  TempSrc:    Oral  SpO2:    99%  Weight:      Height:        Intake/Output Summary (Last 24 hours) at 03/17/17 1737 Last data filed at 03/17/17 1650  Gross per 24 hour  Intake              890 ml  Output             2000 ml  Net            -1110 ml   Filed Weights   03/15/17 1740 03/15/17 2138 03/16/17 2111  Weight: 67.9 kg (149 lb 9.6 oz) 67.9 kg (149 lb 11.1 oz) 67.4 kg (148 lb 9.4 oz)    Examination:  General exam: Appears calm and comfortable; A&Ox3 Respiratory system: Basilar crackles. Respiratory effort normal. Cardiovascular system: S1 & S2 heard, RRR. No JVD, murmurs, rubs, gallops  or clicks. No pedal edema. Gastrointestinal system: Abdomen is nondistended, soft and nontender. No organomegaly or masses felt. Normal bowel sounds heard. Central nervous system: Alert and oriented. No focal neurological deficits. Extremities: Symmetric 5 x 5 power. Skin: No rashes, lesions or ulcers Psychiatry: Judgement and insight appear normal. Mood & affect appropriate.     Data Reviewed: I have personally reviewed following labs and imaging studies  CBC:  Recent Labs Lab 03/11/17 1052 03/15/17 0714 03/17/17 0606 03/17/17 0942  WBC 9.9 12.8* 7.9  --    NEUTROABS 8.5*  --  5.4  --   HGB 9.3* 8.7* 7.3* 8.2*  HCT 28.4* 27.4* 22.7* 25.9*  MCV 99.2 99.6 98.7  --   PLT 203.0 224 182  --    Basic Metabolic Panel:  Recent Labs Lab 03/15/17 0714 03/17/17 0606  NA 137 136  K 4.5 3.5  CL 96* 96*  CO2 26 27  GLUCOSE 107* 96  BUN 65* 46*  CREATININE 6.55* 6.01*  CALCIUM 8.6* 8.4*   GFR: Estimated Creatinine Clearance: 9.5 mL/min (A) (by C-G formula based on SCr of 6.01 mg/dL (H)). Liver Function Tests: No results for input(s): AST, ALT, ALKPHOS, BILITOT, PROT, ALBUMIN in the last 168 hours. No results for input(s): LIPASE, AMYLASE in the last 168 hours. No results for input(s): AMMONIA in the last 168 hours. Coagulation Profile: No results for input(s): INR, PROTIME in the last 168 hours. Cardiac Enzymes:  Recent Labs Lab 03/15/17 1223 03/15/17 1740 03/15/17 2358  TROPONINI 0.54* 0.54* 0.43*   BNP (last 3 results) No results for input(s): PROBNP in the last 8760 hours. HbA1C: No results for input(s): HGBA1C in the last 72 hours. CBG: No results for input(s): GLUCAP in the last 168 hours. Lipid Profile: No results for input(s): CHOL, HDL, LDLCALC, TRIG, CHOLHDL, LDLDIRECT in the last 72 hours. Thyroid Function Tests: No results for input(s): TSH, T4TOTAL, FREET4, T3FREE, THYROIDAB in the last 72 hours. Anemia Panel:  Recent Labs  03/15/17 1223  FERRITIN 1,075*  TIBC 230*  IRON 39*   Sepsis Labs:  Recent Labs Lab 03/15/17 1223 03/17/17 0606  PROCALCITON 0.25 0.78    Recent Results (from the past 240 hour(s))  Culture, blood (routine x 2) Call MD if unable to obtain prior to antibiotics being given     Status: None (Preliminary result)   Collection Time: 03/15/17  9:06 AM  Result Value Ref Range Status   Specimen Description BLOOD LEFT ANTECUBITAL  Final   Special Requests   Final    BOTTLES DRAWN AEROBIC AND ANAEROBIC Blood Culture adequate volume   Culture NO GROWTH 2 DAYS  Final   Report Status PENDING   Incomplete  Culture, blood (routine x 2) Call MD if unable to obtain prior to antibiotics being given     Status: None (Preliminary result)   Collection Time: 03/15/17  9:20 AM  Result Value Ref Range Status   Specimen Description BLOOD LEFT UPPER ARM  Final   Special Requests   Final    BOTTLES DRAWN AEROBIC AND ANAEROBIC Blood Culture adequate volume   Culture NO GROWTH 2 DAYS  Final   Report Status PENDING  Incomplete  MRSA PCR Screening     Status: Abnormal   Collection Time: 03/15/17 10:54 PM  Result Value Ref Range Status   MRSA by PCR POSITIVE (A) NEGATIVE Final    Comment:        The GeneXpert MRSA Assay (FDA approved for NASAL specimens only), is  one component of a comprehensive MRSA colonization surveillance program. It is not intended to diagnose MRSA infection nor to guide or monitor treatment for MRSA infections. RESULT CALLED TO, READ BACK BY AND VERIFIED WITH: Hanley Ben RN 10:05 03/16/17 ([email or web address])          Radiology Studies: Dg Chest 2 View  Result Date: 03/16/2017 CLINICAL DATA:  Pneumonia. Improved breathing today. History of COPD, CHF, and coronary artery disease. EXAM: CHEST  2 VIEW COMPARISON:  PA and lateral chest x-ray of March 15, 2017 FINDINGS: The lungs remain mildly hyperinflated. There has been interval improvement in the appearance of the pulmonary interstitium bilaterally. There is a small right pleural effusion and trace left pleural effusion. The cardiac silhouette is normal. The pulmonary vascularity is not engorged. There is calcification in the wall of the aortic arch. There is multilevel degenerative disc disease of the thoracic spine. IMPRESSION: Improving interstitial infiltrates bilaterally. Small bilateral pleural effusions. No CHF. Thoracic aortic atherosclerosis. Electronically Signed   By: David  Martinique M.D.   On: 03/16/2017 07:42        Scheduled Meds: . albuterol  2.5 mg Nebulization Q6H  . aspirin EC  325 mg Oral Daily  .  atorvastatin  20 mg Oral Daily  . calcium acetate  667-1,334 mg Oral TID WC  . [START ON 03/18/2017] Chlorhexidine Gluconate Cloth  6 each Topical Q0600  . cholecalciferol  1,000 Units Oral Daily  . darbepoetin (ARANESP) injection - DIALYSIS  100 mcg Intravenous Q Mon-HD  . doxercalciferol  3 mcg Intravenous Q M,W,F-HD  . feeding supplement (NEPRO CARB STEADY)  237 mL Oral BID BM  . guaiFENesin  600 mg Oral BID  . heparin  5,000 Units Subcutaneous Q8H  . mouth rinse  15 mL Mouth Rinse BID  . metoprolol succinate  50 mg Oral Daily  . midodrine  10 mg Oral Q M,W,F-HD  . mometasone-formoterol  2 puff Inhalation BID  . multivitamin  1 tablet Oral QHS  . mupirocin ointment  1 application Nasal BID  . polyvinyl alcohol  1 drop Both Eyes QID  . predniSONE  5 mg Oral Q breakfast  . tiotropium  18 mcg Inhalation Daily   Continuous Infusions: . sodium chloride    . sodium chloride    . ceFEPime (MAXIPIME) IV    . ferric gluconate (FERRLECIT/NULECIT) IV    . vancomycin 750 mg (03/17/17 1545)     LOS: 0 days    Time spent: 24min    Elwin Mocha, MD [facility] Pager AMION  If 7PM-7AM, please contact night-coverage [email or web address] Password [email or web address] 03/17/2017, 5:37 PM

## 2017-03-18 LAB — CBC
HEMATOCRIT: 26.7 % — AB (ref 39.0–52.0)
HEMOGLOBIN: 8.4 g/dL — AB (ref 13.0–17.0)
MCH: 31.2 pg (ref 26.0–34.0)
MCHC: 31.5 g/dL (ref 30.0–36.0)
MCV: 99.3 fL (ref 78.0–100.0)
Platelets: 221 10*3/uL (ref 150–400)
RBC: 2.69 MIL/uL — ABNORMAL LOW (ref 4.22–5.81)
RDW: 18.6 % — ABNORMAL HIGH (ref 11.5–15.5)
WBC: 9.2 10*3/uL (ref 4.0–10.5)

## 2017-03-18 LAB — RENAL FUNCTION PANEL
ANION GAP: 11 (ref 5–15)
Albumin: 3.2 g/dL — ABNORMAL LOW (ref 3.5–5.0)
BUN: 21 mg/dL — ABNORMAL HIGH (ref 6–20)
CHLORIDE: 96 mmol/L — AB (ref 101–111)
CO2: 30 mmol/L (ref 22–32)
Calcium: 8.4 mg/dL — ABNORMAL LOW (ref 8.9–10.3)
Creatinine, Ser: 3.28 mg/dL — ABNORMAL HIGH (ref 0.61–1.24)
GFR calc Af Amer: 19 mL/min — ABNORMAL LOW (ref >60)
GFR calc non Af Amer: 17 mL/min — ABNORMAL LOW (ref >60)
GLUCOSE: 120 mg/dL — AB (ref 65–99)
Phosphorus: 3.5 mg/dL (ref 2.5–4.6)
Potassium: 4.4 mmol/L (ref 3.5–5.1)
SODIUM: 137 mmol/L (ref 135–145)

## 2017-03-18 MED ORDER — ALBUTEROL SULFATE (2.5 MG/3ML) 0.083% IN NEBU: 2.5000 mg | INHALATION_SOLUTION | RESPIRATORY_TRACT | Status: DC | PRN

## 2017-03-18 MED ORDER — ALBUTEROL SULFATE (2.5 MG/3ML) 0.083% IN NEBU: 2.5000 mg | INHALATION_SOLUTION | Freq: Four times a day (QID) | RESPIRATORY_TRACT | 0 refills | Status: DC | PRN

## 2017-03-18 MED ORDER — GUAIFENESIN ER 600 MG PO TB12: 600.0000 mg | ORAL_TABLET | Freq: Two times a day (BID) | ORAL | 0 refills | Status: AC

## 2017-03-18 MED ORDER — CEFDINIR 300 MG PO CAPS: 300.0000 mg | ORAL_CAPSULE | Freq: Every day | ORAL | 0 refills | Status: AC

## 2017-03-18 MED ORDER — ALBUTEROL SULFATE (2.5 MG/3ML) 0.083% IN NEBU: 2.5000 mg | INHALATION_SOLUTION | Freq: Three times a day (TID) | RESPIRATORY_TRACT | Status: DC

## 2017-03-18 MED ORDER — MUPIROCIN 2 % EX OINT: 1.0000 "application " | TOPICAL_OINTMENT | Freq: Two times a day (BID) | CUTANEOUS | 0 refills | Status: AC

## 2017-03-18 MED ORDER — POLYVINYL ALCOHOL 1.4 % OP SOLN: 1.0000 [drp] | Freq: Four times a day (QID) | OPHTHALMIC | 0 refills | Status: DC | PRN

## 2017-03-18 NOTE — Discharge Instructions (Addendum)
Wear 2L of O2 when ambulating or at rest.   Community-Acquired Pneumonia, Adult Pneumonia is an infection of the lungs. There are different types of pneumonia. One type can develop while a person is in a hospital. A different type, called community-acquired pneumonia, develops in people who are not, or have not recently been, in the hospital or other health care facility. What are the causes? Pneumonia may be caused by bacteria, viruses, or funguses. Community-acquired pneumonia is often caused by Streptococcus pneumonia bacteria. These bacteria are often passed from one person to another by breathing in droplets from the cough or sneeze of an infected person. What increases the risk? The condition is more likely to develop in:  People who havechronic diseases, such as chronic obstructive pulmonary disease (COPD), asthma, congestive heart failure, cystic fibrosis, diabetes, or kidney disease.  People who haveearly-stage or late-stage HIV.  People who havesickle cell disease.  People who havehad their spleen removed (splenectomy).  People who havepoor Human resources officer.  People who havemedical conditions that increase the risk of breathing in (aspirating) secretions their own mouth and nose.  People who havea weakened immune system (immunocompromised).  People who smoke.  People whotravel to areas where pneumonia-causing germs commonly exist.  People whoare around animal habitats or animals that have pneumonia-causing germs, including birds, bats, rabbits, cats, and farm animals.  What are the signs or symptoms? Symptoms of this condition include:  Adry cough.  A wet (productive) cough.  Fever.  Sweating.  Chest pain, especially when breathing deeply or coughing.  Rapid breathing or difficulty breathing.  Shortness of breath.  Shaking chills.  Fatigue.  Muscle aches.  How is this diagnosed? Your health care provider will take a medical history and perform a  physical exam. You may also have other tests, including:  Imaging studies of your chest, including X-rays.  Tests to check your blood oxygen level and other blood gases.  Other tests on blood, mucus (sputum), fluid around your lungs (pleural fluid), and urine.  If your pneumonia is severe, other tests may be done to identify the specific cause of your illness. How is this treated? The type of treatment that you receive depends on many factors, such as the cause of your pneumonia, the medicines you take, and other medical conditions that you have. For most adults, treatment and recovery from pneumonia may occur at home. In some cases, treatment must happen in a hospital. Treatment may include:  Antibiotic medicines, if the pneumonia was caused by bacteria.  Antiviral medicines, if the pneumonia was caused by a virus.  Medicines that are given by mouth or through an IV tube.  Oxygen.  Respiratory therapy.  Although rare, treating severe pneumonia may include:  Mechanical ventilation. This is done if you are not breathing well on your own and you cannot maintain a safe blood oxygen level.  Thoracentesis. This procedureremoves fluid around one lung or both lungs to help you breathe better.  Follow these instructions at home:  Take over-the-counter and prescription medicines only as told by your health care provider. ? Only takecough medicine if you are losing sleep. Understand that cough medicine can prevent your bodys natural ability to remove mucus from your lungs. ? If you were prescribed an antibiotic medicine, take it as told by your health care provider. Do not stop taking the antibiotic even if you start to feel better.  Sleep in a semi-upright position at night. Try sleeping in a reclining chair, or place a few pillows under  your head.  Do not use tobacco products, including cigarettes, chewing tobacco, and e-cigarettes. If you need help quitting, ask your health care  provider.  Drink enough water to keep your urine clear or pale yellow. This will help to thin out mucus secretions in your lungs. How is this prevented? There are ways that you can decrease your risk of developing community-acquired pneumonia. Consider getting a pneumococcal vaccine if:  You are older than 80 years of age.  You are older than 80 years of age and are undergoing cancer treatment, have chronic lung disease, or have other medical conditions that affect your immune system. Ask your health care provider if this applies to you.  There are different types and schedules of pneumococcal vaccines. Ask your health care provider which vaccination option is best for you. You may also prevent community-acquired pneumonia if you take these actions:  Get an influenza vaccine every year. Ask your health care provider which type of influenza vaccine is best for you.  Go to the dentist on a regular basis.  Wash your hands often. Use hand sanitizer if soap and water are not available.  Contact a health care provider if:  You have a fever.  You are losing sleep because you cannot control your cough with cough medicine. Get help right away if:  You have worsening shortness of breath.  You have increased chest pain.  Your sickness becomes worse, especially if you are an older adult or have a weakened immune system.  You cough up blood. This information is not intended to replace advice given to you by your health care provider. Make sure you discuss any questions you have with your health care provider. Document Released: 06/08/2005 Document Revised: 10/17/2015 Document Reviewed: 10/03/2014 Elsevier Interactive Patient Education  2017 [employer].

## 2017-03-18 NOTE — Progress Notes (Signed)
Pt walked to door in room from bed on room air and oxygen dropped to 86% and RR 22. Placed back on 2L [place] and oxygen increased to 96%. Pt then ambulated in hall with 2 LNC and oxygen dropped to 90% and RR 20. Pt sat back in bed an oxygen increased to 95%.

## 2017-03-18 NOTE — Discharge Summary (Signed)
Physician Discharge Summary  Terry [name]. WIO:[id number] DOB: [birth year] DOA: 03/15/2017  PCP: [name], DO  Admit date: 03/15/2017 Discharge date: 03/18/2017  Time spent: 35 minutes  Recommendations for Outpatient Follow-up:  1. F/u with PCP   Discharge Diagnoses:  Principal Problem:   HCAP (healthcare-associated pneumonia) Active Problems:   Hypertension   Chest pain   High cholesterol   CKD (chronic kidney disease) stage V requiring chronic dialysis (HCC)   COPD (chronic obstructive pulmonary disease) GOLD stage III   Chronic hypoxemic respiratory failure (HCC)   PAF (paroxysmal atrial fibrillation) ([place])   Discharge Condition: stable  Diet recommendation: renal  Filed Weights   03/17/17 1247 03/17/17 1650 03/17/17 2335  Weight: 69.9 kg (154 lb 1.6 oz) 67.9 kg (149 lb 11.1 oz) 68 kg (150 lb)    History of present illness:  Terry [name]. is a 82 y.o. male with medical history significant for PAF not on anticoagulation, CKD on dialysis (MWF), remote history of CAD with 2 stents in 1999, COPD on nocturnal oxygen, hypertension, dyslipidemia. Patient has been following with his pulmonologist regularly since an April admit for HCAP. Last evaluated by pulmonology on 9/18 where his prednisone dosage as well as his SABA dosages were temporarily increased. At that visit he was encouraged to utilize his oxygen nocturnally for O2 sats less than or equal to 90%. He was also reporting chest discomfort that the pulmonologist felt may be related to his underlying coronary disease and referred him back to cardiology. In further discussion with the patient he does report that he has exertional chest pain and decreased exercise tolerance. In regards to his current presenting symptoms, he awakened around 345 am this morning with shortness of breath despite utilizing oxygen as prescribed. He presented to the ER where he was found to have a focal right-sided infiltrate concerning for  pneumonia. He had leukocytosis of 12,800 but differential was not obtained. He also has elevated poc troponin at 0.53 and 0.56. Blood cultures have been obtained in the ER and empiric broad-spectrum antibiotics initiated.  Hospital Course:  Started on IV abx for HCAP. Blood cultures x2 were neg. Pt with anemia and stool occult neg. H/H stable. Exertional CP better. Echo w/o wall motion abn. Cleared by cardiology. Likely due to pna. Pt did have an episode of syncope during HD, he was re-cleared by cardio. Optho saw pt for dry eyes and advised wetting drops.  Procedures: ECHO 9/25 Impressions: - Low normal to mildly reduced LV systolic function (EF 50); mild   diastolic dysfunction; mild LVH; calcified aortic valve with   fixed right coronary cusp; mild AS (mean gradient 13 mmHg); mild LAE.  Consultations:  Cardio, Nephro, Optho  Discharge Exam: Vitals:   03/18/17 0908 03/18/17 1406  BP:    Pulse:    Resp:    Temp:    SpO2: 97% 98%    General: NAD< A&Ox3 Cardiovascular: RRR, no MRG, no JVD Respiratory: basilar crackles, nl wob  Discharge Instructions  Use albuterol twice a day. Every 2 hours prn sob. F/u with PCP.  Discharge Medication List as of 03/18/2017  3:43 PM    START taking these medications   Details  albuterol (PROVENTIL) (2.5 MG/3ML) 0.083% nebulizer solution Take 3 mLs (2.5 mg total) by nebulization every 6 (six) hours as needed for wheezing or shortness of breath., Starting Thu 03/18/2017, Normal    cefdinir (OMNICEF) 300 MG capsule Take 1 capsule (300 mg total) by mouth daily., Starting  Thu 03/18/2017, Until Sun 03/28/2017, Normal    mupirocin ointment (BACTROBAN) 2 % Place 1 application into the nose 2 (two) times daily., Starting Thu 03/18/2017, Until Sun 03/21/2017, Normal    polyvinyl alcohol (LIQUIFILM TEARS) 1.4 % ophthalmic solution Place 1 drop into both eyes 4 (four) times daily as needed for dry eyes., Starting Thu 03/18/2017, Normal      CONTINUE  these medications which have CHANGED   Details  guaiFENesin (MUCINEX) 600 MG 12 hr tablet Take 1 tablet (600 mg total) by mouth 2 (two) times daily., Starting Thu 03/18/2017, Until Tue 03/23/2017, Normal      CONTINUE these medications which have NOT CHANGED   Details  aspirin EC 325 MG tablet Take 1 tablet (325 mg total) by mouth daily., Starting Thu 09/26/2015, No Print    atorvastatin (LIPITOR) 20 MG tablet Take 1 tablet (20 mg total) by mouth daily., Starting Mon 09/21/2016, Normal    calcium acetate (PHOSLO) 667 MG capsule Take 667-1,334 mg by mouth 3 (three) times daily with meals. , Starting Thu 01/09/2016, Historical Med    cholecalciferol (VITAMIN D) 1000 units tablet Take 1,000 Units by mouth daily., Historical Med    methocarbamol (ROBAXIN) 500 MG tablet Take 500 mg by mouth 2 (two) times daily as needed for muscle spasms., Historical Med    metoprolol succinate (TOPROL-XL) 50 MG 24 hr tablet Take 50 mg by mouth 2 (two) times daily. Take with or immediately following a meal. , Historical Med    midodrine (PROAMATINE) 10 MG tablet Take 10 mg by mouth as needed. At dialysis for low pressure, Historical Med    multivitamin (RENA-VIT) TABS tablet Take 1 tablet by mouth daily., Historical Med    OXYGEN Inhale 1 L into the lungs at bedtime. , Historical Med    predniSONE (DELTASONE) 10 MG tablet Take 0.5 tablets (5 mg total) by mouth daily with breakfast. Take 1/2 tablet by mouth daily, Starting Thu 10/22/2016, Normal    SYMBICORT 160-4.5 MCG/ACT inhaler INHALE 2 PUFFS TWICE DAILY, Normal    tiotropium (SPIRIVA HANDIHALER) 18 MCG inhalation capsule One capsule each am, No Print      STOP taking these medications     ipratropium-albuterol (DUONEB) 0.5-2.5 (3) MG/3ML SOLN        Allergies  Allergen Reactions  . Yellow Dyes (Non-Tartrazine) Other (See Comments)    Burns arms Cough medications (tussionex and benzonatate ok)  . Levaquin [Levofloxacin In D5w] Itching  . Tussionex  Pennkinetic Er [Hydrocod Polst-Cpm Polst Er] Other (See Comments)    Unknown on MAR      The results of significant diagnostics from this hospitalization (including imaging, microbiology, ancillary and laboratory) are listed below for reference.    Significant Diagnostic Studies: Dg Chest 2 View  Result Date: 03/16/2017 CLINICAL DATA:  Pneumonia. Improved breathing today. History of COPD, CHF, and coronary artery disease. EXAM: CHEST  2 VIEW COMPARISON:  PA and lateral chest x-ray of March 15, 2017 FINDINGS: The lungs remain mildly hyperinflated. There has been interval improvement in the appearance of the pulmonary interstitium bilaterally. There is a small right pleural effusion and trace left pleural effusion. The cardiac silhouette is normal. The pulmonary vascularity is not engorged. There is calcification in the wall of the aortic arch. There is multilevel degenerative disc disease of the thoracic spine. IMPRESSION: Improving interstitial infiltrates bilaterally. Small bilateral pleural effusions. No CHF. Thoracic aortic atherosclerosis. Electronically Signed   By: David  Martinique M.D.   On: 03/16/2017 07:42  Dg Chest 2 View  Result Date: 03/15/2017 CLINICAL DATA:  Cough and shortness of breath.  Weakness. EXAM: CHEST  2 VIEW COMPARISON:  03/11/2017 CT.  Plain film 03/09/2017 FINDINGS: Hyperinflation. Midline trachea. Borderline cardiomegaly. Atherosclerosis in the transverse aorta. Developing small bilateral pleural effusions, greater on the right. No pneumothorax. Patchy right infrahilar and lower lobe airspace disease is new or progressive. Pulmonary interstitial prominence remains. IMPRESSION: Since 03/09/2017, developing right-sided airspace disease and small bilateral pleural effusions. Favor pneumonia. Asymmetric pulmonary edema felt less likely. Borderline cardiomegaly, with transverse aortic atherosclerosis. Electronically Signed   By: Abigail Miyamoto M.D.   On: 03/15/2017 07:51   Dg  Chest 2 View  Result Date: 03/09/2017 CLINICAL DATA:  Dyspnea, cough. EXAM: CHEST  2 VIEW COMPARISON:  Radiographs of February 09, 2017. FINDINGS: The heart size and mediastinal contours are within normal limits. Atherosclerosis of thoracic aorta is noted. No pneumothorax or pleural effusion is noted. Stable right basilar scarring is noted. Left upper lobe opacity noted on prior exam is significantly smaller currently. The visualized skeletal structures are unremarkable. IMPRESSION: Stable right basilar scarring. Aortic atherosclerosis. Left upper lobe opacity noted on prior exam is significantly smaller, consistent with resolving inflammation with residual scarring present. Electronically Signed   By: Marijo Conception, M.D.   On: 03/09/2017 14:30   Ct Angio Chest W/cm &/or Wo Cm  Result Date: 03/11/2017 CLINICAL DATA:  Increasing shortness of breath after full a to [place] area last week. Evaluate for pulmonary embolism. History of COPD. End-stage renal disease on dialysis. EXAM: CT ANGIOGRAPHY CHEST WITH CONTRAST TECHNIQUE: Multidetector CT imaging of the chest was performed using the standard protocol during bolus administration of intravenous contrast. Multiplanar CT image reconstructions and MIPs were obtained to evaluate the vascular anatomy. CONTRAST:  80 mL Isovue 370 IV. COMPARISON:  Chest CT 06/13/2015 and chest x-ray 03/09/2017, 02/09/2017, 02/02/2017 and 09/29/2016 FINDINGS: Cardiovascular: Mild prominence of the heart. Calcified plaque over the left main and 3 vessel coronary arteries. Moderate calcified plaque over the thoracic aorta. No evidence of pulmonary embolism. Mediastinum/Nodes: No evidence of mediastinal or hilar adenopathy. Remaining mediastinal structures are unremarkable. Lungs/Pleura: Lungs are adequately inflated demonstrate pleural-based density extending in a linear fashion inferiorly over the left apex/upper lobe as this was noted to be improving on the recent chest radiographs. Very  few subtle nodular foci of airspace density over the right lung which may be due to an infectious or inflammatory process. Mild posterior bibasilar dependent atelectasis and tiny amount of bilateral pleural fluid. Airways are within normal. Upper Abdomen: Couple adjacent 1 cm hypodensities over the left lobe of the liver unchanged likely cysts. Moderate calcified plaque over the abdominal aorta. Numerous bilateral hypo L1 hyperdense renal cortical lesions likely simple and hemorrhagic cysts. Musculoskeletal: Degenerate change of the spine per Review of the MIP images confirms the above findings. IMPRESSION: No evidence of pulmonary embolism. Pleural based linear density over the left apex demonstrating improvement on recent chest radiographs likely resolving atelectasis/infection. A few scattered subtle nodular airspace densities over the right lung which may be due to infectious or inflammatory process. Mild bibasilar atelectasis and tiny amount of bilateral pleural fluid. Recommend follow-up chest CT 4-6 weeks. Mild cardiomegaly. Left main and 3 vessel atherosclerotic coronary artery disease. Two stable 1 cm left liver hypodensities likely cysts. Numerous hypo and hyperdense bilateral renal cortical lesions likely simple and hemorrhagic cysts. Recommend follow-up pre and post-contrast CT on elective basis. Aortic Atherosclerosis (ICD10-I70.0). Electronically Signed   By: Quillian Quince  Derrel Nip M.D.   On: 03/11/2017 09:16    Microbiology: Recent Results (from the past 240 hour(s))  Culture, blood (routine x 2) Call MD if unable to obtain prior to antibiotics being given     Status: None (Preliminary result)   Collection Time: 03/15/17  9:06 AM  Result Value Ref Range Status   Specimen Description BLOOD LEFT ANTECUBITAL  Final   Special Requests   Final    BOTTLES DRAWN AEROBIC AND ANAEROBIC Blood Culture adequate volume   Culture NO GROWTH 3 DAYS  Final   Report Status PENDING  Incomplete  Culture, blood  (routine x 2) Call MD if unable to obtain prior to antibiotics being given     Status: None (Preliminary result)   Collection Time: 03/15/17  9:20 AM  Result Value Ref Range Status   Specimen Description BLOOD LEFT UPPER ARM  Final   Special Requests   Final    BOTTLES DRAWN AEROBIC AND ANAEROBIC Blood Culture adequate volume   Culture NO GROWTH 3 DAYS  Final   Report Status PENDING  Incomplete  MRSA PCR Screening     Status: Abnormal   Collection Time: 03/15/17 10:54 PM  Result Value Ref Range Status   MRSA by PCR POSITIVE (A) NEGATIVE Final    Comment:        The GeneXpert MRSA Assay (FDA approved for NASAL specimens only), is one component of a comprehensive MRSA colonization surveillance program. It is not intended to diagnose MRSA infection nor to guide or monitor treatment for MRSA infections. RESULT CALLED TO, READ BACK BY AND VERIFIED WITH: V. Dildy RN 10:05 03/16/17 ([email or web address])      Labs: Basic Metabolic Panel:  Recent Labs Lab 03/15/17 0714 03/17/17 0606 03/18/17 0004  NA 137 136 137  K 4.5 3.5 4.4  CL 96* 96* 96*  CO2 26 27 30   GLUCOSE 107* 96 120*  BUN 65* 46* 21*  CREATININE 6.55* 6.01* 3.28*  CALCIUM 8.6* 8.4* 8.4*  PHOS  --   --  3.5   Liver Function Tests:  Recent Labs Lab 03/18/17 0004  ALBUMIN 3.2*   No results for input(s): LIPASE, AMYLASE in the last 168 hours. No results for input(s): AMMONIA in the last 168 hours. CBC:  Recent Labs Lab 03/15/17 0714 03/17/17 0606 03/17/17 0942 03/17/17 1846 03/18/17 0004  WBC 12.8* 7.9  --   --  9.2  NEUTROABS  --  5.4  --   --   --   HGB 8.7* 7.3* 8.2* 8.9* 8.4*  HCT 27.4* 22.7* 25.9* 27.8* 26.7*  MCV 99.6 98.7  --   --  99.3  PLT 224 182  --   --  221   Cardiac Enzymes:  Recent Labs Lab 03/15/17 1223 03/15/17 1740 03/15/17 2358  TROPONINI 0.54* 0.54* 0.43*   BNP: BNP (last 3 results)  Recent Labs  09/23/16 2216  BNP 1,438.4*    ProBNP (last 3 results) No results for  input(s): PROBNP in the last 8760 hours.  CBG: No results for input(s): GLUCAP in the last 168 hours.     Signed:  Elwin Mocha MD  FACP  [facility] 03/18/2017, 2:51 PM

## 2017-03-18 NOTE — Plan of Care (Signed)
Problem: Pain Managment: Goal: General experience of comfort will improve Outcome: Progressing Pt reports that he is breathing better. States that he is ready to go home. Aware of need to ambulate without oxygen to check oxygen saturation. Will evaluate after breakfast per patient request.

## 2017-03-18 NOTE — Discharge Summary (Signed)
Pt given discharge instructions, prescriptions, and follow up info. Wife to pick pt up. Denies questions.

## 2017-03-18 NOTE — Progress Notes (Signed)
[facility] Progress Note  Dialysis Orders: [facility] MWF 4 hr 400/800 2K 2 Ca profile 4 left upper AVF heparin 2500 venofer 50 q Wed Hectorol 3 Mircera 60 - last got 50 on 9/12 and prior to that 100 in August EDW 68.5  Recent labs: hgb dropping - was10.1 8/22 8.9 9/19 12% iPTH 302 Ca/P ok hgb A1c 5.8   Assessment/Plan: 1. RLL HCAP in patinet with severe COPD with some degree of fluid - overload -s/p culture and empiric antibiotics; repeat CXR 9/25 showed improving interstitial infiltrates bilaterally. Small pleural effusions. No CHF 2. ESRD- MWF - HD Friday if not d/c today  3. Hypertension/volume- MTP XL + midodrine for BP support net UF 2.5 L 924 with post wt 67.4; tolerated net UF of 2 L Wed with post wt 67.9 (standing) - no BP drops during HD yesterday 140s was the lowest 4. Anemia-hgb 7.3 9/26 rechecked several times yesterday - in the 8s - is 8.4 this am  -? Related to ESA and/or Fe deficit - had been on higher dose Mircera in August 100 or more - tsat in August 12% had Fe 100 x 5 replettion-tsat 17% -ferritin 1075- Aranesp ^ to 100 9/24; will increase IV Fe and check hemocult, which has not been done yet 5. Metabolic bone disease- Continue VDRA/binders Ca/P ok  6. Nutrition- needs renal carb mod/vitamin alb 3.2 7. PAF-off coumadin since 07/2015- on MTP 8. CAD - hx stents in 1999 - cards following  Myriam Jacobson, PA-C [facility] [phone] 03/18/2017,8:02 AM  LOS: 1 day   Subjective:   Wants to go home. Wants to check on his home in [place] this weekend. Waiting for breakfast.  IF he stays overnight, wants HD AFTER breakfast Friday. Dialysis went well yesterday.  Objective Vitals:   03/17/17 1650 03/17/17 2141 03/17/17 2335 03/18/17 0556  BP: (!) 145/59 (!) 145/50  (!) 145/51  Pulse: 81 78  88  Resp: 18 14  16   Temp: 98.1 F (36.7 C) 97.7 F (36.5 C)  98.3 F (36.8 C)  TempSrc: Oral     SpO2: 99% 99%  96%  Weight: 67.9 kg (149  lb 11.1 oz)  68 kg (150 lb)   Height:       Physical Exam General: NAD Heart: irreg Lungs: clear right dim left base Abdomen: soft NT Extremities: no LE edema Dialysis Access: left upper AVF + bruit   Additional Objective Labs: Basic Metabolic Panel:  Recent Labs Lab 03/15/17 0714 03/17/17 0606 03/18/17 0004  NA 137 136 137  K 4.5 3.5 4.4  CL 96* 96* 96*  CO2 26 27 30   GLUCOSE 107* 96 120*  BUN 65* 46* 21*  CREATININE 6.55* 6.01* 3.28*  CALCIUM 8.6* 8.4* 8.4*  PHOS  --   --  3.5   Liver Function Tests:  Recent Labs Lab 03/18/17 0004  ALBUMIN 3.2*   No results for input(s): LIPASE, AMYLASE in the last 168 hours. CBC:  Recent Labs Lab 03/11/17 1052 03/15/17 0714 03/17/17 0606 03/17/17 0942 03/17/17 1846 03/18/17 0004  WBC 9.9 12.8* 7.9  --   --  9.2  NEUTROABS 8.5*  --  5.4  --   --   --   HGB 9.3* 8.7* 7.3* 8.2* 8.9* 8.4*  HCT 28.4* 27.4* 22.7* 25.9* 27.8* 26.7*  MCV 99.2 99.6 98.7  --   --  99.3  PLT 203.0 224 182  --   --  221   Blood Culture  Component Value Date/Time   SDES BLOOD LEFT UPPER ARM 03/15/2017 0920   SPECREQUEST  03/15/2017 0920    BOTTLES DRAWN AEROBIC AND ANAEROBIC Blood Culture adequate volume   CULT NO GROWTH 2 DAYS 03/15/2017 0920   REPTSTATUS PENDING 03/15/2017 0920    Cardiac Enzymes:  Recent Labs Lab 03/15/17 1223 03/15/17 1740 03/15/17 2358  TROPONINI 0.54* 0.54* 0.43*   CBG: No results for input(s): GLUCAP in the last 168 hours. Iron Studies:  Recent Labs  03/15/17 1223  IRON 39*  TIBC 230*  FERRITIN 1,075*   Lab Results  Component Value Date   INR 0.97 12/28/2016   INR 1.05 07/28/2015   INR 1.11 06/30/2015   Studies/Results: No results found. Medications: . ceFEPime (MAXIPIME) IV 2 g (03/17/17 1808)  . ferric gluconate (FERRLECIT/NULECIT) IV    . vancomycin Stopped (03/17/17 1645)   . albuterol  2.5 mg Nebulization Q6H  . aspirin EC  325 mg Oral Daily  . atorvastatin  20 mg Oral Daily  .  calcium acetate  667-1,334 mg Oral TID WC  . Chlorhexidine Gluconate Cloth  6 each Topical Q0600  . cholecalciferol  1,000 Units Oral Daily  . darbepoetin (ARANESP) injection - DIALYSIS  100 mcg Intravenous Q Mon-HD  . doxercalciferol  3 mcg Intravenous Q M,W,F-HD  . feeding supplement (NEPRO CARB STEADY)  237 mL Oral BID BM  . guaiFENesin  600 mg Oral BID  . heparin  5,000 Units Subcutaneous Q8H  . mouth rinse  15 mL Mouth Rinse BID  . metoprolol succinate  50 mg Oral Daily  . midodrine  10 mg Oral Q M,W,F-HD  . mometasone-formoterol  2 puff Inhalation BID  . multivitamin  1 tablet Oral QHS  . mupirocin ointment  1 application Nasal BID  . polyvinyl alcohol  1 drop Both Eyes QID  . predniSONE  5 mg Oral Q breakfast  . tiotropium  18 mcg Inhalation Daily    I have seen and examined this patient and agree with plan and assessment in the above note with renal recommendations/intervention highlighted.  He feels much better today.  No episodes with HD yesterday.  Possible discharge to home and f/u with outpatient HD tomorrow per primary svc.  He reports that he has home O2. Broad[place] John A Karrington Studnicka,MD 03/18/2017 10:58 AM

## 2017-03-19 DIAGNOSIS — N2581 Secondary hyperparathyroidism of renal origin: Secondary | ICD-10-CM | POA: Diagnosis not present

## 2017-03-19 DIAGNOSIS — Z23 Encounter for immunization: Secondary | ICD-10-CM | POA: Diagnosis not present

## 2017-03-19 DIAGNOSIS — N186 End stage renal disease: Secondary | ICD-10-CM | POA: Diagnosis not present

## 2017-03-19 DIAGNOSIS — D631 Anemia in chronic kidney disease: Secondary | ICD-10-CM | POA: Diagnosis not present

## 2017-03-20 ENCOUNTER — Telehealth: Payer: Self-pay | Admitting: Pulmonary Disease

## 2017-03-20 LAB — CULTURE, BLOOD (ROUTINE X 2)
CULTURE: NO GROWTH
Culture: NO GROWTH
SPECIAL REQUESTS: ADEQUATE
Special Requests: ADEQUATE

## 2017-03-20 NOTE — Telephone Encounter (Signed)
Wife reports patient's oxygen has been fluctuating from 83% to 89% on 3 L/m on a portable concentrator. They are currently in [name]. He recently was hospitalized with HCAP and discharged on 9/27. Patient is able to wake up easily. He denies any fever, pain or productive cough. Before pneumonia the patient was only requiring 1 L/m while sleeping & none during the day. Recommended evaluation at a local [facility].

## 2017-03-21 DIAGNOSIS — E1122 Type 2 diabetes mellitus with diabetic chronic kidney disease: Secondary | ICD-10-CM | POA: Diagnosis not present

## 2017-03-21 DIAGNOSIS — Z992 Dependence on renal dialysis: Secondary | ICD-10-CM | POA: Diagnosis not present

## 2017-03-21 DIAGNOSIS — N186 End stage renal disease: Secondary | ICD-10-CM | POA: Diagnosis not present

## 2017-03-22 DIAGNOSIS — N186 End stage renal disease: Secondary | ICD-10-CM | POA: Diagnosis not present

## 2017-03-22 DIAGNOSIS — N2581 Secondary hyperparathyroidism of renal origin: Secondary | ICD-10-CM | POA: Diagnosis not present

## 2017-03-22 DIAGNOSIS — D631 Anemia in chronic kidney disease: Secondary | ICD-10-CM | POA: Diagnosis not present

## 2017-03-22 DIAGNOSIS — Z992 Dependence on renal dialysis: Secondary | ICD-10-CM | POA: Diagnosis not present

## 2017-03-22 DIAGNOSIS — E1122 Type 2 diabetes mellitus with diabetic chronic kidney disease: Secondary | ICD-10-CM | POA: Diagnosis not present

## 2017-03-24 DIAGNOSIS — D631 Anemia in chronic kidney disease: Secondary | ICD-10-CM | POA: Diagnosis not present

## 2017-03-24 DIAGNOSIS — N186 End stage renal disease: Secondary | ICD-10-CM | POA: Diagnosis not present

## 2017-03-24 DIAGNOSIS — N2581 Secondary hyperparathyroidism of renal origin: Secondary | ICD-10-CM | POA: Diagnosis not present

## 2017-03-24 DIAGNOSIS — Z992 Dependence on renal dialysis: Secondary | ICD-10-CM | POA: Diagnosis not present

## 2017-03-24 DIAGNOSIS — E1122 Type 2 diabetes mellitus with diabetic chronic kidney disease: Secondary | ICD-10-CM | POA: Diagnosis not present

## 2017-03-25 ENCOUNTER — Ambulatory Visit (INDEPENDENT_AMBULATORY_CARE_PROVIDER_SITE_OTHER)
Admission: RE | Admit: 2017-03-25 | Discharge: 2017-03-25 | Disposition: A | Discharge status: Home / Self Care | Payer: Medicare PPO | Source: Ambulatory Visit | Attending: Adult Health | Admitting: Adult Health

## 2017-03-25 ENCOUNTER — Ambulatory Visit (INDEPENDENT_AMBULATORY_CARE_PROVIDER_SITE_OTHER): Payer: Medicare PPO | Admitting: Adult Health

## 2017-03-25 ENCOUNTER — Encounter: Payer: Self-pay | Admitting: Adult Health

## 2017-03-25 VITALS — BP 124/58 | HR 71 | Ht 68.0 in | Wt 150.8 lb

## 2017-03-25 DIAGNOSIS — J9611 Chronic respiratory failure with hypoxia: Secondary | ICD-10-CM

## 2017-03-25 DIAGNOSIS — J449 Chronic obstructive pulmonary disease, unspecified: Secondary | ICD-10-CM | POA: Diagnosis not present

## 2017-03-25 DIAGNOSIS — J189 Pneumonia, unspecified organism: Secondary | ICD-10-CM | POA: Diagnosis not present

## 2017-03-25 NOTE — Progress Notes (Signed)
Chart and office note reviewed in detail  > agree with a/p as outlined    

## 2017-03-25 NOTE — Assessment & Plan Note (Signed)
Cont on o2 .  

## 2017-03-25 NOTE — Progress Notes (Signed)
@Patient  ID: Terry [name]., male    DOB: 07-Feb-1935, 82 y.o.   MRN: [id number]  Chief Complaint  Patient presents with  . Follow-up    COPD     Referring provider: [name], DO  HPI: 82 yo male former smoker followed for GOLD III COPD , chronic resp failure on Oxygen , chronic steroids on prednisone 5mg  daily.  End-stage renal disease on dialysis  TEST  PFT's 01/26/13      FEV1  1.28 (48%) ratio 48 and no better p B2,  DLCO 39 corrects to 48%  - PFTs 02/26/2015   FEV1 0.99  (35 % ) ratio 35  p 11 % improvement from saba with DLCO  24 %    03/25/2017 Follow up ; PNA/Post hospital follow up  Patient returns for a follow-up. Patient was recently hospitalized last week for healthcare associated pneumonia. Patient was treated with IV antibiotics. Blood cultures were negative. Echo showed EF at 97%, mild diastolic dysfunction. Mild LVH. He was discharged on Omnicef. Patient remains on prednisone 5 mg daily. He is on Spiriva daily. Chest x-ray today shows near complete resolution of right sided infiltrate.  He is feeling some better . Remains weak . Still coughing up thick brown mucus .  No fever or chest pain . No n/v./d.    Allergies  Allergen Reactions  . Yellow Dyes (Non-Tartrazine) Other (See Comments)    Burns arms Cough medications (tussionex and benzonatate ok)  . Levaquin [Levofloxacin In D5w] Itching  . Tussionex Pennkinetic Er [Hydrocod Polst-Cpm Polst Er] Other (See Comments)    Unknown on MAR    Immunization History  Administered Date(s) Administered  . Influenza Split 02/21/2012  . Influenza Whole 03/23/2016  . Influenza, High Dose Seasonal PF 03/30/2013  . Influenza,inj,Quad PF,6+ Mos 02/26/2015  . Influenza-Unspecified 04/06/2014  . Pneumococcal Conjugate-13 04/07/2016  . Pneumococcal Polysaccharide-23 02/21/2012    Past Medical History:  Diagnosis Date  . Acute and chronic respiratory failure 10/22/2012  . Anxiety   . Arthritis    "hands, knees"  (03/15/2017)  . Atrial fibrillation ([place])   . CAD (coronary artery disease) [place], [name]  . CHF (congestive heart failure) ([place]) 09/2012  . COPD (chronic obstructive pulmonary disease) ([place])   . ESRD (end stage renal disease) on dialysis [facility])    "MWF; [facility], [place]" (03/15/2017)  . Flu 09/24/2016   influenza b  . HCAP (healthcare-associated pneumonia) 03/15/2017  . High cholesterol   . History of blood transfusion 2012   "related to bowel resection"  . Hypertension   . On home oxygen therapy    "1L typically at night" (03/15/2017)  . Osteoarthritis 10/22/2012  . Pneumonia April 2014; 09/2016  . Shortness of breath dyspnea   . Stroke [facility]) 2012   denies residual on 03/15/2017  . Stroke [facility]) ~ 2013   "small one in his right eye"    Tobacco History: History  Smoking Status  . Former Smoker  . Packs/day: 1.00  . Years: 49.00  . Types: Cigarettes, Pipe, Cigars  . Quit date: 02/21/2000  Smokeless Tobacco  . Former Systems developer  . Types: Chew    Comment: "chewed when he was a kid"   Counseling given: Not Answered   Outpatient Encounter Prescriptions as of 03/25/2017  Medication Sig  . albuterol (PROVENTIL) (2.5 MG/3ML) 0.083% nebulizer solution Take 3 mLs (2.5 mg total) by nebulization every 6 (six) hours as needed for wheezing or shortness of  breath.  [name] aspirin EC 325 MG tablet Take 1 tablet (325 mg total) by mouth daily.  [name] atorvastatin (LIPITOR) 20 MG tablet Take 1 tablet (20 mg total) by mouth daily.  . calcium acetate (PHOSLO) 667 MG capsule Take 667-1,334 mg by mouth 3 (three) times daily with meals.   . cefdinir (OMNICEF) 300 MG capsule Take 1 capsule (300 mg total) by mouth daily.  . cholecalciferol (VITAMIN D) 1000 units tablet Take 1,000 Units by mouth daily.  . methocarbamol (ROBAXIN) 500 MG tablet Take 500 mg by mouth 2 (two) times daily as needed for muscle spasms.  . metoprolol succinate (TOPROL-XL) 50 MG 24 hr tablet Take 50 mg by mouth 2 (two) times  daily. Take with or immediately following a meal.   . midodrine (PROAMATINE) 10 MG tablet Take 10 mg by mouth as needed. At dialysis for low pressure  . multivitamin (RENA-VIT) TABS tablet Take 1 tablet by mouth daily.  . OXYGEN Inhale 1 L into the lungs at bedtime.   . polyvinyl alcohol (LIQUIFILM TEARS) 1.4 % ophthalmic solution Place 1 drop into both eyes 4 (four) times daily as needed for dry eyes.  . predniSONE (DELTASONE) 10 MG tablet Take 0.5 tablets (5 mg total) by mouth daily with breakfast. Take 1/2 tablet by mouth daily (Patient taking differently: Take 10 mg by mouth daily with breakfast. )  . SYMBICORT 160-4.5 MCG/ACT inhaler INHALE 2 PUFFS TWICE DAILY  . tiotropium (SPIRIVA HANDIHALER) 18 MCG inhalation capsule One capsule each am (Patient taking differently: Place 18 mcg into inhaler and inhale every morning. )   No facility-administered encounter medications on file as of 03/25/2017.      Review of Systems  Constitutional:   No  weight loss, night sweats,  Fevers, chills, fatigue, or  lassitude.  HEENT:   No headaches,  Difficulty swallowing,  Tooth/dental problems, or  Sore throat,                No sneezing, itching, ear ache,  +nasal congestion, post nasal drip,   CV:  No chest pain,  Orthopnea, PND, swelling in lower extremities, anasarca, dizziness, palpitations, syncope.   GI  No heartburn, indigestion, abdominal pain, nausea, vomiting, diarrhea, change in bowel habits, loss of appetite, bloody stools.   Resp:    No chest wall deformity  Skin: no rash or lesions.  GU: no dysuria, change in color of urine, no urgency or frequency.  No flank pain, no hematuria   MS:  No joint pain or swelling.  No decreased range of motion.  No back pain.    Physical Exam  BP (!) 124/58 (BP Location: Left Arm, Cuff Size: Normal)   Pulse 71   Ht 5\' 8"  (1.727 m)   Wt 150 lb 12.8 oz (68.4 kg)   SpO2 98%   BMI 22.93 kg/m   GEN: A/Ox3; pleasant , NAD, elderly and frail on O2     HEENT:  [place]/AT,  EACs-clear, TMs-wnl, NOSE-clear, THROAT-clear, no lesions, no postnasal drip or exudate noted.   NECK:  Supple w/ fair ROM; no JVD; normal carotid impulses w/o bruits; no thyromegaly or nodules palpated; no lymphadenopathy.    RESP  Decreased BS in bases ,  no accessory muscle use, no dullness to percussion  CARD:  RRR, no m/r/g, no peripheral edema, pulses intact, no cyanosis or clubbing.  GI:   Soft & nt; nml bowel sounds; no organomegaly or masses detected.   Musco: Warm bil, no deformities or  joint swelling noted.   Neuro: alert, no focal deficits noted.    Skin: Warm, no lesions or rashes    Lab Results:  CBC  BN  Imaging: Dg Chest 2 View  Result Date: 03/25/2017 CLINICAL DATA:  Follow-up pneumonia EXAM: CHEST  2 VIEW COMPARISON:  03/16/2017 FINDINGS: Cardiac shadow is stable. Aortic calcifications are again seen. The lungs are hyperinflated consistent with COPD. Previously seen right basilar infiltrate has nearly completely resolved in the interval. No new focal infiltrate is seen. Degenerative changes of the thoracic spine are noted. IMPRESSION: Near complete resolution of right basilar infiltrate. COPD. Electronically Signed   By: Inez Catalina M.D.   On: 03/25/2017 16:36   Dg Chest 2 View  Result Date: 03/16/2017 CLINICAL DATA:  Pneumonia. Improved breathing today. History of COPD, CHF, and coronary artery disease. EXAM: CHEST  2 VIEW COMPARISON:  PA and lateral chest x-ray of March 15, 2017 FINDINGS: The lungs remain mildly hyperinflated. There has been interval improvement in the appearance of the pulmonary interstitium bilaterally. There is a small right pleural effusion and trace left pleural effusion. The cardiac silhouette is normal. The pulmonary vascularity is not engorged. There is calcification in the wall of the aortic arch. There is multilevel degenerative disc disease of the thoracic spine. IMPRESSION: Improving interstitial infiltrates  bilaterally. Small bilateral pleural effusions. No CHF. Thoracic aortic atherosclerosis. Electronically Signed   By: David  Martinique M.D.   On: 03/16/2017 07:42   Dg Chest 2 View  Result Date: 03/15/2017 CLINICAL DATA:  Cough and shortness of breath.  Weakness. EXAM: CHEST  2 VIEW COMPARISON:  03/11/2017 CT.  Plain film 03/09/2017 FINDINGS: Hyperinflation. Midline trachea. Borderline cardiomegaly. Atherosclerosis in the transverse aorta. Developing small bilateral pleural effusions, greater on the right. No pneumothorax. Patchy right infrahilar and lower lobe airspace disease is new or progressive. Pulmonary interstitial prominence remains. IMPRESSION: Since 03/09/2017, developing right-sided airspace disease and small bilateral pleural effusions. Favor pneumonia. Asymmetric pulmonary edema felt less likely. Borderline cardiomegaly, with transverse aortic atherosclerosis. Electronically Signed   By: Abigail Miyamoto M.D.   On: 03/15/2017 07:51   Dg Chest 2 View  Result Date: 03/09/2017 CLINICAL DATA:  Dyspnea, cough. EXAM: CHEST  2 VIEW COMPARISON:  Radiographs of February 09, 2017. FINDINGS: The heart size and mediastinal contours are within normal limits. Atherosclerosis of thoracic aorta is noted. No pneumothorax or pleural effusion is noted. Stable right basilar scarring is noted. Left upper lobe opacity noted on prior exam is significantly smaller currently. The visualized skeletal structures are unremarkable. IMPRESSION: Stable right basilar scarring. Aortic atherosclerosis. Left upper lobe opacity noted on prior exam is significantly smaller, consistent with resolving inflammation with residual scarring present. Electronically Signed   By: Marijo Conception, M.D.   On: 03/09/2017 14:30   Ct Angio Chest W/cm &/or Wo Cm  Result Date: 03/11/2017 CLINICAL DATA:  Increasing shortness of breath after full a to [place] area last week. Evaluate for pulmonary embolism. History of COPD. End-stage renal disease on  dialysis. EXAM: CT ANGIOGRAPHY CHEST WITH CONTRAST TECHNIQUE: Multidetector CT imaging of the chest was performed using the standard protocol during bolus administration of intravenous contrast. Multiplanar CT image reconstructions and MIPs were obtained to evaluate the vascular anatomy. CONTRAST:  80 mL Isovue 370 IV. COMPARISON:  Chest CT 06/13/2015 and chest x-ray 03/09/2017, 02/09/2017, 02/02/2017 and 09/29/2016 FINDINGS: Cardiovascular: Mild prominence of the heart. Calcified plaque over the left main and 3 vessel coronary arteries. Moderate calcified plaque over the thoracic  aorta. No evidence of pulmonary embolism. Mediastinum/Nodes: No evidence of mediastinal or hilar adenopathy. Remaining mediastinal structures are unremarkable. Lungs/Pleura: Lungs are adequately inflated demonstrate pleural-based density extending in a linear fashion inferiorly over the left apex/upper lobe as this was noted to be improving on the recent chest radiographs. Very few subtle nodular foci of airspace density over the right lung which may be due to an infectious or inflammatory process. Mild posterior bibasilar dependent atelectasis and tiny amount of bilateral pleural fluid. Airways are within normal. Upper Abdomen: Couple adjacent 1 cm hypodensities over the left lobe of the liver unchanged likely cysts. Moderate calcified plaque over the abdominal aorta. Numerous bilateral hypo L1 hyperdense renal cortical lesions likely simple and hemorrhagic cysts. Musculoskeletal: Degenerate change of the spine per Review of the MIP images confirms the above findings. IMPRESSION: No evidence of pulmonary embolism. Pleural based linear density over the left apex demonstrating improvement on recent chest radiographs likely resolving atelectasis/infection. A few scattered subtle nodular airspace densities over the right lung which may be due to infectious or inflammatory process. Mild bibasilar atelectasis and tiny amount of bilateral  pleural fluid. Recommend follow-up chest CT 4-6 weeks. Mild cardiomegaly. Left main and 3 vessel atherosclerotic coronary artery disease. Two stable 1 cm left liver hypodensities likely cysts. Numerous hypo and hyperdense bilateral renal cortical lesions likely simple and hemorrhagic cysts. Recommend follow-up pre and post-contrast CT on elective basis. Aortic Atherosclerosis (ICD10-I70.0). Electronically Signed   By: Marin Olp M.D.   On: 03/11/2017 09:16     Assessment & Plan:   HCAP (healthcare-associated pneumonia) Right sided PNA -clinically improving  CXR shows near resolution   Plan  Patient Instructions  Mckinley Jewel as directed.  Mucinex Twice daily  As needed  Cough/congestion .  Continue on Symbicort and Spiriva .  Continue on oxygen 2 liters Follow up with Dr Melvyn Novas in 3-4 weeks with a chest xray Please contact office for sooner follow up if symptoms do not improve or worsen or seek emergency care       COPD GOLD III Recent flare now resolving  Cont on current regimen .    Chronic hypoxemic respiratory failure ([place]) Cont on o2      Claudis Giovanelli, NP 03/25/2017

## 2017-03-25 NOTE — Assessment & Plan Note (Signed)
Recent flare now resolving  Cont on current regimen  

## 2017-03-25 NOTE — Assessment & Plan Note (Signed)
Right sided PNA -clinically improving  CXR shows near resolution   Plan  Patient Instructions  Terry [name] as directed.  Mucinex Twice daily  As needed  Cough/congestion .  Continue on Symbicort and Spiriva .  Continue on oxygen 2 liters Follow up with Dr Melvyn Novas in 3-4 weeks with a chest xray Please contact office for sooner follow up if symptoms do not improve or worsen or seek emergency care

## 2017-03-25 NOTE — Patient Instructions (Addendum)
[employer] as directed.  Mucinex Twice daily  As needed  Cough/congestion .  Continue on Symbicort and Spiriva .  Continue on oxygen 2 liters Follow up with Dr Melvyn Novas in 3-4 weeks with a chest xray Please contact office for sooner follow up if symptoms do not improve or worsen or seek emergency care

## 2017-03-26 DIAGNOSIS — D631 Anemia in chronic kidney disease: Secondary | ICD-10-CM | POA: Diagnosis not present

## 2017-03-26 DIAGNOSIS — E1122 Type 2 diabetes mellitus with diabetic chronic kidney disease: Secondary | ICD-10-CM | POA: Diagnosis not present

## 2017-03-26 DIAGNOSIS — Z992 Dependence on renal dialysis: Secondary | ICD-10-CM | POA: Diagnosis not present

## 2017-03-26 DIAGNOSIS — N186 End stage renal disease: Secondary | ICD-10-CM | POA: Diagnosis not present

## 2017-03-26 DIAGNOSIS — N2581 Secondary hyperparathyroidism of renal origin: Secondary | ICD-10-CM | POA: Diagnosis not present

## 2017-03-29 DIAGNOSIS — Z992 Dependence on renal dialysis: Secondary | ICD-10-CM | POA: Diagnosis not present

## 2017-03-29 DIAGNOSIS — N186 End stage renal disease: Secondary | ICD-10-CM | POA: Diagnosis not present

## 2017-03-31 DIAGNOSIS — D631 Anemia in chronic kidney disease: Secondary | ICD-10-CM | POA: Diagnosis not present

## 2017-03-31 DIAGNOSIS — N186 End stage renal disease: Secondary | ICD-10-CM | POA: Diagnosis not present

## 2017-03-31 DIAGNOSIS — Z992 Dependence on renal dialysis: Secondary | ICD-10-CM | POA: Diagnosis not present

## 2017-03-31 DIAGNOSIS — N2581 Secondary hyperparathyroidism of renal origin: Secondary | ICD-10-CM | POA: Diagnosis not present

## 2017-03-31 DIAGNOSIS — E1122 Type 2 diabetes mellitus with diabetic chronic kidney disease: Secondary | ICD-10-CM | POA: Diagnosis not present

## 2017-04-02 DIAGNOSIS — Z992 Dependence on renal dialysis: Secondary | ICD-10-CM | POA: Diagnosis not present

## 2017-04-02 DIAGNOSIS — D631 Anemia in chronic kidney disease: Secondary | ICD-10-CM | POA: Diagnosis not present

## 2017-04-02 DIAGNOSIS — E1122 Type 2 diabetes mellitus with diabetic chronic kidney disease: Secondary | ICD-10-CM | POA: Diagnosis not present

## 2017-04-02 DIAGNOSIS — N186 End stage renal disease: Secondary | ICD-10-CM | POA: Diagnosis not present

## 2017-04-02 DIAGNOSIS — N2581 Secondary hyperparathyroidism of renal origin: Secondary | ICD-10-CM | POA: Diagnosis not present

## 2017-04-03 DIAGNOSIS — J449 Chronic obstructive pulmonary disease, unspecified: Secondary | ICD-10-CM | POA: Diagnosis not present

## 2017-04-05 DIAGNOSIS — D631 Anemia in chronic kidney disease: Secondary | ICD-10-CM | POA: Diagnosis not present

## 2017-04-05 DIAGNOSIS — N2581 Secondary hyperparathyroidism of renal origin: Secondary | ICD-10-CM | POA: Diagnosis not present

## 2017-04-05 DIAGNOSIS — Z992 Dependence on renal dialysis: Secondary | ICD-10-CM | POA: Diagnosis not present

## 2017-04-05 DIAGNOSIS — N186 End stage renal disease: Secondary | ICD-10-CM | POA: Diagnosis not present

## 2017-04-05 DIAGNOSIS — E1122 Type 2 diabetes mellitus with diabetic chronic kidney disease: Secondary | ICD-10-CM | POA: Diagnosis not present

## 2017-04-06 ENCOUNTER — Encounter: Payer: Medicare PPO | Admitting: Adult Health

## 2017-04-07 DIAGNOSIS — D631 Anemia in chronic kidney disease: Secondary | ICD-10-CM | POA: Diagnosis not present

## 2017-04-07 DIAGNOSIS — Z992 Dependence on renal dialysis: Secondary | ICD-10-CM | POA: Diagnosis not present

## 2017-04-07 DIAGNOSIS — N2581 Secondary hyperparathyroidism of renal origin: Secondary | ICD-10-CM | POA: Diagnosis not present

## 2017-04-07 DIAGNOSIS — E1122 Type 2 diabetes mellitus with diabetic chronic kidney disease: Secondary | ICD-10-CM | POA: Diagnosis not present

## 2017-04-07 DIAGNOSIS — N186 End stage renal disease: Secondary | ICD-10-CM | POA: Diagnosis not present

## 2017-04-09 DIAGNOSIS — N186 End stage renal disease: Secondary | ICD-10-CM | POA: Diagnosis not present

## 2017-04-09 DIAGNOSIS — E1122 Type 2 diabetes mellitus with diabetic chronic kidney disease: Secondary | ICD-10-CM | POA: Diagnosis not present

## 2017-04-09 DIAGNOSIS — Z992 Dependence on renal dialysis: Secondary | ICD-10-CM | POA: Diagnosis not present

## 2017-04-09 DIAGNOSIS — N2581 Secondary hyperparathyroidism of renal origin: Secondary | ICD-10-CM | POA: Diagnosis not present

## 2017-04-09 DIAGNOSIS — D631 Anemia in chronic kidney disease: Secondary | ICD-10-CM | POA: Diagnosis not present

## 2017-04-12 DIAGNOSIS — Z992 Dependence on renal dialysis: Secondary | ICD-10-CM | POA: Diagnosis not present

## 2017-04-12 DIAGNOSIS — N2581 Secondary hyperparathyroidism of renal origin: Secondary | ICD-10-CM | POA: Diagnosis not present

## 2017-04-12 DIAGNOSIS — D631 Anemia in chronic kidney disease: Secondary | ICD-10-CM | POA: Diagnosis not present

## 2017-04-12 DIAGNOSIS — N186 End stage renal disease: Secondary | ICD-10-CM | POA: Diagnosis not present

## 2017-04-12 DIAGNOSIS — E1122 Type 2 diabetes mellitus with diabetic chronic kidney disease: Secondary | ICD-10-CM | POA: Diagnosis not present

## 2017-04-14 DIAGNOSIS — E1122 Type 2 diabetes mellitus with diabetic chronic kidney disease: Secondary | ICD-10-CM | POA: Diagnosis not present

## 2017-04-14 DIAGNOSIS — N2581 Secondary hyperparathyroidism of renal origin: Secondary | ICD-10-CM | POA: Diagnosis not present

## 2017-04-14 DIAGNOSIS — N186 End stage renal disease: Secondary | ICD-10-CM | POA: Diagnosis not present

## 2017-04-14 DIAGNOSIS — D631 Anemia in chronic kidney disease: Secondary | ICD-10-CM | POA: Diagnosis not present

## 2017-04-14 DIAGNOSIS — Z992 Dependence on renal dialysis: Secondary | ICD-10-CM | POA: Diagnosis not present

## 2017-04-16 DIAGNOSIS — Z992 Dependence on renal dialysis: Secondary | ICD-10-CM | POA: Diagnosis not present

## 2017-04-16 DIAGNOSIS — N186 End stage renal disease: Secondary | ICD-10-CM | POA: Diagnosis not present

## 2017-04-16 DIAGNOSIS — N2581 Secondary hyperparathyroidism of renal origin: Secondary | ICD-10-CM | POA: Diagnosis not present

## 2017-04-16 DIAGNOSIS — D631 Anemia in chronic kidney disease: Secondary | ICD-10-CM | POA: Diagnosis not present

## 2017-04-16 DIAGNOSIS — E1122 Type 2 diabetes mellitus with diabetic chronic kidney disease: Secondary | ICD-10-CM | POA: Diagnosis not present

## 2017-04-19 DIAGNOSIS — N2581 Secondary hyperparathyroidism of renal origin: Secondary | ICD-10-CM | POA: Diagnosis not present

## 2017-04-19 DIAGNOSIS — E1122 Type 2 diabetes mellitus with diabetic chronic kidney disease: Secondary | ICD-10-CM | POA: Diagnosis not present

## 2017-04-19 DIAGNOSIS — Z992 Dependence on renal dialysis: Secondary | ICD-10-CM | POA: Diagnosis not present

## 2017-04-19 DIAGNOSIS — N186 End stage renal disease: Secondary | ICD-10-CM | POA: Diagnosis not present

## 2017-04-19 DIAGNOSIS — D631 Anemia in chronic kidney disease: Secondary | ICD-10-CM | POA: Diagnosis not present

## 2017-04-20 ENCOUNTER — Ambulatory Visit (INDEPENDENT_AMBULATORY_CARE_PROVIDER_SITE_OTHER): Payer: Medicare PPO | Admitting: Internal Medicine

## 2017-04-20 ENCOUNTER — Encounter: Payer: Self-pay | Admitting: Internal Medicine

## 2017-04-20 VITALS — BP 130/60 | HR 63 | Ht 68.0 in | Wt 152.0 lb

## 2017-04-20 DIAGNOSIS — J9611 Chronic respiratory failure with hypoxia: Secondary | ICD-10-CM

## 2017-04-20 DIAGNOSIS — R918 Other nonspecific abnormal finding of lung field: Secondary | ICD-10-CM

## 2017-04-20 DIAGNOSIS — J449 Chronic obstructive pulmonary disease, unspecified: Secondary | ICD-10-CM

## 2017-04-20 NOTE — Progress Notes (Signed)
Subjective:    Patient ID: Terry [name], male    DOB: Nov 10, 1934    MRN: [id number]   Brief patient profile:  60 yowm quit smoking 2001 bothered by doe seemed better then worse in 2010 p "lung infection" and stayed on advair/spiriva  Combo since 2012 seemed better then admitted in [facility] with pna 10/09/12  in then transferred to [facility] x 3 weeks and discharged  On May 1st  2014 referred to pulmonary clinic 11/29/12  by [name] for copd eval and proved to have GOLD III copd 01/26/13 with symptoms much better off ACEi.    History of Present Illness 08/27/2015  Extended post hosp /transition of care  f/u ov/Terry [name] re: COPD GOLD III recurrent admits/ now chronic resp failure/HD dep CRI plus GOLD III rx symb/spriiva/neb saba Chief Complaint  Patient presents with  . Follow-up    Pt c/o occasional cough with white mucus. Pt denies wheeze/increased SOB/CP/tightness. Pt is on O2/2L, he states that on 3L it dries out his nose and burns. Pt also c/o low BP readings and wonders if some of his HTN meds need to be changed.   now sleeping in a recliner since admit 30-45 degrees due to sob/some noct cough but excess/ purulent sputum or mucus plugs   On prednisone with floor of   @ 10 mg since d/c 08/19/15  rec May need to consider adding Oracit to your regimen and I will le Dr Terry [name] know You will need to adjust your 02 to saturations of over 90%  Best fit and humidified 02 ordered  Plan A = Automatic = Symbicort 160 2 in am and 2 in pm with spiriva each am and prednisone 10 mg with breakfast daily  Work on inhaler technique:  Plan B = Backup Only use your albuterol (proventil) as a rescue medication Plan C = Crisis - only use your albuterol nebulizer if you first try Plan B and it fails to help > ok to use the nebulizer up to every 4 hours but if start needing it regularly call for immediate appointment    01/07/2016  f/u ov/Terry [name] re:  Copd GOLD III/ rx 5 prednisone / symb/spiriva and rare saba   (maybe once a week)  02 hs only  Chief Complaint  Patient presents with  . Follow-up    pt states breathing is baseline since last OV. c/o sob & non prod cough.  02 1lpm hs rarely during the day / c/o dry mouth ? From spiriva dpi Doe = MMRC3 = can't walk 100 yards even at a slow pace at a flat grade s stopping due to sob   rec Plan A = Automatic = symbicort 160 Take 2 puffs first thing in am and then another 2 puffs about 12 hours later and spiriva 1.25 x 4 or 2.5 x 2puffs in am only  Plan B = Backup Only use your albuterol (ventolin) as a rescue medication  Plan C = Crisis - only use your albuterol nebulizer if you first try Plan B and it fails to help > ok to use the nebulizer up to every 4 hours but if start needing it regularly call for immediate appointment Prednisone 5 mg daily   Please schedule a follow up visit in 3 months but call sooner if needed     09/29/16 NP rec Begin Tudorza 1 puff Twice daily , brush rinse and gargle after use. > bothered mouth  Finish Tamiflu and Cipro .  Taper  Prednisone as directed to 10mg  1/2 daily .  Follow med calendar closely and bring to each visit.       12/10/2016  f/u ov/Terry [name] re: GOLD III copd symbicort 160 2bid / still on prednisone 5 mg daily - 02 hs only x 1lpm  Chief Complaint  Patient presents with  . Follow-up    Pt stopped the Todurza due to it making his mouth hurt, Pt states his breathing is doing well, he still slight cough but non productive, Denies wheezing,chest tightness   mb and back s stopping which is def better and no change off lama and off amb 02  rec Follow med calendar and bring to each ov    02/02/17 acute ov/Terry [name] with worse breathing for sev days assoc with L cp rec pred taper zpak for L apex as dz    02/09/2017 acute extended ov/Terry [name] re: f/u pna/ worse sob on symb 160 2bid maint and pred 5 mg daily  Chief Complaint  Patient presents with  . Acute Visit    Pt states 2 nights ago he got up to use the  bathroom and then could not catch his breath- neb was taken and this helped.    L cp resolved p zpak but now doe x room to room not monitoring 02 with activity  rec Plan A = Automatic = symbicort 160 Take 2 puffs first thing in am/ chase with spiriva one capsule  and then another 2 puffs of symbicort  about 12 hours later.  Also automatically wear the 02 at bedtime then during the day as needed to  keep your saturations above 90% at all times  Plan B = Backup Only use your albuterol as a rescue medication Plan C = Crisis - only use your albuterol nebulizer if you first try Plan B and it fails to help > ok to use the nebulizer up to every 4 hours but if start needing it regularly call for immediate appointment Please remember to go to the  x-ray department downstairs in the basement  for your tests - we will call you with the results when they are available. Cancel follow up with Terry Form NP and keep appt to see me 03/09/17 - bring your updated med calendar with you  Add:  cipro 750 bid x 10 days and repeat cxr at next ov      03/09/2017  f/u ov/Terry [name] re:   LUL pna/ copd III/ 02 dep resp failure / new ex cp / did not bring med calendar, cannot recall action plans / main rx symb/spiriva/ pred at 5 mg daily  Chief Complaint  Patient presents with  . Follow-up    CXR repeated today. Pt c/o increased SOB and non prod cough x 5 days. He has CP that comes and goes "it's a muscle"- occurs with exertion and subsides with rest.    new pattern cp with exertion over the last month/ can't tell any difference on vs off 02 - discomfort is similar to prev angina but no longer uses ntg prn and not sure he even has it, no assoc pleuritic cp/ no nausea/vomiting/diaph and never at rest or noct - resolves in a few min at rest. Using 02 1lpm hs and prn daytime to keep 02  Acutely worse doe x 5 days with dry cough No worse before vs p HD and no assoc leg swelling rec See calendar for specific medication  instructions and bring it back for each and every office  visit for every healthcare provider you see.  Without it,  you may not receive the best quality medical care that we feel you deserve.  You will note that the calendar groups together  your maintenance  medications that are timed at particular times of the day.  Think of this as your checklist for what your doctor has instructed you to do until your next evaluation to see what benefit  there is  to staying on a consistent group of medications intended to keep you well.  The other group at the bottom is entirely up to you to use as you see fit  for specific symptoms that may arise between visits that require you to treat them on an as needed basis.  Think of this as your action plan or "what if" list.   Separating the top medications from the bottom group is fundamental to providing you adequate care going forward.    When breathing is getting worse and needing your rescue inhaler more often than twice daily then double prednisone until better then back to one half daily   Adjust your 02 to keep your saturations above 90% at all times     04/20/2017  f/u ov/Terry [name] re:  Copd /  02 1lpm at hs and never needing otherwise / still on pred 5 mg daily  Chief Complaint  Patient presents with  . Follow-up    Pt states that he has been doing good. Denies any c/o cough, SOB, or CP.   walking at a slow pace s 02 and says sats stay  in low 90s Doe = MMRC2 = can't walk a nl pace on a flat grade s sob but does fine slow and flat eg shopping    Rare need for saba at all / no med calendar " I drove my wife's car" and the calendar is in the other car  No obvious day to day or daytime variability or assoc excess/ purulent sputum or mucus plugs or hemoptysis or cp or chest tightness, subjective wheeze or overt sinus or hb symptoms. No unusual exp hx or h/o childhood pna/ asthma or knowledge of premature birth.  Sleeping ok flat without nocturnal  or early am  exacerbation  of respiratory  c/o's or need for noct saba. Also denies any obvious fluctuation of symptoms with weather or environmental changes or other aggravating or alleviating factors except as outlined above   Current Allergies, Complete Past Medical History, Past Surgical History, Family History, and Social History were reviewed in [employer] record.  ROS  The following are not active complaints unless bolded Hoarseness, sore throat, dysphagia, dental problems, itching, sneezing,  nasal congestion or discharge of excess mucus or purulent secretions, ear ache,   fever, chills, sweats, unintended wt loss or wt gain, classically pleuritic or exertional cp,  orthopnea pnd or leg swelling, presyncope, palpitations, abdominal pain, anorexia, nausea, vomiting, diarrhea  or change in bowel habits or change in bladder habits, change in stools or change in urine, dysuria, hematuria,  rash, arthralgias, visual complaints, headache, numbness, weakness or ataxia or problems with walking or coordination,  change in mood/affect or memory.        Current Meds  Medication Sig  . albuterol (PROVENTIL) (2.5 MG/3ML) 0.083% nebulizer solution Take 3 mLs (2.5 mg total) by nebulization every 6 (six) hours as needed for wheezing or shortness of breath.  [name] aspirin EC 325 MG tablet Take 1 tablet (325 mg total) by mouth daily.  [name]  atorvastatin (LIPITOR) 20 MG tablet Take 1 tablet (20 mg total) by mouth daily.  . calcium acetate (PHOSLO) 667 MG capsule Take 667-1,334 mg by mouth 3 (three) times daily with meals.   . cholecalciferol (VITAMIN D) 1000 units tablet Take 1,000 Units by mouth daily.  . methocarbamol (ROBAXIN) 500 MG tablet Take 500 mg by mouth 2 (two) times daily as needed for muscle spasms.  . metoprolol succinate (TOPROL-XL) 50 MG 24 hr tablet Take 50 mg by mouth 2 (two) times daily. Take with or immediately following a meal.   . midodrine (PROAMATINE) 10 MG tablet Take 10 mg by mouth  as needed. At dialysis for low pressure  . multivitamin (RENA-VIT) TABS tablet Take 1 tablet by mouth daily.  . OXYGEN Inhale 1 L into the lungs at bedtime.   . predniSONE (DELTASONE) 10 MG tablet Take 0.5 tablets (5 mg total) by mouth daily with breakfast. Take 1/2 tablet by mouth daily (Patient taking differently: Take 10 mg by mouth daily with breakfast. )  . SYMBICORT 160-4.5 MCG/ACT inhaler INHALE 2 PUFFS TWICE DAILY  . tiotropium (SPIRIVA HANDIHALER) 18 MCG inhalation capsule One capsule each am (Patient taking differently: Place 18 mcg into inhaler and inhale every morning. )  . [DISCONTINUED] polyvinyl alcohol (LIQUIFILM TEARS) 1.4 % ophthalmic solution Place 1 drop into both eyes 4 (four) times daily as needed for dry eyes.                               Objective:   Physical Exam  Somber   wm nad / much brighter affect   02/26/2015          210 >  08/27/2015 163 p HD > 09/26/2015  159 > 11/26/2015  160 > 01/07/2016  159 > 04/07/2016  159 > 05/06/2016>   07/21/2016  157 > 08/13/2016  152  > 12/10/2016  150 > 02/09/2017  152 > 03/09/2017  156 > 04/20/2017  152     Vital signs reviewed   -  - Note on arrival 02 sats 95% on RA       HEENT: nl dentition, and oropharynx. Nl external ear canals without cough reflex - mild  bilateral non-specific turbinate edema    NECK :  without JVD/Nodes/TM/ nl carotid upstrokes bilaterally   LUNGS: no acc muscle use,  Moderately Barrel chest, decreased BS  s localized or gen wheeze    CV:  RRR  no s3 or murmur or increase in P2, no edema   ABD:  soft and nontender with nl inspiratory excursion in the supine position. No bruits or organomegaly, bowel sounds nl  MS:  Nl gait/ ext warm without deformities, calf tenderness, cyanosis or clubbing No obvious joint restrictions   SKIN: warm and dry without lesions    NEURO:  alert, approp, nl sensorium with  no motor deficits           I personally reviewed images and agree with radiology  impression as follows:  CXR:   03/25/17  Near complete resolution of right basilar infiltrate.  COPD.

## 2017-04-20 NOTE — Patient Instructions (Addendum)
Ok to try prednisone 5 mg one half every other day to see if it makes any diffrence and if not stay on this dose   See calendar for specific medication instructions and bring it back for each and every office visit for every healthcare provider you see.  Without it,  you may not receive the best quality medical care that we feel you deserve.  You will note that the calendar groups together  your maintenance  medications that are timed at particular times of the day.  Think of this as your checklist for what your doctor has instructed you to do until your next evaluation to see what benefit  there is  to staying on a consistent group of medications intended to keep you well.  The other group at the bottom is entirely up to you to use as you see fit  for specific symptoms that may arise between visits that require you to treat them on an as needed basis.  Think of this as your action plan or "what if" list.   Separating the top medications from the bottom group is fundamental to providing you adequate care going forward.     Please schedule a follow up visit in 3 months but call sooner if needed with medication calendar in hand or all your medications

## 2017-04-21 DIAGNOSIS — Z992 Dependence on renal dialysis: Secondary | ICD-10-CM | POA: Diagnosis not present

## 2017-04-21 DIAGNOSIS — D631 Anemia in chronic kidney disease: Secondary | ICD-10-CM | POA: Diagnosis not present

## 2017-04-21 DIAGNOSIS — N186 End stage renal disease: Secondary | ICD-10-CM | POA: Diagnosis not present

## 2017-04-21 DIAGNOSIS — E1122 Type 2 diabetes mellitus with diabetic chronic kidney disease: Secondary | ICD-10-CM | POA: Diagnosis not present

## 2017-04-21 DIAGNOSIS — N2581 Secondary hyperparathyroidism of renal origin: Secondary | ICD-10-CM | POA: Diagnosis not present

## 2017-04-21 NOTE — Assessment & Plan Note (Signed)
02 rx as of  hosp discharge 08/14/15   As of 04/20/2017 =  1lpm HS and prn daytime to keep sats > 90% , advised

## 2017-04-21 NOTE — Assessment & Plan Note (Addendum)
-   Trial off ACEi 11/30/2012 >>> much better symptom control - 05/28/2014   Walked RA x one lap @ 185 stopped due to  [employer] / sats ok/ mod pace - PFT's 01/26/13      FEV1  1.28 (48%) ratio 48 and no better p B2,  DLCO 39 corrects to 48%  - PFTs 02/26/2015   FEV1 0.99  (35 % ) ratio 35  p 11 % improvement from saba with DLCO  24 % corrects to 35  % for alv volume  p am symb/spiriva    - Pred maint rx  Since d/c 08/14/15  - 09/26/2015  extensive coaching HFA effectiveness =    75%  - 11/26/2015 rec reduce pred to 5 mg daily   - 05/22/2016  After extensive coaching HFA effectiveness =  90%  - 07/21/2016 try off spiriva due to throat irritation > restarted at rec of wife during cough but did not help it so d/c'd again 08/13/2016 with plan to change to spriva smi if breathing worse  - 02/09/2017 added spiriva   - 04/20/2017  After extensive coaching HFA effectiveness =    90%    Group D in terms of symptom/risk and laba/lama/ICS  therefore appropriate rx at this point > continue present rx but try to wean pred to 2.5 mg qod if tol using the lowest possible dowe    I had an extended discussion with the patient reviewing all relevant studies completed to date and  lasting 15 to 20 minutes of a 25 minute visit    Each maintenance medication was reviewed in detail including most importantly the difference between maintenance and prns and under what circumstances the prns are to be triggered using an action plan format that is not reflected in the computer generated alphabetically organized AVS but trather by a customized med calendar that reflects the AVS meds with confirmed 100% correlation.   In addition, Please see AVS for unique instructions that I personally wrote and verbalized to the the pt in detail and then reviewed with pt  by my nurse highlighting any  changes in therapy recommended at today's visit to their plan of care.

## 2017-04-21 NOTE — Assessment & Plan Note (Signed)
New on 02/02/17 since 09/29/16 with transient L cp improved p zpak - 02/09/2017 rec esr, cbc with diff and cipro 750 bid x 10 days in view of h/o GNR pna's  - Quant Gold TB  02/11/17  Neg/  ESR  48  - cxr 03/09/2017 much better, no further abx indicated  - cxr 03/25/17 almost complete resolution > no further f/u indicated

## 2017-04-23 DIAGNOSIS — N186 End stage renal disease: Secondary | ICD-10-CM | POA: Diagnosis not present

## 2017-04-23 DIAGNOSIS — N2581 Secondary hyperparathyroidism of renal origin: Secondary | ICD-10-CM | POA: Diagnosis not present

## 2017-04-26 DIAGNOSIS — N186 End stage renal disease: Secondary | ICD-10-CM | POA: Diagnosis not present

## 2017-04-26 DIAGNOSIS — N2581 Secondary hyperparathyroidism of renal origin: Secondary | ICD-10-CM | POA: Diagnosis not present

## 2017-04-28 DIAGNOSIS — N186 End stage renal disease: Secondary | ICD-10-CM | POA: Diagnosis not present

## 2017-04-28 DIAGNOSIS — N2581 Secondary hyperparathyroidism of renal origin: Secondary | ICD-10-CM | POA: Diagnosis not present

## 2017-04-30 DIAGNOSIS — N186 End stage renal disease: Secondary | ICD-10-CM | POA: Diagnosis not present

## 2017-04-30 DIAGNOSIS — N2581 Secondary hyperparathyroidism of renal origin: Secondary | ICD-10-CM | POA: Diagnosis not present

## 2017-05-03 DIAGNOSIS — N2581 Secondary hyperparathyroidism of renal origin: Secondary | ICD-10-CM | POA: Diagnosis not present

## 2017-05-03 DIAGNOSIS — N186 End stage renal disease: Secondary | ICD-10-CM | POA: Diagnosis not present

## 2017-05-04 DIAGNOSIS — J449 Chronic obstructive pulmonary disease, unspecified: Secondary | ICD-10-CM | POA: Diagnosis not present

## 2017-05-05 DIAGNOSIS — N2581 Secondary hyperparathyroidism of renal origin: Secondary | ICD-10-CM | POA: Diagnosis not present

## 2017-05-05 DIAGNOSIS — N186 End stage renal disease: Secondary | ICD-10-CM | POA: Diagnosis not present

## 2017-05-07 DIAGNOSIS — N186 End stage renal disease: Secondary | ICD-10-CM | POA: Diagnosis not present

## 2017-05-07 DIAGNOSIS — N2581 Secondary hyperparathyroidism of renal origin: Secondary | ICD-10-CM | POA: Diagnosis not present

## 2017-05-10 DIAGNOSIS — N186 End stage renal disease: Secondary | ICD-10-CM | POA: Diagnosis not present

## 2017-05-10 DIAGNOSIS — N2581 Secondary hyperparathyroidism of renal origin: Secondary | ICD-10-CM | POA: Diagnosis not present

## 2017-05-11 DIAGNOSIS — N2581 Secondary hyperparathyroidism of renal origin: Secondary | ICD-10-CM | POA: Diagnosis not present

## 2017-05-11 DIAGNOSIS — N186 End stage renal disease: Secondary | ICD-10-CM | POA: Diagnosis not present

## 2017-05-14 DIAGNOSIS — N2581 Secondary hyperparathyroidism of renal origin: Secondary | ICD-10-CM | POA: Diagnosis not present

## 2017-05-14 DIAGNOSIS — N186 End stage renal disease: Secondary | ICD-10-CM | POA: Diagnosis not present

## 2017-05-17 DIAGNOSIS — N186 End stage renal disease: Secondary | ICD-10-CM | POA: Diagnosis not present

## 2017-05-17 DIAGNOSIS — N2581 Secondary hyperparathyroidism of renal origin: Secondary | ICD-10-CM | POA: Diagnosis not present

## 2017-05-19 ENCOUNTER — Telehealth: Payer: Self-pay | Admitting: Internal Medicine

## 2017-05-19 DIAGNOSIS — N186 End stage renal disease: Secondary | ICD-10-CM | POA: Diagnosis not present

## 2017-05-19 DIAGNOSIS — N2581 Secondary hyperparathyroidism of renal origin: Secondary | ICD-10-CM | POA: Diagnosis not present

## 2017-05-19 NOTE — Telephone Encounter (Signed)
Spoke with Magda Paganini and she ok's to fit pt in for tomorrow. Spoke with pt and he will come in at 4pm. Nothing further is needed.

## 2017-05-20 ENCOUNTER — Ambulatory Visit (INDEPENDENT_AMBULATORY_CARE_PROVIDER_SITE_OTHER)
Admission: RE | Admit: 2017-05-20 | Discharge: 2017-05-20 | Disposition: A | Discharge status: Home / Self Care | Payer: Medicare PPO | Source: Ambulatory Visit | Attending: Internal Medicine | Admitting: Internal Medicine

## 2017-05-20 ENCOUNTER — Ambulatory Visit (INDEPENDENT_AMBULATORY_CARE_PROVIDER_SITE_OTHER): Payer: Medicare PPO | Admitting: Internal Medicine

## 2017-05-20 ENCOUNTER — Encounter: Payer: Self-pay | Admitting: Internal Medicine

## 2017-05-20 DIAGNOSIS — J449 Chronic obstructive pulmonary disease, unspecified: Secondary | ICD-10-CM

## 2017-05-20 DIAGNOSIS — R05 Cough: Secondary | ICD-10-CM | POA: Diagnosis not present

## 2017-05-20 DIAGNOSIS — J9611 Chronic respiratory failure with hypoxia: Secondary | ICD-10-CM

## 2017-05-20 MED ORDER — ACLIDINIUM BROMIDE 400 MCG/ACT IN AEPB: 2.0000 | INHALATION_SPRAY | Freq: Every day | RESPIRATORY_TRACT | 0 refills | Status: DC

## 2017-05-20 MED ORDER — AZITHROMYCIN 250 MG PO TABS: ORAL_TABLET | ORAL | 0 refills | Status: DC

## 2017-05-20 MED ORDER — PREDNISONE 10 MG PO TABS: 5.0000 mg | ORAL_TABLET | Freq: Every day | ORAL | 2 refills | Status: DC

## 2017-05-20 NOTE — Patient Instructions (Addendum)
zpak > call if fever or nasty sputum that doesn't respond to this   If short of breath, more wheezy and you already have  followed plan  [facility]  >  D = deltasone 5 mg x 4 until better then taper by 1 pill every 2 days until back to one half daily    See calendar for specific medication instructions and bring it back for each and every office visit for every healthcare provider you see.  Without it,  you may not receive the best quality medical care that we feel you deserve.  You will note that the calendar groups together  your maintenance  medications that are timed at particular times of the day.  Think of this as your checklist for what your doctor has instructed you to do until your next evaluation to see what benefit  there is  to staying on a consistent group of medications intended to keep you well.  The other group at the bottom is entirely up to you to use as you see fit  for specific symptoms that may arise between visits that require you to treat them on an as needed basis.  Think of this as your action plan or "what if" list.   Separating the top medications from the bottom group is fundamental to providing you adequate care going forward.    When you return as planned bring your calendar dated today and all your medications

## 2017-05-20 NOTE — Progress Notes (Signed)
Subjective:    Patient ID: Terry [name], male    DOB: July 14, 1934    MRN: [id number]   Brief patient profile:  40 yowm quit smoking 2001 bothered by doe seemed better then worse in 2010 p "lung infection" and stayed on advair/spiriva  Combo since 2012 seemed better then admitted in [facility] with pna 10/09/12  in then transferred to [facility] x 3 weeks and discharged  On May 1st  2014 referred to pulmonary clinic 11/29/12  by [facility] for copd eval and proved to have GOLD III copd 01/26/13 with symptoms much better off ACEi.    History of Present Illness 08/27/2015  Extended post hosp /transition of care  f/u ov/Hue Steveson re: COPD GOLD III recurrent admits/ now chronic resp failure/HD dep CRI plus GOLD III rx symb/spriiva/neb saba Chief Complaint  Patient presents with  . Follow-up    Pt c/o occasional cough with white mucus. Pt denies wheeze/increased SOB/CP/tightness. Pt is on O2/2L, he states that on 3L it dries out his nose and burns. Pt also c/o low BP readings and wonders if some of his HTN meds need to be changed.   now sleeping in a recliner since admit 30-45 degrees due to sob/some noct cough but excess/ purulent sputum or mucus plugs   On prednisone with floor of   @ 10 mg since d/c 08/19/15  rec May need to consider adding Oracit to your regimen and I will le Dr Lorrene Reid know You will need to adjust your 02 to saturations of over 90%  Best fit and humidified 02 ordered  Plan A = Automatic = Symbicort 160 2 in am and 2 in pm with spiriva each am and prednisone 10 mg with breakfast daily  Work on inhaler technique:  Plan B = Backup Only use your albuterol (proventil) as a rescue medication Plan C = Crisis - only use your albuterol nebulizer if you first try Plan B and it fails to help > ok to use the nebulizer up to every 4 hours but if start needing it regularly call for immediate appointment    01/07/2016  f/u ov/Najae Filsaime re:  Copd GOLD III/ rx 5 prednisone / symb/spiriva and rare saba   (maybe once a week)  02 hs only  Chief Complaint  Patient presents with  . Follow-up    pt states breathing is baseline since last OV. c/o sob & non prod cough.  02 1lpm hs rarely during the day / c/o dry mouth ? From spiriva dpi Doe = MMRC3 = can't walk 100 yards even at a slow pace at a flat grade s stopping due to sob   rec Plan A = Automatic = symbicort 160 Take 2 puffs first thing in am and then another 2 puffs about 12 hours later and spiriva 1.25 x 4 or 2.5 x 2puffs in am only  Plan B = Backup Only use your albuterol (ventolin) as a rescue medication  Plan C = Crisis - only use your albuterol nebulizer if you first try Plan B and it fails to help > ok to use the nebulizer up to every 4 hours but if start needing it regularly call for immediate appointment Prednisone 5 mg daily   Please schedule a follow up visit in 3 months but call sooner if needed     09/29/16 NP rec Begin Tudorza 1 puff Twice daily , brush rinse and gargle after use. > bothered mouth  Finish Tamiflu and Cipro .  Taper  Prednisone as directed to 10mg  1/2 daily .  Follow med calendar closely and bring to each visit.       12/10/2016  f/u ov/Tyri Elmore re: GOLD III copd symbicort 160 2bid / still on prednisone 5 mg daily - 02 hs only x 1lpm  Chief Complaint  Patient presents with  . Follow-up    Pt stopped the Todurza due to it making his mouth hurt, Pt states his breathing is doing well, he still slight cough but non productive, Denies wheezing,chest tightness   mb and back s stopping which is def better and no change off lama and off amb 02  rec Follow med calendar and bring to each ov    02/02/17 acute ov/[name] with worse breathing for sev days assoc with L cp rec pred taper zpak for L apex as dz    02/09/2017 acute extended ov/Breena Bevacqua re: f/u pna/ worse sob on symb 160 2bid maint and pred 5 mg daily  Chief Complaint  Patient presents with  . Acute Visit    Pt states 2 nights ago he got up to use the  bathroom and then could not catch his breath- neb was taken and this helped.    L cp resolved p zpak but now doe x room to room not monitoring 02 with activity  rec Plan A = Automatic = symbicort 160 Take 2 puffs first thing in am/ chase with spiriva one capsule  and then another 2 puffs of symbicort  about 12 hours later.  Also automatically wear the 02 at bedtime then during the day as needed to  keep your saturations above 90% at all times  Plan B = Backup Only use your albuterol as a rescue medication Plan C = Crisis - only use your albuterol nebulizer if you first try Plan B and it fails to help > ok to use the nebulizer up to every 4 hours but if start needing it regularly call for immediate appointment Please remember to go to the  x-ray department downstairs in the basement  for your tests - we will call you with the results when they are available. Cancel follow up with Eric Form NP and keep appt to see me 03/09/17 - bring your updated med calendar with you  Add:  cipro 750 bid x 10 days and repeat cxr at next ov      03/09/2017  f/u ov/Phong Isenberg re:   LUL pna/ copd III/ 02 dep resp failure / new ex cp / did not bring med calendar, cannot recall action plans / main rx symb/spiriva/ pred at 5 mg daily  Chief Complaint  Patient presents with  . Follow-up    CXR repeated today. Pt c/o increased SOB and non prod cough x 5 days. He has CP that comes and goes "it's a muscle"- occurs with exertion and subsides with rest.    new pattern cp with exertion over the last month/ can't tell any difference on vs off 02 - discomfort is similar to prev angina but no longer uses ntg prn and not sure he even has it, no assoc pleuritic cp/ no nausea/vomiting/diaph and never at rest or noct - resolves in a few min at rest. Using 02 1lpm hs and prn daytime to keep 02  Acutely worse doe x 5 days with dry cough No worse before vs p HD and no assoc leg swelling rec See calendar for specific medication  instructions and bring it back for each and every office  visit for every healthcare provider you see.  Without it,  you may not receive the best quality medical care that we feel you deserve.  You will note that the calendar groups together  your maintenance  medications that are timed at particular times of the day.  Think of this as your checklist for what your doctor has instructed you to do until your next evaluation to see what benefit  there is  to staying on a consistent group of medications intended to keep you well.  The other group at the bottom is entirely up to you to use as you see fit  for specific symptoms that may arise between visits that require you to treat them on an as needed basis.  Think of this as your action plan or "what if" list.   Separating the top medications from the bottom group is fundamental to providing you adequate care going forward.    When breathing is getting worse and needing your rescue inhaler more often than twice daily then double prednisone until better then back to one half daily   Adjust your 02 to keep your saturations above 90% at all times     04/20/2017  f/u ov/Mikias Lanz re:  Copd /  02 1lpm at hs and never needing otherwise / still on pred 5 mg daily  Chief Complaint  Patient presents with  . Follow-up    Pt states that he has been doing good. Denies any c/o cough, SOB, or CP.   walking at a slow pace s 02 and says sats stay  in low 90s Doe = MMRC2 = can't walk a nl pace on a flat grade s sob but does fine slow and flat eg shopping Rare need for saba at all / no med calendar " I drove my wife's car" and the calendar is in the other car rec Ok to try prednisone 5 mg one half every other day to see if it makes any diffrence and if not stay on this dose  See calendar for specific medication instructions and bring it back for each and every office visit for every healthcare provider you see.  Without it,  you may not receive the best quality medical  care that we feel you deserve.       05/20/2017 acute extended ov/Gerri Acre re: cough / never tried to wean pred < 5mg   One half daily  Chief Complaint  Patient presents with  . Acute Visit    Pt is not feeling well over last week, having productive cough- yellow thick mucus. Pt has SOB with exertion.    Onset 05/15/17 acute cough mostly dry / more sob than usual but not to point where needed to increase saba assoc with congested cough with thick yellow mucus esp in am   No obvious day to day or daytime variability or assoc  mucus plugs or hemoptysis or cp or chest tightness, subjective wheeze or overt sinus or hb symptoms. No unusual exposure hx or h/o childhood pna/ asthma or knowledge of premature birth.  Sleeping ok flat without nocturnal  or early am exacerbation  of respiratory  c/o's or need for noct saba. Also denies any obvious fluctuation of symptoms with weather or environmental changes or other aggravating or alleviating factors except as outlined above   Current Allergies, Complete Past Medical History, Past Surgical History, Family History, and Social History were reviewed in [employer] record.  ROS  The following are not active complaints  unless bolded Hoarseness, sore throat, dysphagia, dental problems, itching, sneezing,  nasal congestion or discharge of excess mucus or purulent secretions, ear ache,   fever, chills, sweats, unintended wt loss or wt gain, classically pleuritic or exertional cp,  orthopnea pnd or leg swelling, presyncope, palpitations, abdominal pain, anorexia, nausea, vomiting, diarrhea  or change in bowel habits or change in bladder habits, change in stools or change in urine, dysuria, hematuria,  rash, arthralgias, visual complaints, headache, numbness, weakness or ataxia or problems with walking or coordination,  change in mood/affect or memory.        Current Meds - unable to verify, no med calendar   Medication Sig  . albuterol  (PROVENTIL) (2.5 MG/3ML) 0.083% nebulizer solution Take 3 mLs (2.5 mg total) by nebulization every 6 (six) hours as needed for wheezing or shortness of breath.  [name] aspirin EC 325 MG tablet Take 1 tablet (325 mg total) by mouth daily.  [name] atorvastatin (LIPITOR) 20 MG tablet Take 1 tablet (20 mg total) by mouth daily.  . calcium acetate (PHOSLO) 667 MG capsule Take 667-1,334 mg by mouth 3 (three) times daily with meals.   . cholecalciferol (VITAMIN D) 1000 units tablet Take 1,000 Units by mouth daily.  . methocarbamol (ROBAXIN) 500 MG tablet Take 500 mg by mouth 2 (two) times daily as needed for muscle spasms.  . metoprolol succinate (TOPROL-XL) 50 MG 24 hr tablet Take 50 mg by mouth 2 (two) times daily. Take with or immediately following a meal.   . midodrine (PROAMATINE) 10 MG tablet Take 10 mg by mouth as needed. At dialysis for low pressure  . multivitamin (RENA-VIT) TABS tablet Take 1 tablet by mouth daily.  . OXYGEN Inhale 1 L into the lungs at bedtime.   . predniSONE (DELTASONE) 10 MG tablet Take 0.5 tablets (5 mg total) by mouth daily with breakfast. Take 1/2 tablet by mouth daily  . SYMBICORT 160-4.5 MCG/ACT inhaler INHALE 2 PUFFS TWICE DAILY  . [DISCONTINUED] predniSONE (DELTASONE) 10 MG tablet Take 0.5 tablets (5 mg total) by mouth daily with breakfast. Take 1/2 tablet by mouth daily (Patient taking differently: Take 10 mg by mouth daily with breakfast. )  .  tudorza One puff bid                        Objective:   Physical Exam  [place] wm/ nad   02/26/2015          210 >  08/27/2015 163 p HD > 09/26/2015  159 > 11/26/2015  160 > 01/07/2016  159 > 04/07/2016  159 > 05/06/2016>   07/21/2016  157 > 08/13/2016  152  > 12/10/2016  150 > 02/09/2017  152 > 03/09/2017  156 > 04/20/2017  152 > 05/20/2017  152    Vital signs reviewed - Note on arrival 02 sats  91% on RA          LUNGS: no acc muscle use,  Moderately Barrel chest, decreased BS  s localized or gen wheeze     HEENT: nl  dentition, turbinates bilaterally, and oropharynx. Nl external ear canals without cough reflex   NECK :  without JVD/Nodes/TM/ nl carotid upstrokes bilaterally   LUNGS: no acc muscle use,  Mod barrel chest,  Decreased bs with distant mid exp wheeze bilaterally    CV:  RRR  no s3 or murmur or increase in P2, and no edema   ABD:  soft and nontender with nl inspiratory  excursion in the supine position. No bruits or organomegaly appreciated, bowel sounds nl  MS:  Nl gait/ ext warm without deformities, calf tenderness, cyanosis or clubbing No obvious joint restrictions   SKIN: warm and dry without lesions    NEURO:  alert, approp, nl sensorium with  no motor or cerebellar deficits apparent.             CXR PA and Lateral:   05/20/2017 :    I personally reviewed images and agree with radiology impression as follows:   Stable changes of COPD and right basilar atelectasis without acute cardiopulmonary disease. My impression:  The atx mentioned is minimal/ plate like and chronic

## 2017-05-21 DIAGNOSIS — E1122 Type 2 diabetes mellitus with diabetic chronic kidney disease: Secondary | ICD-10-CM | POA: Diagnosis not present

## 2017-05-21 DIAGNOSIS — Z992 Dependence on renal dialysis: Secondary | ICD-10-CM | POA: Diagnosis not present

## 2017-05-21 DIAGNOSIS — N186 End stage renal disease: Secondary | ICD-10-CM | POA: Diagnosis not present

## 2017-05-21 DIAGNOSIS — N2581 Secondary hyperparathyroidism of renal origin: Secondary | ICD-10-CM | POA: Diagnosis not present

## 2017-05-24 ENCOUNTER — Encounter: Payer: Self-pay | Admitting: Internal Medicine

## 2017-05-24 DIAGNOSIS — N186 End stage renal disease: Secondary | ICD-10-CM | POA: Diagnosis not present

## 2017-05-24 DIAGNOSIS — D631 Anemia in chronic kidney disease: Secondary | ICD-10-CM | POA: Diagnosis not present

## 2017-05-24 DIAGNOSIS — N2581 Secondary hyperparathyroidism of renal origin: Secondary | ICD-10-CM | POA: Diagnosis not present

## 2017-05-24 NOTE — Assessment & Plan Note (Signed)
02 rx as of  hosp discharge 08/14/15   As of 05/20/2017 =  1lpm HS and prn daytime to keep sats > 90%

## 2017-05-24 NOTE — Assessment & Plan Note (Addendum)
-   Trial off ACEi 11/30/2012 >>> much better symptom control - 05/28/2014   Walked RA x one lap @ 185 stopped due to  [employer] / sats ok/ mod pace - PFT's 01/26/13      FEV1  1.28 (48%) ratio 48 and no better p B2,  DLCO 39 corrects to 48%  - PFTs 02/26/2015   FEV1 0.99  (35 % ) ratio 35  p 11 % improvement from saba with DLCO  24 % corrects to 35  % for alv volume  p am symb/spiriva    - Pred maint rx  Since d/c 08/14/15  - 09/26/2015  extensive coaching HFA effectiveness =    75%  - 11/26/2015 rec reduce pred to 5 mg daily   - 05/22/2016  After extensive coaching HFA effectiveness =  90%  - 07/21/2016 try off spiriva due to throat irritation > restarted at rec of wife during cough but did not help it so d/c'd again 08/13/2016 with plan to change to spriva smi if breathing worse  - 02/09/2017 added spiriva  - 04/20/2017  After extensive coaching HFA effectiveness =    90%   Mild flare with poor use of med calendar action plan which already lists the meds he needs in max doses to get thru a mild exacerbation, which is what this appears to be   For now rx zpak/ Prednisone 20 mg daily until better then taper to 2.5 qd as floor for now  I had an extended discussion with the patient/wife reviewing all relevant studies completed to date and  lasting 15 to 20 minutes of a 25 minute acute office visit    Each maintenance medication was reviewed in detail including most importantly the difference between maintenance and prns and under what circumstances the prns are to be triggered using an action plan format that is not reflected in the computer generated alphabetically organized AVS but trather by a customized med calendar that reflects the AVS meds with confirmed 100% correlation.   In addition, Please see AVS for unique instructions that I personally wrote and verbalized to the the pt in detail and then reviewed with pt  by my nurse highlighting any  changes in therapy recommended at today's visit to their plan of care.

## 2017-05-25 ENCOUNTER — Ambulatory Visit: Payer: Medicare PPO | Admitting: Internal Medicine

## 2017-05-26 DIAGNOSIS — N186 End stage renal disease: Secondary | ICD-10-CM | POA: Diagnosis not present

## 2017-05-26 DIAGNOSIS — D631 Anemia in chronic kidney disease: Secondary | ICD-10-CM | POA: Diagnosis not present

## 2017-05-26 DIAGNOSIS — N2581 Secondary hyperparathyroidism of renal origin: Secondary | ICD-10-CM | POA: Diagnosis not present

## 2017-05-28 DIAGNOSIS — D631 Anemia in chronic kidney disease: Secondary | ICD-10-CM | POA: Diagnosis not present

## 2017-05-28 DIAGNOSIS — N186 End stage renal disease: Secondary | ICD-10-CM | POA: Diagnosis not present

## 2017-05-28 DIAGNOSIS — N2581 Secondary hyperparathyroidism of renal origin: Secondary | ICD-10-CM | POA: Diagnosis not present

## 2017-05-31 DIAGNOSIS — N2581 Secondary hyperparathyroidism of renal origin: Secondary | ICD-10-CM | POA: Diagnosis not present

## 2017-05-31 DIAGNOSIS — N186 End stage renal disease: Secondary | ICD-10-CM | POA: Diagnosis not present

## 2017-05-31 DIAGNOSIS — D631 Anemia in chronic kidney disease: Secondary | ICD-10-CM | POA: Diagnosis not present

## 2017-06-02 DIAGNOSIS — N2581 Secondary hyperparathyroidism of renal origin: Secondary | ICD-10-CM | POA: Diagnosis not present

## 2017-06-02 DIAGNOSIS — D631 Anemia in chronic kidney disease: Secondary | ICD-10-CM | POA: Diagnosis not present

## 2017-06-02 DIAGNOSIS — N186 End stage renal disease: Secondary | ICD-10-CM | POA: Diagnosis not present

## 2017-06-03 DIAGNOSIS — J449 Chronic obstructive pulmonary disease, unspecified: Secondary | ICD-10-CM | POA: Diagnosis not present

## 2017-06-04 DIAGNOSIS — N186 End stage renal disease: Secondary | ICD-10-CM | POA: Diagnosis not present

## 2017-06-04 DIAGNOSIS — D631 Anemia in chronic kidney disease: Secondary | ICD-10-CM | POA: Diagnosis not present

## 2017-06-04 DIAGNOSIS — N2581 Secondary hyperparathyroidism of renal origin: Secondary | ICD-10-CM | POA: Diagnosis not present

## 2017-06-07 DIAGNOSIS — N2581 Secondary hyperparathyroidism of renal origin: Secondary | ICD-10-CM | POA: Diagnosis not present

## 2017-06-07 DIAGNOSIS — D631 Anemia in chronic kidney disease: Secondary | ICD-10-CM | POA: Diagnosis not present

## 2017-06-07 DIAGNOSIS — N186 End stage renal disease: Secondary | ICD-10-CM | POA: Diagnosis not present

## 2017-06-09 DIAGNOSIS — D631 Anemia in chronic kidney disease: Secondary | ICD-10-CM | POA: Diagnosis not present

## 2017-06-09 DIAGNOSIS — N2581 Secondary hyperparathyroidism of renal origin: Secondary | ICD-10-CM | POA: Diagnosis not present

## 2017-06-09 DIAGNOSIS — N186 End stage renal disease: Secondary | ICD-10-CM | POA: Diagnosis not present

## 2017-06-11 ENCOUNTER — Ambulatory Visit: Payer: Medicare PPO | Admitting: Podiatry

## 2017-06-11 DIAGNOSIS — N186 End stage renal disease: Secondary | ICD-10-CM | POA: Diagnosis not present

## 2017-06-11 DIAGNOSIS — N2581 Secondary hyperparathyroidism of renal origin: Secondary | ICD-10-CM | POA: Diagnosis not present

## 2017-06-11 DIAGNOSIS — D631 Anemia in chronic kidney disease: Secondary | ICD-10-CM | POA: Diagnosis not present

## 2017-06-14 ENCOUNTER — Other Ambulatory Visit: Payer: Self-pay | Admitting: Internal Medicine

## 2017-06-14 DIAGNOSIS — N186 End stage renal disease: Secondary | ICD-10-CM | POA: Diagnosis not present

## 2017-06-14 DIAGNOSIS — N2581 Secondary hyperparathyroidism of renal origin: Secondary | ICD-10-CM | POA: Diagnosis not present

## 2017-06-14 DIAGNOSIS — D631 Anemia in chronic kidney disease: Secondary | ICD-10-CM | POA: Diagnosis not present

## 2017-06-16 ENCOUNTER — Telehealth: Payer: Self-pay | Admitting: Internal Medicine

## 2017-06-16 DIAGNOSIS — D631 Anemia in chronic kidney disease: Secondary | ICD-10-CM | POA: Diagnosis not present

## 2017-06-16 DIAGNOSIS — N186 End stage renal disease: Secondary | ICD-10-CM | POA: Diagnosis not present

## 2017-06-16 DIAGNOSIS — N2581 Secondary hyperparathyroidism of renal origin: Secondary | ICD-10-CM | POA: Diagnosis not present

## 2017-06-16 MED ORDER — ACLIDINIUM BROMIDE 400 MCG/ACT IN AEPB: 2.0000 | INHALATION_SPRAY | Freq: Every day | RESPIRATORY_TRACT | 5 refills | Status: DC

## 2017-06-16 MED ORDER — ACLIDINIUM BROMIDE 400 MCG/ACT IN AEPB: 2.0000 | INHALATION_SPRAY | Freq: Every day | RESPIRATORY_TRACT | 0 refills | Status: DC

## 2017-06-16 NOTE — Telephone Encounter (Signed)
Called and spoke with pt's wife, Thayer Headings letting her know I was placing samples of Tudorza up front for them to pick up and sending in a Rx to pharmacy of choice. Thayer Headings expressed understanding. Nothing further needed.

## 2017-06-17 ENCOUNTER — Telehealth: Payer: Self-pay | Admitting: Internal Medicine

## 2017-06-17 MED ORDER — PREDNISONE 5 MG PO TABS: ORAL_TABLET | ORAL | 0 refills | Status: DC

## 2017-06-17 MED ORDER — AZITHROMYCIN 250 MG PO TABS: ORAL_TABLET | ORAL | 0 refills | Status: DC

## 2017-06-17 NOTE — Telephone Encounter (Signed)
Per MW verbally- 20mg  x 1 week, 10mg  x 1 week. 5mg  x 1 week then 0.5 daily until f/u. Pt is aware of recommendations and voiced his understanding. Rx for zpak and prednisone has been sent to preferred pharmacy. Nothing further is needed.

## 2017-06-17 NOTE — Telephone Encounter (Signed)
rx zpak/ Prednisone 20 mg daily until better then taper to 2.5 qd as floor

## 2017-06-17 NOTE — Telephone Encounter (Signed)
Pt reports of prod cough with white mucus & increased sob x2d Denies fever, chills or sweat.  Pt is requesting abx, as he feels that he is coming down with something.   MW please advise. Thanks.   05/20/17 AVS  zpak > call if fever or nasty sputum that doesn't respond to this   If short of breath, more wheezy and you already have  followed plan  ABC  >  D = deltasone 5 mg x 4 until better then taper by 1 pill every 2 days until back to one half daily    See calendar for specific medication instructions and bring it back for each and every office visit for every healthcare provider you see.  Without it,  you may not receive the best quality medical care that we feel you deserve.  You will note that the calendar groups together  your maintenance  medications that are timed at particular times of the day.  Think of this as your checklist for what your doctor has instructed you to do until your next evaluation to see what benefit  there is  to staying on a consistent group of medications intended to keep you well.  The other group at the bottom is entirely up to you to use as you see fit  for specific symptoms that may arise between visits that require you to treat them on an as needed basis.  Think of this as your action plan or "what if" list.   Separating the top medications from the bottom group is fundamental to providing you adequate care going forward.    When you return as planned bring your calendar dated today and all your medications

## 2017-06-18 DIAGNOSIS — N186 End stage renal disease: Secondary | ICD-10-CM | POA: Diagnosis not present

## 2017-06-18 DIAGNOSIS — D631 Anemia in chronic kidney disease: Secondary | ICD-10-CM | POA: Diagnosis not present

## 2017-06-18 DIAGNOSIS — N2581 Secondary hyperparathyroidism of renal origin: Secondary | ICD-10-CM | POA: Diagnosis not present

## 2017-06-20 ENCOUNTER — Emergency Department (HOSPITAL_COMMUNITY): Payer: Medicare PPO

## 2017-06-20 ENCOUNTER — Inpatient Hospital Stay (HOSPITAL_COMMUNITY)
Admission: EM | Admit: 2017-06-20 | Discharge: 2017-06-22 | DRG: 871 | Disposition: A | Discharge status: Home / Self Care | Payer: Medicare PPO | Attending: Internal Medicine | Admitting: Internal Medicine

## 2017-06-20 ENCOUNTER — Encounter (HOSPITAL_COMMUNITY): Payer: Self-pay

## 2017-06-20 ENCOUNTER — Other Ambulatory Visit: Payer: Self-pay

## 2017-06-20 DIAGNOSIS — R0902 Hypoxemia: Secondary | ICD-10-CM

## 2017-06-20 DIAGNOSIS — D638 Anemia in other chronic diseases classified elsewhere: Secondary | ICD-10-CM | POA: Diagnosis present

## 2017-06-20 DIAGNOSIS — I10 Essential (primary) hypertension: Secondary | ICD-10-CM | POA: Diagnosis not present

## 2017-06-20 DIAGNOSIS — J441 Chronic obstructive pulmonary disease with (acute) exacerbation: Secondary | ICD-10-CM | POA: Diagnosis present

## 2017-06-20 DIAGNOSIS — I482 Chronic atrial fibrillation, unspecified: Secondary | ICD-10-CM | POA: Diagnosis present

## 2017-06-20 DIAGNOSIS — Z881 Allergy status to other antibiotic agents status: Secondary | ICD-10-CM

## 2017-06-20 DIAGNOSIS — J440 Chronic obstructive pulmonary disease with acute lower respiratory infection: Secondary | ICD-10-CM | POA: Diagnosis present

## 2017-06-20 DIAGNOSIS — Z961 Presence of intraocular lens: Secondary | ICD-10-CM | POA: Diagnosis present

## 2017-06-20 DIAGNOSIS — Z8249 Family history of ischemic heart disease and other diseases of the circulatory system: Secondary | ICD-10-CM | POA: Diagnosis not present

## 2017-06-20 DIAGNOSIS — M19041 Primary osteoarthritis, right hand: Secondary | ICD-10-CM | POA: Diagnosis present

## 2017-06-20 DIAGNOSIS — I5032 Chronic diastolic (congestive) heart failure: Secondary | ICD-10-CM | POA: Diagnosis present

## 2017-06-20 DIAGNOSIS — Z9842 Cataract extraction status, left eye: Secondary | ICD-10-CM | POA: Diagnosis not present

## 2017-06-20 DIAGNOSIS — Z992 Dependence on renal dialysis: Secondary | ICD-10-CM

## 2017-06-20 DIAGNOSIS — Z9841 Cataract extraction status, right eye: Secondary | ICD-10-CM | POA: Diagnosis not present

## 2017-06-20 DIAGNOSIS — M19042 Primary osteoarthritis, left hand: Secondary | ICD-10-CM | POA: Diagnosis present

## 2017-06-20 DIAGNOSIS — I120 Hypertensive chronic kidney disease with stage 5 chronic kidney disease or end stage renal disease: Secondary | ICD-10-CM | POA: Diagnosis not present

## 2017-06-20 DIAGNOSIS — E7800 Pure hypercholesterolemia, unspecified: Secondary | ICD-10-CM | POA: Diagnosis present

## 2017-06-20 DIAGNOSIS — N186 End stage renal disease: Secondary | ICD-10-CM | POA: Diagnosis present

## 2017-06-20 DIAGNOSIS — Z87891 Personal history of nicotine dependence: Secondary | ICD-10-CM

## 2017-06-20 DIAGNOSIS — J209 Acute bronchitis, unspecified: Secondary | ICD-10-CM | POA: Diagnosis present

## 2017-06-20 DIAGNOSIS — E1122 Type 2 diabetes mellitus with diabetic chronic kidney disease: Secondary | ICD-10-CM | POA: Diagnosis not present

## 2017-06-20 DIAGNOSIS — Z803 Family history of malignant neoplasm of breast: Secondary | ICD-10-CM

## 2017-06-20 DIAGNOSIS — M170 Bilateral primary osteoarthritis of knee: Secondary | ICD-10-CM | POA: Diagnosis present

## 2017-06-20 DIAGNOSIS — A419 Sepsis, unspecified organism: Principal | ICD-10-CM | POA: Diagnosis present

## 2017-06-20 DIAGNOSIS — J9621 Acute and chronic respiratory failure with hypoxia: Secondary | ICD-10-CM | POA: Diagnosis present

## 2017-06-20 DIAGNOSIS — M898X9 Other specified disorders of bone, unspecified site: Secondary | ICD-10-CM | POA: Diagnosis present

## 2017-06-20 DIAGNOSIS — J189 Pneumonia, unspecified organism: Secondary | ICD-10-CM

## 2017-06-20 DIAGNOSIS — Z888 Allergy status to other drugs, medicaments and biological substances status: Secondary | ICD-10-CM

## 2017-06-20 DIAGNOSIS — B342 Coronavirus infection, unspecified: Secondary | ICD-10-CM | POA: Diagnosis not present

## 2017-06-20 DIAGNOSIS — Z9981 Dependence on supplemental oxygen: Secondary | ICD-10-CM | POA: Diagnosis not present

## 2017-06-20 DIAGNOSIS — I2510 Atherosclerotic heart disease of native coronary artery without angina pectoris: Secondary | ICD-10-CM | POA: Diagnosis present

## 2017-06-20 DIAGNOSIS — Z7982 Long term (current) use of aspirin: Secondary | ICD-10-CM | POA: Diagnosis not present

## 2017-06-20 DIAGNOSIS — Z955 Presence of coronary angioplasty implant and graft: Secondary | ICD-10-CM | POA: Diagnosis not present

## 2017-06-20 DIAGNOSIS — N2581 Secondary hyperparathyroidism of renal origin: Secondary | ICD-10-CM | POA: Diagnosis present

## 2017-06-20 DIAGNOSIS — B9729 Other coronavirus as the cause of diseases classified elsewhere: Secondary | ICD-10-CM | POA: Diagnosis present

## 2017-06-20 DIAGNOSIS — I132 Hypertensive heart and chronic kidney disease with heart failure and with stage 5 chronic kidney disease, or end stage renal disease: Secondary | ICD-10-CM | POA: Diagnosis present

## 2017-06-20 DIAGNOSIS — R05 Cough: Secondary | ICD-10-CM | POA: Diagnosis present

## 2017-06-20 DIAGNOSIS — Z820 Family history of epilepsy and other diseases of the nervous system: Secondary | ICD-10-CM

## 2017-06-20 DIAGNOSIS — Z8673 Personal history of transient ischemic attack (TIA), and cerebral infarction without residual deficits: Secondary | ICD-10-CM | POA: Diagnosis not present

## 2017-06-20 DIAGNOSIS — D631 Anemia in chronic kidney disease: Secondary | ICD-10-CM | POA: Diagnosis not present

## 2017-06-20 LAB — CBC WITH DIFFERENTIAL/PLATELET
BASOS PCT: 0 %
EOS PCT: 0 %
HCT: 35.9 % — ABNORMAL LOW (ref 39.0–52.0)
HEMOGLOBIN: 11.5 g/dL — AB (ref 13.0–17.0)
LYMPHS PCT: 8 %
MCH: 33.2 pg (ref 26.0–34.0)
MCHC: 32 g/dL (ref 30.0–36.0)
MCV: 103.8 fL — ABNORMAL HIGH (ref 78.0–100.0)
MONOS PCT: 9 %
NEUTROS PCT: 83 %
Platelets: 255 10*3/uL (ref 150–400)
RBC: 3.46 MIL/uL — ABNORMAL LOW (ref 4.22–5.81)
RDW: 16.8 % — AB (ref 11.5–15.5)
WBC: 16.6 10*3/uL — ABNORMAL HIGH (ref 4.0–10.5)

## 2017-06-20 LAB — BASIC METABOLIC PANEL
ANION GAP: 11 (ref 5–15)
BUN: 23 mg/dL — ABNORMAL HIGH (ref 6–20)
CALCIUM: 8.4 mg/dL — AB (ref 8.9–10.3)
CHLORIDE: 94 mmol/L — AB (ref 101–111)
CO2: 32 mmol/L (ref 22–32)
Creatinine, Ser: 3.45 mg/dL — ABNORMAL HIGH (ref 0.61–1.24)
GFR calc non Af Amer: 15 mL/min — ABNORMAL LOW (ref >60)
GFR, EST AFRICAN AMERICAN: 18 mL/min — AB (ref >60)
Glucose, Bld: 112 mg/dL — ABNORMAL HIGH (ref 65–99)
POTASSIUM: 3.5 mmol/L (ref 3.5–5.1)
Sodium: 137 mmol/L (ref 135–145)

## 2017-06-20 MED ORDER — DEXTROSE 5 % IV SOLN
1.0000 g | Freq: Once | INTRAVENOUS | Status: AC
  Administered 2017-06-20: 1 g via INTRAVENOUS
  Filled 2017-06-20: qty 10

## 2017-06-20 MED ORDER — IPRATROPIUM-ALBUTEROL 0.5-2.5 (3) MG/3ML IN SOLN
3.0000 mL | Freq: Once | RESPIRATORY_TRACT | Status: AC
  Administered 2017-06-20: 3 mL via RESPIRATORY_TRACT
  Filled 2017-06-20: qty 3

## 2017-06-20 MED ORDER — DEXTROSE 5 % IV SOLN
500.0000 mg | Freq: Once | INTRAVENOUS | Status: AC
  Administered 2017-06-20: 500 mg via INTRAVENOUS
  Filled 2017-06-20: qty 500

## 2017-06-20 NOTE — ED Provider Notes (Signed)
[facility] EMERGENCY DEPARTMENT Provider Note   CSN: [id number] Arrival date & time: 06/20/17  1652     History   Chief Complaint Chief Complaint  Patient presents with  . Cough    HPI Terry [name]. is a 82 y.o. male.  Terry [name]. Is a 82 y.o. Male with a history of COPD and end-stage renal disease on dialysis who presents to the emerge cough ongoing for the past week.  Reports been productive and gradually worsening over the past week.  He reports feeling slightly more short of breath with exertion today than normal.  He always has some shortness of breath.  He is oxygen at home at night.  No oxygen during the day.  He last received dialysis today.  He is on dialysis scheduled Monday, Wednesday and Friday.  He reports associated sneezing, runny nose and postnasal drip.  He denies fevers, chest pain, hemoptysis, leg pain, leg swelling, abdominal pain, vomiting.    The history is provided by the patient and medical records. No language interpreter was used.  Cough  Associated symptoms include rhinorrhea and shortness of breath. Pertinent negatives include no chest pain, no chills, no headaches, no sore throat and no wheezing.    Past Medical History:  Diagnosis Date  . Acute and chronic respiratory failure 10/22/2012  . Anxiety   . Arthritis    "hands, knees" (03/15/2017)  . Atrial fibrillation ([place])   . CAD (coronary artery disease) [place], [name]  . CHF (congestive heart failure) ([place]) 09/2012  . COPD (chronic obstructive pulmonary disease) ([place])   . ESRD (end stage renal disease) on dialysis [facility])    "MWF; [facility], [place]" (03/15/2017)  . Flu 09/24/2016   influenza b  . HCAP (healthcare-associated pneumonia) 03/15/2017  . High cholesterol   . History of blood transfusion 2012   "related to bowel resection"  . Hypertension   . On home oxygen therapy    "1L typically at night" (03/15/2017)  . Osteoarthritis 10/22/2012  .  Pneumonia April 2014; 09/2016  . Shortness of breath dyspnea   . Stroke [facility]) 2012   denies residual on 03/15/2017  . Stroke [facility]) ~ 2013   "small one in his right eye"    Patient Active Problem List   Diagnosis Date Noted  . COPD with acute exacerbation ([place]) 06/21/2017  . Hypertension 03/15/2017  . Chest pain 03/15/2017  . High cholesterol 03/15/2017  . CKD (chronic kidney disease) stage V requiring chronic dialysis ([place]) 03/15/2017  . COPD (chronic obstructive pulmonary disease) GOLD stage III 03/15/2017  . Chronic hypoxemic respiratory failure ([place]) 03/15/2017  . PAF (paroxysmal atrial fibrillation) ([place]) 03/15/2017  . Acute on chronic respiratory failure with hypoxia ([place])   . Pulmonary infiltrates on CXR 02/10/2017  . Upper airway cough syndrome 08/13/2016  . Memory loss of unknown cause 04/08/2016  . Chronic respiratory failure with hypoxia ([place]) 08/27/2015  . ESRD (end stage renal disease) on dialysis ([place]) 07/28/2015  . HCAP (healthcare-associated pneumonia)   . Pneumothorax, left   . Chronic diastolic heart failure ([place])   . Anemia of chronic disease   . Pneumonia due to Enterobacter cloacae ([place])   . Pneumonia due to Klebsiella pneumoniae ([place])   . Chronic atrial fibrillation ([place])   . CAD (coronary artery disease) 02/04/2015  . Hyperkalemia 04/13/2014  . COPD GOLD III 10/22/2012  . Other and unspecified hyperlipidemia 10/22/2012  . Osteoarthritis 10/22/2012  . Essential hypertension  10/22/2012    Past Surgical History:  Procedure Laterality Date  . APPENDECTOMY  1984  . AV FISTULA PLACEMENT Right 05/21/2015   Procedure: Right Arm Brachiocephalic ARTERIOVENOUS (AV) FISTULA CREATION;  Surgeon: Angelia Mould, MD;  Location: [facility];  Service: Vascular;  Laterality: Right;  . CATARACT EXTRACTION W/ INTRAOCULAR LENS  IMPLANT, BILATERAL Bilateral   . COLECTOMY  2012   partial colon removed - twisted bowel  . CORONARY ANGIOPLASTY WITH STENT PLACEMENT  1999   2  stents  . CYSTOSCOPY/RETROGRADE/URETEROSCOPY Bilateral 04/23/2014   Procedure: CYSTOSCOPY BILATERAL RETROGRADE,  LEFT URETEROSCOPY, LEFT STENT PLACEMENT;  Surgeon: Raynelle Bring, MD;  Location: [facility];  Service: Urology;  Laterality: Bilateral;  . EYE SURGERY Left    "surgery to clean off lens after cataract OR"  . INGUINAL HERNIA REPAIR Right        Home Medications    Prior to Admission medications   Medication Sig Start Date End Date Taking? Authorizing Provider  Aclidinium Bromide (TUDORZA PRESSAIR) 400 MCG/ACT AEPB Inhale 2 puffs into the lungs daily. Patient taking differently: Inhale 1 puff into the lungs 2 (two) times daily.  06/16/17  Yes Tanda Rockers, MD  albuterol (PROVENTIL) (2.5 MG/3ML) 0.083% nebulizer solution Take 3 mLs (2.5 mg total) by nebulization every 6 (six) hours as needed for wheezing or shortness of breath. 03/18/17  Yes Elwin Mocha, MD  aspirin EC 325 MG tablet Take 1 tablet (325 mg total) by mouth daily. 09/26/15  Yes Burnell Blanks, MD  atorvastatin (LIPITOR) 20 MG tablet Take 1 tablet (20 mg total) by mouth daily. 09/21/16  Yes Burnell Blanks, MD  azithromycin (ZITHROMAX) 250 MG tablet Take 2 on day one then 1 daily x 4 days Patient taking differently: Take 250-500 mg by mouth See admin instructions. Take 2 on day one then 1 daily x 4 days 05/20/17  Yes Tanda Rockers, MD  calcium acetate (PHOSLO) 667 MG capsule Take 667-1,334 mg by mouth 3 (three) times daily with meals.  01/09/16  Yes [provider]  cholecalciferol (VITAMIN D) 1000 units tablet Take 1,000 Units by mouth daily.   Yes [provider]  ethyl chloride spray Apply 1 application topically See admin instructions. For dialysis 05/28/17  Yes [provider]  methocarbamol (ROBAXIN) 500 MG tablet Take 500 mg by mouth 2 (two) times daily as needed for muscle spasms.   Yes [provider]  metoprolol succinate (TOPROL-XL) 50 MG 24 hr tablet Take 50  mg by mouth 2 (two) times daily. Take with or immediately following a meal.    Yes [provider]  midodrine (PROAMATINE) 10 MG tablet Take 10 mg by mouth as needed. At dialysis for low pressure   Yes [provider]  multivitamin (RENA-VIT) TABS tablet Take 1 tablet by mouth daily.   Yes [provider]  predniSONE (DELTASONE) 5 MG tablet 20mg  (4tabs) daily until better then then 10mg  x 1 wk, 5mg  x 1 wk then 0.5 until f/u Patient taking differently: Take 20 mg by mouth daily with breakfast.  06/17/17  Yes Tanda Rockers, MD  SYMBICORT 160-4.5 MCG/ACT inhaler INHALE TWO PUFFS BY MOUTH TWICE DAILY 06/14/17  Yes Tanda Rockers, MD  azithromycin (ZITHROMAX) 250 MG tablet Take 2 tablets (500 mg) on  Day 1,  followed by 1 tablet (250 mg) once daily on Days 2 through 5. Patient not taking: Reported on 06/21/2017 06/17/17 06/22/17  Tanda Rockers, MD  OXYGEN  Inhale 1 L into the lungs at bedtime.     [provider]  predniSONE (DELTASONE) 10 MG tablet Take 0.5 tablets (5 mg total) by mouth daily with breakfast. Take 1/2 tablet by mouth daily Patient not taking: Reported on 06/21/2017 05/20/17   Tanda Rockers, MD  SYMBICORT 160-4.5 MCG/ACT inhaler INHALE 2 PUFFS TWICE DAILY Patient not taking: Reported on 06/21/2017 11/30/16   Tanda Rockers, MD    Family History Family History  Problem Relation Age of Onset  . Cancer Mother        Breast  . Heart disease Father   . Heart attack Father   . Parkinson's disease Brother     Social History Social History   Tobacco Use  . Smoking status: Former Smoker    Packs/day: 1.00    Years: 49.00    Pack years: 49.00    Types: Cigarettes, Pipe, Cigars    Last attempt to quit: 02/21/2000    Years since quitting: 17.3  . Smokeless tobacco: Former Systems developer    Types: Chew  . Tobacco comment: "chewed when he was a kid"  Substance Use Topics  . Alcohol use: Yes    Comment: 03/15/2017 "nothing since the 1960's"  . Drug  use: No     Allergies   Yellow dyes (non-tartrazine); Levaquin [levofloxacin in d5w]; and Tussionex pennkinetic er [employer] er]   Review of Systems Review of Systems  Constitutional: Negative for chills and fever.  HENT: Positive for congestion, postnasal drip, rhinorrhea and sneezing. Negative for sore throat and trouble swallowing.   Eyes: Negative for visual disturbance.  Respiratory: Positive for cough and shortness of breath. Negative for wheezing.   Cardiovascular: Negative for chest pain and palpitations.  Gastrointestinal: Negative for abdominal pain, diarrhea, nausea and vomiting.  Genitourinary: Negative for dysuria.  Musculoskeletal: Negative for back pain and neck pain.  Skin: Negative for rash.  Neurological: Negative for syncope, light-headedness and headaches.     Physical Exam Updated Vital Signs BP (!) 145/52   Pulse 88   Temp 98.1 F (36.7 C) (Axillary)   Resp (!) 21   Ht 5\' 8"  (1.727 m)   Wt 69.9 kg (154 lb)   SpO2 96%   BMI 23.42 kg/m   Physical Exam  Constitutional: He appears well-developed and well-nourished. No distress.  HENT:  Head: Normocephalic and atraumatic.  Mouth/Throat: Oropharynx is clear and moist.  Eyes: Conjunctivae are normal. Pupils are equal, round, and reactive to light. Right eye exhibits no discharge. Left eye exhibits no discharge.  Neck: Neck supple.  Cardiovascular: Normal rate, regular rhythm, normal heart sounds and intact distal pulses. Exam reveals no gallop and no friction rub.  No murmur heard. Pulmonary/Chest: Effort normal. No respiratory distress. He has no wheezes. He has no rales.  Diminished to bilateral bases.  No increased work of breathing.  Symmetric chest expansion bilaterally.  Abdominal: Soft. There is no tenderness.  Musculoskeletal: He exhibits no edema or tenderness.  Lymphadenopathy:    He has no cervical adenopathy.  Neurological: He is alert. Coordination normal.  Skin: Skin is  warm and dry. No rash noted. He is not diaphoretic. No erythema. No pallor.  Psychiatric: He has a normal mood and affect. His behavior is normal.  Nursing note and vitals reviewed.    ED Treatments / Results  Labs (all labs ordered are listed, but only abnormal results are displayed) Labs Reviewed  BASIC METABOLIC PANEL - Abnormal; Notable for the following components:  Result Value   Chloride 94 (*)    Glucose, Bld 112 (*)    BUN 23 (*)    Creatinine, Ser 3.45 (*)    Calcium 8.4 (*)    GFR calc non Af Amer 15 (*)    GFR calc Af Amer 18 (*)    All other components within normal limits  CBC WITH DIFFERENTIAL/PLATELET - Abnormal; Notable for the following components:   WBC 16.6 (*)    RBC 3.46 (*)    Hemoglobin 11.5 (*)    HCT 35.9 (*)    MCV 103.8 (*)    RDW 16.8 (*)    All other components within normal limits    EKG  EKG Interpretation None       Radiology Dg Chest 2 View  Result Date: 06/20/2017 CLINICAL DATA:  Productive cough for 1 week EXAM: CHEST  2 VIEW COMPARISON:  May 20, 2017 FINDINGS: The heart size and mediastinal contours are within normal limits. There is no focal infiltrate, pulmonary edema, or pleural effusion. The lungs are hyperinflated. Mild atelectasis of right lung base is noted. The visualized skeletal structures are unremarkable. IMPRESSION: No active cardiopulmonary disease. Emphysema. Mild atelectasis of right lung base. Electronically Signed   By: Abelardo Diesel M.D.   On: 06/20/2017 18:20    Procedures Procedures (including critical care time)  Medications Ordered in ED Medications  ipratropium-albuterol (DUONEB) 0.5-2.5 (3) MG/3ML nebulizer solution 3 mL (3 mLs Nebulization Given 06/20/17 2122)  cefTRIAXone (ROCEPHIN) 1 g in dextrose 5 % 50 mL IVPB (0 g Intravenous Stopped 06/20/17 2220)  azithromycin (ZITHROMAX) 500 mg in dextrose 5 % 250 mL IVPB (0 mg Intravenous Stopped 06/20/17 2320)     Initial Impression / Assessment  and Plan / ED Course  I have reviewed the triage vital signs and the nursing notes.  Pertinent labs & imaging results that were available during my care of the patient were reviewed by me and considered in my medical decision making (see chart for details).     This Is a 82 y.o. Male with a history of COPD and end-stage renal disease on dialysis who presents to the emerge cough ongoing for the past week.  Reports been productive and gradually worsening over the past week.  He reports feeling slightly more short of breath with exertion today than normal.  He always has some shortness of breath.  He is oxygen at home at night.  No oxygen during the day.  He last received dialysis today.  He is on dialysis scheduled Monday, Wednesday and Friday.  He reports associated sneezing, runny nose and postnasal drip.  On exam the patient is afebrile nontoxic-appearing.  On arrival to his room nursing staff noted that his oxygen saturation is between 85 and 88% on room air with good waveform.  He was placed on nasal cannula oxygen with brought this back up to 95% on nasal cannula.  On lung exam he is diminished his bilateral bases.  Chest x-ray shows no active cardio pulmonary disease.  Emphysema and mild atelectasis of right lung base.  I question pneumonia.  BMP is consistent with the patient in end-stage renal disease on dialysis.  CBC is remarkable for leukocytosis with a white count of 16,600.  Left shift.   With patient's hypoxia and suspected pneumonia will plan for admission.  He has been provided with a azithromycin and Rocephin in the emergency department.  Patient and family agree with plan for admission.  I consulted with Dr.  Blaine Hamper who accepted the patient for admission.  This patient was discussed with and evaluated by Dr. Ashok Cordia who agrees with assessment and plan.  Final Clinical Impressions(s) / ED Diagnoses   Final diagnoses:  Community acquired pneumonia, unspecified laterality  ESRD on  dialysis [facility])  Hypoxia    ED Discharge Orders    None       Waynetta Pean, PA-C 06/21/17 Hoyle Sauer, MD 06/23/17 [phone]

## 2017-06-20 NOTE — ED Triage Notes (Signed)
Pt reports URI with productive cough x 1 week. Pt reports he has been coughing up thick, white sputum. Pt denies difficult breathing at this time. PT reports the dr. At dialysis suggested he come in to rule out pneumonia.

## 2017-06-21 DIAGNOSIS — B342 Coronavirus infection, unspecified: Secondary | ICD-10-CM | POA: Diagnosis not present

## 2017-06-21 DIAGNOSIS — I10 Essential (primary) hypertension: Secondary | ICD-10-CM | POA: Diagnosis not present

## 2017-06-21 DIAGNOSIS — R05 Cough: Secondary | ICD-10-CM | POA: Diagnosis present

## 2017-06-21 DIAGNOSIS — E7800 Pure hypercholesterolemia, unspecified: Secondary | ICD-10-CM | POA: Diagnosis present

## 2017-06-21 DIAGNOSIS — J440 Chronic obstructive pulmonary disease with acute lower respiratory infection: Secondary | ICD-10-CM | POA: Diagnosis present

## 2017-06-21 DIAGNOSIS — Z9841 Cataract extraction status, right eye: Secondary | ICD-10-CM | POA: Diagnosis not present

## 2017-06-21 DIAGNOSIS — N186 End stage renal disease: Secondary | ICD-10-CM | POA: Diagnosis not present

## 2017-06-21 DIAGNOSIS — Z9981 Dependence on supplemental oxygen: Secondary | ICD-10-CM | POA: Diagnosis not present

## 2017-06-21 DIAGNOSIS — J441 Chronic obstructive pulmonary disease with (acute) exacerbation: Secondary | ICD-10-CM | POA: Diagnosis present

## 2017-06-21 DIAGNOSIS — I482 Chronic atrial fibrillation: Secondary | ICD-10-CM | POA: Diagnosis present

## 2017-06-21 DIAGNOSIS — D638 Anemia in other chronic diseases classified elsewhere: Secondary | ICD-10-CM

## 2017-06-21 DIAGNOSIS — Z961 Presence of intraocular lens: Secondary | ICD-10-CM | POA: Diagnosis present

## 2017-06-21 DIAGNOSIS — Z992 Dependence on renal dialysis: Secondary | ICD-10-CM

## 2017-06-21 DIAGNOSIS — R0902 Hypoxemia: Secondary | ICD-10-CM

## 2017-06-21 DIAGNOSIS — Z8249 Family history of ischemic heart disease and other diseases of the circulatory system: Secondary | ICD-10-CM | POA: Diagnosis not present

## 2017-06-21 DIAGNOSIS — M170 Bilateral primary osteoarthritis of knee: Secondary | ICD-10-CM | POA: Diagnosis present

## 2017-06-21 DIAGNOSIS — J9621 Acute and chronic respiratory failure with hypoxia: Secondary | ICD-10-CM | POA: Diagnosis present

## 2017-06-21 DIAGNOSIS — N2581 Secondary hyperparathyroidism of renal origin: Secondary | ICD-10-CM | POA: Diagnosis present

## 2017-06-21 DIAGNOSIS — Z7982 Long term (current) use of aspirin: Secondary | ICD-10-CM | POA: Diagnosis not present

## 2017-06-21 DIAGNOSIS — Z8673 Personal history of transient ischemic attack (TIA), and cerebral infarction without residual deficits: Secondary | ICD-10-CM | POA: Diagnosis not present

## 2017-06-21 DIAGNOSIS — I2510 Atherosclerotic heart disease of native coronary artery without angina pectoris: Secondary | ICD-10-CM | POA: Diagnosis present

## 2017-06-21 DIAGNOSIS — A419 Sepsis, unspecified organism: Secondary | ICD-10-CM | POA: Diagnosis present

## 2017-06-21 DIAGNOSIS — Z9842 Cataract extraction status, left eye: Secondary | ICD-10-CM | POA: Diagnosis not present

## 2017-06-21 DIAGNOSIS — M19042 Primary osteoarthritis, left hand: Secondary | ICD-10-CM | POA: Diagnosis present

## 2017-06-21 DIAGNOSIS — Z955 Presence of coronary angioplasty implant and graft: Secondary | ICD-10-CM | POA: Diagnosis not present

## 2017-06-21 DIAGNOSIS — I132 Hypertensive heart and chronic kidney disease with heart failure and with stage 5 chronic kidney disease, or end stage renal disease: Secondary | ICD-10-CM | POA: Diagnosis present

## 2017-06-21 DIAGNOSIS — I5032 Chronic diastolic (congestive) heart failure: Secondary | ICD-10-CM | POA: Diagnosis present

## 2017-06-21 DIAGNOSIS — E1122 Type 2 diabetes mellitus with diabetic chronic kidney disease: Secondary | ICD-10-CM | POA: Diagnosis not present

## 2017-06-21 DIAGNOSIS — M19041 Primary osteoarthritis, right hand: Secondary | ICD-10-CM | POA: Diagnosis present

## 2017-06-21 LAB — LACTIC ACID, PLASMA
LACTIC ACID, VENOUS: 0.9 mmol/L (ref 0.5–1.9)
Lactic Acid, Venous: 1.2 mmol/L (ref 0.5–1.9)

## 2017-06-21 LAB — INFLUENZA PANEL BY PCR (TYPE A & B)
Influenza A By PCR: NEGATIVE
Influenza B By PCR: NEGATIVE

## 2017-06-21 LAB — BASIC METABOLIC PANEL
Anion gap: 13 (ref 5–15)
BUN: 38 mg/dL — ABNORMAL HIGH (ref 6–20)
CHLORIDE: 93 mmol/L — AB (ref 101–111)
CO2: 31 mmol/L (ref 22–32)
CREATININE: 4.84 mg/dL — AB (ref 0.61–1.24)
Calcium: 8.4 mg/dL — ABNORMAL LOW (ref 8.9–10.3)
GFR calc non Af Amer: 10 mL/min — ABNORMAL LOW (ref >60)
GFR, EST AFRICAN AMERICAN: 12 mL/min — AB (ref >60)
Glucose, Bld: 167 mg/dL — ABNORMAL HIGH (ref 65–99)
Potassium: 4.9 mmol/L (ref 3.5–5.1)
Sodium: 137 mmol/L (ref 135–145)

## 2017-06-21 LAB — RESPIRATORY PANEL BY PCR
ADENOVIRUS-RVPPCR: NOT DETECTED
Bordetella pertussis: NOT DETECTED
CHLAMYDOPHILA PNEUMONIAE-RVPPCR: NOT DETECTED
CORONAVIRUS NL63-RVPPCR: DETECTED — AB
CORONAVIRUS OC43-RVPPCR: NOT DETECTED
Coronavirus 229E: NOT DETECTED
Coronavirus HKU1: NOT DETECTED
INFLUENZA A-RVPPCR: NOT DETECTED
INFLUENZA B-RVPPCR: NOT DETECTED
Metapneumovirus: NOT DETECTED
Mycoplasma pneumoniae: NOT DETECTED
PARAINFLUENZA VIRUS 3-RVPPCR: NOT DETECTED
PARAINFLUENZA VIRUS 4-RVPPCR: NOT DETECTED
Parainfluenza Virus 1: NOT DETECTED
Parainfluenza Virus 2: NOT DETECTED
RESPIRATORY SYNCYTIAL VIRUS-RVPPCR: NOT DETECTED
RHINOVIRUS / ENTEROVIRUS - RVPPCR: NOT DETECTED

## 2017-06-21 LAB — CBC
HCT: 35.5 % — ABNORMAL LOW (ref 39.0–52.0)
HEMOGLOBIN: 11.3 g/dL — AB (ref 13.0–17.0)
MCH: 33.1 pg (ref 26.0–34.0)
MCHC: 31.8 g/dL (ref 30.0–36.0)
MCV: 104.1 fL — AB (ref 78.0–100.0)
PLATELETS: 241 10*3/uL (ref 150–400)
RBC: 3.41 MIL/uL — AB (ref 4.22–5.81)
RDW: 16.9 % — ABNORMAL HIGH (ref 11.5–15.5)
WBC: 13.1 10*3/uL — ABNORMAL HIGH (ref 4.0–10.5)

## 2017-06-21 LAB — HIV ANTIBODY (ROUTINE TESTING W REFLEX): HIV Screen 4th Generation wRfx: NONREACTIVE

## 2017-06-21 LAB — PROCALCITONIN: Procalcitonin: 1.13 ng/mL

## 2017-06-21 MED ORDER — ETHYL CHLORIDE EX AERO: 1.0000 "application " | INHALATION_SPRAY | CUTANEOUS | Status: DC

## 2017-06-21 MED ORDER — METHYLPREDNISOLONE SODIUM SUCC 40 MG IJ SOLR
30.0000 mg | Freq: Two times a day (BID) | INTRAMUSCULAR | Status: DC
  Administered 2017-06-21 – 2017-06-22 (×2): 30 mg via INTRAVENOUS
  Filled 2017-06-21 (×2): qty 1

## 2017-06-21 MED ORDER — ALBUTEROL SULFATE (2.5 MG/3ML) 0.083% IN NEBU: 2.5000 mg | INHALATION_SOLUTION | RESPIRATORY_TRACT | Status: DC | PRN

## 2017-06-21 MED ORDER — METOPROLOL SUCCINATE ER 50 MG PO TB24
50.0000 mg | ORAL_TABLET | Freq: Two times a day (BID) | ORAL | Status: DC
  Administered 2017-06-21 – 2017-06-22 (×3): 50 mg via ORAL
  Filled 2017-06-21 (×6): qty 1

## 2017-06-21 MED ORDER — DM-GUAIFENESIN ER 30-600 MG PO TB12
1.0000 | ORAL_TABLET | Freq: Two times a day (BID) | ORAL | Status: DC | PRN
  Administered 2017-06-21: 1 via ORAL
  Filled 2017-06-21: qty 1

## 2017-06-21 MED ORDER — MOMETASONE FURO-FORMOTEROL FUM 200-5 MCG/ACT IN AERO
2.0000 | INHALATION_SPRAY | Freq: Two times a day (BID) | RESPIRATORY_TRACT | Status: DC
  Administered 2017-06-21: 2 via RESPIRATORY_TRACT
  Filled 2017-06-21 (×2): qty 8.8

## 2017-06-21 MED ORDER — IPRATROPIUM-ALBUTEROL 0.5-2.5 (3) MG/3ML IN SOLN
3.0000 mL | RESPIRATORY_TRACT | Status: DC
  Administered 2017-06-21 (×3): 3 mL via RESPIRATORY_TRACT
  Filled 2017-06-21 (×5): qty 3

## 2017-06-21 MED ORDER — DEXTROSE 5 % IV SOLN: 1.0000 g | INTRAVENOUS | Status: DC

## 2017-06-21 MED ORDER — VITAMIN D 1000 UNITS PO TABS
1000.0000 [IU] | ORAL_TABLET | Freq: Every day | ORAL | Status: DC
  Administered 2017-06-21 – 2017-06-22 (×2): 1000 [IU] via ORAL
  Filled 2017-06-21 (×2): qty 1

## 2017-06-21 MED ORDER — ASPIRIN EC 325 MG PO TBEC
325.0000 mg | DELAYED_RELEASE_TABLET | Freq: Every day | ORAL | Status: DC
  Administered 2017-06-21 – 2017-06-22 (×2): 325 mg via ORAL
  Filled 2017-06-21 (×2): qty 1

## 2017-06-21 MED ORDER — METHYLPREDNISOLONE SODIUM SUCC 125 MG IJ SOLR
60.0000 mg | Freq: Two times a day (BID) | INTRAMUSCULAR | Status: DC
  Administered 2017-06-21 (×2): 60 mg via INTRAVENOUS
  Filled 2017-06-21 (×2): qty 2

## 2017-06-21 MED ORDER — RENA-VITE PO TABS
1.0000 | ORAL_TABLET | Freq: Every day | ORAL | Status: DC
  Administered 2017-06-21 – 2017-06-22 (×2): 1 via ORAL
  Filled 2017-06-21 (×3): qty 1

## 2017-06-21 MED ORDER — TIOTROPIUM BROMIDE MONOHYDRATE 18 MCG IN CAPS
18.0000 ug | ORAL_CAPSULE | Freq: Every day | RESPIRATORY_TRACT | Status: DC
  Administered 2017-06-21: 18 ug via RESPIRATORY_TRACT
  Filled 2017-06-21: qty 5

## 2017-06-21 MED ORDER — METHOCARBAMOL 500 MG PO TABS
500.0000 mg | ORAL_TABLET | Freq: Two times a day (BID) | ORAL | Status: DC | PRN
  Administered 2017-06-21: 500 mg via ORAL
  Filled 2017-06-21: qty 1

## 2017-06-21 MED ORDER — IPRATROPIUM-ALBUTEROL 0.5-2.5 (3) MG/3ML IN SOLN
3.0000 mL | Freq: Three times a day (TID) | RESPIRATORY_TRACT | Status: DC
  Administered 2017-06-21 – 2017-06-22 (×2): 3 mL via RESPIRATORY_TRACT
  Filled 2017-06-21 (×3): qty 3

## 2017-06-21 MED ORDER — ATORVASTATIN CALCIUM 20 MG PO TABS
20.0000 mg | ORAL_TABLET | Freq: Every day | ORAL | Status: DC
  Administered 2017-06-21 – 2017-06-22 (×2): 20 mg via ORAL
  Filled 2017-06-21 (×2): qty 1

## 2017-06-21 MED ORDER — METHYLPREDNISOLONE SODIUM SUCC 40 MG IJ SOLR: 40.0000 mg | Freq: Two times a day (BID) | INTRAMUSCULAR | Status: DC

## 2017-06-21 MED ORDER — HEPARIN SODIUM (PORCINE) 5000 UNIT/ML IJ SOLN
5000.0000 [IU] | Freq: Three times a day (TID) | INTRAMUSCULAR | Status: DC
  Administered 2017-06-21 (×2): 5000 [IU] via SUBCUTANEOUS
  Filled 2017-06-21 (×5): qty 1

## 2017-06-21 MED ORDER — CALCIUM ACETATE (PHOS BINDER) 667 MG PO CAPS
667.0000 mg | ORAL_CAPSULE | Freq: Three times a day (TID) | ORAL | Status: DC
  Administered 2017-06-21 – 2017-06-22 (×4): 667 mg via ORAL
  Filled 2017-06-21 (×4): qty 1
  Filled 2017-06-21: qty 2

## 2017-06-21 MED ORDER — DEXTROSE 5 % IV SOLN
500.0000 mg | INTRAVENOUS | Status: DC
  Administered 2017-06-21: 500 mg via INTRAVENOUS
  Filled 2017-06-21 (×2): qty 500

## 2017-06-21 NOTE — H&P (Signed)
History and Physical    Terry [name]. EGB:[id number] DOB: 09-04-1934 DOA: 06/20/2017  Referring MD/NP/PA:   PCP: Terry Kern, DO   Patient coming from:  The patient is coming from home.  At baseline, pt is independent for most of ADL. SN  Chief Complaint: cough and SOB  HPI: Terry [name]. is a 82 y.o. male with medical history significant of ESRD-HD (MWF), hypertension, hyperlipidemia, COPD, stroke, anxiety, CHF, A. fib not on anticoagulants, CAD, s/p of stent placement, who presents with cough, shortness distress.  Patient states that he has been coughing for more than one week. He coughs up white thick sputum. He has mild SOB, but denies chest pain, fever or chills. He has runny nose, but no sore throat. No tenderness in the calf areas. Patient does not have nausea, vomiting, diarrhea, abdominal pain, symptoms of UTI or unilateral weakness. Patient states that his dialysis schedule is normally on MWF. They made some changes for his dialysis scheduled due to holiday, he just had a full course of dialysis today. Pt speaks in full sentence.  ED Course: pt was found to have WBC 16.6, potassium 3.5, bicarbonate 32, creatinine 3.45, BUN 23, temperature normal, mild tachycardia, has tachypnea, O2 sat are 92% on 2 L nasal cannula oxygen, chest x-rays negative for infiltration. Patient is admitted to telemetry bed as inpatient.  Review of Systems:   General: no fevers, chills,has fatigue HEENT: no blurry vision, hearing changes or sore throat Respiratory: has dyspnea, coughing, no wheezing CV: no chest pain, no palpitations GI: no nausea, vomiting, abdominal pain, diarrhea, constipation GU: no dysuria, burning on urination, increased urinary frequency, hematuria  Ext: no leg edema Neuro: no unilateral weakness, numbness, or tingling, no vision change or hearing loss Skin: no rash, no skin tear. MSK: No muscle spasm, no deformity, no limitation of range of movement in spin Heme: No  easy bruising.  Travel history: No recent long distant travel.  Allergy:  Allergies  Allergen Reactions  . Yellow Dyes (Non-Tartrazine) Other (See Comments)    Burns arms Cough medications (tussionex and benzonatate ok)  . Levaquin [Levofloxacin In D5w] Itching  . Tussionex Pennkinetic Er [Hydrocod Polst-Cpm Polst Er] Other (See Comments)    Unknown on MAR    Past Medical History:  Diagnosis Date  . Acute and chronic respiratory failure 10/22/2012  . Anxiety   . Arthritis    "hands, knees" (03/15/2017)  . Atrial fibrillation ([name])   . CAD (coronary artery disease) [name], [name]  . CHF (congestive heart failure) ([place]) 09/2012  . COPD (chronic obstructive pulmonary disease) ([name])   . ESRD (end stage renal disease) on dialysis [name])    "MWF; [facility], [place]" (03/15/2017)  . Flu 09/24/2016   influenza b  . HCAP (healthcare-associated pneumonia) 03/15/2017  . High cholesterol   . History of blood transfusion 2012   "related to bowel resection"  . Hypertension   . On home oxygen therapy    "1L typically at night" (03/15/2017)  . Osteoarthritis 10/22/2012  . Pneumonia April 2014; 09/2016  . Shortness of breath dyspnea   . Stroke [name]) 2012   denies residual on 03/15/2017  . Stroke [name]) ~ 2013   "small one in his right eye"    Past Surgical History:  Procedure Laterality Date  . APPENDECTOMY  1984  . AV FISTULA PLACEMENT Right 05/21/2015   Procedure: Right Arm Brachiocephalic ARTERIOVENOUS (AV) FISTULA CREATION;  Surgeon: Angelia Mould, MD;  Location: [facility] OR;  Service: Vascular;  Laterality: Right;  . CATARACT EXTRACTION W/ INTRAOCULAR LENS  IMPLANT, BILATERAL Bilateral   . COLECTOMY  2012   partial colon removed - twisted bowel  . CORONARY ANGIOPLASTY WITH STENT PLACEMENT  1999   2 stents  . CYSTOSCOPY/RETROGRADE/URETEROSCOPY Bilateral 04/23/2014   Procedure: CYSTOSCOPY BILATERAL RETROGRADE,  LEFT URETEROSCOPY, LEFT STENT PLACEMENT;  Surgeon: Raynelle Bring, MD;  Location: [facility] ORS;  Service: Urology;  Laterality: Bilateral;  . EYE SURGERY Left    "surgery to clean off lens after cataract OR"  . INGUINAL HERNIA REPAIR Right     Social History:  reports that he quit smoking about 17 years ago. His smoking use included cigarettes, pipe, and cigars. He has a 49.00 pack-year smoking history. He has quit using smokeless tobacco. His smokeless tobacco use included chew. He reports that he drinks alcohol. He reports that he does not use drugs.  Family History:  Family History  Problem Relation Age of Onset  . Cancer Mother        Breast  . Heart disease Father   . Heart attack Father   . Parkinson's disease Brother      Prior to Admission medications   Medication Sig Start Date End Date Taking? Authorizing Provider  Aclidinium Bromide (TUDORZA PRESSAIR) 400 MCG/ACT AEPB Inhale 2 puffs into the lungs daily. Patient taking differently: Inhale 1 puff into the lungs 2 (two) times daily.  06/16/17  Yes Tanda Rockers, MD  albuterol (PROVENTIL) (2.5 MG/3ML) 0.083% nebulizer solution Take 3 mLs (2.5 mg total) by nebulization every 6 (six) hours as needed for wheezing or shortness of breath. 03/18/17  Yes Elwin Mocha, MD  aspirin EC 325 MG tablet Take 1 tablet (325 mg total) by mouth daily. 09/26/15  Yes Burnell Blanks, MD  atorvastatin (LIPITOR) 20 MG tablet Take 1 tablet (20 mg total) by mouth daily. 09/21/16  Yes Burnell Blanks, MD  azithromycin (ZITHROMAX) 250 MG tablet Take 2 on day one then 1 daily x 4 days Patient taking differently: Take 250-500 mg by mouth See admin instructions. Take 2 on day one then 1 daily x 4 days 05/20/17  Yes Tanda Rockers, MD  calcium acetate (PHOSLO) 667 MG capsule Take 667-1,334 mg by mouth 3 (three) times daily with meals.  01/09/16  Yes [provider]  cholecalciferol (VITAMIN D) 1000 units tablet Take 1,000 Units by mouth daily.   Yes [provider]  ethyl chloride spray  Apply 1 application topically See admin instructions. For dialysis 05/28/17  Yes [provider]  methocarbamol (ROBAXIN) 500 MG tablet Take 500 mg by mouth 2 (two) times daily as needed for muscle spasms.   Yes [provider]  metoprolol succinate (TOPROL-XL) 50 MG 24 hr tablet Take 50 mg by mouth 2 (two) times daily. Take with or immediately following a meal.    Yes [provider]  midodrine (PROAMATINE) 10 MG tablet Take 10 mg by mouth as needed. At dialysis for low pressure   Yes [provider]  multivitamin (RENA-VIT) TABS tablet Take 1 tablet by mouth daily.   Yes [provider]  predniSONE (DELTASONE) 5 MG tablet 20mg  (4tabs) daily until better then then 10mg  x 1 wk, 5mg  x 1 wk then 0.5 until f/u Patient taking differently: Take 20 mg by mouth daily with breakfast.  06/17/17  Yes Tanda Rockers, MD  SYMBICORT 160-4.5 MCG/ACT inhaler INHALE TWO PUFFS BY MOUTH TWICE DAILY  06/14/17  Yes Tanda Rockers, MD  azithromycin (ZITHROMAX) 250 MG tablet Take 2 tablets (500 mg) on  Day 1,  followed by 1 tablet (250 mg) once daily on Days 2 through 5. Patient not taking: Reported on 06/21/2017 06/17/17 06/22/17  Tanda Rockers, MD  OXYGEN Inhale 1 L into the lungs at bedtime.     [provider]  predniSONE (DELTASONE) 10 MG tablet Take 0.5 tablets (5 mg total) by mouth daily with breakfast. Take 1/2 tablet by mouth daily Patient not taking: Reported on 06/21/2017 05/20/17   Tanda Rockers, MD  SYMBICORT 160-4.5 MCG/ACT inhaler INHALE 2 PUFFS TWICE DAILY Patient not taking: Reported on 06/21/2017 11/30/16   Tanda Rockers, MD    Physical Exam: Vitals:   06/20/17 2315 06/20/17 2330 06/20/17 2331 06/21/17 0000  BP: 119/60  (!) 109/50 (!) 145/52  Pulse: 88 90  88  Resp: 19 (!) 22  (!) 21  Temp:      TempSrc:      SpO2: 93% 93%  96%  Weight:      Height:       General: Not in acute distress HEENT:       Eyes: PERRL, EOMI, no scleral  icterus.       ENT: No discharge from the ears and nose, no pharynx injection, no tonsillar enlargement.        Neck: No JVD, no bruit, no mass felt. Heme: No neck lymph node enlargement. Cardiac: S1/S2, RRR, No murmurs, No gallops or rubs. Respiratory: has dereased air movement bilaterally. No rales, wheezing, or rubs. GI: Soft, nondistended, nontender, no rebound pain, no organomegaly, BS present. GU: No hematuria Ext: No pitting leg edema bilaterally. 2+DP/PT pulse bilaterally. Musculoskeletal: No joint deformities, No joint redness or warmth, no limitation of ROM in spin. Skin: No rashes.  Neuro: Alert, oriented X3, cranial nerves II-XII grossly intact, moves all extremities normally. Psych: Patient is not psychotic, no suicidal or hemocidal ideation.  Labs on Admission: I have personally reviewed following labs and imaging studies  CBC: Recent Labs  Lab 06/20/17 2127  WBC 16.6*  HGB 11.5*  HCT 35.9*  MCV 103.8*  PLT 836   Basic Metabolic Panel: Recent Labs  Lab 06/20/17 2127  NA 137  K 3.5  CL 94*  CO2 32  GLUCOSE 112*  BUN 23*  CREATININE 3.45*  CALCIUM 8.4*   GFR: Estimated Creatinine Clearance: 16.5 mL/min (A) (by C-G formula based on SCr of 3.45 mg/dL (H)). Liver Function Tests: No results for input(s): AST, ALT, ALKPHOS, BILITOT, PROT, ALBUMIN in the last 168 hours. No results for input(s): LIPASE, AMYLASE in the last 168 hours. No results for input(s): AMMONIA in the last 168 hours. Coagulation Profile: No results for input(s): INR, PROTIME in the last 168 hours. Cardiac Enzymes: No results for input(s): CKTOTAL, CKMB, CKMBINDEX, TROPONINI in the last 168 hours. BNP (last 3 results) No results for input(s): PROBNP in the last 8760 hours. HbA1C: No results for input(s): HGBA1C in the last 72 hours. CBG: No results for input(s): GLUCAP in the last 168 hours. Lipid Profile: No results for input(s): CHOL, HDL, LDLCALC, TRIG, CHOLHDL, LDLDIRECT in the  last 72 hours. Thyroid Function Tests: No results for input(s): TSH, T4TOTAL, FREET4, T3FREE, THYROIDAB in the last 72 hours. Anemia Panel: No results for input(s): VITAMINB12, FOLATE, FERRITIN, TIBC, IRON, RETICCTPCT in the last 72 hours. Urine analysis:    Component Value Date/Time   COLORURINE AMBER (A) 08/16/2015 6294  APPEARANCEUR HAZY (A) 08/16/2015 0655   LABSPEC 1.023 08/16/2015 0655   PHURINE 5.0 08/16/2015 0655   GLUCOSEU 100 (A) 08/16/2015 0655   HGBUR NEGATIVE 08/16/2015 0655   BILIRUBINUR SMALL (A) 08/16/2015 0655   KETONESUR 15 (A) 08/16/2015 0655   PROTEINUR >300 (A) 08/16/2015 0655   UROBILINOGEN 0.2 04/16/2014 1135   NITRITE NEGATIVE 08/16/2015 0655   LEUKOCYTESUR SMALL (A) 08/16/2015 0655   Sepsis Labs: @LABRCNTIP (procalcitonin:4,lacticidven:4) )No results found for this or any previous visit (from the past 240 hour(s)).   Radiological Exams on Admission: Dg Chest 2 View  Result Date: 06/20/2017 CLINICAL DATA:  Productive cough for 1 week EXAM: CHEST  2 VIEW COMPARISON:  May 20, 2017 FINDINGS: The heart size and mediastinal contours are within normal limits. There is no focal infiltrate, pulmonary edema, or pleural effusion. The lungs are hyperinflated. Mild atelectasis of right lung base is noted. The visualized skeletal structures are unremarkable. IMPRESSION: No active cardiopulmonary disease. Emphysema. Mild atelectasis of right lung base. Electronically Signed   By: Abelardo Diesel M.D.   On: 06/20/2017 18:20     EKG: Not done in ED, will get one.   Assessment/Plan Principal Problem:   COPD with acute exacerbation (HCC) Active Problems:   Essential hypertension   CAD (coronary artery disease)   Chronic atrial fibrillation (HCC)   Anemia of chronic disease   ESRD (end stage renal disease) on dialysis (HCC)   Hypertension   High cholesterol   Acute on chronic respiratory failure with hypoxia (HCC)   Sepsis (HCC)   Acute on chronic respiratory  failure with hypoxia due to COPD with acute exacerbation and sepsis: No infiltration on chest x-ray. Patient meets criteria for sepsis with leukocytosis, tachycardia and tachypnea. His leukocytosis may have partially due to prednisone use recently. Pending lactic acid. Hemodynamically stable. Of note, patient had history of Klebsiella and Enterobacter pneumonia, positive sputum culture for Pseudomonas. If clinically not improving, may need to broaden the antibiotics.  -will admit patient to telemetry bed  -Nebulizers: scheduled Duoneb and prn albuterol - spiriva inhaler -Solu-Medrol 60 mg bid - Rocephin and azithromycin were started in ED, will continue both  -Mucinex for cough  -Incentive spirometry -Urine S. pneumococcal antigen -Follow up blood culture x2, sputum culture, respiratory virus panel, Flu pcr -Nasal cannula oxygen as needed to maintain O2 saturation 92% or greater -will get Procalcitonin and trend lactic acid levels per sepsis protocol. -IVF: will not start IVF unless lactic acid is elevated due to ESRD.  Essential hypertension: -Continue home medications: Metoprolol -IV hydralazine prn  CAD (coronary artery disease): no CP. -continue metoprolol and Lipitor  Atrial Fibrillation: CHA2DS2-VASc Score is 7, needs oral anticoagulation, but patient is not on Coumadin, not know why. Heart rate is well controlled. -continue metoprolol  Anemia of chronic disease and ESRD: Hgb stable. 11.5 -f/u renal Fx by BMP'  ESRD (end stage renal disease) on dialysis (MWF):  potassium 3.5, bicarbonate 32, creatinine 3.45, BUN 23. -Left message to renal box for HD  High cholesterol -lipitor   DVT ppx: SQ Heparin    Code Status: Full code Family Communication: None at bed side.   Disposition Plan:  Anticipate discharge back to previous home environment Consults called:  None Admission status:  Inpatient/tele    Date of Service 06/21/2017    Ivor Costa [facility] Pager  [phone]  If 7PM-7AM, please contact night-coverage [email or web address] Password [email or web address] 06/21/2017, 12:54 AM

## 2017-06-21 NOTE — Consult Note (Signed)
[facility] Renal Consultation Note    Indication for Consultation:  Management of ESRD/hemodialysis, anemia, hypertension/volume, and secondary hyperparathyroidism. PCP:  HPI: Terry [name]. is a 82 y.o. male with ESRD, HTN, CHF, COPD (on nightly O2), Hx CVA, CAD who was admitted with COPD exacerbation.   Pt reports productive cough for 1 week, mucus is thick and white. Progressed to dyspnea with minimal exertion which is what prompted ED eval. No fever or chills. In ED, found to have WBC 16.6 and hypoxia (O2 92%). CXR was negative for edema or pneumonia. Flu test negative. Viral panel drawn, now resulting + for Corona virus. He was started on abx, nebulized treatments, and steroids. At this point, feels a little better, but still very dyspneic with exertion. Denies chills, N/V or diarrhea. No CP at this time.  From renal standpoint, dialyzes on MWF schedule at [facility]. Had a full HD on Sunday (12/30) per holiday schedule. Stayed entire 4hrs and met his EDW. Next schedule HD is planned for Wed 06/23/16. No recent issues with his AVF.  Past Medical History:  Diagnosis Date  . Acute and chronic respiratory failure 10/22/2012  . Anxiety   . Arthritis    "hands, knees" (03/15/2017)  . Atrial fibrillation ([place])   . CAD (coronary artery disease) [place], [name]  . CHF (congestive heart failure) ([place]) 09/2012  . COPD (chronic obstructive pulmonary disease) ([place])   . ESRD (end stage renal disease) on dialysis [facility])    "MWF; [facility] [place]" (03/15/2017)  . Flu 09/24/2016   influenza b  . HCAP (healthcare-associated pneumonia) 03/15/2017  . High cholesterol   . History of blood transfusion 2012   "related to bowel resection"  . Hypertension   . On home oxygen therapy    "1L typically at night" (03/15/2017)  . Osteoarthritis 10/22/2012  . Pneumonia April 2014; 09/2016  . Shortness of breath dyspnea   . Stroke [facility]) 2012   denies residual on 03/15/2017   . Stroke [facility]) ~ 2013   "small one in his right eye"   Past Surgical History:  Procedure Laterality Date  . APPENDECTOMY  1984  . AV FISTULA PLACEMENT Right 05/21/2015   Procedure: Right Arm Brachiocephalic ARTERIOVENOUS (AV) FISTULA CREATION;  Surgeon: Angelia Mould, MD;  Location: [facility];  Service: Vascular;  Laterality: Right;  . CATARACT EXTRACTION W/ INTRAOCULAR LENS  IMPLANT, BILATERAL Bilateral   . COLECTOMY  2012   partial colon removed - twisted bowel  . CORONARY ANGIOPLASTY WITH STENT PLACEMENT  1999   2 stents  . CYSTOSCOPY/RETROGRADE/URETEROSCOPY Bilateral 04/23/2014   Procedure: CYSTOSCOPY BILATERAL RETROGRADE,  LEFT URETEROSCOPY, LEFT STENT PLACEMENT;  Surgeon: Raynelle Bring, MD;  Location: [facility];  Service: Urology;  Laterality: Bilateral;  . EYE SURGERY Left    "surgery to clean off lens after cataract OR"  . INGUINAL HERNIA REPAIR Right    Family History  Problem Relation Age of Onset  . Cancer Mother        Breast  . Heart disease Father   . Heart attack Father   . Parkinson's disease Brother    Social History:  reports that he quit smoking about 17 years ago. His smoking use included cigarettes, pipe, and cigars. He has a 49.00 pack-year smoking history. He has quit using smokeless tobacco. His smokeless tobacco use included chew. He reports that he drinks alcohol. He reports that he does not use drugs.  ROS: As per HPI otherwise  negative.  Physical Exam: Vitals:   06/21/17 0315 06/21/17 0330 06/21/17 0430 06/21/17 0612  BP: (!) 129/54 119/84 112/62 (!) 126/58  Pulse: (!) 107 94 87 (!) 104  Resp: (!) 24 (!) 23 (!) 21 (!) 26  Temp:    99.4 F (37.4 C)  TempSrc:    Oral  SpO2: 97% 96% 98% 94%  Weight:      Height:         General: Well developed, well nourished, in no acute distress. On nasal oxygen. Head: Normocephalic, atraumatic, sclera non-icteric, mucus membranes are moist. Neck: Supple without lymphadenopathy/masses. JVD not  elevated. Lungs: Coarse expiratory wheezing throughout, no rales. Heart: RRR with normal S1, S2. No murmurs, rubs, or gallops appreciated. Abdomen: Soft, non-tender, non-distended with normoactive bowel sounds. No rebound/guarding. No obvious abdominal masses. Musculoskeletal:  Strength and tone appear normal for age. Lower extremities: No edema or ischemic changes, no open wounds. Neuro: Alert and oriented X 3. Moves all extremities spontaneously. Psych:  Responds to questions appropriately with a normal affect. Dialysis Access: RUE AVF + thrill  Allergies  Allergen Reactions  . Yellow Dyes (Non-Tartrazine) Other (See Comments)    Burns arms Cough medications (tussionex and benzonatate ok)  . Levaquin [Levofloxacin In D5w] Itching  . Tussionex Pennkinetic Er [Hydrocod Polst-Cpm Polst Er] Other (See Comments)    Unknown on MAR   Prior to Admission medications   Medication Sig Start Date End Date Taking? Authorizing Provider  Aclidinium Bromide (TUDORZA PRESSAIR) 400 MCG/ACT AEPB Inhale 2 puffs into the lungs daily. Patient taking differently: Inhale 1 puff into the lungs 2 (two) times daily.  06/16/17  Yes Tanda Rockers, MD  albuterol (PROVENTIL) (2.5 MG/3ML) 0.083% nebulizer solution Take 3 mLs (2.5 mg total) by nebulization every 6 (six) hours as needed for wheezing or shortness of breath. 03/18/17  Yes Elwin Mocha, MD  aspirin EC 325 MG tablet Take 1 tablet (325 mg total) by mouth daily. 09/26/15  Yes Burnell Blanks, MD  atorvastatin (LIPITOR) 20 MG tablet Take 1 tablet (20 mg total) by mouth daily. 09/21/16  Yes Burnell Blanks, MD  azithromycin (ZITHROMAX) 250 MG tablet Take 2 on day one then 1 daily x 4 days Patient taking differently: Take 250-500 mg by mouth See admin instructions. Take 2 on day one then 1 daily x 4 days 05/20/17  Yes Tanda Rockers, MD  calcium acetate (PHOSLO) 667 MG capsule Take 667-1,334 mg by mouth 3 (three) times daily with meals.   01/09/16  Yes [provider]  cholecalciferol (VITAMIN D) 1000 units tablet Take 1,000 Units by mouth daily.   Yes [provider]  ethyl chloride spray Apply 1 application topically See admin instructions. For dialysis 05/28/17  Yes [provider]  methocarbamol (ROBAXIN) 500 MG tablet Take 500 mg by mouth 2 (two) times daily as needed for muscle spasms.   Yes [provider]  metoprolol succinate (TOPROL-XL) 50 MG 24 hr tablet Take 50 mg by mouth 2 (two) times daily. Take with or immediately following a meal.    Yes [provider]  midodrine (PROAMATINE) 10 MG tablet Take 10 mg by mouth as needed. At dialysis for low pressure   Yes [provider]  multivitamin (RENA-VIT) TABS tablet Take 1 tablet by mouth daily.   Yes [provider]  predniSONE (DELTASONE) 5 MG tablet 51m (4tabs) daily until better then then 150mx 1 wk, 32m58m 1 wk then 0.5 until f/u  Patient taking differently: Take 20 mg by mouth daily with breakfast.  06/17/17  Yes Tanda Rockers, MD  SYMBICORT 160-4.5 MCG/ACT inhaler INHALE TWO PUFFS BY MOUTH TWICE DAILY 06/14/17  Yes Tanda Rockers, MD  azithromycin (ZITHROMAX) 250 MG tablet Take 2 tablets (500 mg) on  Day 1,  followed by 1 tablet (250 mg) once daily on Days 2 through 5. Patient not taking: Reported on 06/21/2017 06/17/17 06/22/17  Tanda Rockers, MD  OXYGEN Inhale 1 L into the lungs at bedtime.     [provider]  predniSONE (DELTASONE) 10 MG tablet Take 0.5 tablets (5 mg total) by mouth daily with breakfast. Take 1/2 tablet by mouth daily Patient not taking: Reported on 06/21/2017 05/20/17   Tanda Rockers, MD  SYMBICORT 160-4.5 MCG/ACT inhaler INHALE 2 PUFFS TWICE DAILY Patient not taking: Reported on 06/21/2017 11/30/16   Tanda Rockers, MD   Current Facility-Administered Medications  Medication Dose Route Frequency Provider Last Rate Last Dose  . albuterol (PROVENTIL) (2.5 MG/3ML) 0.083%  nebulizer solution 2.5 mg  2.5 mg Nebulization Q4H PRN Ivor Costa, MD      . aspirin EC tablet 325 mg  325 mg Oral Daily Ivor Costa, MD   325 mg at 06/21/17 1002  . atorvastatin (LIPITOR) tablet 20 mg  20 mg Oral Daily Ivor Costa, MD   20 mg at 06/21/17 1002  . azithromycin (ZITHROMAX) 500 mg in dextrose 5 % 250 mL IVPB  500 mg Intravenous Q24H Ivor Costa, MD      . calcium acetate (PHOSLO) capsule 716-9,678 mg  938-1,017 mg Oral TID WC Ivor Costa, MD   667 mg at 06/21/17 0901  . cholecalciferol (VITAMIN D) tablet 1,000 Units  1,000 Units Oral Daily Ivor Costa, MD   1,000 Units at 06/21/17 1002  . dextromethorphan-guaiFENesin (MUCINEX DM) 30-600 MG per 12 hr tablet 1 tablet  1 tablet Oral BID PRN Debbe Odea, MD      . heparin injection 5,000 Units  5,000 Units Subcutaneous Q8H Ivor Costa, MD   Stopped at 06/21/17 0224  . ipratropium-albuterol (DUONEB) 0.5-2.5 (3) MG/3ML nebulizer solution 3 mL  3 mL Nebulization Q4H Ivor Costa, MD   3 mL at 06/21/17 0902  . methocarbamol (ROBAXIN) tablet 500 mg  500 mg Oral BID PRN Ivor Costa, MD      . methylPREDNISolone sodium succinate (SOLU-MEDROL) 40 mg/mL injection 30 mg  30 mg Intravenous Q12H Rizwan, Saima, MD      . metoprolol succinate (TOPROL-XL) 24 hr tablet 50 mg  50 mg Oral BID Ivor Costa, MD   50 mg at 06/21/17 1001  . mometasone-formoterol (DULERA) 200-5 MCG/ACT inhaler 2 puff  2 puff Inhalation BID Ivor Costa, MD   2 puff at 06/21/17 [phone]  . multivitamin (RENA-VIT) tablet 1 tablet  1 tablet Oral Daily Ivor Costa, MD   1 tablet at 06/21/17 1002  . tiotropium (SPIRIVA) inhalation capsule 18 mcg  18 mcg Inhalation Daily Ivor Costa, MD   18 mcg at 06/21/17 5852   Current Outpatient Medications  Medication Sig Dispense Refill  . Aclidinium Bromide (TUDORZA PRESSAIR) 400 MCG/ACT AEPB Inhale 2 puffs into the lungs daily. (Patient taking differently: Inhale 1 puff into the lungs 2 (two) times daily. ) 1 each 5  . albuterol (PROVENTIL) (2.5 MG/3ML)  0.083% nebulizer solution Take 3 mLs (2.5 mg total) by nebulization every 6 (six) hours as needed for wheezing or shortness of breath. 75 mL 0  . aspirin  EC 325 MG tablet Take 1 tablet (325 mg total) by mouth daily. 30 tablet 0  . atorvastatin (LIPITOR) 20 MG tablet Take 1 tablet (20 mg total) by mouth daily. 90 tablet 2  . azithromycin (ZITHROMAX) 250 MG tablet Take 2 on day one then 1 daily x 4 days (Patient taking differently: Take 250-500 mg by mouth See admin instructions. Take 2 on day one then 1 daily x 4 days) 6 tablet 0  . calcium acetate (PHOSLO) 667 MG capsule Take 667-1,334 mg by mouth 3 (three) times daily with meals.     . cholecalciferol (VITAMIN D) 1000 units tablet Take 1,000 Units by mouth daily.    [name] ethyl chloride spray Apply 1 application topically See admin instructions. For dialysis    . methocarbamol (ROBAXIN) 500 MG tablet Take 500 mg by mouth 2 (two) times daily as needed for muscle spasms.    . metoprolol succinate (TOPROL-XL) 50 MG 24 hr tablet Take 50 mg by mouth 2 (two) times daily. Take with or immediately following a meal.     . midodrine (PROAMATINE) 10 MG tablet Take 10 mg by mouth as needed. At dialysis for low pressure    . multivitamin (RENA-VIT) TABS tablet Take 1 tablet by mouth daily.    . predniSONE (DELTASONE) 5 MG tablet 48m (4tabs) daily until better then then 1254mx 1 wk, 54m33m 1 wk then 0.5 until f/u (Patient taking differently: Take 20 mg by mouth daily with breakfast. ) 80 tablet 0  . SYMBICORT 160-4.5 MCG/ACT inhaler INHALE TWO PUFFS BY MOUTH TWICE DAILY 1 Inhaler 0  . azithromycin (ZITHROMAX) 250 MG tablet Take 2 tablets (500 mg) on  Day 1,  followed by 1 tablet (250 mg) once daily on Days 2 through 5. (Patient not taking: Reported on 06/21/2017) 6 each 0  . OXYGEN Inhale 1 L into the lungs at bedtime.     . predniSONE (DELTASONE) 10 MG tablet Take 0.5 tablets (5 mg total) by mouth daily with breakfast. Take 1/2 tablet by mouth daily (Patient not  taking: Reported on 06/21/2017) 100 tablet 2  . SYMBICORT 160-4.5 MCG/ACT inhaler INHALE 2 PUFFS TWICE DAILY (Patient not taking: Reported on 06/21/2017) 3 Inhaler 3   Labs: Basic Metabolic Panel: Recent Labs  Lab 06/20/17 2127 06/21/17 0830  NA 137 137  K 3.5 4.9  CL 94* 93*  CO2 32 31  GLUCOSE 112* 167*  BUN 23* 38*  CREATININE 3.45* 4.84*  CALCIUM 8.4* 8.4*   CBC: Recent Labs  Lab 06/20/17 2127 06/21/17 0830  WBC 16.6* 13.1*  HGB 11.5* 11.3*  HCT 35.9* 35.5*  MCV 103.8* 104.1*  PLT 255 241   Studies/Results: Dg Chest 2 View  Result Date: 06/20/2017 CLINICAL DATA:  Productive cough for 1 week EXAM: CHEST  2 VIEW COMPARISON:  May 20, 2017 FINDINGS: The heart size and mediastinal contours are within normal limits. There is no focal infiltrate, pulmonary edema, or pleural effusion. The lungs are hyperinflated. Mild atelectasis of right lung base is noted. The visualized skeletal structures are unremarkable. IMPRESSION: No active cardiopulmonary disease. Emphysema. Mild atelectasis of right lung base. Electronically Signed   By: WeiAbelardo DieselD.   On: 06/20/2017 18:20   Dialysis Orders:  [facility], BFR 400, DFR 800, EDW 68kg, 2K/2Ca bath, UFP #4, AVF, heparin 2500 bolus - Mircera 754m76mV q 2 weeks (just ordered, last given 03/31/17) - Hectoral 3mcg82m q HD  Assessment/Plan: 1.  COPD  exacerbation/+ Corona virus: Per primary. On prednisone/nebs/azithromycin. O2 nightly as baseline. 2.  ESRD: Following outpt holiday schedule for him, next HD Wed 06/23/16 if still inpatient. 3.  Hypertension/volume: BP fine, continue current EDW. 4.  Anemia: Hgb 11.3. No ESA ordered. 5.  Metabolic bone disease: Ca ok, Phos pending. Continue binders. 6.  CAD: On metoprolol. 7.  Atrial fibrillation: Previously on warfarin, stopped 06/2015 after significant GI bleed and not restarted.  Veneta Penton, PA-C 06/21/2017, 11:42 AM  [facility] Pager: [phone]  I have seen and examined this patient and agree with plan and assessment in the above note with renal recommendations/intervention highlighted.  Feels better following pred/nebs/abx.  Continue to follow and plan for HD on Wednesday as an outpatient or here as an inpatient if he remains. Governor Rooks Zailyn Rowser,MD 06/21/2017 4:38 PM

## 2017-06-21 NOTE — Progress Notes (Signed)
PROGRESS NOTE    Terry [name].   YSA:[id number]  DOB: 20-Jul-1934  DOA: 06/20/2017 PCP: [name], DO   Brief Narrative:  Terry Vitali. is a 82 y.o. male with medical history significant of ESRD-HD (MWF), hypertension, hyperlipidemia, COPD stopped smoking many years ago but is on 1 L O2 at night, stroke, anxiety, CHF, A. fib not on anticoagulants, CAD, s/p of stent placement, who presents with cough, shortness distress. He is admitted for a COPD exacerbation from the dialysis unit.   Subjective: He feels that cough and congestion are better today. His has been coughing up clear sputum and has no fever or chills. No chest pain. He has no dyspnea. ROS: no complaints of nausea, vomiting, constipation diarrhea, dyspnea or dysuria. No other complaints.   Assessment & Plan:   Principal Problem:   Acute on chronic hypoxic resp failure COPD with acute exacerbation / leukocytosis/ temp 99 Likely acute bronchitis - complaints of cough and congestion- hypoxic in ER - apparently was recently on Prednisone - d/c Ceftriaxone- he does not have pneumonia- no infiltrates- normal pro calcitonin  - Coronavirus detected on Resp virus panel - leukocytosis is improving - no wheezing currently - wean Solumedrol- cont home inhalers - cont Nebs, Mucinex - ambulate to assess need for O2- apparently uses 1 L at night only  Active Problems:   Essential hypertension - Toprol    CAD (coronary artery disease) - cont ASA    Chronic atrial fibrillation  - he tells me that he refuses to take Warfarin- cont B blocker and ASA    Anemia of chronic disease    ESRD (end stage renal disease) on dialysis  - dialysis per Nephrology     DVT prophylaxis: Heparin Code Status: Full code Family Communication:  Disposition Plan: follow in hospital Consultants:   nephrology Procedures:    Antimicrobials:  Anti-infectives (From admission, onward)   Start     Dose/Rate Route Frequency Ordered  Stop   06/21/17 2200  azithromycin (ZITHROMAX) 500 mg in dextrose 5 % 250 mL IVPB     500 mg 250 mL/hr over 60 Minutes Intravenous Every 24 hours 06/21/17 0047 06/28/17 2159   06/21/17 2000  cefTRIAXone (ROCEPHIN) 1 g in dextrose 5 % 50 mL IVPB     1 g 100 mL/hr over 30 Minutes Intravenous Every 24 hours 06/21/17 0047 06/28/17 1959   06/20/17 2100  cefTRIAXone (ROCEPHIN) 1 g in dextrose 5 % 50 mL IVPB     1 g 100 mL/hr over 30 Minutes Intravenous  Once 06/20/17 2057 06/20/17 2220   06/20/17 2100  azithromycin (ZITHROMAX) 500 mg in dextrose 5 % 250 mL IVPB     500 mg 250 mL/hr over 60 Minutes Intravenous  Once 06/20/17 2057 06/20/17 2320       Objective: Vitals:   06/21/17 0315 06/21/17 0330 06/21/17 0430 06/21/17 0612  BP: (!) 129/54 119/84 112/62 (!) 126/58  Pulse: (!) 107 94 87 (!) 104  Resp: (!) 24 (!) 23 (!) 21 (!) 26  Temp:    99.4 F (37.4 C)  TempSrc:    Oral  SpO2: 97% 96% 98% 94%  Weight:      Height:        Intake/Output Summary (Last 24 hours) at 06/21/2017 1045 Last data filed at 06/20/2017 2320 Gross per 24 hour  Intake 300 ml  Output -  Net 300 ml   Filed Weights   06/20/17 1737  Weight: 69.9 kg (  154 lb)    Examination: General exam: Appears comfortable  HEENT: PERRLA, oral mucosa moist, no sclera icterus or thrush Respiratory system: rhonchi-  Respiratory effort normal. Cardiovascular system: S1 & S2 heard, RRR.  No murmurs  Gastrointestinal system: Abdomen soft, non-tender, nondistended. Normal bowel sound. No organomegaly Central nervous system: Alert and oriented. No focal neurological deficits. Extremities: No cyanosis, clubbing or edema Skin: No rashes or ulcers Psychiatry:  Mood & affect appropriate.     Data Reviewed: I have personally reviewed following labs and imaging studies  CBC: Recent Labs  Lab 06/20/17 2127 06/21/17 0830  WBC 16.6* 13.1*  HGB 11.5* 11.3*  HCT 35.9* 35.5*  MCV 103.8* 104.1*  PLT 255 263   Basic  Metabolic Panel: Recent Labs  Lab 06/20/17 2127 06/21/17 0830  NA 137 137  K 3.5 4.9  CL 94* 93*  CO2 32 31  GLUCOSE 112* 167*  BUN 23* 38*  CREATININE 3.45* 4.84*  CALCIUM 8.4* 8.4*   GFR: Estimated Creatinine Clearance: 11.8 mL/min (A) (by C-G formula based on SCr of 4.84 mg/dL (H)). Liver Function Tests: No results for input(s): AST, ALT, ALKPHOS, BILITOT, PROT, ALBUMIN in the last 168 hours. No results for input(s): LIPASE, AMYLASE in the last 168 hours. No results for input(s): AMMONIA in the last 168 hours. Coagulation Profile: No results for input(s): INR, PROTIME in the last 168 hours. Cardiac Enzymes: No results for input(s): CKTOTAL, CKMB, CKMBINDEX, TROPONINI in the last 168 hours. BNP (last 3 results) No results for input(s): PROBNP in the last 8760 hours. HbA1C: No results for input(s): HGBA1C in the last 72 hours. CBG: No results for input(s): GLUCAP in the last 168 hours. Lipid Profile: No results for input(s): CHOL, HDL, LDLCALC, TRIG, CHOLHDL, LDLDIRECT in the last 72 hours. Thyroid Function Tests: No results for input(s): TSH, T4TOTAL, FREET4, T3FREE, THYROIDAB in the last 72 hours. Anemia Panel: No results for input(s): VITAMINB12, FOLATE, FERRITIN, TIBC, IRON, RETICCTPCT in the last 72 hours. Urine analysis:    Component Value Date/Time   COLORURINE AMBER (A) 08/16/2015 0655   APPEARANCEUR HAZY (A) 08/16/2015 0655   LABSPEC 1.023 08/16/2015 0655   PHURINE 5.0 08/16/2015 0655   GLUCOSEU 100 (A) 08/16/2015 0655   HGBUR NEGATIVE 08/16/2015 0655   BILIRUBINUR SMALL (A) 08/16/2015 0655   KETONESUR 15 (A) 08/16/2015 0655   PROTEINUR >300 (A) 08/16/2015 0655   UROBILINOGEN 0.2 04/16/2014 1135   NITRITE NEGATIVE 08/16/2015 0655   LEUKOCYTESUR SMALL (A) 08/16/2015 0655   Sepsis Labs: @LABRCNTIP (procalcitonin:4,lacticidven:4) ) Recent Results (from the past 240 hour(s))  Respiratory Panel by PCR     Status: Abnormal   Collection Time: 06/21/17  2:39  AM  Result Value Ref Range Status   Adenovirus NOT DETECTED NOT DETECTED Final   Coronavirus 229E NOT DETECTED NOT DETECTED Final   Coronavirus HKU1 NOT DETECTED NOT DETECTED Final   Coronavirus NL63 DETECTED (A) NOT DETECTED Final   Coronavirus OC43 NOT DETECTED NOT DETECTED Final   Metapneumovirus NOT DETECTED NOT DETECTED Final   Rhinovirus / Enterovirus NOT DETECTED NOT DETECTED Final   Influenza A NOT DETECTED NOT DETECTED Final   Influenza B NOT DETECTED NOT DETECTED Final   Parainfluenza Virus 1 NOT DETECTED NOT DETECTED Final   Parainfluenza Virus 2 NOT DETECTED NOT DETECTED Final   Parainfluenza Virus 3 NOT DETECTED NOT DETECTED Final   Parainfluenza Virus 4 NOT DETECTED NOT DETECTED Final   Respiratory Syncytial Virus NOT DETECTED NOT DETECTED Final   Bordetella  pertussis NOT DETECTED NOT DETECTED Final   Chlamydophila pneumoniae NOT DETECTED NOT DETECTED Final   Mycoplasma pneumoniae NOT DETECTED NOT DETECTED Final         Radiology Studies: Dg Chest 2 View  Result Date: 06/20/2017 CLINICAL DATA:  Productive cough for 1 week EXAM: CHEST  2 VIEW COMPARISON:  May 20, 2017 FINDINGS: The heart size and mediastinal contours are within normal limits. There is no focal infiltrate, pulmonary edema, or pleural effusion. The lungs are hyperinflated. Mild atelectasis of right lung base is noted. The visualized skeletal structures are unremarkable. IMPRESSION: No active cardiopulmonary disease. Emphysema. Mild atelectasis of right lung base. Electronically Signed   By: Abelardo Diesel M.D.   On: 06/20/2017 18:20      Scheduled Meds: . aspirin EC  325 mg Oral Daily  . atorvastatin  20 mg Oral Daily  . calcium acetate  667-1,334 mg Oral TID WC  . cholecalciferol  1,000 Units Oral Daily  . heparin  5,000 Units Subcutaneous Q8H  . ipratropium-albuterol  3 mL Nebulization Q4H  . methylPREDNISolone (SOLU-MEDROL) injection  40 mg Intravenous Q12H  . metoprolol succinate  50 mg  Oral BID  . mometasone-formoterol  2 puff Inhalation BID  . multivitamin  1 tablet Oral Daily  . tiotropium  18 mcg Inhalation Daily   Continuous Infusions: . azithromycin    . cefTRIAXone (ROCEPHIN)  IV       LOS: 0 days    Time spent in minutes: 35    Debbe Odea, MD [facility] Pager: [email or web address] Password [email or web address] 06/21/2017, 10:45 AM

## 2017-06-21 NOTE — ED Notes (Signed)
Patient moved to 5C-8 alert oriented , resting in bed watching TV no complaints at present.

## 2017-06-21 NOTE — ED Notes (Signed)
Patient wanted to sit up in the chair, assisted to chair.

## 2017-06-21 NOTE — ED Notes (Signed)
Pt placed in hospital bed for comfort.

## 2017-06-21 NOTE — ED Notes (Signed)
Ambulated pt to the nurses station and he had to turn around, pt O2 drooped down to 87% while walking.

## 2017-06-22 DIAGNOSIS — B342 Coronavirus infection, unspecified: Secondary | ICD-10-CM

## 2017-06-22 DIAGNOSIS — R0902 Hypoxemia: Secondary | ICD-10-CM

## 2017-06-22 DIAGNOSIS — N186 End stage renal disease: Secondary | ICD-10-CM

## 2017-06-22 DIAGNOSIS — Z992 Dependence on renal dialysis: Secondary | ICD-10-CM

## 2017-06-22 LAB — MRSA PCR SCREENING: MRSA by PCR: POSITIVE — AB

## 2017-06-22 LAB — CBC
HEMATOCRIT: 30.5 % — AB (ref 39.0–52.0)
HEMOGLOBIN: 10 g/dL — AB (ref 13.0–17.0)
MCH: 33.3 pg (ref 26.0–34.0)
MCHC: 32.8 g/dL (ref 30.0–36.0)
MCV: 101.7 fL — ABNORMAL HIGH (ref 78.0–100.0)
Platelets: 268 10*3/uL (ref 150–400)
RBC: 3 MIL/uL — ABNORMAL LOW (ref 4.22–5.81)
RDW: 16.9 % — ABNORMAL HIGH (ref 11.5–15.5)
WBC: 16.5 10*3/uL — ABNORMAL HIGH (ref 4.0–10.5)

## 2017-06-22 MED ORDER — AZITHROMYCIN 250 MG PO TABS: 250.0000 mg | ORAL_TABLET | Freq: Every day | ORAL | 0 refills | Status: DC

## 2017-06-22 MED ORDER — CHLORHEXIDINE GLUCONATE CLOTH 2 % EX PADS: 6.0000 | MEDICATED_PAD | Freq: Every day | CUTANEOUS | Status: DC

## 2017-06-22 MED ORDER — PREDNISONE 10 MG PO TABS: 60.0000 mg | ORAL_TABLET | Freq: Every day | ORAL | 0 refills | Status: DC

## 2017-06-22 MED ORDER — HYDROCOD POLST-CPM POLST ER 10-8 MG/5ML PO SUER: 5.0000 mL | Freq: Two times a day (BID) | ORAL | 0 refills | Status: DC | PRN

## 2017-06-22 MED ORDER — MUPIROCIN 2 % EX OINT
1.0000 "application " | TOPICAL_OINTMENT | Freq: Two times a day (BID) | CUTANEOUS | Status: DC
  Administered 2017-06-22: 1 via NASAL
  Filled 2017-06-22: qty 22

## 2017-06-22 NOTE — Progress Notes (Signed)
Patient oxygen saturation dropped to 89% on RA during ambulation, but was 94% on 1L. MD notified. Will continue to monitor.

## 2017-06-22 NOTE — Discharge Summary (Signed)
Physician Discharge Summary  Terry [name]. CVE:[id number] DOB: 01/10/1935 DOA: 06/20/2017  PCP: [name], DO  Admit date: 06/20/2017 Discharge date: 06/22/2017  Admitted From: home  Disposition:  home    Discharge Condition:  stable   CODE STATUS:  Full code   Consultations:  nephrology    Discharge Diagnoses:  Principal Problem:   COPD with acute exacerbation ([place]) Active Problems:   Acute on chronic respiratory failure with hypoxia ([place])   Sepsis ([place])   Coronavirus infection   Essential hypertension   CAD (coronary artery disease)   Chronic atrial fibrillation ([place])   Anemia of chronic disease   ESRD (end stage renal disease) on dialysis ([place])   Hypertension   High cholesterol    Brief Narrative:  Terry [name]. is a 83 y.o.malewith medical history significant ofESRD-HD (MWF),hypertension, hyperlipidemia, COPD stopped smoking many years ago but is on 1 L O2 at night, stroke, anxiety, CHF, A. fib not on anticoagulants, CAD,s/p ofstent placement, who presents with cough, shortness distress. He is admitted for a COPD exacerbation from the dialysis unit.   Subjective: Chest congestion has resolved but cough persists- no wheezing and no dyspnea at rest or with exertion. No fevers.    Assessment & Plan:   Principal Problem:   Acute on chronic hypoxic resp failure COPD with acute exacerbation / leukocytosis/ temp 99 Likely acute bronchitis - complaints of cough and congestion- hypoxic in ER - started on Aziithromycin and Ceftriaxone- d/c Ceftriaxone- he does not have pneumonia- no infiltrates and normal pro calcitonin  - Coronavirus detected on Resp virus panel - leukocytosis is improving now - no wheezing since admission- cough is now non-productive but it is often enough to bother him- asking for a cough surpressant - ambulated to assess need for O2- he will need at least 1 L during the day for now I have explained this to the patient and his wife-  he states he typically wears 1 L at night - will d/c home with Prednisone taper, Zithromycin and cough medication-   Active Problems:   Essential hypertension - Toprol    CAD (coronary artery disease) - cont ASA    Chronic atrial fibrillation  - he tells me that does not want to take Warfarin- cont B blocker and ASA    Anemia of chronic disease    ESRD (end stage renal disease) on dialysis  - dialysis per Nephrology will be tomorrow as outpt       Discharge Exam: Vitals:   06/22/17 0754 06/22/17 0904  BP: 127/60   Pulse: 84   Resp: 20   Temp: 98.7 F (37.1 C)   SpO2: 97% 96%   Vitals:   06/21/17 2109 06/22/17 0557 06/22/17 0754 06/22/17 0904  BP: (!) 143/59 (!) 123/52 127/60   Pulse: 82 82 84   Resp: (!) 23 (!) 21 20   Temp: 98.2 F (36.8 C) 98.8 F (37.1 C) 98.7 F (37.1 C)   TempSrc: Oral Oral Oral   SpO2: 92% 96% 97% 96%  Weight: 69.9 kg (154 lb 1.2 oz)     Height:        General: Pt is alert, awake, not in acute distress Cardiovascular: RRR, S1/S2 +, no rubs, no gallops Respiratory: CTA bilaterally, no wheezing, no rhonchi- has dry cough Abdominal: Soft, NT, ND, bowel sounds + Extremities: no edema, no cyanosis   Discharge Instructions  Discharge Instructions    Diet general   Complete by:  As  directed    Renal diet, heart healthy   Increase activity slowly   Complete by:  As directed      Allergies as of 06/22/2017      Reactions   Yellow Dyes (non-tartrazine) Other (See Comments)   Burns arms Cough medications (tussionex and benzonatate ok)   Levaquin [levofloxacin In D5w] Itching   Tussionex Pennkinetic Er [hydrocod Polst-cpm Polst Er] Other (See Comments)   Unknown on MAR      Medication List    TAKE these medications   Aclidinium Bromide 400 MCG/ACT Aepb Commonly known as:  TUDORZA PRESSAIR Inhale 2 puffs into the lungs daily. What changed:    how much to take  when to take this   albuterol (2.5 MG/3ML) 0.083%  nebulizer solution Commonly known as:  PROVENTIL Take 3 mLs (2.5 mg total) by nebulization every 6 (six) hours as needed for wheezing or shortness of breath.   aspirin EC 325 MG tablet Take 1 tablet (325 mg total) by mouth daily.   atorvastatin 20 MG tablet Commonly known as:  LIPITOR Take 1 tablet (20 mg total) by mouth daily.   azithromycin 250 MG tablet Commonly known as:  ZITHROMAX Take 1 tablet (250 mg total) by mouth daily. What changed:    how much to take  how to take this  when to take this  additional instructions  Another medication with the same name was removed. Continue taking this medication, and follow the directions you see here.   calcium acetate 667 MG capsule Commonly known as:  PHOSLO Take 667-1,334 mg by mouth 3 (three) times daily with meals.   chlorpheniramine-HYDROcodone 10-8 MG/5ML Suer Commonly known as:  TUSSIONEX PENNKINETIC ER Take 5 mLs by mouth every 12 (twelve) hours as needed for cough.   cholecalciferol 1000 units tablet Commonly known as:  VITAMIN D Take 1,000 Units by mouth daily.   ethyl chloride spray Apply 1 application topically See admin instructions. For dialysis   methocarbamol 500 MG tablet Commonly known as:  ROBAXIN Take 500 mg by mouth 2 (two) times daily as needed for muscle spasms.   metoprolol succinate 50 MG 24 hr tablet Commonly known as:  TOPROL-XL Take 50 mg by mouth 2 (two) times daily. Take with or immediately following a meal.   midodrine 10 MG tablet Commonly known as:  PROAMATINE Take 10 mg by mouth as needed. At dialysis for low pressure   multivitamin Tabs tablet Take 1 tablet by mouth daily.   OXYGEN Inhale 1 L into the lungs at bedtime.   predniSONE 10 MG tablet Commonly known as:  DELTASONE Take 6 tablets (60 mg total) by mouth daily with breakfast. Take 6 tabs tomorrow and then decrease by 1 tab daily until finished What changed:    how much to take  additional instructions  Another  medication with the same name was removed. Continue taking this medication, and follow the directions you see here.   SYMBICORT 160-4.5 MCG/ACT inhaler Generic drug:  budesonide-formoterol INHALE 2 PUFFS TWICE DAILY What changed:  Another medication with the same name was removed. Continue taking this medication, and follow the directions you see here.       Allergies  Allergen Reactions  . Yellow Dyes (Non-Tartrazine) Other (See Comments)    Burns arms Cough medications (tussionex and benzonatate ok)  . Levaquin [Levofloxacin In D5w] Itching  . Tussionex Pennkinetic Er [Hydrocod Polst-Cpm Polst Er] Other (See Comments)    Unknown on [facility]  Procedures/Studies:    Dg Chest 2 View  Result Date: 06/20/2017 CLINICAL DATA:  Productive cough for 1 week EXAM: CHEST  2 VIEW COMPARISON:  May 20, 2017 FINDINGS: The heart size and mediastinal contours are within normal limits. There is no focal infiltrate, pulmonary edema, or pleural effusion. The lungs are hyperinflated. Mild atelectasis of right lung base is noted. The visualized skeletal structures are unremarkable. IMPRESSION: No active cardiopulmonary disease. Emphysema. Mild atelectasis of right lung base. Electronically Signed   By: Abelardo Diesel M.D.   On: 06/20/2017 18:20     The results of significant diagnostics from this hospitalization (including imaging, microbiology, ancillary and laboratory) are listed below for reference.     Microbiology: Recent Results (from the past 240 hour(s))  Respiratory Panel by PCR     Status: Abnormal   Collection Time: 06/21/17  2:39 AM  Result Value Ref Range Status   Adenovirus NOT DETECTED NOT DETECTED Final   Coronavirus 229E NOT DETECTED NOT DETECTED Final   Coronavirus HKU1 NOT DETECTED NOT DETECTED Final   Coronavirus NL63 DETECTED (A) NOT DETECTED Final   Coronavirus OC43 NOT DETECTED NOT DETECTED Final   Metapneumovirus NOT DETECTED NOT DETECTED Final   Rhinovirus /  Enterovirus NOT DETECTED NOT DETECTED Final   Influenza A NOT DETECTED NOT DETECTED Final   Influenza B NOT DETECTED NOT DETECTED Final   Parainfluenza Virus 1 NOT DETECTED NOT DETECTED Final   Parainfluenza Virus 2 NOT DETECTED NOT DETECTED Final   Parainfluenza Virus 3 NOT DETECTED NOT DETECTED Final   Parainfluenza Virus 4 NOT DETECTED NOT DETECTED Final   Respiratory Syncytial Virus NOT DETECTED NOT DETECTED Final   Bordetella pertussis NOT DETECTED NOT DETECTED Final   Chlamydophila pneumoniae NOT DETECTED NOT DETECTED Final   Mycoplasma pneumoniae NOT DETECTED NOT DETECTED Final  MRSA PCR Screening     Status: Abnormal   Collection Time: 06/21/17  6:50 PM  Result Value Ref Range Status   MRSA by PCR POSITIVE (A) NEGATIVE Final    Comment:        The GeneXpert MRSA Assay (FDA approved for NASAL specimens only), is one component of a comprehensive MRSA colonization surveillance program. It is not intended to diagnose MRSA infection nor to guide or monitor treatment for MRSA infections. RESULT CALLED TO, READ BACK BY AND VERIFIED WITH: CASTRO,E RN 0006 06/22/17 MITCHELL,L      Labs: BNP (last 3 results) Recent Labs    09/23/16 2216  BNP 1,027.2*   Basic Metabolic Panel: Recent Labs  Lab 06/20/17 2127 06/21/17 0830  NA 137 137  K 3.5 4.9  CL 94* 93*  CO2 32 31  GLUCOSE 112* 167*  BUN 23* 38*  CREATININE 3.45* 4.84*  CALCIUM 8.4* 8.4*   Liver Function Tests: No results for input(s): AST, ALT, ALKPHOS, BILITOT, PROT, ALBUMIN in the last 168 hours. No results for input(s): LIPASE, AMYLASE in the last 168 hours. No results for input(s): AMMONIA in the last 168 hours. CBC: Recent Labs  Lab 06/20/17 2127 06/21/17 0830 06/22/17 0611  WBC 16.6* 13.1* 16.5*  HGB 11.5* 11.3* 10.0*  HCT 35.9* 35.5* 30.5*  MCV 103.8* 104.1* 101.7*  PLT 255 241 268   Cardiac Enzymes: No results for input(s): CKTOTAL, CKMB, CKMBINDEX, TROPONINI in the last 168  hours. BNP: Invalid input(s): POCBNP CBG: No results for input(s): GLUCAP in the last 168 hours. D-Dimer No results for input(s): DDIMER in the last 72 hours. Hgb A1c No results for input(s):  HGBA1C in the last 72 hours. Lipid Profile No results for input(s): CHOL, HDL, LDLCALC, TRIG, CHOLHDL, LDLDIRECT in the last 72 hours. Thyroid function studies No results for input(s): TSH, T4TOTAL, T3FREE, THYROIDAB in the last 72 hours.  Invalid input(s): FREET3 Anemia work up No results for input(s): VITAMINB12, FOLATE, FERRITIN, TIBC, IRON, RETICCTPCT in the last 72 hours. Urinalysis    Component Value Date/Time   COLORURINE AMBER (A) 08/16/2015 0655   APPEARANCEUR HAZY (A) 08/16/2015 0655   LABSPEC 1.023 08/16/2015 0655   PHURINE 5.0 08/16/2015 0655   GLUCOSEU 100 (A) 08/16/2015 0655   HGBUR NEGATIVE 08/16/2015 0655   BILIRUBINUR SMALL (A) 08/16/2015 0655   KETONESUR 15 (A) 08/16/2015 0655   PROTEINUR >300 (A) 08/16/2015 0655   UROBILINOGEN 0.2 04/16/2014 1135   NITRITE NEGATIVE 08/16/2015 0655   LEUKOCYTESUR SMALL (A) 08/16/2015 0655   Sepsis Labs Invalid input(s): PROCALCITONIN,  WBC,  LACTICIDVEN Microbiology Recent Results (from the past 240 hour(s))  Respiratory Panel by PCR     Status: Abnormal   Collection Time: 06/21/17  2:39 AM  Result Value Ref Range Status   Adenovirus NOT DETECTED NOT DETECTED Final   Coronavirus 229E NOT DETECTED NOT DETECTED Final   Coronavirus HKU1 NOT DETECTED NOT DETECTED Final   Coronavirus NL63 DETECTED (A) NOT DETECTED Final   Coronavirus OC43 NOT DETECTED NOT DETECTED Final   Metapneumovirus NOT DETECTED NOT DETECTED Final   Rhinovirus / Enterovirus NOT DETECTED NOT DETECTED Final   Influenza A NOT DETECTED NOT DETECTED Final   Influenza B NOT DETECTED NOT DETECTED Final   Parainfluenza Virus 1 NOT DETECTED NOT DETECTED Final   Parainfluenza Virus 2 NOT DETECTED NOT DETECTED Final   Parainfluenza Virus 3 NOT DETECTED NOT DETECTED Final    Parainfluenza Virus 4 NOT DETECTED NOT DETECTED Final   Respiratory Syncytial Virus NOT DETECTED NOT DETECTED Final   Bordetella pertussis NOT DETECTED NOT DETECTED Final   Chlamydophila pneumoniae NOT DETECTED NOT DETECTED Final   Mycoplasma pneumoniae NOT DETECTED NOT DETECTED Final  MRSA PCR Screening     Status: Abnormal   Collection Time: 06/21/17  6:50 PM  Result Value Ref Range Status   MRSA by PCR POSITIVE (A) NEGATIVE Final    Comment:        The GeneXpert MRSA Assay (FDA approved for NASAL specimens only), is one component of a comprehensive MRSA colonization surveillance program. It is not intended to diagnose MRSA infection nor to guide or monitor treatment for MRSA infections. RESULT CALLED TO, READ BACK BY AND VERIFIED WITH: CASTRO,E RN 0006 06/22/17 MITCHELL,L      Time coordinating discharge: Over 30 minutes  SIGNED:   Debbe Odea, MD  [facility] 06/22/2017, 12:32 PM Pager   If 7PM-7AM, please contact night-coverage [email or web address] Password [email or web address]

## 2017-06-22 NOTE — Progress Notes (Signed)
Patient is refusing HD today. MD notified. Rizwan, MD stated that it was ok to skip HD for today. Will continue to monitor.

## 2017-06-22 NOTE — Progress Notes (Signed)
Terry [name]. to be D/C'd Home per MD order.  Discussed prescriptions and follow up appointments with the patient. Prescriptions given to patient, medication list explained in detail. Pt verbalized understanding.  Allergies as of 06/22/2017      Reactions   Yellow Dyes (non-tartrazine) Other (See Comments)   Burns arms Cough medications (tussionex and benzonatate ok)   Levaquin [levofloxacin In D5w] Itching   Tussionex Pennkinetic Er [hydrocod Polst-cpm Polst Er] Other (See Comments)   Unknown on MAR      Medication List    TAKE these medications   Aclidinium Bromide 400 MCG/ACT Aepb Commonly known as:  TUDORZA PRESSAIR Inhale 2 puffs into the lungs daily. What changed:    how much to take  when to take this   albuterol (2.5 MG/3ML) 0.083% nebulizer solution Commonly known as:  PROVENTIL Take 3 mLs (2.5 mg total) by nebulization every 6 (six) hours as needed for wheezing or shortness of breath.   aspirin EC 325 MG tablet Take 1 tablet (325 mg total) by mouth daily.   atorvastatin 20 MG tablet Commonly known as:  LIPITOR Take 1 tablet (20 mg total) by mouth daily.   azithromycin 250 MG tablet Commonly known as:  ZITHROMAX Take 1 tablet (250 mg total) by mouth daily. What changed:    how much to take  how to take this  when to take this  additional instructions  Another medication with the same name was removed. Continue taking this medication, and follow the directions you see here.   calcium acetate 667 MG capsule Commonly known as:  PHOSLO Take 667-1,334 mg by mouth 3 (three) times daily with meals.   chlorpheniramine-HYDROcodone 10-8 MG/5ML Suer Commonly known as:  TUSSIONEX PENNKINETIC ER Take 5 mLs by mouth every 12 (twelve) hours as needed for cough.   cholecalciferol 1000 units tablet Commonly known as:  VITAMIN D Take 1,000 Units by mouth daily.   ethyl chloride spray Apply 1 application topically See admin instructions. For dialysis    methocarbamol 500 MG tablet Commonly known as:  ROBAXIN Take 500 mg by mouth 2 (two) times daily as needed for muscle spasms.   metoprolol succinate 50 MG 24 hr tablet Commonly known as:  TOPROL-XL Take 50 mg by mouth 2 (two) times daily. Take with or immediately following a meal.   midodrine 10 MG tablet Commonly known as:  PROAMATINE Take 10 mg by mouth as needed. At dialysis for low pressure   multivitamin Tabs tablet Take 1 tablet by mouth daily.   OXYGEN Inhale 1 L into the lungs at bedtime.   predniSONE 10 MG tablet Commonly known as:  DELTASONE Take 6 tablets (60 mg total) by mouth daily with breakfast. Take 6 tabs tomorrow and then decrease by 1 tab daily until finished What changed:    how much to take  additional instructions  Another medication with the same name was removed. Continue taking this medication, and follow the directions you see here.   SYMBICORT 160-4.5 MCG/ACT inhaler Generic drug:  budesonide-formoterol INHALE 2 PUFFS TWICE DAILY What changed:  Another medication with the same name was removed. Continue taking this medication, and follow the directions you see here.       Vitals:   06/22/17 0754 06/22/17 0904  BP: 127/60   Pulse: 84   Resp: 20   Temp: 98.7 F (37.1 C)   SpO2: 97% 96%    Skin clean, dry and intact without evidence of skin break down,  no evidence of skin tears noted. IV catheter discontinued intact. Site without signs and symptoms of complications. Dressing and pressure applied. Pt denies pain at this time. No complaints noted.  An After Visit Summary was printed and given to the patient. Patient escorted via [place], and D/C home via private auto.  Dixie Dials RN, BSN

## 2017-06-22 NOTE — Progress Notes (Signed)
[facility] Progress Note   Subjective: siting up in chair, denies SOB. Says he feels better and may go home this afternoon.   Objective Vitals:   06/21/17 2109 06/22/17 0557 06/22/17 0754 06/22/17 0904  BP: (!) 143/59 (!) 123/52 127/60   Pulse: 82 82 84   Resp: (!) 23 (!) 21 20   Temp: 98.2 F (36.8 C) 98.8 F (37.1 C) 98.7 F (37.1 C)   TempSrc: Oral Oral Oral   SpO2: 92% 96% 97% 96%  Weight: 69.9 kg (154 lb 1.2 oz)     Height:       Physical Exam General:  Heart: Lungs: Abdomen: Extremities: Dialysis Access:   Dialysis Orders:  Additional Objective Labs: Basic Metabolic Panel: Recent Labs  Lab 06/20/17 2127 06/21/17 0830  NA 137 137  K 3.5 4.9  CL 94* 93*  CO2 32 31  GLUCOSE 112* 167*  BUN 23* 38*  CREATININE 3.45* 4.84*  CALCIUM 8.4* 8.4*   Liver Function Tests: No results for input(s): AST, ALT, ALKPHOS, BILITOT, PROT, ALBUMIN in the last 168 hours. No results for input(s): LIPASE, AMYLASE in the last 168 hours. CBC: Recent Labs  Lab 06/20/17 2127 06/21/17 0830  WBC 16.6* 13.1*  HGB 11.5* 11.3*  HCT 35.9* 35.5*  MCV 103.8* 104.1*  PLT 255 241   Blood Culture    Component Value Date/Time   SDES BLOOD LEFT UPPER ARM 03/15/2017 0920   SPECREQUEST  03/15/2017 0920    BOTTLES DRAWN AEROBIC AND ANAEROBIC Blood Culture adequate volume   CULT NO GROWTH 5 DAYS 03/15/2017 0920   REPTSTATUS 03/20/2017 FINAL 03/15/2017 0920    Cardiac Enzymes: No results for input(s): CKTOTAL, CKMB, CKMBINDEX, TROPONINI in the last 168 hours. CBG: No results for input(s): GLUCAP in the last 168 hours. Iron Studies: No results for input(s): IRON, TIBC, TRANSFERRIN, FERRITIN in the last 72 hours. @lablastinr3 @ Studies/Results: Dg Chest 2 View  Result Date: 06/20/2017 CLINICAL DATA:  Productive cough for 1 week EXAM: CHEST  2 VIEW COMPARISON:  May 20, 2017 FINDINGS: The heart size and mediastinal contours are within normal limits. There is no  focal infiltrate, pulmonary edema, or pleural effusion. The lungs are hyperinflated. Mild atelectasis of right lung base is noted. The visualized skeletal structures are unremarkable. IMPRESSION: No active cardiopulmonary disease. Emphysema. Mild atelectasis of right lung base. Electronically Signed   By: Abelardo Diesel M.D.   On: 06/20/2017 18:20   Medications: . azithromycin Stopped (06/22/17 0349)   . aspirin EC  325 mg Oral Daily  . atorvastatin  20 mg Oral Daily  . calcium acetate  667-1,334 mg Oral TID WC  . [START ON 06/23/2017] Chlorhexidine Gluconate Cloth  6 each Topical Q0600  . cholecalciferol  1,000 Units Oral Daily  . heparin  5,000 Units Subcutaneous Q8H  . ipratropium-albuterol  3 mL Nebulization TID  . methylPREDNISolone (SOLU-MEDROL) injection  30 mg Intravenous Q12H  . metoprolol succinate  50 mg Oral BID  . mometasone-formoterol  2 puff Inhalation BID  . multivitamin  1 tablet Oral Daily  . mupirocin ointment  1 application Nasal BID  . tiotropium  18 mcg Inhalation Daily     Dialysis Orders:  MWF at [facility] 4hr, 400/800  68 kg, 2K/2Ca UFP 4 -Heparin 2500 units IV TIW - Mircera 29mcg IV q 2 weeks (just ordered, last given 03/31/17) - Hectoral 78mcg IV q HD  Assessment/Plan: 1.  Acute chronic hypoxic resp failure/COPD exacerbation, + Corona virus: Per primary. On  prednisone/nebs. + coronavirus on respiratory panel. ABX Dc'd.  O2 nightly as baseline. 2.  ESRD: Next HD Wed 06/23/16 via [facility]. K+ 4.9 Use 2.0 K bath. Usual Heparin dose.  3.  Hypertension/volume: BP stable, no evidence of volume overload. UFG to OP EDW 68 kg 2-2.5 liters.  4.  Anemia: Hgb 11.3. No ESA ordered. 5.  Metabolic bone disease: Ca 8.4 Check labs with HD tomorrow.  Continue binders/VDRA. 6.  CAD: per primary 7.  Atrial fibrillation: Previously on warfarin, stopped 06/2015 after significant GI bleed and not restarted. On ASA and metoprolol for rate control.    Rita H. Brown NP-C 06/22/2017, 9:20 AM   [employer] [phone]  I have seen and examined this patient and agree with plan and assessment in the above note with renal recommendations/intervention highlighted.  Doing better and stable for discharge from renal standpoint. [place] John A Maitlyn Penza,MD 06/22/2017 3:29 PM

## 2017-06-23 ENCOUNTER — Telehealth: Payer: Self-pay | Admitting: Family Medicine

## 2017-06-23 DIAGNOSIS — N2581 Secondary hyperparathyroidism of renal origin: Secondary | ICD-10-CM | POA: Diagnosis not present

## 2017-06-23 DIAGNOSIS — N186 End stage renal disease: Secondary | ICD-10-CM | POA: Diagnosis not present

## 2017-06-23 NOTE — Telephone Encounter (Signed)
Pt declined the TCM hospital follow up call at this time or schedule a follow up with Dr. Maudie Mercury. He has appointment with pulmonary scheduled for his follow up.

## 2017-06-24 ENCOUNTER — Ambulatory Visit: Payer: Medicare PPO | Admitting: Podiatry

## 2017-06-24 ENCOUNTER — Encounter: Payer: Self-pay | Admitting: Podiatry

## 2017-06-24 DIAGNOSIS — B351 Tinea unguium: Secondary | ICD-10-CM

## 2017-06-24 DIAGNOSIS — M79676 Pain in unspecified toe(s): Secondary | ICD-10-CM

## 2017-06-24 NOTE — Patient Instructions (Signed)
Place 1/4 cup of epsom salts in a quart of warm tap water.  Submerge your foot or feet in the solution and soak for 20 minutes.  This soak should be done twice a day.  Next, remove your foot or feet from solution, blot dry the affected area. Apply ointment and cover if instructed by your doctor.   IF YOUR SKIN BECOMES IRRITATED WHILE USING THESE INSTRUCTIONS, IT IS OKAY TO SWITCH TO  WHITE VINEGAR AND WATER.  As another alternative soak, you may use antibacterial soap and water.  Monitor for any signs/symptoms of infection. Call the office immediately if any occur or go directly to the emergency room. Call with any questions/concerns.  Ingrown Toenail An ingrown toenail occurs when the corner or sides of your toenail grow into the surrounding skin. The big toe is most commonly affected, but it can happen to any of your toes. If your ingrown toenail is not treated, you will be at risk for infection. What are the causes? This condition may be caused by:  Wearing shoes that are too small or tight.  Injury or trauma, such as stubbing your toe or having your toe stepped on.  Improper cutting or care of your toenails.  Being born with (congenital) nail or foot abnormalities, such as having a nail that is too big for your toe.  What increases the risk? Risk factors for an ingrown toenail include:  Age. Your nails tend to thicken as you get older, so ingrown nails are more common in older people.  Diabetes.  Cutting your toenails incorrectly.  Blood circulation problems.  What are the signs or symptoms? Symptoms may include:  Pain, soreness, or tenderness.  Redness.  Swelling.  Hardening of the skin surrounding the toe.  Your ingrown toenail may be infected if there is fluid, pus, or drainage. How is this diagnosed? An ingrown toenail may be diagnosed by medical history and physical exam. If your toenail is infected, your health care provider may test a sample of the  drainage. How is this treated? Treatment depends on the severity of your ingrown toenail. Some ingrown toenails may be treated at home. More severe or infected ingrown toenails may require surgery to remove all or part of the nail. Infected ingrown toenails may also be treated with antibiotic medicines. Follow these instructions at home:  If you were prescribed an antibiotic medicine, finish all of it even if you start to feel better.  Soak your foot in warm soapy water for 20 minutes, 3 times per day or as directed by your health care provider.  Carefully lift the edge of the nail away from the sore skin by wedging a small piece of cotton under the corner of the nail. This may help with the pain. Be careful not to cause more injury to the area.  Wear shoes that fit well. If your ingrown toenail is causing you pain, try wearing sandals, if possible.  Trim your toenails regularly and carefully. Do not cut them in a curved shape. Cut your toenails straight across. This prevents injury to the skin at the corners of the toenail.  Keep your feet clean and dry.  If you are having trouble walking and are given crutches by your health care provider, use them as directed.  Do not pick at your toenail or try to remove it yourself.  Take medicines only as directed by your health care provider.  Keep all follow-up visits as directed by your health care provider. This  is important. Contact a health care provider if:  Your symptoms do not improve with treatment. Get help right away if:  You have red streaks that start at your foot and go up your leg.  You have a fever.  You have increased redness, swelling, or pain.  You have fluid, blood, or pus coming from your toenail. This information is not intended to replace advice given to you by your health care provider. Make sure you discuss any questions you have with your health care provider. Document Released: 06/05/2000 Document Revised:  11/08/2015 Document Reviewed: 05/02/2014 Elsevier Interactive Patient Education  [employer].

## 2017-06-25 DIAGNOSIS — N186 End stage renal disease: Secondary | ICD-10-CM | POA: Diagnosis not present

## 2017-06-25 DIAGNOSIS — N2581 Secondary hyperparathyroidism of renal origin: Secondary | ICD-10-CM | POA: Diagnosis not present

## 2017-06-26 LAB — CULTURE, BLOOD (ROUTINE X 2)
CULTURE: NO GROWTH
Culture: NO GROWTH
Special Requests: ADEQUATE
Special Requests: ADEQUATE

## 2017-06-28 ENCOUNTER — Telehealth: Payer: Self-pay | Admitting: Internal Medicine

## 2017-06-28 DIAGNOSIS — N2581 Secondary hyperparathyroidism of renal origin: Secondary | ICD-10-CM | POA: Diagnosis not present

## 2017-06-28 DIAGNOSIS — N186 End stage renal disease: Secondary | ICD-10-CM | POA: Diagnosis not present

## 2017-06-28 NOTE — Telephone Encounter (Signed)
Called pt letting him know that he needs to keep his appt with TP and to take 20mg  pred daily until he comes for his visit. Pt expressed understanding. Nothing further needed.

## 2017-06-28 NOTE — Telephone Encounter (Signed)
Called and spoke with pt.  Pt states he was d/c on 06/22/17 for PNA and hypoxia. Pt states since d/c, he is still has prod cough with white mucus & mild wheezing.  Denies any other symptoms.  Pt using Ventolin every hour with improvement with cough.  Pt has pending apt for 1.10.19 with TP.   MW please advise. Thanks.   AVS 05/20/17 Patient Instructions    zpak > call if fever or nasty sputum that doesn't respond to this   If short of breath, more wheezy and you already have  followed plan  ABC  >  D = deltasone 5 mg x 4 until better then taper by 1 pill every 2 days until back to one half daily    See calendar for specific medication instructions and bring it back for each and every office visit for every healthcare provider you see.  Without it,  you may not receive the best quality medical care that we feel you deserve.  You will note that the calendar groups together  your maintenance  medications that are timed at particular times of the day.  Think of this as your checklist for what your doctor has instructed you to do until your next evaluation to see what benefit  there is  to staying on a consistent group of medications intended to keep you well.  The other group at the bottom is entirely up to you to use as you see fit  for specific symptoms that may arise between visits that require you to treat them on an as needed basis.  Think of this as your action plan or "what if" list.   Separating the top medications from the bottom group is fundamental to providing you adequate care going forward.    When you return as planned bring your calendar dated today and all your medications

## 2017-06-28 NOTE — Telephone Encounter (Signed)
He should keep that appt and double dose of daily prednisone (whatever he's been taking) until seen or 20 mg per day if not taking at least 10 mg daily

## 2017-06-30 DIAGNOSIS — N2581 Secondary hyperparathyroidism of renal origin: Secondary | ICD-10-CM | POA: Diagnosis not present

## 2017-06-30 DIAGNOSIS — N186 End stage renal disease: Secondary | ICD-10-CM | POA: Diagnosis not present

## 2017-07-01 ENCOUNTER — Encounter: Payer: Self-pay | Admitting: Adult Health

## 2017-07-01 ENCOUNTER — Encounter: Payer: Self-pay | Admitting: Family Medicine

## 2017-07-01 ENCOUNTER — Ambulatory Visit: Payer: Medicare PPO | Admitting: Adult Health

## 2017-07-01 VITALS — BP 132/62 | HR 57 | Ht 68.0 in | Wt 153.0 lb

## 2017-07-01 DIAGNOSIS — J9611 Chronic respiratory failure with hypoxia: Secondary | ICD-10-CM | POA: Diagnosis not present

## 2017-07-01 DIAGNOSIS — J449 Chronic obstructive pulmonary disease, unspecified: Secondary | ICD-10-CM

## 2017-07-01 NOTE — Progress Notes (Signed)
@Patient  ID: Terry [name]., male    DOB: 07/27/1934, 83 y.o.   MRN: [id number]  Chief Complaint  Patient presents with  . Follow-up    COPD     Referring provider: [name], DO  HPI: 83 yo male former smoker followed for GOLD III COPD , chronic resp failure on Oxygen , chronic steroids on prednisone 5mg  daily.  End-stage renal disease on dialysis  TEST  PFT's 01/26/13 FEV1 1.28 (48%) ratio 48 and no better p B2, DLCO 39 corrects to 48% - PFTs 02/26/2015 FEV1 0.99 (35 % ) ratio 35 p 11 % improvement from saba with DLCO 24 %  07/01/2017 Follow up : COPD /Post hospital   Patient presents for a post hospital follow-up.  Patient was recently admitted for COPD exacerbation.  He was treated with IV antibiotics and steroids.  Discharged on a azithromycin and prednisone taper.  Since discharge patient is starting to feel better he remains weak.  He gets winded with minimal activity. Hs some post nasal drainage.  He denies any hemoptysis chest pain orthopnea PND increased leg swelling or fever.[name] He remains on Symbicort and  Tudorza  Remains on ESRD on HD  M/W/F .  Allergies  Allergen Reactions  . Yellow Dyes (Non-Tartrazine) Other (See Comments)    Burns arms Cough medications (tussionex and benzonatate ok)  . Levaquin [Levofloxacin In D5w] Itching    Immunization History  Administered Date(s) Administered  . Influenza Split 02/21/2012  . Influenza Whole 03/23/2016  . Influenza, High Dose Seasonal PF 03/30/2013, 02/18/2017  . Influenza,inj,Quad PF,6+ Mos 02/26/2015  . Influenza-Unspecified 04/06/2014  . Pneumococcal Conjugate-13 04/07/2016  . Pneumococcal Polysaccharide-23 02/21/2012    Past Medical History:  Diagnosis Date  . Acute and chronic respiratory failure 10/22/2012  . Anxiety   . Arthritis    "hands, knees" (03/15/2017)  . Atrial fibrillation ([place])   . CAD (coronary artery disease) [place], [name]  . CHF (congestive heart failure) ([place])  09/2012  . COPD (chronic obstructive pulmonary disease) ([place])   . ESRD (end stage renal disease) on dialysis [facility])    "MWF; [facility] [place]" (03/15/2017)  . Flu 09/24/2016   influenza b  . HCAP (healthcare-associated pneumonia) 03/15/2017  . High cholesterol   . History of blood transfusion 2012   "related to bowel resection"  . Hypertension   . On home oxygen therapy    "1L typically at night" (03/15/2017)  . Osteoarthritis 10/22/2012  . Pneumonia April 2014; 09/2016  . Shortness of breath dyspnea   . Stroke [facility]) 2012   denies residual on 03/15/2017  . Stroke [facility]) ~ 2013   "small one in his right eye"    Tobacco History: Social History   Tobacco Use  Smoking Status Former Smoker  . Packs/day: 1.00  . Years: 49.00  . Pack years: 49.00  . Types: Cigarettes, Pipe, Cigars  . Last attempt to quit: 02/21/2000  . Years since quitting: 17.3  Smokeless Tobacco Former Systems developer  . Types: Chew  Tobacco Comment   "chewed when he was a kid"   Counseling given: Not Answered Comment: "chewed when he was a kid"   Outpatient Encounter Medications as of 07/01/2017  Medication Sig  . Aclidinium Bromide (TUDORZA PRESSAIR) 400 MCG/ACT AEPB Inhale 2 puffs into the lungs daily. (Patient taking differently: Inhale 1 puff into the lungs 2 (two) times daily. )  . albuterol (PROVENTIL) (2.5 MG/3ML) 0.083% nebulizer solution Take 3 mLs (2.5  mg total) by nebulization every 6 (six) hours as needed for wheezing or shortness of breath.  [name] aspirin EC 325 MG tablet Take 1 tablet (325 mg total) by mouth daily.  [name] atorvastatin (LIPITOR) 20 MG tablet Take 1 tablet (20 mg total) by mouth daily.  . calcium acetate (PHOSLO) 667 MG capsule Take 667-1,334 mg by mouth 3 (three) times daily with meals.   . chlorpheniramine-HYDROcodone (TUSSIONEX PENNKINETIC ER) 10-8 MG/5ML SUER Take 5 mLs by mouth every 12 (twelve) hours as needed for cough.  . cholecalciferol (VITAMIN D) 1000 units tablet Take 1,000 Units  by mouth daily.  [name] ethyl chloride spray Apply 1 application topically See admin instructions. For dialysis  . methocarbamol (ROBAXIN) 500 MG tablet Take 500 mg by mouth 2 (two) times daily as needed for muscle spasms.  . metoprolol succinate (TOPROL-XL) 50 MG 24 hr tablet Take 50 mg by mouth 2 (two) times daily. Take with or immediately following a meal.   . midodrine (PROAMATINE) 10 MG tablet Take 10 mg by mouth as needed. At dialysis for low pressure  . multivitamin (RENA-VIT) TABS tablet Take 1 tablet by mouth daily.  . OXYGEN Inhale 1 L into the lungs at bedtime.   . predniSONE (DELTASONE) 10 MG tablet Take 6 tablets (60 mg total) by mouth daily with breakfast. Take 6 tabs tomorrow and then decrease by 1 tab daily until finished  . SYMBICORT 160-4.5 MCG/ACT inhaler INHALE 2 PUFFS TWICE DAILY  . [DISCONTINUED] azithromycin (ZITHROMAX) 250 MG tablet Take 1 tablet (250 mg total) by mouth daily. (Patient not taking: Reported on 07/01/2017)   No facility-administered encounter medications on file as of 07/01/2017.      Review of Systems  Constitutional:   No  weight loss, night sweats,  Fevers, chills, + fatigue, or  lassitude.  HEENT:   No headaches,  Difficulty swallowing,  Tooth/dental problems, or  Sore throat,                No sneezing, itching, ear ache, nasal congestion, post nasal drip,   CV:  No chest pain,  Orthopnea, PND, swelling in lower extremities, anasarca, dizziness, palpitations, syncope.   GI  No heartburn, indigestion, abdominal pain, nausea, vomiting, diarrhea, change in bowel habits, loss of appetite, bloody stools.   Resp:   No chest wall deformity  Skin: no rash or lesions.  GU: no dysuria, change in color of urine, no urgency or frequency.  No flank pain, no hematuria   MS:  No joint pain or swelling.  No decreased range of motion.  No back pain.    Physical Exam  BP 132/62 (BP Location: Left Arm, Cuff Size: Normal)   Pulse (!) 57   Ht 5\' 8"  (1.727 m)    Wt 153 lb (69.4 kg)   SpO2 92%   BMI 23.26 kg/m   GEN: A/Ox3; pleasant , NAD, eldery on O2    HEENT:  [place]/AT,  EACs-clear, TMs-wnl, NOSE-clear, THROAT-clear, no lesions, no postnasal drip or exudate noted.   NECK:  Supple w/ fair ROM; no JVD; normal carotid impulses w/o bruits; no thyromegaly or nodules palpated; no lymphadenopathy.    RESP   decreased breath sounds in the bases  no accessory muscle use, no dullness to percussion  CARD:  RRR, no m/r/g, tr  peripheral edema, pulses intact, no cyanosis or clubbing.  GI:   Soft & nt; nml bowel sounds; no organomegaly or masses detected.   Musco: Warm bil, no deformities or  joint swelling noted.   Neuro: alert, no focal deficits noted.    Skin: Warm, no lesions or rashes    Lab Results:  CBC    Imaging: Dg Chest 2 View  Result Date: 06/20/2017 CLINICAL DATA:  Productive cough for 1 week EXAM: CHEST  2 VIEW COMPARISON:  May 20, 2017 FINDINGS: The heart size and mediastinal contours are within normal limits. There is no focal infiltrate, pulmonary edema, or pleural effusion. The lungs are hyperinflated. Mild atelectasis of right lung base is noted. The visualized skeletal structures are unremarkable. IMPRESSION: No active cardiopulmonary disease. Emphysema. Mild atelectasis of right lung base. Electronically Signed   By: Abelardo Diesel M.D.   On: 06/20/2017 18:20     Assessment & Plan:   Chronic respiratory failure with hypoxia ([place]) Continue on oxygen  COPD GOLD III Recent exacerbation now improved with antibiotics and steroids.  Plan  Patient Instructions  Mucinex Twice daily  As needed  Cough/congestion .  Delsym 2 tsp Twice daily  As needed  Cough  Zyrtec 10mg  At bedtime  As needed  Drainage  Saline nasal rinses and gel As needed   Continue on Symbicort and Spiriva .  Continue on oxygen  Follow up with Dr Melvyn Novas in 3-4  weeks and As needed   Please contact office for sooner follow up if symptoms do not improve  or worsen or seek emergency care          Rexene Edison, NP 07/01/2017

## 2017-07-01 NOTE — Assessment & Plan Note (Signed)
Recent exacerbation now improved with antibiotics and steroids.  Plan  Patient Instructions  Mucinex Twice daily  As needed  Cough/congestion .  Delsym 2 tsp Twice daily  As needed  Cough  Zyrtec 10mg  At bedtime  As needed  Drainage  Saline nasal rinses and gel As needed   Continue on Symbicort and Spiriva .  Continue on oxygen  Follow up with Dr Melvyn Novas in 3-4  weeks and As needed   Please contact office for sooner follow up if symptoms do not improve or worsen or seek emergency care

## 2017-07-01 NOTE — Patient Instructions (Addendum)
Mucinex Twice daily  As needed  Cough/congestion .  Delsym 2 tsp Twice daily  As needed  Cough  Zyrtec 10mg  At bedtime  As needed  Drainage  Saline nasal rinses and gel As needed   Continue on Symbicort and Spiriva .  Continue on oxygen  Follow up with Dr Melvyn Novas in 3-4  weeks and As needed   Please contact office for sooner follow up if symptoms do not improve or worsen or seek emergency care

## 2017-07-01 NOTE — Assessment & Plan Note (Signed)
Continue on oxygen 

## 2017-07-02 DIAGNOSIS — N2581 Secondary hyperparathyroidism of renal origin: Secondary | ICD-10-CM | POA: Diagnosis not present

## 2017-07-02 DIAGNOSIS — N186 End stage renal disease: Secondary | ICD-10-CM | POA: Diagnosis not present

## 2017-07-02 NOTE — Progress Notes (Signed)
Chart and office note reviewed in detail  > agree with a/p as outlined    

## 2017-07-04 DIAGNOSIS — J449 Chronic obstructive pulmonary disease, unspecified: Secondary | ICD-10-CM | POA: Diagnosis not present

## 2017-07-05 DIAGNOSIS — N186 End stage renal disease: Secondary | ICD-10-CM | POA: Diagnosis not present

## 2017-07-05 DIAGNOSIS — N2581 Secondary hyperparathyroidism of renal origin: Secondary | ICD-10-CM | POA: Diagnosis not present

## 2017-07-07 ENCOUNTER — Other Ambulatory Visit: Payer: Self-pay

## 2017-07-07 ENCOUNTER — Emergency Department (HOSPITAL_COMMUNITY)
Admission: EM | Admit: 2017-07-07 | Discharge: 2017-07-07 | Disposition: A | Discharge status: Home / Self Care | Payer: Medicare PPO | Attending: Emergency Medicine | Admitting: Emergency Medicine

## 2017-07-07 ENCOUNTER — Encounter (HOSPITAL_COMMUNITY): Payer: Self-pay

## 2017-07-07 DIAGNOSIS — Z992 Dependence on renal dialysis: Secondary | ICD-10-CM | POA: Insufficient documentation

## 2017-07-07 DIAGNOSIS — Z955 Presence of coronary angioplasty implant and graft: Secondary | ICD-10-CM | POA: Diagnosis not present

## 2017-07-07 DIAGNOSIS — Z79899 Other long term (current) drug therapy: Secondary | ICD-10-CM | POA: Diagnosis not present

## 2017-07-07 DIAGNOSIS — I2510 Atherosclerotic heart disease of native coronary artery without angina pectoris: Secondary | ICD-10-CM | POA: Insufficient documentation

## 2017-07-07 DIAGNOSIS — Y69 Unspecified misadventure during surgical and medical care: Secondary | ICD-10-CM | POA: Diagnosis not present

## 2017-07-07 DIAGNOSIS — Z8673 Personal history of transient ischemic attack (TIA), and cerebral infarction without residual deficits: Secondary | ICD-10-CM | POA: Insufficient documentation

## 2017-07-07 DIAGNOSIS — I132 Hypertensive heart and chronic kidney disease with heart failure and with stage 5 chronic kidney disease, or end stage renal disease: Secondary | ICD-10-CM | POA: Diagnosis not present

## 2017-07-07 DIAGNOSIS — T82838A Hemorrhage of vascular prosthetic devices, implants and grafts, initial encounter: Secondary | ICD-10-CM | POA: Insufficient documentation

## 2017-07-07 DIAGNOSIS — N186 End stage renal disease: Secondary | ICD-10-CM | POA: Diagnosis not present

## 2017-07-07 DIAGNOSIS — J449 Chronic obstructive pulmonary disease, unspecified: Secondary | ICD-10-CM | POA: Diagnosis not present

## 2017-07-07 DIAGNOSIS — N2581 Secondary hyperparathyroidism of renal origin: Secondary | ICD-10-CM | POA: Diagnosis not present

## 2017-07-07 DIAGNOSIS — Z7982 Long term (current) use of aspirin: Secondary | ICD-10-CM | POA: Insufficient documentation

## 2017-07-07 DIAGNOSIS — T82898A Other specified complication of vascular prosthetic devices, implants and grafts, initial encounter: Secondary | ICD-10-CM | POA: Diagnosis not present

## 2017-07-07 DIAGNOSIS — Z87891 Personal history of nicotine dependence: Secondary | ICD-10-CM | POA: Diagnosis not present

## 2017-07-07 DIAGNOSIS — I5032 Chronic diastolic (congestive) heart failure: Secondary | ICD-10-CM | POA: Diagnosis not present

## 2017-07-07 DIAGNOSIS — Z452 Encounter for adjustment and management of vascular access device: Secondary | ICD-10-CM | POA: Diagnosis present

## 2017-07-07 DIAGNOSIS — T83498A Other mechanical complication of other prosthetic devices, implants and grafts of genital tract, initial encounter: Secondary | ICD-10-CM | POA: Diagnosis not present

## 2017-07-07 NOTE — ED Triage Notes (Signed)
Pt arrived via [facility] from dialysis after receiving full treatment today. Dialysis called for uncontrolled bleeding, EMS reports bleeding was controlled upon their arrival and is still controlled at this time. Pt request to be monitored r/t history of bleeding restarting in area.

## 2017-07-07 NOTE — ED Provider Notes (Signed)
[facility] EMERGENCY DEPARTMENT Provider Note   CSN: [id number] Arrival date & time: 07/07/17  [place]     History   Chief Complaint Chief Complaint  Patient presents with  . Vascular Access Problem    HPI Terry [name]. is a 83 y.o. male.  HPI Patient presents to the emergency room for evaluation of bleeding from his AV fistula for dialysis.  Patient was at dialysis today and when they remove the needles the puncture wounds continued to bleed.  They were unable to control the bleeding at the dialysis center so they called EMS.  By the time EMs had arrived the bleeding was already controlled.  Patient denies any other complaints.  No chest pain.  No difficulty with his breathing more than usual.  No abdominal pain.  He has not noticed any bleeding elsewhere and does not think he lost any significant amount of blood. Past Medical History:  Diagnosis Date  . Acute and chronic respiratory failure 10/22/2012  . Anxiety   . Arthritis    "hands, knees" (03/15/2017)  . Atrial fibrillation ([place])   . CAD (coronary artery disease) [place], [name]  . CHF (congestive heart failure) ([place]) 09/2012  . COPD (chronic obstructive pulmonary disease) ([place])   . ESRD (end stage renal disease) on dialysis [facility])    "MWF; [facility] [place]" (03/15/2017)  . Flu 09/24/2016   influenza b  . HCAP (healthcare-associated pneumonia) 03/15/2017  . High cholesterol   . History of blood transfusion 2012   "related to bowel resection"  . Hypertension   . On home oxygen therapy    "1L typically at night" (03/15/2017)  . Osteoarthritis 10/22/2012  . Pneumonia April 2014; 09/2016  . Shortness of breath dyspnea   . Stroke [facility]) 2012   denies residual on 03/15/2017  . Stroke [facility]) ~ 2013   "small one in his right eye"    Patient Active Problem List   Diagnosis Date Noted  . Coronavirus infection 06/22/2017  . ESRD on dialysis ([place])   . Hypoxia   . COPD with acute  exacerbation ([place]) 06/21/2017  . Sepsis ([place]) 06/21/2017  . Hypertension 03/15/2017  . Chest pain 03/15/2017  . High cholesterol 03/15/2017  . CKD (chronic kidney disease) stage V requiring chronic dialysis ([place]) 03/15/2017  . COPD (chronic obstructive pulmonary disease) GOLD stage III 03/15/2017  . Chronic hypoxemic respiratory failure ([place]) 03/15/2017  . PAF (paroxysmal atrial fibrillation) ([place]) 03/15/2017  . Acute on chronic respiratory failure with hypoxia ([place])   . Pulmonary infiltrates on CXR 02/10/2017  . Upper airway cough syndrome 08/13/2016  . Memory loss of unknown cause 04/08/2016  . Chronic respiratory failure with hypoxia ([place]) 08/27/2015  . ESRD (end stage renal disease) on dialysis ([place]) 07/28/2015  . HCAP (healthcare-associated pneumonia)   . Pneumothorax, left   . Chronic diastolic heart failure ([place])   . Anemia of chronic disease   . Pneumonia due to Enterobacter cloacae ([place])   . Pneumonia due to Klebsiella pneumoniae ([place])   . Chronic atrial fibrillation ([place])   . CAD (coronary artery disease) 02/04/2015  . Hyperkalemia 04/13/2014  . COPD GOLD III 10/22/2012  . Other and unspecified hyperlipidemia 10/22/2012  . Osteoarthritis 10/22/2012  . Essential hypertension 10/22/2012    Past Surgical History:  Procedure Laterality Date  . APPENDECTOMY  1984  . AV FISTULA PLACEMENT Right 05/21/2015   Procedure: Right Arm Brachiocephalic ARTERIOVENOUS (AV) FISTULA CREATION;  Surgeon: Angelia Mould,  MD;  Location: [facility];  Service: Vascular;  Laterality: Right;  . CATARACT EXTRACTION W/ INTRAOCULAR LENS  IMPLANT, BILATERAL Bilateral   . COLECTOMY  2012   partial colon removed - twisted bowel  . CORONARY ANGIOPLASTY WITH STENT PLACEMENT  1999   2 stents  . CYSTOSCOPY/RETROGRADE/URETEROSCOPY Bilateral 04/23/2014   Procedure: CYSTOSCOPY BILATERAL RETROGRADE,  LEFT URETEROSCOPY, LEFT STENT PLACEMENT;  Surgeon: Raynelle Bring, MD;  Location: [facility];  Service: Urology;   Laterality: Bilateral;  . EYE SURGERY Left    "surgery to clean off lens after cataract OR"  . INGUINAL HERNIA REPAIR Right        Home Medications    Prior to Admission medications   Medication Sig Start Date End Date Taking? Authorizing Provider  Aclidinium Bromide (TUDORZA PRESSAIR) 400 MCG/ACT AEPB Inhale 2 puffs into the lungs daily. Patient taking differently: Inhale 1 puff into the lungs 2 (two) times daily.  06/16/17   Tanda Rockers, MD  albuterol (PROVENTIL) (2.5 MG/3ML) 0.083% nebulizer solution Take 3 mLs (2.5 mg total) by nebulization every 6 (six) hours as needed for wheezing or shortness of breath. 03/18/17   Elwin Mocha, MD  aspirin EC 325 MG tablet Take 1 tablet (325 mg total) by mouth daily. 09/26/15   Burnell [place]anks, MD  atorvastatin (LIPITOR) 20 MG tablet Take 1 tablet (20 mg total) by mouth daily. 09/21/16   Burnell Blanks, MD  calcium acetate (PHOSLO) 667 MG capsule Take 667-1,334 mg by mouth 3 (three) times daily with meals.  01/09/16   [provider]  chlorpheniramine-HYDROcodone (TUSSIONEX PENNKINETIC ER) 10-8 MG/5ML SUER Take 5 mLs by mouth every 12 (twelve) hours as needed for cough. 06/22/17   Debbe Odea, MD  cholecalciferol (VITAMIN D) 1000 units tablet Take 1,000 Units by mouth daily.    [provider]  ethyl chloride spray Apply 1 application topically See admin instructions. For dialysis 05/28/17   [provider]  methocarbamol (ROBAXIN) 500 MG tablet Take 500 mg by mouth 2 (two) times daily as needed for muscle spasms.    [provider]  metoprolol succinate (TOPROL-XL) 50 MG 24 hr tablet Take 50 mg by mouth 2 (two) times daily. Take with or immediately following a meal.     [provider]  midodrine (PROAMATINE) 10 MG tablet Take 10 mg by mouth as needed. At dialysis for low pressure    [provider]  multivitamin (RENA-VIT) TABS tablet Take 1 tablet by mouth daily.    [provider]  OXYGEN Inhale 1 L into the lungs at bedtime.     [provider]  predniSONE (DELTASONE) 10 MG tablet Take 6 tablets (60 mg total) by mouth daily with breakfast. Take 6 tabs tomorrow and then decrease by 1 tab daily until finished 06/22/17   Debbe Odea, MD  SYMBICORT 160-4.5 MCG/ACT inhaler INHALE 2 PUFFS TWICE DAILY 11/30/16   Tanda Rockers, MD    Family History Family History  Problem Relation Age of Onset  . Cancer Mother        Breast  . Heart disease Father   . Heart attack Father   . Parkinson's disease Brother     Social History Social History   Tobacco Use  . Smoking status: Former Smoker    Packs/day: 1.00    Years: 49.00    Pack years: 49.00    Types: Cigarettes, Pipe, Cigars    Last attempt to quit: 02/21/2000  Years since quitting: 17.3  . Smokeless tobacco: Former Systems developer    Types: Chew  . Tobacco comment: "chewed when he was a kid"  Substance Use Topics  . Alcohol use: Yes    Comment: 03/15/2017 "nothing since the 1960's"  . Drug use: No     Allergies   Yellow dyes (non-tartrazine) and Levaquin [levofloxacin in d5w]   Review of Systems Review of Systems  All other systems reviewed and are negative.    Physical Exam Updated Vital Signs BP (!) 150/48 (BP Location: Left Arm)   Pulse 73   Temp 98.2 F (36.8 C) (Oral)   Ht 1.727 m (5\' 8" )   Wt 69.4 kg (153 lb)   SpO2 98%   BMI 23.26 kg/m   Physical Exam  Constitutional: He appears well-developed and well-nourished. No distress.  HENT:  Head: Normocephalic and atraumatic.  Right Ear: External ear normal.  Left Ear: External ear normal.  Eyes: Conjunctivae are normal. Right eye exhibits no discharge. Left eye exhibits no discharge. No scleral icterus.  Neck: Neck supple. No tracheal deviation present.  Cardiovascular: Normal rate.  Pulmonary/Chest: Effort normal. No stridor. No respiratory distress. He has no wheezes. He has no rales.  Abdominal: He exhibits no  distension.  Musculoskeletal: He exhibits no edema.  Neurological: He is alert. Cranial nerve deficit: no gross deficits.  Skin: Skin is warm and dry. No rash noted.  Psychiatric: He has a normal mood and affect.  Nursing note and vitals reviewed.    ED Treatments / Results   Procedures Procedures (including critical care time)  Medications Ordered in ED Medications - No data to display   Initial Impression / Assessment and Plan / ED Course  I have reviewed the triage vital signs and the nursing notes.  Pertinent labs & imaging results that were available during my care of the patient were reviewed by me and considered in my medical decision making (see chart for details).   Patient presented to the emergency room for evaluation of bleeding from his graft.  Bleeding stopped before arrival.  Patient was monitored in the emergency room for a couple of hours.  No further bleeding was noted.  His dressing remained clean and dry.  Patient feels fine now and is ready to go home.  Final Clinical Impressions(s) / ED Diagnoses   Final diagnoses:  Bleeding from dialysis shunt, initial encounter [facility])    ED Discharge Orders    None       Dorie Rank, MD 07/07/17 2016

## 2017-07-07 NOTE — ED Notes (Signed)
Pt departed in NAD. Dressing remains intact with no new bleeding noted.

## 2017-07-07 NOTE — Discharge Instructions (Signed)
Continue your current medications, leave the dressing on overnight, return as needed for worsening symptoms

## 2017-07-09 DIAGNOSIS — N186 End stage renal disease: Secondary | ICD-10-CM | POA: Diagnosis not present

## 2017-07-09 DIAGNOSIS — Z992 Dependence on renal dialysis: Secondary | ICD-10-CM | POA: Diagnosis not present

## 2017-07-12 DIAGNOSIS — N2581 Secondary hyperparathyroidism of renal origin: Secondary | ICD-10-CM | POA: Diagnosis not present

## 2017-07-12 DIAGNOSIS — N186 End stage renal disease: Secondary | ICD-10-CM | POA: Diagnosis not present

## 2017-07-14 ENCOUNTER — Telehealth: Payer: Self-pay | Admitting: Internal Medicine

## 2017-07-14 DIAGNOSIS — N2581 Secondary hyperparathyroidism of renal origin: Secondary | ICD-10-CM | POA: Diagnosis not present

## 2017-07-14 DIAGNOSIS — N186 End stage renal disease: Secondary | ICD-10-CM | POA: Diagnosis not present

## 2017-07-14 NOTE — Telephone Encounter (Signed)
ATC call patient and his wife, neither have a VM that is setup. Will call back later.

## 2017-07-14 NOTE — Telephone Encounter (Signed)
Patient's wife Thayer Headings is returning call, [phone].

## 2017-07-14 NOTE — Telephone Encounter (Signed)
All I can offer over the phone is to go back to prednisone 20 mg now and daily until better then don't drop below 10 mg per day until return with all meds in hand to regroup w/in a week - ok to add on at a time they can come in > to ER in meantime if worsen

## 2017-07-14 NOTE — Telephone Encounter (Signed)
Spoke with pt's wife, pt is having problem with his breathing this morning. He is still coughing and it seems severe with sinus drainage. Denies fever and a little sore throat. He is taking Zyrtec for the sinus drainage. He is not getting better and was just in the hospital and did have a URI infection. He just feels really bad but has to go to dialysis at 11:30 and will not be able to come in because he will be there until 5pm. MW please advise.   [place] [employer]  Last OV with TP  Assessment & Plan:   Chronic respiratory failure with hypoxia (HCC) Continue on oxygen  COPD GOLD III Recent exacerbation now improved with antibiotics and steroids.  Plan  Patient Instructions  Mucinex Twice daily  As needed  Cough/congestion .  Delsym 2 tsp Twice daily  As needed  Cough  Zyrtec 10mg  At bedtime  As needed  Drainage  Saline nasal rinses and gel As needed   Continue on Symbicort and Spiriva .  Continue on oxygen  Follow up with Dr Melvyn Novas in 3-4  weeks and As needed   Please contact office for sooner follow up if symptoms do not improve or worsen or seek emergency care

## 2017-07-14 NOTE — Telephone Encounter (Signed)
Spoke to patient's wife-Janice Advised her of MW advise to go back to prednisone 20mg  now and daily until better then don't drop below 10mg  per day until return with all meds in hand to regroup with in a week for ROV. Offered pt's wife appt today with MW, she denied as pt is in dialysis at this time at [facility] advised that patient is not breathing well with O2, having increase of SOB  Advised pt's wife take 20mg  prednisone today  Offered appt again for pt to be seen by TP tomorrow Pt's wife agreed to be seen tomorrow 07/15/2017 at [place] with TP  Pt's wife stated pt is not taking mucinex twice daily, and saline nasal rinses and gel as needed on AVS of last ov. Advised that pt is needed to take medication as recommended.  Advised if pt worsens in symptoms after dialysis treatment today,  pt is done with dialysis after 4:15pm off [address] in [place] pt would need to seek Urgent care or ER Nothing further needed at this time

## 2017-07-15 ENCOUNTER — Ambulatory Visit: Payer: Medicare PPO | Admitting: Adult Health

## 2017-07-15 ENCOUNTER — Encounter: Payer: Self-pay | Admitting: Adult Health

## 2017-07-15 ENCOUNTER — Ambulatory Visit (INDEPENDENT_AMBULATORY_CARE_PROVIDER_SITE_OTHER)
Admission: RE | Admit: 2017-07-15 | Discharge: 2017-07-15 | Disposition: A | Discharge status: Home / Self Care | Payer: Medicare PPO | Source: Ambulatory Visit | Attending: Adult Health | Admitting: Adult Health

## 2017-07-15 ENCOUNTER — Telehealth: Payer: Self-pay | Admitting: Internal Medicine

## 2017-07-15 ENCOUNTER — Telehealth: Payer: Self-pay | Admitting: Adult Health

## 2017-07-15 DIAGNOSIS — I5032 Chronic diastolic (congestive) heart failure: Secondary | ICD-10-CM | POA: Diagnosis not present

## 2017-07-15 DIAGNOSIS — J441 Chronic obstructive pulmonary disease with (acute) exacerbation: Secondary | ICD-10-CM | POA: Diagnosis not present

## 2017-07-15 DIAGNOSIS — R05 Cough: Secondary | ICD-10-CM | POA: Diagnosis not present

## 2017-07-15 DIAGNOSIS — J9611 Chronic respiratory failure with hypoxia: Secondary | ICD-10-CM | POA: Diagnosis not present

## 2017-07-15 DIAGNOSIS — R0602 Shortness of breath: Secondary | ICD-10-CM | POA: Diagnosis not present

## 2017-07-15 MED ORDER — CIPROFLOXACIN HCL 500 MG PO TABS: 500.0000 mg | ORAL_TABLET | Freq: Two times a day (BID) | ORAL | 0 refills | Status: AC

## 2017-07-15 MED ORDER — LEVALBUTEROL HCL 0.63 MG/3ML IN NEBU
0.6300 mg | INHALATION_SOLUTION | Freq: Once | RESPIRATORY_TRACT | Status: AC
  Administered 2017-07-15: 0.63 mg via RESPIRATORY_TRACT

## 2017-07-15 NOTE — Progress Notes (Signed)
Chart and office note reviewed in detail  > agree with a/p as outlined    

## 2017-07-15 NOTE — Assessment & Plan Note (Signed)
Appears compensated .  

## 2017-07-15 NOTE — Assessment & Plan Note (Signed)
Recurrent flare  Hx of psuedomonas infection in past  Check sputum cx ,  Begin Cipro and steroid burst  Plan  Patient Instructions  Cipro 500mg  Twice daily  For 1 week  Mucinex DM Twice daily As needed  Cough /congestion  Continue on Prednisone 20mg  daily for 1 week then 10mg  daily , hold at this dose.  Sputum culture .  Order for new neb machine .  Chest xray today .  Continue on oxygen .  Follow up with Dr. Melvyn Novas  As planned and As needed   Please contact office for sooner follow up if symptoms do not improve or worsen or seek emergency care

## 2017-07-15 NOTE — Telephone Encounter (Signed)
Plan  Patient Instructions  Cipro 500mg  Twice daily  For 1 week  Mucinex DM Twice daily As needed  Cough /congestion  Continue on Prednisone 20mg  daily for 1 week then 10mg  daily , hold at this dose.  Sputum culture .  Order for new neb machine .    Called the pharmacy and they are aware that the cipro should be for 1 week---14 days.  This has been fixed and nothing further is needed.

## 2017-07-15 NOTE — Patient Instructions (Signed)
Cipro 500mg  Twice daily  For 1 week  Mucinex DM Twice daily As needed  Cough /congestion  Continue on Prednisone 20mg  daily for 1 week then 10mg  daily , hold at this dose.  Sputum culture .  Order for new neb machine .  Chest xray today .  Continue on oxygen .  Follow up with Dr. Melvyn Novas  As planned and As needed   Please contact office for sooner follow up if symptoms do not improve or worsen or seek emergency care

## 2017-07-15 NOTE — Assessment & Plan Note (Signed)
Cont on oxygen

## 2017-07-15 NOTE — Addendum Note (Signed)
Addended by: Parke Poisson E on: 07/15/2017 10:01 AM   Modules accepted: Orders

## 2017-07-15 NOTE — Telephone Encounter (Signed)
Notes recorded by Melvenia Needles, NP on 07/15/2017 at 11:45 AM EST CXR is okay , no sign of PNA  Cont w/ ov recs Please contact office for sooner follow up if symptoms do not improve or worsen or seek emergency care  ---------- Spoke with pt's spouse Terry [name] (dpr on file), aware of results/recs.  Nothing further needed.

## 2017-07-15 NOTE — Progress Notes (Signed)
@Patient  ID: Terry [name]., male    DOB: [birth year], 83 y.o.   MRN: [id number]  Chief Complaint  Patient presents with  . Acute Visit    COPD     Referring provider: [name], DO  HPI: 83 yo male former smoker followed for GOLD III COPD , chronic resp failure on Oxygen , chronic steroids on prednisone 5mg daily. End-stage renal disease on dialysis  TEST PFT's 01/26/13 FEV1 1.28 (48%) ratio 48 and no better p B2, DLCO 39 corrects to 48% - PFTs 02/26/2015 FEV1 0.99 (35 % ) ratio 35 p 11 % improvement from [place]aba with DLCO 24 %  07/15/2017 Acute OV : COPD  Patient presents for an acute office visit.  He complains that he has had a persistent cough and congestion with white mucus.  Patient was recently hospitalized early January for COPD exacerbation.  He was treated with IV antibiotics and steroids.  He was discharged on azithromycin and a prednisone taper.  He is back down to his daily prednisone dose of 5 mg.  He remains on Symbicort and [place].  Neb machine is broken .  Has had Psuedomonas + sputum cx in past -pansensitive .  No chest pain , orthopnea, increased edema, or hemoptysis , no fever.   Patient has end-stage renal disease on hemodialysis Monday Wednesday and Friday.  Has not missed any sessions.  Weight is been stable. .  Allergies  Allergen Reactions  . Yellow Dyes (Non-Tartrazine) Other (See Comments)    Burns arms Cough medications (tussionex and benzonatate ok)  . Levaquin [Levofloxacin In D5w] Itching    Immunization History  Administered Date(s) Administered  . Influenza Split 02/21/2012  . Influenza Whole 03/23/2016  . Influenza, High Dose Seasonal PF 03/30/2013, 02/18/2017  . Influenza,inj,Quad PF,6+ Mos 02/26/2015  . Influenza-Unspecified 04/06/2014  . Pneumococcal Conjugate-13 04/07/2016  . Pneumococcal Polysaccharide-23 02/21/2012    Past Medical History:  Diagnosis Date  . Acute and chronic respiratory failure 10/22/2012  .  Anxiety   . Arthritis    "hands, knees" (03/15/2017)  . Atrial fibrillation ([place])   . CAD (coronary artery disease) [place], [name]  . CHF (congestive heart failure) ([place]) 09/2012  . COPD (chronic obstructive pulmonary disease) ([place])   . ESRD (end stage renal disease) on dialysis [facility])    "MWF; [facility] [place]" (03/15/2017)  . Flu 09/24/2016   influenza b  . HCAP (healthcare-associated pneumonia) 03/15/2017  . High cholesterol   . History of blood transfusion 2012   "related to bowel resection"  . Hypertension   . On home oxygen therapy    "1L typically at night" (03/15/2017)  . Osteoarthritis 10/22/2012  . Pneumonia April 2014; 09/2016  . Shortness of breath dyspnea   . Stroke [facility]) 2012   denies residual on 03/15/2017  . Stroke [facility]) ~ 2013   "small one in his right eye"    Tobacco History: Social History   Tobacco Use  Smoking Status Former Smoker  . Packs/day: 1.00  . Years: 49.00  . Pack years: 49.00  . Types: Cigarettes, Pipe, Cigars  . Last attempt to quit: 02/21/2000  . Years since quitting: 17.4  Smokeless Tobacco Former Systems developer  . Types: Chew  Tobacco Comment   "chewed when he was a kid"   Counseling given: Not Answered Comment: "chewed when he was a kid"   Outpatient Encounter Medications as of 07/15/2017  Medication Sig  . Aclidinium Bromide (TUDORZA PRESSAIR) 400 MCG/ACT  AEPB Inhale 2 puffs into the lungs daily. (Patient taking differently: Inhale 1 puff into the lungs 2 (two) times daily. )  . albuterol (PROVENTIL) (2.5 MG/3ML) 0.083% nebulizer solution Take 3 mLs (2.5 mg total) by nebulization every 6 (six) hours as needed for wheezing or shortness of breath.  [name] aspirin EC 325 MG tablet Take 1 tablet (325 mg total) by mouth daily.  [name] atorvastatin (LIPITOR) 20 MG tablet Take 1 tablet (20 mg total) by mouth daily.  . calcium acetate (PHOSLO) 667 MG capsule Take 667-1,334 mg by mouth 3 (three) times daily with meals.   .  chlorpheniramine-HYDROcodone (TUSSIONEX PENNKINETIC ER) 10-8 MG/5ML SUER Take 5 mLs by mouth every 12 (twelve) hours as needed for cough.  . cholecalciferol (VITAMIN D) 1000 units tablet Take 1,000 Units by mouth daily.  [name] ethyl chloride spray Apply 1 application topically See admin instructions. For dialysis  . methocarbamol (ROBAXIN) 500 MG tablet Take 500 mg by mouth 2 (two) times daily as needed for muscle spasms.  . metoprolol succinate (TOPROL-XL) 50 MG 24 hr tablet Take 50 mg by mouth 2 (two) times daily. Take with or immediately following a meal.   . midodrine (PROAMATINE) 10 MG tablet Take 10 mg by mouth as needed. At dialysis for low pressure  . multivitamin (RENA-VIT) TABS tablet Take 1 tablet by mouth daily.  . OXYGEN Inhale 1 L into the lungs at bedtime.   . predniSONE (DELTASONE) 10 MG tablet Take 6 tablets (60 mg total) by mouth daily with breakfast. Take 6 tabs tomorrow and then decrease by 1 tab daily until finished  . SYMBICORT 160-4.5 MCG/ACT inhaler INHALE 2 PUFFS TWICE DAILY  . ciprofloxacin (CIPRO) 500 MG tablet Take 1 tablet (500 mg total) by mouth 2 (two) times daily for 10 days.   No facility-administered encounter medications on file as of 07/15/2017.      Review of Systems  Constitutional:   No  weight loss, night sweats,  Fevers, chills,  +fatigue, or  lassitude.  HEENT:   No headaches,  Difficulty swallowing,  Tooth/dental problems, or  Sore throat,                No sneezing, itching, ear ache,  +nasal congestion, post nasal drip,   CV:  No chest pain,  Orthopnea, PND, swelling in lower extremities, anasarca, dizziness, palpitations, syncope.   GI  No heartburn, indigestion, abdominal pain, nausea, vomiting, diarrhea, change in bowel habits, loss of appetite, bloody stools.   Resp:  .  No chest wall deformity  Skin: no rash or lesions.  GU: no dysuria, change in color of urine, no urgency or frequency.  No flank pain, no hematuria   MS:  No joint pain  or swelling.  No decreased range of motion.  No back pain.    Physical Exam  BP 138/64 (BP Location: Left Arm, Cuff Size: Normal)   Pulse (!) 104   Ht 5\' 8"  (1.727 m)   Wt 156 lb 3.2 oz (70.9 kg)   SpO2 94%   BMI 23.75 kg/m   GEN: A/Ox3; pleasant , NAD,  Elderly in wc    HEENT:  [place]/AT,  EACs-clear, TMs-wnl, NOSE-clear, THROAT-clear, no lesions, no postnasal drip or exudate noted.   NECK:  Supple w/ fair ROM; no JVD; normal carotid impulses w/o bruits; no thyromegaly or nodules palpated; no lymphadenopathy.    RESP  Decreased BS in bases ,  no accessory muscle use, no dullness to percussion  CARD:  RRR, no m/r/g, no peripheral edema, pulses intact, no cyanosis or clubbing.  GI:   Soft & nt; nml bowel sounds; no organomegaly or masses detected.   Musco: Warm bil, no deformities or joint swelling noted.   Neuro: alert, no focal deficits noted.    Skin: Warm, no lesions or rashes    Lab Results:  CBC   BMET   BNP  Imaging: Dg Chest 2 View  Result Date: 06/20/2017 CLINICAL DATA:  Productive cough for 1 week EXAM: CHEST  2 VIEW COMPARISON:  May 20, 2017 FINDINGS: The heart size and mediastinal contours are within normal limits. There is no focal infiltrate, pulmonary edema, or pleural effusion. The lungs are hyperinflated. Mild atelectasis of right lung base is noted. The visualized skeletal structures are unremarkable. IMPRESSION: No active cardiopulmonary disease. Emphysema. Mild atelectasis of right lung base. Electronically Signed   By: Abelardo Diesel M.D.   On: 06/20/2017 18:20     Assessment & Plan:   COPD with acute exacerbation ([place]) Recurrent flare  Hx of psuedomonas infection in past  Check sputum cx ,  Begin Cipro and steroid burst  Plan  Patient Instructions  Cipro 500mg  Twice daily  For 1 week  Mucinex DM Twice daily As needed  Cough /congestion  Continue on Prednisone 20mg  daily for 1 week then 10mg  daily , hold at this dose.  Sputum  culture .  Order for new neb machine .  Chest xray today .  Continue on oxygen .  Follow up with Dr. Melvyn Novas  As planned and As needed   Please contact office for sooner follow up if symptoms do not improve or worsen or seek emergency care       Chronic respiratory failure with hypoxia ([place]) Cont on oxygen   Chronic diastolic heart failure ([place]) Appears compensated.      Rexene Edison, NP 07/15/2017

## 2017-07-16 ENCOUNTER — Telehealth: Payer: Self-pay | Admitting: Internal Medicine

## 2017-07-16 ENCOUNTER — Observation Stay (HOSPITAL_COMMUNITY)
Admission: EM | Admit: 2017-07-16 | Discharge: 2017-07-18 | Disposition: A | Discharge status: Home / Self Care | Payer: Medicare PPO | Attending: Internal Medicine | Admitting: Internal Medicine

## 2017-07-16 ENCOUNTER — Other Ambulatory Visit: Payer: Self-pay

## 2017-07-16 ENCOUNTER — Emergency Department (HOSPITAL_COMMUNITY): Payer: Medicare PPO

## 2017-07-16 ENCOUNTER — Encounter (HOSPITAL_COMMUNITY): Payer: Self-pay | Admitting: Emergency Medicine

## 2017-07-16 DIAGNOSIS — D649 Anemia, unspecified: Secondary | ICD-10-CM

## 2017-07-16 DIAGNOSIS — Z992 Dependence on renal dialysis: Secondary | ICD-10-CM

## 2017-07-16 DIAGNOSIS — I4891 Unspecified atrial fibrillation: Secondary | ICD-10-CM | POA: Diagnosis not present

## 2017-07-16 DIAGNOSIS — R413 Other amnesia: Secondary | ICD-10-CM | POA: Diagnosis present

## 2017-07-16 DIAGNOSIS — I10 Essential (primary) hypertension: Secondary | ICD-10-CM | POA: Diagnosis present

## 2017-07-16 DIAGNOSIS — Z8673 Personal history of transient ischemic attack (TIA), and cerebral infarction without residual deficits: Secondary | ICD-10-CM | POA: Diagnosis not present

## 2017-07-16 DIAGNOSIS — Z79899 Other long term (current) drug therapy: Secondary | ICD-10-CM | POA: Insufficient documentation

## 2017-07-16 DIAGNOSIS — J439 Emphysema, unspecified: Secondary | ICD-10-CM | POA: Diagnosis not present

## 2017-07-16 DIAGNOSIS — R2681 Unsteadiness on feet: Secondary | ICD-10-CM | POA: Diagnosis not present

## 2017-07-16 DIAGNOSIS — R062 Wheezing: Secondary | ICD-10-CM | POA: Insufficient documentation

## 2017-07-16 DIAGNOSIS — H538 Other visual disturbances: Secondary | ICD-10-CM | POA: Insufficient documentation

## 2017-07-16 DIAGNOSIS — D631 Anemia in chronic kidney disease: Secondary | ICD-10-CM | POA: Diagnosis not present

## 2017-07-16 DIAGNOSIS — Z9981 Dependence on supplemental oxygen: Secondary | ICD-10-CM | POA: Diagnosis not present

## 2017-07-16 DIAGNOSIS — I2510 Atherosclerotic heart disease of native coronary artery without angina pectoris: Secondary | ICD-10-CM | POA: Insufficient documentation

## 2017-07-16 DIAGNOSIS — I482 Chronic atrial fibrillation, unspecified: Secondary | ICD-10-CM | POA: Diagnosis present

## 2017-07-16 DIAGNOSIS — I480 Paroxysmal atrial fibrillation: Secondary | ICD-10-CM | POA: Diagnosis not present

## 2017-07-16 DIAGNOSIS — R0602 Shortness of breath: Secondary | ICD-10-CM | POA: Diagnosis not present

## 2017-07-16 DIAGNOSIS — D500 Iron deficiency anemia secondary to blood loss (chronic): Secondary | ICD-10-CM | POA: Diagnosis not present

## 2017-07-16 DIAGNOSIS — D638 Anemia in other chronic diseases classified elsewhere: Secondary | ICD-10-CM | POA: Diagnosis present

## 2017-07-16 DIAGNOSIS — I5043 Acute on chronic combined systolic (congestive) and diastolic (congestive) heart failure: Secondary | ICD-10-CM

## 2017-07-16 DIAGNOSIS — M6281 Muscle weakness (generalized): Secondary | ICD-10-CM | POA: Diagnosis not present

## 2017-07-16 DIAGNOSIS — F17290 Nicotine dependence, other tobacco product, uncomplicated: Secondary | ICD-10-CM | POA: Insufficient documentation

## 2017-07-16 DIAGNOSIS — J9621 Acute and chronic respiratory failure with hypoxia: Secondary | ICD-10-CM | POA: Diagnosis present

## 2017-07-16 DIAGNOSIS — J449 Chronic obstructive pulmonary disease, unspecified: Secondary | ICD-10-CM | POA: Diagnosis not present

## 2017-07-16 DIAGNOSIS — L0390 Cellulitis, unspecified: Secondary | ICD-10-CM | POA: Diagnosis not present

## 2017-07-16 DIAGNOSIS — N186 End stage renal disease: Secondary | ICD-10-CM

## 2017-07-16 DIAGNOSIS — I120 Hypertensive chronic kidney disease with stage 5 chronic kidney disease or end stage renal disease: Secondary | ICD-10-CM | POA: Diagnosis not present

## 2017-07-16 DIAGNOSIS — I5032 Chronic diastolic (congestive) heart failure: Secondary | ICD-10-CM | POA: Diagnosis not present

## 2017-07-16 DIAGNOSIS — K922 Gastrointestinal hemorrhage, unspecified: Secondary | ICD-10-CM | POA: Diagnosis not present

## 2017-07-16 DIAGNOSIS — I132 Hypertensive heart and chronic kidney disease with heart failure and with stage 5 chronic kidney disease, or end stage renal disease: Secondary | ICD-10-CM | POA: Insufficient documentation

## 2017-07-16 DIAGNOSIS — J9611 Chronic respiratory failure with hypoxia: Secondary | ICD-10-CM | POA: Insufficient documentation

## 2017-07-16 DIAGNOSIS — J441 Chronic obstructive pulmonary disease with (acute) exacerbation: Secondary | ICD-10-CM | POA: Diagnosis not present

## 2017-07-16 DIAGNOSIS — I5031 Acute diastolic (congestive) heart failure: Secondary | ICD-10-CM | POA: Diagnosis not present

## 2017-07-16 DIAGNOSIS — R0682 Tachypnea, not elsewhere classified: Secondary | ICD-10-CM | POA: Diagnosis not present

## 2017-07-16 DIAGNOSIS — I5033 Acute on chronic diastolic (congestive) heart failure: Secondary | ICD-10-CM | POA: Diagnosis present

## 2017-07-16 LAB — CBC WITH DIFFERENTIAL/PLATELET
Basophils Absolute: 0 10*3/uL (ref 0.0–0.1)
Basophils Relative: 0 %
EOS ABS: 0 10*3/uL (ref 0.0–0.7)
Eosinophils Relative: 0 %
HEMATOCRIT: 30.5 % — AB (ref 39.0–52.0)
HEMOGLOBIN: 9.5 g/dL — AB (ref 13.0–17.0)
LYMPHS ABS: 0.6 10*3/uL — AB (ref 0.7–4.0)
LYMPHS PCT: 5 %
MCH: 33.2 pg (ref 26.0–34.0)
MCHC: 31.1 g/dL (ref 30.0–36.0)
MCV: 106.6 fL — ABNORMAL HIGH (ref 78.0–100.0)
MONOS PCT: 3 %
Monocytes Absolute: 0.4 10*3/uL (ref 0.1–1.0)
NEUTROS ABS: 11.2 10*3/uL — AB (ref 1.7–7.7)
NEUTROS PCT: 92 %
Platelets: 157 10*3/uL (ref 150–400)
RBC: 2.86 MIL/uL — AB (ref 4.22–5.81)
RDW: 17.9 % — ABNORMAL HIGH (ref 11.5–15.5)
WBC: 12.2 10*3/uL — AB (ref 4.0–10.5)

## 2017-07-16 LAB — BASIC METABOLIC PANEL
Anion gap: 15 (ref 5–15)
BUN: 72 mg/dL — AB (ref 6–20)
CHLORIDE: 98 mmol/L — AB (ref 101–111)
CO2: 27 mmol/L (ref 22–32)
CREATININE: 5.83 mg/dL — AB (ref 0.61–1.24)
Calcium: 8.6 mg/dL — ABNORMAL LOW (ref 8.9–10.3)
GFR calc Af Amer: 9 mL/min — ABNORMAL LOW (ref >60)
GFR calc non Af Amer: 8 mL/min — ABNORMAL LOW (ref >60)
Glucose, Bld: 143 mg/dL — ABNORMAL HIGH (ref 65–99)
POTASSIUM: 5.2 mmol/L — AB (ref 3.5–5.1)
SODIUM: 140 mmol/L (ref 135–145)

## 2017-07-16 MED ORDER — IPRATROPIUM-ALBUTEROL 0.5-2.5 (3) MG/3ML IN SOLN
3.0000 mL | Freq: Four times a day (QID) | RESPIRATORY_TRACT | Status: DC
  Administered 2017-07-16 – 2017-07-18 (×6): 3 mL via RESPIRATORY_TRACT
  Filled 2017-07-16 (×8): qty 3

## 2017-07-16 MED ORDER — SODIUM CHLORIDE 0.9% FLUSH: 3.0000 mL | INTRAVENOUS | Status: DC | PRN

## 2017-07-16 MED ORDER — CIPROFLOXACIN HCL 250 MG PO TABS
500.0000 mg | ORAL_TABLET | Freq: Every day | ORAL | Status: DC
  Administered 2017-07-17 – 2017-07-18 (×2): 500 mg via ORAL
  Filled 2017-07-16 (×2): qty 2

## 2017-07-16 MED ORDER — SODIUM CHLORIDE 0.9% FLUSH
3.0000 mL | Freq: Two times a day (BID) | INTRAVENOUS | Status: DC
  Administered 2017-07-16 – 2017-07-18 (×4): 3 mL via INTRAVENOUS

## 2017-07-16 MED ORDER — METHYLPREDNISOLONE SODIUM SUCC 125 MG IJ SOLR
125.0000 mg | Freq: Once | INTRAMUSCULAR | Status: AC
  Administered 2017-07-16: 125 mg via INTRAVENOUS
  Filled 2017-07-16: qty 2

## 2017-07-16 MED ORDER — HYDROCOD POLST-CPM POLST ER 10-8 MG/5ML PO SUER: 5.0000 mL | Freq: Two times a day (BID) | ORAL | Status: DC | PRN

## 2017-07-16 MED ORDER — ATORVASTATIN CALCIUM 20 MG PO TABS
20.0000 mg | ORAL_TABLET | Freq: Every day | ORAL | Status: DC
  Administered 2017-07-16 – 2017-07-17 (×2): 20 mg via ORAL
  Filled 2017-07-16 (×2): qty 1

## 2017-07-16 MED ORDER — METOPROLOL SUCCINATE ER 50 MG PO TB24: 50.0000 mg | ORAL_TABLET | Freq: Every day | ORAL | Status: DC

## 2017-07-16 MED ORDER — ASPIRIN EC 325 MG PO TBEC
325.0000 mg | DELAYED_RELEASE_TABLET | Freq: Every day | ORAL | Status: DC
  Administered 2017-07-17 – 2017-07-18 (×2): 325 mg via ORAL
  Filled 2017-07-16 (×2): qty 1

## 2017-07-16 MED ORDER — ALBUTEROL SULFATE (2.5 MG/3ML) 0.083% IN NEBU: 2.5000 mg | INHALATION_SOLUTION | RESPIRATORY_TRACT | Status: DC | PRN

## 2017-07-16 MED ORDER — VITAMIN D 1000 UNITS PO TABS
1000.0000 [IU] | ORAL_TABLET | Freq: Every day | ORAL | Status: DC
  Administered 2017-07-17 – 2017-07-18 (×2): 1000 [IU] via ORAL
  Filled 2017-07-16 (×2): qty 1

## 2017-07-16 MED ORDER — IPRATROPIUM-ALBUTEROL 0.5-2.5 (3) MG/3ML IN SOLN
3.0000 mL | Freq: Once | RESPIRATORY_TRACT | Status: AC
  Administered 2017-07-16: 3 mL via RESPIRATORY_TRACT
  Filled 2017-07-16: qty 3

## 2017-07-16 MED ORDER — ETHYL CHLORIDE EX AERO: 1.0000 "application " | INHALATION_SPRAY | CUTANEOUS | Status: DC

## 2017-07-16 MED ORDER — CALCIUM ACETATE (PHOS BINDER) 667 MG PO CAPS: 667.0000 mg | ORAL_CAPSULE | Freq: Three times a day (TID) | ORAL | Status: DC | PRN

## 2017-07-16 MED ORDER — SODIUM CHLORIDE 0.9 % IV SOLN: 250.0000 mL | INTRAVENOUS | Status: DC | PRN

## 2017-07-16 MED ORDER — METHOCARBAMOL 500 MG PO TABS: 500.0000 mg | ORAL_TABLET | Freq: Two times a day (BID) | ORAL | Status: DC | PRN

## 2017-07-16 MED ORDER — METOPROLOL SUCCINATE ER 50 MG PO TB24
50.0000 mg | ORAL_TABLET | Freq: Two times a day (BID) | ORAL | Status: DC
  Administered 2017-07-16 – 2017-07-18 (×4): 50 mg via ORAL
  Filled 2017-07-16 (×4): qty 1

## 2017-07-16 MED ORDER — CALCIUM ACETATE (PHOS BINDER) 667 MG PO CAPS: 667.0000 mg | ORAL_CAPSULE | Freq: Three times a day (TID) | ORAL | Status: DC

## 2017-07-16 MED ORDER — CALCIUM ACETATE (PHOS BINDER) 667 MG PO CAPS
1334.0000 mg | ORAL_CAPSULE | Freq: Three times a day (TID) | ORAL | Status: DC
  Administered 2017-07-17 – 2017-07-18 (×4): 1334 mg via ORAL
  Filled 2017-07-16 (×4): qty 2

## 2017-07-16 MED ORDER — ALBUTEROL SULFATE (2.5 MG/3ML) 0.083% IN NEBU: 2.5000 mg | INHALATION_SOLUTION | Freq: Four times a day (QID) | RESPIRATORY_TRACT | Status: DC

## 2017-07-16 MED ORDER — ALBUTEROL SULFATE (2.5 MG/3ML) 0.083% IN NEBU: 2.5000 mg | INHALATION_SOLUTION | Freq: Four times a day (QID) | RESPIRATORY_TRACT | 2 refills | Status: DC | PRN

## 2017-07-16 MED ORDER — METHYLPREDNISOLONE SODIUM SUCC 125 MG IJ SOLR
80.0000 mg | Freq: Two times a day (BID) | INTRAMUSCULAR | Status: DC
  Administered 2017-07-17 – 2017-07-18 (×3): 80 mg via INTRAVENOUS
  Filled 2017-07-16 (×3): qty 2

## 2017-07-16 MED ORDER — IPRATROPIUM BROMIDE 0.02 % IN SOLN: 0.5000 mg | Freq: Four times a day (QID) | RESPIRATORY_TRACT | Status: DC

## 2017-07-16 MED ORDER — RENA-VITE PO TABS
1.0000 | ORAL_TABLET | Freq: Every day | ORAL | Status: DC
  Administered 2017-07-17 – 2017-07-18 (×2): 1 via ORAL
  Filled 2017-07-16 (×2): qty 1

## 2017-07-16 NOTE — ED Notes (Signed)
Pt is resting with eyes closed- His spouse is informed of need to hold in the department until 1930

## 2017-07-16 NOTE — ED Notes (Signed)
To radiology

## 2017-07-16 NOTE — ED Notes (Signed)
Meal provided for pt and spouse  

## 2017-07-16 NOTE — ED Notes (Signed)
Report to Lovena Le, RN  She reports patients cannot come to floor between [place]

## 2017-07-16 NOTE — ED Notes (Signed)
Call to [facility], pt needs tx

## 2017-07-16 NOTE — ED Notes (Signed)
Meal provided  Pt sitting erect with legs dangling  Pulse 152  Dr Earnest Conroy to bedside pt spouse reports he does not receive his a fib meds due to kidney dx  Pt currently 108 on monitor in a fib

## 2017-07-16 NOTE — ED Notes (Signed)
Awaiting dispo

## 2017-07-16 NOTE — Telephone Encounter (Signed)
Pt's spouse, Thayer Headings is aware that albuterol solution has been sent to preferred pharmacy.  Thayer Headings wanted to make Dr. Melvyn Novas & Tammy aware that pt is currently admitted at [facility] for exacerbation.   Will route to MW and TP as FYI.

## 2017-07-16 NOTE — Progress Notes (Signed)
PHARMACY NOTE:  ANTIMICROBIAL RENAL DOSAGE ADJUSTMENT  Current antimicrobial regimen includes a mismatch between antimicrobial dosage and estimated renal function.  As per policy approved by the Pharmacy & Therapeutics and Medical Executive Committees, the antimicrobial dosage will be adjusted accordingly.  Current antimicrobial dosage:  Cipro 500mg  po bid  Indication: Continued from home medications  Renal Function:  Estimated Creatinine Clearance: 9.8 mL/min (A) (by C-G formula based on SCr of 5.83 mg/dL (H)). []      On intermittent HD, scheduled: []      On CRRT    Antimicrobial dosage has been changed to:  Cipro 500mg  daily  Additional comments:   Thank you for allowing pharmacy to be a part of this patient's care.  Vernie Ammons, [facility] 07/16/2017 7:44 PM

## 2017-07-16 NOTE — H&P (Signed)
History and Physical    Terry [name]. VPX:[id number] DOB: [birth year] DOA: 07/16/2017  PCP: [name], DO  Patient coming from: Home  Chief Complaint: Shortness of breath and wheezing  HPI: Terry Thomann. is [name] 83 y.o. male with medical history significant of COPD on 2 L of oxygen at home, end-stage renal disease on dialysis session due today that he missed, chronic [name]. fib, congestive heart failure, went outside today and when the cold air hit him it started to make him wheeze cough and became short of breath.  Ever since he went outside today he has been very short of breath and wheezing and is not been able to get it under control.  He did see his pulmonologist yesterday who started him on ciprofloxacin which she is only taken 1 dose.  Patient denies any fevers.  He denies any nausea vomiting or diarrhea.  He was also started on steroids yesterday.  Patient is referred for admission for wheezing and COPD exacerbation.  He denies any lower extremity edema or swelling or pain.  Review of Systems: As per HPI otherwise 10 point review of systems negative.   Past Medical History:  Diagnosis Date  . Acute and chronic respiratory failure 10/22/2012  . Anxiety   . Arthritis    "hands, knees" (03/15/2017)  . Atrial fibrillation ([place])   . CAD (coronary artery disease) [place], [name]  . CHF (congestive heart failure) ([place]) 09/2012  . COPD (chronic obstructive pulmonary disease) ([place])   . ESRD (end stage renal disease) on dialysis [facility])    "MWF; [facility], [place]" (03/15/2017)  . Flu 09/24/2016   influenza b  . HCAP (healthcare-associated pneumonia) 03/15/2017  . High cholesterol   . History of blood transfusion 2012   "related to bowel resection"  . Hypertension   . On home oxygen therapy    "1L typically at night" (03/15/2017)  . Osteoarthritis 10/22/2012  . Pneumonia April 2014; 09/2016  . Shortness of breath dyspnea   . Stroke [facility]) 2012   denies residual on  03/15/2017  . Stroke [facility]) ~ 2013   "small one in his right eye"    Past Surgical History:  Procedure Laterality Date  . APPENDECTOMY  1984  . AV FISTULA PLACEMENT Right 05/21/2015   Procedure: Right Arm Brachiocephalic ARTERIOVENOUS (AV) FISTULA CREATION;  Surgeon: Angelia Mould, MD;  Location: [place];  Service: Vascular;  Laterality: Right;  . CATARACT EXTRACTION W/ INTRAOCULAR LENS  IMPLANT, BILATERAL Bilateral   . COLECTOMY  2012   partial colon removed - twisted bowel  . CORONARY ANGIOPLASTY WITH STENT PLACEMENT  1999   2 stents  . CYSTOSCOPY/RETROGRADE/URETEROSCOPY Bilateral 04/23/2014   Procedure: CYSTOSCOPY BILATERAL RETROGRADE,  LEFT URETEROSCOPY, LEFT STENT PLACEMENT;  Surgeon: Raynelle Bring, MD;  Location: [facility];  Service: Urology;  Laterality: Bilateral;  . EYE SURGERY Left    "surgery to clean off lens after cataract OR"  . INGUINAL HERNIA REPAIR Right      reports that he quit smoking about 17 years ago. His smoking use included cigarettes, pipe, and cigars. He has [name] 49.00 pack-year smoking history. He has quit using smokeless tobacco. His smokeless tobacco use included chew. He reports that he drinks alcohol. He reports that he does not use drugs.  Allergies  Allergen Reactions  . Yellow Dyes (Non-Tartrazine) Other (See Comments)    Burns arms Cough medications (tussionex and benzonatate ok)  . Levaquin [Levofloxacin In D5w] Itching  Family History  Problem Relation Age of Onset  . Cancer Mother        Breast  . Heart disease Father   . Heart attack Father   . Parkinson's disease Brother     Prior to Admission medications   Medication Sig Start Date End Date Taking? Authorizing Provider  Aclidinium Bromide (TUDORZA PRESSAIR) 400 MCG/ACT AEPB Inhale 2 puffs into the lungs daily. Patient taking differently: Inhale 1 puff into the lungs 2 (two) times daily.  06/16/17  Yes Tanda Rockers, MD  albuterol (PROVENTIL) (2.5 MG/3ML) 0.083% nebulizer solution  Take 3 mLs (2.5 mg total) by nebulization every 6 (six) hours as needed for wheezing or shortness of breath. 07/16/17  Yes Parrett, Tammy S, NP  aspirin EC 325 MG tablet Take 1 tablet (325 mg total) by mouth daily. Patient taking differently: Take 162 mg by mouth daily.  09/26/15  Yes Burnell Blanks, MD  atorvastatin (LIPITOR) 20 MG tablet Take 1 tablet (20 mg total) by mouth daily. Patient taking differently: Take 20 mg by mouth every evening.  09/21/16  Yes Burnell Blanks, MD  calcium acetate (PHOSLO) 667 MG capsule Take 667-1,334 mg by mouth 3 (three) times daily with meals.  01/09/16  Yes [provider]  chlorpheniramine-HYDROcodone (TUSSIONEX PENNKINETIC ER) 10-8 MG/5ML SUER Take 5 mLs by mouth every 12 (twelve) hours as needed for cough. 06/22/17  Yes Debbe Odea, MD  cholecalciferol (VITAMIN D) 1000 units tablet Take 1,000 Units by mouth daily.   Yes [provider]  ciprofloxacin (CIPRO) 500 MG tablet Take 1 tablet (500 mg total) by mouth 2 (two) times daily for 10 days. 07/15/17 07/25/17 Yes Parrett, Tammy S, NP  methocarbamol (ROBAXIN) 500 MG tablet Take 500 mg by mouth 2 (two) times daily as needed for muscle spasms.   Yes [provider]  metoprolol succinate (TOPROL-XL) 50 MG 24 hr tablet Take 50 mg by mouth 2 (two) times daily. Take with or immediately following [name] meal.    Yes [provider]  midodrine (PROAMATINE) 10 MG tablet Take 10 mg by mouth as needed. At dialysis for low pressure   Yes [provider]  multivitamin (RENA-VIT) TABS tablet Take 1 tablet by mouth daily.   Yes [provider]  OXYGEN Inhale 1 L into the lungs at bedtime.    Yes [provider]  predniSONE (DELTASONE) 10 MG tablet Take 6 tablets (60 mg total) by mouth daily with breakfast. Take 6 tabs tomorrow and then decrease by 1 tab daily until finished Patient taking differently: Take 2 mg by mouth daily.  06/22/17  Yes Debbe Odea, MD    SYMBICORT 160-4.5 MCG/ACT inhaler INHALE 2 PUFFS TWICE DAILY 11/30/16  Yes Tanda Rockers, MD  ethyl chloride spray Apply 1 application topically See admin instructions. For dialysis 05/28/17   [provider]    Physical Exam: Vitals:   07/16/17 1530 07/16/17 1600 07/16/17 1630 07/16/17 1748  BP: (!) 156/77 (!) 145/69 (!) 148/60 (!) 160/64  Pulse: 97 (!) 125 94 94  Resp: 16 (!) 22 (!) 21 18  Temp:      TempSrc:      SpO2: 99% 97% 98% 98%  Weight:      Height:          Constitutional: NAD, calm, comfortable Vitals:   07/16/17 1530 07/16/17 1600 07/16/17 1630 07/16/17 1748  BP: (!) 156/77 (!) 145/69 (!) 148/60 (!) 160/64  Pulse: 97 (!) 125 94 94  Resp: 16 (!) 22 (!) 21 18  Temp:      TempSrc:      SpO2: 99% 97% 98% 98%  Weight:      Height:       Eyes: PERRL, lids and conjunctivae normal ENMT: Mucous membranes are moist. Posterior pharynx clear of any exudate or lesions.Normal dentition.  Neck: normal, supple, no masses, no thyromegaly Respiratory: Diminished and wheezing to auscultation bilaterall, no crackles. Normal respiratory effort. No accessory muscle use.  Cardiovascular: Regular rate and rhythm, no murmurs / rubs / gallops. No extremity edema. 2+ pedal pulses. No carotid bruits.  Abdomen: no tenderness, no masses palpated. No hepatosplenomegaly. Bowel sounds positive.  Musculoskeletal: no clubbing / cyanosis. No joint deformity upper and lower extremities. Good ROM, no contractures. Normal muscle tone.  Skin: no rashes, lesions, ulcers. No induration Neurologic: CN 2-12 grossly intact. Sensation intact, DTR normal. Strength 5/5 in all 4.  Psychiatric: Normal judgment and insight. Alert and oriented x 3. Normal mood.    Labs on Admission: I have personally reviewed following labs and imaging studies  CBC: Recent Labs  Lab 07/16/17 1253  WBC 12.2*  NEUTROABS 11.2*  HGB 9.5*  HCT 30.5*  MCV 106.6*  PLT 182   Basic Metabolic Panel: Recent Labs   Lab 07/16/17 1253  NA 140  K 5.2*  CL 98*  CO2 27  GLUCOSE 143*  BUN 72*  CREATININE 5.83*  CALCIUM 8.6*   GFR: Estimated Creatinine Clearance: 9.8 mL/min ([name]) (by C-G formula based on SCr of 5.83 mg/dL (H)). Liver Function Tests: No results for input(s): AST, ALT, ALKPHOS, BILITOT, PROT, ALBUMIN in the last 168 hours. No results for input(s): LIPASE, AMYLASE in the last 168 hours. No results for input(s): AMMONIA in the last 168 hours. Coagulation Profile: No results for input(s): INR, PROTIME in the last 168 hours. Cardiac Enzymes: No results for input(s): CKTOTAL, CKMB, CKMBINDEX, TROPONINI in the last 168 hours. BNP (last 3 results) No results for input(s): PROBNP in the last 8760 hours. HbA1C: No results for input(s): HGBA1C in the last 72 hours. CBG: No results for input(s): GLUCAP in the last 168 hours. Lipid Profile: No results for input(s): CHOL, HDL, LDLCALC, TRIG, CHOLHDL, LDLDIRECT in the last 72 hours. Thyroid Function Tests: No results for input(s): TSH, T4TOTAL, FREET4, T3FREE, THYROIDAB in the last 72 hours. Anemia Panel: No results for input(s): VITAMINB12, FOLATE, FERRITIN, TIBC, IRON, RETICCTPCT in the last 72 hours. Urine analysis:    Component Value Date/Time   COLORURINE AMBER ([name]) 08/16/2015 0655   APPEARANCEUR HAZY ([name]) 08/16/2015 0655   LABSPEC 1.023 08/16/2015 0655   PHURINE 5.0 08/16/2015 0655   GLUCOSEU 100 ([name]) 08/16/2015 0655   HGBUR NEGATIVE 08/16/2015 0655   BILIRUBINUR SMALL ([name]) 08/16/2015 0655   KETONESUR 15 ([name]) 08/16/2015 0655   PROTEINUR >300 ([name]) 08/16/2015 0655   UROBILINOGEN 0.2 04/16/2014 1135   NITRITE NEGATIVE 08/16/2015 0655   LEUKOCYTESUR SMALL ([name]) 08/16/2015 0655   Sepsis Labs: !!!!!!!!!!!!!!!!!!!!!!!!!!!!!!!!!!!!!!!!!!!! @LABRCNTIP (procalcitonin:4,lacticidven:4) )No results found for this or any previous visit (from the past 240 hour(s)).   Radiological Exams on Admission: Dg Chest 2 View  Result Date:  07/16/2017 CLINICAL DATA:  Sudden onset of shortness of breath. Patient is on dialysis and home oxygen therapy. EXAM: CHEST  2 VIEW COMPARISON:  07/15/2017 FINDINGS: Mildly enlarged heart. Calcific atherosclerotic disease of the aorta. Mediastinal contours appear intact. Mild prominence of the interstitium. Bilateral pleural effusions. Bibasilar airspace consolidation versus atelectasis. Osseous structures are without acute  abnormality. Soft tissues are grossly normal. IMPRESSION: Mildly enlarged heart with pulmonary vascular congestion. Bilateral small pleural effusions. Bibasilar airspace consolidation versus atelectasis. Electronically Signed   By: Fidela Salisbury M.D.   On: 07/16/2017 13:02   Dg Chest 2 View  Result Date: 07/15/2017 CLINICAL DATA:  Cough and shortness of breath EXAM: CHEST  2 VIEW COMPARISON:  June 20, 2017 FINDINGS: There is mild scarring in the lung bases. There is no edema or consolidation. Heart size and pulmonary vascularity are normal. There is aortic atherosclerosis. No adenopathy. There is degenerative change in the thoracic spine and in each shoulder. IMPRESSION: Bibasilar scarring. No edema or consolidation. Aortic atherosclerosis. Aortic Atherosclerosis (ICD10-I70.0). Electronically Signed   By: Lowella Grip III M.D.   On: 07/15/2017 09:59   Ct Chest Wo Contrast  Result Date: 07/16/2017 CLINICAL DATA:  Acute onset dyspnea EXAM: CT CHEST WITHOUT CONTRAST TECHNIQUE: Multidetector CT imaging of the chest was performed following the standard protocol without IV contrast. COMPARISON:  03/11/2017 chest CT FINDINGS: Cardiovascular: Normal branch pattern of the great vessels with atherosclerotic calcifications noted proximally. Moderate aortic atherosclerosis with left main and three-vessel coronary arteriosclerosis is identified. The heart is enlarged without pericardial effusion or thickening. Mediastinum/Nodes: Calcified right hilar and mediastinal lymph nodes are  present without adenopathy likely related to old granulomatous disease. Slight narrowing of the transverse dimension of the trachea in the superior mediastinum likely on the basis of mild thyromegaly. The thyroid gland extends caudad to the level of the clavicular heads. No dominant mass is seen however. CT appearance of the esophagus is unremarkable. Lungs/Pleura: Linear scarring or atelectasis in the left upper lobe. Dependent and subsegmental atelectasis is noted in both lower lobes slightly more confluent in the right lower lobe. Mild peribronchial thickening is noted bilaterally. Centrilobular emphysema, upper lobe predominant is visualized. No dominant mass, effusion or pneumothorax. Atelectasis Upper Abdomen: Subcentimeter hypodensities in the left hepatic lobe the largest approximately 8 mm are identified more likely to represent cysts or hemangiomata. Mild surface nodularity of the liver may represent morphologic changes of cirrhosis. Recannulized umbilical vein is partially included on this study. Splenic granulomas are present. Numerous bilateral renal cysts some of which appear hyperdense and complex are noted of the included upper poles of both kidneys. The adrenal glands are unremarkable. The pancreas is nonacute. Musculoskeletal: Thoracic spondylosis without acute appearing osseous abnormality. IMPRESSION: 1. Stable cardiomegaly with left main and three-vessel coronary arteriosclerosis. 2. Centrilobular emphysema with stable left apical linear scarring. 3. Bibasilar atelectasis right greater than left slightly more confluent in the right lower lobe since prior exam. 4. Subcentimeter hypodensities within the liver more commonly associated with cysts or hemangiomata. Morphologic appearance of mild cirrhosis with slightly recannulized umbilical vein. 5. Simple and complex renal cysts are redemonstrated. 6. Splenic granulomata. 7. Mediastinal and hilar granulomata. 8. Mild thyromegaly extending to level  of the clavicular heads causing slight transverse narrowing of the trachea from extrinsic mass effect. Aortic Atherosclerosis (ICD10-I70.0) and Emphysema (ICD10-J43.9). Electronically Signed   By: Ashley Royalty M.D.   On: 07/16/2017 14:04    EKG: Independently reviewed.  Normal sinus rhythm no acute changes Old chart reviewed Case discussed with EDP Chest x-ray reviewed no edema or infiltrate  Assessment/Plan 83 year old male with COPD exacerbation with chronic respiratory failure Principal Problem:   COPD with acute exacerbation (HCC)-IV Solu-Medrol, continue his Cipro orally.  He was placed on Cipro for history of Pseudomonas.  Cultures from yesterday are pending from the oncology office.  We  will not repeat at this time.  Frequent nebulizer treatments. Active Problems:   Acute on chronic respiratory failure with hypoxia (HCC)-patient with O2 sats in the low 90s on his 2 L.  Remaining stable on 2-3 L.   Essential hypertension-stable   Chronic atrial fibrillation (HCC)-stable   Chronic diastolic heart failure (HCC)-not on chronic diuresis at home   Anemia of chronic disease-stable   Memory loss of unknown cause-noted   CKD (chronic kidney disease) stage V requiring chronic dialysis (HCC)-Dr. Lowanda Foster has been called from the emergency department and is going to do dialysis in the morning as he missed his session today   PAF (paroxysmal atrial fibrillation) (HCC)-stable   End stage renal disease on dialysis (HCC)-dialysis in the morning    DVT prophylaxis: SCDs Code Status: Full Family Communication: Wife Disposition Plan: Per day team Consults called: Nephrology Admission status: Observation   Terry Genna A MD [facility]  If 7PM-7AM, please contact night-coverage [email or web address] Password [facility]  07/16/2017, 6:24 PM

## 2017-07-16 NOTE — ED Provider Notes (Signed)
[facility] EMERGENCY DEPARTMENT Provider Note   CSN: [id number] Arrival date & time: 07/16/17  1200     History   Chief Complaint Chief Complaint  Patient presents with  . Weakness    HPI Terry [name]. is a 83 y.o. male.  Patient is a dialysis patient, patient normally dialyzed Monday Wednesdays and Fridays.  He was dialyzed on Monday and Wednesday.  Today he was feeling very short of breath so his wife drove him to dialysis.  He normally drives himself.  When he got there he was extremely short of breath so they called EMS and he brought in.  Patient has a known history of COPD.  He was diagnosed with a community-acquired pneumonia on December 30 and was in the hospital for a while.  All this month he has been struggling with some shortness of breath.  But seems to have gotten worse in the last 2 days.  Patient denies any fever.  Patient denies any chest pain.  Patient seems to have a lot of phlegm in his lungs according to him.      Past Medical History:  Diagnosis Date  . Acute and chronic respiratory failure 10/22/2012  . Anxiety   . Arthritis    "hands, knees" (03/15/2017)  . Atrial fibrillation ([place])   . CAD (coronary artery disease) [place], [name]  . CHF (congestive heart failure) ([place]) 09/2012  . COPD (chronic obstructive pulmonary disease) ([place])   . ESRD (end stage renal disease) on dialysis [facility])    "MWF; [facility] [place]" (03/15/2017)  . Flu 09/24/2016   influenza b  . HCAP (healthcare-associated pneumonia) 03/15/2017  . High cholesterol   . History of blood transfusion 2012   "related to bowel resection"  . Hypertension   . On home oxygen therapy    "1L typically at night" (03/15/2017)  . Osteoarthritis 10/22/2012  . Pneumonia April 2014; 09/2016  . Shortness of breath dyspnea   . Stroke [facility]) 2012   denies residual on 03/15/2017  . Stroke [facility]) ~ 2013   "small one in his right eye"    Patient Active Problem List   Diagnosis Date  Noted  . Coronavirus infection 06/22/2017  . ESRD on dialysis ([place])   . Hypoxia   . COPD with acute exacerbation ([place]) 06/21/2017  . Sepsis ([place]) 06/21/2017  . Hypertension 03/15/2017  . Chest pain 03/15/2017  . High cholesterol 03/15/2017  . CKD (chronic kidney disease) stage V requiring chronic dialysis ([place]) 03/15/2017  . COPD (chronic obstructive pulmonary disease) GOLD stage III 03/15/2017  . Chronic hypoxemic respiratory failure ([place]) 03/15/2017  . PAF (paroxysmal atrial fibrillation) ([place]) 03/15/2017  . Acute on chronic respiratory failure with hypoxia ([place])   . Pulmonary infiltrates on CXR 02/10/2017  . Upper airway cough syndrome 08/13/2016  . Memory loss of unknown cause 04/08/2016  . Chronic respiratory failure with hypoxia ([place]) 08/27/2015  . ESRD (end stage renal disease) on dialysis ([place]) 07/28/2015  . HCAP (healthcare-associated pneumonia)   . Pneumothorax, left   . Chronic diastolic heart failure ([place])   . Anemia of chronic disease   . Pneumonia due to Enterobacter cloacae ([place])   . Pneumonia due to Klebsiella pneumoniae ([place])   . Chronic atrial fibrillation ([place])   . CAD (coronary artery disease) 02/04/2015  . Hyperkalemia 04/13/2014  . COPD GOLD III 10/22/2012  . Other and unspecified hyperlipidemia 10/22/2012  . Osteoarthritis 10/22/2012  . Essential hypertension 10/22/2012  Past Surgical History:  Procedure Laterality Date  . APPENDECTOMY  1984  . AV FISTULA PLACEMENT Right 05/21/2015   Procedure: Right Arm Brachiocephalic ARTERIOVENOUS (AV) FISTULA CREATION;  Surgeon: Angelia Mould, MD;  Location: [place] [facility];  Service: Vascular;  Laterality: Right;  . CATARACT EXTRACTION W/ INTRAOCULAR LENS  IMPLANT, BILATERAL Bilateral   . COLECTOMY  2012   partial colon removed - twisted bowel  . CORONARY ANGIOPLASTY WITH STENT PLACEMENT  1999   2 stents  . CYSTOSCOPY/RETROGRADE/URETEROSCOPY Bilateral 04/23/2014   Procedure: CYSTOSCOPY BILATERAL RETROGRADE,   LEFT URETEROSCOPY, LEFT STENT PLACEMENT;  Surgeon: Raynelle Bring, MD;  Location: [facility];  Service: Urology;  Laterality: Bilateral;  . EYE SURGERY Left    "surgery to clean off lens after cataract OR"  . INGUINAL HERNIA REPAIR Right        Home Medications    Prior to Admission medications   Medication Sig Start Date End Date Taking? Authorizing Provider  Aclidinium Bromide (TUDORZA PRESSAIR) 400 MCG/ACT AEPB Inhale 2 puffs into the lungs daily. Patient taking differently: Inhale 1 puff into the lungs 2 (two) times daily.  06/16/17   Tanda Rockers, MD  albuterol (PROVENTIL) (2.5 MG/3ML) 0.083% nebulizer solution Take 3 mLs (2.5 mg total) by nebulization every 6 (six) hours as needed for wheezing or shortness of breath. 03/18/17   Elwin Mocha, MD  aspirin EC 325 MG tablet Take 1 tablet (325 mg total) by mouth daily. 09/26/15   Burnell Blanks, MD  atorvastatin (LIPITOR) 20 MG tablet Take 1 tablet (20 mg total) by mouth daily. 09/21/16   Burnell Blanks, MD  calcium acetate (PHOSLO) 667 MG capsule Take 667-1,334 mg by mouth 3 (three) times daily with meals.  01/09/16   [provider]  chlorpheniramine-HYDROcodone (TUSSIONEX PENNKINETIC ER) 10-8 MG/5ML SUER Take 5 mLs by mouth every 12 (twelve) hours as needed for cough. 06/22/17   Debbe Odea, MD  cholecalciferol (VITAMIN D) 1000 units tablet Take 1,000 Units by mouth daily.    [provider]  ciprofloxacin (CIPRO) 500 MG tablet Take 1 tablet (500 mg total) by mouth 2 (two) times daily for 10 days. 07/15/17 07/25/17  Parrett, Fonnie Mu, NP  ethyl chloride spray Apply 1 application topically See admin instructions. For dialysis 05/28/17   [provider]  methocarbamol (ROBAXIN) 500 MG tablet Take 500 mg by mouth 2 (two) times daily as needed for muscle spasms.    [provider]  metoprolol succinate (TOPROL-XL) 50 MG 24 hr tablet Take 50 mg by mouth 2 (two) times daily. Take with or immediately  following a meal.     [provider]  midodrine (PROAMATINE) 10 MG tablet Take 10 mg by mouth as needed. At dialysis for low pressure    [provider]  multivitamin (RENA-VIT) TABS tablet Take 1 tablet by mouth daily.    [provider]  OXYGEN Inhale 1 L into the lungs at bedtime.     [provider]  predniSONE (DELTASONE) 10 MG tablet Take 6 tablets (60 mg total) by mouth daily with breakfast. Take 6 tabs tomorrow and then decrease by 1 tab daily until finished 06/22/17   Debbe Odea, MD  SYMBICORT 160-4.5 MCG/ACT inhaler INHALE 2 PUFFS TWICE DAILY 11/30/16   Tanda Rockers, MD    Family History Family History  Problem Relation Age of Onset  . Cancer Mother        Breast  . Heart disease Father   .  Heart attack Father   . Parkinson's disease Brother     Social History Social History   Tobacco Use  . Smoking status: Former Smoker    Packs/day: 1.00    Years: 49.00    Pack years: 49.00    Types: Cigarettes, Pipe, Cigars    Last attempt to quit: 02/21/2000    Years since quitting: 17.4  . Smokeless tobacco: Former Systems developer    Types: Chew  . Tobacco comment: "chewed when he was a kid"  Substance Use Topics  . Alcohol use: Yes    Comment: 03/15/2017 "nothing since the 1960's"  . Drug use: No     Allergies   Yellow dyes (non-tartrazine) and Levaquin [levofloxacin in d5w]   Review of Systems Review of Systems  Constitutional: Positive for fatigue. Negative for fever.  HENT: Negative for congestion.   Eyes: Negative for redness.  Respiratory: Positive for cough and shortness of breath.   Cardiovascular: Negative for chest pain and leg swelling.  Gastrointestinal: Negative for abdominal pain.  Genitourinary: Negative for dysuria.  Musculoskeletal: Negative for back pain.  Skin: Negative for rash.  Neurological: Negative for headaches.  Hematological: Does not bruise/bleed easily.  Psychiatric/Behavioral: Negative for confusion.      Physical Exam Updated Vital Signs BP (!) 145/69   Pulse (!) 125   Temp 98.5 F (36.9 C) (Oral)   Resp (!) 22   Ht 1.727 m (5\' 8" )   Wt 70.8 kg (156 lb)   SpO2 97%   BMI 23.72 kg/m   Physical Exam  Constitutional: He is oriented to person, place, and time. He appears well-developed and well-nourished. No distress.  HENT:  Head: Normocephalic and atraumatic.  Mouth/Throat: Oropharynx is clear and moist.  Eyes: Conjunctivae and EOM are normal. Pupils are equal, round, and reactive to light.  Neck: Neck supple.  Cardiovascular: Normal rate and regular rhythm.  Pulmonary/Chest: He is in respiratory distress. He has wheezes. He exhibits tenderness.  Abdominal: Soft. Bowel sounds are normal. There is no tenderness.  Musculoskeletal: Normal range of motion. He exhibits no edema.  Neurological: He is alert and oriented to person, place, and time. No cranial nerve deficit or sensory deficit. He exhibits normal muscle tone. Coordination normal.  Skin: Skin is warm.  Nursing note and vitals reviewed.    ED Treatments / Results  Labs (all labs ordered are listed, but only abnormal results are displayed) Labs Reviewed  CBC WITH DIFFERENTIAL/PLATELET - Abnormal; Notable for the following components:      Result Value   WBC 12.2 (*)    RBC 2.86 (*)    Hemoglobin 9.5 (*)    HCT 30.5 (*)    MCV 106.6 (*)    RDW 17.9 (*)    Neutro Abs 11.2 (*)    Lymphs Abs 0.6 (*)    All other components within normal limits  BASIC METABOLIC PANEL - Abnormal; Notable for the following components:   Potassium 5.2 (*)    Chloride 98 (*)    Glucose, Bld 143 (*)    BUN 72 (*)    Creatinine, Ser 5.83 (*)    Calcium 8.6 (*)    GFR calc non Af Amer 8 (*)    GFR calc Af Amer 9 (*)    All other components within normal limits    EKG  EKG Interpretation  Date/Time:  Friday July 16 2017 12:28:02 EST Ventricular Rate:  80 PR Interval:    QRS Duration: 85 QT Interval:  407  QTC  Calculation: 470 R Axis:   68 Text Interpretation:  Sinus rhythm Borderline repolarization abnormality Baseline wander Interpretation limited secondary to artifact Confirmed by Fredia Sorrow [phone]) on 07/16/2017 12:33:50 PM       Radiology Dg Chest 2 View  Result Date: 07/16/2017 CLINICAL DATA:  Sudden onset of shortness of breath. Patient is on dialysis and home oxygen therapy. EXAM: CHEST  2 VIEW COMPARISON:  07/15/2017 FINDINGS: Mildly enlarged heart. Calcific atherosclerotic disease of the aorta. Mediastinal contours appear intact. Mild prominence of the interstitium. Bilateral pleural effusions. Bibasilar airspace consolidation versus atelectasis. Osseous structures are without acute abnormality. Soft tissues are grossly normal. IMPRESSION: Mildly enlarged heart with pulmonary vascular congestion. Bilateral small pleural effusions. Bibasilar airspace consolidation versus atelectasis. Electronically Signed   By: Fidela Salisbury M.D.   On: 07/16/2017 13:02   Dg Chest 2 View  Result Date: 07/15/2017 CLINICAL DATA:  Cough and shortness of breath EXAM: CHEST  2 VIEW COMPARISON:  June 20, 2017 FINDINGS: There is mild scarring in the lung bases. There is no edema or consolidation. Heart size and pulmonary vascularity are normal. There is aortic atherosclerosis. No adenopathy. There is degenerative change in the thoracic spine and in each shoulder. IMPRESSION: Bibasilar scarring. No edema or consolidation. Aortic atherosclerosis. Aortic Atherosclerosis (ICD10-I70.0). Electronically Signed   By: Lowella Grip III M.D.   On: 07/15/2017 09:59   Ct Chest Wo Contrast  Result Date: 07/16/2017 CLINICAL DATA:  Acute onset dyspnea EXAM: CT CHEST WITHOUT CONTRAST TECHNIQUE: Multidetector CT imaging of the chest was performed following the standard protocol without IV contrast. COMPARISON:  03/11/2017 chest CT FINDINGS: Cardiovascular: Normal branch pattern of the great vessels with  atherosclerotic calcifications noted proximally. Moderate aortic atherosclerosis with left main and three-vessel coronary arteriosclerosis is identified. The heart is enlarged without pericardial effusion or thickening. Mediastinum/Nodes: Calcified right hilar and mediastinal lymph nodes are present without adenopathy likely related to old granulomatous disease. Slight narrowing of the transverse dimension of the trachea in the superior mediastinum likely on the basis of mild thyromegaly. The thyroid gland extends caudad to the level of the clavicular heads. No dominant mass is seen however. CT appearance of the esophagus is unremarkable. Lungs/Pleura: Linear scarring or atelectasis in the left upper lobe. Dependent and subsegmental atelectasis is noted in both lower lobes slightly more confluent in the right lower lobe. Mild peribronchial thickening is noted bilaterally. Centrilobular emphysema, upper lobe predominant is visualized. No dominant mass, effusion or pneumothorax. Atelectasis Upper Abdomen: Subcentimeter hypodensities in the left hepatic lobe the largest approximately 8 mm are identified more likely to represent cysts or hemangiomata. Mild surface nodularity of the liver may represent morphologic changes of cirrhosis. Recannulized umbilical vein is partially included on this study. Splenic granulomas are present. Numerous bilateral renal cysts some of which appear hyperdense and complex are noted of the included upper poles of both kidneys. The adrenal glands are unremarkable. The pancreas is nonacute. Musculoskeletal: Thoracic spondylosis without acute appearing osseous abnormality. IMPRESSION: 1. Stable cardiomegaly with left main and three-vessel coronary arteriosclerosis. 2. Centrilobular emphysema with stable left apical linear scarring. 3. Bibasilar atelectasis right greater than left slightly more confluent in the right lower lobe since prior exam. 4. Subcentimeter hypodensities within the liver  more commonly associated with cysts or hemangiomata. Morphologic appearance of mild cirrhosis with slightly recannulized umbilical vein. 5. Simple and complex renal cysts are redemonstrated. 6. Splenic granulomata. 7. Mediastinal and hilar granulomata. 8. Mild thyromegaly extending to level of the clavicular heads  causing slight transverse narrowing of the trachea from extrinsic mass effect. Aortic Atherosclerosis (ICD10-I70.0) and Emphysema (ICD10-J43.9). Electronically Signed   By: Ashley Royalty M.D.   On: 07/16/2017 14:04    Procedures Procedures (including critical care time)  Medications Ordered in ED Medications  methylPREDNISolone sodium succinate (SOLU-MEDROL) 125 mg/2 mL injection 125 mg (not administered)  ipratropium-albuterol (DUONEB) 0.5-2.5 (3) MG/3ML nebulizer solution 3 mL (3 mLs Nebulization Given 07/16/17 1441)     Initial Impression / Assessment and Plan / ED Course  I have reviewed the triage vital signs and the nursing notes.  Pertinent labs & imaging results that were available during my care of the patient were reviewed by me and considered in my medical decision making (see chart for details).    Patient presented with some respiratory difficulties.  On 4 L patient satting in the 90s patient had some bouts where he desatted down to 87%.  Even without exertion.  And had increased shortness of breath.  Chest x-ray raise some concern for possible pneumonia.  So a CT chest was done which was negative for pneumonia.  Patient given albuterol Atrovent nebulizer no real change.  Patient started on Solu-Medrol.  No evidence of any significant pulmonary edema.  Potassium elevated but less than 6.  Discussed with nephrology Dr. Oley Balm.  Patient can be dialyzed tomorrow.  He felt he did not need to be dialyzed today.  Patient's hemoglobin is 9.5.  So there is a component of anemia.  But blood transfusion not required.  Think the symptoms with the shortness of breath or an  exacerbation of COPD and bronchitis.  Patient will required continue help with oxygen.  He does have oxygen at home but is not normally on it at all times.  Hospitalist will admit.  Probably will require dialysis tomorrow.   Final Clinical Impressions(s) / ED Diagnoses   Final diagnoses:  Chronic obstructive pulmonary disease with acute exacerbation (HCC)  Anemia, unspecified type  End stage renal disease on dialysis [facility])    ED Discharge Orders    None       Fredia Sorrow, MD 07/16/17 1742

## 2017-07-16 NOTE — ED Notes (Signed)
Pt declines breathing treatment for lunch currently

## 2017-07-16 NOTE — ED Triage Notes (Signed)
Pt was enroute to dialysis and was almost to his vehicle when he had a sudden onset of dyspnea. He felt he could not breathe and upon asrrival of EMS, pt had blue lips   He is on home O2, was put on a non rebreather and weaned to 2L  upon arrival to this department.   He has not had his dialysis and asks if we can do that here

## 2017-07-16 NOTE — ED Notes (Signed)
Has completed meal  Respiratory called  Awaiting reeval and dispo

## 2017-07-17 DIAGNOSIS — J441 Chronic obstructive pulmonary disease with (acute) exacerbation: Secondary | ICD-10-CM | POA: Diagnosis not present

## 2017-07-17 DIAGNOSIS — I959 Hypotension, unspecified: Secondary | ICD-10-CM | POA: Diagnosis not present

## 2017-07-17 DIAGNOSIS — D631 Anemia in chronic kidney disease: Secondary | ICD-10-CM | POA: Diagnosis not present

## 2017-07-17 DIAGNOSIS — Z992 Dependence on renal dialysis: Secondary | ICD-10-CM | POA: Diagnosis not present

## 2017-07-17 DIAGNOSIS — N186 End stage renal disease: Secondary | ICD-10-CM | POA: Diagnosis not present

## 2017-07-17 DIAGNOSIS — J9621 Acute and chronic respiratory failure with hypoxia: Secondary | ICD-10-CM | POA: Diagnosis not present

## 2017-07-17 LAB — CBC
HCT: 29.7 % — ABNORMAL LOW (ref 39.0–52.0)
Hemoglobin: 9.2 g/dL — ABNORMAL LOW (ref 13.0–17.0)
MCH: 32.9 pg (ref 26.0–34.0)
MCHC: 31 g/dL (ref 30.0–36.0)
MCV: 106.1 fL — AB (ref 78.0–100.0)
Platelets: 165 10*3/uL (ref 150–400)
RBC: 2.8 MIL/uL — AB (ref 4.22–5.81)
RDW: 17.9 % — AB (ref 11.5–15.5)
WBC: 9.7 10*3/uL (ref 4.0–10.5)

## 2017-07-17 LAB — BASIC METABOLIC PANEL
Anion gap: 14 (ref 5–15)
BUN: 89 mg/dL — AB (ref 6–20)
CALCIUM: 8.3 mg/dL — AB (ref 8.9–10.3)
CHLORIDE: 98 mmol/L — AB (ref 101–111)
CO2: 25 mmol/L (ref 22–32)
CREATININE: 7.02 mg/dL — AB (ref 0.61–1.24)
GFR calc non Af Amer: 7 mL/min — ABNORMAL LOW (ref >60)
GFR, EST AFRICAN AMERICAN: 8 mL/min — AB (ref >60)
Glucose, Bld: 142 mg/dL — ABNORMAL HIGH (ref 65–99)
Potassium: 5.2 mmol/L — ABNORMAL HIGH (ref 3.5–5.1)
SODIUM: 137 mmol/L (ref 135–145)

## 2017-07-17 MED ORDER — MIDODRINE HCL 5 MG PO TABS: 10.0000 mg | ORAL_TABLET | ORAL | Status: DC

## 2017-07-17 MED ORDER — PENTAFLUOROPROP-TETRAFLUOROETH EX AERO: 1.0000 "application " | INHALATION_SPRAY | CUTANEOUS | Status: DC | PRN

## 2017-07-17 MED ORDER — LIDOCAINE HCL (PF) 1 % IJ SOLN: 5.0000 mL | INTRAMUSCULAR | Status: DC | PRN

## 2017-07-17 MED ORDER — SODIUM CHLORIDE 0.9 % IV SOLN: 100.0000 mL | INTRAVENOUS | Status: DC | PRN

## 2017-07-17 MED ORDER — LIDOCAINE-PRILOCAINE 2.5-2.5 % EX CREA: 1.0000 "application " | TOPICAL_CREAM | CUTANEOUS | Status: DC | PRN

## 2017-07-17 NOTE — Consult Note (Signed)
Reason for Consult:ESRD Referring Physician: Dr. Betsey Amen. is an 83 y.o. male.  HPI:  He is a patient who has history of CVA, COPD, atrial fibrillation, end-stage renal disease on maintenance hemodialysis Monday Wednesday Friday presently came with complaints of difficulty breathing for the last 2 days.  According the patient his problem started about a month ago when he was admitted for community-acquired pneumonia.  Since then he has some difficulty breathing but the last 2 days started getting worse.  Patient is states that when he goes out to go to dialysis he felt cold and start wheezing with worsening of his breathing.  Hence he decided to come to the emergency room.  Patient was found to have exacerbation of his COPD/bronchitis.  This morning he said he is feeling much better than yesterday.  He does not cough that much.  He has still some difficulty breathing but not as bad as yesterday.  Patient was due for dialysis yesterday as his regular schedule is Monday, Wednesday and Friday.     Past Medical History:  Diagnosis Date  . Acute and chronic respiratory failure 10/22/2012  . Anxiety   . Arthritis    "hands, knees" (03/15/2017)  . Atrial fibrillation ([place])   . CAD (coronary artery disease) [place], [name]  . CHF (congestive heart failure) ([place]) 09/2012  . COPD (chronic obstructive pulmonary disease) ([place])   . ESRD (end stage renal disease) on dialysis [facility])    "MWF; [facility], [place]" (03/15/2017)  . Flu 09/24/2016   influenza b  . HCAP (healthcare-associated pneumonia) 03/15/2017  . High cholesterol   . History of blood transfusion 2012   "related to bowel resection"  . Hypertension   . On home oxygen therapy    "1L typically at night" (03/15/2017)  . Osteoarthritis 10/22/2012  . Pneumonia April 2014; 09/2016  . Shortness of breath dyspnea   . Stroke [facility]) 2012   denies residual on 03/15/2017  . Stroke [facility]) ~ 2013   "small one in his right  eye"    Past Surgical History:  Procedure Laterality Date  . APPENDECTOMY  1984  . AV FISTULA PLACEMENT Right 05/21/2015   Procedure: Right Arm Brachiocephalic ARTERIOVENOUS (AV) FISTULA CREATION;  Surgeon: Angelia Mould, MD;  Location: [place];  Service: Vascular;  Laterality: Right;  . CATARACT EXTRACTION W/ INTRAOCULAR LENS  IMPLANT, BILATERAL Bilateral   . COLECTOMY  2012   partial colon removed - twisted bowel  . CORONARY ANGIOPLASTY WITH STENT PLACEMENT  1999   2 stents  . CYSTOSCOPY/RETROGRADE/URETEROSCOPY Bilateral 04/23/2014   Procedure: CYSTOSCOPY BILATERAL RETROGRADE,  LEFT URETEROSCOPY, LEFT STENT PLACEMENT;  Surgeon: Raynelle Bring, MD;  Location: [facility];  Service: Urology;  Laterality: Bilateral;  . EYE SURGERY Left    "surgery to clean off lens after cataract OR"  . INGUINAL HERNIA REPAIR Right     Family History  Problem Relation Age of Onset  . Cancer Mother        Breast  . Heart disease Father   . Heart attack Father   . Parkinson's disease Brother     Social History:  reports that he quit smoking about 17 years ago. His smoking use included cigarettes, pipe, and cigars. He has a 49.00 pack-year smoking history. He has quit using smokeless tobacco. His smokeless tobacco use included chew. He reports that he drinks alcohol. He reports that he does not use drugs.  Allergies:  Allergies  Allergen Reactions  .  Yellow Dyes (Non-Tartrazine) Other (See Comments)    Burns arms Cough medications (tussionex and benzonatate ok)  . Levaquin [Levofloxacin In D5w] Itching    Medications: I have reviewed the patient's current medications.  Results for orders placed or performed during the hospital encounter of 07/16/17 (from the past 48 hour(s))  CBC with Differential/Platelet     Status: Abnormal   Collection Time: 07/16/17 12:53 PM  Result Value Ref Range   WBC 12.2 (H) 4.0 - 10.5 K/uL   RBC 2.86 (L) 4.22 - 5.81 MIL/uL   Hemoglobin 9.5 (L) 13.0 - 17.0 g/dL    HCT 30.5 (L) 39.0 - 52.0 %   MCV 106.6 (H) 78.0 - 100.0 fL   MCH 33.2 26.0 - 34.0 pg   MCHC 31.1 30.0 - 36.0 g/dL   RDW 17.9 (H) 11.5 - 15.5 %   Platelets 157 150 - 400 K/uL   Neutrophils Relative % 92 %   Neutro Abs 11.2 (H) 1.7 - 7.7 K/uL   Lymphocytes Relative 5 %   Lymphs Abs 0.6 (L) 0.7 - 4.0 K/uL   Monocytes Relative 3 %   Monocytes Absolute 0.4 0.1 - 1.0 K/uL   Eosinophils Relative 0 %   Eosinophils Absolute 0.0 0.0 - 0.7 K/uL   Basophils Relative 0 %   Basophils Absolute 0.0 0.0 - 0.1 K/uL  Basic metabolic panel     Status: Abnormal   Collection Time: 07/16/17 12:53 PM  Result Value Ref Range   Sodium 140 135 - 145 mmol/L   Potassium 5.2 (H) 3.5 - 5.1 mmol/L   Chloride 98 (L) 101 - 111 mmol/L   CO2 27 22 - 32 mmol/L   Glucose, Bld 143 (H) 65 - 99 mg/dL   BUN 72 (H) 6 - 20 mg/dL   Creatinine, Ser 5.83 (H) 0.61 - 1.24 mg/dL   Calcium 8.6 (L) 8.9 - 10.3 mg/dL   GFR calc non Af Amer 8 (L) >60 mL/min   GFR calc Af Amer 9 (L) >60 mL/min    Comment: (NOTE) The eGFR has been calculated using the CKD EPI equation. This calculation has not been validated in all clinical situations. eGFR's persistently <60 mL/min signify possible Chronic Kidney Disease.    Anion gap 15 5 - 15  Basic metabolic panel     Status: Abnormal   Collection Time: 07/17/17  5:39 AM  Result Value Ref Range   Sodium 137 135 - 145 mmol/L   Potassium 5.2 (H) 3.5 - 5.1 mmol/L   Chloride 98 (L) 101 - 111 mmol/L   CO2 25 22 - 32 mmol/L   Glucose, Bld 142 (H) 65 - 99 mg/dL   BUN 89 (H) 6 - 20 mg/dL   Creatinine, Ser 7.02 (H) 0.61 - 1.24 mg/dL   Calcium 8.3 (L) 8.9 - 10.3 mg/dL   GFR calc non Af Amer 7 (L) >60 mL/min   GFR calc Af Amer 8 (L) >60 mL/min    Comment: (NOTE) The eGFR has been calculated using the CKD EPI equation. This calculation has not been validated in all clinical situations. eGFR's persistently <60 mL/min signify possible Chronic Kidney Disease.    Anion gap 14 5 - 15  CBC      Status: Abnormal   Collection Time: 07/17/17  5:39 AM  Result Value Ref Range   WBC 9.7 4.0 - 10.5 K/uL   RBC 2.80 (L) 4.22 - 5.81 MIL/uL   Hemoglobin 9.2 (L) 13.0 - 17.0 g/dL   HCT  29.7 (L) 39.0 - 52.0 %   MCV 106.1 (H) 78.0 - 100.0 fL   MCH 32.9 26.0 - 34.0 pg   MCHC 31.0 30.0 - 36.0 g/dL   RDW 17.9 (H) 11.5 - 15.5 %   Platelets 165 150 - 400 K/uL    Dg Chest 2 View  Result Date: 07/16/2017 CLINICAL DATA:  Sudden onset of shortness of breath. Patient is on dialysis and home oxygen therapy. EXAM: CHEST  2 VIEW COMPARISON:  07/15/2017 FINDINGS: Mildly enlarged heart. Calcific atherosclerotic disease of the aorta. Mediastinal contours appear intact. Mild prominence of the interstitium. Bilateral pleural effusions. Bibasilar airspace consolidation versus atelectasis. Osseous structures are without acute abnormality. Soft tissues are grossly normal. IMPRESSION: Mildly enlarged heart with pulmonary vascular congestion. Bilateral small pleural effusions. Bibasilar airspace consolidation versus atelectasis. Electronically Signed   By: Fidela Salisbury M.D.   On: 07/16/2017 13:02   Dg Chest 2 View  Result Date: 07/15/2017 CLINICAL DATA:  Cough and shortness of breath EXAM: CHEST  2 VIEW COMPARISON:  June 20, 2017 FINDINGS: There is mild scarring in the lung bases. There is no edema or consolidation. Heart size and pulmonary vascularity are normal. There is aortic atherosclerosis. No adenopathy. There is degenerative change in the thoracic spine and in each shoulder. IMPRESSION: Bibasilar scarring. No edema or consolidation. Aortic atherosclerosis. Aortic Atherosclerosis (ICD10-I70.0). Electronically Signed   By: Lowella Grip III M.D.   On: 07/15/2017 09:59   Ct Chest Wo Contrast  Result Date: 07/16/2017 CLINICAL DATA:  Acute onset dyspnea EXAM: CT CHEST WITHOUT CONTRAST TECHNIQUE: Multidetector CT imaging of the chest was performed following the standard protocol without IV contrast.  COMPARISON:  03/11/2017 chest CT FINDINGS: Cardiovascular: Normal branch pattern of the great vessels with atherosclerotic calcifications noted proximally. Moderate aortic atherosclerosis with left main and three-vessel coronary arteriosclerosis is identified. The heart is enlarged without pericardial effusion or thickening. Mediastinum/Nodes: Calcified right hilar and mediastinal lymph nodes are present without adenopathy likely related to old granulomatous disease. Slight narrowing of the transverse dimension of the trachea in the superior mediastinum likely on the basis of mild thyromegaly. The thyroid gland extends caudad to the level of the clavicular heads. No dominant mass is seen however. CT appearance of the esophagus is unremarkable. Lungs/Pleura: Linear scarring or atelectasis in the left upper lobe. Dependent and subsegmental atelectasis is noted in both lower lobes slightly more confluent in the right lower lobe. Mild peribronchial thickening is noted bilaterally. Centrilobular emphysema, upper lobe predominant is visualized. No dominant mass, effusion or pneumothorax. Atelectasis Upper Abdomen: Subcentimeter hypodensities in the left hepatic lobe the largest approximately 8 mm are identified more likely to represent cysts or hemangiomata. Mild surface nodularity of the liver may represent morphologic changes of cirrhosis. Recannulized umbilical vein is partially included on this study. Splenic granulomas are present. Numerous bilateral renal cysts some of which appear hyperdense and complex are noted of the included upper poles of both kidneys. The adrenal glands are unremarkable. The pancreas is nonacute. Musculoskeletal: Thoracic spondylosis without acute appearing osseous abnormality. IMPRESSION: 1. Stable cardiomegaly with left main and three-vessel coronary arteriosclerosis. 2. Centrilobular emphysema with stable left apical linear scarring. 3. Bibasilar atelectasis right greater than left  slightly more confluent in the right lower lobe since prior exam. 4. Subcentimeter hypodensities within the liver more commonly associated with cysts or hemangiomata. Morphologic appearance of mild cirrhosis with slightly recannulized umbilical vein. 5. Simple and complex renal cysts are redemonstrated. 6. Splenic granulomata. 7. Mediastinal and hilar  granulomata. 8. Mild thyromegaly extending to level of the clavicular heads causing slight transverse narrowing of the trachea from extrinsic mass effect. Aortic Atherosclerosis (ICD10-I70.0) and Emphysema (ICD10-J43.9). Electronically Signed   By: Ashley Royalty M.D.   On: 07/16/2017 14:04    Review of Systems  Constitutional: Negative for chills and fever.  Respiratory: Positive for shortness of breath and wheezing. Negative for cough.   Cardiovascular: Negative for orthopnea.   Blood pressure (!) 152/49, pulse 83, temperature 97.7 F (36.5 C), temperature source Oral, resp. rate (!) 24, height _0  (1.727 m), weight 71.5 kg (157 lb 9.6 oz), SpO2 97 %. Physical Exam  Constitutional: He is oriented to person, place, and time. No distress.  Neck: No JVD present.  Cardiovascular:  No murmur heard. irigular rate and ythm  Respiratory: He has wheezes. He has no rales.  GI: He exhibits no distension. There is no tenderness.  Musculoskeletal: He exhibits no edema.  Neurological: He is alert and oriented to person, place, and time.    Assessment/Plan: 1] difficulty breathing: As stated above most likely from exacerbation of his COPD.  Presently he is feeling better.  Patient is on oxygen at home on a regular basis. 2] end-stage renal disease: Status post hemodialysis on Wednesday.  Patient at this moment does not have any uremic signs and symptoms and his potassium is normal. 3] history of atrial fibrillation: His heart rate is controlled 4] history of CVA 5] anemia: His hemoglobin is below our target goal 6] history of intradialytic hypotension:  Patient is on midodrine. 7] bone and mineral disorder: Calcium is at range. Plan: 1]We will make arrangements for patient to get dialysis for 4 hours today 2] we will try to remove about 30/0 liters if systolic blood pressure remains above 90 3] we will continue with midodrine 4] we will check his CBC and renal panel in the morning.    Emarion Toral S 07/17/2017, 9:40 AM

## 2017-07-17 NOTE — Procedures (Signed)
    HEMODIALYSIS TREATMENT NOTE:   4 hour heparin-free dialysis completed via right upper arm AVF (15g/antegrade). Goal met: 2.5 liters removed without interruption in ultrafiltration.  All blood was returned and hemostasis was achieved within 15 minutes.  After HD pt asked, "What could be causing this blurred vision in my left eye?"  He reports he frequently experiences blurred vision after dialysis and that it usually resolves gradually.  He states the blurriness is more pronounced today, "It's like a film over my [left] eye."  He is being followed by Dr. Deloria Lair, a [facility] rentinal specialist.  Spouse reports pt "recently had surgery...he has a lens" in the left eye.  Pt's concerns were communicated to Dr. Jerilee Hoh and primary nurse, Hermine Messick, RN.  Rockwell Alexandria, RN, CDN

## 2017-07-17 NOTE — Progress Notes (Signed)
PROGRESS NOTE    Terry [name].  XVQ:[id number] DOB: 1935/04/04 DOA: 07/16/2017 PCP: [name], DO     Brief Narrative:  83 year old man admitted from home on 1/25 due to shortness of breath.  Has a history significant for COPD on chronic oxygen, end-stage renal disease on dialysis.,  Chronic A. fib.  Admission requested   Assessment & Plan:   Principal Problem:   COPD with acute exacerbation ([place]) Active Problems:   Essential hypertension   Chronic atrial fibrillation ([facility])   Chronic diastolic heart failure (H[facility])   Anemia of chronic disease   Memory loss of unknown cause   CKD (chronic kidney disease) stage V requiring chronic dialysis ([facility])   PAF (paroxysmal atrial fibrillation) ([facility])   Acute on chronic respiratory failure with hypoxia ([facility])   End stage renal disease on dialysis ([place])   COPD with acute exacerbation/acute on chronic hypoxemic respiratory failure -Patient is chronically on 2-3 L of oxygen. -Clinically improved, only minimal wheezing. -Continue steroids, breathing treatments. -Continue empiric antibiotic therapy with Cipro.  End-stage renal disease -Status post dialysis today.   -Appreciate nephrology input and recommendations.  Left vision blurred -Patient has history of prior cataract surgery in that eye, follows with Dr. Zadie Rhine with ophthalmology. -Have applied some lubricant eyedrops and he states that this has improved. -Have advised wife to secure outpatient follow-up with ophthalmology after discharge.  Paroxysmal A. fib -Stable, rate controlled.   DVT prophylaxis: SCDs Code Status: Full code Family Communication: Wife at bedside updated on plan of care and questions answered Disposition Plan: Anticipate discharge home in 24-48 hours  Consultants:   Nephrology  Procedures:   Hemodialysis  Antimicrobials:  Anti-infectives (From admission, onward)   Start     Dose/Rate Route Frequency Ordered Stop   07/17/17 1200   ciprofloxacin (CIPRO) tablet 500 mg     500 mg Oral Daily 07/16/17 1823         Subjective: Has no complaints, states his breathing has improved, denies chest pain.  Has some mild left eye blurred vision following dialysis which he states happens routinely after dialysis but this time has lasted longer.  Objective: Vitals:   07/17/17 1500 07/17/17 1530 07/17/17 1600 07/17/17 1615  BP: (!) 151/75 131/78 (!) 145/66 130/76  Pulse: 100 (!) 107 88 (!) 101  Resp:    16  Temp:      TempSrc:      SpO2:      Weight:      Height:        Intake/Output Summary (Last 24 hours) at 07/17/2017 1840 Last data filed at 07/17/2017 1605 Gross per 24 hour  Intake 240 ml  Output 2700 ml  Net -2460 ml   Filed Weights   07/16/17 2006 07/17/17 0415 07/17/17 1155  Weight: 70.7 kg (155 lb 14.4 oz) 71.5 kg (157 lb 9.6 oz) 71.5 kg (157 lb 10.1 oz)    Examination:  General exam: Alert, awake, oriented x 3 Respiratory system: Clear to auscultation. Respiratory effort normal. Cardiovascular system:RRR. No murmurs, rubs, gallops. Gastrointestinal system: Abdomen is nondistended, soft and nontender. No organomegaly or masses felt. Normal bowel sounds heard. Central nervous system: Alert and oriented. No focal neurological deficits. Extremities: No C/C/E, +pedal pulses Skin: No rashes, lesions or ulcers Psychiatry: Judgement and insight appear normal. Mood & affect appropriate.     Data Reviewed: I have personally reviewed following labs and imaging studies  CBC: Recent Labs  Lab 07/16/17 1253 07/17/17 0539  WBC  12.2* 9.7  NEUTROABS 11.2*  --   HGB 9.5* 9.2*  HCT 30.5* 29.7*  MCV 106.6* 106.1*  PLT 157 073   Basic Metabolic Panel: Recent Labs  Lab 07/16/17 1253 07/17/17 0539  NA 140 137  K 5.2* 5.2*  CL 98* 98*  CO2 27 25  GLUCOSE 143* 142*  BUN 72* 89*  CREATININE 5.83* 7.02*  CALCIUM 8.6* 8.3*   GFR: Estimated Creatinine Clearance: 8.1 mL/min (A) (by C-G formula based on SCr  of 7.02 mg/dL (H)). Liver Function Tests: No results for input(s): AST, ALT, ALKPHOS, BILITOT, PROT, ALBUMIN in the last 168 hours. No results for input(s): LIPASE, AMYLASE in the last 168 hours. No results for input(s): AMMONIA in the last 168 hours. Coagulation Profile: No results for input(s): INR, PROTIME in the last 168 hours. Cardiac Enzymes: No results for input(s): CKTOTAL, CKMB, CKMBINDEX, TROPONINI in the last 168 hours. BNP (last 3 results) No results for input(s): PROBNP in the last 8760 hours. HbA1C: No results for input(s): HGBA1C in the last 72 hours. CBG: No results for input(s): GLUCAP in the last 168 hours. Lipid Profile: No results for input(s): CHOL, HDL, LDLCALC, TRIG, CHOLHDL, LDLDIRECT in the last 72 hours. Thyroid Function Tests: No results for input(s): TSH, T4TOTAL, FREET4, T3FREE, THYROIDAB in the last 72 hours. Anemia Panel: No results for input(s): VITAMINB12, FOLATE, FERRITIN, TIBC, IRON, RETICCTPCT in the last 72 hours. Urine analysis:    Component Value Date/Time   COLORURINE AMBER (A) 08/16/2015 0655   APPEARANCEUR HAZY (A) 08/16/2015 0655   LABSPEC 1.023 08/16/2015 0655   PHURINE 5.0 08/16/2015 0655   GLUCOSEU 100 (A) 08/16/2015 0655   HGBUR NEGATIVE 08/16/2015 0655   BILIRUBINUR SMALL (A) 08/16/2015 0655   KETONESUR 15 (A) 08/16/2015 0655   PROTEINUR >300 (A) 08/16/2015 0655   UROBILINOGEN 0.2 04/16/2014 1135   NITRITE NEGATIVE 08/16/2015 0655   LEUKOCYTESUR SMALL (A) 08/16/2015 0655   Sepsis Labs: @LABRCNTIP (procalcitonin:4,lacticidven:4)  )No results found for this or any previous visit (from the past 240 hour(s)).       Radiology Studies: Dg Chest 2 View  Result Date: 07/16/2017 CLINICAL DATA:  Sudden onset of shortness of breath. Patient is on dialysis and home oxygen therapy. EXAM: CHEST  2 VIEW COMPARISON:  07/15/2017 FINDINGS: Mildly enlarged heart. Calcific atherosclerotic disease of the aorta. Mediastinal contours appear  intact. Mild prominence of the interstitium. Bilateral pleural effusions. Bibasilar airspace consolidation versus atelectasis. Osseous structures are without acute abnormality. Soft tissues are grossly normal. IMPRESSION: Mildly enlarged heart with pulmonary vascular congestion. Bilateral small pleural effusions. Bibasilar airspace consolidation versus atelectasis. Electronically Signed   By: Fidela Salisbury M.D.   On: 07/16/2017 13:02   Ct Chest Wo Contrast  Result Date: 07/16/2017 CLINICAL DATA:  Acute onset dyspnea EXAM: CT CHEST WITHOUT CONTRAST TECHNIQUE: Multidetector CT imaging of the chest was performed following the standard protocol without IV contrast. COMPARISON:  03/11/2017 chest CT FINDINGS: Cardiovascular: Normal branch pattern of the great vessels with atherosclerotic calcifications noted proximally. Moderate aortic atherosclerosis with left main and three-vessel coronary arteriosclerosis is identified. The heart is enlarged without pericardial effusion or thickening. Mediastinum/Nodes: Calcified right hilar and mediastinal lymph nodes are present without adenopathy likely related to old granulomatous disease. Slight narrowing of the transverse dimension of the trachea in the superior mediastinum likely on the basis of mild thyromegaly. The thyroid gland extends caudad to the level of the clavicular heads. No dominant mass is seen however. CT appearance of the esophagus is  unremarkable. Lungs/Pleura: Linear scarring or atelectasis in the left upper lobe. Dependent and subsegmental atelectasis is noted in both lower lobes slightly more confluent in the right lower lobe. Mild peribronchial thickening is noted bilaterally. Centrilobular emphysema, upper lobe predominant is visualized. No dominant mass, effusion or pneumothorax. Atelectasis Upper Abdomen: Subcentimeter hypodensities in the left hepatic lobe the largest approximately 8 mm are identified more likely to represent cysts or  hemangiomata. Mild surface nodularity of the liver may represent morphologic changes of cirrhosis. Recannulized umbilical vein is partially included on this study. Splenic granulomas are present. Numerous bilateral renal cysts some of which appear hyperdense and complex are noted of the included upper poles of both kidneys. The adrenal glands are unremarkable. The pancreas is nonacute. Musculoskeletal: Thoracic spondylosis without acute appearing osseous abnormality. IMPRESSION: 1. Stable cardiomegaly with left main and three-vessel coronary arteriosclerosis. 2. Centrilobular emphysema with stable left apical linear scarring. 3. Bibasilar atelectasis right greater than left slightly more confluent in the right lower lobe since prior exam. 4. Subcentimeter hypodensities within the liver more commonly associated with cysts or hemangiomata. Morphologic appearance of mild cirrhosis with slightly recannulized umbilical vein. 5. Simple and complex renal cysts are redemonstrated. 6. Splenic granulomata. 7. Mediastinal and hilar granulomata. 8. Mild thyromegaly extending to level of the clavicular heads causing slight transverse narrowing of the trachea from extrinsic mass effect. Aortic Atherosclerosis (ICD10-I70.0) and Emphysema (ICD10-J43.9). Electronically Signed   By: Ashley Royalty M.D.   On: 07/16/2017 14:04        Scheduled Meds: . aspirin EC  325 mg Oral Daily  . atorvastatin  20 mg Oral q1800  . calcium acetate  1,334 mg Oral TID WC  . cholecalciferol  1,000 Units Oral Daily  . ciprofloxacin  500 mg Oral Q1200  . ipratropium-albuterol  3 mL Nebulization Q6H  . methylPREDNISolone (SOLU-MEDROL) injection  80 mg Intravenous Q12H  . metoprolol succinate  50 mg Oral BID  . [START ON 07/19/2017] midodrine  10 mg Oral Once per day on Mon Wed Fri  . multivitamin  1 tablet Oral Daily  . sodium chloride flush  3 mL Intravenous Q12H   Continuous Infusions: . sodium chloride    . sodium chloride    . sodium  chloride       LOS: 0 days    Time spent: 25 minutes. Greater than 50% of this time was spent in direct contact with the patient coordinating care.     Lelon Frohlich, MD [facility] Pager [phone]  If 7PM-7AM, please contact night-coverage [email or web address] Password [facility] 07/17/2017, 6:40 PM

## 2017-07-18 DIAGNOSIS — J9621 Acute and chronic respiratory failure with hypoxia: Secondary | ICD-10-CM

## 2017-07-18 DIAGNOSIS — Z992 Dependence on renal dialysis: Secondary | ICD-10-CM | POA: Diagnosis not present

## 2017-07-18 DIAGNOSIS — D631 Anemia in chronic kidney disease: Secondary | ICD-10-CM | POA: Diagnosis not present

## 2017-07-18 DIAGNOSIS — J441 Chronic obstructive pulmonary disease with (acute) exacerbation: Secondary | ICD-10-CM | POA: Diagnosis not present

## 2017-07-18 DIAGNOSIS — N186 End stage renal disease: Secondary | ICD-10-CM | POA: Diagnosis not present

## 2017-07-18 LAB — RENAL FUNCTION PANEL
ALBUMIN: 3.2 g/dL — AB (ref 3.5–5.0)
ANION GAP: 12 (ref 5–15)
BUN: 49 mg/dL — AB (ref 6–20)
CALCIUM: 8.1 mg/dL — AB (ref 8.9–10.3)
CO2: 26 mmol/L (ref 22–32)
Chloride: 96 mmol/L — ABNORMAL LOW (ref 101–111)
Creatinine, Ser: 4.12 mg/dL — ABNORMAL HIGH (ref 0.61–1.24)
GFR calc Af Amer: 14 mL/min — ABNORMAL LOW (ref >60)
GFR calc non Af Amer: 12 mL/min — ABNORMAL LOW (ref >60)
GLUCOSE: 153 mg/dL — AB (ref 65–99)
PHOSPHORUS: 5 mg/dL — AB (ref 2.5–4.6)
Potassium: 5.1 mmol/L (ref 3.5–5.1)
SODIUM: 134 mmol/L — AB (ref 135–145)

## 2017-07-18 LAB — CBC
HCT: 29.1 % — ABNORMAL LOW (ref 39.0–52.0)
HEMOGLOBIN: 9.1 g/dL — AB (ref 13.0–17.0)
MCH: 33 pg (ref 26.0–34.0)
MCHC: 31.3 g/dL (ref 30.0–36.0)
MCV: 105.4 fL — ABNORMAL HIGH (ref 78.0–100.0)
Platelets: 162 10*3/uL (ref 150–400)
RBC: 2.76 MIL/uL — ABNORMAL LOW (ref 4.22–5.81)
RDW: 17.6 % — AB (ref 11.5–15.5)
WBC: 12.7 10*3/uL — ABNORMAL HIGH (ref 4.0–10.5)

## 2017-07-18 MED ORDER — PREDNISONE 10 MG PO TABS: 10.0000 mg | ORAL_TABLET | Freq: Every day | ORAL | 0 refills | Status: DC

## 2017-07-18 NOTE — Discharge Summary (Signed)
Physician Discharge Summary  Terry [name]. JOA:[id number] DOB: 01/28/1935 DOA: 07/16/2017  PCP: [name], DO  Admit date: 07/16/2017 Discharge date: 07/18/2017  Time spent: 45 minutes  Recommendations for Outpatient Follow-up:  -To be discharged home today. -Advised to follow-up with primary care provider in 2 weeks.  Discharge Diagnoses:  Principal Problem:   COPD with acute exacerbation ([place]) Active Problems:   Essential hypertension   Chronic atrial fibrillation (HCC)   Chronic diastolic heart failure (HCC)   Anemia of chronic disease   Memory loss of unknown cause   CKD (chronic kidney disease) stage V requiring chronic dialysis (HCC)   PAF (paroxysmal atrial fibrillation) ([place])   Acute on chronic respiratory failure with hypoxia ([place])   End stage renal disease on dialysis [facility])   Discharge Condition: Stable and improved  Filed Weights   07/17/17 0415 07/17/17 1155 07/18/17 0457  Weight: 71.5 kg (157 lb 9.6 oz) 71.5 kg (157 lb 10.1 oz) 70.8 kg (156 lb)    History of present illness:  As per Dr. Shanon Brow on 1/25: Terry [name]. is a 83 y.o. male with medical history significant of COPD on 2 L of oxygen at home, end-stage renal disease on dialysis session due today that he missed, chronic A. fib, congestive heart failure, went outside today and when the cold air hit him it started to make him wheeze cough and became short of breath.  Ever since he went outside today he has been very short of breath and wheezing and is not been able to get it under control.  He did see his pulmonologist yesterday who started him on ciprofloxacin which she is only taken 1 dose.  Patient denies any fevers.  He denies any nausea vomiting or diarrhea.  He was also started on steroids yesterday.  Patient is referred for admission for wheezing and COPD exacerbation.  He denies any lower extremity edema or swelling or pain.    Hospital Course:    COPD with acute exacerbation/acute on  chronic hypoxemic respiratory failure -Patient is chronically on 2-3 L of oxygen. -Clinically improved, only minimal wheezing. -Discharged today on inhalers and steroid taper.  End-stage renal disease -Status post dialysis today.   -Appreciate nephrology input and recommendations. -We will continue dialysis as an outpatient as scheduled.  Left vision blurred -Significantly better since yesterday.   -Patient has history of prior cataract surgery in that eye, follows with Dr. Zadie Rhine with ophthalmology. -Have applied some lubricant eyedrops and he states that this has improved. -Have advised wife to secure outpatient follow-up with ophthalmology after discharge.  Paroxysmal A. fib -Stable, rate controlled    Procedures:  None  Consultations:  None  Discharge Instructions  Discharge Instructions    Diet - low sodium heart healthy   Complete by:  As directed    Increase activity slowly   Complete by:  As directed      Allergies as of 07/18/2017      Reactions   Yellow Dyes (non-tartrazine) Other (See Comments)   Burns arms Cough medications (tussionex and benzonatate ok)   Levaquin [levofloxacin In D5w] Itching      Medication List    TAKE these medications   Aclidinium Bromide 400 MCG/ACT Aepb Commonly known as:  TUDORZA PRESSAIR Inhale 2 puffs into the lungs daily. What changed:    how much to take  when to take this   albuterol (2.5 MG/3ML) 0.083% nebulizer solution Commonly known as:  PROVENTIL Take  3 mLs (2.5 mg total) by nebulization every 6 (six) hours as needed for wheezing or shortness of breath.   aspirin EC 325 MG tablet Take 1 tablet (325 mg total) by mouth daily. What changed:  how much to take   atorvastatin 20 MG tablet Commonly known as:  LIPITOR Take 1 tablet (20 mg total) by mouth daily. What changed:  when to take this   calcium acetate 667 MG capsule Commonly known as:  PHOSLO Take 667-1,334 mg by mouth 3 (three) times daily  with meals.   chlorpheniramine-HYDROcodone 10-8 MG/5ML Suer Commonly known as:  TUSSIONEX PENNKINETIC ER Take 5 mLs by mouth every 12 (twelve) hours as needed for cough.   cholecalciferol 1000 units tablet Commonly known as:  VITAMIN D Take 1,000 Units by mouth daily.   ciprofloxacin 500 MG tablet Commonly known as:  CIPRO Take 1 tablet (500 mg total) by mouth 2 (two) times daily for 10 days.   ethyl chloride spray Apply 1 application topically See admin instructions. For dialysis   methocarbamol 500 MG tablet Commonly known as:  ROBAXIN Take 500 mg by mouth 2 (two) times daily as needed for muscle spasms.   metoprolol succinate 50 MG 24 hr tablet Commonly known as:  TOPROL-XL Take 50 mg by mouth 2 (two) times daily. Take with or immediately following a meal.   midodrine 10 MG tablet Commonly known as:  PROAMATINE Take 10 mg by mouth as needed. At dialysis for low pressure   multivitamin Tabs tablet Take 1 tablet by mouth daily.   OXYGEN Inhale 1 L into the lungs at bedtime.   predniSONE 10 MG tablet Commonly known as:  DELTASONE Take 1 tablet (10 mg total) by mouth daily with breakfast. Take 6 tablets today and then decrease by 1 tablet daily until none are left. What changed:    how much to take  additional instructions   SYMBICORT 160-4.5 MCG/ACT inhaler Generic drug:  budesonide-formoterol INHALE 2 PUFFS TWICE DAILY      Allergies  Allergen Reactions  . Yellow Dyes (Non-Tartrazine) Other (See Comments)    Burns arms Cough medications (tussionex and benzonatate ok)  . Levaquin [Levofloxacin In D5w] Itching   Follow-up Information    [name], DO. Schedule an appointment as soon as possible for a visit in 2 week(s).   Specialty:  Family Medicine Contact information: [name] [address] [phone]            The results of significant diagnostics from this hospitalization (including imaging, microbiology,  ancillary and laboratory) are listed below for reference.    Significant Diagnostic Studies: Dg Chest 2 View  Result Date: 07/16/2017 CLINICAL DATA:  Sudden onset of shortness of breath. Patient is on dialysis and home oxygen therapy. EXAM: CHEST  2 VIEW COMPARISON:  07/15/2017 FINDINGS: Mildly enlarged heart. Calcific atherosclerotic disease of the aorta. Mediastinal contours appear intact. Mild prominence of the interstitium. Bilateral pleural effusions. Bibasilar airspace consolidation versus atelectasis. Osseous structures are without acute abnormality. Soft tissues are grossly normal. IMPRESSION: Mildly enlarged heart with pulmonary vascular congestion. Bilateral small pleural effusions. Bibasilar airspace consolidation versus atelectasis. Electronically Signed   By: Fidela Salisbury M.D.   On: 07/16/2017 13:02   Dg Chest 2 View  Result Date: 07/15/2017 CLINICAL DATA:  Cough and shortness of breath EXAM: CHEST  2 VIEW COMPARISON:  June 20, 2017 FINDINGS: There is mild scarring in the lung bases. There is no edema or consolidation. Heart size  and pulmonary vascularity are normal. There is aortic atherosclerosis. No adenopathy. There is degenerative change in the thoracic spine and in each shoulder. IMPRESSION: Bibasilar scarring. No edema or consolidation. Aortic atherosclerosis. Aortic Atherosclerosis (ICD10-I70.0). Electronically Signed   By: Lowella Grip III M.D.   On: 07/15/2017 09:59   Dg Chest 2 View  Result Date: 06/20/2017 CLINICAL DATA:  Productive cough for 1 week EXAM: CHEST  2 VIEW COMPARISON:  May 20, 2017 FINDINGS: The heart size and mediastinal contours are within normal limits. There is no focal infiltrate, pulmonary edema, or pleural effusion. The lungs are hyperinflated. Mild atelectasis of right lung base is noted. The visualized skeletal structures are unremarkable. IMPRESSION: No active cardiopulmonary disease. Emphysema. Mild atelectasis of right lung base.  Electronically Signed   By: Abelardo Diesel M.D.   On: 06/20/2017 18:20   Ct Chest Wo Contrast  Result Date: 07/16/2017 CLINICAL DATA:  Acute onset dyspnea EXAM: CT CHEST WITHOUT CONTRAST TECHNIQUE: Multidetector CT imaging of the chest was performed following the standard protocol without IV contrast. COMPARISON:  03/11/2017 chest CT FINDINGS: Cardiovascular: Normal branch pattern of the great vessels with atherosclerotic calcifications noted proximally. Moderate aortic atherosclerosis with left main and three-vessel coronary arteriosclerosis is identified. The heart is enlarged without pericardial effusion or thickening. Mediastinum/Nodes: Calcified right hilar and mediastinal lymph nodes are present without adenopathy likely related to old granulomatous disease. Slight narrowing of the transverse dimension of the trachea in the superior mediastinum likely on the basis of mild thyromegaly. The thyroid gland extends caudad to the level of the clavicular heads. No dominant mass is seen however. CT appearance of the esophagus is unremarkable. Lungs/Pleura: Linear scarring or atelectasis in the left upper lobe. Dependent and subsegmental atelectasis is noted in both lower lobes slightly more confluent in the right lower lobe. Mild peribronchial thickening is noted bilaterally. Centrilobular emphysema, upper lobe predominant is visualized. No dominant mass, effusion or pneumothorax. Atelectasis Upper Abdomen: Subcentimeter hypodensities in the left hepatic lobe the largest approximately 8 mm are identified more likely to represent cysts or hemangiomata. Mild surface nodularity of the liver may represent morphologic changes of cirrhosis. Recannulized umbilical vein is partially included on this study. Splenic granulomas are present. Numerous bilateral renal cysts some of which appear hyperdense and complex are noted of the included upper poles of both kidneys. The adrenal glands are unremarkable. The pancreas is  nonacute. Musculoskeletal: Thoracic spondylosis without acute appearing osseous abnormality. IMPRESSION: 1. Stable cardiomegaly with left main and three-vessel coronary arteriosclerosis. 2. Centrilobular emphysema with stable left apical linear scarring. 3. Bibasilar atelectasis right greater than left slightly more confluent in the right lower lobe since prior exam. 4. Subcentimeter hypodensities within the liver more commonly associated with cysts or hemangiomata. Morphologic appearance of mild cirrhosis with slightly recannulized umbilical vein. 5. Simple and complex renal cysts are redemonstrated. 6. Splenic granulomata. 7. Mediastinal and hilar granulomata. 8. Mild thyromegaly extending to level of the clavicular heads causing slight transverse narrowing of the trachea from extrinsic mass effect. Aortic Atherosclerosis (ICD10-I70.0) and Emphysema (ICD10-J43.9). Electronically Signed   By: Ashley Royalty M.D.   On: 07/16/2017 14:04    Microbiology: No results found for this or any previous visit (from the past 240 hour(s)).   Labs: Basic Metabolic Panel: Recent Labs  Lab 07/16/17 1253 07/17/17 0539 07/18/17 0514  NA 140 137 134*  K 5.2* 5.2* 5.1  CL 98* 98* 96*  CO2 27 25 26   GLUCOSE 143* 142* 153*  BUN 72* 89*  49*  CREATININE 5.83* 7.02* 4.12*  CALCIUM 8.6* 8.3* 8.1*  PHOS  --   --  5.0*   Liver Function Tests: Recent Labs  Lab 07/18/17 0514  ALBUMIN 3.2*   No results for input(s): LIPASE, AMYLASE in the last 168 hours. No results for input(s): AMMONIA in the last 168 hours. CBC: Recent Labs  Lab 07/16/17 1253 07/17/17 0539 07/18/17 0513  WBC 12.2* 9.7 12.7*  NEUTROABS 11.2*  --   --   HGB 9.5* 9.2* 9.1*  HCT 30.5* 29.7* 29.1*  MCV 106.6* 106.1* 105.4*  PLT 157 165 162   Cardiac Enzymes: No results for input(s): CKTOTAL, CKMB, CKMBINDEX, TROPONINI in the last 168 hours. BNP: BNP (last 3 results) Recent Labs    09/23/16 2216  BNP 1,438.4*    ProBNP (last 3  results) No results for input(s): PROBNP in the last 8760 hours.  CBG: No results for input(s): GLUCAP in the last 168 hours.     Signed:  Lelon Frohlich  [facility] Pager: [phone] 07/18/2017, 4:54 PM

## 2017-07-18 NOTE — Progress Notes (Signed)
Subjective: Interval History: has no complaint of difficulty breathing or cough.  Patient is alert is feeling better and ready to go home..  Objective: Vital signs in last 24 hours: Temp:  [97.8 F (36.6 C)-98.3 F (36.8 C)] 98 F (36.7 C) (01/27 0457) Pulse Rate:  [78-107] 78 (01/27 0457) Resp:  [16] 16 (01/27 0457) BP: (106-177)/(58-80) 161/61 (01/27 0457) SpO2:  [96 %-97 %] 96 % (01/27 0729) Weight:  [70.8 kg (156 lb)-71.5 kg (157 lb 10.1 oz)] 70.8 kg (156 lb) (01/27 0457) Weight change: 0.739 kg (1 lb 10.1 oz)  Intake/Output from previous day: 01/26 0701 - 01/27 0700 In: 720 [P.O.:720] Out: 2900 [Urine:400] Intake/Output this shift: No intake/output data recorded.  General appearance: alert, cooperative and no distress Resp: diminished breath sounds bilaterally Cardio: regular rate and rhythm Extremities: No edema  Lab Results: Recent Labs    07/17/17 0539 07/18/17 0513  WBC 9.7 12.7*  HGB 9.2* 9.1*  HCT 29.7* 29.1*  PLT 165 162   BMET:  Recent Labs    07/17/17 0539 07/18/17 0514  NA 137 134*  K 5.2* 5.1  CL 98* 96*  CO2 25 26  GLUCOSE 142* 153*  BUN 89* 49*  CREATININE 7.02* 4.12*  CALCIUM 8.3* 8.1*   No results for input(s): PTH in the last 72 hours. Iron Studies: No results for input(s): IRON, TIBC, TRANSFERRIN, FERRITIN in the last 72 hours.  Studies/Results: Dg Chest 2 View  Result Date: 07/16/2017 CLINICAL DATA:  Sudden onset of shortness of breath. Patient is on dialysis and home oxygen therapy. EXAM: CHEST  2 VIEW COMPARISON:  07/15/2017 FINDINGS: Mildly enlarged heart. Calcific atherosclerotic disease of the aorta. Mediastinal contours appear intact. Mild prominence of the interstitium. Bilateral pleural effusions. Bibasilar airspace consolidation versus atelectasis. Osseous structures are without acute abnormality. Soft tissues are grossly normal. IMPRESSION: Mildly enlarged heart with pulmonary vascular congestion. Bilateral small pleural  effusions. Bibasilar airspace consolidation versus atelectasis. Electronically Signed   By: Fidela Salisbury M.D.   On: 07/16/2017 13:02   Ct Chest Wo Contrast  Result Date: 07/16/2017 CLINICAL DATA:  Acute onset dyspnea EXAM: CT CHEST WITHOUT CONTRAST TECHNIQUE: Multidetector CT imaging of the chest was performed following the standard protocol without IV contrast. COMPARISON:  03/11/2017 chest CT FINDINGS: Cardiovascular: Normal branch pattern of the great vessels with atherosclerotic calcifications noted proximally. Moderate aortic atherosclerosis with left main and three-vessel coronary arteriosclerosis is identified. The heart is enlarged without pericardial effusion or thickening. Mediastinum/Nodes: Calcified right hilar and mediastinal lymph nodes are present without adenopathy likely related to old granulomatous disease. Slight narrowing of the transverse dimension of the trachea in the superior mediastinum likely on the basis of mild thyromegaly. The thyroid gland extends caudad to the level of the clavicular heads. No dominant mass is seen however. CT appearance of the esophagus is unremarkable. Lungs/Pleura: Linear scarring or atelectasis in the left upper lobe. Dependent and subsegmental atelectasis is noted in both lower lobes slightly more confluent in the right lower lobe. Mild peribronchial thickening is noted bilaterally. Centrilobular emphysema, upper lobe predominant is visualized. No dominant mass, effusion or pneumothorax. Atelectasis Upper Abdomen: Subcentimeter hypodensities in the left hepatic lobe the largest approximately 8 mm are identified more likely to represent cysts or hemangiomata. Mild surface nodularity of the liver may represent morphologic changes of cirrhosis. Recannulized umbilical vein is partially included on this study. Splenic granulomas are present. Numerous bilateral renal cysts some of which appear hyperdense and complex are noted of the included upper  poles of  both kidneys. The adrenal glands are unremarkable. The pancreas is nonacute. Musculoskeletal: Thoracic spondylosis without acute appearing osseous abnormality. IMPRESSION: 1. Stable cardiomegaly with left main and three-vessel coronary arteriosclerosis. 2. Centrilobular emphysema with stable left apical linear scarring. 3. Bibasilar atelectasis right greater than left slightly more confluent in the right lower lobe since prior exam. 4. Subcentimeter hypodensities within the liver more commonly associated with cysts or hemangiomata. Morphologic appearance of mild cirrhosis with slightly recannulized umbilical vein. 5. Simple and complex renal cysts are redemonstrated. 6. Splenic granulomata. 7. Mediastinal and hilar granulomata. 8. Mild thyromegaly extending to level of the clavicular heads causing slight transverse narrowing of the trachea from extrinsic mass effect. Aortic Atherosclerosis (ICD10-I70.0) and Emphysema (ICD10-J43.9). Electronically Signed   By: Ashley Royalty M.D.   On: 07/16/2017 14:04    I have reviewed the patient's current medications.  Assessment/Plan: 1] difficulty in breathing: Possibly a combination of exacerbation of his COPD and fluid overload.  Patient had his dialysis yesterday and were able to remove about 2500 cc.  Patient presently is feeling much better and asking to go home. 2] bone and mineral disorder: His calcium and phosphorus is range.  Presently he is on PhosLo. 3] anemia: His hemoglobin is below our target goal.  9.1.  4] hypotension: His blood pressure is reasonable and tolerated dialysis.  5] COPD with exacerbation.  He is on inhaler and steroid feeling better.  Patient is on home oxygen and he states that he use it at mostly at night. 6] coronary artery disease: Patient is a symptomatic Plan: Patient does require dialysis today.  His next dialysis will be on Tuesday which is his regular schedule.  Patient is going to be discharged his dialysis will be done as an  outpatient at his regular dialysis unit.    LOS: 0 days   Allisson Schindel S 07/18/2017,10:06 AM

## 2017-07-18 NOTE — Progress Notes (Signed)
Discharge instructions gone over with patient and spouse, verbalized understanding. IV removed, patient tolerated procedure well. Printed prescription given to patient. Went over details of a heart healthy diet with patient, he verbalized understanding.

## 2017-07-19 ENCOUNTER — Telehealth: Payer: Self-pay | Admitting: Family Medicine

## 2017-07-19 DIAGNOSIS — N2581 Secondary hyperparathyroidism of renal origin: Secondary | ICD-10-CM | POA: Diagnosis not present

## 2017-07-19 DIAGNOSIS — N186 End stage renal disease: Secondary | ICD-10-CM | POA: Diagnosis not present

## 2017-07-19 NOTE — Telephone Encounter (Signed)
I spoke with wife and pt is going to follow up with Pulmonary tomorrow, he was at dialysis right now. Declined our TCM call.

## 2017-07-20 ENCOUNTER — Encounter: Payer: Self-pay | Admitting: Internal Medicine

## 2017-07-20 ENCOUNTER — Ambulatory Visit: Payer: Medicare PPO | Admitting: Internal Medicine

## 2017-07-20 VITALS — BP 164/78 | HR 74 | Ht 68.0 in | Wt 156.0 lb

## 2017-07-20 DIAGNOSIS — J449 Chronic obstructive pulmonary disease, unspecified: Secondary | ICD-10-CM

## 2017-07-20 DIAGNOSIS — J9611 Chronic respiratory failure with hypoxia: Secondary | ICD-10-CM

## 2017-07-20 MED ORDER — ACLIDINIUM BROMIDE 400 MCG/ACT IN AEPB: 1.0000 | INHALATION_SPRAY | Freq: Two times a day (BID) | RESPIRATORY_TRACT | 5 refills | Status: DC

## 2017-07-20 MED ORDER — AZITHROMYCIN 250 MG PO TABS: ORAL_TABLET | ORAL | 11 refills | Status: DC

## 2017-07-20 MED ORDER — ALBUTEROL SULFATE HFA 108 (90 BASE) MCG/ACT IN AERS: INHALATION_SPRAY | RESPIRATORY_TRACT | 11 refills | Status: DC

## 2017-07-20 NOTE — Patient Instructions (Signed)
Plan A = Automatic = symbicort / tudorza   Plan B = Backup Only use your albuterol as a rescue medication to be used if you can't catch your breath by resting or doing a relaxed purse lip breathing pattern.  - The less you use it, the better it will work when you need it. - Ok to use the inhaler up to 2 puffs  every 4 hours if you must but call for appointment if use goes up over your usual need - Don't leave home without it !!  (think of it like the spare tire for your car)   Plan C = Crisis - only use your albuterol nebulizer if you first try Plan B and it fails to help > ok to use the nebulizer up to every 4 hours but if start needing it regularly call for immediate appointment   Plan D = Deltasone (prednisone) -  Double the dose that you are on until better then taper back to a floor of 10 mg daily   Plan E = ER - go to ER or call 911 if all else fails    See Tammy NP w/in 2 weeks with all your medications, even over the counter meds, separated in two separate bags, the ones you take no matter what vs the ones you stop once you feel better and take only as needed when you feel you need them.   Tammy  will generate for you a new user friendly medication calendar that will put [place] all on the same page re: your medication use.     Without this process, it simply isn't possible to assure that we are providing  your outpatient care  with  the attention to detail we feel you deserve.   If we cannot assure that you're getting that kind of care,  then we cannot manage your problem effectively from this clinic.  Once you have seen Tammy and we are sure that we're all on the same page with your medication use she will arrange follow up with me.

## 2017-07-20 NOTE — Progress Notes (Signed)
Subjective:    Patient ID: Terry [name], male    DOB: 1935/05/12    MRN: [id number]   Brief patient profile:  89 yowm quit smoking 2001 bothered by doe seemed better then worse in 2010 p "lung infection" and stayed on advair/spiriva  Combo since 2012 seemed better then admitted in [facility] with pna 10/09/12  in then transferred to [facility] NH x 3 weeks and discharged  On May 1st  2014 referred to pulmonary clinic 11/29/12  by [facility] for copd eval and proved to have GOLD III copd 01/26/13 with symptoms much better off ACEi.    History of Present Illness 08/27/2015  Extended post hosp /transition of care  f/u ov/[name] re: COPD GOLD III recurrent admits/ now chronic resp failure/HD dep CRI plus GOLD III rx symb/spriiva/neb saba Chief Complaint  Patient presents with  . Follow-up    Pt c/o occasional cough with white mucus. Pt denies wheeze/increased SOB/CP/tightness. Pt is on O2/2L, he states that on 3L it dries out his nose and burns. Pt also c/o low BP readings and wonders if some of his HTN meds need to be changed.   now sleeping in a recliner since admit 30-45 degrees due to sob/some noct cough but excess/ purulent sputum or mucus plugs   On prednisone with floor of   @ 10 mg since d/c 08/19/15  rec May need to consider adding Oracit to your regimen and I will le Dr Terry [name] know You will need to adjust your 02 to saturations of over 90%  Best fit and humidified 02 ordered  Plan A = Automatic = Symbicort 160 2 in am and 2 in pm with spiriva each am and prednisone 10 mg with breakfast daily  Work on inhaler technique:  Plan B = Backup Only use your albuterol (proventil) as a rescue medication Plan C = Crisis - only use your albuterol nebulizer if you first try Plan B and it fails to help > ok to use the nebulizer up to every 4 hours but if start needing it regularly call for immediate appointment    01/07/2016  f/u ov/Terry [name] re:  Copd GOLD III/ rx 5 prednisone / symb/spiriva and rare saba   (maybe once a week)  02 hs only  Chief Complaint  Patient presents with  . Follow-up    pt states breathing is baseline since last OV. c/o sob & non prod cough.  02 1lpm hs rarely during the day / c/o dry mouth ? From spiriva dpi Doe = MMRC3 = can't walk 100 yards even at a slow pace at a flat grade s stopping due to sob   rec Plan A = Automatic = symbicort 160 Take 2 puffs first thing in am and then another 2 puffs about 12 hours later and spiriva 1.25 x 4 or 2.5 x 2puffs in am only  Plan B = Backup Only use your albuterol (ventolin) as a rescue medication  Plan C = Crisis - only use your albuterol nebulizer if you first try Plan B and it fails to help > ok to use the nebulizer up to every 4 hours but if start needing it regularly call for immediate appointment Prednisone 5 mg daily   Please schedule a follow up visit in 3 months but call sooner if needed     09/29/16 NP rec Begin Tudorza 1 puff Twice daily , brush rinse and gargle after use. > bothered mouth  Finish Tamiflu and Cipro .  Taper  Prednisone as directed to 10mg  1/2 daily .  Follow med calendar closely and bring to each visit.       12/10/2016  f/u ov/Terry [name] re: GOLD III copd symbicort 160 2bid / still on prednisone 5 mg daily - 02 hs only x 1lpm  Chief Complaint  Patient presents with  . Follow-up    Pt stopped the Todurza due to it making his mouth hurt, Pt states his breathing is doing well, he still slight cough but non productive, Denies wheezing,chest tightness   mb and back s stopping which is def better and no change off lama and off amb 02  rec Follow med calendar and bring to each ov    02/02/17 acute ov/Terry [name] with worse breathing for sev days assoc with L cp rec pred taper zpak for L apex as dz    02/09/2017 acute extended ov/Terry [name] re: f/u pna/ worse sob on symb 160 2bid maint and pred 5 mg daily  Chief Complaint  Patient presents with  . Acute Visit    Pt states 2 nights ago he got up to use the  bathroom and then could not catch his breath- neb was taken and this helped.    L cp resolved p zpak but now doe x room to room not monitoring 02 with activity  rec Plan A = Automatic = symbicort 160 Take 2 puffs first thing in am/ chase with spiriva one capsule  and then another 2 puffs of symbicort  about 12 hours later.  Also automatically wear the 02 at bedtime then during the day as needed to  keep your saturations above 90% at all times  Plan B = Backup Only use your albuterol as a rescue medication Plan C = Crisis - only use your albuterol nebulizer if you first try Plan B and it fails to help > ok to use the nebulizer up to every 4 hours but if start needing it regularly call for immediate appointment Please remember to go to the  x-ray department downstairs in the basement  for your tests - we will call you with the results when they are available. Cancel follow up with Terry Form NP and keep appt to see me 03/09/17 - bring your updated med calendar with you  Add:  cipro 750 bid x 10 days and repeat cxr at next ov      03/09/2017  f/u ov/Terry [name] re:   LUL pna/ copd III/ 02 dep resp failure / new ex cp / did not bring med calendar, cannot recall action plans / main rx symb/spiriva/ pred at 5 mg daily  Chief Complaint  Patient presents with  . Follow-up    CXR repeated today. Pt c/o increased SOB and non prod cough x 5 days. He has CP that comes and goes "it's a muscle"- occurs with exertion and subsides with rest.    new pattern cp with exertion over the last month/ can't tell any difference on vs off 02 - discomfort is similar to prev angina but no longer uses ntg prn and not sure he even has it, no assoc pleuritic cp/ no nausea/vomiting/diaph and never at rest or noct - resolves in a few min at rest. Using 02 1lpm hs and prn daytime to keep 02  Acutely worse doe x 5 days with dry cough No worse before vs p HD and no assoc leg swelling rec See calendar for specific medication  instructions and bring it back for each and every office  visit for every healthcare provider you see.  Without it,  you may not receive the best quality medical care that we feel you deserve.  You will note that the calendar groups together  your maintenance  medications that are timed at particular times of the day.  Think of this as your checklist for what your doctor has instructed you to do until your next evaluation to see what benefit  there is  to staying on a consistent group of medications intended to keep you well.  The other group at the bottom is entirely up to you to use as you see fit  for specific symptoms that may arise between visits that require you to treat them on an as needed basis.  Think of this as your action plan or "what if" list.   Separating the top medications from the bottom group is fundamental to providing you adequate care going forward.    When breathing is getting worse and needing your rescue inhaler more often than twice daily then double prednisone until better then back to one half daily   Adjust your 02 to keep your saturations above 90% at all times     04/20/2017  f/u ov/Trinton Prewitt re:  Copd /  02 1lpm at hs and never needing otherwise / still on pred 5 mg daily  Chief Complaint  Patient presents with  . Follow-up    Pt states that he has been doing good. Denies any c/o cough, SOB, or CP.   walking at a slow pace s 02 and says sats stay  in low 90s Doe = MMRC2 = can't walk a nl pace on a flat grade s sob but does fine slow and flat eg shopping Rare need for saba at all / no med calendar " I drove my wife's car" and the calendar is in the other car rec Ok to try prednisone 5 mg one half every other day to see if it makes any diffrence and if not stay on this dose  See calendar for specific medication instructions and bring it back for each and every office visit for every healthcare provider you see.  Without it,  you may not receive the best quality medical  care that we feel you deserve.      07/20/2017  f/u ov/Waymon Laser re:  Transition of care Copd/ 02 1-2lpm  Chief Complaint  Patient presents with  . Follow-up    Was at [facility] 1/25-/127/19 for copd exacerbation. Given steroids and abx. Mildly feeling better.   Dyspnea: across the room on 02 Cough: dry/ does not keep him up as long as takes delsym Sleep: 30 degrees in recliner and 1lpm  Saba:   Daytime only    No obvious day to day or daytime variability or assoc excess/ purulent sputum or mucus plugs or hemoptysis or cp or chest tightness, subjective wheeze or overt sinus or hb symptoms. No unusual exposure hx or h/o childhood pna/ asthma or knowledge of premature birth.  Sleeping  In recliner 30 degrees 1lpm without nocturnal  or early am exacerbation  of respiratory  c/o's or need for noct saba. Also denies any obvious fluctuation of symptoms with weather or environmental changes or other aggravating or alleviating factors except as outlined above   Current Allergies, Complete Past Medical History, Past Surgical History, Family History, and Social History were reviewed in [employer] record.  ROS  The following are not active complaints unless bolded Hoarseness, sore throat,  dysphagia, dental problems, itching, sneezing,  nasal congestion or discharge of excess mucus or purulent secretions, ear ache,   fever, chills, sweats, unintended wt loss or wt gain, classically pleuritic or exertional cp,  orthopnea pnd or leg swelling, presyncope, palpitations, abdominal pain, anorexia, nausea, vomiting, diarrhea  or change in bowel habits or change in bladder habits, change in stools or change in urine, dysuria, hematuria,  rash, arthralgias, visual complaints, headache, numbness, weakness or ataxia or problems with walking or coordination,  change in mood/affect or memory.        Current Meds  Medication Sig  . Aclidinium Bromide (TUDORZA PRESSAIR) 400 MCG/ACT AEPB Inhale 1  puff into the lungs 2 (two) times daily.  [name] albuterol (PROVENTIL) (2.5 MG/3ML) 0.083% nebulizer solution Take 3 mLs (2.5 mg total) by nebulization every 6 (six) hours as needed for wheezing or shortness of breath.  [name] aspirin EC 325 MG tablet Take 1 tablet (325 mg total) by mouth daily. (Patient taking differently: Take 162 mg by mouth daily. )  . calcium acetate (PHOSLO) 667 MG capsule Take 667-1,334 mg by mouth 3 (three) times daily with meals.   . cholecalciferol (VITAMIN D) 1000 units tablet Take 1,000 Units by mouth daily.  . [EXPIRED] ciprofloxacin (CIPRO) 500 MG tablet Take 1 tablet (500 mg total) by mouth 2 (two) times daily for 10 days.  [name] ethyl chloride spray Apply 1 application topically See admin instructions. For dialysis  . methocarbamol (ROBAXIN) 500 MG tablet Take 500 mg by mouth 2 (two) times daily as needed for muscle spasms.  . metoprolol succinate (TOPROL-XL) 50 MG 24 hr tablet Take 50 mg by mouth 2 (two) times daily. Take with or immediately following a meal.   . midodrine (PROAMATINE) 10 MG tablet Take 10 mg by mouth as needed. At dialysis for low pressure  . multivitamin (RENA-VIT) TABS tablet Take 1 tablet by mouth daily.  . OXYGEN Inhale 1 L into the lungs at bedtime.   . predniSONE (DELTASONE) 10 MG tablet Take 1 tablet (10 mg total) by mouth daily with breakfast. Take 6 tablets today and then decrease by 1 tablet daily until none are left.  . SYMBICORT 160-4.5 MCG/ACT inhaler INHALE 2 PUFFS TWICE DAILY  . [DISCONTINUED] Aclidinium Bromide (TUDORZA PRESSAIR) 400 MCG/ACT AEPB Inhale 2 puffs into the lungs daily. (Patient taking differently: Inhale 1 puff into the lungs 2 (two) times daily. )  . [DISCONTINUED] atorvastatin (LIPITOR) 20 MG tablet Take 1 tablet (20 mg total) by mouth daily. (Patient taking differently: Take 20 mg by mouth every evening. )              Objective:   Physical Exam  Somber wm nad in w/c   02/26/2015          210 >  08/27/2015 163 p HD >  09/26/2015  159 > 11/26/2015  160 > 01/07/2016  159 > 04/07/2016  159 > 05/06/2016>   07/21/2016  157 > 08/13/2016  152  > 12/10/2016  150 > 02/09/2017  152 > 03/09/2017  156 > 04/20/2017  152 > 05/20/2017  152 >    07/20/2017 156    Vital signs reviewed - Note on arrival 02 sats  92% on 2lpm           LUNGS: no acc muscle use,  Moderately Barrel chest, decreased BS  s localized or gen wheeze      HEENT: nl dentition, turbinates bilaterally, and oropharynx. Nl external ear canals without  cough reflex   NECK :  without JVD/Nodes/TM/ nl carotid upstrokes bilaterally   LUNGS: no acc muscle use,   Barrel contour chest wall with bilateral  Decreased bs  without cough on insp or exp maneuver and hyper resonant to  percussion   Bilaterally   CV:  RRR  no s3 or murmur or increase in P2, and no edema   ABD:  soft and nontender with nl inspiratory excursion in the supine position. No bruits or organomegaly appreciated, bowel sounds nl  MS:  Nl gait/ ext warm without deformities, calf tenderness, cyanosis or clubbing No obvious joint restrictions   SKIN: warm and dry without lesions    NEURO:  alert, approp, nl sensorium with  no motor or cerebellar deficits apparent.             I personally reviewed images and agree with radiology impression as follows:   Chest CT 07/16/17 1. Stable cardiomegaly with left main and three-vessel coronary arteriosclerosis. 2. Centrilobular emphysema with stable left apical linear scarring. 3. Bibasilar atelectasis right greater than left slightly more confluent in the right lower lobe since prior exam.

## 2017-07-21 ENCOUNTER — Other Ambulatory Visit: Payer: Self-pay | Admitting: Cardiovascular Disease

## 2017-07-21 DIAGNOSIS — N186 End stage renal disease: Secondary | ICD-10-CM | POA: Diagnosis not present

## 2017-07-21 DIAGNOSIS — N2581 Secondary hyperparathyroidism of renal origin: Secondary | ICD-10-CM | POA: Diagnosis not present

## 2017-07-22 DIAGNOSIS — N186 End stage renal disease: Secondary | ICD-10-CM | POA: Diagnosis not present

## 2017-07-22 DIAGNOSIS — Z992 Dependence on renal dialysis: Secondary | ICD-10-CM | POA: Diagnosis not present

## 2017-07-22 DIAGNOSIS — E1122 Type 2 diabetes mellitus with diabetic chronic kidney disease: Secondary | ICD-10-CM | POA: Diagnosis not present

## 2017-07-23 DIAGNOSIS — N186 End stage renal disease: Secondary | ICD-10-CM | POA: Diagnosis not present

## 2017-07-23 DIAGNOSIS — N2581 Secondary hyperparathyroidism of renal origin: Secondary | ICD-10-CM | POA: Diagnosis not present

## 2017-07-23 DIAGNOSIS — E1122 Type 2 diabetes mellitus with diabetic chronic kidney disease: Secondary | ICD-10-CM | POA: Diagnosis not present

## 2017-07-23 DIAGNOSIS — Z992 Dependence on renal dialysis: Secondary | ICD-10-CM | POA: Diagnosis not present

## 2017-07-26 DIAGNOSIS — Z992 Dependence on renal dialysis: Secondary | ICD-10-CM | POA: Diagnosis not present

## 2017-07-26 DIAGNOSIS — N186 End stage renal disease: Secondary | ICD-10-CM | POA: Diagnosis not present

## 2017-07-28 DIAGNOSIS — N186 End stage renal disease: Secondary | ICD-10-CM | POA: Diagnosis not present

## 2017-07-28 DIAGNOSIS — Z992 Dependence on renal dialysis: Secondary | ICD-10-CM | POA: Diagnosis not present

## 2017-07-30 DIAGNOSIS — Z992 Dependence on renal dialysis: Secondary | ICD-10-CM | POA: Diagnosis not present

## 2017-07-30 DIAGNOSIS — N186 End stage renal disease: Secondary | ICD-10-CM | POA: Diagnosis not present

## 2017-08-02 ENCOUNTER — Other Ambulatory Visit: Payer: Self-pay | Admitting: Internal Medicine

## 2017-08-02 DIAGNOSIS — N186 End stage renal disease: Secondary | ICD-10-CM | POA: Diagnosis not present

## 2017-08-02 DIAGNOSIS — N2581 Secondary hyperparathyroidism of renal origin: Secondary | ICD-10-CM | POA: Diagnosis not present

## 2017-08-03 ENCOUNTER — Encounter: Payer: Self-pay | Admitting: Internal Medicine

## 2017-08-03 NOTE — Assessment & Plan Note (Signed)
02 rx as of  hosp discharge 08/14/15   As of 07/20/2017 =  1lpm HS and prn daytime to keep sats > 90%

## 2017-08-03 NOTE — Assessment & Plan Note (Signed)
-   Trial off ACEi 11/30/2012 >>> much better symptom control - 05/28/2014   Walked RA x one lap @ 185 stopped due to  [employer] / sats ok/ mod pace - PFT's 01/26/13      FEV1  1.28 (48%) ratio 48 and no better p B2,  DLCO 39 corrects to 48%  - PFTs 02/26/2015   FEV1 0.99  (35 % ) ratio 35  p 11 % improvement from saba with DLCO  24 % corrects to 35  % for alv volume  p am symb/spiriva    - Pred maint rx  Since d/c 08/14/15  - 09/26/2015  extensive coaching HFA effectiveness =    75%  - 11/26/2015 rec reduce pred to 5 mg daily   - 05/22/2016  After extensive coaching HFA effectiveness =  90%  - 07/21/2016 try off spiriva due to throat irritation > restarted at rec of wife during cough but did not help it so d/c'd again 08/13/2016 with plan to change to spriva smi if breathing worse  - 02/09/2017 added spiriva  - 04/20/2017  After extensive coaching HFA effectiveness =    90%   Remains steroid and 02 dependent but back to near baseline on present complex rx   I had an extended discussion with the patient reviewing all relevant studies completed to date and  lasting 15 to 20 minutes of a 25 minute transition of care post hosp  office visit    Each maintenance medication was reviewed in detail including most importantly the difference between maintenance and prns and under what circumstances the prns are to be triggered using an action plan format that is not reflected in the computer generated alphabetically organized AVS but trather by a customized med calendar that reflects the AVS meds with confirmed 100% correlation.   In addition, Please see AVS for unique instructions that I personally wrote and verbalized to the the pt in detail and then reviewed with pt  by my nurse highlighting any  changes in therapy recommended at today's visit to their plan of care.

## 2017-08-04 ENCOUNTER — Other Ambulatory Visit: Payer: Self-pay | Admitting: Internal Medicine

## 2017-08-04 DIAGNOSIS — N2581 Secondary hyperparathyroidism of renal origin: Secondary | ICD-10-CM | POA: Diagnosis not present

## 2017-08-04 DIAGNOSIS — N186 End stage renal disease: Secondary | ICD-10-CM | POA: Diagnosis not present

## 2017-08-04 DIAGNOSIS — J449 Chronic obstructive pulmonary disease, unspecified: Secondary | ICD-10-CM | POA: Diagnosis not present

## 2017-08-04 NOTE — Telephone Encounter (Signed)
MW please advise patients wife is stating that patient needs to stay om prednisone 10 daily. Is it ok for me to send in new prescription, thanks

## 2017-08-05 ENCOUNTER — Other Ambulatory Visit: Payer: Self-pay | Admitting: Internal Medicine

## 2017-08-05 MED ORDER — PREDNISONE 10 MG PO TABS: 10.0000 mg | ORAL_TABLET | Freq: Every day | ORAL | 0 refills | Status: DC

## 2017-08-06 DIAGNOSIS — N186 End stage renal disease: Secondary | ICD-10-CM | POA: Diagnosis not present

## 2017-08-06 DIAGNOSIS — N2581 Secondary hyperparathyroidism of renal origin: Secondary | ICD-10-CM | POA: Diagnosis not present

## 2017-08-09 DIAGNOSIS — N186 End stage renal disease: Secondary | ICD-10-CM | POA: Diagnosis not present

## 2017-08-10 ENCOUNTER — Ambulatory Visit: Payer: Medicare PPO | Admitting: Physician Assistant

## 2017-08-10 ENCOUNTER — Encounter: Payer: Medicare PPO | Admitting: Adult Health

## 2017-08-11 DIAGNOSIS — N186 End stage renal disease: Secondary | ICD-10-CM | POA: Diagnosis not present

## 2017-08-13 DIAGNOSIS — N186 End stage renal disease: Secondary | ICD-10-CM | POA: Diagnosis not present

## 2017-08-16 DIAGNOSIS — J449 Chronic obstructive pulmonary disease, unspecified: Secondary | ICD-10-CM | POA: Diagnosis not present

## 2017-08-16 DIAGNOSIS — D500 Iron deficiency anemia secondary to blood loss (chronic): Secondary | ICD-10-CM | POA: Diagnosis not present

## 2017-08-16 DIAGNOSIS — I5031 Acute diastolic (congestive) heart failure: Secondary | ICD-10-CM | POA: Diagnosis not present

## 2017-08-16 DIAGNOSIS — I2510 Atherosclerotic heart disease of native coronary artery without angina pectoris: Secondary | ICD-10-CM | POA: Diagnosis not present

## 2017-08-16 DIAGNOSIS — M6281 Muscle weakness (generalized): Secondary | ICD-10-CM | POA: Diagnosis not present

## 2017-08-16 DIAGNOSIS — N186 End stage renal disease: Secondary | ICD-10-CM | POA: Diagnosis not present

## 2017-08-16 DIAGNOSIS — R2681 Unsteadiness on feet: Secondary | ICD-10-CM | POA: Diagnosis not present

## 2017-08-16 DIAGNOSIS — Z992 Dependence on renal dialysis: Secondary | ICD-10-CM | POA: Diagnosis not present

## 2017-08-16 DIAGNOSIS — L0390 Cellulitis, unspecified: Secondary | ICD-10-CM | POA: Diagnosis not present

## 2017-08-16 DIAGNOSIS — K922 Gastrointestinal hemorrhage, unspecified: Secondary | ICD-10-CM | POA: Diagnosis not present

## 2017-08-17 NOTE — Progress Notes (Signed)
  Subjective:  Patient ID: Terry Garfinkel., male    DOB: 1934/08/22,  MRN: [id number]  Chief Complaint  Patient presents with  . Ingrown Toenail     right great toenail pain   83 y.o. male returns for the above complaint.  Reports right great toenail pain.  Reports that this thick discolored times 3 months.  Denies diabetes.  Objective:  There were no vitals filed for this visit. General AA&O x3. Normal mood and affect.  Vascular Pedal pulses palpable.  Neurologic Epicritic sensation grossly intact.  Dermatologic Right hallux nail thick discolored.  Slightly ingrown with some skin irritation  Orthopedic: No pain to palpation either foot.   Assessment & Plan:  Patient was evaluated and treated and all questions answered.  Onychomycosis -Nail palliatively debrided -Educated on soaking instructions -Follow-up as needed  No Follow-up on file.

## 2017-08-18 DIAGNOSIS — N186 End stage renal disease: Secondary | ICD-10-CM | POA: Diagnosis not present

## 2017-08-19 DIAGNOSIS — N186 End stage renal disease: Secondary | ICD-10-CM | POA: Diagnosis not present

## 2017-08-19 DIAGNOSIS — Z992 Dependence on renal dialysis: Secondary | ICD-10-CM | POA: Diagnosis not present

## 2017-08-20 DIAGNOSIS — N2581 Secondary hyperparathyroidism of renal origin: Secondary | ICD-10-CM | POA: Diagnosis not present

## 2017-08-20 DIAGNOSIS — E1122 Type 2 diabetes mellitus with diabetic chronic kidney disease: Secondary | ICD-10-CM | POA: Diagnosis not present

## 2017-08-20 DIAGNOSIS — Z992 Dependence on renal dialysis: Secondary | ICD-10-CM | POA: Diagnosis not present

## 2017-08-20 DIAGNOSIS — T82838A Hemorrhage of vascular prosthetic devices, implants and grafts, initial encounter: Secondary | ICD-10-CM | POA: Diagnosis not present

## 2017-08-20 DIAGNOSIS — I770 Arteriovenous fistula, acquired: Secondary | ICD-10-CM | POA: Diagnosis not present

## 2017-08-20 DIAGNOSIS — N186 End stage renal disease: Secondary | ICD-10-CM | POA: Diagnosis not present

## 2017-08-23 DIAGNOSIS — N2581 Secondary hyperparathyroidism of renal origin: Secondary | ICD-10-CM | POA: Diagnosis not present

## 2017-08-23 DIAGNOSIS — N186 End stage renal disease: Secondary | ICD-10-CM | POA: Diagnosis not present

## 2017-08-25 DIAGNOSIS — N2581 Secondary hyperparathyroidism of renal origin: Secondary | ICD-10-CM | POA: Diagnosis not present

## 2017-08-25 DIAGNOSIS — N186 End stage renal disease: Secondary | ICD-10-CM | POA: Diagnosis not present

## 2017-08-27 DIAGNOSIS — N186 End stage renal disease: Secondary | ICD-10-CM | POA: Diagnosis not present

## 2017-08-27 DIAGNOSIS — N2581 Secondary hyperparathyroidism of renal origin: Secondary | ICD-10-CM | POA: Diagnosis not present

## 2017-08-29 DIAGNOSIS — R0600 Dyspnea, unspecified: Secondary | ICD-10-CM | POA: Diagnosis not present

## 2017-08-29 DIAGNOSIS — I120 Hypertensive chronic kidney disease with stage 5 chronic kidney disease or end stage renal disease: Secondary | ICD-10-CM | POA: Diagnosis not present

## 2017-08-29 DIAGNOSIS — I25110 Atherosclerotic heart disease of native coronary artery with unstable angina pectoris: Secondary | ICD-10-CM | POA: Diagnosis not present

## 2017-08-29 DIAGNOSIS — R05 Cough: Secondary | ICD-10-CM | POA: Diagnosis not present

## 2017-08-29 DIAGNOSIS — J449 Chronic obstructive pulmonary disease, unspecified: Secondary | ICD-10-CM | POA: Diagnosis not present

## 2017-08-29 DIAGNOSIS — R9431 Abnormal electrocardiogram [ECG] [EKG]: Secondary | ICD-10-CM | POA: Diagnosis not present

## 2017-08-29 DIAGNOSIS — D649 Anemia, unspecified: Secondary | ICD-10-CM | POA: Diagnosis not present

## 2017-08-29 DIAGNOSIS — N186 End stage renal disease: Secondary | ICD-10-CM | POA: Diagnosis not present

## 2017-08-29 DIAGNOSIS — I509 Heart failure, unspecified: Secondary | ICD-10-CM | POA: Diagnosis not present

## 2017-08-29 DIAGNOSIS — R079 Chest pain, unspecified: Secondary | ICD-10-CM | POA: Diagnosis not present

## 2017-08-29 DIAGNOSIS — K219 Gastro-esophageal reflux disease without esophagitis: Secondary | ICD-10-CM | POA: Diagnosis not present

## 2017-08-29 DIAGNOSIS — M79661 Pain in right lower leg: Secondary | ICD-10-CM | POA: Diagnosis not present

## 2017-08-29 DIAGNOSIS — T82855A Stenosis of coronary artery stent, initial encounter: Secondary | ICD-10-CM | POA: Diagnosis not present

## 2017-08-29 DIAGNOSIS — I2510 Atherosclerotic heart disease of native coronary artery without angina pectoris: Secondary | ICD-10-CM | POA: Diagnosis not present

## 2017-08-29 DIAGNOSIS — I482 Chronic atrial fibrillation: Secondary | ICD-10-CM | POA: Diagnosis not present

## 2017-08-29 DIAGNOSIS — J9620 Acute and chronic respiratory failure, unspecified whether with hypoxia or hypercapnia: Secondary | ICD-10-CM | POA: Diagnosis not present

## 2017-08-29 DIAGNOSIS — J9811 Atelectasis: Secondary | ICD-10-CM | POA: Diagnosis not present

## 2017-08-29 DIAGNOSIS — I2584 Coronary atherosclerosis due to calcified coronary lesion: Secondary | ICD-10-CM | POA: Diagnosis not present

## 2017-08-29 DIAGNOSIS — D631 Anemia in chronic kidney disease: Secondary | ICD-10-CM | POA: Diagnosis not present

## 2017-08-29 DIAGNOSIS — J189 Pneumonia, unspecified organism: Secondary | ICD-10-CM | POA: Diagnosis not present

## 2017-08-29 DIAGNOSIS — I743 Embolism and thrombosis of arteries of the lower extremities: Secondary | ICD-10-CM | POA: Diagnosis not present

## 2017-08-29 DIAGNOSIS — E46 Unspecified protein-calorie malnutrition: Secondary | ICD-10-CM | POA: Diagnosis not present

## 2017-08-29 DIAGNOSIS — I350 Nonrheumatic aortic (valve) stenosis: Secondary | ICD-10-CM | POA: Diagnosis not present

## 2017-08-29 DIAGNOSIS — K5730 Diverticulosis of large intestine without perforation or abscess without bleeding: Secondary | ICD-10-CM | POA: Diagnosis not present

## 2017-08-29 DIAGNOSIS — I5023 Acute on chronic systolic (congestive) heart failure: Secondary | ICD-10-CM | POA: Diagnosis not present

## 2017-08-29 DIAGNOSIS — Z9981 Dependence on supplemental oxygen: Secondary | ICD-10-CM | POA: Diagnosis not present

## 2017-08-29 DIAGNOSIS — R918 Other nonspecific abnormal finding of lung field: Secondary | ICD-10-CM | POA: Diagnosis not present

## 2017-08-29 DIAGNOSIS — R0602 Shortness of breath: Secondary | ICD-10-CM | POA: Diagnosis not present

## 2017-08-29 DIAGNOSIS — J811 Chronic pulmonary edema: Secondary | ICD-10-CM | POA: Diagnosis not present

## 2017-08-29 DIAGNOSIS — E871 Hypo-osmolality and hyponatremia: Secondary | ICD-10-CM | POA: Diagnosis not present

## 2017-08-29 DIAGNOSIS — Q613 Polycystic kidney, unspecified: Secondary | ICD-10-CM | POA: Diagnosis not present

## 2017-08-29 DIAGNOSIS — Z992 Dependence on renal dialysis: Secondary | ICD-10-CM | POA: Diagnosis not present

## 2017-08-29 DIAGNOSIS — E8779 Other fluid overload: Secondary | ICD-10-CM | POA: Diagnosis not present

## 2017-08-29 DIAGNOSIS — I132 Hypertensive heart and chronic kidney disease with heart failure and with stage 5 chronic kidney disease, or end stage renal disease: Secondary | ICD-10-CM | POA: Diagnosis not present

## 2017-08-29 DIAGNOSIS — R931 Abnormal findings on diagnostic imaging of heart and coronary circulation: Secondary | ICD-10-CM | POA: Diagnosis not present

## 2017-08-29 DIAGNOSIS — I4891 Unspecified atrial fibrillation: Secondary | ICD-10-CM | POA: Diagnosis not present

## 2017-08-30 ENCOUNTER — Telehealth: Payer: Self-pay | Admitting: Cardiovascular Disease

## 2017-08-30 NOTE — Telephone Encounter (Signed)
Mrs. Terry [name] is calling to find out if there is any overriding reason to why [name] shouldn't have a heart cath . He is currently in the hospital in [facility] . Please call

## 2017-08-30 NOTE — Telephone Encounter (Signed)
Spoke with patient's spouse. She states patient is in hospital for sob and pulmonary edema. She states MD is considering doing heart cath. She would like to know if there are any contraindications to the patient having the procedure. Informed her that since patient has been admitted and currently under the care of the MD at the hospital that she should consider their  recommendations due to patient's symptoms. Informed her that I would send to Dr. [name] for review. She verbalized understanding and thankful for the call.

## 2017-08-31 ENCOUNTER — Ambulatory Visit: Payer: Medicare PPO | Admitting: Cardiology

## 2017-08-31 NOTE — Telephone Encounter (Signed)
I agree that it would be reasonable to do a heart cath if his doctors there think that is indicated. Thanks, chris

## 2017-08-31 NOTE — Telephone Encounter (Signed)
Pt spouse is aware of Dr. Camillia Herter recommendations. She verbalized understanding.

## 2017-09-02 ENCOUNTER — Encounter: Payer: Medicare PPO | Admitting: Adult Health

## 2017-09-06 DIAGNOSIS — J438 Other emphysema: Secondary | ICD-10-CM | POA: Diagnosis not present

## 2017-09-06 DIAGNOSIS — J9811 Atelectasis: Secondary | ICD-10-CM | POA: Diagnosis not present

## 2017-09-06 DIAGNOSIS — N186 End stage renal disease: Secondary | ICD-10-CM | POA: Diagnosis not present

## 2017-09-06 DIAGNOSIS — T82855A Stenosis of coronary artery stent, initial encounter: Secondary | ICD-10-CM | POA: Diagnosis not present

## 2017-09-06 DIAGNOSIS — I2510 Atherosclerotic heart disease of native coronary artery without angina pectoris: Secondary | ICD-10-CM | POA: Diagnosis not present

## 2017-09-06 DIAGNOSIS — Z9981 Dependence on supplemental oxygen: Secondary | ICD-10-CM | POA: Diagnosis not present

## 2017-09-06 DIAGNOSIS — I25110 Atherosclerotic heart disease of native coronary artery with unstable angina pectoris: Secondary | ICD-10-CM | POA: Diagnosis not present

## 2017-09-06 DIAGNOSIS — I25118 Atherosclerotic heart disease of native coronary artery with other forms of angina pectoris: Secondary | ICD-10-CM | POA: Diagnosis not present

## 2017-09-06 DIAGNOSIS — I132 Hypertensive heart and chronic kidney disease with heart failure and with stage 5 chronic kidney disease, or end stage renal disease: Secondary | ICD-10-CM | POA: Diagnosis not present

## 2017-09-06 DIAGNOSIS — D631 Anemia in chronic kidney disease: Secondary | ICD-10-CM | POA: Diagnosis not present

## 2017-09-06 DIAGNOSIS — E46 Unspecified protein-calorie malnutrition: Secondary | ICD-10-CM | POA: Diagnosis not present

## 2017-09-06 DIAGNOSIS — J432 Centrilobular emphysema: Secondary | ICD-10-CM | POA: Diagnosis not present

## 2017-09-06 DIAGNOSIS — I10 Essential (primary) hypertension: Secondary | ICD-10-CM | POA: Diagnosis not present

## 2017-09-06 DIAGNOSIS — J90 Pleural effusion, not elsewhere classified: Secondary | ICD-10-CM | POA: Diagnosis not present

## 2017-09-06 DIAGNOSIS — I708 Atherosclerosis of other arteries: Secondary | ICD-10-CM | POA: Diagnosis not present

## 2017-09-06 DIAGNOSIS — J9809 Other diseases of bronchus, not elsewhere classified: Secondary | ICD-10-CM | POA: Diagnosis not present

## 2017-09-06 DIAGNOSIS — I5023 Acute on chronic systolic (congestive) heart failure: Secondary | ICD-10-CM | POA: Diagnosis not present

## 2017-09-06 DIAGNOSIS — Z992 Dependence on renal dialysis: Secondary | ICD-10-CM | POA: Diagnosis not present

## 2017-09-06 DIAGNOSIS — I350 Nonrheumatic aortic (valve) stenosis: Secondary | ICD-10-CM | POA: Diagnosis not present

## 2017-09-13 DIAGNOSIS — N186 End stage renal disease: Secondary | ICD-10-CM | POA: Diagnosis not present

## 2017-09-13 DIAGNOSIS — R2681 Unsteadiness on feet: Secondary | ICD-10-CM | POA: Diagnosis not present

## 2017-09-13 DIAGNOSIS — M6281 Muscle weakness (generalized): Secondary | ICD-10-CM | POA: Diagnosis not present

## 2017-09-13 DIAGNOSIS — J449 Chronic obstructive pulmonary disease, unspecified: Secondary | ICD-10-CM | POA: Diagnosis not present

## 2017-09-13 DIAGNOSIS — K922 Gastrointestinal hemorrhage, unspecified: Secondary | ICD-10-CM | POA: Diagnosis not present

## 2017-09-13 DIAGNOSIS — I2510 Atherosclerotic heart disease of native coronary artery without angina pectoris: Secondary | ICD-10-CM | POA: Diagnosis not present

## 2017-09-13 DIAGNOSIS — L0390 Cellulitis, unspecified: Secondary | ICD-10-CM | POA: Diagnosis not present

## 2017-09-13 DIAGNOSIS — I5031 Acute diastolic (congestive) heart failure: Secondary | ICD-10-CM | POA: Diagnosis not present

## 2017-09-13 DIAGNOSIS — N2581 Secondary hyperparathyroidism of renal origin: Secondary | ICD-10-CM | POA: Diagnosis not present

## 2017-09-13 DIAGNOSIS — D500 Iron deficiency anemia secondary to blood loss (chronic): Secondary | ICD-10-CM | POA: Diagnosis not present

## 2017-09-13 DIAGNOSIS — Z992 Dependence on renal dialysis: Secondary | ICD-10-CM | POA: Diagnosis not present

## 2017-09-15 DIAGNOSIS — N186 End stage renal disease: Secondary | ICD-10-CM | POA: Diagnosis not present

## 2017-09-15 DIAGNOSIS — N2581 Secondary hyperparathyroidism of renal origin: Secondary | ICD-10-CM | POA: Diagnosis not present

## 2017-09-17 DIAGNOSIS — N186 End stage renal disease: Secondary | ICD-10-CM | POA: Diagnosis not present

## 2017-09-17 DIAGNOSIS — N2581 Secondary hyperparathyroidism of renal origin: Secondary | ICD-10-CM | POA: Diagnosis not present

## 2017-09-19 DIAGNOSIS — N186 End stage renal disease: Secondary | ICD-10-CM | POA: Diagnosis not present

## 2017-09-19 DIAGNOSIS — Z992 Dependence on renal dialysis: Secondary | ICD-10-CM | POA: Diagnosis not present

## 2017-09-20 DIAGNOSIS — N2581 Secondary hyperparathyroidism of renal origin: Secondary | ICD-10-CM | POA: Diagnosis not present

## 2017-09-20 DIAGNOSIS — Z992 Dependence on renal dialysis: Secondary | ICD-10-CM | POA: Diagnosis not present

## 2017-09-20 DIAGNOSIS — N186 End stage renal disease: Secondary | ICD-10-CM | POA: Diagnosis not present

## 2017-09-20 DIAGNOSIS — E1122 Type 2 diabetes mellitus with diabetic chronic kidney disease: Secondary | ICD-10-CM | POA: Diagnosis not present

## 2017-09-22 ENCOUNTER — Telehealth: Payer: Self-pay | Admitting: *Deleted

## 2017-09-22 DIAGNOSIS — N186 End stage renal disease: Secondary | ICD-10-CM | POA: Diagnosis not present

## 2017-09-22 DIAGNOSIS — N2581 Secondary hyperparathyroidism of renal origin: Secondary | ICD-10-CM | POA: Diagnosis not present

## 2017-09-22 NOTE — Telephone Encounter (Signed)
Received message from [facility] that pt had been referred to them following hospitalization in [place]. Pt not candidate for cardiac rehab due to Aortic stenosis.  He has been seen in [place] for possible TAVR. Dr. [name] requested I contact pt to schedule follow up in our office. I spoke with pt's wife to see if pt is planning on following up here or in [place]. Wife reports they are waiting to here about appt in [place]. She reports pt's daughter lives there and they be moving there.  I made pt's wife aware that Dr. [name] did TAVR procedure also. Wife will contact me back after they decide where pt would like to follow up.

## 2017-09-24 DIAGNOSIS — N2581 Secondary hyperparathyroidism of renal origin: Secondary | ICD-10-CM | POA: Diagnosis not present

## 2017-09-24 DIAGNOSIS — N186 End stage renal disease: Secondary | ICD-10-CM | POA: Diagnosis not present

## 2017-09-27 DIAGNOSIS — N186 End stage renal disease: Secondary | ICD-10-CM | POA: Diagnosis not present

## 2017-09-27 DIAGNOSIS — N2581 Secondary hyperparathyroidism of renal origin: Secondary | ICD-10-CM | POA: Diagnosis not present

## 2017-09-28 ENCOUNTER — Ambulatory Visit: Payer: Medicare PPO | Admitting: Podiatry

## 2017-09-28 ENCOUNTER — Encounter: Payer: Self-pay | Admitting: Podiatry

## 2017-09-28 ENCOUNTER — Other Ambulatory Visit: Payer: Self-pay | Admitting: Podiatry

## 2017-09-28 ENCOUNTER — Ambulatory Visit (INDEPENDENT_AMBULATORY_CARE_PROVIDER_SITE_OTHER): Payer: Medicare PPO

## 2017-09-28 VITALS — BP 132/70 | HR 53

## 2017-09-28 DIAGNOSIS — M722 Plantar fascial fibromatosis: Secondary | ICD-10-CM

## 2017-09-28 DIAGNOSIS — L97421 Non-pressure chronic ulcer of left heel and midfoot limited to breakdown of skin: Secondary | ICD-10-CM

## 2017-09-28 DIAGNOSIS — L03032 Cellulitis of left toe: Secondary | ICD-10-CM

## 2017-09-28 DIAGNOSIS — L02612 Cutaneous abscess of left foot: Secondary | ICD-10-CM

## 2017-09-28 NOTE — Progress Notes (Signed)
This patient presents the office stating he's having severe pain in the back of his left heel.  He says that he is having sharp, radiating pain noted at the back of the left heel.  [name] His wife states that he has been having medical treatment for his heart which has led to  Hospitalization.  . This patient was previously seen by myself and the left nail on the big toe was permanently removed.  He also says the second toe on the left foot has become  painful walking and wearing his shoes.  [name] He presents the office today for an evaluation of his painful left heel, as well as the painful nail second toe left.  General Appearance  Alert, conversant and in no acute stress.  Vascular  Dorsalis pedis and posterior tibial  pulses are palpable  bilaterally.  Capillary return is within normal limits  bilaterally. Temperature is within normal limits  bilaterally.  Neurologic  Senn-Weinstein monofilament wire test within normal limits  bilaterally. Muscle power within normal limits bilaterally.  Nails Thick disfigured discolored nails with subungual debris  from hallux to fifth toes bilaterally.  , subungual a under second digit left foot. There is significant abscess formation.    Orthopedic  No limitations of motion of motion feet .  No crepitus or effusions noted.  No bony pathology or digital deformities noted.  Skin  normotropic skin with no porokeratosis noted bilaterally.  . There is a breakdown of skin on the posterior aspect of the left heel.  No evidence of redness or drainage noted.   There is a painful fissure upon palpation left heel.  Preulcerous callus/ulcer left feel.   Paronychia/abscess subungually second toenail left.  ROV.  [name] X-rays reveal no evidence of bony pathology.  Incision and drainage of an abscess subungually the second toe left foot.  Neosporin and dry sterile dressing was applied.  Debridement of the callus/ulcer left heel.  Neosporin and dry sterile dressing was applied.  Patient  was given home instructions to soak these 2 areas until healing occurs.  No antibiotics were called for this patient, since this appeared to be a dry abscess with no involvement of the tissue second toe left foot.  Heel cushion was prescribed for this patient to be worn at home.  I feel that he'll was aggravated due to his medical condition for his heart surgery.  He was instructed to wear the heel cushions at home and during sleep.  Return to the clinic in 3 months for nail care.   Gardiner Barefoot DPM

## 2017-09-29 DIAGNOSIS — N186 End stage renal disease: Secondary | ICD-10-CM | POA: Diagnosis not present

## 2017-09-29 DIAGNOSIS — N2581 Secondary hyperparathyroidism of renal origin: Secondary | ICD-10-CM | POA: Diagnosis not present

## 2017-09-30 ENCOUNTER — Ambulatory Visit (HOSPITAL_COMMUNITY)
Admission: RE | Admit: 2017-09-30 | Discharge: 2017-09-30 | Disposition: A | Discharge status: Home / Self Care | Payer: Medicare PPO | Source: Ambulatory Visit | Attending: Vascular Surgery | Admitting: Vascular Surgery

## 2017-09-30 ENCOUNTER — Other Ambulatory Visit (HOSPITAL_COMMUNITY): Payer: Self-pay | Admitting: Nephrology

## 2017-09-30 DIAGNOSIS — M7989 Other specified soft tissue disorders: Secondary | ICD-10-CM | POA: Insufficient documentation

## 2017-09-30 DIAGNOSIS — I70201 Unspecified atherosclerosis of native arteries of extremities, right leg: Secondary | ICD-10-CM | POA: Diagnosis not present

## 2017-09-30 DIAGNOSIS — Z955 Presence of coronary angioplasty implant and graft: Secondary | ICD-10-CM | POA: Diagnosis not present

## 2017-09-30 DIAGNOSIS — M79604 Pain in right leg: Secondary | ICD-10-CM | POA: Diagnosis not present

## 2017-10-01 DIAGNOSIS — N2581 Secondary hyperparathyroidism of renal origin: Secondary | ICD-10-CM | POA: Diagnosis not present

## 2017-10-01 DIAGNOSIS — N186 End stage renal disease: Secondary | ICD-10-CM | POA: Diagnosis not present

## 2017-10-02 DIAGNOSIS — J449 Chronic obstructive pulmonary disease, unspecified: Secondary | ICD-10-CM | POA: Diagnosis not present

## 2017-10-04 DIAGNOSIS — N2581 Secondary hyperparathyroidism of renal origin: Secondary | ICD-10-CM | POA: Diagnosis not present

## 2017-10-04 DIAGNOSIS — N186 End stage renal disease: Secondary | ICD-10-CM | POA: Diagnosis not present

## 2017-10-06 ENCOUNTER — Other Ambulatory Visit: Payer: Self-pay

## 2017-10-06 DIAGNOSIS — N2581 Secondary hyperparathyroidism of renal origin: Secondary | ICD-10-CM | POA: Diagnosis not present

## 2017-10-06 DIAGNOSIS — N186 End stage renal disease: Secondary | ICD-10-CM | POA: Diagnosis not present

## 2017-10-06 DIAGNOSIS — I739 Peripheral vascular disease, unspecified: Secondary | ICD-10-CM

## 2017-10-08 DIAGNOSIS — N186 End stage renal disease: Secondary | ICD-10-CM | POA: Diagnosis not present

## 2017-10-08 DIAGNOSIS — N2581 Secondary hyperparathyroidism of renal origin: Secondary | ICD-10-CM | POA: Diagnosis not present

## 2017-10-11 DIAGNOSIS — N186 End stage renal disease: Secondary | ICD-10-CM | POA: Diagnosis not present

## 2017-10-11 DIAGNOSIS — N2581 Secondary hyperparathyroidism of renal origin: Secondary | ICD-10-CM | POA: Diagnosis not present

## 2017-10-13 ENCOUNTER — Telehealth: Payer: Self-pay | Admitting: Podiatry

## 2017-10-13 DIAGNOSIS — N186 End stage renal disease: Secondary | ICD-10-CM | POA: Diagnosis not present

## 2017-10-13 DIAGNOSIS — N2581 Secondary hyperparathyroidism of renal origin: Secondary | ICD-10-CM | POA: Diagnosis not present

## 2017-10-13 NOTE — Telephone Encounter (Signed)
I informed pt's wife, Thayer Headings that I felt pt should be seen sooner than the 11/30/2017 appt, she states pt is at dialysis today, but would be able to come in tomorrow. I told her that would be fine and transferred to schedulers.

## 2017-10-13 NOTE — Telephone Encounter (Signed)
I'm calling for my husband who was seen week before last and has an ulcer. It doesn't seem to be getting that much better. Terry [name] cannot stand to soak his ankle in water as it hurts him to do that. Also, he has that place on the bottom of his foot as well. I wanted to call back as Dr. Prudence Davidson said to call back if we continue to have problems. Please call me back at my number of [phone] as Terry [name] will be in dialysis today and unable to take calls. Thank you so much. Have a good day. Bye bye.

## 2017-10-14 ENCOUNTER — Telehealth: Payer: Self-pay | Admitting: Podiatry

## 2017-10-14 DIAGNOSIS — I2510 Atherosclerotic heart disease of native coronary artery without angina pectoris: Secondary | ICD-10-CM | POA: Diagnosis not present

## 2017-10-14 DIAGNOSIS — M6281 Muscle weakness (generalized): Secondary | ICD-10-CM | POA: Diagnosis not present

## 2017-10-14 DIAGNOSIS — J449 Chronic obstructive pulmonary disease, unspecified: Secondary | ICD-10-CM | POA: Diagnosis not present

## 2017-10-14 DIAGNOSIS — D500 Iron deficiency anemia secondary to blood loss (chronic): Secondary | ICD-10-CM | POA: Diagnosis not present

## 2017-10-14 DIAGNOSIS — R2681 Unsteadiness on feet: Secondary | ICD-10-CM | POA: Diagnosis not present

## 2017-10-14 DIAGNOSIS — Z992 Dependence on renal dialysis: Secondary | ICD-10-CM | POA: Diagnosis not present

## 2017-10-14 DIAGNOSIS — K922 Gastrointestinal hemorrhage, unspecified: Secondary | ICD-10-CM | POA: Diagnosis not present

## 2017-10-14 DIAGNOSIS — L0390 Cellulitis, unspecified: Secondary | ICD-10-CM | POA: Diagnosis not present

## 2017-10-14 DIAGNOSIS — I5031 Acute diastolic (congestive) heart failure: Secondary | ICD-10-CM | POA: Diagnosis not present

## 2017-10-14 NOTE — Telephone Encounter (Signed)
I spoke with pt's wife, Thayer Headings, she states pt is to have teeth pulled Thursday of next week for dentures, and Tuesday will have final wax impressions for the dentures, and also had dialysis, so she was not certain how or when she could get pt in. I told Thayer Headings I felt it pt needed to be seen prior to the dental surgery to be as certain as possible he does not have infection. I told Thayer Headings I would send to schedulers and explain pt's situation and get an appt that would work with pt's schedule. Transferred Thayer Headings to scheduler.

## 2017-10-14 NOTE — Telephone Encounter (Signed)
Patients wife called a few days ago and wanted to get her husband in to be seen- he has a ulcer on his foot and cant stand to soak it- hurts him too much- I read the message from the nurse to get him in today or tomorrow- told patient Terry [name] is in the [place] location today and in [place] tomorrow- offered both locations to her pts wife stated she couldn't do either- she asked for next week I offered Wednesday afternoon but she couldn't do that either so now she would like to speak to the nurse for further assistance. If you can call her back at [phone]

## 2017-10-15 DIAGNOSIS — N2581 Secondary hyperparathyroidism of renal origin: Secondary | ICD-10-CM | POA: Diagnosis not present

## 2017-10-15 DIAGNOSIS — N186 End stage renal disease: Secondary | ICD-10-CM | POA: Diagnosis not present

## 2017-10-18 DIAGNOSIS — N186 End stage renal disease: Secondary | ICD-10-CM | POA: Diagnosis not present

## 2017-10-18 DIAGNOSIS — N2581 Secondary hyperparathyroidism of renal origin: Secondary | ICD-10-CM | POA: Diagnosis not present

## 2017-10-19 ENCOUNTER — Ambulatory Visit: Payer: Medicare PPO | Admitting: Podiatry

## 2017-10-19 ENCOUNTER — Encounter: Payer: Self-pay | Admitting: Podiatry

## 2017-10-19 DIAGNOSIS — L97421 Non-pressure chronic ulcer of left heel and midfoot limited to breakdown of skin: Secondary | ICD-10-CM

## 2017-10-19 MED ORDER — AMOXICILLIN 500 MG PO CAPS: 500.0000 mg | ORAL_CAPSULE | Freq: Three times a day (TID) | ORAL | 0 refills | Status: DC

## 2017-10-19 NOTE — Progress Notes (Signed)
His patient presents to the office follow-up for continued pain noted in the back of his left heel.  He states that his heel continues to be painful and has not he healed as of today.  He states that he having difficulty soaking his foot due to the pain and burning placing his foot into the soaks. He  says he has been using the heel cushion, which has helped him sleep better. This patient was reminded that at his last visit. He was unable to bear weight and had to  use a  Wheelchair in  this office.  His left foot has improved but she still is concerned about the level of pain in his left foot.  He says he is not having any pain or discomfort or drainage from the second toe left foot, which was found to have a dry abscess  last visit.  He presents the office today for an evaluation and treatment of his painful left foot  General Appearance  Alert, conversant and in no acute stress.  Vascular  Dorsalis pedis and posterior tibial  pulses are palpable  bilaterally.  Capillary return is within normal limits  bilaterally. Temperature is within normal limits  bilaterally.  Neurologic  Senn-Weinstein monofilament wire test within normal limits  bilaterally. Muscle power within normal limits bilaterally.  Nails Thick disfigured discolored nails with subungual debris  from hallux to fifth toes bilaterally. No evidence of bacterial infection or drainage bilaterally. Dried subungual abscess healing second toe left foot.  Orthopedic  No limitations of motion of motion feet .  No crepitus or effusions noted.  No bony pathology or digital deformities noted. Patient has palpable pain from the medial and lateral aspect of the heel bone. This pain is out of proportion to what is seen clinically.  Skin  normotropic skin with no porokeratosis noted bilaterally. His  posterior aspect of the left heel reveals thin, red, shiny skin retrocalcaneally.  There is a small sized break in the skin at the posterior lateral aspect of  the left calcaneus.scant drainage is noted from this ulcer left heel. Sensory skin noted at the posterior aspect of the left heel.  There is no evidence of any swelling or increased temperature noted at this site.  Ulcer left heel.    ROV.  This patient was initially scheduled at 4:30 PM.  He was given an appointment to 3:30 PM.  He  showed up for his appointment before his scheduled appointment.  He was signed in by [place].  She said that he was unhappy and complained about her not knowing that he had an earlier appointment.  He commented this is the third time that he has been experienced problems with his scheduled appointments.  Ailene Ravel came back to check with me and I told her that we will work him in.  When he was brought back to the treatment room. I noted there was no limp or antalgic gait.  I proceeded to examine his left heel and I felt it had improved  based on his previous visit. I reviewed his x-rays from his his previous visit and it revealed no significant bony pathology.  I then asked Dr. Jacqualyn Posey to check this patient with me.  He felt there was still inflammation in the heel   .  He suggested that we should dispense a Cam Walker to help immobilize the Achilles tendon in  the left leg.  He also suggested placing him on amoxicillin 500 mg since he felt  the redness  Indicated a possible   infection.  We then placed a Cam Walker on the lower leg of this patient.  As he  started walking with the cam walker, he said his left foot  was still painful and he was unstable and appeared to be falling.  He therefore said he did not want the Cam Walker.  A bandage of Neosporin and dry sterile dressing with plenty of padding was applied to his left foot.[name]  He did want to receive the amoxicillin for possible infection to the foot.  Upon calling in the antibiotic it was not that he was allergic to amoxicillin.  I discussed this with his wife and she said that was incorrect and that he can take amoxicillin.  I  also suggested he soak his foot in just water.  She mentioned that he is scheduled for multiple surgeries and did not make a follow-up appointment since she did not know when he would be able to keep the appointment.  RTC prn   Gardiner Barefoot DPM

## 2017-10-20 DIAGNOSIS — N186 End stage renal disease: Secondary | ICD-10-CM | POA: Diagnosis not present

## 2017-10-20 DIAGNOSIS — N2581 Secondary hyperparathyroidism of renal origin: Secondary | ICD-10-CM | POA: Diagnosis not present

## 2017-10-23 DIAGNOSIS — N186 End stage renal disease: Secondary | ICD-10-CM | POA: Diagnosis not present

## 2017-10-25 DIAGNOSIS — I120 Hypertensive chronic kidney disease with stage 5 chronic kidney disease or end stage renal disease: Secondary | ICD-10-CM | POA: Diagnosis not present

## 2017-10-25 DIAGNOSIS — I350 Nonrheumatic aortic (valve) stenosis: Secondary | ICD-10-CM | POA: Diagnosis not present

## 2017-10-25 DIAGNOSIS — I5020 Unspecified systolic (congestive) heart failure: Secondary | ICD-10-CM | POA: Diagnosis not present

## 2017-10-25 DIAGNOSIS — N186 End stage renal disease: Secondary | ICD-10-CM | POA: Diagnosis not present

## 2017-10-25 DIAGNOSIS — Z9981 Dependence on supplemental oxygen: Secondary | ICD-10-CM | POA: Diagnosis not present

## 2017-10-25 DIAGNOSIS — Z992 Dependence on renal dialysis: Secondary | ICD-10-CM | POA: Diagnosis not present

## 2017-10-25 DIAGNOSIS — Z7952 Long term (current) use of systemic steroids: Secondary | ICD-10-CM | POA: Diagnosis not present

## 2017-10-25 DIAGNOSIS — Z8673 Personal history of transient ischemic attack (TIA), and cerebral infarction without residual deficits: Secondary | ICD-10-CM | POA: Diagnosis not present

## 2017-10-25 DIAGNOSIS — Z7982 Long term (current) use of aspirin: Secondary | ICD-10-CM | POA: Diagnosis not present

## 2017-10-26 DIAGNOSIS — N186 End stage renal disease: Secondary | ICD-10-CM | POA: Diagnosis not present

## 2017-10-27 DIAGNOSIS — N186 End stage renal disease: Secondary | ICD-10-CM | POA: Diagnosis not present

## 2017-10-27 DIAGNOSIS — N2581 Secondary hyperparathyroidism of renal origin: Secondary | ICD-10-CM | POA: Diagnosis not present

## 2017-10-29 DIAGNOSIS — N2581 Secondary hyperparathyroidism of renal origin: Secondary | ICD-10-CM | POA: Diagnosis not present

## 2017-10-29 DIAGNOSIS — N186 End stage renal disease: Secondary | ICD-10-CM | POA: Diagnosis not present

## 2017-11-01 DIAGNOSIS — N186 End stage renal disease: Secondary | ICD-10-CM | POA: Diagnosis not present

## 2017-11-01 DIAGNOSIS — N2581 Secondary hyperparathyroidism of renal origin: Secondary | ICD-10-CM | POA: Diagnosis not present

## 2017-11-01 DIAGNOSIS — J449 Chronic obstructive pulmonary disease, unspecified: Secondary | ICD-10-CM | POA: Diagnosis not present

## 2017-11-02 ENCOUNTER — Telehealth: Payer: Self-pay | Admitting: Cardiovascular Disease

## 2017-11-03 ENCOUNTER — Telehealth: Payer: Self-pay | Admitting: Cardiovascular Disease

## 2017-11-03 DIAGNOSIS — N186 End stage renal disease: Secondary | ICD-10-CM | POA: Diagnosis not present

## 2017-11-03 DIAGNOSIS — N2581 Secondary hyperparathyroidism of renal origin: Secondary | ICD-10-CM | POA: Diagnosis not present

## 2017-11-03 NOTE — Telephone Encounter (Signed)
I spoke with pt's wife. She does not know who scheduled pt to see Dr. [name] on 11/08/17.  She thinks it may have been Dr. Sanda Klein office or [facility].  Pt cannot be here on 11/08/17 because of his dialysis schedule.  Needs appointment on Tuesday or Thursday or early AM Monday, Wednesday, Friday. Pt's wife reports Dr. Lorrene Reid would like pt seen by cardiology.  Pt has recently been following at [facility] after stent placement and for TAVR evaluation.  Wife would like pt to be seen in our office as they feel [facility] is too far away if something were to happen to pt.  Wife reports TAVR is on hold due to "blockages in blood vessels between groin and heart." I scheduled pt to see Richardson Dopp, PA on 11/09/17 at 11:45.  Will try and get most recent records from [facility].

## 2017-11-03 NOTE — Telephone Encounter (Signed)
New Message:    Please call,she wants to talk to you about pt's appointment on 11-08-17.

## 2017-11-04 DIAGNOSIS — R040 Epistaxis: Secondary | ICD-10-CM | POA: Diagnosis not present

## 2017-11-05 DIAGNOSIS — N186 End stage renal disease: Secondary | ICD-10-CM | POA: Diagnosis not present

## 2017-11-05 DIAGNOSIS — N2581 Secondary hyperparathyroidism of renal origin: Secondary | ICD-10-CM | POA: Diagnosis not present

## 2017-11-08 ENCOUNTER — Ambulatory Visit (INDEPENDENT_AMBULATORY_CARE_PROVIDER_SITE_OTHER): Payer: Medicare PPO | Admitting: Cardiovascular Disease

## 2017-11-08 ENCOUNTER — Encounter: Payer: Self-pay | Admitting: Cardiovascular Disease

## 2017-11-08 ENCOUNTER — Ambulatory Visit: Payer: Medicare PPO | Admitting: Cardiovascular Disease

## 2017-11-08 VITALS — BP 124/50 | HR 90 | Ht 68.0 in | Wt 155.0 lb

## 2017-11-08 DIAGNOSIS — E7800 Pure hypercholesterolemia, unspecified: Secondary | ICD-10-CM | POA: Diagnosis not present

## 2017-11-08 DIAGNOSIS — I480 Paroxysmal atrial fibrillation: Secondary | ICD-10-CM

## 2017-11-08 DIAGNOSIS — I10 Essential (primary) hypertension: Secondary | ICD-10-CM

## 2017-11-08 DIAGNOSIS — I350 Nonrheumatic aortic (valve) stenosis: Secondary | ICD-10-CM

## 2017-11-08 DIAGNOSIS — I2510 Atherosclerotic heart disease of native coronary artery without angina pectoris: Secondary | ICD-10-CM

## 2017-11-08 NOTE — Patient Instructions (Signed)
Medication Instructions:  Your physician recommends that you continue on your current medications as directed. Please refer to the Current Medication list given to you today.   Labwork: none  Testing/Procedures: Your physician has requested that you have an echocardiogram. Echocardiography is a painless test that uses sound waves to create images of your heart. It provides your doctor with information about the size and shape of your heart and how well your heart's chambers and valves are working. This procedure takes approximately one hour. There are no restrictions for this procedure.  To be done in September  Follow-Up: Your physician recommends that you schedule a follow-up appointment in: October.  Please call our office in about 2 months to schedule this appointment    Any Other Special Instructions Will Be Listed Below (If Applicable).     If you need a refill on your cardiac medications before your next appointment, please call your pharmacy.

## 2017-11-08 NOTE — Progress Notes (Signed)
Chief Complaint  Patient presents with  . Follow-up    CAD   History of Present Illness: 83 yo male with history of PAF, HTN, HLD, CVA, COPD, CAD, ESRD on HD here today for cardiac follow up. He had been followed by cardiology in [place], [name] for many years until he moved to [place] in 2014. Cath records received from [facility] . Cardiac cath 11/30/1997. Mild distal Left main stenosis, 25% proximal LAD, minor irregularities Circumflex, 95% mid RCA stenosis treated with 3.5 x 32 mm bare metal stent. There was a ? Overlapping 3.5 x 18 mm bare metal stent more proximally. Echo 02/03/13 with LVEF=50-55%. He had normal carotid dopplers in [place] 2013. He was admitted to [facility] October 2015 with ruptured kidney cyst. Atrial fibrillation diagnosed in December 2015 at an office visit. He was initially started on Eliquis but progressed to ESRD so this was stopped. He was started on coumadin but had GI blood loss and severe bruising so this was stopped. He has been on low dose beta blocker therapy. He had been started on amiodarone but this was stopped. Low risk stress myoview September 2016.  He was admitted to [facility] in March 2019 with SOB. He was in atrial fib and treated for pneumonia and COPD exacerbation. Cardiac cath with severe RCA disease (70% ostial stenosis, 95% mid stenosis, 50% LAD proximal stenosis, Circumflex 40% mid stenosis. His vessels were all calcified. Echo March 2019 with LVEF=55%, severe AS with AVA 0.7 cm2, mean gradient 21 mmHg, mild LVH. He was transferred to [facility] and had rotational atherectomy of the ostial and mid RCA and placement of a 3.0 x 28 mm drug eluting stent in the mid vessel and a 3.5 x 24 mm drug eluting stent in the ostium of the RCA. Echo at [facility] with aortic valve area of 1.0 cm2 He was seen by the TAVR team and it was felt that TAVR was not indicated at this time as his AS was only moderate. Mean gradient 25 mmHg.  He is here today for follow  up. The patient denies any chest pain, dyspnea, palpitations, lower extremity edema, orthopnea, PND, dizziness, near syncope or syncope. He feels much better following his stents. He fell coming into our office in the parking deck and scuffed his knee. He did not get syncopal or dizzy.    Primary Care Physician: [name], DO  Past Medical History:  Diagnosis Date  . Acute and chronic respiratory failure 10/22/2012  . Anxiety   . Arthritis    "hands, knees" (03/15/2017)  . Atrial fibrillation ([place])   . CAD (coronary artery disease) [place], [name]  . CHF (congestive heart failure) ([place]) 09/2012  . COPD (chronic obstructive pulmonary disease) ([place])   . ESRD (end stage renal disease) on dialysis [facility])    "MWF; [facility], [place]" (03/15/2017)  . Flu 09/24/2016   influenza b  . HCAP (healthcare-associated pneumonia) 03/15/2017  . High cholesterol   . History of blood transfusion 2012   "related to bowel resection"  . Hypertension   . On home oxygen therapy    "1L typically at night" (03/15/2017)  . Osteoarthritis 10/22/2012  . Pneumonia April 2014; 09/2016  . Shortness of breath dyspnea   . Stroke [facility]) 2012   denies residual on 03/15/2017  . Stroke [facility]) ~ 2013   "small one in his right eye"    Past Surgical History:  Procedure Laterality Date  . APPENDECTOMY  [place] Right 05/21/2015   Procedure: Right Arm Brachiocephalic ARTERIOVENOUS (AV) FISTULA CREATION;  Surgeon: Angelia Mould, MD;  Location: [place];  Service: Vascular;  Laterality: Right;  . CATARACT EXTRACTION W/ INTRAOCULAR LENS  IMPLANT, BILATERAL Bilateral   . COLECTOMY  2012   partial colon removed - twisted bowel  . CORONARY ANGIOPLASTY WITH STENT PLACEMENT  1999   2 stents  . CYSTOSCOPY/RETROGRADE/URETEROSCOPY Bilateral 04/23/2014   Procedure: CYSTOSCOPY BILATERAL RETROGRADE,  LEFT URETEROSCOPY, LEFT STENT PLACEMENT;  Surgeon: Raynelle Bring, MD;  Location: [facility];   Service: Urology;  Laterality: Bilateral;  . EYE SURGERY Left    "surgery to clean off lens after cataract OR"  . INGUINAL HERNIA REPAIR Right     Current Outpatient Medications  Medication Sig Dispense Refill  . Aclidinium Bromide (TUDORZA PRESSAIR) 400 MCG/ACT AEPB Inhale 1 puff into the lungs 2 (two) times daily. 60 each 5  . albuterol (PROAIR HFA) 108 (90 Base) MCG/ACT inhaler 2 puffs every 4 hours as needed only  if your can't catch your breath 1 Inhaler 11  . albuterol (PROVENTIL) (2.5 MG/3ML) 0.083% nebulizer solution Take 3 mLs (2.5 mg total) by nebulization every 6 (six) hours as needed for wheezing or shortness of breath. 75 mL 2  . amoxicillin (AMOXIL) 500 MG capsule Take 1 capsule (500 mg total) by mouth 3 (three) times daily. 30 capsule 0  . aspirin EC 81 MG tablet Take 81 mg by mouth daily.    [name] atorvastatin (LIPITOR) 80 MG tablet Take 1 tablet by mouth daily.    [name] azithromycin (ZITHROMAX) 250 MG tablet Take 2 on day one then 1 daily x 4 days 6 tablet 11  . calcium acetate (PHOSLO) 667 MG capsule Take 667-1,334 mg by mouth 3 (three) times daily with meals.     . cholecalciferol (VITAMIN D) 1000 units tablet Take 1,000 Units by mouth daily.    . clopidogrel (PLAVIX) 75 MG tablet Take 75 mg by mouth daily.    [name] ethyl chloride spray Apply 1 application topically See admin instructions. For dialysis    . methocarbamol (ROBAXIN) 500 MG tablet Take 500 mg by mouth 2 (two) times daily as needed for muscle spasms.    . metoprolol succinate (TOPROL-XL) 50 MG 24 hr tablet Take 50 mg by mouth 2 (two) times daily. Take with or immediately following a meal.     . midodrine (PROAMATINE) 10 MG tablet Take 10 mg by mouth as needed. At dialysis for low pressure    . multivitamin (RENA-VIT) TABS tablet Take 1 tablet by mouth daily.    . OXYGEN Inhale 1 L into the lungs at bedtime.     . predniSONE (DELTASONE) 10 MG tablet Take 1 tablet (10 mg total) by mouth daily with breakfast. Take one tablet  my mouth daily. 30 tablet 0  . SYMBICORT 160-4.5 MCG/ACT inhaler INHALE 2 PUFFS TWICE DAILY 3 Inhaler 3  . SYMBICORT 160-4.5 MCG/ACT inhaler INHALE 2 PUFFS INTO LUNGS TWICE DAILY 1 Inhaler 11   No current facility-administered medications for this visit.     Allergies  Allergen Reactions  . Yellow Dyes (Non-Tartrazine) Other (See Comments)    Burns arms Cough medications (tussionex and benzonatate ok)  . Levaquin [Levofloxacin In D5w] Itching  . Levofloxacin Nausea And Vomiting    Social History   Socioeconomic History  . Marital status: Married    Spouse name: Not on file  . Number of children: 3  .  Years of education: Not on file  . Highest education level: Not on file  Occupational History  . Occupation: retired-worked in Writer: RETIRED  Social Needs  . Financial resource strain: Not on file  . Food insecurity:    Worry: Not on file    Inability: Not on file  . Transportation needs:    Medical: Not on file    Non-medical: Not on file  Tobacco Use  . Smoking status: Former Smoker    Packs/day: 1.00    Years: 49.00    Pack years: 49.00    Types: Cigarettes, Pipe, Cigars    Last attempt to quit: 02/21/2000    Years since quitting: 17.7  . Smokeless tobacco: Former Systems developer    Types: Chew  . Tobacco comment: "chewed when he was a kid"  Substance and Sexual Activity  . Alcohol use: Yes    Comment: 03/15/2017 "nothing since the 1960's"  . Drug use: No  . Sexual activity: Never  Lifestyle  . Physical activity:    Days per week: Not on file    Minutes per session: Not on file  . Stress: Not on file  Relationships  . Social connections:    Talks on phone: Not on file    Gets together: Not on file    Attends religious service: Not on file    Active member of club or organization: Not on file    Attends meetings of clubs or organizations: Not on file    Relationship status: Not on file  . Intimate partner violence:    Fear of current or ex partner: Not on  file    Emotionally abused: Not on file    Physically abused: Not on file    Forced sexual activity: Not on file  Other Topics Concern  . Not on file  Social History Narrative   Work or School: retired [employer] Situation: lives with wife in [place] Beliefs: [place] regular; poor diet          Family History  Problem Relation Age of Onset  . Cancer Mother        Breast  . Heart disease Father   . Heart attack Father   . Parkinson's disease Brother     Review of Systems:  As stated in the HPI and otherwise negative.   BP (!) 124/50   Pulse 90   Ht 5\' 8"  (1.727 m)   Wt 155 lb (70.3 kg)   SpO2 90%   BMI 23.57 kg/m   Physical Examination:  General: Well developed, well nourished, NAD  HEENT: OP clear, mucus membranes moist  SKIN: warm, dry. No rashes. Neuro: No focal deficits  Musculoskeletal: Muscle strength 5/5 all ext  Psychiatric: Mood and affect normal  Neck: No JVD, no carotid bruits, no thyromegaly, no lymphadenopathy.  Lungs:Clear bilaterally, no wheezes, rhonci, crackles Cardiovascular: Regular rate and rhythm. No murmurs, gallops or rubs. Abdomen:Soft. Bowel sounds present. Non-tender.  Extremities: No lower extremity edema. Pulses are 2 + in the bilateral DP/PT.  Echo 02/03/13: Left ventricle: The cavity size was normal. Wall thickness was normal. Systolic function was normal. The estimated ejection fraction was in the range of 50% to 55%. Doppler parameters are consistent with abnormal left ventricular relaxation (grade 1 diastolic dysfunction).  EKG:  EKG is not ordered today. The ekg ordered today demonstrates  Recent Labs: 07/18/2017: BUN 49; Creatinine, Ser 4.12; Hemoglobin 9.1; Platelets 162; Potassium 5.1; Sodium 134   Lipid Panel    Component Value Date/Time   CHOL 183 03/20/2015 1001   TRIG 91 06/08/2015 0016   HDL 34.40 (L) 03/20/2015 1001   CHOLHDL 5 03/20/2015 1001   VLDL 38.8  03/20/2015 1001   LDLCALC 109 (H) 03/20/2015 1001   LDLDIRECT 93.9 02/03/2013 0933     Wt Readings from Last 3 Encounters:  11/08/17 155 lb (70.3 kg)  07/20/17 156 lb (70.8 kg)  07/18/17 156 lb (70.8 kg)     Other studies Reviewed: Additional studies/ records that were reviewed today include: . Review of the above records demonstrates:    Assessment and Plan:   1. CAD without angina. Recent PCI/stenting of the RCA March 2019 at [facility]. No chest pain now. Will continue ASA, Plavix, statin and beta blocker.     2. HTN: BP is controlled today. No changes  3. Hyperlipidemia: Continue statin.   4. Atrial fib, paroxysmal: He is in sinus today. He did not tolerate coumadin due to GI bleeding. He cannot take a NOAC with HD.  CHADS VASC Score of 6 so long term anti-coagulation would be favorable but is not felt to be feasible. Continue ASA and Toprol.   5. Aortic stenosis: He has moderate AS by echo at [facility] in march 2019. Mean gradient 25 mmHg. TAVR workup at [facility] and felt to have poor transfemoral access if TAVR were pursued. Fortunately, his AS is only moderate. No symptoms. Will repeat echo in September 2019.    30 minutes spent reviewing outside records and preparing chart.   Current medicines are reviewed at length with the patient today.  The patient does not have concerns regarding medicines.  The following changes have been made:  no change  Labs/ tests ordered today include:   Orders Placed This Encounter  Procedures  . ECHOCARDIOGRAM COMPLETE    Disposition:   FU with me in 6 months  Signed, Lauree Chandler, MD 11/08/2017 4:57 PM    [facility] [address], [place], [place]  [address] Phone: [phone]; Fax: [phone]

## 2017-11-09 ENCOUNTER — Emergency Department (HOSPITAL_COMMUNITY)
Admission: EM | Admit: 2017-11-09 | Discharge: 2017-11-09 | Disposition: A | Discharge status: Home / Self Care | Payer: Medicare PPO | Attending: Emergency Medicine | Admitting: Emergency Medicine

## 2017-11-09 ENCOUNTER — Ambulatory Visit: Payer: Medicare PPO | Admitting: Physician Assistant

## 2017-11-09 ENCOUNTER — Encounter (HOSPITAL_COMMUNITY): Payer: Self-pay | Admitting: Emergency Medicine

## 2017-11-09 ENCOUNTER — Telehealth: Payer: Self-pay | Admitting: Internal Medicine

## 2017-11-09 DIAGNOSIS — S81012A Laceration without foreign body, left knee, initial encounter: Secondary | ICD-10-CM | POA: Diagnosis not present

## 2017-11-09 DIAGNOSIS — W19XXXA Unspecified fall, initial encounter: Secondary | ICD-10-CM | POA: Insufficient documentation

## 2017-11-09 DIAGNOSIS — I509 Heart failure, unspecified: Secondary | ICD-10-CM | POA: Insufficient documentation

## 2017-11-09 DIAGNOSIS — Y9301 Activity, walking, marching and hiking: Secondary | ICD-10-CM | POA: Diagnosis not present

## 2017-11-09 DIAGNOSIS — Y92531 Health care provider office as the place of occurrence of the external cause: Secondary | ICD-10-CM | POA: Diagnosis not present

## 2017-11-09 DIAGNOSIS — Z7982 Long term (current) use of aspirin: Secondary | ICD-10-CM | POA: Diagnosis not present

## 2017-11-09 DIAGNOSIS — S81812A Laceration without foreign body, left lower leg, initial encounter: Secondary | ICD-10-CM | POA: Diagnosis not present

## 2017-11-09 DIAGNOSIS — Z7902 Long term (current) use of antithrombotics/antiplatelets: Secondary | ICD-10-CM | POA: Diagnosis not present

## 2017-11-09 DIAGNOSIS — Z79899 Other long term (current) drug therapy: Secondary | ICD-10-CM | POA: Diagnosis not present

## 2017-11-09 DIAGNOSIS — Z87891 Personal history of nicotine dependence: Secondary | ICD-10-CM | POA: Diagnosis not present

## 2017-11-09 DIAGNOSIS — N186 End stage renal disease: Secondary | ICD-10-CM | POA: Diagnosis not present

## 2017-11-09 DIAGNOSIS — I132 Hypertensive heart and chronic kidney disease with heart failure and with stage 5 chronic kidney disease, or end stage renal disease: Secondary | ICD-10-CM | POA: Insufficient documentation

## 2017-11-09 DIAGNOSIS — N2581 Secondary hyperparathyroidism of renal origin: Secondary | ICD-10-CM | POA: Diagnosis not present

## 2017-11-09 DIAGNOSIS — J449 Chronic obstructive pulmonary disease, unspecified: Secondary | ICD-10-CM | POA: Insufficient documentation

## 2017-11-09 DIAGNOSIS — Y999 Unspecified external cause status: Secondary | ICD-10-CM | POA: Diagnosis not present

## 2017-11-09 DIAGNOSIS — Z992 Dependence on renal dialysis: Secondary | ICD-10-CM | POA: Diagnosis not present

## 2017-11-09 MED ORDER — LIDOCAINE-EPINEPHRINE 2 %-1:100000 IJ SOLN
20.0000 mL | Freq: Once | INTRAMUSCULAR | Status: DC
  Filled 2017-11-09 (×2): qty 20

## 2017-11-09 MED ORDER — LIDOCAINE-EPINEPHRINE (PF) 2 %-1:200000 IJ SOLN
20.0000 mL | Freq: Once | INTRAMUSCULAR | Status: AC
  Administered 2017-11-09: 20 mL
  Filled 2017-11-09: qty 20

## 2017-11-09 NOTE — Discharge Instructions (Signed)
Keep dressing in place for today, then do dressing changes with antibiotic ointment once or twice a day.

## 2017-11-09 NOTE — ED Notes (Signed)
Quick clot applied to L knee, Jamie PA-C at bedside at this time.

## 2017-11-09 NOTE — Telephone Encounter (Signed)
lmtcb x1 for pt's spouse, Thayer Headings

## 2017-11-09 NOTE — ED Triage Notes (Signed)
Pt reports skin tear to L knee at approx 3PM yesterday, reports plavix. Denies pain, able to bear weight. Denies hitting his head.

## 2017-11-09 NOTE — ED Provider Notes (Signed)
[facility] EMERGENCY DEPARTMENT Provider Note   CSN: [id number] Arrival date & time: 11/09/17  0019     History   Chief Complaint Chief Complaint  Patient presents with  . Fall    skin tear    HPI Terry [name]. is a 83 y.o. male.  The history is provided by the patient and medical records. No language interpreter was used.  Fall    Terry [name]. is a 83 y.o. male who presents to the Emergency Department complaining of persistent bleeding to left knee. He is on Plavix. He fell while walking out of the cardiologist's office around 3 PM. He scratched right knee on the cement. No head injury or LOC. He denies any arthralgias/myalgias. He was able to get up without assistance and has been ambulatory without any difficulty. He applied pressure to the area and thought bleeding had stopped. He put a bandage to the area. Tonight, he realized that bandage was soaked through and bleeding had started back again, therefore came to ER.   Past Medical History:  Diagnosis Date  . Acute and chronic respiratory failure 10/22/2012  . Anxiety   . Arthritis    "hands, knees" (03/15/2017)  . Atrial fibrillation ([place])   . CAD (coronary artery disease) [place], [name]  . CHF (congestive heart failure) ([place]) 09/2012  . COPD (chronic obstructive pulmonary disease) ([place])   . ESRD (end stage renal disease) on dialysis [facility])    "MWF; [facility] [place]" (03/15/2017)  . Flu 09/24/2016   influenza b  . HCAP (healthcare-associated pneumonia) 03/15/2017  . High cholesterol   . History of blood transfusion 2012   "related to bowel resection"  . Hypertension   . On home oxygen therapy    "1L typically at night" (03/15/2017)  . Osteoarthritis 10/22/2012  . Pneumonia April 2014; 09/2016  . Shortness of breath dyspnea   . Stroke [facility]) 2012   denies residual on 03/15/2017  . Stroke [facility]) ~ 2013   "small one in his right eye"    Patient Active Problem List   Diagnosis Date Noted  . Anemia   . Coronavirus infection 06/22/2017  . End stage renal disease on dialysis ([place])   . Hypoxia   . COPD with acute exacerbation ([place]) 06/21/2017  . Sepsis ([place]) 06/21/2017  . Hypertension 03/15/2017  . Chest pain 03/15/2017  . High cholesterol 03/15/2017  . CKD (chronic kidney disease) stage V requiring chronic dialysis ([place]) 03/15/2017  . COPD (chronic obstructive pulmonary disease) GOLD stage III 03/15/2017  . Chronic hypoxemic respiratory failure ([place]) 03/15/2017  . PAF (paroxysmal atrial fibrillation) ([place]) 03/15/2017  . Acute on chronic respiratory failure with hypoxia ([place])   . Pulmonary infiltrates on CXR 02/10/2017  . Upper airway cough syndrome 08/13/2016  . Memory loss of unknown cause 04/08/2016  . Chronic respiratory failure with hypoxia ([place]) 08/27/2015  . ESRD (end stage renal disease) on dialysis ([place]) 07/28/2015  . HCAP (healthcare-associated pneumonia)   . Pneumothorax, left   . Chronic diastolic heart failure ([place])   . Anemia of chronic disease   . Pneumonia due to Enterobacter cloacae ([place])   . Pneumonia due to Klebsiella pneumoniae ([place])   . Chronic atrial fibrillation ([place])   . CAD (coronary artery disease) 02/04/2015  . Hyperkalemia 04/13/2014  . COPD GOLD III 10/22/2012  . Other and unspecified hyperlipidemia 10/22/2012  . Osteoarthritis 10/22/2012  . Essential hypertension 10/22/2012    Past Surgical History:  Procedure Laterality Date  . APPENDECTOMY  1984  . AV FISTULA PLACEMENT Right 05/21/2015   Procedure: Right Arm Brachiocephalic ARTERIOVENOUS (AV) FISTULA CREATION;  Surgeon: Angelia Mould, MD;  Location: [facility];  Service: Vascular;  Laterality: Right;  . CATARACT EXTRACTION W/ INTRAOCULAR LENS  IMPLANT, BILATERAL Bilateral   . COLECTOMY  2012   partial colon removed - twisted bowel  . CORONARY ANGIOPLASTY WITH STENT PLACEMENT  1999   2 stents  . CYSTOSCOPY/RETROGRADE/URETEROSCOPY Bilateral 04/23/2014    Procedure: CYSTOSCOPY BILATERAL RETROGRADE,  LEFT URETEROSCOPY, LEFT STENT PLACEMENT;  Surgeon: Raynelle Bring, MD;  [facility];  Service: Urology;  Laterality: Bilateral;  . EYE SURGERY Left    "surgery to clean off lens after cataract OR"  . INGUINAL HERNIA REPAIR Right         Home Medications    Prior to Admission medications   Medication Sig Start Date End Date Taking? Authorizing Provider  Aclidinium Bromide (TUDORZA PRESSAIR) 400 MCG/ACT AEPB Inhale 1 puff into the lungs 2 (two) times daily. 07/20/17   Tanda Rockers, MD  albuterol (PROAIR HFA) 108 [phone] Base) MCG/ACT inhaler 2 puffs every 4 hours as needed only  if your can't catch your breath 07/20/17   Tanda Rockers, MD  albuterol (PROVENTIL) (2.5 MG/3ML) 0.083% nebulizer solution Take 3 mLs (2.5 mg total) by nebulization every 6 (six) hours as needed for wheezing or shortness of breath. 07/16/17   Parrett, Fonnie Mu, NP  amoxicillin (AMOXIL) 500 MG capsule Take 1 capsule (500 mg total) by mouth 3 (three) times daily. 10/19/17   Gardiner Barefoot, DPM  aspirin EC 81 MG tablet Take 81 mg by mouth daily.    [provider]  atorvastatin (LIPITOR) 80 MG tablet Take 1 tablet by mouth daily. 09/11/17 09/11/18  [provider]  azithromycin (ZITHROMAX) 250 MG tablet Take 2 on day one then 1 daily x 4 days 07/20/17   Tanda Rockers, MD  calcium acetate (PHOSLO) 667 MG capsule Take 667-1,334 mg by mouth 3 (three) times daily with meals.  01/09/16   [provider]  cholecalciferol (VITAMIN D) 1000 units tablet Take 1,000 Units by mouth daily.    [provider]  clopidogrel (PLAVIX) 75 MG tablet Take 75 mg by mouth daily.    [provider]  ethyl chloride spray Apply 1 application topically See admin instructions. For dialysis 05/28/17   [provider]  methocarbamol (ROBAXIN) 500 MG tablet Take 500 mg by mouth 2 (two) times daily as needed for muscle spasms.    [provider]    metoprolol succinate (TOPROL-XL) 50 MG 24 hr tablet Take 50 mg by mouth 2 (two) times daily. Take with or immediately following a meal.     [provider]  midodrine (PROAMATINE) 10 MG tablet Take 10 mg by mouth as needed. At dialysis for low pressure    [provider]  multivitamin (RENA-VIT) TABS tablet Take 1 tablet by mouth daily.    [provider]  OXYGEN Inhale 1 L into the lungs at bedtime.     [provider]  predniSONE (DELTASONE) 10 MG tablet Take 1 tablet (10 mg total) by mouth daily with breakfast. Take one tablet my mouth daily. 08/05/17   Tanda Rockers, MD  SYMBICORT 160-4.5 MCG/ACT inhaler INHALE 2 PUFFS TWICE DAILY 11/30/16   Tanda Rockers, MD  SYMBICORT 160-4.5 MCG/ACT inhaler INHALE 2 PUFFS INTO LUNGS TWICE DAILY 08/03/17   Christinia Gully  B, MD    Family History Family History  Problem Relation Age of Onset  . Cancer Mother        Breast  . Heart disease Father   . Heart attack Father   . Parkinson's disease Brother     Social History Social History   Tobacco Use  . Smoking status: Former Smoker    Packs/day: 1.00    Years: 49.00    Pack years: 49.00    Types: Cigarettes, Pipe, Cigars    Last attempt to quit: 02/21/2000    Years since quitting: 17.7  . Smokeless tobacco: Former Systems developer    Types: Chew  . Tobacco comment: "chewed when he was a kid"  Substance Use Topics  . Alcohol use: Yes    Comment: 03/15/2017 "nothing since the 1960's"  . Drug use: No     Allergies   Yellow dyes (non-tartrazine); Levaquin [levofloxacin in d5w]; and Levofloxacin   Review of Systems Review of Systems  Musculoskeletal: Negative for arthralgias and myalgias.  Skin: Positive for wound.  Neurological: Negative for syncope.     Physical Exam Updated Vital Signs BP (!) 153/59 (BP Location: Left Arm)   Pulse 73   Temp 98.2 F (36.8 C) (Oral)   Resp 16   Ht 5\' 8"  (1.727 m)   Wt 70.3 kg (155 lb)   SpO2 98%   BMI 23.57 kg/m    Physical Exam  Constitutional: He appears well-developed and well-nourished. No distress.  HENT:  Head: Normocephalic and atraumatic.  Neck: Neck supple.  Cardiovascular: Normal rate, regular rhythm and normal heart sounds.  No murmur heard. Pulmonary/Chest: Effort normal and breath sounds normal. No respiratory distress. He has no wheezes. He has no rales.  Musculoskeletal:  Left knee with full ROM. 5/5 muscle strength to LLE. Quarter-sized skin tear to the knee. Tenderness to the skin tear itself, but no other tenderness.   Neurological: He is alert.  Skin: Skin is warm and dry.  Nursing note and vitals reviewed.    ED Treatments / Results  Labs (all labs ordered are listed, but only abnormal results are displayed) Labs Reviewed - No data to display  EKG None  Radiology No results found.  Procedures Procedures (including critical care time)  Medications Ordered in ED Medications - No data to display   Initial Impression / Assessment and Plan / ED Course  I have reviewed the triage vital signs and the nursing notes.  Pertinent labs & imaging results that were available during my care of the patient were reviewed by me and considered in my medical decision making (see chart for details).    Daryn Hicks. is a 83 y.o. male who presents to ED for persistent bleeding to left knee from skin tear which occurred around 3 PM today. Exam with skin tear with active bleeding. Quick clot applied, but wound still bleeding at time of shift change. Care assumed by Dr. Betsey Holiday who will continue to manage bleeding and dispo appropriately.   Final Clinical Impressions(s) / ED Diagnoses   Final diagnoses:  Noninfected skin tear of left lower extremity, initial encounter    ED Discharge Orders    None       Melvena Vink, Ozella Almond, PA-C 11/09/17 0116    Orpah Greek, MD 11/09/17 0222

## 2017-11-09 NOTE — ED Notes (Signed)
Patient left at this time with all belongings. 

## 2017-11-10 ENCOUNTER — Telehealth: Payer: Self-pay | Admitting: Adult Health

## 2017-11-10 ENCOUNTER — Encounter: Payer: Self-pay | Admitting: Adult Health

## 2017-11-10 ENCOUNTER — Other Ambulatory Visit: Payer: Self-pay | Admitting: Adult Health

## 2017-11-10 ENCOUNTER — Ambulatory Visit: Payer: Medicare PPO | Admitting: Adult Health

## 2017-11-10 ENCOUNTER — Ambulatory Visit (INDEPENDENT_AMBULATORY_CARE_PROVIDER_SITE_OTHER)
Admission: RE | Admit: 2017-11-10 | Discharge: 2017-11-10 | Disposition: A | Discharge status: Home / Self Care | Payer: Medicare PPO | Source: Ambulatory Visit | Attending: Adult Health | Admitting: Adult Health

## 2017-11-10 ENCOUNTER — Telehealth: Payer: Self-pay | Admitting: Cardiovascular Disease

## 2017-11-10 VITALS — BP 156/54 | HR 84 | Temp 98.6°F

## 2017-11-10 DIAGNOSIS — N186 End stage renal disease: Secondary | ICD-10-CM | POA: Diagnosis not present

## 2017-11-10 DIAGNOSIS — S8992XA Unspecified injury of left lower leg, initial encounter: Secondary | ICD-10-CM | POA: Diagnosis not present

## 2017-11-10 DIAGNOSIS — M25562 Pain in left knee: Secondary | ICD-10-CM

## 2017-11-10 DIAGNOSIS — N2581 Secondary hyperparathyroidism of renal origin: Secondary | ICD-10-CM | POA: Diagnosis not present

## 2017-11-10 MED ORDER — ATORVASTATIN CALCIUM 80 MG PO TABS: 80.0000 mg | ORAL_TABLET | Freq: Every day | ORAL | 3 refills | Status: DC

## 2017-11-10 MED ORDER — CLOPIDOGREL BISULFATE 75 MG PO TABS: 75.0000 mg | ORAL_TABLET | Freq: Every day | ORAL | 3 refills | Status: DC

## 2017-11-10 NOTE — Telephone Encounter (Signed)
Prescriptions sent to pharmacy.  Patient's wife made aware.

## 2017-11-10 NOTE — Telephone Encounter (Signed)
Wife Thayer Headings [phone])  returned phone call; states she has not received any calls in regards to this message;

## 2017-11-10 NOTE — Progress Notes (Signed)
Subjective:    Patient ID: Terry [name]., male    DOB: Dec 25, 1934, 83 y.o.   MRN: [id number]  HPI  83 year old male who  has a past medical history of Acute and chronic respiratory failure (10/22/2012), Anxiety, Arthritis, Atrial fibrillation ([place]), CAD (coronary artery disease) (1999), CHF (congestive heart failure) ([place]) (09/2012), COPD (chronic obstructive pulmonary disease) ([place]), ESRD (end stage renal disease) on dialysis ([place]), Flu (09/24/2016), HCAP (healthcare-associated pneumonia) (03/15/2017), High cholesterol, History of blood transfusion (2012), Hypertension, On home oxygen therapy, Osteoarthritis (10/22/2012), Pneumonia (April 2014; 09/2016), Shortness of breath dyspnea, Stroke [facility]) (2012), and Stroke [facility]) (~ 2013).  He is a patient of Dr. Maudie Mercury who I am seeing today for an acute issue. He was seen in local ER last night for persistent bleeding to left knee. He fell while walking out of the cardiology office and scratched his right knee on the cement. Due to being on Plavix the bleeding was not controlled. Ultimately, wound was injected with lidocaine w/epi and dressed with quik clot, wrapped in ace wrap.   Xray was not done.   Today in the office he reports that over the night his pain became worse and he was unable to sleep. He took Gabapentin but did not help with the pain.  Pain is worse with weightbearing and is having trouble walking.   He and his wife would like to have some medication for pain and would like to have an xray done.   Review of Systems See HPI   Past Medical History:  Diagnosis Date  . Acute and chronic respiratory failure 10/22/2012  . Anxiety   . Arthritis    "hands, knees" (03/15/2017)  . Atrial fibrillation ([place])   . CAD (coronary artery disease) [place], [name]  . CHF (congestive heart failure) ([place]) 09/2012  . COPD (chronic obstructive pulmonary disease) ([place])   . ESRD (end stage renal disease) on dialysis [facility])    "MWF; [facility] [place]" (03/15/2017)  . Flu 09/24/2016   influenza b  . HCAP (healthcare-associated pneumonia) 03/15/2017  . High cholesterol   . History of blood transfusion 2012   "related to bowel resection"  . Hypertension   . On home oxygen therapy    "1L typically at night" (03/15/2017)  . Osteoarthritis 10/22/2012  . Pneumonia April 2014; 09/2016  . Shortness of breath dyspnea   . Stroke [facility]) 2012   denies residual on 03/15/2017  . Stroke [facility]) ~ 2013   "small one in his right eye"    Social History   Socioeconomic History  . Marital status: Married    Spouse name: Not on file  . Number of children: 3  . Years of education: Not on file  . Highest education level: Not on file  Occupational History  . Occupation: retired-worked in Writer: RETIRED  Social Needs  . Financial resource strain: Not on file  . Food insecurity:    Worry: Not on file    Inability: Not on file  . Transportation needs:    Medical: Not on file    Non-medical: Not on file  Tobacco Use  . Smoking status: Former Smoker    Packs/day: 1.00    Years: 49.00    Pack years: 49.00    Types: Cigarettes, Pipe, Cigars    Last attempt to quit: 02/21/2000    Years since quitting: 17.7  . Smokeless tobacco: Former Systems developer    Types: Chew  .  Tobacco comment: "chewed when he was a kid"  Substance and Sexual Activity  . Alcohol use: Yes    Comment: 03/15/2017 "nothing since the 1960's"  . Drug use: No  . Sexual activity: Never  Lifestyle  . Physical activity:    Days per week: Not on file    Minutes per session: Not on file  . Stress: Not on file  Relationships  . Social connections:    Talks on phone: Not on file    Gets together: Not on file    Attends religious service: Not on file    Active member of club or organization: Not on file    Attends meetings of clubs or organizations: Not on file    Relationship status: Not on file  . Intimate partner violence:    Fear of current or ex partner: Not on file     Emotionally abused: Not on file    Physically abused: Not on file    Forced sexual activity: Not on file  Other Topics Concern  . Not on file  Social History Narrative   Work or School: retired [employer] Situation: lives with [place] in [place] Beliefs: [place] regular; poor diet          Past Surgical History:  Procedure Laterality Date  . APPENDECTOMY  1984  . AV FISTULA PLACEMENT Right 05/21/2015   Procedure: Right Arm Brachiocephalic ARTERIOVENOUS (AV) FISTULA CREATION;  Surgeon: Angelia Mould, MD;  Location: [facility];  Service: Vascular;  Laterality: Right;  . CATARACT EXTRACTION W/ INTRAOCULAR LENS  IMPLANT, BILATERAL Bilateral   . COLECTOMY  2012   partial colon removed - twisted bowel  . CORONARY ANGIOPLASTY WITH STENT PLACEMENT  1999   2 stents  . CYSTOSCOPY/RETROGRADE/URETEROSCOPY Bilateral 04/23/2014   Procedure: CYSTOSCOPY BILATERAL RETROGRADE,  LEFT URETEROSCOPY, LEFT STENT PLACEMENT;  Surgeon: Raynelle Bring, MD;  [facility];  Service: Urology;  Laterality: Bilateral;  . EYE SURGERY Left    "surgery to clean off lens after cataract OR"  . INGUINAL HERNIA REPAIR Right     Family History  Problem Relation Age of Onset  . Cancer Mother        Breast  . Heart disease Father   . Heart attack Father   . Parkinson's disease Brother     Allergies  Allergen Reactions  . Yellow Dyes (Non-Tartrazine) Other (See Comments)    Burns arms Cough medications (tussionex and benzonatate ok)  . Levaquin [Levofloxacin In D5w] Itching  . Levofloxacin Nausea And Vomiting    Current Outpatient Medications on File Prior to Visit  Medication Sig Dispense Refill  . Aclidinium Bromide (TUDORZA PRESSAIR) 400 MCG/ACT AEPB Inhale 1 puff into the lungs 2 (two) times daily. 60 each 5  . albuterol (PROAIR HFA) 108 (90 Base) MCG/ACT inhaler 2 puffs every 4 hours as needed only  if your can't catch your breath 1 Inhaler  11  . albuterol (PROVENTIL) (2.5 MG/3ML) 0.083% nebulizer solution Take 3 mLs (2.5 mg total) by nebulization every 6 (six) hours as needed for wheezing or shortness of breath. 75 mL 2  . aspirin EC 81 MG tablet Take 81 mg by mouth daily.    [name] atorvastatin (LIPITOR) 80 MG tablet Take 1 tablet by mouth daily.    . calcium acetate (PHOSLO) 667 MG capsule Take 667-1,334 mg by mouth 3 (three) times daily with meals.     [name]  cholecalciferol (VITAMIN D) 1000 units tablet Take 1,000 Units by mouth daily.    . clopidogrel (PLAVIX) 75 MG tablet Take 75 mg by mouth daily.    [name] ethyl chloride spray Apply 1 application topically See admin instructions. For dialysis    . methocarbamol (ROBAXIN) 500 MG tablet Take 500 mg by mouth 2 (two) times daily as needed for muscle spasms.    . metoprolol succinate (TOPROL-XL) 50 MG 24 hr tablet Take 50 mg by mouth 2 (two) times daily. Take with or immediately following a meal.     . midodrine (PROAMATINE) 10 MG tablet Take 10 mg by mouth as needed. At dialysis for low pressure    . multivitamin (RENA-VIT) TABS tablet Take 1 tablet by mouth daily.    . OXYGEN Inhale 1 L into the lungs at bedtime.     . predniSONE (DELTASONE) 10 MG tablet Take 1 tablet (10 mg total) by mouth daily with breakfast. Take one tablet my mouth daily. (Patient taking differently: Take 5 mg by mouth daily with breakfast. Take one tablet my mouth daily.) 30 tablet 0  . SYMBICORT 160-4.5 MCG/ACT inhaler INHALE 2 PUFFS TWICE DAILY 3 Inhaler 3  . SYMBICORT 160-4.5 MCG/ACT inhaler INHALE 2 PUFFS INTO LUNGS TWICE DAILY 1 Inhaler 11   No current facility-administered medications on file prior to visit.     BP (!) 156/54   Pulse 84   Temp 98.6 F (37 C)   SpO2 95%       Objective:   Physical Exam  Constitutional: He is oriented to person, place, and time. He appears well-developed and well-nourished. No distress.  Musculoskeletal: He exhibits tenderness.  Has tenderness throughout left knee.  Unable to do full exam due to tenderness. Mild swelling noted.   Neurological: He is alert and oriented to person, place, and time.  Skin: Skin is warm and dry. He is not diaphoretic.  Bruising noted throughout left knee. He has a quarter sized skin tear to left knee.   Nursing note and vitals reviewed.     Assessment & Plan:  1. Acute pain of left knee - Will get xray of left knee; per family and patient and wife's request. Advised that I cannot write them for pain medication, that would be up to PCP. Educated patient that tenderness is to be expected. Has prescribed Tramadol at home. He is ok to take that.  - DG Knee 1-2 Views Left; Future  Dorothyann Peng, NP

## 2017-11-10 NOTE — Telephone Encounter (Signed)
PFT is scheduled at 4:00p on 11/18/17.  Where would like to add pt on it?

## 2017-11-10 NOTE — Telephone Encounter (Signed)
Pt's wife calling  These are new prescription per pt's wife calling.  Dr [name] told pt he would sent in prescription for Atorvastin 80mg [facility]  but when they went to pharmacy it was there.  Generic Plavix need a new prescription also.

## 2017-11-10 NOTE — Telephone Encounter (Signed)
Attempted to contact pt. Call went straight to voicemail. I have left for the pt to return our call.

## 2017-11-10 NOTE — Telephone Encounter (Signed)
Ok to add on same day but must bring all meds

## 2017-11-10 NOTE — Telephone Encounter (Signed)
Pt's spouse, Thayer Headings is aware of below message and voiced her understanding.  Pt is not scheduled for OV on 5/30, only a PFT ordered by Dr. Karene Fry. First available with MW is 12/10/17.  MW please advise. Thanks

## 2017-11-10 NOTE — Telephone Encounter (Signed)
p pfts- I'll still be here

## 2017-11-10 NOTE — Telephone Encounter (Signed)
Updated patient's wife of xray of left knee. No acute abnormality

## 2017-11-10 NOTE — Telephone Encounter (Signed)
I'm seeing him in 8 days so fine to try the lower dose of prednisone now but go back to the previous one if worse, also fine to try off tudorza but please bring all active meds/ inhalers and the tudorza with him so we can troubleshoot what he really needs

## 2017-11-10 NOTE — Telephone Encounter (Signed)
Spoke with pt's wife, Thayer Headings. She is wanting pt's prednisone dosage decreased to 2.5mg  daily. Thayer Headings did not give a reason why, she stated, "I just think this needs to be done." Pt is also having a hard time with using [place]. Thayer Headings states that the medication is getting "clogged" in the inhaler. She is refusing to go refill this medication because she believes it's a waste of money. Thayer Headings would like Dr. Gustavus Bryant recommendations on both issues.  Dr. Melvyn Novas - please advise. Thanks.

## 2017-11-11 NOTE — Telephone Encounter (Signed)
Attempted to contact pt. I did not receive an answer. There was no option for me to leave a message. Will try back.  

## 2017-11-12 DIAGNOSIS — N2581 Secondary hyperparathyroidism of renal origin: Secondary | ICD-10-CM | POA: Diagnosis not present

## 2017-11-12 DIAGNOSIS — N186 End stage renal disease: Secondary | ICD-10-CM | POA: Diagnosis not present

## 2017-11-12 NOTE — Telephone Encounter (Signed)
Called and spoke to pt's wife, Terry [name]. Informed her of the recs per MW. Changed PFT time to 3pm and OV with MW at 4. Terry [name] verbalized understanding and denied any further questions or concerns at this time.

## 2017-11-13 DIAGNOSIS — I2510 Atherosclerotic heart disease of native coronary artery without angina pectoris: Secondary | ICD-10-CM | POA: Diagnosis not present

## 2017-11-13 DIAGNOSIS — J449 Chronic obstructive pulmonary disease, unspecified: Secondary | ICD-10-CM | POA: Diagnosis not present

## 2017-11-13 DIAGNOSIS — D500 Iron deficiency anemia secondary to blood loss (chronic): Secondary | ICD-10-CM | POA: Diagnosis not present

## 2017-11-13 DIAGNOSIS — Z992 Dependence on renal dialysis: Secondary | ICD-10-CM | POA: Diagnosis not present

## 2017-11-13 DIAGNOSIS — L0390 Cellulitis, unspecified: Secondary | ICD-10-CM | POA: Diagnosis not present

## 2017-11-13 DIAGNOSIS — K922 Gastrointestinal hemorrhage, unspecified: Secondary | ICD-10-CM | POA: Diagnosis not present

## 2017-11-13 DIAGNOSIS — I5031 Acute diastolic (congestive) heart failure: Secondary | ICD-10-CM | POA: Diagnosis not present

## 2017-11-13 DIAGNOSIS — M6281 Muscle weakness (generalized): Secondary | ICD-10-CM | POA: Diagnosis not present

## 2017-11-13 DIAGNOSIS — R2681 Unsteadiness on feet: Secondary | ICD-10-CM | POA: Diagnosis not present

## 2017-11-15 DIAGNOSIS — Z992 Dependence on renal dialysis: Secondary | ICD-10-CM | POA: Diagnosis not present

## 2017-11-15 DIAGNOSIS — N186 End stage renal disease: Secondary | ICD-10-CM | POA: Diagnosis not present

## 2017-11-17 ENCOUNTER — Other Ambulatory Visit: Payer: Self-pay | Admitting: Internal Medicine

## 2017-11-17 ENCOUNTER — Other Ambulatory Visit: Payer: Self-pay | Admitting: Cardiothoracic Surgery

## 2017-11-17 ENCOUNTER — Other Ambulatory Visit (HOSPITAL_COMMUNITY): Payer: Medicare PPO

## 2017-11-17 DIAGNOSIS — R0600 Dyspnea, unspecified: Secondary | ICD-10-CM

## 2017-11-17 DIAGNOSIS — N186 End stage renal disease: Secondary | ICD-10-CM | POA: Diagnosis not present

## 2017-11-17 DIAGNOSIS — N2581 Secondary hyperparathyroidism of renal origin: Secondary | ICD-10-CM | POA: Diagnosis not present

## 2017-11-18 ENCOUNTER — Encounter: Payer: Self-pay | Admitting: Internal Medicine

## 2017-11-18 ENCOUNTER — Ambulatory Visit (INDEPENDENT_AMBULATORY_CARE_PROVIDER_SITE_OTHER): Payer: Medicare PPO | Admitting: Internal Medicine

## 2017-11-18 ENCOUNTER — Ambulatory Visit: Payer: Medicare PPO | Admitting: Internal Medicine

## 2017-11-18 VITALS — BP 124/60 | HR 107 | Ht 67.5 in | Wt 154.0 lb

## 2017-11-18 DIAGNOSIS — R0600 Dyspnea, unspecified: Secondary | ICD-10-CM | POA: Diagnosis not present

## 2017-11-18 DIAGNOSIS — I10 Essential (primary) hypertension: Secondary | ICD-10-CM | POA: Diagnosis not present

## 2017-11-18 DIAGNOSIS — J449 Chronic obstructive pulmonary disease, unspecified: Secondary | ICD-10-CM

## 2017-11-18 MED ORDER — GLYCOPYRROLATE-FORMOTEROL 9-4.8 MCG/ACT IN AERO: 2.0000 | INHALATION_SPRAY | Freq: Two times a day (BID) | RESPIRATORY_TRACT | 11 refills | Status: DC

## 2017-11-18 NOTE — Patient Instructions (Addendum)
No change in respiratory medications but ok to wean  Prednisone to 5 mg a/w  2.5 daily x one week and if ok then 2.5 mg daily  - if nausea or worse breathing resume the previous dose and really worse ceiling is actually 20 mg per day   Leave off tudorza and  [place]  Take 2 puffs first thing in am and then another 2 puffs about 12 hours later.      Stop lisinopril for now and let Dr Sanda Klein team handle it  For cough > delsym 2 every 12 hours as needed    Please schedule a follow up visit in 3 months but call sooner if needed

## 2017-11-18 NOTE — Progress Notes (Signed)
Subjective:    Patient ID: Terry [name], male    DOB: September 14, 1934    MRN: [id number]   Brief patient profile:  42 yowm quit smoking 2001 bothered by doe seemed better then worse in 2010 p "lung infection" and stayed on advair/spiriva  Combo since 2012 seemed better then admitted in [facility] with pna 10/09/12  in then transferred to [facility] NH x 3 weeks and discharged  On May 1st  2014 referred to pulmonary clinic 11/29/12  by [facility] for copd eval and proved to have GOLD III copd 01/26/13 with symptoms much better off ACEi.    History of Present Illness 08/27/2015  Extended post hosp /transition of care  f/u ov/Wert re: COPD GOLD III recurrent admits/ now chronic resp failure/HD dep CRI plus GOLD III rx symb/spriiva/neb saba Chief Complaint  Patient presents with  . Follow-up    Pt c/o occasional cough with white mucus. Pt denies wheeze/increased SOB/CP/tightness. Pt is on O2/2L, he states that on 3L it dries out his nose and burns. Pt also c/o low BP readings and wonders if some of his HTN meds need to be changed.   now sleeping in a recliner since admit 30-45 degrees due to sob/some noct cough but excess/ purulent sputum or mucus plugs   On prednisone with floor of   @ 10 mg since d/c 08/19/15  rec May need to consider adding Oracit to your regimen and I will le Dr Lorrene Reid know You will need to adjust your 02 to saturations of over 90%  Best fit and humidified 02 ordered  Plan A = Automatic = Symbicort 160 2 in am and 2 in pm with spiriva each am and prednisone 10 mg with breakfast daily  Work on inhaler technique:  Plan B = Backup Only use your albuterol (proventil) as a rescue medication Plan C = Crisis - only use your albuterol nebulizer if you first try Plan B and it fails to help > ok to use the nebulizer up to every 4 hours but if start needing it regularly call for immediate appointment    01/07/2016  f/u ov/Wert re:  Copd GOLD III/ rx 5 prednisone / symb/spiriva and rare saba   (maybe once a week)  02 hs only  Chief Complaint  Patient presents with  . Follow-up    pt states breathing is baseline since last OV. c/o sob & non prod cough.  02 1lpm hs rarely during the day / c/o dry mouth ? From spiriva dpi Doe = MMRC3 = can't walk 100 yards even at a slow pace at a flat grade s stopping due to sob   rec Plan A = Automatic = symbicort 160 Take 2 puffs first thing in am and then another 2 puffs about 12 hours later and spiriva 1.25 x 4 or 2.5 x 2puffs in am only  Plan B = Backup Only use your albuterol (ventolin) as a rescue medication  Plan C = Crisis - only use your albuterol nebulizer if you first try Plan B and it fails to help > ok to use the nebulizer up to every 4 hours but if start needing it regularly call for immediate appointment Prednisone 5 mg daily   Please schedule a follow up visit in 3 months but call sooner if needed     09/29/16 NP rec Begin Tudorza 1 puff Twice daily , brush rinse and gargle after use. > bothered mouth  Finish Tamiflu and Cipro .  Taper  Prednisone as directed to 10mg  1/2 daily .  Follow med calendar closely and bring to each visit.     07/20/2017  f/u ov/Wert re:  Transition of care Copd/ 02 1-2lpm  - no med cal Chief Complaint  Patient presents with  . Follow-up    Was at [facility] 1/25-/127/19 for copd exacerbation. Given steroids and abx. Mildly feeling better.   Dyspnea: across the room on 02 Cough: dry/ does not keep him up as long as takes delsym Sleep: 30 degrees in recliner and 1lpm  Saba:   Daytime only  rec Plan A = Automatic = symbicort / tudorza  Plan B = Backup Only use your albuterol as a rescue medication  Plan C = Crisis - only use your albuterol nebulizer if you first try Plan B  Plan D = Deltasone (prednisone-  Double the dose that you are on until better then taper back to a floor of 10 mg daily  See Tammy NP w/in 2 weeks with all your medications did not do     11/18/2017  f/u ov/Wert re:  Copd  gold IV not able to trigger tudorza  Chief Complaint  Patient presents with  . Follow-up    Doing better has had two stents placed in heart since last visit.    Dyspnea:  rollator MMRC3 = can't walk 100 yards even at a slow pace at a flat grade s stopping due to sob  On RA can do one aisle at [facility] then has to stop  Cough: worse since started lisinopril/  still some rattling / white mucus esp in am  Sleep: ok recliner > 30 degrees  No 02  SABA use:  Once a week  No increase need off of Tudorza  At prednisone 10 mg one half daily    No obvious day to day or daytime variability or assoc excess/ purulent sputum or mucus plugs or hemoptysis or cp or chest tightness, subjective wheeze or overt sinus or hb symptoms. No unusual exposure hx or h/o childhood pna/ asthma or knowledge of premature birth.  Sleeping in recliner  without nocturnal  or early am exacerbation  of respiratory  c/o's or need for noct saba. Also denies any obvious fluctuation of symptoms with weather or environmental changes or other aggravating or alleviating factors except as outlined above   Current Allergies, Complete Past Medical History, Past Surgical History, Family History, and Social History were reviewed in [employer] record.  ROS  The following are not active complaints unless bolded Hoarseness, sore throat, dysphagia, dental problems, itching, sneezing,  nasal congestion or discharge of excess mucus or purulent secretions, ear ache,   fever, chills, sweats, unintended wt loss or wt gain, classically pleuritic or exertional cp,  orthopnea pnd or arm/hand swelling  or leg swelling, presyncope, palpitations, abdominal pain, anorexia, nausea, vomiting, diarrhea  or change in bowel habits or change in bladder habits, change in stools or change in urine, dysuria, hematuria,  rash, arthralgias, visual complaints, headache, numbness, weakness or ataxia or problems with walking or coordination,  change in mood  or  memory.             Current Meds  - not able to confirm, now on lisinopril  Medication Sig  . Aclidinium Bromide (TUDORZA PRESSAIR) 400 MCG/ACT AEPB Inhale 1 puff into the lungs 2 (two) times daily. (Patient not taking: Reported on 11/21/2017)  . aspirin EC 81 MG tablet Take 81 mg by mouth daily.  [name]  atorvastatin (LIPITOR) 80 MG tablet Take 1 tablet (80 mg total) by mouth daily.  . calcium acetate (PHOSLO) 667 MG capsule Take 667-1,334 mg by mouth 3 (three) times daily with meals.   . clopidogrel (PLAVIX) 75 MG tablet Take 1 tablet (75 mg total) by mouth daily.  [name] ethyl chloride spray Apply 1 application topically See admin instructions. For dialysis  . Glycopyrrolate-Formoterol (BEVESPI AEROSPHERE) 9-4.8 MCG/ACT AERO Inhale 2 puffs into the lungs 2 (two) times daily.  . methocarbamol (ROBAXIN) 500 MG tablet Take 500 mg by mouth 2 (two) times daily as needed for muscle spasms.  . metoprolol succinate (TOPROL-XL) 50 MG 24 hr tablet Take 50 mg by mouth 2 (two) times daily. Take with or immediately following a meal.   . midodrine (PROAMATINE) 10 MG tablet Take 10 mg by mouth as needed. At dialysis for low pressure  . OXYGEN Inhale 1 L into the lungs at bedtime.   . [  albuterol (PROAIR HFA) 108 (90 Base) MCG/ACT inhaler 2 puffs every 4 hours as needed only  if your can't catch your breath  . [ ]  albuterol (PROVENTIL) (2.5 MG/3ML) 0.083% nebulizer solution Take 3 mLs (2.5 mg total) by nebulization every 6 (six) hours as needed for wheezing or shortness of breath.  .     .    .                      Objective:   Physical Exam  amb stoic wm using rollator/ clearing throat freq   02/26/2015          210 >  08/27/2015 163 p HD > 09/26/2015  159 > 11/26/2015  160 > 01/07/2016  159 > 04/07/2016  159 > 05/06/2016>   07/21/2016  157 > 08/13/2016  152  > 12/10/2016  150 > 02/09/2017  152 > 03/09/2017  156 > 04/20/2017  152 > 05/20/2017  152 >    07/20/2017 156 >      Vital signs reviewed - Note on  arrival 02 sats  93% on RA        HEENT: nl dentition / oropharynx. Nl external ear canals without cough reflex - moderate bilateral non-specific turbinate edema     NECK :  without JVD/Nodes/TM/ nl carotid upstrokes bilaterally   LUNGS: no acc muscle use,  Mod barrel  contour chest wall with bilateral  Distant bs s audible wheeze and  without cough on insp or exp maneuver and mod   Hyperresonant  to  percussion bilaterally     CV:  RRR  no s3 or murmur or increase in P2, and 1+ pitting L / trace R no change from prior  ABD:  soft and nontender with pos mid insp Hoover's  in the supine position. No bruits or organomegaly appreciated, bowel sounds nl  MS:   Nl gait/  ext warm without deformities, calf tenderness, cyanosis or clubbing No obvious joint restrictions   SKIN: warm and dry without lesions    NEURO:  alert, approp, nl sensorium with  no motor or cerebellar deficits apparent.

## 2017-11-18 NOTE — Progress Notes (Signed)
PFT done today. 

## 2017-11-19 ENCOUNTER — Encounter: Payer: Self-pay | Admitting: Internal Medicine

## 2017-11-19 DIAGNOSIS — N186 End stage renal disease: Secondary | ICD-10-CM | POA: Diagnosis not present

## 2017-11-19 DIAGNOSIS — N2581 Secondary hyperparathyroidism of renal origin: Secondary | ICD-10-CM | POA: Diagnosis not present

## 2017-11-19 DIAGNOSIS — Z992 Dependence on renal dialysis: Secondary | ICD-10-CM | POA: Diagnosis not present

## 2017-11-19 NOTE — Assessment & Plan Note (Addendum)
-   Trial off ACEi 11/30/2012 >>> much better symptom control - 05/28/2014   Walked RA x one lap @ 185 stopped due to  [employer] / sats ok/ mod pace - PFT's 01/26/13      FEV1  1.28 (48%) ratio 48 and no better p B2,  DLCO 39 corrects to 48%  - PFTs 02/26/2015   FEV1 0.99  (35 % ) ratio 35  p 11 % improvement from saba with DLCO  24 % corrects to 35  % for alv volume  p am symb/spiriva    - Pred maint rx  Since d/c 08/14/15  - 09/26/2015  extensive coaching HFA effectiveness =    75%  - 11/26/2015 rec reduce pred to 5 mg daily   - 05/22/2016  After extensive coaching HFA effectiveness =  90%  - 07/21/2016 try off spiriva due to throat irritation > restarted at rec of wife during cough but did not help it so d/c'd again 08/13/2016 with plan to change to spriva smi if breathing worse  - 02/09/2017 added spiriva > did not tolerate    - PFT's  11/18/2017  FEV1 0.68 (26 % ) ratio 39  p 4 % improvement from saba p symbicort and no lama prior to study with DLCO  21 % corrects to 36  % for alv volume   - 11/18/2017  After extensive coaching inhaler device  effectiveness = 90%   Bevespi Take 2 puffs first thing in am and then another 2 puffs about 12 hours later.     Group D in terms of symptom/risk and laba/lama/ICS  therefore appropriate rx at this point but since haven't been able to wean completely  off prednisone(since 07/2015) even on high dose hfa with good technique and he can't tol various forms of lama rec try bevespi 2bid and consider adding daliresp if flares as taper systemic steroids and in meantime stop acei as confuses the picture with non-specific side effects that overlap with copd    I had an extended discussion with the patient reviewing all relevant studies completed to date and  lasting 15 to 20 minutes of a 25 minute visit    See device teaching which extended face to face time for this visit.  Each maintenance medication was reviewed in detail including emphasizing most importantly the difference between  maintenance and prns and under what circumstances the prns are to be triggered using an action plan format that is not reflected in the computer generated alphabetically organized AVS which I have not found useful in most complex patients, especially with respiratory illnesses  Please see AVS for specific instructions unique to this visit that I personally wrote and verbalized to the the pt in detail and then reviewed with pt  by my nurse highlighting any  changes in therapy recommended at today's visit to their plan of care.

## 2017-11-19 NOTE — Assessment & Plan Note (Addendum)
Trial off acei  11/30/2012 due to cough > resolved  D/c acei 11/18/2017 > f/u renal   ACE inhibitors are problematic in  pts with airway complaints because  even experienced pulmonologists can't always distinguish ace effects from copd/asthma.  By themselves they don't actually cause a problem, much like oxygen can't by itself start a fire, but they certainly serve as a powerful catalyst or enhancer for any "fire"  or inflammatory process in the upper airway, be it caused by an ET  tube or more commonly reflux (especially in the obese or pts with known GERD or who are on biphoshonates) or even mild PNDS, which may be the case her.   He has already tried and failed before to tolerate ACEi so rec alternative rx - since bp checked at HD and seeing renal for hbp will defer alternative.

## 2017-11-21 ENCOUNTER — Emergency Department (HOSPITAL_COMMUNITY): Payer: Medicare PPO

## 2017-11-21 ENCOUNTER — Emergency Department (HOSPITAL_COMMUNITY)
Admission: EM | Admit: 2017-11-21 | Discharge: 2017-11-21 | Disposition: A | Discharge status: Home / Self Care | Payer: Medicare PPO | Source: Home / Self Care | Attending: Emergency Medicine | Admitting: Emergency Medicine

## 2017-11-21 ENCOUNTER — Encounter (HOSPITAL_COMMUNITY): Payer: Self-pay

## 2017-11-21 ENCOUNTER — Other Ambulatory Visit: Payer: Self-pay

## 2017-11-21 DIAGNOSIS — R0682 Tachypnea, not elsewhere classified: Secondary | ICD-10-CM | POA: Diagnosis not present

## 2017-11-21 DIAGNOSIS — N186 End stage renal disease: Secondary | ICD-10-CM | POA: Diagnosis present

## 2017-11-21 DIAGNOSIS — R600 Localized edema: Secondary | ICD-10-CM

## 2017-11-21 DIAGNOSIS — R040 Epistaxis: Secondary | ICD-10-CM | POA: Diagnosis present

## 2017-11-21 DIAGNOSIS — Z9049 Acquired absence of other specified parts of digestive tract: Secondary | ICD-10-CM | POA: Diagnosis not present

## 2017-11-21 DIAGNOSIS — Z9842 Cataract extraction status, left eye: Secondary | ICD-10-CM | POA: Diagnosis not present

## 2017-11-21 DIAGNOSIS — D649 Anemia, unspecified: Secondary | ICD-10-CM

## 2017-11-21 DIAGNOSIS — J9621 Acute and chronic respiratory failure with hypoxia: Secondary | ICD-10-CM | POA: Diagnosis present

## 2017-11-21 DIAGNOSIS — I132 Hypertensive heart and chronic kidney disease with heart failure and with stage 5 chronic kidney disease, or end stage renal disease: Secondary | ICD-10-CM

## 2017-11-21 DIAGNOSIS — Z9841 Cataract extraction status, right eye: Secondary | ICD-10-CM | POA: Diagnosis not present

## 2017-11-21 DIAGNOSIS — Z992 Dependence on renal dialysis: Secondary | ICD-10-CM | POA: Diagnosis not present

## 2017-11-21 DIAGNOSIS — Z8673 Personal history of transient ischemic attack (TIA), and cerebral infarction without residual deficits: Secondary | ICD-10-CM | POA: Diagnosis not present

## 2017-11-21 DIAGNOSIS — J441 Chronic obstructive pulmonary disease with (acute) exacerbation: Secondary | ICD-10-CM | POA: Diagnosis present

## 2017-11-21 DIAGNOSIS — E785 Hyperlipidemia, unspecified: Secondary | ICD-10-CM | POA: Diagnosis present

## 2017-11-21 DIAGNOSIS — J208 Acute bronchitis due to other specified organisms: Secondary | ICD-10-CM

## 2017-11-21 DIAGNOSIS — Z9981 Dependence on supplemental oxygen: Secondary | ICD-10-CM | POA: Diagnosis not present

## 2017-11-21 DIAGNOSIS — I509 Heart failure, unspecified: Secondary | ICD-10-CM | POA: Diagnosis not present

## 2017-11-21 DIAGNOSIS — Z7902 Long term (current) use of antithrombotics/antiplatelets: Secondary | ICD-10-CM | POA: Insufficient documentation

## 2017-11-21 DIAGNOSIS — Z7982 Long term (current) use of aspirin: Secondary | ICD-10-CM | POA: Insufficient documentation

## 2017-11-21 DIAGNOSIS — R05 Cough: Secondary | ICD-10-CM | POA: Diagnosis not present

## 2017-11-21 DIAGNOSIS — I2510 Atherosclerotic heart disease of native coronary artery without angina pectoris: Secondary | ICD-10-CM | POA: Diagnosis present

## 2017-11-21 DIAGNOSIS — M19041 Primary osteoarthritis, right hand: Secondary | ICD-10-CM | POA: Diagnosis present

## 2017-11-21 DIAGNOSIS — M19042 Primary osteoarthritis, left hand: Secondary | ICD-10-CM | POA: Diagnosis present

## 2017-11-21 DIAGNOSIS — J9600 Acute respiratory failure, unspecified whether with hypoxia or hypercapnia: Secondary | ICD-10-CM | POA: Diagnosis not present

## 2017-11-21 DIAGNOSIS — I480 Paroxysmal atrial fibrillation: Secondary | ICD-10-CM | POA: Diagnosis present

## 2017-11-21 DIAGNOSIS — J9601 Acute respiratory failure with hypoxia: Secondary | ICD-10-CM | POA: Diagnosis not present

## 2017-11-21 DIAGNOSIS — R0602 Shortness of breath: Secondary | ICD-10-CM | POA: Diagnosis present

## 2017-11-21 DIAGNOSIS — I248 Other forms of acute ischemic heart disease: Secondary | ICD-10-CM | POA: Diagnosis present

## 2017-11-21 DIAGNOSIS — R0902 Hypoxemia: Secondary | ICD-10-CM | POA: Diagnosis not present

## 2017-11-21 DIAGNOSIS — J449 Chronic obstructive pulmonary disease, unspecified: Secondary | ICD-10-CM | POA: Insufficient documentation

## 2017-11-21 DIAGNOSIS — Z87891 Personal history of nicotine dependence: Secondary | ICD-10-CM | POA: Insufficient documentation

## 2017-11-21 DIAGNOSIS — N2581 Secondary hyperparathyroidism of renal origin: Secondary | ICD-10-CM | POA: Diagnosis present

## 2017-11-21 DIAGNOSIS — Z961 Presence of intraocular lens: Secondary | ICD-10-CM | POA: Diagnosis present

## 2017-11-21 DIAGNOSIS — I5032 Chronic diastolic (congestive) heart failure: Secondary | ICD-10-CM | POA: Diagnosis present

## 2017-11-21 DIAGNOSIS — D631 Anemia in chronic kidney disease: Secondary | ICD-10-CM | POA: Diagnosis present

## 2017-11-21 DIAGNOSIS — R0689 Other abnormalities of breathing: Secondary | ICD-10-CM | POA: Diagnosis not present

## 2017-11-21 DIAGNOSIS — M170 Bilateral primary osteoarthritis of knee: Secondary | ICD-10-CM | POA: Diagnosis present

## 2017-11-21 DIAGNOSIS — J440 Chronic obstructive pulmonary disease with acute lower respiratory infection: Secondary | ICD-10-CM | POA: Diagnosis present

## 2017-11-21 DIAGNOSIS — E7800 Pure hypercholesterolemia, unspecified: Secondary | ICD-10-CM | POA: Diagnosis present

## 2017-11-21 LAB — BASIC METABOLIC PANEL
ANION GAP: 15 (ref 5–15)
BUN: 40 mg/dL — ABNORMAL HIGH (ref 6–20)
CHLORIDE: 88 mmol/L — AB (ref 101–111)
CO2: 28 mmol/L (ref 22–32)
Calcium: 9 mg/dL (ref 8.9–10.3)
Creatinine, Ser: 5.84 mg/dL — ABNORMAL HIGH (ref 0.61–1.24)
GFR calc Af Amer: 9 mL/min — ABNORMAL LOW (ref >60)
GFR, EST NON AFRICAN AMERICAN: 8 mL/min — AB (ref >60)
GLUCOSE: 106 mg/dL — AB (ref 65–99)
POTASSIUM: 4.4 mmol/L (ref 3.5–5.1)
Sodium: 131 mmol/L — ABNORMAL LOW (ref 135–145)

## 2017-11-21 LAB — CBC
HEMATOCRIT: 33.2 % — AB (ref 39.0–52.0)
HEMOGLOBIN: 10.2 g/dL — AB (ref 13.0–17.0)
MCH: 31.7 pg (ref 26.0–34.0)
MCHC: 30.7 g/dL (ref 30.0–36.0)
MCV: 103.1 fL — ABNORMAL HIGH (ref 78.0–100.0)
Platelets: 247 10*3/uL (ref 150–400)
RBC: 3.22 MIL/uL — ABNORMAL LOW (ref 4.22–5.81)
RDW: 17.9 % — ABNORMAL HIGH (ref 11.5–15.5)
WBC: 9.2 10*3/uL (ref 4.0–10.5)

## 2017-11-21 LAB — I-STAT TROPONIN, ED: Troponin i, poc: 0.04 ng/mL (ref 0.00–0.08)

## 2017-11-21 MED ORDER — DOXYCYCLINE HYCLATE 100 MG PO CAPS: 100.0000 mg | ORAL_CAPSULE | Freq: Two times a day (BID) | ORAL | 0 refills | Status: DC

## 2017-11-21 MED ORDER — BENZONATATE 100 MG PO CAPS
100.0000 mg | ORAL_CAPSULE | Freq: Once | ORAL | Status: AC
  Administered 2017-11-21: 100 mg via ORAL
  Filled 2017-11-21: qty 1

## 2017-11-21 MED ORDER — PREDNISONE 5 MG PO TABS: 10.0000 mg | ORAL_TABLET | Freq: Every day | ORAL | 0 refills | Status: DC

## 2017-11-21 MED ORDER — DOXYCYCLINE HYCLATE 100 MG PO TABS
100.0000 mg | ORAL_TABLET | Freq: Once | ORAL | Status: AC
  Administered 2017-11-21: 100 mg via ORAL
  Filled 2017-11-21: qty 1

## 2017-11-21 MED ORDER — BENZONATATE 100 MG PO CAPS: 100.0000 mg | ORAL_CAPSULE | Freq: Three times a day (TID) | ORAL | 0 refills | Status: DC

## 2017-11-21 MED ORDER — ALBUTEROL SULFATE HFA 108 (90 BASE) MCG/ACT IN AERS: 2.0000 | INHALATION_SPRAY | RESPIRATORY_TRACT | 0 refills | Status: DC | PRN

## 2017-11-21 MED ORDER — AEROCHAMBER PLUS W/MASK MISC
1.0000 | Freq: Once | Status: AC
  Administered 2017-11-21: 1
  Filled 2017-11-21: qty 1

## 2017-11-21 MED ORDER — ALBUTEROL SULFATE (2.5 MG/3ML) 0.083% IN NEBU: 2.5000 mg | INHALATION_SOLUTION | RESPIRATORY_TRACT | 0 refills | Status: DC | PRN

## 2017-11-21 NOTE — ED Notes (Signed)
Patient transported to X-ray 

## 2017-11-21 NOTE — ED Provider Notes (Signed)
[facility] EMERGENCY DEPARTMENT Provider Note   CSN: [id number] Arrival date & time: 11/21/17  1221     History   Chief Complaint Chief Complaint  Patient presents with  . Shortness of Breath    HPI Terry Betsch. is a 83 y.o. male.complains of shortness of breath and cough productive of white sputum for the past one week. Maximum temperature 99.8. No nausea or vomiting. No chest pain. No lightheadedness. He's worn his oxygen at 1 L continuously. He has not administered any breathing treatments at home. Nothing makes symptoms better or worse. No other associated symptoms he is presently breathing normally and is asymptomatic since being placed on oxygen here.he reports bilateral leg edema intermittent, chronic no other associated symptoms  HPI  Past Medical History:  Diagnosis Date  . Acute and chronic respiratory failure 10/22/2012  . Anxiety   . Arthritis    "hands, knees" (03/15/2017)  . Atrial fibrillation ([place])   . CAD (coronary artery disease) [place], [name]  . CHF (congestive heart failure) ([place]) 09/2012  . COPD (chronic obstructive pulmonary disease) ([place])   . ESRD (end stage renal disease) on dialysis [facility])    "MWF; [facility], [place]" (03/15/2017)  . Flu 09/24/2016   influenza b  . HCAP (healthcare-associated pneumonia) 03/15/2017  . High cholesterol   . History of blood transfusion 2012   "related to bowel resection"  . Hypertension   . On home oxygen therapy    "1L typically at night" (03/15/2017)  . Osteoarthritis 10/22/2012  . Pneumonia April 2014; 09/2016  . Shortness of breath dyspnea   . Stroke [facility]) 2012   denies residual on 03/15/2017  . Stroke [facility]) ~ 2013   "small one in his right eye"    Patient Active Problem List   Diagnosis Date Noted  . Anemia   . Coronavirus infection 06/22/2017  . End stage renal disease on dialysis ([place])   . Hypoxia   . COPD with acute exacerbation ([place]) 06/21/2017  . Sepsis ([place])  06/21/2017  . Hypertension 03/15/2017  . Chest pain 03/15/2017  . High cholesterol 03/15/2017  . CKD (chronic kidney disease) stage V requiring chronic dialysis ([place]) 03/15/2017  . COPD (chronic obstructive pulmonary disease) GOLD stage III 03/15/2017  . Chronic hypoxemic respiratory failure ([place]) 03/15/2017  . PAF (paroxysmal atrial fibrillation) ([place]) 03/15/2017  . Acute on chronic respiratory failure with hypoxia ([place])   . Pulmonary infiltrates on CXR 02/10/2017  . Upper airway cough syndrome 08/13/2016  . Memory loss of unknown cause 04/08/2016  . Chronic respiratory failure with hypoxia ([place]) 08/27/2015  . ESRD (end stage renal disease) on dialysis ([place]) 07/28/2015  . HCAP (healthcare-associated pneumonia)   . Pneumothorax, left   . Chronic diastolic heart failure ([place])   . Anemia of chronic disease   . Pneumonia due to Enterobacter cloacae ([place])   . Pneumonia due to Klebsiella pneumoniae ([place])   . Chronic atrial fibrillation ([place])   . CAD (coronary artery disease) 02/04/2015  . Hyperkalemia 04/13/2014  . COPD GOLD IV 10/22/2012  . Other and unspecified hyperlipidemia 10/22/2012  . Osteoarthritis 10/22/2012  . Essential hypertension 10/22/2012    Past Surgical History:  Procedure Laterality Date  . APPENDECTOMY  1984  . AV FISTULA PLACEMENT Right 05/21/2015   Procedure: Right Arm Brachiocephalic ARTERIOVENOUS (AV) FISTULA CREATION;  Surgeon: Angelia Mould, MD;  Location: [facility];  Service: Vascular;  Laterality: Right;  . CATARACT EXTRACTION W/ INTRAOCULAR LENS  IMPLANT, BILATERAL Bilateral   . COLECTOMY  2012   partial colon removed - twisted bowel  . CORONARY ANGIOPLASTY WITH STENT PLACEMENT  1999   2 stents  . CYSTOSCOPY/RETROGRADE/URETEROSCOPY Bilateral 04/23/2014   Procedure: CYSTOSCOPY BILATERAL RETROGRADE,  LEFT URETEROSCOPY, LEFT STENT PLACEMENT;  Surgeon: Raynelle Bring, MD;  Location: [facility];  Service: Urology;  Laterality: Bilateral;  . EYE SURGERY Left      "surgery to clean off lens after cataract OR"  . INGUINAL HERNIA REPAIR Right         Home Medications    Prior to Admission medications   Medication Sig Start Date End Date Taking? Authorizing Provider  acetaminophen (TYLENOL) 500 MG tablet Take 1,000 mg by mouth as needed for mild pain or headache.   Yes [provider]  albuterol (PROAIR HFA) 108 (90 Base) MCG/ACT inhaler 2 puffs every 4 hours as needed only  if your can't catch your breath 07/20/17  Yes Tanda Rockers, MD  albuterol (PROVENTIL) (2.5 MG/3ML) 0.083% nebulizer solution Take 3 mLs (2.5 mg total) by nebulization every 6 (six) hours as needed for wheezing or shortness of breath. 07/16/17  Yes Parrett, Tammy S, NP  aspirin EC 81 MG tablet Take 81 mg by mouth daily.   Yes [provider]  atorvastatin (LIPITOR) 80 MG tablet Take 1 tablet (80 mg total) by mouth daily. 11/10/17  Yes Burnell Blanks, MD  budesonide-formoterol [facility]) 160-4.5 MCG/ACT inhaler Inhale 2 puffs into the lungs 2 (two) times daily.   Yes [provider]  calcium acetate (PHOSLO) 667 MG capsule Take 667-1,334 mg by mouth 3 (three) times daily with meals.  01/09/16  Yes [provider]  cetirizine (ZYRTEC) 10 MG tablet Take 10 mg by mouth as needed for allergies.   Yes [provider]  clopidogrel (PLAVIX) 75 MG tablet Take 1 tablet (75 mg total) by mouth daily. 11/10/17  Yes Burnell Blanks, MD  dextromethorphan-guaiFENesin [facility] DM) 30-600 MG 12hr tablet Take 1 tablet by mouth 2 (two) times daily as needed for cough.   Yes [provider]  diphenhydrAMINE (BENADRYL) 25 MG tablet Take 25 mg by mouth as needed for allergies.   Yes [provider]  ethyl chloride spray Apply 1 application topically See admin instructions. For dialysis 05/28/17  Yes [provider]  Glycopyrrolate-Formoterol (BEVESPI AEROSPHERE) 9-4.8 MCG/ACT AERO Inhale 2 puffs into the lungs 2 (two)  times daily. 11/18/17  Yes Tanda Rockers, MD  lisinopril (PRINIVIL,ZESTRIL) 20 MG tablet Take 20 mg by mouth daily.   Yes [provider]  methocarbamol (ROBAXIN) 500 MG tablet Take 500 mg by mouth 2 (two) times daily as needed for muscle spasms.   Yes [provider]  metoprolol succinate (TOPROL-XL) 50 MG 24 hr tablet Take 50 mg by mouth 2 (two) times daily. Take with or immediately following a meal.    Yes [provider]  midodrine (PROAMATINE) 10 MG tablet Take 10 mg by mouth as needed. At dialysis for low pressure   Yes [provider]  OXYGEN Inhale 1 L into the lungs at bedtime.    Yes [provider]  predniSONE (DELTASONE) 10 MG tablet Take 1 tablet (10 mg total) by mouth daily with breakfast. Take one tablet my mouth daily. Patient taking differently: Take 5 mg by mouth daily with breakfast. Take one tablet my mouth daily. 08/05/17  Yes Tanda Rockers, MD  Aclidinium Bromide (TUDORZA PRESSAIR) 400 MCG/ACT AEPB Inhale 1  puff into the lungs 2 (two) times daily. Patient not taking: Reported on 11/21/2017 07/20/17   Tanda Rockers, MD    Family History Family History  Problem Relation Age of Onset  . Cancer Mother        Breast  . Heart disease Father   . Heart attack Father   . Parkinson's disease Brother     Social History Social History   Tobacco Use  . Smoking status: Former Smoker    Packs/day: 1.00    Years: 49.00    Pack years: 49.00    Types: Cigarettes, Pipe, Cigars    Last attempt to quit: 02/21/2000    Years since quitting: 17.7  . Smokeless tobacco: Former Systems developer    Types: Chew  . Tobacco comment: "chewed when he was a kid"  Substance Use Topics  . Alcohol use: Yes    Comment: 03/15/2017 "nothing since the 1960's"  . Drug use: No     Allergies   Yellow dyes (non-tartrazine); Levaquin [levofloxacin in d5w]; and Levofloxacin   Review of Systems Review of Systems  Respiratory: Positive for cough and shortness of  breath.   Cardiovascular: Positive for leg swelling.  All other systems reviewed and are negative.    Physical Exam Updated Vital Signs BP 109/68 (BP Location: Left Arm)   Pulse (!) 101   Temp 99.7 F (37.6 C) (Oral)   Resp (!) 24   Ht 5\' 8"  (1.727 m)   Wt 70.3 kg (155 lb)   SpO2 97%   BMI 23.57 kg/m   Physical Exam  Constitutional:  Chronically ill-appearing no respiratory distress  HENT:  Head: Normocephalic and atraumatic.  Eyes: Pupils are equal, round, and reactive to light. Conjunctivae are normal.  Neck: Neck supple. No tracheal deviation present. No thyromegaly present.  No JVD  Cardiovascular: Normal rate and regular rhythm.  No murmur heard. Pulmonary/Chest: Effort normal.  Can't dry crackles  Abdominal: Soft. Bowel sounds are normal. He exhibits no distension. There is no tenderness.  Musculoskeletal: Normal range of motion. He exhibits edema. He exhibits no tenderness.  +1 pretibial pitting edema bilaterally  Neurological: He is alert. Coordination normal.  Skin: Skin is warm and dry. No rash noted.  Psychiatric: He has a normal mood and affect.  Nursing note and vitals reviewed.    ED Treatments / Results  Labs (all labs ordered are listed, but only abnormal results are displayed) Labs Reviewed  BASIC METABOLIC PANEL - Abnormal; Notable for the following components:      Result Value   Sodium 131 (*)    Chloride 88 (*)    Glucose, Bld 106 (*)    BUN 40 (*)    Creatinine, Ser 5.84 (*)    GFR calc non Af Amer 8 (*)    GFR calc Af Amer 9 (*)    All other components within normal limits  CBC - Abnormal; Notable for the following components:   RBC 3.22 (*)    Hemoglobin 10.2 (*)    HCT 33.2 (*)    MCV 103.1 (*)    RDW 17.9 (*)    All other components within normal limits  I-STAT TROPONIN, ED    EKG EKG Interpretation  Date/Time:  Sunday November 21 2017 13:01:25 EDT Ventricular Rate:  84 PR Interval:  138 QRS Duration: 84 QT  Interval:  376 QTC Calculation: 444 R Axis:   64 Text Interpretation:  Sinus rhythm with Premature supraventricular complexes and with occasional Premature ventricular  complexes Nonspecific ST and T wave abnormality Abnormal ECG No significant change since last tracing Confirmed by Orlie Dakin [phone]) on 11/21/2017 1:12:06 PM   Radiology Dg Chest 2 View  Result Date: 11/21/2017 CLINICAL DATA:  Shortness of breath and coughing. EXAM: CHEST - 2 VIEW COMPARISON:  July 16, 2017 FINDINGS: Atelectasis in the bases. No pneumothorax. Cardiomegaly. No other abnormalities. IMPRESSION: No active cardiopulmonary disease. Electronically Signed   By: Dorise Bullion III M.D   On: 11/21/2017 13:46    Procedures Procedures (including critical care time)  Medications Ordered in ED Medications - No data to display  chest x-ray viewed by me Initial Impression / Assessment and Plan / ED Course  I have reviewed the triage vital signs and the nursing notes.  Pertinent labs & imaging results that were available during my care of the patient were reviewed by me and considered in my medical decision making (see chart for details).     Lab work consistent with chronic renal insufficiency,and anemia. Hemoglobin improved over 4 months ago 3:45 PM patient resting comfortably. No respiratory distress. Speaks in paragraphs on oxygen 1 L.  Symptoms consistent with bronchitis. Plans prescription doxycycline,Tessalon prednisone, since his wife states he may run out. He is chronically on prednisone 10 mg daily He will get a spacer to use with his albuterol inhaler prescription albuterol inhaler and albuterol nebulizer solution to use every 4 hours as needed. Instructed to return if needed more than every 4 hours. Follow-up with Dr.WERT,, pulmonologist Final Clinical Impressions(s) / ED Diagnoses  Diagnosis #1acute bronchitis #2 chronic renal insufficiency Final diagnoses:  None  #3 anemia  ED Discharge  Orders    None       Orlie Dakin, MD 11/21/17 1553

## 2017-11-21 NOTE — ED Triage Notes (Signed)
Pt reports having cough with increased home oxygen use. Pt typically dose no use O2 at home and is now on 2L [place].

## 2017-11-21 NOTE — Discharge Instructions (Signed)
Used her albuterol inhaler with spacer 2 puffs every 4 hours or albuterol nebulizer every 4 hours as needed for cough or shortness of breath.return if needed more than every 4 hours. It's okay to where your oxygen 1 L at all times. Return if you need more than 1 L of oxygen. Take the antibiotic as prescribed. Take prednisone 10 mg dailyTake the cough medicine as prescribed. Call Dr. Gustavus Bryant office tomorrow to arrange for follow-up.return if concern for any reason

## 2017-11-22 ENCOUNTER — Emergency Department (HOSPITAL_COMMUNITY): Payer: Medicare PPO

## 2017-11-22 ENCOUNTER — Other Ambulatory Visit: Payer: Self-pay

## 2017-11-22 ENCOUNTER — Inpatient Hospital Stay (HOSPITAL_COMMUNITY)
Admission: EM | Admit: 2017-11-22 | Discharge: 2017-11-27 | DRG: 190 | Disposition: A | Discharge status: Home Health | Payer: Medicare PPO | Attending: Internal Medicine | Admitting: Internal Medicine

## 2017-11-22 ENCOUNTER — Encounter (HOSPITAL_COMMUNITY): Payer: Self-pay | Admitting: Emergency Medicine

## 2017-11-22 DIAGNOSIS — I248 Other forms of acute ischemic heart disease: Secondary | ICD-10-CM | POA: Diagnosis present

## 2017-11-22 DIAGNOSIS — Z9841 Cataract extraction status, right eye: Secondary | ICD-10-CM

## 2017-11-22 DIAGNOSIS — N2581 Secondary hyperparathyroidism of renal origin: Secondary | ICD-10-CM | POA: Diagnosis not present

## 2017-11-22 DIAGNOSIS — M19042 Primary osteoarthritis, left hand: Secondary | ICD-10-CM | POA: Diagnosis present

## 2017-11-22 DIAGNOSIS — I350 Nonrheumatic aortic (valve) stenosis: Secondary | ICD-10-CM | POA: Diagnosis present

## 2017-11-22 DIAGNOSIS — Z9981 Dependence on supplemental oxygen: Secondary | ICD-10-CM

## 2017-11-22 DIAGNOSIS — I480 Paroxysmal atrial fibrillation: Secondary | ICD-10-CM | POA: Diagnosis present

## 2017-11-22 DIAGNOSIS — Z881 Allergy status to other antibiotic agents status: Secondary | ICD-10-CM

## 2017-11-22 DIAGNOSIS — M19041 Primary osteoarthritis, right hand: Secondary | ICD-10-CM | POA: Diagnosis present

## 2017-11-22 DIAGNOSIS — M898X9 Other specified disorders of bone, unspecified site: Secondary | ICD-10-CM | POA: Diagnosis present

## 2017-11-22 DIAGNOSIS — J441 Chronic obstructive pulmonary disease with (acute) exacerbation: Principal | ICD-10-CM | POA: Diagnosis present

## 2017-11-22 DIAGNOSIS — I5032 Chronic diastolic (congestive) heart failure: Secondary | ICD-10-CM | POA: Diagnosis present

## 2017-11-22 DIAGNOSIS — Z8249 Family history of ischemic heart disease and other diseases of the circulatory system: Secondary | ICD-10-CM

## 2017-11-22 DIAGNOSIS — I2510 Atherosclerotic heart disease of native coronary artery without angina pectoris: Secondary | ICD-10-CM | POA: Diagnosis present

## 2017-11-22 DIAGNOSIS — J449 Chronic obstructive pulmonary disease, unspecified: Secondary | ICD-10-CM | POA: Diagnosis present

## 2017-11-22 DIAGNOSIS — Z7982 Long term (current) use of aspirin: Secondary | ICD-10-CM

## 2017-11-22 DIAGNOSIS — J9600 Acute respiratory failure, unspecified whether with hypoxia or hypercapnia: Secondary | ICD-10-CM | POA: Diagnosis present

## 2017-11-22 DIAGNOSIS — I160 Hypertensive urgency: Secondary | ICD-10-CM | POA: Diagnosis present

## 2017-11-22 DIAGNOSIS — Z9102 Food additives allergy status: Secondary | ICD-10-CM

## 2017-11-22 DIAGNOSIS — Z955 Presence of coronary angioplasty implant and graft: Secondary | ICD-10-CM

## 2017-11-22 DIAGNOSIS — Z7952 Long term (current) use of systemic steroids: Secondary | ICD-10-CM

## 2017-11-22 DIAGNOSIS — R040 Epistaxis: Secondary | ICD-10-CM | POA: Diagnosis present

## 2017-11-22 DIAGNOSIS — J9621 Acute and chronic respiratory failure with hypoxia: Secondary | ICD-10-CM | POA: Diagnosis present

## 2017-11-22 DIAGNOSIS — I132 Hypertensive heart and chronic kidney disease with heart failure and with stage 5 chronic kidney disease, or end stage renal disease: Secondary | ICD-10-CM | POA: Diagnosis present

## 2017-11-22 DIAGNOSIS — Z992 Dependence on renal dialysis: Secondary | ICD-10-CM

## 2017-11-22 DIAGNOSIS — Z7902 Long term (current) use of antithrombotics/antiplatelets: Secondary | ICD-10-CM

## 2017-11-22 DIAGNOSIS — J440 Chronic obstructive pulmonary disease with acute lower respiratory infection: Secondary | ICD-10-CM | POA: Diagnosis present

## 2017-11-22 DIAGNOSIS — Z9842 Cataract extraction status, left eye: Secondary | ICD-10-CM

## 2017-11-22 DIAGNOSIS — Z820 Family history of epilepsy and other diseases of the nervous system: Secondary | ICD-10-CM

## 2017-11-22 DIAGNOSIS — E785 Hyperlipidemia, unspecified: Secondary | ICD-10-CM | POA: Diagnosis present

## 2017-11-22 DIAGNOSIS — R0602 Shortness of breath: Secondary | ICD-10-CM

## 2017-11-22 DIAGNOSIS — D631 Anemia in chronic kidney disease: Secondary | ICD-10-CM | POA: Diagnosis present

## 2017-11-22 DIAGNOSIS — M170 Bilateral primary osteoarthritis of knee: Secondary | ICD-10-CM | POA: Diagnosis present

## 2017-11-22 DIAGNOSIS — Z8673 Personal history of transient ischemic attack (TIA), and cerebral infarction without residual deficits: Secondary | ICD-10-CM

## 2017-11-22 DIAGNOSIS — N186 End stage renal disease: Secondary | ICD-10-CM | POA: Diagnosis not present

## 2017-11-22 DIAGNOSIS — Z803 Family history of malignant neoplasm of breast: Secondary | ICD-10-CM

## 2017-11-22 DIAGNOSIS — E7800 Pure hypercholesterolemia, unspecified: Secondary | ICD-10-CM | POA: Diagnosis present

## 2017-11-22 DIAGNOSIS — Z9049 Acquired absence of other specified parts of digestive tract: Secondary | ICD-10-CM

## 2017-11-22 DIAGNOSIS — Z961 Presence of intraocular lens: Secondary | ICD-10-CM | POA: Diagnosis present

## 2017-11-22 LAB — COMPREHENSIVE METABOLIC PANEL
ALK PHOS: 60 U/L (ref 38–126)
ALT: 16 U/L — AB (ref 17–63)
AST: 26 U/L (ref 15–41)
Albumin: 3.7 g/dL (ref 3.5–5.0)
Anion gap: 12 (ref 5–15)
BUN: 29 mg/dL — AB (ref 6–20)
CHLORIDE: 88 mmol/L — AB (ref 101–111)
CO2: 35 mmol/L — ABNORMAL HIGH (ref 22–32)
CREATININE: 3.46 mg/dL — AB (ref 0.61–1.24)
Calcium: 8.5 mg/dL — ABNORMAL LOW (ref 8.9–10.3)
GFR calc Af Amer: 18 mL/min — ABNORMAL LOW (ref >60)
GFR, EST NON AFRICAN AMERICAN: 15 mL/min — AB (ref >60)
Glucose, Bld: 133 mg/dL — ABNORMAL HIGH (ref 65–99)
Potassium: 3.6 mmol/L (ref 3.5–5.1)
SODIUM: 135 mmol/L (ref 135–145)
Total Bilirubin: 1 mg/dL (ref 0.3–1.2)
Total Protein: 7.5 g/dL (ref 6.5–8.1)

## 2017-11-22 LAB — CBC WITH DIFFERENTIAL/PLATELET
BASOS ABS: 0 10*3/uL (ref 0.0–0.1)
Basophils Relative: 0 %
EOS PCT: 0 %
Eosinophils Absolute: 0 10*3/uL (ref 0.0–0.7)
HCT: 34.9 % — ABNORMAL LOW (ref 39.0–52.0)
HEMOGLOBIN: 10.6 g/dL — AB (ref 13.0–17.0)
LYMPHS PCT: 11 %
Lymphs Abs: 1 10*3/uL (ref 0.7–4.0)
MCH: 31.5 pg (ref 26.0–34.0)
MCHC: 30.4 g/dL (ref 30.0–36.0)
MCV: 103.9 fL — AB (ref 78.0–100.0)
Monocytes Absolute: 1.3 10*3/uL (ref 0.1–1.0)
Monocytes Relative: 15 %
NEUTROS PCT: 74 %
Neutro Abs: 6.6 10*3/uL (ref 1.7–7.7)
PLATELETS: 264 10*3/uL (ref 150–400)
RBC: 3.36 MIL/uL — AB (ref 4.22–5.81)
RDW: 18.4 % — ABNORMAL HIGH (ref 11.5–15.5)
WBC: 9 10*3/uL (ref 4.0–10.5)

## 2017-11-22 MED ORDER — OXYMETAZOLINE HCL 0.05 % NA SOLN
NASAL | Status: AC
  Filled 2017-11-22: qty 15

## 2017-11-22 MED ORDER — METHYLPREDNISOLONE SODIUM SUCC 125 MG IJ SOLR
125.0000 mg | Freq: Once | INTRAMUSCULAR | Status: AC
  Administered 2017-11-22: 125 mg via INTRAVENOUS
  Filled 2017-11-22: qty 2

## 2017-11-22 MED ORDER — IPRATROPIUM-ALBUTEROL 0.5-2.5 (3) MG/3ML IN SOLN
3.0000 mL | Freq: Once | RESPIRATORY_TRACT | Status: AC
  Administered 2017-11-22: 3 mL via RESPIRATORY_TRACT
  Filled 2017-11-22: qty 3

## 2017-11-22 MED ORDER — ALBUTEROL SULFATE (2.5 MG/3ML) 0.083% IN NEBU
5.0000 mg | INHALATION_SOLUTION | Freq: Once | RESPIRATORY_TRACT | Status: AC
  Administered 2017-11-22: 5 mg via RESPIRATORY_TRACT
  Filled 2017-11-22: qty 6

## 2017-11-22 NOTE — ED Triage Notes (Signed)
Called out for nosebleed >2 hours, when arrive on scene, patient had c/o of shortness of breath, O2-Sat in 80%, put on CPAP enroute to ED, patient seen at [facility] yesterday for same thing.

## 2017-11-22 NOTE — ED Provider Notes (Addendum)
Emergency Department Provider Note   I have reviewed the triage vital signs and the nursing notes.   HISTORY  Chief Complaint Shortness of Breath and Epistaxis   HPI Terry [name]. is a 83 y.o. male with multiple medical problems as documented below the presents to the emergency department today secondary to epistaxis and dyspnea.  Soundly patient had a couple hours of nosebleed but has been short of breath for a couple of days.  He was seen last night in the emergency room and diagnosed with bronchitis but apparently had been doing better before sent home.  Shortness of breath continued and has been using oxygen when does not normally use oxygen.  On EMS arrival patient's oxygen saturations were 81% so started on BiPAP and brought him here and patient asking to come off bipap.  No other associated or modifying symptoms.    Past Medical History:  Diagnosis Date  . Acute and chronic respiratory failure 10/22/2012  . Anxiety   . Arthritis    "hands, knees" (03/15/2017)  . Atrial fibrillation ([place])   . CAD (coronary artery disease) [place], [name]  . CHF (congestive heart failure) ([place]) 09/2012  . COPD (chronic obstructive pulmonary disease) ([place])   . ESRD (end stage renal disease) on dialysis [facility])    "MWF; [facility]" (03/15/2017)  . Flu 09/24/2016   influenza b  . HCAP (healthcare-associated pneumonia) 03/15/2017  . High cholesterol   . History of blood transfusion 2012   "related to bowel resection"  . Hypertension   . On home oxygen therapy    "1L typically at night" (03/15/2017)  . Osteoarthritis 10/22/2012  . Pneumonia April 2014; 09/2016  . Shortness of breath dyspnea   . Stroke [facility]) 2012   denies residual on 03/15/2017  . Stroke [facility]) ~ 2013   "small one in his right eye"    Patient Active Problem List   Diagnosis Date Noted  . Anemia   . Coronavirus infection 06/22/2017  . End stage renal disease on dialysis ([place])   . Hypoxia   .  COPD with acute exacerbation ([place]) 06/21/2017  . Sepsis ([place]) 06/21/2017  . Hypertension 03/15/2017  . Chest pain 03/15/2017  . High cholesterol 03/15/2017  . CKD (chronic kidney disease) stage V requiring chronic dialysis ([place]) 03/15/2017  . COPD (chronic obstructive pulmonary disease) GOLD stage III 03/15/2017  . Chronic hypoxemic respiratory failure ([place]) 03/15/2017  . PAF (paroxysmal atrial fibrillation) ([place]) 03/15/2017  . Acute on chronic respiratory failure with hypoxia ([place])   . Pulmonary infiltrates on CXR 02/10/2017  . Upper airway cough syndrome 08/13/2016  . Memory loss of unknown cause 04/08/2016  . Chronic respiratory failure with hypoxia ([place]) 08/27/2015  . ESRD (end stage renal disease) on dialysis ([place]) 07/28/2015  . HCAP (healthcare-associated pneumonia)   . Pneumothorax, left   . Chronic diastolic heart failure ([place])   . Anemia of chronic disease   . Pneumonia due to Enterobacter cloacae ([place])   . Pneumonia due to Klebsiella pneumoniae ([place])   . Chronic atrial fibrillation ([place])   . CAD (coronary artery disease) 02/04/2015  . Hyperkalemia 04/13/2014  . COPD GOLD IV 10/22/2012  . Other and unspecified hyperlipidemia 10/22/2012  . Osteoarthritis 10/22/2012  . Essential hypertension 10/22/2012    Past Surgical History:  Procedure Laterality Date  . APPENDECTOMY  1984  . AV FISTULA PLACEMENT Right 05/21/2015   Procedure: Right Arm Brachiocephalic ARTERIOVENOUS (AV) FISTULA CREATION;  Surgeon: Harrell Gave  Nicole Cella, MD;  Location: [place];  Service: Vascular;  Laterality: Right;  . CATARACT EXTRACTION W/ INTRAOCULAR LENS  IMPLANT, BILATERAL Bilateral   . COLECTOMY  2012   partial colon removed - twisted bowel  . CORONARY ANGIOPLASTY WITH STENT PLACEMENT  1999   2 stents  . CYSTOSCOPY/RETROGRADE/URETEROSCOPY Bilateral 04/23/2014   Procedure: CYSTOSCOPY BILATERAL RETROGRADE,  LEFT URETEROSCOPY, LEFT STENT PLACEMENT;  Surgeon: Raynelle Bring, MD;  Location: [facility];   Service: Urology;  Laterality: Bilateral;  . EYE SURGERY Left    "surgery to clean off lens after cataract OR"  . INGUINAL HERNIA REPAIR Right     Current Outpatient Rx  . Order #: [id number] Class: Historical Med  . Order #: [id number] Class: Print  . Order #: [id number] Class: Print  . Order #: [id number] Class: Historical Med  . Order #: [id number] Class: Normal  . Order #: [id number] Class: Print  . Order #: [id number] Class: Historical Med  . Order #: [id number] Class: Historical Med  . Order #: [id number] Class: Historical Med  . Order #: [id number] Class: Normal  . Order #: [id number] Class: Historical Med  . Order #: [id number] Class: Print  . Order #: [id number] Class: Historical Med  . Order #: [id number] Class: Historical Med  . Order #: [id number] Class: Historical Med  . Order #: [id number] Class: Historical Med  . Order #: [id number] Class: Historical Med  . Order #: [id number] Class: Historical Med  . Order #: [id number] Class: Historical Med  . Order #: [id number] Class: Print  . Order #: [id number] Class: Normal  . Order #: [id number] Class: Sample    Allergies Yellow dyes (non-tartrazine); Levaquin [levofloxacin in d5w]; and Levofloxacin  Family History  Problem Relation Age of Onset  . Cancer Mother        Breast  . Heart disease Father   . Heart attack Father   . Parkinson's disease Brother     Social History Social History   Tobacco Use  . Smoking status: Former Smoker    Packs/day: 1.00    Years: 49.00    Pack years: 49.00    Types: Cigarettes, Pipe, Cigars    Last attempt to quit: 02/21/2000    Years since quitting: 17.7  . Smokeless tobacco: Former Systems developer    Types: Chew  . Tobacco comment: "chewed when he was a kid"  Substance Use Topics  . Alcohol use: Yes    Comment: 03/15/2017 "nothing since the 1960's"  . Drug use: No    Review of Systems  All other systems negative except as documented in the HPI. All pertinent positives and negatives as reviewed in the  HPI. ____________________________________________   PHYSICAL EXAM:  VITAL SIGNS: ED Triage Vitals  Enc Vitals Group     BP 11/22/17 2159 123/60     Pulse Rate 11/22/17 2159 68     Resp 11/22/17 2159 (!) 22     Temp 11/22/17 2201 97.7 F (36.5 C)     Temp Source 11/22/17 2201 Oral     SpO2 11/22/17 2144 100 %     Weight 11/22/17 2140 155 lb (70.3 kg)     Height 11/22/17 2140 5\' 8"  (1.727 m)     Head Circumference --      Peak Flow --      Pain Score 11/22/17 2140 0     Pain Loc --      Pain Edu? --      Excl. in [place]? --     Constitutional: Alert and oriented. Well appearing and in no acute distress. Eyes: Conjunctivae are normal. PERRL. EOMI. Head:  Atraumatic. Nose: No congestion/rhinnorhea. Mouth/Throat: Mucous membranes are moist.  Oropharynx non-erythematous. Neck: No stridor.  No meningeal signs.   Cardiovascular: Normal rate, regular rhythm. Good peripheral circulation. Grossly normal heart sounds.   Respiratory: tachypneic respiratory effort.  No retractions. Lungs with wheezing and crackles. Gastrointestinal: Soft and nontender. No distention.  Musculoskeletal: No lower extremity tenderness but does have mild edema. No gross deformities of extremities. Neurologic:  Normal speech and language. No gross focal neurologic deficits are appreciated.  Skin:  Skin is warm, dry and intact. No rash noted.  ____________________________________________   LABS (all labs ordered are listed, but only abnormal results are displayed)  Labs Reviewed  CBC WITH DIFFERENTIAL/PLATELET - Abnormal; Notable for the following components:      Result Value   RBC 3.36 (*)    Hemoglobin 10.6 (*)    HCT 34.9 (*)    MCV 103.9 (*)    RDW 18.4 (*)    All other components within normal limits  COMPREHENSIVE METABOLIC PANEL - Abnormal; Notable for the following components:   Chloride 88 (*)    CO2 35 (*)    Glucose, Bld 133 (*)    BUN 29 (*)    Creatinine, Ser 3.46 (*)    Calcium 8.5 (*)     ALT 16 (*)    GFR calc non Af Amer 15 (*)    GFR calc Af Amer 18 (*)    All other components within normal limits  BRAIN NATRIURETIC PEPTIDE - Abnormal; Notable for the following components:   B Natriuretic Peptide 659.0 (*)    All other components within normal limits  TROPONIN I   ____________________________________________  EKG   EKG Interpretation  Date/Time:  Monday November 22 2017 21:52:32 EDT Ventricular Rate:  92 PR Interval:    QRS Duration: 78 QT Interval:  334 QTC Calculation: 414 R Axis:   57 Text Interpretation:  Atrial fibrillation Borderline repolarization abnormality No significant change since last tracing Confirmed by Merrily Pew [phone]) on 11/22/2017 11:13:46 PM      ____________________________________________  RADIOLOGY  Dg Chest Port 1 View  Result Date: 11/22/2017 CLINICAL DATA:  Shortness of breath. EXAM: PORTABLE CHEST 1 VIEW COMPARISON:  Frontal and lateral views yesterday. FINDINGS: Stable hyperinflation. Bibasilar atelectasis or scarring. Unchanged cardiomegaly and mediastinal contours. No pulmonary edema, confluent airspace disease or large pleural effusion. No pneumothorax. IMPRESSION: Similar bibasilar atelectasis or scarring.  No new abnormality. Electronically Signed   By: Jeb Levering M.D.   On: 11/22/2017 22:34    ____________________________________________   PROCEDURES  Procedure(s) performed:   .Epistaxis Management Date/Time: 11/23/2017 12:21 AM Performed by: Merrily Pew, MD Authorized by: Merrily Pew, MD   Consent:    Consent obtained:  Verbal   Consent given by:  Patient   Risks discussed:  Bleeding and infection Anesthesia (see MAR for exact dosages):    Anesthesia method:  None Procedure details:    Treatment site:  R anterior   Repair method: afrin and pressure.   Treatment complexity:  Limited   Treatment episode: initial   Post-procedure details:    Assessment:  Bleeding stopped   Patient tolerance of  procedure:  Tolerated well, no immediate complications  .Critical Care Performed by: Merrily Pew, MD Authorized by: Merrily Pew, MD   Critical care provider statement:    Critical care time (minutes):  32   Critical care time was exclusive of:  Separately billable procedures and treating other patients   Critical care was necessary to  treat or prevent imminent or life-threatening deterioration of the following conditions:  Respiratory failure   Critical care was time spent personally by me on the following activities:  Development of treatment plan with patient or surrogate, evaluation of patient's response to treatment, examination of patient, obtaining history from patient or surrogate, review of old charts, re-evaluation of patient's condition, pulse oximetry, ordering and review of radiographic studies, ordering and review of laboratory studies and ordering and performing treatments and interventions   I assumed direction of critical care for this patient from another provider in my specialty: no       ____________________________________________   INITIAL IMPRESSION / ASSESSMENT AND PLAN / ED COURSE  Likely copd exacerbation worsening. Given multiple breathing treatments, will plan for observation for same.   Nosebleed improved with afrin/pressure. No need for packing. May be realated to increased use of [place] oxygen.    Pertinent labs & imaging results that were available during my care of the patient were reviewed by me and considered in my medical decision making (see chart for details).  ____________________________________________  FINAL CLINICAL IMPRESSION(S) / ED DIAGNOSES  Final diagnoses:  SOB (shortness of breath)     MEDICATIONS GIVEN DURING THIS VISIT:  Medications  oxymetazoline (AFRIN) 0.05 % nasal spray (has no administration in time range)  albuterol (PROVENTIL,VENTOLIN) solution continuous neb (has no administration in time range)  albuterol  (PROVENTIL) (2.5 MG/3ML) 0.083% nebulizer solution 5 mg (5 mg Nebulization Given 11/22/17 2201)  methylPREDNISolone sodium succinate (SOLU-MEDROL) 125 mg/2 mL injection 125 mg (125 mg Intravenous Given 11/22/17 2303)  ipratropium-albuterol (DUONEB) 0.5-2.5 (3) MG/3ML nebulizer solution 3 mL (3 mLs Nebulization Given 11/22/17 2347)     NEW OUTPATIENT MEDICATIONS STARTED DURING THIS VISIT:  New Prescriptions   No medications on file    Note:  This note was prepared with assistance of Dragon voice recognition software. Occasional wrong-word or sound-a-like substitutions may have occurred due to the inherent limitations of voice recognition software.   Oveda Dadamo, Corene Cornea, MD 11/23/17 Sharl Ma    Merrily Pew, MD 11/23/17 [phone]

## 2017-11-22 NOTE — Progress Notes (Signed)
Dr. Dayna Barker removed pt from BIPAP. BIPAP on standby at bedside

## 2017-11-23 ENCOUNTER — Encounter (HOSPITAL_COMMUNITY): Payer: Self-pay | Admitting: *Deleted

## 2017-11-23 ENCOUNTER — Inpatient Hospital Stay (HOSPITAL_COMMUNITY): Admission: RE | Admit: 2017-11-23 | Payer: Medicare PPO | Source: Ambulatory Visit

## 2017-11-23 ENCOUNTER — Other Ambulatory Visit: Payer: Self-pay

## 2017-11-23 DIAGNOSIS — R0602 Shortness of breath: Secondary | ICD-10-CM | POA: Diagnosis present

## 2017-11-23 DIAGNOSIS — N186 End stage renal disease: Secondary | ICD-10-CM | POA: Diagnosis present

## 2017-11-23 DIAGNOSIS — D631 Anemia in chronic kidney disease: Secondary | ICD-10-CM | POA: Diagnosis present

## 2017-11-23 DIAGNOSIS — J441 Chronic obstructive pulmonary disease with (acute) exacerbation: Secondary | ICD-10-CM | POA: Diagnosis present

## 2017-11-23 DIAGNOSIS — Z9842 Cataract extraction status, left eye: Secondary | ICD-10-CM | POA: Diagnosis not present

## 2017-11-23 DIAGNOSIS — E785 Hyperlipidemia, unspecified: Secondary | ICD-10-CM | POA: Diagnosis present

## 2017-11-23 DIAGNOSIS — I480 Paroxysmal atrial fibrillation: Secondary | ICD-10-CM | POA: Diagnosis present

## 2017-11-23 DIAGNOSIS — J9601 Acute respiratory failure with hypoxia: Secondary | ICD-10-CM

## 2017-11-23 DIAGNOSIS — Z8673 Personal history of transient ischemic attack (TIA), and cerebral infarction without residual deficits: Secondary | ICD-10-CM | POA: Diagnosis not present

## 2017-11-23 DIAGNOSIS — J9600 Acute respiratory failure, unspecified whether with hypoxia or hypercapnia: Secondary | ICD-10-CM | POA: Diagnosis present

## 2017-11-23 DIAGNOSIS — N2581 Secondary hyperparathyroidism of renal origin: Secondary | ICD-10-CM | POA: Diagnosis present

## 2017-11-23 DIAGNOSIS — I5032 Chronic diastolic (congestive) heart failure: Secondary | ICD-10-CM | POA: Diagnosis present

## 2017-11-23 DIAGNOSIS — I2510 Atherosclerotic heart disease of native coronary artery without angina pectoris: Secondary | ICD-10-CM | POA: Diagnosis present

## 2017-11-23 DIAGNOSIS — I132 Hypertensive heart and chronic kidney disease with heart failure and with stage 5 chronic kidney disease, or end stage renal disease: Secondary | ICD-10-CM | POA: Diagnosis present

## 2017-11-23 DIAGNOSIS — Z9841 Cataract extraction status, right eye: Secondary | ICD-10-CM | POA: Diagnosis not present

## 2017-11-23 DIAGNOSIS — M19041 Primary osteoarthritis, right hand: Secondary | ICD-10-CM | POA: Diagnosis present

## 2017-11-23 DIAGNOSIS — J440 Chronic obstructive pulmonary disease with acute lower respiratory infection: Secondary | ICD-10-CM | POA: Diagnosis present

## 2017-11-23 DIAGNOSIS — I248 Other forms of acute ischemic heart disease: Secondary | ICD-10-CM | POA: Diagnosis present

## 2017-11-23 DIAGNOSIS — Z9981 Dependence on supplemental oxygen: Secondary | ICD-10-CM | POA: Diagnosis not present

## 2017-11-23 DIAGNOSIS — R040 Epistaxis: Secondary | ICD-10-CM | POA: Diagnosis present

## 2017-11-23 DIAGNOSIS — Z992 Dependence on renal dialysis: Secondary | ICD-10-CM | POA: Diagnosis not present

## 2017-11-23 DIAGNOSIS — J9621 Acute and chronic respiratory failure with hypoxia: Secondary | ICD-10-CM | POA: Diagnosis present

## 2017-11-23 DIAGNOSIS — M170 Bilateral primary osteoarthritis of knee: Secondary | ICD-10-CM | POA: Diagnosis present

## 2017-11-23 DIAGNOSIS — E7800 Pure hypercholesterolemia, unspecified: Secondary | ICD-10-CM | POA: Diagnosis present

## 2017-11-23 DIAGNOSIS — Z961 Presence of intraocular lens: Secondary | ICD-10-CM | POA: Diagnosis present

## 2017-11-23 DIAGNOSIS — Z9049 Acquired absence of other specified parts of digestive tract: Secondary | ICD-10-CM | POA: Diagnosis not present

## 2017-11-23 DIAGNOSIS — M19042 Primary osteoarthritis, left hand: Secondary | ICD-10-CM | POA: Diagnosis present

## 2017-11-23 LAB — COMPREHENSIVE METABOLIC PANEL
ALK PHOS: 54 U/L (ref 38–126)
ALT: 17 U/L (ref 17–63)
AST: 25 U/L (ref 15–41)
Albumin: 3.6 g/dL (ref 3.5–5.0)
Anion gap: 13 (ref 5–15)
BILIRUBIN TOTAL: 0.8 mg/dL (ref 0.3–1.2)
BUN: 37 mg/dL — AB (ref 6–20)
CALCIUM: 8.5 mg/dL — AB (ref 8.9–10.3)
CO2: 31 mmol/L (ref 22–32)
Chloride: 95 mmol/L — ABNORMAL LOW (ref 101–111)
Creatinine, Ser: 4.02 mg/dL — ABNORMAL HIGH (ref 0.61–1.24)
GFR calc Af Amer: 15 mL/min — ABNORMAL LOW (ref >60)
GFR calc non Af Amer: 13 mL/min — ABNORMAL LOW (ref >60)
GLUCOSE: 217 mg/dL — AB (ref 65–99)
Potassium: 3.7 mmol/L (ref 3.5–5.1)
Sodium: 139 mmol/L (ref 135–145)
TOTAL PROTEIN: 7 g/dL (ref 6.5–8.1)

## 2017-11-23 LAB — MRSA PCR SCREENING: MRSA BY PCR: POSITIVE — AB

## 2017-11-23 LAB — TROPONIN I
TROPONIN I: 0.03 ng/mL — AB (ref <0.03)
Troponin I: 0.04 ng/mL (ref <0.03)
Troponin I: 0.04 ng/mL (ref <0.03)
Troponin I: 0.03 ng/mL (ref <0.03)

## 2017-11-23 LAB — CBC
HEMATOCRIT: 32.2 % — AB (ref 39.0–52.0)
HEMOGLOBIN: 9.8 g/dL — AB (ref 13.0–17.0)
MCH: 31.9 pg (ref 26.0–34.0)
MCHC: 30.4 g/dL (ref 30.0–36.0)
MCV: 104.9 fL — AB (ref 78.0–100.0)
Platelets: 252 10*3/uL (ref 150–400)
RBC: 3.07 MIL/uL — ABNORMAL LOW (ref 4.22–5.81)
RDW: 18.4 % — ABNORMAL HIGH (ref 11.5–15.5)
WBC: 9.9 10*3/uL (ref 4.0–10.5)

## 2017-11-23 LAB — BRAIN NATRIURETIC PEPTIDE: B Natriuretic Peptide: 659 pg/mL — ABNORMAL HIGH (ref 0.0–100.0)

## 2017-11-23 MED ORDER — MOMETASONE FURO-FORMOTEROL FUM 200-5 MCG/ACT IN AERO
2.0000 | INHALATION_SPRAY | Freq: Two times a day (BID) | RESPIRATORY_TRACT | Status: DC
  Filled 2017-11-23: qty 8.8

## 2017-11-23 MED ORDER — LORAZEPAM 2 MG/ML IJ SOLN
1.0000 mg | Freq: Once | INTRAMUSCULAR | Status: AC
  Administered 2017-11-23: 1 mg via INTRAVENOUS

## 2017-11-23 MED ORDER — OXYMETAZOLINE HCL 0.05 % NA SOLN
1.0000 | Freq: Once | NASAL | Status: DC | PRN
  Filled 2017-11-23: qty 15

## 2017-11-23 MED ORDER — CLOPIDOGREL BISULFATE 75 MG PO TABS
75.0000 mg | ORAL_TABLET | Freq: Every day | ORAL | Status: DC
  Administered 2017-11-23 – 2017-11-27 (×5): 75 mg via ORAL
  Filled 2017-11-23 (×5): qty 1

## 2017-11-23 MED ORDER — ONDANSETRON HCL 4 MG/2ML IJ SOLN: 4.0000 mg | Freq: Four times a day (QID) | INTRAMUSCULAR | Status: DC | PRN

## 2017-11-23 MED ORDER — METOPROLOL TARTRATE 5 MG/5ML IV SOLN
INTRAVENOUS | Status: AC
  Filled 2017-11-23: qty 5

## 2017-11-23 MED ORDER — EPOETIN ALFA 2000 UNIT/ML IJ SOLN
2000.0000 [IU] | INTRAMUSCULAR | Status: DC
  Administered 2017-11-24 – 2017-11-26 (×2): 2000 [IU] via INTRAVENOUS
  Filled 2017-11-23 (×3): qty 1

## 2017-11-23 MED ORDER — SODIUM CHLORIDE 0.9 % IV SOLN: 100.0000 mL | INTRAVENOUS | Status: DC | PRN

## 2017-11-23 MED ORDER — HYDRALAZINE HCL 20 MG/ML IJ SOLN: 10.0000 mg | Freq: Four times a day (QID) | INTRAMUSCULAR | Status: DC | PRN

## 2017-11-23 MED ORDER — LIDOCAINE HCL (PF) 1 % IJ SOLN: 5.0000 mL | INTRAMUSCULAR | Status: DC | PRN

## 2017-11-23 MED ORDER — CHLORHEXIDINE GLUCONATE CLOTH 2 % EX PADS
6.0000 | MEDICATED_PAD | Freq: Every day | CUTANEOUS | Status: DC
  Administered 2017-11-23 – 2017-11-27 (×3): 6 via TOPICAL

## 2017-11-23 MED ORDER — CALCIUM ACETATE (PHOS BINDER) 667 MG PO CAPS
667.0000 mg | ORAL_CAPSULE | Freq: Three times a day (TID) | ORAL | Status: DC
  Administered 2017-11-23 – 2017-11-27 (×12): 667 mg via ORAL
  Filled 2017-11-23 (×12): qty 1

## 2017-11-23 MED ORDER — LORAZEPAM 2 MG/ML IJ SOLN
INTRAMUSCULAR | Status: AC
  Filled 2017-11-23: qty 1

## 2017-11-23 MED ORDER — LEVALBUTEROL HCL 0.63 MG/3ML IN NEBU
0.6300 mg | INHALATION_SOLUTION | Freq: Four times a day (QID) | RESPIRATORY_TRACT | Status: DC | PRN
  Administered 2017-11-24: 0.63 mg via RESPIRATORY_TRACT
  Filled 2017-11-23: qty 3

## 2017-11-23 MED ORDER — PENTAFLUOROPROP-TETRAFLUOROETH EX AERO
1.0000 "application " | INHALATION_SPRAY | CUTANEOUS | Status: DC | PRN
  Filled 2017-11-23: qty 30

## 2017-11-23 MED ORDER — SODIUM CHLORIDE 0.9 % IV SOLN: 250.0000 mL | INTRAVENOUS | Status: DC | PRN

## 2017-11-23 MED ORDER — METOPROLOL TARTRATE 5 MG/5ML IV SOLN
5.0000 mg | Freq: Once | INTRAVENOUS | Status: AC
  Administered 2017-11-23: 5 mg via INTRAVENOUS

## 2017-11-23 MED ORDER — ATORVASTATIN CALCIUM 40 MG PO TABS
80.0000 mg | ORAL_TABLET | Freq: Every day | ORAL | Status: DC
  Administered 2017-11-23 – 2017-11-27 (×5): 80 mg via ORAL
  Filled 2017-11-23 (×5): qty 2

## 2017-11-23 MED ORDER — LEVALBUTEROL HCL 0.63 MG/3ML IN NEBU
0.6300 mg | INHALATION_SOLUTION | Freq: Four times a day (QID) | RESPIRATORY_TRACT | Status: DC
  Administered 2017-11-23 (×3): 0.63 mg via RESPIRATORY_TRACT
  Filled 2017-11-23 (×3): qty 3

## 2017-11-23 MED ORDER — METOPROLOL SUCCINATE ER 50 MG PO TB24
50.0000 mg | ORAL_TABLET | Freq: Two times a day (BID) | ORAL | Status: DC
  Administered 2017-11-23 – 2017-11-27 (×7): 50 mg via ORAL
  Filled 2017-11-23 (×8): qty 1

## 2017-11-23 MED ORDER — LIDOCAINE-PRILOCAINE 2.5-2.5 % EX CREA
1.0000 "application " | TOPICAL_CREAM | CUTANEOUS | Status: DC | PRN
  Filled 2017-11-23: qty 5

## 2017-11-23 MED ORDER — IPRATROPIUM BROMIDE 0.02 % IN SOLN: 0.5000 mg | Freq: Three times a day (TID) | RESPIRATORY_TRACT | Status: DC

## 2017-11-23 MED ORDER — LORATADINE 10 MG PO TABS
10.0000 mg | ORAL_TABLET | Freq: Every day | ORAL | Status: DC
Start: 2017-11-23 — End: 2017-11-27
  Administered 2017-11-23 – 2017-11-27 (×5): 10 mg via ORAL
  Filled 2017-11-23 (×5): qty 1

## 2017-11-23 MED ORDER — IPRATROPIUM BROMIDE 0.02 % IN SOLN
0.5000 mg | Freq: Four times a day (QID) | RESPIRATORY_TRACT | Status: DC
  Administered 2017-11-23 (×3): 0.5 mg via RESPIRATORY_TRACT
  Filled 2017-11-23 (×3): qty 2.5

## 2017-11-23 MED ORDER — ALBUTEROL (5 MG/ML) CONTINUOUS INHALATION SOLN
10.0000 mg/h | INHALATION_SOLUTION | RESPIRATORY_TRACT | Status: DC
  Administered 2017-11-23: 10 mg/h via RESPIRATORY_TRACT
  Filled 2017-11-23: qty 20

## 2017-11-23 MED ORDER — LEVALBUTEROL HCL 0.63 MG/3ML IN NEBU: 0.6300 mg | INHALATION_SOLUTION | Freq: Three times a day (TID) | RESPIRATORY_TRACT | Status: DC

## 2017-11-23 MED ORDER — METHYLPREDNISOLONE SODIUM SUCC 125 MG IJ SOLR
60.0000 mg | Freq: Four times a day (QID) | INTRAMUSCULAR | Status: DC
  Administered 2017-11-23 – 2017-11-26 (×14): 60 mg via INTRAVENOUS
  Filled 2017-11-23 (×14): qty 2

## 2017-11-23 MED ORDER — ONDANSETRON HCL 4 MG PO TABS
4.0000 mg | ORAL_TABLET | Freq: Four times a day (QID) | ORAL | Status: DC | PRN
  Administered 2017-11-23: 4 mg via ORAL
  Filled 2017-11-23: qty 1

## 2017-11-23 MED ORDER — SODIUM CHLORIDE 0.9% FLUSH
3.0000 mL | Freq: Two times a day (BID) | INTRAVENOUS | Status: DC
  Administered 2017-11-23 – 2017-11-27 (×9): 3 mL via INTRAVENOUS

## 2017-11-23 MED ORDER — EPOETIN ALFA 2000 UNIT/ML IJ SOLN
2000.0000 [IU] | INTRAMUSCULAR | Status: DC
  Filled 2017-11-23: qty 1

## 2017-11-23 MED ORDER — ASPIRIN EC 81 MG PO TBEC
81.0000 mg | DELAYED_RELEASE_TABLET | Freq: Every day | ORAL | Status: DC
  Administered 2017-11-23 – 2017-11-27 (×5): 81 mg via ORAL
  Filled 2017-11-23 (×5): qty 1

## 2017-11-23 MED ORDER — GUAIFENESIN-DM 100-10 MG/5ML PO SYRP
5.0000 mL | ORAL_SOLUTION | ORAL | Status: DC | PRN
  Administered 2017-11-24 – 2017-11-26 (×3): 5 mL via ORAL
  Filled 2017-11-23 (×3): qty 5

## 2017-11-23 MED ORDER — DOXYCYCLINE HYCLATE 100 MG PO TABS
100.0000 mg | ORAL_TABLET | Freq: Two times a day (BID) | ORAL | Status: AC
  Administered 2017-11-23 – 2017-11-25 (×6): 100 mg via ORAL
  Filled 2017-11-23 (×6): qty 1

## 2017-11-23 MED ORDER — BUDESONIDE 0.5 MG/2ML IN SUSP
0.5000 mg | Freq: Two times a day (BID) | RESPIRATORY_TRACT | Status: DC
  Administered 2017-11-23 – 2017-11-27 (×7): 0.5 mg via RESPIRATORY_TRACT
  Filled 2017-11-23 (×7): qty 2

## 2017-11-23 MED ORDER — MUPIROCIN 2 % EX OINT
1.0000 "application " | TOPICAL_OINTMENT | Freq: Two times a day (BID) | CUTANEOUS | Status: DC
  Administered 2017-11-23 – 2017-11-27 (×9): 1 via NASAL
  Filled 2017-11-23 (×3): qty 22

## 2017-11-23 MED ORDER — SODIUM CHLORIDE 0.9% FLUSH: 3.0000 mL | INTRAVENOUS | Status: DC | PRN

## 2017-11-23 MED ORDER — CHLORHEXIDINE GLUCONATE CLOTH 2 % EX PADS
6.0000 | MEDICATED_PAD | Freq: Every day | CUTANEOUS | Status: DC
  Administered 2017-11-24 – 2017-11-27 (×2): 6 via TOPICAL

## 2017-11-23 NOTE — Progress Notes (Signed)
Pt became distressed when trying to lay back on the bed. Pt's wife increased his oxygen to 3L and called for the RN. Pt SOB, low O2 sats, increased HR, increased RR. Pt agreed to BIPAP after some encouragement.

## 2017-11-23 NOTE — Care Management Note (Addendum)
Case Management Note  Patient Details  Name: Terry [name]. MRN: [id number] Date of Birth: 14-Jan-1935  Subjective/Objective:   Adm with acute respiratory failure. ESRD on dialysis, goes to [facility].  From home,  reports pt has neb machine, RW,  Cane, WC, bedside commode and oxygen ([facility]) at home. Has PCP-still drives. No issues affording medication.   Action/Plan:Will continue to follow for dc needs. Anticipate DC home with self care.      Expected Discharge Date:  11/26/17               Expected Discharge Plan:     In-House Referral:     Discharge planning Services  CM Consult  Post Acute Care Choice:    Choice offered to:     DME Arranged:    DME Agency:     HH Arranged:    HH Agency:     Status of Service:  In process, will continue to follow  If discussed at Long Length of Stay Meetings, dates discussed:    Additional Comments:  Drianna Chandran, Chauncey Reading, RN 11/23/2017, 1:53 PM

## 2017-11-23 NOTE — Progress Notes (Signed)
Patient seen and examined.  Admitted after midnight secondary to increased shortness of breath and hypoxia.  Work-up has demonstrated vascular congestion in his lungs with mild interstitial edema and COPD exacerbation.  Patient is chronically on oxygen supplementation but in the requiring BiPAP on presentation to the hospital due to shortness of breath.  He is more stable now, denying chest pain and breathing easier.  Plan is to wean him off BiPAP, continue treatment for COPD exacerbation and arrange with renal services to provide hemodialysis most likely tomorrow which is his scheduled day.  Follow daily weights, renal diet, strict intake and output.   Refer to H&P as written by Dr. Darrick Meigs for further info/details on admission.  Barton Dubois MD [phone]

## 2017-11-23 NOTE — Consult Note (Signed)
Reason for Consult:End-stage renal disease Referring Physician: Dr. Tildon Husky. is an 83 y.o. male.  HPI: He is a patient who has history of coronary artery disease, CVA, COPD and end-stage renal disease on maintenance hemodialysis presently came to the emergency room with complaints of difficulty breathing and some cough.  According to the patient she was seen at [facility] for the same problem.  Patient was found to have bronchitis and discharged home.  He was dialyzed yesterday and we are able to remove about 1-1/2 L.  However patient continued to have difficulty breathing and was brought to the hospital.  Presently he is on nonrebreather mask and feels better.  Patient denies any fever, chills or sweating.  Past Medical History:  Diagnosis Date  . Acute and chronic respiratory failure 10/22/2012  . Anxiety   . Arthritis    "hands, knees" (03/15/2017)  . Atrial fibrillation ([place])   . CAD (coronary artery disease) [place], [name]  . CHF (congestive heart failure) ([place]) 09/2012  . COPD (chronic obstructive pulmonary disease) ([place])   . ESRD (end stage renal disease) on dialysis [facility])    "MWF; [facility] [place]" (03/15/2017)  . Flu 09/24/2016   influenza b  . HCAP (healthcare-associated pneumonia) 03/15/2017  . High cholesterol   . History of blood transfusion 2012   "related to bowel resection"  . Hypertension   . On home oxygen therapy    "1L typically at night" (03/15/2017)  . Osteoarthritis 10/22/2012  . Pneumonia April 2014; 09/2016  . Shortness of breath dyspnea   . Stroke [facility]) 2012   denies residual on 03/15/2017  . Stroke [facility]) ~ 2013   "small one in his right eye"    Past Surgical History:  Procedure Laterality Date  . APPENDECTOMY  1984  . AV FISTULA PLACEMENT Right 05/21/2015   Procedure: Right Arm Brachiocephalic ARTERIOVENOUS (AV) FISTULA CREATION;  Surgeon: Angelia Mould, MD;  Location: [place];  Service: Vascular;   Laterality: Right;  . CATARACT EXTRACTION W/ INTRAOCULAR LENS  IMPLANT, BILATERAL Bilateral   . COLECTOMY  2012   partial colon removed - twisted bowel  . CORONARY ANGIOPLASTY WITH STENT PLACEMENT  1999   2 stents  . CYSTOSCOPY/RETROGRADE/URETEROSCOPY Bilateral 04/23/2014   Procedure: CYSTOSCOPY BILATERAL RETROGRADE,  LEFT URETEROSCOPY, LEFT STENT PLACEMENT;  Surgeon: Raynelle Bring, MD;  [facility];  Service: Urology;  Laterality: Bilateral;  . EYE SURGERY Left    "surgery to clean off lens after cataract OR"  . INGUINAL HERNIA REPAIR Right     Family History  Problem Relation Age of Onset  . Cancer Mother        Breast  . Heart disease Father   . Heart attack Father   . Parkinson's disease Brother     Social History:  reports that he quit smoking about 17 years ago. His smoking use included cigarettes, pipe, and cigars. He has a 49.00 pack-year smoking history. He has quit using smokeless tobacco. His smokeless tobacco use included chew. He reports that he drinks alcohol. He reports that he does not use drugs.  Allergies:  Allergies  Allergen Reactions  . Yellow Dyes (Non-Tartrazine) Other (See Comments)    Burns arms Cough medications (tussionex and benzonatate ok)  . Levaquin [Levofloxacin In D5w] Itching  . Levofloxacin Nausea And Vomiting    Medications: I have reviewed the patient's current medications.  Results for orders placed or performed during the hospital encounter  of 11/22/17 (from the past 48 hour(s))  CBC with Differential     Status: Abnormal   Collection Time: 11/22/17  9:45 PM  Result Value Ref Range   WBC 9.0 4.0 - 10.5 K/uL    Comment: WHITE COUNT CONFIRMED ON SMEAR ATYPICAL LYMPHOCYTES    RBC 3.36 (L) 4.22 - 5.81 MIL/uL   Hemoglobin 10.6 (L) 13.0 - 17.0 g/dL   HCT 34.9 (L) 39.0 - 52.0 %   MCV 103.9 (H) 78.0 - 100.0 fL   MCH 31.5 26.0 - 34.0 pg   MCHC 30.4 30.0 - 36.0 g/dL   RDW 18.4 (H) 11.5 - 15.5 %   Platelets 264 150 - 400 K/uL    Neutrophils Relative % 74 %   Neutro Abs 6.6 1.7 - 7.7 K/uL   Lymphocytes Relative 11 %   Lymphs Abs 1.0 0.7 - 4.0 K/uL   Monocytes Relative 15 %   Monocytes Absolute 1.3 0.1 - 1.0 K/uL   Eosinophils Relative 0 %   Eosinophils Absolute 0.0 0.0 - 0.7 K/uL   Basophils Relative 0 %   Basophils Absolute 0.0 0.0 - 0.1 K/uL    Comment: Performed at [facility], [address], [place], [place] [address]  Comprehensive metabolic panel     Status: Abnormal   Collection Time: 11/22/17  9:45 PM  Result Value Ref Range   Sodium 135 135 - 145 mmol/L   Potassium 3.6 3.5 - 5.1 mmol/L   Chloride 88 (L) 101 - 111 mmol/L   CO2 35 (H) 22 - 32 mmol/L   Glucose, Bld 133 (H) 65 - 99 mg/dL   BUN 29 (H) 6 - 20 mg/dL   Creatinine, Ser 3.46 (H) 0.61 - 1.24 mg/dL   Calcium 8.5 (L) 8.9 - 10.3 mg/dL   Total Protein 7.5 6.5 - 8.1 g/dL   Albumin 3.7 3.5 - 5.0 g/dL   AST 26 15 - 41 U/L   ALT 16 (L) 17 - 63 U/L   Alkaline Phosphatase 60 38 - 126 U/L   Total Bilirubin 1.0 0.3 - 1.2 mg/dL   GFR calc non Af Amer 15 (L) >60 mL/min   GFR calc Af Amer 18 (L) >60 mL/min    Comment: (NOTE) The eGFR has been calculated using the CKD EPI equation. This calculation has not been validated in all clinical situations. eGFR's persistently <60 mL/min signify possible Chronic Kidney Disease.    Anion gap 12 5 - 15    Comment: Performed at [facility], [address] [place], [place] [address]  Troponin I     Status: Abnormal   Collection Time: 11/22/17  9:45 PM  Result Value Ref Range   Troponin I 0.04 (HH) <0.03 ng/mL    Comment: CRITICAL RESULT CALLED TO, READ BACK BY AND VERIFIED WITH: NICHOLS,K @ 0630 ON 6.4.19 BY BOWMAN,L Performed at [facility], [address] [place], [place] [address]   Brain natriuretic peptide     Status: Abnormal   Collection Time: 11/22/17  9:45 PM  Result Value Ref Range   B Natriuretic Peptide 659.0 (H) 0.0 - 100.0 pg/mL    Comment: Performed at [facility], [address],  [place], [place] [address]  Troponin I (q 6hr x 3)     Status: Abnormal   Collection Time: 11/23/17  4:15 AM  Result Value Ref Range   Troponin I 0.04 (HH) <0.03 ng/mL    Comment: CRITICAL VALUE NOTED.  VALUE IS CONSISTENT WITH PREVIOUSLY REPORTED AND CALLED  VALUE. Performed at [facility], [address] [place], [place] [address]   Comprehensive metabolic panel     Status: Abnormal   Collection Time: 11/23/17  4:15 AM  Result Value Ref Range   Sodium 139 135 - 145 mmol/L   Potassium 3.7 3.5 - 5.1 mmol/L   Chloride 95 (L) 101 - 111 mmol/L   CO2 31 22 - 32 mmol/L   Glucose, Bld 217 (H) 65 - 99 mg/dL   BUN 37 (H) 6 - 20 mg/dL   Creatinine, Ser 4.02 (H) 0.61 - 1.24 mg/dL   Calcium 8.5 (L) 8.9 - 10.3 mg/dL   Total Protein 7.0 6.5 - 8.1 g/dL   Albumin 3.6 3.5 - 5.0 g/dL   AST 25 15 - 41 U/L   ALT 17 17 - 63 U/L   Alkaline Phosphatase 54 38 - 126 U/L   Total Bilirubin 0.8 0.3 - 1.2 mg/dL   GFR calc non Af Amer 13 (L) >60 mL/min   GFR calc Af Amer 15 (L) >60 mL/min    Comment: (NOTE) The eGFR has been calculated using the CKD EPI equation. This calculation has not been validated in all clinical situations. eGFR's persistently <60 mL/min signify possible Chronic Kidney Disease.    Anion gap 13 5 - 15    Comment: Performed at [facility], [address], [place], [place] [address]  CBC     Status: Abnormal   Collection Time: 11/23/17  4:34 AM  Result Value Ref Range   WBC 9.9 4.0 - 10.5 K/uL   RBC 3.07 (L) 4.22 - 5.81 MIL/uL   Hemoglobin 9.8 (L) 13.0 - 17.0 g/dL   HCT 32.2 (L) 39.0 - 52.0 %   MCV 104.9 (H) 78.0 - 100.0 fL   MCH 31.9 26.0 - 34.0 pg   MCHC 30.4 30.0 - 36.0 g/dL   RDW 18.4 (H) 11.5 - 15.5 %   Platelets 252 150 - 400 K/uL    Comment: Performed at [facility], [address] [place], [place] [address]    Dg Chest 2 View  Result Date: 11/21/2017 CLINICAL DATA:  Shortness of breath and coughing. EXAM: CHEST - 2 VIEW COMPARISON:  July 16, 2017 FINDINGS: Atelectasis  in the bases. No pneumothorax. Cardiomegaly. No other abnormalities. IMPRESSION: No active cardiopulmonary disease. Electronically Signed   By: Dorise Bullion III M.D   On: 11/21/2017 13:46   Dg Chest Port 1 View  Result Date: 11/22/2017 CLINICAL DATA:  Shortness of breath. EXAM: PORTABLE CHEST 1 VIEW COMPARISON:  Frontal and lateral views yesterday. FINDINGS: Stable hyperinflation. Bibasilar atelectasis or scarring. Unchanged cardiomegaly and mediastinal contours. No pulmonary edema, confluent airspace disease or large pleural effusion. No pneumothorax. IMPRESSION: Similar bibasilar atelectasis or scarring.  No new abnormality. Electronically Signed   By: Jeb Levering M.D.   On: 11/22/2017 22:34    Review of Systems  Constitutional: Positive for weight loss. Negative for chills and fever.  Respiratory: Positive for cough and shortness of breath. Negative for sputum production.   Cardiovascular: Negative for chest pain and orthopnea.  Gastrointestinal: Negative for nausea and vomiting.   Blood pressure (!) 142/130, pulse (!) 107, temperature (!) 97.4 F (36.3 C), resp. rate 19, height 5' 8"  (1.727 m), weight 67.4 kg (148 lb 9.4 oz), SpO2 96 %. Physical Exam  Constitutional: He is oriented to person, place, and time. No distress.  Eyes: No scleral icterus.  Neck: No JVD present.  Cardiovascular: Normal rate and regular rhythm.  Respiratory:  No respiratory distress. He has no wheezes.  GI: He exhibits no distension. There is no tenderness.  Musculoskeletal: He exhibits no edema.  Neurological: He is alert and oriented to person, place, and time.    Assessment/Plan: 1] difficulty breathing: Possibly secondary to bronchitis.  Patient also has history of COPD.  Presently he does not seem to have significant sign of fluid overload.  Chest x-ray no pulmonary edema but with bibasilar scarring.  He was dialyzed yesterday with 1-1/2 L fluid removal. 2] end-stage renal disease: Presently he does  not have any nausea or vomiting.  Potassium is good.  According to his wife recently he is not eating that much. 3] history of COPD 4] history of CVA 5] anemia: His hemoglobin is  below our target goal. 6] hypertension: His blood pressure seems to be reasonably controlled 7] bone and mineral disorder: Calcium is a range. Plan: 1] we will make arrangement for patient to get dialysis tomorrow which is his regular schedule 2] we will check his CBC and renal panel in the morning 3] we will try to remove about 4-1/6 L if systolic blood pressure remains above 90 4] we will use Epogen 2000 units after each dialysis.  Finnley Larusso S 11/23/2017, 8:30 AM

## 2017-11-23 NOTE — H&P (Signed)
TRH H&P    Patient Demographics:    Terry [name], is a 83 y.o. male  MRN: [id number]  DOB - July 14, 1934  Admit Date - 11/22/2017  Referring MD/NP/PA: Dr. Dayna [name]  Outpatient Primary MD for the patient is Terry Kern, DO  Patient coming from: Home  Chief complaint-shortness of breath   HPI:    Terry [name]  is a 83 y.o. male, with history of ESRD on hemodialysis Monday Wednesday Friday, hyperlipidemia, COPD, stroke, anxiety, CHF came to hospital with worsening shortness of breath.  Patient also developed nosebleed at home.  Patient was seen in the ED last night at [facility] and was discharged home after diagnosed with bronchitis.  As per wife patient breathing got worse today he did go for dialysis but the only removed 1.5 L of fluid, patient became more short of breath.  On EMS arrival patient's O2 sats were 81% on room air. In the ED chest x-ray showed no acute abnormality patient received Solu-Medrol and started on BiPAP. Denies chest pain. No nausea vomiting or diarrhea. No fever or chills .  History of stroke   Review of systems:     All other systems reviewed and are negative.   With Past History of the following :    Past Medical History:  Diagnosis Date  . Acute and chronic respiratory failure 10/22/2012  . Anxiety   . Arthritis    "hands, knees" (03/15/2017)  . Atrial fibrillation ([place])   . CAD (coronary artery disease) [place], [name]  . CHF (congestive heart failure) ([place]) 09/2012  . COPD (chronic obstructive pulmonary disease) ([place])   . ESRD (end stage renal disease) on dialysis [facility])    "MWF; [facility] [place]" (03/15/2017)  . Flu 09/24/2016   influenza b  . HCAP (healthcare-associated pneumonia) 03/15/2017  . High cholesterol   . History of blood transfusion 2012   "related to bowel resection"  . Hypertension   . On home oxygen therapy    "1L typically at night"  (03/15/2017)  . Osteoarthritis 10/22/2012  . Pneumonia April 2014; 09/2016  . Shortness of breath dyspnea   . Stroke [facility]) 2012   denies residual on 03/15/2017  . Stroke [facility]) ~ 2013   "small one in his right eye"      Past Surgical History:  Procedure Laterality Date  . APPENDECTOMY  1984  . AV FISTULA PLACEMENT Right 05/21/2015   Procedure: Right Arm Brachiocephalic ARTERIOVENOUS (AV) FISTULA CREATION;  Surgeon: Angelia Mould, MD;  Location: [place];  Service: Vascular;  Laterality: Right;  . CATARACT EXTRACTION W/ INTRAOCULAR LENS  IMPLANT, BILATERAL Bilateral   . COLECTOMY  2012   partial colon removed - twisted bowel  . CORONARY ANGIOPLASTY WITH STENT PLACEMENT  1999   2 stents  . CYSTOSCOPY/RETROGRADE/URETEROSCOPY Bilateral 04/23/2014   Procedure: CYSTOSCOPY BILATERAL RETROGRADE,  LEFT URETEROSCOPY, LEFT STENT PLACEMENT;  Surgeon: Raynelle Bring, MD;  [facility];  Service: Urology;  Laterality: Bilateral;  . EYE SURGERY Left    "surgery to clean  off lens after cataract OR"  . INGUINAL HERNIA REPAIR Right       Social History:      Social History   Tobacco Use  . Smoking status: Former Smoker    Packs/day: 1.00    Years: 49.00    Pack years: 49.00    Types: Cigarettes, Pipe, Cigars    Last attempt to quit: 02/21/2000    Years since quitting: 17.7  . Smokeless tobacco: Former Systems developer    Types: Chew  . Tobacco comment: "chewed when he was a kid"  Substance Use Topics  . Alcohol use: Yes    Comment: 03/15/2017 "nothing since the 1960's"       Family History :     Family History  Problem Relation Age of Onset  . Cancer Mother        Breast  . Heart disease Father   . Heart attack Father   . Parkinson's disease Brother       Home Medications:   Prior to Admission medications   Medication Sig Start Date End Date Taking? Authorizing Provider  acetaminophen (TYLENOL) 500 MG tablet Take 1,000 mg by mouth as needed for mild pain or headache.   Yes  [provider]  albuterol (PROVENTIL HFA;VENTOLIN HFA) 108 (90 Base) MCG/ACT inhaler Inhale 2 puffs into the lungs every 4 (four) hours as needed for wheezing or shortness of breath. 11/21/17  Yes Orlie Dakin, MD  albuterol (PROVENTIL) (2.5 MG/3ML) 0.083% nebulizer solution Take 3 mLs (2.5 mg total) by nebulization every 4 (four) hours as needed for wheezing or shortness of breath. 11/21/17  Yes Orlie Dakin, MD  aspirin EC 81 MG tablet Take 81 mg by mouth daily.   Yes [provider]  atorvastatin (LIPITOR) 80 MG tablet Take 1 tablet (80 mg total) by mouth daily. 11/10/17  Yes Burnell Blanks, MD  benzonatate (TESSALON) 100 MG capsule Take 1 capsule (100 mg total) by mouth every 8 (eight) hours. 11/21/17  Yes Orlie Dakin, MD  budesonide-formoterol [facility]) 160-4.5 MCG/ACT inhaler Inhale 2 puffs into the lungs 2 (two) times daily.   Yes [provider]  calcium acetate (PHOSLO) 667 MG capsule Take 667-1,334 mg by mouth 3 (three) times daily with meals.  01/09/16  Yes [provider]  cetirizine (ZYRTEC) 10 MG tablet Take 10 mg by mouth as needed for allergies.   Yes [provider]  clopidogrel (PLAVIX) 75 MG tablet Take 1 tablet (75 mg total) by mouth daily. 11/10/17  Yes Burnell Blanks, MD  dextromethorphan-guaiFENesin [facility]) 30-600 MG 12hr tablet Take 1 tablet by mouth 2 (two) times daily as needed for cough.   Yes [provider]  doxycycline (VIBRAMYCIN) 100 MG capsule Take 1 capsule (100 mg total) by mouth 2 (two) times daily. One po bid x 7 days 11/21/17  Yes Orlie Dakin, MD  ethyl chloride spray Apply 1 application topically See admin instructions. For dialysis 05/28/17  Yes [provider]  lisinopril (PRINIVIL,ZESTRIL) 20 MG tablet Take 20 mg by mouth daily.   Yes [provider]  methocarbamol (ROBAXIN) 500 MG tablet Take 500 mg by mouth 2 (two) times daily as needed for muscle spasms.   Yes  [provider]  metoprolol succinate (TOPROL-XL) 50 MG 24 hr tablet Take 50 mg by mouth 2 (two) times daily. Take with or immediately following a meal.    Yes [provider]  midodrine (PROAMATINE) 10 MG tablet Take 10 mg by mouth as  needed. At dialysis for low pressure   Yes [provider]  OXYGEN Inhale 1-3 L into the lungs daily.    Yes [provider]  oxymetazoline (AFRIN) 0.05 % nasal spray Place 1 spray into both nostrils once as needed for congestion.   Yes [provider]  predniSONE (DELTASONE) 5 MG tablet Take 2 tablets (10 mg total) by mouth daily with breakfast. 11/21/17  Yes Orlie Dakin, MD  Glycopyrrolate-Formoterol (BEVESPI AEROSPHERE) 9-4.8 MCG/ACT AERO Inhale 2 puffs into the lungs 2 (two) times daily. Patient not taking: Reported on 11/22/2017 11/18/17   Tanda Rockers, MD     Allergies:     Allergies  Allergen Reactions  . Yellow Dyes (Non-Tartrazine) Other (See Comments)    Burns arms Cough medications (tussionex and benzonatate ok)  . Levaquin [Levofloxacin In D5w] Itching  . Levofloxacin Nausea And Vomiting     Physical Exam:   Vitals  Blood pressure (!) 114/50, pulse 93, temperature 97.7 F (36.5 C), temperature source Oral, resp. rate 17, height 5\' 8"  (1.727 m), weight 70.3 kg (155 lb), SpO2 94 %.  1.  General: Patient is currently on BiPAP  2. Psychiatric:  Intact judgement and  insight, awake alert, oriented x 3.  3. Neurologic: No focal neurological deficits, all cranial nerves intact.Strength 5/5 all 4 extremities, sensation intact all 4 extremities, plantars down going.  4. Eyes :  anicteric sclerae, moist conjunctivae with no lid lag. PERRLA.  5. ENMT:  On BiPAP  6. Neck:  supple, no cervical lymphadenopathy appriciated, No thyromegaly  7. Respiratory : Normal respiratory effort, decreased breath sounds bilaterally  8. Cardiovascular : RRR, no gallops, rubs or murmurs, no leg edema  9.  Gastrointestinal:  Positive bowel sounds, abdomen soft, non-tender to palpation,no hepatosplenomegaly, no rigidity or guarding       10. Skin:  No cyanosis, normal texture and turgor, no rash, lesions or ulcers  11.Musculoskeletal:  Good muscle tone,  joints appear normal , no effusions,  normal range of motion    Data Review:    CBC Recent Labs  Lab 11/21/17 1313 11/22/17 2145  WBC 9.2 9.0  HGB 10.2* 10.6*  HCT 33.2* 34.9*  PLT 247 264  MCV 103.1* 103.9*  MCH 31.7 31.5  MCHC 30.7 30.4  RDW 17.9* 18.4*  LYMPHSABS  --  1.0  MONOABS  --  1.3  EOSABS  --  0.0  BASOSABS  --  0.0   ------------------------------------------------------------------------------------------------------------------  Chemistries  Recent Labs  Lab 11/21/17 1313 11/22/17 2145  NA 131* 135  K 4.4 3.6  CL 88* 88*  CO2 28 35*  GLUCOSE 106* 133*  BUN 40* 29*  CREATININE 5.84* 3.46*  CALCIUM 9.0 8.5*  AST  --  26  ALT  --  16*  ALKPHOS  --  60  BILITOT  --  1.0   ------------------------------------------------------------------------------------------------------------------  ------------------------------------------------------------------------------------------------------------------ GFR: Estimated Creatinine Clearance: 16.5 mL/min (A) (by C-G formula based on SCr of 3.46 mg/dL (H)). Liver Function Tests: Recent Labs  Lab 11/22/17 2145  AST 26  ALT 16*  ALKPHOS 60  BILITOT 1.0  PROT 7.5  ALBUMIN 3.7   No results for input(s): LIPASE, AMYLASE in the last 168 hours. No results for input(s): AMMONIA in the last 168 hours. Coagulation Profile: No results for input(s): INR, PROTIME in the last 168 hours. Cardiac Enzymes: Recent Labs  Lab 11/22/17 2145  TROPONINI 0.04*      Imaging Results:    Dg Chest 2 View  Result Date: 11/21/2017 CLINICAL DATA:  Shortness of breath and coughing. EXAM: CHEST - 2 VIEW COMPARISON:  July 16, 2017 FINDINGS: Atelectasis in the bases.  No pneumothorax. Cardiomegaly. No other abnormalities. IMPRESSION: No active cardiopulmonary disease. Electronically Signed   By: Dorise Bullion III M.D   On: 11/21/2017 13:46   Dg Chest Port 1 View  Result Date: 11/22/2017 CLINICAL DATA:  Shortness of breath. EXAM: PORTABLE CHEST 1 VIEW COMPARISON:  Frontal and lateral views yesterday. FINDINGS: Stable hyperinflation. Bibasilar atelectasis or scarring. Unchanged cardiomegaly and mediastinal contours. No pulmonary edema, confluent airspace disease or large pleural effusion. No pneumothorax. IMPRESSION: Similar bibasilar atelectasis or scarring.  No new abnormality. Electronically Signed   By: Jeb Levering M.D.   On: 11/22/2017 22:34    My personal review of EKG: Rhythm-atrial fibrillation   Assessment & Plan:    Active Problems:   Acute respiratory failure ([place])   1. Acute on chronic hypoxic respiratory failure-likely multifactorial from underlying COPD and pulmonary edema.  Patient is currently on BiPAP.  Started on Solu-Medrol 60 mg IV every 6 hours, also consulted nephrology for hemodialysis in a.m. 2. COPD exacerbation-started Solu-Medrol 60 mg IV every 6 hours, Xopenex nebulizer every 6 hours, ipratropium nebulizer every 6 hours, continue with p.o. Doxycycline. 3. ESRD on hemodialysis-patient got his dialysis today but as per wife not much fluid was removed he might be in fluid overload and will require dialysis.  Will consult nephrology in a.m. 4. Elevated troponin-likely from underlying ESRD and demand ischemia.  Cycle troponin every 6 hours x3.  Patient denies chest pain at this time. 5. Hypertensive urgency-1 dose of metoprolol  5 mg IV given in the ED, will restart home medication Toprol-XL.  Also start hydralazine PRN 6. Atrial fibrillation with RVR -restart Toprol-XL, patient is not on anticoagulation with warfarin.  Followed by cardiology as outpatient   DVT Prophylaxis-  SCD  AM Labs Ordered, also please review Full  Orders  Family Communication: Admission, patients condition and plan of care including tests being ordered have been discussed with the patient and his wife at bedside who indicate understanding and agree with the plan and Code Status.  Code Status: Full code  Admission status: Inpatient  Time spent in minutes : 60 minutes   Oswald Hillock M.D on 11/23/2017 at 2:47 AM  Between 7am to 7pm - Pager - [phone]. After 7pm go to [email or web address] - password [facility]  [facility] - Office  [phone]

## 2017-11-23 NOTE — Progress Notes (Signed)
Pt placed on 2L  with humidity

## 2017-11-23 NOTE — Progress Notes (Signed)
Patient adamant about not wearing BIPAP machine tonight. RN notified. Will monitor through the night. Patient agrees to put machine back on if he gets in distress and needs it.

## 2017-11-24 DIAGNOSIS — N186 End stage renal disease: Secondary | ICD-10-CM

## 2017-11-24 DIAGNOSIS — J441 Chronic obstructive pulmonary disease with (acute) exacerbation: Secondary | ICD-10-CM

## 2017-11-24 DIAGNOSIS — Z992 Dependence on renal dialysis: Secondary | ICD-10-CM

## 2017-11-24 DIAGNOSIS — I480 Paroxysmal atrial fibrillation: Secondary | ICD-10-CM

## 2017-11-24 LAB — CBC
HEMATOCRIT: 31.4 % — AB (ref 39.0–52.0)
HEMOGLOBIN: 9.5 g/dL — AB (ref 13.0–17.0)
MCH: 31.6 pg (ref 26.0–34.0)
MCHC: 30.3 g/dL (ref 30.0–36.0)
MCV: 104.3 fL — AB (ref 78.0–100.0)
Platelets: 274 10*3/uL (ref 150–400)
RBC: 3.01 MIL/uL — ABNORMAL LOW (ref 4.22–5.81)
RDW: 18.4 % — ABNORMAL HIGH (ref 11.5–15.5)
WBC: 14.3 10*3/uL — AB (ref 4.0–10.5)

## 2017-11-24 LAB — RENAL FUNCTION PANEL
ANION GAP: 14 (ref 5–15)
Albumin: 3.5 g/dL (ref 3.5–5.0)
BUN: 65 mg/dL — ABNORMAL HIGH (ref 6–20)
CO2: 33 mmol/L — ABNORMAL HIGH (ref 22–32)
Calcium: 8.8 mg/dL — ABNORMAL LOW (ref 8.9–10.3)
Chloride: 92 mmol/L — ABNORMAL LOW (ref 101–111)
Creatinine, Ser: 6.19 mg/dL — ABNORMAL HIGH (ref 0.61–1.24)
GFR calc Af Amer: 9 mL/min — ABNORMAL LOW (ref >60)
GFR, EST NON AFRICAN AMERICAN: 8 mL/min — AB (ref >60)
Glucose, Bld: 139 mg/dL — ABNORMAL HIGH (ref 65–99)
PHOSPHORUS: 4.3 mg/dL (ref 2.5–4.6)
POTASSIUM: 4.5 mmol/L (ref 3.5–5.1)
Sodium: 139 mmol/L (ref 135–145)

## 2017-11-24 MED ORDER — LEVALBUTEROL HCL 0.63 MG/3ML IN NEBU
1.2500 mg | INHALATION_SOLUTION | Freq: Four times a day (QID) | RESPIRATORY_TRACT | Status: DC
  Administered 2017-11-24 – 2017-11-25 (×5): 1.25 mg via RESPIRATORY_TRACT
  Filled 2017-11-24 (×5): qty 6

## 2017-11-24 MED ORDER — ZOLPIDEM TARTRATE 5 MG PO TABS
5.0000 mg | ORAL_TABLET | Freq: Once | ORAL | Status: AC
  Administered 2017-11-24: 5 mg via ORAL
  Filled 2017-11-24: qty 1

## 2017-11-24 MED ORDER — EPOETIN ALFA 2000 UNIT/ML IJ SOLN
INTRAMUSCULAR | Status: AC
  Administered 2017-11-24: 2000 [IU] via INTRAVENOUS
  Filled 2017-11-24: qty 1

## 2017-11-24 MED ORDER — IPRATROPIUM BROMIDE 0.02 % IN SOLN
0.5000 mg | Freq: Four times a day (QID) | RESPIRATORY_TRACT | Status: DC
  Administered 2017-11-24 – 2017-11-27 (×11): 0.5 mg via RESPIRATORY_TRACT
  Filled 2017-11-24 (×11): qty 2.5

## 2017-11-24 MED ORDER — BENZONATATE 100 MG PO CAPS
200.0000 mg | ORAL_CAPSULE | Freq: Once | ORAL | Status: AC
  Administered 2017-11-24: 200 mg via ORAL
  Filled 2017-11-24: qty 2

## 2017-11-24 NOTE — Progress Notes (Addendum)
Terry [name].  MRN: [id number]  DOB/AGE: [birth year] 81 y.o.  Primary Care Physician:Kim, Nickola Major, DO  Admit date: 11/22/2017  Chief Complaint:  Chief Complaint  Patient presents with  . Shortness of Breath  . Epistaxis    S-Pt presented on  11/22/2017 with  Chief Complaint  Patient presents with  . Shortness of Breath  . Epistaxis  .    Pt says " I am feeling better, I want to go home"    Pt tolerating tx well.   Meds . aspirin EC  81 mg Oral Daily  . atorvastatin  80 mg Oral Daily  . budesonide (PULMICORT) nebulizer solution  0.5 mg Nebulization BID  . calcium acetate  667 mg Oral TID WC  . Chlorhexidine Gluconate Cloth  6 each Topical Q0600  . Chlorhexidine Gluconate Cloth  6 each Topical Q0600  . clopidogrel  75 mg Oral Daily  . doxycycline  100 mg Oral BID  . epoetin (EPOGEN/PROCRIT) injection  2,000 Units Intravenous Q M,W,F-HD  . ipratropium  0.5 mg Nebulization TID  . levalbuterol  0.63 mg Nebulization TID  . loratadine  10 mg Oral Daily  . methylPREDNISolone (SOLU-MEDROL) injection  60 mg Intravenous Q6H  . metoprolol succinate  50 mg Oral BID  . mupirocin ointment  1 application Nasal BID  . sodium chloride flush  3 mL Intravenous Q12H     Physical Exam: Vital signs in last 24 hours: Temp:  [97.6 F (36.4 C)-98.2 F (36.8 C)] 97.7 F (36.5 C) (06/05 0645) Pulse Rate:  [94-120] 111 (06/05 0900) Resp:  [15-24] 18 (06/05 0645) BP: (102-152)/(41-78) 124/58 (06/05 0900) SpO2:  [91 %-97 %] 96 % (06/05 0645) Weight:  [153 lb 3.5 oz (69.5 kg)] 153 lb 3.5 oz (69.5 kg) (06/05 0645) Weight change: -1 lb 12.5 oz (-0.807 kg) Last BM Date: 11/22/17  Intake/Output from previous day: 06/04 0701 - 06/05 0700 In: 240 [P.O.:240] Out: -  Total I/O In: 220 [P.O.:220] Out: -    Physical Exam: General- pt is awake,alert, oriented to time place and person Resp- No acute REsp distress,Rhonchi+ CVS- S1S2 regular ij rate and rhythm GIT- BS+, soft, NT, ND EXT- NO  LE Edema, Cyanosis Access- AVF present two needles in situ  Lab Results: CBC Recent Labs    11/23/17 0434 11/24/17 0336  WBC 9.9 14.3*  HGB 9.8* 9.5*  HCT 32.2* 31.4*  PLT 252 274    BMET Recent Labs    11/23/17 0415 11/24/17 0336  NA 139 139  K 3.7 4.5  CL 95* 92*  CO2 31 33*  GLUCOSE 217* 139*  BUN 37* 65*  CREATININE 4.02* 6.19*  CALCIUM 8.5* 8.8*    MICRO Recent Results (from the past 240 hour(s))  MRSA PCR Screening     Status: Abnormal   Collection Time: 11/23/17  3:40 AM  Result Value Ref Range Status   MRSA by PCR POSITIVE (A) NEGATIVE Corrected    Comment: MISTY SMART 11/23/17 @ 0902 LSCHMIDT        The GeneXpert MRSA Assay (FDA approved for NASAL specimens only), is one component of a comprehensive MRSA colonization surveillance program. It is not intended to diagnose MRSA infection nor to guide or monitor treatment for MRSA infections. Performed at [facility], [address] [place], [place] [address] CORRECTED ON 06/04 AT 9563: PREVIOUSLY REPORTED AS POSITIVE        The GeneXpert MRSA Assay (FDA approved for NASAL specimens only), is one component  of a comprehensive MRSA colonization surveillance program. It is not intended to diagnose MRSA infection  nor to guide or monitor treatment for MRSA infections. RESULT CALLED TO, READ BACK BY AND VERIFIED WITH: MISTY SMART 11/23/17 @ 0902N LSCHMIDT       Lab Results  Component Value Date   PTH 159 (H) 05/15/2015   CALCIUM 8.8 (L) 11/24/2017   CAION 1.14 06/24/2015   PHOS 4.3 11/24/2017               Impression: 1)Renal  ESRD on HD                Pt is on Monday/Wednesday/Friday schedule                Pt being dialyzed today  2)HTN  Medication- On Beta blockers    3)Anemia HGb at goal (9--11) IN ESRD the goal for HGb is 9-11 Pt is on ESA  4)CKD Mineral-Bone Disorder PTH acceptable. Secondary Hyperparathyroidism present. Phosphorus at goal.   5)REsp-admitted with COPD  exacerbation Primary MD following  6)Electrolytes Normokalemic NOrmonatremic    7)Acid base Co2 at goal     Plan:  Will continue current care     [place] S 11/24/2017, 9:28 AM

## 2017-11-24 NOTE — Progress Notes (Signed)
[facility] medical coordinator called about an appt that pt had scheduled for yesterday at [facility] to have a Lf. Upper extremity artery duplex US completed. He was hospitalized and could not have this done. She called to see if Pt could possibly have this test completed here during this hospitalization. Pt is being evaluated  for trans catheter aortic valve replacement (TAVR). Her call back number is [phone].  Ericka Pontiff, RN 9:23 AM 11/24/17

## 2017-11-24 NOTE — Procedures (Signed)
      HEMODIALYSIS TREATMENT NOTE:    4 hour heparin-free dialysis completed via right upper arm AVF (16g/antegrade). Goal met: 2.5L removed without interruption in ultrafiltration.  All blood was returned and hemostasis was achieved in 15 minutes. Report given to Gerome Sam, RN.   Rockwell Alexandria, RN, CDN

## 2017-11-24 NOTE — Progress Notes (Signed)
PROGRESS NOTE  Terry [name]. BTD:[id number] DOB: 02-Mar-1935 DOA: 11/22/2017 PCP: [name], DO  Brief History:  83 yo male with history of PAF, HTN, HLD, CVA, COPD, CAD, ESRD on HD presented with 1 to 2-day history of worsening shortness of breath.  The patient was actually seen in the emergency department at [facility] on 11/21/2017 and discharged home in stable condition with bronchitis.  The patient continued to have worsening shortness of breath while he was on dialysis on 11/22/2017.  As result, EMS was activated.  Upon EMS arrival, O2 oxygen saturation was 81% on room air.  The patient was started on BiPAP.  He has been since weaned off.  The patient was started on bronchodilators and nephrology was consulted for maintenance dialysis.  He was admitted to [facility] in March 2019 with SOB. He was in atrial fib and treated for pneumonia and COPD exacerbation. Cardiac cath with severe RCA disease (70% ostial stenosis, 95% mid stenosis, 50% LAD proximal stenosis, Circumflex 40% mid stenosis.    Assessment/Plan: Acute on chronic respiratory failure with hypoxia -Patient normally on 1 L oxygen at night -Presently stable on 3 L -Secondary COPD exacerbation -Wean oxygen as tolerated  COPD exacerbation -Continue Pulmicort -Continue Xopenex and Atrovent -Continue IV Solu-Medrol  Atrial fibrillation with RVR -secondary to COPD exacerbation -CHADSVASc = 4 -not on AC due to hx of GI Bleeding -continue ASA and Plavix -continue metoprolol succinate -HR still 100-110  CAD -08/2017--s/p PCI to RCA at [facility]--DES x 2 -no chest pain presently -continue metoprolol succinate -continue statin -continue ASA and plavix  ESRD -per nephrology -HD on MWF -metabolic bone disease per renal  Aortic Stenosis -currently being worked up at [facility] for TAVR -Echo March 2019 with LVEF=55%, severe AS with AVA 0.7 cm2, mean gradient 21 mmHg, mild LVH     Disposition Plan:   Home in  2-3 days  Family Communication:   Spouse updated at bedside 6/5--Total time spent 35 minutes.  Greater than 50% spent face to face counseling and coordinating care.   Consultants:  none  Code Status:  FULL  DVT Prophylaxis:  SCDs   Procedures: As Listed in Progress Note Above  Antibiotics: Doxy 6/3>>>    Subjective: Patient states that he is breathing about 50% better.  Still continues to have a nonproductive cough.  Denies any fevers, chills, chest pain, nausea, vomiting, diarrhea, abdominal pain.  There is no dysuria hematuria.  Denies any headache or neck pain.  Objective: Vitals:   11/24/17 1015 11/24/17 1030 11/24/17 1045 11/24/17 1100  BP: 130/62 128/65 121/67 132/66  Pulse: (!) 107 (!) 102 (!) 106 (!) 105  Resp:      Temp:      TempSrc:      SpO2:      Weight:      Height:        Intake/Output Summary (Last 24 hours) at 11/24/2017 1112 Last data filed at 11/24/2017 1100 Gross per 24 hour  Intake 460 ml  Output 2500 ml  Net -2040 ml   Weight change: -0.807 kg (-1 lb 12.5 oz) Exam:   General:  Pt is alert, follows commands appropriately, not in acute distress  HEENT: No icterus, No thrush, No neck mass, [place]/AT  Cardiovascular: RRR, S1/S2, no rubs, no gallops  Respiratory: Bilateral scattered rales.  Bibasilar expiratory wheeze.  Good air movement.  Abdomen: Soft/+BS, non tender, non distended, no guarding  Extremities: No edema, No lymphangitis, No petechiae, No rashes, no synovitis   Data Reviewed: I have personally reviewed following labs and imaging studies Basic Metabolic Panel: Recent Labs  Lab 11/21/17 1313 11/22/17 2145 11/23/17 0415 11/24/17 0336  NA 131* 135 139 139  K 4.4 3.6 3.7 4.5  CL 88* 88* 95* 92*  CO2 28 35* 31 33*  GLUCOSE 106* 133* 217* 139*  BUN 40* 29* 37* 65*  CREATININE 5.84* 3.46* 4.02* 6.19*  CALCIUM 9.0 8.5* 8.5* 8.8*  PHOS  --   --   --  4.3   Liver Function Tests: Recent Labs  Lab 11/22/17 2145 11/23/17 0415  11/24/17 0336  AST 26 25  --   ALT 16* 17  --   ALKPHOS 60 54  --   BILITOT 1.0 0.8  --   PROT 7.5 7.0  --   ALBUMIN 3.7 3.6 3.5   No results for input(s): LIPASE, AMYLASE in the last 168 hours. No results for input(s): AMMONIA in the last 168 hours. Coagulation Profile: No results for input(s): INR, PROTIME in the last 168 hours. CBC: Recent Labs  Lab 11/21/17 1313 11/22/17 2145 11/23/17 0434 11/24/17 0336  WBC 9.2 9.0 9.9 14.3*  NEUTROABS  --  6.6  --   --   HGB 10.2* 10.6* 9.8* 9.5*  HCT 33.2* 34.9* 32.2* 31.4*  MCV 103.1* 103.9* 104.9* 104.3*  PLT 247 264 252 274   Cardiac Enzymes: Recent Labs  Lab 11/22/17 2145 11/23/17 0415 11/23/17 1006 11/23/17 1536  TROPONINI 0.04* 0.04* 0.03* 0.03*   BNP: Invalid input(s): POCBNP CBG: No results for input(s): GLUCAP in the last 168 hours. HbA1C: No results for input(s): HGBA1C in the last 72 hours. Urine analysis:    Component Value Date/Time   COLORURINE AMBER (A) 08/16/2015 0655   APPEARANCEUR HAZY (A) 08/16/2015 0655   LABSPEC 1.023 08/16/2015 0655   PHURINE 5.0 08/16/2015 0655   GLUCOSEU 100 (A) 08/16/2015 0655   HGBUR NEGATIVE 08/16/2015 0655   BILIRUBINUR SMALL (A) 08/16/2015 0655   KETONESUR 15 (A) 08/16/2015 0655   PROTEINUR >300 (A) 08/16/2015 0655   UROBILINOGEN 0.2 04/16/2014 1135   NITRITE NEGATIVE 08/16/2015 0655   LEUKOCYTESUR SMALL (A) 08/16/2015 0655   Sepsis Labs: @LABRCNTIP (procalcitonin:4,lacticidven:4) ) Recent Results (from the past 240 hour(s))  MRSA PCR Screening     Status: Abnormal   Collection Time: 11/23/17  3:40 AM  Result Value Ref Range Status   MRSA by PCR POSITIVE (A) NEGATIVE Corrected    Comment: MISTY SMART 11/23/17 @ 0902 LSCHMIDT        The GeneXpert MRSA Assay (FDA approved for NASAL specimens only), is one component of a comprehensive MRSA colonization surveillance program. It is not intended to diagnose MRSA infection nor to guide or monitor treatment for MRSA  infections. Performed at [facility], [address], [place], [place] [address] CORRECTED ON 06/04 AT 3762: PREVIOUSLY REPORTED AS POSITIVE        The GeneXpert MRSA Assay (FDA approved for NASAL specimens only), is one component of a comprehensive MRSA colonization surveillance program. It is not intended to diagnose MRSA infection  nor to guide or monitor treatment for MRSA infections. RESULT CALLED TO, READ BACK BY AND VERIFIED WITH: MISTY SMART 11/23/17 @ 0902N LSCHMIDT      Scheduled Meds: . aspirin EC  81 mg Oral Daily  . atorvastatin  80 mg Oral Daily  . budesonide (PULMICORT) nebulizer solution  0.5 mg Nebulization BID  .  calcium acetate  667 mg Oral TID WC  . Chlorhexidine Gluconate Cloth  6 each Topical Q0600  . Chlorhexidine Gluconate Cloth  6 each Topical Q0600  . clopidogrel  75 mg Oral Daily  . doxycycline  100 mg Oral BID  . epoetin (EPOGEN/PROCRIT) injection  2,000 Units Intravenous Q M,W,F-HD  . ipratropium  0.5 mg Nebulization TID  . levalbuterol  0.63 mg Nebulization TID  . loratadine  10 mg Oral Daily  . methylPREDNISolone (SOLU-MEDROL) injection  60 mg Intravenous Q6H  . metoprolol succinate  50 mg Oral BID  . mupirocin ointment  1 application Nasal BID  . sodium chloride flush  3 mL Intravenous Q12H   Continuous Infusions: . sodium chloride    . sodium chloride    . sodium chloride      Procedures/Studies: Dg Chest 2 View  Result Date: 11/21/2017 CLINICAL DATA:  Shortness of breath and coughing. EXAM: CHEST - 2 VIEW COMPARISON:  July 16, 2017 FINDINGS: Atelectasis in the bases. No pneumothorax. Cardiomegaly. No other abnormalities. IMPRESSION: No active cardiopulmonary disease. Electronically Signed   By: Dorise Bullion III M.D   On: 11/21/2017 13:46   Dg Chest Port 1 View  Result Date: 11/22/2017 CLINICAL DATA:  Shortness of breath. EXAM: PORTABLE CHEST 1 VIEW COMPARISON:  Frontal and lateral views yesterday. FINDINGS: Stable hyperinflation. Bibasilar  atelectasis or scarring. Unchanged cardiomegaly and mediastinal contours. No pulmonary edema, confluent airspace disease or large pleural effusion. No pneumothorax. IMPRESSION: Similar bibasilar atelectasis or scarring.  No new abnormality. Electronically Signed   By: Jeb Levering M.D.   On: 11/22/2017 22:34   Dg Knee Complete 4 Views Left  Result Date: 11/10/2017 CLINICAL DATA:  Left knee pain after fall 2 days ago. EXAM: LEFT KNEE - COMPLETE 4+ VIEW COMPARISON:  None. FINDINGS: No evidence of fracture, dislocation, or joint effusion. No evidence of arthropathy or other focal bone abnormality. Vascular calcifications are noted. IMPRESSION: No acute abnormality seen in the left knee. Electronically Signed   By: Marijo Conception, M.D.   On: 11/10/2017 16:21    Orson Eva, DO  [facility] Pager [phone]  If 7PM-7AM, please contact night-coverage [email or web address] Password [email or web address] 11/24/2017, 11:12 AM   LOS: 1 day

## 2017-11-25 ENCOUNTER — Inpatient Hospital Stay (HOSPITAL_COMMUNITY): Admission: RE | Admit: 2017-11-25 | Payer: Medicare PPO | Source: Ambulatory Visit

## 2017-11-25 ENCOUNTER — Encounter: Payer: Medicare PPO | Admitting: Vascular Surgery

## 2017-11-25 LAB — RESPIRATORY PANEL BY PCR
Adenovirus: NOT DETECTED
Bordetella pertussis: NOT DETECTED
CORONAVIRUS 229E-RVPPCR: NOT DETECTED
CORONAVIRUS NL63-RVPPCR: NOT DETECTED
CORONAVIRUS OC43-RVPPCR: NOT DETECTED
Chlamydophila pneumoniae: NOT DETECTED
Coronavirus HKU1: NOT DETECTED
Influenza A: NOT DETECTED
Influenza B: NOT DETECTED
METAPNEUMOVIRUS-RVPPCR: NOT DETECTED
Mycoplasma pneumoniae: NOT DETECTED
PARAINFLUENZA VIRUS 1-RVPPCR: NOT DETECTED
PARAINFLUENZA VIRUS 2-RVPPCR: NOT DETECTED
Parainfluenza Virus 3: DETECTED — AB
Parainfluenza Virus 4: NOT DETECTED
RESPIRATORY SYNCYTIAL VIRUS-RVPPCR: NOT DETECTED
Rhinovirus / Enterovirus: NOT DETECTED

## 2017-11-25 MED ORDER — LEVALBUTEROL HCL 1.25 MG/0.5ML IN NEBU
1.2500 mg | INHALATION_SOLUTION | Freq: Four times a day (QID) | RESPIRATORY_TRACT | Status: DC
  Administered 2017-11-26 – 2017-11-27 (×5): 1.25 mg via RESPIRATORY_TRACT
  Filled 2017-11-25 (×5): qty 0.5

## 2017-11-25 MED ORDER — LEVALBUTEROL HCL 1.25 MG/0.5ML IN NEBU
INHALATION_SOLUTION | RESPIRATORY_TRACT | Status: AC
  Administered 2017-11-25: 1.25 mg
  Filled 2017-11-25: qty 0.5

## 2017-11-25 NOTE — Progress Notes (Signed)
Subjective: Interval History: has no complaint of difficulty breathing.  Patient is still has some cough and sputum production.  He states that he is feeling much better..  Objective: Vital signs in last 24 hours: Temp:  [97.2 F (36.2 C)-98 F (36.7 C)] 97.8 F (36.6 C) (06/06 0400) Pulse Rate:  [93-117] 94 (06/06 0600) Resp:  [18-31] 24 (06/06 0600) BP: (103-162)/(50-107) 122/57 (06/06 0400) SpO2:  [90 %-100 %] 90 % (06/06 0600) Weight:  [67 kg (147 lb 11.3 oz)-67.5 kg (148 lb 13 oz)] 67.5 kg (148 lb 13 oz) (06/06 0500) Weight change: -2.5 kg (-5 lb 8.2 oz)  Intake/Output from previous day: 06/05 0701 - 06/06 0700 In: 470 [P.O.:470] Out: 2500  Intake/Output this shift: No intake/output data recorded.  General appearance: alert, cooperative and no distress Resp: wheezes bilaterally Cardio: regular rate and rhythm Extremities: No edema  Lab Results: Recent Labs    11/23/17 0434 11/24/17 0336  WBC 9.9 14.3*  HGB 9.8* 9.5*  HCT 32.2* 31.4*  PLT 252 274   BMET:  Recent Labs    11/23/17 0415 11/24/17 0336  NA 139 139  K 3.7 4.5  CL 95* 92*  CO2 31 33*  GLUCOSE 217* 139*  BUN 37* 65*  CREATININE 4.02* 6.19*  CALCIUM 8.5* 8.8*   No results for input(s): PTH in the last 72 hours. Iron Studies: No results for input(s): IRON, TIBC, TRANSFERRIN, FERRITIN in the last 72 hours.  Studies/Results: No results found.  I have reviewed the patient's current medications.  Assessment/Plan: 1] difficulty breathing: Patient seems to be feeling better.  Patient was dialyzed yesterday with 2500 cc fluid removal.  Patient also with history of COPD. 2] anemia: His hemoglobin is below our target goal but stable.  He is on Epogen 3] bone and mineral disorder: Calcium and phosphorus is range 4] hypertension: His blood pressure is reasonably controlled 5] COPD: Patient is on inhaler 6] atrial fibrillation: His heart rate is controlled Plan: 1] patient does require dialysis  today 2] we will make arrangement for patient to get dialysis tomorrow and will try to get another 4-9/1 L if systolic blood pressure remains above 90 3] we will check a CBC and renal panel in the morning.    LOS: 2 days   Miche Loughridge S 11/25/2017,8:18 AM

## 2017-11-25 NOTE — Progress Notes (Signed)
PROGRESS NOTE  Terry [name]. HQP:[id number] DOB: [birth year] DOA: 11/22/2017 PCP: [name], DO  Brief History:  83yo male with history of PAF, HTN, HLD, CVA, COPD, CAD, ESRD on HD presented with 1 to 2-day history of worsening shortness of breath.  The patient was actually seen in the emergency department at [facility] on 11/21/2017 and discharged home in stable condition with bronchitis.  The patient continued to have worsening shortness of breath while he was on dialysis on 11/22/2017.  As result, EMS was activated.  Upon EMS arrival, O2 oxygen saturation was 81% on room air.  The patient was started on BiPAP.  He has been since weaned off.  The patient was started on bronchodilators and nephrology was consulted for maintenance dialysis. He was admitted to [facility] in March 2019 with SOB. He was in atrial fib and treated for pneumonia and COPD exacerbation. Cardiac cath with severe RCA disease (70% ostial stenosis, 95% mid stenosis, 50% LAD proximal stenosis, Circumflex 40% mid stenosis.    Assessment/Plan: Acute on chronic respiratory failure with hypoxia -Patient normally on 1 L oxygen at night -Presently stable on 3 L -Secondary COPD exacerbation -Wean oxygen as tolerated  COPD exacerbation -Continue Pulmicort -Continue Xopenex and Atrovent -Continue IV Solu-Medrol -add Brovana -viral respiratory panel-->Positive parainfluenza  Atrial fibrillation with RVR -secondary to COPD exacerbation -CHADSVASc = 4 -not on AC due to hx of GI Bleeding -continue ASA and Plavix -continue metoprolol succinate -HR improving with tx of COPD  CAD -08/2017--s/p PCI to RCA at [facility]--DES x 2 -no chest pain presently -continue metoprolol succinate -continue statin -continue ASA and plavix  ESRD -per nephrology -HD on MWF -metabolic bone disease per renal  Aortic Stenosis -currently being worked up at [facility] for TAVR -Echo March 2019 with LVEF=55%, severe AS  with AVA 0.7 cm2, mean gradient 21 mmHg, mild LVH     Disposition Plan:   Home in 1-2  days  Family Communication:   Spouse updated at bedside 6/6   Consultants:  none  Code Status:  FULL  DVT Prophylaxis:  SCDs   Procedures: As Listed in Progress Note Above  Antibiotics: Doxy 6/3>>>6/6     Subjective: Overall, the patient is breathing better.  He still has a nonproductive cough.  He denies any chest pain, nausea, vomiting, diarrhea, abdominal pain, dysuria, hematuria.  Objective: Vitals:   11/25/17 0600 11/25/17 0800 11/25/17 1100 11/25/17 1300  BP:      Pulse: 94  88 97  Resp: (!) 24  18 16   Temp:  97.9 F (36.6 C)    TempSrc:  Axillary    SpO2: 90%  97% 95%  Weight:      Height:        Intake/Output Summary (Last 24 hours) at 11/25/2017 1709 Last data filed at 11/25/2017 0200 Gross per 24 hour  Intake 250 ml  Output -  Net 250 ml   Weight change: -2.5 kg (-5 lb 8.2 oz) Exam:   General:  Pt is alert, follows commands appropriately, not in acute distress  HEENT: No icterus, No thrush, No neck mass, [place]/AT  Cardiovascular: IRRR, S1/S2, no rubs, no gallops  Respiratory: Bibasilar rales.  Bibasilar expiratory wheeze.  Good air movement.  Abdomen: Soft/+BS, non tender, non distended, no guarding  Extremities: No edema, No lymphangitis, No petechiae, No rashes, no synovitis   Data Reviewed: I have personally reviewed following labs and imaging studies  Basic Metabolic Panel: Recent Labs  Lab 11/21/17 1313 11/22/17 2145 11/23/17 0415 11/24/17 0336  NA 131* 135 139 139  K 4.4 3.6 3.7 4.5  CL 88* 88* 95* 92*  CO2 28 35* 31 33*  GLUCOSE 106* 133* 217* 139*  BUN 40* 29* 37* 65*  CREATININE 5.84* 3.46* 4.02* 6.19*  CALCIUM 9.0 8.5* 8.5* 8.8*  PHOS  --   --   --  4.3   Liver Function Tests: Recent Labs  Lab 11/22/17 2145 11/23/17 0415 11/24/17 0336  AST 26 25  --   ALT 16* 17  --   ALKPHOS 60 54  --   BILITOT 1.0 0.8  --   PROT  7.5 7.0  --   ALBUMIN 3.7 3.6 3.5   No results for input(s): LIPASE, AMYLASE in the last 168 hours. No results for input(s): AMMONIA in the last 168 hours. Coagulation Profile: No results for input(s): INR, PROTIME in the last 168 hours. CBC: Recent Labs  Lab 11/21/17 1313 11/22/17 2145 11/23/17 0434 11/24/17 0336  WBC 9.2 9.0 9.9 14.3*  NEUTROABS  --  6.6  --   --   HGB 10.2* 10.6* 9.8* 9.5*  HCT 33.2* 34.9* 32.2* 31.4*  MCV 103.1* 103.9* 104.9* 104.3*  PLT 247 264 252 274   Cardiac Enzymes: Recent Labs  Lab 11/22/17 2145 11/23/17 0415 11/23/17 1006 11/23/17 1536  TROPONINI 0.04* 0.04* 0.03* 0.03*   BNP: Invalid input(s): POCBNP CBG: No results for input(s): GLUCAP in the last 168 hours. HbA1C: No results for input(s): HGBA1C in the last 72 hours. Urine analysis:    Component Value Date/Time   COLORURINE AMBER (A) 08/16/2015 0655   APPEARANCEUR HAZY (A) 08/16/2015 0655   LABSPEC 1.023 08/16/2015 0655   PHURINE 5.0 08/16/2015 0655   GLUCOSEU 100 (A) 08/16/2015 0655   HGBUR NEGATIVE 08/16/2015 0655   BILIRUBINUR SMALL (A) 08/16/2015 0655   KETONESUR 15 (A) 08/16/2015 0655   PROTEINUR >300 (A) 08/16/2015 0655   UROBILINOGEN 0.2 04/16/2014 1135   NITRITE NEGATIVE 08/16/2015 0655   LEUKOCYTESUR SMALL (A) 08/16/2015 0655   Sepsis Labs: @LABRCNTIP (procalcitonin:4,lacticidven:4) ) Recent Results (from the past 240 hour(s))  MRSA PCR Screening     Status: Abnormal   Collection Time: 11/23/17  3:40 AM  Result Value Ref Range Status   MRSA by PCR POSITIVE (A) NEGATIVE Corrected    Comment: MISTY SMART 11/23/17 @ 0902 LSCHMIDT        The GeneXpert MRSA Assay (FDA approved for NASAL specimens only), is one component of a comprehensive MRSA colonization surveillance program. It is not intended to diagnose MRSA infection nor to guide or monitor treatment for MRSA infections. Performed at [facility], [address], [place], [place] [address] CORRECTED ON 06/04  AT 2409: PREVIOUSLY REPORTED AS POSITIVE        The GeneXpert MRSA Assay (FDA approved for NASAL specimens only), is one component of a comprehensive MRSA colonization surveillance program. It is not intended to diagnose MRSA infection  nor to guide or monitor treatment for MRSA infections. RESULT CALLED TO, READ BACK BY AND VERIFIED WITH: MISTY SMART 11/23/17 @ 0902N LSCHMIDT   Respiratory Panel by PCR     Status: Abnormal   Collection Time: 11/24/17 11:40 AM  Result Value Ref Range Status   Adenovirus NOT DETECTED NOT DETECTED Final   Coronavirus 229E NOT DETECTED NOT DETECTED Final   Coronavirus HKU1 NOT DETECTED NOT DETECTED Final   Coronavirus NL63 NOT DETECTED NOT DETECTED Final  Coronavirus OC43 NOT DETECTED NOT DETECTED Final   Metapneumovirus NOT DETECTED NOT DETECTED Final   Rhinovirus / Enterovirus NOT DETECTED NOT DETECTED Final   Influenza A NOT DETECTED NOT DETECTED Final   Influenza B NOT DETECTED NOT DETECTED Final   Parainfluenza Virus 1 NOT DETECTED NOT DETECTED Final   Parainfluenza Virus 2 NOT DETECTED NOT DETECTED Final   Parainfluenza Virus 3 DETECTED (A) NOT DETECTED Final   Parainfluenza Virus 4 NOT DETECTED NOT DETECTED Final   Respiratory Syncytial Virus NOT DETECTED NOT DETECTED Final   Bordetella pertussis NOT DETECTED NOT DETECTED Final   Chlamydophila pneumoniae NOT DETECTED NOT DETECTED Final   Mycoplasma pneumoniae NOT DETECTED NOT DETECTED Final     Scheduled Meds: . aspirin EC  81 mg Oral Daily  . atorvastatin  80 mg Oral Daily  . budesonide (PULMICORT) nebulizer solution  0.5 mg Nebulization BID  . calcium acetate  667 mg Oral TID WC  . Chlorhexidine Gluconate Cloth  6 each Topical Q0600  . Chlorhexidine Gluconate Cloth  6 each Topical Q0600  . clopidogrel  75 mg Oral Daily  . doxycycline  100 mg Oral BID  . epoetin (EPOGEN/PROCRIT) injection  2,000 Units Intravenous Q M,W,F-HD  . ipratropium  0.5 mg Nebulization Q6H  . levalbuterol  1.25 mg  Nebulization Q6H  . loratadine  10 mg Oral Daily  . methylPREDNISolone (SOLU-MEDROL) injection  60 mg Intravenous Q6H  . metoprolol succinate  50 mg Oral BID  . mupirocin ointment  1 application Nasal BID  . sodium chloride flush  3 mL Intravenous Q12H   Continuous Infusions: . sodium chloride    . sodium chloride    . sodium chloride      Procedures/Studies: Dg Chest 2 View  Result Date: 11/21/2017 CLINICAL DATA:  Shortness of breath and coughing. EXAM: CHEST - 2 VIEW COMPARISON:  July 16, 2017 FINDINGS: Atelectasis in the bases. No pneumothorax. Cardiomegaly. No other abnormalities. IMPRESSION: No active cardiopulmonary disease. Electronically Signed   By: Dorise Bullion III M.D   On: 11/21/2017 13:46   Dg Chest Port 1 View  Result Date: 11/22/2017 CLINICAL DATA:  Shortness of breath. EXAM: PORTABLE CHEST 1 VIEW COMPARISON:  Frontal and lateral views yesterday. FINDINGS: Stable hyperinflation. Bibasilar atelectasis or scarring. Unchanged cardiomegaly and mediastinal contours. No pulmonary edema, confluent airspace disease or large pleural effusion. No pneumothorax. IMPRESSION: Similar bibasilar atelectasis or scarring.  No new abnormality. Electronically Signed   By: Jeb Levering M.D.   On: 11/22/2017 22:34   Dg Knee Complete 4 Views Left  Result Date: 11/10/2017 CLINICAL DATA:  Left knee pain after fall 2 days ago. EXAM: LEFT KNEE - COMPLETE 4+ VIEW COMPARISON:  None. FINDINGS: No evidence of fracture, dislocation, or joint effusion. No evidence of arthropathy or other focal bone abnormality. Vascular calcifications are noted. IMPRESSION: No acute abnormality seen in the left knee. Electronically Signed   By: Marijo Conception, M.D.   On: 11/10/2017 16:21    Orson Eva, DO  [facility] Pager [phone]  If 7PM-7AM, please contact night-coverage [email or web address] Password [email or web address] 11/25/2017, 5:09 PM   LOS: 2 days

## 2017-11-26 LAB — RENAL FUNCTION PANEL
Albumin: 3.3 g/dL — ABNORMAL LOW (ref 3.5–5.0)
Anion gap: 13 (ref 5–15)
BUN: 79 mg/dL — ABNORMAL HIGH (ref 6–20)
CHLORIDE: 90 mmol/L — AB (ref 101–111)
CO2: 28 mmol/L (ref 22–32)
Calcium: 8.6 mg/dL — ABNORMAL LOW (ref 8.9–10.3)
Creatinine, Ser: 5.76 mg/dL — ABNORMAL HIGH (ref 0.61–1.24)
GFR calc non Af Amer: 8 mL/min — ABNORMAL LOW (ref >60)
GFR, EST AFRICAN AMERICAN: 10 mL/min — AB (ref >60)
Glucose, Bld: 162 mg/dL — ABNORMAL HIGH (ref 65–99)
POTASSIUM: 4.6 mmol/L (ref 3.5–5.1)
Phosphorus: 5.4 mg/dL — ABNORMAL HIGH (ref 2.5–4.6)
Sodium: 131 mmol/L — ABNORMAL LOW (ref 135–145)

## 2017-11-26 LAB — CBC
HEMATOCRIT: 32.6 % — AB (ref 39.0–52.0)
Hemoglobin: 9.8 g/dL — ABNORMAL LOW (ref 13.0–17.0)
MCH: 30.9 pg (ref 26.0–34.0)
MCHC: 30.1 g/dL (ref 30.0–36.0)
MCV: 102.8 fL — AB (ref 78.0–100.0)
Platelets: 297 10*3/uL (ref 150–400)
RBC: 3.17 MIL/uL — AB (ref 4.22–5.81)
RDW: 18.3 % — ABNORMAL HIGH (ref 11.5–15.5)
WBC: 15.1 10*3/uL — ABNORMAL HIGH (ref 4.0–10.5)

## 2017-11-26 MED ORDER — HYDROCODONE-HOMATROPINE 5-1.5 MG/5ML PO SYRP
5.0000 mL | ORAL_SOLUTION | Freq: Once | ORAL | Status: AC
  Administered 2017-11-26: 5 mL via ORAL

## 2017-11-26 MED ORDER — METHYLPREDNISOLONE SODIUM SUCC 125 MG IJ SOLR
60.0000 mg | Freq: Four times a day (QID) | INTRAMUSCULAR | Status: AC
  Administered 2017-11-26: 60 mg via INTRAVENOUS
  Filled 2017-11-26: qty 2

## 2017-11-26 MED ORDER — PREDNISONE 20 MG PO TABS
60.0000 mg | ORAL_TABLET | Freq: Every day | ORAL | Status: DC
Start: 2017-11-27 — End: 2017-11-27
  Administered 2017-11-27: 60 mg via ORAL
  Filled 2017-11-26: qty 3

## 2017-11-26 MED ORDER — DM-GUAIFENESIN ER 30-600 MG PO TB12: 1.0000 | ORAL_TABLET | Freq: Two times a day (BID) | ORAL | Status: DC

## 2017-11-26 MED ORDER — HYDROCODONE-HOMATROPINE 5-1.5 MG/5ML PO SYRP
5.0000 mL | ORAL_SOLUTION | ORAL | Status: DC | PRN
  Administered 2017-11-26 – 2017-11-27 (×3): 5 mL via ORAL
  Filled 2017-11-26 (×4): qty 5

## 2017-11-26 MED ORDER — ARFORMOTEROL TARTRATE 15 MCG/2ML IN NEBU
15.0000 ug | INHALATION_SOLUTION | Freq: Two times a day (BID) | RESPIRATORY_TRACT | Status: DC
  Administered 2017-11-26 – 2017-11-27 (×2): 15 ug via RESPIRATORY_TRACT
  Filled 2017-11-26 (×2): qty 2

## 2017-11-26 MED ORDER — EPOETIN ALFA 2000 UNIT/ML IJ SOLN
INTRAMUSCULAR | Status: AC
  Administered 2017-11-26: 2000 [IU] via INTRAVENOUS
  Filled 2017-11-26: qty 1

## 2017-11-26 MED ORDER — GUAIFENESIN ER 600 MG PO TB12
600.0000 mg | ORAL_TABLET | Freq: Two times a day (BID) | ORAL | Status: DC
  Administered 2017-11-26 – 2017-11-27 (×2): 600 mg via ORAL
  Filled 2017-11-26 (×2): qty 1

## 2017-11-26 NOTE — Care Management Important Message (Signed)
Important Message  Patient Details  Name: Terry [name]. MRN: [id number] Date of Birth: 11-10-1934   Medicare Important Message Given:  Yes    Shelda Altes 11/26/2017, 12:19 PM

## 2017-11-26 NOTE — Progress Notes (Signed)
PROGRESS NOTE  Terry [name]. XNA:[id number] DOB: [birth year] DOA: 11/22/2017 PCP: [name], DO  Brief History: 83yo male with history of PAF, HTN, HLD, CVA, COPD, CAD, ESRD on HDpresented with 1 to 2-day history of worsening shortness of breath. The patient was actually seen in the emergency department at [facility] on 11/21/2017 and discharged home in stable condition with bronchitis. The patient continued to have worsening shortness of breath while he was on dialysis on 11/22/2017. As result, EMS was activated. Upon EMS arrival, O2 oxygen saturation was 81% on room air. The patient was started on BiPAP. He has been since weaned off. The patient was started on bronchodilators and nephrology was consulted for maintenance dialysis. He was admitted to [facility] in March 2019 with SOB. He was in atrial fib and treated for pneumonia and COPD exacerbation. Cardiac cath with severe RCA disease (70% ostial stenosis, 95% mid stenosis, 50% LAD proximal stenosis, Circumflex 40% mid stenosis.   Assessment/Plan: Acute on chronic respiratory failure with hypoxia -Patient normally on 1 L oxygen at night -Presently stable on 3 L -Secondary COPD exacerbation -Wean oxygen as tolerated  COPD exacerbation -Continue Pulmicort -Continue Xopenex and Atrovent -Continue IV Solu-Medrol>>>wean to prednisone -viral respiratory panel-->Positive parainfluenza  Atrial fibrillation with RVR -secondary to COPD exacerbation -CHADSVASc = 4 -not on AC due to hx of GI Bleeding -continue ASA and Plavix -continue metoprolol succinate -HR improving with tx of COPD  CAD -08/2017--s/p PCI to RCA at [facility]--DES x 2 -no chest pain presently -continue metoprolol succinate -continue statin -continue ASA and plavix  ESRD -per nephrology -HD on MWF -metabolic bone disease per renal -last dialysis on 11/26/17/v  Aortic Stenosis -currently being worked up at [facility] for [place] -Echo  March 2019 with LVEF=55%, severe AS with AVA 0.7 cm2, mean gradient 21 mmHg, mild LVH     Disposition Plan: Home 6/8 if stable Family Communication:Spouse updatedat bedside 6/7   Consultants:none  Code Status: FULL  DVT Prophylaxis: SCDs   Procedures: As Listed in Progress Note Above  Antibiotics: Doxy 6/3>>>6/6      Subjective: Overall breathing better.  Denies cp, n//v/d.  Still has some dyspnea on exertion and nonproductive cough.  Objective: Vitals:   11/26/17 1530 11/26/17 1600 11/26/17 1630 11/26/17 1645  BP: (!) 141/53 (!) 134/53 (!) 112/56 128/61  Pulse: 100 96 72 (!) 107  Resp:    20  Temp:      TempSrc:      SpO2:      Weight:      Height:        Intake/Output Summary (Last 24 hours) at 11/26/2017 1726 Last data filed at 11/26/2017 1630 Gross per 24 hour  Intake 480 ml  Output 2500 ml  Net -2020 ml   Weight change:  Exam:   General:  Pt is alert, follows commands appropriately, not in acute distress  HEENT: No icterus, No thrush, No neck mass, [place]/AT  Cardiovascular: RRR, S1/S2, no rubs, no gallops  Respiratory: bibasilar rales, minimal basilar wheeze  Abdomen: Soft/+BS, non tender, non distended, no guarding  Extremities: trace LE edema, No lymphangitis, No petechiae, No rashes, no synovitis   Data Reviewed: I have personally reviewed following labs and imaging studies Basic Metabolic Panel: Recent Labs  Lab 11/21/17 1313 11/22/17 2145 11/23/17 0415 11/24/17 0336 11/26/17 0610  NA 131* 135 139 139 131*  K 4.4 3.6 3.7 4.5 4.6  CL 88*  88* 95* 92* 90*  CO2 28 35* 31 33* 28  GLUCOSE 106* 133* 217* 139* 162*  BUN 40* 29* 37* 65* 79*  CREATININE 5.84* 3.46* 4.02* 6.19* 5.76*  CALCIUM 9.0 8.5* 8.5* 8.8* 8.6*  PHOS  --   --   --  4.3 5.4*   Liver Function Tests: Recent Labs  Lab 11/22/17 2145 11/23/17 0415 11/24/17 0336 11/26/17 0610  AST 26 25  --   --   ALT 16* 17  --   --   ALKPHOS 60 54  --   --     BILITOT 1.0 0.8  --   --   PROT 7.5 7.0  --   --   ALBUMIN 3.7 3.6 3.5 3.3*   No results for input(s): LIPASE, AMYLASE in the last 168 hours. No results for input(s): AMMONIA in the last 168 hours. Coagulation Profile: No results for input(s): INR, PROTIME in the last 168 hours. CBC: Recent Labs  Lab 11/21/17 1313 11/22/17 2145 11/23/17 0434 11/24/17 0336 11/26/17 0610  WBC 9.2 9.0 9.9 14.3* 15.1*  NEUTROABS  --  6.6  --   --   --   HGB 10.2* 10.6* 9.8* 9.5* 9.8*  HCT 33.2* 34.9* 32.2* 31.4* 32.6*  MCV 103.1* 103.9* 104.9* 104.3* 102.8*  PLT 247 264 252 274 297   Cardiac Enzymes: Recent Labs  Lab 11/22/17 2145 11/23/17 0415 11/23/17 1006 11/23/17 1536  TROPONINI 0.04* 0.04* 0.03* 0.03*   BNP: Invalid input(s): POCBNP CBG: No results for input(s): GLUCAP in the last 168 hours. HbA1C: No results for input(s): HGBA1C in the last 72 hours. Urine analysis:    Component Value Date/Time   COLORURINE AMBER (A) 08/16/2015 0655   APPEARANCEUR HAZY (A) 08/16/2015 0655   LABSPEC 1.023 08/16/2015 0655   PHURINE 5.0 08/16/2015 0655   GLUCOSEU 100 (A) 08/16/2015 0655   HGBUR NEGATIVE 08/16/2015 0655   BILIRUBINUR SMALL (A) 08/16/2015 0655   KETONESUR 15 (A) 08/16/2015 0655   PROTEINUR >300 (A) 08/16/2015 0655   UROBILINOGEN 0.2 04/16/2014 1135   NITRITE NEGATIVE 08/16/2015 0655   LEUKOCYTESUR SMALL (A) 08/16/2015 0655   Sepsis Labs: @LABRCNTIP (procalcitonin:4,lacticidven:4) ) Recent Results (from the past 240 hour(s))  MRSA PCR Screening     Status: Abnormal   Collection Time: 11/23/17  3:40 AM  Result Value Ref Range Status   MRSA by PCR POSITIVE (A) NEGATIVE Corrected    Comment: MISTY SMART 11/23/17 @ 0902 LSCHMIDT        The GeneXpert MRSA Assay (FDA approved for NASAL specimens only), is one component of a comprehensive MRSA colonization surveillance program. It is not intended to diagnose MRSA infection nor to guide or monitor treatment for MRSA  infections. Performed at [facility], [address], [place], [place] [address] CORRECTED ON 06/04 AT 9983: PREVIOUSLY REPORTED AS POSITIVE        The GeneXpert MRSA Assay (FDA approved for NASAL specimens only), is one component of a comprehensive MRSA colonization surveillance program. It is not intended to diagnose MRSA infection  nor to guide or monitor treatment for MRSA infections. RESULT CALLED TO, READ BACK BY AND VERIFIED WITH: MISTY SMART 11/23/17 @ 0902N LSCHMIDT   Respiratory Panel by PCR     Status: Abnormal   Collection Time: 11/24/17 11:40 AM  Result Value Ref Range Status   Adenovirus NOT DETECTED NOT DETECTED Final   Coronavirus 229E NOT DETECTED NOT DETECTED Final   Coronavirus HKU1 NOT DETECTED NOT DETECTED Final   Coronavirus  NL63 NOT DETECTED NOT DETECTED Final   Coronavirus OC43 NOT DETECTED NOT DETECTED Final   Metapneumovirus NOT DETECTED NOT DETECTED Final   Rhinovirus / Enterovirus NOT DETECTED NOT DETECTED Final   Influenza A NOT DETECTED NOT DETECTED Final   Influenza B NOT DETECTED NOT DETECTED Final   Parainfluenza Virus 1 NOT DETECTED NOT DETECTED Final   Parainfluenza Virus 2 NOT DETECTED NOT DETECTED Final   Parainfluenza Virus 3 DETECTED (A) NOT DETECTED Final   Parainfluenza Virus 4 NOT DETECTED NOT DETECTED Final   Respiratory Syncytial Virus NOT DETECTED NOT DETECTED Final   Bordetella pertussis NOT DETECTED NOT DETECTED Final   Chlamydophila pneumoniae NOT DETECTED NOT DETECTED Final   Mycoplasma pneumoniae NOT DETECTED NOT DETECTED Final     Scheduled Meds: . aspirin EC  81 mg Oral Daily  . atorvastatin  80 mg Oral Daily  . budesonide (PULMICORT) nebulizer solution  0.5 mg Nebulization BID  . calcium acetate  667 mg Oral TID WC  . Chlorhexidine Gluconate Cloth  6 each Topical Q0600  . Chlorhexidine Gluconate Cloth  6 each Topical Q0600  . clopidogrel  75 mg Oral Daily  . epoetin (EPOGEN/PROCRIT) injection  2,000 Units Intravenous Q M,W,F-HD  .  ipratropium  0.5 mg Nebulization Q6H  . levalbuterol  1.25 mg Nebulization Q6H  . loratadine  10 mg Oral Daily  . methylPREDNISolone (SOLU-MEDROL) injection  60 mg Intravenous Q6H  . metoprolol succinate  50 mg Oral BID  . mupirocin ointment  1 application Nasal BID  . [START ON 11/27/2017] predniSONE  60 mg Oral Q breakfast  . sodium chloride flush  3 mL Intravenous Q12H   Continuous Infusions: . sodium chloride    . sodium chloride    . sodium chloride      Procedures/Studies: Dg Chest 2 View  Result Date: 11/21/2017 CLINICAL DATA:  Shortness of breath and coughing. EXAM: CHEST - 2 VIEW COMPARISON:  July 16, 2017 FINDINGS: Atelectasis in the bases. No pneumothorax. Cardiomegaly. No other abnormalities. IMPRESSION: No active cardiopulmonary disease. Electronically Signed   By: Dorise Bullion III M.D   On: 11/21/2017 13:46   Dg Chest Port 1 View  Result Date: 11/22/2017 CLINICAL DATA:  Shortness of breath. EXAM: PORTABLE CHEST 1 VIEW COMPARISON:  Frontal and lateral views yesterday. FINDINGS: Stable hyperinflation. Bibasilar atelectasis or scarring. Unchanged cardiomegaly and mediastinal contours. No pulmonary edema, confluent airspace disease or large pleural effusion. No pneumothorax. IMPRESSION: Similar bibasilar atelectasis or scarring.  No new abnormality. Electronically Signed   By: Jeb Levering M.D.   On: 11/22/2017 22:34   Dg Knee Complete 4 Views Left  Result Date: 11/10/2017 CLINICAL DATA:  Left knee pain after fall 2 days ago. EXAM: LEFT KNEE - COMPLETE 4+ VIEW COMPARISON:  None. FINDINGS: No evidence of fracture, dislocation, or joint effusion. No evidence of arthropathy or other focal bone abnormality. Vascular calcifications are noted. IMPRESSION: No acute abnormality seen in the left knee. Electronically Signed   By: Marijo Conception, M.D.   On: 11/10/2017 16:21    Orson Eva, DO  [facility] Pager [phone]  If 7PM-7AM, please contact  night-coverage [email or web address] Password [email or web address] 11/26/2017, 5:26 PM   LOS: 3 days

## 2017-11-26 NOTE — Care Management Important Message (Signed)
Important Message  Patient Details  Name: Terry [name]. MRN: [id number] Date of Birth: Dec 24, 1934   Medicare Important Message Given:  Yes    Posie Lillibridge, Chauncey Reading, RN 11/26/2017, 11:44 AM

## 2017-11-26 NOTE — Procedures (Signed)
     HEMODIALYSIS TREATMENT NOTE:    4 hour heparin-free dialysis completed via right upper arm AVF (16g/antegrade). Goal met: 2.5L removed without interruption in ultrafiltration.  All blood was returned and hemostasis was achieved in 15 minutes.  Angela Poteat, RN, CDN 

## 2017-11-26 NOTE — Progress Notes (Signed)
Subjective: Interval History: Patient states that still he has some cough and sputum production.  He feels a slightly better.  Denies any difficulty breathing.  Objective: Vital signs in last 24 hours: Temp:  [97.7 F (36.5 C)] 97.7 F (36.5 C) (06/06 2046) Pulse Rate:  [80-97] 88 (06/07 0539) Resp:  [16-20] 20 (06/07 0539) BP: (114-143)/(56-81) 114/56 (06/07 0539) SpO2:  [91 %-97 %] 96 % (06/07 0539) Weight change:   Intake/Output from previous day: No intake/output data recorded. Intake/Output this shift: No intake/output data recorded.  General appearance: alert, cooperative and no distress Resp: wheezes bilaterally Cardio: regular rate and rhythm Extremities: No edema  Lab Results: Recent Labs    11/24/17 0336 11/26/17 0610  WBC 14.3* 15.1*  HGB 9.5* 9.8*  HCT 31.4* 32.6*  PLT 274 297   BMET:  Recent Labs    11/24/17 0336 11/26/17 0610  NA 139 131*  K 4.5 4.6  CL 92* 90*  CO2 33* 28  GLUCOSE 139* 162*  BUN 65* 79*  CREATININE 6.19* 5.76*  CALCIUM 8.8* 8.6*   No results for input(s): PTH in the last 72 hours. Iron Studies: No results for input(s): IRON, TIBC, TRANSFERRIN, FERRITIN in the last 72 hours.  Studies/Results: No results found.  I have reviewed the patient's current medications.  Assessment/Plan: 1] difficulty breathing: Feeling better.  But complains of cough with sputum production. 2] anemia: His hemoglobin is below our target goal but stable.  He is on Epogen 3] bone and mineral disorder: Calcium and phosphorus remains in  range 4] hypertension: His blood pressure is reasonably controlled 5] COPD: Patient is on inhaler 6] atrial fibrillation: His heart rate is controlled Plan: 1] we will make arrangement for patient to get dialysis today and will try to get another 0-3/5 L if systolic blood pressure remains above 90 2] we will check a CBC and renal panel in the morning.    LOS: 3 days   Roselee Tayloe S 11/26/2017,9:09 AM

## 2017-11-27 MED ORDER — HYDROCODONE-HOMATROPINE 5-1.5 MG/5ML PO SYRP: 5.0000 mL | ORAL_SOLUTION | ORAL | 0 refills | Status: DC | PRN

## 2017-11-27 MED ORDER — PREDNISONE 10 MG PO TABS: 60.0000 mg | ORAL_TABLET | Freq: Every day | ORAL | 0 refills | Status: DC

## 2017-11-27 NOTE — Discharge Summary (Signed)
Physician Discharge Summary  Terry [name]. HDQ:[id number] DOB: 1934-11-09 DOA: 11/22/2017  PCP: [name], DO  Admit date: 11/22/2017 Discharge date: 11/27/2017  Admitted From: Home Disposition:  Home  Recommendations for Outpatient Follow-up:  1. Follow up with PCP in 1-2 weeks 2. Please obtain BMP/CBC in one week   Home Health: YES Equipment/Devices: oxygen 2-3 L  Discharge Condition: Stable CODE STATUS: FULL Diet recommendation: Heart Healthy    Brief/Interim Summary: 83yo male with history of PAF, HTN, HLD, CVA, COPD, CAD, ESRD on HDpresented with 1 to 2-day history of worsening shortness of breath. The patient was actually seen in the emergency department at [facility] on 11/21/2017 and discharged home in stable condition with bronchitis. The patient continued to have worsening shortness of breath while he was on dialysis on 11/22/2017. As result, EMS was activated. Upon EMS arrival, O2 oxygen saturation was 81% on room air. The patient was started on BiPAP. He has been since weaned off. The patient was started on bronchodilators and nephrology was consulted for maintenance dialysis. He was admitted to [facility] in March 2019 with SOB. He was in atrial fib and treated for pneumonia and COPD exacerbation. Cardiac cath with severe RCA disease (70% ostial stenosis, 95% mid stenosis, 50% LAD proximal stenosis, Circumflex 40% mid stenosis. During this hospitalization, the patient was started on bronchodilators and IV Solu-Medrol.  The patient showed clinical improvement albeit slow.  Viral respiratory panel showed parainfluenza which is likely the etiology of his COPD exacerbation.  The patient's oxygen demand was gradually weaned.  He remained stable on 2 to 3 L.  He will continue to wean his oxygen at home as he was doing previously prior to admission.  The patient was instructed to follow-up with his pulmonologist, Dr. Melvyn Novas after discharge.    Discharge  Diagnoses:  Acute on chronic respiratory failure with hypoxia -Patient normally on 1 L oxygen at night -Presently stable on 2-3 L -Secondary COPD exacerbation -Wean oxygen as tolerated after d/c which pt was doing previously  COPD exacerbation -Continue Pulmicort -brovana added during the hospitalization -Continue Xopenex and Atrovent -Continue IV Solu-Medrol>>>wean to prednisone -viral respiratory panel-->Positive parainfluenza  Atrial fibrillation with RVR -secondary to COPD exacerbation -CHADSVASc = 4 -not on AC due to hx of GI Bleeding -continue ASA and Plavix -continue metoprolol succinate -HRimproving with tx of COPD  CAD -08/2017--s/p PCI to RCA at [facility]--DES x 2 -no chest pain presently -continue metoprolol succinate -continue statin -continue ASA and plavix  ESRD -per nephrology -HD on MWF -metabolic bone disease per renal -last dialysis on 11/26/17  Aortic Stenosis -currently being worked up at [facility] for [place] -Echo March 2019 with LVEF=55%, severe AS with AVA 0.7 cm2, mean gradient 21 mmHg, mild LVH      Discharge Instructions   Allergies as of 11/27/2017      Reactions   Yellow Dyes (non-tartrazine) Other (See Comments)   Burns arms Cough medications (tussionex and benzonatate ok)   Levaquin [levofloxacin In D5w] Itching   Levofloxacin Nausea And Vomiting      Medication List    STOP taking these medications   Aclidinium Bromide 400 MCG/ACT Aepb Commonly known as:  TUDORZA PRESSAIR   benzonatate 100 MG capsule Commonly known as:  TESSALON   doxycycline 100 MG capsule Commonly known as:  VIBRAMYCIN   lisinopril 20 MG tablet Commonly known as:  PRINIVIL,ZESTRIL     TAKE these medications   acetaminophen 500 MG tablet Commonly known as:  TYLENOL Take 1,000 mg by mouth as needed for mild pain or headache.   albuterol 108 (90 Base) MCG/ACT inhaler Commonly known as:  PROVENTIL HFA;VENTOLIN HFA Inhale 2 puffs into the lungs every 4  (four) hours as needed for wheezing or shortness of breath.   albuterol (2.5 MG/3ML) 0.083% nebulizer solution Commonly known as:  PROVENTIL Take 3 mLs (2.5 mg total) by nebulization every 4 (four) hours as needed for wheezing or shortness of breath.   aspirin EC 81 MG tablet Take 81 mg by mouth daily.   atorvastatin 80 MG tablet Commonly known as:  LIPITOR Take 1 tablet (80 mg total) by mouth daily.   budesonide-formoterol 160-4.5 MCG/ACT inhaler Commonly known as:  SYMBICORT Inhale 2 puffs into the lungs 2 (two) times daily.   calcium acetate 667 MG capsule Commonly known as:  PHOSLO Take 667-1,334 mg by mouth 3 (three) times daily with meals.   cetirizine 10 MG tablet Commonly known as:  ZYRTEC Take 10 mg by mouth as needed for allergies.   clopidogrel 75 MG tablet Commonly known as:  PLAVIX Take 1 tablet (75 mg total) by mouth daily.   dextromethorphan-guaiFENesin 30-600 MG 12hr tablet Commonly known as:  MUCINEX DM Take 1 tablet by mouth 2 (two) times daily as needed for cough.   ethyl chloride spray Apply 1 application topically See admin instructions. For dialysis   Glycopyrrolate-Formoterol 9-4.8 MCG/ACT Aero Commonly known as:  BEVESPI AEROSPHERE Inhale 2 puffs into the lungs 2 (two) times daily.   HYDROcodone-homatropine 5-1.5 MG/5ML syrup Commonly known as:  HYCODAN Take 5 mLs by mouth every 4 (four) hours as needed for cough.   methocarbamol 500 MG tablet Commonly known as:  ROBAXIN Take 500 mg by mouth 2 (two) times daily as needed for muscle spasms.   metoprolol succinate 50 MG 24 hr tablet Commonly known as:  TOPROL-XL Take 50 mg by mouth 2 (two) times daily. Take with or immediately following a meal.   midodrine 10 MG tablet Commonly known as:  PROAMATINE Take 10 mg by mouth as needed. At dialysis for low pressure   OXYGEN Inhale 1-3 L into the lungs daily.   oxymetazoline 0.05 % nasal spray Commonly known as:  AFRIN Place 1 spray into both  nostrils once as needed for congestion.   predniSONE 5 MG tablet Commonly known as:  DELTASONE Take 2 tablets (10 mg total) by mouth daily with breakfast. What changed:  Another medication with the same name was added. Make sure you understand how and when to take each.   predniSONE 10 MG tablet Commonly known as:  DELTASONE Take 6 tablets (60 mg total) by mouth daily with breakfast. And decrease by one tablet daily Start taking on:  11/28/2017 What changed:  You were already taking a medication with the same name, and this prescription was added. Make sure you understand how and when to take each.       Allergies  Allergen Reactions  . Yellow Dyes (Non-Tartrazine) Other (See Comments)    Burns arms Cough medications (tussionex and benzonatate ok)  . Levaquin [Levofloxacin In D5w] Itching  . Levofloxacin Nausea And Vomiting    Consultations:  none   Procedures/Studies: Dg Chest 2 View  Result Date: 11/21/2017 CLINICAL DATA:  Shortness of breath and coughing. EXAM: CHEST - 2 VIEW COMPARISON:  July 16, 2017 FINDINGS: Atelectasis in the bases. No pneumothorax. Cardiomegaly. No other abnormalities. IMPRESSION: No active cardiopulmonary disease. Electronically Signed   By: Dorise Bullion  III M.D   On: 11/21/2017 13:46   Dg Chest Port 1 View  Result Date: 11/22/2017 CLINICAL DATA:  Shortness of breath. EXAM: PORTABLE CHEST 1 VIEW COMPARISON:  Frontal and lateral views yesterday. FINDINGS: Stable hyperinflation. Bibasilar atelectasis or scarring. Unchanged cardiomegaly and mediastinal contours. No pulmonary edema, confluent airspace disease or large pleural effusion. No pneumothorax. IMPRESSION: Similar bibasilar atelectasis or scarring.  No new abnormality. Electronically Signed   By: Jeb Levering M.D.   On: 11/22/2017 22:34   Dg Knee Complete 4 Views Left  Result Date: 11/10/2017 CLINICAL DATA:  Left knee pain after fall 2 days ago. EXAM: LEFT KNEE - COMPLETE 4+ VIEW  COMPARISON:  None. FINDINGS: No evidence of fracture, dislocation, or joint effusion. No evidence of arthropathy or other focal bone abnormality. Vascular calcifications are noted. IMPRESSION: No acute abnormality seen in the left knee. Electronically Signed   By: Marijo Conception, M.D.   On: 11/10/2017 16:21        Discharge Exam: Vitals:   11/27/17 0551 11/27/17 0759  BP: 116/75   Pulse: 98   Resp: 20   Temp: (!) 97.5 F (36.4 C)   SpO2: 95% 99%   Vitals:   11/26/17 2249 11/27/17 0132 11/27/17 0551 11/27/17 0759  BP: (!) 131/57  116/75   Pulse: (!) 133  98   Resp: 20  20   Temp: (!) 97.3 F (36.3 C)  (!) 97.5 F (36.4 C)   TempSrc: Oral  Oral   SpO2: 91% 96% 95% 99%  Weight:      Height:        General: Pt is alert, awake, not in acute distress Cardiovascular: IRRR, S1/S2 +, no rubs, no gallops Respiratory: Diminished breath sounds bilateral.  Bibasilar rales.  No wheezing.  Good air movement. Abdominal: Soft, NT, ND, bowel sounds + Extremities: no edema, no cyanosis   The results of significant diagnostics from this hospitalization (including imaging, microbiology, ancillary and laboratory) are listed below for reference.    Significant Diagnostic Studies: Dg Chest 2 View  Result Date: 11/21/2017 CLINICAL DATA:  Shortness of breath and coughing. EXAM: CHEST - 2 VIEW COMPARISON:  July 16, 2017 FINDINGS: Atelectasis in the bases. No pneumothorax. Cardiomegaly. No other abnormalities. IMPRESSION: No active cardiopulmonary disease. Electronically Signed   By: Dorise Bullion III M.D   On: 11/21/2017 13:46   Dg Chest Port 1 View  Result Date: 11/22/2017 CLINICAL DATA:  Shortness of breath. EXAM: PORTABLE CHEST 1 VIEW COMPARISON:  Frontal and lateral views yesterday. FINDINGS: Stable hyperinflation. Bibasilar atelectasis or scarring. Unchanged cardiomegaly and mediastinal contours. No pulmonary edema, confluent airspace disease or large pleural effusion. No pneumothorax.  IMPRESSION: Similar bibasilar atelectasis or scarring.  No new abnormality. Electronically Signed   By: Jeb Levering M.D.   On: 11/22/2017 22:34   Dg Knee Complete 4 Views Left  Result Date: 11/10/2017 CLINICAL DATA:  Left knee pain after fall 2 days ago. EXAM: LEFT KNEE - COMPLETE 4+ VIEW COMPARISON:  None. FINDINGS: No evidence of fracture, dislocation, or joint effusion. No evidence of arthropathy or other focal bone abnormality. Vascular calcifications are noted. IMPRESSION: No acute abnormality seen in the left knee. Electronically Signed   By: Marijo Conception, M.D.   On: 11/10/2017 16:21     Microbiology: Recent Results (from the past 240 hour(s))  MRSA PCR Screening     Status: Abnormal   Collection Time: 11/23/17  3:40 AM  Result Value Ref Range Status  MRSA by PCR POSITIVE (A) NEGATIVE Corrected    Comment: MISTY SMART 11/23/17 @ 57 LSCHMIDT        The GeneXpert MRSA Assay (FDA approved for NASAL specimens only), is one component of a comprehensive MRSA colonization surveillance program. It is not intended to diagnose MRSA infection nor to guide or monitor treatment for MRSA infections. Performed at [facility], [address], [place], [place] [address] CORRECTED ON 06/04 AT 8250: PREVIOUSLY REPORTED AS POSITIVE        The GeneXpert MRSA Assay (FDA approved for NASAL specimens only), is one component of a comprehensive MRSA colonization surveillance program. It is not intended to diagnose MRSA infection  nor to guide or monitor treatment for MRSA infections. RESULT CALLED TO, READ BACK BY AND VERIFIED WITH: MISTY SMART 11/23/17 @ 0902N LSCHMIDT   Respiratory Panel by PCR     Status: Abnormal   Collection Time: 11/24/17 11:40 AM  Result Value Ref Range Status   Adenovirus NOT DETECTED NOT DETECTED Final   Coronavirus 229E NOT DETECTED NOT DETECTED Final   Coronavirus HKU1 NOT DETECTED NOT DETECTED Final   Coronavirus NL63 NOT DETECTED NOT DETECTED Final   Coronavirus  OC43 NOT DETECTED NOT DETECTED Final   Metapneumovirus NOT DETECTED NOT DETECTED Final   Rhinovirus / Enterovirus NOT DETECTED NOT DETECTED Final   Influenza A NOT DETECTED NOT DETECTED Final   Influenza B NOT DETECTED NOT DETECTED Final   Parainfluenza Virus 1 NOT DETECTED NOT DETECTED Final   Parainfluenza Virus 2 NOT DETECTED NOT DETECTED Final   Parainfluenza Virus 3 DETECTED (A) NOT DETECTED Final   Parainfluenza Virus 4 NOT DETECTED NOT DETECTED Final   Respiratory Syncytial Virus NOT DETECTED NOT DETECTED Final   Bordetella pertussis NOT DETECTED NOT DETECTED Final   Chlamydophila pneumoniae NOT DETECTED NOT DETECTED Final   Mycoplasma pneumoniae NOT DETECTED NOT DETECTED Final     Labs: Basic Metabolic Panel: Recent Labs  Lab 11/21/17 1313 11/22/17 2145 11/23/17 0415 11/24/17 0336 11/26/17 0610  NA 131* 135 139 139 131*  K 4.4 3.6 3.7 4.5 4.6  CL 88* 88* 95* 92* 90*  CO2 28 35* 31 33* 28  GLUCOSE 106* 133* 217* 139* 162*  BUN 40* 29* 37* 65* 79*  CREATININE 5.84* 3.46* 4.02* 6.19* 5.76*  CALCIUM 9.0 8.5* 8.5* 8.8* 8.6*  PHOS  --   --   --  4.3 5.4*   Liver Function Tests: Recent Labs  Lab 11/22/17 2145 11/23/17 0415 11/24/17 0336 11/26/17 0610  AST 26 25  --   --   ALT 16* 17  --   --   ALKPHOS 60 54  --   --   BILITOT 1.0 0.8  --   --   PROT 7.5 7.0  --   --   ALBUMIN 3.7 3.6 3.5 3.3*   No results for input(s): LIPASE, AMYLASE in the last 168 hours. No results for input(s): AMMONIA in the last 168 hours. CBC: Recent Labs  Lab 11/21/17 1313 11/22/17 2145 11/23/17 0434 11/24/17 0336 11/26/17 0610  WBC 9.2 9.0 9.9 14.3* 15.1*  NEUTROABS  --  6.6  --   --   --   HGB 10.2* 10.6* 9.8* 9.5* 9.8*  HCT 33.2* 34.9* 32.2* 31.4* 32.6*  MCV 103.1* 103.9* 104.9* 104.3* 102.8*  PLT 247 264 252 274 297   Cardiac Enzymes: Recent Labs  Lab 11/22/17 2145 11/23/17 0415 11/23/17 1006 11/23/17 1536  TROPONINI 0.04* 0.04* 0.03* 0.03*  BNP: Invalid  input(s): POCBNP CBG: No results for input(s): GLUCAP in the last 168 hours.  Time coordinating discharge:  36 minutes  Signed:  Orson Eva, DO [facility] Pager: [phone] 11/27/2017, 10:03 AM

## 2017-11-27 NOTE — Progress Notes (Signed)
Subjective: Interval History: Patient still has some cough but no sputum production. Denies any difficulty in brreathng  Objective: Vital signs in last 24 hours: Temp:  [97.3 F (36.3 C)-98 F (36.7 C)] 97.5 F (36.4 C) (06/08 0551) Pulse Rate:  [72-133] 98 (06/08 0551) Resp:  [20] 20 (06/08 0551) BP: (112-151)/(53-75) 116/75 (06/08 0551) SpO2:  [90 %-99 %] 99 % (06/08 0759) Weight change:   Intake/Output from previous day: 06/07 0701 - 06/08 0700 In: 720 [P.O.:720] Out: 2500  Intake/Output this shift: No intake/output data recorded.  Generally: patient is alert and in no apparent distress Chest is decreased breath sound bilaterally, no rales or rhonchi Heart exam regular rate and rhythm Extremities no edema  Lab Results: Recent Labs    11/26/17 0610  WBC 15.1*  HGB 9.8*  HCT 32.6*  PLT 297   BMET:  Recent Labs    11/26/17 0610  NA 131*  K 4.6  CL 90*  CO2 28  GLUCOSE 162*  BUN 79*  CREATININE 5.76*  CALCIUM 8.6*   No results for input(s): PTH in the last 72 hours. Iron Studies: No results for input(s): IRON, TIBC, TRANSFERRIN, FERRITIN in the last 72 hours.  Studies/Results: No results found.  I have reviewed the patient's current medications.  Assessment/Plan: 1] difficulty breathing: Patient is feeling much better.  Still has some cough but getting better.  At this moment he offers no complaints.  We are able to remove about 2-1/2 L with his dialysis. 2] anemia: His hemoglobin is below our target goal but stable.  He is on Epogen 3] bone and mineral disorder: Calcium and phosphorus remains in  range 4] hypertension: His blood pressure is reasonably controlled 5] COPD: Patient is on inhaler.  As stated above he has some cough otherwise no difficulty breathing or wheezing. 6] atrial fibrillation: His heart rate is controlled 7] end-stage renal disease: He is status post hemodialysis yesterday.  His potassium is normal. Plan: 1] patient does require  dialysis today.  His next dialysis will be on Monday. 2] we will check a CBC and renal panel in the morning.    LOS: 4 days   Naiya Corral S 11/27/2017,9:09 AM

## 2017-11-27 NOTE — Progress Notes (Signed)
Patient IV removed, telemetry removed, tolerated well. Patient and wife given discharge instructions at bedside.

## 2017-11-29 ENCOUNTER — Emergency Department (HOSPITAL_COMMUNITY): Payer: Medicare PPO

## 2017-11-29 ENCOUNTER — Encounter (HOSPITAL_COMMUNITY): Payer: Self-pay

## 2017-11-29 ENCOUNTER — Inpatient Hospital Stay (HOSPITAL_COMMUNITY)
Admission: EM | Admit: 2017-11-29 | Discharge: 2017-12-04 | DRG: 190 | Disposition: A | Discharge status: Home / Self Care | Payer: Medicare PPO | Attending: Family Medicine | Admitting: Family Medicine

## 2017-11-29 ENCOUNTER — Telehealth: Payer: Self-pay | Admitting: *Deleted

## 2017-11-29 ENCOUNTER — Other Ambulatory Visit: Payer: Self-pay

## 2017-11-29 ENCOUNTER — Inpatient Hospital Stay (HOSPITAL_COMMUNITY): Payer: Medicare PPO

## 2017-11-29 DIAGNOSIS — D638 Anemia in other chronic diseases classified elsewhere: Secondary | ICD-10-CM

## 2017-11-29 DIAGNOSIS — I10 Essential (primary) hypertension: Secondary | ICD-10-CM | POA: Diagnosis present

## 2017-11-29 DIAGNOSIS — J440 Chronic obstructive pulmonary disease with acute lower respiratory infection: Secondary | ICD-10-CM | POA: Diagnosis not present

## 2017-11-29 DIAGNOSIS — I132 Hypertensive heart and chronic kidney disease with heart failure and with stage 5 chronic kidney disease, or end stage renal disease: Secondary | ICD-10-CM | POA: Diagnosis not present

## 2017-11-29 DIAGNOSIS — J189 Pneumonia, unspecified organism: Secondary | ICD-10-CM | POA: Diagnosis not present

## 2017-11-29 DIAGNOSIS — E785 Hyperlipidemia, unspecified: Secondary | ICD-10-CM | POA: Diagnosis present

## 2017-11-29 DIAGNOSIS — E8889 Other specified metabolic disorders: Secondary | ICD-10-CM | POA: Diagnosis present

## 2017-11-29 DIAGNOSIS — Z961 Presence of intraocular lens: Secondary | ICD-10-CM | POA: Diagnosis present

## 2017-11-29 DIAGNOSIS — I482 Chronic atrial fibrillation, unspecified: Secondary | ICD-10-CM | POA: Diagnosis present

## 2017-11-29 DIAGNOSIS — Z87891 Personal history of nicotine dependence: Secondary | ICD-10-CM

## 2017-11-29 DIAGNOSIS — J9621 Acute and chronic respiratory failure with hypoxia: Secondary | ICD-10-CM | POA: Diagnosis not present

## 2017-11-29 DIAGNOSIS — Z8673 Personal history of transient ischemic attack (TIA), and cerebral infarction without residual deficits: Secondary | ICD-10-CM

## 2017-11-29 DIAGNOSIS — N186 End stage renal disease: Secondary | ICD-10-CM | POA: Diagnosis not present

## 2017-11-29 DIAGNOSIS — I2510 Atherosclerotic heart disease of native coronary artery without angina pectoris: Secondary | ICD-10-CM | POA: Diagnosis not present

## 2017-11-29 DIAGNOSIS — E875 Hyperkalemia: Secondary | ICD-10-CM | POA: Diagnosis not present

## 2017-11-29 DIAGNOSIS — J441 Chronic obstructive pulmonary disease with (acute) exacerbation: Secondary | ICD-10-CM | POA: Diagnosis present

## 2017-11-29 DIAGNOSIS — Z992 Dependence on renal dialysis: Secondary | ICD-10-CM | POA: Diagnosis not present

## 2017-11-29 DIAGNOSIS — R400 Somnolence: Secondary | ICD-10-CM | POA: Diagnosis not present

## 2017-11-29 DIAGNOSIS — I5033 Acute on chronic diastolic (congestive) heart failure: Secondary | ICD-10-CM | POA: Diagnosis present

## 2017-11-29 DIAGNOSIS — D631 Anemia in chronic kidney disease: Secondary | ICD-10-CM | POA: Diagnosis not present

## 2017-11-29 DIAGNOSIS — Z955 Presence of coronary angioplasty implant and graft: Secondary | ICD-10-CM | POA: Diagnosis not present

## 2017-11-29 DIAGNOSIS — Z8249 Family history of ischemic heart disease and other diseases of the circulatory system: Secondary | ICD-10-CM

## 2017-11-29 DIAGNOSIS — Z9842 Cataract extraction status, left eye: Secondary | ICD-10-CM | POA: Diagnosis not present

## 2017-11-29 DIAGNOSIS — Z9049 Acquired absence of other specified parts of digestive tract: Secondary | ICD-10-CM

## 2017-11-29 DIAGNOSIS — I5043 Acute on chronic combined systolic (congestive) and diastolic (congestive) heart failure: Secondary | ICD-10-CM | POA: Diagnosis not present

## 2017-11-29 DIAGNOSIS — B961 Klebsiella pneumoniae [K. pneumoniae] as the cause of diseases classified elsewhere: Secondary | ICD-10-CM | POA: Diagnosis present

## 2017-11-29 DIAGNOSIS — Z7952 Long term (current) use of systemic steroids: Secondary | ICD-10-CM

## 2017-11-29 DIAGNOSIS — I350 Nonrheumatic aortic (valve) stenosis: Secondary | ICD-10-CM

## 2017-11-29 DIAGNOSIS — E1122 Type 2 diabetes mellitus with diabetic chronic kidney disease: Secondary | ICD-10-CM | POA: Diagnosis present

## 2017-11-29 DIAGNOSIS — Z9841 Cataract extraction status, right eye: Secondary | ICD-10-CM

## 2017-11-29 DIAGNOSIS — R0600 Dyspnea, unspecified: Secondary | ICD-10-CM | POA: Diagnosis not present

## 2017-11-29 DIAGNOSIS — I120 Hypertensive chronic kidney disease with stage 5 chronic kidney disease or end stage renal disease: Secondary | ICD-10-CM | POA: Diagnosis not present

## 2017-11-29 DIAGNOSIS — M62838 Other muscle spasm: Secondary | ICD-10-CM | POA: Diagnosis present

## 2017-11-29 DIAGNOSIS — J9620 Acute and chronic respiratory failure, unspecified whether with hypoxia or hypercapnia: Secondary | ICD-10-CM | POA: Diagnosis not present

## 2017-11-29 DIAGNOSIS — Z9102 Food additives allergy status: Secondary | ICD-10-CM

## 2017-11-29 DIAGNOSIS — Z881 Allergy status to other antibiotic agents status: Secondary | ICD-10-CM

## 2017-11-29 DIAGNOSIS — Z9981 Dependence on supplemental oxygen: Secondary | ICD-10-CM

## 2017-11-29 DIAGNOSIS — J449 Chronic obstructive pulmonary disease, unspecified: Secondary | ICD-10-CM | POA: Diagnosis not present

## 2017-11-29 DIAGNOSIS — R0602 Shortness of breath: Secondary | ICD-10-CM | POA: Diagnosis not present

## 2017-11-29 DIAGNOSIS — R600 Localized edema: Secondary | ICD-10-CM | POA: Diagnosis not present

## 2017-11-29 DIAGNOSIS — N2581 Secondary hyperparathyroidism of renal origin: Secondary | ICD-10-CM | POA: Diagnosis not present

## 2017-11-29 DIAGNOSIS — Z7951 Long term (current) use of inhaled steroids: Secondary | ICD-10-CM

## 2017-11-29 DIAGNOSIS — Z7982 Long term (current) use of aspirin: Secondary | ICD-10-CM

## 2017-11-29 DIAGNOSIS — Z79899 Other long term (current) drug therapy: Secondary | ICD-10-CM

## 2017-11-29 DIAGNOSIS — Z8701 Personal history of pneumonia (recurrent): Secondary | ICD-10-CM

## 2017-11-29 DIAGNOSIS — Z7902 Long term (current) use of antithrombotics/antiplatelets: Secondary | ICD-10-CM

## 2017-11-29 LAB — CBC WITH DIFFERENTIAL/PLATELET
ABS IMMATURE GRANULOCYTES: 0.1 10*3/uL (ref 0.0–0.1)
BASOS ABS: 0 10*3/uL (ref 0.0–0.1)
Basophils Relative: 0 %
Eosinophils Absolute: 0.1 10*3/uL (ref 0.0–0.7)
Eosinophils Relative: 1 %
HCT: 36.2 % — ABNORMAL LOW (ref 39.0–52.0)
HEMOGLOBIN: 11.1 g/dL — AB (ref 13.0–17.0)
IMMATURE GRANULOCYTES: 1 %
LYMPHS PCT: 1 %
Lymphs Abs: 0.1 10*3/uL — ABNORMAL LOW (ref 0.7–4.0)
MCH: 30.8 pg (ref 26.0–34.0)
MCHC: 30.7 g/dL (ref 30.0–36.0)
MCV: 100.6 fL — ABNORMAL HIGH (ref 78.0–100.0)
Monocytes Absolute: 0.6 10*3/uL (ref 0.1–1.0)
Monocytes Relative: 6 %
NEUTROS ABS: 9.5 10*3/uL — AB (ref 1.7–7.7)
Neutrophils Relative %: 91 %
Platelets: 270 10*3/uL (ref 150–400)
RBC: 3.6 MIL/uL — AB (ref 4.22–5.81)
RDW: 18.5 % — ABNORMAL HIGH (ref 11.5–15.5)
WBC: 10.4 10*3/uL (ref 4.0–10.5)

## 2017-11-29 LAB — BASIC METABOLIC PANEL
ANION GAP: 14 (ref 5–15)
BUN: 40 mg/dL — ABNORMAL HIGH (ref 6–20)
CO2: 31 mmol/L (ref 22–32)
Calcium: 7.7 mg/dL — ABNORMAL LOW (ref 8.9–10.3)
Chloride: 93 mmol/L — ABNORMAL LOW (ref 101–111)
Creatinine, Ser: 3.61 mg/dL — ABNORMAL HIGH (ref 0.61–1.24)
GFR calc Af Amer: 17 mL/min — ABNORMAL LOW (ref >60)
GFR, EST NON AFRICAN AMERICAN: 15 mL/min — AB (ref >60)
GLUCOSE: 310 mg/dL — AB (ref 65–99)
POTASSIUM: 4 mmol/L (ref 3.5–5.1)
SODIUM: 138 mmol/L (ref 135–145)

## 2017-11-29 LAB — I-STAT VENOUS BLOOD GAS, ED
Acid-Base Excess: 6 mmol/L — ABNORMAL HIGH (ref 0.0–2.0)
Bicarbonate: 35 mmol/L — ABNORMAL HIGH (ref 20.0–28.0)
O2 Saturation: 28 %
PH VEN: 7.33 (ref 7.250–7.430)
TCO2: 37 mmol/L — ABNORMAL HIGH (ref 22–32)
pCO2, Ven: 66.3 mmHg — ABNORMAL HIGH (ref 44.0–60.0)
pO2, Ven: 21 mmHg — CL (ref 32.0–45.0)

## 2017-11-29 LAB — I-STAT TROPONIN, ED: TROPONIN I, POC: 0.02 ng/mL (ref 0.00–0.08)

## 2017-11-29 LAB — BRAIN NATRIURETIC PEPTIDE: B Natriuretic Peptide: 565.3 pg/mL — ABNORMAL HIGH (ref 0.0–100.0)

## 2017-11-29 MED ORDER — LORATADINE 10 MG PO TABS
10.0000 mg | ORAL_TABLET | Freq: Every day | ORAL | Status: DC
  Administered 2017-11-30 – 2017-12-04 (×4): 10 mg via ORAL
  Filled 2017-11-29 (×4): qty 1

## 2017-11-29 MED ORDER — HEPARIN SODIUM (PORCINE) 5000 UNIT/ML IJ SOLN
5000.0000 [IU] | Freq: Three times a day (TID) | INTRAMUSCULAR | Status: DC
Start: 2017-11-30 — End: 2017-12-04
  Administered 2017-11-30 – 2017-12-04 (×6): 5000 [IU] via SUBCUTANEOUS
  Filled 2017-11-29 (×10): qty 1

## 2017-11-29 MED ORDER — OXYMETAZOLINE HCL 0.05 % NA SOLN
1.0000 | Freq: Once | NASAL | Status: DC | PRN
  Filled 2017-11-29: qty 15

## 2017-11-29 MED ORDER — VANCOMYCIN HCL 10 G IV SOLR
1500.0000 mg | Freq: Once | INTRAVENOUS | Status: AC
  Administered 2017-11-29: 1500 mg via INTRAVENOUS
  Filled 2017-11-29: qty 1500

## 2017-11-29 MED ORDER — IPRATROPIUM-ALBUTEROL 0.5-2.5 (3) MG/3ML IN SOLN
3.0000 mL | Freq: Once | RESPIRATORY_TRACT | Status: AC
  Administered 2017-11-29: 3 mL via RESPIRATORY_TRACT
  Filled 2017-11-29: qty 3

## 2017-11-29 MED ORDER — VANCOMYCIN HCL IN DEXTROSE 750-5 MG/150ML-% IV SOLN: 750.0000 mg | INTRAVENOUS | Status: DC

## 2017-11-29 MED ORDER — CLOPIDOGREL BISULFATE 75 MG PO TABS
75.0000 mg | ORAL_TABLET | Freq: Every day | ORAL | Status: DC
  Administered 2017-11-30 – 2017-12-04 (×5): 75 mg via ORAL
  Filled 2017-11-29 (×5): qty 1

## 2017-11-29 MED ORDER — GUAIFENESIN ER 600 MG PO TB12: 600.0000 mg | ORAL_TABLET | Freq: Two times a day (BID) | ORAL | Status: DC | PRN

## 2017-11-29 MED ORDER — ATORVASTATIN CALCIUM 80 MG PO TABS
80.0000 mg | ORAL_TABLET | Freq: Every day | ORAL | Status: DC
  Administered 2017-11-30 – 2017-12-04 (×5): 80 mg via ORAL
  Filled 2017-11-29 (×5): qty 1

## 2017-11-29 MED ORDER — MOMETASONE FURO-FORMOTEROL FUM 200-5 MCG/ACT IN AERO
2.0000 | INHALATION_SPRAY | Freq: Two times a day (BID) | RESPIRATORY_TRACT | Status: DC
  Administered 2017-12-01 – 2017-12-04 (×5): 2 via RESPIRATORY_TRACT
  Filled 2017-11-29 (×2): qty 8.8

## 2017-11-29 MED ORDER — POLYETHYLENE GLYCOL 3350 17 G PO PACK: 17.0000 g | PACK | Freq: Every day | ORAL | Status: DC | PRN

## 2017-11-29 MED ORDER — METOPROLOL SUCCINATE ER 50 MG PO TB24
50.0000 mg | ORAL_TABLET | Freq: Two times a day (BID) | ORAL | Status: DC
  Administered 2017-11-30 – 2017-12-04 (×9): 50 mg via ORAL
  Filled 2017-11-29 (×10): qty 1

## 2017-11-29 MED ORDER — LEVALBUTEROL HCL 1.25 MG/0.5ML IN NEBU
1.2500 mg | INHALATION_SOLUTION | Freq: Four times a day (QID) | RESPIRATORY_TRACT | Status: DC
  Filled 2017-11-29 (×2): qty 0.5

## 2017-11-29 MED ORDER — ASPIRIN EC 81 MG PO TBEC
81.0000 mg | DELAYED_RELEASE_TABLET | Freq: Every day | ORAL | Status: DC
  Administered 2017-11-30 – 2017-12-04 (×5): 81 mg via ORAL
  Filled 2017-11-29 (×5): qty 1

## 2017-11-29 MED ORDER — PIPERACILLIN-TAZOBACTAM 3.375 G IVPB
3.3750 g | Freq: Two times a day (BID) | INTRAVENOUS | Status: DC
  Administered 2017-11-30 – 2017-12-04 (×10): 3.375 g via INTRAVENOUS
  Filled 2017-11-29 (×10): qty 50

## 2017-11-29 MED ORDER — LEVALBUTEROL HCL 1.25 MG/0.5ML IN NEBU
1.2500 mg | INHALATION_SOLUTION | Freq: Four times a day (QID) | RESPIRATORY_TRACT | Status: DC
Start: 2017-11-30 — End: 2017-11-29

## 2017-11-29 MED ORDER — LEVALBUTEROL HCL 1.25 MG/0.5ML IN NEBU
1.2500 mg | INHALATION_SOLUTION | Freq: Four times a day (QID) | RESPIRATORY_TRACT | Status: DC
Start: 2017-11-30 — End: 2017-12-02
  Administered 2017-11-30 – 2017-12-02 (×11): 1.25 mg via RESPIRATORY_TRACT
  Filled 2017-11-29 (×13): qty 0.5

## 2017-11-29 MED ORDER — IPRATROPIUM BROMIDE 0.02 % IN SOLN: 0.5000 mg | RESPIRATORY_TRACT | Status: DC

## 2017-11-29 MED ORDER — HYDROCODONE-HOMATROPINE 5-1.5 MG/5ML PO SYRP
5.0000 mL | ORAL_SOLUTION | ORAL | Status: DC | PRN
  Administered 2017-12-03 (×2): 5 mL via ORAL
  Filled 2017-11-29 (×2): qty 5

## 2017-11-29 MED ORDER — METHOCARBAMOL 500 MG PO TABS: 500.0000 mg | ORAL_TABLET | Freq: Two times a day (BID) | ORAL | Status: DC | PRN

## 2017-11-29 MED ORDER — ACETAMINOPHEN 325 MG PO TABS
650.0000 mg | ORAL_TABLET | Freq: Four times a day (QID) | ORAL | Status: DC | PRN
Start: 2017-11-29 — End: 2017-12-04

## 2017-11-29 MED ORDER — IPRATROPIUM BROMIDE 0.02 % IN SOLN
0.5000 mg | Freq: Four times a day (QID) | RESPIRATORY_TRACT | Status: DC
  Administered 2017-11-30 – 2017-12-02 (×11): 0.5 mg via RESPIRATORY_TRACT
  Filled 2017-11-29 (×12): qty 2.5

## 2017-11-29 MED ORDER — CALCIUM ACETATE (PHOS BINDER) 667 MG PO CAPS
667.0000 mg | ORAL_CAPSULE | Freq: Three times a day (TID) | ORAL | Status: DC
  Administered 2017-11-30 – 2017-12-02 (×6): 667 mg via ORAL
  Filled 2017-11-29: qty 2
  Filled 2017-11-29 (×7): qty 1

## 2017-11-29 NOTE — ED Provider Notes (Signed)
Patient placed in Quick Look pathway, seen and evaluated   Chief Complaint: shortness of breath  HPI:   Terry [name]. is a 83 y.o. male who presents to the ED from dialysis after patient's O2 SAT dropped during treatment. Patient's wife reports that up until 2 weeks ago the patient did not use O2 at home. 2 weeks ago he started on O2 [place] @ 1L.   ROS: Resp: shortness of breath  Physical Exam: BP 113/62 (BP Location: Left Arm)   Pulse (!) 102   Temp 98 F (36.7 C) (Oral)   Resp 16   SpO2 95%    Gen: No distress  Neuro: Awake and Alert  Skin: Warm and dry  Lungs: decreased breath sound, rales  Heart: tachycardia  Initiation of care has begun. The patient has been counseled on the process, plan, and necessity for staying for the completion/evaluation, and the remainder of the medical screening examination    Ashley Murrain, NP 11/29/17 Thomasena Edis, MD 11/30/17 0006

## 2017-11-29 NOTE — Telephone Encounter (Signed)
Unable to reach patient at time of TCM Call. Left message for patient to return call when available.  

## 2017-11-29 NOTE — ED Provider Notes (Signed)
[facility] EMERGENCY DEPARTMENT Provider Note   CSN: [id number] Arrival date & time: 11/29/17  1742     History   Chief Complaint Chief Complaint  Patient presents with  . Shortness of Breath    HPI Terry [name]. is a 83 y.o. male with a past medical history of ESRD on dialysis MWF, hypertension, COPD currently on home oxygen therapy, CHF, prior CVA, who presents to ED for evaluation of shortness of breath and low oxygen saturations.  He also reports cough productive with green mucus.  Patient was at his dialysis session earlier today when apparently his oxygen saturations dropped.  He was recently placed on home oxygen 3 L at all x2 weeks ago for his COPD.  She is also noticed that the swelling in his lower extremities bilaterally has increased.  He completed his dialysis session today where they removed ~4.5 L of fluid.  His wife states that he is continuing to have shortness of breath.  He was admitted to the hospital from June 2 to June 8 for COPD exacerbation and epistaxis, dx with parainfluenza.  He was discharged home with prednisone taper, restarting his metoprolol.  He has been using his home inhalers and nebulizer treatments with no improvement in his symptoms.  Wife states that he was diagnosed with pneumonia in the past and feels that this is a similar presentation. Denies injuries or falls, history of DVT, PE, vomiting, abdominal pain, chest pain, fever.  HPI  Past Medical History:  Diagnosis Date  . Acute and chronic respiratory failure 10/22/2012  . Anxiety   . Arthritis    "hands, knees" (03/15/2017)  . Atrial fibrillation ([place])   . CAD (coronary artery disease) [place], [name]  . CHF (congestive heart failure) ([place]) 09/2012  . COPD (chronic obstructive pulmonary disease) ([place])   . ESRD (end stage renal disease) on dialysis [facility])    "MWF; [facility], [place]" (03/15/2017)  . Flu 09/24/2016   influenza b  . HCAP  (healthcare-associated pneumonia) 03/15/2017  . High cholesterol   . History of blood transfusion 2012   "related to bowel resection"  . Hypertension   . On home oxygen therapy    "1L typically at night" (03/15/2017)  . Osteoarthritis 10/22/2012  . Pneumonia April 2014; 09/2016  . Shortness of breath dyspnea   . Stroke [facility]) 2012   denies residual on 03/15/2017  . Stroke [facility]) ~ 2013   "small one in his right eye"    Patient Active Problem List   Diagnosis Date Noted  . COPD exacerbation ([place])   . Acute respiratory failure ([place]) 11/23/2017  . Anemia   . Coronavirus infection 06/22/2017  . End stage renal disease on dialysis ([place])   . Hypoxia   . COPD with acute exacerbation ([place]) 06/21/2017  . Sepsis ([place]) 06/21/2017  . Hypertension 03/15/2017  . Chest pain 03/15/2017  . High cholesterol 03/15/2017  . CKD (chronic kidney disease) stage V requiring chronic dialysis ([place]) 03/15/2017  . COPD (chronic obstructive pulmonary disease) GOLD stage III 03/15/2017  . Chronic hypoxemic respiratory failure ([place]) 03/15/2017  . PAF (paroxysmal atrial fibrillation) ([place]) 03/15/2017  . Acute on chronic respiratory failure with hypoxia ([place])   . Pulmonary infiltrates on CXR 02/10/2017  . Upper airway cough syndrome 08/13/2016  . Memory loss of unknown cause 04/08/2016  . Chronic respiratory failure with hypoxia ([place]) 08/27/2015  . ESRD (end stage renal disease) on dialysis ([place]) 07/28/2015  . HCAP (  healthcare-associated pneumonia)   . Pneumothorax, left   . Chronic diastolic heart failure ([place])   . Anemia of chronic disease   . Pneumonia due to Enterobacter cloacae ([place])   . Pneumonia due to Klebsiella pneumoniae ([place])   . Chronic atrial fibrillation ([place])   . CAD (coronary artery disease) 02/04/2015  . Hyperkalemia 04/13/2014  . COPD GOLD IV 10/22/2012  . Other and unspecified hyperlipidemia 10/22/2012  . Osteoarthritis 10/22/2012  . Essential hypertension 10/22/2012    Past Surgical  History:  Procedure Laterality Date  . APPENDECTOMY  1984  . AV FISTULA PLACEMENT Right 05/21/2015   Procedure: Right Arm Brachiocephalic ARTERIOVENOUS (AV) FISTULA CREATION;  Surgeon: Angelia Mould, MD;  Location: [facility];  Service: Vascular;  Laterality: Right;  . CATARACT EXTRACTION W/ INTRAOCULAR LENS  IMPLANT, BILATERAL Bilateral   . COLECTOMY  2012   partial colon removed - twisted bowel  . CORONARY ANGIOPLASTY WITH STENT PLACEMENT  1999   2 stents  . CYSTOSCOPY/RETROGRADE/URETEROSCOPY Bilateral 04/23/2014   Procedure: CYSTOSCOPY BILATERAL RETROGRADE,  LEFT URETEROSCOPY, LEFT STENT PLACEMENT;  Surgeon: Raynelle Bring, MD;  Location: [facility];  Service: Urology;  Laterality: Bilateral;  . EYE SURGERY Left    "surgery to clean off lens after cataract OR"  . INGUINAL HERNIA REPAIR Right         Home Medications    Prior to Admission medications   Medication Sig Start Date End Date Taking? Authorizing Provider  acetaminophen (TYLENOL) 500 MG tablet Take 1,000 mg by mouth as needed for mild pain or headache.   Yes [provider]  albuterol (PROVENTIL HFA;VENTOLIN HFA) 108 (90 Base) MCG/ACT inhaler Inhale 2 puffs into the lungs every 4 (four) hours as needed for wheezing or shortness of breath. 11/21/17  Yes Orlie Dakin, MD  albuterol (PROVENTIL) (2.5 MG/3ML) 0.083% nebulizer solution Take 3 mLs (2.5 mg total) by nebulization every 4 (four) hours as needed for wheezing or shortness of breath. 11/21/17  Yes Orlie Dakin, MD  aspirin EC 81 MG tablet Take 81 mg by mouth daily.   Yes [provider]  atorvastatin (LIPITOR) 80 MG tablet Take 1 tablet (80 mg total) by mouth daily. 11/10/17  Yes Burnell Blanks, MD  budesonide-formoterol [facility]) 160-4.5 MCG/ACT inhaler Inhale 2 puffs into the lungs 2 (two) times daily.   Yes [provider]  calcium acetate (PHOSLO) 667 MG capsule Take 667-1,334 mg by mouth 3 (three) times daily with meals.  01/09/16   Yes [provider]  cetirizine (ZYRTEC) 10 MG tablet Take 10 mg by mouth as needed for allergies.   Yes [provider]  clopidogrel (PLAVIX) 75 MG tablet Take 1 tablet (75 mg total) by mouth daily. 11/10/17  Yes Burnell Blanks, MD  dextromethorphan-guaiFENesin [facility] DM) 30-600 MG 12hr tablet Take 1 tablet by mouth 2 (two) times daily as needed for cough.   Yes [provider]  ethyl chloride spray Apply 1 application topically See admin instructions. Spray at dialysis on Mon/Wed/Fri as directed 05/28/17  Yes [provider]  guaiFENesin (MUCINEX) 600 MG 12 hr tablet Take 600 mg by mouth 2 (two) times daily as needed for to loosen phlegm.   Yes [provider]  HYDROcodone-homatropine (HYCODAN) 5-1.5 MG/5ML syrup Take 5 mLs by mouth every 4 (four) hours as needed for cough. 11/27/17  Yes Tat, Shanon Brow, MD  methocarbamol (ROBAXIN) 500 MG tablet Take 500 mg by mouth 2 (two) times daily as needed for muscle spasms.  Yes [provider]  metoprolol succinate (TOPROL-XL) 50 MG 24 hr tablet Take 50 mg by mouth 2 (two) times daily. Take with or immediately following a meal.    Yes [provider]  midodrine (PROAMATINE) 10 MG tablet Take 10 mg by mouth See admin instructions. Take 10 mg by mouth on Mon/Wed/Fri at dialysis as needed for low blood pressure   Yes [provider]  OXYGEN Inhale 1-2 L into the lungs daily as needed (for shortness of breath).    Yes [provider]  oxymetazoline (AFRIN) 0.05 % nasal spray Place 1 spray into both nostrils once as needed (to stop nose bleeds).    Yes [provider]  predniSONE (DELTASONE) 10 MG tablet Take 6 tablets (60 mg total) by mouth daily with breakfast. And decrease by one tablet daily 11/28/17  Yes Tat, David, MD  Glycopyrrolate-Formoterol (BEVESPI AEROSPHERE) 9-4.8 MCG/ACT AERO Inhale 2 puffs into the lungs 2 (two) times daily. Patient not taking: Reported on  11/29/2017 11/18/17   Tanda Rockers, MD  predniSONE (DELTASONE) 5 MG tablet Take 2 tablets (10 mg total) by mouth daily with breakfast. 11/21/17   Orlie Dakin, MD    Family History Family History  Problem Relation Age of Onset  . Cancer Mother        Breast  . Heart disease Father   . Heart attack Father   . Parkinson's disease Brother     Social History Social History   Tobacco Use  . Smoking status: Former Smoker    Packs/day: 1.00    Years: 49.00    Pack years: 49.00    Types: Cigarettes, Pipe, Cigars    Last attempt to quit: 02/21/2000    Years since quitting: 17.7  . Smokeless tobacco: Former Systems developer    Types: Chew  . Tobacco comment: "chewed when he was a kid"  Substance Use Topics  . Alcohol use: Yes    Comment: 03/15/2017 "nothing since the 1960's"  . Drug use: No     Allergies   Yellow dyes (non-tartrazine); Levaquin [levofloxacin in d5w]; and Levofloxacin   Review of Systems Review of Systems  Constitutional: Negative for appetite change, chills and fever.  HENT: Negative for ear pain, rhinorrhea, sneezing and sore throat.   Eyes: Negative for photophobia and visual disturbance.  Respiratory: Positive for cough, shortness of breath and wheezing. Negative for chest tightness.   Cardiovascular: Positive for leg swelling. Negative for chest pain and palpitations.  Gastrointestinal: Negative for abdominal pain, blood in stool, constipation, diarrhea, nausea and vomiting.  Genitourinary: Negative for dysuria, hematuria and urgency.  Musculoskeletal: Negative for myalgias.  Skin: Negative for rash.  Neurological: Negative for dizziness, weakness and light-headedness.     Physical Exam Updated Vital Signs BP 113/62 (BP Location: Left Arm)   Pulse (!) 102   Temp 98 F (36.7 C) (Oral)   Resp 16   Ht 5\' 8"  (1.727 m)   Wt 67.1 kg (148 lb)   SpO2 95%   BMI 22.50 kg/m   Physical Exam  Constitutional: He appears well-developed and well-nourished. No  distress.  HENT:  Head: Normocephalic and atraumatic.  Nose: Nose normal.  Eyes: Conjunctivae and EOM are normal. Left eye exhibits no discharge. No scleral icterus.  Neck: Normal range of motion. Neck supple.  Cardiovascular: Regular rhythm, normal heart sounds and intact distal pulses. Tachycardia present. Exam reveals no gallop and no friction rub.  No murmur heard. Pulmonary/Chest: Effort normal. No respiratory distress. He  has wheezes in the left middle field and the left lower field.  Abdominal: Soft. Bowel sounds are normal. He exhibits no distension. There is no tenderness. There is no guarding.  Musculoskeletal: Normal range of motion.       Right lower leg: He exhibits edema.       Left lower leg: He exhibits edema.  2+ pitting edema in bilateral lower extremities. Ecchymosis of L shin. Erythema of L second and third digits of foot.  Neurological: He is alert. He exhibits normal muscle tone. Coordination normal.  Skin: Skin is warm and dry. No rash noted.  Psychiatric: He has a normal mood and affect.  Nursing note and vitals reviewed.    ED Treatments / Results  Labs (all labs ordered are listed, but only abnormal results are displayed) Labs Reviewed  BASIC METABOLIC PANEL - Abnormal; Notable for the following components:      Result Value   Chloride 93 (*)    Glucose, Bld 310 (*)    BUN 40 (*)    Creatinine, Ser 3.61 (*)    Calcium 7.7 (*)    GFR calc non Af Amer 15 (*)    GFR calc Af Amer 17 (*)    All other components within normal limits  CBC WITH DIFFERENTIAL/PLATELET - Abnormal; Notable for the following components:   RBC 3.60 (*)    Hemoglobin 11.1 (*)    HCT 36.2 (*)    MCV 100.6 (*)    RDW 18.5 (*)    Neutro Abs 9.5 (*)    Lymphs Abs 0.1 (*)    All other components within normal limits  BRAIN NATRIURETIC PEPTIDE - Abnormal; Notable for the following components:   B Natriuretic Peptide 565.3 (*)    All other components within normal limits  I-STAT  VENOUS BLOOD GAS, ED - Abnormal; Notable for the following components:   pCO2, Ven 66.3 (*)    pO2, Ven 21.0 (*)    Bicarbonate 35.0 (*)    TCO2 37 (*)    Acid-Base Excess 6.0 (*)    All other components within normal limits  URINALYSIS, ROUTINE W REFLEX MICROSCOPIC  I-STAT TROPONIN, ED    EKG EKG Interpretation  Date/Time:  Monday November 29 2017 17:59:50 EDT Ventricular Rate:  102 PR Interval:    QRS Duration: 78 QT Interval:  340 QTC Calculation: 443 R Axis:   71 Text Interpretation:  Atrial fibrillation with rapid ventricular response with premature ventricular or aberrantly conducted complexes Nonspecific ST abnormality Abnormal QRS-T angle, consider primary T wave abnormality Abnormal ECG Confirmed by Julianne Rice [phone]) on 11/29/2017 7:25:52 PM   Radiology Dg Chest 2 View  Result Date: 11/29/2017 CLINICAL DATA:  Dyspnea x1 day. EXAM: CHEST - 2 VIEW COMPARISON:  11/22/2017 CXR, chest CT 07/16/2017 FINDINGS: Emphysematous hyperinflation of the lungs. Pulmonary opacities at the right lung base consistent with atelectasis and/or pneumonia. Follow-up to clearance recommended. Subsegmental atelectasis noted at the left lung base. Heart is within normal limits for size. There is aortic atherosclerosis. Rotator cuff calcific tendinopathy is seen bilaterally. IMPRESSION: Emphysematous lungs with right basilar opacity suspicious for pneumonia and/or atelectasis. Followup PA and lateral chest X-ray is recommended in 3-4 weeks following trial of antibiotic therapy to ensure resolution and exclude underlying malignancy. Electronically Signed   By: Ashley Royalty M.D.   On: 11/29/2017 19:00    Procedures Procedures (including critical care time)  Medications Ordered in ED Medications  vancomycin (VANCOCIN) 1,500 mg in sodium chloride 0.9 %  500 mL IVPB (1,500 mg Intravenous New Bag/Given 11/29/17 2032)  vancomycin (VANCOCIN) IVPB 750 mg/150 ml premix (has no administration in time range)    piperacillin-tazobactam (ZOSYN) IVPB 3.375 g (has no administration in time range)  ipratropium-albuterol (DUONEB) 0.5-2.5 (3) MG/3ML nebulizer solution 3 mL (3 mLs Nebulization Given 11/29/17 1946)     Initial Impression / Assessment and Plan / ED Course  I have reviewed the triage vital signs and the nursing notes.  Pertinent labs & imaging results that were available during my care of the patient were reviewed by me and considered in my medical decision making (see chart for details).     61-year-old male with a past medical history of ESRD on dialysis, hypertension, COPD currently on home oxygen therapy 2 to 3 L via nasal cannula, CHF presents for evaluation of shortness of breath and low oxygen saturations.  This happened during his dialysis session.  They did complete the session and he was able to pull off 4.5L of fluid.  He has had worsening of his shortness of breath.  On physical exam patient has wheezing and coarse breath sounds in the left lung fields.  He has 2-3+ pitting edema in bilateral lower extremities.  There is ecchymosis of the left lower extremity on the shin and toes.  He is afebrile.  He was discharged home on June 8 for COPD exacerbation on a prednisone taper and metoprolol.  Chest x-ray showed possible pneumonia.  This could be aspiration.  CBC with no leukocytosis.  BMP similar to baseline.  VBG shows low PO2.  Troponin negative.  BNP slightly elevated to 565 although this is significantly lower than his prior readings.  Patient will need to be admitted for his ongoing shortness of breath, oxygen demand and possible aspiration.  Began on vancomycin and Zosyn. Patient discussed with and seen by my attending, Dr. Lita Mains.  Portions of this note were generated with [employer]. Dictation errors may occur despite best attempts at proofreading.   Final Clinical Impressions(s) / ED Diagnoses   Final diagnoses:  None    ED Discharge Orders    None        Delia Heady, PA-C 11/29/17 2146    Julianne Rice, MD 12/02/17 1352

## 2017-11-29 NOTE — H&P (Addendum)
History and Physical    Terry [name]. BSW:[id number] DOB: 04-14-1935 DOA: 11/29/2017  Referring MD/NP/PA:   PCP: [name], DO   Patient coming from:  The patient is coming from home.  At baseline, pt is partially dependent for most of ADL.  Chief Complaint: SOB  HPI: Terry [name]. is a 83 y.o. male with medical history significant of COPD, chronic respiratory failure on 2-3 L nasal cannula oxygen as needed, hypertension, hyperlipidemia, stroke, atrial fibrillation not on anticoagulants due to GI bleeding, CAD, s/p of stent placement, CHF with EF 45%, ESHD-HD (MWF), aortic stenosis, who presents with shortness of breath.  Patient was recently hospitalized from 6/3-6/8 due to COPD exacerbation 2/2 parainfluenza. He is wearing 2-3 L of O2 prn at home, now requiring 3 L nasal cannula oxygen in ED. Pt states that he has worsensing SOB today. He has yellow and brownish sputum production. He denies chest pain, fever or chills.  Wife states that the patient is drowsy, but not confused.  He is oriented x3 currently.  Patient denies nausea, vomiting, diarrhea, abdominal pain.  He is constipated now. Wife states that pt has toe nail infection in left 2, 3 and 4th toes recently. The 2nd and 3rd let toe nail are missing. He was treated by foot doctor and has an appointment with for the doctor next month.  He denies any pain in the left foot. Pt had hemodialysis, and was found to have worsening shortness of breath in dialysis center., and sent to ED for further evaluation treatment.  ED Course: pt was found to have WBC 10.4, negative troponin, BNP 565, potassium 4.0, bicarbonate 31, creatinine 3.61, BUN 40, temperature normal, tachycardia, no tachypnea, oxygen saturation 95% on 3 L nasal cannula oxygen.  Chest x-ray showed possible right basilar infiltration.  Patient is admitted to telemetry bed as inpatient.  Review of Systems:  Low General: no fevers, chills, has poor appetite, has  fatigue HEENT: no blurry vision, hearing changes or sore throat Respiratory: has dyspnea, coughing, wheezing CV: no chest pain, no palpitations GI: no nausea, vomiting, abdominal pain, diarrhea, constipation GU: no dysuria, burning on urination, increased urinary frequency, hematuria  Ext: has leg edema Neuro: no unilateral weakness, numbness, or tingling, no vision change or hearing loss.  Has drowsiness. Skin: no rash, no skin tear. MSK: No muscle spasm, no deformity, no limitation of range of movement in spin Heme: No easy bruising.  Travel history: No recent long distant travel.  Allergy:  Allergies  Allergen Reactions  . Yellow Dyes (Non-Tartrazine) Other (See Comments)    Burns arms; Cough medications (tussionex and benzonatate ok)  . Levaquin [Levofloxacin In D5w] Itching  . Levofloxacin Nausea And Vomiting    Past Medical History:  Diagnosis Date  . Acute and chronic respiratory failure 10/22/2012  . Anxiety   . Arthritis    "hands, knees" (03/15/2017)  . Atrial fibrillation ([place])   . CAD (coronary artery disease) [place], [name]  . CHF (congestive heart failure) ([place]) 09/2012  . COPD (chronic obstructive pulmonary disease) ([place])   . ESRD (end stage renal disease) on dialysis [facility])    "MWF; [facility] [place]" (03/15/2017)  . Flu 09/24/2016   influenza b  . HCAP (healthcare-associated pneumonia) 03/15/2017  . High cholesterol   . History of blood transfusion 2012   "related to bowel resection"  . Hypertension   . On home oxygen therapy    "1L typically at night" (03/15/2017)  .  Osteoarthritis 10/22/2012  . Pneumonia April 2014; 09/2016  . Shortness of breath dyspnea   . Stroke [facility]) 2012   denies residual on 03/15/2017  . Stroke [facility]) ~ 2013   "small one in his right eye"    Past Surgical History:  Procedure Laterality Date  . APPENDECTOMY  1984  . AV FISTULA PLACEMENT Right 05/21/2015   Procedure: Right Arm Brachiocephalic ARTERIOVENOUS (AV)  FISTULA CREATION;  Surgeon: Angelia Mould, MD;  Location: [facility];  Service: Vascular;  Laterality: Right;  . CATARACT EXTRACTION W/ INTRAOCULAR LENS  IMPLANT, BILATERAL Bilateral   . COLECTOMY  2012   partial colon removed - twisted bowel  . CORONARY ANGIOPLASTY WITH STENT PLACEMENT  1999   2 stents  . CYSTOSCOPY/RETROGRADE/URETEROSCOPY Bilateral 04/23/2014   Procedure: CYSTOSCOPY BILATERAL RETROGRADE,  LEFT URETEROSCOPY, LEFT STENT PLACEMENT;  Surgeon: Raynelle Bring, MD;  Location: [facility];  Service: Urology;  Laterality: Bilateral;  . EYE SURGERY Left    "surgery to clean off lens after cataract OR"  . INGUINAL HERNIA REPAIR Right     Social History:  reports that he quit smoking about 17 years ago. His smoking use included cigarettes, pipe, and cigars. He has a 49.00 pack-year smoking history. He has quit using smokeless tobacco. His smokeless tobacco use included chew. He reports that he drinks alcohol. He reports that he does not use drugs.  Family History:  Family History  Problem Relation Age of Onset  . Cancer Mother        Breast  . Heart disease Father   . Heart attack Father   . Parkinson's disease Brother      Prior to Admission medications   Medication Sig Start Date End Date Taking? Authorizing Provider  acetaminophen (TYLENOL) 500 MG tablet Take 1,000 mg by mouth as needed for mild pain or headache.   Yes [provider]  albuterol (PROVENTIL HFA;VENTOLIN HFA) 108 (90 Base) MCG/ACT inhaler Inhale 2 puffs into the lungs every 4 (four) hours as needed for wheezing or shortness of breath. 11/21/17  Yes Orlie Dakin, MD  albuterol (PROVENTIL) (2.5 MG/3ML) 0.083% nebulizer solution Take 3 mLs (2.5 mg total) by nebulization every 4 (four) hours as needed for wheezing or shortness of breath. 11/21/17  Yes Orlie Dakin, MD  aspirin EC 81 MG tablet Take 81 mg by mouth daily.   Yes [provider]  atorvastatin (LIPITOR) 80 MG tablet Take 1 tablet (80 mg  total) by mouth daily. 11/10/17  Yes Burnell Blanks, MD  budesonide-formoterol [facility]) 160-4.5 MCG/ACT inhaler Inhale 2 puffs into the lungs 2 (two) times daily.   Yes [provider]  calcium acetate (PHOSLO) 667 MG capsule Take 667-1,334 mg by mouth 3 (three) times daily with meals.  01/09/16  Yes [provider]  cetirizine (ZYRTEC) 10 MG tablet Take 10 mg by mouth as needed for allergies.   Yes [provider]  clopidogrel (PLAVIX) 75 MG tablet Take 1 tablet (75 mg total) by mouth daily. 11/10/17  Yes Burnell Blanks, MD  dextromethorphan-guaiFENesin [facility] DM) 30-600 MG 12hr tablet Take 1 tablet by mouth 2 (two) times daily as needed for cough.   Yes [provider]  ethyl chloride spray Apply 1 application topically See admin instructions. Spray at dialysis on Mon/Wed/Fri as directed 05/28/17  Yes [provider]  guaiFENesin (MUCINEX) 600 MG 12 hr tablet Take 600 mg by mouth 2 (two) times daily as needed for to loosen phlegm.   Yes [provider]  HYDROcodone-homatropine (HYCODAN) 5-1.5 MG/5ML syrup Take 5 mLs by mouth every 4 (four) hours as needed for cough. 11/27/17  Yes Tat, Shanon Brow, MD  methocarbamol (ROBAXIN) 500 MG tablet Take 500 mg by mouth 2 (two) times daily as needed for muscle spasms.   Yes [provider]  metoprolol succinate (TOPROL-XL) 50 MG 24 hr tablet Take 50 mg by mouth 2 (two) times daily. Take with or immediately following a meal.    Yes [provider]  midodrine (PROAMATINE) 10 MG tablet Take 10 mg by mouth See admin instructions. Take 10 mg by mouth on Mon/Wed/Fri at dialysis as needed for low blood pressure   Yes [provider]  OXYGEN Inhale 1-2 L into the lungs daily as needed (for shortness of breath).    Yes [provider]  oxymetazoline (AFRIN) 0.05 % nasal spray Place 1 spray into both nostrils once as needed (to stop nose bleeds).    Yes [provider]  predniSONE (DELTASONE) 10 MG tablet Take 6 tablets (60 mg total) by mouth daily with breakfast. And decrease by one tablet daily 11/28/17  Yes Tat, David, MD  Glycopyrrolate-Formoterol (BEVESPI AEROSPHERE) 9-4.8 MCG/ACT AERO Inhale 2 puffs into the lungs 2 (two) times daily. Patient not taking: Reported on 11/29/2017 11/18/17   Tanda Rockers, MD  predniSONE (DELTASONE) 5 MG tablet Take 2 tablets (10 mg total) by mouth daily with breakfast. 11/21/17   Orlie Dakin, MD    Physical Exam: Vitals:   11/29/17 1803 11/29/17 1806 11/30/17 0000  BP: 113/62  (!) 142/67  Pulse: (!) 102  89  Resp: 16  18  Temp: 98 F (36.7 C)    TempSrc: Oral    SpO2: 95%  94%  Weight:  67.1 kg (148 lb)   Height:  5\' 8"  (1.727 m)    General: Not in acute distress HEENT:       Eyes: PERRL, EOMI, no scleral icterus.       ENT: No discharge from the ears and nose, no pharynx injection, no tonsillar enlargement.        Neck: No JVD, no bruit, no mass felt. Heme: No neck lymph node enlargement. Cardiac: U7/O5, RRR, 3/6 systolic murmurs, No gallops or rubs. Respiratory: decreased air movement bilaterally. Has mild wheezing on the left side GI: Soft, nondistended, nontender, no rebound pain, no organomegaly, BS present. GU: No hematuria Ext: 2+ pitting leg edema bilaterally. 1+DP/PT pulse bilaterally. Has functioning AV fistula in the right arm Musculoskeletal: No joint deformities, No joint redness or warmth, no limitation of ROM in spin. Skin: The 2nd and 3rd let toe nail are missing. The toe tip is with black scab in 2, 3 and 4th toes. Neuro: drowsy, but oriented X3, cranial nerves II-XII grossly intact, moves all extremities normally.  Psych: Patient is not psychotic, no suicidal or hemocidal ideation.  Labs on Admission: I have personally reviewed following labs and imaging studies  CBC: Recent Labs  Lab 11/23/17 0434 11/24/17 0336 11/26/17 0610 11/29/17 1910  WBC 9.9 14.3* 15.1* 10.4   NEUTROABS  --   --   --  9.5*  HGB 9.8* 9.5* 9.8* 11.1*  HCT 32.2* 31.4* 32.6* 36.2*  MCV 104.9* 104.3* 102.8* 100.6*  PLT 252 274 297 366   Basic Metabolic Panel: Recent Labs  Lab 11/23/17 0415 11/24/17 0336 11/26/17 0610 11/29/17 1910  NA 139 139 131* 138  K 3.7 4.5 4.6 4.0  CL 95* 92* 90* 93*  CO2 31 33* 28 31  GLUCOSE 217* 139* 162* 310*  BUN 37* 65* 79* 40*  CREATININE 4.02* 6.19* 5.76* 3.61*  CALCIUM 8.5* 8.8* 8.6* 7.7*  PHOS  --  4.3 5.4*  --    GFR: Estimated Creatinine Clearance: 15.5 mL/min (A) (by C-G formula based on SCr of 3.61 mg/dL (H)). Liver Function Tests: Recent Labs  Lab 11/23/17 0415 11/24/17 0336 11/26/17 0610  AST 25  --   --   ALT 17  --   --   ALKPHOS 54  --   --   BILITOT 0.8  --   --   PROT 7.0  --   --   ALBUMIN 3.6 3.5 3.3*   No results for input(s): LIPASE, AMYLASE in the last 168 hours. No results for input(s): AMMONIA in the last 168 hours. Coagulation Profile: No results for input(s): INR, PROTIME in the last 168 hours. Cardiac Enzymes: Recent Labs  Lab 11/23/17 0415 11/23/17 1006 11/23/17 1536  TROPONINI 0.04* 0.03* 0.03*   BNP (last 3 results) No results for input(s): PROBNP in the last 8760 hours. HbA1C: No results for input(s): HGBA1C in the last 72 hours. CBG: No results for input(s): GLUCAP in the last 168 hours. Lipid Profile: No results for input(s): CHOL, HDL, LDLCALC, TRIG, CHOLHDL, LDLDIRECT in the last 72 hours. Thyroid Function Tests: No results for input(s): TSH, T4TOTAL, FREET4, T3FREE, THYROIDAB in the last 72 hours. Anemia Panel: No results for input(s): VITAMINB12, FOLATE, FERRITIN, TIBC, IRON, RETICCTPCT in the last 72 hours. Urine analysis:    Component Value Date/Time   COLORURINE AMBER (A) 08/16/2015 0655   APPEARANCEUR HAZY (A) 08/16/2015 0655   LABSPEC 1.023 08/16/2015 0655   PHURINE 5.0 08/16/2015 0655   GLUCOSEU 100 (A) 08/16/2015 0655   HGBUR NEGATIVE 08/16/2015 0655   BILIRUBINUR  SMALL (A) 08/16/2015 0655   KETONESUR 15 (A) 08/16/2015 0655   PROTEINUR >300 (A) 08/16/2015 0655   UROBILINOGEN 0.2 04/16/2014 1135   NITRITE NEGATIVE 08/16/2015 0655   LEUKOCYTESUR SMALL (A) 08/16/2015 0655   Sepsis Labs: @LABRCNTIP (procalcitonin:4,lacticidven:4) ) Recent Results (from the past 240 hour(s))  MRSA PCR Screening     Status: Abnormal   Collection Time: 11/23/17  3:40 AM  Result Value Ref Range Status   MRSA by PCR POSITIVE (A) NEGATIVE Corrected    Comment: MISTY SMART 11/23/17 @ 0902 [id number]        The GeneXpert MRSA Assay (FDA approved for NASAL specimens only), is one component of a comprehensive MRSA colonization surveillance program. It is not intended to diagnose MRSA infection nor to guide or monitor treatment for MRSA infections. Performed at [facility], [address], [place], [place] [address] CORRECTED ON 06/04 AT 5573: PREVIOUSLY REPORTED AS POSITIVE        The GeneXpert MRSA Assay (FDA approved for NASAL specimens only), is one component of a comprehensive MRSA colonization surveillance program. It is not intended to diagnose MRSA infection  nor to guide or monitor treatment for MRSA infections. RESULT CALLED TO, READ BACK BY AND VERIFIED WITH: MISTY SMART 11/23/17 @ 0902N LSCHMIDT   Respiratory Panel by PCR     Status: Abnormal   Collection Time: 11/24/17 11:40 AM  Result Value Ref Range Status   Adenovirus NOT DETECTED NOT DETECTED Final   Coronavirus 229E NOT DETECTED NOT DETECTED Final   Coronavirus HKU1 NOT DETECTED NOT DETECTED Final   Coronavirus NL63 NOT DETECTED NOT DETECTED Final   Coronavirus OC43 NOT DETECTED NOT DETECTED Final  Metapneumovirus NOT DETECTED NOT DETECTED Final   Rhinovirus / Enterovirus NOT DETECTED NOT DETECTED Final   Influenza A NOT DETECTED NOT DETECTED Final   Influenza B NOT DETECTED NOT DETECTED Final   Parainfluenza Virus 1 NOT DETECTED NOT DETECTED Final   Parainfluenza Virus 2 NOT DETECTED NOT DETECTED Final    Parainfluenza Virus 3 DETECTED (A) NOT DETECTED Final   Parainfluenza Virus 4 NOT DETECTED NOT DETECTED Final   Respiratory Syncytial Virus NOT DETECTED NOT DETECTED Final   Bordetella pertussis NOT DETECTED NOT DETECTED Final   Chlamydophila pneumoniae NOT DETECTED NOT DETECTED Final   Mycoplasma pneumoniae NOT DETECTED NOT DETECTED Final     Radiological Exams on Admission: Dg Chest 2 View  Result Date: 11/29/2017 CLINICAL DATA:  Dyspnea x1 day. EXAM: CHEST - 2 VIEW COMPARISON:  11/22/2017 CXR, chest CT 07/16/2017 FINDINGS: Emphysematous hyperinflation of the lungs. Pulmonary opacities at the right lung base consistent with atelectasis and/or pneumonia. Follow-up to clearance recommended. Subsegmental atelectasis noted at the left lung base. Heart is within normal limits for size. There is aortic atherosclerosis. Rotator cuff calcific tendinopathy is seen bilaterally. IMPRESSION: Emphysematous lungs with right basilar opacity suspicious for pneumonia and/or atelectasis. Followup PA and lateral chest X-ray is recommended in 3-4 weeks following trial of antibiotic therapy to ensure resolution and exclude underlying malignancy. Electronically Signed   By: Ashley Royalty M.D.   On: 11/29/2017 19:00   Dg Foot Complete Left  Result Date: 11/29/2017 CLINICAL DATA:  Left foot and toe pain. EXAM: LEFT FOOT - COMPLETE 3+ VIEW COMPARISON:  Foot radiograph 09/28/2017 FINDINGS: No fracture or dislocation. Unchanged alignment from prior exam. Minimal osteoarthritis at the great toe metatarsal phalangeal joint and throughout the digits. No bony destruction, osseous erosions or periosteal reaction. Soft tissue edema most prominent about the dorsum of the foot. No radiopaque foreign body. There are vascular calcifications. IMPRESSION: Dorsal soft tissue edema. Mild osteoarthritis without acute osseous abnormality. Electronically Signed   By: Jeb Levering M.D.   On: 11/29/2017 23:42     EKG: Independently  reviewed.  Atrial fibrillation, QTc 443, PVC, nonspecific T wave change.   Assessment/Plan Principal Problem:   Acute on chronic respiratory failure with hypoxia (HCC) Active Problems:   Essential hypertension   CAD (coronary artery disease)   Chronic atrial fibrillation (HCC)   Acute on chronic combined systolic and diastolic CHF (congestive heart failure) (HCC)   Anemia of chronic disease   ESRD (end stage renal disease) on dialysis (HCC)   COPD exacerbation (HCC)   Drowsiness   Acute on chronic respiratory failure with hypoxia ([place]): Likely due to multifactorial etiology, including COPD exacerbation given decreased air movement and mild wheezing on auscultation,  CHF exacerbation and ESRD with fluid overload give 2+ leg edema bilaterally and incomplete recovery from recent para influenza virus infection. CXR showed possible right basilar infiltration, but the patient does not have leukocytosis or fever, clinically does not seem to have HCAP pneumonia, but aspiration cannot be completely ruled out since patient. Pt was given vanco and zosyn in ED, will d/c vanco. Will continue zosyn for possible aspiration.  -will admit to tele bed as inpt -Nebulizers: scheduled Atrovent and prn Xopenex Nebs -Dulera inhaler  -Solu-Medrol 60 mg IV bid -zosyn iV -Mucinex for cough  -Incentive spirometry -Urine S. pneumococcal antigen -Follow up blood culture x2, sputum culture -Nasal cannula oxygen as needed to maintain O2 saturation 92% or greater -f/u SLP  ESRD (end stage renal disease) on dialysis (MWF)  with fluid over load: pt had HD today, but still had fluid overload. Potassium 4.0, bicarbonate 31, creatinine 3.61, BUN 40, -please call renal in AM for possible extra HD  Acute on chronic combined systolic and diastolic CHF: -volume magement by renal via HD HTN:  -Continue home medications: Metoprolol, -IV hydralazine prn  CAD (coronary artery disease): s/p of DES stent. No CP -continue  ASA, Plavix, Lipitor,  Atrial Fibrillation: CHA2DS2-VASc Score is 5, needs oral anticoagulation, but patient is not on AC at home due to hx of GIB.  Heart rate is 100s. -continue Metoprolol  Anemia of chronic disease stable,  Hemoglobin 11.1 -Follow-up with CBC  Drowsiness: Patient is a drowsiness, but oriented x3. No focal neurologic findings.  Likely due to multifactorial etiology, including hypoxia. -Frequent neuro check  Left toe issues: Wife states that pt has toe nail infection in left 2, 3 and 4th toes recently. The 2nd and 3rd let toe nail are missing. He was treated by foot doctor and has an appointment with for the doctor next month. No draining. No fever or leukocytosis. -Wound care consult -Follow-up with foot doctor. -f/u X-ray of left foot which is ordered by EDP   DVT ppx: SQ Heparin      Code Status: Full code Family Communication:    Yes, patient's  wife  at bed side Disposition Plan:  Anticipate discharge back to previous home environment Consults called:  none Admission status: Inpatient/tele      Date of Service 11/30/2017    Ivor Costa [facility] Pager [phone]  If 7PM-7AM, please contact night-coverage [email or web address] Password [email or web address] 11/30/2017, 12:12 AM

## 2017-11-29 NOTE — Progress Notes (Signed)
Pharmacy Antibiotic Note  Terry [name]. is a 83 y.o. male admitted on 11/29/2017 with worsening SOB.  Pharmacy has been consulted for vancomycin and Zosyn dosing for PNA.  Patient has ESRD on MWF HD and he completed his session today.  Afebrile.   Plan: Vanc 1500mg  IV x 1, then 750mg  qHD MWF Zosyn EID 3.375gm IV Q12H Monitor HD schedule/tolerance, clinical progress, vanc level as indicated   Height: 5\' 8"  (172.7 cm) Weight: 148 lb (67.1 kg) IBW/kg (Calculated) : 68.4  Temp (24hrs), Avg:98 F (36.7 C), Min:98 F (36.7 C), Max:98 F (36.7 C)  Recent Labs  Lab 11/22/17 2145 11/23/17 0415 11/23/17 0434 11/24/17 0336 11/26/17 0610 11/29/17 1910  WBC 9.0  --  9.9 14.3* 15.1* 10.4  CREATININE 3.46* 4.02*  --  6.19* 5.76*  --     Estimated Creatinine Clearance: 9.7 mL/min (A) (by C-G formula based on SCr of 5.76 mg/dL (H)).    Allergies  Allergen Reactions  . Yellow Dyes (Non-Tartrazine) Other (See Comments)    Burns arms; Cough medications (tussionex and benzonatate ok)  . Levaquin [Levofloxacin In D5w] Itching  . Levofloxacin Nausea And Vomiting    Akyla Vavrek D. Mina Marble, PharmD, BCPS, BCCCP Pager:  [phone] 11/29/2017, 7:46 PM

## 2017-11-29 NOTE — ED Triage Notes (Signed)
Pt arrived with wife with c/o worsening SOB O2 on 3 L [place] 87%. Wife reports up to two weeks ago he did not wear any oxygen other than 1 L [place] to sleep.

## 2017-11-30 ENCOUNTER — Ambulatory Visit: Payer: Medicare PPO | Admitting: Podiatry

## 2017-11-30 DIAGNOSIS — J9620 Acute and chronic respiratory failure, unspecified whether with hypoxia or hypercapnia: Secondary | ICD-10-CM

## 2017-11-30 LAB — BASIC METABOLIC PANEL
ANION GAP: 15 (ref 5–15)
BUN: 77 mg/dL — ABNORMAL HIGH (ref 6–20)
CO2: 29 mmol/L (ref 22–32)
Calcium: 8 mg/dL — ABNORMAL LOW (ref 8.9–10.3)
Chloride: 93 mmol/L — ABNORMAL LOW (ref 101–111)
Creatinine, Ser: 5.67 mg/dL — ABNORMAL HIGH (ref 0.61–1.24)
GFR, EST AFRICAN AMERICAN: 10 mL/min — AB (ref >60)
GFR, EST NON AFRICAN AMERICAN: 8 mL/min — AB (ref >60)
Glucose, Bld: 117 mg/dL — ABNORMAL HIGH (ref 65–99)
POTASSIUM: 5.9 mmol/L — AB (ref 3.5–5.1)
SODIUM: 137 mmol/L (ref 135–145)

## 2017-11-30 MED ORDER — ONDANSETRON HCL 4 MG/2ML IJ SOLN
4.0000 mg | Freq: Three times a day (TID) | INTRAMUSCULAR | Status: DC | PRN
Start: 2017-11-30 — End: 2017-12-04
  Administered 2017-11-30: 4 mg via INTRAVENOUS
  Filled 2017-11-30: qty 2

## 2017-11-30 MED ORDER — IPRATROPIUM-ALBUTEROL 0.5-2.5 (3) MG/3ML IN SOLN: 3.0000 mL | RESPIRATORY_TRACT | Status: DC | PRN

## 2017-11-30 MED ORDER — HYDRALAZINE HCL 20 MG/ML IJ SOLN: 5.0000 mg | INTRAMUSCULAR | Status: DC | PRN

## 2017-11-30 MED ORDER — METHYLPREDNISOLONE SODIUM SUCC 125 MG IJ SOLR
60.0000 mg | Freq: Two times a day (BID) | INTRAMUSCULAR | Status: DC
  Administered 2017-11-30 – 2017-12-01 (×4): 60 mg via INTRAVENOUS
  Filled 2017-11-30 (×5): qty 2

## 2017-11-30 NOTE — Progress Notes (Signed)
PROGRESS NOTE    Terry [name].  CHY:[id number] DOB: 1934/12/14 DOA: 11/29/2017 PCP: [name], DO    Brief Narrative:  83 y.o. male with medical history significant of COPD, chronic respiratory failure on 2-3 L nasal cannula oxygen as needed, hypertension, hyperlipidemia, stroke, atrial fibrillation not on anticoagulants due to GI bleeding, CAD, s/p of stent placement, CHF with EF 45%, ESHD-HD (MWF), aortic stenosis, who presents with shortness of breath.  Patient was recently hospitalized from 6/3-6/8 due to COPD exacerbation 2/2 parainfluenza. He is wearing 2-3 L of O2 prn at home, now requiring 3 L nasal cannula oxygen in ED. Pt states that he has worsensing SOB today. He has yellow and brownish sputum production. He denies chest pain, fever or chills.  Wife states that the patient is drowsy, but not confused.  He is oriented x3 currently.  Patient denies nausea, vomiting, diarrhea, abdominal pain.  He is constipated now. Wife states that pt has toe nail infection in left 2, 3 and 4th toes recently. The 2nd and 3rd let toe nail are missing. He was treated by foot doctor and has an appointment with for the doctor next month.  He denies any pain in the left foot. Pt had hemodialysis, and was found to have worsening shortness of breath in dialysis center., and sent to ED for further evaluation treatment.  ED Course: pt was found to have WBC 10.4, negative troponin, BNP 565, potassium 4.0, bicarbonate 31, creatinine 3.61, BUN 40, temperature normal, tachycardia, no tachypnea, oxygen saturation 95% on 3 L nasal cannula oxygen.  Chest x-ray showed possible right basilar infiltration.  Patient is admitted to telemetry bed as inpatient.  Assessment & Plan:   Principal Problem:   Acute on chronic respiratory failure with hypoxia (HCC) Active Problems:   Essential hypertension   CAD (coronary artery disease)   Chronic atrial fibrillation (HCC)   Acute on chronic combined systolic and  diastolic CHF (congestive heart failure) (HCC)   Anemia of chronic disease   ESRD (end stage renal disease) on dialysis (HCC)   COPD exacerbation (HCC)   Drowsiness  Acute on chronic respiratory failure with hypoxia ([place]):  -Possible multifocal with combination of underlying COPD versus volume overload in setting of end-stage renal disease, concerns for aspiration. -Patient is continued on scheduled IV steroids and bronchodilators -Continue patient on empiric Zosyn as tolerated -Continue to wean O2 as tolerated  ESRD (end stage renal disease) on dialysis (MWF) with fluid over load:  -Have consulted nephrology -Patient is continued on Monday Wednesday Friday hemodialysis.  Acute on chronic combined systolic and diastolic CHF: -volume magement by renal via HD -Patient lower extremity edema noted on exam.  HTN:  -Continue home medications as tolerated: Metoprolol, -continue IV hydralazine prn  CAD (coronary artery disease):  -s/p of DES stent. No CP -will continue ASA, Plavix, Lipitor as tolerated  Atrial Fibrillation: CHA2DS2-VASc Score is 5, needs oral anticoagulation, but patient is not on AC at home due to hx of GIB.  Heart rate is 100s. -continue with Metoprolol  Anemia of chronic disease stable,  Hemoglobin 11.1 at time of presentation -Hemodynamically stable -Repeat CBC in AM  Drowsiness: Patient is a drowsiness, but oriented x3. No focal neurologic findings.  Likely due to multifactorial etiology, including hypoxia. -Frequent neuro check  Left toe issues:  -Followed by Podiatry as outpatient -Wound care consulted -Follow-up with foot doctor. -L foot xray reviewed. Mild arthritis noted  DVT prophylaxis: Heparin subQ Code Status: Full Family Communication:  Pt in room, family at bedside Disposition Plan: Uncertain at this time  Consultants:   Nephrology  Procedures:     Antimicrobials: Anti-infectives (From admission, onward)   Start     Dose/Rate  Route Frequency Ordered Stop   12/01/17 1200  vancomycin (VANCOCIN) IVPB 750 mg/150 ml premix  Status:  Discontinued     750 mg 150 mL/hr over 60 Minutes Intravenous Every M-W-F (Hemodialysis) 11/29/17 1948 11/29/17 2205   11/29/17 2100  piperacillin-tazobactam (ZOSYN) IVPB 3.375 g     3.375 g 12.5 mL/hr over 240 Minutes Intravenous Every 12 hours 11/29/17 1948     11/29/17 1945  vancomycin (VANCOCIN) 1,500 mg in sodium chloride 0.9 % 500 mL IVPB     1,500 mg 250 mL/hr over 120 Minutes Intravenous  Once 11/29/17 1948 11/29/17 2340       Subjective: Denies sob at this time  Objective: Vitals:   11/30/17 0751 11/30/17 0819 11/30/17 1158 11/30/17 1439  BP:  (!) 123/50 98/66   Pulse: 81 (!) 101 62 74  Resp: 20 20 18 20   Temp:  (!) 97.5 F (36.4 C) 98.6 F (37 C)   TempSrc:  Oral Oral   SpO2: 93% 94% 93% 94%  Weight:      Height:        Intake/Output Summary (Last 24 hours) at 11/30/2017 1552 Last data filed at 11/30/2017 0327 Gross per 24 hour  Intake 50 ml  Output -  Net 50 ml   Filed Weights   11/29/17 1806  Weight: 67.1 kg (148 lb)    Examination: General exam: Appears calm and comfortable  Respiratory system: Clear to auscultation. Respiratory effort normal. Cardiovascular system: S1 & S2 heard, RRR Gastrointestinal system: Abdomen is nondistended, soft and nontender. No organomegaly or masses felt. Normal bowel sounds heard. Central nervous system: Alert and oriented. No focal neurological deficits. Extremities: Symmetric 5 x 5 power. Skin: No rashes, lesions Psychiatry: Judgement and insight appear normal. Mood & affect appropriate.   Data Reviewed: I have personally reviewed following labs and imaging studies  CBC: Recent Labs  Lab 11/24/17 0336 11/26/17 0610 11/29/17 1910  WBC 14.3* 15.1* 10.4  NEUTROABS  --   --  9.5*  HGB 9.5* 9.8* 11.1*  HCT 31.4* 32.6* 36.2*  MCV 104.3* 102.8* 100.6*  PLT 274 297 818   Basic Metabolic Panel: Recent Labs    Lab 11/24/17 0336 11/26/17 0610 11/29/17 1910  NA 139 131* 138  K 4.5 4.6 4.0  CL 92* 90* 93*  CO2 33* 28 31  GLUCOSE 139* 162* 310*  BUN 65* 79* 40*  CREATININE 6.19* 5.76* 3.61*  CALCIUM 8.8* 8.6* 7.7*  PHOS 4.3 5.4*  --    GFR: Estimated Creatinine Clearance: 15.5 mL/min (A) (by C-G formula based on SCr of 3.61 mg/dL (H)). Liver Function Tests: Recent Labs  Lab 11/24/17 0336 11/26/17 0610  ALBUMIN 3.5 3.3*   No results for input(s): LIPASE, AMYLASE in the last 168 hours. No results for input(s): AMMONIA in the last 168 hours. Coagulation Profile: No results for input(s): INR, PROTIME in the last 168 hours. Cardiac Enzymes: No results for input(s): CKTOTAL, CKMB, CKMBINDEX, TROPONINI in the last 168 hours. BNP (last 3 results) No results for input(s): PROBNP in the last 8760 hours. HbA1C: No results for input(s): HGBA1C in the last 72 hours. CBG: No results for input(s): GLUCAP in the last 168 hours. Lipid Profile: No results for input(s): CHOL, HDL, LDLCALC, TRIG, CHOLHDL, LDLDIRECT in the last  72 hours. Thyroid Function Tests: No results for input(s): TSH, T4TOTAL, FREET4, T3FREE, THYROIDAB in the last 72 hours. Anemia Panel: No results for input(s): VITAMINB12, FOLATE, FERRITIN, TIBC, IRON, RETICCTPCT in the last 72 hours. Sepsis Labs: No results for input(s): PROCALCITON, LATICACIDVEN in the last 168 hours.  Recent Results (from the past 240 hour(s))  MRSA PCR Screening     Status: Abnormal   Collection Time: 11/23/17  3:40 AM  Result Value Ref Range Status   MRSA by PCR POSITIVE (A) NEGATIVE Corrected    Comment: MISTY SMART 11/23/17 @ 71 [id number]        The GeneXpert MRSA Assay (FDA approved for NASAL specimens only), is one component of a comprehensive MRSA colonization surveillance program. It is not intended to diagnose MRSA infection nor to guide or monitor treatment for MRSA infections. Performed at [facility], [address],  [place], [place] [address] CORRECTED ON 06/04 AT 1761: PREVIOUSLY REPORTED AS POSITIVE        The GeneXpert MRSA Assay (FDA approved for NASAL specimens only), is one component of a comprehensive MRSA colonization surveillance program. It is not intended to diagnose MRSA infection  nor to guide or monitor treatment for MRSA infections. RESULT CALLED TO, READ BACK BY AND VERIFIED WITH: MISTY SMART 11/23/17 @ 0902N [id number]   Respiratory Panel by PCR     Status: Abnormal   Collection Time: 11/24/17 11:40 AM  Result Value Ref Range Status   Adenovirus NOT DETECTED NOT DETECTED Final   Coronavirus 229E NOT DETECTED NOT DETECTED Final   Coronavirus HKU1 NOT DETECTED NOT DETECTED Final   Coronavirus NL63 NOT DETECTED NOT DETECTED Final   Coronavirus OC43 NOT DETECTED NOT DETECTED Final   Metapneumovirus NOT DETECTED NOT DETECTED Final   Rhinovirus / Enterovirus NOT DETECTED NOT DETECTED Final   Influenza A NOT DETECTED NOT DETECTED Final   Influenza B NOT DETECTED NOT DETECTED Final   Parainfluenza Virus 1 NOT DETECTED NOT DETECTED Final   Parainfluenza Virus 2 NOT DETECTED NOT DETECTED Final   Parainfluenza Virus 3 DETECTED (A) NOT DETECTED Final   Parainfluenza Virus 4 NOT DETECTED NOT DETECTED Final   Respiratory Syncytial Virus NOT DETECTED NOT DETECTED Final   Bordetella pertussis NOT DETECTED NOT DETECTED Final   Chlamydophila pneumoniae NOT DETECTED NOT DETECTED Final   Mycoplasma pneumoniae NOT DETECTED NOT DETECTED Final     Radiology Studies: Dg Chest 2 View  Result Date: 11/29/2017 CLINICAL DATA:  Dyspnea x1 day. EXAM: CHEST - 2 VIEW COMPARISON:  11/22/2017 CXR, chest CT 07/16/2017 FINDINGS: Emphysematous hyperinflation of the lungs. Pulmonary opacities at the right lung base consistent with atelectasis and/or pneumonia. Follow-up to clearance recommended. Subsegmental atelectasis noted at the left lung base. Heart is within normal limits for size. There is aortic atherosclerosis. Rotator  cuff calcific tendinopathy is seen bilaterally. IMPRESSION: Emphysematous lungs with right basilar opacity suspicious for pneumonia and/or atelectasis. Followup PA and lateral chest X-ray is recommended in 3-4 weeks following trial of antibiotic therapy to ensure resolution and exclude underlying malignancy. Electronically Signed   By: Ashley Royalty M.D.   On: 11/29/2017 19:00   Dg Foot Complete Left  Result Date: 11/29/2017 CLINICAL DATA:  Left foot and toe pain. EXAM: LEFT FOOT - COMPLETE 3+ VIEW COMPARISON:  Foot radiograph 09/28/2017 FINDINGS: No fracture or dislocation. Unchanged alignment from prior exam. Minimal osteoarthritis at the great toe metatarsal phalangeal joint and throughout the digits. No bony destruction, osseous erosions or periosteal reaction. Soft tissue  edema most prominent about the dorsum of the foot. No radiopaque foreign body. There are vascular calcifications. IMPRESSION: Dorsal soft tissue edema. Mild osteoarthritis without acute osseous abnormality. Electronically Signed   By: Jeb Levering M.D.   On: 11/29/2017 23:42    Scheduled Meds: . aspirin EC  81 mg Oral Daily  . atorvastatin  80 mg Oral Daily  . calcium acetate  667-1,334 mg Oral TID WC  . clopidogrel  75 mg Oral Daily  . heparin  5,000 Units Subcutaneous Q8H  . ipratropium  0.5 mg Nebulization Q6H  . levalbuterol  1.25 mg Nebulization Q6H  . loratadine  10 mg Oral Daily  . methylPREDNISolone (SOLU-MEDROL) injection  60 mg Intravenous Q12H  . metoprolol succinate  50 mg Oral BID  . mometasone-formoterol  2 puff Inhalation BID   Continuous Infusions: . piperacillin-tazobactam (ZOSYN)  IV Stopped (11/30/17 1354)     LOS: 1 day   Marylu Lund, MD [facility] Pager [phone]  If 7PM-7AM, please contact night-coverage [email or web address] Password [email or web address] 11/30/2017, 3:52 PM

## 2017-11-30 NOTE — Progress Notes (Signed)
Inpatient Diabetes Program Recommendations  AACE/ADA: New Consensus Statement on Inpatient Glycemic Control (2015)  Target Ranges:  Prepandial:   less than 140 mg/dL      [place] postprandial:   less than 180 mg/dL (1-2 hours)      Critically ill patients:  140 - 180 mg/dL   Results for Terry [name], Terry [name] (MRN [id number]) as of 11/30/2017 12:25  Ref. Range 11/29/2017 19:10  Glucose Latest Ref Range: 65 - 99 mg/dL 310 (H)     Admit with: SOB  NO History of DM noted.  Current Insulin Orders: None     Note patient started on Solumedrol 60 mg BID at 3am today.  Lab glucose elevated to 310 mg/dl last PM.    MD- Please consider placing orders for Novolog Sensitive Correction Scale/ SSI (0-9 units) TID AC + HS      --Will follow patient during hospitalization--  Wyn Quaker RN, MSN, CDE Diabetes Coordinator Inpatient Glycemic Control Team Team Pager: [phone] (8a-5p)

## 2017-11-30 NOTE — Plan of Care (Signed)
  Problem: Education: Goal: Knowledge of General Education information will improve Outcome: Progressing  Pt and wife educated on POC. Pt was very drowsy. RN updated wife on pts plan for the rest of this shift. Wife given opportunity to ask questions, all questions answered, pt resting comfortably.

## 2017-11-30 NOTE — Evaluation (Signed)
Clinical/Bedside Swallow Evaluation Patient Details  Name: Terry [name]. MRN: [id number] Date of Birth: 22-Dec-1934  Today's Date: 11/30/2017 Time: SLP Start Time (ACUTE ONLY): 1315 SLP Stop Time (ACUTE ONLY): 1339 SLP Time Calculation (min) (ACUTE ONLY): 24 min  Past Medical History:  Past Medical History:  Diagnosis Date  . Acute and chronic respiratory failure 10/22/2012  . Anxiety   . Arthritis    "hands, knees" (03/15/2017)  . Atrial fibrillation ([place])   . CAD (coronary artery disease) [place], [name]  . CHF (congestive heart failure) ([place]) 09/2012  . COPD (chronic obstructive pulmonary disease) ([place])   . ESRD (end stage renal disease) on dialysis [facility])    "MWF; [facility], [place]" (03/15/2017)  . Flu 09/24/2016   influenza b  . HCAP (healthcare-associated pneumonia) 03/15/2017  . High cholesterol   . History of blood transfusion 2012   "related to bowel resection"  . Hypertension   . On home oxygen therapy    "1L typically at night" (03/15/2017)  . Osteoarthritis 10/22/2012  . Pneumonia April 2014; 09/2016  . Shortness of breath dyspnea   . Stroke [facility]) 2012   denies residual on 03/15/2017  . Stroke [facility]) ~ 2013   "small one in his right eye"   Past Surgical History:  Past Surgical History:  Procedure Laterality Date  . APPENDECTOMY  1984  . AV FISTULA PLACEMENT Right 05/21/2015   Procedure: Right Arm Brachiocephalic ARTERIOVENOUS (AV) FISTULA CREATION;  Surgeon: Angelia Mould, MD;  Location: [place];  Service: Vascular;  Laterality: Right;  . CATARACT EXTRACTION W/ INTRAOCULAR LENS  IMPLANT, BILATERAL Bilateral   . COLECTOMY  2012   partial colon removed - twisted bowel  . CORONARY ANGIOPLASTY WITH STENT PLACEMENT  1999   2 stents  . CYSTOSCOPY/RETROGRADE/URETEROSCOPY Bilateral 04/23/2014   Procedure: CYSTOSCOPY BILATERAL RETROGRADE,  LEFT URETEROSCOPY, LEFT STENT PLACEMENT;  Surgeon: Raynelle Bring, MD;  Location: [facility];  Service: Urology;   Laterality: Bilateral;  . EYE SURGERY Left    "surgery to clean off lens after cataract OR"  . INGUINAL HERNIA REPAIR Right    HPI:  Pt  is an 83 y.o. male with medical history significant of COPD, chronic respiratory failure on 2-3 L nasal cannula oxygen as needed, hypertension, hyperlipidemia, stroke, atrial fibrillation not on anticoagulants due to GI bleeding, CAD, s/p of stent placement, CHF with EF 45%, ESHD-HD (MWF), aortic stenosis, who presents with shortness of breath. Per MD note concern is for COPD exacerbation. CXR concerning for RLL infiltrate but he does not have leukocytosis or fever; possible aspiration still a concern. Pt has been seen by SLP in the past after multiple intubations. MBS December 2016 recommended Dys 1 diet and nectar thick liquids. He advanced to Dys 3 diet and thin liquids prior to discharge.   Assessment / Plan / Recommendation Clinical Impression  Pt has no overt s/s of aspiration and denies any subjective difficulty swallowing. His mastication is prolonged, which he attributes to his dentures. With extra time and Mod I he consumed a BBQ sandwich for lunch. Discussed CXR results and the relationship between respiration/swallowing. Encouraged rest breaks PRN for respiratory status. SLP also offered MBS as an option to assess for silent aspiration, particularly since pt would not take big gulps to attempt the 3 oz water screen. At this time pt and his wife are not interested. SLP to sign off at this time. Should pt change his mind about participating in swallow study, please  reorder.  SLP Visit Diagnosis: Dysphagia, unspecified (R13.10)    Aspiration Risk  Mild aspiration risk    Diet Recommendation Regular;Thin liquid   Liquid Administration via: Cup;Straw Medication Administration: Whole meds with liquid Supervision: Patient able to self feed;Intermittent supervision to cue for compensatory strategies Compensations: Slow rate;Small sips/bites Postural  Changes: Seated upright at 90 degrees    Other  Recommendations Oral Care Recommendations: Oral care BID   Follow up Recommendations None      Frequency and Duration            Prognosis        Swallow Study   General HPI: Pt  is an 83 y.o. male with medical history significant of COPD, chronic respiratory failure on 2-3 L nasal cannula oxygen as needed, hypertension, hyperlipidemia, stroke, atrial fibrillation not on anticoagulants due to GI bleeding, CAD, s/p of stent placement, CHF with EF 45%, ESHD-HD (MWF), aortic stenosis, who presents with shortness of breath. Per MD note concern is for COPD exacerbation. CXR concerning for RLL infiltrate but he does not have leukocytosis or fever; possible aspiration still a concern. Pt has been seen by SLP in the past after multiple intubations. MBS December 2016 recommended Dys 1 diet and nectar thick liquids. He advanced to Dys 3 diet and thin liquids prior to discharge. Type of Study: Bedside Swallow Evaluation Previous Swallow Assessment: see HPI Diet Prior to this Study: Regular;Thin liquids Temperature Spikes Noted: No Respiratory Status: Nasal cannula History of Recent Intubation: No Behavior/Cognition: Alert;Cooperative Oral Cavity Assessment: Within Functional Limits Oral Care Completed by SLP: No Oral Cavity - Dentition: Dentures, top;Dentures, bottom Vision: Functional for self-feeding Self-Feeding Abilities: Able to feed self Patient Positioning: Upright in bed Baseline Vocal Quality: Normal Volitional Cough: Strong Volitional Swallow: Able to elicit    Oral/Motor/Sensory Function Overall Oral Motor/Sensory Function: Within functional limits   Ice Chips Ice chips: Not tested   Thin Liquid Thin Liquid: Within functional limits Presentation: Cup;Self Fed    Nectar Thick Nectar Thick Liquid: Not tested   Honey Thick Honey Thick Liquid: Not tested   Puree Puree: Within functional limits Presentation: Self Fed;Spoon    Solid   GO   Solid: Within functional limits(given dentition) Presentation: Self [name] 11/30/2017,2:14 PM  Germain Osgood, M.A. CCC-SLP [phone]

## 2017-11-30 NOTE — Consult Note (Signed)
[place] Nurse wound consult note Reason for Consult: two toes on left foot with missing nails on 3rd and 4th digits. Patient's wife states they were removed by podiatrist due to fungal overgrowth.  Patient has been seen by Dr. Maryelizabeth Rowan at the [facility] [place].  Patient's wife states they have not been abel to get back for follow up appointments.. Wound type: infectious, surgical Pressure Injury POA: NA Measurement:N/A Wound bed:N/A Drainage (amount, consistency, odor) none Periwound:mild erythema, no warmth, edema Dressing procedure/placement/frequency: I have provided Nursing with guidance for topical care of the affected toenails applying a betadine swabstick twice daily after washing the feet with soap and water and drying thoroughly, particularly between the interdigital spaces and providing clean socks daily. Patient and wife are encouraged to contact Dr. Burnell Blanks office and to keep follow up appointments post discharge. They indicate understanding.  [place] nursing team will not follow, but will remain available to this patient, the nursing and medical teams.  Please re-consult if needed. Thanks, Maudie Flakes, MSN, RN, [place], Arther Abbott  Pager# [phone]

## 2017-12-01 DIAGNOSIS — I10 Essential (primary) hypertension: Secondary | ICD-10-CM

## 2017-12-01 LAB — RENAL FUNCTION PANEL
Albumin: 2.6 g/dL — ABNORMAL LOW (ref 3.5–5.0)
Anion gap: 19 — ABNORMAL HIGH (ref 5–15)
BUN: 100 mg/dL — ABNORMAL HIGH (ref 6–20)
CALCIUM: 8.1 mg/dL — AB (ref 8.9–10.3)
CO2: 26 mmol/L (ref 22–32)
Chloride: 92 mmol/L — ABNORMAL LOW (ref 101–111)
Creatinine, Ser: 6.89 mg/dL — ABNORMAL HIGH (ref 0.61–1.24)
GFR calc non Af Amer: 7 mL/min — ABNORMAL LOW (ref >60)
GFR, EST AFRICAN AMERICAN: 8 mL/min — AB (ref >60)
Glucose, Bld: 208 mg/dL — ABNORMAL HIGH (ref 65–99)
PHOSPHORUS: 8.1 mg/dL — AB (ref 2.5–4.6)
POTASSIUM: 5.7 mmol/L — AB (ref 3.5–5.1)
SODIUM: 137 mmol/L (ref 135–145)

## 2017-12-01 LAB — EXPECTORATED SPUTUM ASSESSMENT W REFEX TO RESP CULTURE

## 2017-12-01 LAB — EXPECTORATED SPUTUM ASSESSMENT W GRAM STAIN, RFLX TO RESP C

## 2017-12-01 LAB — CBC
HEMATOCRIT: 33.9 % — AB (ref 39.0–52.0)
Hemoglobin: 10.3 g/dL — ABNORMAL LOW (ref 13.0–17.0)
MCH: 30.5 pg (ref 26.0–34.0)
MCHC: 30.4 g/dL (ref 30.0–36.0)
MCV: 100.3 fL — ABNORMAL HIGH (ref 78.0–100.0)
Platelets: 247 10*3/uL (ref 150–400)
RBC: 3.38 MIL/uL — AB (ref 4.22–5.81)
RDW: 18.4 % — ABNORMAL HIGH (ref 11.5–15.5)
WBC: 15.9 10*3/uL — ABNORMAL HIGH (ref 4.0–10.5)

## 2017-12-01 MED ORDER — DARBEPOETIN ALFA 150 MCG/0.3ML IJ SOSY
150.0000 ug | PREFILLED_SYRINGE | INTRAMUSCULAR | Status: DC
  Administered 2017-12-01: 150 ug via INTRAVENOUS
  Filled 2017-12-01: qty 0.3

## 2017-12-01 MED ORDER — METHYLPREDNISOLONE SODIUM SUCC 125 MG IJ SOLR
60.0000 mg | Freq: Four times a day (QID) | INTRAMUSCULAR | Status: DC
  Administered 2017-12-01 – 2017-12-04 (×11): 60 mg via INTRAVENOUS
  Filled 2017-12-01 (×11): qty 2

## 2017-12-01 MED ORDER — LIDOCAINE-PRILOCAINE 2.5-2.5 % EX CREA: 1.0000 "application " | TOPICAL_CREAM | CUTANEOUS | Status: DC | PRN

## 2017-12-01 MED ORDER — SODIUM CHLORIDE 0.9 % IV SOLN: 100.0000 mL | INTRAVENOUS | Status: DC | PRN

## 2017-12-01 MED ORDER — PENTAFLUOROPROP-TETRAFLUOROETH EX AERO: 1.0000 "application " | INHALATION_SPRAY | CUTANEOUS | Status: DC | PRN

## 2017-12-01 MED ORDER — SODIUM CHLORIDE 0.9 % IV SOLN
62.5000 mg | INTRAVENOUS | Status: DC
  Administered 2017-12-01: 62.5 mg via INTRAVENOUS
  Filled 2017-12-01: qty 5

## 2017-12-01 MED ORDER — LIDOCAINE HCL (PF) 1 % IJ SOLN: 5.0000 mL | INTRAMUSCULAR | Status: DC | PRN

## 2017-12-01 MED ORDER — HEPARIN SODIUM (PORCINE) 1000 UNIT/ML DIALYSIS: 1000.0000 [IU] | INTRAMUSCULAR | Status: DC | PRN

## 2017-12-01 MED ORDER — CHLORHEXIDINE GLUCONATE CLOTH 2 % EX PADS: 6.0000 | MEDICATED_PAD | Freq: Every day | CUTANEOUS | Status: DC

## 2017-12-01 MED ORDER — DOXERCALCIFEROL 4 MCG/2ML IV SOLN
3.0000 ug | INTRAVENOUS | Status: DC
  Administered 2017-12-01 – 2017-12-03 (×2): 3 ug via INTRAVENOUS
  Filled 2017-12-01 (×2): qty 2

## 2017-12-01 MED ORDER — DOXERCALCIFEROL 4 MCG/2ML IV SOLN
INTRAVENOUS | Status: AC
  Administered 2017-12-01: 3 ug via INTRAVENOUS
  Filled 2017-12-01: qty 2

## 2017-12-01 MED ORDER — MIDODRINE HCL 5 MG PO TABS: 10.0000 mg | ORAL_TABLET | Freq: Every day | ORAL | Status: DC | PRN

## 2017-12-01 MED ORDER — BACLOFEN 5 MG HALF TABLET: 5.0000 mg | ORAL_TABLET | Freq: Three times a day (TID) | ORAL | Status: DC | PRN

## 2017-12-01 MED ORDER — ALTEPLASE 2 MG IJ SOLR: 2.0000 mg | Freq: Once | INTRAMUSCULAR | Status: DC | PRN

## 2017-12-01 MED ORDER — RENA-VITE PO TABS
1.0000 | ORAL_TABLET | Freq: Every day | ORAL | Status: DC
Start: 2017-12-01 — End: 2017-12-04
  Administered 2017-12-01 – 2017-12-03 (×3): 1 via ORAL
  Filled 2017-12-01 (×4): qty 1

## 2017-12-01 MED ORDER — DARBEPOETIN ALFA 150 MCG/0.3ML IJ SOSY
PREFILLED_SYRINGE | INTRAMUSCULAR | Status: AC
  Administered 2017-12-01: 150 ug via INTRAVENOUS
  Filled 2017-12-01: qty 0.3

## 2017-12-01 MED ORDER — MIDODRINE HCL 5 MG PO TABS: 10.0000 mg | ORAL_TABLET | ORAL | Status: DC

## 2017-12-01 NOTE — Progress Notes (Signed)
Pt. Arrived back from dialysis. Received report from dialysis RN

## 2017-12-01 NOTE — Consult Note (Addendum)
[facility] Renal Consultation Note    Indication for Consultation:  Management of ESRD/hemodialysis; anemia, hypertension/volume and secondary hyperparathyroidism PCP: Colin Benton, MD  HPI: Terry [name]. is a 83 y.o. male with ESRD secondary to DM ono MWF HD at [facility] with a history of COPD/ephylsema, afib on chronic coumadin, multiple admission for PNA and respiratory issues, multi vessel CAD and recent admission to [facility] for exacerbation of COPD with parainfluenza and discharged home with a prendisone taper and restarting MTP.  He had been using home inhalers and nebs without improvement.  [name]  He was seen at dialysis Monday c/o SOB with low O2 sats 87%.with a plan for him to go to the ED if he didn't improve on HD.  Post HD, he had a net UF of 2.5 wt 69.4 (EDW 68); UF was limited by lowest BP in the 80s which was typical for him. He was transfer to the ED post HD.  Evaluation in the ED showed elevated HR (low 100s) WBC 10.4 - post HD K was 4 up to 5.9 late yesterday.   EKG in the ED showed rapid afib. CXR was suspicious for right base PNA/atx with emphysematous changes.WBC was 10.4 up to 15.9 today but he is getting IV steroids. He has been afebrile.  He was empirically started on Vanc and Zosyn and admitted by the Hospitalist service.  We are consulted today for routine dialysis.    Past Medical History:  Diagnosis Date  . Acute and chronic respiratory failure 10/22/2012  . Anxiety   . Arthritis    "hands, knees" (03/15/2017)  . Atrial fibrillation ([place])   . CAD (coronary artery disease) [place], [name]  . CHF (congestive heart failure) ([place]) 09/2012  . COPD (chronic obstructive pulmonary disease) ([place])   . ESRD (end stage renal disease) on dialysis [facility])    "MWF; [facility], [place]" (03/15/2017)  . Flu 09/24/2016   influenza b  . HCAP (healthcare-associated pneumonia) 03/15/2017  . High cholesterol   . History of blood transfusion 2012   "related to bowel  resection"  . Hypertension   . On home oxygen therapy    "1L typically at night" (03/15/2017)  . Osteoarthritis 10/22/2012  . Pneumonia April 2014; 09/2016  . Shortness of breath dyspnea   . Stroke [facility]) 2012   denies residual on 03/15/2017  . Stroke [facility]) ~ 2013   "small one in his right eye"   Past Surgical History:  Procedure Laterality Date  . APPENDECTOMY  1984  . AV FISTULA PLACEMENT Right 05/21/2015   Procedure: Right Arm Brachiocephalic ARTERIOVENOUS (AV) FISTULA CREATION;  Surgeon: Angelia Mould, MD;  Location: [place];  Service: Vascular;  Laterality: Right;  . CATARACT EXTRACTION W/ INTRAOCULAR LENS  IMPLANT, BILATERAL Bilateral   . COLECTOMY  2012   partial colon removed - twisted bowel  . CORONARY ANGIOPLASTY WITH STENT PLACEMENT  1999   2 stents  . CYSTOSCOPY/RETROGRADE/URETEROSCOPY Bilateral 04/23/2014   Procedure: CYSTOSCOPY BILATERAL RETROGRADE,  LEFT URETEROSCOPY, LEFT STENT PLACEMENT;  Surgeon: Raynelle Bring, MD;  Location: [facility];  Service: Urology;  Laterality: Bilateral;  . EYE SURGERY Left    "surgery to clean off lens after cataract OR"  . INGUINAL HERNIA REPAIR Right    Family History  Problem Relation Age of Onset  . Cancer Mother        Breast  . Heart disease Father   . Heart attack Father   . Parkinson's disease  Brother    Social History:  reports that he quit smoking about 17 years ago. His smoking use included cigarettes, pipe, and cigars. He has a 49.00 pack-year smoking history. He has quit using smokeless tobacco. His smokeless tobacco use included chew. He reports that he drinks alcohol. He reports that he does not use drugs. Allergies  Allergen Reactions  . Yellow Dyes (Non-Tartrazine) Other (See Comments)    Burns arms; Cough medications (tussionex and benzonatate ok)  . Levaquin [Levofloxacin In D5w] Itching  . Levofloxacin Nausea And Vomiting   Prior to Admission medications   Medication Sig Start Date End Date Taking? Authorizing  Provider  acetaminophen (TYLENOL) 500 MG tablet Take 1,000 mg by mouth as needed for mild pain or headache.   Yes [provider]  albuterol (PROVENTIL HFA;VENTOLIN HFA) 108 (90 Base) MCG/ACT inhaler Inhale 2 puffs into the lungs every 4 (four) hours as needed for wheezing or shortness of breath. 11/21/17  Yes Orlie Dakin, MD  albuterol (PROVENTIL) (2.5 MG/3ML) 0.083% nebulizer solution Take 3 mLs (2.5 mg total) by nebulization every 4 (four) hours as needed for wheezing or shortness of breath. 11/21/17  Yes Orlie Dakin, MD  aspirin EC 81 MG tablet Take 81 mg by mouth daily.   Yes [provider]  atorvastatin (LIPITOR) 80 MG tablet Take 1 tablet (80 mg total) by mouth daily. 11/10/17  Yes Burnell Blanks, MD  budesonide-formoterol [facility]) 160-4.5 MCG/ACT inhaler Inhale 2 puffs into the lungs 2 (two) times daily.   Yes [provider]  calcium acetate (PHOSLO) 667 MG capsule Take 667-1,334 mg by mouth 3 (three) times daily with meals.  01/09/16  Yes [provider]  cetirizine (ZYRTEC) 10 MG tablet Take 10 mg by mouth as needed for allergies.   Yes [provider]  clopidogrel (PLAVIX) 75 MG tablet Take 1 tablet (75 mg total) by mouth daily. 11/10/17  Yes Burnell Blanks, MD  dextromethorphan-guaiFENesin [facility] DM) 30-600 MG 12hr tablet Take 1 tablet by mouth 2 (two) times daily as needed for cough.   Yes [provider]  ethyl chloride spray Apply 1 application topically See admin instructions. Spray at dialysis on Mon/Wed/Fri as directed 05/28/17  Yes [provider]  guaiFENesin (MUCINEX) 600 MG 12 hr tablet Take 600 mg by mouth 2 (two) times daily as needed for to loosen phlegm.   Yes [provider]  HYDROcodone-homatropine (HYCODAN) 5-1.5 MG/5ML syrup Take 5 mLs by mouth every 4 (four) hours as needed for cough. 11/27/17  Yes Tat, Shanon Brow, MD  methocarbamol (ROBAXIN) 500 MG tablet Take 500 mg by mouth 2  (two) times daily as needed for muscle spasms.   Yes [provider]  metoprolol succinate (TOPROL-XL) 50 MG 24 hr tablet Take 50 mg by mouth 2 (two) times daily. Take with or immediately following a meal.    Yes [provider]  midodrine (PROAMATINE) 10 MG tablet Take 10 mg by mouth See admin instructions. Take 10 mg by mouth on Mon/Wed/Fri at dialysis as needed for low blood pressure   Yes [provider]  OXYGEN Inhale 1-2 L into the lungs daily as needed (for shortness of breath).    Yes [provider]  oxymetazoline (AFRIN) 0.05 % nasal spray Place 1 spray into both nostrils once as needed (to stop nose bleeds).    Yes [provider]  predniSONE (DELTASONE) 10 MG tablet Take 6 tablets (60 mg total) by mouth daily with breakfast. And decrease  by one tablet daily 11/28/17  Yes Tat, David, MD  Glycopyrrolate-Formoterol (BEVESPI AEROSPHERE) 9-4.8 MCG/ACT AERO Inhale 2 puffs into the lungs 2 (two) times daily. Patient not taking: Reported on 11/29/2017 11/18/17   Tanda Rockers, MD  predniSONE (DELTASONE) 5 MG tablet Take 2 tablets (10 mg total) by mouth daily with breakfast. 11/21/17   Orlie Dakin, MD   Current Facility-Administered Medications  Medication Dose Route Frequency Provider Last Rate Last Dose  . acetaminophen (TYLENOL) tablet 650 mg  650 mg Oral Q6H PRN Ivor Costa, MD      . aspirin EC tablet 81 mg  81 mg Oral Daily Ivor Costa, MD   81 mg at 12/01/17 0933  . atorvastatin (LIPITOR) tablet 80 mg  80 mg Oral Daily Ivor Costa, MD   80 mg at 12/01/17 0931  . baclofen (LIORESAL) tablet 5 mg  5 mg Oral TID PRN Oswald Hillock, MD      . calcium acetate (PHOSLO) capsule [phone] mg  761-6,073 mg Oral TID WC Ivor Costa, MD   667 mg at 12/01/17 0932  . clopidogrel (PLAVIX) tablet 75 mg  75 mg Oral Daily Ivor Costa, MD   75 mg at 12/01/17 0933  . guaiFENesin (MUCINEX) 12 hr tablet 600 mg  600 mg Oral BID PRN Ivor Costa, MD      . heparin injection  5,000 Units  5,000 Units Subcutaneous Q8H Ivor Costa, MD   5,000 Units at 12/01/17 [phone]  . hydrALAZINE (APRESOLINE) injection 5 mg  5 mg Intravenous Q2H PRN Ivor Costa, MD      . HYDROcodone-homatropine [facility]) 5-1.5 MG/5ML syrup 5 mL  5 mL Oral Q4H PRN Ivor Costa, MD      . ipratropium (ATROVENT) nebulizer solution 0.5 mg  0.5 mg Nebulization Q6H Ivor Costa, MD   0.5 mg at 12/01/17 0826  . ipratropium-albuterol (DUONEB) 0.5-2.5 (3) MG/3ML nebulizer solution 3 mL  3 mL Nebulization Q2H PRN Donne Hazel, MD      . levalbuterol [facility]) nebulizer solution 1.25 mg  1.25 mg Nebulization Q6H Ivor Costa, MD   1.25 mg at 12/01/17 0826  . loratadine (CLARITIN) tablet 10 mg  10 mg Oral Daily Ivor Costa, MD   10 mg at 12/01/17 0931  . methylPREDNISolone sodium succinate (SOLU-MEDROL) 125 mg/2 mL injection 60 mg  60 mg Intravenous Q12H Ivor Costa, MD   60 mg at 11/30/17 2201  . metoprolol succinate (TOPROL-XL) 24 hr tablet 50 mg  50 mg Oral BID Ivor Costa, MD   50 mg at 11/30/17 2143  . mometasone-formoterol (DULERA) 200-5 MCG/ACT inhaler 2 puff  2 puff Inhalation BID Ivor Costa, MD   2 puff at 12/01/17 0827  . ondansetron (ZOFRAN) injection 4 mg  4 mg Intravenous Q8H PRN Ivor Costa, MD   4 mg at 11/30/17 0034  . oxymetazoline (AFRIN) 0.05 % nasal spray 1 spray  1 spray Each Nare Once PRN Ivor Costa, MD      . piperacillin-tazobactam (ZOSYN) IVPB 3.375 g  3.375 g Intravenous Q12H Ivor Costa, MD 12.5 mL/hr at 12/01/17 0934 3.375 g at 12/01/17 0934  . polyethylene glycol (MIRALAX / GLYCOLAX) packet 17 g  17 g Oral Daily PRN Ivor Costa, MD       Labs: Basic Metabolic Panel: Recent Labs  Lab 11/26/17 0610 11/29/17 1910 11/30/17 2150  NA 131* 138 137  K 4.6 4.0 5.9*  CL 90* 93* 93*  CO2 28 31 29   GLUCOSE 162* 310* 117*  BUN 79* 40* 77*  CREATININE 5.76* 3.61* 5.67*  CALCIUM 8.6* 7.7* 8.0*  PHOS 5.4*  --   --    Liver Function Tests: Recent Labs  Lab 11/26/17 0610  ALBUMIN 3.3*   No  results for input(s): LIPASE, AMYLASE in the last 168 hours. No results for input(s): AMMONIA in the last 168 hours. CBC: Recent Labs  Lab 11/26/17 0610 11/29/17 1910 12/01/17 0257  WBC 15.1* 10.4 15.9*  NEUTROABS  --  9.5*  --   HGB 9.8* 11.1* 10.3*  HCT 32.6* 36.2* 33.9*  MCV 102.8* 100.6* 100.3*  PLT 297 270 247   Cardiac Enzymes: No results for input(s): CKTOTAL, CKMB, CKMBINDEX, TROPONINI in the last 168 hours. CBG: No results for input(s): GLUCAP in the last 168 hours. Iron Studies: No results for input(s): IRON, TIBC, TRANSFERRIN, FERRITIN in the last 72 hours. Studies/Results: Dg Chest 2 View  Result Date: 11/29/2017 CLINICAL DATA:  Dyspnea x1 day. EXAM: CHEST - 2 VIEW COMPARISON:  11/22/2017 CXR, chest CT 07/16/2017 FINDINGS: Emphysematous hyperinflation of the lungs. Pulmonary opacities at the right lung base consistent with atelectasis and/or pneumonia. Follow-up to clearance recommended. Subsegmental atelectasis noted at the left lung base. Heart is within normal limits for size. There is aortic atherosclerosis. Rotator cuff calcific tendinopathy is seen bilaterally. IMPRESSION: Emphysematous lungs with right basilar opacity suspicious for pneumonia and/or atelectasis. Followup PA and lateral chest X-ray is recommended in 3-4 weeks following trial of antibiotic therapy to ensure resolution and exclude underlying malignancy. Electronically Signed   By: Ashley Royalty M.D.   On: 11/29/2017 19:00   Dg Foot Complete Left  Result Date: 11/29/2017 CLINICAL DATA:  Left foot and toe pain. EXAM: LEFT FOOT - COMPLETE 3+ VIEW COMPARISON:  Foot radiograph 09/28/2017 FINDINGS: No fracture or dislocation. Unchanged alignment from prior exam. Minimal osteoarthritis at the great toe metatarsal phalangeal joint and throughout the digits. No bony destruction, osseous erosions or periosteal reaction. Soft tissue edema most prominent about the dorsum of the foot. No radiopaque foreign body. There are  vascular calcifications. IMPRESSION: Dorsal soft tissue edema. Mild osteoarthritis without acute osseous abnormality. Electronically Signed   By: Jeb Levering M.D.   On: 11/29/2017 23:42    ROS: + weakness, diminished appetite- dental surgery - teeth extraction a month ago limits foods, LE leg edema (wife says sometimes left sometimes right), +SOB and cough, DOE, no N,V, D, fever or chills, no prior dialysis access interventions.  Physical Exam: Vitals:   12/01/17 0237 12/01/17 0821 12/01/17 0827 12/01/17 0831  BP:  (!) 112/51    Pulse:  95 95   Resp:  18 18   Temp:  97.8 F (36.6 C)    TempSrc:  Oral    SpO2: 94% 99% 99% 99%  Weight:      Height:         General: ill appearing elderly male eating lunch Head: NCAT sclera not icteric MMM Neck: Supple.  Lungs: dim BS throughout - poor expansion Heart: RRR 2/6 murmur Abdomen: soft NT + BS Lower extremities: LLE 2+ without significant edema on the right  Neuro: A & O  X 3. Moves all extremities spontaneously. Psych:  Responds to questions appropriately with a normal affect. Dialysis Access: right upper AVF + bruit  Dialysis Orders: [facility] MWF 4hr EDW 68 2 K 2 Ca AVF profile 4 no heparin hectorol 3 venvofer 50 - last 5/31 and mircera 200 last 5/31  Assessment/Plan: 1. Acute on chronic failure with hypoxia/emphysema -  per primary - more respiratory than volume medicated 2. Hyperkalemia - K 5.9 - attended full dialysis treatment Monday -watch for AVF malfunction - K was 4 post HD Monday - has never had intervention - AF trending down 3. ESRD -  MWF - HD today repeat K pre HD; could have had some unintentional weight loss - all lower teeth removed about a month ago. 4. Hypertension/volume  - prone to intradialytic BP drops - on midodrine 10 pre and 10 mid treatment; reassess EDW while here 5. Anemia  - hgb 10.3 - due for ESA reduce- give Aranesp 150 and weekly Fe 6. Metabolic bone disease -  Continue hectorol/binders 2 ac 7. Nutrition  -renal diet/vit - fluid restriction 8. ESRD contraindicated meds - d/c'd baclofen - this is contraindicated with ESRD -high risk for AMS 9. Afib - per primary -hx GIB so not on anticoagulant/on ; MTP succ 10. DM - per primary   Myriam Jacobson, PA-C [place] [phone] 12/01/2017, 11:44 AM   Pt seen, examined and agree w A/P as above.  Kelly Splinter MD [employer] pager [phone]   12/01/2017, 2:58 PM

## 2017-12-01 NOTE — Procedures (Signed)
   I was present at this dialysis session, have reviewed the session itself and made  appropriate changes Kelly Splinter MD [place] pager [phone]   12/01/2017, 4:45 PM

## 2017-12-01 NOTE — Progress Notes (Signed)
[facility]  PROGRESS NOTE  Terry [name]. CLE:[id number] DOB: 07/06/1934 DOA: 11/29/2017 PCP: [name], DO   Brief HPI:   83 y.o.malewith medical history significant ofCOPD, chronic respiratory failure on 2-3 L nasal cannula oxygen as needed, hypertension, hyperlipidemia, stroke, atrial fibrillation not on anticoagulants due to GI bleeding, CAD,s/p ofstent placement, CHFwith EF 45%, ESHD-HD (MWF), aortic stenosis, who presents with shortness of breath. Patient was recently hospitalized from 6/3-6/8 due to COPD exacerbation2/2parainfluenza. He is wearing2-3 LofO2prnat home, nowrequiring3 L nasal cannula oxygen in ED.     Subjective   Patient seen and examined, continues to have dyspnea on exertion.  He is requiring oxygen continuously in the hospital now.   Assessment/Plan:     1. Continue oxygen therapy.-multifactorial, patient has underlying COPD volume overload in setting of end-stage renal disease, concern for aspiration.  Chest x-ray shows right basilar opacity suspicious for pneumonia,.Patient currently started on IV Zosyn.   Solu-Medrol 60 mg every 6 hours Xopenex nebulizer every 6 hours, ipratropium nebulizer every 6 hours,  2. 1 nephrology has been consulted patient get dialyzed on Monday Wednesday and Friday.  Hemodialysis today. 3. Acute on chronic combined systolic and diastolic CHF-hopefully will improve after hemodialysis today. 4. .  Hypertension-continue metoprolol 5. CAD status post DES stent-continue aspirin, Plavix, Lipitor 6. Atrial fibrillation- CHA2DS2VASc score is 5, patient is not on anticoagulation due to history of GI bleed.  Continue metoprolol for rate control. 7. Anemia of chronic disease.  Hemoglobin is stable.- 8. Hiccups/muscle spasm-patient was started on baclofen this morning wake up/muscle spasm.  Nephrology has discontinued this medication as it is contraindicated as ESRD.   DVT prophylaxis: Heparin  Code Status: Full  code  Family Communication: No family at bedside  Disposition Plan: likely home when medically ready for discharge   Consultants:  Nephrology  Procedures:  None*   Antibiotics:   Anti-infectives (From admission, onward)   Start     Dose/Rate Route Frequency Ordered Stop   12/01/17 1200  vancomycin (VANCOCIN) IVPB 750 mg/150 ml premix  Status:  Discontinued     750 mg 150 mL/hr over 60 Minutes Intravenous Every M-W-F (Hemodialysis) 11/29/17 1948 11/29/17 2205   11/29/17 2100  piperacillin-tazobactam (ZOSYN) IVPB 3.375 g     3.375 g 12.5 mL/hr over 240 Minutes Intravenous Every 12 hours 11/29/17 1948     11/29/17 1945  vancomycin (VANCOCIN) 1,500 mg in sodium chloride 0.9 % 500 mL IVPB     1,500 mg 250 mL/hr over 120 Minutes Intravenous  Once 11/29/17 1948 11/29/17 2340       Objective   Vitals:   12/01/17 0821 12/01/17 0827 12/01/17 0831 12/01/17 1423  BP: (!) 112/51     Pulse: 95 95  86  Resp: 18 18  18   Temp: 97.8 F (36.6 C)     TempSrc: Oral     SpO2: 99% 99% 99% 99%  Weight:      Height:        Intake/Output Summary (Last 24 hours) at 12/01/2017 1550 Last data filed at 12/01/2017 0150 Gross per 24 hour  Intake 590 ml  Output -  Net 590 ml   Filed Weights   11/29/17 1806  Weight: 67.1 kg (148 lb)     Physical Examination:   Physical Exam: Eyes: No icterus, extraocular muscles intact  Mouth: Oral mucosa is moist, no lesions on palate,  Neck: Supple, no deformities, masses, or tenderness Lungs: Normal respiratory effort, decreased breath sounds bilaterally Heart:  Regular rate and rhythm, S1 and S2 normal, no murmurs, rubs auscultated Abdomen: BS normoactive,soft,nondistended,non-tender to palpation,no organomegaly Extremities: Trace edema of lower extremities,, no erythema, no cyanosis, no clubbing Neuro : Alert and oriented to time, place and person, No focal deficits     Data Reviewed: I have personally reviewed following labs and imaging  studies  CBG: No results for input(s): GLUCAP in the last 168 hours.  CBC: Recent Labs  Lab 11/26/17 0610 11/29/17 1910 12/01/17 0257  WBC 15.1* 10.4 15.9*  NEUTROABS  --  9.5*  --   HGB 9.8* 11.1* 10.3*  HCT 32.6* 36.2* 33.9*  MCV 102.8* 100.6* 100.3*  PLT 297 270 174    Basic Metabolic Panel: Recent Labs  Lab 11/26/17 0610 11/29/17 1910 11/30/17 2150  NA 131* 138 137  K 4.6 4.0 5.9*  CL 90* 93* 93*  CO2 28 31 29   GLUCOSE 162* 310* 117*  BUN 79* 40* 77*  CREATININE 5.76* 3.61* 5.67*  CALCIUM 8.6* 7.7* 8.0*  PHOS 5.4*  --   --     Recent Results (from the past 240 hour(s))  MRSA PCR Screening     Status: Abnormal   Collection Time: 11/23/17  3:40 AM  Result Value Ref Range Status   MRSA by PCR POSITIVE (A) NEGATIVE Corrected    Comment: MISTY SMART 11/23/17 @ 0902 [id number]        The GeneXpert MRSA Assay (FDA approved for NASAL specimens only), is one component of a comprehensive MRSA colonization surveillance program. It is not intended to diagnose MRSA infection nor to guide or monitor treatment for MRSA infections. Performed at [facility], [address] [place], [place] [address] CORRECTED ON 06/04 AT 7591: PREVIOUSLY REPORTED AS POSITIVE        The GeneXpert MRSA Assay (FDA approved for NASAL specimens only), is one component of a comprehensive MRSA colonization surveillance program. It is not intended to diagnose MRSA infection  nor to guide or monitor treatment for MRSA infections. RESULT CALLED TO, READ BACK BY AND VERIFIED WITH: MISTY SMART 11/23/17 @ 0902N [id number]   Respiratory Panel by PCR     Status: Abnormal   Collection Time: 11/24/17 11:40 AM  Result Value Ref Range Status   Adenovirus NOT DETECTED NOT DETECTED Final   Coronavirus 229E NOT DETECTED NOT DETECTED Final   Coronavirus HKU1 NOT DETECTED NOT DETECTED Final   Coronavirus NL63 NOT DETECTED NOT DETECTED Final   Coronavirus OC43 NOT DETECTED NOT DETECTED Final   Metapneumovirus NOT  DETECTED NOT DETECTED Final   Rhinovirus / Enterovirus NOT DETECTED NOT DETECTED Final   Influenza A NOT DETECTED NOT DETECTED Final   Influenza B NOT DETECTED NOT DETECTED Final   Parainfluenza Virus 1 NOT DETECTED NOT DETECTED Final   Parainfluenza Virus 2 NOT DETECTED NOT DETECTED Final   Parainfluenza Virus 3 DETECTED (A) NOT DETECTED Final   Parainfluenza Virus 4 NOT DETECTED NOT DETECTED Final   Respiratory Syncytial Virus NOT DETECTED NOT DETECTED Final   Bordetella pertussis NOT DETECTED NOT DETECTED Final   Chlamydophila pneumoniae NOT DETECTED NOT DETECTED Final   Mycoplasma pneumoniae NOT DETECTED NOT DETECTED Final  Culture, blood (routine x 2) Call MD if unable to obtain prior to antibiotics being given     Status: None (Preliminary result)   Collection Time: 11/30/17  6:13 AM  Result Value Ref Range Status   Specimen Description BLOOD SITE NOT SPECIFIED  Final   Special Requests   Final  BOTTLES DRAWN AEROBIC AND ANAEROBIC Blood Culture adequate volume   Culture   Final    NO GROWTH 1 DAY Performed at [facility], [place] [address] [place], [place] [address]    Report Status PENDING  Incomplete  Culture, blood (routine x 2) Call MD if unable to obtain prior to antibiotics being given     Status: None (Preliminary result)   Collection Time: 11/30/17  6:13 AM  Result Value Ref Range Status   Specimen Description BLOOD SITE NOT SPECIFIED  Final   Special Requests   Final    BOTTLES DRAWN AEROBIC AND ANAEROBIC Blood Culture adequate volume   Culture   Final    NO GROWTH 1 DAY Performed at [facility], [place] [address], [place], [place] [address]    Report Status PENDING  Incomplete     Liver Function Tests: Recent Labs  Lab 11/26/17 0610  ALBUMIN 3.3*   No results for input(s): LIPASE, AMYLASE in the last 168 hours. No results for input(s): AMMONIA in the last 168 hours.  Cardiac Enzymes: No results for input(s): CKTOTAL, CKMB, CKMBINDEX, TROPONINI  in the last 168 hours. BNP (last 3 results) Recent Labs    11/22/17 2145 11/29/17 1910  BNP 659.0* 565.3*    ProBNP (last 3 results) No results for input(s): PROBNP in the last 8760 hours.    Studies: Dg Chest 2 View  Result Date: 11/29/2017 CLINICAL DATA:  Dyspnea x1 day. EXAM: CHEST - 2 VIEW COMPARISON:  11/22/2017 CXR, chest CT 07/16/2017 FINDINGS: Emphysematous hyperinflation of the lungs. Pulmonary opacities at the right lung base consistent with atelectasis and/or pneumonia. Follow-up to clearance recommended. Subsegmental atelectasis noted at the left lung base. Heart is within normal limits for size. There is aortic atherosclerosis. Rotator cuff calcific tendinopathy is seen bilaterally. IMPRESSION: Emphysematous lungs with right basilar opacity suspicious for pneumonia and/or atelectasis. Followup PA and lateral chest X-ray is recommended in 3-4 weeks following trial of antibiotic therapy to ensure resolution and exclude underlying malignancy. Electronically Signed   By: Ashley Royalty M.D.   On: 11/29/2017 19:00   Dg Foot Complete Left  Result Date: 11/29/2017 CLINICAL DATA:  Left foot and toe pain. EXAM: LEFT FOOT - COMPLETE 3+ VIEW COMPARISON:  Foot radiograph 09/28/2017 FINDINGS: No fracture or dislocation. Unchanged alignment from prior exam. Minimal osteoarthritis at the great toe metatarsal phalangeal joint and throughout the digits. No bony destruction, osseous erosions or periosteal reaction. Soft tissue edema most prominent about the dorsum of the foot. No radiopaque foreign body. There are vascular calcifications. IMPRESSION: Dorsal soft tissue edema. Mild osteoarthritis without acute osseous abnormality. Electronically Signed   By: Jeb Levering M.D.   On: 11/29/2017 23:42    Scheduled Meds: . aspirin EC  81 mg Oral Daily  . atorvastatin  80 mg Oral Daily  . calcium acetate  667-1,334 mg Oral TID WC  . [START ON 12/02/2017] Chlorhexidine Gluconate Cloth  6 each Topical  Q0600  . clopidogrel  75 mg Oral Daily  . darbepoetin (ARANESP) injection - DIALYSIS  150 mcg Intravenous Q Wed-HD  . doxercalciferol  3 mcg Intravenous Q M,W,F-HD  . heparin  5,000 Units Subcutaneous Q8H  . ipratropium  0.5 mg Nebulization Q6H  . levalbuterol  1.25 mg Nebulization Q6H  . loratadine  10 mg Oral Daily  . methylPREDNISolone (SOLU-MEDROL) injection  60 mg Intravenous Q12H  . metoprolol succinate  50 mg Oral BID  . [START ON 12/03/2017] midodrine  10 mg Oral Q M,W,F-HD  . mometasone-formoterol  2 puff Inhalation BID  . multivitamin  1 tablet Oral QHS      Time spent: 25 min  Oswald Hillock   [facility] Pager [phone]. If 7PM-7AM, please contact night-coverage at [email or web address], Office  [phone]  password [email or web address]  12/01/2017, 3:50 PM  LOS: 2 days

## 2017-12-02 ENCOUNTER — Inpatient Hospital Stay (HOSPITAL_COMMUNITY): Admit: 2017-12-02 | Payer: Medicare PPO

## 2017-12-02 DIAGNOSIS — I2510 Atherosclerotic heart disease of native coronary artery without angina pectoris: Secondary | ICD-10-CM

## 2017-12-02 DIAGNOSIS — I350 Nonrheumatic aortic (valve) stenosis: Secondary | ICD-10-CM

## 2017-12-02 DIAGNOSIS — Z992 Dependence on renal dialysis: Secondary | ICD-10-CM

## 2017-12-02 DIAGNOSIS — N186 End stage renal disease: Secondary | ICD-10-CM

## 2017-12-02 DIAGNOSIS — I482 Chronic atrial fibrillation: Secondary | ICD-10-CM

## 2017-12-02 DIAGNOSIS — I5043 Acute on chronic combined systolic (congestive) and diastolic (congestive) heart failure: Secondary | ICD-10-CM

## 2017-12-02 DIAGNOSIS — J9621 Acute and chronic respiratory failure with hypoxia: Secondary | ICD-10-CM

## 2017-12-02 MED ORDER — SODIUM CHLORIDE 0.9 % IV SOLN: 100.0000 mL | INTRAVENOUS | Status: DC | PRN

## 2017-12-02 MED ORDER — IPRATROPIUM BROMIDE 0.02 % IN SOLN
0.5000 mg | Freq: Three times a day (TID) | RESPIRATORY_TRACT | Status: DC
  Administered 2017-12-03 – 2017-12-04 (×3): 0.5 mg via RESPIRATORY_TRACT
  Filled 2017-12-02 (×4): qty 2.5

## 2017-12-02 MED ORDER — CHLORHEXIDINE GLUCONATE CLOTH 2 % EX PADS
6.0000 | MEDICATED_PAD | Freq: Every day | CUTANEOUS | Status: DC
  Administered 2017-12-04: 6 via TOPICAL

## 2017-12-02 MED ORDER — POLYETHYLENE GLYCOL 3350 17 G PO PACK: 17.0000 g | PACK | Freq: Every day | ORAL | Status: DC

## 2017-12-02 MED ORDER — LEVALBUTEROL HCL 1.25 MG/0.5ML IN NEBU
1.2500 mg | INHALATION_SOLUTION | Freq: Three times a day (TID) | RESPIRATORY_TRACT | Status: DC
Start: 2017-12-03 — End: 2017-12-04
  Administered 2017-12-03 – 2017-12-04 (×3): 1.25 mg via RESPIRATORY_TRACT
  Filled 2017-12-02 (×4): qty 0.5

## 2017-12-02 MED ORDER — FAMOTIDINE 20 MG PO TABS
20.0000 mg | ORAL_TABLET | Freq: Every day | ORAL | Status: DC
  Administered 2017-12-02 – 2017-12-03 (×2): 20 mg via ORAL
  Filled 2017-12-02: qty 1

## 2017-12-02 MED ORDER — CALCIUM ACETATE (PHOS BINDER) 667 MG PO CAPS
1334.0000 mg | ORAL_CAPSULE | Freq: Three times a day (TID) | ORAL | Status: DC
  Administered 2017-12-02 – 2017-12-04 (×4): 1334 mg via ORAL
  Filled 2017-12-02 (×5): qty 2

## 2017-12-02 NOTE — Progress Notes (Signed)
[facility] Progress Note  Subjective: no new c/o, still coughing, can't lie flat due to coughing  Vitals:   12/02/17 0111 12/02/17 0834 12/02/17 0928 12/02/17 0933  BP: (!) 122/59 (!) 127/43    Pulse: (!) 102 97 (!) 106   Resp: 18 15 16    Temp: 98.9 F (37.2 C) 98.9 F (37.2 C)    TempSrc: Oral Oral    SpO2:  96% 100% 100%  Weight:      Height:        Inpatient medications: . aspirin EC  81 mg Oral Daily  . atorvastatin  80 mg Oral Daily  . calcium acetate  1,334 mg Oral TID WC  . Chlorhexidine Gluconate Cloth  6 each Topical Q0600  . clopidogrel  75 mg Oral Daily  . darbepoetin (ARANESP) injection - DIALYSIS  150 mcg Intravenous Q Wed-HD  . doxercalciferol  3 mcg Intravenous Q M,W,F-HD  . heparin  5,000 Units Subcutaneous Q8H  . ipratropium  0.5 mg Nebulization Q6H  . levalbuterol  1.25 mg Nebulization Q6H  . loratadine  10 mg Oral Daily  . methylPREDNISolone (SOLU-MEDROL) injection  60 mg Intravenous Q6H  . metoprolol succinate  50 mg Oral BID  . [START ON 12/03/2017] midodrine  10 mg Oral Q M,W,F-HD  . mometasone-formoterol  2 puff Inhalation BID  . multivitamin  1 tablet Oral QHS   . sodium chloride    . sodium chloride    . [START ON 12/08/2017] ferric gluconate (FERRLECIT/NULECIT) IV Stopped (12/01/17 2040)  . piperacillin-tazobactam (ZOSYN)  IV 3.375 g (12/02/17 0856)   sodium chloride, sodium chloride, acetaminophen, alteplase, guaiFENesin, heparin, hydrALAZINE, HYDROcodone-homatropine, ipratropium-albuterol, lidocaine (PF), lidocaine-prilocaine, midodrine, ondansetron (ZOFRAN) IV, oxymetazoline, pentafluoroprop-tetrafluoroeth, polyethylene glycol  Exam: General: ill appearing elderly male eating lunch Head: NCAT sclera not icteric MMM Neck: Supple.  Lungs: dim BS throughout - poor expansion, no rales or wheezing Heart: RRR 2/6 murmur Abdomen: soft NT + BS Lower extremities: 2+ edema L leg and 1+ R lower leg Neuro: A & O  X 3. Moves all  extremities spontaneously. Psych:  Responds to questions appropriately with a normal affect. Dialysis Access: right upper AVF + bruit  Dialysis Orders: [facility] MWF  4h  68kg  2/2 bath p4  Heparin none RUA AVF - hectorol 3  - venvofer 50 last 5/31 - mircera 200 last 5/31  Assessment/Plan: 1. Acute on chronic failure with hypoxia/emphysema - no signs of pulm edema on CXR / exam.  Going "downhill"  per the wife, she is concerned about heart valve and coronary disease issues, requesting cardiology input.  Will d/w primary MD.  2. Hyperkalemia - K 5.9 - attended full dialysis treatment Monday -watch for AVF malfunction - HD yest and tomorrow.  3. ESRD -  MWF - HD today repeat K pre HD; could have had some unintentional weight loss. Lowering dry wt here.  4. Hypertension/volume  - prone to intradialytic BP drops - on midodrine 10 pre and 10 mid treatment 5. Anemia  - hgb 10.3 - due for ESA reduce- give Aranesp 150 and weekly Fe 6. Metabolic bone disease -  Continue hectorol/binders 2 ac 7. Nutrition -renal diet/vit - fluid restriction 8. ESRD contraindicated meds - d/c'd baclofen - this is contraindicated with ESRD -high risk for AMS 9. Afib - per primary -hx GIB so not on anticoagulant/on ; MTP succ 10. DM - per primary   Plan - as above   Kelly Splinter MD [employer] pager  [phone]   12/02/2017, 11:29 AM   Recent Labs  Lab 11/26/17 0610 11/29/17 1910 11/30/17 2150 12/01/17 1730  NA 131* 138 137 137  K 4.6 4.0 5.9* 5.7*  CL 90* 93* 93* 92*  CO2 28 31 29 26   GLUCOSE 162* 310* 117* 208*  BUN 79* 40* 77* 100*  CREATININE 5.76* 3.61* 5.67* 6.89*  CALCIUM 8.6* 7.7* 8.0* 8.1*  PHOS 5.4*  --   --  8.1*   Recent Labs  Lab 11/26/17 0610 12/01/17 1730  ALBUMIN 3.3* 2.6*   Recent Labs  Lab 11/26/17 0610 11/29/17 1910 12/01/17 0257  WBC 15.1* 10.4 15.9*  NEUTROABS  --  9.5*  --   HGB 9.8* 11.1* 10.3*  HCT 32.6* 36.2* 33.9*  MCV 102.8* 100.6* 100.3*  PLT 297  270 247   Iron/TIBC/Ferritin/ %Sat    Component Value Date/Time   IRON 39 (L) 03/15/2017 1223   TIBC 230 (L) 03/15/2017 1223   FERRITIN 1,075 (H) 03/15/2017 1223   IRONPCTSAT 17 (L) 03/15/2017 1223

## 2017-12-02 NOTE — Progress Notes (Signed)
[facility]  PROGRESS NOTE  Terry [name]. CBS:[id number] DOB: [birth year] DOA: 11/29/2017 PCP: [name], DO   Brief HPI:   83 y.o.malewith medical history significant ofCOPD, chronic respiratory failure on 2-3 L nasal cannula oxygen as needed, hypertension, hyperlipidemia, stroke, atrial fibrillation not on anticoagulants due to GI bleeding, CAD,s/p ofstent placement, CHFwith EF 45%, ESHD-HD (MWF), aortic stenosis, who presents with shortness of breath. Patient was recently hospitalized from 6/3-6/8 due to COPD exacerbation2/2parainfluenza. He is wearing2-3 LofO2prnat home, nowrequiring3 L nasal cannula oxygen in ED.     Subjective   Patient seen and examined, did not sleep last night due to constant hiccups.  Baclofen was discontinued by nephrology.   Assessment/Plan:     1. Acute on chronic respiratory failure with hypoxia.-multifactorial, patient has underlying COPD volume overload in setting of end-stage renal disease, concern for aspiration.  Chest x-ray shows right basilar opacity suspicious for pneumonia,.Patient currently started on IV Zosyn.   Solu-Medrol 60 mg every 6 hours Xopenex nebulizer every 6 hours, ipratropium nebulizer every 6 hours,  2. ESRD-  nephrology has been consulted patient get dialyzed on Monday Wednesday and Friday.  Hemodialysis today. 3. Acute on chronic combined systolic and diastolic CHF-stable, do not feel that patient has significant pulmonary edema.  Lungs are clear to auscultation. 4. Aortic stenosis-patient has moderate aortic stenosis as per echo from March 2019, family requesting cardiology consultation.  Will consult cardiology for further recommendations. 5.   Hypertension-continue metoprolol 6. CAD status post DES stent-continue aspirin, Plavix, Lipitor 7. Atrial fibrillation- CHA2DS2VASc score is 5, patient is not on anticoagulation due to history of GI bleed.  Continue metoprolol for rate control. 8. Anemia of chronic  disease.  Hemoglobin is stable.- 9. Hiccups/muscle spasm-patient was started on baclofen which was discontinued by nephrology.  Will start Pepcid 20 mg p.o. daily.   DVT prophylaxis: Heparin  Code Status: Full code  Family Communication: No family at bedside  Disposition Plan: likely home when medically ready for discharge   Consultants:  Nephrology  Procedures:  None*   Antibiotics:   Anti-infectives (From admission, onward)   Start     Dose/Rate Route Frequency Ordered Stop   12/01/17 1200  vancomycin (VANCOCIN) IVPB 750 mg/150 ml premix  Status:  Discontinued     750 mg 150 mL/hr over 60 Minutes Intravenous Every M-W-F (Hemodialysis) 11/29/17 1948 11/29/17 2205   11/29/17 2100  piperacillin-tazobactam (ZOSYN) IVPB 3.375 g     3.375 g 12.5 mL/hr over 240 Minutes Intravenous Every 12 hours 11/29/17 1948     11/29/17 1945  vancomycin (VANCOCIN) 1,500 mg in sodium chloride 0.9 % 500 mL IVPB     1,500 mg 250 mL/hr over 120 Minutes Intravenous  Once 11/29/17 1948 11/29/17 2340       Objective   Vitals:   12/02/17 0111 12/02/17 0834 12/02/17 0928 12/02/17 0933  BP: (!) 122/59 (!) 127/43    Pulse: (!) 102 97 (!) 106   Resp: 18 15 16    Temp: 98.9 F (37.2 C) 98.9 F (37.2 C)    TempSrc: Oral Oral    SpO2:  96% 100% 100%  Weight:      Height:        Intake/Output Summary (Last 24 hours) at 12/02/2017 1355 Last data filed at 12/02/2017 0800 Gross per 24 hour  Intake 506 ml  Output 2000 ml  Net -1494 ml   Filed Weights   11/29/17 1806 12/01/17 1545 12/01/17 1945  Weight: 67.1 kg (  148 lb) 67.8 kg (149 lb 7.6 oz) 65.7 kg (144 lb 13.5 oz)     Physical Examination:  Physical Exam:  Mouth: Oral mucosa is moist, no lesions on palate,  Neck: Supple, no deformities, masses, or tenderness Lungs: Normal respiratory effort, bilateral clear to auscultation, no crackles or wheezes.  Heart: Regular rate and rhythm, S1 and S2 normal, no murmurs, rubs  auscultated Abdomen: BS normoactive,soft,nondistended,non-tender to palpation,no organomegaly Extremities: No pretibial edema, no erythema, no cyanosis, no clubbing Neuro : Alert and oriented to time, place and person, No focal deficits     Data Reviewed: I have personally reviewed following labs and imaging studies  CBG: No results for input(s): GLUCAP in the last 168 hours.  CBC: Recent Labs  Lab 11/26/17 0610 11/29/17 1910 12/01/17 0257  WBC 15.1* 10.4 15.9*  NEUTROABS  --  9.5*  --   HGB 9.8* 11.1* 10.3*  HCT 32.6* 36.2* 33.9*  MCV 102.8* 100.6* 100.3*  PLT 297 270 169    Basic Metabolic Panel: Recent Labs  Lab 11/26/17 0610 11/29/17 1910 11/30/17 2150 12/01/17 1730 12/02/17 1101  NA 131* 138 137 137 139  K 4.6 4.0 5.9* 5.7* 4.7  CL 90* 93* 93* 92* 96*  CO2 28 31 29 26 30   GLUCOSE 162* 310* 117* 208* 212*  BUN 79* 40* 77* 100* 48*  CREATININE 5.76* 3.61* 5.67* 6.89* 4.52*  CALCIUM 8.6* 7.7* 8.0* 8.1* 8.2*  PHOS 5.4*  --   --  8.1*  --     Recent Results (from the past 240 hour(s))  MRSA PCR Screening     Status: Abnormal   Collection Time: 11/23/17  3:40 AM  Result Value Ref Range Status   MRSA by PCR POSITIVE (A) NEGATIVE Corrected    Comment: MISTY SMART 11/23/17 @ 0902 [id number]        The GeneXpert MRSA Assay (FDA approved for NASAL specimens only), is one component of a comprehensive MRSA colonization surveillance program. It is not intended to diagnose MRSA infection nor to guide or monitor treatment for MRSA infections. Performed at [facility], [address], [place], [place] [address] CORRECTED ON 06/04 AT 8101: PREVIOUSLY REPORTED AS POSITIVE        The GeneXpert MRSA Assay (FDA approved for NASAL specimens only), is one component of a comprehensive MRSA colonization surveillance program. It is not intended to diagnose MRSA infection  nor to guide or monitor treatment for MRSA infections. RESULT CALLED TO, READ BACK BY AND VERIFIED WITH:  MISTY SMART 11/23/17 @ 0902N LSCHMIDT   Respiratory Panel by PCR     Status: Abnormal   Collection Time: 11/24/17 11:40 AM  Result Value Ref Range Status   Adenovirus NOT DETECTED NOT DETECTED Final   Coronavirus 229E NOT DETECTED NOT DETECTED Final   Coronavirus HKU1 NOT DETECTED NOT DETECTED Final   Coronavirus NL63 NOT DETECTED NOT DETECTED Final   Coronavirus OC43 NOT DETECTED NOT DETECTED Final   Metapneumovirus NOT DETECTED NOT DETECTED Final   Rhinovirus / Enterovirus NOT DETECTED NOT DETECTED Final   Influenza A NOT DETECTED NOT DETECTED Final   Influenza B NOT DETECTED NOT DETECTED Final   Parainfluenza Virus 1 NOT DETECTED NOT DETECTED Final   Parainfluenza Virus 2 NOT DETECTED NOT DETECTED Final   Parainfluenza Virus 3 DETECTED (A) NOT DETECTED Final   Parainfluenza Virus 4 NOT DETECTED NOT DETECTED Final   Respiratory Syncytial Virus NOT DETECTED NOT DETECTED Final   Bordetella pertussis NOT DETECTED NOT DETECTED  Final   Chlamydophila pneumoniae NOT DETECTED NOT DETECTED Final   Mycoplasma pneumoniae NOT DETECTED NOT DETECTED Final  Culture, blood (routine x 2) Call MD if unable to obtain prior to antibiotics being given     Status: None (Preliminary result)   Collection Time: 11/30/17  6:13 AM  Result Value Ref Range Status   Specimen Description BLOOD SITE NOT SPECIFIED  Final   Special Requests   Final    BOTTLES DRAWN AEROBIC AND ANAEROBIC Blood Culture adequate volume   Culture   Final    NO GROWTH 2 DAYS Performed at [facility], [address], [place], [place] [address]    Report Status PENDING  Incomplete  Culture, blood (routine x 2) Call MD if unable to obtain prior to antibiotics being given     Status: None (Preliminary result)   Collection Time: 11/30/17  6:13 AM  Result Value Ref Range Status   Specimen Description BLOOD SITE NOT SPECIFIED  Final   Special Requests   Final    BOTTLES DRAWN AEROBIC AND ANAEROBIC Blood Culture adequate volume    Culture   Final    NO GROWTH 2 DAYS Performed at [facility], [place] [address] [place], [place] [address]    Report Status PENDING  Incomplete  Culture, sputum-assessment     Status: None   Collection Time: 12/01/17  8:45 PM  Result Value Ref Range Status   Specimen Description EXPECTORATED SPUTUM  Final   Special Requests NONE  Final   Sputum evaluation   Final    THIS SPECIMEN IS ACCEPTABLE FOR SPUTUM CULTURE Performed at [facility], [address], [place], [place] [address]    Report Status 12/01/2017 FINAL  Final  Culture, respiratory (NON-Expectorated)     Status: None (Preliminary result)   Collection Time: 12/01/17  8:45 PM  Result Value Ref Range Status   Specimen Description EXPECTORATED SPUTUM  Final   Special Requests NONE Reflexed from [id number]  Final   Gram Stain   Final    ABUNDANT WBC PRESENT, PREDOMINANTLY PMN FEW GRAM NEGATIVE RODS MODERATE YEAST    Culture   Final    CULTURE REINCUBATED FOR BETTER GROWTH Performed at [facility], [place] [address] [place], [place] [address]    Report Status PENDING  Incomplete     Liver Function Tests: Recent Labs  Lab 11/26/17 0610 12/01/17 1730  ALBUMIN 3.3* 2.6*   No results for input(s): LIPASE, AMYLASE in the last 168 hours. No results for input(s): AMMONIA in the last 168 hours.  Cardiac Enzymes: No results for input(s): CKTOTAL, CKMB, CKMBINDEX, TROPONINI in the last 168 hours. BNP (last 3 results) Recent Labs    11/22/17 2145 11/29/17 1910  BNP 659.0* 565.3*    ProBNP (last 3 results) No results for input(s): PROBNP in the last 8760 hours.    Studies: No results found.  Scheduled Meds: . aspirin EC  81 mg Oral Daily  . atorvastatin  80 mg Oral Daily  . calcium acetate  1,334 mg Oral TID WC  . Chlorhexidine Gluconate Cloth  6 each Topical Q0600  . Chlorhexidine Gluconate Cloth  6 each Topical Q0600  . clopidogrel  75 mg Oral Daily  . darbepoetin (ARANESP) injection -  DIALYSIS  150 mcg Intravenous Q Wed-HD  . doxercalciferol  3 mcg Intravenous Q M,W,F-HD  . famotidine  20 mg Oral Daily  . heparin  5,000 Units Subcutaneous Q8H  . ipratropium  0.5 mg Nebulization Q6H  . levalbuterol  1.25 mg Nebulization Q6H  . loratadine  10 mg Oral Daily  . methylPREDNISolone (SOLU-MEDROL) injection  60 mg Intravenous Q6H  . metoprolol succinate  50 mg Oral BID  . [START ON 12/03/2017] midodrine  10 mg Oral Q M,W,F-HD  . mometasone-formoterol  2 puff Inhalation BID  . multivitamin  1 tablet Oral QHS      Time spent: 25 min  Oswald Hillock   [facility] Pager [phone]. If 7PM-7AM, please contact night-coverage at [email or web address], Office  [phone]  password [email or web address]  12/02/2017, 1:55 PM  LOS: 3 days

## 2017-12-02 NOTE — Progress Notes (Signed)
Pharmacy Antibiotic Note  Terry [name]. is a 83 y.o. male admitted on 11/29/2017 with worsening SOB.  Pharmacy has been consulted for vancomycin and Zosyn dosing for PNA.  Abx D#5 for PNA - afebrile, WBC wnl. Resp Cx has GNRs growing.  Plan: Zosyn EID 3.375gm IV Q12H Monitor clinical picture, renal function F/U C&S, abx deescalation / LOT  Consider 7 total days of therapy?   Height: 5\' 8"  (172.7 cm) Weight: 144 lb 13.5 oz (65.7 kg) IBW/kg (Calculated) : 68.4  Temp (24hrs), Avg:98.6 F (37 C), Min:98.2 F (36.8 C), Max:98.9 F (37.2 C)  Recent Labs  Lab 11/26/17 0610 11/29/17 1910 11/30/17 2150 12/01/17 0257 12/01/17 1730  WBC 15.1* 10.4  --  15.9*  --   CREATININE 5.76* 3.61* 5.67*  --  6.89*    Estimated Creatinine Clearance: 7.9 mL/min (A) (by C-G formula based on SCr of 6.89 mg/dL (H)).    Allergies  Allergen Reactions  . Yellow Dyes (Non-Tartrazine) Other (See Comments)    Burns arms; Cough medications (tussionex and benzonatate ok)  . Levaquin [Levofloxacin In D5w] Itching  . Levofloxacin Nausea And Vomiting    Elenor Quinones, PharmD, BCPS Clinical Pharmacist Phone number [phone] 12/02/2017 9:17 AM

## 2017-12-02 NOTE — Consult Note (Signed)
Cardiology Consultation:   Patient ID: Terry [name].; [id number]; 1934-09-19   Admit date: 11/29/2017 Date of Consult: 12/02/2017  Primary Care Provider: [name], DO Primary Cardiologist: Dr. Julianne Handler Primary Electrophysiologist:  None   Patient Profile:   Terry [name]. is a 83 y.o. male with a PMH of CAD s/p multiple PCIs (last 08/2017 with DES to RCA), HTN, HLD, PAF not on AC, moderate AS, CVA, COPD on home O2, ESRD on HD M/W/F, who is being seen today for the evaluation of aortic stenosis at the request of the patient and his family.  History of Present Illness:   Terry [name] has had multiple admissions this month for acute on chronic respiratory failure. He was admitted from 11/22/17-11/27/17, diagnosed with parainfluenza and COPD exacerbation, managed with steroids and nebulizers. He continued to feel poorly and was readmitted 11/29/17 with acute on chronic respiratory failure with hypoxia which was thought to be multifactorial including COPD exacerbation, acute on chronic diastolic CHF, incomplete recovery from parainfluenza infection, and possible aspiration. He has been on zosyn, nebulizers, and steroids this admission and reports improvement in his breathing.   From a cardiac standpoint he has has been doing okay. He is without anginal complaints or syncope. His last echocardiogram at [facility] 10/25/17 revealed baseline mod-severe aortic stenosis with average resting gradient of 31 mmHg peak and mean of 20 mmHg. Peak stress aortic valve with mean PG 32 mmHg with some variation due to atrial fibrillation. He is followed outpatient by Dr. Julianne Handler who last saw him 11/08/17 and recommended routine monitoring of his AS given lack of symptoms. He was recommended for repeat echo 02/2018. His last ischemic evaluation was a LHC 08/2017 at [facility] where he underwent rotational atherectomy of the ostial and mid RCA and DES to both sites; he was also noted to have 50% pLAD stenosis and 40% circumflex  stenosis.   His wife at bedside states he is slowly improving this admission. She wanted to include cardiology in his care to make sure everything was stable given his aortic stenosis and recent LHC. The patient feels his breathing is improving. He has LE edema. He also reports constipation.   Past Medical History:  Diagnosis Date  . Acute and chronic respiratory failure 10/22/2012  . Anxiety   . Arthritis    "hands, knees" (03/15/2017)  . Atrial fibrillation ([place])   . CAD (coronary artery disease) [place], [name]  . CHF (congestive heart failure) ([place]) 09/2012  . COPD (chronic obstructive pulmonary disease) ([place])   . ESRD (end stage renal disease) on dialysis [facility])    "MWF; [facility], [place]" (03/15/2017)  . Flu 09/24/2016   influenza b  . HCAP (healthcare-associated pneumonia) 03/15/2017  . High cholesterol   . History of blood transfusion 2012   "related to bowel resection"  . Hypertension   . On home oxygen therapy    "1L typically at night" (03/15/2017)  . Osteoarthritis 10/22/2012  . Pneumonia April 2014; 09/2016  . Shortness of breath dyspnea   . Stroke [facility]) 2012   denies residual on 03/15/2017  . Stroke [facility]) ~ 2013   "small one in his right eye"    Past Surgical History:  Procedure Laterality Date  . APPENDECTOMY  1984  . AV FISTULA PLACEMENT Right 05/21/2015   Procedure: Right Arm Brachiocephalic ARTERIOVENOUS (AV) FISTULA CREATION;  Surgeon: Angelia Mould, MD;  Location: [place];  Service: Vascular;  Laterality: Right;  . CATARACT EXTRACTION  W/ INTRAOCULAR LENS  IMPLANT, BILATERAL Bilateral   . COLECTOMY  2012   partial colon removed - twisted bowel  . CORONARY ANGIOPLASTY WITH STENT PLACEMENT  1999   2 stents  . CYSTOSCOPY/RETROGRADE/URETEROSCOPY Bilateral 04/23/2014   Procedure: CYSTOSCOPY BILATERAL RETROGRADE,  LEFT URETEROSCOPY, LEFT STENT PLACEMENT;  Surgeon: Raynelle Bring, MD;  Location: [facility];  Service: Urology;  Laterality: Bilateral;    . EYE SURGERY Left    "surgery to clean off lens after cataract OR"  . INGUINAL HERNIA REPAIR Right      Home Medications:  Prior to Admission medications   Medication Sig Start Date End Date Taking? Authorizing Provider  acetaminophen (TYLENOL) 500 MG tablet Take 1,000 mg by mouth as needed for mild pain or headache.   Yes [provider]  albuterol (PROVENTIL HFA;VENTOLIN HFA) 108 (90 Base) MCG/ACT inhaler Inhale 2 puffs into the lungs every 4 (four) hours as needed for wheezing or shortness of breath. 11/21/17  Yes Orlie Dakin, MD  albuterol (PROVENTIL) (2.5 MG/3ML) 0.083% nebulizer solution Take 3 mLs (2.5 mg total) by nebulization every 4 (four) hours as needed for wheezing or shortness of breath. 11/21/17  Yes Orlie Dakin, MD  aspirin EC 81 MG tablet Take 81 mg by mouth daily.   Yes [provider]  atorvastatin (LIPITOR) 80 MG tablet Take 1 tablet (80 mg total) by mouth daily. 11/10/17  Yes Burnell Blanks, MD  budesonide-formoterol [facility]) 160-4.5 MCG/ACT inhaler Inhale 2 puffs into the lungs 2 (two) times daily.   Yes [provider]  calcium acetate (PHOSLO) 667 MG capsule Take 1,334 mg by mouth 3 (three) times daily with meals.  01/09/16  Yes [provider]  cetirizine (ZYRTEC) 10 MG tablet Take 10 mg by mouth as needed for allergies.   Yes [provider]  clopidogrel (PLAVIX) 75 MG tablet Take 1 tablet (75 mg total) by mouth daily. 11/10/17  Yes Burnell Blanks, MD  dextromethorphan-guaiFENesin [facility] DM) 30-600 MG 12hr tablet Take 1 tablet by mouth 2 (two) times daily as needed for cough.   Yes [provider]  ethyl chloride spray Apply 1 application topically See admin instructions. Spray at dialysis on Mon/Wed/Fri as directed 05/28/17  Yes [provider]  guaiFENesin (MUCINEX) 600 MG 12 hr tablet Take 600 mg by mouth 2 (two) times daily as needed for to loosen phlegm.   Yes [provider]  HYDROcodone-homatropine (HYCODAN) 5-1.5 MG/5ML syrup Take 5 mLs by mouth every 4 (four) hours as needed for cough. 11/27/17  Yes Tat, Shanon Brow, MD  methocarbamol (ROBAXIN) 500 MG tablet Take 500 mg by mouth 2 (two) times daily as needed for muscle spasms.   Yes [provider]  metoprolol succinate (TOPROL-XL) 50 MG 24 hr tablet Take 50 mg by mouth 2 (two) times daily. Take with or immediately following a meal.    Yes [provider]  midodrine (PROAMATINE) 10 MG tablet Take 10 mg by mouth See admin instructions. Take 10 mg by mouth on Mon/Wed/Fri at dialysis as needed for low blood pressure   Yes [provider]  OXYGEN Inhale 1-2 L into the lungs daily as needed (for shortness of breath).    Yes [provider]  oxymetazoline (AFRIN) 0.05 % nasal spray Place 1 spray into both nostrils once as needed (to stop nose bleeds).    Yes [provider]  predniSONE (DELTASONE) 10 MG tablet Take 6 tablets (60 mg total) by mouth daily with breakfast.  And decrease by one tablet daily 11/28/17  Yes Tat, David, MD  Glycopyrrolate-Formoterol (BEVESPI AEROSPHERE) 9-4.8 MCG/ACT AERO Inhale 2 puffs into the lungs 2 (two) times daily. Patient not taking: Reported on 11/29/2017 11/18/17   Tanda Rockers, MD  predniSONE (DELTASONE) 5 MG tablet Take 2 tablets (10 mg total) by mouth daily with breakfast. 11/21/17   Orlie Dakin, MD    Inpatient Medications: Scheduled Meds: . aspirin EC  81 mg Oral Daily  . atorvastatin  80 mg Oral Daily  . calcium acetate  1,334 mg Oral TID WC  . Chlorhexidine Gluconate Cloth  6 each Topical Q0600  . Chlorhexidine Gluconate Cloth  6 each Topical Q0600  . clopidogrel  75 mg Oral Daily  . darbepoetin (ARANESP) injection - DIALYSIS  150 mcg Intravenous Q Wed-HD  . doxercalciferol  3 mcg Intravenous Q M,W,F-HD  . famotidine  20 mg Oral Daily  . heparin  5,000 Units Subcutaneous Q8H  . ipratropium  0.5 mg Nebulization Q6H  .  levalbuterol  1.25 mg Nebulization Q6H  . loratadine  10 mg Oral Daily  . methylPREDNISolone (SOLU-MEDROL) injection  60 mg Intravenous Q6H  . metoprolol succinate  50 mg Oral BID  . [START ON 12/03/2017] midodrine  10 mg Oral Q M,W,F-HD  . mometasone-formoterol  2 puff Inhalation BID  . multivitamin  1 tablet Oral QHS   Continuous Infusions: . sodium chloride    . sodium chloride    . [START ON 12/08/2017] ferric gluconate (FERRLECIT/NULECIT) IV Stopped (12/01/17 2040)  . piperacillin-tazobactam (ZOSYN)  IV Stopped (12/02/17 1251)   PRN Meds: sodium chloride, sodium chloride, acetaminophen, alteplase, guaiFENesin, heparin, hydrALAZINE, HYDROcodone-homatropine, ipratropium-albuterol, lidocaine (PF), lidocaine-prilocaine, midodrine, ondansetron (ZOFRAN) IV, oxymetazoline, pentafluoroprop-tetrafluoroeth, polyethylene glycol  Allergies:    Allergies  Allergen Reactions  . Yellow Dyes (Non-Tartrazine) Other (See Comments)    Burns arms; Cough medications (tussionex and benzonatate ok)  . Levaquin [Levofloxacin In D5w] Itching  . Levofloxacin Nausea And Vomiting    Social History:   Social History   Socioeconomic History  . Marital status: Married    Spouse name: Not on file  . Number of children: 3  . Years of education: Not on file  . Highest education level: Not on file  Occupational History  . Occupation: retired-worked in Writer: RETIRED  Social Needs  . Financial resource strain: Not on file  . Food insecurity:    Worry: Not on file    Inability: Not on file  . Transportation needs:    Medical: Not on file    Non-medical: Not on file  Tobacco Use  . Smoking status: Former Smoker    Packs/day: 1.00    Years: 49.00    Pack years: 49.00    Types: Cigarettes, Pipe, Cigars    Last attempt to quit: 02/21/2000    Years since quitting: 17.7  . Smokeless tobacco: Former Systems developer    Types: Chew  . Tobacco comment: "chewed when he was a kid"  Substance and Sexual  Activity  . Alcohol use: Yes    Comment: 03/15/2017 "nothing since the 1960's"  . Drug use: No  . Sexual activity: Never  Lifestyle  . Physical activity:    Days per week: Not on file    Minutes per session: Not on file  . Stress: Not on file  Relationships  . Social connections:    Talks on phone: Not on file    Gets together: Not on file  Attends religious service: Not on file    Active member of club or organization: Not on file    Attends meetings of clubs or organizations: Not on file    Relationship status: Not on file  . Intimate partner violence:    Fear of current or ex partner: Not on file    Emotionally abused: Not on file    Physically abused: Not on file    Forced sexual activity: Not on file  Other Topics Concern  . Not on file  Social History Narrative   Work or School: retired [place] Situation: lives with wife in [place] Beliefs: [place] regular; poor diet          Family History:    Family History  Problem Relation Age of Onset  . Cancer Mother        Breast  . Heart disease Father   . Heart attack Father   . Parkinson's disease Brother      ROS:  Please see the history of present illness.   All other ROS reviewed and negative.     Physical Exam/Data:   Vitals:   12/02/17 0834 12/02/17 0928 12/02/17 0933 12/02/17 1414  BP: (!) 127/43     Pulse: 97 (!) 106  (!) 102  Resp: 15 16  18   Temp: 98.9 F (37.2 C)     TempSrc: Oral     SpO2: 96% 100% 100% 97%  Weight:      Height:        Intake/Output Summary (Last 24 hours) at 12/02/2017 1417 Last data filed at 12/02/2017 0800 Gross per 24 hour  Intake 506 ml  Output 2000 ml  Net -1494 ml   Filed Weights   11/29/17 1806 12/01/17 1545 12/01/17 1945  Weight: 148 lb (67.1 kg) 149 lb 7.6 oz (67.8 kg) 144 lb 13.5 oz (65.7 kg)   Body mass index is 22.02 kg/m.  General:  Chronically ill appearing elderly gentleman, sitting on the  edge of the hospital be in NAD HEENT: sclera anicteric  Neck: no JVD Vascular: No carotid bruits; distal pulses 2+ bilaterally Cardiac:  normal S1, S2; RRR; +murmur, no gallops or rubs Lungs:  clear to auscultation bilaterally, no wheezing, rhonchi or rales  Abd: NABS, soft, nontender, no hepatomegaly Ext: 2-3+ LE edema; feet are cool to touch; sensation intact  Musculoskeletal:  No deformities, BUE and BLE strength normal and equal Skin: multiple areas of ecchymosis throughout  Neuro:  CNs 2-12 intact, no focal abnormalities noted Psych:  Normal affect   EKG:  The EKG was personally reviewed and demonstrates:  Atrial fibrillation with RVR with PVC Telemetry:  Telemetry was personally reviewed and demonstrates:  Atrial fibrillation with rate 90s-110s.   Relevant CV Studies: Stress Echo 10/25/17: 1. Resting left ventricular EF 49% 2. There is moderate aortic valve calcification. 3. There is baseline moderate-to-severe aortic stenosis with average resting gradient of 31 mmHg peak and mean of 20 mmHg with beat to beat variation in afib and a calculated valve area of 0.8cm2-1.0cm2 (LVOT diam 2.0cm). 4. Peak stress Aortic valve (20 mcg/kg/min): average from several beats with variation in afib mean PG 32 mmHg, Vmax 3.63, AVA (VTI) 0.82 cm2-- the highest single beat had a peak of 69 mmHg and a mean of 35 mmHg 5. There is no evidence of inducible ischemia based on echo, ECG, and symptoms. Overall  study was very difficult with stroke volume variation in afib  Laboratory Data:  Chemistry Recent Labs  Lab 11/30/17 2150 12/01/17 1730 12/02/17 1101  NA 137 137 139  K 5.9* 5.7* 4.7  CL 93* 92* 96*  CO2 29 26 30   GLUCOSE 117* 208* 212*  BUN 77* 100* 48*  CREATININE 5.67* 6.89* 4.52*  CALCIUM 8.0* 8.1* 8.2*  GFRNONAA 8* 7* 11*  GFRAA 10* 8* 13*  ANIONGAP 15 19* 13    Recent Labs  Lab 11/26/17 0610 12/01/17 1730  ALBUMIN 3.3* 2.6*   Hematology Recent Labs  Lab 11/26/17 0610  11/29/17 1910 12/01/17 0257  WBC 15.1* 10.4 15.9*  RBC 3.17* 3.60* 3.38*  HGB 9.8* 11.1* 10.3*  HCT 32.6* 36.2* 33.9*  MCV 102.8* 100.6* 100.3*  MCH 30.9 30.8 30.5  MCHC 30.1 30.7 30.4  RDW 18.3* 18.5* 18.4*  PLT 297 270 247   Cardiac EnzymesNo results for input(s): TROPONINI in the last 168 hours.  Recent Labs  Lab 11/29/17 1920  TROPIPOC 0.02    BNP Recent Labs  Lab 11/29/17 1910  BNP 565.3*    DDimer No results for input(s): DDIMER in the last 168 hours.  Radiology/Studies:  Dg Chest 2 View  Result Date: 11/29/2017 CLINICAL DATA:  Dyspnea x1 day. EXAM: CHEST - 2 VIEW COMPARISON:  11/22/2017 CXR, chest CT 07/16/2017 FINDINGS: Emphysematous hyperinflation of the lungs. Pulmonary opacities at the right lung base consistent with atelectasis and/or pneumonia. Follow-up to clearance recommended. Subsegmental atelectasis noted at the left lung base. Heart is within normal limits for size. There is aortic atherosclerosis. Rotator cuff calcific tendinopathy is seen bilaterally. IMPRESSION: Emphysematous lungs with right basilar opacity suspicious for pneumonia and/or atelectasis. Followup PA and lateral chest X-ray is recommended in 3-4 weeks following trial of antibiotic therapy to ensure resolution and exclude underlying malignancy. Electronically Signed   By: Ashley Royalty M.D.   On: 11/29/2017 19:00   Dg Foot Complete Left  Result Date: 11/29/2017 CLINICAL DATA:  Left foot and toe pain. EXAM: LEFT FOOT - COMPLETE 3+ VIEW COMPARISON:  Foot radiograph 09/28/2017 FINDINGS: No fracture or dislocation. Unchanged alignment from prior exam. Minimal osteoarthritis at the great toe metatarsal phalangeal joint and throughout the digits. No bony destruction, osseous erosions or periosteal reaction. Soft tissue edema most prominent about the dorsum of the foot. No radiopaque foreign body. There are vascular calcifications. IMPRESSION: Dorsal soft tissue edema. Mild osteoarthritis without acute  osseous abnormality. Electronically Signed   By: Jeb Levering M.D.   On: 11/29/2017 23:42    Assessment and Plan:   1. Aortic Stenosis: appears mod-severe on stress echo 10/25/17 from [facility] Patient is currently admitted for acute on chronic respiratory failure thought to be multifactorial in the setting of COPD/CHF exacerbation, parainfluenza virus, and possible aspiration PNA. Prior to these string of admissions, he was without SOB complaints. He denies chest pain, pre-syncope, or syncope.  - Would continue routine monitoring outpatient - Dr. Julianne Handler recommended repeat echo 02/2018  2. CAD s/p multiple PCI: No complaints of chest pain in recent weeks. His last ischemic evaluation was a LHC 08/2017 at [facility] where he underwent rotational atherectomy of the ostial and mid RCA and DES to both sites; he was also noted to have 50% pLAD stenosis and 40% circumflex stenosis.  - Continue ASA, plavix, statin, and metoprolol  3. Atrial fibrillation: HR 90s-110s over the past 24 hours and consistently >100 this admission. Suspect RVR is being driving by acute infection. He is  without complaints of palpitations or tachycardia. He is not on anticoagulation due to GI bleeding with coumadin and ESRD limiting DOAC use.  - CHA2DS2-VASc Score 7 (CHF, HTN, MI, Age >75, CVA history), unfortunately despite his high risk of stroke, he [place] unable to be on anticoagulation.   - Continue metoprolol  4. Acute on chronic combined CHF: possible this was contributing to his SOB at presentation. He is net negative 1.7L in the last 24 hours and -766mL this admission. Weight 149lb>144lbs. Volume being managed at dialysis per nephrology. Still with some LE edema on exam, although clear lungs - Continue volume management per nephrology - Compression stockings to assist with LE edema  5. Acute on chronic respiratory failure: multifactorial in the setting of COPD/CHF exacerbation, parainfluenza virus, and possible aspiration PNA. He  is currently receiving IV antibiotics, IV steroids, and nebulizer treatments.  - Continue management per primary team.  6. Constipation: patient reported straining with BMs and constipation - Ordered miralax daily (hold for loose stools)  For questions or updates, please contact [place] Please consult [email or web address] for contact info under Cardiology/STEMI.   Signed, Abigail Butts, PA-C  12/02/2017 2:17 PM [phone]

## 2017-12-03 LAB — BASIC METABOLIC PANEL
ANION GAP: 13 (ref 5–15)
ANION GAP: 14 (ref 5–15)
BUN: 48 mg/dL — ABNORMAL HIGH (ref 6–20)
BUN: 73 mg/dL — ABNORMAL HIGH (ref 6–20)
CALCIUM: 8.2 mg/dL — AB (ref 8.9–10.3)
CALCIUM: 8.1 mg/dL — AB (ref 8.9–10.3)
CHLORIDE: 95 mmol/L — AB (ref 101–111)
CO2: 30 mmol/L (ref 22–32)
CO2: 25 mmol/L (ref 22–32)
CREATININE: 4.52 mg/dL — AB (ref 0.61–1.24)
Chloride: 96 mmol/L — ABNORMAL LOW (ref 101–111)
Creatinine, Ser: 5.92 mg/dL — ABNORMAL HIGH (ref 0.61–1.24)
GFR calc non Af Amer: 8 mL/min — ABNORMAL LOW (ref >60)
GFR, EST AFRICAN AMERICAN: 13 mL/min — AB (ref >60)
GFR, EST AFRICAN AMERICAN: 9 mL/min — AB (ref >60)
GFR, EST NON AFRICAN AMERICAN: 11 mL/min — AB (ref >60)
GLUCOSE: 178 mg/dL — AB (ref 65–99)
Glucose, Bld: 212 mg/dL — ABNORMAL HIGH (ref 65–99)
POTASSIUM: 5 mmol/L (ref 3.5–5.1)
Potassium: 4.7 mmol/L (ref 3.5–5.1)
SODIUM: 139 mmol/L (ref 135–145)
Sodium: 134 mmol/L — ABNORMAL LOW (ref 135–145)

## 2017-12-03 LAB — HEPATITIS B SURFACE ANTIGEN: Hepatitis B Surface Ag: NEGATIVE

## 2017-12-03 MED ORDER — FAMOTIDINE 20 MG PO TABS: 20.0000 mg | ORAL_TABLET | Freq: Every day | ORAL | Status: DC

## 2017-12-03 MED ORDER — DOXERCALCIFEROL 4 MCG/2ML IV SOLN
INTRAVENOUS | Status: AC
  Administered 2017-12-03: 3 ug via INTRAVENOUS
  Filled 2017-12-03: qty 2

## 2017-12-03 NOTE — Progress Notes (Signed)
Subjective: Interval History: has complaints wants to get out of here..  Objective: Vital signs in last 24 hours: Temp:  [97.7 F (36.5 C)-98.4 F (36.9 C)] 97.7 F (36.5 C) (06/14 0740) Pulse Rate:  [73-108] 95 (06/14 0800) Resp:  [15-25] 18 (06/14 0800) BP: (117-134)/(54-82) 128/66 (06/14 0800) SpO2:  [85 %-100 %] 96 % (06/14 0740) FiO2 (%):  [32 %] 32 % (06/13 0933) Weight:  [68.3 kg (150 lb 9.2 oz)] 68.3 kg (150 lb 9.2 oz) (06/14 0740) Weight change:   Intake/Output from previous day: 06/13 0701 - 06/14 0700 In: 236 [P.O.:236] Out: -  Intake/Output this shift: No intake/output data recorded.  General appearance: alert, cooperative, no distress and pale Resp: diminished breath sounds bilaterally Cardio: irregularly irregular rhythm GI: mild distension, pos bs, liver down 3 cm Extremities: avf RUA  Lab Results: Recent Labs    12/01/17 0257  WBC 15.9*  HGB 10.3*  HCT 33.9*  PLT 247   BMET:  Recent Labs    12/01/17 1730 12/02/17 1101  NA 137 139  K 5.7* 4.7  CL 92* 96*  CO2 26 30  GLUCOSE 208* 212*  BUN 100* 48*  CREATININE 6.89* 4.52*  CALCIUM 8.1* 8.2*   No results for input(s): PTH in the last 72 hours. Iron Studies: No results for input(s): IRON, TIBC, TRANSFERRIN, FERRITIN in the last 72 hours.  Studies/Results: No results found.  I have reviewed the patient's current medications.  Assessment/Plan: 1 ESRD for HD.  Stable 2 Anemia esa 3 HPTH 4 Severe COPD primary issue 5 ? Pneu on AB 6 AS P HD, esa, AB, O2    LOS: 4 days   Jeneen Rinks Barclay Lennox 12/03/2017,8:41 AM

## 2017-12-03 NOTE — Progress Notes (Signed)
Pt was seen for evaluation of mobility and tolerance of exercise.  He demonstrated a loss of O2 sat from 92 to 89% with walk of 30' with no supplemental O2.  He has also incresed his pulse to 120 briefly with the effort.  Follow acutely for monitoring of sats and pulses with activiity and progress gait and transfers as tolerated.     12/03/17 1700  PT Visit Information  Last PT Received On 12/03/17  Assistance Needed +1  History of Present Illness 83 yo male with onset of SOB and with recent flu was admitted, noted COPD exacerbation and CHF in acute stage, possibly PNA as well.  Has been having hiccups but are controlled.  PMHx:  COPD, chronic respiratory failure on PRN O2, stent, HTN, CHF, stroke, a-fib, CAD, anemia, EF 45%, HD.  Subjective Data  Patient Stated Goal To walk and go home ASAP   Precautions  Precautions Fall (telemetry)  Precaution Comments ck O2 sats and pulses with mobility  Restrictions  Weight Bearing Restrictions No  Pain Assessment  Pain Assessment No/denies pain  Cognition  Arousal/Alertness Awake/alert  Behavior During Therapy Agitated  Overall Cognitive Status Within Functional Limits for tasks assessed  General Comments irritable at times but seems from poor health  Bed Mobility  Overal bed mobility Modified Independent  Transfers  Overall transfer level Needs assistance  Equipment used Rolling walker (2 wheeled)  Transfers Sit to/from Stand  Sit to Stand Min guard  Ambulation/Gait  Ambulation/Gait assistance Supervision;Min guard  Gait Distance (Feet) 60 Feet (30 x 2)  Assistive device Rolling walker (2 wheeled)  Gait Pattern/deviations Step-through pattern;Step-to pattern;Decreased stride length;Wide base of support;Trunk flexed  General Gait Details short trips due to fatigue with effort, second trip was with no supplemental O2  Gait velocity reduced  Gait velocity interpretation <1.31 ft/sec, indicative of household ambulator  Balance  Overall  balance assessment Needs assistance  Sitting-balance support Feet supported  Sitting balance-Leahy Scale Good  Standing balance support Bilateral upper extremity supported;During functional activity  Standing balance-Leahy Scale Fair  Standing balance comment less than fair dynamic balance  General Comments  General comments (skin integrity, edema, etc.) fragile skin with bruises and open areas  Exercises  Exercises Other exercises (LE strength was reduced to 4 to 4- overall  )  PT - End of Session  Equipment Utilized During Treatment Gait belt;Oxygen  Activity Tolerance Patient tolerated treatment well;Patient limited by fatigue;Treatment limited secondary to medical complications (Comment)  Patient left in bed;with call bell/phone within reach  Nurse Communication Mobility status;Other (comment) (O2 sats with no cannula and pulses with effort)   PT - Assessment/Plan  PT Visit Diagnosis Unsteadiness on feet (R26.81);Muscle weakness (generalized) (M62.81);Difficulty in walking, not elsewhere classified (R26.2);Adult, failure to thrive (R62.7)  PT Frequency (ACUTE ONLY) Min 3X/week  Follow Up Recommendations Home health PT;Supervision for mobility/OOB  PT equipment None recommended by PT (owns all AD's)  AM-PAC PT "6 Clicks" Daily Activity Outcome Measure  Difficulty turning over in bed (including adjusting bedclothes, sheets and blankets)? 3  Difficulty moving from lying on back to sitting on the side of the bed?  3  Difficulty sitting down on and standing up from a chair with arms (e.g., wheelchair, bedside commode, etc,.)? 1  Help needed moving to and from a bed to chair (including a wheelchair)? 3  Help needed walking in hospital room? 3  Help needed climbing 3-5 steps with a railing?  2  6 Click Score 15  Mobility G  Code  CK  Acute Rehab PT Goals  PT Goal Formulation With patient  Time For Goal Achievement 12/10/17  Potential to Achieve Goals Good  PT Time Calculation  PT  Start Time (ACUTE ONLY) 1635  PT Stop Time (ACUTE ONLY) 1710  PT Time Calculation (min) (ACUTE ONLY) 35 min  PT G-Codes **NOT FOR INPATIENT CLASS**  Functional Assessment Tool Used AM-PAC 6 Clicks Basic Mobility  PT General Charges  $$ ACUTE PT VISIT 1 Visit  PT Evaluation  $PT Eval Moderate Complexity 1 Mod  PT Treatments  $Gait Training 8-22 mins    Mee Hives, PT MS Acute Rehab Dept. Number: [place]

## 2017-12-03 NOTE — Progress Notes (Signed)
[facility]  PROGRESS NOTE  Terry [name]. ZOX:[id number] DOB: 11-20-1934 DOA: 11/29/2017 PCP: [name], DO   Brief HPI:   83 y.o.malewith medical history significant ofCOPD, chronic respiratory failure on 2-3 L nasal cannula oxygen as needed, hypertension, hyperlipidemia, stroke, atrial fibrillation not on anticoagulants due to GI bleeding, CAD,s/p ofstent placement, CHFwith EF 45%, ESHD-HD (MWF), aortic stenosis, who presents with shortness of breath. Patient was recently hospitalized from 6/3-6/8 due to COPD exacerbation2/2parainfluenza. He is wearing2-3 LofO2prnat home, nowrequiring3 L nasal cannula oxygen in ED.     Subjective   Patient seen and examined, breathing is improved.  Hiccups better after starting on Pepcid.   Assessment/Plan:     1. Acute on chronic respiratory failure with hypoxia.-Slowly improving, multifactorial, patient has underlying COPD volume overload in setting of end-stage renal disease, concern for aspiration.  Chest x-ray shows right basilar opacity suspicious for pneumonia,.Patient currently started on IV Zosyn.   Solu-Medrol 60 mg every 6 hours Xopenex nebulizer every 6 hours, ipratropium nebulizer every 6 hours,  2. ESRD-  nephrology has been consulted patient get dialyzed on Monday Wednesday and Friday.  Hemodialysis today. 3. Acute on chronic combined systolic and diastolic CHF-stable, do not feel that patient has significant pulmonary edema.  Lungs are clear to auscultation. 4. Aortic stenosis-patient has moderate aortic stenosis as per echo from March 2019, family requesting cardiology consultation.  [place] cardiology consultation, no plans for acute intervention at this time. 5.   Hypertension-continue metoprolol 6. CAD status post DES stent-continue aspirin, Plavix, Lipitor 7. Atrial fibrillation- CHA2DS2VASc score is 5, patient is not on anticoagulation due to history of GI bleed.  Continue metoprolol for rate  control. 8. Anemia of chronic disease.  Hemoglobin is stable.- 9. Hiccups/muscle spasm-patient was started on baclofen which was discontinued by nephrology.  Hiccups have improved with Pepcid.  Will change Pepcid to 20 mg twice a day.   DVT prophylaxis: Heparin  Code Status: Full code  Family Communication: No family at bedside  Disposition Plan: likely home when medically ready for discharge   Consultants:  Nephrology  Procedures:  None*   Antibiotics:   Anti-infectives (From admission, onward)   Start     Dose/Rate Route Frequency Ordered Stop   12/01/17 1200  vancomycin (VANCOCIN) IVPB 750 mg/150 ml premix  Status:  Discontinued     750 mg 150 mL/hr over 60 Minutes Intravenous Every M-W-F (Hemodialysis) 11/29/17 1948 11/29/17 2205   11/29/17 2100  piperacillin-tazobactam (ZOSYN) IVPB 3.375 g     3.375 g 12.5 mL/hr over 240 Minutes Intravenous Every 12 hours 11/29/17 1948     11/29/17 1945  vancomycin (VANCOCIN) 1,500 mg in sodium chloride 0.9 % 500 mL IVPB     1,500 mg 250 mL/hr over 120 Minutes Intravenous  Once 11/29/17 1948 11/29/17 2340       Objective   Vitals:   12/03/17 1130 12/03/17 1200 12/03/17 1223 12/03/17 1329  BP: (!) 89/53 (!) 97/46 (!) 110/56   Pulse: 98 (!) 104 (!) 113 (!) 105  Resp: 18 18 16 18   Temp:  98.1 F (36.7 C) 98.1 F (36.7 C)   TempSrc:  Oral Oral   SpO2:  96%  95%  Weight:  66.2 kg (145 lb 15.1 oz)    Height:        Intake/Output Summary (Last 24 hours) at 12/03/2017 1506 Last data filed at 12/03/2017 1309 Gross per 24 hour  Intake 240 ml  Output 2087 ml  Net -1847  ml   Filed Weights   12/01/17 1945 12/03/17 0740 12/03/17 1200  Weight: 65.7 kg (144 lb 13.5 oz) 68.3 kg (150 lb 9.2 oz) 66.2 kg (145 lb 15.1 oz)     Physical Examination:  Physical Exam:  Physical Exam:  Mouth: Oral mucosa is moist, no lesions on palate,  Neck: Supple, no deformities, masses, or tenderness Lungs: Normal respiratory effort, decreased  breath sounds bilaterally Heart: Regular rate and rhythm, S1 and S2 normal Abdomen: BS normoactive,soft,nondistended,non-tender to palpation,no organomegaly Extremities: No pretibial edema, no erythema, no cyanosis, no clubbing Neuro : Alert and oriented to time, place and person, No focal deficits      Data Reviewed: I have personally reviewed following labs and imaging studies  CBG: No results for input(s): GLUCAP in the last 168 hours.  CBC: Recent Labs  Lab 11/29/17 1910 12/01/17 0257  WBC 10.4 15.9*  NEUTROABS 9.5*  --   HGB 11.1* 10.3*  HCT 36.2* 33.9*  MCV 100.6* 100.3*  PLT 270 202    Basic Metabolic Panel: Recent Labs  Lab 11/29/17 1910 11/30/17 2150 12/01/17 1730 12/02/17 1101 12/03/17 0815  NA 138 137 137 139 134*  K 4.0 5.9* 5.7* 4.7 5.0  CL 93* 93* 92* 96* 95*  CO2 31 29 26 30 25   GLUCOSE 310* 117* 208* 212* 178*  BUN 40* 77* 100* 48* 73*  CREATININE 3.61* 5.67* 6.89* 4.52* 5.92*  CALCIUM 7.7* 8.0* 8.1* 8.2* 8.1*  PHOS  --   --  8.1*  --   --     Recent Results (from the past 240 hour(s))  Respiratory Panel by PCR     Status: Abnormal   Collection Time: 11/24/17 11:40 AM  Result Value Ref Range Status   Adenovirus NOT DETECTED NOT DETECTED Final   Coronavirus 229E NOT DETECTED NOT DETECTED Final   Coronavirus HKU1 NOT DETECTED NOT DETECTED Final   Coronavirus NL63 NOT DETECTED NOT DETECTED Final   Coronavirus OC43 NOT DETECTED NOT DETECTED Final   Metapneumovirus NOT DETECTED NOT DETECTED Final   Rhinovirus / Enterovirus NOT DETECTED NOT DETECTED Final   Influenza A NOT DETECTED NOT DETECTED Final   Influenza B NOT DETECTED NOT DETECTED Final   Parainfluenza Virus 1 NOT DETECTED NOT DETECTED Final   Parainfluenza Virus 2 NOT DETECTED NOT DETECTED Final   Parainfluenza Virus 3 DETECTED (A) NOT DETECTED Final   Parainfluenza Virus 4 NOT DETECTED NOT DETECTED Final   Respiratory Syncytial Virus NOT DETECTED NOT DETECTED Final   Bordetella  pertussis NOT DETECTED NOT DETECTED Final   Chlamydophila pneumoniae NOT DETECTED NOT DETECTED Final   Mycoplasma pneumoniae NOT DETECTED NOT DETECTED Final  Culture, blood (routine x 2) Call MD if unable to obtain prior to antibiotics being given     Status: None (Preliminary result)   Collection Time: 11/30/17  6:13 AM  Result Value Ref Range Status   Specimen Description BLOOD SITE NOT SPECIFIED  Final   Special Requests   Final    BOTTLES DRAWN AEROBIC AND ANAEROBIC Blood Culture adequate volume   Culture   Final    NO GROWTH 3 DAYS Performed at [facility], [name] [place], [place] [address]    Report Status PENDING  Incomplete  Culture, blood (routine x 2) Call MD if unable to obtain prior to antibiotics being given     Status: None (Preliminary result)   Collection Time: 11/30/17  6:13 AM  Result Value Ref Range Status  Specimen Description BLOOD SITE NOT SPECIFIED  Final   Special Requests   Final    BOTTLES DRAWN AEROBIC AND ANAEROBIC Blood Culture adequate volume   Culture   Final    NO GROWTH 3 DAYS Performed at [facility], [address], [place], [place] [address]    Report Status PENDING  Incomplete  Culture, sputum-assessment     Status: None   Collection Time: 12/01/17  8:45 PM  Result Value Ref Range Status   Specimen Description EXPECTORATED SPUTUM  Final   Special Requests NONE  Final   Sputum evaluation   Final    THIS SPECIMEN IS ACCEPTABLE FOR SPUTUM CULTURE Performed at [facility], [address] [place], [place] [address]    Report Status 12/01/2017 FINAL  Final  Culture, respiratory (NON-Expectorated)     Status: None (Preliminary result)   Collection Time: 12/01/17  8:45 PM  Result Value Ref Range Status   Specimen Description EXPECTORATED SPUTUM  Final   Special Requests NONE Reflexed from [id number]  Final   Gram Stain   Final    ABUNDANT WBC PRESENT, PREDOMINANTLY PMN FEW GRAM NEGATIVE RODS MODERATE YEAST     Culture   Final    FEW KLEBSIELLA PNEUMONIAE SUSCEPTIBILITIES TO FOLLOW Performed at [facility], [place] [address], [place], [place] [address]    Report Status PENDING  Incomplete     Liver Function Tests: Recent Labs  Lab 12/01/17 1730  ALBUMIN 2.6*   No results for input(s): LIPASE, AMYLASE in the last 168 hours. No results for input(s): AMMONIA in the last 168 hours.  Cardiac Enzymes: No results for input(s): CKTOTAL, CKMB, CKMBINDEX, TROPONINI in the last 168 hours. BNP (last 3 results) Recent Labs    11/22/17 2145 11/29/17 1910  BNP 659.0* 565.3*    ProBNP (last 3 results) No results for input(s): PROBNP in the last 8760 hours.    Studies: No results found.  Scheduled Meds: . aspirin EC  81 mg Oral Daily  . atorvastatin  80 mg Oral Daily  . calcium acetate  1,334 mg Oral TID WC  . Chlorhexidine Gluconate Cloth  6 each Topical Q0600  . Chlorhexidine Gluconate Cloth  6 each Topical Q0600  . clopidogrel  75 mg Oral Daily  . darbepoetin (ARANESP) injection - DIALYSIS  150 mcg Intravenous Q Wed-HD  . doxercalciferol  3 mcg Intravenous Q M,W,F-HD  . famotidine  20 mg Oral Daily  . heparin  5,000 Units Subcutaneous Q8H  . ipratropium  0.5 mg Nebulization TID  . levalbuterol  1.25 mg Nebulization TID  . loratadine  10 mg Oral Daily  . methylPREDNISolone (SOLU-MEDROL) injection  60 mg Intravenous Q6H  . metoprolol succinate  50 mg Oral BID  . midodrine  10 mg Oral Q M,W,F-HD  . mometasone-formoterol  2 puff Inhalation BID  . multivitamin  1 tablet Oral QHS  . polyethylene glycol  17 g Oral Daily      Time spent: 25 min  [facility] Pager [phone]. If 7PM-7AM, please contact night-coverage at [email or web address], Office  [phone]  password [email or web address]  12/03/2017, 3:06 PM  LOS: 4 days

## 2017-12-03 NOTE — Telephone Encounter (Signed)
No note needed/ ok to close

## 2017-12-03 NOTE — Procedures (Signed)
I was present at this session.  I have reviewed the session itself and made appropriate changes.  HD via [facility] avf, access press ok. bp 110-120s. tol well  [place] 6/14/20198:40 AM

## 2017-12-04 DIAGNOSIS — J441 Chronic obstructive pulmonary disease with (acute) exacerbation: Secondary | ICD-10-CM

## 2017-12-04 LAB — CULTURE, RESPIRATORY W GRAM STAIN

## 2017-12-04 LAB — CULTURE, RESPIRATORY

## 2017-12-04 MED ORDER — CEFUROXIME AXETIL 500 MG PO TABS: 500.0000 mg | ORAL_TABLET | Freq: Every day | ORAL | 0 refills | Status: AC

## 2017-12-04 MED ORDER — PREDNISONE 10 MG PO TABS: 60.0000 mg | ORAL_TABLET | Freq: Every day | ORAL | 0 refills | Status: DC

## 2017-12-04 MED ORDER — CEFUROXIME AXETIL 500 MG PO TABS: 500.0000 mg | ORAL_TABLET | Freq: Two times a day (BID) | ORAL | 0 refills | Status: DC

## 2017-12-04 MED ORDER — PREDNISONE 5 MG PO TABS: 10.0000 mg | ORAL_TABLET | Freq: Every day | ORAL | 0 refills | Status: DC

## 2017-12-04 MED ORDER — FAMOTIDINE 20 MG PO TABS: 20.0000 mg | ORAL_TABLET | Freq: Two times a day (BID) | ORAL | 1 refills | Status: DC | PRN

## 2017-12-04 NOTE — Progress Notes (Addendum)
[facility] notified of HR 140,s unsustained  when pt gets up. Pt has been up to [facility] multiple times. At this time pt is asymptomatic. Will continue to monitor.

## 2017-12-04 NOTE — Discharge Summary (Addendum)
Physician Discharge Summary  Terry [name]. RXV:[id number] DOB: 04/02/1935 DOA: 11/29/2017  PCP: Terry Kern, DO  Admit date: 11/29/2017 Discharge date: 12/04/2017  Time spent: 50* minutes  Recommendations for Outpatient Follow-up:  1. Follow up PCP in 2 weeks   Discharge Diagnoses:  Principal Problem:   Acute on chronic respiratory failure with hypoxia (HCC) Active Problems:   Essential hypertension   CAD (coronary artery disease)   Chronic atrial fibrillation (HCC)   Acute on chronic combined systolic and diastolic CHF (congestive heart failure) (HCC)   Anemia of chronic disease   ESRD (end stage renal disease) on dialysis (HCC)   COPD exacerbation (HCC)   Drowsiness   Aortic valve stenosis   Discharge Condition: Stable  Diet recommendation: Heart healthy diet  Filed Weights   12/01/17 1945 12/03/17 0740 12/03/17 1200  Weight: 65.7 kg (144 lb 13.5 oz) 68.3 kg (150 lb 9.2 oz) 66.2 kg (145 lb 15.1 oz)    History of present illness:  83 y.o.malewith medical history significant ofCOPD, chronic respiratory failure on 2-3 L nasal cannula oxygen as needed, hypertension, hyperlipidemia, stroke, atrial fibrillation not on anticoagulants due to GI bleeding, CAD,s/p ofstent placement, CHFwith EF 45%, ESHD-HD (MWF), aortic stenosis, who presents with shortness of breath. Patient was recently hospitalized from 6/3-6/8 due to COPD exacerbation2/2parainfluenza. He is wearing2-3 LofO2prnat home, nowrequiring3 L nasal cannula oxygen in ED.     Hospital Course:   1. Acute on chronic respiratory failure with hypoxia.-Slowly improving, multifactorial, patient has underlying COPD volume overload in setting of end-stage renal disease, concern for aspiration.  Chest x-ray shows right basilar opacity suspicious for pneumonia,.Patient currently started on IV Zosyn, Solu-Medrol 60 mg every 6 hours Xopenex nebulizer every 6 hours, ipratropium nebulizer every 6 hours, sputum  culture grew klebsiella, sensitive to cephalosporins, will discharge home on Po Ceftin 500 mg daily x 3 days. Prednisone taper , then continue with Prednisone 10 mg po daily. 2. ESRD-  nephrology was consulted patient got dialyzed on Monday Wednesday and Friday.  3. Acute on chronic combined systolic and diastolic CHF-stable, do not feel that patient has significant pulmonary edema.  Lungs are clear to auscultation. 4. Aortic stenosis-patient has moderate aortic stenosis as per echo from March 2019, family requesting cardiology consultation.  [facility] cardiology consultation, no plans for acute intervention at this time. 5.   Hypertension-continue metoprolol 6. CAD status post DES stent-continue aspirin, Plavix, Lipitor 7. Atrial fibrillation- CHA2DS2VASc score is 5, patient is not on anticoagulation due to history of GI bleed.  Continue metoprolol for rate control. 8. Anemia of chronic disease.  Hemoglobin is stable.- 9. Hiccups/muscle spasm-patient was started on baclofen which was discontinued by nephrology.  Hiccups have improved with Pepcid.  Will change Pepcid to 20 mg twice a day.    Procedures: None   Consultations:  Nephrology   Discharge Exam: Vitals:   12/04/17 0851 12/04/17 0852  BP: (!) 124/46 (!) 124/46  Pulse: (!) 102 98  Resp: 17   Temp: 98.2 F (36.8 C)   SpO2: 98% 95%    General: Appears in no acute distress Cardiovascular: S1S2 RRR Respiratory: Clear bilaterally  Discharge Instructions    Allergies as of 12/04/2017      Reactions   Yellow Dyes (non-tartrazine) Other (See Comments)   Burns arms; Cough medications (tussionex and benzonatate ok)   Levaquin [levofloxacin In D5w] Itching   Levofloxacin Nausea And Vomiting      Medication List    STOP taking these medications  guaiFENesin 600 MG 12 hr tablet Commonly known as:  MUCINEX     TAKE these medications   acetaminophen 500 MG tablet Commonly known as:  TYLENOL Take 1,000 mg by mouth as  needed for mild pain or headache.   albuterol 108 (90 Base) MCG/ACT inhaler Commonly known as:  PROVENTIL HFA;VENTOLIN HFA Inhale 2 puffs into the lungs every 4 (four) hours as needed for wheezing or shortness of breath.   albuterol (2.5 MG/3ML) 0.083% nebulizer solution Commonly known as:  PROVENTIL Take 3 mLs (2.5 mg total) by nebulization every 4 (four) hours as needed for wheezing or shortness of breath.   aspirin EC 81 MG tablet Take 81 mg by mouth daily.   atorvastatin 80 MG tablet Commonly known as:  LIPITOR Take 1 tablet (80 mg total) by mouth daily.   budesonide-formoterol 160-4.5 MCG/ACT inhaler Commonly known as:  SYMBICORT Inhale 2 puffs into the lungs 2 (two) times daily.   calcium acetate 667 MG capsule Commonly known as:  PHOSLO Take 1,334 mg by mouth 3 (three) times daily with meals.   cefUROXime 500 MG tablet Commonly known as:  CEFTIN Take 1 tablet (500 mg total) by mouth daily for 3 days.   cetirizine 10 MG tablet Commonly known as:  ZYRTEC Take 10 mg by mouth as needed for allergies.   clopidogrel 75 MG tablet Commonly known as:  PLAVIX Take 1 tablet (75 mg total) by mouth daily.   dextromethorphan-guaiFENesin 30-600 MG 12hr tablet Commonly known as:  MUCINEX DM Take 1 tablet by mouth 2 (two) times daily as needed for cough.   ethyl chloride spray Apply 1 application topically See admin instructions. Spray at dialysis on Mon/Wed/Fri as directed   famotidine 20 MG tablet Commonly known as:  PEPCID Take 1 tablet (20 mg total) by mouth 2 (two) times daily as needed for heartburn or indigestion.   Glycopyrrolate-Formoterol 9-4.8 MCG/ACT Aero Commonly known as:  BEVESPI AEROSPHERE Inhale 2 puffs into the lungs 2 (two) times daily.   HYDROcodone-homatropine 5-1.5 MG/5ML syrup Commonly known as:  HYCODAN Take 5 mLs by mouth every 4 (four) hours as needed for cough.   methocarbamol 500 MG tablet Commonly known as:  ROBAXIN Take 500 mg by mouth 2  (two) times daily as needed for muscle spasms.   metoprolol succinate 50 MG 24 hr tablet Commonly known as:  TOPROL-XL Take 50 mg by mouth 2 (two) times daily. Take with or immediately following a meal.   midodrine 10 MG tablet Commonly known as:  PROAMATINE Take 10 mg by mouth See admin instructions. Take 10 mg by mouth on Mon/Wed/Fri at dialysis as needed for low blood pressure   OXYGEN Inhale 1-2 L into the lungs daily as needed (for shortness of breath).   oxymetazoline 0.05 % nasal spray Commonly known as:  AFRIN Place 1 spray into both nostrils once as needed (to stop nose bleeds).   predniSONE 10 MG tablet Commonly known as:  DELTASONE Take 6 tablets (60 mg total) by mouth daily with breakfast. And decrease by one tablet daily, then continue Prednisone 10 mg po daily What changed:  additional instructions   predniSONE 5 MG tablet Commonly known as:  DELTASONE Take 2 tablets (10 mg total) by mouth daily with breakfast. What changed:  Another medication with the same name was changed. Make sure you understand how and when to take each.      Allergies  Allergen Reactions  . Yellow Dyes (Non-Tartrazine) Other (See Comments)  Burns arms; Cough medications (tussionex and benzonatate ok)  . Levaquin [Levofloxacin In D5w] Itching  . Levofloxacin Nausea And Vomiting   Follow-up Information    Terry Kern, DO Follow up in 2 week(s).   Specialty:  Family Medicine Contact information: [name] [address] [phone]        Burnell Blanks, MD .   Specialty:  Cardiology Contact information: [place] [address] [place] [address] [phone]            The results of significant diagnostics from this hospitalization (including imaging, microbiology, ancillary and laboratory) are listed below for reference.    Significant Diagnostic Studies: Dg Chest 2 View  Result Date: 11/29/2017 CLINICAL DATA:  Dyspnea x1  day. EXAM: CHEST - 2 VIEW COMPARISON:  11/22/2017 CXR, chest CT 07/16/2017 FINDINGS: Emphysematous hyperinflation of the lungs. Pulmonary opacities at the right lung base consistent with atelectasis and/or pneumonia. Follow-up to clearance recommended. Subsegmental atelectasis noted at the left lung base. Heart is within normal limits for size. There is aortic atherosclerosis. Rotator cuff calcific tendinopathy is seen bilaterally. IMPRESSION: Emphysematous lungs with right basilar opacity suspicious for pneumonia and/or atelectasis. Followup PA and lateral chest X-ray is recommended in 3-4 weeks following trial of antibiotic therapy to ensure resolution and exclude underlying malignancy. Electronically Signed   By: Ashley Royalty M.D.   On: 11/29/2017 19:00   Dg Chest 2 View  Result Date: 11/21/2017 CLINICAL DATA:  Shortness of breath and coughing. EXAM: CHEST - 2 VIEW COMPARISON:  July 16, 2017 FINDINGS: Atelectasis in the bases. No pneumothorax. Cardiomegaly. No other abnormalities. IMPRESSION: No active cardiopulmonary disease. Electronically Signed   By: Dorise Bullion III M.D   On: 11/21/2017 13:46   Dg Chest Port 1 View  Result Date: 11/22/2017 CLINICAL DATA:  Shortness of breath. EXAM: PORTABLE CHEST 1 VIEW COMPARISON:  Frontal and lateral views yesterday. FINDINGS: Stable hyperinflation. Bibasilar atelectasis or scarring. Unchanged cardiomegaly and mediastinal contours. No pulmonary edema, confluent airspace disease or large pleural effusion. No pneumothorax. IMPRESSION: Similar bibasilar atelectasis or scarring.  No new abnormality. Electronically Signed   By: Jeb Levering M.D.   On: 11/22/2017 22:34   Dg Knee Complete 4 Views Left  Result Date: 11/10/2017 CLINICAL DATA:  Left knee pain after fall 2 days ago. EXAM: LEFT KNEE - COMPLETE 4+ VIEW COMPARISON:  None. FINDINGS: No evidence of fracture, dislocation, or joint effusion. No evidence of arthropathy or other focal bone abnormality.  Vascular calcifications are noted. IMPRESSION: No acute abnormality seen in the left knee. Electronically Signed   By: Marijo Conception, M.D.   On: 11/10/2017 16:21   Dg Foot Complete Left  Result Date: 11/29/2017 CLINICAL DATA:  Left foot and toe pain. EXAM: LEFT FOOT - COMPLETE 3+ VIEW COMPARISON:  Foot radiograph 09/28/2017 FINDINGS: No fracture or dislocation. Unchanged alignment from prior exam. Minimal osteoarthritis at the great toe metatarsal phalangeal joint and throughout the digits. No bony destruction, osseous erosions or periosteal reaction. Soft tissue edema most prominent about the dorsum of the foot. No radiopaque foreign body. There are vascular calcifications. IMPRESSION: Dorsal soft tissue edema. Mild osteoarthritis without acute osseous abnormality. Electronically Signed   By: Jeb Levering M.D.   On: 11/29/2017 23:42    Microbiology: Recent Results (from the past 240 hour(s))  Culture, blood (routine x 2) Call MD if unable to obtain prior to antibiotics being given     Status: None (Preliminary result)  Collection Time: 11/30/17  6:13 AM  Result Value Ref Range Status   Specimen Description BLOOD SITE NOT SPECIFIED  Final   Special Requests   Final    BOTTLES DRAWN AEROBIC AND ANAEROBIC Blood Culture adequate volume   Culture   Final    NO GROWTH 3 DAYS Performed at [facility], [address] [place], [place] [address]    Report Status PENDING  Incomplete  Culture, blood (routine x 2) Call MD if unable to obtain prior to antibiotics being given     Status: None (Preliminary result)   Collection Time: 11/30/17  6:13 AM  Result Value Ref Range Status   Specimen Description BLOOD SITE NOT SPECIFIED  Final   Special Requests   Final    BOTTLES DRAWN AEROBIC AND ANAEROBIC Blood Culture adequate volume   Culture   Final    NO GROWTH 3 DAYS Performed at [facility], [place] [address] [place], [place] [address]    Report Status PENDING  Incomplete   Culture, sputum-assessment     Status: None   Collection Time: 12/01/17  8:45 PM  Result Value Ref Range Status   Specimen Description EXPECTORATED SPUTUM  Final   Special Requests NONE  Final   Sputum evaluation   Final    THIS SPECIMEN IS ACCEPTABLE FOR SPUTUM CULTURE Performed at [facility], [address], [place], [place] [address]    Report Status 12/01/2017 FINAL  Final  Culture, respiratory (NON-Expectorated)     Status: None (Preliminary result)   Collection Time: 12/01/17  8:45 PM  Result Value Ref Range Status   Specimen Description EXPECTORATED SPUTUM  Final   Special Requests NONE Reflexed from [id number]  Final   Gram Stain   Final    ABUNDANT WBC PRESENT, PREDOMINANTLY PMN FEW GRAM NEGATIVE RODS MODERATE YEAST    Culture   Final    FEW KLEBSIELLA PNEUMONIAE FEW YEAST IDENTIFICATION TO FOLLOW Performed at [facility], [place] [address], [place], [place] [address]    Report Status PENDING  Incomplete   Organism ID, Bacteria KLEBSIELLA PNEUMONIAE  Final      Susceptibility   Klebsiella pneumoniae - MIC*    AMPICILLIN >=32 RESISTANT Resistant     CEFAZOLIN <=4 SENSITIVE Sensitive     CEFEPIME <=1 SENSITIVE Sensitive     CEFTAZIDIME <=1 SENSITIVE Sensitive     CEFTRIAXONE <=1 SENSITIVE Sensitive     CIPROFLOXACIN <=0.25 SENSITIVE Sensitive     GENTAMICIN <=1 SENSITIVE Sensitive     IMIPENEM <=0.25 SENSITIVE Sensitive     TRIMETH/SULFA <=20 SENSITIVE Sensitive     AMPICILLIN/SULBACTAM 16 INTERMEDIATE Intermediate     PIP/TAZO 16 SENSITIVE Sensitive     Extended ESBL NEGATIVE Sensitive     * FEW KLEBSIELLA PNEUMONIAE     Labs: Basic Metabolic Panel: Recent Labs  Lab 11/29/17 1910 11/30/17 2150 12/01/17 1730 12/02/17 1101 12/03/17 0815  NA 138 137 137 139 134*  K 4.0 5.9* 5.7* 4.7 5.0  CL 93* 93* 92* 96* 95*  CO2 31 29 26 30 25   GLUCOSE 310* 117* 208* 212* 178*  BUN 40* 77* 100* 48* 73*  CREATININE 3.61* 5.67* 6.89* 4.52* 5.92*  CALCIUM  7.7* 8.0* 8.1* 8.2* 8.1*  PHOS  --   --  8.1*  --   --    Liver Function Tests: Recent Labs  Lab 12/01/17 1730  ALBUMIN 2.6*   No results for input(s): LIPASE, AMYLASE in  the last 168 hours. No results for input(s): AMMONIA in the last 168 hours. CBC: Recent Labs  Lab 11/29/17 1910 12/01/17 0257  WBC 10.4 15.9*  NEUTROABS 9.5*  --   HGB 11.1* 10.3*  HCT 36.2* 33.9*  MCV 100.6* 100.3*  PLT 270 247   Cardiac Enzymes: No results for input(s): CKTOTAL, CKMB, CKMBINDEX, TROPONINI in the last 168 hours. BNP: BNP (last 3 results) Recent Labs    11/22/17 2145 11/29/17 1910  BNP 659.0* 565.3*    ProBNP (last 3 results) No results for input(s): PROBNP in the last 8760 hours.  CBG: No results for input(s): GLUCAP in the last 168 hours.     Signed:  Oswald Hillock MD.  [facility] 12/04/2017, 12:02 PM

## 2017-12-04 NOTE — Progress Notes (Signed)
Subjective: Interval History: has complaints wants to go home.  Objective: Vital signs in last 24 hours: Temp:  [97.7 F (36.5 C)-98.3 F (36.8 C)] 98.3 F (36.8 C) (06/14 2240) Pulse Rate:  [72-114] 109 (06/14 2240) Resp:  [14-25] 19 (06/14 2240) BP: (89-135)/(42-82) 127/42 (06/14 2240) SpO2:  [92 %-96 %] 92 % (06/14 2240) FiO2 (%):  [28 %] 28 % (06/14 1329) Weight:  [66.2 kg (145 lb 15.1 oz)-68.3 kg (150 lb 9.2 oz)] 66.2 kg (145 lb 15.1 oz) (06/14 1200) Weight change:   Intake/Output from previous day: 06/14 0701 - 06/15 0700 In: 887.3 [P.O.:680; IV Piggyback:207.3] Out: 2087  Intake/Output this shift: No intake/output data recorded.  General appearance: alert, cooperative, no distress and pale Resp: diminished breath sounds bilaterally Cardio: S1, S2 normal and decreased hs GI: soft, non-tender; bowel sounds normal; no masses,  no organomegaly Extremities: AVF RUA  Lab Results: No results for input(s): WBC, HGB, HCT, PLT in the last 72 hours. BMET:  Recent Labs    12/02/17 1101 12/03/17 0815  NA 139 134*  K 4.7 5.0  CL 96* 95*  CO2 30 25  GLUCOSE 212* 178*  BUN 48* 73*  CREATININE 4.52* 5.92*  CALCIUM 8.2* 8.1*   No results for input(s): PTH in the last 72 hours. Iron Studies: No results for input(s): IRON, TIBC, TRANSFERRIN, FERRITIN in the last 72 hours.  Studies/Results: No results found.  I have reviewed the patient's current medications.  Assessment/Plan: 1 ESRD stable, HD MWF, vol ok 2 Anemia stable 3 HPTH  On med 4 COPD  Severe 5 ? pneu on AB converting to po 6 CAD 7 DM controlled but on steroids so caution P HD MWF, esa, AB, Bronchodil, wean steroids, cont O2   LOS: 5 days   Jeneen Rinks Adja Ruff 12/04/2017,7:22 AM

## 2017-12-04 NOTE — Plan of Care (Signed)
Discussed with patient and family plan of care, pain management and taking bath in morning per wife with teach back displayed.

## 2017-12-04 NOTE — Progress Notes (Signed)
PCCM notified this RN of HR in 150's unsustained. Dr Darrick Meigs made aware of this. He will look at meds to possibly adjust. Pt to be discharged today.

## 2017-12-05 LAB — CULTURE, BLOOD (ROUTINE X 2)
Culture: NO GROWTH
Culture: NO GROWTH
Special Requests: ADEQUATE
Special Requests: ADEQUATE

## 2017-12-06 ENCOUNTER — Telehealth: Payer: Self-pay | Admitting: *Deleted

## 2017-12-06 DIAGNOSIS — N186 End stage renal disease: Secondary | ICD-10-CM | POA: Diagnosis not present

## 2017-12-06 DIAGNOSIS — N2581 Secondary hyperparathyroidism of renal origin: Secondary | ICD-10-CM | POA: Diagnosis not present

## 2017-12-06 NOTE — Telephone Encounter (Signed)
Unable to reach patient at time of TCM Call. Patient's wife states that patient is currently at dialysis and she or patient will call back.

## 2017-12-07 ENCOUNTER — Encounter: Payer: Self-pay | Admitting: Podiatry

## 2017-12-07 ENCOUNTER — Ambulatory Visit: Payer: Medicare PPO | Admitting: Podiatry

## 2017-12-07 DIAGNOSIS — S90222A Contusion of left lesser toe(s) with damage to nail, initial encounter: Secondary | ICD-10-CM | POA: Diagnosis not present

## 2017-12-07 DIAGNOSIS — T148XXA Other injury of unspecified body region, initial encounter: Secondary | ICD-10-CM

## 2017-12-07 MED ORDER — SULFAMETHOXAZOLE-TRIMETHOPRIM 800-160 MG PO TABS: 1.0000 | ORAL_TABLET | Freq: Two times a day (BID) | ORAL | 0 refills | Status: DC

## 2017-12-07 NOTE — Telephone Encounter (Signed)
There are afternoon/late afternoon appts on the next 2 tuesdays and thursdays. Ok to block 2 adjacent 15 minute appts.

## 2017-12-07 NOTE — Telephone Encounter (Signed)
Transition Care Management Follow-up Telephone Call  Per Discharge Summary: Admit date: 11/29/2017 Discharge date: 12/04/2017  Recommendations for Outpatient Follow-up:  1. Follow up PCP in 2 weeks  Discharge Diagnoses:  Principal Problem:   Acute on chronic respiratory failure with hypoxia (HCC) Active Problems:   Essential hypertension   CAD (coronary artery disease)   Chronic atrial fibrillation (HCC)   Acute on chronic combined systolic and diastolic CHF (congestive heart failure) (HCC)   Anemia of chronic disease   ESRD (end stage renal disease) on dialysis (HCC)   COPD exacerbation (HCC)   Drowsiness   Aortic valve stenosis  Discharge Condition: Stable  Diet recommendation: Heart healthy diet  -- Call completed w/ patient's wife (on [name]).   How have you been since you were released from the hospital? Wife reports patient just got back from dentist and was told he has thrush. Dentist consulted with dialysis doctor and prescribed medication to treat thrush. Wife also notes that patient has some "black spots" on his toes and wants to get him in to see his podiatrist. He has an appt in July, but she is going to call in for a sooner appt. Reports patient is very weak and has had some diarrhea that is responding to Immodium. She also got some probiotics for him to take. His cough and breathing are improved per wife.   Do you understand why you were in the hospital? yes   Do you understand the discharge instructions? yes   Where were you discharged to? Home   Items Reviewed:  Medications reviewed: yes  Allergies reviewed: yes  Dietary changes reviewed: yes  Referrals reviewed: yes, already follows w/ Dr. [name] (cardiology)    Functional Questionnaire:   Activities of Daily Living (ADLs):   He states they are independent in the following: feeding and grooming States they require assistance with the following: ambulation, bathing and hygiene, continence,  toileting and dressing. Wife is assisting w/ ADLs.   Any transportation issues/concerns?: no   Any patient concerns? yes, [place]possibly within the next 4-5 months) because she feels like he is able to breathe easier when he is there. She would like to discuss process for getting set up with providers/specialists in that area.   Confirmed importance and date/time of follow-up visits scheduled no, unable to find a time that works w/ patient and provider schedule. Patient needs PM appt on Tue/Thu. Please advise if he can be worked in somewhere.  Confirmed with patient if condition begins to worsen call PCP or go to the ER.  Patient was given the office number and encouraged to call back with question or concerns.  : yes

## 2017-12-07 NOTE — Progress Notes (Signed)
This patient presents to the office stating that he has developed a black skin lesions noted at the site of his second, third and fourth toenails, left foot.  He states that these have been present for the last few days and are painful.  He presents to the office with his wife for an evaluation of these toes.  He was seen approximately 6 weeks ago for severely painful left heel.  He was treated with antibiotics and told to return home and call the office if the problem continues.  He says today the heel  is causing no pain or discomfort.  His wife also says he never had vascular studies performed on his left extremity.  He presents the office today, not remembering any history of an injury to the toes on his left foot.  He does have severe swelling noted in the legs and feet bilaterally.  He also relates to having pain between the toes and in the toes the left foot.  He presents the office today for an evaluation and treatment of his toes, left foot.  General Appearance  Alert, conversant and in no acute stress.  Vascular  Dorsalis pedis and posterior tibial  pulses are absent  Bilaterally due to swelling. Capillary return is within normal limits  bilaterally. Temperature is within normal limits  bilaterally.  Neurologic  Senn-Weinstein monofilament wire test within normal limits  bilaterally. Muscle power within normal limits bilaterally.  Nails Thick disfigured discolored nails with subungual debris  from hallux to fifth toes bilaterally. No evidence of bacterial infection or drainage bilaterally. Hematoma noted second and third and fourth nail beds.  Hematoma ulcer fourth mycotic nail left foot.  Orthopedic  No limitations of motion of motion feet .  No crepitus or effusions noted.  No bony pathology or digital deformities noted.  Skin  normotropic skin with no porokeratosis noted bilaterally.  No signs of infections or ulcers noted.  Healing noted left heel.  Hematoma 2,3,4 left foot.  ROV.   Examination of his foot reveals hematoma noted at the tips of the toes, left foot.  This gives the appearance of a possible injury to the nailbed left toes. No redness or infection or drainage noted.  The blackened hematoma can be consistent with a crust/skin necrosis with healing noted under the blackened crust third toe.  Upon debridement of the crust third toe , normal skin was noted.  Therefore I told this patient to soak his toes and take antibiotics to prevent infection.  Prescribed Bactrim.  I still recommended a vascular study be performed of this left foot/leg. RTC prn.   Gardiner Barefoot DPM

## 2017-12-08 ENCOUNTER — Telehealth: Payer: Self-pay | Admitting: Podiatry

## 2017-12-08 DIAGNOSIS — N2581 Secondary hyperparathyroidism of renal origin: Secondary | ICD-10-CM | POA: Diagnosis not present

## 2017-12-08 DIAGNOSIS — N186 End stage renal disease: Secondary | ICD-10-CM | POA: Diagnosis not present

## 2017-12-08 NOTE — Telephone Encounter (Signed)
I spoke with pt's Wife, Thayer Headings and she states pt is at [facility] now.

## 2017-12-08 NOTE — Telephone Encounter (Signed)
[facility] states Dr. Lorrene Reid states pt can not take the Septra DS. I informed Dr. Prudence Davidson and he states inform pt that he feel the areas are more related to trauma and not infection and he does not feel it is necessary to order an antibiotic at this time.

## 2017-12-08 NOTE — Telephone Encounter (Signed)
Terry [name], [facility] states pt is completing Ceftin 500mg  to be given at 3 dialysis appts after hospital discharge and Diflucan from the dentist. I told Terry [name] our Dr. Prudence Davidson would like pt to be on Septra DS #20 one tablet twice daily. [name] states she will have to contact Dr. Jamal Maes and call again.

## 2017-12-08 NOTE — Telephone Encounter (Signed)
[facility] called to confirm medication dosage since pt is on dialysis. Please call [phone].

## 2017-12-08 NOTE — Telephone Encounter (Signed)
Appointment scheduled.

## 2017-12-08 NOTE — Telephone Encounter (Signed)
I informed pt of Dr. Lorrene Reid and Dr. Burnell Blanks orders. Pt states he will not take the antibiotic.

## 2017-12-10 DIAGNOSIS — N186 End stage renal disease: Secondary | ICD-10-CM | POA: Diagnosis not present

## 2017-12-10 DIAGNOSIS — N2581 Secondary hyperparathyroidism of renal origin: Secondary | ICD-10-CM | POA: Diagnosis not present

## 2017-12-13 DIAGNOSIS — N2581 Secondary hyperparathyroidism of renal origin: Secondary | ICD-10-CM | POA: Diagnosis not present

## 2017-12-13 DIAGNOSIS — N186 End stage renal disease: Secondary | ICD-10-CM | POA: Diagnosis not present

## 2017-12-14 ENCOUNTER — Ambulatory Visit (HOSPITAL_COMMUNITY)
Admission: RE | Admit: 2017-12-14 | Discharge: 2017-12-14 | Disposition: A | Discharge status: Home / Self Care | Payer: Medicare PPO | Source: Ambulatory Visit | Attending: Vascular Surgery | Admitting: Vascular Surgery

## 2017-12-14 ENCOUNTER — Ambulatory Visit: Payer: Medicare PPO | Admitting: Vascular Surgery

## 2017-12-14 ENCOUNTER — Inpatient Hospital Stay: Payer: Medicare PPO | Admitting: Family Medicine

## 2017-12-14 ENCOUNTER — Encounter: Payer: Self-pay | Admitting: *Deleted

## 2017-12-14 ENCOUNTER — Encounter: Payer: Self-pay | Admitting: Vascular Surgery

## 2017-12-14 VITALS — BP 114/54 | HR 97 | Temp 97.1°F | Ht 68.0 in | Wt 150.5 lb

## 2017-12-14 DIAGNOSIS — I779 Disorder of arteries and arterioles, unspecified: Secondary | ICD-10-CM | POA: Diagnosis not present

## 2017-12-14 DIAGNOSIS — I2510 Atherosclerotic heart disease of native coronary artery without angina pectoris: Secondary | ICD-10-CM | POA: Diagnosis not present

## 2017-12-14 DIAGNOSIS — I5031 Acute diastolic (congestive) heart failure: Secondary | ICD-10-CM | POA: Diagnosis not present

## 2017-12-14 DIAGNOSIS — Z992 Dependence on renal dialysis: Secondary | ICD-10-CM | POA: Diagnosis not present

## 2017-12-14 DIAGNOSIS — R2681 Unsteadiness on feet: Secondary | ICD-10-CM | POA: Diagnosis not present

## 2017-12-14 DIAGNOSIS — I739 Peripheral vascular disease, unspecified: Secondary | ICD-10-CM

## 2017-12-14 DIAGNOSIS — M6281 Muscle weakness (generalized): Secondary | ICD-10-CM | POA: Diagnosis not present

## 2017-12-14 DIAGNOSIS — J449 Chronic obstructive pulmonary disease, unspecified: Secondary | ICD-10-CM | POA: Diagnosis not present

## 2017-12-14 DIAGNOSIS — D500 Iron deficiency anemia secondary to blood loss (chronic): Secondary | ICD-10-CM | POA: Diagnosis not present

## 2017-12-14 DIAGNOSIS — L0390 Cellulitis, unspecified: Secondary | ICD-10-CM | POA: Diagnosis not present

## 2017-12-14 DIAGNOSIS — K922 Gastrointestinal hemorrhage, unspecified: Secondary | ICD-10-CM | POA: Diagnosis not present

## 2017-12-14 NOTE — H&P (View-Only) (Signed)
Vascular and Vein Specialist of [place]  Patient name: Terry [name]. MRN: [id number] DOB: [birth year] Sex: male  REASON FOR VISIT: Evaluation lower extremity arterial insufficiency left greater than right  HPI: Terry [name]. is a 83 y.o. male here today for evaluation of lower extremity arterial insufficiency.  He has multiple major organ system medical problems.  He is end-stage renal disease and has dialysis via a right upper arm AV fistula placed by Dr. Doren Custard 2016 ureide COPD and coronary artery disease with a history of aortic stenosis.  He has been admitted several times in the last several weeks for COPD exacerbation.  He has had marked lower extremity swelling and underwent noninvasive studies to rule out DVT.  There was no evidence of DVT but the patient had incidental finding of high-grade stenosis in the right common femoral artery and probable occlusion in the left common femoral artery.  He does seek some of his medical care in [facility] and reports that this had been known of what sounds like aortoiliac occlusive disease.  Does have bilateral calf claudication and reports that the left side is worse than the right.  He does limited walking due to his other comorbidities.  He is on chronic oxygen therapy.  He is sitting in a wheelchair today.  He recently has had excoriation over the tips of his left toes on 1 through 5 and has had a slow healing of these with some pain present.  He does not have any arterial rest pain.  Past Medical History:  Diagnosis Date  . Acute and chronic respiratory failure 10/22/2012  . Anxiety   . Arthritis    "hands, knees" (03/15/2017)  . Atrial fibrillation ([place])   . CAD (coronary artery disease) [place], [name]  . CHF (congestive heart failure) ([place]) 09/2012  . COPD (chronic obstructive pulmonary disease) ([place])   . ESRD (end stage renal disease) on dialysis [facility])    "MWF; [facility]  [place]" (03/15/2017)  . Flu 09/24/2016   influenza b  . HCAP (healthcare-associated pneumonia) 03/15/2017  . High cholesterol   . History of blood transfusion 2012   "related to bowel resection"  . Hypertension   . On home oxygen therapy    "1L typically at night" (03/15/2017)  . Osteoarthritis 10/22/2012  . Pneumonia April 2014; 09/2016  . Shortness of breath dyspnea   . Stroke [facility]) 2012   denies residual on 03/15/2017  . Stroke [facility]) ~ 2013   "small one in his right eye"    Family History  Problem Relation Age of Onset  . Cancer Mother        Breast  . Heart disease Father   . Heart attack Father   . Parkinson's disease Brother     SOCIAL HISTORY: Social History   Tobacco Use  . Smoking status: Former Smoker    Packs/day: 1.00    Years: 49.00    Pack years: 49.00    Types: Cigarettes, Pipe, Cigars    Last attempt to quit: 02/21/2000    Years since quitting: 17.8  . Smokeless tobacco: Former Systems developer    Types: Chew  . Tobacco comment: "chewed when he was a kid"  Substance Use Topics  . Alcohol use: Yes    Comment: 03/15/2017 "nothing since the 1960's"    Allergies  Allergen Reactions  . Yellow Dyes (Non-Tartrazine) Other (See Comments)    Burns arms; Cough medications (tussionex and benzonatate ok)  .  Levaquin [Levofloxacin In D5w] Itching  . Levofloxacin Nausea And Vomiting    Current Outpatient Medications  Medication Sig Dispense Refill  . acetaminophen (TYLENOL) 500 MG tablet Take 1,000 mg by mouth as needed for mild pain or headache.    . albuterol (PROVENTIL HFA;VENTOLIN HFA) 108 (90 Base) MCG/ACT inhaler Inhale 2 puffs into the lungs every 4 (four) hours as needed for wheezing or shortness of breath. 1 Inhaler 0  . albuterol (PROVENTIL) (2.5 MG/3ML) 0.083% nebulizer solution Take 3 mLs (2.5 mg total) by nebulization every 4 (four) hours as needed for wheezing or shortness of breath. 75 mL 0  . aspirin EC 81 MG tablet Take 81 mg by mouth daily.    [name]  atorvastatin (LIPITOR) 80 MG tablet Take 1 tablet (80 mg total) by mouth daily. 90 tablet 3  . budesonide-formoterol (SYMBICORT) 160-4.5 MCG/ACT inhaler Inhale 2 puffs into the lungs 2 (two) times daily.    . calcium acetate (PHOSLO) 667 MG capsule Take 1,334 mg by mouth 3 (three) times daily with meals.     . cetirizine (ZYRTEC) 10 MG tablet Take 10 mg by mouth as needed for allergies.    [name] clopidogrel (PLAVIX) 75 MG tablet Take 1 tablet (75 mg total) by mouth daily. 90 tablet 3  . dextromethorphan-guaiFENesin (MUCINEX DM) 30-600 MG 12hr tablet Take 1 tablet by mouth 2 (two) times daily as needed for cough.    . ethyl chloride spray Apply 1 application topically See admin instructions. Spray at dialysis on Mon/Wed/Fri as directed    . famotidine (PEPCID) 20 MG tablet Take 1 tablet (20 mg total) by mouth 2 (two) times daily as needed for heartburn or indigestion. 30 tablet 1  . Glycopyrrolate-Formoterol (BEVESPI AEROSPHERE) 9-4.8 MCG/ACT AERO Inhale 2 puffs into the lungs 2 (two) times daily. 1 Inhaler 11  . HYDROcodone-homatropine (HYCODAN) 5-1.5 MG/5ML syrup Take 5 mLs by mouth every 4 (four) hours as needed for cough. 120 mL 0  . methocarbamol (ROBAXIN) 500 MG tablet Take 500 mg by mouth 2 (two) times daily as needed for muscle spasms.    . metoprolol succinate (TOPROL-XL) 50 MG 24 hr tablet Take 50 mg by mouth 2 (two) times daily. Take with or immediately following a meal.     . midodrine (PROAMATINE) 10 MG tablet Take 10 mg by mouth See admin instructions. Take 10 mg by mouth on Mon/Wed/Fri at dialysis as needed for low blood pressure    . OXYGEN Inhale 1-2 L into the lungs daily as needed (for shortness of breath).     [name] oxymetazoline (AFRIN) 0.05 % nasal spray Place 1 spray into both nostrils once as needed (to stop nose bleeds).     . predniSONE (DELTASONE) 10 MG tablet Take 6 tablets (60 mg total) by mouth daily with breakfast. And decrease by one tablet daily, then continue Prednisone 10 mg  po daily 21 tablet 0  . sulfamethoxazole-trimethoprim (BACTRIM DS,SEPTRA DS) 800-160 MG tablet Take 1 tablet by mouth 2 (two) times daily. 20 tablet 0  . predniSONE (DELTASONE) 5 MG tablet Take 2 tablets (10 mg total) by mouth daily with breakfast. (Patient not taking: Reported on 12/14/2017) 60 tablet 0   No current facility-administered medications for this visit.     REVIEW OF SYSTEMS:  [X]  denotes positive finding, [ ]  denotes negative finding Cardiac  Comments:  Chest pain or chest pressure:    Shortness of breath upon exertion: x   Short of breath when lying flat: x  Irregular heart rhythm: x       Vascular    Pain in calf, thigh, or hip brought on by ambulation: x   Pain in feet at night that wakes you up from your sleep:     Blood clot in your veins:    Leg swelling:  x         PHYSICAL EXAM: Vitals:   12/14/17 1507  BP: (!) 114/54  Pulse: 97  Temp: (!) 97.1 F (36.2 C)  TempSrc: Oral  SpO2: 95%  Weight: 150 lb 8 oz (68.3 kg)  Height: 5\' 8"  (1.727 m)    GENERAL: The patient is a well-nourished male, in no acute distress. The vital signs are documented above. CARDIOVASCULAR: He does have excellent thrill in his right upper arm fistula.  He has a 1-2+ right femoral pulse and absent left femoral pulse.  He has no pulses in his feet.  Has marked swelling in his legs from his knees down into his feet bilaterally. PULMONARY: There is good air exchange  MUSCULOSKELETAL: There are no major deformities or cyanosis. NEUROLOGIC: No focal weakness or paresthesias are detected. SKIN: There are no ulcers or rashes noted. PSYCHIATRIC: The patient has a normal affect.  DATA:  Noninvasive studies reveal calcification therefore index is not reliable.  He has absent left dorsalis pedis and great toe pressure.  He has a monophasic posterior tibial pressure on the left.  He has monophasic anterior and posterior tibial on the right  MEDICAL ISSUES: I long discussion with the patient  his wife present.  I do feel that he is at high risk for amputation of his left foot related to severe arterial insufficiency and excoriation and tissue loss.  I recommended arteriography for further evaluation and potential endovascular treatment.  He would obviously be at high surgical risk due to his cardiac, pulmonary and renal dysfunction.    Rosetta Posner, MD FACS [facility] Specialists of [facility] Tel [phone] Pager [phone]

## 2017-12-14 NOTE — Progress Notes (Signed)
Vascular and Vein Specialist of [place]  Patient name: Terry [name]. MRN: [id number] DOB: [birth year] Sex: male  REASON FOR VISIT: Evaluation lower extremity arterial insufficiency left greater than right  HPI: Terry [name]. is a 83 y.o. male here today for evaluation of lower extremity arterial insufficiency.  He has multiple major organ system medical problems.  He is end-stage renal disease and has dialysis via a right upper arm AV fistula placed by Dr. Doren Custard 2016 ureide COPD and coronary artery disease with a history of aortic stenosis.  He has been admitted several times in the last several weeks for COPD exacerbation.  He has had marked lower extremity swelling and underwent noninvasive studies to rule out DVT.  There was no evidence of DVT but the patient had incidental finding of high-grade stenosis in the right common femoral artery and probable occlusion in the left common femoral artery.  He does seek some of his medical care in [facility] and reports that this had been known of what sounds like aortoiliac occlusive disease.  Does have bilateral calf claudication and reports that the left side is worse than the right.  He does limited walking due to his other comorbidities.  He is on chronic oxygen therapy.  He is sitting in a wheelchair today.  He recently has had excoriation over the tips of his left toes on 1 through 5 and has had a slow healing of these with some pain present.  He does not have any arterial rest pain.  Past Medical History:  Diagnosis Date  . Acute and chronic respiratory failure 10/22/2012  . Anxiety   . Arthritis    "hands, knees" (03/15/2017)  . Atrial fibrillation ([place])   . CAD (coronary artery disease) [place], [name]  . CHF (congestive heart failure) ([place]) 09/2012  . COPD (chronic obstructive pulmonary disease) ([place])   . ESRD (end stage renal disease) on dialysis [facility])    "MWF; [facility]  [place]" (03/15/2017)  . Flu 09/24/2016   influenza b  . HCAP (healthcare-associated pneumonia) 03/15/2017  . High cholesterol   . History of blood transfusion 2012   "related to bowel resection"  . Hypertension   . On home oxygen therapy    "1L typically at night" (03/15/2017)  . Osteoarthritis 10/22/2012  . Pneumonia April 2014; 09/2016  . Shortness of breath dyspnea   . Stroke [facility]) 2012   denies residual on 03/15/2017  . Stroke [facility]) ~ 2013   "small one in his right eye"    Family History  Problem Relation Age of Onset  . Cancer Mother        Breast  . Heart disease Father   . Heart attack Father   . Parkinson's disease Brother     SOCIAL HISTORY: Social History   Tobacco Use  . Smoking status: Former Smoker    Packs/day: 1.00    Years: 49.00    Pack years: 49.00    Types: Cigarettes, Pipe, Cigars    Last attempt to quit: 02/21/2000    Years since quitting: 17.8  . Smokeless tobacco: Former Systems developer    Types: Chew  . Tobacco comment: "chewed when he was a kid"  Substance Use Topics  . Alcohol use: Yes    Comment: 03/15/2017 "nothing since the 1960's"    Allergies  Allergen Reactions  . Yellow Dyes (Non-Tartrazine) Other (See Comments)    Burns arms; Cough medications (tussionex and benzonatate ok)  .  Levaquin [Levofloxacin In D5w] Itching  . Levofloxacin Nausea And Vomiting    Current Outpatient Medications  Medication Sig Dispense Refill  . acetaminophen (TYLENOL) 500 MG tablet Take 1,000 mg by mouth as needed for mild pain or headache.    . albuterol (PROVENTIL HFA;VENTOLIN HFA) 108 (90 Base) MCG/ACT inhaler Inhale 2 puffs into the lungs every 4 (four) hours as needed for wheezing or shortness of breath. 1 Inhaler 0  . albuterol (PROVENTIL) (2.5 MG/3ML) 0.083% nebulizer solution Take 3 mLs (2.5 mg total) by nebulization every 4 (four) hours as needed for wheezing or shortness of breath. 75 mL 0  . aspirin EC 81 MG tablet Take 81 mg by mouth daily.    [name]  atorvastatin (LIPITOR) 80 MG tablet Take 1 tablet (80 mg total) by mouth daily. 90 tablet 3  . budesonide-formoterol (SYMBICORT) 160-4.5 MCG/ACT inhaler Inhale 2 puffs into the lungs 2 (two) times daily.    . calcium acetate (PHOSLO) 667 MG capsule Take 1,334 mg by mouth 3 (three) times daily with meals.     . cetirizine (ZYRTEC) 10 MG tablet Take 10 mg by mouth as needed for allergies.    [name] clopidogrel (PLAVIX) 75 MG tablet Take 1 tablet (75 mg total) by mouth daily. 90 tablet 3  . dextromethorphan-guaiFENesin (MUCINEX DM) 30-600 MG 12hr tablet Take 1 tablet by mouth 2 (two) times daily as needed for cough.    . ethyl chloride spray Apply 1 application topically See admin instructions. Spray at dialysis on Mon/Wed/Fri as directed    . famotidine (PEPCID) 20 MG tablet Take 1 tablet (20 mg total) by mouth 2 (two) times daily as needed for heartburn or indigestion. 30 tablet 1  . Glycopyrrolate-Formoterol (BEVESPI AEROSPHERE) 9-4.8 MCG/ACT AERO Inhale 2 puffs into the lungs 2 (two) times daily. 1 Inhaler 11  . HYDROcodone-homatropine (HYCODAN) 5-1.5 MG/5ML syrup Take 5 mLs by mouth every 4 (four) hours as needed for cough. 120 mL 0  . methocarbamol (ROBAXIN) 500 MG tablet Take 500 mg by mouth 2 (two) times daily as needed for muscle spasms.    . metoprolol succinate (TOPROL-XL) 50 MG 24 hr tablet Take 50 mg by mouth 2 (two) times daily. Take with or immediately following a meal.     . midodrine (PROAMATINE) 10 MG tablet Take 10 mg by mouth See admin instructions. Take 10 mg by mouth on Mon/Wed/Fri at dialysis as needed for low blood pressure    . OXYGEN Inhale 1-2 L into the lungs daily as needed (for shortness of breath).     [name] oxymetazoline (AFRIN) 0.05 % nasal spray Place 1 spray into both nostrils once as needed (to stop nose bleeds).     . predniSONE (DELTASONE) 10 MG tablet Take 6 tablets (60 mg total) by mouth daily with breakfast. And decrease by one tablet daily, then continue Prednisone 10 mg  po daily 21 tablet 0  . sulfamethoxazole-trimethoprim (BACTRIM DS,SEPTRA DS) 800-160 MG tablet Take 1 tablet by mouth 2 (two) times daily. 20 tablet 0  . predniSONE (DELTASONE) 5 MG tablet Take 2 tablets (10 mg total) by mouth daily with breakfast. (Patient not taking: Reported on 12/14/2017) 60 tablet 0   No current facility-administered medications for this visit.     REVIEW OF SYSTEMS:  [X]  denotes positive finding, [ ]  denotes negative finding Cardiac  Comments:  Chest pain or chest pressure:    Shortness of breath upon exertion: x   Short of breath when lying flat: x  Irregular heart rhythm: x       Vascular    Pain in calf, thigh, or hip brought on by ambulation: x   Pain in feet at night that wakes you up from your sleep:     Blood clot in your veins:    Leg swelling:  x         PHYSICAL EXAM: Vitals:   12/14/17 1507  BP: (!) 114/54  Pulse: 97  Temp: (!) 97.1 F (36.2 C)  TempSrc: Oral  SpO2: 95%  Weight: 150 lb 8 oz (68.3 kg)  Height: 5\' 8"  (1.727 m)    GENERAL: The patient is a well-nourished male, in no acute distress. The vital signs are documented above. CARDIOVASCULAR: He does have excellent thrill in his right upper arm fistula.  He has a 1-2+ right femoral pulse and absent left femoral pulse.  He has no pulses in his feet.  Has marked swelling in his legs from his knees down into his feet bilaterally. PULMONARY: There is good air exchange  MUSCULOSKELETAL: There are no major deformities or cyanosis. NEUROLOGIC: No focal weakness or paresthesias are detected. SKIN: There are no ulcers or rashes noted. PSYCHIATRIC: The patient has a normal affect.  DATA:  Noninvasive studies reveal calcification therefore index is not reliable.  He has absent left dorsalis pedis and great toe pressure.  He has a monophasic posterior tibial pressure on the left.  He has monophasic anterior and posterior tibial on the right  MEDICAL ISSUES: I long discussion with the patient  his wife present.  I do feel that he is at high risk for amputation of his left foot related to severe arterial insufficiency and excoriation and tissue loss.  I recommended arteriography for further evaluation and potential endovascular treatment.  He would obviously be at high surgical risk due to his cardiac, pulmonary and renal dysfunction.    Rosetta Posner, MD FACS [facility] Vein Specialists of [facility] Tel [phone] Pager [phone]

## 2017-12-14 NOTE — Telephone Encounter (Signed)
Pt was unable to make his appt today Dr Maudie Mercury d/t an earlier appt this afternoon running behind with [facility] Dr Luther Parody office  Per Dr Maudie Mercury okay to reschedule to [facility] 12/16/17 at [facility] and spoke with wife Thayer Headings and she states that they just found out that Vinicio is going into the hospital Thursday at 10am to have a procedure done and will not be able to have another appt that same day.   Pt wife Thayer Headings advised that once he is home and settled then we will schedule a regular follow up appt with Dr Maudie Mercury to touch base. Thayer Headings states that Awais is in and out of Doctor appts since being d/c'd home and she does not feel that he needs the appt with Dr Maudie Mercury at this time as getting this procedure taken care of first is the most important thing that this time.   Will send to Dr Maudie Mercury as Juluis Rainier.

## 2017-12-15 ENCOUNTER — Other Ambulatory Visit: Payer: Self-pay | Admitting: *Deleted

## 2017-12-15 DIAGNOSIS — N186 End stage renal disease: Secondary | ICD-10-CM | POA: Diagnosis not present

## 2017-12-15 DIAGNOSIS — N2581 Secondary hyperparathyroidism of renal origin: Secondary | ICD-10-CM | POA: Diagnosis not present

## 2017-12-16 ENCOUNTER — Telehealth: Payer: Self-pay | Admitting: Surgery

## 2017-12-16 ENCOUNTER — Encounter (HOSPITAL_COMMUNITY)
Admission: RE | Disposition: A | Discharge status: Home / Self Care | Payer: Self-pay | Source: Ambulatory Visit | Attending: Vascular Surgery

## 2017-12-16 ENCOUNTER — Ambulatory Visit (HOSPITAL_COMMUNITY)
Admission: RE | Admit: 2017-12-16 | Discharge: 2017-12-16 | Disposition: A | Discharge status: Home / Self Care | Payer: Medicare PPO | Source: Ambulatory Visit | Attending: Vascular Surgery | Admitting: Vascular Surgery

## 2017-12-16 ENCOUNTER — Encounter (HOSPITAL_COMMUNITY): Payer: Medicare PPO

## 2017-12-16 DIAGNOSIS — Z9981 Dependence on supplemental oxygen: Secondary | ICD-10-CM | POA: Diagnosis not present

## 2017-12-16 DIAGNOSIS — E7800 Pure hypercholesterolemia, unspecified: Secondary | ICD-10-CM | POA: Insufficient documentation

## 2017-12-16 DIAGNOSIS — I739 Peripheral vascular disease, unspecified: Secondary | ICD-10-CM | POA: Diagnosis present

## 2017-12-16 DIAGNOSIS — I2510 Atherosclerotic heart disease of native coronary artery without angina pectoris: Secondary | ICD-10-CM | POA: Diagnosis not present

## 2017-12-16 DIAGNOSIS — I70211 Atherosclerosis of native arteries of extremities with intermittent claudication, right leg: Secondary | ICD-10-CM | POA: Insufficient documentation

## 2017-12-16 DIAGNOSIS — N186 End stage renal disease: Secondary | ICD-10-CM | POA: Insufficient documentation

## 2017-12-16 DIAGNOSIS — Z7951 Long term (current) use of inhaled steroids: Secondary | ICD-10-CM | POA: Insufficient documentation

## 2017-12-16 DIAGNOSIS — J9610 Chronic respiratory failure, unspecified whether with hypoxia or hypercapnia: Secondary | ICD-10-CM | POA: Diagnosis not present

## 2017-12-16 DIAGNOSIS — I509 Heart failure, unspecified: Secondary | ICD-10-CM | POA: Diagnosis not present

## 2017-12-16 DIAGNOSIS — I4891 Unspecified atrial fibrillation: Secondary | ICD-10-CM | POA: Diagnosis not present

## 2017-12-16 DIAGNOSIS — L97529 Non-pressure chronic ulcer of other part of left foot with unspecified severity: Secondary | ICD-10-CM | POA: Diagnosis not present

## 2017-12-16 DIAGNOSIS — Z87891 Personal history of nicotine dependence: Secondary | ICD-10-CM | POA: Insufficient documentation

## 2017-12-16 DIAGNOSIS — Z7982 Long term (current) use of aspirin: Secondary | ICD-10-CM | POA: Insufficient documentation

## 2017-12-16 DIAGNOSIS — I7409 Other arterial embolism and thrombosis of abdominal aorta: Secondary | ICD-10-CM

## 2017-12-16 DIAGNOSIS — Z992 Dependence on renal dialysis: Secondary | ICD-10-CM | POA: Diagnosis not present

## 2017-12-16 DIAGNOSIS — J449 Chronic obstructive pulmonary disease, unspecified: Secondary | ICD-10-CM | POA: Insufficient documentation

## 2017-12-16 DIAGNOSIS — I132 Hypertensive heart and chronic kidney disease with heart failure and with stage 5 chronic kidney disease, or end stage renal disease: Secondary | ICD-10-CM | POA: Insufficient documentation

## 2017-12-16 DIAGNOSIS — M1990 Unspecified osteoarthritis, unspecified site: Secondary | ICD-10-CM | POA: Diagnosis not present

## 2017-12-16 DIAGNOSIS — F419 Anxiety disorder, unspecified: Secondary | ICD-10-CM | POA: Diagnosis not present

## 2017-12-16 DIAGNOSIS — Z7902 Long term (current) use of antithrombotics/antiplatelets: Secondary | ICD-10-CM | POA: Insufficient documentation

## 2017-12-16 DIAGNOSIS — I70245 Atherosclerosis of native arteries of left leg with ulceration of other part of foot: Secondary | ICD-10-CM | POA: Diagnosis not present

## 2017-12-16 DIAGNOSIS — Z8673 Personal history of transient ischemic attack (TIA), and cerebral infarction without residual deficits: Secondary | ICD-10-CM | POA: Insufficient documentation

## 2017-12-16 DIAGNOSIS — I70213 Atherosclerosis of native arteries of extremities with intermittent claudication, bilateral legs: Secondary | ICD-10-CM | POA: Diagnosis not present

## 2017-12-16 HISTORY — PX: ABDOMINAL AORTOGRAM: CATH118222

## 2017-12-16 HISTORY — PX: LOWER EXTREMITY ANGIOGRAPHY: CATH118251

## 2017-12-16 LAB — POCT I-STAT, CHEM 8
BUN: 36 mg/dL — AB (ref 8–23)
CREATININE: 4.2 mg/dL — AB (ref 0.61–1.24)
Calcium, Ion: 1.04 mmol/L — ABNORMAL LOW (ref 1.15–1.40)
Chloride: 94 mmol/L — ABNORMAL LOW (ref 98–111)
Glucose, Bld: 79 mg/dL (ref 70–99)
HEMATOCRIT: 41 % (ref 39.0–52.0)
Hemoglobin: 13.9 g/dL (ref 13.0–17.0)
POTASSIUM: 4.1 mmol/L (ref 3.5–5.1)
Sodium: 135 mmol/L (ref 135–145)
TCO2: 33 mmol/L — ABNORMAL HIGH (ref 22–32)

## 2017-12-16 SURGERY — LOWER EXTREMITY ANGIOGRAPHY
Anesthesia: LOCAL

## 2017-12-16 MED ORDER — OXYCODONE HCL 5 MG PO TABS: 5.0000 mg | ORAL_TABLET | ORAL | Status: DC | PRN

## 2017-12-16 MED ORDER — SODIUM CHLORIDE 0.9% FLUSH: 3.0000 mL | Freq: Two times a day (BID) | INTRAVENOUS | Status: DC

## 2017-12-16 MED ORDER — LIDOCAINE HCL (PF) 1 % IJ SOLN
INTRAMUSCULAR | Status: DC | PRN
  Administered 2017-12-16: 7 mL

## 2017-12-16 MED ORDER — FENTANYL CITRATE (PF) 100 MCG/2ML IJ SOLN
INTRAMUSCULAR | Status: AC
  Filled 2017-12-16: qty 2

## 2017-12-16 MED ORDER — IODIXANOL 320 MG/ML IV SOLN
INTRAVENOUS | Status: DC | PRN
  Administered 2017-12-16: 225 mL via INTRA_ARTERIAL

## 2017-12-16 MED ORDER — ONDANSETRON HCL 4 MG/2ML IJ SOLN: 4.0000 mg | Freq: Four times a day (QID) | INTRAMUSCULAR | Status: DC | PRN

## 2017-12-16 MED ORDER — FENTANYL CITRATE (PF) 100 MCG/2ML IJ SOLN
INTRAMUSCULAR | Status: DC | PRN
  Administered 2017-12-16: 25 ug via INTRAVENOUS

## 2017-12-16 MED ORDER — HEPARIN SODIUM (PORCINE) 1000 UNIT/ML IJ SOLN
INTRAMUSCULAR | Status: DC | PRN
  Administered 2017-12-16: 2000 [IU] via INTRAVENOUS

## 2017-12-16 MED ORDER — SODIUM CHLORIDE 0.9% FLUSH: 3.0000 mL | INTRAVENOUS | Status: DC | PRN

## 2017-12-16 MED ORDER — NITROGLYCERIN 1 MG/10 ML FOR IR/CATH LAB
INTRA_ARTERIAL | Status: DC | PRN
  Administered 2017-12-16: 200 ug via INTRA_ARTERIAL

## 2017-12-16 MED ORDER — HEPARIN SODIUM (PORCINE) 1000 UNIT/ML IJ SOLN
INTRAMUSCULAR | Status: AC
  Filled 2017-12-16: qty 1

## 2017-12-16 MED ORDER — ACETAMINOPHEN 325 MG PO TABS: 650.0000 mg | ORAL_TABLET | ORAL | Status: DC | PRN

## 2017-12-16 MED ORDER — HYDRALAZINE HCL 20 MG/ML IJ SOLN: 5.0000 mg | INTRAMUSCULAR | Status: DC | PRN

## 2017-12-16 MED ORDER — LIDOCAINE HCL (PF) 1 % IJ SOLN
INTRAMUSCULAR | Status: AC
  Filled 2017-12-16: qty 30

## 2017-12-16 MED ORDER — LABETALOL HCL 5 MG/ML IV SOLN: 10.0000 mg | INTRAVENOUS | Status: DC | PRN

## 2017-12-16 MED ORDER — SODIUM CHLORIDE 0.9 % IV SOLN: 250.0000 mL | INTRAVENOUS | Status: DC | PRN

## 2017-12-16 MED ORDER — HEPARIN (PORCINE) IN NACL 1000-0.9 UT/500ML-% IV SOLN
INTRAVENOUS | Status: AC
  Filled 2017-12-16: qty 1000

## 2017-12-16 MED ORDER — HEPARIN (PORCINE) IN NACL 2-0.9 UNITS/ML
INTRAMUSCULAR | Status: AC | PRN
  Administered 2017-12-16: 1000 mL via INTRA_ARTERIAL

## 2017-12-16 MED ORDER — NITROGLYCERIN 1 MG/10 ML FOR IR/CATH LAB
INTRA_ARTERIAL | Status: AC
  Filled 2017-12-16: qty 10

## 2017-12-16 SURGICAL SUPPLY — 16 items
CATH ANGIO 5F PIGTAIL 100CM (CATHETERS) ×3 IMPLANT
CATH BEACON 5 .035 65 C1 TIP (CATHETERS) ×3 IMPLANT
CATH OMNI FLUSH 5F 65CM (CATHETERS) ×3 IMPLANT
COVER PRB 48X5XTLSCP FOLD TPE (BAG) ×2 IMPLANT
COVER PROBE 5X48 (BAG) ×1
KIT MICROPUNCTURE NIT STIFF (SHEATH) ×3 IMPLANT
KIT PV (KITS) ×3 IMPLANT
SHEATH PINNACLE 5F 10CM (SHEATH) ×3 IMPLANT
STOPCOCK MORSE 400PSI 3WAY (MISCELLANEOUS) ×3 IMPLANT
SYR MEDRAD MARK V 150ML (SYRINGE) ×3 IMPLANT
TRANSDUCER W/STOPCOCK (MISCELLANEOUS) ×3 IMPLANT
TRAY PV CATH (CUSTOM PROCEDURE TRAY) ×3 IMPLANT
TUBING HIGH PRESSURE 120CM (CONNECTOR) ×3 IMPLANT
WIRE BENTSON .035X145CM (WIRE) ×3 IMPLANT
WIRE HI TORQ VERSACORE J 260CM (WIRE) ×3 IMPLANT
WIRE ROSEN-J .035X260CM (WIRE) ×3 IMPLANT

## 2017-12-16 NOTE — Interval H&P Note (Signed)
   History and Physical Update  The patient was interviewed and re-examined.  The patient's previous History and Physical has been reviewed and is unchanged from Dr. Luther Parody consult.  There is no change in the plan of care: left brachial artery cannulation, aortogram, and bilateral leg runoff.   I discussed with the patient the nature of angiographic procedures, especially the limited patencies of any endovascular intervention.    The patient is aware of that the risks of an angiographic procedure include but are not limited to: bleeding, infection, access site complications, renal failure, embolization, rupture of vessel, dissection, arteriovenous fistula, possible need for emergent surgical intervention, possible need for surgical procedures to treat the patient's pathology, anaphylactic reaction to contrast, and stroke and death.    The patient is aware of the risks and agrees to proceed.   Adele Barthel, MD, FACS [facility] of [place] Office: [phone] Pager: [phone]  12/16/2017, 9:21 AM

## 2017-12-16 NOTE — Op Note (Signed)
OPERATIVE NOTE   PROCEDURE: 1.  Left brachial artery cannulation under ultrasound guidance 2.  Placement of catheter in aorta 3.  Aortogram 4.  Bilateral leg runoff via catheter 5.  Intra-arterial administration of nitroglycerin  PRE-OPERATIVE DIAGNOSIS: bilateral intermittent claudication with left toe wounds  POST-OPERATIVE DIAGNOSIS: same as above   SURGEON: Adele Barthel, MD  ANESTHESIA: conscious sedation  ESTIMATED BLOOD LOSS: 50 cc  CONTRAST: 227 cc  FINDING(S):  Diffuse calcific atherosclerosis throughout  Aorta: patent, calcific atherosclerosis evident  Superior mesenteric artery: patent Celiac artery: patent   Right Left  RA Not seen Not seen  CIA Patent Patent  EIA Patent Patent  IIA Patent Patent, >75% stenosis evident  CFA Sub-total occlusion Occluded  SFA Patent with extensive proximal calcific disease evident, proximal 1/3 50-75% stenosis Occluded  PFA Patent, extensive collaterals Patent, extensive collaterals  Pop Patent Patent with sub-total occlusion of at-the-knee segment  Trif Patent Patent, underfilled  AT Patent Patent, underfilled  Pero Patent Patent, underfilled  PT Patent Patent, underfilled   SPECIMEN(S):  none  INDICATIONS:   Terry [name]. is a 83 y.o. male who presents with intermittent claudication and chronic left toe wounds.  The patient presents for: aortogram, bilateral leg runoff.  I discussed with the patient the nature of angiographic procedures, especially the limited patencies of any endovascular intervention.  The patient is aware of that the risks of an angiographic procedure include but are not limited to: bleeding, infection, access site complications, renal failure, embolization, rupture of vessel, dissection, possible need for emergent surgical intervention, possible need for surgical procedures to treat the patient's pathology, and stroke and death.  The patient is aware of the risks and agrees to  proceed.  DESCRIPTION: After full informed consent was obtained from the patient, the patient was brought back to the angiography suite.  The patient was placed supine upon the angiography table and connected to cardiopulmonary monitoring equipment.  The patient was then given no conscious sedation, as there was no way to get an accurate blood pressure initially.  A circulating radiologic technician maintained continuous monitoring of the patient's cardiopulmonary status.  Additionally, the control room radiologic technician provided backup monitoring throughout the procedure.  The patient was prepped and drape in the standard fashion for an angiographic procedure.  At this point, attention was turned to the left arm.    I injected 1% lidocaine without epinephrine in the skin overlying the left brachial artery.  Under Sonosite guidance, I cannulated the left brachial artery with a micropuncture needle.  I loaded the microwire into the left brachial artery.  I exchanged the needle for the microsheath.  I removed the dilator and wire.  I placed a Bentson wire into the axillary artery.  I exchanged the sheath for a 5-Fr sheath.  I gave 2000 units of Heparin with 200 mcg Nitroglycerin via the sheath.  I loaded the pigtail catheter over the wire, but I could not get into the descending thoracic artery.  I then tried an Omnilush catheter, but this also failed to allow cannulation of the thoracic aorta.  Finally, a C1 catheter with a Versacore wire was able to selected the descending thoracic aorta and advance down to the abdominal aorta.  The catheter was exchanged for a pigtail catheter, which was placed at T12.  The catheter was connected to the manifold and power injector circuit.  An intra-arterial blood pressure was obtained from the catheter.  An automated aortogram was completed.  I  then advanced the catheter down to the distal aorta.  LAO and RAO pelvic injection was was completed.  The findings are listed  above.  An automated bilateral leg runoff was completed.  Due to differences in flow rate between the two legs, I had to completed repeat injections at the proximal and distal calf levels.  The findings are listed above.   COMPLICATIONS: none  CONDITION: stable   Adele Barthel, MD, [facility] of [place] Office: [phone] Pager: [phone]  12/16/2017, 12:11 PM

## 2017-12-16 NOTE — Telephone Encounter (Signed)
sch appt spk to pt wife 01/03/18 10am wound check

## 2017-12-16 NOTE — Discharge Instructions (Signed)

## 2017-12-16 NOTE — Telephone Encounter (Signed)
sch appt lvm 01/04/18 345pm f/u MD

## 2017-12-17 ENCOUNTER — Encounter (HOSPITAL_COMMUNITY): Payer: Self-pay | Admitting: Vascular Surgery

## 2017-12-17 ENCOUNTER — Encounter (HOSPITAL_COMMUNITY): Payer: Medicare PPO

## 2017-12-17 ENCOUNTER — Encounter: Payer: Medicare PPO | Admitting: Vascular Surgery

## 2017-12-17 DIAGNOSIS — N2581 Secondary hyperparathyroidism of renal origin: Secondary | ICD-10-CM | POA: Diagnosis not present

## 2017-12-17 DIAGNOSIS — N186 End stage renal disease: Secondary | ICD-10-CM | POA: Diagnosis not present

## 2017-12-19 DIAGNOSIS — Z992 Dependence on renal dialysis: Secondary | ICD-10-CM | POA: Diagnosis not present

## 2017-12-19 DIAGNOSIS — N186 End stage renal disease: Secondary | ICD-10-CM | POA: Diagnosis not present

## 2017-12-19 DIAGNOSIS — E1122 Type 2 diabetes mellitus with diabetic chronic kidney disease: Secondary | ICD-10-CM | POA: Diagnosis not present

## 2017-12-20 DIAGNOSIS — N186 End stage renal disease: Secondary | ICD-10-CM | POA: Diagnosis not present

## 2017-12-20 DIAGNOSIS — N2581 Secondary hyperparathyroidism of renal origin: Secondary | ICD-10-CM | POA: Diagnosis not present

## 2017-12-21 ENCOUNTER — Ambulatory Visit (INDEPENDENT_AMBULATORY_CARE_PROVIDER_SITE_OTHER): Payer: Medicare PPO | Admitting: Vascular Surgery

## 2017-12-21 ENCOUNTER — Encounter: Payer: Self-pay | Admitting: *Deleted

## 2017-12-21 ENCOUNTER — Other Ambulatory Visit: Payer: Self-pay | Admitting: *Deleted

## 2017-12-21 ENCOUNTER — Other Ambulatory Visit: Payer: Self-pay

## 2017-12-21 ENCOUNTER — Encounter: Payer: Self-pay | Admitting: Vascular Surgery

## 2017-12-21 VITALS — BP 126/68 | HR 94 | Temp 96.7°F | Resp 20 | Ht 68.0 in | Wt 150.0 lb

## 2017-12-21 DIAGNOSIS — I739 Peripheral vascular disease, unspecified: Secondary | ICD-10-CM

## 2017-12-21 NOTE — Progress Notes (Signed)
Phone call to [facility] Ellison Hughs) that patient's HD days will need to be rescheduled to accommodate surgery for 12/29/17.

## 2017-12-21 NOTE — H&P (View-Only) (Signed)
Vascular and Vein Specialist of [place]  Patient name: Lenon Kuennen. MRN: [id number] DOB: 03-06-1935 Sex: male  REASON FOR VISIT: Here today for discussion of outpatient arteriogram last week  HPI: Jakhi Dishman. is a 83 y.o. male here today for follow-up.  He underwent aortogram with bilateral lower extremity runoff via brachial artery approach last week.  Is here for discussion.  He has no change in his left foot since my prior visit with him.  Does continue to have claudication but this is not limiting due to his severe COPD.  He does not have any rest pain in his left foot.  Past Medical History:  Diagnosis Date  . Acute and chronic respiratory failure 10/22/2012  . Anxiety   . Arthritis    "hands, knees" (03/15/2017)  . Atrial fibrillation ([place])   . CAD (coronary artery disease) [place], [name]  . CHF (congestive heart failure) ([place]) 09/2012  . COPD (chronic obstructive pulmonary disease) ([place])   . ESRD (end stage renal disease) on dialysis [facility])    "MWF; [facility], [place]" (03/15/2017)  . Flu 09/24/2016   influenza b  . HCAP (healthcare-associated pneumonia) 03/15/2017  . High cholesterol   . History of blood transfusion 2012   "related to bowel resection"  . Hypertension   . On home oxygen therapy    "1L typically at night" (03/15/2017)  . Osteoarthritis 10/22/2012  . Pneumonia April 2014; 09/2016  . Shortness of breath dyspnea   . Stroke [facility]) 2012   denies residual on 03/15/2017  . Stroke [facility]) ~ 2013   "small one in his right eye"    Family History  Problem Relation Age of Onset  . Cancer Mother        Breast  . Heart disease Father   . Heart attack Father   . Parkinson's disease Brother     SOCIAL HISTORY: Social History   Tobacco Use  . Smoking status: Former Smoker    Packs/day: 1.00    Years: 49.00    Pack years: 49.00    Types: Cigarettes, Pipe, Cigars    Last attempt to quit: 02/21/2000   Years since quitting: 17.8  . Smokeless tobacco: Former Systems developer    Types: Chew  . Tobacco comment: "chewed when he was a kid"  Substance Use Topics  . Alcohol use: Yes    Comment: 03/15/2017 "nothing since the 1960's"    Allergies  Allergen Reactions  . Yellow Dyes (Non-Tartrazine) Other (See Comments)    Burns arms; Cough medications (tussionex and benzonatate ok)  . Levaquin [Levofloxacin In D5w] Itching  . Levofloxacin Nausea And Vomiting    Current Outpatient Medications  Medication Sig Dispense Refill  . acetaminophen (TYLENOL) 500 MG tablet Take 1,000 mg by mouth every 6 (six) hours as needed for mild pain or headache.     . albuterol (PROVENTIL HFA;VENTOLIN HFA) 108 (90 Base) MCG/ACT inhaler Inhale 2 puffs into the lungs every 4 (four) hours as needed for wheezing or shortness of breath. 1 Inhaler 0  . albuterol (PROVENTIL) (2.5 MG/3ML) 0.083% nebulizer solution Take 3 mLs (2.5 mg total) by nebulization every 4 (four) hours as needed for wheezing or shortness of breath. 75 mL 0  . aspirin EC 81 MG tablet Take 81 mg by mouth daily.    [name] atorvastatin (LIPITOR) 80 MG tablet Take 1 tablet (80 mg total) by mouth daily. 90 tablet 3  . budesonide-formoterol (SYMBICORT) 160-4.5 MCG/ACT inhaler  Inhale 2 puffs into the lungs 2 (two) times daily.    . calcium acetate (PHOSLO) 667 MG capsule Take 667-1,334 mg by mouth See admin instructions. Take 1334 mg by mouth 3 times daily with each meal and take 667 mg by mouth with each snack    . cetirizine (ZYRTEC) 10 MG tablet Take 10 mg by mouth daily as needed for allergies.     [name] clopidogrel (PLAVIX) 75 MG tablet Take 1 tablet (75 mg total) by mouth daily. 90 tablet 3  . dextromethorphan-guaiFENesin (MUCINEX DM) 30-600 MG 12hr tablet Take 1 tablet by mouth 2 (two) times daily as needed for cough.    . ethyl chloride spray Apply 1 application topically every Monday, Wednesday, and Friday.     . famotidine (PEPCID) 20 MG tablet Take 1 tablet (20 mg  total) by mouth 2 (two) times daily as needed for heartburn or indigestion. 30 tablet 1  . fluconazole (DIFLUCAN) 50 MG tablet Take 50-100 mg by mouth See admin instructions. Take 100 mg by mouth on day 1, then take 50 mg by mouth daily thereafter    . Glycopyrrolate-Formoterol (BEVESPI AEROSPHERE) 9-4.8 MCG/ACT AERO Inhale 2 puffs into the lungs 2 (two) times daily. 1 Inhaler 11  . HYDROcodone-homatropine (HYCODAN) 5-1.5 MG/5ML syrup Take 5 mLs by mouth every 4 (four) hours as needed for cough. 120 mL 0  . magic mouthwash w/lidocaine SOLN Take 15 mLs by mouth 3 (three) times daily.    . methocarbamol (ROBAXIN) 500 MG tablet Take 500 mg by mouth 2 (two) times daily as needed for muscle spasms.    . metoprolol succinate (TOPROL-XL) 50 MG 24 hr tablet Take 50 mg by mouth 2 (two) times daily. Take with or immediately following a meal.     . midodrine (PROAMATINE) 10 MG tablet Take 10 mg by mouth See admin instructions. Take 10 mg by mouth on Mon/Wed/Fri at dialysis as needed for low blood pressure    . OXYGEN Inhale 1 L into the lungs daily as needed (for shortness of breath).     [name] oxymetazoline (AFRIN) 0.05 % nasal spray Place 1 spray into both nostrils once as needed (to stop nose bleeds).     . predniSONE (DELTASONE) 10 MG tablet Take 6 tablets (60 mg total) by mouth daily with breakfast. And decrease by one tablet daily, then continue Prednisone 10 mg po daily (Patient taking differently: Take 10 mg by mouth daily with breakfast. ) 21 tablet 0   No current facility-administered medications for this visit.     REVIEW OF SYSTEMS:  [X]  denotes positive finding, [ ]  denotes negative finding Cardiac  Comments:  Chest pain or chest pressure:    Shortness of breath upon exertion: x   Short of breath when lying flat: x   Irregular heart rhythm: x       Vascular    Pain in calf, thigh, or hip brought on by ambulation:    Pain in feet at night that wakes you up from your sleep:     Blood clot in  your veins:    Leg swelling:  x         PHYSICAL EXAM: Vitals:   12/21/17 0834  BP: 126/68  Pulse: 94  Resp: 20  Temp: (!) 96.7 F (35.9 C)  TempSrc: Oral  SpO2: 93%  Weight: 150 lb (68 kg)  Height: 5\' 8"  (1.727 m)    GENERAL: The patient is a well-nourished male, in no acute distress.  The vital signs are documented above. CARDIOVASCULAR: No palpable pulses bilaterally.  Significant swelling in both feet PULMONARY: There is good air exchange  MUSCULOSKELETAL: There are no major deformities or cyanosis. NEUROLOGIC: No focal weakness or paresthesias are detected. SKIN: There are no ulcers or rashes noted. PSYCHIATRIC: The patient has a normal affect.  DATA:  Reviewed his arteriogram discussed at length with patient and his wife present.  On the left he has no flow-limiting stenosis in his aortoiliac segments or on the right.  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  He does have profunda that reconstitutes via collaterals and then reconstituted distal superficial femoral and popliteal via profunda collaterals as well.  He has three-vessel runoff bilaterally.  On the right he does have patency of a diseased superficial femoral artery and high-grade stenosis of his common femoral artery  MEDICAL ISSUES: I discussed the significance of his left leg occlusive disease.  He has had a long nonhealing excoriation over all 5 toes of his left foot.  I do feel that this is limb threatening.  I discussed options of common femoral endarterectomy and then also the possibility of femoral to below-knee popliteal bypass.  I feel with his level of tissue loss that he should be able to heal his foot with flow in line to his profundal with collaterals to his popliteal.  He has severe comorbidities mainly with aortic stenosis and severe COPD.  I feel that his best option would be common femoral endarterectomy only and feel that he may be prohibitive  risk for femoral-popliteal bypass.  He understands and we will schedule this at his earliest convenience.  We will coordinate this around his dialysis schedule.    Rosetta Posner, MD FACS Vascular and Vein Specialists of [facility] Tel [phone] Pager [phone]

## 2017-12-21 NOTE — Progress Notes (Signed)
Vascular and Vein Specialist of   Patient name: Terry [name]. MRN: [id number] DOB: 06/01/1935 Sex: male  REASON FOR VISIT: Here today for discussion of outpatient arteriogram last week  HPI: Terry [name]. is a 83 y.o. male here today for follow-up.  He underwent aortogram with bilateral lower extremity runoff via brachial artery approach last week.  Is here for discussion.  He has no change in his left foot since my prior visit with him.  Does continue to have claudication but this is not limiting due to his severe COPD.  He does not have any rest pain in his left foot.  Past Medical History:  Diagnosis Date  . Acute and chronic respiratory failure 10/22/2012  . Anxiety   . Arthritis    "hands, knees" (03/15/2017)  . Atrial fibrillation ([place])   . CAD (coronary artery disease) [place], [name]  . CHF (congestive heart failure) ([place]) 09/2012  . COPD (chronic obstructive pulmonary disease) ([place])   . ESRD (end stage renal disease) on dialysis [facility])    "MWF; [facility], [place]" (03/15/2017)  . Flu 09/24/2016   influenza b  . HCAP (healthcare-associated pneumonia) 03/15/2017  . High cholesterol   . History of blood transfusion 2012   "related to bowel resection"  . Hypertension   . On home oxygen therapy    "1L typically at night" (03/15/2017)  . Osteoarthritis 10/22/2012  . Pneumonia April 2014; 09/2016  . Shortness of breath dyspnea   . Stroke [facility]) 2012   denies residual on 03/15/2017  . Stroke [facility]) ~ 2013   "small one in his right eye"    Family History  Problem Relation Age of Onset  . Cancer Mother        Breast  . Heart disease Father   . Heart attack Father   . Parkinson's disease Brother     SOCIAL HISTORY: Social History   Tobacco Use  . Smoking status: Former Smoker    Packs/day: 1.00    Years: 49.00    Pack years: 49.00    Types: Cigarettes, Pipe, Cigars    Last attempt to quit: 02/21/2000   Years since quitting: 17.8  . Smokeless tobacco: Former Systems developer    Types: Chew  . Tobacco comment: "chewed when he was a kid"  Substance Use Topics  . Alcohol use: Yes    Comment: 03/15/2017 "nothing since the 1960's"    Allergies  Allergen Reactions  . Yellow Dyes (Non-Tartrazine) Other (See Comments)    Burns arms; Cough medications (tussionex and benzonatate ok)  . Levaquin [Levofloxacin In D5w] Itching  . Levofloxacin Nausea And Vomiting    Current Outpatient Medications  Medication Sig Dispense Refill  . acetaminophen (TYLENOL) 500 MG tablet Take 1,000 mg by mouth every 6 (six) hours as needed for mild pain or headache.     . albuterol (PROVENTIL HFA;VENTOLIN HFA) 108 (90 Base) MCG/ACT inhaler Inhale 2 puffs into the lungs every 4 (four) hours as needed for wheezing or shortness of breath. 1 Inhaler 0  . albuterol (PROVENTIL) (2.5 MG/3ML) 0.083% nebulizer solution Take 3 mLs (2.5 mg total) by nebulization every 4 (four) hours as needed for wheezing or shortness of breath. 75 mL 0  . aspirin EC 81 MG tablet Take 81 mg by mouth daily.    [name] atorvastatin (LIPITOR) 80 MG tablet Take 1 tablet (80 mg total) by mouth daily. 90 tablet 3  . budesonide-formoterol (SYMBICORT) 160-4.5 MCG/ACT inhaler  Inhale 2 puffs into the lungs 2 (two) times daily.    . calcium acetate (PHOSLO) 667 MG capsule Take 667-1,334 mg by mouth See admin instructions. Take 1334 mg by mouth 3 times daily with each meal and take 667 mg by mouth with each snack    . cetirizine (ZYRTEC) 10 MG tablet Take 10 mg by mouth daily as needed for allergies.     [name] clopidogrel (PLAVIX) 75 MG tablet Take 1 tablet (75 mg total) by mouth daily. 90 tablet 3  . dextromethorphan-guaiFENesin (MUCINEX DM) 30-600 MG 12hr tablet Take 1 tablet by mouth 2 (two) times daily as needed for cough.    . ethyl chloride spray Apply 1 application topically every Monday, Wednesday, and Friday.     . famotidine (PEPCID) 20 MG tablet Take 1 tablet (20 mg  total) by mouth 2 (two) times daily as needed for heartburn or indigestion. 30 tablet 1  . fluconazole (DIFLUCAN) 50 MG tablet Take 50-100 mg by mouth See admin instructions. Take 100 mg by mouth on day 1, then take 50 mg by mouth daily thereafter    . Glycopyrrolate-Formoterol (BEVESPI AEROSPHERE) 9-4.8 MCG/ACT AERO Inhale 2 puffs into the lungs 2 (two) times daily. 1 Inhaler 11  . HYDROcodone-homatropine (HYCODAN) 5-1.5 MG/5ML syrup Take 5 mLs by mouth every 4 (four) hours as needed for cough. 120 mL 0  . magic mouthwash w/lidocaine SOLN Take 15 mLs by mouth 3 (three) times daily.    . methocarbamol (ROBAXIN) 500 MG tablet Take 500 mg by mouth 2 (two) times daily as needed for muscle spasms.    . metoprolol succinate (TOPROL-XL) 50 MG 24 hr tablet Take 50 mg by mouth 2 (two) times daily. Take with or immediately following a meal.     . midodrine (PROAMATINE) 10 MG tablet Take 10 mg by mouth See admin instructions. Take 10 mg by mouth on Mon/Wed/Fri at dialysis as needed for low blood pressure    . OXYGEN Inhale 1 L into the lungs daily as needed (for shortness of breath).     [name] oxymetazoline (AFRIN) 0.05 % nasal spray Place 1 spray into both nostrils once as needed (to stop nose bleeds).     . predniSONE (DELTASONE) 10 MG tablet Take 6 tablets (60 mg total) by mouth daily with breakfast. And decrease by one tablet daily, then continue Prednisone 10 mg po daily (Patient taking differently: Take 10 mg by mouth daily with breakfast. ) 21 tablet 0   No current facility-administered medications for this visit.     REVIEW OF SYSTEMS:  [X]  denotes positive finding, [ ]  denotes negative finding Cardiac  Comments:  Chest pain or chest pressure:    Shortness of breath upon exertion: x   Short of breath when lying flat: x   Irregular heart rhythm: x       Vascular    Pain in calf, thigh, or hip brought on by ambulation:    Pain in feet at night that wakes you up from your sleep:     Blood clot in  your veins:    Leg swelling:  x         PHYSICAL EXAM: Vitals:   12/21/17 0834  BP: 126/68  Pulse: 94  Resp: 20  Temp: (!) 96.7 F (35.9 C)  TempSrc: Oral  SpO2: 93%  Weight: 150 lb (68 kg)  Height: 5\' 8"  (1.727 m)    GENERAL: The patient is a well-nourished male, in no acute distress.  The vital signs are documented above. CARDIOVASCULAR: No palpable pulses bilaterally.  Significant swelling in both feet PULMONARY: There is good air exchange  MUSCULOSKELETAL: There are no major deformities or cyanosis. NEUROLOGIC: No focal weakness or paresthesias are detected. SKIN: There are no ulcers or rashes noted. PSYCHIATRIC: The patient has a normal affect.  DATA:  Reviewed his arteriogram discussed at length with patient and his wife present.  On the left he has no flow-limiting stenosis in his aortoiliac segments or on the right.  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  He does have profunda that reconstitutes via collaterals and then reconstituted distal superficial femoral and popliteal via profunda collaterals as well.  He has three-vessel runoff bilaterally.  On the right he does have patency of a diseased superficial femoral artery and high-grade stenosis of his common femoral artery  MEDICAL ISSUES: I discussed the significance of his left leg occlusive disease.  He has had a long nonhealing excoriation over all 5 toes of his left foot.  I do feel that this is limb threatening.  I discussed options of common femoral endarterectomy and then also the possibility of femoral to below-knee popliteal bypass.  I feel with his level of tissue loss that he should be able to heal his foot with flow in line to his profundal with collaterals to his popliteal.  He has severe comorbidities mainly with aortic stenosis and severe COPD.  I feel that his best option would be common femoral endarterectomy only and feel that he may be prohibitive  risk for femoral-popliteal bypass.  He understands and we will schedule this at his earliest convenience.  We will coordinate this around his dialysis schedule.    Rosetta Posner, MD FACS Vascular and Vein Specialists of [facility] Tel [phone] Pager [phone]

## 2017-12-22 DIAGNOSIS — N186 End stage renal disease: Secondary | ICD-10-CM | POA: Diagnosis not present

## 2017-12-22 DIAGNOSIS — N2581 Secondary hyperparathyroidism of renal origin: Secondary | ICD-10-CM | POA: Diagnosis not present

## 2017-12-24 DIAGNOSIS — N186 End stage renal disease: Secondary | ICD-10-CM | POA: Diagnosis not present

## 2017-12-24 DIAGNOSIS — Z992 Dependence on renal dialysis: Secondary | ICD-10-CM | POA: Diagnosis not present

## 2017-12-27 DIAGNOSIS — N2581 Secondary hyperparathyroidism of renal origin: Secondary | ICD-10-CM | POA: Diagnosis not present

## 2017-12-27 DIAGNOSIS — N186 End stage renal disease: Secondary | ICD-10-CM | POA: Diagnosis not present

## 2017-12-27 NOTE — Pre-Procedure Instructions (Signed)
[name]  12/27/2017      [place], [address] [place] [address] Phone: [phone] Fax: [phone]  [facility] [address] [name] - [place] [address] [name] [address] Phone: [phone] Fax: [phone]  [facility] [place], [address] [place] [address] Phone: [phone] Fax: [phone]    Your procedure is scheduled on July 10  Report to [facility] at 9:15 A.M.  Call this number if you have problems the morning of surgery:  [phone]   Remember:  Do not eat or drink after midnight.      Take these medicines the morning of surgery with A SIP OF WATER : tylenol if needed, albuterol inhaler if needed-bring to hospital, albuterol nebulizer, symbicort--bring to hospital, certirizine (zyrtec) if needed, mucinex, famotidine (pepcid) if needed, bevespi aeroshhere-bring to hospital, methocarbamol (robaxin) if needed,metoprolol succinate (toprol-xl), prednisone (deltasone )             7 days prior to surgery STOP taking  Aleve, Naproxen, Ibuprofen, Motrin, Advil, Goody's, BC's, all herbal medications, fish oil, and all vitamins             Follow your doctors instructions regarding your Aspirin.  If no instructions were given by your doctor, then you will need to call the prescribing office office to get instructions.       Do not wear jewelry.  Do not wear lotions, powders, or perfumes, or deodorant.  Do not shave 48 hours prior to surgery.  Men may shave face and neck.  Do not bring valuables to the hospital.  [facility] is not responsible for any belongings or valuables.  Contacts, dentures or bridgework may not be worn into surgery.  Leave your suitcase in the car.  After surgery it may be brought to your room.  For patients admitted to the hospital, discharge time will be  determined by your treatment team.  Patients discharged the day of surgery will not be allowed to drive home.    Special instructions:  [place]- Preparing For Surgery  Before surgery, you can play an important role. Because skin is not sterile, your skin needs to be as free of germs as possible. You can reduce the number of germs on your skin by washing with CHG (chlorahexidine gluconate) Soap before surgery.  CHG is an antiseptic cleaner which kills germs and bonds with the skin to continue killing germs even after washing.    Oral Hygiene is also important to reduce your risk of infection.  Remember - BRUSH YOUR TEETH THE MORNING OF SURGERY WITH YOUR REGULAR TOOTHPASTE  Please do not use if you have an allergy to CHG or antibacterial soaps. If your skin becomes reddened/irritated stop using the CHG.  Do not shave (including legs and underarms) for at least 48 hours prior to first CHG shower. It is OK to shave your face.  Please follow these instructions carefully.   1. Shower the NIGHT BEFORE SURGERY and the MORNING OF SURGERY with CHG.   2. If you chose to wash your hair, wash your hair first as usual with your normal shampoo.  3. After you shampoo, rinse your hair and body thoroughly to remove the shampoo.  4. Use CHG as you would any other liquid soap. You can apply CHG directly to the skin  and wash gently with a scrungie or a clean washcloth.   5. Apply the CHG Soap to your body ONLY FROM THE NECK DOWN.  Do not use on open wounds or open sores. Avoid contact with your eyes, ears, mouth and genitals (private parts). Wash Face and genitals (private parts)  with your normal soap.  6. Wash thoroughly, paying special attention to the area where your surgery will be performed.  7. Thoroughly rinse your body with warm water from the neck down.  8. DO NOT shower/wash with your normal soap after using and rinsing off the CHG Soap.  9. Pat yourself dry with a CLEAN TOWEL.  10. Wear  CLEAN PAJAMAS to bed the night before surgery, wear comfortable clothes the morning of surgery  11. Place CLEAN SHEETS on your bed the night of your first shower and DO NOT SLEEP WITH PETS.    Day of Surgery:  Do not apply any deodorants/lotions.  Please wear clean clothes to the hospital/surgery center.   Remember to brush your teeth WITH YOUR REGULAR TOOTHPASTE.    Please read over the following fact sheets that you were given. Coughing and Deep Breathing, MRSA Information and Surgical Site Infection Prevention

## 2017-12-28 ENCOUNTER — Encounter (HOSPITAL_COMMUNITY): Payer: Self-pay

## 2017-12-28 ENCOUNTER — Other Ambulatory Visit: Payer: Self-pay

## 2017-12-28 ENCOUNTER — Encounter (HOSPITAL_COMMUNITY)
Admission: RE | Admit: 2017-12-28 | Discharge: 2017-12-28 | Disposition: A | Discharge status: Home / Self Care | Payer: Medicare PPO | Source: Ambulatory Visit | Attending: Vascular Surgery | Admitting: Vascular Surgery

## 2017-12-28 ENCOUNTER — Encounter (HOSPITAL_COMMUNITY)
Admission: RE | Admit: 2017-12-28 | Discharge: 2017-12-28 | Disposition: A | Discharge status: Home / Self Care | Payer: Medicare PPO | Source: Ambulatory Visit | Attending: Emergency Medicine | Admitting: Emergency Medicine

## 2017-12-28 DIAGNOSIS — I120 Hypertensive chronic kidney disease with stage 5 chronic kidney disease or end stage renal disease: Secondary | ICD-10-CM

## 2017-12-28 DIAGNOSIS — I110 Hypertensive heart disease with heart failure: Secondary | ICD-10-CM | POA: Insufficient documentation

## 2017-12-28 DIAGNOSIS — Z01812 Encounter for preprocedural laboratory examination: Secondary | ICD-10-CM | POA: Insufficient documentation

## 2017-12-28 DIAGNOSIS — Z9889 Other specified postprocedural states: Secondary | ICD-10-CM | POA: Diagnosis not present

## 2017-12-28 DIAGNOSIS — I998 Other disorder of circulatory system: Secondary | ICD-10-CM | POA: Diagnosis present

## 2017-12-28 DIAGNOSIS — Z8249 Family history of ischemic heart disease and other diseases of the circulatory system: Secondary | ICD-10-CM | POA: Insufficient documentation

## 2017-12-28 DIAGNOSIS — N186 End stage renal disease: Secondary | ICD-10-CM

## 2017-12-28 DIAGNOSIS — Z7951 Long term (current) use of inhaled steroids: Secondary | ICD-10-CM

## 2017-12-28 DIAGNOSIS — Z992 Dependence on renal dialysis: Secondary | ICD-10-CM | POA: Diagnosis not present

## 2017-12-28 DIAGNOSIS — Z87891 Personal history of nicotine dependence: Secondary | ICD-10-CM

## 2017-12-28 DIAGNOSIS — I480 Paroxysmal atrial fibrillation: Secondary | ICD-10-CM | POA: Diagnosis present

## 2017-12-28 DIAGNOSIS — I509 Heart failure, unspecified: Secondary | ICD-10-CM

## 2017-12-28 DIAGNOSIS — E785 Hyperlipidemia, unspecified: Secondary | ICD-10-CM | POA: Diagnosis present

## 2017-12-28 DIAGNOSIS — Z79899 Other long term (current) drug therapy: Secondary | ICD-10-CM | POA: Diagnosis not present

## 2017-12-28 DIAGNOSIS — Z7982 Long term (current) use of aspirin: Secondary | ICD-10-CM

## 2017-12-28 DIAGNOSIS — I700 Atherosclerosis of aorta: Secondary | ICD-10-CM | POA: Insufficient documentation

## 2017-12-28 DIAGNOSIS — I132 Hypertensive heart and chronic kidney disease with heart failure and with stage 5 chronic kidney disease, or end stage renal disease: Secondary | ICD-10-CM | POA: Diagnosis present

## 2017-12-28 DIAGNOSIS — Z955 Presence of coronary angioplasty implant and graft: Secondary | ICD-10-CM | POA: Diagnosis not present

## 2017-12-28 DIAGNOSIS — F419 Anxiety disorder, unspecified: Secondary | ICD-10-CM | POA: Insufficient documentation

## 2017-12-28 DIAGNOSIS — Z01818 Encounter for other preprocedural examination: Secondary | ICD-10-CM | POA: Insufficient documentation

## 2017-12-28 DIAGNOSIS — J449 Chronic obstructive pulmonary disease, unspecified: Secondary | ICD-10-CM

## 2017-12-28 DIAGNOSIS — I5041 Acute combined systolic (congestive) and diastolic (congestive) heart failure: Secondary | ICD-10-CM | POA: Diagnosis not present

## 2017-12-28 DIAGNOSIS — I739 Peripheral vascular disease, unspecified: Secondary | ICD-10-CM | POA: Diagnosis not present

## 2017-12-28 DIAGNOSIS — Z8673 Personal history of transient ischemic attack (TIA), and cerebral infarction without residual deficits: Secondary | ICD-10-CM

## 2017-12-28 DIAGNOSIS — J9610 Chronic respiratory failure, unspecified whether with hypoxia or hypercapnia: Secondary | ICD-10-CM | POA: Diagnosis present

## 2017-12-28 DIAGNOSIS — Z7902 Long term (current) use of antithrombotics/antiplatelets: Secondary | ICD-10-CM | POA: Diagnosis not present

## 2017-12-28 DIAGNOSIS — I2510 Atherosclerotic heart disease of native coronary artery without angina pectoris: Secondary | ICD-10-CM | POA: Diagnosis present

## 2017-12-28 DIAGNOSIS — Z9049 Acquired absence of other specified parts of digestive tract: Secondary | ICD-10-CM | POA: Diagnosis not present

## 2017-12-28 DIAGNOSIS — I350 Nonrheumatic aortic (valve) stenosis: Secondary | ICD-10-CM | POA: Diagnosis present

## 2017-12-28 DIAGNOSIS — E7800 Pure hypercholesterolemia, unspecified: Secondary | ICD-10-CM | POA: Diagnosis present

## 2017-12-28 DIAGNOSIS — Z803 Family history of malignant neoplasm of breast: Secondary | ICD-10-CM | POA: Insufficient documentation

## 2017-12-28 DIAGNOSIS — Z9981 Dependence on supplemental oxygen: Secondary | ICD-10-CM | POA: Diagnosis not present

## 2017-12-28 DIAGNOSIS — I959 Hypotension, unspecified: Secondary | ICD-10-CM | POA: Diagnosis not present

## 2017-12-28 LAB — CBC
HCT: 36.5 % — ABNORMAL LOW (ref 39.0–52.0)
Hemoglobin: 10.6 g/dL — ABNORMAL LOW (ref 13.0–17.0)
MCH: 29.6 pg (ref 26.0–34.0)
MCHC: 29 g/dL — ABNORMAL LOW (ref 30.0–36.0)
MCV: 102 fL — ABNORMAL HIGH (ref 78.0–100.0)
PLATELETS: 233 10*3/uL (ref 150–400)
RBC: 3.58 MIL/uL — ABNORMAL LOW (ref 4.22–5.81)
RDW: 21.3 % — ABNORMAL HIGH (ref 11.5–15.5)
WBC: 7 10*3/uL (ref 4.0–10.5)

## 2017-12-28 LAB — PROTIME-INR
INR: 1.06
PROTHROMBIN TIME: 13.7 s (ref 11.4–15.2)

## 2017-12-28 LAB — COMPREHENSIVE METABOLIC PANEL
ALT: 22 U/L (ref 0–44)
AST: 22 U/L (ref 15–41)
Albumin: 3.1 g/dL — ABNORMAL LOW (ref 3.5–5.0)
Alkaline Phosphatase: 70 U/L (ref 38–126)
Anion gap: 15 (ref 5–15)
BUN: 40 mg/dL — ABNORMAL HIGH (ref 8–23)
CALCIUM: 8.7 mg/dL — AB (ref 8.9–10.3)
CHLORIDE: 92 mmol/L — AB (ref 98–111)
CO2: 31 mmol/L (ref 22–32)
CREATININE: 4.8 mg/dL — AB (ref 0.61–1.24)
GFR, EST AFRICAN AMERICAN: 12 mL/min — AB (ref >60)
GFR, EST NON AFRICAN AMERICAN: 10 mL/min — AB (ref >60)
Glucose, Bld: 199 mg/dL — ABNORMAL HIGH (ref 70–99)
Potassium: 3.9 mmol/L (ref 3.5–5.1)
Sodium: 138 mmol/L (ref 135–145)
Total Bilirubin: 1.2 mg/dL (ref 0.3–1.2)
Total Protein: 6.1 g/dL — ABNORMAL LOW (ref 6.5–8.1)

## 2017-12-28 LAB — TYPE AND SCREEN
ABO/RH(D): A POS
Antibody Screen: NEGATIVE

## 2017-12-28 LAB — APTT: APTT: 31 s (ref 24–36)

## 2017-12-28 NOTE — Progress Notes (Addendum)
PCP - Dr. Colin Benton  Cardiologist - Dr. [name]  Chest x-ray - 12/28/17  EKG - 11/30/17 (E)  Stress Test - 10/25/17 (CE)  ECHO - 10/25/17 (E)  Cardiac Cath - 09/10/17 (CE)  Sleep Study - Denies CPAP - None Pt uses continuous O2 at 2L  LABS- 12/28/17: CBC, CMP, PT, PTT, T/S  ASA- Continue Plavix- LD- 7/4  Pt has dialysis M-W-F   Anesthesia- Yes- Cardiac historyLevada Dy, NP fro anesthesia made aware  Pt denies having chest pain, sob, or fever at this time. All instructions explained to the pt, with a verbal understanding of the material. Pt agrees to go over the instructions while at home for a better understanding. The opportunity to ask questions was provided.

## 2017-12-29 ENCOUNTER — Other Ambulatory Visit: Payer: Self-pay

## 2017-12-29 ENCOUNTER — Inpatient Hospital Stay (HOSPITAL_COMMUNITY): Payer: Medicare PPO | Admitting: Certified Registered Nurse Anesthetist

## 2017-12-29 ENCOUNTER — Inpatient Hospital Stay (HOSPITAL_COMMUNITY)
Admission: RE | Admit: 2017-12-29 | Discharge: 2017-12-31 | DRG: 252 | Disposition: A | Discharge status: Home Health | Payer: Medicare PPO | Source: Ambulatory Visit | Attending: Vascular Surgery | Admitting: Vascular Surgery

## 2017-12-29 ENCOUNTER — Encounter (HOSPITAL_COMMUNITY)
Admission: RE | Disposition: A | Discharge status: Home Health | Payer: Self-pay | Source: Ambulatory Visit | Attending: Vascular Surgery

## 2017-12-29 ENCOUNTER — Encounter (HOSPITAL_COMMUNITY): Payer: Self-pay | Admitting: Certified Registered Nurse Anesthetist

## 2017-12-29 DIAGNOSIS — Z7982 Long term (current) use of aspirin: Secondary | ICD-10-CM | POA: Diagnosis not present

## 2017-12-29 DIAGNOSIS — Z992 Dependence on renal dialysis: Secondary | ICD-10-CM

## 2017-12-29 DIAGNOSIS — E785 Hyperlipidemia, unspecified: Secondary | ICD-10-CM | POA: Diagnosis present

## 2017-12-29 DIAGNOSIS — Z7902 Long term (current) use of antithrombotics/antiplatelets: Secondary | ICD-10-CM

## 2017-12-29 DIAGNOSIS — I350 Nonrheumatic aortic (valve) stenosis: Secondary | ICD-10-CM | POA: Diagnosis present

## 2017-12-29 DIAGNOSIS — I480 Paroxysmal atrial fibrillation: Secondary | ICD-10-CM | POA: Diagnosis present

## 2017-12-29 DIAGNOSIS — J449 Chronic obstructive pulmonary disease, unspecified: Secondary | ICD-10-CM | POA: Diagnosis present

## 2017-12-29 DIAGNOSIS — Z79899 Other long term (current) drug therapy: Secondary | ICD-10-CM

## 2017-12-29 DIAGNOSIS — I2510 Atherosclerotic heart disease of native coronary artery without angina pectoris: Secondary | ICD-10-CM | POA: Diagnosis not present

## 2017-12-29 DIAGNOSIS — E7800 Pure hypercholesterolemia, unspecified: Secondary | ICD-10-CM | POA: Diagnosis not present

## 2017-12-29 DIAGNOSIS — Z955 Presence of coronary angioplasty implant and graft: Secondary | ICD-10-CM | POA: Diagnosis not present

## 2017-12-29 DIAGNOSIS — J9610 Chronic respiratory failure, unspecified whether with hypoxia or hypercapnia: Secondary | ICD-10-CM | POA: Diagnosis not present

## 2017-12-29 DIAGNOSIS — Z9889 Other specified postprocedural states: Secondary | ICD-10-CM | POA: Diagnosis not present

## 2017-12-29 DIAGNOSIS — Z8673 Personal history of transient ischemic attack (TIA), and cerebral infarction without residual deficits: Secondary | ICD-10-CM | POA: Diagnosis not present

## 2017-12-29 DIAGNOSIS — Z9981 Dependence on supplemental oxygen: Secondary | ICD-10-CM | POA: Diagnosis not present

## 2017-12-29 DIAGNOSIS — Z87891 Personal history of nicotine dependence: Secondary | ICD-10-CM | POA: Diagnosis not present

## 2017-12-29 DIAGNOSIS — I998 Other disorder of circulatory system: Secondary | ICD-10-CM | POA: Diagnosis present

## 2017-12-29 DIAGNOSIS — N186 End stage renal disease: Secondary | ICD-10-CM | POA: Diagnosis present

## 2017-12-29 DIAGNOSIS — I739 Peripheral vascular disease, unspecified: Principal | ICD-10-CM | POA: Diagnosis present

## 2017-12-29 DIAGNOSIS — I132 Hypertensive heart and chronic kidney disease with heart failure and with stage 5 chronic kidney disease, or end stage renal disease: Secondary | ICD-10-CM | POA: Diagnosis not present

## 2017-12-29 DIAGNOSIS — Z9049 Acquired absence of other specified parts of digestive tract: Secondary | ICD-10-CM

## 2017-12-29 DIAGNOSIS — Z7951 Long term (current) use of inhaled steroids: Secondary | ICD-10-CM

## 2017-12-29 DIAGNOSIS — I509 Heart failure, unspecified: Secondary | ICD-10-CM | POA: Diagnosis present

## 2017-12-29 HISTORY — PX: ENDARTERECTOMY FEMORAL: SHX5804

## 2017-12-29 LAB — GLUCOSE, CAPILLARY: Glucose-Capillary: 122 mg/dL — ABNORMAL HIGH (ref 70–99)

## 2017-12-29 LAB — CBC
HEMATOCRIT: 34.2 % — AB (ref 39.0–52.0)
HEMOGLOBIN: 9.9 g/dL — AB (ref 13.0–17.0)
MCH: 29.8 pg (ref 26.0–34.0)
MCHC: 28.9 g/dL — ABNORMAL LOW (ref 30.0–36.0)
MCV: 103 fL — ABNORMAL HIGH (ref 78.0–100.0)
Platelets: 225 10*3/uL (ref 150–400)
RBC: 3.32 MIL/uL — AB (ref 4.22–5.81)
RDW: 21.4 % — ABNORMAL HIGH (ref 11.5–15.5)
WBC: 8.4 10*3/uL (ref 4.0–10.5)

## 2017-12-29 LAB — CREATININE, SERUM
Creatinine, Ser: 7.05 mg/dL — ABNORMAL HIGH (ref 0.61–1.24)
GFR calc Af Amer: 8 mL/min — ABNORMAL LOW (ref >60)
GFR, EST NON AFRICAN AMERICAN: 7 mL/min — AB (ref >60)

## 2017-12-29 LAB — POCT I-STAT 4, (NA,K, GLUC, HGB,HCT)
GLUCOSE: 83 mg/dL (ref 70–99)
HEMATOCRIT: 36 % — AB (ref 39.0–52.0)
Hemoglobin: 12.2 g/dL — ABNORMAL LOW (ref 13.0–17.0)
POTASSIUM: 3.9 mmol/L (ref 3.5–5.1)
SODIUM: 136 mmol/L (ref 135–145)

## 2017-12-29 LAB — SURGICAL PCR SCREEN
MRSA, PCR: POSITIVE — AB
STAPHYLOCOCCUS AUREUS: POSITIVE — AB

## 2017-12-29 SURGERY — ENDARTERECTOMY, FEMORAL
Anesthesia: General | Site: Groin | Laterality: Left

## 2017-12-29 MED ORDER — PROPOFOL 10 MG/ML IV BOLUS
INTRAVENOUS | Status: DC | PRN
  Administered 2017-12-29: 50 mg via INTRAVENOUS
  Administered 2017-12-29: 100 mg via INTRAVENOUS

## 2017-12-29 MED ORDER — IPRATROPIUM-ALBUTEROL 0.5-2.5 (3) MG/3ML IN SOLN
3.0000 mL | Freq: Three times a day (TID) | RESPIRATORY_TRACT | Status: DC
  Administered 2017-12-30 – 2017-12-31 (×4): 3 mL via RESPIRATORY_TRACT
  Filled 2017-12-29 (×5): qty 3

## 2017-12-29 MED ORDER — SODIUM CHLORIDE 0.9 % IV SOLN
INTRAVENOUS | Status: AC
  Filled 2017-12-29: qty 1.2

## 2017-12-29 MED ORDER — CALCIUM ACETATE (PHOS BINDER) 667 MG PO CAPS
667.0000 mg | ORAL_CAPSULE | ORAL | Status: DC
  Administered 2017-12-30 (×2): 667 mg via ORAL
  Filled 2017-12-29 (×2): qty 1

## 2017-12-29 MED ORDER — FENTANYL CITRATE (PF) 100 MCG/2ML IJ SOLN: 25.0000 ug | INTRAMUSCULAR | Status: DC | PRN

## 2017-12-29 MED ORDER — ACETAMINOPHEN 325 MG RE SUPP: 325.0000 mg | RECTAL | Status: DC | PRN

## 2017-12-29 MED ORDER — MUPIROCIN 2 % EX OINT
TOPICAL_OINTMENT | CUTANEOUS | Status: AC
  Administered 2017-12-30: 1 via NASAL
  Filled 2017-12-29: qty 22

## 2017-12-29 MED ORDER — FENTANYL CITRATE (PF) 100 MCG/2ML IJ SOLN
INTRAMUSCULAR | Status: DC | PRN
  Administered 2017-12-29: 50 ug via INTRAVENOUS

## 2017-12-29 MED ORDER — PROPOFOL 10 MG/ML IV BOLUS
INTRAVENOUS | Status: AC
  Filled 2017-12-29: qty 20

## 2017-12-29 MED ORDER — CHLORHEXIDINE GLUCONATE CLOTH 2 % EX PADS
6.0000 | MEDICATED_PAD | Freq: Every day | CUTANEOUS | Status: DC
  Administered 2017-12-31: 6 via TOPICAL

## 2017-12-29 MED ORDER — HEPARIN SODIUM (PORCINE) 5000 UNIT/ML IJ SOLN
5000.0000 [IU] | Freq: Three times a day (TID) | INTRAMUSCULAR | Status: DC
  Administered 2017-12-30: 5000 [IU] via SUBCUTANEOUS
  Filled 2017-12-29 (×2): qty 1

## 2017-12-29 MED ORDER — ONDANSETRON HCL 4 MG/2ML IJ SOLN: 4.0000 mg | Freq: Four times a day (QID) | INTRAMUSCULAR | Status: DC | PRN

## 2017-12-29 MED ORDER — MAGNESIUM SULFATE 2 GM/50ML IV SOLN: 2.0000 g | Freq: Every day | INTRAVENOUS | Status: DC | PRN

## 2017-12-29 MED ORDER — CEFAZOLIN SODIUM-DEXTROSE 2-4 GM/100ML-% IV SOLN
2.0000 g | Freq: Three times a day (TID) | INTRAVENOUS | Status: AC
  Administered 2017-12-29 – 2017-12-30 (×2): 2 g via INTRAVENOUS
  Filled 2017-12-29 (×2): qty 100

## 2017-12-29 MED ORDER — MUPIROCIN 2 % EX OINT
1.0000 "application " | TOPICAL_OINTMENT | Freq: Two times a day (BID) | CUTANEOUS | Status: DC
  Administered 2017-12-30 – 2017-12-31 (×3): 1 via NASAL
  Filled 2017-12-29 (×3): qty 22

## 2017-12-29 MED ORDER — MUPIROCIN 2 % EX OINT
1.0000 "application " | TOPICAL_OINTMENT | Freq: Once | CUTANEOUS | Status: AC
  Administered 2017-12-29: 1 via TOPICAL

## 2017-12-29 MED ORDER — HEPARIN SODIUM (PORCINE) 1000 UNIT/ML IJ SOLN
INTRAMUSCULAR | Status: DC | PRN
  Administered 2017-12-29: 7000 [IU] via INTRAVENOUS

## 2017-12-29 MED ORDER — ONDANSETRON HCL 4 MG/2ML IJ SOLN
INTRAMUSCULAR | Status: DC | PRN
  Administered 2017-12-29: 4 mg via INTRAVENOUS

## 2017-12-29 MED ORDER — IPRATROPIUM-ALBUTEROL 0.5-2.5 (3) MG/3ML IN SOLN
3.0000 mL | Freq: Two times a day (BID) | RESPIRATORY_TRACT | Status: DC
  Administered 2017-12-29: 3 mL via RESPIRATORY_TRACT
  Filled 2017-12-29: qty 3

## 2017-12-29 MED ORDER — FENTANYL CITRATE (PF) 250 MCG/5ML IJ SOLN
INTRAMUSCULAR | Status: AC
  Filled 2017-12-29: qty 5

## 2017-12-29 MED ORDER — CEFAZOLIN SODIUM-DEXTROSE 2-4 GM/100ML-% IV SOLN
2.0000 g | INTRAVENOUS | Status: AC
  Administered 2017-12-29: 2 g via INTRAVENOUS

## 2017-12-29 MED ORDER — ONDANSETRON HCL 4 MG/2ML IJ SOLN: 4.0000 mg | Freq: Once | INTRAMUSCULAR | Status: DC | PRN

## 2017-12-29 MED ORDER — MIDODRINE HCL 5 MG PO TABS
10.0000 mg | ORAL_TABLET | ORAL | Status: DC
  Administered 2017-12-31: 10 mg via ORAL
  Filled 2017-12-29: qty 2

## 2017-12-29 MED ORDER — 0.9 % SODIUM CHLORIDE (POUR BTL) OPTIME
TOPICAL | Status: DC | PRN
  Administered 2017-12-29: 2000 mL

## 2017-12-29 MED ORDER — LABETALOL HCL 5 MG/ML IV SOLN: 10.0000 mg | INTRAVENOUS | Status: DC | PRN

## 2017-12-29 MED ORDER — PHENYLEPHRINE 40 MCG/ML (10ML) SYRINGE FOR IV PUSH (FOR BLOOD PRESSURE SUPPORT)
PREFILLED_SYRINGE | INTRAVENOUS | Status: DC | PRN
  Administered 2017-12-29: 80 ug via INTRAVENOUS
  Administered 2017-12-29: 120 ug via INTRAVENOUS

## 2017-12-29 MED ORDER — SODIUM CHLORIDE 0.9 % IV SOLN
INTRAVENOUS | Status: DC
  Administered 2017-12-29: 11:00:00 via INTRAVENOUS

## 2017-12-29 MED ORDER — SODIUM CHLORIDE 0.9 % IV SOLN
500.0000 mL | Freq: Once | INTRAVENOUS | Status: AC | PRN
  Administered 2017-12-29: 250 mL via INTRAVENOUS

## 2017-12-29 MED ORDER — MORPHINE SULFATE (PF) 2 MG/ML IV SOLN: 2.0000 mg | INTRAVENOUS | Status: DC | PRN

## 2017-12-29 MED ORDER — ACETAMINOPHEN 325 MG PO TABS: 325.0000 mg | ORAL_TABLET | ORAL | Status: DC | PRN

## 2017-12-29 MED ORDER — BUPIVACAINE-EPINEPHRINE (PF) 0.5% -1:200000 IJ SOLN
INTRAMUSCULAR | Status: DC | PRN
  Administered 2017-12-29: 5 mL

## 2017-12-29 MED ORDER — LIDOCAINE HCL (CARDIAC) PF 100 MG/5ML IV SOSY
PREFILLED_SYRINGE | INTRAVENOUS | Status: DC | PRN
  Administered 2017-12-29: 40 mg via INTRAVENOUS
  Administered 2017-12-29: 60 mg via INTRAVENOUS

## 2017-12-29 MED ORDER — LIDOCAINE-EPINEPHRINE 0.5 %-1:200000 IJ SOLN
INTRAMUSCULAR | Status: AC
  Filled 2017-12-29: qty 1

## 2017-12-29 MED ORDER — METOPROLOL TARTRATE 5 MG/5ML IV SOLN: 2.0000 mg | INTRAVENOUS | Status: DC | PRN

## 2017-12-29 MED ORDER — PROTAMINE SULFATE 10 MG/ML IV SOLN
INTRAVENOUS | Status: DC | PRN
  Administered 2017-12-29: 50 mg via INTRAVENOUS

## 2017-12-29 MED ORDER — DEXAMETHASONE SODIUM PHOSPHATE 10 MG/ML IJ SOLN
INTRAMUSCULAR | Status: DC | PRN
  Administered 2017-12-29: 10 mg via INTRAVENOUS

## 2017-12-29 MED ORDER — SODIUM CHLORIDE 0.9 % IV SOLN
INTRAVENOUS | Status: DC | PRN
  Administered 2017-12-29: 25 ug/min via INTRAVENOUS

## 2017-12-29 MED ORDER — LIDOCAINE 2% (20 MG/ML) 5 ML SYRINGE
INTRAMUSCULAR | Status: AC
  Filled 2017-12-29: qty 5

## 2017-12-29 MED ORDER — CHLORHEXIDINE GLUCONATE 4 % EX LIQD: 60.0000 mL | Freq: Once | CUTANEOUS | Status: DC

## 2017-12-29 MED ORDER — CEFAZOLIN SODIUM-DEXTROSE 2-4 GM/100ML-% IV SOLN
INTRAVENOUS | Status: AC
  Filled 2017-12-29: qty 100

## 2017-12-29 MED ORDER — OXYCODONE-ACETAMINOPHEN 5-325 MG PO TABS
1.0000 | ORAL_TABLET | ORAL | Status: DC | PRN
  Administered 2017-12-29 – 2017-12-30 (×3): 1 via ORAL
  Administered 2017-12-30 – 2017-12-31 (×2): 2 via ORAL
  Administered 2017-12-31: 1 via ORAL
  Filled 2017-12-29 (×2): qty 1
  Filled 2017-12-29: qty 2
  Filled 2017-12-29 (×2): qty 1
  Filled 2017-12-29: qty 2

## 2017-12-29 MED ORDER — VANCOMYCIN HCL IN DEXTROSE 1-5 GM/200ML-% IV SOLN
INTRAVENOUS | Status: AC
  Filled 2017-12-29: qty 200

## 2017-12-29 MED ORDER — CALCIUM ACETATE (PHOS BINDER) 667 MG PO CAPS
1334.0000 mg | ORAL_CAPSULE | Freq: Three times a day (TID) | ORAL | Status: DC
  Administered 2017-12-30 – 2017-12-31 (×3): 1334 mg via ORAL
  Filled 2017-12-29 (×4): qty 2

## 2017-12-29 MED ORDER — SODIUM CHLORIDE 0.9 % IV SOLN
INTRAVENOUS | Status: DC
  Administered 2017-12-29: 10:00:00 via INTRAVENOUS

## 2017-12-29 MED ORDER — HYDRALAZINE HCL 20 MG/ML IJ SOLN: 5.0000 mg | INTRAMUSCULAR | Status: DC | PRN

## 2017-12-29 MED ORDER — GUAIFENESIN-DM 100-10 MG/5ML PO SYRP: 15.0000 mL | ORAL_SOLUTION | ORAL | Status: DC | PRN

## 2017-12-29 MED ORDER — SODIUM CHLORIDE 0.9 % IV SOLN
INTRAVENOUS | Status: DC | PRN
  Administered 2017-12-29: 500 mL

## 2017-12-29 MED ORDER — VANCOMYCIN HCL IN DEXTROSE 1-5 GM/200ML-% IV SOLN
1000.0000 mg | INTRAVENOUS | Status: AC
  Administered 2017-12-29: 1000 mg via INTRAVENOUS

## 2017-12-29 SURGICAL SUPPLY — 37 items
CANISTER SUCT 3000ML PPV (MISCELLANEOUS) ×2 IMPLANT
CANNULA VESSEL 3MM 2 BLNT TIP (CANNULA) ×2 IMPLANT
CLIP LIGATING EXTRA MED SLVR (CLIP) ×2 IMPLANT
CLIP LIGATING EXTRA SM BLUE (MISCELLANEOUS) ×2 IMPLANT
DERMABOND ADVANCED (GAUZE/BANDAGES/DRESSINGS) ×1
DERMABOND ADVANCED .7 DNX12 (GAUZE/BANDAGES/DRESSINGS) ×1 IMPLANT
DRAIN SNY 10X20 3/4 PERF (WOUND CARE) IMPLANT
ELECT REM PT RETURN 9FT ADLT (ELECTROSURGICAL) ×2
ELECTRODE REM PT RTRN 9FT ADLT (ELECTROSURGICAL) ×1 IMPLANT
EVACUATOR SILICONE 100CC (DRAIN) IMPLANT
GLOVE BIO SURGEON STRL SZ 6 (GLOVE) ×4 IMPLANT
GLOVE BIOGEL PI IND STRL 6.5 (GLOVE) ×4 IMPLANT
GLOVE BIOGEL PI INDICATOR 6.5 (GLOVE) ×4
GLOVE ECLIPSE 6.5 STRL STRAW (GLOVE) ×2 IMPLANT
GLOVE SS BIOGEL STRL SZ 7.5 (GLOVE) ×1 IMPLANT
GLOVE SUPERSENSE BIOGEL SZ 7.5 (GLOVE) ×1
GOWN STRL REUS W/ TWL LRG LVL3 (GOWN DISPOSABLE) ×5 IMPLANT
GOWN STRL REUS W/TWL LRG LVL3 (GOWN DISPOSABLE) ×5
KIT BASIN OR (CUSTOM PROCEDURE TRAY) ×2 IMPLANT
KIT TURNOVER KIT B (KITS) ×2 IMPLANT
LOOP VESSEL MAXI BLUE (MISCELLANEOUS) ×4 IMPLANT
NS IRRIG 1000ML POUR BTL (IV SOLUTION) ×4 IMPLANT
PACK PERIPHERAL VASCULAR (CUSTOM PROCEDURE TRAY) ×2 IMPLANT
PAD ARMBOARD 7.5X6 YLW CONV (MISCELLANEOUS) ×2 IMPLANT
PATCH HEMASHIELD 8X75 (Vascular Products) ×2 IMPLANT
STOPCOCK 4 WAY LG BORE MALE ST (IV SETS) ×2 IMPLANT
SUT ETHILON 3 0 PS 1 (SUTURE) IMPLANT
SUT PROLENE 5 0 C 1 24 (SUTURE) ×2 IMPLANT
SUT PROLENE 6 0 CC (SUTURE) ×8 IMPLANT
SUT VIC AB 2-0 CTX 36 (SUTURE) ×2 IMPLANT
SUT VIC AB 3-0 SH 27 (SUTURE) ×1
SUT VIC AB 3-0 SH 27X BRD (SUTURE) ×1 IMPLANT
SYRINGE 3CC LL L/F (MISCELLANEOUS) ×2 IMPLANT
TOWEL GREEN STERILE (TOWEL DISPOSABLE) ×2 IMPLANT
TRAY FOLEY MTR SLVR 16FR STAT (SET/KITS/TRAYS/PACK) IMPLANT
UNDERPAD 30X30 (UNDERPADS AND DIAPERS) ×2 IMPLANT
WATER STERILE IRR 1000ML POUR (IV SOLUTION) ×2 IMPLANT

## 2017-12-29 NOTE — Discharge Summary (Signed)
Discharge Summary     Terry [name]. April 19, 1935 83 y.o. male  [id number]  Admission Date: 12/29/2017  Discharge Date: 12/31/17  Physician: No att. providers found  Admission Diagnosis: PERIPHERAL ARTERY DISEASE  LEFT FOOT INFECTION   HPI:   This is a 83 y.o. male here today for follow-up.  He underwent aortogram with bilateral lower extremity runoff via brachial artery approach last week.  Is here for discussion.  He has no change in his left foot since my prior visit with him.  Does continue to have claudication but this is not limiting due to his severe COPD.  He does not have any rest pain in his left foot.  Hospital Course:  The patient was admitted to the hospital and taken to the operating room on 12/29/2017 and underwent: Left common and profunda femoral endarterectomy and Dacron patch angioplasty    The pt tolerated the procedure well and was transported to the PACU in good condition.  Renal was called for dialysis as he is ESRD.    By POD 1, he was doing well with minimal discomfort in groin and no pain in his foot.  Left groin with mild bruising and excellent pulse.  Mild improvement in left foot with resolution of dependent rubor.  No change in superficial ulcerations around his toes.  Pt refused ABI's and unable to tolerate cuff pressure.  On POD 2, his mobility improved and he was able to discharge home with HHPT.    The remainder of the hospital course consisted of increasing mobilization and increasing intake of solids without difficulty.  CBC    Component Value Date/Time   WBC 9.2 12/30/2017 0443   RBC 3.03 (L) 12/30/2017 0443   HGB 9.1 (L) 12/30/2017 0443   HCT 31.1 (L) 12/30/2017 0443   PLT 222 12/30/2017 0443   MCV 102.6 (H) 12/30/2017 0443   MCH 30.0 12/30/2017 0443   MCHC 29.3 (L) 12/30/2017 0443   RDW 21.3 (H) 12/30/2017 0443   LYMPHSABS 0.1 (L) 11/29/2017 1910   MONOABS 0.6 11/29/2017 1910   EOSABS 0.1 11/29/2017 1910   BASOSABS 0.0  11/29/2017 1910    BMET    Component Value Date/Time   NA 137 12/30/2017 0443   K 4.7 12/30/2017 0443   CL 94 (L) 12/30/2017 0443   CO2 25 12/30/2017 0443   GLUCOSE 141 (H) 12/30/2017 0443   BUN 77 (H) 12/30/2017 0443   CREATININE 7.66 (H) 12/30/2017 0443   CREATININE 2.62 (H) 05/25/2014 1711   CALCIUM 8.1 (L) 12/30/2017 0443   GFRNONAA 6 (L) 12/30/2017 0443   GFRAA 7 (L) 12/30/2017 0443       Discharge Diagnosis:  PERIPHERAL ARTERY DISEASE  LEFT FOOT INFECTION  Secondary Diagnosis: Patient Active Problem List   Diagnosis Date Noted  . PAD (peripheral artery disease) ([place]) 12/29/2017  . Aortoiliac occlusive disease ([place]) 12/16/2017  . Aortic valve stenosis   . Drowsiness 11/29/2017  . COPD exacerbation ([place])   . Acute respiratory failure ([place]) 11/23/2017  . Anemia   . Coronavirus infection 06/22/2017  . End stage renal disease on dialysis ([place])   . Hypoxia   . COPD with acute exacerbation ([place]) 06/21/2017  . Sepsis ([place]) 06/21/2017  . Hypertension 03/15/2017  . Chest pain 03/15/2017  . High cholesterol 03/15/2017  . CKD (chronic kidney disease) stage V requiring chronic dialysis ([place]) 03/15/2017  . COPD (chronic obstructive pulmonary disease) GOLD stage III 03/15/2017  . Chronic hypoxemic respiratory failure ([place]) 03/15/2017  .  PAF (paroxysmal atrial fibrillation) ([place]) 03/15/2017  . Acute on chronic respiratory failure with hypoxia ([place])   . Pulmonary infiltrates on CXR 02/10/2017  . Upper airway cough syndrome 08/13/2016  . Memory loss of unknown cause 04/08/2016  . Chronic respiratory failure with hypoxia ([place]) 08/27/2015  . ESRD (end stage renal disease) on dialysis ([place]) 07/28/2015  . HCAP (healthcare-associated pneumonia)   . Pneumothorax, left   . Acute on chronic combined systolic and diastolic CHF (congestive heart failure) ([place])   . Anemia of chronic disease   . Pneumonia due to Enterobacter cloacae ([place])   . Pneumonia due to Klebsiella pneumoniae  ([place])   . Chronic atrial fibrillation ([place])   . CAD (coronary artery disease) 02/04/2015  . Hyperkalemia 04/13/2014  . COPD GOLD IV 10/22/2012  . Other and unspecified hyperlipidemia 10/22/2012  . Osteoarthritis 10/22/2012  . Essential hypertension 10/22/2012   Past Medical History:  Diagnosis Date  . Acute and chronic respiratory failure 10/22/2012  . Anxiety   . Arthritis    "hands, knees" (03/15/2017)  . Atrial fibrillation ([place])   . CAD (coronary artery disease) [place], [name]  . CHF (congestive heart failure) ([place]) 09/2012  . COPD (chronic obstructive pulmonary disease) ([place])   . ESRD (end stage renal disease) on dialysis [facility])    "MWF; [facility], [place]" (03/15/2017)  . Flu 09/24/2016   influenza b  . HCAP (healthcare-associated pneumonia) 03/15/2017  . High cholesterol   . History of blood transfusion 2012   "related to bowel resection"  . Hypertension   . On home oxygen therapy    "1L typically at night" (03/15/2017)  . Osteoarthritis 10/22/2012  . Pneumonia April 2014; 09/2016  . Shortness of breath dyspnea   . Stroke [facility]) 2012   denies residual on 03/15/2017  . Stroke [facility]) ~ 2013   "small one in his right eye"     Allergies as of 12/31/2017      Reactions   Yellow Dyes (non-tartrazine) Other (See Comments)   Burns arms; Cough medications (tussionex and benzonatate ok) specific - yellow dye used for ophthalmology procedure    Levaquin [levofloxacin In D5w] Itching   Levofloxacin Nausea And Vomiting      Medication List    TAKE these medications   acetaminophen 500 MG tablet Commonly known as:  TYLENOL Take 1,000 mg by mouth every 6 (six) hours as needed for mild pain or headache.   albuterol 108 (90 Base) MCG/ACT inhaler Commonly known as:  PROVENTIL HFA;VENTOLIN HFA Inhale 2 puffs into the lungs every 4 (four) hours as needed for wheezing or shortness of breath.   albuterol (2.5 MG/3ML) 0.083% nebulizer solution Commonly known as:   PROVENTIL Take 3 mLs (2.5 mg total) by nebulization every 4 (four) hours as needed for wheezing or shortness of breath.   aspirin EC 81 MG tablet Take 81 mg by mouth daily.   atorvastatin 80 MG tablet Commonly known as:  LIPITOR Take 1 tablet (80 mg total) by mouth daily.   budesonide-formoterol 160-4.5 MCG/ACT inhaler Commonly known as:  SYMBICORT Inhale 2 puffs into the lungs 2 (two) times daily.   calcium acetate 667 MG capsule Commonly known as:  PHOSLO Take 667-1,334 mg by mouth See admin instructions. Take 1334 mg by mouth 3 times daily with each meal and take 667 mg by mouth with each snack   cetirizine 10 MG tablet Commonly known as:  ZYRTEC Take 10 mg by mouth daily as needed for  allergies.   clopidogrel 75 MG tablet Commonly known as:  PLAVIX Take 1 tablet (75 mg total) by mouth daily.   dextromethorphan-guaiFENesin 30-600 MG 12hr tablet Commonly known as:  MUCINEX DM Take 1 tablet by mouth 2 (two) times daily.   ethyl chloride spray Apply 1 application topically every Monday, Wednesday, and Friday.   famotidine 20 MG tablet Commonly known as:  PEPCID Take 1 tablet (20 mg total) by mouth 2 (two) times daily as needed for heartburn or indigestion.   Glycopyrrolate-Formoterol 9-4.8 MCG/ACT Aero Commonly known as:  BEVESPI AEROSPHERE Inhale 2 puffs into the lungs 2 (two) times daily.   methocarbamol 500 MG tablet Commonly known as:  ROBAXIN Take 500 mg by mouth 2 (two) times daily as needed for muscle spasms.   metoprolol succinate 50 MG 24 hr tablet Commonly known as:  TOPROL-XL Take 50 mg by mouth 2 (two) times daily. Take with or immediately following a meal.   midodrine 10 MG tablet Commonly known as:  PROAMATINE Take 10 mg by mouth See admin instructions. Take 10 mg by mouth on Mon/Wed/Fri at dialysis as needed for low blood pressure   oxyCODONE-acetaminophen 5-325 MG tablet Commonly known as:  PERCOCET/ROXICET Take 1 tablet by mouth every 6 (six)  hours as needed for moderate pain.   OXYGEN Inhale 1 L into the lungs daily as needed (for shortness of breath).   oxymetazoline 0.05 % nasal spray Commonly known as:  AFRIN Place 1 spray into both nostrils once as needed (to stop nose bleeds).   predniSONE 10 MG tablet Commonly known as:  DELTASONE Take 6 tablets (60 mg total) by mouth daily with breakfast. And decrease by one tablet daily, then continue Prednisone 10 mg po daily What changed:    how much to take  additional instructions       Discharge Instructions: [facility] Discharge instructions Lower Extremity Bypass Surgery  Please refer to the following instruction for your post-procedure care. Your surgeon or physician assistant will discuss any changes with you.  Activity  You are encouraged to walk as much as you can. You can slowly return to normal activities during the month after your surgery. Avoid strenuous activity and heavy lifting until your doctor tells you it's OK. Avoid activities such as vacuuming or swinging a golf club. Do not drive until your doctor give the OK and you are no longer taking prescription pain medications. It is also normal to have difficulty with sleep habits, eating and bowel movement after surgery. These will go away with time.  Bathing/Showering  You may shower after you go home. Do not soak in a bathtub, hot tub, or swim until the incision heals completely.  Incision Care  Clean your incision with mild soap and water. Shower every day. Pat the area dry with a clean towel. You do not need a bandage unless otherwise instructed. Do not apply any ointments or creams to your incision. If you have open wounds you will be instructed how to care for them or a visiting nurse may be arranged for you. If you have staples or sutures along your incision they will be removed at your post-op appointment. You may have skin glue on your incision. Do not peel it off. It  will come off on its own in about one week.  Wash the groin wound with soap and water daily and pat dry. (No tub bath-only shower)  Then put a dry gauze or washcloth in  the groin to keep this area dry to help prevent wound infection.  Do this daily and as needed.  Do not use Vaseline or neosporin on your incisions.  Only use soap and water on your incisions and then protect and keep dry.  Diet  Resume your normal diet. There are no special food restrictions following this procedure. A low fat/ low cholesterol diet is recommended for all patients with vascular disease. In order to heal from your surgery, it is CRITICAL to get adequate nutrition. Your body requires vitamins, minerals, and protein. Vegetables are the best source of vitamins and minerals. Vegetables also provide the perfect balance of protein. Processed food has little nutritional value, so try to avoid this.  Medications  Resume taking all your medications unless your doctor or Physician Assistant tells you not to. If your incision is causing pain, you may take over-the-counter pain relievers such as acetaminophen (Tylenol). If you were prescribed a stronger pain medication, please aware these medication can cause nausea and constipation. Prevent nausea by taking the medication with a snack or meal. Avoid constipation by drinking plenty of fluids and eating foods with high amount of fiber, such as fruits, vegetables, and grains. Take Colace 100 mg (an over-the-counter stool softener) twice a day as needed for constipation. Do not take Tylenol if you are taking prescription pain medications.  Follow Up  Our office will schedule a follow up appointment 2-3 weeks following discharge.  Please call us immediately for any of the following conditions  .Severe or worsening pain in your legs or feet while at rest or while walking .Increase pain, redness, warmth, or drainage (pus) from your incision site(s) Fever of 101 degree or higher The  swelling in your leg with the bypass suddenly worsens and becomes more painful than when you were in the hospital If you have been instructed to feel your graft pulse then you should do so every day. If you can no longer feel this pulse, call the office immediately. Not all patients are given this instruction.  Leg swelling is common after leg bypass surgery.  The swelling should improve over a few months following surgery. To improve the swelling, you may elevate your legs above the level of your heart while you are sitting or resting. Your surgeon or physician assistant may ask you to apply an ACE wrap or wear compression (TED) stockings to help to reduce swelling.  Reduce your risk of vascular disease  Stop smoking. If you would like help call [facility] at 1-800-QUIT-NOW 856-563-5478) or [place] at 208-580-2639.  Manage your cholesterol Maintain a desired weight Control your diabetes weight Control your diabetes Keep your blood pressure down  If you have any questions, please call the office at [phone]   Prescriptions given: 1.  Roxicet #15 No Refill  Disposition: home  Patient's condition: is Good  Follow up: 1. Dr. Donnetta Hutching in 2-3 weeks   Leontine Locket, PA-C [facility] [phone] 01/03/2018  11:58 AM  - For VQI Registry use ---   Post-op:  Wound infection: No  Graft infection: No  Transfusion: No    If yes, n/a units given New Arrhythmia: No Ipsilateral amputation: No, [ ]  Minor, [ ]  BKA, [ ]  AKA Discharge patency: [x ] Primary, [ ]  Primary assisted, [ ]  Secondary, [ ]  Occluded Patency judged by: [x ] Dopper only, [ ]  Palpable graft pulse, []  Palpable distal pulse, [ ]  ABI inc. > 0.15, [ ]  Duplex Discharge ABI: R refused,  L  D/C Ambulatory Status: Ambulatory with Assistance  Complications: MI: No, [ ]  Troponin only, [ ]  EKG or Clinical CHF: No Resp failure:No, [ ]  Pneumonia, [ ]  Ventilator Chg in renal function: No, [ ]  Inc. Cr  > 0.5, [ ]  Temp. Dialysis,  [ ]  Permanent dialysis Stroke: No, [ ]  Minor, [ ]  Major Return to OR: No  Reason for return to OR: [ ]  Bleeding, [ ]  Infection, [ ]  Thrombosis, [ ]  Revision  Discharge medications: Statin use:  yes ASA use:  yes Plavix use:  yes Beta blocker use: yes CCB use:  No ACEI use:   no ARB use:  no Coumadin use: no

## 2017-12-29 NOTE — Anesthesia Procedure Notes (Signed)
Procedure Name: LMA Insertion Date/Time: 12/29/2017 11:58 AM Performed by: Murvin Natal, MD Pre-anesthesia Checklist: Patient identified, Emergency Drugs available, Suction available and Patient being monitored Patient Re-evaluated:Patient Re-evaluated prior to induction Oxygen Delivery Method: Circle System Utilized Preoxygenation: Pre-oxygenation with 100% oxygen Induction Type: IV induction Ventilation: Mask ventilation without difficulty LMA: LMA with gastric port inserted LMA Size: 4.0 Number of attempts: 1 Placement Confirmation: positive ETCO2 Tube secured with: Tape Dental Injury: Teeth and Oropharynx as per pre-operative assessment

## 2017-12-29 NOTE — Anesthesia Postprocedure Evaluation (Signed)
Anesthesia Post Note  Patient: Terry [name].  Procedure(s) Performed: Left Femoral ENDARTERECTOMY with Patch Angioplasty using Xenosure patch (Left Groin)     Patient location during evaluation: PACU Anesthesia Type: General Level of consciousness: awake Pain management: pain level controlled Vital Signs Assessment: post-procedure vital signs reviewed and stable Respiratory status: spontaneous breathing, nonlabored ventilation, respiratory function stable and patient connected to nasal cannula oxygen Cardiovascular status: blood pressure returned to baseline and stable Postop Assessment: no apparent nausea or vomiting Anesthetic complications: no    Last Vitals:  Vitals:   12/29/17 1603 12/29/17 1630  BP:  134/65  Pulse: 86 86  Resp:  20  Temp:    SpO2:  93%    Last Pain:  Vitals:   12/29/17 1602  TempSrc: Axillary  PainSc:                  Karyl Kinnier Alexus Michael

## 2017-12-29 NOTE — Op Note (Signed)
    OPERATIVE REPORT  DATE OF SURGERY: 12/29/2017  PATIENT: Terry [name]., 83 y.o. male MRN: [id number]  DOB: 1934/08/29  PRE-OPERATIVE DIAGNOSIS: Critical limb ischemia left lower extremity  POST-OPERATIVE DIAGNOSIS:  Same  PROCEDURE: Left common and profunda femoral endarterectomy and Dacron patch angioplasty  SURGEON:  Curt Jews, M.D.  PHYSICIAN ASSISTANT: Nurse  ANESTHESIA: LMA  EBL: 50 ml  Total I/O In: -  Out: 50 [Blood:50]  BLOOD ADMINISTERED: None  DRAINS: None  SPECIMEN: None  COUNTS CORRECT:  YES  PLAN OF CARE: PACU  PATIENT DISPOSITION:  PACU - hemodynamically stable  PROCEDURE DETAILS: Patient was taken to the operative placed supine position of the area of the left groin and thigh were prepped and draped you sterile fashion.  Sedation was made over the common femoral artery and carried down to isolate the common femoral artery which was extremely calcified and occluded.  Dissection was tented up onto the inguinal ligament and the external iliac artery was exposed under the inguinal ligament.  Tributary branches were controlled with 2-0 silk Potts tie.  The superficial femoral artery was chronically occluded and was encircled with a vessel loop.  The profunda was of large caliber and was controlled with a blue vessel loop.  The patient was given 8000 units of intravenous heparin and after adequate circulation time the external iliac artery was occluded with a Henley clamp and the profundus femoris artery was occluded with a profunda clamp.  The common femoral artery was opened with 11 blade and sent longitudinally with Potts scissors.  There was extensive occlusive clot plaque present.  This was endarterectomized up under the inguinal ligament.  The superficial femoral artery was chronically occluded and was ligated.  The plaque extended to the origin of the deep femoral artery and this was endarterectomized.  The plaque was tacked at the origin of the deep  femoral artery with interrupted 6-0 Prolene sutures.  A Finesse Hemashield Dacron patch was brought into the field and was sewn as a patch angioplasty with a running 6-0 Prolene suture.  Prior to completion of the closure the usual flushing maneuvers were undertaken.  The anastomosis completed and flow was restored to the deep femoral artery.  The patient was given 50 mg of protamine to reverse heparin.  Wounds irrigated with saline.  The wounds were closed with 2-0 Vicryl in several layers in the subcutaneous tissue.  Skin was closed with 3 oh sub-particular Vicryl suture.  The sterile dressing was applied and the patient was transferred to the recovery room in stable condition   Rosetta Posner, M.D., [facility] 12/29/2017 2:29 PM

## 2017-12-29 NOTE — Consult Note (Signed)
Renal Service Consult Note [name]  Terry [name] 12/29/2017 Sol Blazing Requesting Physician:  Dr Early  Reason for Consult:  ESRD pt sp vasc surgery to LLE  HPI: The patient is a 83 y.o. year-old w/ hx esrd, COPD, combined dsCHF, afib, and HTN admitted for left leg surgical procedure for the indication of critical limb ischemia.  Post op asked to see for esrd.    Pt is postop after L femoral art endarterectomy.  Has some L knee and L groin pain.  No SOB , cough, no wheezing or chest pain.  No n/v/d.     ECHart:  April 2018 - HCAP, COPD, esrd on HD +flu, IV abx  Sept 2018 - HCAP , esrd, COPD exac  Jan 2019 -  COPD exac, HTN, esrd  Jan 2019 -  Copd exac, PAF, esrd  jun 2019 - a/c resp failure, COPD exac, afib/ RVR, esrd on HD, CAD+stent  jun 2019 - a/c resp failure, a/c dsCHF, esrd on HD, afib, mod severe AS  ROS  denies CP  no joint pain   no HA  no blurry vision  no rash  no diarrhea  no nausea/ vomiting   Past Medical History  Past Medical History:  Diagnosis Date  . Acute and chronic respiratory failure 10/22/2012  . Anxiety   . Arthritis    "hands, knees" (03/15/2017)  . Atrial fibrillation ([place])   . CAD (coronary artery disease) [place], [name]  . CHF (congestive heart failure) ([place]) 09/2012  . COPD (chronic obstructive pulmonary disease) ([place])   . ESRD (end stage renal disease) on dialysis [facility])    "MWF; [facility], [place]" (03/15/2017)  . Flu 09/24/2016   influenza b  . HCAP (healthcare-associated pneumonia) 03/15/2017  . High cholesterol   . History of blood transfusion 2012   "related to bowel resection"  . Hypertension   . On home oxygen therapy    "1L typically at night" (03/15/2017)  . Osteoarthritis 10/22/2012  . Pneumonia April 2014; 09/2016  . Shortness of breath dyspnea   . Stroke [facility]) 2012   denies residual on 03/15/2017  . Stroke [facility]) ~ 2013   "small one in his right eye"   Past Surgical History   Past Surgical History:  Procedure Laterality Date  . ABDOMINAL AORTOGRAM N/A 12/16/2017   Procedure: ABDOMINAL AORTOGRAM;  Surgeon: Conrad [place], MD;  Location: [place] CV LAB;  Service: Cardiovascular;  Laterality: N/A;  . APPENDECTOMY  1984  . AV FISTULA PLACEMENT Right 05/21/2015   Procedure: Right Arm Brachiocephalic ARTERIOVENOUS (AV) FISTULA CREATION;  Surgeon: Angelia Mould, MD;  Location: [place];  Service: Vascular;  Laterality: Right;  . CATARACT EXTRACTION W/ INTRAOCULAR LENS  IMPLANT, BILATERAL Bilateral   . COLECTOMY  2012   partial colon removed - twisted bowel  . CORONARY ANGIOPLASTY WITH STENT PLACEMENT  1999   2 stents  . CYSTOSCOPY/RETROGRADE/URETEROSCOPY Bilateral 04/23/2014   Procedure: CYSTOSCOPY BILATERAL RETROGRADE,  LEFT URETEROSCOPY, LEFT STENT PLACEMENT;  Surgeon: Raynelle Bring, MD;  Location: [facility];  Service: Urology;  Laterality: Bilateral;  . EYE SURGERY Left    "surgery to clean off lens after cataract OR"  . INGUINAL HERNIA REPAIR Right   . LOWER EXTREMITY ANGIOGRAPHY Bilateral 12/16/2017   Procedure: LOWER EXTREMITY ANGIOGRAPHY;  Surgeon: Conrad , MD;  Location: [place] CV LAB;  Service: Cardiovascular;  Laterality: Bilateral;   Family History  Family History  Problem Relation  Age of Onset  . Cancer Mother        Breast  . Heart disease Father   . Heart attack Father   . Parkinson's disease Brother    Social History  reports that he quit smoking about 17 years ago. His smoking use included cigarettes, pipe, and cigars. He has a 49.00 pack-year smoking history. He has quit using smokeless tobacco. His smokeless tobacco use included chew. He reports that he drinks alcohol. He reports that he does not use drugs. Allergies  Allergies  Allergen Reactions  . Yellow Dyes (Non-Tartrazine) Other (See Comments)    Burns arms; Cough medications (tussionex and benzonatate ok) specific - yellow dye used for ophthalmology procedure   .  Levaquin [Levofloxacin In D5w] Itching  . Levofloxacin Nausea And Vomiting   Home medications Prior to Admission medications   Medication Sig Start Date End Date Taking? Authorizing Provider  acetaminophen (TYLENOL) 500 MG tablet Take 1,000 mg by mouth every 6 (six) hours as needed for mild pain or headache.    Yes [provider]  albuterol (PROVENTIL HFA;VENTOLIN HFA) 108 (90 Base) MCG/ACT inhaler Inhale 2 puffs into the lungs every 4 (four) hours as needed for wheezing or shortness of breath. 11/21/17  Yes Orlie Dakin, MD  albuterol (PROVENTIL) (2.5 MG/3ML) 0.083% nebulizer solution Take 3 mLs (2.5 mg total) by nebulization every 4 (four) hours as needed for wheezing or shortness of breath. 11/21/17  Yes Orlie Dakin, MD  aspirin EC 81 MG tablet Take 81 mg by mouth daily.   Yes [provider]  atorvastatin (LIPITOR) 80 MG tablet Take 1 tablet (80 mg total) by mouth daily. 11/10/17  Yes Burnell Blanks, MD  budesonide-formoterol [facility]) 160-4.5 MCG/ACT inhaler Inhale 2 puffs into the lungs 2 (two) times daily.   Yes [provider]  calcium acetate (PHOSLO) 667 MG capsule Take 667-1,334 mg by mouth See admin instructions. Take 1334 mg by mouth 3 times daily with each meal and take 667 mg by mouth with each snack   Yes [provider]  cetirizine (ZYRTEC) 10 MG tablet Take 10 mg by mouth daily as needed for allergies.    Yes [provider]  dextromethorphan-guaiFENesin (MUCINEX DM) 30-600 MG 12hr tablet Take 1 tablet by mouth 2 (two) times daily.    Yes [provider]  ethyl chloride spray Apply 1 application topically every Monday, Wednesday, and Friday.  05/28/17  Yes [provider]  famotidine (PEPCID) 20 MG tablet Take 1 tablet (20 mg total) by mouth 2 (two) times daily as needed for heartburn or indigestion. 12/04/17  Yes Oswald Hillock, MD  methocarbamol (ROBAXIN) 500 MG tablet Take 500 mg by mouth 2 (two) times  daily as needed for muscle spasms.   Yes [provider]  metoprolol succinate (TOPROL-XL) 50 MG 24 hr tablet Take 50 mg by mouth 2 (two) times daily. Take with or immediately following a meal.    Yes [provider]  midodrine (PROAMATINE) 10 MG tablet Take 10 mg by mouth See admin instructions. Take 10 mg by mouth on Mon/Wed/Fri at dialysis as needed for low blood pressure   Yes [provider]  OXYGEN Inhale 1 L into the lungs daily as needed (for shortness of breath).    Yes [provider]  oxymetazoline (AFRIN) 0.05 % nasal spray Place 1 spray into both nostrils once as needed (to stop nose bleeds).    Yes [provider]  predniSONE (DELTASONE) 10  MG tablet Take 6 tablets (60 mg total) by mouth daily with breakfast. And decrease by one tablet daily, then continue Prednisone 10 mg po daily Patient taking differently: Take 10 mg by mouth daily with breakfast.  12/04/17  Yes Darrick Meigs, Marge Duncans, MD  clopidogrel (PLAVIX) 75 MG tablet Take 1 tablet (75 mg total) by mouth daily. 11/10/17   Burnell Blanks, MD  Glycopyrrolate-Formoterol (BEVESPI AEROSPHERE) 9-4.8 MCG/ACT AERO Inhale 2 puffs into the lungs 2 (two) times daily. 11/18/17   Tanda Rockers, MD   Liver Function Tests Recent Labs  Lab 12/28/17 1431  AST 22  ALT 22  ALKPHOS 70  BILITOT 1.2  PROT 6.1*  ALBUMIN 3.1*   No results for input(s): LIPASE, AMYLASE in the last 168 hours. CBC Recent Labs  Lab 12/28/17 1431 12/29/17 1018  WBC 7.0  --   HGB 10.6* 12.2*  HCT 36.5* 36.0*  MCV 102.0*  --   PLT 233  --    Basic Metabolic Panel Recent Labs  Lab 12/28/17 1431 12/29/17 1018  NA 138 136  K 3.9 3.9  CL 92*  --   CO2 31  --   GLUCOSE 199* 83  BUN 40*  --   CREATININE 4.80*  --   CALCIUM 8.7*  --    Iron/TIBC/Ferritin/ %Sat    Component Value Date/Time   IRON 39 (L) 03/15/2017 1223   TIBC 230 (L) 03/15/2017 1223   FERRITIN 1,075 (H) 03/15/2017 1223   IRONPCTSAT 17  (L) 03/15/2017 1223    Vitals:   12/29/17 1501 12/29/17 1517 12/29/17 1530 12/29/17 1603  BP: (!) 127/56 103/62    Pulse: 84 80  86  Resp: 16 14    Temp:   98.1 F (36.7 C)   TempSrc:      SpO2: 96% 94%    Weight:      Height:       Exam Gen alert, no distress, chron ill appearing No rash, cyanosis or gangrene Sclera anicteric, throat clear  No jvd or bruits Chest dec'd BS throughout, no wheezing, some dec'd at bases bilat RRR no MRG Abd soft ntnd no mass or ascites +bs GU normal male MS no joint effusions or deformity Ext 1-2+ pitting pretib bilat edema, L groin wound from OR today Neuro is alert, Ox 3 , nf    Home meds:  - toprol xl 50 bid/ midodrine 10 mg pre HD on mwf  - ecasa 81/ plavix 75/ lipitor 80 qd  - alb nebs prn/ symbicort 160-4.5 2 puffs bid/ home O2/ mucinex dm prn  - pepcid/ prn robaxin/ zyrtec prn/ phoslo ac/ tylenol prn   Dialysis: MWF [facility]  4h  2/2 bath  66kg   p4 - hect 4 ug  - venofer 100 q hd x 10  - mircera 225 every 2 wks last 6.28     Impression: 1  LLE ischemia - sp L fem artery CEA, by VVS today 2  ESRD on HD MWF. Missed HD today, plan HD tomorrow then again Fri 3  Vol excess - some significant bilat LE edema, 3-4kg up today 4  COPD / chron resp failure - on 1L O2 at home, not at all times. Stable resp status at this time, ordered bid sched duoneb hopefully will not flare up 5  Afib - takes toprol xl 50 bid for this, continue 6  Hypotension - gets midodrine pre HD tiw 7  CAD hx stent x 2 - at outside  facility in Mar 2019   Plan - as above  Kelly Splinter MD [employer] pager [phone]   12/29/2017, 4:16 PM

## 2017-12-29 NOTE — Anesthesia Preprocedure Evaluation (Addendum)
Anesthesia Evaluation  Patient identified by MRN, date of birth, ID band Patient awake    Reviewed: Allergy & Precautions, NPO status , Patient's Chart, lab work & pertinent test results  Airway Mallampati: II  TM Distance: >3 FB Neck ROM: Full    Dental  (+) Lower Dentures, Upper Dentures   Pulmonary COPD (on 2 L ),  COPD inhaler and oxygen dependent, former smoker,    Pulmonary exam normal breath sounds clear to auscultation       Cardiovascular hypertension, Pt. on home beta blockers + CAD, + Cardiac Stents, + Peripheral Vascular Disease and +CHF  Normal cardiovascular exam+ Valvular Problems/Murmurs AS  Rhythm:Regular Rate:Normal  ECG: A-fib with RVR  Cardiologist - Dr. [name]  ECHO: Low normal to mildly reduced LV systolic function (EF 50); mild diastolic dysfunction; mild LVH; calcified aortic valve with fixed right coronary cusp; mild AS (mean gradient 13 mmHg); mild LAE.    Neuro/Psych Anxiety TIACVA, No Residual Symptoms    GI/Hepatic negative GI ROS, Neg liver ROS,   Endo/Other  negative endocrine ROS  Renal/GU ESRF and DialysisRenal disease (on HD M, W, F)     Musculoskeletal HLD   Abdominal   Peds  Hematology  (+) anemia ,   Anesthesia Other Findings PERIPHERAL ARTERY DISEASE LEFT FOOT INFECTION  Reproductive/Obstetrics                            Anesthesia Physical Anesthesia Plan  ASA: IV  Anesthesia Plan: General   Post-op Pain Management:    Induction: Intravenous  PONV Risk Score and Plan: 2 and Ondansetron, Dexamethasone and Treatment may vary due to age or medical condition  Airway Management Planned: LMA  Additional Equipment:   Intra-op Plan:   Post-operative Plan: Extubation in OR  Informed Consent: I have reviewed the patients History and Physical, chart, labs and discussed the procedure including the risks, benefits and alternatives for the  proposed anesthesia with the patient or authorized representative who has indicated his/her understanding and acceptance.   Dental advisory given  Plan Discussed with: CRNA  Anesthesia Plan Comments: Lyla Son)       Anesthesia Quick Evaluation

## 2017-12-29 NOTE — Transfer of Care (Signed)
Immediate Anesthesia Transfer of Care Note  Patient: Terry [name].  Procedure(s) Performed: Left Femoral ENDARTERECTOMY with Patch Angioplasty using Xenosure patch (Left Groin)  Patient Location: PACU  Anesthesia Type:General  Level of Consciousness: drowsy  Airway & Oxygen Therapy: Patient Spontanous Breathing and Patient connected to face mask oxygen  Post-op Assessment: Report given to RN and Post -op Vital signs reviewed and stable  Post vital signs: Reviewed and stable  Last Vitals:  Vitals Value Taken Time  BP    Temp    Pulse 77 12/29/2017  2:31 PM  Resp 14 12/29/2017  2:31 PM  SpO2 100 % 12/29/2017  2:31 PM  Vitals shown include unvalidated device data.  Last Pain:  Vitals:   12/29/17 0942  TempSrc: Oral         Complications: No apparent anesthesia complications

## 2017-12-29 NOTE — Progress Notes (Signed)
Patient arrived on the unit from PACU on a hospital bed, placed on tele ccmd notified, assessment completed see flowsheet, patient oriented to room and staff, bed in lowest position call bell within reach will continue to monitor.

## 2017-12-29 NOTE — Interval H&P Note (Signed)
History and Physical Interval Note:  12/29/2017 10:56 AM  Terry [name].  has presented today for surgery, with the diagnosis of PERIPHERAL ARTERY DISEASE /LEFT FOOT INFECTION  The various methods of treatment have been discussed with the patient and family. After consideration of risks, benefits and other options for treatment, the patient has consented to  Procedure(s): ENDARTERECTOMY FEMORAL LEFT LOWER EXTREMITY (Left) as a surgical intervention .  The patient's history has been reviewed, patient examined, no change in status, stable for surgery.  I have reviewed the patient's chart and labs.  Questions were answered to the patient's satisfaction.     Curt Jews

## 2017-12-29 NOTE — Progress Notes (Signed)
Patient's wife reported giving calcium acetate to patient with his dinner, patient and his wife were educated on the need for them not take any medication from home but ask the patient's nurse for meds that the [patient need they verbalised understanding, Ruta Hinds, MD paged for orders for calcium acetate, verbal order received for calcium acetate  will continue to monitor.

## 2017-12-29 NOTE — Discharge Instructions (Signed)
 [facility]  Discharge instructions  Lower Extremity Bypass Surgery  Please refer to the following instruction for your post-procedure care. Your surgeon or physician assistant will discuss any changes with you.  Activity  You are encouraged to walk as much as you can. You can slowly return to normal activities during the month after your surgery. Avoid strenuous activity and heavy lifting until your doctor tells you it's OK. Avoid activities such as vacuuming or swinging a golf club. Do not drive until your doctor give the OK and you are no longer taking prescription pain medications. It is also normal to have difficulty with sleep habits, eating and bowel movement after surgery. These will go away with time.  Bathing/Showering  Shower daily after you go home. Do not soak in a bathtub, hot tub, or swim until the incision heals completely.  Incision Care  Clean your incision with mild soap and water. Shower every day. Pat the area dry with a clean towel. You do not need a bandage unless otherwise instructed. Do not apply any ointments or creams to your incision. If you have open wounds you will be instructed how to care for them or a visiting nurse may be arranged for you. If you have staples or sutures along your incision they will be removed at your post-op appointment. You may have skin glue on your incision. Do not peel it off. It will come off on its own in about one week.  Wash the groin wound with soap and water daily and pat dry. (No tub bath-only shower)  Then put a dry gauze or washcloth in the groin to keep this area dry to help prevent wound infection.  Do this daily and as needed.  Do not use Vaseline or neosporin on your incisions.  Only use soap and water on your incisions and then protect and keep dry.  Diet  Resume your normal diet. There are no special food restrictions following this procedure. A low fat/ low cholesterol diet is  recommended for all patients with vascular disease. In order to heal from your surgery, it is CRITICAL to get adequate nutrition. Your body requires vitamins, minerals, and protein. Vegetables are the best source of vitamins and minerals. Vegetables also provide the perfect balance of protein. Processed food has little nutritional value, so try to avoid this.  Medications  Resume taking all your medications unless your doctor or physician assistant tells you not to. If your incision is causing pain, you may take over-the-counter pain relievers such as acetaminophen (Tylenol). If you were prescribed a stronger pain medication, please aware these medication can cause nausea and constipation. Prevent nausea by taking the medication with a snack or meal. Avoid constipation by drinking plenty of fluids and eating foods with high amount of fiber, such as fruits, vegetables, and grains. Take Colace 100 mg (an over-the-counter stool softener) twice a day as needed for constipation.  Do not take Tylenol if you are taking prescription pain medications.  Follow Up  Our office will schedule a follow up appointment 2-3 weeks following discharge.  Please call us immediately for any of the following conditions  Severe or worsening pain in your legs or feet while at rest or while walking Increase pain, redness, warmth, or drainage (pus) from your incision site(s) Fever of 101 degree or higher The swelling in your leg with the bypass suddenly worsens and becomes more painful than when you were in the hospital If you have   been instructed to feel your graft pulse then you should do so every day. If you can no longer feel this pulse, call the office immediately. Not all patients are given this instruction.  Leg swelling is common after leg bypass surgery.  The swelling should improve over a few months following surgery. To improve the swelling, you may elevate your legs above the level of your heart while you are  sitting or resting. Your surgeon or physician assistant may ask you to apply an ACE wrap or wear compression (TED) stockings to help to reduce swelling.  Reduce your risk of vascular disease  Stop smoking. If you would like help call [facility] at 1-800-QUIT-NOW (1-800-784-8669) or [place] at 336-586-4000.  Manage your cholesterol Maintain a desired weight Control your diabetes weight Control your diabetes Keep your blood pressure down  If you have any questions, please call the office at [phone]  

## 2017-12-30 ENCOUNTER — Encounter (HOSPITAL_COMMUNITY): Payer: Self-pay | Admitting: Vascular Surgery

## 2017-12-30 ENCOUNTER — Inpatient Hospital Stay (HOSPITAL_COMMUNITY): Payer: Medicare PPO

## 2017-12-30 ENCOUNTER — Telehealth: Payer: Self-pay | Admitting: Vascular Surgery

## 2017-12-30 DIAGNOSIS — Z9889 Other specified postprocedural states: Secondary | ICD-10-CM

## 2017-12-30 DIAGNOSIS — I739 Peripheral vascular disease, unspecified: Secondary | ICD-10-CM

## 2017-12-30 LAB — CBC
HEMATOCRIT: 31.1 % — AB (ref 39.0–52.0)
Hemoglobin: 9.1 g/dL — ABNORMAL LOW (ref 13.0–17.0)
MCH: 30 pg (ref 26.0–34.0)
MCHC: 29.3 g/dL — ABNORMAL LOW (ref 30.0–36.0)
MCV: 102.6 fL — AB (ref 78.0–100.0)
Platelets: 222 10*3/uL (ref 150–400)
RBC: 3.03 MIL/uL — ABNORMAL LOW (ref 4.22–5.81)
RDW: 21.3 % — AB (ref 11.5–15.5)
WBC: 9.2 10*3/uL (ref 4.0–10.5)

## 2017-12-30 LAB — BASIC METABOLIC PANEL
ANION GAP: 18 — AB (ref 5–15)
BUN: 77 mg/dL — ABNORMAL HIGH (ref 8–23)
CO2: 25 mmol/L (ref 22–32)
Calcium: 8.1 mg/dL — ABNORMAL LOW (ref 8.9–10.3)
Chloride: 94 mmol/L — ABNORMAL LOW (ref 98–111)
Creatinine, Ser: 7.66 mg/dL — ABNORMAL HIGH (ref 0.61–1.24)
GFR calc Af Amer: 7 mL/min — ABNORMAL LOW (ref >60)
GFR calc non Af Amer: 6 mL/min — ABNORMAL LOW (ref >60)
GLUCOSE: 141 mg/dL — AB (ref 70–99)
Potassium: 4.7 mmol/L (ref 3.5–5.1)
Sodium: 137 mmol/L (ref 135–145)

## 2017-12-30 NOTE — Progress Notes (Signed)
PT Cancellation Note  Patient Details Name: Terry [name]. MRN: [id number] DOB: 06/13/1935   Cancelled Treatment:    Reason Eval/Treat Not Completed: Patient declined, no reason specified.  Pt reported he was in too much pain right now to even get up to EOB or OOB to chair.  I encourage him that the sooner he could get up and move, the sooner he could go home and he continued to tell me, "not right now, " "maybe tomorrow".    Thanks,   Barbarann Ehlers. Minnette Merida, [place], DPT [phone]   12/30/2017, 4:53 PM

## 2017-12-30 NOTE — Progress Notes (Signed)
VASCULAR LAB PRELIMINARY  ARTERIAL  ABI completed: Incomplete study, patient refused. Patient was not able to tolerate cuff placement for pressures on his ankle and great toe bilaterally.      RIGHT    LEFT    PRESSURE WAVEFORM  PRESSURE WAVEFORM  BRACHIAL N/A  BRACHIAL 12 Triphasic  DP  monophasic DP  monophasic  AT   AT    PT  monophasic PT  monophasic  PER   PER    GREAT TOE  NA GREAT TOE  NA    RIGHT LEFT  ABI       Estoria Geary, Bonnye Fava, RVT 12/30/2017, 2:43 PM

## 2017-12-30 NOTE — Telephone Encounter (Signed)
sch appt spk to pt wife 01/18/18 1pm p/o MD

## 2017-12-30 NOTE — Progress Notes (Addendum)
[facility] Progress Note   Subjective:  Seen on HD UF goal 4L Tolerating  No c/os this am. Hoping to go home tomorrow   Objective Vitals:   12/30/17 0719 12/30/17 0724 12/30/17 0800 12/30/17 0830  BP: (!) 144/65 (!) 143/76 (!) 149/69 127/63  Pulse: 93 94 97 91  Resp: 15 16 15 16   Temp:      TempSrc:      SpO2: 98% 99% 99% 100%  Weight:      Height:       Physical Exam General: Ill appearing elderly male on NAD  Heart: RRR Lungs: decreased lung sounds Abdomen: soft NT Extremities: 2+ pitting LE edema bilat Dialysis Access: R AVF cannulated on HD     Home meds:  - toprol xl 50 bid/ midodrine 10 mg pre HD on mwf  - ecasa 81/ plavix 75/ lipitor 80 qd  - alb nebs prn/ symbicort 160-4.5 2 puffs bid/ home O2/ mucinex dm prn  - pepcid/ prn robaxin/ zyrtec prn/ phoslo ac/ tylenol prn   Dialysis Orders:  [facility]WF [facility]  4h  2/2 bath  66kg   p4 - hect 4 ug  - venofer 100 q hd x 10  - mircera 225 every 2 wks last 6.28    Assessment/Plan: 1  LLE ischemia - sp L fem artery CEA, by [facility] 7/10 2  ESRD on HD MWF. Missed HD Wed. HD today then back on schedule tomorrow.  3  Vol excess - some significant bilat LE edema, 3-4kg up today 4  COPD / chron resp failure - on 1L O2 at home, not at all times. Stable resp status at this time, ordered bid sched duoneb hopefully will not flare up 5  Afib - takes toprol xl 50 bid for this, continue 6  Hypotension - gets midodrine pre HD tiw 7  CAD hx stent x 2 - at outside facility in Mar 2019 8 Anemia Hgb 9. Next ESA dose due 7/12    Lynnda Child PA-C [facility] Pager [phone] 12/30/2017,9:07 AM  LOS: 1 day   Pt seen, examined and agree w A/P as above.  Kelly Splinter MD [facility] pager [phone]   12/30/2017, 2:58 PM    Additional Objective Labs: Basic Metabolic Panel: Recent Labs  Lab 12/28/17 1431 12/29/17 1018 12/29/17 2125 12/30/17 0443  NA 138 136  --  137  K 3.9 3.9   --  4.7  CL 92*  --   --  94*  CO2 31  --   --  25  GLUCOSE 199* 83  --  141*  BUN 40*  --   --  77*  CREATININE 4.80*  --  7.05* 7.66*  CALCIUM 8.7*  --   --  8.1*   CBC: Recent Labs  Lab 12/28/17 1431 12/29/17 1018 12/29/17 2125 12/30/17 0443  WBC 7.0  --  8.4 9.2  HGB 10.6* 12.2* 9.9* 9.1*  HCT 36.5* 36.0* 34.2* 31.1*  MCV 102.0*  --  103.0* 102.6*  PLT 233  --  225 222   Blood Culture    Component Value Date/Time   SDES EXPECTORATED SPUTUM 12/01/2017 2045   SDES EXPECTORATED SPUTUM 12/01/2017 2045   SPECREQUEST NONE 12/01/2017 2045   SPECREQUEST NONE Reflexed from [id number] 12/01/2017 2045   CULT FEW KLEBSIELLA PNEUMONIAE FEW CANDIDA ALBICANS  12/01/2017 2045   REPTSTATUS 12/01/2017 FINAL 12/01/2017 2045   REPTSTATUS 12/04/2017 FINAL 12/01/2017 2045    Cardiac Enzymes: No results  for input(s): CKTOTAL, CKMB, CKMBINDEX, TROPONINI in the last 168 hours. CBG: Recent Labs  Lab 12/29/17 1431  GLUCAP 122*   Iron Studies: No results for input(s): IRON, TIBC, TRANSFERRIN, FERRITIN in the last 72 hours. Lab Results  Component Value Date   INR 1.06 12/28/2017   INR 0.97 12/28/2016   INR 1.05 07/28/2015   Medications: . magnesium sulfate 1 - 4 g bolus IVPB     . calcium acetate  1,334 mg Oral TID WC  . calcium acetate  667 mg Oral With snacks  . Chlorhexidine Gluconate Cloth  6 each Topical Q0600  . Chlorhexidine Gluconate Cloth  6 each Topical Q0600  . heparin  5,000 Units Subcutaneous Q8H  . ipratropium-albuterol  3 mL Nebulization TID  . [START ON 12/31/2017] midodrine  10 mg Oral Q M,W,F-HD  . mupirocin ointment  1 application Nasal BID

## 2017-12-30 NOTE — Progress Notes (Signed)
OT Cancellation Note  Patient Details Name: Terry [name]. MRN: [id number] DOB: 04-02-1935   Cancelled Treatment:    Reason Eval/Treat Not Completed: Patient at procedure or test/ unavailable(Currently in HD, will follow.)  Malka So 12/30/2017, 8:46 AM  12/30/2017 Nestor Lewandowsky, OTR/L Pager: [phone]

## 2017-12-30 NOTE — Progress Notes (Signed)
Pt. Will receive AD form when back on unit.  Will follow as needed.  Jaclynn Major, [place], [facility], Pager [phone]

## 2017-12-30 NOTE — Progress Notes (Signed)
Currently in hemodialysis.  Reports minimal discomfort from his left groin incision and that he is having no pain in his left foot.  Left groin with mild bruising and excellent pulse. Medic improvement in left foot with resolution of dependent rubor.  No change in superficial ulcerations around his toes.  Currently in hemodialysis.  Will mobilize later today and plan discharge tomorrow if he is comfortable walking

## 2017-12-30 NOTE — Progress Notes (Signed)
PT Cancellation Note  Patient Details Name: Terry [name]. MRN: [id number] DOB: 02/12/1935   Cancelled Treatment:    Reason Eval/Treat Not Completed: Patient at procedure or test/unavailable   Shary Decamp [facility] 12/30/2017, 10:57 AM Suanne Marker PT [phone]

## 2017-12-30 NOTE — Care Management Note (Signed)
Case Management Note Marvetta Gibbons RN, BSN Unit 4E-Case Manager [phone]  Patient Details  Name: Terry [name]. MRN: [id number] Date of Birth: 02-24-1935  Subjective/Objective:   Pt admitted with critical limb ischemia- s/p Left common and profunda femoral endarterectomy and Dacron patch angioplasty on 7/10              Action/Plan: PTA pt lived at home with spouse- notified by Rhys Martini with [facility] that pt has pre-op referral for [facility] needs- PT/OT evals pending. CM will follow for transition of care needs.   Expected Discharge Date:                  Expected Discharge Plan:  [place]  In-House Referral:  NA  Discharge planning Services  CM Consult  Post Acute Care Choice:  Home Health Choice offered to:  Patient  DME Arranged:    DME Agency:     HH Arranged:    [place] Agency:  [facility]  Status of Service:  In process, will continue to follow  If discussed at Long Length of Stay Meetings, dates discussed:    Discharge Disposition:   Additional Comments:  Dawayne Patricia, RN 12/30/2017, 12:19 PM

## 2017-12-31 MED ORDER — CHLORHEXIDINE GLUCONATE CLOTH 2 % EX PADS: 6.0000 | MEDICATED_PAD | Freq: Every day | CUTANEOUS | Status: DC

## 2017-12-31 MED ORDER — OXYCODONE-ACETAMINOPHEN 5-325 MG PO TABS: 1.0000 | ORAL_TABLET | Freq: Four times a day (QID) | ORAL | 0 refills | Status: DC | PRN

## 2017-12-31 MED ORDER — SODIUM CHLORIDE 0.9 % IV SOLN: 100.0000 mL | INTRAVENOUS | Status: DC | PRN

## 2017-12-31 MED ORDER — PENTAFLUOROPROP-TETRAFLUOROETH EX AERO: 1.0000 "application " | INHALATION_SPRAY | CUTANEOUS | Status: DC | PRN

## 2017-12-31 MED ORDER — LIDOCAINE-PRILOCAINE 2.5-2.5 % EX CREA
1.0000 "application " | TOPICAL_CREAM | CUTANEOUS | Status: DC | PRN
  Filled 2017-12-31: qty 5

## 2017-12-31 MED ORDER — MIDODRINE HCL 5 MG PO TABS
ORAL_TABLET | ORAL | Status: AC
  Administered 2017-12-31: 10 mg via ORAL
  Filled 2017-12-31: qty 2

## 2017-12-31 NOTE — Plan of Care (Signed)
  Problem: Clinical Measurements: Goal: Diagnostic test results will improve Outcome: Progressing   Problem: Coping: Goal: Level of anxiety will decrease Outcome: Progressing   

## 2017-12-31 NOTE — Progress Notes (Addendum)
[facility] Progress Note   Subjective: no new c/o's, doesn't want to do HD today  Objective Vitals:   12/31/17 0009 12/31/17 0556 12/31/17 0822 12/31/17 0904  BP: 138/70 130/61 (!) 144/43   Pulse: 91 92    Resp: 16 (!) 21    Temp: (!) 97.5 F (36.4 C) 98 F (36.7 C) (!) 97.4 F (36.3 C)   TempSrc: Oral Oral Oral   SpO2: 97% 95%  98%  Weight:      Height:       Physical Exam General: Ill appearing elderly male on NAD  Heart: RRR Lungs: decreased lung sounds Abdomen: soft NT Extremities: 2+ pitting LE edema bilat Dialysis Access: R AVF    Home meds:  - toprol xl 50 bid/ midodrine 10 mg pre HD on mwf  - ecasa 81/ plavix 75/ lipitor 80 qd  - alb nebs prn/ symbicort 160-4.5 2 puffs bid/ home O2/ mucinex dm prn  - pepcid/ prn robaxin/ zyrtec prn/ phoslo ac/ tylenol prn   Dialysis Orders:  MWF [facility]  4h  2/2 bath  66kg   p4  R AVF - hect 4 ug  - venofer 100 q hd x 10  - mircera 225 every 2 wks last 6.28    Assessment: 1  LLE ischemia - sp L fem artery CEA, by VVS 7/10 2  ESRD on HD MWF. HD this afternoon to get back on schedule. .  3  Vol excess - some bilat LE edema, is at dry wt, lower as tol w/ hd today  4  COPD / chron resp failure - on 1L O2 at home. Stable at this time 5  Afib - on toprol xl 50 bid at home but not getting here, will resume 6  Hypotension - gets midodrine pre HD tiw 7  CAD hx stent x 2 - at outside facility in Mar 2019 8 Anemia Hgb 9. Next ESA dose due 7/12   Plan - HD this afternoon.    Kelly Splinter MD [facility] pager [phone]   12/31/2017, 12:02 PM    Additional Objective Labs: Basic Metabolic Panel: Recent Labs  Lab 12/28/17 1431 12/29/17 1018 12/29/17 2125 12/30/17 0443  NA 138 136  --  137  K 3.9 3.9  --  4.7  CL 92*  --   --  94*  CO2 31  --   --  25  GLUCOSE 199* 83  --  141*  BUN 40*  --   --  77*  CREATININE 4.80*  --  7.05* 7.66*  CALCIUM 8.7*  --   --  8.1*   CBC: Recent Labs   Lab 12/28/17 1431 12/29/17 1018 12/29/17 2125 12/30/17 0443  WBC 7.0  --  8.4 9.2  HGB 10.6* 12.2* 9.9* 9.1*  HCT 36.5* 36.0* 34.2* 31.1*  MCV 102.0*  --  103.0* 102.6*  PLT 233  --  225 222   Blood Culture    Component Value Date/Time   SDES EXPECTORATED SPUTUM 12/01/2017 2045   SDES EXPECTORATED SPUTUM 12/01/2017 2045   SPECREQUEST NONE 12/01/2017 2045   SPECREQUEST NONE Reflexed from [id number] 12/01/2017 2045   CULT FEW KLEBSIELLA PNEUMONIAE FEW CANDIDA ALBICANS  12/01/2017 2045   REPTSTATUS 12/01/2017 FINAL 12/01/2017 2045   REPTSTATUS 12/04/2017 FINAL 12/01/2017 2045    Cardiac Enzymes: No results for input(s): CKTOTAL, CKMB, CKMBINDEX, TROPONINI in the last 168 hours. CBG: Recent Labs  Lab 12/29/17 1431  GLUCAP 122*   Iron Studies:  No results for input(s): IRON, TIBC, TRANSFERRIN, FERRITIN in the last 72 hours. Lab Results  Component Value Date   INR 1.06 12/28/2017   INR 0.97 12/28/2016   INR 1.05 07/28/2015   Medications: . magnesium sulfate 1 - 4 g bolus IVPB     . calcium acetate  1,334 mg Oral TID WC  . calcium acetate  667 mg Oral With snacks  . Chlorhexidine Gluconate Cloth  6 each Topical Q0600  . Chlorhexidine Gluconate Cloth  6 each Topical Q0600  . heparin  5,000 Units Subcutaneous Q8H  . ipratropium-albuterol  3 mL Nebulization TID  . midodrine  10 mg Oral Q M,W,F-HD  . mupirocin ointment  1 application Nasal BID

## 2017-12-31 NOTE — Progress Notes (Signed)
Encouraged pt to ambulate for D/C home. Pt agreed to ambulate to room door and back. Pt ambulated to room door and back to bed. HR: elevated to 130's. HR: after ambulated back to 110's. Will continue to monitor.

## 2017-12-31 NOTE — Progress Notes (Signed)
HD tx ended 30 min early (ok per order) @ 2043, UF goal met, blood rinsed back, VSS, report called to Dyann Ruddle, RN

## 2017-12-31 NOTE — Evaluation (Signed)
Occupational Therapy Evaluation Patient Details Name: Terry [name]. MRN: [id number] DOB: 1935/06/09 Today's Date: 12/31/2017    History of Present Illness 83 y.o. M admitted s/p left common and profunda femoral endarterectomy and Dacron patch angioplasty. PMHx:  COPD, chronic respiratory failure on PRN O2, stent, HTN, CHF, stroke, a-fib, CAD, anemia, EF 45%, HD.   Clinical Impression   Pt admitted with above and demonstrates the below listed deficits.  Pt with very limited participation.  Wife provides assist with most ADLs and will continue to do so.  She states she feels very comfortable assisting him, and has all DME needed.   Written HEP provided.   No further OT needs identified as they are uninterested in [facility] at this time due to time constraints - they are willing to receive HHPT.   OT will sign off at this time.     Follow Up Recommendations  Supervision/Assistance - 24 hour;No OT follow up    Equipment Recommendations  None recommended by OT    Recommendations for Other Services       Precautions / Restrictions Precautions Precautions: Fall      Mobility Bed Mobility               General bed mobility comments: pt moved EOB to supine mod I, but required total a to scoot up in the bed, and makes very little attempt to assist therapist and wife even when cued.  Upon further discussion, he and wife report he sleeps in a recliner   Transfers                 General transfer comment: Pt refused at this time     Balance                                           ADL either performed or assessed with clinical judgement   ADL Overall ADL's : Needs assistance/impaired                                       General ADL Comments: Pt would not engage in ADL activity citing fatigue.   Wife reports she feels comfortable with assisting him at discharge.  Discussed use of tub transfer bench for home.        Vision          Perception     Praxis      Pertinent Vitals/Pain Pain Assessment: Faces Faces Pain Scale: Hurts little more Pain Location: groin and radiating down left leg Pain Descriptors / Indicators: Guarding;Aching;Grimacing;Discomfort Pain Intervention(s): Limited activity within patient's tolerance     Hand Dominance Right   Extremity/Trunk Assessment Upper Extremity Assessment Upper Extremity Assessment: Generalized weakness   Lower Extremity Assessment Lower Extremity Assessment: Defer to PT evaluation   Cervical / Trunk Assessment Cervical / Trunk Assessment: Normal   Communication Communication Communication: HOH   Cognition Arousal/Alertness: Awake/alert Behavior During Therapy: WFL for tasks assessed/performed Overall Cognitive Status: Within Functional Limits for tasks assessed                                 General Comments: Pt gruff and minimally interactive.     General Comments  Pt and wife report  they have all needed DME.  Wife feels comfortable with assisting pt at discharge.   Discussed HH therapies.  They are receptive to HHPT "maybe".  They voice that their schedule is very busy with HD, and MD appointments, wife works, that it makes it difficult to manage everything.  Encouraged them to discuss this with [facility] therapist to determine best frequency that would work with their schedule.   Pt and wife receptive to HEP which was provided.  Wife indicates she feels overwhelmed at times as caregiver - support provided     Exercises Other Exercises Other Exercises: Access Code: [email or web address]    Shoulder Instructions      Home Living Family/patient expects to be discharged to:: Private residence Living Arrangements: Spouse/significant other Available Help at Discharge: Family;Available 24 hours/day Type of Home: House Home Access: Ramped entrance     Home Layout: One level     Bathroom Shower/Tub: Teacher, early years/pre: Standard     Home  Equipment: Environmental consultant - 2 wheels;Walker - 4 wheels;Cane - single point;Wheelchair - manual;Tub bench   Additional Comments: uses walker.  They have a tub transfer bench in the attic that was pt's mother in [place]       Prior Functioning/Environment Level of Independence: Needs assistance  Gait / Transfers Assistance Needed: uses walker for distances of ~20 feet.  ADL's / Homemaking Assistance Needed: wife cares for home and assists pt with his self care   Comments: Per pt and wife, he has had an up and down course for the past 4-5 years.  And, per wife, pt had the flu ~ 3 weeks ago and has not fully recovered.          OT Problem List: Decreased strength;Decreased activity tolerance;Impaired balance (sitting and/or standing);Decreased safety awareness;Decreased knowledge of use of DME or AE;Cardiopulmonary status limiting activity;Pain      OT Treatment/Interventions:      OT Goals(Current goals can be found in the care plan section) Acute Rehab OT Goals Patient Stated Goal: "to go home ASAP"  OT Goal Formulation: All assessment and education complete, DC therapy  OT Frequency:     Barriers to D/C:            Co-evaluation              AM-PAC PT "6 Clicks" Daily Activity     Outcome Measure Help from another person eating meals?: None Help from another person taking care of personal grooming?: A Lot Help from another person toileting, which includes using toliet, bedpan, or urinal?: A Lot Help from another person bathing (including washing, rinsing, drying)?: A Lot Help from another person to put on and taking off regular upper body clothing?: A Lot Help from another person to put on and taking off regular lower body clothing?: A Lot 6 Click Score: 14   End of Session Equipment Utilized During Treatment: Oxygen Nurse Communication: Mobility status  Activity Tolerance: Patient limited by fatigue Patient left: in bed;with call bell/phone within reach;with family/visitor  present  OT Visit Diagnosis: Unsteadiness on feet (R26.81);Pain Pain - Right/Left: Left Pain - part of body: Ankle and joints of foot                Time: 1226-1315 OT Time Calculation (min): 49 min Charges:  OT General Charges $OT Visit: 1 Visit OT Evaluation $OT Eval Moderate Complexity: 1 Mod OT Treatments $Self Care/Home Management : 8-22 mins $Therapeutic Exercise: 8-22 mins G-Codes:  Marlin Brys [place], OTR/L [phone]   Lucille Passy M 12/31/2017, 1:43 PM

## 2017-12-31 NOTE — Progress Notes (Signed)
Received pt from [facility], RN. HD tx initiated @ 1743 via 15Gx2 w/o problem by Almyra Brace, RN per report, pull/push/flush well w/o problem per report, VSS per report, will cont to monitor while on HD tx

## 2017-12-31 NOTE — Progress Notes (Signed)
Pt is back from HD, @2130 . Patient is being discharged home with wife. VSS, CCMD notified. Heart monitor removed, IV removed. Discharge summary and new prescription order given to patient and wife. O2 applied with patient's oxygen tank. Patient is  transported by wheelchair to the main entrance by Gwyndolyn Saxon NT to meet wife who will transport patient home.

## 2017-12-31 NOTE — Care Management Note (Signed)
Case Management Note Marvetta Gibbons RN, BSN Unit 4E-Case Manager [phone]  Patient Details  Name: Terry [name]. MRN: [id number] Date of Birth: 1934/12/16  Subjective/Objective:   Pt admitted with critical limb ischemia- s/p Left common and profunda femoral endarterectomy and Dacron patch angioplasty on 7/10              Action/Plan: PTA pt lived at home with spouse- notified by Rhys Martini with [facility] that pt has pre-op referral for [facility] needs- PT/OT evals pending. CM will follow for transition of care needs.   Expected Discharge Date:  12/31/17               Expected Discharge Plan:  [place]  In-House Referral:  NA  Discharge planning Services  CM Consult  Post Acute Care Choice:  Home Health Choice offered to:  Patient  DME Arranged:    DME Agency:     HH Arranged:  PT HH Agency:  [facility]  Status of Service:  Completed, signed off  If discussed at [place] of Stay Meetings, dates discussed:    Discharge Disposition: home/home health   Additional Comments:  12/31/16- 1430- Marvetta Gibbons RN, CM- pt for discharge home later today after HD, order placed for HHPT- spoke with pt and wife at bedside- wife has been contacted by [facility] regarding pre-op referral- is ok with services through them. CM has contacted Tiffany with [facility] regarding transition home today and start of care for [facility] services. Per wife pt has needed DME at home.   Dawayne Patricia, RN 12/31/2017, 2:46 PM

## 2017-12-31 NOTE — Evaluation (Signed)
Physical Therapy Evaluation Patient Details Name: Terry [name]. MRN: [id number] DOB: 1935/03/01 Today's Date: 12/31/2017   History of Present Illness  83 y.o. M admitted s/p left common and profunda femoral endarterectomy and Dacron patch angioplasty. PMHx:  COPD, chronic respiratory failure on PRN O2, stent, HTN, CHF, stroke, a-fib, CAD, anemia, EF 45%, HD.  Clinical Impression  Patient agreeable to limited evaluation on PT's third attempt. Patient is dependent for ADL's and uses walker at baseline for mobility. Patient and patient wife not receptive to PT recommendations or education on importance of mobility, but patient wife seems very attentive to patient needs and is available for 24/7 assist. Patient transferring to standing with walker and able to take side steps to head of bed with min guard/min assist. Increase in HR from 103-123 bpm with mobility and decrease to 86% SpO2 on RA; placed patient back on 1L O2 and returned to 94% SpO2. Refused ambulation but had previously walked with RN, Simona Huh, to door and back in room with walker and min guard assist. Will follow acutely.    Follow Up Recommendations Home health PT;Supervision for mobility/OOB    Equipment Recommendations  None recommended by PT    Recommendations for Other Services       Precautions / Restrictions Precautions Precautions: Fall Restrictions Weight Bearing Restrictions: No      Mobility  Bed Mobility Overal bed mobility: Needs Assistance Bed Mobility: Supine to Sit;Sit to Supine     Supine to sit: Min assist Sit to supine: Min assist   General bed mobility comments: Pt wife provided min assist to elevate trunk and then to return to legs to bed when lying back down. PT provided education on technique i.e. patient grabbing above caregiver's elbows rather than by the neck to prevent harm to the caregiver  Transfers Overall transfer level: Needs assistance Equipment used: Rolling walker (2  wheeled) Transfers: Sit to/from Stand Sit to Stand: Min assist         General transfer comment: Min assist provided by patient wife, however, patient did not seem to require assistance to stand. Able to take several side steps to [facility] with supervision.  Ambulation/Gait    Had previously ambulated with RN, Simona Huh, to door and back in room with walker and min guard assist.            Stairs            Wheelchair Mobility    Modified Rankin (Stroke Patients Only)       Balance Overall balance assessment: Mild deficits observed, not formally tested                                           Pertinent Vitals/Pain Pain Assessment: 0-10 Pain Score: 6  Pain Location: groin and radiating down left leg Pain Descriptors / Indicators: Guarding;Aching;Grimacing;Discomfort Pain Intervention(s): Limited activity within patient's tolerance;Monitored during session;Patient requesting pain meds-RN notified    Home Living Family/patient expects to be discharged to:: Private residence Living Arrangements: Spouse/significant other Available Help at Discharge: Family;Available 24 hours/day Type of Home: House Home Access: Ramped entrance     Home Layout: One level Home Equipment: Walker - 2 wheels;Walker - 4 wheels;Cane - single point;Wheelchair - manual Additional Comments: uses walker     Prior Function Level of Independence: Needs assistance   Gait / Transfers Assistance Needed: uses walker for distances  of ~20 feet.   ADL's / Homemaking Assistance Needed: wife cares for home and assists pt with his self care        Hand Dominance   Dominant Hand: Right    Extremity/Trunk Assessment   Upper Extremity Assessment Upper Extremity Assessment: Defer to OT evaluation    Lower Extremity Assessment Lower Extremity Assessment: LLE deficits/detail LLE Deficits / Details: expected post op deficits    Cervical / Trunk Assessment Cervical / Trunk  Assessment: Normal  Communication   Communication: HOH  Cognition Arousal/Alertness: Awake/alert Behavior During Therapy: WFL for tasks assessed/performed Overall Cognitive Status: Within Functional Limits for tasks assessed                                 General Comments: Patient extremely difficult to motivate to participate and doesn't understand need for therapy or mobliity.      General Comments      Exercises Other Exercises Other Exercises: Instructed patient on bed level exercises i.e. heel slides, SLR, hip abduction/adduction on patient wife request, however, patient refused to perform   Assessment/Plan    PT Assessment Patient needs continued PT services  PT Problem List Decreased strength;Decreased activity tolerance;Decreased balance;Decreased mobility;Pain       PT Treatment Interventions DME instruction;Gait training;Functional mobility training;Therapeutic activities;Therapeutic exercise;Balance training;Patient/family education    PT Goals (Current goals can be found in the Care Plan section)  Acute Rehab PT Goals Patient Stated Goal: be independent in 2 weeks PT Goal Formulation: With patient Time For Goal Achievement: 01/14/18 Potential to Achieve Goals: Fair    Frequency Min 3X/week   Barriers to discharge        Co-evaluation               AM-PAC PT "6 Clicks" Daily Activity  Outcome Measure Difficulty turning over in bed (including adjusting bedclothes, sheets and blankets)?: None Difficulty moving from lying on back to sitting on the side of the bed? : Unable Difficulty sitting down on and standing up from a chair with arms (e.g., wheelchair, bedside commode, etc,.)?: Unable Help needed moving to and from a bed to chair (including a wheelchair)?: A Little Help needed walking in hospital room?: A Little Help needed climbing 3-5 steps with a railing? : A Lot 6 Click Score: 14    End of Session Equipment Utilized During  Treatment: Oxygen Activity Tolerance: Other (comment);Patient limited by pain(Patient with self limitation) Patient left: in bed;with call bell/phone within reach;with family/visitor present Nurse Communication: Mobility status PT Visit Diagnosis: Difficulty in walking, not elsewhere classified (R26.2);Other abnormalities of gait and mobility (R26.89)    Time: 4888-9169 PT Time Calculation (min) (ACUTE ONLY): 21 min   Charges:   PT Evaluation $PT Eval Moderate Complexity: 1 Mod     PT G Codes:        Ellamae Sia, PT, DPT Acute Rehabilitation Services  Pager: [place] 12/31/2017, 10:05 AM

## 2017-12-31 NOTE — Progress Notes (Addendum)
  Progress Note    12/31/2017 7:14 AM 2 Days Post-Op  Subjective:  Pain in groin improved overnight   Vitals:   12/31/17 0009 12/31/17 0556  BP: 138/70 130/61  Pulse: 91 92  Resp: 16 (!) 21  Temp: (!) 97.5 F (36.4 C) 98 F (36.7 C)  SpO2: 97% 95%   Physical Exam: Cardiac:  RRR Lungs:  Non labored Incisions:  L groin incision local ecchymosis; incision soft without drainage Extremities:  Feet symmetrically warm to touch with good capillary refill Abdomen:  Soft Neurologic: A&O  CBC    Component Value Date/Time   WBC 9.2 12/30/2017 0443   RBC 3.03 (L) 12/30/2017 0443   HGB 9.1 (L) 12/30/2017 0443   HCT 31.1 (L) 12/30/2017 0443   PLT 222 12/30/2017 0443   MCV 102.6 (H) 12/30/2017 0443   MCH 30.0 12/30/2017 0443   MCHC 29.3 (L) 12/30/2017 0443   RDW 21.3 (H) 12/30/2017 0443   LYMPHSABS 0.1 (L) 11/29/2017 1910   MONOABS 0.6 11/29/2017 1910   EOSABS 0.1 11/29/2017 1910   BASOSABS 0.0 11/29/2017 1910    BMET    Component Value Date/Time   NA 137 12/30/2017 0443   K 4.7 12/30/2017 0443   CL 94 (L) 12/30/2017 0443   CO2 25 12/30/2017 0443   GLUCOSE 141 (H) 12/30/2017 0443   BUN 77 (H) 12/30/2017 0443   CREATININE 7.66 (H) 12/30/2017 0443   CREATININE 2.62 (H) 05/25/2014 1711   CALCIUM 8.1 (L) 12/30/2017 0443   GFRNONAA 6 (L) 12/30/2017 0443   GFRAA 7 (L) 12/30/2017 0443    INR    Component Value Date/Time   INR 1.06 12/28/2017 1431     Intake/Output Summary (Last 24 hours) at 12/31/2017 0714 Last data filed at 12/30/2017 1816 Gross per 24 hour  Intake 480 ml  Output 3575 ml  Net -3095 ml    Assessment/Plan:  83 y.o. male is s/p L CFA endarterectomy 2 Days Post-Op   Perfusing BLE well by physical exam L groin incision with bruising but soft and without drainage Patient did not participate with therapy teams yesterday due to pain D/c possibly this afternoon if mobility improves   Dagoberto Ligas, PA-C [facility] [phone] 12/31/2017 7:14 AM  I have examined the patient, reviewed and agree with above.  Comfortable walking.  Ready for discharge to home today.  Will have hemodialysis prior to discharge per nephrology  Curt Jews, MD 12/31/2017 12:49 PM

## 2018-01-01 DIAGNOSIS — J449 Chronic obstructive pulmonary disease, unspecified: Secondary | ICD-10-CM | POA: Diagnosis not present

## 2018-01-02 DIAGNOSIS — Z992 Dependence on renal dialysis: Secondary | ICD-10-CM | POA: Diagnosis not present

## 2018-01-02 DIAGNOSIS — I5042 Chronic combined systolic (congestive) and diastolic (congestive) heart failure: Secondary | ICD-10-CM | POA: Diagnosis not present

## 2018-01-02 DIAGNOSIS — I132 Hypertensive heart and chronic kidney disease with heart failure and with stage 5 chronic kidney disease, or end stage renal disease: Secondary | ICD-10-CM | POA: Diagnosis not present

## 2018-01-02 DIAGNOSIS — J441 Chronic obstructive pulmonary disease with (acute) exacerbation: Secondary | ICD-10-CM | POA: Diagnosis not present

## 2018-01-02 DIAGNOSIS — N186 End stage renal disease: Secondary | ICD-10-CM | POA: Diagnosis not present

## 2018-01-02 DIAGNOSIS — I482 Chronic atrial fibrillation: Secondary | ICD-10-CM | POA: Diagnosis not present

## 2018-01-02 DIAGNOSIS — Z48812 Encounter for surgical aftercare following surgery on the circulatory system: Secondary | ICD-10-CM | POA: Diagnosis not present

## 2018-01-02 DIAGNOSIS — J9611 Chronic respiratory failure with hypoxia: Secondary | ICD-10-CM | POA: Diagnosis not present

## 2018-01-02 DIAGNOSIS — I2510 Atherosclerotic heart disease of native coronary artery without angina pectoris: Secondary | ICD-10-CM | POA: Diagnosis not present

## 2018-01-03 ENCOUNTER — Ambulatory Visit: Payer: Medicare PPO | Admitting: Surgery

## 2018-01-03 DIAGNOSIS — N2581 Secondary hyperparathyroidism of renal origin: Secondary | ICD-10-CM | POA: Diagnosis not present

## 2018-01-03 DIAGNOSIS — N186 End stage renal disease: Secondary | ICD-10-CM | POA: Diagnosis not present

## 2018-01-04 ENCOUNTER — Ambulatory Visit: Payer: Medicare PPO | Admitting: Vascular Surgery

## 2018-01-05 DIAGNOSIS — N186 End stage renal disease: Secondary | ICD-10-CM | POA: Diagnosis not present

## 2018-01-05 DIAGNOSIS — N2581 Secondary hyperparathyroidism of renal origin: Secondary | ICD-10-CM | POA: Diagnosis not present

## 2018-01-06 DIAGNOSIS — J441 Chronic obstructive pulmonary disease with (acute) exacerbation: Secondary | ICD-10-CM | POA: Diagnosis not present

## 2018-01-06 DIAGNOSIS — I5042 Chronic combined systolic (congestive) and diastolic (congestive) heart failure: Secondary | ICD-10-CM | POA: Diagnosis not present

## 2018-01-06 DIAGNOSIS — I482 Chronic atrial fibrillation: Secondary | ICD-10-CM | POA: Diagnosis not present

## 2018-01-06 DIAGNOSIS — I132 Hypertensive heart and chronic kidney disease with heart failure and with stage 5 chronic kidney disease, or end stage renal disease: Secondary | ICD-10-CM | POA: Diagnosis not present

## 2018-01-06 DIAGNOSIS — I2510 Atherosclerotic heart disease of native coronary artery without angina pectoris: Secondary | ICD-10-CM | POA: Diagnosis not present

## 2018-01-06 DIAGNOSIS — J9611 Chronic respiratory failure with hypoxia: Secondary | ICD-10-CM | POA: Diagnosis not present

## 2018-01-06 DIAGNOSIS — Z48812 Encounter for surgical aftercare following surgery on the circulatory system: Secondary | ICD-10-CM | POA: Diagnosis not present

## 2018-01-06 DIAGNOSIS — N186 End stage renal disease: Secondary | ICD-10-CM | POA: Diagnosis not present

## 2018-01-06 DIAGNOSIS — Z992 Dependence on renal dialysis: Secondary | ICD-10-CM | POA: Diagnosis not present

## 2018-01-07 DIAGNOSIS — N2581 Secondary hyperparathyroidism of renal origin: Secondary | ICD-10-CM | POA: Diagnosis not present

## 2018-01-07 DIAGNOSIS — N186 End stage renal disease: Secondary | ICD-10-CM | POA: Diagnosis not present

## 2018-01-10 DIAGNOSIS — N2581 Secondary hyperparathyroidism of renal origin: Secondary | ICD-10-CM | POA: Diagnosis not present

## 2018-01-10 DIAGNOSIS — N186 End stage renal disease: Secondary | ICD-10-CM | POA: Diagnosis not present

## 2018-01-11 ENCOUNTER — Encounter: Payer: Medicare PPO | Admitting: Vascular Surgery

## 2018-01-11 ENCOUNTER — Encounter: Payer: Self-pay | Admitting: Podiatry

## 2018-01-11 ENCOUNTER — Ambulatory Visit: Payer: Medicare PPO | Admitting: Podiatry

## 2018-01-11 ENCOUNTER — Encounter (HOSPITAL_COMMUNITY): Payer: Medicare PPO

## 2018-01-11 DIAGNOSIS — B351 Tinea unguium: Secondary | ICD-10-CM | POA: Diagnosis not present

## 2018-01-11 DIAGNOSIS — M79676 Pain in unspecified toe(s): Secondary | ICD-10-CM

## 2018-01-11 NOTE — Progress Notes (Signed)
This patient presents the office for evaluation and treatment of long thick painful nails.  He says that the nails are painful walking and wearing his shoes on his right foot.  [name] He says that he is had vascular surgery approximately 4 weeks ago, which has helped to decrease the generalized pain in his left foot.  He also says that his vascular doctor told him tall the nails to pop off and then regrow. He presents for preventative foot care services.   GENERAL APPEARANCE: Alert, conversant. Appropriately groomed. No acute distress.  VASCULAR: Pedal pulses are  palpable at  [facility] and PT bilateral .  Capillary refill time is immediate to all digits,  Normal temperature gradient.  Swelling feet  B/L NEUROLOGIC: sensation is normal to 5.07 monofilament at 5/5 sites bilateral.  Light touch is intact bilateral, Muscle strength normal.  MUSCULOSKELETAL: acceptable muscle strength, tone and stability bilateral.  Intrinsic muscluature intact bilateral.  Rectus appearance of foot and digits noted bilateral.   DERMATOLOGIC: skin color, texture, and turgor are within normal limits.  No preulcerative lesions or ulcers  are seen, no interdigital maceration noted.  No open lesions present.  . No drainage noted.  NAILS  Thick disfigured discolored nails, right foot.  Patient has thick discolored disfigured toenails, fourth and fifth toes, left foot.  Callus noted 1,2,3 left foot which is healing with no evidence of infection.  Onychomycosis x  7.  Healing vascular surgery left foot.  ROV  Debridement and grinding of long thick painful nails.   RTC 3 months.     Gardiner Barefoot DPM

## 2018-01-12 DIAGNOSIS — N2581 Secondary hyperparathyroidism of renal origin: Secondary | ICD-10-CM | POA: Diagnosis not present

## 2018-01-12 DIAGNOSIS — N186 End stage renal disease: Secondary | ICD-10-CM | POA: Diagnosis not present

## 2018-01-13 DIAGNOSIS — M6281 Muscle weakness (generalized): Secondary | ICD-10-CM | POA: Diagnosis not present

## 2018-01-13 DIAGNOSIS — R2681 Unsteadiness on feet: Secondary | ICD-10-CM | POA: Diagnosis not present

## 2018-01-13 DIAGNOSIS — D500 Iron deficiency anemia secondary to blood loss (chronic): Secondary | ICD-10-CM | POA: Diagnosis not present

## 2018-01-13 DIAGNOSIS — I2510 Atherosclerotic heart disease of native coronary artery without angina pectoris: Secondary | ICD-10-CM | POA: Diagnosis not present

## 2018-01-13 DIAGNOSIS — K922 Gastrointestinal hemorrhage, unspecified: Secondary | ICD-10-CM | POA: Diagnosis not present

## 2018-01-13 DIAGNOSIS — Z992 Dependence on renal dialysis: Secondary | ICD-10-CM | POA: Diagnosis not present

## 2018-01-13 DIAGNOSIS — J449 Chronic obstructive pulmonary disease, unspecified: Secondary | ICD-10-CM | POA: Diagnosis not present

## 2018-01-13 DIAGNOSIS — L0390 Cellulitis, unspecified: Secondary | ICD-10-CM | POA: Diagnosis not present

## 2018-01-13 DIAGNOSIS — I5031 Acute diastolic (congestive) heart failure: Secondary | ICD-10-CM | POA: Diagnosis not present

## 2018-01-14 ENCOUNTER — Telehealth: Payer: Self-pay

## 2018-01-14 DIAGNOSIS — N186 End stage renal disease: Secondary | ICD-10-CM | POA: Diagnosis not present

## 2018-01-14 DIAGNOSIS — N2581 Secondary hyperparathyroidism of renal origin: Secondary | ICD-10-CM | POA: Diagnosis not present

## 2018-01-14 NOTE — Telephone Encounter (Signed)
Kim, RN with [facility], called to inform us that they have made several attempts to follow up with patient but has been unsuccessful. Patient is refusing their services. Patient's family member asked that they try one more time next week which they will.

## 2018-01-17 DIAGNOSIS — N186 End stage renal disease: Secondary | ICD-10-CM | POA: Diagnosis not present

## 2018-01-17 DIAGNOSIS — N2581 Secondary hyperparathyroidism of renal origin: Secondary | ICD-10-CM | POA: Diagnosis not present

## 2018-01-18 ENCOUNTER — Encounter: Payer: Self-pay | Admitting: Vascular Surgery

## 2018-01-18 ENCOUNTER — Ambulatory Visit (INDEPENDENT_AMBULATORY_CARE_PROVIDER_SITE_OTHER): Payer: Self-pay | Admitting: Vascular Surgery

## 2018-01-18 ENCOUNTER — Other Ambulatory Visit: Payer: Self-pay

## 2018-01-18 VITALS — BP 124/69 | HR 87 | Temp 97.6°F | Resp 18 | Ht 67.5 in | Wt 153.0 lb

## 2018-01-18 DIAGNOSIS — I739 Peripheral vascular disease, unspecified: Secondary | ICD-10-CM

## 2018-01-18 NOTE — Progress Notes (Signed)
Patient name: Terry [name]. MRN: [id number] DOB: 1934/11/22 Sex: male  REASON FOR VISIT: Follow-up left common and profunda femoris endarterectomy and Dacron patch angioplasty on 12/29/2017  HPI: Terry Kost. is a 83 y.o. male for follow-up.  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  He underwent uneventful endarterectomy and patch angioplasty.  He did well in the hospital and was discharged home without complication.  He does report some continued soreness over the medial aspect of his thigh on the left and I explained that this is related to the irritation of the sensory nerves in his thigh from the groin incision.  He has had no further rest pain in his left foot.  Current Outpatient Medications  Medication Sig Dispense Refill  . acetaminophen (TYLENOL) 500 MG tablet Take 1,000 mg by mouth every 6 (six) hours as needed for mild pain or headache.     . albuterol (PROVENTIL HFA;VENTOLIN HFA) 108 (90 Base) MCG/ACT inhaler Inhale 2 puffs into the lungs every 4 (four) hours as needed for wheezing or shortness of breath. 1 Inhaler 0  . albuterol (PROVENTIL) (2.5 MG/3ML) 0.083% nebulizer solution Take 3 mLs (2.5 mg total) by nebulization every 4 (four) hours as needed for wheezing or shortness of breath. 75 mL 0  . aspirin EC 81 MG tablet Take 81 mg by mouth daily.    [name] atorvastatin (LIPITOR) 80 MG tablet Take 1 tablet (80 mg total) by mouth daily. 90 tablet 3  . budesonide-formoterol (SYMBICORT) 160-4.5 MCG/ACT inhaler Inhale 2 puffs into the lungs 2 (two) times daily.    . calcium acetate (PHOSLO) 667 MG capsule Take 667-1,334 mg by mouth See admin instructions. Take 1334 mg by mouth 3 times daily with each meal and take 667 mg by mouth with each snack    . cetirizine (ZYRTEC) 10 MG tablet Take 10 mg by mouth daily as needed for allergies.     [name] clopidogrel (PLAVIX) 75 MG tablet Take 1 tablet (75 mg total) by mouth daily. 90 tablet 3  .  dextromethorphan-guaiFENesin (MUCINEX DM) 30-600 MG 12hr tablet Take 1 tablet by mouth 2 (two) times daily.     [name] ethyl chloride spray Apply 1 application topically every Monday, Wednesday, and Friday.     . famotidine (PEPCID) 20 MG tablet Take 1 tablet (20 mg total) by mouth 2 (two) times daily as needed for heartburn or indigestion. 30 tablet 1  . Glycopyrrolate-Formoterol (BEVESPI AEROSPHERE) 9-4.8 MCG/ACT AERO Inhale 2 puffs into the lungs 2 (two) times daily. 1 Inhaler 11  . methocarbamol (ROBAXIN) 500 MG tablet Take 500 mg by mouth 2 (two) times daily as needed for muscle spasms.    . metoprolol succinate (TOPROL-XL) 50 MG 24 hr tablet Take 50 mg by mouth 2 (two) times daily. Take with or immediately following a meal.     . midodrine (PROAMATINE) 10 MG tablet Take 10 mg by mouth See admin instructions. Take 10 mg by mouth on Mon/Wed/Fri at dialysis as needed for low blood pressure    . nystatin (MYCOSTATIN) 100000 UNIT/ML suspension     . OXYGEN Inhale 1 L into the lungs daily as needed (for shortness of breath).     [name] oxymetazoline (AFRIN) 0.05 % nasal spray Place 1 spray into both nostrils once as needed (to stop nose bleeds).     . predniSONE (DELTASONE) 10 MG tablet Take 6 tablets (60 mg total) by mouth daily with breakfast. And decrease by one  tablet daily, then continue Prednisone 10 mg po daily (Patient taking differently: Take 10 mg by mouth daily with breakfast. ) 21 tablet 0  . oxyCODONE-acetaminophen (PERCOCET/ROXICET) 5-325 MG tablet Take 1 tablet by mouth every 6 (six) hours as needed for moderate pain. (Patient not taking: Reported on 01/18/2018) 15 tablet 0   No current facility-administered medications for this visit.      PHYSICAL EXAM: Vitals:   01/18/18 1231  BP: 124/69  Pulse: 87  Resp: 18  Temp: 97.6 F (36.4 C)  TempSrc: Oral  SpO2: 95%  Weight: 153 lb (69.4 kg)  Height: 5' 7.5" (1.715 m)    GENERAL: The patient is a well-nourished male, in no acute  distress. The vital signs are documented above. Groin incision healed with normal 2+ femoral pulse.  I do not palpate pedal pulses.  His toenails are healing.  These were all quite excoriated prior to surgery and he does not have the same dependent rubor and erythema in his left foot as was seen preoperatively  MEDICAL ISSUES: Good initial result for endarterectomy for improvement of flow for his critical limb ischemia.  We will continue his usual activities.  We will see him again in 3 months with ankle arm index at that time and he will notify should he develop any wound issues or other issues related to ischemia   Rosetta Posner, MD [facility] Tel [phone] Pager [phone]

## 2018-01-19 DIAGNOSIS — E1122 Type 2 diabetes mellitus with diabetic chronic kidney disease: Secondary | ICD-10-CM | POA: Diagnosis not present

## 2018-01-19 DIAGNOSIS — N2581 Secondary hyperparathyroidism of renal origin: Secondary | ICD-10-CM | POA: Diagnosis not present

## 2018-01-19 DIAGNOSIS — Z992 Dependence on renal dialysis: Secondary | ICD-10-CM | POA: Diagnosis not present

## 2018-01-19 DIAGNOSIS — N186 End stage renal disease: Secondary | ICD-10-CM | POA: Diagnosis not present

## 2018-01-21 DIAGNOSIS — N186 End stage renal disease: Secondary | ICD-10-CM | POA: Diagnosis not present

## 2018-01-21 DIAGNOSIS — N2581 Secondary hyperparathyroidism of renal origin: Secondary | ICD-10-CM | POA: Diagnosis not present

## 2018-01-24 DIAGNOSIS — N2581 Secondary hyperparathyroidism of renal origin: Secondary | ICD-10-CM | POA: Diagnosis not present

## 2018-01-24 DIAGNOSIS — N186 End stage renal disease: Secondary | ICD-10-CM | POA: Diagnosis not present

## 2018-01-26 DIAGNOSIS — N186 End stage renal disease: Secondary | ICD-10-CM | POA: Diagnosis not present

## 2018-01-26 DIAGNOSIS — N2581 Secondary hyperparathyroidism of renal origin: Secondary | ICD-10-CM | POA: Diagnosis not present

## 2018-01-27 ENCOUNTER — Other Ambulatory Visit: Payer: Self-pay

## 2018-01-27 DIAGNOSIS — I739 Peripheral vascular disease, unspecified: Secondary | ICD-10-CM

## 2018-01-28 DIAGNOSIS — N2581 Secondary hyperparathyroidism of renal origin: Secondary | ICD-10-CM | POA: Diagnosis not present

## 2018-01-28 DIAGNOSIS — N186 End stage renal disease: Secondary | ICD-10-CM | POA: Diagnosis not present

## 2018-01-31 ENCOUNTER — Telehealth: Payer: Self-pay | Admitting: *Deleted

## 2018-01-31 DIAGNOSIS — N2581 Secondary hyperparathyroidism of renal origin: Secondary | ICD-10-CM | POA: Diagnosis not present

## 2018-01-31 DIAGNOSIS — N186 End stage renal disease: Secondary | ICD-10-CM | POA: Diagnosis not present

## 2018-01-31 NOTE — Telephone Encounter (Signed)
Patient called to c/o "soreness, redness at left groin" and needs to be checked. Patient was seen by Dr. Donnetta Hutching about a week ago. Area irritated from clothing according to patient. Instructed to cover to prevent irritation and loose clothing. Denies any heat or drainage, fever or chills. Agreeable to see NP for 02/01/18 for wound check.

## 2018-02-01 ENCOUNTER — Encounter: Payer: Self-pay | Admitting: Family

## 2018-02-01 ENCOUNTER — Other Ambulatory Visit: Payer: Self-pay

## 2018-02-01 ENCOUNTER — Ambulatory Visit (INDEPENDENT_AMBULATORY_CARE_PROVIDER_SITE_OTHER): Payer: Self-pay | Admitting: Family

## 2018-02-01 VITALS — BP 133/70 | HR 83 | Temp 97.5°F | Resp 20 | Ht 67.5 in | Wt 151.0 lb

## 2018-02-01 DIAGNOSIS — J449 Chronic obstructive pulmonary disease, unspecified: Secondary | ICD-10-CM | POA: Diagnosis not present

## 2018-02-01 DIAGNOSIS — I739 Peripheral vascular disease, unspecified: Secondary | ICD-10-CM

## 2018-02-01 MED ORDER — CEPHALEXIN 500 MG PO CAPS: 500.0000 mg | ORAL_CAPSULE | Freq: Two times a day (BID) | ORAL | 0 refills | Status: DC

## 2018-02-01 NOTE — Progress Notes (Signed)
Postoperative Visit   History of Present Illness  Terry [name]. is a 83 y.o. year old male who is s/p left common and profunda femoris endarterectomy and Dacron patch angioplasty on 12/29/2017 by Dr. Donnetta Hutching for critical limb ischemia. 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  He underwent uneventful endarterectomy and patch angioplasty.  He did well in the hospital and was discharged home without complication.   He has had no further rest pain in his left foot.  He returns today with c/o "soreness, redness at left groin" and needs to be checked. Patient was seen by Dr. Donnetta Hutching about a week ago. Area irritated from clothing according to patient. Instructed to cover to prevent irritation and loose clothing.  He denies fever or chills.  He is here with wife.   He is taking prednisone for COPD continuously for about a year which inhibits surgical healing.    He has an appointment on 03-29-18 for ABI's and see Dr. Donnetta Hutching.   He dialyzes M-W-F via right upper arm access.  The patient is able to complete his activities of daily living.     For VQI Use Only  PRE-ADM LIVING: Home  AMB STATUS: Ambulatory    Past Medical History:  Diagnosis Date  . Acute and chronic respiratory failure 10/22/2012  . Anxiety   . Arthritis    "hands, knees" (03/15/2017)  . Atrial fibrillation ([place])   . CAD (coronary artery disease) [place], [name]  . CHF (congestive heart failure) ([place]) 09/2012  . COPD (chronic obstructive pulmonary disease) ([place])   . ESRD (end stage renal disease) on dialysis [facility])    "MWF; [facility], [place]" (03/15/2017)  . Flu 09/24/2016   influenza b  . HCAP (healthcare-associated pneumonia) 03/15/2017  . High cholesterol   . History of blood transfusion 2012   "related to bowel resection"  . Hypertension   . On home oxygen therapy    "1L typically at night" (03/15/2017)  . Osteoarthritis 10/22/2012  . Pneumonia April 2014; 09/2016  . Shortness of  breath dyspnea   . Stroke [facility]) 2012   denies residual on 03/15/2017  . Stroke [facility]) ~ 2013   "small one in his right eye"    Past Surgical History:  Procedure Laterality Date  . ABDOMINAL AORTOGRAM N/A 12/16/2017   Procedure: ABDOMINAL AORTOGRAM;  Surgeon: Conrad Le Roy, MD;  Location: [place] [facility];  Service: Cardiovascular;  Laterality: N/A;  . APPENDECTOMY  1984  . AV FISTULA PLACEMENT Right 05/21/2015   Procedure: Right Arm Brachiocephalic ARTERIOVENOUS (AV) FISTULA CREATION;  Surgeon: Angelia Mould, MD;  Location: [place];  Service: Vascular;  Laterality: Right;  . CATARACT EXTRACTION W/ INTRAOCULAR LENS  IMPLANT, BILATERAL Bilateral   . COLECTOMY  2012   partial colon removed - twisted bowel  . CORONARY ANGIOPLASTY WITH STENT PLACEMENT  1999   2 stents  . CYSTOSCOPY/RETROGRADE/URETEROSCOPY Bilateral 04/23/2014   Procedure: CYSTOSCOPY BILATERAL RETROGRADE,  LEFT URETEROSCOPY, LEFT STENT PLACEMENT;  Surgeon: Raynelle Bring, MD;  Location: [facility];  Service: Urology;  Laterality: Bilateral;  . ENDARTERECTOMY FEMORAL Left 12/29/2017   Procedure: Left Femoral ENDARTERECTOMY with Patch Angioplasty using Xenosure patch;  Surgeon: Rosetta Posner, MD;  Location: [place];  Service: Vascular;  Laterality: Left;  . EYE SURGERY Left    "surgery to clean off lens after cataract OR"  . INGUINAL HERNIA REPAIR Right   . LOWER EXTREMITY ANGIOGRAPHY Bilateral 12/16/2017  Procedure: LOWER EXTREMITY ANGIOGRAPHY;  Surgeon: Conrad Belle Center, MD;  Location: [place] [facility];  Service: Cardiovascular;  Laterality: Bilateral;    Family History  Problem Relation Age of Onset  . Cancer Mother        Breast  . Heart disease Father   . Heart attack Father   . Parkinson's disease Brother     Social History   Socioeconomic History  . Marital status: Married    Spouse name: Not on file  . Number of children: 3  . Years of education: Not on file  . Highest education level: Not on file    Occupational History  . Occupation: retired-worked in Writer: RETIRED  Social Needs  . Financial resource strain: Not on file  . Food insecurity:    Worry: Not on file    Inability: Not on file  . Transportation needs:    Medical: Not on file    Non-medical: Not on file  Tobacco Use  . Smoking status: Former Smoker    Packs/day: 1.00    Years: 49.00    Pack years: 49.00    Types: Cigarettes, Pipe, Cigars    Last attempt to quit: 02/21/2000    Years since quitting: 17.9  . Smokeless tobacco: Former Systems developer    Types: Chew  . Tobacco comment: "chewed when he was a kid"  Substance and Sexual Activity  . Alcohol use: Yes    Comment: 03/15/2017 "nothing since the 1960's"  . Drug use: No  . Sexual activity: Never  Lifestyle  . Physical activity:    Days per week: Not on file    Minutes per session: Not on file  . Stress: Not on file  Relationships  . Social connections:    Talks on phone: Not on file    Gets together: Not on file    Attends religious service: Not on file    Active member of club or organization: Not on file    Attends meetings of clubs or organizations: Not on file    Relationship status: Not on file  . Intimate partner violence:    Fear of current or ex partner: Not on file    Emotionally abused: Not on file    Physically abused: Not on file    Forced sexual activity: Not on file  Other Topics Concern  . Not on file  Social History Narrative   Work or School: retired [place] Situation: lives with wife in [place] Beliefs: [place] regular; poor diet          Allergies  Allergen Reactions  . Yellow Dyes (Non-Tartrazine) Other (See Comments)    Burns arms; Cough medications (tussionex and benzonatate ok) specific - yellow dye used for ophthalmology procedure   . Levaquin [Levofloxacin In D5w] Itching  . Levofloxacin Nausea And Vomiting     Current Outpatient Medications on File  Prior to Visit  Medication Sig Dispense Refill  . acetaminophen (TYLENOL) 500 MG tablet Take 1,000 mg by mouth every 6 (six) hours as needed for mild pain or headache.     . albuterol (PROVENTIL HFA;VENTOLIN HFA) 108 (90 Base) MCG/ACT inhaler Inhale 2 puffs into the lungs every 4 (four) hours as needed for wheezing or shortness of breath. 1 Inhaler 0  . albuterol (PROVENTIL) (2.5 MG/3ML) 0.083% nebulizer solution Take 3 mLs (2.5 mg total) by  nebulization every 4 (four) hours as needed for wheezing or shortness of breath. 75 mL 0  . aspirin EC 81 MG tablet Take 81 mg by mouth daily.    [name] atorvastatin (LIPITOR) 80 MG tablet Take 1 tablet (80 mg total) by mouth daily. 90 tablet 3  . budesonide-formoterol (SYMBICORT) 160-4.5 MCG/ACT inhaler Inhale 2 puffs into the lungs 2 (two) times daily.    . calcium acetate (PHOSLO) 667 MG capsule Take 667-1,334 mg by mouth See admin instructions. Take 1334 mg by mouth 3 times daily with each meal and take 667 mg by mouth with each snack    . cetirizine (ZYRTEC) 10 MG tablet Take 10 mg by mouth daily as needed for allergies.     [name] clopidogrel (PLAVIX) 75 MG tablet Take 1 tablet (75 mg total) by mouth daily. 90 tablet 3  . dextromethorphan-guaiFENesin (MUCINEX DM) 30-600 MG 12hr tablet Take 1 tablet by mouth 2 (two) times daily.     [name] ethyl chloride spray Apply 1 application topically every Monday, Wednesday, and Friday.     . famotidine (PEPCID) 20 MG tablet Take 1 tablet (20 mg total) by mouth 2 (two) times daily as needed for heartburn or indigestion. 30 tablet 1  . Glycopyrrolate-Formoterol (BEVESPI AEROSPHERE) 9-4.8 MCG/ACT AERO Inhale 2 puffs into the lungs 2 (two) times daily. 1 Inhaler 11  . methocarbamol (ROBAXIN) 500 MG tablet Take 500 mg by mouth 2 (two) times daily as needed for muscle spasms.    . metoprolol succinate (TOPROL-XL) 50 MG 24 hr tablet Take 50 mg by mouth 2 (two) times daily. Take with or immediately following a meal.     . midodrine  (PROAMATINE) 10 MG tablet Take 10 mg by mouth See admin instructions. Take 10 mg by mouth on Mon/Wed/Fri at dialysis as needed for low blood pressure    . nystatin (MYCOSTATIN) 100000 UNIT/ML suspension     . OXYGEN Inhale 1 L into the lungs daily as needed (for shortness of breath).     [name] oxymetazoline (AFRIN) 0.05 % nasal spray Place 1 spray into both nostrils once as needed (to stop nose bleeds).     . predniSONE (DELTASONE) 10 MG tablet Take 6 tablets (60 mg total) by mouth daily with breakfast. And decrease by one tablet daily, then continue Prednisone 10 mg po daily (Patient taking differently: Take 10 mg by mouth daily with breakfast. ) 21 tablet 0   No current facility-administered medications on file prior to visit.      Physical Examination  Vitals:   02/01/18 1019  BP: 133/70  Pulse: 83  Resp: 20  Temp: (!) 97.5 F (36.4 C)  TempSrc: Oral  SpO2: 95%  Weight: 151 lb (68.5 kg)  Height: 5' 7.5" (1.715 m)   Body mass index is 23.3 kg/m.   Left groin incision with mild erythema and moderate swelling of skin and subcutaneous tissue, tender to palpation, no drainage, no purulence, no separation of incision. Does not seem like hematoma is present, pt would not allow for palpation of pulse in the left groin, right groin pulse is 2+ palpable.  Pedal pulses: right: + Doppler signal DP and PT, Left: + Doppler signal PT and peroneal.   Medical Decision Making  Terry [name]. is a 83 y.o. year old male who presents s/p left common and profunda femoris endarterectomy and Dacron patch angioplasty on 12/29/2017 by Dr. Donnetta Hutching for critical limb ischemia.   Dr. Donnetta Hutching spoke with pt and wife  and examined pt. Keflex 500 mg po bid (he is on hemodialysis), x 10 days. Pt instructed to wear boxer underwear instead of briefs, since briefs tend to rub in the groin.  Shower daily with soap.  Return as scheduled in October 2019 with ABI's and see Dr. Donnetta Hutching. Notify us for further concerns.   I  discussed in depth with the patient the nature of atherosclerosis, and emphasized the importance of maximal medical management including strict control of blood pressure, blood glucose, and lipid levels, obtaining regular exercise, and cessation of smoking.  The patient is aware that without maximal medical management the underlying atherosclerotic disease process will progress, limiting the benefit of any interventions.    I emphasized the importance of routine surveillance of the patient's bypass, as the vascular surgery literature emphasize the improved patency possible with assisted primary patency procedures versus secondary patency procedures.   Thank you for allowing [place] to participate in this patient's care.  Clemon Chambers, RN, MSN, FNP-C [facility] [place] Office: [phone]  02/01/2018, 10:47 AM  Clinic MD: Early

## 2018-02-02 DIAGNOSIS — N186 End stage renal disease: Secondary | ICD-10-CM | POA: Diagnosis not present

## 2018-02-02 DIAGNOSIS — N2581 Secondary hyperparathyroidism of renal origin: Secondary | ICD-10-CM | POA: Diagnosis not present

## 2018-02-04 DIAGNOSIS — N186 End stage renal disease: Secondary | ICD-10-CM | POA: Diagnosis not present

## 2018-02-04 DIAGNOSIS — N2581 Secondary hyperparathyroidism of renal origin: Secondary | ICD-10-CM | POA: Diagnosis not present

## 2018-02-07 DIAGNOSIS — N186 End stage renal disease: Secondary | ICD-10-CM | POA: Diagnosis not present

## 2018-02-07 DIAGNOSIS — N2581 Secondary hyperparathyroidism of renal origin: Secondary | ICD-10-CM | POA: Diagnosis not present

## 2018-02-09 DIAGNOSIS — N186 End stage renal disease: Secondary | ICD-10-CM | POA: Diagnosis not present

## 2018-02-09 DIAGNOSIS — N2581 Secondary hyperparathyroidism of renal origin: Secondary | ICD-10-CM | POA: Diagnosis not present

## 2018-02-11 DIAGNOSIS — N186 End stage renal disease: Secondary | ICD-10-CM | POA: Diagnosis not present

## 2018-02-11 DIAGNOSIS — N2581 Secondary hyperparathyroidism of renal origin: Secondary | ICD-10-CM | POA: Diagnosis not present

## 2018-02-13 DIAGNOSIS — J449 Chronic obstructive pulmonary disease, unspecified: Secondary | ICD-10-CM | POA: Diagnosis not present

## 2018-02-13 DIAGNOSIS — Z992 Dependence on renal dialysis: Secondary | ICD-10-CM | POA: Diagnosis not present

## 2018-02-13 DIAGNOSIS — D500 Iron deficiency anemia secondary to blood loss (chronic): Secondary | ICD-10-CM | POA: Diagnosis not present

## 2018-02-13 DIAGNOSIS — I2510 Atherosclerotic heart disease of native coronary artery without angina pectoris: Secondary | ICD-10-CM | POA: Diagnosis not present

## 2018-02-13 DIAGNOSIS — K922 Gastrointestinal hemorrhage, unspecified: Secondary | ICD-10-CM | POA: Diagnosis not present

## 2018-02-13 DIAGNOSIS — L0390 Cellulitis, unspecified: Secondary | ICD-10-CM | POA: Diagnosis not present

## 2018-02-13 DIAGNOSIS — I5031 Acute diastolic (congestive) heart failure: Secondary | ICD-10-CM | POA: Diagnosis not present

## 2018-02-13 DIAGNOSIS — M6281 Muscle weakness (generalized): Secondary | ICD-10-CM | POA: Diagnosis not present

## 2018-02-13 DIAGNOSIS — R2681 Unsteadiness on feet: Secondary | ICD-10-CM | POA: Diagnosis not present

## 2018-02-14 DIAGNOSIS — N186 End stage renal disease: Secondary | ICD-10-CM | POA: Diagnosis not present

## 2018-02-14 DIAGNOSIS — N2581 Secondary hyperparathyroidism of renal origin: Secondary | ICD-10-CM | POA: Diagnosis not present

## 2018-02-16 DIAGNOSIS — N186 End stage renal disease: Secondary | ICD-10-CM | POA: Diagnosis not present

## 2018-02-16 DIAGNOSIS — N2581 Secondary hyperparathyroidism of renal origin: Secondary | ICD-10-CM | POA: Diagnosis not present

## 2018-02-18 DIAGNOSIS — N2581 Secondary hyperparathyroidism of renal origin: Secondary | ICD-10-CM | POA: Diagnosis not present

## 2018-02-18 DIAGNOSIS — N186 End stage renal disease: Secondary | ICD-10-CM | POA: Diagnosis not present

## 2018-02-19 DIAGNOSIS — N186 End stage renal disease: Secondary | ICD-10-CM | POA: Diagnosis not present

## 2018-02-19 DIAGNOSIS — E1122 Type 2 diabetes mellitus with diabetic chronic kidney disease: Secondary | ICD-10-CM | POA: Diagnosis not present

## 2018-02-19 DIAGNOSIS — Z992 Dependence on renal dialysis: Secondary | ICD-10-CM | POA: Diagnosis not present

## 2018-02-21 DIAGNOSIS — Z992 Dependence on renal dialysis: Secondary | ICD-10-CM | POA: Diagnosis not present

## 2018-02-21 DIAGNOSIS — N186 End stage renal disease: Secondary | ICD-10-CM | POA: Diagnosis not present

## 2018-02-23 DIAGNOSIS — N186 End stage renal disease: Secondary | ICD-10-CM | POA: Diagnosis not present

## 2018-02-23 DIAGNOSIS — N2581 Secondary hyperparathyroidism of renal origin: Secondary | ICD-10-CM | POA: Diagnosis not present

## 2018-02-24 DIAGNOSIS — N186 End stage renal disease: Secondary | ICD-10-CM | POA: Diagnosis not present

## 2018-02-24 DIAGNOSIS — Z992 Dependence on renal dialysis: Secondary | ICD-10-CM | POA: Diagnosis not present

## 2018-02-24 DIAGNOSIS — I871 Compression of vein: Secondary | ICD-10-CM | POA: Diagnosis not present

## 2018-02-25 DIAGNOSIS — N186 End stage renal disease: Secondary | ICD-10-CM | POA: Diagnosis not present

## 2018-02-25 DIAGNOSIS — N2581 Secondary hyperparathyroidism of renal origin: Secondary | ICD-10-CM | POA: Diagnosis not present

## 2018-02-28 DIAGNOSIS — N186 End stage renal disease: Secondary | ICD-10-CM | POA: Diagnosis not present

## 2018-02-28 DIAGNOSIS — N2581 Secondary hyperparathyroidism of renal origin: Secondary | ICD-10-CM | POA: Diagnosis not present

## 2018-03-01 ENCOUNTER — Encounter: Payer: Self-pay | Admitting: Internal Medicine

## 2018-03-01 ENCOUNTER — Ambulatory Visit (INDEPENDENT_AMBULATORY_CARE_PROVIDER_SITE_OTHER)
Admission: RE | Admit: 2018-03-01 | Discharge: 2018-03-01 | Disposition: A | Discharge status: Home / Self Care | Payer: Medicare PPO | Source: Ambulatory Visit | Attending: Internal Medicine | Admitting: Internal Medicine

## 2018-03-01 ENCOUNTER — Ambulatory Visit (INDEPENDENT_AMBULATORY_CARE_PROVIDER_SITE_OTHER): Payer: Medicare PPO | Admitting: Internal Medicine

## 2018-03-01 VITALS — BP 124/74 | HR 97 | Ht 67.5 in | Wt 150.4 lb

## 2018-03-01 DIAGNOSIS — R918 Other nonspecific abnormal finding of lung field: Secondary | ICD-10-CM | POA: Diagnosis not present

## 2018-03-01 DIAGNOSIS — Z23 Encounter for immunization: Secondary | ICD-10-CM | POA: Diagnosis not present

## 2018-03-01 DIAGNOSIS — J9611 Chronic respiratory failure with hypoxia: Secondary | ICD-10-CM

## 2018-03-01 DIAGNOSIS — J449 Chronic obstructive pulmonary disease, unspecified: Secondary | ICD-10-CM | POA: Diagnosis not present

## 2018-03-01 NOTE — Patient Instructions (Addendum)
Goal for 02 is to keep sats > 90% while walking   Work on inhaler technique:  relax and gently blow all the way out then take a nice smooth deep breath back in, triggering the inhaler at same time you start breathing in.  Hold for up to 5 seconds if you can. Blow out thru nose. Rinse and gargle with water when done  Prednisone 10mg  / 5 mg if tolerated      Please schedule a follow up visit in 6 months but call sooner if needed

## 2018-03-01 NOTE — Progress Notes (Signed)
Subjective:   Patient ID: Terry [name], male    DOB: March 25, 1935    MRN: [id number]   Brief patient profile:  61 yowm quit smoking 2001 bothered by doe seemed better then worse in 2010 p "lung infection" and stayed on advair/spiriva  Combo since 2012 seemed better then admitted in [facility] with pna 10/09/12  in then transferred to [facility] x 3 weeks and discharged  On May 1st  2014 referred to pulmonary clinic 11/29/12  by [facility] for copd eval and proved to have GOLD III copd 01/26/13 with symptoms much better off ACEi.    History of Present Illness 08/27/2015  Extended post hosp /transition of care  f/u ov/Terry [name] re: COPD GOLD III recurrent admits/ now chronic resp failure/HD dep CRI plus GOLD III rx symb/spriiva/neb saba Chief Complaint  Patient presents with  . Follow-up    Pt c/o occasional cough with white mucus. Pt denies wheeze/increased SOB/CP/tightness. Pt is on O2/2L, he states that on 3L it dries out his nose and burns. Pt also c/o low BP readings and wonders if some of his HTN meds need to be changed.   now sleeping in a recliner since admit 30-45 degrees due to sob/some noct cough but excess/ purulent sputum or mucus plugs   On prednisone with floor of   @ 10 mg since d/c 08/19/15  rec May need to consider adding Oracit to your regimen and I will le Dr Lorrene Reid know You will need to adjust your 02 to saturations of over 90%  Best fit and humidified 02 ordered  Plan A = Automatic = Symbicort 160 2 in am and 2 in pm with spiriva each am and prednisone 10 mg with breakfast daily  Work on inhaler technique:  Plan B = Backup Only use your albuterol (proventil) as a rescue medication Plan C = Crisis - only use your albuterol nebulizer if you first try Plan B and it fails to help > ok to use the nebulizer up to every 4 hours but if start needing it regularly call for immediate appointment    01/07/2016  f/u ov/Terry [name] re:  Copd GOLD III/ rx 5 prednisone / symb/spiriva and rare saba   (maybe once a week)  02 hs only  Chief Complaint  Patient presents with  . Follow-up    pt states breathing is baseline since last OV. c/o sob & non prod cough.  02 1lpm hs rarely during the day / c/o dry mouth ? From spiriva dpi Doe = MMRC3 = can't walk 100 yards even at a slow pace at a flat grade s stopping due to sob   rec Plan A = Automatic = symbicort 160 Take 2 puffs first thing in am and then another 2 puffs about 12 hours later and spiriva 1.25 x 4 or 2.5 x 2puffs in am only  Plan B = Backup Only use your albuterol (ventolin) as a rescue medication  Plan C = Crisis - only use your albuterol nebulizer if you first try Plan B and it fails to help > ok to use the nebulizer up to every 4 hours but if start needing it regularly call for immediate appointment Prednisone 5 mg daily   Please schedule a follow up visit in 3 months but call sooner if needed     09/29/16 NP rec Begin Tudorza 1 puff Twice daily , brush rinse and gargle after use. > bothered mouth  Finish Tamiflu and Cipro .  Taper Prednisone  as directed to 10mg  1/2 daily .  Follow med calendar closely and bring to each visit.     07/20/2017  f/u ov/Terry [name] re:  Transition of care Copd/ 02 1-2lpm  - no med cal Chief Complaint  Patient presents with  . Follow-up    Was at [facility] 1/25-/127/19 for copd exacerbation. Given steroids and abx. Mildly feeling better.   Dyspnea: across the room on 02 Cough: dry/ does not keep him up as long as takes delsym Sleep: 30 degrees in recliner and 1lpm  Saba:   Daytime only  rec Plan A = Automatic = symbicort / tudorza  Plan B = Backup Only use your albuterol as a rescue medication  Plan C = Crisis - only use your albuterol nebulizer if you first try Plan B  Plan D = Deltasone (prednisone-  Double the dose that you are on until better then taper back to a floor of 10 mg daily  See Tammy NP w/in 2 weeks with all your medications did not do     11/18/2017  f/u ov/Terry [name] re:  Copd  gold IV not able to trigger tudorza  Chief Complaint  Patient presents with  . Follow-up    Doing better has had two stents placed in heart since last visit.   Dyspnea:  rollator MMRC3 = can't walk 100 yards even at a slow pace at a flat grade s stopping due to sob  On RA can do one aisle at [facility] then has to stop  Cough: worse since started lisinopril/  still some rattling / white mucus esp in am  Sleep: ok recliner > 30 degrees  No 02  SABA use:  Once a week  No increase need off of Tudorza  At prednisone 10 mg one half daily rec No change in respiratory medications but ok to wean  Prednisone to 5 mg a/w  2.5 daily x one week and if ok then 2.5 mg daily  - if nausea or worse breathing resume the previous dose and really worse ceiling is actually 20 mg per day  Leave off tudorza and  [employer]  Take 2 puffs first thing in am and then another 2 puffs about 12 hours later.  Stop lisinopril for now and let Dr Sanda Klein team handle it For cough > delsym 2 every 12 hours as needed     03/01/2018  f/u ov/Terry [name] re: copd GOLD IV/   Prednisone 10 mg daily / symbicort 160 2bid  Chief Complaint  Patient presents with  . Follow-up  Dyspnea:  No change = MMRC3 = can't walk 100 yards even at a slow pace at a flat grade s stopping due to sob  / no change before or after HD Cough: better  Sleeping: 30-45 degrees  SABA use: rarely needing while maint on symb  02:  Only at hs x one liter per min    No obvious day to day or daytime variability or assoc excess/ purulent sputum or mucus plugs or hemoptysis or cp or chest tightness, subjective wheeze or overt sinus or hb symptoms.   Sleeping as above without nocturnal  or early am exacerbation  of respiratory  c/o's or need for noct saba. Also denies any obvious fluctuation of symptoms with weather or environmental changes or other aggravating or alleviating factors except as outlined above   No unusual exposure hx or h/o childhood pna/ asthma  or knowledge of premature birth.  Current Allergies, Complete Past  Medical History, Past Surgical History, Family History, and Social History were reviewed in [employer] record.  ROS  The following are not active complaints unless bolded Hoarseness, sore throat, dysphagia, dental problems, itching, sneezing,  nasal congestion or discharge of excess mucus or purulent secretions, ear ache,   fever, chills, sweats, unintended wt loss or wt gain, classically pleuritic or exertional cp,  orthopnea pnd or arm/hand swelling  or leg swelling, presyncope, palpitations, abdominal pain, anorexia, nausea, vomiting, diarrhea  or change in bowel habits or change in bladder habits, change in stools or change in urine, dysuria, hematuria,  rash, arthralgias, visual complaints, headache, numbness, weakness or ataxia or problems with walking or coordination,  change in mood or  memory.        Current Meds  Medication Sig  . acetaminophen (TYLENOL) 500 MG tablet Take 1,000 mg by mouth every 6 (six) hours as needed for mild pain or headache.   . albuterol (PROVENTIL HFA;VENTOLIN HFA) 108 (90 Base) MCG/ACT inhaler Inhale 2 puffs into the lungs every 4 (four) hours as needed for wheezing or shortness of breath.  [name] albuterol (PROVENTIL) (2.5 MG/3ML) 0.083% nebulizer solution Take 3 mLs (2.5 mg total) by nebulization every 4 (four) hours as needed for wheezing or shortness of breath.  [name] aspirin EC 81 MG tablet Take 81 mg by mouth daily.  [name] atorvastatin (LIPITOR) 80 MG tablet Take 1 tablet (80 mg total) by mouth daily.  . budesonide-formoterol (SYMBICORT) 160-4.5 MCG/ACT inhaler Inhale 2 puffs into the lungs 2 (two) times daily.  . calcium acetate (PHOSLO) 667 MG capsule Take 667-1,334 mg by mouth See admin instructions. Take 1334 mg by mouth 3 times daily with each meal and take 667 mg by mouth with each snack  . cetirizine (ZYRTEC) 10 MG tablet Take 10 mg by mouth daily as needed for allergies.    [name] clopidogrel (PLAVIX) 75 MG tablet Take 1 tablet (75 mg total) by mouth daily.  [name] dextromethorphan-guaiFENesin (MUCINEX DM) 30-600 MG 12hr tablet Take 1 tablet by mouth 2 (two) times daily.   [name] ethyl chloride spray Apply 1 application topically every Monday, Wednesday, and Friday.   . famotidine (PEPCID) 20 MG tablet Take 1 tablet (20 mg total) by mouth 2 (two) times daily as needed for heartburn or indigestion.  . methocarbamol (ROBAXIN) 500 MG tablet Take 500 mg by mouth 2 (two) times daily as needed for muscle spasms.  . metoprolol succinate (TOPROL-XL) 50 MG 24 hr tablet Take 50 mg by mouth 2 (two) times daily. Take with or immediately following a meal.   . midodrine (PROAMATINE) 10 MG tablet Take 10 mg by mouth See admin instructions. Take 10 mg by mouth on Mon/Wed/Fri at dialysis as needed for low blood pressure  . nystatin (MYCOSTATIN) 100000 UNIT/ML suspension   . OXYGEN Inhale 1 L into the lungs daily as needed (for shortness of breath).   [name] oxymetazoline (AFRIN) 0.05 % nasal spray Place 1 spray into both nostrils once as needed (to stop nose bleeds).   . predniSONE (DELTASONE) 10 MG tablet Take 10 mg by mouth daily with breakfast.                     Objective:   Physical Exam  amb (will rollator) wm  nad    02/26/2015          210 >  08/27/2015 163 p HD > 09/26/2015  159 > 11/26/2015  160 > 01/07/2016  159 > 04/07/2016  159 > 05/06/2016>   07/21/2016  157 > 08/13/2016  152  > 12/10/2016  150 > 02/09/2017  152 > 03/09/2017  156 > 04/20/2017  152 > 05/20/2017  152 >    07/20/2017 156 > 03/01/2018  150   .Vital signs reviewed - Note on arrival 02 sats  95% on RA           HEENT: nl   oropharynx. Nl external ear canals without cough reflex -  Mild bilateral non-specific turbinate edema/edentulous     NECK :  without JVD/Nodes/TM/ nl carotid upstrokes bilaterally   LUNGS: no acc muscle use,  Mod barrel  contour chest wall with bilateral  Distant bs s audible wheeze and  without  cough on insp or exp maneuver and mod  Hyperresonant  to  percussion bilaterally     CV:  RRR  no s3 or murmur or increase in P2, and no edema/ trace ankle edema sym bilaterally    ABD:  soft and nontender with pos mid insp Hoover's  in the supine position. No bruits or organomegaly appreciated, bowel sounds nl  MS:   Walks with rollator/  ext warm without deformities, calf tenderness, cyanosis or clubbing No obvious joint restrictions   SKIN: warm and dry without lesions    NEURO:  alert, approp, nl sensorium with  no motor or cerebellar deficits apparent.                 CXR PA and Lateral:   03/01/2018 :    I personally reviewed images and agree with radiology impression as follows:   COPD. Bibasilar scarring. No acute infiltrate. The appearance of the chest has returned to that of January 2019.  No CHF.  Thoracic aortic atherosclerosis.

## 2018-03-02 DIAGNOSIS — N2581 Secondary hyperparathyroidism of renal origin: Secondary | ICD-10-CM | POA: Diagnosis not present

## 2018-03-02 DIAGNOSIS — N186 End stage renal disease: Secondary | ICD-10-CM | POA: Diagnosis not present

## 2018-03-02 NOTE — Progress Notes (Signed)
Spoke with pt and notified of results per Dr. Wert. Pt verbalized understanding and denied any questions. 

## 2018-03-04 DIAGNOSIS — N186 End stage renal disease: Secondary | ICD-10-CM | POA: Diagnosis not present

## 2018-03-04 DIAGNOSIS — J449 Chronic obstructive pulmonary disease, unspecified: Secondary | ICD-10-CM | POA: Diagnosis not present

## 2018-03-04 DIAGNOSIS — N2581 Secondary hyperparathyroidism of renal origin: Secondary | ICD-10-CM | POA: Diagnosis not present

## 2018-03-06 ENCOUNTER — Encounter: Payer: Self-pay | Admitting: Internal Medicine

## 2018-03-06 NOTE — Assessment & Plan Note (Signed)
New on 02/02/17 since 09/29/16 with transient L cp improved p zpak - 02/09/2017 rec esr, cbc with diff and cipro 750 bid x 10 days in view of h/o GNR pna's  - Quant Gold TB  02/11/17  Neg/  ESR  48  - cxr 03/09/2017 much better, no further abx indicated   - cxr 03/25/17 almost complete resolution > cxr 03/01/2018 acute changes resolved - no f/u needed

## 2018-03-06 NOTE — Assessment & Plan Note (Signed)
-   Trial off ACEi 11/30/2012 >>> much better symptom control - 05/28/2014   Walked RA x one lap @ 185 stopped due to  [employer] / sats ok/ mod pace - PFT's 01/26/13      FEV1  1.28 (48%) ratio 48 and no better p B2,  DLCO 39 corrects to 48%  - PFTs 02/26/2015   FEV1 0.99  (35 % ) ratio 35  p 11 % improvement from saba with DLCO  24 % corrects to 35  % for alv volume  p am symb/  - 11/26/2015 rec reduce pred to 5 mg daily  - 07/21/2016 try off spiriva due to throat irritation > restarted at rec of wife during cough but did not help it so d/c'd again 08/13/2016 with plan to change to spriva smi if breathing worse  - 02/09/2017 added spiriva  - 04/20/2017  After extensive coaching HFA effectiveness =    90%  - PFT's  11/18/2017  FEV1 0.68 (26 % ) ratio 39  p 4 % improvement from saba p symbicort and no lama prior to study with DLCO  21 % corrects to 36  % for alv volume   - 11/18/2017  Trial of Bevespi  Preferred symb - rec refer to rehab 03/01/2018   - The proper method of use, as well as anticipated side effects, of a metered-dose inhaler are discussed and demonstrated to the patient.    Severe copd / remains steroid dep. The goal with a chronic steroid dependent illness is always arriving at the lowest effective dose that controls the disease/symptoms and not accepting a set "formula" which is based on statistics or guidelines that don't always take into account patient  variability or the natural hx of the dz in every individual patient, which may well vary over time.  For now therefore I recommend the patient maintain  10mg /5 mg alternating days as a new floor

## 2018-03-06 NOTE — Assessment & Plan Note (Addendum)
02 rx as of  hosp discharge 08/14/15   As of 03/01/2018 =  1lpm HS and prn daytime to keep sats > 90%    I had an extended discussion with the patient reviewing all relevant studies completed to date and  lasting 15 to 20 minutes of a 25 minute visit    See device teaching which extended face to face time for this visit.  Each maintenance medication was reviewed in detail including emphasizing most importantly the difference between maintenance and prns and under what circumstances the prns are to be triggered using an action plan format that is not reflected in the computer generated alphabetically organized AVS which I have not found useful in most complex patients, especially with respiratory illnesses  Please see AVS for specific instructions unique to this visit that I personally wrote and verbalized to the the pt in detail and then reviewed with pt  by my nurse highlighting any  changes in therapy recommended at today's visit to their plan of care.

## 2018-03-07 DIAGNOSIS — N2581 Secondary hyperparathyroidism of renal origin: Secondary | ICD-10-CM | POA: Diagnosis not present

## 2018-03-07 DIAGNOSIS — N186 End stage renal disease: Secondary | ICD-10-CM | POA: Diagnosis not present

## 2018-03-08 ENCOUNTER — Ambulatory Visit (HOSPITAL_COMMUNITY): Payer: Medicare PPO | Attending: Cardiology

## 2018-03-08 ENCOUNTER — Other Ambulatory Visit: Payer: Self-pay

## 2018-03-08 DIAGNOSIS — N186 End stage renal disease: Secondary | ICD-10-CM | POA: Insufficient documentation

## 2018-03-08 DIAGNOSIS — I350 Nonrheumatic aortic (valve) stenosis: Secondary | ICD-10-CM | POA: Diagnosis not present

## 2018-03-08 DIAGNOSIS — I4891 Unspecified atrial fibrillation: Secondary | ICD-10-CM | POA: Insufficient documentation

## 2018-03-08 DIAGNOSIS — Z8673 Personal history of transient ischemic attack (TIA), and cerebral infarction without residual deficits: Secondary | ICD-10-CM | POA: Insufficient documentation

## 2018-03-08 DIAGNOSIS — J440 Chronic obstructive pulmonary disease with acute lower respiratory infection: Secondary | ICD-10-CM | POA: Diagnosis not present

## 2018-03-08 DIAGNOSIS — I2510 Atherosclerotic heart disease of native coronary artery without angina pectoris: Secondary | ICD-10-CM | POA: Insufficient documentation

## 2018-03-08 DIAGNOSIS — I132 Hypertensive heart and chronic kidney disease with heart failure and with stage 5 chronic kidney disease, or end stage renal disease: Secondary | ICD-10-CM | POA: Insufficient documentation

## 2018-03-09 DIAGNOSIS — N2581 Secondary hyperparathyroidism of renal origin: Secondary | ICD-10-CM | POA: Diagnosis not present

## 2018-03-09 DIAGNOSIS — N186 End stage renal disease: Secondary | ICD-10-CM | POA: Diagnosis not present

## 2018-03-10 ENCOUNTER — Telehealth: Payer: Self-pay | Admitting: Cardiovascular Disease

## 2018-03-10 NOTE — Telephone Encounter (Signed)
I spoke with pt's wife and reviewed echo results with her.  

## 2018-03-10 NOTE — Telephone Encounter (Signed)
New message:      Pt's wife is returning a call for echo results

## 2018-03-11 DIAGNOSIS — N2581 Secondary hyperparathyroidism of renal origin: Secondary | ICD-10-CM | POA: Diagnosis not present

## 2018-03-11 DIAGNOSIS — N186 End stage renal disease: Secondary | ICD-10-CM | POA: Diagnosis not present

## 2018-03-14 DIAGNOSIS — N186 End stage renal disease: Secondary | ICD-10-CM | POA: Diagnosis not present

## 2018-03-14 DIAGNOSIS — N2581 Secondary hyperparathyroidism of renal origin: Secondary | ICD-10-CM | POA: Diagnosis not present

## 2018-03-16 DIAGNOSIS — K922 Gastrointestinal hemorrhage, unspecified: Secondary | ICD-10-CM | POA: Diagnosis not present

## 2018-03-16 DIAGNOSIS — I2510 Atherosclerotic heart disease of native coronary artery without angina pectoris: Secondary | ICD-10-CM | POA: Diagnosis not present

## 2018-03-16 DIAGNOSIS — N186 End stage renal disease: Secondary | ICD-10-CM | POA: Diagnosis not present

## 2018-03-16 DIAGNOSIS — L0390 Cellulitis, unspecified: Secondary | ICD-10-CM | POA: Diagnosis not present

## 2018-03-16 DIAGNOSIS — Z992 Dependence on renal dialysis: Secondary | ICD-10-CM | POA: Diagnosis not present

## 2018-03-16 DIAGNOSIS — D500 Iron deficiency anemia secondary to blood loss (chronic): Secondary | ICD-10-CM | POA: Diagnosis not present

## 2018-03-16 DIAGNOSIS — M6281 Muscle weakness (generalized): Secondary | ICD-10-CM | POA: Diagnosis not present

## 2018-03-16 DIAGNOSIS — J449 Chronic obstructive pulmonary disease, unspecified: Secondary | ICD-10-CM | POA: Diagnosis not present

## 2018-03-16 DIAGNOSIS — N2581 Secondary hyperparathyroidism of renal origin: Secondary | ICD-10-CM | POA: Diagnosis not present

## 2018-03-16 DIAGNOSIS — I5031 Acute diastolic (congestive) heart failure: Secondary | ICD-10-CM | POA: Diagnosis not present

## 2018-03-16 DIAGNOSIS — R2681 Unsteadiness on feet: Secondary | ICD-10-CM | POA: Diagnosis not present

## 2018-03-18 DIAGNOSIS — N2581 Secondary hyperparathyroidism of renal origin: Secondary | ICD-10-CM | POA: Diagnosis not present

## 2018-03-18 DIAGNOSIS — N186 End stage renal disease: Secondary | ICD-10-CM | POA: Diagnosis not present

## 2018-03-21 DIAGNOSIS — N2581 Secondary hyperparathyroidism of renal origin: Secondary | ICD-10-CM | POA: Diagnosis not present

## 2018-03-21 DIAGNOSIS — E1122 Type 2 diabetes mellitus with diabetic chronic kidney disease: Secondary | ICD-10-CM | POA: Diagnosis not present

## 2018-03-21 DIAGNOSIS — N186 End stage renal disease: Secondary | ICD-10-CM | POA: Diagnosis not present

## 2018-03-21 DIAGNOSIS — Z992 Dependence on renal dialysis: Secondary | ICD-10-CM | POA: Diagnosis not present

## 2018-03-23 DIAGNOSIS — N186 End stage renal disease: Secondary | ICD-10-CM | POA: Diagnosis not present

## 2018-03-23 DIAGNOSIS — N2581 Secondary hyperparathyroidism of renal origin: Secondary | ICD-10-CM | POA: Diagnosis not present

## 2018-03-25 DIAGNOSIS — N2581 Secondary hyperparathyroidism of renal origin: Secondary | ICD-10-CM | POA: Diagnosis not present

## 2018-03-25 DIAGNOSIS — N186 End stage renal disease: Secondary | ICD-10-CM | POA: Diagnosis not present

## 2018-03-28 DIAGNOSIS — N186 End stage renal disease: Secondary | ICD-10-CM | POA: Diagnosis not present

## 2018-03-28 DIAGNOSIS — N2581 Secondary hyperparathyroidism of renal origin: Secondary | ICD-10-CM | POA: Diagnosis not present

## 2018-03-29 ENCOUNTER — Encounter: Payer: Self-pay | Admitting: Vascular Surgery

## 2018-03-29 ENCOUNTER — Other Ambulatory Visit: Payer: Self-pay

## 2018-03-29 ENCOUNTER — Ambulatory Visit (INDEPENDENT_AMBULATORY_CARE_PROVIDER_SITE_OTHER): Payer: Self-pay | Admitting: Vascular Surgery

## 2018-03-29 ENCOUNTER — Ambulatory Visit (HOSPITAL_COMMUNITY)
Admission: RE | Admit: 2018-03-29 | Discharge: 2018-03-29 | Disposition: A | Discharge status: Home / Self Care | Payer: Medicare PPO | Source: Ambulatory Visit | Attending: Family | Admitting: Family

## 2018-03-29 VITALS — BP 144/72 | HR 93 | Temp 97.3°F | Resp 22 | Ht 67.5 in | Wt 156.0 lb

## 2018-03-29 DIAGNOSIS — I739 Peripheral vascular disease, unspecified: Secondary | ICD-10-CM

## 2018-03-29 NOTE — Progress Notes (Signed)
Patient name: Terry [name]. MRN: [id number] DOB: 1935-06-09 Sex: male  REASON FOR VISIT: Follow-up left femoral endarterectomy and Dacron patch angioplasty for critical limb ischemia  HPI: Terry [name]. is a 83 y.o. male here today for follow-up.  He underwent left common and profundus endarterectomy with Dacron patch angioplasty.  He had critical limb ischemia with rest pain and excoriation over his entire left foot.  This has completely resolved and he reports no further discomfort.  Current Outpatient Medications  Medication Sig Dispense Refill  . acetaminophen (TYLENOL) 500 MG tablet Take 1,000 mg by mouth every 6 (six) hours as needed for mild pain or headache.     . albuterol (PROVENTIL HFA;VENTOLIN HFA) 108 (90 Base) MCG/ACT inhaler Inhale 2 puffs into the lungs every 4 (four) hours as needed for wheezing or shortness of breath. 1 Inhaler 0  . albuterol (PROVENTIL) (2.5 MG/3ML) 0.083% nebulizer solution Take 3 mLs (2.5 mg total) by nebulization every 4 (four) hours as needed for wheezing or shortness of breath. 75 mL 0  . aspirin EC 81 MG tablet Take 81 mg by mouth daily.    [name] atorvastatin (LIPITOR) 80 MG tablet Take 1 tablet (80 mg total) by mouth daily. 90 tablet 3  . budesonide-formoterol (SYMBICORT) 160-4.5 MCG/ACT inhaler Inhale 2 puffs into the lungs 2 (two) times daily.    . calcium acetate (PHOSLO) 667 MG capsule Take 667-1,334 mg by mouth See admin instructions. Take 1334 mg by mouth 3 times daily with each meal and take 667 mg by mouth with each snack    . cetirizine (ZYRTEC) 10 MG tablet Take 10 mg by mouth daily as needed for allergies.     [name] clopidogrel (PLAVIX) 75 MG tablet Take 1 tablet (75 mg total) by mouth daily. 90 tablet 3  . dextromethorphan-guaiFENesin (MUCINEX DM) 30-600 MG 12hr tablet Take 1 tablet by mouth 2 (two) times daily.     [name] ethyl chloride spray Apply 1 application topically every Monday, Wednesday, and Friday.       . famotidine (PEPCID) 20 MG tablet Take 1 tablet (20 mg total) by mouth 2 (two) times daily as needed for heartburn or indigestion. 30 tablet 1  . methocarbamol (ROBAXIN) 500 MG tablet Take 500 mg by mouth 2 (two) times daily as needed for muscle spasms.    . metoprolol succinate (TOPROL-XL) 50 MG 24 hr tablet Take 50 mg by mouth 2 (two) times daily. Take with or immediately following a meal.     . midodrine (PROAMATINE) 10 MG tablet Take 10 mg by mouth See admin instructions. Take 10 mg by mouth on Mon/Wed/Fri at dialysis as needed for low blood pressure    . nystatin (MYCOSTATIN) 100000 UNIT/ML suspension     . OXYGEN Inhale 1 L into the lungs daily as needed (for shortness of breath).     [name] oxymetazoline (AFRIN) 0.05 % nasal spray Place 1 spray into both nostrils once as needed (to stop nose bleeds).     . predniSONE (DELTASONE) 10 MG tablet Take 10 mg by mouth daily with breakfast.     No current facility-administered medications for this visit.      PHYSICAL EXAM: Vitals:   03/29/18 1354  BP: (!) 144/72  Pulse: 93  Resp: (!) 22  Temp: (!) 97.3 F (36.3 C)  TempSrc: Oral  SpO2: 94%  Weight: 156 lb (70.8 kg)  Height: 5' 7.5" (1.715 m)    GENERAL: The patient is a well-nourished  male, in no acute distress. The vital signs are documented above. Left groin incision completely healed with the 2-3+ femoral pulse. Left foot healing with no open ulceration.  MEDICAL ISSUES: Stable status post femoral endarterectomy and patch angioplasty.  Will continue to monitor both feet.  Will notify should he develop any difficulty.  Otherwise will see [place] again on an as-needed basis   Rosetta Posner, MD [facility] Tel [phone] Pager [phone]

## 2018-03-30 DIAGNOSIS — N186 End stage renal disease: Secondary | ICD-10-CM | POA: Diagnosis not present

## 2018-03-30 DIAGNOSIS — N2581 Secondary hyperparathyroidism of renal origin: Secondary | ICD-10-CM | POA: Diagnosis not present

## 2018-03-31 ENCOUNTER — Encounter: Payer: Self-pay | Admitting: Cardiovascular Disease

## 2018-03-31 ENCOUNTER — Ambulatory Visit (INDEPENDENT_AMBULATORY_CARE_PROVIDER_SITE_OTHER): Payer: Medicare PPO | Admitting: Cardiovascular Disease

## 2018-03-31 VITALS — BP 132/60 | HR 87 | Ht 67.5 in | Wt 156.4 lb

## 2018-03-31 DIAGNOSIS — I10 Essential (primary) hypertension: Secondary | ICD-10-CM

## 2018-03-31 DIAGNOSIS — E7800 Pure hypercholesterolemia, unspecified: Secondary | ICD-10-CM | POA: Diagnosis not present

## 2018-03-31 DIAGNOSIS — I2510 Atherosclerotic heart disease of native coronary artery without angina pectoris: Secondary | ICD-10-CM | POA: Diagnosis not present

## 2018-03-31 DIAGNOSIS — I350 Nonrheumatic aortic (valve) stenosis: Secondary | ICD-10-CM

## 2018-03-31 DIAGNOSIS — I480 Paroxysmal atrial fibrillation: Secondary | ICD-10-CM | POA: Diagnosis not present

## 2018-03-31 MED ORDER — METOPROLOL SUCCINATE ER 50 MG PO TB24: 50.0000 mg | ORAL_TABLET | Freq: Two times a day (BID) | ORAL | 3 refills | Status: DC

## 2018-03-31 MED ORDER — ATORVASTATIN CALCIUM 80 MG PO TABS: 80.0000 mg | ORAL_TABLET | Freq: Every day | ORAL | 3 refills | Status: DC

## 2018-03-31 NOTE — Progress Notes (Signed)
Chief Complaint  Patient presents with  . Follow-up    CAD, aortic stenosis   History of Present Illness: 83 yo male with history of paroxysmal atrial fibrillation, HTN, HLD, prior CVA, COPD, CAD, ESRD on HD and moderate aortic stenosis here today for cardiac follow up. He had been followed by cardiology in [place], [name] for many years until he moved to [place] in 2014. Cath records received from [facility] . Cardiac cath 11/30/1997. Mild distal Left main stenosis, 25% proximal LAD, minor irregularities Circumflex, 95% mid RCA stenosis treated with 3.5 x 32 mm bare metal stent. There was a ? Overlapping 3.5 x 18 mm bare metal stent more proximally. Echo 02/03/13 with LVEF=50-55%. He had normal carotid dopplers in [place] 2013. He was admitted to [facility] October 2015 with ruptured kidney cyst. Atrial fibrillation diagnosed in December 2015 at an office visit. He was initially started on Eliquis but progressed to ESRD so this was stopped. He was started on coumadin but had GI blood loss and severe bruising so this was stopped. He has been on low dose beta blocker therapy. He had been started on amiodarone but this was stopped. Low risk stress myoview September 2016.  He was admitted to [facility] in March 2019 with SOB. He was in atrial fib and treated for pneumonia and COPD exacerbation. Cardiac cath with severe RCA disease (70% ostial stenosis, 95% mid stenosis, 50% LAD proximal stenosis, Circumflex 40% mid stenosis. His vessels were all calcified. Echo March 2019 with LVEF=55%, severe AS with AVA 0.7 cm2, mean gradient 21 mmHg, mild LVH. He was transferred to [facility] and had rotational atherectomy of the ostial and mid RCA and placement of a 3.0 x 28 mm drug eluting stent in the mid vessel and a 3.5 x 24 mm drug eluting stent in the ostium of the RCA. Echo at [facility] with aortic valve area of 1.0 cm2 He was seen by the TAVR team and it was felt that TAVR was not indicated at this time as  his AS was only moderate. Mean gradient 25 mmHg. Echo here in September 2019 with normal LV systolic function, moderately severe AS with mean gradient 20 mmHg, AVA around 1.0 cm2 and DVI 0.21. He has PAD and left leg ischemia in July 2019 treated with left femoral endarterectomy, Dr. Donnetta Hutching.   He is here today for follow up. The patient denies any chest pain, palpitations, lower extremity edema, orthopnea, PND, dizziness, near syncope or syncope. He continues to have dyspnea and wears supplemental O2 for his COPD. NO issues on HD.    Primary Care Physician: Jamal Maes, MD  Past Medical History:  Diagnosis Date  . Acute and chronic respiratory failure 10/22/2012  . Anxiety   . Arthritis    "hands, knees" (03/15/2017)  . Atrial fibrillation ([place])   . CAD (coronary artery disease) [place], [name]  . CHF (congestive heart failure) ([place]) 09/2012  . COPD (chronic obstructive pulmonary disease) ([place])   . ESRD (end stage renal disease) on dialysis [facility])    "MWF; [facility] [place]" (03/15/2017)  . Flu 09/24/2016   influenza b  . HCAP (healthcare-associated pneumonia) 03/15/2017  . High cholesterol   . History of blood transfusion 2012   "related to bowel resection"  . Hypertension   . On home oxygen therapy    "1L typically at night" (03/15/2017)  . Osteoarthritis 10/22/2012  . Pneumonia April 2014; 09/2016  . Shortness of breath dyspnea   .  Stroke [facility]) 2012   denies residual on 03/15/2017  . Stroke [facility]) ~ 2013   "small one in his right eye"    Past Surgical History:  Procedure Laterality Date  . ABDOMINAL AORTOGRAM N/A 12/16/2017   Procedure: ABDOMINAL AORTOGRAM;  Surgeon: Conrad [place], MD;  Location: [facility];  Service: Cardiovascular;  Laterality: N/A;  . APPENDECTOMY  1984  . AV FISTULA PLACEMENT Right 05/21/2015   Procedure: Right Arm Brachiocephalic ARTERIOVENOUS (AV) FISTULA CREATION;  Surgeon: Angelia Mould, MD;  Location: [facility];  Service:  Vascular;  Laterality: Right;  . CATARACT EXTRACTION W/ INTRAOCULAR LENS  IMPLANT, BILATERAL Bilateral   . COLECTOMY  2012   partial colon removed - twisted bowel  . CORONARY ANGIOPLASTY WITH STENT PLACEMENT  1999   2 stents  . CYSTOSCOPY/RETROGRADE/URETEROSCOPY Bilateral 04/23/2014   Procedure: CYSTOSCOPY BILATERAL RETROGRADE,  LEFT URETEROSCOPY, LEFT STENT PLACEMENT;  Surgeon: Raynelle Bring, MD;  Location: [facility];  Service: Urology;  Laterality: Bilateral;  . ENDARTERECTOMY FEMORAL Left 12/29/2017   Procedure: Left Femoral ENDARTERECTOMY with Patch Angioplasty using Xenosure patch;  Surgeon: Rosetta Posner, MD;  Location: [place];  Service: Vascular;  Laterality: Left;  . EYE SURGERY Left    "surgery to clean off lens after cataract OR"  . INGUINAL HERNIA REPAIR Right   . LOWER EXTREMITY ANGIOGRAPHY Bilateral 12/16/2017   Procedure: LOWER EXTREMITY ANGIOGRAPHY;  Surgeon: Conrad [place], MD;  Location: [facility];  Service: Cardiovascular;  Laterality: Bilateral;    Current Outpatient Medications  Medication Sig Dispense Refill  . acetaminophen (TYLENOL) 500 MG tablet Take 1,000 mg by mouth every 6 (six) hours as needed for mild pain or headache.     . albuterol (PROVENTIL HFA;VENTOLIN HFA) 108 (90 Base) MCG/ACT inhaler Inhale 2 puffs into the lungs every 4 (four) hours as needed for wheezing or shortness of breath. 1 Inhaler 0  . albuterol (PROVENTIL) (2.5 MG/3ML) 0.083% nebulizer solution Take 3 mLs (2.5 mg total) by nebulization every 4 (four) hours as needed for wheezing or shortness of breath. 75 mL 0  . aspirin EC 81 MG tablet Take 81 mg by mouth daily.    [name] atorvastatin (LIPITOR) 80 MG tablet Take 1 tablet (80 mg total) by mouth daily. 90 tablet 3  . budesonide-formoterol (SYMBICORT) 160-4.5 MCG/ACT inhaler Inhale 2 puffs into the lungs 2 (two) times daily.    . calcium acetate (PHOSLO) 667 MG capsule Take 667-1,334 mg by mouth See admin instructions. Take 1334 mg by mouth 3 times  daily with each meal and take 667 mg by mouth with each snack    . cetirizine (ZYRTEC) 10 MG tablet Take 10 mg by mouth daily as needed for allergies.     [name] clopidogrel (PLAVIX) 75 MG tablet Take 1 tablet (75 mg total) by mouth daily. 90 tablet 3  . dextromethorphan-guaiFENesin (MUCINEX DM) 30-600 MG 12hr tablet Take 1 tablet by mouth 2 (two) times daily.     [name] ethyl chloride spray Apply 1 application topically every Monday, Wednesday, and Friday.     . famotidine (PEPCID) 20 MG tablet Take 1 tablet (20 mg total) by mouth 2 (two) times daily as needed for heartburn or indigestion. 30 tablet 1  . methocarbamol (ROBAXIN) 500 MG tablet Take 500 mg by mouth 2 (two) times daily as needed for muscle spasms.    . metoprolol succinate (TOPROL-XL) 50 MG 24 hr tablet Take 1 tablet (50 mg total) by mouth 2 (two)  times daily. Take with or immediately following a meal. 180 tablet 3  . midodrine (PROAMATINE) 10 MG tablet Take 10 mg by mouth See admin instructions. Take 10 mg by mouth on Mon/Wed/Fri at dialysis as needed for low blood pressure    . nystatin (MYCOSTATIN) 100000 UNIT/ML suspension     . OXYGEN Inhale 1 L into the lungs daily as needed (for shortness of breath).     [name] oxymetazoline (AFRIN) 0.05 % nasal spray Place 1 spray into both nostrils once as needed (to stop nose bleeds).     . predniSONE (DELTASONE) 10 MG tablet Take 10 mg by mouth daily with breakfast.     No current facility-administered medications for this visit.     Allergies  Allergen Reactions  . Yellow Dyes (Non-Tartrazine) Other (See Comments)    Burns arms; Cough medications (tussionex and benzonatate ok) specific - yellow dye used for ophthalmology procedure   . Levaquin [Levofloxacin In D5w] Itching  . Levofloxacin Nausea And Vomiting    Social History   Socioeconomic History  . Marital status: Married    Spouse name: Not on file  . Number of children: 3  . Years of education: Not on file  . Highest education level:  Not on file  Occupational History  . Occupation: retired-worked in Writer: RETIRED  Social Needs  . Financial resource strain: Not on file  . Food insecurity:    Worry: Not on file    Inability: Not on file  . Transportation needs:    Medical: Not on file    Non-medical: Not on file  Tobacco Use  . Smoking status: Former Smoker    Packs/day: 1.00    Years: 49.00    Pack years: 49.00    Types: Cigarettes, Pipe, Cigars    Last attempt to quit: 02/21/2000    Years since quitting: 18.1  . Smokeless tobacco: Former Systems developer    Types: Chew  . Tobacco comment: "chewed when he was a kid"  Substance and Sexual Activity  . Alcohol use: Yes    Comment: 03/15/2017 "nothing since the 1960's"  . Drug use: No  . Sexual activity: Never  Lifestyle  . Physical activity:    Days per week: Not on file    Minutes per session: Not on file  . Stress: Not on file  Relationships  . Social connections:    Talks on phone: Not on file    Gets together: Not on file    Attends religious service: Not on file    Active member of club or organization: Not on file    Attends meetings of clubs or organizations: Not on file    Relationship status: Not on file  . Intimate partner violence:    Fear of current or ex partner: Not on file    Emotionally abused: Not on file    Physically abused: Not on file    Forced sexual activity: Not on file  Other Topics Concern  . Not on file  Social History Narrative   Work or School: retired [employer] Situation: lives with wife in [place] Beliefs: [place] regular; poor diet          Family History  Problem Relation Age of Onset  . Cancer Mother        Breast  . Heart disease Father   . Heart  attack Father   . Parkinson's disease Brother     Review of Systems:  As stated in the HPI and otherwise negative.   BP 132/60   Pulse 87   Ht 5' 7.5" (1.715 m)   Wt 156 lb 6.4 oz (70.9 kg)   SpO2  91%   BMI 24.13 kg/m   Physical Examination:  General: Well developed, well nourished, NAD  HEENT: OP clear, mucus membranes moist  SKIN: warm, dry. No rashes. Neuro: No focal deficits  Musculoskeletal: Muscle strength 5/5 all ext  Psychiatric: Mood and affect normal  Neck: No JVD, no carotid bruits, no thyromegaly, no lymphadenopathy.  Lungs:Clear bilaterally, no wheezes, rhonci, crackles Cardiovascular: Regular rate and rhythm. No murmurs, gallops or rubs. Abdomen:Soft. Bowel sounds present. Non-tender.  Extremities: No lower extremity edema. Pulses are 2 + in the bilateral DP/PT.  Echo September 2019: - Left ventricle: The cavity size was normal. Wall thickness was   increased in a pattern of mild LVH. Systolic function was normal.   The estimated ejection fraction was in the range of 55% to 60%.   Wall motion was normal; there were no regional wall motion   abnormalities. The study was not technically sufficient to allow   evaluation of LV diastolic dysfunction due to atrial   fibrillation. - Aortic valve: Trileaflet; severely calcified leaflets. There was   moderate stenosis. There was trivial regurgitation. Mean gradient   (S): 20 mm Hg. Valve area (VTI): 1.14 cm^2. - Mitral valve: Mildly to moderately calcified annulus. Mildly   calcified leaflets . There was trivial regurgitation. - Left atrium: The atrium was moderately dilated. - Right ventricle: The cavity size was mildly dilated. Systolic   function was mildly reduced. - Right atrium: The atrium was mildly to moderately dilated. - Pulmonary arteries: No complete TR doppler jet so unable to   estimate PA systolic pressure. - Systemic veins: IVC measured 2.4 cm with normal respirophasic   variation, suggesting RA pressure 8 mmHg.  Impressions:  - The patient was in atrial fibrillation. Normal LV size with mild   LV hypertrophy. EF 55-60%. Mildly dilated RV with mildly   decreased systolic function. Moderate  aortic stenosis.  ------------------------------------------------------------------- Study data:   Study status:  Routine.  Procedure:  The patient reported no pain pre or post test. Transthoracic echocardiography for left ventricular function evaluation, for right ventricular function evaluation, and for assessment of valvular function. Image quality was adequate.  Study completion:  There were no complications.          Transthoracic echocardiography.  M-mode, complete 2D, spectral Doppler, and color Doppler.  Birthdate: Patient birthdate: 1935-03-29.  Age:  Patient is 83 yr old.  Sex: Gender: male.    BMI: 23.2 kg/m^2.  Blood pressure:     124/69 Patient status:  Outpatient.  Study date:  Study date: 03/08/2018. Study time: 02:14 PM.  Location:  [place] [facility]  -------------------------------------------------------------------  ------------------------------------------------------------------- Left ventricle:  The cavity size was normal. Wall thickness was increased in a pattern of mild LVH. Systolic function was normal. The estimated ejection fraction was in the range of 55% to 60%. Wall motion was normal; there were no regional wall motion abnormalities. The study was not technically sufficient to allow evaluation of LV diastolic dysfunction due to atrial fibrillation.   ------------------------------------------------------------------- Aortic valve:   Trileaflet; severely calcified leaflets.  Doppler:  There was moderate stenosis.   There was trivial regurgitation. VTI ratio of LVOT to aortic valve: 0.25. Valve  area (VTI): 1.14 cm^2. Indexed valve area (VTI): 0.63 cm^2/m^2. Peak velocity ratio of LVOT to aortic valve: 0.21. Valve area (Vmax): 0.96 cm^2. Indexed valve area (Vmax): 0.53 cm^2/m^2. Mean velocity ratio of LVOT to aortic valve: 0.22. Valve area (Vmean): 1.01 cm^2. Indexed valve area (Vmean): 0.56 cm^2/m^2.    Mean gradient (S): 20 mm Hg. Peak gradient (S):  38 mm Hg.  ------------------------------------------------------------------- Aorta:  Aortic root: The aortic root was normal in size. Ascending aorta: The ascending aorta was normal in size.  ------------------------------------------------------------------- Mitral valve:   Mildly to moderately calcified annulus. Mildly calcified leaflets .  Doppler:   There was no evidence for stenosis.   There was trivial regurgitation.    Peak gradient (D): 7 mm Hg.  ------------------------------------------------------------------- Left atrium:  The atrium was moderately dilated.  ------------------------------------------------------------------- Right ventricle:  The cavity size was mildly dilated. Systolic function was mildly reduced.  ------------------------------------------------------------------- Pulmonic valve:    Structurally normal valve.   Cusp separation was normal.  Doppler:  Transvalvular velocity was within the normal range. There was no regurgitation.  ------------------------------------------------------------------- Tricuspid valve:   Doppler:  There was no significant regurgitation.  ------------------------------------------------------------------- Pulmonary artery:   No complete TR doppler jet so unable to estimate PA systolic pressure.  ------------------------------------------------------------------- Right atrium:  The atrium was mildly to moderately dilated.  ------------------------------------------------------------------- Pericardium:  There was no pericardial effusion.  EKG:  EKG is not ordered today. The ekg ordered today demonstrates   Recent Labs: 11/29/2017: B Natriuretic Peptide 565.3 12/28/2017: ALT 22 12/30/2017: BUN 77; Creatinine, Ser 7.66; Hemoglobin 9.1; Platelets 222; Potassium 4.7; Sodium 137   Lipid Panel    Component Value Date/Time   CHOL 183 03/20/2015 1001   TRIG 91 06/08/2015 0016   HDL 34.40 (L) 03/20/2015 1001    CHOLHDL 5 03/20/2015 1001   VLDL 38.8 03/20/2015 1001   LDLCALC 109 (H) 03/20/2015 1001   LDLDIRECT 93.9 02/03/2013 0933     Wt Readings from Last 3 Encounters:  03/31/18 156 lb 6.4 oz (70.9 kg)  03/29/18 156 lb (70.8 kg)  03/01/18 150 lb 6.4 oz (68.2 kg)     Other studies Reviewed: Additional studies/ records that were reviewed today include: . Review of the above records demonstrates:    Assessment and Plan:   1. CAD without angina. No chest pain. Recent PCI/stenting of the RCA March 2019 at [facility]. Continue ASA, Plavix, beta blocker and statin.      2. HTN: BP is controlled. No changes  3. Hyperlipidemia: Will check lipids today. Continue staitn.   4. Atrial fib, paroxysmal: He is in atrial fibrillation today.Rate is controlled. He did not tolerate coumadin due to GI bleeding. We have not considered a NOAC given his ESRD. Continue ASA and Toprol.    5. Aortic stenosis: He has moderately severe AS by echo September 2019. Mean gradient is 20 mmHg. DVI 0.21 with AVA around 1.0 cm2.  His valve leaflets are thickened and calcified. He is nearing the point of needing consideration for TAVR. He is not a good candidate for most procedures given severe COPD, ESRD on HD and severe PAD but if his AS progresses, we could at least consider workup for TAVR.   6. PAD: He is followed by Dr. Donnetta Hutching.    Current medicines are reviewed at length with the patient today.  The patient does not have concerns regarding medicines.  The following changes have been made:  no change  Labs/ tests ordered today include:   Orders Placed  This Encounter  Procedures  . Lipid Profile    Disposition:   FU with me in 6 months  Signed, Lauree Chandler, MD 03/31/2018 4:06 PM    [facility] [address], [place], [place]  [address] Phone: [phone]; Fax: [phone]

## 2018-03-31 NOTE — Patient Instructions (Signed)
Medication Instructions:  Your physician recommends that you continue on your current medications as directed. Please refer to the Current Medication list given to you today.  If you need a refill on your cardiac medications before your next appointment, please call your pharmacy.   Lab work: Lab work to be done today--Lipid profile If you have labs (blood work) drawn today and your tests are completely normal, you will receive your results only by: [name] MyChart Message (if you have MyChart) OR . A paper copy in the mail If you have any lab test that is abnormal or we need to change your treatment, we will call you to review the results.  Testing/Procedures: none  Follow-Up: At [facility], you and your health needs are our priority.  As part of our continuing mission to provide you with exceptional heart care, we have created designated Provider Care Teams.  These Care Teams include your primary Cardiologist (physician) and Advanced Practice Providers (APPs -  Physician Assistants and Nurse Practitioners) who all work together to provide you with the care you need, when you need it. You will need a follow up appointment in 6 months.  Please call our office 2 months in advance to schedule this appointment.  You may see Lauree Chandler, MD or one of the following Advanced Practice Providers on your designated Care Team:   [place], PA-C Melina Copa, PA-C . Ermalinda Barrios, PA-C  Any Other Special Instructions Will Be Listed Below (If Applicable).

## 2018-04-01 DIAGNOSIS — N2581 Secondary hyperparathyroidism of renal origin: Secondary | ICD-10-CM | POA: Diagnosis not present

## 2018-04-01 DIAGNOSIS — N186 End stage renal disease: Secondary | ICD-10-CM | POA: Diagnosis not present

## 2018-04-01 LAB — LIPID PANEL
CHOLESTEROL TOTAL: 121 mg/dL (ref 100–199)
Chol/HDL Ratio: 2.8 ratio (ref 0.0–5.0)
HDL: 44 mg/dL (ref >39)
LDL Calculated: 57 mg/dL (ref 0–99)
Triglycerides: 100 mg/dL (ref 0–149)
VLDL Cholesterol Cal: 20 mg/dL (ref 5–40)

## 2018-04-03 DIAGNOSIS — J449 Chronic obstructive pulmonary disease, unspecified: Secondary | ICD-10-CM | POA: Diagnosis not present

## 2018-04-04 DIAGNOSIS — N2581 Secondary hyperparathyroidism of renal origin: Secondary | ICD-10-CM | POA: Diagnosis not present

## 2018-04-04 DIAGNOSIS — N186 End stage renal disease: Secondary | ICD-10-CM | POA: Diagnosis not present

## 2018-04-06 DIAGNOSIS — N2581 Secondary hyperparathyroidism of renal origin: Secondary | ICD-10-CM | POA: Diagnosis not present

## 2018-04-06 DIAGNOSIS — N186 End stage renal disease: Secondary | ICD-10-CM | POA: Diagnosis not present

## 2018-04-08 DIAGNOSIS — N2581 Secondary hyperparathyroidism of renal origin: Secondary | ICD-10-CM | POA: Diagnosis not present

## 2018-04-08 DIAGNOSIS — N186 End stage renal disease: Secondary | ICD-10-CM | POA: Diagnosis not present

## 2018-04-11 DIAGNOSIS — N186 End stage renal disease: Secondary | ICD-10-CM | POA: Diagnosis not present

## 2018-04-11 DIAGNOSIS — N2581 Secondary hyperparathyroidism of renal origin: Secondary | ICD-10-CM | POA: Diagnosis not present

## 2018-04-12 ENCOUNTER — Ambulatory Visit: Payer: Medicare PPO | Admitting: Podiatry

## 2018-04-12 DIAGNOSIS — H01021 Squamous blepharitis right upper eyelid: Secondary | ICD-10-CM | POA: Diagnosis not present

## 2018-04-12 DIAGNOSIS — H04123 Dry eye syndrome of bilateral lacrimal glands: Secondary | ICD-10-CM | POA: Diagnosis not present

## 2018-04-12 DIAGNOSIS — H1859 Other hereditary corneal dystrophies: Secondary | ICD-10-CM | POA: Diagnosis not present

## 2018-04-12 DIAGNOSIS — H35033 Hypertensive retinopathy, bilateral: Secondary | ICD-10-CM | POA: Diagnosis not present

## 2018-04-12 DIAGNOSIS — H01024 Squamous blepharitis left upper eyelid: Secondary | ICD-10-CM | POA: Diagnosis not present

## 2018-04-12 DIAGNOSIS — H26491 Other secondary cataract, right eye: Secondary | ICD-10-CM | POA: Diagnosis not present

## 2018-04-12 DIAGNOSIS — H01025 Squamous blepharitis left lower eyelid: Secondary | ICD-10-CM | POA: Diagnosis not present

## 2018-04-12 DIAGNOSIS — Z961 Presence of intraocular lens: Secondary | ICD-10-CM | POA: Diagnosis not present

## 2018-04-12 DIAGNOSIS — H01022 Squamous blepharitis right lower eyelid: Secondary | ICD-10-CM | POA: Diagnosis not present

## 2018-04-13 DIAGNOSIS — N186 End stage renal disease: Secondary | ICD-10-CM | POA: Diagnosis not present

## 2018-04-13 DIAGNOSIS — N2581 Secondary hyperparathyroidism of renal origin: Secondary | ICD-10-CM | POA: Diagnosis not present

## 2018-04-15 DIAGNOSIS — J449 Chronic obstructive pulmonary disease, unspecified: Secondary | ICD-10-CM | POA: Diagnosis not present

## 2018-04-15 DIAGNOSIS — R2681 Unsteadiness on feet: Secondary | ICD-10-CM | POA: Diagnosis not present

## 2018-04-15 DIAGNOSIS — N186 End stage renal disease: Secondary | ICD-10-CM | POA: Diagnosis not present

## 2018-04-15 DIAGNOSIS — K922 Gastrointestinal hemorrhage, unspecified: Secondary | ICD-10-CM | POA: Diagnosis not present

## 2018-04-15 DIAGNOSIS — I5031 Acute diastolic (congestive) heart failure: Secondary | ICD-10-CM | POA: Diagnosis not present

## 2018-04-15 DIAGNOSIS — M6281 Muscle weakness (generalized): Secondary | ICD-10-CM | POA: Diagnosis not present

## 2018-04-15 DIAGNOSIS — Z992 Dependence on renal dialysis: Secondary | ICD-10-CM | POA: Diagnosis not present

## 2018-04-15 DIAGNOSIS — L0390 Cellulitis, unspecified: Secondary | ICD-10-CM | POA: Diagnosis not present

## 2018-04-15 DIAGNOSIS — D500 Iron deficiency anemia secondary to blood loss (chronic): Secondary | ICD-10-CM | POA: Diagnosis not present

## 2018-04-15 DIAGNOSIS — I2510 Atherosclerotic heart disease of native coronary artery without angina pectoris: Secondary | ICD-10-CM | POA: Diagnosis not present

## 2018-04-15 DIAGNOSIS — N2581 Secondary hyperparathyroidism of renal origin: Secondary | ICD-10-CM | POA: Diagnosis not present

## 2018-04-18 DIAGNOSIS — N186 End stage renal disease: Secondary | ICD-10-CM | POA: Diagnosis not present

## 2018-04-18 DIAGNOSIS — Z1159 Encounter for screening for other viral diseases: Secondary | ICD-10-CM | POA: Diagnosis not present

## 2018-04-18 DIAGNOSIS — Z992 Dependence on renal dialysis: Secondary | ICD-10-CM | POA: Diagnosis not present

## 2018-04-19 DIAGNOSIS — Z992 Dependence on renal dialysis: Secondary | ICD-10-CM | POA: Diagnosis not present

## 2018-04-19 DIAGNOSIS — Z7982 Long term (current) use of aspirin: Secondary | ICD-10-CM | POA: Diagnosis not present

## 2018-04-19 DIAGNOSIS — J449 Chronic obstructive pulmonary disease, unspecified: Secondary | ICD-10-CM | POA: Diagnosis not present

## 2018-04-19 DIAGNOSIS — I2510 Atherosclerotic heart disease of native coronary artery without angina pectoris: Secondary | ICD-10-CM | POA: Diagnosis not present

## 2018-04-19 DIAGNOSIS — T82838A Hemorrhage of vascular prosthetic devices, implants and grafts, initial encounter: Secondary | ICD-10-CM | POA: Diagnosis not present

## 2018-04-19 DIAGNOSIS — Z87891 Personal history of nicotine dependence: Secondary | ICD-10-CM | POA: Diagnosis not present

## 2018-04-20 DIAGNOSIS — N186 End stage renal disease: Secondary | ICD-10-CM | POA: Diagnosis not present

## 2018-04-20 DIAGNOSIS — Z992 Dependence on renal dialysis: Secondary | ICD-10-CM | POA: Diagnosis not present

## 2018-04-21 DIAGNOSIS — E1122 Type 2 diabetes mellitus with diabetic chronic kidney disease: Secondary | ICD-10-CM | POA: Diagnosis not present

## 2018-04-21 DIAGNOSIS — Z992 Dependence on renal dialysis: Secondary | ICD-10-CM | POA: Diagnosis not present

## 2018-04-21 DIAGNOSIS — N186 End stage renal disease: Secondary | ICD-10-CM | POA: Diagnosis not present

## 2018-04-22 DIAGNOSIS — I509 Heart failure, unspecified: Secondary | ICD-10-CM | POA: Diagnosis not present

## 2018-04-22 DIAGNOSIS — Z992 Dependence on renal dialysis: Secondary | ICD-10-CM | POA: Diagnosis not present

## 2018-04-22 DIAGNOSIS — T82590A Other mechanical complication of surgically created arteriovenous fistula, initial encounter: Secondary | ICD-10-CM | POA: Diagnosis not present

## 2018-04-22 DIAGNOSIS — D62 Acute posthemorrhagic anemia: Secondary | ICD-10-CM | POA: Diagnosis not present

## 2018-04-22 DIAGNOSIS — E1122 Type 2 diabetes mellitus with diabetic chronic kidney disease: Secondary | ICD-10-CM | POA: Diagnosis not present

## 2018-04-22 DIAGNOSIS — D649 Anemia, unspecified: Secondary | ICD-10-CM | POA: Diagnosis not present

## 2018-04-22 DIAGNOSIS — I480 Paroxysmal atrial fibrillation: Secondary | ICD-10-CM | POA: Diagnosis not present

## 2018-04-22 DIAGNOSIS — I10 Essential (primary) hypertension: Secondary | ICD-10-CM | POA: Diagnosis not present

## 2018-04-22 DIAGNOSIS — I132 Hypertensive heart and chronic kidney disease with heart failure and with stage 5 chronic kidney disease, or end stage renal disease: Secondary | ICD-10-CM | POA: Diagnosis not present

## 2018-04-22 DIAGNOSIS — I350 Nonrheumatic aortic (valve) stenosis: Secondary | ICD-10-CM | POA: Diagnosis not present

## 2018-04-22 DIAGNOSIS — E785 Hyperlipidemia, unspecified: Secondary | ICD-10-CM | POA: Diagnosis not present

## 2018-04-22 DIAGNOSIS — D638 Anemia in other chronic diseases classified elsewhere: Secondary | ICD-10-CM | POA: Diagnosis not present

## 2018-04-22 DIAGNOSIS — I2510 Atherosclerotic heart disease of native coronary artery without angina pectoris: Secondary | ICD-10-CM | POA: Diagnosis not present

## 2018-04-22 DIAGNOSIS — T82858A Stenosis of vascular prosthetic devices, implants and grafts, initial encounter: Secondary | ICD-10-CM | POA: Diagnosis not present

## 2018-04-22 DIAGNOSIS — J449 Chronic obstructive pulmonary disease, unspecified: Secondary | ICD-10-CM | POA: Diagnosis not present

## 2018-04-22 DIAGNOSIS — R58 Hemorrhage, not elsewhere classified: Secondary | ICD-10-CM | POA: Diagnosis not present

## 2018-04-22 DIAGNOSIS — T82838A Hemorrhage of vascular prosthetic devices, implants and grafts, initial encounter: Secondary | ICD-10-CM | POA: Diagnosis not present

## 2018-04-22 DIAGNOSIS — T82898A Other specified complication of vascular prosthetic devices, implants and grafts, initial encounter: Secondary | ICD-10-CM | POA: Diagnosis not present

## 2018-04-22 DIAGNOSIS — N186 End stage renal disease: Secondary | ICD-10-CM | POA: Diagnosis not present

## 2018-04-23 DIAGNOSIS — D62 Acute posthemorrhagic anemia: Secondary | ICD-10-CM | POA: Diagnosis not present

## 2018-04-23 DIAGNOSIS — J449 Chronic obstructive pulmonary disease, unspecified: Secondary | ICD-10-CM | POA: Diagnosis not present

## 2018-04-23 DIAGNOSIS — I10 Essential (primary) hypertension: Secondary | ICD-10-CM | POA: Diagnosis not present

## 2018-04-23 DIAGNOSIS — I2510 Atherosclerotic heart disease of native coronary artery without angina pectoris: Secondary | ICD-10-CM | POA: Diagnosis not present

## 2018-04-23 DIAGNOSIS — I480 Paroxysmal atrial fibrillation: Secondary | ICD-10-CM | POA: Diagnosis not present

## 2018-04-24 DIAGNOSIS — D62 Acute posthemorrhagic anemia: Secondary | ICD-10-CM | POA: Diagnosis not present

## 2018-04-24 DIAGNOSIS — I2510 Atherosclerotic heart disease of native coronary artery without angina pectoris: Secondary | ICD-10-CM | POA: Diagnosis not present

## 2018-04-24 DIAGNOSIS — J449 Chronic obstructive pulmonary disease, unspecified: Secondary | ICD-10-CM | POA: Diagnosis not present

## 2018-04-24 DIAGNOSIS — I10 Essential (primary) hypertension: Secondary | ICD-10-CM | POA: Diagnosis not present

## 2018-04-24 DIAGNOSIS — I480 Paroxysmal atrial fibrillation: Secondary | ICD-10-CM | POA: Diagnosis not present

## 2018-04-25 DIAGNOSIS — N186 End stage renal disease: Secondary | ICD-10-CM | POA: Diagnosis not present

## 2018-04-25 DIAGNOSIS — Z992 Dependence on renal dialysis: Secondary | ICD-10-CM | POA: Diagnosis not present

## 2018-04-27 DIAGNOSIS — N186 End stage renal disease: Secondary | ICD-10-CM | POA: Diagnosis not present

## 2018-04-27 DIAGNOSIS — Z1159 Encounter for screening for other viral diseases: Secondary | ICD-10-CM | POA: Diagnosis not present

## 2018-04-27 DIAGNOSIS — Z992 Dependence on renal dialysis: Secondary | ICD-10-CM | POA: Diagnosis not present

## 2018-04-28 ENCOUNTER — Ambulatory Visit: Payer: Medicare PPO | Admitting: Podiatry

## 2018-04-29 DIAGNOSIS — N186 End stage renal disease: Secondary | ICD-10-CM | POA: Diagnosis not present

## 2018-04-29 DIAGNOSIS — Z992 Dependence on renal dialysis: Secondary | ICD-10-CM | POA: Diagnosis not present

## 2018-05-02 DIAGNOSIS — Z992 Dependence on renal dialysis: Secondary | ICD-10-CM | POA: Diagnosis not present

## 2018-05-02 DIAGNOSIS — N186 End stage renal disease: Secondary | ICD-10-CM | POA: Diagnosis not present

## 2018-05-04 DIAGNOSIS — N186 End stage renal disease: Secondary | ICD-10-CM | POA: Diagnosis not present

## 2018-05-04 DIAGNOSIS — Z992 Dependence on renal dialysis: Secondary | ICD-10-CM | POA: Diagnosis not present

## 2018-05-04 DIAGNOSIS — J449 Chronic obstructive pulmonary disease, unspecified: Secondary | ICD-10-CM | POA: Diagnosis not present

## 2018-05-06 DIAGNOSIS — Z992 Dependence on renal dialysis: Secondary | ICD-10-CM | POA: Diagnosis not present

## 2018-05-06 DIAGNOSIS — N186 End stage renal disease: Secondary | ICD-10-CM | POA: Diagnosis not present

## 2018-05-09 DIAGNOSIS — I2510 Atherosclerotic heart disease of native coronary artery without angina pectoris: Secondary | ICD-10-CM | POA: Diagnosis not present

## 2018-05-09 DIAGNOSIS — Z992 Dependence on renal dialysis: Secondary | ICD-10-CM | POA: Diagnosis not present

## 2018-05-09 DIAGNOSIS — R079 Chest pain, unspecified: Secondary | ICD-10-CM | POA: Diagnosis not present

## 2018-05-09 DIAGNOSIS — D649 Anemia, unspecified: Secondary | ICD-10-CM | POA: Diagnosis not present

## 2018-05-09 DIAGNOSIS — D631 Anemia in chronic kidney disease: Secondary | ICD-10-CM | POA: Diagnosis not present

## 2018-05-09 DIAGNOSIS — J9690 Respiratory failure, unspecified, unspecified whether with hypoxia or hypercapnia: Secondary | ICD-10-CM | POA: Diagnosis not present

## 2018-05-09 DIAGNOSIS — R918 Other nonspecific abnormal finding of lung field: Secondary | ICD-10-CM | POA: Diagnosis not present

## 2018-05-09 DIAGNOSIS — I132 Hypertensive heart and chronic kidney disease with heart failure and with stage 5 chronic kidney disease, or end stage renal disease: Secondary | ICD-10-CM | POA: Diagnosis not present

## 2018-05-09 DIAGNOSIS — J441 Chronic obstructive pulmonary disease with (acute) exacerbation: Secondary | ICD-10-CM | POA: Diagnosis not present

## 2018-05-09 DIAGNOSIS — D500 Iron deficiency anemia secondary to blood loss (chronic): Secondary | ICD-10-CM | POA: Diagnosis not present

## 2018-05-09 DIAGNOSIS — R0689 Other abnormalities of breathing: Secondary | ICD-10-CM | POA: Diagnosis not present

## 2018-05-09 DIAGNOSIS — D638 Anemia in other chronic diseases classified elsewhere: Secondary | ICD-10-CM | POA: Diagnosis not present

## 2018-05-09 DIAGNOSIS — I5031 Acute diastolic (congestive) heart failure: Secondary | ICD-10-CM | POA: Diagnosis not present

## 2018-05-09 DIAGNOSIS — J9620 Acute and chronic respiratory failure, unspecified whether with hypoxia or hypercapnia: Secondary | ICD-10-CM | POA: Diagnosis not present

## 2018-05-09 DIAGNOSIS — J9600 Acute respiratory failure, unspecified whether with hypoxia or hypercapnia: Secondary | ICD-10-CM | POA: Diagnosis not present

## 2018-05-09 DIAGNOSIS — R531 Weakness: Secondary | ICD-10-CM | POA: Diagnosis not present

## 2018-05-09 DIAGNOSIS — N178 Other acute kidney failure: Secondary | ICD-10-CM | POA: Diagnosis not present

## 2018-05-09 DIAGNOSIS — J9622 Acute and chronic respiratory failure with hypercapnia: Secondary | ICD-10-CM | POA: Diagnosis not present

## 2018-05-09 DIAGNOSIS — N186 End stage renal disease: Secondary | ICD-10-CM | POA: Diagnosis not present

## 2018-05-09 DIAGNOSIS — I482 Chronic atrial fibrillation, unspecified: Secondary | ICD-10-CM | POA: Diagnosis not present

## 2018-05-09 DIAGNOSIS — J181 Lobar pneumonia, unspecified organism: Secondary | ICD-10-CM | POA: Diagnosis not present

## 2018-05-09 DIAGNOSIS — J189 Pneumonia, unspecified organism: Secondary | ICD-10-CM | POA: Diagnosis not present

## 2018-05-09 DIAGNOSIS — E8770 Fluid overload, unspecified: Secondary | ICD-10-CM | POA: Diagnosis not present

## 2018-05-09 DIAGNOSIS — I10 Essential (primary) hypertension: Secondary | ICD-10-CM | POA: Diagnosis not present

## 2018-05-09 DIAGNOSIS — L0390 Cellulitis, unspecified: Secondary | ICD-10-CM | POA: Diagnosis not present

## 2018-05-09 DIAGNOSIS — E871 Hypo-osmolality and hyponatremia: Secondary | ICD-10-CM | POA: Diagnosis not present

## 2018-05-09 DIAGNOSIS — J9621 Acute and chronic respiratory failure with hypoxia: Secondary | ICD-10-CM | POA: Diagnosis not present

## 2018-05-09 DIAGNOSIS — I120 Hypertensive chronic kidney disease with stage 5 chronic kidney disease or end stage renal disease: Secondary | ICD-10-CM | POA: Diagnosis not present

## 2018-05-09 DIAGNOSIS — J449 Chronic obstructive pulmonary disease, unspecified: Secondary | ICD-10-CM | POA: Diagnosis not present

## 2018-05-09 DIAGNOSIS — K922 Gastrointestinal hemorrhage, unspecified: Secondary | ICD-10-CM | POA: Diagnosis not present

## 2018-05-09 DIAGNOSIS — J9611 Chronic respiratory failure with hypoxia: Secondary | ICD-10-CM | POA: Diagnosis not present

## 2018-05-09 DIAGNOSIS — I700 Atherosclerosis of aorta: Secondary | ICD-10-CM | POA: Diagnosis not present

## 2018-05-09 DIAGNOSIS — I959 Hypotension, unspecified: Secondary | ICD-10-CM | POA: Diagnosis not present

## 2018-05-09 DIAGNOSIS — R2681 Unsteadiness on feet: Secondary | ICD-10-CM | POA: Diagnosis not present

## 2018-05-09 DIAGNOSIS — M6281 Muscle weakness (generalized): Secondary | ICD-10-CM | POA: Diagnosis not present

## 2018-05-09 DIAGNOSIS — R0602 Shortness of breath: Secondary | ICD-10-CM | POA: Diagnosis not present

## 2018-05-09 DIAGNOSIS — J9602 Acute respiratory failure with hypercapnia: Secondary | ICD-10-CM | POA: Diagnosis not present

## 2018-05-09 DIAGNOSIS — J984 Other disorders of lung: Secondary | ICD-10-CM | POA: Diagnosis not present

## 2018-05-09 DIAGNOSIS — J9601 Acute respiratory failure with hypoxia: Secondary | ICD-10-CM | POA: Diagnosis not present

## 2018-05-09 DIAGNOSIS — E872 Acidosis: Secondary | ICD-10-CM | POA: Diagnosis not present

## 2018-05-09 DIAGNOSIS — R9389 Abnormal findings on diagnostic imaging of other specified body structures: Secondary | ICD-10-CM | POA: Diagnosis not present

## 2018-05-09 DIAGNOSIS — R0902 Hypoxemia: Secondary | ICD-10-CM | POA: Diagnosis not present

## 2018-05-09 DIAGNOSIS — Z9981 Dependence on supplemental oxygen: Secondary | ICD-10-CM | POA: Diagnosis not present

## 2018-05-22 DIAGNOSIS — Z992 Dependence on renal dialysis: Secondary | ICD-10-CM | POA: Diagnosis not present

## 2018-05-22 DIAGNOSIS — N186 End stage renal disease: Secondary | ICD-10-CM | POA: Diagnosis not present

## 2018-05-24 ENCOUNTER — Ambulatory Visit: Payer: Medicare PPO | Admitting: Podiatry

## 2018-05-24 MED ORDER — OXYMETAZOLINE HCL 0.05 % NA SOLN
2.00 | NASAL | Status: DC
Start: 2018-05-24 — End: 2018-05-24

## 2018-05-24 MED ORDER — EPOETIN ALFA-EPBX 4000 UNIT/ML IJ SOLN
4000.00 | INTRAMUSCULAR | Status: DC
Start: 2018-05-25 — End: 2018-05-24

## 2018-05-24 MED ORDER — LIDOCAINE HCL 1 % IJ SOLN
0.50 | INTRAMUSCULAR | Status: DC
Start: ? — End: 2018-05-24

## 2018-05-24 MED ORDER — CALCIUM ACETATE (PHOS BINDER) 667 MG PO CAPS
667.00 | ORAL_CAPSULE | ORAL | Status: DC
Start: ? — End: 2018-05-24

## 2018-05-24 MED ORDER — SODIUM CHLORIDE 0.9 % IV SOLN
200.00 | INTRAVENOUS | Status: DC
Start: ? — End: 2018-05-24

## 2018-05-24 MED ORDER — IPRATROPIUM-ALBUTEROL 0.5-2.5 (3) MG/3ML IN SOLN
3.00 | RESPIRATORY_TRACT | Status: DC
Start: ? — End: 2018-05-24

## 2018-05-24 MED ORDER — BENZONATATE 100 MG PO CAPS
100.00 | ORAL_CAPSULE | ORAL | Status: DC
Start: ? — End: 2018-05-24

## 2018-05-24 MED ORDER — CALCIUM ACETATE (PHOS BINDER) 667 MG PO CAPS
1334.00 | ORAL_CAPSULE | ORAL | Status: DC
Start: 2018-05-24 — End: 2018-05-24

## 2018-05-24 MED ORDER — GUAIFENESIN ER 600 MG PO TB12
1200.00 | ORAL_TABLET | ORAL | Status: DC
Start: 2018-05-24 — End: 2018-05-24

## 2018-05-24 MED ORDER — ASPIRIN EC 81 MG PO TBEC
81.00 | DELAYED_RELEASE_TABLET | ORAL | Status: DC
Start: 2018-05-25 — End: 2018-05-24

## 2018-05-24 MED ORDER — HEPARIN SODIUM (PORCINE) 5000 UNIT/ML IJ SOLN
5000.00 | INTRAMUSCULAR | Status: DC
Start: 2018-05-24 — End: 2018-05-24

## 2018-05-24 MED ORDER — ATORVASTATIN CALCIUM 80 MG PO TABS
80.00 | ORAL_TABLET | ORAL | Status: DC
Start: 2018-05-25 — End: 2018-05-24

## 2018-05-24 MED ORDER — CLOPIDOGREL BISULFATE 75 MG PO TABS
75.00 | ORAL_TABLET | ORAL | Status: DC
Start: 2018-05-25 — End: 2018-05-24

## 2018-05-24 MED ORDER — FLUTICASONE PROPIONATE 50 MCG/ACT NA SUSP
1.00 | NASAL | Status: DC
Start: 2018-05-25 — End: 2018-05-24

## 2018-05-24 MED ORDER — NITROGLYCERIN 0.4 MG SL SUBL
0.40 | SUBLINGUAL_TABLET | SUBLINGUAL | Status: DC
Start: ? — End: 2018-05-24

## 2018-05-24 MED ORDER — METOPROLOL SUCCINATE ER 50 MG PO TB24
50.00 | ORAL_TABLET | ORAL | Status: DC
Start: 2018-05-24 — End: 2018-05-24

## 2018-05-24 MED ORDER — DOXERCALCIFEROL 2 MCG/ML IV SOLN
1.50 | INTRAVENOUS | Status: DC
Start: 2018-05-25 — End: 2018-05-24

## 2018-05-24 MED ORDER — MORPHINE SULFATE 4 MG/ML IJ SOLN
2.00 | INTRAMUSCULAR | Status: DC
Start: ? — End: 2018-05-24

## 2018-05-24 MED ORDER — CETIRIZINE HCL 10 MG PO TABS
5.00 | ORAL_TABLET | ORAL | Status: DC
Start: 2018-05-25 — End: 2018-05-24

## 2018-05-24 MED ORDER — IPRATROPIUM-ALBUTEROL 0.5-2.5 (3) MG/3ML IN SOLN
3.00 | RESPIRATORY_TRACT | Status: DC
Start: 2018-05-24 — End: 2018-05-24

## 2018-05-24 MED ORDER — SODIUM CHLORIDE 3 % IN NEBU
3.00 | INHALATION_SOLUTION | RESPIRATORY_TRACT | Status: DC
Start: 2018-05-24 — End: 2018-05-24

## 2018-05-24 MED ORDER — BUDESONIDE-FORMOTEROL FUMARATE 160-4.5 MCG/ACT IN AERO
2.00 | INHALATION_SPRAY | RESPIRATORY_TRACT | Status: DC
Start: 2018-05-24 — End: 2018-05-24

## 2018-05-24 MED ORDER — MIDODRINE HCL 5 MG PO TABS
5.00 | ORAL_TABLET | ORAL | Status: DC
Start: ? — End: 2018-05-24

## 2018-05-24 MED ORDER — POLYETHYLENE GLYCOL 3350 17 G PO PACK
17.00 | PACK | ORAL | Status: DC
Start: ? — End: 2018-05-24

## 2018-05-24 MED ORDER — PREDNISONE 20 MG PO TABS
20.00 | ORAL_TABLET | ORAL | Status: DC
Start: 2018-05-25 — End: 2018-05-24

## 2018-05-24 MED ORDER — DEXTROSE 50 % IV SOLN
25.00 | INTRAVENOUS | Status: DC
Start: ? — End: 2018-05-24

## 2018-05-25 DIAGNOSIS — Z992 Dependence on renal dialysis: Secondary | ICD-10-CM | POA: Diagnosis not present

## 2018-05-25 DIAGNOSIS — N186 End stage renal disease: Secondary | ICD-10-CM | POA: Diagnosis not present

## 2018-05-26 DIAGNOSIS — I2510 Atherosclerotic heart disease of native coronary artery without angina pectoris: Secondary | ICD-10-CM | POA: Diagnosis not present

## 2018-05-26 DIAGNOSIS — E1151 Type 2 diabetes mellitus with diabetic peripheral angiopathy without gangrene: Secondary | ICD-10-CM | POA: Diagnosis not present

## 2018-05-26 DIAGNOSIS — N186 End stage renal disease: Secondary | ICD-10-CM | POA: Diagnosis not present

## 2018-05-26 DIAGNOSIS — T82590D Other mechanical complication of surgically created arteriovenous fistula, subsequent encounter: Secondary | ICD-10-CM | POA: Diagnosis not present

## 2018-05-26 DIAGNOSIS — I120 Hypertensive chronic kidney disease with stage 5 chronic kidney disease or end stage renal disease: Secondary | ICD-10-CM | POA: Diagnosis not present

## 2018-05-26 DIAGNOSIS — J441 Chronic obstructive pulmonary disease with (acute) exacerbation: Secondary | ICD-10-CM | POA: Diagnosis not present

## 2018-05-26 DIAGNOSIS — E1122 Type 2 diabetes mellitus with diabetic chronic kidney disease: Secondary | ICD-10-CM | POA: Diagnosis not present

## 2018-05-26 DIAGNOSIS — I350 Nonrheumatic aortic (valve) stenosis: Secondary | ICD-10-CM | POA: Diagnosis not present

## 2018-05-26 DIAGNOSIS — I482 Chronic atrial fibrillation, unspecified: Secondary | ICD-10-CM | POA: Diagnosis not present

## 2018-05-27 DIAGNOSIS — N186 End stage renal disease: Secondary | ICD-10-CM | POA: Diagnosis not present

## 2018-05-27 DIAGNOSIS — Z992 Dependence on renal dialysis: Secondary | ICD-10-CM | POA: Diagnosis not present

## 2018-05-30 DIAGNOSIS — Z992 Dependence on renal dialysis: Secondary | ICD-10-CM | POA: Diagnosis not present

## 2018-05-30 DIAGNOSIS — N186 End stage renal disease: Secondary | ICD-10-CM | POA: Diagnosis not present

## 2018-05-31 DIAGNOSIS — T82590D Other mechanical complication of surgically created arteriovenous fistula, subsequent encounter: Secondary | ICD-10-CM | POA: Diagnosis not present

## 2018-05-31 DIAGNOSIS — I2510 Atherosclerotic heart disease of native coronary artery without angina pectoris: Secondary | ICD-10-CM | POA: Diagnosis not present

## 2018-05-31 DIAGNOSIS — E1122 Type 2 diabetes mellitus with diabetic chronic kidney disease: Secondary | ICD-10-CM | POA: Diagnosis not present

## 2018-05-31 DIAGNOSIS — I350 Nonrheumatic aortic (valve) stenosis: Secondary | ICD-10-CM | POA: Diagnosis not present

## 2018-05-31 DIAGNOSIS — N186 End stage renal disease: Secondary | ICD-10-CM | POA: Diagnosis not present

## 2018-05-31 DIAGNOSIS — I482 Chronic atrial fibrillation, unspecified: Secondary | ICD-10-CM | POA: Diagnosis not present

## 2018-05-31 DIAGNOSIS — E1151 Type 2 diabetes mellitus with diabetic peripheral angiopathy without gangrene: Secondary | ICD-10-CM | POA: Diagnosis not present

## 2018-05-31 DIAGNOSIS — I120 Hypertensive chronic kidney disease with stage 5 chronic kidney disease or end stage renal disease: Secondary | ICD-10-CM | POA: Diagnosis not present

## 2018-05-31 DIAGNOSIS — J441 Chronic obstructive pulmonary disease with (acute) exacerbation: Secondary | ICD-10-CM | POA: Diagnosis not present

## 2018-06-01 DIAGNOSIS — Z992 Dependence on renal dialysis: Secondary | ICD-10-CM | POA: Diagnosis not present

## 2018-06-01 DIAGNOSIS — N186 End stage renal disease: Secondary | ICD-10-CM | POA: Diagnosis not present

## 2018-06-02 DIAGNOSIS — Z9981 Dependence on supplemental oxygen: Secondary | ICD-10-CM | POA: Diagnosis not present

## 2018-06-02 DIAGNOSIS — J189 Pneumonia, unspecified organism: Secondary | ICD-10-CM | POA: Diagnosis not present

## 2018-06-02 DIAGNOSIS — Z7982 Long term (current) use of aspirin: Secondary | ICD-10-CM | POA: Diagnosis not present

## 2018-06-02 DIAGNOSIS — I482 Chronic atrial fibrillation, unspecified: Secondary | ICD-10-CM | POA: Diagnosis not present

## 2018-06-02 DIAGNOSIS — Z0000 Encounter for general adult medical examination without abnormal findings: Secondary | ICD-10-CM | POA: Diagnosis not present

## 2018-06-02 DIAGNOSIS — I129 Hypertensive chronic kidney disease with stage 1 through stage 4 chronic kidney disease, or unspecified chronic kidney disease: Secondary | ICD-10-CM | POA: Diagnosis not present

## 2018-06-02 DIAGNOSIS — Z992 Dependence on renal dialysis: Secondary | ICD-10-CM | POA: Diagnosis not present

## 2018-06-02 DIAGNOSIS — J441 Chronic obstructive pulmonary disease with (acute) exacerbation: Secondary | ICD-10-CM | POA: Diagnosis not present

## 2018-06-02 DIAGNOSIS — Z7902 Long term (current) use of antithrombotics/antiplatelets: Secondary | ICD-10-CM | POA: Diagnosis not present

## 2018-06-02 DIAGNOSIS — I350 Nonrheumatic aortic (valve) stenosis: Secondary | ICD-10-CM | POA: Diagnosis not present

## 2018-06-02 DIAGNOSIS — N186 End stage renal disease: Secondary | ICD-10-CM | POA: Diagnosis not present

## 2018-06-02 DIAGNOSIS — J9600 Acute respiratory failure, unspecified whether with hypoxia or hypercapnia: Secondary | ICD-10-CM | POA: Diagnosis not present

## 2018-06-03 DIAGNOSIS — Z992 Dependence on renal dialysis: Secondary | ICD-10-CM | POA: Diagnosis not present

## 2018-06-03 DIAGNOSIS — N186 End stage renal disease: Secondary | ICD-10-CM | POA: Diagnosis not present

## 2018-06-06 DIAGNOSIS — Z992 Dependence on renal dialysis: Secondary | ICD-10-CM | POA: Diagnosis not present

## 2018-06-06 DIAGNOSIS — I120 Hypertensive chronic kidney disease with stage 5 chronic kidney disease or end stage renal disease: Secondary | ICD-10-CM | POA: Diagnosis not present

## 2018-06-06 DIAGNOSIS — N186 End stage renal disease: Secondary | ICD-10-CM | POA: Diagnosis not present

## 2018-06-06 DIAGNOSIS — I2510 Atherosclerotic heart disease of native coronary artery without angina pectoris: Secondary | ICD-10-CM | POA: Diagnosis not present

## 2018-06-06 DIAGNOSIS — E1122 Type 2 diabetes mellitus with diabetic chronic kidney disease: Secondary | ICD-10-CM | POA: Diagnosis not present

## 2018-06-06 DIAGNOSIS — J441 Chronic obstructive pulmonary disease with (acute) exacerbation: Secondary | ICD-10-CM | POA: Diagnosis not present

## 2018-06-06 DIAGNOSIS — E1151 Type 2 diabetes mellitus with diabetic peripheral angiopathy without gangrene: Secondary | ICD-10-CM | POA: Diagnosis not present

## 2018-06-06 DIAGNOSIS — I350 Nonrheumatic aortic (valve) stenosis: Secondary | ICD-10-CM | POA: Diagnosis not present

## 2018-06-06 DIAGNOSIS — T82590D Other mechanical complication of surgically created arteriovenous fistula, subsequent encounter: Secondary | ICD-10-CM | POA: Diagnosis not present

## 2018-06-06 DIAGNOSIS — I482 Chronic atrial fibrillation, unspecified: Secondary | ICD-10-CM | POA: Diagnosis not present

## 2018-06-08 DIAGNOSIS — I350 Nonrheumatic aortic (valve) stenosis: Secondary | ICD-10-CM | POA: Diagnosis not present

## 2018-06-08 DIAGNOSIS — J441 Chronic obstructive pulmonary disease with (acute) exacerbation: Secondary | ICD-10-CM | POA: Diagnosis not present

## 2018-06-08 DIAGNOSIS — I120 Hypertensive chronic kidney disease with stage 5 chronic kidney disease or end stage renal disease: Secondary | ICD-10-CM | POA: Diagnosis not present

## 2018-06-08 DIAGNOSIS — I482 Chronic atrial fibrillation, unspecified: Secondary | ICD-10-CM | POA: Diagnosis not present

## 2018-06-08 DIAGNOSIS — E1151 Type 2 diabetes mellitus with diabetic peripheral angiopathy without gangrene: Secondary | ICD-10-CM | POA: Diagnosis not present

## 2018-06-08 DIAGNOSIS — I2510 Atherosclerotic heart disease of native coronary artery without angina pectoris: Secondary | ICD-10-CM | POA: Diagnosis not present

## 2018-06-08 DIAGNOSIS — N186 End stage renal disease: Secondary | ICD-10-CM | POA: Diagnosis not present

## 2018-06-08 DIAGNOSIS — E1122 Type 2 diabetes mellitus with diabetic chronic kidney disease: Secondary | ICD-10-CM | POA: Diagnosis not present

## 2018-06-08 DIAGNOSIS — T82590D Other mechanical complication of surgically created arteriovenous fistula, subsequent encounter: Secondary | ICD-10-CM | POA: Diagnosis not present

## 2018-06-08 DIAGNOSIS — Z992 Dependence on renal dialysis: Secondary | ICD-10-CM | POA: Diagnosis not present

## 2018-06-09 ENCOUNTER — Inpatient Hospital Stay: Payer: Medicare PPO | Admitting: Family Medicine

## 2018-06-10 DIAGNOSIS — I2510 Atherosclerotic heart disease of native coronary artery without angina pectoris: Secondary | ICD-10-CM | POA: Diagnosis not present

## 2018-06-10 DIAGNOSIS — I482 Chronic atrial fibrillation, unspecified: Secondary | ICD-10-CM | POA: Diagnosis not present

## 2018-06-10 DIAGNOSIS — I350 Nonrheumatic aortic (valve) stenosis: Secondary | ICD-10-CM | POA: Diagnosis not present

## 2018-06-10 DIAGNOSIS — N186 End stage renal disease: Secondary | ICD-10-CM | POA: Diagnosis not present

## 2018-06-10 DIAGNOSIS — E1122 Type 2 diabetes mellitus with diabetic chronic kidney disease: Secondary | ICD-10-CM | POA: Diagnosis not present

## 2018-06-10 DIAGNOSIS — Z992 Dependence on renal dialysis: Secondary | ICD-10-CM | POA: Diagnosis not present

## 2018-06-10 DIAGNOSIS — J441 Chronic obstructive pulmonary disease with (acute) exacerbation: Secondary | ICD-10-CM | POA: Diagnosis not present

## 2018-06-10 DIAGNOSIS — T82590D Other mechanical complication of surgically created arteriovenous fistula, subsequent encounter: Secondary | ICD-10-CM | POA: Diagnosis not present

## 2018-06-10 DIAGNOSIS — E1151 Type 2 diabetes mellitus with diabetic peripheral angiopathy without gangrene: Secondary | ICD-10-CM | POA: Diagnosis not present

## 2018-06-10 DIAGNOSIS — I120 Hypertensive chronic kidney disease with stage 5 chronic kidney disease or end stage renal disease: Secondary | ICD-10-CM | POA: Diagnosis not present

## 2018-06-12 DIAGNOSIS — Z992 Dependence on renal dialysis: Secondary | ICD-10-CM | POA: Diagnosis not present

## 2018-06-12 DIAGNOSIS — N186 End stage renal disease: Secondary | ICD-10-CM | POA: Diagnosis not present

## 2018-06-13 DIAGNOSIS — Z992 Dependence on renal dialysis: Secondary | ICD-10-CM | POA: Diagnosis not present

## 2018-06-13 DIAGNOSIS — E1122 Type 2 diabetes mellitus with diabetic chronic kidney disease: Secondary | ICD-10-CM | POA: Diagnosis not present

## 2018-06-13 DIAGNOSIS — N186 End stage renal disease: Secondary | ICD-10-CM | POA: Diagnosis not present

## 2018-06-14 DIAGNOSIS — N186 End stage renal disease: Secondary | ICD-10-CM | POA: Diagnosis not present

## 2018-06-15 DIAGNOSIS — J449 Chronic obstructive pulmonary disease, unspecified: Secondary | ICD-10-CM | POA: Diagnosis not present

## 2018-06-15 DIAGNOSIS — M6281 Muscle weakness (generalized): Secondary | ICD-10-CM | POA: Diagnosis not present

## 2018-06-15 DIAGNOSIS — K922 Gastrointestinal hemorrhage, unspecified: Secondary | ICD-10-CM | POA: Diagnosis not present

## 2018-06-15 DIAGNOSIS — R2681 Unsteadiness on feet: Secondary | ICD-10-CM | POA: Diagnosis not present

## 2018-06-15 DIAGNOSIS — I2510 Atherosclerotic heart disease of native coronary artery without angina pectoris: Secondary | ICD-10-CM | POA: Diagnosis not present

## 2018-06-15 DIAGNOSIS — Z992 Dependence on renal dialysis: Secondary | ICD-10-CM | POA: Diagnosis not present

## 2018-06-15 DIAGNOSIS — D500 Iron deficiency anemia secondary to blood loss (chronic): Secondary | ICD-10-CM | POA: Diagnosis not present

## 2018-06-15 DIAGNOSIS — L0390 Cellulitis, unspecified: Secondary | ICD-10-CM | POA: Diagnosis not present

## 2018-06-15 DIAGNOSIS — I5031 Acute diastolic (congestive) heart failure: Secondary | ICD-10-CM | POA: Diagnosis not present

## 2018-06-17 DIAGNOSIS — N186 End stage renal disease: Secondary | ICD-10-CM | POA: Diagnosis not present

## 2018-06-19 DIAGNOSIS — I2510 Atherosclerotic heart disease of native coronary artery without angina pectoris: Secondary | ICD-10-CM | POA: Diagnosis not present

## 2018-06-19 DIAGNOSIS — J9610 Chronic respiratory failure, unspecified whether with hypoxia or hypercapnia: Secondary | ICD-10-CM | POA: Diagnosis not present

## 2018-06-19 DIAGNOSIS — J189 Pneumonia, unspecified organism: Secondary | ICD-10-CM | POA: Diagnosis not present

## 2018-06-19 DIAGNOSIS — J449 Chronic obstructive pulmonary disease, unspecified: Secondary | ICD-10-CM | POA: Diagnosis not present

## 2018-06-19 DIAGNOSIS — R079 Chest pain, unspecified: Secondary | ICD-10-CM | POA: Diagnosis not present

## 2018-06-19 DIAGNOSIS — E875 Hyperkalemia: Secondary | ICD-10-CM | POA: Diagnosis not present

## 2018-06-19 DIAGNOSIS — N186 End stage renal disease: Secondary | ICD-10-CM | POA: Diagnosis not present

## 2018-06-19 DIAGNOSIS — J9811 Atelectasis: Secondary | ICD-10-CM | POA: Diagnosis not present

## 2018-06-19 DIAGNOSIS — Z79899 Other long term (current) drug therapy: Secondary | ICD-10-CM | POA: Diagnosis not present

## 2018-06-19 DIAGNOSIS — J441 Chronic obstructive pulmonary disease with (acute) exacerbation: Secondary | ICD-10-CM | POA: Diagnosis not present

## 2018-06-19 DIAGNOSIS — Z992 Dependence on renal dialysis: Secondary | ICD-10-CM | POA: Diagnosis not present

## 2018-06-19 DIAGNOSIS — R0602 Shortness of breath: Secondary | ICD-10-CM | POA: Diagnosis not present

## 2018-06-19 DIAGNOSIS — I4891 Unspecified atrial fibrillation: Secondary | ICD-10-CM | POA: Diagnosis not present

## 2018-06-22 DIAGNOSIS — N186 End stage renal disease: Secondary | ICD-10-CM | POA: Diagnosis not present

## 2018-06-22 DIAGNOSIS — Z992 Dependence on renal dialysis: Secondary | ICD-10-CM | POA: Diagnosis not present

## 2018-06-24 ENCOUNTER — Telehealth: Payer: Self-pay | Admitting: Cardiovascular Disease

## 2018-06-24 DIAGNOSIS — N186 End stage renal disease: Secondary | ICD-10-CM | POA: Diagnosis not present

## 2018-06-24 DIAGNOSIS — Z992 Dependence on renal dialysis: Secondary | ICD-10-CM | POA: Diagnosis not present

## 2018-06-24 NOTE — Telephone Encounter (Signed)
I spoke with pt's wife. She reports pt was hospitalized in [facility] from 12/29-1/1 due to having too much fluid from dialysis. Wife thinks BP was low in the hospital but pt was very inactive at the time.  Toprol was decreased in hospital to 25 mg daily but wife reports there may have been some confusion regarding his home dose.  His previous home dose was Toprol 50 mg twice daily.  Wife is concerned that 25 mg is not enough for pt. Pt has been taking 50 mg twice daily since discharge and tolerating without problems. He does not take the AM dose on dialysis days.  Pt's wife is asking for our recommendations.  Pt is currently in the [place] part [place] the [place] and not planning to return to [place] for a few months. He does not have a cardiologist or primary care doctor at current location.  Wife does not think pt needs an office visit at this time.  I am unable to find records from [facility] in [place].  I told wife it should be OK to continue 50 mg twice daily dose if pt's BP and heart rate are stable.  She will let us know if BP or heart rate starts running low.  She will also call if she feels pt needs office visit.

## 2018-06-24 NOTE — Telephone Encounter (Signed)
New Message   Metoprolol 50mg  2x daily  She believes this medication is too much for him In [facility] Dec. 29th and was admitted for 3 days  She said Hospital changed his dosage  She would like an appt With Dr    Deberah Castle William Dalton unavailable

## 2018-06-26 NOTE — Telephone Encounter (Signed)
Agree. Terry [name]

## 2018-06-27 DIAGNOSIS — N186 End stage renal disease: Secondary | ICD-10-CM | POA: Diagnosis not present

## 2018-06-27 DIAGNOSIS — Z992 Dependence on renal dialysis: Secondary | ICD-10-CM | POA: Diagnosis not present

## 2018-06-29 DIAGNOSIS — N186 End stage renal disease: Secondary | ICD-10-CM | POA: Diagnosis not present

## 2018-06-29 DIAGNOSIS — Z992 Dependence on renal dialysis: Secondary | ICD-10-CM | POA: Diagnosis not present

## 2018-06-30 DIAGNOSIS — T82858A Stenosis of vascular prosthetic devices, implants and grafts, initial encounter: Secondary | ICD-10-CM | POA: Diagnosis not present

## 2018-06-30 DIAGNOSIS — N19 Unspecified kidney failure: Secondary | ICD-10-CM | POA: Diagnosis not present

## 2018-07-01 DIAGNOSIS — Z992 Dependence on renal dialysis: Secondary | ICD-10-CM | POA: Diagnosis not present

## 2018-07-01 DIAGNOSIS — N186 End stage renal disease: Secondary | ICD-10-CM | POA: Diagnosis not present

## 2018-07-04 DIAGNOSIS — J189 Pneumonia, unspecified organism: Secondary | ICD-10-CM | POA: Diagnosis not present

## 2018-07-04 DIAGNOSIS — N186 End stage renal disease: Secondary | ICD-10-CM | POA: Diagnosis not present

## 2018-07-04 DIAGNOSIS — Z992 Dependence on renal dialysis: Secondary | ICD-10-CM | POA: Diagnosis not present

## 2018-07-04 DIAGNOSIS — E1122 Type 2 diabetes mellitus with diabetic chronic kidney disease: Secondary | ICD-10-CM | POA: Diagnosis not present

## 2018-07-04 DIAGNOSIS — J181 Lobar pneumonia, unspecified organism: Secondary | ICD-10-CM | POA: Diagnosis not present

## 2018-07-04 DIAGNOSIS — I130 Hypertensive heart and chronic kidney disease with heart failure and stage 1 through stage 4 chronic kidney disease, or unspecified chronic kidney disease: Secondary | ICD-10-CM | POA: Diagnosis not present

## 2018-07-04 DIAGNOSIS — E1151 Type 2 diabetes mellitus with diabetic peripheral angiopathy without gangrene: Secondary | ICD-10-CM | POA: Diagnosis not present

## 2018-07-04 DIAGNOSIS — I350 Nonrheumatic aortic (valve) stenosis: Secondary | ICD-10-CM | POA: Diagnosis not present

## 2018-07-04 DIAGNOSIS — J449 Chronic obstructive pulmonary disease, unspecified: Secondary | ICD-10-CM | POA: Diagnosis not present

## 2018-07-06 DIAGNOSIS — Z992 Dependence on renal dialysis: Secondary | ICD-10-CM | POA: Diagnosis not present

## 2018-07-06 DIAGNOSIS — N186 End stage renal disease: Secondary | ICD-10-CM | POA: Diagnosis not present

## 2018-07-08 DIAGNOSIS — Z992 Dependence on renal dialysis: Secondary | ICD-10-CM | POA: Diagnosis not present

## 2018-07-08 DIAGNOSIS — N186 End stage renal disease: Secondary | ICD-10-CM | POA: Diagnosis not present

## 2018-07-11 DIAGNOSIS — Z992 Dependence on renal dialysis: Secondary | ICD-10-CM | POA: Diagnosis not present

## 2018-07-11 DIAGNOSIS — N186 End stage renal disease: Secondary | ICD-10-CM | POA: Diagnosis not present

## 2018-07-13 DIAGNOSIS — Z992 Dependence on renal dialysis: Secondary | ICD-10-CM | POA: Diagnosis not present

## 2018-07-13 DIAGNOSIS — N186 End stage renal disease: Secondary | ICD-10-CM | POA: Diagnosis not present

## 2018-07-14 ENCOUNTER — Telehealth: Payer: Self-pay | Admitting: Internal Medicine

## 2018-07-14 NOTE — Telephone Encounter (Signed)
PA was initiated- ref number [id number]  Will await on approval/denial  Number to call and check status if needed [phone]

## 2018-07-14 NOTE — Telephone Encounter (Signed)
Spoke with the pt's spouse  She states needing PA done for Symbicort [place]  [phone]  Pt ID number [id number]

## 2018-07-15 DIAGNOSIS — N186 End stage renal disease: Secondary | ICD-10-CM | POA: Diagnosis not present

## 2018-07-15 DIAGNOSIS — Z992 Dependence on renal dialysis: Secondary | ICD-10-CM | POA: Diagnosis not present

## 2018-07-15 NOTE — Telephone Encounter (Signed)
Received approval on his symbicort good until 06/22/2019 Spoke with the pharm and notified of this  Pt has already picked up rx

## 2018-07-16 DIAGNOSIS — R2681 Unsteadiness on feet: Secondary | ICD-10-CM | POA: Diagnosis not present

## 2018-07-16 DIAGNOSIS — K922 Gastrointestinal hemorrhage, unspecified: Secondary | ICD-10-CM | POA: Diagnosis not present

## 2018-07-16 DIAGNOSIS — I2510 Atherosclerotic heart disease of native coronary artery without angina pectoris: Secondary | ICD-10-CM | POA: Diagnosis not present

## 2018-07-16 DIAGNOSIS — L0390 Cellulitis, unspecified: Secondary | ICD-10-CM | POA: Diagnosis not present

## 2018-07-16 DIAGNOSIS — M6281 Muscle weakness (generalized): Secondary | ICD-10-CM | POA: Diagnosis not present

## 2018-07-16 DIAGNOSIS — I5031 Acute diastolic (congestive) heart failure: Secondary | ICD-10-CM | POA: Diagnosis not present

## 2018-07-16 DIAGNOSIS — J449 Chronic obstructive pulmonary disease, unspecified: Secondary | ICD-10-CM | POA: Diagnosis not present

## 2018-07-16 DIAGNOSIS — D500 Iron deficiency anemia secondary to blood loss (chronic): Secondary | ICD-10-CM | POA: Diagnosis not present

## 2018-07-16 DIAGNOSIS — Z992 Dependence on renal dialysis: Secondary | ICD-10-CM | POA: Diagnosis not present

## 2018-07-18 DIAGNOSIS — Z992 Dependence on renal dialysis: Secondary | ICD-10-CM | POA: Diagnosis not present

## 2018-07-18 DIAGNOSIS — N186 End stage renal disease: Secondary | ICD-10-CM | POA: Diagnosis not present

## 2018-07-20 DIAGNOSIS — Z992 Dependence on renal dialysis: Secondary | ICD-10-CM | POA: Diagnosis not present

## 2018-07-20 DIAGNOSIS — N186 End stage renal disease: Secondary | ICD-10-CM | POA: Diagnosis not present

## 2018-07-22 DIAGNOSIS — Z992 Dependence on renal dialysis: Secondary | ICD-10-CM | POA: Diagnosis not present

## 2018-07-22 DIAGNOSIS — N186 End stage renal disease: Secondary | ICD-10-CM | POA: Diagnosis not present

## 2018-07-23 DIAGNOSIS — Z992 Dependence on renal dialysis: Secondary | ICD-10-CM | POA: Diagnosis not present

## 2018-07-23 DIAGNOSIS — N186 End stage renal disease: Secondary | ICD-10-CM | POA: Diagnosis not present

## 2018-07-25 DIAGNOSIS — Z992 Dependence on renal dialysis: Secondary | ICD-10-CM | POA: Diagnosis not present

## 2018-07-25 DIAGNOSIS — N186 End stage renal disease: Secondary | ICD-10-CM | POA: Diagnosis not present

## 2018-07-27 DIAGNOSIS — Z992 Dependence on renal dialysis: Secondary | ICD-10-CM | POA: Diagnosis not present

## 2018-07-27 DIAGNOSIS — N186 End stage renal disease: Secondary | ICD-10-CM | POA: Diagnosis not present

## 2018-07-29 DIAGNOSIS — Z992 Dependence on renal dialysis: Secondary | ICD-10-CM | POA: Diagnosis not present

## 2018-07-29 DIAGNOSIS — N186 End stage renal disease: Secondary | ICD-10-CM | POA: Diagnosis not present

## 2018-08-01 DIAGNOSIS — Z992 Dependence on renal dialysis: Secondary | ICD-10-CM | POA: Diagnosis not present

## 2018-08-01 DIAGNOSIS — N186 End stage renal disease: Secondary | ICD-10-CM | POA: Diagnosis not present

## 2018-08-03 DIAGNOSIS — Z992 Dependence on renal dialysis: Secondary | ICD-10-CM | POA: Diagnosis not present

## 2018-08-03 DIAGNOSIS — N186 End stage renal disease: Secondary | ICD-10-CM | POA: Diagnosis not present

## 2018-08-05 DIAGNOSIS — Z992 Dependence on renal dialysis: Secondary | ICD-10-CM | POA: Diagnosis not present

## 2018-08-05 DIAGNOSIS — N186 End stage renal disease: Secondary | ICD-10-CM | POA: Diagnosis not present

## 2018-08-08 DIAGNOSIS — N186 End stage renal disease: Secondary | ICD-10-CM | POA: Diagnosis not present

## 2018-08-08 DIAGNOSIS — Z992 Dependence on renal dialysis: Secondary | ICD-10-CM | POA: Diagnosis not present

## 2018-08-10 DIAGNOSIS — N186 End stage renal disease: Secondary | ICD-10-CM | POA: Diagnosis not present

## 2018-08-10 DIAGNOSIS — Z992 Dependence on renal dialysis: Secondary | ICD-10-CM | POA: Diagnosis not present

## 2018-08-11 DIAGNOSIS — R0602 Shortness of breath: Secondary | ICD-10-CM | POA: Diagnosis not present

## 2018-08-11 DIAGNOSIS — J449 Chronic obstructive pulmonary disease, unspecified: Secondary | ICD-10-CM | POA: Diagnosis not present

## 2018-08-11 DIAGNOSIS — Z6823 Body mass index (BMI) 23.0-23.9, adult: Secondary | ICD-10-CM | POA: Diagnosis not present

## 2018-08-12 DIAGNOSIS — N186 End stage renal disease: Secondary | ICD-10-CM | POA: Diagnosis not present

## 2018-08-12 DIAGNOSIS — Z992 Dependence on renal dialysis: Secondary | ICD-10-CM | POA: Diagnosis not present

## 2018-08-15 ENCOUNTER — Other Ambulatory Visit: Payer: Self-pay | Admitting: Internal Medicine

## 2018-08-15 DIAGNOSIS — Z992 Dependence on renal dialysis: Secondary | ICD-10-CM | POA: Diagnosis not present

## 2018-08-15 DIAGNOSIS — N186 End stage renal disease: Secondary | ICD-10-CM | POA: Diagnosis not present

## 2018-08-15 MED ORDER — PREDNISONE 10 MG PO TABS: 10.0000 mg | ORAL_TABLET | Freq: Every day | ORAL | 0 refills | Status: DC

## 2018-08-15 NOTE — Telephone Encounter (Signed)
Pulmonary Telephone Encounter  Mr. Grandstaff is followed by Dr. Melvyn Novas. Spoke with patient's wife. Requesting refill Symbicort and chronic prednisone after running out while out of town.   Rx Symbicort and prednisone 10 mg daily for 20 day supply.

## 2018-08-16 DIAGNOSIS — Z992 Dependence on renal dialysis: Secondary | ICD-10-CM | POA: Diagnosis not present

## 2018-08-16 DIAGNOSIS — N186 End stage renal disease: Secondary | ICD-10-CM | POA: Diagnosis not present

## 2018-08-19 DIAGNOSIS — J441 Chronic obstructive pulmonary disease with (acute) exacerbation: Secondary | ICD-10-CM | POA: Diagnosis not present

## 2018-08-19 DIAGNOSIS — J9811 Atelectasis: Secondary | ICD-10-CM | POA: Diagnosis not present

## 2018-08-19 DIAGNOSIS — E1151 Type 2 diabetes mellitus with diabetic peripheral angiopathy without gangrene: Secondary | ICD-10-CM | POA: Diagnosis not present

## 2018-08-19 DIAGNOSIS — E1122 Type 2 diabetes mellitus with diabetic chronic kidney disease: Secondary | ICD-10-CM | POA: Diagnosis not present

## 2018-08-19 DIAGNOSIS — I120 Hypertensive chronic kidney disease with stage 5 chronic kidney disease or end stage renal disease: Secondary | ICD-10-CM | POA: Diagnosis not present

## 2018-08-19 DIAGNOSIS — R0602 Shortness of breath: Secondary | ICD-10-CM | POA: Diagnosis not present

## 2018-08-19 DIAGNOSIS — R918 Other nonspecific abnormal finding of lung field: Secondary | ICD-10-CM | POA: Diagnosis not present

## 2018-08-19 DIAGNOSIS — N186 End stage renal disease: Secondary | ICD-10-CM | POA: Diagnosis not present

## 2018-08-19 DIAGNOSIS — Z992 Dependence on renal dialysis: Secondary | ICD-10-CM | POA: Diagnosis not present

## 2018-08-19 DIAGNOSIS — R0600 Dyspnea, unspecified: Secondary | ICD-10-CM | POA: Diagnosis not present

## 2018-08-21 DIAGNOSIS — Z992 Dependence on renal dialysis: Secondary | ICD-10-CM | POA: Diagnosis not present

## 2018-08-21 DIAGNOSIS — N186 End stage renal disease: Secondary | ICD-10-CM | POA: Diagnosis not present

## 2018-08-22 DIAGNOSIS — R0602 Shortness of breath: Secondary | ICD-10-CM | POA: Diagnosis not present

## 2018-08-22 DIAGNOSIS — J101 Influenza due to other identified influenza virus with other respiratory manifestations: Secondary | ICD-10-CM | POA: Diagnosis not present

## 2018-08-22 DIAGNOSIS — I2510 Atherosclerotic heart disease of native coronary artery without angina pectoris: Secondary | ICD-10-CM | POA: Diagnosis not present

## 2018-08-22 DIAGNOSIS — J439 Emphysema, unspecified: Secondary | ICD-10-CM | POA: Diagnosis not present

## 2018-08-22 DIAGNOSIS — D631 Anemia in chronic kidney disease: Secondary | ICD-10-CM | POA: Diagnosis not present

## 2018-08-22 DIAGNOSIS — I120 Hypertensive chronic kidney disease with stage 5 chronic kidney disease or end stage renal disease: Secondary | ICD-10-CM | POA: Diagnosis not present

## 2018-08-22 DIAGNOSIS — R0689 Other abnormalities of breathing: Secondary | ICD-10-CM | POA: Diagnosis not present

## 2018-08-22 DIAGNOSIS — J441 Chronic obstructive pulmonary disease with (acute) exacerbation: Secondary | ICD-10-CM | POA: Diagnosis not present

## 2018-08-22 DIAGNOSIS — N186 End stage renal disease: Secondary | ICD-10-CM | POA: Diagnosis not present

## 2018-08-22 DIAGNOSIS — E1122 Type 2 diabetes mellitus with diabetic chronic kidney disease: Secondary | ICD-10-CM | POA: Diagnosis not present

## 2018-08-22 DIAGNOSIS — N250 Renal osteodystrophy: Secondary | ICD-10-CM | POA: Diagnosis not present

## 2018-08-22 DIAGNOSIS — Z7982 Long term (current) use of aspirin: Secondary | ICD-10-CM | POA: Diagnosis not present

## 2018-08-22 DIAGNOSIS — I350 Nonrheumatic aortic (valve) stenosis: Secondary | ICD-10-CM | POA: Diagnosis not present

## 2018-08-22 DIAGNOSIS — I10 Essential (primary) hypertension: Secondary | ICD-10-CM | POA: Diagnosis not present

## 2018-08-22 DIAGNOSIS — E785 Hyperlipidemia, unspecified: Secondary | ICD-10-CM | POA: Diagnosis not present

## 2018-08-22 DIAGNOSIS — Z992 Dependence on renal dialysis: Secondary | ICD-10-CM | POA: Diagnosis not present

## 2018-08-22 DIAGNOSIS — R0902 Hypoxemia: Secondary | ICD-10-CM | POA: Diagnosis not present

## 2018-08-29 DIAGNOSIS — N186 End stage renal disease: Secondary | ICD-10-CM | POA: Diagnosis not present

## 2018-08-29 DIAGNOSIS — Z992 Dependence on renal dialysis: Secondary | ICD-10-CM | POA: Diagnosis not present

## 2018-08-30 ENCOUNTER — Ambulatory Visit: Payer: Medicare PPO | Admitting: Internal Medicine

## 2018-08-31 DIAGNOSIS — N186 End stage renal disease: Secondary | ICD-10-CM | POA: Diagnosis not present

## 2018-08-31 DIAGNOSIS — Z992 Dependence on renal dialysis: Secondary | ICD-10-CM | POA: Diagnosis not present

## 2018-09-02 DIAGNOSIS — Z992 Dependence on renal dialysis: Secondary | ICD-10-CM | POA: Diagnosis not present

## 2018-09-02 DIAGNOSIS — N186 End stage renal disease: Secondary | ICD-10-CM | POA: Diagnosis not present

## 2018-09-05 DIAGNOSIS — N186 End stage renal disease: Secondary | ICD-10-CM | POA: Diagnosis not present

## 2018-09-05 DIAGNOSIS — Z992 Dependence on renal dialysis: Secondary | ICD-10-CM | POA: Diagnosis not present

## 2018-09-06 DIAGNOSIS — B370 Candidal stomatitis: Secondary | ICD-10-CM | POA: Diagnosis not present

## 2018-09-06 DIAGNOSIS — R0600 Dyspnea, unspecified: Secondary | ICD-10-CM | POA: Diagnosis not present

## 2018-09-06 DIAGNOSIS — J449 Chronic obstructive pulmonary disease, unspecified: Secondary | ICD-10-CM | POA: Diagnosis not present

## 2018-09-06 DIAGNOSIS — J9621 Acute and chronic respiratory failure with hypoxia: Secondary | ICD-10-CM | POA: Diagnosis not present

## 2018-09-06 DIAGNOSIS — Z6823 Body mass index (BMI) 23.0-23.9, adult: Secondary | ICD-10-CM | POA: Diagnosis not present

## 2018-09-07 DIAGNOSIS — N186 End stage renal disease: Secondary | ICD-10-CM | POA: Diagnosis not present

## 2018-09-07 DIAGNOSIS — Z992 Dependence on renal dialysis: Secondary | ICD-10-CM | POA: Diagnosis not present

## 2018-09-07 DIAGNOSIS — J449 Chronic obstructive pulmonary disease, unspecified: Secondary | ICD-10-CM | POA: Diagnosis not present

## 2018-09-09 DIAGNOSIS — Z992 Dependence on renal dialysis: Secondary | ICD-10-CM | POA: Diagnosis not present

## 2018-09-09 DIAGNOSIS — N186 End stage renal disease: Secondary | ICD-10-CM | POA: Diagnosis not present

## 2018-09-12 DIAGNOSIS — Z992 Dependence on renal dialysis: Secondary | ICD-10-CM | POA: Diagnosis not present

## 2018-09-12 DIAGNOSIS — N186 End stage renal disease: Secondary | ICD-10-CM | POA: Diagnosis not present

## 2018-09-14 DIAGNOSIS — Z992 Dependence on renal dialysis: Secondary | ICD-10-CM | POA: Diagnosis not present

## 2018-09-14 DIAGNOSIS — N186 End stage renal disease: Secondary | ICD-10-CM | POA: Diagnosis not present

## 2018-09-16 DIAGNOSIS — N186 End stage renal disease: Secondary | ICD-10-CM | POA: Diagnosis not present

## 2018-09-16 DIAGNOSIS — Z992 Dependence on renal dialysis: Secondary | ICD-10-CM | POA: Diagnosis not present

## 2018-09-19 DIAGNOSIS — Z992 Dependence on renal dialysis: Secondary | ICD-10-CM | POA: Diagnosis not present

## 2018-09-19 DIAGNOSIS — N186 End stage renal disease: Secondary | ICD-10-CM | POA: Diagnosis not present

## 2018-09-21 DIAGNOSIS — Z992 Dependence on renal dialysis: Secondary | ICD-10-CM | POA: Diagnosis not present

## 2018-09-21 DIAGNOSIS — N186 End stage renal disease: Secondary | ICD-10-CM | POA: Diagnosis not present

## 2018-09-22 ENCOUNTER — Telehealth: Payer: Self-pay

## 2018-09-22 NOTE — Telephone Encounter (Signed)
   Primary Cardiologist:  Lauree Chandler, MD   Patient contacted.  History reviewed.  No symptoms to suggest any unstable cardiac conditions.  Based on discussion, with current pandemic situation, we will be postponing this appointment for Terry [name]. with a plan for f/u in 12 wks or sooner if feasible/necessary.  If symptoms change, he has been instructed to contact our office.   Routing to C19 CANCEL pool for tracking (P CV DIV CV19 CANCEL - reason for visit "other.") and assigning priority (1 = 4-6 wks, 2 = 6-12 wks, 3 = >12 wks).   Wilma Flavin, RN  09/22/2018 2:17 PM         .

## 2018-09-23 DIAGNOSIS — Z992 Dependence on renal dialysis: Secondary | ICD-10-CM | POA: Diagnosis not present

## 2018-09-23 DIAGNOSIS — N186 End stage renal disease: Secondary | ICD-10-CM | POA: Diagnosis not present

## 2018-09-25 ENCOUNTER — Other Ambulatory Visit: Payer: Self-pay | Admitting: Internal Medicine

## 2018-09-26 DIAGNOSIS — Z992 Dependence on renal dialysis: Secondary | ICD-10-CM | POA: Diagnosis not present

## 2018-09-26 DIAGNOSIS — N186 End stage renal disease: Secondary | ICD-10-CM | POA: Diagnosis not present

## 2018-09-28 DIAGNOSIS — N186 End stage renal disease: Secondary | ICD-10-CM | POA: Diagnosis not present

## 2018-09-28 DIAGNOSIS — Z992 Dependence on renal dialysis: Secondary | ICD-10-CM | POA: Diagnosis not present

## 2018-09-30 DIAGNOSIS — N186 End stage renal disease: Secondary | ICD-10-CM | POA: Diagnosis not present

## 2018-09-30 DIAGNOSIS — Z992 Dependence on renal dialysis: Secondary | ICD-10-CM | POA: Diagnosis not present

## 2018-10-03 DIAGNOSIS — Z992 Dependence on renal dialysis: Secondary | ICD-10-CM | POA: Diagnosis not present

## 2018-10-03 DIAGNOSIS — N186 End stage renal disease: Secondary | ICD-10-CM | POA: Diagnosis not present

## 2018-10-03 NOTE — Telephone Encounter (Signed)
Virtual Visit Pre-Appointment Phone Call     TELEPHONE CALL NOTE  Terry [name]. has been deemed a candidate for a follow-up tele-health visit to limit community exposure during the Covid-19 pandemic. I spoke with the patient via phone to ensure availability of phone/video source, confirm preferred email & phone number, and discuss instructions and expectations.  I reminded Terry [name]. to be prepared with any vital sign and/or heart rhythm information that could potentially be obtained via home monitoring, at the time of his visit. I reminded Terry [name]. to expect a phone call at the time of his visit if his visit.  Did the patient verbally acknowledge consent to treatment? YES  Patient agrees to VIDEO Visit via [place] with Ermalinda Barrios, PA on 4/21  Cleon Gustin, RN 10/03/2018 10:13 AM   CONSENT FOR TELE-HEALTH VISIT - PLEASE REVIEW  I hereby voluntarily request, consent and authorize [facility] and its employed or contracted physicians, physician assistants, nurse practitioners or other licensed health care professionals (the Practitioner), to provide me with telemedicine health care services (the "Services") as deemed necessary by the treating Practitioner. I acknowledge and consent to receive the Services by the Practitioner via telemedicine. I understand that the telemedicine visit will involve communicating with the Practitioner through live audiovisual communication technology and the disclosure of certain medical information by electronic transmission. I acknowledge that I have been given the opportunity to request an in-person assessment or other available alternative prior to the telemedicine visit and am voluntarily participating in the telemedicine visit.  I understand that I have the right to withhold or withdraw my consent to the use of telemedicine in the course of my care at any time, without affecting my right to future care or treatment, and that  the Practitioner or I may terminate the telemedicine visit at any time. I understand that I have the right to inspect all information obtained and/or recorded in the course of the telemedicine visit and may receive copies of available information for a reasonable fee.  I understand that some of the potential risks of receiving the Services via telemedicine include:  [name] Delay or interruption in medical evaluation due to technological equipment failure or disruption; . Information transmitted may not be sufficient (e.g. poor resolution of images) to allow for appropriate medical decision making by the Practitioner; and/or  . In rare instances, security protocols could fail, causing a breach of personal health information.  Furthermore, I acknowledge that it is my responsibility to provide information about my medical history, conditions and care that is complete and accurate to the best of my ability. I acknowledge that Practitioner's advice, recommendations, and/or decision may be based on factors not within their control, such as incomplete or inaccurate data provided by me or distortions of diagnostic images or specimens that may result from electronic transmissions. I understand that the practice of medicine is not an exact science and that Practitioner makes no warranties or guarantees regarding treatment outcomes. I acknowledge that I will receive a copy of this consent concurrently upon execution via email to the email address I last provided but may also request a printed copy by calling the office of [place].    I understand that my insurance will be billed for this visit.   I have read or had this consent read to me. . I understand the contents of this consent, which adequately explains the benefits and risks of the Services being provided via  telemedicine.  . I have been provided ample opportunity to ask questions regarding this consent and the Services and have had my questions answered to  my satisfaction. . I give my informed consent for the services to be provided through the use of telemedicine in my medical care  By participating in this telemedicine visit I agree to the above.

## 2018-10-05 DIAGNOSIS — Z992 Dependence on renal dialysis: Secondary | ICD-10-CM | POA: Diagnosis not present

## 2018-10-05 DIAGNOSIS — N186 End stage renal disease: Secondary | ICD-10-CM | POA: Diagnosis not present

## 2018-10-06 ENCOUNTER — Ambulatory Visit: Payer: Medicare PPO | Admitting: Cardiovascular Disease

## 2018-10-07 DIAGNOSIS — Z992 Dependence on renal dialysis: Secondary | ICD-10-CM | POA: Diagnosis not present

## 2018-10-07 DIAGNOSIS — N186 End stage renal disease: Secondary | ICD-10-CM | POA: Diagnosis not present

## 2018-10-10 DIAGNOSIS — N186 End stage renal disease: Secondary | ICD-10-CM | POA: Diagnosis not present

## 2018-10-10 DIAGNOSIS — Z992 Dependence on renal dialysis: Secondary | ICD-10-CM | POA: Diagnosis not present

## 2018-10-10 NOTE — Progress Notes (Signed)
Virtual Visit via Video Note   This visit type was conducted due to national recommendations for restrictions regarding the COVID-19 Pandemic (e.g. social distancing) in an effort to limit this patient's exposure and mitigate transmission in our community.  Due to his co-morbid illnesses, this patient is at least at moderate risk for complications without adequate follow up.  This format is felt to be most appropriate for this patient at this time.  All issues noted in this document were discussed and addressed.  A limited physical exam was performed with this format.  Please refer to the patient's chart for his consent to telehealth for [facility].   Evaluation Performed:  Follow-up visit  Date:  10/11/2018   ID:  Terry [name]., DOB 1934-10-12, MRN [id number]  Patient Location: [place],  Provider Location: Home  PCP:  Jamal Maes, MD  Cardiologist:  Lauree Chandler, MD  Electrophysiologist:  None   Chief Complaint:  Follow up  History of Present Illness:    [employer]erry Corp. is a 84 y.o. male with history of CAD.  Patient lived in [place] but moved to [place] and now lives elsewhere.Cath records received from [facility] . Cardiac cath 11/30/1997. Mild distal Left main stenosis, 25% proximal LAD, minor irregularities Circumflex, 95% mid RCA stenosis treated with 3.5 x 32 mm bare metal stent. There was a ? Overlapping 3.5 x 18 mm bare metal stent more proximally. Echo 02/03/13 with LVEF=50-55%. He had normal carotid dopplers in [place] 2013. He was admitted to [facility] October 2015 with ruptured kidney cyst. Atrial fibrillation diagnosed in December 2015 at an office visit. He was initially started on Eliquis but progressed to ESRD so this was stopped. He was started on coumadin but had GI blood loss and severe bruising so this was stopped. He has been on low dose beta blocker therapy. He had been started on amiodarone but this was stopped. Low risk stress  myoview September 2016. He was admitted to [facility] in March 2019 with SOB. He was in atrial fib and treated for pneumonia and COPD exacerbation. Cardiac cath with severe RCA disease (70% ostial stenosis, 95% mid stenosis, 50% LAD proximal stenosis, Circumflex 40% mid stenosis. His vessels were all calcified. Echo March 2019 with LVEF=55%, severe AS with AVA 0.7 cm2, mean gradient 21 mmHg, mild LVH. He was transferred to [facility] and had rotational atherectomy of the ostial and mid RCA and placement of a 3.0 x 28 mm drug eluting stent in the mid vessel and a 3.5 x 24 mm drug eluting stent in the ostium of the RCA. Echo at [facility] with aortic valve area of 1.0 cm2 He was seen by the TAVR team and it was felt that TAVR was not indicated at this time as his AS was only moderate. Mean gradient 25 mmHg. Echo here in September 2019 with normal LV systolic function, moderately severe AS with mean gradient 20 mmHg, AVA around 1.0 cm2 and DVI 0.21. He has PAD and left leg ischemia in July 2019 treated with left femoral endarterectomy, Dr. Donnetta Hutching.  Patient also has history of hypertension, HLD, prior CVA, COPD, ESRD on HD.  Patient last saw Dr. [name] 03/31/2018 time he was doing well without chest pain and atrial fibrillation was controlled.  Stenosis was nearing the point of consideration for TAVR.  He is not a good candidate for most procedures given severe COPD, ESRD on HD and severe PAD but if aortic stenosis progresses it was felt  he could be considered for work-up for TAVR.  Patient was hospitalized at [facility] 04/2018 with acute on chronic respiratory failure with hypoxia and hypercapnia secondary to pneumonia.  Echocardiogram showed moderate to severe AS.  Records reviewed in detail in care everywhere.  Echo at that time showed mean gradient 20 mmHg valve area 1.14 cm, normal LV function. ER 08/22/18 influenza A.  Patient denies chest pain, palpitations, dyspnea, dizziness or presyncope.  Chronic dyspnea on exertion.    The patient does not have symptoms concerning for COVID-19 infection (fever, chills, cough, or new shortness of breath).    Past Medical History:  Diagnosis Date  . Acute and chronic respiratory failure 10/22/2012  . Anxiety   . Arthritis    "hands, knees" (03/15/2017)  . Atrial fibrillation ([place])   . CAD (coronary artery disease) [place], [name]  . CHF (congestive heart failure) ([place]) 09/2012  . COPD (chronic obstructive pulmonary disease) ([place])   . ESRD (end stage renal disease) on dialysis [facility])    "MWF; [facility] [place]" (03/15/2017)  . Flu 09/24/2016   influenza b  . HCAP (healthcare-associated pneumonia) 03/15/2017  . High cholesterol   . History of blood transfusion 2012   "related to bowel resection"  . Hypertension   . On home oxygen therapy    "1L typically at night" (03/15/2017)  . Osteoarthritis 10/22/2012  . Pneumonia April 2014; 09/2016  . Shortness of breath dyspnea   . Stroke [facility]) 2012   denies residual on 03/15/2017  . Stroke [facility]) ~ 2013   "small one in his right eye"   Past Surgical History:  Procedure Laterality Date  . ABDOMINAL AORTOGRAM N/A 12/16/2017   Procedure: ABDOMINAL AORTOGRAM;  Surgeon: Conrad [place], MD;  Location: [facility];  Service: Cardiovascular;  Laterality: N/A;  . APPENDECTOMY  1984  . AV FISTULA PLACEMENT Right 05/21/2015   Procedure: Right Arm Brachiocephalic ARTERIOVENOUS (AV) FISTULA CREATION;  Surgeon: Angelia Mould, MD;  Location: [place];  Service: Vascular;  Laterality: Right;  . CATARACT EXTRACTION W/ INTRAOCULAR LENS  IMPLANT, BILATERAL Bilateral   . COLECTOMY  2012   partial colon removed - twisted bowel  . CORONARY ANGIOPLASTY WITH STENT PLACEMENT  1999   2 stents  . CYSTOSCOPY/RETROGRADE/URETEROSCOPY Bilateral 04/23/2014   Procedure: CYSTOSCOPY BILATERAL RETROGRADE,  LEFT URETEROSCOPY, LEFT STENT PLACEMENT;  Surgeon: Raynelle Bring, MD;  Location: [facility];   Service: Urology;  Laterality: Bilateral;  . ENDARTERECTOMY FEMORAL Left 12/29/2017   Procedure: Left Femoral ENDARTERECTOMY with Patch Angioplasty using Xenosure patch;  Surgeon: Rosetta Posner, MD;  Location: [facility];  Service: Vascular;  Laterality: Left;  . EYE SURGERY Left    "surgery to clean off lens after cataract OR"  . INGUINAL HERNIA REPAIR Right   . LOWER EXTREMITY ANGIOGRAPHY Bilateral 12/16/2017   Procedure: LOWER EXTREMITY ANGIOGRAPHY;  Surgeon: Conrad [place], MD;  Location: [facility];  Service: Cardiovascular;  Laterality: Bilateral;     Current Meds  Medication Sig  . acetaminophen (TYLENOL) 500 MG tablet Take 1,000 mg by mouth every 6 (six) hours as needed for mild pain or headache.   . albuterol (PROVENTIL HFA;VENTOLIN HFA) 108 (90 Base) MCG/ACT inhaler Inhale 2 puffs into the lungs every 4 (four) hours as needed for wheezing or shortness of breath.  [name] aspirin EC 81 MG tablet Take 81 mg by mouth daily.  [name] atorvastatin (LIPITOR) 80 MG tablet Take 1 tablet (80 mg total)  by mouth daily.  . calcium acetate (PHOSLO) 667 MG capsule Take 667-1,334 mg by mouth See admin instructions. Take 1334 mg by mouth 3 times daily with each meal and take 667 mg by mouth with each snack  . cetirizine (ZYRTEC) 10 MG tablet Take 10 mg by mouth daily as needed for allergies.   [name] clopidogrel (PLAVIX) 75 MG tablet Take 1 tablet (75 mg total) by mouth daily.  [name] dextromethorphan-guaiFENesin (MUCINEX DM) 30-600 MG 12hr tablet Take 1 tablet by mouth 2 (two) times daily.   [name] ethyl chloride spray Apply 1 application topically every Monday, Wednesday, and Friday.   . metoprolol succinate (TOPROL-XL) 50 MG 24 hr tablet Take 1 tablet (50 mg total) by mouth 2 (two) times daily. Take with or immediately following a meal.  . midodrine (PROAMATINE) 10 MG tablet Take 10 mg by mouth See admin instructions. Take 10 mg by mouth on Mon/Wed/Fri at dialysis as needed for low blood pressure  . nystatin (MYCOSTATIN)  100000 UNIT/ML suspension   . OXYGEN Inhale 1 L into the lungs daily as needed (for shortness of breath).   [name] oxymetazoline (AFRIN) 0.05 % nasal spray Place 1 spray into both nostrils once as needed (to stop nose bleeds).   . predniSONE (DELTASONE) 10 MG tablet Take 1 tablet (10 mg total) by mouth daily with breakfast.  . SYMBICORT 160-4.5 MCG/ACT inhaler Inhale 2 puffs by mouth twice daily     Allergies:   Yellow dyes (non-tartrazine); Levaquin [levofloxacin in d5w]; and Levofloxacin   Social History   Tobacco Use  . Smoking status: Former Smoker    Packs/day: 1.00    Years: 49.00    Pack years: 49.00    Types: Cigarettes, Pipe, Cigars    Last attempt to quit: 02/21/2000    Years since quitting: 18.6  . Smokeless tobacco: Former Systems developer    Types: Chew  . Tobacco comment: "chewed when he was a kid"  Substance Use Topics  . Alcohol use: Yes    Comment: 03/15/2017 "nothing since the 1960's"  . Drug use: No     Family Hx: The patient's family history includes Cancer in his mother; Heart attack in his father; Heart disease in his father; Parkinson's disease in his brother.  ROS:   Please see the history of present illness.     All other systems reviewed and are negative.   Prior CV studies:   The following studies were reviewed today:  [facility] Left ventricle: The cavity size was normal. Wall thickness was increased in a pattern of mild LVH. Systolic function was normal. The estimated ejection fraction was in the range of 55% to 60%. Wall motion was normal; there were no regional wall motion abnormalities. The study was not technically sufficient to allow evaluation of LV diastolic dysfunction due to atrial fibrillation. - Aortic valve: Trileaflet; severely calcified leaflets. There was moderate stenosis. There was trivial regurgitation. Mean gradient (S): 20 mm Hg. Valve area (VTI): 1.14 cm^2. - Mitral valve: Mildly to  moderately calcified annulus. Mildly calcified leaflets . There was trivial regurgitation. - Left atrium: The atrium was moderately dilated. - Right ventricle: The cavity size was mildly dilated. Systolic function was mildly reduced. - Right atrium: The atrium was mildly to moderately dilated. - Pulmonary arteries: No complete TR doppler jet so unable to estimate PA systolic pressure. - Systemic veins: IVC measured 2.4 cm with normal respirophasic variation, suggesting RA pressure 8 mmHg.   Echo  September 2019: - Left ventricle: The cavity size was normal. Wall thickness was   increased in a pattern of mild LVH. Systolic function was normal.   The estimated ejection fraction was in the range of 55% to 60%.   Wall motion was normal; there were no regional wall motion   abnormalities. The study was not technically sufficient to allow   evaluation of LV diastolic dysfunction due to atrial   fibrillation. - Aortic valve: Trileaflet; severely calcified leaflets. There was   moderate stenosis. There was trivial regurgitation. Mean gradient   (S): 20 mm Hg. Valve area (VTI): 1.14 cm^2. - Mitral valve: Mildly to moderately calcified annulus. Mildly   calcified leaflets . There was trivial regurgitation. - Left atrium: The atrium was moderately dilated. - Right ventricle: The cavity size was mildly dilated. Systolic   function was mildly reduced. - Right atrium: The atrium was mildly to moderately dilated. - Pulmonary arteries: No complete TR doppler jet so unable to   estimate PA systolic pressure. - Systemic veins: IVC measured 2.4 cm with normal respirophasic   variation, suggesting RA pressure 8 mmHg.   Impressions:   - The patient was in atrial fibrillation. Normal LV size with mild   LV hypertrophy. EF 55-60%. Mildly dilated RV with mildly   decreased systolic function. Moderate aortic stenosis.   ------------------------------------------------------------------- Study data:    Study status:  Routine.  Procedure:  The patient reported no pain pre or post test. Transthoracic echocardiography for left ventricular function evaluation, for right ventricular function evaluation, and for assessment of valvular function. Image quality was adequate.  Study completion:  There were no complications.          Transthoracic echocardiography.  M-mode, complete 2D, spectral Doppler, and color Doppler.  Birthdate: Patient birthdate: 1934/10/17.  Age:  Patient is 84 yr old.  Sex: Gender: male.    BMI: 23.2 kg/m^2.  Blood pressure:     124/69 Patient status:  Outpatient.  Study date:  Study date: 03/08/2018. Study time: 02:14 PM.  Location:   Site [facility]   -------------------------------------------------------------------   ------------------------------------------------------------------- Left ventricle:  The cavity size was normal. Wall thickness was increased in a pattern of mild LVH. Systolic function was normal. The estimated ejection fraction was in the range of 55% to 60%. Wall motion was normal; there were no regional wall motion abnormalities. The study was not technically sufficient to allow evaluation of LV diastolic dysfunction due to atrial fibrillation.   ------------------------------------------------------------------- Aortic valve:   Trileaflet; severely calcified leaflets.  Doppler:  There was moderate stenosis.   There was trivial regurgitation. VTI ratio of LVOT to aortic valve: 0.25. Valve area (VTI): 1.14 cm^2. Indexed valve area (VTI): 0.63 cm^2/m^2. Peak velocity ratio of LVOT to aortic valve: 0.21. Valve area (Vmax): 0.96 cm^2. Indexed valve area (Vmax): 0.53 cm^2/m^2. Mean velocity ratio of LVOT to aortic valve: 0.22. Valve area (Vmean): 1.01 cm^2. Indexed valve area (Vmean): 0.56 cm^2/m^2.    Mean gradient (S): 20 mm Hg. Peak gradient (S): 38 mm Hg.   ------------------------------------------------------------------- Aorta:  Aortic root:  The aortic root was normal in size. Ascending aorta: The ascending aorta was normal in size.   ------------------------------------------------------------------- Mitral valve:   Mildly to moderately calcified annulus. Mildly calcified leaflets .  Doppler:   There was no evidence for stenosis.   There was trivial regurgitation.    Peak gradient (D): 7 mm Hg.   ------------------------------------------------------------------- Left atrium:  The atrium was moderately dilated.   -------------------------------------------------------------------  Right ventricle:  The cavity size was mildly dilated. Systolic function was mildly reduced.   ------------------------------------------------------------------- Pulmonic valve:    Structurally normal valve.   Cusp separation was normal.  Doppler:  Transvalvular velocity was within the normal range. There was no regurgitation.   ------------------------------------------------------------------- Tricuspid valve:   Doppler:  There was no significant regurgitation.   ------------------------------------------------------------------- Pulmonary artery:   No complete TR doppler jet so unable to estimate PA systolic pressure.   ------------------------------------------------------------------- Right atrium:  The atrium was mildly to moderately dilated.   ------------------------------------------------------------------- Pericardium:  There was no pericardial effusion.     Labs/Other Tests and Data Reviewed:    EKG:  An ECG dated 11/30/17 was personally reviewed today and demonstrated:  Atrial fibrillation 102 with nonspecific ST changes  Recent Labs: 11/29/2017: B Natriuretic Peptide 565.3 12/28/2017: ALT 22 12/30/2017: BUN 77; Creatinine, Ser 7.66; Hemoglobin 9.1; Platelets 222; Potassium 4.7; Sodium 137   Recent Lipid Panel Lab Results  Component Value Date/Time   CHOL 121 03/31/2018 03:46 PM   TRIG 100 03/31/2018 03:46 PM   HDL 44  03/31/2018 03:46 PM   CHOLHDL 2.8 03/31/2018 03:46 PM   CHOLHDL 5 03/20/2015 10:01 AM   LDLCALC 57 03/31/2018 03:46 PM   LDLDIRECT 93.9 02/03/2013 09:33 AM    Wt Readings from Last 3 Encounters:  10/11/18 153 lb (69.4 kg)  03/31/18 156 lb 6.4 oz (70.9 kg)  03/29/18 156 lb (70.8 kg)     Objective:    Vital Signs:  Ht 5' 7.5" (1.715 m)   Wt 153 lb (69.4 kg)   BMI 23.61 kg/m    VITAL SIGNS:  reviewed RESPIRATORY:  normal respiratory effort, symmetric expansion CARDIOVASCULAR:  lower extremity edema noted-mild  ASSESSMENT & PLAN:    CAD status post rotational atherectomy and DES of the ostial and mid RCA [facility] 08/2017-no angina  Moderate aortic stenosis echo 02/2018 LV function mean gradient 20 mmHg, AVA 1.0 cm DVI 0.21, Echo 04/2018 [facility]  showed mean gradient 20 mmHg valve area 1.14 cm, normal LV function-denies chest pain or syncope.will arrange for f/u echo in August & f/u with Dr. [name]  Chronic atrial fibrillation controlled with metoprolol.  GI bleed on Coumadin and not on NOAC due to ESRD-heart rate controlled  Essential hypertension-normal but drops during dialysis treated with midodrine  Hyperlipidemia LDL 57 and triglycerides 110/10/19   COVID-19 Education: The signs and symptoms of COVID-19 were discussed with the patient and how to seek care for testing (follow up with PCP or arrange E-visit).   The importance of social distancing was discussed today.  Time:   Today, I have spent 16:20 minutes with the patient with telehealth technology discussing the above problems.     Medication Adjustments/Labs and Tests Ordered: Current medicines are reviewed at length with the patient today.  Concerns regarding medicines are outlined above.   Tests Ordered: No orders of the defined types were placed in this encounter.   Medication Changes: No orders of the defined types were placed in this encounter.   Disposition:  Follow up in 4 month(s) with  Dr. Blythe Stanford in [place] most of the year but will be here in July/early August  Signed, Ermalinda Barrios, PA-C  10/11/2018 11:22 AM    [place]

## 2018-10-11 ENCOUNTER — Other Ambulatory Visit: Payer: Self-pay

## 2018-10-11 ENCOUNTER — Telehealth (INDEPENDENT_AMBULATORY_CARE_PROVIDER_SITE_OTHER): Payer: Medicare PPO | Admitting: Physician Assistant

## 2018-10-11 ENCOUNTER — Encounter: Payer: Self-pay | Admitting: Physician Assistant

## 2018-10-11 VITALS — BP 133/60 | HR 93 | Ht 67.5 in | Wt 153.0 lb

## 2018-10-11 DIAGNOSIS — I350 Nonrheumatic aortic (valve) stenosis: Secondary | ICD-10-CM

## 2018-10-11 DIAGNOSIS — M25552 Pain in left hip: Secondary | ICD-10-CM | POA: Diagnosis not present

## 2018-10-11 NOTE — Patient Instructions (Signed)
Medication Instructions:  Your physician recommends that you continue on your current medications as directed. Please refer to the Current Medication list given to you today.  If you need a refill on your cardiac medications before your next appointment, please call your pharmacy.   Lab work: None Ordered  If you have labs (blood work) drawn today and your tests are completely normal, you will receive your results only by: [name] MyChart Message (if you have MyChart) OR . A paper copy in the mail If you have any lab test that is abnormal or we need to change your treatment, we will call you to review the results.  Testing/Procedures: Your physician has requested that you have an echocardiogram on 02/16/19 at 11:30 AM. Arrive at 11:00 AM.. Echocardiography is a painless test that uses sound waves to create images of your heart. It provides your doctor with information about the size and shape of your heart and how well your heart's chambers and valves are working. This procedure takes approximately one hour. There are no restrictions for this procedure.  Follow-Up: . Follow up with Dr. [name] on 02/16/19 at 1:40 PM  Any Other Special Instructions Will Be Listed Below (If Applicable).

## 2018-10-12 DIAGNOSIS — N186 End stage renal disease: Secondary | ICD-10-CM | POA: Diagnosis not present

## 2018-10-12 DIAGNOSIS — Z992 Dependence on renal dialysis: Secondary | ICD-10-CM | POA: Diagnosis not present

## 2018-10-14 DIAGNOSIS — Z992 Dependence on renal dialysis: Secondary | ICD-10-CM | POA: Diagnosis not present

## 2018-10-14 DIAGNOSIS — N186 End stage renal disease: Secondary | ICD-10-CM | POA: Diagnosis not present

## 2018-10-17 DIAGNOSIS — N186 End stage renal disease: Secondary | ICD-10-CM | POA: Diagnosis not present

## 2018-10-17 DIAGNOSIS — Z992 Dependence on renal dialysis: Secondary | ICD-10-CM | POA: Diagnosis not present

## 2018-10-19 DIAGNOSIS — Z992 Dependence on renal dialysis: Secondary | ICD-10-CM | POA: Diagnosis not present

## 2018-10-19 DIAGNOSIS — N186 End stage renal disease: Secondary | ICD-10-CM | POA: Diagnosis not present

## 2018-10-21 DIAGNOSIS — Z992 Dependence on renal dialysis: Secondary | ICD-10-CM | POA: Diagnosis not present

## 2018-10-21 DIAGNOSIS — Z23 Encounter for immunization: Secondary | ICD-10-CM | POA: Diagnosis not present

## 2018-10-21 DIAGNOSIS — N186 End stage renal disease: Secondary | ICD-10-CM | POA: Diagnosis not present

## 2018-10-24 DIAGNOSIS — Z23 Encounter for immunization: Secondary | ICD-10-CM | POA: Diagnosis not present

## 2018-10-24 DIAGNOSIS — Z992 Dependence on renal dialysis: Secondary | ICD-10-CM | POA: Diagnosis not present

## 2018-10-24 DIAGNOSIS — N186 End stage renal disease: Secondary | ICD-10-CM | POA: Diagnosis not present

## 2018-10-26 DIAGNOSIS — Z23 Encounter for immunization: Secondary | ICD-10-CM | POA: Diagnosis not present

## 2018-10-26 DIAGNOSIS — N186 End stage renal disease: Secondary | ICD-10-CM | POA: Diagnosis not present

## 2018-10-26 DIAGNOSIS — Z992 Dependence on renal dialysis: Secondary | ICD-10-CM | POA: Diagnosis not present

## 2018-10-28 DIAGNOSIS — Z992 Dependence on renal dialysis: Secondary | ICD-10-CM | POA: Diagnosis not present

## 2018-10-28 DIAGNOSIS — Z23 Encounter for immunization: Secondary | ICD-10-CM | POA: Diagnosis not present

## 2018-10-28 DIAGNOSIS — N186 End stage renal disease: Secondary | ICD-10-CM | POA: Diagnosis not present

## 2018-10-31 DIAGNOSIS — Z23 Encounter for immunization: Secondary | ICD-10-CM | POA: Diagnosis not present

## 2018-10-31 DIAGNOSIS — Z992 Dependence on renal dialysis: Secondary | ICD-10-CM | POA: Diagnosis not present

## 2018-10-31 DIAGNOSIS — N186 End stage renal disease: Secondary | ICD-10-CM | POA: Diagnosis not present

## 2018-11-02 DIAGNOSIS — Z23 Encounter for immunization: Secondary | ICD-10-CM | POA: Diagnosis not present

## 2018-11-02 DIAGNOSIS — Z992 Dependence on renal dialysis: Secondary | ICD-10-CM | POA: Diagnosis not present

## 2018-11-02 DIAGNOSIS — N186 End stage renal disease: Secondary | ICD-10-CM | POA: Diagnosis not present

## 2018-11-04 DIAGNOSIS — Z992 Dependence on renal dialysis: Secondary | ICD-10-CM | POA: Diagnosis not present

## 2018-11-04 DIAGNOSIS — N186 End stage renal disease: Secondary | ICD-10-CM | POA: Diagnosis not present

## 2018-11-04 DIAGNOSIS — Z23 Encounter for immunization: Secondary | ICD-10-CM | POA: Diagnosis not present

## 2018-11-07 DIAGNOSIS — Z23 Encounter for immunization: Secondary | ICD-10-CM | POA: Diagnosis not present

## 2018-11-07 DIAGNOSIS — N186 End stage renal disease: Secondary | ICD-10-CM | POA: Diagnosis not present

## 2018-11-07 DIAGNOSIS — Z992 Dependence on renal dialysis: Secondary | ICD-10-CM | POA: Diagnosis not present

## 2018-11-08 ENCOUNTER — Telehealth: Payer: Self-pay | Admitting: *Deleted

## 2018-11-08 ENCOUNTER — Other Ambulatory Visit: Payer: Self-pay | Admitting: *Deleted

## 2018-11-08 DIAGNOSIS — I739 Peripheral vascular disease, unspecified: Secondary | ICD-10-CM

## 2018-11-08 NOTE — Telephone Encounter (Signed)
Message left for the office over night. " toes pale, poor cap refill and "almost no pulse" according to staff at dialysis center. Will arrange appointment with Dr. Donnetta Hutching after ABI's as soon as possible to report to ER for any acute or worsening condition. Scheduling to call patient with appointment time.

## 2018-11-08 NOTE — Telephone Encounter (Signed)
Call from patient's wife . She states patient is c/o pain in right leg after walking short distance and "feels like my left leg when I  needed surgery". At this time she is unable to tell me if there is any change in color, temperature or sensation of right leg or any swelling. I have instructed her to check and should check both feet daily due to history. She is going to have the staff at the [facility] check it out and call me back with results. They live in [place] and do not have a PCP there and have not seen anyone about this issue.

## 2018-11-09 DIAGNOSIS — Z23 Encounter for immunization: Secondary | ICD-10-CM | POA: Diagnosis not present

## 2018-11-09 DIAGNOSIS — Z992 Dependence on renal dialysis: Secondary | ICD-10-CM | POA: Diagnosis not present

## 2018-11-09 DIAGNOSIS — N186 End stage renal disease: Secondary | ICD-10-CM | POA: Diagnosis not present

## 2018-11-11 DIAGNOSIS — N186 End stage renal disease: Secondary | ICD-10-CM | POA: Diagnosis not present

## 2018-11-11 DIAGNOSIS — Z992 Dependence on renal dialysis: Secondary | ICD-10-CM | POA: Diagnosis not present

## 2018-11-11 DIAGNOSIS — Z23 Encounter for immunization: Secondary | ICD-10-CM | POA: Diagnosis not present

## 2018-11-14 DIAGNOSIS — Z23 Encounter for immunization: Secondary | ICD-10-CM | POA: Diagnosis not present

## 2018-11-14 DIAGNOSIS — N186 End stage renal disease: Secondary | ICD-10-CM | POA: Diagnosis not present

## 2018-11-14 DIAGNOSIS — Z992 Dependence on renal dialysis: Secondary | ICD-10-CM | POA: Diagnosis not present

## 2018-11-15 ENCOUNTER — Other Ambulatory Visit: Payer: Self-pay

## 2018-11-15 ENCOUNTER — Ambulatory Visit (INDEPENDENT_AMBULATORY_CARE_PROVIDER_SITE_OTHER): Payer: Medicare PPO | Admitting: Vascular Surgery

## 2018-11-15 ENCOUNTER — Ambulatory Visit (HOSPITAL_COMMUNITY)
Admission: RE | Admit: 2018-11-15 | Discharge: 2018-11-15 | Disposition: A | Discharge status: Home / Self Care | Payer: Medicare PPO | Source: Ambulatory Visit | Attending: Vascular Surgery | Admitting: Vascular Surgery

## 2018-11-15 ENCOUNTER — Encounter: Payer: Self-pay | Admitting: Vascular Surgery

## 2018-11-15 VITALS — BP 117/63 | HR 90 | Temp 98.2°F | Resp 20 | Ht 67.5 in | Wt 152.4 lb

## 2018-11-15 DIAGNOSIS — I739 Peripheral vascular disease, unspecified: Secondary | ICD-10-CM | POA: Diagnosis not present

## 2018-11-15 NOTE — Progress Notes (Signed)
[facility]ist [facility] [name]  Patient name: Terry [name]. MRN: [id number] DOB: 11-17-1934 Sex: male  REASON FOR VISIT: Evaluation of swelling and discomfort in his right calf  HPI: Terry [name]. is a 84 y.o. male here today for concern regarding symptoms in his right leg.  He is well-known to me from prior left femoral endarterectomy and patch left leg and July 2019.  He had presented with critical limb ischemia and extensive tissue loss in his left leg and underwent endarterectomy.  He had complete resolution of his ischemia and healing of his left leg.  He presents with concern regarding his right leg.  He has had swelling in his calf and discomfort in his calf.  This is not claudication.  Not related to walking.  Is does not have any rest pain in his foot.  He does have a history of renal failure.  Past Medical History:  Diagnosis Date  . Acute and chronic respiratory failure 10/22/2012  . Anxiety   . Arthritis    "hands, knees" (03/15/2017)  . Atrial fibrillation ([place])   . CAD (coronary artery disease) [place], [name]  . CHF (congestive heart failure) ([place]) 09/2012  . COPD (chronic obstructive pulmonary disease) ([place])   . ESRD (end stage renal disease) on dialysis [facility])    "MWF; [facility], [place]" (03/15/2017)  . Flu 09/24/2016   influenza b  . HCAP (healthcare-associated pneumonia) 03/15/2017  . High cholesterol   . History of blood transfusion 2012   "related to bowel resection"  . Hypertension   . On home oxygen therapy    "1L typically at night" (03/15/2017)  . Osteoarthritis 10/22/2012  . Pneumonia April 2014; 09/2016  . Shortness of breath dyspnea   . Stroke [facility]) 2012   denies residual on 03/15/2017  . Stroke [facility]) ~ 2013   "small one in his right eye"    Family History  Problem Relation Age of Onset  . Cancer Mother        Breast  . Heart disease Father   . Heart attack Father   . Parkinson's  disease Brother     SOCIAL HISTORY: Social History   Tobacco Use  . Smoking status: Former Smoker    Packs/day: 1.00    Years: 49.00    Pack years: 49.00    Types: Cigarettes, Pipe, Cigars    Last attempt to quit: 02/21/2000    Years since quitting: 18.7  . Smokeless tobacco: Former Systems developer    Types: Chew  . Tobacco comment: "chewed when he was a kid"  Substance Use Topics  . Alcohol use: Yes    Comment: 03/15/2017 "nothing since the 1960's"    Allergies  Allergen Reactions  . Yellow Dyes (Non-Tartrazine) Other (See Comments)    Burns arms; Cough medications (tussionex and benzonatate ok) specific - yellow dye used for ophthalmology procedure   . Levaquin [Levofloxacin In D5w] Itching  . Levofloxacin Nausea And Vomiting    Current Outpatient Medications  Medication Sig Dispense Refill  . acetaminophen (TYLENOL) 500 MG tablet Take 1,000 mg by mouth every 6 (six) hours as needed for mild pain or headache.     . albuterol (PROVENTIL HFA;VENTOLIN HFA) 108 (90 Base) MCG/ACT inhaler Inhale 2 puffs into the lungs every 4 (four) hours as needed for wheezing or shortness of breath. 1 Inhaler 0  . aspirin EC 81 MG tablet Take 81 mg by mouth daily.    [name]  atorvastatin (LIPITOR) 80 MG tablet Take 1 tablet (80 mg total) by mouth daily. 90 tablet 3  . calcium acetate (PHOSLO) 667 MG capsule Take 667-1,334 mg by mouth See admin instructions. Take 1334 mg by mouth 3 times daily with each meal and take 667 mg by mouth with each snack    . cetirizine (ZYRTEC) 10 MG tablet Take 10 mg by mouth daily as needed for allergies.     [name] clopidogrel (PLAVIX) 75 MG tablet Take 1 tablet (75 mg total) by mouth daily. 90 tablet 3  . dextromethorphan-guaiFENesin (MUCINEX DM) 30-600 MG 12hr tablet Take 1 tablet by mouth 2 (two) times daily.     [name] ethyl chloride spray Apply 1 application topically every Monday, Wednesday, and Friday.     . metoprolol succinate (TOPROL-XL) 50 MG 24 hr tablet Take 1 tablet (50 mg  total) by mouth 2 (two) times daily. Take with or immediately following a meal. 180 tablet 3  . midodrine (PROAMATINE) 10 MG tablet Take 10 mg by mouth See admin instructions. Take 10 mg by mouth on Mon/Wed/Fri at dialysis as needed for low blood pressure    . OXYGEN Inhale 1 L into the lungs daily as needed (for shortness of breath).     [name] oxymetazoline (AFRIN) 0.05 % nasal spray Place 1 spray into both nostrils once as needed (to stop nose bleeds).     . predniSONE (DELTASONE) 10 MG tablet Take 1 tablet (10 mg total) by mouth daily with breakfast. 20 tablet 0  . SYMBICORT 160-4.5 MCG/ACT inhaler Inhale 2 puffs by mouth twice daily 11 g 3   No current facility-administered medications for this visit.     REVIEW OF SYSTEMS:  [X]  denotes positive finding, [ ]  denotes negative finding Cardiac  Comments:  Chest pain or chest pressure:    Shortness of breath upon exertion: x   Short of breath when lying flat: x   Irregular heart rhythm: x       Vascular    Pain in calf, thigh, or hip brought on by ambulation: x   Pain in feet at night that wakes you up from your sleep:     Blood clot in your veins:    Leg swelling:  x         PHYSICAL EXAM: Vitals:   11/15/18 1445  BP: 117/63  Pulse: 90  Resp: 20  Temp: 98.2 F (36.8 C)  SpO2: 95%  Weight: 152 lb 6.4 oz (69.1 kg)  Height: 5' 7.5" (1.715 m)    GENERAL: The patient is a well-nourished male, in no acute distress. The vital signs are documented above. CARDIOVASCULAR: Palpable 1+ right femoral and 2+ left femoral pulse.  Absent popliteal and pedal pulses bilaterally.  Pitting edema bilaterally.  No lower extremity tissue loss PULMONARY: There is good air exchange  MUSCULOSKELETAL: There are no major deformities or cyanosis. NEUROLOGIC: No focal weakness or paresthesias are detected. SKIN: There are no ulcers or rashes noted. PSYCHIATRIC: The patient has a normal affect.  DATA:  Noninvasive vascular laboratory studies today  reveal the location of distal vessels and monophasic flow bilaterally.  MEDICAL ISSUES: Had long discussion with the patient and also with his wife by telephone due to [place] visitor restrictions.  I explained that he does not have any evidence of critical limb ischemia on the right as he did on the left leg.  I reassured him that his swelling most likely is related to volume overload.  He does  not appear to be in congestive heart failure.  He does not have symptoms of rest pain in his foot.  He will continue to keep a close eye on his foot notify should he develop any tissue loss.  Otherwise we will see [place] on an as-needed basis    Rosetta Posner, MD [facility] Tel [phone] Pager [phone]

## 2018-11-16 ENCOUNTER — Other Ambulatory Visit: Payer: Self-pay | Admitting: Cardiovascular Disease

## 2018-11-16 DIAGNOSIS — Z992 Dependence on renal dialysis: Secondary | ICD-10-CM | POA: Diagnosis not present

## 2018-11-16 DIAGNOSIS — Z23 Encounter for immunization: Secondary | ICD-10-CM | POA: Diagnosis not present

## 2018-11-16 DIAGNOSIS — N186 End stage renal disease: Secondary | ICD-10-CM | POA: Diagnosis not present

## 2018-11-18 DIAGNOSIS — Z992 Dependence on renal dialysis: Secondary | ICD-10-CM | POA: Diagnosis not present

## 2018-11-18 DIAGNOSIS — N186 End stage renal disease: Secondary | ICD-10-CM | POA: Diagnosis not present

## 2018-11-18 DIAGNOSIS — Z23 Encounter for immunization: Secondary | ICD-10-CM | POA: Diagnosis not present

## 2018-11-21 DIAGNOSIS — Z992 Dependence on renal dialysis: Secondary | ICD-10-CM | POA: Diagnosis not present

## 2018-11-21 DIAGNOSIS — N186 End stage renal disease: Secondary | ICD-10-CM | POA: Diagnosis not present

## 2018-11-23 DIAGNOSIS — N186 End stage renal disease: Secondary | ICD-10-CM | POA: Diagnosis not present

## 2018-11-23 DIAGNOSIS — Z992 Dependence on renal dialysis: Secondary | ICD-10-CM | POA: Diagnosis not present

## 2018-11-24 ENCOUNTER — Telehealth: Payer: Self-pay | Admitting: Cardiovascular Disease

## 2018-11-24 DIAGNOSIS — M1711 Unilateral primary osteoarthritis, right knee: Secondary | ICD-10-CM | POA: Diagnosis not present

## 2018-11-24 DIAGNOSIS — M25561 Pain in right knee: Secondary | ICD-10-CM | POA: Diagnosis not present

## 2018-11-24 NOTE — Telephone Encounter (Signed)
° ° °  Pt c/o BP issue: STAT if pt c/o blurred vision, one-sided weakness or slurred speech  1. What are your last 5 BP readings? Low BP on 6/3 at dialysis, patient did not have exact reading  2. Are you having any other symptoms (ex. Dizziness, headache, blurred vision, passed out)? no  3. What is your BP issue? Concerns about low BP

## 2018-11-24 NOTE — Telephone Encounter (Signed)
Follow up     Pt is returning call with BP reading  132/60 hr 94

## 2018-11-24 NOTE — Telephone Encounter (Signed)
The patient is calling because Monday and Wednesday while at Dialysis, his BP was low.  He can't recall the values, but remembers 95/? Before Dialysis and 70/? During.  He then took Midodrine 20 mg and it came up to 115/?.  He will check it today at 3:30 and inform us.

## 2018-11-24 NOTE — Telephone Encounter (Signed)
Last seen with Ermalinda Barrios PA.

## 2018-11-24 NOTE — Telephone Encounter (Signed)
Patient is calling with updated BP.  He will try taking his second dose of Metoprolol earlier in the evening and see results.

## 2018-11-25 DIAGNOSIS — Z992 Dependence on renal dialysis: Secondary | ICD-10-CM | POA: Diagnosis not present

## 2018-11-25 DIAGNOSIS — N186 End stage renal disease: Secondary | ICD-10-CM | POA: Diagnosis not present

## 2018-11-28 DIAGNOSIS — Z992 Dependence on renal dialysis: Secondary | ICD-10-CM | POA: Diagnosis not present

## 2018-11-28 DIAGNOSIS — N186 End stage renal disease: Secondary | ICD-10-CM | POA: Diagnosis not present

## 2018-11-30 DIAGNOSIS — N186 End stage renal disease: Secondary | ICD-10-CM | POA: Diagnosis not present

## 2018-11-30 DIAGNOSIS — Z992 Dependence on renal dialysis: Secondary | ICD-10-CM | POA: Diagnosis not present

## 2018-12-02 DIAGNOSIS — Z992 Dependence on renal dialysis: Secondary | ICD-10-CM | POA: Diagnosis not present

## 2018-12-02 DIAGNOSIS — N186 End stage renal disease: Secondary | ICD-10-CM | POA: Diagnosis not present

## 2018-12-04 LAB — PULMONARY FUNCTION TEST
DL/VA % PRED: 48 %
DL/VA: 2.13 ml/min/mmHg/L
DLCO UNC: 11.1 ml/min/mmHg
DLCO unc % pred: 39 %
FEF 25-75 PRE: 0.52 L/s
FEF 25-75 Post: 0.39 L/sec
FEF2575-%CHANGE-POST: -24 %
FEF2575-%PRED-PRE: 27 %
FEF2575-%Pred-Post: 20 %
FEV1-%Change-Post: -5 %
FEV1-%PRED-POST: 45 %
FEV1-%Pred-Pre: 48 %
FEV1-PRE: 1.28 L
FEV1-Post: 1.21 L
FEV1FVC-%CHANGE-POST: 0 %
FEV1FVC-%Pred-Pre: 66 %
FEV6-%CHANGE-POST: -5 %
FEV6-%PRED-POST: 68 %
FEV6-%Pred-Pre: 72 %
FEV6-Post: 2.36 L
FEV6-Pre: 2.5 L
FEV6FVC-%CHANGE-POST: 0 %
FEV6FVC-%PRED-PRE: 102 %
FEV6FVC-%Pred-Post: 102 %
FVC-%CHANGE-POST: -6 %
FVC-%Pred-Post: 67 %
FVC-%Pred-Pre: 71 %
FVC-Post: 2.5 L
POST FEV1/FVC RATIO: 48 %
POST FEV6/FVC RATIO: 95 %
PRE FEV1/FVC RATIO: 48 %
Pre FEV6/FVC Ratio: 95 %
RV % PRED: 176 %
RV: 4.24 L
TLC % pred: 86 %
TLC: 5.55 L

## 2018-12-05 DIAGNOSIS — N186 End stage renal disease: Secondary | ICD-10-CM | POA: Diagnosis not present

## 2018-12-05 DIAGNOSIS — Z992 Dependence on renal dialysis: Secondary | ICD-10-CM | POA: Diagnosis not present

## 2018-12-06 ENCOUNTER — Ambulatory Visit: Payer: Medicare PPO | Admitting: Internal Medicine

## 2018-12-06 ENCOUNTER — Encounter: Payer: Self-pay | Admitting: Internal Medicine

## 2018-12-06 ENCOUNTER — Other Ambulatory Visit: Payer: Self-pay

## 2018-12-06 DIAGNOSIS — J449 Chronic obstructive pulmonary disease, unspecified: Secondary | ICD-10-CM | POA: Diagnosis not present

## 2018-12-06 DIAGNOSIS — J9611 Chronic respiratory failure with hypoxia: Secondary | ICD-10-CM

## 2018-12-06 MED ORDER — PREDNISONE 10 MG PO TABS: 10.0000 mg | ORAL_TABLET | Freq: Every day | ORAL | 0 refills | Status: DC

## 2018-12-06 NOTE — Progress Notes (Signed)
Subjective:   Patient ID: Terry [name], male    DOB: Jan 22, 1935    MRN: [id number]   Brief patient profile:  70 yowm quit smoking 2001 bothered by doe seemed better then worse in 2010 p "lung infection" and stayed on advair/spiriva  Combo since 2012 seemed better then admitted in [facility] with pna 10/09/12  in then transferred to [facility] x 3 weeks and discharged  On May 1st  2014 referred to pulmonary clinic 11/29/12  by [facility] for copd eval and proved to have GOLD III copd 01/26/13 with symptoms much better off ACEi.     History of Present Illness 08/27/2015  Extended post hosp /transition of care  f/u ov/Terry [name] re: COPD GOLD III recurrent admits/ now chronic resp failure/HD dep CRI plus GOLD III rx symb/spriiva/neb saba Chief Complaint  Patient presents with  . Follow-up    Pt c/o occasional cough with white mucus. Pt denies wheeze/increased SOB/CP/tightness. Pt is on O2/2L, he states that on 3L it dries out his nose and burns. Pt also c/o low BP readings and wonders if some of his HTN meds need to be changed.   now sleeping in a recliner since admit 30-45 degrees due to sob/some noct cough but excess/ purulent sputum or mucus plugs   On prednisone with floor of   @ 10 mg since d/c 08/19/15  rec May need to consider adding Oracit to your regimen and I will le Terry [name] know You will need to adjust your 02 to saturations of over 90%  Best fit and humidified 02 ordered  Plan A = Automatic = Symbicort 160 2 in am and 2 in pm with spiriva each am and prednisone 10 mg with breakfast daily  Work on inhaler technique:  Plan B = Backup Only use your albuterol (proventil) as a rescue medication Plan C = Crisis - only use your albuterol nebulizer if you first try Plan B and it fails to help > ok to use the nebulizer up to every 4 hours but if start needing it regularly call for immediate appointment    01/07/2016  f/u ov/Terry [name] re:  Copd GOLD III/ rx 5 prednisone / symb/spiriva and rare saba   (maybe once a week)  02 hs only  Chief Complaint  Patient presents with  . Follow-up    pt states breathing is baseline since last OV. c/o sob & non prod cough.  02 1lpm hs rarely during the day / c/o dry mouth ? From spiriva dpi Doe = MMRC3 = can't walk 100 yards even at a slow pace at a flat grade s stopping due to sob   rec Plan A = Automatic = symbicort 160 Take 2 puffs first thing in am and then another 2 puffs about 12 hours later and spiriva 1.25 x 4 or 2.5 x 2puffs in am only  Plan B = Backup Only use your albuterol (ventolin) as a rescue medication  Plan C = Crisis - only use your albuterol nebulizer if you first try Plan B and it fails to help > ok to use the nebulizer up to every 4 hours but if start needing it regularly call for immediate appointment Prednisone 5 mg daily  Please schedule a follow up visit in 3 months but call sooner if needed     09/29/16 NP rec Begin Tudorza 1 puff Twice daily , brush rinse and gargle after use. > bothered mouth  Finish Tamiflu and Cipro .  Taper Prednisone  as directed to 10mg  1/2 daily .  Follow med calendar closely and bring to each visit.     07/20/2017  f/u ov/Terry [name] re:  Transition of care Copd/ 02 1-2lpm  - no med cal Chief Complaint  Patient presents with  . Follow-up    Was at [facility] 1/25-/127/19 for copd exacerbation. Given steroids and abx. Mildly feeling better.   Dyspnea: across the room on 02 Cough: dry/ does not keep him up as long as takes delsym Sleep: 30 degrees in recliner and 1lpm  Saba:   Daytime only  rec Plan A = Automatic = symbicort / tudorza  Plan B = Backup Only use your albuterol as a rescue medication  Plan C = Crisis - only use your albuterol nebulizer if you first try Plan B  Plan D = Deltasone (prednisone-  Double the dose that you are on until better then taper back to a floor of 10 mg daily  See Tammy NP w/in 2 weeks with all your medications did not do     11/18/2017  f/u ov/Terry [name] re:  Copd  gold IV not able to trigger tudorza  Chief Complaint  Patient presents with  . Follow-up    Doing better has had two stents placed in heart since last visit.   Dyspnea:  rollator MMRC3 = can't walk 100 yards even at a slow pace at a flat grade s stopping due to sob  On RA can do one aisle at [facility] then has to stop  Cough: worse since started lisinopril/  still some rattling / white mucus esp in am  Sleep: ok recliner > 30 degrees  No 02  SABA use:  Once a week  No increase need off of Tudorza  At prednisone 10 mg one half daily rec No change in respiratory medications but ok to wean  Prednisone to 5 mg a/w  2.5 daily x one week and if ok then 2.5 mg daily  - if nausea or worse breathing resume the previous dose and really worse ceiling is actually 20 mg per day  Leave off tudorza and  [employer]  Take 2 puffs first thing in am and then another 2 puffs about 12 hours later.  Stop lisinopril for now and let Terry [name] team handle it For cough > delsym 2 every 12 hours as needed     03/01/2018  f/u ov/Terry [name] re: copd GOLD IV/   Prednisone 10 mg daily / symbicort 160 2bid  Chief Complaint  Patient presents with  . Follow-up  Dyspnea:  No change = MMRC3 = can't walk 100 yards even at a slow pace at a flat grade s stopping due to sob  / no change before or after HD Cough: better  Sleeping: 30-45 degrees  SABA use: rarely needing while maint on symb  02:  Only at hs x one liter per min   rec Goal for 02 is to keep sats > 90% while walking  Work on inhaler technique:    Prednisone 10mg  / 5 mg if tolerated     12/06/2018  f/u ov/Terry [name] re:  GOLD IV / symbicort 160 2bid / prednisone 10 mg dialy  Chief Complaint  Patient presents with  . Follow-up    F/U COPD no change since last visit DME-[facility] O2@1L  at night   Dyspnea:  50-100 ft not checking 02 consistently  Cough: none lately  Sleeping: recliner 45 degrees lower bothers breathing  SABA use:  up to twice daily 02:  none at rest / 1lpm hs/  Tends to be lower 90s RA walking    No obvious day to day or daytime variability or assoc excess/ purulent sputum or mucus plugs or hemoptysis or cp or chest tightness, subjective wheeze or overt sinus or hb symptoms.   Sleeping as above without nocturnal  or early am exacerbation  of respiratory  c/o's or need for noct saba. Also denies any obvious fluctuation of symptoms with weather or environmental changes or other aggravating or alleviating factors except as outlined above   No unusual exposure hx or h/o childhood pna/ asthma or knowledge of premature birth.  Current Allergies, Complete Past Medical History, Past Surgical History, Family History, and Social History were reviewed in [employer] record.  ROS  The following are not active complaints unless bolded Hoarseness, sore throat, dysphagia, dental problems, itching, sneezing,  nasal congestion or discharge of excess mucus or purulent secretions, ear ache,   fever, chills, sweats, unintended wt loss or wt gain, classically pleuritic or exertional cp,  orthopnea pnd or arm/hand swelling  or leg swelling, presyncope, palpitations, abdominal pain, anorexia, nausea, vomiting, diarrhea  or change in bowel habits or change in bladder habits, change in stools or change in urine, dysuria, hematuria,  rash, arthralgias, visual complaints, headache, numbness, weakness or ataxia or problems with walking so uses rollator or coordination,  change in mood or  memory.        Not clear what meds he's really taking at time of ov/ did not bring them as req                          Objective:   Physical Exam  amb wm with rollator    02/26/2015          210 >  08/27/2015 163 p HD > 09/26/2015  159 > 11/26/2015  160 > 01/07/2016  159 > 04/07/2016  159 > 05/06/2016>   07/21/2016  157 > 08/13/2016  152  > 12/10/2016  150 > 02/09/2017  152 > 03/09/2017  156 > 04/20/2017  152 > 05/20/2017  152 >    07/20/2017 156 >  03/01/2018  150 > 12/06/2018  152   Vital signs reviewed - Note on arrival 02 sats  95% on RA RA       HEENT: Edentulous with nl  oropharynx. Nl external ear canals without cough reflex -  Mild bilateral non-specific turbinate edema     NECK :  without JVD/Nodes/TM/ nl carotid upstrokes bilaterally   LUNGS: no acc muscle use,  Mod barrel  contour chest wall with bilateral  Distant bs s audible wheeze and  without cough on insp or exp maneuver and mod  Hyperresonant  to  percussion bilaterally     CV:  RRR  no s3 or murmur or increase in P2, and no edema   ABD:  soft and nontender with pos mid insp Hoover's  in the supine position. No bruits or organomegaly appreciated, bowel sounds nl  MS:   Nl gait/  ext warm without deformities, calf tenderness, cyanosis or clubbing No obvious joint restrictions   SKIN: warm and dry without lesions    NEURO:  alert, approp, nl sensorium with  no motor or cerebellar deficits apparent.

## 2018-12-06 NOTE — Patient Instructions (Addendum)
Goal is to keep 02  above 90%   Prednisone 10 mg x 2 until better then 10 mg daily x one week then 10 /5 is floor   Please schedule a follow up visit in 6 months but call sooner if needed

## 2018-12-07 DIAGNOSIS — N186 End stage renal disease: Secondary | ICD-10-CM | POA: Diagnosis not present

## 2018-12-07 DIAGNOSIS — Z992 Dependence on renal dialysis: Secondary | ICD-10-CM | POA: Diagnosis not present

## 2018-12-09 ENCOUNTER — Telehealth: Payer: Self-pay | Admitting: Cardiovascular Disease

## 2018-12-09 DIAGNOSIS — Z992 Dependence on renal dialysis: Secondary | ICD-10-CM | POA: Diagnosis not present

## 2018-12-09 DIAGNOSIS — N186 End stage renal disease: Secondary | ICD-10-CM | POA: Diagnosis not present

## 2018-12-09 NOTE — Telephone Encounter (Signed)
Patient's wife called stating that his BP has dropped during diaylsis she is wondering if his needs to be on a lower dose of metoprolol succinate (TOPROL-XL) 50 MG 24 hr tablet. She stated the top number is in the 90's she said once it even went into the 70's

## 2018-12-09 NOTE — Telephone Encounter (Signed)
I spoke with pt's wife who reports pt's BP has been running low at dialysis at times.  Dialysis time was recently changed from 1 PM to 10 AM.  They have noticed if pt takes metoprolol earlier in evening prior to dialysis BP does better.  He will try and take consistently at this time.  Reading in 70's has happened a couple times at dialysis.  Most times it is in the 90's. Has prn midodrine to use when BP low at dialysis.   They do not check on a regular basis on non dialysis days but one day it was 102/42 and heart rate was 95.  I asked wife to check BP and heart rate over the next 10-14 days and call us back with readings.

## 2018-12-12 ENCOUNTER — Encounter: Payer: Self-pay | Admitting: Internal Medicine

## 2018-12-12 DIAGNOSIS — N186 End stage renal disease: Secondary | ICD-10-CM | POA: Diagnosis not present

## 2018-12-12 DIAGNOSIS — Z992 Dependence on renal dialysis: Secondary | ICD-10-CM | POA: Diagnosis not present

## 2018-12-12 NOTE — Assessment & Plan Note (Signed)
02 rx as of  hosp discharge 08/14/15   As of 12/06/2018 =  1lpm HS and prn daytime to keep sats > 90%    Adequate control on present rx, reviewed in detail with pt > no change in rx needed     I had an extended discussion with the patient reviewing all relevant studies completed to date and  lasting 15 to 20 minutes of a 25 minute visit    See device teaching which extended face to face time for this visit.  Each maintenance medication was reviewed in detail including emphasizing most importantly the difference between maintenance and prns and under what circumstances the prns are to be triggered using an action plan format that is not reflected in the computer generated alphabetically organized AVS which I have not found useful in most complex patients, especially with respiratory illnesses  Please see AVS for specific instructions unique to this visit that I personally wrote and verbalized to the the pt in detail and then reviewed with pt  by my nurse highlighting any  changes in therapy recommended at today's visit to their plan of care.

## 2018-12-12 NOTE — Assessment & Plan Note (Signed)
Quit smoking 2001  - Trial off ACEi 11/30/2012 >>> much better symptom control - 05/28/2014   Walked RA x one lap @ 185 stopped due to  [employer] / sats ok/ mod pace - PFT's 01/26/13      FEV1  1.28 (48%) ratio 48 and no better p B2,  DLCO 39 corrects to 48%  - PFTs 02/26/2015   FEV1 0.99  (35 % ) ratio 35  p 11 % improvement from saba with DLCO  24 % corrects to 35  % for alv volume  p am symb/spiriva    - Pred maint rx  Since   08/14/15  - 09/26/2015  extensive coaching HFA effectiveness =    75%   - 05/22/2016  After extensive coaching HFA effectiveness =  90%  - 07/21/2016 try off spiriva due to throat irritation > restarted at rec of wife during cough but did not help it so d/c'd again 08/13/2016 with plan to change to spriva smi if breathing worse  - 02/09/2017 added spiriva  - 04/20/2017  After extensive coaching HFA effectiveness =    90%  - PFT's  11/18/2017  FEV1 0.68 (26 % ) ratio 39  p 4 % improvement from saba p symbicort and no lama prior to study with DLCO  21 % corrects to 36  % for alv volume   - 11/18/2017  Trial of Bevespi  Preferred symb - rec refer to rehab 03/01/2018   - 12/06/2018  After extensive coaching inhaler device,  effectiveness =    90%  With hfa   The goal with a chronic steroid dependent illness is always arriving at the lowest effective dose that controls the disease/symptoms and not accepting a set "formula" which is based on statistics or guidelines that don't always take into account patient  variability or the natural hx of the dz in every individual patient, which may well vary over time.  For now therefore I recommend the patient maintain  Ceiling of 20 mg and a floor of 10/5 if tol ultimately working toward 5 mg as more of a physiologic dose/ goals discussed with pt and wife

## 2018-12-13 DIAGNOSIS — M1711 Unilateral primary osteoarthritis, right knee: Secondary | ICD-10-CM | POA: Diagnosis not present

## 2018-12-14 DIAGNOSIS — N186 End stage renal disease: Secondary | ICD-10-CM | POA: Diagnosis not present

## 2018-12-14 DIAGNOSIS — Z992 Dependence on renal dialysis: Secondary | ICD-10-CM | POA: Diagnosis not present

## 2018-12-16 ENCOUNTER — Other Ambulatory Visit: Payer: Self-pay | Admitting: Cardiovascular Disease

## 2018-12-16 DIAGNOSIS — N186 End stage renal disease: Secondary | ICD-10-CM | POA: Diagnosis not present

## 2018-12-16 DIAGNOSIS — Z992 Dependence on renal dialysis: Secondary | ICD-10-CM | POA: Diagnosis not present

## 2018-12-19 DIAGNOSIS — N186 End stage renal disease: Secondary | ICD-10-CM | POA: Diagnosis not present

## 2018-12-19 DIAGNOSIS — Z992 Dependence on renal dialysis: Secondary | ICD-10-CM | POA: Diagnosis not present

## 2018-12-21 DIAGNOSIS — Z992 Dependence on renal dialysis: Secondary | ICD-10-CM | POA: Diagnosis not present

## 2018-12-21 DIAGNOSIS — N186 End stage renal disease: Secondary | ICD-10-CM | POA: Diagnosis not present

## 2018-12-23 DIAGNOSIS — Z992 Dependence on renal dialysis: Secondary | ICD-10-CM | POA: Diagnosis not present

## 2018-12-23 DIAGNOSIS — N186 End stage renal disease: Secondary | ICD-10-CM | POA: Diagnosis not present

## 2018-12-26 DIAGNOSIS — N186 End stage renal disease: Secondary | ICD-10-CM | POA: Diagnosis not present

## 2018-12-26 DIAGNOSIS — R710 Precipitous drop in hematocrit: Secondary | ICD-10-CM | POA: Diagnosis not present

## 2018-12-26 DIAGNOSIS — Z992 Dependence on renal dialysis: Secondary | ICD-10-CM | POA: Diagnosis not present

## 2018-12-26 NOTE — Telephone Encounter (Signed)
I spoke with pt's wife. She reports on 6/30 BP was 115/39, heart rate 85.  This is a non dialysis day. 7/1-Dialysis day--Pt does not take AM toprol.   That evening BP was 100-108/42-45. Heart rate 87-90 after taking Toprol. 7/2-Non-dialysis day--115/48, 104. At 1:50 AM 7/3-Dialysis day-117/43 7/4-non-dialysis day-12 AM-118/46,86. 7/6-Dialysis day--117/50 and 136/59.  Heart rate 92,93 She will send readings from dialysis through my chart later today

## 2018-12-26 NOTE — Telephone Encounter (Signed)
I spoke with patient's wife and told her we had received my chart message. She reports they have decreased pt's evening dose of Toprol to 25 mg when he does not have dialysis the next day and they skip evening dose when he has dialysis the next day. Pt's wife reports he passed out at dialysis one day recently. Also had to be given saline a couple times.  Wife reports pt gets worn out easily. Going to the bathroom for bowel movement tires him out.  He has to rest when working out in the yard.  Some shortness of breath.  Pt does not think shortness of breath and feeling of tiredness has worsened since telemedicine visit in April. He is scheduled for echo and office visit on August 27,2020 Will send message to Dr. [name] for review/recommendations.

## 2018-12-27 NOTE — Telephone Encounter (Signed)
I will respond to them with a MyChart message.

## 2018-12-28 DIAGNOSIS — N186 End stage renal disease: Secondary | ICD-10-CM | POA: Diagnosis not present

## 2018-12-28 DIAGNOSIS — Z992 Dependence on renal dialysis: Secondary | ICD-10-CM | POA: Diagnosis not present

## 2018-12-30 DIAGNOSIS — N186 End stage renal disease: Secondary | ICD-10-CM | POA: Diagnosis not present

## 2018-12-30 DIAGNOSIS — Z992 Dependence on renal dialysis: Secondary | ICD-10-CM | POA: Diagnosis not present

## 2018-12-30 NOTE — Telephone Encounter (Signed)
My chart message has not been read I spoke with pt's wife and gave her information in my chart message. She reports dry weight at dialysis has been increased and pt is feeling better.

## 2019-01-02 DIAGNOSIS — N186 End stage renal disease: Secondary | ICD-10-CM | POA: Diagnosis not present

## 2019-01-02 DIAGNOSIS — Z992 Dependence on renal dialysis: Secondary | ICD-10-CM | POA: Diagnosis not present

## 2019-01-04 DIAGNOSIS — Z992 Dependence on renal dialysis: Secondary | ICD-10-CM | POA: Diagnosis not present

## 2019-01-04 DIAGNOSIS — N186 End stage renal disease: Secondary | ICD-10-CM | POA: Diagnosis not present

## 2019-01-05 DIAGNOSIS — K921 Melena: Secondary | ICD-10-CM | POA: Diagnosis not present

## 2019-01-05 DIAGNOSIS — K3182 Dieulafoy lesion (hemorrhagic) of stomach and duodenum: Secondary | ICD-10-CM | POA: Diagnosis not present

## 2019-01-05 DIAGNOSIS — Z86010 Personal history of colonic polyps: Secondary | ICD-10-CM | POA: Diagnosis not present

## 2019-01-05 DIAGNOSIS — Z7901 Long term (current) use of anticoagulants: Secondary | ICD-10-CM | POA: Diagnosis not present

## 2019-01-05 DIAGNOSIS — D649 Anemia, unspecified: Secondary | ICD-10-CM | POA: Diagnosis not present

## 2019-01-05 DIAGNOSIS — Z9049 Acquired absence of other specified parts of digestive tract: Secondary | ICD-10-CM | POA: Diagnosis not present

## 2019-01-05 DIAGNOSIS — I2510 Atherosclerotic heart disease of native coronary artery without angina pectoris: Secondary | ICD-10-CM | POA: Diagnosis not present

## 2019-01-05 DIAGNOSIS — Z992 Dependence on renal dialysis: Secondary | ICD-10-CM | POA: Diagnosis not present

## 2019-01-05 DIAGNOSIS — R195 Other fecal abnormalities: Secondary | ICD-10-CM | POA: Diagnosis not present

## 2019-01-05 DIAGNOSIS — I350 Nonrheumatic aortic (valve) stenosis: Secondary | ICD-10-CM | POA: Diagnosis not present

## 2019-01-05 DIAGNOSIS — I482 Chronic atrial fibrillation, unspecified: Secondary | ICD-10-CM | POA: Diagnosis not present

## 2019-01-05 DIAGNOSIS — D62 Acute posthemorrhagic anemia: Secondary | ICD-10-CM | POA: Diagnosis not present

## 2019-01-05 DIAGNOSIS — D500 Iron deficiency anemia secondary to blood loss (chronic): Secondary | ICD-10-CM | POA: Diagnosis not present

## 2019-01-05 DIAGNOSIS — E871 Hypo-osmolality and hyponatremia: Secondary | ICD-10-CM | POA: Diagnosis not present

## 2019-01-05 DIAGNOSIS — N186 End stage renal disease: Secondary | ICD-10-CM | POA: Diagnosis not present

## 2019-01-05 DIAGNOSIS — I120 Hypertensive chronic kidney disease with stage 5 chronic kidney disease or end stage renal disease: Secondary | ICD-10-CM | POA: Diagnosis not present

## 2019-01-05 DIAGNOSIS — J449 Chronic obstructive pulmonary disease, unspecified: Secondary | ICD-10-CM | POA: Diagnosis not present

## 2019-01-05 DIAGNOSIS — E785 Hyperlipidemia, unspecified: Secondary | ICD-10-CM | POA: Diagnosis not present

## 2019-01-09 DIAGNOSIS — Z992 Dependence on renal dialysis: Secondary | ICD-10-CM | POA: Diagnosis not present

## 2019-01-09 DIAGNOSIS — J449 Chronic obstructive pulmonary disease, unspecified: Secondary | ICD-10-CM | POA: Diagnosis not present

## 2019-01-09 DIAGNOSIS — N186 End stage renal disease: Secondary | ICD-10-CM | POA: Diagnosis not present

## 2019-01-09 DIAGNOSIS — L0390 Cellulitis, unspecified: Secondary | ICD-10-CM | POA: Diagnosis not present

## 2019-01-09 DIAGNOSIS — I5031 Acute diastolic (congestive) heart failure: Secondary | ICD-10-CM | POA: Diagnosis not present

## 2019-01-10 ENCOUNTER — Telehealth: Payer: Self-pay | Admitting: *Deleted

## 2019-01-10 NOTE — Telephone Encounter (Signed)
Pt aware of Dr Camillia Herter response to email./cy   2  Terry [name]. "[name]" Male, 84 y.o., 03/31/1935 MRN:  [id number] Phone:  [phone] Jerilynn Mages) PCP:  Jamal Maes, MD Primary Cvg:  [facility] Medicare/[employer] Medicare Choice Ppo Next Appt With Cardiology ([facility] Echo 1) 02/16/2019 at 11:30 AM This message is to inform you that the patient has not yet read the following message. (Notification date: January 10, 2019)  RE: Non-Urgent Medical Question  From  Burnell Blanks, MD To  [name] "[name]" Sent and Delivered  12/27/2018 12:38 PM  [name], I reviewed your phone conversation from yesterday with [name]. I agree with how you are adjusting your Toprol dose on dialysis days and non-dialysis days. I don't have any other recommendations at this time. [name]     Previous Messages  ----- Message -----    From:[name] Alene [name].    Sent:12/26/2018 4:16 PM EDT     RQ:[id number] [name], MD  Subject:Non-Urgent Medical Question   Trying to attach a report from Dialysis Center regarding blood pressure readings. How can I do that?    Audit Development worker, community User Last Read On  [name] Not Read

## 2019-01-11 DIAGNOSIS — N186 End stage renal disease: Secondary | ICD-10-CM | POA: Diagnosis not present

## 2019-01-11 DIAGNOSIS — Z992 Dependence on renal dialysis: Secondary | ICD-10-CM | POA: Diagnosis not present

## 2019-01-13 DIAGNOSIS — N186 End stage renal disease: Secondary | ICD-10-CM | POA: Diagnosis not present

## 2019-01-13 DIAGNOSIS — Z992 Dependence on renal dialysis: Secondary | ICD-10-CM | POA: Diagnosis not present

## 2019-01-16 DIAGNOSIS — N186 End stage renal disease: Secondary | ICD-10-CM | POA: Diagnosis not present

## 2019-01-16 DIAGNOSIS — Z992 Dependence on renal dialysis: Secondary | ICD-10-CM | POA: Diagnosis not present

## 2019-01-18 DIAGNOSIS — Z992 Dependence on renal dialysis: Secondary | ICD-10-CM | POA: Diagnosis not present

## 2019-01-18 DIAGNOSIS — N186 End stage renal disease: Secondary | ICD-10-CM | POA: Diagnosis not present

## 2019-01-20 DIAGNOSIS — N186 End stage renal disease: Secondary | ICD-10-CM | POA: Diagnosis not present

## 2019-01-20 DIAGNOSIS — Z992 Dependence on renal dialysis: Secondary | ICD-10-CM | POA: Diagnosis not present

## 2019-01-21 DIAGNOSIS — Z992 Dependence on renal dialysis: Secondary | ICD-10-CM | POA: Diagnosis not present

## 2019-01-21 DIAGNOSIS — N186 End stage renal disease: Secondary | ICD-10-CM | POA: Diagnosis not present

## 2019-01-22 DIAGNOSIS — N186 End stage renal disease: Secondary | ICD-10-CM | POA: Diagnosis not present

## 2019-01-22 DIAGNOSIS — Z992 Dependence on renal dialysis: Secondary | ICD-10-CM | POA: Diagnosis not present

## 2019-01-25 DIAGNOSIS — N186 End stage renal disease: Secondary | ICD-10-CM | POA: Diagnosis not present

## 2019-01-25 DIAGNOSIS — Z992 Dependence on renal dialysis: Secondary | ICD-10-CM | POA: Diagnosis not present

## 2019-01-27 DIAGNOSIS — N186 End stage renal disease: Secondary | ICD-10-CM | POA: Diagnosis not present

## 2019-01-27 DIAGNOSIS — Z992 Dependence on renal dialysis: Secondary | ICD-10-CM | POA: Diagnosis not present

## 2019-01-30 DIAGNOSIS — Z992 Dependence on renal dialysis: Secondary | ICD-10-CM | POA: Diagnosis not present

## 2019-01-30 DIAGNOSIS — N186 End stage renal disease: Secondary | ICD-10-CM | POA: Diagnosis not present

## 2019-02-01 DIAGNOSIS — N186 End stage renal disease: Secondary | ICD-10-CM | POA: Diagnosis not present

## 2019-02-01 DIAGNOSIS — Z992 Dependence on renal dialysis: Secondary | ICD-10-CM | POA: Diagnosis not present

## 2019-02-03 DIAGNOSIS — Z992 Dependence on renal dialysis: Secondary | ICD-10-CM | POA: Diagnosis not present

## 2019-02-03 DIAGNOSIS — N186 End stage renal disease: Secondary | ICD-10-CM | POA: Diagnosis not present

## 2019-02-06 DIAGNOSIS — Z992 Dependence on renal dialysis: Secondary | ICD-10-CM | POA: Diagnosis not present

## 2019-02-06 DIAGNOSIS — N186 End stage renal disease: Secondary | ICD-10-CM | POA: Diagnosis not present

## 2019-02-07 DIAGNOSIS — B351 Tinea unguium: Secondary | ICD-10-CM | POA: Diagnosis not present

## 2019-02-07 DIAGNOSIS — L603 Nail dystrophy: Secondary | ICD-10-CM | POA: Diagnosis not present

## 2019-02-08 DIAGNOSIS — Z992 Dependence on renal dialysis: Secondary | ICD-10-CM | POA: Diagnosis not present

## 2019-02-08 DIAGNOSIS — N186 End stage renal disease: Secondary | ICD-10-CM | POA: Diagnosis not present

## 2019-02-09 DIAGNOSIS — I5031 Acute diastolic (congestive) heart failure: Secondary | ICD-10-CM | POA: Diagnosis not present

## 2019-02-09 DIAGNOSIS — J449 Chronic obstructive pulmonary disease, unspecified: Secondary | ICD-10-CM | POA: Diagnosis not present

## 2019-02-09 DIAGNOSIS — L0390 Cellulitis, unspecified: Secondary | ICD-10-CM | POA: Diagnosis not present

## 2019-02-09 DIAGNOSIS — Z992 Dependence on renal dialysis: Secondary | ICD-10-CM | POA: Diagnosis not present

## 2019-02-10 DIAGNOSIS — N186 End stage renal disease: Secondary | ICD-10-CM | POA: Diagnosis not present

## 2019-02-10 DIAGNOSIS — Z992 Dependence on renal dialysis: Secondary | ICD-10-CM | POA: Diagnosis not present

## 2019-02-13 DIAGNOSIS — Z992 Dependence on renal dialysis: Secondary | ICD-10-CM | POA: Diagnosis not present

## 2019-02-13 DIAGNOSIS — N186 End stage renal disease: Secondary | ICD-10-CM | POA: Diagnosis not present

## 2019-02-15 DIAGNOSIS — Z992 Dependence on renal dialysis: Secondary | ICD-10-CM | POA: Diagnosis not present

## 2019-02-15 DIAGNOSIS — N186 End stage renal disease: Secondary | ICD-10-CM | POA: Diagnosis not present

## 2019-02-15 NOTE — H&P (View-Only) (Signed)
Chief Complaint  Patient presents with   Follow-up    CAD   History of Present Illness: 84 yo male with history of paroxysmal atrial fibrillation, HTN, HLD, prior CVA, COPD, CAD, ESRD on HD and moderate aortic stenosis here today for cardiac follow up. He had been followed by cardiology in [place], [name] for many years until he moved to [place] in 2014. Cath records received from [facility] . Cardiac cath 11/30/1997. Mild distal Left main stenosis, 25% proximal LAD, minor irregularities Circumflex, 95% mid RCA stenosis treated with 3.5 x 32 mm bare metal stent. There was a ? Overlapping 3.5 x 18 mm bare metal stent more proximally. Echo 02/03/13 with LVEF=50-55%. He had normal carotid dopplers in [place] 2013. He was admitted to [facility] October 2015 with ruptured kidney cyst. Atrial fibrillation diagnosed in December 2015 at an office visit. He was initially started on Eliquis but progressed to ESRD so this was stopped. He was started on coumadin but had GI blood loss and severe bruising so this was stopped. He has been on low dose beta blocker therapy. He had been started on amiodarone but this was stopped. Low risk stress myoview September 2016.  He was admitted to [facility] in March 2019 with SOB. He was in atrial fib and treated for pneumonia and COPD exacerbation. Cardiac cath with severe RCA disease (70% ostial stenosis, 95% mid stenosis, 50% LAD proximal stenosis, Circumflex 40% mid stenosis. His vessels were all calcified. Echo March 2019 with LVEF=55%, severe AS with AVA 0.7 cm2, mean gradient 21 mmHg, mild LVH. He was transferred to [facility] and had rotational atherectomy of the ostial and mid RCA and placement of a 3.0 x 28 mm drug eluting stent in the mid vessel and a 3.5 x 24 mm drug eluting stent in the ostium of the RCA. Echo at [facility] with aortic valve area of 1.0 cm2 He was seen by the TAVR team and it was felt that TAVR was not indicated at this time as his AS was only  moderate. Mean gradient 25 mmHg. Echo here in September 2019 with normal LV systolic function, moderately severe AS with mean gradient 20 mmHg, AVA around 1.0 cm2 and DVI 0.21. Hospitalized in November 2019 at [facility] in [place], [name] due to pneumonia and echo there showed moderate AS with mean gradient 20 mmHg and AVA 1.1 cm2. LV function was normal. He has PAD and left leg ischemia in July 2019 treated with left femoral endarterectomy, Dr. Donnetta Hutching.   He is here today for follow up. Echo today with normal LV systolic function. The aortic valve leaflets are thickened and calcified. AVA 0.53cm2. Dimensionless index 0.19. Mean gradient 29 mmHg. The patient endorses worsened dyspnea. He can barely walk across the room without having to stop to catch his breath. He denies any chest pain, palpitations, lower extremity edema, orthopnea, PND, dizziness, near syncope or syncope.    Primary Care Physician: Jamal Maes, MD  Past Medical History:  Diagnosis Date   Acute and chronic respiratory failure 10/22/2012   Anxiety    Arthritis    "hands, knees" (03/15/2017)   Atrial fibrillation (HCC)    CAD (coronary artery disease) [place]   CHF (congestive heart failure) ([place]) 09/2012   COPD (chronic obstructive pulmonary disease) ([place])    ESRD (end stage renal disease) on dialysis ([place])    "MWF; [facility] [place]" (03/15/2017)   Flu 09/24/2016   influenza b  HCAP (healthcare-associated pneumonia) 03/15/2017   High cholesterol    History of blood transfusion 2012   "related to bowel resection"   Hypertension    On home oxygen therapy    "1L typically at night" (03/15/2017)   Osteoarthritis 10/22/2012   Pneumonia April 2014; 09/2016   Shortness of breath dyspnea    Stroke [facility]) 2012   denies residual on 03/15/2017   Stroke [facility]) ~ 2013   "small one in his right eye"    Past Surgical History:  Procedure Laterality Date   ABDOMINAL AORTOGRAM N/A  12/16/2017   Procedure: ABDOMINAL AORTOGRAM;  Surgeon: Conrad [place], MD;  Location: [place] [facility];  Service: Cardiovascular;  Laterality: N/A;   APPENDECTOMY  1984   AV FISTULA PLACEMENT Right 05/21/2015   Procedure: Right Arm Brachiocephalic ARTERIOVENOUS (AV) FISTULA CREATION;  Surgeon: Angelia Mould, MD;  Location: [place];  Service: Vascular;  Laterality: Right;   CATARACT EXTRACTION W/ INTRAOCULAR LENS  IMPLANT, BILATERAL Bilateral    COLECTOMY  2012   partial colon removed - twisted bowel   CORONARY ANGIOPLASTY WITH [place]   2 stents   CYSTOSCOPY/RETROGRADE/URETEROSCOPY Bilateral 04/23/2014   Procedure: CYSTOSCOPY BILATERAL RETROGRADE,  LEFT URETEROSCOPY, LEFT STENT PLACEMENT;  Surgeon: Raynelle Bring, MD;  Location: [facility];  Service: Urology;  Laterality: Bilateral;   ENDARTERECTOMY FEMORAL Left 12/29/2017   Procedure: Left Femoral ENDARTERECTOMY with Patch Angioplasty using Xenosure patch;  Surgeon: Rosetta Posner, MD;  Location: [facility];  Service: Vascular;  Laterality: Left;   EYE SURGERY Left    "surgery to clean off lens after cataract OR"   INGUINAL HERNIA REPAIR Right    LOWER EXTREMITY ANGIOGRAPHY Bilateral 12/16/2017   Procedure: LOWER EXTREMITY ANGIOGRAPHY;  Surgeon: Conrad [place], MD;  Location: [facility] [facility];  Service: Cardiovascular;  Laterality: Bilateral;    Current Outpatient Medications  Medication Sig Dispense Refill   acetaminophen (TYLENOL) 500 MG tablet Take 1,000 mg by mouth every 6 (six) hours as needed for mild pain or headache.      albuterol (PROVENTIL HFA;VENTOLIN HFA) 108 (90 Base) MCG/ACT inhaler Inhale 2 puffs into the lungs every 4 (four) hours as needed for wheezing or shortness of breath. 1 Inhaler 0   aspirin EC 81 MG tablet Take 81 mg by mouth daily.     atorvastatin (LIPITOR) 80 MG tablet Take 1 tablet by mouth once daily 90 tablet 3   calcium acetate (PHOSLO) 667 MG capsule Take 667-1,334 mg by mouth See  admin instructions. Take 1334 mg by mouth 3 times daily with each meal and take 667 mg by mouth with each snack     cetirizine (ZYRTEC) 10 MG tablet Take 10 mg by mouth daily as needed for allergies.      clopidogrel (PLAVIX) 75 MG tablet Take 1 tablet by mouth once daily 90 tablet 2   dextromethorphan-guaiFENesin (MUCINEX DM) 30-600 MG 12hr tablet Take 1 tablet by mouth 2 (two) times daily.      ethyl chloride spray Apply 1 application topically every Monday, Wednesday, and Friday.      metoprolol succinate (TOPROL-XL) 50 MG 24 hr tablet Take 1 tablet (50 mg total) by mouth 2 (two) times daily. Take with or immediately following a meal. 180 tablet 3   midodrine (PROAMATINE) 10 MG tablet Take 10 mg by mouth See admin instructions. Take 10 mg by mouth on Mon/Wed/Fri at dialysis as needed for low blood pressure     OXYGEN  Inhale 1 L into the lungs daily as needed (for shortness of breath).      oxymetazoline (AFRIN) 0.05 % nasal spray Place 1 spray into both nostrils once as needed (to stop nose bleeds).      predniSONE (DELTASONE) 10 MG tablet Take 1 tablet (10 mg total) by mouth daily with breakfast. 100 tablet 0   SYMBICORT 160-4.5 MCG/ACT inhaler Inhale 2 puffs by mouth twice daily 11 g 3   No current facility-administered medications for this visit.     Allergies  Allergen Reactions   Yellow Dyes (Non-Tartrazine) Other (See Comments)    Burns arms; Cough medications (tussionex and benzonatate ok) specific - yellow dye used for ophthalmology procedure    Levaquin [Levofloxacin In D5w] Itching   Levofloxacin Nausea And Vomiting    Social History   Socioeconomic History   Marital status: Married    Spouse name: Not on file   Number of children: 3   Years of education: Not on file   Highest education level: Not on file  Occupational History   Occupation: retired-worked in Writer: [place] resource strain: Not on file   Food  insecurity    Worry: Not on file    Inability: Not on file   Transportation needs    Medical: Not on file    Non-medical: Not on file  Tobacco Use   Smoking status: Former Smoker    Packs/day: 1.00    Years: 49.00    Pack years: 49.00    Types: Cigarettes, Pipe, Cigars    Quit date: 02/21/2000    Years since quitting: 19.0   Smokeless tobacco: Former Systems developer    Types: Chew   Tobacco comment: "chewed when he was a kid"  Substance and Sexual Activity   Alcohol use: Yes    Comment: 03/15/2017 "nothing since the 1960's"   Drug use: No   Sexual activity: Never  Lifestyle   Physical activity    Days per week: Not on file    Minutes per session: Not on file   Stress: Not on file  Relationships   Social connections    Talks on phone: Not on file    Gets together: Not on file    Attends religious service: Not on file    Active member of club or organization: Not on file    Attends meetings of clubs or organizations: Not on file    Relationship status: Not on file   Intimate partner violence    Fear of current or ex partner: Not on file    Emotionally abused: Not on file    Physically abused: Not on file    Forced sexual activity: Not on file  Other Topics Concern   Not on file  Social History Narrative   Work or School: retired [employer] - [employer]      Home Situation: lives with wife in [place] Beliefs: [place] regular; poor diet          Family History  Problem Relation Age of Onset   Cancer Mother        Breast   Heart disease Father    Heart attack Father    Parkinson's disease Brother     Review of Systems:  As stated in the HPI and otherwise negative.   BP 130/60    Pulse (!) 106    Ht 5'  7" (1.702 m)    Wt 156 lb (70.8 kg)    SpO2 97%    BMI 24.43 kg/m   Physical Examination:  General: Well developed, well nourished, NAD  HEENT: OP clear, mucus membranes moist  SKIN: warm, dry. No rashes. Neuro: No focal  deficits  Musculoskeletal: Muscle strength 5/5 all ext  Psychiatric: Mood and affect normal  Neck: No JVD, no carotid bruits, no thyromegaly, no lymphadenopathy.  Lungs:Clear bilaterally, no wheezes, rhonci, crackles Cardiovascular: Regular rate and rhythm. Systolic murmur.  Abdomen:Soft. Bowel sounds present. Non-tender.  Extremities: No lower extremity edema.   Echo 02/16/19:   1. The left ventricle has normal systolic function, with an ejection fraction of 55-60%. The cavity size was normal. Left ventricular diastolic function could not be evaluated secondary to atrial fibrillation.  2. Left atrial size was moderately dilated.  3. The mitral valve is degenerative. Moderate thickening of the mitral valve leaflet. There is moderate mitral annular calcification present.  4. The aortic valve is tricuspid Severely thickening of the aortic valve. Severe calcifcation of the aortic valve. Aortic valve regurgitation is mild to moderate by color flow Doppler. There is Moderate stenosis of the aortic valve, with a calculated  valve area of 0.53 cm. AV Vmax: 350.60 cm/s. AV Mean Grad: 29.2 mmHg. LVOT/AV VTI ratio: 0.19. Suspect that AS is more on the severe side given reduced dimensionless index.  5. The aorta is normal unless otherwise noted.  6. Compared to prior echo, the mean AVG has increased from 20 to 59mmHg.    Left Ventricle: The left ventricle has normal systolic function, with an ejection fraction of 55-60%. The cavity size was normal. There is no increase in left ventricular wall thickness. Left ventricular diastolic function could not be evaluated  secondary to atrial fibrillation.  Right Ventricle: The right ventricle has mildly reduced systolic function. The cavity was normal. There is no increase in right ventricular wall thickness. Right ventricular systolic pressure could not be assessed.  Left Atrium: Left atrial size was moderately dilated.  Right Atrium: Right atrial size  was normal in size.  Interatrial Septum: No atrial level shunt detected by color flow Doppler.  Pericardium: There is no evidence of pericardial effusion.  Mitral Valve: The mitral valve is degenerative in appearance. Moderate thickening of the mitral valve leaflet. There is moderate mitral annular calcification present. Mitral valve regurgitation is trivial by color flow Doppler.  Tricuspid Valve: The tricuspid valve is normal in structure. Tricuspid valve regurgitation is trivial by color flow Doppler.  Aortic Valve: The aortic valve is tricuspid Severely thickening of the aortic valve. Severe calcifcation of the aortic valve. Aortic valve regurgitation is mild to moderate by color flow Doppler. There is Moderate stenosis of the aortic valve, with a  calculated valve area of 0.53 cm.  Pulmonic Valve: The pulmonic valve was normal in structure. Pulmonic valve regurgitation is not visualized by color flow Doppler.  Aorta: The aorta is normal unless otherwise noted.  Venous: The inferior vena cava measures 1.96 cm, is normal in size with greater than 50% respiratory variability.    +--------------+--------++  LEFT VENTRICLE            +--------------+--------++  PLAX 2D                   +--------------+--------++  LVIDd:         5.10 cm    +--------------+--------++  LVIDs:         3.60 cm    +--------------+--------++  LV PW:         1.20 cm    +--------------+--------++  LV IVS:        0.80 cm    +--------------+--------++  LVOT diam:     1.90 cm    +--------------+--------++  LV SV:         69 ml      +--------------+--------++  LV SV Index:   38.23      +--------------+--------++  LVOT Area:     2.84 cm   +--------------+--------++                            +--------------+--------++  +---------------+----------++  RIGHT VENTRICLE              +---------------+----------++  RV Basal diam:  3.20 cm      +---------------+----------++  RV S prime:      11.07 cm/s   +---------------+----------++  TAPSE (M-mode): 1.3 cm       +---------------+----------++  +---------------+-------++-----------++  LEFT ATRIUM              Index         +---------------+-------++-----------++  LA diam:        4.20 cm  2.33 cm/m    +---------------+-------++-----------++  LA Vol (A2C):   78.7 ml  43.67 ml/m   +---------------+-------++-----------++  LA Vol (A4C):   68.1 ml  37.79 ml/m   +---------------+-------++-----------++  LA Biplane Vol: 76.9 ml  42.67 ml/m   +---------------+-------++-----------++ +------------+---------++-----------++  RIGHT ATRIUM            Index         +------------+---------++-----------++  RA Pressure: 3.00 mmHg                +------------+---------++-----------++  RA Area:     19.20 cm                +------------+---------++-----------++  RA Volume:   57.60 ml   31.96 ml/m   +------------+---------++-----------++  +------------------+------------++  AORTIC VALVE                      +------------------+------------++  AV Area (Vmax):    0.53 cm       +------------------+------------++  AV Area (Vmean):   0.59 cm       +------------------+------------++  AV Area (VTI):     0.53 cm       +------------------+------------++  AV Vmax:           350.60 cm/s    +------------------+------------++  AV Vmean:          255.400 cm/s   +------------------+------------++  AV VTI:            0.720 m        +------------------+------------++  AV Peak Grad:      49.2 mmHg      +------------------+------------++  AV Mean Grad:      29.2 mmHg      +------------------+------------++  LVOT Vmax:         65.63 cm/s     +------------------+------------++  LVOT Vmean:        52.933 cm/s    +------------------+------------++  LVOT VTI:          0.134 m        +------------------+------------++  LVOT/AV VTI ratio: 0.19           +------------------+------------++  AR PHT:  523 msec        +------------------+------------++   +-------------+-------++  AORTA                   +-------------+-------++  Ao Root diam: 3.30 cm   +-------------+-------++  +--------------+-----------++ +---------------+---------++  MV E velocity: 134.25 cm/s    TRICUSPID VALVE             +--------------+-----------++ +---------------+---------++                                Estimated RAP:  3.00 mmHg                                 +---------------+---------++                                 +--------------+-------+                                SHUNTS                                                +--------------+-------+                                Systemic VTI:  0.13 m                                 +--------------+-------+                                Systemic Diam: 1.90 cm                                +--------------+-------+  +---------+-------+  IVC                +---------+-------+  IVC diam: 1.96 cm  +---------+-------+   EKG:  EKG is ordered today. The ekg ordered today demonstrates Atrial fib, rate 106 bpm. Non-specific ST and T wave abnormalities.    Recent Labs: No results found for requested labs within last 8760 hours.   Lipid Panel    Component Value Date/Time   CHOL 121 03/31/2018 1546   TRIG 100 03/31/2018 1546   HDL 44 03/31/2018 1546   CHOLHDL 2.8 03/31/2018 1546   CHOLHDL 5 03/20/2015 1001   VLDL 38.8 03/20/2015 1001   LDLCALC 57 03/31/2018 1546   LDLDIRECT 93.9 02/03/2013 0933     Wt Readings from Last 3 Encounters:  02/16/19 156 lb (70.8 kg)  12/06/18 152 lb 8 oz (69.2 kg)  11/15/18 152 lb 6.4 oz (69.1 kg)     Other studies Reviewed: Additional studies/ records that were reviewed today include: . Review of the above records demonstrates:    Assessment and Plan:   1. CAD without angina. PCI/stenting of the RCA March 2019 at [facility].No chest pain. Will continue Plavix, statin and beta blocker. Cardiac cath next week  to exclude  CAD as part of TAVR workup.   2. HTN: BP is controlled. No changes today  3. Hyperlipidemia: LDL at goal in 2019. Continue statin  4. Atrial fib, paroxysmal: Atrial fib today. Rate controlled. He did not tolerate coumadin due to GI bleeding. We have not considered a NOAC given his ESRD. Continue Toprol.   5. Aortic stenosis: His aortic stenosis is now severe. The AVA is 0.53cm2 with a dimensionless index of 0.19. I personally reviewed the echo. The aortic valve leaflets are thickened and calcified with poor leaflet excursion. He is not a good candidate for most procedures given severe COPD, ESRD on HD and severe PAD but we can consider TAVR.  He may need alternative access given his history of PAD. Will plan a cardiac cath next at [facility] on 02/21/19.  I have reviewed the risks, indications, and alternatives to cardiac catheterization, possible angioplasty, and stenting with the patient. Risks include but are not limited to bleeding, infection, vascular injury, stroke, myocardial infection, arrhythmia, kidney injury, radiation-related injury in the case of prolonged fluoroscopy use, emergency cardiac surgery, and death. The patient understands the risks of serious complication is 1-2 in 9675 with diagnostic cardiac cath and 1-2% or less with angioplasty/stenting. Will discuss his case at our structural heart meeting. Will most likely move forward with planning for TAVR after the cath with CT scans, carotid dopplers and then referral to CT surgery.  Pre-cath labs today.   6. PAD: He is followed by Dr. Donnetta Hutching. He has undergone left femoral endarterectomy and patch angioplasty. He has disease throughout his iliac system. Will most likely be alternative access forTAVR   Current medicines are reviewed at length with the patient today.  The patient does not have concerns regarding medicines.  The following changes have been made:  no change  Labs/ tests ordered today include:   Orders Placed This Encounter   Procedures   Basic metabolic panel   CBC    Disposition:   FU with the valve team   Signed, Lauree Chandler, MD 02/16/2019 4:04 PM    [address], [place], [place]  [address] Phone: [phone]; Fax: [phone]

## 2019-02-15 NOTE — Progress Notes (Addendum)
Chief Complaint  Patient presents with   Follow-up    CAD   History of Present Illness: 84 yo male with history of paroxysmal atrial fibrillation, HTN, HLD, prior CVA, COPD, CAD, ESRD on HD and moderate aortic stenosis here today for cardiac follow up. He had been followed by cardiology in [place], [name] for many years until he moved to [place] in 2014. Cath records received from [facility] . Cardiac cath 11/30/1997. Mild distal Left main stenosis, 25% proximal LAD, minor irregularities Circumflex, 95% mid RCA stenosis treated with 3.5 x 32 mm bare metal stent. There was a ? Overlapping 3.5 x 18 mm bare metal stent more proximally. Echo 02/03/13 with LVEF=50-55%. He had normal carotid dopplers in [place] 2013. He was admitted to [facility] October 2015 with ruptured kidney cyst. Atrial fibrillation diagnosed in December 2015 at an office visit. He was initially started on Eliquis but progressed to ESRD so this was stopped. He was started on coumadin but had GI blood loss and severe bruising so this was stopped. He has been on low dose beta blocker therapy. He had been started on amiodarone but this was stopped. Low risk stress myoview September 2016.  He was admitted to [facility] in March 2019 with SOB. He was in atrial fib and treated for pneumonia and COPD exacerbation. Cardiac cath with severe RCA disease (70% ostial stenosis, 95% mid stenosis, 50% LAD proximal stenosis, Circumflex 40% mid stenosis. His vessels were all calcified. Echo March 2019 with LVEF=55%, severe AS with AVA 0.7 cm2, mean gradient 21 mmHg, mild LVH. He was transferred to [facility] and had rotational atherectomy of the ostial and mid RCA and placement of a 3.0 x 28 mm drug eluting stent in the mid vessel and a 3.5 x 24 mm drug eluting stent in the ostium of the RCA. Echo at [facility] with aortic valve area of 1.0 cm2 He was seen by the TAVR team and it was felt that TAVR was not indicated at this time as his AS was only  moderate. Mean gradient 25 mmHg. Echo here in September 2019 with normal LV systolic function, moderately severe AS with mean gradient 20 mmHg, AVA around 1.0 cm2 and DVI 0.21. Hospitalized in November 2019 at [facility] in [place], [name] due to pneumonia and echo there showed moderate AS with mean gradient 20 mmHg and AVA 1.1 cm2. LV function was normal. He has PAD and left leg ischemia in July 2019 treated with left femoral endarterectomy, Dr. Donnetta Hutching.   He is here today for follow up. Echo today with normal LV systolic function. The aortic valve leaflets are thickened and calcified. AVA 0.53cm2. Dimensionless index 0.19. Mean gradient 29 mmHg. The patient endorses worsened dyspnea. He can barely walk across the room without having to stop to catch his breath. He denies any chest pain, palpitations, lower extremity edema, orthopnea, PND, dizziness, near syncope or syncope.    Primary Care Physician: Jamal Maes, MD  Past Medical History:  Diagnosis Date   Acute and chronic respiratory failure 10/22/2012   Anxiety    Arthritis    "hands, knees" (03/15/2017)   Atrial fibrillation (HCC)    CAD (coronary artery disease) [place], [place]   CHF (congestive heart failure) ([place]) 09/2012   COPD (chronic obstructive pulmonary disease) ([place])    ESRD (end stage renal disease) on dialysis ([place])    "MWF; [facility] [place]" (03/15/2017)   Flu 09/24/2016   influenza b  HCAP (healthcare-associated pneumonia) 03/15/2017   High cholesterol    History of blood transfusion 2012   "related to bowel resection"   Hypertension    On home oxygen therapy    "1L typically at night" (03/15/2017)   Osteoarthritis 10/22/2012   Pneumonia April 2014; 09/2016   Shortness of breath dyspnea    Stroke [facility]) 2012   denies residual on 03/15/2017   Stroke [facility]) ~ 2013   "small one in his right eye"    Past Surgical History:  Procedure Laterality Date   ABDOMINAL AORTOGRAM N/A  12/16/2017   Procedure: ABDOMINAL AORTOGRAM;  Surgeon: Conrad [place], MD;  Location: [facility];  Service: Cardiovascular;  Laterality: N/A;   APPENDECTOMY  1984   AV FISTULA PLACEMENT Right 05/21/2015   Procedure: Right Arm Brachiocephalic ARTERIOVENOUS (AV) FISTULA CREATION;  Surgeon: Angelia Mould, MD;  Location: [facility];  Service: Vascular;  Laterality: Right;   CATARACT EXTRACTION W/ INTRAOCULAR LENS  IMPLANT, BILATERAL Bilateral    COLECTOMY  2012   partial colon removed - twisted bowel   CORONARY ANGIOPLASTY WITH [place]   2 stents   CYSTOSCOPY/RETROGRADE/URETEROSCOPY Bilateral 04/23/2014   Procedure: CYSTOSCOPY BILATERAL RETROGRADE,  LEFT URETEROSCOPY, LEFT STENT PLACEMENT;  Surgeon: Raynelle Bring, MD;  Location: [facility];  Service: Urology;  Laterality: Bilateral;   ENDARTERECTOMY FEMORAL Left 12/29/2017   Procedure: Left Femoral ENDARTERECTOMY with Patch Angioplasty using Xenosure patch;  Surgeon: Rosetta Posner, MD;  Location: [facility];  Service: Vascular;  Laterality: Left;   EYE SURGERY Left    "surgery to clean off lens after cataract OR"   INGUINAL HERNIA REPAIR Right    LOWER EXTREMITY ANGIOGRAPHY Bilateral 12/16/2017   Procedure: LOWER EXTREMITY ANGIOGRAPHY;  Surgeon: Conrad [place], MD;  Location: [facility];  Service: Cardiovascular;  Laterality: Bilateral;    Current Outpatient Medications  Medication Sig Dispense Refill   acetaminophen (TYLENOL) 500 MG tablet Take 1,000 mg by mouth every 6 (six) hours as needed for mild pain or headache.      albuterol (PROVENTIL HFA;VENTOLIN HFA) 108 (90 Base) MCG/ACT inhaler Inhale 2 puffs into the lungs every 4 (four) hours as needed for wheezing or shortness of breath. 1 Inhaler 0   aspirin EC 81 MG tablet Take 81 mg by mouth daily.     atorvastatin (LIPITOR) 80 MG tablet Take 1 tablet by mouth once daily 90 tablet 3   calcium acetate (PHOSLO) 667 MG capsule Take 667-1,334 mg by mouth See  admin instructions. Take 1334 mg by mouth 3 times daily with each meal and take 667 mg by mouth with each snack     cetirizine (ZYRTEC) 10 MG tablet Take 10 mg by mouth daily as needed for allergies.      clopidogrel (PLAVIX) 75 MG tablet Take 1 tablet by mouth once daily 90 tablet 2   dextromethorphan-guaiFENesin (MUCINEX DM) 30-600 MG 12hr tablet Take 1 tablet by mouth 2 (two) times daily.      ethyl chloride spray Apply 1 application topically every Monday, Wednesday, and Friday.      metoprolol succinate (TOPROL-XL) 50 MG 24 hr tablet Take 1 tablet (50 mg total) by mouth 2 (two) times daily. Take with or immediately following a meal. 180 tablet 3   midodrine (PROAMATINE) 10 MG tablet Take 10 mg by mouth See admin instructions. Take 10 mg by mouth on Mon/Wed/Fri at dialysis as needed for low blood pressure     OXYGEN  Inhale 1 L into the lungs daily as needed (for shortness of breath).      oxymetazoline (AFRIN) 0.05 % nasal spray Place 1 spray into both nostrils once as needed (to stop nose bleeds).      predniSONE (DELTASONE) 10 MG tablet Take 1 tablet (10 mg total) by mouth daily with breakfast. 100 tablet 0   SYMBICORT 160-4.5 MCG/ACT inhaler Inhale 2 puffs by mouth twice daily 11 g 3   No current facility-administered medications for this visit.     Allergies  Allergen Reactions   Yellow Dyes (Non-Tartrazine) Other (See Comments)    Burns arms; Cough medications (tussionex and benzonatate ok) specific - yellow dye used for ophthalmology procedure    Levaquin [Levofloxacin In D5w] Itching   Levofloxacin Nausea And Vomiting    Social History   Socioeconomic History   Marital status: Married    Spouse name: Not on file   Number of children: 3   Years of education: Not on file   Highest education level: Not on file  Occupational History   Occupation: retired-worked in Writer: [place] resource strain: Not on file   Food  insecurity    Worry: Not on file    Inability: Not on file   Transportation needs    Medical: Not on file    Non-medical: Not on file  Tobacco Use   Smoking status: Former Smoker    Packs/day: 1.00    Years: 49.00    Pack years: 49.00    Types: Cigarettes, Pipe, Cigars    Quit date: 02/21/2000    Years since quitting: 19.0   Smokeless tobacco: Former Systems developer    Types: Chew   Tobacco comment: "chewed when he was a kid"  Substance and Sexual Activity   Alcohol use: Yes    Comment: 03/15/2017 "nothing since the 1960's"   Drug use: No   Sexual activity: Never  Lifestyle   Physical activity    Days per week: Not on file    Minutes per session: Not on file   Stress: Not on file  Relationships   Social connections    Talks on phone: Not on file    Gets together: Not on file    Attends religious service: Not on file    Active member of club or organization: Not on file    Attends meetings of clubs or organizations: Not on file    Relationship status: Not on file   Intimate partner violence    Fear of current or ex partner: Not on file    Emotionally abused: Not on file    Physically abused: Not on file    Forced sexual activity: Not on file  Other Topics Concern   Not on file  Social History Narrative   Work or School: retired [employer] - [employer]      Home Situation: lives with wife in [place] Beliefs: [place] regular; poor diet          Family History  Problem Relation Age of Onset   Cancer Mother        Breast   Heart disease Father    Heart attack Father    Parkinson's disease Brother     Review of Systems:  As stated in the HPI and otherwise negative.   BP 130/60    Pulse (!) 106    Ht 5'  7" (1.702 m)    Wt 156 lb (70.8 kg)    SpO2 97%    BMI 24.43 kg/m   Physical Examination:  General: Well developed, well nourished, NAD  HEENT: OP clear, mucus membranes moist  SKIN: warm, dry. No rashes. Neuro: No focal  deficits  Musculoskeletal: Muscle strength 5/5 all ext  Psychiatric: Mood and affect normal  Neck: No JVD, no carotid bruits, no thyromegaly, no lymphadenopathy.  Lungs:Clear bilaterally, no wheezes, rhonci, crackles Cardiovascular: Regular rate and rhythm. Systolic murmur.  Abdomen:Soft. Bowel sounds present. Non-tender.  Extremities: No lower extremity edema.   Echo 02/16/19:   1. The left ventricle has normal systolic function, with an ejection fraction of 55-60%. The cavity size was normal. Left ventricular diastolic function could not be evaluated secondary to atrial fibrillation.  2. Left atrial size was moderately dilated.  3. The mitral valve is degenerative. Moderate thickening of the mitral valve leaflet. There is moderate mitral annular calcification present.  4. The aortic valve is tricuspid Severely thickening of the aortic valve. Severe calcifcation of the aortic valve. Aortic valve regurgitation is mild to moderate by color flow Doppler. There is Moderate stenosis of the aortic valve, with a calculated  valve area of 0.53 cm. AV Vmax: 350.60 cm/s. AV Mean Grad: 29.2 mmHg. LVOT/AV VTI ratio: 0.19. Suspect that AS is more on the severe side given reduced dimensionless index.  5. The aorta is normal unless otherwise noted.  6. Compared to prior echo, the mean AVG has increased from 20 to 8mmHg.    Left Ventricle: The left ventricle has normal systolic function, with an ejection fraction of 55-60%. The cavity size was normal. There is no increase in left ventricular wall thickness. Left ventricular diastolic function could not be evaluated  secondary to atrial fibrillation.  Right Ventricle: The right ventricle has mildly reduced systolic function. The cavity was normal. There is no increase in right ventricular wall thickness. Right ventricular systolic pressure could not be assessed.  Left Atrium: Left atrial size was moderately dilated.  Right Atrium: Right atrial size  was normal in size.  Interatrial Septum: No atrial level shunt detected by color flow Doppler.  Pericardium: There is no evidence of pericardial effusion.  Mitral Valve: The mitral valve is degenerative in appearance. Moderate thickening of the mitral valve leaflet. There is moderate mitral annular calcification present. Mitral valve regurgitation is trivial by color flow Doppler.  Tricuspid Valve: The tricuspid valve is normal in structure. Tricuspid valve regurgitation is trivial by color flow Doppler.  Aortic Valve: The aortic valve is tricuspid Severely thickening of the aortic valve. Severe calcifcation of the aortic valve. Aortic valve regurgitation is mild to moderate by color flow Doppler. There is Moderate stenosis of the aortic valve, with a  calculated valve area of 0.53 cm.  Pulmonic Valve: The pulmonic valve was normal in structure. Pulmonic valve regurgitation is not visualized by color flow Doppler.  Aorta: The aorta is normal unless otherwise noted.  Venous: The inferior vena cava measures 1.96 cm, is normal in size with greater than 50% respiratory variability.    +--------------+--------++  LEFT VENTRICLE            +--------------+--------++  PLAX 2D                   +--------------+--------++  LVIDd:         5.10 cm    +--------------+--------++  LVIDs:         3.60 cm    +--------------+--------++  LV PW:         1.20 cm    +--------------+--------++  LV IVS:        0.80 cm    +--------------+--------++  LVOT diam:     1.90 cm    +--------------+--------++  LV SV:         69 ml      +--------------+--------++  LV SV Index:   38.23      +--------------+--------++  LVOT Area:     2.84 cm   +--------------+--------++                            +--------------+--------++  +---------------+----------++  RIGHT VENTRICLE              +---------------+----------++  RV Basal diam:  3.20 cm      +---------------+----------++  RV S prime:      11.07 cm/s   +---------------+----------++  TAPSE (M-mode): 1.3 cm       +---------------+----------++  +---------------+-------++-----------++  LEFT ATRIUM              Index         +---------------+-------++-----------++  LA diam:        4.20 cm  2.33 cm/m    +---------------+-------++-----------++  LA Vol (A2C):   78.7 ml  43.67 ml/m   +---------------+-------++-----------++  LA Vol (A4C):   68.1 ml  37.79 ml/m   +---------------+-------++-----------++  LA Biplane Vol: 76.9 ml  42.67 ml/m   +---------------+-------++-----------++ +------------+---------++-----------++  RIGHT ATRIUM            Index         +------------+---------++-----------++  RA Pressure: 3.00 mmHg                +------------+---------++-----------++  RA Area:     19.20 cm                +------------+---------++-----------++  RA Volume:   57.60 ml   31.96 ml/m   +------------+---------++-----------++  +------------------+------------++  AORTIC VALVE                      +------------------+------------++  AV Area (Vmax):    0.53 cm       +------------------+------------++  AV Area (Vmean):   0.59 cm       +------------------+------------++  AV Area (VTI):     0.53 cm       +------------------+------------++  AV Vmax:           350.60 cm/s    +------------------+------------++  AV Vmean:          255.400 cm/s   +------------------+------------++  AV VTI:            0.720 m        +------------------+------------++  AV Peak Grad:      49.2 mmHg      +------------------+------------++  AV Mean Grad:      29.2 mmHg      +------------------+------------++  LVOT Vmax:         65.63 cm/s     +------------------+------------++  LVOT Vmean:        52.933 cm/s    +------------------+------------++  LVOT VTI:          0.134 m        +------------------+------------++  LVOT/AV VTI ratio: 0.19           +------------------+------------++  AR PHT:  523 msec        +------------------+------------++   +-------------+-------++  AORTA                   +-------------+-------++  Ao Root diam: 3.30 cm   +-------------+-------++  +--------------+-----------++ +---------------+---------++  MV E velocity: 134.25 cm/s    TRICUSPID VALVE             +--------------+-----------++ +---------------+---------++                                Estimated RAP:  3.00 mmHg                                 +---------------+---------++                                 +--------------+-------+                                SHUNTS                                                +--------------+-------+                                Systemic VTI:  0.13 m                                 +--------------+-------+                                Systemic Diam: 1.90 cm                                +--------------+-------+  +---------+-------+  IVC                +---------+-------+  IVC diam: 1.96 cm  +---------+-------+   EKG:  EKG is ordered today. The ekg ordered today demonstrates Atrial fib, rate 106 bpm. Non-specific ST and T wave abnormalities.    Recent Labs: No results found for requested labs within last 8760 hours.   Lipid Panel    Component Value Date/Time   CHOL 121 03/31/2018 1546   TRIG 100 03/31/2018 1546   HDL 44 03/31/2018 1546   CHOLHDL 2.8 03/31/2018 1546   CHOLHDL 5 03/20/2015 1001   VLDL 38.8 03/20/2015 1001   LDLCALC 57 03/31/2018 1546   LDLDIRECT 93.9 02/03/2013 0933     Wt Readings from Last 3 Encounters:  02/16/19 156 lb (70.8 kg)  12/06/18 152 lb 8 oz (69.2 kg)  11/15/18 152 lb 6.4 oz (69.1 kg)     Other studies Reviewed: Additional studies/ records that were reviewed today include: . Review of the above records demonstrates:    Assessment and Plan:   1. CAD without angina. PCI/stenting of the RCA March 2019 at [facility].No chest pain. Will continue Plavix, statin and beta blocker. Cardiac cath next week  to exclude  CAD as part of TAVR workup.   2. HTN: BP is controlled. No changes today  3. Hyperlipidemia: LDL at goal in 2019. Continue statin  4. Atrial fib, paroxysmal: Atrial fib today. Rate controlled. He did not tolerate coumadin due to GI bleeding. We have not considered a NOAC given his ESRD. Continue Toprol.   5. Aortic stenosis: His aortic stenosis is now severe. The AVA is 0.53cm2 with a dimensionless index of 0.19. I personally reviewed the echo. The aortic valve leaflets are thickened and calcified with poor leaflet excursion. He is not a good candidate for most procedures given severe COPD, ESRD on HD and severe PAD but we can consider TAVR.  He may need alternative access given his history of PAD. Will plan a cardiac cath next at [facility] on 02/21/19.  I have reviewed the risks, indications, and alternatives to cardiac catheterization, possible angioplasty, and stenting with the patient. Risks include but are not limited to bleeding, infection, vascular injury, stroke, myocardial infection, arrhythmia, kidney injury, radiation-related injury in the case of prolonged fluoroscopy use, emergency cardiac surgery, and death. The patient understands the risks of serious complication is 1-2 in 7342 with diagnostic cardiac cath and 1-2% or less with angioplasty/stenting. Will discuss his case at our structural heart meeting. Will most likely move forward with planning for TAVR after the cath with CT scans, carotid dopplers and then referral to CT surgery.  Pre-cath labs today.   6. PAD: He is followed by Dr. Donnetta Hutching. He has undergone left femoral endarterectomy and patch angioplasty. He has disease throughout his iliac system. Will most likely be alternative access forTAVR   Current medicines are reviewed at length with the patient today.  The patient does not have concerns regarding medicines.  The following changes have been made:  no change  Labs/ tests ordered today include:   Orders Placed This Encounter   Procedures   Basic metabolic panel   CBC    Disposition:   FU with the valve team   Signed, Lauree Chandler, MD 02/16/2019 4:04 PM    [address], [place], [place]  [address] Phone: [phone]; Fax: [phone]

## 2019-02-16 ENCOUNTER — Encounter: Payer: Self-pay | Admitting: Cardiovascular Disease

## 2019-02-16 ENCOUNTER — Ambulatory Visit (INDEPENDENT_AMBULATORY_CARE_PROVIDER_SITE_OTHER): Payer: Medicare PPO | Admitting: Cardiovascular Disease

## 2019-02-16 ENCOUNTER — Ambulatory Visit (HOSPITAL_COMMUNITY): Payer: Medicare PPO | Attending: Cardiology

## 2019-02-16 ENCOUNTER — Other Ambulatory Visit: Payer: Self-pay

## 2019-02-16 VITALS — BP 130/60 | HR 106 | Ht 67.0 in | Wt 156.0 lb

## 2019-02-16 DIAGNOSIS — I350 Nonrheumatic aortic (valve) stenosis: Secondary | ICD-10-CM | POA: Insufficient documentation

## 2019-02-16 DIAGNOSIS — I2510 Atherosclerotic heart disease of native coronary artery without angina pectoris: Secondary | ICD-10-CM | POA: Diagnosis not present

## 2019-02-16 DIAGNOSIS — I729 Aneurysm of unspecified site: Secondary | ICD-10-CM

## 2019-02-16 DIAGNOSIS — T81718A Complication of other artery following a procedure, not elsewhere classified, initial encounter: Secondary | ICD-10-CM

## 2019-02-16 NOTE — Patient Instructions (Addendum)
Medication Instructions:  Your physician recommends that you continue on your current medications as directed. Please refer to the Current Medication list given to you today.  If you need a refill on your cardiac medications before your next appointment, please call your pharmacy.   Lab work: BMET and CBC today  If you have labs (blood work) drawn today and your tests are completely normal, you will receive your results only by: [name] MyChart Message (if you have MyChart) OR . A paper copy in the mail If you have any lab test that is abnormal or we need to change your treatment, we will call you to review the results.  Testing/Procedures: Your physician has requested that you have a cardiac catheterization. Cardiac catheterization is used to diagnose and/or treat various heart conditions. Doctors may recommend this procedure for a number of different reasons. The most common reason is to evaluate chest pain. Chest pain can be a symptom of coronary artery disease (CAD), and cardiac catheterization can show whether plaque is narrowing or blocking your heart's arteries. This procedure is also used to evaluate the valves, as well as measure the blood flow and oxygen levels in different parts of your heart. For further information please visit [facility]uge[facility]iest[facility][email or web address]. Please follow instruction sheet, as given.   Follow-Up: You will be contacted about follow up   Any Other Special Instructions Will Be Listed Below (If Applicable).     [facility] OFFICE [place], [place] [address] Dept: [phone] Loc: [place].  02/16/2019  You are scheduled for a Cardiac Catheterization on Tuesday, September 1 with Dr. Lauree Chandler.  1. Please arrive at the [facility] (Main Entrance A) at [facility]: [address] [place], [place] [address] at 9:00 AM (This time is two  hours before your procedure to ensure your preparation). Free valet parking service is available.   Special note: Every effort is made to have your procedure done on time. Please understand that emergencies sometimes delay scheduled procedures.  2. Diet: Do not eat solid foods after midnight.  The patient may have clear liquids until 5am upon the day of the procedure.  3. Labs: You will have labs drawn today  4. Medication instructions in preparation for your procedure:   Contrast Allergy: No  On the morning of your procedure, take your Aspirin and Plavix/Clopidogrel and any morning medicines NOT listed above.  You may use sips of water.  5. Plan for one night stay--bring personal belongings. 6. Bring a current list of your medications and current insurance cards. 7. You MUST have a responsible person to drive you home. 8. Someone MUST be with you the first 24 hours after you arrive home or your discharge will be delayed. 9. Please wear clothes that are easy to get on and off and wear slip-on shoes.  Thank you for allowing [place] to care for you!   -- [place] [facility]

## 2019-02-17 ENCOUNTER — Other Ambulatory Visit: Payer: Self-pay | Admitting: Internal Medicine

## 2019-02-17 DIAGNOSIS — N186 End stage renal disease: Secondary | ICD-10-CM | POA: Diagnosis not present

## 2019-02-17 DIAGNOSIS — Z992 Dependence on renal dialysis: Secondary | ICD-10-CM | POA: Diagnosis not present

## 2019-02-17 LAB — CBC
Hematocrit: 35.4 % — ABNORMAL LOW (ref 37.5–51.0)
Hemoglobin: 11.2 g/dL — ABNORMAL LOW (ref 13.0–17.7)
MCH: 29.6 pg (ref 26.6–33.0)
MCHC: 31.6 g/dL (ref 31.5–35.7)
MCV: 93 fL (ref 79–97)
Platelets: 283 10*3/uL (ref 150–450)
RBC: 3.79 x10E6/uL — ABNORMAL LOW (ref 4.14–5.80)
RDW: 16 % — ABNORMAL HIGH (ref 11.6–15.4)
WBC: 10.4 10*3/uL (ref 3.4–10.8)

## 2019-02-17 LAB — BASIC METABOLIC PANEL
BUN/Creatinine Ratio: 8 — ABNORMAL LOW (ref 10–24)
BUN: 43 mg/dL — ABNORMAL HIGH (ref 8–27)
CO2: 24 mmol/L (ref 20–29)
Calcium: 9.2 mg/dL (ref 8.6–10.2)
Chloride: 97 mmol/L (ref 96–106)
Creatinine, Ser: 5.48 mg/dL — ABNORMAL HIGH (ref 0.76–1.27)
GFR calc Af Amer: 10 mL/min/{1.73_m2} — ABNORMAL LOW (ref >59)
GFR calc non Af Amer: 9 mL/min/{1.73_m2} — ABNORMAL LOW (ref >59)
Glucose: 221 mg/dL — ABNORMAL HIGH (ref 65–99)
Potassium: 4.8 mmol/L (ref 3.5–5.2)
Sodium: 141 mmol/L (ref 134–144)

## 2019-02-20 ENCOUNTER — Telehealth: Payer: Self-pay | Admitting: *Deleted

## 2019-02-20 DIAGNOSIS — N186 End stage renal disease: Secondary | ICD-10-CM | POA: Diagnosis not present

## 2019-02-20 DIAGNOSIS — Z992 Dependence on renal dialysis: Secondary | ICD-10-CM | POA: Diagnosis not present

## 2019-02-20 NOTE — Telephone Encounter (Signed)
Pt contacted pre-catheterization scheduled at [facility] for: Tuesday February 21, 2019 12 noon Verified arrival time and place: [facility]) at: 9 AM-needs Covid on arrival   No solid food after midnight prior to cath, clear liquids until 5 AM day of procedure. Contrast allergy: no  Pt's wife knows to hold lasix in AM if pt normally takes Tues AM.   AM meds can be  taken pre-cath with sip of water including: ASA 81 mg Plavix 75 mg  Confirmed patient has responsible person to drive home post procedure and observe 24 hours after arriving home: yes  Currently, due to Covid-19 pandemic, only one support person will be allowed with patient. Must be the same support person for that patient's entire stay, will be screened and required to wear a mask. They will be asked to wait in the waiting room for the duration of the patient's stay.  Patients are required to wear a mask when they enter the hospital.      COVID-19 Pre-Screening Questions:  . In the past 7 to 10 days have you had a cough,  shortness of breath, headache, congestion, fever (100 or greater) body aches, chills, sore throat, or sudden loss of taste or sense of smell?  COPD . Have you been around anyone with known Covid 19? no . Have you been around anyone who is awaiting Covid 19 test results in the past 7 to 10 days? no . Have you been around anyone who has been exposed to Covid 19, or has mentioned symptoms of Covid 19 within the past 7 to 10 days? no  I reviewed procedure/mask/visitor instructions, Covid-19 screening questions with Thayer Headings (DPR,pt's wife), she verbalized understanding, thanked me for call.

## 2019-02-21 ENCOUNTER — Other Ambulatory Visit: Payer: Self-pay

## 2019-02-21 ENCOUNTER — Ambulatory Visit (HOSPITAL_COMMUNITY)
Admission: RE | Admit: 2019-02-21 | Discharge: 2019-02-22 | Disposition: A | Discharge status: Home / Self Care | Payer: Medicare PPO | Attending: Cardiovascular Disease | Admitting: Cardiovascular Disease

## 2019-02-21 ENCOUNTER — Encounter (HOSPITAL_COMMUNITY)
Admission: RE | Disposition: A | Discharge status: Home / Self Care | Payer: Self-pay | Source: Home / Self Care | Attending: Cardiovascular Disease

## 2019-02-21 ENCOUNTER — Encounter (HOSPITAL_COMMUNITY): Payer: Self-pay

## 2019-02-21 DIAGNOSIS — Z7982 Long term (current) use of aspirin: Secondary | ICD-10-CM | POA: Insufficient documentation

## 2019-02-21 DIAGNOSIS — Z881 Allergy status to other antibiotic agents status: Secondary | ICD-10-CM | POA: Insufficient documentation

## 2019-02-21 DIAGNOSIS — Z992 Dependence on renal dialysis: Secondary | ICD-10-CM | POA: Insufficient documentation

## 2019-02-21 DIAGNOSIS — I509 Heart failure, unspecified: Secondary | ICD-10-CM | POA: Insufficient documentation

## 2019-02-21 DIAGNOSIS — Z8673 Personal history of transient ischemic attack (TIA), and cerebral infarction without residual deficits: Secondary | ICD-10-CM | POA: Diagnosis not present

## 2019-02-21 DIAGNOSIS — J440 Chronic obstructive pulmonary disease with acute lower respiratory infection: Secondary | ICD-10-CM | POA: Insufficient documentation

## 2019-02-21 DIAGNOSIS — E1122 Type 2 diabetes mellitus with diabetic chronic kidney disease: Secondary | ICD-10-CM | POA: Diagnosis not present

## 2019-02-21 DIAGNOSIS — I200 Unstable angina: Secondary | ICD-10-CM | POA: Diagnosis present

## 2019-02-21 DIAGNOSIS — Z79899 Other long term (current) drug therapy: Secondary | ICD-10-CM | POA: Insufficient documentation

## 2019-02-21 DIAGNOSIS — I120 Hypertensive chronic kidney disease with stage 5 chronic kidney disease or end stage renal disease: Secondary | ICD-10-CM | POA: Diagnosis not present

## 2019-02-21 DIAGNOSIS — Z23 Encounter for immunization: Secondary | ICD-10-CM | POA: Diagnosis not present

## 2019-02-21 DIAGNOSIS — Z7902 Long term (current) use of antithrombotics/antiplatelets: Secondary | ICD-10-CM | POA: Diagnosis not present

## 2019-02-21 DIAGNOSIS — Z955 Presence of coronary angioplasty implant and graft: Secondary | ICD-10-CM | POA: Insufficient documentation

## 2019-02-21 DIAGNOSIS — N186 End stage renal disease: Secondary | ICD-10-CM | POA: Diagnosis not present

## 2019-02-21 DIAGNOSIS — E785 Hyperlipidemia, unspecified: Secondary | ICD-10-CM | POA: Diagnosis not present

## 2019-02-21 DIAGNOSIS — M1990 Unspecified osteoarthritis, unspecified site: Secondary | ICD-10-CM | POA: Insufficient documentation

## 2019-02-21 DIAGNOSIS — Z20828 Contact with and (suspected) exposure to other viral communicable diseases: Secondary | ICD-10-CM | POA: Diagnosis not present

## 2019-02-21 DIAGNOSIS — I132 Hypertensive heart and chronic kidney disease with heart failure and with stage 5 chronic kidney disease, or end stage renal disease: Secondary | ICD-10-CM | POA: Diagnosis not present

## 2019-02-21 DIAGNOSIS — D631 Anemia in chronic kidney disease: Secondary | ICD-10-CM | POA: Diagnosis not present

## 2019-02-21 DIAGNOSIS — I2510 Atherosclerotic heart disease of native coronary artery without angina pectoris: Secondary | ICD-10-CM

## 2019-02-21 DIAGNOSIS — Z7901 Long term (current) use of anticoagulants: Secondary | ICD-10-CM | POA: Insufficient documentation

## 2019-02-21 DIAGNOSIS — I480 Paroxysmal atrial fibrillation: Secondary | ICD-10-CM | POA: Diagnosis not present

## 2019-02-21 DIAGNOSIS — Z7951 Long term (current) use of inhaled steroids: Secondary | ICD-10-CM | POA: Diagnosis not present

## 2019-02-21 DIAGNOSIS — I350 Nonrheumatic aortic (valve) stenosis: Secondary | ICD-10-CM | POA: Diagnosis not present

## 2019-02-21 DIAGNOSIS — E7800 Pure hypercholesterolemia, unspecified: Secondary | ICD-10-CM | POA: Insufficient documentation

## 2019-02-21 DIAGNOSIS — F419 Anxiety disorder, unspecified: Secondary | ICD-10-CM | POA: Insufficient documentation

## 2019-02-21 DIAGNOSIS — I25110 Atherosclerotic heart disease of native coronary artery with unstable angina pectoris: Secondary | ICD-10-CM | POA: Diagnosis not present

## 2019-02-21 DIAGNOSIS — N2581 Secondary hyperparathyroidism of renal origin: Secondary | ICD-10-CM | POA: Diagnosis not present

## 2019-02-21 HISTORY — PX: CORONARY BALLOON ANGIOPLASTY: CATH118233

## 2019-02-21 HISTORY — PX: RIGHT/LEFT HEART CATH AND CORONARY ANGIOGRAPHY: CATH118266

## 2019-02-21 LAB — POCT I-STAT 7, (LYTES, BLD GAS, ICA,H+H)
Acid-base deficit: 1 mmol/L (ref 0.0–2.0)
Bicarbonate: 25.5 mmol/L (ref 20.0–28.0)
Calcium, Ion: 1.12 mmol/L — ABNORMAL LOW (ref 1.15–1.40)
HCT: 38 % — ABNORMAL LOW (ref 39.0–52.0)
Hemoglobin: 12.9 g/dL — ABNORMAL LOW (ref 13.0–17.0)
O2 Saturation: 99 %
Potassium: 4.6 mmol/L (ref 3.5–5.1)
Sodium: 132 mmol/L — ABNORMAL LOW (ref 135–145)
TCO2: 27 mmol/L (ref 22–32)
pCO2 arterial: 48.2 mmHg — ABNORMAL HIGH (ref 32.0–48.0)
pH, Arterial: 7.331 — ABNORMAL LOW (ref 7.350–7.450)
pO2, Arterial: 147 mmHg — ABNORMAL HIGH (ref 83.0–108.0)

## 2019-02-21 LAB — POCT ACTIVATED CLOTTING TIME
Activated Clotting Time: 340 seconds
Activated Clotting Time: 290 seconds
Activated Clotting Time: 324 seconds
Activated Clotting Time: 263 seconds
Activated Clotting Time: 219 seconds
Activated Clotting Time: 191 seconds

## 2019-02-21 LAB — POCT I-STAT EG7
Acid-base deficit: 1 mmol/L (ref 0.0–2.0)
Bicarbonate: 26.1 mmol/L (ref 20.0–28.0)
Calcium, Ion: 1.14 mmol/L — ABNORMAL LOW (ref 1.15–1.40)
HCT: 38 % — ABNORMAL LOW (ref 39.0–52.0)
Hemoglobin: 12.9 g/dL — ABNORMAL LOW (ref 13.0–17.0)
O2 Saturation: 76 %
Potassium: 4.6 mmol/L (ref 3.5–5.1)
Sodium: 133 mmol/L — ABNORMAL LOW (ref 135–145)
TCO2: 28 mmol/L (ref 22–32)
pCO2, Ven: 54.4 mmHg (ref 44.0–60.0)
pH, Ven: 7.289 (ref 7.250–7.430)
pO2, Ven: 46 mmHg — ABNORMAL HIGH (ref 32.0–45.0)

## 2019-02-21 LAB — SARS CORONAVIRUS 2 BY RT PCR (HOSPITAL ORDER, PERFORMED IN ~~LOC~~ HOSPITAL LAB): SARS Coronavirus 2: NEGATIVE

## 2019-02-21 SURGERY — RIGHT/LEFT HEART CATH AND CORONARY ANGIOGRAPHY
Anesthesia: LOCAL

## 2019-02-21 MED ORDER — MIDODRINE HCL 5 MG PO TABS
10.0000 mg | ORAL_TABLET | ORAL | Status: DC
  Administered 2019-02-22: 08:00:00 10 mg via ORAL

## 2019-02-21 MED ORDER — HEPARIN SODIUM (PORCINE) 1000 UNIT/ML IJ SOLN
INTRAMUSCULAR | Status: AC
  Filled 2019-02-21: qty 1

## 2019-02-21 MED ORDER — MIDAZOLAM HCL 2 MG/2ML IJ SOLN
INTRAMUSCULAR | Status: DC | PRN
  Administered 2019-02-21 (×3): 1 mg via INTRAVENOUS

## 2019-02-21 MED ORDER — FENTANYL CITRATE (PF) 100 MCG/2ML IJ SOLN
INTRAMUSCULAR | Status: DC | PRN
  Administered 2019-02-21: 25 ug via INTRAVENOUS
  Administered 2019-02-21: 50 ug via INTRAVENOUS

## 2019-02-21 MED ORDER — CLOPIDOGREL BISULFATE 75 MG PO TABS
75.0000 mg | ORAL_TABLET | Freq: Every day | ORAL | Status: DC
  Administered 2019-02-22: 75 mg via ORAL
  Filled 2019-02-21: qty 1

## 2019-02-21 MED ORDER — LIDOCAINE HCL (PF) 1 % IJ SOLN
INTRAMUSCULAR | Status: DC | PRN
  Administered 2019-02-21: 20 mL via INTRADERMAL

## 2019-02-21 MED ORDER — METOPROLOL SUCCINATE ER 50 MG PO TB24: 50.0000 mg | ORAL_TABLET | Freq: Every day | ORAL | Status: DC

## 2019-02-21 MED ORDER — ALBUTEROL SULFATE HFA 108 (90 BASE) MCG/ACT IN AERS: 2.0000 | INHALATION_SPRAY | RESPIRATORY_TRACT | Status: DC | PRN

## 2019-02-21 MED ORDER — FENTANYL CITRATE (PF) 100 MCG/2ML IJ SOLN
INTRAMUSCULAR | Status: AC
  Filled 2019-02-21: qty 2

## 2019-02-21 MED ORDER — ACETAMINOPHEN 325 MG PO TABS: 650.0000 mg | ORAL_TABLET | ORAL | Status: DC | PRN

## 2019-02-21 MED ORDER — SODIUM CHLORIDE 0.9 % IV SOLN: 250.0000 mL | INTRAVENOUS | Status: DC | PRN

## 2019-02-21 MED ORDER — SODIUM CHLORIDE 0.9% FLUSH: 3.0000 mL | INTRAVENOUS | Status: DC | PRN

## 2019-02-21 MED ORDER — METOPROLOL SUCCINATE ER 50 MG PO TB24: 50.0000 mg | ORAL_TABLET | Freq: Two times a day (BID) | ORAL | Status: DC

## 2019-02-21 MED ORDER — HYDRALAZINE HCL 20 MG/ML IJ SOLN: 10.0000 mg | INTRAMUSCULAR | Status: AC | PRN

## 2019-02-21 MED ORDER — PANTOPRAZOLE SODIUM 40 MG PO TBEC: 40.0000 mg | DELAYED_RELEASE_TABLET | Freq: Two times a day (BID) | ORAL | Status: DC

## 2019-02-21 MED ORDER — SODIUM CHLORIDE 0.9% FLUSH: 3.0000 mL | Freq: Two times a day (BID) | INTRAVENOUS | Status: DC

## 2019-02-21 MED ORDER — ALBUTEROL SULFATE (2.5 MG/3ML) 0.083% IN NEBU: 2.5000 mg | INHALATION_SOLUTION | RESPIRATORY_TRACT | Status: DC | PRN

## 2019-02-21 MED ORDER — SODIUM CHLORIDE 0.9% FLUSH
3.0000 mL | INTRAVENOUS | Status: DC | PRN
  Administered 2019-02-21: 3 mL via INTRAVENOUS
  Filled 2019-02-21: qty 3

## 2019-02-21 MED ORDER — LIDOCAINE HCL (PF) 1 % IJ SOLN
INTRAMUSCULAR | Status: AC
  Filled 2019-02-21: qty 30

## 2019-02-21 MED ORDER — CALCIUM ACETATE (PHOS BINDER) 667 MG PO CAPS
1334.0000 mg | ORAL_CAPSULE | Freq: Three times a day (TID) | ORAL | Status: DC
  Administered 2019-02-22: 1334 mg via ORAL
  Filled 2019-02-21: qty 2

## 2019-02-21 MED ORDER — ASPIRIN EC 81 MG PO TBEC
81.0000 mg | DELAYED_RELEASE_TABLET | Freq: Every day | ORAL | Status: DC
  Administered 2019-02-22: 81 mg via ORAL
  Filled 2019-02-21: qty 1

## 2019-02-21 MED ORDER — SODIUM CHLORIDE 0.9% FLUSH
3.0000 mL | Freq: Two times a day (BID) | INTRAVENOUS | Status: DC
  Administered 2019-02-21 – 2019-02-22 (×2): 3 mL via INTRAVENOUS

## 2019-02-21 MED ORDER — MOMETASONE FURO-FORMOTEROL FUM 200-5 MCG/ACT IN AERO
2.0000 | INHALATION_SPRAY | Freq: Two times a day (BID) | RESPIRATORY_TRACT | Status: DC
  Administered 2019-02-22 (×2): 2 via RESPIRATORY_TRACT
  Filled 2019-02-21 (×2): qty 8.8

## 2019-02-21 MED ORDER — ATORVASTATIN CALCIUM 80 MG PO TABS
80.0000 mg | ORAL_TABLET | Freq: Every day | ORAL | Status: DC
  Administered 2019-02-21: 80 mg via ORAL
  Filled 2019-02-21: qty 1

## 2019-02-21 MED ORDER — HEPARIN SODIUM (PORCINE) 1000 UNIT/ML IJ SOLN
INTRAMUSCULAR | Status: DC | PRN
  Administered 2019-02-21: 3000 [IU] via INTRAVENOUS
  Administered 2019-02-21: 10000 [IU] via INTRAVENOUS

## 2019-02-21 MED ORDER — MIDAZOLAM HCL 2 MG/2ML IJ SOLN
INTRAMUSCULAR | Status: AC
  Filled 2019-02-21: qty 2

## 2019-02-21 MED ORDER — CHLORHEXIDINE GLUCONATE CLOTH 2 % EX PADS
6.0000 | MEDICATED_PAD | Freq: Every day | CUTANEOUS | Status: DC
  Administered 2019-02-21: 20:00:00 6 via TOPICAL

## 2019-02-21 MED ORDER — SODIUM CHLORIDE 0.9 % IV SOLN: INTRAVENOUS | Status: DC

## 2019-02-21 MED ORDER — ONDANSETRON HCL 4 MG/2ML IJ SOLN: 4.0000 mg | Freq: Four times a day (QID) | INTRAMUSCULAR | Status: DC | PRN

## 2019-02-21 MED ORDER — HEPARIN (PORCINE) IN NACL 1000-0.9 UT/500ML-% IV SOLN
INTRAVENOUS | Status: DC | PRN
  Administered 2019-02-21 (×3): 500 mL

## 2019-02-21 MED ORDER — CALCIUM ACETATE (PHOS BINDER) 667 MG PO CAPS: 667.0000 mg | ORAL_CAPSULE | Freq: Every day | ORAL | Status: DC | PRN

## 2019-02-21 MED ORDER — HEPARIN (PORCINE) IN NACL 1000-0.9 UT/500ML-% IV SOLN
INTRAVENOUS | Status: AC
  Filled 2019-02-21: qty 1000

## 2019-02-21 MED ORDER — LABETALOL HCL 5 MG/ML IV SOLN: 10.0000 mg | INTRAVENOUS | Status: AC | PRN

## 2019-02-21 MED ORDER — FENTANYL CITRATE (PF) 100 MCG/2ML IJ SOLN
25.0000 ug | Freq: Once | INTRAMUSCULAR | Status: AC
  Administered 2019-02-21: 18:00:00 25 ug via INTRAVENOUS

## 2019-02-21 MED ORDER — IOHEXOL 350 MG/ML SOLN
INTRAVENOUS | Status: DC | PRN
  Administered 2019-02-21: 15:00:00 170 mL via INTRA_ARTERIAL

## 2019-02-21 SURGICAL SUPPLY — 32 items
BALLN EMERGE MR 2.0X8 (BALLOONS) ×2
BALLN SAPPHIRE ~~LOC~~ 3.0X12 (BALLOONS) ×2 IMPLANT
BALLN ~~LOC~~ EMERGE MR 2.0X12 (BALLOONS) ×2
BALLN ~~LOC~~ EMERGE MR 2.5X8 (BALLOONS) ×2
BALLOON EMERGE MR 2.0X8 (BALLOONS) ×1 IMPLANT
BALLOON ~~LOC~~ EMERGE MR 2.0X12 (BALLOONS) ×1 IMPLANT
BALLOON ~~LOC~~ EMERGE MR 2.5X8 (BALLOONS) ×1 IMPLANT
CATH INFINITI 5 FR AL2 (CATHETERS) ×2 IMPLANT
CATH INFINITI 5FR AL1 (CATHETERS) ×2 IMPLANT
CATH INFINITI 5FR MULTPACK ANG (CATHETERS) ×2 IMPLANT
CATH LAUNCHER 6FR AL1 (CATHETERS) ×1 IMPLANT
CATH LAUNCHER 6FR AR1 (CATHETERS) ×2 IMPLANT
CATH LAUNCHER 6FR JR4 (CATHETERS) ×2 IMPLANT
CATH SWAN GANZ 7F STRAIGHT (CATHETERS) ×2 IMPLANT
CATHETER LAUNCHER 6FR AL1 (CATHETERS) ×2
CATHETER LAUNCHER 6FR NOTO (CATHETERS) ×2 IMPLANT
GUIDELINER 6F (CATHETERS) ×2 IMPLANT
KIT ENCORE 26 ADVANTAGE (KITS) ×2 IMPLANT
KIT HEART LEFT (KITS) ×2 IMPLANT
PACK CARDIAC CATHETERIZATION (CUSTOM PROCEDURE TRAY) ×2 IMPLANT
PINNACLE LONG 6F 25CM (SHEATH) ×2
SHEATH INTRO PINNACLE 6F 25CM (SHEATH) ×1 IMPLANT
SHEATH PINNACLE 5F 10CM (SHEATH) ×2 IMPLANT
SHEATH PINNACLE 6F 10CM (SHEATH) ×2 IMPLANT
SHEATH PINNACLE 7F 10CM (SHEATH) ×2 IMPLANT
SHEATH PROBE COVER 6X72 (BAG) ×2 IMPLANT
TRANSDUCER W/STOPCOCK (MISCELLANEOUS) ×2 IMPLANT
TUBING CIL FLEX 10 FLL-RA (TUBING) ×2 IMPLANT
WIRE COUGAR XT STRL 190CM (WIRE) ×2 IMPLANT
WIRE EMERALD 3MM-J .035X150CM (WIRE) ×2 IMPLANT
WIRE EMERALD ST .035X150CM (WIRE) ×2 IMPLANT
WIRE HI TORQ WHISPER MS 190CM (WIRE) ×2 IMPLANT

## 2019-02-21 NOTE — Progress Notes (Addendum)
[facility] Renal Consultation Note    Indication for Consultation:  Management of ESRD/hemodialysis; anemia, hypertension/volume and secondary hyperparathyroidism PCP:  HPI: Terry [name]. is a 84 y.o. male with ESRD on hemodialysis MWF at [facility]. He is a former patient of [employer]. PMH of HTN, DM, COPD, CAD, AS, Afib not on coumadin, PAD, SHPT, AOCD. Patient presented to [facility] for R and L heart cath per Dr. [name]. He is S/P PTCA of stents proximal and mid RCA. He is being seen in cath lab holding area, he has not complaints. Last HD 02/20/2019. He denies chest pain, SOB, says he has been doing well. He will stay over night and have HD here prior to returning home to [place].   Past Medical History:  Diagnosis Date  . Acute and chronic respiratory failure 10/22/2012  . Anxiety   . Arthritis    "hands, knees" (03/15/2017)  . Atrial fibrillation ([place])   . CAD (coronary artery disease) [place], [name]  . CHF (congestive heart failure) ([place]) 09/2012  . COPD (chronic obstructive pulmonary disease) ([place])   . ESRD (end stage renal disease) on dialysis [facility])    "MWF; [facility] [place]" (03/15/2017)  . Flu 09/24/2016   influenza b  . HCAP (healthcare-associated pneumonia) 03/15/2017  . High cholesterol   . History of blood transfusion 2012   "related to bowel resection"  . Hypertension   . On home oxygen therapy    "1L typically at night" (03/15/2017)  . Osteoarthritis 10/22/2012  . Pneumonia April 2014; 09/2016  . Shortness of breath dyspnea   . Stroke [facility]) 2012   denies residual on 03/15/2017  . Stroke [facility]) ~ 2013   "small one in his right eye"   Past Surgical History:  Procedure Laterality Date  . ABDOMINAL AORTOGRAM N/A 12/16/2017   Procedure: ABDOMINAL AORTOGRAM;  Surgeon: Conrad Scofield, MD;  Location: [place] CV LAB;  Service: Cardiovascular;  Laterality: N/A;  . APPENDECTOMY  1984  . AV FISTULA PLACEMENT  Right 05/21/2015   Procedure: Right Arm Brachiocephalic ARTERIOVENOUS (AV) FISTULA CREATION;  Surgeon: Angelia Mould, MD;  Location: [place];  Service: Vascular;  Laterality: Right;  . CATARACT EXTRACTION W/ INTRAOCULAR LENS  IMPLANT, BILATERAL Bilateral   . COLECTOMY  2012   partial colon removed - twisted bowel  . CORONARY ANGIOPLASTY WITH STENT PLACEMENT  1999   2 stents  . CYSTOSCOPY/RETROGRADE/URETEROSCOPY Bilateral 04/23/2014   Procedure: CYSTOSCOPY BILATERAL RETROGRADE,  LEFT URETEROSCOPY, LEFT STENT PLACEMENT;  Surgeon: Raynelle Bring, MD;  Location: [facility];  Service: Urology;  Laterality: Bilateral;  . ENDARTERECTOMY FEMORAL Left 12/29/2017   Procedure: Left Femoral ENDARTERECTOMY with Patch Angioplasty using Xenosure patch;  Surgeon: Rosetta Posner, MD;  Location: [place];  Service: Vascular;  Laterality: Left;  . EYE SURGERY Left    "surgery to clean off lens after cataract OR"  . INGUINAL HERNIA REPAIR Right   . LOWER EXTREMITY ANGIOGRAPHY Bilateral 12/16/2017   Procedure: LOWER EXTREMITY ANGIOGRAPHY;  Surgeon: Conrad Basye, MD;  Location: [place] CV LAB;  Service: Cardiovascular;  Laterality: Bilateral;   Family History  Problem Relation Age of Onset  . Cancer Mother        Breast  . Heart disease Father   . Heart attack Father   . Parkinson's disease Brother    Social History:  reports that he quit smoking about 19 years ago. His smoking use included cigarettes, pipe, and  cigars. He has a 49.00 pack-year smoking history. He has quit using smokeless tobacco.  His smokeless tobacco use included chew. He reports current alcohol use. He reports that he does not use drugs. Allergies  Allergen Reactions  . Yellow Dyes (Non-Tartrazine) Other (See Comments)    Burns arms; Cough medications (tussionex and benzonatate ok) specific - yellow dye used for ophthalmology procedure   . Levaquin [Levofloxacin In D5w] Itching  . Levofloxacin Nausea And Vomiting   Prior to Admission  medications   Medication Sig Start Date End Date Taking? Authorizing Provider  acetaminophen (TYLENOL) 500 MG tablet Take 1,000 mg by mouth every 6 (six) hours as needed for mild pain or headache.    Yes [provider]  albuterol (PROVENTIL HFA;VENTOLIN HFA) 108 (90 Base) MCG/ACT inhaler Inhale 2 puffs into the lungs every 4 (four) hours as needed for wheezing or shortness of breath. 11/21/17  Yes Orlie Dakin, MD  aspirin EC 81 MG tablet Take 81 mg by mouth daily.   Yes [provider]  atorvastatin (LIPITOR) 80 MG tablet Take 1 tablet by mouth once daily Patient taking differently: Take 80 mg by mouth at bedtime.  12/16/18  Yes Burnell Blanks, MD  calcium acetate (PHOSLO) 667 MG capsule Take [phone] mg by mouth See admin instructions. Take 2 capsules (1334 mg) by moth 3 times daily with each meal & take 1 capsule (667 mg) by mouth with each snack   Yes [provider]  cetirizine (ZYRTEC) 10 MG tablet Take 10 mg by mouth daily as needed for allergies.    Yes [provider]  clopidogrel (PLAVIX) 75 MG tablet Take 1 tablet by mouth once daily Patient taking differently: Take 75 mg by mouth daily.  11/16/18  Yes Burnell Blanks, MD  dextromethorphan-guaiFENesin [facility] DM) 30-600 MG 12hr tablet Take 1 tablet by mouth 2 (two) times daily as needed (cough/congestion.).    Yes [provider]  ethyl chloride spray Apply 1 application topically every Monday, Wednesday, and Friday.  05/28/17  Yes [provider]  furosemide (LASIX) 40 MG tablet Take 40 mg by mouth 2 (two) times a week. Saturdays & Sundays ONLY   Yes [provider]  metoprolol succinate (TOPROL-XL) 50 MG 24 hr tablet Take 1 tablet (50 mg total) by mouth 2 (two) times daily. Take with or immediately following a meal. Patient taking differently: Take 25-50 mg by mouth See admin instructions. Take 1 tablet (50 mg) by mouth on Sundays, Tuesdays, Thursdays, &  Saturdays in the morning Take 0.5 tablet (25 mg) by mouth on Mondays, Wednesdays, Fridays, & Saturdays nights. 03/31/18  Yes Burnell Blanks, MD  midodrine (PROAMATINE) 10 MG tablet Take 10 mg by mouth See admin instructions. Take 10 mg by mouth on Mon/Wed/Fri at dialysis as needed for low blood pressure   Yes [provider]  OXYGEN Inhale 1 L into the lungs daily as needed (for shortness of breath).    Yes [provider]  oxymetazoline (AFRIN) 0.05 % nasal spray Place 1 spray into both nostrils once as needed (to stop nose bleeds).    Yes [provider]  pantoprazole (PROTONIX) 40 MG tablet Take 40 mg by mouth 2 (two) times daily before a meal. Breakfast & supper   Yes [provider]  predniSONE (DELTASONE) 10 MG tablet Take 1 tablet (10 mg total) by mouth daily with breakfast. 12/06/18  Yes Tanda Rockers, MD  SYMBICORT 160-4.5 MCG/ACT inhaler Inhale 2 puffs  by mouth twice daily 02/17/19  Yes Tanda Rockers, MD   Current Facility-Administered Medications  Medication Dose Route Frequency Provider Last Rate Last Dose  . 0.9 %  sodium chloride infusion   Intravenous Continuous Lauree Chandler D, MD      . 0.9 %  sodium chloride infusion  250 mL Intravenous PRN Burnell Blanks, MD      . hydrALAZINE (APRESOLINE) injection 10 mg  10 mg Intravenous Q20 Min PRN Burnell Blanks, MD      . labetalol (NORMODYNE) injection 10 mg  10 mg Intravenous Q10 min PRN Burnell Blanks, MD      . ondansetron [facility]) injection 4 mg  4 mg Intravenous Q6H PRN Burnell Blanks, MD      . sodium chloride flush (NS) 0.9 % injection 3 mL  3 mL Intravenous Q12H Burnell Blanks, MD      . sodium chloride flush (NS) 0.9 % injection 3 mL  3 mL Intravenous PRN Burnell Blanks, MD       Labs: Basic Metabolic Panel: Recent Labs  Lab 02/16/19 1502 02/21/19 1308  NA 141 132*  133*  K 4.8 4.6  4.6  CL 97  --   CO2 24  --    GLUCOSE 221*  --   BUN 43*  --   CREATININE 5.48*  --   CALCIUM 9.2  --    Liver Function Tests: No results for input(s): AST, ALT, ALKPHOS, BILITOT, PROT, ALBUMIN in the last 168 hours. No results for input(s): LIPASE, AMYLASE in the last 168 hours. No results for input(s): AMMONIA in the last 168 hours. CBC: Recent Labs  Lab 02/16/19 1502 02/21/19 1308  WBC 10.4  --   HGB 11.2* 12.9*  12.9*  HCT 35.4* 38.0*  38.0*  MCV 93  --   PLT 283  --    Cardiac Enzymes: No results for input(s): CKTOTAL, CKMB, CKMBINDEX, TROPONINI in the last 168 hours. CBG: No results for input(s): GLUCAP in the last 168 hours. Iron Studies: No results for input(s): IRON, TIBC, TRANSFERRIN, FERRITIN in the last 72 hours. Studies/Results: No results found.  ROS: As per HPI otherwise negative.   Physical Exam: Vitals:   02/21/19 1525 02/21/19 1530 02/21/19 1535 02/21/19 1540  BP: (!) 124/58 (!) 136/59 (!) 129/55 136/69  Pulse: 87 84 81 87  Resp: 18 17 18  (!) 21  Temp:      TempSrc:      SpO2: 95% 91% 95% 95%  Weight:      Height:         General: Pleasant, elderly male in NAD Head: Normocephalic, atraumatic, sclera non-icteric, mucus membranes are moist Neck: Supple. JVD not elevated. Lungs: Clear bilaterally to auscultation without wheezes, rales, or rhonchi. Breathing is unlabored. Heart: RRR with S1 S2 2/6 systolic M. No R/G Abdomen: Soft, non-tender, non-distended with normoactive bowel sounds. No rebound/guarding. No obvious abdominal masses. M-S:  Strength and tone appear normal for age. Lower extremities:without edema or ischemic changes, no open wounds  Neuro: Alert and oriented X 3. Moves all extremities spontaneously. Psych:  Responds to questions appropriately with a normal affect. Dialysis Access: R AVF + bruit.   Dialysis Orders: [facility] MWF (Clinic only open MWF. Will call for records tomorrow.)  Assessment/Plan: 1.  CAD-S/P PTCA to proximal and mid RCA. Will  be on DAPT post procedure.  2.  Severe Aortic Stenosis-Planning TAVR. Per primary.  3.  AFIB-not on coumadin D/T  GIB. ESRD patients can take Eliquis 2.5 mg PO if needed. Per primary. 4.  ESRD -  MWF HD tomorrow on schedule. K+4.6. No heparin.  5.  Hypertension/volume  - BP well controlled, no evidence of excess volume. Uses midodrine 10 mg PO on HD days. Wt 70.8 kg. UFG 1-2 liters.  6.  Anemia  -HGB 11.2 02/16/2019. No ESA needed.  7.  Metabolic bone disease - Ca 9.2. Continue binders. Get record from [facility] tomorrow.  8.  Nutrition - NPO at present awaiting sheath removal. Renal/Carb mod diet when able to eat.  9.  DM-per primary.    Rita H. Owens Shark, NP-C 02/21/2019, 3:51 PM  [employer] [phone]  Pt seen, examined and agree w A/P as above.  Kelly Splinter  MD 02/22/2019, 8:18 AM

## 2019-02-21 NOTE — Progress Notes (Addendum)
Site area: left groin   Site Prior to Removal: level 0/ooze   Pressure Applied For: 55 minutes  Bedrest Beginning at:1915   Manual:   yes by laura murphy rn    Patient Status During Pull:  stable   Post Pull Groin Site:  level 1   Post Pull Instructions Given:  yes   Post Pull Pulses Present: yes   Dressing Applied:  gauze and tegaderm    Comments:

## 2019-02-21 NOTE — Interval H&P Note (Signed)
History and Physical Interval Note:  02/21/2019 9:07 AM  Terry [name].  has presented today for surgery, with the diagnosis of Aortic stenosis.  The various methods of treatment have been discussed with the patient and family. After consideration of risks, benefits and other options for treatment, the patient has consented to  Procedure(s): RIGHT/LEFT HEART CATH AND CORONARY ANGIOGRAPHY (N/A) as a surgical intervention.  The patient's history has been reviewed, patient examined, no change in status, stable for surgery.  I have reviewed the patient's chart and labs.  Questions were answered to the patient's satisfaction.    Cath Lab Visit (complete for each Cath Lab visit)  Clinical Evaluation Leading to the Procedure:   ACS: No.  Non-ACS:    Anginal Classification: CCS III  Anti-ischemic medical therapy: Minimal Therapy (1 class of medications)  Non-Invasive Test Results: No non-invasive testing performed  Prior CABG: No previous CABG         Lauree Chandler

## 2019-02-22 ENCOUNTER — Other Ambulatory Visit: Payer: Self-pay

## 2019-02-22 ENCOUNTER — Encounter (HOSPITAL_COMMUNITY): Payer: Self-pay | Admitting: Cardiovascular Disease

## 2019-02-22 DIAGNOSIS — I200 Unstable angina: Secondary | ICD-10-CM | POA: Diagnosis not present

## 2019-02-22 DIAGNOSIS — D631 Anemia in chronic kidney disease: Secondary | ICD-10-CM | POA: Diagnosis not present

## 2019-02-22 DIAGNOSIS — I25110 Atherosclerotic heart disease of native coronary artery with unstable angina pectoris: Secondary | ICD-10-CM | POA: Diagnosis not present

## 2019-02-22 DIAGNOSIS — I480 Paroxysmal atrial fibrillation: Secondary | ICD-10-CM | POA: Diagnosis not present

## 2019-02-22 DIAGNOSIS — I120 Hypertensive chronic kidney disease with stage 5 chronic kidney disease or end stage renal disease: Secondary | ICD-10-CM | POA: Diagnosis not present

## 2019-02-22 DIAGNOSIS — E7800 Pure hypercholesterolemia, unspecified: Secondary | ICD-10-CM | POA: Diagnosis not present

## 2019-02-22 DIAGNOSIS — I509 Heart failure, unspecified: Secondary | ICD-10-CM | POA: Diagnosis not present

## 2019-02-22 DIAGNOSIS — E1122 Type 2 diabetes mellitus with diabetic chronic kidney disease: Secondary | ICD-10-CM | POA: Diagnosis not present

## 2019-02-22 DIAGNOSIS — N2581 Secondary hyperparathyroidism of renal origin: Secondary | ICD-10-CM | POA: Diagnosis not present

## 2019-02-22 DIAGNOSIS — I132 Hypertensive heart and chronic kidney disease with heart failure and with stage 5 chronic kidney disease, or end stage renal disease: Secondary | ICD-10-CM | POA: Diagnosis not present

## 2019-02-22 DIAGNOSIS — I350 Nonrheumatic aortic (valve) stenosis: Secondary | ICD-10-CM

## 2019-02-22 DIAGNOSIS — Z992 Dependence on renal dialysis: Secondary | ICD-10-CM | POA: Diagnosis not present

## 2019-02-22 DIAGNOSIS — Z20828 Contact with and (suspected) exposure to other viral communicable diseases: Secondary | ICD-10-CM | POA: Diagnosis not present

## 2019-02-22 DIAGNOSIS — N186 End stage renal disease: Secondary | ICD-10-CM | POA: Diagnosis not present

## 2019-02-22 DIAGNOSIS — E785 Hyperlipidemia, unspecified: Secondary | ICD-10-CM | POA: Diagnosis not present

## 2019-02-22 LAB — BASIC METABOLIC PANEL
Anion gap: 14 (ref 5–15)
BUN: 68 mg/dL — ABNORMAL HIGH (ref 8–23)
CO2: 24 mmol/L (ref 22–32)
Calcium: 8.2 mg/dL — ABNORMAL LOW (ref 8.9–10.3)
Chloride: 94 mmol/L — ABNORMAL LOW (ref 98–111)
Creatinine, Ser: 7.23 mg/dL — ABNORMAL HIGH (ref 0.61–1.24)
GFR calc Af Amer: 7 mL/min — ABNORMAL LOW (ref >60)
GFR calc non Af Amer: 6 mL/min — ABNORMAL LOW (ref >60)
Glucose, Bld: 106 mg/dL — ABNORMAL HIGH (ref 70–99)
Potassium: 4 mmol/L (ref 3.5–5.1)
Sodium: 132 mmol/L — ABNORMAL LOW (ref 135–145)

## 2019-02-22 LAB — HEPATITIS B SURFACE ANTIGEN: Hepatitis B Surface Ag: NEGATIVE

## 2019-02-22 LAB — CBC
HCT: 34.9 % — ABNORMAL LOW (ref 39.0–52.0)
Hemoglobin: 10.6 g/dL — ABNORMAL LOW (ref 13.0–17.0)
MCH: 30.1 pg (ref 26.0–34.0)
MCHC: 30.4 g/dL (ref 30.0–36.0)
MCV: 99.1 fL (ref 80.0–100.0)
Platelets: 237 10*3/uL (ref 150–400)
RBC: 3.52 MIL/uL — ABNORMAL LOW (ref 4.22–5.81)
RDW: 17.7 % — ABNORMAL HIGH (ref 11.5–15.5)
WBC: 8.7 10*3/uL (ref 4.0–10.5)
nRBC: 0 % (ref 0.0–0.2)

## 2019-02-22 MED ORDER — SODIUM CHLORIDE 0.9 % IV SOLN: 100.0000 mL | INTRAVENOUS | Status: DC | PRN

## 2019-02-22 MED ORDER — LIDOCAINE HCL (PF) 1 % IJ SOLN: 5.0000 mL | INTRAMUSCULAR | Status: DC | PRN

## 2019-02-22 MED ORDER — MIDODRINE HCL 5 MG PO TABS
ORAL_TABLET | ORAL | Status: AC
  Administered 2019-02-22: 10 mg via ORAL
  Filled 2019-02-22: qty 2

## 2019-02-22 MED ORDER — PENTAFLUOROPROP-TETRAFLUOROETH EX AERO: 1.0000 "application " | INHALATION_SPRAY | CUTANEOUS | Status: DC | PRN

## 2019-02-22 MED ORDER — LIDOCAINE-PRILOCAINE 2.5-2.5 % EX CREA
1.0000 "application " | TOPICAL_CREAM | CUTANEOUS | Status: DC | PRN
  Filled 2019-02-22: qty 5

## 2019-02-22 NOTE — Progress Notes (Addendum)
[facility] Progress Note   Subjective: Seen on HD. No specific complaints. Denies chest pain/SOB.   Objective Vitals:   02/22/19 0400 02/22/19 0500 02/22/19 0600 02/22/19 0830  BP: (!) 108/47 126/61 134/60 (!) 109/59  Pulse: 97 75 82 94  Resp: (!) 21 20 (!) 23   Temp: 98.4 F (36.9 C)     TempSrc: Axillary     SpO2: 94% 95% 93%   Weight:      Height:       Physical Exam General: Pleasant elderly male looks younger than stated age. Heart: S1,S2 regularly irregular. AFIB on monitor. Rate is controlled.  Lungs: Few scattered coarse breath sounds UL otherwise CTAB Abdomen: S, NT Extremities: No LE edema Dialysis Access: R AVF cannulated at present.    Additional Objective Labs: Basic Metabolic Panel: Recent Labs  Lab 02/16/19 1502 02/21/19 1308 02/22/19 0215  NA 141 132*  133* 132*  K 4.8 4.6  4.6 4.0  CL 97  --  94*  CO2 24  --  24  GLUCOSE 221*  --  106*  BUN 43*  --  68*  CREATININE 5.48*  --  7.23*  CALCIUM 9.2  --  8.2*   Liver Function Tests: No results for input(s): AST, ALT, ALKPHOS, BILITOT, PROT, ALBUMIN in the last 168 hours. No results for input(s): LIPASE, AMYLASE in the last 168 hours. CBC: Recent Labs  Lab 02/16/19 1502 02/21/19 1308 02/22/19 0215  WBC 10.4  --  8.7  HGB 11.2* 12.9*  12.9* 10.6*  HCT 35.4* 38.0*  38.0* 34.9*  MCV 93  --  99.1  PLT 283  --  237   Blood Culture    Component Value Date/Time   SDES EXPECTORATED SPUTUM 12/01/2017 2045   SDES EXPECTORATED SPUTUM 12/01/2017 2045   SPECREQUEST NONE 12/01/2017 2045   SPECREQUEST NONE Reflexed from [id number] 12/01/2017 2045   [place]  12/01/2017 2045   REPTSTATUS 12/01/2017 FINAL 12/01/2017 2045   REPTSTATUS 12/04/2017 FINAL 12/01/2017 2045    Cardiac Enzymes: No results for input(s): CKTOTAL, CKMB, CKMBINDEX, TROPONINI in the last 168 hours. CBG: No results for input(s): GLUCAP in the last 168 hours. Iron  Studies: No results for input(s): IRON, TIBC, TRANSFERRIN, FERRITIN in the last 72 hours. @lablastinr3 @ Studies/Results: No results found. Medications: . sodium chloride    . sodium chloride    . sodium chloride     . aspirin EC  81 mg Oral Daily  . atorvastatin  80 mg Oral QHS  . calcium acetate  1,334 mg Oral TID AC  . Chlorhexidine Gluconate Cloth  6 each Topical Q0600  . clopidogrel  75 mg Oral Daily  . metoprolol succinate  50 mg Oral Daily  . midodrine  10 mg Oral Q M,W,F-HD  . mometasone-formoterol  2 puff Inhalation BID  . pantoprazole  40 mg Oral BID AC  . sodium chloride flush  3 mL Intravenous Q12H     Dialysis Orders: [facility] MWF (Clinic only open MWF. Will call for records tomorrow.)  Assessment/Plan: 1.  CAD-S/P PTCA to proximal and mid RCA. Will be on DAPT post procedure. Per primary. 2.  Severe Aortic Stenosis-Planning TAVR. Per primary.  3.  AFIB-not on coumadin D/T GIB. ESRD patients can take Eliquis 2.5 mg PO bid if needed. Per primary. 4.  ESRD -  MWF HD today on schedule. K+4.0. No heparin.  5.  Hypertension/volume  - BP well controlled until start of  treatment. Usually requires 2 doses of midodrine 10 mg PO per tx. First dose given, SBP 80s. UFG 1.5 No edema.  6.  Anemia  -HGB 10.6 today. No ESA needed.  7.  Metabolic bone disease - Ca 9.2. Continue binders. Get records from [facility] tomorrow.  8.  Nutrition - NPO at present awaiting sheath removal. Renal/Carb mod diet when able to eat.  9.  DM-per primary.  10. H/O COPD-per primary  Terry H. Brown NP-C 02/22/2019, 8:51 AM  [facility] [phone]  Pt seen, examined and agree w A/P as above.  Kelly Splinter  MD 02/22/2019, 11:29 AM

## 2019-02-22 NOTE — Care Management CC44 (Signed)
Condition Code 44 Documentation Completed  Patient Details  Name: Terry [name]. MRN: [id number] Date of Birth: 02-18-1935   Condition Code 44 given:  Yes Patient signature on Condition Code 44 notice:  Yes Documentation of 2 MD's agreement:  Yes Code 44 added to claim:  Yes    Bethena Roys, RN 02/22/2019, 1:17 PM

## 2019-02-22 NOTE — Progress Notes (Signed)
Patient discharged home with wife.  Patient and wife instructed to stop along the way since they are driving 4 hours for patient to stand up and move around for a few minutes.  All discharge instructions given to patient and his wife.  Questions answered and support given.

## 2019-02-22 NOTE — Discharge Instructions (Signed)
Physical Activity With Heart Disease °Being active has many benefits, especially if you have heart disease. Physical activity can help you do more and feel healthier. Start slowly, and increase the amount of time you spend being active. Most adults should aim for physical activity that: °· Makes you breathe harder and raises your heart rate (aerobic activity). Try to get at least 150 minutes of aerobic activity each week. This is about 30 minutes each day, 5 days a week. °· Helps build muscle strength (strengthening activity). Do this at least 2 times a week. °Always talk with your health care provider before starting any new activity program or if you have any changes in your condition. °What are the benefits of physical activity? °When you have heart disease, physical activity can help: °· Lower your blood pressure. °· Lower your cholesterol. °· Control your weight. °· Improve your sleep. °· Help control your blood sugar. °· Improve your heart and lung function. °· Reduce your risk for blood clots (thrombophlebitis). °· Improve your energy level. °· Reduce stress. °What are some types of physical activity I could try? °There are many ways to be active. Talk with your health care provider about what types and intensity of activity is right for you. °Aerobic activity ° °Aerobic (cardiovascular) activity can be moderate or vigorous intensity, depending on how hard you are working. °Moderate-intensity activity includes: °· Walking. °· Slow bicycling. °· Water aerobics. °· Dancing. °· Light gardening or house work. °Vigorous-intensity activity includes: °· Jogging or running. °· Stair climbing. °· Swimming laps. °· Hiking uphill. °· Heavy gardening, such as digging trenches. ° °Strengthening activity °Strengthening activities work your muscles to build strength. Some examples include: °· Doing push-ups, sit-ups, or pull-ups. °· Lifting small weights. °· Using resistance bands. ° °Flexibility °Flexibility activities  lengthen your muscles to keep them flexible and less tight and improve your balance. Some examples include: °· Stretching. °· Yoga. °· Tai chi. °· Ballet barre. ° °Follow these instructions at home: °How to get started °· Talk with your health care provider about: °? What types of activities are safe for you. °? If you should check your pulse or take other precautions during physical activity. °· Get a calendar. Write down a schedule and plan for your new routine. °· Take time to find out what works for you. Consider: °? Joining a community program, such as a biking group, yoga class, local gym, or swimming pool membership. °? Be active on your own by downloading free workout applications on a smartphone or other devices, or by purchasing workout DVDs. °· If you have not been active, begin with sessions that last 10-15 minutes. Gradually work up to sessions that last 20-30 minutes, 5 times a week. Follow all of your health care provider's recommendations. °· Be patient with yourself. It takes time to build up strength and lung capacity. °Safety °· Exercise in an indoor, climate-controlled facility, as told by your health care provider. You may need to do this if: °? There are extreme outdoor conditions, such as heat, humidity, or cold. °? There is an air pollution advisory. Your local news, board of health, or hospital can provide information on air quality. °· Take extra precautions as told by your health care provider. This may include: °? Monitoring your heart rate. °? Avoiding heavy lifting. °? Understanding how your medicines can affect you during physical activity. Certain medicines may cause heat intolerance or changes in blood sugar. °? Slowing down to rest when you need to. °?   Keeping nitroglycerin spray and tablets with you at all times if you have angina. Use them as told to prevent and treat symptoms. °· Drink plenty of water before, during, and after physical activity. °· Know what symptoms may be signs  of a problem. Stop physical activity right away if you have any of these symptoms. °Get help right away if you have any of the following during exercise: °· Chest pain, shortness of breath, or feel very tired. °· Pain in the arm, shoulder, neck, or jaw. °· Feel weak, dizzy, or light-headed. °· An irregular heart rate, or your heart rate is greater than 100 beats per minute (bpm) before exercise. °These symptoms may represent a serious problem that is an emergency. Do not wait to see if the symptoms go away. Get medical help right away. Call your local emergency services (911 in the [place] Do not drive yourself to the hospital.  °Summary °· Physical activity has many benefits, especially if you have heart disease. °· Before starting an activity program, talk with your health care provider about how often to be active and what type of activity is safe for you. °· Your physical activity plan may include moderate or vigorous aerobic activity, strengthening activities, and flexibility. °· Know what symptoms may be signs of a problem. Stop physical activity right away and call emergency services (911 in the [place] if you have any of these symptoms. °This information is not intended to replace advice given to you by your health care provider. Make sure you discuss any questions you have with your health care provider. °Document Released: 01/03/2014 Document Revised: 06/30/2017 Document Reviewed: 06/30/2017 °Elsevier Patient Education © 2020 [employer] ° °

## 2019-02-22 NOTE — Discharge Summary (Addendum)
Discharge Summary    Patient ID: Rosser Collington.,  MRN: [id number], DOB/AGE: Jul 13, 1934 84 y.o.  Admit date: 02/21/2019 Discharge date: 02/22/2019  Primary Care Provider: Jamal Maes Primary Cardiologist: Lauree Chandler, MD  Discharge Diagnoses    Active Problems:   Severe aortic stenosis   Unstable angina (HCC)  Allergies Allergies  Allergen Reactions   Yellow Dyes (Non-Tartrazine) Other (See Comments)    Burns arms; Cough medications (tussionex and benzonatate ok) specific - yellow dye used for ophthalmology procedure    Levaquin [Levofloxacin In D5w] Itching   Levofloxacin Nausea And Vomiting    Diagnostic Studies/Procedures    Cath: 02/21/19   Prox RCA lesion is 90% stenosed.  Prox RCA to Mid RCA lesion is 95% stenosed.  Ost Cx to Prox Cx lesion is 20% stenosed.  Ost LM to Mid LM lesion is 20% stenosed.  Prox LAD to Mid LAD lesion is 30% stenosed.  Post intervention, there is a 40% residual stenosis.  Balloon angioplasty was performed.  Post intervention, there is a 60% residual stenosis.  Balloon angioplasty was performed.   1. Severe single vessel CAD with severe restenosis in the proximal and mid RCA stented segments. Balloon angioplasty of the proximal and mid stented segments. Residual moderate disease in both segments as I was unable to deliver large non-compliant balloons or a cutting balloon into the mid vessel.  2. Mild non-obstructive disease in the LAD and Circumflex 3. Severe aortic stenosis by echo (Cath data with mean gradient 17. 5 mmHg, peak to peak gradient 17 mmHg, AVA 1.2 cm2)  Recommendations: Will admit to telemetry post PCI. Continue DAPT with ASA and Plavix. Will plan TAVR scans over next few weeks. Likely d/c home tomorrow. He will need dialysis here tomorrow as he lives several hours away.   Diagnostic Dominance: Right  Intervention    _____________   History of Present Illness     84 yo male with history  of paroxysmal atrial fibrillation, HTN, HLD, prior CVA, COPD, CAD, ESRD on HD and moderate aortic stenosis who was referred to Dr. [name] for TAVR work up. He had been followed by cardiology in [place], [name] for many years until he moved to [place] in 2014. Cath records received from [facility] . Cardiac cath 11/30/1997. Mild distal Left main stenosis, 25% proximal LAD, minor irregularities Circumflex, 95% mid RCA stenosis treated with 3.5 x 32 mm bare metal stent. There was a ? Overlapping 3.5 x 18 mm bare metal stent more proximally. Echo 02/03/13 with LVEF=50-55%. He had normal carotid dopplers in [place] 2013. He was admitted to [facility] October 2015 with ruptured kidney cyst. Atrial fibrillation diagnosed in December 2015 at an office visit. He was initially started on Eliquis but progressed to ESRD so this was stopped. He was started on coumadin but had GI blood loss and severe bruising so this was stopped. He has been on low dose beta blocker therapy. He had been started on amiodarone but this was stopped. Low risk stress myoview September 2016.  He was admitted to [facility] in March 2019 with SOB. He was in atrial fib and treated for pneumonia and COPD exacerbation. Cardiac cath with severe RCA disease (70% ostial stenosis, 95% mid stenosis, 50% LAD proximal stenosis, Circumflex 40% mid stenosis. His vessels were all calcified. Echo March 2019 with LVEF=55%, severe AS with AVA 0.7 cm2, mean gradient 21 mmHg, mild LVH. He was transferred to [facility] and had rotational atherectomy of the  ostial and mid RCA and placement of a 3.0 x 28 mm drug eluting stent in the mid vessel and a 3.5 x 24 mm drug eluting stent in the ostium of the RCA. Echo at [facility] with aortic valve area of 1.0 cm2 He was seen by the TAVR team and it was felt that TAVR was not indicated at this time as his AS was only moderate. Mean gradient 25 mmHg. Echo here in September 2019 with normal LV systolic function, moderately  severe AS with mean gradient 20 mmHg, AVA around 1.0 cm2 and DVI 0.21. Hospitalized in November 2019 at [facility] in [place], [name] due to pneumonia and echo there showed moderate AS with mean gradient 20 mmHg and AVA 1.1 cm2. LV function was normal. He has PAD and left leg ischemia in July 2019 treated with left femoral endarterectomy, Dr. Donnetta Hutching.   Echo in the office on 8/27 with normal LV systolic function. The aortic valve leaflets are thickened and calcified. AVA 0.53cm2. Dimensionless index 0.19. Mean gradient 29 mmHg. The patient endorses worsened dyspnea. He can barely walk across the room without having to stop to catch his breath. He denies any chest pain, palpitations, lower extremity edema, orthopnea, PND, dizziness, near syncope or syncope. It is felt that he is not a good candidate for most procedures but would consider TAVR. He was set up for outpatient cardiac cath.   Hospital Course     Consultants: Nephrology   Underwent cath noted above with severe single vessel CAD with severe restenosis in the proximal and mid RCA stented segments. Balloon angioplasty of the proximal and mid stented segments. Residual moderate disease in both segments 2/2 to being unable to deliver large non-compliant balloons or a cutting balloon into the mid vessel. Had mild non-obstructive disease in the LAD and Lcx.  Severe aortic stenosis by echo (Cath data with mean gradient 17. 5 mmHg, peak to peak gradient 17 mmHg, AVA 1.2 cm2). Will plan to continue DAPT with ASA/plavix. Further TAVR work up as planned. Did developed a level 1 left femoral hematoma post cath. Eccyhmosis noted at site, but patient refused to allow for further assessment. Hgb with mild drop post cath. No bleeding noted at site, and no obvious hematoma. Precautions given. Underwent HD session during admission. Continued on home medications without significant changes. Consideration for cardiac rehab, though he lives in [name]. With  TAVR work up, would consider cardiac rehab referral after completed.   General: Well developed, well nourished, male appearing in no acute distress. Head: Normocephalic, atraumatic.  Neck: Supple without bruits, + JVD. Lungs:  Resp regular and unlabored, CTA. Heart: RRR, S1, S2, no S3, S4, harsh 4/6 systolic murmur; no rub. Abdomen: Soft, non-tender, non-distended with normoactive bowel sounds.  Extremities: No clubbing, cyanosis, edema. Distal pedal pulses are 2+ bilaterally. Left femoral cath site stable but with bruising, no obvious hematoma. Neuro: Alert and oriented X 3. Moves all extremities spontaneously. Psych: Normal affect.  [name] was seen by Dr. Gwenlyn Found and determined stable for discharge home. Follow up in the office has been arranged. Medications are listed below.   _____________  Discharge Vitals Blood pressure (!) 110/49, pulse 91, temperature 99.4 F (37.4 C), temperature source Oral, resp. rate (!) 21, height 5\' 8"  (1.727 m), weight 68.9 kg, SpO2 96 %.  Filed Weights   02/21/19 2000 02/22/19 0711 02/22/19 1127  Weight: 71.1 kg 69.4 kg 68.9 kg    Labs & Radiologic Studies  CBC Recent Labs    02/21/19 1308 02/22/19 0215  WBC  --  8.7  HGB 12.9*   12.9* 10.6*  HCT 38.0*   38.0* 34.9*  MCV  --  99.1  PLT  --  093   Basic Metabolic Panel Recent Labs    02/21/19 1308 02/22/19 0215  NA 132*   133* 132*  K 4.6   4.6 4.0  CL  --  94*  CO2  --  24  GLUCOSE  --  106*  BUN  --  68*  CREATININE  --  7.23*  CALCIUM  --  8.2*   Liver Function Tests No results for input(s): AST, ALT, ALKPHOS, BILITOT, PROT, ALBUMIN in the last 72 hours. No results for input(s): LIPASE, AMYLASE in the last 72 hours. Cardiac Enzymes No results for input(s): CKTOTAL, CKMB, CKMBINDEX, TROPONINI in the last 72 hours. BNP Invalid input(s): POCBNP D-Dimer No results for input(s): DDIMER in the last 72 hours. Hemoglobin A1C No results for input(s): HGBA1C in the last  72 hours. Fasting Lipid Panel No results for input(s): CHOL, HDL, LDLCALC, TRIG, CHOLHDL, LDLDIRECT in the last 72 hours. Thyroid Function Tests No results for input(s): TSH, T4TOTAL, T3FREE, THYROIDAB in the last 72 hours.  Invalid input(s): FREET3 _____________  No results found. Disposition   Pt is being discharged home today in good condition.  Follow-up Plans & Appointments    Follow-up Information    Burnell Blanks, MD Follow up.   Specialty: Cardiology Why: Office will call you with your follow up appts. Contact information: [address] [place] [address] [phone]          Discharge Instructions    Call MD for:  redness, tenderness, or signs of infection (pain, swelling, redness, odor or green/yellow discharge around incision site)   Complete by: As directed    Diet - low sodium heart healthy   Complete by: As directed    Discharge instructions   Complete by: As directed    Groin Site Care Refer to this sheet in the next few weeks. These instructions provide you with information on caring for yourself after your procedure. Your caregiver may also give you more specific instructions. Your treatment has been planned according to current medical practices, but problems sometimes occur. Call your caregiver if you have any problems or questions after your procedure. HOME CARE INSTRUCTIONS You may shower 24 hours after the procedure. Remove the bandage (dressing) and gently wash the site with plain soap and water. Gently pat the site dry.  Do not apply powder or lotion to the site.  Do not sit in a bathtub, swimming pool, or whirlpool for 5 to 7 days.  No bending, squatting, or lifting anything over 10 pounds (4.5 kg) as directed by your caregiver.  Inspect the site at least twice daily.  Do not drive home if you are discharged the same day of the procedure. Have someone else drive you.  You may drive 24 hours after the procedure unless  otherwise instructed by your caregiver.  What to expect: Any bruising will usually fade within 1 to 2 weeks.  Blood that collects in the tissue (hematoma) may be painful to the touch. It should usually decrease in size and tenderness within 1 to 2 weeks.  SEEK IMMEDIATE MEDICAL CARE IF: You have unusual pain at the groin site or down the affected leg.  You have redness, warmth, swelling, or pain at the groin site.  You have drainage (other than a small amount of blood on the dressing).  You have chills.  You have a fever or persistent symptoms for more than 72 hours.  You have a fever and your symptoms suddenly get worse.  Your leg becomes pale, cool, tingly, or numb.  You have heavy bleeding from the site. Hold pressure on the site. [name]  PLEASE DO NOT MISS ANY DOSES OF YOUR PLAVIX!!!!! Also keep a log of you blood pressures and bring back to your follow up appt. Please call the office with any questions.   Patients taking blood thinners should generally stay away from medicines like ibuprofen, Advil, Motrin, naproxen, and Aleve due to risk of stomach bleeding. You may take Tylenol as directed or talk to your primary doctor about alternatives.   Increase activity slowly   Complete by: As directed       Discharge Medications     Medication List    TAKE these medications   acetaminophen 500 MG tablet Commonly known as: TYLENOL Take 1,000 mg by mouth every 6 (six) hours as needed for mild pain or headache.   albuterol 108 (90 Base) MCG/ACT inhaler Commonly known as: VENTOLIN HFA Inhale 2 puffs into the lungs every 4 (four) hours as needed for wheezing or shortness of breath.   aspirin EC 81 MG tablet Take 81 mg by mouth daily.   atorvastatin 80 MG tablet Commonly known as: LIPITOR Take 1 tablet by mouth once daily What changed: when to take this   calcium acetate 667 MG capsule Commonly known as: PHOSLO Take 667-1,334 mg by mouth See admin instructions. Take 2 capsules (1334  mg) by moth 3 times daily with each meal & take 1 capsule (667 mg) by mouth with each snack   cetirizine 10 MG tablet Commonly known as: ZYRTEC Take 10 mg by mouth daily as needed for allergies.   clopidogrel 75 MG tablet Commonly known as: PLAVIX Take 1 tablet by mouth once daily   dextromethorphan-guaiFENesin 30-600 MG 12hr tablet Commonly known as: MUCINEX DM Take 1 tablet by mouth 2 (two) times daily as needed (cough/congestion.).   ethyl chloride spray Apply 1 application topically every Monday, Wednesday, and Friday.   furosemide 40 MG tablet Commonly known as: LASIX Take 40 mg by mouth 2 (two) times a week. Saturdays & Sundays ONLY   metoprolol succinate 50 MG 24 hr tablet Commonly known as: TOPROL-XL Take 1 tablet (50 mg total) by mouth 2 (two) times daily. Take with or immediately following a meal. What changed:   how much to take  when to take this  additional instructions   midodrine 10 MG tablet Commonly known as: PROAMATINE Take 10 mg by mouth See admin instructions. Take 10 mg by mouth on Mon/Wed/Fri at dialysis as needed for low blood pressure   OXYGEN Inhale 1 L into the lungs daily as needed (for shortness of breath).   oxymetazoline 0.05 % nasal spray Commonly known as: AFRIN Place 1 spray into both nostrils once as needed (to stop nose bleeds).   pantoprazole 40 MG tablet Commonly known as: PROTONIX Take 40 mg by mouth 2 (two) times daily before a meal. Breakfast & supper   predniSONE 10 MG tablet Commonly known as: DELTASONE Take 1 tablet (10 mg total) by mouth daily with breakfast.   Symbicort 160-4.5 MCG/ACT inhaler Generic drug: budesonide-formoterol Inhale 2 puffs by mouth twice daily        Acute coronary syndrome (MI, NSTEMI, STEMI, etc)  this admission?: No.     Outstanding Labs/Studies   Continued TAVR work up. Have asked that his CBC be checked with his HD session on 9/7. Give order prior to discharge.   Duration of  Discharge Encounter   Greater than 30 minutes including physician time.  Signed, Reino Bellis NP-C 02/22/2019, 12:45 PM  Agree with note by Reino Bellis NP-C  Mr. Aaron Edelman had right left heart cath yesterday by Dr. [name].  He has moderate to severe aortic stenosis and is symptomatic.  Prior stenting of his RCA and cath demonstrated in-stent restenosis in the proximal midportion both of which were angioplastied by Dr. [name].  He is being considered for TAVR.  His groin is somewhat tender and ecchymotic but I do not see hematoma.  He just finished hemodialysis this morning and stable for discharge.  He can follow-up with Dr. [name].  Lorretta Harp, M.D., [place], [facility], [place] [address]. [place], [place]  [address]  [phone] 02/22/2019 1:12 PM

## 2019-02-22 NOTE — Care Management Obs Status (Signed)
[facility] NOTIFICATION   Patient Details  Name: Terry [name]. MRN: [id number] Date of Birth: [birth year]   Medicare Observation Status Notification Given:  Yes    Bethena Roys, RN 02/22/2019, 1:17 PM

## 2019-02-22 NOTE — Plan of Care (Signed)
Patient ready for discharge today after having HD.  All discharge instructions went over with patient and questions answered.

## 2019-02-23 LAB — HEPATITIS B CORE ANTIBODY, TOTAL: Hep B Core Total Ab: NEGATIVE

## 2019-02-23 LAB — HEPATITIS B SURFACE ANTIBODY,QUALITATIVE: Hep B S Ab: NONREACTIVE

## 2019-02-24 ENCOUNTER — Telehealth: Payer: Self-pay | Admitting: Physician Assistant

## 2019-02-24 DIAGNOSIS — Z992 Dependence on renal dialysis: Secondary | ICD-10-CM | POA: Diagnosis not present

## 2019-02-24 DIAGNOSIS — N186 End stage renal disease: Secondary | ICD-10-CM | POA: Diagnosis not present

## 2019-02-24 DIAGNOSIS — Z23 Encounter for immunization: Secondary | ICD-10-CM | POA: Diagnosis not present

## 2019-02-24 NOTE — Telephone Encounter (Signed)
   The patient and wife called the answering service after-hours today. Patient had a heart catheterization on 02/21/19. He indicates he had issues post cath requiring team to hold pressure. Over the last few days he has had a lot of bruising and significant pain at his groin site. Tylenol is not helping and he is requesting stronger pain medication. It concerns me that he is having this degree of pain at cath site several days out from a catheterization - this is not normal and can indicate a potential issue with the healing of the vessel such as pseudoaneurysm or hematoma. I advised they go to the emergency room for evaluation. They live in [place] so wife would plan to go to [employer]. She put patient on the phone who sounded disgruntled and stated that we must like seeing people in pain because he was yelling in pain at the time of his groin holding and did not receive pain medicine at that time. I relayed my concern over this being an arterial stick. Patient remained stoic at the end of the conversation. Wife indicates she will try and talk him into going. She verbalized understanding and gratitude.  Charlie Pitter

## 2019-02-27 DIAGNOSIS — Z992 Dependence on renal dialysis: Secondary | ICD-10-CM | POA: Diagnosis not present

## 2019-02-27 DIAGNOSIS — Z23 Encounter for immunization: Secondary | ICD-10-CM | POA: Diagnosis not present

## 2019-02-27 DIAGNOSIS — N186 End stage renal disease: Secondary | ICD-10-CM | POA: Diagnosis not present

## 2019-02-27 NOTE — Telephone Encounter (Signed)
Thx Dayna, I tried to get him to stay an additional day because of his groin but he was adamant about going home.  JJB

## 2019-02-28 NOTE — Telephone Encounter (Signed)
Thanks Dayna.

## 2019-03-01 DIAGNOSIS — Z992 Dependence on renal dialysis: Secondary | ICD-10-CM | POA: Diagnosis not present

## 2019-03-01 DIAGNOSIS — Z23 Encounter for immunization: Secondary | ICD-10-CM | POA: Diagnosis not present

## 2019-03-01 DIAGNOSIS — N186 End stage renal disease: Secondary | ICD-10-CM | POA: Diagnosis not present

## 2019-03-02 DIAGNOSIS — D62 Acute posthemorrhagic anemia: Secondary | ICD-10-CM | POA: Diagnosis not present

## 2019-03-02 DIAGNOSIS — K921 Melena: Secondary | ICD-10-CM | POA: Diagnosis not present

## 2019-03-03 DIAGNOSIS — Z992 Dependence on renal dialysis: Secondary | ICD-10-CM | POA: Diagnosis not present

## 2019-03-03 DIAGNOSIS — N186 End stage renal disease: Secondary | ICD-10-CM | POA: Diagnosis not present

## 2019-03-03 DIAGNOSIS — Z23 Encounter for immunization: Secondary | ICD-10-CM | POA: Diagnosis not present

## 2019-03-06 DIAGNOSIS — N186 End stage renal disease: Secondary | ICD-10-CM | POA: Diagnosis not present

## 2019-03-06 DIAGNOSIS — Z992 Dependence on renal dialysis: Secondary | ICD-10-CM | POA: Diagnosis not present

## 2019-03-06 DIAGNOSIS — Z23 Encounter for immunization: Secondary | ICD-10-CM | POA: Diagnosis not present

## 2019-03-08 DIAGNOSIS — Z992 Dependence on renal dialysis: Secondary | ICD-10-CM | POA: Diagnosis not present

## 2019-03-08 DIAGNOSIS — N186 End stage renal disease: Secondary | ICD-10-CM | POA: Diagnosis not present

## 2019-03-08 DIAGNOSIS — Z23 Encounter for immunization: Secondary | ICD-10-CM | POA: Diagnosis not present

## 2019-03-10 ENCOUNTER — Encounter (HOSPITAL_COMMUNITY): Payer: Medicare PPO

## 2019-03-10 DIAGNOSIS — Z992 Dependence on renal dialysis: Secondary | ICD-10-CM | POA: Diagnosis not present

## 2019-03-10 DIAGNOSIS — N186 End stage renal disease: Secondary | ICD-10-CM | POA: Diagnosis not present

## 2019-03-10 DIAGNOSIS — Z23 Encounter for immunization: Secondary | ICD-10-CM | POA: Diagnosis not present

## 2019-03-12 DIAGNOSIS — Z992 Dependence on renal dialysis: Secondary | ICD-10-CM | POA: Diagnosis not present

## 2019-03-12 DIAGNOSIS — I5031 Acute diastolic (congestive) heart failure: Secondary | ICD-10-CM | POA: Diagnosis not present

## 2019-03-12 DIAGNOSIS — L0390 Cellulitis, unspecified: Secondary | ICD-10-CM | POA: Diagnosis not present

## 2019-03-12 DIAGNOSIS — J449 Chronic obstructive pulmonary disease, unspecified: Secondary | ICD-10-CM | POA: Diagnosis not present

## 2019-03-13 ENCOUNTER — Telehealth (HOSPITAL_COMMUNITY): Payer: Self-pay | Admitting: Emergency Medicine

## 2019-03-13 ENCOUNTER — Encounter: Payer: Self-pay | Admitting: Physician Assistant

## 2019-03-13 DIAGNOSIS — Z992 Dependence on renal dialysis: Secondary | ICD-10-CM | POA: Diagnosis not present

## 2019-03-13 DIAGNOSIS — N186 End stage renal disease: Secondary | ICD-10-CM | POA: Diagnosis not present

## 2019-03-13 DIAGNOSIS — Z23 Encounter for immunization: Secondary | ICD-10-CM | POA: Diagnosis not present

## 2019-03-13 NOTE — Telephone Encounter (Signed)
error 

## 2019-03-14 ENCOUNTER — Ambulatory Visit (HOSPITAL_COMMUNITY)
Admission: RE | Admit: 2019-03-14 | Discharge: 2019-03-14 | Disposition: A | Discharge status: Home / Self Care | Payer: Medicare PPO | Source: Ambulatory Visit | Attending: Cardiovascular Disease | Admitting: Cardiovascular Disease

## 2019-03-14 ENCOUNTER — Ambulatory Visit: Payer: Medicare PPO | Attending: Cardiovascular Disease | Admitting: Physical Therapy

## 2019-03-14 ENCOUNTER — Encounter (HOSPITAL_COMMUNITY): Payer: Medicare PPO

## 2019-03-14 ENCOUNTER — Encounter: Payer: Self-pay | Admitting: Physical Therapy

## 2019-03-14 ENCOUNTER — Other Ambulatory Visit: Payer: Self-pay

## 2019-03-14 ENCOUNTER — Encounter: Payer: Self-pay | Admitting: Thoracic Surgery (Cardiothoracic Vascular Surgery)

## 2019-03-14 ENCOUNTER — Institutional Professional Consult (permissible substitution) (INDEPENDENT_AMBULATORY_CARE_PROVIDER_SITE_OTHER): Payer: Medicare PPO | Admitting: Thoracic Surgery (Cardiothoracic Vascular Surgery)

## 2019-03-14 VITALS — BP 151/69 | HR 88 | Temp 97.5°F | Resp 18 | Ht 67.0 in | Wt 155.0 lb

## 2019-03-14 DIAGNOSIS — I350 Nonrheumatic aortic (valve) stenosis: Secondary | ICD-10-CM

## 2019-03-14 DIAGNOSIS — I700 Atherosclerosis of aorta: Secondary | ICD-10-CM | POA: Diagnosis not present

## 2019-03-14 DIAGNOSIS — R2689 Other abnormalities of gait and mobility: Secondary | ICD-10-CM | POA: Diagnosis not present

## 2019-03-14 MED ORDER — IOHEXOL 350 MG/ML SOLN
100.0000 mL | Freq: Once | INTRAVENOUS | Status: AC | PRN
  Administered 2019-03-14: 100 mL via INTRAVENOUS

## 2019-03-14 NOTE — Progress Notes (Signed)
HEART AND [facility]GERY CONSULTATION REPORT  Primary Cardiologist is Lauree Chandler, MD PCP is Jamal Maes, MD  Chief Complaint  Patient presents with   Aortic Stenosis    TAVR EVAL and review all required completed studies/procedures    HPI:  Patient is an 84 year old obese white male with complex past medical history including history of aortic stenosis, coronary artery disease status post multiple previous PCI and stenting procedures of the right coronary artery, chronic diastolic congestive heart failure, hypertension, end-stage renal disease on hemodialysis, COPD with home oxygen use, degenerative arthritis with limited mobility, peripheral arterial disease, persistent atrial fibrillation not currently on warfarin or DOAC anticoagulation, and recent upper GI bleed with been referred for surgical consultation to discuss treatment options for management of severe aortic stenosis.  Patient's cardiac history dates back more than 20 years ago when he first presented with ischemic heart disease.  He underwent PCI and stenting of the right coronary artery at that time.  He developed paroxysmal atrial fibrillation in December 2015 and was initially anticoagulated using warfarin but had trouble tolerating it.  He was switched to Eliquis but this was stopped because of progressive renal failure.  In March 2019 he was hospitalized in [place].  Echocardiogram revealed moderate to severe aortic stenosis with mean transvalvular gradient reported 21 mmHg but aortic valve area calculated only 0.7 cm.  Diagnostic cardiac catheterization performed at that time revealed moderate nonobstructive disease in the left anterior descending coronary artery and the left circumflex but high-grade stenosis in the right coronary artery.  He underwent rotational atherectomy of the ostial and mid right coronary artery with stenting using  multiple drug-eluting stents at [facility] in [place].  In July 2019 he developed acute left lower extremity ischemia for which she was treated by Dr. early with femoral endarterectomy.  Later that year he was hospitalized at [facility] in [place] due to pneumonia.  Echocardiogram performed at that time was reported to demonstrate moderate aortic stenosis.  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  He has not had any recurrence.  The patient was recently seen in follow-up by Dr. [name].  He describes stable but progressive symptoms of exertional shortness of breath.  Follow-up transthoracic echocardiogram performed February 16, 2019 revealed significant progression in the severity of aortic stenosis with preserved left ventricular systolic function.  Peak velocity across the aortic valve was reported 3.5 m/s corresponding to mean transvalvular gradient estimated 29 mmHg but aortic valve area calculated only 0.53 cm with very low DVI reported 0.19.  Ejection fraction was estimated 55 to 60%.  Patient underwent diagnostic cardiac catheterization February 21, 2019 and revealed severe single-vessel coronary artery disease including moderate nonobstructive disease in the left coronary circulation but high-grade 90% proximal and 95% mid stenosis of the right coronary artery.  Pulmonary artery pressures were mildly elevated.  Peak to peak and mean transvalvular gradients were reported 17 and 17.46 mmHg, respectively.  The patient underwent successful primary balloon angioplasty of the high-grade stenosis in the right coronary artery.  CT angiography was performed and the patient was referred for surgical consultation.  Patient is married and lives with his wife in [place].  They also have a home  locally in [place].  The patient has become progressively  sedentary in the last few years.  He has been on hemodialysis since 2016.  He currently dialyzes at [facility] in [place] on a Monday Wednesday Friday schedule via right upper arm primary AV fistula.  He reports no problems with dialysis treatments recently.  He ambulates within his house without mechanical assistance but uses a cane or wheelchair whenever he has to ambulate longer distances.  Up until recently he has been intermittently driving an automobile.  He has problems with severe arthritis in his right knee and cannot walk very far.  He describes progressive exertional shortness of breath.  He gets short of breath with moderate and low level activity.  He denies resting shortness of breath, PND, or lower extremity edema.  Uses home oxygen therapy at night.  He cannot lay flat in bed.  He denies any chest pain or chest tightness either with activity or at rest.  He has not had dizzy spells or syncope.   Past Medical History:  Diagnosis Date   Anxiety    Arthritis    "hands, knees" (03/15/2017)   CAD (coronary artery disease) [place], [place]   CHF (congestive heart failure) ([place]) 09/2012   COPD (chronic obstructive pulmonary disease) ([place])    ESRD (end stage renal disease) on dialysis ([place])    "MWF; [facility], [place]" (03/15/2017)   HCAP (healthcare-associated pneumonia) 03/15/2017   HLD (hyperlipidemia)    Hypertension    On home oxygen therapy    "1L typically at night" (03/15/2017)   Osteoarthritis 10/22/2012   PAF (paroxysmal atrial fibrillation) ([place])    Stroke ([place]) 2012   denies residual on 03/15/2017    Past Surgical History:  Procedure Laterality Date   ABDOMINAL AORTOGRAM N/A 12/16/2017   Procedure: ABDOMINAL AORTOGRAM;  Surgeon: Conrad Playa Fortuna, MD;  Location: [facility];  Service: Cardiovascular;  Laterality: N/A;   APPENDECTOMY  1984   AV FISTULA PLACEMENT Right  05/21/2015   Procedure: Right Arm Brachiocephalic ARTERIOVENOUS (AV) FISTULA CREATION;  Surgeon: Angelia Mould, MD;  Location: [facility];  Service: Vascular;  Laterality: Right;   CATARACT EXTRACTION W/ INTRAOCULAR LENS  IMPLANT, BILATERAL Bilateral    COLECTOMY  2012   partial colon removed - twisted bowel   CORONARY ANGIOPLASTY WITH STENT PLACEMENT  1999   2 stents   CORONARY BALLOON ANGIOPLASTY N/A 02/21/2019   Procedure: CORONARY BALLOON ANGIOPLASTY;  Surgeon: Burnell Blanks, MD;  Location: [facility];  Service: Cardiovascular;  Laterality: N/A;   CYSTOSCOPY/RETROGRADE/URETEROSCOPY Bilateral 04/23/2014   Procedure: CYSTOSCOPY BILATERAL RETROGRADE,  LEFT URETEROSCOPY, LEFT STENT PLACEMENT;  Surgeon: Raynelle Bring, MD;  Location: [facility];  Service: Urology;  Laterality: Bilateral;   ENDARTERECTOMY FEMORAL Left 12/29/2017   Procedure: Left Femoral ENDARTERECTOMY with Patch Angioplasty using Xenosure patch;  Surgeon: Rosetta Posner, MD;  Location: [facility];  Service: Vascular;  Laterality: Left;   EYE SURGERY Left    "surgery to clean off lens after cataract OR"   INGUINAL HERNIA REPAIR Right    LOWER EXTREMITY ANGIOGRAPHY Bilateral 12/16/2017   Procedure: LOWER EXTREMITY ANGIOGRAPHY;  Surgeon: Conrad Magnolia, MD;  Location: [facility];  Service: Cardiovascular;  Laterality: Bilateral;   RIGHT/LEFT HEART CATH AND CORONARY ANGIOGRAPHY N/A 02/21/2019   Procedure: RIGHT/LEFT HEART CATH AND CORONARY ANGIOGRAPHY;  Surgeon: Burnell Blanks, MD;  Location: [facility];  Service: Cardiovascular;  Laterality: N/A;    Family History  Problem Relation Age of Onset  Cancer Mother        Breast   Heart disease Father    Heart attack Father    Parkinson's disease Brother     Social History   Socioeconomic History   Marital status: Married    Spouse name: Not on file   Number of children: 3   Years of education: Not on file   Highest education  level: Not on file  Occupational History   Occupation: retired-worked in Writer: [employer] resource strain: Not on file   Food insecurity    Worry: Not on file    Inability: Not on file   Transportation needs    Medical: Not on file    Non-medical: Not on file  Tobacco Use   Smoking status: Former Smoker    Packs/day: 1.00    Years: 49.00    Pack years: 49.00    Types: Cigarettes, Pipe, Cigars    Quit date: 02/21/2000    Years since quitting: 19.0   Smokeless tobacco: Former Systems developer    Types: Chew   Tobacco comment: "chewed when he was a kid"  Substance and Sexual Activity   Alcohol use: Yes    Comment: 03/15/2017 "nothing since the 1960's"   Drug use: No   Sexual activity: Never  Lifestyle   Physical activity    Days per week: Not on file    Minutes per session: Not on file   Stress: Not on file  Relationships   Social connections    Talks on phone: Not on file    Gets together: Not on file    Attends religious service: Not on file    Active member of club or organization: Not on file    Attends meetings of clubs or organizations: Not on file    Relationship status: Not on file   Intimate partner violence    Fear of current or ex partner: Not on file    Emotionally abused: Not on file    Physically abused: Not on file    Forced sexual activity: Not on file  Other Topics Concern   Not on file  Social History Narrative   Work or School: retired [employer] - [employer]      Home Situation: lives with wife in [place] regular; poor diet          Current Outpatient Medications  Medication Sig Dispense Refill   acetaminophen (TYLENOL) 500 MG tablet Take 1,000 mg by mouth every 6 (six) hours as needed for mild pain or headache.      albuterol (PROVENTIL HFA;VENTOLIN HFA) 108 (90 Base) MCG/ACT inhaler Inhale 2 puffs into the lungs every 4 (four) hours as needed for  wheezing or shortness of breath. 1 Inhaler 0   aspirin EC 81 MG tablet Take 81 mg by mouth daily.     atorvastatin (LIPITOR) 80 MG tablet Take 1 tablet by mouth once daily (Patient taking differently: Take 80 mg by mouth at bedtime. ) 90 tablet 3   calcium acetate (PHOSLO) 667 MG capsule Take 667-1,334 mg by mouth See admin instructions. Take 2 capsules (1334 mg) by moth 3 times daily with each meal & take 1 capsule (667 mg) by mouth with each snack     cetirizine (ZYRTEC) 10 MG tablet Take 10 mg by mouth daily as needed for allergies.      clopidogrel (  PLAVIX) 75 MG tablet Take 1 tablet by mouth once daily (Patient taking differently: Take 75 mg by mouth daily. ) 90 tablet 2   dextromethorphan-guaiFENesin (MUCINEX DM) 30-600 MG 12hr tablet Take 1 tablet by mouth 2 (two) times daily as needed (cough/congestion.).      ethyl chloride spray Apply 1 application topically every Monday, Wednesday, and Friday.      furosemide (LASIX) 40 MG tablet Take 40 mg by mouth 2 (two) times a week. Saturdays & Sundays ONLY     metoprolol succinate (TOPROL-XL) 50 MG 24 hr tablet Take 1 tablet (50 mg total) by mouth 2 (two) times daily. Take with or immediately following a meal. (Patient taking differently: Take 25-50 mg by mouth See admin instructions. Take 1 tablet (50 mg) by mouth on Sundays, Tuesdays, Thursdays, & Saturdays in the morning Take 0.5 tablet (25 mg) by mouth on Mondays, Wednesdays, Fridays, & Saturdays nights.) 180 tablet 3   midodrine (PROAMATINE) 10 MG tablet Take 10 mg by mouth See admin instructions. Take 10 mg by mouth on Mon/Wed/Fri at dialysis as needed for low blood pressure     OXYGEN Inhale 1 L into the lungs daily as needed (for shortness of breath).      oxymetazoline (AFRIN) 0.05 % nasal spray Place 1 spray into both nostrils once as needed (to stop nose bleeds).      pantoprazole (PROTONIX) 40 MG tablet Take 40 mg by mouth 2 (two) times daily before a meal. Breakfast & supper      predniSONE (DELTASONE) 10 MG tablet Take 1 tablet (10 mg total) by mouth daily with breakfast. 100 tablet 0   SYMBICORT 160-4.5 MCG/ACT inhaler Inhale 2 puffs by mouth twice daily 11 g 4   No current facility-administered medications for this visit.     Allergies  Allergen Reactions   Yellow Dyes (Non-Tartrazine) Other (See Comments)    Burns arms; Cough medications (tussionex and benzonatate ok) specific - yellow dye used for ophthalmology procedure    Levaquin [Levofloxacin In D5w] Itching   Levofloxacin Nausea And Vomiting      Review of Systems:   General:  normal appetite, decreased energy, no weight gain, no weight loss, no fever  Cardiac:  no chest pain with exertion, no chest pain at rest, +SOB with exertion, no resting SOB, no PND, + orthopnea, no palpitations, + arrhythmia, + atrial fibrillation, + LE edema, no dizzy spells, no syncope  Respiratory:  + shortness of breath, + home oxygen, no productive cough, no dry cough, no bronchitis, no wheezing, no hemoptysis, no asthma, no pain with inspiration or cough, no sleep apnea, no CPAP at night  GI:   no difficulty swallowing, no reflux, no frequent heartburn, no hiatal hernia, no abdominal pain, no constipation, no diarrhea, no hematochezia, no hematemesis, + melena  GU:   Anuric, no urinary tract infection, no hematuria, no enlarged prostate, no kidney stones, + kidney disease  Vascular:  no pain suggestive of claudication, no pain in feet, + leg cramps, no varicose veins, no DVT, no non-healing foot ulcer  Neuro:   no stroke, + TIA's, no seizures, no headaches, no temporary blindness one eye,  no slurred speech, no peripheral neuropathy, no chronic pain, + instability of gait, no memory/cognitive dysfunction  Musculoskeletal: + arthritis, + joint swelling, no myalgias, + difficulty walking, limited mobility   Skin:   + rash, no itching, no skin infections, no pressure sores or ulcerations  Psych:   no anxiety, no  depression, no nervousness, no unusual recent stress  Eyes:   + blurry vision, no floaters, no recent vision changes, + wears glasses or contacts  ENT:   no hearing loss, no loose or painful teeth, edentulous with full dentures, last saw dentist 2019  Hematologic:  + easy bruising, + abnormal bleeding, no clotting disorder, no frequent epistaxis  Endocrine:  no diabetes, does not check CBG's at home           Physical Exam:   BP (!) 151/69 (BP Location: Left Arm, Patient Position: Sitting, Cuff Size: Normal)    Pulse 88    Temp (!) 97.5 F (36.4 C)    Resp 18    Ht 5\' 7"  (1.702 m)    Wt 155 lb (70.3 kg)    SpO2 93% Comment: RA   BMI 24.28 kg/m   General:  Moderately obese, chronically ill-appearing  HEENT:  Unremarkable   Neck:   no JVD, no bruits, no adenopathy   Chest:   clear to auscultation, symmetrical breath sounds, no wheezes, no rhonchi   CV:   RRR, grade III/VI blowing systolic murmur heard best at LL,  no diastolic murmur  Abdomen:  soft, non-tender, no masses   Extremities:  warm, well-perfused, pulses diminished with palpable pulsatile mass left groin and large area ecchymosis left groin and thigh, no LE edema  Rectal/GU  Deferred  Neuro:   Grossly non-focal and symmetrical throughout  Skin:   Clean and dry, no rashes, no breakdown   Diagnostic Tests:  EKG: Atrial fibrillation with controlled ventricular rate       ECHOCARDIOGRAM REPORT       Patient Name:   Terry [name]. Date of Exam: 02/16/2019 Medical Rec #:  [id number]         Height:       67.0 in Accession #:    [id number]        Weight:       152.5 lb Date of Birth:  02-02-1935         BSA:          1.80 m Patient Age:    8 years          BP:           133/60 mmHg Patient Gender: M                 HR:           84 bpm. Exam Location:  [facility]    Procedure: 2D Echo, Cardiac Doppler and Color Doppler  Indications:    I35.0 Aortic Stenosis   History:        Patient has prior history of  Echocardiogram examinations, most                 recent 03/08/2018. Risk Factors: Hypertension. Shortness of                 breath. Stroke. End stage renal disease. Atrial fibrillation.                 Congestive heart failure. COPD. Coroanry arterty disease.                 Pneumonia.   Sonographer:    Wilford Sports Rodgers-Jones RDCS Referring Phys: [place]    1. The left ventricle has normal systolic function, with an ejection fraction of 55-60%. The cavity size was normal. Left ventricular diastolic function could  not be evaluated secondary to atrial fibrillation.  2. Left atrial size was moderately dilated.  3. The mitral valve is degenerative. Moderate thickening of the mitral valve leaflet. There is moderate mitral annular calcification present.  4. The aortic valve is tricuspid Severely thickening of the aortic valve. Severe calcifcation of the aortic valve. Aortic valve regurgitation is mild to moderate by color flow Doppler. There is Moderate stenosis of the aortic valve, with a calculated  valve area of 0.53 cm. AV Vmax: 350.60 cm/s. AV Mean Grad: 29.2 mmHg. LVOT/AV VTI ratio: 0.19. Suspect that AS is more on the severe side given reduced dimensionless index.  5. The aorta is normal unless otherwise noted.  6. Compared to prior echo, the mean AVG has increased from 20 to 69mmHg.    Left Ventricle: The left ventricle has normal systolic function, with an ejection fraction of 55-60%. The cavity size was normal. There is no increase in left ventricular wall thickness. Left ventricular diastolic function could not be evaluated  secondary to atrial fibrillation.  Right Ventricle: The right ventricle has mildly reduced systolic function. The cavity was normal. There is no increase in right ventricular wall thickness. Right ventricular systolic pressure could not be assessed.  Left Atrium: Left atrial size was moderately dilated.  Right Atrium: Right  atrial size was normal in size.  Interatrial Septum: No atrial level shunt detected by color flow Doppler.  Pericardium: There is no evidence of pericardial effusion.  Mitral Valve: The mitral valve is degenerative in appearance. Moderate thickening of the mitral valve leaflet. There is moderate mitral annular calcification present. Mitral valve regurgitation is trivial by color flow Doppler.  Tricuspid Valve: The tricuspid valve is normal in structure. Tricuspid valve regurgitation is trivial by color flow Doppler.  Aortic Valve: The aortic valve is tricuspid Severely thickening of the aortic valve. Severe calcifcation of the aortic valve. Aortic valve regurgitation is mild to moderate by color flow Doppler. There is Moderate stenosis of the aortic valve, with a  calculated valve area of 0.53 cm.  Pulmonic Valve: The pulmonic valve was normal in structure. Pulmonic valve regurgitation is not visualized by color flow Doppler.  Aorta: The aorta is normal unless otherwise noted.  Venous: The inferior vena cava measures 1.96 cm, is normal in size with greater than 50% respiratory variability.    +--------------+--------++  LEFT VENTRICLE            +--------------+--------++  PLAX 2D                   +--------------+--------++  LVIDd:         5.10 cm    +--------------+--------++  LVIDs:         3.60 cm    +--------------+--------++  LV PW:         1.20 cm    +--------------+--------++  LV IVS:        0.80 cm    +--------------+--------++  LVOT diam:     1.90 cm    +--------------+--------++  LV SV:         69 ml      +--------------+--------++  LV SV Index:   38.23      +--------------+--------++  LVOT Area:     2.84 cm   +--------------+--------++                            +--------------+--------++  +---------------+----------++  RIGHT VENTRICLE              +---------------+----------++  RV Basal diam:  3.20 cm      +---------------+----------++  RV S  prime:     11.07 cm/s   +---------------+----------++  TAPSE (M-mode): 1.3 cm       +---------------+----------++  +---------------+-------++-----------++  LEFT ATRIUM              Index         +---------------+-------++-----------++  LA diam:        4.20 cm  2.33 cm/m    +---------------+-------++-----------++  LA Vol (A2C):   78.7 ml  43.67 ml/m   +---------------+-------++-----------++  LA Vol (A4C):   68.1 ml  37.79 ml/m   +---------------+-------++-----------++  LA Biplane Vol: 76.9 ml  42.67 ml/m   +---------------+-------++-----------++ +------------+---------++-----------++  RIGHT ATRIUM            Index         +------------+---------++-----------++  RA Pressure: 3.00 mmHg                +------------+---------++-----------++  RA Area:     19.20 cm                +------------+---------++-----------++  RA Volume:   57.60 ml   31.96 ml/m   +------------+---------++-----------++  +------------------+------------++  AORTIC VALVE                      +------------------+------------++  AV Area (Vmax):    0.53 cm       +------------------+------------++  AV Area (Vmean):   0.59 cm       +------------------+------------++  AV Area (VTI):     0.53 cm       +------------------+------------++  AV Vmax:           350.60 cm/s    +------------------+------------++  AV Vmean:          255.400 cm/s   +------------------+------------++  AV VTI:            0.720 m        +------------------+------------++  AV Peak Grad:      49.2 mmHg      +------------------+------------++  AV Mean Grad:      29.2 mmHg      +------------------+------------++  LVOT Vmax:         65.63 cm/s     +------------------+------------++  LVOT Vmean:        52.933 cm/s    +------------------+------------++  LVOT VTI:          0.134 m        +------------------+------------++  LVOT/AV VTI ratio: 0.19           +------------------+------------++  AR PHT:            523 msec        +------------------+------------++   +-------------+-------++  AORTA                   +-------------+-------++  Ao Root diam: 3.30 cm   +-------------+-------++  +--------------+-----------++ +---------------+---------++  MV E velocity: 134.25 cm/s    TRICUSPID VALVE             +--------------+-----------++ +---------------+---------++                                Estimated RAP:  3.00 mmHg                                 +---------------+---------++                                 +--------------+-------+  SHUNTS                                                +--------------+-------+                                Systemic VTI:  0.13 m                                 +--------------+-------+                                Systemic Diam: 1.90 cm                                +--------------+-------+  +---------+-------+  IVC                +---------+-------+  IVC diam: 1.96 cm  +---------+-------+    Fransico Him MD Electronically signed by Fransico Him MD Signature Date/Time: 02/16/2019/1:27:59 PM     CORONARY BALLOON ANGIOPLASTY  RIGHT/LEFT HEART CATH AND CORONARY ANGIOGRAPHY  Conclusion    Prox RCA lesion is 90% stenosed.  Prox RCA to Mid RCA lesion is 95% stenosed.  Ost Cx to Prox Cx lesion is 20% stenosed.  Ost LM to Mid LM lesion is 20% stenosed.  Prox LAD to Mid LAD lesion is 30% stenosed.  Post intervention, there is a 40% residual stenosis.  Balloon angioplasty was performed.  Post intervention, there is a 60% residual stenosis.  Balloon angioplasty was performed.   1. Severe single vessel CAD with severe restenosis in the proximal and mid RCA stented segments. Balloon angioplasty of the proximal and mid stented segments. Residual moderate disease in both segments as I was unable to deliver large non-compliant balloons or a cutting balloon into the mid vessel.  2. Mild non-obstructive disease in the LAD and  Circumflex 3. Severe aortic stenosis by echo (Cath data with mean gradient 17. 5 mmHg, peak to peak gradient 17 mmHg, AVA 1.2 cm2)  Recommendations: Will admit to telemetry post PCI. Continue DAPT with ASA and Plavix. Will plan TAVR scans over next few weeks. Likely d/c home tomorrow. He will need dialysis here tomorrow as he lives several hours away.    Recommendations  Antiplatelet/Anticoag Will continue workup for TAVR. Continue DAPT with ASA and Plavix.  Indications  Coronary artery disease involving native coronary artery of native heart with unstable angina pectoris (HCC) [I25.110 (ICD-10-CM)]  Severe aortic stenosis [I35.0 (ICD-10-CM)]  Procedural Details  Technical Details Indication: 84 yo male with history of paroxysmal atrial fibrillation, HTN, HLD, prior CVA, COPD, CAD, ESRD on HD and moderate aortic stenosis here today for cardiac cath . He had been followed by cardiology in [place], [name] for many years until he moved to [place] in 2014. Cath [facility] . Cardiac cath 11/30/1997. Mild distal Left main stenosis, 25% proximal LAD, minor irregularities Circumflex, 95% mid RCA stenosis treated with 3.5 x 32 mm bare metal stent. There was a ? Overlapping 3.5 x 18 mm bare metal stent more proximally. Echo 02/03/13 with LVEF=50-55%. He had normal carotid dopplers in [employer]  2013. He was admitted to [facility] October 2015 with ruptured kidney cyst. Atrial fibrillation diagnosed in December 2015 at an office visit. He was initially started on Eliquis but progressed to ESRD so this was stopped. He was started on coumadin but had GI blood loss and severe bruising so this was stopped. He has been on low dose beta blocker therapy. He had been started on amiodarone but this was stopped. Low risk stress myoview September 2016.  He was admitted to [facility] in March 2019 with SOB. He was in atrial fib and treated for pneumonia and COPD exacerbation. Cardiac  cath with severe RCA disease (70% ostial stenosis, 95% mid stenosis, 50% LAD proximal stenosis, Circumflex 40% mid stenosis. His vessels were all calcified. Echo March 2019 with LVEF=55%, severe AS with AVA 0.7 cm2, mean gradient 21 mmHg, mild LVH. He was transferred to [facility] and had rotational atherectomy of the ostial and mid RCA and placement of a 3.0 x 28 mm drug eluting stent in the mid vessel and a 3.5 x 24 mm drug eluting stent in the ostium of the RCA. Echo at [facility] with aortic valve area of 1.0 cm2 He was seen by the TAVR team and it was felt that TAVR was not indicated at this time as his AS was only moderate. Mean gradient 25 mmHg. Echo here in September 2019 with normal LV systolic function, moderately severe AS with mean gradient 20 mmHg, AVA around 1.0 cm2 and DVI 0.21. Hospitalized in November 2019 at [facility] in [place], [name] due to pneumonia and echo there showed moderate AS with mean gradient 20 mmHg and AVA 1.1 cm2. LV function was normal. He has PAD and left leg ischemia in July 2019 treated with left femoral endarterectomy, Dr. Donnetta Hutching.   His echo now shows severe AS. He is having progressive dyspnea with exertion. TAVR workup in progress.   Procedure: The risks, benefits, complications, treatment options, and expected outcomes were discussed with the patient. The patient and/or family concurred with the proposed plan, giving informed consent. The patient was brought to the cath lab after IV hydration was given. The patient was sedated with Versed and Fentanyl. The left groin was prepped and draped in the usual manner. Using the modified Seldinger access technique, a 5 French sheath was placed in the left femoral artery using u/s guidance. Standard diagnostic catheters were used to perform selective coronary angiography. I crossed the aortic valve with an AL-2 catheter and a straight wire. LV pressures measured.   PCI Note: Severe restenosis in the stented segment in the proximal  RCA and in the stented segment of the mid RCA. He was given IV heparin. ACT over 300. I then upsized the sheath to a long 6 [place] sheath. I then had considerable difficulty engaging the ostium of the RCA due to the fact that the old stent extended out into the aorta. After trying multiple guides, I was able to get the JR4 guide near the ostium and then directed a Whisper wire down the vessel. I then placed a Guideliner extension catheter over the wire but could not pass this into the RCA. I used a 3.0 x 12 mm Ricardo balloon to dilate the proximal RCA stented segment but could not pass this balloon into the mid vessel. I then dilated the mid RCA restenotic segment with a 2.0 x 8 mm balloon x 2. I was unable to deliver any larger balloons to the mid vessel. The vessel is difficult to  navigate due to heavy calcification in the mid segment. A 2.5 x 8 mm balloon would not pass into the mid segment. I dilated the proximal stented segment with this balloon on the way out.   There were no immediate complications. The patient was taken to the recovery area in stable condition.   Estimated blood loss <50 mL.   During this procedure medications were administered to achieve and maintain moderate conscious sedation while the patient's heart rate, blood pressure, and oxygen saturation were continuously monitored and I was present face-to-face 100% of this time.  Medications (Filter: Administrations occurring from 02/21/19 1220 to 02/21/19 1454) Medication Rate/Dose/Volume Action  Date Time   fentaNYL (SUBLIMAZE) injection (mcg) 50 mcg Given 02/21/19 1244   Total dose as of 02/21/19 1454 25 mcg Given 1415   75 mcg        midazolam (VERSED) injection (mg) 1 mg Given 02/21/19 1244   Total dose as of 02/21/19 1454 1 mg Given 1330   3 mg 1 mg Given 1415   lidocaine (PF) (XYLOCAINE) 1 % injection (mL) 20 mL Given 02/21/19 1255   Total dose as of 02/21/19 1454        20 mL        heparin injection (Units) 10,000 Units  Given 02/21/19 1318   Total dose as of 02/21/19 1454 3,000 Units Given 1418   13,000 Units        iohexol (OMNIPAQUE) 350 MG/ML injection (mL) 170 mL Given 02/21/19 1435   Total dose as of 02/21/19 1454        170 mL        Heparin (Porcine) in NaCl 1000-0.9 UT/500ML-% SOLN (mL) 500 mL Given 02/21/19 1420   Total dose as of 02/21/19 1454 500 mL Given 1421   1,500 mL 500 mL Given 1422   Sedation Time  Sedation Time Physician-1: 1 hour 48 minutes 44 seconds  Contrast  Medication Name Total Dose  iohexol (OMNIPAQUE) 350 MG/ML injection 170 mL    Radiation/Fluoro  Fluoro time: 40.6 (min) DAP: 20254 (mGycm2) Cumulative Air Kerma: 270 (mGy)  Complications  Complications documented before study signed (02/21/2019 3:06 PM)   RIGHT/LEFT HEART CATH AND CORONARY ANGIOGRAPHY  None Documented by Burnell Blanks, MD 02/21/2019 2:49 PM  Date Found: 02/21/2019  Time Range: Intraprocedure      Coronary Findings  Diagnostic Dominance: Right Left Main  Ost LM to Mid LM lesion 20% stenosed  Ost LM to Mid LM lesion is 20% stenosed.  Left Anterior Descending  Prox LAD to Mid LAD lesion 30% stenosed  Prox LAD to Mid LAD lesion is 30% stenosed. The lesion is calcified.  Left Circumflex  Vessel is large.  Ost Cx to Prox Cx lesion 20% stenosed  Ost Cx to Prox Cx lesion is 20% stenosed. The lesion is calcified.  Right Coronary Artery  Vessel is large.  Prox RCA lesion 90% stenosed  Prox RCA lesion is 90% stenosed. The lesion was previously treated using a drug eluting stent at an unknown date.  Prox RCA to Mid RCA lesion 95% stenosed  Prox RCA to Mid RCA lesion is 95% stenosed. The lesion was previously treated using a stent (unknown type) .  Intervention  Prox RCA lesion  Angioplasty  Balloon angioplasty was performed.  Post-Intervention Lesion Assessment  The intervention was successful. Pre-interventional TIMI flow is 3. Post-intervention TIMI flow is 3. No complications  occurred at this lesion.  There is a 40% residual stenosis post  intervention.  Prox RCA to Mid RCA lesion  Angioplasty  Balloon angioplasty was performed.  Post-Intervention Lesion Assessment  The intervention was successful. Pre-interventional TIMI flow is 3. Post-intervention TIMI flow is 3. No complications occurred at this lesion.  There is a 60% residual stenosis post intervention.  Coronary Diagrams  Diagnostic Dominance: Right  Intervention   Implants   No implant documentation for this case.  Syngo Images  Show images for CARDIAC CATHETERIZATION  Images on Long Term Storage  Show images for Shamon, [place]. "[name]"   Link to Procedure Log  Procedure Log    Hemo Data   Most Recent Value  Fick Cardiac Output 6.63 L/min  Fick Cardiac Output Index 3.6 (L/min)/BSA  Aortic Mean Gradient 17.46 mmHg  Aortic Peak Gradient 17 mmHg  Aortic Valve Area 1.24  Aortic Value Area Index 0.67 cm2/BSA  RA A Wave 15 mmHg  RA V Wave 17 mmHg  RA Mean 16 mmHg  RV Systolic Pressure 50 mmHg  RV Diastolic Pressure 12 mmHg  RV EDP 13 mmHg  PA Systolic Pressure 49 mmHg  PA Diastolic Pressure 25 mmHg  PA Mean 34 mmHg  PW A Wave 22 mmHg  PW V Wave 21 mmHg  PW Mean 20 mmHg  AO Systolic Pressure 427 mmHg  AO Diastolic Pressure 51 mmHg  AO Mean 75 mmHg  LV Systolic Pressure 062 mmHg  LV Diastolic Pressure 11 mmHg  LV EDP 17 mmHg  AOp Systolic Pressure 376 mmHg  AOp Diastolic Pressure 52 mmHg  AOp Mean Pressure 84 mmHg  LVp Systolic Pressure 283 mmHg  LVp Diastolic Pressure 12 mmHg  LVp EDP Pressure 16 mmHg  QP/QS 1  TPVR Index 9.44 HRUI  TSVR Index 21.11 HRUI  PVR SVR Ratio 0.23  TPVR/TSVR Ratio 0.45         Cardiac TAVR CT  TECHNIQUE: The patient was scanned on a [employer]. A 120 kV retrospective scan was triggered in the descending thoracic aorta at 111 HU's. Gantry rotation speed was 250 msecs and collimation was .6 mm. No beta blockade or  nitro were given. The 3D data set was reconstructed in 5% intervals of the R-R cycle. Systolic and diastolic phases were analyzed on a dedicated work station using MPR, MIP and VRT modes. The patient received 80 cc of contrast.  FINDINGS: Aortic Valve: Trileaflet aortic valve with severely thickened and calcified aortic leaflets and severely restricted leaflets opening, there are no calcifications extending into the LVOT.  Aorta: Normal size with severe atherosclerotic plaque predominantly in the ascending aorta and almost circumferential calcifications. No dissection.  Sinotubular Junction: 29 x 29 mm  Ascending Thoracic Aorta: 33 x 32 mm  Aortic Arch: 24 x 23 mm  Descending Thoracic Aorta: 25 x 23 mm  Sinus of Valsalva Measurements:  Non-coronary: 33 mm  Right -coronary: 33 mm  Left -coronary: 33 m  Coronary Artery Height above Annulus:  Left Main: 15 mm  Right Coronary: 21 mm  Virtual Basal Annulus Measurements:  Maximum/Minimum Diameter: 28.8 x 23.5 mm  Mean Diameter: 25.6 mm  Perimeter: 82.2 mm  Area: 514 mm2  Optimum Fluoroscopic Angle for Delivery: LAO 10 CAO 9  IMPRESSION: 1. Trileaflet aortic valve with severely thickened and calcified aortic leaflets and severely restricted leaflets opening, there are no calcifications extending into the LVOT. Aortic valve calcium score 2503 consistent with severe aortic stenosis. Annular measurements suitable for delivery of a 26 mm Edwards-SAPIEN Ultra valve.  2. Sufficient coronary  to annulus distance.  3. Optimum Fluoroscopic Angle for Delivery: LAO 10 CAO 9  4. No thrombus in the left atrial appendage.   Electronically Signed   By: Ena Dawley   On: 03/14/2019 11:49        CT ANGIOGRAPHY CHEST, ABDOMEN AND PELVIS  TECHNIQUE: Multidetector CT imaging through the chest, abdomen and pelvis was performed using the standard protocol during bolus administration  of intravenous contrast. Multiplanar reconstructed images and MIPs were obtained and reviewed to evaluate the vascular anatomy.  CONTRAST:  173mL OMNIPAQUE IOHEXOL 350 MG/ML SOLN  COMPARISON:  Chest CT 07/16/2017. CT the abdomen and pelvis 04/13/2014.  FINDINGS: CTA CHEST FINDINGS  Cardiovascular: Heart size is borderline enlarged. There is no significant pericardial fluid, thickening or pericardial calcification. There is aortic atherosclerosis, as well as atherosclerosis of the great vessels of the mediastinum and the coronary arteries, including calcified atherosclerotic plaque in the left main, left anterior descending, left circumflex and right coronary arteries. Severe thickening calcification of the aortic valve. Moderate thickening calcification of the mitral valve and mitral annulus.  Mediastinum/Lymph Nodes: No pathologically enlarged mediastinal or hilar lymph nodes. Esophagus is unremarkable in appearance. No axillary lymphadenopathy.  Lungs/Pleura: No suspicious appearing pulmonary nodules or masses are noted. No acute consolidative airspace disease. No pleural effusions. Scattered areas of linear scarring are noted in the lungs bilaterally. Partial collapse of the trachea, suggesting tracheomalacia.  Musculoskeletal/Soft Tissues: There are no aggressive appearing lytic or blastic lesions noted in the visualized portions of the skeleton.  CTA ABDOMEN AND PELVIS FINDINGS  Hepatobiliary: Liver has a shrunken appearance and nodular contour, indicative of underlying cirrhosis. Multiple subcentimeter low-attenuation lesions scattered throughout the liver, too small to characterize, but favored to represent tiny cysts. No larger more suspicious appearing hepatic lesions. No intra or extrahepatic biliary ductal dilatation. Tiny calcified gallstone lying dependently in the gallbladder. No findings to suggest an acute cholecystitis at this time.  Pancreas:  No pancreatic mass. No pancreatic ductal dilatation. No pancreatic or peripancreatic fluid collections or inflammatory changes.  Spleen: Unremarkable.  Adrenals/Urinary Tract: Mild bilateral renal atrophy. Innumerable low-attenuation lesions scattered throughout both kidneys, largest of which are compatible with simple cysts. Smaller lesions are too small to definitively characterize, but are also likely to represent cysts. No definite suspicious renal lesions confidently identified on today's examination. No hydroureteronephrosis. Urinary bladder is nearly completely decompressed, but otherwise unremarkable in appearance. Bilateral adrenal glands are normal in appearance.  Stomach/Bowel: Normal appearance of the stomach. No pathologic dilatation of small bowel or colon. Several colonic diverticulae are noted, particularly in the region of the sigmoid colon, without surrounding inflammatory changes to suggest an acute diverticulitis at this time. The appendix is not confidently identified and may be surgically absent. Regardless, there are no inflammatory changes noted adjacent to the cecum to suggest the presence of an acute appendicitis at this time.  Vascular/Lymphatic: Aortic atherosclerosis, with vascular findings and measurements pertinent to potential TAVR procedure, as detailed below. Notably, this includes complete occlusion of the right common femoral artery. The left superficial femoral artery is also completely occluded. 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 The inferior aspect of this pseudoaneurysm appears thrombosed, while the superior aspect of the straight avid arterial phase enhancement.  No lymphadenopathy noted in the  abdomen or pelvis.  Reproductive: Prostate gland and seminal vesicles are unremarkable in appearance.  Other: No significant volume of ascites.  No pneumoperitoneum.  Musculoskeletal: There are no aggressive appearing lytic or blastic lesions noted in the visualized portions of the skeleton.  VASCULAR MEASUREMENTS PERTINENT TO TAVR:  AORTA:  Minimal Aortic Diameter-19 x 15 mm  Severity of Aortic Calcification-severe  RIGHT PELVIS:  Right Common Iliac Artery -  Minimal Diameter-3.6 x 5.6 mm  Tortuosity-mild  Calcification-severe  Right External Iliac Artery -  Minimal Diameter-4.7 x 4.0 mm  Tortuosity-moderate  Calcification- moderate to severe  Right Common Femoral Artery -  Minimal Diameter- obstructed  Tortuosity-mild  Calcification-severe  LEFT PELVIS:  Left Common Iliac Artery -  Minimal Diameter-7.6 x 5.7 mm  Tortuosity-mild  Calcification-severe  Left External Iliac Artery -  Minimal Diameter-5.8 x 2.8 mm  Tortuosity-moderate  Calcification-moderate  Left Common Femoral Artery -  Minimal Diameter-4.4 x 4.2 mm  Tortuosity-mild  Calcification-mild  Review of the MIP images confirms the above findings.  IMPRESSION: 1. Vascular findings and measurements pertinent to potential TAVR procedure, as detailed above. 2. Severe thickening calcification of the aortic valve, compatible with the reported clinical history of severe aortic stenosis. 3. 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 This measures 3.5 x 3.2 x 4.6 cm and is partially thrombosed inferiorly, however, the superior aspect of the pseudoaneurysm remains patent demonstrating avid arterial phase hyperenhancement. 4. Aortic atherosclerosis, in addition to left main and 3 vessel coronary artery disease. 5. 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 6. The appearance of the kidneys suggests cystic  disease of hemodialysis. 7. Morphologic changes in the liver indicative of underlying cirrhosis. 8. Cholelithiasis without evidence of acute cholecystitis at this time. 9. Mild colonic diverticulosis without evidence of acute diverticulitis at this time. 10. Additional incidental findings, as above.  These results were called by telephone at the time of interpretation on 03/14/2019 at 11:30 am to provider [facility], who verbally acknowledged these results.   Electronically Signed   By: Vinnie Langton M.D.   On: 03/14/2019 11:38   STS Risk Calculator  Procedure: AVR + CAB  Risk of Mortality: 17.537%  Renal Failure: NA  Permanent Stroke: 3.971%  Prolonged Ventilation: 38.927%  DSW Infection: 0.656%  Reoperation: 9.450%  Morbidity or Mortality: 51.293%  Short Length of Stay: 4.838%  Long Length of Stay: 40.072%     Impression:  Patient has stage D severe symptomatic aortic stenosis.  He describes a long history of symptoms of exertional shortness of breath and fatigue consistent with chronic diastolic congestive heart failure, [place] Heart Association functional class III.  Symptoms are clearly multifactorial and related to the patient's aortic stenosis as well as his underlying ischemic heart disease, longstanding hypertension, end-stage renal disease, persistent atrial fibrillation, and severe COPD.  I have personally reviewed the patient's recent transthoracic echocardiogram, diagnostic cardiac catheterization, and CT angiograms.  Echocardiogram reveals severe aortic stenosis.  The aortic valve is trileaflet.  There is severe thickening, calcification, and restricted leaflet mobility involving all 3 leaflets.  Although the peak velocity across the aortic valve did not reach 4.0 m/s and mean transvalvular gradient was estimated only 29 mmHg, the DVI is quite low as is the stroke-volume index.  Diagnostic cardiac catheterization revealed severe single-vessel  coronary artery disease involving the right coronary artery with moderate nonobstructive disease in the left coronary circulation.  The patient underwent successful primary  balloon angioplasty of the right coronary artery yielding a satisfactory result for the time being.  I agree the patient would also benefit from aortic valve replacement.  Cardiac gated CT angiogram of the heart reveals anatomical characteristics consistent with severe aortic stenosis suitable for treatment using transcatheter heart valve replacement.  CTA of the chest, abdomen and pelvis reveals extensive atherosclerotic disease with extreme disease and calcification involving the entire thoracic aorta, consistent with essentially a porcelain aorta.  Moreover, the patient has severe aortoiliac disease and does not have adequate pelvic vascular access to facilitate transfemoral approach for transcatheter aortic valve replacement.  The patient does appear to have adequate access via left subclavian or left carotid approach.  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  I would not consider this patient a candidate for conventional surgical aortic valve replacement with or without coronary artery bypass grafting under any circumstances.  Options include long-term palliative medical therapy versus transcatheter aortic valve replacement via alternative access approach.   Plan:  The patient and his wife were counseled at length regarding treatment alternatives for management of severe symptomatic aortic stenosis. Alternative approaches such as conventional aortic valve replacement, transcatheter aortic valve replacement, and continued medical therapy without intervention were compared and contrasted at length.  The risks associated with conventional surgical aortic valve replacement were discussed in detail, as were  expectations for post-operative convalescence, and why I would not consider this patient a candidate for conventional surgery under any circumstances.  Issues specific to transcatheter aortic valve replacement were discussed including questions about long term valve durability, the potential for paravalvular leak, possible increased risk of need for permanent pacemaker placement, and other technical complications related to the procedure itself.  Long-term prognosis with medical therapy was discussed. This discussion was placed in the context of the patient's own specific clinical presentation and past medical history.  All of their questions have been addressed.  The patient hopes to proceed with transcatheter aortic valve replacement via either left subclavian or left carotid approach within the next few weeks.  As a next step we will establish a plan for management of the patient's false aneurysm of the left groin.  Following the decision to proceed with transcatheter aortic valve replacement, a discussion has been held regarding what types of management strategies would be attempted intraoperatively in the event of life-threatening complications, including whether or not the patient would be considered a candidate for the use of cardiopulmonary bypass and/or conversion to open sternotomy for attempted surgical intervention.  The patient has been advised of a variety of complications that might develop including but not limited to risks of death, stroke, paravalvular leak, aortic dissection or other major vascular complications, aortic annulus rupture, device embolization, cardiac rupture or perforation, mitral regurgitation, acute myocardial infarction, arrhythmia, heart block or bradycardia requiring permanent pacemaker placement, congestive heart failure, respiratory failure, renal failure, pneumonia, infection, other late complications related to structural valve deterioration or migration, or other  complications that might ultimately cause a temporary or permanent loss of functional independence or other long term morbidity.  The patient provides full informed consent for the procedure as described and all questions were answered.    I spent in excess of 90 minutes during the conduct of this office consultation and >50% of this time involved direct face-to-face encounter with the patient for counseling and/or coordination of their care.    Valentina Gu.  Roxy Manns, MD 03/14/2019 4:07 PM

## 2019-03-14 NOTE — Progress Notes (Signed)
Carotid artery duplex completed. Refer to "CV Proc" under chart review to view preliminary results.  03/14/2019 9:48 AM Maudry Mayhew, MHA, RVT, RDCS, RDMS

## 2019-03-14 NOTE — Therapy (Signed)
[place], [name], [address] Phone: [phone]   Fax:  [phone]  Physical Therapy Evaluation  Patient Details  Name: Terry [name]. MRN: [id number] Date of Birth: 21-Jan-1935 Referring Provider (PT): Lauree Chandler MD   Encounter Date: 03/14/2019  PT End of Session - 03/14/19 1404    Visit Number  1    Number of Visits  1    Date for PT Re-Evaluation  03/14/19    PT Start Time  0240    PT Stop Time  1348    PT Time Calculation (min)  33 min    Activity Tolerance  Patient tolerated treatment well    Behavior During Therapy  [facility] for tasks assessed/performed       Past Medical History:  Diagnosis Date  . Anxiety   . Arthritis    "hands, knees" (03/15/2017)  . CAD (coronary artery disease) [place], [name]  . CHF (congestive heart failure) ([place]) 09/2012  . COPD (chronic obstructive pulmonary disease) ([place])   . ESRD (end stage renal disease) on dialysis [facility])    "MWF; [facility], [place]" (03/15/2017)  . HCAP (healthcare-associated pneumonia) 03/15/2017  . HLD (hyperlipidemia)   . Hypertension   . On home oxygen therapy    "1L typically at night" (03/15/2017)  . Osteoarthritis 10/22/2012  . PAF (paroxysmal atrial fibrillation) ([place])   . Stroke [facility]) 2012   denies residual on 03/15/2017    Past Surgical History:  Procedure Laterality Date  . ABDOMINAL AORTOGRAM N/A 12/16/2017   Procedure: ABDOMINAL AORTOGRAM;  Surgeon: Conrad Eastvale, MD;  Location: [place] CV LAB;  Service: Cardiovascular;  Laterality: N/A;  . APPENDECTOMY  1984  . AV FISTULA PLACEMENT Right 05/21/2015   Procedure: Right Arm Brachiocephalic ARTERIOVENOUS (AV) FISTULA CREATION;  Surgeon: Angelia Mould, MD;  Location: [place];  Service: Vascular;  Laterality: Right;  . CATARACT EXTRACTION W/ INTRAOCULAR LENS  IMPLANT, BILATERAL Bilateral   . COLECTOMY  2012   partial colon removed - twisted bowel   . CORONARY ANGIOPLASTY WITH STENT PLACEMENT  1999   2 stents  . CORONARY BALLOON ANGIOPLASTY N/A 02/21/2019   Procedure: CORONARY BALLOON ANGIOPLASTY;  Surgeon: Burnell Blanks, MD;  Location: [place] CV LAB;  Service: Cardiovascular;  Laterality: N/A;  . CYSTOSCOPY/RETROGRADE/URETEROSCOPY Bilateral 04/23/2014   Procedure: CYSTOSCOPY BILATERAL RETROGRADE,  LEFT URETEROSCOPY, LEFT STENT PLACEMENT;  Surgeon: Raynelle Bring, MD;  Location: [facility];  Service: Urology;  Laterality: Bilateral;  . ENDARTERECTOMY FEMORAL Left 12/29/2017   Procedure: Left Femoral ENDARTERECTOMY with Patch Angioplasty using Xenosure patch;  Surgeon: Rosetta Posner, MD;  Location: [place];  Service: Vascular;  Laterality: Left;  . EYE SURGERY Left    "surgery to clean off lens after cataract OR"  . INGUINAL HERNIA REPAIR Right   . LOWER EXTREMITY ANGIOGRAPHY Bilateral 12/16/2017   Procedure: LOWER EXTREMITY ANGIOGRAPHY;  Surgeon: Conrad Kronenwetter, MD;  Location: [place] CV LAB;  Service: Cardiovascular;  Laterality: Bilateral;  . RIGHT/LEFT HEART CATH AND CORONARY ANGIOGRAPHY N/A 02/21/2019   Procedure: RIGHT/LEFT HEART CATH AND CORONARY ANGIOGRAPHY;  Surgeon: Burnell Blanks, MD;  Location: [place] CV LAB;  Service: Cardiovascular;  Laterality: N/A;    There were no vitals filed for this visit.   Subjective Assessment - 03/14/19 1324    Subjective  pt is a 84 y.o M with CC of SOB and COPD that has been going on for for 3  years with progressive worsening. . Reports pain in the R knee limited walking endurnace reporting it is due to OA.    Patient Stated Goals  to get heart better    Currently in Pain?  Yes    Pain Score  0-No pain   at worst 10/10   Pain Orientation  Right    Pain Descriptors / Indicators  Aching;Sore    Pain Type  Chronic pain    Pain Onset  More than a month ago    Pain Frequency  Intermittent    Aggravating Factors   walking / standing    Pain Relieving Factors  sitting an  dresting         [facility] PT Assessment - 03/14/19 0001      Assessment   Medical Diagnosis  Severe Aortic Stenosis    Referring Provider (PT)  Lauree Chandler MD    Hand Dominance  Left    Prior Therapy  no      Precautions   Precautions  None      Restrictions   Weight Bearing Restrictions  No      Balance Screen   Has the patient fallen in the past 6 months  No      [place] residence    Living Arrangements  Spouse/significant other    Available Help at Discharge  Family    Type of [place] to enter    Entrance Stairs-Number of Steps  5    [place] reach both    [place]  One level    [place] - 2 wheels;Bedside commode      ROM / Strength   AROM / PROM / Strength  AROM;Strength      AROM   Overall AROM   Within functional limits for tasks performed      Strength   Overall Strength  Within functional limits for tasks performed    Overall Strength Comments  overall bil UE 4-/5, R knee extenesoin 3+/5 due to pain    Right Hand Grip (lbs)  19    Left Hand Grip (lbs)  44      Ambulation/Gait   Ambulation/Gait  Yes    Assistive device  Rolling walker    Gait Pattern  Step-through pattern;Decreased stride length;Decreased trunk rotation;Trunk flexed;Trendelenburg;Antalgic    Gait Comments  pt reports he is only able to ambulate 100 ft in 1:45  with 02 at 86% and HR 122 during the 6 min walk test.        [facility] Pre-Surgical Assessment - 03/14/19 0001    5 Meter Walk Test- trial 1  13 sec    5 Meter Walk Test- trial 2  14 sec.     5 Meter Walk Test- trial 3  0 sec.   pt states he could only perform 2 test   5 meter walk test average  9 sec    4 Stage Balance Test tolerated for:   10 sec.    4 Stage Balance Test Position  2    Sit To Stand Test- trial 1  0 sec.    ADL/IADL Independent with:  --    ADL/IADL Needs Assistance with:  Bathing;Dressing;Meal  prep;Finances;Yard work    ADL/IADL Therapist, sports Index  Moderately frail    6 Minute Walk- Baseline  yes    BP (mmHg)  153/80  HR (bpm)  93    02 Sat (%RA)  94 %    Modified Borg Scale for Dyspnea  1- Very mild shortness of breath    Perceived Rate of Exertion (Borg)  13- Somewhat hard    6 Minute Walk Post Test  yes    BP (mmHg)  163/83    HR (bpm)  122    02 Sat (%RA)  86 %    Modified Borg Scale for Dyspnea  5- Strong or hard breathing    Perceived Rate of Exertion (Borg)  13- Somewhat hard    Aerobic Endurance Distance Walked  100    Endurance additional comments  pt is 92.69% limited compared to age related norm              Objective measurements completed on examination: See above findings.                           Plan - 03/14/19 1405    Clinical Impression Statement  see assessment in note    Stability/Clinical Decision Making  Stable/Uncomplicated    Clinical Decision Making  Low    PT Frequency  One time visit    PT Next Visit Plan  Pre-TAVR evaluation    Consulted and Agree with Plan of Care  Patient      Clinical Impression Statement: Pt is a 84 yo M presenting to OP PT for evaluation prior to possible TAVR surgery due to severe aortic stenosis. Pt reports onset of SOB, general fatigue  approximately 3 years ago. Symptoms are limiting endurance, walking and balance. Pt presents with functional  ROM and limited UE and R knee strength, poor balance and is assessed as high at high fall risk 4 stage balance test, limited walking speed and limitd aerobic endurance per 6 minute walk test. Pt ambulated 100 feet in 1:45 before requesting a seated rest beak lasting the remainder of the 6 min walk test. At time of rest, patient's HR was 122 bpm and O2 was 86 on room air. Pt reported 5/10 shortness of breath on modified scale for dyspnea.Pt ambulated a total of 100 feet in 6 minute walk. SOB, fatigue and R knee pain increased significantly with 6 minute  walk test. Based on the Short Physical Performance Battery, patient has a frailty rating of 2/12 with </= 5/12 considered frail.    Patient demonstrated the following deficits and impairments:     Visit Diagnosis: Other abnormalities of gait and mobility     Problem List Patient Active Problem List   Diagnosis Date Noted  . Unstable angina ([place]) 02/21/2019  . Severe aortic stenosis   . PAD (peripheral artery disease) ([place]) 12/29/2017  . Aortoiliac occlusive disease ([place]) 12/16/2017  . Aortic valve stenosis   . Drowsiness 11/29/2017  . COPD exacerbation ([place])   . Acute respiratory failure ([place]) 11/23/2017  . Anemia   . Coronavirus infection 06/22/2017  . End stage renal disease on dialysis ([place])   . Hypoxia   . COPD with acute exacerbation ([place]) 06/21/2017  . Sepsis ([place]) 06/21/2017  . Hypertension 03/15/2017  . Chest pain 03/15/2017  . High cholesterol 03/15/2017  . CKD (chronic kidney disease) stage V requiring chronic dialysis ([place]) 03/15/2017  . COPD (chronic obstructive pulmonary disease) GOLD stage III 03/15/2017  . Chronic hypoxemic respiratory failure ([place]) 03/15/2017  . PAF (paroxysmal atrial fibrillation) ([place]) 03/15/2017  . Acute on chronic respiratory failure with hypoxia ([place])   .  Pulmonary infiltrates on CXR 02/10/2017  . Upper airway cough syndrome 08/13/2016  . Memory loss of unknown cause 04/08/2016  . Chronic respiratory failure with hypoxia ([place]) 08/27/2015  . ESRD (end stage renal disease) on dialysis ([place]) 07/28/2015  . HCAP (healthcare-associated pneumonia)   . Pneumothorax, left   . Acute on chronic combined systolic and diastolic CHF (congestive heart failure) ([place])   . Anemia of chronic disease   . Pneumonia due to Enterobacter cloacae ([place])   . Pneumonia due to Klebsiella pneumoniae ([place])   . Chronic atrial fibrillation   . CAD (coronary artery disease) 02/04/2015  . Hyperkalemia 04/13/2014  . COPD GOLD IV 10/22/2012  . Other and unspecified  hyperlipidemia 10/22/2012  . Osteoarthritis 10/22/2012  . Essential hypertension 10/22/2012   [place] PT, DPT, LAT, ATC  03/14/19  2:06 PM      [facility] [address] [place], [name], [address] Phone: [phone]   Fax:  [phone]  Name: Belal Scallon. MRN: [id number] Date of Birth: 10/17/1934

## 2019-03-14 NOTE — Patient Instructions (Signed)
Continue all previous medications without any changes at this time  

## 2019-03-15 ENCOUNTER — Telehealth: Payer: Self-pay | Admitting: Cardiovascular Disease

## 2019-03-15 DIAGNOSIS — Z23 Encounter for immunization: Secondary | ICD-10-CM | POA: Diagnosis not present

## 2019-03-15 DIAGNOSIS — Z992 Dependence on renal dialysis: Secondary | ICD-10-CM | POA: Diagnosis not present

## 2019-03-15 DIAGNOSIS — N186 End stage renal disease: Secondary | ICD-10-CM | POA: Diagnosis not present

## 2019-03-15 NOTE — Telephone Encounter (Signed)
Thank you for helping take care of this!

## 2019-03-15 NOTE — Telephone Encounter (Signed)
I spoke to Terry [name] this morning. We will need to arrange for him to come to the [facility] Vascular Lab next week for attempted u/s guided compression of the left groin pseudoaneurysm. It looks like he could do Tuesday of next week. I will check with the Vascular lab scheduling today to see if this is an option. He does dialysis on Monday, Wed and Friday in [place].   I will follow up with him later this week.   Lauree Chandler

## 2019-03-16 ENCOUNTER — Telehealth: Payer: Self-pay | Admitting: Cardiovascular Disease

## 2019-03-16 ENCOUNTER — Other Ambulatory Visit (HOSPITAL_COMMUNITY): Payer: Self-pay | Admitting: Cardiology

## 2019-03-16 ENCOUNTER — Other Ambulatory Visit (HOSPITAL_COMMUNITY): Payer: Self-pay

## 2019-03-16 ENCOUNTER — Other Ambulatory Visit (HOSPITAL_COMMUNITY): Payer: Self-pay | Admitting: Cardiovascular Disease

## 2019-03-16 DIAGNOSIS — I729 Aneurysm of unspecified site: Secondary | ICD-10-CM

## 2019-03-16 DIAGNOSIS — T81718A Complication of other artery following a procedure, not elsewhere classified, initial encounter: Secondary | ICD-10-CM

## 2019-03-16 NOTE — Addendum Note (Signed)
Addended by: Lauree Chandler D on: 03/16/2019 02:27 PM   Modules accepted: Orders

## 2019-03-16 NOTE — Addendum Note (Signed)
Addended by: Lauree Chandler D on: 03/16/2019 02:02 PM   Modules accepted: Orders, SmartSet

## 2019-03-16 NOTE — Telephone Encounter (Signed)
[name], I am scheduling Mr. Sabol to come in next week on Tuesday 03/21/19 for compression of the left groin pseudoaneurysm. This is scheduled at 10:30 am in the cath lab. He will need to come into the Short Stay area by 9 am and we will then move him over the holding area in the cath lab for the procedure. I will be there with the vascular tech and will provide pain medications as needed. I have spoken to Mr. Schoffstall and his wife. Can you call them and go over instructions. No need to hold any meds. No labs needed. The mobile number in Epic is his wife's number. I have placed orders and have placed him on my cath schedule for 10:30 am to hold the spot. I told them you would call on Friday to tell them where to park etc.   Thanks,  [name]

## 2019-03-17 ENCOUNTER — Other Ambulatory Visit (HOSPITAL_COMMUNITY): Payer: Self-pay | Admitting: Cardiovascular Disease

## 2019-03-17 DIAGNOSIS — N186 End stage renal disease: Secondary | ICD-10-CM | POA: Diagnosis not present

## 2019-03-17 DIAGNOSIS — Z23 Encounter for immunization: Secondary | ICD-10-CM | POA: Diagnosis not present

## 2019-03-17 DIAGNOSIS — Z992 Dependence on renal dialysis: Secondary | ICD-10-CM | POA: Diagnosis not present

## 2019-03-17 NOTE — Telephone Encounter (Signed)
Pt has dialysis on Monday in [place]. He will finish after 3:00 on Monday and then come to home in [place] area. Will need COVID testing AM of procedure. I spoke with short stay and pt should arrive at 8:30 on 9/29. I spoke with pt's wife and told her pt should be npo after midnight but could have clear liquids until 6 AM and that it was OK to take all his regular medications.  I told her pt should arrive at 8:30.  She is aware to enter through entrance A and that she and pt will need to wear a mask.

## 2019-03-20 DIAGNOSIS — N186 End stage renal disease: Secondary | ICD-10-CM | POA: Diagnosis not present

## 2019-03-20 DIAGNOSIS — Z23 Encounter for immunization: Secondary | ICD-10-CM | POA: Diagnosis not present

## 2019-03-20 DIAGNOSIS — Z992 Dependence on renal dialysis: Secondary | ICD-10-CM | POA: Diagnosis not present

## 2019-03-21 ENCOUNTER — Ambulatory Visit (HOSPITAL_COMMUNITY)
Admission: RE | Admit: 2019-03-21 | Discharge: 2019-03-21 | Disposition: A | Discharge status: Home / Self Care | Payer: Medicare PPO | Source: Ambulatory Visit | Attending: Cardiovascular Disease | Admitting: Cardiovascular Disease

## 2019-03-21 ENCOUNTER — Other Ambulatory Visit: Payer: Self-pay

## 2019-03-21 DIAGNOSIS — Z20828 Contact with and (suspected) exposure to other viral communicable diseases: Secondary | ICD-10-CM | POA: Insufficient documentation

## 2019-03-21 DIAGNOSIS — I729 Aneurysm of unspecified site: Secondary | ICD-10-CM

## 2019-03-21 DIAGNOSIS — I724 Aneurysm of artery of lower extremity: Secondary | ICD-10-CM | POA: Insufficient documentation

## 2019-03-21 DIAGNOSIS — R1030 Lower abdominal pain, unspecified: Secondary | ICD-10-CM | POA: Diagnosis not present

## 2019-03-21 DIAGNOSIS — T81718A Complication of other artery following a procedure, not elsewhere classified, initial encounter: Secondary | ICD-10-CM

## 2019-03-21 DIAGNOSIS — Z01818 Encounter for other preprocedural examination: Secondary | ICD-10-CM | POA: Diagnosis not present

## 2019-03-21 LAB — SARS CORONAVIRUS 2 BY RT PCR (HOSPITAL ORDER, PERFORMED IN ~~LOC~~ HOSPITAL LAB): SARS Coronavirus 2: NEGATIVE

## 2019-03-21 MED ORDER — FENTANYL CITRATE (PF) 100 MCG/2ML IJ SOLN
100.0000 ug | Freq: Once | INTRAMUSCULAR | Status: AC
  Administered 2019-03-21: 100 ug via INTRAVENOUS
  Filled 2019-03-21: qty 2

## 2019-03-21 MED ORDER — THROMBIN FOR PERCUTANEOUS TREATMENT OF PSEUDOANEURYSM (5000UNITS/10ML)
5000.0000 [IU] | Freq: Once | PERCUTANEOUS | Status: DC
  Filled 2019-03-21: qty 1

## 2019-03-21 MED ORDER — FENTANYL CITRATE (PF) 100 MCG/2ML IJ SOLN
50.0000 ug | Freq: Once | INTRAMUSCULAR | Status: AC
  Administered 2019-03-21: 11:00:00 50 ug via INTRAVENOUS
  Filled 2019-03-21: qty 1

## 2019-03-21 NOTE — Discharge Instructions (Signed)
Moderate Conscious Sedation, Adult, Care After °These instructions provide you with information about caring for yourself after your procedure. Your health care provider may also give you more specific instructions. Your treatment has been planned according to current medical practices, but problems sometimes occur. Call your health care provider if you have any problems or questions after your procedure. °What can I expect after the procedure? °After your procedure, it is common: °· To feel sleepy for several hours. °· To feel clumsy and have poor balance for several hours. °· To have poor judgment for several hours. °· To vomit if you eat too soon. °Follow these instructions at home: °For at least 24 hours after the procedure: ° °· Do not: °? Participate in activities where you could fall or become injured. °? Drive. °? Use heavy machinery. °? Drink alcohol. °? Take sleeping pills or medicines that cause drowsiness. °? Make important decisions or sign legal documents. °? Take care of children on your own. °· Rest. °Eating and drinking °· Follow the diet recommended by your health care provider. °· If you vomit: °? Drink water, juice, or soup when you can drink without vomiting. °? Make sure you have little or no nausea before eating solid foods. °General instructions °· Have a responsible adult stay with you until you are awake and alert. °· Take over-the-counter and prescription medicines only as told by your health care provider. °· If you smoke, do not smoke without supervision. °· Keep all follow-up visits as told by your health care provider. This is important. °Contact a health care provider if: °· You keep feeling nauseous or you keep vomiting. °· You feel light-headed. °· You develop a rash. °· You have a fever. °Get help right away if: °· You have trouble breathing. °This information is not intended to replace advice given to you by your health care provider. Make sure you discuss any questions you have  with your health care provider. °Document Released: 03/29/2013 Document Revised: 05/21/2017 Document Reviewed: 09/28/2015 °Elsevier Patient Education © 2020 [employer] Inc. ° °

## 2019-03-21 NOTE — Progress Notes (Signed)
Place on tele to monitor vascular and Dr Malcolm Metro at bedside

## 2019-03-21 NOTE — H&P (View-Only) (Signed)
Pt here today for u/s guided compression of left groin pseudoaneurysm.   Monitoring established with active rhythm, O2 sats. BP cycled every 5 minutes.   Total of 150 mcg IV Fentanyl given for pain control.   With the assistance of the Vascular surgery technician, u/s guided manual compression applied to the left groin. At the conclusion, the large pseudoaneurysm is mostly closed.   Procedure ended. Pt stable. I spoke to his wife. D/c Home today after bedrest for 2 hours.   Lauree Chandler 03/21/2019 11:32 AM

## 2019-03-21 NOTE — Progress Notes (Signed)
Discharge instructions reviewed with pt and his wife (via telephone) both voices understanding.

## 2019-03-21 NOTE — Progress Notes (Signed)
Discharged alert and orient. D/c via wheelchair no c/o pain voiced

## 2019-03-21 NOTE — Progress Notes (Signed)
Pt request O2 be applied (wears at home) O2 applied.

## 2019-03-21 NOTE — Progress Notes (Signed)
Pt here today for u/s guided compression of left groin pseudoaneurysm.   Monitoring established with active rhythm, O2 sats. BP cycled every 5 minutes.   Total of 150 mcg IV Fentanyl given for pain control.   With the assistance of the Vascular surgery technician, u/s guided manual compression applied to the left groin. At the conclusion, the large pseudoaneurysm is mostly closed.   Procedure ended. Pt stable. I spoke to his wife. D/c Home today after bedrest for 2 hours.   Lauree Chandler 03/21/2019 11:32 AM

## 2019-03-21 NOTE — Progress Notes (Signed)
Left groin pseudoaneurysm compression has been completed. Preliminary results can be found in CV Proc through chart review.   03/21/19 11:32 AM Carlos Levering RVT

## 2019-03-22 DIAGNOSIS — Z992 Dependence on renal dialysis: Secondary | ICD-10-CM | POA: Diagnosis not present

## 2019-03-22 DIAGNOSIS — N186 End stage renal disease: Secondary | ICD-10-CM | POA: Diagnosis not present

## 2019-03-22 DIAGNOSIS — Z23 Encounter for immunization: Secondary | ICD-10-CM | POA: Diagnosis not present

## 2019-03-23 DIAGNOSIS — Z992 Dependence on renal dialysis: Secondary | ICD-10-CM | POA: Diagnosis not present

## 2019-03-23 DIAGNOSIS — N186 End stage renal disease: Secondary | ICD-10-CM | POA: Diagnosis not present

## 2019-03-24 DIAGNOSIS — N186 End stage renal disease: Secondary | ICD-10-CM | POA: Diagnosis not present

## 2019-03-24 DIAGNOSIS — Z992 Dependence on renal dialysis: Secondary | ICD-10-CM | POA: Diagnosis not present

## 2019-03-27 DIAGNOSIS — N186 End stage renal disease: Secondary | ICD-10-CM | POA: Diagnosis not present

## 2019-03-27 DIAGNOSIS — Z992 Dependence on renal dialysis: Secondary | ICD-10-CM | POA: Diagnosis not present

## 2019-03-28 ENCOUNTER — Encounter (HOSPITAL_COMMUNITY): Payer: Self-pay | Admitting: Vascular Surgery

## 2019-03-28 ENCOUNTER — Other Ambulatory Visit: Payer: Self-pay

## 2019-03-28 DIAGNOSIS — I350 Nonrheumatic aortic (valve) stenosis: Secondary | ICD-10-CM

## 2019-03-28 NOTE — Anesthesia Preprocedure Evaluation (Deleted)
Anesthesia Evaluation    Airway        Dental   Pulmonary former smoker,           Cardiovascular hypertension,      Neuro/Psych    GI/Hepatic   Endo/Other    Renal/GU      Musculoskeletal   Abdominal   Peds  Hematology   Anesthesia Other Findings   Reproductive/Obstetrics                             Anesthesia Physical Anesthesia Plan  ASA:   Anesthesia Plan:    Post-op Pain Management:    Induction:   PONV Risk Score and Plan:   Airway Management Planned:   Additional Equipment:   Intra-op Plan:   Post-operative Plan:   Informed Consent:   Plan Discussed with:   Anesthesia Plan Comments: (PAT note written 03/31/2019 by Myra Gianotti, PA-C.   Per Theodosia Quay, RN ([facility]):  We will plan to use Left Radial for pigtail catheter, Right Femoral Vein for pacing wire. The pt has Right upper arm dialysis fistula and recent (9/29) compression of pseudoaneurysm of Left Femoral Artery (also Hx of vascular surgery in 2019 to Left Femoral). ANESTHESIA SHOULD NOT PLACE ARTERIAL LINE FOR THIS PATIENT.)       Anesthesia Quick Evaluation

## 2019-03-29 DIAGNOSIS — N186 End stage renal disease: Secondary | ICD-10-CM | POA: Diagnosis not present

## 2019-03-29 DIAGNOSIS — Z992 Dependence on renal dialysis: Secondary | ICD-10-CM | POA: Diagnosis not present

## 2019-03-29 NOTE — Pre-Procedure Instructions (Signed)
[place]  03/29/2019      [facility] [place] [place] [facility] [place] [address] [name] [address] Phone: [phone] Fax: [facility] [place], [place] [address] Phone: [phone] Fax: [phone]    Your procedure is scheduled on Oct. 13.  Report to [facility] Entrance A at 8:00 A.M.  Call this number if you have problems the morning of surgery:  [phone]   Remember:  Do not eat or drink after midnight.      Take these medicines the morning of surgery with A SIP OF WATER :               Albuterol inhaler--bring to hospital             symbicort---bring to hospital             7 days prior to surgery STOP taking any Aspirin and plavix (unless otherwise instructed by your surgeon), Aleve, Naproxen, Ibuprofen, Motrin, Advil, Goody's, BC's, all herbal medications, fish oil, and all vitamins.                 Do not wear jewelry, make-up or nail polish.  Do not wear lotions, powders, or perfumes, or deodorant.  Do not shave 48 hours prior to surgery.  Men may shave face and neck.  Do not bring valuables to the hospital.  [facility] is not responsible for any belongings or valuables.  Contacts, dentures or bridgework may not be worn into surgery.  Leave your suitcase in the car.  After surgery it may be brought to your room.  For patients admitted to the hospital, discharge time will be determined by your treatment team.  Patients discharged the day of surgery will not be allowed to drive home.    Special instructions:   - Preparing For Surgery  Before surgery, you can play an important role. Because skin is not sterile, your skin needs to be as free of germs as possible. You can reduce the number of germs on your skin by washing with CHG (chlorahexidine gluconate) Soap before surgery.  CHG is an antiseptic cleaner which  kills germs and bonds with the skin to continue killing germs even after washing.    Oral Hygiene is also important to reduce your risk of infection.  Remember - BRUSH YOUR TEETH THE MORNING OF SURGERY WITH YOUR REGULAR TOOTHPASTE  Please do not use if you have an allergy to CHG or antibacterial soaps. If your skin becomes reddened/irritated stop using the CHG.  Do not shave (including legs and underarms) for at least 48 hours prior to first CHG shower. It is OK to shave your face.  Please follow these instructions carefully.   1. Shower the NIGHT BEFORE SURGERY and the MORNING OF SURGERY with CHG.   2. If you chose to wash your hair, wash your hair first as usual with your normal shampoo.  3. After you shampoo, rinse your hair and body thoroughly to remove the shampoo.  4. Use CHG as you would any other liquid soap. You can apply CHG directly to the skin and wash gently with a scrungie or a clean washcloth.   5. Apply the CHG Soap to your body ONLY FROM THE NECK DOWN.  Do not use on open wounds or open sores. Avoid contact with your eyes, ears, mouth and  genitals (private parts). Wash Face and genitals (private parts)  with your normal soap.  6. Wash thoroughly, paying special attention to the area where your surgery will be performed.  7. Thoroughly rinse your body with warm water from the neck down.  8. DO NOT shower/wash with your normal soap after using and rinsing off the CHG Soap.  9. Pat yourself dry with a CLEAN TOWEL.  10. Wear CLEAN PAJAMAS to bed the night before surgery, wear comfortable clothes the morning of surgery  11. Place CLEAN SHEETS on your bed the night of your first shower and DO NOT SLEEP WITH PETS.    Day of Surgery:  Do not apply any deodorants/lotions.  Please wear clean clothes to the hospital/surgery center.   Remember to brush your teeth WITH YOUR REGULAR TOOTHPASTE.    Please read over the following fact sheets that you were given. Coughing  and Deep Breathing, MRSA Information and Surgical Site Infection Prevention

## 2019-03-30 ENCOUNTER — Ambulatory Visit (HOSPITAL_COMMUNITY)
Admission: RE | Admit: 2019-03-30 | Discharge: 2019-03-30 | Disposition: A | Discharge status: Home / Self Care | Payer: Medicare PPO | Source: Ambulatory Visit | Attending: Cardiovascular Disease | Admitting: Cardiovascular Disease

## 2019-03-30 ENCOUNTER — Other Ambulatory Visit: Payer: Self-pay

## 2019-03-30 ENCOUNTER — Encounter (HOSPITAL_COMMUNITY)
Admission: RE | Admit: 2019-03-30 | Discharge: 2019-03-30 | Disposition: A | Discharge status: Home / Self Care | Payer: Medicare PPO | Source: Ambulatory Visit | Attending: Cardiovascular Disease | Admitting: Cardiovascular Disease

## 2019-03-30 ENCOUNTER — Encounter (HOSPITAL_COMMUNITY): Payer: Self-pay

## 2019-03-30 DIAGNOSIS — E785 Hyperlipidemia, unspecified: Secondary | ICD-10-CM | POA: Diagnosis not present

## 2019-03-30 DIAGNOSIS — I2510 Atherosclerotic heart disease of native coronary artery without angina pectoris: Secondary | ICD-10-CM | POA: Diagnosis not present

## 2019-03-30 DIAGNOSIS — I4891 Unspecified atrial fibrillation: Secondary | ICD-10-CM | POA: Diagnosis not present

## 2019-03-30 DIAGNOSIS — Z01818 Encounter for other preprocedural examination: Secondary | ICD-10-CM | POA: Diagnosis not present

## 2019-03-30 DIAGNOSIS — J449 Chronic obstructive pulmonary disease, unspecified: Secondary | ICD-10-CM | POA: Insufficient documentation

## 2019-03-30 DIAGNOSIS — Z79899 Other long term (current) drug therapy: Secondary | ICD-10-CM | POA: Diagnosis not present

## 2019-03-30 DIAGNOSIS — J439 Emphysema, unspecified: Secondary | ICD-10-CM | POA: Diagnosis not present

## 2019-03-30 DIAGNOSIS — I350 Nonrheumatic aortic (valve) stenosis: Secondary | ICD-10-CM | POA: Diagnosis not present

## 2019-03-30 DIAGNOSIS — I132 Hypertensive heart and chronic kidney disease with heart failure and with stage 5 chronic kidney disease, or end stage renal disease: Secondary | ICD-10-CM | POA: Insufficient documentation

## 2019-03-30 DIAGNOSIS — Z7982 Long term (current) use of aspirin: Secondary | ICD-10-CM | POA: Diagnosis not present

## 2019-03-30 DIAGNOSIS — I509 Heart failure, unspecified: Secondary | ICD-10-CM | POA: Diagnosis not present

## 2019-03-30 DIAGNOSIS — N186 End stage renal disease: Secondary | ICD-10-CM | POA: Diagnosis not present

## 2019-03-30 DIAGNOSIS — Z87891 Personal history of nicotine dependence: Secondary | ICD-10-CM | POA: Diagnosis not present

## 2019-03-30 HISTORY — DX: Peripheral vascular disease, unspecified: I73.9

## 2019-03-30 LAB — BLOOD GAS, ARTERIAL
Acid-Base Excess: 0.9 mmol/L (ref 0.0–2.0)
Bicarbonate: 25.2 mmol/L (ref 20.0–28.0)
Drawn by: 421801
FIO2: 21
O2 Saturation: 92.5 %
Patient temperature: 98.6
pCO2 arterial: 41.7 mmHg (ref 32.0–48.0)
pH, Arterial: 7.398 (ref 7.350–7.450)
pO2, Arterial: 69 mmHg — ABNORMAL LOW (ref 83.0–108.0)

## 2019-03-30 LAB — COMPREHENSIVE METABOLIC PANEL
ALT: 15 U/L (ref 0–44)
AST: 17 U/L (ref 15–41)
Albumin: 3.5 g/dL (ref 3.5–5.0)
Alkaline Phosphatase: 70 U/L (ref 38–126)
Anion gap: 14 (ref 5–15)
BUN: 39 mg/dL — ABNORMAL HIGH (ref 8–23)
CO2: 22 mmol/L (ref 22–32)
Calcium: 9.2 mg/dL (ref 8.9–10.3)
Chloride: 98 mmol/L (ref 98–111)
Creatinine, Ser: 5.76 mg/dL — ABNORMAL HIGH (ref 0.61–1.24)
GFR calc Af Amer: 10 mL/min — ABNORMAL LOW (ref >60)
GFR calc non Af Amer: 8 mL/min — ABNORMAL LOW (ref >60)
Glucose, Bld: 137 mg/dL — ABNORMAL HIGH (ref 70–99)
Potassium: 4.4 mmol/L (ref 3.5–5.1)
Sodium: 134 mmol/L — ABNORMAL LOW (ref 135–145)
Total Bilirubin: 0.6 mg/dL (ref 0.3–1.2)
Total Protein: 6.2 g/dL — ABNORMAL LOW (ref 6.5–8.1)

## 2019-03-30 LAB — BRAIN NATRIURETIC PEPTIDE: B Natriuretic Peptide: 384.1 pg/mL — ABNORMAL HIGH (ref 0.0–100.0)

## 2019-03-30 LAB — CBC
HCT: 40.1 % (ref 39.0–52.0)
Hemoglobin: 12.4 g/dL — ABNORMAL LOW (ref 13.0–17.0)
MCH: 32.5 pg (ref 26.0–34.0)
MCHC: 30.9 g/dL (ref 30.0–36.0)
MCV: 105 fL — ABNORMAL HIGH (ref 80.0–100.0)
Platelets: 206 10*3/uL (ref 150–400)
RBC: 3.82 MIL/uL — ABNORMAL LOW (ref 4.22–5.81)
RDW: 20.6 % — ABNORMAL HIGH (ref 11.5–15.5)
WBC: 9.5 10*3/uL (ref 4.0–10.5)
nRBC: 0 % (ref 0.0–0.2)

## 2019-03-30 LAB — PROTIME-INR
INR: 1 (ref 0.8–1.2)
Prothrombin Time: 13.2 seconds (ref 11.4–15.2)

## 2019-03-30 LAB — SURGICAL PCR SCREEN
MRSA, PCR: POSITIVE — AB
Staphylococcus aureus: POSITIVE — AB

## 2019-03-30 LAB — HEMOGLOBIN A1C
Hgb A1c MFr Bld: 5.5 % (ref 4.8–5.6)
Mean Plasma Glucose: 111.15 mg/dL

## 2019-03-30 LAB — TYPE AND SCREEN
ABO/RH(D): A POS
Antibody Screen: NEGATIVE

## 2019-03-30 LAB — APTT: aPTT: 29 seconds (ref 24–36)

## 2019-03-30 NOTE — Progress Notes (Signed)
PCP -  Cardiologist - DR [place] MED HX  D    Chest x-ray - TODAY EKG - TODAY Stress Test - ECHO - 8/20 Cardiac Cath -9/20  Sleep Study - YES CPAP - HOME O2   Fasting Blood Sugar - NA   Blood Thinner Instructions:ASA Aspirin Instructions:STOP   - P   COVID TEST- WILL DO DOS   Anesthesia review: PT RARELY URINATES.   ON DIALYSIS    HEART HX Patient denies shortness of breath, fever, cough and chest pain at PAT appointment   Patient verbalized understanding of instructions that were given to them at the PAT appointment. Patient was also instructed that they will need to review over the PAT instructions again at home before surgery.

## 2019-03-30 NOTE — Progress Notes (Signed)
Surgical PCR +MRSA and +MSSA. Mupirocin called into [facility] [phone]. Patient made aware. Verbalized understanding.

## 2019-03-31 ENCOUNTER — Encounter (HOSPITAL_COMMUNITY): Payer: Self-pay

## 2019-03-31 DIAGNOSIS — Z992 Dependence on renal dialysis: Secondary | ICD-10-CM | POA: Diagnosis not present

## 2019-03-31 DIAGNOSIS — N186 End stage renal disease: Secondary | ICD-10-CM | POA: Diagnosis not present

## 2019-03-31 NOTE — Progress Notes (Signed)
Anesthesia Chart Review:  Case: [id number] Date/Time: 04/04/19 1030   Procedures:      TRANSCATHETER AORTIC VALVE REPLACEMENT, LEFT CAROTID (N/A )     TRANSESOPHAGEAL ECHOCARDIOGRAM (TEE) (N/A )   Anesthesia type: General   Pre-op diagnosis: Severe Aortic Stenosis   Location: [facility] OR ROOM 16 / [facility]   Surgeon: Burnell Blanks, MD    CT surgeon: Darylene Price, MD  DISCUSSION: Patient is an 84 year old male scheduled for the above procedure.  History includes severe AS, CAD (s/p BMS mid RCA 11/30/97; DES ostial & mid RCA 08/2017 in [place]; s/p PTCA proximal & mid RCA 02/21/19), chronic diastolic CHF, atrial fib (diagnosed 2014; not on anticoagulation, history GI bleed 12/2017), ESRD (hemodialysis MWF at [facility]; RUE AVF), PAD (s/p left common and profunda femoral endarterectomy/Dacron patch angioplasty 12/29/17), former smoker, COPD (nocturnal and PRN home O2 1L), HTN, HLD, dyspnea, CVA (2012).   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 S/p US guided compression 03/21/19. At the conclusion, the large pseudoaneurysm is mostly closed (flow in the neck, thrombosed sac).    Patient lives in [place], [name] and has HD on MWF, so he is having a rapid COVID-19 test on the day of surgery. He will also need ISTAT given ESRD history.   Per TAVR team: "We will plan to use Left Radial for pigtail catheter, Right Femoral Vein for pacing wire. The pt has Right upper arm dialysis fistula and recent (9/29) compression of pseudoaneurysm of Left Femoral Artery (also Hx of vascular surgery in 2019 to Left Femoral). Anesthesia should not place arterial line for this patient."  Anesthesia team to evaluate on the day of surgery. He is on prednisone 10 mg daily according to his current medication list.   VS: BP (!) 142/50   Pulse 97   Temp 37 C   Resp 20   Ht 5\' 8"  (1.727 m)   Wt 70.3 kg   SpO2 92% Comment: notified Garth Schlatter RN  BMI  23.57 kg/m    PROVIDERS: [name], DO is PCP Lauree Chandler, MD is cardiologist   LABS: Labs reviewed: Acceptable for surgery. No UA (rarely urinates with ESRD). ISTAT day of surgery due to ESRD. (all labs ordered are listed, but only abnormal results are displayed)  Labs Reviewed  SURGICAL PCR SCREEN - Abnormal; Notable for the following components:      Result Value   MRSA, PCR POSITIVE (*)    Staphylococcus aureus POSITIVE (*)    All other components within normal limits  BLOOD GAS, ARTERIAL - Abnormal; Notable for the following components:   pO2, Arterial 69.0 (*)    All other components within normal limits  BRAIN NATRIURETIC PEPTIDE - Abnormal; Notable for the following components:   B Natriuretic Peptide 384.1 (*)    All other components within normal limits  CBC - Abnormal; Notable for the following components:   RBC 3.82 (*)    Hemoglobin 12.4 (*)    MCV 105.0 (*)    RDW 20.6 (*)    All other components within normal limits  COMPREHENSIVE METABOLIC PANEL - Abnormal; Notable for the following components:   Sodium 134 (*)    Glucose, Bld 137 (*)    BUN 39 (*)    Creatinine, Ser 5.76 (*)    Total Protein 6.2 (*)    GFR calc non Af Amer 8 (*)    GFR calc Af Wyvonnia Lora  10 (*)    All other components within normal limits  APTT  HEMOGLOBIN A1C  PROTIME-INR  TYPE AND SCREEN    PFTs 11/19/18: Post-BD: FVC: 2.50 (67%), FEV1 1.21 (45%). DLCO unc 11.10 (39%).    IMAGES: CXR 19/8/20: IMPRESSION: 1. No acute cardiopulmonary findings. 2. Hyperinflation likely reflects emphysema. 3. Aortic atherosclerosis.   CTA chest/abd/pelvis 03/14/19: IMPRESSION: 1. Vascular findings and measurements pertinent to potential TAVR procedure, as detailed above. 2. Severe thickening calcification of the aortic valve, compatible with the reported clinical history of severe aortic stenosis. 3. Large pseudoaneurysm in the left groin, which appears to arise from left common  femoral artery. This measures 3.5 x 3.2 x 4.6 cm and is partially thrombosed inferiorly, however, the superior aspect of the pseudoaneurysm remains patent demonstrating avid arterial phase hyperenhancement. 4. Aortic atherosclerosis, in addition to left main and 3 vessel coronary artery disease. 5. 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 6. The appearance of the kidneys suggests cystic disease of hemodialysis. 7. Morphologic changes in the liver indicative of underlying cirrhosis. 8. Cholelithiasis without evidence of acute cholecystitis at this time. 9. Mild colonic diverticulosis without evidence of acute diverticulitis at this time. 10. Additional incidental findings, as above.    EKG: 03/30/19: Atrial fibrillation at 98 bpm Septal infarct , age undetermined New QS pattern in V2 may be due to lead misplacement Confirmed by Croitoru, Mihai [phone]) on 03/30/2019 5:52:30 PM   CV: Left Groin Pseudoaneurysm [place] 03/21/19: Left Artery comments: Monophasic flow detected in the proximal superficial femoral artery, and the posterior tibial, and anterior tibial arteries. After 35 minutes of compression, flow is still detected in the neck of the pseudoaneurysm. Pseudoaneurysm sack appears to be thrombosed.    Carotid US 03/14/19: Summary: Right Carotid: Velocities in the right ICA are consistent with a 1-39% stenosis. Left Carotid: Velocities in the left ICA are consistent with a 1-39% stenosis. Vertebrals:  Bilateral vertebral arteries demonstrate antegrade flow. Subclavians: Normal flow hemodynamics were seen in bilateral subclavian              arteries.   CT coronary 03/14/19: IMPRESSION: 1. Trileaflet aortic valve with severely thickened and calcified aortic leaflets and severely restricted leaflets opening, there are no calcifications extending into the LVOT. Aortic valve calcium score 2503 consistent with severe aortic stenosis. Annular measurements suitable for delivery of a 26 mm  Edwards-SAPIEN Ultra valve. 2. Sufficient coronary to annulus distance. 3. Optimum Fluoroscopic Angle for Delivery: LAO 10 CAO 9 4. No thrombus in the left atrial appendage.   Cardiac cath 02/21/19:  Prox RCA lesion is 90% stenosed.  Prox RCA to Mid RCA lesion is 95% stenosed.  Ost Cx to Prox Cx lesion is 20% stenosed.  Ost LM to Mid LM lesion is 20% stenosed.  Prox LAD to Mid LAD lesion is 30% stenosed.  Post intervention, there is a 40% residual stenosis.  Balloon angioplasty was performed.  Post intervention, there is a 60% residual stenosis.  Balloon angioplasty was performed. 1. Severe single vessel CAD with severe restenosis in the proximal and mid RCA stented segments. Balloon angioplasty of the proximal and mid stented segments. Residual moderate disease in both segments as I was unable to deliver large non-compliant balloons or a cutting balloon into the mid vessel.  2. Mild non-obstructive disease in the LAD and Circumflex 3. Severe aortic stenosis by echo (Cath data with mean gradient 17. 5 mmHg, peak to peak gradient 17 mmHg, AVA 1.2 cm2) - Recommendations: Will admit to telemetry post  PCI. Continue DAPT with ASA and Plavix. Will plan TAVR scans over next few weeks.   Echo 02/16/19: IMPRESSIONS  1. The left ventricle has normal systolic function, with an ejection fraction of 55-60%. The cavity size was normal. Left ventricular diastolic function could not be evaluated secondary to atrial fibrillation.  2. Left atrial size was moderately dilated.  3. The mitral valve is degenerative. Moderate thickening of the mitral valve leaflet. There is moderate mitral annular calcification present.  4. The aortic valve is tricuspid Severely thickening of the aortic valve. Severe calcifcation of the aortic valve. Aortic valve regurgitation is mild to moderate by color flow Doppler. There is Moderate stenosis of the aortic valve, with a calculated  valve area of 0.53 cm. AV Vmax:  350.60 cm/s. AV Mean Grad: 29.2 mmHg. LVOT/AV VTI ratio: 0.19. Suspect that AS is more on the severe side given reduced dimensionless index.  5. The aorta is normal unless otherwise noted.  6. Compared to prior echo, the mean AVG has increased from 20 to 17mmHg.   Past Medical History:  Diagnosis Date  . Anxiety   . Aortic stenosis   . Arthritis    "hands, knees" (03/15/2017)  . CAD (coronary artery disease) [place], [name]  . CHF (congestive heart failure) ([place]) 09/2012  . COPD (chronic obstructive pulmonary disease) ([place])   . Dyspnea   . ESRD (end stage renal disease) on dialysis [facility])    "MWF; [facility] [place]" (03/15/2017)     [place] URINE  . HCAP (healthcare-associated pneumonia) 03/15/2017  . HLD (hyperlipidemia)   . Hypertension   . On home oxygen therapy    "1L typically at night" (03/15/2017)  . Osteoarthritis 10/22/2012  . PAD (peripheral artery disease) ([place])   . PAF (paroxysmal atrial fibrillation) ([place])   . Stroke [facility]) 2012   denies residual on 03/15/2017    Past Surgical History:  Procedure Laterality Date  . ABDOMINAL AORTOGRAM N/A 12/16/2017   Procedure: ABDOMINAL AORTOGRAM;  Surgeon: Conrad Simpson, MD;  Location: [facility];  Service: Cardiovascular;  Laterality: N/A;  . APPENDECTOMY  1984  . AV FISTULA PLACEMENT Right 05/21/2015   Procedure: Right Arm Brachiocephalic ARTERIOVENOUS (AV) FISTULA CREATION;  Surgeon: Angelia Mould, MD;  Location: [facility];  Service: Vascular;  Laterality: Right;  . CATARACT EXTRACTION W/ INTRAOCULAR LENS  IMPLANT, BILATERAL Bilateral   . COLECTOMY  2012   partial colon removed - twisted bowel  . CORONARY ANGIOPLASTY WITH STENT PLACEMENT  1999   2 stents  . CORONARY BALLOON ANGIOPLASTY N/A 02/21/2019   Procedure: CORONARY BALLOON ANGIOPLASTY;  Surgeon: Burnell Blanks, MD;  Location: [facility];  Service: Cardiovascular;  Laterality: N/A;  . CYSTOSCOPY/RETROGRADE/URETEROSCOPY Bilateral  04/23/2014   Procedure: CYSTOSCOPY BILATERAL RETROGRADE,  LEFT URETEROSCOPY, LEFT STENT PLACEMENT;  Surgeon: Raynelle Bring, MD;  Location: [facility];  Service: Urology;  Laterality: Bilateral;  . ENDARTERECTOMY FEMORAL Left 12/29/2017   Procedure: Left Femoral ENDARTERECTOMY with Patch Angioplasty using Xenosure patch;  Surgeon: Rosetta Posner, MD;  Location: [facility];  Service: Vascular;  Laterality: Left;  . EYE SURGERY Left    "surgery to clean off lens after cataract OR"  . INGUINAL HERNIA REPAIR Right   . LOWER EXTREMITY ANGIOGRAPHY Bilateral 12/16/2017   Procedure: LOWER EXTREMITY ANGIOGRAPHY;  Surgeon: Conrad Frankfort, MD;  Location: [facility];  Service: Cardiovascular;  Laterality: Bilateral;  . RIGHT/LEFT HEART CATH AND CORONARY ANGIOGRAPHY N/A 02/21/2019  Procedure: RIGHT/LEFT HEART CATH AND CORONARY ANGIOGRAPHY;  Surgeon: Burnell Blanks, MD;  Location: [facility];  Service: Cardiovascular;  Laterality: N/A;    MEDICATIONS: . acetaminophen (TYLENOL) 500 MG tablet  . albuterol (PROVENTIL HFA;VENTOLIN HFA) 108 (90 Base) MCG/ACT inhaler  . aspirin EC 81 MG tablet  . atorvastatin (LIPITOR) 80 MG tablet  . calcium acetate (PHOSLO) 667 MG capsule  . cetirizine (ZYRTEC) 10 MG tablet  . clopidogrel (PLAVIX) 75 MG tablet  . dextromethorphan-guaiFENesin (MUCINEX DM) 30-600 MG 12hr tablet  . ethyl chloride spray  . furosemide (LASIX) 40 MG tablet  . metoprolol succinate (TOPROL-XL) 50 MG 24 hr tablet  . midodrine (PROAMATINE) 10 MG tablet  . OXYGEN  . oxymetazoline (AFRIN) 0.05 % nasal spray  . pantoprazole (PROTONIX) 40 MG tablet  . predniSONE (DELTASONE) 10 MG tablet  . SYMBICORT 160-4.5 MCG/ACT inhaler   No current facility-administered medications for this encounter.     Myra Gianotti, PA-C Surgical Short Stay/Anesthesiology [facility] Phone [phone] [facility] Phone [phone] 03/31/2019 10:12 AM

## 2019-04-03 DIAGNOSIS — N186 End stage renal disease: Secondary | ICD-10-CM | POA: Diagnosis not present

## 2019-04-03 DIAGNOSIS — Z992 Dependence on renal dialysis: Secondary | ICD-10-CM | POA: Diagnosis not present

## 2019-04-03 MED ORDER — VANCOMYCIN HCL 10 G IV SOLR
1250.0000 mg | INTRAVENOUS | Status: DC
  Filled 2019-04-03: qty 1250

## 2019-04-03 MED ORDER — NOREPINEPHRINE 4 MG/250ML-% IV SOLN
0.0000 ug/min | INTRAVENOUS | Status: DC
  Filled 2019-04-03: qty 250

## 2019-04-03 MED ORDER — POTASSIUM CHLORIDE 2 MEQ/ML IV SOLN
80.0000 meq | INTRAVENOUS | Status: DC
  Filled 2019-04-03: qty 40

## 2019-04-03 MED ORDER — SODIUM CHLORIDE 0.9 % IV SOLN
INTRAVENOUS | Status: DC
  Filled 2019-04-03: qty 30

## 2019-04-03 MED ORDER — SODIUM CHLORIDE 0.9 % IV SOLN
1.5000 g | INTRAVENOUS | Status: DC
  Filled 2019-04-03: qty 1.5

## 2019-04-03 MED ORDER — DEXMEDETOMIDINE HCL IN NACL 400 MCG/100ML IV SOLN
0.1000 ug/kg/h | INTRAVENOUS | Status: DC
  Filled 2019-04-03: qty 100

## 2019-04-03 MED ORDER — MAGNESIUM SULFATE 50 % IJ SOLN
40.0000 meq | INTRAMUSCULAR | Status: DC
  Filled 2019-04-03: qty 9.85

## 2019-04-04 ENCOUNTER — Inpatient Hospital Stay (HOSPITAL_COMMUNITY): Admission: RE | Admit: 2019-04-04 | Payer: Medicare PPO | Source: Home / Self Care | Admitting: Cardiovascular Disease

## 2019-04-04 ENCOUNTER — Ambulatory Visit (HOSPITAL_COMMUNITY): Payer: Medicare PPO

## 2019-04-04 ENCOUNTER — Encounter (HOSPITAL_COMMUNITY): Admission: RE | Payer: Self-pay | Source: Home / Self Care

## 2019-04-04 ENCOUNTER — Other Ambulatory Visit (HOSPITAL_COMMUNITY): Payer: Self-pay | Admitting: *Deleted

## 2019-04-04 SURGERY — IMPLANTATION, AORTIC VALVE, TRANSCATHETER, CAROTID ARTERY APPROACH
Anesthesia: General

## 2019-04-05 DIAGNOSIS — Z992 Dependence on renal dialysis: Secondary | ICD-10-CM | POA: Diagnosis not present

## 2019-04-05 DIAGNOSIS — N186 End stage renal disease: Secondary | ICD-10-CM | POA: Diagnosis not present

## 2019-04-07 DIAGNOSIS — N186 End stage renal disease: Secondary | ICD-10-CM | POA: Diagnosis not present

## 2019-04-07 DIAGNOSIS — Z992 Dependence on renal dialysis: Secondary | ICD-10-CM | POA: Diagnosis not present

## 2019-04-10 DIAGNOSIS — Z992 Dependence on renal dialysis: Secondary | ICD-10-CM | POA: Diagnosis not present

## 2019-04-10 DIAGNOSIS — N186 End stage renal disease: Secondary | ICD-10-CM | POA: Diagnosis not present

## 2019-04-10 NOTE — Anesthesia Preprocedure Evaluation (Addendum)
Anesthesia Evaluation  Patient identified by MRN, date of birth, ID band Patient awake    Reviewed: Allergy & Precautions, H&P , NPO status , Patient's Chart, lab work & pertinent test results  Airway Mallampati: II   Neck ROM: full    Dental   Pulmonary shortness of breath, COPD,  oxygen dependent, former smoker,    breath sounds clear to auscultation       Cardiovascular hypertension, + angina + CAD, + Cardiac Stents, + Peripheral Vascular Disease and +CHF  + dysrhythmias Atrial Fibrillation + Valvular Problems/Murmurs AS  Rhythm:regular Rate:Normal     Neuro/Psych PSYCHIATRIC DISORDERS Anxiety CVA    GI/Hepatic   Endo/Other    Renal/GU ESRF and DialysisRenal disease     Musculoskeletal  (+) Arthritis ,   Abdominal   Peds  Hematology   Anesthesia Other Findings   Reproductive/Obstetrics                            Anesthesia Physical Anesthesia Plan  ASA: IV  Anesthesia Plan: General   Post-op Pain Management:    Induction: Intravenous  PONV Risk Score and Plan: 2 and Ondansetron, Treatment may vary due to age or medical condition and Dexamethasone  Airway Management Planned: Oral ETT  Additional Equipment: Arterial line  Intra-op Plan:   Post-operative Plan:   Informed Consent: I have reviewed the patients History and Physical, chart, labs and discussed the procedure including the risks, benefits and alternatives for the proposed anesthesia with the patient or authorized representative who has indicated his/her understanding and acceptance.       Plan Discussed with: CRNA, Anesthesiologist and Surgeon  Anesthesia Plan Comments: (See previous PAT note written by Myra Gianotti, PA-C.   Per Theodosia Quay, RN with TAVR TEAM: Patient lives in [place], so he is arriving at 7:30 AM the morning of surgery for labs and COVID-19 test. "We will plan to use Left Radial for  pigtail catheter, Right Femoral Vein for pacing wire. The pt has Right upper arm dialysis fistula and recent (9/29) compression of pseudoaneurysm of Left Femoral Artery (also Hx of vascular surgery in 2019 to Left Femoral). Anesthesia should not place arterial line for this patient.")      Anesthesia Quick Evaluation

## 2019-04-10 NOTE — Progress Notes (Addendum)
Anesthesia Follow-up:   See previous PAT note written by Myra Gianotti, PA-C signed on 10/9/920. He was scheduled for TAVR, left carotid on 04/04/19, but case rescheduled (due to need for OR table repair) for 04/18/19.   Per Theodosia Quay, RN with TAVR TEAM: Patient lives in [place], so he is arriving at 7:30 AM the morning of surgery for labs and COVID-19 test. "We will plan to use Left Radial for pigtail catheter, Right Femoral Vein for pacing wire. The pt has Right upper arm dialysis fistula and recent (9/29) compression of pseudoaneurysm of Left Femoral Artery (also Hx of vascular surgery in 2019 to Left Femoral). Anesthesia should not place arterial line for this patient."   Myra Gianotti, PA-C Surgical Short Stay/Anesthesiology [facility] Phone [phone] [facility] Phone [phone] 04/10/2019 8:48 PM

## 2019-04-11 ENCOUNTER — Other Ambulatory Visit: Payer: Self-pay

## 2019-04-11 DIAGNOSIS — I5031 Acute diastolic (congestive) heart failure: Secondary | ICD-10-CM | POA: Diagnosis not present

## 2019-04-11 DIAGNOSIS — J449 Chronic obstructive pulmonary disease, unspecified: Secondary | ICD-10-CM | POA: Diagnosis not present

## 2019-04-11 DIAGNOSIS — L0390 Cellulitis, unspecified: Secondary | ICD-10-CM | POA: Diagnosis not present

## 2019-04-11 DIAGNOSIS — Z992 Dependence on renal dialysis: Secondary | ICD-10-CM | POA: Diagnosis not present

## 2019-04-11 DIAGNOSIS — I350 Nonrheumatic aortic (valve) stenosis: Secondary | ICD-10-CM

## 2019-04-12 DIAGNOSIS — N186 End stage renal disease: Secondary | ICD-10-CM | POA: Diagnosis not present

## 2019-04-12 DIAGNOSIS — Z992 Dependence on renal dialysis: Secondary | ICD-10-CM | POA: Diagnosis not present

## 2019-04-14 DIAGNOSIS — Z992 Dependence on renal dialysis: Secondary | ICD-10-CM | POA: Diagnosis not present

## 2019-04-14 DIAGNOSIS — N186 End stage renal disease: Secondary | ICD-10-CM | POA: Diagnosis not present

## 2019-04-17 ENCOUNTER — Encounter (HOSPITAL_COMMUNITY): Payer: Self-pay | Admitting: Certified Registered Nurse Anesthetist

## 2019-04-17 ENCOUNTER — Other Ambulatory Visit: Payer: Self-pay

## 2019-04-17 DIAGNOSIS — N186 End stage renal disease: Secondary | ICD-10-CM | POA: Diagnosis not present

## 2019-04-17 DIAGNOSIS — Z992 Dependence on renal dialysis: Secondary | ICD-10-CM | POA: Diagnosis not present

## 2019-04-17 MED ORDER — SODIUM CHLORIDE 0.9 % IV SOLN
1.5000 g | INTRAVENOUS | Status: AC
  Administered 2019-04-18: 1.5 g via INTRAVENOUS
  Filled 2019-04-17: qty 1.5

## 2019-04-17 MED ORDER — VANCOMYCIN HCL 10 G IV SOLR
1250.0000 mg | INTRAVENOUS | Status: AC
  Administered 2019-04-18: 1250 mg via INTRAVENOUS
  Filled 2019-04-17: qty 1250

## 2019-04-17 MED ORDER — SODIUM CHLORIDE 0.9 % IV SOLN
INTRAVENOUS | Status: DC
  Filled 2019-04-17: qty 30

## 2019-04-17 MED ORDER — NOREPINEPHRINE 4 MG/250ML-% IV SOLN
0.0000 ug/min | INTRAVENOUS | Status: AC
  Administered 2019-04-18: 2 ug/min via INTRAVENOUS
  Filled 2019-04-17: qty 250

## 2019-04-17 MED ORDER — DEXMEDETOMIDINE HCL IN NACL 400 MCG/100ML IV SOLN
0.1000 ug/kg/h | INTRAVENOUS | Status: DC
  Filled 2019-04-17: qty 100

## 2019-04-17 MED ORDER — POTASSIUM CHLORIDE 2 MEQ/ML IV SOLN
80.0000 meq | INTRAVENOUS | Status: DC
  Filled 2019-04-17: qty 40

## 2019-04-17 MED ORDER — MAGNESIUM SULFATE 50 % IJ SOLN
40.0000 meq | INTRAMUSCULAR | Status: DC
  Filled 2019-04-17: qty 9.85

## 2019-04-17 NOTE — Progress Notes (Signed)
Spoke with pt's wife, Thayer Headings for pre-op call. DPR on file. She states pt has not had any issues since his PAT appt earlier this month. She states they are driving to [place] this evening from [place]. She voices understanding that pt needs to arrive 3 hours prior to surgery so he can get his Covid test done on arrival.   Pt tested positive for MRSA/Staph. Thayer Headings states pt did the 10 doses of Mupirocin 2 weeks ago and started using it again this past Friday "just to make sure".

## 2019-04-18 ENCOUNTER — Inpatient Hospital Stay (HOSPITAL_COMMUNITY): Payer: Medicare PPO | Admitting: Vascular Surgery

## 2019-04-18 ENCOUNTER — Encounter (HOSPITAL_COMMUNITY)
Admission: RE | Disposition: A | Discharge status: Home / Self Care | Payer: Self-pay | Source: Home / Self Care | Attending: Cardiovascular Disease

## 2019-04-18 ENCOUNTER — Ambulatory Visit (HOSPITAL_COMMUNITY)
Admission: RE | Admit: 2019-04-18 | Discharge: 2019-04-18 | Disposition: A | Discharge status: Home / Self Care | Payer: Medicare PPO | Source: Ambulatory Visit | Attending: Cardiovascular Disease | Admitting: Cardiovascular Disease

## 2019-04-18 ENCOUNTER — Other Ambulatory Visit: Payer: Self-pay

## 2019-04-18 ENCOUNTER — Telehealth: Payer: Self-pay | Admitting: Cardiovascular Disease

## 2019-04-18 ENCOUNTER — Other Ambulatory Visit: Payer: Self-pay | Admitting: Physician Assistant

## 2019-04-18 ENCOUNTER — Encounter (HOSPITAL_COMMUNITY): Payer: Self-pay

## 2019-04-18 ENCOUNTER — Inpatient Hospital Stay (HOSPITAL_COMMUNITY)
Admission: RE | Admit: 2019-04-18 | Discharge: 2019-04-20 | DRG: 266 | Disposition: A | Discharge status: Home / Self Care | Payer: Medicare PPO | Attending: Cardiovascular Disease | Admitting: Cardiovascular Disease

## 2019-04-18 ENCOUNTER — Inpatient Hospital Stay (HOSPITAL_COMMUNITY): Payer: Medicare PPO

## 2019-04-18 DIAGNOSIS — Z992 Dependence on renal dialysis: Secondary | ICD-10-CM | POA: Diagnosis not present

## 2019-04-18 DIAGNOSIS — J9611 Chronic respiratory failure with hypoxia: Secondary | ICD-10-CM | POA: Diagnosis present

## 2019-04-18 DIAGNOSIS — Z79899 Other long term (current) drug therapy: Secondary | ICD-10-CM

## 2019-04-18 DIAGNOSIS — Z8719 Personal history of other diseases of the digestive system: Secondary | ICD-10-CM | POA: Diagnosis not present

## 2019-04-18 DIAGNOSIS — I10 Essential (primary) hypertension: Secondary | ICD-10-CM | POA: Diagnosis present

## 2019-04-18 DIAGNOSIS — I120 Hypertensive chronic kidney disease with stage 5 chronic kidney disease or end stage renal disease: Secondary | ICD-10-CM | POA: Diagnosis not present

## 2019-04-18 DIAGNOSIS — I132 Hypertensive heart and chronic kidney disease with heart failure and with stage 5 chronic kidney disease, or end stage renal disease: Secondary | ICD-10-CM | POA: Diagnosis not present

## 2019-04-18 DIAGNOSIS — I2510 Atherosclerotic heart disease of native coronary artery without angina pectoris: Secondary | ICD-10-CM | POA: Diagnosis present

## 2019-04-18 DIAGNOSIS — Z7902 Long term (current) use of antithrombotics/antiplatelets: Secondary | ICD-10-CM

## 2019-04-18 DIAGNOSIS — I5033 Acute on chronic diastolic (congestive) heart failure: Secondary | ICD-10-CM | POA: Diagnosis not present

## 2019-04-18 DIAGNOSIS — E1151 Type 2 diabetes mellitus with diabetic peripheral angiopathy without gangrene: Secondary | ICD-10-CM | POA: Diagnosis present

## 2019-04-18 DIAGNOSIS — Z9981 Dependence on supplemental oxygen: Secondary | ICD-10-CM | POA: Diagnosis not present

## 2019-04-18 DIAGNOSIS — N2581 Secondary hyperparathyroidism of renal origin: Secondary | ICD-10-CM | POA: Diagnosis present

## 2019-04-18 DIAGNOSIS — Z881 Allergy status to other antibiotic agents status: Secondary | ICD-10-CM

## 2019-04-18 DIAGNOSIS — Z7982 Long term (current) use of aspirin: Secondary | ICD-10-CM

## 2019-04-18 DIAGNOSIS — Z952 Presence of prosthetic heart valve: Secondary | ICD-10-CM

## 2019-04-18 DIAGNOSIS — Z20828 Contact with and (suspected) exposure to other viral communicable diseases: Secondary | ICD-10-CM | POA: Diagnosis not present

## 2019-04-18 DIAGNOSIS — Z820 Family history of epilepsy and other diseases of the nervous system: Secondary | ICD-10-CM

## 2019-04-18 DIAGNOSIS — Z7952 Long term (current) use of systemic steroids: Secondary | ICD-10-CM

## 2019-04-18 DIAGNOSIS — D631 Anemia in chronic kidney disease: Secondary | ICD-10-CM | POA: Diagnosis present

## 2019-04-18 DIAGNOSIS — I350 Nonrheumatic aortic (valve) stenosis: Secondary | ICD-10-CM | POA: Diagnosis not present

## 2019-04-18 DIAGNOSIS — E785 Hyperlipidemia, unspecified: Secondary | ICD-10-CM | POA: Diagnosis present

## 2019-04-18 DIAGNOSIS — N186 End stage renal disease: Secondary | ICD-10-CM | POA: Diagnosis not present

## 2019-04-18 DIAGNOSIS — Z006 Encounter for examination for normal comparison and control in clinical research program: Secondary | ICD-10-CM | POA: Diagnosis not present

## 2019-04-18 DIAGNOSIS — I352 Nonrheumatic aortic (valve) stenosis with insufficiency: Secondary | ICD-10-CM | POA: Diagnosis not present

## 2019-04-18 DIAGNOSIS — J439 Emphysema, unspecified: Secondary | ICD-10-CM | POA: Diagnosis present

## 2019-04-18 DIAGNOSIS — Z955 Presence of coronary angioplasty implant and graft: Secondary | ICD-10-CM

## 2019-04-18 DIAGNOSIS — Z9049 Acquired absence of other specified parts of digestive tract: Secondary | ICD-10-CM

## 2019-04-18 DIAGNOSIS — E1122 Type 2 diabetes mellitus with diabetic chronic kidney disease: Secondary | ICD-10-CM | POA: Diagnosis present

## 2019-04-18 DIAGNOSIS — I4819 Other persistent atrial fibrillation: Secondary | ICD-10-CM | POA: Diagnosis present

## 2019-04-18 DIAGNOSIS — Z8673 Personal history of transient ischemic attack (TIA), and cerebral infarction without residual deficits: Secondary | ICD-10-CM

## 2019-04-18 DIAGNOSIS — I739 Peripheral vascular disease, unspecified: Secondary | ICD-10-CM | POA: Diagnosis present

## 2019-04-18 DIAGNOSIS — I482 Chronic atrial fibrillation, unspecified: Secondary | ICD-10-CM | POA: Diagnosis present

## 2019-04-18 DIAGNOSIS — I700 Atherosclerosis of aorta: Secondary | ICD-10-CM | POA: Diagnosis present

## 2019-04-18 DIAGNOSIS — Z8249 Family history of ischemic heart disease and other diseases of the circulatory system: Secondary | ICD-10-CM

## 2019-04-18 DIAGNOSIS — D638 Anemia in other chronic diseases classified elsewhere: Secondary | ICD-10-CM | POA: Diagnosis present

## 2019-04-18 DIAGNOSIS — Z87891 Personal history of nicotine dependence: Secondary | ICD-10-CM

## 2019-04-18 DIAGNOSIS — J449 Chronic obstructive pulmonary disease, unspecified: Secondary | ICD-10-CM | POA: Diagnosis present

## 2019-04-18 HISTORY — PX: TEE WITHOUT CARDIOVERSION: SHX5443

## 2019-04-18 HISTORY — DX: Chronic atrial fibrillation, unspecified: I48.20

## 2019-04-18 HISTORY — DX: Presence of prosthetic heart valve: Z95.2

## 2019-04-18 HISTORY — PX: TRANSCATHETER AORTIC VALVE REPLACEMENT, CAROTID: SHX6798

## 2019-04-18 HISTORY — DX: Nonrheumatic aortic (valve) stenosis: I35.0

## 2019-04-18 HISTORY — DX: Personal history of other diseases of the digestive system: Z87.19

## 2019-04-18 HISTORY — DX: Chronic diastolic (congestive) heart failure: I50.32

## 2019-04-18 HISTORY — DX: Diverticulitis of intestine, part unspecified, without perforation or abscess without bleeding: K57.92

## 2019-04-18 HISTORY — DX: Anemia, unspecified: D64.9

## 2019-04-18 LAB — POCT I-STAT, CHEM 8
BUN: 38 mg/dL — ABNORMAL HIGH (ref 8–23)
BUN: 34 mg/dL — ABNORMAL HIGH (ref 8–23)
BUN: 37 mg/dL — ABNORMAL HIGH (ref 8–23)
Calcium, Ion: 1.11 mmol/L — ABNORMAL LOW (ref 1.15–1.40)
Calcium, Ion: 1.08 mmol/L — ABNORMAL LOW (ref 1.15–1.40)
Calcium, Ion: 1.13 mmol/L — ABNORMAL LOW (ref 1.15–1.40)
Chloride: 98 mmol/L (ref 98–111)
Chloride: 100 mmol/L (ref 98–111)
Chloride: 100 mmol/L (ref 98–111)
Creatinine, Ser: 5.2 mg/dL — ABNORMAL HIGH (ref 0.61–1.24)
Creatinine, Ser: 5.5 mg/dL — ABNORMAL HIGH (ref 0.61–1.24)
Creatinine, Ser: 5.6 mg/dL — ABNORMAL HIGH (ref 0.61–1.24)
Glucose, Bld: 83 mg/dL (ref 70–99)
Glucose, Bld: 108 mg/dL — ABNORMAL HIGH (ref 70–99)
Glucose, Bld: 108 mg/dL — ABNORMAL HIGH (ref 70–99)
HCT: 39 % (ref 39.0–52.0)
HCT: 33 % — ABNORMAL LOW (ref 39.0–52.0)
HCT: 39 % (ref 39.0–52.0)
Hemoglobin: 13.3 g/dL (ref 13.0–17.0)
Hemoglobin: 11.2 g/dL — ABNORMAL LOW (ref 13.0–17.0)
Hemoglobin: 13.3 g/dL (ref 13.0–17.0)
Potassium: 4 mmol/L (ref 3.5–5.1)
Potassium: 4.2 mmol/L (ref 3.5–5.1)
Potassium: 4.6 mmol/L (ref 3.5–5.1)
Sodium: 137 mmol/L (ref 135–145)
Sodium: 135 mmol/L (ref 135–145)
Sodium: 136 mmol/L (ref 135–145)
TCO2: 28 mmol/L (ref 22–32)
TCO2: 24 mmol/L (ref 22–32)
TCO2: 24 mmol/L (ref 22–32)

## 2019-04-18 LAB — BLOOD GAS, ARTERIAL
Acid-Base Excess: 1.9 mmol/L (ref 0.0–2.0)
Bicarbonate: 26.4 mmol/L (ref 20.0–28.0)
Drawn by: 421801
FIO2: 21
O2 Saturation: 94.1 %
Patient temperature: 98.6
pCO2 arterial: 44.1 mmHg (ref 32.0–48.0)
pH, Arterial: 7.394 (ref 7.350–7.450)
pO2, Arterial: 75.3 mmHg — ABNORMAL LOW (ref 83.0–108.0)

## 2019-04-18 LAB — COMPREHENSIVE METABOLIC PANEL
ALT: 15 U/L (ref 0–44)
AST: 19 U/L (ref 15–41)
Albumin: 3.5 g/dL (ref 3.5–5.0)
Alkaline Phosphatase: 67 U/L (ref 38–126)
Anion gap: 15 (ref 5–15)
BUN: 36 mg/dL — ABNORMAL HIGH (ref 8–23)
CO2: 21 mmol/L — ABNORMAL LOW (ref 22–32)
Calcium: 8.5 mg/dL — ABNORMAL LOW (ref 8.9–10.3)
Chloride: 97 mmol/L — ABNORMAL LOW (ref 98–111)
Creatinine, Ser: 5.78 mg/dL — ABNORMAL HIGH (ref 0.61–1.24)
GFR calc Af Amer: 10 mL/min — ABNORMAL LOW (ref >60)
GFR calc non Af Amer: 8 mL/min — ABNORMAL LOW (ref >60)
Glucose, Bld: 90 mg/dL (ref 70–99)
Potassium: 4.4 mmol/L (ref 3.5–5.1)
Sodium: 133 mmol/L — ABNORMAL LOW (ref 135–145)
Total Bilirubin: 0.6 mg/dL (ref 0.3–1.2)
Total Protein: 6.6 g/dL (ref 6.5–8.1)

## 2019-04-18 LAB — CBC
HCT: 42 % (ref 39.0–52.0)
Hemoglobin: 13.2 g/dL (ref 13.0–17.0)
MCH: 32.4 pg (ref 26.0–34.0)
MCHC: 31.4 g/dL (ref 30.0–36.0)
MCV: 102.9 fL — ABNORMAL HIGH (ref 80.0–100.0)
Platelets: 183 10*3/uL (ref 150–400)
RBC: 4.08 MIL/uL — ABNORMAL LOW (ref 4.22–5.81)
RDW: 19.2 % — ABNORMAL HIGH (ref 11.5–15.5)
WBC: 9.9 10*3/uL (ref 4.0–10.5)
nRBC: 0 % (ref 0.0–0.2)

## 2019-04-18 LAB — HEMOGLOBIN A1C
Hgb A1c MFr Bld: 5.7 % — ABNORMAL HIGH (ref 4.8–5.6)
Mean Plasma Glucose: 116.89 mg/dL

## 2019-04-18 LAB — TYPE AND SCREEN
ABO/RH(D): A POS
Antibody Screen: NEGATIVE

## 2019-04-18 LAB — PROTIME-INR
INR: 1 (ref 0.8–1.2)
Prothrombin Time: 13.2 seconds (ref 11.4–15.2)

## 2019-04-18 LAB — APTT: aPTT: 29 seconds (ref 24–36)

## 2019-04-18 LAB — BRAIN NATRIURETIC PEPTIDE: B Natriuretic Peptide: 260.6 pg/mL — ABNORMAL HIGH (ref 0.0–100.0)

## 2019-04-18 LAB — SARS CORONAVIRUS 2 BY RT PCR (HOSPITAL ORDER, PERFORMED IN ~~LOC~~ HOSPITAL LAB): SARS Coronavirus 2: NEGATIVE

## 2019-04-18 SURGERY — IMPLANTATION, AORTIC VALVE, TRANSCATHETER, CAROTID ARTERY APPROACH
Anesthesia: General

## 2019-04-18 MED ORDER — SODIUM CHLORIDE 0.9% FLUSH: 3.0000 mL | INTRAVENOUS | Status: DC | PRN

## 2019-04-18 MED ORDER — SODIUM CHLORIDE 0.9 % IV SOLN
INTRAVENOUS | Status: DC
  Administered 2019-04-18 (×2): via INTRAVENOUS

## 2019-04-18 MED ORDER — PREDNISONE 10 MG PO TABS
10.0000 mg | ORAL_TABLET | Freq: Every day | ORAL | Status: DC
  Administered 2019-04-20: 10 mg via ORAL
  Filled 2019-04-18: qty 1

## 2019-04-18 MED ORDER — HEPARIN SODIUM (PORCINE) 1000 UNIT/ML IJ SOLN
INTRAMUSCULAR | Status: DC | PRN
  Administered 2019-04-18: 11000 [IU] via INTRAVENOUS

## 2019-04-18 MED ORDER — PROPOFOL 10 MG/ML IV BOLUS
INTRAVENOUS | Status: DC | PRN
  Administered 2019-04-18: 20 mg via INTRAVENOUS

## 2019-04-18 MED ORDER — ASPIRIN EC 81 MG PO TBEC
81.0000 mg | DELAYED_RELEASE_TABLET | Freq: Every day | ORAL | Status: DC
  Administered 2019-04-19 – 2019-04-20 (×2): 81 mg via ORAL
  Filled 2019-04-18 (×2): qty 1

## 2019-04-18 MED ORDER — FENTANYL CITRATE (PF) 250 MCG/5ML IJ SOLN
INTRAMUSCULAR | Status: AC
  Filled 2019-04-18: qty 5

## 2019-04-18 MED ORDER — VERAPAMIL HCL 2.5 MG/ML IV SOLN
5.0000 mg | Freq: Once | INTRAVENOUS | Status: DC
  Filled 2019-04-18: qty 2

## 2019-04-18 MED ORDER — ETOMIDATE 2 MG/ML IV SOLN
INTRAVENOUS | Status: DC | PRN
  Administered 2019-04-18: 20 mg via INTRAVENOUS

## 2019-04-18 MED ORDER — IODIXANOL 320 MG/ML IV SOLN
INTRAVENOUS | Status: DC | PRN
  Administered 2019-04-18: 39.6 mL

## 2019-04-18 MED ORDER — TRAMADOL HCL 50 MG PO TABS: 50.0000 mg | ORAL_TABLET | ORAL | Status: DC | PRN

## 2019-04-18 MED ORDER — MORPHINE SULFATE (PF) 2 MG/ML IV SOLN: 1.0000 mg | INTRAVENOUS | Status: DC | PRN

## 2019-04-18 MED ORDER — NITROGLYCERIN IN D5W 200-5 MCG/ML-% IV SOLN: 0.0000 ug/min | INTRAVENOUS | Status: DC

## 2019-04-18 MED ORDER — CHLORHEXIDINE GLUCONATE 4 % EX LIQD: 30.0000 mL | CUTANEOUS | Status: DC

## 2019-04-18 MED ORDER — SODIUM CHLORIDE 0.9 % IV SOLN
INTRAVENOUS | Status: DC | PRN
  Administered 2019-04-18: 1500 mL

## 2019-04-18 MED ORDER — SODIUM CHLORIDE 0.9 % IV SOLN
INTRAVENOUS | Status: AC
  Filled 2019-04-18: qty 1.2

## 2019-04-18 MED ORDER — ALBUTEROL SULFATE (2.5 MG/3ML) 0.083% IN NEBU: 3.0000 mL | INHALATION_SOLUTION | RESPIRATORY_TRACT | Status: DC | PRN

## 2019-04-18 MED ORDER — ONDANSETRON HCL 4 MG/2ML IJ SOLN
INTRAMUSCULAR | Status: AC
  Filled 2019-04-18: qty 4

## 2019-04-18 MED ORDER — ETHYL CHLORIDE EX AERO: 1.0000 "application " | INHALATION_SPRAY | CUTANEOUS | Status: DC

## 2019-04-18 MED ORDER — DM-GUAIFENESIN ER 30-600 MG PO TB12: 1.0000 | ORAL_TABLET | Freq: Two times a day (BID) | ORAL | Status: DC | PRN

## 2019-04-18 MED ORDER — NITROGLYCERIN 0.2 MG/ML ON CALL CATH LAB
INTRAVENOUS | Status: DC | PRN
  Administered 2019-04-18 (×5): 20 ug via INTRAVENOUS

## 2019-04-18 MED ORDER — PROTAMINE SULFATE 10 MG/ML IV SOLN
INTRAVENOUS | Status: DC | PRN
  Administered 2019-04-18: 110 mg via INTRAVENOUS

## 2019-04-18 MED ORDER — SODIUM CHLORIDE 0.9 % IV SOLN: 1.5000 g | INTRAVENOUS | Status: DC

## 2019-04-18 MED ORDER — SODIUM CHLORIDE 0.9 % IV SOLN
1.5000 g | Freq: Two times a day (BID) | INTRAVENOUS | Status: DC
  Administered 2019-04-18: 1.5 g via INTRAVENOUS
  Filled 2019-04-18 (×2): qty 1.5

## 2019-04-18 MED ORDER — CHLORHEXIDINE GLUCONATE 4 % EX LIQD: 60.0000 mL | Freq: Once | CUTANEOUS | Status: DC

## 2019-04-18 MED ORDER — PHENYLEPHRINE HCL-NACL 20-0.9 MG/250ML-% IV SOLN: 0.0000 ug/min | INTRAVENOUS | Status: DC

## 2019-04-18 MED ORDER — LIDOCAINE HCL 1 % IJ SOLN: INTRAMUSCULAR | Status: DC | PRN

## 2019-04-18 MED ORDER — SODIUM CHLORIDE 0.9% FLUSH
3.0000 mL | Freq: Two times a day (BID) | INTRAVENOUS | Status: DC
  Administered 2019-04-18 – 2019-04-20 (×4): 3 mL via INTRAVENOUS

## 2019-04-18 MED ORDER — ATORVASTATIN CALCIUM 80 MG PO TABS
80.0000 mg | ORAL_TABLET | Freq: Every day | ORAL | Status: DC
  Administered 2019-04-18 – 2019-04-19 (×2): 80 mg via ORAL
  Filled 2019-04-18 (×2): qty 1

## 2019-04-18 MED ORDER — CHLORHEXIDINE GLUCONATE 0.12 % MT SOLN
15.0000 mL | Freq: Once | OROMUCOSAL | Status: AC
  Administered 2019-04-18: 15 mL via OROMUCOSAL
  Filled 2019-04-18: qty 15

## 2019-04-18 MED ORDER — ROCURONIUM BROMIDE 100 MG/10ML IV SOLN
INTRAVENOUS | Status: DC | PRN
  Administered 2019-04-18: 50 mg via INTRAVENOUS
  Administered 2019-04-18: 20 mg via INTRAVENOUS
  Administered 2019-04-18: 30 mg via INTRAVENOUS

## 2019-04-18 MED ORDER — FENTANYL CITRATE (PF) 100 MCG/2ML IJ SOLN
INTRAMUSCULAR | Status: DC | PRN
  Administered 2019-04-18: 50 ug via INTRAVENOUS
  Administered 2019-04-18: 75 ug via INTRAVENOUS
  Administered 2019-04-18: 25 ug via INTRAVENOUS

## 2019-04-18 MED ORDER — PROTAMINE SULFATE 10 MG/ML IV SOLN
INTRAVENOUS | Status: AC
  Filled 2019-04-18: qty 5

## 2019-04-18 MED ORDER — DEXAMETHASONE SODIUM PHOSPHATE 10 MG/ML IJ SOLN
INTRAMUSCULAR | Status: AC
  Filled 2019-04-18: qty 1

## 2019-04-18 MED ORDER — FENTANYL CITRATE (PF) 100 MCG/2ML IJ SOLN: 25.0000 ug | INTRAMUSCULAR | Status: DC | PRN

## 2019-04-18 MED ORDER — PHENYLEPHRINE 40 MCG/ML (10ML) SYRINGE FOR IV PUSH (FOR BLOOD PRESSURE SUPPORT)
PREFILLED_SYRINGE | INTRAVENOUS | Status: AC
  Filled 2019-04-18: qty 10

## 2019-04-18 MED ORDER — CHLORHEXIDINE GLUCONATE CLOTH 2 % EX PADS: 6.0000 | MEDICATED_PAD | Freq: Every day | CUTANEOUS | Status: DC

## 2019-04-18 MED ORDER — SODIUM CHLORIDE 0.9 % IV SOLN
1.5000 g | INTRAVENOUS | Status: AC
  Administered 2019-04-19: 1.5 g via INTRAVENOUS
  Filled 2019-04-18: qty 1.5

## 2019-04-18 MED ORDER — ONDANSETRON HCL 4 MG/2ML IJ SOLN: 4.0000 mg | Freq: Four times a day (QID) | INTRAMUSCULAR | Status: DC | PRN

## 2019-04-18 MED ORDER — MORPHINE SULFATE (PF) 10 MG/ML IV SOLN
1.0000 mg | INTRAVENOUS | Status: DC | PRN
  Administered 2019-04-18: 2 mg via INTRAVENOUS

## 2019-04-18 MED ORDER — PROTAMINE SULFATE 10 MG/ML IV SOLN
INTRAVENOUS | Status: AC
  Filled 2019-04-18: qty 25

## 2019-04-18 MED ORDER — LIDOCAINE HCL (PF) 1 % IJ SOLN
INTRAMUSCULAR | Status: AC
  Filled 2019-04-18: qty 30

## 2019-04-18 MED ORDER — SODIUM CHLORIDE 0.9 % IV SOLN: INTRAVENOUS | Status: DC

## 2019-04-18 MED ORDER — CALCIUM ACETATE (PHOS BINDER) 667 MG PO CAPS
667.0000 mg | ORAL_CAPSULE | ORAL | Status: DC
  Administered 2019-04-20: 667 mg via ORAL
  Filled 2019-04-18: qty 1

## 2019-04-18 MED ORDER — PANTOPRAZOLE SODIUM 40 MG PO TBEC
40.0000 mg | DELAYED_RELEASE_TABLET | Freq: Two times a day (BID) | ORAL | Status: DC
  Administered 2019-04-19 – 2019-04-20 (×2): 40 mg via ORAL
  Filled 2019-04-18 (×2): qty 1

## 2019-04-18 MED ORDER — OXYCODONE HCL 5 MG PO TABS: 5.0000 mg | ORAL_TABLET | Freq: Once | ORAL | Status: DC | PRN

## 2019-04-18 MED ORDER — OXYCODONE HCL 5 MG PO TABS
5.0000 mg | ORAL_TABLET | ORAL | Status: DC | PRN
  Administered 2019-04-19: 10 mg via ORAL
  Filled 2019-04-18: qty 2

## 2019-04-18 MED ORDER — ONDANSETRON HCL 4 MG/2ML IJ SOLN
INTRAMUSCULAR | Status: DC | PRN
  Administered 2019-04-18: 4 mg via INTRAVENOUS

## 2019-04-18 MED ORDER — CLOPIDOGREL BISULFATE 75 MG PO TABS
75.0000 mg | ORAL_TABLET | Freq: Every day | ORAL | Status: DC
  Administered 2019-04-19 – 2019-04-20 (×2): 75 mg via ORAL
  Filled 2019-04-18 (×2): qty 1

## 2019-04-18 MED ORDER — MOMETASONE FURO-FORMOTEROL FUM 200-5 MCG/ACT IN AERO
2.0000 | INHALATION_SPRAY | Freq: Two times a day (BID) | RESPIRATORY_TRACT | Status: DC
  Administered 2019-04-18 – 2019-04-20 (×3): 2 via RESPIRATORY_TRACT
  Filled 2019-04-18: qty 8.8

## 2019-04-18 MED ORDER — ACETAMINOPHEN 650 MG RE SUPP: 650.0000 mg | Freq: Four times a day (QID) | RECTAL | Status: DC | PRN

## 2019-04-18 MED ORDER — MORPHINE SULFATE (PF) 2 MG/ML IV SOLN
INTRAVENOUS | Status: AC
  Filled 2019-04-18: qty 1

## 2019-04-18 MED ORDER — 0.9 % SODIUM CHLORIDE (POUR BTL) OPTIME
TOPICAL | Status: DC | PRN
  Administered 2019-04-18: 2000 mL

## 2019-04-18 MED ORDER — LORATADINE 10 MG PO TABS
10.0000 mg | ORAL_TABLET | Freq: Every day | ORAL | Status: DC
  Filled 2019-04-18: qty 1

## 2019-04-18 MED ORDER — LIDOCAINE 2% (20 MG/ML) 5 ML SYRINGE
INTRAMUSCULAR | Status: DC | PRN
  Administered 2019-04-18: 60 mg via INTRAVENOUS

## 2019-04-18 MED ORDER — HEPARIN SODIUM (PORCINE) 1000 UNIT/ML IJ SOLN
INTRAMUSCULAR | Status: AC
  Filled 2019-04-18: qty 1

## 2019-04-18 MED ORDER — MIDODRINE HCL 5 MG PO TABS
10.0000 mg | ORAL_TABLET | ORAL | Status: DC
  Administered 2019-04-19: 10 mg via ORAL
  Filled 2019-04-18: qty 2

## 2019-04-18 MED ORDER — PHENYLEPHRINE 40 MCG/ML (10ML) SYRINGE FOR IV PUSH (FOR BLOOD PRESSURE SUPPORT)
PREFILLED_SYRINGE | INTRAVENOUS | Status: DC | PRN
  Administered 2019-04-18: 80 ug via INTRAVENOUS
  Administered 2019-04-18 (×2): 40 ug via INTRAVENOUS

## 2019-04-18 MED ORDER — HEMOSTATIC AGENTS (NO CHARGE) OPTIME
TOPICAL | Status: DC | PRN
  Administered 2019-04-18: 1 via TOPICAL

## 2019-04-18 MED ORDER — SODIUM CHLORIDE 0.9 % IV SOLN: 250.0000 mL | INTRAVENOUS | Status: DC | PRN

## 2019-04-18 MED ORDER — VANCOMYCIN HCL IN DEXTROSE 500-5 MG/100ML-% IV SOLN
500.0000 mg | Freq: Once | INTRAVENOUS | Status: AC
  Administered 2019-04-18: 500 mg via INTRAVENOUS
  Filled 2019-04-18: qty 100

## 2019-04-18 MED ORDER — CALCIUM ACETATE (PHOS BINDER) 667 MG PO CAPS
1334.0000 mg | ORAL_CAPSULE | Freq: Three times a day (TID) | ORAL | Status: DC
  Administered 2019-04-19 – 2019-04-20 (×3): 1334 mg via ORAL
  Filled 2019-04-18 (×4): qty 2

## 2019-04-18 MED ORDER — SODIUM CHLORIDE 0.9 % IV SOLN
INTRAVENOUS | Status: AC
  Administered 2019-04-18: 17:00:00 via INTRAVENOUS

## 2019-04-18 MED ORDER — DEXAMETHASONE SODIUM PHOSPHATE 10 MG/ML IJ SOLN
INTRAMUSCULAR | Status: DC | PRN
  Administered 2019-04-18: 10 mg via INTRAVENOUS

## 2019-04-18 MED ORDER — OXYCODONE HCL 5 MG/5ML PO SOLN: 5.0000 mg | Freq: Once | ORAL | Status: DC | PRN

## 2019-04-18 MED ORDER — SUGAMMADEX SODIUM 200 MG/2ML IV SOLN
INTRAVENOUS | Status: DC | PRN
  Administered 2019-04-18: 150 mg via INTRAVENOUS

## 2019-04-18 MED ORDER — ACETAMINOPHEN 325 MG PO TABS
650.0000 mg | ORAL_TABLET | Freq: Four times a day (QID) | ORAL | Status: DC | PRN
  Administered 2019-04-19: 650 mg via ORAL
  Filled 2019-04-18: qty 2

## 2019-04-18 SURGICAL SUPPLY — 92 items
BAG DECANTER FOR FLEXI CONT (MISCELLANEOUS) IMPLANT
BAG SNAP BAND KOVER 36X36 (MISCELLANEOUS) ×4 IMPLANT
BLADE CLIPPER SURG (BLADE) ×1 IMPLANT
BLADE OSCILLATING /SAGITTAL (BLADE) IMPLANT
BLADE STERNUM SYSTEM 6 (BLADE) IMPLANT
CABLE ADAPT CONN TEMP 6FT (ADAPTER) ×3 IMPLANT
CANNULA FEM VENOUS REMOTE 22FR (CANNULA) IMPLANT
CANNULA OPTISITE PERFUSION 16F (CANNULA) IMPLANT
CANNULA OPTISITE PERFUSION 18F (CANNULA) IMPLANT
CATH DIAG EXPO 6F AL1 (CATHETERS) IMPLANT
CATH DIAG EXPO 6F VENT PIG 145 (CATHETERS) ×6 IMPLANT
CATH EXTERNAL FEMALE PUREWICK (CATHETERS) IMPLANT
CATH INFINITI 6F AL2 (CATHETERS) ×1 IMPLANT
CATH S G BIP PACING (CATHETERS) ×3 IMPLANT
CLIP VESOCCLUDE MED 24/CT (CLIP) ×1 IMPLANT
CLIP VESOCCLUDE SM WIDE 24/CT (CLIP) ×1 IMPLANT
CONT SPEC 4OZ CLIKSEAL STRL BL (MISCELLANEOUS) ×6 IMPLANT
COVER BACK TABLE 24X17X13 BIG (DRAPES) IMPLANT
COVER BACK TABLE 80X110 HD (DRAPES) ×6 IMPLANT
COVER DOME SNAP 22 D (MISCELLANEOUS) ×1 IMPLANT
COVER WAND RF STERILE (DRAPES) ×2 IMPLANT
DERMABOND ADVANCED (GAUZE/BANDAGES/DRESSINGS) ×1
DERMABOND ADVANCED .7 DNX12 (GAUZE/BANDAGES/DRESSINGS) ×2 IMPLANT
DEVICE CLOSURE PERCLS PRGLD 6F (VASCULAR PRODUCTS) ×4 IMPLANT
DRAPE INCISE IOBAN 66X45 STRL (DRAPES) ×1 IMPLANT
DRSG TEGADERM 4X4.75 (GAUZE/BANDAGES/DRESSINGS) ×6 IMPLANT
ELECT CAUTERY BLADE 6.4 (BLADE) ×1 IMPLANT
ELECT REM PT RETURN 9FT ADLT (ELECTROSURGICAL) ×3
ELECTRODE REM PT RTRN 9FT ADLT (ELECTROSURGICAL) ×4 IMPLANT
FELT TEFLON 6X6 (MISCELLANEOUS) IMPLANT
FEMORAL VENOUS CANN RAP (CANNULA) IMPLANT
GAUZE SPONGE 2X2 8PLY STRL LF (GAUZE/BANDAGES/DRESSINGS) IMPLANT
GAUZE SPONGE 4X4 12PLY STRL (GAUZE/BANDAGES/DRESSINGS) ×3 IMPLANT
GLIDESHEATH SLEND SS 6F .021 (SHEATH) ×1 IMPLANT
GLOVE BIO SURGEON STRL SZ7.5 (GLOVE) IMPLANT
GLOVE BIO SURGEON STRL SZ8 (GLOVE) IMPLANT
GLOVE EUDERMIC 7 POWDERFREE (GLOVE) IMPLANT
GLOVE ORTHO TXT STRL SZ7.5 (GLOVE) IMPLANT
GOWN STRL REUS W/ TWL LRG LVL3 (GOWN DISPOSABLE) IMPLANT
GOWN STRL REUS W/ TWL XL LVL3 (GOWN DISPOSABLE) ×2 IMPLANT
GOWN STRL REUS W/TWL LRG LVL3 (GOWN DISPOSABLE) ×3
GOWN STRL REUS W/TWL XL LVL3 (GOWN DISPOSABLE) ×2
GUIDEWIRE SAFE TJ AMPLATZ EXST (WIRE) ×3 IMPLANT
HEMOSTAT SURGICEL 2X14 (HEMOSTASIS) ×1 IMPLANT
INSERT FOGARTY SM (MISCELLANEOUS) IMPLANT
KIT BASIN OR (CUSTOM PROCEDURE TRAY) ×3 IMPLANT
KIT DILATOR VASC 18G NDL (KITS) ×1 IMPLANT
KIT HEART LEFT (KITS) ×3 IMPLANT
KIT SUCTION CATH 14FR (SUCTIONS) IMPLANT
KIT TURNOVER KIT B (KITS) ×3 IMPLANT
LOOP VESSEL MAXI BLUE (MISCELLANEOUS) ×1 IMPLANT
LOOP VESSEL MINI RED (MISCELLANEOUS) ×1 IMPLANT
NDL PERC 18GX7CM (NEEDLE) ×2 IMPLANT
NEEDLE 22X1 1/2 (OR ONLY) (NEEDLE) IMPLANT
NEEDLE PERC 18GX7CM (NEEDLE) ×3 IMPLANT
NS IRRIG 1000ML POUR BTL (IV SOLUTION) ×9 IMPLANT
PACK ENDOVASCULAR (PACKS) ×3 IMPLANT
PAD ARMBOARD 7.5X6 YLW CONV (MISCELLANEOUS) ×6 IMPLANT
PAD ELECT DEFIB RADIOL ZOLL (MISCELLANEOUS) ×3 IMPLANT
PENCIL BUTTON HOLSTER BLD 10FT (ELECTRODE) ×3 IMPLANT
PERCLOSE PROGLIDE 6F (VASCULAR PRODUCTS) ×6
POSITIONER HEAD DONUT 9IN (MISCELLANEOUS) ×3 IMPLANT
SET MICROPUNCTURE 5F STIFF (MISCELLANEOUS) ×3 IMPLANT
SHEATH BRITE TIP 6FR 35CM (SHEATH) ×3 IMPLANT
SHEATH PINNACLE 6F 10CM (SHEATH) ×3 IMPLANT
SHEATH PINNACLE 8F 10CM (SHEATH) ×3 IMPLANT
SLEEVE REPOSITIONING LENGTH 30 (MISCELLANEOUS) ×3 IMPLANT
SPONGE GAUZE 2X2 STER 10/PKG (GAUZE/BANDAGES/DRESSINGS) ×1
SPONGE LAP 4X18 RFD (DISPOSABLE) ×1 IMPLANT
STOPCOCK MORSE 400PSI 3WAY (MISCELLANEOUS) ×6 IMPLANT
SUT ETHIBOND X763 2 0 SH 1 (SUTURE) IMPLANT
SUT GORETEX CV 4 TH 22 36 (SUTURE) IMPLANT
SUT GORETEX CV4 TH-18 (SUTURE) IMPLANT
SUT MNCRL AB 3-0 PS2 18 (SUTURE) IMPLANT
SUT PROLENE 5 0 C 1 36 (SUTURE) ×2 IMPLANT
SUT PROLENE 6 0 C 1 30 (SUTURE) IMPLANT
SUT SILK  1 MH (SUTURE) ×1
SUT SILK 1 MH (SUTURE) ×2 IMPLANT
SUT VIC AB 2-0 CT1 27 (SUTURE)
SUT VIC AB 2-0 CT1 TAPERPNT 27 (SUTURE) IMPLANT
SUT VIC AB 2-0 CTX 36 (SUTURE) IMPLANT
SUT VIC AB 3-0 SH 8-18 (SUTURE) ×1 IMPLANT
SYR 50ML LL SCALE MARK (SYRINGE) ×3 IMPLANT
SYR BULB IRRIGATION 50ML (SYRINGE) IMPLANT
SYR CONTROL 10ML LL (SYRINGE) IMPLANT
TOWEL GREEN STERILE (TOWEL DISPOSABLE) ×3 IMPLANT
TOWEL GREEN STERILE FF (TOWEL DISPOSABLE) ×3 IMPLANT
TRANSDUCER W/STOPCOCK (MISCELLANEOUS) ×6 IMPLANT
TRAY FOLEY SLVR 16FR TEMP STAT (SET/KITS/TRAYS/PACK) IMPLANT
VALVE HRT TRANSCATH CERT 26MM (Valve) ×1 IMPLANT
WIRE EMERALD 3MM-J .035X150CM (WIRE) ×3 IMPLANT
WIRE EMERALD 3MM-J .035X260CM (WIRE) ×3 IMPLANT

## 2019-04-18 NOTE — Op Note (Signed)
HEART AND VASCULAR CENTER   MULTIDISCIPLINARY HEART VALVE TEAM   TAVR OPERATIVE NOTE   Date of Procedure:  04/18/2019  Preoperative Diagnosis: Severe Aortic Stenosis   Postoperative Diagnosis: Same   Procedure:    Transcatheter Aortic Valve Replacement - Open Left Transcarotid Approach  Edwards Sapien 3 THV (size 26 mm, model # 9600TFX, serial # [id number])   Co-Surgeons:  Valentina Gu. Roxy Manns, MD and Lauree Chandler, MD  Anesthesiologist:  Albertha Ghee, MD  Echocardiographer:  Sanda Klein, MD  Pre-operative Echo Findings:  Severe aortic stenosis  Normal left ventricular systolic function  Post-operative Echo Findings:  No paravalvular leak  Normal left ventricular systolic function   BRIEF CLINICAL NOTE AND INDICATIONS FOR SURGERY  Patient is an 84 year old obese white male with complex past medical history including history of aortic stenosis, coronary artery disease status post multiple previous PCI and stenting procedures of the right coronary artery, chronic diastolic congestive heart failure, hypertension, end-stage renal disease on hemodialysis, COPD with home oxygen use, degenerative arthritis with limited mobility, peripheral arterial disease, persistent atrial fibrillation not currently on warfarin or DOAC anticoagulation, and recent upper GI bleed with been referred for surgical consultation to discuss treatment options for management of severe aortic stenosis.  Patient's cardiac history dates back more than 20 years ago when he first presented with ischemic heart disease.  He underwent PCI and stenting of the right coronary artery at that time.  He developed paroxysmal atrial fibrillation in December 2015 and was initially anticoagulated using warfarin but had trouble tolerating it.  He was switched to Eliquis but this was stopped because of progressive renal failure.  In March 2019 he was hospitalized in [place].  Echocardiogram revealed moderate to  severe aortic stenosis with mean transvalvular gradient reported 21 mmHg but aortic valve area calculated only 0.7 cm.  Diagnostic cardiac catheterization performed at that time revealed moderate nonobstructive disease in the left anterior descending coronary artery and the left circumflex but high-grade stenosis in the right coronary artery.  He underwent rotational atherectomy of the ostial and mid right coronary artery with stenting using multiple drug-eluting stents at [facility] in [place].  In July 2019 he developed acute left lower extremity ischemia for which she was treated by Dr. early with femoral endarterectomy.  Later that year he was hospitalized at [facility] in [place] due to pneumonia.  Echocardiogram performed at that time was reported to demonstrate moderate aortic stenosis.  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  He has not had any recurrence.  The patient was recently seen in follow-up by Dr. [name].  He describes stable but progressive symptoms of exertional shortness of breath.  Follow-up transthoracic echocardiogram performed February 16, 2019 revealed significant progression in the severity of aortic stenosis with preserved left ventricular systolic function.  Peak velocity across the aortic valve was reported 3.5 m/s corresponding to mean transvalvular gradient estimated 29 mmHg but aortic valve area calculated only 0.53 cm with very low DVI reported 0.19.  Ejection fraction was estimated 55 to 60%.  Patient underwent diagnostic cardiac catheterization February 21, 2019 and revealed severe single-vessel coronary artery disease including moderate nonobstructive disease in the left coronary circulation but high-grade 90% proximal and 95% mid stenosis of the right coronary artery.   Pulmonary artery pressures were mildly elevated.  Peak  to peak and mean transvalvular gradients were reported 17 and 17.46 mmHg, respectively.  The patient underwent successful primary balloon angioplasty of the high-grade stenosis in the right coronary artery.  CT angiography was performed and the patient was referred for surgical consultation.  During the course of the patient's preoperative work up they have been evaluated comprehensively by a multidisciplinary team of specialists coordinated through the [facility] in the [facility].  They have been demonstrated to suffer from symptomatic severe aortic stenosis as noted above. The patient has been counseled extensively as to the relative risks and benefits of all options for the treatment of severe aortic stenosis including long term medical therapy, conventional surgery for aortic valve replacement, and transcatheter aortic valve replacement.  All questions have been answered, and the patient provides full informed consent for the operation as described.   DETAILS OF THE OPERATIVE PROCEDURE  PREPARATION:    The patient is brought to the operating room on the above mentioned date and central monitoring was established by the anesthesia team including placement of a central venous line and radial arterial line. The patient is placed in the supine position on the operating table.  Intravenous antibiotics are administered.  General endotracheal anesthesia is induced uneventfully.  A Foley catheter is placed.  Baseline transesophageal echocardiogram was performed. The patient's neck, chest, abdomen, both groins, and both lower extremities are prepared and draped in a sterile manner. A time out procedure is performed.   PERIPHERAL ACCESS:    Using the modified Seldinger technique, femoral venous access was obtained with placement of 6 Fr sheaths on the right side.  A temporary transvenous pacemaker  catheter was passed through the right femoral venous sheath under fluoroscopic guidance into the right ventricle.  The pacemaker was tested to ensure stable lead placement and pacemaker capture.  A 6 French arterial sheath was placed into the left radial artery under ultrasound guidance by Dr. [name].  A pigtail diagnostic catheter was passed through the radial arterial sheath under fluoroscopic guidance into the aortic root.  Aortic root angiography was performed in order to determine the optimal angiographic angle for valve deployment.   TRANSCAROTID ACCESS:   A small transverse incision is made overlying the left common carotid artery approximately 2 cm above the sternal notch.  The incision is completed through the platysma muscle with electrocautery.  The anterior border of the sternocleidomastoid muscle was dissected laterally to expose the carotid artery and internal jugular vein.  Atraumatic vessel loops were placed around the carotid artery.  A pair of concentric pledgeted CV 4 Gore-Tex sutures are placed into the anterior surface of the common carotid artery at the site of planned puncture with concentric diamond shaped designed to avoid lateral constriction of the artery following closure.  The patient was heparinized systemically and ACT verified > 250 seconds.  The right common carotid artery was cannulated under direct vision and a soft J-wire passed into the proximal aortic arch under fluoroscopic guidance.  A 6 French sheath was passed over the guidewire into the carotid artery.  Through the carotid sheath an AL-2 catheter was used to direct a straight-tip exchange length wire across the native aortic valve into the left ventricle. This was exchanged out for a pigtail catheter and position was confirmed in the LV apex.  The pigtail catheter was exchanged for an Amplatz Extra-stiff wire in the LV apex.  Echocardiography was utilized to confirm appropriate wire position and no sign  of  entanglement in the mitral subvalvular apparatus.  An 15 [place] Edwards Certitude alternative access dilator and sheath were advanced over the extra-stiff wire until the tip of the sheath was within the aortic arch proximal to the level of the takeoff of the carotid artery.  The dilator was removed and the sheath flushed with saline.   TRANSCATHETER HEART VALVE DEPLOYMENT:   An Edwards Sapien 3 transcatheter heart valve (size 26 mm, model #9600TFX, serial #[id number]) was prepared and crimped per manufacturer's guidelines, and the proper orientation of the valve is confirmed on the [place] Certitude delivery system. The valve was advanced through the sheath into the proximal aortic arch. The valve was carefully positioned across the aortic valve annulus. Once final position of the valve has been confirmed by angiographic assessment, the valve is deployed while temporarily holding ventilation and during rapid ventricular pacing to maintain systolic blood pressure < 50 mmHg and pulse pressure < 10 mmHg. The balloon inflation is held for >3 seconds after reaching full deployment volume. Once the balloon has fully deflated the balloon is retracted into the ascending aorta and valve function is assessed using echocardiography. There is felt to be no paravalvular leak and no central aortic insufficiency.  The patient's hemodynamic recovery following valve deployment is good.  The deployment balloon and guidewire are both removed.    PROCEDURE COMPLETION:   The sheath was removed and carotid artery closure performed by securing the Gore-tex pursestring sutures.  Protamine was administered once carotid arterial repair was complete. The temporary pacemaker, pigtail catheters and both radial and femoral sheaths were removed with manual pressure used for hemostasis.   The patient tolerated the procedure well and is transported to the surgical intensive care in stable condition. There were no immediate intraoperative  complications. All sponge instrument and needle counts are verified correct at completion of the operation.   No blood products were administered during the operation.  The patient received a total of 40 mL of intravenous contrast during the procedure.   Rexene Alberts, MD 04/18/2019 1:05 PM

## 2019-04-18 NOTE — Consult Note (Addendum)
[facility] Renal Consultation Note    Indication for Consultation:  Management of ESRD/hemodialysis; anemia, hypertension/volume and secondary hyperparathyroidism  PCP:Kim, Nickola Major, DO  HPI: Terry [name]. is a 84 y.o. male. ESRD 2/2 DM, on HD MWF at [facility].  Past medical history significant for PAF, HTN, HLD, prior CVA, COPD/emphysema, multivessel CAD and severe AS.   Patient presented today for planned TAVR. Seen and examined post procedure in the cath lab.  Reports he feels terrible.  Denies SOB, CP and n/v/d.  Last dialysis was completed on 10/26.  Labs completed today are as follows; K 4.6, SCr 5.6, BUN 37 and hemoglobin 13.3.  He has been admitted for further evaluation and treatment.    Past Medical History:  Diagnosis Date  . Anemia   . Anxiety   . Arthritis    "hands, knees" (03/15/2017)  . CAD (coronary artery disease) [place], [name]  . Chronic atrial fibrillation ([place])   . Chronic diastolic (congestive) heart failure ([place])   . COPD (chronic obstructive pulmonary disease) ([place])   . Diverticulitis   . ESRD (end stage renal disease) on dialysis [facility])    "MWF; [facility] [place]" (03/15/2017)     [place] URINE  . History of CVA (cerebrovascular accident) 2012   denies residual on 03/15/2017  . History of GI bleed   . HLD (hyperlipidemia)   . Hypertension   . On home oxygen therapy    "1L typically at night" (03/15/2017)  . Osteoarthritis 10/22/2012  . PAD (peripheral artery disease) ([place])   . S/P TAVR (transcatheter aortic valve replacement)    s/p TAVR with a 26 mm Edwards Sapien 3 via the carotid approach by Dr. Roxy Manns and Dr. [name]  . Severe aortic stenosis    Past Surgical History:  Procedure Laterality Date  . ABDOMINAL AORTOGRAM N/A 12/16/2017   Procedure: ABDOMINAL AORTOGRAM;  Surgeon: Conrad Willowbrook, MD;  Location: [facility];  Service: Cardiovascular;  Laterality: N/A;  . APPENDECTOMY  1984  . AV FISTULA PLACEMENT  Right 05/21/2015   Procedure: Right Arm Brachiocephalic ARTERIOVENOUS (AV) FISTULA CREATION;  Surgeon: Angelia Mould, MD;  Location: [facility];  Service: Vascular;  Laterality: Right;  . CATARACT EXTRACTION W/ INTRAOCULAR LENS  IMPLANT, BILATERAL Bilateral   . COLECTOMY  2012   partial colon removed - twisted bowel  . CORONARY ANGIOPLASTY WITH STENT PLACEMENT  1999   2 stents  . CORONARY BALLOON ANGIOPLASTY N/A 02/21/2019   Procedure: CORONARY BALLOON ANGIOPLASTY;  Surgeon: Burnell Blanks, MD;  Location: [facility];  Service: Cardiovascular;  Laterality: N/A;  . CYSTOSCOPY/RETROGRADE/URETEROSCOPY Bilateral 04/23/2014   Procedure: CYSTOSCOPY BILATERAL RETROGRADE,  LEFT URETEROSCOPY, LEFT STENT PLACEMENT;  Surgeon: Raynelle Bring, MD;  Location: [facility];  Service: Urology;  Laterality: Bilateral;  . ENDARTERECTOMY FEMORAL Left 12/29/2017   Procedure: Left Femoral ENDARTERECTOMY with Patch Angioplasty using Xenosure patch;  Surgeon: Rosetta Posner, MD;  Location: [place];  Service: Vascular;  Laterality: Left;  . EYE SURGERY Left    "surgery to clean off lens after cataract OR"  . INGUINAL HERNIA REPAIR Right   . LOWER EXTREMITY ANGIOGRAPHY Bilateral 12/16/2017   Procedure: LOWER EXTREMITY ANGIOGRAPHY;  Surgeon: Conrad , MD;  Location: [facility];  Service: Cardiovascular;  Laterality: Bilateral;  . RIGHT/LEFT HEART CATH AND CORONARY ANGIOGRAPHY N/A 02/21/2019   Procedure: RIGHT/LEFT HEART CATH AND CORONARY ANGIOGRAPHY;  Surgeon: Burnell Blanks, MD;  Location:  [facility];  Service: Cardiovascular;  Laterality: N/A;   Family History  Problem Relation Age of Onset  . Cancer Mother        Breast  . Heart disease Father   . Heart attack Father   . Parkinson's disease Brother    Social History:  reports that he quit smoking about 19 years ago. His smoking use included cigarettes, pipe, and cigars. He has a 49.00 pack-year smoking history. He has quit  using smokeless tobacco.  His smokeless tobacco use included chew. He reports current alcohol use. He reports that he does not use drugs. Allergies  Allergen Reactions  . Yellow Dyes (Non-Tartrazine) Other (See Comments)    Burns arms; Cough medications (tussionex and benzonatate ok) specific - yellow dye used for ophthalmology procedure   . Levaquin [Levofloxacin In D5w] Itching  . Levofloxacin Nausea And Vomiting   Prior to Admission medications   Medication Sig Start Date End Date Taking? Authorizing Provider  albuterol (PROVENTIL HFA;VENTOLIN HFA) 108 (90 Base) MCG/ACT inhaler Inhale 2 puffs into the lungs every 4 (four) hours as needed for wheezing or shortness of breath. 11/21/17  Yes Orlie Dakin, MD  atorvastatin (LIPITOR) 80 MG tablet Take 1 tablet by mouth once daily Patient taking differently: Take 80 mg by mouth at bedtime.  12/16/18  Yes Burnell Blanks, MD  calcium acetate (PHOSLO) 667 MG capsule Take [phone] mg by mouth See admin instructions. Take 2 capsules (1334 mg) by moth 3 times daily with each meal & take 1 capsule (667 mg) by mouth with each snack   Yes [provider]  cetirizine (ZYRTEC) 10 MG tablet Take 10 mg by mouth daily as needed for allergies.    Yes [provider]  clopidogrel (PLAVIX) 75 MG tablet Take 1 tablet by mouth once daily Patient taking differently: Take 75 mg by mouth daily.  11/16/18  Yes Burnell Blanks, MD  ethyl chloride spray Apply 1 application topically every Monday, Wednesday, and Friday.  05/28/17  Yes [provider]  metoprolol succinate (TOPROL-XL) 50 MG 24 hr tablet Take 1 tablet (50 mg total) by mouth 2 (two) times daily. Take with or immediately following a meal. Patient taking differently: Take 25-50 mg by mouth See admin instructions. Take 1 tablet (50 mg) by mouth on Sundays, Tuesdays, Thursdays, & Saturdays in the morning Take 0.5 tablet (25 mg) by mouth on Mondays, Wednesdays, Fridays, &  Saturdays nights. 03/31/18  Yes Burnell Blanks, MD  midodrine (PROAMATINE) 10 MG tablet Take 10 mg by mouth See admin instructions. Take 10 mg by mouth on Mon/Wed/Fri at dialysis as needed for low blood pressure   Yes [provider]  OXYGEN Inhale 1 L into the lungs daily as needed (for shortness of breath).    Yes [provider]  oxymetazoline (AFRIN) 0.05 % nasal spray Place 1 spray into both nostrils once as needed (to stop nose bleeds).    Yes [provider]  pantoprazole (PROTONIX) 40 MG tablet Take 40 mg by mouth 2 (two) times daily before a meal. Breakfast & supper   Yes [provider]  predniSONE (DELTASONE) 10 MG tablet Take 1 tablet (10 mg total) by mouth daily with breakfast. 12/06/18  Yes Tanda Rockers, MD  SYMBICORT 160-4.5 MCG/ACT inhaler Inhale 2 puffs by mouth twice daily 02/17/19  Yes Tanda Rockers, MD  acetaminophen (TYLENOL) 500 MG tablet Take 1,000 mg by mouth every 6 (six) hours as needed for  mild pain or headache.     [provider]  aspirin EC 81 MG tablet Take 81 mg by mouth See admin instructions. Takes Sun, Tues, Thurs and Sat    [provider]  dextromethorphan-guaiFENesin [facility] DM) 30-600 MG 12hr tablet Take 1 tablet by mouth 2 (two) times daily as needed (cough/congestion.).     [provider]  furosemide (LASIX) 40 MG tablet Take 40 mg by mouth 2 (two) times a week. Saturdays & Sundays ONLY    [provider]   Current Facility-Administered Medications  Medication Dose Route Frequency Provider Last Rate Last Dose  . [START ON 04/19/2019] 0.9 %  sodium chloride infusion   Intravenous Continuous Lauree Chandler D, MD      . 0.9 %  sodium chloride infusion   Intravenous Continuous Albertha Ghee, MD 10 mL/hr at 04/18/19 0930    . 0.9 %  sodium chloride infusion   Intravenous Continuous Eileen Stanford, PA-C      . chlorhexidine (HIBICLENS) 4 % liquid 2 application  30 mL  Topical UD McAlhany, Annita Brod, MD      . chlorhexidine (HIBICLENS) 4 % liquid 4 application  60 mL Topical Once Burnell Blanks, MD       And  . Derrill Memo ON 04/19/2019] chlorhexidine (HIBICLENS) 4 % liquid 4 application  60 mL Topical Once Burnell Blanks, MD      . dexmedetomidine (PRECEDEX) 400 MCG/100ML (4 mcg/mL) infusion  0.1-0.7 mcg/kg/hr Intravenous To OR Burnell Blanks, MD      . fentaNYL (SUBLIMAZE) injection 25-50 mcg  25-50 mcg Intravenous Q5 min PRN Hodierne, Adam, MD      . heparin 30,000 units/NS 1000 mL solution for CELLSAVER   Other To OR Burnell Blanks, MD      . magnesium sulfate (IV Push/IM) injection 40 mEq  40 mEq Other To OR Burnell Blanks, MD      . morphine 10 MG/ML injection 1-4 mg  1-4 mg Intravenous Q1H PRN [name] R, PA-C   2 mg at 04/18/19 1406  . nitroGLYCERIN 50 mg in dextrose 5 % 250 mL (0.2 mg/mL) infusion  0-100 mcg/min Intravenous Titrated [name] R, PA-C      . ondansetron (ZOFRAN) injection 4 mg  4 mg Intravenous Q6H PRN Hodierne, Adam, MD      . oxyCODONE (Oxy IR/ROXICODONE) immediate release tablet 5 mg  5 mg Oral Once PRN Albertha Ghee, MD       Or  . oxyCODONE (ROXICODONE) 5 MG/5ML solution 5 mg  5 mg Oral Once PRN Albertha Ghee, MD      . phenylephrine (NEOSYNEPHRINE) 20-0.9 MG/250ML-% infusion  0-100 mcg/min Intravenous Titrated Eileen Stanford, PA-C      . potassium chloride injection 80 mEq  80 mEq Other To OR Burnell Blanks, MD      . traMADol Veatrice Bourbon) tablet 50-100 mg  50-100 mg Oral Q4H PRN Eileen Stanford, PA-C      . verapamil (ISOPTIN) injection 5 mg  5 mg Intravenous Once Burnell Blanks, MD       Labs: Basic Metabolic Panel: Recent Labs  Lab 04/18/19 0802 04/18/19 1115 04/18/19 1247 04/18/19 1334  NA 133* 137 135 136  K 4.4 4.0 4.2 4.6  CL 97* 98 100 100  CO2 21*  --   --   --   GLUCOSE 90 83 108* 108*  BUN 36* 38* 34* 37*  CREATININE 5.78*  5.20*  5.50* 5.60*  CALCIUM 8.5*  --   --   --    Liver Function Tests: Recent Labs  Lab 04/18/19 0802  AST 19  ALT 15  ALKPHOS 67  BILITOT 0.6  PROT 6.6  ALBUMIN 3.5   CBC: Recent Labs  Lab 04/18/19 0802 04/18/19 1115 04/18/19 1247 04/18/19 1334  WBC 9.9  --   --   --   HGB 13.2 13.3 11.2* 13.3  HCT 42.0 39.0 33.0* 39.0  MCV 102.9*  --   --   --   PLT 183  --   --   --     ROS: All others negative except those listed in HPI.  Physical Exam: Vitals:   04/18/19 1520 04/18/19 1525 04/18/19 1530 04/18/19 1552  BP: (!) 104/28 (!) 98/29 (!) 116/47 (!) 126/48  Pulse: 84 72 81 79  Resp: 18 17 16 16   Temp:      TempSrc:      SpO2: 98% 99% 98% 100%  Weight:      Height:         General: WDWN, NAD, obese male Head: NCAT sclera not icteric MMM Neck: Supple. No lymphadenopathy. No JVD Lungs: CTA bilaterally. No wheeze, rales or rhonchi. Breathing is unlabored. Heart: RRR.  Abdomen: soft, nontender, +BS, no guarding, no rebound tenderness Lower extremities:trace edema on R, no edema on L, no ischemic changes, or open wounds  Neuro: AAOx3. Moves all extremities spontaneously. Psych:  Responds to questions appropriately with a normal affect. Dialysis Access:RU AVF +b  Dialysis Orders:  MWF at [facility] only open on MWF - will call tomorrow for orders)  Assessment/Plan: 1.  Severe AS - s/p TAVR - per CTS 2.  ESRD -  On HD MWF.  Orders written for HD tomorrow per regular schedule. K 4.6, use 2K bath.  No heparin post surgery. 3.  Hypertension/volume  - BP variable.  Does not appear volume overloaded.  Plan for minimal UF tomorrow.  4.  Anemia of CKD - Hgb 13.3. No indication for ESA at this time.  5.  Secondary Hyperparathyroidism - Calcium at goal.  Will check phos.  Call [facility] tomorrow to find out about VDRA.  6.  Nutrition - Renal diet w/fluid restrictions once advanced.  7. DM - per primary 8. COPD - per primary  Jen Mow, PA-C [facility] Pager: [phone] 04/18/2019, 4:09 PM   Pt seen, examined and agree w A/P as above.  Kelly Splinter  MD 04/18/2019, 4:44 PM

## 2019-04-18 NOTE — Progress Notes (Signed)
Pt arrived to 4e from cath lab. Pt oriented to room and staff. Vitals obtained and stable. Telemetry box applied and CCMD notified x2 verifiers. CHG bath done. Pt denies needs at this time. Wife at bedside.

## 2019-04-18 NOTE — Progress Notes (Signed)
  [place] VALVE TEAM  Patient doing well s/p TAVR. He is hemodynamically stable. Groin sites stable. ECG with atrial fibrillation no high grade block. Patient was complaining of 10/10 pain at his carotid site. Given 2 mg of morphine. Dr. Roxy Manns evaluated site and felt it was stable. It was covered with a bandage. Arterial line to be discontinued and transfer  to 4E. Plan for early ambulation after bedrest completed and hopeful discharge over the next 24-48 hours. I have called Dr. Melvia Heaps with nephrology for hemodialysis tomorrow.   [name] PA-C  [facility]  Pager [phone]

## 2019-04-18 NOTE — Progress Notes (Addendum)
Patient arrived from OR. Blood noted in corner of right eye, in mouth and pooled at back of head. Left radial sheath pulled and tr band placed with 14 in band by niesha dalton. Arm elevated.  Left Carotid incision is puffy and patient pulls away when touched.  Call to [facility] put in and Dr Ricard Dillon aware.  No A line access. One IV site.  Patient complaining of pain of 7 at carotid site

## 2019-04-18 NOTE — Progress Notes (Signed)
Dr Ricard Dillon at bedside. Dressing applied to left carotid. Site CDI with level one swelling and bruising.  Patient still complaining of severe pain. I explained to patient his Blood pressure is too low to treat him with anything else right now. He asked for saline and I told him we cannot do what dialysis does bc we would not be able to pull the fluid back off of him.  He is very upset about pain and lack of treatment.  Will continue to

## 2019-04-18 NOTE — Progress Notes (Signed)
When assessing the patient I noting swelling at IV site. Patients BP cuff above site d/t Leg not picking up accurate pressure and R arm unavailable d/t fistula.  Patient complains of pain upon palpitation of IV site.  Stat IV Team order placed.

## 2019-04-18 NOTE — Transfer of Care (Signed)
Immediate Anesthesia Transfer of Care Note  Patient: Terry [name].  Procedure(s) Performed: TRANSCATHETER AORTIC VALVE REPLACEMENT,LEFT CAROTID (Left ) TRANSESOPHAGEAL ECHOCARDIOGRAM (TEE) (N/A )  Patient Location: Cath Lab  Anesthesia Type:General  Level of Consciousness: awake, alert  and oriented  Airway & Oxygen Therapy: Patient Spontanous Breathing and Patient connected to face mask oxygen  Post-op Assessment: Report given to RN and Post -op Vital signs reviewed and stable  Post vital signs: Reviewed and stable  Last Vitals:  Vitals Value Taken Time  BP 108/39 04/18/19 1335  Temp    Pulse 85 04/18/19 1335  Resp 30 04/18/19 1335  SpO2 99 % 04/18/19 1335  Vitals shown include unvalidated device data.  Last Pain:  Vitals:   04/18/19 0837  TempSrc: Oral  PainSc: 0-No pain         Complications: No apparent anesthesia complications

## 2019-04-18 NOTE — Progress Notes (Signed)
Per patient, cell phone to be given to wife Thayer Headings out in the main waiting area. Cell phone has been delivered. Patient made aware.

## 2019-04-18 NOTE — Telephone Encounter (Signed)
New Message  Becky from [facility] is calling in to verify that patient checked in to the hospital on 03/30/19 for the TAVR [phone]). She states that she needs to verify the check in date, procedure done, and discharge date.    Jacqlyn Larsen from [facility] ext [phone] Member ID: [id number]

## 2019-04-18 NOTE — Progress Notes (Signed)
IV team and Nephrology at bedside.

## 2019-04-18 NOTE — Anesthesia Procedure Notes (Signed)
Procedure Name: Intubation Date/Time: 04/18/2019 11:11 AM Performed by: Candis Shine, CRNA Pre-anesthesia Checklist: Patient identified, Emergency Drugs available, Suction available and Patient being monitored Patient Re-evaluated:Patient Re-evaluated prior to induction Oxygen Delivery Method: Circle System Utilized Preoxygenation: Pre-oxygenation with 100% oxygen Induction Type: IV induction Ventilation: Mask ventilation without difficulty and Oral airway inserted - appropriate to patient size Laryngoscope Size: Mac and 4 Grade View: Grade I Tube type: Oral Number of attempts: 1 Airway Equipment and Method: Stylet and Oral airway Placement Confirmation: ETT inserted through vocal cords under direct vision,  positive ETCO2 and breath sounds checked- equal and bilateral Secured at: 22 cm Tube secured with: Tape Dental Injury: Teeth and Oropharynx as per pre-operative assessment

## 2019-04-18 NOTE — Interval H&P Note (Signed)
History and Physical Interval Note:  04/18/2019 1:18 PM  Terry [name].  has presented today for surgery, with the diagnosis of Severe Aortic Stenosis.  The various methods of treatment have been discussed with the patient and family. After consideration of risks, benefits and other options for treatment, the patient has consented to  Procedure(s): TRANSCATHETER AORTIC VALVE REPLACEMENT,LEFT CAROTID (Left) TRANSESOPHAGEAL ECHOCARDIOGRAM (TEE) (N/A) as a surgical intervention.  The patient's history has been reviewed, patient examined, no change in status, stable for surgery.  I have reviewed the patient's chart and labs.  Questions were answered to the patient's satisfaction.     Lauree Chandler

## 2019-04-18 NOTE — CV Procedure (Signed)
[facility]  TAVR OPERATIVE NOTE   Date of Procedure:  04/18/2019  Preoperative Diagnosis: Severe Aortic Stenosis   Postoperative Diagnosis: Same   Procedure:    Transcatheter Aortic Valve Replacement - TransCarotid Approach  Edwards Sapien 3 THV (size 26 mm, model # [id number], serial # [id number])   Co-Surgeons:  Lauree Chandler, MD and Valentina Gu. Roxy Manns, MD   Anesthesiologist:  Hodierne  Echocardiographer:  Croitoru  Pre-operative Echo Findings:  Severe aortic stenosis  Normal left ventricular systolic function  Post-operative Echo Findings:  No paravalvular leak  Normal left ventricular systolic function  BRIEF CLINICAL NOTE AND INDICATIONS FOR SURGERY  84 yo male with history of paroxysmal atrial fibrillation, HTN, HLD, prior CVA, COPD, CAD, ESRD on HD and moderate aortic stenosis here today for TAVR. He had been followed by cardiology in [place], [name] for many years until he moved to [place] in 2014. Cath records received from [facility] . Cardiac cath 11/30/1997. Mild distal Left main stenosis, 25% proximal LAD, minor irregularities Circumflex, 95% mid RCA stenosis treated with 3.5 x 32 mm bare metal stent. There was a ? Overlapping 3.5 x 18 mm bare metal stent more proximally. Echo 02/03/13 with LVEF=50-55%. He had normal carotid dopplers in [place] 2013. He was admitted to [facility] October 2015 with ruptured kidney cyst. Atrial fibrillation diagnosed in December 2015 at an office visit. He was initially started on Eliquis but progressed to ESRD so this was stopped. He was started on coumadin but had GI blood loss and severe bruising so this was stopped. He has been on low dose beta blocker therapy. He had been started on amiodarone but this was stopped. Low risk stress myoview September 2016.  He was admitted to [facility] in March 2019 with SOB. He was in atrial fib and treated for pneumonia and COPD exacerbation. Cardiac cath with  severe RCA disease (70% ostial stenosis, 95% mid stenosis, 50% LAD proximal stenosis, Circumflex 40% mid stenosis. His vessels were all calcified. Echo March 2019 with LVEF=55%, severe AS with AVA 0.7 cm2, mean gradient 21 mmHg, mild LVH. He was transferred to [facility] and had rotational atherectomy of the ostial and mid RCA and placement of a 3.0 x 28 mm drug eluting stent in the mid vessel and a 3.5 x 24 mm drug eluting stent in the ostium of the RCA. Echo at [facility] with aortic valve area of 1.0 cm2 He was seen by the TAVR team and it was felt that TAVR was not indicated at this time as his AS was only moderate. Mean gradient 25 mmHg. Echo here in September 2019 with normal LV systolic function, moderately severe AS with mean gradient 20 mmHg, AVA around 1.0 cm2 and DVI 0.21. Hospitalized in November 2019 at [facility] in [place], [name] due to pneumonia and echo there showed moderate AS with mean gradient 20 mmHg and AVA 1.1 cm2. LV function was normal. He has PAD and left leg ischemia in July 2019 treated with left femoral endarterectomy, Dr. Donnetta Hutching. Echo August 2020 with normal LV systolic function. The aortic valve leaflets are thickened and calcified. AVA 0.53cm2. Dimensionless index 0.19. Mean gradient 29 mmHg. The patient endorses worsened dyspnea. He can barely walk across the room without having to stop to catch his breath.   During the course of the patient's preoperative work up they have been evaluated comprehensively by a multidisciplinary team of specialists coordinated through the [facility] in the [facility].  They have been demonstrated to suffer from symptomatic severe aortic stenosis as noted above. The patient has been counseled extensively as to the relative risks and benefits of all options for the treatment of severe aortic stenosis including long term medical therapy, conventional surgery for aortic valve replacement, and  transcatheter aortic valve replacement.  The patient has been independently evaluated by Dr. Roxy Manns with CT surgery and they are felt to be at high risk for conventional surgical aortic valve replacement. The surgeon indicated the patient would be a poor candidate for conventional surgery. Based upon review of all of the patient's preoperative diagnostic tests they are felt to be candidate for transcatheter aortic valve replacement using the transfemoral approach as an alternative to high risk conventional surgery.    Following the decision to proceed with transcatheter aortic valve replacement, a discussion has been held regarding what types of management strategies would be attempted intraoperatively in the event of life-threatening complications, including whether or not the patient would be considered a candidate for the use of cardiopulmonary bypass and/or conversion to open sternotomy for attempted surgical intervention.  The patient has been advised of a variety of complications that might develop peculiar to this approach including but not limited to risks of death, stroke, paravalvular leak, aortic dissection or other major vascular complications, aortic annulus rupture, device embolization, cardiac rupture or perforation, acute myocardial infarction, arrhythmia, heart block or bradycardia requiring permanent pacemaker placement, congestive heart failure, respiratory failure, renal failure, pneumonia, infection, other late complications related to structural valve deterioration or migration, or other complications that might ultimately cause a temporary or permanent loss of functional independence or other long term morbidity.  The patient provides full informed consent for the procedure as described and all questions were answered preoperatively.    DETAILS OF THE OPERATIVE PROCEDURE  PREPARATION:   The patient is brought to the operating room on the above mentioned date. Due to poor access options  given his right AV fistula, we elected to place the radial sheath in the left radial artery. This was done prior to induction of anesthesia in order to be able to monitor his arterial pressure. The patient is placed in the supine position on the operating table.  Intravenous antibiotics are administered. General anesthesia is used.   Baseline transesophageal echocardiogram was performed. The patient's chest, abdomen, both groins, and both lower extremities are prepared and draped in a sterile manner. A time out procedure is performed.   PERIPHERAL ACCESS:   Using the modified Seldinger technique, venous access was obtained with placement of 6 Fr sheath in the right femoral vein. A pigtail diagnostic catheter was passed through the left radial artery sheath. A temporary transvenous pacemaker catheter was passed through the femoral venous sheath under fluoroscopic guidance into the right ventricle.  The pacemaker was tested to ensure stable lead placement and pacemaker capture. Aortic root angiography was performed in order to determine the optimal angiographic angle for valve deployment.  TRANSCAROTID ACCESS: Please see Dr. Guy Sandifer note for details.   The patient was heparinized systemically and ACT verified > 250 seconds.    Access obtained into the left carotid artery with a needle followed by a J wire. A 8 French sheath was advanced over the wire.l An AL-2 catheter was directed into the left ventricle. The J wire was then used to change out for a pigtail catheter. An Amplatz Extra Stiff wire was then passed through the pigtail into the left ventricle. A 18  Fr Edwards sheath was introduced into the left carotid artery over this wire down into the ascending aorta.   TRANSCATHETER HEART VALVE DEPLOYMENT:  An Edwards Sapien 3 THV (size 26 mm) was prepared and crimped per manufacturer's guidelines, and the proper orientation of the valve is confirmed on the [employer] delivery system. The valve was  advanced through the introducer sheath into the ascending aorta. The valve was carefully positioned across the aortic valve annulus. Once final position of the valve has been confirmed by angiographic assessment, the valve is deployed while temporarily holding ventilation and during rapid ventricular pacing to maintain systolic blood pressure < 50 mmHg and pulse pressure < 10 mmHg. The balloon inflation is held for >3 seconds after reaching full deployment volume. Once the balloon has fully deflated the balloon is retracted into the ascending aorta and valve function is assessed using TEE. There is felt to be no paravalvular leak and no central aortic insufficiency.  The patient's hemodynamic recovery following valve deployment is good.  The deployment balloon and guidewire are both removed. Echo demostrated acceptable post-procedural gradients, stable mitral valve function, and no AI.   PROCEDURE COMPLETION:  The sheath was then removed and the surgical wound was closed by Dr. Roxy Manns. Protamine was administered once carotid arterial repair was complete.   The patient tolerated the procedure well and is transported to the surgical intensive care in stable condition. There were no immediate intraoperative complications. All sponge instrument and needle counts are verified correct at completion of the operation.   No blood products were administered during the operation.  The patient received a total of 40 mL of intravenous contrast during the procedure.  Lauree Chandler MD 04/18/2019 10:47 AM

## 2019-04-19 ENCOUNTER — Encounter (HOSPITAL_COMMUNITY): Payer: Self-pay | Admitting: Cardiovascular Disease

## 2019-04-19 ENCOUNTER — Inpatient Hospital Stay (HOSPITAL_COMMUNITY): Payer: Medicare PPO

## 2019-04-19 DIAGNOSIS — Z952 Presence of prosthetic heart valve: Secondary | ICD-10-CM

## 2019-04-19 LAB — BASIC METABOLIC PANEL
Anion gap: 14 (ref 5–15)
BUN: 51 mg/dL — ABNORMAL HIGH (ref 8–23)
CO2: 24 mmol/L (ref 22–32)
Calcium: 8.1 mg/dL — ABNORMAL LOW (ref 8.9–10.3)
Chloride: 99 mmol/L (ref 98–111)
Creatinine, Ser: 7.14 mg/dL — ABNORMAL HIGH (ref 0.61–1.24)
GFR calc Af Amer: 8 mL/min — ABNORMAL LOW (ref >60)
GFR calc non Af Amer: 7 mL/min — ABNORMAL LOW (ref >60)
Glucose, Bld: 122 mg/dL — ABNORMAL HIGH (ref 70–99)
Potassium: 5.3 mmol/L — ABNORMAL HIGH (ref 3.5–5.1)
Sodium: 137 mmol/L (ref 135–145)

## 2019-04-19 LAB — CBC
HCT: 37.2 % — ABNORMAL LOW (ref 39.0–52.0)
Hemoglobin: 11.3 g/dL — ABNORMAL LOW (ref 13.0–17.0)
MCH: 31.8 pg (ref 26.0–34.0)
MCHC: 30.4 g/dL (ref 30.0–36.0)
MCV: 104.8 fL — ABNORMAL HIGH (ref 80.0–100.0)
Platelets: 158 10*3/uL (ref 150–400)
RBC: 3.55 MIL/uL — ABNORMAL LOW (ref 4.22–5.81)
RDW: 18.9 % — ABNORMAL HIGH (ref 11.5–15.5)
WBC: 9.8 10*3/uL (ref 4.0–10.5)
nRBC: 0 % (ref 0.0–0.2)

## 2019-04-19 LAB — MAGNESIUM: Magnesium: 2.2 mg/dL (ref 1.7–2.4)

## 2019-04-19 LAB — ECHOCARDIOGRAM LIMITED
Height: 68 in
Weight: 2571.45 oz

## 2019-04-19 MED ORDER — MIDODRINE HCL 5 MG PO TABS
ORAL_TABLET | ORAL | Status: AC
  Filled 2019-04-19: qty 2

## 2019-04-19 MED ORDER — LIDOCAINE-PRILOCAINE 2.5-2.5 % EX CREA: 1.0000 "application " | TOPICAL_CREAM | CUTANEOUS | Status: DC | PRN

## 2019-04-19 MED ORDER — METOPROLOL SUCCINATE ER 25 MG PO TB24
25.0000 mg | ORAL_TABLET | ORAL | Status: DC
  Administered 2019-04-19: 25 mg via ORAL
  Filled 2019-04-19 (×2): qty 1

## 2019-04-19 MED ORDER — ALTEPLASE 2 MG IJ SOLR: 2.0000 mg | Freq: Once | INTRAMUSCULAR | Status: DC | PRN

## 2019-04-19 MED ORDER — HEPARIN SODIUM (PORCINE) 1000 UNIT/ML DIALYSIS: 1000.0000 [IU] | INTRAMUSCULAR | Status: DC | PRN

## 2019-04-19 MED ORDER — SODIUM CHLORIDE 0.9 % IV SOLN: 100.0000 mL | INTRAVENOUS | Status: DC | PRN

## 2019-04-19 MED ORDER — METOPROLOL SUCCINATE ER 50 MG PO TB24
50.0000 mg | ORAL_TABLET | ORAL | Status: DC
  Administered 2019-04-20: 50 mg via ORAL
  Filled 2019-04-19: qty 2
  Filled 2019-04-19: qty 1

## 2019-04-19 MED ORDER — LIDOCAINE HCL (PF) 1 % IJ SOLN: 5.0000 mL | INTRAMUSCULAR | Status: DC | PRN

## 2019-04-19 MED ORDER — PENTAFLUOROPROP-TETRAFLUOROETH EX AERO: 1.0000 "application " | INHALATION_SPRAY | CUTANEOUS | Status: DC | PRN

## 2019-04-19 NOTE — Discharge Instructions (Signed)
ACTIVITY AND EXERCISE °• Daily activity and exercise are an important part of your recovery. People recover at different rates depending on their general health and type of valve procedure. °• Most people recovering from TAVR feel better relatively quickly  °• No lifting, pushing, pulling more than 10 pounds (examples to avoid: groceries, vacuuming, gardening, golfing): °            - For one week with a procedure through the groin. °            - For six weeks for procedures through the chest wall or neck. °NOTE: You will typically see one of our providers 7-14 days after your procedure to discuss WHEN TO RESUME the above activities.  °  °  °DRIVING °• Do not drive until you are seen for follow up and cleared by a provider. Generally, we ask patient to not drive for 1 week after their procedure. °• If you have been told by your doctor in the past that you may not drive, you must talk with him/her before you begin driving again. °  °DRESSING °• Groin site: you may leave the clear dressing over the site for up to one week or until it falls off. °  °HYGIENE °• If you had a femoral (leg) procedure, you may take a shower when you return home. After the shower, pat the site dry. Do NOT use powder, oils or lotions in your groin area until the site has completely healed. °• If you had a chest procedure, you may shower when you return home unless specifically instructed not to by your discharging practitioner. °            - DO NOT scrub incision; pat dry with a towel. °            - DO NOT apply any lotions, oils, powders to the incision. °            - No tub baths / swimming for at least 2 weeks. °• If you notice any fevers, chills, increased pain, swelling, bleeding or pus, please contact your doctor. °  °ADDITIONAL INFORMATION °• If you are going to have an upcoming dental procedure, please contact our office as you will require antibiotics ahead of time to prevent infection on your heart valve.  ° ° °If you have any  questions or concerns you can call the structural heart phone during normal business hours 8am-4pm. If you have an urgent need after hours or weekends please call [phone] to talk to the on call provider for general cardiology. If you have an emergency that requires immediate attention, please call 911.  ° ° °After TAVR Checklist ° °Check  Test Description  ° Follow up appointment in 1-2 weeks  You will see our structural heart physician assistant, Katie Jennie Bolar. Your incision sites will be checked and you will be cleared to drive and resume all normal activities if you are doing well.    ° 1 month echo and follow up  You will have an echo to check on your new heart valve and be seen back in the office by Katie Cammeron Greis. Many times the echo is not read by your appointment time, but Katie will call you later that day or the following day to report your results.  ° Follow up with your primary cardiologist You will need to be seen by your primary cardiologist in the following 3-6 months after your 1 month appointment in the valve   clinic. Often times your Plavix or Aspirin will be discontinued during this time, but this is decided on a case by case basis.   ° 1 year echo and follow up You will have another echo to check on your heart valve after 1 year and be seen back in the office by Katie Diamante Rubin. This your last structural heart visit.  ° Bacterial endocarditis prophylaxis  You will have to take antibiotics for the rest of your life before all dental procedures (even teeth cleanings) to protect your heart valve. Antibiotics are also required before some surgeries. Please check with your cardiologist before scheduling any surgeries. Also, please make sure to tell us if you have a penicillin allergy as you will require an alternative antibiotic.   ° ° °

## 2019-04-19 NOTE — Anesthesia Postprocedure Evaluation (Signed)
Anesthesia Post Note  Patient: Terry [name].  Procedure(s) Performed: TRANSCATHETER AORTIC VALVE REPLACEMENT,LEFT CAROTID (Left ) TRANSESOPHAGEAL ECHOCARDIOGRAM (TEE) (N/A )     Patient location during evaluation: Cath Lab Anesthesia Type: General Level of consciousness: awake and alert Pain management: pain level controlled Vital Signs Assessment: post-procedure vital signs reviewed and stable Respiratory status: spontaneous breathing, nonlabored ventilation, respiratory function stable and patient connected to nasal cannula oxygen Cardiovascular status: blood pressure returned to baseline and stable Postop Assessment: no apparent nausea or vomiting Anesthetic complications: no    Last Vitals:  Vitals:   04/19/19 1000 04/19/19 1030  BP: (!) 121/49 (!) 128/45  Pulse: 81 77  Resp:    Temp:    SpO2:      Last Pain:  Vitals:   04/19/19 0745  TempSrc: Oral  PainSc: 0-No pain                 Lonni Dirden S

## 2019-04-19 NOTE — Progress Notes (Signed)
CARDIAC REHAB PHASE I   Went to offer to walk with pt. Pt asleep in the bed. Wife at bedside stating pt has had a busy day and didn't sleep much last night. Wife requesting we let pt rest. Will follow-up tomorrow with pt and wife.  [phone] Rufina Falco, RN BSN 04/19/2019 2:42 PM

## 2019-04-19 NOTE — Progress Notes (Addendum)
[facility] VALVE TEAM  Patient Name: Terry [name]. Date of Encounter: 04/19/2019  Primary Cardiologist: Dr. [name] / Dr. [name] & Dr. Roxy Manns (TAVR)  Hospital Problem List     Principal Problem:   S/P TAVR (transcatheter aortic valve replacement) Active Problems:   Essential hypertension   CAD (coronary artery disease)   Chronic atrial fibrillation (HCC)   Acute on chronic diastolic heart failure (HCC)   Anemia of chronic disease   ESRD (end stage renal disease) on dialysis (HCC)   COPD (chronic obstructive pulmonary disease) GOLD stage III   Chronic hypoxemic respiratory failure (HCC)   PAD (peripheral artery disease) (HCC)   Severe aortic stenosis   History of GI bleed     Subjective   Currently getting hemodialysis. No complaints this AM. Wants to go home later.   Inpatient Medications    Scheduled Meds: . aspirin EC  81 mg Oral Daily  . atorvastatin  80 mg Oral QHS  . calcium acetate  1,334 mg Oral TID WC  . calcium acetate  667 mg Oral With snacks  . Chlorhexidine Gluconate Cloth  6 each Topical Q0600  . clopidogrel  75 mg Oral Daily  . loratadine  10 mg Oral Daily  . [START ON 04/20/2019] metoprolol succinate  50 mg Oral Q T,Th,S,Su  . midodrine  10 mg Oral Q M,W,F-HD  . mometasone-formoterol  2 puff Inhalation BID  . pantoprazole  40 mg Oral BID AC  . predniSONE  10 mg Oral Q breakfast  . sodium chloride flush  3 mL Intravenous Q12H   Continuous Infusions: . sodium chloride    . sodium chloride    . sodium chloride    . cefUROXime (ZINACEF)  IV    . nitroGLYCERIN     PRN Meds: sodium chloride, sodium chloride, sodium chloride, acetaminophen **OR** acetaminophen, albuterol, alteplase, heparin, lidocaine (PF), lidocaine-prilocaine, morphine injection, ondansetron (ZOFRAN) IV, oxyCODONE, pentafluoroprop-tetrafluoroeth, sodium chloride flush, traMADol   Vital Signs    Vitals:   04/19/19 0815 04/19/19 0830  04/19/19 0900 04/19/19 0930  BP: (!) 120/49 (!) 111/56 (!) 111/48 (!) (P) 107/56  Pulse: 86 86 75 (P) 84  Resp:      Temp:      TempSrc:      SpO2:      Weight:      Height:        Intake/Output Summary (Last 24 hours) at 04/19/2019 1000 Last data filed at 04/19/2019 0500 Gross per 24 hour  Intake 1842.75 ml  Output 70 ml  Net 1772.75 ml   Filed Weights   04/18/19 0837 04/19/19 0500 04/19/19 0745  Weight: 70.3 kg 72.5 kg 72.9 kg    Physical Exam   GEN: chronically ill appearing. Currently getting HD.  HEENT: Grossly normal.  Neck: Supple, no JVD, carotid bruits, or masses. Cardiac: RRR, no murmurs but difficult to hear hear sounds over HD machine, TV and rhonchi when breathing. No rubs, or gallops. No clubbing, cyanosis. 1+ right pitting edema.   Respiratory: rhonchorus expiratory breath sounds GI: Soft, nontender, nondistended, BS + x 4. MS: no deformity or atrophy. Skin: warm and dry, no rash. Diffuse ecchymosis over arms and hands and at left carotid site. No hematoma. Healing well.  Neuro:  Strength and sensation are intact. Psych: AAOx3.  Normal affect.  Labs    CBC Recent Labs    04/18/19 0802  04/18/19 1334 04/19/19 0333  WBC 9.9  --   --  9.8  HGB 13.2   < > 13.3 11.3*  HCT 42.0   < > 39.0 37.2*  MCV 102.9*  --   --  104.8*  PLT 183  --   --  158   < > = values in this interval not displayed.   Basic Metabolic Panel Recent Labs    04/18/19 0802  04/18/19 1334 04/19/19 0333  NA 133*   < > 136 137  K 4.4   < > 4.6 5.3*  CL 97*   < > 100 99  CO2 21*  --   --  24  GLUCOSE 90   < > 108* 122*  BUN 36*   < > 37* 51*  CREATININE 5.78*   < > 5.60* 7.14*  CALCIUM 8.5*  --   --  8.1*  MG  --   --   --  2.2   < > = values in this interval not displayed.   Liver Function Tests Recent Labs    04/18/19 0802  AST 19  ALT 15  ALKPHOS 67  BILITOT 0.6  PROT 6.6  ALBUMIN 3.5   No results for input(s): LIPASE, AMYLASE in the last 72 hours. Cardiac  Enzymes No results for input(s): CKTOTAL, CKMB, CKMBINDEX, TROPONINI in the last 72 hours. BNP Invalid input(s): POCBNP D-Dimer No results for input(s): DDIMER in the last 72 hours. Hemoglobin A1C Recent Labs    04/18/19 0803  HGBA1C 5.7*   Fasting Lipid Panel No results for input(s): CHOL, HDL, LDLCALC, TRIG, CHOLHDL, LDLDIRECT in the last 72 hours. Thyroid Function Tests No results for input(s): TSH, T4TOTAL, T3FREE, THYROIDAB in the last 72 hours.  Invalid input(s): FREET3  Telemetry    afib - Personally Reviewed  ECG    afib with CVR HR 79 - Personally Reviewed  Radiology    Dg Chest Port 1 View  Result Date: 04/18/2019 CLINICAL DATA:  Status post total aortic valve replacement. EXAM: PORTABLE CHEST 1 VIEW COMPARISON:  March 30, 2019 FINDINGS: The mediastinal contour is normal. The heart size is normal. TAVR is identified. There is no focal infiltrate, pulmonary edema, or pleural effusion. No acute abnormalities identified in the visualized bony structures. IMPRESSION: Aortic valvular replacement identified.  No acute abnormality. Electronically Signed   By: Abelardo Diesel M.D.   On: 04/18/2019 17:25    Cardiac Studies   TAVR OPERATIVE NOTE   Date of Procedure:                04/18/2019  Preoperative Diagnosis:      Severe Aortic Stenosis   Postoperative Diagnosis:    Same   Procedure:        Transcatheter Aortic Valve Replacement - Open Left Transcarotid Approach             Edwards Sapien 3 THV (size 26 mm, model # 9600TFX, serial # [id number])              Co-Surgeons:                        Valentina Gu. Roxy Manns, MD and Lauree Chandler, MD  Anesthesiologist:                  Albertha Ghee, MD  Echocardiographer:              Sanda Klein, MD  Pre-operative Echo Findings: ? Severe aortic stenosis ? Normal left ventricular systolic function  Post-operative Echo Findings: ? No  paravalvular leak ? Normal left ventricular systolic function   _________________   Echo 04/19/19: pending   Patient Profile     Terry [name]. is a 84 y.o. male with a history of persistent atrial fibrillation (not on [place] given history of GI bleeding), HTN , HLD, prior CVA, PAD s/p L endarterectomy, COPD, CAD s/p multiple PCIs (recent POBA for ISR on 9/1), ESRD on HD (M,W, F), iatrogenic pseudoaneurysm s/p US guided compression, and severe paradoxical LFLG aortic stenosis who presented to [facility] on 04/18/19 for planned TAVR.   Assessment & Plan    Severe AS: s/p successful TAVR with a 26 mm Edwards Sapien 3 THV via the left carotid approach on 04/18/19. Post operative echo pending. Carotid site with ecchymosis but no hematoma. Radial site healing well. ECG with chronic atrial fibrillation and no high grade heart block. He has been continued on his chronic Asprin and Plavix. Plan to keep him overnight tonight with potential discharge home tomorrow.   ESRD: getting HD now. He will return to [facility] to resume his normal HD schedule. Hyperkalemia to be treated with HD. Appreciate nephrology assistance.    HTN: BP under good control. Will resume home Toprol XL 50 mg daily ( on Sun, Tue, Thur) and 25mg  daily (M, W, F, Sat).   Acute on chronic diastolic CHF: as evidenced by an elevated BNP on pre admission lab work. This has been treated with TAVR. He will resume home lasix on Sat and Sunday. HD for volume management.   COPD: stable. Continue on home regimen and chronic steroids.   CAD: recent POBA for ISR on 9/1. Continue on chronic aspirin and plavix, BB and statin.    Signed, [name], PA-C  04/19/2019, 10:00 AM  Pager [phone]   I have seen and examined the patient and agree with the assessment and plan as outlined.  Rexene Alberts, MD 04/19/2019

## 2019-04-19 NOTE — Progress Notes (Signed)
  Echocardiogram 2D Echocardiogram has been performed.  Jennette Dubin 04/19/2019, 2:28 PM

## 2019-04-19 NOTE — Progress Notes (Signed)
[facility] Progress Note  Subjective: seen on HD, no new c/o  Vitals:   04/19/19 1000 04/19/19 1030 04/19/19 1100 04/19/19 1131  BP: (!) 121/49 (!) 128/45 (!) 140/51 (!) 141/53  Pulse: 81 77 71 70  Resp:      Temp:      TempSrc:      SpO2:      Weight:      Height:        Inpatient medications: . aspirin EC  81 mg Oral Daily  . atorvastatin  80 mg Oral QHS  . calcium acetate  1,334 mg Oral TID WC  . calcium acetate  667 mg Oral With snacks  . Chlorhexidine Gluconate Cloth  6 each Topical Q0600  . clopidogrel  75 mg Oral Daily  . loratadine  10 mg Oral Daily  . metoprolol succinate  25 mg Oral Q M,W,F-1800  . [START ON 04/20/2019] metoprolol succinate  50 mg Oral Q T,Th,S,Su  . midodrine  10 mg Oral Q M,W,F-HD  . mometasone-formoterol  2 puff Inhalation BID  . pantoprazole  40 mg Oral BID AC  . predniSONE  10 mg Oral Q breakfast  . sodium chloride flush  3 mL Intravenous Q12H   . sodium chloride    . cefUROXime (ZINACEF)  IV    . nitroGLYCERIN     sodium chloride, acetaminophen **OR** acetaminophen, albuterol, morphine injection, ondansetron (ZOFRAN) IV, oxyCODONE, sodium chloride flush, traMADol    Exam:  alert, no distress, on HD  no jvd  Chest cta bilat  Core reg no RG   Abd soft ntnd no rebound   Ext trace pretib edema, no wounds   Ext RUA AVF+b   Neuro alert and Ox 3    Dialysis: MWF [facility]   Assessment/ Plan: 1.  Severe AS - s/p TAVR 10/27 - per CTS 2.  ESRD -  On HD MWF.  HD today.  No heparin post surgery. 3.  Hypertension/volume  - BP variable.  Does not appear volume overloaded.  Minimal UF today 4.  Anemia of CKD - Hgb 13.3. No indication for ESA at this time.  5.  Secondary Hyperparathyroidism - Calcium at goal.  6.  Nutrition - Renal diet w/fluid restrictions once advanced.  7. DM - per primary 8. COPD - per primary     Rob Bren Steers 04/19/2019, 2:30 PM  Iron/TIBC/Ferritin/ %Sat    Component Value Date/Time    IRON 39 (L) 03/15/2017 1223   TIBC 230 (L) 03/15/2017 1223   FERRITIN 1,075 (H) 03/15/2017 1223   IRONPCTSAT 17 (L) 03/15/2017 1223   Recent Labs  Lab 04/18/19 0802  04/19/19 0333  NA 133*   < > 137  K 4.4   < > 5.3*  CL 97*   < > 99  CO2 21*  --  24  GLUCOSE 90   < > 122*  BUN 36*   < > 51*  CREATININE 5.78*   < > 7.14*  CALCIUM 8.5*  --  8.1*  ALBUMIN 3.5  --   --   INR 1.0  --   --    < > = values in this interval not displayed.   Recent Labs  Lab 04/18/19 0802  AST 19  ALT 15  ALKPHOS 67  BILITOT 0.6  PROT 6.6   Recent Labs  Lab 04/19/19 0333  WBC 9.8  HGB 11.3*  HCT 37.2*  PLT 158

## 2019-04-20 ENCOUNTER — Telehealth: Payer: Self-pay | Admitting: Cardiovascular Disease

## 2019-04-20 ENCOUNTER — Other Ambulatory Visit: Payer: Self-pay | Admitting: Physician Assistant

## 2019-04-20 LAB — BASIC METABOLIC PANEL
Anion gap: 10 (ref 5–15)
BUN: 26 mg/dL — ABNORMAL HIGH (ref 8–23)
CO2: 27 mmol/L (ref 22–32)
Calcium: 8 mg/dL — ABNORMAL LOW (ref 8.9–10.3)
Chloride: 98 mmol/L (ref 98–111)
Creatinine, Ser: 4.65 mg/dL — ABNORMAL HIGH (ref 0.61–1.24)
GFR calc Af Amer: 13 mL/min — ABNORMAL LOW (ref >60)
GFR calc non Af Amer: 11 mL/min — ABNORMAL LOW (ref >60)
Glucose, Bld: 89 mg/dL (ref 70–99)
Potassium: 4.7 mmol/L (ref 3.5–5.1)
Sodium: 135 mmol/L (ref 135–145)

## 2019-04-20 LAB — CBC
HCT: 36.8 % — ABNORMAL LOW (ref 39.0–52.0)
Hemoglobin: 11.2 g/dL — ABNORMAL LOW (ref 13.0–17.0)
MCH: 32.4 pg (ref 26.0–34.0)
MCHC: 30.4 g/dL (ref 30.0–36.0)
MCV: 106.4 fL — ABNORMAL HIGH (ref 80.0–100.0)
Platelets: 149 10*3/uL — ABNORMAL LOW (ref 150–400)
RBC: 3.46 MIL/uL — ABNORMAL LOW (ref 4.22–5.81)
RDW: 19.1 % — ABNORMAL HIGH (ref 11.5–15.5)
WBC: 9.4 10*3/uL (ref 4.0–10.5)
nRBC: 0 % (ref 0.0–0.2)

## 2019-04-20 MED ORDER — ASPIRIN EC 81 MG PO TBEC: 81.0000 mg | DELAYED_RELEASE_TABLET | Freq: Every day | ORAL | Status: DC

## 2019-04-20 MED ORDER — OXYCODONE-ACETAMINOPHEN 5-325 MG PO TABS: 1.0000 | ORAL_TABLET | Freq: Four times a day (QID) | ORAL | 0 refills | Status: AC | PRN

## 2019-04-20 MED FILL — Potassium Chloride Inj 2 mEq/ML: INTRAVENOUS | Qty: 40 | Status: AC

## 2019-04-20 MED FILL — Heparin Sodium (Porcine) Inj 1000 Unit/ML: INTRAMUSCULAR | Qty: 30 | Status: AC

## 2019-04-20 MED FILL — Magnesium Sulfate Inj 50%: INTRAMUSCULAR | Qty: 10 | Status: AC

## 2019-04-20 NOTE — Progress Notes (Signed)
CARDIAC REHAB PHASE I   Offered to walk with pt. Pt declining stating he walked this morning. Pt educated on site care and restrictions. Encouraged ambulation as able with emphasis on safety. Pt anxious for d/c.  [id number] Rufina Falco, RN BSN 04/20/2019 9:45 AM

## 2019-04-20 NOTE — Progress Notes (Signed)
[facility] Progress Note  Subjective: seen in room, going home today  Vitals:   04/20/19 0500 04/20/19 0535 04/20/19 0804 04/20/19 0828  BP:  (!) 129/57 (!) 149/46   Pulse:  (!) 52 84   Resp:  16 19   Temp:  97.6 F (36.4 C) 98.1 F (36.7 C)   TempSrc:  Oral Oral   SpO2:  93% (!) 88% 92%  Weight: 71.8 kg     Height:        Inpatient medications: . aspirin EC  81 mg Oral Daily  . atorvastatin  80 mg Oral QHS  . calcium acetate  1,334 mg Oral TID WC  . calcium acetate  667 mg Oral With snacks  . Chlorhexidine Gluconate Cloth  6 each Topical Q0600  . clopidogrel  75 mg Oral Daily  . loratadine  10 mg Oral Daily  . metoprolol succinate  25 mg Oral Q M,W,F-1800  . metoprolol succinate  50 mg Oral Q T,Th,S,Su  . midodrine  10 mg Oral Q M,W,F-HD  . mometasone-formoterol  2 puff Inhalation BID  . pantoprazole  40 mg Oral BID AC  . predniSONE  10 mg Oral Q breakfast  . sodium chloride flush  3 mL Intravenous Q12H   . sodium chloride    . nitroGLYCERIN     sodium chloride, acetaminophen **OR** acetaminophen, albuterol, morphine injection, ondansetron (ZOFRAN) IV, oxyCODONE, sodium chloride flush, traMADol    Exam:  alert, no distress, on HD  no jvd  Chest cta bilat  Core reg no RG   Abd soft ntnd no rebound   Ext trace pretib edema, no wounds   Ext RUA AVF+b   Neuro alert and Ox 3    Dialysis: MWF [facility]   Assessment/ Plan: 1.  Severe AS - s/p TAVR 10/27 2.  ESRD -  On HD MWF.  HD today.  No heparin post surgery. 3.  Hypertension/volume  - BP variable.  No vol excess on exam.  4.  Anemia of CKD - Hgb 13.3. No indication for ESA at this time.  5.  Secondary Hyperparathyroidism - Calcium at goal.  6.  Nutrition - Renal diet w/fluid restrictions once advanced.  7. DM - per primary 8. COPD - per primary 9. Dispo - for dc today     Rob Saphia Vanderford 04/20/2019, 12:12 PM  Iron/TIBC/Ferritin/ %Sat    Component Value Date/Time   IRON 39  (L) 03/15/2017 1223   TIBC 230 (L) 03/15/2017 1223   FERRITIN 1,075 (H) 03/15/2017 1223   IRONPCTSAT 17 (L) 03/15/2017 1223   Recent Labs  Lab 04/18/19 0802  04/20/19 0341  NA 133*   < > 135  K 4.4   < > 4.7  CL 97*   < > 98  CO2 21*   < > 27  GLUCOSE 90   < > 89  BUN 36*   < > 26*  CREATININE 5.78*   < > 4.65*  CALCIUM 8.5*   < > 8.0*  ALBUMIN 3.5  --   --   INR 1.0  --   --    < > = values in this interval not displayed.   Recent Labs  Lab 04/18/19 0802  AST 19  ALT 15  ALKPHOS 67  BILITOT 0.6  PROT 6.6   Recent Labs  Lab 04/20/19 0341  WBC 9.4  HGB 11.2*  HCT 36.8*  PLT 149*

## 2019-04-20 NOTE — Discharge Summary (Addendum)
[facility] TE[facility]  Discharge Summary    Patient ID: Terry [name]. MRN: [id number]; DOB: 1934-10-16  Admit date: 04/18/2019 Discharge date: 04/20/2019  Primary Care Provider: [name], DO  Primary Cardiologist: Terry Chandler, MD / Dr. Buena [name] & Dr. Roxy [name] (TAVR)  Discharge Diagnoses    Principal Problem:   S/P TAVR (transcatheter aortic valve replacement) Active Problems:   Essential hypertension   CAD (coronary artery disease)   Chronic atrial fibrillation (HCC)   Acute on chronic diastolic heart failure (HCC)   Anemia of chronic disease   ESRD (end stage renal disease) on dialysis (HCC)   COPD (chronic obstructive pulmonary disease) GOLD stage III   Chronic hypoxemic respiratory failure (HCC)   PAD (peripheral artery disease) (HCC)   Severe aortic stenosis   History of GI bleed   Allergies Allergies  Allergen Reactions   Yellow Dyes (Non-Tartrazine) Other (See Comments)    Burns arms; Cough medications (tussionex and benzonatate ok) specific - yellow dye used for ophthalmology procedure    Levaquin [Levofloxacin In D5w] Itching   Levofloxacin Nausea And Vomiting    Diagnostic Studies/Procedures    TAVR OPERATIVE NOTE   Date of Procedure:04/18/2019  Preoperative Diagnosis:Severe Aortic Stenosis   Postoperative Diagnosis:Same   Procedure:   Transcatheter Aortic Valve Replacement - OpenLeftTranscarotid Approach Edwards Sapien 3 THV (size 54mm, model # 9600TFX, serial # [id number])  Co-Surgeons:Terry H. Terry Manns, MD and Terry Chandler, MD  Anesthesiologist:Terry Marcie Bal, MD  Echocardiographer:Terry Croitoru, MD  Pre-operative Echo Findings: ? Severe aortic stenosis ? Normalleft ventricular systolic function  Post-operative Echo Findings: ? Noparavalvular  leak ? Normalleft ventricular systolic function  _________________   Echo 04/19/19: IMPRESSIONS  1. Left ventricular ejection fraction, by visual estimation, is 60 to 65%. The left ventricle has normal function. There is no left ventricular hypertrophy.  2. Elevated left ventricular end-diastolic pressure.  3. Left ventricular diastolic parameters are consistent with Grade II diastolic dysfunction (pseudonormalization).  4. Global right ventricle has normal systolic function.The right ventricular size is normal. No increase in right ventricular wall thickness.  5. Left atrial size was normal.  6. Right atrial size was normal.  7. The mitral valve is normal in structure. No evidence of mitral valve regurgitation. No evidence of mitral stenosis.  8. The tricuspid valve is normal in structure. Tricuspid valve regurgitation is not demonstrated.  9. Aortic valve regurgitation is not visualized. No evidence of aortic valve sclerosis or stenosis. 10. The pulmonic valve was normal in structure. Pulmonic valve regurgitation is not visualized. 11. The inferior vena cava is dilated in size with >50% respiratory variability, suggesting right atrial pressure of 8 mmHg.   Aortic Valve: The aortic valve has been repaired/replaced. Aortic valve regurgitation is not visualized. The aortic valve is structurally normal, with no evidence of sclerosis or stenosis. Aortic valve mean gradient measures 5.5 mmHg. Aortic valve peak  gradient measures 11.0 mmHg. Aortic valve area, by VTI measures 2.18 cm. 26 Edwards Sapien bioprosthetic, stented aortic valve (TAVR) valve is present in the aortic position. Procedure Date: 04/18/2019.   History of Present Illness     Terry [name]. is a 84 y.o. male with a history of persistent atrial fibrillation (not on [place] given history of GI bleeding), HTN, HLD, prior CVA, PAD s/p L endarterectomy, COPD, CAD s/p multiple PCIs (recent POBA for ISR on 9/1), ESRD on HD (M,W,  F), iatrogenic pseudoaneurysm s/p US guided compression, and severe paradoxical LFLG  aortic stenosis who presented to [facility] on 04/18/19 for planned TAVR.   Patient's cardiac history dates back more than 20 years ago when he first presented with ischemic heart disease.  He underwent PCI and stenting of the right coronary artery at that time.  He developed paroxysmal atrial fibrillation in December 2015 and was initially anticoagulated using warfarin but had trouble tolerating it.  He was switched to Eliquis but this was stopped because of progressive renal failure.  In March 2019 he was hospitalized in [place].  Echocardiogram revealed moderate to severe aortic stenosis with mean transvalvular gradient reported 21 mmHg but aortic valve area calculated only 0.7 cm.  Diagnostic cardiac catheterization performed at that time revealed moderate nonobstructive disease in the left anterior descending coronary artery and the left circumflex but high-grade stenosis in the right coronary artery.  He underwent rotational atherectomy of the ostial and mid right coronary artery with stenting using multiple drug-eluting stents at [facility] in [place].  In July 2019 he developed acute left lower extremity ischemia for which she was treated by Dr. Donnetta Hutching with femoral endarterectomy.  Later that year he was hospitalized at [facility] in [place] due to pneumonia.  Echocardiogram performed at that time was reported to demonstrate moderate aortic stenosis.  In July of this year the patient was hospitalized at [facility] with upper GI bleed while taking aspirin and Plavix and found to have a Dieulafoy lesion that was successfully cauterized.  He has not had any recurrence. The patient was recently seen in follow-up by Dr. [name].  He described stable but progressive symptoms of exertional shortness of breath.  Follow-up transthoracic echocardiogram performed February 16, 2019 revealed significant progression in the severity of aortic stenosis with preserved left ventricular systolic function.  Peak velocity across the aortic valve was reported 3.5 m/s corresponding to mean transvalvular gradient estimated 29 mmHg but aortic valve area calculated only 0.53 cm with very low DVI reported 0.19.  Ejection fraction was estimated 55 to 60%.  Patient underwent diagnostic cardiac catheterization February 21, 2019 and revealed severe single-vessel coronary artery disease including moderate nonobstructive disease in the left coronary circulation but high-grade 90% proximal and 95% mid stenosis of the right coronary artery.  Pulmonary artery pressures were mildly elevated.  Peak to peak and mean transvalvular gradients were reported 17 and 17.46 mmHg, respectively.  The patient underwent successful primary balloon angioplasty of the high-grade stenosis in the right coronary artery.   The patient has been evaluated by the multidisciplinary valve team and felt to have severe, symptomatic aortic stenosis and to be a suitable candidate for TAVR, which was set up for 04/04/19. However, there was an issue with our hybrid OR and his surgery was rescheduled to 04/18/19.    Hospital Course     Consultants: none  Severe AS:s/p successful TAVR with a 26 mm Edwards Sapien 3 THV via the left carotid approach on 04/18/19. Post operative echo showed EF 60-65%, normally functioning valve with a mean gradient of 5.5 mm Hg and no PVL. Carotid site with ecchymosis but no hematoma. Radial site healing well. ECG with chronic atrial fibrillation and no high grade heart block. He has been continued on his chronic Asprin and Plavix (but apirin changed daily). Plan for discharge home today with close follow up next week with virtual or in office visit depending on how patient is doing. I have called in a few pain pills for potential neck pain.  ESRD: had HD yesterday. Will resume home M, W, F schedule in  [place].   HTN: BP under good control. Will resume home Toprol XL 50 mg daily ( on Sun, Tue, Thur) and 25mg  daily (M, W, F, Sat).   Acute on chronic diastolic CHF: as evidenced by an elevated BNP on pre admission lab work. This has been treated with TAVR. He will resume home lasix on Sat and Sunday. HD for volume management.   COPD: stable. Continue on home regimen and chronic steroids.   CAD: recent POBA for ISR on 9/1. Continue on chronic aspirin and plavix, BB and statin.   Hx of GI bleed: admitted in 12/2018 w/ upper GI bleed while taking aspirin and Plavix and found to have a Dieulafoy lesion that was successfully cauterized. Hg has remained stable ~11-13 since that time.  _____________  Discharge Vitals Blood pressure (!) 149/46, pulse 84, temperature 98.1 F (36.7 C), temperature source Oral, resp. rate 19, height 5\' 8"  (1.727 m), weight 71.8 kg, SpO2 92 %.  Filed Weights   04/19/19 0500 04/19/19 0745 04/20/19 0500  Weight: 72.5 kg 72.9 kg 71.8 kg   ROS:   Please see the history of present illness.    ROS All other systems reviewed and are negative.   PHYSICAL EXAM:   VS:  BP (!) 149/46 (BP Location: Left Arm)    Pulse 84    Temp 98.1 F (36.7 C) (Oral)    Resp 19    Ht 5\' 8"  (1.727 m)    Wt 71.8 kg    SpO2 92%    BMI 24.05 kg/m    GEN: chronically ill appearing HEENT: normal Neck: no JVD or masses Cardiac: RRR; soft flow murmur. No rubs, or gallops. Mild LE edema  Respiratory:  clear to auscultation bilaterally, normal work of breathing GI: soft, nontender, nondistended, + BS MS: no deformity or atrophy Skin: warm and dry, no rash. Carotid site healing well with no hematoma and mild ecchymosis. Radial site healing well. Arms with chronic discoloration.  Neuro:  Alert and Oriented x 3, Strength and sensation are intact Psych: euthymic mood, full affect    Labs & Radiologic Studies    CBC Recent Labs    04/19/19 0333 04/20/19 0341  WBC 9.8 9.4  HGB 11.3*  11.2*  HCT 37.2* 36.8*  MCV 104.8* 106.4*  PLT 158 149*   Basic Metabolic Panel Recent Labs    04/19/19 0333 04/20/19 0341  NA 137 135  K 5.3* 4.7  CL 99 98  CO2 24 27  GLUCOSE 122* 89  BUN 51* 26*  CREATININE 7.14* 4.65*  CALCIUM 8.1* 8.0*  MG 2.2  --    Liver Function Tests Recent Labs    04/18/19 0802  AST 19  ALT 15  ALKPHOS 67  BILITOT 0.6  PROT 6.6  ALBUMIN 3.5   No results for input(s): LIPASE, AMYLASE in the last 72 hours. Cardiac Enzymes No results for input(s): CKTOTAL, CKMB, CKMBINDEX, TROPONINI in the last 72 hours. BNP Invalid input(s): POCBNP D-Dimer No results for input(s): DDIMER in the last 72 hours. Hemoglobin A1C Recent Labs    04/18/19 0803  HGBA1C 5.7*   Fasting Lipid Panel No results for input(s): CHOL, HDL, LDLCALC, TRIG, CHOLHDL, LDLDIRECT in the last 72 hours. Thyroid Function Tests No results for input(s): TSH, T4TOTAL, T3FREE, THYROIDAB in the last 72 hours.  Invalid input(s): FREET3 _____________  Dg Chest 2 View  Result Date: 03/31/2019 CLINICAL DATA:  Preoperative  exam EXAM: CHEST - 2 VIEW COMPARISON:  Chest radiograph dated 03/01/2018 and chest CT dated 03/14/2019. FINDINGS: The heart size is normal. Vascular calcifications are seen in the aortic arch. The lungs are hyperinflated. Both lungs are clear. Degenerative changes are seen in the spine. A vascular stent overlies the right axilla. IMPRESSION: 1. No acute cardiopulmonary findings. 2. Hyperinflation likely reflects emphysema. 3. Aortic atherosclerosis. Aortic Atherosclerosis (ICD10-I70.0) and Emphysema (ICD10-J43.9). Electronically Signed   By: Zerita Boers M.D.   On: 03/31/2019 09:21   Dg Chest Port 1 View  Result Date: 04/18/2019 CLINICAL DATA:  Status post total aortic valve replacement. EXAM: PORTABLE CHEST 1 VIEW COMPARISON:  March 30, 2019 FINDINGS: The mediastinal contour is normal. The heart size is normal. TAVR is identified. There is no focal infiltrate,  pulmonary edema, or pleural effusion. No acute abnormalities identified in the visualized bony structures. IMPRESSION: Aortic valvular replacement identified.  No acute abnormality. Electronically Signed   By: Abelardo Diesel M.D.   On: 04/18/2019 17:25   Vas [place] Lower Ext Arterial Pseudoaneurysm Compression  Result Date: 03/21/2019  ARTERIAL PSEUDOANEURYSM  Exam: Left groin Indications: Patient complains of palpable knot, bruising and groin pain. History: S/p catheterization 02/21/2019 - RIGHT/LEFT HEART CATH AND CORONARY ANGIOGRAPHY. Performing Technologist: Oliver Hum RVT  Examination Guidelines: A complete evaluation includes B-mode imaging, spectral Doppler, color Doppler, and power Doppler as needed of all accessible portions of each vessel. Bilateral testing is considered an integral part of a complete examination. Limited examinations for reoccurring indications may be performed as noted. +-----------+----------+---------+------+----------+  Left Duplex PSV (cm/s) Waveform  Plaque Comment(s)  +-----------+----------+---------+------+----------+  Ext Iliac              triphasic                    +-----------+----------+---------+------+----------+  CFA                    biphasic                     +-----------+----------+---------+------+----------+ Left Artery comments: Monophasic flow detected in the proximal superficial femoral artery, and the posterior tibial, and anterior tibial arteries. After 35 minutes of compression, flow is still detected in the neck of the pseudoaneurysm. Pseudoaneurysm sack appears to be thrombosed.  Diagnosing physician: Deitra Mayo MD Electronically signed by Deitra Mayo MD on 03/21/2019 at 4:32:25 PM.   --------------------------------------------------------------------------------    Final    Disposition   Pt is being discharged home today in good condition.  Follow-up Plans & Appointments    Follow-up Information    Eileen Stanford, PA-C.  Go on 04/27/2019.   Specialties: Cardiology, Radiology Why: @ 1pm for a virtual visit, please call us if this needs to be switched to an in office visit.  Contact information: [address] [place]  [id number] [phone]            Discharge Medications   Allergies as of 04/20/2019      Reactions   Yellow Dyes (non-tartrazine) Other (See Comments)   Burns arms; Cough medications (tussionex and benzonatate ok) specific - yellow dye used for ophthalmology procedure    Levaquin [levofloxacin In D5w] Itching   Levofloxacin Nausea And Vomiting      Medication List    TAKE these medications   acetaminophen 500 MG tablet Commonly known as: TYLENOL Take 1,000 mg by mouth every 6 (six) hours as needed for mild pain  or headache.   albuterol 108 (90 Base) MCG/ACT inhaler Commonly known as: VENTOLIN HFA Inhale 2 puffs into the lungs every 4 (four) hours as needed for wheezing or shortness of breath.   aspirin EC 81 MG tablet Take 1 tablet (81 mg total) by mouth daily. What changed:   when to take this  additional instructions   atorvastatin 80 MG tablet Commonly known as: LIPITOR Take 1 tablet by mouth once daily What changed: when to take this   calcium acetate 667 MG capsule Commonly known as: PHOSLO Take 667-1,334 mg by mouth See admin instructions. Take 2 capsules (1334 mg) by moth 3 times daily with each meal & take 1 capsule (667 mg) by mouth with each snack   cetirizine 10 MG tablet Commonly known as: ZYRTEC Take 10 mg by mouth daily as needed for allergies.   clopidogrel 75 MG tablet Commonly known as: PLAVIX Take 1 tablet by mouth once daily   dextromethorphan-guaiFENesin 30-600 MG 12hr tablet Commonly known as: MUCINEX DM Take 1 tablet by mouth 2 (two) times daily as needed (cough/congestion.).   ethyl chloride spray Apply 1 application topically every Monday, Wednesday, and Friday.   furosemide 40 MG tablet Commonly known as:  LASIX Take 40 mg by mouth 2 (two) times a week. Saturdays & Sundays ONLY Notes to patient: Saturday and Sunday only   metoprolol succinate 50 MG 24 hr tablet Commonly known as: TOPROL-XL Take 1 tablet (50 mg total) by mouth 2 (two) times daily. Take with or immediately following a meal. What changed:   how much to take  when to take this  additional instructions   midodrine 10 MG tablet Commonly known as: PROAMATINE Take 10 mg by mouth See admin instructions. Take 10 mg by mouth on Mon/Wed/Fri at dialysis as needed for low blood pressure   oxyCODONE-acetaminophen 5-325 MG tablet Commonly known as: Percocet Take 1-2 tablets by mouth every 6 (six) hours as needed for severe pain.   OXYGEN Inhale 1 L into the lungs daily as needed (for shortness of breath).   oxymetazoline 0.05 % nasal spray Commonly known as: AFRIN Place 1 spray into both nostrils once as needed (to stop nose bleeds).   pantoprazole 40 MG tablet Commonly known as: PROTONIX Take 40 mg by mouth 2 (two) times daily before a meal. Breakfast & supper   predniSONE 10 MG tablet Commonly known as: DELTASONE Take 1 tablet (10 mg total) by mouth daily with breakfast.   Symbicort 160-4.5 MCG/ACT inhaler Generic drug: budesonide-formoterol Inhale 2 puffs by mouth twice daily         Outstanding Labs/Studies  none  Duration of Discharge Encounter   Greater than 30 minutes including physician time.  Mable Fill, PA-C 04/20/2019, 11:06 AM [phone]

## 2019-04-20 NOTE — Telephone Encounter (Signed)
Ok thank you [employer]

## 2019-04-20 NOTE — Telephone Encounter (Signed)
I will call the pharmacy and clarify

## 2019-04-20 NOTE — Telephone Encounter (Signed)
  Pharmacy is calling because they need the order for oxyCODONE-acetaminophen (PERCOCET) 5-325 MG tablet to say max of 6 tablets per day to be able to fill it

## 2019-04-21 ENCOUNTER — Telehealth: Payer: Self-pay | Admitting: Physician Assistant

## 2019-04-21 DIAGNOSIS — Z992 Dependence on renal dialysis: Secondary | ICD-10-CM | POA: Diagnosis not present

## 2019-04-21 DIAGNOSIS — N186 End stage renal disease: Secondary | ICD-10-CM | POA: Diagnosis not present

## 2019-04-21 NOTE — Telephone Encounter (Addendum)
  [place] VALVE TEAM   Patient contacted regarding discharge from [facility] on 04/20/19  Patient understands to follow up with provider Nell Range on 11/5 at [address] as virtual visit Patient understands discharge instructions? yes Patient understands medications and regimen? yes Patient understands to bring all medications to this visit? Yes  He has a small temp of 99 at HD. They will continue to monitor.   [name] PA-C  [facility]

## 2019-04-23 DIAGNOSIS — N186 End stage renal disease: Secondary | ICD-10-CM | POA: Diagnosis not present

## 2019-04-23 DIAGNOSIS — Z992 Dependence on renal dialysis: Secondary | ICD-10-CM | POA: Diagnosis not present

## 2019-04-24 DIAGNOSIS — Z992 Dependence on renal dialysis: Secondary | ICD-10-CM | POA: Diagnosis not present

## 2019-04-24 DIAGNOSIS — N186 End stage renal disease: Secondary | ICD-10-CM | POA: Diagnosis not present

## 2019-04-25 ENCOUNTER — Telehealth: Payer: Self-pay | Admitting: Cardiovascular Disease

## 2019-04-25 ENCOUNTER — Encounter: Payer: Self-pay | Admitting: Family Medicine

## 2019-04-25 NOTE — Telephone Encounter (Signed)
Called patient back to let him know he can drive a week after his surgery, per nurse navigator for TAVR. Patient verbalized understanding.

## 2019-04-25 NOTE — Telephone Encounter (Signed)
  Wife is calling because patient was told after his heart valve stent that he should not drive a car. She is asking when he can drive again. He has an appt Thursday morning 11/5 that no one is available to drive him. Can he drive about 30 miles?

## 2019-04-26 ENCOUNTER — Telehealth: Payer: Medicare PPO | Admitting: Physician Assistant

## 2019-04-26 DIAGNOSIS — N186 End stage renal disease: Secondary | ICD-10-CM | POA: Diagnosis not present

## 2019-04-26 DIAGNOSIS — Z992 Dependence on renal dialysis: Secondary | ICD-10-CM | POA: Diagnosis not present

## 2019-04-26 NOTE — Progress Notes (Signed)
HEART AND VASCULAR CENTER   MULTIDISCIPLINARY HEART VALVE TEAM  Virtual Visit via Video Note   This visit type was conducted due to national recommendations for restrictions regarding the COVID-19 Pandemic (e.g. social distancing) in an effort to limit this patient's exposure and mitigate transmission in our community.  Due to his co-morbid illnesses, this patient is at least at moderate risk for complications without adequate follow up.  This format is felt to be most appropriate for this patient at this time.  All issues noted in this document were discussed and addressed.  A limited physical exam was performed with this format.  Please refer to the patient's chart for his consent to telehealth for [facility].   Evaluation Performed:  Follow-up visit  Date:  04/27/2019   ID:  Terry [name]., DOB 1935-05-26, MRN [id number]  Patient Location: Home Provider Location: Office  PCP:  No primary care provider on file.  Cardiologist:  Lauree Chandler, MD / Dr. Buena Irish & Dr. Roxy Manns (TAVR)  Chief Complaint:  [facility] s/p TAVR  History of Present Illness:    Terry [name]. is a 84 y.o. 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   The patient does not have symptoms concerning for COVID-19 infection (fever, chills, cough, or new shortness of breath).   Patient's cardiac history dates back more than 20 years ago when he first presented with ischemic heart disease. He underwent PCI and stenting of the right coronary artery at that time. He developed paroxysmal atrial fibrillation in December 2015 and was initially anticoagulated using warfarin but had  trouble tolerating it. He was switched to Eliquis but this was stopped because of progressive renal failure. In March 2019 he was hospitalized in [place]. Echocardiogram revealed moderate to severe aortic stenosis with mean transvalvular gradient reported 21 mmHg but aortic valve area calculated only 0.7 cm. Diagnostic cardiac catheterization performed at that time revealed moderate nonobstructive disease in the left anterior descending coronary artery and the left circumflex but high-grade stenosis in the right coronary artery. He underwent rotational atherectomy of the ostial and mid right coronary artery with stenting using multiple drug-eluting stents at [facility] in [place]. In July 2019 he developed acute left lower extremity ischemia for which she was treated by Dr. Donnetta Hutching with femoral endarterectomy. Later that year he was hospitalized at [facility] in [place] due to pneumonia. Echocardiogram performed at that time was reported to demonstrate moderate aortic stenosis. 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 He has not had any recurrence. The patient was recently seen in follow-up by Dr. [name]. He described stable but progressive symptoms of exertional shortness of breath. Follow-up transthoracic echocardiogram performed February 16, 2019 revealed significant progression in the severity of aortic stenosis with preserved left ventricular systolic function. Peak velocity across the aortic valve was reported 3.5 m/s corresponding to mean transvalvular gradient estimated 29 mmHg but aortic valve area calculated only 0.53 cm with very low DVI reported 0.19. Ejection fraction was estimated 55  to 60%. Patient underwent diagnostic cardiac catheterization February 21, 2019 and revealed severe  single-vessel coronary artery disease including moderate nonobstructive disease in the left coronary circulation but high-grade 90% proximal and 95% mid stenosis of the right coronary artery. Pulmonary artery pressures were mildly elevated. Peak to peak and mean transvalvular gradients were reported 17 and 17.46 mmHg, respectively. The patient underwent successful primary balloon angioplasty of the high-grade stenosis in the right coronary artery.   He was evaluated by multidisciplinary valve team and set up for TAVR on 04/04/19. However, there was an issue with our hybrid OR and his surgery was rescheduled to 04/18/19. He underwent successful TAVR with a59mm Edwards Sapien 3 THV via the left carotidapproach on 04/18/19. Post operative echo showed EF 60-65%, normally functioning valve with a mean gradient of 5.5 mm Hg and no PVL. He was discharged on aspirin and plavix.   Today he presents for follow up. He has been doing well. He feels like he doesn't run out of breath of quickly as usually Going to HD as normal. Has some neck pain and his carotid site but hasn't had to take a pain pill. No orthopnea or PND.  Past Medical History:  Diagnosis Date  . Anemia   . Anxiety   . Arthritis    "hands, knees" (03/15/2017)  . CAD (coronary artery disease) [place], [name]  . Chronic atrial fibrillation ([place])   . Chronic diastolic (congestive) heart failure ([place])   . COPD (chronic obstructive pulmonary disease) ([place])   . Diverticulitis   . ESRD (end stage renal disease) on dialysis [facility])    "MWF; [facility], [place]" (03/15/2017)     [place] URINE  . History of CVA (cerebrovascular accident) 2012   denies residual on 03/15/2017  . History of GI bleed   . HLD (hyperlipidemia)   . Hypertension   . On home oxygen therapy    "1L typically at night" (03/15/2017)  . Osteoarthritis 10/22/2012  . PAD (peripheral artery disease) ([place])   . S/P TAVR (transcatheter aortic valve replacement)    s/p  TAVR with a 26 mm Edwards Sapien 3 via the carotid approach by Dr. Roxy Manns and Dr. [name]  . Severe aortic stenosis    Past Surgical History:  Procedure Laterality Date  . ABDOMINAL AORTOGRAM N/A 12/16/2017   Procedure: ABDOMINAL AORTOGRAM;  Surgeon: Conrad Boswell, MD;  Location: [place] CV LAB;  Service: Cardiovascular;  Laterality: N/A;  . APPENDECTOMY  1984  . AV FISTULA PLACEMENT Right 05/21/2015   Procedure: Right Arm Brachiocephalic ARTERIOVENOUS (AV) FISTULA CREATION;  Surgeon: Angelia Mould, MD;  Location: [place];  Service: Vascular;  Laterality: Right;  . CATARACT EXTRACTION W/ INTRAOCULAR LENS  IMPLANT, BILATERAL Bilateral   . COLECTOMY  2012   partial colon removed - twisted bowel  . CORONARY ANGIOPLASTY WITH STENT PLACEMENT  1999   2 stents  . CORONARY BALLOON ANGIOPLASTY N/A 02/21/2019   Procedure: CORONARY BALLOON ANGIOPLASTY;  Surgeon: Burnell Blanks, MD;  Location: [place] CV LAB;  Service: Cardiovascular;  Laterality: N/A;  . CYSTOSCOPY/RETROGRADE/URETEROSCOPY Bilateral 04/23/2014   Procedure: CYSTOSCOPY BILATERAL RETROGRADE,  LEFT URETEROSCOPY, LEFT STENT PLACEMENT;  Surgeon: Raynelle Bring, MD;  Location: [facility];  Service: Urology;  Laterality: Bilateral;  . ENDARTERECTOMY FEMORAL Left 12/29/2017   Procedure: Left Femoral ENDARTERECTOMY with Patch Angioplasty using Xenosure patch;  Surgeon: Rosetta Posner, MD;  Location: [place];  Service: Vascular;  Laterality: Left;  . EYE SURGERY Left    "  surgery to clean off lens after cataract OR"  . INGUINAL HERNIA REPAIR Right   . LOWER EXTREMITY ANGIOGRAPHY Bilateral 12/16/2017   Procedure: LOWER EXTREMITY ANGIOGRAPHY;  Surgeon: Conrad Franklin, MD;  Location: [place] CV LAB;  Service: Cardiovascular;  Laterality: Bilateral;  . RIGHT/LEFT HEART CATH AND CORONARY ANGIOGRAPHY N/A 02/21/2019   Procedure: RIGHT/LEFT HEART CATH AND CORONARY ANGIOGRAPHY;  Surgeon: Burnell Blanks, MD;  Location: [place] CV LAB;   Service: Cardiovascular;  Laterality: N/A;  . TEE WITHOUT CARDIOVERSION N/A 04/18/2019   Procedure: TRANSESOPHAGEAL ECHOCARDIOGRAM (TEE);  Surgeon: Burnell Blanks, MD;  Location: [place];  Service: Open Heart Surgery;  Laterality: N/A;  . TRANSCATHETER AORTIC VALVE REPLACEMENT, CAROTID Left 04/18/2019   Procedure: TRANSCATHETER AORTIC VALVE REPLACEMENT,LEFT CAROTID;  Surgeon: Burnell Blanks, MD;  Location: [place];  Service: Open Heart Surgery;  Laterality: Left;     Current Meds  Medication Sig  . acetaminophen (TYLENOL) 500 MG tablet Take 1,000 mg by mouth every 6 (six) hours as needed for mild pain or headache.   . albuterol (PROVENTIL HFA;VENTOLIN HFA) 108 (90 Base) MCG/ACT inhaler Inhale 2 puffs into the lungs every 4 (four) hours as needed for wheezing or shortness of breath.  [name] aspirin EC 81 MG tablet Take 81 mg by mouth daily. Does not take on dialysis days  . atorvastatin (LIPITOR) 80 MG tablet Take 80 mg by mouth daily.  . calcium acetate (PHOSLO) 667 MG capsule Take 667-1,334 mg by mouth See admin instructions. Take 2 capsules (1334 mg) by moth 3 times daily with each meal & take 1 capsule (667 mg) by mouth with each snack  . cetirizine (ZYRTEC) 10 MG tablet Take 10 mg by mouth daily as needed for allergies.   [name] clopidogrel (PLAVIX) 75 MG tablet Take 75 mg by mouth daily.  [name] dextromethorphan-guaiFENesin (MUCINEX DM) 30-600 MG 12hr tablet Take 1 tablet by mouth 2 (two) times daily as needed (cough/congestion.).   [name] ethyl chloride spray Apply 1 application topically every Monday, Wednesday, and Friday.   . furosemide (LASIX) 40 MG tablet Take 40 mg by mouth 2 (two) times a week. Saturdays & Sundays ONLY  . metoprolol succinate (TOPROL-XL) 50 MG 24 hr tablet Takes 50 mg by mouth on Sat, Sun, Tues and Thursday, and 25 mg on Sat, Mon, Wed and Fri  . midodrine (PROAMATINE) 10 MG tablet Take 10 mg by mouth See admin instructions. Take 10 mg by mouth on Mon/Wed/Fri at dialysis  as needed for low blood pressure  . oxyCODONE-acetaminophen (PERCOCET) 5-325 MG tablet Take 1-2 tablets by mouth every 6 (six) hours as needed for severe pain.  . OXYGEN Inhale 1 L into the lungs daily as needed (for shortness of breath).   [name] oxymetazoline (AFRIN) 0.05 % nasal spray Place 1 spray into both nostrils once as needed (to stop nose bleeds).   . pantoprazole (PROTONIX) 40 MG tablet Take 40 mg by mouth 2 (two) times daily before a meal. Breakfast & supper  . predniSONE (DELTASONE) 10 MG tablet Take 1 tablet (10 mg total) by mouth daily with breakfast.  . SYMBICORT 160-4.5 MCG/ACT inhaler Inhale 2 puffs by mouth twice daily     Allergies:   Yellow dyes (non-tartrazine), Levaquin [levofloxacin in d5w], and Levofloxacin   Social History   Tobacco Use  . Smoking status: Former Smoker    Packs/day: 1.00    Years: 49.00    Pack years: 49.00    Types: Cigarettes, Pipe, Landscape architect  Quit date: 02/21/2000    Years since quitting: 19.1  . Smokeless tobacco: Former Systems developer    Types: Chew  . Tobacco comment: "chewed when he was a kid"  Substance Use Topics  . Alcohol use: Yes    Comment: 03/15/2017 "nothing since the 1960's"  . Drug use: No     Family Hx: The patient's family history includes Cancer in his mother; Heart attack in his father; Heart disease in his father; Parkinson's disease in his brother.  ROS:   Please see the history of present illness.    All other systems reviewed and are negative.   Prior CV studies:   The following studies were reviewed today:  TAVR OPERATIVE NOTE   Date of Procedure:04/18/2019  Preoperative Diagnosis:Severe Aortic Stenosis   Postoperative Diagnosis:Same   Procedure:   Transcatheter Aortic Valve Replacement - OpenLeftTranscarotid Approach Edwards Sapien 3 THV (size 46mm, model # 9600TFX, serial # [id number])  Co-Surgeons:Clarence H. Roxy Manns, MD and  Lauree Chandler, MD  Anesthesiologist:Adam Marcie Bal, MD  Echocardiographer:Mihai Croitoru, MD  Pre-operative Echo Findings: ? Severe aortic stenosis ? Normalleft ventricular systolic function  Post-operative Echo Findings: ? Noparavalvular leak ? Normalleft ventricular systolic function  _________________   Echo 04/19/19: IMPRESSIONS 1. Left ventricular ejection fraction, by visual estimation, is 60 to 65%. The left ventricle has normal function. There is no left ventricular hypertrophy. 2. Elevated left ventricular end-diastolic pressure. 3. Left ventricular diastolic parameters are consistent with Grade II diastolic dysfunction (pseudonormalization). 4. Global right ventricle has normal systolic function.The right ventricular size is normal. No increase in right ventricular wall thickness. 5. Left atrial size was normal. 6. Right atrial size was normal. 7. The mitral valve is normal in structure. No evidence of mitral valve regurgitation. No evidence of mitral stenosis. 8. The tricuspid valve is normal in structure. Tricuspid valve regurgitation is not demonstrated. 9. Aortic valve regurgitation is not visualized. No evidence of aortic valve sclerosis or stenosis. 10. The pulmonic valve was normal in structure. Pulmonic valve regurgitation is not visualized. 11. The inferior vena cava is dilated in size with >50% respiratory variability, suggesting right atrial pressure of 8 mmHg.   Aortic Valve: The aortic valve has been repaired/replaced. Aortic valve regurgitation is not visualized. The aortic valve is structurally normal, with no evidence of sclerosis or stenosis. Aortic valve mean gradient measures 5.5 mmHg. Aortic valve peak  gradient measures 11.0 mmHg. Aortic valve area, by VTI measures 2.18 cm. 26 Edwards Sapien bioprosthetic, stented aortic valve (TAVR) valve is present in the aortic position. Procedure Date:  04/18/2019.  Labs/Other Tests and Data Reviewed:    EKG:  No ECG reviewed.  Recent Labs: 04/18/2019: ALT 15; B Natriuretic Peptide 260.6 04/19/2019: Magnesium 2.2 04/20/2019: BUN 26; Creatinine, Ser 4.65; Hemoglobin 11.2; Platelets 149; Potassium 4.7; Sodium 135   Recent Lipid Panel Lab Results  Component Value Date/Time   CHOL 121 03/31/2018 03:46 PM   TRIG 100 03/31/2018 03:46 PM   HDL 44 03/31/2018 03:46 PM   CHOLHDL 2.8 03/31/2018 03:46 PM   CHOLHDL 5 03/20/2015 10:01 AM   LDLCALC 57 03/31/2018 03:46 PM   LDLDIRECT 93.9 02/03/2013 09:33 AM    Wt Readings from Last 3 Encounters:  04/27/19 154 lb (69.9 kg)  04/20/19 158 lb 3.2 oz (71.8 kg)  03/30/19 155 lb (70.3 kg)     Objective:    Vital Signs:  BP (!) 130/50   Pulse 85   Ht 5\' 8"  (1.727 m)   Wt 154 lb (69.9 kg)  BMI 23.42 kg/m    Well nourished, well developed male in no acute distress.   ASSESSMENT & PLAN:    Severe AS s/p TAVR: doing well. Carotid site healing well by his report. Not seen well during video visit which was later converted to a telephone visit due to a poor connection. No s/s HAVB. SBE prophylaxis discussed; the patient is edentulous and does not go to the dentist. Groin site healing well. Continue aspirin and plavix. I will see him back in early December for 1 month follow up and echo.   ESRD: HD M, W, F in [place].  HTN: Bp well controlled today. No changes.   Chronic diastolic CHF: no s/s CHF. HD for volume management. He does that Lasix on sat and sundays.   HWY:SHUOHF POBA for ISR on 9/1. Continue on chronic aspirin and plavix, BB and statin. No chest pain.    COVID-19 Education: The signs and symptoms of COVID-19 were discussed with the patient and how to seek care for testing (follow up with PCP or arrange E-visit).  The importance of social distancing was discussed today.  Time:   Today, I have spent 15 minutes with the patient with telehealth technology discussing  the above problems.     Medication Adjustments/Labs and Tests Ordered: Current medicines are reviewed at length with the patient today.  Concerns regarding medicines are outlined above.   Tests Ordered: No orders of the defined types were placed in this encounter.   Medication Changes: No orders of the defined types were placed in this encounter.   Disposition:  Follow up in 1 month(s)  Signed, [name], PA-C  04/27/2019 1:25 PM    [facility]

## 2019-04-27 ENCOUNTER — Other Ambulatory Visit: Payer: Self-pay

## 2019-04-27 ENCOUNTER — Telehealth (INDEPENDENT_AMBULATORY_CARE_PROVIDER_SITE_OTHER): Payer: Medicare PPO | Admitting: Physician Assistant

## 2019-04-27 ENCOUNTER — Encounter: Payer: Self-pay | Admitting: Physician Assistant

## 2019-04-27 ENCOUNTER — Other Ambulatory Visit: Payer: Self-pay | Admitting: Internal Medicine

## 2019-04-27 VITALS — BP 130/50 | HR 85 | Ht 68.0 in | Wt 154.0 lb

## 2019-04-27 DIAGNOSIS — Z952 Presence of prosthetic heart valve: Secondary | ICD-10-CM

## 2019-04-27 DIAGNOSIS — I2510 Atherosclerotic heart disease of native coronary artery without angina pectoris: Secondary | ICD-10-CM

## 2019-04-27 DIAGNOSIS — N186 End stage renal disease: Secondary | ICD-10-CM

## 2019-04-27 DIAGNOSIS — I5032 Chronic diastolic (congestive) heart failure: Secondary | ICD-10-CM

## 2019-04-27 DIAGNOSIS — I10 Essential (primary) hypertension: Secondary | ICD-10-CM

## 2019-04-27 NOTE — Patient Instructions (Addendum)
Please continue on all your same medications. I will see you back December 05/25/19 for your echocardiogram 3pm (please arrive at 2:30pm) and then a follow up at 3pm. Please call us in the meantime if you have any questions or concerns.

## 2019-04-28 DIAGNOSIS — N186 End stage renal disease: Secondary | ICD-10-CM | POA: Diagnosis not present

## 2019-04-28 DIAGNOSIS — Z992 Dependence on renal dialysis: Secondary | ICD-10-CM | POA: Diagnosis not present

## 2019-05-01 DIAGNOSIS — Z992 Dependence on renal dialysis: Secondary | ICD-10-CM | POA: Diagnosis not present

## 2019-05-01 DIAGNOSIS — N186 End stage renal disease: Secondary | ICD-10-CM | POA: Diagnosis not present

## 2019-05-02 DIAGNOSIS — Z961 Presence of intraocular lens: Secondary | ICD-10-CM | POA: Diagnosis not present

## 2019-05-02 DIAGNOSIS — H1131 Conjunctival hemorrhage, right eye: Secondary | ICD-10-CM | POA: Diagnosis not present

## 2019-05-02 DIAGNOSIS — H16213 Exposure keratoconjunctivitis, bilateral: Secondary | ICD-10-CM | POA: Diagnosis not present

## 2019-05-02 DIAGNOSIS — H524 Presbyopia: Secondary | ICD-10-CM | POA: Diagnosis not present

## 2019-05-03 DIAGNOSIS — N186 End stage renal disease: Secondary | ICD-10-CM | POA: Diagnosis not present

## 2019-05-03 DIAGNOSIS — Z992 Dependence on renal dialysis: Secondary | ICD-10-CM | POA: Diagnosis not present

## 2019-05-05 DIAGNOSIS — N186 End stage renal disease: Secondary | ICD-10-CM | POA: Diagnosis not present

## 2019-05-05 DIAGNOSIS — Z992 Dependence on renal dialysis: Secondary | ICD-10-CM | POA: Diagnosis not present

## 2019-05-08 DIAGNOSIS — N186 End stage renal disease: Secondary | ICD-10-CM | POA: Diagnosis not present

## 2019-05-08 DIAGNOSIS — Z992 Dependence on renal dialysis: Secondary | ICD-10-CM | POA: Diagnosis not present

## 2019-05-10 DIAGNOSIS — Z992 Dependence on renal dialysis: Secondary | ICD-10-CM | POA: Diagnosis not present

## 2019-05-10 DIAGNOSIS — N186 End stage renal disease: Secondary | ICD-10-CM | POA: Diagnosis not present

## 2019-05-12 DIAGNOSIS — Z992 Dependence on renal dialysis: Secondary | ICD-10-CM | POA: Diagnosis not present

## 2019-05-12 DIAGNOSIS — J449 Chronic obstructive pulmonary disease, unspecified: Secondary | ICD-10-CM | POA: Diagnosis not present

## 2019-05-12 DIAGNOSIS — L0390 Cellulitis, unspecified: Secondary | ICD-10-CM | POA: Diagnosis not present

## 2019-05-12 DIAGNOSIS — I5031 Acute diastolic (congestive) heart failure: Secondary | ICD-10-CM | POA: Diagnosis not present

## 2019-05-12 DIAGNOSIS — N186 End stage renal disease: Secondary | ICD-10-CM | POA: Diagnosis not present

## 2019-05-15 DIAGNOSIS — N186 End stage renal disease: Secondary | ICD-10-CM | POA: Diagnosis not present

## 2019-05-15 DIAGNOSIS — Z992 Dependence on renal dialysis: Secondary | ICD-10-CM | POA: Diagnosis not present

## 2019-05-17 DIAGNOSIS — N186 End stage renal disease: Secondary | ICD-10-CM | POA: Diagnosis not present

## 2019-05-17 DIAGNOSIS — Z992 Dependence on renal dialysis: Secondary | ICD-10-CM | POA: Diagnosis not present

## 2019-05-19 DIAGNOSIS — Z992 Dependence on renal dialysis: Secondary | ICD-10-CM | POA: Diagnosis not present

## 2019-05-19 DIAGNOSIS — N186 End stage renal disease: Secondary | ICD-10-CM | POA: Diagnosis not present

## 2019-05-22 DIAGNOSIS — Z992 Dependence on renal dialysis: Secondary | ICD-10-CM | POA: Diagnosis not present

## 2019-05-22 DIAGNOSIS — N186 End stage renal disease: Secondary | ICD-10-CM | POA: Diagnosis not present

## 2019-05-23 DIAGNOSIS — Z992 Dependence on renal dialysis: Secondary | ICD-10-CM | POA: Diagnosis not present

## 2019-05-23 DIAGNOSIS — N186 End stage renal disease: Secondary | ICD-10-CM | POA: Diagnosis not present

## 2019-05-24 DIAGNOSIS — N186 End stage renal disease: Secondary | ICD-10-CM | POA: Diagnosis not present

## 2019-05-24 DIAGNOSIS — Z992 Dependence on renal dialysis: Secondary | ICD-10-CM | POA: Diagnosis not present

## 2019-05-25 ENCOUNTER — Ambulatory Visit (INDEPENDENT_AMBULATORY_CARE_PROVIDER_SITE_OTHER): Payer: Medicare PPO | Admitting: Physician Assistant

## 2019-05-25 ENCOUNTER — Other Ambulatory Visit: Payer: Self-pay

## 2019-05-25 ENCOUNTER — Encounter: Payer: Self-pay | Admitting: Physician Assistant

## 2019-05-25 ENCOUNTER — Ambulatory Visit (HOSPITAL_COMMUNITY): Payer: Medicare PPO | Attending: Cardiovascular Disease

## 2019-05-25 VITALS — BP 134/68 | HR 84 | Ht 68.0 in | Wt 157.0 lb

## 2019-05-25 DIAGNOSIS — N186 End stage renal disease: Secondary | ICD-10-CM

## 2019-05-25 DIAGNOSIS — Z952 Presence of prosthetic heart valve: Secondary | ICD-10-CM | POA: Diagnosis not present

## 2019-05-25 DIAGNOSIS — I2510 Atherosclerotic heart disease of native coronary artery without angina pectoris: Secondary | ICD-10-CM | POA: Diagnosis not present

## 2019-05-25 DIAGNOSIS — I5032 Chronic diastolic (congestive) heart failure: Secondary | ICD-10-CM | POA: Diagnosis not present

## 2019-05-25 DIAGNOSIS — I10 Essential (primary) hypertension: Secondary | ICD-10-CM | POA: Diagnosis not present

## 2019-05-25 NOTE — Patient Instructions (Addendum)
Medication Instructions:  Your provider recommends that you continue on your current medications as directed. Please refer to the Current Medication list given to you today.    Follow-Up: You have an appointment with Dr. [name] on September 06, 2019 at 2:00PM.  We will call you to arrange your 1 year TAVR echo and office visit!

## 2019-05-25 NOTE — Progress Notes (Signed)
[facility] [place]                                       Cardiology Office Note    Date:  05/25/2019   ID:  Terry [name]., DOB April 11, 1935, MRN [id number]  PCP:  Terry Kern, DO  Cardiologist: Terry Chandler, MD/ Dr. Buena [name] & Dr. Roxy [name] (TAVR)  CC: 1 month s/p TAVR  History of Present Illness:  Terry [name]. is a 84 y.o. male with a history of persistent atrial fibrillation (not on [facility] given history of GI bleeding), HTN, HLD, prior CVA, PAD s/p L endarterectomy, COPD, CAD s/p multiple PCIs (recent POBA for ISR on 9/1), ESRD on HD (M,W, F), iatrogenic pseudoaneurysm s/p US guided compression, and severe paradoxical LFLG aortic stenosis s/p TAVR (04/18/19) who presents to clinic for follow up.   Patient's cardiac history dates back more than 20 years ago when he first presented with ischemic heart disease. He underwent PCI and stenting of the right coronary artery at that time. He developed paroxysmal atrial fibrillation in December 2015 and was initially anticoagulated using warfarin but had trouble tolerating it. He was switched to Eliquis but this was stopped because of progressive renal failure. In March 2019 he was hospitalized in [place]. Echocardiogram revealed moderate to severe aortic stenosis with mean transvalvular gradient reported 21 mmHg but aortic valve area calculated only 0.7 cm. Diagnostic cardiac catheterization performed at that time revealed moderate nonobstructive disease in the left anterior descending coronary artery and the left circumflex but high-grade stenosis in the right coronary artery. He underwent rotational atherectomy of the ostial and mid right coronary artery with stenting using multiple drug-eluting stents at [facility] in [place]. In July 2019 he developed acute left lower extremity ischemia for which she was treated by Terry [name] with femoral endarterectomy. Later that year he was  hospitalized at [facility] in [place] due to pneumonia. Echocardiogram performed at that time was reported to demonstrate moderate aortic stenosis. 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 He has not had any recurrence. The patient was recently seen in follow-up by Dr. Angelena [name]. He describedstable but progressive symptoms of exertional shortness of breath. Follow-up transthoracic echocardiogram performed February 16, 2019 revealed significant progression in the severity of aortic stenosis with preserved left ventricular systolic function. Peak velocity across the aortic valve was reported 3.5 m/s corresponding to mean transvalvular gradient estimated 29 mmHg but aortic valve area calculated only 0.53 cm with very low DVI reported 0.19. Ejection fraction was estimated 55 to 60%. Patient underwent diagnostic cardiac catheterization February 21, 2019 and revealed severe single-vessel coronary artery disease including moderate nonobstructive disease in the left coronary circulation but high-grade 90% proximal and 95% mid stenosis of the right coronary artery. Pulmonary artery pressures were mildly elevated. Peak to peak and mean transvalvular gradients were reported 17 and 17.46 mmHg, respectively. The patient underwent successful primary balloon angioplasty of the high-grade stenosis in the right coronary artery.  He was evaluated by multidisciplinary valve team and set up for TAVR on 04/04/19.However, there was an issue with our hybrid OR and his surgery was rescheduled to 04/18/19.He underwent successful TAVR with a27mm Terry [name]  3 THV via the left carotidapproach on 04/18/19. Post operative echoshowed EF 60-65%, normally functioning valve with a mean gradient of 5.5 mm Hg and no PVL. He  has been continued on his chronicAsprin andPlavix (but apirin changed daily).  Today he presents to clinic for follow up. He is here with his wife, Terry [name]. He can tell a difference in his stamina and ability to walk since TAVR. He cannot tell any appreciable difference in how he tolerates hemodialysis since TAVR. No Le edema. He has chronic orthopnea that is unchanged. No PRN. No dizziness or syncope. No blood in stool or urine.   Past Medical History:  Diagnosis Date   Anemia    Anxiety    Arthritis    "hands, knees" (03/15/2017)   CAD (coronary artery disease) 1999   [place], [place]   Chronic atrial fibrillation (HCC)    Chronic diastolic (congestive) heart failure (HCC)    COPD (chronic obstructive pulmonary disease) ([place])    Diverticulitis    ESRD (end stage renal disease) on dialysis ([place])    "MWF; [facility] [place]" (03/15/2017)     Page URINE   History of CVA (cerebrovascular accident) 2012   denies residual on 03/15/2017   History of GI bleed    HLD (hyperlipidemia)    Hypertension    On home oxygen therapy    "1L typically at night" (03/15/2017)   Osteoarthritis 10/22/2012   PAD (peripheral artery disease) (HCC)    S/P TAVR (transcatheter aortic valve replacement)    s/p TAVR with a 26 mm Edwards Sapien 3 via the carotid approach by Dr. Roxy [name] and Dr. Angelena [name]   Severe aortic stenosis     Past Surgical History:  Procedure Laterality Date   ABDOMINAL AORTOGRAM N/A 12/16/2017   Procedure: ABDOMINAL AORTOGRAM;  Surgeon: Conrad Carpentersville, MD;  Location: [place] [facility];  Service: Cardiovascular;  Laterality: N/A;   APPENDECTOMY  1984   AV FISTULA PLACEMENT Right 05/21/2015   Procedure: Right Arm Brachiocephalic ARTERIOVENOUS (AV) FISTULA CREATION;  Surgeon: Angelia Mould, MD;  Location: [place];  Service: Vascular;  Laterality: Right;   CATARACT EXTRACTION W/ INTRAOCULAR LENS  IMPLANT, BILATERAL Bilateral    COLECTOMY  2012   partial colon  removed - twisted bowel   CORONARY ANGIOPLASTY WITH STENT PLACEMENT  1999   2 stents   CORONARY BALLOON ANGIOPLASTY N/A 02/21/2019   Procedure: CORONARY BALLOON ANGIOPLASTY;  Surgeon: Burnell Blanks, MD;  Location: [place] [facility];  Service: Cardiovascular;  Laterality: N/A;   CYSTOSCOPY/RETROGRADE/URETEROSCOPY Bilateral 04/23/2014   Procedure: CYSTOSCOPY BILATERAL RETROGRADE,  LEFT URETEROSCOPY, LEFT STENT PLACEMENT;  Surgeon: Raynelle Bring, MD;  Location: [facility];  Service: Urology;  Laterality: Bilateral;   ENDARTERECTOMY FEMORAL Left 12/29/2017   Procedure: Left Femoral ENDARTERECTOMY with Patch Angioplasty using Xenosure patch;  Surgeon: Rosetta Posner, MD;  Location: [facility];  Service: Vascular;  Laterality: Left;   EYE SURGERY Left    "surgery to clean off lens after cataract OR"   INGUINAL HERNIA REPAIR Right    LOWER EXTREMITY ANGIOGRAPHY Bilateral 12/16/2017   Procedure: LOWER EXTREMITY ANGIOGRAPHY;  Surgeon: Conrad Duenweg, MD;  Location: [place] [facility];  Service: Cardiovascular;  Laterality: Bilateral;   RIGHT/LEFT HEART CATH AND CORONARY ANGIOGRAPHY N/A 02/21/2019   Procedure: RIGHT/LEFT HEART CATH AND CORONARY ANGIOGRAPHY;  Surgeon: Burnell Blanks, MD;  Location: [place] [facility];  Service: Cardiovascular;  Laterality: N/A;   TEE WITHOUT CARDIOVERSION N/A 04/18/2019  Procedure: TRANSESOPHAGEAL ECHOCARDIOGRAM (TEE);  Surgeon: Burnell Blanks, MD;  Location: [place];  Service: Open Heart Surgery;  Laterality: N/A;   TRANSCATHETER AORTIC VALVE REPLACEMENT, CAROTID Left 04/18/2019   Procedure: TRANSCATHETER AORTIC VALVE REPLACEMENT,LEFT CAROTID;  Surgeon: Burnell Blanks, MD;  Location: [place];  Service: Open Heart Surgery;  Laterality: Left;    Current Medications: Outpatient Medications Prior to Visit  Medication Sig Dispense Refill   acetaminophen (TYLENOL) 500 MG tablet Take 1,000 mg by mouth every 6 (six) hours as needed for mild pain or  headache.      albuterol (PROVENTIL HFA;VENTOLIN HFA) 108 (90 Base) MCG/ACT inhaler Inhale 2 puffs into the lungs every 4 (four) hours as needed for wheezing or shortness of breath. 1 Inhaler 0   aspirin EC 81 MG tablet Take 81 mg by mouth daily. Does not take on dialysis days     atorvastatin (LIPITOR) 80 MG tablet Take 80 mg by mouth daily.     calcium acetate (PHOSLO) 667 MG capsule Take 667-1,334 mg by mouth See admin instructions. Take 2 capsules (1334 mg) by moth 3 times daily with each meal & take 1 capsule (667 mg) by mouth with each snack     cetirizine (ZYRTEC) 10 MG tablet Take 10 mg by mouth daily as needed for allergies.      clopidogrel (PLAVIX) 75 MG tablet Take 75 mg by mouth daily.     dextromethorphan-guaiFENesin (MUCINEX DM) 30-600 MG 12hr tablet Take 1 tablet by mouth 2 (two) times daily as needed (cough/congestion.).      ethyl chloride spray Apply 1 application topically every Monday, Wednesday, and Friday.      furosemide (LASIX) 40 MG tablet Take 40 mg by mouth 2 (two) times a week. Saturdays & Sundays ONLY     metoprolol succinate (TOPROL-XL) 50 MG 24 hr tablet Takes 50 mg by mouth on Sat, Sun, Tues and Thursday, and 25 mg on Sat, Mon, Wed and Fri     midodrine (PROAMATINE) 10 MG tablet Take 10 mg by mouth See admin instructions. Take 10 mg by mouth on Mon/Wed/Fri at dialysis as needed for low blood pressure     oxyCODONE-acetaminophen (PERCOCET) 5-325 MG tablet Take 1-2 tablets by mouth every 6 (six) hours as needed for severe pain. 10 tablet 0   OXYGEN Inhale 1 L into the lungs daily as needed (for shortness of breath).      oxymetazoline (AFRIN) 0.05 % nasal spray Place 1 spray into both nostrils once as needed (to stop nose bleeds).      pantoprazole (PROTONIX) 40 MG tablet Take 40 mg by mouth 2 (two) times daily before a meal. Breakfast & supper     predniSONE (DELTASONE) 10 MG tablet Take 1 tablet by mouth once daily with breakfast 100 tablet 0    SYMBICORT 160-4.5 MCG/ACT inhaler Inhale 2 puffs by mouth twice daily 11 g 4   No facility-administered medications prior to visit.      Allergies:   Yellow dyes (non-tartrazine), Levaquin [levofloxacin in d5w], and Levofloxacin   Social History   Socioeconomic History   Marital status: Married    Spouse name: Not on file   Number of children: 3   Years of education: Not on file   Highest education level: Not on file  Occupational History   Occupation: retired-worked in Writer: [place] resource strain: Not on file   Food insecurity    Worry:  Not on file    Inability: Not on file   Transportation needs    Medical: Not on file    Non-medical: Not on file  Tobacco Use   Smoking status: Former Smoker    Packs/day: 1.00    Years: 49.00    Pack years: 49.00    Types: Cigarettes, Pipe, Cigars    Quit date: 02/21/2000    Years since quitting: 19.2   Smokeless tobacco: Former Systems developer    Types: Chew   Tobacco comment: "chewed when he was a kid"  Substance and Sexual Activity   Alcohol use: Yes    Comment: 03/15/2017 "nothing since the 1960's"   Drug use: No   Sexual activity: Never  Lifestyle   Physical activity    Days per week: Not on file    Minutes per session: Not on file   Stress: Not on file  Relationships   Social connections    Talks on phone: Not on file    Gets together: Not on file    Attends religious service: Not on file    Active member of club or organization: Not on file    Attends meetings of clubs or organizations: Not on file    Relationship status: Not on file  Other Topics Concern   Not on file  Social History Narrative   Work or School: retired [employer] - [employer]      Home Situation: lives with wife in [place] Beliefs: [place] regular; poor diet           Family History:  The patient's family history includes Cancer in his mother; Heart attack in his  father; Heart disease in his father; Parkinson's disease in his brother.     ROS:   Please see the history of present illness.    ROS All other systems reviewed and are negative.   PHYSICAL EXAM:   VS:  BP 134/68    Pulse 84    Ht 5\' 8"  (1.727 m)    Wt 157 lb (71.2 kg)    SpO2 92%    BMI 23.87 kg/m    GEN: Well nourished, well developed, in no acute distress HEENT: normal Neck: no JVD or masses Cardiac: RRR; soft flow murmur. No rubs, or gallops,no edema  Respiratory:  clear to auscultation bilaterally, normal work of breathing GI: soft, nontender, nondistended, + BS MS: no deformity or atrophy Skin: warm and dry, no rash. Carotid site healing well. Chronic venous stasis changes on arms and hands Neuro:  Alert and Oriented x 3, Strength and sensation are intact Psych: euthymic mood, full affect   Wt Readings from Last 3 Encounters:  05/25/19 157 lb (71.2 kg)  04/27/19 154 lb (69.9 kg)  04/20/19 158 lb 3.2 oz (71.8 kg)      Studies/Labs Reviewed:   EKG:  EKG is NOT ordered today  Recent Labs: 04/18/2019: ALT 15; B Natriuretic Peptide 260.6 04/19/2019: Magnesium 2.2 04/20/2019: BUN 26; Creatinine, Ser 4.65; Hemoglobin 11.2; Platelets 149; Potassium 4.7; Sodium 135   Lipid Panel    Component Value Date/Time   CHOL 121 03/31/2018 1546   TRIG 100 03/31/2018 1546   HDL 44 03/31/2018 1546   CHOLHDL 2.8 03/31/2018 1546   CHOLHDL 5 03/20/2015 1001   VLDL 38.8 03/20/2015 1001   LDLCALC 57 03/31/2018 1546   LDLDIRECT 93.9 02/03/2013 0933    Additional studies/ records that were reviewed today include:  TAVR OPERATIVE NOTE   Date of Procedure:04/18/2019  Preoperative Diagnosis:Severe Aortic Stenosis   Postoperative Diagnosis:Same   Procedure:   Transcatheter Aortic Valve Replacement - OpenLeftTranscarotid Approach Edwards Sapien 3 THV (size 75mm, model # 9600TFX, serial #  [id number])  Co-Surgeons:Clarence H. Terry Manns, MD and Terry Chandler, MD  Anesthesiologist:Adam Marcie Bal, MD  Echocardiographer:Mihai Croitoru, MD  Pre-operative Echo Findings: ? Severe aortic stenosis ? Normalleft ventricular systolic function  Post-operative Echo Findings: ? Noparavalvular leak ? Normalleft ventricular systolic function  _________________   Echo 04/19/19: IMPRESSIONS 1. Left ventricular ejection fraction, by visual estimation, is 60 to 65%. The left ventricle has normal function. There is no left ventricular hypertrophy. 2. Elevated left ventricular end-diastolic pressure. 3. Left ventricular diastolic parameters are consistent with Grade II diastolic dysfunction (pseudonormalization). 4. Global right ventricle has normal systolic function.The right ventricular size is normal. No increase in right ventricular wall thickness. 5. Left atrial size was normal. 6. Right atrial size was normal. 7. The mitral valve is normal in structure. No evidence of mitral valve regurgitation. No evidence of mitral stenosis. 8. The tricuspid valve is normal in structure. Tricuspid valve regurgitation is not demonstrated. 9. Aortic valve regurgitation is not visualized. No evidence of aortic valve sclerosis or stenosis. 10. The pulmonic valve was normal in structure. Pulmonic valve regurgitation is not visualized. 11. The inferior vena cava is dilated in size with >50% respiratory variability, suggesting right atrial pressure of 8 mmHg.   Aortic Valve: The aortic valve has been repaired/replaced. Aortic valve regurgitation is not visualized. The aortic valve is structurally normal, with no evidence of sclerosis or stenosis. Aortic valve mean gradient measures 5.5 mmHg. Aortic valve peak  gradient measures 11.0 mmHg. Aortic valve area, by VTI measures 2.18 cm. 26 Edwards Sapien bioprosthetic,  stented aortic valve (TAVR) valve is present in the aortic position. Procedure Date: 04/18/2019.   _________________   Echo 05/25/2019 IMPRESSIONS  1. Left ventricular ejection fraction, by visual estimation, is 60 to 65%. The left ventricle has normal function. There is moderately increased left ventricular hypertrophy.  2. Left ventricular diastolic function could not be evaluated.  3. Global right ventricle has normal systolic function.The right ventricular size is normal. No increase in right ventricular wall thickness.  4. Left atrial size was mildly dilated.  5. Right atrial size was normal.  6. Moderate mitral annular calcification.  7. The mitral valve is normal in structure. Trace mitral valve regurgitation. No evidence of mitral stenosis.  8. The tricuspid valve is normal in structure. Tricuspid valve regurgitation is mild.  9. Aortic valve regurgitation is not visualized. No evidence of aortic valve sclerosis or stenosis. 10. The pulmonic valve was normal in structure. Pulmonic valve regurgitation is not visualized. 11. The inferior vena cava is normal in size with greater than 50% respiratory variability, suggesting right atrial pressure of 3 mmHg.  Aortic Valve: The aortic valve has been repaired/replaced. Aortic valve regurgitation is not visualized. The aortic valve is structurally normal, with no evidence of sclerosis or stenosis. Aortic valve mean gradient measures 8.0 mmHg. Aortic valve peak gradient measures 10.2 mmHg. [place], stented aortic valve (TAVR) valve is present in the aortic position. There is trivial paravalvular leak. AVA 1.8 cm2.   ASSESSMENT & PLAN:   Severe AS s/p TAVR: echo today shows EF 60-65% normally functioning TAVR with a mean gradient of 10.26mm Hg and no PVL.  He has NYHA class II symptoms, but he can tell a difference in his stamina and ability  to walk since TAVR.  SBE prophylaxis discussed; he has dentures and does not go to the  dentist. Continue chronic ASA and Plavix. He does not take aspirin on HD days due to issues with bleeding.   ESRD: HD M, W, F in [place].  HTN: BP well controlled today. BP runs low on HD days. Continue midodrine   Chronic diastolic CHF: no s/s CHF. HD for volume management. He does that Lasix on the weekend days.  GOT:LXBWIO POBA for ISR on 9/1. Continue on chronic aspirin and plavix, BB and statin.    Medication Adjustments/Labs and Tests Ordered: Current medicines are reviewed at length with the patient today.  Concerns regarding medicines are outlined above.  Medication changes, Labs and Tests ordered today are listed in the Patient Instructions below. Patient Instructions  Medication Instructions:  Your provider recommends that you continue on your current medications as directed. Please refer to the Current Medication list given to you today.    Follow-Up: You have an appointment with Dr. Angelena [name] on September 06, 2019 at 2:00PM.  We will call you to arrange your 1 year TAVR echo and office visit!    Signed, Terry Form, PA-C  05/25/2019 4:41 PM    [facility] [address], [place], [place]  [address] Phone: [phone]; Fax: [phone]

## 2019-05-26 DIAGNOSIS — H02535 Eyelid retraction left lower eyelid: Secondary | ICD-10-CM | POA: Diagnosis not present

## 2019-05-26 DIAGNOSIS — H02115 Cicatricial ectropion of left lower eyelid: Secondary | ICD-10-CM | POA: Diagnosis not present

## 2019-05-26 DIAGNOSIS — H02112 Cicatricial ectropion of right lower eyelid: Secondary | ICD-10-CM | POA: Diagnosis not present

## 2019-05-26 DIAGNOSIS — H02532 Eyelid retraction right lower eyelid: Secondary | ICD-10-CM | POA: Diagnosis not present

## 2019-05-26 DIAGNOSIS — Z992 Dependence on renal dialysis: Secondary | ICD-10-CM | POA: Diagnosis not present

## 2019-05-26 DIAGNOSIS — N186 End stage renal disease: Secondary | ICD-10-CM | POA: Diagnosis not present

## 2019-05-29 DIAGNOSIS — N186 End stage renal disease: Secondary | ICD-10-CM | POA: Diagnosis not present

## 2019-05-29 DIAGNOSIS — Z992 Dependence on renal dialysis: Secondary | ICD-10-CM | POA: Diagnosis not present

## 2019-05-30 ENCOUNTER — Encounter: Payer: Self-pay | Admitting: Family Medicine

## 2019-05-31 DIAGNOSIS — N186 End stage renal disease: Secondary | ICD-10-CM | POA: Diagnosis not present

## 2019-05-31 DIAGNOSIS — Z992 Dependence on renal dialysis: Secondary | ICD-10-CM | POA: Diagnosis not present

## 2019-06-02 DIAGNOSIS — N186 End stage renal disease: Secondary | ICD-10-CM | POA: Diagnosis not present

## 2019-06-02 DIAGNOSIS — Z992 Dependence on renal dialysis: Secondary | ICD-10-CM | POA: Diagnosis not present

## 2019-06-05 DIAGNOSIS — N186 End stage renal disease: Secondary | ICD-10-CM | POA: Diagnosis not present

## 2019-06-05 DIAGNOSIS — Z992 Dependence on renal dialysis: Secondary | ICD-10-CM | POA: Diagnosis not present

## 2019-06-06 ENCOUNTER — Ambulatory Visit: Payer: Medicare PPO | Admitting: Internal Medicine

## 2019-06-07 DIAGNOSIS — N186 End stage renal disease: Secondary | ICD-10-CM | POA: Diagnosis not present

## 2019-06-07 DIAGNOSIS — Z992 Dependence on renal dialysis: Secondary | ICD-10-CM | POA: Diagnosis not present

## 2019-06-08 DIAGNOSIS — N186 End stage renal disease: Secondary | ICD-10-CM | POA: Diagnosis not present

## 2019-06-08 DIAGNOSIS — Z992 Dependence on renal dialysis: Secondary | ICD-10-CM | POA: Diagnosis not present

## 2019-06-09 DIAGNOSIS — Z992 Dependence on renal dialysis: Secondary | ICD-10-CM | POA: Diagnosis not present

## 2019-06-09 DIAGNOSIS — N186 End stage renal disease: Secondary | ICD-10-CM | POA: Diagnosis not present

## 2019-06-11 DIAGNOSIS — L0390 Cellulitis, unspecified: Secondary | ICD-10-CM | POA: Diagnosis not present

## 2019-06-11 DIAGNOSIS — I5031 Acute diastolic (congestive) heart failure: Secondary | ICD-10-CM | POA: Diagnosis not present

## 2019-06-11 DIAGNOSIS — J449 Chronic obstructive pulmonary disease, unspecified: Secondary | ICD-10-CM | POA: Diagnosis not present

## 2019-06-11 DIAGNOSIS — Z992 Dependence on renal dialysis: Secondary | ICD-10-CM | POA: Diagnosis not present

## 2019-06-12 DIAGNOSIS — Z992 Dependence on renal dialysis: Secondary | ICD-10-CM | POA: Diagnosis not present

## 2019-06-12 DIAGNOSIS — N186 End stage renal disease: Secondary | ICD-10-CM | POA: Diagnosis not present

## 2019-06-13 ENCOUNTER — Telehealth: Payer: Self-pay | Admitting: Physician Assistant

## 2019-06-13 DIAGNOSIS — Z7982 Long term (current) use of aspirin: Secondary | ICD-10-CM | POA: Diagnosis not present

## 2019-06-13 DIAGNOSIS — J449 Chronic obstructive pulmonary disease, unspecified: Secondary | ICD-10-CM | POA: Diagnosis not present

## 2019-06-13 DIAGNOSIS — Z87891 Personal history of nicotine dependence: Secondary | ICD-10-CM | POA: Diagnosis not present

## 2019-06-13 DIAGNOSIS — M25571 Pain in right ankle and joints of right foot: Secondary | ICD-10-CM | POA: Diagnosis not present

## 2019-06-13 DIAGNOSIS — Z7902 Long term (current) use of antithrombotics/antiplatelets: Secondary | ICD-10-CM | POA: Diagnosis not present

## 2019-06-13 DIAGNOSIS — M79674 Pain in right toe(s): Secondary | ICD-10-CM | POA: Diagnosis not present

## 2019-06-13 DIAGNOSIS — S92424A Nondisplaced fracture of distal phalanx of right great toe, initial encounter for closed fracture: Secondary | ICD-10-CM | POA: Diagnosis not present

## 2019-06-13 DIAGNOSIS — M79671 Pain in right foot: Secondary | ICD-10-CM | POA: Diagnosis not present

## 2019-06-13 DIAGNOSIS — M79604 Pain in right leg: Secondary | ICD-10-CM | POA: Diagnosis not present

## 2019-06-13 DIAGNOSIS — M86171 Other acute osteomyelitis, right ankle and foot: Secondary | ICD-10-CM | POA: Diagnosis not present

## 2019-06-13 DIAGNOSIS — M84474A Pathological fracture, right foot, initial encounter for fracture: Secondary | ICD-10-CM | POA: Diagnosis not present

## 2019-06-13 DIAGNOSIS — I82401 Acute embolism and thrombosis of unspecified deep veins of right lower extremity: Secondary | ICD-10-CM | POA: Diagnosis not present

## 2019-06-13 NOTE — Telephone Encounter (Signed)
Ms. Dosher is with her husband in the emergency room at [facility].  He has been diagnosed with a DVT.  He is not currently short of breath.  I spoke with the ER MD.  I explained that Mr. Hreha had not previously been on anticoagulation because of a history of GI bleed.  However, it is appropriate to do so at this time.  The doctor wants to start him on Eliquis.  He has consulted the Nephrologist, and the dosage is to be 2.5 mg twice daily.  I spoke with Ms. Tomes and explained the reasoning behind starting anticoagulation at this time.  She understands.  She wishes to follow-up with Dr. [name] as scheduled, and says that was supposed to be in June.  I will route this to Dr. [name] and [facility] scheduling so the appointment can be made, and the patient called.  Rosaria Ferries, PA-C 06/13/2019 6:08 PM Beeper [phone]

## 2019-06-14 DIAGNOSIS — N186 End stage renal disease: Secondary | ICD-10-CM | POA: Diagnosis not present

## 2019-06-14 DIAGNOSIS — Z992 Dependence on renal dialysis: Secondary | ICD-10-CM | POA: Diagnosis not present

## 2019-06-14 DIAGNOSIS — I82491 Acute embolism and thrombosis of other specified deep vein of right lower extremity: Secondary | ICD-10-CM | POA: Diagnosis not present

## 2019-06-14 DIAGNOSIS — L03115 Cellulitis of right lower limb: Secondary | ICD-10-CM | POA: Diagnosis not present

## 2019-06-17 DIAGNOSIS — Z992 Dependence on renal dialysis: Secondary | ICD-10-CM | POA: Diagnosis not present

## 2019-06-17 DIAGNOSIS — N186 End stage renal disease: Secondary | ICD-10-CM | POA: Diagnosis not present

## 2019-06-19 DIAGNOSIS — N186 End stage renal disease: Secondary | ICD-10-CM | POA: Diagnosis not present

## 2019-06-19 DIAGNOSIS — Z992 Dependence on renal dialysis: Secondary | ICD-10-CM | POA: Diagnosis not present

## 2019-06-20 DIAGNOSIS — I2510 Atherosclerotic heart disease of native coronary artery without angina pectoris: Secondary | ICD-10-CM | POA: Diagnosis not present

## 2019-06-20 DIAGNOSIS — E1151 Type 2 diabetes mellitus with diabetic peripheral angiopathy without gangrene: Secondary | ICD-10-CM | POA: Diagnosis not present

## 2019-06-20 DIAGNOSIS — E1122 Type 2 diabetes mellitus with diabetic chronic kidney disease: Secondary | ICD-10-CM | POA: Diagnosis not present

## 2019-06-20 DIAGNOSIS — N186 End stage renal disease: Secondary | ICD-10-CM | POA: Diagnosis not present

## 2019-06-20 DIAGNOSIS — Z7901 Long term (current) use of anticoagulants: Secondary | ICD-10-CM | POA: Diagnosis not present

## 2019-06-20 DIAGNOSIS — I120 Hypertensive chronic kidney disease with stage 5 chronic kidney disease or end stage renal disease: Secondary | ICD-10-CM | POA: Diagnosis not present

## 2019-06-20 DIAGNOSIS — I82401 Acute embolism and thrombosis of unspecified deep veins of right lower extremity: Secondary | ICD-10-CM | POA: Diagnosis not present

## 2019-06-20 DIAGNOSIS — J449 Chronic obstructive pulmonary disease, unspecified: Secondary | ICD-10-CM | POA: Diagnosis not present

## 2019-06-20 DIAGNOSIS — Z955 Presence of coronary angioplasty implant and graft: Secondary | ICD-10-CM | POA: Diagnosis not present

## 2019-06-20 DIAGNOSIS — I824Y1 Acute embolism and thrombosis of unspecified deep veins of right proximal lower extremity: Secondary | ICD-10-CM | POA: Diagnosis not present

## 2019-06-21 DIAGNOSIS — Z992 Dependence on renal dialysis: Secondary | ICD-10-CM | POA: Diagnosis not present

## 2019-06-21 DIAGNOSIS — N186 End stage renal disease: Secondary | ICD-10-CM | POA: Diagnosis not present

## 2019-06-22 ENCOUNTER — Telehealth: Payer: Self-pay | Admitting: Cardiovascular Disease

## 2019-06-22 NOTE — Telephone Encounter (Signed)
Pt c/o medication issue:  1. Name of Medication:   aspirin EC 81 MG tablet  clopidogrel (PLAVIX) 75 MG tablet Eliquis (apixaban) 2. How are you currently taking this medication (dosage and times per day)? 1 tablet by mouth daily  3. Are you having a reaction (difficulty breathing--STAT)? No  4. What is your medication issue? Patients wife is calling wanting to check with Dr. [name] that is okay for the patient to be taking all 3 medications at the same time.

## 2019-06-22 NOTE — Telephone Encounter (Signed)
Spoke with pt and spouse, they report pt has been diagnosed with DVT and started on Eliquis 2.5 mg BID.  They were not instructed at [facility] to stop Plavix or ASA.  They were concerned whether pt should be taking all 3 medications.  Spoke with Dr. Julianne Handler, pt can stop taking Plavix 75mg  while on Eliquis.  Pt and spouse v/u of instructions.  Med list updated.

## 2019-06-22 NOTE — Telephone Encounter (Signed)
Agree. Thanks

## 2019-06-23 DIAGNOSIS — N186 End stage renal disease: Secondary | ICD-10-CM | POA: Diagnosis not present

## 2019-06-23 DIAGNOSIS — Z992 Dependence on renal dialysis: Secondary | ICD-10-CM | POA: Diagnosis not present

## 2019-06-26 DIAGNOSIS — N186 End stage renal disease: Secondary | ICD-10-CM | POA: Diagnosis not present

## 2019-06-26 DIAGNOSIS — Z992 Dependence on renal dialysis: Secondary | ICD-10-CM | POA: Diagnosis not present

## 2019-06-27 DIAGNOSIS — I10 Essential (primary) hypertension: Secondary | ICD-10-CM | POA: Diagnosis not present

## 2019-06-27 DIAGNOSIS — I82409 Acute embolism and thrombosis of unspecified deep veins of unspecified lower extremity: Secondary | ICD-10-CM | POA: Diagnosis not present

## 2019-06-27 DIAGNOSIS — N189 Chronic kidney disease, unspecified: Secondary | ICD-10-CM | POA: Diagnosis not present

## 2019-06-27 DIAGNOSIS — I2510 Atherosclerotic heart disease of native coronary artery without angina pectoris: Secondary | ICD-10-CM | POA: Diagnosis not present

## 2019-06-28 DIAGNOSIS — N186 End stage renal disease: Secondary | ICD-10-CM | POA: Diagnosis not present

## 2019-06-28 DIAGNOSIS — Z992 Dependence on renal dialysis: Secondary | ICD-10-CM | POA: Diagnosis not present

## 2019-06-29 DIAGNOSIS — N186 End stage renal disease: Secondary | ICD-10-CM | POA: Diagnosis not present

## 2019-06-29 DIAGNOSIS — I10 Essential (primary) hypertension: Secondary | ICD-10-CM | POA: Diagnosis not present

## 2019-06-29 DIAGNOSIS — I739 Peripheral vascular disease, unspecified: Secondary | ICD-10-CM | POA: Diagnosis not present

## 2019-06-29 DIAGNOSIS — I82409 Acute embolism and thrombosis of unspecified deep veins of unspecified lower extremity: Secondary | ICD-10-CM | POA: Diagnosis not present

## 2019-06-30 DIAGNOSIS — N186 End stage renal disease: Secondary | ICD-10-CM | POA: Diagnosis not present

## 2019-06-30 DIAGNOSIS — Z992 Dependence on renal dialysis: Secondary | ICD-10-CM | POA: Diagnosis not present

## 2019-07-03 DIAGNOSIS — Z992 Dependence on renal dialysis: Secondary | ICD-10-CM | POA: Diagnosis not present

## 2019-07-03 DIAGNOSIS — N186 End stage renal disease: Secondary | ICD-10-CM | POA: Diagnosis not present

## 2019-07-05 DIAGNOSIS — N186 End stage renal disease: Secondary | ICD-10-CM | POA: Diagnosis not present

## 2019-07-05 DIAGNOSIS — Z992 Dependence on renal dialysis: Secondary | ICD-10-CM | POA: Diagnosis not present

## 2019-07-06 DIAGNOSIS — M79604 Pain in right leg: Secondary | ICD-10-CM | POA: Diagnosis not present

## 2019-07-06 DIAGNOSIS — N189 Chronic kidney disease, unspecified: Secondary | ICD-10-CM | POA: Diagnosis not present

## 2019-07-06 DIAGNOSIS — I82409 Acute embolism and thrombosis of unspecified deep veins of unspecified lower extremity: Secondary | ICD-10-CM | POA: Diagnosis not present

## 2019-07-06 DIAGNOSIS — I2510 Atherosclerotic heart disease of native coronary artery without angina pectoris: Secondary | ICD-10-CM | POA: Diagnosis not present

## 2019-07-06 DIAGNOSIS — I10 Essential (primary) hypertension: Secondary | ICD-10-CM | POA: Diagnosis not present

## 2019-07-06 DIAGNOSIS — K219 Gastro-esophageal reflux disease without esophagitis: Secondary | ICD-10-CM | POA: Diagnosis not present

## 2019-07-06 DIAGNOSIS — J449 Chronic obstructive pulmonary disease, unspecified: Secondary | ICD-10-CM | POA: Diagnosis not present

## 2019-07-06 DIAGNOSIS — I739 Peripheral vascular disease, unspecified: Secondary | ICD-10-CM | POA: Diagnosis not present

## 2019-07-06 DIAGNOSIS — I959 Hypotension, unspecified: Secondary | ICD-10-CM | POA: Diagnosis not present

## 2019-07-06 DIAGNOSIS — E782 Mixed hyperlipidemia: Secondary | ICD-10-CM | POA: Diagnosis not present

## 2019-07-07 DIAGNOSIS — N186 End stage renal disease: Secondary | ICD-10-CM | POA: Diagnosis not present

## 2019-07-07 DIAGNOSIS — Z992 Dependence on renal dialysis: Secondary | ICD-10-CM | POA: Diagnosis not present

## 2019-07-10 DIAGNOSIS — Z992 Dependence on renal dialysis: Secondary | ICD-10-CM | POA: Diagnosis not present

## 2019-07-10 DIAGNOSIS — N186 End stage renal disease: Secondary | ICD-10-CM | POA: Diagnosis not present

## 2019-07-10 DIAGNOSIS — L0390 Cellulitis, unspecified: Secondary | ICD-10-CM | POA: Diagnosis not present

## 2019-07-10 DIAGNOSIS — I82409 Acute embolism and thrombosis of unspecified deep veins of unspecified lower extremity: Secondary | ICD-10-CM | POA: Diagnosis not present

## 2019-07-10 DIAGNOSIS — I2510 Atherosclerotic heart disease of native coronary artery without angina pectoris: Secondary | ICD-10-CM | POA: Diagnosis not present

## 2019-07-10 DIAGNOSIS — I10 Essential (primary) hypertension: Secondary | ICD-10-CM | POA: Diagnosis not present

## 2019-07-11 DIAGNOSIS — M7989 Other specified soft tissue disorders: Secondary | ICD-10-CM | POA: Diagnosis not present

## 2019-07-11 DIAGNOSIS — M79604 Pain in right leg: Secondary | ICD-10-CM | POA: Diagnosis not present

## 2019-07-12 ENCOUNTER — Telehealth: Payer: Self-pay | Admitting: Internal Medicine

## 2019-07-12 DIAGNOSIS — N186 End stage renal disease: Secondary | ICD-10-CM | POA: Diagnosis not present

## 2019-07-12 DIAGNOSIS — I5031 Acute diastolic (congestive) heart failure: Secondary | ICD-10-CM | POA: Diagnosis not present

## 2019-07-12 DIAGNOSIS — J449 Chronic obstructive pulmonary disease, unspecified: Secondary | ICD-10-CM | POA: Diagnosis not present

## 2019-07-12 DIAGNOSIS — Z992 Dependence on renal dialysis: Secondary | ICD-10-CM | POA: Diagnosis not present

## 2019-07-12 DIAGNOSIS — L0390 Cellulitis, unspecified: Secondary | ICD-10-CM | POA: Diagnosis not present

## 2019-07-12 MED ORDER — ALBUTEROL SULFATE HFA 108 (90 BASE) MCG/ACT IN AERS: 2.0000 | INHALATION_SPRAY | RESPIRATORY_TRACT | 2 refills | Status: AC | PRN

## 2019-07-12 NOTE — Telephone Encounter (Signed)
Rx for ventolin was refilled  I spoke with the pt's spouse and notified that this was done  Nothing further needed

## 2019-07-13 DIAGNOSIS — I639 Cerebral infarction, unspecified: Secondary | ICD-10-CM | POA: Diagnosis not present

## 2019-07-13 DIAGNOSIS — Z992 Dependence on renal dialysis: Secondary | ICD-10-CM | POA: Diagnosis not present

## 2019-07-13 DIAGNOSIS — N186 End stage renal disease: Secondary | ICD-10-CM | POA: Diagnosis not present

## 2019-07-13 DIAGNOSIS — I739 Peripheral vascular disease, unspecified: Secondary | ICD-10-CM | POA: Diagnosis not present

## 2019-07-14 DIAGNOSIS — N186 End stage renal disease: Secondary | ICD-10-CM | POA: Diagnosis not present

## 2019-07-14 DIAGNOSIS — Z992 Dependence on renal dialysis: Secondary | ICD-10-CM | POA: Diagnosis not present

## 2019-07-17 DIAGNOSIS — N186 End stage renal disease: Secondary | ICD-10-CM | POA: Diagnosis not present

## 2019-07-17 DIAGNOSIS — Z992 Dependence on renal dialysis: Secondary | ICD-10-CM | POA: Diagnosis not present

## 2019-07-18 DIAGNOSIS — L89619 Pressure ulcer of right heel, unspecified stage: Secondary | ICD-10-CM | POA: Diagnosis not present

## 2019-07-18 DIAGNOSIS — L97411 Non-pressure chronic ulcer of right heel and midfoot limited to breakdown of skin: Secondary | ICD-10-CM | POA: Diagnosis not present

## 2019-07-18 DIAGNOSIS — I890 Lymphedema, not elsewhere classified: Secondary | ICD-10-CM | POA: Diagnosis not present

## 2019-07-19 DIAGNOSIS — Z992 Dependence on renal dialysis: Secondary | ICD-10-CM | POA: Diagnosis not present

## 2019-07-19 DIAGNOSIS — N186 End stage renal disease: Secondary | ICD-10-CM | POA: Diagnosis not present

## 2019-07-21 DIAGNOSIS — N186 End stage renal disease: Secondary | ICD-10-CM | POA: Diagnosis not present

## 2019-07-21 DIAGNOSIS — Z992 Dependence on renal dialysis: Secondary | ICD-10-CM | POA: Diagnosis not present

## 2019-07-22 DIAGNOSIS — I739 Peripheral vascular disease, unspecified: Secondary | ICD-10-CM | POA: Diagnosis not present

## 2019-07-22 DIAGNOSIS — I132 Hypertensive heart and chronic kidney disease with heart failure and with stage 5 chronic kidney disease, or end stage renal disease: Secondary | ICD-10-CM | POA: Diagnosis not present

## 2019-07-22 DIAGNOSIS — I509 Heart failure, unspecified: Secondary | ICD-10-CM | POA: Diagnosis not present

## 2019-07-22 DIAGNOSIS — L97411 Non-pressure chronic ulcer of right heel and midfoot limited to breakdown of skin: Secondary | ICD-10-CM | POA: Diagnosis not present

## 2019-07-22 DIAGNOSIS — I890 Lymphedema, not elsewhere classified: Secondary | ICD-10-CM | POA: Diagnosis not present

## 2019-07-22 DIAGNOSIS — K219 Gastro-esophageal reflux disease without esophagitis: Secondary | ICD-10-CM | POA: Diagnosis not present

## 2019-07-22 DIAGNOSIS — J439 Emphysema, unspecified: Secondary | ICD-10-CM | POA: Diagnosis not present

## 2019-07-22 DIAGNOSIS — N186 End stage renal disease: Secondary | ICD-10-CM | POA: Diagnosis not present

## 2019-07-22 DIAGNOSIS — E782 Mixed hyperlipidemia: Secondary | ICD-10-CM | POA: Diagnosis not present

## 2019-07-24 DIAGNOSIS — N186 End stage renal disease: Secondary | ICD-10-CM | POA: Diagnosis not present

## 2019-07-24 DIAGNOSIS — Z992 Dependence on renal dialysis: Secondary | ICD-10-CM | POA: Diagnosis not present

## 2019-07-25 DIAGNOSIS — I890 Lymphedema, not elsewhere classified: Secondary | ICD-10-CM | POA: Diagnosis not present

## 2019-07-25 DIAGNOSIS — L97411 Non-pressure chronic ulcer of right heel and midfoot limited to breakdown of skin: Secondary | ICD-10-CM | POA: Diagnosis not present

## 2019-07-25 DIAGNOSIS — L89619 Pressure ulcer of right heel, unspecified stage: Secondary | ICD-10-CM | POA: Diagnosis not present

## 2019-07-26 DIAGNOSIS — N186 End stage renal disease: Secondary | ICD-10-CM | POA: Diagnosis not present

## 2019-07-26 DIAGNOSIS — Z992 Dependence on renal dialysis: Secondary | ICD-10-CM | POA: Diagnosis not present

## 2019-07-27 DIAGNOSIS — N186 End stage renal disease: Secondary | ICD-10-CM | POA: Diagnosis not present

## 2019-07-27 DIAGNOSIS — I132 Hypertensive heart and chronic kidney disease with heart failure and with stage 5 chronic kidney disease, or end stage renal disease: Secondary | ICD-10-CM | POA: Diagnosis not present

## 2019-07-27 DIAGNOSIS — I509 Heart failure, unspecified: Secondary | ICD-10-CM | POA: Diagnosis not present

## 2019-07-27 DIAGNOSIS — L97411 Non-pressure chronic ulcer of right heel and midfoot limited to breakdown of skin: Secondary | ICD-10-CM | POA: Diagnosis not present

## 2019-07-27 DIAGNOSIS — K219 Gastro-esophageal reflux disease without esophagitis: Secondary | ICD-10-CM | POA: Diagnosis not present

## 2019-07-27 DIAGNOSIS — I739 Peripheral vascular disease, unspecified: Secondary | ICD-10-CM | POA: Diagnosis not present

## 2019-07-27 DIAGNOSIS — E782 Mixed hyperlipidemia: Secondary | ICD-10-CM | POA: Diagnosis not present

## 2019-07-27 DIAGNOSIS — I890 Lymphedema, not elsewhere classified: Secondary | ICD-10-CM | POA: Diagnosis not present

## 2019-07-27 DIAGNOSIS — I10 Essential (primary) hypertension: Secondary | ICD-10-CM | POA: Diagnosis not present

## 2019-07-27 DIAGNOSIS — J439 Emphysema, unspecified: Secondary | ICD-10-CM | POA: Diagnosis not present

## 2019-07-27 DIAGNOSIS — Z992 Dependence on renal dialysis: Secondary | ICD-10-CM | POA: Diagnosis not present

## 2019-07-28 DIAGNOSIS — N186 End stage renal disease: Secondary | ICD-10-CM | POA: Diagnosis not present

## 2019-07-28 DIAGNOSIS — Z992 Dependence on renal dialysis: Secondary | ICD-10-CM | POA: Diagnosis not present

## 2019-07-30 DIAGNOSIS — I890 Lymphedema, not elsewhere classified: Secondary | ICD-10-CM | POA: Diagnosis not present

## 2019-07-30 DIAGNOSIS — I132 Hypertensive heart and chronic kidney disease with heart failure and with stage 5 chronic kidney disease, or end stage renal disease: Secondary | ICD-10-CM | POA: Diagnosis not present

## 2019-07-30 DIAGNOSIS — N186 End stage renal disease: Secondary | ICD-10-CM | POA: Diagnosis not present

## 2019-07-30 DIAGNOSIS — I739 Peripheral vascular disease, unspecified: Secondary | ICD-10-CM | POA: Diagnosis not present

## 2019-07-30 DIAGNOSIS — L97411 Non-pressure chronic ulcer of right heel and midfoot limited to breakdown of skin: Secondary | ICD-10-CM | POA: Diagnosis not present

## 2019-07-30 DIAGNOSIS — E782 Mixed hyperlipidemia: Secondary | ICD-10-CM | POA: Diagnosis not present

## 2019-07-30 DIAGNOSIS — K219 Gastro-esophageal reflux disease without esophagitis: Secondary | ICD-10-CM | POA: Diagnosis not present

## 2019-07-30 DIAGNOSIS — J439 Emphysema, unspecified: Secondary | ICD-10-CM | POA: Diagnosis not present

## 2019-07-30 DIAGNOSIS — I509 Heart failure, unspecified: Secondary | ICD-10-CM | POA: Diagnosis not present

## 2019-07-31 DIAGNOSIS — N186 End stage renal disease: Secondary | ICD-10-CM | POA: Diagnosis not present

## 2019-07-31 DIAGNOSIS — Z992 Dependence on renal dialysis: Secondary | ICD-10-CM | POA: Diagnosis not present

## 2019-08-01 ENCOUNTER — Other Ambulatory Visit: Payer: Self-pay | Admitting: Internal Medicine

## 2019-08-01 DIAGNOSIS — L89619 Pressure ulcer of right heel, unspecified stage: Secondary | ICD-10-CM | POA: Diagnosis not present

## 2019-08-01 DIAGNOSIS — I890 Lymphedema, not elsewhere classified: Secondary | ICD-10-CM | POA: Diagnosis not present

## 2019-08-01 DIAGNOSIS — L97411 Non-pressure chronic ulcer of right heel and midfoot limited to breakdown of skin: Secondary | ICD-10-CM | POA: Diagnosis not present

## 2019-08-02 DIAGNOSIS — N186 End stage renal disease: Secondary | ICD-10-CM | POA: Diagnosis not present

## 2019-08-02 DIAGNOSIS — K5790 Diverticulosis of intestine, part unspecified, without perforation or abscess without bleeding: Secondary | ICD-10-CM | POA: Diagnosis not present

## 2019-08-02 DIAGNOSIS — D649 Anemia, unspecified: Secondary | ICD-10-CM | POA: Diagnosis not present

## 2019-08-02 DIAGNOSIS — Z992 Dependence on renal dialysis: Secondary | ICD-10-CM | POA: Diagnosis not present

## 2019-08-02 DIAGNOSIS — K625 Hemorrhage of anus and rectum: Secondary | ICD-10-CM | POA: Diagnosis not present

## 2019-08-03 DIAGNOSIS — I890 Lymphedema, not elsewhere classified: Secondary | ICD-10-CM | POA: Diagnosis not present

## 2019-08-03 DIAGNOSIS — I509 Heart failure, unspecified: Secondary | ICD-10-CM | POA: Diagnosis not present

## 2019-08-03 DIAGNOSIS — N186 End stage renal disease: Secondary | ICD-10-CM | POA: Diagnosis not present

## 2019-08-03 DIAGNOSIS — J439 Emphysema, unspecified: Secondary | ICD-10-CM | POA: Diagnosis not present

## 2019-08-03 DIAGNOSIS — I739 Peripheral vascular disease, unspecified: Secondary | ICD-10-CM | POA: Diagnosis not present

## 2019-08-03 DIAGNOSIS — K219 Gastro-esophageal reflux disease without esophagitis: Secondary | ICD-10-CM | POA: Diagnosis not present

## 2019-08-03 DIAGNOSIS — I132 Hypertensive heart and chronic kidney disease with heart failure and with stage 5 chronic kidney disease, or end stage renal disease: Secondary | ICD-10-CM | POA: Diagnosis not present

## 2019-08-03 DIAGNOSIS — L97411 Non-pressure chronic ulcer of right heel and midfoot limited to breakdown of skin: Secondary | ICD-10-CM | POA: Diagnosis not present

## 2019-08-03 DIAGNOSIS — E782 Mixed hyperlipidemia: Secondary | ICD-10-CM | POA: Diagnosis not present

## 2019-08-04 ENCOUNTER — Telehealth: Payer: Self-pay | Admitting: Cardiovascular Disease

## 2019-08-04 DIAGNOSIS — E1122 Type 2 diabetes mellitus with diabetic chronic kidney disease: Secondary | ICD-10-CM | POA: Diagnosis not present

## 2019-08-04 DIAGNOSIS — J449 Chronic obstructive pulmonary disease, unspecified: Secondary | ICD-10-CM | POA: Diagnosis not present

## 2019-08-04 DIAGNOSIS — N186 End stage renal disease: Secondary | ICD-10-CM | POA: Diagnosis not present

## 2019-08-04 DIAGNOSIS — I120 Hypertensive chronic kidney disease with stage 5 chronic kidney disease or end stage renal disease: Secondary | ICD-10-CM | POA: Diagnosis not present

## 2019-08-04 DIAGNOSIS — Z992 Dependence on renal dialysis: Secondary | ICD-10-CM | POA: Diagnosis not present

## 2019-08-04 DIAGNOSIS — E785 Hyperlipidemia, unspecified: Secondary | ICD-10-CM | POA: Diagnosis not present

## 2019-08-04 DIAGNOSIS — E1151 Type 2 diabetes mellitus with diabetic peripheral angiopathy without gangrene: Secondary | ICD-10-CM | POA: Diagnosis not present

## 2019-08-04 DIAGNOSIS — D649 Anemia, unspecified: Secondary | ICD-10-CM | POA: Diagnosis not present

## 2019-08-04 DIAGNOSIS — Z7901 Long term (current) use of anticoagulants: Secondary | ICD-10-CM | POA: Diagnosis not present

## 2019-08-04 NOTE — Telephone Encounter (Signed)
Pt c/o medication issue:  1. Name of Medication:   apixaban (ELIQUIS) 2.5 MG TABS tablet     2. How are you currently taking this medication (dosage and times per day)? 2.5 mg by mouth 2 (two) times daily  3. Are you having a reaction (difficulty breathing--STAT)? No  4. What is your medication issue? Patient's wife states that the medication is causing the patient to have internal bleeding. Patient's hemoglobin has gotten down to 7.7 and the patient has also had blood in his stool. Please call to discuss.

## 2019-08-04 NOTE — Telephone Encounter (Signed)
Pt developed DVT and was started on Eliquis 12/31 (Dr. Georgina Quint in [place]).  Called to cardiology and was instructed to stop Plavix while on Eliquis.  Continues Eliquis and aspirin now. H and H trending down over last few weeks, 7.4 on 2/10, repeat today at dialysis is 7.7. Leaving dialysis and going to [facility] for transfusion.  Has had heme+stool. GI plans colonoscopy later in Feb. Last [place] did not show DVT but Dr. Georgina Quint wanted him to stay on Eliquis.  Remains on Eliquis and aspirin  TAVR waS 04/18/19. He has hx afib, CAD. I adv that they need to speak with the doctor that prescribed Eliquis and the GI doctor re: those issues.  Adv her to give office phone number to those doctors so they can call if there is a need for input from Dr. [name].

## 2019-08-04 NOTE — Telephone Encounter (Signed)
Agree. Thanks

## 2019-08-06 DIAGNOSIS — I890 Lymphedema, not elsewhere classified: Secondary | ICD-10-CM | POA: Diagnosis not present

## 2019-08-06 DIAGNOSIS — I132 Hypertensive heart and chronic kidney disease with heart failure and with stage 5 chronic kidney disease, or end stage renal disease: Secondary | ICD-10-CM | POA: Diagnosis not present

## 2019-08-06 DIAGNOSIS — E782 Mixed hyperlipidemia: Secondary | ICD-10-CM | POA: Diagnosis not present

## 2019-08-06 DIAGNOSIS — N186 End stage renal disease: Secondary | ICD-10-CM | POA: Diagnosis not present

## 2019-08-06 DIAGNOSIS — K219 Gastro-esophageal reflux disease without esophagitis: Secondary | ICD-10-CM | POA: Diagnosis not present

## 2019-08-06 DIAGNOSIS — I509 Heart failure, unspecified: Secondary | ICD-10-CM | POA: Diagnosis not present

## 2019-08-06 DIAGNOSIS — L97411 Non-pressure chronic ulcer of right heel and midfoot limited to breakdown of skin: Secondary | ICD-10-CM | POA: Diagnosis not present

## 2019-08-06 DIAGNOSIS — J439 Emphysema, unspecified: Secondary | ICD-10-CM | POA: Diagnosis not present

## 2019-08-06 DIAGNOSIS — I739 Peripheral vascular disease, unspecified: Secondary | ICD-10-CM | POA: Diagnosis not present

## 2019-08-07 DIAGNOSIS — N186 End stage renal disease: Secondary | ICD-10-CM | POA: Diagnosis not present

## 2019-08-07 DIAGNOSIS — Z992 Dependence on renal dialysis: Secondary | ICD-10-CM | POA: Diagnosis not present

## 2019-08-08 DIAGNOSIS — I890 Lymphedema, not elsewhere classified: Secondary | ICD-10-CM | POA: Diagnosis not present

## 2019-08-08 DIAGNOSIS — L89619 Pressure ulcer of right heel, unspecified stage: Secondary | ICD-10-CM | POA: Diagnosis not present

## 2019-08-08 DIAGNOSIS — L97411 Non-pressure chronic ulcer of right heel and midfoot limited to breakdown of skin: Secondary | ICD-10-CM | POA: Diagnosis not present

## 2019-08-09 DIAGNOSIS — N186 End stage renal disease: Secondary | ICD-10-CM | POA: Diagnosis not present

## 2019-08-09 DIAGNOSIS — Z992 Dependence on renal dialysis: Secondary | ICD-10-CM | POA: Diagnosis not present

## 2019-08-10 DIAGNOSIS — J439 Emphysema, unspecified: Secondary | ICD-10-CM | POA: Diagnosis not present

## 2019-08-10 DIAGNOSIS — K219 Gastro-esophageal reflux disease without esophagitis: Secondary | ICD-10-CM | POA: Diagnosis not present

## 2019-08-10 DIAGNOSIS — L97411 Non-pressure chronic ulcer of right heel and midfoot limited to breakdown of skin: Secondary | ICD-10-CM | POA: Diagnosis not present

## 2019-08-10 DIAGNOSIS — E782 Mixed hyperlipidemia: Secondary | ICD-10-CM | POA: Diagnosis not present

## 2019-08-10 DIAGNOSIS — I132 Hypertensive heart and chronic kidney disease with heart failure and with stage 5 chronic kidney disease, or end stage renal disease: Secondary | ICD-10-CM | POA: Diagnosis not present

## 2019-08-10 DIAGNOSIS — N186 End stage renal disease: Secondary | ICD-10-CM | POA: Diagnosis not present

## 2019-08-10 DIAGNOSIS — I739 Peripheral vascular disease, unspecified: Secondary | ICD-10-CM | POA: Diagnosis not present

## 2019-08-10 DIAGNOSIS — I890 Lymphedema, not elsewhere classified: Secondary | ICD-10-CM | POA: Diagnosis not present

## 2019-08-10 DIAGNOSIS — I509 Heart failure, unspecified: Secondary | ICD-10-CM | POA: Diagnosis not present

## 2019-08-11 DIAGNOSIS — Z992 Dependence on renal dialysis: Secondary | ICD-10-CM | POA: Diagnosis not present

## 2019-08-11 DIAGNOSIS — N186 End stage renal disease: Secondary | ICD-10-CM | POA: Diagnosis not present

## 2019-08-12 DIAGNOSIS — I5031 Acute diastolic (congestive) heart failure: Secondary | ICD-10-CM | POA: Diagnosis not present

## 2019-08-12 DIAGNOSIS — L0390 Cellulitis, unspecified: Secondary | ICD-10-CM | POA: Diagnosis not present

## 2019-08-12 DIAGNOSIS — J449 Chronic obstructive pulmonary disease, unspecified: Secondary | ICD-10-CM | POA: Diagnosis not present

## 2019-08-12 DIAGNOSIS — Z992 Dependence on renal dialysis: Secondary | ICD-10-CM | POA: Diagnosis not present

## 2019-08-13 DIAGNOSIS — J439 Emphysema, unspecified: Secondary | ICD-10-CM | POA: Diagnosis not present

## 2019-08-13 DIAGNOSIS — N186 End stage renal disease: Secondary | ICD-10-CM | POA: Diagnosis not present

## 2019-08-13 DIAGNOSIS — L97411 Non-pressure chronic ulcer of right heel and midfoot limited to breakdown of skin: Secondary | ICD-10-CM | POA: Diagnosis not present

## 2019-08-13 DIAGNOSIS — K219 Gastro-esophageal reflux disease without esophagitis: Secondary | ICD-10-CM | POA: Diagnosis not present

## 2019-08-13 DIAGNOSIS — I890 Lymphedema, not elsewhere classified: Secondary | ICD-10-CM | POA: Diagnosis not present

## 2019-08-13 DIAGNOSIS — I132 Hypertensive heart and chronic kidney disease with heart failure and with stage 5 chronic kidney disease, or end stage renal disease: Secondary | ICD-10-CM | POA: Diagnosis not present

## 2019-08-13 DIAGNOSIS — E782 Mixed hyperlipidemia: Secondary | ICD-10-CM | POA: Diagnosis not present

## 2019-08-13 DIAGNOSIS — I509 Heart failure, unspecified: Secondary | ICD-10-CM | POA: Diagnosis not present

## 2019-08-13 DIAGNOSIS — I739 Peripheral vascular disease, unspecified: Secondary | ICD-10-CM | POA: Diagnosis not present

## 2019-08-14 ENCOUNTER — Other Ambulatory Visit: Payer: Self-pay | Admitting: Internal Medicine

## 2019-08-14 DIAGNOSIS — Z992 Dependence on renal dialysis: Secondary | ICD-10-CM | POA: Diagnosis not present

## 2019-08-14 DIAGNOSIS — N186 End stage renal disease: Secondary | ICD-10-CM | POA: Diagnosis not present

## 2019-08-15 DIAGNOSIS — L97411 Non-pressure chronic ulcer of right heel and midfoot limited to breakdown of skin: Secondary | ICD-10-CM | POA: Diagnosis not present

## 2019-08-15 DIAGNOSIS — S81811A Laceration without foreign body, right lower leg, initial encounter: Secondary | ICD-10-CM | POA: Diagnosis not present

## 2019-08-15 DIAGNOSIS — I890 Lymphedema, not elsewhere classified: Secondary | ICD-10-CM | POA: Diagnosis not present

## 2019-08-15 DIAGNOSIS — L89619 Pressure ulcer of right heel, unspecified stage: Secondary | ICD-10-CM | POA: Diagnosis not present

## 2019-08-15 DIAGNOSIS — S50811A Abrasion of right forearm, initial encounter: Secondary | ICD-10-CM | POA: Diagnosis not present

## 2019-08-16 DIAGNOSIS — N186 End stage renal disease: Secondary | ICD-10-CM | POA: Diagnosis not present

## 2019-08-16 DIAGNOSIS — Z992 Dependence on renal dialysis: Secondary | ICD-10-CM | POA: Diagnosis not present

## 2019-08-17 DIAGNOSIS — I739 Peripheral vascular disease, unspecified: Secondary | ICD-10-CM | POA: Diagnosis not present

## 2019-08-17 DIAGNOSIS — K921 Melena: Secondary | ICD-10-CM | POA: Diagnosis not present

## 2019-08-17 DIAGNOSIS — Z9981 Dependence on supplemental oxygen: Secondary | ICD-10-CM | POA: Diagnosis not present

## 2019-08-17 DIAGNOSIS — I10 Essential (primary) hypertension: Secondary | ICD-10-CM | POA: Diagnosis not present

## 2019-08-17 DIAGNOSIS — K625 Hemorrhage of anus and rectum: Secondary | ICD-10-CM | POA: Diagnosis not present

## 2019-08-17 DIAGNOSIS — N186 End stage renal disease: Secondary | ICD-10-CM | POA: Diagnosis not present

## 2019-08-17 DIAGNOSIS — I2510 Atherosclerotic heart disease of native coronary artery without angina pectoris: Secondary | ICD-10-CM | POA: Diagnosis not present

## 2019-08-17 DIAGNOSIS — K5730 Diverticulosis of large intestine without perforation or abscess without bleeding: Secondary | ICD-10-CM | POA: Diagnosis not present

## 2019-08-17 DIAGNOSIS — K3182 Dieulafoy lesion (hemorrhagic) of stomach and duodenum: Secondary | ICD-10-CM | POA: Diagnosis not present

## 2019-08-17 DIAGNOSIS — D62 Acute posthemorrhagic anemia: Secondary | ICD-10-CM | POA: Diagnosis not present

## 2019-08-17 DIAGNOSIS — Z992 Dependence on renal dialysis: Secondary | ICD-10-CM | POA: Diagnosis not present

## 2019-08-17 DIAGNOSIS — D649 Anemia, unspecified: Secondary | ICD-10-CM | POA: Diagnosis not present

## 2019-08-17 DIAGNOSIS — E1122 Type 2 diabetes mellitus with diabetic chronic kidney disease: Secondary | ICD-10-CM | POA: Diagnosis not present

## 2019-08-17 DIAGNOSIS — E785 Hyperlipidemia, unspecified: Secondary | ICD-10-CM | POA: Diagnosis not present

## 2019-08-17 DIAGNOSIS — I120 Hypertensive chronic kidney disease with stage 5 chronic kidney disease or end stage renal disease: Secondary | ICD-10-CM | POA: Diagnosis not present

## 2019-08-17 DIAGNOSIS — J449 Chronic obstructive pulmonary disease, unspecified: Secondary | ICD-10-CM | POA: Diagnosis not present

## 2019-08-17 DIAGNOSIS — D631 Anemia in chronic kidney disease: Secondary | ICD-10-CM | POA: Diagnosis not present

## 2019-08-18 DIAGNOSIS — K3182 Dieulafoy lesion (hemorrhagic) of stomach and duodenum: Secondary | ICD-10-CM | POA: Diagnosis not present

## 2019-08-18 DIAGNOSIS — I2510 Atherosclerotic heart disease of native coronary artery without angina pectoris: Secondary | ICD-10-CM | POA: Diagnosis not present

## 2019-08-18 DIAGNOSIS — E1122 Type 2 diabetes mellitus with diabetic chronic kidney disease: Secondary | ICD-10-CM | POA: Diagnosis not present

## 2019-08-18 DIAGNOSIS — I739 Peripheral vascular disease, unspecified: Secondary | ICD-10-CM | POA: Diagnosis not present

## 2019-08-18 DIAGNOSIS — J449 Chronic obstructive pulmonary disease, unspecified: Secondary | ICD-10-CM | POA: Diagnosis not present

## 2019-08-18 DIAGNOSIS — Z9981 Dependence on supplemental oxygen: Secondary | ICD-10-CM | POA: Diagnosis not present

## 2019-08-18 DIAGNOSIS — Z992 Dependence on renal dialysis: Secondary | ICD-10-CM | POA: Diagnosis not present

## 2019-08-18 DIAGNOSIS — K921 Melena: Secondary | ICD-10-CM | POA: Diagnosis not present

## 2019-08-18 DIAGNOSIS — E785 Hyperlipidemia, unspecified: Secondary | ICD-10-CM | POA: Diagnosis not present

## 2019-08-18 DIAGNOSIS — D62 Acute posthemorrhagic anemia: Secondary | ICD-10-CM | POA: Diagnosis not present

## 2019-08-18 DIAGNOSIS — I10 Essential (primary) hypertension: Secondary | ICD-10-CM | POA: Diagnosis not present

## 2019-08-18 DIAGNOSIS — N186 End stage renal disease: Secondary | ICD-10-CM | POA: Diagnosis not present

## 2019-08-18 DIAGNOSIS — D631 Anemia in chronic kidney disease: Secondary | ICD-10-CM | POA: Diagnosis not present

## 2019-08-18 DIAGNOSIS — I120 Hypertensive chronic kidney disease with stage 5 chronic kidney disease or end stage renal disease: Secondary | ICD-10-CM | POA: Diagnosis not present

## 2019-08-20 DIAGNOSIS — I890 Lymphedema, not elsewhere classified: Secondary | ICD-10-CM | POA: Diagnosis not present

## 2019-08-20 DIAGNOSIS — N186 End stage renal disease: Secondary | ICD-10-CM | POA: Diagnosis not present

## 2019-08-20 DIAGNOSIS — I509 Heart failure, unspecified: Secondary | ICD-10-CM | POA: Diagnosis not present

## 2019-08-20 DIAGNOSIS — I739 Peripheral vascular disease, unspecified: Secondary | ICD-10-CM | POA: Diagnosis not present

## 2019-08-20 DIAGNOSIS — L97411 Non-pressure chronic ulcer of right heel and midfoot limited to breakdown of skin: Secondary | ICD-10-CM | POA: Diagnosis not present

## 2019-08-20 DIAGNOSIS — J439 Emphysema, unspecified: Secondary | ICD-10-CM | POA: Diagnosis not present

## 2019-08-20 DIAGNOSIS — E782 Mixed hyperlipidemia: Secondary | ICD-10-CM | POA: Diagnosis not present

## 2019-08-20 DIAGNOSIS — K219 Gastro-esophageal reflux disease without esophagitis: Secondary | ICD-10-CM | POA: Diagnosis not present

## 2019-08-20 DIAGNOSIS — I132 Hypertensive heart and chronic kidney disease with heart failure and with stage 5 chronic kidney disease, or end stage renal disease: Secondary | ICD-10-CM | POA: Diagnosis not present

## 2019-08-21 DIAGNOSIS — N186 End stage renal disease: Secondary | ICD-10-CM | POA: Diagnosis not present

## 2019-08-21 DIAGNOSIS — Z992 Dependence on renal dialysis: Secondary | ICD-10-CM | POA: Diagnosis not present

## 2019-08-22 DIAGNOSIS — L97411 Non-pressure chronic ulcer of right heel and midfoot limited to breakdown of skin: Secondary | ICD-10-CM | POA: Diagnosis not present

## 2019-08-22 DIAGNOSIS — L98491 Non-pressure chronic ulcer of skin of other sites limited to breakdown of skin: Secondary | ICD-10-CM | POA: Diagnosis not present

## 2019-08-22 DIAGNOSIS — I890 Lymphedema, not elsewhere classified: Secondary | ICD-10-CM | POA: Diagnosis not present

## 2019-08-22 DIAGNOSIS — L89612 Pressure ulcer of right heel, stage 2: Secondary | ICD-10-CM | POA: Diagnosis not present

## 2019-08-23 DIAGNOSIS — N186 End stage renal disease: Secondary | ICD-10-CM | POA: Diagnosis not present

## 2019-08-23 DIAGNOSIS — Z992 Dependence on renal dialysis: Secondary | ICD-10-CM | POA: Diagnosis not present

## 2019-08-24 DIAGNOSIS — I82409 Acute embolism and thrombosis of unspecified deep veins of unspecified lower extremity: Secondary | ICD-10-CM | POA: Diagnosis not present

## 2019-08-24 DIAGNOSIS — I509 Heart failure, unspecified: Secondary | ICD-10-CM | POA: Diagnosis not present

## 2019-08-24 DIAGNOSIS — R1310 Dysphagia, unspecified: Secondary | ICD-10-CM | POA: Diagnosis not present

## 2019-08-24 DIAGNOSIS — N186 End stage renal disease: Secondary | ICD-10-CM | POA: Diagnosis not present

## 2019-08-24 DIAGNOSIS — E1122 Type 2 diabetes mellitus with diabetic chronic kidney disease: Secondary | ICD-10-CM | POA: Diagnosis not present

## 2019-08-24 DIAGNOSIS — I350 Nonrheumatic aortic (valve) stenosis: Secondary | ICD-10-CM | POA: Diagnosis not present

## 2019-08-24 DIAGNOSIS — I739 Peripheral vascular disease, unspecified: Secondary | ICD-10-CM | POA: Diagnosis not present

## 2019-08-24 DIAGNOSIS — E785 Hyperlipidemia, unspecified: Secondary | ICD-10-CM | POA: Diagnosis not present

## 2019-08-24 DIAGNOSIS — E8770 Fluid overload, unspecified: Secondary | ICD-10-CM | POA: Diagnosis not present

## 2019-08-24 DIAGNOSIS — I10 Essential (primary) hypertension: Secondary | ICD-10-CM | POA: Diagnosis not present

## 2019-08-24 DIAGNOSIS — S81801A Unspecified open wound, right lower leg, initial encounter: Secondary | ICD-10-CM | POA: Diagnosis not present

## 2019-08-24 DIAGNOSIS — I959 Hypotension, unspecified: Secondary | ICD-10-CM | POA: Diagnosis not present

## 2019-08-24 DIAGNOSIS — K5900 Constipation, unspecified: Secondary | ICD-10-CM | POA: Diagnosis not present

## 2019-08-24 DIAGNOSIS — I132 Hypertensive heart and chronic kidney disease with heart failure and with stage 5 chronic kidney disease, or end stage renal disease: Secondary | ICD-10-CM | POA: Diagnosis not present

## 2019-08-24 DIAGNOSIS — L03115 Cellulitis of right lower limb: Secondary | ICD-10-CM | POA: Diagnosis not present

## 2019-08-24 DIAGNOSIS — D649 Anemia, unspecified: Secondary | ICD-10-CM | POA: Diagnosis not present

## 2019-08-24 DIAGNOSIS — I482 Chronic atrial fibrillation, unspecified: Secondary | ICD-10-CM | POA: Diagnosis not present

## 2019-08-24 DIAGNOSIS — R0902 Hypoxemia: Secondary | ICD-10-CM | POA: Diagnosis not present

## 2019-08-24 DIAGNOSIS — R0602 Shortness of breath: Secondary | ICD-10-CM | POA: Diagnosis not present

## 2019-08-24 DIAGNOSIS — J449 Chronic obstructive pulmonary disease, unspecified: Secondary | ICD-10-CM | POA: Diagnosis not present

## 2019-08-24 DIAGNOSIS — D62 Acute posthemorrhagic anemia: Secondary | ICD-10-CM | POA: Diagnosis not present

## 2019-08-24 DIAGNOSIS — K922 Gastrointestinal hemorrhage, unspecified: Secondary | ICD-10-CM | POA: Diagnosis not present

## 2019-08-24 DIAGNOSIS — Z7401 Bed confinement status: Secondary | ICD-10-CM | POA: Diagnosis not present

## 2019-08-24 DIAGNOSIS — I120 Hypertensive chronic kidney disease with stage 5 chronic kidney disease or end stage renal disease: Secondary | ICD-10-CM | POA: Diagnosis not present

## 2019-08-24 DIAGNOSIS — K6381 Dieulafoy lesion of intestine: Secondary | ICD-10-CM | POA: Diagnosis not present

## 2019-08-24 DIAGNOSIS — K228 Other specified diseases of esophagus: Secondary | ICD-10-CM | POA: Diagnosis not present

## 2019-08-24 DIAGNOSIS — I2510 Atherosclerotic heart disease of native coronary artery without angina pectoris: Secondary | ICD-10-CM | POA: Diagnosis not present

## 2019-08-24 DIAGNOSIS — Z992 Dependence on renal dialysis: Secondary | ICD-10-CM | POA: Diagnosis not present

## 2019-08-24 DIAGNOSIS — Z452 Encounter for adjustment and management of vascular access device: Secondary | ICD-10-CM | POA: Diagnosis not present

## 2019-08-24 DIAGNOSIS — R52 Pain, unspecified: Secondary | ICD-10-CM | POA: Diagnosis not present

## 2019-08-24 DIAGNOSIS — R918 Other nonspecific abnormal finding of lung field: Secondary | ICD-10-CM | POA: Diagnosis not present

## 2019-09-06 ENCOUNTER — Ambulatory Visit: Payer: Medicare PPO | Admitting: Cardiovascular Disease

## 2019-09-21 DEATH — deceased

## 2019-10-12 ENCOUNTER — Ambulatory Visit: Payer: Medicare PPO | Admitting: Cardiovascular Disease

## 2019-11-30 ENCOUNTER — Ambulatory Visit: Payer: Medicare PPO | Admitting: Internal Medicine
